# Patient Record
Sex: Female | Born: 1950
Health system: Southern US, Community
[De-identification: ages and names within clinical notes are randomized; demographics above are authoritative.]

## PROBLEM LIST (undated history)

## (undated) DIAGNOSIS — R7989 Other specified abnormal findings of blood chemistry: Secondary | ICD-10-CM

## (undated) DIAGNOSIS — E039 Hypothyroidism, unspecified: Secondary | ICD-10-CM

## (undated) DIAGNOSIS — R945 Abnormal results of liver function studies: Secondary | ICD-10-CM

## (undated) DIAGNOSIS — C801 Malignant (primary) neoplasm, unspecified: Secondary | ICD-10-CM

## (undated) DIAGNOSIS — Z5189 Encounter for other specified aftercare: Secondary | ICD-10-CM

## (undated) DIAGNOSIS — IMO0002 Reserved for concepts with insufficient information to code with codable children: Secondary | ICD-10-CM

## (undated) DIAGNOSIS — C4490 Unspecified malignant neoplasm of skin, unspecified: Secondary | ICD-10-CM

## (undated) DIAGNOSIS — C189 Malignant neoplasm of colon, unspecified: Secondary | ICD-10-CM

## (undated) DIAGNOSIS — I10 Essential (primary) hypertension: Secondary | ICD-10-CM

## (undated) DIAGNOSIS — K766 Portal hypertension: Secondary | ICD-10-CM

## (undated) DIAGNOSIS — M1990 Unspecified osteoarthritis, unspecified site: Secondary | ICD-10-CM

## (undated) DIAGNOSIS — R161 Splenomegaly, not elsewhere classified: Secondary | ICD-10-CM

## (undated) DIAGNOSIS — I219 Acute myocardial infarction, unspecified: Secondary | ICD-10-CM

## (undated) DIAGNOSIS — D509 Iron deficiency anemia, unspecified: Secondary | ICD-10-CM

## (undated) DIAGNOSIS — H269 Unspecified cataract: Secondary | ICD-10-CM

## (undated) DIAGNOSIS — I639 Cerebral infarction, unspecified: Secondary | ICD-10-CM

## (undated) DIAGNOSIS — K769 Liver disease, unspecified: Secondary | ICD-10-CM

## (undated) DIAGNOSIS — T7840XA Allergy, unspecified, initial encounter: Secondary | ICD-10-CM

## (undated) DIAGNOSIS — R011 Cardiac murmur, unspecified: Secondary | ICD-10-CM

## (undated) DIAGNOSIS — I8500 Esophageal varices without bleeding: Secondary | ICD-10-CM

## (undated) DIAGNOSIS — C180 Malignant neoplasm of cecum: Secondary | ICD-10-CM

## (undated) HISTORY — PX: COSMETIC SURGERY: SHX468

## (undated) HISTORY — DX: Iron deficiency anemia, unspecified: D50.9

## (undated) HISTORY — PX: COLON SURGERY: SHX602

## (undated) HISTORY — DX: Unspecified malignant neoplasm of skin, unspecified: C44.90

## (undated) HISTORY — DX: Essential (primary) hypertension: I10

## (undated) HISTORY — DX: Unspecified osteoarthritis, unspecified site: M19.90

## (undated) HISTORY — DX: Cardiac murmur, unspecified: R01.1

## (undated) HISTORY — DX: Malignant neoplasm of colon, unspecified: C18.9

## (undated) HISTORY — DX: Unspecified cataract: H26.9

## (undated) HISTORY — DX: Other specified abnormal findings of blood chemistry: R79.89

## (undated) HISTORY — DX: Portal hypertension: K76.6

## (undated) HISTORY — DX: Allergy, unspecified, initial encounter: T78.40XA

## (undated) HISTORY — DX: Malignant (primary) neoplasm, unspecified: C80.1

## (undated) HISTORY — DX: Abnormal results of liver function studies: R94.5

## (undated) HISTORY — DX: Malignant neoplasm of cecum: C18.0

## (undated) HISTORY — PX: LIVER SURGERY: SHX698

## (undated) HISTORY — PX: TIPS PROCEDURE: SHX808

## (undated) HISTORY — PX: EYE SURGERY: SHX253

## (undated) HISTORY — DX: Splenomegaly, not elsewhere classified: R16.1

## (undated) HISTORY — PX: ESOPHAGEAL VARICE LIGATION: SHX625

---

## 2002-12-26 ENCOUNTER — Encounter: Payer: Self-pay | Admitting: Gastroenterology

## 2002-12-26 ENCOUNTER — Encounter (INDEPENDENT_AMBULATORY_CARE_PROVIDER_SITE_OTHER): Payer: Self-pay | Admitting: Specialist

## 2002-12-26 ENCOUNTER — Ambulatory Visit (HOSPITAL_COMMUNITY): Admission: RE | Admit: 2002-12-26 | Discharge: 2002-12-26 | Payer: Self-pay | Admitting: Gastroenterology

## 2003-01-04 ENCOUNTER — Encounter: Payer: Self-pay | Admitting: General Surgery

## 2003-01-08 ENCOUNTER — Inpatient Hospital Stay (HOSPITAL_COMMUNITY): Admission: RE | Admit: 2003-01-08 | Discharge: 2003-01-13 | Payer: Self-pay | Admitting: General Surgery

## 2003-01-08 ENCOUNTER — Encounter (INDEPENDENT_AMBULATORY_CARE_PROVIDER_SITE_OTHER): Payer: Self-pay

## 2003-01-08 HISTORY — PX: HEMICOLECTOMY: SHX854

## 2003-02-01 ENCOUNTER — Encounter: Admission: RE | Admit: 2003-02-01 | Discharge: 2003-02-01 | Payer: Self-pay | Admitting: Oncology

## 2003-02-01 ENCOUNTER — Encounter (HOSPITAL_COMMUNITY): Payer: Self-pay | Admitting: Oncology

## 2003-02-02 ENCOUNTER — Encounter: Payer: Self-pay | Admitting: General Surgery

## 2003-02-02 ENCOUNTER — Ambulatory Visit (HOSPITAL_BASED_OUTPATIENT_CLINIC_OR_DEPARTMENT_OTHER): Admission: RE | Admit: 2003-02-02 | Discharge: 2003-02-02 | Payer: Self-pay | Admitting: General Surgery

## 2003-04-16 ENCOUNTER — Encounter (HOSPITAL_COMMUNITY): Payer: Self-pay | Admitting: Oncology

## 2003-04-16 ENCOUNTER — Ambulatory Visit (HOSPITAL_COMMUNITY): Admission: RE | Admit: 2003-04-16 | Discharge: 2003-04-16 | Payer: Self-pay | Admitting: Oncology

## 2003-06-19 ENCOUNTER — Ambulatory Visit (HOSPITAL_COMMUNITY): Admission: RE | Admit: 2003-06-19 | Discharge: 2003-06-19 | Payer: Self-pay | Admitting: Oncology

## 2003-06-19 ENCOUNTER — Encounter (HOSPITAL_COMMUNITY): Payer: Self-pay | Admitting: Oncology

## 2003-11-12 ENCOUNTER — Ambulatory Visit (HOSPITAL_COMMUNITY): Admission: RE | Admit: 2003-11-12 | Discharge: 2003-11-12 | Payer: Self-pay | Admitting: Oncology

## 2004-01-15 ENCOUNTER — Ambulatory Visit (HOSPITAL_COMMUNITY): Admission: RE | Admit: 2004-01-15 | Discharge: 2004-01-15 | Payer: Self-pay | Admitting: Gastroenterology

## 2004-01-29 ENCOUNTER — Ambulatory Visit (HOSPITAL_COMMUNITY): Admission: RE | Admit: 2004-01-29 | Discharge: 2004-01-29 | Payer: Self-pay | Admitting: Oncology

## 2004-01-29 ENCOUNTER — Encounter (INDEPENDENT_AMBULATORY_CARE_PROVIDER_SITE_OTHER): Payer: Self-pay | Admitting: Specialist

## 2004-01-30 ENCOUNTER — Ambulatory Visit (HOSPITAL_COMMUNITY): Admission: RE | Admit: 2004-01-30 | Discharge: 2004-01-30 | Payer: Self-pay | Admitting: Oncology

## 2004-02-28 ENCOUNTER — Encounter: Admission: RE | Admit: 2004-02-28 | Discharge: 2004-02-28 | Payer: Self-pay | Admitting: Oncology

## 2004-04-14 ENCOUNTER — Ambulatory Visit (HOSPITAL_COMMUNITY): Admission: RE | Admit: 2004-04-14 | Discharge: 2004-04-14 | Payer: Self-pay | Admitting: Gastroenterology

## 2004-08-11 ENCOUNTER — Encounter: Admission: RE | Admit: 2004-08-11 | Discharge: 2004-08-11 | Payer: Self-pay | Admitting: Gastroenterology

## 2004-09-26 ENCOUNTER — Ambulatory Visit: Payer: Self-pay | Admitting: Oncology

## 2004-11-18 ENCOUNTER — Ambulatory Visit (HOSPITAL_COMMUNITY): Admission: RE | Admit: 2004-11-18 | Discharge: 2004-11-18 | Payer: Self-pay | Admitting: Oncology

## 2004-12-18 ENCOUNTER — Ambulatory Visit: Payer: Self-pay | Admitting: Oncology

## 2005-02-11 ENCOUNTER — Ambulatory Visit: Payer: Self-pay | Admitting: Oncology

## 2005-04-07 ENCOUNTER — Encounter: Admission: RE | Admit: 2005-04-07 | Discharge: 2005-04-07 | Payer: Self-pay | Admitting: Oncology

## 2005-04-22 ENCOUNTER — Ambulatory Visit: Payer: Self-pay | Admitting: Oncology

## 2005-05-04 ENCOUNTER — Ambulatory Visit (HOSPITAL_COMMUNITY): Admission: RE | Admit: 2005-05-04 | Discharge: 2005-05-04 | Payer: Self-pay | Admitting: Oncology

## 2005-06-22 ENCOUNTER — Ambulatory Visit: Payer: Self-pay | Admitting: Oncology

## 2005-09-11 ENCOUNTER — Ambulatory Visit: Payer: Self-pay | Admitting: Oncology

## 2005-11-10 ENCOUNTER — Ambulatory Visit: Payer: Self-pay | Admitting: Oncology

## 2005-11-11 ENCOUNTER — Ambulatory Visit (HOSPITAL_COMMUNITY): Admission: RE | Admit: 2005-11-11 | Discharge: 2005-11-11 | Payer: Self-pay | Admitting: Oncology

## 2006-01-13 ENCOUNTER — Ambulatory Visit: Payer: Self-pay | Admitting: Oncology

## 2006-03-12 ENCOUNTER — Ambulatory Visit: Payer: Self-pay | Admitting: Oncology

## 2006-05-12 ENCOUNTER — Ambulatory Visit: Payer: Self-pay | Admitting: Oncology

## 2006-05-12 ENCOUNTER — Encounter: Admission: RE | Admit: 2006-05-12 | Discharge: 2006-05-12 | Payer: Self-pay | Admitting: Oncology

## 2006-05-14 LAB — COMPREHENSIVE METABOLIC PANEL
ALT: 26 U/L (ref 0–40)
AST: 35 U/L (ref 0–37)
Alkaline Phosphatase: 115 U/L (ref 39–117)
Calcium: 8.7 mg/dL (ref 8.4–10.5)
Chloride: 107 mEq/L (ref 96–112)
Creatinine, Ser: 0.83 mg/dL (ref 0.40–1.20)

## 2006-05-14 LAB — CBC WITH DIFFERENTIAL/PLATELET
BASO%: 0.8 % (ref 0.0–2.0)
EOS%: 2.5 % (ref 0.0–7.0)
HCT: 30.9 % — ABNORMAL LOW (ref 34.8–46.6)
MCH: 30.2 pg (ref 26.0–34.0)
MCHC: 34.1 g/dL (ref 32.0–36.0)
MONO%: 8.5 % (ref 0.0–13.0)
NEUT%: 57.9 % (ref 39.6–76.8)
RDW: 15.3 % — ABNORMAL HIGH (ref 11.3–14.5)
lymph#: 0.7 10*3/uL — ABNORMAL LOW (ref 0.9–3.3)

## 2006-05-14 LAB — LACTATE DEHYDROGENASE: LDH: 261 U/L — ABNORMAL HIGH (ref 94–250)

## 2006-05-25 ENCOUNTER — Ambulatory Visit (HOSPITAL_COMMUNITY): Admission: RE | Admit: 2006-05-25 | Discharge: 2006-05-25 | Payer: Self-pay | Admitting: Oncology

## 2006-07-09 ENCOUNTER — Ambulatory Visit: Payer: Self-pay | Admitting: Oncology

## 2006-07-29 ENCOUNTER — Inpatient Hospital Stay (HOSPITAL_COMMUNITY): Admission: EM | Admit: 2006-07-29 | Discharge: 2006-08-01 | Payer: Self-pay | Admitting: Emergency Medicine

## 2006-08-24 ENCOUNTER — Inpatient Hospital Stay (HOSPITAL_COMMUNITY): Admission: AD | Admit: 2006-08-24 | Discharge: 2006-08-27 | Payer: Self-pay | Admitting: Gastroenterology

## 2006-09-09 ENCOUNTER — Ambulatory Visit: Payer: Self-pay | Admitting: Oncology

## 2006-09-20 ENCOUNTER — Ambulatory Visit (HOSPITAL_COMMUNITY): Admission: RE | Admit: 2006-09-20 | Discharge: 2006-09-20 | Payer: Self-pay | Admitting: Gastroenterology

## 2006-10-29 ENCOUNTER — Ambulatory Visit (HOSPITAL_COMMUNITY): Admission: RE | Admit: 2006-10-29 | Discharge: 2006-10-29 | Payer: Self-pay | Admitting: Gastroenterology

## 2006-11-04 ENCOUNTER — Ambulatory Visit: Payer: Self-pay | Admitting: Oncology

## 2007-01-05 ENCOUNTER — Ambulatory Visit: Payer: Self-pay | Admitting: Oncology

## 2007-01-10 LAB — CBC WITH DIFFERENTIAL/PLATELET
Basophils Absolute: 0 10*3/uL (ref 0.0–0.1)
Eosinophils Absolute: 0.1 10*3/uL (ref 0.0–0.5)
HCT: 31.6 % — ABNORMAL LOW (ref 34.8–46.6)
HGB: 10.8 g/dL — ABNORMAL LOW (ref 11.6–15.9)
MONO#: 0.2 10*3/uL (ref 0.1–0.9)
NEUT#: 1.4 10*3/uL — ABNORMAL LOW (ref 1.5–6.5)
NEUT%: 54.4 % (ref 39.6–76.8)
RDW: 16.6 % — ABNORMAL HIGH (ref 11.3–14.5)
WBC: 2.5 10*3/uL — ABNORMAL LOW (ref 3.9–10.0)
lymph#: 0.9 10*3/uL (ref 0.9–3.3)

## 2007-01-10 LAB — COMPREHENSIVE METABOLIC PANEL
ALT: 32 U/L (ref 0–35)
AST: 34 U/L (ref 0–37)
Albumin: 3.9 g/dL (ref 3.5–5.2)
BUN: 11 mg/dL (ref 6–23)
CO2: 26 mEq/L (ref 19–32)
Calcium: 9 mg/dL (ref 8.4–10.5)
Chloride: 108 mEq/L (ref 96–112)
Creatinine, Ser: 0.84 mg/dL (ref 0.40–1.20)
Potassium: 3.9 mEq/L (ref 3.5–5.3)

## 2007-01-10 LAB — LACTATE DEHYDROGENASE: LDH: 230 U/L (ref 94–250)

## 2007-03-02 ENCOUNTER — Ambulatory Visit: Payer: Self-pay | Admitting: Oncology

## 2007-05-12 ENCOUNTER — Ambulatory Visit: Payer: Self-pay | Admitting: Oncology

## 2007-05-16 LAB — COMPREHENSIVE METABOLIC PANEL
AST: 31 U/L (ref 0–37)
Albumin: 4.2 g/dL (ref 3.5–5.2)
Alkaline Phosphatase: 91 U/L (ref 39–117)
BUN: 17 mg/dL (ref 6–23)
Chloride: 106 mEq/L (ref 96–112)
Creatinine, Ser: 0.79 mg/dL (ref 0.40–1.20)
Glucose, Bld: 68 mg/dL — ABNORMAL LOW (ref 70–99)
Total Bilirubin: 0.6 mg/dL (ref 0.3–1.2)

## 2007-05-16 LAB — CBC WITH DIFFERENTIAL/PLATELET
BASO%: 0.6 % (ref 0.0–2.0)
Basophils Absolute: 0 10*3/uL (ref 0.0–0.1)
EOS%: 3.3 % (ref 0.0–7.0)
Eosinophils Absolute: 0.1 10*3/uL (ref 0.0–0.5)
HGB: 12.1 g/dL (ref 11.6–15.9)
LYMPH%: 29.6 % (ref 14.0–48.0)
MCH: 31.5 pg (ref 26.0–34.0)
MCHC: 34.2 g/dL (ref 32.0–36.0)
MCV: 92 fL (ref 81.0–101.0)
MONO%: 6.8 % (ref 0.0–13.0)
NEUT#: 1.4 10*3/uL — ABNORMAL LOW (ref 1.5–6.5)
NEUT%: 59.7 % (ref 39.6–76.8)
Platelets: 122 10*3/uL — ABNORMAL LOW (ref 145–400)
RBC: 3.86 10*6/uL (ref 3.70–5.32)
lymph#: 0.7 10*3/uL — ABNORMAL LOW (ref 0.9–3.3)

## 2007-05-16 LAB — CEA: CEA: 2.2 ng/mL (ref 0.0–5.0)

## 2007-06-06 ENCOUNTER — Ambulatory Visit (HOSPITAL_COMMUNITY): Admission: RE | Admit: 2007-06-06 | Discharge: 2007-06-06 | Payer: Self-pay | Admitting: Oncology

## 2007-06-07 ENCOUNTER — Encounter: Admission: RE | Admit: 2007-06-07 | Discharge: 2007-06-07 | Payer: Self-pay | Admitting: Oncology

## 2007-07-01 ENCOUNTER — Ambulatory Visit: Payer: Self-pay | Admitting: Oncology

## 2007-09-15 ENCOUNTER — Ambulatory Visit: Payer: Self-pay | Admitting: Oncology

## 2007-09-19 LAB — CEA: CEA: 2.7 ng/mL (ref 0.0–5.0)

## 2007-09-19 LAB — CBC WITH DIFFERENTIAL/PLATELET
BASO%: 0.8 % (ref 0.0–2.0)
Eosinophils Absolute: 0.1 10*3/uL (ref 0.0–0.5)
HCT: 33.1 % — ABNORMAL LOW (ref 34.8–46.6)
MCHC: 35.3 g/dL (ref 32.0–36.0)
MONO#: 0.2 10*3/uL (ref 0.1–0.9)
NEUT#: 1.3 10*3/uL — ABNORMAL LOW (ref 1.5–6.5)
NEUT%: 57.6 % (ref 39.6–76.8)
WBC: 2.2 10*3/uL — ABNORMAL LOW (ref 3.9–10.0)
lymph#: 0.7 10*3/uL — ABNORMAL LOW (ref 0.9–3.3)

## 2007-09-19 LAB — COMPREHENSIVE METABOLIC PANEL
ALT: 26 U/L (ref 0–35)
CO2: 29 mEq/L (ref 19–32)
Calcium: 8.9 mg/dL (ref 8.4–10.5)
Chloride: 105 mEq/L (ref 96–112)
Creatinine, Ser: 0.86 mg/dL (ref 0.40–1.20)
Glucose, Bld: 74 mg/dL (ref 70–99)
Sodium: 142 mEq/L (ref 135–145)
Total Protein: 7.3 g/dL (ref 6.0–8.3)

## 2007-09-19 LAB — LACTATE DEHYDROGENASE: LDH: 203 U/L (ref 94–250)

## 2007-11-14 ENCOUNTER — Ambulatory Visit (HOSPITAL_COMMUNITY): Admission: RE | Admit: 2007-11-14 | Discharge: 2007-11-14 | Payer: Self-pay | Admitting: General Surgery

## 2008-01-23 DIAGNOSIS — K766 Portal hypertension: Secondary | ICD-10-CM | POA: Insufficient documentation

## 2008-03-14 ENCOUNTER — Ambulatory Visit: Payer: Self-pay | Admitting: Oncology

## 2008-03-19 LAB — CEA: CEA: 2.1 ng/mL (ref 0.0–5.0)

## 2008-03-19 LAB — COMPREHENSIVE METABOLIC PANEL
Albumin: 4 g/dL (ref 3.5–5.2)
CO2: 26 mEq/L (ref 19–32)
Glucose, Bld: 83 mg/dL (ref 70–99)
Potassium: 3.9 mEq/L (ref 3.5–5.3)
Sodium: 140 mEq/L (ref 135–145)
Total Protein: 7.2 g/dL (ref 6.0–8.3)

## 2008-03-19 LAB — CBC WITH DIFFERENTIAL/PLATELET
Eosinophils Absolute: 0.1 10*3/uL (ref 0.0–0.5)
MONO#: 0.2 10*3/uL (ref 0.1–0.9)
NEUT#: 1.5 10*3/uL (ref 1.5–6.5)
Platelets: 105 10*3/uL — ABNORMAL LOW (ref 145–400)
RBC: 3.62 10*6/uL — ABNORMAL LOW (ref 3.70–5.32)
RDW: 14.9 % — ABNORMAL HIGH (ref 11.3–14.5)
WBC: 2.5 10*3/uL — ABNORMAL LOW (ref 3.9–10.0)

## 2008-03-19 LAB — LACTATE DEHYDROGENASE: LDH: 200 U/L (ref 94–250)

## 2008-06-05 ENCOUNTER — Ambulatory Visit (HOSPITAL_COMMUNITY): Admission: RE | Admit: 2008-06-05 | Discharge: 2008-06-05 | Payer: Self-pay | Admitting: Oncology

## 2008-06-13 ENCOUNTER — Encounter: Admission: RE | Admit: 2008-06-13 | Discharge: 2008-06-13 | Payer: Self-pay | Admitting: Obstetrics and Gynecology

## 2008-07-02 ENCOUNTER — Ambulatory Visit (HOSPITAL_COMMUNITY): Admission: RE | Admit: 2008-07-02 | Discharge: 2008-07-02 | Payer: Self-pay | Admitting: Gastroenterology

## 2008-10-12 ENCOUNTER — Ambulatory Visit: Payer: Self-pay | Admitting: Oncology

## 2008-10-15 LAB — COMPREHENSIVE METABOLIC PANEL
Albumin: 4.1 g/dL (ref 3.5–5.2)
Alkaline Phosphatase: 105 U/L (ref 39–117)
BUN: 13 mg/dL (ref 6–23)
Calcium: 8.8 mg/dL (ref 8.4–10.5)
Glucose, Bld: 72 mg/dL (ref 70–99)
Potassium: 3.9 mEq/L (ref 3.5–5.3)

## 2008-10-15 LAB — CBC WITH DIFFERENTIAL/PLATELET
Basophils Absolute: 0 10*3/uL (ref 0.0–0.1)
EOS%: 5.1 % (ref 0.0–7.0)
HCT: 34.5 % — ABNORMAL LOW (ref 34.8–46.6)
HGB: 11.8 g/dL (ref 11.6–15.9)
LYMPH%: 28 % (ref 14.0–48.0)
MCH: 32 pg (ref 26.0–34.0)
MCV: 93.4 fL (ref 81.0–101.0)
NEUT%: 59 % (ref 39.6–76.8)
Platelets: 100 10*3/uL — ABNORMAL LOW (ref 145–400)
lymph#: 0.6 10*3/uL — ABNORMAL LOW (ref 0.9–3.3)

## 2009-05-21 ENCOUNTER — Ambulatory Visit: Payer: Self-pay | Admitting: Oncology

## 2009-05-23 ENCOUNTER — Ambulatory Visit (HOSPITAL_COMMUNITY): Admission: RE | Admit: 2009-05-23 | Discharge: 2009-05-23 | Payer: Self-pay | Admitting: Oncology

## 2009-05-23 LAB — CBC WITH DIFFERENTIAL/PLATELET
BASO%: 0.3 % (ref 0.0–2.0)
Basophils Absolute: 0 10*3/uL (ref 0.0–0.1)
EOS%: 3.5 % (ref 0.0–7.0)
Eosinophils Absolute: 0.1 10*3/uL (ref 0.0–0.5)
HCT: 35.2 % (ref 34.8–46.6)
HGB: 12.2 g/dL (ref 11.6–15.9)
LYMPH%: 28.9 % (ref 14.0–49.7)
MCH: 32.2 pg (ref 25.1–34.0)
MCHC: 34.6 g/dL (ref 31.5–36.0)
MCV: 93 fL (ref 79.5–101.0)
MONO#: 0.1 10*3/uL (ref 0.1–0.9)
MONO%: 7.3 % (ref 0.0–14.0)
NEUT#: 1.1 10*3/uL — ABNORMAL LOW (ref 1.5–6.5)
NEUT%: 60 % (ref 38.4–76.8)
Platelets: 94 10*3/uL — ABNORMAL LOW (ref 145–400)
RBC: 3.78 10*6/uL (ref 3.70–5.45)
RDW: 14.9 % — ABNORMAL HIGH (ref 11.2–14.5)
WBC: 1.9 10*3/uL — ABNORMAL LOW (ref 3.9–10.3)
lymph#: 0.5 10*3/uL — ABNORMAL LOW (ref 0.9–3.3)

## 2009-05-23 LAB — COMPREHENSIVE METABOLIC PANEL
ALT: 21 U/L (ref 0–35)
AST: 30 U/L (ref 0–37)
Alkaline Phosphatase: 83 U/L (ref 39–117)
Potassium: 3.9 mEq/L (ref 3.5–5.3)
Sodium: 141 mEq/L (ref 135–145)
Total Bilirubin: 0.7 mg/dL (ref 0.3–1.2)
Total Protein: 7.3 g/dL (ref 6.0–8.3)

## 2009-05-23 LAB — CEA: CEA: 2.7 ng/mL (ref 0.0–5.0)

## 2009-06-26 ENCOUNTER — Ambulatory Visit (HOSPITAL_COMMUNITY): Admission: RE | Admit: 2009-06-26 | Discharge: 2009-06-26 | Payer: Self-pay | Admitting: Gastroenterology

## 2009-07-16 ENCOUNTER — Encounter: Admission: RE | Admit: 2009-07-16 | Discharge: 2009-07-16 | Payer: Self-pay | Admitting: Oncology

## 2009-11-25 ENCOUNTER — Ambulatory Visit: Payer: Self-pay | Admitting: Oncology

## 2009-11-28 LAB — CBC WITH DIFFERENTIAL/PLATELET
BASO%: 0.7 % (ref 0.0–2.0)
Basophils Absolute: 0 10*3/uL (ref 0.0–0.1)
HCT: 33.6 % — ABNORMAL LOW (ref 34.8–46.6)
HGB: 11.3 g/dL — ABNORMAL LOW (ref 11.6–15.9)
MONO#: 0.2 10*3/uL (ref 0.1–0.9)
NEUT#: 1.1 10*3/uL — ABNORMAL LOW (ref 1.5–6.5)
NEUT%: 58.5 % (ref 38.4–76.8)
RDW: 14.5 % (ref 11.2–14.5)
WBC: 2 10*3/uL — ABNORMAL LOW (ref 3.9–10.3)
lymph#: 0.6 10*3/uL — ABNORMAL LOW (ref 0.9–3.3)

## 2009-11-28 LAB — COMPREHENSIVE METABOLIC PANEL
ALT: 24 U/L (ref 0–35)
AST: 37 U/L (ref 0–37)
Albumin: 3.7 g/dL (ref 3.5–5.2)
BUN: 11 mg/dL (ref 6–23)
CO2: 29 mEq/L (ref 19–32)
Calcium: 9 mg/dL (ref 8.4–10.5)
Chloride: 107 mEq/L (ref 96–112)
Creatinine, Ser: 0.85 mg/dL (ref 0.40–1.20)
Potassium: 3.5 mEq/L (ref 3.5–5.3)

## 2009-11-28 LAB — LACTATE DEHYDROGENASE: LDH: 221 U/L (ref 94–250)

## 2010-05-27 ENCOUNTER — Ambulatory Visit: Payer: Self-pay | Admitting: Oncology

## 2010-05-29 LAB — CBC WITH DIFFERENTIAL/PLATELET
Basophils Absolute: 0 10*3/uL (ref 0.0–0.1)
EOS%: 3.2 % (ref 0.0–7.0)
HCT: 33.2 % — ABNORMAL LOW (ref 34.8–46.6)
HGB: 11.4 g/dL — ABNORMAL LOW (ref 11.6–15.9)
MCH: 32.3 pg (ref 25.1–34.0)
MONO#: 0.1 10*3/uL (ref 0.1–0.9)
NEUT#: 1.2 10*3/uL — ABNORMAL LOW (ref 1.5–6.5)
NEUT%: 66.9 % (ref 38.4–76.8)
RDW: 15.4 % — ABNORMAL HIGH (ref 11.2–14.5)
WBC: 1.7 10*3/uL — ABNORMAL LOW (ref 3.9–10.3)
lymph#: 0.4 10*3/uL — ABNORMAL LOW (ref 0.9–3.3)

## 2010-05-29 LAB — COMPREHENSIVE METABOLIC PANEL
ALT: 16 U/L (ref 0–35)
AST: 25 U/L (ref 0–37)
Albumin: 4 g/dL (ref 3.5–5.2)
BUN: 12 mg/dL (ref 6–23)
CO2: 22 mEq/L (ref 19–32)
Calcium: 8.8 mg/dL (ref 8.4–10.5)
Chloride: 108 mEq/L (ref 96–112)
Creatinine, Ser: 0.81 mg/dL (ref 0.40–1.20)
Potassium: 3.8 mEq/L (ref 3.5–5.3)

## 2010-05-29 LAB — LACTATE DEHYDROGENASE: LDH: 168 U/L (ref 94–250)

## 2010-05-30 ENCOUNTER — Ambulatory Visit (HOSPITAL_COMMUNITY): Admission: RE | Admit: 2010-05-30 | Discharge: 2010-05-30 | Payer: Self-pay | Admitting: Oncology

## 2010-06-12 DIAGNOSIS — I8500 Esophageal varices without bleeding: Secondary | ICD-10-CM | POA: Insufficient documentation

## 2010-07-17 ENCOUNTER — Encounter: Admission: RE | Admit: 2010-07-17 | Discharge: 2010-07-17 | Payer: Self-pay | Admitting: Oncology

## 2010-11-26 ENCOUNTER — Ambulatory Visit: Payer: Self-pay | Admitting: Oncology

## 2010-11-27 LAB — CBC WITH DIFFERENTIAL/PLATELET
BASO%: 0.5 % (ref 0.0–2.0)
Basophils Absolute: 0 10*3/uL (ref 0.0–0.1)
EOS%: 3.4 % (ref 0.0–7.0)
Eosinophils Absolute: 0.1 10*3/uL (ref 0.0–0.5)
HCT: 32.9 % — ABNORMAL LOW (ref 34.8–46.6)
HGB: 11.1 g/dL — ABNORMAL LOW (ref 11.6–15.9)
LYMPH%: 27.2 % (ref 14.0–49.7)
MCH: 31.8 pg (ref 25.1–34.0)
MCHC: 33.9 g/dL (ref 31.5–36.0)
MCV: 93.9 fL (ref 79.5–101.0)
MONO#: 0.1 10*3/uL (ref 0.1–0.9)
MONO%: 6.9 % (ref 0.0–14.0)
NEUT#: 1.2 10*3/uL — ABNORMAL LOW (ref 1.5–6.5)
NEUT%: 62 % (ref 38.4–76.8)
Platelets: 82 10*3/uL — ABNORMAL LOW (ref 145–400)
RBC: 3.5 10*6/uL — ABNORMAL LOW (ref 3.70–5.45)
RDW: 14.9 % — ABNORMAL HIGH (ref 11.2–14.5)
WBC: 1.9 10*3/uL — ABNORMAL LOW (ref 3.9–10.3)
lymph#: 0.5 10*3/uL — ABNORMAL LOW (ref 0.9–3.3)

## 2010-11-27 LAB — COMPREHENSIVE METABOLIC PANEL
ALT: 29 U/L (ref 0–35)
AST: 38 U/L — ABNORMAL HIGH (ref 0–37)
Albumin: 3.6 g/dL (ref 3.5–5.2)
Alkaline Phosphatase: 83 U/L (ref 39–117)
BUN: 12 mg/dL (ref 6–23)
CO2: 29 mEq/L (ref 19–32)
Calcium: 9 mg/dL (ref 8.4–10.5)
Chloride: 107 mEq/L (ref 96–112)
Creatinine, Ser: 1.01 mg/dL (ref 0.40–1.20)
Glucose, Bld: 92 mg/dL (ref 70–99)
Potassium: 4 mEq/L (ref 3.5–5.3)
Sodium: 142 mEq/L (ref 135–145)
Total Bilirubin: 0.7 mg/dL (ref 0.3–1.2)
Total Protein: 7.2 g/dL (ref 6.0–8.3)

## 2010-11-27 LAB — CEA: CEA: 2.5 ng/mL (ref 0.0–5.0)

## 2010-11-27 LAB — LACTATE DEHYDROGENASE: LDH: 210 U/L (ref 94–250)

## 2010-12-19 LAB — BASIC METABOLIC PANEL
Calcium: 8.5 mg/dL (ref 8.4–10.5)
Creatinine, Ser: 1.2 mg/dL (ref 0.4–1.2)
GFR calc Af Amer: 56 mL/min — ABNORMAL LOW (ref >60)
GFR calc non Af Amer: 46 mL/min — ABNORMAL LOW (ref >60)
Sodium: 125 mEq/L — ABNORMAL LOW (ref 135–145)

## 2010-12-19 LAB — BLOOD GAS, ARTERIAL
Acid-base deficit: 2 mmol/L (ref 0.0–2.0)
Drawn by: 229971
O2 Content: 2 L/min
pCO2 arterial: 24.8 mmHg — ABNORMAL LOW (ref 35.0–45.0)
pH, Arterial: 7.509 — ABNORMAL HIGH (ref 7.350–7.400)
pO2, Arterial: 65.7 mmHg — ABNORMAL LOW (ref 80.0–100.0)

## 2010-12-19 LAB — DIFFERENTIAL
Basophils Relative: 0 % (ref 0–1)
Eosinophils Absolute: 0 10*3/uL (ref 0.0–0.7)
Eosinophils Relative: 0 % (ref 0–5)
Lymphocytes Relative: 3 % — ABNORMAL LOW (ref 12–46)
Neutrophils Relative %: 92 % — ABNORMAL HIGH (ref 43–77)

## 2010-12-19 LAB — CBC
Hemoglobin: 15.8 g/dL — ABNORMAL HIGH (ref 12.0–15.0)
MCH: 31.3 pg (ref 26.0–34.0)
Platelets: 68 10*3/uL — ABNORMAL LOW (ref 150–400)
RBC: 5.04 MIL/uL (ref 3.87–5.11)
WBC: 6.5 10*3/uL (ref 4.0–10.5)

## 2010-12-19 LAB — PROCALCITONIN: Procalcitonin: 13.46 ng/mL

## 2010-12-20 ENCOUNTER — Inpatient Hospital Stay (HOSPITAL_COMMUNITY)
Admission: EM | Admit: 2010-12-20 | Discharge: 2010-12-25 | DRG: 089 | Disposition: A | Discharge status: Home / Self Care | Payer: BC Managed Care – HMO | Attending: Internal Medicine | Admitting: Internal Medicine

## 2010-12-20 DIAGNOSIS — D61818 Other pancytopenia: Secondary | ICD-10-CM | POA: Diagnosis present

## 2010-12-20 DIAGNOSIS — K7460 Unspecified cirrhosis of liver: Secondary | ICD-10-CM | POA: Diagnosis present

## 2010-12-20 DIAGNOSIS — R509 Fever, unspecified: Secondary | ICD-10-CM | POA: Diagnosis present

## 2010-12-20 DIAGNOSIS — E039 Hypothyroidism, unspecified: Secondary | ICD-10-CM | POA: Diagnosis present

## 2010-12-20 DIAGNOSIS — D649 Anemia, unspecified: Secondary | ICD-10-CM | POA: Diagnosis present

## 2010-12-20 DIAGNOSIS — K560 Paralytic ileus: Secondary | ICD-10-CM | POA: Diagnosis present

## 2010-12-20 DIAGNOSIS — E669 Obesity, unspecified: Secondary | ICD-10-CM | POA: Diagnosis present

## 2010-12-20 DIAGNOSIS — J189 Pneumonia, unspecified organism: Principal | ICD-10-CM | POA: Diagnosis present

## 2010-12-20 DIAGNOSIS — D6959 Other secondary thrombocytopenia: Secondary | ICD-10-CM | POA: Diagnosis present

## 2010-12-20 DIAGNOSIS — A088 Other specified intestinal infections: Secondary | ICD-10-CM | POA: Diagnosis present

## 2010-12-20 LAB — PROTIME-INR
INR: 1.44 (ref 0.00–1.49)
INR: 1.65 — ABNORMAL HIGH (ref 0.00–1.49)
Prothrombin Time: 19.7 seconds — ABNORMAL HIGH (ref 11.6–15.2)

## 2010-12-20 LAB — CREATININE, URINE, RANDOM: Creatinine, Urine: 63.1 mg/dL

## 2010-12-20 LAB — URINALYSIS, ROUTINE W REFLEX MICROSCOPIC
Ketones, ur: NEGATIVE mg/dL
Protein, ur: NEGATIVE mg/dL
Urine Glucose, Fasting: NEGATIVE mg/dL
Urobilinogen, UA: 0.2 mg/dL (ref 0.0–1.0)

## 2010-12-20 LAB — SODIUM, URINE, RANDOM: Sodium, Ur: <15 mEq/L

## 2010-12-20 LAB — MAGNESIUM: Magnesium: 2.1 mg/dL (ref 1.5–2.5)

## 2010-12-20 LAB — DIFFERENTIAL
Basophils Absolute: 0 10*3/uL (ref 0.0–0.1)
Basophils Relative: 0 % (ref 0–1)
Eosinophils Absolute: 0 10*3/uL (ref 0.0–0.7)
Lymphocytes Relative: 3 % — ABNORMAL LOW (ref 12–46)
Lymphs Abs: 0.3 10*3/uL — ABNORMAL LOW (ref 0.7–4.0)
Monocytes Absolute: 0.7 10*3/uL (ref 0.1–1.0)
Neutro Abs: 7.5 10*3/uL (ref 1.7–7.7)

## 2010-12-20 LAB — PHOSPHORUS: Phosphorus: 2.4 mg/dL (ref 2.3–4.6)

## 2010-12-20 LAB — COMPREHENSIVE METABOLIC PANEL
ALT: 16 U/L (ref 0–35)
BUN: 19 mg/dL (ref 6–23)
CO2: 23 mEq/L (ref 19–32)
Calcium: 8.2 mg/dL — ABNORMAL LOW (ref 8.4–10.5)
Creatinine, Ser: 1.07 mg/dL (ref 0.4–1.2)
GFR calc non Af Amer: 52 mL/min — ABNORMAL LOW (ref >60)
Glucose, Bld: 103 mg/dL — ABNORMAL HIGH (ref 70–99)
Sodium: 130 mEq/L — ABNORMAL LOW (ref 135–145)
Total Protein: 6.2 g/dL (ref 6.0–8.3)

## 2010-12-20 LAB — URINE MICROSCOPIC-ADD ON

## 2010-12-20 LAB — FECAL LACTOFERRIN, QUANT: Fecal Lactoferrin: POSITIVE

## 2010-12-20 LAB — APTT: aPTT: 39 seconds — ABNORMAL HIGH (ref 24–37)

## 2010-12-20 LAB — CBC
Hemoglobin: 10.4 g/dL — ABNORMAL LOW (ref 12.0–15.0)
MCH: 30.9 pg (ref 26.0–34.0)
MCHC: 35.3 g/dL (ref 30.0–36.0)
Platelets: 73 10*3/uL — ABNORMAL LOW (ref 150–400)
RDW: 14.1 % (ref 11.5–15.5)

## 2010-12-20 LAB — MRSA PCR SCREENING: MRSA by PCR: NEGATIVE

## 2010-12-20 NOTE — H&P (Signed)
[name], [name]                  ACCOUNT NO.:  [id number]  MEDICAL RECORD NO.:  [id number]          PATIENT TYPE:  EMS  LOCATION:  ED                           FACILITY:  [facility]  PHYSICIAN:  Michiel Cowboy, MDDATE OF BIRTH:  1951-04-17  DATE OF ADMISSION:  12/19/2010 DATE OF DISCHARGE:                             HISTORY & PHYSICAL   PRIMARY. CARE PROVIDER:  Currently none but the patient has oncologist Dr. Arline Asp and hepatologist Dr. Agustin Cree at [facility].  CHIEF COMPLAINT:  Nausea, vomiting, fever, cough.  HISTORY OF PRESENT ILLNESS:  The patient is a 60 year old female with history of colon cancer in 2004, status post chemotherapy and I believe also resection  resulting in cirrhosis and chronic thrombocytopenia, all thought to be related to her chemotherapy.  The patient seems to have been free of cancer per her.  She was at her baseline of health about 2 weeks ago when she started developing cold-like symptoms of cough, runny nose or throat.  This lasted for few days and started to improve.  Then she developed nausea.  She was evaluated for this at the urgent care, was given something for nausea and went home and started to develop diarrhea which has continued and low-grade fevers on and off.  She came back to urgent care and had another x-ray done which was suspicious for pneumonia at which point we send her to emergency department.  In ED, a chest x-ray was done which showed left lower lobe pneumonia at which point [facility] was called for admission.  The patient is currently feeling somewhat better but still has some shortness of breath and increase oxygen requirement.  She has low-grade temperature of 101.1.  Initially, her blood pressure was in 130s over 80s but now have trended down to mid 90s over 80s.  She denies having any blood in her stool.  She does endorse low-grade fevers, nausea, vomiting and diarrhea which continues and nausea and vomiting part  started to improve.  She states that her liver functions were checked at least in November.  Her platelets have been always low since her chemotherapy.  REVIEW OF SYSTEMS:  Otherwise unremarkable.  PAST MEDICAL HISTORY:  Significant for: 1. History of colon cancer in 2004, status post chemo. 2. History of cirrhosis with varices, status post multiple banding     procedures in the past, last one was apparently done in August     2010. 3. History of thrombocytopenia which is chronic.  SOCIAL HISTORY:  The patient never smoked or drink, does not abuse drugs.  FAMILY HISTORY:  Significant for coronary artery disease in both parents who are both alive at this point.  ALLERGIES:  IV CONTRAST, causes her to have hives.  MEDICATIONS:  Propranolol 10 mg twice a day and Prilosec 20 mg daily.  PHYSICAL EXAMINATION:  VITAL SIGNS:  Temperature 101.1, blood pressure 132/81 initially and now down to 97/80, pulse initially 107 and now down to 88, respirations 18, satting 99% on room air and now 93% on 4 L. GENERAL:  The patient appears to be in no acute distress, resting in  bed. HEENT:  Head nontraumatic.  Dry mucous membranes. LUNGS:  There are bronchial air sounds on the left base.  I did not appreciate much of significant crackles.  No wheezes. HEART:  Regular rate and rhythm.  No murmurs appreciated. ABDOMEN:  Soft, slightly obese but nondistended. EXTREMITIES:  Lower extremities without clubbing, cyanosis or edema. NEUROLOGIC:  Neurologically grossly intact.  LABORATORY DATA:  White blood cell count 6.5, hemoglobin 15.8.  Her platelets are 68.  Sodium 125, potassium 2.8, creatinine 1.2.  Lactic acid 1.8.  Procalcitonin level of 13.4.  Chest x-ray done showed left lower lobe pneumonia.  ASSESSMENT AND PLAN:  This is a 60 year old female with nausea, vomiting, diarrhea and left lower lobe pneumonia, with history of cirrhosis and colon cancer: 1. Pneumonia.  We will cover Rocephin and  azithromycin, this could be     a typical pneumonia.  We will order Legionella serologies given     diarrhea. 2. Nausea, vomiting, diarrhea, could have been if this was a viral     illness to begin with which has now developed a second etiology     such as pneumonia.  She has not had any exposure to antibiotics but     since she has had a lot of exposure to medical system, we will     check for C diff.  I thought this was less likely but we will also     check stool studies.  Check a KUB. 3. Cirrhosis.  We will check LFTs.  Follow up her INR, platelets and     sodium. 4. Thrombocytopenia.  Her platelets last time were checked in October     2007 here in the system that I could see, I am sure she had had     other counts done.  Unfortunately I do not have access to any other     information at this point.  Continue to follow for now.  The     patient is aware that she has history of thrombocytopenia. 5. Low sodium.  This could be part of dehydration but also low sodium     in case of cirrhosis is a possibility as well.  Normally her sodium     has been actually fairly good but again I have to rely on numbers     from December 2008.  We will follow sodium levels to make sure we     do not overcorrect too rapidly.  We will also obtain urine and     lytes.  At this point, I really think that this was all because of     dehydration. 6. Low potassium, likely related to her recent diarrhea.  We will     replace. 7. Prophylaxis.  SCDs and Protonix.     Michiel Cowboy, MD     AVD/MEDQ  D:  12/19/2010  T:  12/20/2010  Job:  [id number]  cc:   Samul Dada, M.D. Fax: [phone]  Electronically Signed by Therisa Doyne MD on 12/20/2010 06:25:58 AM

## 2010-12-21 LAB — BASIC METABOLIC PANEL
BUN: 10 mg/dL (ref 6–23)
BUN: 9 mg/dL (ref 6–23)
Calcium: 7.7 mg/dL — ABNORMAL LOW (ref 8.4–10.5)
Chloride: 107 mEq/L (ref 96–112)
Creatinine, Ser: 0.78 mg/dL (ref 0.4–1.2)
Creatinine, Ser: 0.77 mg/dL (ref 0.4–1.2)
GFR calc non Af Amer: >60 mL/min (ref >60)
Glucose, Bld: 94 mg/dL (ref 70–99)
Glucose, Bld: 114 mg/dL — ABNORMAL HIGH (ref 70–99)

## 2010-12-21 LAB — URINE CULTURE: Culture: NO GROWTH

## 2010-12-21 LAB — CBC
HCT: 26.7 % — ABNORMAL LOW (ref 36.0–46.0)
MCHC: 34.5 g/dL (ref 30.0–36.0)
MCV: 88.4 fL (ref 78.0–100.0)
Platelets: 90 10*3/uL — ABNORMAL LOW (ref 150–400)
RDW: 14.7 % (ref 11.5–15.5)

## 2010-12-21 LAB — FERRITIN: Ferritin: 565 ng/mL — ABNORMAL HIGH (ref 10–291)

## 2010-12-21 LAB — OSMOLALITY, URINE: Osmolality, Ur: 261 mOsm/kg — ABNORMAL LOW (ref 390–1090)

## 2010-12-21 LAB — VITAMIN B12: Vitamin B-12: >2000 pg/mL — ABNORMAL HIGH (ref 211–911)

## 2010-12-21 LAB — IRON AND TIBC: TIBC: 148 ug/dL — ABNORMAL LOW (ref 250–470)

## 2010-12-22 LAB — CBC
Hemoglobin: 9.9 g/dL — ABNORMAL LOW (ref 12.0–15.0)
Platelets: 129 10*3/uL — ABNORMAL LOW (ref 150–400)
RBC: 3.21 MIL/uL — ABNORMAL LOW (ref 3.87–5.11)
WBC: 4.7 10*3/uL (ref 4.0–10.5)

## 2010-12-22 LAB — BASIC METABOLIC PANEL
CO2: 21 mEq/L (ref 19–32)
Calcium: 7.9 mg/dL — ABNORMAL LOW (ref 8.4–10.5)
Creatinine, Ser: 0.76 mg/dL (ref 0.4–1.2)
GFR calc non Af Amer: >60 mL/min (ref >60)
Glucose, Bld: 122 mg/dL — ABNORMAL HIGH (ref 70–99)
Sodium: 133 mEq/L — ABNORMAL LOW (ref 135–145)

## 2010-12-22 LAB — BRAIN NATRIURETIC PEPTIDE: Pro B Natriuretic peptide (BNP): <30 pg/mL (ref 0.0–100.0)

## 2010-12-23 LAB — STOOL CULTURE

## 2010-12-23 LAB — BASIC METABOLIC PANEL
CO2: 21 mEq/L (ref 19–32)
Calcium: 7.8 mg/dL — ABNORMAL LOW (ref 8.4–10.5)
Creatinine, Ser: 0.67 mg/dL (ref 0.4–1.2)
GFR calc Af Amer: >60 mL/min (ref >60)
GFR calc non Af Amer: >60 mL/min (ref >60)
Sodium: 135 mEq/L (ref 135–145)

## 2010-12-23 LAB — CBC
Hemoglobin: 9.2 g/dL — ABNORMAL LOW (ref 12.0–15.0)
MCH: 31.1 pg (ref 26.0–34.0)
MCHC: 34.7 g/dL (ref 30.0–36.0)
Platelets: 147 10*3/uL — ABNORMAL LOW (ref 150–400)
RDW: 15.1 % (ref 11.5–15.5)

## 2010-12-24 ENCOUNTER — Inpatient Hospital Stay (HOSPITAL_COMMUNITY): Payer: BC Managed Care – HMO

## 2010-12-24 LAB — BASIC METABOLIC PANEL
Calcium: 8.1 mg/dL — ABNORMAL LOW (ref 8.4–10.5)
GFR calc Af Amer: >60 mL/min (ref >60)
GFR calc non Af Amer: >60 mL/min (ref >60)
Glucose, Bld: 92 mg/dL (ref 70–99)
Potassium: 4 mEq/L (ref 3.5–5.1)
Sodium: 138 mEq/L (ref 135–145)

## 2010-12-24 LAB — LEGIONELLA PNEUMOPHILA ANTIBODIES: Legionella pneumo, IgG Type 1-6: <1:128 {titer}

## 2010-12-25 ENCOUNTER — Inpatient Hospital Stay (HOSPITAL_COMMUNITY): Payer: BC Managed Care – HMO

## 2010-12-25 LAB — BRAIN NATRIURETIC PEPTIDE: Pro B Natriuretic peptide (BNP): 32.1 pg/mL (ref 0.0–100.0)

## 2010-12-25 LAB — BASIC METABOLIC PANEL
CO2: 23 mEq/L (ref 19–32)
Chloride: 108 mEq/L (ref 96–112)
GFR calc Af Amer: >60 mL/min (ref >60)
Potassium: 3.9 mEq/L (ref 3.5–5.1)
Sodium: 138 mEq/L (ref 135–145)

## 2010-12-25 LAB — CBC
Hemoglobin: 9.5 g/dL — ABNORMAL LOW (ref 12.0–15.0)
MCH: 30.4 pg (ref 26.0–34.0)
Platelets: 195 10*3/uL (ref 150–400)
RBC: 3.13 MIL/uL — ABNORMAL LOW (ref 3.87–5.11)
WBC: 3.3 10*3/uL — ABNORMAL LOW (ref 4.0–10.5)

## 2010-12-25 LAB — T4, FREE: Free T4: 0.48 ng/dL — ABNORMAL LOW (ref 0.80–1.80)

## 2010-12-26 LAB — CULTURE, BLOOD (ROUTINE X 2)
Culture  Setup Time: 201201280110
Culture: NO GROWTH

## 2010-12-27 NOTE — Discharge Summary (Signed)
[name], [name]                  ACCOUNT NO.:  [id number]  MEDICAL RECORD NO.:  [id number]           PATIENT TYPE:  I  LOCATION:  1440                         FACILITY:  [facility]  PHYSICIAN:  Hillery Aldo, M.D.   DATE OF BIRTH:  [birth year]  DATE OF ADMISSION:  12/19/2010 DATE OF DISCHARGE:  12/25/2010                              DISCHARGE SUMMARY   PRIMARY CARE PHYSICIAN:  None.  ONCOLOGIST:  Samul Dada, M.D.  HEPATOLOGIST:  Dr. Agustin Cree at [facility].  DISCHARGE DIAGNOSES: 1. Left lower lobe community acquired pneumonia. 2. Gastroenteritis. 3. Ileus, resolved. 4. History of cirrhosis. 5. Chronic pancytopenia secondary to cirrhosis. 6. Obesity. 7. Newly diagnosed hypothyroidism  DISCHARGE MEDICATIONS: 1. Avelox 400 mg p.o. daily. 2. Prilosec 20 mg p.o. daily. 3. Propranolol 10 mg p.o. q.a.m., 20 mg p.o. q.h.s. 4. Synthroid 50 mcg p.o. daily  CONSULTATIONS:  None.  BRIEF ADMISSION HISTORY OF PRESENT ILLNESS:  The patient is a 59 year old female who presented to the hospital with a chief complaint of nausea, vomiting, fever and cough.  Upon initial evaluation in the emergency department, she was found to have a left lower lobe pneumonia and subsequently referred for inpatient management.  For the full details, please see the dictated report done by Dr. Adela Glimpse.  PROCEDURES AND DIAGNOSTIC STUDIES: 1. Chest x-ray on December 19, 2010, showed left lower lobe pneumonia     and parapneumonic effusion. 2. V/Q scan on December 20, 2010, showed a matched perfusion     ventilation defect in the left mid to lower lung corresponding to     the opacity seen on chest radiograph.  This was felt to be     intermediate probability for pulmonary embolism. 3. KUB on December 20, 2010, showed small loops of bowel with upper     limits of normal in size with gas seen in the colon. 4. Chest x-ray on December 20, 2010, showed stable left lower lobe     pneumonia and left effusion. 5. KUB  on December 21, 2010, showed an ileus with increasing distention     of small bowel. 6. KUB on December 22, 2010, showed persistent but improved colonic     gaseous distention, possibly representing ileus. 7. KUB on December 23, 2010, showed further improvement in mild colonic     ileus. 8. KUB on December 24, 2010, showed continued improvement in ileus     pattern. 9. Chest x-ray on December 25, 2010, showed increased density in the     left hemithorax suggesting worsening pleural effusion and/or     airspace disease with low lung volumes.  DISCHARGE LABORATORY VALUES:  BNP was 32.1.  Sodium 138, potassium 3.9, chloride 108, bicarb 23, BUN 3, creatinine 0.67, glucose 85, calcium 8.2.  White blood cell count was 3.3, hemoglobin 9.5, hematocrit 28.5, platelets 195.  Legionella pneumophila antigen IgG was negative.  Stool cultures showed reduced normal flora.  Urine cultures were negative. TSH was 16.305 with free T4 studies pending at the time of this dictation.  HOSPITAL COURSE BY PROBLEM: 1. Left lower lobe community-acquired pneumonia:  The patient  was     initially put on Zosyn to cover both pneumonia and possible     colitis.  She was switched over to p.o. Avelox on December 24, 2010.     Continued to have evidence of clinical improvement.  At this point,     she will need close followup to ensure radiographic clearing and     will complete an additional 5 days of Avelox for total antibiotic     course of 10 days. 2. Ileus/gastroenteritis:  The patient likely had a viral     gastroenteritis.  Once her symptoms resolved, the Zosyn was     discontinued and serial KUB radiographs were done which documented     improvement over time. 3. History of cirrhosis/chronic pancytopenia:  The patient will follow     up with her hepatologist at [facility]. 4. Hypothyroidism:  The patient did have a screening TSH.  Free T4 studies     were done which confirmed hypothyroidism.  The patient was placed on  synthroid    replacement therapy and has been advised to follow up with one of her    treating physicians for a follow up check of her thyroid function in 6 weeks.  DISPOSITION:  The patient is medically stable and will be discharged home.  Time spent coordinating care for discharge and discharge instructions including face-to-face time equals 35 minutes.     Hillery Aldo, M.D.     CR/MEDQ  D:  12/25/2010  T:  12/25/2010  Job:  [id number]  cc:   Samul Dada, M.D. Fax: [phone]  Dr. Agustin Cree at [facility]  Electronically Signed by Hillery Aldo M.D. on 12/27/2010 01:37:43 PM

## 2011-04-07 NOTE — Op Note (Signed)
[name], [name]                  ACCOUNT NO.:  [id number]   MEDICAL RECORD NO.:  [id number]          PATIENT TYPE:  AMB   LOCATION:  ENDO                         FACILITY:  [facility]   PHYSICIAN:  James L. Malon Kindle., M.D.DATE OF BIRTH:  1951-09-23   DATE OF PROCEDURE:  07/02/2008  DATE OF DISCHARGE:                               OPERATIVE REPORT   PROCEDURE:  Esophagogastroduodenoscopy and biopsy.   MEDICATIONS:  Cetacaine spray, fentanyl 50 mcg, Versed 6 mg IV.   INDICATION:  A very nice 60 year old woman with cryptogenic cirrhosis,  portal hypertension and a history of variceal bleeding.  She had a right  hemicolectomy for stage III-B colon cancer and underwent chemotherapy,  this was about 6 years ago.  She is been followed by Dr. Woodfin Ganja at  [facility].  She is being worked up for a possible liver  transplantation in the future.  She has had this variceal bleed since  undergoing at least 3-4 banding procedures on her esophagus.  This is  done to evaluate the status of her esophageal and gastric varices.   DESCRIPTION OF PROCEDURE:  The procedure had been explained to the  patient and consent obtained.  The plan was not to do banding at this  time, but simply to evaluate the status of the varices endoscopically.  The scope was passed blindly with agglutination and the esophagus was  entered.  We carefully washed and irrigated the esophagus.  There were  varices in the mid to distal esophagus, however, right above the GE  junction, very few varices were seen.  This area was scarred from  previous banding and some of the variceal bulges that were seen did  nearly flatten out completely with air insufflation.  The stomach was  entered, and upon arriving at the antrum there was marked erosive  gastritis.  The mucosa was friable.  There may have been some portal  gastropathy throughout the stomach.  The duodenum was entered.  The  second duodenum was seen.  There was a  prominent ampulla.  There was no  duodenitis or duodenal ulceration.  The scope was withdrawn back into  the antrum.  Initial findings were confirmed.  A biopsy was obtained for  rapid urease test for Helicobacter.  The mucosa was quite friable.  We  then placed the scope in the retroflexed view.  The patient was seen to  have quite prominent gastric varices.  These were photographed.  The  scope was withdrawn.  Initial findings in the esophagus were confirmed.  She tolerated the procedure well and was resting comfortably at the  termination of the procedure.   ASSESSMENT:  1. Esophageal varices extending up to the mid esophagus with marked      scarring from previous banding procedures.  No signs of impending      or recent bleeding.  2. Prominent gastric varices unchanged from previous endoscopy.  3. Erosive gastropathy in the antrum and probable portal gastropathy      as well.  Test for Helicobacter obtained and pending.   PLAN:  Will  check the test for Helicobacter.  Will start her empirically  on Prilosec OTC 20 mg daily.  See her back in the office in 3 months.  She will continue to follow up with Dr. [name] at [facility].           ______________________________  [name]. Malon Kindle., M.D.     Waldron Session  D:  07/02/2008  T:  07/02/2008  Job:  [id number]   cc:   Woodfin Ganja, MD  Division of Digestive Diseases  [facility]  [place], [place]   Samul Dada, M.D.  Fax: [phone]

## 2011-04-07 NOTE — Op Note (Signed)
NAMEEVELEN, VAZGUEZ                  ACCOUNT NO.:  [id number]   MEDICAL RECORD NO.:  [id number]          PATIENT TYPE:  AMB   LOCATION:  DAY                          FACILITY:  [facility]   PHYSICIAN:  Ollen Gross. Vernell Morgans, M.D. DATE OF BIRTH:  16-Aug-1951   DATE OF PROCEDURE:  11/14/2007  DATE OF DISCHARGE:                               OPERATIVE REPORT   PREOPERATIVE DIAGNOSIS:  History of colon cancer and left subclavian  vein Port-A-Cath.   POSTOPERATIVE DIAGNOSIS:  History of colon cancer and left subclavian  vein Port-A-Cath.   PROCEDURE:  Removal of Port-A-Cath.   SURGEON:  Ollen Gross. Vernell Morgans, M.D.   ANESTHESIA:  Local with IV sedation.   PROCEDURE:  After informed consent was obtained, the patient was brought  to the operating room and placed in the supine position on the operating  table.  After adequate IV sedation had been given, the patient's left  chest was prepped with Betadine and draped in the usual sterile manner.  The area of her old incision for her port was infiltrated with 1%  lidocaine as well as with 0.25% Marcaine.  A small incision was made  through her old incision with a 15 blade knife.  This incision was  carried down through the skin and subcutaneous tissue sharply with a 15  blade knife.  Once into the subcutaneous tissue, the old port was  identified by a combination of blunt hemostat dissection and sharp  dissection with Metzenbaum scissors.  The capsule around the port was  opened sharply with Metzenbaum scissors.  Two anchoring Prolene stitches  were divided and removed.  Once this was accomplished, we were able to  push the port well out of the pocket, and then the entire port was able  to be removed without difficulty.  Pressure was held on the tract for  several minutes until the area was completely hemostatic.  The deep  layer of the wound was then closed with interrupted 3-0 Vicryl stitches,  and the skin was closed with interrupted 4-0 Monocryl  subcuticular  stitches, and Dermabond dressing was applied.   The patient tolerated the procedure well.  At the end of the case, all  needle, sponge, and instrument counts were correct.  The patient was  then awakened and taken to recovery in stable condition.      Ollen Gross. Vernell Morgans, M.D.  Electronically Signed     PST/MEDQ  D:  11/14/2007  T:  11/14/2007  Job:  [id number]

## 2011-04-07 NOTE — Op Note (Signed)
[name], [name]                  ACCOUNT NO.:  [id number]   MEDICAL RECORD NO.:  [id number]          PATIENT TYPE:  AMB   LOCATION:  ENDO                         FACILITY:  [facility]   PHYSICIAN:  James L. Malon Kindle., M.D.DATE OF BIRTH:  1951-06-08   DATE OF PROCEDURE:  06/26/2009  DATE OF DISCHARGE:                               OPERATIVE REPORT   PROCEDURE:  Esophagogastroduodenoscopy.   INDICATIONS:  The patient has a history of cirrhosis, possibly due to  chemotherapy that she received for colon cancer.  Her last endoscopy was  performed about a year ago, showing small __________ but complete  flattening of the varices with air insufflation.  This is done as a  repeat procedure.  We discussed this in detail with the patient and told  her that if felt indicated we would perform esophageal banding at this  time.   DESCRIPTION OF PROCEDURE:  Procedure had been explained to the patient  and consent obtained.   In the left lateral decubitus position the Pentax upper scope was  inserted and advanced.  The esophagus was entered.  We passed down to  the distal esophagus.  The patient was seen to have varices, but  insufflation with air completely flattened varices in the distal  esophagus.  There was marked scarring from previous bandings.  We then  slowly withdrew the scope up and there were more and more prominent  varices that were really quite prominent and did not completely  flattened in the proximal esophagus.  We passed down into the stomach  and the retroflexed view did reveal large gastric varices that were  unchanged from previous endoscopy.  There was mild portal gastropathy in  the antrum.  No ulcerations.  The duodenum, including bulb and second  portion, was seen well, was unremarkable.  Scope was withdrawn back into  the stomach and the esophagus was again carefully examined upon  withdrawal.  Everything was decompressed and there were clearly some  varices in the  distal esophagus, they did flatten completely with air  insufflation; and as we came more proximal the varices were more  prominent did not flatten as well.   At this point I made the decision, since the primary area of varices was  in the proximal esophagus, to defer banding at this time.  Scope was  withdrawn.  The patient tolerated procedure well.   ASSESSMENT:  1. Esophageal varices with the varices most prominent in the proximal      esophagus and flattened with insufflation in the distal esophagus.      At this point I would like to discuss this further; even have her      evaluated for another opinion at [facility] where she is followed      by the hepatology team, prior to initiating banding.  2. Prominent gastric varices, probably unchanged from previous      endoscopy.  3. Portal gastropathy.   PLAN:  Will discuss her case with Dr. [name] at [facility] prior to  making any final decision about banding.  May even want  to have her  examined there regarding the possibility of banding the proximal  esophageal varices.           ______________________________  [name] Malon Kindle., M.D.     Waldron Session  D:  06/26/2009  T:  06/26/2009  Job:  [id number]   cc:   Dr. Everardo Pacific  Division of Digestive Diseases  [facility]  [place], [place]   Samul Dada, M.D.  Fax: [phone]

## 2011-04-10 NOTE — Op Note (Signed)
[name], [name]                         ACCOUNT NO.:  [id number]   MEDICAL RECORD NO.:  [id number]                   PATIENT TYPE:  AMB   LOCATION:  ENDO                                 FACILITY:  [facility]   PHYSICIAN:  James L. Malon Kindle., M.D.          DATE OF BIRTH:  Jun 26, 1951   DATE OF PROCEDURE:  01/15/2004  DATE OF DISCHARGE:                                 OPERATIVE REPORT   PROCEDURE:  Colonoscopy.   MEDICATIONS:  1. Fentanyl 100 mcg.  2. Versed 12 mg IV.   INDICATION:  A 60 year old who is 1 year status post right hemicolectomy for  colon cancer.  Histologic classification T3, N1, M0.  This is done as a 1  year follow-up.   SCOPE:  Olympus pediatric adjustable colonoscope.   DESCRIPTION OF PROCEDURE:  The procedure had been explained to the patient  and consent obtained.  With the patient in the left lateral decubitus  position, the Olympus scope was inserted and advanced.  The prep was  excellent.  We were able to reach the transverse colon using some abdominal  pressure and with the patient in the supine position, down into the  remaining right colon which is only a small amount.  The anastomosis was  clearly identified.  The sutures were seen.  There was no evidence of  recurrent cancer at the anastomosis.  The scope was withdrawn, and the  transverse colon, splenic flexure, descending and sigmoid colon were seen  well upon removal, and no polyps or other lesions were seen.  There was no  significant diverticular disease.  The scope was withdrawn to the rectum.  The rectum was free of polyps.  The scope withdrawn.  The patient tolerated  the procedure well.   ASSESSMENT:  Normal postoperative colon.  V10.05.   PLAN:  We will recommend yearly Hemoccults and recommend repeating  colonoscopy in 3 years.                                               James L. Malon Kindle., M.D.    Waldron Session  D:  01/15/2004  T:  01/15/2004  Job:  [id number]   cc:   S. Kyra Manges, M.D.  [phone] [address]  [place]  [place] [address]  Fax: [phone]   Samul Dada, M.D.  [address] [facility]  [place]  [place] [address]  Fax: [phone]   Ollen Gross. Vernell Morgans, M.D.  [address]  [place]  [place] [address]  Fax: [phone]

## 2011-04-10 NOTE — Consult Note (Signed)
NAMESELITA, [name]                  ACCOUNT NO.:  [id number]   MEDICAL RECORD NO.:  [id number]          PATIENT TYPE:  AMB   LOCATION:  ENDO                         FACILITY:  [facility]   PHYSICIAN:  James L. Malon Kindle., M.D.DATE OF BIRTH:  1951-07-16   DATE OF CONSULTATION:  10/29/2006  DATE OF DISCHARGE:                                 CONSULTATION   PROCEDURE:  Esophagogastroduodenoscopy with banding of esophageal  varices.   MEDICATIONS:  1. Phenergan 12.5 mg.  2. Versed was 7 mg.  3. Fentanyl 70 mcg IV.   INDICATIONS:  A 60 year old with cirrhosis and a history of varices with  previous bleeds.  She has been doing quite well at this point,  and has  undergone elective variceal ligation.   DESCRIPTION OF PROCEDURE:  Procedure explained to the patient.  Consent  obtained.  Then the Pentax endoscope was used.  The scope was inserted,  and the patient was seen to have esophageal varices with marked areas of  sclerosis from previous banding, particularly in the distal esophagus.  There were still some dilated varices in the proximal esophagus and a  few in he the distal esophagus.  Scope was passed in the stomach and  past the pyloric channel.  Duodenal bulb and second duodenum were  normal.  The antrum and body of the stomach were normal and the  retroflex viewed.  The patient was seen to have a more prominent  clumping of the mucosa around the GE junction, consistent with gastric  varices.  We came up into the esophagus and photographed the esophageal  varices.  There was still some areas of dilation in the distal  esophagus.  The scope was then withdrawn and a separate Pentax scope  with the [facility] scientific multi-bander apparatus, already loaded on the  scope, was inserted.  Went down to the distal esophagus and applied two  bands distally and one somewhat more proximally.  There was no bleeding.  At this point, we were up to about 25 cm, and I was hesitant to apply  bands  this high, and I withdrew the scope.  The patient tolerated the  procedure well.  There are no immediate complications.   ASSESSMENT:  Esophageal varices successfully banded   PLAN:  Will follow the patient clinically.   RECOMMENDATIONS:  I will see him back in the office in January.  Will  likely need to this procedure several more times.  Sign 10/00 words in  the top of Dr. Celedonio Savage appearance of Dr. Maurice March graft and into the left soft  neck           ______________________________  [name]. Malon Kindle., M.D.    Waldron Session  D:  10/29/2006  T:  10/29/2006  Job:  [id number]   cc:   Samul Dada, M.D.  Gretta Arab Valentina Lucks, M.D.

## 2011-04-10 NOTE — Op Note (Signed)
[name], [name]                  ACCOUNT NO.:  [id number]   MEDICAL RECORD NO.:  [id number]          PATIENT TYPE:  AMB   LOCATION:  ENDO                         FACILITY:  [facility]   PHYSICIAN:  James L. Malon Kindle., M.D.DATE OF BIRTH:  Dec 11, 1950   DATE OF PROCEDURE:  09/20/2006  DATE OF DISCHARGE:                                 OPERATIVE REPORT   SURGEON:  Fayrene Fearing L. Randa Evens, M.D.   PROCEDURE:  Esophagogastroduodenoscopy with esophageal banding.   MEDICATIONS:  Phenergan 12.5 mg IV, fentanyl 60 mcg and Versed 7.5 mg IV.   INDICATIONS:  Patient with cirrhosis and portal hypertension of unclear  cause, possibly related to fatty liver, with contributions with  chemotherapy.  No history of alcohol.  She has had variceal bleed and  underwent banding twice.  The last time she was banded, one of the bands  came off, precipitating bleeding and requiring admission with octreotide and  this controlled her bleeding.  This was done as an elective outpatient  procedure.   DESCRIPTION OF PROCEDURE:  The procedure had been explained, including the  possibilities of bleeding, infection, etc., ,multiple times to the patient  and her husband and consent obtained.  In the left lateral decubitus  position, the Olympus upper scope was inserted and advanced into the  esophagus.  The patient had very large varices extending up the whole way of  the esophagus.  There were several areas where there was obvious scarring  from previous banding, with resolution of the varices in that section, but  other areas still had large, prominent varices.  We went down into the  stomach.  A complete endoscopy was performed.  The duodenal bulb, antrum and  body were normal.  On the retroflex view, the patient had some large folds  up around the cardia that were suspicious for varices.  We then withdrew the  scope and the initial findings were confirmed.  There were no signs of  active bleeding.  The scope was  withdrawn.  Another scope had been  previously preloaded with the [employer] apparatus.  We  then inserted this finally into the distal esophagus, and at this point,  placed several bands, which clearly were seen to be on the varices down in  the distal esophagus.  At least 5 bands were placed.  A sixth band  apparently came off, and I placed a seventh and it appeared in the mid-to-  proximal esophagus, and this appeared to stay.  There was no active  bleeding.  The scope was withdrawn.  The patient was resting comfortably at  the termination of the procedure.   ASSESSMENT:  Esophageal varices, banded with multiple bands.   PLAN:  I will give the patient a Lortab elixir for discomfort.  I will place  her on Prilosec b.i.d. for a week.  I will plan on repeating the procedure  in about 6 weeks.           ______________________________  [name] Malon Kindle., M.D.     Waldron Session  D:  09/20/2006  T:  09/20/2006  Job:  [id number]   cc:   S. Kyra Manges, M.D.  Samul Dada, M.D.

## 2011-04-10 NOTE — Op Note (Signed)
[name], BAISE                  ACCOUNT NO.:  [id number]   MEDICAL RECORD NO.:  [id number]          PATIENT TYPE:  INP   LOCATION:  2029                         FACILITY:  [facility]   PHYSICIAN:  John C. Madilyn Fireman, M.D.    DATE OF BIRTH:  Jan 18, 1951   DATE OF PROCEDURE:  07/30/2006  DATE OF DISCHARGE:                                 OPERATIVE REPORT   PROCEDURE:  Esophagogastroduodenoscopy with esophageal variceal banding.   INDICATIONS FOR PROCEDURE:  Known esophageal varices with recent bleeding.   PROCEDURE:  The patient was placed in the left lateral decubitus position  and placed on the pulse monitor with continuous low-flow oxygen delivered by  nasal cannula.  She was sedated with 125 mcg IV fentanyl and 10 mg IV  Versed.  The Olympus video endoscope was advanced under direct vision into  the oropharynx and esophagus.  The esophagus was straight and of normal  caliber.  At the squamocolumnar line at 38 cm there were large esophageal  varices as seen for there were not bleeding.  I went down with the banding  apparatus on and since we had gotten a good inspection of the stomach and  duodenum  yesterday, I did not do a complete examination but there was no  blood seen in the stomach.  The bander which used to place seven bands on  the varices from 40 cm to approximately 28 cm.  There was minimal self-  limiting bleeding from application of some the bands.  The scope was then  withdrawn and the patient returned to the recovery room in stable condition.  She tolerated the procedure well.  There were no immediate complications.   IMPRESSION:  Esophageal varices banded x7.   PLAN:  Will probably repeat serial banding and repeat banding study in  approximately 2 weeks.           ______________________________  [name] Madilyn Fireman, M.D.     JCH/MEDQ  D:  07/30/2006  T:  07/30/2006  Job:  [id number]   cc:   Darlin Priestly, MD  Gretta Arab Valentina Lucks, M.D.  James L. Malon Kindle., M.D.  Samul Dada, M.D.

## 2011-04-10 NOTE — Discharge Summary (Signed)
[name], [name]                  ACCOUNT NO.:  [id number]   MEDICAL RECORD NO.:  [id number]          PATIENT TYPE:  INP   LOCATION:  2038                         FACILITY:  [facility]   PHYSICIAN:  Fayrene Fearing L. Malon Kindle., M.D.DATE OF BIRTH:  1951-10-26   DATE OF ADMISSION:  08/24/2006  DATE OF DISCHARGE:  08/27/2006                                 DISCHARGE SUMMARY   ADMISSION DIAGNOSES:  1. Variceal bleed following endoscopic banding procedure.  2. Cirrhosis of the liver of undetermined etiology with portal      hypertension.  3. History of colon cancer status post right hemicolectomy.   FINAL DIAGNOSES:  1. Variceal bleed, resolved with conservative therapy.  2. Possible aspiration pneumonia suggested by chest x-ray with no other      symptoms.  3. Leukopenia of unclear cause.  4. Other chronic diagnoses as noted above.   PROCEDURES:  Patient had an endoscopy with a elective banding of esophageal  varices on August 24, 2006.  She had a Doppler ultrasound of her liver and  portal system.   PERTINENT HISTORY:  A very nice, 60 year old woman who has had a right  hemicolectomy in the past and chemotherapy in 2004 for colon cancer.  She  has developed portal hypertension.  The cause of this portal hypertension is  not clear.  It is felt to be due to possible fatty liver with possible  superimposed issues from her chemotherapy.  Liver biopsy has been offered in  the past, but has been refused up until this point in time.  She was  admitted approximately 1 month ago with an acute hemorrhage due to  esophageal varices.  The bleeding was banded.  She came in for an elective  banding procedure on August 24, 2006, on the day of her admission.  Several  bands were placed and, either due to passage of the scope or with a band  coming off, there was acute variceal bleeding noted with the procedure.  We  were unable to identify the bleeding site.  The patient was vomiting up  blood and had an  episode of decreased O2 saturation and shortness of breath  that resolved.  On physical exam the patient had just had then endoscopy,  she was sedated, but alert and responsive.  There was blood in her mouth and  on the bed, which she had vomited up coffee-ground material.  At this point  in time, her O2 sats were 100% on a rebreathing mask.  Vital signs were  stable.  For more details please see admission history and physical   HOSPITAL COURSE:  The patient was admitted to the ICU, initially, with a  diagnosis of acute esophageal variceal bleeding with possible aspiration.  She was started on IV infusion with octreotide.  Blood was typed and  crossmatched, but never had to be transfused.  Her initial hemoglobin, on  admission, was 10.8 and it did drift down to about 9.7.  She passed some old  blood, her stools cleared up, and she never had any further bleeding.  Chest  x-ray  was obtained shortly after admission and this revealed bilateral  airspace disease with the left worse than the right, possible atelectasis,  possible aspiration.  The patient never had a fever or elevated white count.  She was given Rocephin immediately following the procedure and then received  another dose of this in the hospital.  A Doppler ultrasound was obtained to  look for flow in the portal vein incase a TIPS procedure was required and  this revealed a normal evaluation of the liver with no masses, with patent  portal vein flow, and no ascites.  It was felt that she would be a  reasonable candidate for TIPS procedure if needed.  Her octreotide was  continued and she was seen in consultation by the interventional radiology  service to discuss TIPS procedure with she and her husband in case it was  needed.  Fortunately, she was stable, had no further bleeding, and it was  decided that a TIPS procedure was not required at this point.  On August 26, 2006, 2 days after she was admitted, she had no further signs  of  bleeding, she was tolerating clear liquids.  The only abnormality was that  her white count had dropped to 2.  We thought this might well be due to the  antibiotics and the octreotide.  The octreotide was stopped and she was  watched overnight.  Her white count remained at 1.9.  I did not feel that  was a significant change.  She was switched to Avalox orally, had no fever,  chills, coughing, and was continued to do well with no signs of bleeding,  was tolerating a mechanically soft diet.   DISPOSITION:  The patient is discharged home and she will be discharged on  Prilosec, over the counter, twice daily for 1 week, Avalox 400 mg daily for  3 days.  Arrangements have been made for her to come with Dr. Randa Evens'  office on Monday, August 30, 2006, for a blood count.  It is likely another  follow up and banding procedure will be arranged in 2 to 3 weeks.  She is  instructed to call for any signs of further bleeding.  We will also obtain a  retic count and a differential with plans to refer her back to Dr. Arline Asp  should her white count remain low.           ______________________________  [name]. Malon Kindle., M.D.     Waldron Session  D:  08/27/2006  T:  08/28/2006  Job:  [id number]   cc:   Gretta Arab. Valentina Lucks, M.D.  Samul Dada, M.D.  Darlin Priestly, MD

## 2011-04-10 NOTE — Discharge Summary (Signed)
   [name], TINDOL                         ACCOUNT NO.:  [id number]   MEDICAL RECORD NO.:  [id number]                   PATIENT TYPE:  INP   LOCATION:  0370                                 FACILITY:  [facility]   PHYSICIAN:  Ollen Gross. Vernell Morgans, M.D.              DATE OF BIRTH:  [birth year]   DATE OF ADMISSION:  01/08/2003  DATE OF DISCHARGE:  01/13/2003                                 DISCHARGE SUMMARY   HISTORY OF PRESENT ILLNESS:  The patient is a 60 year old white female with  known right colon cancer.   HOSPITAL COURSE:  She was brought to the operating room on 01/08/03, and  underwent a right hemicolectomy in which she tolerated well.  Her  postoperative course was fairly unremarkable.  Her NG tube was discontinued  on postoperative day #3.  On postoperative day #4, she was started on  liquids which she tolerated well, and by postoperative day #5, she was  ambulating, tolerating her diet, and was ready for discharge home.   DISCHARGE MEDICATIONS:  1. She is to resume her home medications.  2. She is given Vicodin for pain.   ACTIVITY:  No heavy lifting.   DIET:  As tolerated.   FOLLOWUP:  In one week for staple removal to the clinic.   FINAL DIAGNOSIS:  Right colon cancer.   DISPOSITION:  Discharged home.                                               Ollen Gross. Vernell Morgans, M.D.    PST/MEDQ  D:  01/31/2003  T:  02/01/2003  Job:  [id number]

## 2011-04-10 NOTE — Consult Note (Signed)
NAMEIVONE, LICHT                  ACCOUNT NO.:  [id number]   MEDICAL RECORD NO.:  [id number]          PATIENT TYPE:  INP   LOCATION:                               FACILITY:  [facility]   PHYSICIAN:  John C. Madilyn Fireman, M.D.    DATE OF BIRTH:  1951/05/19   DATE OF CONSULTATION:  07/29/2006  DATE OF DISCHARGE:                                   CONSULTATION   CHIEF COMPLAINT:  Patient's chief complaint is hematemesis.   HISTORY OF PRESENT ILLNESS:  This is a 60 year old Caucasian female with a  history of colon cancer and right hemicolectomy in 2004 who presented to the  ER this afternoon.  Ms. Ealey states that she noticed black tarry stools  starting on August 30th.  On September 3rd, she became nauseous and  developed dark diarrhea.  On Tuesday night, September 4th, she became weak,  dizzy, and vomited a minor amount of pink blood.  On September 5th, she was  nauseous and vomiting dark red blood.  She called her oncologist, Dr.  Arline Asp and he referred her to the local urgent care center to the ER this  afternoon.  Currently, Ms. Pippins reports that she is lightheaded on standing  and feels like her heart is beating rapidly.  She also reports her color has  become pale.  Today, she has experienced no bleeding or vomiting.  A CT of  her abdomen on May 29, 2006, showed a mass in her ascending duodenum and  portal hypertension.   PMH is significant for the discovery of colon cancer in march of 2004 and a  right hemicolectomy.  The patient reports that following chemo damaged her  liver and she maintains elevated liver enzymes.  She reports that her  gastroenterologist, Dr. Randa Evens, has mentioned to her that he feels like she  might have some type of blockage in her hepatic system.  She still has a  Port-A-Cath in place on her left anterior chest.  No other surgeries or  serious adult illnesses were reported by the patient.  Current home  medications are none.  She has been taking an herbal  supplement called Gogi  juice for the past 3 months in order to build up her energy level.  She  denies taking any NSAIDs or Tylenol.   DRUG ALLERGIES INCLUDE IODINE AND CONTRAST DYES.   Review of systems was negative including no weight loss, no recent  illnesses, and no other abdominal symptoms such as dysphagia, indigestion,  or abdominal pain.  Family history is negative for any GI cancers.   SOCIAL HISTORY:  She lives with her husband.  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  She denies any tobacco, alcohol, or drug use.   LABS:  A B-met and a CBC have been ordered.   On physical exam, her vitals are temperature 98.5, heart rate 90.  Respirations are 20.  Blood pressure is 114/71.  Her O2 sat is 100% on room  air.  In general, she is alert and oriented, in no apparent distress and she  is  standing and walking comfortably.  Her skin does appear pale.  Her eyes show  pale conjunctivae and clear sclerae.  Her tongue is pale.  Her neck is supple with no adenopathy, no thyromegaly.  Her cardiovascular system has a regular rate and rhythm with no murmurs,  rubs, or gallops appreciated.  Her lungs are clear to auscultation bilaterally.  Her abdomen is soft, nontender, nondistended.  Has minimal bowel sounds.  No  bruits or masses.   ASSESSMENT AND PLAN:  This is a 60 year old Caucasian female with a history  of colon cancer and elevated liver enzymes secondary to chemotherapy who is  suffering with hematemesis.  Dr. Dorena Cookey will perform endoscopy this  afternoon on Mrs. Gawlik to search for the cause of bleeding.  Differentials  include a possible varicocele bleed or gastric polyp, gastric ulcer.      Stephani Police, PA    ______________________________  [name] Madilyn Fireman, M.D.    MLY/MEDQ  D:  07/29/2006  T:  07/29/2006  Job:  [id number]   cc:   Samul Dada, M.D.  John C. Madilyn Fireman, M.D.  James L. Malon Kindle., M.D.

## 2011-04-10 NOTE — Op Note (Signed)
[name], [name]                  ACCOUNT NO.:  [id number]   MEDICAL RECORD NO.:  [id number]          PATIENT TYPE:  INP   LOCATION:  2312                         FACILITY:  [facility]   PHYSICIAN:  Fayrene Fearing L. Malon Kindle., M.D.DATE OF BIRTH:  13-Mar-1951   DATE OF PROCEDURE:  08/24/2006  DATE OF DISCHARGE:                                 OPERATIVE REPORT   SURGEON:  Fayrene Fearing L. Randa Evens, M.D.   PROCEDURE:  Esophagogastroduodenoscopy with banding of esophageal varices.   MEDICATIONS:  Fentanyl 100 mcg, Versed 9 mg IV, and Phenergan 12.5 mg IV.   INDICATIONS:  The patient with a stenosis probably due to chemotherapy, with  previous GI bleed due to esophageal varices.  She is coming for repeat  banding.   DESCRIPTION OF PROCEDURE:  The procedure had been explained to the patient.  Her questions were asked and she had no further questions.  In the left  lateral decubitus position, the scope was inserted and advanced.  We  advanced down into the esophagus.  The patient did have large varices that  were not actively bleeding.  They were swollen.  We could not really see any  gaps or scars or ulcers from previous banding.  We went down in the stomach.  She had had some mild gastritis in the stomach.  Upon the retroflex view, it  appeared that she probably had gastric varices.  The scope was then  withdrawn and the [facility] multiple banding apparatus was placed on  the scope by the endoscopy staff.  The scope was reinserted to the distal  esophagus and we attempted to fire the bands.  Unfortunately, the apparatus  did not work and the scope had to be removed and another apparatus had to be  placed.  We went back again, and this one did work, and several bands were  placed.  Apparently, one of the bands did come off, or the scope scraped a  varix, or for unclear reasons, the patient began to bleed.  At this point,  all the bands were gone and we withdrew the scope and placed another  apparatus and went back at this point.  There was so much blood, we were  unable to clearly see the bleeding site.  We attempted to place bands around  the area of bleeding, but it was not clear that the bleeding was controlled.  The scope was withdrawn.  The patient did have a brief period of apnea.  It  was not clear if this was medication related, or possibly aspiration.  It  resolved quickly with a rebreathing mask.   ASSESSMENT:  Active variceal bleed in woman with large esophageal varices  and probable gastric varices.   PLAN:  Will admit her to the ICU, place her on IV octreotide.  We will  discuss with her husband and radiology about a possible TIPS procedure if  the bleeding continues.           ______________________________  [name] Malon Kindle., M.D.     Waldron Session  D:  08/24/2006  T:  08/24/2006  Job:  [id number]   cc:   Gretta Arab. Valentina Lucks, M.D.  Samul Dada, M.D.  Darlin Priestly, MD

## 2011-04-10 NOTE — Discharge Summary (Signed)
[name], [name]                  ACCOUNT NO.:  [id number]   MEDICAL RECORD NO.:  [id number]          PATIENT TYPE:  INP   LOCATION:  2029                         FACILITY:  [facility]   PHYSICIAN:  John C. Madilyn Fireman, M.D.    DATE OF BIRTH:  12-Apr-1951   DATE OF ADMISSION:  07/29/2006  DATE OF DISCHARGE:  08/01/2006                                 DISCHARGE SUMMARY   HISTORY OF PRESENT ILLNESS:  The patient is a 60 year old white female with  a history of colon cancer status post right hemicolectomy 2004 with  subsequent chemotherapy and development of cirrhosis of liver felt to be  secondary to this with known mesenteric varices on previous CT scan with no  history of GI bleeding, who presented on the day of admission with onset of  nausea, vomiting and hematemesis with some dark blood in her stool.  She  presented to the emergency room, had a hemoglobin 6.1 with mild orthostatic  vital sign changes.  For details, please see admission history and physical.   HOSPITAL COURSE:  The patient underwent EGD on the day of admission.  She  had very large esophageal varices which were not bleeding.  At the time of  the procedure, her INR was not back.  Her hemoglobin was 6.1.  She had not  been typed and screened, therefore held off on doing any therapy.  She was  admitted, given a total of 4 units packed red blood cells, three initially  and one two days later.  Her INR came back at 1.4.  The following day she  underwent EGD with seven bands placed on her varices.  She did have one  episode of vomiting on the floor, and noticed that she had vomited up one  bands, but subsequently did well throughout her admission with stability of  hemoglobin between 10 and 11.  She was advanced to clear full liquid diet  and was felt ready for discharge on September 9.  Her last stool had a  lighter brown appearance than the dark brown of the last two.  She was on no  medicines on admission and I elected not to  started her on a beta blocker,  but instead will plan on repeat banding in 2 weeks.   DISCHARGE DIAGNOSES:  1. Esophageal varices with hemorrhage.  2. Nonalcoholic steatohepatitis with cirrhosis.  3. History of colon cancer.   CONDITION ON DISCHARGE:  Improved.   FOLLOWUP:  1. Follow up with Dr. Randa Evens 2-4 weeks.  2. Followup EGD with banding in 2-4 weeks with Dr. Madilyn Fireman or Dr. Randa Evens.           ______________________________  [name] Madilyn Fireman, M.D.     JCH/MEDQ  D:  08/01/2006  T:  08/02/2006  Job:  [id number]   cc:   Fayrene Fearing L. Malon Kindle., M.D.  Gretta Arab Valentina Lucks, M.D.  Samul Dada, M.D.  Darlin Priestly, MD

## 2011-04-10 NOTE — Op Note (Signed)
NAMECHRYSTINE, [name]                         ACCOUNT NO.:  [id number]   MEDICAL RECORD NO.:  [id number]                   PATIENT TYPE:  AMB   LOCATION:  DSC                                  FACILITY:  [facility]   PHYSICIAN:  Ollen Gross. Vernell Morgans, M.D.              DATE OF BIRTH:  July 18, 1951   DATE OF PROCEDURE:  02/02/2003  DATE OF DISCHARGE:                                 OPERATIVE REPORT   PREOPERATIVE DIAGNOSIS:  Colon cancer.   POSTOPERATIVE DIAGNOSIS:  Colon cancer.   PROCEDURE:  Placement of a left subclavian port-A-Cath.   SURGEON:  Ollen Gross. Carolynne Edouard, M.D.   ANESTHESIA:  Local and IV sedation.   PROCEDURE:  After informed consent was obtained, the patient was brought to  the operating room and placed in the supine position on the operating table.  The patient's chest and neck area was prepped with Betadine and draped in  the usual sterile manner.  The patient was then sedated with some IV  medicine.  The area just lateral to the bend of the left clavicle was then  infiltrated with 1% lidocaine.  A small incision was made with a 15 blade  knife, and a large bore needle from the port kit was used with a syringe to  locate and cannulate the left subclavian vein. Once the vein was found and  the patient had already been placed in Trendelenburg, a wire was able to be  threaded through the needle using the Seldinger technique.  Without  difficulty, placement of the wire into the central venous system was  confirmed using the fluoroscopy unit.  Next, a subcutaneous pocket was then  made just lateral to the bend of the clavicle.  The port-A-Cath was placed  in the pocket to make sure it was appropriate size, and the port reservoir  fit nicely.  The length of the tubing was estimated and cut to fit.  Next,  the dilator and sheath were fed over the wire into the central venous  system.  The wire and dilator were then removed.  The catheter was placed  through the sheath, and the sheath  was cracked and slowly separated,  maintaining the catheter in place.  Once the sheath had been removed, the  catheter was seen to be positioned well.  Its placement was checked again  with fluoroscopy, and it seemed to be nicely in the central venous system  with no kinks.  The port was then anchored in its pocket using two  interrupted 2-0 Prolene stitches.  The port was then aspirated and flushed  with heparin solution. The subcutaneous tissue overlying the port was closed  with interrupted 3-0 Vicryl stitches, and the skin was closed with running 4-  0 Monocryl subcuticular stitch.  Benzoin, Steri-Strips, and sterile  dressings were applied.  The patient tolerated the procedure well.  At the  end of the case, all  needle, sponge, and instrument counts were correct.  The patient was taken to the recovery room in stable condition.  A chest x-  ray in the recovery room confirmed no pneumothorax and good placement of the  catheter.                                               Ollen Gross. Vernell Morgans, M.D.    PST/MEDQ  D:  02/02/2003  T:  02/03/2003  Job:  [id number]

## 2011-04-10 NOTE — Op Note (Signed)
[name], [name]                  ACCOUNT NO.:  [id number]   MEDICAL RECORD NO.:  [id number]          PATIENT TYPE:  INP   LOCATION:  2029                         FACILITY:  [facility]   PHYSICIAN:  John C. Madilyn Fireman, M.D.    DATE OF BIRTH:  1951/01/25   DATE OF PROCEDURE:  07/29/2006  DATE OF DISCHARGE:                                 OPERATIVE REPORT   PROCEDURE:  Esophagogastroduodenoscopy.   INDICATIONS FOR PROCEDURE:  Hematemesis and anemia with hemoglobin of 6.6  and known varices on CT scan.   PROCEDURE:  The patient was placed in the left lateral decubitus position  and placed on the pulse monitor with continuous low-flow oxygen delivered by  nasal cannula.  She was sedated with 100 mcg IV fentanyl and 10 mg IV  Versed.  Olympus video endoscope was advanced under direct vision into the  oropharynx and esophagus.  Esophagus was straight and of normal caliber with  squamocolumnar line at 38 cm.  There very large esophageal varices but they  was not completely circumferential with one column of varices, not a very  significantly enlarged.  There was no active bleeding and no definite stigma  of hemorrhage from the varices.  Stomach was entered.  A small amount of  liquid secretions were suctioned from the fundus.  There was no blood in the  fundus.  Retroflexed view of cardia appeared to show some proximal gastric  varices that were somewhat poorly delineated from the remainder of the  stomach where there was appearance of portal gastropathy that faded to  fairly normal appearance by the midbody.  The body and antrum appeared  normal.  The duodenum was entered both bulb and second portion were  inspected and appeared be within normal limits.  There was report on  previous CT scan about a duodenal mass but I did not appreciate this.  Due  to be the patient's hemoglobin being 6.6, unclear status of her coagulation  factors and she had not been typed and screened and she was not actively  bleeding, I elected not perform any endoscopic therapy at this time,  preferring to obtain INR, correct her anemia and if need be, her  coagulopathy and bring her back for banding tomorrow.  The scope was then  withdrawn and the patient returned to the recovery room in stable condition.  She tolerated the procedure well.  There were no immediate complications.   IMPRESSION:  1. Large esophageal varices, small gastric varices currently not bleeding.   PLAN:  Will keep in the hospital overnight, transfuse to correct coag  abnormalities and proceed with EGD with esophageal banding tomorrow.           ______________________________  [name] Madilyn Fireman, M.D.     JCH/MEDQ  D:  07/29/2006  T:  07/30/2006  Job:  [id number]   cc:   Fayrene Fearing L. Malon Kindle., M.D.  Genene Churn. Cyndie Chime, M.D.  [facility]

## 2011-04-10 NOTE — Op Note (Signed)
[name], [name]                         ACCOUNT NO.:  [id number]   MEDICAL RECORD NO.:  [id number]                   PATIENT TYPE:  INP   LOCATION:  0370                                 FACILITY:  [facility]   PHYSICIAN:  Ollen Gross. Vernell Morgans, M.D.              DATE OF BIRTH:  04/30/1951   DATE OF PROCEDURE:  01/08/2003  DATE OF DISCHARGE:  01/13/2003                                 OPERATIVE REPORT   PREOPERATIVE DIAGNOSIS:  Right colon cancer.   POSTOPERATIVE DIAGNOSIS:  Right colon cancer.   PROCEDURE:  Right hemicolectomy.   SURGEON:  Ollen Gross. Carolynne Edouard, M.D.   ASSISTANT:  Sheppard Plumber. Earlene Plater, M.D.   ANESTHESIA:  General endotracheal.   DESCRIPTION OF PROCEDURE:  After informed consent was obtained, the patient  was brought to the operating room and placed in a supine position on the  operating room table.  After adequate induction of general anesthesia, the  patient's abdomen was prepped with Betadine and draped in the usual sterile  manner.  A central midline incision was made with the 15 blade knife.  This  incision was carried down through the skin and subcutaneous tissue with the  Bovie electrocautery.  The linea alba was identified and incised with the  electrocautery.  The preperitoneal space was then probed bluntly with a  hemostat until the peritoneum was opened.  The rest of the incision was then  opened under direct vision using electrocautery.  The right colon was then  mobilized by incising its retroperitoneal attachment along the white line of  Toldt.  Once the right colon had been mobilized, the tumor was readily  identifiable.  There were palpably enlarged nodes in the mesentery, and a  site was chosen on the terminal ileum for division of the bowel, and the  mesentery was opened at that point bluntly with the hemostat and the  electrocautery.  A GI stapling device was placed across the terminal ileum  and fired, dividing the bowel at that point.  A site was then  chosen  distally at the hepatic flexure for division of the bowel at that point and  again, the mesentery was opened bluntly with the hemostat and the  electrocautery.  The GI stapler was placed across the bowel at this point  and fired, stapling and dividing the bowel.  The mesentery was then scored  with the electrocautery, and the mesentery to the right colon was taken down  near its base.  This was done by serially clamping, dividing, and ligating  with 2-0 silk ties all of the vessels feeding this portion of the bowel.  Once this was complete, a specimen was able to be removed from the patient  and sent to pathology.  The abdomen was then irrigated with copious amounts  of saline.  The mesenteric defect at this point was then closed with  interrupted 2-0 silk stitches.  The  antimesenteric borders of the terminal  ileum and proximal transverse colon were reapproximated and held together  using an anchoring 3-0 silk stitch.  Small enterotomies were made on the  antimesenteric border of each of the terminal ileum and proximal transverse  colon.  Another GIA stapler was used, and each limb of the stapler was  placed down each limb of the bowel and clamped and fired.  This created a  nice, widely patent enteroenterostomy.  The enterotomy was then closed with  interrupted 3-0 silk stitches without difficulty.  Once this was  accomplished, the anastomosis was widely patent and hemostatic.  The rest of  the abdomen was inspected.  There was no obvious metastatic disease in the  way of implants there in the omentum or on the abdominal wall.  The liver  was palpated and felt normal.  The pelvis seemed to be clear.  The bowel was  run in its entirety, and there were no other lesions found.  The abdomen was  again irrigated with copious amounts of saline.  Gloves were changed, and  the anterior abdominal wall fascia was closed using two running #1 PDS  sutures.  The subcutaneous tissue was then  irrigated  with copious amounts of saline, and the skin was closed with staples.  The  patient tolerated the procedure well.  At the end of the case, all needle,  sponge, and instrument counts were correct.  Sterile dressings were applied,  and the patient was taken to the recovery room in stable condition.                                               Ollen Gross. Vernell Morgans, M.D.    PST/MEDQ  D:  01/21/2003  T:  01/21/2003  Job:  [id number]

## 2011-04-10 NOTE — H&P (Signed)
[name] Park, [name] Park                  ACCOUNT NO.:  [id number]   MEDICAL RECORD NO.:  [id number]          PATIENT TYPE:  INP   LOCATION:  2312                         FACILITY:  [facility]   PHYSICIAN:  Fayrene Fearing L. Malon Kindle., M.D.DATE OF BIRTH:  1951-06-19   DATE OF ADMISSION:  08/24/2006  DATE OF DISCHARGE:                                HISTORY & PHYSICAL   REASON FOR ADMISSION:  Acute variceal bleed.   HISTORY:  Patient is a 60 year old woman who has a history of colon cancer  and underwent a right hemicolectomy in 2004.  She received some sort of  chemotherapy that caused elevated liver enzymes and probably caused  cirrhosis.  The exact type of chemotherapy is unclear.  She was admitted  earlier this month with an acute episode of hematemesis and found to have  massive varices.  She underwent banding by Dr. Madilyn Fireman and is brought back in  electively for repeat band.  Banding was attempted in the endoscopy unit.  The first bander that was placed on the [facility] [employer] bander would not  function correctly, and the scope had to be removed and replaced.  The  patient did have massive varices.  We went back with a second bander and  placed several bands, and apparently somewhere during this procedure either  one of the bands or the scope had irritated a varix and it began to bleed.  We had to withdraw the scope and place a third bander and went back and  placed several bands, but never could completely control the bleeding.  At  the termination of procedure, the patient's vital signs were stable; but it  was not clear that her bleeding had stopped, and it was felt that she needed  to be admitted to the ICU for octreotide therapy and possibly a TIPS  procedure if the bleeding continues.   MEDICATIONS ON ADMISSION:  None.   ALLERGIES:  SHE IS ALLERGIC TO IVP DYE AND CT SCAN DYE AND NO OTHER  ALLERGIES.   MEDICAL HISTORY:  She has a history of liver disease  that began after  chemotherapy  for her colon cancer and has a diagnosis of liver damage from  the chemotherapy.  She underwent a right hemicolectomy in 2004 for cancer of  the colon and had extensive chemotherapy by Dr. Arline Asp.  The issues with  the liver disease have not really been addressed at this point.  A liver  biopsy had been discussed, but the family did not want to undergo that at  this time.  She has no other surgeries.  No chronic medical problems.   SOCIAL HISTORY:  Lives with her husband.  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   PHYSICAL EXAMINATION:  GENERAL:  The patient has just had the endoscopy.  She is still sedated, but alert and responsive.  Denies any pain.  There is  blood in her mouth and on the bed where she had vomited up some blood during  the procedure.  VITAL SIGNS:  Her  O2 sats are 100% on a rebreathing mask.  Blood pressure  113/80, and pulse is 70 at this time.  LUNGS:  Clear.  HEART:  Regular rate and rhythm without murmurs or gallops.  ABDOMEN:  Soft and nontender with no ascites present.   LABS:  None available currently.   ASSESSMENT:  Acute variceal bleed, exacerbated during outpatient elective  banding.  The patient does have large varices.  I think some bands were  placed; and, hopefully, the bleeding will be controlled, and we can go back  and do more if we need to.  Right now, she appears to be bleeding and I  think needs to be admitted on octreotide with consideration for a  transjugular intrahepatic portosystemic shunt (TIPS) procedure if the  bleeding continues.           ______________________________  [name] Malon Kindle., M.D.     Waldron Session  D:  08/24/2006  T:  08/25/2006  Job:  [id number]   cc:   Genene Churn. Cyndie Chime, M.D.  Darlin Priestly, MD  Gretta Arab Valentina Lucks, M.D.  James L. Malon Kindle., M.D.

## 2011-04-10 NOTE — Op Note (Signed)
   [name], [name]                            ACCOUNT NO.:  [id number]   MEDICAL RECORD NO.:  [id number]                   PATIENT TYPE:  AMB   LOCATION:  ENDO                                 FACILITY:  [facility]   PHYSICIAN:  James L. Malon Kindle., M.D.          DATE OF BIRTH:  01-13-1951   DATE OF PROCEDURE:  12/26/2002  DATE OF DISCHARGE:                                 OPERATIVE REPORT   PROCEDURE:  Colonoscopy with biopsy.   PREMEDICATION:  Fentanyl 90 mcg, Versed 9 mg IV.   INSTRUMENT USED:  Olympus pediatric adjustable colonoscope.   INDICATIONS FOR PROCEDURE:  Iron deficiency anemia and heme positive stool.   DESCRIPTION OF PROCEDURE:  The procedure had been explained to the patient  and consent obtained. With the patient in the left lateral decubitus  position, the scope was inserted and advanced. It easily reached the cecum.  Unfortunately, there was a large friable ulcerated mass in the cecum.  Multiple biopsies were obtained.  The scope was withdrawn and the cecum,  ascending colon, hepatic flexure, transverse colon, splenic flexure,  descending, and sigmoid colon were seen well and no further polyps were  seen. The scope was withdrawn and the patient tolerated the procedure well.   ASSESSMENT:  Cecal cancer.   PLAN:  Will check pathology.  Obtain a CT scan and labs.  Get surgical  consultation.                                               James L. Malon Kindle., M.D.    Waldron Session  D:  12/26/2002  T:  12/26/2002  Job:  [id number]   cc:   S. Kyra Manges, M.D.  [phone] [address]  [place]  [place] [address]  Fax: [phone]

## 2011-05-03 ENCOUNTER — Emergency Department (HOSPITAL_COMMUNITY)
Admission: EM | Admit: 2011-05-03 | Discharge: 2011-05-04 | Discharge status: Against Medical Advice | Payer: BC Managed Care – HMO | Attending: Emergency Medicine | Admitting: Emergency Medicine

## 2011-05-03 DIAGNOSIS — I8500 Esophageal varices without bleeding: Secondary | ICD-10-CM | POA: Insufficient documentation

## 2011-05-03 DIAGNOSIS — K920 Hematemesis: Secondary | ICD-10-CM | POA: Insufficient documentation

## 2011-05-04 DIAGNOSIS — K922 Gastrointestinal hemorrhage, unspecified: Secondary | ICD-10-CM

## 2011-05-04 HISTORY — DX: Gastrointestinal hemorrhage, unspecified: K92.2

## 2011-08-28 LAB — COMPREHENSIVE METABOLIC PANEL
ALT: 33
AST: 59 — ABNORMAL HIGH
Albumin: 3.5
Alkaline Phosphatase: 74
BUN: 11
CO2: 28
Calcium: 8.8
Chloride: 104
Creatinine, Ser: 0.89
GFR calc Af Amer: >60
GFR calc non Af Amer: >60
Glucose, Bld: 105 — ABNORMAL HIGH
Potassium: 4.1
Sodium: 140
Total Bilirubin: 1.1
Total Protein: 6.8

## 2011-08-28 LAB — APTT: aPTT: 33

## 2011-08-28 LAB — PROTIME-INR: INR: 1.2

## 2011-10-09 ENCOUNTER — Other Ambulatory Visit: Payer: Self-pay

## 2011-10-09 DIAGNOSIS — C180 Malignant neoplasm of cecum: Secondary | ICD-10-CM

## 2011-10-12 ENCOUNTER — Telehealth: Payer: Self-pay | Admitting: Oncology

## 2011-10-12 NOTE — Telephone Encounter (Signed)
lmonvm advising the pt of her r/s appts from July for jan

## 2011-11-13 ENCOUNTER — Telehealth: Payer: Self-pay | Admitting: Oncology

## 2011-11-13 NOTE — Telephone Encounter (Signed)
l/m with new appt info and also mailed////aom

## 2011-11-30 ENCOUNTER — Ambulatory Visit (HOSPITAL_BASED_OUTPATIENT_CLINIC_OR_DEPARTMENT_OTHER): Payer: 59 | Admitting: Oncology

## 2011-11-30 ENCOUNTER — Other Ambulatory Visit (HOSPITAL_BASED_OUTPATIENT_CLINIC_OR_DEPARTMENT_OTHER): Payer: 59 | Admitting: Lab

## 2011-11-30 ENCOUNTER — Other Ambulatory Visit: Payer: BC Managed Care – HMO | Admitting: Lab

## 2011-11-30 ENCOUNTER — Telehealth: Payer: Self-pay | Admitting: Oncology

## 2011-11-30 ENCOUNTER — Encounter: Payer: Self-pay | Admitting: Oncology

## 2011-11-30 ENCOUNTER — Ambulatory Visit: Payer: BC Managed Care – HMO | Admitting: Oncology

## 2011-11-30 VITALS — BP 113/65 | HR 67 | Temp 97.1°F | Wt 202.7 lb

## 2011-11-30 DIAGNOSIS — Z85038 Personal history of other malignant neoplasm of large intestine: Secondary | ICD-10-CM | POA: Insufficient documentation

## 2011-11-30 DIAGNOSIS — C189 Malignant neoplasm of colon, unspecified: Secondary | ICD-10-CM | POA: Insufficient documentation

## 2011-11-30 DIAGNOSIS — C180 Malignant neoplasm of cecum: Secondary | ICD-10-CM

## 2011-11-30 DIAGNOSIS — K7460 Unspecified cirrhosis of liver: Secondary | ICD-10-CM

## 2011-11-30 LAB — COMPREHENSIVE METABOLIC PANEL
ALT: 15 U/L (ref 0–35)
AST: 28 U/L (ref 0–37)
Albumin: 3.4 g/dL — ABNORMAL LOW (ref 3.5–5.2)
CO2: 27 mEq/L (ref 19–32)
Calcium: 9.2 mg/dL (ref 8.4–10.5)
Chloride: 107 mEq/L (ref 96–112)
Creatinine, Ser: 1 mg/dL (ref 0.50–1.10)
Potassium: 3.6 mEq/L (ref 3.5–5.3)
Total Protein: 7.2 g/dL (ref 6.0–8.3)

## 2011-11-30 LAB — CBC WITH DIFFERENTIAL/PLATELET
BASO%: 1 % (ref 0.0–2.0)
HCT: 32.4 % — ABNORMAL LOW (ref 34.8–46.6)
HGB: 11.4 g/dL — ABNORMAL LOW (ref 11.6–15.9)
MCHC: 35.2 g/dL (ref 31.5–36.0)
MONO#: 0.2 10*3/uL (ref 0.1–0.9)
NEUT%: 52.6 % (ref 38.4–76.8)
RDW: 14.6 % — ABNORMAL HIGH (ref 11.2–14.5)
WBC: 2.9 10*3/uL — ABNORMAL LOW (ref 3.9–10.3)
lymph#: 0.9 10*3/uL (ref 0.9–3.3)

## 2011-11-30 LAB — LACTATE DEHYDROGENASE: LDH: 234 U/L (ref 94–250)

## 2011-11-30 NOTE — Progress Notes (Signed)
This office note has been dictated.  #[id number]

## 2011-11-30 NOTE — Telephone Encounter (Signed)
78yr appt made/printed for 11/28/12   aom

## 2011-12-01 NOTE — Progress Notes (Signed)
CC:   Ollen Gross. Vernell Morgans, M.D. James L. Malon Kindle., M.D. Katherine Roan, M.D. Woodfin Ganja, M.D.  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  The patient was rendered free of disease by her surgery.  She then completed 12 cycles of adjuvant FOLFOX from 02/05/2003 through 09/24/2003.  She remains disease free.  The patient is here today with her husband, [name].  She was last seen by [place] a year ago.  Unfortunately, it has been a rather eventful year for the patient.  She required admission to [facility] from 12/19/2010 through 12/25/2010 with a left lower lobe community-acquired pneumonia. She then required an admission to [facility] from approximately June 10th through July 11th of last year with severe variceal bleeding and associated complications.  Apparently a Blakemore tube needed to be placed, and the patient developed an esophageal leak that required a left-sided VATS with chest tubes.  On June 12th, she underwent a TIPS procedure.  She was critically ill during her admission and was in the ICU.  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  At the present time, she is doing quite well.  She has not had any recurrent GI bleeding.  Her mental status seems to be good.  There has been no jaundice.  The patient feels well with good energy and appetite, stable weight and has not had any complications from her underlying liver disease which became evident in the mid and late part of 2004.  Unfortunately, it is felt that the patient's cirrhosis is due to oxaliplatin.  In any event, the patient has remained disease free from her colon cancer now approaching 9 years from  the time of diagnosis.  The CEAs have been normal.  Her last colonoscopy was on 06/25/2010.  PROBLEM LIST: 1. Moderately differentiated adenocarcinoma of the cecum associated     with matted lymph nodes and a mass that measured 4 cm, stage T3 N1     M0, stage IIIB status post 12 cycles of adjuvant FOLFOX from     02/05/2003 through 09/24/2003.  The patient remains free of     disease. 2. 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  Liver biopsies were     carried out in May 2010 showing hepatic sinusoidal obstruction.  An     MRI of the abdomen on 08/09/2009 showed a nodular liver without     masses.  There was evidence of portal hypertension.  There was no     ascites.  Spleen measured 14.6 cm. 3. Recurrent variceal bleeding status post TIPS (transjugular     intrahepatic portacaval shunt) procedure on 05/05/2011 at [facility]. 4. Mild pancytopenia secondary to hypersplenism. 5. Rosacea.  MEDICATIONS: 1. Nexium 40 mg daily. 2. Vitamin C 500 mg daily.  PHYSICAL EXAM:  General:  The patient looks well.  Her weight today is 202 pounds, 11.2 ounces as compared with 212.5 pounds on 11/27/2010. Height 5 feet, 5-1/2 inches.  Vital Signs:  Blood pressure 113/65. Other vital signs are normal.  HEENT/Neck:  There is no scleral icterus. Mouth and pharynx are benign.  The patient has some mild rosacea.  No  adenopathy in the neck.  She has probably at least 1 spider angioma in the neck region.  Heart/Lungs:  Normal.  Abdomen:  Benign with no organomegaly or masses palpable.  I am not able to appreciate any ascites masses or organomegaly.  Patient's surgical incisions are healed.  She did have a gastrostomy at 1 point, when she was at [facility], and the tube site is well healed.  Extremities:  No peripheral edema, clubbing, petechiae or purpura.  The patient does have cherry angiomas over her abdomen.  These look like petechiae, but I do not think they are.  Neurologic:  Exam is grossly normal.  The  patient's mental functioning seems to be normal.  LABORATORY DATA:  White count 2.9, ANC 1.5, hemoglobin 11.4, hematocrit 32.4, platelets 140,000.  Differential was normal except for 8.6% eosinophils.  Chemistries from today were normal except for an albumin of 3.4.  Liver function tests were normal as were electrolytes.  BUN 14, creatinine 1.00.  CEA today is pending.  CEA from 11/27/2010 was 2.5. The patient has not had any recent scans carried out in [place].  I do see a CT scan of the abdomen and pelvis from 06/06/2007 that showed no evidence for metastatic disease; however, there was splenomegaly with abdominal and esophageal varices and cirrhosis.  IMPRESSION AND PLAN:  [name] is now out almost 9 years from the time of her diagnosis in February of 2004.  There is no evidence for disease. She is being followed at [facility] for her cirrhosis and associated problems. At the present time, she is doing well  with regard to her liver disease.  I have urged her to follow up with her doctors at [facility].  As stated, her last colonoscopy was on 06/25/2010.  I am sure she had a multitude of imaging studies while she was hospitalized at [facility] during the month of June and July of this year.  I told the patient that she really did not need a follow up with me at this time.  However, I would be more than happy to continue to follow her.  On that basis, an appointment was made for 1 year.  We will check CBC, chemistries and CEA.    ______________________________ Samul Dada, M.D. DSM/MEDQ  D:  11/30/2011  T:  11/30/2011  Job:  [id number]

## 2012-10-25 ENCOUNTER — Other Ambulatory Visit: Payer: Self-pay | Admitting: Oncology

## 2012-10-25 DIAGNOSIS — Z1231 Encounter for screening mammogram for malignant neoplasm of breast: Secondary | ICD-10-CM

## 2012-11-28 ENCOUNTER — Encounter: Payer: Self-pay | Admitting: Oncology

## 2012-11-28 ENCOUNTER — Ambulatory Visit (HOSPITAL_BASED_OUTPATIENT_CLINIC_OR_DEPARTMENT_OTHER): Payer: 59 | Admitting: Oncology

## 2012-11-28 ENCOUNTER — Other Ambulatory Visit (HOSPITAL_BASED_OUTPATIENT_CLINIC_OR_DEPARTMENT_OTHER): Payer: 59 | Admitting: Lab

## 2012-11-28 VITALS — BP 122/66 | HR 70 | Temp 97.0°F | Resp 20 | Ht 65.0 in | Wt 227.6 lb

## 2012-11-28 DIAGNOSIS — Z85038 Personal history of other malignant neoplasm of large intestine: Secondary | ICD-10-CM

## 2012-11-28 DIAGNOSIS — C189 Malignant neoplasm of colon, unspecified: Secondary | ICD-10-CM

## 2012-11-28 LAB — COMPREHENSIVE METABOLIC PANEL (CC13)
ALT: 31 U/L (ref 0–55)
Alkaline Phosphatase: 110 U/L (ref 40–150)
CO2: 26 mEq/L (ref 22–29)
Creatinine: 1 mg/dL (ref 0.6–1.1)
Sodium: 143 mEq/L (ref 136–145)
Total Bilirubin: 0.95 mg/dL (ref 0.20–1.20)
Total Protein: 7.5 g/dL (ref 6.4–8.3)

## 2012-11-28 LAB — CBC WITH DIFFERENTIAL/PLATELET
BASO%: 0.9 % (ref 0.0–2.0)
EOS%: 4.8 % (ref 0.0–7.0)
LYMPH%: 36.1 % (ref 14.0–49.7)
MCH: 32.8 pg (ref 25.1–34.0)
MCHC: 34.6 g/dL (ref 31.5–36.0)
MCV: 95 fL (ref 79.5–101.0)
MONO%: 7.6 % (ref 0.0–14.0)
NEUT#: 1.8 10*3/uL (ref 1.5–6.5)
Platelets: 186 10*3/uL (ref 145–400)
RBC: 3.93 10*6/uL (ref 3.70–5.45)
RDW: 13.6 % (ref 11.2–14.5)

## 2012-11-28 LAB — LACTATE DEHYDROGENASE (CC13): LDH: 267 U/L — ABNORMAL HIGH (ref 125–245)

## 2012-11-28 NOTE — Progress Notes (Signed)
This office note has been dictated.  #[id number]

## 2012-11-29 ENCOUNTER — Ambulatory Visit
Admission: RE | Admit: 2012-11-29 | Discharge: 2012-11-29 | Disposition: A | Discharge status: Home / Self Care | Payer: 59 | Source: Ambulatory Visit | Attending: Oncology | Admitting: Oncology

## 2012-11-29 ENCOUNTER — Telehealth: Payer: Self-pay | Admitting: Oncology

## 2012-11-29 DIAGNOSIS — Z1231 Encounter for screening mammogram for malignant neoplasm of breast: Secondary | ICD-10-CM

## 2012-11-29 LAB — CEA: CEA: 2.8 ng/mL (ref 0.0–5.0)

## 2012-11-29 NOTE — Telephone Encounter (Signed)
called and l.m with 11/30/13 appt

## 2012-11-29 NOTE — Progress Notes (Signed)
CC:   [name]. [name], M.D. [name]., M.D. [name], M.D. Woodfin Ganja, MD, Fax [phone]  PROBLEM LIST:  1. 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 The patient remains free of  disease.  2. 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 Liver biopsies were  carried out in May 2010 showing hepatic sinusoidal obstruction. An  MRI of the abdomen on 08/09/2009 showed a nodular liver without  masses. There was evidence of portal hypertension. There was no  ascites. Spleen measured 14.6 cm.  3. Recurrent variceal bleeding status post TIPS (transjugular  intrahepatic portacaval shunt) procedure on 05/05/2011 at [facility].  4. Mild pancytopenia secondary to hypersplenism.  5. Rosacea.   MEDICATIONS:  Reviewed and recorded.  Current Outpatient Prescriptions  Medication Sig Dispense Refill  . esomeprazole (NEXIUM) 40 MG capsule Take 40 mg by mouth 2 (two) times daily.      Marland Kitchen glucosamine-chondroitin 500-400 MG tablet Take 1 tablet by mouth 2 (two) times daily.      . Multiple Vitamin (MULTIVITAMIN) capsule Take 1 capsule by mouth daily.      . vitamin C (ASCORBIC ACID) 500 MG tablet Take 500 mg by mouth daily.        SMOKING HISTORY:  The patient has never smoked cigarettes.   HISTORY:  [name] was seen today for followup of her stage IIIB (T3 N1 M0) moderately differentiated adenocarcinoma of the cecum associated with matted lymph nodes and a mass that measured 4 cm at the time of surgery which was carried out on 01/08/2003.  The patient was rendered disease-free by her surgery and then received 12 cycles of adjuvant FOLFOX from 02/05/2003 through 09/24/2003.  The patient remains free of disease at this time.  She is accompanied today by her husband, [name].  The patient and  her husband were last seen by me on 11/30/2011.  Fortunately, the patient has had a fairly uneventful year, unlike 2012 when she had 2 hospitalizations, 1 at [facility] in late January through early February for a left lower lobe community-acquired pneumonia and the other at [facility] for approximately 1 month in mid June through mid July with severe esophageal variceal bleeding and other associated complications.  The patient really is without any major complaints today and feels generally well.  There are no symptoms to suggest recurrent colon cancer.  She denies any evidence of blood in her stools.  She has a little residual numbness in her toes from the oxaliplatin. The patient denies any pain.  PHYSICAL EXAMINATION:  General:  The patient looks well.  Weight today is 227.6 pounds, height 5 feet 5 inches, body surface area 2.18 sq m. Vital Signs:  Blood pressure 122/66.  Other vital signs are normal. HEENT:  There is no scleral icterus.  Mouth and pharynx are benign. Lymph nodes:  There is no peripheral adenopathy palpable.  Heart and lungs:  Normal.  Abdomen:  Benign with no organomegaly or masses palpable.  No ascites or abdominal masses.  No stigmata of liver disease.  Extremities:  No peripheral edema or calf tenderness.  The patient does not have a Port-A-Cath or central catheter.  Neurologic: Normal.  LABORATORY DATA:  Today, white count 3.6, ANC 1.8, hemoglobin 12.9, hematocrit 37.3, platelets 186,000.  Chemistries and CEA from today  are pending.  Chemistries from 11/30/2011 were notable for an albumin of 3.4, otherwise normal.  Liver function tests were normal.  CEA on 11/30/2011 was 2.9.  IMAGING STUDIES:  Portable 1-view chest x-ray from 12/25/2010 showed increased density in the left hemithorax suggesting worsening pleural effusion and/or airspace disease.  PROCEDURES:  The patient's last colonoscopy was carried out by Dr. Carman Ching on 06/25/2010.  IMPRESSION AND  PLAN:  Patient is out almost 10 years from the time of diagnosis in February 2004,  There is no evidence for recurrent disease. She is followed at [facility] every 6 months for her cirrhosis and associated problems.  The patient has done well over the past year since her last visit here on 11/30/2011.  Of note is the patient's last colonoscopy on 06/25/2010.  She tells me that there were no polyps seen.  I suspect that her next colonoscopy will be at a 5-year interval, somewhere around the summer of 2016.  The patient would like to be seen in 1 year.  We will plan to check CBC and chemistries at that time.    ______________________________ Samul Dada, M.D. DSM/MEDQ  D:  11/28/2012  T:  11/29/2012  Job:  [id number]

## 2012-12-01 ENCOUNTER — Other Ambulatory Visit: Payer: Self-pay | Admitting: Oncology

## 2012-12-01 DIAGNOSIS — C189 Malignant neoplasm of colon, unspecified: Secondary | ICD-10-CM

## 2012-12-01 DIAGNOSIS — R928 Other abnormal and inconclusive findings on diagnostic imaging of breast: Secondary | ICD-10-CM

## 2012-12-02 ENCOUNTER — Telehealth: Payer: Self-pay

## 2012-12-02 NOTE — Telephone Encounter (Signed)
lvm that LDH is increased and dr Arline Asp wants pt to have lab in 1 month. Pt to expect call from schedulers

## 2012-12-07 ENCOUNTER — Telehealth: Payer: Self-pay | Admitting: Oncology

## 2012-12-07 NOTE — Telephone Encounter (Signed)
lvm for pt regarding Feb 6th appt...Marland KitchenMarland Kitchen

## 2012-12-15 ENCOUNTER — Ambulatory Visit
Admission: RE | Admit: 2012-12-15 | Discharge: 2012-12-15 | Disposition: A | Discharge status: Home / Self Care | Payer: 59 | Source: Ambulatory Visit | Attending: Oncology | Admitting: Oncology

## 2012-12-15 DIAGNOSIS — R928 Other abnormal and inconclusive findings on diagnostic imaging of breast: Secondary | ICD-10-CM

## 2012-12-29 ENCOUNTER — Other Ambulatory Visit (HOSPITAL_BASED_OUTPATIENT_CLINIC_OR_DEPARTMENT_OTHER): Payer: 59 | Admitting: Lab

## 2012-12-29 DIAGNOSIS — C189 Malignant neoplasm of colon, unspecified: Secondary | ICD-10-CM

## 2012-12-29 LAB — LACTATE DEHYDROGENASE (CC13): LDH: 248 U/L — ABNORMAL HIGH (ref 125–245)

## 2012-12-30 ENCOUNTER — Telehealth: Payer: Self-pay | Admitting: Medical Oncology

## 2012-12-30 NOTE — Telephone Encounter (Signed)
I called pt per Dr. Mamie Levers request to inform her that her LDH is ok. I asked her to call if any questions or concerns.

## 2013-11-29 ENCOUNTER — Telehealth: Payer: Self-pay | Admitting: Internal Medicine

## 2013-11-29 NOTE — Telephone Encounter (Signed)
s.w pt called and needed to r/s appts...done

## 2013-11-30 ENCOUNTER — Ambulatory Visit: Payer: 59

## 2013-11-30 ENCOUNTER — Other Ambulatory Visit: Payer: 59

## 2013-12-12 ENCOUNTER — Other Ambulatory Visit: Payer: Self-pay | Admitting: Internal Medicine

## 2013-12-12 ENCOUNTER — Other Ambulatory Visit (HOSPITAL_BASED_OUTPATIENT_CLINIC_OR_DEPARTMENT_OTHER): Payer: 59

## 2013-12-12 DIAGNOSIS — C189 Malignant neoplasm of colon, unspecified: Secondary | ICD-10-CM

## 2013-12-12 DIAGNOSIS — Z85038 Personal history of other malignant neoplasm of large intestine: Secondary | ICD-10-CM

## 2013-12-12 LAB — COMPREHENSIVE METABOLIC PANEL (CC13)
ALT: 24 U/L (ref 0–55)
AST: 39 U/L — AB (ref 5–34)
Albumin: 3.8 g/dL (ref 3.5–5.0)
Alkaline Phosphatase: 99 U/L (ref 40–150)
Anion Gap: 8 mEq/L (ref 3–11)
BUN: 11.7 mg/dL (ref 7.0–26.0)
CO2: 27 mEq/L (ref 22–29)
CREATININE: 0.9 mg/dL (ref 0.6–1.1)
Calcium: 9.7 mg/dL (ref 8.4–10.4)
Chloride: 107 mEq/L (ref 98–109)
Glucose: 87 mg/dl (ref 70–140)
Potassium: 3.8 mEq/L (ref 3.5–5.1)
Sodium: 143 mEq/L (ref 136–145)
Total Bilirubin: 1.06 mg/dL (ref 0.20–1.20)
Total Protein: 7.6 g/dL (ref 6.4–8.3)

## 2013-12-12 LAB — CBC WITH DIFFERENTIAL/PLATELET
BASO%: 1.1 % (ref 0.0–2.0)
Basophils Absolute: 0 10*3/uL (ref 0.0–0.1)
EOS%: 5.3 % (ref 0.0–7.0)
Eosinophils Absolute: 0.2 10*3/uL (ref 0.0–0.5)
HCT: 37.9 % (ref 34.8–46.6)
HGB: 12.9 g/dL (ref 11.6–15.9)
LYMPH%: 31.8 % (ref 14.0–49.7)
MCH: 33.1 pg (ref 25.1–34.0)
MCHC: 33.9 g/dL (ref 31.5–36.0)
MCV: 97.4 fL (ref 79.5–101.0)
MONO#: 0.4 10*3/uL (ref 0.1–0.9)
MONO%: 8.4 % (ref 0.0–14.0)
NEUT#: 2.3 10*3/uL (ref 1.5–6.5)
NEUT%: 53.4 % (ref 38.4–76.8)
PLATELETS: 192 10*3/uL (ref 145–400)
RBC: 3.89 10*6/uL (ref 3.70–5.45)
RDW: 13.5 % (ref 11.2–14.5)
WBC: 4.2 10*3/uL (ref 3.9–10.3)
lymph#: 1.3 10*3/uL (ref 0.9–3.3)

## 2013-12-12 LAB — LACTATE DEHYDROGENASE (CC13): LDH: 293 U/L — AB (ref 125–245)

## 2013-12-13 LAB — CEA: CEA: 2.9 ng/mL (ref 0.0–5.0)

## 2013-12-14 ENCOUNTER — Telehealth: Payer: Self-pay | Admitting: Internal Medicine

## 2013-12-14 ENCOUNTER — Encounter (INDEPENDENT_AMBULATORY_CARE_PROVIDER_SITE_OTHER): Payer: Self-pay

## 2013-12-14 ENCOUNTER — Ambulatory Visit (HOSPITAL_BASED_OUTPATIENT_CLINIC_OR_DEPARTMENT_OTHER): Payer: 59 | Admitting: Internal Medicine

## 2013-12-14 VITALS — BP 141/64 | HR 74 | Temp 97.2°F | Resp 19 | Ht 66.0 in | Wt 242.0 lb

## 2013-12-14 DIAGNOSIS — C189 Malignant neoplasm of colon, unspecified: Secondary | ICD-10-CM

## 2013-12-14 DIAGNOSIS — Z85038 Personal history of other malignant neoplasm of large intestine: Secondary | ICD-10-CM

## 2013-12-14 NOTE — Progress Notes (Signed)
[place] OFFICE PROGRESS NOTE  [name], MD [address], [address] [place] [address]  DIAGNOSIS: Colon cancer  Chief Complaint  Patient presents with  . Colon Cancer    CURRENT THERAPY:  Observation.  INTERVAL HISTORY: [name] 63 y.o. female with a history of stage IIIB  (T3 N1 M0) moderately differentiated adenocarcinoma of the cecum associated with matted lymph nodes and a mass that measured 4 cm at the time of surgery which was carried out on 01/08/2003 is here for annual follow-up.  She was last seen by Dr. Ralene Ok on 11/28/2012.  She was rendered disease-free by her surgery and then received 12 cycles of adjuvant FOLFOX from 02/05/2003 through 09/24/2003. The patient remains free of disease at this time. She is accompanied today by her husband, [name].Over the past one year, she denies any hospitalizations or emergency room visits.  She  is without any major complaints today and feels generally well. There are no symptoms to suggest recurrent colon cancer. She denies any evidence of blood in her stools. She has a little residual numbness in her toes from the oxaliplatin. The patient denies any pain.   MEDICAL HISTORY: Past Medical History  Diagnosis Date  . Malignant neoplasm of cecum   . Iron deficiency anemia   . Splenomegaly   . Portal hypertension   . Elevated liver function tests   . Cancer     cecum    INTERIM HISTORY: has Colon cancer on her problem list.    ALLERGIES:  is allergic to contrast media.  MEDICATIONS: has a current medication list which includes the following prescription(s): glucosamine-chondroitin, multivitamin, and vitamin c.  SURGICAL HISTORY:  Past Surgical History  Procedure Laterality Date  . Hemicolectomy  01/08/03   PROBLEM LIST:  1. Moderately differentiated adenocarcinoma of the cecum associated with matted lymph nodes and a mass that measured 4 cm, stage T3 N1 M0, stage IIIB status post 12  cycles of adjuvant FOLFOX from 02/05/2003 through 09/24/2003. The patient remains free of disease.  2. 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 Liver biopsies were carried out in May 2010 showing hepatic sinusoidal obstruction. An MRI of the abdomen on 08/09/2009 showed a nodular liver without masses. There was evidence of portal hypertension. There was no ascites. Spleen measured 14.6 cm.  3. Recurrent variceal bleeding status post TIPS (transjugular  intrahepatic portacaval shunt) procedure on 05/05/2011 at [facility].  4. Mild pancytopenia secondary to hypersplenism.  5. Rosacea.  REVIEW OF SYSTEMS:   Constitutional: Denies fevers, chills or abnormal weight loss Eyes: Denies blurriness of vision Ears, nose, mouth, throat, and face: Denies mucositis or sore throat Respiratory: Denies cough, dyspnea or wheezes Cardiovascular: Denies palpitation, chest discomfort or lower extremity swelling Gastrointestinal:  Denies nausea, heartburn or change in bowel habits Skin: Denies abnormal skin rashes Lymphatics: Denies new lymphadenopathy or easy bruising Neurological:Denies numbness, tingling or new weaknesses Behavioral/Psych: Mood is stable, no new changes  All other systems were reviewed with the patient and are negative.  PHYSICAL EXAMINATION: ECOG PERFORMANCE STATUS: 0 - Asymptomatic  Blood pressure 141/64, pulse 74, temperature 97.2 F (36.2 C), temperature source Oral, resp. rate 19, height 5\' 6"  (1.676 m), weight 242 lb (109.77 kg), SpO2 100.00%.  GENERAL:alert, no distress and comfortable; Obese.  SKIN: skin color, texture, turgor are normal, no rashes or significant lesions EYES: normal, Conjunctiva are pink and non-injected, sclera clear OROPHARYNX:no exudate, no erythema and lips, buccal mucosa, and tongue normal  NECK:  supple, thyroid normal size, non-tender, without nodularity LYMPH:  no palpable lymphadenopathy in the cervical, axillary or supraclavicular LUNGS: clear to  auscultation with normal breathing effort HEART: regular rate & rhythm and no murmurs and no lower extremity edema ABDOMEN:abdomen soft, non-tender and normal bowel sounds Musculoskeletal:no cyanosis of digits and no clubbing  NEURO: alert & oriented x 3 with fluent speech, no focal motor/sensory deficits   LABORATORY DATA: Results for orders placed in visit on 12/12/13 (from the past 48 hour(s))  CBC WITH DIFFERENTIAL     Status: None   Collection Time    12/12/13  3:49 PM      Result Value Range   WBC 4.2  3.9 - 10.3 10e3/uL   NEUT# 2.3  1.5 - 6.5 10e3/uL   HGB 12.9  11.6 - 15.9 g/dL   HCT 37.9  34.8 - 46.6 %   Platelets 192  145 - 400 10e3/uL   MCV 97.4  79.5 - 101.0 fL   MCH 33.1  25.1 - 34.0 pg   MCHC 33.9  31.5 - 36.0 g/dL   RBC 3.89  3.70 - 5.45 10e6/uL   RDW 13.5  11.2 - 14.5 %   lymph# 1.3  0.9 - 3.3 10e3/uL   MONO# 0.4  0.1 - 0.9 10e3/uL   Eosinophils Absolute 0.2  0.0 - 0.5 10e3/uL   Basophils Absolute 0.0  0.0 - 0.1 10e3/uL   NEUT% 53.4  38.4 - 76.8 %   LYMPH% 31.8  14.0 - 49.7 %   MONO% 8.4  0.0 - 14.0 %   EOS% 5.3  0.0 - 7.0 %   BASO% 1.1  0.0 - 2.0 %  CEA     Status: None   Collection Time    12/12/13  3:49 PM      Result Value Range   CEA 2.9  0.0 - 5.0 ng/mL  LACTATE DEHYDROGENASE (CC13)     Status: Abnormal   Collection Time    12/12/13  3:49 PM      Result Value Range   LDH 293 (*) 125 - 245 U/L  COMPREHENSIVE METABOLIC PANEL (0000000)     Status: Abnormal   Collection Time    12/12/13  3:50 PM      Result Value Range   Sodium 143  136 - 145 mEq/L   Potassium 3.8  3.5 - 5.1 mEq/L   Chloride 107  98 - 109 mEq/L   CO2 27  22 - 29 mEq/L   Glucose 87  70 - 140 mg/dl   BUN 11.7  7.0 - 26.0 mg/dL   Creatinine 0.9  0.6 - 1.1 mg/dL   Total Bilirubin 1.06  0.20 - 1.20 mg/dL   Alkaline Phosphatase 99  40 - 150 U/L   AST 39 (*) 5 - 34 U/L   ALT 24  0 - 55 U/L   Total Protein 7.6  6.4 - 8.3 g/dL   Albumin 3.8  3.5 - 5.0 g/dL   Calcium 9.7  8.4 - 10.4  mg/dL   Anion Gap 8  3 - 11 mEq/L    Labs:  Lab Results  Component Value Date   WBC 4.2 12/12/2013   HGB 12.9 12/12/2013   HCT 37.9 12/12/2013   MCV 97.4 12/12/2013   PLT 192 12/12/2013   NEUTROABS 2.3 12/12/2013      Chemistry      Component Value Date/Time   NA 143 12/12/2013 1550   NA 142 11/30/2011 1547  K 3.8 12/12/2013 1550   K 3.6 11/30/2011 1547   CL 109* 11/28/2012 1555   CL 107 11/30/2011 1547   CO2 27 12/12/2013 1550   CO2 27 11/30/2011 1547   BUN 11.7 12/12/2013 1550   BUN 14 11/30/2011 1547   CREATININE 0.9 12/12/2013 1550   CREATININE 1.00 11/30/2011 1547      Component Value Date/Time   CALCIUM 9.7 12/12/2013 1550   CALCIUM 9.2 11/30/2011 1547   ALKPHOS 99 12/12/2013 1550   ALKPHOS 116 11/30/2011 1547   AST 39* 12/12/2013 1550   AST 28 11/30/2011 1547   ALT 24 12/12/2013 1550   ALT 15 11/30/2011 1547   BILITOT 1.06 12/12/2013 1550   BILITOT 0.6 11/30/2011 1547     Basic Metabolic Panel:  Recent Labs Lab 12/12/13 1550  NA 143  K 3.8  CO2 27  GLUCOSE 87  BUN 11.7  CREATININE 0.9  CALCIUM 9.7   GFR Estimated Creatinine Clearance: 81.3 ml/min (by C-G formula based on Cr of 0.9). Liver Function Tests:  Recent Labs Lab 12/12/13 1550  AST 39*  ALT 24  ALKPHOS 99  BILITOT 1.06  PROT 7.6  ALBUMIN 3.8   CBC:  Recent Labs Lab 12/12/13 1549  WBC 4.2  NEUTROABS 2.3  HGB 12.9  HCT 37.9  MCV 97.4  PLT 192   RADIOGRAPHIC STUDIES: Portable 1-view chest x-ray from 12/25/2010 showed  increased density in the left hemithorax suggesting worsening pleural effusion and/or airspace disease.   12/15/2012  DIGITAL DIAGNOSTIC LEFT MAMMOGRAM WITH CAD Comparison: Head 11/30/2012 Findings: ACR Breast Density Category 2: There is a scattered fibroglandular  pattern. Focal spot compression views of the upper central left breast show no evidence of mass or distortion. Findings on recent screening mammogram are most consistent with overlap of fibroglandular breast tissue. Mammographic  images were processed with CAD. IMPRESSION: No evidence of malignancy in the left breast. RECOMMENDATION: Bilateral screening mammogram in 1 year.  PROCEDURES: The patient's last colonoscopy was carried out by Dr. Laurence Spates on 06/25/2010.  ASSESSMENT: [name] 63 y.o. female with a history of Colon cancer   PLAN:   1. Colon Cancer. --Mrs. Mothershead is now over 10 years from the time of diagnosis in February 2004. There is no evidence for recurrent disease.  She is followed at [facility] every 6 months for her cirrhosis and associated problems. She has done well over the past year with her last colonoscopy on 06/25/2010.  Her next colonoscopy will be at a 5-year interval, somewhere around the summer of 2016.  She will follow-up in one year with plans to check CBC and chemistries at that time.   All questions were answered. The patient knows to call the clinic with any problems, questions or concerns. We can certainly see the patient much sooner if necessary.  I spent 10 minutes counseling the patient face to face. The total time spent in the appointment was 15 minutes.    Domenik Trice, MD 12/14/2013 3:41 PM

## 2013-12-14 NOTE — Telephone Encounter (Signed)
gv and printed appt sched and avs for pt for Jan 2016 °

## 2014-02-14 ENCOUNTER — Other Ambulatory Visit: Payer: Self-pay

## 2014-02-14 DIAGNOSIS — Z1231 Encounter for screening mammogram for malignant neoplasm of breast: Secondary | ICD-10-CM

## 2014-02-26 ENCOUNTER — Ambulatory Visit
Admission: RE | Admit: 2014-02-26 | Discharge: 2014-02-26 | Disposition: A | Discharge status: Home / Self Care | Payer: 59 | Source: Ambulatory Visit

## 2014-02-26 DIAGNOSIS — Z1231 Encounter for screening mammogram for malignant neoplasm of breast: Secondary | ICD-10-CM

## 2014-12-08 ENCOUNTER — Telehealth: Payer: Self-pay | Admitting: Hematology

## 2014-12-08 NOTE — Telephone Encounter (Signed)
, °

## 2014-12-17 ENCOUNTER — Other Ambulatory Visit: Payer: 59

## 2014-12-17 ENCOUNTER — Telehealth: Payer: Self-pay | Admitting: Hematology

## 2014-12-17 NOTE — Telephone Encounter (Signed)
Returned patients call, she is a Pharmacist, hospital and needed later appointments. Patient confirmed appointments, Mailed calendar.

## 2014-12-19 ENCOUNTER — Ambulatory Visit: Payer: 59

## 2015-01-03 ENCOUNTER — Ambulatory Visit: Payer: Self-pay | Admitting: Hematology

## 2015-01-03 ENCOUNTER — Other Ambulatory Visit: Payer: Self-pay

## 2015-01-04 ENCOUNTER — Other Ambulatory Visit: Payer: Self-pay | Admitting: *Deleted

## 2015-01-04 DIAGNOSIS — C189 Malignant neoplasm of colon, unspecified: Secondary | ICD-10-CM

## 2015-01-07 ENCOUNTER — Other Ambulatory Visit: Payer: Self-pay

## 2015-01-08 ENCOUNTER — Other Ambulatory Visit (HOSPITAL_BASED_OUTPATIENT_CLINIC_OR_DEPARTMENT_OTHER): Payer: 59

## 2015-01-08 DIAGNOSIS — Z85038 Personal history of other malignant neoplasm of large intestine: Secondary | ICD-10-CM

## 2015-01-08 DIAGNOSIS — C189 Malignant neoplasm of colon, unspecified: Secondary | ICD-10-CM

## 2015-01-08 LAB — COMPREHENSIVE METABOLIC PANEL (CC13)
ALT: 26 U/L (ref 0–55)
ANION GAP: 8 meq/L (ref 3–11)
AST: 43 U/L — ABNORMAL HIGH (ref 5–34)
Albumin: 3.5 g/dL (ref 3.5–5.0)
Alkaline Phosphatase: 98 U/L (ref 40–150)
BUN: 11.6 mg/dL (ref 7.0–26.0)
CO2: 25 meq/L (ref 22–29)
CREATININE: 0.8 mg/dL (ref 0.6–1.1)
Calcium: 8.8 mg/dL (ref 8.4–10.4)
Chloride: 109 mEq/L (ref 98–109)
EGFR: 79 mL/min/{1.73_m2} — ABNORMAL LOW (ref >90)
GLUCOSE: 81 mg/dL (ref 70–140)
Potassium: 4.1 mEq/L (ref 3.5–5.1)
Sodium: 143 mEq/L (ref 136–145)
Total Bilirubin: 0.76 mg/dL (ref 0.20–1.20)
Total Protein: 7 g/dL (ref 6.4–8.3)

## 2015-01-08 LAB — CBC WITH DIFFERENTIAL/PLATELET
BASO%: 0.9 % (ref 0.0–2.0)
Basophils Absolute: 0 10*3/uL (ref 0.0–0.1)
EOS%: 6.6 % (ref 0.0–7.0)
Eosinophils Absolute: 0.2 10*3/uL (ref 0.0–0.5)
HCT: 39.7 % (ref 34.8–46.6)
HGB: 12.7 g/dL (ref 11.6–15.9)
LYMPH#: 1.2 10*3/uL (ref 0.9–3.3)
LYMPH%: 32.2 % (ref 14.0–49.7)
MCH: 31.3 pg (ref 25.1–34.0)
MCHC: 32 g/dL (ref 31.5–36.0)
MCV: 97.8 fL (ref 79.5–101.0)
MONO#: 0.3 10*3/uL (ref 0.1–0.9)
MONO%: 9.1 % (ref 0.0–14.0)
NEUT#: 1.9 10*3/uL (ref 1.5–6.5)
NEUT%: 51.2 % (ref 38.4–76.8)
Platelets: 182 10*3/uL (ref 145–400)
RBC: 4.06 10*6/uL (ref 3.70–5.45)
RDW: 13.4 % (ref 11.2–14.5)
WBC: 3.7 10*3/uL — AB (ref 3.9–10.3)

## 2015-01-09 ENCOUNTER — Ambulatory Visit (HOSPITAL_BASED_OUTPATIENT_CLINIC_OR_DEPARTMENT_OTHER): Payer: 59 | Admitting: Hematology

## 2015-01-09 ENCOUNTER — Other Ambulatory Visit: Payer: Self-pay | Admitting: Hematology

## 2015-01-09 ENCOUNTER — Telehealth: Payer: Self-pay | Admitting: Hematology

## 2015-01-09 VITALS — BP 143/62 | HR 79 | Temp 98.2°F | Resp 19 | Ht 66.0 in | Wt 246.0 lb

## 2015-01-09 DIAGNOSIS — Z85038 Personal history of other malignant neoplasm of large intestine: Secondary | ICD-10-CM

## 2015-01-09 DIAGNOSIS — C189 Malignant neoplasm of colon, unspecified: Secondary | ICD-10-CM

## 2015-01-09 DIAGNOSIS — K7460 Unspecified cirrhosis of liver: Secondary | ICD-10-CM

## 2015-01-09 DIAGNOSIS — Z1231 Encounter for screening mammogram for malignant neoplasm of breast: Secondary | ICD-10-CM

## 2015-01-09 NOTE — Progress Notes (Signed)
[place] OFFICE PROGRESS NOTE  [name], MD [address] [place] [address]  DIAGNOSIS: Colon cancer - Plan: CBC with Differential, Comprehensive metabolic panel (Cmet) - [facility], Lipid panel, CANCELED: MM Digital Diagnostic Bilat, CANCELED: MM DIGITAL SCREENING BILATERAL  Chief Complaint  Patient presents with  . Follow-up    colon CA    CURRENT THERAPY:  Observation.  INTERVAL HISTORY: [name] 64 y.o. female with a history of stage IIIB (T3 N1 M0) moderately differentiated adenocarcinoma of the cecum associated with matted lymph nodes and a mass that measured 4 cm at the time of surgery which was carried out on 01/08/2003, she then received 12 cycles of adjuvant FOLFOX from 02/05/2003 through 09/24/2003.   INTERIM HISTORY: [name] returns for follow up, accompanied by her husband. She has been doing well, she denies any abdominal discomfort, change of her bowel habits, or any other complaints. No ED visit or hospitalization in the past year.  MEDICAL HISTORY: Past Medical History  Diagnosis Date  . Malignant neoplasm of cecum   . Iron deficiency anemia   . Splenomegaly   . Portal hypertension   . Elevated liver function tests   . Cancer     cecum    INTERIM HISTORY: has Colon cancer on her problem list.    ALLERGIES:  is allergic to contrast media.  MEDICATIONS: has a current medication list which includes the following prescription(s): glucosamine-chondroitin, multivitamin, and vitamin c.  SURGICAL HISTORY:  Past Surgical History  Procedure Laterality Date  . Hemicolectomy  01/08/03   PROBLEM LIST:  1. Moderately differentiated adenocarcinoma of the cecum associated with matted lymph nodes and a mass that measured 4 cm, stage T3 N1 M0, stage IIIB status post 12 cycles of adjuvant FOLFOX from 02/05/2003 through 09/24/2003. The patient remains free of disease.  2. 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 Liver  biopsies were carried out in May 2010 showing hepatic sinusoidal obstruction. An MRI of the abdomen on 08/09/2009 showed a nodular liver without masses. There was evidence of portal hypertension. There was no ascites. Spleen measured 14.6 cm.  3. Recurrent variceal bleeding status post TIPS (transjugular  intrahepatic portacaval shunt) procedure on 05/05/2011 at [facility].  4. Mild pancytopenia secondary to hypersplenism.  5. Rosacea.  REVIEW OF SYSTEMS:   Constitutional: Denies fevers, chills or abnormal weight loss Eyes: Denies blurriness of vision Ears, nose, mouth, throat, and face: Denies mucositis or sore throat Respiratory: Denies cough, dyspnea or wheezes Cardiovascular: Denies palpitation, chest discomfort or lower extremity swelling Gastrointestinal:  Denies nausea, heartburn or change in bowel habits Skin: Denies abnormal skin rashes Lymphatics: Denies new lymphadenopathy or easy bruising Neurological:Denies numbness, tingling or new weaknesses Behavioral/Psych: Mood is stable, no new changes  All other systems were reviewed with the patient and are negative.  PHYSICAL EXAMINATION: ECOG PERFORMANCE STATUS: 0 - Asymptomatic  Blood pressure 143/62, pulse 79, temperature 98.2 F (36.8 C), temperature source Oral, resp. rate 19, height 5\' 6"  (1.676 m), weight 246 lb (111.585 kg), SpO2 100 %.  GENERAL:alert, no distress and comfortable; Obese.  SKIN: skin color, texture, turgor are normal, no rashes or significant lesions EYES: normal, Conjunctiva are pink and non-injected, sclera clear OROPHARYNX:no exudate, no erythema and lips, buccal mucosa, and tongue normal  NECK: supple, thyroid normal size, non-tender, without nodularity LYMPH:  no palpable lymphadenopathy in the cervical, axillary or supraclavicular LUNGS: clear to auscultation with normal breathing effort HEART: regular rate & rhythm and  no murmurs and no lower extremity edema ABDOMEN:abdomen soft, non-tender and normal  bowel sounds Musculoskeletal:no cyanosis of digits and no clubbing  NEURO: alert & oriented x 3 with fluent speech, no focal motor/sensory deficits   LABORATORY DATA: CBC Latest Ref Rng 01/08/2015 12/12/2013 11/28/2012  WBC 3.9 - 10.3 10e3/uL 3.7(L) 4.2 3.6(L)  Hemoglobin 11.6 - 15.9 g/dL 12.7 12.9 12.9  Hematocrit 34.8 - 46.6 % 39.7 37.9 37.3  Platelets 145 - 400 10e3/uL 182 192 186   CMP Latest Ref Rng 01/08/2015 12/12/2013 11/28/2012  Glucose 70 - 140 mg/dl 81 87 72  BUN 7.0 - 26.0 mg/dL 11.6 11.7 14.0  Creatinine 0.6 - 1.1 mg/dL 0.8 0.9 1.0  Sodium 136 - 145 mEq/L 143 143 143  Potassium 3.5 - 5.1 mEq/L 4.1 3.8 3.9  Chloride 98 - 107 mEq/L - - 109(H)  CO2 22 - 29 mEq/L 25 27 26   Calcium 8.4 - 10.4 mg/dL 8.8 9.7 9.4  Total Protein 6.4 - 8.3 g/dL 7.0 7.6 7.5  Total Bilirubin 0.20 - 1.20 mg/dL 0.76 1.06 0.95  Alkaline Phos 40 - 150 U/L 98 99 110  AST 5 - 34 U/L 43(H) 39(H) 46(H)  ALT 0 - 55 U/L 26 24 31     RADIOGRAPHIC STUDIES: Portable 1-view chest x-ray from 12/25/2010 showed  increased density in the left hemithorax suggesting worsening pleural effusion and/or airspace disease.   12/15/2012  DIGITAL DIAGNOSTIC LEFT MAMMOGRAM WITH CAD Comparison: Head 11/30/2012 Findings: ACR Breast Density Category 2: There is a scattered fibroglandular  pattern. Focal spot compression views of the upper central left breast show no evidence of mass or distortion. Findings on recent screening mammogram are most consistent with overlap of fibroglandular breast tissue. Mammographic images were processed with CAD. IMPRESSION: No evidence of malignancy in the left breast. RECOMMENDATION: Bilateral screening mammogram in 1 year.  PROCEDURES: The patient's last colonoscopy was carried out by Dr. Laurence Spates on 06/25/2010.  ASSESSMENT: [name] 64 y.o. female with a history of Colon cancer - Plan: CBC with Differential, Comprehensive metabolic panel (Cmet) - [facility], Lipid panel, CANCELED: MM Digital  Diagnostic Bilat, CANCELED: MM DIGITAL SCREENING BILATERAL   PLAN:   1. Colon Cancer. --[name] is now over 11 years from the time of diagnosis in February 2004. There is no evidence for recurrent disease.  -I reviewed her lab CBC and CMP, which are unremarkable. There is no role for checking CEA and routine scan at this point. -She does not need routine follow-up with [place] anymore. However she does not have a primary care physician. We will give her her low bowel primary care clinic scheduling phone number, and she will call to schedule an appointment -I'll schedule her one more annual follow-up appointment, just in case she is not able to establish her care with her primary care physician  2. Liver cirrhosis  -She is followed at [facility] every 6 months for her cirrhosis and associated problems. She has done well   All questions were answered. The patient knows to call the clinic with any problems, questions or concerns. We can certainly see the patient much sooner if necessary.  I spent 10 minutes counseling the patient face to face. The total time spent in the appointment was 15 minutes.    Truitt Merle, MD 01/09/2015 9:58 AM

## 2015-01-09 NOTE — Telephone Encounter (Signed)
Gave avs & calendar for February 2017. °

## 2015-01-13 ENCOUNTER — Encounter: Payer: Self-pay | Admitting: Hematology

## 2015-02-28 ENCOUNTER — Ambulatory Visit
Admission: RE | Admit: 2015-02-28 | Discharge: 2015-02-28 | Disposition: A | Discharge status: Home / Self Care | Payer: 59 | Source: Ambulatory Visit | Attending: Hematology | Admitting: Hematology

## 2015-02-28 DIAGNOSIS — Z1231 Encounter for screening mammogram for malignant neoplasm of breast: Secondary | ICD-10-CM

## 2015-12-19 ENCOUNTER — Telehealth: Payer: Self-pay | Admitting: Hematology

## 2015-12-19 NOTE — Telephone Encounter (Signed)
Unable to leave message for patient to inform them of a change to their appointment date/time due to provider being on call. Letter will be sent thru mail.

## 2016-01-07 ENCOUNTER — Other Ambulatory Visit: Payer: 59

## 2016-01-07 ENCOUNTER — Ambulatory Visit: Payer: 59 | Admitting: Hematology

## 2016-01-09 ENCOUNTER — Ambulatory Visit: Payer: 59 | Admitting: Hematology

## 2016-01-10 ENCOUNTER — Encounter: Payer: 59 | Admitting: Hematology

## 2016-01-10 ENCOUNTER — Other Ambulatory Visit: Payer: Self-pay | Admitting: *Deleted

## 2016-01-10 NOTE — Progress Notes (Signed)
This encounter was created in error - please disregard.

## 2016-01-14 ENCOUNTER — Telehealth: Payer: Self-pay | Admitting: Hematology

## 2016-01-14 NOTE — Telephone Encounter (Signed)
Per 2/17 pof r/s lab/fu. Not able to reach patient by phone or leave message. March schedule mailed.

## 2016-02-10 ENCOUNTER — Other Ambulatory Visit: Payer: 59

## 2016-02-10 ENCOUNTER — Encounter: Payer: Self-pay | Admitting: Hematology

## 2016-02-10 ENCOUNTER — Encounter: Payer: 59 | Admitting: Hematology

## 2016-02-10 NOTE — Progress Notes (Signed)
No show today. Will call if see if she still wants to return    This encounter was created in error - please disregard.

## 2016-02-11 ENCOUNTER — Telehealth: Payer: Self-pay | Admitting: Hematology

## 2016-02-11 NOTE — Telephone Encounter (Signed)
cld pt to r/s appt for missed appt-adv to call office if interested in r/s appt

## 2017-05-10 ENCOUNTER — Other Ambulatory Visit: Payer: Self-pay | Admitting: Family Medicine

## 2017-05-10 DIAGNOSIS — Z1231 Encounter for screening mammogram for malignant neoplasm of breast: Secondary | ICD-10-CM

## 2017-06-01 ENCOUNTER — Ambulatory Visit
Admission: RE | Admit: 2017-06-01 | Discharge: 2017-06-01 | Disposition: A | Discharge status: Home / Self Care | Payer: 59 | Source: Ambulatory Visit | Attending: Family Medicine | Admitting: Family Medicine

## 2017-06-01 ENCOUNTER — Encounter: Payer: Self-pay | Admitting: *Deleted

## 2017-06-01 DIAGNOSIS — Z1231 Encounter for screening mammogram for malignant neoplasm of breast: Secondary | ICD-10-CM | POA: Diagnosis not present

## 2018-06-02 ENCOUNTER — Other Ambulatory Visit: Payer: Self-pay | Admitting: Family Medicine

## 2018-06-02 DIAGNOSIS — Z1231 Encounter for screening mammogram for malignant neoplasm of breast: Secondary | ICD-10-CM

## 2018-06-06 ENCOUNTER — Telehealth: Payer: Self-pay | Admitting: Family Medicine

## 2018-06-06 NOTE — Telephone Encounter (Signed)
See below crm Ok to schedule?

## 2018-06-06 NOTE — Telephone Encounter (Signed)
Copied from [place] [id number]. Topic: Quick Communication - See Telephone Encounter >> Jun 06, 2018 10:21 AM Bea Graff, NT wrote: CRM for notification. See Telephone encounter for: 06/06/18. Pt calling to set up her Welcome to Medicare visit. Pt selected Dr. Damita Dunnings as her PCP when she turned 74, but never established with him or with the office. A mammogram results was sent to Dr. Damita Dunnings on 06/01/17 and Dr. Damita Dunnings replied. Can pt establish with Dr. Damita Dunnings or should she establish with one of the NPs? Please advise.

## 2018-06-07 NOTE — Telephone Encounter (Signed)
I don't know why the mammogram was sent to me initially.  If anyone else is taking new patients and has a slot sooner, then it may make sense to establish with them.  Please talk to me about this.  Thanks.

## 2018-06-14 NOTE — Telephone Encounter (Signed)
Pt schedule new patient appointment with debbie 9/18

## 2018-07-01 ENCOUNTER — Encounter: Payer: Self-pay | Admitting: Radiology

## 2018-07-01 ENCOUNTER — Ambulatory Visit
Admission: RE | Admit: 2018-07-01 | Discharge: 2018-07-01 | Disposition: A | Discharge status: Home / Self Care | Payer: Medicare Other | Source: Ambulatory Visit | Attending: Family Medicine | Admitting: Family Medicine

## 2018-07-01 DIAGNOSIS — Z1231 Encounter for screening mammogram for malignant neoplasm of breast: Secondary | ICD-10-CM

## 2018-08-10 ENCOUNTER — Ambulatory Visit: Payer: 59 | Admitting: Family Medicine

## 2018-09-14 ENCOUNTER — Encounter: Payer: Self-pay | Admitting: Family Medicine

## 2018-09-14 ENCOUNTER — Encounter (INDEPENDENT_AMBULATORY_CARE_PROVIDER_SITE_OTHER): Payer: Self-pay

## 2018-09-14 ENCOUNTER — Ambulatory Visit (INDEPENDENT_AMBULATORY_CARE_PROVIDER_SITE_OTHER): Payer: Medicare Other | Admitting: Family Medicine

## 2018-09-14 VITALS — BP 130/78 | HR 68 | Temp 97.6°F | Ht 63.5 in | Wt 262.0 lb

## 2018-09-14 DIAGNOSIS — M7989 Other specified soft tissue disorders: Secondary | ICD-10-CM | POA: Diagnosis not present

## 2018-09-14 DIAGNOSIS — K766 Portal hypertension: Secondary | ICD-10-CM | POA: Diagnosis not present

## 2018-09-14 DIAGNOSIS — Z2821 Immunization not carried out because of patient refusal: Secondary | ICD-10-CM

## 2018-09-14 DIAGNOSIS — C180 Malignant neoplasm of cecum: Secondary | ICD-10-CM | POA: Diagnosis not present

## 2018-09-14 DIAGNOSIS — Z7689 Persons encountering health services in other specified circumstances: Secondary | ICD-10-CM

## 2018-09-14 DIAGNOSIS — E039 Hypothyroidism, unspecified: Secondary | ICD-10-CM

## 2018-09-14 NOTE — Progress Notes (Signed)
Subjective:    Patient ID: [name], female    DOB: 06-30-1951, 67 y.o.   MRN: [id number]  HPI This is a 67 yo female who presents today to establish care. She was accompanied by her husband. 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 Enjoys reading.   Last CPE- many years Mammo- 07/01/18 Pap- Dr. Marvel Plan did last one, said she didn't need another one Colonoscopy- 5-6 years ago Tdap- unsure Flu-declines Eye- annual Dental-overdue,  looking for someone on her insurance Exercise- not regular  Colon cancer- has history of cecum cancer, oxaliplatin induced non-cirrhotic portal hypertension, s/p TIPS for variceal bleed. She was previously followed by [facility]) as well as [facility] clinic, but has not had any follow up in about 4 years. She was uninsured for a number of years after her husband lost his job.   Obesity- has gained weight in recent years. Does not get regular exercise, husband admits to "babying" her throughout illness and beyond.   ROS- no headaches, no visual changes, DOE- climbing stairs, no chest pain, some lower extremity swelling as day progresses.  Past Medical History:  Diagnosis Date  . Cancer ([place])    cecum  . Elevated liver function tests   . Iron deficiency anemia   . Malignant neoplasm of cecum ([place])   . Portal hypertension ([place])   . Splenomegaly    Past Surgical History:  Procedure Laterality Date  . HEMICOLECTOMY  01/08/03   Family History  Problem Relation Age of Onset  . Arthritis Mother   . Hearing loss Mother   . Heart disease Mother   . Hypertension Mother   . Arthritis Father   . Diabetes Father   . Heart disease Father    Social History   Tobacco Use  . Smoking status: Never Smoker  . Smokeless tobacco: Never Used  Substance Use Topics  . Alcohol use: Never    Frequency: Never  . Drug use: Never      Review of Systems Per HPI    Objective:   Physical Exam  Constitutional: She is oriented to  person, place, and time. She appears well-developed and well-nourished. No distress.  HENT:  Head: Normocephalic and atraumatic.  Eyes: Conjunctivae are normal.  Cardiovascular: Normal rate, regular rhythm and normal heart sounds.  Pulmonary/Chest: Effort normal and breath sounds normal.  Musculoskeletal: She exhibits edema (1+ LE).  Neurological: She is alert and oriented to person, place, and time.  Skin: She is not diaphoretic. There is pallor.  Vitals reviewed.     BP 130/78 (BP Location: Left Arm, Patient Position: Sitting, Cuff Size: Large)   Pulse 68   Temp 97.6 F (36.4 C) (Oral)   Ht 5' 3.5" (1.613 m)   Wt 262 lb (118.8 kg)   SpO2 98%   BMI 45.68 kg/m  Wt Readings from Last 3 Encounters:  09/14/18 262 lb (118.8 kg)  01/09/15 246 lb (111.6 kg)  12/14/13 242 lb (109.8 kg)       Assessment & Plan:  1. Encounter to establish care - Discussed and encouraged healthy lifestyle choices- adequate sleep, regular exercise, stress management and healthy food choices.  - discussed prioritizing catching up on care, will get her to GI, check labs, follow up for CPE in 4 months  2. Portal hypertension ([place]) - Ambulatory referral to Gastroenterology  3. Cecum cancer ([place]) - per patient, she is overdue for colonoscopy, I have requested records from [facility] - Ambulatory referral  to Gastroenterology  4. Morbid obesity ([place]) - encouraged increased activity, healthy food choices, will address more in depth at follow up - CBC with Differential - Comprehensive metabolic panel - TSH - Vitamin D, 25-hydroxy - Lipid Panel  5. Swelling of lower extremity - CBC with Differential - Comprehensive metabolic panel - TSH   Clarene Reamer, FNP-BC  [facility] [place] [facility]  09/15/2018 8:45 AM

## 2018-09-14 NOTE — Patient Instructions (Addendum)
Good to meet you today  Please stop at lab for blood work, see referrals coordinator to get GI appointment   Follow up in 4 months for CPE  Work on moving more- try to walk 5 times a week- try a good audio book or podcast

## 2018-09-15 ENCOUNTER — Encounter: Payer: Self-pay | Admitting: Family Medicine

## 2018-09-15 LAB — CBC WITH DIFFERENTIAL/PLATELET
Basophils Absolute: 0 10*3/uL (ref 0.0–0.1)
Basophils Relative: 0.8 % (ref 0.0–3.0)
Eosinophils Absolute: 0.2 10*3/uL (ref 0.0–0.7)
Eosinophils Relative: 4.7 % (ref 0.0–5.0)
HCT: 38.5 % (ref 36.0–46.0)
Hemoglobin: 13.1 g/dL (ref 12.0–15.0)
LYMPHS ABS: 1.3 10*3/uL (ref 0.7–4.0)
Lymphocytes Relative: 27.1 % (ref 12.0–46.0)
MCHC: 34.1 g/dL (ref 30.0–36.0)
MCV: 99.8 fl (ref 78.0–100.0)
MONOS PCT: 10.2 % (ref 3.0–12.0)
Monocytes Absolute: 0.5 10*3/uL (ref 0.1–1.0)
NEUTROS PCT: 57.2 % (ref 43.0–77.0)
Neutro Abs: 2.7 10*3/uL (ref 1.4–7.7)
Platelets: 196 10*3/uL (ref 150.0–400.0)
RBC: 3.86 Mil/uL — AB (ref 3.87–5.11)
RDW: 13.5 % (ref 11.5–15.5)
WBC: 4.8 10*3/uL (ref 4.0–10.5)

## 2018-09-15 LAB — COMPREHENSIVE METABOLIC PANEL
ALBUMIN: 4 g/dL (ref 3.5–5.2)
ALT: 28 U/L (ref 0–35)
AST: 57 U/L — AB (ref 0–37)
Alkaline Phosphatase: 85 U/L (ref 39–117)
BILIRUBIN TOTAL: 0.9 mg/dL (ref 0.2–1.2)
BUN: 15 mg/dL (ref 6–23)
CALCIUM: 9.8 mg/dL (ref 8.4–10.5)
CO2: 28 mEq/L (ref 19–32)
CREATININE: 1.11 mg/dL (ref 0.40–1.20)
Chloride: 104 mEq/L (ref 96–112)
GFR: 52.06 mL/min — ABNORMAL LOW (ref >60.00)
Glucose, Bld: 79 mg/dL (ref 70–99)
Potassium: 4.5 mEq/L (ref 3.5–5.1)
SODIUM: 139 meq/L (ref 135–145)
Total Protein: 7.7 g/dL (ref 6.0–8.3)

## 2018-09-15 LAB — LIPID PANEL
CHOL/HDL RATIO: 2
Cholesterol: 159 mg/dL (ref 0–200)
HDL: 73.6 mg/dL (ref >39.00)
LDL Cholesterol: 77 mg/dL (ref 0–99)
NONHDL: 85.87
Triglycerides: 45 mg/dL (ref 0.0–149.0)
VLDL: 9 mg/dL (ref 0.0–40.0)

## 2018-09-15 LAB — TSH

## 2018-09-15 LAB — VITAMIN D 25 HYDROXY (VIT D DEFICIENCY, FRACTURES): VITD: 53.64 ng/mL (ref 30.00–100.00)

## 2018-09-16 ENCOUNTER — Encounter: Payer: Self-pay | Admitting: Gastroenterology

## 2018-09-16 MED ORDER — LEVOTHYROXINE SODIUM 50 MCG PO TABS: 50.0000 ug | ORAL_TABLET | Freq: Every day | ORAL | 1 refills | Status: DC

## 2018-09-16 NOTE — Addendum Note (Signed)
Addended by: Clarene Reamer B on: 09/16/2018 05:35 AM   Modules accepted: Orders

## 2018-11-11 ENCOUNTER — Ambulatory Visit: Payer: Medicare Other | Admitting: Gastroenterology

## 2018-11-11 ENCOUNTER — Encounter: Payer: Self-pay | Admitting: Gastroenterology

## 2018-11-11 VITALS — BP 126/80 | HR 80 | Ht 63.39 in | Wt 255.0 lb

## 2018-11-11 DIAGNOSIS — K766 Portal hypertension: Secondary | ICD-10-CM

## 2018-11-11 DIAGNOSIS — K228 Other specified diseases of esophagus: Secondary | ICD-10-CM | POA: Diagnosis not present

## 2018-11-11 DIAGNOSIS — C180 Malignant neoplasm of cecum: Secondary | ICD-10-CM

## 2018-11-11 DIAGNOSIS — K2289 Other specified disease of esophagus: Secondary | ICD-10-CM

## 2018-11-11 NOTE — Progress Notes (Signed)
Referring Provider: Elby Beck, FNP Primary Care Physician:  Elby Beck, FNP   Reason for Consultation:  History of cecal cancer   IMPRESSION:  History of cecal adenocarcinoma    - obtain records from [facility] to confirm surveillance Oxiplatin-induced non-cirrhotic portal hypertension Variceal bleed 2012 s/p TIPS  PLAN: Obtain all colonoscopy records from [facility] and [facility] Surveillance colonoscopy due 2020 Doppler abdominal ultrasound to follow-up TIPS now and in 6 months Your children should start screening colonoscopy at age 62 Return to this clinic in 6 months after the TIPS or earlier as needed   HPI: 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 Her husband of 25 years accompanies her to this appointment.  The history is obtained to the patient and review of her electronic health record. I have also reviewed her care everywhere chart.   Colon cancer- has history of cecum cancer diagnosed in 2004. No symptoms at this time.   Pathology results show:  COLON, SEGMENTAL RESECTION, RIGHT: MODERATELY DIFFERENTIATED COLONIC-TYPE ADENOCARCINOMA ARISING IN REGION OF ILEOCECAL VALVE WITH EXTENSION THROUGH FULL THICKNESS OF MUSCLE WALL. LYMPH NODES: ONE OF SEVENTEEN LYMPH NODES WITH METASTATIC CARCINOMA, SEE COMMENT. Previously diagnosed as: Sixteen benign lymph nodes, no tumor present.  Treated with oxaliplatin that was complicated by oxyplatin-induced non-cirrhotic portal hypertension requiring a TIPS for variceal bleeding. She was previously followed by [facility]) as well as [facility] clinic. Her liver disease has been stable since that time.  Last seen by Dr. Monica Martinez in 2015. She was uninsured for a number of years after her husband lost his job. She now has Medicare and is trying to get reestablished. Last TIPS doppler ultrasound 03/21/14.    Last colonoscopy 2015.   Paternal grandmother with colon cancer over >70.  No other known family history of colon cancer or polyps.     Past Medical History:  Diagnosis Date  . Cancer ([place])    cecum  . Elevated liver function tests   . Iron deficiency anemia   . Malignant neoplasm of cecum ([place])   . Portal hypertension ([place])   . Splenomegaly     Past Surgical History:  Procedure Laterality Date  . HEMICOLECTOMY  01/08/03    Current Outpatient Medications  Medication Sig Dispense Refill  . ELDERBERRY PO Take by mouth. Take 1 tablespoon by mouth daily    . glucosamine-chondroitin 500-400 MG tablet Take 1 tablet by mouth 2 (two) times daily.    Marland Kitchen levothyroxine (SYNTHROID, LEVOTHROID) 50 MCG tablet Take 1 tablet (50 mcg total) by mouth daily. 90 tablet 1  . Multiple Vitamin (MULTIVITAMIN) capsule Take 1 capsule by mouth daily.    Marland Kitchen omega-3 acid ethyl esters (LOVAZA) 1 g capsule Take 1,000 mg by mouth daily.    . TURMERIC PO Take 1 tablet by mouth daily.    Marland Kitchen VITAMIN A PO Take 3,000 mcg by mouth daily.    . vitamin C (ASCORBIC ACID) 500 MG tablet Take 1,000 mg by mouth daily.     Marland Kitchen ZINC OXIDE PO Take by mouth.     No current facility-administered medications for this visit.     Allergies as of 11/11/2018 - Review Complete 11/11/2018  Allergen Reaction Noted  . Contrast media [iodinated diagnostic agents] Hives 11/30/2011    Family History  Problem Relation Age of Onset  . Arthritis Mother   .  Hearing loss Mother   . Heart disease Mother   . Hypertension Mother   . Arthritis Father   . Diabetes Father   . Heart disease Father     Social History   Socioeconomic History  . Marital status: Married    Spouse name: Not on file  . Number of children: 2  . Years of education: Not on file  . Highest education level: Not on file  Occupational History  . Occupation: Associate Professor  . Financial resource strain: Not on file  . Food insecurity:    Worry: Not  on file    Inability: Not on file  . Transportation needs:    Medical: Not on file    Non-medical: Not on file  Tobacco Use  . Smoking status: Never Smoker  . Smokeless tobacco: Never Used  Substance and Sexual Activity  . Alcohol use: Never    Frequency: Never  . Drug use: Never  . Sexual activity: Not Currently    Partners: Male  Lifestyle  . Physical activity:    Days per week: Not on file    Minutes per session: Not on file  . Stress: Not on file  Relationships  . Social connections:    Talks on phone: Not on file    Gets together: Not on file    Attends religious service: Not on file    Active member of club or organization: Not on file    Attends meetings of clubs or organizations: Not on file    Relationship status: Not on file  . Intimate partner violence:    Fear of current or ex partner: Not on file    Emotionally abused: Not on file    Physically abused: Not on file    Forced sexual activity: Not on file  Other Topics Concern  . Not on file  Social History Narrative  . Not on file    Review of Systems: 12 system ROS is negative except as noted above.  Filed Weights   11/11/18 1351  Weight: 255 lb (115.7 kg)    Physical Exam: General:   Alert, in NAD. No scleral icterus. No bilateral temporal wasting.  Heart:  Regular rate and rhythm; no murmurs Pulm: Clear anteriorly; no wheezing Abdomen:  Soft. Central obesity. Nontender. Nondistended. Normal bowel sounds. No rebound or guarding. No fluid wave.  LAD: No inguinal or umbilical LAD Extremities:  Without edema. Neurologic:  Alert and  oriented x4;  grossly normal neurologically; no asterixis or clonus. Skin: No jaundice. Mild palmar erythema. Spider angioma on the chest wall.  Psych:  Alert and cooperative. Normal mood and affect.   Lyfe Reihl L. Tarri Glenn, MD, MPH [facility] 11/11/2018, 2:30 PM

## 2018-11-11 NOTE — Patient Instructions (Signed)
You have been scheduled for an abdominal doppler at [facility] (1st floor of hospital) on 11-18-18 at 11:00 am. Please arrive 15 minutes prior to your appointment for registration. Make certain not to have anything to eat or drink 6 hours prior to your appointment. Should you need to reschedule your appointment, please contact radiology at [phone]. This test typically takes about 30 minutes to perform.

## 2018-11-18 ENCOUNTER — Telehealth: Payer: Self-pay | Admitting: Gastroenterology

## 2018-11-18 ENCOUNTER — Ambulatory Visit (HOSPITAL_COMMUNITY)
Admission: RE | Admit: 2018-11-18 | Discharge: 2018-11-18 | Disposition: A | Discharge status: Home / Self Care | Payer: Medicare Other | Source: Ambulatory Visit | Attending: Gastroenterology | Admitting: Gastroenterology

## 2018-11-18 DIAGNOSIS — K2289 Other specified disease of esophagus: Secondary | ICD-10-CM

## 2018-11-18 DIAGNOSIS — C180 Malignant neoplasm of cecum: Secondary | ICD-10-CM | POA: Diagnosis not present

## 2018-11-18 DIAGNOSIS — K766 Portal hypertension: Secondary | ICD-10-CM | POA: Insufficient documentation

## 2018-11-18 DIAGNOSIS — K228 Other specified diseases of esophagus: Secondary | ICD-10-CM

## 2018-11-18 DIAGNOSIS — Z4541 Encounter for adjustment and management of cerebrospinal fluid drainage device: Secondary | ICD-10-CM | POA: Diagnosis not present

## 2018-11-18 NOTE — Telephone Encounter (Signed)
See result note.  

## 2018-11-28 ENCOUNTER — Encounter: Payer: Self-pay | Admitting: Gastroenterology

## 2019-01-18 ENCOUNTER — Encounter: Payer: Self-pay | Admitting: Family Medicine

## 2019-01-18 ENCOUNTER — Ambulatory Visit (INDEPENDENT_AMBULATORY_CARE_PROVIDER_SITE_OTHER): Payer: Medicare Other | Admitting: Family Medicine

## 2019-01-18 ENCOUNTER — Other Ambulatory Visit: Payer: Self-pay

## 2019-01-18 VITALS — BP 122/78 | HR 77 | Temp 97.6°F | Resp 16 | Ht 63.9 in | Wt 256.8 lb

## 2019-01-18 DIAGNOSIS — J3489 Other specified disorders of nose and nasal sinuses: Secondary | ICD-10-CM

## 2019-01-18 DIAGNOSIS — R944 Abnormal results of kidney function studies: Secondary | ICD-10-CM | POA: Diagnosis not present

## 2019-01-18 DIAGNOSIS — E039 Hypothyroidism, unspecified: Secondary | ICD-10-CM

## 2019-01-18 DIAGNOSIS — Z0000 Encounter for general adult medical examination without abnormal findings: Secondary | ICD-10-CM | POA: Diagnosis not present

## 2019-01-18 DIAGNOSIS — E2839 Other primary ovarian failure: Secondary | ICD-10-CM

## 2019-01-18 DIAGNOSIS — Z13820 Encounter for screening for osteoporosis: Secondary | ICD-10-CM

## 2019-01-18 NOTE — Progress Notes (Signed)
Subjective:   [name] is a 68 y.o. female who presents for Medicare Annual (Subsequent) preventive examination.  Review of Systems:  Negative- headache, SOB, chest pain, abdominal pain, diarrhea, constipation, visual change Positive- skin lesion on nose, knee pain (improved with essential oils), leg swelling at end of day       Objective:    Physical Exam  Constitutional: She is oriented to person, place, and time. She appears well-developed and well-nourished. No distress.  HENT:  Head: Normocephalic and atraumatic.  Right Ear: External ear normal.  Left Ear: External ear normal.  Nose: Nose normal.  Mouth/Throat: Oropharynx is clear and moist. No oropharyngeal exudate.  Eyes: Conjunctivae are normal. Pupils are equal, round, and reactive to light.  Neck: Normal range of motion. Neck supple. No JVD present. No thyromegaly present.  Cardiovascular: Normal rate, regular rhythm, normal heart sounds and intact distal pulses.   Pulmonary/Chest: Effort normal and breath sounds normal. Right breast exhibits no inverted nipple, no mass, no nipple discharge, no skin change and no tenderness. Left breast exhibits no inverted nipple, no mass, no nipple discharge, no skin change and no tenderness. Breasts are symmetrical.  Abdominal: Soft. Bowel sounds are normal. She exhibits no distension and no mass. There is no tenderness. There is no rebound and no guarding.  Musculoskeletal: Normal range of motion. She exhibits trace pretibial edema.  Lymphadenopathy:    She has no cervical adenopathy.  Neurological: She is alert and oriented to person, place, and time.  Skin: Skin is warm and dry. She is not diaphoretic. Nose with several areas flaking, scabbing, erythema. Bilateral lateral feet with skin thickening.  Psychiatric: She has a normal mood and affect. Her behavior is normal. Judgment and thought content normal.  Vitals reviewed.   Vitals: BP 122/78 (BP Location: Left Arm, Patient  Position: Sitting, Cuff Size: Large)   Pulse 77   Temp 97.6 F (36.4 C) (Oral)   Resp 16   Ht 5' 3.9" (1.623 m)   Wt 256 lb 12.8 oz (116.5 kg)   SpO2 96%   BMI 44.22 kg/m   Body mass index is 44.22 kg/m. Wt Readings from Last 3 Encounters:  01/18/19 256 lb 12.8 oz (116.5 kg)  11/11/18 255 lb (115.7 kg)  09/14/18 262 lb (118.8 kg)    Advanced Directives 01/09/2015  Does Patient Have a Medical Advance Directive? No  Would patient like information on creating a medical advance directive? Yes - Scientist, clinical (histocompatibility and immunogenetics) given    Tobacco Social History   Tobacco Use  Smoking Status Never Smoker  Smokeless Tobacco Never Used     Counseling given: Not Answered   Clinical Intake:    Past Medical History:  Diagnosis Date  . Cancer ([place])    cecum  . Elevated liver function tests   . Iron deficiency anemia   . Malignant neoplasm of cecum ([place])   . Portal hypertension ([place])   . Splenomegaly    Past Surgical History:  Procedure Laterality Date  . HEMICOLECTOMY  01/08/03   Family History  Problem Relation Age of Onset  . Arthritis Mother   . Hearing loss Mother   . Heart disease Mother   . Hypertension Mother   . Arthritis Father   . Diabetes Father   . Heart disease Father    Social History   Socioeconomic History  . Marital status: Married    Spouse name: Not on file  . Number of children: 2  . Years of education:  Not on file  . Highest education level: Not on file  Occupational History  . Occupation: Associate Professor  . Financial resource strain: Not on file  . Food insecurity:    Worry: Not on file    Inability: Not on file  . Transportation needs:    Medical: Not on file    Non-medical: Not on file  Tobacco Use  . Smoking status: Never Smoker  . Smokeless tobacco: Never Used  Substance and Sexual Activity  . Alcohol use: Never    Frequency: Never  . Drug use: Never  . Sexual activity: Not Currently    Partners: Male  Lifestyle  . Physical  activity:    Days per week: Not on file    Minutes per session: Not on file  . Stress: Not on file  Relationships  . Social connections:    Talks on phone: Not on file    Gets together: Not on file    Attends religious service: Not on file    Active member of club or organization: Not on file    Attends meetings of clubs or organizations: Not on file    Relationship status: Not on file  Other Topics Concern  . Not on file  Social History Narrative  . Not on file    Outpatient Encounter Medications as of 01/18/2019  Medication Sig  . ELDERBERRY PO Take by mouth. Take 1 tablespoon by mouth daily  . glucosamine-chondroitin 500-400 MG tablet Take 1 tablet by mouth 2 (two) times daily.  Marland Kitchen levothyroxine (SYNTHROID, LEVOTHROID) 50 MCG tablet Take 1 tablet (50 mcg total) by mouth daily.  . Multiple Vitamin (MULTIVITAMIN) capsule Take 1 capsule by mouth daily.  Marland Kitchen omega-3 acid ethyl esters (LOVAZA) 1 g capsule Take 1,000 mg by mouth daily.  . TURMERIC PO Take 1 tablet by mouth daily.  Marland Kitchen VITAMIN A PO Take 3,000 mcg by mouth daily.  . vitamin C (ASCORBIC ACID) 500 MG tablet Take 1,000 mg by mouth daily.   Marland Kitchen ZINC OXIDE PO Take by mouth.   No facility-administered encounter medications on file as of 01/18/2019.     Activities of Daily Living Independent Patient Care Team: Elby Beck, FNP as PCP - General (Nurse Practitioner)  Thornton Park, MD, GI    Assessment:   This is a routine wellness examination for [name].  Exercise Activities and Dietary recommendations    Goals   Continue to use fitness tracker to get at least 5,000 steps a day     Fall Risk Fall Risk  01/18/2019 09/14/2018  Falls in the past year? 0 No    Depression Screen PHQ 2/9 Scores 01/18/2019 09/14/2018  PHQ - 2 Score 0 0     Cognitive Function  Alert and oriented Answers questions appropriately Able to function in her job as a Licensed conveyancer       There is no immunization history on file for  this patient.  Qualifies for Shingles Vaccine?yes- patient instructed to receive at outside pharmacy  Screening Tests Health Maintenance  Topic Date Due  . TETANUS/TDAP  05/29/1970  . COLONOSCOPY  05/29/2001  . DEXA SCAN  05/29/2016  . PNA vac Low Risk Adult (1 of 2 - PCV13) 05/29/2016  . INFLUENZA VACCINE  07/23/2019 (Originally 06/23/2018)  . MAMMOGRAM  07/01/2020  . Hepatitis C Screening  Completed    Cancer Screenings: Lung: Low Dose CT Chest recommended if Age 51-80 years, 30 pack-year currently smoking OR have quit w/in 15years.  Patient does not qualify. Breast:  Up to date on Mammogram? Yes   Up to date of Bone Density/Dexa? No Colorectal: UTD, sees GI  Additional Screenings:  Hepatitis C Screening: 05/14/2011     Plan:    1. Encounter for Medicare annual wellness exam  - Discussed and encouraged healthy lifestyle choices- adequate sleep, regular exercise, stress management and healthy food choices.  - Reviewed health maintenance gaps- she thinks she had immunizations at [facility]- will check records, order for Dexa scan entered to be done with next screening mammogram  2. Decreased GFR - mildly decreased with last labs - Basic Metabolic Panel  3. Hypothyroidism, unspecified type - improved energy and has lost a few pounds - TSH  4. Lesion of nose - Concerning appearance, refer to dermatology - Ambulatory referral to Dermatology  - follow up in 6 months Clarene Reamer, FNP-BC  [facility] [place] [facility]  01/18/2019 4:28 PM    I have personally reviewed and noted the following in the patient's chart:   . Medical and social history . Use of alcohol, tobacco or illicit drugs  . Current medications and supplements . Functional ability and status . Nutritional status . Physical activity . Advanced directives . List of other physicians . Hospitalizations, surgeries, and ER visits in previous 12 months . Vitals . Screenings  to include cognitive, depression, and falls . Referrals and appointments  In addition, I have reviewed and discussed with patient certain preventive protocols, quality metrics, and best practice recommendations. A written personalized care plan for preventive services as well as general preventive health recommendations were provided to patient.     Elby Beck, FNP  01/18/2019

## 2019-01-18 NOTE — Patient Instructions (Signed)
  [name] , Thank you for taking time to come for your Medicare Wellness Visit. I appreciate your ongoing commitment to your health goals. Please review the following plan we discussed and let me know if I can assist you in the future.   I will check your medical record for immunizations and we will discuss further at your 6 months follow up.   I will order a dexa scan for you to have with your next mammogram  You will get a call about an appointment with dermatology  These are the goals we discussed: Goals   Continue to use step tracker to increase daily activity, aim for 5000 steps a day     This is a list of the screening recommended for you and due dates:  Inquire at your local pharmacy about shingrix vaccine  Health Maintenance  Topic Date Due  . Tetanus Vaccine  05/29/1970  . Colon Cancer Screening  05/29/2001  . DEXA scan (bone density measurement)  05/29/2016  . Pneumonia vaccines (1 of 2 - PCV13) 05/29/2016  . Flu Shot  07/23/2019*  . Mammogram  07/01/2020  .  Hepatitis C: One time screening is recommended by Center for Disease Control  (CDC) for  adults born from [birth year] through 1965.   Completed  *Topic was postponed. The date shown is not the original due date.

## 2019-01-19 LAB — BASIC METABOLIC PANEL
BUN: 13 mg/dL (ref 6–23)
CO2: 30 mEq/L (ref 19–32)
Calcium: 9.8 mg/dL (ref 8.4–10.5)
Chloride: 104 mEq/L (ref 96–112)
Creatinine, Ser: 0.8 mg/dL (ref 0.40–1.20)
GFR: 71.41 mL/min (ref >60.00)
Glucose, Bld: 74 mg/dL (ref 70–99)
POTASSIUM: 4.2 meq/L (ref 3.5–5.1)
Sodium: 141 mEq/L (ref 135–145)

## 2019-01-19 LAB — TSH: TSH: 15.24 u[IU]/mL — AB (ref 0.35–4.50)

## 2019-01-20 ENCOUNTER — Telehealth: Payer: Self-pay | Admitting: Family Medicine

## 2019-01-20 MED ORDER — LEVOTHYROXINE SODIUM 75 MCG PO TABS: 75.0000 ug | ORAL_TABLET | Freq: Every day | ORAL | 3 refills | Status: DC

## 2019-01-20 NOTE — Telephone Encounter (Signed)
Left message asking pt to call office regarding bone density.  Transfer to [place].  If im not here any one up front can help pt

## 2019-01-20 NOTE — Addendum Note (Signed)
Addended by: Clarene Reamer B on: 01/20/2019 07:47 AM   Modules accepted: Orders

## 2019-01-21 ENCOUNTER — Encounter: Payer: Self-pay | Admitting: Family Medicine

## 2019-01-24 ENCOUNTER — Other Ambulatory Visit: Payer: Self-pay | Admitting: Family Medicine

## 2019-01-24 DIAGNOSIS — E039 Hypothyroidism, unspecified: Secondary | ICD-10-CM

## 2019-01-24 MED ORDER — SYNTHROID 75 MCG PO TABS: 75.0000 ug | ORAL_TABLET | Freq: Every day | ORAL | 3 refills | Status: DC

## 2019-01-24 NOTE — Progress Notes (Signed)
Synthroid brand sent to patient's pharmacy per her request

## 2019-01-25 ENCOUNTER — Telehealth: Payer: Self-pay | Admitting: Family Medicine

## 2019-01-25 NOTE — Telephone Encounter (Signed)
Called patient back and lab results were given to her and she verbalized understanding. Patient stated that she has a follow-up appointment already scheduled.

## 2019-01-25 NOTE — Telephone Encounter (Signed)
Pt returning call to nurse

## 2019-03-13 ENCOUNTER — Other Ambulatory Visit: Payer: Self-pay | Admitting: Family Medicine

## 2019-03-13 DIAGNOSIS — E039 Hypothyroidism, unspecified: Secondary | ICD-10-CM

## 2019-04-21 ENCOUNTER — Other Ambulatory Visit: Payer: Self-pay | Admitting: Family Medicine

## 2019-04-21 DIAGNOSIS — Z1231 Encounter for screening mammogram for malignant neoplasm of breast: Secondary | ICD-10-CM

## 2019-04-25 DIAGNOSIS — L0100 Impetigo, unspecified: Secondary | ICD-10-CM | POA: Diagnosis not present

## 2019-05-16 DIAGNOSIS — L0100 Impetigo, unspecified: Secondary | ICD-10-CM | POA: Diagnosis not present

## 2019-05-16 DIAGNOSIS — C44319 Basal cell carcinoma of skin of other parts of face: Secondary | ICD-10-CM | POA: Diagnosis not present

## 2019-05-16 DIAGNOSIS — C44311 Basal cell carcinoma of skin of nose: Secondary | ICD-10-CM | POA: Diagnosis not present

## 2019-05-30 DIAGNOSIS — C44311 Basal cell carcinoma of skin of nose: Secondary | ICD-10-CM | POA: Diagnosis not present

## 2019-05-30 DIAGNOSIS — Z08 Encounter for follow-up examination after completed treatment for malignant neoplasm: Secondary | ICD-10-CM | POA: Diagnosis not present

## 2019-05-30 DIAGNOSIS — Z85828 Personal history of other malignant neoplasm of skin: Secondary | ICD-10-CM | POA: Diagnosis not present

## 2019-06-22 DIAGNOSIS — C44319 Basal cell carcinoma of skin of other parts of face: Secondary | ICD-10-CM | POA: Diagnosis not present

## 2019-06-22 DIAGNOSIS — D485 Neoplasm of uncertain behavior of skin: Secondary | ICD-10-CM | POA: Diagnosis not present

## 2019-06-22 DIAGNOSIS — C44311 Basal cell carcinoma of skin of nose: Secondary | ICD-10-CM | POA: Diagnosis not present

## 2019-06-30 ENCOUNTER — Other Ambulatory Visit: Payer: Self-pay

## 2019-06-30 ENCOUNTER — Encounter: Payer: Self-pay | Admitting: Family Medicine

## 2019-06-30 ENCOUNTER — Ambulatory Visit (INDEPENDENT_AMBULATORY_CARE_PROVIDER_SITE_OTHER): Payer: Medicare Other | Admitting: Family Medicine

## 2019-06-30 VITALS — BP 138/80 | HR 82 | Temp 98.3°F | Ht 63.9 in | Wt 251.4 lb

## 2019-06-30 DIAGNOSIS — E039 Hypothyroidism, unspecified: Secondary | ICD-10-CM | POA: Insufficient documentation

## 2019-06-30 DIAGNOSIS — Z2820 Immunization not carried out because of patient decision for unspecified reason: Secondary | ICD-10-CM | POA: Diagnosis not present

## 2019-06-30 DIAGNOSIS — C44311 Basal cell carcinoma of skin of nose: Secondary | ICD-10-CM | POA: Insufficient documentation

## 2019-06-30 DIAGNOSIS — Z85828 Personal history of other malignant neoplasm of skin: Secondary | ICD-10-CM | POA: Insufficient documentation

## 2019-06-30 NOTE — Progress Notes (Signed)
Subjective:    Patient ID: [name], female    DOB: [birth year], 68 y.o.   MRN: [id number]  HPI This is a 68 yo female, accompanied by her husband, who presents today for follow up of chronic medical conditions.  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  She declines annual flu shot as well as shingles vaccine and pneumonia vaccination.  Hypothyroidism- has been doing well on Synthroid 75 mcg dose.  Energy level good.  Will check TSH today.  Skin lesion of nose- she saw Dr. [place] Crane in June.  She was diagnosed with skin cancer?  Basal cell.  She is going to the skin surgery Center where she has had additional scraping and biopsies done.  She has follow-up scheduled with the skin surgery center as well as with Dr. Marla Roe who is a Psychiatric nurse for further excision of lesion.  Past Medical History:  Diagnosis Date  . Cancer ([place])    cecum  . Elevated liver function tests   . Iron deficiency anemia   . Malignant neoplasm of cecum ([place])   . Portal hypertension ([place])   . Splenomegaly    Past Surgical History:  Procedure Laterality Date  . HEMICOLECTOMY  01/08/03   Family History  Problem Relation Age of Onset  . Arthritis Mother   . Hearing loss Mother   . Heart disease Mother   . Hypertension Mother   . Arthritis Father   . Diabetes Father   . Heart disease Father    Social History   Tobacco Use  . Smoking status: Never Smoker  . Smokeless tobacco: Never Used  Substance Use Topics  . Alcohol use: Never    Frequency: Never  . Drug use: Never      Review of Systems Per HPI    Objective:   Physical Exam Vitals signs reviewed.  Constitutional:      General: She is not in acute distress.    Appearance: Normal appearance. She is obese. She is not ill-appearing, toxic-appearing or diaphoretic.  Eyes:     Conjunctiva/sclera: Conjunctivae normal.  Cardiovascular:     Rate and Rhythm: Normal rate.  Pulmonary:     Effort: Pulmonary effort is normal.  Neurological:     Mental Status: She is alert and oriented to person, place, and time.  Psychiatric:        Mood and Affect: Mood normal.        Behavior: Behavior normal.        Thought Content: Thought content normal.        Judgment: Judgment normal.       BP 138/80 (BP Location: Left Arm, Patient Position: Sitting, Cuff Size: Large)   Pulse 82   Temp 98.3 F (36.8 C) (Temporal)   Ht 5' 3.9" (1.623 m)   Wt 251 lb 6.4 oz (114 kg)   SpO2 94%   BMI 43.29 kg/m  Wt Readings from Last 3 Encounters:  06/30/19 251 lb 6.4 oz (114 kg)  01/18/19 256 lb 12.8 oz (116.5 kg)  11/11/18 255 lb (115.7 kg)       Assessment & Plan:  1. Hypothyroidism, unspecified type - TSH  2. Basal cell carcinoma of nose - continued follow up with [facility] and Plastic surgery  3. Vaccine refused by patient -Encouraged flu vaccine as well as Shingrix and pneumococcal.  Patient declines.  Follow-up in 6 months for CPE.   Neoma Laming  Carlean Purl, FNP-BC  [facility] [place] [facility]  06/30/2019 6:06 PM

## 2019-06-30 NOTE — Patient Instructions (Signed)
Good to see you today  Please schedule your annual exam for 6 months from now  I will notify you of lab results and any necessary adjustment to your medication

## 2019-07-01 LAB — TSH: TSH: 1.77 mIU/L (ref 0.40–4.50)

## 2019-07-10 ENCOUNTER — Other Ambulatory Visit: Payer: Medicare Other

## 2019-07-10 ENCOUNTER — Ambulatory Visit
Admission: RE | Admit: 2019-07-10 | Discharge: 2019-07-10 | Disposition: A | Discharge status: Home / Self Care | Payer: Medicare Other | Source: Ambulatory Visit | Attending: Family Medicine | Admitting: Family Medicine

## 2019-07-10 ENCOUNTER — Other Ambulatory Visit: Payer: Self-pay

## 2019-07-10 DIAGNOSIS — Z780 Asymptomatic menopausal state: Secondary | ICD-10-CM | POA: Diagnosis not present

## 2019-07-10 DIAGNOSIS — Z13820 Encounter for screening for osteoporosis: Secondary | ICD-10-CM

## 2019-07-10 DIAGNOSIS — Z1231 Encounter for screening mammogram for malignant neoplasm of breast: Secondary | ICD-10-CM

## 2019-07-10 DIAGNOSIS — E2839 Other primary ovarian failure: Secondary | ICD-10-CM

## 2019-07-10 DIAGNOSIS — M85852 Other specified disorders of bone density and structure, left thigh: Secondary | ICD-10-CM | POA: Diagnosis not present

## 2019-07-12 ENCOUNTER — Other Ambulatory Visit: Payer: Self-pay | Admitting: Family Medicine

## 2019-07-12 DIAGNOSIS — R928 Other abnormal and inconclusive findings on diagnostic imaging of breast: Secondary | ICD-10-CM

## 2019-07-18 ENCOUNTER — Institutional Professional Consult (permissible substitution): Payer: Medicare Other | Admitting: Plastic Surgery

## 2019-07-28 ENCOUNTER — Encounter: Payer: Self-pay | Admitting: Plastic Surgery

## 2019-07-28 ENCOUNTER — Ambulatory Visit: Payer: Medicare Other | Admitting: Plastic Surgery

## 2019-07-28 ENCOUNTER — Other Ambulatory Visit: Payer: Self-pay

## 2019-07-28 VITALS — BP 137/74 | HR 85 | Temp 98.6°F | Ht 64.0 in | Wt 253.0 lb

## 2019-07-28 DIAGNOSIS — C44311 Basal cell carcinoma of skin of nose: Secondary | ICD-10-CM | POA: Diagnosis not present

## 2019-07-28 NOTE — Progress Notes (Signed)
Patient ID: [name], female    DOB: 23-Jun-1951, 68 y.o.   MRN: [id number]   Chief Complaint  Patient presents with  . Skin Problem    The patient is a 68 yrs old wf here with her husband for consultation of her nose.  She was diagnosed with basal cell carcinoma of her nose by biopsy.  She is scheduled for mohs resection with [facility] in October.  The tip and possible ala is involved.  We are not sure of the depth yet.  She is otherwise in reasonable health.  She is 5 feet 4 inches tall and weighs 253 pounds.     Review of Systems  Constitutional: Negative for activity change and appetite change.  HENT: Negative.   Eyes: Negative.   Respiratory: Negative.   Cardiovascular: Negative.   Gastrointestinal: Negative.   Endocrine: Negative.   Genitourinary: Negative.   Musculoskeletal: Negative.   Hematological: Negative.   Psychiatric/Behavioral: Negative.     Past Medical History:  Diagnosis Date  . Cancer ([place])    cecum  . Elevated liver function tests   . Iron deficiency anemia   . Malignant neoplasm of cecum ([place])   . Portal hypertension ([place])   . Splenomegaly     Past Surgical History:  Procedure Laterality Date  . HEMICOLECTOMY  01/08/03      Current Outpatient Medications:  .  ELDERBERRY PO, Take 1,200 mg by mouth. Take 1 tablespoon by mouth daily , Disp: , Rfl:  .  glucosamine-chondroitin 500-400 MG tablet, Take 1,500 tablets by mouth 2 (two) times daily. , Disp: , Rfl:  .  Multiple Vitamin (MULTIVITAMIN) capsule, Take 1 capsule by mouth daily., Disp: , Rfl:  .  omega-3 acid ethyl esters (LOVAZA) 1 g capsule, Take 1,200 mg by mouth daily. , Disp: , Rfl:  .  SYNTHROID 75 MCG tablet, Take 1 tablet (75 mcg total) by mouth daily before breakfast., Disp: 90 tablet, Rfl: 3 .  TURMERIC PO, Take 1,000 tablets by mouth daily. , Disp: , Rfl:  .  VITAMIN A PO, Take 2,400 mcg by mouth daily. , Disp: , Rfl:  .  vitamin C (ASCORBIC ACID) 500 MG tablet, Take  by mouth daily. , Disp: , Rfl:  .  ZINC OXIDE PO, Take 50 mg by mouth. , Disp: , Rfl:    Objective:   Vitals:   07/28/19 1514  BP: 137/74  Pulse: 85  Temp: 98.6 F (37 C)  SpO2: 94%    Physical Exam Vitals signs and nursing note reviewed.  Constitutional:      Appearance: Normal appearance.  HENT:     Head: Normocephalic and atraumatic.     Nose: Nose normal.  Eyes:     Extraocular Movements: Extraocular movements intact.  Cardiovascular:     Rate and Rhythm: Normal rate.     Pulses: Normal pulses.  Pulmonary:     Effort: Pulmonary effort is normal.  Abdominal:     General: Abdomen is flat. There is no distension.     Tenderness: There is no abdominal tenderness.  Skin:    General: Skin is warm.  Neurological:     General: No focal deficit present.     Mental Status: She is alert and oriented to person, place, and time.  Psychiatric:        Mood and Affect: Mood normal.        Behavior: Behavior normal.  Thought Content: Thought content normal.        Judgment: Judgment normal.     Assessment & Plan:  Basal cell carcinoma of nose We discussed options for reconstruction will depend upon the defect.  It could be a direct closure, rotational flap, skin graft or even a larger flap like a forehead.  She is aware we will know more once we know what her defect is.  She agrees with the plan. Pictures were obtained of the patient and placed in the chart with the patient's or guardian's permission.  The risks that can be encountered with and after skin lesion were discussed and include the following but not limited to these: bleeding, infection, delayed healing, anesthesia risks, skin sensation changes, injury to structures including nerves, blood vessels, and muscles which may be temporary or permanent, allergies to tape, suture materials and glues, blood products, topical preparations or injected agents, skin contour irregularities, skin discoloration and swelling,  deep vein thrombosis, cardiac and pulmonary complications, pain, which may persist, persistent pain, recurrence of the lesion, poor healing of the incision, possible need for revisional surgery or staged procedures.    [place], DO

## 2019-08-16 ENCOUNTER — Ambulatory Visit
Admission: RE | Admit: 2019-08-16 | Discharge: 2019-08-16 | Disposition: A | Discharge status: Home / Self Care | Payer: Medicare Other | Source: Ambulatory Visit | Attending: Family Medicine | Admitting: Family Medicine

## 2019-08-16 ENCOUNTER — Other Ambulatory Visit: Payer: Self-pay

## 2019-08-16 DIAGNOSIS — R928 Other abnormal and inconclusive findings on diagnostic imaging of breast: Secondary | ICD-10-CM

## 2019-08-16 DIAGNOSIS — N6002 Solitary cyst of left breast: Secondary | ICD-10-CM | POA: Diagnosis not present

## 2019-08-29 DIAGNOSIS — C44311 Basal cell carcinoma of skin of nose: Secondary | ICD-10-CM | POA: Diagnosis not present

## 2019-08-30 ENCOUNTER — Telehealth: Payer: Self-pay

## 2019-08-30 NOTE — Telephone Encounter (Signed)
error 

## 2019-08-31 ENCOUNTER — Other Ambulatory Visit: Payer: Self-pay

## 2019-08-31 ENCOUNTER — Ambulatory Visit: Payer: Medicare Other | Admitting: Surgical

## 2019-08-31 ENCOUNTER — Encounter: Payer: Self-pay | Admitting: Surgical

## 2019-08-31 VITALS — BP 174/95 | HR 93 | Temp 98.4°F | Ht 64.0 in

## 2019-08-31 DIAGNOSIS — C44311 Basal cell carcinoma of skin of nose: Secondary | ICD-10-CM

## 2019-08-31 NOTE — Progress Notes (Signed)
Subjective:     Patient ID: [name], female    DOB: 05/26/1951, 68 y.o.   MRN: [id number]  Chief Complaint  Patient presents with  . Follow-up    on nose after mohs resection of basal cell carcinoma    HPI: The patient is a 68 y.o. female here for follow-up after Mohs procedure by Dr. Winifred Olive on her nose for [facility] 2 days ago. Patient called the clinic this morning stating she sneezed and her nose started bleeding at approximately 10am.  Her husband is with her at the bedside.  On arrival to the clinic she reports that she has been holding pressure for approximately 2 hours and the bleeding has slowed down but she is still bleeding.  She reports that the Mohs procedure was quite extensive and she had to go back 7 times for further resection.  She does not have any dizziness, headache, blurred vision, chest pain, palpitations.  She is very shaky on exam and anxious. No fever, chills, n/v.  Review of Systems  Constitutional: Negative for chills, diaphoresis, fever, malaise/fatigue and weight loss.  Eyes: Negative for blurred vision and double vision.  Respiratory: Negative.   Cardiovascular: Negative.   Gastrointestinal: Negative for abdominal pain, diarrhea, nausea and vomiting.  Genitourinary: Negative.   Musculoskeletal: Negative.   Skin: Negative for itching and rash.       + Wound  Neurological: Positive for sensory change. Negative for dizziness, tingling, focal weakness, weakness and headaches.  Endo/Heme/Allergies: Bruises/bleeds easily.  Psychiatric/Behavioral: The patient is nervous/anxious.     Objective:   Vital Signs BP (!) 174/95 (BP Location: Left Arm, Patient Position: Sitting, Cuff Size: Large)   Pulse 93   Temp 98.4 F (36.9 C) (Temporal)   Ht 5\' 4"  (1.626 m)   SpO2 96%   BMI 43.43 kg/m  Vital Signs and Nursing Note Reviewed  Physical Exam  Constitutional: She is oriented to person, place, and time and well-developed, well-nourished, and in no  distress.  HENT:  Head: Normocephalic and atraumatic.  Nose:      Eyes: Pupils are equal, round, and reactive to light. Conjunctivae and EOM are normal.  Neck: Normal range of motion. Neck supple.  Cardiovascular: Normal rate.  Pulmonary/Chest: Effort normal.  Musculoskeletal: Normal range of motion.  Neurological: She is alert and oriented to person, place, and time. Gait normal.  Skin: Skin is warm and dry. No rash noted. She is not diaphoretic. No erythema. No pallor.  Psychiatric: Mood and affect normal.    Assessment/Plan:     ICD-10-CM   1. Basal cell carcinoma of nose  C44.311     After removal of dressings, the area was injected with 1% lidocaine with epinephrine, pressure was held with 1% lidocaine with epinephrine soaked gauze and the area was cauterized with the Bovie by Dr. Claudia Desanctis after bleeding did not respond to local pressure.  She tolerated the procedure well.  There was no more bleeding and the area was dry.   Bacitracin ointment, Xeroform, 4 x 4 gauze, tape were placed over the area.  She was instructed to do this daily for the next week and then follow-up in the clinic for reevaluation.  Plan for full-thickness skin graft by Dr. pace in the next few weeks.  Patient understands and knows to call with any questions or concerns. Pictures were obtained of the patient and placed in the chart with the patient's or guardian's permission.   Carola [name] Bobby Ragan, PA-C 08/31/2019, 3:28 PM

## 2019-08-31 NOTE — H&P (View-Only) (Signed)
Subjective:     Patient ID: [name], female    DOB: [birth year], 68 y.o.   MRN: [id number]  Chief Complaint  Patient presents with  . Follow-up    on nose after mohs resection of basal cell carcinoma    HPI: The patient is a 68 y.o. female here for follow-up after Mohs procedure by Dr. Winifred Olive on her nose for [facility] 2 days ago. Patient called the clinic this morning stating she sneezed and her nose started bleeding at approximately 10am.  Her husband is with her at the bedside.  On arrival to the clinic she reports that she has been holding pressure for approximately 2 hours and the bleeding has slowed down but she is still bleeding.  She reports that the Mohs procedure was quite extensive and she had to go back 7 times for further resection.  She does not have any dizziness, headache, blurred vision, chest pain, palpitations.  She is very shaky on exam and anxious. No fever, chills, n/v.  Review of Systems  Constitutional: Negative for chills, diaphoresis, fever, malaise/fatigue and weight loss.  Eyes: Negative for blurred vision and double vision.  Respiratory: Negative.   Cardiovascular: Negative.   Gastrointestinal: Negative for abdominal pain, diarrhea, nausea and vomiting.  Genitourinary: Negative.   Musculoskeletal: Negative.   Skin: Negative for itching and rash.       + Wound  Neurological: Positive for sensory change. Negative for dizziness, tingling, focal weakness, weakness and headaches.  Endo/Heme/Allergies: Bruises/bleeds easily.  Psychiatric/Behavioral: The patient is nervous/anxious.     Objective:   Vital Signs BP (!) 174/95 (BP Location: Left Arm, Patient Position: Sitting, Cuff Size: Large)   Pulse 93   Temp 98.4 F (36.9 C) (Temporal)   Ht 5\' 4"  (1.626 m)   SpO2 96%   BMI 43.43 kg/m  Vital Signs and Nursing Note Reviewed  Physical Exam  Constitutional: She is oriented to person, place, and time and well-developed, well-nourished, and in no  distress.  HENT:  Head: Normocephalic and atraumatic.  Nose:      Eyes: Pupils are equal, round, and reactive to light. Conjunctivae and EOM are normal.  Neck: Normal range of motion. Neck supple.  Cardiovascular: Normal rate.  Pulmonary/Chest: Effort normal.  Musculoskeletal: Normal range of motion.  Neurological: She is alert and oriented to person, place, and time. Gait normal.  Skin: Skin is warm and dry. No rash noted. She is not diaphoretic. No erythema. No pallor.  Psychiatric: Mood and affect normal.    Assessment/Plan:     ICD-10-CM   1. Basal cell carcinoma of nose  C44.311     After removal of dressings, the area was injected with 1% lidocaine with epinephrine, pressure was held with 1% lidocaine with epinephrine soaked gauze and the area was cauterized with the Bovie by Dr. Claudia Desanctis after bleeding did not respond to local pressure.  She tolerated the procedure well.  There was no more bleeding and the area was dry.   Bacitracin ointment, Xeroform, 4 x 4 gauze, tape were placed over the area.  She was instructed to do this daily for the next week and then follow-up in the clinic for reevaluation.  Plan for full-thickness skin graft by Dr. pace in the next few weeks.  Patient understands and knows to call with any questions or concerns. Pictures were obtained of the patient and placed in the chart with the patient's or guardian's permission.   Carola [name] Lahna Nath, PA-C 08/31/2019, 3:28 PM

## 2019-09-05 ENCOUNTER — Encounter (HOSPITAL_BASED_OUTPATIENT_CLINIC_OR_DEPARTMENT_OTHER): Payer: Self-pay

## 2019-09-05 ENCOUNTER — Other Ambulatory Visit: Payer: Self-pay

## 2019-09-06 ENCOUNTER — Ambulatory Visit (INDEPENDENT_AMBULATORY_CARE_PROVIDER_SITE_OTHER): Payer: Medicare Other | Admitting: Plastic Surgery

## 2019-09-06 VITALS — BP 136/78 | HR 77 | Temp 99.1°F

## 2019-09-06 DIAGNOSIS — C44311 Basal cell carcinoma of skin of nose: Secondary | ICD-10-CM | POA: Diagnosis not present

## 2019-09-06 NOTE — Progress Notes (Signed)
   Referring Provider [address], [place],  [place] [address]   CC: No chief complaint on file. Nasal defect after Mohs surgery  [name] is an 68 y.o. female.  HPI: Patient is here for follow-up after being seen last week for bleeding from Mohs surgery defect of the tip of her nose.  She overall feels much better today she has had no further issues with bleeding her husband is doing the wound care regularly.  She has some questions regarding her upcoming surgery.  Physical Exam Vitals with BMI 09/06/2019 09/05/2019 08/31/2019  Height - 5\' 4"  5\' 4"   Weight - 205 lbs (No Data)  BMI - 0000000 -  Systolic [id number] - [id number]  Diastolic 78 - 95  Pulse 77 - 93    The nasal wound is gradually granulating in.  There is some red granulation tissue and some fibrinous tissue as well.  There is no signs of infection.  Assessment/Plan Patient is doing well with wound care to her Mohs surgery defect on her nose.  We decided to take the skin graft from the inner aspect of her left arm.  The color of her skin between her arm and her face is actually reasonably good match and it would avoid any further supraclavicular scars.  We discussed the expected postoperative course including the bolster needing to stay on for 1 week's time.  We discussed the risk of the procedure that include bleeding infection, scarring, wound healing complications, partial skin graft loss, need for further procedures.  She is fully understanding and we will plan to continue wound care and do her surgery next week.  Cindra Presume 09/06/2019, 3:08 PM

## 2019-09-08 ENCOUNTER — Other Ambulatory Visit (HOSPITAL_COMMUNITY)
Admission: RE | Admit: 2019-09-08 | Discharge: 2019-09-08 | Disposition: A | Discharge status: Home / Self Care | Payer: Medicare Other | Source: Ambulatory Visit | Attending: Plastic Surgery | Admitting: Plastic Surgery

## 2019-09-08 DIAGNOSIS — Z20828 Contact with and (suspected) exposure to other viral communicable diseases: Secondary | ICD-10-CM | POA: Insufficient documentation

## 2019-09-09 LAB — NOVEL CORONAVIRUS, NAA (HOSP ORDER, SEND-OUT TO REF LAB; TAT 18-24 HRS): SARS-CoV-2, NAA: NOT DETECTED

## 2019-09-12 ENCOUNTER — Ambulatory Visit (HOSPITAL_BASED_OUTPATIENT_CLINIC_OR_DEPARTMENT_OTHER): Payer: Medicare Other | Admitting: Anesthesiology

## 2019-09-12 ENCOUNTER — Ambulatory Visit (HOSPITAL_BASED_OUTPATIENT_CLINIC_OR_DEPARTMENT_OTHER)
Admission: RE | Admit: 2019-09-12 | Discharge: 2019-09-12 | Disposition: A | Discharge status: Home / Self Care | Payer: Medicare Other | Attending: Plastic Surgery | Admitting: Plastic Surgery

## 2019-09-12 ENCOUNTER — Encounter (HOSPITAL_BASED_OUTPATIENT_CLINIC_OR_DEPARTMENT_OTHER)
Admission: RE | Disposition: A | Discharge status: Home / Self Care | Payer: Self-pay | Source: Home / Self Care | Attending: Plastic Surgery

## 2019-09-12 ENCOUNTER — Encounter (HOSPITAL_BASED_OUTPATIENT_CLINIC_OR_DEPARTMENT_OTHER): Payer: Self-pay

## 2019-09-12 ENCOUNTER — Other Ambulatory Visit: Payer: Self-pay

## 2019-09-12 DIAGNOSIS — E039 Hypothyroidism, unspecified: Secondary | ICD-10-CM | POA: Diagnosis not present

## 2019-09-12 DIAGNOSIS — C189 Malignant neoplasm of colon, unspecified: Secondary | ICD-10-CM | POA: Diagnosis not present

## 2019-09-12 DIAGNOSIS — C44311 Basal cell carcinoma of skin of nose: Secondary | ICD-10-CM | POA: Diagnosis not present

## 2019-09-12 DIAGNOSIS — M950 Acquired deformity of nose: Secondary | ICD-10-CM | POA: Diagnosis not present

## 2019-09-12 DIAGNOSIS — Z6841 Body Mass Index (BMI) 40.0 and over, adult: Secondary | ICD-10-CM | POA: Diagnosis not present

## 2019-09-12 DIAGNOSIS — Z85828 Personal history of other malignant neoplasm of skin: Secondary | ICD-10-CM | POA: Insufficient documentation

## 2019-09-12 DIAGNOSIS — I8500 Esophageal varices without bleeding: Secondary | ICD-10-CM | POA: Diagnosis not present

## 2019-09-12 HISTORY — DX: Liver disease, unspecified: K76.9

## 2019-09-12 HISTORY — DX: Hypothyroidism, unspecified: E03.9

## 2019-09-12 HISTORY — PX: SKIN FULL THICKNESS GRAFT: SHX442

## 2019-09-12 HISTORY — DX: Esophageal varices without bleeding: I85.00

## 2019-09-12 SURGERY — APPLICATION, GRAFT, SKIN, FULL-THICKNESS
Anesthesia: General | Site: Nose

## 2019-09-12 MED ORDER — METOCLOPRAMIDE HCL 5 MG/ML IJ SOLN
INTRAMUSCULAR | Status: AC
  Filled 2019-09-12: qty 2

## 2019-09-12 MED ORDER — ARTIFICIAL TEARS OPHTHALMIC OINT
TOPICAL_OINTMENT | OPHTHALMIC | Status: DC | PRN
  Administered 2019-09-12: 1 via OPHTHALMIC

## 2019-09-12 MED ORDER — PROPOFOL 10 MG/ML IV BOLUS
INTRAVENOUS | Status: DC | PRN
  Administered 2019-09-12: 150 mg via INTRAVENOUS
  Administered 2019-09-12: 50 mg via INTRAVENOUS

## 2019-09-12 MED ORDER — MIDAZOLAM HCL 2 MG/2ML IJ SOLN
INTRAMUSCULAR | Status: AC
  Filled 2019-09-12: qty 2

## 2019-09-12 MED ORDER — METOCLOPRAMIDE HCL 5 MG/ML IJ SOLN
10.0000 mg | Freq: Once | INTRAMUSCULAR | Status: AC | PRN
  Administered 2019-09-12: 15:00:00 10 mg via INTRAVENOUS

## 2019-09-12 MED ORDER — PROPOFOL 10 MG/ML IV BOLUS
INTRAVENOUS | Status: AC
  Filled 2019-09-12: qty 20

## 2019-09-12 MED ORDER — ONDANSETRON HCL 4 MG/2ML IJ SOLN
INTRAMUSCULAR | Status: DC | PRN
  Administered 2019-09-12: 4 mg via INTRAVENOUS

## 2019-09-12 MED ORDER — ROCURONIUM BROMIDE 100 MG/10ML IV SOLN
INTRAVENOUS | Status: DC | PRN
  Administered 2019-09-12: 70 mg via INTRAVENOUS

## 2019-09-12 MED ORDER — LIDOCAINE 2% (20 MG/ML) 5 ML SYRINGE
INTRAMUSCULAR | Status: AC
  Filled 2019-09-12: qty 5

## 2019-09-12 MED ORDER — MEPERIDINE HCL 25 MG/ML IJ SOLN: 6.2500 mg | INTRAMUSCULAR | Status: DC | PRN

## 2019-09-12 MED ORDER — BACITRACIN ZINC 500 UNIT/GM EX OINT
TOPICAL_OINTMENT | CUTANEOUS | Status: AC
  Filled 2019-09-12: qty 3.6

## 2019-09-12 MED ORDER — CEFAZOLIN SODIUM-DEXTROSE 2-4 GM/100ML-% IV SOLN
2.0000 g | INTRAVENOUS | Status: AC
  Administered 2019-09-12: 2 g via INTRAVENOUS

## 2019-09-12 MED ORDER — DEXAMETHASONE SODIUM PHOSPHATE 10 MG/ML IJ SOLN
INTRAMUSCULAR | Status: AC
  Filled 2019-09-12: qty 1

## 2019-09-12 MED ORDER — GLYCOPYRROLATE PF 0.2 MG/ML IJ SOSY
PREFILLED_SYRINGE | INTRAMUSCULAR | Status: AC
  Filled 2019-09-12: qty 1

## 2019-09-12 MED ORDER — FENTANYL CITRATE (PF) 100 MCG/2ML IJ SOLN
25.0000 ug | INTRAMUSCULAR | Status: DC | PRN
  Administered 2019-09-12 (×2): 25 ug via INTRAVENOUS

## 2019-09-12 MED ORDER — GLYCOPYRROLATE 0.2 MG/ML IJ SOLN
INTRAMUSCULAR | Status: DC | PRN
  Administered 2019-09-12: 0.2 mg via INTRAVENOUS

## 2019-09-12 MED ORDER — EPHEDRINE SULFATE 50 MG/ML IJ SOLN
INTRAMUSCULAR | Status: DC | PRN
  Administered 2019-09-12: 10 mg via INTRAVENOUS

## 2019-09-12 MED ORDER — BUPIVACAINE-EPINEPHRINE 0.25% -1:200000 IJ SOLN
INTRAMUSCULAR | Status: DC | PRN
  Administered 2019-09-12: 20 mL

## 2019-09-12 MED ORDER — LIDOCAINE-EPINEPHRINE 1 %-1:100000 IJ SOLN
INTRAMUSCULAR | Status: AC
  Filled 2019-09-12: qty 1

## 2019-09-12 MED ORDER — BACITRACIN ZINC 500 UNIT/GM EX OINT
TOPICAL_OINTMENT | CUTANEOUS | Status: AC
  Filled 2019-09-12: qty 28.35

## 2019-09-12 MED ORDER — CEFAZOLIN SODIUM-DEXTROSE 2-4 GM/100ML-% IV SOLN
INTRAVENOUS | Status: AC
  Filled 2019-09-12: qty 100

## 2019-09-12 MED ORDER — ONDANSETRON HCL 4 MG/2ML IJ SOLN
INTRAMUSCULAR | Status: AC
  Filled 2019-09-12: qty 2

## 2019-09-12 MED ORDER — LIDOCAINE HCL (CARDIAC) PF 100 MG/5ML IV SOSY
PREFILLED_SYRINGE | INTRAVENOUS | Status: DC | PRN
  Administered 2019-09-12: 100 mg via INTRATRACHEAL

## 2019-09-12 MED ORDER — BUPIVACAINE HCL (PF) 0.25 % IJ SOLN
INTRAMUSCULAR | Status: AC
  Filled 2019-09-12: qty 30

## 2019-09-12 MED ORDER — SUGAMMADEX SODIUM 200 MG/2ML IV SOLN
INTRAVENOUS | Status: DC | PRN
  Administered 2019-09-12: 200 mg via INTRAVENOUS

## 2019-09-12 MED ORDER — FENTANYL CITRATE (PF) 100 MCG/2ML IJ SOLN
INTRAMUSCULAR | Status: AC
  Filled 2019-09-12: qty 2

## 2019-09-12 MED ORDER — ARTIFICIAL TEARS OPHTHALMIC OINT
TOPICAL_OINTMENT | OPHTHALMIC | Status: AC
  Filled 2019-09-12: qty 3.5

## 2019-09-12 MED ORDER — MIDAZOLAM HCL 2 MG/2ML IJ SOLN
INTRAMUSCULAR | Status: DC | PRN
  Administered 2019-09-12: 2 mg via INTRAVENOUS

## 2019-09-12 MED ORDER — LIDOCAINE-EPINEPHRINE (PF) 1 %-1:200000 IJ SOLN
INTRAMUSCULAR | Status: AC
  Filled 2019-09-12: qty 30

## 2019-09-12 MED ORDER — DEXAMETHASONE SODIUM PHOSPHATE 10 MG/ML IJ SOLN
INTRAMUSCULAR | Status: DC | PRN
  Administered 2019-09-12: 10 mg via INTRAVENOUS

## 2019-09-12 MED ORDER — PHENYLEPHRINE 40 MCG/ML (10ML) SYRINGE FOR IV PUSH (FOR BLOOD PRESSURE SUPPORT)
PREFILLED_SYRINGE | INTRAVENOUS | Status: AC
  Filled 2019-09-12: qty 10

## 2019-09-12 MED ORDER — LACTATED RINGERS IV SOLN
INTRAVENOUS | Status: DC
  Administered 2019-09-12 (×2): via INTRAVENOUS

## 2019-09-12 MED ORDER — PHENYLEPHRINE HCL (PRESSORS) 10 MG/ML IV SOLN
INTRAVENOUS | Status: DC | PRN
  Administered 2019-09-12 (×3): 120 ug via INTRAVENOUS

## 2019-09-12 MED ORDER — FENTANYL CITRATE (PF) 100 MCG/2ML IJ SOLN
INTRAMUSCULAR | Status: DC | PRN
  Administered 2019-09-12 (×2): 50 ug via INTRAVENOUS

## 2019-09-12 SURGICAL SUPPLY — 81 items
BAG DECANTER FOR FLEXI CONT (MISCELLANEOUS) IMPLANT
BENZOIN TINCTURE PRP APPL 2/3 (GAUZE/BANDAGES/DRESSINGS) IMPLANT
BLADE CLIPPER SURG (BLADE) IMPLANT
BLADE DERMATOME SS (BLADE) IMPLANT
BLADE HEX COATED 2.75 (ELECTRODE) ×2 IMPLANT
BLADE SURG 10 STRL SS (BLADE) IMPLANT
BLADE SURG 15 STRL LF DISP TIS (BLADE) ×1 IMPLANT
BLADE SURG 15 STRL SS (BLADE) ×3
BNDG COHESIVE 4X5 TAN STRL (GAUZE/BANDAGES/DRESSINGS) IMPLANT
BNDG ELASTIC 3X5.8 VLCR STR LF (GAUZE/BANDAGES/DRESSINGS) IMPLANT
BNDG ELASTIC 4X5.8 VLCR STR LF (GAUZE/BANDAGES/DRESSINGS) ×2 IMPLANT
BNDG ELASTIC 6X5.8 VLCR STR LF (GAUZE/BANDAGES/DRESSINGS) IMPLANT
BNDG GAUZE ELAST 4 BULKY (GAUZE/BANDAGES/DRESSINGS) IMPLANT
CANISTER SUCT 1200ML W/VALVE (MISCELLANEOUS) IMPLANT
CLOSURE WOUND 1/2 X4 (GAUZE/BANDAGES/DRESSINGS)
COTTONBALL LRG STERILE PKG (GAUZE/BANDAGES/DRESSINGS) ×6 IMPLANT
COVER BACK TABLE REUSABLE LG (DRAPES) ×3 IMPLANT
COVER MAYO STAND REUSABLE (DRAPES) ×3 IMPLANT
COVER WAND RF STERILE (DRAPES) IMPLANT
DECANTER SPIKE VIAL GLASS SM (MISCELLANEOUS) IMPLANT
DERMABOND ADVANCED (GAUZE/BANDAGES/DRESSINGS)
DERMABOND ADVANCED .7 DNX12 (GAUZE/BANDAGES/DRESSINGS) IMPLANT
DERMACARRIERS GRAFT 1 TO 1.5 (DISPOSABLE)
DRAPE EXTREMITY T 121X128X90 (DISPOSABLE) IMPLANT
DRAPE HALF SHEET 70X43 (DRAPES) IMPLANT
DRAPE INCISE IOBAN 66X45 STRL (DRAPES) IMPLANT
DRAPE LAPAROTOMY 100X72 PEDS (DRAPES) IMPLANT
DRAPE SURG 17X23 STRL (DRAPES) ×4 IMPLANT
DRAPE U-SHAPE 76X120 STRL (DRAPES) ×2 IMPLANT
DRSG ADAPTIC 3X8 NADH LF (GAUZE/BANDAGES/DRESSINGS) IMPLANT
DRSG EMULSION OIL 3X3 NADH (GAUZE/BANDAGES/DRESSINGS) ×3 IMPLANT
DRSG OPSITE 6X11 MED (GAUZE/BANDAGES/DRESSINGS) IMPLANT
DRSG PAD ABDOMINAL 8X10 ST (GAUZE/BANDAGES/DRESSINGS) IMPLANT
DRSG TEGADERM 4X10 (GAUZE/BANDAGES/DRESSINGS) IMPLANT
ELECT REM PT RETURN 9FT ADLT (ELECTROSURGICAL) ×3
ELECTRODE REM PT RTRN 9FT ADLT (ELECTROSURGICAL) IMPLANT
GAUZE 4X4 16PLY RFD (DISPOSABLE) IMPLANT
GAUZE SPONGE 4X4 12PLY STRL (GAUZE/BANDAGES/DRESSINGS) ×6 IMPLANT
GOWN STRL REUS W/ TWL LRG LVL3 (GOWN DISPOSABLE) ×1 IMPLANT
GOWN STRL REUS W/TWL LRG LVL3 (GOWN DISPOSABLE) ×3
GRAFT DERMACARRIERS 1 TO 1.5 (DISPOSABLE) IMPLANT
NDL PRECISIONGLIDE 27X1.5 (NEEDLE) ×1 IMPLANT
NEEDLE PRECISIONGLIDE 27X1.5 (NEEDLE) ×3 IMPLANT
NS IRRIG 1000ML POUR BTL (IV SOLUTION) ×3 IMPLANT
PACK BASIN DAY SURGERY FS (CUSTOM PROCEDURE TRAY) ×3 IMPLANT
PAD CAST 3X4 CTTN HI CHSV (CAST SUPPLIES) IMPLANT
PAD CAST 4YDX4 CTTN HI CHSV (CAST SUPPLIES) IMPLANT
PADDING CAST ABS 4INX4YD NS (CAST SUPPLIES)
PADDING CAST ABS COTTON 4X4 ST (CAST SUPPLIES) IMPLANT
PADDING CAST COTTON 3X4 STRL (CAST SUPPLIES)
PADDING CAST COTTON 4X4 STRL (CAST SUPPLIES) ×3
PENCIL BUTTON HOLSTER BLD 10FT (ELECTRODE) ×2 IMPLANT
SPONGE LAP 18X18 RF (DISPOSABLE) ×3 IMPLANT
STAPLER VISISTAT 35W (STAPLE) IMPLANT
STOCKINETTE 4X48 STRL (DRAPES) IMPLANT
STOCKINETTE 6  STRL (DRAPES)
STOCKINETTE 6 STRL (DRAPES) IMPLANT
STOCKINETTE IMPERVIOUS LG (DRAPES) IMPLANT
STRIP CLOSURE SKIN 1/2X4 (GAUZE/BANDAGES/DRESSINGS) IMPLANT
SUCTION FRAZIER HANDLE 10FR (MISCELLANEOUS)
SUCTION TUBE FRAZIER 10FR DISP (MISCELLANEOUS) IMPLANT
SURGILUBE 2OZ TUBE FLIPTOP (MISCELLANEOUS) IMPLANT
SUT CHROMIC 5 0 P 3 (SUTURE) ×2 IMPLANT
SUT ETHILON 4 0 P 3 18 (SUTURE) ×4 IMPLANT
SUT MON AB 4-0 PC3 18 (SUTURE) ×2 IMPLANT
SUT MON AB 5-0 PS2 18 (SUTURE) ×2 IMPLANT
SUT PROLENE 4 0 P 3 18 (SUTURE) ×2 IMPLANT
SUT SILK 3 0 SH CR/8 (SUTURE) IMPLANT
SUT SILK 4 0 PS 2 (SUTURE) IMPLANT
SUT VIC AB 3-0 FS2 27 (SUTURE) IMPLANT
SUT VIC AB 5-0 P-3 18X BRD (SUTURE) IMPLANT
SUT VIC AB 5-0 P3 18 (SUTURE)
SUT VICRYL 4-0 PS2 18IN ABS (SUTURE) IMPLANT
SYR BULB 3OZ (MISCELLANEOUS) ×3 IMPLANT
SYR CONTROL 10ML LL (SYRINGE) ×3 IMPLANT
TOWEL GREEN STERILE FF (TOWEL DISPOSABLE) ×3 IMPLANT
TRAY DSU PREP LF (CUSTOM PROCEDURE TRAY) ×3 IMPLANT
TUBE CONNECTING 20'X1/4 (TUBING)
TUBE CONNECTING 20X1/4 (TUBING) IMPLANT
UNDERPAD 30X36 HEAVY ABSORB (UNDERPADS AND DIAPERS) IMPLANT
YANKAUER SUCT BULB TIP NO VENT (SUCTIONS) IMPLANT

## 2019-09-12 NOTE — Anesthesia Procedure Notes (Signed)
Procedure Name: Intubation Date/Time: 09/12/2019 11:25 AM Performed by: Collier Bullock, CRNA Pre-anesthesia Checklist: Patient identified, Emergency Drugs available, Suction available and Patient being monitored Patient Re-evaluated:Patient Re-evaluated prior to induction Oxygen Delivery Method: Circle system utilized Preoxygenation: Pre-oxygenation with 100% oxygen Induction Type: IV induction Ventilation: Mask ventilation with difficulty and Oral airway inserted - appropriate to patient size Laryngoscope Size: Mac and 3 Grade View: Grade I Tube type: Oral Tube size: 7.0 mm Number of attempts: 1 Airway Equipment and Method: Stylet Placement Confirmation: ETT inserted through vocal cords under direct vision,  positive ETCO2 and breath sounds checked- equal and bilateral Secured at: 21 cm Tube secured with: Tape Dental Injury: Teeth and Oropharynx as per pre-operative assessment

## 2019-09-12 NOTE — Op Note (Signed)
Operative Note   DATE OF OPERATION: 09/12/2019  SURGICAL DEPARTMENT: Plastic Surgery  PREOPERATIVE DIAGNOSES: Nasal Mohs surgery defect 5 x 3 cm  POSTOPERATIVE DIAGNOSES:  same  PROCEDURE: 1.  Debridement of nasal defect in preparation for skin graft totaling 5 x 3 cm 2.  Full-thickness skin graft of the nose totaling 5 x 3 cm  SURGEON: Talmadge Coventry, MD  ASSISTANT: None  ANESTHESIA:  General.   COMPLICATIONS: None.   INDICATIONS FOR PROCEDURE:  The patient, [name] is a 68 y.o. female born on 08-14-1951, is here for treatment of nasal defect after Mohs excision of a basal cell carcinoma. MRN: [id number]  CONSENT:  Informed consent was obtained directly from the patient. Risks, benefits and alternatives were fully discussed. Specific risks including but not limited to bleeding, infection, hematoma, seroma, scarring, pain, infection, contracture, asymmetry, wound healing problems, and need for further surgery were all discussed. The patient did have an ample opportunity to have questions answered to satisfaction.  We discussed full-thickness skin graft and the potential for partial total graft loss.  DESCRIPTION OF PROCEDURE:  The patient was taken to the operating room. SCDs were placed and Ancef antibiotics were given.  General anesthesia was administered.  The patient's operative site was prepped and draped in a sterile fashion. A time out was performed and all information was confirmed to be correct.  Marcaine with epinephrine was injected in both the left upper arm and around the nasal defect.  The nasal defect was debrided with a 15 blade.  The scarred skin around the border was excised about a millimeter from the wound edge.  Full-thickness skin graft was harvested from the inner aspect of the left upper arm.  This was done with a 15 blade.  Donor site was closed with interrupted buried 4-0 Monocryl sutures and a running 4-0 Prolene.  Skin graft was then defatted and inset  with interrupted 5-0 chromic sutures.  Xeroform and 4-0 nylon were then used to form a bolster.  The patient tolerated the procedure well.  There were no complications. The patient was allowed to wake from anesthesia, extubated and taken to the recovery room in satisfactory condition.

## 2019-09-12 NOTE — Brief Op Note (Signed)
09/12/2019  12:32 PM  PATIENT:  [name]  68 y.o. female  PRE-OPERATIVE DIAGNOSIS:  Basal cell carcinoma of nose  POST-OPERATIVE DIAGNOSIS:  Basal cell carcinoma of nose  PROCEDURE:  Procedure(s) with comments: debridement and FTSG to the nose (N/A) - 2 hours, please  SURGEON:  Surgeon(s) and Role:    * Luigi Stuckey, Steffanie Dunn, MD - Primary  PHYSICIAN ASSISTANT:   ASSISTANTS: none   ANESTHESIA:   general  EBL:  10cc   BLOOD ADMINISTERED:none  DRAINS: none   LOCAL MEDICATIONS USED:  MARCAINE     SPECIMEN:  No Specimen  DISPOSITION OF SPECIMEN:  N/A  COUNTS:  YES  TOURNIQUET:  * No tourniquets in log *  DICTATION: .Dragon Dictation  PLAN OF CARE: Discharge to home after PACU  PATIENT DISPOSITION:  PACU - hemodynamically stable.   Delay start of Pharmacological VTE agent (>24hrs) due to surgical blood loss or risk of bleeding: not applicable

## 2019-09-12 NOTE — Discharge Instructions (Signed)
Activity (include date of return to work if known) NO driving No heavy activities  Sleep with head of bed slightly elevated  Diet:regular No restrictions:  Wound Care: Keep nose dressing clean & dry.  The bolster will stay on until your clinic visit in 1 week.  The outer gauze on the nose can be changed as needed.  The arm dressing can be removed in 2 days and does not need to be reapplied.  You may reapply a wrap for comfort if desired.  Special Instructions: Call Doctor if any unusual problems occur such as pain, excessive Bleeding, unrelieved Nausea/vomiting, Fever &/or chills When lying down, keep head elevated on 2-3 pillows or back-rest Follow-up appointment: Scheduled for 1 week    Post Anesthesia Home Care Instructions  Activity: Get plenty of rest for the remainder of the day. A responsible individual must stay with you for 24 hours following the procedure.  For the next 24 hours, DO NOT: -Drive a car -Paediatric nurse -Drink alcoholic beverages -Take any medication unless instructed by your physician -Make any legal decisions or sign important papers.  Meals: Start with liquid foods such as gelatin or soup. Progress to regular foods as tolerated. Avoid greasy, spicy, heavy foods. If nausea and/or vomiting occur, drink only clear liquids until the nausea and/or vomiting subsides. Call your physician if vomiting continues.  Special Instructions/Symptoms: Your throat may feel dry or sore from the anesthesia or the breathing tube placed in your throat during surgery. If this causes discomfort, gargle with warm salt water. The discomfort should disappear within 24 hours.  If you had a scopolamine patch placed behind your ear for the management of post- operative nausea and/or vomiting:  1. The medication in the patch is effective for 72 hours, after which it should be removed.  Wrap patch in a tissue and discard in the trash. Wash hands thoroughly with soap and water. 2.  You may remove the patch earlier than 72 hours if you experience unpleasant side effects which may include dry mouth, dizziness or visual disturbances. 3. Avoid touching the patch. Wash your hands with soap and water after contact with the patch.

## 2019-09-12 NOTE — Anesthesia Postprocedure Evaluation (Signed)
Anesthesia Post Note  Patient: Annetta Maw  Procedure(s) Performed: debridement and FTSG to the nose from left upper arm (N/A Nose)     Patient location during evaluation: PACU Anesthesia Type: General Level of consciousness: awake and alert Pain management: pain level controlled Vital Signs Assessment: post-procedure vital signs reviewed and stable Respiratory status: spontaneous breathing, nonlabored ventilation, respiratory function stable and patient connected to nasal cannula oxygen Cardiovascular status: blood pressure returned to baseline and stable Postop Assessment: no apparent nausea or vomiting Anesthetic complications: no    Last Vitals:  Vitals:   09/12/19 1315 09/12/19 1330  BP: (!) 104/56 (!) 114/59  Pulse: 78 73  Resp: 17 17  Temp:    SpO2: 93% 99%    Last Pain:  Vitals:   09/12/19 1315  TempSrc:   PainSc: 3                  Montez Hageman

## 2019-09-12 NOTE — Transfer of Care (Signed)
Immediate Anesthesia Transfer of Care Note  Patient: [name]  Procedure(s) Performed: debridement and FTSG to the nose from left upper arm (N/A Nose)  Patient Location: PACU  Anesthesia Type:General  Level of Consciousness: awake and alert   Airway & Oxygen Therapy: Patient Spontanous Breathing and Patient connected to face mask oxygen  Post-op Assessment: Report given to RN, Post -op Vital signs reviewed and stable, Patient moving all extremities X 4 and Patient able to stick tongue midline  Post vital signs: Reviewed and stable  Last Vitals:  Vitals Value Taken Time  BP 97/44 09/12/19 1238  Temp    Pulse 85 09/12/19 1240  Resp 23 09/12/19 1240  SpO2 95 % 09/12/19 1240  Vitals shown include unvalidated device data.  Last Pain:  Vitals:   09/12/19 0924  TempSrc: Oral  PainSc: 0-No pain      Patients Stated Pain Goal: 8 ([id number])  Complications: No apparent anesthesia complications

## 2019-09-12 NOTE — Interval H&P Note (Signed)
History and Physical Interval Note:  09/12/2019 11:10 AM  [name]  has presented today for surgery, with the diagnosis of Basal cell carcinoma of nose.  The various methods of treatment have been discussed with the patient and family. After consideration of risks, benefits and other options for treatment, the patient has consented to  Procedure(s) with comments: debridement and FTSG to the nose (N/A) - 2 hours, please as a surgical intervention.  The patient's history has been reviewed, patient examined, no change in status, stable for surgery.  I have reviewed the patient's chart and labs.  Questions were answered to the patient's satisfaction.     Cindra Presume

## 2019-09-12 NOTE — Anesthesia Preprocedure Evaluation (Signed)
Anesthesia Evaluation  Patient identified by MRN, date of birth, ID band Patient awake    Reviewed: Allergy & Precautions, NPO status , Patient's Chart, lab work & pertinent test results  Airway Mallampati: II  TM Distance: >3 FB Neck ROM: Full    Dental no notable dental hx.    Pulmonary neg pulmonary ROS,    Pulmonary exam normal breath sounds clear to auscultation       Cardiovascular negative cardio ROS Normal cardiovascular exam Rhythm:Regular Rate:Normal     Neuro/Psych negative neurological ROS  negative psych ROS   GI/Hepatic negative GI ROS, Portal htn   Endo/Other  Hypothyroidism Morbid obesity  Renal/GU negative Renal ROS  negative genitourinary   Musculoskeletal negative musculoskeletal ROS (+)   Abdominal   Peds negative pediatric ROS (+)  Hematology negative hematology ROS (+)   Anesthesia Other Findings   Reproductive/Obstetrics negative OB ROS                             Anesthesia Physical Anesthesia Plan  ASA: III  Anesthesia Plan: General   Post-op Pain Management:    Induction: Intravenous  PONV Risk Score and Plan: 3 and Ondansetron, Dexamethasone, Midazolam and Treatment may vary due to age or medical condition  Airway Management Planned: Oral ETT  Additional Equipment:   Intra-op Plan:   Post-operative Plan: Extubation in OR  Informed Consent: I have reviewed the patients History and Physical, chart, labs and discussed the procedure including the risks, benefits and alternatives for the proposed anesthesia with the patient or authorized representative who has indicated his/her understanding and acceptance.     Dental advisory given  Plan Discussed with: CRNA  Anesthesia Plan Comments:         Anesthesia Quick Evaluation

## 2019-09-13 ENCOUNTER — Encounter (HOSPITAL_BASED_OUTPATIENT_CLINIC_OR_DEPARTMENT_OTHER): Payer: Self-pay | Admitting: Plastic Surgery

## 2019-09-20 ENCOUNTER — Other Ambulatory Visit: Payer: Self-pay

## 2019-09-20 ENCOUNTER — Ambulatory Visit (INDEPENDENT_AMBULATORY_CARE_PROVIDER_SITE_OTHER): Payer: Medicare Other | Admitting: Plastic Surgery

## 2019-09-20 ENCOUNTER — Encounter: Payer: Self-pay | Admitting: Plastic Surgery

## 2019-09-20 VITALS — BP 134/76 | HR 79 | Temp 97.3°F | Ht 64.0 in | Wt 255.4 lb

## 2019-09-20 DIAGNOSIS — C44311 Basal cell carcinoma of skin of nose: Secondary | ICD-10-CM

## 2019-09-20 NOTE — Progress Notes (Signed)
Patient is doing well after full-thickness skin graft to the nose.  Pain is fairly well controlled.  Bolster was removed revealing what looks like a complete take.  Donor site from the left arm looks fine.  I gave her wound care instructions and we will see her in a week to remove the sutures from the left arm.

## 2019-09-27 ENCOUNTER — Encounter: Payer: Self-pay | Admitting: Plastic Surgery

## 2019-09-27 ENCOUNTER — Other Ambulatory Visit: Payer: Self-pay

## 2019-09-27 ENCOUNTER — Ambulatory Visit (INDEPENDENT_AMBULATORY_CARE_PROVIDER_SITE_OTHER): Payer: Medicare Other | Admitting: Plastic Surgery

## 2019-09-27 VITALS — BP 131/75 | HR 83 | Temp 97.3°F | Ht 64.0 in | Wt 254.6 lb

## 2019-09-27 DIAGNOSIS — C44311 Basal cell carcinoma of skin of nose: Secondary | ICD-10-CM

## 2019-09-27 NOTE — Progress Notes (Signed)
Patient is here after full-thickness skin graft to the nose.  This was done about 2 weeks ago.  She is doing great.  The graft looks like a complete take.  The donor site is healing fine and the sutures were removed.  I will plan to see her again in 3 to 4 weeks.

## 2019-11-01 ENCOUNTER — Other Ambulatory Visit: Payer: Self-pay

## 2019-11-01 ENCOUNTER — Encounter: Payer: Self-pay | Admitting: Plastic Surgery

## 2019-11-01 ENCOUNTER — Ambulatory Visit (INDEPENDENT_AMBULATORY_CARE_PROVIDER_SITE_OTHER): Payer: Medicare Other | Admitting: Plastic Surgery

## 2019-11-01 VITALS — BP 144/78 | HR 82 | Temp 97.3°F | Ht 64.0 in | Wt 256.2 lb

## 2019-11-01 DIAGNOSIS — C44311 Basal cell carcinoma of skin of nose: Secondary | ICD-10-CM

## 2019-11-01 NOTE — Progress Notes (Signed)
Patient presents postop from full-thickness skin graft to the nose.  She is doing well at this point.  The graft is a total take and the color and edges are blending nicely.  She does not have any complaints at this point.  I expect this will go on to continue to mature and bland then with the surrounding skin.  I plan to see her again in 3 to 4 months to check on her progress.

## 2019-12-06 DIAGNOSIS — C44311 Basal cell carcinoma of skin of nose: Secondary | ICD-10-CM | POA: Diagnosis not present

## 2019-12-10 ENCOUNTER — Other Ambulatory Visit: Payer: Self-pay | Admitting: Family Medicine

## 2020-01-24 ENCOUNTER — Ambulatory Visit (INDEPENDENT_AMBULATORY_CARE_PROVIDER_SITE_OTHER): Payer: Medicare Other

## 2020-01-24 ENCOUNTER — Other Ambulatory Visit (INDEPENDENT_AMBULATORY_CARE_PROVIDER_SITE_OTHER): Payer: Medicare Other | Admitting: Family Medicine

## 2020-01-24 DIAGNOSIS — E039 Hypothyroidism, unspecified: Secondary | ICD-10-CM

## 2020-01-24 DIAGNOSIS — Z0000 Encounter for general adult medical examination without abnormal findings: Secondary | ICD-10-CM | POA: Diagnosis not present

## 2020-01-24 DIAGNOSIS — R944 Abnormal results of kidney function studies: Secondary | ICD-10-CM | POA: Diagnosis not present

## 2020-01-24 NOTE — Progress Notes (Signed)
Subjective:   [name] is a 69 y.o. female who presents for Medicare Annual (Subsequent) preventive examination.  Review of Systems: N/A   This visit is being conducted through telemedicine via telephone at the nurse health advisor's home address due to the COVID-19 pandemic. This patient has given me verbal consent via doximity to conduct this visit, patient states they are participating from their home address. Patient and myself are on the telephone call. There is no referral for this visit. Some vital signs may be absent or patient reported.    Patient identification: identified by name, DOB, and current address   Cardiac Risk Factors include: advanced age (>33men, >78 women);hypertension     Objective:     Vitals: There were no vitals taken for this visit.  There is no height or weight on file to calculate BMI.  Advanced Directives 01/24/2020 09/12/2019 09/05/2019 01/09/2015  Does Patient Have a Medical Advance Directive? No No No No  Would patient like information on creating a medical advance directive? No - Patient declined No - Patient declined No - Patient declined Yes - Scientist, clinical (histocompatibility and immunogenetics) given    Tobacco Social History   Tobacco Use  Smoking Status Never Smoker  Smokeless Tobacco Never Used     Counseling given: Not Answered   Clinical Intake:  Pre-visit preparation completed: Yes  Pain : No/denies pain     Nutritional Status: BMI 25 -29 Overweight Nutritional Risks: None Diabetes: No  How often do you need to have someone help you when you read instructions, pamphlets, or other written materials from your doctor or pharmacy?: 1 - Never What is the last grade level you completed in school?: 1st year college  Interpreter Needed?: No  Information entered by :: CJohnson, LPN  Past Medical History:  Diagnosis Date  . Cancer ([place])    cecum  . Elevated liver function tests   . Esophageal varices ([place])   . Hypothyroidism   . Iron deficiency  anemia   . Liver disease    chemotherapy complication, per pt, shunts placed to bypass liver  . Malignant neoplasm of cecum ([place])   . Portal hypertension ([place])   . Splenomegaly    Past Surgical History:  Procedure Laterality Date  . ESOPHAGEAL VARICE LIGATION    . HEMICOLECTOMY  01/08/03  . LIVER SURGERY     shunts placed after chemo complication  . SKIN FULL THICKNESS GRAFT N/A 09/12/2019   Procedure: debridement and FTSG to the nose from left upper arm;  Surgeon: Cindra Presume, MD;  Location: [facility];  Service: Plastics;  Laterality: N/A;  2 hours, please   Family History  Problem Relation Age of Onset  . Arthritis Mother   . Hearing loss Mother   . Heart disease Mother   . Hypertension Mother   . Arthritis Father   . Diabetes Father   . Heart disease Father   . Breast cancer Neg Hx    Social History   Socioeconomic History  . Marital status: Married    Spouse name: Not on file  . Number of children: 2  . Years of education: Not on file  . Highest education level: Not on file  Occupational History  . Occupation: Librarian  Tobacco Use  . Smoking status: Never Smoker  . Smokeless tobacco: Never Used  Substance and Sexual Activity  . Alcohol use: Never  . Drug use: Never  . Sexual activity: Not Currently    Partners: Male  Other Topics Concern  . Not on file  Social History Narrative  . Not on file   Social Determinants of Health   Financial Resource Strain: Low Risk   . Difficulty of Paying Living Expenses: Not hard at all  Food Insecurity: No Food Insecurity  . Worried About Charity fundraiser in the Last Year: Never true  . Ran Out of Food in the Last Year: Never true  Transportation Needs: No Transportation Needs  . Lack of Transportation (Medical): No  . Lack of Transportation (Non-Medical): No  Physical Activity: Inactive  . Days of Exercise per Week: 0 days  . Minutes of Exercise per Session: 0 min  Stress: No Stress Concern  Present  . Feeling of Stress : Not at all  Social Connections:   . Frequency of Communication with Friends and Family: Not on file  . Frequency of Social Gatherings with Friends and Family: Not on file  . Attends Religious Services: Not on file  . Active Member of Clubs or Organizations: Not on file  . Attends Archivist Meetings: Not on file  . Marital Status: Not on file    Outpatient Encounter Medications as of 01/24/2020  Medication Sig  . ELDERBERRY PO Take 1,200 mg by mouth. Take 1 tablespoon by mouth daily   . glucosamine-chondroitin 500-400 MG tablet Take 1,500 tablets by mouth 2 (two) times daily.   . Multiple Vitamin (MULTIVITAMIN) capsule Take 1 capsule by mouth daily.  Marland Kitchen omega-3 acid ethyl esters (LOVAZA) 1 g capsule Take 1,200 mg by mouth daily.   Marland Kitchen SYNTHROID 75 MCG tablet TAKE 1 TABLET (75 MCG TOTAL) BY MOUTH DAILY BEFORE BREAKFAST.  Marland Kitchen TURMERIC PO Take 1,000 tablets by mouth daily.   Marland Kitchen VITAMIN A PO Take 2,400 mcg by mouth daily.   . vitamin C (ASCORBIC ACID) 500 MG tablet Take by mouth daily.   Marland Kitchen ZINC OXIDE PO Take 50 mg by mouth.    No facility-administered encounter medications on file as of 01/24/2020.    Activities of Daily Living In your present state of health, do you have any difficulty performing the following activities: 01/24/2020 09/12/2019  Hearing? N N  Vision? N N  Difficulty concentrating or making decisions? N N  Walking or climbing stairs? N N  Dressing or bathing? N N  Doing errands, shopping? N -  Preparing Food and eating ? N -  Using the Toilet? N -  In the past six months, have you accidently leaked urine? N -  Do you have problems with loss of bowel control? N -  Managing your Medications? N -  Managing your Finances? N -  Housekeeping or managing your Housekeeping? N -  Some recent data might be hidden    Patient Care Team: Elby Beck, FNP as PCP - General (Nurse Practitioner)    Assessment:   This is a routine wellness  examination for [facility].  Exercise Activities and Dietary recommendations Current Exercise Habits: The patient does not participate in regular exercise at present, Exercise limited by: None identified  Goals    . Patient Stated     01/24/2020, I will maintain and continue and medications as prescribed.       Fall Risk Fall Risk  01/24/2020 01/18/2019 09/14/2018  Falls in the past year? 0 0 No  Number falls in past yr: 0 - -  Injury with Fall? 0 - -  Risk for fall due to : No Fall Risks - -  Follow up Falls evaluation completed;Falls prevention discussed - -   Is the patient's home free of loose throw rugs in walkways, pet beds, electrical cords, etc?   yes      Grab bars in the bathroom? yes      Handrails on the stairs?   yes      Adequate lighting?   yes  Timed Get Up and Go performed: N/A  Depression Screen PHQ 2/9 Scores 01/24/2020 01/18/2019 09/14/2018  PHQ - 2 Score 0 0 0  PHQ- 9 Score 0 - -     Cognitive Function MMSE - Mini Mental State Exam 01/24/2020  Orientation to time 5  Orientation to Place 5  Registration 3  Attention/ Calculation 5  Recall 3  Language- repeat 1       Mini Cog  Mini-Cog screen was completed. Maximum score is 22. A value of 0 denotes this part of the MMSE was not completed or the patient failed this part of the Mini-Cog screening.   There is no immunization history on file for this patient.  Qualifies for Shingles Vaccine?declined  Screening Tests Health Maintenance  Topic Date Due  . INFLUENZA VACCINE  02/21/2020 (Originally 06/24/2019)  . TETANUS/TDAP  06/29/2020 (Originally 05/29/1970)  . PNA vac Low Risk Adult (1 of 2 - PCV13) 06/29/2020 (Originally 05/29/2016)  . COLONOSCOPY  06/25/2020  . MAMMOGRAM  08/15/2021  . DEXA SCAN  Completed  . Hepatitis C Screening  Completed    Cancer Screenings: Lung: Low Dose CT Chest recommended if Age 39-80 years, 30 pack-year currently smoking OR have quit w/in 15years. Patient does not  qualify. Breast:  Up to date on Mammogram? Yes, completed 08/16/2019   Up to date of Bone Density/Dexa? Yes, completed 07/10/2019 Colorectal: completed 06/25/2010  Additional Screenings: Hepatitis C Screening: 05/14/2011     Plan:    Patient will maintain and continue medications as prescribed.   I have personally reviewed and noted the following in the patient's chart:   . Medical and social history . Use of alcohol, tobacco or illicit drugs  . Current medications and supplements . Functional ability and status . Nutritional status . Physical activity . Advanced directives . List of other physicians . Hospitalizations, surgeries, and ER visits in previous 12 months . Vitals . Screenings to include cognitive, depression, and falls . Referrals and appointments  In addition, I have reviewed and discussed with patient certain preventive protocols, quality metrics, and best practice recommendations. A written personalized care plan for preventive services as well as general preventive health recommendations were provided to patient.     Andrez Grime, LPN  [phone]

## 2020-01-24 NOTE — Patient Instructions (Signed)
[name] , Thank you for taking time to come for your Medicare Wellness Visit. I appreciate your ongoing commitment to your health goals. Please review the following plan we discussed and let me know if I can assist you in the future.   Screening recommendations/referrals: Colonoscopy: Up to date, completed 06/25/2010 Mammogram: Up to date, completed 08/16/2019 Bone Density: Up to date, completed 07/10/2019 Recommended yearly ophthalmology/optometry visit for glaucoma screening and checkup Recommended yearly dental visit for hygiene and checkup  Vaccinations: Influenza vaccine: declined Pneumococcal vaccine: declined Tdap vaccine: declined Shingles vaccine: declined    Advanced directives: Advance directive discussed with you today. Even though you declined this today please call our office should you change your mind and we can give you the proper paperwork for you to fill out.  Conditions/risks identified: hypertension  Next appointment: 01/26/2020 @ 3:30 pm    Preventive Care 65 Years and Older, Female Preventive care refers to lifestyle choices and visits with your health care provider that can promote health and wellness. What does preventive care include?  A yearly physical exam. This is also called an annual well check.  Dental exams once or twice a year.  Routine eye exams. Ask your health care provider how often you should have your eyes checked.  Personal lifestyle choices, including:  Daily care of your teeth and gums.  Regular physical activity.  Eating a healthy diet.  Avoiding tobacco and drug use.  Limiting alcohol use.  Practicing safe sex.  Taking low-dose aspirin every day.  Taking vitamin and mineral supplements as recommended by your health care provider. What happens during an annual well check? The services and screenings done by your health care provider during your annual well check will depend on your age, overall health, lifestyle risk factors,  and family history of disease. Counseling  Your health care provider may ask you questions about your:  Alcohol use.  Tobacco use.  Drug use.  Emotional well-being.  Home and relationship well-being.  Sexual activity.  Eating habits.  History of falls.  Memory and ability to understand (cognition).  Work and work Statistician.  Reproductive health. Screening  You may have the following tests or measurements:  Height, weight, and BMI.  Blood pressure.  Lipid and cholesterol levels. These may be checked every 5 years, or more frequently if you are over 44 years old.  Skin check.  Lung cancer screening. You may have this screening every year starting at age 50 if you have a 30-pack-year history of smoking and currently smoke or have quit within the past 15 years.  Fecal occult blood test (FOBT) of the stool. You may have this test every year starting at age 73.  Flexible sigmoidoscopy or colonoscopy. You may have a sigmoidoscopy every 5 years or a colonoscopy every 10 years starting at age 58.  Hepatitis C blood test.  Hepatitis B blood test.  Sexually transmitted disease (STD) testing.  Diabetes screening. This is done by checking your blood sugar (glucose) after you have not eaten for a while (fasting). You may have this done every 1-3 years.  Bone density scan. This is done to screen for osteoporosis. You may have this done starting at age 55.  Mammogram. This may be done every 1-2 years. Talk to your health care provider about how often you should have regular mammograms. Talk with your health care provider about your test results, treatment options, and if necessary, the need for more tests. Vaccines  Your health care provider  may recommend certain vaccines, such as:  Influenza vaccine. This is recommended every year.  Tetanus, diphtheria, and acellular pertussis (Tdap, Td) vaccine. You may need a Td booster every 10 years.  Zoster vaccine. You may need  this after age 24.  Pneumococcal 13-valent conjugate (PCV13) vaccine. One dose is recommended after age 61.  Pneumococcal polysaccharide (PPSV23) vaccine. One dose is recommended after age 67. Talk to your health care provider about which screenings and vaccines you need and how often you need them. This information is not intended to replace advice given to you by your health care provider. Make sure you discuss any questions you have with your health care provider. Document Released: 12/06/2015 Document Revised: 07/29/2016 Document Reviewed: 09/10/2015 Elsevier Interactive Patient Education  2017 [employer] Prevention in the Home Falls can cause injuries. They can happen to people of all ages. There are many things you can do to make your home safe and to help prevent falls. What can I do on the outside of my home?  Regularly fix the edges of walkways and driveways and fix any cracks.  Remove anything that might make you trip as you walk through a door, such as a raised step or threshold.  Trim any bushes or trees on the path to your home.  Use bright outdoor lighting.  Clear any walking paths of anything that might make someone trip, such as rocks or tools.  Regularly check to see if handrails are loose or broken. Make sure that both sides of any steps have handrails.  Any raised decks and porches should have guardrails on the edges.  Have any leaves, snow, or ice cleared regularly.  Use sand or salt on walking paths during winter.  Clean up any spills in your garage right away. This includes oil or grease spills. What can I do in the bathroom?  Use night lights.  Install grab bars by the toilet and in the tub and shower. Do not use towel bars as grab bars.  Use non-skid mats or decals in the tub or shower.  If you need to sit down in the shower, use a plastic, non-slip stool.  Keep the floor dry. Clean up any water that spills on the floor as soon as it  happens.  Remove soap buildup in the tub or shower regularly.  Attach bath mats securely with double-sided non-slip rug tape.  Do not have throw rugs and other things on the floor that can make you trip. What can I do in the bedroom?  Use night lights.  Make sure that you have a light by your bed that is easy to reach.  Do not use any sheets or blankets that are too big for your bed. They should not hang down onto the floor.  Have a firm chair that has side arms. You can use this for support while you get dressed.  Do not have throw rugs and other things on the floor that can make you trip. What can I do in the kitchen?  Clean up any spills right away.  Avoid walking on wet floors.  Keep items that you use a lot in easy-to-reach places.  If you need to reach something above you, use a strong step stool that has a grab bar.  Keep electrical cords out of the way.  Do not use floor polish or wax that makes floors slippery. If you must use wax, use non-skid floor wax.  Do not have throw rugs  and other things on the floor that can make you trip. What can I do with my stairs?  Do not leave any items on the stairs.  Make sure that there are handrails on both sides of the stairs and use them. Fix handrails that are broken or loose. Make sure that handrails are as long as the stairways.  Check any carpeting to make sure that it is firmly attached to the stairs. Fix any carpet that is loose or worn.  Avoid having throw rugs at the top or bottom of the stairs. If you do have throw rugs, attach them to the floor with carpet tape.  Make sure that you have a light switch at the top of the stairs and the bottom of the stairs. If you do not have them, ask someone to add them for you. What else can I do to help prevent falls?  Wear shoes that:  Do not have high heels.  Have rubber bottoms.  Are comfortable and fit you well.  Are closed at the toe. Do not wear sandals.  If you  use a stepladder:  Make sure that it is fully opened. Do not climb a closed stepladder.  Make sure that both sides of the stepladder are locked into place.  Ask someone to hold it for you, if possible.  Clearly mark and make sure that you can see:  Any grab bars or handrails.  First and last steps.  Where the edge of each step is.  Use tools that help you move around (mobility aids) if they are needed. These include:  Canes.  Walkers.  Scooters.  Crutches.  Turn on the lights when you go into a dark area. Replace any light bulbs as soon as they burn out.  Set up your furniture so you have a clear path. Avoid moving your furniture around.  If any of your floors are uneven, fix them.  If there are any pets around you, be aware of where they are.  Review your medicines with your doctor. Some medicines can make you feel dizzy. This can increase your chance of falling. Ask your doctor what other things that you can do to help prevent falls. This information is not intended to replace advice given to you by your health care provider. Make sure you discuss any questions you have with your health care provider. Document Released: 09/05/2009 Document Revised: 04/16/2016 Document Reviewed: 12/14/2014 Elsevier Interactive Patient Education  2017 [employer].

## 2020-01-24 NOTE — Progress Notes (Signed)
PCP notes:  Health Maintenance: Declined all vaccines   Abnormal Screenings: none   Patient concerns: Discuss thyroid medication   Nurse concerns: none   Next PCP appt.: 01/26/2020 @ 3:30 pm

## 2020-01-25 LAB — CBC WITH DIFFERENTIAL/PLATELET
Basophils Absolute: 0 10*3/uL (ref 0.0–0.1)
Basophils Relative: 0.8 % (ref 0.0–3.0)
Eosinophils Absolute: 0.2 10*3/uL (ref 0.0–0.7)
Eosinophils Relative: 5.5 % — ABNORMAL HIGH (ref 0.0–5.0)
HCT: 37.4 % (ref 36.0–46.0)
Hemoglobin: 12.7 g/dL (ref 12.0–15.0)
Lymphocytes Relative: 30 % (ref 12.0–46.0)
Lymphs Abs: 1.1 10*3/uL (ref 0.7–4.0)
MCHC: 34 g/dL (ref 30.0–36.0)
MCV: 95 fl (ref 78.0–100.0)
Monocytes Absolute: 0.4 10*3/uL (ref 0.1–1.0)
Monocytes Relative: 12 % (ref 3.0–12.0)
Neutro Abs: 1.9 10*3/uL (ref 1.4–7.7)
Neutrophils Relative %: 51.7 % (ref 43.0–77.0)
Platelets: 177 10*3/uL (ref 150.0–400.0)
RBC: 3.93 Mil/uL (ref 3.87–5.11)
RDW: 14.7 % (ref 11.5–15.5)
WBC: 3.7 10*3/uL — ABNORMAL LOW (ref 4.0–10.5)

## 2020-01-25 LAB — TSH: TSH: 3.72 u[IU]/mL (ref 0.35–4.50)

## 2020-01-25 LAB — BASIC METABOLIC PANEL
BUN: 13 mg/dL (ref 6–23)
CO2: 30 mEq/L (ref 19–32)
Calcium: 9.4 mg/dL (ref 8.4–10.5)
Chloride: 103 mEq/L (ref 96–112)
Creatinine, Ser: 0.72 mg/dL (ref 0.40–1.20)
GFR: 80.4 mL/min (ref >60.00)
Glucose, Bld: 75 mg/dL (ref 70–99)
Potassium: 4.2 mEq/L (ref 3.5–5.1)
Sodium: 138 mEq/L (ref 135–145)

## 2020-01-26 ENCOUNTER — Ambulatory Visit (INDEPENDENT_AMBULATORY_CARE_PROVIDER_SITE_OTHER): Payer: Medicare Other | Admitting: Family Medicine

## 2020-01-26 ENCOUNTER — Encounter: Payer: Self-pay | Admitting: Family Medicine

## 2020-01-26 ENCOUNTER — Other Ambulatory Visit: Payer: Self-pay

## 2020-01-26 VITALS — BP 128/82 | HR 82 | Temp 98.2°F | Ht 64.0 in | Wt 248.8 lb

## 2020-01-26 DIAGNOSIS — E039 Hypothyroidism, unspecified: Secondary | ICD-10-CM

## 2020-01-26 DIAGNOSIS — M25561 Pain in right knee: Secondary | ICD-10-CM | POA: Diagnosis not present

## 2020-01-26 DIAGNOSIS — G250 Essential tremor: Secondary | ICD-10-CM

## 2020-01-26 DIAGNOSIS — Z0000 Encounter for general adult medical examination without abnormal findings: Secondary | ICD-10-CM

## 2020-01-26 DIAGNOSIS — C189 Malignant neoplasm of colon, unspecified: Secondary | ICD-10-CM

## 2020-01-26 DIAGNOSIS — C44311 Basal cell carcinoma of skin of nose: Secondary | ICD-10-CM

## 2020-01-26 DIAGNOSIS — M25562 Pain in left knee: Secondary | ICD-10-CM

## 2020-01-26 DIAGNOSIS — G8929 Other chronic pain: Secondary | ICD-10-CM

## 2020-01-26 NOTE — Progress Notes (Signed)
Subjective:    Patient ID: [name], female    DOB: 26-May-1951, 69 y.o.   MRN: [id number]  HPI This is a 69 yo female who presents today for CPE. She is accompanied by her husband.   Last CPE- several years.  Mammo- 08/16/19 Pap- aged out Colonoscopy- 06/25/2010, needs follow up with GI Tdap- declines Flu- declines Eye- regular Dental- not regular Exercise- was walking before weather got cold  Hypothyroidism- TSH 09/14/18- 39.32, currently on synthroid 75 mcg, TSH 3.72. She is concerned that supplementation responsible for leg pain.   Obesity- has lost a little weight. Was walking but stopped due to weather.   Tremor- has noticed in both hands. Worse with holding onto things, worse when fatigued. No family history.   Review of Systems  Constitutional: Negative.   HENT: Negative.   Eyes: Negative.   Respiratory: Negative.   Cardiovascular: Negative.   Gastrointestinal: Negative.   Endocrine: Negative.   Genitourinary: Negative.   Musculoskeletal:       Bilateral knee pain, L>R. Pain in front and back of shins. Seems worse since she stopped walking. Stands for work as Licensed conveyancer.   Skin: Positive for wound (has had skin graft to nose for BCC. Healing well. ).  Allergic/Immunologic: Negative.   Neurological: Negative.   Hematological: Negative.   Psychiatric/Behavioral: Negative.        Objective:   Physical Exam Vitals reviewed. Exam conducted with a chaperone present.  Constitutional:      General: She is not in acute distress.    Appearance: Normal appearance. She is obese. She is not ill-appearing, toxic-appearing or diaphoretic.  HENT:     Head: Normocephalic and atraumatic.     Right Ear: External ear normal.     Left Ear: External ear normal.     Nose:     Comments: Top of nose with skin graft. No erythema, no drainage.     Mouth/Throat:     Mouth: Mucous membranes are moist.     Pharynx: Oropharynx is clear.  Eyes:     Conjunctiva/sclera:  Conjunctivae normal.  Cardiovascular:     Rate and Rhythm: Normal rate and regular rhythm.     Heart sounds: Normal heart sounds.  Pulmonary:     Effort: Pulmonary effort is normal.     Breath sounds: Normal breath sounds.  Chest:     Breasts:        Right: Normal. No swelling, bleeding, inverted nipple, mass, nipple discharge, skin change or tenderness.        Left: Normal. No swelling, bleeding, inverted nipple, mass, nipple discharge, skin change or tenderness.  Abdominal:     General: Abdomen is flat. Bowel sounds are normal.     Palpations: Abdomen is soft.  Musculoskeletal:     Cervical back: Normal range of motion and neck supple.     Right lower leg: No edema.     Left lower leg: No edema.  Lymphadenopathy:     Upper Body:     Right upper body: No supraclavicular, axillary or pectoral adenopathy.     Left upper body: No supraclavicular, axillary or pectoral adenopathy.  Skin:    General: Skin is warm and dry.  Neurological:     Mental Status: She is alert and oriented to person, place, and time.  Psychiatric:        Mood and Affect: Mood normal.        Behavior: Behavior normal.  Thought Content: Thought content normal.        Judgment: Judgment normal.       BP 128/82 (BP Location: Left Arm, Patient Position: Sitting, Cuff Size: Normal)   Pulse 82   Temp 98.2 F (36.8 C) (Temporal)   Ht 5\' 4"  (1.626 m)   Wt 248 lb 12.8 oz (112.9 kg)   SpO2 97%   BMI 42.71 kg/m      Wt Readings from Last 3 Encounters:  01/26/20 248 lb 12.8 oz (112.9 kg)  11/01/19 256 lb 3.2 oz (116.2 kg)  09/27/19 254 lb 9.6 oz (115.5 kg)   Depression screen [facility] 2/9 01/24/2020 01/18/2019 09/14/2018  Decreased Interest 0 0 0  Down, Depressed, Hopeless 0 0 0  PHQ - 2 Score 0 0 0  Altered sleeping 0 - -  Tired, decreased energy 0 - -  Change in appetite 0 - -  Feeling bad or failure about yourself  0 - -  Trouble concentrating 0 - -  Moving slowly or fidgety/restless 0 - -    Suicidal thoughts 0 - -  PHQ-9 Score 0 - -  Difficult doing work/chores Not difficult at all - -     Assessment & Plan:  1. Annual physical exam - Discussed and encouraged healthy lifestyle choices- adequate sleep, regular exercise, stress management and healthy food choices.    2. Basal cell carcinoma of nose - continue follow up with plastic surgery  3. Malignant neoplasm of colon, unspecified part of colon ([place]) - needs follow up with GI, reminded patient  4. Morbid obesity ([place]) - has had some weight loss, ? With normalization of TSH.   5. Chronic pain of both knees - discussed with patient, otc analgesics (she does not like to take meds), increased activity, support  6. Essential tremor - discussed medication (propranolol), patient not interested at this time  - provided information     Clarene Reamer, FNP-BC  [facility], [place]  01/29/2020 8:04 AM

## 2020-01-26 NOTE — Patient Instructions (Signed)
Good to see you today  Increase activity as tolerated, walking is great for your knees, try compression socks, can try slip on knee brace for daytime use  There is a medication called propranolol for tremor.   Essential Tremor A tremor is trembling or shaking that a person cannot control. Most tremors affect the hands or arms. Tremors can also affect the head, vocal cords, legs, and other parts of the body. Essential tremor is a tremor without a known cause. Usually, it occurs while a person is trying to perform an action. It tends to get worse gradually as a person ages. What are the causes? The cause of this condition is not known. What increases the risk? You are more likely to develop this condition if:  You have a family member with essential tremor.  You are age 15 or older.  You take certain medicines. What are the signs or symptoms? The main sign of a tremor is a rhythmic shaking of certain parts of your body that is uncontrolled and unintentional. You may:  Have difficulty eating with a spoon or fork.  Have difficulty writing.  Nod your head up and down or side to side.  Have a quivering voice. The shaking may:  Get worse over time.  Come and go.  Be more noticeable on one side of your body.  Get worse due to stress, fatigue, caffeine, and extreme heat or cold. How is this diagnosed? This condition may be diagnosed based on:  Your symptoms and medical history.  A physical exam. There is no single test to diagnose an essential tremor. However, your health care provider may order tests to rule out other causes of your condition. These may include:  Blood and urine tests.  Imaging studies of your brain, such as CT scan and MRI.  A test that measures involuntary muscle movement (electromyogram). How is this treated? Treatment for essential tremor depends on the severity of the condition.  Some tremors may go away without treatment.  Mild tremors may not  need treatment if they do not affect your day-to-day life.  Severe tremors may need to be treated using one or more of the following options: ? Medicines. ? Lifestyle changes. ? Occupational or physical therapy. Follow these instructions at home: Lifestyle   Do not use any products that contain nicotine or tobacco, such as cigarettes and e-cigarettes. If you need help quitting, ask your health care provider.  Limit your caffeine intake as told by your health care provider.  Try to get 8 hours of sleep each night.  Find ways to manage your stress that fits your lifestyle and personality. Consider trying meditation or yoga.  Try to anticipate stressful situations and allow extra time to manage them.  If you are struggling emotionally with the effects of your tremor, consider working with a mental health provider. General instructions  Take over-the-counter and prescription medicines only as told by your health care provider.  Avoid extreme heat and extreme cold.  Keep all follow-up visits as told by your health care provider. This is important. Visits may include physical therapy visits. Contact a health care provider if:  You experience any changes in the location or intensity of your tremors.  You start having a tremor after starting a new medicine.  You have tremor with other symptoms, such as: ? Numbness. ? Tingling. ? Pain. ? Weakness.  Your tremor gets worse.  Your tremor interferes with your daily life.  You feel down, blue, or  sad for at least 2 weeks in a row.  Worrying about your tremor and what other people think about you interferes with your everyday life functions, including relationships, work, or school. Summary  Essential tremor is a tremor without a known cause. Usually, it occurs when you are trying to perform an action.  The cause of this condition is not known.  The main sign of a tremor is a rhythmic shaking of certain parts of your body that  is uncontrolled and unintentional.  Treatment for essential tremor depends on the severity of the condition. This information is not intended to replace advice given to you by your health care provider. Make sure you discuss any questions you have with your health care provider. Document Revised: 11/19/2017 Document Reviewed: 11/19/2017 Elsevier Patient Education  2020 [employer].

## 2020-01-29 ENCOUNTER — Encounter: Payer: Self-pay | Admitting: Family Medicine

## 2020-01-29 DIAGNOSIS — G8929 Other chronic pain: Secondary | ICD-10-CM | POA: Insufficient documentation

## 2020-01-29 DIAGNOSIS — M25562 Pain in left knee: Secondary | ICD-10-CM | POA: Insufficient documentation

## 2020-02-07 DIAGNOSIS — L718 Other rosacea: Secondary | ICD-10-CM | POA: Diagnosis not present

## 2020-02-07 DIAGNOSIS — C44311 Basal cell carcinoma of skin of nose: Secondary | ICD-10-CM | POA: Diagnosis not present

## 2020-03-06 ENCOUNTER — Encounter: Payer: Self-pay | Admitting: Plastic Surgery

## 2020-03-06 ENCOUNTER — Other Ambulatory Visit: Payer: Self-pay

## 2020-03-06 ENCOUNTER — Ambulatory Visit (INDEPENDENT_AMBULATORY_CARE_PROVIDER_SITE_OTHER): Payer: Medicare Other | Admitting: Plastic Surgery

## 2020-03-06 VITALS — BP 126/74 | HR 75 | Temp 97.3°F | Ht 64.0 in | Wt 250.4 lb

## 2020-03-06 DIAGNOSIS — C44311 Basal cell carcinoma of skin of nose: Secondary | ICD-10-CM | POA: Diagnosis not present

## 2020-03-06 NOTE — Progress Notes (Signed)
   Referring Provider Elby Beck, [place],  [place] [address]   CC:  Chief Complaint  Patient presents with  . Follow-up    4 months for debridement and FTSG to nose      [name] is an 69 y.o. female.  HPI: Patient is here about 6 months out from full-thickness skin graft to the tip of the nose.  It was a fairly large Mohs surgery defect that we allowed to granulate and then covered with a skin graft.  She is currently very happy with the appearance feels like it continues to smooth out and improving color on a daily basis.  She says her donor site in her left upper arm is healed fine.  Review of Systems General: Denies fevers or chills  Physical Exam Vitals with BMI 03/06/2020 01/26/2020 01/24/2020  Height 5\' 4"  5\' 4"  (No Data)  Weight 250 lbs 6 oz 248 lbs 13 oz (No Data)  BMI A999333 0000000 -  Systolic [id number] 0000000 (No Data)  Diastolic 74 82 (No Data)  Pulse 75 82 (No Data)    General:  No acute distress,  Alert and oriented, Non-Toxic, Normal speech and affect On exam she has 100% take of the skin graft with a great color match.  The graft itself is slightly thicker or swollen relative to the surrounding nasal skin but this is fairly mild.  This creates a small step-off at the borders but again it is not terribly noticeable from a conversational distance.  The donor site in her left arm looks fine.  Assessment/Plan Patient doing well after full-thickness skin graft to the nose.  I explained that I would give this even more time prior to considering any revision but she is fairly happy with how it looks now and is unlikely that she will seek that.  For the time being I have asked her to use good sun protection with a hat and sunscreen we will plan to follow-up with her again on an as-needed basis.  Cindra Presume 03/06/2020, 5:13 PM

## 2020-03-11 ENCOUNTER — Encounter (HOSPITAL_COMMUNITY): Payer: Self-pay

## 2020-03-11 ENCOUNTER — Emergency Department (HOSPITAL_COMMUNITY): Payer: Medicare Other

## 2020-03-11 ENCOUNTER — Inpatient Hospital Stay (HOSPITAL_COMMUNITY)
Admission: EM | Admit: 2020-03-11 | Discharge: 2020-03-14 | DRG: 563 | Disposition: A | Discharge status: Home Health | Payer: Medicare Other | Source: Ambulatory Visit | Attending: Family Medicine | Admitting: Family Medicine

## 2020-03-11 ENCOUNTER — Ambulatory Visit (INDEPENDENT_AMBULATORY_CARE_PROVIDER_SITE_OTHER): Payer: Medicare Other

## 2020-03-11 ENCOUNTER — Other Ambulatory Visit: Payer: Self-pay

## 2020-03-11 ENCOUNTER — Ambulatory Visit (INDEPENDENT_AMBULATORY_CARE_PROVIDER_SITE_OTHER)
Admission: EM | Admit: 2020-03-11 | Discharge: 2020-03-11 | Disposition: A | Discharge status: Home / Self Care | Payer: Medicare Other | Source: Home / Self Care

## 2020-03-11 ENCOUNTER — Encounter: Payer: Self-pay | Admitting: Emergency Medicine

## 2020-03-11 DIAGNOSIS — Z85038 Personal history of other malignant neoplasm of large intestine: Secondary | ICD-10-CM | POA: Diagnosis not present

## 2020-03-11 DIAGNOSIS — W1809XA Striking against other object with subsequent fall, initial encounter: Secondary | ICD-10-CM

## 2020-03-11 DIAGNOSIS — Z9049 Acquired absence of other specified parts of digestive tract: Secondary | ICD-10-CM | POA: Diagnosis not present

## 2020-03-11 DIAGNOSIS — Z8249 Family history of ischemic heart disease and other diseases of the circulatory system: Secondary | ICD-10-CM

## 2020-03-11 DIAGNOSIS — W010XXA Fall on same level from slipping, tripping and stumbling without subsequent striking against object, initial encounter: Secondary | ICD-10-CM | POA: Diagnosis present

## 2020-03-11 DIAGNOSIS — Z79899 Other long term (current) drug therapy: Secondary | ICD-10-CM

## 2020-03-11 DIAGNOSIS — R030 Elevated blood-pressure reading, without diagnosis of hypertension: Secondary | ICD-10-CM

## 2020-03-11 DIAGNOSIS — Z9181 History of falling: Secondary | ICD-10-CM

## 2020-03-11 DIAGNOSIS — E039 Hypothyroidism, unspecified: Secondary | ICD-10-CM | POA: Diagnosis not present

## 2020-03-11 DIAGNOSIS — Z8261 Family history of arthritis: Secondary | ICD-10-CM | POA: Diagnosis not present

## 2020-03-11 DIAGNOSIS — Z833 Family history of diabetes mellitus: Secondary | ICD-10-CM | POA: Diagnosis not present

## 2020-03-11 DIAGNOSIS — T451X5A Adverse effect of antineoplastic and immunosuppressive drugs, initial encounter: Secondary | ICD-10-CM | POA: Diagnosis not present

## 2020-03-11 DIAGNOSIS — Y92019 Unspecified place in single-family (private) house as the place of occurrence of the external cause: Secondary | ICD-10-CM

## 2020-03-11 DIAGNOSIS — Z85828 Personal history of other malignant neoplasm of skin: Secondary | ICD-10-CM | POA: Diagnosis not present

## 2020-03-11 DIAGNOSIS — M1990 Unspecified osteoarthritis, unspecified site: Secondary | ICD-10-CM | POA: Diagnosis present

## 2020-03-11 DIAGNOSIS — S82143A Displaced bicondylar fracture of unspecified tibia, initial encounter for closed fracture: Secondary | ICD-10-CM | POA: Diagnosis present

## 2020-03-11 DIAGNOSIS — Z6841 Body Mass Index (BMI) 40.0 and over, adult: Secondary | ICD-10-CM

## 2020-03-11 DIAGNOSIS — S82142A Displaced bicondylar fracture of left tibia, initial encounter for closed fracture: Principal | ICD-10-CM | POA: Diagnosis present

## 2020-03-11 DIAGNOSIS — Z9221 Personal history of antineoplastic chemotherapy: Secondary | ICD-10-CM | POA: Diagnosis not present

## 2020-03-11 DIAGNOSIS — Z20822 Contact with and (suspected) exposure to covid-19: Secondary | ICD-10-CM | POA: Diagnosis present

## 2020-03-11 DIAGNOSIS — W19XXXA Unspecified fall, initial encounter: Secondary | ICD-10-CM | POA: Diagnosis not present

## 2020-03-11 DIAGNOSIS — Z743 Need for continuous supervision: Secondary | ICD-10-CM | POA: Diagnosis not present

## 2020-03-11 DIAGNOSIS — Z91041 Radiographic dye allergy status: Secondary | ICD-10-CM | POA: Diagnosis not present

## 2020-03-11 DIAGNOSIS — M25062 Hemarthrosis, left knee: Secondary | ICD-10-CM | POA: Diagnosis present

## 2020-03-11 DIAGNOSIS — R52 Pain, unspecified: Secondary | ICD-10-CM | POA: Diagnosis not present

## 2020-03-11 DIAGNOSIS — Z03818 Encounter for observation for suspected exposure to other biological agents ruled out: Secondary | ICD-10-CM | POA: Diagnosis not present

## 2020-03-11 DIAGNOSIS — R609 Edema, unspecified: Secondary | ICD-10-CM | POA: Diagnosis not present

## 2020-03-11 DIAGNOSIS — Z79890 Hormone replacement therapy (postmenopausal): Secondary | ICD-10-CM

## 2020-03-11 DIAGNOSIS — S82122A Displaced fracture of lateral condyle of left tibia, initial encounter for closed fracture: Secondary | ICD-10-CM | POA: Diagnosis not present

## 2020-03-11 DIAGNOSIS — R110 Nausea: Secondary | ICD-10-CM | POA: Diagnosis not present

## 2020-03-11 DIAGNOSIS — D509 Iron deficiency anemia, unspecified: Secondary | ICD-10-CM | POA: Diagnosis not present

## 2020-03-11 DIAGNOSIS — M25562 Pain in left knee: Secondary | ICD-10-CM

## 2020-03-11 DIAGNOSIS — K769 Liver disease, unspecified: Secondary | ICD-10-CM | POA: Diagnosis present

## 2020-03-11 MED ORDER — MORPHINE SULFATE (PF) 4 MG/ML IV SOLN
4.0000 mg | Freq: Once | INTRAVENOUS | Status: AC
  Administered 2020-03-11: 4 mg via INTRAVENOUS
  Filled 2020-03-11: qty 1

## 2020-03-11 MED ORDER — ONDANSETRON HCL 4 MG/2ML IJ SOLN: 4.0000 mg | Freq: Once | INTRAMUSCULAR | Status: DC

## 2020-03-11 MED ORDER — PENICILLIN G BENZATHINE 1200000 UNIT/2ML IM SUSP: 1.2000 10*6.[IU] | Freq: Once | INTRAMUSCULAR | Status: DC

## 2020-03-11 NOTE — ED Provider Notes (Signed)
[facility] EMERGENCY DEPARTMENT Provider Note   CSN: [id number] Arrival date & time: 03/11/20  2205     History Chief Complaint  Patient presents with  . Fall    [name] is a 69 y.o. female.  Patient with remote history of CA (2004), obesity, to ED from [facility] where she was evaluated after mechanical fall this afternoon, injuring left knee. Per review of medical chart, plain film imaging show a depressed tibial plateau fracture. She arrives by [facility]. She complains of significant pain at the injured knee. Urgent Care provider discussed the patient with Dr. Marcelino Scot of orthopedics and advised he would provide consultation.   The history is provided by the patient. No language interpreter was used.  Fall       Past Medical History:  Diagnosis Date  . Cancer ([place])    cecum  . Elevated liver function tests   . Esophageal varices ([place])   . Hypothyroidism   . Iron deficiency anemia   . Liver disease    chemotherapy complication, per pt, shunts placed to bypass liver  . Malignant neoplasm of cecum ([place])   . Portal hypertension ([place])   . Splenomegaly     Patient Active Problem List   Diagnosis Date Noted  . Chronic pain of both knees 01/29/2020  . Morbid obesity ([place]) 01/29/2020  . Hypothyroidism 06/30/2019  . Basal cell carcinoma of nose 06/30/2019  . Colon cancer ([place]) 11/30/2011  . Hemorrhage of gastrointestinal tract 05/04/2011  . Esophageal varices ([place]) 06/12/2010  . Portal hypertension ([place]) 01/23/2008    Past Surgical History:  Procedure Laterality Date  . ESOPHAGEAL VARICE LIGATION    . HEMICOLECTOMY  01/08/03  . LIVER SURGERY     shunts placed after chemo complication  . SKIN FULL THICKNESS GRAFT N/A 09/12/2019   Procedure: debridement and FTSG to the nose from left upper arm;  Surgeon: Cindra Presume, MD;  Location: [place];  Service: Plastics;  Laterality: N/A;  2 hours, please     OB History   No  obstetric history on file.     Family History  Problem Relation Age of Onset  . Arthritis Mother   . Hearing loss Mother   . Heart disease Mother   . Hypertension Mother   . Arthritis Father   . Diabetes Father   . Heart disease Father   . Breast cancer Neg Hx     Social History   Tobacco Use  . Smoking status: Never Smoker  . Smokeless tobacco: Never Used  Substance Use Topics  . Alcohol use: Never  . Drug use: Never    Home Medications Prior to Admission medications   Medication Sig Start Date End Date Taking? Authorizing Provider  Cholecalciferol (VITAMIN D3) 50 MCG (2000 UT) TABS Take by mouth.    [provider]  Cyanocobalamin (VITAMIN B-12) 5000 MCG TBDP Take by mouth daily.    [provider]  ELDERBERRY PO Take 1,200 mg by mouth. Take 1 tablespoon by mouth daily     [provider]  fluorouracil (EFUDEX) 5 % cream Apply  a small amount to skin twice a day  Apply to nose twice daily for three weeks 12/06/19   [provider]  Glucosamine-Chondroitin 1500-1200 MG/30ML LIQD daily.     [provider]  Multiple Vitamin (MULTIVITAMIN) capsule Take 1 capsule by mouth daily.    [provider]  omega-3 acid ethyl esters (LOVAZA) 1 g  capsule Take 1,200 mg by mouth daily.     [provider]  SYNTHROID 75 MCG tablet TAKE 1 TABLET (75 MCG TOTAL) BY MOUTH DAILY BEFORE BREAKFAST. 12/11/19   Elby Beck, FNP  TURMERIC PO Take 1,000 tablets by mouth daily.     [provider]  UNABLE TO FIND Med Name: Collagen-Biotin 2000mg     [provider]  VITAMIN A PO Take 2,400 mcg by mouth daily.     [provider]  vitamin C (ASCORBIC ACID) 500 MG tablet Take by mouth daily.     [provider]  ZINC OXIDE PO Take 50 mg by mouth.     [provider]    Allergies    Contrast media [iodinated diagnostic agents]  Review of Systems   Review of Systems  Constitutional:  Negative for chills and fever.  HENT: Negative.   Respiratory: Negative.   Cardiovascular: Negative.   Gastrointestinal: Negative.   Musculoskeletal: Negative.        Left knee pain, known fracture  Skin: Negative.  Negative for wound.  Neurological: Negative.  Negative for numbness.    Physical Exam Updated Vital Signs BP (!) 161/79   Pulse 97   Temp 98 F (36.7 C) (Oral)   Resp 18   Ht 5\' 4"  (1.626 m)   Wt 113.4 kg   SpO2 94%   BMI 42.91 kg/m   Physical Exam Constitutional:      Appearance: She is well-developed. She is obese.  HENT:     Head: Atraumatic.  Cardiovascular:     Rate and Rhythm: Normal rate.  Pulmonary:     Effort: Pulmonary effort is normal.  Chest:     Chest wall: No tenderness.  Abdominal:     Palpations: Abdomen is soft.     Tenderness: There is no abdominal tenderness.  Musculoskeletal:     Cervical back: Normal range of motion.     Comments: Left knee markedly swollen. Distal pulses present.   No midline cervical tenderness. FROM UE's and right LE.   Skin:    General: Skin is warm and dry.  Neurological:     Mental Status: She is alert and oriented to person, place, and time.     ED Results / Procedures / Treatments   Labs (all labs ordered are listed, but only abnormal results are displayed) Labs Reviewed - No data to display Results for orders placed or performed during the hospital encounter of 03/11/20  CBC with Differential  Result Value Ref Range   WBC 8.2 4.0 - 10.5 K/uL   RBC 4.12 3.87 - 5.11 MIL/uL   Hemoglobin 13.1 12.0 - 15.0 g/dL   HCT 39.9 36.0 - 46.0 %   MCV 96.8 80.0 - 100.0 fL   MCH 31.8 26.0 - 34.0 pg   MCHC 32.8 30.0 - 36.0 g/dL   RDW 14.3 11.5 - 15.5 %   Platelets 211 150 - 400 K/uL   nRBC 0.0 0.0 - 0.2 %   Neutrophils Relative % 79 %   Neutro Abs 6.5 1.7 - 7.7 K/uL   Lymphocytes Relative 12 %   Lymphs Abs 1.0 0.7 - 4.0 K/uL   Monocytes Relative 7 %   Monocytes Absolute 0.6 0.1 - 1.0 K/uL   Eosinophils  Relative 1 %   Eosinophils Absolute 0.1 0.0 - 0.5 K/uL   Basophils Relative 1 %   Basophils Absolute 0.0 0.0 - 0.1 K/uL   Immature Granulocytes 0 %  Abs Immature Granulocytes 0.02 0.00 - 0.07 K/uL  Comprehensive metabolic panel  Result Value Ref Range   Sodium 138 135 - 145 mmol/L   Potassium 4.3 3.5 - 5.1 mmol/L   Chloride 105 98 - 111 mmol/L   CO2 21 (L) 22 - 32 mmol/L   Glucose, Bld 99 70 - 99 mg/dL   BUN 11 8 - 23 mg/dL   Creatinine, Ser 0.71 0.44 - 1.00 mg/dL   Calcium 9.2 8.9 - 10.3 mg/dL   Total Protein 7.1 6.5 - 8.1 g/dL   Albumin 3.6 3.5 - 5.0 g/dL   AST 50 (H) 15 - 41 U/L   ALT 29 0 - 44 U/L   Alkaline Phosphatase 110 38 - 126 U/L   Total Bilirubin 1.4 (H) 0.3 - 1.2 mg/dL   GFR calc non Af Amer >60 >60 mL/min   GFR calc Af Amer >60 >60 mL/min   Anion gap 12 5 - 15    EKG None  Radiology DG Knee AP/LAT W/Sunrise Left  Result Date: 03/11/2020 CLINICAL DATA:  History of osteoarthritis. Fell earlier today with anterior knee pain. EXAM: LEFT KNEE 3 VIEWS COMPARISON:  None. FINDINGS: Acute fracture of the lateral tibial plateau, depressed 2-3 mm. Lipohemarthrosis. No other fracture identified. Ordinary osteoarthritis of the weight-bearing compartments and patellofemoral joint. IMPRESSION: Acute depressed fracture of the lateral tibial plateau with lipohemarthrosis. Electronically Signed   By: Nelson Chimes M.D.   On: 03/11/2020 20:16    Procedures Procedures (including critical care time)  Medications Ordered in ED Medications  morphine 4 MG/ML injection 4 mg (has no administration in time range)  ondansetron (ZOFRAN) injection 4 mg (has no administration in time range)    ED Course  I have reviewed the triage vital signs and the nursing notes.  Pertinent labs & imaging results that were available during my care of the patient were reviewed by me and considered in my medical decision making (see chart for details).    MDM Rules/Calculators/A&P                       Patient to ED from Urgent Care with known left tibial plateau fracture after mechanical fall. Dr. Marcelino Scot was consulted from Urgent Care.   On her arrival here, pain medications ordered. Dr. Marcelino Scot paged and reports that the fracture is non-surgical. He will follow in consultation. CT of the knee pending. He states the concern for her was being unable to manage this injury at home. She is obese with chronic bilateral knee pain and needs to remain nonweight bearing on the left leg through the treatment process. She is felt to require admission to the medicine service, ortho to follow, PT. Knee immobilizer for now and hinged brace to be arranged for.   Will discuss with medicine regarding admission.   Discussed with Dr. Marlowe Sax who accepts the patient for admission. Appreciate her help with the patient's care.   Final Clinical Impression(s) / ED Diagnoses Final diagnoses:  None   1. Left tibial plateau fracture 2. Unsafe ambulation  Rx / DC Orders ED Discharge Orders    None       Dennie Bible 03/12/20 0157    Little, Wenda [place], MD 03/12/20 1515

## 2020-03-11 NOTE — ED Notes (Signed)
Pt transported to CT ?

## 2020-03-11 NOTE — ED Triage Notes (Signed)
Patient fell today.  Reports shoes stuck to carpet and patient fell.  Patient fell to left knee.  No obvious swelling, no discoloration.  Patient has arthritis in knees.  Left knee pain while non-weight bearing, and then attempts to stand causes "excrutiating" pain

## 2020-03-11 NOTE — ED Notes (Signed)
[facility] on site

## 2020-03-11 NOTE — ED Triage Notes (Signed)
Pt BIB [facility] from UC for left knee fracture. 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 Pt stated her shoe got stuck to the carpet and she fell on her left knee. Obvious fracture noted on xrays at [facility]. Dr Marcelino Scot to see.

## 2020-03-11 NOTE — ED Notes (Signed)
Notified [facility] of need for transport to ED

## 2020-03-11 NOTE — ED Notes (Signed)
Patient is being transferred to [facility] Emergency Department by [facility].  Patient is to be non-weight bearing on left leg.  T[place] H, PA has spoke to Dr Marcelino Scot and patient to go to [facility]

## 2020-03-11 NOTE — ED Provider Notes (Signed)
[name]    CSN: [id number] Arrival date & time: 03/11/20  [place]      History   Chief Complaint Chief Complaint  Patient presents with  . Fall    HPI [name] is a 69 y.o. female with remote history of cecal CA, portal hypertension second to chemotherapy, and hypothyroidism (on Synthroid) presenting for left knee pain.  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  Patient tried catching herself, though fell directly on her left knee.  Patient unable to bear weight secondary pain.  Denied distal numbness or discoloration.  Husband states fall happened at 4:30pm, though it took a few hours to get her down all the stairs at home and into urgent care due to limited mobility.   Past Medical History:  Diagnosis Date  . Cancer ([place])    cecum  . Elevated liver function tests   . Esophageal varices ([place])   . Hypothyroidism   . Iron deficiency anemia   . Liver disease    chemotherapy complication, per pt, shunts placed to bypass liver  . Malignant neoplasm of cecum ([place])   . Portal hypertension ([place])   . Splenomegaly     Patient Active Problem List   Diagnosis Date Noted  . Tibial plateau fracture 03/12/2020  . Elevated blood pressure reading 03/12/2020  . Chronic pain of both knees 01/29/2020  . Morbid obesity ([place]) 01/29/2020  . Hypothyroidism 06/30/2019  . Basal cell carcinoma of nose 06/30/2019  . Colon cancer ([place]) 11/30/2011  . Hemorrhage of gastrointestinal tract 05/04/2011  . Esophageal varices ([place]) 06/12/2010  . Portal hypertension ([place]) 01/23/2008    Past Surgical History:  Procedure Laterality Date  . ESOPHAGEAL VARICE LIGATION    . HEMICOLECTOMY  01/08/03  . LIVER SURGERY     shunts placed after chemo complication  . SKIN FULL THICKNESS GRAFT N/A 09/12/2019   Procedure: debridement and FTSG to the nose from left upper arm;  Surgeon: Cindra Presume, MD;  Location: [place];  Service:  Plastics;  Laterality: N/A;  2 hours, please    OB History   No obstetric history on file.      Home Medications    Prior to Admission medications   Medication Sig Start Date End Date Taking? Authorizing Provider  Cholecalciferol (VITAMIN D3) 50 MCG (2000 UT) TABS Take 2,000 Units by mouth at bedtime.     [provider]  Cyanocobalamin (VITAMIN B-12) 5000 MCG TBDP Take 5,000 mcg by mouth at bedtime.     [provider]  ELDERBERRY PO Take 5 mLs by mouth at bedtime.     [provider]  Glucosamine-Chondroitin 1500-1200 MG/30ML LIQD Take 30 mLs by mouth at bedtime.     [provider]  Multiple Vitamin (MULTIVITAMIN WITH MINERALS) TABS tablet Take 1 tablet by mouth at bedtime.    [provider]  omega-3 acid ethyl esters (LOVAZA) 1 g capsule Take 1 g by mouth at bedtime.     [provider]  SYNTHROID 75 MCG tablet TAKE 1 TABLET (75 MCG TOTAL) BY MOUTH DAILY BEFORE BREAKFAST. 12/11/19   Elby Beck, FNP  TURMERIC PO Take 1 tablet by mouth at bedtime.     [provider]  UNABLE TO FIND Take 1 tablet by mouth at bedtime. Med Name: Collagen-Biotin 2000mg      [provider]  VITAMIN A PO Take 1 tablet by mouth at bedtime.  [provider]  vitamin C (ASCORBIC ACID) 500 MG tablet Take 500 mg by mouth at bedtime.     [provider]  zinc gluconate 50 MG tablet Take 50 mg by mouth at bedtime.    [provider]    Family History Family History  Problem Relation Age of Onset  . Arthritis Mother   . Hearing loss Mother   . Heart disease Mother   . Hypertension Mother   . Arthritis Father   . Diabetes Father   . Heart disease Father   . Breast cancer Neg Hx     Social History Social History   Tobacco Use  . Smoking status: Never Smoker  . Smokeless tobacco: Never Used  Substance Use Topics  . Alcohol use: Never  . Drug use: Never     Allergies   Contrast media  [iodinated diagnostic agents]   Review of Systems As per HPI   Physical Exam Triage Vital Signs ED Triage Vitals  Enc Vitals Group     BP      Pulse      Resp      Temp      Temp src      SpO2      Weight      Height      Head Circumference      Peak Flow      Pain Score      Pain Loc      Pain Edu?      Excl. in [place]?    No data found.  Updated Vital Signs BP (!) 153/76 (BP Location: Left Arm)   Pulse 88   Temp 97.7 F (36.5 C) (Oral)   Resp 18   SpO2 95%   Visual Acuity Right Eye Distance:   Left Eye Distance:   Bilateral Distance:    Right Eye Near:   Left Eye Near:    Bilateral Near:     Physical Exam Constitutional:      General: She is not in acute distress. HENT:     Head: Normocephalic and atraumatic.  Eyes:     General: No scleral icterus.    Pupils: Pupils are equal, round, and reactive to light.  Cardiovascular:     Rate and Rhythm: Normal rate and regular rhythm.  Pulmonary:     Effort: Pulmonary effort is normal. No respiratory distress.     Breath sounds: No wheezing.  Musculoskeletal:     Comments: Left knee swollen as compared to right.  Patient does have diffuse medial and lateral tenderness as well as anterior tibial tenderness.  Decreased ROM second to pain.  Strength deferred.  Sensation and pulses intact.  Skin:    Coloration: Skin is not jaundiced or pale.  Neurological:     Mental Status: She is alert and oriented to person, place, and time.      UC Treatments / Results  Labs (all labs ordered are listed, but only abnormal results are displayed) Labs Reviewed - No data to display  EKG   Radiology DG Knee AP/LAT W/Sunrise Left  Result Date: 03/11/2020 CLINICAL DATA:  History of osteoarthritis. Fell earlier today with anterior knee pain. EXAM: LEFT KNEE 3 VIEWS COMPARISON:  None. FINDINGS: Acute fracture of the lateral tibial plateau, depressed 2-3 mm. Lipohemarthrosis. No other fracture identified. Ordinary  osteoarthritis of the weight-bearing compartments and patellofemoral joint. IMPRESSION: Acute depressed fracture of the lateral tibial plateau with lipohemarthrosis. Electronically Signed   By: Elta Guadeloupe  Shogry M.D.   On: 03/11/2020 20:16   Procedures Procedures (including critical care time)  Medications Ordered in UC Medications - No data to display  Initial Impression / Assessment and Plan / UC Course  I have reviewed the triage vital signs and the nursing notes.  Pertinent labs & imaging results that were available during my care of the patient were reviewed by me and considered in my medical decision making (see chart for details).     Patient nontoxic in office today.  No head trauma, LOC.  Given patient's significant pain and habitus x-ray of knee was obtained in office.  This is reviewed by me and radiology: Significant for acute fracture of left lateral tibial plateau, depressed 2-3 mm.  Lipohemarthrosis noted.  Consulted with Dr. Marcelino Scot of orthopedics: Hoping to manage this nonoperatively given appearance of fracture and patient's comorbidities.  Would like CT to further evaluate: unable to do so here.  Due to patient's habitus, history of increased fall risk, as well as living situation: Concerned that patient will not be able to be nonweightbearing upon discharge from UC.  Recommended presenting to [facility] for admission to medical services for further evaluation and to get home services coordinated.  Discussed imaging and consult recommendations with patient and husband who verbalized understanding.  Elected to transport via EMS to [facility] for further evaluation and resources.  Patient transported in stable condition by EMS. Final Clinical Impressions(s) / UC Diagnoses   Final diagnoses:  Closed fracture of left tibial plateau, initial encounter  Fall, initial encounter     Discharge Instructions     Consulted with Dr. Altamese Secretary from orthopedics who is aware of patient's fracture and  clinical status. Referred to ER for further evaluation/management.  Dr. Marcelino Scot is available for consult.    ED Prescriptions    None     PDMP not reviewed this encounter.   Hall-Potvin, [place], [place] 03/12/20 [phone]

## 2020-03-11 NOTE — Discharge Instructions (Addendum)
Consulted with Dr. Altamese Hermitage from orthopedics who is aware of patient's fracture and clinical status. Referred to ER for further evaluation/management.  Dr. Marcelino Scot is available for consult.

## 2020-03-12 ENCOUNTER — Encounter: Payer: Self-pay | Admitting: Emergency Medicine

## 2020-03-12 DIAGNOSIS — Z85828 Personal history of other malignant neoplasm of skin: Secondary | ICD-10-CM | POA: Diagnosis not present

## 2020-03-12 DIAGNOSIS — Z7401 Bed confinement status: Secondary | ICD-10-CM | POA: Diagnosis not present

## 2020-03-12 DIAGNOSIS — Z79890 Hormone replacement therapy (postmenopausal): Secondary | ICD-10-CM | POA: Diagnosis not present

## 2020-03-12 DIAGNOSIS — M1990 Unspecified osteoarthritis, unspecified site: Secondary | ICD-10-CM | POA: Diagnosis present

## 2020-03-12 DIAGNOSIS — Z85038 Personal history of other malignant neoplasm of large intestine: Secondary | ICD-10-CM | POA: Diagnosis not present

## 2020-03-12 DIAGNOSIS — Y92019 Unspecified place in single-family (private) house as the place of occurrence of the external cause: Secondary | ICD-10-CM | POA: Diagnosis not present

## 2020-03-12 DIAGNOSIS — R110 Nausea: Secondary | ICD-10-CM | POA: Diagnosis not present

## 2020-03-12 DIAGNOSIS — C189 Malignant neoplasm of colon, unspecified: Secondary | ICD-10-CM | POA: Diagnosis not present

## 2020-03-12 DIAGNOSIS — Z743 Need for continuous supervision: Secondary | ICD-10-CM | POA: Diagnosis not present

## 2020-03-12 DIAGNOSIS — Z833 Family history of diabetes mellitus: Secondary | ICD-10-CM | POA: Diagnosis not present

## 2020-03-12 DIAGNOSIS — K766 Portal hypertension: Secondary | ICD-10-CM | POA: Diagnosis not present

## 2020-03-12 DIAGNOSIS — Z8261 Family history of arthritis: Secondary | ICD-10-CM | POA: Diagnosis not present

## 2020-03-12 DIAGNOSIS — W010XXA Fall on same level from slipping, tripping and stumbling without subsequent striking against object, initial encounter: Secondary | ICD-10-CM | POA: Diagnosis present

## 2020-03-12 DIAGNOSIS — Z20822 Contact with and (suspected) exposure to covid-19: Secondary | ICD-10-CM | POA: Diagnosis present

## 2020-03-12 DIAGNOSIS — S82142D Displaced bicondylar fracture of left tibia, subsequent encounter for closed fracture with routine healing: Secondary | ICD-10-CM | POA: Diagnosis not present

## 2020-03-12 DIAGNOSIS — D509 Iron deficiency anemia, unspecified: Secondary | ICD-10-CM | POA: Diagnosis present

## 2020-03-12 DIAGNOSIS — W19XXXA Unspecified fall, initial encounter: Secondary | ICD-10-CM | POA: Diagnosis not present

## 2020-03-12 DIAGNOSIS — M25062 Hemarthrosis, left knee: Secondary | ICD-10-CM | POA: Diagnosis present

## 2020-03-12 DIAGNOSIS — M2550 Pain in unspecified joint: Secondary | ICD-10-CM | POA: Diagnosis not present

## 2020-03-12 DIAGNOSIS — S82143A Displaced bicondylar fracture of unspecified tibia, initial encounter for closed fracture: Secondary | ICD-10-CM | POA: Diagnosis not present

## 2020-03-12 DIAGNOSIS — R5381 Other malaise: Secondary | ICD-10-CM | POA: Diagnosis not present

## 2020-03-12 DIAGNOSIS — T451X5A Adverse effect of antineoplastic and immunosuppressive drugs, initial encounter: Secondary | ICD-10-CM | POA: Diagnosis present

## 2020-03-12 DIAGNOSIS — E039 Hypothyroidism, unspecified: Secondary | ICD-10-CM | POA: Diagnosis present

## 2020-03-12 DIAGNOSIS — Z9221 Personal history of antineoplastic chemotherapy: Secondary | ICD-10-CM | POA: Diagnosis not present

## 2020-03-12 DIAGNOSIS — K769 Liver disease, unspecified: Secondary | ICD-10-CM | POA: Diagnosis present

## 2020-03-12 DIAGNOSIS — Z79899 Other long term (current) drug therapy: Secondary | ICD-10-CM | POA: Diagnosis not present

## 2020-03-12 DIAGNOSIS — R030 Elevated blood-pressure reading, without diagnosis of hypertension: Secondary | ICD-10-CM

## 2020-03-12 DIAGNOSIS — Z8249 Family history of ischemic heart disease and other diseases of the circulatory system: Secondary | ICD-10-CM | POA: Diagnosis not present

## 2020-03-12 DIAGNOSIS — Z9049 Acquired absence of other specified parts of digestive tract: Secondary | ICD-10-CM | POA: Diagnosis not present

## 2020-03-12 DIAGNOSIS — S82142A Displaced bicondylar fracture of left tibia, initial encounter for closed fracture: Secondary | ICD-10-CM | POA: Diagnosis present

## 2020-03-12 DIAGNOSIS — Z91041 Radiographic dye allergy status: Secondary | ICD-10-CM | POA: Diagnosis not present

## 2020-03-12 DIAGNOSIS — Z6841 Body Mass Index (BMI) 40.0 and over, adult: Secondary | ICD-10-CM | POA: Diagnosis not present

## 2020-03-12 LAB — CBC WITH DIFFERENTIAL/PLATELET
Abs Immature Granulocytes: 0.02 10*3/uL (ref 0.00–0.07)
Basophils Absolute: 0 10*3/uL (ref 0.0–0.1)
Basophils Relative: 1 %
Eosinophils Absolute: 0.1 10*3/uL (ref 0.0–0.5)
Eosinophils Relative: 1 %
HCT: 39.9 % (ref 36.0–46.0)
Hemoglobin: 13.1 g/dL (ref 12.0–15.0)
Immature Granulocytes: 0 %
Lymphocytes Relative: 12 %
Lymphs Abs: 1 10*3/uL (ref 0.7–4.0)
MCH: 31.8 pg (ref 26.0–34.0)
MCHC: 32.8 g/dL (ref 30.0–36.0)
MCV: 96.8 fL (ref 80.0–100.0)
Monocytes Absolute: 0.6 10*3/uL (ref 0.1–1.0)
Monocytes Relative: 7 %
Neutro Abs: 6.5 10*3/uL (ref 1.7–7.7)
Neutrophils Relative %: 79 %
Platelets: 211 10*3/uL (ref 150–400)
RBC: 4.12 MIL/uL (ref 3.87–5.11)
RDW: 14.3 % (ref 11.5–15.5)
WBC: 8.2 10*3/uL (ref 4.0–10.5)
nRBC: 0 % (ref 0.0–0.2)

## 2020-03-12 LAB — COMPREHENSIVE METABOLIC PANEL
ALT: 29 U/L (ref 0–44)
AST: 50 U/L — ABNORMAL HIGH (ref 15–41)
Albumin: 3.6 g/dL (ref 3.5–5.0)
Alkaline Phosphatase: 110 U/L (ref 38–126)
Anion gap: 12 (ref 5–15)
BUN: 11 mg/dL (ref 8–23)
CO2: 21 mmol/L — ABNORMAL LOW (ref 22–32)
Calcium: 9.2 mg/dL (ref 8.9–10.3)
Chloride: 105 mmol/L (ref 98–111)
Creatinine, Ser: 0.71 mg/dL (ref 0.44–1.00)
GFR calc Af Amer: >60 mL/min (ref >60)
GFR calc non Af Amer: >60 mL/min (ref >60)
Glucose, Bld: 99 mg/dL (ref 70–99)
Potassium: 4.3 mmol/L (ref 3.5–5.1)
Sodium: 138 mmol/L (ref 135–145)
Total Bilirubin: 1.4 mg/dL — ABNORMAL HIGH (ref 0.3–1.2)
Total Protein: 7.1 g/dL (ref 6.5–8.1)

## 2020-03-12 LAB — SARS CORONAVIRUS 2 (TAT 6-24 HRS): SARS Coronavirus 2: NEGATIVE

## 2020-03-12 LAB — HIV ANTIBODY (ROUTINE TESTING W REFLEX): HIV Screen 4th Generation wRfx: NONREACTIVE

## 2020-03-12 MED ORDER — OMEGA-3-ACID ETHYL ESTERS 1 G PO CAPS
1.0000 g | ORAL_CAPSULE | Freq: Every day | ORAL | Status: DC
  Administered 2020-03-12 – 2020-03-13 (×2): 1 g via ORAL
  Filled 2020-03-12 (×3): qty 1

## 2020-03-12 MED ORDER — ONDANSETRON HCL 4 MG/2ML IJ SOLN
4.0000 mg | Freq: Four times a day (QID) | INTRAMUSCULAR | Status: DC | PRN
  Administered 2020-03-12 – 2020-03-13 (×4): 4 mg via INTRAVENOUS
  Filled 2020-03-12 (×4): qty 2

## 2020-03-12 MED ORDER — ACETAMINOPHEN 650 MG RE SUPP: 650.0000 mg | Freq: Four times a day (QID) | RECTAL | Status: DC | PRN

## 2020-03-12 MED ORDER — LEVOTHYROXINE SODIUM 75 MCG PO TABS
75.0000 ug | ORAL_TABLET | Freq: Every day | ORAL | Status: DC
  Administered 2020-03-12 – 2020-03-14 (×3): 75 ug via ORAL
  Filled 2020-03-12 (×3): qty 1

## 2020-03-12 MED ORDER — ADULT MULTIVITAMIN W/MINERALS CH
1.0000 | ORAL_TABLET | Freq: Every day | ORAL | Status: DC
  Administered 2020-03-12 – 2020-03-13 (×2): 1 via ORAL
  Filled 2020-03-12 (×2): qty 1

## 2020-03-12 MED ORDER — VITAMIN B-12 1000 MCG PO TABS
5000.0000 ug | ORAL_TABLET | Freq: Every day | ORAL | Status: DC
  Administered 2020-03-12 – 2020-03-13 (×2): 5000 ug via ORAL
  Filled 2020-03-12 (×2): qty 5

## 2020-03-12 MED ORDER — HYDROCODONE-ACETAMINOPHEN 5-325 MG PO TABS
1.0000 | ORAL_TABLET | ORAL | Status: DC | PRN
  Administered 2020-03-12 – 2020-03-13 (×4): 1 via ORAL
  Filled 2020-03-12 (×5): qty 1

## 2020-03-12 MED ORDER — MORPHINE SULFATE (PF) 2 MG/ML IV SOLN
2.0000 mg | INTRAVENOUS | Status: DC | PRN
  Administered 2020-03-12 – 2020-03-14 (×6): 2 mg via INTRAVENOUS
  Filled 2020-03-12 (×6): qty 1

## 2020-03-12 MED ORDER — MORPHINE SULFATE (PF) 4 MG/ML IV SOLN
4.0000 mg | Freq: Once | INTRAVENOUS | Status: AC
  Administered 2020-03-12: 4 mg via INTRAVENOUS
  Filled 2020-03-12: qty 1

## 2020-03-12 MED ORDER — ENOXAPARIN SODIUM 60 MG/0.6ML ~~LOC~~ SOLN
50.0000 mg | SUBCUTANEOUS | Status: DC
  Administered 2020-03-12 – 2020-03-14 (×3): 50 mg via SUBCUTANEOUS
  Filled 2020-03-12 (×3): qty 0.6

## 2020-03-12 MED ORDER — ACETAMINOPHEN 325 MG PO TABS: 650.0000 mg | ORAL_TABLET | Freq: Four times a day (QID) | ORAL | Status: DC | PRN

## 2020-03-12 MED ORDER — VITAMIN D 25 MCG (1000 UNIT) PO TABS
2000.0000 [IU] | ORAL_TABLET | Freq: Every day | ORAL | Status: DC
  Administered 2020-03-12 – 2020-03-13 (×2): 2000 [IU] via ORAL
  Filled 2020-03-12 (×2): qty 2

## 2020-03-12 NOTE — H&P (Signed)
History and Physical    [name] [id number] DOB: 28-Jul-1951 DOA: 03/11/2020  PCP: Elby Beck, FNP Patient coming from: Urgent care  Chief Complaint: Fall, left knee pain  HPI: [name] is a 69 y.o. female with medical history significant of cancer of cecum status post hemicolectomy, liver disease as complication of chemotherapy with resulting portal hypertension and esophageal variceal bleeding status post TIPS, hypothyroidism, iron deficiency anemia, morbid obesity (BMI 42.91) presenting with complaints of left knee pain secondary to a mechanical fall.  Patient states she was wearing tennis shoes and while walking on the carpet tripped and fell on her left knee.  Since then she has been having severe pain in her knee.  Denies injuring her head or any other injuries from the fall.  Denies dizziness, chest pain, shortness of breath, cough, nausea, vomiting, abdominal pain, or diarrhea.  ED Course: Blood pressure elevated and borderline tachycardic.  Remainder of vital signs stable. X-ray of the left knee showing acute depressed fracture of the lateral tibial plateau with lipohemarthrosis. CT of the left knee showing lateral tibial plateau fracture with lipohemarthrosis.  Knee immobilizer placed.  ED provider consulted Dr. Marcelino Scot from orthopedics who recommended nonsurgical treatment.  He will see the patient in consultation.  Review of Systems:  All systems reviewed and apart from history of presenting illness, are negative.  Past Medical History:  Diagnosis Date  . Cancer ([place])    cecum  . Elevated liver function tests   . Esophageal varices ([place])   . Hypothyroidism   . Iron deficiency anemia   . Liver disease    chemotherapy complication, per pt, shunts placed to bypass liver  . Malignant neoplasm of cecum ([place])   . Portal hypertension ([place])   . Splenomegaly     Past Surgical History:  Procedure Laterality Date  . ESOPHAGEAL VARICE LIGATION    .  HEMICOLECTOMY  01/08/03  . LIVER SURGERY     shunts placed after chemo complication  . SKIN FULL THICKNESS GRAFT N/A 09/12/2019   Procedure: debridement and FTSG to the nose from left upper arm;  Surgeon: Cindra Presume, MD;  Location: [facility];  Service: Plastics;  Laterality: N/A;  2 hours, please     reports that she has never smoked. She has never used smokeless tobacco. She reports that she does not drink alcohol or use drugs.  Allergies  Allergen Reactions  . Contrast Media [Iodinated Diagnostic Agents] Hives    Family History  Problem Relation Age of Onset  . Arthritis Mother   . Hearing loss Mother   . Heart disease Mother   . Hypertension Mother   . Arthritis Father   . Diabetes Father   . Heart disease Father   . Breast cancer Neg Hx     Prior to Admission medications   Medication Sig Start Date End Date Taking? Authorizing Provider  Cholecalciferol (VITAMIN D3) 50 MCG (2000 UT) TABS Take 2,000 Units by mouth at bedtime.    Yes [provider]  Cyanocobalamin (VITAMIN B-12) 5000 MCG TBDP Take 5,000 mcg by mouth at bedtime.    Yes [provider]  ELDERBERRY PO Take 5 mLs by mouth at bedtime.    Yes [provider]  Glucosamine-Chondroitin 1500-1200 MG/30ML LIQD Take 30 mLs by mouth at bedtime.    Yes [provider]  Multiple Vitamin (MULTIVITAMIN WITH MINERALS) TABS tablet Take 1 tablet by mouth at bedtime.   Yes [provider]  omega-3 acid ethyl esters (LOVAZA) 1 g capsule Take 1 g by mouth at bedtime.    Yes [provider]  SYNTHROID 75 MCG tablet TAKE 1 TABLET (75 MCG TOTAL) BY MOUTH DAILY BEFORE BREAKFAST. 12/11/19  Yes Elby Beck, FNP  TURMERIC PO Take 1 tablet by mouth at bedtime.    Yes [provider]  UNABLE TO FIND Take 1 tablet by mouth at bedtime. Med Name: Collagen-Biotin 2000mg     Yes [provider]  VITAMIN A PO Take 1 tablet by mouth at bedtime.    Yes  [provider]  vitamin C (ASCORBIC ACID) 500 MG tablet Take 500 mg by mouth at bedtime.    Yes [provider]  zinc gluconate 50 MG tablet Take 50 mg by mouth at bedtime.   Yes [provider]    Physical Exam: Vitals:   03/11/20 2245 03/11/20 2300 03/11/20 2315 03/11/20 2330  BP: (!) 149/83 (!) 171/93 (!) 170/92 (!) 184/83  Pulse: 97 (!) 101 99 98  Resp:      Temp:      TempSrc:      SpO2: 95% 96% 95% 95%  Weight:      Height:        Physical Exam  Constitutional: She is oriented to person, place, and time. She appears well-developed and well-nourished. No distress.  HENT:  Head: Normocephalic.  Eyes: Right eye exhibits no discharge. Left eye exhibits no discharge.  Cardiovascular: Normal rate, regular rhythm and intact distal pulses.  Pulmonary/Chest: Effort normal and breath sounds normal. No respiratory distress. She has no wheezes. She has no rales.  Abdominal: Soft. Bowel sounds are normal. She exhibits no distension. There is no abdominal tenderness. There is no guarding.  Musculoskeletal:        General: Edema present.     Cervical back: Neck supple.     Comments: +1 to +2 pedal edema (chronic per patient)  Neurological: She is alert and oriented to person, place, and time.  Skin: Skin is warm and dry. She is not diaphoretic.     Labs on Admission: I have personally reviewed following labs and imaging studies  CBC: Recent Labs  Lab 03/12/20 0010  WBC 8.2  NEUTROABS 6.5  HGB 13.1  HCT 39.9  MCV 96.8  PLT 123456   Basic Metabolic Panel: Recent Labs  Lab 03/12/20 0010  NA 138  K 4.3  CL 105  CO2 21*  GLUCOSE 99  BUN 11  CREATININE 0.71  CALCIUM 9.2   GFR: Estimated Creatinine Clearance: 83.1 mL/min (by C-G formula based on SCr of 0.71 mg/dL). Liver Function Tests: Recent Labs  Lab 03/12/20 0010  AST 50*  ALT 29  ALKPHOS 110  BILITOT 1.4*  PROT 7.1  ALBUMIN 3.6   No results for input(s): LIPASE, AMYLASE in the  last 168 hours. No results for input(s): AMMONIA in the last 168 hours. Coagulation Profile: No results for input(s): INR, PROTIME in the last 168 hours. Cardiac Enzymes: No results for input(s): CKTOTAL, CKMB, CKMBINDEX, TROPONINI in the last 168 hours. BNP (last 3 results) No results for input(s): PROBNP in the last 8760 hours. HbA1C: No results for input(s): HGBA1C in the last 72 hours. CBG: No results for input(s): GLUCAP in the last 168 hours. Lipid Profile: No results for input(s): CHOL, HDL, LDLCALC, TRIG, CHOLHDL, LDLDIRECT in the last 72 hours. Thyroid Function Tests: No results for input(s): TSH, T4TOTAL, FREET4, T3FREE, THYROIDAB in the last 72 hours.  Anemia Panel: No results for input(s): VITAMINB12, FOLATE, FERRITIN, TIBC, IRON, RETICCTPCT in the last 72 hours. Urine analysis:    Component Value Date/Time   COLORURINE YELLOW 12/20/2010 [place] 12/20/2010 1449   LABSPEC 1.009 12/20/2010 1449   PHURINE 6.0 12/20/2010 1449   HGBUR NEGATIVE 12/20/2010 1449   BILIRUBINUR NEGATIVE 12/20/2010 1449   KETONESUR NEGATIVE 12/20/2010 1449   PROTEINUR NEGATIVE 12/20/2010 1449   UROBILINOGEN 0.2 12/20/2010 1449   NITRITE NEGATIVE 12/20/2010 1449   LEUKOCYTESUR SMALL (A) 12/20/2010 1449    Radiological Exams on Admission: CT Knee Left Wo Contrast  Result Date: 03/12/2020 CLINICAL DATA:  Tibial plateau fracture EXAM: CT OF THE LEFT KNEE WITHOUT CONTRAST TECHNIQUE: Multidetector CT imaging of the left knee was performed according to the standard protocol. Multiplanar CT image reconstructions were also generated. COMPARISON:  Plain films today FINDINGS: There is a lateral tibial plateau fracture. Minimal depression and displacement. Associated joint effusion with lipohemarthrosis. No subluxation or dislocation. No additional acute bony abnormality. IMPRESSION: Lateral tibial plateau fracture with lipohemarthrosis. Electronically Signed   By: Rolm Baptise M.D.   On:  03/12/2020 00:05   DG Knee AP/LAT W/[place] Left  Result Date: 03/11/2020 CLINICAL DATA:  History of osteoarthritis. Fell earlier today with anterior knee pain. EXAM: LEFT KNEE 3 VIEWS COMPARISON:  None. FINDINGS: Acute fracture of the lateral tibial plateau, depressed 2-3 mm. Lipohemarthrosis. No other fracture identified. Ordinary osteoarthritis of the weight-bearing compartments and patellofemoral joint. IMPRESSION: Acute depressed fracture of the lateral tibial plateau with lipohemarthrosis. Electronically Signed   By: Nelson Chimes M.D.   On: 03/11/2020 20:16     Assessment/Plan Principal Problem:   Tibial plateau fracture Active Problems:   Hypothyroidism   Elevated blood pressure reading   Left lateral tibial plateau fracture secondary to a mechanical fall: CT of the left knee showing lateral tibial plateau fracture with lipohemarthrosis. Knee immobilizer placed.  ED provider consulted Dr. Marcelino Scot from orthopedics who recommended nonsurgical treatment.  He will see the patient in consultation.  Continue pain management with morphine as needed.  Order PT and OT evaluation.  Elevated blood pressure readings: No documented history of hypertension.  Pain likely contributing.  Blood pressure significantly improved after pain management.  Most recent reading 123/65.  Continue to monitor.  Hypothyroidism: Continue home Synthroid  DVT prophylaxis: SCDs at this time Code Status: Full code Family Communication: No family available at this time. Disposition Plan: Anticipate discharge in 1 to 2 days. Consults called: Orthopedics Admission status: It is my clinical opinion that referral for OBSERVATION is reasonable and necessary in this patient based on the above information provided. The aforementioned taken together are felt to place the patient at high risk for further clinical deterioration. However it is anticipated that the patient may be medically stable for discharge from the hospital within  24 to 48 hours.  The medical decision making on this patient was of high complexity and the patient is at high risk for clinical deterioration, therefore this is a level 3 visit.  Shela Leff MD [facility]  If 7PM-7AM, please contact night-coverage [email or web address]  03/12/2020, 3:44 AM

## 2020-03-12 NOTE — Progress Notes (Signed)
Orthopedic Tech Progress Note Patient Details:  [name] 05/31/1951 [id number] Called in order to [facility] for a ROM KNEE BRACE Patient ID: [name], female   DOB: 01-22-1951, 69 y.o.   MRN: [id number]   Janit Pagan 03/12/2020, 12:53 PM

## 2020-03-12 NOTE — Progress Notes (Signed)
Orthopedic Tech Progress Note Patient Details:  [name] [birth year] [id number]  Ortho Devices Type of Ortho Device: Knee Immobilizer Ortho Device/Splint Location: LLE Ortho Device/Splint Interventions: Application   Post Interventions Patient Tolerated: Well Instructions Provided: Care of device, Adjustment of device   [name] [name] 03/12/2020, 12:35 AM

## 2020-03-12 NOTE — Progress Notes (Signed)
Orthopaedic Trauma Service   Aware of consult Full eval later this afternoon   Minimally depressed L lateral tibial plateau fracture, baseline tricompartmental knee DJD Plan for non-op management NWB L leg x 6 weeks Unrestricted ROM L knee  Will order hinged knee brace  Given oncologic history think lovenox x 21 days would be beneficial   Ok to start therapies   Jari Pigg, PA-C [phone] (C) 03/12/2020, 12:35 PM  [facility] [place] [id number] [phone] Domingo Sep (F)

## 2020-03-12 NOTE — Evaluation (Signed)
Occupational Therapy Evaluation Patient Details Name: [name] MRN: [id number] DOB: [birth year] Today's Date: 03/12/2020    History of Present Illness This 69 y.o. female admitted after tripping on carpet and sustaining a fall.  She was found to have tibia plateau fx Lt LE (non operative).  PMH includes: CA of cecum, esophageal varices, portal HTN    Clinical Impression   Pt admitted with above. She demonstrates the below listed deficits and will benefit from continued OT to maximize safety and independence with BADLs.  Pt seen in conjunction with PT.  Pt required mod A +2 to move to EOB, and was able to stand with min A +2 from elevated bed height and side shuffled up EOB with min A +2 while maintaining NWB.  She requires set up assist - total A for ADLs.  She lives with spouse and was fully independent PTA.  Spouse if very supportive.  They do have multiple steps at both entrances of their home which prove difficult for pt to negotiate so ambulance transport home recommended.  She will also require w/c for home use.  Will follow acutely.       Follow Up Recommendations  Home health OT;Supervision/Assistance - 24 hour    Equipment Recommendations  3 in 1 bedside commode;Tub/shower bench;Wheelchair (measurements OT);Wheelchair cushion (measurements OT)    Recommendations for Other Services       Precautions / Restrictions Precautions Precautions: Fall      Mobility Bed Mobility Overal bed mobility: Needs Assistance Bed Mobility: Supine to Sit;Sit to Supine     Supine to sit: Mod assist;+2 for physical assistance;+2 for safety/equipment Sit to supine: Mod assist;+2 for physical assistance;+2 for safety/equipment   General bed mobility comments: pt requires verbal cues for problem solving and sequencing as well as assist for LEs and to lift trunk   Transfers Overall transfer level: Needs assistance   Transfers: Sit to/from Stand Sit to Stand: +2 physical assistance;+2  safety/equipment;Min assist;From elevated surface         General transfer comment: Attempted to stand from standard bed height, but pt unable to achieve standing.  With bed level elevated, she was able to move into standing x 2 with assist to boost hips and to steady     Balance Overall balance assessment: Needs assistance Sitting-balance support: Feet supported Sitting balance-Leahy Scale: Fair     Standing balance support: Bilateral upper extremity supported Standing balance-Leahy Scale: Poor Standing balance comment: reliant on bil. UE support                            ADL either performed or assessed with clinical judgement   ADL Overall ADL's : Needs assistance/impaired Eating/Feeding: Independent   Grooming: Wash/dry hands;Wash/dry face;Oral care;Brushing hair;Set up;Bed level   Upper Body Bathing: Set up;Bed level   Lower Body Bathing: Maximal assistance;Bed level;Sit to/from stand   Upper Body Dressing : Moderate assistance;Sitting   Lower Body Dressing: Total assistance;Sit to/from stand   Toilet Transfer: Total assistance Toilet Transfer Details (indicate cue type and reason): unable to attempt this date  Toileting- Clothing Manipulation and Hygiene: Total assistance;Sit to/from stand       Functional mobility during ADLs: Minimal assistance;+2 for physical assistance;Rolling walker       Vision Baseline Vision/History: Wears glasses Wears Glasses: At all times       Perception     Praxis      Pertinent Vitals/Pain Pain Assessment: 0-10  Pain Score: 8  Pain Location: Lt knee  Pain Descriptors / Indicators: Aching;Sharp;Grimacing;Guarding Pain Intervention(s): Limited activity within patient's tolerance;Monitored during session;Premedicated before session;Repositioned     Hand Dominance Right   Extremity/Trunk Assessment Upper Extremity Assessment Upper Extremity Assessment: Overall WFL for tasks assessed   Lower Extremity  Assessment Lower Extremity Assessment: Defer to PT evaluation   Cervical / Trunk Assessment Cervical / Trunk Assessment: Normal   Communication Communication Communication: No difficulties   Cognition Arousal/Alertness: Awake/alert Behavior During Therapy: WFL for tasks assessed/performed;Anxious Overall Cognitive Status: Within Functional Limits for tasks assessed                                     General Comments  Spouse present.  Pt very fearful of pain and moving     Exercises     Shoulder Instructions      Home Living Family/patient expects to be discharged to:: Private residence Living Arrangements: Spouse/significant other Available Help at Discharge: Family;Available 24 hours/day Type of Home: House Home Access: Stairs to enter [employer] of Steps: 2 steps onto [address] and 2 more into house: 2 of them have handrails and one of them is big; Front door has 5 steps has rails   Home Layout: One level     Bathroom Shower/Tub: Corporate investment banker: Standard Bathroom Accessibility: No   Home Equipment: Crutches;Cane - single point   Additional Comments: has access to w/c but not sure if leg rest elevate      Prior Functioning/Environment Level of Independence: Independent        Comments: Other than having difficulty getting in/out shower she was independent; works as Nurse, learning disability ; no recent falls other than this one        OT Problem List: Decreased activity tolerance;Impaired balance (sitting and/or standing);Decreased safety awareness;Decreased knowledge of use of DME or AE;Decreased knowledge of precautions;Obesity;Pain      OT Treatment/Interventions: Self-care/ADL training;DME and/or AE instruction;Therapeutic activities;Patient/family education;Balance training    OT Goals(Current goals can be found in the care plan section) Acute Rehab OT Goals Patient Stated Goal: to have less pain, and be able to go  home  OT Goal Formulation: With patient/family Time For Goal Achievement: 03/26/20 Potential to Achieve Goals: Good ADL Goals Pt Will Perform Upper Body Bathing: with set-up;sitting Pt Will Perform Lower Body Bathing: with mod assist;sit to/from stand;with adaptive equipment Pt Will Perform Upper Body Dressing: with set-up;sitting Pt Will Perform Lower Body Dressing: with mod assist;sit to/from stand;with adaptive equipment Pt Will Transfer to Toilet: with min guard assist;stand pivot transfer;bedside commode Pt Will Perform Toileting - Clothing Manipulation and hygiene: with min guard assist;sitting/lateral leans;with adaptive equipment Pt Will Perform Tub/Shower Transfer: with min assist;shower seat;Stand pivot transfer;rolling walker  OT Frequency: Min 2X/week   Barriers to D/C:    husband supportive        Co-evaluation PT/OT/SLP Co-Evaluation/Treatment: Yes Reason for Co-Treatment: For patient/therapist safety   OT goals addressed during session: Strengthening/ROM      AM-PAC OT "6 Clicks" Daily Activity     Outcome Measure Help from another person eating meals?: None Help from another person taking care of personal grooming?: A Little Help from another person toileting, which includes using toliet, bedpan, or urinal?: Total Help from another person bathing (including washing, rinsing, drying)?: A Lot Help from another person to put on and taking off regular upper body clothing?: A  Lot Help from another person to put on and taking off regular lower body clothing?: Total 6 Click Score: 13   End of Session Equipment Utilized During Treatment: Gait belt;Rolling walker;Other (comment)([place]e brace ) Nurse Communication: Mobility status;Patient requests pain meds;Weight bearing status  Activity Tolerance: Patient limited by pain Patient left: in bed;with call bell/phone within reach;with family/visitor present  OT Visit Diagnosis: Pain Pain - Right/Left: Left Pain - part  of body: Knee                Time: [id number] OT Time Calculation (min): 60 min Charges:  OT General Charges $OT Visit: 1 Visit OT Evaluation $OT Eval Moderate Complexity: 1 Mod OT Treatments $Therapeutic Activity: 8-22 mins  Nilsa Nutting., OTR/L Acute Rehabilitation Services Pager [phone] Office [phone]   Lucille Passy M 03/12/2020, 4:13 PM

## 2020-03-12 NOTE — TOC Initial Note (Signed)
Transition of Care [facility]) - Initial/Assessment Note    Patient Details  Name: [name] MRN: [id number] Date of Birth: 08/11/1951  [facility] CM/SW Contact:    Marilu Favre, RN Phone Number: 03/12/2020, 3:44 PM  Clinical Narrative:                  Spoke to patient at bedside. Confirmed face sheet information. Patient from home with husband. Per patient he can provide 24 hour assistance . Spoke to PT/OT they will recommend HHPT/OT left Medicare.gov home health agency list at bedside.   Recommending wheel chair , 3 in 1 , walker , tub transfer bench . NCM entered ered orders for DME. However, patient may have some DME she can borrow. Will follow up for [facility] agency chose and DME needs. Will need PTAR home  Expected Discharge Plan: [place] Barriers to Discharge: Continued Medical Work up   Patient Goals and CMS Choice Patient states their goals for this hospitalization and ongoing recovery are:: to return to home CMS Medicare.gov Compare Post Acute Care list provided to:: Patient Choice offered to / list presented to : Patient  Expected Discharge Plan and Services Expected Discharge Plan: [place]   Discharge Planning Services: CM Consult Post Acute Care Choice: Durable Medical Equipment, Home Health Living arrangements for the past 2 months: Single Family Home                           HH Arranged: OT, PT          Prior Living Arrangements/Services Living arrangements for the past 2 months: Single Family Home Lives with:: Spouse Patient language and need for interpreter reviewed:: Yes Do you feel safe going back to the place where you live?: Yes      Need for Family Participation in Patient Care: Yes (Comment) Care giver support system in place?: Yes (comment)   Criminal Activity/Legal Involvement Pertinent to Current Situation/Hospitalization: No - Comment as needed  Activities of Daily Living Home  Assistive Devices/Equipment: None ADL Screening (condition at time of admission) Patient's cognitive ability adequate to safely complete daily activities?: Yes Is the patient deaf or have difficulty hearing?: No Does the patient have difficulty seeing, even when wearing glasses/contacts?: No Does the patient have difficulty concentrating, remembering, or making decisions?: No Patient able to express need for assistance with ADLs?: Yes Does the patient have difficulty dressing or bathing?: No Independently performs ADLs?: Yes (appropriate for developmental age) Does the patient have difficulty walking or climbing stairs?: Yes Weakness of Legs: Left Weakness of Arms/Hands: None  Permission Sought/Granted   Permission granted to share information with : No              Emotional Assessment Appearance:: Appears stated age Attitude/Demeanor/Rapport: Engaged Affect (typically observed): Accepting Orientation: : Oriented to Self, Oriented to Place, Oriented to  Time, Oriented to Situation Alcohol / Substance Use: Not Applicable Psych Involvement: No (comment)  Admission diagnosis:  Tibial plateau fracture [S82.143A] At high risk for falls [Z91.81] Fall, initial encounter [W19.XXXA] Closed fracture of left tibial plateau, initial encounter [S82.142A] Tibial plateau fracture, left, closed, initial encounter [S82.142A] Patient Active Problem List   Diagnosis Date Noted  . Tibial plateau fracture 03/12/2020  . Elevated blood pressure reading 03/12/2020  . Tibial plateau fracture, left, closed, initial encounter 03/12/2020  . Chronic pain of both knees 01/29/2020  . Morbid obesity ([place])  01/29/2020  . Hypothyroidism 06/30/2019  . Basal cell carcinoma of nose 06/30/2019  . Colon cancer ([place]) 11/30/2011  . Hemorrhage of gastrointestinal tract 05/04/2011  . Esophageal varices ([place]) 06/12/2010  . Portal hypertension ([place]) 01/23/2008   PCP:  Elby Beck, FNP Pharmacy:    [id number] - [place], [address] [place] [address] Phone: [phone] Fax: [phone]     Social Determinants of Health (SDOH) Interventions    Readmission Risk Interventions No flowsheet data found.

## 2020-03-12 NOTE — Evaluation (Signed)
Physical Therapy Evaluation Patient Details Name: [name] MRN: [id number] DOB: 06-Jan-1951 Today's Date: 03/12/2020   History of Present Illness  This 69 y.o. female admitted after tripping on carpet and sustaining a fall.  She was found to have tibia plateau fx Lt LE (non operative).  PMH includes: CA of cecum, esophageal varices, portal HTN   Clinical Impression  Pt admitted with above diagnosis. Pt with NWB status on L LE and increased pain that limited mobility.  Required increased time, relaxation techniques, and cues for transfer techniques.  Pt currently with functional limitations due to the deficits listed below (see PT Problem List). She required mod A of 2 today for transfers but was largely limited by fear and pain , transfers improved with practice.  Pt with supportive family.  Largest barrier to return home is multiple steps to enter home.  She would need EMS transport home, but will need to find a way to safely exit house (ramp, sit and scoot , or spouse boost down in w/c). Pt will benefit from skilled PT to increase their independence and safety with mobility to allow discharge to the venue listed below.       Follow Up Recommendations Home health PT;Supervision/Assistance - 24 hour    Equipment Recommendations  Rolling walker with 5" wheels;Wheelchair cushion (measurements PT);Wheelchair (20");3in1 (PT);Other (comment)(elevating leg rest on w/c; shower bench; (discussed lift chair may also be good if they have access to one))    Recommendations for Other Services       Precautions / Restrictions Precautions Precautions: Fall Required Braces or Orthoses: Other Brace Other Brace: bledsoe brace - unlocked Restrictions Weight Bearing Restrictions: Yes LLE Weight Bearing: Non weight bearing      Mobility  Bed Mobility Overal bed mobility: Needs Assistance Bed Mobility: Supine to Sit;Sit to Supine     Supine to sit: Mod assist;+2 for physical assistance;+2 for  safety/equipment Sit to supine: Mod assist;+2 for physical assistance;+2 for safety/equipment   General bed mobility comments: pt requires verbal cues for problem solving and sequencing as well as assist for LEs and to lift trunk ; increased time and encouragment due to pain  Transfers Overall transfer level: Needs assistance   Transfers: Sit to/from Stand Sit to Stand: +2 physical assistance;+2 safety/equipment;Min assist;From elevated surface         General transfer comment: Attempted to stand from standard bed height, but pt unable to achieve standing.  With bed level elevated, she was able to move into standing x 2 with assist to boost hips and to steady .  Cues for hand placement and NWB L LE  Ambulation/Gait Ambulation/Gait assistance: Min assist;+2 safety/equipment;+2 physical assistance Gait Distance (Feet): 2 Feet Assistive device: Rolling walker (2 wheeled) Gait Pattern/deviations: Decreased stride length;Shuffle Gait velocity: decreased   General Gait Details: Pt pivoted and small hopped on R LE toward HOB; did well with NWB L LE  Stairs            Wheelchair Mobility    Modified Rankin (Stroke Patients Only)       Balance Overall balance assessment: Needs assistance Sitting-balance support: Feet supported Sitting balance-Leahy Scale: Good     Standing balance support: Bilateral upper extremity supported Standing balance-Leahy Scale: Poor Standing balance comment: reliant on bil. UE support                              Pertinent Vitals/Pain Pain Assessment: 0-10 Pain  Score: 8  Pain Location: Lt knee  Pain Descriptors / Indicators: Aching;Sharp;Grimacing;Guarding;Crying Pain Intervention(s): Limited activity within patient's tolerance;Monitored during session;RN gave pain meds during session;Repositioned;Relaxation;Other (comment)(8/10 pre, RN gave med 3/10 , up to 8/10 with movement but easing back off at rest)    [place] expects to be discharged to:: Private residence Living Arrangements: Spouse/significant other Available Help at Discharge: Family;Available 24 hours/day Type of Home: House Home Access: Stairs to enter   [employer] of Steps: 2 steps onto breezeway and 2 more into house: 2 of them have handrails and one of them is big; Front door has 5 steps has rails Home Layout: One level Home Equipment: Crutches;Cane - single point Additional Comments: has access to w/c but not sure if leg rest elevate    Prior Function Level of Independence: Independent         Comments: Other than having difficulty getting in/out shower she was independent; works as Nurse, learning disability ; no recent falls other than this one     Hand Dominance   Dominant Hand: Right    Extremity/Trunk Assessment   Upper Extremity Assessment Upper Extremity Assessment: Overall WFL for tasks assessed    Lower Extremity Assessment Lower Extremity Assessment: RLE deficits/detail;LLE deficits/detail RLE Deficits / Details: WFL LLE Deficits / Details: L ankle with normal ROM and 3/5 strength - not further tested; R hip and knee with limited ROM due to pain and strength 1/5 - limited testing due to pain; tended to keep knee extended    Cervical / Trunk Assessment Cervical / Trunk Assessment: Normal  Communication   Communication: No difficulties  Cognition Arousal/Alertness: Awake/alert Behavior During Therapy: WFL for tasks assessed/performed;Anxious Overall Cognitive Status: Within Functional Limits for tasks assessed                                        General Comments General comments (skin integrity, edema, etc.): Spouse present and very supportive.  Pt very fearful of pain and moving. Spent increased time educating on transfer techniques and discussing home needs.  Discussed that largest barrier is steps - could do EMS transport home, but would need to be able to get out of house if  needed     Exercises     Assessment/Plan    PT Assessment Patient needs continued PT services  PT Problem List Decreased strength;Decreased mobility;Decreased range of motion;Decreased coordination;Decreased knowledge of precautions;Decreased activity tolerance;Decreased balance;Decreased knowledge of use of DME;Pain       PT Treatment Interventions DME instruction;Therapeutic activities;Modalities;Gait training;Therapeutic exercise;Patient/family education;Stair training;Balance training;Functional mobility training    PT Goals (Current goals can be found in the Care Plan section)  Acute Rehab PT Goals Patient Stated Goal: to have less pain, and be able to go home  PT Goal Formulation: With patient/family Time For Goal Achievement: 03/26/20 Potential to Achieve Goals: Good    Frequency Min 5X/week   Barriers to discharge Inaccessible home environment      Co-evaluation PT/OT/SLP Co-Evaluation/Treatment: Yes Reason for Co-Treatment: For patient/therapist safety PT goals addressed during session: Mobility/safety with mobility;Balance;Proper use of DME OT goals addressed during session: Strengthening/ROM       AM-PAC PT "6 Clicks" Mobility  Outcome Measure Help needed turning from your back to your side while in a flat bed without using bedrails?: A Lot Help needed moving from lying on your back to sitting on the side of  a flat bed without using bedrails?: A Lot Help needed moving to and from a bed to a chair (including a wheelchair)?: A Lot Help needed standing up from a chair using your arms (e.g., wheelchair or bedside chair)?: A Lot Help needed to walk in hospital room?: A Lot Help needed climbing 3-5 steps with a railing? : Total 6 Click Score: 11    End of Session Equipment Utilized During Treatment: Gait belt;Other (comment)(bledsoe brace) Activity Tolerance: Patient limited by pain Patient left: in bed;with call bell/phone within reach;with family/visitor  present Nurse Communication: Mobility status PT Visit Diagnosis: Other abnormalities of gait and mobility (R26.89);History of falling (Z91.81);Muscle weakness (generalized) (M62.81);Pain Pain - Right/Left: Left Pain - part of body: Knee    Time: [id number] PT Time Calculation (min) (ACUTE ONLY): 60 min   Charges:   PT Evaluation $PT Eval Moderate Complexity: 1 Mod PT Treatments $Therapeutic Activity: 8-22 mins        Maggie Font, PT Acute Rehab Services Pager [phone] [facility] [phone] [facility] [phone]   Karlton Lemon 03/12/2020, 5:08 PM

## 2020-03-12 NOTE — Care Management (Signed)
    Durable Medical Equipment  (From admission, onward)         Start     Ordered   03/12/20 1528  For home use only DME 3 n 1  Once     03/12/20 1527   03/12/20 1527  For home use only DME Tub bench  Once    Comments: Tub transfer bench   03/12/20 1527   03/12/20 1526  For home use only DME Walker rolling  Once    Question Answer Comment  Walker: With 5 Inch Wheels   Patient needs a walker to treat with the following condition Closed fracture of lateral portion of left tibial plateau      03/12/20 1527   03/12/20 1524  For home use only DME lightweight manual wheelchair with seat cushion  Once    Comments: Patient suffers from Left lateral tibial plateau fracture secondary to a mechanical fall which impairs their ability to perform daily activities like ambulating  in the home.  A cane  will not resolve  issue with performing activities of daily living. A wheelchair will allow patient to safely perform daily activities. Patient is not able to propel themselves in the home using a standard weight wheelchair due to Left lateral tibial plateau fracture secondary to a mechanical fall. Patient can self propel in the lightweight wheelchair. Length of need 6 weeks . Accessories: elevating leg rests (ELRs), wheel locks, extensions and anti-tippers.  Back and seat cushion   Elevating leg rests   03/12/20 1527

## 2020-03-12 NOTE — ED Notes (Signed)
Report called to Jiles Garter RN

## 2020-03-12 NOTE — ED Notes (Signed)
Pt refused synthroid because she stated it didn't look like what she takes at home.

## 2020-03-12 NOTE — NC FL2 (Signed)
[place] LEVEL OF CARE SCREENING TOOL     IDENTIFICATION  Patient Name: [name] Birthdate: June 28, 1951 Sex: female Admission Date (Current Location): 03/11/2020  [facility] and [place] Number:  Herbalist and Address:  [facility], [place] [address], [place], [place] [address]      Provider Number: [id number]  Attending Physician Name and Address:  [place] Donnamarie Poag, DO  Relative Name and Phone Number:       Current Level of Care: Hospital Recommended Level of Care: [place] Prior Approval Number:    Date Approved/Denied:   PASRR Number: [id number]  Discharge Plan: SNF    Current Diagnoses: Patient Active Problem List   Diagnosis Date Noted  . Tibial plateau fracture 03/12/2020  . Elevated blood pressure reading 03/12/2020  . Tibial plateau fracture, left, closed, initial encounter 03/12/2020  . Chronic pain of both knees 01/29/2020  . Morbid obesity ([place]) 01/29/2020  . Hypothyroidism 06/30/2019  . Basal cell carcinoma of nose 06/30/2019  . Colon cancer ([place]) 11/30/2011  . Hemorrhage of gastrointestinal tract 05/04/2011  . Esophageal varices ([place]) 06/12/2010  . Portal hypertension ([place]) 01/23/2008    Orientation RESPIRATION BLADDER Height & Weight     Self, Time, Situation, Place  Normal Continent Weight: 250 lb (113.4 kg) Height:  5\' 4"  (162.6 cm)  BEHAVIORAL SYMPTOMS/MOOD NEUROLOGICAL BOWEL NUTRITION STATUS      Continent Diet(see discharge summary)  AMBULATORY STATUS COMMUNICATION OF NEEDS Skin   Extensive Assist Verbally Other (Comment)(NWB L leg x 6 weeks; MASD groin/abdomen/breasts)                       Personal Care Assistance Level of Assistance  Bathing, Feeding, Dressing Bathing Assistance: Maximum assistance Feeding assistance: Independent Dressing Assistance: Maximum assistance     Functional Limitations Info  Sight, Hearing, Speech Sight Info: Adequate Hearing Info:  Adequate Speech Info: Adequate    SPECIAL CARE FACTORS FREQUENCY  PT (By licensed PT), OT (By licensed OT)     PT Frequency: 5x week OT Frequency: 5x week            Contractures Contractures Info: Not present    Additional Factors Info  Code Status, Allergies Code Status Info: Full Code Allergies Info: Contrast Media (Iodinated Diagnostic Agents)           Current Medications (03/12/2020):  This is the current hospital active medication list Current Facility-Administered Medications  Medication Dose Route Frequency Provider Last Rate Last Admin  . acetaminophen (TYLENOL) tablet 650 mg  650 mg Oral Q6H PRN Shela Leff, MD       Or  . acetaminophen (TYLENOL) suppository 650 mg  650 mg Rectal Q6H PRN Shela Leff, MD      . cholecalciferol (VITAMIN D3) tablet 2,000 Units  2,000 Units Oral QHS Shela Leff, MD      . enoxaparin (LOVENOX) injection 50 mg  50 mg Subcutaneous Q24H [place] Donnamarie Poag, DO      . HYDROcodone-acetaminophen (NORCO/VICODIN) 5-325 MG per tablet 1 tablet  1 tablet Oral Q4H PRN [place] Eric J, DO   1 tablet at 03/12/20 0912  . levothyroxine (SYNTHROID) tablet 75 mcg  75 mcg Oral Q0600 Shela Leff, MD   75 mcg at 03/12/20 0551  . morphine 2 MG/ML injection 2 mg  2 mg Intravenous Q4H PRN Shela Leff, MD   2 mg at 03/12/20 1436  . multivitamin with minerals tablet 1 tablet  1 tablet Oral QHS Shela Leff, MD      . omega-3 acid ethyl esters (LOVAZA) capsule 1 g  1 g Oral QHS Shela Leff, MD      . ondansetron [facility]) injection 4 mg  4 mg Intravenous Q6H PRN [place] Eric J, DO   4 mg at 03/12/20 1216  . vitamin B-12 (CYANOCOBALAMIN) tablet 5,000 mcg  5,000 mcg Oral QHS Shela Leff, MD         Discharge Medications: Please see discharge summary for a list of discharge medications.  Relevant Imaging Results:  Relevant Lab Results:   Additional Information SS#[id number] [place],  [place]

## 2020-03-12 NOTE — Consult Note (Addendum)
Reason for Consult:Left tibia plateau fx Referring Physician: E [place] and Theotis Burrow, MD  [name] is an 69 y.o. female.  HPI: [name] was at work yesterday and tripped on the carpet and fell onto her knee. She had immediate left knee pain and could not get up or bear weight. [facility] to [facility] where a tibia plateau fx was diagnosed. She could not be discharged home from there as they did not have appropriate equipment and so was transferred to [facility] for further care. 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  Past Medical History:  Diagnosis Date   Cancer ([place])    cecum   Elevated liver function tests    Esophageal varices ([facility])    Hypothyroidism    Iron deficiency anemia    Liver disease    chemotherapy complication, per pt, shunts placed to bypass liver   Malignant neoplasm of cecum ([place])    Portal hypertension (H[facility])    Splenomegaly     Past Surgical History:  Procedure Laterality Date   ESOPHAGEAL VARICE LIGATION     HEMICOLECTOMY  01/08/03   LIVER SURGERY     shunts placed after chemo complication   SKIN FULL THICKNESS GRAFT N/A 09/12/2019   Procedure: debridement and FTSG to the nose from left upper arm;  Surgeon: Cindra Presume, MD;  Location: [facility];  Service: Plastics;  Laterality: N/A;  2 hours, please    Family History  Problem Relation Age of Onset   Arthritis Mother    Hearing loss Mother    Heart disease Mother    Hypertension Mother    Arthritis Father    Diabetes Father    Heart disease Father    Breast cancer Neg Hx     Social History:  reports that she has never smoked. She has never used smokeless tobacco. She reports that she does not drink alcohol or use drugs.  Allergies:  Allergies  Allergen Reactions   Contrast Media [Iodinated Diagnostic Agents] Hives    Medications: I have reviewed the patient's current medications.  Results for orders placed or performed during the hospital encounter of  03/11/20 (from the past 48 hour(s))  SARS CORONAVIRUS 2 (TAT 6-24 HRS) Nasopharyngeal Nasopharyngeal Swab     Status: None   Collection Time: 03/12/20 12:02 AM   Specimen: Nasopharyngeal Swab  Result Value Ref Range   SARS Coronavirus 2 NEGATIVE NEGATIVE    Comment: (NOTE) SARS-CoV-2 target nucleic acids are NOT DETECTED. The SARS-CoV-2 RNA is generally detectable in upper and lower respiratory specimens during the acute phase of infection. Negative results do not preclude SARS-CoV-2 infection, do not rule out co-infections with other pathogens, and should not be used as the sole basis for treatment or other patient management decisions. Negative results must be combined with clinical observations, patient history, and epidemiological information. The expected result is Negative. Fact Sheet for Patients: SugarRoll.be Fact Sheet for Healthcare Providers: https://www.woods-mathews.com/ This test is not yet approved or cleared by the [place] FDA and  has been authorized for detection and/or diagnosis of SARS-CoV-2 by FDA under an Emergency Use Authorization (EUA). This EUA will remain  in effect (meaning this test can be used) for the duration of the COVID-19 declaration under Section 56 4(b)(1) of the Act, 21 U.S.C. section 360bbb-3(b)(1), unless the authorization is terminated or revoked sooner. Performed at [facility], [place] [address], [place], [place] [address]   CBC with  Differential     Status: None   Collection Time: 03/12/20 12:10 AM  Result Value Ref Range   WBC 8.2 4.0 - 10.5 K/uL   RBC 4.12 3.87 - 5.11 MIL/uL   Hemoglobin 13.1 12.0 - 15.0 g/dL   HCT 39.9 36.0 - 46.0 %   MCV 96.8 80.0 - 100.0 fL   MCH 31.8 26.0 - 34.0 pg   MCHC 32.8 30.0 - 36.0 g/dL   RDW 14.3 11.5 - 15.5 %   Platelets 211 150 - 400 K/uL   nRBC 0.0 0.0 - 0.2 %   Neutrophils Relative % 79 %   Neutro Abs 6.5 1.7 - 7.7 K/uL   Lymphocytes Relative 12  %   Lymphs Abs 1.0 0.7 - 4.0 K/uL   Monocytes Relative 7 %   Monocytes Absolute 0.6 0.1 - 1.0 K/uL   Eosinophils Relative 1 %   Eosinophils Absolute 0.1 0.0 - 0.5 K/uL   Basophils Relative 1 %   Basophils Absolute 0.0 0.0 - 0.1 K/uL   Immature Granulocytes 0 %   Abs Immature Granulocytes 0.02 0.00 - 0.07 K/uL    Comment: Performed at [facility], [address] [place], [place] [address]  Comprehensive metabolic panel     Status: Abnormal   Collection Time: 03/12/20 12:10 AM  Result Value Ref Range   Sodium 138 135 - 145 mmol/L   Potassium 4.3 3.5 - 5.1 mmol/L   Chloride 105 98 - 111 mmol/L   CO2 21 (L) 22 - 32 mmol/L   Glucose, Bld 99 70 - 99 mg/dL    Comment: Glucose reference range applies only to samples taken after fasting for at least 8 hours.   BUN 11 8 - 23 mg/dL   Creatinine, Ser 0.71 0.44 - 1.00 mg/dL   Calcium 9.2 8.9 - 10.3 mg/dL   Total Protein 7.1 6.5 - 8.1 g/dL   Albumin 3.6 3.5 - 5.0 g/dL   AST 50 (H) 15 - 41 U/L   ALT 29 0 - 44 U/L   Alkaline Phosphatase 110 38 - 126 U/L   Total Bilirubin 1.4 (H) 0.3 - 1.2 mg/dL   GFR calc non Af Amer >60 >60 mL/min   GFR calc Af Amer >60 >60 mL/min   Anion gap 12 5 - 15    Comment: Performed at [facility] [address], [place], [place] [address]  HIV Antibody (routine testing w rflx)     Status: None   Collection Time: 03/12/20  4:45 AM  Result Value Ref Range   HIV Screen 4th Generation wRfx NON REACTIVE NON REACTIVE    Comment: Performed at [facility] [address] [place], [place] [address]    CT Knee Left Wo Contrast  Result Date: 03/12/2020 CLINICAL DATA:  Tibial plateau fracture EXAM: CT OF THE LEFT KNEE WITHOUT CONTRAST TECHNIQUE: Multidetector CT imaging of the left knee was performed according to the standard protocol. Multiplanar CT image reconstructions were also generated. COMPARISON:  Plain films today FINDINGS: There is a lateral tibial plateau fracture. Minimal depression and  displacement. Associated joint effusion with lipohemarthrosis. No subluxation or dislocation. No additional acute bony abnormality. IMPRESSION: Lateral tibial plateau fracture with lipohemarthrosis. Electronically Signed   By: Rolm Baptise M.D.   On: 03/12/2020 00:05   DG Knee AP/LAT W/Sunrise Left  Result Date: 03/11/2020 CLINICAL DATA:  History of osteoarthritis. Fell earlier today with anterior knee pain. EXAM: LEFT KNEE 3 VIEWS COMPARISON:  None. FINDINGS: Acute fracture of the lateral tibial plateau, depressed 2-3 mm. Lipohemarthrosis. No other fracture identified. Ordinary osteoarthritis of the weight-bearing compartments and patellofemoral joint. IMPRESSION: Acute depressed fracture of the lateral tibial plateau with lipohemarthrosis. Electronically Signed   By: Nelson Chimes M.D.   On: 03/11/2020 20:16    Review of Systems  HENT: Negative for ear discharge, ear pain, hearing loss and tinnitus.   Eyes: Negative for photophobia and pain.  Respiratory: Negative for cough and shortness of breath.   Cardiovascular: Negative for chest pain.  Gastrointestinal: Negative for abdominal pain, nausea and vomiting.  Genitourinary: Negative for dysuria, flank pain, frequency and urgency.  Musculoskeletal: Positive for arthralgias (Left knee). Negative for back pain, myalgias and neck pain.  Neurological: Negative for dizziness and headaches.  Hematological: Does not bruise/bleed easily.  Psychiatric/Behavioral: The patient is not nervous/anxious.    Blood pressure (!) 142/81, pulse 81, temperature 98.6 F (37 C), temperature source Oral, resp. rate 18, height 5\' 4"  (1.626 m), weight 113.4 kg, SpO2 95 %. Physical Exam  Constitutional: She appears well-developed and well-nourished. No distress.  HENT:  Head: Normocephalic and atraumatic.  Eyes: Conjunctivae are normal. Right eye exhibits no discharge. Left eye exhibits no discharge. No scleral icterus.  Cardiovascular: Normal rate and regular  rhythm.  Respiratory: Effort normal. No respiratory distress.  Musculoskeletal:     Cervical back: Normal range of motion.     Comments: LLE No traumatic wounds, ecchymosis, or rash  KI in place  No obvious knee or ankle effusion  Sens DPN, SPN, TN intact  Motor EHL, ext, flex, evers 5/5  DP 2+, PT 1+, 2+ NP edema (compared with 1+ RLE)  Neurological: She is alert.  Skin: Skin is warm and dry. She is not diaphoretic.  Psychiatric: She has a normal mood and affect. Her behavior is normal.    Assessment/Plan: Left tiba plateau fx -- Will plan initial trial of non-operative treatment in a KI with conversion to a hinged knee brace with unrestricted motion. She is to remain NWB for probably 6 weeks. She should f/u with Dr. Marcelino Scot in 2 weeks. Multiple medical problems including cancer of cecum status post hemicolectomy, liver disease as complication of chemotherapy with resulting portal hypertension and esophageal variceal bleeding status post TIPS, hypothyroidism, iron deficiency anemia, morbid obesity (BMI 42.91) -- Given particular comorbidities and lower extremity fx would recommend Lovenox 50mg  daily x21d for DVT prophylaxis or similar. Otherwise management per primary service.    Lisette Abu, PA-C Orthopedic Surgery [phone] 03/12/2020, 12:59 PM    As the orthopaedic surgeon on call at the direction of the primary service, I have personally reviewed and discussed in detail with Silvestre Gunner, PA-C, the patient's presentation, progress, and confirmed the examination findings; I have also reviewed and interpreted the x-rays and laboratory studies; and I formulated the plan for treatment which is outlined above, in addition to communicating with the primary service.  Altamese Spencer, MD [facility], [facility] [phone]

## 2020-03-12 NOTE — Progress Notes (Signed)
PROGRESS NOTE    [name]  [id number] DOB: 04-07-1951 DOA: 03/11/2020 PCP: Elby Beck, FNP    Brief Narrative:  [name] is a 69 y.o. female with medical history significant of cancer of cecum status post hemicolectomy, liver disease as complication of chemotherapy with resulting portal hypertension and esophageal variceal bleeding status post TIPS, hypothyroidism, iron deficiency anemia, morbid obesity (BMI 42.91) presenting with complaints of left knee pain secondary to a mechanical fall.  Patient states she was wearing tennis shoes and while walking on the carpet tripped and fell on her left knee.  Since then she has been having severe pain in her knee.  Denies injuring her head or any other injuries from the fall.  Denies dizziness, chest pain, shortness of breath, cough, nausea, vomiting, abdominal pain, or diarrhea.  In the ED, her blood pressure was elevated and borderline tachycardic.  Remainder of vital signs stable. X-ray of the left knee showing acute depressed fracture of the lateral tibial plateau with lipohemarthrosis. CT of the left knee showing lateral tibial plateau fracture with lipohemarthrosis. Knee immobilizer placed. ED provider consulted Dr. Marcelino Scot from orthopedics who recommended nonsurgical treatment.  He will see the patient in consultation.   Assessment & Plan:   Principal Problem:   Tibial plateau fracture Active Problems:   Hypothyroidism   Elevated blood pressure reading   Tibial plateau fracture, left, closed, initial encounter   Left lateral tibial plateau fracture secondary to a mechanical fall:  Patient presenting as a transfer from urgent care following mechanical fall. CT of the left knee showing lateral tibial plateau fracture with lipohemarthrosis. Orthopedics consulted, Dr. Marcelino Scot; recommending nonoperative management this time.   --Continue knee immobilizer/hinged brace per orthopedics --Weightbearing left lower extremity x6  weeks --Orthopedics recommends DVT prophylaxis with Lovenox 50 mg [place] daily x21 days given her comorbidities and lower extremity fracture --PT/OT evaluation: Pending --Continue pain control with Norco 5-325mg  PO q4h prn and morphine 2mg  IV q4h prn severe breakthrough pain --Outpatient follow-up with Dr. Marcelino Scot 2 weeks  Elevated blood pressure readings:  No documented history of hypertension.  Pain likely contributing.  Blood pressure significantly improved after pain management.   --BP 121/64 this morning --Continue to monitor blood pressure closely  Hypothyroidism: Continue home Synthroid 75 mcg p.o. daily  Obesity Body mass index is 42.91 kg/m.  Needs aggressive weight loss measures/lifestyle changes as this complicates all facets of care.   DVT prophylaxis: Lovenox Code Status: Full code Family Communication: Updated patient extensively at bedside, no family present  Disposition Plan:  Status is: Inpatient  Remains inpatient appropriate because:Ongoing active pain requiring inpatient pain management, Ongoing diagnostic testing needed not appropriate for outpatient work up, Unsafe d/c plan and Inpatient level of care appropriate due to severity of illness   Dispo: The patient is from: Home              Anticipated d/c is to: Home              Anticipated d/c date is: 1 day              Patient currently is not medically stable to d/c.   Consultants:   Orthopedics, Dr. Marcelino Scot  Procedures:   None  Antimicrobials:   None   Subjective: Patient seen and examined at bedside, in ED holding area.  Continues with left knee pain, although improved with IV pain medication.  Orthopedics has evaluated and plans nonoperative management with nonweightbearing left lower extremity and  pain control and therapy evaluation.  Patient without any other complaints or concerns at this time.  Denies headache, no chest pain, no shortness of breath, no abdominal pain.  No acute events overnight  per nursing staff.  Objective: Vitals:   03/12/20 0900 03/12/20 0915 03/12/20 0930 03/12/20 1102  BP: (!) 147/70 (!) 151/87 (!) 155/77 (!) 142/81  Pulse:    81  Resp:    18  Temp:    98.6 F (37 C)  TempSrc:    Oral  SpO2:    95%  Weight:      Height:       No intake or output data in the 24 hours ending 03/12/20 1331 Filed Weights   03/11/20 2213  Weight: 113.4 kg    Examination:  General exam: Appears calm and comfortable  Respiratory system: Clear to auscultation. Respiratory effort normal. Cardiovascular system: S1 & S2 heard, RRR. No JVD, murmurs, rubs, gallops or clicks. No pedal edema. Gastrointestinal system: Abdomen is nondistended, soft and nontender. No organomegaly or masses felt. Normal bowel sounds heard. Central nervous system: Alert and oriented. No focal neurological deficits. Extremities: Symmetric 5 x 5 power.  Left lower extremity with knee immobilizer in place Skin: No rashes, lesions or ulcers Psychiatry: Judgement and insight appear normal. Mood & affect appropriate.     Data Reviewed: I have personally reviewed following labs and imaging studies  CBC: Recent Labs  Lab 03/12/20 0010  WBC 8.2  NEUTROABS 6.5  HGB 13.1  HCT 39.9  MCV 96.8  PLT 123456   Basic Metabolic Panel: Recent Labs  Lab 03/12/20 0010  NA 138  K 4.3  CL 105  CO2 21*  GLUCOSE 99  BUN 11  CREATININE 0.71  CALCIUM 9.2   GFR: Estimated Creatinine Clearance: 83.1 mL/min (by C-G formula based on SCr of 0.71 mg/dL). Liver Function Tests: Recent Labs  Lab 03/12/20 0010  AST 50*  ALT 29  ALKPHOS 110  BILITOT 1.4*  PROT 7.1  ALBUMIN 3.6   No results for input(s): LIPASE, AMYLASE in the last 168 hours. No results for input(s): AMMONIA in the last 168 hours. Coagulation Profile: No results for input(s): INR, PROTIME in the last 168 hours. Cardiac Enzymes: No results for input(s): CKTOTAL, CKMB, CKMBINDEX, TROPONINI in the last 168 hours. BNP (last 3 results) No  results for input(s): PROBNP in the last 8760 hours. HbA1C: No results for input(s): HGBA1C in the last 72 hours. CBG: No results for input(s): GLUCAP in the last 168 hours. Lipid Profile: No results for input(s): CHOL, HDL, LDLCALC, TRIG, CHOLHDL, LDLDIRECT in the last 72 hours. Thyroid Function Tests: No results for input(s): TSH, T4TOTAL, FREET4, T3FREE, THYROIDAB in the last 72 hours. Anemia Panel: No results for input(s): VITAMINB12, FOLATE, FERRITIN, TIBC, IRON, RETICCTPCT in the last 72 hours. Sepsis Labs: No results for input(s): PROCALCITON, LATICACIDVEN in the last 168 hours.  Recent Results (from the past 240 hour(s))  SARS CORONAVIRUS 2 (TAT 6-24 HRS) Nasopharyngeal Nasopharyngeal Swab     Status: None   Collection Time: 03/12/20 12:02 AM   Specimen: Nasopharyngeal Swab  Result Value Ref Range Status   SARS Coronavirus 2 NEGATIVE NEGATIVE Final    Comment: (NOTE) SARS-CoV-2 target nucleic acids are NOT DETECTED. The SARS-CoV-2 RNA is generally detectable in upper and lower respiratory specimens during the acute phase of infection. Negative results do not preclude SARS-CoV-2 infection, do not rule out co-infections with other pathogens, and should not be used as the sole  basis for treatment or other patient management decisions. Negative results must be combined with clinical observations, patient history, and epidemiological information. The expected result is Negative. Fact Sheet for Patients: SugarRoll.be Fact Sheet for Healthcare Providers: https://www.woods-mathews.com/ This test is not yet approved or cleared by the [place] FDA and  has been authorized for detection and/or diagnosis of SARS-CoV-2 by FDA under an Emergency Use Authorization (EUA). This EUA will remain  in effect (meaning this test can be used) for the duration of the COVID-19 declaration under Section 56 4(b)(1) of the Act, 21 U.S.C. section  360bbb-3(b)(1), unless the authorization is terminated or revoked sooner. Performed at [facility], [place] [address], [place], [place] [address]          Radiology Studies: CT Knee Left Wo Contrast  Result Date: 03/12/2020 CLINICAL DATA:  Tibial plateau fracture EXAM: CT OF THE LEFT KNEE WITHOUT CONTRAST TECHNIQUE: Multidetector CT imaging of the left knee was performed according to the standard protocol. Multiplanar CT image reconstructions were also generated. COMPARISON:  Plain films today FINDINGS: There is a lateral tibial plateau fracture. Minimal depression and displacement. Associated joint effusion with lipohemarthrosis. No subluxation or dislocation. No additional acute bony abnormality. IMPRESSION: Lateral tibial plateau fracture with lipohemarthrosis. Electronically Signed   By: Rolm Baptise M.D.   On: 03/12/2020 00:05   DG Knee AP/LAT W/Sunrise Left  Result Date: 03/11/2020 CLINICAL DATA:  History of osteoarthritis. Fell earlier today with anterior knee pain. EXAM: LEFT KNEE 3 VIEWS COMPARISON:  None. FINDINGS: Acute fracture of the lateral tibial plateau, depressed 2-3 mm. Lipohemarthrosis. No other fracture identified. Ordinary osteoarthritis of the weight-bearing compartments and patellofemoral joint. IMPRESSION: Acute depressed fracture of the lateral tibial plateau with lipohemarthrosis. Electronically Signed   By: Nelson Chimes M.D.   On: 03/11/2020 20:16        Scheduled Meds: . cholecalciferol  2,000 Units Oral QHS  . levothyroxine  75 mcg Oral Q0600  . multivitamin with minerals  1 tablet Oral QHS  . omega-3 acid ethyl esters  1 g Oral QHS  . vitamin B-12  5,000 mcg Oral QHS   Continuous Infusions:   LOS: 0 days    Time spent: 36 minutes spent on chart review, discussion with nursing staff, consultants, updating family and interview/physical exam; more than 50% of that time was spent in counseling and/or coordination of care.    Ariea Rochin J [place]  DO [facility] Available via Epic secure chat 7am-7pm After these hours, please refer to coverage provider listed on [email or web address] 03/12/2020, 1:31 PM

## 2020-03-13 MED ORDER — OXYCODONE-ACETAMINOPHEN 5-325 MG PO TABS: 1.0000 | ORAL_TABLET | ORAL | Status: DC | PRN

## 2020-03-13 MED ORDER — PROMETHAZINE HCL 25 MG/ML IJ SOLN
6.2500 mg | Freq: Four times a day (QID) | INTRAMUSCULAR | Status: DC | PRN
  Administered 2020-03-13 (×2): 6.25 mg via INTRAVENOUS
  Filled 2020-03-13 (×2): qty 1

## 2020-03-13 MED ORDER — IBUPROFEN 400 MG PO TABS
400.0000 mg | ORAL_TABLET | Freq: Four times a day (QID) | ORAL | Status: DC
  Administered 2020-03-13 – 2020-03-14 (×5): 400 mg via ORAL
  Filled 2020-03-13 (×6): qty 1

## 2020-03-13 NOTE — Progress Notes (Addendum)
PROGRESS NOTE    [name]  [id number] DOB: 08-08-1951 DOA: 03/11/2020 PCP: Elby Beck, FNP    Brief Narrative:   69 y.o. female with medical history significant of cancer of cecum status post hemicolectomy, liver disease as complication of chemotherapy with resulting portal hypertension and esophageal variceal bleeding status post TIPS, hypothyroidism, iron deficiency anemia, morbid obesity (BMI 42.91) presenting with complaints of left knee pain secondary to a mechanical fall.  Patient states she was wearing tennis shoes and while walking on the carpet tripped and fell on her left knee.  Since then she has been having severe pain in her knee.  Denies injuring her head or any other injuries from the fall.  Denies dizziness, chest pain, shortness of breath, cough, nausea, vomiting, abdominal pain, or diarrhea.  In the ED, her blood pressure was elevated and borderline tachycardic.  Remainder of vital signs stable. X-ray of the left knee showing acute depressed fracture of the lateral tibial plateau with lipohemarthrosis. CT of the left knee showing lateral tibial plateau fracture with lipohemarthrosis. Knee immobilizer placed. ED provider consulted Dr. Marcelino Scot from orthopedics who recommended nonsurgical treatment.     Assessment & Plan:   Principal Problem:   Tibial plateau fracture Active Problems:   Hypothyroidism   Elevated blood pressure reading   Tibial plateau fracture, left, closed, initial encounter   Left lateral tibial plateau fracture secondary to a mechanical fall:  Patient presenting as a transfer from urgent care following mechanical fall. CT of the left knee showing lateral tibial plateau fracture with lipohemarthrosis. Orthopedics consulted, Dr. Marcelino Scot; recommending nonoperative management this time.   --Continue knee immobilizer/hinged brace per orthopedics --Weightbearing left lower extremity x6 weeks --Orthopedics recommends DVT prophylaxis with Lovenox  50 mg [place] daily x21 days given her comorbidities and lower extremity fracture --PT/OT evaluation: Pending --Change Norco-->Percocet PO q4h prn, for breakthrough add ibuprofen And morphine 2mg  IV q4h prn severe breakthrough pain --Outpatient follow-up with Dr. Marcelino Scot 2 weeks  Elevated blood pressure readings:  No documented history of hypertension.  Pain likely contributing.  Blood pressure significantly improved after pain management.   --BP 121/64 this morning --Continue to monitor blood pressure closely  Hypothyroidism: Continue home Synthroid 75 mcg p.o. daily  Obesity Body mass index is 42.91 kg/m.  Needs aggressive weight loss measures/lifestyle changes as this complicates all facets of care.   DVT prophylaxis: Lovenox Code Status: Full code Family Communication:  no family present Disposition Plan:  Status is: Inpatient  Remains inpatient appropriate because:Ongoing active pain requiring inpatient pain management, Ongoing diagnostic testing needed not appropriate for outpatient work up, Unsafe d/c plan and Inpatient level of care appropriate due to severity of illness   Dispo: The patient is from: Home              Anticipated d/c is to: Home              Anticipated d/c date is: 1 day              Patient currently is not medically stable to d/c.  DME required including home health 3 and 1 wheelchair rolling walker etc. have been ordered   Consultants:   Orthopedics, Dr. Marcelino Scot  Procedures:   None  Antimicrobials:   None   Subjective: Had some moderate to severe nausea this morning and pain is almost 8 out of 10 with minimal ambulation She is now ready for discharge Otherwise she understands that she will need follow-up in the  outpatient  Objective: Vitals:   03/12/20 1625 03/12/20 2221 03/13/20 0514 03/13/20 1234  BP: (!) 150/75 123/64 (!) 142/81 (!) 157/83  Pulse: 77 75 75 85  Resp: 18 16 17 18   Temp: 97.6 F (36.4 C) 97.7 F (36.5 C) 97.6 F (36.4  C) 97.9 F (36.6 C)  TempSrc: Oral Oral Oral Oral  SpO2: 95% 96% 93% 94%  Weight:      Height:        Intake/Output Summary (Last 24 hours) at 03/13/2020 1443 Last data filed at 03/13/2020 0524 Gross per 24 hour  Intake 240 ml  Output 400 ml  Net -160 ml   Filed Weights   03/11/20 2213  Weight: 113.4 kg    Examination:  Alert coherent no distress EOMI NCAT S1-S2 no murmur rub or gallop Chest is clear she is obese Abdomen is soft no rebound no guarding Knee immobilizer on the left lower extremity Able to wiggle toes Skin is not broken No rash Neurologically intact Mallampati 4    Data Reviewed: I have personally reviewed following labs and imaging studies  CBC: Recent Labs  Lab 03/12/20 0010  WBC 8.2  NEUTROABS 6.5  HGB 13.1  HCT 39.9  MCV 96.8  PLT 123456   Basic Metabolic Panel: Recent Labs  Lab 03/12/20 0010  NA 138  K 4.3  CL 105  CO2 21*  GLUCOSE 99  BUN 11  CREATININE 0.71  CALCIUM 9.2   GFR: Estimated Creatinine Clearance: 83.1 mL/min (by C-G formula based on SCr of 0.71 mg/dL). Liver Function Tests: Recent Labs  Lab 03/12/20 0010  AST 50*  ALT 29  ALKPHOS 110  BILITOT 1.4*  PROT 7.1  ALBUMIN 3.6   No results for input(s): LIPASE, AMYLASE in the last 168 hours. No results for input(s): AMMONIA in the last 168 hours. Coagulation Profile: No results for input(s): INR, PROTIME in the last 168 hours. Cardiac Enzymes: No results for input(s): CKTOTAL, CKMB, CKMBINDEX, TROPONINI in the last 168 hours. BNP (last 3 results) No results for input(s): PROBNP in the last 8760 hours. HbA1C: No results for input(s): HGBA1C in the last 72 hours. CBG: No results for input(s): GLUCAP in the last 168 hours. Lipid Profile: No results for input(s): CHOL, HDL, LDLCALC, TRIG, CHOLHDL, LDLDIRECT in the last 72 hours. Thyroid Function Tests: No results for input(s): TSH, T4TOTAL, FREET4, T3FREE, THYROIDAB in the last 72 hours. Anemia Panel: No  results for input(s): VITAMINB12, FOLATE, FERRITIN, TIBC, IRON, RETICCTPCT in the last 72 hours. Sepsis Labs: No results for input(s): PROCALCITON, LATICACIDVEN in the last 168 hours.  Recent Results (from the past 240 hour(s))  SARS CORONAVIRUS 2 (TAT 6-24 HRS) Nasopharyngeal Nasopharyngeal Swab     Status: None   Collection Time: 03/12/20 12:02 AM   Specimen: Nasopharyngeal Swab  Result Value Ref Range Status   SARS Coronavirus 2 NEGATIVE NEGATIVE Final    Comment: (NOTE) SARS-CoV-2 target nucleic acids are NOT DETECTED. The SARS-CoV-2 RNA is generally detectable in upper and lower respiratory specimens during the acute phase of infection. Negative results do not preclude SARS-CoV-2 infection, do not rule out co-infections with other pathogens, and should not be used as the sole basis for treatment or other patient management decisions. Negative results must be combined with clinical observations, patient history, and epidemiological information. The expected result is Negative. Fact Sheet for Patients: SugarRoll.be Fact Sheet for Healthcare Providers: https://www.woods-mathews.com/ This test is not yet approved or cleared by the [place] FDA and  has been authorized for detection and/or diagnosis of SARS-CoV-2 by FDA under an Emergency Use Authorization (EUA). This EUA will remain  in effect (meaning this test can be used) for the duration of the COVID-19 declaration under Section 56 4(b)(1) of the Act, 21 U.S.C. section 360bbb-3(b)(1), unless the authorization is terminated or revoked sooner. Performed at [facility], [place] [address], [place], [place] [address]          Radiology Studies: CT Knee Left Wo Contrast  Result Date: 03/12/2020 CLINICAL DATA:  Tibial plateau fracture EXAM: CT OF THE LEFT KNEE WITHOUT CONTRAST TECHNIQUE: Multidetector CT imaging of the left knee was performed according to the standard protocol.  Multiplanar CT image reconstructions were also generated. COMPARISON:  Plain films today FINDINGS: There is a lateral tibial plateau fracture. Minimal depression and displacement. Associated joint effusion with lipohemarthrosis. No subluxation or dislocation. No additional acute bony abnormality. IMPRESSION: Lateral tibial plateau fracture with lipohemarthrosis. Electronically Signed   By: Rolm Baptise M.D.   On: 03/12/2020 00:05   DG Knee AP/LAT W/Sunrise Left  Result Date: 03/11/2020 CLINICAL DATA:  History of osteoarthritis. Fell earlier today with anterior knee pain. EXAM: LEFT KNEE 3 VIEWS COMPARISON:  None. FINDINGS: Acute fracture of the lateral tibial plateau, depressed 2-3 mm. Lipohemarthrosis. No other fracture identified. Ordinary osteoarthritis of the weight-bearing compartments and patellofemoral joint. IMPRESSION: Acute depressed fracture of the lateral tibial plateau with lipohemarthrosis. Electronically Signed   By: Nelson Chimes M.D.   On: 03/11/2020 20:16        Scheduled Meds: . cholecalciferol  2,000 Units Oral QHS  . enoxaparin (LOVENOX) injection  50 mg Subcutaneous Q24H  . levothyroxine  75 mcg Oral Q0600  . multivitamin with minerals  1 tablet Oral QHS  . omega-3 acid ethyl esters  1 g Oral QHS  . vitamin B-12  5,000 mcg Oral QHS   Continuous Infusions:   LOS: 1 day    Time spent: 22 minutes spent on chart review, discussion with nursing staff, consultants, updating family and interview/physical exam; more than 50% of that time was spent in counseling and/or coordination of care.   Verneita Griffes, MD [facility] 3:03 PM

## 2020-03-13 NOTE — Progress Notes (Signed)
PT Progress Note for Charges    03/13/20 1640  PT Visit Information  Last PT Received On 03/13/20  PT General Charges  $$ ACUTE PT VISIT 1 Visit  PT Treatments  $Therapeutic Activity 23-37 mins  Anastasio Champion, DPT  Acute Rehabilitation Services Pager [phone] Office [phone]

## 2020-03-13 NOTE — Progress Notes (Signed)
Occupational Therapy Treatment Patient Details Name: [name] MRN: [id number] DOB: July 13, 1951 Today's Date: 03/13/2020    History of present illness This 69 y.o. female admitted after tripping on carpet and sustaining a fall.  She was found to have tibia plateau fx Lt LE (non operative).  PMH includes: CA of cecum, esophageal varices, portal HTN    OT comments  Pt progressing to OOB activity. Pain level: 6/10 at rest, walking, moving 8/10 with momentum bed mobility spouse present throughout session. Pt requiring increased assist for LB ADL as pt unable to reach LLE and pain too severe to attempt RLE sock donning. Pt minA +2 for sit to stand; minA +2 for stand pivot taking 8 steps to recliner; not hopping, but LLE kept up slightly and heavy reliance on RW. Pt's spouse intersted in purchasing tub transfer bench for safety at home. W/C mobility/transfers are in pt's near future and spoke about EMS travel for home instead of pt's spouse problem solving to use w/c for 5 steps without a ramp. Pt would greatly benefit from continued OT skilled services. OT following acutely.   Follow Up Recommendations  Home health OT;Supervision/Assistance - 24 hour    Equipment Recommendations  3 in 1 bedside commode;Tub/shower bench;Wheelchair (measurements OT);Wheelchair cushion (measurements OT)    Recommendations for Other Services      Precautions / Restrictions Precautions Precautions: Fall;Other (comment) Precaution Comments: NWB LLE Required Braces or Orthoses: Other Brace Other Brace: bledsoe brace - unlocked Restrictions Weight Bearing Restrictions: Yes LLE Weight Bearing: Non weight bearing       Mobility Bed Mobility Overal bed mobility: Needs Assistance Bed Mobility: Supine to Sit     Supine to sit: Min assist Sit to supine: Min guard   General bed mobility comments: MinA to elevate trunk and scoot to EOB  Transfers Overall transfer level: Needs assistance Equipment used:  Rolling walker (2 wheeled) Transfers: Sit to/from [facility] Sit to Stand: Min assist;+2 physical assistance Stand pivot transfers: Min assist;+2 physical assistance;+2 safety/equipment       General transfer comment: Pt minA +2 for sit to stand; minA +2 for stand pivot taking 8 steps to recliner; not hopping, but LLE kept up slightly and heavy reliance on R    Balance Overall balance assessment: Needs assistance Sitting-balance support: No upper extremity supported;Feet supported Sitting balance-Leahy Scale: Good     Standing balance support: Bilateral upper extremity supported;During functional activity Standing balance-Leahy Scale: Poor Standing balance comment: heavy reliance on Bil UE support                           ADL either performed or assessed with clinical judgement   ADL Overall ADL's : Needs assistance/impaired     Grooming: Set up;Sitting               Lower Body Dressing: Maximal assistance;Total assistance;Sitting/lateral leans;Sit to/from stand               Functional mobility during ADLs: Minimal assistance;+2 for physical assistance;Rolling walker General ADL Comments: Pt requiring increased assist for LB ADL as pt unable to reach LLE and pain too severe to attempt RLE sock donning     Vision       Perception     Praxis      Cognition Arousal/Alertness: Awake/alert Behavior During Therapy: [facility] for tasks assessed/performed;Anxious Overall Cognitive Status: Within Functional Limits for tasks assessed  Exercises     Shoulder Instructions       General Comments Supportive spouse in room    Pertinent Vitals/ Pain       Pain Assessment: 0-10 Pain Score: 6  Pain Location: L knee Pain Descriptors / Indicators: Aching;Discomfort;Grimacing Pain Intervention(s): Premedicated before session;Repositioned  Home Living                                           Prior Functioning/Environment              Frequency  Min 2X/week        Progress Toward Goals  OT Goals(current goals can now be found in the care plan section)  Progress towards OT goals: Progressing toward goals  Acute Rehab OT Goals Patient Stated Goal: to have less pain, and be able to go home  OT Goal Formulation: With patient/family Time For Goal Achievement: 03/26/20 Potential to Achieve Goals: Good ADL Goals Pt Will Perform Upper Body Bathing: with set-up;sitting Pt Will Perform Lower Body Bathing: with mod assist;sit to/from stand;with adaptive equipment Pt Will Perform Upper Body Dressing: with set-up;sitting Pt Will Perform Lower Body Dressing: with mod assist;sit to/from stand;with adaptive equipment Pt Will Transfer to Toilet: with min guard assist;stand pivot transfer;bedside commode Pt Will Perform Toileting - Clothing Manipulation and hygiene: with min guard assist;sitting/lateral leans;with adaptive equipment Pt Will Perform Tub/Shower Transfer: with min assist;shower seat;Stand pivot transfer;rolling walker  Plan Discharge plan remains appropriate    Co-evaluation                 AM-PAC OT "6 Clicks" Daily Activity     Outcome Measure   Help from another person eating meals?: None Help from another person taking care of personal grooming?: A Little Help from another person toileting, which includes using toliet, bedpan, or urinal?: Total Help from another person bathing (including washing, rinsing, drying)?: A Lot Help from another person to put on and taking off regular upper body clothing?: A Lot Help from another person to put on and taking off regular lower body clothing?: Total 6 Click Score: 13    End of Session Equipment Utilized During Treatment: Gait belt;Rolling walker  OT Visit Diagnosis: Pain Pain - Right/Left: Left Pain - part of body: Knee   Activity Tolerance Patient tolerated treatment  well;Patient limited by pain   Patient Left in chair;with call bell/phone within reach;with family/visitor present   Nurse Communication Mobility status;Weight bearing status;Precautions        Time: 1100-1141 OT Time Calculation (min): 41 min  Charges: OT General Charges $OT Visit: 1 Visit OT Treatments $Self Care/Home Management : 8-22 mins $Therapeutic Activity: 23-37 mins  Jefferey Pica, OTR/L Acute Rehabilitation Services Pager: [phone] Office: [phone]    Terrel Nesheiwat C 03/13/2020, 4:55 PM

## 2020-03-13 NOTE — Progress Notes (Signed)
Physical Therapy Treatment Patient Details Name: [name] MRN: [id number] DOB: August 17, 1951 Today's Date: 03/13/2020    History of Present Illness This 69 y.o. female admitted after tripping on carpet and sustaining a fall.  She was found to have tibia plateau fx Lt LE (non operative).  PMH includes: CA of cecum, esophageal varices, portal HTN     PT Comments    Patient is making slow progress with functional mobility. She was able to manage her L LE for bed mobility using a gait belt and inc time and effort. She was able to stand from EOB x2 with RW and modA with cues for proper sequencing and using momentum. She was unable to stand on third attempt due to fatigue, pt visibly upset but reassured that she has the ability to stand with assistance. She was able to pivot towards chair and back to bed with minA. Pt demonstrated good ability to maintain L LE NWB status throughout session. Plan to complete transfer training with pt's husband and/or children at future sessions to maximize safety and mobility. Pt will likely need EMS transfer home as pt has approx. 5 steps to enter home and is unable to effectively hop on R LE at this time.  Pt would continue to benefit from skilled physical therapy services at this time while admitted and after d/c to address the below listed limitations in order to improve overall safety and independence with functional mobility.   Follow Up Recommendations  Home health PT;Supervision/Assistance - 24 hour     Equipment Recommendations  Rolling walker with 5" wheels;Wheelchair cushion (measurements PT);Wheelchair (measurements PT);3in1 (PT);Hospital bed;Other (comment)(elevating leg rest on w/c. hospital bed rental d/t waterbed)    Recommendations for Other Services       Precautions / Restrictions Precautions Precautions: Fall;Other (comment) Precaution Comments: NWB LLE Required Braces or Orthoses: Other Brace Other Brace: bledsoe brace -  unlocked Restrictions Weight Bearing Restrictions: Yes LLE Weight Bearing: Non weight bearing    Mobility  Bed Mobility Overal bed mobility: Needs Assistance Bed Mobility: Supine to Sit;Sit to Supine     Supine to sit: Min guard Sit to supine: Min guard   General bed mobility comments: Pt able to manage L LE with use of gait belt with inc time, effort, and encouragement  Transfers Overall transfer level: Needs assistance Equipment used: Rolling walker (2 wheeled) Transfers: Sit to/from [facility] Sit to Stand: Mod assist Stand pivot transfers: Min assist       General transfer comment: Able to stand from EOB x2 with modA and RW from standard bed height. Heavy cues for proper sequencing and hand placement. Unsuccessful 3rd attempt. Inc pain to 7/10. Pt able to pivot with minA for safety.  Ambulation/Gait             General Gait Details: Pt pivoted R towards chair and then L to [facility] while maintaining L LE NWB precautions. Pt able to take small 'hop' on R LE statically   Stairs             Wheelchair Mobility    Modified Rankin (Stroke Patients Only)       Balance Overall balance assessment: Needs assistance Sitting-balance support: No upper extremity supported;Feet supported Sitting balance-Leahy Scale: Good     Standing balance support: Bilateral upper extremity supported;During functional activity Standing balance-Leahy Scale: Poor Standing balance comment: heavy reliance on Bil UE support  Cognition Arousal/Alertness: Awake/alert Behavior During Therapy: WFL for tasks assessed/performed;Anxious Overall Cognitive Status: Within Functional Limits for tasks assessed                                        Exercises      General Comments General comments (skin integrity, edema, etc.): Pt visibly upset upon failed final STS attempt- reassured she can do it (she demonstrated it  previously)      Pertinent Vitals/Pain Pain Assessment: 0-10 Pain Score: 5  Pain Location: L knee Pain Descriptors / Indicators: Aching;Discomfort;Grimacing Pain Intervention(s): Premedicated before session;Limited activity within patient's tolerance    Home Living                      Prior Function            PT Goals (current goals can now be found in the care plan section) Acute Rehab PT Goals PT Goal Formulation: With patient/family Time For Goal Achievement: 03/26/20 Potential to Achieve Goals: Good Progress towards PT goals: Progressing toward goals    Frequency    Min 5X/week      PT Plan Current plan remains appropriate    Co-evaluation              AM-PAC PT "6 Clicks" Mobility   Outcome Measure  Help needed turning from your back to your side while in a flat bed without using bedrails?: A Lot Help needed moving from lying on your back to sitting on the side of a flat bed without using bedrails?: A Little Help needed moving to and from a bed to a chair (including a wheelchair)?: A Lot Help needed standing up from a chair using your arms (e.g., wheelchair or bedside chair)?: A Lot Help needed to walk in hospital room?: A Lot Help needed climbing 3-5 steps with a railing? : Total 6 Click Score: 12    End of Session Equipment Utilized During Treatment: Gait belt;Other (comment)(bledsoe brace) Activity Tolerance: Patient tolerated treatment well Patient left: in bed;with call bell/phone within reach;with bed alarm set Nurse Communication: Mobility status PT Visit Diagnosis: Other abnormalities of gait and mobility (R26.89);History of falling (Z91.81);Muscle weakness (generalized) (M62.81);Pain Pain - Right/Left: Left Pain - part of body: Knee     Time: [id number] PT Time Calculation (min) (ACUTE ONLY): 30 min  Charges:  $Therapeutic Activity: (P) 23-37 mins                     [name], SPT Acute Rehab   [id number]    [name] 03/13/2020, 4:41 PM

## 2020-03-14 LAB — CBC WITH DIFFERENTIAL/PLATELET
Abs Immature Granulocytes: 0.01 10*3/uL (ref 0.00–0.07)
Basophils Absolute: 0 10*3/uL (ref 0.0–0.1)
Basophils Relative: 1 %
Eosinophils Absolute: 0.3 10*3/uL (ref 0.0–0.5)
Eosinophils Relative: 7 %
HCT: 37.8 % (ref 36.0–46.0)
Hemoglobin: 12.1 g/dL (ref 12.0–15.0)
Immature Granulocytes: 0 %
Lymphocytes Relative: 31 %
Lymphs Abs: 1.1 10*3/uL (ref 0.7–4.0)
MCH: 31.8 pg (ref 26.0–34.0)
MCHC: 32 g/dL (ref 30.0–36.0)
MCV: 99.5 fL (ref 80.0–100.0)
Monocytes Absolute: 0.3 10*3/uL (ref 0.1–1.0)
Monocytes Relative: 9 %
Neutro Abs: 1.8 10*3/uL (ref 1.7–7.7)
Neutrophils Relative %: 52 %
Platelets: 173 10*3/uL (ref 150–400)
RBC: 3.8 MIL/uL — ABNORMAL LOW (ref 3.87–5.11)
RDW: 14.6 % (ref 11.5–15.5)
WBC: 3.6 10*3/uL — ABNORMAL LOW (ref 4.0–10.5)
nRBC: 0 % (ref 0.0–0.2)

## 2020-03-14 LAB — BASIC METABOLIC PANEL
Anion gap: 7 (ref 5–15)
BUN: 12 mg/dL (ref 8–23)
CO2: 28 mmol/L (ref 22–32)
Calcium: 8.8 mg/dL — ABNORMAL LOW (ref 8.9–10.3)
Chloride: 104 mmol/L (ref 98–111)
Creatinine, Ser: 0.88 mg/dL (ref 0.44–1.00)
GFR calc Af Amer: >60 mL/min (ref >60)
GFR calc non Af Amer: >60 mL/min (ref >60)
Glucose, Bld: 81 mg/dL (ref 70–99)
Potassium: 4 mmol/L (ref 3.5–5.1)
Sodium: 139 mmol/L (ref 135–145)

## 2020-03-14 MED ORDER — IBUPROFEN 400 MG PO TABS: 400.0000 mg | ORAL_TABLET | Freq: Four times a day (QID) | ORAL | 0 refills | Status: DC

## 2020-03-14 MED ORDER — ENOXAPARIN SODIUM 60 MG/0.6ML ~~LOC~~ SOLN: 50.0000 mg | SUBCUTANEOUS | 0 refills | Status: DC

## 2020-03-14 MED ORDER — OXYCODONE-ACETAMINOPHEN 5-325 MG PO TABS: 1.0000 | ORAL_TABLET | ORAL | 0 refills | Status: DC | PRN

## 2020-03-14 NOTE — Discharge Summary (Signed)
Physician Discharge Summary  Chieko Pross [id number] DOB: [birth year] DOA: 03/11/2020  PCP: Elby Beck, FNP  Admit date: 03/11/2020 Discharge date: 03/14/2020  Time spent:45 minutes  Recommendations for Outpatient Follow-up:  1. need HH and equipment 2. Need cbc and cme t1 week 3. Need follow up OP with ortho  Discharge Diagnoses:  Principal Problem:   Tibial plateau fracture Active Problems:   Hypothyroidism   Elevated blood pressure reading   Tibial plateau fracture, left, closed, initial encounter   Discharge Condition: improved  Diet recommendation: hh  Filed Weights   03/11/20 2213  Weight: 113.4 kg    History of present illness:  69 y.o.femalewith medical history significant ofcancer of cecum status post hemicolectomy, liver disease as complication of chemotherapy with resulting portal hypertension and esophageal variceal bleeding status post TIPS, hypothyroidism, iron deficiency anemia, morbid obesity (BMI 42.91)presenting with complaints of left knee pain secondary to a mechanical fall.Patient states she was wearing tennis shoes and while walking on the carpet tripped and fell on her left knee. Since then she has been having severe pain in her knee. Denies injuring her head or any other injuries from the fall. Denies dizziness, chest pain, shortness of breath, cough, nausea, vomiting, abdominal pain, or diarrhea.  In the ED, her blood pressure was elevated and borderline tachycardic. Remainder of vital signs stable. X-ray of the left knee showing acute depressed fracture of the lateral tibial plateau with lipohemarthrosis. CT of the left knee showing lateral tibial plateau fracture with lipohemarthrosis. Knee immobilizer placed. ED provider consulted Dr. Marcelino Scot from orthopedics who recommended nonsurgical treatment.   Hospital Course:  Left lateral tibial plateau fracture secondary to a mechanical fall:  Patient presenting as a transfer from urgent  care following mechanical fall. CT of the left knee showing lateral tibial plateau fracture with lipohemarthrosis. Orthopedics consulted, Dr. Marcelino Scot; recommending nonoperative management this time.   --Continue knee immobilizer/hinged brace per orthopedics --Weightbearing left lower extremity x6 weeks --Orthopedics recommends DVT prophylaxis with Lovenox 50 mg [place] daily x21 days given her comorbidities and lower extremity fracture --PT/OT evaluation: Pending --Change Norco-->Percocet PO q4h prn, for breakthrough add ibuprofen And morphine 2mg  IV q4h prn severe breakthrough pain --Outpatient follow-up with Dr. Marcelino Scot 2 weeks  Elevated blood pressure readings: No documented history of hypertension. Pain likely contributing. Blood pressure significantly improved after pain management.  --BP 121/64 this morning --Continue to monitor blood pressure as OP Hypothyroidism: Continue home Synthroid 75 mcg p.o. daily  Obesity, severe Body mass index is 42.91 kg/m.  Needs aggressive weight loss measures/lifestyle changes as this complicates all facets of care.   Discharge Exam: Vitals:   03/14/20 0654 03/14/20 1409  BP: (!) 159/90 125/67  Pulse: 75 65  Resp: 16 16  Temp: 98.2 F (36.8 C) 97.8 F (36.6 C)  SpO2: 96% 96%     awake coherent no distress EOMI NCAT in no focal deficit pupils equally reactive to light Chest Clear  Left knee in knee immobilizer She has been somewhat ambulatory Limited by pain  Neurologically intact except left leg which is in immobilizer sensory intact   Discharge Instructions   Discharge Instructions    Diet - low sodium heart healthy   Complete by: As directed    Discharge instructions   Complete by: As directed    We will make sure that you have all of your equipment delivered-continue rehab and we have ordered your equipment  Please make sure you get labs in about 1 week at  your physician office and to call them on leaving the hospital to ensure  that you have all of your meds correct and look at your meds carefully on discharge to see changes if there are any You will get a 5-day supply of Percocet we do not typically prescribe more than that for post fracture pain and your primary care physician is to want to talk to you about refills on this Good luck and have a good summer   Increase activity slowly   Complete by: As directed      Allergies as of 03/14/2020      Reactions   Contrast Media [iodinated Diagnostic Agents] Hives      Medication List    TAKE these medications   ELDERBERRY PO Take 5 mLs by mouth at bedtime.   enoxaparin 60 MG/0.6ML injection Commonly known as: LOVENOX Inject 0.5 mLs (50 mg total) into the skin daily for 21 days.   Glucosamine-Chondroitin 1500-1200 MG/30ML Liqd Take 30 mLs by mouth at bedtime.   ibuprofen 400 MG tablet Commonly known as: ADVIL Take 1 tablet (400 mg total) by mouth 4 (four) times daily.   multivitamin with minerals Tabs tablet Take 1 tablet by mouth at bedtime.   omega-3 acid ethyl esters 1 g capsule Commonly known as: LOVAZA Take 1 g by mouth at bedtime.   oxyCODONE-acetaminophen 5-325 MG tablet Commonly known as: PERCOCET/ROXICET Take 1 tablet by mouth every 4 (four) hours as needed for moderate pain.   Synthroid 75 MCG tablet Generic drug: levothyroxine TAKE 1 TABLET (75 MCG TOTAL) BY MOUTH DAILY BEFORE BREAKFAST.   TURMERIC PO Take 1 tablet by mouth at bedtime.   UNABLE TO FIND Take 1 tablet by mouth at bedtime. Med Name: Collagen-Biotin 2000mg    VITAMIN A PO Take 1 tablet by mouth at bedtime.   Vitamin B-12 5000 MCG Tbdp Take 5,000 mcg by mouth at bedtime.   vitamin C 500 MG tablet Commonly known as: ASCORBIC ACID Take 500 mg by mouth at bedtime.   Vitamin D3 50 MCG (2000 UT) Tabs Take 2,000 Units by mouth at bedtime.   zinc gluconate 50 MG tablet Take 50 mg by mouth at bedtime.            Durable Medical Equipment  (From admission,  onward)         Start     Ordered   03/14/20 1233  For home use only DME Other see comment  Once    Comments: Ramp  Question:  Length of Need  Answer:  6 Months   03/14/20 1232   03/14/20 1113  For home use only DME Hospital bed  Once    Question Answer Comment  Length of Need 6 Months   Patient has (list medical condition): Left lateral tibial plateau fracture secondary to a mechanical fall   The above medical condition requires: Patient requires the ability to reposition frequently   Head must be elevated greater than: 45 degrees   Bed type Semi-electric   Support Surface: Gel Overlay      03/14/20 1112   03/13/20 1507  For home use only DME 4 wheeled rolling walker with seat  Once    Question:  Patient needs a walker to treat with the following condition  Answer:  Debility   03/13/20 1507   03/12/20 1528  For home use only DME 3 n 1  Once     03/12/20 1527   03/12/20 1527  For home use only  DME Tub bench  Once    Comments: Tub transfer bench   03/12/20 1527   03/12/20 1526  For home use only DME Walker rolling  Once    Question Answer Comment  Walker: With 5 Inch Wheels   Patient needs a walker to treat with the following condition Closed fracture of lateral portion of left tibial plateau      03/12/20 1527   03/12/20 1524  For home use only DME lightweight manual wheelchair with seat cushion  Once    Comments: Patient suffers from Left lateral tibial plateau fracture secondary to a mechanical fall which impairs their ability to perform daily activities like ambulating  in the home.  A cane  will not resolve  issue with performing activities of daily living. A wheelchair will allow patient to safely perform daily activities. Patient is not able to propel themselves in the home using a standard weight wheelchair due to Left lateral tibial plateau fracture secondary to a mechanical fall. Patient can self propel in the lightweight wheelchair. Length of need 6 weeks . Accessories:  elevating leg rests (ELRs), wheel locks, extensions and anti-tippers.  Back and seat cushion   Elevating leg rests   03/12/20 1527         Allergies  Allergen Reactions  . Contrast Media [Iodinated Diagnostic Agents] Hives   Follow-up Information    Altamese [place], MD. Schedule an appointment as soon as possible for a visit in 2 week(s).   Specialty: Orthopedic Surgery Contact information: [place] [address] [place], [facility] Follow up.   Specialty: Home Health Services Contact information: [place] [address] [phone]            The results of significant diagnostics from this hospitalization (including imaging, microbiology, ancillary and laboratory) are listed below for reference.    Significant Diagnostic Studies: CT Knee Left Wo Contrast  Result Date: 03/12/2020 CLINICAL DATA:  Tibial plateau fracture EXAM: CT OF THE LEFT KNEE WITHOUT CONTRAST TECHNIQUE: Multidetector CT imaging of the left knee was performed according to the standard protocol. Multiplanar CT image reconstructions were also generated. COMPARISON:  Plain films today FINDINGS: There is a lateral tibial plateau fracture. Minimal depression and displacement. Associated joint effusion with lipohemarthrosis. No subluxation or dislocation. No additional acute bony abnormality. IMPRESSION: Lateral tibial plateau fracture with lipohemarthrosis. Electronically Signed   By: Rolm Baptise M.D.   On: 03/12/2020 00:05   DG Knee AP/LAT W/[place] Left  Result Date: 03/11/2020 CLINICAL DATA:  History of osteoarthritis. Fell earlier today with anterior knee pain. EXAM: LEFT KNEE 3 VIEWS COMPARISON:  None. FINDINGS: Acute fracture of the lateral tibial plateau, depressed 2-3 mm. Lipohemarthrosis. No other fracture identified. Ordinary osteoarthritis of the weight-bearing compartments and patellofemoral joint. IMPRESSION: Acute depressed  fracture of the lateral tibial plateau with lipohemarthrosis. Electronically Signed   By: Nelson Chimes M.D.   On: 03/11/2020 20:16    Microbiology: Recent Results (from the past 240 hour(s))  SARS CORONAVIRUS 2 (TAT 6-24 HRS) Nasopharyngeal Nasopharyngeal Swab     Status: None   Collection Time: 03/12/20 12:02 AM   Specimen: Nasopharyngeal Swab  Result Value Ref Range Status   SARS Coronavirus 2 NEGATIVE NEGATIVE Final    Comment: (NOTE) SARS-CoV-2 target nucleic acids are NOT DETECTED. The SARS-CoV-2 RNA is generally detectable in upper and lower respiratory specimens during the acute phase of infection. Negative results do  not preclude SARS-CoV-2 infection, do not rule out co-infections with other pathogens, and should not be used as the sole basis for treatment or other patient management decisions. Negative results must be combined with clinical observations, patient history, and epidemiological information. The expected result is Negative. Fact Sheet for Patients: SugarRoll.be Fact Sheet for Healthcare Providers: https://www.woods-mathews.com/ This test is not yet approved or cleared by the [place] FDA and  has been authorized for detection and/or diagnosis of SARS-CoV-2 by FDA under an Emergency Use Authorization (EUA). This EUA will remain  in effect (meaning this test can be used) for the duration of the COVID-19 declaration under Section 56 4(b)(1) of the Act, 21 U.S.C. section 360bbb-3(b)(1), unless the authorization is terminated or revoked sooner. Performed at [facility], [place] [address] [place],  [address]      Labs: Basic Metabolic Panel: Recent Labs  Lab 03/12/20 0010 03/14/20 0451  NA 138 139  K 4.3 4.0  CL 105 104  CO2 21* 28  GLUCOSE 99 81  BUN 11 12  CREATININE 0.71 0.88  CALCIUM 9.2 8.8*   Liver Function Tests: Recent Labs  Lab 03/12/20 0010  AST 50*  ALT 29  ALKPHOS 110  BILITOT  1.4*  PROT 7.1  ALBUMIN 3.6   No results for input(s): LIPASE, AMYLASE in the last 168 hours. No results for input(s): AMMONIA in the last 168 hours. CBC: Recent Labs  Lab 03/12/20 0010 03/14/20 0451  WBC 8.2 3.6*  NEUTROABS 6.5 1.8  HGB 13.1 12.1  HCT 39.9 37.8  MCV 96.8 99.5  PLT 211 173   Cardiac Enzymes: No results for input(s): CKTOTAL, CKMB, CKMBINDEX, TROPONINI in the last 168 hours. BNP: BNP (last 3 results) No results for input(s): BNP in the last 8760 hours.  ProBNP (last 3 results) No results for input(s): PROBNP in the last 8760 hours.  CBG: No results for input(s): GLUCAP in the last 168 hours.     Signed:  Nita Sells MD   [facility] 03/14/2020, 3:55 PM

## 2020-03-14 NOTE — TOC Progression Note (Addendum)
Transition of Care [facility]) - Progression Note    Patient Details  Name: [name] MRN: [id number] Date of Birth: [birth year]  Transition of Care [facility]) CM/SW Contact  Katherine Lefevre Edson Snowball, RN Phone Number: 03/14/2020, 11:13 AM  Clinical Narrative:     Spoke to patient at bedside.   Ordered walker, hospital bed, wheelchair and 3 in 1. Called Melissa with [facility]. Melissa aware patient discharge today by PTAR.   Melissa with [facility] has a 4 foot ramp in stock. Per [employer] does not cover ramps.  [facility] with [employer], she does not have any ramps in stock. Estelita aslo confirmed ramps are private pay.   Called Jermain with [employer] , they do not have ramps.  Patient has Medicare.gov list for home health agencies at bedside. Patient has not decide yet on agency. Her husband and PT are meeting at 75 am today to discuss needs.  Patient asking how much transport home by [facility] cost. NCM explained unable to get pre authorization. PTAR will bill her insurance. Patient aware of all of above.   [facility] medical , they carry ramps which cost about $100 a foot. For 8 steps [facility] with [place] , recommends a 16 foot ramp which would be special order.   Husband now at bedside discussed all of above, both voiced understanding to all.   For home health they have chosen 1,[facility], [facility] , Radene Knee has accepted referral for HHPT.OT  Melissa with [facility] will call patient's husband Katherine "Ozzie Hoyle" Case [phone] to arrange delivery today. Confirmed phone number and address with Patient and husband.   Once DME is delivered [facility] will call Patient's nurse. NCM will leave PTAR paperwork in patient's chart for nurse to call once DME delivered.  Expected Discharge Plan: [place] Barriers to Discharge: Continued Medical Work up  Expected Discharge Plan and Services Expected Discharge Plan: [place]   Discharge  Planning Services: CM Consult Post Acute Care Choice: Durable Medical Equipment, Home Health Living arrangements for the past 2 months: Single Family Home Expected Discharge Date: 03/14/20                         HH Arranged: OT, PT           Social Determinants of Health (SDOH) Interventions    Readmission Risk Interventions No flowsheet data found.

## 2020-03-14 NOTE — Progress Notes (Signed)
Reviewed lovenox injection teaching. Patient's husband, Ozzie Hoyle, present for teaching as well. Questions and concerns addressed.

## 2020-03-14 NOTE — Progress Notes (Signed)
Nsg Discharge Note  Patient to be discharged home with Home Health. Husband Ozzie Hoyle present for discharge instructions. Patient to be transported by [facility] around 1730/1800. Husband called to confirm equipment will be dropped off at home around 1700.  Admit Date:  03/11/2020 Discharge date: 03/14/2020   Amany Sivilay to be D/C'd Home per MD order.  AVS completed.  Copy for chart, and copy for patient signed, and dated. Patient/caregiver able to verbalize understanding.  Discharge Medication: Allergies as of 03/14/2020      Reactions   Contrast Media [iodinated Diagnostic Agents] Hives      Medication List    TAKE these medications   ELDERBERRY PO Take 5 mLs by mouth at bedtime.   enoxaparin 60 MG/0.6ML injection Commonly known as: LOVENOX Inject 0.5 mLs (50 mg total) into the skin daily for 21 days.   Glucosamine-Chondroitin 1500-1200 MG/30ML Liqd Take 30 mLs by mouth at bedtime.   ibuprofen 400 MG tablet Commonly known as: ADVIL Take 1 tablet (400 mg total) by mouth 4 (four) times daily.   multivitamin with minerals Tabs tablet Take 1 tablet by mouth at bedtime.   omega-3 acid ethyl esters 1 g capsule Commonly known as: LOVAZA Take 1 g by mouth at bedtime.   oxyCODONE-acetaminophen 5-325 MG tablet Commonly known as: PERCOCET/ROXICET Take 1 tablet by mouth every 4 (four) hours as needed for moderate pain.   Synthroid 75 MCG tablet Generic drug: levothyroxine TAKE 1 TABLET (75 MCG TOTAL) BY MOUTH DAILY BEFORE BREAKFAST.   TURMERIC PO Take 1 tablet by mouth at bedtime.   UNABLE TO FIND Take 1 tablet by mouth at bedtime. Med Name: Collagen-Biotin 2000mg    VITAMIN A PO Take 1 tablet by mouth at bedtime.   Vitamin B-12 5000 MCG Tbdp Take 5,000 mcg by mouth at bedtime.   vitamin C 500 MG tablet Commonly known as: ASCORBIC ACID Take 500 mg by mouth at bedtime.   Vitamin D3 50 MCG (2000 UT) Tabs Take 2,000 Units by mouth at bedtime.   zinc gluconate 50 MG  tablet Take 50 mg by mouth at bedtime.            Durable Medical Equipment  (From admission, onward)         Start     Ordered   03/14/20 1233  For home use only DME Other see comment  Once    Comments: Ramp  Question:  Length of Need  Answer:  6 Months   03/14/20 1232   03/14/20 1113  For home use only DME Hospital bed  Once    Question Answer Comment  Length of Need 6 Months   Patient has (list medical condition): Left lateral tibial plateau fracture secondary to a mechanical fall   The above medical condition requires: Patient requires the ability to reposition frequently   Head must be elevated greater than: 45 degrees   Bed type Semi-electric   Support Surface: Gel Overlay      03/14/20 1112   03/13/20 1507  For home use only DME 4 wheeled rolling walker with seat  Once    Question:  Patient needs a walker to treat with the following condition  Answer:  Debility   03/13/20 1507   03/12/20 1528  For home use only DME 3 n 1  Once     03/12/20 1527   03/12/20 1527  For home use only DME Tub bench  Once    Comments: Tub transfer bench   03/12/20  1527   03/12/20 1526  For home use only DME Walker rolling  Once    Question Answer Comment  Walker: With 5 Inch Wheels   Patient needs a walker to treat with the following condition Closed fracture of lateral portion of left tibial plateau      03/12/20 1527   03/12/20 1524  For home use only DME lightweight manual wheelchair with seat cushion  Once    Comments: Patient suffers from Left lateral tibial plateau fracture secondary to a mechanical fall which impairs their ability to perform daily activities like ambulating  in the home.  A cane  will not resolve  issue with performing activities of daily living. A wheelchair will allow patient to safely perform daily activities. Patient is not able to propel themselves in the home using a standard weight wheelchair due to Left lateral tibial plateau fracture secondary to a  mechanical fall. Patient can self propel in the lightweight wheelchair. Length of need 6 weeks . Accessories: elevating leg rests (ELRs), wheel locks, extensions and anti-tippers.  Back and seat cushion   Elevating leg rests   03/12/20 1527          Discharge Assessment: Vitals:   03/14/20 0654 03/14/20 1409  BP: (!) 159/90 125/67  Pulse: 75 65  Resp: 16 16  Temp: 98.2 F (36.8 C) 97.8 F (36.6 C)  SpO2: 96% 96%   Skin clean, dry and intact without evidence of skin break down, no evidence of skin tears noted. IV catheter discontinued intact. Site without signs and symptoms of complications - no redness or edema noted at insertion site, patient denies c/o pain - only slight tenderness at site.  Dressing with slight pressure applied.  D/c Instructions-Education: Discharge instructions given to patient/family with verbalized understanding. D/c education completed with patient/family including follow up instructions, medication list, d/c activities limitations if indicated, with other d/c instructions as indicated by MD - patient able to verbalize understanding, all questions fully answered. Patient instructed to return to ED, call 911, or call MD for any changes in condition.  Patient escorted via [place], and D/C home via private auto.  Erasmo Leventhal, RN 03/14/2020 3:00 PM

## 2020-03-14 NOTE — Care Management (Signed)
    Durable Medical Equipment  (From admission, onward)         Start     Ordered   03/14/20 1113  For home use only DME Hospital bed  Once    Question Answer Comment  Length of Need 6 Months   Patient has (list medical condition): Left lateral tibial plateau fracture secondary to a mechanical fall   The above medical condition requires: Patient requires the ability to reposition frequently   Head must be elevated greater than: 45 degrees   Bed type Semi-electric   Support Surface: Gel Overlay      03/14/20 1112   03/13/20 1507  For home use only DME 4 wheeled rolling walker with seat  Once    Question:  Patient needs a walker to treat with the following condition  Answer:  Debility   03/13/20 1507   03/12/20 1528  For home use only DME 3 n 1  Once     03/12/20 1527   03/12/20 1527  For home use only DME Tub bench  Once    Comments: Tub transfer bench   03/12/20 1527   03/12/20 1526  For home use only DME Walker rolling  Once    Question Answer Comment  Walker: With 5 Inch Wheels   Patient needs a walker to treat with the following condition Closed fracture of lateral portion of left tibial plateau      03/12/20 1527   03/12/20 1524  For home use only DME lightweight manual wheelchair with seat cushion  Once    Comments: Patient suffers from Left lateral tibial plateau fracture secondary to a mechanical fall which impairs their ability to perform daily activities like ambulating  in the home.  A cane  will not resolve  issue with performing activities of daily living. A wheelchair will allow patient to safely perform daily activities. Patient is not able to propel themselves in the home using a standard weight wheelchair due to Left lateral tibial plateau fracture secondary to a mechanical fall. Patient can self propel in the lightweight wheelchair. Length of need 6 weeks . Accessories: elevating leg rests (ELRs), wheel locks, extensions and anti-tippers.  Back and seat cushion    Elevating leg rests   03/12/20 1527

## 2020-03-14 NOTE — Progress Notes (Signed)
Physical Therapy Treatment Patient Details Name: [name] MRN: [id number] DOB: 1951/04/01 Today's Date: 03/14/2020    History of Present Illness This 69 y.o. female admitted after tripping on carpet and sustaining a fall.  She was found to have tibia plateau fx Lt LE (non operative).  PMH includes: CA of cecum, esophageal varices, portal HTN     PT Comments    Pt making excellent progress.  She was able to transfer with min guard to min A with spouse educated on transfers and able to assist.  Pt does well with NWB for transfers and short distance ambulation.  Pt with supportive and able-bodied spouse who can assist as needed.  Discussed multiple transfer scenarios at home and educated on mobility.  The main issue are the stairs to enter the home - will need ambulance transfer home, but family does have plan for emergency exits.  Pt demonstrating mobility necessary to return home with 24 hr support from PT perspective via ambulance transfer and below equipment.    Follow Up Recommendations  Home health PT;Supervision/Assistance - 24 hour     Equipment Recommendations  Rolling walker with 5" wheels;Wheelchair cushion (measurements PT);Wheelchair (measurements PT);3in1 (PT);Hospital bed;Other (comment)(w/c with elevating leg rest; hospital bed due to water bed at home)    Recommendations for Other Services       Precautions / Restrictions Precautions Precautions: Fall;Other (comment) Precaution Comments: NWB LLE Required Braces or Orthoses: Other Brace Other Brace: bledsoe brace - unlocked Restrictions Weight Bearing Restrictions: Yes LLE Weight Bearing: Non weight bearing    Mobility  Bed Mobility Overal bed mobility: Needs Assistance Bed Mobility: Supine to Sit       Sit to supine: Min guard;HOB elevated   General bed mobility comments: Pt educated on use of gait belt and was able to move to EOB with increased time and min cues  Transfers Overall transfer level: Needs  assistance Equipment used: Rolling walker (2 wheeled) Transfers: Sit to/from Stand Sit to Stand: Min assist;From elevated surface         General transfer comment: Educated pts spouse on how to assist and he was able to provide min A for sit to stand;  Discussed safe hand placment, elevated surfaces if possible (placing pillows in recliner), and kicking L leg out to return to sit to prevent weight on leg  Ambulation/Gait Ambulation/Gait assistance: Min assist;+2 safety/equipment Gait Distance (Feet): 6 Feet Assistive device: Rolling walker (2 wheeled) Gait Pattern/deviations: Decreased stride length Gait velocity: decreased   General Gait Details: Pt able to take small hops forward on R LE while maintaing L LE NWB.  Cued for posture.  Spouse provided min A and PT followed with chair.  Discussed could hop or pivot on R LE depending on strength and pain control   Stairs    Recommended ambulance transfer for home.  Did discuss safe options to exit house in an emergency.  Discussed could sit and scoot, or spouse could use w/c and boost down.  Spouse reports familiar with using a hand trunk and moving a fridge up and down steps and knows to go down forward and up backward in w/c.           Wheelchair Mobility    Modified Rankin (Stroke Patients Only)       Balance Overall balance assessment: Needs assistance Sitting-balance support: No upper extremity supported;Feet supported Sitting balance-Leahy Scale: Normal     Standing balance support: Bilateral upper extremity supported;During functional activity Standing balance-Leahy  Scale: Fair                              Cognition   Behavior During Therapy: WFL for tasks assessed/performed Overall Cognitive Status: Within Functional Limits for tasks assessed                                 General Comments: Pt much less anxious today; Spouse present      Exercises General Exercises - Lower  Extremity Ankle Circles/Pumps: AROM;Both;10 reps Quad Sets: AROM;Both;5 reps(gentle contraction on L)    General Comments  Pt and spouse educated on correct placement of bledsoe brace that had slid down.  Educated on how to get in correct place and adjust velcro straps to accomodate for swelling.  Also, educated to monitor skin integrity.  Pt and spouse educated on how to handle toilet or BSC transfers at home.  Recommended sponge baths at this time but if pt wanted to shower would need tub bench.   Educated on HEP: encouraged frequent ankle pumps, gentle quad sets, and AAROM as pain allows.    Pt and spouse reporting feeling much better about going home and no further questions.       Pertinent Vitals/Pain Pain Assessment: No/denies pain Pain Location: L knee Pain Intervention(s): Premedicated before session    Home Living                      Prior Function            PT Goals (current goals can now be found in the care plan section) Progress towards PT goals: Progressing toward goals    Frequency    Min 5X/week      PT Plan Current plan remains appropriate    Co-evaluation              AM-PAC PT "6 Clicks" Mobility   Outcome Measure  Help needed turning from your back to your side while in a flat bed without using bedrails?: A Little Help needed moving from lying on your back to sitting on the side of a flat bed without using bedrails?: A Little Help needed moving to and from a bed to a chair (including a wheelchair)?: A Little Help needed standing up from a chair using your arms (e.g., wheelchair or bedside chair)?: A Little Help needed to walk in hospital room?: A Little Help needed climbing 3-5 steps with a railing? : Total 6 Click Score: 16    End of Session Equipment Utilized During Treatment: Gait belt;Other (comment)(Bledsoe brace) Activity Tolerance: Patient tolerated treatment well Patient left: with call bell/phone within reach;in  chair;with family/visitor present Nurse Communication: Mobility status PT Visit Diagnosis: Other abnormalities of gait and mobility (R26.89);History of falling (Z91.81);Muscle weakness (generalized) (M62.81);Pain     Time: 1210-1248 PT Time Calculation (min) (ACUTE ONLY): 38 min  Charges:  $Gait Training: 8-22 mins $Therapeutic Activity: 23-37 mins                     Maggie Font, PT Acute Rehab Services Pager [phone] [facility] [phone]    Karlton Lemon 03/14/2020, 2:20 PM

## 2020-03-16 DIAGNOSIS — R000 Tachycardia, unspecified: Secondary | ICD-10-CM | POA: Diagnosis not present

## 2020-03-16 DIAGNOSIS — R030 Elevated blood-pressure reading, without diagnosis of hypertension: Secondary | ICD-10-CM | POA: Diagnosis not present

## 2020-03-16 DIAGNOSIS — I10 Essential (primary) hypertension: Secondary | ICD-10-CM | POA: Diagnosis not present

## 2020-03-16 DIAGNOSIS — S82142D Displaced bicondylar fracture of left tibia, subsequent encounter for closed fracture with routine healing: Secondary | ICD-10-CM | POA: Diagnosis not present

## 2020-03-16 DIAGNOSIS — K5900 Constipation, unspecified: Secondary | ICD-10-CM | POA: Diagnosis not present

## 2020-03-18 DIAGNOSIS — R030 Elevated blood-pressure reading, without diagnosis of hypertension: Secondary | ICD-10-CM | POA: Diagnosis not present

## 2020-03-18 DIAGNOSIS — S82142D Displaced bicondylar fracture of left tibia, subsequent encounter for closed fracture with routine healing: Secondary | ICD-10-CM | POA: Diagnosis not present

## 2020-03-18 DIAGNOSIS — I10 Essential (primary) hypertension: Secondary | ICD-10-CM | POA: Diagnosis not present

## 2020-03-18 DIAGNOSIS — K5900 Constipation, unspecified: Secondary | ICD-10-CM | POA: Diagnosis not present

## 2020-03-18 DIAGNOSIS — R000 Tachycardia, unspecified: Secondary | ICD-10-CM | POA: Diagnosis not present

## 2020-03-19 ENCOUNTER — Encounter: Payer: Self-pay | Admitting: Family Medicine

## 2020-03-19 ENCOUNTER — Other Ambulatory Visit: Payer: Self-pay

## 2020-03-19 ENCOUNTER — Telehealth: Payer: Self-pay | Admitting: *Deleted

## 2020-03-19 ENCOUNTER — Ambulatory Visit (INDEPENDENT_AMBULATORY_CARE_PROVIDER_SITE_OTHER): Payer: Medicare Other | Admitting: Family Medicine

## 2020-03-19 VITALS — BP 147/70 | HR 71 | Temp 97.7°F | Ht 64.0 in

## 2020-03-19 DIAGNOSIS — R000 Tachycardia, unspecified: Secondary | ICD-10-CM | POA: Diagnosis not present

## 2020-03-19 DIAGNOSIS — K5900 Constipation, unspecified: Secondary | ICD-10-CM | POA: Diagnosis not present

## 2020-03-19 DIAGNOSIS — R011 Cardiac murmur, unspecified: Secondary | ICD-10-CM | POA: Diagnosis not present

## 2020-03-19 DIAGNOSIS — R030 Elevated blood-pressure reading, without diagnosis of hypertension: Secondary | ICD-10-CM

## 2020-03-19 DIAGNOSIS — S82142D Displaced bicondylar fracture of left tibia, subsequent encounter for closed fracture with routine healing: Secondary | ICD-10-CM | POA: Diagnosis not present

## 2020-03-19 DIAGNOSIS — I10 Essential (primary) hypertension: Secondary | ICD-10-CM | POA: Diagnosis not present

## 2020-03-19 NOTE — Telephone Encounter (Signed)
Noted  

## 2020-03-19 NOTE — Patient Instructions (Signed)
Get blood pressure cuff with large cuff.. check BP  At home goal < 140/90. Call with measurements in 1 week.  Please stop at the lab to have labs drawn.  We will call you to set up ECHO.

## 2020-03-19 NOTE — Progress Notes (Signed)
Chief Complaint  Patient presents with  . Hypertension    History of Present Illness: HPI   69 year old female patient of [name] presents with  Recent BP elevations noted at PT.. 200-190/100. She has had recent tibial plateau fracture  (4/19) and in on nonweight bearing status.  Was admitted 3 days... Bps were high normal.  BMET was clear. She does not check BP lately. BP has not been high previously.. was high after fall with pain.  She reports pain is well controlled with ibuprofen daily. Occ using oxycodone. Pain 5/10 on pain scale.  no CP, no SOB. She is on lovenox for clot prevention.   No new swelling in legs.  BP Readings from Last 3 Encounters:  03/19/20 (!) 147/70  03/14/20 125/67  03/11/20 (!) 153/76   Nml TSH  01/2020   This visit occurred during the SARS-CoV-2 public health emergency.  Safety protocols were in place, including screening questions prior to the visit, additional usage of staff PPE, and extensive cleaning of exam room while observing appropriate contact time as indicated for disinfecting solutions.   COVID 19 screen:  No recent travel or known exposure to COVID19 The patient denies respiratory symptoms of COVID 19 at this time. The importance of social distancing was discussed today.     Review of Systems  Constitutional: Negative for chills and fever.  HENT: Negative for congestion and ear pain.   Eyes: Negative for pain and redness.  Respiratory: Negative for cough and shortness of breath.   Cardiovascular: Negative for chest pain, palpitations and leg swelling.  Gastrointestinal: Negative for abdominal pain, blood in stool, constipation, diarrhea, nausea and vomiting.  Genitourinary: Negative for dysuria.  Musculoskeletal: Negative for falls and myalgias.  Skin: Negative for rash.  Neurological: Negative for dizziness.  Psychiatric/Behavioral: Negative for depression. The patient is not nervous/anxious.       Past Medical  History:  Diagnosis Date  . Cancer ([place])    cecum  . Elevated liver function tests   . Esophageal varices ([place])   . Hypothyroidism   . Iron deficiency anemia   . Liver disease    chemotherapy complication, per pt, shunts placed to bypass liver  . Malignant neoplasm of cecum ([place])   . Portal hypertension ([place])   . Splenomegaly     reports that she has never smoked. She has never used smokeless tobacco. She reports that she does not drink alcohol or use drugs.   Current Outpatient Medications:  .  Cholecalciferol (VITAMIN D3) 50 MCG (2000 UT) TABS, Take 2,000 Units by mouth at bedtime. , Disp: , Rfl:  .  Cyanocobalamin (VITAMIN B-12) 5000 MCG TBDP, Take 5,000 mcg by mouth at bedtime. , Disp: , Rfl:  .  ELDERBERRY PO, Take 5 mLs by mouth at bedtime. , Disp: , Rfl:  .  enoxaparin (LOVENOX) 60 MG/0.6ML injection, Inject 0.5 mLs (50 mg total) into the skin daily for 21 days., Disp: 10.5 mL, Rfl: 0 .  Glucosamine-Chondroitin 1500-1200 MG/30ML LIQD, Take 30 mLs by mouth at bedtime. , Disp: , Rfl:  .  ibuprofen (ADVIL) 400 MG tablet, Take 1 tablet (400 mg total) by mouth 4 (four) times daily., Disp: 30 tablet, Rfl: 0 .  Multiple Vitamin (MULTIVITAMIN WITH MINERALS) TABS tablet, Take 1 tablet by mouth at bedtime., Disp: , Rfl:  .  omega-3 acid ethyl esters (LOVAZA) 1 g capsule, Take 1 g by mouth at bedtime. , Disp: , Rfl:  .  oxyCODONE-acetaminophen (PERCOCET/ROXICET) 5-325  MG tablet, Take 1 tablet by mouth every 4 (four) hours as needed for moderate pain., Disp: 30 tablet, Rfl: 0 .  SYNTHROID 75 MCG tablet, TAKE 1 TABLET (75 MCG TOTAL) BY MOUTH DAILY BEFORE BREAKFAST., Disp: 30 tablet, Rfl: 11 .  TURMERIC PO, Take 1 tablet by mouth at bedtime. , Disp: , Rfl:  .  UNABLE TO FIND, Take 1 tablet by mouth at bedtime. Med Name: Collagen-Biotin 2000mg  , Disp: , Rfl:  .  VITAMIN A PO, Take 1 tablet by mouth at bedtime. , Disp: , Rfl:  .  vitamin C (ASCORBIC ACID) 500 MG tablet, Take 500 mg by mouth at  bedtime. , Disp: , Rfl:  .  zinc gluconate 50 MG tablet, Take 50 mg by mouth at bedtime., Disp: , Rfl:    Observations/Objective: Blood pressure (!) 147/70, pulse 71, temperature 97.7 F (36.5 C), temperature source Oral, height 5\' 4"  (1.626 m), SpO2 95 %.  Physical Exam Constitutional:      General: She is not in acute distress.    Appearance: Normal appearance. She is well-developed. She is obese. She is not ill-appearing or toxic-appearing.  HENT:     Head: Normocephalic.     Right Ear: Hearing, tympanic membrane, ear canal and external ear normal.     Left Ear: Hearing, tympanic membrane, ear canal and external ear normal.     Nose: Nose normal.  Eyes:     General: Lids are normal. Lids are everted, no foreign bodies appreciated.     Conjunctiva/sclera: Conjunctivae normal.     Pupils: Pupils are equal, round, and reactive to light.  Neck:     Thyroid: No thyroid mass or thyromegaly.     Vascular: No carotid bruit.     Trachea: Trachea normal.  Cardiovascular:     Rate and Rhythm: Normal rate and regular rhythm.     Heart sounds: S1 normal and S2 normal. Murmur present. Systolic murmur present with a grade of 2/6. No gallop.   Pulmonary:     Effort: Pulmonary effort is normal. No respiratory distress.     Breath sounds: Normal breath sounds. No wheezing, rhonchi or rales.  Abdominal:     General: Bowel sounds are normal. There is no distension or abdominal bruit.     Palpations: Abdomen is soft. There is no fluid wave or mass.     Tenderness: There is no abdominal tenderness. There is no guarding or rebound.     Hernia: No hernia is present.  Musculoskeletal:     Cervical back: Normal range of motion and neck supple.  Lymphadenopathy:     Cervical: No cervical adenopathy.  Skin:    General: Skin is warm and dry.     Findings: No rash.  Neurological:     Mental Status: She is alert.     Cranial Nerves: No cranial nerve deficit.     Sensory: No sensory deficit.   Psychiatric:        Mood and Affect: Mood is not anxious or depressed.        Speech: Speech normal.        Behavior: Behavior normal. Behavior is cooperative.        Judgment: Judgment normal.      Assessment and Plan    Elevated blood pressure reading in office without diagnosis of hypertension Likely multifactorial due to pain level, NSAIDs, inactivity, obesity etc. Eval with labs .  recent TSH normal.   No clear new symptoms suggesting DVT,  PE, CAD.    When able heart healthy low salt diet, increase exercise and weight loss.  Follow at home.. but use a large cuff. Call in 1 week with measurements.  May want to start a med to treat if elevated at home.  Murmur, cardiac New murmur... eval with ECHO.  pt asymptomatic.    Eliezer Lofts, MD

## 2020-03-19 NOTE — Telephone Encounter (Signed)
Clair Gulling PTA with [facility] called stating that he is with patient for physical therapy today. Clair Gulling stated that patient's blood pressure is running 200-190/100, heart rate 71. Clair Gulling stated that patient has had a fracture and is on non-bearing weight restrictions on her left foot. Clair Gulling stated that patient does not have any GI, SOB, back, chest or arm pain. Clair Gulling sated that patient stated that her blood pressure has not been this high before. Clair Gulling stated that he has checked her blood pressure twice 15 minutes apart.  Appointment scheduled with Dr. Diona Browner today at 2:40 pm. ER and 911 precautions given to patient and she verbalized understanding.

## 2020-03-19 NOTE — Assessment & Plan Note (Signed)
New murmur... eval with ECHO.  pt asymptomatic.

## 2020-03-19 NOTE — Assessment & Plan Note (Addendum)
Likely multifactorial due to pain level, NSAIDs, inactivity, obesity etc. Eval with labs .  recent TSH normal.   No clear new symptoms suggesting DVT, PE, CAD.    When able heart healthy low salt diet, increase exercise and weight loss.  Follow at home.. but use a large cuff. Call in 1 week with measurements.  May want to start a med to treat if elevated at home.

## 2020-03-20 LAB — COMPREHENSIVE METABOLIC PANEL
ALT: 26 U/L (ref 0–35)
AST: 44 U/L — ABNORMAL HIGH (ref 0–37)
Albumin: 3.5 g/dL (ref 3.5–5.2)
Alkaline Phosphatase: 110 U/L (ref 39–117)
BUN: 14 mg/dL (ref 6–23)
CO2: 28 mEq/L (ref 19–32)
Calcium: 9.5 mg/dL (ref 8.4–10.5)
Chloride: 106 mEq/L (ref 96–112)
Creatinine, Ser: 0.72 mg/dL (ref 0.40–1.20)
GFR: 80.36 mL/min (ref >60.00)
Glucose, Bld: 111 mg/dL — ABNORMAL HIGH (ref 70–99)
Potassium: 4 mEq/L (ref 3.5–5.1)
Sodium: 141 mEq/L (ref 135–145)
Total Bilirubin: 0.9 mg/dL (ref 0.2–1.2)
Total Protein: 6.8 g/dL (ref 6.0–8.3)

## 2020-03-20 LAB — CBC WITH DIFFERENTIAL/PLATELET
Basophils Absolute: 0 10*3/uL (ref 0.0–0.1)
Basophils Relative: 0.7 % (ref 0.0–3.0)
Eosinophils Absolute: 0.3 10*3/uL (ref 0.0–0.7)
Eosinophils Relative: 7 % — ABNORMAL HIGH (ref 0.0–5.0)
HCT: 38 % (ref 36.0–46.0)
Hemoglobin: 12.8 g/dL (ref 12.0–15.0)
Lymphocytes Relative: 16.9 % (ref 12.0–46.0)
Lymphs Abs: 0.8 10*3/uL (ref 0.7–4.0)
MCHC: 33.8 g/dL (ref 30.0–36.0)
MCV: 97.2 fl (ref 78.0–100.0)
Monocytes Absolute: 0.4 10*3/uL (ref 0.1–1.0)
Monocytes Relative: 8.2 % (ref 3.0–12.0)
Neutro Abs: 3.2 10*3/uL (ref 1.4–7.7)
Neutrophils Relative %: 67.2 % (ref 43.0–77.0)
Platelets: 208 10*3/uL (ref 150.0–400.0)
RBC: 3.91 Mil/uL (ref 3.87–5.11)
RDW: 14.7 % (ref 11.5–15.5)
WBC: 4.7 10*3/uL (ref 4.0–10.5)

## 2020-03-21 DIAGNOSIS — K5900 Constipation, unspecified: Secondary | ICD-10-CM | POA: Diagnosis not present

## 2020-03-21 DIAGNOSIS — R000 Tachycardia, unspecified: Secondary | ICD-10-CM | POA: Diagnosis not present

## 2020-03-21 DIAGNOSIS — S82142D Displaced bicondylar fracture of left tibia, subsequent encounter for closed fracture with routine healing: Secondary | ICD-10-CM | POA: Diagnosis not present

## 2020-03-21 DIAGNOSIS — I10 Essential (primary) hypertension: Secondary | ICD-10-CM | POA: Diagnosis not present

## 2020-03-21 DIAGNOSIS — R030 Elevated blood-pressure reading, without diagnosis of hypertension: Secondary | ICD-10-CM | POA: Diagnosis not present

## 2020-03-26 DIAGNOSIS — I10 Essential (primary) hypertension: Secondary | ICD-10-CM | POA: Diagnosis not present

## 2020-03-26 DIAGNOSIS — R030 Elevated blood-pressure reading, without diagnosis of hypertension: Secondary | ICD-10-CM | POA: Diagnosis not present

## 2020-03-26 DIAGNOSIS — S82142D Displaced bicondylar fracture of left tibia, subsequent encounter for closed fracture with routine healing: Secondary | ICD-10-CM | POA: Diagnosis not present

## 2020-03-26 DIAGNOSIS — K5900 Constipation, unspecified: Secondary | ICD-10-CM | POA: Diagnosis not present

## 2020-03-26 DIAGNOSIS — R000 Tachycardia, unspecified: Secondary | ICD-10-CM | POA: Diagnosis not present

## 2020-03-27 ENCOUNTER — Encounter: Payer: Self-pay | Admitting: Family Medicine

## 2020-03-27 DIAGNOSIS — R000 Tachycardia, unspecified: Secondary | ICD-10-CM | POA: Diagnosis not present

## 2020-03-27 DIAGNOSIS — R030 Elevated blood-pressure reading, without diagnosis of hypertension: Secondary | ICD-10-CM | POA: Diagnosis not present

## 2020-03-27 DIAGNOSIS — I10 Essential (primary) hypertension: Secondary | ICD-10-CM | POA: Diagnosis not present

## 2020-03-27 DIAGNOSIS — S82142D Displaced bicondylar fracture of left tibia, subsequent encounter for closed fracture with routine healing: Secondary | ICD-10-CM | POA: Diagnosis not present

## 2020-03-27 DIAGNOSIS — K5900 Constipation, unspecified: Secondary | ICD-10-CM | POA: Diagnosis not present

## 2020-04-03 DIAGNOSIS — S82142D Displaced bicondylar fracture of left tibia, subsequent encounter for closed fracture with routine healing: Secondary | ICD-10-CM | POA: Diagnosis not present

## 2020-04-04 DIAGNOSIS — R030 Elevated blood-pressure reading, without diagnosis of hypertension: Secondary | ICD-10-CM | POA: Diagnosis not present

## 2020-04-04 DIAGNOSIS — I10 Essential (primary) hypertension: Secondary | ICD-10-CM | POA: Diagnosis not present

## 2020-04-04 DIAGNOSIS — K5900 Constipation, unspecified: Secondary | ICD-10-CM | POA: Diagnosis not present

## 2020-04-04 DIAGNOSIS — R000 Tachycardia, unspecified: Secondary | ICD-10-CM | POA: Diagnosis not present

## 2020-04-04 DIAGNOSIS — S82142D Displaced bicondylar fracture of left tibia, subsequent encounter for closed fracture with routine healing: Secondary | ICD-10-CM | POA: Diagnosis not present

## 2020-04-09 ENCOUNTER — Ambulatory Visit (HOSPITAL_COMMUNITY): Payer: Medicare Other | Attending: Cardiovascular Disease

## 2020-04-09 ENCOUNTER — Other Ambulatory Visit: Payer: Self-pay

## 2020-04-09 DIAGNOSIS — R011 Cardiac murmur, unspecified: Secondary | ICD-10-CM | POA: Insufficient documentation

## 2020-04-11 ENCOUNTER — Encounter: Payer: Self-pay | Admitting: Family Medicine

## 2020-04-11 ENCOUNTER — Telehealth: Payer: Self-pay | Admitting: Family Medicine

## 2020-04-11 DIAGNOSIS — R030 Elevated blood-pressure reading, without diagnosis of hypertension: Secondary | ICD-10-CM | POA: Diagnosis not present

## 2020-04-11 DIAGNOSIS — I10 Essential (primary) hypertension: Secondary | ICD-10-CM | POA: Diagnosis not present

## 2020-04-11 DIAGNOSIS — R000 Tachycardia, unspecified: Secondary | ICD-10-CM | POA: Diagnosis not present

## 2020-04-11 DIAGNOSIS — K5900 Constipation, unspecified: Secondary | ICD-10-CM | POA: Diagnosis not present

## 2020-04-11 DIAGNOSIS — S82142D Displaced bicondylar fracture of left tibia, subsequent encounter for closed fracture with routine healing: Secondary | ICD-10-CM | POA: Diagnosis not present

## 2020-04-11 NOTE — Telephone Encounter (Signed)
Received a call from Clair Gulling with [facility], he wanted to give FYI that the patient is being d/c'd from [facility] therapy and she has been referred by Dr Marcelino Scot for OP PT.   Pt is needing a handicap placard  Update on BP readings, patient is concerned. Had Echo done this week and they advised her that nothing needs to be changed until they see PCP for follow up. Pt is still very concerned and wants her elevated BP addressed.   5/20  L 170/90  R 150/84  5/19 L 161/89  R 144/77  5/15 L 150/72  R 134/71  Will send to [place] as FYI of the above, Please advise, thanks.

## 2020-04-12 ENCOUNTER — Other Ambulatory Visit: Payer: Self-pay | Admitting: Family Medicine

## 2020-04-12 ENCOUNTER — Telehealth: Payer: Self-pay | Admitting: *Deleted

## 2020-04-12 DIAGNOSIS — I10 Essential (primary) hypertension: Secondary | ICD-10-CM

## 2020-04-12 MED ORDER — LOSARTAN POTASSIUM 50 MG PO TABS: 50.0000 mg | ORAL_TABLET | Freq: Every day | ORAL | 5 refills | Status: DC

## 2020-04-12 NOTE — Telephone Encounter (Signed)
Called pt--regarding medication been sent Rx Losartan to the pharmacy and  instructions by Neoma Laming. Pt stated not taking for blood thinning and taking tylenol for pain. Disability placard is ready to be pick up front office.

## 2020-04-12 NOTE — Telephone Encounter (Signed)
Notified pt --application for the parking placard is ready to be pick up front office.

## 2020-04-12 NOTE — Telephone Encounter (Signed)
Please call patient and tell the following- I have sent in a blood pressure medication called losartan. I want her to take in the evening/bedtime. Continue to check blood pressure readings 2-3 times per week and send me readings in 2 weeks.  I have filled out disability parking placard and placed on you desk- please ask her if she wants to pick it up or have it mailed? Is she able to come into the office? If so, please follow up in 4 weeks.  Is she still on shots for blood thinning? If so, she should not be taking any aspirin, ibuprofen, naprosyn, etc. Can take her pain medication and acetaminophen (Tylenol).

## 2020-04-13 DIAGNOSIS — S82143A Displaced bicondylar fracture of unspecified tibia, initial encounter for closed fracture: Secondary | ICD-10-CM | POA: Diagnosis not present

## 2020-04-24 DIAGNOSIS — S82142D Displaced bicondylar fracture of left tibia, subsequent encounter for closed fracture with routine healing: Secondary | ICD-10-CM | POA: Diagnosis not present

## 2020-04-26 ENCOUNTER — Encounter: Payer: Self-pay | Admitting: Family Medicine

## 2020-05-06 ENCOUNTER — Other Ambulatory Visit: Payer: Self-pay | Admitting: Family Medicine

## 2020-05-06 DIAGNOSIS — I10 Essential (primary) hypertension: Secondary | ICD-10-CM

## 2020-05-06 MED ORDER — LOSARTAN POTASSIUM 100 MG PO TABS: 50.0000 mg | ORAL_TABLET | Freq: Every day | ORAL | 0 refills | Status: DC

## 2020-05-13 ENCOUNTER — Encounter: Payer: Self-pay | Admitting: Physical Therapy

## 2020-05-13 ENCOUNTER — Other Ambulatory Visit: Payer: Self-pay

## 2020-05-13 ENCOUNTER — Ambulatory Visit: Payer: Medicare Other | Attending: Orthopedic Surgery | Admitting: Physical Therapy

## 2020-05-13 DIAGNOSIS — M6281 Muscle weakness (generalized): Secondary | ICD-10-CM | POA: Diagnosis not present

## 2020-05-13 DIAGNOSIS — R262 Difficulty in walking, not elsewhere classified: Secondary | ICD-10-CM | POA: Diagnosis not present

## 2020-05-13 DIAGNOSIS — M25562 Pain in left knee: Secondary | ICD-10-CM | POA: Diagnosis not present

## 2020-05-13 NOTE — Patient Instructions (Signed)
Access Code: [email or web address] URL: [email or web address] Date: 05/13/2020 Prepared by: Amador Cunas  Exercises Seated Long Arc Quad - 1 x daily - 7 x weekly - 3 sets - 10 reps Seated Knee Flexion Stretch - 1 x daily - 7 x weekly - 3 sets - 2 reps - 20 sec hold Seated Toe Raise - 1 x daily - 7 x weekly - 3 sets - 10 reps Seated Heel Raise - 1 x daily - 7 x weekly - 3 sets - 10 reps Seated March - 1 x daily - 7 x weekly - 3 sets - 10 reps Sit to Stand with Armchair - 1 x daily - 7 x weekly - 3 sets - 5 reps

## 2020-05-13 NOTE — Therapy (Signed)
[place] [address], [place], [address] Phone: [phone]   Fax:  [phone]  Physical Therapy Evaluation  Patient Details  Name: [name] MRN: [id number] Date of Birth: [birth year] Referring Provider (PT): Clarene Reamer   Encounter Date: 05/13/2020   PT End of Session - 05/13/20 1705    Visit Number 1    Date for PT Re-Evaluation 07/13/20    PT Start Time 5035    PT Stop Time 1653    PT Time Calculation (min) 43 min    Activity Tolerance Patient tolerated treatment well    Behavior During Therapy [facility] for tasks assessed/performed           Past Medical History:  Diagnosis Date  . Cancer ([place])    cecum  . Elevated liver function tests   . Esophageal varices ([place])   . Hypothyroidism   . Iron deficiency anemia   . Liver disease    chemotherapy complication, per pt, shunts placed to bypass liver  . Malignant neoplasm of cecum ([place])   . Portal hypertension ([place])   . Splenomegaly     Past Surgical History:  Procedure Laterality Date  . ESOPHAGEAL VARICE LIGATION    . HEMICOLECTOMY  01/08/03  . LIVER SURGERY     shunts placed after chemo complication  . SKIN FULL THICKNESS GRAFT N/A 09/12/2019   Procedure: debridement and FTSG to the nose from left upper arm;  Surgeon: Cindra Presume, MD;  Location: [facility];  Service: Plastics;  Laterality: N/A;  2 hours, please    There were no vitals filed for this visit.    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 Pt presented to ED and xrays shows left lateral tibial plateau fx. Pt reports that she is at 50% PWB status. Pt reports to clinic in W/C and [place] brace; brace is unlocked and reports no ROM restrictions.    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 Yes    Pain Score 2     Pain Location Knee    Pain Orientation Left    Pain Descriptors / Indicators Aching    Pain Type Acute pain    Pain Onset More than a month ago    Pain Frequency Intermittent    Aggravating Factors  standing, walking, too much activity    Pain Relieving Factors rest, repositioning              [facility] PT Assessment - 05/13/20 0001      Assessment   Medical Diagnosis L tibial plateau fx    Referring Provider (PT) Clarene Reamer    Onset Date/Surgical Date 03/11/20    Next MD Visit 05/22/2020    Prior Therapy none      Precautions   Required Braces or Orthoses Knee Immobilizer - Left    Knee Immobilizer - Left On at all times   unlocked     Restrictions   Weight Bearing Restrictions Yes    LLE Weight Bearing Partial weight bearing    LLE Partial Weight Bearing Percentage or Pounds 50      Balance Screen   Has the patient fallen in  the past 6 months Yes    How many times? 1    Has the patient had a decrease in activity level because of a fear of falling?  No    Is the patient reluctant to leave their home because of a fear of falling?  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      Observation/Other Assessments-Edema    Edema Circumferential      Circumferential Edema   Circumferential - Right 50 cm    Circumferential - Left  50 cm      ROM / Strength   AROM / PROM / Strength AROM;PROM      AROM   AROM Assessment Site Knee    Right/Left Knee Left    Left Knee Extension 3    Left Knee Flexion 91      PROM   PROM Assessment Site Knee    Right/Left Knee Left;Right    Right Knee Extension 0    Right Knee Flexion 100    Left Knee  Extension 1    Left Knee Flexion 95      Palpation   Palpation comment ttp joint line      Transfers   Five time sit to stand comments  able to follow WB precautions with STS; difficulty with eccentric control      Ambulation/Gait   Gait Comments Education provided on 50% WB; pt hesitant to put any weight through LLE      Standardized Balance Assessment   Standardized Balance Assessment Timed Up and Go Test      Timed Up and Go Test   Normal TUG (seconds) 30    TUG Comments with RW; difficulty with turning                      Objective measurements completed on examination: See above findings.       [facility] Adult PT Treatment/Exercise - 05/13/20 0001      Exercises   Exercises Knee/Hip      Knee/Hip Exercises: Seated   Long Arc Quad Both;1 set;10 reps    Knee/Hip Flexion knee flexion x5 with stretch    Other Seated Knee/Hip Exercises STS x5 with focus on following WB precautions    Other Seated Knee/Hip Exercises heel raises/toe raises x10    Marching Both;1 set;10 reps                  PT Education - 05/13/20 1704    Education Details Pt educated on POC and HEP    Person(s) Educated Patient    Methods Explanation;Demonstration;Handout    Comprehension Verbalized understanding;Returned demonstration            PT Short Term Goals - 05/13/20 1744      PT SHORT TERM GOAL #1   Title Pt will be independent with HEP    Time 2    Period Weeks    Status New    Target Date 05/27/20      PT SHORT TERM GOAL #2   Title Pt will be compliant with PWB status and able to demonstrate gait with proper observance of WB status    Time 2  Period Weeks    Status New    Target Date 05/27/20             PT Long Term Goals - 05/13/20 1745      PT LONG TERM GOAL #1   Title Pt will demonstrate L knee flexion/extension equivalent to R knee    Time 6    Period Weeks    Status New    Target Date 06/24/20      PT LONG TERM GOAL #2   Title Pt  will demonstrate LLE MMT equivalent to RLE    Time 6    Period Weeks    Target Date 06/24/20      PT LONG TERM GOAL #3   Title Pt will decrease TUG time to <15 sec with LRAD    Time 6    Period Weeks    Status New    Target Date 06/24/20      PT LONG TERM GOAL #4   Title Pt will ambulate over level/unlevel surfaces with no AD or LRAD >500 ft    Time 6    Period Weeks    Status New    Target Date 06/24/20      PT LONG TERM GOAL #5   Title Pt will ascend/descend 28 stairs independently with HR    Time 6    Period Weeks    Status New    Target Date 06/24/20                  Plan - 05/13/20 1706    Clinical Impression Statement Pt presents to clinic s/p L lateral tibial plateau fx on 03/11/20. Pt entered clinic in W/C; is using W/C for most household tasks, only ambulating with RW to bathroom and for transfers. Pt is 50% WB on LLE and required some education on weightbearing status; pt was hesistant to put any weight through LLE; needs reinforcement of WB status in future rx. Pt wearing knee immoblizer brace unlocked at all times per MD with brace unlocked; no ROM restrictions. 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 Pt needs to be able to safely ascend/descend stairs by return of school in August. Pt would benefit from skilled PT to address the above impairments.    Personal Factors and Comorbidities Comorbidity 1    Comorbidities HTN    Examination-Activity Limitations Transfers;Locomotion Level;Squat;Stairs    Examination-Participation Restrictions Community Activity;Interpersonal Relationship    Stability/Clinical Decision Making Evolving/Moderate complexity    Clinical Decision Making Low    Rehab Potential Good    PT Frequency 2x / week    PT Duration 6 weeks    PT Treatment/Interventions ADLs/Self Care Home Management;Cryotherapy;Electrical Stimulation;Gait training;Stair training;Functional mobility  training;Therapeutic activities;Therapeutic exercise;Balance training;Neuromuscular re-education;Manual techniques;Patient/family education;Passive range of motion;Vasopneumatic Device    PT Next Visit Plan reinforce WB precautions, gait training, LE flexibility/strength to tolerance    PT Home Exercise Plan LAQ, seated marches, toe/heel raises, STS    Consulted and Agree with Plan of Care Patient           Patient will benefit from skilled therapeutic intervention in order to improve the following deficits and impairments:  Abnormal gait, Decreased range of motion, Difficulty walking, Decreased endurance, Decreased activity tolerance, Pain, Decreased balance, Hypomobility, Decreased mobility, Decreased strength  Visit Diagnosis: Acute pain of left knee  Muscle weakness (generalized)  Difficulty in walking, not elsewhere classified  Problem List Patient Active Problem List   Diagnosis Date Noted  . Murmur, cardiac 03/19/2020  . Tibial plateau fracture 03/12/2020  . Elevated blood pressure reading in office without diagnosis of hypertension 03/12/2020  . Tibial plateau fracture, left, closed, initial encounter 03/12/2020  . Chronic pain of both knees 01/29/2020  . Morbid obesity ([place]) 01/29/2020  . Hypothyroidism 06/30/2019  . Basal cell carcinoma of nose 06/30/2019  . Colon cancer ([place]) 11/30/2011  . Hemorrhage of gastrointestinal tract 05/04/2011  . Esophageal varices ([place]) 06/12/2010  . Portal hypertension ([place]) 01/23/2008   Amador Cunas, PT, DPT Donald Prose Mariateresa Batra 05/13/2020, 5:50 PM  [place] [address] [place], [place], [address] Phone: [phone]   Fax:  [phone]  Name: Makailey Hodgkin MRN: [id number] Date of Birth: Jun 17, 1951

## 2020-05-14 DIAGNOSIS — S82143A Displaced bicondylar fracture of unspecified tibia, initial encounter for closed fracture: Secondary | ICD-10-CM | POA: Diagnosis not present

## 2020-05-22 DIAGNOSIS — S82142D Displaced bicondylar fracture of left tibia, subsequent encounter for closed fracture with routine healing: Secondary | ICD-10-CM | POA: Diagnosis not present

## 2020-05-23 ENCOUNTER — Encounter: Payer: Self-pay | Admitting: Physical Therapy

## 2020-05-23 ENCOUNTER — Other Ambulatory Visit: Payer: Self-pay

## 2020-05-23 ENCOUNTER — Ambulatory Visit: Payer: Medicare Other | Attending: Orthopedic Surgery | Admitting: Physical Therapy

## 2020-05-23 DIAGNOSIS — R262 Difficulty in walking, not elsewhere classified: Secondary | ICD-10-CM | POA: Diagnosis not present

## 2020-05-23 DIAGNOSIS — M6281 Muscle weakness (generalized): Secondary | ICD-10-CM | POA: Diagnosis not present

## 2020-05-23 DIAGNOSIS — M25562 Pain in left knee: Secondary | ICD-10-CM

## 2020-05-23 NOTE — Therapy (Signed)
[place], [place], [address] Phone: [phone]   Fax:  [phone]  Physical Therapy Treatment  Patient Details  Name: [name] MRN: [id number] Date of Birth: 19-Oct-1951 Referring Provider (PT): Clarene Reamer   Encounter Date: 05/23/2020   PT End of Session - 05/23/20 1746    Visit Number 2    Date for PT Re-Evaluation 07/13/20    PT Start Time 6962    PT Stop Time 1750    PT Time Calculation (min) 45 min    Activity Tolerance Patient tolerated treatment well;Patient limited by fatigue    Behavior During Therapy [facility] for tasks assessed/performed           Past Medical History:  Diagnosis Date  . Cancer ([place])    cecum  . Elevated liver function tests   . Esophageal varices ([place])   . Hypothyroidism   . Iron deficiency anemia   . Liver disease    chemotherapy complication, per pt, shunts placed to bypass liver  . Malignant neoplasm of cecum ([place])   . Portal hypertension ([place])   . Splenomegaly     Past Surgical History:  Procedure Laterality Date  . ESOPHAGEAL VARICE LIGATION    . HEMICOLECTOMY  01/08/03  . LIVER SURGERY     shunts placed after chemo complication  . SKIN FULL THICKNESS GRAFT N/A 09/12/2019   Procedure: debridement and FTSG to the nose from left upper arm;  Surgeon: Cindra Presume, MD;  Location: [facility];  Service: Plastics;  Laterality: N/A;  2 hours, please    There were no vitals filed for this visit.   Subjective Assessment - 05/23/20 1742    Subjective Pt reports she had MD follow up yesterday and was cleared to remove brace and for full weightbearing.    Currently in Pain? Yes    Pain Score 2     Pain Location Knee    Pain Orientation Left                             [facility] Adult PT Treatment/Exercise - 05/23/20 0001      Ambulation/Gait   Gait Comments gait 2 laps around clinic with RW, step through pattern.  x11ft without AD shuffling steps, trendelenburg, step to pattern, antallgic      Knee/Hip Exercises: Aerobic   Nustep L3 x 6 min LE only      Knee/Hip Exercises: Standing   Hip Abduction Both;1 set;10 reps    Abduction Limitations RW to stabilize    Other Standing Knee Exercises alt step taps 2x10 4" step while holding RW      Knee/Hip Exercises: Seated   Long Arc Quad Both;2 sets;10 reps;Weights    Long Arc Quad Weight 2 lbs.    Marching Both;2 sets;10 reps;Weights    Marching Limitations 2    Sit to Sand 1 set;5 reps;with UE support   pushing up from thighs     Modalities   Modalities Vasopneumatic      Vasopneumatic   Number Minutes Vasopneumatic  10 minutes    Vasopnuematic Location  Ankle   L ankle swollen today so applied vaso to this location   Vasopneumatic Pressure Low    Vasopneumatic Temperature  34                    PT Short Term Goals -  05/13/20 1744      PT SHORT TERM GOAL #1   Title Pt will be independent with HEP    Time 2    Period Weeks    Status New    Target Date 05/27/20      PT SHORT TERM GOAL #2   Title Pt will be compliant with PWB status and able to demonstrate gait with proper observance of WB status    Time 2    Period Weeks    Status New    Target Date 05/27/20             PT Long Term Goals - 05/13/20 1745      PT LONG TERM GOAL #1   Title Pt will demonstrate L knee flexion/extension equivalent to R knee    Time 6    Period Weeks    Status New    Target Date 06/24/20      PT LONG TERM GOAL #2   Title Pt will demonstrate LLE MMT equivalent to RLE    Time 6    Period Weeks    Target Date 06/24/20      PT LONG TERM GOAL #3   Title Pt will decrease TUG time to <15 sec with LRAD    Time 6    Period Weeks    Status New    Target Date 06/24/20      PT LONG TERM GOAL #4   Title Pt will ambulate over level/unlevel surfaces with no AD or LRAD >500 ft    Time 6    Period Weeks    Status New    Target Date  06/24/20      PT LONG TERM GOAL #5   Title Pt will ascend/descend 28 stairs independently with HR    Time 6    Period Weeks    Status New    Target Date 06/24/20                 Plan - 05/23/20 1747    Clinical Impression Statement Pt tolerated progression to TE well this rx. Pt received clearance from MD for full weightbearing through LLE and is no longer required to wear brace. Pt able to perform STS from mat table with occasional minA needed to attain full upright position. Pt ambulated with RW and without AD in clinic today. Much slower antalgic gait with trendelenburg and shuffling steps without AD. Recommended pt continue using RW for ambulation and we will work towards walking with no AD. Swelling of L ankle this rx so applied vaso to that location.    PT Treatment/Interventions ADLs/Self Care Home Management;Cryotherapy;Electrical Stimulation;Gait training;Stair training;Functional mobility training;Therapeutic activities;Therapeutic exercise;Balance training;Neuromuscular re-education;Manual techniques;Patient/family education;Passive range of motion;Vasopneumatic Device    PT Next Visit Plan gait training with and without AD, LE flexibility/strengthening to tolerance    Consulted and Agree with Plan of Care Patient           Patient will benefit from skilled therapeutic intervention in order to improve the following deficits and impairments:  Abnormal gait, Decreased range of motion, Difficulty walking, Decreased endurance, Decreased activity tolerance, Pain, Decreased balance, Hypomobility, Decreased mobility, Decreased strength  Visit Diagnosis: Acute pain of left knee  Muscle weakness (generalized)  Difficulty in walking, not elsewhere classified     Problem List Patient Active Problem List   Diagnosis Date Noted  . Murmur, cardiac 03/19/2020  . Tibial plateau fracture 03/12/2020  . Elevated blood pressure reading in office without  diagnosis of hypertension  03/12/2020  . Tibial plateau fracture, left, closed, initial encounter 03/12/2020  . Chronic pain of both knees 01/29/2020  . Morbid obesity ([place]) 01/29/2020  . Hypothyroidism 06/30/2019  . Basal cell carcinoma of nose 06/30/2019  . Colon cancer ([place]) 11/30/2011  . Hemorrhage of gastrointestinal tract 05/04/2011  . Esophageal varices ([place]) 06/12/2010  . Portal hypertension ([place]) 01/23/2008   Amador Cunas, PT, DPT Donald Prose Avni Traore 05/23/2020, 5:55 PM  [place] [address] [place], [place], [address] Phone: [phone]   Fax:  [phone]  Name: [name] MRN: [id number] Date of Birth: 07/15/1951

## 2020-05-28 ENCOUNTER — Other Ambulatory Visit: Payer: Self-pay

## 2020-05-28 ENCOUNTER — Ambulatory Visit: Payer: Medicare Other | Admitting: Physical Therapy

## 2020-05-28 ENCOUNTER — Encounter: Payer: Self-pay | Admitting: Physical Therapy

## 2020-05-28 DIAGNOSIS — M25562 Pain in left knee: Secondary | ICD-10-CM | POA: Diagnosis not present

## 2020-05-28 DIAGNOSIS — R262 Difficulty in walking, not elsewhere classified: Secondary | ICD-10-CM | POA: Diagnosis not present

## 2020-05-28 DIAGNOSIS — M6281 Muscle weakness (generalized): Secondary | ICD-10-CM

## 2020-05-28 NOTE — Therapy (Signed)
[place], [place], [address] Phone: [phone]   Fax:  [phone]  Physical Therapy Treatment  Patient Details  Name: [name] MRN: [id number] Date of Birth: 06-28-1951 Referring Provider (PT): Clarene Reamer   Encounter Date: 05/28/2020   PT End of Session - 05/28/20 1745    Visit Number 3    Date for PT Re-Evaluation 07/13/20    PT Start Time 1702    PT Stop Time 1744    PT Time Calculation (min) 42 min    Activity Tolerance Patient tolerated treatment well;Patient limited by fatigue    Behavior During Therapy [facility] for tasks assessed/performed           Past Medical History:  Diagnosis Date  . Cancer ([place])    cecum  . Elevated liver function tests   . Esophageal varices ([place])   . Hypothyroidism   . Iron deficiency anemia   . Liver disease    chemotherapy complication, per pt, shunts placed to bypass liver  . Malignant neoplasm of cecum ([place])   . Portal hypertension ([place])   . Splenomegaly     Past Surgical History:  Procedure Laterality Date  . ESOPHAGEAL VARICE LIGATION    . HEMICOLECTOMY  01/08/03  . LIVER SURGERY     shunts placed after chemo complication  . SKIN FULL THICKNESS GRAFT N/A 09/12/2019   Procedure: debridement and FTSG to the nose from left upper arm;  Surgeon: Cindra Presume, MD;  Location: [facility];  Service: Plastics;  Laterality: N/A;  2 hours, please    There were no vitals filed for this visit.   Subjective Assessment - 05/28/20 1705    Subjective Pt states that she was a little sore after last rx but soreness subsided after a couple of days    Currently in Pain? Yes    Pain Score 2     Pain Location Knee    Pain Orientation Left                             [facility] Adult PT Treatment/Exercise - 05/28/20 0001      Ambulation/Gait   Gait Comments gait x400 ft with RW; cues for equal step length and upright  posture      Knee/Hip Exercises: Aerobic   Nustep L5 x 6 min LE only      Knee/Hip Exercises: Standing   Heel Raises Both;2 sets;15 reps    Heel Raises Limitations 2#    Hip Abduction Both;1 set;10 reps    Abduction Limitations RW to stabilize; 2#    Hip Extension Both;1 set;10 reps    Extension Limitations 2#    Other Standing Knee Exercises alt step taps 2x10 4" step while holding RW      Knee/Hip Exercises: Seated   Long Arc Quad Both;2 sets;10 reps;Weights    Long Arc Quad Weight 2 lbs.    Marching Both;2 sets;10 reps;Weights    Marching Limitations 2    Sit to Sand 1 set;5 reps;with UE support                    PT Short Term Goals - 05/13/20 1744      PT SHORT TERM GOAL #1   Title Pt will be independent with HEP    Time 2    Period Weeks    Status New  Target Date 05/27/20      PT SHORT TERM GOAL #2   Title Pt will be compliant with PWB status and able to demonstrate gait with proper observance of WB status    Time 2    Period Weeks    Status New    Target Date 05/27/20             PT Long Term Goals - 05/13/20 1745      PT LONG TERM GOAL #1   Title Pt will demonstrate L knee flexion/extension equivalent to R knee    Time 6    Period Weeks    Status New    Target Date 06/24/20      PT LONG TERM GOAL #2   Title Pt will demonstrate LLE MMT equivalent to RLE    Time 6    Period Weeks    Target Date 06/24/20      PT LONG TERM GOAL #3   Title Pt will decrease TUG time to <15 sec with LRAD    Time 6    Period Weeks    Status New    Target Date 06/24/20      PT LONG TERM GOAL #4   Title Pt will ambulate over level/unlevel surfaces with no AD or LRAD >500 ft    Time 6    Period Weeks    Status New    Target Date 06/24/20      PT LONG TERM GOAL #5   Title Pt will ascend/descend 28 stairs independently with HR    Time 6    Period Weeks    Status New    Target Date 06/24/20                 Plan - 05/28/20 1745     Clinical Impression Statement Pt tolerated progression of TE well; some complaint of increased LLE pain with weightbearing ex's. Pt gait training with RW x400 ft with SBA; cues for equal step length and upright posture.    PT Treatment/Interventions ADLs/Self Care Home Management;Cryotherapy;Electrical Stimulation;Gait training;Stair training;Functional mobility training;Therapeutic activities;Therapeutic exercise;Balance training;Neuromuscular re-education;Manual techniques;Patient/family education;Passive range of motion;Vasopneumatic Device    PT Next Visit Plan gait training with and without AD, LE flexibility/strengthening to tolerance    Consulted and Agree with Plan of Care Patient           Patient will benefit from skilled therapeutic intervention in order to improve the following deficits and impairments:  Abnormal gait, Decreased range of motion, Difficulty walking, Decreased endurance, Decreased activity tolerance, Pain, Decreased balance, Hypomobility, Decreased mobility, Decreased strength  Visit Diagnosis: Acute pain of left knee  Muscle weakness (generalized)  Difficulty in walking, not elsewhere classified     Problem List Patient Active Problem List   Diagnosis Date Noted  . Murmur, cardiac 03/19/2020  . Tibial plateau fracture 03/12/2020  . Elevated blood pressure reading in office without diagnosis of hypertension 03/12/2020  . Tibial plateau fracture, left, closed, initial encounter 03/12/2020  . Chronic pain of both knees 01/29/2020  . Morbid obesity ([place]) 01/29/2020  . Hypothyroidism 06/30/2019  . Basal cell carcinoma of nose 06/30/2019  . Colon cancer ([place]) 11/30/2011  . Hemorrhage of gastrointestinal tract 05/04/2011  . Esophageal varices ([place]) 06/12/2010  . Portal hypertension ([place]) 01/23/2008   Amador Cunas, PT, DPT Donald Prose Draven Natter 05/28/2020, 5:49 PM  [place], [place], [address] Phone: [phone]  Fax:  [phone]  Name: [name] MRN: [id number] Date of Birth: 1951/03/24

## 2020-05-30 ENCOUNTER — Ambulatory Visit: Payer: Medicare Other | Admitting: Physical Therapy

## 2020-05-30 ENCOUNTER — Other Ambulatory Visit: Payer: Self-pay

## 2020-05-30 DIAGNOSIS — M25562 Pain in left knee: Secondary | ICD-10-CM

## 2020-05-30 DIAGNOSIS — R262 Difficulty in walking, not elsewhere classified: Secondary | ICD-10-CM

## 2020-05-30 DIAGNOSIS — M6281 Muscle weakness (generalized): Secondary | ICD-10-CM | POA: Diagnosis not present

## 2020-05-30 NOTE — Therapy (Signed)
[place], [place], [address] Phone: [phone]   Fax:  [phone]  Physical Therapy Treatment  Patient Details  Name: [name] MRN: [id number] Date of Birth: 1951-11-22 Referring Provider (PT): Clarene Reamer   Encounter Date: 05/30/2020   PT End of Session - 05/30/20 1744    Visit Number 4    Date for PT Re-Evaluation 07/13/20    PT Start Time 1700    PT Stop Time 1744    PT Time Calculation (min) 44 min           Past Medical History:  Diagnosis Date  . Cancer ([place])    cecum  . Elevated liver function tests   . Esophageal varices ([place])   . Hypothyroidism   . Iron deficiency anemia   . Liver disease    chemotherapy complication, per pt, shunts placed to bypass liver  . Malignant neoplasm of cecum ([place])   . Portal hypertension ([place])   . Splenomegaly     Past Surgical History:  Procedure Laterality Date  . ESOPHAGEAL VARICE LIGATION    . HEMICOLECTOMY  01/08/03  . LIVER SURGERY     shunts placed after chemo complication  . SKIN FULL THICKNESS GRAFT N/A 09/12/2019   Procedure: debridement and FTSG to the nose from left upper arm;  Surgeon: Cindra Presume, MD;  Location: [facility];  Service: Plastics;  Laterality: N/A;  2 hours, please    There were no vitals filed for this visit.   Subjective Assessment - 05/30/20 1700    Subjective Patient states she is a little sore from last session, but doing relatively well.    Pain Score 2     Pain Location Knee    Pain Orientation Left              [facility] PT Assessment - 05/30/20 0001      ROM / Strength   AROM / PROM / Strength AROM      AROM   Left Knee Extension 22    Left Knee Flexion 95                         [facility] Adult PT Treatment/Exercise - 05/30/20 0001      Knee/Hip Exercises: Aerobic   Nustep L5 x 6 min LE only      Knee/Hip Exercises: Standing   Hip Abduction  Both;15 reps;2 sets    Abduction Limitations RW to stabilize; 2#    Hip Extension Both;15 reps;2 sets    Extension Limitations 2#    Other Standing Knee Exercises marching and sidestepping with HHA      Knee/Hip Exercises: Seated   Long Arc Quad Both;2 sets;Weights;15 reps    Long Arc Quad Weight 2 lbs.    Hamstring Curl Strengthening;2 sets;15 reps;Other (comment)   yellow tband   Hamstring Limitations increasing pain in BIL knees                    PT Short Term Goals - 05/13/20 1744      PT SHORT TERM GOAL #1   Title Pt will be independent with HEP    Time 2    Period Weeks    Status New    Target Date 05/27/20      PT SHORT TERM GOAL #2   Title Pt will be compliant with PWB status and able  to demonstrate gait with proper observance of WB status    Time 2    Period Weeks    Status New    Target Date 05/27/20             PT Long Term Goals - 05/30/20 1750      PT LONG TERM GOAL #1   Title Pt will demonstrate L knee flexion/extension equivalent to R knee    Baseline -22extension, 95 flexion- tested in sitting at end of session.    Status On-going      PT LONG TERM GOAL #3   Title Pt will decrease TUG time to <15 sec with LRAD    Baseline 25 seconds with RW    Status On-going                 Plan - 05/30/20 1744    Clinical Impression Statement Patient tolerated progression of TE well with some c/o of increased L knee pain during hamstring curls and WB exercise. AROM tested in sitting towards end of session -22 knee extension and 95 flexion.    Personal Factors and Comorbidities Comorbidity 1    Comorbidities HTN    Examination-Activity Limitations Transfers;Locomotion Level;Squat;Stairs    Examination-Participation Restrictions Community Activity;Interpersonal Relationship    Rehab Potential Good    PT Frequency 2x / week    PT Duration 6 weeks    PT Treatment/Interventions ADLs/Self Care Home Management;Cryotherapy;Electrical  Stimulation;Gait training;Stair training;Functional mobility training;Therapeutic activities;Therapeutic exercise;Balance training;Neuromuscular re-education;Manual techniques;Patient/family education;Passive range of motion;Vasopneumatic Device    PT Next Visit Plan Gait, progress TE    Consulted and Agree with Plan of Care Patient           Patient will benefit from skilled therapeutic intervention in order to improve the following deficits and impairments:  Abnormal gait, Decreased range of motion, Difficulty walking, Decreased endurance, Decreased activity tolerance, Pain, Decreased balance, Hypomobility, Decreased mobility, Decreased strength  Visit Diagnosis: Acute pain of left knee  Muscle weakness (generalized)  Difficulty in walking, not elsewhere classified     Problem List Patient Active Problem List   Diagnosis Date Noted  . Murmur, cardiac 03/19/2020  . Tibial plateau fracture 03/12/2020  . Elevated blood pressure reading in office without diagnosis of hypertension 03/12/2020  . Tibial plateau fracture, left, closed, initial encounter 03/12/2020  . Chronic pain of both knees 01/29/2020  . Morbid obesity ([place]) 01/29/2020  . Hypothyroidism 06/30/2019  . Basal cell carcinoma of nose 06/30/2019  . Colon cancer ([place]) 11/30/2011  . Hemorrhage of gastrointestinal tract 05/04/2011  . Esophageal varices ([place]) 06/12/2010  . Portal hypertension ([place]) 01/23/2008    York Cerise, Perry 05/30/2020, 5:51 PM  [place], [place], [address] Phone: [phone]   Fax:  [phone]  Name: [name] MRN: [id number] Date of Birth: 31-Oct-1951

## 2020-06-03 ENCOUNTER — Other Ambulatory Visit: Payer: Self-pay

## 2020-06-03 ENCOUNTER — Ambulatory Visit: Payer: Medicare Other | Admitting: Physical Therapy

## 2020-06-03 ENCOUNTER — Encounter: Payer: Self-pay | Admitting: Physical Therapy

## 2020-06-03 DIAGNOSIS — R262 Difficulty in walking, not elsewhere classified: Secondary | ICD-10-CM | POA: Diagnosis not present

## 2020-06-03 DIAGNOSIS — M25562 Pain in left knee: Secondary | ICD-10-CM | POA: Diagnosis not present

## 2020-06-03 DIAGNOSIS — M6281 Muscle weakness (generalized): Secondary | ICD-10-CM | POA: Diagnosis not present

## 2020-06-03 NOTE — Therapy (Signed)
[place], [place], [address] Phone: [phone]   Fax:  [phone]  Physical Therapy Treatment  Patient Details  Name: [name] MRN: [id number] Date of Birth: 01-25-1951 Referring Provider (PT): Clarene Reamer   Encounter Date: 06/03/2020   PT End of Session - 06/03/20 1749    Visit Number 5    Date for PT Re-Evaluation 07/13/20    PT Start Time 7412    PT Stop Time 1753    PT Time Calculation (min) 48 min    Activity Tolerance Patient limited by pain    Behavior During Therapy [facility] for tasks assessed/performed           Past Medical History:  Diagnosis Date  . Cancer ([place])    cecum  . Elevated liver function tests   . Esophageal varices ([place])   . Hypothyroidism   . Iron deficiency anemia   . Liver disease    chemotherapy complication, per pt, shunts placed to bypass liver  . Malignant neoplasm of cecum ([place])   . Portal hypertension ([place])   . Splenomegaly     Past Surgical History:  Procedure Laterality Date  . ESOPHAGEAL VARICE LIGATION    . HEMICOLECTOMY  01/08/03  . LIVER SURGERY     shunts placed after chemo complication  . SKIN FULL THICKNESS GRAFT N/A 09/12/2019   Procedure: debridement and FTSG to the nose from left upper arm;  Surgeon: Cindra Presume, MD;  Location: [facility];  Service: Plastics;  Laterality: N/A;  2 hours, please    There were no vitals filed for this visit.   Subjective Assessment - 06/03/20 1705    Subjective Pt states that she was very sore and in a lot of pain after last rx; thinks she did too many reps and would like to ease up this rx.    Currently in Pain? Yes    Pain Score 5     Pain Location Knee    Pain Orientation Left              [facility] PT Assessment - 06/03/20 0001      Circumferential Edema   Circumferential - Right 49 cm    Circumferential - Left  53 cm                         [facility]  Adult PT Treatment/Exercise - 06/03/20 0001      Ambulation/Gait   Gait Comments gait x 50 ft RW cues to bend knee      Knee/Hip Exercises: Aerobic   Nustep L3 x 6 min LE only      Knee/Hip Exercises: Standing   Other Standing Knee Exercises alt step taps2x10 2" step RW to stabilize    Other Standing Knee Exercises STS 3x5 from elevated table w/ UE on thigh      Knee/Hip Exercises: Seated   Ball Squeeze x10 with 3 sec hold      Vasopneumatic   Number Minutes Vasopneumatic  10 minutes    Vasopnuematic Location  Knee    Vasopneumatic Pressure Low    Vasopneumatic Temperature  34                    PT Short Term Goals - 05/13/20 1744      PT SHORT TERM GOAL #1   Title Pt will be independent with HEP  Time 2    Period Weeks    Status New    Target Date 05/27/20      PT SHORT TERM GOAL #2   Title Pt will be compliant with PWB status and able to demonstrate gait with proper observance of WB status    Time 2    Period Weeks    Status New    Target Date 05/27/20             PT Long Term Goals - 05/30/20 1750      PT LONG TERM GOAL #1   Title Pt will demonstrate L knee flexion/extension equivalent to R knee    Baseline -22extension, 95 flexion- tested in sitting at end of session.    Status On-going      PT LONG TERM GOAL #3   Title Pt will decrease TUG time to <15 sec with LRAD    Baseline 25 seconds with RW    Status On-going                 Plan - 06/03/20 1749    Clinical Impression Statement Pt presents to clinic today with reports of increased pain since last rx. Pt demos swelling increased from 50 to 53 cm since time of eval. Pt was unable to tolerate much exercise today d/t reports of L knee pain. Pt reports unable to lie supine d/t pain with knee extension. Negative for redness/swelling in calf, neg for Homan's sign, no tenderness in calf. Educated pt on icing, exercising to tolerance, and importance of movement with pt verbalized  understanding. Continue to progress to tolerance next rx.    PT Treatment/Interventions ADLs/Self Care Home Management;Cryotherapy;Electrical Stimulation;Gait training;Stair training;Functional mobility training;Therapeutic activities;Therapeutic exercise;Balance training;Neuromuscular re-education;Manual techniques;Patient/family education;Passive range of motion;Vasopneumatic Device    PT Next Visit Plan Gait, progress TE    Consulted and Agree with Plan of Care Patient           Patient will benefit from skilled therapeutic intervention in order to improve the following deficits and impairments:  Abnormal gait, Decreased range of motion, Difficulty walking, Decreased endurance, Decreased activity tolerance, Pain, Decreased balance, Hypomobility, Decreased mobility, Decreased strength  Visit Diagnosis: Acute pain of left knee  Muscle weakness (generalized)  Difficulty in walking, not elsewhere classified     Problem List Patient Active Problem List   Diagnosis Date Noted  . Murmur, cardiac 03/19/2020  . Tibial plateau fracture 03/12/2020  . Elevated blood pressure reading in office without diagnosis of hypertension 03/12/2020  . Tibial plateau fracture, left, closed, initial encounter 03/12/2020  . Chronic pain of both knees 01/29/2020  . Morbid obesity ([place]) 01/29/2020  . Hypothyroidism 06/30/2019  . Basal cell carcinoma of nose 06/30/2019  . Colon cancer ([place]) 11/30/2011  . Hemorrhage of gastrointestinal tract 05/04/2011  . Esophageal varices ([place]) 06/12/2010  . Portal hypertension ([place]) 01/23/2008   Amador Cunas, PT, DPT Donald Prose Rafan Sanders 06/03/2020, 6:09 PM  [place], [place], [address] Phone: [phone]   Fax:  [phone]  Name: [name] MRN: [id number] Date of Birth: 08-Feb-1951

## 2020-06-06 ENCOUNTER — Ambulatory Visit: Payer: Medicare Other | Admitting: Physical Therapy

## 2020-06-06 ENCOUNTER — Other Ambulatory Visit: Payer: Self-pay

## 2020-06-06 DIAGNOSIS — M25562 Pain in left knee: Secondary | ICD-10-CM | POA: Diagnosis not present

## 2020-06-06 DIAGNOSIS — R262 Difficulty in walking, not elsewhere classified: Secondary | ICD-10-CM

## 2020-06-06 DIAGNOSIS — M6281 Muscle weakness (generalized): Secondary | ICD-10-CM

## 2020-06-06 NOTE — Therapy (Signed)
[place], [place], [address] Phone: [phone]   Fax:  [phone]  Physical Therapy Treatment  Patient Details  Name: [name] MRN: [id number] Date of Birth: 29-Jan-1951 Referring Provider (PT): Clarene Reamer   Encounter Date: 06/06/2020   PT End of Session - 06/06/20 1743    Visit Number 6    Date for PT Re-Evaluation 07/13/20    PT Start Time 1700    PT Stop Time 1742    PT Time Calculation (min) 42 min           Past Medical History:  Diagnosis Date   Cancer ([place])    cecum   Elevated liver function tests    Esophageal varices ([facility])    Hypothyroidism    Iron deficiency anemia    Liver disease    chemotherapy complication, per pt, shunts placed to bypass liver   Malignant neoplasm of cecum ([place])    Portal hypertension ([place])    Splenomegaly     Past Surgical History:  Procedure Laterality Date   ESOPHAGEAL VARICE LIGATION     HEMICOLECTOMY  01/08/03   LIVER SURGERY     shunts placed after chemo complication   SKIN FULL THICKNESS GRAFT N/A 09/12/2019   Procedure: debridement and FTSG to the nose from left upper arm;  Surgeon: Cindra Presume, MD;  Location: [facility];  Service: Plastics;  Laterality: N/A;  2 hours, please    There were no vitals filed for this visit.   Subjective Assessment - 06/06/20 1704    Subjective Patient states that she is feeling okay today. Hip flexion has been causing her pain in the sessions, and she has been icing and heating every day on her own.    Limitations Standing;Walking;House hold activities    Currently in Pain? Yes    Pain Score 2     Pain Location Knee    Pain Orientation Left                             [facility] Adult PT Treatment/Exercise - 06/06/20 0001      Ambulation/Gait   Ambulation/Gait Yes    Ambulation/Gait Assistance 5: Supervision    Ambulation/Gait Assistance  Details --    Ambulation Distance (Feet) 100 Feet   x2   Assistive device Rolling walker    Gait Pattern Step-through pattern;Decreased stance time - left;Decreased step length - left      Knee/Hip Exercises: Aerobic   Nustep L3 x 7 minutes      Knee/Hip Exercises: Standing   Hip Extension Both;1 set;10 reps;AROM;Stengthening      Knee/Hip Exercises: Seated   Long Arc Quad Both;2 sets;Weights;10 reps    Long Arc Quad Weight 2 lbs.    Heel Slides AROM;Strengthening;Both;15 reps;Other (comment)   piece of paper under foot                   PT Short Term Goals - 05/13/20 1744      PT SHORT TERM GOAL #1   Title Pt will be independent with HEP    Time 2    Period Weeks    Status New    Target Date 05/27/20      PT SHORT TERM GOAL #2   Title Pt will be compliant with PWB status and able to demonstrate gait with proper observance of WB status  Time 2    Period Weeks    Status New    Target Date 05/27/20             PT Long Term Goals - 06/06/20 1747      PT LONG TERM GOAL #4   Title Pt will ambulate over level/unlevel surfaces with no AD or LRAD >500 ft    Baseline 191ft x 2 level surfaces with RW    Status On-going                 Plan - 06/06/20 1744    Clinical Impression Statement TE taken lighter today due to reports of feeling sore from last weeks session. Patient tolerated TE well with increasing pain only with heel slides. Patient ambulated 100 ft x2 with gait deviations increasing as fatigued. Patient fatigued greatly as session progressed, unable to MMT at end. Note to test ROM and strength at beginning of session.    Personal Factors and Comorbidities Comorbidity 1    Comorbidities HTN    Examination-Activity Limitations Transfers;Locomotion Level;Squat;Stairs    Examination-Participation Restrictions Community Activity;Interpersonal Relationship    Rehab Potential Good    PT Frequency 2x / week    PT Duration 6 weeks    PT  Treatment/Interventions ADLs/Self Care Home Management;Cryotherapy;Electrical Stimulation;Gait training;Stair training;Functional mobility training;Therapeutic activities;Therapeutic exercise;Balance training;Neuromuscular re-education;Manual techniques;Patient/family education;Passive range of motion;Vasopneumatic Device    PT Next Visit Plan Progress TE slowly. MMT and ROM at beginning of session as patient fatigues greatly by end of session.    Consulted and Agree with Plan of Care Patient           Patient will benefit from skilled therapeutic intervention in order to improve the following deficits and impairments:  Abnormal gait, Decreased range of motion, Difficulty walking, Decreased endurance, Decreased activity tolerance, Pain, Decreased balance, Hypomobility, Decreased mobility, Decreased strength  Visit Diagnosis: Acute pain of left knee  Muscle weakness (generalized)  Difficulty in walking, not elsewhere classified     Problem List Patient Active Problem List   Diagnosis Date Noted   Murmur, cardiac 03/19/2020   Tibial plateau fracture 03/12/2020   Elevated blood pressure reading in office without diagnosis of hypertension 03/12/2020   Tibial plateau fracture, left, closed, initial encounter 03/12/2020   Chronic pain of both knees 01/29/2020   Morbid obesity ([place]) 01/29/2020   Hypothyroidism 06/30/2019   Basal cell carcinoma of nose 06/30/2019   Colon cancer ([place]) 11/30/2011   Hemorrhage of gastrointestinal tract 05/04/2011   Esophageal varices ([place]) 06/12/2010   Portal hypertension ([place]) 01/23/2008    York Cerise, SPTA 06/06/2020, 5:48 PM  [place] [address] [place], [place], [address] Phone: [phone]   Fax:  [phone]  Name: [name] MRN: [id number] Date of Birth: 08-26-1951

## 2020-06-10 ENCOUNTER — Ambulatory Visit: Payer: Medicare Other | Admitting: Physical Therapy

## 2020-06-10 ENCOUNTER — Other Ambulatory Visit: Payer: Self-pay

## 2020-06-10 ENCOUNTER — Encounter: Payer: Self-pay | Admitting: Physical Therapy

## 2020-06-10 DIAGNOSIS — R262 Difficulty in walking, not elsewhere classified: Secondary | ICD-10-CM | POA: Diagnosis not present

## 2020-06-10 DIAGNOSIS — M25562 Pain in left knee: Secondary | ICD-10-CM

## 2020-06-10 DIAGNOSIS — M6281 Muscle weakness (generalized): Secondary | ICD-10-CM

## 2020-06-10 NOTE — Therapy (Signed)
[place], [place], [address] Phone: [phone]   Fax:  [phone]  Physical Therapy Treatment  Patient Details  Name: [name] MRN: [id number] Date of Birth: 05/31/1951 Referring Provider (PT): Clarene Reamer   Encounter Date: 06/10/2020   PT End of Session - 06/10/20 1745    Visit Number 7    Date for PT Re-Evaluation 07/13/20    PT Start Time 1700    PT Stop Time 1745    PT Time Calculation (min) 45 min    Activity Tolerance Patient limited by pain    Behavior During Therapy [facility] for tasks assessed/performed           Past Medical History:  Diagnosis Date  . Cancer ([place])    cecum  . Elevated liver function tests   . Esophageal varices ([place])   . Hypothyroidism   . Iron deficiency anemia   . Liver disease    chemotherapy complication, per pt, shunts placed to bypass liver  . Malignant neoplasm of cecum ([place])   . Portal hypertension ([place])   . Splenomegaly     Past Surgical History:  Procedure Laterality Date  . ESOPHAGEAL VARICE LIGATION    . HEMICOLECTOMY  01/08/03  . LIVER SURGERY     shunts placed after chemo complication  . SKIN FULL THICKNESS GRAFT N/A 09/12/2019   Procedure: debridement and FTSG to the nose from left upper arm;  Surgeon: Cindra Presume, MD;  Location: [facility];  Service: Plastics;  Laterality: N/A;  2 hours, please    There were no vitals filed for this visit.   Subjective Assessment - 06/10/20 1704    Subjective Pt states feeling much better this week; has still been using ice/heat for pain in legs    Currently in Pain? Yes    Pain Score 2     Pain Location Knee    Pain Orientation Left              [facility] PT Assessment - 06/10/20 0001      AROM   Left Knee Extension 18      PROM   Left Knee Extension 4                         [facility] Adult PT Treatment/Exercise - 06/10/20 0001      Ambulation/Gait    Ambulation/Gait Yes    Ambulation/Gait Assistance 5: Supervision    Ambulation Distance (Feet) 75 Feet    Assistive device Straight cane    Gait Pattern Step-to pattern    Ambulation Surface Level    Gait Comments cues for knee flexion; step to pattern first round, step through pattern 2nd round      Knee/Hip Exercises: Aerobic   Nustep L5 x 7 min LE only      Knee/Hip Exercises: Standing   Other Standing Knee Exercises alt step taps2x10 2" step RW to stabilize    Other Standing Knee Exercises STS 2x5 from mat table      Knee/Hip Exercises: Seated   Long Arc Quad Both;2 sets;Weights;10 reps    Long Arc Quad Weight 2 lbs.    Heel Slides Left;2 sets;10 reps    Heel Slides Limitations with pillowcase under foot    Ball Squeeze 2x10 with 3 sec hold    Marching Both;2 sets;10 reps;Weights    Marching Limitations 2   partial ROM  on LLE d/t pain                   PT Short Term Goals - 06/10/20 1704      PT SHORT TERM GOAL #1   Title Pt will be independent with HEP    Status Achieved      PT SHORT TERM GOAL #2   Title Pt will be compliant with PWB status and able to demonstrate gait with proper observance of WB status    Status Achieved             PT Long Term Goals - 06/10/20 1705      PT LONG TERM GOAL #1   Title Pt will demonstrate L knee flexion/extension equivalent to R knee    Status On-going      PT LONG TERM GOAL #2   Title Pt will demonstrate LLE MMT equivalent to RLE    Status On-going      PT LONG TERM GOAL #3   Title Pt will decrease TUG time to <15 sec with LRAD    Status On-going      PT LONG TERM GOAL #4   Title Pt will ambulate over level/unlevel surfaces with no AD or LRAD >500 ft    Baseline 153ft x 2 level surfaces with RW    Status On-going      PT LONG TERM GOAL #5   Title Pt will ascend/descend 28 stairs independently with HR    Status On-going                 Plan - 06/10/20 1746    Clinical Impression Statement Pt  continues to be limited by pain with exercise, particularly hip flexion. Pt has lost knee extension ROM since time of eval d/t increased reports of pain in quad. Pt progressing with ambulation; gait with SPC and CGA around clinic today. Step to pattern on 1st lap, step through pattern on 2nd lap with cues for L knee flexion.    PT Treatment/Interventions ADLs/Self Care Home Management;Cryotherapy;Electrical Stimulation;Gait training;Stair training;Functional mobility training;Therapeutic activities;Therapeutic exercise;Balance training;Neuromuscular re-education;Manual techniques;Patient/family education;Passive range of motion;Vasopneumatic Device    PT Next Visit Plan Progress TE slowly to tolerance. Work on gait with SPC.    Consulted and Agree with Plan of Care Patient           Patient will benefit from skilled therapeutic intervention in order to improve the following deficits and impairments:  Abnormal gait, Decreased range of motion, Difficulty walking, Decreased endurance, Decreased activity tolerance, Pain, Decreased balance, Hypomobility, Decreased mobility, Decreased strength  Visit Diagnosis: Acute pain of left knee  Muscle weakness (generalized)  Difficulty in walking, not elsewhere classified     Problem List Patient Active Problem List   Diagnosis Date Noted  . Murmur, cardiac 03/19/2020  . Tibial plateau fracture 03/12/2020  . Elevated blood pressure reading in office without diagnosis of hypertension 03/12/2020  . Tibial plateau fracture, left, closed, initial encounter 03/12/2020  . Chronic pain of both knees 01/29/2020  . Morbid obesity ([place]) 01/29/2020  . Hypothyroidism 06/30/2019  . Basal cell carcinoma of nose 06/30/2019  . Colon cancer ([place]) 11/30/2011  . Hemorrhage of gastrointestinal tract 05/04/2011  . Esophageal varices ([place]) 06/12/2010  . Portal hypertension ([place]) 01/23/2008   Amador Cunas, PT, DPT Donald Prose Hjalmar Ballengee 06/10/2020, 5:49 PM  [place], [place], [address] Phone: [phone]   Fax:  [phone]  Name: [name] MRN: [id number] Date of Birth: 05-23-1951

## 2020-06-13 ENCOUNTER — Other Ambulatory Visit: Payer: Self-pay

## 2020-06-13 ENCOUNTER — Ambulatory Visit: Payer: Medicare Other | Admitting: Physical Therapy

## 2020-06-13 ENCOUNTER — Encounter: Payer: Self-pay | Admitting: Physical Therapy

## 2020-06-13 DIAGNOSIS — M25562 Pain in left knee: Secondary | ICD-10-CM

## 2020-06-13 DIAGNOSIS — M6281 Muscle weakness (generalized): Secondary | ICD-10-CM

## 2020-06-13 DIAGNOSIS — R262 Difficulty in walking, not elsewhere classified: Secondary | ICD-10-CM | POA: Diagnosis not present

## 2020-06-13 DIAGNOSIS — S82143A Displaced bicondylar fracture of unspecified tibia, initial encounter for closed fracture: Secondary | ICD-10-CM | POA: Diagnosis not present

## 2020-06-13 NOTE — Therapy (Signed)
[place], [place], [address] Phone: [phone]   Fax:  [phone]  Physical Therapy Treatment  Patient Details  Name: [name] MRN: [id number] Date of Birth: [birth year] Referring Provider (PT): Clarene Reamer   Encounter Date: 06/13/2020   PT End of Session - 06/13/20 1751    Visit Number 8    Date for PT Re-Evaluation 07/13/20    PT Start Time 9326    PT Stop Time 1745    PT Time Calculation (min) 40 min    Activity Tolerance Patient limited by pain    Behavior During Therapy [facility] for tasks assessed/performed           Past Medical History:  Diagnosis Date  . Cancer ([place])    cecum  . Elevated liver function tests   . Esophageal varices ([place])   . Hypothyroidism   . Iron deficiency anemia   . Liver disease    chemotherapy complication, per pt, shunts placed to bypass liver  . Malignant neoplasm of cecum ([place])   . Portal hypertension ([place])   . Splenomegaly     Past Surgical History:  Procedure Laterality Date  . ESOPHAGEAL VARICE LIGATION    . HEMICOLECTOMY  01/08/03  . LIVER SURGERY     shunts placed after chemo complication  . SKIN FULL THICKNESS GRAFT N/A 09/12/2019   Procedure: debridement and FTSG to the nose from left upper arm;  Surgeon: Cindra Presume, MD;  Location: [facility];  Service: Plastics;  Laterality: N/A;  2 hours, please    There were no vitals filed for this visit.   Subjective Assessment - 06/13/20 1702    Subjective Pt states today/last night has been pretty tough; experiencing increased pain    Currently in Pain? Yes    Pain Score 5     Pain Location Knee    Pain Orientation Left                             [facility] Adult PT Treatment/Exercise - 06/13/20 0001      Ambulation/Gait   Ambulation/Gait Yes    Ambulation/Gait Assistance 5: Supervision    Ambulation Distance (Feet) 400 Feet    Assistive device  Straight cane    Gait Pattern Step-to pattern    Ambulation Surface Level      Knee/Hip Exercises: Aerobic   Nustep L5 x 7 min LE only      Knee/Hip Exercises: Standing   Other Standing Knee Exercises x3 step up/down from 4" box; x2 step up/down from 6" box HH x2    Other Standing Knee Exercises STS 2x5 from mat table                    PT Short Term Goals - 06/10/20 1704      PT SHORT TERM GOAL #1   Title Pt will be independent with HEP    Status Achieved      PT SHORT TERM GOAL #2   Title Pt will be compliant with PWB status and able to demonstrate gait with proper observance of WB status    Status Achieved             PT Long Term Goals - 06/10/20 1705      PT LONG TERM GOAL #1   Title Pt will demonstrate L knee flexion/extension equivalent to R  knee    Status On-going      PT LONG TERM GOAL #2   Title Pt will demonstrate LLE MMT equivalent to RLE    Status On-going      PT LONG TERM GOAL #3   Title Pt will decrease TUG time to <15 sec with LRAD    Status On-going      PT LONG TERM GOAL #4   Title Pt will ambulate over level/unlevel surfaces with no AD or LRAD >500 ft    Baseline 133ft x 2 level surfaces with RW    Status On-going      PT LONG TERM GOAL #5   Title Pt will ascend/descend 28 stairs independently with HR    Status On-going                 Plan - 06/13/20 1752    Clinical Impression Statement Pt demos gait with SPC >400' today with one seated rest break required. Cues for step through pattern, sequencing of AD, and knee flexion/foot clearance. No LOB; some instability with increasing fatigue. 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 Began stair progression today with step ups/downs to 4" and 6" box. Pt was hesitant/fearful but able to complete with [facility] assist; cues to clear LLE coming down step.    PT Treatment/Interventions ADLs/Self Care Home  Management;Cryotherapy;Electrical Stimulation;Gait training;Stair training;Functional mobility training;Therapeutic activities;Therapeutic exercise;Balance training;Neuromuscular re-education;Manual techniques;Patient/family education;Passive range of motion;Vasopneumatic Device    PT Next Visit Plan Progress TE slowly to tolerance. Work on gait with SPC. Stair progression    Consulted and Agree with Plan of Care Patient           Patient will benefit from skilled therapeutic intervention in order to improve the following deficits and impairments:  Abnormal gait, Decreased range of motion, Difficulty walking, Decreased endurance, Decreased activity tolerance, Pain, Decreased balance, Hypomobility, Decreased mobility, Decreased strength  Visit Diagnosis: Acute pain of left knee  Muscle weakness (generalized)  Difficulty in walking, not elsewhere classified     Problem List Patient Active Problem List   Diagnosis Date Noted  . Murmur, cardiac 03/19/2020  . Tibial plateau fracture 03/12/2020  . Elevated blood pressure reading in office without diagnosis of hypertension 03/12/2020  . Tibial plateau fracture, left, closed, initial encounter 03/12/2020  . Chronic pain of both knees 01/29/2020  . Morbid obesity ([place]) 01/29/2020  . Hypothyroidism 06/30/2019  . Basal cell carcinoma of nose 06/30/2019  . Colon cancer ([place]) 11/30/2011  . Hemorrhage of gastrointestinal tract 05/04/2011  . Esophageal varices ([place]) 06/12/2010  . Portal hypertension ([place]) 01/23/2008   Amador Cunas, PT, DPT Donald Prose Argusta Mcgann 06/13/2020, 5:54 PM  [place] [address] [place], [place], [address] Phone: [phone]   Fax:  [phone]  Name: Sharonlee Nine MRN: [id number] Date of Birth: 11-09-1951

## 2020-06-20 ENCOUNTER — Encounter: Payer: Self-pay | Admitting: Family Medicine

## 2020-06-20 ENCOUNTER — Other Ambulatory Visit: Payer: Self-pay

## 2020-06-20 ENCOUNTER — Encounter: Payer: Self-pay | Admitting: Physical Therapy

## 2020-06-20 ENCOUNTER — Ambulatory Visit: Payer: Medicare Other | Admitting: Physical Therapy

## 2020-06-20 DIAGNOSIS — M6281 Muscle weakness (generalized): Secondary | ICD-10-CM | POA: Diagnosis not present

## 2020-06-20 DIAGNOSIS — R262 Difficulty in walking, not elsewhere classified: Secondary | ICD-10-CM | POA: Diagnosis not present

## 2020-06-20 DIAGNOSIS — I10 Essential (primary) hypertension: Secondary | ICD-10-CM

## 2020-06-20 DIAGNOSIS — M25562 Pain in left knee: Secondary | ICD-10-CM | POA: Diagnosis not present

## 2020-06-20 NOTE — Therapy (Signed)
[place], [place], [address] Phone: [phone]   Fax:  [phone]  Physical Therapy Treatment  Patient Details  Name: [name] MRN: [id number] Date of Birth: 1951/01/16 Referring Provider (PT): Clarene Reamer   Encounter Date: 06/20/2020   PT End of Session - 06/20/20 1746    Visit Number 9    Date for PT Re-Evaluation 07/13/20    PT Start Time 1703    PT Stop Time 1745    PT Time Calculation (min) 42 min    Activity Tolerance Patient limited by pain    Behavior During Therapy [facility] for tasks assessed/performed           Past Medical History:  Diagnosis Date  . Cancer ([place])    cecum  . Elevated liver function tests   . Esophageal varices ([place])   . Hypothyroidism   . Iron deficiency anemia   . Liver disease    chemotherapy complication, per pt, shunts placed to bypass liver  . Malignant neoplasm of cecum ([place])   . Portal hypertension ([place])   . Splenomegaly     Past Surgical History:  Procedure Laterality Date  . ESOPHAGEAL VARICE LIGATION    . HEMICOLECTOMY  01/08/03  . LIVER SURGERY     shunts placed after chemo complication  . SKIN FULL THICKNESS GRAFT N/A 09/12/2019   Procedure: debridement and FTSG to the nose from left upper arm;  Surgeon: Cindra Presume, MD;  Location: [facility];  Service: Plastics;  Laterality: N/A;  2 hours, please    There were no vitals filed for this visit.   Subjective Assessment - 06/20/20 1707    Subjective Pt states that she is feeling better today    Currently in Pain? Yes    Pain Score 2     Pain Location Knee    Pain Orientation Left                             [facility] Adult PT Treatment/Exercise - 06/20/20 0001      Ambulation/Gait   Gait Comments cues for knee flexion; step to pattern first round, step through pattern 2nd round      Knee/Hip Exercises: Aerobic   Nustep L5 x 8 min LE only       Knee/Hip Exercises: Seated   Long Arc Quad Both;2 sets;Weights;10 reps    Long Arc Quad Weight 2 lbs.    Ball Squeeze 2x10 with 3 sec hold    Other Seated Knee/Hip Exercises heel raises/toe raises x10    Marching Both;2 sets;10 reps;Weights    Marching Limitations 2    Sit to Sand 2 sets;5 reps;with UE support   2nd set with foam pad                   PT Short Term Goals - 06/10/20 1704      PT SHORT TERM GOAL #1   Title Pt will be independent with HEP    Status Achieved      PT SHORT TERM GOAL #2   Title Pt will be compliant with PWB status and able to demonstrate gait with proper observance of WB status    Status Achieved             PT Long Term Goals - 06/10/20 1705      PT LONG TERM GOAL #1  Title Pt will demonstrate L knee flexion/extension equivalent to R knee    Status On-going      PT LONG TERM GOAL #2   Title Pt will demonstrate LLE MMT equivalent to RLE    Status On-going      PT LONG TERM GOAL #3   Title Pt will decrease TUG time to <15 sec with LRAD    Status On-going      PT LONG TERM GOAL #4   Title Pt will ambulate over level/unlevel surfaces with no AD or LRAD >500 ft    Baseline 120ft x 2 level surfaces with RW    Status On-going      PT LONG TERM GOAL #5   Title Pt will ascend/descend 28 stairs independently with HR    Status On-going                 Plan - 06/20/20 1747    Clinical Impression Statement Pt reporting decreased pain levels today but demos increasing fatigue; required frequent rest breaks this rx. Pt with eccentric weakness and shaking of LEs with STS ex's. Pt did well with step ups/downs to 4" stair; hesistant to move but minimal cuing required for LE/AD sequencing.    PT Treatment/Interventions ADLs/Self Care Home Management;Cryotherapy;Electrical Stimulation;Gait training;Stair training;Functional mobility training;Therapeutic activities;Therapeutic exercise;Balance training;Neuromuscular re-education;Manual  techniques;Patient/family education;Passive range of motion;Vasopneumatic Device    PT Next Visit Plan Progress TE slowly to tolerance. Work on gait with SPC. Stair progression    Consulted and Agree with Plan of Care Patient           Patient will benefit from skilled therapeutic intervention in order to improve the following deficits and impairments:  Abnormal gait, Decreased range of motion, Difficulty walking, Decreased endurance, Decreased activity tolerance, Pain, Decreased balance, Hypomobility, Decreased mobility, Decreased strength  Visit Diagnosis: Acute pain of left knee  Muscle weakness (generalized)  Difficulty in walking, not elsewhere classified     Problem List Patient Active Problem List   Diagnosis Date Noted  . Murmur, cardiac 03/19/2020  . Tibial plateau fracture 03/12/2020  . Elevated blood pressure reading in office without diagnosis of hypertension 03/12/2020  . Tibial plateau fracture, left, closed, initial encounter 03/12/2020  . Chronic pain of both knees 01/29/2020  . Morbid obesity ([place]) 01/29/2020  . Hypothyroidism 06/30/2019  . Basal cell carcinoma of nose 06/30/2019  . Colon cancer ([place]) 11/30/2011  . Hemorrhage of gastrointestinal tract 05/04/2011  . Esophageal varices ([place]) 06/12/2010  . Portal hypertension ([place]) 01/23/2008   Amador Cunas, PT, DPT Donald Prose Kashari Chalmers 06/20/2020, 5:50 PM  [place] [address] [place], [place], [address] Phone: [phone]   Fax:  [phone]  Name: [name] MRN: [id number] Date of Birth: 01/27/1951

## 2020-06-21 MED ORDER — LOSARTAN POTASSIUM 100 MG PO TABS: 100.0000 mg | ORAL_TABLET | Freq: Every day | ORAL | 0 refills | Status: DC

## 2020-06-24 ENCOUNTER — Encounter: Payer: Self-pay | Admitting: Physical Therapy

## 2020-06-24 ENCOUNTER — Other Ambulatory Visit: Payer: Self-pay

## 2020-06-24 ENCOUNTER — Ambulatory Visit: Payer: Medicare Other | Attending: Orthopedic Surgery | Admitting: Physical Therapy

## 2020-06-24 DIAGNOSIS — R262 Difficulty in walking, not elsewhere classified: Secondary | ICD-10-CM | POA: Insufficient documentation

## 2020-06-24 DIAGNOSIS — M25562 Pain in left knee: Secondary | ICD-10-CM | POA: Insufficient documentation

## 2020-06-24 DIAGNOSIS — S82142D Displaced bicondylar fracture of left tibia, subsequent encounter for closed fracture with routine healing: Secondary | ICD-10-CM | POA: Diagnosis not present

## 2020-06-24 DIAGNOSIS — M6281 Muscle weakness (generalized): Secondary | ICD-10-CM | POA: Diagnosis not present

## 2020-06-24 NOTE — Therapy (Signed)
[place], [place], [address] Phone: [phone]   Fax:  [phone]  Physical Therapy Treatment Progress Note Reporting Period 05/13/2020 to 06/24/2020  See note below for Objective Data and Assessment of Progress/Goals.      Patient Details  Name: [name] MRN: [id number] Date of Birth: 20-Jun-1951 Referring Provider (PT): Clarene Reamer   Encounter Date: 06/24/2020   PT End of Session - 06/24/20 1754    Visit Number 10    Date for PT Re-Evaluation 07/13/20    PT Start Time 1700    PT Stop Time 1745    PT Time Calculation (min) 45 min    Activity Tolerance Patient tolerated treatment well;Patient limited by fatigue    Behavior During Therapy [facility] for tasks assessed/performed;Anxious           Past Medical History:  Diagnosis Date  . Cancer ([place])    cecum  . Elevated liver function tests   . Esophageal varices ([place])   . Hypothyroidism   . Iron deficiency anemia   . Liver disease    chemotherapy complication, per pt, shunts placed to bypass liver  . Malignant neoplasm of cecum ([place])   . Portal hypertension ([place])   . Splenomegaly     Past Surgical History:  Procedure Laterality Date  . ESOPHAGEAL VARICE LIGATION    . HEMICOLECTOMY  01/08/03  . LIVER SURGERY     shunts placed after chemo complication  . SKIN FULL THICKNESS GRAFT N/A 09/12/2019   Procedure: debridement and FTSG to the nose from left upper arm;  Surgeon: Cindra Presume, MD;  Location: [facility];  Service: Plastics;  Laterality: N/A;  2 hours, please    There were no vitals filed for this visit.   Subjective Assessment - 06/24/20 1704    Subjective Pt states feeling good today; still having trouble lifting L leg    Currently in Pain? Yes    Pain Score 1     Pain Location Knee    Pain Orientation Left              [facility] PT Assessment - 06/24/20 0001      ROM / Strength   AROM /  PROM / Strength Strength      AROM   Left Knee Extension 13    Left Knee Flexion 100      PROM   Left Knee Extension 2    Left Knee Flexion 103      Strength   Overall Strength Comments 4/5 LLE; 4+/5 RLE      Palpation   Palpation comment tender to palpation joint line, quads                         [facility] Adult PT Treatment/Exercise - 06/24/20 0001      Ambulation/Gait   Ambulation/Gait Yes    Ambulation/Gait Assistance 5: Supervision    Ambulation Distance (Feet) 400 Feet    Assistive device Straight cane    Gait Pattern Step-through pattern    Ambulation Surface Level    Stairs Yes    Stairs Assistance 5: Supervision;4: Min guard    Stairs Assistance Details (indicate cue type and reason) CGA for ascending; supervision descending    Stair Management Technique One rail Left;Step to pattern;With cane    Number of Stairs 14    Height of Stairs 6  Gait Comments cues for sequencing on stairs      Knee/Hip Exercises: Aerobic   Nustep L5 x 7 min LE only      Knee/Hip Exercises: Standing   Heel Raises Both;1 set;15 reps    Hip Flexion Both;1 set;10 reps    Hip Abduction Both;1 set;10 reps                    PT Short Term Goals - 06/10/20 1704      PT SHORT TERM GOAL #1   Title Pt will be independent with HEP    Status Achieved      PT SHORT TERM GOAL #2   Title Pt will be compliant with PWB status and able to demonstrate gait with proper observance of WB status    Status Achieved             PT Long Term Goals - 06/24/20 1713      PT LONG TERM GOAL #1   Title Pt will demonstrate L knee flexion/extension equivalent to R knee    Baseline -13 extension, 102 flexion in sitting    Status Partially Met      PT LONG TERM GOAL #2   Title Pt will demonstrate LLE MMT equivalent to RLE    Baseline LLE: 4/5, RLE 4+/5    Status Partially Met      PT LONG TERM GOAL #3   Title Pt will decrease TUG time to <15 sec with LRAD    Baseline  52.82 with SPC; 22.67 sec with RW    Status On-going      PT LONG TERM GOAL #4   Title Pt will ambulate over level/unlevel surfaces with no AD or LRAD >500 ft    Baseline 400 ft level surfaces supervision assist w/ RW    Status Partially Met      PT LONG TERM GOAL #5   Title Pt will ascend/descend 28 stairs independently with HR    Baseline 14 stairs 6" step to pattern HR on L and CGA    Status Partially Met                 Plan - 06/24/20 1754    Clinical Impression Statement Pt is progressing towards goals. Pt working toward stair goal; able to ascend/descend one flight of stairs with CGA, 1 HR on left, step to pattern, and SPC. Pt is progressing with ROM and strength goals. Pt requires frequent rest breaks d/t fatigue.    PT Treatment/Interventions ADLs/Self Care Home Management;Cryotherapy;Electrical Stimulation;Gait training;Stair training;Functional mobility training;Therapeutic activities;Therapeutic exercise;Balance training;Neuromuscular re-education;Manual techniques;Patient/family education;Passive range of motion;Vasopneumatic Device    PT Next Visit Plan Progress TE slowly to tolerance. Work on gait with SPC. Stair progression    Consulted and Agree with Plan of Care Patient           Patient will benefit from skilled therapeutic intervention in order to improve the following deficits and impairments:  Abnormal gait, Decreased range of motion, Difficulty walking, Decreased endurance, Decreased activity tolerance, Pain, Decreased balance, Hypomobility, Decreased mobility, Decreased strength  Visit Diagnosis: Acute pain of left knee  Muscle weakness (generalized)  Difficulty in walking, not elsewhere classified     Problem List Patient Active Problem List   Diagnosis Date Noted  . Murmur, cardiac 03/19/2020  . Tibial plateau fracture 03/12/2020  . Elevated blood pressure reading in office without diagnosis of hypertension 03/12/2020  . Tibial plateau  fracture, left, closed, initial encounter 03/12/2020  .  Chronic pain of both knees 01/29/2020  . Morbid obesity ([place]) 01/29/2020  . Hypothyroidism 06/30/2019  . Basal cell carcinoma of nose 06/30/2019  . Colon cancer ([place]) 11/30/2011  . Hemorrhage of gastrointestinal tract 05/04/2011  . Esophageal varices ([place]) 06/12/2010  . Portal hypertension ([place]) 01/23/2008   Amador Cunas, PT, DPT Donald Prose Marvon [place] 06/24/2020, 5:57 PM  [place] [address] [address] [place], [place], [address] Phone: [phone]   Fax:  [phone]  Name: [name] MRN: [id number] Date of Birth: [birth year]

## 2020-06-27 ENCOUNTER — Ambulatory Visit: Payer: Medicare Other | Admitting: Physical Therapy

## 2020-06-27 ENCOUNTER — Encounter: Payer: Self-pay | Admitting: Physical Therapy

## 2020-06-27 ENCOUNTER — Other Ambulatory Visit: Payer: Self-pay

## 2020-06-27 DIAGNOSIS — R262 Difficulty in walking, not elsewhere classified: Secondary | ICD-10-CM

## 2020-06-27 DIAGNOSIS — M25562 Pain in left knee: Secondary | ICD-10-CM | POA: Diagnosis not present

## 2020-06-27 DIAGNOSIS — M6281 Muscle weakness (generalized): Secondary | ICD-10-CM | POA: Diagnosis not present

## 2020-06-27 NOTE — Therapy (Signed)
[place] [address] [place], [place], [address] Phone: [phone]   Fax:  [phone]  Physical Therapy Treatment  Patient Details  Name: [name] MRN: [id number] Date of Birth: February 10, 1951 Referring Provider (PT): Clarene Reamer   Encounter Date: 06/27/2020   PT End of Session - 06/27/20 1800    Visit Number 11    Date for PT Re-Evaluation 07/13/20    PT Start Time 5366    PT Stop Time 1750    PT Time Calculation (min) 43 min    Activity Tolerance Patient tolerated treatment well;Patient limited by fatigue    Behavior During Therapy [facility] for tasks assessed/performed;Anxious           Past Medical History:  Diagnosis Date  . Cancer ([place])    cecum  . Elevated liver function tests   . Esophageal varices ([place])   . Hypothyroidism   . Iron deficiency anemia   . Liver disease    chemotherapy complication, per pt, shunts placed to bypass liver  . Malignant neoplasm of cecum ([place])   . Portal hypertension ([place])   . Splenomegaly     Past Surgical History:  Procedure Laterality Date  . ESOPHAGEAL VARICE LIGATION    . HEMICOLECTOMY  01/08/03  . LIVER SURGERY     shunts placed after chemo complication  . SKIN FULL THICKNESS GRAFT N/A 09/12/2019   Procedure: debridement and FTSG to the nose from left upper arm;  Surgeon: Cindra Presume, MD;  Location: [facility];  Service: Plastics;  Laterality: N/A;  2 hours, please    There were no vitals filed for this visit.   Subjective Assessment - 06/27/20 1712    Subjective Pt reports feeling good today. 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    Currently in Pain? Yes    Pain Score 1     Pain Location Knee    Pain Orientation Left              [facility] PT Assessment - 06/27/20 0001      Assessment   Next MD Visit 07/24/2020                          [facility] Adult PT Treatment/Exercise - 06/27/20 0001      Ambulation/Gait   Gait Comments gait with SPC x400 ft down hall with supervision assist; cues for foot clearance and equal step length      Knee/Hip Exercises: Aerobic   Nustep L5 x 7 min LE only      Knee/Hip Exercises: Standing   Heel Raises Both;1 set;15 reps    Heel Raises Limitations 2#    Hip Flexion Both;1 set;10 reps    Hip Flexion Limitations 2#    Hip Abduction Both;1 set;10 reps    Abduction Limitations 2#      Knee/Hip Exercises: Seated   Long Arc Quad Both;2 sets;Weights;10 reps    Long Arc Quad Weight 2 lbs.    Marching Both;2 sets;10 reps;Weights    Marching Limitations 2    Sit to Sand 3 sets;5 reps;with UE support   foam mat to raise table height; pushing up from thighs                   PT Short Term Goals - 06/10/20  [place] #1   Title Pt will be independent with HEP    Status Achieved      PT SHORT TERM GOAL #2   Title Pt will be compliant with PWB status and able to demonstrate gait with proper observance of WB status    Status Achieved             PT Long Term Goals - 06/24/20 1713      PT LONG TERM GOAL #1   Title Pt will demonstrate L knee flexion/extension equivalent to R knee    Baseline -13 extension, 102 flexion in sitting    Status Partially Met      PT LONG TERM GOAL #2   Title Pt will demonstrate LLE MMT equivalent to RLE    Baseline LLE: 4/5, RLE 4+/5    Status Partially Met      PT LONG TERM GOAL #3   Title Pt will decrease TUG time to <15 sec with LRAD    Baseline 52.82 with SPC; 22.67 sec with RW    Status On-going      PT LONG TERM GOAL #4   Title Pt will ambulate over level/unlevel surfaces with no AD or LRAD >500 ft    Baseline 400 ft level surfaces supervision assist w/ RW    Status Partially Met      PT LONG TERM GOAL #5   Title Pt will ascend/descend 28 stairs independently with HR    Baseline 14  stairs 6" step to pattern HR on L and CGA    Status Partially Met                 Plan - 06/27/20 1801    Clinical Impression Statement Pt tolerated progression of TE well; trying to incorporate more standing ex's, patient requires frequent rest breaks d/t fatigue. Pt reports able to complete two flights of stairs at school to get to [employer] with supervision assist, 1 HR, and SPC. Working to gain confidence with SPC use; pt required cues for equal step length.    PT Treatment/Interventions ADLs/Self Care Home Management;Cryotherapy;Electrical Stimulation;Gait training;Stair training;Functional mobility training;Therapeutic activities;Therapeutic exercise;Balance training;Neuromuscular re-education;Manual techniques;Patient/family education;Passive range of motion;Vasopneumatic Device    PT Next Visit Plan Progress TE slowly to tolerance. Work on gait with SPC. Stair progression    Consulted and Agree with Plan of Care Patient           Patient will benefit from skilled therapeutic intervention in order to improve the following deficits and impairments:  Abnormal gait, Decreased range of motion, Difficulty walking, Decreased endurance, Decreased activity tolerance, Pain, Decreased balance, Hypomobility, Decreased mobility, Decreased strength  Visit Diagnosis: Acute pain of left knee  Muscle weakness (generalized)  Difficulty in walking, not elsewhere classified     Problem List Patient Active Problem List   Diagnosis Date Noted  . Murmur, cardiac 03/19/2020  . Tibial plateau fracture 03/12/2020  . Elevated blood pressure reading in office without diagnosis of hypertension 03/12/2020  . Tibial plateau fracture, left, closed, initial encounter 03/12/2020  . Chronic pain of both knees 01/29/2020  . Morbid obesity ([place]) 01/29/2020  . Hypothyroidism 06/30/2019  . Basal cell carcinoma of nose 06/30/2019  . Colon cancer ([place]) 11/30/2011  . Hemorrhage of gastrointestinal tract  05/04/2011  . Esophageal varices ([place]) 06/12/2010  . Portal hypertension ([place]) 01/23/2008   Amador Cunas, PT, DPT Donald Prose Culley Hedeen 06/27/2020, 6:03 PM  [place]  [address] [place] [place], [place], [address] Phone: [phone]   Fax:  [phone]  Name: [name] MRN: [id number] Date of Birth: May 10, 1951

## 2020-07-01 ENCOUNTER — Encounter: Payer: Self-pay | Admitting: Physical Therapy

## 2020-07-01 ENCOUNTER — Ambulatory Visit: Payer: Medicare Other | Admitting: Physical Therapy

## 2020-07-01 ENCOUNTER — Other Ambulatory Visit: Payer: Self-pay

## 2020-07-01 DIAGNOSIS — R262 Difficulty in walking, not elsewhere classified: Secondary | ICD-10-CM | POA: Diagnosis not present

## 2020-07-01 DIAGNOSIS — M25562 Pain in left knee: Secondary | ICD-10-CM | POA: Diagnosis not present

## 2020-07-01 DIAGNOSIS — M6281 Muscle weakness (generalized): Secondary | ICD-10-CM

## 2020-07-01 NOTE — Therapy (Signed)
[place] [address] [place], [place], [address] Phone: [phone]   Fax:  [phone]  Physical Therapy Treatment  Patient Details  Name: [name] MRN: [id number] Date of Birth: 1951/03/26 Referring Provider (PT): Clarene Reamer   Encounter Date: 07/01/2020   PT End of Session - 07/01/20 1748    Visit Number 12    Date for PT Re-Evaluation 07/13/20    PT Start Time 0962    PT Stop Time 8366    PT Time Calculation (min) 44 min    Activity Tolerance Patient tolerated treatment well;Patient limited by fatigue    Behavior During Therapy [facility] for tasks assessed/performed;Anxious           Past Medical History:  Diagnosis Date  . Cancer ([place])    cecum  . Elevated liver function tests   . Esophageal varices ([place])   . Hypothyroidism   . Iron deficiency anemia   . Liver disease    chemotherapy complication, per pt, shunts placed to bypass liver  . Malignant neoplasm of cecum ([place])   . Portal hypertension ([place])   . Splenomegaly     Past Surgical History:  Procedure Laterality Date  . ESOPHAGEAL VARICE LIGATION    . HEMICOLECTOMY  01/08/03  . LIVER SURGERY     shunts placed after chemo complication  . SKIN FULL THICKNESS GRAFT N/A 09/12/2019   Procedure: debridement and FTSG to the nose from left upper arm;  Surgeon: Cindra Presume, MD;  Location: [facility];  Service: Plastics;  Laterality: N/A;  2 hours, please    There were no vitals filed for this visit.   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 A little sore today from working all day.    Currently in Pain? Yes    Pain Score 1     Pain Location Knee    Pain Orientation Left                             [facility] Adult PT Treatment/Exercise - 07/01/20 0001      Ambulation/Gait   Gait Comments  gait with SPC x400 ft down hall with supervision assist; cues for foot clearance and equal step length      Knee/Hip Exercises: Aerobic   Recumbent Bike x4 min partial revolutions    Nustep L5 x 7 min LE only      Knee/Hip Exercises: Seated   Long Arc Quad Both;2 sets;Weights;10 reps    Long Arc Quad Weight 2 lbs.    Marching Both;2 sets;10 reps;Weights    Marching Limitations 2    Sit to [employer] 3 sets;5 reps;with UE support                    PT Short Term Goals - 06/10/20 1704      PT SHORT TERM GOAL #1   Title Pt will be independent with HEP    Status Achieved      PT SHORT TERM GOAL #2   Title Pt will be compliant with PWB status and able to demonstrate gait with proper observance of WB status    Status Achieved             PT Long Term Goals - 06/24/20 1713  PT LONG TERM GOAL #1   Title Pt will demonstrate L knee flexion/extension equivalent to R knee    Baseline -13 extension, 102 flexion in sitting    Status Partially Met      PT LONG TERM GOAL #2   Title Pt will demonstrate LLE MMT equivalent to RLE    Baseline LLE: 4/5, RLE 4+/5    Status Partially Met      PT LONG TERM GOAL #3   Title Pt will decrease TUG time to <15 sec with LRAD    Baseline 52.82 with SPC; 22.67 sec with RW    Status On-going      PT LONG TERM GOAL #4   Title Pt will ambulate over level/unlevel surfaces with no AD or LRAD >500 ft    Baseline 400 ft level surfaces supervision assist w/ RW    Status Partially Met      PT LONG TERM GOAL #5   Title Pt will ascend/descend 28 stairs independently with HR    Baseline 14 stairs 6" step to pattern HR on L and CGA    Status Partially Met                 Plan - 07/01/20 1749    Clinical Impression Statement Pt increasing in confidence with gait with SPC; requires cues for foot clearance, reciprocal arm swing, and equal step length. Pt still limited by fatigue, requires frequent rest breaks. Tried recumbent bike today; pt  able to complete partial revolutions; reports full revolutions increase L knee pain.    PT Treatment/Interventions ADLs/Self Care Home Management;Cryotherapy;Electrical Stimulation;Gait training;Stair training;Functional mobility training;Therapeutic activities;Therapeutic exercise;Balance training;Neuromuscular re-education;Manual techniques;Patient/family education;Passive range of motion;Vasopneumatic Device    PT Next Visit Plan Progress TE slowly to tolerance. Work on gait with SPC. Stair progression    Consulted and Agree with Plan of Care Patient           Patient will benefit from skilled therapeutic intervention in order to improve the following deficits and impairments:  Abnormal gait, Decreased range of motion, Difficulty walking, Decreased endurance, Decreased activity tolerance, Pain, Decreased balance, Hypomobility, Decreased mobility, Decreased strength  Visit Diagnosis: Acute pain of left knee  Muscle weakness (generalized)  Difficulty in walking, not elsewhere classified     Problem List Patient Active Problem List   Diagnosis Date Noted  . Murmur, cardiac 03/19/2020  . Tibial plateau fracture 03/12/2020  . Elevated blood pressure reading in office without diagnosis of hypertension 03/12/2020  . Tibial plateau fracture, left, closed, initial encounter 03/12/2020  . Chronic pain of both knees 01/29/2020  . Morbid obesity ([place]) 01/29/2020  . Hypothyroidism 06/30/2019  . Basal cell carcinoma of nose 06/30/2019  . Colon cancer ([place]) 11/30/2011  . Hemorrhage of gastrointestinal tract 05/04/2011  . Esophageal varices ([place]) 06/12/2010  . Portal hypertension ([place]) 01/23/2008   Amador Cunas, PT, DPT Donald Prose Lashaunda Schild 07/01/2020, 5:51 PM  [place] [address] [place], [place], [address] Phone: [phone]   Fax:  [phone]  Name: Paisyn Guercio MRN: [id number] Date of Birth: [birth year]

## 2020-07-04 ENCOUNTER — Other Ambulatory Visit: Payer: Self-pay

## 2020-07-04 ENCOUNTER — Encounter: Payer: Self-pay | Admitting: Physical Therapy

## 2020-07-04 ENCOUNTER — Ambulatory Visit: Payer: Medicare Other | Admitting: Physical Therapy

## 2020-07-04 DIAGNOSIS — M25562 Pain in left knee: Secondary | ICD-10-CM

## 2020-07-04 DIAGNOSIS — R262 Difficulty in walking, not elsewhere classified: Secondary | ICD-10-CM

## 2020-07-04 DIAGNOSIS — M6281 Muscle weakness (generalized): Secondary | ICD-10-CM

## 2020-07-04 NOTE — Therapy (Signed)
[name] [place], [place], [address] Phone: [phone]   Fax:  [phone]  Physical Therapy Treatment  Patient Details  Name: [name] MRN: [id number] Date of Birth: 1951-05-19 Referring Provider (PT): [name]   Encounter Date: 07/04/2020   PT End of Session - 07/04/20 1703    Visit Number 13    Date for PT Re-Evaluation 07/13/20    PT Start Time 1622    PT Stop Time 1701    PT Time Calculation (min) 39 min    Activity Tolerance Patient tolerated treatment well;Patient limited by fatigue    Behavior During Therapy [facility] for tasks assessed/performed;Anxious           Past Medical History:  Diagnosis Date  . Cancer ([place])    cecum  . Elevated liver function tests   . Esophageal varices ([place])   . Hypothyroidism   . Iron deficiency anemia   . Liver disease    chemotherapy complication, per pt, shunts placed to bypass liver  . Malignant neoplasm of cecum ([place])   . Portal hypertension ([place])   . Splenomegaly     Past Surgical History:  Procedure Laterality Date  . ESOPHAGEAL VARICE LIGATION    . HEMICOLECTOMY  01/08/03  . LIVER SURGERY     shunts placed after chemo complication  . SKIN FULL THICKNESS GRAFT N/A 09/12/2019   Procedure: debridement and FTSG to the nose from left upper arm;  Surgeon: Cindra Presume, MD;  Location: [facility];  Service: Plastics;  Laterality: N/A;  2 hours, please    There were no vitals filed for this visit.   Subjective Assessment - 07/04/20 1626    Subjective Pt reports that she is not having any pain today and has had less soreness after activity recently    Currently in Pain? No/denies              [facility] PT Assessment - 07/04/20 0001      AROM   Right/Left Knee Right    Right Knee Extension 13    Right Knee Flexion 106    Left Knee Extension 12    Left Knee Flexion 100      PROM   Left Knee Extension 0    Left  Knee Flexion 101      Timed Up and Go Test   Normal TUG (seconds) 40    TUG Comments 40 sec with SPC; 24 sec with RW                         [facility] Adult PT Treatment/Exercise - 07/04/20 0001      High Level Balance   High Level Balance Activities Side stepping;Backward walking      Knee/Hip Exercises: Aerobic   Nustep L5 x 7 min LE only      Knee/Hip Exercises: Standing   Heel Raises Both;1 set;15 reps    Hip Flexion Both;1 set;10 reps    Hip Abduction Both;1 set;10 reps    Hip Extension Both;1 set;10 reps;AROM;Stengthening      Knee/Hip Exercises: Seated   Sit to Sand 3 sets;5 reps;with UE support                    PT Short Term Goals - 06/10/20 1704      PT SHORT TERM GOAL #1   Title Pt will be independent with HEP  Status Achieved      PT SHORT TERM GOAL #2   Title Pt will be compliant with PWB status and able to demonstrate gait with proper observance of WB status    Status Achieved             PT Long Term Goals - 07/04/20 1705      PT LONG TERM GOAL #1   Title Pt will demonstrate L knee flexion/extension equivalent to R knee    Baseline -13 extension, 102 flexion in sitting    Status Partially Met      PT LONG TERM GOAL #2   Title Pt will demonstrate LLE MMT equivalent to RLE    Baseline LLE: 4/5, RLE 4+/5    Status Partially Met      PT LONG TERM GOAL #3   Title Pt will decrease TUG time to <15 sec with LRAD    Baseline 40 with SPC; 22 with RW    Status On-going      PT LONG TERM GOAL #4   Title Pt will ambulate over level/unlevel surfaces with no AD or LRAD >500 ft    Baseline 400 ft level surfaces supervision-CGA assist with SPC    Status Partially Met      PT LONG TERM GOAL #5   Title Pt will ascend/descend 28 stairs independently with HR    Baseline supervision assist    Status Partially Met                 Plan - 07/04/20 1704    Clinical Impression Statement Pt demos improved TUG time with SPC and L  knee flexion/extension ROM almost equivalent to R knee. Still limited by fatigue; requires frequent rest breaks. Initiated some higher level balance activities today; pt requires [place] assist to complete sidestepping and fwd/bkwd stepping.    PT Treatment/Interventions ADLs/Self Care Home Management;Cryotherapy;Electrical Stimulation;Gait training;Stair training;Functional mobility training;Therapeutic activities;Therapeutic exercise;Balance training;Neuromuscular re-education;Manual techniques;Patient/family education;Passive range of motion;Vasopneumatic Device    PT Next Visit Plan Progress TE slowly to tolerance. Work on gait with SPC. Stair progression    Consulted and Agree with Plan of Care Patient           Patient will benefit from skilled therapeutic intervention in order to improve the following deficits and impairments:  Abnormal gait, Decreased range of motion, Difficulty walking, Decreased endurance, Decreased activity tolerance, Pain, Decreased balance, Hypomobility, Decreased mobility, Decreased strength  Visit Diagnosis: Acute pain of left knee  Muscle weakness (generalized)  Difficulty in walking, not elsewhere classified     Problem List Patient Active Problem List   Diagnosis Date Noted  . Murmur, cardiac 03/19/2020  . Tibial plateau fracture 03/12/2020  . Elevated blood pressure reading in office without diagnosis of hypertension 03/12/2020  . Tibial plateau fracture, left, closed, initial encounter 03/12/2020  . Chronic pain of both knees 01/29/2020  . Morbid obesity ([place]) 01/29/2020  . Hypothyroidism 06/30/2019  . Basal cell carcinoma of nose 06/30/2019  . Colon cancer ([place]) 11/30/2011  . Hemorrhage of gastrointestinal tract 05/04/2011  . Esophageal varices ([place]) 06/12/2010  . Portal hypertension ([place]) 01/23/2008   Amador Cunas, PT, DPT Donald Prose Roxy Mastandrea 07/04/2020, 5:08 PM  [address]  [place], [place], [address] Phone: [phone]   Fax:  [phone]  Name: [name] MRN: [id number] Date of Birth: [birth year]

## 2020-07-08 ENCOUNTER — Ambulatory Visit: Payer: Medicare Other | Admitting: Physical Therapy

## 2020-07-08 ENCOUNTER — Other Ambulatory Visit: Payer: Self-pay

## 2020-07-08 ENCOUNTER — Encounter: Payer: Self-pay | Admitting: Physical Therapy

## 2020-07-08 DIAGNOSIS — M6281 Muscle weakness (generalized): Secondary | ICD-10-CM

## 2020-07-08 DIAGNOSIS — M25562 Pain in left knee: Secondary | ICD-10-CM

## 2020-07-08 DIAGNOSIS — R262 Difficulty in walking, not elsewhere classified: Secondary | ICD-10-CM

## 2020-07-08 NOTE — Therapy (Signed)
[place] [address] [place], [place], [address] Phone: [phone]   Fax:  [phone]  Physical Therapy Treatment  Patient Details  Name: [name] MRN: [id number] Date of Birth: 12/26/1950 Referring Provider (PT): Clarene Reamer   Encounter Date: 07/08/2020   PT End of Session - 07/08/20 1747    Visit Number 14    Date for PT Re-Evaluation 07/13/20    PT Start Time 1702    PT Stop Time 1746    PT Time Calculation (min) 44 min    Activity Tolerance Patient tolerated treatment well;Patient limited by fatigue    Behavior During Therapy [facility] for tasks assessed/performed;Anxious           Past Medical History:  Diagnosis Date  . Cancer ([place])    cecum  . Elevated liver function tests   . Esophageal varices ([place])   . Hypothyroidism   . Iron deficiency anemia   . Liver disease    chemotherapy complication, per pt, shunts placed to bypass liver  . Malignant neoplasm of cecum ([place])   . Portal hypertension ([place])   . Splenomegaly     Past Surgical History:  Procedure Laterality Date  . ESOPHAGEAL VARICE LIGATION    . HEMICOLECTOMY  01/08/03  . LIVER SURGERY     shunts placed after chemo complication  . SKIN FULL THICKNESS GRAFT N/A 09/12/2019   Procedure: debridement and FTSG to the nose from left upper arm;  Surgeon: Cindra Presume, MD;  Location: [facility];  Service: Plastics;  Laterality: N/A;  2 hours, please    There were no vitals filed for this visit.   Subjective Assessment - 07/08/20 1706    Subjective Pt reports she is doing well. Walks into clinic with RW today. 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 No/denies    Pain Score 0-No pain                             [facility] Adult PT Treatment/Exercise - 07/08/20 0001      Knee/Hip Exercises: Aerobic   Recumbent Bike x4 min partial revolutions    Nustep L5 x 7 min LE only       Knee/Hip Exercises: Machines for Strengthening   Cybex Knee Extension 5# 2x10   with assist for AROM   Cybex Knee Flexion 10# 2x10      Knee/Hip Exercises: Seated   Ball Squeeze 2x10 with 3 sec hold    Marching Both;2 sets;10 reps;Weights                    PT Short Term Goals - 06/10/20 1704      PT SHORT TERM GOAL #1   Title Pt will be independent with HEP    Status Achieved      PT SHORT TERM GOAL #2   Title Pt will be compliant with PWB status and able to demonstrate gait with proper observance of WB status    Status Achieved             PT Long Term Goals - 07/04/20 1705      PT LONG TERM GOAL #1   Title Pt will demonstrate L knee flexion/extension equivalent to R knee    Baseline -13 extension, 102 flexion in sitting    Status Partially Met      PT  LONG TERM GOAL #2   Title Pt will demonstrate LLE MMT equivalent to RLE    Baseline LLE: 4/5, RLE 4+/5    Status Partially Met      PT LONG TERM GOAL #3   Title Pt will decrease TUG time to <15 sec with LRAD    Baseline 40 with SPC; 22 with RW    Status On-going      PT LONG TERM GOAL #4   Title Pt will ambulate over level/unlevel surfaces with no AD or LRAD >500 ft    Baseline 400 ft level surfaces supervision-CGA assist with SPC    Status Partially Met      PT LONG TERM GOAL #5   Title Pt will ascend/descend 28 stairs independently with HR    Baseline supervision assist    Status Partially Met                 Plan - 07/08/20 1747    Clinical Impression Statement Gentle progression to machine interventions this rx. Pt did well with knee flexion machine; needed AAROM for full ROM for knee extension. Continues to be limited in knee extension ROM by LE weakness and pain. Continue to progress to tolerance.    PT Treatment/Interventions ADLs/Self Care Home Management;Cryotherapy;Electrical Stimulation;Gait training;Stair training;Functional mobility training;Therapeutic activities;Therapeutic  exercise;Balance training;Neuromuscular re-education;Manual techniques;Patient/family education;Passive range of motion;Vasopneumatic Device    PT Next Visit Plan Progress TE slowly to tolerance. Work on gait with SPC. Stair progression    Consulted and Agree with Plan of Care Patient           Patient will benefit from skilled therapeutic intervention in order to improve the following deficits and impairments:  Abnormal gait, Decreased range of motion, Difficulty walking, Decreased endurance, Decreased activity tolerance, Pain, Decreased balance, Hypomobility, Decreased mobility, Decreased strength  Visit Diagnosis: Acute pain of left knee  Muscle weakness (generalized)  Difficulty in walking, not elsewhere classified     Problem List Patient Active Problem List   Diagnosis Date Noted  . Murmur, cardiac 03/19/2020  . Tibial plateau fracture 03/12/2020  . Elevated blood pressure reading in office without diagnosis of hypertension 03/12/2020  . Tibial plateau fracture, left, closed, initial encounter 03/12/2020  . Chronic pain of both knees 01/29/2020  . Morbid obesity ([place]) 01/29/2020  . Hypothyroidism 06/30/2019  . Basal cell carcinoma of nose 06/30/2019  . Colon cancer ([place]) 11/30/2011  . Hemorrhage of gastrointestinal tract 05/04/2011  . Esophageal varices ([place]) 06/12/2010  . Portal hypertension ([place]) 01/23/2008   Amador Cunas, PT, DPT Donald Prose Husam Hohn 07/08/2020, 5:49 PM  [place] [address] [place], [place], [address] Phone: [phone]   Fax:  [phone]  Name: Olita Takeshita MRN: [id number] Date of Birth: Oct 17, 1951

## 2020-07-11 ENCOUNTER — Ambulatory Visit: Payer: Medicare Other | Admitting: Physical Therapy

## 2020-07-11 ENCOUNTER — Encounter: Payer: Medicare Other | Admitting: Physical Therapy

## 2020-07-11 ENCOUNTER — Other Ambulatory Visit: Payer: Self-pay

## 2020-07-11 ENCOUNTER — Encounter: Payer: Self-pay | Admitting: Physical Therapy

## 2020-07-11 DIAGNOSIS — R262 Difficulty in walking, not elsewhere classified: Secondary | ICD-10-CM

## 2020-07-11 DIAGNOSIS — M25562 Pain in left knee: Secondary | ICD-10-CM | POA: Diagnosis not present

## 2020-07-11 DIAGNOSIS — M6281 Muscle weakness (generalized): Secondary | ICD-10-CM

## 2020-07-11 NOTE — Therapy (Signed)
[place], [place], [address] Phone: [phone]   Fax:  [phone]  Physical Therapy Treatment  Patient Details  Name: [name] MRN: [id number] Date of Birth: [birth year] Referring Provider (PT): Clarene Reamer   Encounter Date: 07/11/2020   PT End of Session - 07/11/20 1738    Visit Number 15    Date for PT Re-Evaluation 08/11/20    PT Start Time 1701    PT Stop Time 1743    PT Time Calculation (min) 42 min    Activity Tolerance Patient tolerated treatment well;Patient limited by fatigue    Behavior During Therapy [facility] for tasks assessed/performed           Past Medical History:  Diagnosis Date  . Cancer ([place])    cecum  . Elevated liver function tests   . Esophageal varices ([place])   . Hypothyroidism   . Iron deficiency anemia   . Liver disease    chemotherapy complication, per pt, shunts placed to bypass liver  . Malignant neoplasm of cecum ([place])   . Portal hypertension ([place])   . Splenomegaly     Past Surgical History:  Procedure Laterality Date  . ESOPHAGEAL VARICE LIGATION    . HEMICOLECTOMY  01/08/03  . LIVER SURGERY     shunts placed after chemo complication  . SKIN FULL THICKNESS GRAFT N/A 09/12/2019   Procedure: debridement and FTSG to the nose from left upper arm;  Surgeon: Cindra Presume, MD;  Location: [place];  Service: Plastics;  Laterality: N/A;  2 hours, please    There were no vitals filed for this visit.   Subjective Assessment - 07/11/20 1718    Subjective Pt reports she is a little achey from full day of work but feeling good overall.    Currently in Pain? Yes    Pain Score 1     Pain Location Knee    Pain Orientation Left;Right                             [facility] Adult PT Treatment/Exercise - 07/11/20 0001      Ambulation/Gait   Stairs Yes    Stairs Assistance 5: Supervision    Stairs Assistance Details  (indicate cue type and reason) cues for upright posture and decreased weight through UEs    Stair Management Technique One rail Left;With cane;Step to pattern    Number of Stairs 28    Height of Stairs 6    Gait Comments x 100 ft with SPC      Knee/Hip Exercises: Aerobic   Nustep L5 x 7 min LE only      Knee/Hip Exercises: Machines for Strengthening   Cybex Knee Flexion 10# 2x10 BLE   cues for full ROM     Knee/Hip Exercises: Seated   Long Arc Quad Both;2 sets;Weights;10 reps    Long Arc Quad Limitations 2.5#    Marching Both;2 sets;10 reps;Weights    Marching Limitations 2.5#                    PT Short Term Goals - 06/10/20 1704      PT SHORT TERM GOAL #1   Title Pt will be independent with HEP    Status Achieved      PT SHORT TERM GOAL #2   Title Pt will be compliant with PWB status and  able to demonstrate gait with proper observance of WB status    Status Achieved             PT Long Term Goals - 07/11/20 1717      PT LONG TERM GOAL #1   Title Pt will demonstrate L knee flexion/extension equivalent to R knee    Baseline -13 extension, 102 flexion in sitting    Period Weeks    Status Partially Met      PT LONG TERM GOAL #2   Title Pt will demonstrate LLE MMT equivalent to RLE    Status Partially Met      PT LONG TERM GOAL #3   Title Pt will decrease TUG time to <15 sec with LRAD    Status On-going      PT LONG TERM GOAL #4   Title Pt will ambulate over level/unlevel surfaces with no AD or LRAD >500 ft    Status Partially Met      PT LONG TERM GOAL #5   Title Pt will ascend/descend 28 stairs with supervision assist with HR    Status Achieved                 Plan - 07/11/20 1739    Clinical Impression Statement Pt able to ascend/descend two flights of stairs this rx with supervision assist. Pt demos step to pattern, excessive leaning on UEs, and forward flexed posture. Pt stable on stairs but functionally weak in LEs with difficulty with  hip flexion and eccentric control on descent. Reports able to successfully [employer] on 2nd floor of workplace. Pt requires cuing with knee ext/curls for full ROM; unable to complete knee ext on machine this rx.    PT Treatment/Interventions ADLs/Self Care Home Management;Cryotherapy;Electrical Stimulation;Gait training;Stair training;Functional mobility training;Therapeutic activities;Therapeutic exercise;Balance training;Neuromuscular re-education;Manual techniques;Patient/family education;Passive range of motion;Vasopneumatic Device    PT Next Visit Plan Progress TE to tolerance. Gait with SPC.    Consulted and Agree with Plan of Care Patient           Patient will benefit from skilled therapeutic intervention in order to improve the following deficits and impairments:  Abnormal gait, Decreased range of motion, Difficulty walking, Decreased endurance, Decreased activity tolerance, Pain, Decreased balance, Hypomobility, Decreased mobility, Decreased strength  Visit Diagnosis: Acute pain of left knee  Muscle weakness (generalized)  Difficulty in walking, not elsewhere classified     Problem List Patient Active Problem List   Diagnosis Date Noted  . Murmur, cardiac 03/19/2020  . Tibial plateau fracture 03/12/2020  . Elevated blood pressure reading in office without diagnosis of hypertension 03/12/2020  . Tibial plateau fracture, left, closed, initial encounter 03/12/2020  . Chronic pain of both knees 01/29/2020  . Morbid obesity ([place]) 01/29/2020  . Hypothyroidism 06/30/2019  . Basal cell carcinoma of nose 06/30/2019  . Colon cancer ([place]) 11/30/2011  . Hemorrhage of gastrointestinal tract 05/04/2011  . Esophageal varices ([place]) 06/12/2010  . Portal hypertension ([place]) 01/23/2008   Amador Cunas, PT, DPT Donald Prose Narciso [place] 07/11/2020, [place] [address] [place], [place], [address] Phone: [phone]    Fax:  [phone]  Name: [name] MRN: [id number] Date of Birth: [birth year]

## 2020-07-14 DIAGNOSIS — S82143A Displaced bicondylar fracture of unspecified tibia, initial encounter for closed fracture: Secondary | ICD-10-CM | POA: Diagnosis not present

## 2020-07-17 ENCOUNTER — Ambulatory Visit: Payer: Medicare Other | Admitting: Physical Therapy

## 2020-07-17 ENCOUNTER — Encounter: Payer: Self-pay | Admitting: Physical Therapy

## 2020-07-17 ENCOUNTER — Other Ambulatory Visit: Payer: Self-pay

## 2020-07-17 DIAGNOSIS — M6281 Muscle weakness (generalized): Secondary | ICD-10-CM

## 2020-07-17 DIAGNOSIS — M25562 Pain in left knee: Secondary | ICD-10-CM | POA: Diagnosis not present

## 2020-07-17 DIAGNOSIS — R262 Difficulty in walking, not elsewhere classified: Secondary | ICD-10-CM

## 2020-07-17 NOTE — Therapy (Signed)
[place], [place], [address] Phone: [phone]   Fax:  [phone]  Physical Therapy Treatment  Patient Details  Name: [name] MRN: [id number] Date of Birth: 1951/01/22 Referring Provider (PT): Clarene Reamer   Encounter Date: 07/17/2020   PT End of Session - 07/17/20 1754    Visit Number 16    Date for PT Re-Evaluation 08/11/20    PT Start Time 1653    PT Stop Time 1740    PT Time Calculation (min) 47 min    Activity Tolerance Patient tolerated treatment well;Patient limited by fatigue    Behavior During Therapy [facility] for tasks assessed/performed           Past Medical History:  Diagnosis Date  . Cancer ([place])    cecum  . Elevated liver function tests   . Esophageal varices ([place])   . Hypothyroidism   . Iron deficiency anemia   . Liver disease    chemotherapy complication, per pt, shunts placed to bypass liver  . Malignant neoplasm of cecum ([place])   . Portal hypertension ([place])   . Splenomegaly     Past Surgical History:  Procedure Laterality Date  . ESOPHAGEAL VARICE LIGATION    . HEMICOLECTOMY  01/08/03  . LIVER SURGERY     shunts placed after chemo complication  . SKIN FULL THICKNESS GRAFT N/A 09/12/2019   Procedure: debridement and FTSG to the nose from left upper arm;  Surgeon: Cindra Presume, MD;  Location: [place];  Service: Plastics;  Laterality: N/A;  2 hours, please    There were no vitals filed for this visit.   Subjective Assessment - 07/17/20 1703    Subjective Reports mentally fatigued, due to kids comeing back and having kids all week.  "really tired"    Currently in Pain? No/denies              [facility] PT Assessment - 07/17/20 0001      Timed Up and Go Test   Normal TUG (seconds) 34    TUG Comments with SPC                         [facility] Adult PT Treatment/Exercise - 07/17/20 0001      Ambulation/Gait   Gait  Comments walked with patient from the car to the mid hall using FWW, then into clinc with SPC and SBA, cues for big step with the right, HHA fast walking  with cues for bigger steps      High Level Balance   High Level Balance Activities Side stepping;Backward walking    High Level Balance Comments 4" toe touches solid surface, airex surface, head turns, ball toss, eyes closed      Knee/Hip Exercises: Aerobic   Nustep L5 x 7 min LE only                    PT Short Term Goals - 06/10/20 1704      PT SHORT TERM GOAL #1   Title Pt will be independent with HEP    Status Achieved      PT SHORT TERM GOAL #2   Title Pt will be compliant with PWB status and able to demonstrate gait with proper observance of WB status    Status Achieved             PT Long Term Goals -  07/11/20 1717      PT LONG TERM GOAL #1   Title Pt will demonstrate L knee flexion/extension equivalent to R knee    Baseline -13 extension, 102 flexion in sitting    Period Weeks    Status Partially Met      PT LONG TERM GOAL #2   Title Pt will demonstrate LLE MMT equivalent to RLE    Status Partially Met      PT LONG TERM GOAL #3   Title Pt will decrease TUG time to <15 sec with LRAD    Status On-going      PT LONG TERM GOAL #4   Title Pt will ambulate over level/unlevel surfaces with no AD or LRAD >500 ft    Status Partially Met      PT LONG TERM GOAL #5   Title Pt will ascend/descend 28 stairs with supervision assist with HR    Status Achieved                 Plan - 07/17/20 1755    Clinical Impression Statement Patient was tired today, I worked more on balance and trust of the leg and her confidence today.  Tried higher level balance activities, we did a TUG and she decreased the time by 6 seconds with the SPC.  She did well and felt good about the balance, she is still very timid but seemed to respond to challenging her balance    PT Next Visit Plan see how she felt with the balance  activities and continue to progress her functional gait    Consulted and Agree with Plan of Care Patient           Patient will benefit from skilled therapeutic intervention in order to improve the following deficits and impairments:  Abnormal gait, Decreased range of motion, Difficulty walking, Decreased endurance, Decreased activity tolerance, Pain, Decreased balance, Hypomobility, Decreased mobility, Decreased strength  Visit Diagnosis: Acute pain of left knee  Muscle weakness (generalized)  Difficulty in walking, not elsewhere classified     Problem List Patient Active Problem List   Diagnosis Date Noted  . Murmur, cardiac 03/19/2020  . Tibial plateau fracture 03/12/2020  . Elevated blood pressure reading in office without diagnosis of hypertension 03/12/2020  . Tibial plateau fracture, left, closed, initial encounter 03/12/2020  . Chronic pain of both knees 01/29/2020  . Morbid obesity ([place]) 01/29/2020  . Hypothyroidism 06/30/2019  . Basal cell carcinoma of nose 06/30/2019  . Colon cancer ([place]) 11/30/2011  . Hemorrhage of gastrointestinal tract 05/04/2011  . Esophageal varices ([place]) 06/12/2010  . Portal hypertension ([place]) 01/23/2008    Sumner Boast., PT 07/17/2020, 5:59 PM  [address] [place], [place], [address] Phone: [phone]   Fax:  [phone]  Name: [name] MRN: [id number] Date of Birth: November 28, 1950

## 2020-07-24 DIAGNOSIS — S82142D Displaced bicondylar fracture of left tibia, subsequent encounter for closed fracture with routine healing: Secondary | ICD-10-CM | POA: Diagnosis not present

## 2020-07-30 ENCOUNTER — Encounter: Payer: Self-pay | Admitting: Physical Therapy

## 2020-07-30 ENCOUNTER — Ambulatory Visit: Payer: Medicare Other | Attending: Orthopedic Surgery | Admitting: Physical Therapy

## 2020-07-30 ENCOUNTER — Other Ambulatory Visit: Payer: Self-pay

## 2020-07-30 DIAGNOSIS — M25562 Pain in left knee: Secondary | ICD-10-CM | POA: Insufficient documentation

## 2020-07-30 DIAGNOSIS — M6281 Muscle weakness (generalized): Secondary | ICD-10-CM | POA: Diagnosis not present

## 2020-07-30 DIAGNOSIS — R262 Difficulty in walking, not elsewhere classified: Secondary | ICD-10-CM | POA: Diagnosis not present

## 2020-07-30 NOTE — Therapy (Signed)
[place], [place], [address] Phone: [phone]   Fax:  [phone]  Physical Therapy Treatment  Patient Details  Name: [name] MRN: [id number] Date of Birth: 1951-05-08 Referring Provider (PT): Clarene Reamer   Encounter Date: 07/30/2020   PT End of Session - 07/30/20 1747    Visit Number 17    Date for PT Re-Evaluation 08/11/20    PT Start Time 1700    PT Stop Time 1747    PT Time Calculation (min) 47 min    Activity Tolerance Patient tolerated treatment well;Patient limited by fatigue    Behavior During Therapy [facility] for tasks assessed/performed           Past Medical History:  Diagnosis Date  . Cancer ([place])    cecum  . Elevated liver function tests   . Esophageal varices ([place])   . Hypothyroidism   . Iron deficiency anemia   . Liver disease    chemotherapy complication, per pt, shunts placed to bypass liver  . Malignant neoplasm of cecum ([place])   . Portal hypertension ([place])   . Splenomegaly     Past Surgical History:  Procedure Laterality Date  . ESOPHAGEAL VARICE LIGATION    . HEMICOLECTOMY  01/08/03  . LIVER SURGERY     shunts placed after chemo complication  . SKIN FULL THICKNESS GRAFT N/A 09/12/2019   Procedure: debridement and FTSG to the nose from left upper arm;  Surgeon: Cindra Presume, MD;  Location: [place];  Service: Plastics;  Laterality: N/A;  2 hours, please    There were no vitals filed for this visit.   Subjective Assessment - 07/30/20 1710    Subjective Pt reports feeling pretty good; states knee is more stiff than it is painful    Currently in Pain? No/denies    Pain Score 0-No pain                             [facility] Adult PT Treatment/Exercise - 07/30/20 0001      Ambulation/Gait   Gait Comments stairs in balance room x4 up/down with 2 HR and step to pattern; gait with RW from old building to clinic over curbs  with supervision assist      High Level Balance   High Level Balance Comments airex STS, rhomberg stance eyes open and eyes closed      Knee/Hip Exercises: Aerobic   Nustep L5 x 7 min LE only      Knee/Hip Exercises: Machines for Strengthening   Cybex Knee Extension 5# 2x10 with knee flexion stretch in between sets   partial ROM; cues for increased knee extension   Cybex Knee Flexion 15# 2x10 BLE      Knee/Hip Exercises: Standing   Gait Training gait x100 ft with no AD and occasional HH assist      Knee/Hip Exercises: Seated   Sit to Sand 2 sets;5 reps;with UE support   pushing up from thighs on raised mat table                   PT Short Term Goals - 06/10/20 1704      PT SHORT TERM GOAL #1   Title Pt will be independent with HEP    Status Achieved      PT SHORT TERM GOAL #2   Title Pt will be compliant with PWB  status and able to demonstrate gait with proper observance of WB status    Status Achieved             PT Long Term Goals - 07/11/20 1717      PT LONG TERM GOAL #1   Title Pt will demonstrate L knee flexion/extension equivalent to R knee    Baseline -13 extension, 102 flexion in sitting    Period Weeks    Status Partially Met      PT LONG TERM GOAL #2   Title Pt will demonstrate LLE MMT equivalent to RLE    Status Partially Met      PT LONG TERM GOAL #3   Title Pt will decrease TUG time to <15 sec with LRAD    Status On-going      PT LONG TERM GOAL #4   Title Pt will ambulate over level/unlevel surfaces with no AD or LRAD >500 ft    Status Partially Met      PT LONG TERM GOAL #5   Title Pt will ascend/descend 28 stairs with supervision assist with HR    Status Achieved                 Plan - 07/30/20 1748    Clinical Impression Statement Gait around clinic without AD this rx and supervision-HH assist with no LOB; cues for increased step length and increased foot clearance. Pt required CGA and encouragement for balance activities  but did well with ex's on airex. Leg extension/curls on machine with partial ROM for knee extension and cues for both for increased ROM. Continue to push function and strength.    PT Treatment/Interventions ADLs/Self Care Home Management;Cryotherapy;Electrical Stimulation;Gait training;Stair training;Functional mobility training;Therapeutic activities;Therapeutic exercise;Balance training;Neuromuscular re-education;Manual techniques;Patient/family education;Passive range of motion;Vasopneumatic Device    PT Next Visit Plan balance, functional gait, strengthening, ROM    Consulted and Agree with Plan of Care Patient           Patient will benefit from skilled therapeutic intervention in order to improve the following deficits and impairments:  Abnormal gait, Decreased range of motion, Difficulty walking, Decreased endurance, Decreased activity tolerance, Pain, Decreased balance, Hypomobility, Decreased mobility, Decreased strength  Visit Diagnosis: Acute pain of left knee  Muscle weakness (generalized)  Difficulty in walking, not elsewhere classified     Problem List Patient Active Problem List   Diagnosis Date Noted  . Murmur, cardiac 03/19/2020  . Tibial plateau fracture 03/12/2020  . Elevated blood pressure reading in office without diagnosis of hypertension 03/12/2020  . Tibial plateau fracture, left, closed, initial encounter 03/12/2020  . Chronic pain of both knees 01/29/2020  . Morbid obesity ([place]) 01/29/2020  . Hypothyroidism 06/30/2019  . Basal cell carcinoma of nose 06/30/2019  . Colon cancer ([place]) 11/30/2011  . Hemorrhage of gastrointestinal tract 05/04/2011  . Esophageal varices ([place]) 06/12/2010  . Portal hypertension ([place]) 01/23/2008   Amador Cunas, PT, DPT Donald Prose Kewon [place] 07/30/2020, 5:50 PM  [place] [address] [address] [place] [place], [place], [address] Phone: [phone]   Fax:  [phone]  Name: [name] MRN: [id number] Date of Birth: [birth year]

## 2020-07-31 ENCOUNTER — Other Ambulatory Visit (INDEPENDENT_AMBULATORY_CARE_PROVIDER_SITE_OTHER): Payer: Medicare Other

## 2020-07-31 DIAGNOSIS — E039 Hypothyroidism, unspecified: Secondary | ICD-10-CM | POA: Diagnosis not present

## 2020-08-01 ENCOUNTER — Other Ambulatory Visit (INDEPENDENT_AMBULATORY_CARE_PROVIDER_SITE_OTHER): Payer: Medicare Other

## 2020-08-01 ENCOUNTER — Ambulatory Visit: Payer: Medicare Other | Admitting: Physical Therapy

## 2020-08-01 ENCOUNTER — Encounter: Payer: Self-pay | Admitting: Physical Therapy

## 2020-08-01 ENCOUNTER — Other Ambulatory Visit: Payer: Self-pay

## 2020-08-01 DIAGNOSIS — M25562 Pain in left knee: Secondary | ICD-10-CM

## 2020-08-01 DIAGNOSIS — R7989 Other specified abnormal findings of blood chemistry: Secondary | ICD-10-CM | POA: Diagnosis not present

## 2020-08-01 DIAGNOSIS — R262 Difficulty in walking, not elsewhere classified: Secondary | ICD-10-CM | POA: Diagnosis not present

## 2020-08-01 DIAGNOSIS — M6281 Muscle weakness (generalized): Secondary | ICD-10-CM | POA: Diagnosis not present

## 2020-08-01 LAB — T4, FREE: Free T4: 0.79 ng/dL (ref 0.60–1.60)

## 2020-08-01 LAB — TSH: TSH: 4.95 u[IU]/mL — ABNORMAL HIGH (ref 0.35–4.50)

## 2020-08-01 LAB — T3, FREE: T3, Free: 2.4 pg/mL (ref 2.3–4.2)

## 2020-08-01 NOTE — Therapy (Signed)
[place], [place], [address] Phone: [phone]   Fax:  [phone]  Physical Therapy Treatment  Patient Details  Name: [name] MRN: [id number] Date of Birth: May 08, 1951 Referring Provider (PT): Clarene Reamer   Encounter Date: 08/01/2020   PT End of Session - 08/01/20 1745    Visit Number 18    Date for PT Re-Evaluation 08/11/20    PT Start Time 1702    PT Stop Time 1745    PT Time Calculation (min) 43 min    Activity Tolerance Patient tolerated treatment well;Patient limited by fatigue    Behavior During Therapy [facility] for tasks assessed/performed           Past Medical History:  Diagnosis Date  . Cancer ([place])    cecum  . Elevated liver function tests   . Esophageal varices ([place])   . Hypothyroidism   . Iron deficiency anemia   . Liver disease    chemotherapy complication, per pt, shunts placed to bypass liver  . Malignant neoplasm of cecum ([place])   . Portal hypertension ([place])   . Splenomegaly     Past Surgical History:  Procedure Laterality Date  . ESOPHAGEAL VARICE LIGATION    . HEMICOLECTOMY  01/08/03  . LIVER SURGERY     shunts placed after chemo complication  . SKIN FULL THICKNESS GRAFT N/A 09/12/2019   Procedure: debridement and FTSG to the nose from left upper arm;  Surgeon: Cindra Presume, MD;  Location: [facility];  Service: Plastics;  Laterality: N/A;  2 hours, please    There were no vitals filed for this visit.   Subjective Assessment - 08/01/20 1705    Subjective Pt reports feeling good today    Currently in Pain? No/denies    Pain Score 0-No pain    Pain Location Knee              [facility] PT Assessment - 08/01/20 0001      Transfers   Five time sit to stand comments  >30 sec                         [facility] Adult PT Treatment/Exercise - 08/01/20 0001      Knee/Hip Exercises: Aerobic   Nustep L5 x 7 min LE only       Knee/Hip Exercises: Machines for Strengthening   Cybex Knee Extension 5# 2x10 with knee flexion stretch in between sets    Cybex Knee Flexion 15# 2x10 BLE      Knee/Hip Exercises: Standing   Stairs x 4 step over step on 4" stairs and step to 6" stairs    Gait Training gait around clinic with no AD and supervision assist    Other Standing Knee Exercises alt step taps to 4" step no UE support    Other Standing Knee Exercises STS 2x5 from blue chair with foam pad underneath                    PT Short Term Goals - 06/10/20 1704      PT SHORT TERM GOAL #1   Title Pt will be independent with HEP    Status Achieved      PT SHORT TERM GOAL #2   Title Pt will be compliant with PWB status and able to demonstrate gait with proper observance of WB status    Status Achieved  PT Long Term Goals - 07/11/20 1717      PT LONG TERM GOAL #1   Title Pt will demonstrate L knee flexion/extension equivalent to R knee    Baseline -13 extension, 102 flexion in sitting    Period Weeks    Status Partially Met      PT LONG TERM GOAL #2   Title Pt will demonstrate LLE MMT equivalent to RLE    Status Partially Met      PT LONG TERM GOAL #3   Title Pt will decrease TUG time to <15 sec with LRAD    Status On-going      PT LONG TERM GOAL #4   Title Pt will ambulate over level/unlevel surfaces with no AD or LRAD >500 ft    Status Partially Met      PT LONG TERM GOAL #5   Title Pt will ascend/descend 28 stairs with supervision assist with HR    Status Achieved                 Plan - 08/01/20 1746    Clinical Impression Statement Continued working on gait without AD around clinic this rx; close supervision as pt is still very hesitant but no LOB and no HH assist needed. Worked on stairs step over step on 4" side and step over step ascending 6" side with 2 HR. Cues for foot clearance and increased hip/knee flexion with alt step taps. Pt continues to be hesitant with  balance activities but does fairly well with no LOB this rx.    PT Treatment/Interventions ADLs/Self Care Home Management;Cryotherapy;Electrical Stimulation;Gait training;Stair training;Functional mobility training;Therapeutic activities;Therapeutic exercise;Balance training;Neuromuscular re-education;Manual techniques;Patient/family education;Passive range of motion;Vasopneumatic Device    PT Next Visit Plan balance, functional gait, strengthening, ROM    Consulted and Agree with Plan of Care Patient           Patient will benefit from skilled therapeutic intervention in order to improve the following deficits and impairments:  Abnormal gait, Decreased range of motion, Difficulty walking, Decreased endurance, Decreased activity tolerance, Pain, Decreased balance, Hypomobility, Decreased mobility, Decreased strength  Visit Diagnosis: Acute pain of left knee  Muscle weakness (generalized)  Difficulty in walking, not elsewhere classified     Problem List Patient Active Problem List   Diagnosis Date Noted  . Murmur, cardiac 03/19/2020  . Tibial plateau fracture 03/12/2020  . Elevated blood pressure reading in office without diagnosis of hypertension 03/12/2020  . Tibial plateau fracture, left, closed, initial encounter 03/12/2020  . Chronic pain of both knees 01/29/2020  . Morbid obesity ([place]) 01/29/2020  . Hypothyroidism 06/30/2019  . Basal cell carcinoma of nose 06/30/2019  . Colon cancer ([place]) 11/30/2011  . Hemorrhage of gastrointestinal tract 05/04/2011  . Esophageal varices ([place]) 06/12/2010  . Portal hypertension ([place]) 01/23/2008   Amador Cunas, PT, DPT Donald Prose Vicky Schleich 08/01/2020, 5:48 PM  [place] [address] [place] [place], [place], [address] Phone: [phone]   Fax:  [phone]  Name: [name] MRN: [id number] Date of Birth: 01-Feb-1951

## 2020-08-04 MED ORDER — SYNTHROID 88 MCG PO TABS: 88.0000 ug | ORAL_TABLET | Freq: Every day | ORAL | 3 refills | Status: DC

## 2020-08-04 NOTE — Addendum Note (Signed)
Addended by: Clarene Reamer B on: 08/04/2020 04:44 PM   Modules accepted: Orders

## 2020-08-05 ENCOUNTER — Ambulatory Visit: Payer: Medicare Other | Admitting: Physical Therapy

## 2020-08-05 ENCOUNTER — Encounter: Payer: Self-pay | Admitting: Physical Therapy

## 2020-08-05 ENCOUNTER — Other Ambulatory Visit: Payer: Self-pay

## 2020-08-05 DIAGNOSIS — M6281 Muscle weakness (generalized): Secondary | ICD-10-CM

## 2020-08-05 DIAGNOSIS — R262 Difficulty in walking, not elsewhere classified: Secondary | ICD-10-CM | POA: Diagnosis not present

## 2020-08-05 DIAGNOSIS — M25562 Pain in left knee: Secondary | ICD-10-CM | POA: Diagnosis not present

## 2020-08-05 NOTE — Therapy (Signed)
[place], [place], [address] Phone: [phone]   Fax:  [phone]  Physical Therapy Treatment  Patient Details  Name: [name] MRN: [id number] Date of Birth: Oct 23, 1951 Referring Provider (PT): Clarene Reamer   Encounter Date: 08/05/2020   PT End of Session - 08/05/20 1739    Visit Number 19    Date for PT Re-Evaluation 08/11/20    PT Start Time 1700    PT Stop Time 1745    PT Time Calculation (min) 45 min    Activity Tolerance Patient tolerated treatment well;Patient limited by fatigue    Behavior During Therapy [facility] for tasks assessed/performed           Past Medical History:  Diagnosis Date  . Cancer ([place])    cecum  . Elevated liver function tests   . Esophageal varices ([place])   . Hypothyroidism   . Iron deficiency anemia   . Liver disease    chemotherapy complication, per pt, shunts placed to bypass liver  . Malignant neoplasm of cecum ([place])   . Portal hypertension ([place])   . Splenomegaly     Past Surgical History:  Procedure Laterality Date  . ESOPHAGEAL VARICE LIGATION    . HEMICOLECTOMY  01/08/03  . LIVER SURGERY     shunts placed after chemo complication  . SKIN FULL THICKNESS GRAFT N/A 09/12/2019   Procedure: debridement and FTSG to the nose from left upper arm;  Surgeon: Cindra Presume, MD;  Location: [facility];  Service: Plastics;  Laterality: N/A;  2 hours, please    There were no vitals filed for this visit.   Subjective Assessment - 08/05/20 1704    Subjective Pt reports a little sore/tired but doing well    Currently in Pain? Yes    Pain Score 1     Pain Location Knee    Pain Orientation Left                             [facility] Adult PT Treatment/Exercise - 08/05/20 0001      High Level Balance   High Level Balance Activities Backward walking   with HHA   High Level Balance Comments sidestepping, forward, and  backwards stepping on airex with HHA      Knee/Hip Exercises: Aerobic   Nustep L5 x 7 min LE only      Knee/Hip Exercises: Machines for Strengthening   Cybex Knee Extension 5# 2x10    Cybex Knee Flexion 15# 2x10 BLE      Knee/Hip Exercises: Seated   Sit to Sand 2 sets;5 reps;without UE support   from elevated mat table                   PT Short Term Goals - 06/10/20 1704      PT SHORT TERM GOAL #1   Title Pt will be independent with HEP    Status Achieved      PT SHORT TERM GOAL #2   Title Pt will be compliant with PWB status and able to demonstrate gait with proper observance of WB status    Status Achieved             PT Long Term Goals - 07/11/20 1717      PT LONG TERM GOAL #1   Title Pt will demonstrate L knee flexion/extension equivalent to R knee  Baseline -13 extension, 102 flexion in sitting    Period Weeks    Status Partially Met      PT LONG TERM GOAL #2   Title Pt will demonstrate LLE MMT equivalent to RLE    Status Partially Met      PT LONG TERM GOAL #3   Title Pt will decrease TUG time to <15 sec with LRAD    Status On-going      PT LONG TERM GOAL #4   Title Pt will ambulate over level/unlevel surfaces with no AD or LRAD >500 ft    Status Partially Met      PT LONG TERM GOAL #5   Title Pt will ascend/descend 28 stairs with supervision assist with HR    Status Achieved                 Plan - 08/05/20 1739    Clinical Impression Statement Progressed balance activities this rx with sidestepping, backwards walking, and multidirectional stepping onto airex. HHA progressing to supervision assist with directional stepping. HHA with stepping onto airex d/t pt hesitation and instability. Educated pt on use of SPC around the house to gain confidence for transitioning to [facility] at work.    PT Treatment/Interventions ADLs/Self Care Home Management;Cryotherapy;Electrical Stimulation;Gait training;Stair training;Functional mobility  training;Therapeutic activities;Therapeutic exercise;Balance training;Neuromuscular re-education;Manual techniques;Patient/family education;Passive range of motion;Vasopneumatic Device    PT Next Visit Plan balance, functional gait, strengthening, ROM    Consulted and Agree with Plan of Care Patient           Patient will benefit from skilled therapeutic intervention in order to improve the following deficits and impairments:  Abnormal gait, Decreased range of motion, Difficulty walking, Decreased endurance, Decreased activity tolerance, Pain, Decreased balance, Hypomobility, Decreased mobility, Decreased strength  Visit Diagnosis: Acute pain of left knee  Muscle weakness (generalized)  Difficulty in walking, not elsewhere classified     Problem List Patient Active Problem List   Diagnosis Date Noted  . Murmur, cardiac 03/19/2020  . Tibial plateau fracture 03/12/2020  . Elevated blood pressure reading in office without diagnosis of hypertension 03/12/2020  . Tibial plateau fracture, left, closed, initial encounter 03/12/2020  . Chronic pain of both knees 01/29/2020  . Morbid obesity ([place]) 01/29/2020  . Hypothyroidism 06/30/2019  . Basal cell carcinoma of nose 06/30/2019  . Colon cancer ([place]) 11/30/2011  . Hemorrhage of gastrointestinal tract 05/04/2011  . Esophageal varices ([place]) 06/12/2010  . Portal hypertension ([place]) 01/23/2008   Amador Cunas, PT, DPT Donald Prose Antrice Pal 08/05/2020, 5:42 PM  [place] [address] [place], [place], [address] Phone: [phone]   Fax:  [phone]  Name: [name] MRN: [id number] Date of Birth: [birth year]

## 2020-08-08 ENCOUNTER — Ambulatory Visit: Payer: Medicare Other | Admitting: Physical Therapy

## 2020-08-12 ENCOUNTER — Emergency Department (HOSPITAL_COMMUNITY): Payer: Medicare Other

## 2020-08-12 ENCOUNTER — Encounter (HOSPITAL_COMMUNITY): Payer: Self-pay | Admitting: Family Medicine

## 2020-08-12 ENCOUNTER — Encounter: Payer: Self-pay | Admitting: Physical Therapy

## 2020-08-12 ENCOUNTER — Telehealth: Payer: Self-pay

## 2020-08-12 ENCOUNTER — Other Ambulatory Visit: Payer: Self-pay

## 2020-08-12 ENCOUNTER — Inpatient Hospital Stay (HOSPITAL_COMMUNITY)
Admission: EM | Admit: 2020-08-12 | Discharge: 2020-08-17 | DRG: 177 | Disposition: A | Discharge status: Home / Self Care | Payer: Medicare Other | Attending: Family Medicine | Admitting: Family Medicine

## 2020-08-12 DIAGNOSIS — S82143A Displaced bicondylar fracture of unspecified tibia, initial encounter for closed fracture: Secondary | ICD-10-CM | POA: Diagnosis not present

## 2020-08-12 DIAGNOSIS — K529 Noninfective gastroenteritis and colitis, unspecified: Secondary | ICD-10-CM | POA: Diagnosis not present

## 2020-08-12 DIAGNOSIS — A09 Infectious gastroenteritis and colitis, unspecified: Secondary | ICD-10-CM | POA: Diagnosis not present

## 2020-08-12 DIAGNOSIS — K769 Liver disease, unspecified: Secondary | ICD-10-CM | POA: Diagnosis present

## 2020-08-12 DIAGNOSIS — E876 Hypokalemia: Secondary | ICD-10-CM | POA: Diagnosis present

## 2020-08-12 DIAGNOSIS — R8271 Bacteriuria: Secondary | ICD-10-CM | POA: Diagnosis present

## 2020-08-12 DIAGNOSIS — I361 Nonrheumatic tricuspid (valve) insufficiency: Secondary | ICD-10-CM | POA: Diagnosis not present

## 2020-08-12 DIAGNOSIS — Z8261 Family history of arthritis: Secondary | ICD-10-CM

## 2020-08-12 DIAGNOSIS — J1282 Pneumonia due to coronavirus disease 2019: Secondary | ICD-10-CM | POA: Diagnosis present

## 2020-08-12 DIAGNOSIS — R680 Hypothermia, not associated with low environmental temperature: Secondary | ICD-10-CM | POA: Diagnosis not present

## 2020-08-12 DIAGNOSIS — E039 Hypothyroidism, unspecified: Secondary | ICD-10-CM | POA: Diagnosis present

## 2020-08-12 DIAGNOSIS — R05 Cough: Secondary | ICD-10-CM | POA: Diagnosis not present

## 2020-08-12 DIAGNOSIS — Z79899 Other long term (current) drug therapy: Secondary | ICD-10-CM

## 2020-08-12 DIAGNOSIS — R739 Hyperglycemia, unspecified: Secondary | ICD-10-CM | POA: Diagnosis present

## 2020-08-12 DIAGNOSIS — Z8249 Family history of ischemic heart disease and other diseases of the circulatory system: Secondary | ICD-10-CM

## 2020-08-12 DIAGNOSIS — Z85828 Personal history of other malignant neoplasm of skin: Secondary | ICD-10-CM | POA: Diagnosis not present

## 2020-08-12 DIAGNOSIS — Z79890 Hormone replacement therapy (postmenopausal): Secondary | ICD-10-CM | POA: Diagnosis not present

## 2020-08-12 DIAGNOSIS — I10 Essential (primary) hypertension: Secondary | ICD-10-CM | POA: Diagnosis not present

## 2020-08-12 DIAGNOSIS — Z6837 Body mass index (BMI) 37.0-37.9, adult: Secondary | ICD-10-CM | POA: Diagnosis not present

## 2020-08-12 DIAGNOSIS — J9601 Acute respiratory failure with hypoxia: Secondary | ICD-10-CM | POA: Diagnosis not present

## 2020-08-12 DIAGNOSIS — K766 Portal hypertension: Secondary | ICD-10-CM | POA: Diagnosis present

## 2020-08-12 DIAGNOSIS — Z91041 Radiographic dye allergy status: Secondary | ICD-10-CM

## 2020-08-12 DIAGNOSIS — E861 Hypovolemia: Secondary | ICD-10-CM | POA: Diagnosis not present

## 2020-08-12 DIAGNOSIS — R7989 Other specified abnormal findings of blood chemistry: Secondary | ICD-10-CM | POA: Diagnosis present

## 2020-08-12 DIAGNOSIS — Z833 Family history of diabetes mellitus: Secondary | ICD-10-CM

## 2020-08-12 DIAGNOSIS — Z85038 Personal history of other malignant neoplasm of large intestine: Secondary | ICD-10-CM

## 2020-08-12 DIAGNOSIS — U071 COVID-19: Secondary | ICD-10-CM | POA: Diagnosis not present

## 2020-08-12 DIAGNOSIS — I959 Hypotension, unspecified: Secondary | ICD-10-CM | POA: Diagnosis present

## 2020-08-12 DIAGNOSIS — R0602 Shortness of breath: Secondary | ICD-10-CM | POA: Diagnosis not present

## 2020-08-12 LAB — COMPREHENSIVE METABOLIC PANEL
ALT: 41 U/L (ref 0–44)
AST: 128 U/L — ABNORMAL HIGH (ref 15–41)
Albumin: 2.6 g/dL — ABNORMAL LOW (ref 3.5–5.0)
Alkaline Phosphatase: 313 U/L — ABNORMAL HIGH (ref 38–126)
Anion gap: 12 (ref 5–15)
BUN: 7 mg/dL — ABNORMAL LOW (ref 8–23)
CO2: 21 mmol/L — ABNORMAL LOW (ref 22–32)
Calcium: 8.3 mg/dL — ABNORMAL LOW (ref 8.9–10.3)
Chloride: 106 mmol/L (ref 98–111)
Creatinine, Ser: 0.75 mg/dL (ref 0.44–1.00)
GFR calc Af Amer: >60 mL/min (ref >60)
GFR calc non Af Amer: >60 mL/min (ref >60)
Glucose, Bld: 115 mg/dL — ABNORMAL HIGH (ref 70–99)
Potassium: 3.5 mmol/L (ref 3.5–5.1)
Sodium: 139 mmol/L (ref 135–145)
Total Bilirubin: 1.7 mg/dL — ABNORMAL HIGH (ref 0.3–1.2)
Total Protein: 6.7 g/dL (ref 6.5–8.1)

## 2020-08-12 LAB — LIPASE, BLOOD: Lipase: 27 U/L (ref 11–51)

## 2020-08-12 LAB — CBC
HCT: 39.7 % (ref 36.0–46.0)
Hemoglobin: 13.4 g/dL (ref 12.0–15.0)
MCH: 32 pg (ref 26.0–34.0)
MCHC: 33.8 g/dL (ref 30.0–36.0)
MCV: 94.7 fL (ref 80.0–100.0)
Platelets: 188 10*3/uL (ref 150–400)
RBC: 4.19 MIL/uL (ref 3.87–5.11)
RDW: 15.2 % (ref 11.5–15.5)
WBC: 5.1 10*3/uL (ref 4.0–10.5)
nRBC: 0 % (ref 0.0–0.2)

## 2020-08-12 LAB — SARS CORONAVIRUS 2 BY RT PCR (HOSPITAL ORDER, PERFORMED IN ~~LOC~~ HOSPITAL LAB): SARS Coronavirus 2: POSITIVE — AB

## 2020-08-12 LAB — FIBRINOGEN: Fibrinogen: 717 mg/dL — ABNORMAL HIGH (ref 210–475)

## 2020-08-12 LAB — TRIGLYCERIDES: Triglycerides: 83 mg/dL (ref <150)

## 2020-08-12 LAB — LACTATE DEHYDROGENASE: LDH: 494 U/L — ABNORMAL HIGH (ref 98–192)

## 2020-08-12 MED ORDER — GUAIFENESIN-DM 100-10 MG/5ML PO SYRP
10.0000 mL | ORAL_SOLUTION | ORAL | Status: DC | PRN
  Administered 2020-08-15 – 2020-08-16 (×2): 10 mL via ORAL
  Filled 2020-08-12 (×2): qty 10

## 2020-08-12 MED ORDER — DEXAMETHASONE SODIUM PHOSPHATE 10 MG/ML IJ SOLN
6.0000 mg | INTRAMUSCULAR | Status: DC
  Administered 2020-08-13: 6 mg via INTRAVENOUS
  Filled 2020-08-12: qty 1

## 2020-08-12 MED ORDER — ONDANSETRON HCL 4 MG PO TABS: 4.0000 mg | ORAL_TABLET | Freq: Four times a day (QID) | ORAL | Status: DC | PRN

## 2020-08-12 MED ORDER — SODIUM CHLORIDE 0.9 % IV SOLN
200.0000 mg | Freq: Once | INTRAVENOUS | Status: AC
  Administered 2020-08-13: 200 mg via INTRAVENOUS
  Filled 2020-08-12: qty 40

## 2020-08-12 MED ORDER — ONDANSETRON HCL 4 MG/2ML IJ SOLN: 4.0000 mg | Freq: Four times a day (QID) | INTRAMUSCULAR | Status: DC | PRN

## 2020-08-12 MED ORDER — ENOXAPARIN SODIUM 40 MG/0.4ML ~~LOC~~ SOLN: 40.0000 mg | SUBCUTANEOUS | Status: DC

## 2020-08-12 MED ORDER — ACETAMINOPHEN 325 MG PO TABS
650.0000 mg | ORAL_TABLET | Freq: Four times a day (QID) | ORAL | Status: DC | PRN
  Administered 2020-08-15 – 2020-08-16 (×2): 650 mg via ORAL
  Filled 2020-08-12 (×2): qty 2

## 2020-08-12 MED ORDER — SODIUM CHLORIDE 0.9 % IV SOLN
100.0000 mg | Freq: Every day | INTRAVENOUS | Status: AC
  Administered 2020-08-14 – 2020-08-17 (×4): 100 mg via INTRAVENOUS
  Filled 2020-08-12 (×5): qty 20

## 2020-08-12 MED ORDER — SODIUM CHLORIDE 0.9 % IV BOLUS
1000.0000 mL | Freq: Once | INTRAVENOUS | Status: AC
  Administered 2020-08-12: 1000 mL via INTRAVENOUS

## 2020-08-12 MED ORDER — LEVOTHYROXINE SODIUM 88 MCG PO TABS
88.0000 ug | ORAL_TABLET | Freq: Every day | ORAL | Status: DC
  Administered 2020-08-13 – 2020-08-17 (×5): 88 ug via ORAL
  Filled 2020-08-12 (×5): qty 1

## 2020-08-12 NOTE — ED Triage Notes (Signed)
Pt hx "cold" past few weeks, symptoms include cough, SOB, nausea, vomitting, and worsening diahrrea. Doctor advised her to come here for fluids Pt satting at 89% on RA in triage, BP 100/46. Placed on 2 L 02

## 2020-08-12 NOTE — ED Provider Notes (Signed)
[facility] EMERGENCY DEPARTMENT Provider Note   CSN: [id number] Arrival date & time: 08/12/20  1640     History Chief Complaint  Patient presents with  . Nausea  . Shortness of Breath    [name] is a 69 y.o. female.  Patient c/o cough, body aches, diarrhea, general weakness for the past 10 days. Symptoms acute onset, moderate, worsening,  w non prod cough, ?ubjective fevers, pt thinking she may have a 'cold'. Symptoms have persisted, and slowly worsened over past couple days. Relatively poor po intake, her doctor advising her to come to ED for testing and ivf. No known covid exposure. Patient has not received covid vaccine. No sore throat or runny nose. No chest pain. +sob and generally weak, worse w exertion. No acute leg pain or swelling. No abd pain. +diarrhea stools. On room air, o2 sats 86-89%.   The history is provided by the patient.  Shortness of Breath Associated symptoms: cough and fever   Associated symptoms: no abdominal pain, no chest pain, no headaches, no neck pain, no rash, no sore throat and no vomiting        Past Medical History:  Diagnosis Date  . Cancer ([place])    cecum  . Elevated liver function tests   . Esophageal varices ([place])   . Hypothyroidism   . Iron deficiency anemia   . Liver disease    chemotherapy complication, per pt, shunts placed to bypass liver  . Malignant neoplasm of cecum ([place])   . Portal hypertension ([place])   . Splenomegaly     Patient Active Problem List   Diagnosis Date Noted  . Murmur, cardiac 03/19/2020  . Tibial plateau fracture 03/12/2020  . Elevated blood pressure reading in office without diagnosis of hypertension 03/12/2020  . Tibial plateau fracture, left, closed, initial encounter 03/12/2020  . Chronic pain of both knees 01/29/2020  . Morbid obesity ([place]) 01/29/2020  . Hypothyroidism 06/30/2019  . Basal cell carcinoma of nose 06/30/2019  . Colon cancer ([place]) 11/30/2011  . Hemorrhage of  gastrointestinal tract 05/04/2011  . Esophageal varices ([place]) 06/12/2010  . Portal hypertension ([place]) 01/23/2008    Past Surgical History:  Procedure Laterality Date  . ESOPHAGEAL VARICE LIGATION    . HEMICOLECTOMY  01/08/03  . LIVER SURGERY     shunts placed after chemo complication  . SKIN FULL THICKNESS GRAFT N/A 09/12/2019   Procedure: debridement and FTSG to the nose from left upper arm;  Surgeon: Cindra Presume, MD;  Location: [facility];  Service: Plastics;  Laterality: N/A;  2 hours, please     OB History   No obstetric history on file.     Family History  Problem Relation Age of Onset  . Arthritis Mother   . Hearing loss Mother   . Heart disease Mother   . Hypertension Mother   . Arthritis Father   . Diabetes Father   . Heart disease Father   . Breast cancer Neg Hx     Social History   Tobacco Use  . Smoking status: Never Smoker  . Smokeless tobacco: Never Used  Substance Use Topics  . Alcohol use: Never  . Drug use: Never    Home Medications Prior to Admission medications   Medication Sig Start Date End Date Taking? Authorizing Provider  Cholecalciferol (VITAMIN D3) 50 MCG (2000 UT) TABS Take 2,000 Units by mouth at bedtime.     [provider]  Cyanocobalamin (VITAMIN B-12) 5000 MCG  TBDP Take 5,000 mcg by mouth at bedtime.     [provider]  ELDERBERRY PO Take 5 mLs by mouth at bedtime.     [provider]  enoxaparin (LOVENOX) 60 MG/0.6ML injection Inject 0.5 mLs (50 mg total) into the skin daily for 21 days. 03/14/20 04/04/20  Ainsley Spinner, PA-C  Glucosamine-Chondroitin 1500-1200 MG/30ML LIQD Take 30 mLs by mouth at bedtime.     [provider]  ibuprofen (ADVIL) 400 MG tablet Take 1 tablet (400 mg total) by mouth 4 (four) times daily. 03/14/20   Nita Sells, MD  losartan (COZAAR) 100 MG tablet Take 1 tablet (100 mg total) by mouth daily. 06/21/20   Elby Beck, FNP  Multiple Vitamin  (MULTIVITAMIN WITH MINERALS) TABS tablet Take 1 tablet by mouth at bedtime.    [provider]  omega-3 acid ethyl esters (LOVAZA) 1 g capsule Take 1 g by mouth at bedtime.     [provider]  oxyCODONE-acetaminophen (PERCOCET/ROXICET) 5-325 MG tablet Take 1 tablet by mouth every 4 (four) hours as needed for moderate pain. 03/14/20   Nita Sells, MD  SYNTHROID 88 MCG tablet Take 1 tablet (88 mcg total) by mouth daily before breakfast. 08/04/20   Elby Beck, FNP  TURMERIC PO Take 1 tablet by mouth at bedtime.     [provider]  UNABLE TO FIND Take 1 tablet by mouth at bedtime. Med Name: Collagen-Biotin 2000mg      [provider]  VITAMIN A PO Take 1 tablet by mouth at bedtime.     [provider]  vitamin C (ASCORBIC ACID) 500 MG tablet Take 500 mg by mouth at bedtime.     [provider]  zinc gluconate 50 MG tablet Take 50 mg by mouth at bedtime.    [provider]    Allergies    Contrast media [iodinated diagnostic agents]  Review of Systems   Review of Systems  Constitutional: Positive for fever.  HENT: Negative for sore throat.   Eyes: Negative for redness.  Respiratory: Positive for cough and shortness of breath.   Cardiovascular: Negative for chest pain and leg swelling.  Gastrointestinal: Positive for diarrhea. Negative for abdominal pain and vomiting.  Genitourinary: Negative for dysuria and flank pain.  Musculoskeletal: Positive for myalgias. Negative for neck pain and neck stiffness.  Skin: Negative for rash.  Neurological: Negative for headaches.  Hematological: Does not bruise/bleed easily.  Psychiatric/Behavioral: Negative for confusion.    Physical Exam Updated Vital Signs BP 117/62 (BP Location: Right Arm)   Pulse 74   Resp 18   Ht 1.626 m (5\' 4" )   Wt 99.8 kg   SpO2 91%   BMI 37.76 kg/m   Physical Exam Vitals and nursing note reviewed.  Constitutional:      Appearance: Normal  appearance. She is well-developed.  HENT:     Head: Atraumatic.     Nose: Nose normal.     Mouth/Throat:     Mouth: Mucous membranes are moist.  Eyes:     General: No scleral icterus.    Conjunctiva/sclera: Conjunctivae normal.  Neck:     Trachea: No tracheal deviation.     Comments: No stiffness or rigidity.  Cardiovascular:     Rate and Rhythm: Normal rate and regular rhythm.     Pulses: Normal pulses.     Heart sounds: Normal heart sounds. No murmur heard.  No friction rub. No gallop.   Pulmonary:     Effort:  Pulmonary effort is normal. No respiratory distress.     Breath sounds: Normal breath sounds.  Abdominal:     General: Bowel sounds are normal. There is no distension.     Palpations: Abdomen is soft.     Tenderness: There is no abdominal tenderness. There is no guarding.  Genitourinary:    Comments: No cva tenderness.  Musculoskeletal:     Cervical back: Normal range of motion and neck supple. No rigidity. No muscular tenderness.     Comments: Symmetric bilateral leg swelling, chronic. No calf pain or tenderness. No asymmetric swelling.   Skin:    General: Skin is warm and dry.     Findings: No rash.  Neurological:     Mental Status: She is alert.     Comments: Alert, speech normal.   Psychiatric:        Mood and Affect: Mood normal.      ED Results / Procedures / Treatments   Labs (all labs ordered are listed, but only abnormal results are displayed) Results for orders placed or performed during the hospital encounter of 08/12/20  SARS Coronavirus 2 by RT PCR (hospital order, performed in [facility] hospital lab) Nasopharyngeal Nasopharyngeal Swab   Specimen: Nasopharyngeal Swab  Result Value Ref Range   SARS Coronavirus 2 POSITIVE (A) NEGATIVE  Lipase, blood  Result Value Ref Range   Lipase 27 11 - 51 U/L  Comprehensive metabolic panel  Result Value Ref Range   Sodium 139 135 - 145 mmol/L   Potassium 3.5 3.5 - 5.1 mmol/L   Chloride 106 98 - 111  mmol/L   CO2 21 (L) 22 - 32 mmol/L   Glucose, Bld 115 (H) 70 - 99 mg/dL   BUN 7 (L) 8 - 23 mg/dL   Creatinine, Ser 0.75 0.44 - 1.00 mg/dL   Calcium 8.3 (L) 8.9 - 10.3 mg/dL   Total Protein 6.7 6.5 - 8.1 g/dL   Albumin 2.6 (L) 3.5 - 5.0 g/dL   AST 128 (H) 15 - 41 U/L   ALT 41 0 - 44 U/L   Alkaline Phosphatase 313 (H) 38 - 126 U/L   Total Bilirubin 1.7 (H) 0.3 - 1.2 mg/dL   GFR calc non Af Amer >60 >60 mL/min   GFR calc Af Amer >60 >60 mL/min   Anion gap 12 5 - 15  CBC  Result Value Ref Range   WBC 5.1 4.0 - 10.5 K/uL   RBC 4.19 3.87 - 5.11 MIL/uL   Hemoglobin 13.4 12.0 - 15.0 g/dL   HCT 39.7 36 - 46 %   MCV 94.7 80.0 - 100.0 fL   MCH 32.0 26.0 - 34.0 pg   MCHC 33.8 30.0 - 36.0 g/dL   RDW 15.2 11.5 - 15.5 %   Platelets 188 150 - 400 K/uL   nRBC 0.0 0.0 - 0.2 %   DG Chest Port 1 View  Result Date: 08/12/2020 CLINICAL DATA:  Shortness of breath and cough for several weeks EXAM: PORTABLE CHEST 1 VIEW COMPARISON:  12/25/2010 FINDINGS: Cardiac shadow is at the upper limits of normal in size. Aortic calcifications are seen. Diffuse bilateral airspace opacities are noted consistent with multifocal pneumonia. No acute bony abnormality is seen. No sizable effusion is noted. TIPS shunt is noted in the right upper quadrant. IMPRESSION: Patchy multifocal pneumonia.  Correlate with COVID-19 testing. Electronically Signed   By: Inez Catalina M.D.   On: 08/12/2020 19:19    EKG EKG Interpretation  Date/Time:  Monday  August 12 2020 23:07:23 EDT Ventricular Rate:  76 PR Interval:  158 QRS Duration: 114 QT Interval:  418 QTC Calculation: 470 R Axis:   34 Text Interpretation: Sinus rhythm `no acute st/t changes Confirmed by Lajean Saver [phone]) on 08/12/2020 11:21:04 PM   Radiology DG Chest Port 1 View  Result Date: 08/12/2020 CLINICAL DATA:  Shortness of breath and cough for several weeks EXAM: PORTABLE CHEST 1 VIEW COMPARISON:  12/25/2010 FINDINGS: Cardiac shadow is at the upper limits  of normal in size. Aortic calcifications are seen. Diffuse bilateral airspace opacities are noted consistent with multifocal pneumonia. No acute bony abnormality is seen. No sizable effusion is noted. TIPS shunt is noted in the right upper quadrant. IMPRESSION: Patchy multifocal pneumonia.  Correlate with COVID-19 testing. Electronically Signed   By: Inez Catalina M.D.   On: 08/12/2020 19:19    Procedures Procedures (including critical care time)  Medications Ordered in ED Medications - No data to display  ED Course  I have reviewed the triage vital signs and the nursing notes.  Pertinent labs & imaging results that were available during my care of the patient were reviewed by me and considered in my medical decision making (see chart for details).    MDM Rules/Calculators/A&P                         Iv ns. Continuous pulse ox and monitor. Stat labs and imaging. Patient hypoxic on room air, o2 per [place] with improvement in sats.   Reviewed nursing notes and prior charts for additional history.   Covid swab sent.  Loda Bialas was evaluated in Emergency Department on 08/12/2020 for the symptoms described in the history of present illness. She was evaluated in the context of the global COVID-19 pandemic, which necessitated consideration that the patient might be at risk for infection with the SARS-CoV-2 virus that causes COVID-19. Institutional protocols and algorithms that pertain to the evaluation of patients at risk for COVID-19 are in a state of rapid change based on information released by regulatory bodies including the CDC and federal and state organizations. These policies and algorithms were followed during the patient's care in the ED.  MDM Number of Diagnoses or Management Options   Amount and/or Complexity of Data Reviewed Clinical lab tests: ordered and reviewed Tests in the radiology section of CPT: ordered and reviewed Tests in the medicine section of CPT: ordered and  reviewed Discussion of test results with the performing providers: yes Decide to obtain previous medical records or to obtain history from someone other than the patient: yes Obtain history from someone other than the patient: yes Review and summarize past medical records: yes Discuss the patient with other providers: yes Independent visualization of images, tracings, or specimens: yes  Risk of Complications, Morbidity, and/or Mortality Presenting problems: high Diagnostic procedures: high Management options: high  Additional covid inflammatory markers added to work up.   Labs reviewed/interpreted by me - covid test is positive. Wbc normal. Chem normal w exception hco3 sl low, ?mild volume depletion. Ns bolus.   CXR reviewed/interpreted by me - multifocal pna c/w covid pneumonia.   Hospitalists consulted for admission. Discussed pt with Dr Myna Hidalgo - will admit.  Pt to receive tx o2, iv steroids, remdesivir, etc.   CRITICAL CARE RE: acute covid infection/covid pneumonia, with acute respiratory failure with hypoxia.  Performed by: Mirna Mires Total critical care time: 40 minutes Critical care time was exclusive of separately  billable procedures and treating other patients. Critical care was necessary to treat or prevent imminent or life-threatening deterioration. Critical care was time spent personally by me on the following activities: development of treatment plan with patient and/or surrogate as well as nursing, discussions with consultants, evaluation of patient's response to treatment, examination of patient, obtaining history from patient or surrogate, ordering and performing treatments and interventions, ordering and review of laboratory studies, ordering and review of radiographic studies, pulse oximetry and re-evaluation of patient's condition.    Final Clinical Impression(s) / ED Diagnoses Final diagnoses:  None    Rx / DC Orders ED Discharge Orders    None         Lajean Saver, MD 08/12/20 2324

## 2020-08-12 NOTE — Telephone Encounter (Signed)
Agree ER given symptoms

## 2020-08-12 NOTE — H&P (Signed)
History and Physical    [name] ZOX:[id number] DOB: [birth year] DOA: 08/12/2020  PCP: Elby Beck, FNP   Patient coming from: Home   Chief Complaint: Nausea, loose stools, loss of appetite, cough, SOB, lethargy   HPI: [name] is a 69 y.o. female with medical history significant for hypertension, hypothyroidism, cancer of the cecum status post hemicolectomy, liver disease with portal hypertension attributed to chemotherapy and status post TIPS, now presenting to the emergency department for evaluation of lethargy, nausea, loss of appetite, loose stools, cough, and shortness of breath.  Patient is not vaccinated against Covid.  She developed fatigue and malaise 6 days ago, also developed nausea and loss of appetite at that time, went on to have loose stools, and dry cough.  She has become progressively lethargic, continues to have multiple loose stools daily, and has persistent dry cough.  She denies any chest pain, abdominal pain, melena, or hematochezia.  ED Course: Upon arrival to the ED, patient is found to be saturating in upper 80s on room air, tachypneic, and with blood pressure 100/49.  EKG features a sinus rhythm and chest x-ray concerning for patchy multifocal pneumonia.  Chemistry panel notable for alkaline phosphatase 313, albumin 2.6, AST 128, and total bilirubin 1.7.  CBC is unremarkable.  Covid PCR is positive.  Blood cultures were collected in the ED and patient was given a liter of normal saline and started on supplemental oxygen.  Review of Systems:  All other systems reviewed and apart from HPI, are negative.  Past Medical History:  Diagnosis Date  . Cancer ([place])    cecum  . Elevated liver function tests   . Esophageal varices ([place])   . Hypothyroidism   . Iron deficiency anemia   . Liver disease    chemotherapy complication, per pt, shunts placed to bypass liver  . Malignant neoplasm of cecum ([place])   . Portal hypertension ([place])   . Splenomegaly      Past Surgical History:  Procedure Laterality Date  . ESOPHAGEAL VARICE LIGATION    . HEMICOLECTOMY  01/08/03  . LIVER SURGERY     shunts placed after chemo complication  . SKIN FULL THICKNESS GRAFT N/A 09/12/2019   Procedure: debridement and FTSG to the nose from left upper arm;  Surgeon: Cindra Presume, MD;  Location: [facility];  Service: Plastics;  Laterality: N/A;  2 hours, please    Social History:   reports that she has never smoked. She has never used smokeless tobacco. She reports that she does not drink alcohol and does not use drugs.  Allergies  Allergen Reactions  . Contrast Media [Iodinated Diagnostic Agents] Hives    Family History  Problem Relation Age of Onset  . Arthritis Mother   . Hearing loss Mother   . Heart disease Mother   . Hypertension Mother   . Arthritis Father   . Diabetes Father   . Heart disease Father   . Breast cancer Neg Hx      Prior to Admission medications   Medication Sig Start Date End Date Taking? Authorizing Provider  Cholecalciferol (VITAMIN D3) 50 MCG (2000 UT) TABS Take 2,000 Units by mouth at bedtime.     [provider]  Cyanocobalamin (VITAMIN B-12) 5000 MCG TBDP Take 5,000 mcg by mouth at bedtime.     [provider]  ELDERBERRY PO Take 5 mLs by mouth at bedtime.     [provider]  enoxaparin (LOVENOX) 60 MG/0.6ML  injection Inject 0.5 mLs (50 mg total) into the skin daily for 21 days. 03/14/20 04/04/20  Ainsley Spinner, PA-C  Glucosamine-Chondroitin 1500-1200 MG/30ML LIQD Take 30 mLs by mouth at bedtime.     [provider]  ibuprofen (ADVIL) 400 MG tablet Take 1 tablet (400 mg total) by mouth 4 (four) times daily. 03/14/20   Nita Sells, MD  losartan (COZAAR) 100 MG tablet Take 1 tablet (100 mg total) by mouth daily. 06/21/20   Elby Beck, FNP  Multiple Vitamin (MULTIVITAMIN WITH MINERALS) TABS tablet Take 1 tablet by mouth at bedtime.    [provider]   omega-3 acid ethyl esters (LOVAZA) 1 g capsule Take 1 g by mouth at bedtime.     [provider]  oxyCODONE-acetaminophen (PERCOCET/ROXICET) 5-325 MG tablet Take 1 tablet by mouth every 4 (four) hours as needed for moderate pain. 03/14/20   Nita Sells, MD  SYNTHROID 88 MCG tablet Take 1 tablet (88 mcg total) by mouth daily before breakfast. 08/04/20   Elby Beck, FNP  TURMERIC PO Take 1 tablet by mouth at bedtime.     [provider]  UNABLE TO FIND Take 1 tablet by mouth at bedtime. Med Name: Collagen-Biotin 2018m     [provider]  VITAMIN A PO Take 1 tablet by mouth at bedtime.     [provider]  vitamin C (ASCORBIC ACID) 500 MG tablet Take 500 mg by mouth at bedtime.     [provider]  zinc gluconate 50 MG tablet Take 50 mg by mouth at bedtime.    [provider]    Physical Exam: Vitals:   08/12/20 1709 08/12/20 2120 08/12/20 2310 08/12/20 2315  BP: (!) 100/49 117/62    Pulse: 79 74 67 76  Resp: '16 18 19 ' (!) 24  SpO2: (!) 89% 91% 91% 90%  Weight:      Height:        Constitutional: NAD, calm  Eyes: PERTLA, lids and conjunctivae normal ENMT: Mucous membranes are moist. Posterior pharynx clear of any exudate or lesions.   Neck: normal, supple, no masses, no thyromegaly Respiratory: Tachypnea, speaking full sentences. No wheezing. No pallor or cyanosis.     Cardiovascular: S1 & S2 heard, regular rate and rhythm. No extremity edema.   Abdomen: No distension, no tenderness, soft. Bowel sounds active.  Musculoskeletal: no clubbing / cyanosis. No joint deformity upper and lower extremities.   Skin: no significant rashes, lesions, ulcers. Warm, dry, well-perfused. Neurologic: CN 2-12 grossly intact. Sensation intact. Strength 5/5 in all 4 limbs.  Psychiatric: Alert and oriented to person, place, and situation. Pleasant and cooperative.    Labs and Imaging on Admission: I have personally reviewed following  labs and imaging studies  CBC: Recent Labs  Lab 08/12/20 1715  WBC 5.1  HGB 13.4  HCT 39.7  MCV 94.7  PLT 1468  Basic Metabolic Panel: Recent Labs  Lab 08/12/20 1715  NA 139  K 3.5  CL 106  CO2 21*  GLUCOSE 115*  BUN 7*  CREATININE 0.75  CALCIUM 8.3*   GFR: Estimated Creatinine Clearance: 76.2 mL/min (by C-G formula based on SCr of 0.75 mg/dL). Liver Function Tests: Recent Labs  Lab 08/12/20 1715  AST 128*  ALT 41  ALKPHOS 313*  BILITOT 1.7*  PROT 6.7  ALBUMIN 2.6*   Recent Labs  Lab 08/12/20 1715  LIPASE 27   No results for input(s): AMMONIA in the last 168 hours. Coagulation Profile:  No results for input(s): INR, PROTIME in the last 168 hours. Cardiac Enzymes: No results for input(s): CKTOTAL, CKMB, CKMBINDEX, TROPONINI in the last 168 hours. BNP (last 3 results) No results for input(s): PROBNP in the last 8760 hours. HbA1C: No results for input(s): HGBA1C in the last 72 hours. CBG: No results for input(s): GLUCAP in the last 168 hours. Lipid Profile: No results for input(s): CHOL, HDL, LDLCALC, TRIG, CHOLHDL, LDLDIRECT in the last 72 hours. Thyroid Function Tests: No results for input(s): TSH, T4TOTAL, FREET4, T3FREE, THYROIDAB in the last 72 hours. Anemia Panel: No results for input(s): VITAMINB12, FOLATE, FERRITIN, TIBC, IRON, RETICCTPCT in the last 72 hours. Urine analysis:    Component Value Date/Time   COLORURINE YELLOW 12/20/2010 [place] 12/20/2010 1449   LABSPEC 1.009 12/20/2010 1449   PHURINE 6.0 12/20/2010 1449   HGBUR NEGATIVE 12/20/2010 1449   BILIRUBINUR NEGATIVE 12/20/2010 1449   KETONESUR NEGATIVE 12/20/2010 1449   PROTEINUR NEGATIVE 12/20/2010 1449   UROBILINOGEN 0.2 12/20/2010 1449   NITRITE NEGATIVE 12/20/2010 1449   LEUKOCYTESUR SMALL (A) 12/20/2010 1449   Sepsis Labs: '@LABRCNTIP' (procalcitonin:4,lacticidven:4) ) Recent Results (from the past 240 hour(s))  SARS Coronavirus 2 by RT PCR (hospital order,  performed in [facility] hospital lab) Nasopharyngeal Nasopharyngeal Swab     Status: Abnormal   Collection Time: 08/12/20  5:35 PM   Specimen: Nasopharyngeal Swab  Result Value Ref Range Status   SARS Coronavirus 2 POSITIVE (A) NEGATIVE Final    Comment: RESULT CALLED TO, READ BACK BY AND VERIFIED WITH: K MUNNETT RN 08/12/20 AT 2130 SK (NOTE) SARS-CoV-2 target nucleic acids are DETECTED  SARS-CoV-2 RNA is generally detectable in upper respiratory specimens  during the acute phase of infection.  Positive results are indicative  of the presence of the identified virus, but do not rule out bacterial infection or co-infection with other pathogens not detected by the test.  Clinical correlation with patient history and  other diagnostic information is necessary to determine patient infection status.  The expected result is negative.  Fact Sheet for Patients:   StrictlyIdeas.no   Fact Sheet for Healthcare Providers:   BankingDealers.co.za    This test is not yet approved or cleared by the [place] FDA and  has been authorized for detection and/or diagnosis of SARS-CoV-2 by FDA under an Emergency Use Authorization (EUA).  This EUA will remain in effect (meaning this test  can be used) for the duration of  the COVID-19 declaration under Section 564(b)(1) of the Act, 21 U.S.C. section 360-bbb-3(b)(1), unless the authorization is terminated or revoked sooner.  Performed at [facility], [place] [address] [place], [place] [address]      Radiological Exams on Admission: DG Chest Port 1 View  Result Date: 08/12/2020 CLINICAL DATA:  Shortness of breath and cough for several weeks EXAM: PORTABLE CHEST 1 VIEW COMPARISON:  12/25/2010 FINDINGS: Cardiac shadow is at the upper limits of normal in size. Aortic calcifications are seen. Diffuse bilateral airspace opacities are noted consistent with multifocal pneumonia. No acute bony  abnormality is seen. No sizable effusion is noted. TIPS shunt is noted in the right upper quadrant. IMPRESSION: Patchy multifocal pneumonia.  Correlate with COVID-19 testing. Electronically Signed   By: Inez Catalina M.D.   On: 08/12/2020 19:19    EKG: Independently reviewed. Sinus rhythm.   Assessment/Plan   1. COVID-19 pneumonia with acute hypoxic respiratory failure  - Presents with loss of appetite, loose stools, and progressive lethargy and  dry cough, and is found to have COVID-19 with multifocal PNA and O2 saturations in upper 80s at rest  - Blood cultures were collected in ED and supplemental O2 started  - Start Decadron, start remdesivir, continue supplemental O2 as needed, consider antibiotics if procalcitonin elevated, continue supportive care, trend markers   2. Hypotension; hx of HTN  - BP low initially, likely secondary to hypovolemia in setting of poor appetite and loose stools   - Improving with IVF in ED  - Hold antihypertensives, give additional fluid bolus if needed   3. Elevated LFTs  - Alk phos 313, AST 128, and total bili 1.7 on admission  - She has hx of liver disease with portal hypertension s/p TIPS but denies abdominal pain and has benign exam  - Likely related to the acute viral illness, will monitor    4. Hypothyroidism  - Continue Synthroid    5. Acute gastroenteritis  - Abdominal exam is benign, no leukocytosis, no blood in stool  - Likely secondary to COVID-19, will continue supportive care, monitor electrolytes    DVT prophylaxis: Lovenox  Code Status: Full  Family Communication: Husband updated by phone   Disposition Plan:  Patient is from: Home  Anticipated d/c is to: TBD Anticipated d/c date is: 08/16/20 Patient currently: Has new supplemental O2 requirement and inability to maintain adequate hydration orally  Consults called: None  Admission status: Inpatient     Vianne Bulls, MD [facility]  08/12/2020, 11:29 PM

## 2020-08-12 NOTE — Telephone Encounter (Signed)
pts husband (DPR signed) left v/m that since 08/06/20 pt has not been eating well; pts husband thinks there is a flu bug floating around at school; pt is very weak nod not keeping fluids down; pts husband is concerned could be getting dehydrated. I spoke with pts husband; pt is not having nausea and vomiting but sometimes she feels like she is going to vomit. pts watery diarrhea has been going on since 08/06/20. In last 24 hours pt has had watery diarrhea 4 - 5 times. This morning pt ate about 3/4 of egg; around 1 hr later pt had watery diarrhea. Pt is not able to keep down liquids or foods without feeling extremely nauseated; pt husband said pt has dry mouth, confusion; pt is not her usual self per husband;pt is slightly lightheaded; pt last urinated about 30' ago and urine amt was about the same and pt is not sure if urine is darker than usual, pt does not have fever today; pts husband thinks mid wk last wk pt may have had a fever. Pt is mostly tired and wants to sleep a lot more than usual. Pt had some dry coughing last wk from nasal drip. Pt said her skin hurts on and off.  pts husband said he will take pt to [facility] ED for eval; possible dehydration and covid testing. FYI to Glenda Chroman FNP who is out of office and Dr Einar Pheasant who is inoffice.

## 2020-08-12 NOTE — ED Notes (Signed)
Blood Cultures sent down.

## 2020-08-13 ENCOUNTER — Inpatient Hospital Stay (HOSPITAL_COMMUNITY): Payer: Medicare Other

## 2020-08-13 DIAGNOSIS — R7989 Other specified abnormal findings of blood chemistry: Secondary | ICD-10-CM

## 2020-08-13 DIAGNOSIS — A09 Infectious gastroenteritis and colitis, unspecified: Secondary | ICD-10-CM | POA: Insufficient documentation

## 2020-08-13 DIAGNOSIS — U071 COVID-19: Secondary | ICD-10-CM | POA: Insufficient documentation

## 2020-08-13 LAB — BRAIN NATRIURETIC PEPTIDE: B Natriuretic Peptide: 58.3 pg/mL (ref 0.0–100.0)

## 2020-08-13 LAB — D-DIMER, QUANTITATIVE: D-Dimer, Quant: 1.59 ug/mL-FEU — ABNORMAL HIGH (ref 0.00–0.50)

## 2020-08-13 LAB — URINALYSIS, ROUTINE W REFLEX MICROSCOPIC
Bilirubin Urine: NEGATIVE
Glucose, UA: NEGATIVE mg/dL
Ketones, ur: NEGATIVE mg/dL
Nitrite: NEGATIVE
Protein, ur: 100 mg/dL — AB
RBC / HPF: >50 RBC/hpf — ABNORMAL HIGH (ref 0–5)
Specific Gravity, Urine: 1.008 (ref 1.005–1.030)
WBC, UA: >50 WBC/hpf — ABNORMAL HIGH (ref 0–5)
pH: 6 (ref 5.0–8.0)

## 2020-08-13 LAB — CBC WITH DIFFERENTIAL/PLATELET
Abs Immature Granulocytes: 0.07 10*3/uL (ref 0.00–0.07)
Basophils Absolute: 0 10*3/uL (ref 0.0–0.1)
Basophils Relative: 1 %
Eosinophils Absolute: 0 10*3/uL (ref 0.0–0.5)
Eosinophils Relative: 0 %
HCT: 39.3 % (ref 36.0–46.0)
Hemoglobin: 13.2 g/dL (ref 12.0–15.0)
Immature Granulocytes: 2 %
Lymphocytes Relative: 12 %
Lymphs Abs: 0.5 10*3/uL — ABNORMAL LOW (ref 0.7–4.0)
MCH: 32.2 pg (ref 26.0–34.0)
MCHC: 33.6 g/dL (ref 30.0–36.0)
MCV: 95.9 fL (ref 80.0–100.0)
Monocytes Absolute: 0.1 10*3/uL (ref 0.1–1.0)
Monocytes Relative: 3 %
Neutro Abs: 3.2 10*3/uL (ref 1.7–7.7)
Neutrophils Relative %: 82 %
Platelets: 204 10*3/uL (ref 150–400)
RBC: 4.1 MIL/uL (ref 3.87–5.11)
RDW: 15.5 % (ref 11.5–15.5)
WBC: 4 10*3/uL (ref 4.0–10.5)
nRBC: 0 % (ref 0.0–0.2)

## 2020-08-13 LAB — FERRITIN
Ferritin: 756 ng/mL — ABNORMAL HIGH (ref 11–307)
Ferritin: 616 ng/mL — ABNORMAL HIGH (ref 11–307)

## 2020-08-13 LAB — PROTIME-INR
INR: 1.2 (ref 0.8–1.2)
Prothrombin Time: 14.6 seconds (ref 11.4–15.2)

## 2020-08-13 LAB — COMPREHENSIVE METABOLIC PANEL
ALT: 37 U/L (ref 0–44)
AST: 120 U/L — ABNORMAL HIGH (ref 15–41)
Albumin: 2.5 g/dL — ABNORMAL LOW (ref 3.5–5.0)
Alkaline Phosphatase: 292 U/L — ABNORMAL HIGH (ref 38–126)
Anion gap: 12 (ref 5–15)
BUN: 7 mg/dL — ABNORMAL LOW (ref 8–23)
CO2: 19 mmol/L — ABNORMAL LOW (ref 22–32)
Calcium: 8 mg/dL — ABNORMAL LOW (ref 8.9–10.3)
Chloride: 112 mmol/L — ABNORMAL HIGH (ref 98–111)
Creatinine, Ser: 0.72 mg/dL (ref 0.44–1.00)
GFR calc Af Amer: >60 mL/min (ref >60)
GFR calc non Af Amer: >60 mL/min (ref >60)
Glucose, Bld: 105 mg/dL — ABNORMAL HIGH (ref 70–99)
Potassium: 3.7 mmol/L (ref 3.5–5.1)
Sodium: 143 mmol/L (ref 135–145)
Total Bilirubin: 1.9 mg/dL — ABNORMAL HIGH (ref 0.3–1.2)
Total Protein: 6.4 g/dL — ABNORMAL LOW (ref 6.5–8.1)

## 2020-08-13 LAB — CORTISOL: Cortisol, Plasma: 9.7 ug/dL

## 2020-08-13 LAB — TSH: TSH: 7.826 u[IU]/mL — ABNORMAL HIGH (ref 0.350–4.500)

## 2020-08-13 LAB — PROCALCITONIN: Procalcitonin: 0.21 ng/mL

## 2020-08-13 LAB — MAGNESIUM: Magnesium: 1.9 mg/dL (ref 1.7–2.4)

## 2020-08-13 LAB — C-REACTIVE PROTEIN
CRP: 15.6 mg/dL — ABNORMAL HIGH (ref <1.0)
CRP: 13.9 mg/dL — ABNORMAL HIGH (ref <1.0)

## 2020-08-13 MED ORDER — LACTATED RINGERS IV SOLN: INTRAVENOUS | Status: DC

## 2020-08-13 MED ORDER — METHYLPREDNISOLONE SODIUM SUCC 125 MG IJ SOLR
60.0000 mg | Freq: Two times a day (BID) | INTRAMUSCULAR | Status: DC
  Administered 2020-08-13 – 2020-08-17 (×9): 60 mg via INTRAVENOUS
  Filled 2020-08-13 (×9): qty 2

## 2020-08-13 MED ORDER — HYDRALAZINE HCL 20 MG/ML IJ SOLN: 10.0000 mg | Freq: Four times a day (QID) | INTRAMUSCULAR | Status: DC | PRN

## 2020-08-13 MED ORDER — ENOXAPARIN SODIUM 60 MG/0.6ML ~~LOC~~ SOLN
50.0000 mg | SUBCUTANEOUS | Status: DC
  Administered 2020-08-13 – 2020-08-17 (×5): 50 mg via SUBCUTANEOUS
  Filled 2020-08-13: qty 0.6
  Filled 2020-08-13: qty 0.5
  Filled 2020-08-13 (×3): qty 0.6

## 2020-08-13 MED ORDER — SODIUM CHLORIDE 0.9 % IV SOLN
1.0000 g | INTRAVENOUS | Status: AC
  Administered 2020-08-13 – 2020-08-17 (×5): 1 g via INTRAVENOUS
  Filled 2020-08-13 (×5): qty 10

## 2020-08-13 NOTE — ED Notes (Signed)
Patient transported to X-ray 

## 2020-08-13 NOTE — ED Notes (Signed)
Pt's oxygen saturation dropped to 86-87% on 5L [place]. Pt encouraged to take deep breaths through her nose and out her mouth and oxygen increased to 6L [place]. Pt still only 87-88%. Pt placed on HFNC at 8L. Pt's oxygen saturation is now 93%. This RN will continue to monitor.

## 2020-08-13 NOTE — ED Notes (Signed)
Dinner tray delivered.

## 2020-08-13 NOTE — Progress Notes (Addendum)
Lower extremity venous bilateral study completed.   See CV Proc for preliminary results report.   Aniyla Harling, RDMS  

## 2020-08-13 NOTE — Progress Notes (Addendum)
Pt admitted to [facility]. A/O x4. Pt arrived on [place]. Pt placed on 8L HFNC.

## 2020-08-13 NOTE — Progress Notes (Signed)
PROGRESS NOTE                                                                                                                                                                                                             Patient Demographics:    [name], is a 69 y.o. female, DOB - 1951-10-29, AYO:[id number]  Outpatient Primary MD for the patient is Elby Beck, FNP    LOS - 1  Admit date - 08/12/2020    Chief Complaint  Patient presents with  . Nausea  . Shortness of Breath       Brief Narrative -  [name] is a 69 y.o. female with medical history significant for hypertension, hypothyroidism, cancer of the cecum status post hemicolectomy, liver disease with portal hypertension attributed to chemotherapy and status post TIPS, now presenting to the emergency department for evaluation of lethargy, nausea, loss of appetite, loose stools, cough, and shortness of breath, she was diagnosed with acute hypoxic respite failure due to COVID-19 pneumonia.  Unfortunately patient is not vaccinated.   Subjective:    [name] today has, No headache, No chest pain, No abdominal pain - No Nausea, No new weakness tingling or numbness, mild SOB.   Assessment  & Plan :      1. Acute Hypoxic Resp. Failure due to Acute Covid 19 Viral Pneumonitis during the ongoing 2020 Covid 19 Pandemic - she is unfortunately not vaccinated and has so far incurred moderate lung parenchymal lung injury due to COVID-19 pneumonia and inflammation, currently on 4 to 5 L nasal cannula oxygen appears to be in no distress, continue IV steroids at high dose along with remdesivir.  She and her husband have consented for Baricitinib use if there is any clinical decline.  Note she has history of liver problems but no hepatitis this was chemotherapy related issue which has since improved.  Encouraged the patient to sit up in chair in the daytime use I-S and  flutter valve for pulmonary toiletry and then prone in bed when at night.  Will advance activity and titrate down oxygen as possible.  Actemra/Baricitinib  off label use - patient was told that if COVID-19 pneumonitis gets worse we might potentially use Actemra off label, patient denies any known history of active diverticulitis, tuberculosis or hepatitis, understands the risks and benefits and  wants to proceed with Actemra treatment if required.    SpO2: 95 % O2 Flow Rate (L/min): 4 L/min  Recent Labs  Lab 08/12/20 1715 08/12/20 1735 08/12/20 2302 08/13/20 0445  WBC 5.1  --   --  4.0  PLT 188  --   --  204  CRP  --   --  15.6* 13.9*  BNP  --   --   --  58.3  DDIMER  --   --   --  1.59*  PROCALCITON  --   --  0.21  --   AST 128*  --   --  120*  ALT 41  --   --  37  ALKPHOS 313*  --   --  292*  BILITOT 1.7*  --   --  1.9*  ALBUMIN 2.6*  --   --  2.5*  INR  --   --   --  1.2  SARSCOV2NAA  --  POSITIVE*  --   --        2.  UTI.  Placed on Rocephin follow cultures.   3.  Mildly elevated D-dimer likely due to inflammation from COVID-19, continue present weight-based Lovenox dose and check leg ultrasound.  4.  Viral gastroenteritis.  Resolved.  5.  Hypothyroidism.  Continue home dose Synthroid, TSH mildly elevated could be due to sick euthyroid, repeat TSH in 4 to 6 weeks by PCP once she is clinically better.  6.  Hypertension.  Currently blood pressure low due to dehydration, hydrate with IV fluids with as needed IV hydralazine as needed.  7.  Acute on chronic elevation of AST.  Likely due to acute viral illness at remdesivir use, total bilirubin stable, INR stable.  Will monitor.  Has history of chemo related liver injury in the past but no history of hepatitis.     Condition - Extremely Guarded  Family Communication  : Husband Ozzie Hoyle [phone] on 08/13/2020  Code Status :  Full  Consults  :  None  Procedures  :  None  PUD Prophylaxis : None  Disposition Plan  :     Status is: Inpatient  Remains inpatient appropriate because:IV treatments appropriate due to intensity of illness or inability to take PO   Dispo: The patient is from: Home              Anticipated d/c is to: Home              Anticipated d/c date is: > 3 days              Patient currently is not medically stable to d/c.   DVT Prophylaxis  :  Lovenox    Lab Results  Component Value Date   PLT 204 08/13/2020    Diet :  Diet Order            Diet Heart Room service appropriate? Yes; Fluid consistency: Thin  Diet effective now                  Inpatient Medications  Scheduled Meds: . enoxaparin (LOVENOX) injection  50 mg Subcutaneous Q24H  . levothyroxine  88 mcg Oral Q0600  . methylPREDNISolone (SOLU-MEDROL) injection  60 mg Intravenous Q12H   Continuous Infusions: . cefTRIAXone (ROCEPHIN)  IV Stopped (08/13/20 0901)  . [START ON 08/14/2020] remdesivir 100 mg in NS 100 mL     PRN Meds:.acetaminophen, guaiFENesin-dextromethorphan, [DISCONTINUED] ondansetron **OR** ondansetron (ZOFRAN) IV  Antibiotics  :    Anti-infectives (  From admission, onward)   Start     Dose/Rate Route Frequency Ordered Stop   08/14/20 1000  remdesivir 100 mg in sodium chloride 0.9 % 100 mL IVPB       "Followed by" Linked Group Details   100 mg 200 mL/hr over 30 Minutes Intravenous Daily 08/12/20 2328 08/18/20 0959   08/13/20 0800  cefTRIAXone (ROCEPHIN) 1 g in sodium chloride 0.9 % 100 mL IVPB        1 g 200 mL/hr over 30 Minutes Intravenous Every 24 hours 08/13/20 0708 08/16/20 0759   08/13/20 0030  remdesivir 200 mg in sodium chloride 0.9% 250 mL IVPB       "Followed by" Linked Group Details   200 mg 580 mL/hr over 30 Minutes Intravenous Once 08/12/20 2328 08/13/20 0207       Time Spent in minutes  30   Lala Lund M.D on 08/13/2020 at 9:57 AM  To page go to [email or web address] - password [facility]  [facility] -  Office  [phone]    See all Orders from today for  further details    Objective:   Vitals:   08/13/20 0600 08/13/20 0830 08/13/20 0900 08/13/20 0930  BP: (!) 141/71 140/75 135/70 125/70  Pulse: 79 79 79 76  Resp: 20 (!) 22 (!) 22 20  Temp:      TempSrc:      SpO2: 92% 91% 90% 95%  Weight:      Height:        Wt Readings from Last 3 Encounters:  08/12/20 99.8 kg  03/11/20 113.4 kg  03/06/20 113.6 kg     Intake/Output Summary (Last 24 hours) at 08/13/2020 0957 Last data filed at 08/13/2020 0901 Gross per 24 hour  Intake 100 ml  Output --  Net 100 ml     Physical Exam  Awake Alert, No new F.N deficits, Normal affect [place].AT,PERRAL Supple Neck,No JVD, No cervical lymphadenopathy appriciated.  Symmetrical Chest wall movement, Good air movement bilaterally, CTAB RRR,No Gallops,Rubs or new Murmurs, No Parasternal Heave +ve B.Sounds, Abd Soft, No tenderness, No organomegaly appriciated, No rebound - guarding or rigidity. No Cyanosis, Clubbing or edema, No new Rash or bruise      Data Review:    CBC Recent Labs  Lab 08/12/20 1715 08/13/20 0445  WBC 5.1 4.0  HGB 13.4 13.2  HCT 39.7 39.3  PLT 188 204  MCV 94.7 95.9  MCH 32.0 32.2  MCHC 33.8 33.6  RDW 15.2 15.5  LYMPHSABS  --  0.5*  MONOABS  --  0.1  EOSABS  --  0.0  BASOSABS  --  0.0    Recent Labs  Lab 08/12/20 1715 08/12/20 2302 08/13/20 0445  NA 139  --  143  K 3.5  --  3.7  CL 106  --  112*  CO2 21*  --  19*  GLUCOSE 115*  --  105*  BUN 7*  --  7*  CREATININE 0.75  --  0.72  CALCIUM 8.3*  --  8.0*  AST 128*  --  120*  ALT 41  --  37  ALKPHOS 313*  --  292*  BILITOT 1.7*  --  1.9*  ALBUMIN 2.6*  --  2.5*  MG  --   --  1.9  CRP  --  15.6* 13.9*  DDIMER  --   --  1.59*  PROCALCITON  --  0.21  --   INR  --   --  1.2  TSH  --   --  7.826*  BNP  --   --  58.3    ------------------------------------------------------------------------------------------------------------------ Recent Labs    08/12/20 2302  TRIG 83    No results found  for: HGBA1C ------------------------------------------------------------------------------------------------------------------ Recent Labs    08/13/20 0445  TSH 7.826*    Cardiac Enzymes No results for input(s): CKMB, TROPONINI, MYOGLOBIN in the last 168 hours.  Invalid input(s): CK ------------------------------------------------------------------------------------------------------------------    Component Value Date/Time   BNP 58.3 08/13/2020 0445    Micro Results Recent Results (from the past 240 hour(s))  SARS Coronavirus 2 by RT PCR (hospital order, performed in [facility] hospital lab) Nasopharyngeal Nasopharyngeal Swab     Status: Abnormal   Collection Time: 08/12/20  5:35 PM   Specimen: Nasopharyngeal Swab  Result Value Ref Range Status   SARS Coronavirus 2 POSITIVE (A) NEGATIVE Final    Comment: RESULT CALLED TO, READ BACK BY AND VERIFIED WITH: K MUNNETT RN 08/12/20 AT 2130 SK (NOTE) SARS-CoV-2 target nucleic acids are DETECTED  SARS-CoV-2 RNA is generally detectable in upper respiratory specimens  during the acute phase of infection.  Positive results are indicative  of the presence of the identified virus, but do not rule out bacterial infection or co-infection with other pathogens not detected by the test.  Clinical correlation with patient history and  other diagnostic information is necessary to determine patient infection status.  The expected result is negative.  Fact Sheet for Patients:   StrictlyIdeas.no   Fact Sheet for Healthcare Providers:   BankingDealers.co.za    This test is not yet approved or cleared by the [place] FDA and  has been authorized for detection and/or diagnosis of SARS-CoV-2 by FDA under an Emergency Use Authorization (EUA).  This EUA will remain in effect (meaning this test  can be used) for the duration of  the COVID-19 declaration under Section 564(b)(1) of the Act,  21 U.S.C. section 360-bbb-3(b)(1), unless the authorization is terminated or revoked sooner.  Performed at [facility], [place] [address], [place], [place] [address]     Radiology Reports DG Chest [place] 1 View  Result Date: 08/12/2020 CLINICAL DATA:  Shortness of breath and cough for several weeks EXAM: PORTABLE CHEST 1 VIEW COMPARISON:  12/25/2010 FINDINGS: Cardiac shadow is at the upper limits of normal in size. Aortic calcifications are seen. Diffuse bilateral airspace opacities are noted consistent with multifocal pneumonia. No acute bony abnormality is seen. No sizable effusion is noted. TIPS shunt is noted in the right upper quadrant. IMPRESSION: Patchy multifocal pneumonia.  Correlate with COVID-19 testing. Electronically Signed   By: Inez Catalina M.D.   On: 08/12/2020 19:19

## 2020-08-13 NOTE — Telephone Encounter (Signed)
Per EMR, patient admitted to hospital with Covid pneumonia.

## 2020-08-13 NOTE — ED Notes (Addendum)
Pt reports being extremely hot and this rn provided ice packs however attempted to get oral and axillary temp but was unsuccessful and obtain an rectal temp of 95.1 F, will notify MD, also increase pt O2 to 4L as pt O2 sats were 88%, sats improve to 90%   0134 place bair hugger on pt and will recheck temp

## 2020-08-14 LAB — COMPREHENSIVE METABOLIC PANEL
ALT: 36 U/L (ref 0–44)
AST: 95 U/L — ABNORMAL HIGH (ref 15–41)
Albumin: 2.3 g/dL — ABNORMAL LOW (ref 3.5–5.0)
Alkaline Phosphatase: 252 U/L — ABNORMAL HIGH (ref 38–126)
Anion gap: 13 (ref 5–15)
BUN: 13 mg/dL (ref 8–23)
CO2: 20 mmol/L — ABNORMAL LOW (ref 22–32)
Calcium: 7.9 mg/dL — ABNORMAL LOW (ref 8.9–10.3)
Chloride: 107 mmol/L (ref 98–111)
Creatinine, Ser: 0.77 mg/dL (ref 0.44–1.00)
GFR calc Af Amer: >60 mL/min (ref >60)
GFR calc non Af Amer: >60 mL/min (ref >60)
Glucose, Bld: 158 mg/dL — ABNORMAL HIGH (ref 70–99)
Potassium: 3.3 mmol/L — ABNORMAL LOW (ref 3.5–5.1)
Sodium: 140 mmol/L (ref 135–145)
Total Bilirubin: 1.4 mg/dL — ABNORMAL HIGH (ref 0.3–1.2)
Total Protein: 6.3 g/dL — ABNORMAL LOW (ref 6.5–8.1)

## 2020-08-14 LAB — CBC WITH DIFFERENTIAL/PLATELET
Abs Immature Granulocytes: 0.07 10*3/uL (ref 0.00–0.07)
Basophils Absolute: 0 10*3/uL (ref 0.0–0.1)
Basophils Relative: 0 %
Eosinophils Absolute: 0 10*3/uL (ref 0.0–0.5)
Eosinophils Relative: 0 %
HCT: 38.4 % (ref 36.0–46.0)
Hemoglobin: 12.9 g/dL (ref 12.0–15.0)
Immature Granulocytes: 1 %
Lymphocytes Relative: 9 %
Lymphs Abs: 0.5 10*3/uL — ABNORMAL LOW (ref 0.7–4.0)
MCH: 31.4 pg (ref 26.0–34.0)
MCHC: 33.6 g/dL (ref 30.0–36.0)
MCV: 93.4 fL (ref 80.0–100.0)
Monocytes Absolute: 0.2 10*3/uL (ref 0.1–1.0)
Monocytes Relative: 3 %
Neutro Abs: 5.5 10*3/uL (ref 1.7–7.7)
Neutrophils Relative %: 87 %
Platelets: 271 10*3/uL (ref 150–400)
RBC: 4.11 MIL/uL (ref 3.87–5.11)
RDW: 15.4 % (ref 11.5–15.5)
WBC: 6.3 10*3/uL (ref 4.0–10.5)
nRBC: 0 % (ref 0.0–0.2)

## 2020-08-14 LAB — BRAIN NATRIURETIC PEPTIDE: B Natriuretic Peptide: 197.5 pg/mL — ABNORMAL HIGH (ref 0.0–100.0)

## 2020-08-14 LAB — GLUCOSE, CAPILLARY
Glucose-Capillary: 133 mg/dL — ABNORMAL HIGH (ref 70–99)
Glucose-Capillary: 109 mg/dL — ABNORMAL HIGH (ref 70–99)
Glucose-Capillary: 157 mg/dL — ABNORMAL HIGH (ref 70–99)

## 2020-08-14 LAB — HEMOGLOBIN A1C
Hgb A1c MFr Bld: 5.1 % (ref 4.8–5.6)
Mean Plasma Glucose: 99.67 mg/dL

## 2020-08-14 LAB — D-DIMER, QUANTITATIVE: D-Dimer, Quant: 1.36 ug/mL-FEU — ABNORMAL HIGH (ref 0.00–0.50)

## 2020-08-14 LAB — C-REACTIVE PROTEIN: CRP: 8.6 mg/dL — ABNORMAL HIGH (ref <1.0)

## 2020-08-14 MED ORDER — BARICITINIB 2 MG PO TABS
4.0000 mg | ORAL_TABLET | Freq: Every day | ORAL | Status: DC
  Administered 2020-08-14 – 2020-08-17 (×4): 4 mg via ORAL
  Filled 2020-08-14 (×4): qty 2

## 2020-08-14 MED ORDER — POTASSIUM CHLORIDE CRYS ER 20 MEQ PO TBCR
40.0000 meq | EXTENDED_RELEASE_TABLET | Freq: Once | ORAL | Status: AC
  Administered 2020-08-14: 40 meq via ORAL
  Filled 2020-08-14: qty 2

## 2020-08-14 MED ORDER — INSULIN ASPART 100 UNIT/ML ~~LOC~~ SOLN: 0.0000 [IU] | Freq: Every day | SUBCUTANEOUS | Status: DC

## 2020-08-14 MED ORDER — INSULIN ASPART 100 UNIT/ML ~~LOC~~ SOLN
0.0000 [IU] | Freq: Three times a day (TID) | SUBCUTANEOUS | Status: DC
  Administered 2020-08-14 – 2020-08-15 (×3): 1 [IU] via SUBCUTANEOUS
  Administered 2020-08-16 – 2020-08-17 (×2): 2 [IU] via SUBCUTANEOUS
  Administered 2020-08-17: 1 [IU] via SUBCUTANEOUS

## 2020-08-14 NOTE — Progress Notes (Signed)
PROGRESS NOTE    [name]  HGD:[id number] DOB: 1951/08/01 DOA: 08/12/2020 PCP: Elby Beck, FNP   Chief Complaint  Patient presents with  . Nausea  . 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  Unfortunately patient is not vaccinated.  Assessment & Plan:   Principal Problem:   Acute hypoxemic respiratory failure due to COVID-19 [facility]) Active Problems:   Hypothyroidism   LFT elevation   Essential hypertension  Acute Hypoxic Respiratory Failure 2/2 COVID 19 Pneumonia:  Unvaccinated  CXR with patchy multifocal pneumonia Currently requiring 8 L [place] Discussed risk/benefits/off label use of baricitinib/actemra.  Will plan to start baricitinib today (discussed risk of infection/malignancy/vte).  Denies hx TB or hepatitis.   Procal 0.21, not clearly suggestive of secondary bacterial infection - she's on ceftriaxone for possible UTI (though asx). Strict I/O, daily weights Prone as able, OOB, IS, flutter  COVID-19 Labs  Recent Labs    08/12/20 2302 08/13/20 0445 08/14/20 0745  DDIMER  --  1.59* 1.36*  FERRITIN 756* 616*  --   LDH 494*  --   --   CRP 15.6* 13.9* 8.6*    Lab Results  Component Value Date   SARSCOV2NAA POSITIVE ([place] [place]) 08/12/2020   [place] NEGATIVE 03/12/2020   SARSCOV2NAA NOT DETECTED 09/08/2019   Asymptomatic Bacteruria: no urine cx collected, pt on ceftriaxone.  Will try to add on culture if possible.  Continue ceftriaxone for 5 days as pt will receive baricitinib.   Elevated D Dimer: negative  LE [place], follow  Viral Gastroenteritis: resolved  Hypothyroidisim: continue synthroid, TSH mildly elevated - repeat outpatient TSH  Hypertension: home BP meds on hold  Hx Cancer of Cecum s/p hemicolectomy  Hx Liver disease with portal hypertension 2/2 chemotherapy  S/p TIPS: elevated AST and alk phos.  Mildly elevated Bili.  Continue to monitor.  Hyperglycemia: SSI, follow A1c  Hypokalemia: replace and follow   DVT prophylaxis:lovenox Code Status: full  Family Communication: none at bedside Disposition:   Status is: Inpatient  Remains inpatient appropriate because:Inpatient level of care appropriate due to severity of illness   Dispo: The patient is from: Home              Anticipated d/c is to: Home              Anticipated d/c date is: > 3 days              Patient currently is not medically stable to d/c.   Consultants:   none  Procedures LE [place] Summary:  RIGHT:  - There is no evidence of deep vein thrombosis in the lower extremity.    - No cystic structure found in the popliteal fossa.    LEFT:  - There is no evidence of deep vein thrombosis in the lower extremity.    - No cystic structure found in the popliteal fossa.   Antimicrobials:  Anti-infectives (From admission, onward)   Start     Dose/Rate Route Frequency Ordered Stop   08/14/20 1000  remdesivir 100 mg in sodium chloride 0.9 % 100 mL IVPB       "  Followed by" Linked Group Details   100 mg 200 mL/hr over 30 Minutes Intravenous Daily 08/12/20 2328 08/18/20 0959   08/13/20 0800  cefTRIAXone (ROCEPHIN) 1 g in sodium chloride 0.9 % 100 mL IVPB        1 g 200 mL/hr over 30 Minutes Intravenous Every 24 hours 08/13/20 0708 08/18/20 0759   08/13/20 0030  remdesivir 200 mg in sodium chloride 0.9% 250 mL IVPB       "Followed by" Linked Group Details   200 mg 580 mL/hr over 30 Minutes Intravenous Once 08/12/20 2328 08/13/20 0207      Subjective: No new complaints today  Objective: Vitals:    08/13/20 2230 08/14/20 0000 08/14/20 0200 08/14/20 0400  BP: (!) 145/77 135/81 129/62 119/63  Pulse: 91 82 79 67  Resp: (!) 24 20 (!) 22 18  Temp:  (!) 94.6 F (34.8 C) (!) 97.5 F (36.4 C) (!) 97.5 F (36.4 C)  TempSrc:  Rectal Oral Axillary  SpO2: (!) 88% 93% 96% 95%  Weight:      Height:        Intake/Output Summary (Last 24 hours) at 08/14/2020 1100 Last data filed at 08/14/2020 0400 Gross per 24 hour  Intake 300 ml  Output --  Net 300 ml   Filed Weights   08/12/20 1708  Weight: 99.8 kg    Examination:  General exam: Appears calm and comfortable  Respiratory system: Clear to auscultation. Respiratory effort normal. Cardiovascular system: S1 & S2 heard, RRR. Gastrointestinal system: Abdomen is nondistended, soft and nontender. Central nervous system: Alert and oriented. No focal neurological deficits. Extremities: no LEE Skin: No rashes, lesions or ulcers Psychiatry: Judgement and insight appear normal. Mood & affect appropriate.     Data Reviewed: I have personally reviewed following labs and imaging studies  CBC: Recent Labs  Lab 08/12/20 1715 08/13/20 0445 08/14/20 0745  WBC 5.1 4.0 6.3  NEUTROABS  --  3.2 5.5  HGB 13.4 13.2 12.9  HCT 39.7 39.3 38.4  MCV 94.7 95.9 93.4  PLT 188 204 240    Basic Metabolic Panel: Recent Labs  Lab 08/12/20 1715 08/13/20 0445 08/14/20 0745  NA 139 143 140  K 3.5 3.7 3.3*  CL 106 112* 107  CO2 21* 19* 20*  GLUCOSE 115* 105* 158*  BUN 7* 7* 13  CREATININE 0.75 0.72 0.77  CALCIUM 8.3* 8.0* 7.9*  MG  --  1.9  --     GFR: Estimated Creatinine Clearance: 76.2 mL/min (by C-G formula based on SCr of 0.77 mg/dL).  Liver Function Tests: Recent Labs  Lab 08/12/20 1715 08/13/20 0445 08/14/20 0745  AST 128* 120* 95*  ALT 41 37 36  ALKPHOS 313* 292* 252*  BILITOT 1.7* 1.9* 1.4*  PROT 6.7 6.4* 6.3*  ALBUMIN 2.6* 2.5* 2.3*    CBG: No results for input(s): GLUCAP in the last 168 hours.   Recent Results (from  the past 240 hour(s))  SARS Coronavirus 2 by RT PCR (hospital order, performed in [facility] hospital lab) Nasopharyngeal Nasopharyngeal Swab     Status: Abnormal   Collection Time: 08/12/20  5:35 PM   Specimen: Nasopharyngeal Swab  Result Value Ref Range Status   SARS Coronavirus 2 POSITIVE ([place]) NEGATIVE Final    Comment: RESULT CALLED TO, READ BACK BY AND VERIFIED WITH: K MUNNETT RN 08/12/20 AT 2130 SK (NOTE) SARS-CoV-2 target nucleic acids are DETECTED  SARS-CoV-2 RNA is generally detectable in upper respiratory specimens  during the acute phase  of infection.  Positive results are indicative  of the presence of the identified virus, but do not rule out bacterial infection or co-infection with other pathogens not detected by the test.  Clinical correlation with patient history and  other diagnostic information is necessary to determine patient infection status.  The expected result is negative.  Fact Sheet for Patients:   StrictlyIdeas.no   Fact Sheet for Healthcare Providers:   BankingDealers.co.za    This test is not yet approved or cleared by the [place] FDA and  has been authorized for detection and/or diagnosis of SARS-CoV-2 by FDA under an Emergency Use Authorization (EUA).  This EUA will remain in effect (meaning this test  can be used) for the duration of  the COVID-19 declaration under Section 564(b)(1) of the Act, 21 U.S.C. section 360-bbb-3(b)(1), unless the authorization is terminated or revoked sooner.  Performed at [facility], [place] [address] [place], [place] [address]   Culture, blood (routine x 2)     Status: None (Preliminary result)   Collection Time: 08/12/20 11:02 PM   Specimen: BLOOD  Result Value Ref Range Status   Specimen Description BLOOD RIGHT WRIST  Final   Special Requests   Final    BOTTLES DRAWN AEROBIC AND ANAEROBIC Blood Culture adequate volume   Culture   Final    NO GROWTH < 12  HOURS Performed at [facility], [place] [address], [place], [place] [address]    Report Status PENDING  Incomplete  Culture, blood (routine x 2)     Status: None (Preliminary result)   Collection Time: 08/13/20  1:30 AM   Specimen: BLOOD  Result Value Ref Range Status   Specimen Description BLOOD LEFT ANTECUBITAL  Final   Special Requests   Final    BOTTLES DRAWN AEROBIC AND ANAEROBIC Blood Culture results may not be optimal due to an excessive volume of blood received in culture bottles   Culture   Final    NO GROWTH < 12 HOURS Performed at [facility], [place] [address] [place],  [address]    Report Status PENDING  Incomplete         Radiology Studies: DG Chest Port 1 View  Result Date: 08/12/2020 CLINICAL DATA:  Shortness of breath and cough for several weeks EXAM: PORTABLE CHEST 1 VIEW COMPARISON:  12/25/2010 FINDINGS: Cardiac shadow is at the upper limits of normal in size. Aortic calcifications are seen. Diffuse bilateral airspace opacities are noted consistent with multifocal pneumonia. No acute bony abnormality is seen. No sizable effusion is noted. TIPS shunt is noted in the right upper quadrant. IMPRESSION: Patchy multifocal pneumonia.  Correlate with COVID-19 testing. Electronically Signed   By: Inez Catalina M.D.   On: 08/12/2020 19:19   VAS [place] LOWER EXTREMITY VENOUS (DVT)  Result Date: 08/13/2020  Lower Venous DVTStudy Indications: Elevated d-dimer.  Comparison Study: No prior studies. Performing Technologist: Darlin Coco  Examination Guidelines: Roberth Berling complete evaluation includes B-mode imaging, spectral Doppler, color Doppler, and power Doppler as needed of all accessible portions of each vessel. Bilateral testing is considered an integral part of Aras Albarran complete examination. Limited examinations for reoccurring indications may be performed as noted. The reflux portion of the exam is performed with the patient in reverse Trendelenburg.   +---------+---------------+---------+-----------+----------+--------------+ RIGHT    CompressibilityPhasicitySpontaneityPropertiesThrombus Aging +---------+---------------+---------+-----------+----------+--------------+ CFV      Full           Yes      Yes                                 +---------+---------------+---------+-----------+----------+--------------+  SFJ      Full                                                        +---------+---------------+---------+-----------+----------+--------------+ FV Prox  Full                                                        +---------+---------------+---------+-----------+----------+--------------+ FV Mid   Full                                                        +---------+---------------+---------+-----------+----------+--------------+ FV DistalFull                                                        +---------+---------------+---------+-----------+----------+--------------+ PFV      Full                                                        +---------+---------------+---------+-----------+----------+--------------+ POP      Full           Yes      Yes                                 +---------+---------------+---------+-----------+----------+--------------+ PTV      Full                                                        +---------+---------------+---------+-----------+----------+--------------+ PERO     Full                                                        +---------+---------------+---------+-----------+----------+--------------+   +---------+---------------+---------+-----------+----------+--------------+ LEFT     CompressibilityPhasicitySpontaneityPropertiesThrombus Aging +---------+---------------+---------+-----------+----------+--------------+ CFV      Full           Yes      Yes                                  +---------+---------------+---------+-----------+----------+--------------+ SFJ      Full                                                        +---------+---------------+---------+-----------+----------+--------------+  FV Prox  Full                                                        +---------+---------------+---------+-----------+----------+--------------+ FV Mid   Full                                                        +---------+---------------+---------+-----------+----------+--------------+ FV DistalFull                                                        +---------+---------------+---------+-----------+----------+--------------+ PFV      Full                                                        +---------+---------------+---------+-----------+----------+--------------+ POP      Full           Yes      Yes                                 +---------+---------------+---------+-----------+----------+--------------+ PTV      Full                                                        +---------+---------------+---------+-----------+----------+--------------+ PERO     Full                                                        +---------+---------------+---------+-----------+----------+--------------+     Summary: RIGHT: - There is no evidence of deep vein thrombosis in the lower extremity.  - No cystic structure found in the popliteal fossa.  LEFT: - There is no evidence of deep vein thrombosis in the lower extremity.  - No cystic structure found in the popliteal fossa.  *See table(s) above for measurements and observations. Electronically signed by Deitra Mayo MD on 08/13/2020 at 4:47:42 PM.    Final         Scheduled Meds: . enoxaparin (LOVENOX) injection  50 mg Subcutaneous Q24H  . levothyroxine  88 mcg Oral Q0600  . methylPREDNISolone (SOLU-MEDROL) injection  60 mg Intravenous Q12H  . potassium chloride  40 mEq Oral Once    Continuous Infusions: . cefTRIAXone (ROCEPHIN)  IV 1 g (08/14/20 0854)  . remdesivir 100 mg in NS 100 mL 100 mg (08/14/20 0856)     LOS: 2 days    Time spent: over 30 min    Fayrene Helper, MD [facility]   To  contact the attending provider between 7A-7P or the covering provider during after hours 7P-7A, please log into the web site [email or web address] and access using universal [place] password for that web site. If you do not have the password, please call the hospital operator.  08/14/2020, 11:00 AM

## 2020-08-14 NOTE — Progress Notes (Signed)
K+ 3.3 this AM. Ok to give KCL 16meq x1 per Dr Merla Riches, PharmD, BCIDP, AAHIVP, CPP Infectious Disease Pharmacist 08/14/2020 9:40 AM

## 2020-08-14 NOTE — Evaluation (Signed)
Physical Therapy Evaluation Patient Details Name: [name] MRN: [id number] DOB: 12-10-1950 Today's Date: 08/14/2020   History of Present Illness  Pt is a 69 y.o. 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  Unfortunately patient is not vaccinated.  Clinical Impression  Pt admitted with above diagnosis. Pt presenting with decreased mobility, strength, endurance, activity tolerance, and balance.  She ambulated 105' in room with RW and min guard on 6 LPM O2 with sats down to 86%.  Pt did have quick recovery of O2 sats.  Pt educated on PT role, POC, breathing techniques with transfers, and positioning to promote improved lung filling. Also encouraged incentive and flutter valve.  Pt has necessary DME at home.  She does have 24 hr support from spouse - however reports he is currently in ED also being tested for COVID. Pt currently with functional limitations due to the deficits listed below (see PT Problem List). Pt will benefit from skilled PT to increase their independence and safety with mobility to allow discharge to the venue listed below.       Follow Up Recommendations Outpatient PT;Supervision/Assistance - 24 hour (continue with her current outpt PT)    Equipment Recommendations  None recommended by PT    Recommendations for Other Services       Precautions / Restrictions Precautions Precautions: Fall Precaution Comments: watch O2      Mobility  Bed Mobility Overal bed mobility: Needs Assistance Bed Mobility: Sit to Supine       Sit to supine: Min guard;HOB elevated    General bed mobility comments: in chair at arrival  Transfers Overall transfer level: Needs assistance Equipment used: Rolling walker (2 wheeled) Transfers: Sit to/from Stand Sit to Stand: Min guard         General transfer comment: min guard for safety; cues for safe hand placement  Ambulation/Gait Ambulation/Gait assistance: Min guard Gait Distance (Feet): 18 Feet Assistive device: Rolling walker (2 wheeled) Gait Pattern/deviations: Step-through pattern;Decreased stride length Gait velocity: decreased   General Gait Details: min cues for RW and controlled breathing  Stairs            Wheelchair Mobility    Modified Rankin (Stroke Patients Only)       Balance Overall balance assessment: Needs assistance Sitting-balance support: No upper extremity supported;Feet supported Sitting balance-Leahy Scale: Good     Standing balance support: No upper extremity supported Standing balance-Leahy Scale: Fair Standing balance comment: Used RW for ambulation but able to static stand without AD                             Pertinent Vitals/Pain Pain Assessment: No/denies pain    Home Living Family/patient expects to be discharged to:: Private residence Living Arrangements: Spouse/significant other Available Help at Discharge: Family;Available 24 hours/day Type of Home: House Home Access: Ramped entrance     Home Layout: One level Home Equipment: Bedside commode;Hospital bed;Tub bench;Wheelchair - [employer] - 2 wheels;Cane - single point      Prior Function Level of Independence: Independent         Comments: Pt with tibial plateau  fx in April 2021 and was still getting outpt PT to work on stairs. 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  Reports did fatigue at work sometimes and has w/c just in case.     Hand Dominance        Extremity/Trunk Assessment   Upper Extremity Assessment Upper  Extremity Assessment: Defer to OT evaluation    Lower Extremity Assessment Lower Extremity Assessment: Generalized weakness    Cervical / Trunk Assessment Cervical / Trunk Assessment: Normal  Communication   Communication: No difficulties  Cognition Arousal/Alertness: Awake/alert Behavior During Therapy: WFL for tasks assessed/performed Overall Cognitive Status: Within Functional Limits for tasks assessed                                 General Comments: little HOH      General Comments General comments (skin integrity, edema, etc.): Pt on 5 LPM O2 with sats 95% rest; on 6 LPM to walk with sats down to 86%; recovered in less than 30 seconds with rest and pursed lip breathing; back on 5 LPM rest.  Educated on use of incentitive spirometer and flutter valve performed each x 10.  Also encouraged laying prone when in bed and sitting up as much as possible.    Exercises     Assessment/Plan    PT Assessment Patient needs continued PT services  PT Problem List Decreased strength;Decreased mobility;Decreased activity tolerance;Cardiopulmonary status limiting activity;Decreased balance;Decreased knowledge of use of DME       PT Treatment Interventions DME instruction;Therapeutic activities;Gait training;Therapeutic exercise;Patient/family education;Stair training;Balance training;Functional mobility training    PT Goals (Current goals can be found in the Care Plan section)  Acute Rehab PT Goals Patient Stated Goal: return home PT Goal Formulation: With patient Time For Goal Achievement: 08/28/20 Potential to Achieve Goals: Good    Frequency Min 3X/week   Barriers to discharge        Co-evaluation               AM-PAC PT "6 Clicks" Mobility  Outcome Measure Help needed turning from your back to your side while in a flat bed without using bedrails?: A Little Help needed moving from lying on your back to sitting on the side of a flat bed without using  bedrails?: A Little Help needed moving to and from a bed to a chair (including a wheelchair)?: A Little Help needed standing up from a chair using your arms (e.g., wheelchair or bedside chair)?: A Little Help needed to walk in hospital room?: A Little Help needed climbing 3-5 steps with a railing? : A Little 6 Click Score: 18    End of Session Equipment Utilized During Treatment: Gait belt Activity Tolerance: Patient tolerated treatment well Patient left: in bed;with call bell/phone within reach Nurse Communication: Mobility status PT Visit Diagnosis: Other abnormalities of gait and mobility (R26.89);Muscle weakness (generalized) (M62.81)    Time: 6283-1517 PT Time Calculation (min) (ACUTE ONLY): 32 min   Charges:   PT Evaluation $PT Eval Moderate Complexity: 1 Mod PT Treatments $Therapeutic Activity: 8-22 mins        Abran Richard, PT Acute Rehab Services Pager [phone] [facility] [phone]    Karlton Lemon 08/14/2020, 6:00 PM

## 2020-08-14 NOTE — Evaluation (Signed)
Occupational Therapy Evaluation Patient Details Name: [name] Standard MRN: [id number] DOB: 06-May-1951 Today's Date: 08/14/2020    History of Present Illness Pt is a 69 y.o. 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  Unfortunately patient is not vaccinated.   Clinical Impression   PTA pt living with family and functioning at mod I community level. She has a hx of fracturing her tibia, and has been working with OP PT to work on stairs. 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 At time of eval, pt presents with ability to complete bed mobility and sit <> stands at min guard level. Pt is currently completing BADL at supervision- min A level. Increased assist needed for LB ADLs due to weakness/DOE. Pt on 5L HFNC during sesison with VSS with increased time and rest breaks. Began education on ECS. Oriented pt to reading O2 sats and RR and how this applies to her typical ADL routines. Given current status, anticipate pt will progress well without OT follow up. OT will continue to follow per POC listed below.  Pt on 5L HFNC with SpO2 stable for activities assessed. Will continue to progress pt ADL mobility to see if increased O2 needs are needed.    Follow Up Recommendations  No OT follow up;Supervision - Intermittent    Equipment Recommendations  None recommended by OT    Recommendations for Other Services       Precautions / Restrictions Precautions Precautions: Fall Precaution Comments: watch O2 Restrictions Weight Bearing Restrictions: No      Mobility Bed Mobility Overal bed  mobility: Needs Assistance Bed Mobility: Sit to Supine       Sit to supine: Min guard   General bed mobility comments: in chair at arrival  Transfers Overall transfer level: Needs assistance Equipment used: Rolling walker (2 wheeled) Transfers: Sit to/from Stand Sit to Stand: Min guard         General transfer comment: min guard for safety; cues for safe hand placement    Balance Overall balance assessment: Needs assistance Sitting-balance support: No upper extremity supported;Feet supported Sitting balance-Leahy Scale: Good     Standing balance support: No upper extremity supported Standing balance-Leahy Scale: Fair Standing balance comment: Used RW for ambulation but able to static stand without AD                           ADL either performed or assessed with clinical judgement   ADL Overall ADL's : Needs assistance/impaired Eating/Feeding: Independent;Sitting   Grooming: Supervision/safety;Standing   Upper Body Bathing: Minimal assistance;Sitting   Lower Body Bathing: Minimal assistance;Sit to/from stand   Upper Body Dressing : Set up;Sitting   Lower Body Dressing: Minimal assistance;Sit to/from stand   Toilet Transfer: Min guard;Ambulation;Regular Toilet   Toileting- Clothing Manipulation and Hygiene: Set up;Sitting/lateral lean       Functional mobility during ADLs: Min guard;Cueing for safety General ADL Comments: pt with poor activity tolerance. Began education on energy conservations skills     Vision Baseline Vision/History: Wears glasses Wears Glasses: At all times Patient Visual Report: No change from baseline  Perception     Praxis      Pertinent Vitals/Pain Pain Assessment: No/denies pain     Hand Dominance     Extremity/Trunk Assessment Upper Extremity Assessment Upper Extremity Assessment: Overall WFL for tasks assessed   Lower Extremity Assessment Lower Extremity Assessment: Defer to PT evaluation    Cervical / Trunk Assessment Cervical / Trunk Assessment: Normal   Communication Communication Communication: No difficulties   Cognition Arousal/Alertness: Awake/alert Behavior During Therapy: WFL for tasks assessed/performed Overall Cognitive Status: Within Functional Limits for tasks assessed                                    General Comments      Exercises     Shoulder Instructions      Home Living Family/patient expects to be discharged to:: Private residence Living Arrangements: Spouse/significant other Available Help at Discharge: Family;Available 24 hours/day Type of Home: House Home Access: Ramped entrance     Home Layout: One level     Bathroom Shower/Tub: Tub/shower unit;Curtain   Bathroom Toilet: Standard Bathroom Accessibility: No   Home Equipment: Bedside commode;Hospital bed;Tub bench;Wheelchair - [employer] - 2 wheels;Cane - single point          Prior Functioning/Environment Level of Independence: Independent        Comments: Pt with tibial plateau fx in April 2021 and was still getting outpt PT to work on stairs. 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  Reports did fatigue at work sometimes and has w/c just in case.        OT Problem List: Decreased knowledge of use of DME or AE;Decreased knowledge of precautions;Decreased activity tolerance;Cardiopulmonary status limiting activity;Impaired balance (sitting and/or standing)      OT Treatment/Interventions: Self-care/ADL training;Therapeutic exercise;Patient/family education;Balance training;Therapeutic activities;Energy conservation;DME and/or AE instruction    OT Goals(Current goals can be found in the care plan section) Acute Rehab OT Goals Patient Stated Goal: return home OT Goal Formulation: With patient Time For Goal Achievement: 08/28/20 Potential to Achieve Goals: Good  OT Frequency: Min 2X/week   Barriers to D/C:             Co-evaluation              AM-PAC OT "6 Clicks" Daily Activity     Outcome Measure Help from another person eating meals?: None Help from another person taking care of personal grooming?: None Help from another person toileting, which includes using toliet, bedpan, or urinal?: A Little Help from another person bathing (including washing, rinsing, drying)?: A Little Help from another person to put on and taking off regular upper body clothing?: None Help from another person to put on and taking off regular lower body clothing?: A Little 6 Click Score: 21   End of Session Equipment Utilized During Treatment: Gait belt;Rolling walker Nurse Communication: Mobility status  Activity Tolerance: Patient tolerated treatment well Patient left: in bed;with call bell/phone within reach  OT Visit Diagnosis: Unsteadiness on feet (R26.81);Other abnormalities of gait and mobility (R26.89)                Time: 6045-4098 OT Time Calculation (min): 15 min Charges:  OT General Charges $OT Visit: 1 Visit OT Evaluation $OT Eval Moderate Complexity: Byron, MSOT, OTR/L [facility] Office Number: [phone] Pager: [phone]  Zenovia Jarred 08/14/2020, 6:04 PM

## 2020-08-14 NOTE — Progress Notes (Signed)
Pt temperature was unable to be checked on arrival to 5west room 3. Warm blankets were provided. Pt temperature was retaken rectally. Temp was 94.6. On call MD made aware. Awaiting new orders.

## 2020-08-15 ENCOUNTER — Encounter: Payer: Self-pay | Admitting: Physical Therapy

## 2020-08-15 LAB — CBC WITH DIFFERENTIAL/PLATELET
Abs Immature Granulocytes: 0.12 10*3/uL — ABNORMAL HIGH (ref 0.00–0.07)
Basophils Absolute: 0 10*3/uL (ref 0.0–0.1)
Basophils Relative: 0 %
Eosinophils Absolute: 0 10*3/uL (ref 0.0–0.5)
Eosinophils Relative: 0 %
HCT: 41.2 % (ref 36.0–46.0)
Hemoglobin: 13.8 g/dL (ref 12.0–15.0)
Immature Granulocytes: 2 %
Lymphocytes Relative: 12 %
Lymphs Abs: 0.9 10*3/uL (ref 0.7–4.0)
MCH: 31.7 pg (ref 26.0–34.0)
MCHC: 33.5 g/dL (ref 30.0–36.0)
MCV: 94.7 fL (ref 80.0–100.0)
Monocytes Absolute: 0.4 10*3/uL (ref 0.1–1.0)
Monocytes Relative: 5 %
Neutro Abs: 6.5 10*3/uL (ref 1.7–7.7)
Neutrophils Relative %: 81 %
Platelets: 352 10*3/uL (ref 150–400)
RBC: 4.35 MIL/uL (ref 3.87–5.11)
RDW: 15.7 % — ABNORMAL HIGH (ref 11.5–15.5)
WBC: 7.9 10*3/uL (ref 4.0–10.5)
nRBC: 0 % (ref 0.0–0.2)

## 2020-08-15 LAB — COMPREHENSIVE METABOLIC PANEL
ALT: 41 U/L (ref 0–44)
AST: 105 U/L — ABNORMAL HIGH (ref 15–41)
Albumin: 2.6 g/dL — ABNORMAL LOW (ref 3.5–5.0)
Alkaline Phosphatase: 278 U/L — ABNORMAL HIGH (ref 38–126)
Anion gap: 14 (ref 5–15)
BUN: 17 mg/dL (ref 8–23)
CO2: 21 mmol/L — ABNORMAL LOW (ref 22–32)
Calcium: 8.6 mg/dL — ABNORMAL LOW (ref 8.9–10.3)
Chloride: 109 mmol/L (ref 98–111)
Creatinine, Ser: 0.77 mg/dL (ref 0.44–1.00)
GFR calc Af Amer: >60 mL/min (ref >60)
GFR calc non Af Amer: >60 mL/min (ref >60)
Glucose, Bld: 114 mg/dL — ABNORMAL HIGH (ref 70–99)
Potassium: 3.8 mmol/L (ref 3.5–5.1)
Sodium: 144 mmol/L (ref 135–145)
Total Bilirubin: 1.2 mg/dL (ref 0.3–1.2)
Total Protein: 6.9 g/dL (ref 6.5–8.1)

## 2020-08-15 LAB — C-REACTIVE PROTEIN: CRP: 4.4 mg/dL — ABNORMAL HIGH (ref <1.0)

## 2020-08-15 LAB — BRAIN NATRIURETIC PEPTIDE: B Natriuretic Peptide: 111.6 pg/mL — ABNORMAL HIGH (ref 0.0–100.0)

## 2020-08-15 LAB — D-DIMER, QUANTITATIVE: D-Dimer, Quant: 1.28 ug/mL-FEU — ABNORMAL HIGH (ref 0.00–0.50)

## 2020-08-15 LAB — GLUCOSE, CAPILLARY
Glucose-Capillary: 119 mg/dL — ABNORMAL HIGH (ref 70–99)
Glucose-Capillary: 125 mg/dL — ABNORMAL HIGH (ref 70–99)
Glucose-Capillary: 132 mg/dL — ABNORMAL HIGH (ref 70–99)
Glucose-Capillary: 143 mg/dL — ABNORMAL HIGH (ref 70–99)

## 2020-08-15 NOTE — Progress Notes (Addendum)
PROGRESS NOTE    Park Chapdelaine  LDJ:[id number] DOB: [birth year] DOA: 08/12/2020 PCP: Elby Beck, FNP   Chief Complaint  Patient presents with  . Nausea  . 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  Unfortunately patient is not vaccinated.  Assessment & Plan:   Principal Problem:   Acute hypoxemic respiratory failure due to COVID-19 [facility]) Active Problems:   Hypothyroidism   LFT elevation   Essential hypertension  Acute Hypoxic Respiratory Failure 2/2 COVID 19 Pneumonia:  Unvaccinated  CXR with patchy multifocal pneumonia Currently requiring 5 L [place], improved Discussed risk/benefits/off label use of baricitinib/actemra.  Continue baricitinib.   Procal 0.21, not clearly suggestive of secondary bacterial infection - she's on ceftriaxone for possible UTI (though asx). Strict I/O, daily weights Prone as able, OOB, IS, flutter  COVID-19 Labs  Recent Labs    08/12/20 2302 08/12/20 2302 08/13/20 0445 08/14/20 0745 08/15/20 0600  DDIMER  --   --  1.59* 1.36* 1.28*  FERRITIN 756*  --  616*  --   --   LDH 494*  --   --   --   --   CRP 15.6*   < > 13.9* 8.6* 4.4*   < > = values in this interval not displayed.    Lab Results  Component Value Date   [place] ([name]) 08/12/2020   [name] NEGATIVE 03/12/2020   [name] NOT DETECTED 09/08/2019   Hypothermia: suspect this is spurious based on my discussion with nursing, she's going to repeat vitals  Asymptomatic Bacteruria: no urine cx collected, pt on  ceftriaxone.  Will try to add on culture if possible.  Continue ceftriaxone for 5 days as pt will receive baricitinib.   Elevated D Dimer: negative LE [place], follow, downtrending  Elevated BNP: follow echo  Viral Gastroenteritis: resolved  Hypothyroidisim: continue synthroid, TSH mildly elevated - repeat outpatient TSH  Hypertension: home BP meds on hold  Hx Cancer of Cecum s/p hemicolectomy  Hx Liver disease with portal hypertension 2/2 chemotherapy  S/p TIPS: elevated AST and alk phos.  Mildly elevated Bili.  Continue to monitor.  Hyperglycemia: SSI, follow A1c 5.1  Hypokalemia: replace and follow   DVT prophylaxis:lovenox Code Status: full  Family Communication: none at bedside - husband Disposition:   Status is: Inpatient  Remains inpatient appropriate because:Inpatient level of care appropriate due to severity of illness   Dispo: The patient is from: Home              Anticipated d/c is to: Home              Anticipated d/c date is: > 3 days              Patient currently is not medically stable to d/c.   Consultants:   none  Procedures LE [place] Summary:  RIGHT:  - There is no evidence of deep vein thrombosis in the lower extremity.    - No cystic structure found in the popliteal fossa.    LEFT:  - There is no evidence of deep vein thrombosis in the lower extremity.    -  No cystic structure found in the popliteal fossa.   Antimicrobials:  Anti-infectives (From admission, onward)   Start     Dose/Rate Route Frequency Ordered Stop   08/14/20 1000  remdesivir 100 mg in sodium chloride 0.9 % 100 mL IVPB       "Followed by" Linked Group Details   100 mg 200 mL/hr over 30 Minutes Intravenous Daily 08/12/20 2328 08/18/20 0959   08/13/20 0800  cefTRIAXone (ROCEPHIN) 1 g in sodium chloride 0.9 % 100 mL IVPB        1 g 200 mL/hr over 30 Minutes Intravenous Every 24 hours 08/13/20 0708 08/18/20 0759   08/13/20 0030  remdesivir 200 mg in sodium chloride 0.9% 250  mL IVPB       "Followed by" Linked Group Details   200 mg 580 mL/hr over 30 Minutes Intravenous Once 08/12/20 2328 08/13/20 0207      Subjective: No new complaints, feels about the same  Objective: Vitals:   08/14/20 2352 08/15/20 0401 08/15/20 0720 08/15/20 1156  BP: 136/64  135/78 126/63  Pulse: 78  85 63  Resp: 19 20 (!) 25 16  Temp: (!) 97.4 F (36.3 C) (!) 97.4 F (36.3 C) (!) 97.5 F (36.4 C) (!) 95.1 F (35.1 C)  TempSrc: Axillary Axillary Oral Rectal  SpO2: 96%  97% 98%  Weight:  104.7 kg 102.4 kg   Height:        Intake/Output Summary (Last 24 hours) at 08/15/2020 1549 Last data filed at 08/15/2020 1411 Gross per 24 hour  Intake 600 ml  Output 1275 ml  Net -675 ml   Filed Weights   08/12/20 1708 08/15/20 0401 08/15/20 0720  Weight: 99.8 kg 104.7 kg 102.4 kg    Examination:  General: No acute distress. Cardiovascular: Heart sounds show Autie Vasudevan regular rate, and rhythm Lungs: Clear to auscultation bilaterally  Abdomen: Soft, nontender, nondistended  Neurological: Alert and oriented 3. Moves all extremities 4 . Cranial nerves II through XII grossly intact. Skin: Warm and dry. No rashes or lesions. Extremities: No clubbing or cyanosis. No edema.   Data Reviewed: I have personally reviewed following labs and imaging studies  CBC: Recent Labs  Lab 08/12/20 1715 08/13/20 0445 08/14/20 0745 08/15/20 0600  WBC 5.1 4.0 6.3 7.9  NEUTROABS  --  3.2 5.5 6.5  HGB 13.4 13.2 12.9 13.8  HCT 39.7 39.3 38.4 41.2  MCV 94.7 95.9 93.4 94.7  PLT 188 204 271 639    Basic Metabolic Panel: Recent Labs  Lab 08/12/20 1715 08/13/20 0445 08/14/20 0745 08/15/20 0600  NA 139 143 140 144  K 3.5 3.7 3.3* 3.8  CL 106 112* 107 109  CO2 21* 19* 20* 21*  GLUCOSE 115* 105* 158* 114*  BUN 7* 7* 13 17  CREATININE 0.75 0.72 0.77 0.77  CALCIUM 8.3* 8.0* 7.9* 8.6*  MG  --  1.9  --   --     GFR: Estimated Creatinine Clearance: 77.3 mL/min (by C-G formula based on SCr of  0.77 mg/dL).  Liver Function Tests: Recent Labs  Lab 08/12/20 1715 08/13/20 0445 08/14/20 0745 08/15/20 0600  AST 128* 120* 95* 105*  ALT 41 37 36 41  ALKPHOS 313* 292* 252* 278*  BILITOT 1.7* 1.9* 1.4* 1.2  PROT 6.7 6.4* 6.3* 6.9  ALBUMIN 2.6* 2.5* 2.3* 2.6*    CBG: Recent Labs  Lab 08/14/20 1207 08/14/20 1600 08/14/20 2040 08/15/20 0722 08/15/20 1218  GLUCAP 133* 109* 157* 119* 125*  Recent Results (from the past 240 hour(s))  SARS Coronavirus 2 by RT PCR (hospital order, performed in [facility] hospital lab) Nasopharyngeal Nasopharyngeal Swab     Status: Abnormal   Collection Time: 08/12/20  5:35 PM   Specimen: Nasopharyngeal Swab  Result Value Ref Range Status   SARS Coronavirus 2 POSITIVE (Jakyrie Totherow) NEGATIVE Final    Comment: RESULT CALLED TO, READ BACK BY AND VERIFIED WITH: K MUNNETT RN 08/12/20 AT 2130 SK (NOTE) SARS-CoV-2 target nucleic acids are DETECTED  SARS-CoV-2 RNA is generally detectable in upper respiratory specimens  during the acute phase of infection.  Positive results are indicative  of the presence of the identified virus, but do not rule out bacterial infection or co-infection with other pathogens not detected by the test.  Clinical correlation with patient history and  other diagnostic information is necessary to determine patient infection status.  The expected result is negative.  Fact Sheet for Patients:   StrictlyIdeas.no   Fact Sheet for Healthcare Providers:   BankingDealers.co.za    This test is not yet approved or cleared by the [place] FDA and  has been authorized for detection and/or diagnosis of SARS-CoV-2 by FDA under an Emergency Use Authorization (EUA).  This EUA will remain in effect (meaning this test  can be used) for the duration of  the COVID-19 declaration under Section 564(b)(1) of the Act, 21 U.S.C. section 360-bbb-3(b)(1), unless the authorization is terminated or  revoked sooner.  Performed at [facility], [place] [address] [place], [place] [address]   Culture, blood (routine x 2)     Status: None (Preliminary result)   Collection Time: 08/12/20 11:02 PM   Specimen: BLOOD  Result Value Ref Range Status   Specimen Description BLOOD RIGHT WRIST  Final   Special Requests   Final    BOTTLES DRAWN AEROBIC AND ANAEROBIC Blood Culture adequate volume   Culture   Final    NO GROWTH 2 DAYS Performed at [facility], [place] [address], [place], [place] [address]    Report Status PENDING  Incomplete  Culture, blood (routine x 2)     Status: None (Preliminary result)   Collection Time: 08/13/20  1:30 AM   Specimen: BLOOD  Result Value Ref Range Status   Specimen Description BLOOD LEFT ANTECUBITAL  Final   Special Requests   Final    BOTTLES DRAWN AEROBIC AND ANAEROBIC Blood Culture results may not be optimal due to an excessive volume of blood received in culture bottles   Culture   Final    NO GROWTH 2 DAYS Performed at [facility], [place] [address], [place], [place] [address]    Report Status PENDING  Incomplete         Radiology Studies: No results found.      Scheduled Meds: . baricitinib  4 mg Oral Daily  . enoxaparin (LOVENOX) injection  50 mg Subcutaneous Q24H  . insulin aspart  0-5 Units Subcutaneous QHS  . insulin aspart  0-9 Units Subcutaneous TID WC  . levothyroxine  88 mcg Oral Q0600  . methylPREDNISolone (SOLU-MEDROL) injection  60 mg Intravenous Q12H   Continuous Infusions: . cefTRIAXone (ROCEPHIN)  IV 1 g (08/15/20 0855)  . remdesivir 100 mg in NS 100 mL 100 mg (08/15/20 0849)     LOS: 3 days    Time spent: over 30 min    Fayrene Helper, MD [facility]   To contact the attending provider between 7A-7P or  the covering provider during after hours 7P-7A, please log into the web site [email or web address] and access using universal [place] password for that web site. If you do not have the  password, please call the hospital operator.  08/15/2020, 3:49 PM

## 2020-08-15 NOTE — Progress Notes (Signed)
Physical Therapy Treatment Patient Details Name: [name] MRN: [id number] DOB: 1951/09/13 Today's Date: 08/15/2020    History of Present Illness Pt is a 69 y.o. 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 Patient is not vaccinated.    PT Comments    Pt making good progress.  She was able to ambulate in hall today on 2 LPM O2 with sats maintaining >90%.  Required close supervision and min cues for breathing technique with activity.  Cont to progress as able.   Follow Up Recommendations  Outpatient PT;Supervision/Assistance - 24 hour (continue her outpt PT)     Equipment Recommendations  None recommended by PT    Recommendations for Other Services       Precautions / Restrictions Precautions Precautions: Fall Precaution Comments: watch O2    Mobility  Bed Mobility Overal bed mobility: Needs Assistance Bed Mobility: Sit to Supine       Sit to supine: Min guard   General bed mobility comments: in chair at arrival  Transfers Overall transfer level: Needs assistance Equipment used: Rolling walker (2 wheeled) Transfers: Sit to/from Stand Sit to Stand: Min guard         General transfer comment: min guard for safety; cues for safe hand placement performed x 3  Ambulation/Gait Ambulation/Gait assistance: Min guard Gait Distance (Feet): 80 Feet Assistive device: Rolling walker (2 wheeled) Gait Pattern/deviations: Step-through pattern;Decreased stride length Gait velocity: decreased   General Gait Details: min cues for RW and controlled  breathing   Stairs             Wheelchair Mobility    Modified Rankin (Stroke Patients Only)       Balance Overall balance assessment: Needs assistance Sitting-balance support: No upper extremity supported;Feet supported Sitting balance-Leahy Scale: Good     Standing balance support: No upper extremity supported Standing balance-Leahy Scale: Good Standing balance comment: Able to perform toielting ADLs in standing; close supervision for safety                            Cognition Arousal/Alertness: Awake/alert Behavior During Therapy: WFL for tasks assessed/performed Overall Cognitive Status: Within Functional Limits for tasks assessed                                 General Comments: little HOH      Exercises      General Comments General comments (skin integrity, edema, etc.): Pt on 2 LPM O2 with sats 98% rest and 90 % ambulation.  Cues for pursed lip breathing as she fatigued.  Encouraged to continue incentive spirometer and flutter valve      Pertinent Vitals/Pain Pain Assessment: No/denies pain    Home Living                      Prior Function            PT Goals (current goals can now be found in the care plan section) Acute Rehab PT Goals Patient Stated Goal: return home PT Goal Formulation: With  patient Time For Goal Achievement: 08/28/20 Potential to Achieve Goals: Good Progress towards PT goals: Progressing toward goals    Frequency    Min 3X/week      PT Plan Current plan remains appropriate    Co-evaluation              AM-PAC PT "6 Clicks" Mobility   Outcome Measure  Help needed turning from your back to your side while in a flat bed without using bedrails?: None Help needed moving from lying on your back to sitting on the side of a flat bed without using bedrails?: None Help needed moving to and from a bed to a chair (including a wheelchair)?: None Help needed standing up from a  chair using your arms (e.g., wheelchair or bedside chair)?: None Help needed to walk in hospital room?: A Little Help needed climbing 3-5 steps with a railing? : A Little 6 Click Score: 22    End of Session Equipment Utilized During Treatment: Gait belt Activity Tolerance: Patient tolerated treatment well Patient left: in bed;with call bell/phone within reach Nurse Communication: Mobility status PT Visit Diagnosis: Other abnormalities of gait and mobility (R26.89);Muscle weakness (generalized) (M62.81)     Time: 3358-2518 PT Time Calculation (min) (ACUTE ONLY): 25 min  Charges:  $Gait Training: 8-22 mins $Therapeutic Activity: 8-22 mins                     Abran Richard, PT Acute Rehab Services Pager [phone] [facility] [place] 08/15/2020, 5:41 PM

## 2020-08-16 ENCOUNTER — Telehealth: Payer: Self-pay | Admitting: *Deleted

## 2020-08-16 ENCOUNTER — Inpatient Hospital Stay (HOSPITAL_COMMUNITY): Payer: Medicare Other

## 2020-08-16 DIAGNOSIS — I361 Nonrheumatic tricuspid (valve) insufficiency: Secondary | ICD-10-CM

## 2020-08-16 LAB — COMPREHENSIVE METABOLIC PANEL
ALT: 47 U/L — ABNORMAL HIGH (ref 0–44)
AST: 109 U/L — ABNORMAL HIGH (ref 15–41)
Albumin: 2.4 g/dL — ABNORMAL LOW (ref 3.5–5.0)
Alkaline Phosphatase: 223 U/L — ABNORMAL HIGH (ref 38–126)
Anion gap: 14 (ref 5–15)
BUN: 21 mg/dL (ref 8–23)
CO2: 20 mmol/L — ABNORMAL LOW (ref 22–32)
Calcium: 8.5 mg/dL — ABNORMAL LOW (ref 8.9–10.3)
Chloride: 108 mmol/L (ref 98–111)
Creatinine, Ser: 0.79 mg/dL (ref 0.44–1.00)
GFR calc Af Amer: >60 mL/min (ref >60)
GFR calc non Af Amer: >60 mL/min (ref >60)
Glucose, Bld: 103 mg/dL — ABNORMAL HIGH (ref 70–99)
Potassium: 3.9 mmol/L (ref 3.5–5.1)
Sodium: 142 mmol/L (ref 135–145)
Total Bilirubin: 1.1 mg/dL (ref 0.3–1.2)
Total Protein: 6.1 g/dL — ABNORMAL LOW (ref 6.5–8.1)

## 2020-08-16 LAB — CBC WITH DIFFERENTIAL/PLATELET
Abs Immature Granulocytes: 0.12 10*3/uL — ABNORMAL HIGH (ref 0.00–0.07)
Basophils Absolute: 0 10*3/uL (ref 0.0–0.1)
Basophils Relative: 0 %
Eosinophils Absolute: 0 10*3/uL (ref 0.0–0.5)
Eosinophils Relative: 0 %
HCT: 37.7 % (ref 36.0–46.0)
Hemoglobin: 12.5 g/dL (ref 12.0–15.0)
Immature Granulocytes: 2 %
Lymphocytes Relative: 11 %
Lymphs Abs: 0.7 10*3/uL (ref 0.7–4.0)
MCH: 31.6 pg (ref 26.0–34.0)
MCHC: 33.2 g/dL (ref 30.0–36.0)
MCV: 95.2 fL (ref 80.0–100.0)
Monocytes Absolute: 0.6 10*3/uL (ref 0.1–1.0)
Monocytes Relative: 9 %
Neutro Abs: 5.6 10*3/uL (ref 1.7–7.7)
Neutrophils Relative %: 78 %
Platelets: 283 10*3/uL (ref 150–400)
RBC: 3.96 MIL/uL (ref 3.87–5.11)
RDW: 15.5 % (ref 11.5–15.5)
WBC: 7 10*3/uL (ref 4.0–10.5)
nRBC: 0 % (ref 0.0–0.2)

## 2020-08-16 LAB — ECHOCARDIOGRAM LIMITED
Area-P 1/2: 2.26 cm2
Height: 64 in
S' Lateral: 3 cm
Weight: 3640.24 oz

## 2020-08-16 LAB — BRAIN NATRIURETIC PEPTIDE: B Natriuretic Peptide: 113 pg/mL — ABNORMAL HIGH (ref 0.0–100.0)

## 2020-08-16 LAB — D-DIMER, QUANTITATIVE: D-Dimer, Quant: 1.12 ug/mL-FEU — ABNORMAL HIGH (ref 0.00–0.50)

## 2020-08-16 LAB — GLUCOSE, CAPILLARY
Glucose-Capillary: 99 mg/dL (ref 70–99)
Glucose-Capillary: 95 mg/dL (ref 70–99)
Glucose-Capillary: 167 mg/dL — ABNORMAL HIGH (ref 70–99)
Glucose-Capillary: 138 mg/dL — ABNORMAL HIGH (ref 70–99)

## 2020-08-16 LAB — C-REACTIVE PROTEIN: CRP: 1.6 mg/dL — ABNORMAL HIGH (ref <1.0)

## 2020-08-16 NOTE — Progress Notes (Signed)
PROGRESS NOTE    [name]  ZHY:[id number] DOB: 22-Jul-1951 DOA: 08/12/2020 PCP: Katherine Beck, FNP   Chief Complaint  Patient presents with  . Nausea  . 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  Unfortunately patient is not vaccinated.  Assessment & Plan:   Principal Problem:   Acute hypoxemic respiratory failure due to COVID-19 [facility]) Active Problems:   Hypothyroidism   LFT elevation   Essential hypertension  Acute Hypoxic Respiratory Failure 2/2 COVID 19 Pneumonia:  Unvaccinated  CXR with patchy multifocal pneumonia Currently requiring 2 L [place], improving Discussed risk/benefits/off label use of baricitinib/actemra.  Continue baricitinib.  Will complete remdesivir 9/25.  Continue steroids.  Can begin to discuss discharge after she completes remdesivir based on resp status. Procal 0.21, not clearly suggestive of secondary bacterial infection - she's on ceftriaxone for possible UTI (though asx). Strict I/O, daily weights Prone as able, OOB, IS, flutter  COVID-19 Labs  Recent Labs    08/14/20 0745 08/15/20 0600  DDIMER 1.36* 1.28*  CRP 8.6* 4.4*    Lab Results  Component Value Date   SARSCOV2NAA POSITIVE ([name]) 08/12/2020   [place] NEGATIVE 03/12/2020   SARSCOV2NAA NOT DETECTED 09/08/2019   Hypothermia: suspect this is spurious based on my discussion with nursing, improved.  Asymptomatic Bacteruria: no urine cx collected, pt on ceftriaxone.  Will try to add on culture if possible.  Continue ceftriaxone for  5 days as pt will receive baricitinib.   Elevated D Dimer: negative LE [place], follow, downtrending  Elevated BNP: Echo with EF 60-65%, see report  Viral Gastroenteritis: resolved  Hypothyroidisim: continue synthroid, TSH mildly elevated - repeat outpatient TSH  Hypertension: home BP meds on hold  Hx Cancer of Cecum s/p hemicolectomy  Hx Liver disease with portal hypertension 2/2 chemotherapy  S/p TIPS: elevated AST and alk phos, fluctuating.  Bili improved.  Continue to monitor.  Hyperglycemia: SSI, follow A1c 5.1  Hypokalemia: replace and follow   DVT prophylaxis:lovenox Code Status: full  Family Communication: none at bedside - husband Disposition:   Status is: Inpatient  Remains inpatient appropriate because:Inpatient level of care appropriate due to severity of illness   Dispo: The patient is from: Home              Anticipated d/c is to: Home              Anticipated d/c date is: > 3 days              Patient currently is not medically stable to d/c.   Consultants:   none  Procedures LE [place] Summary:  RIGHT:  - There is no evidence of deep vein thrombosis in the lower extremity.    - No cystic structure found in the popliteal fossa.    LEFT:  - There is no evidence of deep vein thrombosis in the lower extremity.    - No cystic structure found in the popliteal fossa.   Echo IMPRESSIONS    1. Left ventricular ejection fraction, by estimation, is 60 to 65%. The  left ventricle has normal function. The left ventricle has no  regional  wall motion abnormalities. Left ventricular diastolic function could not  be evaluated.  2. Right ventricular systolic function is normal. The right ventricular  size is normal. There is normal pulmonary artery systolic pressure. The  estimated right ventricular systolic pressure is 88.4 mmHg.  3. The mitral valve is degenerative. No evidence of mitral valve  regurgitation. No evidence of mitral stenosis. Moderate mitral  annular  calcification.  4. The aortic valve is tricuspid. Aortic valve regurgitation is not  visualized. No aortic stenosis is present.  5. The inferior vena cava is normal in size with greater than 50%  respiratory variability, suggesting right atrial pressure of 3 mmHg.   Comparison(s): No significant change from prior study.   Antimicrobials:  Anti-infectives (From admission, onward)   Start     Dose/Rate Route Frequency Ordered Stop   08/14/20 1000  remdesivir 100 mg in sodium chloride 0.9 % 100 mL IVPB       "Followed by" Linked Group Details   100 mg 200 mL/hr over 30 Minutes Intravenous Daily 08/12/20 2328 08/18/20 0959   08/13/20 0800  cefTRIAXone (ROCEPHIN) 1 g in sodium chloride 0.9 % 100 mL IVPB        1 g 200 mL/hr over 30 Minutes Intravenous Every 24 hours 08/13/20 0708 08/18/20 0759   08/13/20 0030  remdesivir 200 mg in sodium chloride 0.9% 250 mL IVPB       "Followed by" Linked Group Details   200 mg 580 mL/hr over 30 Minutes Intravenous Once 08/12/20 2328 08/13/20 0207      Subjective: No new complaints.  Objective: Vitals:   08/15/20 1600 08/15/20 2036 08/16/20 0532 08/16/20 0547  BP:  (!) 145/73 130/63   Pulse:  69 65 74  Resp:  17 18 (!) 21  Temp: (!) 97.3 F (36.3 C) (!) 97.5 F (36.4 C) (!) 97.5 F (36.4 C)   TempSrc: Oral Axillary Axillary   SpO2:  97% (!) 87% 94%  Weight:   103.2 kg   Height:        Intake/Output Summary (Last 24 hours) at 08/16/2020 1112 Last data filed at 08/16/2020 0918 Gross per 24 hour  Intake 680 ml  Output 200 ml  Net 480 ml   Filed Weights   08/15/20 0401 08/15/20 0720 08/16/20 0532  Weight: 104.7 kg 102.4 kg 103.2 kg    Examination:  General: No acute distress. Cardiovascular: Heart sounds show [name] regular rate, and rhythm. Lungs: Clear to auscultation bilaterally Abdomen: Soft, nontender, nondistended Neurological: Alert and oriented 3. Moves all extremities 4 Cranial nerves II through XII grossly  intact. Skin: Warm and dry. No rashes or lesions. Extremities: chronic mild edema in LLE (prior fx)   Data Reviewed: I have personally reviewed following labs and imaging studies  CBC: Recent Labs  Lab 08/12/20 1715 08/13/20 0445 08/14/20 0745 08/15/20 0600  WBC 5.1 4.0 6.3 7.9  NEUTROABS  --  3.2 5.5 6.5  HGB 13.4 13.2 12.9 13.8  HCT 39.7 39.3 38.4 41.2  MCV 94.7 95.9 93.4 94.7  PLT 188 204 271 166    Basic Metabolic Panel: Recent Labs  Lab 08/12/20 1715 08/13/20 0445 08/14/20 0745 08/15/20 0600  NA 139 143 140 144  K 3.5 3.7 3.3* 3.8  CL 106 112* 107 109  CO2 21* 19* 20* 21*  GLUCOSE 115* 105* 158* 114*  BUN 7* 7* 13 17  CREATININE 0.75 0.72 0.77 0.77  CALCIUM 8.3* 8.0* 7.9* 8.6*  MG  --  1.9  --   --     GFR: Estimated Creatinine Clearance: 77.6 mL/min (by C-G formula based on SCr of 0.77 mg/dL).  Liver Function Tests: Recent Labs  Lab 08/12/20 1715 08/13/20 0445 08/14/20 0745 08/15/20 0600  AST 128* 120* 95* 105*  ALT 41 37 36 41  ALKPHOS 313* 292* 252* 278*  BILITOT 1.7* 1.9* 1.4* 1.2  PROT 6.7 6.4* 6.3* 6.9  ALBUMIN 2.6* 2.5* 2.3* 2.6*    CBG: Recent Labs  Lab 08/15/20 0722 08/15/20 1218 08/15/20 1609 08/15/20 2135 08/16/20 0726  GLUCAP 119* 125* 132* 143* 99     Recent Results (from the past 240 hour(s))  SARS Coronavirus 2 by RT PCR (hospital order, performed in [facility] hospital lab) Nasopharyngeal Nasopharyngeal Swab     Status: Abnormal   Collection Time: 08/12/20  5:35 PM   Specimen: Nasopharyngeal Swab  Result Value Ref Range Status   SARS Coronavirus 2 POSITIVE ([facility]) NEGATIVE Final    Comment: RESULT CALLED TO, READ BACK BY AND VERIFIED WITH: K MUNNETT RN 08/12/20 AT 2130 SK (NOTE) SARS-CoV-2 target nucleic acids are DETECTED  SARS-CoV-2 RNA is generally detectable in upper respiratory specimens  during the acute phase of infection.  Positive results are indicative  of the presence of the identified virus, but do not  rule out bacterial infection or co-infection with other pathogens not detected by the test.  Clinical correlation with patient history and  other diagnostic information is necessary to determine patient infection status.  The expected result is negative.  Fact Sheet for Patients:   StrictlyIdeas.no   Fact Sheet for Healthcare Providers:   BankingDealers.co.za    This test is not yet approved or cleared by the [place] FDA and  has been authorized for detection and/or diagnosis of SARS-CoV-2 by FDA under an Emergency Use Authorization (EUA).  This EUA will remain in effect (meaning this test  can be used) for the duration of  the COVID-19 declaration under Section 564(b)(1) of the Act, 21 U.S.C. section 360-bbb-3(b)(1), unless the authorization is terminated or revoked sooner.  Performed at [facility], [place] [address], [place], [place] [address]   Culture, blood (routine x 2)     Status: None (Preliminary result)   Collection Time: 08/12/20 11:02 PM   Specimen: BLOOD  Result Value Ref Range Status   Specimen Description BLOOD RIGHT WRIST  Final   Special Requests   Final    BOTTLES DRAWN AEROBIC AND ANAEROBIC Blood Culture adequate volume   Culture   Final    NO GROWTH 2 DAYS Performed at [facility], [place] [address], [place], [place] [address]    Report Status PENDING  Incomplete  Culture, blood (routine x 2)     Status: None (Preliminary result)   Collection Time: 08/13/20  1:30 AM   Specimen: BLOOD  Result Value Ref Range Status   Specimen Description BLOOD LEFT ANTECUBITAL  Final   Special Requests   Final    BOTTLES DRAWN AEROBIC AND ANAEROBIC Blood Culture results may not be optimal due to an excessive volume of blood received in culture bottles   Culture   Final    NO GROWTH 2 DAYS Performed at [facility], [place] [address] [place], [place] [address]    Report Status PENDING  Incomplete          Radiology Studies: ECHOCARDIOGRAM LIMITED  Result Date: 08/16/2020    ECHOCARDIOGRAM LIMITED REPORT   Patient Name:  [name] Date of Exam: 08/16/2020 Medical Rec #:  [id number]       Height:       64.0 in Accession #:    [id number]      Weight:       227.5 lb Date of Birth:  [birth year]        BSA:          2.067 m Patient Age:    55 years        BP:           130/63 mmHg Patient Gender: F               HR:           67 bpm. Exam Location:  Inpatient Procedure: Limited Echo, Limited Color Doppler and Cardiac Doppler Indications:    Elevated Brain Natriuretic Peptide level  History:        Patient has prior history of Echocardiogram examinations, most                 recent 04/09/2020. COVID-19.  Sonographer:    Mikki Santee RDCS (AE) Referring Phys: [phone] Ramona Ruark CALDWELL POWELL JR IMPRESSIONS  1. Left ventricular ejection fraction, by estimation, is 60 to 65%. The left ventricle has normal function. The left ventricle has no regional wall motion abnormalities. Left ventricular diastolic function could not be evaluated.  2. Right ventricular systolic function is normal. The right ventricular size is normal. There is normal pulmonary artery systolic pressure. The estimated right ventricular systolic pressure is 68.1 mmHg.  3. The mitral valve is degenerative. No evidence of mitral valve regurgitation. No evidence of mitral stenosis. Moderate mitral annular calcification.  4. The aortic valve is tricuspid. Aortic valve regurgitation is not visualized. No aortic stenosis is present.  5. The inferior vena cava is normal in size with greater than 50% respiratory variability, suggesting right atrial pressure of 3 mmHg. Comparison(s): No significant change from prior study. FINDINGS  Left Ventricle: Left ventricular ejection fraction, by estimation, is 60 to 65%. The left ventricle has normal function. The left ventricle has no regional wall motion abnormalities. The left ventricular internal cavity size  was normal in size. There is  no left ventricular hypertrophy. Left ventricular diastolic function could not be evaluated. Left ventricular diastolic function could not be evaluated due to mitral annular calcification (moderate or greater). Right Ventricle: The right ventricular size is normal. No increase in right ventricular wall thickness. Right ventricular systolic function is normal. There is normal pulmonary artery systolic pressure. The tricuspid regurgitant velocity is 2.02 m/s, and  with an assumed right atrial pressure of 3 mmHg, the estimated right ventricular systolic pressure is 27.5 mmHg. Left Atrium: Left atrial size was normal in size. Right Atrium: Right atrial size was normal in size. Pericardium: Trivial pericardial effusion is present. Mitral Valve: The mitral valve is degenerative in appearance. Moderate mitral annular calcification. No evidence of mitral valve stenosis. Tricuspid Valve: The tricuspid valve is grossly normal. Tricuspid valve regurgitation is mild . No evidence of tricuspid stenosis. Aortic Valve: The aortic valve is tricuspid. Aortic valve regurgitation is not visualized. No aortic stenosis is present. Pulmonic Valve: The pulmonic valve was grossly normal. Pulmonic valve regurgitation is trivial. No evidence of pulmonic stenosis. Aorta: The aortic root is normal in size and structure. Venous: The inferior vena cava is normal in size with greater than 50% respiratory variability, suggesting right atrial pressure of 3 mmHg. IAS/Shunts: The atrial septum is grossly  normal. LEFT VENTRICLE PLAX 2D LVIDd:         5.20 cm  Diastology LVIDs:         3.00 cm  LV e' medial:    5.22 cm/s LV PW:         0.90 cm  LV E/e' medial:  22.6 LV IVS:        0.90 cm  LV e' lateral:   8.49 cm/s LVOT diam:     1.90 cm  LV E/e' lateral: 13.9 LVOT Area:     2.84 cm  LEFT ATRIUM             Index       RIGHT ATRIUM           Index LA diam:        2.60 cm 1.26 cm/m  RA Area:     18.20 cm LA Vol (A2C):    44.7 ml 21.63 ml/m RA Volume:   50.30 ml  24.34 ml/m LA Vol (A4C):   51.0 ml 24.68 ml/m LA Biplane Vol: 52.8 ml 25.55 ml/m   AORTA Ao Root diam: 2.90 cm MITRAL VALVE                TRICUSPID VALVE MV Area (PHT): 2.26 cm     TR Peak grad:   16.3 mmHg MV Decel Time: 335 msec     TR Vmax:        202.00 cm/s MV E velocity: 118.00 cm/s MV Shadawn Hanaway velocity: 115.00 cm/s  SHUNTS MV E/Anylah Scheib ratio:  1.03         Systemic Diam: 1.90 cm Eleonore Chiquito MD Electronically signed by Eleonore Chiquito MD Signature Date/Time: 08/16/2020/11:03:37 AM    Final         Scheduled Meds: . baricitinib  4 mg Oral Daily  . enoxaparin (LOVENOX) injection  50 mg Subcutaneous Q24H  . insulin aspart  0-5 Units Subcutaneous QHS  . insulin aspart  0-9 Units Subcutaneous TID WC  . levothyroxine  88 mcg Oral Q0600  . methylPREDNISolone (SOLU-MEDROL) injection  60 mg Intravenous Q12H   Continuous Infusions: . cefTRIAXone (ROCEPHIN)  IV 1 g (08/16/20 0900)  . remdesivir 100 mg in NS 100 mL 100 mg (08/16/20 0920)     LOS: 4 days    Time spent: over 58 min    Fayrene Helper, MD [facility]   To contact the attending provider between 7A-7P or the covering provider during after hours 7P-7A, please log into the web site [email or web address] and access using universal [place] password for that web site. If you do not have the password, please call the hospital operator.  08/16/2020, 11:12 AM

## 2020-08-16 NOTE — Telephone Encounter (Signed)
Pt's spouse is asking for the provider to call him back directly PCP out of the office so will route to Stephen, NP  Pt's spouse thinks that pt had a "false positive" covid test and because he isn't vaccinated and he was taking care of her with no mask for days at home before she finally when to the hospital. Spouse said she is about to get d/c but they want to take her to a rehab because she may actually get covid at the rehab and he also can't take care of her at the rehab facility because he isn't allowed in the building since he is not vaccinated. Pt said he wanted to talk to our office about it not the hospital because he said he thinks pt will be better coming home with him so he can take care of her instead of going to a rehab center and getting sicker. Spouse said he was going to get a covid vaccine so he can see pt in the rehab center but due to his exposure the earliest he can get a vaccine is this Wednesday coming up. Spouse question if pt should get another covid test and if it's negative then he thinks that she didn't have it and it was a false positive all along and a regular "bug" just caused her to get pneumonia   Ozzie Hoyle (spouse) CB # [phone]

## 2020-08-16 NOTE — Telephone Encounter (Signed)
Please notify patient's spouse that I have no control over her discharge, discharge planning is determined by the hospital physicians. If they feel that she needs to be in rehab then I support that decision and recommend that he get vaccinated on Wednesday as recommended. Based off of what I can see in her chart, she does have Covid-19 and pneumonia. It also appears she's had some difficulty with her respiratory status. For these reasons she seems to be in the right place for now, and post hospital rehab would be very reasonable.   I will cc this to her PCP as FYI.

## 2020-08-16 NOTE — Progress Notes (Signed)
  Echocardiogram 2D Echocardiogram has been performed.  Jennette Dubin 08/16/2020, 10:58 AM

## 2020-08-17 LAB — CBC WITH DIFFERENTIAL/PLATELET
Abs Immature Granulocytes: 0.24 10*3/uL — ABNORMAL HIGH (ref 0.00–0.07)
Basophils Absolute: 0 10*3/uL (ref 0.0–0.1)
Basophils Relative: 0 %
Eosinophils Absolute: 0 10*3/uL (ref 0.0–0.5)
Eosinophils Relative: 0 %
HCT: 37.3 % (ref 36.0–46.0)
Hemoglobin: 12.4 g/dL (ref 12.0–15.0)
Immature Granulocytes: 5 %
Lymphocytes Relative: 11 %
Lymphs Abs: 0.6 10*3/uL — ABNORMAL LOW (ref 0.7–4.0)
MCH: 31.4 pg (ref 26.0–34.0)
MCHC: 33.2 g/dL (ref 30.0–36.0)
MCV: 94.4 fL (ref 80.0–100.0)
Monocytes Absolute: 0.2 10*3/uL (ref 0.1–1.0)
Monocytes Relative: 4 %
Neutro Abs: 4 10*3/uL (ref 1.7–7.7)
Neutrophils Relative %: 80 %
Platelets: 265 10*3/uL (ref 150–400)
RBC: 3.95 MIL/uL (ref 3.87–5.11)
RDW: 15.2 % (ref 11.5–15.5)
WBC: 5 10*3/uL (ref 4.0–10.5)
nRBC: 0 % (ref 0.0–0.2)

## 2020-08-17 LAB — COMPREHENSIVE METABOLIC PANEL
ALT: 46 U/L — ABNORMAL HIGH (ref 0–44)
AST: 92 U/L — ABNORMAL HIGH (ref 15–41)
Albumin: 2.4 g/dL — ABNORMAL LOW (ref 3.5–5.0)
Alkaline Phosphatase: 213 U/L — ABNORMAL HIGH (ref 38–126)
Anion gap: 11 (ref 5–15)
BUN: 21 mg/dL (ref 8–23)
CO2: 24 mmol/L (ref 22–32)
Calcium: 8.3 mg/dL — ABNORMAL LOW (ref 8.9–10.3)
Chloride: 106 mmol/L (ref 98–111)
Creatinine, Ser: 0.77 mg/dL (ref 0.44–1.00)
GFR calc Af Amer: >60 mL/min (ref >60)
GFR calc non Af Amer: >60 mL/min (ref >60)
Glucose, Bld: 184 mg/dL — ABNORMAL HIGH (ref 70–99)
Potassium: 3.6 mmol/L (ref 3.5–5.1)
Sodium: 141 mmol/L (ref 135–145)
Total Bilirubin: 1.2 mg/dL (ref 0.3–1.2)
Total Protein: 6 g/dL — ABNORMAL LOW (ref 6.5–8.1)

## 2020-08-17 LAB — GLUCOSE, CAPILLARY
Glucose-Capillary: 115 mg/dL — ABNORMAL HIGH (ref 70–99)
Glucose-Capillary: 128 mg/dL — ABNORMAL HIGH (ref 70–99)
Glucose-Capillary: 161 mg/dL — ABNORMAL HIGH (ref 70–99)

## 2020-08-17 LAB — BRAIN NATRIURETIC PEPTIDE: B Natriuretic Peptide: 79 pg/mL (ref 0.0–100.0)

## 2020-08-17 LAB — D-DIMER, QUANTITATIVE: D-Dimer, Quant: 0.87 ug/mL-FEU — ABNORMAL HIGH (ref 0.00–0.50)

## 2020-08-17 LAB — C-REACTIVE PROTEIN: CRP: 1.2 mg/dL — ABNORMAL HIGH (ref <1.0)

## 2020-08-17 MED ORDER — PREDNISONE 20 MG PO TABS: ORAL_TABLET | ORAL | 0 refills | Status: AC

## 2020-08-17 NOTE — Progress Notes (Signed)
SATURATION QUALIFICATIONS: (This note is used to comply with regulatory documentation for home oxygen)  Patient Saturations on Room Air at Rest = 99%  Patient Saturations on Room Air while Ambulating = 90%  Patient Saturations on oxygen while Ambulating = No oxygen required with ambulation  Please briefly explain why patient needs home oxygen:

## 2020-08-17 NOTE — Discharge Summary (Signed)
Physician Discharge Summary  [name] QOX:[id number] DOB: 04-15-1951 DOA: 08/12/2020  PCP: Katherine Belfast, FNP  Admit date: 08/12/2020 Discharge date: 08/17/2020  Time spent: 40 minutes  Recommendations for Outpatient Follow-up:  1. Follow outpatient CBC/CMP 2. Follow respiratory status outpatient  3. Follow blood pressure outpt, losartan resumed on discharge 4. Follow lfts 5. Follow repeat thyroid function tests outpatient after recovery from acute illness 6. Follow blood sugars after steroids completed 7. 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 Unfortunately patient is not vaccinated.  She was admitted for COVID 19 pneumonia.  She's improved with steroids, remdesivir, and baricitinib.  She's improved on RA at the time of discharge.  See below for additional details  Hospital Course:  Acute Hypoxic Respiratory Failure 2/2 COVID 19 Pneumonia:  Unvaccinated  CXR with patchy multifocal pneumonia On RA at discharge Discussed risk/benefits/off label use of baricitinib/actemra.  Completed remdesivir.  Will d/c with steroid taper.  Stop baricitinib at discharge.  Procal  0.21, not clearly suggestive of secondary bacterial infection - she's on ceftriaxone for possible UTI (though asx). Strict I/O, daily weights Prone as able, OOB, IS, flutter  COVID-19 Labs  Recent Labs    08/15/20 0600 08/16/20 1149 08/17/20 0135  DDIMER 1.28* 1.12* 0.87*  CRP 4.4* 1.6* 1.2*    Lab Results  Component Value Date   SARSCOV2NAA POSITIVE ([name]) 08/12/2020   SARSCOV2NAA NEGATIVE 03/12/2020   SARSCOV2NAA NOT DETECTED 09/08/2019   Hypothermia: resolved  Asymptomatic Bacteruria: no urine cx collected, pt on ceftriaxone.  Will try to add on culture if possible.  s/p 5 days ceftriaxone.  Elevated D Dimer: negative LE [place], follow, downtrending  Elevated BNP: Echo with EF 60-65%, see report  Viral Gastroenteritis: resolved  Hypothyroidisim: continue synthroid, TSH mildly elevated - repeat outpatient TSH  Hypertension: home BP meds on hold  Hx Cancer of Cecum s/p hemicolectomy  Hx Liver disease with portal hypertension 2/2 chemotherapy  S/p TIPS: elevated AST and alk phos, fluctuating.  Bili improved.  Continue to monitor.  Hyperglycemia: SSI, follow A1c 5.1  Hypokalemia: replace and follow   Procedures: Echo IMPRESSIONS    1. Left ventricular ejection fraction, by estimation, is 60 to 65%. The  left ventricle has normal function. The left ventricle has no regional  wall motion abnormalities. Left ventricular diastolic function could not  be evaluated.  2. Right ventricular systolic function is normal. The right ventricular  size is normal. There is normal pulmonary artery systolic pressure. The  estimated right ventricular systolic pressure is 19.3 mmHg.  3. The mitral valve is degenerative. No evidence of mitral valve  regurgitation. No evidence of mitral stenosis. Moderate mitral annular  calcification.  4. The aortic valve is tricuspid. Aortic valve regurgitation is not  visualized. No aortic stenosis  is present.  5. The inferior vena  cava is normal in size with greater than 50%  respiratory variability, suggesting right atrial pressure of 3 mmHg.   Comparison(s): No significant change from prior study.   LE [place] Summary:  RIGHT:  - There is no evidence of deep vein thrombosis in the lower extremity.    - No cystic structure found in the popliteal fossa.    LEFT:  - There is no evidence of deep vein thrombosis in the lower extremity.    - No cystic structure found in the popliteal fossa.   Consultations:  none  Discharge Exam: Vitals:   08/17/20 0446 08/17/20 0839  BP: 137/74   Pulse: 66   Resp: 19   Temp: 97.6 F (36.4 C)   SpO2: 94% 99%   Feeling well, no new concerns today Eager to possibly go home Discussed with husband   General: No acute distress. Cardiovascular: Heart sounds show [name] regular rate, and rhythm. Lungs: Clear to auscultation bilaterally Abdomen: Soft, nontender, nondistended  Neurological: Alert and oriented 3. Moves all extremities 4. Cranial nerves II through XII grossly intact. Skin: Warm and dry. No rashes or lesions. Extremities: No clubbing or cyanosis. No edema.   Discharge Instructions   Discharge Instructions    Call MD for:  difficulty breathing, headache or visual disturbances   Complete by: As directed    Call MD for:  extreme fatigue   Complete by: As directed    Call MD for:  hives   Complete by: As directed    Call MD for:  persistant dizziness or light-headedness   Complete by: As directed    Call MD for:  persistant nausea and vomiting   Complete by: As directed    Call MD for:  redness, tenderness, or signs of infection (pain, swelling, redness, odor or green/yellow discharge around incision site)   Complete by: As directed    Call MD for:  severe uncontrolled pain   Complete by: As directed    Call MD for:  temperature >100.4   Complete by: As directed    Diet - low sodium heart healthy   Complete by: As directed    Discharge instructions    Complete by: As directed    You were seen for COVID 19 pneumonia.  You've improved with steroids, remdesivir, and baricitinib.  You'll be discharged today with [name] steroid taper.  You should continue to isolate for 21 days from your positive test (your isolation can be discontinued after October 11th if you continue to improve without the need for fever reducing medications).    You'll need repeat thyroid tests after you've recovered as an outpatient to ensure your synthroid dosing is correct.  Your blood sugars are up with your steroid use, follow your blood sugars after your steroids are completed with your PCP.  Please follow up with your PCP as an outpatient after your quarantine has completed.  Return for new, recurrent, or worsening symptoms.  Please ask your PCP to request records from this hospitalization so they know what was done and what the next steps will be.   Increase activity slowly   Complete by: As directed      Allergies as of 08/17/2020      Reactions   Contrast Media [iodinated Diagnostic Agents] Hives      Medication List    STOP taking these medications   enoxaparin 60 MG/0.6ML injection Commonly known as: LOVENOX   ibuprofen 400 MG tablet  Commonly known as: ADVIL   oxyCODONE-acetaminophen 5-325 MG tablet Commonly known as: PERCOCET/ROXICET     TAKE these medications   Elderberry 575 MG/5ML Syrp Take 1,725 mg by mouth at bedtime.   Fish Oil 1000 MG Caps Take 1,000 mg by mouth at bedtime.   Glucosamine-Chondroitin 1500-1200 MG/30ML Liqd Take 30 mLs by mouth at bedtime.   losartan 100 MG tablet Commonly known as: COZAAR Take 1 tablet (100 mg total) by mouth daily. What changed: when to take this   predniSONE 20 MG tablet Commonly known as: DELTASONE Take 2 tablets (40 mg total) by mouth daily for 3 days, THEN 1.5 tablets (30 mg total) daily for 2 days, THEN 1 tablet (20 mg total) daily for 2 days, THEN 0.5 tablets (10 mg total) daily for 2  days. Start taking on: August 17, 2020   Synthroid 88 MCG tablet Generic drug: levothyroxine Take 1 tablet (88 mcg total) by mouth daily before breakfast.   TURMERIC PO Take 1 tablet by mouth at bedtime.   Vitamin Cassondra Stachowski 2400 MCG (8000 UT) Tabs Take 2,400 mcg by mouth at bedtime.   Vitamin B-12 5000 MCG Tbdp Take 5,000 mcg by mouth at bedtime.   vitamin C 500 MG tablet Commonly known as: ASCORBIC ACID Take 500 mg by mouth at bedtime.   Vitamin D3 50 MCG (2000 UT) Tabs Take 2,000 Units by mouth at bedtime.   zinc gluconate 50 MG tablet Take 50 mg by mouth at bedtime.      Allergies  Allergen Reactions  . Contrast Media [Iodinated Diagnostic Agents] Hives      The results of significant diagnostics from this hospitalization (including imaging, microbiology, ancillary and laboratory) are listed below for reference.    Significant Diagnostic Studies: DG Chest Port 1 View  Result Date: 08/12/2020 CLINICAL DATA:  Shortness of breath and cough for several weeks EXAM: PORTABLE CHEST 1 VIEW COMPARISON:  12/25/2010 FINDINGS: Cardiac shadow is at the upper limits of normal in size. Aortic calcifications are seen. Diffuse bilateral airspace opacities are noted consistent with multifocal pneumonia. No acute bony abnormality is seen. No sizable effusion is noted. TIPS shunt is noted in the right upper quadrant. IMPRESSION: Patchy multifocal pneumonia.  Correlate with COVID-19 testing. Electronically Signed   By: Inez Catalina M.D.   On: 08/12/2020 19:19   VAS [place] LOWER EXTREMITY VENOUS (DVT)  Result Date: 08/13/2020  Lower Venous DVTStudy Indications: Elevated d-dimer.  Comparison Study: No prior studies. Performing Technologist: Darlin Coco  Examination Guidelines: Neziah Vogelgesang complete evaluation includes B-mode imaging, spectral Doppler, color Doppler, and power Doppler as needed of all accessible portions of each vessel. Bilateral testing is considered an integral part of Lan Entsminger complete examination.  Limited examinations for reoccurring indications may be performed as noted. The reflux portion of the exam is performed with the patient in reverse Trendelenburg.  +---------+---------------+---------+-----------+----------+--------------+ RIGHT    CompressibilityPhasicitySpontaneityPropertiesThrombus Aging +---------+---------------+---------+-----------+----------+--------------+ CFV      Full           Yes      Yes                                 +---------+---------------+---------+-----------+----------+--------------+ SFJ      Full                                                        +---------+---------------+---------+-----------+----------+--------------+  FV Prox  Full                                                        +---------+---------------+---------+-----------+----------+--------------+ FV Mid   Full                                                        +---------+---------------+---------+-----------+----------+--------------+ FV DistalFull                                                        +---------+---------------+---------+-----------+----------+--------------+ PFV      Full                                                        +---------+---------------+---------+-----------+----------+--------------+ POP      Full           Yes      Yes                                 +---------+---------------+---------+-----------+----------+--------------+ PTV      Full                                                        +---------+---------------+---------+-----------+----------+--------------+ PERO     Full                                                        +---------+---------------+---------+-----------+----------+--------------+   +---------+---------------+---------+-----------+----------+--------------+ LEFT     CompressibilityPhasicitySpontaneityPropertiesThrombus Aging  +---------+---------------+---------+-----------+----------+--------------+ CFV      Full           Yes      Yes                                 +---------+---------------+---------+-----------+----------+--------------+ SFJ      Full                                                        +---------+---------------+---------+-----------+----------+--------------+ FV Prox  Full                                                        +---------+---------------+---------+-----------+----------+--------------+  FV Mid   Full                                                        +---------+---------------+---------+-----------+----------+--------------+ FV DistalFull                                                        +---------+---------------+---------+-----------+----------+--------------+ PFV      Full                                                        +---------+---------------+---------+-----------+----------+--------------+ POP      Full           Yes      Yes                                 +---------+---------------+---------+-----------+----------+--------------+ PTV      Full                                                        +---------+---------------+---------+-----------+----------+--------------+ PERO     Full                                                        +---------+---------------+---------+-----------+----------+--------------+     Summary: RIGHT: - There is no evidence of deep vein thrombosis in the lower extremity.  - No cystic structure found in the popliteal fossa.  LEFT: - There is no evidence of deep vein thrombosis in the lower extremity.  - No cystic structure found in the popliteal fossa.  *See table(s) above for measurements and observations. Electronically signed by Waverly Ferrari MD on 08/13/2020 at 4:47:42 PM.    Final    ECHOCARDIOGRAM LIMITED  Result Date: 08/16/2020    ECHOCARDIOGRAM LIMITED REPORT    Patient Name:   SHARMANE DAME Date of Exam: 08/16/2020 Medical Rec #:  [id number]       Height:       64.0 in Accession #:    [id number]      Weight:       227.5 lb Date of Birth:  December 24, 1950        BSA:          2.067 m Patient Age:    69 years        BP:           130/63 mmHg Patient Gender: F               HR:           67 bpm. Exam Location:  Inpatient Procedure: Limited Echo, Limited Color  Doppler and Cardiac Doppler Indications:    Elevated Brain Natriuretic Peptide level  History:        Patient has prior history of Echocardiogram examinations, most                 recent 04/09/2020. COVID-19.  Sonographer:    Mikki Santee RDCS (AE) Referring Phys: [phone] Patsy Zaragoza CALDWELL POWELL JR IMPRESSIONS  1. Left ventricular ejection fraction, by estimation, is 60 to 65%. The left ventricle has normal function. The left ventricle has no regional wall motion abnormalities. Left ventricular diastolic function could not be evaluated.  2. Right ventricular systolic function is normal. The right ventricular size is normal. There is normal pulmonary artery systolic pressure. The estimated right ventricular systolic pressure is 31.5 mmHg.  3. The mitral valve is degenerative. No evidence of mitral valve regurgitation. No evidence of mitral stenosis. Moderate mitral annular calcification.  4. The aortic valve is tricuspid. Aortic valve regurgitation is not visualized. No aortic stenosis is present.  5. The inferior vena cava is normal in size with greater than 50% respiratory variability, suggesting right atrial pressure of 3 mmHg. Comparison(s): No significant change from prior study. FINDINGS  Left Ventricle: Left ventricular ejection fraction, by estimation, is 60 to 65%. The left ventricle has normal function. The left ventricle has no regional wall motion abnormalities. The left ventricular internal cavity size was normal in size. There is  no left ventricular hypertrophy. Left ventricular diastolic function could not be  evaluated. Left ventricular diastolic function could not be evaluated due to mitral annular calcification (moderate or greater). Right Ventricle: The right ventricular size is normal. No increase in right ventricular wall thickness. Right ventricular systolic function is normal. There is normal pulmonary artery systolic pressure. The tricuspid regurgitant velocity is 2.02 m/s, and  with an assumed right atrial pressure of 3 mmHg, the estimated right ventricular systolic pressure is 40.0 mmHg. Left Atrium: Left atrial size was normal in size. Right Atrium: Right atrial size was normal in size. Pericardium: Trivial pericardial effusion is present. Mitral Valve: The mitral valve is degenerative in appearance. Moderate mitral annular calcification. No evidence of mitral valve stenosis. Tricuspid Valve: The tricuspid valve is grossly normal. Tricuspid valve regurgitation is mild . No evidence of tricuspid stenosis. Aortic Valve: The aortic valve is tricuspid. Aortic valve regurgitation is not visualized. No aortic stenosis is present. Pulmonic Valve: The pulmonic valve was grossly normal. Pulmonic valve regurgitation is trivial. No evidence of pulmonic stenosis. Aorta: The aortic root is normal in size and structure. Venous: The inferior vena cava is normal in size with greater than 50% respiratory variability, suggesting right atrial pressure of 3 mmHg. IAS/Shunts: The atrial septum is grossly normal. LEFT VENTRICLE PLAX 2D LVIDd:         5.20 cm  Diastology LVIDs:         3.00 cm  LV e' medial:    5.22 cm/s LV PW:         0.90 cm  LV E/e' medial:  22.6 LV IVS:        0.90 cm  LV e' lateral:   8.49 cm/s LVOT diam:     1.90 cm  LV E/e' lateral: 13.9 LVOT Area:     2.84 cm  LEFT ATRIUM             Index       RIGHT ATRIUM           Index LA diam:  2.60 cm 1.26 cm/m  RA Area:     18.20 cm LA Vol (A2C):   44.7 ml 21.63 ml/m RA Volume:   50.30 ml  24.34 ml/m LA Vol (A4C):   51.0 ml 24.68 ml/m LA Biplane Vol: 52.8  ml 25.55 ml/m   AORTA Ao Root diam: 2.90 cm MITRAL VALVE                TRICUSPID VALVE MV Area (PHT): 2.26 cm     TR Peak grad:   16.3 mmHg MV Decel Time: 335 msec     TR Vmax:        202.00 cm/s MV E velocity: 118.00 cm/s MV Melvinia Ashby velocity: 115.00 cm/s  SHUNTS MV E/Pavielle Biggar ratio:  1.03         Systemic Diam: 1.90 cm Eleonore Chiquito MD Electronically signed by Eleonore Chiquito MD Signature Date/Time: 08/16/2020/11:03:37 AM    Final     Microbiology: Recent Results (from the past 240 hour(s))  SARS Coronavirus 2 by RT PCR (hospital order, performed in [place] hospital lab) Nasopharyngeal Nasopharyngeal Swab     Status: Abnormal   Collection Time: 08/12/20  5:35 PM   Specimen: Nasopharyngeal Swab  Result Value Ref Range Status   SARS Coronavirus 2 POSITIVE (Tawona Filsinger) NEGATIVE Final    Comment: RESULT CALLED TO, READ BACK BY AND VERIFIED WITH: K MUNNETT RN 08/12/20 AT 2130 SK (NOTE) SARS-CoV-2 target nucleic acids are DETECTED  SARS-CoV-2 RNA is generally detectable in upper respiratory specimens  during the acute phase of infection.  Positive results are indicative  of the presence of the identified virus, but do not rule out bacterial infection or co-infection with other pathogens not detected by the test.  Clinical correlation with patient history and  other diagnostic information is necessary to determine patient infection status.  The expected result is negative.  Fact Sheet for Patients:   StrictlyIdeas.no   Fact Sheet for Healthcare Providers:   BankingDealers.co.za    This test is not yet approved or cleared by the [place] FDA and  has been authorized for detection and/or diagnosis of SARS-CoV-2 by FDA under an Emergency Use Authorization (EUA).  This EUA will remain in effect (meaning this test  can be used) for the duration of  the COVID-19 declaration under Section 564(b)(1) of the Act, 21 U.S.C. section 360-bbb-3(b)(1), unless the  authorization is terminated or revoked sooner.  Performed at [facility], [place] [address] [place], [place] [address]   Culture, blood (routine x 2)     Status: None (Preliminary result)   Collection Time: 08/12/20 11:02 PM   Specimen: BLOOD  Result Value Ref Range Status   Specimen Description BLOOD RIGHT WRIST  Final   Special Requests   Final    BOTTLES DRAWN AEROBIC AND ANAEROBIC Blood Culture adequate volume   Culture   Final    NO GROWTH 2 DAYS Performed at [facility], [place] [address] [place], [place] [address]    Report Status PENDING  Incomplete  Culture, blood (routine x 2)     Status: None (Preliminary result)   Collection Time: 08/13/20  1:30 AM   Specimen: BLOOD  Result Value Ref Range Status   Specimen Description BLOOD LEFT ANTECUBITAL  Final   Special Requests   Final    BOTTLES DRAWN AEROBIC AND ANAEROBIC Blood Culture results may not be optimal due to an excessive volume of blood received in culture bottles   Culture  Final    NO GROWTH 2 DAYS Performed at [facility], [place] [address], [place], [place] [address]    Report Status PENDING  Incomplete     Labs: Basic Metabolic Panel: Recent Labs  Lab 08/13/20 0445 08/14/20 0745 08/15/20 0600 08/16/20 1149 08/17/20 0135  NA 143 140 144 142 141  K 3.7 3.3* 3.8 3.9 3.6  CL 112* 107 109 108 106  CO2 19* 20* 21* 20* 24  GLUCOSE 105* 158* 114* 103* 184*  BUN 7* $Remo'13 17 21 21  'ypQvm$ CREATININE 0.72 0.77 0.77 0.79 0.77  CALCIUM 8.0* 7.9* 8.6* 8.5* 8.3*  MG 1.9  --   --   --   --    Liver Function Tests: Recent Labs  Lab 08/13/20 0445 08/14/20 0745 08/15/20 0600 08/16/20 1149 08/17/20 0135  AST 120* 95* 105* 109* 92*  ALT 37 36 41 47* 46*  ALKPHOS 292* 252* 278* 223* 213*  BILITOT 1.9* 1.4* 1.2 1.1 1.2  PROT 6.4* 6.3* 6.9 6.1* 6.0*  ALBUMIN 2.5* 2.3* 2.6* 2.4* 2.4*   Recent Labs  Lab 08/12/20 1715  LIPASE 27   No results for input(s): AMMONIA in the last 168 hours. CBC: Recent  Labs  Lab 08/13/20 0445 08/14/20 0745 08/15/20 0600 08/16/20 1149 08/17/20 0135  WBC 4.0 6.3 7.9 7.0 5.0  NEUTROABS 3.2 5.5 6.5 5.6 4.0  HGB 13.2 12.9 13.8 12.5 12.4  HCT 39.3 38.4 41.2 37.7 37.3  MCV 95.9 93.4 94.7 95.2 94.4  PLT 204 271 352 283 265   Cardiac Enzymes: No results for input(s): CKTOTAL, CKMB, CKMBINDEX, TROPONINI in the last 168 hours. BNP: BNP (last 3 results) Recent Labs    08/15/20 0600 08/16/20 1149 08/17/20 0135  BNP 111.6* 113.0* 79.0    ProBNP (last 3 results) No results for input(s): PROBNP in the last 8760 hours.  CBG: Recent Labs  Lab 08/16/20 1217 08/16/20 1626 08/16/20 2036 08/17/20 0728 08/17/20 1134  GLUCAP 95 167* 138* 115* 128*       Signed:  Fayrene Helper MD.  [facility] 08/17/2020, 12:37 PM

## 2020-08-17 NOTE — Progress Notes (Signed)
Pt discharged home.  Discharge instructions explained, pt verbalizes understanding. 

## 2020-08-17 NOTE — Discharge Instructions (Signed)
Person Under Monitoring Name: [name]  Location: [address] [place] [address]   Infection Prevention Recommendations for Individuals Confirmed to have, or Being Evaluated for, 2019 Novel Coronavirus (COVID-19) Infection Who Receive Care at Home  Individuals who are confirmed to have, or are being evaluated for, COVID-19 should follow the prevention steps below until [name] healthcare provider or local or state health department says they can return to normal activities.  Stay home except to get medical care You should restrict activities outside your home, except for getting medical care. Do not go to work, school, or public areas, and do not use public transportation or taxis.  Call ahead before visiting your doctor Before your medical appointment, call the healthcare provider and tell them that you have, or are being evaluated for, COVID-19 infection. This will help the healthcare provider's office take steps to keep other people from getting infected. Ask your healthcare provider to call the local or state health department.  Monitor your symptoms Seek prompt medical attention if your illness is worsening (e.g., difficulty breathing). Before going to your medical appointment, call the healthcare provider and tell them that you have, or are being evaluated for, COVID-19 infection. Ask your healthcare provider to call the local or state health department.  Wear Xoie Kreuser facemask You should wear [name] facemask that covers your nose and mouth when you are in the same room with other people and when you visit [name] healthcare provider. People who live with or visit you should also wear Lakedra Washington facemask while they are in the same room with you.  Separate yourself from other people in your home As much as possible, you should stay in [name] different room from other people in your home. Also, you should use [name] separate bathroom, if available.  Avoid sharing household items You should not  share dishes, drinking glasses, cups, eating utensils, towels, bedding, or other items with other people in your home. After using these items, you should wash them thoroughly with soap and water.  Cover your coughs and sneezes Cover your mouth and nose with [name] tissue when you cough or sneeze, or you can cough or sneeze into your sleeve. Throw used tissues in [name] lined trash can, and immediately wash your hands with soap and water for at least 20 seconds or use an alcohol-based hand rub.  Wash your [facility] your hands often and thoroughly with soap and water for at least 20 seconds. You can use an alcohol-based hand sanitizer if soap and water are not available and if your hands are not visibly dirty. Avoid touching your eyes, nose, and mouth with unwashed hands.   Prevention Steps for Caregivers and Household Members of Individuals Confirmed to have, or Being Evaluated for, COVID-19 Infection Being Cared for in the Home  If you live with, or provide care at home for, [name] person confirmed to have, or being evaluated for, COVID-19 infection please follow these guidelines to prevent infection:  Follow healthcare provider's instructions Make sure that you understand and can help the patient follow any healthcare provider instructions for all care.  Provide for the patient's basic needs You should help the patient with basic needs in the home and provide support for getting groceries, prescriptions, and other personal needs.  Monitor the patient's symptoms If they are getting sicker, call his or her medical provider and tell them that the patient has, or is being evaluated for, COVID-19 infection. This will help the healthcare provider's  office take steps to keep other people from getting infected. Ask the healthcare provider to call the local or state health department.  Limit the number of people who have contact with the patient  If possible, have only one caregiver for the  patient.  Other household members should stay in another home or place of residence. If this is not possible, they should stay  in another room, or be separated from the patient as much as possible. Use [name] separate bathroom, if available.  Restrict visitors who do not have an essential need to be in the home.  Keep older adults, very young children, and other sick people away from the patient Keep older adults, very young children, and those who have compromised immune systems or chronic health conditions away from the patient. This includes people with chronic heart, lung, or kidney conditions, diabetes, and cancer.  Ensure good ventilation Make sure that shared spaces in the home have good air flow, such as from an air conditioner or an opened window, weather permitting.  Wash your hands often  Wash your hands often and thoroughly with soap and water for at least 20 seconds. You can use an alcohol based hand sanitizer if soap and water are not available and if your hands are not visibly dirty.  Avoid touching your eyes, nose, and mouth with unwashed hands.  Use disposable paper towels to dry your hands. If not available, use dedicated cloth towels and replace them when they become wet.  Wear [name] facemask and gloves  Wear [name] disposable facemask at all times in the room and gloves when you touch or have contact with the patient's blood, body fluids, and/or secretions or excretions, such as sweat, saliva, sputum, nasal mucus, vomit, urine, or feces.  Ensure the mask fits over your nose and mouth tightly, and do not touch it during use.  Throw out disposable facemasks and gloves after using them. Do not reuse.  Wash your hands immediately after removing your facemask and gloves.  If your personal clothing becomes contaminated, carefully remove clothing and launder. Wash your hands after handling contaminated clothing.  Place all used disposable facemasks, gloves, and other waste in [name] lined  container before disposing them with other household waste.  Remove gloves and wash your hands immediately after handling these items.  Do not share dishes, glasses, or other household items with the patient  Avoid sharing household items. You should not share dishes, drinking glasses, cups, eating utensils, towels, bedding, or other items with Kallen Mccrystal patient who is confirmed to have, or being evaluated for, COVID-19 infection.  After the person uses these items, you should wash them thoroughly with soap and water.  Wash laundry thoroughly  Immediately remove and wash clothes or bedding that have blood, body fluids, and/or secretions or excretions, such as sweat, saliva, sputum, nasal mucus, vomit, urine, or feces, on them.  Wear gloves when handling laundry from the patient.  Read and follow directions on labels of laundry or clothing items and detergent. In general, wash and dry with the warmest temperatures recommended on the label.  Clean all areas the individual has used often  Clean all touchable surfaces, such as counters, tabletops, doorknobs, bathroom fixtures, toilets, phones, keyboards, tablets, and bedside tables, every day. Also, clean any surfaces that may have blood, body fluids, and/or secretions or excretions on them.  Wear gloves when cleaning surfaces the patient has come in contact with.  Use Trine Fread diluted bleach solution (e.g., dilute bleach with 1  part bleach and 10 parts water) or Maud Rubendall household disinfectant with Michelle Wnek label that says EPA-registered for coronaviruses. To make Ashlee Bewley bleach solution at home, add 1 tablespoon of bleach to 1 quart (4 cups) of water. For Kylie Gros larger supply, add  cup of bleach to 1 gallon (16 cups) of water.  Read labels of cleaning products and follow recommendations provided on product labels. Labels contain instructions for safe and effective use of the cleaning product including precautions you should take when applying the product, such as wearing gloves or  eye protection and making sure you have good ventilation during use of the product.  Remove gloves and wash hands immediately after cleaning.  Monitor yourself for signs and symptoms of illness Caregivers and household members are considered close contacts, should monitor their health, and will be asked to limit movement outside of the home to the extent possible. Follow the monitoring steps for close contacts listed on the symptom monitoring form.   ? If you have additional questions, contact your local health department or call the epidemiologist on call at [phone] (available 24/7). ? This guidance is subject to change. For the most up-to-date guidance from [facility], please refer to their website: [email or web address].pl

## 2020-08-18 LAB — CULTURE, BLOOD (ROUTINE X 2)
Culture: NO GROWTH
Culture: NO GROWTH
Special Requests: ADEQUATE

## 2020-08-19 ENCOUNTER — Telehealth: Payer: Self-pay

## 2020-08-19 NOTE — Telephone Encounter (Signed)
Transition Care Management Follow-up Telephone Call  Date of discharge and from where: 08/17/2020, [name]   How have you been since you were released from the hospital? Patient is doing very well she states. Still has some weakness with ambulating but is much better than before  Any questions or concerns? No  Items Reviewed:  Did the pt receive and understand the discharge instructions provided? Yes   Medications obtained and verified? Yes   Any new allergies since your discharge? No   Dietary orders reviewed? Yes  Do you have support at home? Yes   Functional Questionnaire: (I = Independent and D = Dependent) ADLs: I  Bathing/Dressing- I  Meal Prep- I  Eating- I  Maintaining continence- I  Transferring/Ambulation- I  Managing Meds- I  Follow up appointments reviewed:   PCP Hospital f/u appt confirmed? Yes  Scheduled to see Katherine Reamer, NP on 09/02/2020 @ 3 pm.  [facility] f/u appt confirmed? N/A   Are transportation arrangements needed? No   If their condition worsens, is the pt aware to call PCP or go to the Emergency Dept.? Yes  Was the patient provided with contact information for the PCP's office or ED? Yes  Was to pt encouraged to call back with questions or concerns? Yes

## 2020-08-19 NOTE — Telephone Encounter (Signed)
Called and spoke with patient's husband, Ozzie Hoyle. Patient has done well at home. Is walking a little every day, sleeping well, doing breathing exercises. Has hospital follow up appointment scheduled.

## 2020-08-19 NOTE — Telephone Encounter (Signed)
Noted  

## 2020-09-02 ENCOUNTER — Other Ambulatory Visit: Payer: Self-pay

## 2020-09-02 ENCOUNTER — Ambulatory Visit (INDEPENDENT_AMBULATORY_CARE_PROVIDER_SITE_OTHER): Payer: Medicare Other | Admitting: Family Medicine

## 2020-09-02 ENCOUNTER — Encounter: Payer: Self-pay | Admitting: Family Medicine

## 2020-09-02 ENCOUNTER — Ambulatory Visit (INDEPENDENT_AMBULATORY_CARE_PROVIDER_SITE_OTHER)
Admission: RE | Admit: 2020-09-02 | Discharge: 2020-09-02 | Disposition: A | Discharge status: Home / Self Care | Payer: Medicare Other | Source: Ambulatory Visit | Attending: Family Medicine | Admitting: Family Medicine

## 2020-09-02 VITALS — BP 122/64 | HR 75 | Temp 97.4°F | Ht 64.0 in | Wt 223.8 lb

## 2020-09-02 DIAGNOSIS — E039 Hypothyroidism, unspecified: Secondary | ICD-10-CM

## 2020-09-02 DIAGNOSIS — Z8701 Personal history of pneumonia (recurrent): Secondary | ICD-10-CM

## 2020-09-02 DIAGNOSIS — I10 Essential (primary) hypertension: Secondary | ICD-10-CM | POA: Diagnosis not present

## 2020-09-02 DIAGNOSIS — R7989 Other specified abnormal findings of blood chemistry: Secondary | ICD-10-CM

## 2020-09-02 DIAGNOSIS — Z6838 Body mass index (BMI) 38.0-38.9, adult: Secondary | ICD-10-CM

## 2020-09-02 NOTE — Patient Instructions (Signed)
Good to see you today  I recommend that you get your flu vaccine and Prevnar 13

## 2020-09-02 NOTE — Progress Notes (Signed)
Subjective:    Patient ID: [name], female    DOB: 1951-03-13, 69 y.o.   MRN: [id number]  HPI This is a 69 yo female, accompanied by her husband, who presents today for follow up of hospitalization. She was admitted 9/20-25/2021 with Covid 19 pneumonia.  She has significantly improved and plans to go back to work this week.  Energy level is improving.  She has been ambulating around her home and helping with meal preparation.  She is using incentive spirometer multiple times a day.  She is sleeping well.  Obesity-had been losing weight prior to hospitalization but has continued to lose weight.  She did not lose sense of taste or smell.  Appetite is good.  She and her husband have been eating at home exclusively due to her quarantine.  Hypertension-currently maintained on losartan 100 mg p.o. daily.  Review of Systems Denies chest pain, palpitations, SOB, wheeze, abdominal pain, diarrhea, constipation    Objective:   Physical Exam Vitals reviewed.  Constitutional:      General: She is not in acute distress.    Appearance: Normal appearance. She is obese. She is not ill-appearing, toxic-appearing or diaphoretic.  HENT:     Head: Normocephalic and atraumatic.  Cardiovascular:     Rate and Rhythm: Normal rate and regular rhythm.     Heart sounds: Normal heart sounds.  Pulmonary:     Effort: Pulmonary effort is normal.     Breath sounds: Normal breath sounds.  Skin:    General: Skin is warm and dry.  Neurological:     Mental Status: She is alert and oriented to person, place, and time.  Psychiatric:        Mood and Affect: Mood normal.        Behavior: Behavior normal.        Thought Content: Thought content normal.        Judgment: Judgment normal.       BP 122/64   Pulse 75   Temp (!) 97.4 F (36.3 C) (Temporal)   Ht 5\' 4"  (1.626 m)   Wt 223 lb 12 oz (101.5 kg)   SpO2 94%   BMI 38.41 kg/m  Wt Readings from Last 3 Encounters:  09/02/20 223 lb 12 oz (101.5 kg)   08/17/20 230 lb 6.1 oz (104.5 kg)  03/11/20 250 lb (113.4 kg)       Assessment & Plan:  1. History of pneumonia -Patient has previously declined annual flu as well as recommended pneumonia vaccines.  She is more open to it now following serious illness.  Discussed recommendation of annual flu shot and starting with Prevnar 13 pneumonia shot.  She is recently had high-dose steroids, would recommend she wait 2 weeks prior to getting these immunizations.  She was instructed that she could get them at her local pharmacy or schedule a nurse visit here for the office. - DG Chest 2 View; Future - CBC with Differential - Comprehensive metabolic panel  2. LFT elevation -Noted during hospitalization, likely due to acute illness, will recheck today - Comprehensive metabolic panel  3. Essential hypertension -Blood pressure well controlled, will continue to monitor as she loses weight to see if she can come off medication - Comprehensive metabolic panel  4. Class 2 severe obesity due to excess calories with serious comorbidity and body mass index (BMI) of 38.0 to 38.9 in adult [facility]) -Encouraged her to continue to make healthy food choices, walk as tolerated  5. Hypothyroidism, unspecified  type -Elevated TSH during hospitalization, will recheck in approximately 8 weeks  This visit occurred during the SARS-CoV-2 public health emergency.  Safety protocols were in place, including screening questions prior to the visit, additional usage of staff PPE, and extensive cleaning of exam room while observing appropriate contact time as indicated for disinfecting solutions.      Clarene Reamer, FNP-BC  [facility] at [facility], [facility]  09/02/2020 5:53 PM

## 2020-09-03 LAB — COMPREHENSIVE METABOLIC PANEL
ALT: 25 U/L (ref 0–35)
AST: 29 U/L (ref 0–37)
Albumin: 3.2 g/dL — ABNORMAL LOW (ref 3.5–5.2)
Alkaline Phosphatase: 164 U/L — ABNORMAL HIGH (ref 39–117)
BUN: 11 mg/dL (ref 6–23)
CO2: 29 mEq/L (ref 19–32)
Calcium: 9.1 mg/dL (ref 8.4–10.5)
Chloride: 106 mEq/L (ref 96–112)
Creatinine, Ser: 0.75 mg/dL (ref 0.40–1.20)
GFR: 81.18 mL/min (ref >60.00)
Glucose, Bld: 100 mg/dL — ABNORMAL HIGH (ref 70–99)
Potassium: 4.4 mEq/L (ref 3.5–5.1)
Sodium: 141 mEq/L (ref 135–145)
Total Bilirubin: 0.9 mg/dL (ref 0.2–1.2)
Total Protein: 6.4 g/dL (ref 6.0–8.3)

## 2020-09-03 LAB — CBC WITH DIFFERENTIAL/PLATELET
Basophils Absolute: 0 10*3/uL (ref 0.0–0.1)
Basophils Relative: 0.5 % (ref 0.0–3.0)
Eosinophils Absolute: 0.2 10*3/uL (ref 0.0–0.7)
Eosinophils Relative: 3.5 % (ref 0.0–5.0)
HCT: 35.2 % — ABNORMAL LOW (ref 36.0–46.0)
Hemoglobin: 11.8 g/dL — ABNORMAL LOW (ref 12.0–15.0)
Lymphocytes Relative: 14.7 % (ref 12.0–46.0)
Lymphs Abs: 0.7 10*3/uL (ref 0.7–4.0)
MCHC: 33.5 g/dL (ref 30.0–36.0)
MCV: 98.9 fl (ref 78.0–100.0)
Monocytes Absolute: 0.6 10*3/uL (ref 0.1–1.0)
Monocytes Relative: 11.4 % (ref 3.0–12.0)
Neutro Abs: 3.5 10*3/uL (ref 1.4–7.7)
Neutrophils Relative %: 69.9 % (ref 43.0–77.0)
Platelets: 145 10*3/uL — ABNORMAL LOW (ref 150.0–400.0)
RBC: 3.56 Mil/uL — ABNORMAL LOW (ref 3.87–5.11)
RDW: 16.6 % — ABNORMAL HIGH (ref 11.5–15.5)
WBC: 4.9 10*3/uL (ref 4.0–10.5)

## 2020-09-13 ENCOUNTER — Other Ambulatory Visit: Payer: Self-pay | Admitting: Family Medicine

## 2020-09-13 DIAGNOSIS — S82143A Displaced bicondylar fracture of unspecified tibia, initial encounter for closed fracture: Secondary | ICD-10-CM | POA: Diagnosis not present

## 2020-09-13 DIAGNOSIS — I10 Essential (primary) hypertension: Secondary | ICD-10-CM

## 2020-10-02 ENCOUNTER — Telehealth: Payer: Self-pay

## 2020-10-02 NOTE — Telephone Encounter (Signed)
[facility] - Client TELEPHONE ADVICE RECORD Access[facility]urse Patient Name: [name] Gender: Female DOB: [birth year] Age: 69 Y 78 M 3 D Return Phone Number: [phone] (Primary) Address: City/State/Zip: [place] [address] Client [name] - Client Client Site [place] - Day Physician Tor Netters- NP Contact Type Call Who Is Calling Patient / Member / Family / Caregiver Call Type Triage / Clinical Relationship To Patient Self Return Phone Number [phone] (Primary) Chief Complaint Nasal Congestion Reason for Call Symptomatic / Request for [place] states she was in the hospital for covid, has sinus issues again, coughing, rattling in chest, and wondering what to do. Translation No Nurse Assessment Nurse: Gildardo Pounds, RN, Amy Date/Time (Eastern Time): 10/02/2020 11:22:33 AM Confirm and document reason for call. If symptomatic, describe symptoms. ---Caller states she was in the hospital for Covid Sept. 20th. She had pneumonia while she was there. She was discharged the 25th. She has sinus issues again with coughing & rattling in her chest. It started Monday with laryngitis & runny nose. No fever. Does the patient have any new or worsening symptoms? ---Yes Will a triage be completed? ---Yes Related visit to physician within the last 2 weeks? ---No Does the PT have any chronic conditions? (i.e. diabetes, asthma, this includes High risk factors for pregnancy, etc.) ---No Is this a behavioral health or substance abuse call? ---No Guidelines Guideline Title Affirmed Question Affirmed Notes Nurse Date/Time Eilene Ghazi Time) Sinus Pain or Congestion Lots of coughing Lovelace, RN, Amy 10/02/2020 11:24:15 AM Disp. Time Eilene Ghazi Time) Disposition Final User 10/02/2020 11:08:39 AM Attempt made - message left Lovelace, RN, Amy 10/02/2020 11:30:18 AM SEE PCP WITHIN 3 DAYS Yes  Lovelace, RN, Amy Caller Disagree/Comply Comply Caller Understands Yes PreDisposition InappropriateToAsk PLEASE NOTE: All timestamps contained within this report are represented as [place] Standard Time. CONFIDENTIALTY NOTICE: This fax transmission is intended only for the addressee. It contains information that is legally privileged, confidential or otherwise protected from use or disclosure. If you are not the intended recipient, you are strictly prohibited from reviewing, disclosing, copying using or disseminating any of this information or taking any action in reliance on or regarding this information. If you have received this fax in error, please notify us immediately by telephone so that we can arrange for its return to [place]. Phone: [phone], Toll-Free: [phone], Fax: [phone] Page: 2 of 2 Call Id: [id number] Care Advice Given Per Guideline SEE PCP WITHIN 3 DAYS: * You need to be seen within 2 or 3 days. * Introduction: Saline (salt water) nasal irrigation (nasal wash) is an effective and simple home remedy for treating stuffy nose and sinus congestion. The nose can be irrigated by pouring, spraying, or squirting salt water into the nose and then letting it run back out. NASAL WASHES FOR A STUFFY NOSE: * How it Helps: The salt water rinses out excess mucus and washes out any irritants (dust, allergens) that might be present. It also moistens the nasal cavity. * Methods: There are several ways to irrigate the nose. You can use a saline nasal spray bottle (available overthe- counter), a rubber ear syringe, a medical syringe without the needle, or a NETI POT. NASAL DECONGESTANTS FOR A VERY STUFFY NOSE: CALL BACK IF: * Difficulty breathing (and not relieved by cleaning out nose) * You become worse CARE ADVICE given per Sinus Pain or Congestion (Adult) guideline. COUGH MEDICINES: * HOME REMEDY - HONEY: This old home  remedy has been shown to help decrease coughing at night. The adult  dosage is 2 teaspoons (10 ml) at bedtime. Honey should not be given to infants under one year of age. * OTC COUGH SYRUPS: The most common cough suppressant in OTC cough medications is dextromethorphan. Often the letters 'DM' appear in the name. * PCP VISIT: Call your doctor (or NP/PA) during regular office hours and make an appointment. A clinic or urgent care center are good places to go for care if your doctor's office is closed or you can't get an appointment. NOTE: If office will be open tomorrow, tell caller to call then, not in 3 days. * If you have a very stuffy nose, nasal decongestant medicines can shrink the swollen nasal mucosa and allow for easier breathing. If you have a very runny nose, these medicines can reduce the amount of drainage. They may be taken as pills by mouth or as a nasal spray. * Pseudoephedrine (Sudafed): Available over-the-counter in pill form. Typical adult dosage is two 30 mg tablets every 6 hours. * Oxymetazoline Nasal Drops (Afrin in [place]; Drixoral in [place]): Available over-the-counter. Clean out the nose before using. Spray each nostril once, wait one minute for absorption, and then spray a second time. * Phenylephrine Nasal Drops (Neo-Synephrine): Available over-the-counter. Clean out the nose before using. Spray each nostril once, wait one minute for absorption, and then spray a second time. Comments User: Katherine Sever, RN Date/Time Eilene Ghazi Time): 10/02/2020 11:31:09 AM Warm transferred caller to Shirlean Mylar at the office to make an appt. Referrals Warm transfer to backline

## 2020-10-02 NOTE — Telephone Encounter (Signed)
Pt has been having sinus issues; pt has also had dry cough and pt having a lot of chest congestion and pt said when she is not coughing she can hear rattling in her chest.pt has runny nose and no fever. Pt sounds very congested while talking on phone. Pt is going to [facility] on [address] for eval and possible testing. Pt had HFU for pneumonia on 09/02/20. FYI to Glenda Chroman FNP.

## 2020-10-03 NOTE — Telephone Encounter (Signed)
Called pt and she stated that she decided not to got to UC.  Pt stated last night (Wednsday 10/02/2020), she had a good night, not a lot of coughing. Pt stated on Monday night she had laryngitis and a sinus congestion. Tuesday night she didn't sleep as much due to coughing but again  on Wednesday night not a lot of coughing. Mychart video visit scheduled with Debbie on tomorrow.

## 2020-10-03 NOTE — Telephone Encounter (Signed)
I do not see where patient went to urgent care. Please call and check on her.

## 2020-10-04 ENCOUNTER — Telehealth (INDEPENDENT_AMBULATORY_CARE_PROVIDER_SITE_OTHER): Payer: Medicare Other | Admitting: Family Medicine

## 2020-10-04 ENCOUNTER — Encounter: Payer: Self-pay | Admitting: Family Medicine

## 2020-10-04 VITALS — Temp 97.7°F | Ht 64.0 in | Wt 220.0 lb

## 2020-10-04 DIAGNOSIS — J069 Acute upper respiratory infection, unspecified: Secondary | ICD-10-CM

## 2020-10-04 NOTE — Progress Notes (Signed)
Virtual Visit via Video Note  I connected with [name] on 10/04/20 at 11:00 AM EST by a video enabled telemedicine application and verified that I am speaking with the correct person using two identifiers.  Location: Patient: At work Provider: [place] Persons participating in virtual visit: Patient, provider   I discussed the limitations of evaluation and management by telemedicine and the availability of in person appointments. The patient expressed understanding and agreed to proceed.  History of Present Illness: Chief Complaint  Patient presents with  . Cough   This is a 69 year old female who presents today with above chief complaint. She. has a past medical history of Cancer ([place]), Elevated liver function tests, Esophageal varices ([place]), Hypothyroidism, Iron deficiency anemia, Liver disease, Malignant neoplasm of cecum ([place]), Portal hypertension ([place]), and Splenomegaly.  She reports a 6-day history of cough, nasal congestion, runny nose.  She initially had some body aches but these have resolved.  She noticed some gravel which seemed worse at night and this concerned her as she had Covid pneumonia with hospitalization recently.  She has not had any sputum production, no fever, no shortness of breath, no wheeze.  She is feeling much better over the last 2 days.  Her nasal drainage is clear to white and frequency of this has decreased.  She has been using a diffuser at nighttime with aromatherapy shield.  She is also been taking also over-the-counter supplement Ossisidium.   She works in an [employer] and does not wear a mask.  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  She reports that there have been a lot of colds going around among the students.  Observations/Objective: Patient is alert and answers questions appropriately.  She sounds mildly congested.  Respirations are even and unlabored and she is  normally conversive without increased work of breathing with normal conversation.  No audible wheeze or witnessed cough.  Mood and affect are appropriate. Temp 97.7 F (36.5 C) (Temporal)   Ht 5\' 4"  (1.626 m)   Wt 220 lb (99.8 kg)   BMI 37.76 kg/m  Wt Readings from Last 3 Encounters:  10/04/20 220 lb (99.8 kg)  09/02/20 223 lb 12 oz (101.5 kg)  08/17/20 230 lb 6.1 oz (104.5 kg)    Assessment and Plan: 1. Viral URI with cough -Low suspicion for COVID-19 as she was +2 months ago -Suspect viral URI, discussed diagnosis and symptomatic treatment -Reviewed 911 precautions, follow-up recommendations   Clarene Reamer, FNP-BC  [facility] at [facility], [place]  10/04/2020 10:33 AM   Follow Up Instructions:    I discussed the assessment and treatment plan with the patient. The patient was provided an opportunity to ask questions and all were answered. The patient agreed with the plan and demonstrated an understanding of the instructions.   The patient was advised to call back or seek an in-person evaluation if the symptoms worsen or if the condition fails to improve as anticipated.   Elby Beck, FNP

## 2020-10-14 DIAGNOSIS — S82143A Displaced bicondylar fracture of unspecified tibia, initial encounter for closed fracture: Secondary | ICD-10-CM | POA: Diagnosis not present

## 2020-11-04 ENCOUNTER — Encounter: Payer: Self-pay | Admitting: Family Medicine

## 2020-11-13 DIAGNOSIS — S82143A Displaced bicondylar fracture of unspecified tibia, initial encounter for closed fracture: Secondary | ICD-10-CM | POA: Diagnosis not present

## 2020-11-19 DIAGNOSIS — H2513 Age-related nuclear cataract, bilateral: Secondary | ICD-10-CM | POA: Diagnosis not present

## 2020-11-27 ENCOUNTER — Emergency Department (HOSPITAL_COMMUNITY): Payer: Medicare Other

## 2020-11-27 ENCOUNTER — Telehealth: Payer: Self-pay | Admitting: *Deleted

## 2020-11-27 ENCOUNTER — Encounter (HOSPITAL_COMMUNITY): Payer: Self-pay | Admitting: *Deleted

## 2020-11-27 ENCOUNTER — Inpatient Hospital Stay (HOSPITAL_COMMUNITY)
Admission: EM | Admit: 2020-11-27 | Discharge: 2020-11-30 | DRG: 433 | Disposition: A | Discharge status: Home / Self Care | Payer: Medicare Other | Attending: Internal Medicine | Admitting: Internal Medicine

## 2020-11-27 ENCOUNTER — Other Ambulatory Visit: Payer: Self-pay

## 2020-11-27 DIAGNOSIS — K5900 Constipation, unspecified: Secondary | ICD-10-CM | POA: Diagnosis not present

## 2020-11-27 DIAGNOSIS — D509 Iron deficiency anemia, unspecified: Secondary | ICD-10-CM | POA: Diagnosis present

## 2020-11-27 DIAGNOSIS — K7290 Hepatic failure, unspecified without coma: Secondary | ICD-10-CM | POA: Diagnosis present

## 2020-11-27 DIAGNOSIS — D731 Hypersplenism: Secondary | ICD-10-CM | POA: Diagnosis not present

## 2020-11-27 DIAGNOSIS — K8020 Calculus of gallbladder without cholecystitis without obstruction: Secondary | ICD-10-CM | POA: Diagnosis not present

## 2020-11-27 DIAGNOSIS — E039 Hypothyroidism, unspecified: Secondary | ICD-10-CM | POA: Diagnosis not present

## 2020-11-27 DIAGNOSIS — Z20822 Contact with and (suspected) exposure to covid-19: Secondary | ICD-10-CM | POA: Diagnosis present

## 2020-11-27 DIAGNOSIS — R0602 Shortness of breath: Secondary | ICD-10-CM | POA: Diagnosis not present

## 2020-11-27 DIAGNOSIS — Z79890 Hormone replacement therapy (postmenopausal): Secondary | ICD-10-CM | POA: Diagnosis not present

## 2020-11-27 DIAGNOSIS — I10 Essential (primary) hypertension: Secondary | ICD-10-CM | POA: Diagnosis not present

## 2020-11-27 DIAGNOSIS — Z6836 Body mass index (BMI) 36.0-36.9, adult: Secondary | ICD-10-CM | POA: Diagnosis not present

## 2020-11-27 DIAGNOSIS — K7460 Unspecified cirrhosis of liver: Secondary | ICD-10-CM | POA: Diagnosis not present

## 2020-11-27 DIAGNOSIS — T68XXXA Hypothermia, initial encounter: Secondary | ICD-10-CM | POA: Diagnosis present

## 2020-11-27 DIAGNOSIS — Z8616 Personal history of COVID-19: Secondary | ICD-10-CM

## 2020-11-27 DIAGNOSIS — Z833 Family history of diabetes mellitus: Secondary | ICD-10-CM

## 2020-11-27 DIAGNOSIS — D61818 Other pancytopenia: Secondary | ICD-10-CM | POA: Diagnosis present

## 2020-11-27 DIAGNOSIS — Z8261 Family history of arthritis: Secondary | ICD-10-CM | POA: Diagnosis not present

## 2020-11-27 DIAGNOSIS — J323 Chronic sphenoidal sinusitis: Secondary | ICD-10-CM | POA: Diagnosis not present

## 2020-11-27 DIAGNOSIS — Z79899 Other long term (current) drug therapy: Secondary | ICD-10-CM

## 2020-11-27 DIAGNOSIS — R4020 Unspecified coma: Secondary | ICD-10-CM | POA: Diagnosis not present

## 2020-11-27 DIAGNOSIS — K7200 Acute and subacute hepatic failure without coma: Secondary | ICD-10-CM

## 2020-11-27 DIAGNOSIS — R4182 Altered mental status, unspecified: Secondary | ICD-10-CM | POA: Diagnosis present

## 2020-11-27 DIAGNOSIS — Z8249 Family history of ischemic heart disease and other diseases of the circulatory system: Secondary | ICD-10-CM | POA: Diagnosis not present

## 2020-11-27 DIAGNOSIS — I8500 Esophageal varices without bleeding: Secondary | ICD-10-CM | POA: Diagnosis present

## 2020-11-27 DIAGNOSIS — Z85828 Personal history of other malignant neoplasm of skin: Secondary | ICD-10-CM

## 2020-11-27 DIAGNOSIS — Z85038 Personal history of other malignant neoplasm of large intestine: Secondary | ICD-10-CM

## 2020-11-27 DIAGNOSIS — Z91041 Radiographic dye allergy status: Secondary | ICD-10-CM

## 2020-11-27 DIAGNOSIS — J0130 Acute sphenoidal sinusitis, unspecified: Secondary | ICD-10-CM | POA: Diagnosis not present

## 2020-11-27 DIAGNOSIS — K766 Portal hypertension: Secondary | ICD-10-CM | POA: Diagnosis present

## 2020-11-27 DIAGNOSIS — K7682 Hepatic encephalopathy: Secondary | ICD-10-CM

## 2020-11-27 DIAGNOSIS — R413 Other amnesia: Secondary | ICD-10-CM

## 2020-11-27 LAB — CBC
HCT: 39.1 % (ref 36.0–46.0)
Hemoglobin: 12.6 g/dL (ref 12.0–15.0)
MCH: 31.8 pg (ref 26.0–34.0)
MCHC: 32.2 g/dL (ref 30.0–36.0)
MCV: 98.7 fL (ref 80.0–100.0)
Platelets: 104 10*3/uL — ABNORMAL LOW (ref 150–400)
RBC: 3.96 MIL/uL (ref 3.87–5.11)
RDW: 17.2 % — ABNORMAL HIGH (ref 11.5–15.5)
WBC: 3.3 10*3/uL — ABNORMAL LOW (ref 4.0–10.5)
nRBC: 0 % (ref 0.0–0.2)

## 2020-11-27 LAB — COMPREHENSIVE METABOLIC PANEL
ALT: 58 U/L — ABNORMAL HIGH (ref 0–44)
AST: 96 U/L — ABNORMAL HIGH (ref 15–41)
Albumin: 3.6 g/dL (ref 3.5–5.0)
Alkaline Phosphatase: 172 U/L — ABNORMAL HIGH (ref 38–126)
Anion gap: 13 (ref 5–15)
BUN: 22 mg/dL (ref 8–23)
CO2: 24 mmol/L (ref 22–32)
Calcium: 10.3 mg/dL (ref 8.9–10.3)
Chloride: 101 mmol/L (ref 98–111)
Creatinine, Ser: 0.9 mg/dL (ref 0.44–1.00)
GFR, Estimated: >60 mL/min (ref >60)
Glucose, Bld: 89 mg/dL (ref 70–99)
Potassium: 3.9 mmol/L (ref 3.5–5.1)
Sodium: 138 mmol/L (ref 135–145)
Total Bilirubin: 1.6 mg/dL — ABNORMAL HIGH (ref 0.3–1.2)
Total Protein: 7.2 g/dL (ref 6.5–8.1)

## 2020-11-27 NOTE — ED Triage Notes (Signed)
The pt has ams  Since September getting worse and she has a tremor

## 2020-11-27 NOTE — Telephone Encounter (Signed)
Would recommend patient schedule acute visit with myself or any provider available

## 2020-11-27 NOTE — Telephone Encounter (Signed)
Patient's husband  [name] (on [place]) called stating that he has concerns about his wife. Patient's husband stated that his wife had covid back in September. [name] stated after his wife came home from the hospital she was having shaky spells and slurred speech off and on. [name] stated that his wife got better, but then the first of December she started back with shaky spells and slurred speech off and on. Patient's husband stated that she started back to work two days ago and when she comes home from work she is exhausted. Patient's husband stated that she came home from work Monday and slept for 3 hours. Patient's husband stated that he has real concerns about his wife because her memory is not what it use to be and she is forgetting a lot. Patient's husband stated that he is wondering if all of this is from covid or if this could be related to her thyroid. Patient scheduled for a TOC and CPE appointment with Dr. Selena Batten 01/30/21. Patient's husband wants to know if Dr. Selena Batten can see her sooner or order some lab work? ER precautions were given to patient's husband and he verbalized understanding. Patient's husband denies that his wife has any current covid symptoms.

## 2020-11-27 NOTE — ED Triage Notes (Signed)
ams after being in the hospital for the covid virus

## 2020-11-28 ENCOUNTER — Emergency Department (HOSPITAL_COMMUNITY): Payer: Medicare Other

## 2020-11-28 ENCOUNTER — Telehealth: Payer: Self-pay

## 2020-11-28 DIAGNOSIS — K8020 Calculus of gallbladder without cholecystitis without obstruction: Secondary | ICD-10-CM | POA: Diagnosis not present

## 2020-11-28 DIAGNOSIS — R4182 Altered mental status, unspecified: Secondary | ICD-10-CM | POA: Diagnosis present

## 2020-11-28 LAB — URINALYSIS, ROUTINE W REFLEX MICROSCOPIC
Bilirubin Urine: NEGATIVE
Glucose, UA: NEGATIVE mg/dL
Hgb urine dipstick: NEGATIVE
Ketones, ur: NEGATIVE mg/dL
Leukocytes,Ua: NEGATIVE
Nitrite: NEGATIVE
Protein, ur: NEGATIVE mg/dL
Specific Gravity, Urine: 1.019 (ref 1.005–1.030)
pH: 5 (ref 5.0–8.0)

## 2020-11-28 LAB — SARS CORONAVIRUS 2 (TAT 6-24 HRS): SARS Coronavirus 2: NEGATIVE

## 2020-11-28 LAB — AMMONIA: Ammonia: 41 umol/L — ABNORMAL HIGH (ref 9–35)

## 2020-11-28 LAB — TSH: TSH: 0.574 u[IU]/mL (ref 0.350–4.500)

## 2020-11-28 LAB — LACTIC ACID, PLASMA: Lactic Acid, Venous: 1.2 mmol/L (ref 0.5–1.9)

## 2020-11-28 MED ORDER — LOSARTAN POTASSIUM 50 MG PO TABS
100.0000 mg | ORAL_TABLET | Freq: Every day | ORAL | Status: DC
  Administered 2020-11-29 – 2020-11-30 (×2): 100 mg via ORAL
  Filled 2020-11-28 (×2): qty 2

## 2020-11-28 MED ORDER — ENOXAPARIN SODIUM 40 MG/0.4ML ~~LOC~~ SOLN: 40.0000 mg | SUBCUTANEOUS | Status: DC

## 2020-11-28 MED ORDER — ONDANSETRON HCL 4 MG/2ML IJ SOLN: 4.0000 mg | Freq: Four times a day (QID) | INTRAMUSCULAR | Status: DC | PRN

## 2020-11-28 MED ORDER — VITAMIN D3 50 MCG (2000 UT) PO TABS: 2000.0000 [IU] | ORAL_TABLET | Freq: Every day | ORAL | Status: DC

## 2020-11-28 MED ORDER — ONDANSETRON HCL 4 MG PO TABS: 4.0000 mg | ORAL_TABLET | Freq: Four times a day (QID) | ORAL | Status: DC | PRN

## 2020-11-28 MED ORDER — LACTULOSE 10 GM/15ML PO SOLN
10.0000 g | Freq: Two times a day (BID) | ORAL | Status: DC
  Administered 2020-11-28 – 2020-11-29 (×2): 10 g via ORAL
  Filled 2020-11-28 (×2): qty 30

## 2020-11-28 MED ORDER — ZINC GLUCONATE 50 MG PO TABS: 50.0000 mg | ORAL_TABLET | Freq: Every day | ORAL | Status: DC

## 2020-11-28 MED ORDER — ASCORBIC ACID 500 MG PO TABS
500.0000 mg | ORAL_TABLET | Freq: Every day | ORAL | Status: DC
  Administered 2020-11-28 – 2020-11-29 (×2): 500 mg via ORAL
  Filled 2020-11-28 (×3): qty 1

## 2020-11-28 MED ORDER — ZINC SULFATE 220 (50 ZN) MG PO CAPS
220.0000 mg | ORAL_CAPSULE | Freq: Every day | ORAL | Status: DC
  Administered 2020-11-28 – 2020-11-29 (×2): 220 mg via ORAL
  Filled 2020-11-28 (×2): qty 1

## 2020-11-28 MED ORDER — LEVOTHYROXINE SODIUM 88 MCG PO TABS
88.0000 ug | ORAL_TABLET | Freq: Every day | ORAL | Status: DC
  Administered 2020-11-29: 88 ug via ORAL
  Filled 2020-11-28: qty 1

## 2020-11-28 MED ORDER — ACETAMINOPHEN 650 MG RE SUPP: 650.0000 mg | Freq: Four times a day (QID) | RECTAL | Status: DC | PRN

## 2020-11-28 MED ORDER — ACETAMINOPHEN 325 MG PO TABS: 650.0000 mg | ORAL_TABLET | Freq: Four times a day (QID) | ORAL | Status: DC | PRN

## 2020-11-28 MED ORDER — ENOXAPARIN SODIUM 40 MG/0.4ML ~~LOC~~ SOLN
40.0000 mg | SUBCUTANEOUS | Status: DC
  Administered 2020-11-29: 40 mg via SUBCUTANEOUS
  Filled 2020-11-28: qty 0.4

## 2020-11-28 MED ORDER — VITAMIN D 25 MCG (1000 UNIT) PO TABS
2000.0000 [IU] | ORAL_TABLET | Freq: Every day | ORAL | Status: DC
  Administered 2020-11-28 – 2020-11-29 (×2): 2000 [IU] via ORAL
  Filled 2020-11-28 (×2): qty 2

## 2020-11-28 NOTE — ED Notes (Signed)
Oral temp rechecked by EMT Mikle Bosworth, advised oral temp 92 in lobby, pt brought to triage for rectal temp check which was found to be 93.1. pt moved back to room to be seen by provider.

## 2020-11-28 NOTE — H&P (Addendum)
History and Physical    [name] [id number] DOB: 26-Jan-1951 DOA: 11/27/2020  PCP: Elby Beck, FNP (Inactive)   Patient coming from: Home  I have personally briefly reviewed patient's old medical records in [place]  Chief Complaint: Altered mental status  HPI: [name] is a 70 y.o. female with medical history significant of stage IIIb colon cancer s/p surgery and 1 year of FOLFOX treatment in 2004, liver cirrhosis secondary to oxaliplatin s/p TIPS in 2012 at [facility], chronic pancytopenia due to hypersplenism came to ED with some concern of confusion at home. Confusion improved by the time she was seen after a long waiting time but husband was concerned and keep for 1 night.  Patient was alert and oriented, denies any nausea, vomiting or diarrhea.  No fever or chills, no chest pain, dyspnea or upper respiratory symptoms.  No sick contacts.  No recent change in her appetite or bowel habits.  Patient normally have BM every other day.  No urinary symptoms.  ED Course: Hemodynamically stable, did develop hypothermia while waiting in ED which resolved with bear hugger, WBC of 3.3, platelet of 104, ammonia of 41, rest of the labs were unremarkable, Covid testing pending.  Liver ultrasound with liver cirrhosis and cholelithiasis without any evidence of cholecystitis.  Review of Systems: As per HPI otherwise 10 point review of systems negative.   Past Medical History:  Diagnosis Date  . Cancer ([place])    cecum  . Elevated liver function tests   . Esophageal varices ([place])   . Hypothyroidism   . Iron deficiency anemia   . Liver disease    chemotherapy complication, per pt, shunts placed to bypass liver  . Malignant neoplasm of cecum ([place])   . Portal hypertension ([place])   . Splenomegaly     Past Surgical History:  Procedure Laterality Date  . ESOPHAGEAL VARICE LIGATION    . HEMICOLECTOMY  01/08/03  . LIVER SURGERY     shunts placed after chemo complication  .  SKIN FULL THICKNESS GRAFT N/A 09/12/2019   Procedure: debridement and FTSG to the nose from left upper arm;  Surgeon: Cindra Presume, MD;  Location: [facility];  Service: Plastics;  Laterality: N/A;  2 hours, please     reports that she has never smoked. She has never used smokeless tobacco. She reports that she does not drink alcohol and does not use drugs.  Allergies  Allergen Reactions  . Contrast Media [Iodinated Diagnostic Agents] Hives    Family History  Problem Relation Age of Onset  . Arthritis Mother   . Hearing loss Mother   . Heart disease Mother   . Hypertension Mother   . Arthritis Father   . Diabetes Father   . Heart disease Father   . Breast cancer Neg Hx     Prior to Admission medications   Medication Sig Start Date End Date Taking? Authorizing Provider  Cholecalciferol (VITAMIN D3) 50 MCG (2000 UT) TABS Take 2,000 Units by mouth at bedtime.    Yes [provider]  Elderberry 575 MG/5ML SYRP Take 15 mLs by mouth at bedtime.   Yes [provider]  losartan (COZAAR) 100 MG tablet TAKE 1 TABLET BY MOUTH EVERY DAY Patient taking differently: Take 100 mg by mouth daily. 09/16/20  Yes Elby Beck, FNP  SYNTHROID 88 MCG tablet Take 1 tablet (88 mcg total) by mouth daily before breakfast. 08/04/20  Yes Elby Beck, FNP  vitamin  C (ASCORBIC ACID) 500 MG tablet Take 500 mg by mouth at bedtime.   Yes [provider]  zinc gluconate 50 MG tablet Take 50 mg by mouth at bedtime.   Yes [provider]    Physical Exam: Vitals:   11/28/20 1530 11/28/20 1714 11/28/20 1802 11/28/20 1845  BP: (!) 97/54   (!) 119/48  Pulse: 67   86  Resp:    10  Temp:   (!) 97.5 F (36.4 C)   TempSrc:   Oral   SpO2: 96% 98%  96%  Weight:      Height:        General: Vital signs reviewed.  Patient is well-developed and well-nourished, in no acute distress and cooperative with exam.  Head: Normocephalic and atraumatic. Eyes:  EOMI, conjunctivae normal, no scleral icterus.  ENMT: Mucous membranes are moist. Posterior pharynx clear of any exudate or lesions.Normal dentition.  Neck: Supple, trachea midline, normal ROM,  Cardiovascular: RRR, S1 normal, S2 normal, no murmurs, gallops, or rubs. Pulmonary/Chest: Clear to auscultation bilaterally, no wheezes, rales, or rhonchi. Abdominal: Soft, non-tender, non-distended, BS +, Extremities: No lower extremity edema bilaterally,  pulses symmetric and intact bilaterally. No cyanosis or clubbing. Neurological: A&O x3, Strength is normal and symmetric bilaterally, cranial nerve II-XII are grossly intact, no focal motor deficit, sensory intact to light touch bilaterally.  Psychiatric: Normal mood and affect. speech and behavior is normal.  Labs on Admission: I have personally reviewed following labs and imaging studies  CBC: Recent Labs  Lab 11/27/20 2057  WBC 3.3*  HGB 12.6  HCT 39.1  MCV 98.7  PLT 104*   Basic Metabolic Panel: Recent Labs  Lab 11/27/20 2057  NA 138  K 3.9  CL 101  CO2 24  GLUCOSE 89  BUN 22  CREATININE 0.90  CALCIUM 10.3   GFR: Estimated Creatinine Clearance: 66 mL/min (by C-G formula based on SCr of 0.9 mg/dL). Liver Function Tests: Recent Labs  Lab 11/27/20 2057  AST 96*  ALT 58*  ALKPHOS 172*  BILITOT 1.6*  PROT 7.2  ALBUMIN 3.6   No results for input(s): LIPASE, AMYLASE in the last 168 hours. Recent Labs  Lab 11/28/20 1040  AMMONIA 41*   Coagulation Profile: No results for input(s): INR, PROTIME in the last 168 hours. Cardiac Enzymes: No results for input(s): CKTOTAL, CKMB, CKMBINDEX, TROPONINI in the last 168 hours. BNP (last 3 results) No results for input(s): PROBNP in the last 8760 hours. HbA1C: No results for input(s): HGBA1C in the last 72 hours. CBG: No results for input(s): GLUCAP in the last 168 hours. Lipid Profile: No results for input(s): CHOL, HDL, LDLCALC, TRIG, CHOLHDL, LDLDIRECT in the last 72  hours. Thyroid Function Tests: Recent Labs    11/28/20 1030  TSH 0.574   Anemia Panel: No results for input(s): VITAMINB12, FOLATE, FERRITIN, TIBC, IRON, RETICCTPCT in the last 72 hours. Urine analysis:    Component Value Date/Time   COLORURINE YELLOW 11/28/2020 0700   APPEARANCEUR CLEAR 11/28/2020 0700   LABSPEC 1.019 11/28/2020 0700   PHURINE 5.0 11/28/2020 0700   GLUCOSEU NEGATIVE 11/28/2020 0700   HGBUR NEGATIVE 11/28/2020 0700   BILIRUBINUR NEGATIVE 11/28/2020 0700   KETONESUR NEGATIVE 11/28/2020 0700   PROTEINUR NEGATIVE 11/28/2020 0700   UROBILINOGEN 0.2 12/20/2010 1449   NITRITE NEGATIVE 11/28/2020 0700   LEUKOCYTESUR NEGATIVE 11/28/2020 0700    Radiological Exams on Admission: CT Head Wo Contrast  Result Date: 11/27/2020 CLINICAL DATA:  Altered level of consciousness since  07/2020, previous COVID-19 infection EXAM: CT HEAD WITHOUT CONTRAST TECHNIQUE: Contiguous axial images were obtained from the base of the skull through the vertex without intravenous contrast. COMPARISON:  None. FINDINGS: Brain: No acute infarct or hemorrhage. Lateral ventricles and midline structures are unremarkable. No acute extra-axial fluid collections. No mass effect. Vascular: No hyperdense vessel or unexpected calcification. Skull: Normal. Negative for fracture or focal lesion. Sinuses/Orbits: Opacification of the left sphenoid sinus. Remaining paranasal sinuses are clear. Other: None. IMPRESSION: 1. Sphenoid sinus disease. 2. No acute intracranial process. Electronically Signed   By: Randa Ngo M.D.   On: 11/27/2020 22:59   DG Chest Portable 1 View  Result Date: 11/28/2020 CLINICAL DATA:  Shortness of breath. EXAM: PORTABLE CHEST 1 VIEW COMPARISON:  September 02, 2020. FINDINGS: Stable cardiomediastinal silhouette. No pneumothorax or pleural effusion is noted. Both lungs are clear. The visualized skeletal structures are unremarkable. IMPRESSION: No active disease. Electronically Signed   By: Marijo Conception M.D.   On: 11/28/2020 10:23   US Abdomen Limited RUQ (LIVER/GB)  Result Date: 11/28/2020 CLINICAL DATA:  Elevated LFTs, altered mental status EXAM: ULTRASOUND ABDOMEN LIMITED RIGHT UPPER QUADRANT COMPARISON:  None. FINDINGS: Gallbladder: 5 mm calculus. No wall thickening visualized. No sonographic Murphy sign noted by sonographer. Common bile duct: Diameter: 4 mm, normal Liver: No focal lesion identified. Nodular surface contour. Normal parenchymal echogenicity. Patent TIPS is present, which is not evaluated on this study. Other: None. IMPRESSION: Cholelithiasis without sonographic evidence of acute cholecystitis. Cirrhosis.  Patent TIPS, which is not fully evaluated on this study. Electronically Signed   By: Macy Mis M.D.   On: 11/28/2020 16:33    Assessment/Plan Active Problems:   AMS (altered mental status)   Altered mental status.  Has been resolved.  Patient has a long wait time in ED.  Patient with history of cirrhosis, not on any lactulose.  Ammonia at 41 might be responsible for some confusion.  History of constipation -Start her on lactulose-titrate for 2-3 soft bowel movements. -We will get benefit from outpatient GI follow-up. -Liver ultrasound without any new abnormality.  Hypothermia.  Patient did develop transient hypothermia requiring Bair hugger while waiting in ED.  No apparent source of infection.  UA within normal limits.  She was afebrile when seen. Blood cultures pending. -Continue to monitor  Hypothyroidism.  TSH within normal limit. -Continue home dose of Synthroid  Essential hypertension. -Continue home dose of Cozaar  DVT prophylaxis: Lovenox Code Status: Full code Family Communication: Husband was updated at bedside Disposition Plan: Home Consults called: None Admission status: Observation   Lorella Nimrod MD [facility]  If 7PM-7AM, please contact night-coverage [email or web address]  11/28/2020, 7:38 PM   This record has been created  using Systems analyst. Errors have been sought and corrected,but may not always be located. Such creation errors do not reflect on the standard of care.

## 2020-11-28 NOTE — Telephone Encounter (Signed)
Noted, please plan for ER f/u next week if pt not admitted and still hospitalized

## 2020-11-28 NOTE — ED Notes (Signed)
Bair Hugger applied

## 2020-11-28 NOTE — Telephone Encounter (Signed)
Called patient to schedule appointment. Unable to connect. Called husband as per DPR and LVM to call back and schedule.

## 2020-11-28 NOTE — Plan of Care (Signed)

## 2020-11-28 NOTE — Telephone Encounter (Signed)
Per chart review tab pt is at [place]. 

## 2020-11-28 NOTE — ED Notes (Signed)
Patient transported to Ultrasound 

## 2020-11-28 NOTE — ED Notes (Signed)
Attempted to call report, nurse unavailable.

## 2020-11-28 NOTE — ED Provider Notes (Signed)
[facility] EMERGENCY DEPARTMENT Provider Note   CSN: [id number] Arrival date & time: 11/27/20  1927     History Chief Complaint  Patient presents with  . Altered Mental Status    [name] is a 70 y.o. female.  HPI Level 5 caveat due to altered mental status. Patient brought in for mental status changes.  Has been worsening since September.  Has been going up and down a little bit but overall trending down.  Worse yesterday.  Has been waiting around 15 hours to get a room in the ER.  Began after getting COVID in September.  Worsened.  Has been more confused.  Still has been driving and reportedly was driving erratically yesterday.  Got home and made some statements but she did not know why they kept doing things when she was just standing in the driveway.  Patient's husband gives most of the history and states that people at work have been saying that she has been seeing confused things also. Patient overall can give some history but slow to answer and does have some confusion. History of thyroid disease reportedly taking medicine.  Also appears to have had previous liver issues.    Past Medical History:  Diagnosis Date  . Cancer ([place])    cecum  . Elevated liver function tests   . Esophageal varices ([place])   . Hypothyroidism   . Iron deficiency anemia   . Liver disease    chemotherapy complication, per pt, shunts placed to bypass liver  . Malignant neoplasm of cecum ([place])   . Portal hypertension ([place])   . Splenomegaly     Patient Active Problem List   Diagnosis Date Noted  . Pneumonia due to COVID-19 virus   . Acute infectious nonbacterial gastroenteritis   . Acute hypoxemic respiratory failure due to COVID-19 ([place]) 08/12/2020  . LFT elevation 08/12/2020  . Essential hypertension 08/12/2020  . Murmur, cardiac 03/19/2020  . Tibial plateau fracture 03/12/2020  . Elevated blood pressure reading in office without diagnosis of hypertension 03/12/2020   . Tibial plateau fracture, left, closed, initial encounter 03/12/2020  . Chronic pain of both knees 01/29/2020  . Morbid obesity ([place]) 01/29/2020  . Hypothyroidism 06/30/2019  . Basal cell carcinoma of nose 06/30/2019  . Colon cancer ([place]) 11/30/2011  . Hemorrhage of gastrointestinal tract 05/04/2011  . Esophageal varices ([place]) 06/12/2010  . Portal hypertension ([place]) 01/23/2008    Past Surgical History:  Procedure Laterality Date  . ESOPHAGEAL VARICE LIGATION    . HEMICOLECTOMY  01/08/03  . LIVER SURGERY     shunts placed after chemo complication  . SKIN FULL THICKNESS GRAFT N/A 09/12/2019   Procedure: debridement and FTSG to the nose from left upper arm;  Surgeon: Cindra Presume, MD;  Location: [facility];  Service: Plastics;  Laterality: N/A;  2 hours, please     OB History   No obstetric history on file.     Family History  Problem Relation Age of Onset  . Arthritis Mother   . Hearing loss Mother   . Heart disease Mother   . Hypertension Mother   . Arthritis Father   . Diabetes Father   . Heart disease Father   . Breast cancer Neg Hx     Social History   Tobacco Use  . Smoking status: Never Smoker  . Smokeless tobacco: Never Used  Substance Use Topics  . Alcohol use: Never  . Drug use: Never  Home Medications Prior to Admission medications   Medication Sig Start Date End Date Taking? Authorizing Provider  Cholecalciferol (VITAMIN D3) 50 MCG (2000 UT) TABS Take 2,000 Units by mouth at bedtime.    Yes [provider]  Elderberry 575 MG/5ML SYRP Take 15 mLs by mouth at bedtime.   Yes [provider]  losartan (COZAAR) 100 MG tablet TAKE 1 TABLET BY MOUTH EVERY DAY Patient taking differently: Take 100 mg by mouth daily. 09/16/20  Yes Elby Beck, FNP  SYNTHROID 88 MCG tablet Take 1 tablet (88 mcg total) by mouth daily before breakfast. 08/04/20  Yes Elby Beck, FNP  vitamin C (ASCORBIC ACID) 500 MG tablet Take  500 mg by mouth at bedtime.   Yes [provider]  zinc gluconate 50 MG tablet Take 50 mg by mouth at bedtime.   Yes [provider]    Allergies    Contrast media [iodinated diagnostic agents]  Review of Systems   Review of Systems  Unable to perform ROS: Mental status change    Physical Exam Updated Vital Signs BP (!) 124/53   Pulse 91   Temp (!) 97.5 F (36.4 C) (Oral)   Resp 18   Ht 5\' 4"  (1.626 m)   Wt 95.3 kg   SpO2 97%   BMI 36.05 kg/m   Physical Exam Vitals and nursing note reviewed.  Constitutional:      Comments: Alert but does quickly closes eyes in bed.  HENT:     Nose: Nose normal.     Mouth/Throat:     Pharynx: No oropharyngeal exudate or posterior oropharyngeal erythema.  Eyes:     General: No scleral icterus.    Extraocular Movements: Extraocular movements intact.     Pupils: Pupils are equal, round, and reactive to light.  Cardiovascular:     Rate and Rhythm: Normal rate and regular rhythm.  Pulmonary:     Breath sounds: No wheezing or rhonchi.  Abdominal:     Tenderness: There is abdominal tenderness.  Musculoskeletal:     Right lower leg: Edema present.     Left lower leg: Edema present.  Skin:    General: Skin is warm.  Neurological:     Comments: Some confusion.  Slow to answer.  Moves all extremities.     ED Results / Procedures / Treatments   Labs (all labs ordered are listed, but only abnormal results are displayed) Labs Reviewed  COMPREHENSIVE METABOLIC PANEL - Abnormal; Notable for the following components:      Result Value   AST 96 (*)    ALT 58 (*)    Alkaline Phosphatase 172 (*)    Total Bilirubin 1.6 (*)    All other components within normal limits  CBC - Abnormal; Notable for the following components:   WBC 3.3 (*)    RDW 17.2 (*)    Platelets 104 (*)    All other components within normal limits  AMMONIA - Abnormal; Notable for the following components:   Ammonia 41 (*)    All other components  within normal limits  CULTURE, BLOOD (ROUTINE X 2)  CULTURE, BLOOD (ROUTINE X 2)  URINALYSIS, ROUTINE W REFLEX MICROSCOPIC  TSH  LACTIC ACID, PLASMA  CBG MONITORING, ED    EKG None  Radiology CT Head Wo Contrast  Result Date: 11/27/2020 CLINICAL DATA:  Altered level of consciousness since 07/2020, previous COVID-19 infection EXAM: CT HEAD WITHOUT CONTRAST TECHNIQUE: Contiguous axial images were obtained from the base  of the skull through the vertex without intravenous contrast. COMPARISON:  None. FINDINGS: Brain: No acute infarct or hemorrhage. Lateral ventricles and midline structures are unremarkable. No acute extra-axial fluid collections. No mass effect. Vascular: No hyperdense vessel or unexpected calcification. Skull: Normal. Negative for fracture or focal lesion. Sinuses/Orbits: Opacification of the left sphenoid sinus. Remaining paranasal sinuses are clear. Other: None. IMPRESSION: 1. Sphenoid sinus disease. 2. No acute intracranial process. Electronically Signed   By: Sharlet Salina M.D.   On: 11/27/2020 22:59   DG Chest Portable 1 View  Result Date: 11/28/2020 CLINICAL DATA:  Shortness of breath. EXAM: PORTABLE CHEST 1 VIEW COMPARISON:  September 02, 2020. FINDINGS: Stable cardiomediastinal silhouette. No pneumothorax or pleural effusion is noted. Both lungs are clear. The visualized skeletal structures are unremarkable. IMPRESSION: No active disease. Electronically Signed   By: Lupita Raider M.D.   On: 11/28/2020 10:23    Procedures Procedures (including critical care time)  Medications Ordered in ED Medications - No data to display  ED Course  I have reviewed the triage vital signs and the nursing notes.  Pertinent labs & imaging results that were available during my care of the patient were reviewed by me and considered in my medical decision making (see chart for details).    MDM Rules/Calculators/A&P                          Patient with mental status change.  Has  been gradual since September but worsened recently.  Began after COVID infection.  Liver enzymes mildly elevated but now ammonia also elevated.  Was hypothermic on rectal temperature.  Down to 93 degrees.  History of hypothyroidism but TSH reassuring.  WBC slightly low.Head CT reassuring.  Urinalysis reassuring.  However ammonia is elevated.  With worsening mental status hypothermia and elevating ammonia I feels patient benefit from mission to the hospital.  Has had previous TIPS procedure.  Will discuss with hospitalist.   Final Clinical Impression(s) / ED Diagnoses Final diagnoses:  Altered mental status, unspecified altered mental status type    Rx / DC Orders ED Discharge Orders    None       Benjiman Core, MD 11/28/20 1555

## 2020-11-28 NOTE — Telephone Encounter (Signed)
[facility] Night - Client TELEPHONE ADVICE RECORD [facility] Patient Name: [name] Gender: Female DOB: [birth year] Age: 70 Y 64 M 29 D Return Phone Number: [phone] (Primary) Address: City/State/Zip: [place] [address] Client [facility] Night - Client Client Site [facility] Physician Tor Netters- NP Contact Type Call Who Is Calling Patient / Member / Family / Caregiver Call Type Triage / Clinical Caller Name Chao Howdeshell Relationship To Patient Spouse Return Phone Number [phone] (Primary) Chief Complaint Strange, Abnormal, or Paranoid Behavior Reason for Call Symptomatic / Request for [place] states they called earlier today. As of today, they have an appointment to see Dr. Einar Pheasant in march. Patient is deteriorating no one has called back. She does not remember things. She is having trouble remembering things. If she gets lots of rest she is okay. She is on thyroid medication. She has tremors and difficulty coming up with words. She had covid a few months ago. Translation No Nurse Assessment Nurse: Ronnald Ramp, RN, Cassie Date/Time (Eastern Time): 11/27/2020 6:23:31 PM Confirm and document reason for call. If symptomatic, describe symptoms. ---Caller states they called earlier today. As of today, they have an appointment to see Dr. Einar Pheasant in march. Patient is deteriorating no one has called back. Wasn't able to drive well today, went very slow, and was confused - has been going on for a few weeks. She does not remember things. If she gets lots of rest she is okay. She is on thyroid medication. She has tremors and difficulty coming up with words. She had covid a few months ago - was in hosp for 5 days. The "foggy" went away and she was doing good then they started to come back in Dec. 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 Has been okay, but not  normal self, seems to be worse after work, not sure if functioning normal at work. Does the patient have any new or worsening symptoms? ---Yes Will a triage be completed? ---Yes Related visit to physician within the last 2 weeks? ---Yes Does the PT have any chronic conditions? (i.e. diabetes, asthma, this includes High risk factors for pregnancy, etc.) ---Yes List chronic conditions. ---thyroid, htn, hx of broken leg - april 2021 Is this a behavioral health or substance abuse call? ---No PLEASE NOTE: All timestamps contained within this report are represented as [place] Standard Time. CONFIDENTIALTY NOTICE: This fax transmission is intended only for the addressee. It contains information that is legally privileged, confidential or otherwise protected from use or disclosure. If you are not the intended recipient, you are strictly prohibited from reviewing, disclosing, copying using or disseminating any of this information or taking any action in reliance on or regarding this information. If you have received this fax in error, please notify us immediately by telephone so that we can arrange for its return to [place]. Phone: [phone], Toll-Free: [phone], Fax: [phone] Page: 2 of 2 Call Id: [id number] Guidelines Guideline Title Affirmed Question Affirmed Notes Nurse Date/Time Eilene Ghazi Time) Confusion - Delirium Patient sounds very sick or weak to the triager Ronnald Ramp, RN, Cassie 11/27/2020 6:30:52 PM Disp. Time Eilene Ghazi Time) Disposition Final User 11/27/2020 6:48:27 PM Go to ED Now (or PCP triage) Yes Ronnald Ramp, RN, Cassie Caller Disagree/Comply Comply Caller Understands Yes PreDisposition Did not know what to do Care Advice Given Per Guideline GO TO ED NOW (OR PCP TRIAGE): CARE ADVICE given per Confusion-Delirium (Adult) guideline. ANOTHER ADULT  SHOULD DRIVE: * It is better and safer if another adult drives instead of you. Referrals [facility] - [facility]

## 2020-11-28 NOTE — ED Notes (Signed)
Placed pt on 2 L of O2 by [place] due to O2 sats dropping to 87-88% while sleeping. Pt does not have a hx of sleep apnea that she's aware of.

## 2020-11-29 DIAGNOSIS — Z6836 Body mass index (BMI) 36.0-36.9, adult: Secondary | ICD-10-CM | POA: Diagnosis not present

## 2020-11-29 DIAGNOSIS — E039 Hypothyroidism, unspecified: Secondary | ICD-10-CM | POA: Diagnosis present

## 2020-11-29 DIAGNOSIS — D509 Iron deficiency anemia, unspecified: Secondary | ICD-10-CM | POA: Diagnosis present

## 2020-11-29 DIAGNOSIS — T68XXXA Hypothermia, initial encounter: Secondary | ICD-10-CM | POA: Diagnosis present

## 2020-11-29 DIAGNOSIS — Z79899 Other long term (current) drug therapy: Secondary | ICD-10-CM | POA: Diagnosis not present

## 2020-11-29 DIAGNOSIS — R4182 Altered mental status, unspecified: Secondary | ICD-10-CM

## 2020-11-29 DIAGNOSIS — Z20822 Contact with and (suspected) exposure to covid-19: Secondary | ICD-10-CM | POA: Diagnosis present

## 2020-11-29 DIAGNOSIS — K766 Portal hypertension: Secondary | ICD-10-CM | POA: Diagnosis present

## 2020-11-29 DIAGNOSIS — Z91041 Radiographic dye allergy status: Secondary | ICD-10-CM | POA: Diagnosis not present

## 2020-11-29 DIAGNOSIS — D61818 Other pancytopenia: Secondary | ICD-10-CM | POA: Diagnosis present

## 2020-11-29 DIAGNOSIS — I8500 Esophageal varices without bleeding: Secondary | ICD-10-CM | POA: Diagnosis present

## 2020-11-29 DIAGNOSIS — Z85828 Personal history of other malignant neoplasm of skin: Secondary | ICD-10-CM | POA: Diagnosis not present

## 2020-11-29 DIAGNOSIS — I10 Essential (primary) hypertension: Secondary | ICD-10-CM | POA: Diagnosis present

## 2020-11-29 DIAGNOSIS — Z85038 Personal history of other malignant neoplasm of large intestine: Secondary | ICD-10-CM | POA: Diagnosis not present

## 2020-11-29 DIAGNOSIS — Z833 Family history of diabetes mellitus: Secondary | ICD-10-CM | POA: Diagnosis not present

## 2020-11-29 DIAGNOSIS — Z79890 Hormone replacement therapy (postmenopausal): Secondary | ICD-10-CM | POA: Diagnosis not present

## 2020-11-29 DIAGNOSIS — K7460 Unspecified cirrhosis of liver: Secondary | ICD-10-CM | POA: Diagnosis present

## 2020-11-29 DIAGNOSIS — Z8261 Family history of arthritis: Secondary | ICD-10-CM | POA: Diagnosis not present

## 2020-11-29 DIAGNOSIS — K7290 Hepatic failure, unspecified without coma: Secondary | ICD-10-CM | POA: Diagnosis present

## 2020-11-29 DIAGNOSIS — Z8249 Family history of ischemic heart disease and other diseases of the circulatory system: Secondary | ICD-10-CM | POA: Diagnosis not present

## 2020-11-29 DIAGNOSIS — K5900 Constipation, unspecified: Secondary | ICD-10-CM | POA: Diagnosis present

## 2020-11-29 DIAGNOSIS — D731 Hypersplenism: Secondary | ICD-10-CM | POA: Diagnosis present

## 2020-11-29 DIAGNOSIS — Z8616 Personal history of COVID-19: Secondary | ICD-10-CM | POA: Diagnosis not present

## 2020-11-29 LAB — GLUCOSE, CAPILLARY
Glucose-Capillary: 57 mg/dL — ABNORMAL LOW (ref 70–99)
Glucose-Capillary: 72 mg/dL (ref 70–99)

## 2020-11-29 LAB — AMMONIA: Ammonia: 71 umol/L — ABNORMAL HIGH (ref 9–35)

## 2020-11-29 MED ORDER — LEVOTHYROXINE SODIUM 75 MCG PO TABS
75.0000 ug | ORAL_TABLET | Freq: Every day | ORAL | Status: DC
  Administered 2020-11-30: 75 ug via ORAL
  Filled 2020-11-29: qty 1

## 2020-11-29 MED ORDER — LACTULOSE 10 GM/15ML PO SOLN
20.0000 g | Freq: Three times a day (TID) | ORAL | Status: DC
  Administered 2020-11-29 – 2020-11-30 (×3): 20 g via ORAL
  Filled 2020-11-29 (×3): qty 30

## 2020-11-29 NOTE — Progress Notes (Signed)
PROGRESS NOTE    [name]  MPN:[id number] DOB: 03/21/1951 DOA: 11/27/2020 PCP: Elby Beck, FNP (Inactive)   Brief Narrative:  [name] is a 70 y.o. female with medical history significant of stage IIIb colon cancer s/p surgery and 1 year of FOLFOX treatment in 2004, liver cirrhosis secondary to oxaliplatin s/p TIPS in 2012 at [facility], chronic pancytopenia due to hypersplenism came to ED with some concern of confusion at home. These episodes have been intermittent over the last few weeks and she appears to have elevated ammonia levels which appear to be the culprit. She is also noted to have some hypothermia which has improved and blood cultures are pending. Ammonia levels have increased despite lactulose administration and therefore dose will be increased on 1/7.  Assessment & Plan:   Active Problems:   AMS (altered mental status)   Altered mental status.  Has been resolved.  Patient has a long wait time in ED.  Patient with history of cirrhosis, not on any lactulose.  Ammonia at 41 might be responsible for some confusion.  History of constipation -Start her on lactulose-titrate for 2-3 soft bowel movements. -We will get benefit from outpatient GI follow-up. -Liver ultrasound without any new abnormality.  Hypothermia.  Patient did develop transient hypothermia requiring Bair hugger while waiting in ED.  No apparent source of infection.  UA within normal limits.  She was afebrile when seen. Blood cultures pending. -Continue to monitor  Hypothyroidism.  TSH within normal limit. -Reduce home dose of Synthroid to 75 mcg due to decreased TSH noted  Essential hypertension. -Continue home dose of Cozaar   DVT prophylaxis: Lovenox Code Status: Full Family Communication: Husband at bedside Disposition Plan:  Status is: Observation  The patient will require care spanning > 2 midnights and should be moved to inpatient because: Altered mental status  Dispo: The patient  is from: Home              Anticipated d/c is to: Home              Anticipated d/c date is: 1 day              Patient currently is not medically stable to d/c. Patient continues to have elevated ammonia levels and requires further titration of her lactulose dosing today.   Consultants:   None  Procedures:   See below  Antimicrobials:   None   Subjective: Patient seen and evaluated today with improvements in confusion noted, but she still not quite at baseline according to husband at bedside. Ammonia levels have trended up.  Objective: Vitals:   11/28/20 2107 11/29/20 0041 11/29/20 0311 11/29/20 0929  BP:  (!) 126/50 (!) 123/55 (!) 115/48  Pulse:  71 85 65  Resp: (!) 24 16 14    Temp:  (!) 97.4 F (36.3 C) (!) 97.5 F (36.4 C) 97.7 F (36.5 C)  TempSrc:  Oral Oral Oral  SpO2:  98% 97% 97%  Weight:      Height:       No intake or output data in the 24 hours ending 11/29/20 1135 Filed Weights   11/27/20 1952 11/27/20 2042  Weight: 90.7 kg 95.3 kg    Examination:  General exam: Appears calm and comfortable  Respiratory system: Clear to auscultation. Respiratory effort normal. Currently on 2 L nasal cannula oxygen. Cardiovascular system: S1 & S2 heard, RRR.  Gastrointestinal system: Abdomen is soft Central nervous system: Alert and awake Extremities: No edema Skin:  No significant lesions noted Psychiatry: Flat affect.    Data Reviewed: I have personally reviewed following labs and imaging studies  CBC: Recent Labs  Lab 11/27/20 2057  WBC 3.3*  HGB 12.6  HCT 39.1  MCV 98.7  PLT 123456*   Basic Metabolic Panel: Recent Labs  Lab 11/27/20 2057  NA 138  K 3.9  CL 101  CO2 24  GLUCOSE 89  BUN 22  CREATININE 0.90  CALCIUM 10.3   GFR: Estimated Creatinine Clearance: 66 mL/min (by C-G formula based on SCr of 0.9 mg/dL). Liver Function Tests: Recent Labs  Lab 11/27/20 2057  AST 96*  ALT 58*  ALKPHOS 172*  BILITOT 1.6*  PROT 7.2  ALBUMIN 3.6    No results for input(s): LIPASE, AMYLASE in the last 168 hours. Recent Labs  Lab 11/28/20 1040 11/29/20 0911  AMMONIA 41* 71*   Coagulation Profile: No results for input(s): INR, PROTIME in the last 168 hours. Cardiac Enzymes: No results for input(s): CKTOTAL, CKMB, CKMBINDEX, TROPONINI in the last 168 hours. BNP (last 3 results) No results for input(s): PROBNP in the last 8760 hours. HbA1C: No results for input(s): HGBA1C in the last 72 hours. CBG: Recent Labs  Lab 11/29/20 0603 11/29/20 0636  GLUCAP 57* 72   Lipid Profile: No results for input(s): CHOL, HDL, LDLCALC, TRIG, CHOLHDL, LDLDIRECT in the last 72 hours. Thyroid Function Tests: Recent Labs    11/28/20 1030  TSH 0.574   Anemia Panel: No results for input(s): VITAMINB12, FOLATE, FERRITIN, TIBC, IRON, RETICCTPCT in the last 72 hours. Sepsis Labs: Recent Labs  Lab 11/28/20 1030  LATICACIDVEN 1.2    Recent Results (from the past 240 hour(s))  SARS CORONAVIRUS 2 (TAT 6-24 HRS) Nasopharyngeal Nasopharyngeal Swab     Status: None   Collection Time: 11/28/20  2:53 PM   Specimen: Nasopharyngeal Swab  Result Value Ref Range Status   SARS Coronavirus 2 NEGATIVE NEGATIVE Final    Comment: (NOTE) SARS-CoV-2 target nucleic acids are NOT DETECTED.  The SARS-CoV-2 RNA is generally detectable in upper and lower respiratory specimens during the acute phase of infection. Negative results do not preclude SARS-CoV-2 infection, do not rule out co-infections with other pathogens, and should not be used as the sole basis for treatment or other patient management decisions. Negative results must be combined with clinical observations, patient history, and epidemiological information. The expected result is Negative.  Fact Sheet for Patients: SugarRoll.be  Fact Sheet for Healthcare Providers: https://www.woods-mathews.com/  This test is not yet approved or cleared by the  [place] FDA and  has been authorized for detection and/or diagnosis of SARS-CoV-2 by FDA under an Emergency Use Authorization (EUA). This EUA will remain  in effect (meaning this test can be used) for the duration of the COVID-19 declaration under Se ction 564(b)(1) of the Act, 21 U.S.C. section 360bbb-3(b)(1), unless the authorization is terminated or revoked sooner.  Performed at [facility], [address] [place], [place] [address]          Radiology Studies: CT Head Wo Contrast  Result Date: 11/27/2020 CLINICAL DATA:  Altered level of consciousness since 07/2020, previous COVID-19 infection EXAM: CT HEAD WITHOUT CONTRAST TECHNIQUE: Contiguous axial images were obtained from the base of the skull through the vertex without intravenous contrast. COMPARISON:  None. FINDINGS: Brain: No acute infarct or hemorrhage. Lateral ventricles and midline structures are unremarkable. No acute extra-axial fluid collections. No mass effect. Vascular: No hyperdense vessel or unexpected calcification. Skull: Normal.  Negative for fracture or focal lesion. Sinuses/Orbits: Opacification of the left sphenoid sinus. Remaining paranasal sinuses are clear. Other: None. IMPRESSION: 1. Sphenoid sinus disease. 2. No acute intracranial process. Electronically Signed   By: Randa Ngo M.D.   On: 11/27/2020 22:59   DG Chest Portable 1 View  Result Date: 11/28/2020 CLINICAL DATA:  Shortness of breath. EXAM: PORTABLE CHEST 1 VIEW COMPARISON:  September 02, 2020. FINDINGS: Stable cardiomediastinal silhouette. No pneumothorax or pleural effusion is noted. Both lungs are clear. The visualized skeletal structures are unremarkable. IMPRESSION: No active disease. Electronically Signed   By: Marijo Conception M.D.   On: 11/28/2020 10:23   US Abdomen Limited RUQ (LIVER/GB)  Result Date: 11/28/2020 CLINICAL DATA:  Elevated LFTs, altered mental status EXAM: ULTRASOUND ABDOMEN LIMITED RIGHT UPPER QUADRANT COMPARISON:   None. FINDINGS: Gallbladder: 5 mm calculus. No wall thickening visualized. No sonographic Murphy sign noted by sonographer. Common bile duct: Diameter: 4 mm, normal Liver: No focal lesion identified. Nodular surface contour. Normal parenchymal echogenicity. Patent TIPS is present, which is not evaluated on this study. Other: None. IMPRESSION: Cholelithiasis without sonographic evidence of acute cholecystitis. Cirrhosis.  Patent TIPS, which is not fully evaluated on this study. Electronically Signed   By: Macy Mis M.D.   On: 11/28/2020 16:33        Scheduled Meds: . vitamin C  500 mg Oral QHS  . cholecalciferol  2,000 Units Oral Q2200  . enoxaparin (LOVENOX) injection  40 mg Subcutaneous Q24H  . lactulose  20 g Oral TID  . [START ON 11/30/2020] levothyroxine  75 mcg Oral Q0600  . losartan  100 mg Oral Daily  . zinc sulfate  220 mg Oral QHS    LOS: 0 days    Time spent: 35 minutes    Rylynn Kobs Darleen Crocker, DO [facility]  If 7PM-7AM, please contact night-coverage [email or web address] 11/29/2020, 11:35 AM

## 2020-11-29 NOTE — Progress Notes (Signed)
Hypoglycemic Event  CBG: 57  Treatment: 8 oz juice/soda  Symptoms: Shaky  Follow-up CBG: [id number] Possible Reasons for Event: Inadequate meal intake  Comments/MD notified:MD notified.    [name] Tarius Stangelo

## 2020-11-30 DIAGNOSIS — R4182 Altered mental status, unspecified: Secondary | ICD-10-CM | POA: Diagnosis not present

## 2020-11-30 LAB — COMPREHENSIVE METABOLIC PANEL
ALT: 45 U/L — ABNORMAL HIGH (ref 0–44)
AST: 70 U/L — ABNORMAL HIGH (ref 15–41)
Albumin: 2.8 g/dL — ABNORMAL LOW (ref 3.5–5.0)
Alkaline Phosphatase: 146 U/L — ABNORMAL HIGH (ref 38–126)
Anion gap: 9 (ref 5–15)
BUN: 13 mg/dL (ref 8–23)
CO2: 28 mmol/L (ref 22–32)
Calcium: 9.4 mg/dL (ref 8.9–10.3)
Chloride: 105 mmol/L (ref 98–111)
Creatinine, Ser: 0.74 mg/dL (ref 0.44–1.00)
GFR, Estimated: >60 mL/min (ref >60)
Glucose, Bld: 74 mg/dL (ref 70–99)
Potassium: 4.1 mmol/L (ref 3.5–5.1)
Sodium: 142 mmol/L (ref 135–145)
Total Bilirubin: 1.8 mg/dL — ABNORMAL HIGH (ref 0.3–1.2)
Total Protein: 6.1 g/dL — ABNORMAL LOW (ref 6.5–8.1)

## 2020-11-30 LAB — AMMONIA: Ammonia: 27 umol/L (ref 9–35)

## 2020-11-30 LAB — CBC
HCT: 36.1 % (ref 36.0–46.0)
Hemoglobin: 11.6 g/dL — ABNORMAL LOW (ref 12.0–15.0)
MCH: 32.1 pg (ref 26.0–34.0)
MCHC: 32.1 g/dL (ref 30.0–36.0)
MCV: 100 fL (ref 80.0–100.0)
Platelets: 69 10*3/uL — ABNORMAL LOW (ref 150–400)
RBC: 3.61 MIL/uL — ABNORMAL LOW (ref 3.87–5.11)
RDW: 17.3 % — ABNORMAL HIGH (ref 11.5–15.5)
WBC: 2.2 10*3/uL — ABNORMAL LOW (ref 4.0–10.5)
nRBC: 0 % (ref 0.0–0.2)

## 2020-11-30 LAB — GLUCOSE, CAPILLARY: Glucose-Capillary: 83 mg/dL (ref 70–99)

## 2020-11-30 MED ORDER — LACTULOSE 10 GM/15ML PO SOLN: 20.0000 g | Freq: Three times a day (TID) | ORAL | 3 refills | Status: DC

## 2020-11-30 NOTE — Discharge Summary (Signed)
Physician Discharge Summary  [name] DOB: 09/15/1951 DOA: 11/27/2020  PCP: Elby Beck, FNP (Inactive)  Admit date: 11/27/2020  Discharge date: 11/30/2020  Admitted From:Home  Disposition:  Home  Recommendations for Outpatient Follow-up:  1. Follow up with PCP in 1-2 weeks 2. Follow-up with gastroenterology to address hepatic encephalopathy and lactulose dosing in the setting of cirrhosis 3. Continue lactulose as prescribed below 4. Continue on other home medications as otherwise noted 5. Home Synthroid dose reduced to 75 mcg due to decreased levels of TSH noted during this hospitalization.  Patient has this medication at home and will continue to use this dose as opposed to 88 mcg.  Home Health: None  Equipment/Devices: None  Discharge Condition:Stable  CODE STATUS: Full  Diet recommendation: Heart Healthy  Brief/Interim Summary: Katherine Lamere Pooleis a 70 y.o.femalewith medical history significant ofstage IIIb colon cancer s/p surgery and 1 year of FOLFOX treatment in 2004,liver cirrhosis secondary to oxaliplatin s/pTIPS in 2012 at [facility], chronic pancytopenia due to hypersplenism came to ED with some concern of confusion at home. These episodes have been intermittent over the last few weeks and she appears to have elevated ammonia levels which appear to be the culprit.  Patient was also noted to have some transient hypothermia which has improved without any other significant findings noted.  Her temperatures have remained stable in the last 48 hours.  She appears to be back at her usual baseline and ammonia levels have decreased to 27 today with use of lactulose 20 g 3 times daily.  She will need outpatient follow-up with GI in the very near future to address ongoing cirrhosis and hepatic encephalopathy issues.  Discharge Diagnoses:  Active Problems:   AMS (altered mental status)  Principal discharge diagnosis: Hepatic encephalopathy in the setting of  cirrhosis.  Discharge Instructions  Discharge Instructions    Ambulatory referral to Gastroenterology   Complete by: As directed    What is the reason for referral?: Other Comment - cirrhosis/hepatic encephalopathy   Diet - low sodium heart healthy   Complete by: As directed    Increase activity slowly   Complete by: As directed      Allergies as of 11/30/2020      Reactions   Contrast Media [iodinated Diagnostic Agents] Hives      Medication List    STOP taking these medications   Synthroid 88 MCG tablet Generic drug: levothyroxine     TAKE these medications   Elderberry 575 MG/5ML Syrp Take 15 mLs by mouth at bedtime.   lactulose 10 GM/15ML solution Commonly known as: CHRONULAC Take 30 mLs (20 g total) by mouth 3 (three) times daily.   losartan 100 MG tablet Commonly known as: COZAAR TAKE 1 TABLET BY MOUTH EVERY DAY   vitamin C 500 MG tablet Commonly known as: ASCORBIC ACID Take 500 mg by mouth at bedtime.   Vitamin D3 50 MCG (2000 UT) Tabs Take 2,000 Units by mouth at bedtime.   zinc gluconate 50 MG tablet Take 50 mg by mouth at bedtime.       Follow-up Information    Elby Beck, FNP Follow up in 1 week(s).   Specialties: Nurse Practitioner, Family Medicine Contact information: [address] [place] [address] [phone]              Allergies  Allergen Reactions  . Contrast Media [Iodinated Diagnostic Agents] Hives    Consultations:  None   Procedures/Studies: CT Head Wo Contrast  Result Date: 11/27/2020 CLINICAL DATA:  Altered level of consciousness since 07/2020, previous COVID-19 infection EXAM: CT HEAD WITHOUT CONTRAST TECHNIQUE: Contiguous axial images were obtained from the base of the skull through the vertex without intravenous contrast. COMPARISON:  None. FINDINGS: Brain: No acute infarct or hemorrhage. Lateral ventricles and midline structures are unremarkable. No acute extra-axial fluid collections. No mass  effect. Vascular: No hyperdense vessel or unexpected calcification. Skull: Normal. Negative for fracture or focal lesion. Sinuses/Orbits: Opacification of the left sphenoid sinus. Remaining paranasal sinuses are clear. Other: None. IMPRESSION: 1. Sphenoid sinus disease. 2. No acute intracranial process. Electronically Signed   By: Randa Ngo M.D.   On: 11/27/2020 22:59   DG Chest Portable 1 View  Result Date: 11/28/2020 CLINICAL DATA:  Shortness of breath. EXAM: PORTABLE CHEST 1 VIEW COMPARISON:  September 02, 2020. FINDINGS: Stable cardiomediastinal silhouette. No pneumothorax or pleural effusion is noted. Both lungs are clear. The visualized skeletal structures are unremarkable. IMPRESSION: No active disease. Electronically Signed   By: Marijo Conception M.D.   On: 11/28/2020 10:23   US Abdomen Limited RUQ (LIVER/GB)  Result Date: 11/28/2020 CLINICAL DATA:  Elevated LFTs, altered mental status EXAM: ULTRASOUND ABDOMEN LIMITED RIGHT UPPER QUADRANT COMPARISON:  None. FINDINGS: Gallbladder: 5 mm calculus. No wall thickening visualized. No sonographic Murphy sign noted by sonographer. Common bile duct: Diameter: 4 mm, normal Liver: No focal lesion identified. Nodular surface contour. Normal parenchymal echogenicity. Patent TIPS is present, which is not evaluated on this study. Other: None. IMPRESSION: Cholelithiasis without sonographic evidence of acute cholecystitis. Cirrhosis.  Patent TIPS, which is not fully evaluated on this study. Electronically Signed   By: Macy Mis M.D.   On: 11/28/2020 16:33      Discharge Exam: Vitals:   11/30/20 0510 11/30/20 0751  BP: 128/63 128/67  Pulse: 75 69  Resp: 18 18  Temp: (!) 97.5 F (36.4 C) (!) 97.5 F (36.4 C)  SpO2: 98% 95%   Vitals:   11/29/20 2030 11/30/20 0044 11/30/20 0510 11/30/20 0751  BP: (!) 121/51 131/65 128/63 128/67  Pulse: 68 69 75 69  Resp: 18 15 18 18   Temp: 97.6 F (36.4 C) 98 F (36.7 C) (!) 97.5 F (36.4 C) (!) 97.5 F (36.4  C)  TempSrc: Oral Oral Oral Oral  SpO2: 95% 94% 98% 95%  Weight:      Height:        General: Pt is alert, awake, not in acute distress Cardiovascular: RRR, S1/S2 +, no rubs, no gallops Respiratory: CTA bilaterally, no wheezing, no rhonchi Abdominal: Soft, NT, ND, bowel sounds + Extremities: no edema, no cyanosis    The results of significant diagnostics from this hospitalization (including imaging, microbiology, ancillary and laboratory) are listed below for reference.     Microbiology: Recent Results (from the past 240 hour(s))  SARS CORONAVIRUS 2 (TAT 6-24 HRS) Nasopharyngeal Nasopharyngeal Swab     Status: None   Collection Time: 11/28/20  2:53 PM   Specimen: Nasopharyngeal Swab  Result Value Ref Range Status   SARS Coronavirus 2 NEGATIVE NEGATIVE Final    Comment: (NOTE) SARS-CoV-2 target nucleic acids are NOT DETECTED.  The SARS-CoV-2 RNA is generally detectable in upper and lower respiratory specimens during the acute phase of infection. Negative results do not preclude SARS-CoV-2 infection, do not rule out co-infections with other pathogens, and should not be used as the sole basis for treatment or other patient management decisions. Negative results must be combined with clinical observations, patient  history, and epidemiological information. The expected result is Negative.  Fact Sheet for Patients: SugarRoll.be  Fact Sheet for Healthcare Providers: https://www.woods-mathews.com/  This test is not yet approved or cleared by the [place] FDA and  has been authorized for detection and/or diagnosis of SARS-CoV-2 by FDA under an Emergency Use Authorization (EUA). This EUA will remain  in effect (meaning this test can be used) for the duration of the COVID-19 declaration under Se ction 564(b)(1) of the Act, 21 U.S.C. section 360bbb-3(b)(1), unless the authorization is terminated or revoked sooner.  Performed at  [facility], [place] [address], [place], [place] [address]      Labs: BNP (last 3 results) Recent Labs    08/15/20 0600 08/16/20 1149 08/17/20 0135  BNP 111.6* 113.0* [id number]   Basic Metabolic Panel: Recent Labs  Lab 11/27/20 2057 11/30/20 0421  NA 138 142  K 3.9 4.1  CL 101 105  CO2 24 28  GLUCOSE 89 74  BUN 22 13  CREATININE 0.90 0.74  CALCIUM 10.3 9.4   Liver Function Tests: Recent Labs  Lab 11/27/20 2057 11/30/20 0421  AST 96* 70*  ALT 58* 45*  ALKPHOS 172* 146*  BILITOT 1.6* 1.8*  PROT 7.2 6.1*  ALBUMIN 3.6 2.8*   No results for input(s): LIPASE, AMYLASE in the last 168 hours. Recent Labs  Lab 11/28/20 1040 11/29/20 0911 11/30/20 0421  AMMONIA 41* 71* 27   CBC: Recent Labs  Lab 11/27/20 2057 11/30/20 0421  WBC 3.3* 2.2*  HGB 12.6 11.6*  HCT 39.1 36.1  MCV 98.7 100.0  PLT 104* 69*   Cardiac Enzymes: No results for input(s): CKTOTAL, CKMB, CKMBINDEX, TROPONINI in the last 168 hours. BNP: Invalid input(s): POCBNP CBG: Recent Labs  Lab 11/29/20 0603 11/29/20 0636 11/30/20 0600  GLUCAP 57* 72 83   D-Dimer No results for input(s): DDIMER in the last 72 hours. Hgb A1c No results for input(s): HGBA1C in the last 72 hours. Lipid Profile No results for input(s): CHOL, HDL, LDLCALC, TRIG, CHOLHDL, LDLDIRECT in the last 72 hours. Thyroid function studies Recent Labs    11/28/20 1030  TSH 0.574   Anemia work up No results for input(s): VITAMINB12, FOLATE, FERRITIN, TIBC, IRON, RETICCTPCT in the last 72 hours. Urinalysis    Component Value Date/Time   COLORURINE YELLOW 11/28/2020 0700   APPEARANCEUR CLEAR 11/28/2020 0700   LABSPEC 1.019 11/28/2020 0700   PHURINE 5.0 11/28/2020 0700   GLUCOSEU NEGATIVE 11/28/2020 0700   HGBUR NEGATIVE 11/28/2020 0700   BILIRUBINUR NEGATIVE 11/28/2020 0700   KETONESUR NEGATIVE 11/28/2020 0700   PROTEINUR NEGATIVE 11/28/2020 0700   UROBILINOGEN 0.2 12/20/2010 1449   NITRITE NEGATIVE 11/28/2020  0700   LEUKOCYTESUR NEGATIVE 11/28/2020 0700   Sepsis Labs Invalid input(s): PROCALCITONIN,  WBC,  LACTICIDVEN Microbiology Recent Results (from the past 240 hour(s))  SARS CORONAVIRUS 2 (TAT 6-24 HRS) Nasopharyngeal Nasopharyngeal Swab     Status: None   Collection Time: 11/28/20  2:53 PM   Specimen: Nasopharyngeal Swab  Result Value Ref Range Status   SARS Coronavirus 2 NEGATIVE NEGATIVE Final    Comment: (NOTE) SARS-CoV-2 target nucleic acids are NOT DETECTED.  The SARS-CoV-2 RNA is generally detectable in upper and lower respiratory specimens during the acute phase of infection. Negative results do not preclude SARS-CoV-2 infection, do not rule out co-infections with other pathogens, and should not be used as the sole basis for treatment or other patient management decisions. Negative results must be combined with clinical observations,  patient history, and epidemiological information. The expected result is Negative.  Fact Sheet for Patients: SugarRoll.be  Fact Sheet for Healthcare Providers: https://www.woods-mathews.com/  This test is not yet approved or cleared by the [place] FDA and  has been authorized for detection and/or diagnosis of SARS-CoV-2 by FDA under an Emergency Use Authorization (EUA). This EUA will remain  in effect (meaning this test can be used) for the duration of the COVID-19 declaration under Se ction 564(b)(1) of the Act, 21 U.S.C. section 360bbb-3(b)(1), unless the authorization is terminated or revoked sooner.  Performed at [facility], [place] [address] [place], [place] [address]      Time coordinating discharge: 35 minutes  SIGNED:   Rodena Goldmann, DO [facility] 11/30/2020, 9:25 AM  If 7PM-7AM, please contact night-coverage [email or web address]

## 2020-12-02 NOTE — Telephone Encounter (Signed)
Scheduled for hospital follow up 1/12.

## 2020-12-03 LAB — CULTURE, BLOOD (ROUTINE X 2)
Culture: NO GROWTH
Culture: NO GROWTH

## 2020-12-04 ENCOUNTER — Encounter: Payer: Self-pay | Admitting: Family Medicine

## 2020-12-04 ENCOUNTER — Other Ambulatory Visit: Payer: Self-pay | Admitting: *Deleted

## 2020-12-04 ENCOUNTER — Other Ambulatory Visit: Payer: Self-pay

## 2020-12-04 ENCOUNTER — Ambulatory Visit (INDEPENDENT_AMBULATORY_CARE_PROVIDER_SITE_OTHER): Payer: Medicare Other | Admitting: Family Medicine

## 2020-12-04 VITALS — BP 118/64 | HR 61 | Temp 97.9°F | Wt 211.5 lb

## 2020-12-04 DIAGNOSIS — Z85038 Personal history of other malignant neoplasm of large intestine: Secondary | ICD-10-CM | POA: Diagnosis not present

## 2020-12-04 DIAGNOSIS — I10 Essential (primary) hypertension: Secondary | ICD-10-CM | POA: Diagnosis not present

## 2020-12-04 DIAGNOSIS — K7460 Unspecified cirrhosis of liver: Secondary | ICD-10-CM | POA: Insufficient documentation

## 2020-12-04 DIAGNOSIS — K7469 Other cirrhosis of liver: Secondary | ICD-10-CM

## 2020-12-04 DIAGNOSIS — E039 Hypothyroidism, unspecified: Secondary | ICD-10-CM

## 2020-12-04 MED ORDER — LEVOTHYROXINE SODIUM 75 MCG PO TABS: 75.0000 ug | ORAL_TABLET | Freq: Every day | ORAL | 1 refills | Status: DC

## 2020-12-04 NOTE — Patient Instructions (Addendum)
#  Cirrhosis - Continue lactulose - follow-up with GI  Call Dr. Payton Emerald office today to try and schedule   In 1-2 months call to schedule dermatology appointment  Return for thyroid labs - in about 6 weeks

## 2020-12-04 NOTE — Assessment & Plan Note (Addendum)
2/2 to chemo treatment. Complicated by varices and portal hypertension s/p TIPS procedure. Recent hospitalization for hepatic encephalopathy treated with lactulose. Cont lactulose 20 g TID. GI referral. Reviewed RUQ [place] in the hospital with patent TIPS. Will defer labs as recently stable on 1/8 and plan for close GI f/u

## 2020-12-04 NOTE — Assessment & Plan Note (Signed)
Recent decrease due to elevated TSH. Cont levothyroxine 75 mcg. Repeat TSH in 6 weeks

## 2020-12-04 NOTE — Progress Notes (Signed)
Subjective:     [name] is a 70 y.o. female presenting for hospitilization follow up     HPI  #cirrhosis - 2/2 to chemo  - mental status is improving - husband notes she is not back to baseline prior to hospitalization - has not used some of her older phrases and doing things a little different  #hypothyroidism - had a lot of shaking on the levothyroxine 88 mcg which was increased in September - not sure what was causing   Lab Results  Component Value Date   TSH 0.574 11/28/2020      Review of Systems  1/5-11/30/2020: Admission - Hepatic encephalopathy 2/2 cirrhosis. improved with lactulose 20 g TID - levothyroxine reduced due to TSH to 75 mcg  Social History   Tobacco Use  Smoking Status Never Smoker  Smokeless Tobacco Never Used        Objective:    BP Readings from Last 3 Encounters:  12/04/20 118/64  11/30/20 124/62  09/02/20 122/64   Wt Readings from Last 3 Encounters:  12/04/20 211 lb 8 oz (95.9 kg)  11/27/20 210 lb (95.3 kg)  10/04/20 220 lb (99.8 kg)    BP 118/64   Pulse 61   Temp 97.9 F (36.6 C) (Temporal)   Wt 211 lb 8 oz (95.9 kg)   SpO2 96%   BMI 36.30 kg/m    Physical Exam Constitutional:      General: She is not in acute distress.    Appearance: She is well-developed. She is not diaphoretic.  HENT:     Right Ear: External ear normal.     Left Ear: External ear normal.     Nose:     Comments: Skin graft on nose Eyes:     Conjunctiva/sclera: Conjunctivae normal.  Cardiovascular:     Rate and Rhythm: Normal rate and regular rhythm.     Heart sounds: Murmur heard.    Pulmonary:     Effort: Pulmonary effort is normal. No respiratory distress.     Breath sounds: Normal breath sounds. No wheezing.  Musculoskeletal:     Cervical back: Neck supple.  Skin:    General: Skin is warm and dry.     Capillary Refill: Capillary refill takes less than 2 seconds.  Neurological:     Mental Status: She is alert and oriented  to person, place, and time. Mental status is at baseline.  Psychiatric:        Mood and Affect: Mood normal.        Behavior: Behavior normal.           Assessment & Plan:   Problem List Items Addressed This Visit      Cardiovascular and Mediastinum   Essential hypertension - Primary    Controlled. Cont losartan 100 mg        Digestive   Cirrhosis of liver (HCC)    2/2 to chemo treatment. Complicated by varices and portal hypertension s/p TIPS procedure. Recent hospitalization for hepatic encephalopathy treated with lactulose. Cont lactulose 20 g TID. GI referral. Reviewed RUQ [place] in the hospital with patent TIPS. Will defer labs as recently stable on 1/8 and plan for close GI f/u      Relevant Orders   Ambulatory referral to Gastroenterology     Endocrine   Hypothyroidism    Recent decrease due to elevated TSH. Cont levothyroxine 75 mcg. Repeat TSH in 6 weeks      Relevant Medications   levothyroxine (SYNTHROID)  75 MCG tablet   Other Relevant Orders   TSH   T4, free     Other   Hx of colon cancer, stage III   Relevant Orders   Ambulatory referral to Gastroenterology       Return in about 3 months (around 03/04/2021) for unless doing well, then 6 months.  Lesleigh Noe, MD  This visit occurred during the SARS-CoV-2 public health emergency.  Safety protocols were in place, including screening questions prior to the visit, additional usage of staff PPE, and extensive cleaning of exam room while observing appropriate contact time as indicated for disinfecting solutions.

## 2020-12-04 NOTE — Patient Outreach (Signed)
[facility]) Care Management  12/04/2020  [name] [birth year] [id number]   EMMI-GENERAL DISCHARGE-SUCCESSFUL RED ON EMMI ALERT Day #1 Date: 12/02/2020 Red Alert Reason: DON'T KNOW ABOUT NEW PRESCRIPTIONS  OUTREACH #1 RN spoke with pt today concerning the above emmi. Pt confirms that issues has been resolved with no additional needs. States she has a follow up appointment with her provider and there are no addition issues.   Will close case with no additional issues or needs. Pt managing all other medical issues accordingly.  Raina Mina, RN Care Management Coordinator [place] Office [phone]

## 2020-12-04 NOTE — Assessment & Plan Note (Signed)
Controlled. Cont losartan 100 mg

## 2020-12-05 ENCOUNTER — Telehealth: Payer: Self-pay

## 2020-12-05 ENCOUNTER — Encounter: Payer: Self-pay | Admitting: Gastroenterology

## 2020-12-05 ENCOUNTER — Ambulatory Visit: Payer: Medicare Other | Admitting: Gastroenterology

## 2020-12-05 ENCOUNTER — Other Ambulatory Visit: Payer: Self-pay | Admitting: Gastroenterology

## 2020-12-05 VITALS — BP 90/52 | HR 80 | Ht 64.0 in | Wt 213.0 lb

## 2020-12-05 DIAGNOSIS — K7290 Hepatic failure, unspecified without coma: Secondary | ICD-10-CM

## 2020-12-05 DIAGNOSIS — K7460 Unspecified cirrhosis of liver: Secondary | ICD-10-CM | POA: Diagnosis not present

## 2020-12-05 DIAGNOSIS — K766 Portal hypertension: Secondary | ICD-10-CM

## 2020-12-05 DIAGNOSIS — K7682 Hepatic encephalopathy: Secondary | ICD-10-CM

## 2020-12-05 DIAGNOSIS — R748 Abnormal levels of other serum enzymes: Secondary | ICD-10-CM | POA: Diagnosis not present

## 2020-12-05 MED ORDER — LACTULOSE 10 GM/15ML PO SOLN: 20.0000 g | Freq: Three times a day (TID) | ORAL | 3 refills | Status: DC

## 2020-12-05 MED ORDER — RIFAXIMIN 550 MG PO TABS: 550.0000 mg | ORAL_TABLET | Freq: Two times a day (BID) | ORAL | 2 refills | Status: DC

## 2020-12-05 NOTE — Patient Instructions (Addendum)
Continue to take your lactulose three times daily. Our goal is that you have at least three bowel movements daily. You could try to substitute with Miralax for each dose of lactulose.  You may need to increase the dose if you aren't having enough bowel movements.   PRESCRIPTION MEDICATION(S): We have sent the following medication(s) to your pharmacy:  . Lactulose - please take 20g by mouth THREE times daily  I would like to add Xifaxan 550 mg twice daily for the confusion.   PRESCRIPTION MEDICATION(S): We have sent the following medication(s) to your pharmacy:  . Doreene Nest - Please take 550mg  by mouth TWICE daily. NOTE: This medication will require a PRIOR AUTHORIZATION with your insurance. We will submit for approval today. This process may take up to 5-7 business days to receive a response from your insurance company.  I recommend an ultrasound of your TIPS (the ultrasound performed in the hospital did not test the TIPS).  You have been scheduled for an abdominal ultrasound to evaluate the portal and hepatic vein as well as follow up to the TIPS procedure. Please plan to arrive at [facility] Radiology (1st floor of hospital) on 12/13/20 at 9:30am for registration. Your exam will begin at 10am. Please DO NOT eat or drink anything after midnight the night before your exam. Should you need to reschedule your appointment, please contact radiology at [phone].   I would like to see you at least every 6 months, earlier if needed.  If you are age 28 or older, your body mass index should be between 23-30. Your Body mass index is 36.56 kg/m. If this is out of the aforementioned range listed, please consider follow up with your Primary Care Provider.  Thank you for trusting me with your gastrointestinal care!    Thornton Park, MD, MPH

## 2020-12-05 NOTE — Progress Notes (Addendum)
Referring Provider: Lesleigh Noe, MD Primary Care Physician:  Lesleigh Noe, MD   Reason for Consultation:  Hepatic encephalopathy   IMPRESSION:  Portal hypertension    - initially Oxiplatin-induced non-cirrhotic portal hypertension    - now with suspected cirrhosis by ultrasound, associated thrombocytopenia Hepatic encephalopathy requiring hospitalization    - on lactulose with Grade 1 HE noted on history today    - no etiology for onset of HE identified Variceal bleed 2012 s/p TIPS History of stage IIIb cecal adenocarcinoma s/p surgery and FOLFAX 2004    - overdue surveillance colonoscopy    - will proceed with encephalopathy has stablized  PLAN: - Continue lactulose 20g TID - Add Xifaxan 550 mg BID - Doppler abdominal ultrasound to follow-up TIPS now and in 6 months - Labs today to calculate a MELD score - Colonoscopy when HE is well controlled - Low-threshold for follow-up at [facility] or the [facility] (depending on insurance) - Return to this clinic in 6 months after the TIPS study or earlier as needed   HPI: 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 She had a segment colectomy for stage IIIb cecal adenocarcinoma arising in the region of the IC valve in 2004 treated with [place]. Surveillance endoscopy had been performed at both [facility] and [facility]. I had recommended surveillance colonoscopy in 2020 given the available records showing her last colonoscopy in 2015. This has not yet been performed.    Her cecal adenocarcinoma was treated with oxaliplatin complicated by oxyplatin-induced non-cirrhotic portal hypertension requiring a TIPS in 2012 at [facility] for variceal bleeding. She was previously followed by [facility]) as well as [facility] clinic. Last seen at [facility] by Dr. Monica Martinez in 2015. TIPS ultrasound in 2019 showed normal flows. She had had no further problems with liver  disease.   She is now referred after a recent hospitalization 11/27/20-11/30/20 for hepatic encephalopathy and transient hypothermia.  The interval history is obtained through the patient, her husband who accompanies her to this appointment, and review of her electronic health record. Etiology of encephalopathy is unclear. Urinalysis was negative. Blood cultures were negative.  Abdominal ultrasound (not TIPS study) showed cholelithiasis and cirrhosis. Head CT showed sphenoid sinus disease.  Ammonia levels normalized with lactulose therapy.   Short term memory has improved on lactulose 20 g TID.   She is sleeping a lot - requiring 4-5 hour afternoon naps. Averaging 12+ hours.  Now walks with a cane. No falls.  She is not driving.  She denies jaundice, ascites, LE edema, renal insufficiency. GI ROS is otherwise negative.   Labs 11/30/20: Na 142, crt 0.74, TB 1.8, alb 2.8, AST 70, ALT 45, alk phos 146, hgb 11.6, platelets 69 - nor recent coagulation studies - calculated MELD score of 9 assuming INR is normal  Past Medical History:  Diagnosis Date  . Cancer ([place])    cecum  . Elevated liver function tests   . Esophageal varices ([place])   . Hemorrhage of gastrointestinal tract 05/04/2011  . Hypothyroidism   . Iron deficiency anemia   . Liver disease    chemotherapy complication, per pt, shunts placed to bypass liver  . Malignant neoplasm of cecum ([place])   . Portal hypertension ([place])   . Splenomegaly     Past Surgical History:  Procedure Laterality Date  . ESOPHAGEAL VARICE LIGATION    . HEMICOLECTOMY  01/08/03  . LIVER SURGERY     shunts placed after chemo complication  . SKIN FULL THICKNESS GRAFT N/A 09/12/2019   Procedure: debridement and FTSG to the nose from left upper arm;  Surgeon: Cindra Presume, MD;  Location: [facility];  Service: Plastics;  Laterality: N/A;  2 hours, please  . TIPS PROCEDURE      Current Outpatient Medications  Medication Sig Dispense Refill  .  Cholecalciferol (VITAMIN D3) 50 MCG (2000 UT) TABS Take 2,000 Units by mouth at bedtime.     Kendall Flack 575 MG/5ML SYRP Take 15 mLs by mouth at bedtime.    Marland Kitchen lactulose (CHRONULAC) 10 GM/15ML solution Take 30 mLs (20 g total) by mouth 3 (three) times daily. 946 mL 3  . levothyroxine (SYNTHROID) 75 MCG tablet Take 1 tablet (75 mcg total) by mouth daily before breakfast. Needs name brand 90 tablet 1  . losartan (COZAAR) 100 MG tablet TAKE 1 TABLET BY MOUTH EVERY DAY 90 tablet 0  . vitamin B-12 (CYANOCOBALAMIN) 1000 MCG tablet Take 1,000 mcg by mouth daily.    . vitamin C (ASCORBIC ACID) 500 MG tablet Take 500 mg by mouth at bedtime.    Marland Kitchen zinc gluconate 50 MG tablet Take 50 mg by mouth at bedtime.     No current facility-administered medications for this visit.    Allergies as of 12/05/2020 - Review Complete 12/05/2020  Allergen Reaction Noted  . Contrast media [iodinated diagnostic agents] Hives 11/30/2011    Family History  Problem Relation Age of Onset  . Arthritis Mother   . Hearing loss Mother   . Heart disease Mother   . Hypertension Mother   . Arthritis Father   . Diabetes Father   . Heart disease Father   . Breast cancer Neg Hx   . Colon cancer Neg Hx   . Esophageal cancer Neg Hx   . Pancreatic cancer Neg Hx   . Stomach cancer Neg Hx     Social History   Socioeconomic History  . Marital status: Married    Spouse name: [name]  . Number of children: 2  . Years of education: Not on file  . Highest education level: Not on file  Occupational History  . Occupation: Librarian  Tobacco Use  . Smoking status: Never Smoker  . Smokeless tobacco: Never Used  Vaping Use  . Vaping Use: Never used  Substance and Sexual Activity  . Alcohol use: Never  . Drug use: Never  . Sexual activity: Not Currently    Partners: Male  Other Topics Concern  . Not on file  Social History Narrative   12/04/20   From: the area   Living: with husband, [name] (1994)   Work: Licensed conveyancer at  [employer]      Family: 2 children Colletta [place] and Engineer, technical sales - 2 grandchildren - nearby      Enjoys: read      Exercise: trying to get back to exercise   Diet: healthy, limits fast foods      Safety   Seat belts: Yes    Guns: Yes  and secure   Safe in relationships: Yes    Social Determinants of Health   Financial Resource Strain: Low Risk   . Difficulty of Paying Living Expenses: Not hard at all  Food Insecurity: No Food Insecurity  . Worried About Charity fundraiser in the Last Year: Never true  . Ran Out of Food in the Last Year: Never  true  Transportation Needs: No Transportation Needs  . Lack of Transportation (Medical): No  . Lack of Transportation (Non-Medical): No  Physical Activity: Inactive  . Days of Exercise per Week: 0 days  . Minutes of Exercise per Session: 0 min  Stress: No Stress Concern Present  . Feeling of Stress : Not at all  Social Connections: Not on file  Intimate Partner Violence: Not At Risk  . Fear of Current or Ex-Partner: No  . Emotionally Abused: No  . Physically Abused: No  . Sexually Abused: No    Review of Systems: 12 system ROS is negative except as noted above.  Filed Weights   12/05/20 1101  Weight: 213 lb (96.6 kg)    Physical Exam: General:   Alert, in NAD. No scleral icterus. No bilateral temporal wasting.  Heart:  Regular rate and rhythm; no murmurs Pulm: Clear anteriorly; no wheezing Abdomen:  Soft. Central obesity. Nontender. Nondistended. Normal bowel sounds. No rebound or guarding. No fluid wave.  LAD: No inguinal or umbilical LAD Extremities:  Without edema. Neurologic:  Alert and  oriented x4;  grossly normal neurologically; no asterixis or clonus. Skin: No jaundice. Mild palmar erythema. Spider angioma on the chest wall.  Psych:  Alert and cooperative. Normal mood and affect.   Roshawna Colclasure L. Tarri Glenn, MD, MPH [facility] 12/05/2020, 11:14 AM

## 2020-12-05 NOTE — Telephone Encounter (Signed)
Received Key Code from [facility] to complete PA for Xifaxan. Key Code as follows:   [id number]  CLEXN  1951-10-16  Attempt was made to initiate PA utilizing Key Code provided. Encountered the following error:   Cannot find matching patient with Name and Date of Birth provided. For additional information, please contact the phone number on the back of the member prescription ID card.  Above request was archived as "NOT SENT" w/ the following error included. New request submitted as below:  PRIOR AUTHORIZATION  PA initiation date: 12/05/20  Medication: [employer]: [facility] Submission completed electronically through [employer] [facility]: Yes  Will await insurance response re: approval/denial.  Tawny Hopping (Key: [id number])  Your information has been sent to [facility]. Tawny Hopping (Key: [id number])  [facility] is reviewing your PA request. Typically an electronic response will be received within 72 hours. To check for an update later, open this request from your dashboard.  You may close this dialog and return to your dashboard to perform other tasks. Braeleigh Pyper (Key: [id number]) Xifaxan 550MG  tablets   Form [facility] Medicare Part D Electronic Prior Authorization Form (2017 NCPDP) Created 5 minutes ago Sent to Plan 3 minutes ago Plan Response 3 minutes ago Submit Clinical Questions 2 minutes ago Determination Wait for Determination Please wait for [facility] Medicare 2017 NCPDP to return a determination.

## 2020-12-06 ENCOUNTER — Other Ambulatory Visit: Payer: Self-pay | Admitting: *Deleted

## 2020-12-06 NOTE — Telephone Encounter (Signed)
APPROVAL  Medication: [employer]: [facility] PA response: APPROVED Approval dates: 12/05/20 through 11/22/21  Document has been labeled and placed in scan file for HIM and for our future reference.  [name] (Key: [id number])  This request has received a Favorable outcome.  Please note any additional information provided by [facility] at the bottom of your screen. [name] (Key: [id number]) Xifaxan 550MG  tablets   Form [facility] Medicare Part D Electronic Prior Authorization Form (2017 NCPDP) Created 19 hours ago Sent to Plan 19 hours ago Plan Response 19 hours ago Submit Clinical Questions 19 hours ago Determination Favorable 19 hours ago Message from Plan Request Reference Number: [id number]. XIFAXAN TAB 550MG  is approved through 11/22/2021. Your patient may now fill this prescription and it will be covered.

## 2020-12-06 NOTE — Patient Outreach (Signed)
[facility]) Care Management  12/06/2020  [name] [birth year] [id number]   EMMI- GENERAL DISCHARGE-SUCCESSFUL RED ON EMMI ALERT Day #4 Date:12/05/2020 Red Alert Reason: LOSS OF INTEREST  OUTREACH #1 RN spoke with pt today concerning the above emmi. Pt indicated she misunderstood the question and answer it incorrectly. Confirm no loss of interest for this pt with no needs or issues to address at this time. Pt very appreciative for the follow up call but has again no loss of interest. Offered to assist with any other needs however pt denied with no needs.  Will close this emmi with no further needs.   Raina Mina, RN Care Management Coordinator [place] Office [phone]

## 2020-12-07 ENCOUNTER — Telehealth: Payer: Self-pay | Admitting: Family Medicine

## 2020-12-07 DIAGNOSIS — K7469 Other cirrhosis of liver: Secondary | ICD-10-CM

## 2020-12-07 NOTE — Telephone Encounter (Signed)
I had a 20+ minute conversation with the patient's husband [place].  This is an exceedingly complicated medical case and complicated liver case.  Patient's husband does understand that I am neither hepatologist or gastroenterologist.  She was admitted with hepatic encephalopathy and very recently discharge.  At the time of her discharge, the patient did have a normal ammonia.  She is currently mentating normally, and she does not appear to be acutely ill.  Last night the patient did have a temperature between 93 and 94 F.  The patient's husband did try to warm her up with a space heater, multiple space heaters as well as warm clothing and blankets.  Her temperature increased to 99, and right now it is 96.5.  He is concerned about this, and I did my best to give advice in this regard.  He was also worried about this being thyroid driven, and I reviewed with him her normal thyroid-stimulating hormone while in the hospital.  I did my best to give the patient counsel regarding potential risks and benefits.  I worry that then going directly to the ER with exposure to COVID-19, and she could potentially do exceedingly poorly.  Without any acute abnormality, I am not clear that the ER would do anything acutely.  For now, recommended that she continue to take her lactulose, they monitor her temperature, but minor abnormalities including in the 96-97 range is not likely to be of much clinical significance.  I get a send a copy of this message to the patient's gastroenterologist, and additionally will send to her primary care doctor.

## 2020-12-09 ENCOUNTER — Encounter: Payer: Self-pay | Admitting: Gastroenterology

## 2020-12-09 NOTE — Telephone Encounter (Signed)
Thank you :)

## 2020-12-09 NOTE — Telephone Encounter (Signed)
Appreciate support of Dr. Tarri Glenn and Dr. Lorelei Pont.   Routing to MA to check in on patient.   Will also place referral and have referral coordinator check insurance.

## 2020-12-09 NOTE — Addendum Note (Signed)
Addended by: Waunita Schooner R on: 12/09/2020 12:09 PM   Modules accepted: Orders

## 2020-12-09 NOTE — Telephone Encounter (Signed)
Thanks for your help. There is clearly some opportunity for ongoing educational opportunity for [name] and her husband, who functions as her primary care giver. I just saw her last week. I will ask my staff to check in on her on Tuesday. It may be helpful for them to see the [facility] team who have a clinic in [place], if it is covered by her insurance.

## 2020-12-10 NOTE — Telephone Encounter (Signed)
[place] Night - Client TELEPHONE ADVICE RECORD [facility] Patient Name: [name] Gender: Female DOB: May 11, 1951 Age: 70 Y 45 M 11 D Return Phone Number: [phone] (Primary), [phone] (Secondary) Address: [place] [address] Client [facility] Night - Client Client Site [place] - Night Contact Type Call Who Is Calling Patient / Member / Family / Caregiver Call Type Triage / Clinical Caller Name [name] Relationship To Patient Spouse Return Phone Number [phone] (Primary) Chief Complaint Unclassified Symptom Reason for Call Symptomatic / Request for [place] reports that his wife's temperature dropped to 92 last night. They were able to get it back to normal, but it is currently 96.4. She has a history of liver and thyroid issues. [place] Not Listed On call MD and husband will decided where the pt will go Translation No Nurse Assessment Nurse: Katherine Abbe, RN, [name] Date/Time [name] Time): 12/07/2020 1:49:35 PM Confirm and document reason for call. If symptomatic, describe symptoms. ---Caller reports that his wife's temperature dropped to 92 last night. They were able to get it back to normal, but it is currently 96.4. She has a history of liver and thyroid issues. Pt has a liver shunt for the past 10 years. Pt was seen in the ER for high ammonia level. Pt sent home on [place]. Pt now having feeling hot and then has cold spells. HER temp has dropped 96.4. Does the patient have any new or worsening symptoms? ---Yes Will a triage be completed? ---Yes Related visit to physician within the last 2 weeks? ---Yes Does the PT have any chronic conditions? (i.e. diabetes, asthma, this includes High risk factors for pregnancy, etc.) ---Yes List chronic conditions. ---Tips liver drain thyroid Colon Cancer in the past. Treatment caused liver  issues Is this a behavioral health or substance abuse call? ---No Guidelines Guideline Title Affirmed Question Affirmed Notes Nurse Date/Time (Eastern Time) Cold Exposure (Hypothermia) [1] Body temperature < 96 F (35.5 C) rectally or < 95 F (35 C) orally AND [2] persists > 1 hour despite rewarming [name] 12/07/2020 2:08:29 PM PLEASE NOTE: All timestamps contained within this report are represented as [place] Standard Time. CONFIDENTIALTY NOTICE: This fax transmission is intended only for the addressee. It contains information that is legally privileged, confidential or otherwise protected from use or disclosure. If you are not the intended recipient, you are strictly prohibited from reviewing, disclosing, copying using or disseminating any of this information or taking any action in reliance on or regarding this information. If you have received this fax in error, please notify us immediately by telephone so that we can arrange for its return to [place]. Phone: [phone], Toll-Free: [phone], Fax: [phone] Page: 2 of 2 Call Id: [id number] [place]. Time [name] Time) Disposition Final User 12/07/2020 2:18:36 PM Called On-Call Provider Katherine Abbe, RN, [name] 12/07/2020 2:19:56 PM Go to ED Now (or PCP triage) Yes Katherine Abbe, RN, [name] Disagree/Comply Comply Caller Understands Yes PreDisposition Call Doctor Care Advice Given Per Guideline GO TO ED NOW (OR PCP TRIAGE): Referrals GO TO FACILITY OTHER - SPECIFY Paging [facility] Phone DateTime Result/Outcome Message Type Notes Katherine Loffler MD [phone] 12/07/2020 2:18:36 PM Called On Call Provider - Reached Doctor Paged [name] - MD 12/07/2020 2:19:13 PM Spoke with On Call - General Message Result MD warm transferred to the On Call

## 2020-12-10 NOTE — Telephone Encounter (Signed)
Spoke with pt to  get an update on how she is doing, Pt states she is doing well. Reports for the past 5 days she has had 2 BM's but today she has had 3. DIscussed with her since the xifaxan is to expensive that Dr. Tarri Glenn recommends taking the lactulose tid and may also take miralax to have 3 BM's daily. Pt verbalized understanding and knows there is a Pharmacist, community message that was sent to her also stating the above information.

## 2020-12-10 NOTE — Telephone Encounter (Signed)
[facility] Night - Client TELEPHONE ADVICE RECORD [facility] Patient Name: [name] Gender: Female DOB: August 21, 1951 Age: 70 Y 33 M 33 D Return Phone Number: [phone] (Primary), [phone] (Secondary) Address: [place] [address] Client [facility] Night - Client Client Site [place] Physician Waunita Schooner- MD Contact Type Call Who Is Calling Patient / Member / Family / Caregiver Call Type Triage / Clinical Caller Name [name] Casso Relationship To Patient Spouse Return Phone Number [phone] (Primary) Chief Complaint FEVER - >= 104 or <97 Reason for Call Symptomatic / Request for [place] says his wife has a Thyroid and Liver issue, has temp of 93.9 Translation No Nurse Assessment Nurse: Drue Flirt, RN, Tia Date/Time (Eastern Time): 12/06/2020 10:52:33 PM Confirm and document reason for call. If symptomatic, describe symptoms. ---Caller says his wife has a Thyroid and Liver issue, has temp of 93.9 orally. Does the patient have any new or worsening symptoms? ---Yes Will a triage be completed? ---Yes Related visit to physician within the last 2 weeks? ---Yes Does the PT have any chronic conditions? (i.e. diabetes, asthma, this includes High risk factors for pregnancy, etc.) ---Yes List chronic conditions. ---thyroid problems liver problems Is this a behavioral health or substance abuse call? ---No Guidelines Guideline Title Affirmed Question Affirmed Notes Nurse Date/Time (Eastern Time) Cold Exposure (Hypothermia) Body temperature < 95 F (35 C) rectally or < 94 F (34.4 C) orally Drue Flirt, RN, Tia 12/06/2020 10:54:26 PM Disp. Time Eilene Ghazi Time) Disposition Final User 12/06/2020 10:50:48 PM Send to Urgent Queue Adela Lank 12/06/2020 11:10:32 PM Go to ED Now Yes Drue Flirt, RN, Tia Caller Disagree/Comply Disagree PLEASE NOTE: All  timestamps contained within this report are represented as [place] Standard Time. CONFIDENTIALTY NOTICE: This fax transmission is intended only for the addressee. It contains information that is legally privileged, confidential or otherwise protected from use or disclosure. If you are not the intended recipient, you are strictly prohibited from reviewing, disclosing, copying using or disseminating any of this information or taking any action in reliance on or regarding this information. If you have received this fax in error, please notify us immediately by telephone so that we can arrange for its return to [place]. Phone: [phone], Toll-Free: [phone], Fax: [phone] Page: 2 of 2 Call Id: [id number] [place] Understands Yes PreDisposition Go to ED Care Advice Given Per Guideline GO TO ED NOW: * You need to be seen in the Emergency Department. * Go to the ED at ___________ [place] now. Drive carefully. WARMTH: * Continue warm blankets and warm fluids. ANOTHER ADULT SHOULD DRIVE: * It is better and safer if another adult drives instead of you. CARE ADVICE given per Cold Exposure (Adult) guideline. Comments User: Zannie Cove, RN Date/Time Eilene Ghazi Time): 12/06/2020 11:04:25 PM wants to monitor for right now. i encouraged them to go to the ER. Referrals [facility] - ED

## 2020-12-10 NOTE — Telephone Encounter (Signed)
Noted, appreciate Dr. Payton Emerald support

## 2020-12-13 ENCOUNTER — Other Ambulatory Visit: Payer: Self-pay

## 2020-12-13 ENCOUNTER — Other Ambulatory Visit: Payer: Self-pay | Admitting: Gastroenterology

## 2020-12-13 ENCOUNTER — Ambulatory Visit (HOSPITAL_COMMUNITY)
Admission: RE | Admit: 2020-12-13 | Discharge: 2020-12-13 | Disposition: A | Discharge status: Home / Self Care | Payer: Medicare Other | Source: Ambulatory Visit | Attending: Gastroenterology | Admitting: Gastroenterology

## 2020-12-13 DIAGNOSIS — K766 Portal hypertension: Secondary | ICD-10-CM

## 2020-12-13 DIAGNOSIS — I81 Portal vein thrombosis: Secondary | ICD-10-CM | POA: Diagnosis not present

## 2020-12-13 DIAGNOSIS — K7682 Hepatic encephalopathy: Secondary | ICD-10-CM

## 2020-12-13 DIAGNOSIS — R748 Abnormal levels of other serum enzymes: Secondary | ICD-10-CM | POA: Insufficient documentation

## 2020-12-13 DIAGNOSIS — I82890 Acute embolism and thrombosis of other specified veins: Secondary | ICD-10-CM | POA: Diagnosis not present

## 2020-12-13 DIAGNOSIS — Z95828 Presence of other vascular implants and grafts: Secondary | ICD-10-CM

## 2020-12-13 DIAGNOSIS — K7200 Acute and subacute hepatic failure without coma: Secondary | ICD-10-CM | POA: Diagnosis not present

## 2020-12-13 DIAGNOSIS — K7290 Hepatic failure, unspecified without coma: Secondary | ICD-10-CM | POA: Diagnosis not present

## 2020-12-13 MED ORDER — LACTULOSE 10 GM/15ML PO SOLN: 20.0000 g | Freq: Three times a day (TID) | ORAL | 11 refills | Status: DC

## 2020-12-14 DIAGNOSIS — S82143A Displaced bicondylar fracture of unspecified tibia, initial encounter for closed fracture: Secondary | ICD-10-CM | POA: Diagnosis not present

## 2020-12-20 ENCOUNTER — Ambulatory Visit
Admission: RE | Admit: 2020-12-20 | Discharge: 2020-12-20 | Disposition: A | Discharge status: Home / Self Care | Payer: Medicare Other | Source: Ambulatory Visit | Attending: Gastroenterology | Admitting: Gastroenterology

## 2020-12-20 DIAGNOSIS — Z9689 Presence of other specified functional implants: Secondary | ICD-10-CM | POA: Diagnosis not present

## 2020-12-20 DIAGNOSIS — K7290 Hepatic failure, unspecified without coma: Secondary | ICD-10-CM | POA: Diagnosis not present

## 2020-12-20 DIAGNOSIS — Z95828 Presence of other vascular implants and grafts: Secondary | ICD-10-CM

## 2020-12-20 HISTORY — PX: IR RADIOLOGIST EVAL & MGMT: IMG5224

## 2020-12-20 NOTE — Consult Note (Signed)
Chief Complaint: Patient was seen in consultation today for TIPS follow-up at the request of Beavers,Kimberly  Referring Physician(s): Beavers,Kimberly  History of Present Illness: [name] is a 70 y.o. female with history of Oxiplatin-induced noncirrhotic portal hypertension.  Patient had a TIPS procedure performed at [facility] for variceal bleeding in 2012.  Patient was recently hospitalized for hepatic encephalopathy and had a liver duplex to evaluate the TIPS stent.  The liver duplex report raised concern for narrowing in the TIPS stent and recommended evaluation for possible revision.  Patient's ammonia level on 11/28/2020 was 41.  Ammonia level on 11/30/2018 was 27.  Hepatic encephalopathy is being managed with lactulose 20 mg 3 times daily and Xifaxan 550 mg twice daily.  Patient has not had an upper endoscopy in many years, she thinks it was back at [facility].  She denies abdominal distention.  Her mental status has improved since she was discharged from the hospital.  Patient works as a Licensed conveyancer.  She does have lower extremity swelling that resolves after elevation.  Past Medical History:  Diagnosis Date  . Cancer ([place])    cecum  . Elevated liver function tests   . Esophageal varices ([place])   . Hemorrhage of gastrointestinal tract 05/04/2011  . Hypothyroidism   . Iron deficiency anemia   . Liver disease    chemotherapy complication, per pt, shunts placed to bypass liver  . Malignant neoplasm of cecum ([place])   . Portal hypertension ([place])   . Splenomegaly     Past Surgical History:  Procedure Laterality Date  . ESOPHAGEAL VARICE LIGATION    . HEMICOLECTOMY  01/08/03  . LIVER SURGERY     shunts placed after chemo complication  . SKIN FULL THICKNESS GRAFT N/A 09/12/2019   Procedure: debridement and FTSG to the nose from left upper arm;  Surgeon: Cindra Presume, MD;  Location: [place];  Service: Plastics;  Laterality: N/A;  2 hours, please  . TIPS PROCEDURE       Allergies: Contrast media [iodinated diagnostic agents]  Medications: Prior to Admission medications   Medication Sig Start Date End Date Taking? Authorizing Provider  Cholecalciferol (VITAMIN D3) 50 MCG (2000 UT) TABS Take 2,000 Units by mouth at bedtime.     [provider]  Elderberry 575 MG/5ML SYRP Take 15 mLs by mouth at bedtime.    [provider]  lactulose (CHRONULAC) 10 GM/15ML solution Take 30 mLs (20 g total) by mouth 3 (three) times daily. 12/13/20   Thornton Park, MD  levothyroxine (SYNTHROID) 75 MCG tablet Take 1 tablet (75 mcg total) by mouth daily before breakfast. Needs name brand 12/04/20   Lesleigh Noe, MD  losartan (COZAAR) 100 MG tablet TAKE 1 TABLET BY MOUTH EVERY DAY 09/16/20   Elby Beck, FNP  vitamin B-12 (CYANOCOBALAMIN) 1000 MCG tablet Take 1,000 mcg by mouth daily.    [provider]  vitamin C (ASCORBIC ACID) 500 MG tablet Take 500 mg by mouth at bedtime.    [provider]  XIFAXAN 550 MG TABS tablet TAKE 1 TABLET BY MOUTH 2 TIMES DAILY. 12/06/20   Thornton Park, MD  zinc gluconate 50 MG tablet Take 50 mg by mouth at bedtime.    [provider]     Family History  Problem Relation Age of Onset  . Arthritis Mother   . Hearing loss Mother   . Heart disease Mother   . Hypertension Mother   . Arthritis Father   .  Diabetes Father   . Heart disease Father   . Breast cancer Neg Hx   . Colon cancer Neg Hx   . Esophageal cancer Neg Hx   . Pancreatic cancer Neg Hx   . Stomach cancer Neg Hx      Review of Systems  Gastrointestinal: Negative for abdominal distention and abdominal pain.  Neurological:       Altered mental status.    Vital Signs: There were no vitals taken for this visit.  Physical Exam Constitutional:      Appearance: Normal appearance.  Cardiovascular:     Rate and Rhythm: Normal rate and regular rhythm.  Pulmonary:     Breath sounds: Normal breath sounds.   Abdominal:     General: There is no distension.     Palpations: Abdomen is soft.     Tenderness: There is no abdominal tenderness.  Musculoskeletal:     Right lower leg: Edema present.     Left lower leg: Edema present.     Comments: Mild pitting edema in both ankles.  Neurological:     Mental Status: She is alert.       Imaging: CT Head Wo Contrast  Result Date: 11/27/2020 CLINICAL DATA:  Altered level of consciousness since 07/2020, previous COVID-19 infection EXAM: CT HEAD WITHOUT CONTRAST TECHNIQUE: Contiguous axial images were obtained from the base of the skull through the vertex without intravenous contrast. COMPARISON:  None. FINDINGS: Brain: No acute infarct or hemorrhage. Lateral ventricles and midline structures are unremarkable. No acute extra-axial fluid collections. No mass effect. Vascular: No hyperdense vessel or unexpected calcification. Skull: Normal. Negative for fracture or focal lesion. Sinuses/Orbits: Opacification of the left sphenoid sinus. Remaining paranasal sinuses are clear. Other: None. IMPRESSION: 1. Sphenoid sinus disease. 2. No acute intracranial process. Electronically Signed   By: Randa Ngo M.D.   On: 11/27/2020 22:59   DG Chest Portable 1 View  Result Date: 11/28/2020 CLINICAL DATA:  Shortness of breath. EXAM: PORTABLE CHEST 1 VIEW COMPARISON:  September 02, 2020. FINDINGS: Stable cardiomediastinal silhouette. No pneumothorax or pleural effusion is noted. Both lungs are clear. The visualized skeletal structures are unremarkable. IMPRESSION: No active disease. Electronically Signed   By: Marijo Conception M.D.   On: 11/28/2020 10:23   US LIVER DOPPLER  Result Date: 12/13/2020 CLINICAL DATA:  Portal hypertension Hepatic encephalopathy Abnormal liver enzymes TIPS (2015) EXAM: DUPLEX ULTRASOUND OF LIVER TECHNIQUE: Color and duplex Doppler ultrasound was performed to evaluate the hepatic in-flow and out-flow vessels. COMPARISON:  11/18/2018 FINDINGS: Liver:  Normal parenchymal echogenicity. Normal hepatic contour without nodularity. No focal lesion, mass or intrahepatic biliary ductal dilatation. Portal Vein Velocities Main:  69 cm/sec Right: 95 cm/sec Left: 60 cm/sec TIPS: Portal Vein end: 61 cm/sec Mid: 102 cm/sec Hepatic Vein end: 156 cm/sec Hepatic Vein Velocities Right:  48 cm/sec Middle:  57 cm/sec Left:  31 cm/sec IVC: Present and patent with normal respiratory phasicity. Hepatic Artery Velocity:  79 cm/sec Splenic Vein Velocity:  48 cm/sec Spleen: Normal in size. Portal Vein Occlusion/Thrombus: No Splenic Vein Occlusion/Thrombus: No Ascites: None Varices: None IMPRESSION: Patent TIPS. Decreased velocities in the portal vein end of the TIPS as well as interval increase in velocities in the hepatic vein end of the stent are suspicious for stenosis. Consider further evaluation with fluoroscopic TIPS evaluation and possible revision. Electronically Signed   By: Miachel Roux M.D.   On: 12/13/2020 10:54   US Abdomen Limited RUQ (LIVER/GB)  Result Date: 11/28/2020 CLINICAL DATA:  Elevated LFTs, altered mental status EXAM: ULTRASOUND ABDOMEN LIMITED RIGHT UPPER QUADRANT COMPARISON:  None. FINDINGS: Gallbladder: 5 mm calculus. No wall thickening visualized. No sonographic Murphy sign noted by sonographer. Common bile duct: Diameter: 4 mm, normal Liver: No focal lesion identified. Nodular surface contour. Normal parenchymal echogenicity. Patent TIPS is present, which is not evaluated on this study. Other: None. IMPRESSION: Cholelithiasis without sonographic evidence of acute cholecystitis. Cirrhosis.  Patent TIPS, which is not fully evaluated on this study. Electronically Signed   By: Macy Mis M.D.   On: 11/28/2020 16:33    Labs:  CBC: Recent Labs    08/17/20 0135 09/02/20 1538 11/27/20 2057 11/30/20 0421  WBC 5.0 4.9 3.3* 2.2*  HGB 12.4 11.8* 12.6 11.6*  HCT 37.3 35.2* 39.1 36.1  PLT 265 145.0* 104* 69*    COAGS: Recent Labs    08/13/20 0445   INR 1.2    BMP: Recent Labs    08/14/20 0745 08/15/20 0600 08/16/20 1149 08/17/20 0135 09/02/20 1538 11/27/20 2057 11/30/20 0421  NA 140 144 142 141 141 138 142  K 3.3* 3.8 3.9 3.6 4.4 3.9 4.1  CL 107 109 108 106 106 101 105  CO2 20* 21* 20* 24 29 24 28   GLUCOSE 158* 114* 103* 184* 100* 89 74  BUN 13 17 21 21 11 22 13   CALCIUM 7.9* 8.6* 8.5* 8.3* 9.1 10.3 9.4  CREATININE 0.77 0.77 0.79 0.77 0.75 0.90 0.74  GFRNONAA >60 >60 >60 >60  --  >60 >60  GFRAA >60 >60 >60 >60  --   --   --     LIVER FUNCTION TESTS: Recent Labs    08/17/20 0135 09/02/20 1538 11/27/20 2057 11/30/20 0421  BILITOT 1.2 0.9 1.6* 1.8*  AST 92* 29 96* 70*  ALT 46* 25 58* 45*  ALKPHOS 213* 164* 172* 146*  PROT 6.0* 6.4 7.2 6.1*  ALBUMIN 2.4* 3.2* 3.6 2.8*    TUMOR MARKERS: No results for input(s): AFPTM, CEA, CA199, CHROMGRNA in the last 8760 hours.  Assessment and Plan:  70 year old with history of portal hypertension that was initially oxaliplatin-induced.  Patient had a TIPS procedure performed in 2012 due to variceal bleeding.  Patient now has suspected cirrhosis.  Patient was recently hospitalized for hepatic encephalopathy and this has been successfully managed with medical therapy.  Patient had a liver duplex recently to evaluate TIPS stent.  The ultrasound report raised concern for possible stenosis along the hepatic end of the stent.  I have reviewed these images and also a reviewed a liver duplex report from [facility] [place] in 2015.  My interpretation of the study is that the TIPS stent is widely patent.  The velocity near the hepatic end of the stent measures close to 1.5 m/sec but this measured 1.1 m/sec based on the study from 2015.  At this time, I do not see evidence for significant stenosis involving the stent.  In addition, I explained to the patient that any TIPS intervention to improve the flow would possibly worsen her encephalopathy.  Therefore, I do not see an indication to perform an invasive  interrogation of the TIPS stent with at this time.  Recommendation a follow-up TIPS ultrasound in 6 months.  If there is additional concern for TIPS dysfunction at that time, we could revisit the idea of invasive interrogation of the stent.  Reportedly, the patient is scheduled to be seen at the [facility].  Thank you for this interesting consult.  I greatly enjoyed meeting  [name] and look forward to participating in their care.  A copy of this report was sent to the requesting provider on this date.  Electronically Signed: Burman Riis 12/20/2020, 3:40 PM   I spent a total of  20 minutes  in face to face in clinical consultation, greater than 50% of which was counseling/coordinating care for TIPS management.

## 2020-12-24 NOTE — Patient Instructions (Signed)
Reminder in epic for repeat TIPS [place] in 6 mth.

## 2021-01-15 ENCOUNTER — Other Ambulatory Visit: Payer: Self-pay

## 2021-01-15 ENCOUNTER — Other Ambulatory Visit (INDEPENDENT_AMBULATORY_CARE_PROVIDER_SITE_OTHER): Payer: Medicare Other

## 2021-01-15 DIAGNOSIS — E039 Hypothyroidism, unspecified: Secondary | ICD-10-CM | POA: Diagnosis not present

## 2021-01-16 LAB — T4, FREE: Free T4: 0.88 ng/dL (ref 0.60–1.60)

## 2021-01-16 LAB — TSH: TSH: 0.99 u[IU]/mL (ref 0.35–4.50)

## 2021-01-29 ENCOUNTER — Other Ambulatory Visit: Payer: Medicare Other

## 2021-01-29 DIAGNOSIS — K7460 Unspecified cirrhosis of liver: Secondary | ICD-10-CM | POA: Diagnosis not present

## 2021-01-29 DIAGNOSIS — K7290 Hepatic failure, unspecified without coma: Secondary | ICD-10-CM | POA: Diagnosis not present

## 2021-01-30 ENCOUNTER — Other Ambulatory Visit: Payer: Self-pay

## 2021-01-30 ENCOUNTER — Ambulatory Visit (INDEPENDENT_AMBULATORY_CARE_PROVIDER_SITE_OTHER): Payer: Medicare Other | Admitting: Family Medicine

## 2021-01-30 ENCOUNTER — Encounter: Payer: Self-pay | Admitting: Family Medicine

## 2021-01-30 VITALS — BP 82/50 | HR 64 | Temp 97.0°F | Ht 64.0 in | Wt 217.8 lb

## 2021-01-30 DIAGNOSIS — Z0000 Encounter for general adult medical examination without abnormal findings: Secondary | ICD-10-CM

## 2021-01-30 DIAGNOSIS — Z2821 Immunization not carried out because of patient refusal: Secondary | ICD-10-CM | POA: Diagnosis not present

## 2021-01-30 DIAGNOSIS — I10 Essential (primary) hypertension: Secondary | ICD-10-CM

## 2021-01-30 NOTE — Assessment & Plan Note (Signed)
Controlled. Occasionally low. Cont losartan 100 mg.

## 2021-01-30 NOTE — Patient Instructions (Signed)
Consider - Pneumonia shot (2 of these) - Shingles - Covid and flu  Call to get your mammogram  Talk with Dr. Tarri Glenn about Colon cancer screening

## 2021-01-30 NOTE — Progress Notes (Signed)
Subjective:   [name] is a 70 y.o. female who presents for Medicare Annual (Subsequent) preventive examination.  Review of Systems    Review of Systems  Constitutional: Negative for chills and fever.  HENT: Negative for congestion and sore throat.   Eyes: Negative for blurred vision and double vision.  Respiratory: Negative for shortness of breath.   Cardiovascular: Negative for chest pain.  Gastrointestinal: Negative for heartburn, nausea and vomiting.  Genitourinary: Negative.   Musculoskeletal: Negative.  Negative for myalgias.  Skin: Negative for rash.  Neurological: Negative for dizziness and headaches.  Endo/Heme/Allergies: Does not bruise/bleed easily.  Psychiatric/Behavioral: Negative for depression. The patient is not nervous/anxious.           Objective:    Today's Vitals   01/30/21 1445  BP: (!) 82/50  Pulse: 64  Temp: (!) 97 F (36.1 C)  TempSrc: Temporal  SpO2: 96%  Weight: 217 lb 12 oz (98.8 kg)  Height: 5\' 4"  (1.626 m)   Body mass index is 37.38 kg/m.  Advanced Directives 01/30/2021 11/27/2020 08/14/2020 08/12/2020 03/12/2020 01/24/2020 09/12/2019  Does Patient Have a Medical Advance Directive? No No No No No No No  Would patient like information on creating a medical advance directive? Yes (MAU/Ambulatory/Procedural Areas - Information given) - No - Patient declined No - Patient declined No - Patient declined No - Patient declined No - Patient declined    Current Medications (verified) Outpatient Encounter Medications as of 01/30/2021  Medication Sig   Cholecalciferol (VITAMIN D3) 50 MCG (2000 UT) TABS Take 2,000 Units by mouth at bedtime.    Elderberry 575 MG/5ML SYRP Take 15 mLs by mouth at bedtime.   L-ORNITHINE PO Take 6 g by mouth 3 (three) times daily.   lactulose (CHRONULAC) 10 GM/15ML solution Take 30 mLs (20 g total) by mouth 3 (three) times daily.   levOCARNitine (L-CARNITINE PO) Take by mouth daily at 12 noon.   levothyroxine  (SYNTHROID) 75 MCG tablet Take 1 tablet (75 mcg total) by mouth daily before breakfast. Needs name brand   losartan (COZAAR) 100 MG tablet TAKE 1 TABLET BY MOUTH EVERY DAY   OVER THE COUNTER MEDICATION Take 12 g by mouth daily. Name: Branch chain amino acids   vitamin B-12 (CYANOCOBALAMIN) 1000 MCG tablet Take 1,000 mcg by mouth daily.   vitamin C (ASCORBIC ACID) 500 MG tablet Take 500 mg by mouth at bedtime.   zinc gluconate 50 MG tablet Take 150 mg by mouth at bedtime.   [DISCONTINUED] XIFAXAN 550 MG TABS tablet TAKE 1 TABLET BY MOUTH 2 TIMES DAILY.   No facility-administered encounter medications on file as of 01/30/2021.    Allergies (verified) Contrast media [iodinated diagnostic agents]   History: Past Medical History:  Diagnosis Date   Cancer ([place])    cecum   Elevated liver function tests    Esophageal varices ([facility])    Hemorrhage of gastrointestinal tract 05/04/2011   Hypothyroidism    Iron deficiency anemia    Liver disease    chemotherapy complication, per pt, shunts placed to bypass liver   Malignant neoplasm of cecum ([place])    Portal hypertension ([place])    Splenomegaly    Past Surgical History:  Procedure Laterality Date   ESOPHAGEAL VARICE LIGATION     HEMICOLECTOMY  01/08/03   IR RADIOLOGIST EVAL & MGMT  12/20/2020   LIVER SURGERY     shunts placed after chemo complication   SKIN FULL THICKNESS GRAFT N/A 09/12/2019  Procedure: debridement and FTSG to the nose from left upper arm;  Surgeon: Cindra Presume, MD;  Location: [place];  Service: Plastics;  Laterality: N/A;  2 hours, please   TIPS PROCEDURE     Family History  Problem Relation Age of Onset   Arthritis Mother    Hearing loss Mother    Heart disease Mother    Hypertension Mother    Arthritis Father    Diabetes Father    Heart disease Father    Breast cancer Neg Hx    Colon cancer Neg Hx    Esophageal cancer Neg Hx    Pancreatic cancer Neg Hx     Stomach cancer Neg Hx    Social History   Socioeconomic History   Marital status: Married    Spouse name: Forensic scientist   Number of children: 2   Years of education: Not on file   Highest education level: Not on file  Occupational History   Occupation: Librarian  Tobacco Use   Smoking status: Never Smoker   Smokeless tobacco: Never Used  Scientific laboratory technician Use: Never used  Substance and Sexual Activity   Alcohol use: Never   Drug use: Never   Sexual activity: Not Currently    Partners: Male  Other Topics Concern   Not on file  Social History Narrative   12/04/20   From: the area   Living: with husband, Ozzie Hoyle (1994)   Work: Licensed conveyancer at [employer]      Family: 2 children Advertising account executive and Engineer, technical sales - 2 grandchildren - nearby      Enjoys: read      Exercise: trying to get back to exercise   Diet: healthy, limits fast foods      Safety   Seat belts: Yes    Guns: Yes  and secure   Safe in relationships: Yes    Social Determinants of Radio broadcast assistant Strain: Not on file  Food Insecurity: Not on file  Transportation Needs: Not on file  Physical Activity: Not on file  Stress: Not on file  Social Connections: Not on file    Tobacco Counseling Counseling given: Not Answered   Clinical Intake:  Pre-visit preparation completed: No  Pain : No/denies pain     BMI - recorded: 37.38 Nutritional Status: BMI > 30  Obese Nutritional Risks: None Diabetes: No  How often do you need to have someone help you when you read instructions, pamphlets, or other written materials from your doctor or pharmacy?: 1 - Never What is the last grade level you completed in school?: college  Diabetic? no  Interpreter Needed?: No      Activities of Daily Living In your present state of health, do you have any difficulty performing the following activities: 08/14/2020 03/12/2020  Hearing? N N  Vision? N N  Difficulty concentrating or making decisions? N  N  Walking or climbing stairs? Y Y  Dressing or bathing? N N  Doing errands, shopping? N N  Some recent data might be hidden    Patient Care Team: Lesleigh Noe, MD as PCP - General (Family Medicine)  Indicate any recent Medical Services you may have received from other than [facility] providers in the past year (date may be approximate).     Assessment:   This is a routine wellness examination for [facility].  Hearing/Vision screen No exam data present  Dietary issues and exercise activities discussed:    Goals  Patient Stated     01/24/2020, I will maintain and continue and medications as prescribed.     Patient Stated     Stay out of the hospital. Restart walking program      Depression Screen PHQ 2/9 Scores 01/30/2021 01/24/2020 01/18/2019 09/14/2018  PHQ - 2 Score 0 0 0 0  PHQ- 9 Score - 0 - -    Fall Risk Fall Risk  01/30/2021 01/24/2020 01/18/2019 09/14/2018  Falls in the past year? 1 0 0 No  Number falls in past yr: 0 0 - -  Injury with Fall? 1 0 - -  Risk for fall due to : History of fall(s);Impaired balance/gait No Fall Risks - -  Follow up Falls evaluation completed Falls evaluation completed;Falls prevention discussed - -    FALL RISK PREVENTION PERTAINING TO THE HOME:  Any stairs in or around the home? No  If so, are there any without handrails? Yes  Home free of loose throw rugs in walkways, pet beds, electrical cords, etc? Yes  Adequate lighting in your home to reduce risk of falls? Yes   ASSISTIVE DEVICES UTILIZED TO PREVENT FALLS:  Life alert? No  Use of a cane, walker or w/c? Yes  Grab bars in the bathroom? No  Shower chair or bench in shower? Yes  Elevated toilet seat or a handicapped toilet? No     Cognitive Function: MMSE - Mini Mental State Exam 01/24/2020  Orientation to time 5  Orientation to Place 5  Registration 3  Attention/ Calculation 5  Recall 3  Language- repeat 1         Mini-Cog - 01/30/21 1514    Normal clock drawing test? yes     How many words correct? 3            Immunizations  There is no immunization history on file for this patient.  TDAP status: Due, Education has been provided regarding the importance of this vaccine. Advised may receive this vaccine at local pharmacy or Health Dept. Aware to provide a copy of the vaccination record if obtained from local pharmacy or Health Dept. Verbalized acceptance and understanding.  Flu Vaccine status: Declined, Education has been provided regarding the importance of this vaccine but patient still declined. Advised may receive this vaccine at local pharmacy or Health Dept. Aware to provide a copy of the vaccination record if obtained from local pharmacy or Health Dept. Verbalized acceptance and understanding.  Pneumococcal vaccine status: Declined,  Education has been provided regarding the importance of this vaccine but patient still declined. Advised may receive this vaccine at local pharmacy or Health Dept. Aware to provide a copy of the vaccination record if obtained from local pharmacy or Health Dept. Verbalized acceptance and understanding.   Covid-19 vaccine status: Declined, Education has been provided regarding the importance of this vaccine but patient still declined. Advised may receive this vaccine at local pharmacy or Health Dept.or vaccine clinic. Aware to provide a copy of the vaccination record if obtained from local pharmacy or Health Dept. Verbalized acceptance and understanding.  Qualifies for Shingles Vaccine? Yes   Zostavax completed No   Shingrix Completed?: No.    Education has been provided regarding the importance of this vaccine. Patient has been advised to call insurance company to determine out of pocket expense if they have not yet received this vaccine. Advised may also receive vaccine at local pharmacy or Health Dept. Verbalized acceptance and understanding.  Screening Tests Health Maintenance  Topic Date  Due   COVID-19 Vaccine (1)  Never done   TETANUS/TDAP  Never done   COLONOSCOPY (Pts 45-40yrs Insurance coverage will need to be confirmed)  06/25/2020   INFLUENZA VACCINE  02/20/2021 (Originally 06/23/2020)   PNA vac Low Risk Adult (1 of 2 - PCV13) 10/04/2021 (Originally 05/29/2016)   MAMMOGRAM  08/15/2021   DEXA SCAN  Completed   Hepatitis C Screening  Completed   HPV VACCINES  Aged Out    Health Maintenance  Health Maintenance Due  Topic Date Due   COVID-19 Vaccine (1) Never done   TETANUS/TDAP  Never done   COLONOSCOPY (Pts 45-52yrs Insurance coverage will need to be confirmed)  06/25/2020    Colorectal cancer screening: Type of screening: Colonoscopy. Completed unknown. Repeat every 5 years  Mammogram status: Completed 2020. Repeat every year  Bone Density status: Completed 2020. Results reflect: Bone density results: OSTEOPENIA. Repeat every 3 years.    Additional Screening:  Hepatitis C Screening: does qualify; Completed unknown  Vision Screening: Recommended annual ophthalmology exams for early detection of glaucoma and other disorders of the eye. Is the patient up to date with their annual eye exam?  Yes    Dental Screening: Recommended annual dental exams for proper oral hygiene  Community Resource Referral / Chronic Care Management: CRR required this visit?  No   CCM required this visit?  No      Plan:     I have personally reviewed and noted the following in the patients chart:    Medical and social history  Use of alcohol, tobacco or illicit drugs   Current medications and supplements  Functional ability and status  Nutritional status  Physical activity  Advanced directives  List of other physicians  Hospitalizations, surgeries, and ER visits in previous 12 months  Vitals  Screenings to include cognitive, depression, and falls  Referrals and appointments  In addition, I have reviewed and discussed with patient certain preventive protocols, quality  metrics, and best practice recommendations. A written personalized care plan for preventive services as well as general preventive health recommendations were provided to patient.     Lesleigh Noe, MD   01/30/2021

## 2021-02-05 ENCOUNTER — Ambulatory Visit: Payer: Medicare Other | Admitting: Neurology

## 2021-02-05 ENCOUNTER — Encounter: Payer: Medicare Other | Admitting: Family Medicine

## 2021-02-19 ENCOUNTER — Telehealth: Payer: Self-pay

## 2021-02-19 DIAGNOSIS — I10 Essential (primary) hypertension: Secondary | ICD-10-CM

## 2021-02-19 MED ORDER — LOSARTAN POTASSIUM 100 MG PO TABS: 100.0000 mg | ORAL_TABLET | Freq: Every day | ORAL | 3 refills | Status: DC

## 2021-02-19 NOTE — Telephone Encounter (Signed)
  LAST APPOINTMENT DATE: 01/30/2021   NEXT APPOINTMENT DATE:@Visit  date not found  MEDICATION:losartan (COZAAR) 100 MG tablet  PHARMACY:[id number] - [place], [place] - [address]  Comments: Patient is calling in stating that she went online and did an automatic refill for the rx. Received a call from [facility] that states that they wont refill until Monday due to it being to early, but they have the instructions for patient as taking half of a pill.   Please advise

## 2021-04-22 ENCOUNTER — Telehealth: Payer: Self-pay | Admitting: Gastroenterology

## 2021-04-22 ENCOUNTER — Other Ambulatory Visit: Payer: Self-pay

## 2021-04-22 ENCOUNTER — Other Ambulatory Visit: Payer: Self-pay | Admitting: Gastroenterology

## 2021-04-22 DIAGNOSIS — R748 Abnormal levels of other serum enzymes: Secondary | ICD-10-CM

## 2021-04-22 DIAGNOSIS — K7290 Hepatic failure, unspecified without coma: Secondary | ICD-10-CM

## 2021-04-22 DIAGNOSIS — K7682 Hepatic encephalopathy: Secondary | ICD-10-CM

## 2021-04-22 DIAGNOSIS — K766 Portal hypertension: Secondary | ICD-10-CM

## 2021-04-22 MED ORDER — LACTULOSE 10 GM/15ML PO SOLN: 20.0000 g | Freq: Three times a day (TID) | ORAL | 1 refills | Status: DC

## 2021-04-22 NOTE — Telephone Encounter (Signed)
Pharmacy  [facility] - [place] [place] [address]  Phone:  [phone] Fax:  [phone]    Outpatient Medication Detail   Disp Refills Start End   lactulose (CHRONULAC) 10 GM/15ML solution 2700 mL 1 04/22/2021    Sig - Route: Take 30 mLs (20 g total) by mouth 3 (three) times daily. - Oral   Sent to pharmacy as: lactulose ([place]) 10 GM/15ML solution   Notes to Pharmacy: FUTURE REFILLS REQUIRE AN APPT   E-Prescribing Status: Receipt confirmed by pharmacy (04/22/2021 1:21 PM EDT)

## 2021-04-22 NOTE — Telephone Encounter (Signed)
Pt called and stated that she is out of her medication Lactulose. She currently has changed her pharmacy to [place]. Please give her a call. Thank  you

## 2021-05-06 DIAGNOSIS — K7469 Other cirrhosis of liver: Secondary | ICD-10-CM | POA: Diagnosis not present

## 2021-05-06 DIAGNOSIS — K7290 Hepatic failure, unspecified without coma: Secondary | ICD-10-CM | POA: Diagnosis not present

## 2021-05-06 DIAGNOSIS — I8510 Secondary esophageal varices without bleeding: Secondary | ICD-10-CM | POA: Diagnosis not present

## 2021-05-06 DIAGNOSIS — K766 Portal hypertension: Secondary | ICD-10-CM | POA: Diagnosis not present

## 2021-05-06 DIAGNOSIS — D376 Neoplasm of uncertain behavior of liver, gallbladder and bile ducts: Secondary | ICD-10-CM | POA: Diagnosis not present

## 2021-05-06 DIAGNOSIS — I10 Essential (primary) hypertension: Secondary | ICD-10-CM | POA: Insufficient documentation

## 2021-05-06 DIAGNOSIS — Z95828 Presence of other vascular implants and grafts: Secondary | ICD-10-CM | POA: Insufficient documentation

## 2021-05-06 DIAGNOSIS — Z8679 Personal history of other diseases of the circulatory system: Secondary | ICD-10-CM | POA: Insufficient documentation

## 2021-05-06 DIAGNOSIS — C4491 Basal cell carcinoma of skin, unspecified: Secondary | ICD-10-CM | POA: Insufficient documentation

## 2021-05-06 DIAGNOSIS — C180 Malignant neoplasm of cecum: Secondary | ICD-10-CM | POA: Insufficient documentation

## 2021-05-07 ENCOUNTER — Other Ambulatory Visit: Payer: Self-pay | Admitting: Nurse Practitioner

## 2021-05-07 DIAGNOSIS — K7469 Other cirrhosis of liver: Secondary | ICD-10-CM | POA: Diagnosis not present

## 2021-05-07 DIAGNOSIS — K7682 Hepatic encephalopathy: Secondary | ICD-10-CM

## 2021-05-09 ENCOUNTER — Other Ambulatory Visit: Payer: Self-pay

## 2021-05-15 ENCOUNTER — Other Ambulatory Visit: Payer: Self-pay | Admitting: Diagnostic Radiology

## 2021-05-15 DIAGNOSIS — Z95828 Presence of other vascular implants and grafts: Secondary | ICD-10-CM

## 2021-05-19 ENCOUNTER — Ambulatory Visit
Admission: RE | Admit: 2021-05-19 | Discharge: 2021-05-19 | Disposition: A | Discharge status: Home / Self Care | Payer: Medicare Other | Source: Ambulatory Visit | Attending: Nurse Practitioner | Admitting: Nurse Practitioner

## 2021-05-19 DIAGNOSIS — K7682 Hepatic encephalopathy: Secondary | ICD-10-CM

## 2021-05-19 DIAGNOSIS — K8020 Calculus of gallbladder without cholecystitis without obstruction: Secondary | ICD-10-CM | POA: Diagnosis not present

## 2021-05-19 DIAGNOSIS — K7469 Other cirrhosis of liver: Secondary | ICD-10-CM

## 2021-05-22 ENCOUNTER — Other Ambulatory Visit: Payer: Self-pay | Admitting: Nurse Practitioner

## 2021-05-22 DIAGNOSIS — D376 Neoplasm of uncertain behavior of liver, gallbladder and bile ducts: Secondary | ICD-10-CM

## 2021-05-29 ENCOUNTER — Ambulatory Visit
Admission: RE | Admit: 2021-05-29 | Discharge: 2021-05-29 | Disposition: A | Discharge status: Home / Self Care | Payer: Medicare Other | Source: Ambulatory Visit | Attending: Diagnostic Radiology | Admitting: Diagnostic Radiology

## 2021-05-29 ENCOUNTER — Encounter: Payer: Self-pay | Admitting: *Deleted

## 2021-05-29 DIAGNOSIS — Z95828 Presence of other vascular implants and grafts: Secondary | ICD-10-CM

## 2021-05-29 DIAGNOSIS — Z9689 Presence of other specified functional implants: Secondary | ICD-10-CM | POA: Diagnosis not present

## 2021-05-29 HISTORY — PX: IR RADIOLOGIST EVAL & MGMT: IMG5224

## 2021-05-29 NOTE — Progress Notes (Signed)
Chief Complaint: Patient was seen in consultation today for TIPS follow up.    Referring Physician(s): Beavers, Spero Curb, Dawn  History of Present Illness: [name] is a 70 y.o. female with history of Oxiplatin-induced noncirrhotic portal hypertension.  Patient had a TIPS procedure performed at [facility] for variceal bleeding in 2012.  I initially saw the patient on 12/20/2020 to evaluate the TIPS stent because there was a concern for stenosis.  At that time, the patient had been suffering from hepatic encephalopathy and elevated ammonia level.  Patient is accompanied by her husband today.  She is feeling better and no significant cognitive issues.  She is not complaining of respiratory issues or abdominal pain.  She has lower extremity swelling but not wearing compression stockings on a regular basis.  Not clear when the patient last had endoscopy.  No signs or symptoms of GI bleeding.  Past Medical History:  Diagnosis Date   Cancer [facility])    cecum   Elevated liver function tests    Esophageal varices (HCC)    Hemorrhage of gastrointestinal tract 05/04/2011   Hypothyroidism    Iron deficiency anemia    Liver disease    chemotherapy complication, per pt, shunts placed to bypass liver   Malignant neoplasm of cecum (HCC)    Portal hypertension (HCC)    Splenomegaly     Past Surgical History:  Procedure Laterality Date   ESOPHAGEAL VARICE LIGATION     HEMICOLECTOMY  01/08/03   IR RADIOLOGIST EVAL & MGMT  12/20/2020   IR RADIOLOGIST EVAL & MGMT  05/29/2021   LIVER SURGERY     shunts placed after chemo complication   SKIN FULL THICKNESS GRAFT N/A 09/12/2019   Procedure: debridement and FTSG to the nose from left upper arm;  Surgeon: Cindra Presume, MD;  Location: [facility];  Service: Plastics;  Laterality: N/A;  2 hours, please   TIPS PROCEDURE      Allergies: Contrast media [iodinated diagnostic agents]  Medications: Prior to Admission medications    Medication Sig Start Date End Date Taking? Authorizing Provider  Cholecalciferol (VITAMIN D3) 50 MCG (2000 UT) TABS Take 2,000 Units by mouth at bedtime.     [provider]  Elderberry 575 MG/5ML SYRP Take 15 mLs by mouth at bedtime.    [provider]  L-ORNITHINE PO Take 6 g by mouth 3 (three) times daily.    [provider]  lactulose (CHRONULAC) 10 GM/15ML solution Take 30 mLs (20 g total) by mouth 3 (three) times daily. 04/22/21   Thornton Park, MD  levOCARNitine (L-CARNITINE PO) Take by mouth daily at 12 noon.    [provider]  levothyroxine (SYNTHROID) 75 MCG tablet Take 1 tablet (75 mcg total) by mouth daily before breakfast. Needs name brand 12/04/20   Lesleigh Noe, MD  losartan (COZAAR) 100 MG tablet Take 1 tablet (100 mg total) by mouth daily. 02/19/21   Lesleigh Noe, MD  OVER THE COUNTER MEDICATION Take 12 g by mouth daily. Name: Branch chain amino acids    [provider]  vitamin B-12 (CYANOCOBALAMIN) 1000 MCG tablet Take 1,000 mcg by mouth daily.    [provider]  vitamin C (ASCORBIC ACID) 500 MG tablet Take 500 mg by mouth at bedtime.    [provider]  zinc gluconate 50 MG tablet Take 150 mg by mouth at bedtime.    [provider]     Family History  Problem  Relation Age of Onset   Arthritis Mother    Hearing loss Mother    Heart disease Mother    Hypertension Mother    Arthritis Father    Diabetes Father    Heart disease Father    Breast cancer Neg Hx    Colon cancer Neg Hx    Esophageal cancer Neg Hx    Pancreatic cancer Neg Hx    Stomach cancer Neg Hx     Social History   Socioeconomic History   Marital status: Married    Spouse name: Forensic scientist   Number of children: 2   Years of education: Not on file   Highest education level: Not on file  Occupational History   Occupation: Librarian  Tobacco Use   Smoking status: Never   Smokeless tobacco: Never  Vaping Use   Vaping  Use: Never used  Substance and Sexual Activity   Alcohol use: Never   Drug use: Never   Sexual activity: Not Currently    Partners: Male  Other Topics Concern   Not on file  Social History Narrative   12/04/20   From: the area   Living: with husband, Ozzie Hoyle (1994)   Work: Licensed conveyancer at [employer]      Family: 2 children Advertising account executive and Engineer, technical sales - 2 grandchildren - nearby      Enjoys: read      Exercise: trying to get back to exercise   Diet: healthy, limits fast foods      Safety   Seat belts: Yes    Guns: Yes  and secure   Safe in relationships: Yes    Social Determinants of Radio broadcast assistant Strain: Not on file  Food Insecurity: Not on file  Transportation Needs: Not on file  Physical Activity: Not on file  Stress: Not on file  Social Connections: Not on file    Review of Systems: A 12 point ROS discussed and pertinent positives are indicated in the HPI above.  All other systems are negative.  Review of Systems  Constitutional: Negative.   Respiratory: Negative.    Cardiovascular:  Positive for leg swelling.  Gastrointestinal: Negative.   Genitourinary: Negative.   Neurological: Negative.    Vital Signs: BP (!) 144/65 (BP Location: Right Arm)   Pulse (!) 55   SpO2 97%   Physical Exam Constitutional:      Appearance: Normal appearance.  Cardiovascular:     Rate and Rhythm: Normal rate and regular rhythm.  Pulmonary:     Effort: Pulmonary effort is normal.     Breath sounds: Normal breath sounds.  Abdominal:     General: Abdomen is flat. Bowel sounds are normal.     Palpations: Abdomen is soft.  Musculoskeletal:     Right lower leg: Edema present.     Left lower leg: Edema present.     Comments: Mild redness with scaly skin in both calves and ankle.  No obvious varicosities.  No ulcerations.  Neurological:     Mental Status: She is alert.       Imaging: US ABDOMINAL PELVIC ART/VENT FLOW DOPPLER  Result Date:  05/29/2021 CLINICAL DATA:  70 year old with history of a TIPS creation in 2012 for variceal bleeding. Follow-up TIPS stent. EXAM: DUPLEX ULTRASOUND OF LIVER AND TIPS SHUNT TECHNIQUE: Color and duplex Doppler ultrasound was performed to evaluate the hepatic in-flow and out-flow vessels. COMPARISON:  12/13/2020 and limited ultrasound 11/28/2020 FINDINGS: Portal Vein Velocities Main:  81 cm/sec Right:  46  cm/sec Left:  26 cm/sec TIPS Stent Velocities Portall:  93 cm/sec Mid:  117 Hepatic:  106 cm/sec IVC: Patent Hepatic Vein Velocities Right:  49 cm/sec Mid:  28 cm/sec Left:  35 cm/sec Splenic Vein: 41 Superior Mesenteric Vein: 107 Hepatic Artery: 93 cm/sec Ascities: Absent Varices: Absent Other findings: TIPS stent is patent. No significant velocity changes within the stent. Hepatofugal flow in the hepatic veins. Main portal vein is patent with hepatopetal flow. Limited evaluation of the left portal vein. Gallbladder is decompressed with mild wall thickening measuring 3-4 mm. Echogenic gallstone measuring up to 1.0 cm. Common bile duct measures 5 mm. Configuration of the gallbladder fundus is slightly irregular and patient may have a phrygian cap. IMPRESSION: 1. TIPS stent remains patent. Stent velocities have not significantly changed and no evidence for significant stenosis. 2. Cholelithiasis. Gallbladder is slightly decompressed compared to the exam on 11/28/2020. Mild wall thickening could be related to under distension. Unusual appearance of the gallbladder fundus and possibly related to phrygian cap. Reportedly, the patient is scheduled to have a liver MRI in the near future and recommend attention to this area on follow up imaging. Electronically Signed   By: Markus Daft M.D.   On: 05/29/2021 12:08   IR Radiologist Eval & Mgmt  Result Date: 05/29/2021 Please refer to notes tab for details about interventional procedure. (Op Note)  US Abdomen Limited RUQ (LIVER/GB)  Result Date: 05/19/2021 CLINICAL DATA:   Cirrhosis. Hepatic encephalopathy. Patient with history of TIPS EXAM: ULTRASOUND ABDOMEN LIMITED RIGHT UPPER QUADRANT COMPARISON:  Abdominal ultrasound 11/28/2020 FINDINGS: Gallbladder: Physiologically distended. Multiple shadowing gallstones. There is the round echogenic 1.5 x 1.1 x 1.6 cm structure that appears to be intraluminal with some peripheral flow about the fundus. No gallbladder wall thickening. No sonographic Murphy sign noted by sonographer. Common bile duct: Diameter: 3 mm. Liver: Heterogeneous hepatic parenchyma. Subtle capsular nodularity. No focal lesion. Tips in place that is not fully evaluated on this exam. There is normal direction of blood flow in the visualized main portal vein near the porta hepatis. Other: No right upper quadrant ascites. IMPRESSION: 1. Rounded 1.5 x 1.1 x 1.6 cm echogenic structure within the gallbladder fundus with some vascularity. This appears to be intraluminal within the gallbladder rather than exophytic from the adjacent liver. This was not seen on prior exam. Recommend further assessment with MRCP. 2. Subtle hepatic capsular nodularity consistent with cirrhosis. No focal hepatic lesion. 3. Gallstones.  No biliary dilatation. 4. TIPS in place, not evaluated on the current exam. Electronically Signed   By: Keith Rake M.D.   On: 05/19/2021 18:03    Labs:  CBC: Recent Labs    08/17/20 0135 09/02/20 1538 11/27/20 2057 11/30/20 0421  WBC 5.0 4.9 3.3* 2.2*  HGB 12.4 11.8* 12.6 11.6*  HCT 37.3 35.2* 39.1 36.1  PLT 265 145.0* 104* 69*    COAGS: Recent Labs    08/13/20 0445  INR 1.2    BMP: Recent Labs    08/14/20 0745 08/15/20 0600 08/16/20 1149 08/17/20 0135 09/02/20 1538 11/27/20 2057 11/30/20 0421  NA 140 144 142 141 141 138 142  K 3.3* 3.8 3.9 3.6 4.4 3.9 4.1  CL 107 109 108 106 106 101 105  CO2 20* 21* 20* 24 29 24 28   GLUCOSE 158* 114* 103* 184* 100* 89 74  BUN 13 17 21 21 11 22 13   CALCIUM 7.9* 8.6* 8.5* 8.3* 9.1 10.3 9.4   CREATININE 0.77 0.77 0.79 0.77 0.75 0.90  0.74  GFRNONAA >60 >60 >60 >60  --  >60 >60  GFRAA >60 >60 >60 >60  --   --   --     LIVER FUNCTION TESTS: Recent Labs    08/17/20 0135 09/02/20 1538 11/27/20 2057 11/30/20 0421  BILITOT 1.2 0.9 1.6* 1.8*  AST 92* 29 96* 70*  ALT 46* 25 58* 45*  ALKPHOS 213* 164* 172* 146*  PROT 6.0* 6.4 7.2 6.1*  ALBUMIN 2.4* 3.2* 3.6 2.8*    TUMOR MARKERS: No results for input(s): AFPTM, CEA, CA199, CHROMGRNA in the last 8760 hours.  Assessment and Plan:  TIPS management: 70 year old female with noncirrhotic portal hypertension and status post TIPS  procedure in 2012 for variceal bleeding.  Today's ultrasound confirms that the stent remains patent.  The velocities have not significantly changed in 6 months.  Patient has no evidence for ascites and has had no symptoms for variceal bleeding.  At this point, the TIPS stent appears to be working fine.  Patient is getting regular follow-up with GI and recommend annual ultrasound follow up.  In addition, the patient is scheduled have an MRI in the near future.  Today's ultrasound demonstrated cholelithiasis with mild irregularity at the gallbladder fundus.  This area could be further evaluated with the upcoming MRI.  Lower extremity edema: Lower extremity venous stasis and the patient could have underlying venous insufficiency.  Recommend wearing lower extremity compression stockings and keep the feet elevated when possible.  If the patient's lower extremity swelling progresses, we could do a formal venous insufficiency work-up in the future.   Thank you for this interesting consult.  I greatly enjoyed meeting Shahed Yeoman and look forward to participating in their care.  A copy of this report was sent to the requesting provider on this date.  Electronically Signed: Burman Riis 05/29/2021, 12:52 PM   I spent a total of    15 Minutes in face to face in clinical consultation, greater than 50% of which was  counseling/coordinating care for TIPS management.   patient ID: Annetta Maw, female   DOB: 1951/05/18, 70 y.o.   MRN: [id number]

## 2021-06-02 ENCOUNTER — Other Ambulatory Visit: Payer: Self-pay

## 2021-06-02 ENCOUNTER — Encounter: Payer: Self-pay | Admitting: Family Medicine

## 2021-06-02 DIAGNOSIS — E039 Hypothyroidism, unspecified: Secondary | ICD-10-CM

## 2021-06-02 MED ORDER — LEVOTHYROXINE SODIUM 75 MCG PO TABS: 75.0000 ug | ORAL_TABLET | Freq: Every day | ORAL | 1 refills | Status: DC

## 2021-06-09 ENCOUNTER — Ambulatory Visit
Admission: RE | Admit: 2021-06-09 | Discharge: 2021-06-09 | Disposition: A | Discharge status: Home / Self Care | Payer: Medicare Other | Source: Ambulatory Visit | Attending: Nurse Practitioner | Admitting: Nurse Practitioner

## 2021-06-09 DIAGNOSIS — D376 Neoplasm of uncertain behavior of liver, gallbladder and bile ducts: Secondary | ICD-10-CM

## 2021-06-09 DIAGNOSIS — K828 Other specified diseases of gallbladder: Secondary | ICD-10-CM | POA: Diagnosis not present

## 2021-06-09 MED ORDER — GADOBENATE DIMEGLUMINE 529 MG/ML IV SOLN
17.0000 mL | Freq: Once | INTRAVENOUS | Status: AC | PRN
  Administered 2021-06-09: 17 mL via INTRAVENOUS

## 2021-07-03 DIAGNOSIS — K7682 Hepatic encephalopathy: Secondary | ICD-10-CM

## 2021-07-03 DIAGNOSIS — K766 Portal hypertension: Secondary | ICD-10-CM

## 2021-07-03 DIAGNOSIS — K7290 Hepatic failure, unspecified without coma: Secondary | ICD-10-CM

## 2021-07-03 DIAGNOSIS — R748 Abnormal levels of other serum enzymes: Secondary | ICD-10-CM

## 2021-07-04 MED ORDER — LACTULOSE 10 GM/15ML PO SOLN: 20.0000 g | Freq: Three times a day (TID) | ORAL | 0 refills | Status: DC

## 2021-07-16 ENCOUNTER — Other Ambulatory Visit: Payer: Self-pay

## 2021-07-16 DIAGNOSIS — R748 Abnormal levels of other serum enzymes: Secondary | ICD-10-CM

## 2021-07-16 DIAGNOSIS — K7682 Hepatic encephalopathy: Secondary | ICD-10-CM

## 2021-07-16 DIAGNOSIS — K766 Portal hypertension: Secondary | ICD-10-CM

## 2021-07-16 DIAGNOSIS — K7290 Hepatic failure, unspecified without coma: Secondary | ICD-10-CM

## 2021-07-16 MED ORDER — LACTULOSE 10 GM/15ML PO SOLN: 20.0000 g | Freq: Three times a day (TID) | ORAL | 11 refills | Status: DC

## 2021-07-23 DIAGNOSIS — Z08 Encounter for follow-up examination after completed treatment for malignant neoplasm: Secondary | ICD-10-CM | POA: Diagnosis not present

## 2021-07-23 DIAGNOSIS — L570 Actinic keratosis: Secondary | ICD-10-CM | POA: Diagnosis not present

## 2021-07-23 DIAGNOSIS — L821 Other seborrheic keratosis: Secondary | ICD-10-CM | POA: Diagnosis not present

## 2021-07-23 DIAGNOSIS — Z85828 Personal history of other malignant neoplasm of skin: Secondary | ICD-10-CM | POA: Diagnosis not present

## 2021-07-23 DIAGNOSIS — X32XXXA Exposure to sunlight, initial encounter: Secondary | ICD-10-CM | POA: Diagnosis not present

## 2021-08-08 ENCOUNTER — Ambulatory Visit: Payer: Medicare Other | Admitting: Physician Assistant

## 2021-08-18 ENCOUNTER — Other Ambulatory Visit: Payer: Self-pay | Admitting: Family Medicine

## 2021-08-18 DIAGNOSIS — Z1231 Encounter for screening mammogram for malignant neoplasm of breast: Secondary | ICD-10-CM

## 2021-09-10 ENCOUNTER — Ambulatory Visit
Admission: RE | Admit: 2021-09-10 | Discharge: 2021-09-10 | Disposition: A | Discharge status: Home / Self Care | Payer: Medicare Other | Source: Ambulatory Visit | Attending: Family Medicine | Admitting: Family Medicine

## 2021-09-10 ENCOUNTER — Other Ambulatory Visit: Payer: Self-pay

## 2021-09-10 DIAGNOSIS — Z1231 Encounter for screening mammogram for malignant neoplasm of breast: Secondary | ICD-10-CM

## 2021-09-19 ENCOUNTER — Ambulatory Visit: Payer: Medicare Other | Admitting: Gastroenterology

## 2021-10-02 ENCOUNTER — Telehealth: Payer: Self-pay | Admitting: Family Medicine

## 2021-10-02 NOTE — Chronic Care Management (AMB) (Signed)
  Chronic Care Management   Outreach Note  10/02/2021 Name: [name] MRN: [id number] DOB: [birth year]  Referred by: Lesleigh Noe, MD Reason for referral : No chief complaint on file.   An unsuccessful telephone outreach was attempted today. The patient was referred to the pharmacist for assistance with care management and care coordination.   Follow Up Plan:   Tatjana Dellinger Upstream Scheduler

## 2021-10-06 ENCOUNTER — Telehealth: Payer: Self-pay | Admitting: Family Medicine

## 2021-10-06 NOTE — Chronic Care Management (AMB) (Signed)
  Chronic Care Management   Outreach Note  10/06/2021 Name: [name] MRN: [id number] DOB: [birth year]  Referred by: Lesleigh Noe, MD Reason for referral : No chief complaint on file.   A second unsuccessful telephone outreach was attempted today. The patient was referred to pharmacist for assistance with care management and care coordination.  Follow Up Plan:   Tatjana Dellinger Upstream Scheduler

## 2021-10-10 ENCOUNTER — Ambulatory Visit: Payer: Medicare Other | Admitting: Gastroenterology

## 2021-10-10 ENCOUNTER — Telehealth: Payer: Self-pay | Admitting: Family Medicine

## 2021-10-10 NOTE — Progress Notes (Signed)
  Chronic Care Management   Note  10/10/2021 Name: [name] MRN: [id number] DOB: 06/20/1951  [name] is a 70 y.o. year old female who is a primary care patient of Einar Pheasant Jobe Marker, MD. I reached out to Annetta Maw by phone today in response to a referral sent by [name]'s PCP, Katherine Noe, MD.   [name] was given information about Chronic Care Management services today including:  CCM service includes personalized support from designated clinical staff supervised by her physician, including individualized plan of care and coordination with other care providers 24/7 contact phone numbers for assistance for urgent and routine care needs. Service will only be billed when office clinical staff spend 20 minutes or more in a month to coordinate care. Only one practitioner may furnish and bill the service in a calendar month. The patient may stop CCM services at any time (effective at the end of the month) by phone call to the office staff.   Patient agreed to services and verbal consent obtained.   Follow up plan:   Tatjana Secretary/administrator

## 2021-11-07 ENCOUNTER — Ambulatory Visit: Payer: Medicare Other | Admitting: Gastroenterology

## 2021-11-10 ENCOUNTER — Other Ambulatory Visit: Payer: Self-pay | Admitting: Nurse Practitioner

## 2021-11-10 DIAGNOSIS — K7682 Hepatic encephalopathy: Secondary | ICD-10-CM | POA: Diagnosis not present

## 2021-11-10 DIAGNOSIS — R634 Abnormal weight loss: Secondary | ICD-10-CM | POA: Diagnosis not present

## 2021-11-10 DIAGNOSIS — K7469 Other cirrhosis of liver: Secondary | ICD-10-CM

## 2021-11-10 DIAGNOSIS — I8510 Secondary esophageal varices without bleeding: Secondary | ICD-10-CM | POA: Diagnosis not present

## 2021-11-24 ENCOUNTER — Other Ambulatory Visit: Payer: Self-pay | Admitting: Family Medicine

## 2021-11-24 DIAGNOSIS — E039 Hypothyroidism, unspecified: Secondary | ICD-10-CM

## 2021-11-25 ENCOUNTER — Telehealth: Payer: Self-pay

## 2021-11-25 ENCOUNTER — Other Ambulatory Visit: Payer: Self-pay

## 2021-11-25 ENCOUNTER — Ambulatory Visit (INDEPENDENT_AMBULATORY_CARE_PROVIDER_SITE_OTHER): Payer: Medicare Other | Admitting: Pharmacist

## 2021-11-25 DIAGNOSIS — M8589 Other specified disorders of bone density and structure, multiple sites: Secondary | ICD-10-CM

## 2021-11-25 DIAGNOSIS — I10 Essential (primary) hypertension: Secondary | ICD-10-CM

## 2021-11-25 DIAGNOSIS — K7469 Other cirrhosis of liver: Secondary | ICD-10-CM

## 2021-11-25 DIAGNOSIS — E039 Hypothyroidism, unspecified: Secondary | ICD-10-CM

## 2021-11-25 NOTE — Progress Notes (Signed)
Chronic Care Management Pharmacy Assistant   Name: [name]  MRN: [id number] DOB: 11-May-1951  Reason for Encounter: CCM (Initial Questions)   Recent office visits:  Most recent office visit - 01/30/2021 - Waunita Schooner, MD - Patient presented for Annual Wellness Visit. Stop due to medication cost: XIFAXAN 550 MG TABS tablet.   Recent consult visits:  11/10/2021 - Roosevelt Locks, NP - [facility] - Patient presented for other cirrhosis of liver, hepatic encephalopathy and secondary esophageal varices without bleeding and weight loss. Procedure: US Abdomen Limited. No other information.  07/23/2021 - Allyn Kenner - Dermatology - Patient presented for history of other malignant neoplasm of skin, seborrheic keratosis. Procedure: PR Destruc Premal, first lesion. No other information.   Hospital visits:  None in previous 6 months  Medications: Outpatient Encounter Medications as of 11/25/2021  Medication Sig   Cholecalciferol (VITAMIN D3) 50 MCG (2000 UT) TABS Take 2,000 Units by mouth at bedtime.    Elderberry 575 MG/5ML SYRP Take 15 mLs by mouth at bedtime.   L-ORNITHINE PO Take 6 g by mouth 3 (three) times daily.   lactulose (CHRONULAC) 10 GM/15ML solution Take 30 mLs (20 g total) by mouth 3 (three) times daily.   levOCARNitine (L-CARNITINE PO) Take by mouth daily at 12 noon.   levothyroxine (SYNTHROID) 75 MCG tablet Take 1 tablet (75 mcg total) by mouth daily before breakfast. NEEDS NAME BRAND   losartan (COZAAR) 100 MG tablet Take 1 tablet (100 mg total) by mouth daily.   OVER THE COUNTER MEDICATION Take 12 g by mouth daily. Name: Branch chain amino acids   vitamin B-12 (CYANOCOBALAMIN) 1000 MCG tablet Take 1,000 mcg by mouth daily.   vitamin C (ASCORBIC ACID) 500 MG tablet Take 500 mg by mouth at bedtime.   zinc gluconate 50 MG tablet Take 150 mg by mouth at bedtime.   No facility-administered encounter medications on file as of 11/25/2021.   Lab Results   Component Value Date/Time   HGBA1C 5.1 08/14/2020 07:45 AM    BP Readings from Last 3 Encounters:  05/29/21 (!) 144/65  01/30/21 (!) 82/50  12/05/20 (!) 90/52    Patient contacted to review initial questions prior to visit with Charlene Brooke.  Have you seen any other providers since your last visit with PCP? Yes  Any changes in your medications or health? No  Any side effects from any medications? No  Do you have an symptoms or problems not managed by your medications? No  Any concerns about your health right now? No  Has your provider asked that you check blood pressure, blood sugar, or follow special diet at home? Yes - Blood Pressure. Patient will have her blood pressure log to give to [facility] during their phone call.   Do you get any type of exercise on a regular basis? Yes - Patient walks 0.5 miles every day  Can you think of a goal you would like to reach for your health? Yes - Would like to get off blood pressure medication.   Do you have any problems getting your medications? No  Is there anything that you would like to discuss during the appointment? Yes - Patient does not feel being on blood pressure medication is good for her.   Spoke with patient and reminded them to have all medications, supplements and any blood glucose and blood pressure readings available for review with pharmacist, at their telephone visit on 11/25/2021 at 3:00.  Star Rating Drugs:  Medication:  Last Fill: Day Supply Losartan 100 mg 11/22/2021 90  Care Gaps: Annual wellness visit in last year? Yes 01/30/2021 Most Recent BP reading: 144/65 on 05/29/2021   Charlene Brooke, CPP notified  Marijean Niemann, [place] Clinical Pharmacy Assistant [phone]  Time Spent:  40 Minutes

## 2021-11-25 NOTE — Patient Instructions (Signed)
Visit Information  Phone number for Pharmacist: [phone]  Thank you for meeting with me to discuss your medications! I look forward to working with you to achieve your health care goals. Below is a summary of what we talked about during the visit:   Goals Addressed             This Visit's Progress    Track and Manage My Blood Pressure-Hypertension       Timeframe:  Long-Range Goal Priority:  Medium Start Date:     11/25/20                        Expected End Date:  11/25/21                     Follow Up Date July 2023   - check blood pressure daily - choose a place to take my blood pressure (home, clinic or office, retail store) - write blood pressure results in a log or diary    Why is this important?   You won't feel high blood pressure, but it can still hurt your blood vessels.  High blood pressure can cause heart or kidney problems. It can also cause a stroke.  Making lifestyle changes like losing a little weight or eating less salt will help.  Checking your blood pressure at home and at different times of the day can help to control blood pressure.  If the doctor prescribes medicine remember to take it the way the doctor ordered.  Call the office if you cannot afford the medicine or if there are questions about it.     Notes:         Care Plan : [place]  Updates made by Charlton Haws, RPH since 11/25/2021 12:00 AM     Problem: Hypertension, Hypothyroidism, and Osteopenia, Cirrhosis of liver   Priority: High     Long-Range Goal: Disease mgmt   Start Date: 11/25/2021  Expected End Date: 11/25/2022  This Visit's Progress: On track  Priority: High  Note:   Current Barriers:  Unable to independently monitor therapeutic efficacy  Pharmacist Clinical Goal(s):  Patient will achieve adherence to monitoring guidelines and medication adherence to achieve therapeutic efficacy through collaboration with PharmD and provider.   Interventions: 1:1  collaboration with Lesleigh Noe, MD regarding development and update of comprehensive plan of care as evidenced by provider attestation and co-signature Inter-disciplinary care team collaboration (see longitudinal plan of care) Comprehensive medication review performed; medication list updated in electronic medical record  Hypertension (BP goal <130/80) -Controlled - pt reports she and her husband reduced losartan to 1/2 tablet last week after recording several DBP in 40s-50s; pt did not endorse dizziness or lightheadedness with these readings but she also has encephalopathy and does not always tell her husband how she is feeling; husband reports cutting pill in half is somewhat difficult -Current home readings: 12/20: 135/63 12/21: 129/61 12/26: 102/44; 91/36; 128/57; 123/56 12/27 - cut losartan in half 12/28-now: 120s/70s -Current treatment: Losartan 100 mg daily HS - cut back to 1/2 tab last week - Appropriate, Effective, Safe, Accessible -Current dietary habits: water only - 8 oz AM, 2 bottles of 16 oz during day,  -Current exercise habits: walking daily w/ husband -Educated on BP goals and benefits of medications for prevention of heart attack, stroke and kidney damage;Daily salt intake goal < 2300 mg; Importance of home blood pressure monitoring; Symptoms of hypotension  and importance of maintaining adequate hydration; -Counseled to monitor BP at home daily, document, and provide log at future appointments -Recommend to change losartan to 50 mg dose to avoid pill cutting  Cirrhosis of liver (Goal: prevent complications) -Controlled - pt reports improvement in encephalopathy symptoms since increasing protein in her diet up to ~90 g/day -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 MELD 11. CPT B -hepatic encephalopathy s/p covid-19 infection 07/2020, hospitalized for same Jan 2022 -Current treatment  Lactulose 10 gm/15 mL - 45 ml TID goal 3-4 BM daily - Appropriate,  Effective, Safe, Accessible Branch chain amino acids - not taking Zinc 50 mg TID L-carnitine 330 mg TID L-ornithine daily PM -Medications previously tried: Nurse, mental health (cost prohibitive - may consider [place] in future)  -Recommended to continue current medication  Hypothyroidism (Goal: maintain TSH in goal range) -Controlled - pt takes 1 hour before other meds -Current treatment  Synthroid 75 mcg daily AM - Appropriate, Effective, Safe, Accessible -Recommended to continue current medication  Osteopenia (Goal prevent fractures) -Controlled - pt walks daily -Last DEXA Scan: 07/10/2019 Tor Netters, NP)  T-Score femoral neck: -1.3  T-Score total hip: -1.3  T-Score lumbar spine: +0.1  10-year probability of major osteoporotic fracture: 7.9%  10-year probability of hip fracture: 0.8% -Patient is not a candidate for pharmacologic treatment -Current treatment  Vitamin D 2000 IU daily -Recommend weight-bearing and muscle strengthening exercises for building and maintaining bone density. -Recommend repeat DEXA scan in the next year  Health Maintenance -Vaccine gaps: Prevnar, Flu, Shingrix, Covid, TDAP. Pt declines vaccines. -Current therapy:  Elderberry HS  Vitamin B12 1000 mcg AM Vitamin C 500 mg - 2 am, 1 HS -Patient is satisfied with current therapy and denies issues -Recommended to continue current medication  Patient Goals/Self-Care Activities Patient will:  - take medications as prescribed as evidenced by patient report and record review focus on medication adherence by routine check blood pressure daily, document, and provide at future appointments engage in dietary modifications by consuming 90g of protein daily      Ms. Hankey was given information about Chronic Care Management services today including:  CCM service includes personalized support from designated clinical staff supervised by her physician, including individualized plan of care and coordination  with other care providers 24/7 contact phone numbers for assistance for urgent and routine care needs. Standard insurance, coinsurance, copays and deductibles apply for chronic care management only during months in which we provide at least 20 minutes of these services. Most insurances cover these services at 100%, however patients may be responsible for any copay, coinsurance and/or deductible if applicable. This service may help you avoid the need for more expensive face-to-face services. Only one practitioner may furnish and bill the service in a calendar month. The patient may stop CCM services at any time (effective at the end of the month) by phone call to the office staff.  Patient agreed to services and verbal consent obtained.   Patient verbalizes understanding of instructions provided today and agrees to view in [place].  Telephone follow up appointment with pharmacy team member scheduled for: 6 months  Charlene Brooke, PharmD, [facility] Clinical Pharmacist [facility] [phone]

## 2021-11-25 NOTE — Progress Notes (Signed)
Chronic Care Management Pharmacy Note  11/25/2021 Name:  [name] MRN:  [id number] DOB:  March 09, 1951  Summary: -Pt reduced losartan 100 mg to 1/2 tab around 12/27 due to low BP at home (90s/40s-100s/50s); since reducing dose BP has been 120s/70s; pt reports pill cutting is difficult -Pt reports improvement in encephalopathy symptoms since increasing protein in diet -Pt is due for colonoscopy and has GI appt 12/23/21 to discuss  Recommendations/Changes made from today's visit: -Recommend changing losartan to 50 mg tablet to avoid pill cutting -Advised pt to schedule annual visit with PCP in March 2023  Plan: -[place] will call patient in 3 months for BP log -Pharmacist follow up televisit scheduled for 6 months   Subjective: [name] is an 71 y.o. year old female who is a primary patient of Cody, Jobe Marker, MD.  The CCM team was consulted for assistance with disease management and care coordination needs.    Engaged with patient by telephone for initial visit in response to provider referral for pharmacy case management and/or care coordination services.   Consent to Services:  The patient was given the following information about Chronic Care Management services today, agreed to services, and gave verbal consent: 1. CCM service includes personalized support from designated clinical staff supervised by the primary care provider, including individualized plan of care and coordination with other care providers 2. 24/7 contact phone numbers for assistance for urgent and routine care needs. 3. Service will only be billed when office clinical staff spend 20 minutes or more in a month to coordinate care. 4. Only one practitioner may furnish and bill the service in a calendar month. 5.The patient may stop CCM services at any time (effective at the end of the month) by phone call to the office staff. 6. The patient will be responsible for cost sharing (co-pay) of up to 20% of  the service fee (after annual deductible is met). Patient agreed to services and consent obtained.  Patient Care Team: Lesleigh Noe, MD as PCP - General (Family Medicine) Thornton Park, MD as Consulting Physician (Gastroenterology) Roosevelt Locks, CRNP as Nurse Practitioner (Nurse Practitioner) Charlton Haws, [facility] as Pharmacist (Pharmacist)  Recent office visits: 01/30/2021 - Waunita Schooner, MD - Patient presented for Annual Wellness Visit. Stop due to medication cost: XIFAXAN 550 MG TABS tablet.   Recent consult visits: 11/10/2021 - Roosevelt Locks, NP - [facility] - Patient presented for other cirrhosis of liver, hepatic encephalopathy and secondary esophageal varices without bleeding and weight loss. Procedure: US Abdomen Limited. Pt is poor candidate for transplant d/t hx of colon cancer and age. Rec'd labs, MELD evaluation and colonoscopy.   07/23/2021 - Allyn Kenner - Dermatology - Patient presented for history of other malignant neoplasm of skin, seborrheic keratosis. Procedure: PR Destruc Premal, first lesion. No other information  Hospital visits: None in previous 6 months   Objective:  Lab Results  Component Value Date   CREATININE 0.74 11/30/2020   BUN 13 11/30/2020   GFR 81.18 09/02/2020   GFRNONAA >60 11/30/2020   GFRAA >60 08/17/2020   NA 142 11/30/2020   K 4.1 11/30/2020   CALCIUM 9.4 11/30/2020   CO2 28 11/30/2020   GLUCOSE 74 11/30/2020    Lab Results  Component Value Date/Time   HGBA1C 5.1 08/14/2020 07:45 AM   GFR 81.18 09/02/2020 03:38 PM   GFR 80.36 03/19/2020 04:05 PM    Last diabetic Eye exam: No results found  for: HMDIABEYEEXA  Last diabetic Foot exam: No results found for: HMDIABFOOTEX   Lab Results  Component Value Date   CHOL 159 09/14/2018   HDL 73.60 09/14/2018   LDLCALC 77 09/14/2018   TRIG 83 08/12/2020   CHOLHDL 2 09/14/2018    Hepatic Function Latest Ref Rng & Units 11/30/2020 11/27/2020 09/02/2020  Total  Protein 6.5 - 8.1 g/dL 6.1(L) 7.2 6.4  Albumin 3.5 - 5.0 g/dL 2.8(L) 3.6 3.2(L)  AST 15 - 41 U/L 70(H) 96(H) 29  ALT 0 - 44 U/L 45(H) 58(H) 25  Alk Phosphatase 38 - 126 U/L 146(H) 172(H) 164(H)  Total Bilirubin 0.3 - 1.2 mg/dL 1.8(H) 1.6(H) 0.9    Lab Results  Component Value Date/Time   TSH 0.99 01/15/2021 03:31 PM   TSH 0.574 11/28/2020 10:30 AM   TSH 7.826 (H) 08/13/2020 04:45 AM   TSH 4.95 (H) 07/31/2020 03:33 PM   FREET4 0.88 01/15/2021 03:31 PM   FREET4 0.79 08/01/2020 04:36 PM    CBC Latest Ref Rng & Units 11/30/2020 11/27/2020 09/02/2020  WBC 4.0 - 10.5 K/uL 2.2(L) 3.3(L) 4.9  Hemoglobin 12.0 - 15.0 g/dL 11.6(L) 12.6 11.8(L)  Hematocrit 36.0 - 46.0 % 36.1 39.1 35.2(L)  Platelets 150 - 400 K/uL 69(L) 104(L) 145.0(L)    Lab Results  Component Value Date/Time   VD25OH 53.64 09/14/2018 04:35 PM    Clinical ASCVD: No  The ASCVD Risk score (Arnett DK, et al., 2019) failed to calculate for the following reasons:   Cannot find a previous HDL lab   Cannot find a previous total cholesterol lab    Depression screen [facility] 2/9 01/30/2021 01/30/2021 01/24/2020  Decreased Interest 0 0 0  Down, Depressed, Hopeless 0 0 0  PHQ - 2 Score 0 0 0  Altered sleeping - - 0  Tired, decreased energy - - 0  Change in appetite - - 0  Feeling bad or failure about yourself  - - 0  Trouble concentrating - - 0  Moving slowly or fidgety/restless - - 0  Suicidal thoughts - - 0  PHQ-9 Score - - 0  Difficult doing work/chores - - Not difficult at all     Social History   Tobacco Use  Smoking Status Never  Smokeless Tobacco Never   BP Readings from Last 3 Encounters:  05/29/21 (!) 144/65  01/30/21 (!) 82/50  12/05/20 (!) 90/52   Pulse Readings from Last 3 Encounters:  05/29/21 (!) 55  01/30/21 64  12/05/20 80   Wt Readings from Last 3 Encounters:  01/30/21 217 lb 12 oz (98.8 kg)  12/05/20 213 lb (96.6 kg)  12/04/20 211 lb 8 oz (95.9 kg)   BMI Readings from Last 3 Encounters:  01/30/21  37.38 kg/m  12/05/20 36.56 kg/m  12/04/20 36.30 kg/m    Assessment/Interventions: Review of patient past medical history, allergies, medications, health status, including review of consultants reports, laboratory and other test data, was performed as part of comprehensive evaluation and provision of chronic care management services.   SDOH:  (Social Determinants of Health) assessments and interventions performed: Yes  SDOH Screenings   Alcohol Screen: Not on file  Depression (PHQ2-9): Low Risk    PHQ-2 Score: 0  Financial Resource Strain: Not on file  Food Insecurity: Not on file  Housing: Not on file  Physical Activity: Not on file  Social Connections: Not on file  Stress: Not on file  Tobacco Use: Low Risk    Smoking Tobacco Use: Never   Smokeless  Tobacco Use: Never   Passive Exposure: Not on file  Transportation Needs: Not on file    [place]  Allergies  Allergen Reactions   Contrast Media [Iodinated Contrast Media] Hives    Medications Reviewed Today     Reviewed by Charlton Haws, [facility] (Pharmacist) on 11/25/21 at 1620  Med List Status: <None>   Medication Order Taking? Sig Documenting Provider Last Dose Status Informant  Cholecalciferol (VITAMIN D3) 50 MCG (2000 UT) TABS [id number] Yes Take 2,000 Units by mouth at bedtime.  [provider] Taking Active Spouse/Significant Other  Elderberry 575 MG/5ML SYRP [id number] Yes Take 15 mLs by mouth at bedtime. [provider] Taking Active Spouse/Significant Other           Med Note Leanord Asal, [place] Nov 28, 2020 10:15 AM)    L-ORNITHINE PO [id number] Yes Take 6 g by mouth daily. [provider] Taking Active   lactulose (CHRONULAC) 10 GM/15ML solution [id number] Yes Take 30 mLs (20 g total) by mouth 3 (three) times daily.  Patient taking differently: Take 30 g by mouth 3 (three) times daily.   Thornton Park, MD Taking Active   levOCARNitine (L-CARNITINE PO) [id number] Yes Take  330 mg by mouth in the morning, at noon, and at bedtime. [provider] Taking Active   levothyroxine (SYNTHROID) 75 MCG tablet [id number] Yes Take 1 tablet (75 mcg total) by mouth daily before breakfast. NEEDS NAME Alethia Berthold, Jobe Marker, MD Taking Active   losartan (COZAAR) 100 MG tablet [id number] Yes Take 1 tablet (100 mg total) by mouth daily.  Patient taking differently: Take 50 mg by mouth daily.   Lesleigh Noe, MD Taking Active   vitamin B-12 (CYANOCOBALAMIN) 1000 MCG tablet [id number] Yes Take 1,000 mcg by mouth daily. [provider] Taking Active   vitamin C (ASCORBIC ACID) 500 MG tablet [id number] Yes Take 1,500 mg by mouth at bedtime. [provider] Taking Active Spouse/Significant Other  zinc gluconate 50 MG tablet [id number] Yes Take 150 mg by mouth in the morning, at noon, and at bedtime. [provider] Taking Active Spouse/Significant Other            Patient Active Problem List   Diagnosis Date Noted   COVID-19 vaccination declined 01/30/2021   Cirrhosis of liver ([place]) 12/04/2020   Essential hypertension 08/12/2020   Murmur, cardiac 03/19/2020   Chronic pain of both knees 01/29/2020   Morbid obesity ([place]) 01/29/2020   Hypothyroidism 06/30/2019   History of basal cell cancer 06/30/2019   Hx of colon cancer, stage III 11/30/2011   Esophageal varices ([place]) 06/12/2010   Portal hypertension ([place]) 01/23/2008     There is no immunization history on file for this patient.  Conditions to be addressed/monitored:  Hypertension, Hypothyroidism, and Osteopenia, Cirrhosis of liver  Care Plan : CCM Pharmacy Care Plan  Updates made by Charlton Haws, RPH since 11/25/2021 12:00 AM     Problem: Hypertension, Hypothyroidism, and Osteopenia, Cirrhosis of liver   Priority: High     Long-Range Goal: Disease mgmt   Start Date: 11/25/2021  Expected End Date: 11/25/2022  This Visit's Progress: On track  Priority: High  Note:   Current  Barriers:  Unable to independently monitor therapeutic efficacy  Pharmacist Clinical Goal(s):  Patient will achieve adherence to monitoring guidelines and medication adherence to achieve therapeutic efficacy through collaboration with PharmD and provider.   Interventions: 1:1 collaboration with Lesleigh Noe, MD regarding development and update  of comprehensive plan of care as evidenced by provider attestation and co-signature Inter-disciplinary care team collaboration (see longitudinal plan of care) Comprehensive medication review performed; medication list updated in electronic medical record  Hypertension (BP goal <130/80) -Controlled - pt reports she and her husband reduced losartan to 1/2 tablet last week after recording several DBP in 40s-50s; pt did not endorse dizziness or lightheadedness with these readings but she also has encephalopathy and does not always tell her husband how she is feeling; husband reports cutting pill in half is somewhat difficult -Current home readings: 12/20: 135/63 12/21: 129/61 12/26: 102/44; 91/36; 128/57; 123/56 12/27 - cut losartan in half 12/28-now: 120s/70s -Current treatment: Losartan 100 mg daily HS - cut back to 1/2 tab last week - Appropriate, Effective, Safe, Accessible -Current dietary habits: water only - 8 oz AM, 2 bottles of 16 oz during day,  -Current exercise habits: walking daily w/ husband -Educated on BP goals and benefits of medications for prevention of heart attack, stroke and kidney damage;Daily salt intake goal < 2300 mg; Importance of home blood pressure monitoring; Symptoms of hypotension and importance of maintaining adequate hydration; -Counseled to monitor BP at home daily, document, and provide log at future appointments -Recommend to change losartan to 50 mg dose to avoid pill cutting  Cirrhosis of liver (Goal: prevent complications) -Controlled - pt reports improvement in encephalopathy symptoms since increasing protein  in her diet up to ~90 g/day -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 MELD 11. CPT B -hepatic encephalopathy s/p covid-19 infection 07/2020, hospitalized for same Jan 2022 -Current treatment  Lactulose 10 gm/15 mL - 45 ml TID goal 3-4 BM daily - Appropriate, Effective, Safe, Accessible Branch chain amino acids - not taking Zinc 50 mg TID L-carnitine 330 mg TID L-ornithine daily PM -Medications previously tried: Nurse, mental health (cost prohibitive - may consider [place] in future)  -Recommended to continue current medication  Hypothyroidism (Goal: maintain TSH in goal range) -Controlled - pt takes 1 hour before other meds -Current treatment  Synthroid 75 mcg daily AM - Appropriate, Effective, Safe, Accessible -Recommended to continue current medication  Osteopenia (Goal prevent fractures) -Controlled - pt walks daily -Last DEXA Scan: 07/10/2019 Tor Netters, NP)  T-Score femoral neck: -1.3  T-Score total hip: -1.3  T-Score lumbar spine: +0.1  10-year probability of major osteoporotic fracture: 7.9%  10-year probability of hip fracture: 0.8% -Patient is not a candidate for pharmacologic treatment -Current treatment  Vitamin D 2000 IU daily -Recommend weight-bearing and muscle strengthening exercises for building and maintaining bone density. -Recommend repeat DEXA scan in the next year  Health Maintenance -Vaccine gaps: Prevnar, Flu, Shingrix, Covid, TDAP. Pt declines vaccines. -Current therapy:  Elderberry HS  Vitamin B12 1000 mcg AM Vitamin C 500 mg - 2 am, 1 HS -Patient is satisfied with current therapy and denies issues -Recommended to continue current medication  Patient Goals/Self-Care Activities Patient will:  - take medications as prescribed as evidenced by patient report and record review focus on medication adherence by routine check blood pressure daily, document, and provide at future appointments engage in dietary modifications by  consuming 90g of protein daily      Medication Assistance: None required.  Patient affirms current coverage meets needs.  Compliance/Adherence/Medication fill history: Care Gaps: Colonoscopy (due 06/25/20) - appt to discuss with GI 12/23/21 Annual wellness visit in last year? Yes 01/30/2021 Most Recent BP reading: 144/65 on 05/29/2021  Star-Rating Drugs: Losartan 100 mg - LF 11/22/21 x 90 ds; PDC 97%  Patient's  preferred pharmacy is:  [facility], [place] - [address] [place] [address] Phone: [phone] Fax: [phone]  Uses pill box? No - husband sets up pills daily Pt endorses 100% compliance  We discussed: Current pharmacy is preferred with insurance plan and patient is satisfied with pharmacy services Patient decided to: Continue current medication management strategy  Care Plan and Follow Up Patient Decision:  Patient agrees to Care Plan and Follow-up.  Plan: Telephone follow up appointment with care management team member scheduled for:  6 months  Charlene Brooke, PharmD, BCACP Clinical Pharmacist [facility] at [facility] [phone]

## 2021-11-27 NOTE — Telephone Encounter (Signed)
Please set patient up for Medicare nurse visit and then CPE with Dr Einar Pheasant after 01/30/22. Thank you

## 2021-11-28 NOTE — Telephone Encounter (Signed)
Pt is scheduled 02/09/22 for CPE

## 2021-12-23 ENCOUNTER — Encounter: Payer: Self-pay | Admitting: Gastroenterology

## 2021-12-23 ENCOUNTER — Ambulatory Visit: Payer: Medicare Other | Admitting: Gastroenterology

## 2021-12-23 VITALS — BP 118/68 | HR 56 | Ht 62.25 in | Wt 181.4 lb

## 2021-12-23 DIAGNOSIS — E039 Hypothyroidism, unspecified: Secondary | ICD-10-CM

## 2021-12-23 DIAGNOSIS — K766 Portal hypertension: Secondary | ICD-10-CM | POA: Diagnosis not present

## 2021-12-23 DIAGNOSIS — K7460 Unspecified cirrhosis of liver: Secondary | ICD-10-CM

## 2021-12-23 DIAGNOSIS — K7682 Hepatic encephalopathy: Secondary | ICD-10-CM

## 2021-12-23 DIAGNOSIS — I10 Essential (primary) hypertension: Secondary | ICD-10-CM | POA: Diagnosis not present

## 2021-12-23 DIAGNOSIS — R634 Abnormal weight loss: Secondary | ICD-10-CM

## 2021-12-23 NOTE — Patient Instructions (Addendum)
PROCEDURE:  I am recommending that you have a(n) Colonoscopy completed. Per your request, my staff did not schedule the procedure(s) today. When you are ready to proceed with scheduling, please contact my office at [phone].   NOTE:   At the time of scheduling your procedure, you will also be scheduled for a pre-visit with my nurse. During this appointment, you will be provided with your prep instructions.  If you are age 71 or older, your body mass index should be between 23-30. Your Body mass index is 32.91 kg/m. If this is out of the aforementioned range listed, please consider follow up with your Primary Care Provider.   ________________________________________________________  The [facility] providers would like to encourage you to use [facility] to communicate with providers for non-urgent requests or questions.  Due to long hold times on the telephone, sending your provider a message by [facility] may be a faster and more efficient way to get a response.  Please allow 48 business hours for a response.  Please remember that this is for non-urgent requests.  _______________________________________________________ Thank you for choosing me and [facility].  Dr.Kimberly Beavers

## 2021-12-23 NOTE — Progress Notes (Signed)
Referring Provider: Lesleigh Noe, MD Primary Care Physician:  Lesleigh Noe, MD   Chief complaint:  Weight loss   IMPRESSION:  Weight loss - ? etiology    - improving on high protein diet with goal to consume 1600-2300 cal/day Cirrhosis with portal hypertension    - initially Oxiplatin-induced non-cirrhotic portal hypertension    - now with suspected cirrhosis by ultrasound, associated thrombocytopenia Hepatic encephalopathy requiring hospitalization    - Xifaxan is cost prohibitive    - Continue zinc sulfate and levocarnitine Variceal bleed 2012 s/p TIPS    - TIPS patent on recent imaging    - last EGD 2010    - [facility] recommends EGD to screen for redevelopment of varices History of stage IIIb cecal adenocarcinoma s/p surgery and FOLFAX 2004    - overdue surveillance colonoscopy   PLAN: - Continue lactulose 20g TID - Add Xifaxan 550 mg BID - Doppler abdominal ultrasound to follow-up TIPS every 6 months - Labs today to calculate a MELD score - Colonoscopy  - EGD - Follow-up at the [facility]    HPI: 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 She had a segment colectomy for stage IIIb cecal adenocarcinoma arising in the region of the IC valve in 2004 treated with [facility]. Surveillance endoscopy had been performed at both [facility] and [facility]. I had recommended surveillance colonoscopy in 2020 given the available records showing her last colonoscopy in 2015. This has not yet been performed.    Her cecal adenocarcinoma was treated with oxaliplatin complicated by oxyplatin-induced non-cirrhotic portal hypertension requiring a TIPS in 2012 at [facility] for variceal bleeding. Doppler ultrasound 11/2020 questioned a possible TIPS stenosis. Revision was not pursued due to a hospitalization for hepatic encephalopathy 1/22.   She denies jaundice, ascites, LE edema, renal insufficiency. GI  ROS is otherwise negative. Last EGD 2010.   Returns now in follow-up. She had multiple appointments that have had to be rescheduled over the last 6 months.  She had lost weight. In December, was put on a high protein diet. She is supplementing her meals with a protein shake at least daily. Her husband accompanies her to this appointment.  She is overall feeling better.  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  Walking half a mile. Trying to get up to a mile. Going to a gym.   Follows-up with [facility]. MELD 11,CPT B. Candidacy for transplant limited due to colon cancer history and advanced age. Endoscopic surveillance recommended to determine if she may be a candidate.   Doppler ultrasound 05/29/21: Patent TIPS, no velocity changes, no significant stenosis, cholelithiasis MRI with MRCP 06/09/21: gallstone, focal cystic nodularity at the gallbladder fundus , cirrhosis, TIPS, no HCC, normal spleen  Past Medical History:  Diagnosis Date   Cancer ([place])    cecum   Elevated liver function tests    Esophageal varices (HCC)    Hemorrhage of gastrointestinal tract 05/04/2011   Hypothyroidism    Iron deficiency anemia    Liver disease    chemotherapy complication, per pt, shunts placed to bypass liver   Malignant neoplasm of cecum ([place])    Portal hypertension ([place])    Splenomegaly     Past Surgical History:  Procedure Laterality Date   ESOPHAGEAL VARICE LIGATION     HEMICOLECTOMY  01/08/03   IR RADIOLOGIST EVAL &  MGMT  12/20/2020   IR RADIOLOGIST EVAL & MGMT  05/29/2021   LIVER SURGERY     shunts placed after chemo complication   SKIN FULL THICKNESS GRAFT N/A 09/12/2019   Procedure: debridement and FTSG to the nose from left upper arm;  Surgeon: Cindra Presume, MD;  Location: [facility];  Service: Plastics;  Laterality: N/A;  2 hours, please   TIPS PROCEDURE      Current Outpatient Medications  Medication Sig Dispense Refill    Cholecalciferol (VITAMIN D3) 50 MCG (2000 UT) TABS Take 2,000 Units by mouth at bedtime.      Elderberry 575 MG/5ML SYRP Take 15 mLs by mouth at bedtime.     ENULOSE 10 GM/15ML SOLN Take 30 mLs by mouth 3 (three) times daily.     L-ORNITHINE PO Take 6 g by mouth daily.     levOCARNitine (CARNITOR) 330 MG tablet Take 330 mg by mouth 3 (three) times daily.     losartan (COZAAR) 100 MG tablet Take 1 tablet (100 mg total) by mouth daily. (Patient taking differently: Take 50 mg by mouth daily.) 90 tablet 3   Multiple Vitamin (MULTI-VITAMIN) tablet Take 1 tablet by mouth daily.     SYNTHROID 75 MCG tablet TAKE 1 TABLET BY MOUTH DAILY BEFORE BREAKFAST 90 tablet 0   vitamin B-12 (CYANOCOBALAMIN) 1000 MCG tablet Take 1,000 mcg by mouth daily.     vitamin C (ASCORBIC ACID) 500 MG tablet Take 1,500 mg by mouth at bedtime.     zinc gluconate 50 MG tablet Take 150 mg by mouth in the morning, at noon, and at bedtime.     No current facility-administered medications for this visit.    Allergies as of 12/23/2021 - Review Complete 12/23/2021  Allergen Reaction Noted   Contrast media [iodinated contrast media] Hives 11/30/2011    Family History  Problem Relation Age of Onset   Arthritis Mother    Hearing loss Mother    Heart disease Mother    Hypertension Mother    Arthritis Father    Diabetes Father    Heart disease Father    Breast cancer Neg Hx    Colon cancer Neg Hx    Esophageal cancer Neg Hx    Pancreatic cancer Neg Hx    Stomach cancer Neg Hx     Social History   Socioeconomic History   Marital status: Married    Spouse name: Forensic scientist   Number of children: 2   Years of education: Not on file   Highest education level: Not on file  Occupational History   Occupation: Librarian  Tobacco Use   Smoking status: Never   Smokeless tobacco: Never  Vaping Use   Vaping Use: Never used  Substance and Sexual Activity   Alcohol use: Never   Drug use: Never   Sexual activity: Not Currently     Partners: Male  Other Topics Concern   Not on file  Social History Narrative   12/04/20   From: the area   Living: with husband, Ozzie Hoyle (1994)   Work: Licensed conveyancer at [employer]      Family: 2 children Colletta [place] and Engineer, technical sales - 2 grandchildren - nearby      Enjoys: read      Exercise: trying to get back to exercise   Diet: healthy, limits fast foods      Safety   Seat belts: Yes    Guns: Yes  and secure   Safe in relationships:  Yes    Social Determinants of Health   Financial Resource Strain: Not on file  Food Insecurity: Not on file  Transportation Needs: Not on file  Physical Activity: Not on file  Stress: Not on file  Social Connections: Not on file  Intimate Partner Violence: Not on file    Physical Exam: General:   Alert, in NAD. No scleral icterus. No bilateral temporal wasting.  Heart:  Regular rate and rhythm; no murmurs Pulm: Clear anteriorly; no wheezing Abdomen:  Soft. Central obesity. Nontender. Nondistended. Normal bowel sounds. No rebound or guarding. No fluid wave.  LAD: No inguinal or umbilical LAD Extremities:  Without edema. Neurologic:  Alert and  oriented x4;  grossly normal neurologically; no asterixis or clonus. Skin: No jaundice. Mild palmar erythema. Spider angioma on the chest wall.  Psych:  Alert and cooperative. Normal mood and affect.  I spent over 40 minutes, including in depth chart review, independent review of results, face-to-face time with the patient, coordinating care, ordering studies and medications as appropriate, and documentation.    Gwendy Boeder L. Tarri Glenn, MD, MPH [facility] 12/23/2021, 3:34 PM

## 2021-12-24 ENCOUNTER — Telehealth: Payer: Self-pay

## 2021-12-24 NOTE — Telephone Encounter (Signed)
Spoke to [place] (Product manager) and she will schedule patient's Colonoscopy as urgent. She will call him

## 2021-12-24 NOTE — Telephone Encounter (Signed)
-----   Message from Thornton Park, MD sent at 12/23/2021  4:00 PM EST ----- [name] husband had a FIT+. He is trying to get in for a colonoscopy. Would you please call him and arrange it? It doesn't necessarily have to be with me - but, he does need to schedule it!  His PCP is [facility].  KLB

## 2021-12-29 ENCOUNTER — Telehealth: Payer: Self-pay

## 2021-12-29 NOTE — Telephone Encounter (Signed)
-----   Message from Thornton Park, MD sent at 12/28/2021  9:25 PM EST ----- Please add EGD on to scheduled colonoscopy at the request of [facility] to screen for varices. Thank you.

## 2021-12-29 NOTE — Telephone Encounter (Signed)
LOV 12/23/21. Appears at the time of appt, pt declined to schedule procedures. Given [facility]'s request, called pt to f/u re: scheduling. LVM requesting returned call.

## 2021-12-30 NOTE — Telephone Encounter (Signed)
SECOND ATTEMPT: ° °LVM requesting returned call. °

## 2021-12-31 NOTE — Telephone Encounter (Signed)
FINAL ATTEMPT:  Called pt to schedule dbl per Dr. Tarri Glenn and [facility]'s request. LVM requesting returned call. Reminder created to f/u with pt at a later date.

## 2022-01-12 ENCOUNTER — Ambulatory Visit
Admission: RE | Admit: 2022-01-12 | Discharge: 2022-01-12 | Disposition: A | Discharge status: Home / Self Care | Payer: Medicare Other | Source: Ambulatory Visit | Attending: Nurse Practitioner | Admitting: Nurse Practitioner

## 2022-01-12 ENCOUNTER — Other Ambulatory Visit: Payer: Self-pay

## 2022-01-12 DIAGNOSIS — K8020 Calculus of gallbladder without cholecystitis without obstruction: Secondary | ICD-10-CM | POA: Diagnosis not present

## 2022-01-12 DIAGNOSIS — K7469 Other cirrhosis of liver: Secondary | ICD-10-CM

## 2022-01-12 DIAGNOSIS — K7460 Unspecified cirrhosis of liver: Secondary | ICD-10-CM | POA: Diagnosis not present

## 2022-01-13 NOTE — Telephone Encounter (Signed)
Recall previously placed to follow up again in the future.

## 2022-01-13 NOTE — Telephone Encounter (Signed)
To date, pt still has not yet scheduled her colonoscopy and EGD. Letter mailed reminding pt to call to schedule procedures.

## 2022-02-02 ENCOUNTER — Ambulatory Visit
Admission: RE | Admit: 2022-02-02 | Discharge: 2022-02-02 | Disposition: A | Discharge status: Home / Self Care | Payer: Medicare Other | Source: Ambulatory Visit | Attending: Emergency Medicine | Admitting: Emergency Medicine

## 2022-02-02 ENCOUNTER — Emergency Department (HOSPITAL_COMMUNITY): Payer: Medicare Other

## 2022-02-02 ENCOUNTER — Inpatient Hospital Stay (HOSPITAL_COMMUNITY)
Admission: EM | Admit: 2022-02-02 | Discharge: 2022-02-05 | DRG: 441 | Disposition: A | Discharge status: Home / Self Care | Payer: Medicare Other | Attending: Student | Admitting: Student

## 2022-02-02 ENCOUNTER — Encounter (HOSPITAL_COMMUNITY): Payer: Self-pay

## 2022-02-02 ENCOUNTER — Other Ambulatory Visit: Payer: Self-pay

## 2022-02-02 ENCOUNTER — Telehealth: Payer: Self-pay | Admitting: Family Medicine

## 2022-02-02 VITALS — BP 164/85 | HR 50 | Temp 97.6°F | Resp 18

## 2022-02-02 DIAGNOSIS — R0602 Shortness of breath: Secondary | ICD-10-CM | POA: Diagnosis not present

## 2022-02-02 DIAGNOSIS — Z6830 Body mass index (BMI) 30.0-30.9, adult: Secondary | ICD-10-CM

## 2022-02-02 DIAGNOSIS — Z9049 Acquired absence of other specified parts of digestive tract: Secondary | ICD-10-CM

## 2022-02-02 DIAGNOSIS — R059 Cough, unspecified: Secondary | ICD-10-CM | POA: Diagnosis not present

## 2022-02-02 DIAGNOSIS — I864 Gastric varices: Secondary | ICD-10-CM | POA: Diagnosis not present

## 2022-02-02 DIAGNOSIS — B9620 Unspecified Escherichia coli [E. coli] as the cause of diseases classified elsewhere: Secondary | ICD-10-CM | POA: Diagnosis present

## 2022-02-02 DIAGNOSIS — Z2821 Immunization not carried out because of patient refusal: Secondary | ICD-10-CM | POA: Diagnosis not present

## 2022-02-02 DIAGNOSIS — Z91041 Radiographic dye allergy status: Secondary | ICD-10-CM | POA: Diagnosis not present

## 2022-02-02 DIAGNOSIS — D61818 Other pancytopenia: Secondary | ICD-10-CM

## 2022-02-02 DIAGNOSIS — I10 Essential (primary) hypertension: Secondary | ICD-10-CM | POA: Diagnosis not present

## 2022-02-02 DIAGNOSIS — K766 Portal hypertension: Secondary | ICD-10-CM | POA: Diagnosis not present

## 2022-02-02 DIAGNOSIS — Z85038 Personal history of other malignant neoplasm of large intestine: Secondary | ICD-10-CM | POA: Diagnosis present

## 2022-02-02 DIAGNOSIS — I517 Cardiomegaly: Secondary | ICD-10-CM | POA: Diagnosis not present

## 2022-02-02 DIAGNOSIS — R600 Localized edema: Secondary | ICD-10-CM

## 2022-02-02 DIAGNOSIS — Z79899 Other long term (current) drug therapy: Secondary | ICD-10-CM

## 2022-02-02 DIAGNOSIS — K7682 Hepatic encephalopathy: Principal | ICD-10-CM | POA: Diagnosis present

## 2022-02-02 DIAGNOSIS — R062 Wheezing: Secondary | ICD-10-CM | POA: Diagnosis not present

## 2022-02-02 DIAGNOSIS — K7030 Alcoholic cirrhosis of liver without ascites: Secondary | ICD-10-CM | POA: Diagnosis not present

## 2022-02-02 DIAGNOSIS — N390 Urinary tract infection, site not specified: Secondary | ICD-10-CM

## 2022-02-02 DIAGNOSIS — R001 Bradycardia, unspecified: Secondary | ICD-10-CM | POA: Diagnosis present

## 2022-02-02 DIAGNOSIS — T451X5A Adverse effect of antineoplastic and immunosuppressive drugs, initial encounter: Secondary | ICD-10-CM | POA: Diagnosis not present

## 2022-02-02 DIAGNOSIS — Z9189 Other specified personal risk factors, not elsewhere classified: Secondary | ICD-10-CM | POA: Diagnosis not present

## 2022-02-02 DIAGNOSIS — J069 Acute upper respiratory infection, unspecified: Secondary | ICD-10-CM | POA: Diagnosis not present

## 2022-02-02 DIAGNOSIS — K7460 Unspecified cirrhosis of liver: Secondary | ICD-10-CM | POA: Diagnosis present

## 2022-02-02 DIAGNOSIS — E039 Hypothyroidism, unspecified: Secondary | ICD-10-CM | POA: Diagnosis present

## 2022-02-02 DIAGNOSIS — N3000 Acute cystitis without hematuria: Secondary | ICD-10-CM | POA: Diagnosis not present

## 2022-02-02 DIAGNOSIS — E8770 Fluid overload, unspecified: Secondary | ICD-10-CM | POA: Diagnosis present

## 2022-02-02 DIAGNOSIS — I495 Sick sinus syndrome: Secondary | ICD-10-CM

## 2022-02-02 DIAGNOSIS — R9431 Abnormal electrocardiogram [ECG] [EKG]: Secondary | ICD-10-CM | POA: Diagnosis not present

## 2022-02-02 DIAGNOSIS — G8929 Other chronic pain: Secondary | ICD-10-CM | POA: Diagnosis not present

## 2022-02-02 DIAGNOSIS — Z79890 Hormone replacement therapy (postmenopausal): Secondary | ICD-10-CM | POA: Diagnosis not present

## 2022-02-02 DIAGNOSIS — G9341 Metabolic encephalopathy: Secondary | ICD-10-CM | POA: Diagnosis not present

## 2022-02-02 DIAGNOSIS — E669 Obesity, unspecified: Secondary | ICD-10-CM | POA: Diagnosis present

## 2022-02-02 DIAGNOSIS — K8020 Calculus of gallbladder without cholecystitis without obstruction: Secondary | ICD-10-CM | POA: Diagnosis present

## 2022-02-02 DIAGNOSIS — G9340 Encephalopathy, unspecified: Secondary | ICD-10-CM | POA: Diagnosis not present

## 2022-02-02 DIAGNOSIS — K7469 Other cirrhosis of liver: Secondary | ICD-10-CM | POA: Diagnosis not present

## 2022-02-02 DIAGNOSIS — E876 Hypokalemia: Secondary | ICD-10-CM

## 2022-02-02 DIAGNOSIS — Z9221 Personal history of antineoplastic chemotherapy: Secondary | ICD-10-CM | POA: Diagnosis not present

## 2022-02-02 DIAGNOSIS — Z20822 Contact with and (suspected) exposure to covid-19: Secondary | ICD-10-CM | POA: Diagnosis not present

## 2022-02-02 DIAGNOSIS — Z8249 Family history of ischemic heart disease and other diseases of the circulatory system: Secondary | ICD-10-CM

## 2022-02-02 DIAGNOSIS — R609 Edema, unspecified: Secondary | ICD-10-CM | POA: Diagnosis not present

## 2022-02-02 DIAGNOSIS — R079 Chest pain, unspecified: Secondary | ICD-10-CM | POA: Diagnosis not present

## 2022-02-02 LAB — CBC WITH DIFFERENTIAL/PLATELET
Abs Immature Granulocytes: 0.01 10*3/uL (ref 0.00–0.07)
Basophils Absolute: 0 10*3/uL (ref 0.0–0.1)
Basophils Relative: 0 %
Eosinophils Absolute: 0.1 10*3/uL (ref 0.0–0.5)
Eosinophils Relative: 3 %
HCT: 35.4 % — ABNORMAL LOW (ref 36.0–46.0)
Hemoglobin: 11.5 g/dL — ABNORMAL LOW (ref 12.0–15.0)
Immature Granulocytes: 0 %
Lymphocytes Relative: 18 %
Lymphs Abs: 0.6 10*3/uL — ABNORMAL LOW (ref 0.7–4.0)
MCH: 34.5 pg — ABNORMAL HIGH (ref 26.0–34.0)
MCHC: 32.5 g/dL (ref 30.0–36.0)
MCV: 106.3 fL — ABNORMAL HIGH (ref 80.0–100.0)
Monocytes Absolute: 0.2 10*3/uL (ref 0.1–1.0)
Monocytes Relative: 5 %
Neutro Abs: 2.3 10*3/uL (ref 1.7–7.7)
Neutrophils Relative %: 74 %
Platelets: 81 10*3/uL — ABNORMAL LOW (ref 150–400)
RBC: 3.33 MIL/uL — ABNORMAL LOW (ref 3.87–5.11)
RDW: 16.7 % — ABNORMAL HIGH (ref 11.5–15.5)
WBC: 3.1 10*3/uL — ABNORMAL LOW (ref 4.0–10.5)
nRBC: 0 % (ref 0.0–0.2)

## 2022-02-02 LAB — URINALYSIS, ROUTINE W REFLEX MICROSCOPIC
Bilirubin Urine: NEGATIVE
Glucose, UA: NEGATIVE mg/dL
Hgb urine dipstick: NEGATIVE
Ketones, ur: NEGATIVE mg/dL
Nitrite: POSITIVE — AB
Protein, ur: NEGATIVE mg/dL
Specific Gravity, Urine: 1.011 (ref 1.005–1.030)
WBC, UA: >50 WBC/hpf — ABNORMAL HIGH (ref 0–5)
pH: 7 (ref 5.0–8.0)

## 2022-02-02 LAB — PROTIME-INR
INR: 1.6 — ABNORMAL HIGH (ref 0.8–1.2)
Prothrombin Time: 18.7 seconds — ABNORMAL HIGH (ref 11.4–15.2)

## 2022-02-02 LAB — COMPREHENSIVE METABOLIC PANEL
ALT: 79 U/L — ABNORMAL HIGH (ref 0–44)
AST: 111 U/L — ABNORMAL HIGH (ref 15–41)
Albumin: 2.8 g/dL — ABNORMAL LOW (ref 3.5–5.0)
Alkaline Phosphatase: 286 U/L — ABNORMAL HIGH (ref 38–126)
Anion gap: 7 (ref 5–15)
BUN: 18 mg/dL (ref 8–23)
CO2: 26 mmol/L (ref 22–32)
Calcium: 8.9 mg/dL (ref 8.9–10.3)
Chloride: 106 mmol/L (ref 98–111)
Creatinine, Ser: 0.77 mg/dL (ref 0.44–1.00)
GFR, Estimated: >60 mL/min (ref >60)
Glucose, Bld: 85 mg/dL (ref 70–99)
Potassium: 3.8 mmol/L (ref 3.5–5.1)
Sodium: 139 mmol/L (ref 135–145)
Total Bilirubin: 1.1 mg/dL (ref 0.3–1.2)
Total Protein: 6.8 g/dL (ref 6.5–8.1)

## 2022-02-02 LAB — RESP PANEL BY RT-PCR (FLU A&B, COVID) ARPGX2
Influenza A by PCR: NEGATIVE
Influenza B by PCR: NEGATIVE
SARS Coronavirus 2 by RT PCR: NEGATIVE

## 2022-02-02 LAB — AMMONIA: Ammonia: 45 umol/L — ABNORMAL HIGH (ref 9–35)

## 2022-02-02 MED ORDER — LOSARTAN POTASSIUM 25 MG PO TABS: 50.0000 mg | ORAL_TABLET | Freq: Every day | ORAL | Status: DC

## 2022-02-02 MED ORDER — LEVOCARNITINE 330 MG PO TABS: 330.0000 mg | ORAL_TABLET | Freq: Three times a day (TID) | ORAL | Status: DC

## 2022-02-02 MED ORDER — LEVOCARNITINE 1 GM/10ML PO SOLN
330.0000 mg | Freq: Three times a day (TID) | ORAL | Status: DC
  Administered 2022-02-02 – 2022-02-05 (×8): 330 mg via ORAL
  Filled 2022-02-02 (×10): qty 3.3

## 2022-02-02 MED ORDER — DEXAMETHASONE SODIUM PHOSPHATE 10 MG/ML IJ SOLN
10.0000 mg | Freq: Once | INTRAMUSCULAR | Status: AC
  Administered 2022-02-02: 10 mg via INTRAMUSCULAR

## 2022-02-02 MED ORDER — LEVOTHYROXINE SODIUM 50 MCG PO TABS
75.0000 ug | ORAL_TABLET | Freq: Every day | ORAL | Status: DC
  Administered 2022-02-03 – 2022-02-05 (×3): 75 ug via ORAL
  Filled 2022-02-02 (×3): qty 1

## 2022-02-02 MED ORDER — ZINC GLUCONATE 50 MG PO TABS: 150.0000 mg | ORAL_TABLET | Freq: Three times a day (TID) | ORAL | Status: DC

## 2022-02-02 MED ORDER — LOSARTAN POTASSIUM 50 MG PO TABS: 50.0000 mg | ORAL_TABLET | Freq: Every day | ORAL | Status: DC

## 2022-02-02 MED ORDER — LACTULOSE 10 GM/15ML PO SOLN
30.0000 g | Freq: Three times a day (TID) | ORAL | Status: DC
  Administered 2022-02-03: 30 g via ORAL
  Administered 2022-02-03: 10 g via ORAL
  Administered 2022-02-03 – 2022-02-05 (×6): 30 g via ORAL
  Filled 2022-02-02 (×8): qty 45

## 2022-02-02 MED ORDER — LACTULOSE ENCEPHALOPATHY 10 GM/15ML PO SOLN
30.0000 g | Freq: Three times a day (TID) | ORAL | Status: DC
  Filled 2022-02-02: qty 45

## 2022-02-02 MED ORDER — ACETAMINOPHEN 650 MG RE SUPP: 650.0000 mg | Freq: Four times a day (QID) | RECTAL | Status: DC | PRN

## 2022-02-02 MED ORDER — SODIUM CHLORIDE 0.9 % IV SOLN
1.0000 g | INTRAVENOUS | Status: DC
  Administered 2022-02-03 – 2022-02-04 (×2): 1 g via INTRAVENOUS
  Filled 2022-02-02 (×2): qty 10

## 2022-02-02 MED ORDER — LOSARTAN POTASSIUM 50 MG PO TABS
50.0000 mg | ORAL_TABLET | Freq: Every day | ORAL | Status: DC
Start: 2022-02-03 — End: 2022-02-05
  Administered 2022-02-02 – 2022-02-04 (×3): 50 mg via ORAL
  Filled 2022-02-02 (×3): qty 1

## 2022-02-02 MED ORDER — SODIUM CHLORIDE 0.9% FLUSH
3.0000 mL | Freq: Two times a day (BID) | INTRAVENOUS | Status: DC
  Administered 2022-02-03 – 2022-02-05 (×5): 3 mL via INTRAVENOUS

## 2022-02-02 MED ORDER — ACETAMINOPHEN 325 MG PO TABS
650.0000 mg | ORAL_TABLET | Freq: Four times a day (QID) | ORAL | Status: DC | PRN
  Administered 2022-02-02: 650 mg via ORAL
  Filled 2022-02-02: qty 2

## 2022-02-02 MED ORDER — SODIUM CHLORIDE 0.9 % IV SOLN
1.0000 g | Freq: Once | INTRAVENOUS | Status: AC
  Administered 2022-02-02: 1 g via INTRAVENOUS
  Filled 2022-02-02: qty 10

## 2022-02-02 MED ORDER — ALBUTEROL SULFATE (2.5 MG/3ML) 0.083% IN NEBU
2.5000 mg | INHALATION_SOLUTION | Freq: Once | RESPIRATORY_TRACT | Status: AC
Start: 2022-02-02 — End: 2022-02-02
  Administered 2022-02-02: 2.5 mg via RESPIRATORY_TRACT

## 2022-02-02 MED ORDER — ZINC SULFATE 220 (50 ZN) MG PO CAPS
220.0000 mg | ORAL_CAPSULE | Freq: Two times a day (BID) | ORAL | Status: DC
  Administered 2022-02-03 – 2022-02-05 (×5): 220 mg via ORAL
  Filled 2022-02-02 (×5): qty 1

## 2022-02-02 NOTE — Discharge Instructions (Addendum)
Please go to the emergency room now for further evaluation. ?

## 2022-02-02 NOTE — ED Notes (Signed)
Patient is being discharged from the Urgent Care and sent to the Emergency Department via [facility] . Per L. Blanchie Serve, patient is in need of higher level of care due to need for further evaluation. Patient is aware and verbalizes understanding of plan of care.  ?Vitals:  ? 02/02/22 1441  ?BP: (!) 164/85  ?Pulse: (!) 50  ?Resp: 18  ?Temp: 97.6 ?F (36.4 ?C)  ?SpO2: 91%  ?  ?

## 2022-02-02 NOTE — ED Triage Notes (Signed)
Patient and patient's husband report that the patient has had a cough and chest congestion x 3 days. ?Patient's husband stated that he went to the pharmacy and was given Coricidin for her cough. Patient's husband states that he patient has been lethargic since taking the medication. ?Patient is alert and oriented, but falling asleep during triage. Patient also has facial swelling, but wakes when name called. ?Patient's husband also reports that the patient has liver issues and has a shunt present. ? ?

## 2022-02-02 NOTE — ED Provider Notes (Signed)
?[facility] DEPT ?Provider Note ? ? ?CSN: [id number] ?Arrival date & time: 02/02/22  1608 ? ?  ? ?History ? ?Chief Complaint  ?Patient presents with  ? Cough  ? chest congestion  ? lethargic  ? ? ?[name] is a 71 y.o. female. ? ? ?Cough ?Patient presents with cough and mental status change.  Has had a cough and congestion for the last 3 days.  Had gone to the pharmacy to refill her lactulose.  Has a history of liver disease.  Pharmacist recommended Coricidin for the cough.  Has been taking it since then decreased mental status.  More sleepy.  Face is swollen but states face swells with her not sleeping well.  Patient's husband states she has a history of hepatic encephalopathy and this seems different.  More like she is just overmedicated.  No fevers.  Has continued to have a cough.  Seen in urgent care and sent here.  Negative COVID test at home and COVID test done in urgent care.  Also given breathing treatment and steroids at urgent care ?  ?Past Medical History:  ?Diagnosis Date  ? Cancer [facility])   ? cecum  ? Elevated liver function tests   ? Esophageal varices (HCC)   ? Hemorrhage of gastrointestinal tract 05/04/2011  ? Hypothyroidism   ? Iron deficiency anemia   ? Liver disease   ? chemotherapy complication, per pt, shunts placed to bypass liver  ? Malignant neoplasm of cecum ([place])   ? Portal hypertension (HCC)   ? Splenomegaly   ? ?Past Surgical History:  ?Procedure Laterality Date  ? ESOPHAGEAL VARICE LIGATION    ? HEMICOLECTOMY  01/08/03  ? IR RADIOLOGIST EVAL & MGMT  12/20/2020  ? IR RADIOLOGIST EVAL & MGMT  05/29/2021  ? LIVER SURGERY    ? shunts placed after chemo complication  ? SKIN FULL THICKNESS GRAFT N/A 09/12/2019  ? Procedure: debridement and FTSG to the nose from left upper arm;  Surgeon: Cindra Presume, MD;  Location: [facility];  Service: Plastics;  Laterality: N/A;  2 hours, please  ? TIPS PROCEDURE    ? ? ?Home Medications ?Prior to Admission  medications   ?Medication Sig Start Date End Date Taking? Authorizing Provider  ?Cholecalciferol (VITAMIN D3) 50 MCG (2000 UT) TABS Take 2,000 Units by mouth at bedtime.     [provider]  ?Elderberry 575 MG/5ML SYRP Take 15 mLs by mouth at bedtime.    [provider]  ?ENULOSE 10 GM/15ML SOLN Take 30 mLs by mouth 3 (three) times daily. 11/27/21   [provider]  ?L-ORNITHINE PO Take 6 g by mouth daily.    [provider]  ?levOCARNitine (CARNITOR) 330 MG tablet Take 330 mg by mouth 3 (three) times daily. 12/15/21   [provider]  ?losartan (COZAAR) 100 MG tablet Take 1 tablet (100 mg total) by mouth daily. ?Patient taking differently: Take 50 mg by mouth daily. 02/19/21   Lesleigh Noe, MD  ?Multiple Vitamin (MULTI-VITAMIN) tablet Take 1 tablet by mouth daily.    [provider]  ?SYNTHROID 75 MCG tablet TAKE 1 TABLET BY MOUTH DAILY BEFORE BREAKFAST 11/28/21   Lesleigh Noe, MD  ?vitamin B-12 (CYANOCOBALAMIN) 1000 MCG tablet Take 1,000 mcg by mouth daily.    [provider]  ?vitamin C (ASCORBIC ACID) 500 MG tablet Take 1,500 mg by mouth at bedtime.    [provider]  ?zinc gluconate 50 MG tablet  Take 150 mg by mouth in the morning, at noon, and at bedtime.    [provider]  ?   ? ?Allergies    ?Contrast media [iodinated contrast media]   ? ?Review of Systems   ?Review of Systems  ?Constitutional:  Positive for appetite change.  ?HENT:    ?     Facial swelling.  ?Respiratory:  Positive for cough.   ?Cardiovascular:  Negative for leg swelling.  ?Gastrointestinal:  Negative for abdominal pain.  ?Musculoskeletal:  Negative for back pain.  ?Skin:  Negative for wound.  ?Psychiatric/Behavioral:  Positive for confusion.   ? ?Physical Exam ?Updated Vital Signs ?BP (!) 146/73   Pulse (!) 52   Temp 97.8 ?F (36.6 ?C) (Oral)   Resp 15   Ht '5\' 5"'$  (1.651 m)   Wt 62.6 kg   SpO2 94%   BMI 22.96 kg/m?  ?Physical Exam ?Vitals and nursing  note reviewed.  ?HENT:  ?   Head:  ?   Comments: Some edema of face. ?Cardiovascular:  ?   Rate and Rhythm: Bradycardia present.  ?Pulmonary:  ?   Comments: Diffuse wheezes with some prolonged expirations ?Abdominal:  ?   Tenderness: There is no abdominal tenderness.  ?Skin: ?   General: Skin is warm.  ?Neurological:  ?   Mental Status: She is oriented to person, place, and time.  ? ? ?ED Results / Procedures / Treatments   ?Labs ?(all labs ordered are listed, but only abnormal results are displayed) ?Labs Reviewed  ?COMPREHENSIVE METABOLIC PANEL - Abnormal; Notable for the following components:  ?    Result Value  ? Albumin 2.8 (*)   ? AST 111 (*)   ? ALT 79 (*)   ? Alkaline Phosphatase 286 (*)   ? All other components within normal limits  ?CBC WITH DIFFERENTIAL/PLATELET - Abnormal; Notable for the following components:  ? WBC 3.1 (*)   ? RBC 3.33 (*)   ? Hemoglobin 11.5 (*)   ? HCT 35.4 (*)   ? MCV 106.3 (*)   ? MCH 34.5 (*)   ? RDW 16.7 (*)   ? Platelets 81 (*)   ? Lymphs Abs 0.6 (*)   ? All other components within normal limits  ?AMMONIA - Abnormal; Notable for the following components:  ? Ammonia 45 (*)   ? All other components within normal limits  ?PROTIME-INR - Abnormal; Notable for the following components:  ? Prothrombin Time 18.7 (*)   ? INR 1.6 (*)   ? All other components within normal limits  ?URINALYSIS, ROUTINE W REFLEX MICROSCOPIC - Abnormal; Notable for the following components:  ? APPearance HAZY (*)   ? Nitrite POSITIVE (*)   ? Leukocytes,Ua LARGE (*)   ? WBC, UA >50 (*)   ? Bacteria, UA MANY (*)   ? All other components within normal limits  ?URINE CULTURE  ?RESP PANEL BY RT-PCR (FLU A&B, COVID) [id number]  ? ? ?EKG ?None ? ?Radiology ?DG Chest Portable 1 View ? ?Result Date: 02/02/2022 ?CLINICAL DATA:  Shortness of breath, cough, chest pain EXAM: PORTABLE CHEST 1 VIEW COMPARISON:  11/28/2020 FINDINGS: Cardiomegaly. Probable small left pleural effusion. The visualized skeletal structures are  unremarkable. IMPRESSION: Cardiomegaly.  Probable small left pleural effusion. Electronically Signed   By: Delanna Ahmadi M.D.   On: 02/02/2022 17:39   ? ?Procedures ?Procedures  ? ? ?Medications Ordered in ED ?Medications  ?cefTRIAXone (ROCEPHIN) 1 g in sodium chloride 0.9 % 100 mL IVPB (has  no administration in time range)  ? ? ?ED Course/ Medical Decision Making/ A&P ?  ?                        ?Medical Decision Making ?Amount and/or Complexity of Data Reviewed ?Labs: ordered. ?Radiology: ordered. ? ? ?Patient brought in for mental status change.  Has had a cough for last few days.  At times her sats low 90s or even upper 80s.  Sounds wheezy.  Has had a cough.  Negative COVID test at home and COVID test done at urgent care has not resulted yet.  Rechecked here but has not resulted either.  Chest x-ray reviewed and no pneumonia.  However has been on cough medicine.  Since starting that has had mental status changes.  It appears that it was coricedin which is hepatically metabolized.  Potentially could be building up and causing some mental status change.  History of hepatic encephalopathy 2, ammonia mildly elevated.  Patient's husband states this mental status change is different from her encephalopathy.  Lab work reviewed and reassuring otherwise.  Does have likely urinary tract infection however.  This also would explain the mental status change.  Culture sent and will treat with Rocephin.  Will discuss with hospitalist for admission with mental status change. ? ?I reviewed previous notes ? ? ? ? ? ? ? ?Final Clinical Impression(s) / ED Diagnoses ?Final diagnoses:  ?Encephalopathy  ?Acute cystitis without hematuria  ? ? ?Rx / DC Orders ?ED Discharge Orders   ? ? None  ? ?  ? ? ?  ?Davonna Belling, MD ?02/02/22 2057 ? ?

## 2022-02-02 NOTE — ED Triage Notes (Addendum)
Pt states feeling fatigued since Friday due to cough medication that she was taking (QC cough and cold), her spouse states she is wheezing more at night, has a cough and facial swelling. Patient does state she has some SOB, she does not display s/s of respiratory distress. ? ?Patient is A&Ox4. ?

## 2022-02-02 NOTE — H&P (Signed)
History and Physical   [name] ZSW:[id number] DOB: May 16, 1951 DOA: 02/02/2022  PCP: Lesleigh Noe, MD   Patient coming from: Home  Chief Complaint: Altered mental status, lethargy, cough  HPI: [name] is a 71 y.o. 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  Patient has had 3 days of cough and congestion.  Was at her pharmacy and pharmacy recommended Coricidin for her cough.  Since starting to take the Coricidin she has noticed decline in her mentation with increased lethargy.  Does have history of hepatic encephalopathy but this seems different from that for her and family.  Does have some facial swelling but she states this is not uncommon for her.  Was seen in urgent care where she was tested for COVID and sent to the ED.  She also received steroids and a nebulizer at urgent care.  She denies fevers, chills, chest pain, shortness of breath, abdominal pain, constipation, diarrhea, nausea, vomiting.  ED Course: Vital signs in the ED significant for heart rate in the 50s and blood pressure in the 322G to 254Y systolic.  Lab work-up included CMP which showed albumin of 2.8, AST mildly increased in baseline at 111, ALT mildly increased at baseline at 79 and ALP mildly increased at baseline to 86.  CBC with stable leukopenia at 3.1, hemoglobin stable at 11.5, platelets stable at 81.  PT and INR mildly elevated 18.7 and 1.6 respectively.  Respiratory panel for flu and COVID-negative.  Ammonia level mildly elevated at 45.  Urinalysis positive for nitrite, leukocyte, bacteria.  Urine culture pending.  Chest x-ray showed cardiomegaly and probable small left effusion.  Patient received ceftriaxone in the ED.  Review of Systems: As per HPI otherwise all other systems reviewed and are negative.  Past Medical History:   Diagnosis Date   Cancer [facility])    cecum   Elevated liver function tests    Esophageal varices (HCC)    Hemorrhage of gastrointestinal tract 05/04/2011   Hypothyroidism    Iron deficiency anemia    Liver disease    chemotherapy complication, per pt, shunts placed to bypass liver   Malignant neoplasm of cecum (HCC)    Portal hypertension (HCC)    Splenomegaly     Past Surgical History:  Procedure Laterality Date   ESOPHAGEAL VARICE LIGATION     HEMICOLECTOMY  01/08/03   IR RADIOLOGIST EVAL & MGMT  12/20/2020   IR RADIOLOGIST EVAL & MGMT  05/29/2021   LIVER SURGERY     shunts placed after chemo complication   SKIN FULL THICKNESS GRAFT N/A 09/12/2019   Procedure: debridement and FTSG to the nose from left upper arm;  Surgeon: Cindra Presume, MD;  Location: [place];  Service: Plastics;  Laterality: N/A;  2 hours, please   TIPS PROCEDURE      Social History  reports that she has never smoked. She has never used smokeless tobacco. She reports that she does not drink alcohol and does not use drugs.  Allergies  Allergen Reactions   Contrast Media [Iodinated Contrast Media] Hives    Family History  Problem Relation Age of Onset   Arthritis Mother    Hearing loss Mother    Heart disease Mother    Hypertension Mother    Arthritis Father    Diabetes Father    Heart disease Father    Breast  cancer Neg Hx    Colon cancer Neg Hx    Esophageal cancer Neg Hx    Pancreatic cancer Neg Hx    Stomach cancer Neg Hx   Reviewed on admission  Prior to Admission medications   Medication Sig Start Date End Date Taking? Authorizing Provider  Cholecalciferol (VITAMIN D3) 50 MCG (2000 UT) TABS Take 2,000 Units by mouth at bedtime.     [provider]  Elderberry 575 MG/5ML SYRP Take 15 mLs by mouth at bedtime.    [provider]  ENULOSE 10 GM/15ML SOLN Take 30 mLs by mouth 3 (three) times daily. 11/27/21   [provider]  L-ORNITHINE PO Take 6 g by  mouth daily.    [provider]  levOCARNitine (CARNITOR) 330 MG tablet Take 330 mg by mouth 3 (three) times daily. 12/15/21   [provider]  losartan (COZAAR) 100 MG tablet Take 1 tablet (100 mg total) by mouth daily. Patient taking differently: Take 50 mg by mouth daily. 02/19/21   Lesleigh Noe, MD  Multiple Vitamin (MULTI-VITAMIN) tablet Take 1 tablet by mouth daily.    [provider]  SYNTHROID 75 MCG tablet TAKE 1 TABLET BY MOUTH DAILY BEFORE BREAKFAST 11/28/21   Lesleigh Noe, MD  vitamin B-12 (CYANOCOBALAMIN) 1000 MCG tablet Take 1,000 mcg by mouth daily.    [provider]  vitamin C (ASCORBIC ACID) 500 MG tablet Take 1,500 mg by mouth at bedtime.    [provider]  zinc gluconate 50 MG tablet Take 150 mg by mouth in the morning, at noon, and at bedtime.    [provider]    Physical Exam: Vitals:   02/02/22 1945 02/02/22 2000 02/02/22 2030 02/02/22 2045  BP: 136/73 (!) 142/73 (!) 146/73 (!) 144/78  Pulse: (!) 49 (!) 51 (!) 52 (!) 52  Resp: (!) '25 13 15 13  '$ Temp:      TempSrc:      SpO2: 93% 94% 94% 93%  Weight:      Height:       Physical Exam Constitutional:      General: She is not in acute distress.    Appearance: Normal appearance.     Comments: Tired appearing  HENT:     Head: Normocephalic and atraumatic.     Mouth/Throat:     Mouth: Mucous membranes are moist.     Pharynx: Oropharynx is clear.  Eyes:     Extraocular Movements: Extraocular movements intact.     Pupils: Pupils are equal, round, and reactive to light.  Cardiovascular:     Rate and Rhythm: Normal rate and regular rhythm.     Pulses: Normal pulses.     Heart sounds: Normal heart sounds.  Pulmonary:     Effort: Pulmonary effort is normal. No respiratory distress.     Breath sounds: Normal breath sounds.     Comments: Upper airway secretion sounds Abdominal:     General: Bowel sounds are normal. There is no distension.     Palpations:  Abdomen is soft.     Tenderness: There is no abdominal tenderness.  Musculoskeletal:        General: No swelling or deformity.  Skin:    General: Skin is warm and dry.  Neurological:     General: No focal deficit present.     Mental Status: She is oriented to person, place, and time. Mental status is at baseline.    Labs on Admission: I have personally  reviewed following labs and imaging studies  CBC: Recent Labs  Lab 02/02/22 1710  WBC 3.1*  NEUTROABS 2.3  HGB 11.5*  HCT 35.4*  MCV 106.3*  PLT 81*    Basic Metabolic Panel: Recent Labs  Lab 02/02/22 1710  NA 139  K 3.8  CL 106  CO2 26  GLUCOSE 85  BUN 18  CREATININE 0.77  CALCIUM 8.9    GFR: Estimated Creatinine Clearance: 58.9 mL/min (by C-G formula based on SCr of 0.77 mg/dL).  Liver Function Tests: Recent Labs  Lab 02/02/22 1710  AST 111*  ALT 79*  ALKPHOS 286*  BILITOT 1.1  PROT 6.8  ALBUMIN 2.8*    Urine analysis:    Component Value Date/Time   COLORURINE YELLOW 02/02/2022 1805   APPEARANCEUR HAZY (A) 02/02/2022 1805   LABSPEC 1.011 02/02/2022 1805   PHURINE 7.0 02/02/2022 1805   GLUCOSEU NEGATIVE 02/02/2022 1805   HGBUR NEGATIVE 02/02/2022 1805   BILIRUBINUR NEGATIVE 02/02/2022 1805   KETONESUR NEGATIVE 02/02/2022 1805   PROTEINUR NEGATIVE 02/02/2022 1805   UROBILINOGEN 0.2 12/20/2010 1449   NITRITE POSITIVE (A) 02/02/2022 1805   LEUKOCYTESUR LARGE (A) 02/02/2022 1805    Radiological Exams on Admission: DG Chest Portable 1 View  Result Date: 02/02/2022 CLINICAL DATA:  Shortness of breath, cough, chest pain EXAM: PORTABLE CHEST 1 VIEW COMPARISON:  11/28/2020 FINDINGS: Cardiomegaly. Probable small left pleural effusion. The visualized skeletal structures are unremarkable. IMPRESSION: Cardiomegaly.  Probable small left pleural effusion. Electronically Signed   By: Delanna Ahmadi M.D.   On: 02/02/2022 17:39    EKG: Independently reviewed.  Sinus rhythm at 52 bpm.  Similar to previous but  rate slower.  Outpatient visit rates noted to be in the 41s.  Assessment/Plan Principal Problem:   Acute metabolic encephalopathy Active Problems:   Hypothyroidism   Essential hypertension   Cirrhosis of liver (HCC)   UTI (urinary tract infection)   Acute metabolic encephalopathy > Patient presenting with confusion in addition to cough.  Noted to have UTI as below.  As well as history of hepatic encephalopathy. > Encephalopathy seems to started around the time patient started taking Coricidin for cough which is hepatically cleared.  Medication induced is on differential as well as UTI that was diagnosed in the ED versus hepatic encephalopathy. > Patient and family states that this is different from her typical hepatic encephalopathy. - Monitor on telemetry - Treat UTI as below - Hold Coricidin - We will increase dose of lactulose from 20 g 3 times daily to 30 g 3 times daily for now  UTI > Patient noted to have urinalysis positive for nitrate, leukocytes, bacteria.  No significant leukocytosis does have stable leukopenia at 3.1. - Started on ceftriaxone in the ED which has been continued - Trend fever curve and WBC - Follow-up urine cultures  Cirrhosis > Secondary to chemo treatment for colon cancer.  History of portal hypertension, gastric varices, hepatic encephalopathy. > Mild increase in baseline LFT levels with AST 111, ALT 79, ALP 286.  PT 18.7, INR 1.6.  Platelets 81 which is stable. - Increase dose of lactulose for now as above - Not on any diuretics  Hypertension - Continue home losartan  For thyroidism - Continue home Synthroid  DVT prophylaxis: SCDs for now Code Status:   Full Family Communication:  Spouse updated at bedside.  Disposition Plan:   Patient is from:  Home  Anticipated DC to:  Home  Anticipated DC date:  1 to 3 days  Anticipated DC barriers: None  Consults called:  None Admission status:  Observation, telemetry  Severity of Illness: The  appropriate patient status for this patient is OBSERVATION. Observation status is judged to be reasonable and necessary in order to provide the required intensity of service to ensure the patient's safety. The patient's presenting symptoms, physical exam findings, and initial radiographic and laboratory data in the context of their medical condition is felt to place them at decreased risk for further clinical deterioration. Furthermore, it is anticipated that the patient will be medically stable for discharge from the hospital within 2 midnights of admission.    Marcelyn Bruins MD [facility]  How to contact the [facility] Attending or Consulting provider [place] or covering provider during after hours [place], for this patient?   Check the care team in [facility] and look for a) attending/consulting [facility] provider listed and b) the [facility] team listed Log into [email or web address] and use [place]'s universal password to access. If you do not have the password, please contact the hospital operator. Locate the [facility] provider you are looking for under [facility] and page to a number that you can be directly reached. If you still have difficulty reaching the provider, please page the [facility] (Director on Call) for the Hospitalists listed on amion for assistance.  02/02/2022, 9:35 PM

## 2022-02-02 NOTE — Telephone Encounter (Signed)
Pt husband called stating that pt is wheezing and sleeping a lot. Pt husband is asking what should he be given pt. I told pt husband that he would need to make pt an appt. Pt husband stated that he wanted advise first. Pt is asking for a call back. Please advise.  ?

## 2022-02-02 NOTE — Telephone Encounter (Signed)
I spoke with pt's husband; starting on 01/29/22 pt has dry cough and not resting well at night due to cough. Pt did not sleep well on 01/29/22,01/30/22, and 01/31/22 due to cough.pt started taking OTC cough and cold HBP cough med that really did not help cough but seemed to make pt very sleepy. Pt stopped cough med on 02/01/22 but pt is still sleeping a lot. Basically goes to bathroom otherwise is sleeping. Pt does not have any extremity weakness; no H/A, dizziness, CP or SOB. Pt has head congestion and started wheezing this morning. Pt does not have any other covid symptoms at this time. Pts husband is going to take pt to [facility] with appt  02/02/22 at 2:30. UC & ED precautions given and pts husband voiced understanding. Sending note to Dr Einar Pheasant and Adonis Brook CMA. ?

## 2022-02-02 NOTE — ED Provider Notes (Signed)
[facility]    CSN: [id number] Arrival date & time: 02/02/22  1418    HISTORY   Chief Complaint  Patient presents with   Cough   Shortness of Breath   Fatigue   Appointment   HPI [name] is a 71 y.o. female. 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  Patient is not currently taking Xifaxan for her hepatic encephalopathy due to cost.  Last ammonia level was checked in January 2023 and was 41.    Patient presents to urgent care today with her husband who states patient has been fatigued since Friday.  Has been states they have been giving her Coricidin cough and cold medication for people with high blood pressure.  Husband states that he is noticed that since she began feeling unwell, her eyelids have been very puffy, almost translucent with fluid buildup.  Husband states that if she is not awake coughing or taking medicine then she is asleep.  States that yesterday he gave her a beverage to drink and that she fell asleep while holding it dropping it on the floor.  Husband states that he has noticed that she has been wheezing as well, states she does not have a history of asthma or allergies.  On arrival today, patient had blood pressure of 164/85 with a heart rate of 50 and O2 sats varying between 91 and 95%, patient's oxygen levels dropped with deep and forceful inspiration and recovered with normal breathing.  Patient's heart rate is typically in the 50s as compared to previous vital signs over the past year.  Patient states she has been taking Synthroid as prescribed.  Patient is alert and oriented x4 today.  The history is provided by the patient and the spouse.  Past Medical History:   Diagnosis Date   Cancer [facility])    cecum   Elevated liver function tests    Esophageal varices (HCC)    Hemorrhage of gastrointestinal tract 05/04/2011   Hypothyroidism    Iron deficiency anemia    Liver disease    chemotherapy complication, per pt, shunts placed to bypass liver   Malignant neoplasm of cecum ([place])    Portal hypertension ([place])    Splenomegaly    Patient Active Problem List   Diagnosis Date Noted   COVID-19 vaccination declined 01/30/2021   Cirrhosis of liver ([place]) 12/04/2020   Essential hypertension 08/12/2020   Murmur, cardiac 03/19/2020   Chronic pain of both knees 01/29/2020   Morbid obesity ([place]) 01/29/2020   Hypothyroidism 06/30/2019   History of basal cell cancer 06/30/2019   Hx of colon cancer, stage III 11/30/2011   Esophageal varices ([place]) 06/12/2010   Portal hypertension ([place]) 01/23/2008   Past Surgical History:  Procedure Laterality Date   ESOPHAGEAL VARICE LIGATION     HEMICOLECTOMY  01/08/03   IR RADIOLOGIST EVAL & MGMT  12/20/2020   IR RADIOLOGIST EVAL & MGMT  05/29/2021   LIVER SURGERY     shunts placed after chemo complication   SKIN FULL THICKNESS GRAFT N/A 09/12/2019   Procedure: debridement and FTSG to the nose from left upper arm;  Surgeon: Cindra Presume, MD;  Location: [place];  Service: Plastics;  Laterality: N/A;  2 hours, please   TIPS PROCEDURE  OB History   No obstetric history on file.    Home Medications    Prior to Admission medications   Medication Sig Start Date End Date Taking? Authorizing Provider  Cholecalciferol (VITAMIN D3) 50 MCG (2000 UT) TABS Take 2,000 Units by mouth at bedtime.     [provider]  Elderberry 575 MG/5ML SYRP Take 15 mLs by mouth at bedtime.    [provider]  ENULOSE 10 GM/15ML SOLN Take 30 mLs by mouth 3 (three) times daily. 11/27/21   [provider]  L-ORNITHINE PO Take 6 g by mouth daily.    [provider]  levOCARNitine (CARNITOR) 330  MG tablet Take 330 mg by mouth 3 (three) times daily. 12/15/21   [provider]  losartan (COZAAR) 100 MG tablet Take 1 tablet (100 mg total) by mouth daily. Patient taking differently: Take 50 mg by mouth daily. 02/19/21   Lesleigh Noe, MD  Multiple Vitamin (MULTI-VITAMIN) tablet Take 1 tablet by mouth daily.    [provider]  SYNTHROID 75 MCG tablet TAKE 1 TABLET BY MOUTH DAILY BEFORE BREAKFAST 11/28/21   Lesleigh Noe, MD  vitamin B-12 (CYANOCOBALAMIN) 1000 MCG tablet Take 1,000 mcg by mouth daily.    [provider]  vitamin C (ASCORBIC ACID) 500 MG tablet Take 1,500 mg by mouth at bedtime.    [provider]  zinc gluconate 50 MG tablet Take 150 mg by mouth in the morning, at noon, and at bedtime.    [provider]   Family History Family History  Problem Relation Age of Onset   Arthritis Mother    Hearing loss Mother    Heart disease Mother    Hypertension Mother    Arthritis Father    Diabetes Father    Heart disease Father    Breast cancer Neg Hx    Colon cancer Neg Hx    Esophageal cancer Neg Hx    Pancreatic cancer Neg Hx    Stomach cancer Neg Hx    Social History Social History   Tobacco Use   Smoking status: Never   Smokeless tobacco: Never  Vaping Use   Vaping Use: Never used  Substance Use Topics   Alcohol use: Never   Drug use: Never   Allergies   Contrast media [iodinated contrast media]  Review of Systems Review of Systems Pertinent findings noted in history of present illness.   Physical Exam Triage Vital Signs ED Triage Vitals  Enc Vitals Group     BP 09/19/21 0827 (!) 147/82     Pulse Rate 09/19/21 0827 72     Resp 09/19/21 0827 18     Temp 09/19/21 0827 98.3 F (36.8 C)     Temp Source 09/19/21 0827 Oral     SpO2 09/19/21 0827 98 %     Weight --      Height --      Head Circumference --      Peak Flow --      Pain Score 09/19/21 0826 5     Pain Loc --      Pain Edu? --      Excl. in  [place]? --   No data found.  Updated Vital Signs BP (!) 164/85 (BP Location: Right Arm)    Pulse (!) 50    Temp 97.6 F (36.4 C) (Oral)    Resp 18    SpO2 91%   Physical Exam Vitals and nursing note reviewed.  Constitutional:      General: She is not in acute distress.    Appearance: Normal appearance. She is well-developed and well-groomed. She is obese. She is ill-appearing. She is not toxic-appearing or diaphoretic.     Comments: Somnolent  HENT:     Head: Normocephalic and atraumatic. Right periorbital erythema and left periorbital erythema present.     Salivary Glands: Right salivary gland is not diffusely enlarged or tender. Left salivary gland is not diffusely enlarged or tender.     Right Ear: Hearing, tympanic membrane, ear canal and external ear normal. No drainage. No middle ear effusion. There is no impacted cerumen. Tympanic membrane is not erythematous or bulging.     Left Ear: Hearing, tympanic membrane, ear canal and external ear normal. No drainage.  No middle ear effusion. There is no impacted cerumen. Tympanic membrane is not erythematous or bulging.     Nose: Nose normal. No nasal deformity, septal deviation, mucosal edema, congestion or rhinorrhea.     Right Turbinates: Not enlarged, swollen or pale.     Left Turbinates: Not enlarged, swollen or pale.     Right Sinus: No maxillary sinus tenderness or frontal sinus tenderness.     Left Sinus: No maxillary sinus tenderness or frontal sinus tenderness.     Mouth/Throat:     Lips: Pink. No lesions.     Mouth: Mucous membranes are moist. No oral lesions.     Pharynx: Oropharynx is clear. Uvula midline. No posterior oropharyngeal erythema or uvula swelling.     Tonsils: No tonsillar exudate. 0 on the right. 0 on the left.  Eyes:     General: Lids are normal.        Right eye: No discharge.        Left eye: No discharge.     Extraocular Movements: Extraocular movements intact.     Conjunctiva/sclera: Conjunctivae normal.      Right eye: Right conjunctiva is not injected.     Left eye: Left conjunctiva is not injected.  Neck:     Trachea: Trachea and phonation normal.  Cardiovascular:     Rate and Rhythm: Normal rate and regular rhythm.     Pulses: Normal pulses.     Heart sounds: Normal heart sounds. No murmur heard.   No friction rub. No gallop.  Pulmonary:     Effort: Pulmonary effort is normal. No accessory muscle usage, prolonged expiration or respiratory distress.     Breath sounds: No stridor, decreased air movement or transmitted upper airway sounds. Examination of the right-upper field reveals wheezing and rales. Examination of the left-upper field reveals wheezing and rales. Examination of the right-middle field reveals wheezing and rales. Examination of the left-middle field reveals wheezing and rales. Examination of the right-lower field reveals wheezing and rales. Examination of the left-lower field reveals wheezing and rales. Wheezing and rales present. No decreased breath sounds or rhonchi.  Chest:     Chest wall: No tenderness.  Musculoskeletal:        General: Normal range of motion.     Cervical back: Normal range of motion and neck supple. Normal range of motion.  Lymphadenopathy:     Cervical: No cervical adenopathy.  Skin:    General: Skin is warm and dry.     Findings: No erythema or rash.  Neurological:     General: No focal deficit present.     Mental Status: She is alert and oriented to person, place, and time.  Psychiatric:  Mood and Affect: Mood normal.        Behavior: Behavior normal. Behavior is cooperative.    Visual Acuity Right Eye Distance:   Left Eye Distance:   Bilateral Distance:    Right Eye Near:   Left Eye Near:    Bilateral Near:     UC Couse / Diagnostics / Procedures:    EKG  Radiology No results found.  Procedures Procedures (including critical care time)  UC Diagnoses / Final Clinical Impressions(s)   I have reviewed the triage vital  signs and the nursing notes.  Pertinent labs & imaging results that were available during my care of the patient were reviewed by me and considered in my medical decision making (see chart for details).   Final diagnoses:  Acute upper respiratory infection  Risk of exposure to communicable disease  COVID-19 vaccination declined  Wheezing  Facial edema  Hepatic cirrhosis, unspecified hepatic cirrhosis type, unspecified whether ascites present [facility])   Patient reported no improved work of breathing with albuterol nebulizer treatment and Decadron injection.  Patient has been advised that given her significant hepatic history and obvious respiratory infection, along with her not being vaccinated for COVID, recommended to go to the emergency room for further evaluation and possible treatment.  Husband states he will take her to [place] long now.  ED Prescriptions   None    PDMP not reviewed this encounter.  Pending results:  Labs Reviewed  COVID-19, FLU A+B AND RSV    Medications Ordered in UC: Medications  albuterol (PROVENTIL) (2.5 MG/3ML) 0.083% nebulizer solution 2.5 mg (2.5 mg Nebulization Given 02/02/22 1535)  dexamethasone (DECADRON) injection 10 mg (10 mg Intramuscular Given 02/02/22 1536)    Disposition Upon Discharge:  Condition: stable for discharge home Home: take medications as prescribed; routine discharge instructions as discussed; follow up as advised.  Patient presented with an acute illness with associated systemic symptoms and significant discomfort requiring urgent management. In my opinion, this is a condition that a prudent lay person (someone who possesses an average knowledge of health and medicine) may potentially expect to result in complications if not addressed urgently such as respiratory distress, impairment of bodily function or dysfunction of bodily organs.   Routine symptom specific, illness specific and/or disease specific instructions were discussed with  the patient and/or caregiver at length.   As such, the patient has been evaluated and assessed, work-up was performed and treatment was provided in alignment with urgent care protocols and evidence based medicine.  Patient/parent/caregiver has been advised that the patient may require follow up for further testing and treatment if the symptoms continue in spite of treatment, as clinically indicated and appropriate.  If the patient was tested for COVID-19, Influenza and/or RSV, then the patient/parent/guardian was advised to isolate at home pending the results of his/her diagnostic coronavirus test and potentially longer if theyre positive. I have also advised pt that if his/her COVID-19 test returns positive, it's recommended to self-isolate for at least 10 days after symptoms first appeared AND until fever-free for 24 hours without fever reducer AND other symptoms have improved or resolved. Discussed self-isolation recommendations as well as instructions for household member/close contacts as per the [facility] and [place] DHHS, and also gave patient the [place] packet with this information.  Patient/parent/caregiver has been advised to return to the [facility] or PCP in 3-5 days if no better; to PCP or the Emergency Department if new signs and symptoms develop, or if the current signs or symptoms continue to  change or worsen for further workup, evaluation and treatment as clinically indicated and appropriate  The patient will follow up with their current PCP if and as advised. If the patient does not currently have a PCP we will assist them in obtaining one.   The patient may need specialty follow up if the symptoms continue, in spite of conservative treatment and management, for further workup, evaluation, consultation and treatment as clinically indicated and appropriate.  Patient/parent/caregiver verbalized understanding and agreement of plan as discussed.  All questions were addressed during visit.  Please see  discharge instructions below for further details of plan.  Discharge Instructions:   Discharge Instructions      Please go to the emergency room now for further evaluation.      This office note has been dictated using Museum/gallery curator.  Unfortunately, and despite my best efforts, this method of dictation can sometimes lead to occasional typographical or grammatical errors.  I apologize in advance if this occurs.     Lynden Oxford Scales, PA-C 02/02/22 1552

## 2022-02-02 NOTE — Telephone Encounter (Signed)
Agree with need for evaluation.  

## 2022-02-03 DIAGNOSIS — Z91041 Radiographic dye allergy status: Secondary | ICD-10-CM | POA: Diagnosis not present

## 2022-02-03 DIAGNOSIS — Z9049 Acquired absence of other specified parts of digestive tract: Secondary | ICD-10-CM | POA: Diagnosis not present

## 2022-02-03 DIAGNOSIS — Z8249 Family history of ischemic heart disease and other diseases of the circulatory system: Secondary | ICD-10-CM | POA: Diagnosis not present

## 2022-02-03 DIAGNOSIS — D61818 Other pancytopenia: Secondary | ICD-10-CM | POA: Diagnosis present

## 2022-02-03 DIAGNOSIS — N3000 Acute cystitis without hematuria: Secondary | ICD-10-CM | POA: Diagnosis not present

## 2022-02-03 DIAGNOSIS — B9620 Unspecified Escherichia coli [E. coli] as the cause of diseases classified elsewhere: Secondary | ICD-10-CM | POA: Diagnosis present

## 2022-02-03 DIAGNOSIS — K7682 Hepatic encephalopathy: Secondary | ICD-10-CM | POA: Diagnosis not present

## 2022-02-03 DIAGNOSIS — K7030 Alcoholic cirrhosis of liver without ascites: Secondary | ICD-10-CM | POA: Diagnosis not present

## 2022-02-03 DIAGNOSIS — Z79899 Other long term (current) drug therapy: Secondary | ICD-10-CM | POA: Diagnosis not present

## 2022-02-03 DIAGNOSIS — G9340 Encephalopathy, unspecified: Secondary | ICD-10-CM | POA: Diagnosis present

## 2022-02-03 DIAGNOSIS — I864 Gastric varices: Secondary | ICD-10-CM | POA: Diagnosis present

## 2022-02-03 DIAGNOSIS — R609 Edema, unspecified: Secondary | ICD-10-CM | POA: Diagnosis not present

## 2022-02-03 DIAGNOSIS — Z9221 Personal history of antineoplastic chemotherapy: Secondary | ICD-10-CM | POA: Diagnosis not present

## 2022-02-03 DIAGNOSIS — K766 Portal hypertension: Secondary | ICD-10-CM | POA: Diagnosis present

## 2022-02-03 DIAGNOSIS — Z79890 Hormone replacement therapy (postmenopausal): Secondary | ICD-10-CM | POA: Diagnosis not present

## 2022-02-03 DIAGNOSIS — E669 Obesity, unspecified: Secondary | ICD-10-CM | POA: Diagnosis present

## 2022-02-03 DIAGNOSIS — T451X5A Adverse effect of antineoplastic and immunosuppressive drugs, initial encounter: Secondary | ICD-10-CM | POA: Diagnosis present

## 2022-02-03 DIAGNOSIS — G8929 Other chronic pain: Secondary | ICD-10-CM | POA: Diagnosis present

## 2022-02-03 DIAGNOSIS — R001 Bradycardia, unspecified: Secondary | ICD-10-CM | POA: Diagnosis present

## 2022-02-03 DIAGNOSIS — E876 Hypokalemia: Secondary | ICD-10-CM | POA: Diagnosis present

## 2022-02-03 DIAGNOSIS — K7460 Unspecified cirrhosis of liver: Secondary | ICD-10-CM | POA: Diagnosis present

## 2022-02-03 DIAGNOSIS — G9341 Metabolic encephalopathy: Secondary | ICD-10-CM | POA: Diagnosis not present

## 2022-02-03 DIAGNOSIS — I10 Essential (primary) hypertension: Secondary | ICD-10-CM | POA: Diagnosis present

## 2022-02-03 DIAGNOSIS — E039 Hypothyroidism, unspecified: Secondary | ICD-10-CM | POA: Diagnosis present

## 2022-02-03 DIAGNOSIS — Z20822 Contact with and (suspected) exposure to covid-19: Secondary | ICD-10-CM | POA: Diagnosis present

## 2022-02-03 DIAGNOSIS — N390 Urinary tract infection, site not specified: Secondary | ICD-10-CM | POA: Diagnosis present

## 2022-02-03 DIAGNOSIS — Z85038 Personal history of other malignant neoplasm of large intestine: Secondary | ICD-10-CM | POA: Diagnosis not present

## 2022-02-03 LAB — CBC
HCT: 35.7 % — ABNORMAL LOW (ref 36.0–46.0)
Hemoglobin: 11.8 g/dL — ABNORMAL LOW (ref 12.0–15.0)
MCH: 34.3 pg — ABNORMAL HIGH (ref 26.0–34.0)
MCHC: 33.1 g/dL (ref 30.0–36.0)
MCV: 103.8 fL — ABNORMAL HIGH (ref 80.0–100.0)
Platelets: 75 10*3/uL — ABNORMAL LOW (ref 150–400)
RBC: 3.44 MIL/uL — ABNORMAL LOW (ref 3.87–5.11)
RDW: 16.5 % — ABNORMAL HIGH (ref 11.5–15.5)
WBC: 2.2 10*3/uL — ABNORMAL LOW (ref 4.0–10.5)
nRBC: 0 % (ref 0.0–0.2)

## 2022-02-03 LAB — COMPREHENSIVE METABOLIC PANEL
ALT: 73 U/L — ABNORMAL HIGH (ref 0–44)
AST: 103 U/L — ABNORMAL HIGH (ref 15–41)
Albumin: 2.7 g/dL — ABNORMAL LOW (ref 3.5–5.0)
Alkaline Phosphatase: 282 U/L — ABNORMAL HIGH (ref 38–126)
Anion gap: 9 (ref 5–15)
BUN: 19 mg/dL (ref 8–23)
CO2: 25 mmol/L (ref 22–32)
Calcium: 8.6 mg/dL — ABNORMAL LOW (ref 8.9–10.3)
Chloride: 105 mmol/L (ref 98–111)
Creatinine, Ser: 0.74 mg/dL (ref 0.44–1.00)
GFR, Estimated: >60 mL/min (ref >60)
Glucose, Bld: 125 mg/dL — ABNORMAL HIGH (ref 70–99)
Potassium: 4.1 mmol/L (ref 3.5–5.1)
Sodium: 139 mmol/L (ref 135–145)
Total Bilirubin: 0.9 mg/dL (ref 0.3–1.2)
Total Protein: 6.7 g/dL (ref 6.5–8.1)

## 2022-02-03 LAB — PROTIME-INR
INR: 1.6 — ABNORMAL HIGH (ref 0.8–1.2)
Prothrombin Time: 18.6 seconds — ABNORMAL HIGH (ref 11.4–15.2)

## 2022-02-03 LAB — HIV ANTIBODY (ROUTINE TESTING W REFLEX): HIV Screen 4th Generation wRfx: NONREACTIVE

## 2022-02-03 MED ORDER — SPIRONOLACTONE 25 MG PO TABS
50.0000 mg | ORAL_TABLET | Freq: Every day | ORAL | Status: DC
  Administered 2022-02-03 – 2022-02-05 (×3): 50 mg via ORAL
  Filled 2022-02-03 (×3): qty 2

## 2022-02-03 MED ORDER — FUROSEMIDE 20 MG PO TABS
20.0000 mg | ORAL_TABLET | Freq: Every day | ORAL | Status: DC
  Administered 2022-02-03 – 2022-02-05 (×3): 20 mg via ORAL
  Filled 2022-02-03 (×3): qty 1

## 2022-02-03 NOTE — Plan of Care (Signed)
?  Problem: Clinical Measurements: ?Goal: Will remain free from infection ?Outcome: Progressing ?Goal: Respiratory complications will improve ?Outcome: Progressing ?  ?Problem: Activity: ?Goal: Risk for activity intolerance will decrease ?Outcome: Progressing ?  ?Problem: Safety: ?Goal: Ability to remain free from injury will improve ?Outcome: Progressing ?  ?Problem: Nutrition: ?Goal: Adequate nutrition will be maintained ?Outcome: Not Progressing ?  ?Problem: Elimination: ?Goal: Will not experience complications related to bowel motility ?Outcome: Adequate for Discharge ?  ?Problem: Elimination: ?Goal: Will not experience complications related to urinary retention ?Outcome: Not Applicable ?  ?

## 2022-02-03 NOTE — Progress Notes (Signed)
?PROGRESS NOTE ? ? ? ?[name]  KKX:[id number] DOB: 16-Aug-1951 DOA: 02/02/2022 ?PCP: Lesleigh Noe, MD  ? ?Brief Narrative:  ?71 y.o. female with medical history significant of colon cancer, cirrhosis secondary to chemo, portal hypertension, gastric varices, hypothyroidism, chronic pain, hypertension presented with altered mental status and cough and apparently was prescribed Coricidin for cough as an outpatient.  On presentation, COVID-19 and influenza testing were negative; ammonia was 45.  UA was suggestive of UTI.  Chest x-ray showed cardiomegaly and probable small left effusion.  She was started on IV Rocephin. ? ?Assessment & Plan: ?  ?Acute metabolic encephalopathy ?Acute hepatic encephalopathy ?-Presented with being more sleepy and slow.  Possibly from a combination of metabolic encephalopathy from UTI along with hepatic encephalopathy and recent Coricidin use for cough ?-Ammonia 45 on presentation.  Patient is on lactulose at home but only was having 1 bowel movement per day.  Continue increased dose of lactulose with a goal to have 2-3 loose bowel movements per day. ?-Antibiotic plan as below. ?-Patient was not able to afford rifaximin.  GI consultation to see if there are any alternatives. ?-Monitor mental status.  Fall precautions.  PT eval. ? ?UTI: Present on admission ?-Continue Rocephin.  Follow cultures ? ?Decompensated cirrhosis of liver with history of portal hypertension/gastric varices/hepatic encephalopathy: Possibly secondary to chemotherapy use for colon cancer ?Elevated LFTs ?Pancytopenia ?-LFTs are only mildly elevated.  Trend LFTs. ?-Currently no signs of bleeding.  Monitor CBC ?-Consult GI to see if patient needs to be started on diuretics: Currently not on diuretics as an outpatient ?-Strict input and output.  Daily weights.  Fluid restriction ? ?Hypertension ?-Monitor blood pressure.  Continue losartan ? ?Hypothyroidism ?-Continue Synthroid ? ?Physical deconditioning ?-PT  eval ? ?DVT prophylaxis: SCDs.  Avoid Lovenox/heparin because of thrombocytopenia ?Code Status: Full ?Family Communication: Husband at bedside ?Disposition Plan: ?Status is: Observation ?The patient will require care spanning > 2 midnights and should be moved to inpatient because: Of severity of illness.  Need for IV antibiotics.  PT eval.  Mental status not back to baseline yet. ? ? ? ?Consultants: Consulted GI ? ?Procedures: None ? ?Antimicrobials: Rocephin from 02/02/2022 onwards ? ? ?Subjective: ?Patient seen and examined at bedside.  Husband at bedside.  Patient is slow to respond.  No overnight fever, seizures, vomiting or worsening abdominal pain reported. ? ?Objective: ?Vitals:  ? 02/02/22 2200 02/02/22 2312 02/03/22 0427 02/03/22 0734  ?BP: 138/76 124/71 (!) 143/74 126/80  ?Pulse: (!) 53 (!) 56 (!) 51 (!) 50  ?Resp: '12 18 18 16  '$ ?Temp:  (!) 97.5 ?F (36.4 ?C) 97.9 ?F (36.6 ?C) (!) 97.5 ?F (36.4 ?C)  ?TempSrc:  Oral Oral Oral  ?SpO2: 94% 96% 95% 93%  ?Weight:  81.9 kg    ?Height:      ? ? ?Intake/Output Summary (Last 24 hours) at 02/03/2022 1121 ?Last data filed at 02/02/2022 2208 ?Gross per 24 hour  ?Intake 100 ml  ?Output --  ?Net 100 ml  ? ?Filed Weights  ? 02/02/22 1636 02/02/22 2312  ?Weight: 62.6 kg 81.9 kg  ? ? ?Examination: ? ?General exam: Appears calm and comfortable.  Looks chronically ill and deconditioned. ?Respiratory system: Bilateral decreased breath sounds at bases with some scattered crackles ?Cardiovascular system: S1 & S2 heard, intermittently bradycardic  ?gastrointestinal system: Abdomen is slightly distended, soft and nontender. Normal bowel sounds heard. ?Extremities: No cyanosis, clubbing; trace lower extremity edema present ?Central nervous system: Awake, very slow to respond, poor historian.  No focal neurological deficits. Moving extremities ?Skin: No rashes, lesions or ulcers ?Psychiatry: Affect is extremely flat.  No signs of agitation currently. ? ? ?Data Reviewed: I have  personally reviewed following labs and imaging studies ? ?CBC: ?Recent Labs  ?Lab 02/02/22 ?1710 02/03/22 ?0456  ?WBC 3.1* 2.2*  ?NEUTROABS 2.3  --   ?HGB 11.5* 11.8*  ?HCT 35.4* 35.7*  ?MCV 106.3* 103.8*  ?PLT 81* 75*  ? ?Basic Metabolic Panel: ?Recent Labs  ?Lab 02/02/22 ?1710 02/03/22 ?0456  ?NA 139 139  ?K 3.8 4.1  ?CL 106 105  ?CO2 26 25  ?GLUCOSE 85 125*  ?BUN 18 19  ?CREATININE 0.77 0.74  ?CALCIUM 8.9 8.6*  ? ?GFR: ?Estimated Creatinine Clearance: 69.2 mL/min (by C-G formula based on SCr of 0.74 mg/dL). ?Liver Function Tests: ?Recent Labs  ?Lab 02/02/22 ?1710 02/03/22 ?0456  ?AST 111* 103*  ?ALT 79* 73*  ?ALKPHOS 286* 282*  ?BILITOT 1.1 0.9  ?PROT 6.8 6.7  ?ALBUMIN 2.8* 2.7*  ? ?No results for input(s): LIPASE, AMYLASE in the last 168 hours. ?Recent Labs  ?Lab 02/02/22 ?1730  ?AMMONIA 45*  ? ?Coagulation Profile: ?Recent Labs  ?Lab 02/02/22 ?1710 02/03/22 ?0456  ?INR 1.6* 1.6*  ? ?Cardiac Enzymes: ?No results for input(s): CKTOTAL, CKMB, CKMBINDEX, TROPONINI in the last 168 hours. ?BNP (last 3 results) ?No results for input(s): PROBNP in the last 8760 hours. ?HbA1C: ?No results for input(s): HGBA1C in the last 72 hours. ?CBG: ?No results for input(s): GLUCAP in the last 168 hours. ?Lipid Profile: ?No results for input(s): CHOL, HDL, LDLCALC, TRIG, CHOLHDL, LDLDIRECT in the last 72 hours. ?Thyroid Function Tests: ?No results for input(s): TSH, T4TOTAL, FREET4, T3FREE, THYROIDAB in the last 72 hours. ?Anemia Panel: ?No results for input(s): VITAMINB12, FOLATE, FERRITIN, TIBC, IRON, RETICCTPCT in the last 72 hours. ?Sepsis Labs: ?No results for input(s): PROCALCITON, LATICACIDVEN in the last 168 hours. ? ?Recent Results (from the past 240 hour(s))  ?Resp Panel by RT-PCR (Flu A&B, Covid) Nasopharyngeal Swab     Status: None  ? Collection Time: 02/02/22  9:38 PM  ? Specimen: Nasopharyngeal Swab; Nasopharyngeal(NP) swabs in vial transport medium  ?Result Value Ref Range Status  ? SARS Coronavirus 2 by RT PCR  NEGATIVE NEGATIVE Final  ?  Comment: (NOTE) ?SARS-CoV-2 target nucleic acids are NOT DETECTED. ? ?The SARS-CoV-2 RNA is generally detectable in upper respiratory ?specimens during the acute phase of infection. The lowest ?concentration of SARS-CoV-2 viral copies this assay can detect is ?138 copies/mL. A negative result does not preclude SARS-Cov-2 ?infection and should not be used as the sole basis for treatment or ?other patient management decisions. A negative result may occur with  ?improper specimen collection/handling, submission of specimen other ?than nasopharyngeal swab, presence of viral mutation(s) within the ?areas targeted by this assay, and inadequate number of viral ?copies(<138 copies/mL). A negative result must be combined with ?clinical observations, patient history, and epidemiological ?information. The expected result is Negative. ? ?Fact Sheet for Patients:  ?EntrepreneurPulse.com.au ? ?Fact Sheet for Healthcare Providers:  ?IncredibleEmployment.be ? ?This test is no t yet approved or cleared by the [place] FDA and  ?has been authorized for detection and/or diagnosis of SARS-CoV-2 by ?FDA under an Emergency Use Authorization (EUA). This EUA will remain  ?in effect (meaning this test can be used) for the duration of the ?COVID-19 declaration under Section 564(b)(1) of the Act, 21 ?U.S.C.section 360bbb-3(b)(1), unless the authorization is terminated  ?or revoked sooner.  ? ? ?  ?  Influenza A by PCR NEGATIVE NEGATIVE Final  ? Influenza B by PCR NEGATIVE NEGATIVE Final  ?  Comment: (NOTE) ?The Xpert Xpress SARS-CoV-2/FLU/RSV plus assay is intended as an aid ?in the diagnosis of influenza from Nasopharyngeal swab specimens and ?should not be used as a sole basis for treatment. Nasal washings and ?aspirates are unacceptable for Xpert Xpress SARS-CoV-2/FLU/RSV ?testing. ? ?Fact Sheet for Patients: ?EntrepreneurPulse.com.au ? ?Fact Sheet for  Healthcare Providers: ?IncredibleEmployment.be ? ?This test is not yet approved or cleared by the [place] FDA and ?has been authorized for detection and/or diagnosis of SARS-CoV-2 by ?FDA under an [employer]

## 2022-02-03 NOTE — Consult Note (Addendum)
Consultation  Referring Provider:  [facility]/ Hanley Ben Primary Care Physician:  Lynnda Child, MD Primary Gastroenterologist:  Dr. Orvan Falconer  Reason for Consultation: Hepatic encephalopathy, cirrhosis, portal hypertension  HPI: [name] is a 71 y.o. female, known to Dr. Orvan Falconer and also being followed by [facility].  Patient has remote history of cecal cancer in 2004 for which she underwent resection and chemotherapy with oxaliplatin.  She developed a noncirrhotic portal hypertension felt secondary to oxaliplatin.  She required TIPS in 2012 for variceal bleed.  She developed issues with mild hepatic encephalopathy over a year ago.  Her husband says he feels that the encephalopathy came on after she was hospitalized with COVID-19 in September 2021.  She had been managed with lactulose which she has been on chronically.  Unfortunately unable to take Xifaxan as her insurance would not cover it. She was last seen in our office in January 2023, now found to have cirrhosis by ultrasound.  EGD was being requested by [facility] for reassessment for potential varices and patient is overdue for colonoscopy and was to be scheduled for both of these procedures at the time of that visit.  She has not scheduled those to date. Most recent visit with [facility]ology December 2022.  Last EGD 2010 Patient had ultrasound February 2023 that shows multiple gallstones, evidence of adenomyomatosis of the gallbladder wall, no ascites, TIPS patent though not interrogated, no focal hepatic lesion .  Patient was admitted yesterday, brought in by her husband with lethargy and cough.  He says she has been ill since Friday, 01/30/2022.  He had noticed that she had been coughing some so while he was at the pharmacy getting her lactulose he also asked pharmacist for recommendation for cough medicine knowing her history of liver disease.  They were advised Coricidin which she had been giving her over the weekend.   She had not missed any doses of lactulose.  No fever or chills that he was aware of no complaints of abdominal pain, no nausea or vomiting, no melena.  He just noticed a "Rattely" cough. She also had a swollen face and very puffy periorbital area yesterday which is much improved today.  He said she had been laying down for 3 days and he has noticed that she gets puffy intermittently.  She has also had some increase in lower extremity edema, no increase in abdominal girth. They have been eating very healthy, focusing on a high-protein diet which was instructed by [facility] for purposes of weight gain, always eat low-sodium.  Evaluation in the ER yesterday, respiratory panel negative Chest x-ray with cardiomegaly and small left effusion, otherwise negative. Labs show WBC of 2.2/hemoglobin 11.8/hematocrit 35/platelets 75 Sodium 139/potassium 4.1/BUN 19/creatinine 0.74/albumin 2.7 LFTs T. bili 0.9/alk phos 28/AST 103/ALT 73 Pro time 18.6/INR 1.6 Ammonia mildly elevated at 45 UA was positive  He has been started on ceftriaxone.  Patient sitting up in a chair this afternoon, feels much better mentating a little slowly but appropriately.  Still coughing some.   Past Medical History:  Diagnosis Date   Cancer [facility])    cecum   Elevated liver function tests    Esophageal varices (HCC)    Hemorrhage of gastrointestinal tract 05/04/2011   Hypothyroidism    Iron deficiency anemia    Liver disease    chemotherapy complication, per pt, shunts placed to bypass liver   Malignant neoplasm of cecum (HCC)    Portal hypertension (HCC)    Splenomegaly  Past Surgical History:  Procedure Laterality Date   ESOPHAGEAL VARICE LIGATION     HEMICOLECTOMY  01/08/03   IR RADIOLOGIST EVAL & MGMT  12/20/2020   IR RADIOLOGIST EVAL & MGMT  05/29/2021   LIVER SURGERY     shunts placed after chemo complication   SKIN FULL THICKNESS GRAFT N/A 09/12/2019   Procedure: debridement and FTSG to the nose from  left upper arm;  Surgeon: Allena Napoleon, MD;  Location: [facility];  Service: Plastics;  Laterality: N/A;  2 hours, please   TIPS PROCEDURE      Prior to Admission medications   Medication Sig Start Date End Date Taking? Authorizing Provider  acetaminophen (TYLENOL) 500 MG tablet Take 500 mg by mouth every 6 (six) hours as needed for mild pain or headache.   Yes [provider]  Biotin 81191 MCG TABS Take 10,000 mcg by mouth at bedtime.   Yes [provider]  Cholecalciferol (VITAMIN D3) 50 MCG (2000 UT) TABS Take 2,000 Units by mouth at bedtime.    Yes [provider]  Elderberry 575 MG/5ML SYRP Take 15 mLs by mouth at bedtime.   Yes [provider]  ENULOSE 10 GM/15ML SOLN Take 30 g by mouth 3 (three) times daily. 11/27/21  Yes [provider]  levOCARNitine (CARNITOR) 330 MG tablet Take 330 mg by mouth 3 (three) times daily. 12/15/21  Yes [provider]  losartan (COZAAR) 100 MG tablet Take 1 tablet (100 mg total) by mouth daily. Patient taking differently: Take 50 mg by mouth at bedtime. 02/19/21  Yes Lynnda Child, MD  Multiple Vitamin (MULTI-VITAMIN) tablet Take 1 tablet by mouth daily.   Yes [provider]  SYNTHROID 75 MCG tablet TAKE 1 TABLET BY MOUTH DAILY BEFORE BREAKFAST Patient taking differently: Take 75 mcg by mouth daily before breakfast. 11/28/21  Yes Lynnda Child, MD  vitamin B-12 (CYANOCOBALAMIN) 1000 MCG tablet Take 1,000 mcg by mouth in the morning.   Yes [provider]  vitamin C (ASCORBIC ACID) 500 MG tablet Take 1,500 mg by mouth at bedtime.   Yes [provider]  zinc gluconate 50 MG tablet Take 150 mg by mouth in the morning, at noon, and at bedtime.   Yes [provider]  L-ORNITHINE PO Take 6 g by mouth daily. Patient not taking: Reported on 02/02/2022    [provider]    Current Facility-Administered Medications  Medication Dose Route Frequency  Provider Last Rate Last Admin   acetaminophen (TYLENOL) tablet 650 mg  650 mg Oral Q6H PRN Synetta Fail, MD   650 mg at 02/02/22 2358   Or   acetaminophen (TYLENOL) suppository 650 mg  650 mg Rectal Q6H PRN Synetta Fail, MD       cefTRIAXone (ROCEPHIN) 1 g in sodium chloride 0.9 % 100 mL IVPB  1 g Intravenous Q24H Synetta Fail, MD       lactulose (CHRONULAC) 10 GM/15ML solution 30 g  30 g Oral TID Synetta Fail, MD   30 g at 02/03/22 0950   levOCARNitine (CARNITOR) 1 GM/10ML solution 330 mg  330 mg Oral TID Synetta Fail, MD   330 mg at 02/03/22 0950   levothyroxine (SYNTHROID) tablet 75 mcg  75 mcg Oral QAC breakfast Synetta Fail, MD   75 mcg at 02/03/22 0454   losartan (COZAAR) tablet 50 mg  50 mg Oral QHS Luiz Iron, NP   50 mg  at 02/02/22 2358   sodium chloride flush (NS) 0.9 % injection 3 mL  3 mL Intravenous Q12H Synetta Fail, MD   3 mL at 02/03/22 1610   zinc sulfate capsule 220 mg  220 mg Oral BID Synetta Fail, MD   220 mg at 02/03/22 0950    Allergies as of 02/02/2022 - Review Complete 02/02/2022  Allergen Reaction Noted   Contrast media [iodinated contrast media] Hives 11/30/2011    Family History  Problem Relation Age of Onset   Arthritis Mother    Hearing loss Mother    Heart disease Mother    Hypertension Mother    Arthritis Father    Diabetes Father    Heart disease Father    Breast cancer Neg Hx    Colon cancer Neg Hx    Esophageal cancer Neg Hx    Pancreatic cancer Neg Hx    Stomach cancer Neg Hx     Social History   Socioeconomic History   Marital status: Married    Spouse name: Animal nutritionist   Number of children: 2   Years of education: Not on file   Highest education level: Not on file  Occupational History   Occupation: Librarian  Tobacco Use   Smoking status: Never   Smokeless tobacco: Never  Vaping Use   Vaping Use: Never used  Substance and Sexual Activity   Alcohol use: Never   Drug use:  Never   Sexual activity: Not Currently    Partners: Male  Other Topics Concern   Not on file  Social History Narrative   12/04/20   From: the area   Living: with husband, [name] (1994)   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      Family: 2 children Designer, fashion/clothing and Optometrist - 2 grandchildren - nearby      Enjoys: read      Exercise: trying to get back to exercise   Diet: healthy, limits fast foods      Safety   Seat belts: Yes    Guns: Yes  and secure   Safe in relationships: Yes    Social Determinants of Corporate investment banker Strain: Not on file  Food Insecurity: Not on file  Transportation Needs: Not on file  Physical Activity: Not on file  Stress: Not on file  Social Connections: Not on file  Intimate Partner Violence: Not on file    Review of Systems: Pertinent positive and negative review of systems were noted in the above HPI section.  All other review of systems was otherwise negative.   Physical Exam: Vital signs in last 24 hours: Temp:  [97.5 F (36.4 C)-97.9 F (36.6 C)] 97.6 F (36.4 C) (03/14 1137) Pulse Rate:  [46-56] 51 (03/14 1137) Resp:  [12-25] 16 (03/14 1137) BP: (117-164)/(63-85) 117/65 (03/14 1137) SpO2:  [91 %-96 %] 93 % (03/14 1215) Weight:  [62.6 kg-81.9 kg] 81.9 kg (03/13 2312) Last BM Date : 01/31/22 General:   Alert,  Well-developed, well-nourished, older white female pleasant and cooperative in NAD.  Husband at bedside Head:  Normocephalic and atraumatic. Eyes:  Sclera clear, no icterus.   Conjunctiva pink. Ears:  Normal auditory acuity. Nose:  No deformity, discharge,  or lesions. Mouth:  No deformity or lesions.   Neck:  Supple; no masses or thyromegaly. Lungs: Scattered Rales and rhonchi bilaterally  heart:  Regular rate and rhythm; no murmurs, clicks, rubs,  or gallops. Abdomen:  Soft,nontender, BS active,nonpalp mass or hsm.  No  appreciable fluid wave Rectal: Not done Msk:  Symmetrical without gross deformities.  . Pulses:  Normal pulses noted. Extremities: 2+ edema bilateral lower extremities to above the knee, some chronic stasis changes from the shins to the feet Neurologic:  Alert and  oriented x4;  grossly normal neurologically.  Mentation appropriate but a little bit slow, no asterixis Skin:  Intact without significant lesions or rashes.. Psych:  Alert and cooperative. Normal mood and affect.  Intake/Output from previous day: 03/13 0701 - 03/14 0700 In: 100 [IV Piggyback:100] Out: -  Intake/Output this shift: No intake/output data recorded.  Lab Results: Recent Labs    02/02/22 1710 02/03/22 0456  WBC 3.1* 2.2*  HGB 11.5* 11.8*  HCT 35.4* 35.7*  PLT 81* 75*   BMET Recent Labs    02/02/22 1710 02/03/22 0456  NA 139 139  K 3.8 4.1  CL 106 105  CO2 26 25  GLUCOSE 85 125*  BUN 18 19  CREATININE 0.77 0.74  CALCIUM 8.9 8.6*   LFT Recent Labs    02/03/22 0456  PROT 6.7  ALBUMIN 2.7*  AST 103*  ALT 73*  ALKPHOS 282*  BILITOT 0.9   PT/INR Recent Labs    02/02/22 1710 02/03/22 0456  LABPROT 18.7* 18.6*  INR 1.6* 1.6*     IMPRESSION:  #76  71 year old white female with chemo induced noncirrhotic portal hypertension, after treatment for cecal adenocarcinoma 2004. Patient required TIPS 2012 for variceal hemorrhage refractory Has since developed evidence of cirrhosis.  Current M ELD =12  Not a transplant candidate per [facility], age and prior history  #2  Chronic mild hepatic encephalopathy with exacerbation over the past 4 days likely secondary to infection/UTI less minus upper respiratory infection, plus minus Coricidin  #3 UTI- on ceftriaxone #4  persistent cough-scattered rhonchi on exam concerned with upper respiratory infection question bronchitis, no infiltrate on chest x-ray yesterday #5 gallstones and gallbladder adenomyomatosis #6 colon cancer screening-significantly overdue for follow-up colonoscopy #7 variceal surveillance-needs follow-up  EGD  #8 peripheral edema consistent with mild volume overload   PLAN: Agree with increasing lactulose to 4 tablespoons / 30 g 3 times daily, and discharged on increased dose Have sent a message through to our office to investigate prior authorization/patient assistance for Xifaxan as she would benefit from Xifaxan 550 twice daily.  Unfortunately no other helpful alternatives  Check AFP Start Lasix 20 mg p.o. daily and Aldactone 50 mg p.o. daily Monitor daily weights Monitor kidney function We will arrange for outpatient follow-up with Dr. Orvan Falconer or myself, in a few weeks and at that time if she is doing well we will get her scheduled for colonoscopy and EGD which can be done outpatient.   Amy Esterwood PA-C 02/03/2022, 1:05 PM  GI ATTENDING  History, laboratories, x-rays personally reviewed.  Patient seen and examined.  Reviewed comprehensive consultation note as outlined above (I participated in greater than 50% of all aspects of the above outlined consult including impression and plan). Patient presents to the hospital with change in mental status due to hepatic encephalopathy.  This seems to have been precipitated by acute UTI.  On antibiotics and doing better.  We have adjusted her lactulose to achieve 3-5 bowel movements per day.  Amy discussed this with the patient's husband.  As well, trying to get Xifaxan assistance from the pharmaceutical company (cost prohibitive for the patient).  However, with resolution of UTI and adjustment of lactulose, she may not need Xifaxan (at present).  Terms of  peripheral edema.  Low-dose diuretics have been started.  The patient has normal renal function.  We will follow up tomorrow.  We will also arrange outpatient GI follow-up at the appropriate time.  Wilhemina Bonito. Eda Keys., M.D. [facility] Division of Gastroenterology

## 2022-02-04 ENCOUNTER — Other Ambulatory Visit: Payer: Self-pay

## 2022-02-04 DIAGNOSIS — R634 Abnormal weight loss: Secondary | ICD-10-CM

## 2022-02-04 DIAGNOSIS — D61818 Other pancytopenia: Secondary | ICD-10-CM

## 2022-02-04 DIAGNOSIS — K766 Portal hypertension: Secondary | ICD-10-CM

## 2022-02-04 DIAGNOSIS — K7460 Unspecified cirrhosis of liver: Secondary | ICD-10-CM

## 2022-02-04 DIAGNOSIS — K7682 Hepatic encephalopathy: Secondary | ICD-10-CM

## 2022-02-04 DIAGNOSIS — R748 Abnormal levels of other serum enzymes: Secondary | ICD-10-CM

## 2022-02-04 LAB — COMPREHENSIVE METABOLIC PANEL
ALT: 73 U/L — ABNORMAL HIGH (ref 0–44)
AST: 102 U/L — ABNORMAL HIGH (ref 15–41)
Albumin: 2.7 g/dL — ABNORMAL LOW (ref 3.5–5.0)
Alkaline Phosphatase: 286 U/L — ABNORMAL HIGH (ref 38–126)
Anion gap: 8 (ref 5–15)
BUN: 22 mg/dL (ref 8–23)
CO2: 26 mmol/L (ref 22–32)
Calcium: 8.7 mg/dL — ABNORMAL LOW (ref 8.9–10.3)
Chloride: 109 mmol/L (ref 98–111)
Creatinine, Ser: 0.85 mg/dL (ref 0.44–1.00)
GFR, Estimated: >60 mL/min (ref >60)
Glucose, Bld: 89 mg/dL (ref 70–99)
Potassium: 3.9 mmol/L (ref 3.5–5.1)
Sodium: 143 mmol/L (ref 135–145)
Total Bilirubin: 0.7 mg/dL (ref 0.3–1.2)
Total Protein: 6.6 g/dL (ref 6.5–8.1)

## 2022-02-04 LAB — CBC WITH DIFFERENTIAL/PLATELET
Abs Immature Granulocytes: 0.01 10*3/uL (ref 0.00–0.07)
Basophils Absolute: 0 10*3/uL (ref 0.0–0.1)
Basophils Relative: 0 %
Eosinophils Absolute: 0.1 10*3/uL (ref 0.0–0.5)
Eosinophils Relative: 2 %
HCT: 34.4 % — ABNORMAL LOW (ref 36.0–46.0)
Hemoglobin: 11.3 g/dL — ABNORMAL LOW (ref 12.0–15.0)
Immature Granulocytes: 0 %
Lymphocytes Relative: 21 %
Lymphs Abs: 0.9 10*3/uL (ref 0.7–4.0)
MCH: 34.5 pg — ABNORMAL HIGH (ref 26.0–34.0)
MCHC: 32.8 g/dL (ref 30.0–36.0)
MCV: 104.9 fL — ABNORMAL HIGH (ref 80.0–100.0)
Monocytes Absolute: 0.3 10*3/uL (ref 0.1–1.0)
Monocytes Relative: 8 %
Neutro Abs: 3 10*3/uL (ref 1.7–7.7)
Neutrophils Relative %: 69 %
Platelets: 97 10*3/uL — ABNORMAL LOW (ref 150–400)
RBC: 3.28 MIL/uL — ABNORMAL LOW (ref 3.87–5.11)
RDW: 16.7 % — ABNORMAL HIGH (ref 11.5–15.5)
WBC: 4.3 10*3/uL (ref 4.0–10.5)
nRBC: 0 % (ref 0.0–0.2)

## 2022-02-04 LAB — COVID-19, FLU A+B AND RSV
Influenza A, NAA: NOT DETECTED
Influenza B, NAA: NOT DETECTED
RSV, NAA: NOT DETECTED
SARS-CoV-2, NAA: NOT DETECTED

## 2022-02-04 LAB — MAGNESIUM: Magnesium: 1.6 mg/dL — ABNORMAL LOW (ref 1.7–2.4)

## 2022-02-04 LAB — AMMONIA: Ammonia: 56 umol/L — ABNORMAL HIGH (ref 9–35)

## 2022-02-04 LAB — CK: Total CK: 61 U/L (ref 38–234)

## 2022-02-04 MED ORDER — MAGNESIUM SULFATE 2 GM/50ML IV SOLN
2.0000 g | Freq: Once | INTRAVENOUS | Status: AC
Start: 2022-02-04 — End: 2022-02-04
  Administered 2022-02-04: 2 g via INTRAVENOUS
  Filled 2022-02-04: qty 50

## 2022-02-04 NOTE — Assessment & Plan Note (Addendum)
Lactulose as above. ?

## 2022-02-04 NOTE — Progress Notes (Addendum)
Patient ID: [name], female   DOB: 1951-11-04, 71 y.o.   MRN: [id number] ? ? ? Progress Note ? ? Subjective  ? Day # 2 ? CC; cirrhosis, hepatic encephalopathy, UTI ? ?Ceftriaxone ? ?AFP pending ?BUN 22/creatinine 0.85 ?WBC 4.3/hemoglobin 11.3/hematocrit 34.4/platelet 97 ?T. bili 0.7/alk phos 286/ALT 73/AST 102 ?Urine culture pending ? ?Patient says she is feeling a bit better, mentating well, still has a wet productive cough.  No complaints of abdominal pain, eating without difficulty ? ? Objective  ? ?Vital signs in last 24 hours: ?Temp:  [97.5 ?F (36.4 ?C)-97.6 ?F (36.4 ?C)] 97.5 ?F (36.4 ?C) (03/15 0354) ?Pulse Rate:  [48-51] 48 (03/15 0354) ?Resp:  [15-18] 15 (03/15 0354) ?BP: (112-125)/(57-65) 125/59 (03/15 0354) ?SpO2:  [93 %-96 %] 93 % (03/15 0354) ?Last BM Date : 02/03/22 ?General:   older white female in NAD husband at bedside ?Heart:  Regular rate and rhythm; no murmurs ?Lungs: Respirations even bilateral scattered rhonchi ?Abdomen:  Soft, nontender and nondistended. Normal bowel sounds. ?Extremities: 2+ edema bilateral lower extremities decreased since yesterday ?Neurologic:  Alert and oriented,  grossly normal neurologically.  No asterixis ?Psych:  Cooperative. Normal mood and affect. ? ?Intake/Output from previous day: ?03/14 0701 - 03/15 0700 ?In: 100 [IV Piggyback:100] ?Out: 1000 [Urine:1000] ?Intake/Output this shift: ?No intake/output data recorded. ? ?Lab Results: ?Recent Labs  ?  02/02/22 ?1710 02/03/22 ?0456 02/04/22 ?0539  ?WBC 3.1* 2.2* 4.3  ?HGB 11.5* 11.8* 11.3*  ?HCT 35.4* 35.7* 34.4*  ?PLT 81* 75* 97*  ? ?BMET ?Recent Labs  ?  02/02/22 ?1710 02/03/22 ?0456 02/04/22 ?7673  ?NA 139 139 143  ?K 3.8 4.1 3.9  ?CL 106 105 109  ?CO2 '26 25 26  ' ?GLUCOSE 85 125* 89  ?BUN '18 19 22  ' ?CREATININE 0.77 0.74 0.85  ?CALCIUM 8.9 8.6* 8.7*  ? ?LFT ?Recent Labs  ?  02/04/22 ?4193  ?PROT 6.6  ?ALBUMIN 2.7*  ?AST 102*  ?ALT 73*  ?ALKPHOS 286*  ?BILITOT 0.7  ? ?PT/INR ?Recent Labs  ?  02/02/22 ?1710  02/03/22 ?0456  ?LABPROT 18.7* 18.6*  ?INR 1.6* 1.6*  ? ? ?Studies/Results: ?DG Chest Portable 1 View ? ?Result Date: 02/02/2022 ?CLINICAL DATA:  Shortness of breath, cough, chest pain EXAM: PORTABLE CHEST 1 VIEW COMPARISON:  11/28/2020 FINDINGS: Cardiomegaly. Probable small left pleural effusion. The visualized skeletal structures are unremarkable. IMPRESSION: Cardiomegaly.  Probable small left pleural effusion. Electronically Signed   By: Delanna Ahmadi M.D.   On: 02/02/2022 17:39   ? ? ? ? Assessment / Plan:   ? ?#7 71 year old white female with history of chemo-induced noncirrhotic portal hypertension after treatment for cecal adenocarcinoma 2004, patient required TIPS 2012 for variceal hemorrhage/refractory and has since developed cirrhosis ?Current MELD equal 12 ? ?Admitted with lethargy, change in mental status felt secondary to hepatic encephalopathy, with exacerbation over the past 4 days secondary to infection, probable UTI and also clinically has an upper respiratory infection/bronchitis ? ?On ceftriaxone ? ?Mental status is at baseline, on increased dose of lactulose 30 g 3 times daily ? ?#2 cholelithiasis and gallbladder adenomyomatosis ?#3 peripheral edema-new-have started low-dose Lasix and Aldactone ? ?#4 colon cancer screening-significantly overdue for follow-up colonoscopy ?#5 variceal surveillance needs follow-up EGD ? ?Plan; anticipate she will be discharged tomorrow ?Continue high-protein/low sodium diet on discharge which they have been doing ?Continue Lasix 20 mg and Aldactone 50 mg p.o. daily ?Continue lactulose 30 g p.o. 3 times daily ? ?Our office is looking into options  for obtaining Xifaxan for her for chronic use for HE ? ?Will arrange for outpatient follow-up with Dr. Tarri Glenn, and we will also have her come to our lab next week for BMET  as we have initiated diuretics. ?She will need to be scheduled for outpatient colonoscopy and EGD at the time of her follow-up visit ? ?GI will sign  off ? ?Patient has appointment with Dr. Tarri Glenn, on 02/20/2022 at 3 PM. ? ? ?Principal Problem: ?  Acute metabolic encephalopathy ?Active Problems: ?  Hypothyroidism ?  Essential hypertension ?  Cirrhosis of liver ([place]) ?  UTI (urinary tract infection) ?  Hepatic encephalopathy ? ? ? ? LOS: 1 day  ? ?Amy Esterwood PA-C 02/04/2022, 8:49 AM ? ?GI ATTENDING ? ?Interval history and data reviewed.  Patient seen and examined.  Agree with interval progress note as outlined above.  As well, agree with assessment and plans as outlined without additions or deletions.  We will sign off.  GI follow-up has been arranged. ? ?Docia Chuck. Geri Seminole., M.D. ?[place] ?Division of Gastroenterology  ?  ?

## 2022-02-04 NOTE — Assessment & Plan Note (Addendum)
Multifactorial including hepatic encephalopathy and possible E. coli UTI.  Encephalopathy resolved. ?-Discharge on p.o. lactulose 30 g 3 times daily for goal of 2-3 BM a day. ?-Completed antibiotic course with IV ceftriaxone for 3 days for E. coli UTI ?

## 2022-02-04 NOTE — Hospital Course (Addendum)
71 year old F with PMH of colon cancer, cirrhosis due to chemotherapy, portal HTN, gastric varices, HTN, chronic pain and hypothyroidism presenting with altered mental status and cough, and admitted with working diagnosis of acute metabolic encephalopathy, acute hepatic encephalopathy and possible UTI.  Urine culture obtained.  Started on IV CTX.  Also started on lactulose.  GI consulted, and started Lasix and Aldactone for liver cirrhosis. Urine culture with pansensitive E. coli.  ? ?On the day of discharge, encephalopathy resolved.  Cleared for discharge by gastroenterology for outpatient follow-up.  She is discharged on p.o. lactulose, Lasix and Aldactone.  She has already completed 3 days of ceftriaxone for possible E. coli UTI. ?

## 2022-02-04 NOTE — Assessment & Plan Note (Addendum)
Likely due to liver cirrhosis.  Leukopenia and anemia stable.  Thrombocytopenia improved. ?-Recheck CBC at follow-up ?

## 2022-02-04 NOTE — Assessment & Plan Note (Addendum)
Urine culture with pansensitive E. coli. ?-Completed 3 days of IV ceftriaxone in-house. ?

## 2022-02-04 NOTE — Assessment & Plan Note (Signed)
-   Outpatient follow-up °

## 2022-02-04 NOTE — Assessment & Plan Note (Addendum)
Resolved

## 2022-02-04 NOTE — Progress Notes (Addendum)
?PROGRESS NOTE ? ?[name] JJH:[id number] DOB: 1951-05-12  ? ?PCP: Lesleigh Noe, MD ? ?Patient is from: Home.  Lives with husband.  Independently ambulates at baseline. ? ?DOA: 02/02/2022 LOS: 1 ? ?Chief complaints ?Chief Complaint  ?Patient presents with  ? Cough  ? chest congestion  ? lethargic  ?  ? 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  Urine culture obtained.  Started on IV CTX.  Also started on lactulose.  GI consulted, and started Lasix and Aldactone for liver cirrhosis.  ? ?Subjective: ?Seen and examined earlier this morning.  No major events overnight of this morning.  No complaints.  Confusion seems to have resolved.  Denies chest pain, dyspnea, GI or UTI symptoms ? ?Objective: ?Vitals:  ? 02/03/22 1137 02/03/22 1215 02/03/22 2115 02/04/22 0354  ?BP: 117/65  (!) 112/57 (!) 125/59  ?Pulse: (!) 51  (!) 49 (!) 48  ?Resp: '16  18 15  '$ ?Temp: 97.6 ?F (36.4 ?C)  (!) 97.5 ?F (36.4 ?C) (!) 97.5 ?F (36.4 ?C)  ?TempSrc: Oral  Oral Oral  ?SpO2: 96% 93% 93% 93%  ?Weight:      ?Height:      ? ? ?Examination: ? ?GENERAL: No apparent distress.  Nontoxic. ?HEENT: MMM.  Vision and hearing grossly intact.  ?NECK: Supple.  No apparent JVD.  ?RESP:  No IWOB.  Fair aeration bilaterally. ?CVS:  RRR. Heart sounds normal.  ?ABD/GI/GU: BS+. Abd soft, NTND.  ?MSK/EXT:  Moves extremities. No apparent deformity. No edema.  ?SKIN: no apparent skin lesion or wound ?NEURO: Awake, alert and oriented appropriately.  No apparent focal neuro deficit. ?PSYCH: Calm. Normal affect.  ? ?Procedures:  ?None ? ?Microbiology summarized: ?COVID-19 and influenza PCR nonreactive. ?Urine culture with E. coli. ? ?Assessment and Plan: ?* Acute  metabolic encephalopathy ?Multifactorial including hepatic encephalopathy and possible UTI.  Seems to be resolving.  She is oriented x4. ?-Continue lactulose for goal of 3-5 bowel movements per GI ?-Reorientation and delirium precautions. ?-Continue ceftriaxone for possible E. coli UTI ? ?Hepatic encephalopathy ?Continue lactulose for goal of 3-5 bowel movements per GI. ? ?Pancytopenia ([place]) ?Likely due to liver cirrhosis.  Leukopenia resolved.  Thrombocytopenia improved.  Anemia stable. ?-Recheck in the morning ? ?UTI (urinary tract infection) ?Urine culture with E. Coli ?-Continue ceftriaxone pending culture sensitivity ? ?Decompensated liver cirrhosis with portal HTN and gastric varices ?LFT stable.   ?-Appreciate help by GI-started Lasix and Aldactone. ?-Lactulose as above ?-GI to arrange outpatient follow-up and options for Xifaxan ? ? ?Hypomagnesemia ?Mg 1.6. ?-IV magnesium sulfate 2 g x 1 ? ?Essential hypertension ?Continue home losartan ? ?Hypothyroidism ?Continue home Synthroid ? ?Hx of colon cancer, stage III ?Outpatient follow-up ? ? ?Obesity ?Body mass index is 30.05 kg/m?. ?  ?  ?  ?  ?DVT prophylaxis:  ?SCDs Start: 02/02/22 2127 ? ?Code Status: Full code ?Family Communication: Updated patient's husband at bedside. ?Level of care: Med-Surg ?Status is: Inpatient ?Remains inpatient appropriate because: IV antibiotics for E. coli UTI pending culture sensitivity ? ? ?Final disposition: Home ? ?Consultants:  ?Gastroenterology ? ?Sch Meds:  ?Scheduled Meds: ? furosemide  20 mg Oral Daily  ? lactulose  30 g Oral TID  ? levOCARNitine  330 mg Oral TID  ? levothyroxine  75 mcg Oral QAC breakfast  ? losartan  50 mg Oral QHS  ? sodium chloride flush  3 mL Intravenous Q12H  ? spironolactone  50 mg Oral Daily  ? zinc sulfate  220 mg Oral BID  ? ?Continuous Infusions: ? cefTRIAXone (ROCEPHIN)  IV Stopped (02/03/22 2130)  ? ?PRN Meds:.acetaminophen **OR** acetaminophen ? ?Antimicrobials: ?Anti-infectives (From  admission, onward)  ? ? Start     Dose/Rate Route Frequency Ordered Stop  ? 02/03/22 2000  cefTRIAXone (ROCEPHIN) 1 g in sodium chloride 0.9 % 100 mL IVPB       ? 1 g ?200 mL/hr over 30 Minutes Intravenous Every 24 hours 02/02/22 2128    ? 02/02/22 2045  cefTRIAXone (ROCEPHIN) 1 g in sodium chloride 0.9 % 100 mL IVPB       ? 1 g ?200 mL/hr over 30 Minutes Intravenous  Once 02/02/22 2035 02/02/22 2208  ? ?  ? ? ? ?I have personally reviewed the following labs and images: ?CBC: ?Recent Labs  ?Lab 02/02/22 ?1710 02/03/22 ?0456 02/04/22 ?2585  ?WBC 3.1* 2.2* 4.3  ?NEUTROABS 2.3  --  3.0  ?HGB 11.5* 11.8* 11.3*  ?HCT 35.4* 35.7* 34.4*  ?MCV 106.3* 103.8* 104.9*  ?PLT 81* 75* 97*  ? ?BMP &GFR ?Recent Labs  ?Lab 02/02/22 ?1710 02/03/22 ?0456 02/04/22 ?2778  ?NA 139 139 143  ?K 3.8 4.1 3.9  ?CL 106 105 109  ?CO2 '26 25 26  '$ ?GLUCOSE 85 125* 89  ?BUN '18 19 22  '$ ?CREATININE 0.77 0.74 0.85  ?CALCIUM 8.9 8.6* 8.7*  ?MG  --   --  1.6*  ? ?Estimated Creatinine Clearance: 65.1 mL/min (by C-G formula based on SCr of 0.85 mg/dL). ?Liver & Pancreas: ?Recent Labs  ?Lab 02/02/22 ?1710 02/03/22 ?0456 02/04/22 ?2423  ?AST 111* 103* 102*  ?ALT 79* 73* 73*  ?ALKPHOS 286* 282* 286*  ?BILITOT 1.1 0.9 0.7  ?PROT 6.8 6.7 6.6  ?ALBUMIN 2.8* 2.7* 2.7*  ? ?No results for input(s): LIPASE, AMYLASE in the last 168 hours. ?Recent Labs  ?Lab 02/02/22 ?1730 02/04/22 ?5361  ?AMMONIA 45* 56*  ? ?Diabetic: ?No results for input(s): HGBA1C in the last 72 hours. ?No results for input(s): GLUCAP in the last 168 hours. ?Cardiac Enzymes: ?Recent Labs  ?Lab 02/04/22 ?4431  ?CKTOTAL 61  ? ?No results for input(s): PROBNP in the last 8760 hours. ?Coagulation Profile: ?Recent Labs  ?Lab 02/02/22 ?1710 02/03/22 ?0456  ?INR 1.6* 1.6*  ? ?Thyroid Function Tests: ?No results for input(s): TSH, T4TOTAL, FREET4, T3FREE, THYROIDAB in the last 72 hours. ?Lipid Profile: ?No results for input(s): CHOL, HDL, LDLCALC, TRIG, CHOLHDL, LDLDIRECT in the last 72 hours. ?Anemia  Panel: ?No results for input(s): VITAMINB12, FOLATE, FERRITIN, TIBC, IRON, RETICCTPCT in the last 72 hours. ?Urine analysis: ?   ?Component Value Date/Time  ? [place] YELLOW 02/02/2022 1805  ? APPEARANCEUR HAZY (A) 02/02/2022 1805  ? LABSPEC 1.011 02/02/2022 1805  ? PHURINE 7.0 02/02/2022 1805  ? [place] NEGATIVE 02/02/2022 1805  ? [place] NEGATIVE 02/02/2022 1805  ? [place] NEGATIVE 02/02/2022 1805  ? [place] NEGATIVE 02/02/2022 1805  ? [place] NEGATIVE 02/02/2022 1805  ? UROBILINOGEN 0.2 12/20/2010 1449  ? NITRITE POSITIVE (A) 02/02/2022 1805  ? LEUKOCYTESUR LARGE (A) 02/02/2022 1805  ? ?Sepsis Labs: ?Invalid input(s): PROCALCITONIN, LACTICIDVEN ? ?Microbiology: ?Recent Results (from the past 240 hour(s))  ?COVID-19, Flu A+B and RSV ([facility])     Status: None  ? Collection Time: 02/02/22  3:40 PM  ?Result Value Ref  Range Status  ? SARS-CoV-2, NAA Not Detected Not Detected Final  ? Influenza A, NAA Not Detected Not Detected Final  ? Influenza B, NAA Not Detected Not Detected Final  ? RSV, NAA Not Detected Not Detected Final  ? Test Information: Comment  Final  ?  Comment: This nucleic acid amplification test was developed and its performance ?characteristics determined by [employer]. Nucleic acid ?amplification tests include RT-PCR and TMA. This test has not been ?FDA cleared or approved. This test has been authorized by FDA under ?an Emergency Use Authorization (EUA). This test is only authorized ?for the duration of time the declaration that circumstances exist ?justifying the authorization of the emergency use of in vitro ?diagnostic tests for detection of SARS-CoV-2 virus and/or diagnosis ?of COVID-19 infection under section 564(b)(1) of the Act, 21 U.S.C. ?360bbb-3(b) (1), unless the authorization is terminated or revoked ?sooner. ?When diagnostic testing is negative, the possibility of a false ?negative result should be considered in the context of a patient's ?recent exposures and the  presence of clinical signs and symptoms ?consistent with COVID-19. An individual without symptoms of COVID-19 ?and who is not shedding SARS-CoV-2 virus wo uld expect to have a ?negative (not detected) result in this assay. ?

## 2022-02-04 NOTE — Assessment & Plan Note (Addendum)
LFT stable.  No ascites.  Low suspicion for SBP. ?-Continue lactulose, Lasix and Aldactone per GI recommendation ?-GI to arrange outpatient follow-up and options for Xifaxan ? ?

## 2022-02-04 NOTE — Evaluation (Signed)
Physical Therapy Evaluation ?Patient Details ?Name: [name] ?MRN: [id number] ?DOB: 11/17/1951 ?Today's Date: 02/04/2022 ? ?History of Present Illness ? 71 y.o. female with medical history significant of colon cancer, cirrhosis secondary to chemo, portal hypertension, gastric varices, hypothyroidism, chronic pain, hypertension presented with altered mental status and cough,ammonia was 45.  UA was suggestive of UTI.  Chest x-ray showed cardiomegaly and probable small left effusion.  ?Clinical Impression ? The patient  has been ambulating in room, spouse present. Patient ambulated x 240' using Rw. Patient reports  that she and spouse  walk a track(she  miles) 2-3  x's a day, patient still works as a Licensed conveyancer. ? Reviewed  exercises that  will benefit  patient, she has had PT in past. Patient  will gradually return to walking on the track. Spouse present and agrees. ?No further PT in acute care as patient  can ambulate with staff and family.PT will sign off ? ?   ? ?Recommendations for follow up therapy are one component of a multi-disciplinary discharge planning process, led by the attending physician.  Recommendations may be updated based on patient status, additional functional criteria and insurance authorization. ? ?Follow Up Recommendations No PT follow up ? ?  ?Assistance Recommended at Discharge Set up Supervision/Assistance  ?Patient can return home with the following ? Assistance with cooking/housework;Assist for transportation;Help with stairs or ramp for entrance ? ?  ?Equipment Recommendations None recommended by PT  ?Recommendations for Other Services ?    ?  ?Functional Status Assessment Patient has had a recent decline in their functional status and demonstrates the ability to make significant improvements in function in a reasonable and predictable amount of time.  ? ?  ?Precautions / Restrictions Precautions ?Precautions: Fall  ? ?  ? ?Mobility ? Bed Mobility ?Overal bed mobility: Modified  Independent ?  ?  ?  ?  ?  ?  ?  ?  ? ?Transfers ?Overall transfer level: Needs assistance ?Equipment used: Rolling walker (2 wheels) ?Transfers: Sit to/from Stand ?Sit to Stand: Supervision ?  ?  ?  ?  ?  ?  ?  ? ?Ambulation/Gait ?Ambulation/Gait assistance: Supervision ?Gait Distance (Feet): 240 Feet ?Assistive device: Rolling walker (2 wheels) ?Gait Pattern/deviations: Step-through pattern ?Gait velocity: decr ?  ?  ?General Gait Details: gait is steady, reports ambulates to BR without AD. ? ?Stairs ?  ?  ?  ?  ?  ? ?Wheelchair Mobility ?  ? ?Modified Rankin (Stroke Patients Only) ?  ? ?  ? ?Balance Overall balance assessment: Mild deficits observed, not formally tested ?  ?  ?  ?  ?  ?  ?  ?  ?  ?  ?  ?  ?  ?  ?  ?  ?  ?  ?   ? ? ? ?Pertinent Vitals/Pain Pain Assessment ?Pain Assessment: No/denies pain  ? ? ?Home Living Family/patient expects to be discharged to:: Private residence ?Living Arrangements: Spouse/significant other ?Available Help at Discharge: Family;Available 24 hours/day ?Type of Home: House ?Home Access: Ramped entrance ?  ?  ?  ?Home Layout: One level ?Home Equipment: Rolling Walker (2 wheels);BSC/3in1;Cane - single point ?   ?  ?Prior Function Prior Level of Function : Independent/Modified Independent ?  ?  ?  ?  ?  ?  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  ? ? ?Hand Dominance  ?   ? ?  ?Extremity/Trunk  Assessment  ? Upper Extremity Assessment ?Upper Extremity Assessment: Overall WFL for tasks assessed ?  ? ?Lower Extremity Assessment ?Lower Extremity Assessment: Generalized weakness ?  ? ?Cervical / Trunk Assessment ?Cervical / Trunk Assessment: Normal  ?Communication  ? Communication: No difficulties  ?Cognition Arousal/Alertness: Awake/alert ?Behavior During Therapy: [facility] for tasks assessed/performed ?Overall Cognitive Status: Within Functional Limits for tasks assessed ?  ?  ?  ?  ?  ?  ?  ?  ?  ?  ?  ?  ?  ?  ?  ?   ?  ?  ?  ? ?  ?General Comments   ? ?  ?Exercises    ? ?Assessment/Plan  ?  ?PT Assessment Patient does not need any further PT services  ?PT Problem List   ? ?   ?  ?PT Treatment Interventions     ? ?PT Goals (Current goals can be found in the Care Plan section)  ?Acute Rehab PT Goals ?Patient Stated Goal: to go back to work ?PT Goal Formulation: All assessment and education complete, DC therapy ? ?  ?Frequency   ?  ? ? ?Co-evaluation   ?  ?  ?  ?  ? ? ?  ?AM-PAC PT "6 Clicks" Mobility  ?Outcome Measure Help needed turning from your back to your side while in a flat bed without using bedrails?: None ?Help needed moving from lying on your back to sitting on the side of a flat bed without using bedrails?: None ?Help needed moving to and from a bed to a chair (including a wheelchair)?: A Little ?Help needed standing up from a chair using your arms (e.g., wheelchair or bedside chair)?: A Little ?Help needed to walk in hospital room?: A Little ?Help needed climbing 3-5 steps with a railing? : A Lot ?6 Click Score: 19 ? ?  ?End of Session   ?Activity Tolerance: Patient tolerated treatment well ?Patient left: in chair;with call bell/phone within reach;with family/visitor present ?Nurse Communication: Mobility status ?PT Visit Diagnosis: Unsteadiness on feet (R26.81) ?  ? ?Time: 1308-6578 ?PT Time Calculation (min) (ACUTE ONLY): 17 min ? ? ?Charges:   PT Evaluation ?$PT Eval Low Complexity: 1 Low ?  ?  ?   ? ? ?Tresa Endo PT ?Acute Rehabilitation Services ?Pager [phone] ?Office [phone] ? ? ?Tomasina Keasling, Shella Maxim ?02/04/2022, 2:33 PM ? ?

## 2022-02-04 NOTE — Assessment & Plan Note (Signed)
-   Continue home Synthroid °

## 2022-02-04 NOTE — Assessment & Plan Note (Addendum)
Decreased home losartan to 50 mg daily now she is on Lasix and Aldactone. ?

## 2022-02-05 ENCOUNTER — Other Ambulatory Visit: Payer: Self-pay | Admitting: Student

## 2022-02-05 DIAGNOSIS — I495 Sick sinus syndrome: Secondary | ICD-10-CM

## 2022-02-05 DIAGNOSIS — R001 Bradycardia, unspecified: Secondary | ICD-10-CM

## 2022-02-05 DIAGNOSIS — E876 Hypokalemia: Secondary | ICD-10-CM

## 2022-02-05 LAB — COMPREHENSIVE METABOLIC PANEL
ALT: 77 U/L — ABNORMAL HIGH (ref 0–44)
AST: 110 U/L — ABNORMAL HIGH (ref 15–41)
Albumin: 2.5 g/dL — ABNORMAL LOW (ref 3.5–5.0)
Alkaline Phosphatase: 274 U/L — ABNORMAL HIGH (ref 38–126)
Anion gap: 6 (ref 5–15)
BUN: 19 mg/dL (ref 8–23)
CO2: 26 mmol/L (ref 22–32)
Calcium: 8.1 mg/dL — ABNORMAL LOW (ref 8.9–10.3)
Chloride: 106 mmol/L (ref 98–111)
Creatinine, Ser: 0.8 mg/dL (ref 0.44–1.00)
GFR, Estimated: >60 mL/min (ref >60)
Glucose, Bld: 78 mg/dL (ref 70–99)
Potassium: 3.3 mmol/L — ABNORMAL LOW (ref 3.5–5.1)
Sodium: 138 mmol/L (ref 135–145)
Total Bilirubin: 0.9 mg/dL (ref 0.3–1.2)
Total Protein: 6.3 g/dL — ABNORMAL LOW (ref 6.5–8.1)

## 2022-02-05 LAB — CBC
HCT: 35 % — ABNORMAL LOW (ref 36.0–46.0)
Hemoglobin: 11.7 g/dL — ABNORMAL LOW (ref 12.0–15.0)
MCH: 34.6 pg — ABNORMAL HIGH (ref 26.0–34.0)
MCHC: 33.4 g/dL (ref 30.0–36.0)
MCV: 103.6 fL — ABNORMAL HIGH (ref 80.0–100.0)
Platelets: 122 10*3/uL — ABNORMAL LOW (ref 150–400)
RBC: 3.38 MIL/uL — ABNORMAL LOW (ref 3.87–5.11)
RDW: 16.6 % — ABNORMAL HIGH (ref 11.5–15.5)
WBC: 3.9 10*3/uL — ABNORMAL LOW (ref 4.0–10.5)
nRBC: 0 % (ref 0.0–0.2)

## 2022-02-05 LAB — AMMONIA: Ammonia: 31 umol/L (ref 9–35)

## 2022-02-05 LAB — PHOSPHORUS: Phosphorus: 3.8 mg/dL (ref 2.5–4.6)

## 2022-02-05 LAB — URINE CULTURE: Culture: >=100000 — AB

## 2022-02-05 LAB — MAGNESIUM: Magnesium: 1.7 mg/dL (ref 1.7–2.4)

## 2022-02-05 LAB — AFP TUMOR MARKER: AFP, Serum, Tumor Marker: <1.8 ng/mL (ref 0.0–9.2)

## 2022-02-05 MED ORDER — LACTULOSE 10 GM/15ML PO SOLN: 30.0000 g | Freq: Three times a day (TID) | ORAL | 1 refills | Status: DC

## 2022-02-05 MED ORDER — LOSARTAN POTASSIUM 50 MG PO TABS
50.0000 mg | ORAL_TABLET | Freq: Every day | ORAL | 1 refills | Status: DC
Start: 2022-02-05 — End: 2022-03-11

## 2022-02-05 MED ORDER — POTASSIUM CHLORIDE CRYS ER 20 MEQ PO TBCR
40.0000 meq | EXTENDED_RELEASE_TABLET | Freq: Once | ORAL | Status: AC
  Administered 2022-02-05: 40 meq via ORAL
  Filled 2022-02-05: qty 2

## 2022-02-05 MED ORDER — FUROSEMIDE 20 MG PO TABS: 20.0000 mg | ORAL_TABLET | Freq: Every day | ORAL | 1 refills | Status: DC

## 2022-02-05 MED ORDER — SPIRONOLACTONE 50 MG PO TABS: 50.0000 mg | ORAL_TABLET | Freq: Every day | ORAL | 1 refills | Status: DC

## 2022-02-05 NOTE — Assessment & Plan Note (Signed)
K3.3. ?-Received p.o. KCl 40x1 prior to discharge ?-She will continue p.o. Aldactone ?

## 2022-02-05 NOTE — Progress Notes (Signed)
0700-11:30 ?Patient's discharge instructions went over with patient, patient's medication list and new medications went over with patient. Patient wheeled down to lobby and patient's iv deaccessed.  ?

## 2022-02-05 NOTE — Progress Notes (Signed)
Resent scripts for Aldactone and Lasix ?

## 2022-02-05 NOTE — Discharge Summary (Signed)
? ?Physician Discharge Summary  ?[name] RAQ:[id number] DOB: [birth year] DOA: 02/02/2022 ? ?PCP: Lesleigh Noe, MD ? ?Admit date: 02/02/2022 ?Discharge date: 02/05/2022 ?Admitted From: Home ?Disposition: Home ?Recommendations for Outpatient Follow-up:  ?Follow ups as below. ?Please obtain CBC/CMP/Mag at follow up ?Please follow up on the following pending results: None ? ?Home Health: Not indicated ?Equipment/Devices: Not indicated ? ?Discharge Condition: Stable ?CODE STATUS: Full code ? Follow-up Information   ? ? Lesleigh Noe, MD. Schedule an appointment as soon as possible for a visit in 1 week(s).   ?Specialty: Family Medicine ?Contact information: ?[place] [address] ?[phone] ? ? ?  ?  ? ?  ?  ? ?  ? ? 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  Urine culture obtained.  Started on IV CTX.  Also started on lactulose.  GI consulted, and started Lasix and Aldactone for liver cirrhosis. Urine culture with pansensitive E. coli.  ? ?On the day of discharge, encephalopathy resolved.  Cleared for discharge by gastroenterology for outpatient follow-up.  She is discharged on p.o. lactulose, Lasix and Aldactone.  She has already completed 3 days of ceftriaxone for possible E. coli UTI.  ? ?See individual problem list below for more on hospital course. ? ?Problems addressed during this hospitalization ?Problem  ?Acute Metabolic Encephalopathy  ?Hepatic Encephalopathy  ?Pancytopenia ([facility])  ?Uti (Urinary Tract Infection)  ?Decompensated liver cirrhosis with portal HTN and gastric varices  ?Hypokalemia  ?Sinus Bradycardia  ?Hypomagnesemia  ?Essential Hypertension  ?Hypothyroidism  ?Hx of Colon Cancer, Stage III  ?  Adenocarcinoma of the cecum.  Stage IIIB, T3N1 with matted lymph node mass measuring 4 cm, S/P hemicolectomy and adjuvant FOLFOX x 12 cycles from 02/05/03-09/24/03.  NED.  Most recent colonoscopy was 06/25/10 ?  ?  ?Assessment and Plan: ?* Acute metabolic encephalopathy ?Multifactorial including hepatic encephalopathy and possible E. coli UTI.  Encephalopathy resolved. ?-Discharge on p.o. lactulose 30 g 3 times daily for goal of 2-3 BM a day. ?-Completed antibiotic course with IV ceftriaxone for 3 days for E. coli UTI ? ?Hepatic encephalopathy ?Lactulose as above. ? ?Pancytopenia ([place]) ?Likely due to liver cirrhosis.  Leukopenia and anemia stable.  Thrombocytopenia improved. ?-Recheck CBC at follow-up ? ?UTI (urinary tract infection) ?Urine culture with pansensitive E. coli. ?-Completed 3 days of IV ceftriaxone in-house. ? ?Decompensated liver cirrhosis with portal HTN and gastric varices ?LFT stable.  No ascites.  Low suspicion for SBP. ?-Continue lactulose, Lasix and Aldactone per GI recommendation ?-GI to arrange outpatient follow-up and options for Xifaxan ? ? ?Sinus bradycardia ?Not symptomatic.  Improved to 60s with activity.  She is not on nodal blocking agent or Aricept.  TSH within normal. ?-Avoid nodal blocking agents ? ?Hypokalemia ?K3.3. ?-Received p.o. KCl 40x1 prior to discharge ?-She will continue p.o. Aldactone ? ?Hypomagnesemia ?Resolved. ? ?Essential hypertension ?Decreased home losartan to 50 mg daily now she is on Lasix and Aldactone. ? ?Hypothyroidism ?Continue home Synthroid ? ?Hx of colon cancer, stage III ?Outpatient follow-up ? ? ? ? ?  ?  ?  ?  ? ?  ? ?Vital signs ?Vitals:  ? 02/04/22 0354 02/04/22 1749 02/04/22 1952 02/05/22 0431  ?BP: (!) 125/59 117/63 126/64 134/75  ?Pulse: (!) 48 (!) 50 (!) 49 Marland Kitchen)  46  ?Temp: (!) 97.5 ?F (36.4 ?C) (!) 97.5 ?F (36.4 ?C) (!) 97.5 ?F (36.4 ?C) (!) 97.4 ?F (36.3 ?C)  ?Resp: '15 18 16 16  '$ ?Height:      ?Weight:      ?SpO2: 93% 94% 96% 94%  ?TempSrc: Oral Oral Oral  Oral  ?BMI (Calculated):      ?  ? ?Discharge exam ? ?GENERAL: No apparent distress.  Nontoxic. ?HEENT: MMM.  Vision and hearing grossly intact.  ?NECK: Supple.  No apparent JVD.  ?RESP:  No IWOB.  Fair aeration bilaterally. ?CVS: HR in 35s.  Regular rhythm.  Heart sounds normal.  ?ABD/GI/GU: BS+. Abd soft, NTND.  ?MSK/EXT:  Moves extremities. No apparent deformity.  Trace BLE edema. ?SKIN: no apparent skin lesion or wound ?NEURO: Awake and alert. Oriented appropriately.  No apparent focal neuro deficit. ?PSYCH: Calm. Normal affect.  ? ?Discharge Instructions ?Discharge Instructions   ? ? Call MD for:  difficulty breathing, headache or visual disturbances   Complete by: As directed ?  ? Call MD for:  extreme fatigue   Complete by: As directed ?  ? Call MD for:  persistant nausea and vomiting   Complete by: As directed ?  ? Call MD for:  severe uncontrolled pain   Complete by: As directed ?  ? Call MD for:  temperature >100.4   Complete by: As directed ?  ? Diet - low sodium heart healthy   Complete by: As directed ?  ? Discharge instructions   Complete by: As directed ?  ? It has been a pleasure taking care of you! ? ?You were hospitalized due to altered mental status which could be related to liver cirrhosis or possible urinary tract infection.  You have been started on medications for liver cirrhosis and elevated ammonia.  We are discharging you more of these medications to continue taking at home.  You have completed 3 days course of appropriate antibiotic for possible urinary tract infection.  Please review your new medication list and the directions on your medications before you take them. ? ?In addition to taking your medications as prescribed, we also recommend you avoid alcohol or over-the-counter pain medication other than plain Tylenol, limit the amount of water/fluid you drink to less than 6 cups (1500 cc) a day and your sodium (salt) intake to less than 2 g (2000 mg) a day. ? ?Follow-up with your  primary care doctor in 1 to 2 weeks or sooner if needed.  Our gastroenterologist office will arrange outpatient follow-up.  ? ? ?Take care,  ? Increase activity slowly   Complete by: As directed ?  ? ?  ? ?Allergies as of 02/05/2022   ? ?   Reactions  ? Contrast Media [iodinated Contrast Media] Hives  ? ?  ? ?  ?Medication List  ?  ? ?STOP taking these medications   ? ?Biotin 10000 MCG Tabs ?  ? ?  ? ?TAKE these medications   ? ?acetaminophen 500 MG tablet ?Commonly known as: TYLENOL ?Take 500 mg by mouth every 6 (six) hours as needed for mild pain or headache. ?  ?E[place] 575 MG/5ML Syrp ?Take 15 mLs by mouth at bedtime. ?  ?Enulose 10 GM/15ML Soln ?Generic drug: lactulose (encephalopathy) ?Take 30 g by mouth 3 (three) times daily. ?  ?furosemide 20 MG tablet ?Commonly known as: LASIX ?Take 1 tablet (20 mg total) by mouth daily. ?Start taking on: February 06, 2022 ?  ?L-ORNITHINE PO ?Take 6 g  by mouth daily. ?  ?lactulose 10 GM/15ML solution ?Commonly known as: [place] ?Take 45 mLs (30 g total) by mouth 3 (three) times daily. ?  ?levOCARNitine 330 MG tablet ?Commonly known as: CARNITOR ?Take 330 mg by mouth 3 (three) times daily. ?  ?losartan 50 MG tablet ?Commonly known as: COZAAR ?Take 1 tablet (50 mg total) by mouth at bedtime. ?What changed:  ?medication strength ?how much to take ?when to take this ?  ?Multi-Vitamin tablet ?Take 1 tablet by mouth daily. ?  ?spironolactone 50 MG tablet ?Commonly known as: ALDACTONE ?Take 1 tablet (50 mg total) by mouth daily. ?Start taking on: February 06, 2022 ?  ?Synthroid 75 MCG tablet ?Generic drug: levothyroxine ?TAKE 1 TABLET BY MOUTH DAILY BEFORE BREAKFAST ?What changed: how much to take ?  ?vitamin B-12 1000 MCG tablet ?Commonly known as: CYANOCOBALAMIN ?Take 1,000 mcg by mouth in the morning. ?  ?vitamin C 500 MG tablet ?Commonly known as: ASCORBIC ACID ?Take 1,500 mg by mouth at bedtime. ?  ?Vitamin D3 50 MCG (2000 UT) Tabs ?Take 2,000 Units by mouth at bedtime. ?   ?zinc gluconate 50 MG tablet ?Take 150 mg by mouth in the morning, at noon, and at bedtime. ?  ? ?  ? ? ?Consultations: ?Gastroenterology ? ?Procedures/Studies: ? ? ?DG Chest Portable 1 View ? ?Result Date: 3/13/202

## 2022-02-05 NOTE — Assessment & Plan Note (Signed)
Not symptomatic.  Improved to 60s with activity.  She is not on nodal blocking agent or Aricept.  TSH within normal. ?-Avoid nodal blocking agents ?

## 2022-02-06 ENCOUNTER — Telehealth: Payer: Self-pay

## 2022-02-06 NOTE — Telephone Encounter (Signed)
Transition Care Management Follow-up Telephone Call ?Date of discharge and from where: 02/05/22 from [facility] ?How have you been since you were released from the hospital? Patient states feeling fine since discharge ?Any questions or concerns? No ? ?Items Reviewed: ?Did the pt receive and understand the discharge instructions provided? Yes  ?Medications obtained and verified? Yes  ?Other? No  ?Any new allergies since your discharge? No  ?Dietary orders reviewed? Yes ?Do you have support at home? Yes  ? ?Home Care and Equipment/Supplies: ?Were home health services ordered? no ?If so, what is the name of the agency? N/A  ?Has the agency set up a time to come to the patient's home? not applicable ?Were any new equipment or medical supplies ordered?  No ?What is the name of the medical supply agency? N/A ?Were you able to get the supplies/equipment? not applicable ?Do you have any questions related to the use of the equipment or supplies? No ? ?Functional Questionnaire: (I = Independent and D = Dependent) ?ADLs: I ? ?Bathing/Dressing- I ? ?Meal Prep- I ? ?Eating- I ? ?Maintaining continence- I ? ?Transferring/Ambulation- I ? ?Managing Meds- I ? ?Follow up appointments reviewed: ? ?PCP Hospital f/u appt confirmed? Yes  Scheduled to see Dr. Einar Pheasant on 02/09/22 @ 2:00pm. ?[facility] f/u appt confirmed? No  , Specialist not indicated  ?Are transportation arrangements needed? No  ?If their condition worsens, is the pt aware to call PCP or go to the Emergency Dept.? Yes ?Was the patient provided with contact information for the PCP's office or ED? Yes ?Was to pt encouraged to call back with questions or concerns? Yes ? ?

## 2022-02-09 ENCOUNTER — Other Ambulatory Visit: Payer: Self-pay

## 2022-02-09 ENCOUNTER — Encounter: Payer: Self-pay | Admitting: Family Medicine

## 2022-02-09 ENCOUNTER — Ambulatory Visit (INDEPENDENT_AMBULATORY_CARE_PROVIDER_SITE_OTHER): Payer: Medicare Other | Admitting: Family Medicine

## 2022-02-09 VITALS — BP 100/64 | HR 64 | Ht 65.0 in | Wt 168.0 lb

## 2022-02-09 DIAGNOSIS — K7460 Unspecified cirrhosis of liver: Secondary | ICD-10-CM

## 2022-02-09 LAB — COMPREHENSIVE METABOLIC PANEL
ALT: 87 U/L — ABNORMAL HIGH (ref 0–35)
AST: 122 U/L — ABNORMAL HIGH (ref 0–37)
Albumin: 3.1 g/dL — ABNORMAL LOW (ref 3.5–5.2)
Alkaline Phosphatase: 320 U/L — ABNORMAL HIGH (ref 39–117)
BUN: 29 mg/dL — ABNORMAL HIGH (ref 6–23)
CO2: 30 mEq/L (ref 19–32)
Calcium: 10.1 mg/dL (ref 8.4–10.5)
Chloride: 101 mEq/L (ref 96–112)
Creatinine, Ser: 1.19 mg/dL (ref 0.40–1.20)
GFR: 46.26 mL/min — ABNORMAL LOW (ref >60.00)
Glucose, Bld: 69 mg/dL — ABNORMAL LOW (ref 70–99)
Potassium: 4.6 mEq/L (ref 3.5–5.1)
Sodium: 136 mEq/L (ref 135–145)
Total Bilirubin: 1 mg/dL (ref 0.2–1.2)
Total Protein: 7 g/dL (ref 6.0–8.3)

## 2022-02-09 LAB — CBC
HCT: 38.2 % (ref 36.0–46.0)
Hemoglobin: 12.8 g/dL (ref 12.0–15.0)
MCHC: 33.5 g/dL (ref 30.0–36.0)
MCV: 102.4 fl — ABNORMAL HIGH (ref 78.0–100.0)
Platelets: 221 10*3/uL (ref 150.0–400.0)
RBC: 3.73 Mil/uL — ABNORMAL LOW (ref 3.87–5.11)
RDW: 16 % — ABNORMAL HIGH (ref 11.5–15.5)
WBC: 5.7 10*3/uL (ref 4.0–10.5)

## 2022-02-09 LAB — MAGNESIUM: Magnesium: 1.7 mg/dL (ref 1.5–2.5)

## 2022-02-09 NOTE — Progress Notes (Signed)
? ?Subjective:  ? ?  ?[name] is a 71 y.o. female presenting for Hospitalization Follow-up ?  ? ? ?HPI ? ?#Cirrohis ?- urine is clear and more frequent - prior to this was having dark urine ?- lactulose is TID but the dose is increased to 4 table spoons ?- BM daily - 1-2  ?- today was a busy day work  ?- testing is coming up - this weekend was doing things for work was doing well and focusing on everything ?- husband thinks she was a little overwhelmed ?- alert enough to know something is missing but misplaced things ?- prior to going to the hospital had slept for 3 days straight ?- more awake alert ?- doing normal routine ? ?Cough ?- started 10 days ago ?- initially thought that she had a cold ?- negative covid ?- runny nose improving ? ?Had lost 30lbs when she saw GI and increased protein to be 90 gms ?Now they are working on the balance between exercise and dietary intake to avoid abnormal weight gain ? ?Sees Dr. Tarri Glenn on 3/31 ? ? ? ?Review of Systems  ?Constitutional:  Negative for chills and fever.  ?Respiratory:  Positive for cough.   ?Cardiovascular:  Negative for chest pain.  ? ?3/13-3/16/2023:Admission - encephalopathy - treated with CTX and lactulose. Started lasix and aldactone for liver cirrhosis. UTI with Ecoli completed treatment ?- needs CBC/CMP/Mag ? ?Social History  ? ?Tobacco Use  ?Smoking Status Never  ?Smokeless Tobacco Never  ? ? ? ?   ?Objective:  ?  ?BP Readings from Last 3 Encounters:  ?02/09/22 100/64  ?02/05/22 134/75  ?02/02/22 (!) 164/85  ? ?Wt Readings from Last 3 Encounters:  ?02/09/22 168 lb (76.2 kg)  ?02/02/22 180 lb 8.9 oz (81.9 kg)  ?12/23/21 181 lb 6 oz (82.3 kg)  ? ? ?BP 100/64 (BP Location: Left Arm, Patient Position: Sitting, Cuff Size: Large)   Pulse 64   Ht '5\' 5"'$  (1.651 m)   Wt 168 lb (76.2 kg)   SpO2 97%   BMI 27.96 kg/m?  ? ? ?Physical Exam ?Constitutional:   ?   General: She is not in acute distress. ?   Appearance: She is well-developed. She is not  diaphoretic.  ?HENT:  ?   Right Ear: External ear normal.  ?   Left Ear: External ear normal.  ?   Nose: Nose normal.  ?Eyes:  ?   Conjunctiva/sclera: Conjunctivae normal.  ?Cardiovascular:  ?   Rate and Rhythm: Normal rate and regular rhythm.  ?   Heart sounds: Murmur heard.  ?Pulmonary:  ?   Effort: Pulmonary effort is normal. No respiratory distress.  ?   Breath sounds: Normal breath sounds. No wheezing.  ?Musculoskeletal:  ?   Cervical back: Neck supple.  ?   Right lower leg: Edema (trace in compression socks) present.  ?   Left lower leg: Edema present.  ?Skin: ?   General: Skin is warm and dry.  ?   Capillary Refill: Capillary refill takes less than 2 seconds.  ?Neurological:  ?   Mental Status: She is alert. Mental status is at baseline.  ?Psychiatric:     ?   Mood and Affect: Mood normal.     ?   Behavior: Behavior normal.  ? ? ? ? ?Oriented to self, location, date. Able to do simple math. Not oriented to current president Barbette Or), did know previous president (Trump) ?   ?Assessment & Plan:  ? ?Problem List  Items Addressed This Visit   ? ?  ? Digestive  ? Decompensated liver cirrhosis with portal HTN and gastric varices - Primary  ?  She follows with Dr. Tarri Glenn, she has been taking her lactulose 40 g 3 times a day.  She cannot remember the president but otherwise is alert and oriented and was able to do presentation for work today.  He is having 1-2 bowel movements per day on her current treatment.  She has been taking spironolactone and Lasix and is breathing okay. We will get labs to evaluate electrolytes.  Appreciate GI support.  Counseled on warning signs for encephalopathy family is aware and will watch for these things. ?  ?  ? Relevant Orders  ? CBC  ? Comprehensive metabolic panel  ?  ? Other  ? Hypomagnesemia  ? Relevant Orders  ? Magnesium  ? ? ? ?Return in about 3 months (around 05/12/2022) for check in. ? ?Lesleigh Noe, MD ? ?This visit occurred during the SARS-CoV-2 public health emergency.   Safety protocols were in place, including screening questions prior to the visit, additional usage of staff PPE, and extensive cleaning of exam room while observing appropriate contact time as indicated for disinfecting solutions.  ? ?

## 2022-02-09 NOTE — Patient Instructions (Addendum)
Continue taking your medications ? ?Please update if confusion returns or Jeani Hawking is more sleepy or confused please call our office or Dr. Tarri Glenn office ? ?Labs today ? ? ?

## 2022-02-09 NOTE — Assessment & Plan Note (Signed)
She follows with Dr. Tarri Glenn, she has been taking her lactulose 40 g 3 times a day.  She cannot remember the president but otherwise is alert and oriented and was able to do presentation for work today.  He is having 1-2 bowel movements per day on her current treatment.  She has been taking spironolactone and Lasix and is breathing okay. We will get labs to evaluate electrolytes.  Appreciate GI support.  Counseled on warning signs for encephalopathy family is aware and will watch for these things. ?

## 2022-02-11 ENCOUNTER — Encounter: Payer: Self-pay | Admitting: Family Medicine

## 2022-02-11 DIAGNOSIS — N179 Acute kidney failure, unspecified: Secondary | ICD-10-CM

## 2022-02-12 ENCOUNTER — Telehealth: Payer: Self-pay

## 2022-02-12 NOTE — Telephone Encounter (Signed)
Samples have arrived (currently being held in Laconda Basich's office for pick up). Called pt to make her aware she may stop by to pick up. Unable to LVM d/t no VM. Phone rings x4, then beeps repeatedly. ?

## 2022-02-12 NOTE — Telephone Encounter (Signed)
-----   Message from Alfredia Ferguson, PA-C sent at 02/04/2022 11:45 AM EDT ----- ?Regarding: RE: Xifaxan ?Yes if we can get it- ? ?Also can you please get her a follow-up visit with Dr. Tarri Glenn, first available, for cirrhosis, hepatic encephalopathy, and peripheral edema ? ?You want to secure chat me with that appointment time I will put it in her chart ? ?Also needs to come to our office next Wednesday for a BMET-thank you ?----- Message ----- ?From: Aleatha Borer, LPN ?Sent: 02/03/2022   2:44 PM EDT ?To: Alfredia Ferguson, PA-C, Audrea Muscat, CMA ?Subject: RE: Xifaxan                                   ? ?Are you wanting pt to be on long term? ?----- Message ----- ?From: Alfredia Ferguson, PA-C ?Sent: 02/03/2022   2:42 PM EDT ?To: Aleatha Borer, LPN ?Subject: RE: Xifaxan                                   ? ?We can , but unlikely to be able to continue  to supply ? If you think we can get enough samples for her to stay on it that would be great - ?----- Message ----- ?From: Aleatha Borer, LPN ?Sent: 02/03/2022   2:20 PM EDT ?To: Alfredia Ferguson, PA-C, Audrea Muscat, CMA ?Subject: FW: Xifaxan                                   ? ?A PA was completed for Xifaxan. However, pt copay for Xifaxan is $1200.00, regardless of PA approval. Could we please obtain Xifaxan samples for this pt? ? ?Letizia Hook ?----- Message ----- ?From: Alfredia Ferguson, PA-C ?Sent: 02/03/2022   2:16 PM EDT ?To: Aleatha Borer, LPN ?Subject: Xifaxan                                       ? ?This nice lady is a patient of Dr. Tarri Glenn, currently in the hospital. ?She has cirrhosis and history of mild hepatic encephalopathy ? ?Admitted with confusion/encephalopathy in setting of UTI etc. ? ?Apparently her insurance would not cover Xifaxan in the past and her husband said it would cause them $1200 ?She is on fairly high-dose lactulose ? ?Can you please figure out if there is any way to get Xifaxan covered for her, do we need to get it prior Auth, is there  patient assistance available etc. ?She would really benefit from being able to be on Xifaxan 550 twice daily ? ? ? ? ? ?

## 2022-02-12 NOTE — Telephone Encounter (Signed)
In addition to informing pt about samples, wanting to f/u on status of her applying for patient assistance and to remind she is due to complete BMET. ?

## 2022-02-13 NOTE — Telephone Encounter (Signed)
SECOND ATTEMPT: ? ?Called pt to remind to complete BMP, f/u on completion of PAP and to pick up samples. LVM requesting returned call. ?

## 2022-02-16 NOTE — Telephone Encounter (Signed)
THIRD ATTEMPT: ? ?Appears labs still have not been completed. LVM requesting returned call. ?

## 2022-02-17 NOTE — Telephone Encounter (Signed)
FINAL ATTEMPT: ? ?Called pt to remind to complete BMP prior to 02/20/22 appt with Dr. Tarri Glenn. In addition, wanted to determine if pt had completed patient assistance application for financial assistance with Xifaxan and to advise her that we have 1 COURSE of Xifaxan samples available for pick up. LVM requesting returned call. Routing this message to Dr. Tarri Glenn and her CMA to make them aware of failed attempts to reach pt so they may address with pt during her upcoming her appt. ?

## 2022-02-20 ENCOUNTER — Encounter: Payer: Self-pay | Admitting: Gastroenterology

## 2022-02-20 ENCOUNTER — Other Ambulatory Visit (INDEPENDENT_AMBULATORY_CARE_PROVIDER_SITE_OTHER): Payer: Medicare Other

## 2022-02-20 ENCOUNTER — Ambulatory Visit: Payer: Medicare Other | Admitting: Gastroenterology

## 2022-02-20 VITALS — BP 112/62 | HR 68 | Ht 65.0 in | Wt 159.0 lb

## 2022-02-20 DIAGNOSIS — K7682 Hepatic encephalopathy: Secondary | ICD-10-CM

## 2022-02-20 DIAGNOSIS — K7460 Unspecified cirrhosis of liver: Secondary | ICD-10-CM

## 2022-02-20 LAB — BASIC METABOLIC PANEL
BUN: 47 mg/dL — ABNORMAL HIGH (ref 6–23)
CO2: 27 mEq/L (ref 19–32)
Calcium: 10.1 mg/dL (ref 8.4–10.5)
Chloride: 101 mEq/L (ref 96–112)
Creatinine, Ser: 1.5 mg/dL — ABNORMAL HIGH (ref 0.40–1.20)
GFR: 35.03 mL/min — ABNORMAL LOW (ref >60.00)
Glucose, Bld: 100 mg/dL — ABNORMAL HIGH (ref 70–99)
Potassium: 4.8 mEq/L (ref 3.5–5.1)
Sodium: 134 mEq/L — ABNORMAL LOW (ref 135–145)

## 2022-02-20 MED ORDER — LACTULOSE 10 GM/15ML PO SOLN: 30.0000 g | Freq: Three times a day (TID) | ORAL | 2 refills | Status: AC

## 2022-02-20 NOTE — Patient Instructions (Addendum)
If you are age 71 or older, your body mass index should be between 23-30. Your Body mass index is 26.46 kg/m?Marland Kitchen If this is out of the aforementioned range listed, please consider follow up with your Primary Care Provider. ? ?If you are age 94 or younger, your body mass index should be between 19-25. Your Body mass index is 26.46 kg/m?Marland Kitchen If this is out of the aformentioned range listed, please consider follow up with your Primary Care Provider.  ? ?________________________________________________________ ? ?The [facility] providers would like to encourage you to use [facility] to communicate with providers for non-urgent requests or questions.  Due to long hold times on the telephone, sending your provider a message by [facility] may be a faster and more efficient way to get a response.  Please allow 48 business hours for a response.  Please remember that this is for non-urgent requests.  ?_______________________________________________________ ? ?Continue lactulose 49m TID (refilled for 3 months with 3 refills). ? ?We are giving you a form to complete for patient assistance for Xifaxan.  [facility].  ?. ?Continue zinc sulfate 220 mg 3 times daily and levocarnitine 330 mg 3 times daily.  ? ?Please go to the lab in the basement of our building to have lab work done as you leave today. Hit "B" for basement when you get on the elevator.  When the doors open the lab is on your left.  We will call you with the results. Thank you. ? ?[facility] ? ?Thank you for entrusting me with your care and for choosing [facility] ?Dr. KThornton Park? ? ?

## 2022-02-20 NOTE — Progress Notes (Signed)
? ?Referring Provider: Lesleigh Noe, MD ?Primary Care Physician:  Lesleigh Noe, MD ? ? ?Chief complaint:  Weight loss ? ? ?IMPRESSION:  ?Cirrhosis with portal hypertension ?   - initially Oxiplatin-induced non-cirrhotic portal hypertension ?   - now with suspected cirrhosis by ultrasound, associated thrombocytopenia ?Hepatic encephalopathy requiring hospitalization ?   - Xifaxan is cost prohibitive ?   - Continue zinc sulfate, lactulose, and levocarnitine ?   - altered sleep habits suggest at least grade 1 HE ?   - work on building muscle mass ?Variceal bleed 2012 s/p TIPS ?   - TIPS patent on recent imaging ?   - last EGD 2010 ?   - [facility] recommends EGD to screen for redevelopment of varices ?History of stage IIIb cecal adenocarcinoma s/p surgery and FOLFAX 2004 ?   - last colonoscopy 2015 ?   - overdue surveillance colonoscopy despite prior recommendations to schedule ?Cholelithiasis and gallbladder adenomyomatosis ? ? ?Discussed hypertension because her blood pressure is actually higher than I would expect in the setting of cirrhosis with associated portal hypertension.  ? ?Hypoglycemia noted on recent labs.  ? ? ?PLAN: ?- Continue lactulose 2m TID (refilled for 3 months with 3 refills) ?- Add Xifaxan 550 mg BID (they will apply for patient assistance) ?- Continue zinc sulfate 220 mg 3 times daily and levocarnitine 330 mg 3 times daily.  ?- Doppler abdominal ultrasound to follow-up TIPS July 2023 ?- EGD to screen for esophageal varices ?- Surveillance colonoscopy ?- BMP ?- Outpatient Palliative Care Consult: Chronic symptom management will be helpful for the patient and her husband ?- [facility] to [facility] status ? ? ? 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 Her husband accompanies her to this appointment and contributes to the history. She has a history of chemo-induced  noncirrhotic portal hypertension after treatment for cecal adenocarcinoma in 2004 requiring TIPS at [facility] 2012 for variceal hemorrhage and subsequent additional complications of cirrhosis and portal hypertension.  ? ?She was hospitalized earlier in the month for hepatic encephalopathy attributed to probable UTI. ? ?Since she was hospitalized she is feeling better. Feels that the encephalopathy is better on lactulose, sinc, and levocarnitine. Continues to have trouble affording rifaxamin. She is sleeping 7 hours at night but continues to feel fatigued. She has returned to work but finds that she needs a nap for an hour or two every afternoon.  Her husband wonders if this is related to her blood pressure being too low.  ? ?He feels like it's in the 110-120/60s. Today her blood pressure is 112/62.  ? 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 Trying to get up to a mile. Going to a gym.  ? ?[facility] MELD 12,CPT B. Candidacy for transplant limited due to colon cancer history and advanced age. ? ?Doppler ultrasound 05/29/21: Patent TIPS, no velocity changes, no significant stenosis, cholelithiasis ?MRI with [facility] 06/09/21: gallstone, focal cystic nodularity at the gallbladder fundus , cirrhosis, TIPS, no HCC, normal spleen ?RUQ ultrasound 01/12/22: cirrhosis, patent TIPS not fully interrogated, gallstones ? ?Past Medical History:  ?Diagnosis Date  ? Cancer ([facility]   ? cecum  ? Elevated liver function tests   ? Esophageal varices (HCC)   ? Hemorrhage of gastrointestinal tract 05/04/2011  ? Hypothyroidism   ? Iron deficiency anemia   ?  Liver disease   ? chemotherapy complication, per pt, shunts placed to bypass liver  ? Malignant neoplasm of cecum ([place])   ? Portal hypertension (HCC)   ? Splenomegaly   ? ? ?Past Surgical History:  ?Procedure Laterality Date  ? ESOPHAGEAL VARICE LIGATION    ? HEMICOLECTOMY  01/08/03  ? IR RADIOLOGIST EVAL & MGMT  12/20/2020  ? IR  RADIOLOGIST EVAL & MGMT  05/29/2021  ? LIVER SURGERY    ? shunts placed after chemo complication  ? SKIN FULL THICKNESS GRAFT N/A 09/12/2019  ? Procedure: debridement and FTSG to the nose from left upper arm;  Surgeon: Cindra Presume, MD;  Location: [facility];  Service: Plastics;  Laterality: N/A;  2 hours, please  ? TIPS PROCEDURE    ? ? ?Current Outpatient Medications  ?Medication Sig Dispense Refill  ? Cholecalciferol (VITAMIN D3) 50 MCG (2000 UT) TABS Take 2,000 Units by mouth at bedtime.     ? Elderberry 575 MG/5ML SYRP Take 15 mLs by mouth at bedtime.    ? furosemide (LASIX) 20 MG tablet Take 1 tablet (20 mg total) by mouth daily. 30 tablet 1  ? lactulose (CHRONULAC) 10 GM/15ML solution Take 45 mLs (30 g total) by mouth 3 (three) times daily. 4050 mL 1  ? levOCARNitine (CARNITOR) 330 MG tablet Take 330 mg by mouth 3 (three) times daily.    ? losartan (COZAAR) 50 MG tablet Take 1 tablet (50 mg total) by mouth at bedtime. 30 tablet 1  ? Multiple Vitamin (MULTI-VITAMIN) tablet Take 1 tablet by mouth daily.    ? Ornithine 500 MG CAPS Take 1 capsule by mouth daily.    ? spironolactone (ALDACTONE) 50 MG tablet Take 1 tablet (50 mg total) by mouth daily. 30 tablet 1  ? SYNTHROID 75 MCG tablet TAKE 1 TABLET BY MOUTH DAILY BEFORE BREAKFAST (Patient taking differently: Take 75 mcg by mouth daily before breakfast.) 90 tablet 0  ? vitamin B-12 (CYANOCOBALAMIN) 1000 MCG tablet Take 1,000 mcg by mouth in the morning.    ? vitamin C (ASCORBIC ACID) 500 MG tablet Take 1,500 mg by mouth at bedtime.    ? zinc gluconate 50 MG tablet Take 150 mg by mouth in the morning, at noon, and at bedtime.    ? ?No current facility-administered medications for this visit.  ? ? ?Allergies as of 02/20/2022 - Review Complete 02/20/2022  ?Allergen Reaction Noted  ? Contrast media [iodinated contrast media] Hives 11/30/2011  ? ? ?Family History  ?Problem Relation Age of Onset  ? Arthritis Mother   ? Hearing loss Mother   ? Heart  disease Mother   ? Hypertension Mother   ? Arthritis Father   ? Diabetes Father   ? Heart disease Father   ? Breast cancer Neg Hx   ? Colon cancer Neg Hx   ? Esophageal cancer Neg Hx   ? Pancreatic cancer Neg Hx   ? Stomach cancer Neg Hx   ? ? ?Social History  ? ?Socioeconomic History  ? Marital status: Married  ?  Spouse name: [name]  ? Number of children: 2  ? Years of education: Not on file  ? Highest education level: Not on file  ?Occupational History  ? Occupation: Licensed conveyancer  ?Tobacco Use  ? Smoking status: Never  ? Smokeless tobacco: Never  ?Vaping Use  ? Vaping Use: Never used  ?Substance and Sexual Activity  ? Alcohol use: Never  ? Drug use: Never  ? Sexual  activity: Not Currently  ?  Partners: Male  ?Other Topics Concern  ? Not on file  ?Social History Narrative  ? 12/04/20  ? From: the area  ? Living: with husband, [name] [phone])  ? Work: Licensed conveyancer at [employer]  ?   ? Family: 2 children Colletta [place] and Rush Land[place] - 2 grandchildren - nearby  ?   ? Enjoys: read  ?   ? Exercise: trying to get back to exercise  ? Diet: healthy, limits fast foods  ?   ? Safety  ? Seat belts: Yes   ? Guns: Yes  and secure  ? Safe in relationships: Yes   ? ?Social Determinants of Health  ? ?Financial Resource Strain: Not on file  ?Food Insecurity: Not on file  ?Transportation Needs: Not on file  ?Physical Activity: Not on file  ?Stress: Not on file  ?Social Connections: Not on file  ?Intimate Partner Violence: Not on file  ? ? ?Physical Exam: ?General:   Alert, in NAD. No scleral icterus. No bilateral temporal wasting.  ?Heart:  Regular rate and rhythm; no murmurs ?Pulm: Clear anteriorly; no wheezing ?Abdomen:  Soft. Central obesity. Nontender. Nondistended. Normal bowel sounds. No rebound or guarding. No fluid wave.  ?LAD: No inguinal or umbilical LAD ?Extremities:  Without edema. ?Neurologic:  Alert and  oriented x4;  grossly normal neurologically; no asterixis or clonus. ?Skin: No jaundice. Mild palmar erythema. Spider  angioma on the chest wall.  ?Psych:  Alert and cooperative. Normal mood and affect. ? ?I spent over 40 minutes, including in depth chart review, independent review of results, face-to-face time with the patient, coord

## 2022-02-25 ENCOUNTER — Other Ambulatory Visit: Payer: Self-pay

## 2022-02-25 ENCOUNTER — Encounter: Payer: Self-pay | Admitting: Gastroenterology

## 2022-02-25 ENCOUNTER — Telehealth: Payer: Self-pay

## 2022-02-25 DIAGNOSIS — E039 Hypothyroidism, unspecified: Secondary | ICD-10-CM

## 2022-02-25 DIAGNOSIS — K7460 Unspecified cirrhosis of liver: Secondary | ICD-10-CM

## 2022-02-25 MED ORDER — SYNTHROID 75 MCG PO TABS: 75.0000 ug | ORAL_TABLET | Freq: Every day | ORAL | 1 refills | Status: DC

## 2022-02-25 NOTE — Progress Notes (Signed)
? ? ?Chronic Care Management ?Pharmacy Assistant  ? ?Name: [name]  MRN: [id number] DOB: [birth year] ? ?Reason for Encounter: CCM (Hypertension Disease State) ?  ?Recent office visits:  ?02/09/22 Waunita Schooner, MD Cirrhosis of Liver: Abnormal Labs: "kidney function looks like you might be a little dehydrated". No med changes.  ? ?Recent consult visits:  ?02/20/22 Victorino December, MD [facility]): Cirrhosis of Liver: Abnormal Labs "BUN and creatinine have increased from baseline. No ascites on last ultrasound. Diuretics added during her last hospitalization. Hold lasix and aldactone. Repeat BMP 3-4 days for close monitoring." Start: Xifaxan 500 mg BID.  ?02/02/22 [facility]: Acute upper respiratory infection: Patient sent to ED. ?12/23/21 Thornton Park, MD [facility]): Cirrhosis of Liver: Stop (patient preference): Lactulose 20 g. Stop (change in therapy): L-CARNITINE PO, 330 mg. Start: Xifaxam 550 mg BID ? ?Hospital visits:  ?Medication Reconciliation was completed by comparing discharge summary, patient?s EMR and Pharmacy list, and upon discussion with patient. ? ?Admitted to the hospital on 02/02/2022 due to cough and chest congestion. Discharge date was 02/05/2022. Discharged from [facility]   ? ?Final Diagnoses: ?Encephalopathy ?Acute cystitis without hematuria ? ?New?Medications Started at [facility] Discharge:?? ?-started furosemide (LASIX) 20 MG tablet ?-started lactulose (CHRONULAC) 10 GM/15ML solution ?-started spironolactone (ALDACTONE) 50 MG tablet ? ?Medication Changes at Hospital Discharge: ?-Changed losartan (COZAAR) 50 MG tablet ? ?Medications Discontinued at Hospital Discharge: ?-Stopped Biotin 10000 MCG  ? ?Medications that remain the same after Hospital Discharge:??  ?-All other medications will remain the same.   ? ?Medications: ?Outpatient Encounter Medications as of 02/25/2022  ?Medication Sig  ? Cholecalciferol (VITAMIN D3) 50 MCG (2000 UT) TABS Take 2,000 Units by mouth at  bedtime.   ? Elderberry 575 MG/5ML SYRP Take 15 mLs by mouth at bedtime.  ? furosemide (LASIX) 20 MG tablet Take 1 tablet (20 mg total) by mouth daily.  ? lactulose (CHRONULAC) 10 GM/15ML solution Take 45 mLs (30 g total) by mouth 3 (three) times daily.  ? levOCARNitine (CARNITOR) 330 MG tablet Take 330 mg by mouth 3 (three) times daily.  ? losartan (COZAAR) 50 MG tablet Take 1 tablet (50 mg total) by mouth at bedtime.  ? Multiple Vitamin (MULTI-VITAMIN) tablet Take 1 tablet by mouth daily.  ? Ornithine 500 MG CAPS Take 1 capsule by mouth daily.  ? spironolactone (ALDACTONE) 50 MG tablet Take 1 tablet (50 mg total) by mouth daily.  ? SYNTHROID 75 MCG tablet Take 1 tablet (75 mcg total) by mouth daily before breakfast.  ? vitamin B-12 (CYANOCOBALAMIN) 1000 MCG tablet Take 1,000 mcg by mouth in the morning.  ? vitamin C (ASCORBIC ACID) 500 MG tablet Take 1,500 mg by mouth at bedtime.  ? zinc gluconate 50 MG tablet Take 150 mg by mouth in the morning, at noon, and at bedtime.  ? ?No facility-administered encounter medications on file as of 02/25/2022.  ? ?Recent Office Vitals: ?BP Readings from Last 3 Encounters:  ?02/20/22 112/62  ?02/09/22 100/64  ?02/05/22 134/75  ? ?Pulse Readings from Last 3 Encounters:  ?02/20/22 68  ?02/09/22 64  ?02/05/22 (!) 46  ?  ?Wt Readings from Last 3 Encounters:  ?02/20/22 159 lb (72.1 kg)  ?02/09/22 168 lb (76.2 kg)  ?02/02/22 180 lb 8.9 oz (81.9 kg)  ?  ?Kidney Function ?Lab Results  ?Component Value Date/Time  ? CREATININE 1.50 (H) 02/20/2022 04:06 PM  ? CREATININE 1.19 02/09/2022 02:45 PM  ? CREATININE 0.8 01/08/2015 03:47 PM  ? CREATININE 0.9  12/12/2013 03:50 PM  ? GFR 35.03 (L) 02/20/2022 04:06 PM  ? GFRNONAA >60 02/05/2022 07:45 AM  ? GFRAA >60 08/17/2020 01:35 AM  ? ? ?  Latest Ref Rng & Units 02/20/2022  ?  4:06 PM 02/09/2022  ?  2:45 PM 02/05/2022  ?  7:45 AM  ?BMP  ?Glucose 70 - 99 mg/dL 100   69   78    ?BUN 6 - 23 mg/dL 47   29   19    ?Creatinine 0.40 - 1.20 mg/dL 1.50   1.19    0.80    ?Sodium 135 - 145 mEq/L 134   136   138    ?Potassium 3.5 - 5.1 mEq/L 4.8   4.6   3.3    ?Chloride 96 - 112 mEq/L 101   101   106    ?CO2 19 - 32 mEq/L '27   30   26    '$ ?Calcium 8.4 - 10.5 mg/dL 10.1   10.1   8.1    ? ?Contacted patient on 02/26/2022 to discuss hypertension disease state ? ?Current antihypertensive regimen:  ?Losartan 50 mg - 1 tab at bedtime ? Patient verbally confirms she is taking the above medications as directed. Yes ? ?How often are you checking your Blood Pressure? infrequently - Patient has not been taking her blood pressure at home. I have asked patient to take her blood pressure daily starting tomorrow and keep a log. I advised her I would call back on 04/14 for her log. Patient understood and agreed.  ? ?Wrist or arm cuff: Arm cuff ?Caffeine intake: No caffeine at all ?Salt intake: Watches salt intake ?Over the counter medications including pseudoephedrine or NSAIDs? Occasionally takes Tylenol ? ?What recent interventions/DTPs have been made by any provider to improve Blood Pressure control since last CPP Visit:  ?Counseled to monitor BP at home daily, document, and provide log at future appointments ? ?Any recent hospitalizations or ED visits since last visit with CPP? No ? ?What diet changes have been made to improve Blood Pressure Control?  ?Patient watches what she eats.  ? ?What exercise is being done to improve your Blood Pressure Control?  ?Patient walks every day with her husband.  ? ?Adherence Review: ?Is the patient currently on ACE/ARB medication? Yes ?Does the patient have >5 day gap between last estimated fill dates? No ? ?Star Rating Drugs:  ?Medication:  Last Fill: Day Supply ?Losartan 50 mg 02/05/2022 30 ? ?Care Gaps: ?Annual wellness visit in last year? No 01/30/2021 ?Most Recent BP reading: 112/62 on 02/20/2022 ? ?Upcoming appointments: ?PCP appointment on 05/04/2022 ?CCM appointment on 05/20/2022 ? ?Charlene Brooke, CPP notified ? ?Marijean Niemann, RMA ?Clinical  Pharmacy Assistant ?[phone] ? ? ? ? ? ?

## 2022-02-26 ENCOUNTER — Other Ambulatory Visit: Payer: Self-pay

## 2022-02-26 ENCOUNTER — Other Ambulatory Visit (INDEPENDENT_AMBULATORY_CARE_PROVIDER_SITE_OTHER): Payer: Medicare Other

## 2022-02-26 DIAGNOSIS — K766 Portal hypertension: Secondary | ICD-10-CM

## 2022-02-26 DIAGNOSIS — R634 Abnormal weight loss: Secondary | ICD-10-CM | POA: Diagnosis not present

## 2022-02-26 DIAGNOSIS — R748 Abnormal levels of other serum enzymes: Secondary | ICD-10-CM

## 2022-02-26 DIAGNOSIS — K7682 Hepatic encephalopathy: Secondary | ICD-10-CM

## 2022-02-26 DIAGNOSIS — K7460 Unspecified cirrhosis of liver: Secondary | ICD-10-CM | POA: Diagnosis not present

## 2022-02-26 LAB — BASIC METABOLIC PANEL
BUN: 47 mg/dL — ABNORMAL HIGH (ref 6–23)
CO2: 28 mEq/L (ref 19–32)
Calcium: 10 mg/dL (ref 8.4–10.5)
Chloride: 100 mEq/L (ref 96–112)
Creatinine, Ser: 1.35 mg/dL — ABNORMAL HIGH (ref 0.40–1.20)
GFR: 39.75 mL/min — ABNORMAL LOW (ref >60.00)
Glucose, Bld: 88 mg/dL (ref 70–99)
Potassium: 4.9 mEq/L (ref 3.5–5.1)
Sodium: 133 mEq/L — ABNORMAL LOW (ref 135–145)

## 2022-02-26 NOTE — Telephone Encounter (Signed)
Called pt and informed of Dr. Tarri Glenn response. States she is on her way to the lab now to have labs completed. Orders placed for STAT BMP. Will await results and recommendations. ?

## 2022-02-26 NOTE — Telephone Encounter (Signed)
Patient is returning your call. Patient states she stopped taking medication last night. She's asking does she need to wait 3-4 days after she stopped taking medications to get labs done? Please advise ?

## 2022-02-26 NOTE — Telephone Encounter (Signed)
Returned pt call. Advised she should stop Lasix and Spironolactone for 3-4 days BEFORE repeating labs as having them completed now will only alter results if she is still taking meds. Verbalized acceptance and understanding. Pt further added that since taking Lasix and Spironolactone, she has felt extremely nauseous. Felt the nausea was related to meds although nausea has not resolved since stopping meds last night. Advised we will await results of labs, remain well hydrated, eat small frequent meals, limit sodium intake and will call with additional recommendations once results received. ?

## 2022-02-28 ENCOUNTER — Encounter: Payer: Self-pay | Admitting: Gastroenterology

## 2022-03-02 NOTE — Telephone Encounter (Signed)
Dr. Tarri Glenn is out of the office and will not return to the office until 4/17. BMP was obtained 4/6 and will need repeat BMP ~ 03/02/22. While we are not managing her Losartan and given pt concerns while still holding Lasix and Aldactone, routing this message to DOD, Dr. Lyndel Safe for him to review most recent results and to provide further recommendations. Will await his response. ?

## 2022-03-03 ENCOUNTER — Telehealth: Payer: Self-pay

## 2022-03-03 NOTE — Telephone Encounter (Signed)
The BMP was from last Thursday.  It is stable compared to previous.  Recommend repeat BMP in 1 week which can be addressed by Dr. Tarri Glenn.  Thanks

## 2022-03-03 NOTE — Telephone Encounter (Signed)
Patient called states that they received call from somewhere last week to make changes in blood pressure medications and report readings.  ?She has stopped taking following medications on 4/6 ?Lasix and aldactone  ? ?Bp readings  ?02/25/22 99/38 p 71  ? 105/40 p 72 ?02/26/22 120/57 p 67 ? 133/60 p 72 ? ?02/27/22  ?100/48 ?106/51 ?Given bp meds that day  ?03/01/22   ? 133/65 ? 124/55 ?03/02/22 ? 136/65 ? 117/62 ? 125/60 ?03/03/22 ? 128/59 ? ? ?

## 2022-03-04 NOTE — Telephone Encounter (Signed)
BP still well controlled off medication.  ? ?They should get a f/u call from the [facility] nurse to review.  ? ?Please let pt know that she is likely suppose to get labs with GI who advised stopping the medication. Have her call to schedule lab appointment with GI.  ?

## 2022-03-04 NOTE — Telephone Encounter (Signed)
Spoke to pt and relayed message from Dr. Einar Pheasant. Pt is less confused now and says she just has too many dr's and can't keep up. Pt will call GI office.  ?

## 2022-03-05 ENCOUNTER — Telehealth: Payer: Self-pay

## 2022-03-05 NOTE — Progress Notes (Addendum)
? ? ?  Chronic Care Management ?Pharmacy Assistant  ? ?Name: [name]  MRN: [id number] DOB: [birth year] ? ?Reason for Encounter: CCM (Blood Pressure Concerns) ?  ?I spoke with patient's husband Therapist, art) at length today in regards to the concerns him and his wife have with her blood pressure. Patient's husband is concerned that patient being on Losartan 50 mg (1 tablet at bedtime) could have attributed to the patient developing Encephalopathy. He states the patient never had blood pressure issues until the patient broke her leg and was placed on the blood pressure medication. After doing some research on Losartan he feels that is the reason she has developed the disease. Patient was also placed on Lasix 20 mg and Aldactone 50 mg from the hospital in March. Husband stated since being placed on the three medications patient has been feeling nauseous which has suppressed her appetite; she has not been sleeping well and her mental status has been altered. Patient was told by Gastroenterology on April 6 to stop taking Lasix and Aldactone; which patient did. Patient has also stopped taking Losartan on 02/27/2022. Husband stated since stopping all three medications he has noticed his wife is eating better and her mental status is getting better.  ? ?He is very concerned with her blood pressure. He feels her blood pressure medication needs to be evaluated; maybe needs to be changed in dosage or changed to a different medication. Husband would like for Dr. Einar Pheasant to review her readings below and advise patient on how to move forward.  ? ?Patient's husband reported the following readings: ? ?DATE  TIME  BLOOD PRESSURE ? ?04/13  6:30 am 125/63 ? ?04/12  8:00 am   175/93 Pulse 85  (at work - just climbed a flight of stairs before reading) ?  7:00 pm 146/88 ?  11:00 pm 138/65 (bedtime) ? No Losartan given at bedtime ? ?04/11  11:30 am 128/59 ?  12: 00 am 149/73 (bedtime) ? No Losartan given at bedtime ? ?04/10  9:30  am 117/62 ?  11:45 pm 125/60 (bedtime) ? No Losartan given at bedtime ? ?04/09  8:15 am 133/65 ?  1:00 pm 124/55 ?  2: 00 am 136/65 (Bedtime) ? No Losartan given at bedtime ? ?04/08  --------  100/48 ?  11:00 am 106/51 (after a walk to raise BP) ?  12:00 pm 86/46 ?  3:30 pm 100/49 (after a walk to raise BP) ?  11:43 pm 117/55 (bedtime) ? No Losartan given at bedtime ? ?04/07  1:00 am 99/38 (bedtime) No Losartan given ?  11:53 pm 133/60 (bedtime) Losartan 50 mg - 1 tab given at bedtime  ? ?Last dose of Losartan given to date on 02/27/2022. ? ?When I originally spoke with the patient on 02/25/2022 I advised her I would call back on 03/06/2022 for her blood pressure log. That is no longer neccessary as I spoke with her husband today and received it. Husband will make the patient aware I will no longer be calling. ? ?Charlene Brooke, CPP notified ? ?Marijean Niemann, RMA ?Clinical Pharmacy Assistant ?[phone] ? ? ? ?

## 2022-03-05 NOTE — Telephone Encounter (Signed)
Agreed. Thanks for the help. ? ?KLB ?

## 2022-03-06 ENCOUNTER — Other Ambulatory Visit (INDEPENDENT_AMBULATORY_CARE_PROVIDER_SITE_OTHER): Payer: Medicare Other

## 2022-03-06 ENCOUNTER — Encounter: Payer: Self-pay | Admitting: Gastroenterology

## 2022-03-06 DIAGNOSIS — K7460 Unspecified cirrhosis of liver: Secondary | ICD-10-CM

## 2022-03-06 LAB — BASIC METABOLIC PANEL
BUN: 28 mg/dL — ABNORMAL HIGH (ref 6–23)
CO2: 25 mEq/L (ref 19–32)
Calcium: 9.4 mg/dL (ref 8.4–10.5)
Chloride: 101 mEq/L (ref 96–112)
Creatinine, Ser: 0.87 mg/dL (ref 0.40–1.20)
GFR: 67.34 mL/min (ref >60.00)
Glucose, Bld: 85 mg/dL (ref 70–99)
Potassium: 4.5 mEq/L (ref 3.5–5.1)
Sodium: 131 mEq/L — ABNORMAL LOW (ref 135–145)

## 2022-03-09 DIAGNOSIS — I8510 Secondary esophageal varices without bleeding: Secondary | ICD-10-CM | POA: Diagnosis not present

## 2022-03-09 DIAGNOSIS — K7682 Hepatic encephalopathy: Secondary | ICD-10-CM | POA: Diagnosis not present

## 2022-03-09 DIAGNOSIS — K7469 Other cirrhosis of liver: Secondary | ICD-10-CM | POA: Diagnosis not present

## 2022-03-09 NOTE — Telephone Encounter (Signed)
PCP team has spoken with patient and advised of recommendations (see phone encounter 03/03/22). ? ?Per Dr Einar Pheasant: ?"I would probably encourage continued monitoring.  It is somewhat complicated, since she also recently stopped Lasix and spironolactone.  ? ?She probably will need to restart one of the diuretics which it looks like GI is primarily managing because of her liver disease.  I think if they do not want to restart the losartan that is okay but she probably will need something if her blood pressure continues to remain high." ?

## 2022-03-10 ENCOUNTER — Telehealth: Payer: Self-pay

## 2022-03-10 NOTE — Progress Notes (Signed)
? ? ?  Chronic Care Management ?Pharmacy Assistant  ? ?Name: Merci Walthers  MRN: [id number] DOB: 09/18/1951 ? ?Reason for Encounter: CCM (Schedule Appointment) ?  ?Called patient to schedule a telephone appointment with Charlene Brooke for blood pressure medication review. Patient has been scheduled for tomorrow 03/11/2022 at 3:45 pm. ? ?Charlene Brooke, CPP notified ? ?Marijean Niemann, RMA ?Clinical Pharmacy Assistant ?[phone] ? ? ?

## 2022-03-11 ENCOUNTER — Ambulatory Visit (INDEPENDENT_AMBULATORY_CARE_PROVIDER_SITE_OTHER): Payer: Medicare Other | Admitting: Pharmacist

## 2022-03-11 DIAGNOSIS — K7460 Unspecified cirrhosis of liver: Secondary | ICD-10-CM

## 2022-03-11 DIAGNOSIS — E039 Hypothyroidism, unspecified: Secondary | ICD-10-CM

## 2022-03-11 DIAGNOSIS — I10 Essential (primary) hypertension: Secondary | ICD-10-CM

## 2022-03-11 DIAGNOSIS — M8589 Other specified disorders of bone density and structure, multiple sites: Secondary | ICD-10-CM

## 2022-03-11 NOTE — Progress Notes (Signed)
? ?Chronic Care Management ?Pharmacy Note ? ?03/20/2022 ?Name:  [name] MRN:  [id number] DOB:  Aug 27, 1951 ? ?Summary: CCM F/U visit ?-Pt stopped taking losartan almost 2 weeks ago due to hypotension and BP has been generally < 140/90 at home since. GI recently stopped spironolactone and Lasix due to declining kidney function as well. ?-Pt reports improvement in encephalopathy symptoms since increasing protein in diet ? ?Recommendations/Changes made from today's visit: ?-Discontinued losartan from med list. Advised continued daily monitoring of BP. Advised to call if SBP consistently > 140. ?-Advised to follow up with GI as scheduled ? ?Plan: ?-[facility] will call patient in 1 month for BP log ?-Pharmacist follow up televisit scheduled for 6 months ?-PCP F/U 05/04/22 ? ? ?Subjective: ?[name] is an 71 y.o. year old female who is a primary patient of Katherine, Jobe Marker, MD.  The CCM team was consulted for assistance with disease management and care coordination needs.   ? ?Engaged with patient by telephone for follow up visit in response to provider referral for pharmacy case management and/or care coordination services.  ? ?Consent to Services:  ?The patient was given information about Chronic Care Management services, agreed to services, and gave verbal consent prior to initiation of services.  Please see initial visit note for detailed documentation.  ? ?Patient Care Team: ?Lesleigh Noe, MD as PCP - General (Family Medicine) ?Thornton Park, MD as Consulting Physician (Gastroenterology) ?Drazek, Dawn, CRNP as Designer, jewellery (Nurse Practitioner) ?Conway Cohick, Cleaster Corin, [facility] as Pharmacist (Pharmacist) ? ?Recent office visits: ?02/09/22 Dr Einar Pheasant OV: hospital f/u - kidney function down, likely dehydration. No med changes. ?] ?01/30/2021 - Katherine Schooner, MD - Patient presented for Annual Wellness Visit. Stop due to medication cost: XIFAXAN 550 MG TABS tablet.  ? ?Recent consult visits: ?03/06/22  repeat BMP - kidney fxn improved. Stay off diuretics. ?02/20/22 Dr Tarri Glenn (GI): hospital f/u (cirrhosis, encephalopathy): Continue Lactulose 45 ml TID. Provided PAP for Xifaxan. Cr/BUN increased, stop Lasix and Spironolactone. ? ?12/23/21 Dr Tarri Glenn (GI): colonoscopy recommended ? ?11/10/2021 - Katherine Locks, NP - [facility] - Patient presented for other cirrhosis of liver, hepatic encephalopathy and secondary esophageal varices without bleeding and weight loss. Procedure: US Abdomen Limited. Pt is poor candidate for transplant d/t hx of colon cancer and age. Rec'd labs, MELD evaluation and colonoscopy.  ? ?07/23/2021 - [name] - Dermatology - Patient presented for history of other malignant neoplasm of skin, seborrheic keratosis. Procedure: PR Destruc Premal, first lesion. No other information ? ?Hospital visits: ?Medication Reconciliation was completed by comparing discharge summary, patient?s EMR and Pharmacy list, and upon discussion with patient. ? ?Admitted to the hospital on 02/02/22 due to Acute encephalopathy. Discharge date was 02/05/22. Discharged from [facility].   ? ?New?Medications Started at [facility] Discharge:?? ?-started Lasix 20 mg daily ?-Spironolactone 50 mg daily ?-Lactulose 30 g TID (goal 2-3 BM daily) ? ?Medication Changes at Hospital Discharge: ?-Reduced losartan to 50 mg daily (due to Lasix, Aldactone addition) ? ?Medications that remain the same after Hospital Discharge:??  ?-All other medications will remain the same.   ? ? ?Objective: ? ?Lab Results  ?Component Value Date  ? CREATININE 0.87 03/06/2022  ? BUN 28 (H) 03/06/2022  ? GFR 67.34 03/06/2022  ? GFRNONAA >60 02/05/2022  ? GFRAA >60 08/17/2020  ? NA 131 (L) 03/06/2022  ? K 4.5 03/06/2022  ? CALCIUM 9.4 03/06/2022  ? CO2 25 03/06/2022  ? GLUCOSE 85  03/06/2022  ? ? ?Lab Results  ?Component Value Date/Time  ? HGBA1C 5.1 08/14/2020 07:45 AM  ? GFR 67.34 03/06/2022 12:36 PM  ? GFR 39.75 (L) 02/26/2022  01:33 PM  ?  ?Last diabetic Eye exam: No results found for: HMDIABEYEEXA  ?Last diabetic Foot exam: No results found for: HMDIABFOOTEX  ? ?Lab Results  ?Component Value Date  ? CHOL 159 09/14/2018  ? HDL 73.60 09/14/2018  ? [place] 77 09/14/2018  ? TRIG 83 08/12/2020  ? CHOLHDL 2 09/14/2018  ? ? ? ?  Latest Ref Rng & Units 02/09/2022  ?  2:45 PM 02/05/2022  ?  7:45 AM 02/04/2022  ?  5:23 AM  ?Hepatic Function  ?Total Protein 6.0 - 8.3 g/dL 7.0   6.3   6.6    ?Albumin 3.5 - 5.2 g/dL 3.1   2.5   2.7    ?AST 0 - 37 U/L 122   110   102    ?ALT 0 - 35 U/L 87   77   73    ?Alk Phosphatase 39 - 117 U/L 320   274   286    ?Total Bilirubin 0.2 - 1.2 mg/dL 1.0   0.9   0.7    ? ? ?Lab Results  ?Component Value Date/Time  ? TSH 0.99 01/15/2021 03:31 PM  ? TSH 0.574 11/28/2020 10:30 AM  ? TSH 7.826 (H) 08/13/2020 04:45 AM  ? TSH 4.95 (H) 07/31/2020 03:33 PM  ? FREET4 0.88 01/15/2021 03:31 PM  ? FREET4 0.79 08/01/2020 04:36 PM  ? ? ? ?  Latest Ref Rng & Units 02/09/2022  ?  2:45 PM 02/05/2022  ?  7:45 AM 02/04/2022  ?  5:23 AM  ?CBC  ?WBC 4.0 - 10.5 K/uL 5.7   3.9   4.3    ?Hemoglobin 12.0 - 15.0 g/dL 12.8   11.7   11.3    ?Hematocrit 36.0 - 46.0 % 38.2   35.0   34.4    ?Platelets 150.0 - 400.0 K/uL 221.0   122   97    ? ? ?Lab Results  ?Component Value Date/Time  ? VD25OH 53.64 09/14/2018 04:35 PM  ? ? ?Clinical ASCVD: No  ?The ASCVD Risk score (Arnett DK, et al., 2019) failed to calculate for the following reasons: ?  Cannot find a previous HDL lab ?  Cannot find a previous total cholesterol lab   ? ? ?  02/09/2022  ?  2:07 PM 01/30/2021  ?  3:42 PM 01/30/2021  ?  3:04 PM  ?Depression screen PHQ 2/9  ?Decreased Interest 0 0 0  ?Down, Depressed, Hopeless 0 0 0  ?PHQ - 2 Score 0 0 0  ?  ? ?Social History  ? ?Tobacco Use  ?Smoking Status Never  ?Smokeless Tobacco Never  ? ?BP Readings from Last 3 Encounters:  ?02/20/22 112/62  ?02/09/22 100/64  ?02/05/22 134/75  ? ?Pulse Readings from Last 3 Encounters:  ?02/20/22 68  ?02/09/22 64   ?02/05/22 (!) 46  ? ?Wt Readings from Last 3 Encounters:  ?02/20/22 159 lb (72.1 kg)  ?02/09/22 168 lb (76.2 kg)  ?02/02/22 180 lb 8.9 oz (81.9 kg)  ? ?BMI Readings from Last 3 Encounters:  ?02/20/22 26.46 kg/m?  ?02/09/22 27.96 kg/m?  ?02/02/22 30.05 kg/m?  ? ? ?Assessment/Interventions: Review of patient past medical history, allergies, medications, health status, including review of consultants reports, laboratory and other test data, was performed as part of comprehensive evaluation and provision of chronic  care management services.  ? ?SDOH:  (Social Determinants of Health) assessments and interventions performed: Yes ? ?SDOH Screenings  ? ?Alcohol Screen: Not on file  ?Depression (PHQ2-9): Low Risk   ? PHQ-2 Score: 0  ?Financial Resource Strain: Not on file  ?Food Insecurity: Not on file  ?Housing: Not on file  ?Physical Activity: Not on file  ?Social Connections: Not on file  ?Stress: Not on file  ?Tobacco Use: Low Risk   ? Smoking Tobacco Use: Never  ? Smokeless Tobacco Use: Never  ? Passive Exposure: Not on file  ?Transportation Needs: Not on file  ? ? ?[place] ? ?Allergies  ?Allergen Reactions  ? Contrast Media [Iodinated Contrast Media] Hives  ? ? ?Medications Reviewed Today   ? ? Reviewed by Charlton Haws, [facility] (Pharmacist) on 03/11/22 at 1609  Med List Status: <None>  ? ?Medication Order Taking? Sig Documenting Provider Last Dose Status Informant  ?Cholecalciferol (VITAMIN D3) 50 MCG (2000 UT) TABS [id number] Yes Take 2,000 Units by mouth at bedtime.  [provider] Taking Active Spouse/Significant Other  ?Elderberry 575 MG/5ML SYRP [id number] Yes Take 15 mLs by mouth at bedtime. [provider] Taking Active Spouse/Significant Other  ?         ?Med Note Leanord Asal, TREVINA M   Thu Nov 28, 2020 10:15 AM)    ?lactulose (CHRONULAC) 10 GM/15ML solution [id number] Yes Take 45 mLs (30 g total) by mouth 3 (three) times daily. Thornton Park, MD Taking Active   ?levOCARNitine  (CARNITOR) 330 MG tablet [id number] Yes Take 330 mg by mouth 3 (three) times daily. [provider] Taking Active Spouse/Significant Other  ?losartan (COZAAR) 50 MG tablet [id number] Yes Take 1 tablet (50 mg tot

## 2022-03-20 NOTE — Patient Instructions (Addendum)
Visit Information ? ?Phone number for Pharmacist: [phone] ? ? Goals Addressed   ?None ?  ? ? ?Care Plan : [place]  ?Updates made by Charlton Haws, [place] since 03/20/2022 12:00 AM  ?  ? ?Problem: Hypertension, Hypothyroidism, and Osteopenia, Cirrhosis of liver   ?Priority: High  ?  ? ?Long-Range Goal: Disease mgmt   ?Start Date: 11/25/2021  ?Expected End Date: 11/25/2022  ?This Visit's Progress: On track  ?Recent Progress: On track  ?Priority: High  ?Note:   ?Current Barriers:  ?Unable to independently monitor therapeutic efficacy ? ?Pharmacist Clinical Goal(s):  ?Patient will achieve adherence to monitoring guidelines and medication adherence to achieve therapeutic efficacy through collaboration with PharmD and provider.  ? ?Interventions: ?1:1 collaboration with Lesleigh Noe, MD regarding development and update of comprehensive plan of care as evidenced by provider attestation and co-signature ?Inter-disciplinary care team collaboration (see longitudinal plan of care) ?Comprehensive medication review performed; medication list updated in electronic medical record ? ?Hypertension (BP goal <130/80) ?-Controlled - pt stopped taking losartan 50 mg 02/27/22 due to hypotension (99/38); BP has been generally at goal at home since stopping losartan ?-Current home readings: checking 2-3 time daily over past week ?125/63, 146/88, 138/65, 128/59, 149/73, 117/62, 125/60, 133/65, 124/55 ?-Current treatment: ?Losartan 100 mg daily HS - not taking ?-Current dietary habits: water only - 8 oz AM, 2 bottles of 16 oz during day,  ?-Current exercise habits: walking daily w/ husband ?-Educated on BP goals and benefits of medications for prevention of heart attack, stroke and kidney damage;Daily salt intake goal < 2300 mg; Importance of home blood pressure monitoring; Symptoms of hypotension and importance of maintaining adequate hydration; ?-Counseled to monitor BP at home daily, ?-Advised to remain off losartan  and continue monitoring BP; contact PCP if BP consistently > 140/90 ? 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 MELD 11. CPT B ?-hepatic encephalopathy s/p covid-19 infection 07/2020, hospitalized for same Jan 2022 ?-Current treatment  ?Lactulose 10 gm/15 mL - 45 ml TID goal 3-4 BM daily - Appropriate, Effective, Safe, Accessible ?Zinc 50 mg TID ?L-carnitine 330 mg TID - Appropriate, Effective, Safe, Accessible ?L-ornithine daily PM - not taking ?-Medications previously tried: Nurse, mental health (cost prohibitive - may consider [place] in future)  ?-Recommended to continue current medication ? ?Hypothyroidism (Goal: maintain TSH in goal range) ?-Controlled - pt takes 1 hour before other meds ?-Current treatment  ?Synthroid 75 mcg daily AM - Appropriate, Effective, Safe, Accessible ?-Recommended to continue current medication ? ?Osteopenia (Goal prevent fractures) ?-Controlled - pt walks daily ?-Last DEXA Scan: 07/10/2019 Tor Netters, NP) ? T-Score femoral neck: -1.3 ? T-Score total hip: -1.3 ? T-Score lumbar spine: +0.1 ? 10-year probability of major osteoporotic fracture: 7.9% ? 10-year probability of hip fracture: 0.8% ?-Patient is not a candidate for pharmacologic treatment ?-Current treatment  ?Vitamin D 2000 IU daily ?-Recommend weight-bearing and muscle strengthening exercises for building and maintaining bone density. ?-Recommend repeat DEXA scan in the next year ? ?Health Maintenance ?-Vaccine gaps: Prevnar, Flu, Shingrix, Covid, TDAP. Pt declines vaccines. ? ?Patient Goals/Self-Care Activities ?Patient will:  ?- take medications as prescribed as evidenced by patient report and record review ?focus on medication adherence by routine ?check  blood pressure daily, document, and provide at future appointments ?  engage in dietary modifications by consuming 90g of protein daily ?  ?  ? ?Patient verbalizes understanding of instructions and care plan provided today and agrees to view in [place]. Active MyChart status confirmed with patient.   ?Telephone follow up appointment with pharmacy team member scheduled for: 6 months ? ?Charlene Brooke, PharmD, BCACP ?Clinical Pharmacist ?[facility] ?[phone] ?  ?

## 2022-03-22 DIAGNOSIS — I10 Essential (primary) hypertension: Secondary | ICD-10-CM | POA: Diagnosis not present

## 2022-03-22 DIAGNOSIS — M8580 Other specified disorders of bone density and structure, unspecified site: Secondary | ICD-10-CM

## 2022-03-22 DIAGNOSIS — E039 Hypothyroidism, unspecified: Secondary | ICD-10-CM

## 2022-03-24 ENCOUNTER — Other Ambulatory Visit: Payer: Self-pay

## 2022-03-24 DIAGNOSIS — R634 Abnormal weight loss: Secondary | ICD-10-CM

## 2022-03-24 DIAGNOSIS — K7460 Unspecified cirrhosis of liver: Secondary | ICD-10-CM

## 2022-03-24 MED ORDER — SUTAB 1479-225-188 MG PO TABS: 1.0000 | ORAL_TABLET | ORAL | 0 refills | Status: DC

## 2022-03-30 ENCOUNTER — Telehealth: Payer: Self-pay

## 2022-03-30 NOTE — Progress Notes (Addendum)
Chronic Care Management Pharmacy Assistant   Name: Camrin Lapre  MRN: [id number] DOB: 1951-01-20  Reason for Encounter: CCM (Hypertension Disease State)   Recent office visits:  None since last CCM contact  Recent consult visits:  None since last CCM contact  Hospital visits:  None since last CCM contact  Medications: Outpatient Encounter Medications as of 03/30/2022  Medication Sig   Cholecalciferol (VITAMIN D3) 50 MCG (2000 UT) TABS Take 2,000 Units by mouth at bedtime.    Elderberry 575 MG/5ML SYRP Take 15 mLs by mouth at bedtime.   lactulose (CHRONULAC) 10 GM/15ML solution Take 45 mLs (30 g total) by mouth 3 (three) times daily.   levOCARNitine (CARNITOR) 330 MG tablet Take 330 mg by mouth 3 (three) times daily.   Multiple Vitamin (MULTI-VITAMIN) tablet Take 1 tablet by mouth daily.   Ornithine 500 MG CAPS Take 1 capsule by mouth daily.   Sodium Sulfate-Mag Sulfate-KCl (SUTAB) [phone] MG TABS Take 1 kit by mouth as directed. MANUFACTURER CODES!! BIN: [id number] PCN: CN GROUP: [id number] MEMBER ID: [id number];XTA AS CASH;NO PRIOR AUTHORIZATION   SYNTHROID 75 MCG tablet Take 1 tablet (75 mcg total) by mouth daily before breakfast.   vitamin B-12 (CYANOCOBALAMIN) 1000 MCG tablet Take 1,000 mcg by mouth in the morning.   vitamin C (ASCORBIC ACID) 500 MG tablet Take 1,500 mg by mouth at bedtime.   zinc gluconate 50 MG tablet Take 150 mg by mouth in the morning, at noon, and at bedtime.   No facility-administered encounter medications on file as of 03/30/2022.   Recent Office Vitals: BP Readings from Last 3 Encounters:  02/20/22 112/62  02/09/22 100/64  02/05/22 134/75   Pulse Readings from Last 3 Encounters:  02/20/22 68  02/09/22 64  02/05/22 (!) 46    Wt Readings from Last 3 Encounters:  02/20/22 159 lb (72.1 kg)  02/09/22 168 lb (76.2 kg)  02/02/22 180 lb 8.9 oz (81.9 kg)    Kidney Function Lab Results  Component Value Date/Time   CREATININE 0.87 03/06/2022  12:36 PM   CREATININE 1.35 (H) 02/26/2022 01:33 PM   CREATININE 0.8 01/08/2015 03:47 PM   CREATININE 0.9 12/12/2013 03:50 PM   GFR 67.34 03/06/2022 12:36 PM   GFRNONAA >60 02/05/2022 07:45 AM   GFRAA >60 08/17/2020 01:35 AM      Latest Ref Rng & Units 03/06/2022   12:36 PM 02/26/2022    1:33 PM 02/20/2022    4:06 PM  BMP  Glucose 70 - 99 mg/dL 85   88   100    BUN 6 - 23 mg/dL 28   47   47    Creatinine 0.40 - 1.20 mg/dL 0.87   1.35   1.50    Sodium 135 - 145 mEq/L 131   133   134    Potassium 3.5 - 5.1 mEq/L 4.5   4.9   4.8    Chloride 96 - 112 mEq/L 101   100   101    CO2 19 - 32 mEq/L '25   28   27    ' Calcium 8.4 - 10.5 mg/dL 9.4   10.0   10.1     Contacted patient on 03/31/2022 to discuss hypertension disease state  Current antihypertensive regimen:  Losartan 100 mg daily HS - not taking  Patient verbally confirms she is taking the above medications as directed. Yes  How often are you checking your Blood Pressure? daily  she checks her blood pressure in  the morning before taking her medication. Patient did not have any readings with her as she was not at home. Patient will contact me with readings.   Wrist or arm cuff: Arm cuff Caffeine intake: No caffeine at all Salt intake: Watches salt intake Over the counter medications including pseudoephedrine or NSAIDs? Occasionally takes Tylenol  Any readings above 180/120? Yes  What recent interventions/DTPs have been made by any provider to improve Blood Pressure control since last CPP Visit: Per Charlene Brooke:  Advised to remain off losartan and continue monitoring BP; contact PCP if BP consistently > 140/90  Any recent hospitalizations or ED visits since last visit with CPP? No  What diet changes have been made to improve Blood Pressure Control?  Patient is mindful of what she is eating; she watches her salt intake as well.  What exercise is being done to improve your Blood Pressure Control?  Patient walks with her  husband daily.   Adherence Review: Is the patient currently on ACE/ARB medication? Yes Does the patient have >5 day gap between last estimated fill dates? No   Star Rating Drugs:  Medication:                Last Fill:         Day Supply Losartan 50 mg           02/05/2022      30   Care Gaps: Annual wellness visit in last year? No 01/30/2021 Most Recent BP reading: 112/62 on 02/20/2022   Upcoming appointments: PCP appointment on 05/04/2022 CCM appointment on 05/20/2022   Charlene Brooke, CPP notified   Marijean Niemann, [place] Assistant [phone]

## 2022-04-01 ENCOUNTER — Ambulatory Visit (INDEPENDENT_AMBULATORY_CARE_PROVIDER_SITE_OTHER): Payer: Medicare Other

## 2022-04-01 VITALS — Wt 159.0 lb

## 2022-04-01 DIAGNOSIS — Z0000 Encounter for general adult medical examination without abnormal findings: Secondary | ICD-10-CM

## 2022-04-01 NOTE — Progress Notes (Signed)
?Virtual Visit via Telephone Note ? ?I connected with  [name] on 04/01/22 at  3:00 PM EDT by telephone and verified that I am speaking with the correct person using two identifiers. ? ?Location: ?Patient: home ?Provider: [place] ?Persons participating in the virtual visit: patient/Nurse Health Advisor ?  ?I discussed the limitations, risks, security and privacy concerns of performing an evaluation and management service by telephone and the availability of in person appointments. The patient expressed understanding and agreed to proceed. ? ?Interactive audio and video telecommunications were attempted between this nurse and patient, however failed, due to patient having technical difficulties OR patient did not have access to video capability.  We continued and completed visit with audio only. ? ?Some vital signs may be absent or patient reported.  ? ?Dionisio David, LPN ? ?Subjective:  ? [name] is a 71 y.o. female who presents for Medicare Annual (Subsequent) preventive examination. ? ?Review of Systems    ? ?  ? ?   ?Objective:  ?  ?Today's Vitals  ? 04/01/22 1503  ?PainSc: 0-No pain  ? ?There is no height or weight on file to calculate BMI. ? ? ?  02/02/2022  ?  4:38 PM 01/30/2021  ?  3:00 PM 11/27/2020  ?  8:43 PM 08/14/2020  ? 12:00 AM 08/12/2020  ? 11:16 PM 03/12/2020  ? 11:49 AM 01/24/2020  ?  3:33 PM  ?Advanced Directives  ?Does Patient Have a Medical Advance Directive? _0  No No  ?Would patient like information on creating a medical advance directive? No - Patient declined Yes (MAU/Ambulatory/Procedural Areas - Information given)  No - Patient declined No - Patient declined No - Patient declined No - Patient declined  ? ? ?Current Medications (verified) ?Outpatient Encounter Medications as of 04/01/2022  ?Medication Sig  ? furosemide (LASIX) 20 MG tablet Take by mouth.  ? spironolactone (ALDACTONE) 50 MG tablet Take 1 tablet by mouth daily.  ? Cholecalciferol (VITAMIN D3) 50 MCG  (2000 UT) TABS Take 2,000 Units by mouth at bedtime.   ? Elderberry 575 MG/5ML SYRP Take 15 mLs by mouth at bedtime.  ? ENULOSE 10 GM/15ML SOLN Take by mouth.  ? lactulose (CHRONULAC) 10 GM/15ML solution Take 45 mLs (30 g total) by mouth 3 (three) times daily.  ? levOCARNitine (CARNITOR) 330 MG tablet Take 330 mg by mouth 3 (three) times daily.  ? Multiple Vitamin (MULTI-VITAMIN) tablet Take 1 tablet by mouth daily.  ? Ornithine 500 MG CAPS Take 1 capsule by mouth daily.  ? Sodium Sulfate-Mag Sulfate-KCl (SUTAB) [phone] MG TABS Take 1 kit by mouth as directed. MANUFACTURER CODES!! BIN: [id number] PCN: CN GROUP: [id number] MEMBER ID: [id number];ZES AS CASH;NO PRIOR AUTHORIZATION  ? SYNTHROID 75 MCG tablet Take 1 tablet (75 mcg total) by mouth daily before breakfast.  ? vitamin B-12 (CYANOCOBALAMIN) 1000 MCG tablet Take 1,000 mcg by mouth in the morning.  ? vitamin C (ASCORBIC ACID) 500 MG tablet Take 1,500 mg by mouth at bedtime.  ? zinc gluconate 50 MG tablet Take 150 mg by mouth in the morning, at noon, and at bedtime.  ? ?No facility-administered encounter medications on file as of 04/01/2022.  ? ? ?Allergies (verified) ?Contrast media [iodinated contrast media]  ? ?History: ?Past Medical History:  ?Diagnosis Date  ? Cancer [facility])   ? cecum  ? Elevated liver function tests   ? Esophageal varices (HCC)   ? Hemorrhage of gastrointestinal tract 05/04/2011  ?  Hypothyroidism   ? Iron deficiency anemia   ? Liver disease   ? chemotherapy complication, per pt, shunts placed to bypass liver  ? Malignant neoplasm of cecum ([place])   ? Portal hypertension (HCC)   ? Splenomegaly   ? ?Past Surgical History:  ?Procedure Laterality Date  ? ESOPHAGEAL VARICE LIGATION    ? HEMICOLECTOMY  01/08/03  ? IR RADIOLOGIST EVAL & MGMT  12/20/2020  ? IR RADIOLOGIST EVAL & MGMT  05/29/2021  ? LIVER SURGERY    ? shunts placed after chemo complication  ? SKIN FULL THICKNESS GRAFT N/A 09/12/2019  ? Procedure: debridement and FTSG to the nose from left  upper arm;  Surgeon: Cindra Presume, MD;  Location: [facility];  Service: Plastics;  Laterality: N/A;  2 hours, please  ? TIPS PROCEDURE    ? ?Family History  ?Problem Relation Age of Onset  ? Arthritis Mother   ? Hearing loss Mother   ? Heart disease Mother   ? Hypertension Mother   ? Arthritis Father   ? Diabetes Father   ? Heart disease Father   ? Breast cancer Neg Hx   ? Colon cancer Neg Hx   ? Esophageal cancer Neg Hx   ? Pancreatic cancer Neg Hx   ? Stomach cancer Neg Hx   ? ?Social History  ? ?Socioeconomic History  ? Marital status: Married  ?  Spouse name: Ozzie Hoyle  ? Number of children: 2  ? Years of education: Not on file  ? Highest education level: Not on file  ?Occupational History  ? Occupation: Licensed conveyancer  ?Tobacco Use  ? Smoking status: Never  ? Smokeless tobacco: Never  ?Vaping Use  ? Vaping Use: Never used  ?Substance and Sexual Activity  ? Alcohol use: Never  ? Drug use: Never  ? Sexual activity: Not Currently  ?  Partners: Male  ?Other Topics Concern  ? Not on file  ?Social History Narrative  ? 12/04/20  ? From: the area  ? Living: with husband, Ozzie Hoyle [phone])  ? Work: Licensed conveyancer at [employer]  ?   ? Family: 2 children Colletta [place] and [place] - 2 grandchildren - nearby  ?   ? Enjoys: read  ?   ? Exercise: trying to get back to exercise  ? Diet: healthy, limits fast foods  ?   ? Safety  ? Seat belts: Yes   ? Guns: Yes  and secure  ? Safe in relationships: Yes   ? ?Social Determinants of Health  ? ?Financial Resource Strain: Not on file  ?Food Insecurity: Not on file  ?Transportation Needs: Not on file  ?Physical Activity: Not on file  ?Stress: Not on file  ?Social Connections: Not on file  ? ? ?Tobacco Counseling ?Counseling given: Not Answered ? ? ?Clinical Intake: ? ?Pre-visit preparation completed: Yes ? ?Pain : No/denies pain ?Pain Score: 0-No pain ? ?  ? ?Nutritional Risks: None ?Diabetes: No ? ?How often do you need to have someone help you when you read instructions,  pamphlets, or other written materials from your doctor or pharmacy?: 1 - Never ? ?Diabetic?no ? ?Interpreter Needed?: No ? ?Information entered by :: Kirke Shaggy, LPN ? ? ?Activities of Daily Living ? ?  02/03/2022  ?  1:00 AM 02/02/2022  ? 11:00 PM  ?In your present state of health, do you have any difficulty performing the following activities:  ?Hearing? 0 0  ?Vision? 0 0  ?Difficulty concentrating or making decisions? 1  0  ?Walking or climbing stairs? 0   ?Dressing or bathing? 0   ?Doing errands, shopping? 1   ? ? ?Patient Care Team: ?Lesleigh Noe, MD as PCP - General (Family Medicine) ?Thornton Park, MD as Consulting Physician (Gastroenterology) ?Drazek, Dawn, CRNP as Designer, jewellery (Nurse Practitioner) ?Foltanski, Cleaster Corin, [facility] as Pharmacist (Pharmacist) ? ?Indicate any recent Medical Services you may have received from other than [facility] providers in the past year (date may be approximate). ? ?   ?Assessment:  ? This is a routine wellness examination for [facility]. ? ?Hearing/Vision screen ?No results found. ? ?Dietary issues and exercise activities discussed: ?  ? ? Goals Addressed   ?None ?  ? ?Depression Screen ? ?  02/09/2022  ?  2:07 PM 01/30/2021  ?  3:42 PM 01/30/2021  ?  3:04 PM 01/24/2020  ?  3:37 PM 01/18/2019  ?  3:36 PM 09/14/2018  ?  3:56 PM  ?PHQ 2/9 Scores  ?PHQ - 2 Score 0 0 0 0 0 0  ?PHQ- 9 Score    0    ?  ?Fall Risk ? ?  02/09/2022  ?  2:07 PM 01/30/2021  ?  3:01 PM 01/24/2020  ?  3:35 PM 01/18/2019  ?  3:36 PM 09/14/2018  ?  3:56 PM  ?Fall Risk   ?Falls in the past year? 0 1 0 0 No  ?Number falls in past yr: 0 0 0    ?Injury with Fall? 0 1 0    ?Risk for fall due to : No Fall Risks History of fall(s);Impaired balance/gait No Fall Risks    ?Follow up Falls evaluation completed Falls evaluation completed Falls evaluation completed;Falls prevention discussed    ? ? ?FALL RISK PREVENTION PERTAINING TO THE HOME: ? ?Any stairs in or around the home? No  ?If so, are there any without handrails? No   ?Home free of loose throw rugs in walkways, pet beds, electrical cords, etc? Yes  ?Adequate lighting in your home to reduce risk of falls? Yes  ? ?ASSISTIVE DEVICES UTILIZED TO PREVENT FALLS: ? ?Life alert? N

## 2022-04-01 NOTE — Patient Instructions (Signed)
[name] , ?Thank you for taking time to come for your Medicare Wellness Visit. I appreciate your ongoing commitment to your health goals. Please review the following plan we discussed and let me know if I can assist you in the future.  ? ?Screening recommendations/referrals: ?Colonoscopy: scheduled for 05/09/23 ?Mammogram: 09/10/21 ?Bone Density: 07/10/19 ?Recommended yearly ophthalmology/optometry visit for glaucoma screening and checkup ?Recommended yearly dental visit for hygiene and checkup ? ?Vaccinations: ?Influenza vaccine: n/d ?Pneumococcal vaccine: n/d ?Tdap vaccine: n/d ?Shingles vaccine: n/d   ?Covid-19:n/d ? ?Advanced directives: no ? ?Conditions/risks identified: none ? ?Next appointment: Follow up in one year for your annual wellness visit 04/05/23 @ 1:45pm by phone ? ? ?Preventive Care 6 Years and Older, Female ?Preventive care refers to lifestyle choices and visits with your health care provider that can promote health and wellness. ?What does preventive care include? ?A yearly physical exam. This is also called an annual well check. ?Dental exams once or twice a year. ?Routine eye exams. Ask your health care provider how often you should have your eyes checked. ?Personal lifestyle choices, including: ?Daily care of your teeth and gums. ?Regular physical activity. ?Eating a healthy diet. ?Avoiding tobacco and drug use. ?Limiting alcohol use. ?Practicing safe sex. ?Taking low-dose aspirin every day. ?Taking vitamin and mineral supplements as recommended by your health care provider. ?What happens during an annual well check? ?The services and screenings done by your health care provider during your annual well check will depend on your age, overall health, lifestyle risk factors, and family history of disease. ?Counseling  ?Your health care provider may ask you questions about your: ?Alcohol use. ?Tobacco use. ?Drug use. ?Emotional well-being. ?Home and relationship well-being. ?Sexual  activity. ?Eating habits. ?History of falls. ?Memory and ability to understand (cognition). ?Work and work Statistician. ?Reproductive health. ?Screening  ?You may have the following tests or measurements: ?Height, weight, and BMI. ?Blood pressure. ?Lipid and cholesterol levels. These may be checked every 5 years, or more frequently if you are over 49 years old. ?Skin check. ?Lung cancer screening. You may have this screening every year starting at age 52 if you have a 30-pack-year history of smoking and currently smoke or have quit within the past 15 years. ?Fecal occult blood test (FOBT) of the stool. You may have this test every year starting at age 53. ?Flexible sigmoidoscopy or colonoscopy. You may have a sigmoidoscopy every 5 years or a colonoscopy every 10 years starting at age 3. ?Hepatitis C blood test. ?Hepatitis B blood test. ?Sexually transmitted disease (STD) testing. ?Diabetes screening. This is done by checking your blood sugar (glucose) after you have not eaten for a while (fasting). You may have this done every 1-3 years. ?Bone density scan. This is done to screen for osteoporosis. You may have this done starting at age 47. ?Mammogram. This may be done every 1-2 years. Talk to your health care provider about how often you should have regular mammograms. ?Talk with your health care provider about your test results, treatment options, and if necessary, the need for more tests. ?Vaccines  ?Your health care provider may recommend certain vaccines, such as: ?Influenza vaccine. This is recommended every year. ?Tetanus, diphtheria, and acellular pertussis (Tdap, Td) vaccine. You may need a Td booster every 10 years. ?Zoster vaccine. You may need this after age 18. ?Pneumococcal 13-valent conjugate (PCV13) vaccine. One dose is recommended after age 66. ?Pneumococcal polysaccharide (PPSV23) vaccine. One dose is recommended after age 62. ?Talk to your health care provider  about which screenings and vaccines  you need and how often you need them. ?This information is not intended to replace advice given to you by your health care provider. Make sure you discuss any questions you have with your health care provider. ?Document Released: 12/06/2015 Document Revised: 07/29/2016 Document Reviewed: 09/10/2015 ?Elsevier Interactive Patient Education ? 2017 [employer]. ? ?Fall Prevention in the Home ?Falls can cause injuries. They can happen to people of all ages. There are many things you can do to make your home safe and to help prevent falls. ?What can I do on the outside of my home? ?Regularly fix the edges of walkways and driveways and fix any cracks. ?Remove anything that might make you trip as you walk through a door, such as a raised step or threshold. ?Trim any bushes or trees on the path to your home. ?Use bright outdoor lighting. ?Clear any walking paths of anything that might make someone trip, such as rocks or tools. ?Regularly check to see if handrails are loose or broken. Make sure that both sides of any steps have handrails. ?Any raised decks and porches should have guardrails on the edges. ?Have any leaves, snow, or ice cleared regularly. ?Use sand or salt on walking paths during winter. ?Clean up any spills in your garage right away. This includes oil or grease spills. ?What can I do in the bathroom? ?Use night lights. ?Install grab bars by the toilet and in the tub and shower. Do not use towel bars as grab bars. ?Use non-skid mats or decals in the tub or shower. ?If you need to sit down in the shower, use a plastic, non-slip stool. ?Keep the floor dry. Clean up any water that spills on the floor as soon as it happens. ?Remove soap buildup in the tub or shower regularly. ?Attach bath mats securely with double-sided non-slip rug tape. ?Do not have throw rugs and other things on the floor that can make you trip. ?What can I do in the bedroom? ?Use night lights. ?Make sure that you have a light by your bed that  is easy to reach. ?Do not use any sheets or blankets that are too big for your bed. They should not hang down onto the floor. ?Have a firm chair that has side arms. You can use this for support while you get dressed. ?Do not have throw rugs and other things on the floor that can make you trip. ?What can I do in the kitchen? ?Clean up any spills right away. ?Avoid walking on wet floors. ?Keep items that you use a lot in easy-to-reach places. ?If you need to reach something above you, use a strong step stool that has a grab bar. ?Keep electrical cords out of the way. ?Do not use floor polish or wax that makes floors slippery. If you must use wax, use non-skid floor wax. ?Do not have throw rugs and other things on the floor that can make you trip. ?What can I do with my stairs? ?Do not leave any items on the stairs. ?Make sure that there are handrails on both sides of the stairs and use them. Fix handrails that are broken or loose. Make sure that handrails are as long as the stairways. ?Check any carpeting to make sure that it is firmly attached to the stairs. Fix any carpet that is loose or worn. ?Avoid having throw rugs at the top or bottom of the stairs. If you do have throw rugs, attach them to the floor  with carpet tape. ?Make sure that you have a light switch at the top of the stairs and the bottom of the stairs. If you do not have them, ask someone to add them for you. ?What else can I do to help prevent falls? ?Wear shoes that: ?Do not have high heels. ?Have rubber bottoms. ?Are comfortable and fit you well. ?Are closed at the toe. Do not wear sandals. ?If you use a stepladder: ?Make sure that it is fully opened. Do not climb a closed stepladder. ?Make sure that both sides of the stepladder are locked into place. ?Ask someone to hold it for you, if possible. ?Clearly mark and make sure that you can see: ?Any grab bars or handrails. ?First and last steps. ?Where the edge of each step is. ?Use tools that help you  move around (mobility aids) if they are needed. These include: ?Canes. ?Walkers. ?Scooters. ?Crutches. ?Turn on the lights when you go into a dark area. Replace any light bulbs as soon as they burn out. ?Set up your

## 2022-04-07 NOTE — Progress Notes (Signed)
Patient returned my call with her blood pressure log.  Date  Reading Pulse  5/2  117/70  75  5/3  117/55  71  5/4  125/62  68  5/6  113/53  69  5/7  121/63  58  5/8  113/54  59  5/9  119/61  58  Charlene Brooke, CPP notified  Marijean Niemann, [place] Clinical Pharmacy Assistant [phone]

## 2022-04-08 DIAGNOSIS — H25813 Combined forms of age-related cataract, bilateral: Secondary | ICD-10-CM | POA: Diagnosis not present

## 2022-04-10 DIAGNOSIS — K7469 Other cirrhosis of liver: Secondary | ICD-10-CM | POA: Diagnosis not present

## 2022-04-10 DIAGNOSIS — R600 Localized edema: Secondary | ICD-10-CM | POA: Diagnosis not present

## 2022-04-17 DIAGNOSIS — R600 Localized edema: Secondary | ICD-10-CM | POA: Diagnosis not present

## 2022-04-27 ENCOUNTER — Encounter (HOSPITAL_COMMUNITY): Payer: Self-pay | Admitting: Gastroenterology

## 2022-04-27 DIAGNOSIS — H2511 Age-related nuclear cataract, right eye: Secondary | ICD-10-CM | POA: Diagnosis not present

## 2022-04-27 DIAGNOSIS — H2513 Age-related nuclear cataract, bilateral: Secondary | ICD-10-CM | POA: Diagnosis not present

## 2022-04-30 ENCOUNTER — Inpatient Hospital Stay (HOSPITAL_COMMUNITY)
Admission: EM | Admit: 2022-04-30 | Discharge: 2022-05-05 | DRG: 432 | Disposition: A | Discharge status: Home Health | Payer: Medicare Other | Attending: Internal Medicine | Admitting: Internal Medicine

## 2022-04-30 ENCOUNTER — Other Ambulatory Visit: Payer: Self-pay

## 2022-04-30 ENCOUNTER — Emergency Department (HOSPITAL_COMMUNITY): Payer: Medicare Other

## 2022-04-30 ENCOUNTER — Telehealth: Payer: Self-pay

## 2022-04-30 ENCOUNTER — Encounter (HOSPITAL_COMMUNITY): Payer: Self-pay

## 2022-04-30 DIAGNOSIS — Z8261 Family history of arthritis: Secondary | ICD-10-CM

## 2022-04-30 DIAGNOSIS — R6881 Early satiety: Secondary | ICD-10-CM | POA: Diagnosis present

## 2022-04-30 DIAGNOSIS — I864 Gastric varices: Secondary | ICD-10-CM | POA: Diagnosis present

## 2022-04-30 DIAGNOSIS — R161 Splenomegaly, not elsewhere classified: Secondary | ICD-10-CM | POA: Diagnosis present

## 2022-04-30 DIAGNOSIS — K7460 Unspecified cirrhosis of liver: Principal | ICD-10-CM | POA: Diagnosis present

## 2022-04-30 DIAGNOSIS — K7290 Hepatic failure, unspecified without coma: Secondary | ICD-10-CM | POA: Diagnosis not present

## 2022-04-30 DIAGNOSIS — E039 Hypothyroidism, unspecified: Secondary | ICD-10-CM | POA: Diagnosis not present

## 2022-04-30 DIAGNOSIS — Z9049 Acquired absence of other specified parts of digestive tract: Secondary | ICD-10-CM

## 2022-04-30 DIAGNOSIS — D7589 Other specified diseases of blood and blood-forming organs: Secondary | ICD-10-CM | POA: Diagnosis not present

## 2022-04-30 DIAGNOSIS — N390 Urinary tract infection, site not specified: Secondary | ICD-10-CM | POA: Diagnosis not present

## 2022-04-30 DIAGNOSIS — K8020 Calculus of gallbladder without cholecystitis without obstruction: Secondary | ICD-10-CM | POA: Diagnosis not present

## 2022-04-30 DIAGNOSIS — K7682 Hepatic encephalopathy: Secondary | ICD-10-CM | POA: Diagnosis present

## 2022-04-30 DIAGNOSIS — Z79890 Hormone replacement therapy (postmenopausal): Secondary | ICD-10-CM

## 2022-04-30 DIAGNOSIS — G9341 Metabolic encephalopathy: Secondary | ICD-10-CM | POA: Diagnosis not present

## 2022-04-30 DIAGNOSIS — I10 Essential (primary) hypertension: Secondary | ICD-10-CM | POA: Diagnosis not present

## 2022-04-30 DIAGNOSIS — B9620 Unspecified Escherichia coli [E. coli] as the cause of diseases classified elsewhere: Secondary | ICD-10-CM | POA: Diagnosis present

## 2022-04-30 DIAGNOSIS — Z79899 Other long term (current) drug therapy: Secondary | ICD-10-CM | POA: Diagnosis not present

## 2022-04-30 DIAGNOSIS — R339 Retention of urine, unspecified: Secondary | ICD-10-CM | POA: Diagnosis not present

## 2022-04-30 DIAGNOSIS — Z91041 Radiographic dye allergy status: Secondary | ICD-10-CM

## 2022-04-30 DIAGNOSIS — R9431 Abnormal electrocardiogram [ECG] [EKG]: Secondary | ICD-10-CM | POA: Diagnosis not present

## 2022-04-30 DIAGNOSIS — Z833 Family history of diabetes mellitus: Secondary | ICD-10-CM

## 2022-04-30 DIAGNOSIS — R410 Disorientation, unspecified: Secondary | ICD-10-CM | POA: Diagnosis not present

## 2022-04-30 DIAGNOSIS — R6889 Other general symptoms and signs: Secondary | ICD-10-CM | POA: Diagnosis not present

## 2022-04-30 DIAGNOSIS — Z95828 Presence of other vascular implants and grafts: Secondary | ICD-10-CM

## 2022-04-30 DIAGNOSIS — E870 Hyperosmolality and hypernatremia: Secondary | ICD-10-CM

## 2022-04-30 DIAGNOSIS — R531 Weakness: Secondary | ICD-10-CM | POA: Diagnosis not present

## 2022-04-30 DIAGNOSIS — E860 Dehydration: Secondary | ICD-10-CM | POA: Diagnosis not present

## 2022-04-30 DIAGNOSIS — D509 Iron deficiency anemia, unspecified: Secondary | ICD-10-CM | POA: Diagnosis not present

## 2022-04-30 DIAGNOSIS — E7220 Disorder of urea cycle metabolism, unspecified: Secondary | ICD-10-CM

## 2022-04-30 DIAGNOSIS — Z85038 Personal history of other malignant neoplasm of large intestine: Secondary | ICD-10-CM | POA: Diagnosis not present

## 2022-04-30 DIAGNOSIS — Z8679 Personal history of other diseases of the circulatory system: Secondary | ICD-10-CM | POA: Diagnosis present

## 2022-04-30 DIAGNOSIS — R112 Nausea with vomiting, unspecified: Secondary | ICD-10-CM | POA: Diagnosis present

## 2022-04-30 DIAGNOSIS — K766 Portal hypertension: Secondary | ICD-10-CM | POA: Diagnosis present

## 2022-04-30 DIAGNOSIS — N179 Acute kidney failure, unspecified: Secondary | ICD-10-CM | POA: Diagnosis present

## 2022-04-30 DIAGNOSIS — R7881 Bacteremia: Secondary | ICD-10-CM | POA: Diagnosis present

## 2022-04-30 DIAGNOSIS — Z743 Need for continuous supervision: Secondary | ICD-10-CM | POA: Diagnosis not present

## 2022-04-30 DIAGNOSIS — Z9221 Personal history of antineoplastic chemotherapy: Secondary | ICD-10-CM

## 2022-04-30 DIAGNOSIS — Z8249 Family history of ischemic heart disease and other diseases of the circulatory system: Secondary | ICD-10-CM

## 2022-04-30 DIAGNOSIS — R338 Other retention of urine: Secondary | ICD-10-CM

## 2022-04-30 DIAGNOSIS — A419 Sepsis, unspecified organism: Secondary | ICD-10-CM | POA: Diagnosis not present

## 2022-04-30 LAB — COMPREHENSIVE METABOLIC PANEL
ALT: 91 U/L — ABNORMAL HIGH (ref 0–44)
AST: 126 U/L — ABNORMAL HIGH (ref 15–41)
Albumin: 3.2 g/dL — ABNORMAL LOW (ref 3.5–5.0)
Alkaline Phosphatase: 206 U/L — ABNORMAL HIGH (ref 38–126)
Anion gap: 11 (ref 5–15)
BUN: 49 mg/dL — ABNORMAL HIGH (ref 8–23)
CO2: 28 mmol/L (ref 22–32)
Calcium: 10.3 mg/dL (ref 8.9–10.3)
Chloride: 103 mmol/L (ref 98–111)
Creatinine, Ser: 1.38 mg/dL — ABNORMAL HIGH (ref 0.44–1.00)
GFR, Estimated: 41 mL/min — ABNORMAL LOW (ref >60)
Glucose, Bld: 88 mg/dL (ref 70–99)
Potassium: 4.6 mmol/L (ref 3.5–5.1)
Sodium: 142 mmol/L (ref 135–145)
Total Bilirubin: 1.9 mg/dL — ABNORMAL HIGH (ref 0.3–1.2)
Total Protein: 7.2 g/dL (ref 6.5–8.1)

## 2022-04-30 LAB — BLOOD GAS, ARTERIAL
Acid-Base Excess: 6.4 mmol/L — ABNORMAL HIGH (ref 0.0–2.0)
Bicarbonate: 30.5 mmol/L — ABNORMAL HIGH (ref 20.0–28.0)
O2 Saturation: 98.9 %
Patient temperature: 37
pCO2 arterial: 41 mmHg (ref 32–48)
pH, Arterial: 7.48 — ABNORMAL HIGH (ref 7.35–7.45)
pO2, Arterial: 105 mmHg (ref 83–108)

## 2022-04-30 LAB — CBC WITH DIFFERENTIAL/PLATELET
Abs Immature Granulocytes: 0.03 10*3/uL (ref 0.00–0.07)
Basophils Absolute: 0 10*3/uL (ref 0.0–0.1)
Basophils Relative: 1 %
Eosinophils Absolute: 0.1 10*3/uL (ref 0.0–0.5)
Eosinophils Relative: 2 %
HCT: 40.4 % (ref 36.0–46.0)
Hemoglobin: 13.6 g/dL (ref 12.0–15.0)
Immature Granulocytes: 1 %
Lymphocytes Relative: 22 %
Lymphs Abs: 1.1 10*3/uL (ref 0.7–4.0)
MCH: 33.9 pg (ref 26.0–34.0)
MCHC: 33.7 g/dL (ref 30.0–36.0)
MCV: 100.7 fL — ABNORMAL HIGH (ref 80.0–100.0)
Monocytes Absolute: 0.7 10*3/uL (ref 0.1–1.0)
Monocytes Relative: 14 %
Neutro Abs: 2.9 10*3/uL (ref 1.7–7.7)
Neutrophils Relative %: 60 %
Platelets: 131 10*3/uL — ABNORMAL LOW (ref 150–400)
RBC: 4.01 MIL/uL (ref 3.87–5.11)
RDW: 17.1 % — ABNORMAL HIGH (ref 11.5–15.5)
WBC: 4.8 10*3/uL (ref 4.0–10.5)
nRBC: 0 % (ref 0.0–0.2)

## 2022-04-30 LAB — PROTIME-INR
INR: 1.4 — ABNORMAL HIGH (ref 0.8–1.2)
Prothrombin Time: 17.3 seconds — ABNORMAL HIGH (ref 11.4–15.2)

## 2022-04-30 LAB — AMMONIA: Ammonia: 148 umol/L — ABNORMAL HIGH (ref 9–35)

## 2022-04-30 LAB — APTT: aPTT: 35 seconds (ref 24–36)

## 2022-04-30 LAB — LACTIC ACID, PLASMA: Lactic Acid, Venous: 1.4 mmol/L (ref 0.5–1.9)

## 2022-04-30 MED ORDER — LACTULOSE ENEMA
300.0000 mL | Freq: Once | ORAL | Status: AC
  Administered 2022-04-30: 300 mL via RECTAL
  Filled 2022-04-30: qty 300

## 2022-04-30 MED ORDER — LACTULOSE 10 GM/15ML PO SOLN
30.0000 g | Freq: Once | ORAL | Status: AC
  Administered 2022-04-30: 30 g via ORAL
  Filled 2022-04-30: qty 60

## 2022-04-30 MED ORDER — SODIUM CHLORIDE 0.9 % IV BOLUS
1000.0000 mL | Freq: Once | INTRAVENOUS | Status: AC
  Administered 2022-04-30: 1000 mL via INTRAVENOUS

## 2022-04-30 MED ORDER — ONDANSETRON HCL 4 MG/2ML IJ SOLN
4.0000 mg | Freq: Once | INTRAMUSCULAR | Status: AC
  Administered 2022-04-30: 4 mg via INTRAVENOUS
  Filled 2022-04-30: qty 2

## 2022-04-30 NOTE — ED Provider Notes (Signed)
[facility] DEPT Provider Note   CSN: [id number] Arrival date & time: 04/30/22  1232     History  Chief Complaint  Patient presents with   Weakness    [name] is a 71 y.o. female.  71 yo F with a chief complaints of confusion.  This been going on for about the past 48 hours.  The husband noted that she is been progressively weak over the past month or so has not been eating the way she should.  She has been taking her lactulose as prescribed but is only having 1 bowel movement today.  She started having nausea and vomiting yesterday afternoon and persisted over the nights and in the morning.  She was very weak this morning and so he brought her in to the Emergency Department for evaluation.  He did call her hepatologist who thought was unlikely to be related to her cirrhosis.   Weakness      Home Medications Prior to Admission medications   Medication Sig Start Date End Date Taking? Authorizing Provider  Ascorbic Acid (VITAMIN C PO) Take 2,000 mg by mouth daily.    [provider]  AYR SALINE NASAL NA Place 1 spray into the nose daily as needed (congestion).    [provider]  Cholecalciferol (VITAMIN D3) 50 MCG (2000 UT) TABS Take 2,000 Units by mouth at bedtime.     [provider]  Cyanocobalamin (B-12) 5000 MCG CAPS Take 5,000 mcg by mouth daily.    [provider]  ELDERBERRY PO Take 15-30 mLs by mouth See admin instructions. Take 15 ml in the morning and 30 ml at night    [provider]  ENULOSE 10 GM/15ML SOLN Take 40 g by mouth 3 (three) times daily. 03/27/22   [provider]  furosemide (LASIX) 20 MG tablet Take 40 mg by mouth daily. 03/31/22 03/31/23  [provider]  gatifloxacin (ZYMAXID) 0.5 % SOLN Place 1 drop into the right eye 4 (four) times daily. 04/27/22   [provider]  levOCARNitine (CARNITOR) 330 MG tablet Take 330 mg by mouth 3 (three) times daily.  12/15/21   [provider]  levOCARNitine (L-CARNITINE) 500 MG TABS Take 500 mg by mouth daily.    [provider]  Multiple Vitamin (MULTI-VITAMIN) tablet Take 1 tablet by mouth daily.    [provider]  prednisoLONE acetate (PRED FORTE) 1 % ophthalmic suspension Place 1 drop into the right eye 4 (four) times daily. 04/27/22   [provider]  Sodium Sulfate-Mag Sulfate-KCl (SUTAB) 5624589035 MG TABS Take 1 kit by mouth as directed. MANUFACTURER CODES!! BIN: [id number] PCN: CN GROUP: [id number] MEMBER ID: [id number];RWE AS CASH;NO PRIOR AUTHORIZATION 03/24/22   Thornton Park, MD  spironolactone (ALDACTONE) 50 MG tablet Take 100 mg by mouth daily. 03/31/22 03/31/23  [provider]  SYNTHROID 75 MCG tablet Take 1 tablet (75 mcg total) by mouth daily before breakfast. 02/25/22   Lesleigh Noe, MD  zinc gluconate 50 MG tablet Take 50 mg by mouth 3 (three) times daily.    [provider]      Allergies    Contrast media [iodinated contrast media]    Review of Systems   Review of Systems  Neurological:  Positive for weakness.    Physical Exam Updated Vital Signs BP 122/68 (BP Location: Right Arm)   Pulse 81   Temp 98.5 F (36.9 C) (Rectal)   Resp 15   Ht '5\' 5"'  (  1.651 m)   Wt 72.1 kg   SpO2 96%   BMI 26.45 kg/m  Physical Exam Vitals and nursing note reviewed.  Constitutional:      General: She is not in acute distress.    Appearance: She is well-developed. She is not diaphoretic.  HENT:     Head: Normocephalic and atraumatic.  Eyes:     Pupils: Pupils are equal, round, and reactive to light.  Cardiovascular:     Rate and Rhythm: Normal rate and regular rhythm.     Heart sounds: No murmur heard.    No friction rub. No gallop.  Pulmonary:     Effort: Pulmonary effort is normal.     Breath sounds: No wheezing or rales.  Abdominal:     General: There is no distension.     Palpations: Abdomen is soft.     Tenderness: There is no  abdominal tenderness.  Musculoskeletal:        General: No tenderness.     Cervical back: Normal range of motion and neck supple.  Skin:    General: Skin is warm and dry.  Neurological:     Mental Status: She is alert and oriented to person, place, and time.  Psychiatric:        Behavior: Behavior normal.     ED Results / Procedures / Treatments   Labs (all labs ordered are listed, but only abnormal results are displayed) Labs Reviewed  COMPREHENSIVE METABOLIC PANEL - Abnormal; Notable for the following components:      Result Value   BUN 49 (*)    Creatinine, Ser 1.38 (*)    Albumin 3.2 (*)    AST 126 (*)    ALT 91 (*)    Alkaline Phosphatase 206 (*)    Total Bilirubin 1.9 (*)    GFR, Estimated 41 (*)    All other components within normal limits  CBC WITH DIFFERENTIAL/PLATELET - Abnormal; Notable for the following components:   MCV 100.7 (*)    RDW 17.1 (*)    Platelets 131 (*)    All other components within normal limits  PROTIME-INR - Abnormal; Notable for the following components:   Prothrombin Time 17.3 (*)    INR 1.4 (*)    All other components within normal limits  AMMONIA - Abnormal; Notable for the following components:   Ammonia 148 (*)    All other components within normal limits  CULTURE, BLOOD (ROUTINE X 2)  CULTURE, BLOOD (ROUTINE X 2)  URINE CULTURE  APTT  LACTIC ACID, PLASMA  LACTIC ACID, PLASMA  URINALYSIS, ROUTINE W REFLEX MICROSCOPIC    EKG EKG Interpretation  Date/Time:  Thursday April 30 2022 12:44:43 EDT Ventricular Rate:  80 PR Interval:  55 QRS Duration: 98 QT Interval:  390 QTC Calculation: 450 R Axis:   36 Text Interpretation: Sinus rhythm Short PR interval Right atrial enlargement No significant change since last tracing Confirmed by Deno Etienne [phone]) on 04/30/2022 1:07:46 PM  Radiology CT Head Wo Contrast  Result Date: 04/30/2022 CLINICAL DATA:  Weakness, nausea, confusion EXAM: CT HEAD WITHOUT CONTRAST TECHNIQUE: Contiguous  axial images were obtained from the base of the skull through the vertex without intravenous contrast. RADIATION DOSE REDUCTION: This exam was performed according to the departmental dose-optimization program which includes automated exposure control, adjustment of the mA and/or kV according to patient size and/or use of iterative reconstruction technique. COMPARISON:  11/27/2020 FINDINGS: Brain: Subtle hypodensity in the right frontal lobe (series 2, image 19)  which is new from the prior exam. No evidence of acute infarction, hemorrhage, cerebral edema, mass, mass effect, or midline shift. No hydrocephalus or extra-axial fluid collection. Vascular: No hyperdense vessel. Skull: Normal. Negative for fracture or focal lesion. Sinuses/Orbits: No acute finding. Other: The mastoid air cells are well aerated. IMPRESSION: Subtle hypodensity in the right frontal lobe, new from the prior exam, which is technically age indeterminate but could represent acute or subacute infarct. An MRI is recommended for further evaluation. These results were called by telephone at the time of interpretation on 04/30/2022 at 2:206 pm to provider Layan Zalenski , who verbally acknowledged these results. Electronically Signed   By: Merilyn Baba M.D.   On: 04/30/2022 14:20   DG Chest Port 1 View  Result Date: 04/30/2022 CLINICAL DATA:  Sepsis. EXAM: PORTABLE CHEST 1 VIEW COMPARISON:  02/02/2022 FINDINGS: 1254 hours. The cardio pericardial silhouette is enlarged. The lungs are clear without focal pneumonia, edema, pneumothorax or pleural effusion. Bones are diffusely demineralized. Telemetry leads overlie the chest. IMPRESSION: Low volume film without acute cardiopulmonary findings. Electronically Signed   By: Misty Stanley M.D.   On: 04/30/2022 13:07    Procedures Procedures    Medications Ordered in ED Medications  lactulose (CHRONULAC) 10 GM/15ML solution 30 g (has no administration in time range)  ondansetron (ZOFRAN) injection 4 mg  (has no administration in time range)  sodium chloride 0.9 % bolus 1,000 mL (1,000 mLs Intravenous New Bag/Given 04/30/22 1330)    ED Course/ Medical Decision Making/ A&P                           Medical Decision Making Amount and/or Complexity of Data Reviewed Labs: ordered. Radiology: ordered. ECG/medicine tests: ordered.  Risk Prescription drug management. Decision regarding hospitalization.   71 yo F with a cc of altered mental status.  The patient has a history of cirrhosis and has had prior presentations to this emergency department for hepatic encephalopathy in the past.  She thinks this feels somewhat different.  Her ammonia level however is 148.  No significant electrolyte abnormality.  LFTs are mildly elevated and may be slightly worse than previous.  CT scan of the head the radiologist was concerned for possible lesion in the right frontal lobe and is recommending MRI.  I discussed the results with the patient and family.  We will admit.  The patients results and plan were reviewed and discussed.   Any x-rays performed were independently reviewed by myself.   Differential diagnosis were considered with the presenting HPI.  Medications  lactulose (CHRONULAC) 10 GM/15ML solution 30 g (has no administration in time range)  ondansetron (ZOFRAN) injection 4 mg (has no administration in time range)  sodium chloride 0.9 % bolus 1,000 mL (1,000 mLs Intravenous New Bag/Given 04/30/22 1330)    Vitals:   04/30/22 1240 04/30/22 1244 04/30/22 1350  BP: 122/68    Pulse: 81    Resp: 15    Temp: (!) 97.2 F (36.2 C)  98.5 F (36.9 C)  TempSrc: Oral  Rectal  SpO2: 96%    Weight:  72.1 kg   Height:  '5\' 5"'  (1.651 m)     Final diagnoses:  Hepatic encephalopathy (HCC)    Admission/ observation were discussed with the admitting physician, patient and/or family and they are comfortable with the plan.          Final Clinical Impression(s) / ED Diagnoses Final diagnoses:   Hepatic  encephalopathy [facility])    Rx / DC Orders ED Discharge Orders     None         Deno Etienne, DO 04/30/22 1445

## 2022-04-30 NOTE — ED Triage Notes (Signed)
Patient presents with weakness, nausea  and confusion. She has difficulty answering questions appropriately. Symptoms began yesterday.   HX liver cirrhosis   EMS vitals: 80 CBG 110/60 BP 80 HR 98% SPO2 on room air

## 2022-04-30 NOTE — ED Notes (Signed)
Patient transported to MRI 

## 2022-04-30 NOTE — H&P (Signed)
History and Physical    Patient: [name] FKC:[id number] DOB: [birth year] DOA: 04/30/2022 DOS: the patient was seen and examined on 04/30/2022 PCP: Lesleigh Noe, MD  Patient coming from: Home  Chief Complaint:  Chief Complaint  Patient presents with   Weakness   HPI: [name] is a 71 y.o. female with medical history significant of cirrhosis of liver, hypothyroidism, stage 3b cecal adenocarcinoma s/p surgery. Presenting with altered mental status and weakness. History from husband at bedside. He reports that for the last week and a half, he's noticed changes in her gait and an overall slowing in her actions/mentation. She hasn't been complaining of balance problems. She hasn't had any falls. She just seems slower. Her appetite has been poorer during this time and she has had nausea and vomiting. He called her GI doc, who deferred to her PCP. After she had a couple of episodes of N/V this morning and increased confusion, he called her PCP. It was recommended that he come to the ED for evaluation. She had not had any fevers, diarrhea, sick contacts. He reports that she hasn't been keeping her lactulose down. He questions if it is causing her nausea. He otherwise denies aggravating or alleviating factors.     Review of Systems: unable to review all systems due to the inability of the patient to answer questions. Past Medical History:  Diagnosis Date   Cancer [facility])    cecum   Elevated liver function tests    Esophageal varices (HCC)    Hemorrhage of gastrointestinal tract 05/04/2011   Hypothyroidism    Iron deficiency anemia    Liver disease    chemotherapy complication, per pt, shunts placed to bypass liver   Malignant neoplasm of cecum (HCC)    Portal hypertension (HCC)    Splenomegaly    Past Surgical History:  Procedure Laterality Date   ESOPHAGEAL VARICE LIGATION     HEMICOLECTOMY  01/08/03   IR RADIOLOGIST EVAL & MGMT  12/20/2020   IR RADIOLOGIST EVAL & MGMT  05/29/2021    LIVER SURGERY     shunts placed after chemo complication   SKIN FULL THICKNESS GRAFT N/A 09/12/2019   Procedure: debridement and FTSG to the nose from left upper arm;  Surgeon: Cindra Presume, MD;  Location: [place];  Service: Plastics;  Laterality: N/A;  2 hours, please   TIPS PROCEDURE     Social History:  reports that she has never smoked. She has never used smokeless tobacco. She reports that she does not drink alcohol and does not use drugs.  Allergies  Allergen Reactions   Contrast Media [Iodinated Contrast Media] Hives    Family History  Problem Relation Age of Onset   Arthritis Mother    Hearing loss Mother    Heart disease Mother    Hypertension Mother    Arthritis Father    Diabetes Father    Heart disease Father    Breast cancer Neg Hx    Colon cancer Neg Hx    Esophageal cancer Neg Hx    Pancreatic cancer Neg Hx    Stomach cancer Neg Hx     Prior to Admission medications   Medication Sig Start Date End Date Taking? Authorizing Provider  Ascorbic Acid (VITAMIN C PO) Take 2,000 mg by mouth daily.    [provider]  AYR SALINE NASAL NA Place 1 spray into the nose daily as needed (congestion).    [provider]  Cholecalciferol (  VITAMIN D3) 50 MCG (2000 UT) TABS Take 2,000 Units by mouth at bedtime.     [provider]  Cyanocobalamin (B-12) 5000 MCG CAPS Take 5,000 mcg by mouth daily.    [provider]  ELDERBERRY PO Take 15-30 mLs by mouth See admin instructions. Take 15 ml in the morning and 30 ml at night    [provider]  ENULOSE 10 GM/15ML SOLN Take 40 g by mouth 3 (three) times daily. 03/27/22   [provider]  furosemide (LASIX) 20 MG tablet Take 40 mg by mouth daily. 03/31/22 03/31/23  [provider]  gatifloxacin (ZYMAXID) 0.5 % SOLN Place 1 drop into the right eye 4 (four) times daily. 04/27/22   [provider]  levOCARNitine (CARNITOR) 330 MG tablet Take 330 mg by  mouth 3 (three) times daily. 12/15/21   [provider]  levOCARNitine (L-CARNITINE) 500 MG TABS Take 500 mg by mouth daily.    [provider]  Multiple Vitamin (MULTI-VITAMIN) tablet Take 1 tablet by mouth daily.    [provider]  prednisoLONE acetate (PRED FORTE) 1 % ophthalmic suspension Place 1 drop into the right eye 4 (four) times daily. 04/27/22   [provider]  Sodium Sulfate-Mag Sulfate-KCl (SUTAB) [phone] MG TABS Take 1 kit by mouth as directed. MANUFACTURER CODES!! BIN: [id number] PCN: CN GROUP: [id number] MEMBER ID: [id number];BWL AS CASH;NO PRIOR AUTHORIZATION 03/24/22   Thornton Park, MD  spironolactone (ALDACTONE) 50 MG tablet Take 100 mg by mouth daily. 03/31/22 03/31/23  [provider]  SYNTHROID 75 MCG tablet Take 1 tablet (75 mcg total) by mouth daily before breakfast. 02/25/22   Lesleigh Noe, MD  zinc gluconate 50 MG tablet Take 50 mg by mouth 3 (three) times daily.    [provider]    Physical Exam: Vitals:   04/30/22 1240 04/30/22 1244 04/30/22 1350 04/30/22 1505  BP: 122/68   123/64  Pulse: 81   91  Resp: 15   13  Temp: (!) 97.2 F (36.2 C)  98.5 F (36.9 C)   TempSrc: Oral  Rectal   SpO2: 96%   93%  Weight:  72.1 kg    Height:  _0  (1.651 m)     General: 71 y.o. female resting in bed in NAD Eyes: PERRL, normal sclera ENMT: Nares patent w/o discharge, orophaynx clear, dentition normal, ears w/o discharge/lesions/ulcers Neck: Supple, trachea midline Cardiovascular: RRR, +S1, S2, no m/g/r, equal pulses throughout Respiratory: CTABL, no w/r/r, normal WOB GI: BS+, NDNT, no masses noted, no organomegaly noted MSK: No e/c/c Neuro: A&O x 2 (name, place), no focal deficits Psyc: lethargic, confused  Data Reviewed:  Na+  142 K+  4.6 BUN  49 Scr  1.38 Alk phos  206 AST  126 ALT  91 Ammonia  148 T bili  1.9 WBC  4.8  CXR: Low volume film without acute cardiopulmonary findings.  CTH: Subtle  hypodensity in the right frontal lobe, new from the prior exam, which is technically age indeterminate but could represent acute or subacute infarct. An MRI is recommended for further evaluation.  MRI brain: No acute or reversible finding. Moderate chronic small-vessel ischemic changes of the cerebral hemispheric white matter.  Assessment and Plan: Acute hepatic encephalopathy Hyperammonemia Cirrhosis of liver Portal HTN s/p TIPS     - admit to inpt, tele     - lactulose enema; transition to lactulose PO of xifaxan as she becomes more alert     - fluids;  follow     - check UA  Hyperbilirubinemia     - check fractionated bili, [place] RUQ ab  AKI     - hold lasix     - check renal US     - watch nephrotoxins     - fluids  Hypothyroidism     - continue home regimen when confirmed  Advance Care Planning:   Code Status: FULL  Consults: None  Family Communication: w/ husband at bedside  Severity of Illness: The appropriate patient status for this patient is INPATIENT. Inpatient status is judged to be reasonable and necessary in order to provide the required intensity of service to ensure the patient's safety. The patient's presenting symptoms, physical exam findings, and initial radiographic and laboratory data in the context of their chronic comorbidities is felt to place them at high risk for further clinical deterioration. Furthermore, it is not anticipated that the patient will be medically stable for discharge from the hospital within 2 midnights of admission.   * I certify that at the point of admission it is my clinical judgment that the patient will require inpatient hospital care spanning beyond 2 midnights from the point of admission due to high intensity of service, high risk for further deterioration and high frequency of surveillance required.*  Author: Jonnie Finner, DO 04/30/2022 3:39 PM  For on call review [email or web address].

## 2022-04-30 NOTE — Telephone Encounter (Signed)
[place] [facility] - Client TELEPHONE ADVICE RECORD Access[facility]urse Patient Name: [name] E Gender: Female DOB: 1951-03-14 Age: 71 Y 11 M 1 D Return Phone Number: [phone] (Secondary) Address: City/ State/ Zip: [place]  [address] Client [place] [facility] - Client Client Site [place] Provider Waunita Schooner- MD Contact Type Call Who Is Calling Patient / Member / Family / Caregiver Call Type Triage / Clinical Caller Name Alek Poncedeleon Relationship To Patient Son Return Phone Number [phone] (Secondary) Chief Complaint CONFUSION - new onset Reason for Call Symptomatic / Request for Health Information Initial Comment His wife is incoherent, not eating well, has been sick the past week. Translation No Nurse Assessment Nurse: Ysidro Evert, RN, Levada Dy Date/Time (Eastern Time): 04/30/2022 11:01:23 AM Confirm and document reason for call. If symptomatic, describe symptoms. ---Caller states his wife was throwing up around 4am this morning. She is having new onset of confusion. She seems very fatigued Does the patient have any new or worsening symptoms? ---Yes Will a triage be completed? ---Yes Related visit to physician within the last 2 weeks? ---No Does the PT have any chronic conditions? (i.e. diabetes, asthma, this includes High risk factors for pregnancy, etc.) ---Yes List chronic conditions. ---cirrhosis of the liver Is this a behavioral health or substance abuse call? ---No Guidelines Guideline Title Affirmed Question Affirmed Notes Nurse Date/Time (Eastern Time) Blood Pressure - Low Difficult to awaken or acting confused (e.g., disoriented, slurred speech) Ysidro Evert, RN, Levada Dy 04/30/2022 11:06:02 AM Disp. Time Eilene Ghazi Time) Disposition Final User 04/30/2022 11:00:01 AM Send to Urgent Queue Donato Heinz 04/30/2022 11:13:38 AM 911 Outcome Documentation Ysidro Evert, RN, Levada Dy PLEASE NOTE: All  timestamps contained within this report are represented as [place] Standard Time. CONFIDENTIALTY NOTICE: This fax transmission is intended only for the addressee. It contains information that is legally privileged, confidential or otherwise protected from use or disclosure. If you are not the intended recipient, you are strictly prohibited from reviewing, disclosing, copying using or disseminating any of this information or taking any action in reliance on or regarding this information. If you have received this fax in error, please notify us immediately by telephone so that we can arrange for its return to [place]. Phone: [phone], Toll-Free: [phone], Fax: [phone] Page: 2 of 2 Call Id: [id number] [employer]. Time (Eastern Time) Disposition Final User Reason: Caller states he will call the ambulance. Called back in 5 minutes and he states the ambulance is on the way 04/30/2022 11:08:18 AM Call EMS 911 Now Yes Ysidro Evert, RN, [place] Shutter Disagree/Comply Comply Caller Understands Yes PreDisposition Did not know what to do Care Advice Given Per Guideline CALL EMS 911 NOW: * Triager Discretion: I'll call you back in a few minutes to be sure you were able to reach them. * Immediate medical attention is needed. You need to hang up and call 911 (or an ambulance). * Lie down with the feet elevated. CARE ADVICE given per Low Blood Pressure (Adult) guideline

## 2022-04-30 NOTE — Telephone Encounter (Signed)
Per chart review pt is presently at [facility]. Sending note to Dr Einar Pheasant and [facility] CMA.

## 2022-05-01 ENCOUNTER — Inpatient Hospital Stay (HOSPITAL_COMMUNITY): Payer: Medicare Other

## 2022-05-01 DIAGNOSIS — K7682 Hepatic encephalopathy: Secondary | ICD-10-CM | POA: Diagnosis not present

## 2022-05-01 LAB — URINALYSIS, ROUTINE W REFLEX MICROSCOPIC
Bilirubin Urine: NEGATIVE
Glucose, UA: 100 mg/dL — AB
Ketones, ur: NEGATIVE mg/dL
Leukocytes,Ua: NEGATIVE
Nitrite: NEGATIVE
Protein, ur: NEGATIVE mg/dL
RBC / HPF: >50 RBC/hpf — ABNORMAL HIGH (ref 0–5)
Specific Gravity, Urine: <=1.005 (ref 1.005–1.030)
pH: 7 (ref 5.0–8.0)

## 2022-05-01 LAB — BLOOD CULTURE ID PANEL (REFLEXED) - BCID2

## 2022-05-01 LAB — BILIRUBIN, FRACTIONATED(TOT/DIR/INDIR)
Bilirubin, Direct: 0.5 mg/dL — ABNORMAL HIGH (ref 0.0–0.2)
Indirect Bilirubin: 2.2 mg/dL — ABNORMAL HIGH (ref 0.3–0.9)
Total Bilirubin: 2.7 mg/dL — ABNORMAL HIGH (ref 0.3–1.2)

## 2022-05-01 LAB — COMPREHENSIVE METABOLIC PANEL
ALT: 82 U/L — ABNORMAL HIGH (ref 0–44)
AST: 107 U/L — ABNORMAL HIGH (ref 15–41)
Albumin: 3.2 g/dL — ABNORMAL LOW (ref 3.5–5.0)
Alkaline Phosphatase: 183 U/L — ABNORMAL HIGH (ref 38–126)
Anion gap: 9 (ref 5–15)
BUN: 44 mg/dL — ABNORMAL HIGH (ref 8–23)
CO2: 26 mmol/L (ref 22–32)
Calcium: 9.7 mg/dL (ref 8.9–10.3)
Chloride: 107 mmol/L (ref 98–111)
Creatinine, Ser: 1.25 mg/dL — ABNORMAL HIGH (ref 0.44–1.00)
GFR, Estimated: 46 mL/min — ABNORMAL LOW (ref >60)
Glucose, Bld: 82 mg/dL (ref 70–99)
Potassium: 4.9 mmol/L (ref 3.5–5.1)
Sodium: 142 mmol/L (ref 135–145)
Total Bilirubin: 2.4 mg/dL — ABNORMAL HIGH (ref 0.3–1.2)
Total Protein: 7 g/dL (ref 6.5–8.1)

## 2022-05-01 LAB — CBC
HCT: 39.5 % (ref 36.0–46.0)
Hemoglobin: 13.1 g/dL (ref 12.0–15.0)
MCH: 34.2 pg — ABNORMAL HIGH (ref 26.0–34.0)
MCHC: 33.2 g/dL (ref 30.0–36.0)
MCV: 103.1 fL — ABNORMAL HIGH (ref 80.0–100.0)
Platelets: 116 10*3/uL — ABNORMAL LOW (ref 150–400)
RBC: 3.83 MIL/uL — ABNORMAL LOW (ref 3.87–5.11)
RDW: 17.4 % — ABNORMAL HIGH (ref 11.5–15.5)
WBC: 7.6 10*3/uL (ref 4.0–10.5)
nRBC: 0 % (ref 0.0–0.2)

## 2022-05-01 LAB — LACTIC ACID, PLASMA: Lactic Acid, Venous: 1 mmol/L (ref 0.5–1.9)

## 2022-05-01 LAB — AMMONIA: Ammonia: 115 umol/L — ABNORMAL HIGH (ref 9–35)

## 2022-05-01 MED ORDER — LACTULOSE 10 GM/15ML PO SOLN
30.0000 g | Freq: Three times a day (TID) | ORAL | Status: DC
  Administered 2022-05-01: 30 g via ORAL
  Filled 2022-05-01: qty 60

## 2022-05-01 MED ORDER — LACTULOSE ENEMA
300.0000 mL | Freq: Every day | ORAL | Status: DC
  Administered 2022-05-01 – 2022-05-03 (×3): 300 mL via RECTAL
  Filled 2022-05-01 (×4): qty 300

## 2022-05-01 MED ORDER — PROCHLORPERAZINE EDISYLATE 10 MG/2ML IJ SOLN
10.0000 mg | Freq: Four times a day (QID) | INTRAMUSCULAR | Status: DC | PRN
  Administered 2022-05-01 – 2022-05-02 (×2): 10 mg via INTRAVENOUS
  Filled 2022-05-01 (×2): qty 2

## 2022-05-01 MED ORDER — SODIUM CHLORIDE 0.9 % IV SOLN: Freq: Once | INTRAVENOUS | Status: AC

## 2022-05-01 MED ORDER — VANCOMYCIN HCL 1250 MG/250ML IV SOLN
1250.0000 mg | INTRAVENOUS | Status: DC
  Administered 2022-05-03: 1250 mg via INTRAVENOUS
  Filled 2022-05-01: qty 250

## 2022-05-01 MED ORDER — VANCOMYCIN HCL 1500 MG/300ML IV SOLN
1500.0000 mg | Freq: Once | INTRAVENOUS | Status: AC
  Administered 2022-05-01: 1500 mg via INTRAVENOUS
  Filled 2022-05-01: qty 300

## 2022-05-01 MED ORDER — LIP MEDEX EX OINT
TOPICAL_OINTMENT | CUTANEOUS | Status: DC | PRN
  Filled 2022-05-01: qty 7

## 2022-05-01 MED ORDER — LEVOTHYROXINE SODIUM 75 MCG PO TABS
75.0000 ug | ORAL_TABLET | Freq: Every day | ORAL | Status: DC
  Administered 2022-05-02 – 2022-05-05 (×3): 75 ug via ORAL
  Filled 2022-05-01 (×4): qty 1

## 2022-05-01 MED ORDER — SODIUM CHLORIDE 0.9 % IV SOLN
Freq: Once | INTRAVENOUS | Status: AC
Start: 2022-05-01 — End: 2022-05-02

## 2022-05-01 NOTE — Progress Notes (Signed)
Pharmacy Antibiotic Note  [name] is a 71 y.o. female admitted on 04/30/2022 with acute hepatic encephalopathy, hyperammonemia, cirrhosis of liver, portal HTN s/p TIPS, hyperbilirubinemia, and AKI.   Today, microbiology reported positive blood cultures.  Pharmacy has been consulted for vancomycin dosing for bacteremia.  Plan: Vancomycin 1500 mg IV x 1 then Vancomycin 1250 mg IV every 36 hours (Goal AUC 400-550. Expected AUC: 503.7. SCr used: 1.25) Monitor clinical progress, renal function, vancomycin levels as indicated F/U C&S, ID plans    Height: '5\' 5"'$  (165.1 cm) Weight: 64.4 kg (141 lb 15.6 oz) IBW/kg (Calculated) : 57  Temp (24hrs), Avg:98 F (36.7 C), Min:97.8 F (36.6 C), Max:98.1 F (36.7 C)  Recent Labs  Lab 04/30/22 0300 04/30/22 1251 04/30/22 1340 05/01/22 0715  WBC  --  4.8  --  7.6  CREATININE  --  1.38*  --  1.25*  LATICACIDVEN 1.0  --  1.4  --     Estimated Creatinine Clearance: 37.7 mL/min (A) (by C-G formula based on SCr of 1.25 mg/dL (H)).    Allergies  Allergen Reactions   Contrast Media [Iodinated Contrast Media] Hives    Antimicrobials this admission: 6/9 vancomycin >>   Microbiology results: 6/8 BCx: 3 out of 4 bottles with gpc in clusters; BCID staph species, no resistance detected 6/9 UCx: sent    Thank you for allowing pharmacy to be a part of this patient's care.  Royetta Asal, PharmD, BCPS Clinical Pharmacist [place] Please utilize Amion for appropriate phone number to reach the unit pharmacist ([place]) 05/01/2022 5:55 PM

## 2022-05-01 NOTE — TOC Initial Note (Signed)
Transition of Care [facility]) - Initial/Assessment Note    Patient Details  Name: [name] MRN: [id number] Date of Birth: 1951-06-22  Transition of Care [facility]) CM/SW Contact:    Leeroy Cha, RN Phone Number: 05/01/2022, 9:02 AM  Clinical Narrative:                  Transition of Care [facility]) Screening Note   Patient Details  Name: [name] Date of Birth: 07-18-1951   Transition of Care [facility]) CM/SW Contact:    Leeroy Cha, RN Phone Number: 05/01/2022, 9:02 AM    Transition of Care Department [facility]) has reviewed patient and no TOC needs have been identified at this time. We will continue to monitor patient advancement through interdisciplinary progression rounds. If new patient transition needs arise, please place a TOC consult.    Expected Discharge Plan: Home/Self Care Barriers to Discharge: Continued Medical Work up   Patient Goals and CMS Choice Patient states their goals for this hospitalization and ongoing recovery are:: to go home CMS Medicare.gov Compare Post Acute Care list provided to:: Patient    Expected Discharge Plan and Services Expected Discharge Plan: Home/Self Care   Discharge Planning Services: CM Consult   Living arrangements for the past 2 months: Single Family Home                                      Prior Living Arrangements/Services Living arrangements for the past 2 months: Single Family Home Lives with:: Self Patient language and need for interpreter reviewed:: Yes Do you feel safe going back to the place where you live?: Yes               Activities of Daily Living Home Assistive Devices/Equipment: Eyeglasses ADL Screening (condition at time of admission) Patient's cognitive ability adequate to safely complete daily activities?: No Is the patient deaf or have difficulty hearing?: No (had wax build up removed) Does the patient have difficulty seeing, even when wearing glasses/contacts?: Yes (has bilateral  cataracts - to start surgery in July 2023) Does the patient have difficulty concentrating, remembering, or making decisions?: Yes Patient able to express need for assistance with ADLs?: No Does the patient have difficulty dressing or bathing?: Yes Independently performs ADLs?: No (normally independent , has become dependent within the last 12 hours) Communication: Independent Dressing (OT): Needs assistance Is this a change from baseline?: Change from baseline, expected to last >3 days Grooming: Needs assistance Is this a change from baseline?: Change from baseline, expected to last >3 days Feeding: Needs assistance Is this a change from baseline?: Change from baseline, expected to last >3 days Bathing: Needs assistance Is this a change from baseline?: Change from baseline, expected to last >3 days Toileting: Needs assistance Is this a change from baseline?: Change from baseline, expected to last >3days In/Out Bed: Needs assistance Is this a change from baseline?: Change from baseline, expected to last >3 days Walks in Home: Dependent Is this a change from baseline?: Change from baseline, expected to last >3 days Does the patient have difficulty walking or climbing stairs?: Yes Weakness of Legs: Both Weakness of Arms/Hands: Both  Permission Sought/Granted                  Emotional Assessment       Orientation: : Fluctuating Orientation (Suspected and/or reported Sundowners) Alcohol / Substance Use: Not Applicable Psych  Involvement: No (comment)  Admission diagnosis:  Hepatic encephalopathy ([place]) [K76.82] Patient Active Problem List   Diagnosis Date Noted   AKI (acute kidney injury) ([place]) 04/30/2022   Hyperbilirubinemia 04/30/2022   Hyperammonemia ([place]) 04/30/2022   Hypokalemia 02/05/2022   Sinus bradycardia 02/05/2022   Hypomagnesemia 02/04/2022   Pancytopenia ([place]) 02/04/2022   Hepatic encephalopathy ([place]) 44/31/5400   Acute metabolic encephalopathy 86/76/1950    UTI (urinary tract infection) 02/02/2022   Basal cell carcinoma (BCC) 05/06/2021   Cecal cancer ([place]) 05/06/2021   S/P TIPS (transjugular intrahepatic portosystemic shunt) 05/06/2021   Hypertension 05/06/2021   COVID-19 vaccination declined 01/30/2021   Decompensated liver cirrhosis with portal HTN and gastric varices 12/04/2020   Essential hypertension 08/12/2020   Murmur, cardiac 03/19/2020   Chronic pain of both knees 01/29/2020   Morbid obesity ([place]) 01/29/2020   Hypothyroidism 06/30/2019   History of basal cell cancer 06/30/2019   Hx of colon cancer, stage III 11/30/2011   Esophageal varices ([place]) 06/12/2010   Portal hypertension ([place]) 01/23/2008   PCP:  Lesleigh Noe, MD Pharmacy:   [facility] [address] [place] [address] Phone: [phone] Fax: [phone]     Social Determinants of Health (SDOH) Interventions    Readmission Risk Interventions     No data to display

## 2022-05-01 NOTE — Progress Notes (Signed)
PHARMACY - PHYSICIAN COMMUNICATION CRITICAL VALUE ALERT - BLOOD CULTURE IDENTIFICATION (BCID)  [name] is an 71 y.o. female who presented to [facility] on 04/30/2022 with a chief complaint of weakness.  Past medical history is significant of cirrhosis of liver, hypothyroidism, stage 3b cecal adenocarcinoma s/p surgery.  Assessment:   3 out of 4 blood cultures positive for gram positive cocci in clusters in both aerobic and anaerobic bottles.  BCID panel detected staphylococcus species, no resistance.  Name of physician (or Provider) Contacted: [place]  Current antibiotics: none  Changes to prescribed antibiotics recommended:  Recommendations accepted by provider to initiate vancomycin per protocol.  Results for orders placed or performed during the hospital encounter of 04/30/22  Blood Culture ID Panel (Reflexed) (Collected: 04/30/2022 12:48 PM)  Result Value Ref Range   Enterococcus faecalis NOT DETECTED NOT DETECTED   Enterococcus Faecium NOT DETECTED NOT DETECTED   Listeria monocytogenes NOT DETECTED NOT DETECTED   Staphylococcus species DETECTED (A) NOT DETECTED   Staphylococcus aureus (BCID) NOT DETECTED NOT DETECTED   Staphylococcus epidermidis NOT DETECTED NOT DETECTED   Staphylococcus lugdunensis NOT DETECTED NOT DETECTED   Streptococcus species NOT DETECTED NOT DETECTED   Streptococcus agalactiae NOT DETECTED NOT DETECTED   Streptococcus pneumoniae NOT DETECTED NOT DETECTED   Streptococcus pyogenes NOT DETECTED NOT DETECTED   A.calcoaceticus-baumannii NOT DETECTED NOT DETECTED   Bacteroides fragilis NOT DETECTED NOT DETECTED   Enterobacterales NOT DETECTED NOT DETECTED   Enterobacter cloacae complex NOT DETECTED NOT DETECTED   Escherichia coli NOT DETECTED NOT DETECTED   Klebsiella aerogenes NOT DETECTED NOT DETECTED   Klebsiella oxytoca NOT DETECTED NOT DETECTED   Klebsiella pneumoniae NOT DETECTED NOT DETECTED   Proteus species NOT DETECTED NOT DETECTED   Salmonella  species NOT DETECTED NOT DETECTED   Serratia marcescens NOT DETECTED NOT DETECTED   Haemophilus influenzae NOT DETECTED NOT DETECTED   Neisseria meningitidis NOT DETECTED NOT DETECTED   Pseudomonas aeruginosa NOT DETECTED NOT DETECTED   Stenotrophomonas maltophilia NOT DETECTED NOT DETECTED   Candida albicans NOT DETECTED NOT DETECTED   Candida auris NOT DETECTED NOT DETECTED   Candida glabrata NOT DETECTED NOT DETECTED   Candida krusei NOT DETECTED NOT DETECTED   Candida parapsilosis NOT DETECTED NOT DETECTED   Candida tropicalis NOT DETECTED NOT DETECTED   Cryptococcus neoformans/gattii NOT DETECTED NOT DETECTED    Royetta Asal, PharmD, BCPS Clinical Pharmacist [place] Please utilize Amion for appropriate phone number to reach the unit pharmacist ([place]) 05/01/2022 5:49 PM

## 2022-05-01 NOTE — Progress Notes (Signed)
Initial Nutrition Assessment  DOCUMENTATION CODES:   Not applicable  INTERVENTION:  - diet advancement as medically feasible. - will order interventions with diet advancement. - complete NFPE when feasible.    NUTRITION DIAGNOSIS:   Inadequate oral intake related to acute illness, decreased appetite, early satiety as evidenced by per patient/family report.  GOAL:   Patient will meet greater than or equal to 90% of their needs  MONITOR:   Diet advancement, Labs, Weight trends  REASON FOR ASSESSMENT:   Malnutrition Screening Tool, Consult Assessment of nutrition requirement/status  ASSESSMENT:   71 y.o. female with medical history of liver cirrhosis, portal HTN, splenomegaly, esophageal varices s/p TIPS, stage 3 cecal adenocarcinoma s/p surgery, hypothyroidism, iron deficiency anemia. She presented to the ED due to progressive decrease in oral intake, generalized weakness x1 month, vomiting, and increased confusion. She was admitted for hepatic encephalopathy.  Patient sleeping with her husband at bedside. Husband provides all information  He shares that nutrition intake has declined over the past at least 2 months. She has been encouraged to reach a goal of 90 grams of protein/day. This has become more difficult.   She experiences early satiety and has been struggling with intakes of food and liquids on top of oral lactulose. She does not have any pain or difficulty with chewing or swallowing.   We discussed ways to increase oral intake such as separating times that she drinks liquids vs eats solid foods, not consuming carbonated beverages, not using straws, adding unflavored protein powder to things, using oral nutrition supplements such as Premier Protein to make smoothies and milkshakes, making eating a social event when possible.  Her husband shares some of the emotional difficulties of medical course and underlying diagnoses.   Weight today is 142 lb and weight on 3/31  was 159 lb. This indicates 17 lb weight loss (10.7% body weight) in the past 2.5 months; significant for time frame.  Suspect some degree of malnutrition but unable to confirm without completing NFPE.  Encouraged her husband to let staff know if additional nutrition-related questions, concerns, or needs arise and RD is more than happy to stop back.    Labs reviewed; BUN: 44 mg/dl, creatinine:L 1.25 mg/dl, Alk Phos and AST elevated but trending down, GFR: 46 mg/dl.  Medications reviewed; 300 ml rectal lactulose/day, 75 mcg oral synthroid/day.    NUTRITION - FOCUSED PHYSICAL EXAM:  Deferred while patient sleeping soundly.  Diet Order:   Diet Order             Diet NPO time specified Except for: Ice Chips, Sips with Meds  Diet effective now                   EDUCATION NEEDS:   Education needs have been addressed  Skin:  Skin Assessment: Reviewed RN Assessment  Last BM:  PTA/unknown  Height:   Ht Readings from Last 1 Encounters:  05/01/22 '5\' 5"'  (1.651 m)    Weight:   Wt Readings from Last 1 Encounters:  05/01/22 64.4 kg    BMI:  Body mass index is 23.63 kg/m.  Estimated Nutritional Needs:  Kcal:  1930-2250 kcal Protein:  90-100 grams Fluid:  >/= 1.8 L/day     Jarome Matin, MS, RD, LDN Registered Dietitian II Inpatient Clinical Nutrition RD pager # and on-call/weekend pager # available in [facility]

## 2022-05-01 NOTE — Progress Notes (Signed)
PROGRESS NOTE  [name]  DOB: 1951-08-14  PCP: Lesleigh Noe, MD QQV:[id number]  DOA: 04/30/2022  LOS: 1 day  Hospital Day: 2  Brief narrative: [name] is a 71 y.o. female with PMH significant for liver cirrhosis, with portal hypertension, splenomegaly, esophageal varices s/p TIPS, stage IIIb cecal adenocarcinoma status post surgery, hypothyroidism, iron deficiency anemia. Per family patient has been progressively losing her oral intake and getting generalized weakness for about a month.  On 6/7, patient started having vomiting, became more confused, weak. She was not able to keep her medicines down. Symptoms persisted overnight and hence she was brought to the ED on 6/8. Per family, patient has liver cirrhosis for several years.  She had TIPS done in 2011 after which she had remarkable improvement in her quality of life.  She continues to work as a Proofreader and is physically independent until about a month ago when she started to decline.  In the ED, patient was afebrile, hemodynamically stable, breathing on room air. Labs showed stable CBC, BMP with BUN/creatinine elevated to 49/1.38 Blood gas with pH 7.48, PCO2 41, bicarb 30. Ammonia level elevated to 148. Ultrasound abdomen showed cholelithiasis without acute cholecystitis.   Admitted to hospitalist service  Subjective: Patient was seen and examined this morning.  Pleasant elderly Caucasian female.  Thin built.  Not in distress.  Oriented to place and person, not to time husband at bedside. Chart reviewed. Labs this morning with creatinine slightly better at 1.25, LFTs elevated, ammonia elevated 215, total bilirubin elevated  Principal Problem:   Hepatic encephalopathy (HCC) Active Problems:   Decompensated liver cirrhosis with portal HTN and gastric varices   Portal hypertension (HCC)   Hypothyroidism   S/P TIPS (transjugular intrahepatic portosystemic shunt)   AKI (acute kidney injury) ([place])    Hyperbilirubinemia   Hyperammonemia (HCC)    Assessment and plan: Acute hepatic encephalopathy Hyperammonemia -On admission, she was started on lactulose enema. -Ammonia level gradually improving.  Continue lactulose enema. -Family states that patient was having to take a lot of lactulose every day to have 1-2 bowel movements.  The volume of lactulose was affecting her appetite and making her nauseous as well.  I wonder if she should rather be on lactulose enema at home on a regular basis.  Patient and husband are considering this. -Continue to monitor. Recent Labs  Lab 04/30/22 1307 05/01/22 0715  AMMONIA 148* 115*   Cirrhosis of liver Portal HTN s/p TIPS -On Lasix and Aldactone at home.  Currently on hold because of AKI. -Follows up with GI NP Dawn Drozik at [facility]  AKI -Normal creatinine at baseline.  Presented with creatinine elevated to 1.38.  Lasix and Aldactone on hold.  Creatinine slightly better at 1.25 today.  Renal ultrasound unremarkable -Continue normal saline at 75 mill per hour.  I will continue the same for next 24 hours and reassess. Recent Labs    02/02/22 1710 02/03/22 0456 02/04/22 0523 02/05/22 0745 02/09/22 1445 02/20/22 1606 02/26/22 1333 03/06/22 1236 04/30/22 1251 05/01/22 0715  BUN '18 19 22 19 '$ 29* 47* 47* 28* 49* 44*  CREATININE 0.77 0.74 0.85 0.80 1.19 1.50* 1.35* 0.87 1.38* 1.25*   Hypothyroidism -Continue Synthroid  Poor nutrition Body mass index is 23.63 kg/m. -Dietitian consulted  Goals of care   Code Status: Full Code    Mobility: Encourage ambulation.  Obtain PT eval  Skin assessment:     Nutritional status:  Body mass index is 23.63 kg/m.  Diet:  Diet Order             Diet NPO time specified Except for: Ice Chips, Sips with Meds  Diet effective now                   DVT prophylaxis:  SCDs Start: 05/01/22 0256   Antimicrobials: None Fluid: NS at 75 for next 24 hours Consultants: None Family  Communication: Husband at bedside  Status is: Inpatient  Continue in-hospital care because: Needs IV fluid, ammonia level monitoring Level of care: Telemetry   Dispo: The patient is from: Home              Anticipated d/c is to: Hopefully home in 1 to 2 days              Patient currently is not medically stable to d/c.   Difficult to place patient No     Infusions:   sodium chloride      Scheduled Meds:  lactulose  300 mL Rectal Daily   [START ON 05/02/2022] levothyroxine  75 mcg Oral QAC breakfast    PRN meds: prochlorperazine   Antimicrobials: Anti-infectives (From admission, onward)    None       Objective: Vitals:   05/01/22 0702 05/01/22 1114  BP: 111/67 114/60  Pulse: 68 75  Resp: 17   Temp: 97.8 F (36.6 C) 98.1 F (36.7 C)  SpO2: 95% 94%   No intake or output data in the 24 hours ending 05/01/22 1147 Filed Weights   04/30/22 1244 05/01/22 0701  Weight: 72.1 kg 64.4 kg   Weight change:  Body mass index is 23.63 kg/m.   Physical Exam: General exam: Pleasant elderly Caucasian female. Skin: No rashes, lesions or ulcers. HEENT: Atraumatic, normocephalic, no obvious bleeding Lungs: Clear to auscultation bilaterally CVS: Regular rate and rhythm, no murmur GI/Abd soft, nontender, nondistended, bowel sound present CNS: Alert, awake, oriented to place and person, falls asleep in the middle of conversation Psychiatry: Mood appropriate Extremities: Trace bilateral pedal edema, no calf tenderness  Data Review: I have personally reviewed the laboratory data and studies available.  F/u labs ordered Unresulted Labs (From admission, onward)     Start     Ordered   05/02/22 0500  CBC with Differential/Platelet  Tomorrow morning,   R        05/01/22 0858   05/02/22 0500  Comprehensive metabolic panel  Tomorrow morning,   R        05/01/22 0858   05/02/22 0500  Ammonia  Tomorrow morning,   R        05/01/22 0858   04/30/22 1248  Blood Culture (routine  x 2)  (Undifferentiated presentation (screening labs and basic nursing orders))  BLOOD CULTURE X 2,   STAT      04/30/22 1248   04/30/22 1248  Urinalysis, Routine w reflex microscopic  (Undifferentiated presentation (screening labs and basic nursing orders))  ONCE - URGENT,   URGENT        04/30/22 1248   04/30/22 1248  Urine Culture  (Undifferentiated presentation (screening labs and basic nursing orders))  ONCE - URGENT,   URGENT       Question:  Indication  Answer:  Sepsis   04/30/22 1248            Signed, Terrilee Croak, MD [facility] 05/01/2022

## 2022-05-01 NOTE — Telephone Encounter (Signed)
Pt has been admitted. Will f/u after hospitalization

## 2022-05-02 ENCOUNTER — Inpatient Hospital Stay (HOSPITAL_COMMUNITY): Payer: Medicare Other

## 2022-05-02 DIAGNOSIS — K7682 Hepatic encephalopathy: Secondary | ICD-10-CM | POA: Diagnosis not present

## 2022-05-02 LAB — CBC WITH DIFFERENTIAL/PLATELET
Abs Immature Granulocytes: 0.01 10*3/uL (ref 0.00–0.07)
Basophils Absolute: 0 10*3/uL (ref 0.0–0.1)
Basophils Relative: 0 %
Eosinophils Absolute: 0.1 10*3/uL (ref 0.0–0.5)
Eosinophils Relative: 4 %
HCT: 38.8 % (ref 36.0–46.0)
Hemoglobin: 12.7 g/dL (ref 12.0–15.0)
Immature Granulocytes: 0 %
Lymphocytes Relative: 26 %
Lymphs Abs: 0.9 10*3/uL (ref 0.7–4.0)
MCH: 34.6 pg — ABNORMAL HIGH (ref 26.0–34.0)
MCHC: 32.7 g/dL (ref 30.0–36.0)
MCV: 105.7 fL — ABNORMAL HIGH (ref 80.0–100.0)
Monocytes Absolute: 0.4 10*3/uL (ref 0.1–1.0)
Monocytes Relative: 12 %
Neutro Abs: 1.9 10*3/uL (ref 1.7–7.7)
Neutrophils Relative %: 58 %
Platelets: 102 10*3/uL — ABNORMAL LOW (ref 150–400)
RBC: 3.67 MIL/uL — ABNORMAL LOW (ref 3.87–5.11)
RDW: 17.7 % — ABNORMAL HIGH (ref 11.5–15.5)
WBC: 3.3 10*3/uL — ABNORMAL LOW (ref 4.0–10.5)
nRBC: 0 % (ref 0.0–0.2)

## 2022-05-02 LAB — COMPREHENSIVE METABOLIC PANEL
ALT: 72 U/L — ABNORMAL HIGH (ref 0–44)
AST: 90 U/L — ABNORMAL HIGH (ref 15–41)
Albumin: 2.9 g/dL — ABNORMAL LOW (ref 3.5–5.0)
Alkaline Phosphatase: 159 U/L — ABNORMAL HIGH (ref 38–126)
Anion gap: <3 — ABNORMAL LOW (ref 5–15)
BUN: 40 mg/dL — ABNORMAL HIGH (ref 8–23)
CO2: 29 mmol/L (ref 22–32)
Calcium: 9.3 mg/dL (ref 8.9–10.3)
Chloride: 113 mmol/L — ABNORMAL HIGH (ref 98–111)
Creatinine, Ser: 1.25 mg/dL — ABNORMAL HIGH (ref 0.44–1.00)
GFR, Estimated: 46 mL/min — ABNORMAL LOW (ref >60)
Glucose, Bld: 84 mg/dL (ref 70–99)
Potassium: 4.6 mmol/L (ref 3.5–5.1)
Sodium: 144 mmol/L (ref 135–145)
Total Bilirubin: 2.3 mg/dL — ABNORMAL HIGH (ref 0.3–1.2)
Total Protein: 6.7 g/dL (ref 6.5–8.1)

## 2022-05-02 LAB — AMMONIA: Ammonia: 37 umol/L — ABNORMAL HIGH (ref 9–35)

## 2022-05-02 LAB — LACTIC ACID, PLASMA
Lactic Acid, Venous: 2 mmol/L (ref 0.5–1.9)
Lactic Acid, Venous: 1.4 mmol/L (ref 0.5–1.9)

## 2022-05-02 MED ORDER — LACTULOSE ENEMA
300.0000 mL | Freq: Once | RECTAL | Status: DC
Start: 2022-05-02 — End: 2022-05-02
  Filled 2022-05-02: qty 300

## 2022-05-02 MED ORDER — LACTATED RINGERS IV BOLUS
1000.0000 mL | Freq: Once | INTRAVENOUS | Status: AC
  Administered 2022-05-02: 1000 mL via INTRAVENOUS

## 2022-05-02 MED ORDER — CHLORHEXIDINE GLUCONATE 0.12 % MT SOLN
15.0000 mL | Freq: Two times a day (BID) | OROMUCOSAL | Status: DC
  Administered 2022-05-02 – 2022-05-05 (×6): 15 mL via OROMUCOSAL
  Filled 2022-05-02 (×6): qty 15

## 2022-05-02 MED ORDER — ORAL CARE MOUTH RINSE
15.0000 mL | Freq: Two times a day (BID) | OROMUCOSAL | Status: DC
  Administered 2022-05-02 – 2022-05-05 (×7): 15 mL via OROMUCOSAL

## 2022-05-02 MED ORDER — CHLORHEXIDINE GLUCONATE CLOTH 2 % EX PADS
6.0000 | MEDICATED_PAD | Freq: Every day | CUTANEOUS | Status: DC
  Administered 2022-05-02 – 2022-05-05 (×4): 6 via TOPICAL

## 2022-05-02 MED ORDER — DEXTROSE IN LACTATED RINGERS 5 % IV SOLN: INTRAVENOUS | Status: AC

## 2022-05-02 MED ORDER — LACTULOSE ENEMA
300.0000 mL | Freq: Once | ORAL | Status: AC
  Administered 2022-05-02: 300 mL via RECTAL
  Filled 2022-05-02: qty 300

## 2022-05-02 MED ORDER — SODIUM CHLORIDE 0.9 % IV SOLN
1.0000 g | INTRAVENOUS | Status: DC
  Administered 2022-05-02 – 2022-05-04 (×3): 1 g via INTRAVENOUS
  Filled 2022-05-02 (×5): qty 10

## 2022-05-02 NOTE — Evaluation (Signed)
Physical Therapy Evaluation Patient Details Name: [name] MRN: [id number] DOB: 03-01-1951 Today's Date: 05/02/2022  History of Present Illness  71 y.o. female with medical history significant of colon cancer, cirrhosis secondary to chemo, portal hypertension, gastric varices, hypothyroidism, chronic pain, hypertension presented with altered mental status and cough,ammonia was 45.  UA was suggestive of UTI.  Chest x-ray showed cardiomegaly and probable small left effusion.  Clinical Impression  Pt presented to ED from home and has mobility and balance deficits due to above HPI. 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 Currently she requires Min guard assist for bed mobility, transfers, and gait. She is limited by nausea and overall weakness. Pt ambulated 10 ft to bathroom. Recommending continued skilled therapy. Will progress as able.      Recommendations for follow up therapy are one component of a multi-disciplinary discharge planning process, led by the attending physician.  Recommendations may be updated based on patient status, additional functional criteria and insurance authorization.  Follow Up Recommendations Follow physician's recommendations for discharge plan and follow up therapies    Assistance Recommended at Discharge Frequent or constant Supervision/Assistance  Patient can return home with the following  A little help with walking and/or transfers;A little help with bathing/dressing/bathroom;Assistance with cooking/housework;Direct supervision/assist for medications management;Direct supervision/assist for financial management;Assist for transportation;Help with stairs or ramp for entrance    Equipment Recommendations None recommended by PT  Recommendations for Other Services       Functional Status Assessment Patient has had a recent decline in their functional status and demonstrates the ability to make significant improvements in function  in a reasonable and predictable amount of time.     Precautions / Restrictions Precautions Precautions: Fall Restrictions Weight Bearing Restrictions: No      Mobility  Bed Mobility Overal bed mobility: Needs Assistance Bed Mobility: Supine to Sit     Supine to sit: Min guard     General bed mobility comments: Pt relied on constant VC"s for sitting to EOB, Able to push her trunk forward and swing legs to floor.    Transfers Overall transfer level: Needs assistance Equipment used: Rolling walker (2 wheels) Transfers: Sit to/from Stand Sit to Stand: Min guard           General transfer comment: Pt needed VC's and used RW for support.    Ambulation/Gait Ambulation/Gait assistance: Min guard Gait Distance (Feet): 12 Feet Assistive device: Rolling walker (2 wheels) Gait Pattern/deviations: Step-through pattern, Decreased step length - right, Decreased step length - left, Decreased stride length, Narrow base of support Gait velocity: decr     General Gait Details: Pt ambulated to toliet needing min guard for VC's for pathway direction and RW placement.  Stairs            Wheelchair Mobility    Modified Rankin (Stroke Patients Only)       Balance Overall balance assessment: Needs assistance Sitting-balance support: Bilateral upper extremity supported Sitting balance-Leahy Scale: Fair     Standing balance support: Bilateral upper extremity supported, During functional activity, Reliant on assistive device for balance Standing balance-Leahy Scale: Fair                               Pertinent Vitals/Pain Pain Assessment Pain Assessment: Faces Faces Pain Scale: Hurts a little bit Pain Location: Generalized pain/discomfort Pain Descriptors / Indicators: Discomfort (Feeling sick/nauseated) Pain Intervention(s): Repositioned, Limited activity  within patient's tolerance, Monitored during session    Home Living Family/patient expects to be  discharged to:: Private residence Living Arrangements: Spouse/significant other Available Help at Discharge: Family Type of Home: House Home Access: [place]: One level        Prior Function Prior Level of Function : Independent/Modified Independent             Mobility Comments: Librarian at a school, 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       Hand Dominance        Extremity/Trunk Assessment   Upper Extremity Assessment Upper Extremity Assessment: Defer to OT evaluation    Lower Extremity Assessment Lower Extremity Assessment: Generalized weakness    Cervical / Trunk Assessment Cervical / Trunk Assessment: Normal  Communication   Communication: No difficulties  Cognition Arousal/Alertness: Awake/alert Behavior During Therapy: WFL for tasks assessed/performed Overall Cognitive Status: Within Functional Limits for tasks assessed                                          General Comments      Exercises     Assessment/Plan    PT Assessment    PT Problem List         PT Treatment Interventions      PT Goals (Current goals can be found in the Care Plan section)  Acute Rehab PT Goals Patient Stated Goal: To go home. PT Goal Formulation: With patient/family Time For Goal Achievement: 05/16/22 Potential to Achieve Goals: Good    Frequency Min 3X/week     Co-evaluation               AM-PAC PT "6 Clicks" Mobility  Outcome Measure Help needed turning from your back to your side while in a flat bed without using bedrails?: None Help needed moving from lying on your back to sitting on the side of a flat bed without using bedrails?: A Little Help needed moving to and from a bed to a chair (including a wheelchair)?: A Little Help needed standing up from a chair using your arms (e.g., wheelchair or bedside chair)?: A Little Help needed to walk in hospital room?: A Little Help needed climbing 3-5 steps with  a railing? : A Little 6 Click Score: 19    End of Session Equipment Utilized During Treatment: Gait belt Activity Tolerance: Patient tolerated treatment well Patient left: in chair;with call bell/phone within reach;with chair alarm set;with family/visitor present Nurse Communication: Mobility status PT Visit Diagnosis: Unsteadiness on feet (R26.81);Repeated falls (R29.6);Muscle weakness (generalized) (M62.81)    Time: 9892-1194 PT Time Calculation (min) (ACUTE ONLY): 31 min   Charges:   PT Evaluation $PT Eval Moderate Complexity: 1 Mod PT Treatments $Therapeutic Activity: 8-22 mins        Margie Ege, SPT [place] 05/02/2022, 3:43 PM

## 2022-05-02 NOTE — Progress Notes (Signed)
PROGRESS NOTE    [name]  WEX:[id number] DOB: 05-14-1951 DOA: 04/30/2022 PCP: Lesleigh Noe, MD     Brief Narrative:    cirrhosis of liver, hypothyroidism, stage 3b cecal adenocarcinoma s/p surgery. Presenting with altered mental status and weakness.  Subjective:  She has been npo since admission  Vomited once last night  Appear very weak,  Denies pain, denies urinary symptom,  No fever  Blood culture +  Husband at bedside   Assessment & Plan:  Principal Problem:   Hepatic encephalopathy (HCC) Active Problems:   Decompensated liver cirrhosis with portal HTN and gastric varices   Portal hypertension (HCC)   Hypothyroidism   S/P TIPS (transjugular intrahepatic portosystemic shunt)   AKI (acute kidney injury) ([place])   Hyperbilirubinemia   Hyperammonemia (HCC)   Acute encephalopathy Metabolic encephalopathy/ Hepatic encephalopathy Not able to keep oral lactulose due to nausea vomiting, ammonia level improving but mental status does not improve Need to rule out infection  Nausea vomiting Reports sudden onset from Tuesday Check KUB  AKI Has been n.p.o. since admission, appear dry, no ascites on abdominal ultrasound ,start hydration  UA concerning for UTI , urine culture obtained this morning in process In out cath drained 400 cc, placed indwelling Foley catheter on 610 Start Rocephin  Bacteremia? Questionable source Repeat blood culture  Empirically started on Rocephin    Cirrhosis of liver Portal HTN s/p TIPS -On Lasix and Aldactone at home.  Currently on hold because of AKI. Ab US showed gallstone, no sign of cholecystitis, no ascites reported -Follows up with GI [facility] at [facility]    Macrocytosis MCV 105.7 Check B12  Nutritional Assessment: The patient's BMI is: Body mass index is 23.63 kg/m.[place] Seen by dietician.  I agree with the assessment and plan as outlined below: Nutrition Status: Nutrition Problem: Inadequate oral  intake Etiology: acute illness, decreased appetite, early satiety Signs/Symptoms: per patient/family report Interventions: Refer to RD note for recommendations  .      I have Reviewed nursing notes, Vitals, pain scores, I/o's, Lab results and  imaging results since pt's last encounter, details please see discussion above  I ordered the following labs:  Unresulted Labs (From admission, onward)     Start     Ordered   05/03/22 0500  Vitamin B12  Tomorrow morning,   R        05/02/22 0837   05/03/22 0500  CBC with Differential/Platelet  Tomorrow morning,   R        05/02/22 1652   05/03/22 0500  Comprehensive metabolic panel  Tomorrow morning,   R        05/02/22 1652   05/03/22 0500  Magnesium  Tomorrow morning,   R        05/02/22 1652   05/03/22 0500  Phosphorus  Tomorrow morning,   R        05/02/22 1652   05/03/22 0500  Ammonia  Tomorrow morning,   R        05/02/22 1652   05/02/22 1514  Lactic acid, plasma  STAT Now then every 3 hours,   R (with STAT occurrences)      05/02/22 1513   05/02/22 1509  Culture, blood (Routine X 2) w Reflex to ID Panel  BLOOD CULTURE X 2,   R (with TIMED occurrences)      05/02/22 1508             DVT prophylaxis: SCDs Start: 05/01/22 9381  Code Status:   Code Status: Full Code  Family Communication: Husband at bedside Disposition:    Dispo: The patient is from: home , 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               Anticipated d/c is to: Clinically appear getting worse, changed to progressive bed, may need to transfer to stepdown if continue get worse               Antimicrobials:    Anti-infectives (From admission, onward)    Start     Dose/Rate Route Frequency Ordered Stop   05/03/22 0800  vancomycin (VANCOREADY) IVPB 1250 mg/250 mL        1,250 mg 166.7 mL/hr over 90 Minutes Intravenous Every 36 hours 05/01/22 1757     05/02/22 1615  cefTRIAXone (ROCEPHIN) 1 g in sodium chloride 0.9 % 100 mL IVPB         1 g 200 mL/hr over 30 Minutes Intravenous Every 24 hours 05/02/22 1521     05/01/22 1745  vancomycin (VANCOREADY) IVPB 1500 mg/300 mL        1,500 mg 150 mL/hr over 120 Minutes Intravenous  Once 05/01/22 1645 05/01/22 2316          Objective: Vitals:   05/02/22 1455 05/02/22 1456 05/02/22 1500 05/02/22 1520  BP:   (!) 155/76   Pulse:   69   Resp:   15   Temp: (!) 94.1 F (34.5 C) (!) 94.3 F (34.6 C) (!) 94.3 F (34.6 C) (!) 94.5 F (34.7 C)  TempSrc: Axillary Oral Oral Axillary  SpO2:      Weight:      Height:        Intake/Output Summary (Last 24 hours) at 05/02/2022 1658 Last data filed at 05/02/2022 1400 Gross per 24 hour  Intake 14.77 ml  Output 200 ml  Net -185.23 ml   Filed Weights   04/30/22 1244 05/01/22 0701  Weight: 72.1 kg 64.4 kg    Examination:  General exam: Very weak, very drowsy, maintaining airway, following commands Respiratory system: Clear to auscultation. Respiratory effort normal. Cardiovascular system:  RRR.  Gastrointestinal system: Abdomen is nondistended, soft and nontender.  Normal bowel sounds heard. Central nervous system: very drowsy, No focal neurological deficits. Extremities:  no edema Skin: No rashes, lesions or ulcers Psychiatry: Drowsy, no agitation.     Data Reviewed: I have personally reviewed  labs and visualized  imaging studies since the last encounter and formulate the plan        Scheduled Meds:  chlorhexidine  15 mL Mouth Rinse BID   lactulose  300 mL Rectal Daily   levothyroxine  75 mcg Oral QAC breakfast   mouth rinse  15 mL Mouth Rinse q12n4p   Continuous Infusions:  cefTRIAXone (ROCEPHIN)  IV     dextrose 5% lactated ringers     [START ON 05/03/2022] vancomycin       LOS: 2 days     Florencia Reasons, MD PhD FACP [facility]  Available via Epic secure chat 7am-7pm for nonurgent issues Please page for urgent issues To page the attending provider between 7A-7P or the covering provider during  after hours 7P-7A, please log into the web site [email or web address] and access using universal [place] password for that web site. If you do not have the password, please call the hospital operator.    05/02/2022, 4:58 PM

## 2022-05-02 NOTE — Progress Notes (Signed)
When given bedside shift report to dayshift nurse on patient, patient's husband mentioned that patient had thrown up sometime after patient had taken morning synthroid. Patient's emesis was clear. Patient had only vomited that one time during the night shift.

## 2022-05-02 NOTE — Plan of Care (Signed)

## 2022-05-03 DIAGNOSIS — K7682 Hepatic encephalopathy: Secondary | ICD-10-CM | POA: Diagnosis not present

## 2022-05-03 LAB — COMPREHENSIVE METABOLIC PANEL
ALT: 59 U/L — ABNORMAL HIGH (ref 0–44)
AST: 72 U/L — ABNORMAL HIGH (ref 15–41)
Albumin: 2.8 g/dL — ABNORMAL LOW (ref 3.5–5.0)
Alkaline Phosphatase: 145 U/L — ABNORMAL HIGH (ref 38–126)
Anion gap: 7 (ref 5–15)
BUN: 38 mg/dL — ABNORMAL HIGH (ref 8–23)
CO2: 25 mmol/L (ref 22–32)
Calcium: 9.1 mg/dL (ref 8.9–10.3)
Chloride: 116 mmol/L — ABNORMAL HIGH (ref 98–111)
Creatinine, Ser: 1.08 mg/dL — ABNORMAL HIGH (ref 0.44–1.00)
GFR, Estimated: 55 mL/min — ABNORMAL LOW (ref >60)
Glucose, Bld: 114 mg/dL — ABNORMAL HIGH (ref 70–99)
Potassium: 4.3 mmol/L (ref 3.5–5.1)
Sodium: 148 mmol/L — ABNORMAL HIGH (ref 135–145)
Total Bilirubin: 1.6 mg/dL — ABNORMAL HIGH (ref 0.3–1.2)
Total Protein: 6.4 g/dL — ABNORMAL LOW (ref 6.5–8.1)

## 2022-05-03 LAB — CBC WITH DIFFERENTIAL/PLATELET
Abs Immature Granulocytes: 0.01 10*3/uL (ref 0.00–0.07)
Basophils Absolute: 0 10*3/uL (ref 0.0–0.1)
Basophils Relative: 0 %
Eosinophils Absolute: 0 10*3/uL (ref 0.0–0.5)
Eosinophils Relative: 1 %
HCT: 36.4 % (ref 36.0–46.0)
Hemoglobin: 11.9 g/dL — ABNORMAL LOW (ref 12.0–15.0)
Immature Granulocytes: 0 %
Lymphocytes Relative: 14 %
Lymphs Abs: 0.6 10*3/uL — ABNORMAL LOW (ref 0.7–4.0)
MCH: 34.1 pg — ABNORMAL HIGH (ref 26.0–34.0)
MCHC: 32.7 g/dL (ref 30.0–36.0)
MCV: 104.3 fL — ABNORMAL HIGH (ref 80.0–100.0)
Monocytes Absolute: 0.5 10*3/uL (ref 0.1–1.0)
Monocytes Relative: 11 %
Neutro Abs: 3.3 10*3/uL (ref 1.7–7.7)
Neutrophils Relative %: 74 %
Platelets: 121 10*3/uL — ABNORMAL LOW (ref 150–400)
RBC: 3.49 MIL/uL — ABNORMAL LOW (ref 3.87–5.11)
RDW: 17.6 % — ABNORMAL HIGH (ref 11.5–15.5)
WBC: 4.4 10*3/uL (ref 4.0–10.5)
nRBC: 0 % (ref 0.0–0.2)

## 2022-05-03 LAB — PHOSPHORUS: Phosphorus: 2.2 mg/dL — ABNORMAL LOW (ref 2.5–4.6)

## 2022-05-03 LAB — AMMONIA: Ammonia: 69 umol/L — ABNORMAL HIGH (ref 9–35)

## 2022-05-03 LAB — MAGNESIUM: Magnesium: 1.8 mg/dL (ref 1.7–2.4)

## 2022-05-03 LAB — VITAMIN B12: Vitamin B-12: 7440 pg/mL — ABNORMAL HIGH (ref 180–914)

## 2022-05-03 MED ORDER — LACTULOSE 10 GM/15ML PO SOLN
40.0000 g | Freq: Three times a day (TID) | ORAL | Status: DC
  Administered 2022-05-03 – 2022-05-05 (×6): 40 g via ORAL
  Filled 2022-05-03 (×5): qty 60

## 2022-05-03 MED ORDER — VANCOMYCIN HCL IN DEXTROSE 1-5 GM/200ML-% IV SOLN
1000.0000 mg | INTRAVENOUS | Status: DC
Start: 2022-05-04 — End: 2022-05-04
  Administered 2022-05-04: 1000 mg via INTRAVENOUS
  Filled 2022-05-03: qty 200

## 2022-05-03 MED ORDER — LACTATED RINGERS IV SOLN: INTRAVENOUS | Status: DC

## 2022-05-03 MED ORDER — SODIUM PHOSPHATES 45 MMOLE/15ML IV SOLN
15.0000 mmol | Freq: Once | INTRAVENOUS | Status: AC
  Administered 2022-05-03: 15 mmol via INTRAVENOUS
  Filled 2022-05-03: qty 5

## 2022-05-03 MED ORDER — LACTULOSE ENEMA
300.0000 mL | Freq: Every day | RECTAL | Status: AC
Start: 2022-05-04 — End: 2022-05-05
  Filled 2022-05-03: qty 300

## 2022-05-03 NOTE — Progress Notes (Signed)
Pharmacy Antibiotic Note  [name] is a 71 y.o. female admitted on 04/30/2022 with bacteremia.  Pharmacy has been consulted for Vanco dosing.  ID: Bacteremia of unknown source - Temp 94.1 now 98.1, WBC WNL/low, Scr 1 - LA 2>1.4  Antimicrobials this admission: 6/9 vancomycin >>  6/10: Rocephin>>  Microbiology results: 6/8 BCx x 2: Staph Epi both bottles 6/8: BC x 2: Staph Capitis both bottles 6/8 BCID Staph species, no resistance 6/9 UCx: IP  Dose changes Vancomycin 1.5g x 1 then '1250mg'$  IV q36h (eAUC 503.7, Scr 1.25), 6/11: Change to Vanco 1g/24h (Auc 495, Scr 1)  Plan: Change to Vanco 1g/24h (Auc 495, Scr 1)    Height: '5\' 5"'$  (165.1 cm) Weight: 64.4 kg (141 lb 15.6 oz) IBW/kg (Calculated) : 57  Temp (24hrs), Avg:96 F (35.6 C), Min:94.1 F (34.5 C), Max:98.4 F (36.9 C)  Recent Labs  Lab 04/30/22 0300 04/30/22 1251 04/30/22 1340 05/01/22 0715 05/02/22 0451 05/02/22 1616 05/02/22 1835 05/03/22 0432  WBC  --  4.8  --  7.6 3.3*  --   --  4.4  CREATININE  --  1.38*  --  1.25* 1.25*  --   --  1.08*  LATICACIDVEN 1.0  --  1.4  --   --  2.0* 1.4  --     Estimated Creatinine Clearance: 43.6 mL/min (A) (by C-G formula based on SCr of 1.08 mg/dL (H)).    Allergies  Allergen Reactions   Contrast Media [Iodinated Contrast Media] Hives    [name] S. Alford Highland, PharmD, BCPS Clinical Staff Pharmacist [email or web address]  Wayland Salinas 05/03/2022 10:51 AM

## 2022-05-03 NOTE — Plan of Care (Signed)

## 2022-05-03 NOTE — Progress Notes (Signed)
Pt tolerating clear liquid diet, oral lactulose without nausea/vomiting.  Husband at bedside expresses anxiety over patient's condition and inability to have a large BM.  Per charting, pt has had 3 BM's since admission including one from today's enema by this RN.  Pt family provided with thorough education and emotional support.  Pt's mentation slowly improving throughout the day, able to speak, spontaneously interacts vs. this AM where pt was Aox1, non-interactive, only responsive to stimuli. However, pt remains drowsy and frequently drifts to sleep.

## 2022-05-03 NOTE — Progress Notes (Signed)
PROGRESS NOTE    [name]  QHU:[id number] DOB: 13-Nov-1951 DOA: 04/30/2022 PCP: Lesleigh Noe, MD     Brief Narrative:    cirrhosis of liver, hypothyroidism, stage 3b cecal adenocarcinoma s/p surgery. Presenting with altered mental status and weakness.  Subjective:   still very weak, lethargic, but oriented x3 Denies pain, no sob No vomiting since yesterday am Desires to start some clears by mouth today  No fever  Husband at bedside   Assessment & Plan:  Principal Problem:   Hepatic encephalopathy ([place]) Active Problems:   Decompensated liver cirrhosis with portal HTN and gastric varices   Portal hypertension (HCC)   Hypothyroidism   S/P TIPS (transjugular intrahepatic portosystemic shunt)   AKI (acute kidney injury) ([place])   Hyperbilirubinemia   Hyperammonemia (HCC)   Acute encephalopathy from  Hepatic encephalopathy and encephalopathy from infection Reportedly Not able to keep oral lactulose due to nausea vomiting at home,  ammonia level improving but mental status does not improve, continue rectal lactulose, add oral lactulose if able to tolerate , nay need ng for lactulose if not improving by 6/12, pt and family would like to avoid it for now Found to have UTI, also have positive blood cultures -discussed with patient and husband, may need NG for meds and nutrition if she remains encephalopathy on 6/12, they expressed understanding    Nausea vomiting Reports sudden onset from Tuesday KUB + stool , no obstruction Last vomited on 6/10am, desires to try clears today on 6/11  AKI Has been n.p.o. since admission, appear dry, no ascites on abdominal ultrasound ,start hydration  UTI , urine culture + ecoli, started on  rocephin Acute urinary retention In out cath drained 400 cc, placed indwelling Foley catheter on 6/10 Need voiding trial prior to discharge  Bacteremia? Questionable source Repeat blood culture  Empirically started on vanc    Cirrhosis of  liver Portal HTN s/p TIPS -On Lasix and Aldactone at home.  Currently on hold because of AKI. -Ab US showed gallstone, no sign of cholecystitis, no ascites reported -Follows up with GI NP Dawn Drozik at [facility]    Macrocytosis MCV 105.7 B12 abnormally high likely from liver disease   Nutritional Assessment: The patient's BMI is: Body mass index is 23.63 kg/m.Marland Kitchen Seen by dietician.  I agree with the assessment and plan as outlined below: Nutrition Status: Nutrition Problem: Inadequate oral intake Etiology: acute illness, decreased appetite, early satiety Signs/Symptoms: per patient/family report Interventions: Refer to RD note for recommendations  .      I have Reviewed nursing notes, Vitals, pain scores, I/o's, Lab results and  imaging results since pt's last encounter, details please see discussion above  I ordered the following labs:  Unresulted Labs (From admission, onward)     Start     Ordered   05/04/22 0500  CBC  Tomorrow morning,   R        05/03/22 1245   05/04/22 4656  Basic metabolic panel  Tomorrow morning,   R        05/03/22 1245   05/02/22 1509  Culture, blood (Routine X 2) w Reflex to ID Panel  BLOOD CULTURE X 2,   R (with TIMED occurrences)      05/02/22 1508             DVT prophylaxis: SCDs Start: 05/01/22 0256   Code Status:   Code Status: Full Code  Family Communication: Husband at bedside daily  Disposition:    Dispo:  The patient is from: home , 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               Anticipated d/c is to: f/u on blood and urine culture, need to advance diet , need to do voiding trial If failed oral challenge or still too lethargic, may need ng, patient and husband would like to avoid it for now               Antimicrobials:    Anti-infectives (From admission, onward)    Start     Dose/Rate Route Frequency Ordered Stop   05/04/22 0930  vancomycin (VANCOCIN) IVPB 1000 mg/200 mL premix        1,000 mg 200  mL/hr over 60 Minutes Intravenous Every 24 hours 05/03/22 1051     05/03/22 0800  vancomycin (VANCOREADY) IVPB 1250 mg/250 mL  Status:  Discontinued        1,250 mg 166.7 mL/hr over 90 Minutes Intravenous Every 36 hours 05/01/22 1757 05/03/22 1051   05/02/22 1615  cefTRIAXone (ROCEPHIN) 1 g in sodium chloride 0.9 % 100 mL IVPB        1 g 200 mL/hr over 30 Minutes Intravenous Every 24 hours 05/02/22 1521     05/01/22 1745  vancomycin (VANCOREADY) IVPB 1500 mg/300 mL        1,500 mg 150 mL/hr over 120 Minutes Intravenous  Once 05/01/22 1645 05/01/22 2316          Objective: Vitals:   05/02/22 1831 05/02/22 2008 05/03/22 0024 05/03/22 0413  BP: 129/65 (!) 123/56 (!) 117/56 (!) 124/54  Pulse: 81 93 (!) 108 97  Resp: '13 14 20 18  '$ Temp: 97.8 F (36.6 C) 98.4 F (36.9 C) (!) 96.9 F (36.1 C) 98.1 F (36.7 C)  TempSrc: Oral Oral Oral Oral  SpO2: 96% 92% 91% 91%  Weight:      Height:        Intake/Output Summary (Last 24 hours) at 05/03/2022 1247 Last data filed at 05/03/2022 0153 Gross per 24 hour  Intake 1680.14 ml  Output 650 ml  Net 1030.14 ml   Filed Weights   04/30/22 1244 05/01/22 0701  Weight: 72.1 kg 64.4 kg    Examination:  General exam: Very weak, very drowsy, maintaining airway, oriented x3, following commands Respiratory system: Clear to auscultation. Respiratory effort normal. Cardiovascular system:  RRR.  Gastrointestinal system: Abdomen is nondistended, soft and nontender.  Normal bowel sounds heard. Central nervous system: very drowsy, No focal neurological deficits. Extremities:  no edema Skin: No rashes, lesions or ulcers Psychiatry: Drowsy, no agitation.     Data Reviewed: I have personally reviewed  labs and visualized  imaging studies since the last encounter and formulate the plan        Scheduled Meds:  chlorhexidine  15 mL Mouth Rinse BID   Chlorhexidine Gluconate Cloth  6 each Topical Daily   lactulose  40 g Oral TID   lactulose   300 mL Rectal Daily   levothyroxine  75 mcg Oral QAC breakfast   mouth rinse  15 mL Mouth Rinse q12n4p   Continuous Infusions:  cefTRIAXone (ROCEPHIN)  IV Stopped (05/02/22 1814)   dextrose 5% lactated ringers 75 mL/hr at 05/03/22 0153   sodium phosphate 15 mmol in dextrose 5 % 250 mL infusion     [START ON 05/04/2022] vancomycin       LOS: 3 days     Florencia Reasons, MD PhD Rosalita Chessman  [facility]  Available via Epic secure chat 7am-7pm for nonurgent issues Please page for urgent issues To page the attending provider between 7A-7P or the covering provider during after hours 7P-7A, please log into the web site [email or web address] and access using universal [place] password for that web site. If you do not have the password, please call the hospital operator.    05/03/2022, 12:47 PM

## 2022-05-04 ENCOUNTER — Telehealth: Payer: Self-pay | Admitting: *Deleted

## 2022-05-04 ENCOUNTER — Telehealth: Payer: Self-pay | Admitting: Gastroenterology

## 2022-05-04 ENCOUNTER — Ambulatory Visit: Payer: Medicare Other | Admitting: Family Medicine

## 2022-05-04 DIAGNOSIS — R338 Other retention of urine: Secondary | ICD-10-CM

## 2022-05-04 DIAGNOSIS — K7682 Hepatic encephalopathy: Secondary | ICD-10-CM | POA: Diagnosis not present

## 2022-05-04 DIAGNOSIS — R7881 Bacteremia: Secondary | ICD-10-CM | POA: Diagnosis present

## 2022-05-04 DIAGNOSIS — E870 Hyperosmolality and hypernatremia: Secondary | ICD-10-CM

## 2022-05-04 HISTORY — DX: Other retention of urine: R33.8

## 2022-05-04 LAB — CBC
HCT: 36.4 % (ref 36.0–46.0)
Hemoglobin: 11.7 g/dL — ABNORMAL LOW (ref 12.0–15.0)
MCH: 34.2 pg — ABNORMAL HIGH (ref 26.0–34.0)
MCHC: 32.1 g/dL (ref 30.0–36.0)
MCV: 106.4 fL — ABNORMAL HIGH (ref 80.0–100.0)
Platelets: 96 10*3/uL — ABNORMAL LOW (ref 150–400)
RBC: 3.42 MIL/uL — ABNORMAL LOW (ref 3.87–5.11)
RDW: 17.9 % — ABNORMAL HIGH (ref 11.5–15.5)
WBC: 3.6 10*3/uL — ABNORMAL LOW (ref 4.0–10.5)
nRBC: 0 % (ref 0.0–0.2)

## 2022-05-04 LAB — BASIC METABOLIC PANEL
Anion gap: 5 (ref 5–15)
BUN: 26 mg/dL — ABNORMAL HIGH (ref 8–23)
CO2: 26 mmol/L (ref 22–32)
Calcium: 9 mg/dL (ref 8.9–10.3)
Chloride: 117 mmol/L — ABNORMAL HIGH (ref 98–111)
Creatinine, Ser: 0.84 mg/dL (ref 0.44–1.00)
GFR, Estimated: >60 mL/min (ref >60)
Glucose, Bld: 91 mg/dL (ref 70–99)
Potassium: 3.7 mmol/L (ref 3.5–5.1)
Sodium: 148 mmol/L — ABNORMAL HIGH (ref 135–145)

## 2022-05-04 LAB — AMMONIA: Ammonia: 82 umol/L — ABNORMAL HIGH (ref 9–35)

## 2022-05-04 LAB — PHOSPHORUS: Phosphorus: 2.7 mg/dL (ref 2.5–4.6)

## 2022-05-04 LAB — MAGNESIUM: Magnesium: 1.7 mg/dL (ref 1.7–2.4)

## 2022-05-04 MED ORDER — DEXTROSE 5 % IV SOLN: INTRAVENOUS | Status: AC

## 2022-05-04 NOTE — Assessment & Plan Note (Signed)
S/p TIPS.  Patient was on Lasix and spironolactone at home which is being held during current hospitalization due to AKI. -Continue to monitor

## 2022-05-04 NOTE — Care Management Important Message (Signed)
Important Message  Patient Details IM Letter given to the Patient. Name: [name] MRN: [id number] Date of Birth: [birth year]   Medicare Important Message Given:  Yes     Kerin Salen 05/04/2022, 10:40 AM

## 2022-05-04 NOTE — Assessment & Plan Note (Signed)
Patient follow-up with hepatology as an outpatient. History of TIPS procedure many years ago. Mental status improving. -Continue with lactulose

## 2022-05-04 NOTE — Telephone Encounter (Signed)
Noted will see after hospitalization

## 2022-05-04 NOTE — Progress Notes (Signed)
  Progress Note   Patient: [name] WCH:[id number] DOB: 11-03-1951 DOA: 04/30/2022     4 DOS: the patient was seen and examined on 05/04/2022   Brief hospital course: No notes on file  Assessment and Plan: * Hepatic encephalopathy ([place]) Presented with hepatic encephalopathy, unable to take oral lactulose at home due to nausea and vomiting.  Ammonia level initially improved to 22 and then started trending up. Significant improvement in mental status today. -Continue with lactulose. -Patient will follow-up with her hepatologist after discharge  Decompensated liver cirrhosis with portal HTN and gastric varices Patient follow-up with hepatology as an outpatient. History of TIPS procedure many years ago. Mental status improving. -Continue with lactulose  Hypernatremia Mild hypernatremia most likely secondary to dehydration. -Switch LR with D5 -Monitor sodium  Portal hypertension (HCC) S/p TIPS.  Patient was on Lasix and spironolactone at home which is being held during current hospitalization due to AKI. -Continue to monitor  AKI (acute kidney injury) ([place]) Resolved. Patient appears dry on admission.  Creatinine at baseline now -Monitor renal function -Avoid nephrotoxins  UTI (urinary tract infection) Urine culture with pansensitive E. coli.  Patient is on ceftriaxone. -Can switch ceftriaxone with Keflex on discharge.  Acute urinary retention Patient developed acute urinary retention most likely secondary to encephalopathy during current hospitalization requiring Foley catheter placement. -Discontinue Foley catheter to give her a voiding trial  Bacteremia Patient with questionable bacteremia, 1 blood culture positive for staph epi and second with Staph capitis.  She was started on vancomycin empirically. Most likely contaminant as repeat blood cultures remain negative. -Stop vancomycin and monitor  Hypothyroidism - Continue Synthroid  Hypertension Patient was on  Lasix and spironolactone at home.  Which are being held due to softer blood pressure.  Blood pressure within goal today. -Continue to monitor and we will restart home medications if blood pressure remains within normal limit    Subjective: Patient was more alert and awake today.  She was oriented x3.  Denies any more nausea or vomiting.  She was able to tolerate some diet today.  Husband at bedside.  Physical Exam: Vitals:   05/03/22 2214 05/04/22 0000 05/04/22 0431 05/04/22 1522  BP: 124/66  126/70 118/65  Pulse: 66  64 (!) 56  Resp: '17 13 17 20  '$ Temp: 97.7 F (36.5 C)  98.1 F (36.7 C)   TempSrc:      SpO2: 96%  94% 98%  Weight:      Height:       General.  Frail elderly lady, in no acute distress. Pulmonary.  Lungs clear bilaterally, normal respiratory effort. CV.  Regular rate and rhythm, no JVD, rub or murmur. Abdomen.  Soft, nontender, nondistended, BS positive. CNS.  Alert and oriented .  No focal neurologic deficit. Extremities.  No edema, no cyanosis, pulses intact and symmetrical. Psychiatry.  Judgment and insight appears normal.  Data Reviewed: Prior notes, labs and images reviewed  Family Communication: Discussed with husband at bedside  Disposition: Status is: Inpatient Remains inpatient appropriate because: Severity of illness   Planned Discharge Destination: Home  DVT prophylaxis.  SCDs Time spent: 50 minutes  This record has been created using Systems analyst. Errors have been sought and corrected,but may not always be located. Such creation errors do not reflect on the standard of care.  Author: Lorella Nimrod, MD 05/04/2022 6:36 PM  For on call review [email or web address].

## 2022-05-04 NOTE — Assessment & Plan Note (Signed)
Continue Synthroid °

## 2022-05-04 NOTE — Assessment & Plan Note (Signed)
Resolved. Patient appears dry on admission.  Creatinine at baseline now -Monitor renal function -Avoid nephrotoxins

## 2022-05-04 NOTE — Telephone Encounter (Signed)
PLEASE NOTE: All timestamps contained within this report are represented as [place] Standard Time. CONFIDENTIALTY NOTICE: This fax transmission is intended only for the addressee. It contains information that is legally privileged, confidential or otherwise protected from use or disclosure. If you are not the intended recipient, you are strictly prohibited from reviewing, disclosing, copying using or disseminating any of this information or taking any action in reliance on or regarding this information. If you have received this fax in error, please notify us immediately by telephone so that we can arrange for its return to [place]. Phone: [phone], Toll-Free: [phone], Fax: [phone] Page: 1 of 1 Call Id: [id number] [place] [facility] - Client Nonclinical Telephone Record  [facility] Client [place] Night - Client Client Site [place] Provider Waunita Schooner- MD Contact Type Call Who Is Calling Patient / Member / Family / Caregiver Caller Name Mr. Janece Canterbury Phone Number [phone] Patient Name [name] Patient DOB 1951/08/15 Call Type Message Only Information Provided Reason for Call Request to [place] Appointment Initial Comment Caller states his wife is supposed to have an appt with Dr. Einar Pheasant and would like to cancel the appt since his wife is currently in the hospital. Patient request to speak to RN No Disp. Time Disposition Final User 05/03/2022 1:34:42 PM General Information Provided Yes Ruben Im Call Closed By: Ruben Im Transaction Date/Time: 05/03/2022 1:29:59 PM (ET)

## 2022-05-04 NOTE — Assessment & Plan Note (Signed)
Presented with hepatic encephalopathy, unable to take oral lactulose at home due to nausea and vomiting.  Ammonia level initially improved to 22 and then started trending up. Significant improvement in mental status today. -Continue with lactulose. -Patient will follow-up with her hepatologist after discharge

## 2022-05-04 NOTE — Assessment & Plan Note (Signed)
Mild hypernatremia most likely secondary to dehydration. -Switch LR with D5 -Monitor sodium

## 2022-05-04 NOTE — Assessment & Plan Note (Signed)
Urine culture with pansensitive E. coli.  Patient is on ceftriaxone. -Can switch ceftriaxone with Keflex on discharge.

## 2022-05-04 NOTE — Progress Notes (Addendum)
Physical Therapy Treatment Patient Details Name: [name] MRN: [id number] DOB: [birth year] Today's Date: 05/04/2022   History of Present Illness 71 y.o. female with medical history significant of colon cancer, cirrhosis secondary to chemo, portal hypertension, gastric varices, hypothyroidism, chronic pain, hypertension presented with altered mental status and cough,ammonia was 45.  UA was suggestive of UTI.  Chest x-ray showed cardiomegaly and probable small left effusion.    PT Comments    Pt progressing toward PT goals.  Alert and interactive, husband present for session. Improving activity tol, may benefit from HHPT at d/c . VSS during PT session.  Recommendations for follow up therapy are one component of a multi-disciplinary discharge planning process, led by the attending physician.  Recommendations may be updated based on patient status, additional functional criteria and insurance authorization.  Follow Up Recommendations  Home health PT (pending progress)     Assistance Recommended at Discharge Intermittent Supervision/Assistance  Patient can return home with the following A little help with walking and/or transfers;A little help with bathing/dressing/bathroom;Assistance with cooking/housework;Direct supervision/assist for medications management;Direct supervision/assist for financial management;Assist for transportation;Help with stairs or ramp for entrance   Equipment Recommendations  None recommended by PT    Recommendations for Other Services       Precautions / Restrictions Precautions Precautions: Fall Restrictions Weight Bearing Restrictions: No     Mobility  Bed Mobility               General bed mobility comments: pt in recliner    Transfers Overall transfer level: Needs assistance Equipment used: Rolling walker (2 wheels) Transfers: Sit to/from Stand Sit to Stand: Min guard           General transfer comment: Pt needed VC's for correct  hand placement and overall safety. min/guard for safety with transition to RW    Ambulation/Gait Ambulation/Gait assistance: Min guard Gait Distance (Feet): 50 Feet Assistive device: Rolling walker (2 wheels) Gait Pattern/deviations: Step-through pattern, Decreased stride length, Narrow base of support Gait velocity: decr     General Gait Details: verbal cues for RW safety and direction   Stairs             Wheelchair Mobility    Modified Rankin (Stroke Patients Only)       Balance Overall balance assessment: Needs assistance Sitting-balance support: Bilateral upper extremity supported Sitting balance-Leahy Scale: Fair     Standing balance support: Bilateral upper extremity supported, During functional activity, Reliant on assistive device for balance Standing balance-Leahy Scale: Fair                              Cognition Arousal/Alertness: Awake/alert Behavior During Therapy: WFL for tasks assessed/performed Overall Cognitive Status: Within Functional Limits for tasks assessed                                          Exercises      General Comments        Pertinent Vitals/Pain Pain Assessment Pain Assessment: No/denies pain    Home Living Family/patient expects to be discharged to:: Private residence Living Arrangements: Spouse/significant other Available Help at Discharge: Family Type of Home: House Home Access: Ramped entrance       Home Layout: One level        Prior Function  PT Goals (current goals can now be found in the care plan section) Acute Rehab PT Goals Patient Stated Goal: To go home. PT Goal Formulation: With patient/family Time For Goal Achievement: 05/16/22 Potential to Achieve Goals: Good Progress towards PT goals: Progressing toward goals    Frequency    Min 3X/week      PT Plan Current plan remains appropriate    Co-evaluation              AM-PAC PT "6  Clicks" Mobility   Outcome Measure  Help needed turning from your back to your side while in a flat bed without using bedrails?: None Help needed moving from lying on your back to sitting on the side of a flat bed without using bedrails?: A Little Help needed moving to and from a bed to a chair (including a wheelchair)?: A Little Help needed standing up from a chair using your arms (e.g., wheelchair or bedside chair)?: A Little Help needed to walk in hospital room?: A Little Help needed climbing 3-5 steps with a railing? : A Little 6 Click Score: 19    End of Session Equipment Utilized During Treatment: Gait belt Activity Tolerance: Patient tolerated treatment well Patient left: in chair;with call bell/phone within reach;with chair alarm set;with family/visitor present Nurse Communication: Mobility status PT Visit Diagnosis: Unsteadiness on feet (R26.81);Repeated falls (R29.6);Muscle weakness (generalized) (M62.81)     Time: 4562-5638 PT Time Calculation (min) (ACUTE ONLY): 24 min  Charges:  $Gait Training: 23-37 mins                     Baxter Flattery, PT  Acute Rehab Dept ([place]) [phone] Pager [phone]  05/04/2022    [facility] 05/04/2022, 12:29 PM

## 2022-05-04 NOTE — Telephone Encounter (Signed)
The pt appt has been cancelled as requested

## 2022-05-04 NOTE — Telephone Encounter (Signed)
Inbound call from patients husband stating that patient is in the hospital at [facility] and needs to cancel procedure on 6/15.  Patients husband stated they will call back at a later time to get her rescheduled.

## 2022-05-04 NOTE — Assessment & Plan Note (Signed)
Patient developed acute urinary retention most likely secondary to encephalopathy during current hospitalization requiring Foley catheter placement. -Discontinue Foley catheter to give her a voiding trial

## 2022-05-04 NOTE — Assessment & Plan Note (Signed)
Patient was on Lasix and spironolactone at home.  Which are being held due to softer blood pressure.  Blood pressure within goal today. -Continue to monitor and we will restart home medications if blood pressure remains within normal limit

## 2022-05-04 NOTE — Telephone Encounter (Signed)
FYI Appointment cancelled.

## 2022-05-04 NOTE — Assessment & Plan Note (Signed)
Patient with questionable bacteremia, 1 blood culture positive for staph epi and second with Staph capitis.  She was started on vancomycin empirically. Most likely contaminant as repeat blood cultures remain negative. -Stop vancomycin and monitor

## 2022-05-05 LAB — BASIC METABOLIC PANEL
Anion gap: 8 (ref 5–15)
BUN: 16 mg/dL (ref 8–23)
CO2: 24 mmol/L (ref 22–32)
Calcium: 8.7 mg/dL — ABNORMAL LOW (ref 8.9–10.3)
Chloride: 106 mmol/L (ref 98–111)
Creatinine, Ser: 0.75 mg/dL (ref 0.44–1.00)
GFR, Estimated: >60 mL/min (ref >60)
Glucose, Bld: 80 mg/dL (ref 70–99)
Potassium: 3.2 mmol/L — ABNORMAL LOW (ref 3.5–5.1)
Sodium: 138 mmol/L (ref 135–145)

## 2022-05-05 LAB — CULTURE, BLOOD (ROUTINE X 2)

## 2022-05-05 MED ORDER — CEPHALEXIN 500 MG PO CAPS
500.0000 mg | ORAL_CAPSULE | Freq: Four times a day (QID) | ORAL | Status: DC
Start: 2022-05-05 — End: 2022-05-05

## 2022-05-05 MED ORDER — SPIRONOLACTONE 50 MG PO TABS: 100.0000 mg | ORAL_TABLET | Freq: Every day | ORAL | 11 refills | Status: DC

## 2022-05-05 MED ORDER — SULFAMETHOXAZOLE-TRIMETHOPRIM 800-160 MG PO TABS
1.0000 | ORAL_TABLET | Freq: Two times a day (BID) | ORAL | Status: DC
  Administered 2022-05-05: 1 via ORAL
  Filled 2022-05-05: qty 1

## 2022-05-05 MED ORDER — FUROSEMIDE 20 MG PO TABS: 40.0000 mg | ORAL_TABLET | Freq: Every day | ORAL | Status: DC

## 2022-05-05 MED ORDER — SULFAMETHOXAZOLE-TRIMETHOPRIM 800-160 MG PO TABS: 1.0000 | ORAL_TABLET | Freq: Two times a day (BID) | ORAL | 0 refills | Status: AC

## 2022-05-05 NOTE — Hospital Course (Addendum)
Taken from prior notes.  [name] is a 71 y.o. female with medical history significant of cirrhosis of liver, hypothyroidism, stage 3b cecal adenocarcinoma s/p surgery. Presenting with altered mental status and weakness. Per husband for the last week and a half, he's noticed changes in her gait and an overall slowing in her actions/mentation. She hasn't been complaining of balance problems. She hasn't had any falls. She just seems slower. Her appetite has been poorer during this time and she has had nausea and vomiting. He called her GI doc, who deferred to her PCP. After she had a couple of episodes of N/V this morning and increased confusion, he called her PCP. It was recommended that he come to the ED for evaluation. She had not had any fevers, diarrhea, sick contacts. He reports that she hasn't been keeping her lactulose down.  On admission her labs were pertinent for ammonia at 148, T. bili 1.9.  Chest x-ray was without any acute cardiopulmonary findings. CT head with Subtle hypodensity in the right frontal lobe, new from the prior exam, which is technically age indeterminate but could represent acute or subacute infarct. An MRI is recommended for further evaluation.   MRI brain: No acute or reversible finding. Moderate chronic small-vessel ischemic changes of the cerebral hemispheric white matter. Admitted for concern of hepatic encephalopathy and initially received lactulose enema. Ammonia level decreased to 22 and then started trending up but mental status continued to improve.  He UA was concerning for UTI, urine cultures with pansensitive E. Coli. Patient has 2 prior positive blood cultures , 2 different organisms 1 with Staph epidermidis and second with Staph capitis.  Most likely a contaminant.  Repeat blood cultures remain negative.  Patient received ceftriaxone while in the hospital and discharged on 4 more days of Bactrim. She was alert and oriented and at her baseline  before discharge.  Our physical therapist recommended home health services which were ordered.  She was also found to have mild AKI which was resolved with IV fluid.  Patient developed acute urinary retention most likely secondary to encephalopathy requiring Foley catheter.  Foley catheter was removed on 05/04/2022 and she was able to void without any difficulty.  Patient also developed mild hypernatremia as she appears dry which was corrected with D5.  Patient was on Lasix and spironolactone at home which were held during hospitalization and after discharge until she will sees her doctors due to borderline soft blood pressure.  Patient will continue on current medications and follow-up with her providers.

## 2022-05-05 NOTE — Consult Note (Signed)
[facility] CM Inpatient Consult   05/05/2022  Zayna Toste 05/03/1951 [id number]  [place] [facility] CM)   Patient chart has been reviewed with noted high risk score for unplanned readmissions.  Patient assessed for community [place] Management follow up needs. Per review, patient is active with [facility] pharmacy team embedded at primary provider office. PCP office listed for transition of care calls.   Of note, [facility] Care Management services does not replace or interfere with any services that are arranged by inpatient case management or social work.    Netta Cedars, MSN, RN [facility] Liaison Toll free office [phone]

## 2022-05-05 NOTE — Discharge Summary (Signed)
Physician Discharge Summary   Patient: [name] MRN: [id number] DOB: 02/18/1951  Admit date:     04/30/2022  Discharge date: 05/05/22  Discharge Physician: Lorella Nimrod   PCP: Lesleigh Noe, MD   Recommendations at discharge:  Please obtain CBC and CMP in 1 to 2 weeks Obtain TSH in 3 to 4 weeks Follow-up with primary care provider within a week Follow-up with hepatology  Discharge Diagnoses: Principal Problem:   Hepatic encephalopathy ([place]) Active Problems:   Decompensated liver cirrhosis with portal HTN and gastric varices   Portal hypertension ([place])   Hypernatremia   AKI (acute kidney injury) ([place])   UTI (urinary tract infection)   Acute urinary retention   Bacteremia   Hypothyroidism   Hypertension  Resolved Problems:   S/P TIPS (transjugular intrahepatic portosystemic shunt)   Hyperbilirubinemia   Hyperammonemia [facility])  Hospital Course: Taken from prior notes.  [name] is a 71 y.o. female with medical history significant of cirrhosis of liver, hypothyroidism, stage 3b cecal adenocarcinoma s/p surgery. Presenting with altered mental status and weakness. Per husband for the last week and a half, he's noticed changes in her gait and an overall slowing in her actions/mentation. She hasn't been complaining of balance problems. She hasn't had any falls. She just seems slower. Her appetite has been poorer during this time and she has had nausea and vomiting. He called her GI doc, who deferred to her PCP. After she had a couple of episodes of N/V this morning and increased confusion, he called her PCP. It was recommended that he come to the ED for evaluation. She had not had any fevers, diarrhea, sick contacts. He reports that she hasn't been keeping her lactulose down.  On admission her labs were pertinent for ammonia at 148, T. bili 1.9.  Chest x-ray was without any acute cardiopulmonary findings. CT head with Subtle hypodensity in the right frontal lobe, new from  the prior exam, which is technically age indeterminate but could represent acute or subacute infarct. An MRI is recommended for further evaluation.   MRI brain: No acute or reversible finding. Moderate chronic small-vessel ischemic changes of the cerebral hemispheric white matter. Admitted for concern of hepatic encephalopathy and initially received lactulose enema. Ammonia level decreased to 22 and then started trending up but mental status continued to improve.  He UA was concerning for UTI, urine cultures with pansensitive E. Coli. Patient has 2 prior positive blood cultures , 2 different organisms 1 with Staph epidermidis and second with Staph capitis.  Most likely a contaminant.  Repeat blood cultures remain negative.  Patient received ceftriaxone while in the hospital and discharged on 4 more days of Bactrim. She was alert and oriented and at her baseline before discharge.  Our physical therapist recommended home health services which were ordered.  She was also found to have mild AKI which was resolved with IV fluid.  Patient developed acute urinary retention most likely secondary to encephalopathy requiring Foley catheter.  Foley catheter was removed on 05/04/2022 and she was able to void without any difficulty.  Patient also developed mild hypernatremia as she appears dry which was corrected with D5.  Patient was on Lasix and spironolactone at home which were held during hospitalization and after discharge until she will sees her doctors due to borderline soft blood pressure.  Patient will continue on current medications and follow-up with her providers.  Assessment and Plan: * Hepatic encephalopathy ([place]) Presented with hepatic encephalopathy, unable to take oral  lactulose at home due to nausea and vomiting.  Ammonia level initially improved to 22 and then started trending up. Significant improvement in mental status today. -Continue with lactulose. -Patient will follow-up  with her hepatologist after discharge  Decompensated liver cirrhosis with portal HTN and gastric varices Patient follow-up with hepatology as an outpatient. History of TIPS procedure many years ago. Mental status improving. -Continue with lactulose  Hypernatremia Mild hypernatremia most likely secondary to dehydration. -Switch LR with D5 -Monitor sodium  Portal hypertension ([facility]) S/p TIPS.  Patient was on Lasix and spironolactone at home which is being held during current hospitalization due to AKI. -Continue to monitor  AKI (acute kidney injury) ([place]) Resolved. Patient appears dry on admission.  Creatinine at baseline now -Monitor renal function -Avoid nephrotoxins  UTI (urinary tract infection) Urine culture with pansensitive E. coli.  Patient is on ceftriaxone. -Can switch ceftriaxone with Keflex on discharge.  Acute urinary retention Patient developed acute urinary retention most likely secondary to encephalopathy during current hospitalization requiring Foley catheter placement. -Discontinue Foley catheter to give her a voiding trial  Bacteremia Patient with questionable bacteremia, 1 blood culture positive for staph epi and second with Staph capitis.  She was started on vancomycin empirically. Most likely contaminant as repeat blood cultures remain negative. -Stop vancomycin and monitor  Hypothyroidism - Continue Synthroid  Hypertension Patient was on Lasix and spironolactone at home.  Which are being held due to softer blood pressure.  Blood pressure within goal today. -Continue to monitor and we will restart home medications if blood pressure remains within normal limit      Consultants: None Procedures performed: None Disposition: Home health Diet recommendation:  Discharge Diet Orders (From admission, onward)     Start     Ordered   05/05/22 0000  Diet - low sodium heart healthy        05/05/22 1402           Cardiac diet DISCHARGE  MEDICATION: Allergies as of 05/05/2022       Reactions   Contrast Media [iodinated Contrast Media] Hives        Medication List     STOP taking these medications    levOCARNitine 330 MG tablet Commonly known as: CARNITOR   Sutab [phone] MG Tabs Generic drug: Sodium Sulfate-Mag Sulfate-KCl       TAKE these medications    AYR SALINE NASAL NA Place 1 spray into the nose daily as needed (congestion).   B-12 5000 MCG Caps Take 5,000 mcg by mouth daily.   ELDERBERRY PO Take 15-30 mLs by mouth See admin instructions. Take 15 ml in the morning and 30 ml at night   Enulose 10 GM/15ML Soln Generic drug: lactulose (encephalopathy) Take 40 g by mouth 3 (three) times daily.   furosemide 20 MG tablet Commonly known as: LASIX Take 2 tablets (40 mg total) by mouth daily. Hold until you see your liver doctor What changed: additional instructions   L-Carnitine 500 MG Tabs Take 500 mg by mouth every evening.   Multi-Vitamin tablet Take 1 tablet by mouth daily.   spironolactone 50 MG tablet Commonly known as: ALDACTONE Take 2 tablets (100 mg total) by mouth daily. Hold until you see your liver doctor What changed: additional instructions   sulfamethoxazole-trimethoprim 800-160 MG tablet Commonly known as: BACTRIM DS Take 1 tablet by mouth every 12 (twelve) hours for 4 days.   Synthroid 75 MCG tablet Generic drug: levothyroxine Take 1 tablet (75 mcg total) by mouth daily  before breakfast.   VITAMIN C PO Take 2,000 mg by mouth daily.   Vitamin D3 50 MCG (2000 UT) Tabs Take 2,000 Units by mouth at bedtime.   zinc gluconate 50 MG tablet Take 50 mg by mouth 3 (three) times daily.               Discharge Care Instructions  (From admission, onward)           Start     Ordered   05/05/22 0000  No dressing needed        05/05/22 1402            Follow-up Information     Lesleigh Noe, MD. Schedule an appointment as soon as possible for a visit  in 1 week(s).   Specialty: Family Medicine Contact information: [place] [address] [phone]                Discharge Exam: Danley Danker Weights   04/30/22 1244 05/01/22 0701  Weight: 72.1 kg 64.4 kg   General.  Frail elderly lady, in no acute distress. Pulmonary.  Lungs clear bilaterally, normal respiratory effort. CV.  Regular rate and rhythm, no JVD, rub or murmur. Abdomen.  Soft, nontender, nondistended, BS positive. CNS.  Alert and oriented .  No focal neurologic deficit. Extremities.  No edema, no cyanosis, pulses intact and symmetrical. Psychiatry.  Judgment and insight appears normal.   Condition at discharge: stable  The results of significant diagnostics from this hospitalization (including imaging, microbiology, ancillary and laboratory) are listed below for reference.   Imaging Studies: DG Abd 1 View  Result Date: 05/02/2022 CLINICAL DATA:  Nausea, vomiting EXAM: ABDOMEN - 1 VIEW COMPARISON:  None Available. FINDINGS: Nonobstructive pattern of bowel gas. Moderate burden of stool throughout the colon. No obvious free air on single supine radiograph. TIPS in the right upper quadrant. No radio-opaque calculi or other significant radiographic abnormality are seen. IMPRESSION: 1. Nonobstructive pattern of bowel gas. Moderate burden of stool throughout the colon. 2. TIPS in the right upper quadrant. 3. No obvious free air on single supine radiograph. Electronically Signed   By: Delanna Ahmadi M.D.   On: 05/02/2022 16:37   US RENAL  Result Date: 05/01/2022 CLINICAL DATA:  Acute kidney injury. EXAM: RENAL / URINARY TRACT ULTRASOUND COMPLETE COMPARISON:  MRI examination dated June 09, 2021 FINDINGS: Right Kidney: Renal measurements: 9.4 x 5.0 x 4.0 = volume: 97.3 mL. Echogenicity within normal limits. No mass or hydronephrosis visualized. Left Kidney: Renal measurements: 10.6 x 4.6 x 4.0 = volume: 102.2 mL. Echogenicity within normal limits. No mass or  hydronephrosis visualized. Bladder: Appears normal for degree of bladder distention. Other: None. IMPRESSION: No evidence of nephrolithiasis or hydronephrosis. Electronically Signed   By: Keane Police D.O.   On: 05/01/2022 09:48   US Abdomen Complete  Result Date: 05/01/2022 CLINICAL DATA:  AKI, elevated LFTs. EXAM: ABDOMEN ULTRASOUND COMPLETE COMPARISON:  01/12/2022, 05/25/2006. FINDINGS: Gallbladder: Stones are present within the gallbladder. There is focal thickening of the gallbladder at the fundus. There is focal thickening of. No sonographic Murphy sign noted by sonographer. Common bile duct: Diameter: 5 mm Liver: No focal lesion identified. The liver is heterogeneous in echotexture with a nodular contour. A TIPS stent is patent. Portal vein is patent on color Doppler imaging with normal direction of blood flow towards the liver. IVC: No abnormality visualized. Pancreas: Visualized portion unremarkable. Spleen: Size and appearance within normal limits. Right Kidney: Length: 9.5  cm. Echogenicity within normal limits. No mass or hydronephrosis visualized. Left Kidney: Length: 10.5 cm. Echogenicity within normal limits. No mass or hydronephrosis visualized. Abdominal aorta: Ectasia of the distal abdominal aorta with plaque. Other findings: No free fluid. IMPRESSION: 1. Cholelithiasis without acute cholecystitis. 2. Thickening of the tip of gallbladder fundus, previously characterized as adenomyomatosis on MRI. 3. Morphologic findings of cirrhosis.  TIPS stent is patent. Electronically Signed   By: Brett Fairy M.D.   On: 05/01/2022 04:12   MR BRAIN WO CONTRAST  Result Date: 04/30/2022 CLINICAL DATA:  Mental status change of unknown cause. Confusion over the last 2 days. Weakness. EXAM: MRI HEAD WITHOUT CONTRAST TECHNIQUE: Multiplanar, multiecho pulse sequences of the brain and surrounding structures were obtained without intravenous contrast. COMPARISON:  Head CT same day FINDINGS: Brain: Diffusion imaging  does not show any acute or subacute infarction or other cause of restricted diffusion. No focal abnormality affects the brainstem or cerebellum. Cerebral hemispheres show moderate changes of chronic small vessel disease within the hemispheric white matter. No cortical or large vessel territory infarction. No mass lesion, hemorrhage, hydrocephalus or extra-axial collection. Vascular: Major vessels at the base of the brain show flow. Skull and upper cervical spine: Negative Sinuses/Orbits: Clear/normal Other: None IMPRESSION: No acute or reversible finding. Moderate chronic small-vessel ischemic changes of the cerebral hemispheric white matter. Electronically Signed   By: Nelson Chimes M.D.   On: 04/30/2022 15:39   CT Head Wo Contrast  Result Date: 04/30/2022 CLINICAL DATA:  Weakness, nausea, confusion EXAM: CT HEAD WITHOUT CONTRAST TECHNIQUE: Contiguous axial images were obtained from the base of the skull through the vertex without intravenous contrast. RADIATION DOSE REDUCTION: This exam was performed according to the departmental dose-optimization program which includes automated exposure control, adjustment of the mA and/or kV according to patient size and/or use of iterative reconstruction technique. COMPARISON:  11/27/2020 FINDINGS: Brain: Subtle hypodensity in the right frontal lobe (series 2, image 19) which is new from the prior exam. No evidence of acute infarction, hemorrhage, cerebral edema, mass, mass effect, or midline shift. No hydrocephalus or extra-axial fluid collection. Vascular: No hyperdense vessel. Skull: Normal. Negative for fracture or focal lesion. Sinuses/Orbits: No acute finding. Other: The mastoid air cells are well aerated. IMPRESSION: Subtle hypodensity in the right frontal lobe, new from the prior exam, which is technically age indeterminate but could represent acute or subacute infarct. An MRI is recommended for further evaluation. These results were called by telephone at the time  of interpretation on 04/30/2022 at 2:206 pm to provider DAN FLOYD , who verbally acknowledged these results. Electronically Signed   By: Merilyn Baba M.D.   On: 04/30/2022 14:20   DG Chest Port 1 View  Result Date: 04/30/2022 CLINICAL DATA:  Sepsis. EXAM: PORTABLE CHEST 1 VIEW COMPARISON:  02/02/2022 FINDINGS: 1254 hours. The cardio pericardial silhouette is enlarged. The lungs are clear without focal pneumonia, edema, pneumothorax or pleural effusion. Bones are diffusely demineralized. Telemetry leads overlie the chest. IMPRESSION: Low volume film without acute cardiopulmonary findings. Electronically Signed   By: Misty Stanley M.D.   On: 04/30/2022 13:07    Microbiology: Results for orders placed or performed during the hospital encounter of 04/30/22  Blood Culture (routine x 2)     Status: Abnormal   Collection Time: 04/30/22 12:48 PM   Specimen: BLOOD  Result Value Ref Range Status   Specimen Description BLOOD SITE NOT SPECIFIED  Final   Special Requests   Final    BOTTLES DRAWN  AEROBIC AND ANAEROBIC Blood Culture results may not be optimal due to an inadequate volume of blood received in culture bottles   Culture  Setup Time   Final    GRAM POSITIVE COCCI IN CLUSTERS IN BOTH AEROBIC AND ANAEROBIC BOTTLES Organism ID to follow CRITICAL RESULT CALLED TO, READ BACK BY AND VERIFIED WITH: PHARMD MELISSA JAMES [id number] MLM    Culture (A)  Final    STAPHYLOCOCCUS CAPITIS SUSCEPTIBILITIES PERFORMED ON PREVIOUS CULTURE WITHIN THE LAST 5 DAYS. Performed at [facility], [place] [address], [place], [place] [address]    Report Status 05/05/2022 FINAL  Final  Blood Culture ID Panel (Reflexed)     Status: Abnormal   Collection Time: 04/30/22 12:48 PM  Result Value Ref Range Status   Enterococcus faecalis NOT DETECTED NOT DETECTED Final   Enterococcus Faecium NOT DETECTED NOT DETECTED Final   Listeria monocytogenes NOT DETECTED NOT DETECTED Final   Staphylococcus species DETECTED (A) NOT  DETECTED Final    Comment: CRITICAL RESULT CALLED TO, READ BACK BY AND VERIFIED WITH: PHARMD MELISSA JAMES [id number] MLM    Staphylococcus aureus (BCID) NOT DETECTED NOT DETECTED Final   Staphylococcus epidermidis NOT DETECTED NOT DETECTED Final   Staphylococcus lugdunensis NOT DETECTED NOT DETECTED Final   Streptococcus species NOT DETECTED NOT DETECTED Final   Streptococcus agalactiae NOT DETECTED NOT DETECTED Final   Streptococcus pneumoniae NOT DETECTED NOT DETECTED Final   Streptococcus pyogenes NOT DETECTED NOT DETECTED Final   A.calcoaceticus-baumannii NOT DETECTED NOT DETECTED Final   Bacteroides fragilis NOT DETECTED NOT DETECTED Final   Enterobacterales NOT DETECTED NOT DETECTED Final   Enterobacter cloacae complex NOT DETECTED NOT DETECTED Final   Escherichia coli NOT DETECTED NOT DETECTED Final   Klebsiella aerogenes NOT DETECTED NOT DETECTED Final   Klebsiella oxytoca NOT DETECTED NOT DETECTED Final   Klebsiella pneumoniae NOT DETECTED NOT DETECTED Final   Proteus species NOT DETECTED NOT DETECTED Final   Salmonella species NOT DETECTED NOT DETECTED Final   Serratia marcescens NOT DETECTED NOT DETECTED Final   Haemophilus influenzae NOT DETECTED NOT DETECTED Final   Neisseria meningitidis NOT DETECTED NOT DETECTED Final   Pseudomonas aeruginosa NOT DETECTED NOT DETECTED Final   Stenotrophomonas maltophilia NOT DETECTED NOT DETECTED Final   Candida albicans NOT DETECTED NOT DETECTED Final   Candida auris NOT DETECTED NOT DETECTED Final   Candida glabrata NOT DETECTED NOT DETECTED Final   Candida krusei NOT DETECTED NOT DETECTED Final   Candida parapsilosis NOT DETECTED NOT DETECTED Final   Candida tropicalis NOT DETECTED NOT DETECTED Final   Cryptococcus neoformans/gattii NOT DETECTED NOT DETECTED Final    Comment: Performed at [facility], [address], [place], [place] [address]  Blood Culture (routine x 2)     Status: Abnormal   Collection Time: 04/30/22  12:53 PM   Specimen: BLOOD  Result Value Ref Range Status   Specimen Description   Final    BLOOD RIGHT ANTECUBITAL Performed at [facility], [place] [address], [place], [place] [address]    Special Requests   Final    BOTTLES DRAWN AEROBIC AND ANAEROBIC Blood Culture results may not be optimal due to an inadequate volume of blood received in culture bottles Performed at [facility] [address], [place], [place] [address]    Culture  Setup Time   Final    GRAM POSITIVE COCCI IN CLUSTERS IN BOTH AEROBIC AND ANAEROBIC BOTTLES Performed at [facility], [place] [address] [place], [place] [address]    Culture (A)  Final    STAPHYLOCOCCUS EPIDERMIDIS THE SIGNIFICANCE OF ISOLATING THIS ORGANISM FROM A SINGLE SET OF BLOOD CULTURES WHEN MULTIPLE SETS ARE DRAWN IS UNCERTAIN. PLEASE NOTIFY THE MICROBIOLOGY DEPARTMENT WITHIN ONE WEEK IF SPECIATION AND SENSITIVITIES ARE REQUIRED. STAPHYLOCOCCUS CAPITIS    Report Status 05/05/2022 FINAL  Final   Organism ID, Bacteria STAPHYLOCOCCUS CAPITIS  Final      Susceptibility   Staphylococcus capitis - MIC*    CIPROFLOXACIN <=0.5 SENSITIVE Sensitive     ERYTHROMYCIN <=0.25 SENSITIVE Sensitive     GENTAMICIN <=0.5 SENSITIVE Sensitive     OXACILLIN <=0.25 SENSITIVE Sensitive     TETRACYCLINE <=1 SENSITIVE Sensitive     VANCOMYCIN <=0.5 SENSITIVE Sensitive     TRIMETH/SULFA <=10 SENSITIVE Sensitive     CLINDAMYCIN <=0.25 SENSITIVE Sensitive     RIFAMPIN <=0.5 SENSITIVE Sensitive     Inducible Clindamycin NEGATIVE Sensitive     * STAPHYLOCOCCUS CAPITIS  Urine Culture     Status: Abnormal (Preliminary result)   Collection Time: 05/01/22  2:30 PM   Specimen: In/Out Cath Urine  Result Value Ref Range Status   Specimen Description   Final    IN/OUT CATH URINE Performed at [facility] [address] [place], [place] [address]    Special Requests   Final    NONE Performed at [facility], [place] [address], [place], [place] [address]    Culture (A)  Final    >=100,000 COLONIES/mL ESCHERICHIA COLI 60,000 COLONIES/mL ENTEROCOCCUS AVIUM SUSCEPTIBILITIES TO FOLLOW Performed at [facility], [place] [address], [place], [place] [address]    Report Status PENDING  Incomplete   Organism ID, Bacteria ESCHERICHIA COLI (A)  Final      Susceptibility   Escherichia coli - MIC*    AMPICILLIN 8 SENSITIVE Sensitive     CEFAZOLIN <=4 SENSITIVE Sensitive     CEFEPIME <=0.12 SENSITIVE Sensitive     CEFTRIAXONE <=0.25 SENSITIVE Sensitive     CIPROFLOXACIN <=0.25 SENSITIVE Sensitive     GENTAMICIN <=1 SENSITIVE Sensitive     IMIPENEM <=0.25 SENSITIVE Sensitive     NITROFURANTOIN <=16 SENSITIVE Sensitive     TRIMETH/SULFA <=20 SENSITIVE Sensitive     AMPICILLIN/SULBACTAM 4 SENSITIVE Sensitive     PIP/TAZO <=4 SENSITIVE Sensitive     * >=100,000 COLONIES/mL ESCHERICHIA COLI  Culture, blood (Routine X 2) w Reflex to ID Panel     Status: None (Preliminary result)   Collection Time: 05/02/22  4:16 PM   Specimen: BLOOD  Result Value Ref Range Status   Specimen Description   Final    BLOOD RIGHT ANTECUBITAL Performed at [place] [address] [place], [place] [address]    Special Requests   Final    BOTTLES DRAWN AEROBIC AND ANAEROBIC Blood Culture results may not be optimal due to an excessive volume of blood received in culture bottles Performed at [facility] [address], [place], [place] [address]    Culture   Final    NO GROWTH 3 DAYS Performed at [facility], [place] [address], [place], [place] [address]    Report Status PENDING  Incomplete  Culture, blood (Routine X 2) w Reflex to ID Panel     Status: None (Preliminary result)   Collection Time: 05/02/22  4:16 PM   Specimen: BLOOD  Result Value Ref Range Status  Specimen Description   Final    BLOOD LEFT ANTECUBITAL Performed at [place] [address] [place], [place] [address]    Special Requests   Final    BOTTLES DRAWN AEROBIC AND ANAEROBIC Blood Culture adequate volume Performed at [facility] [address], [place], [place] [address]    Culture   Final    NO GROWTH 3 DAYS Performed at [facility], [place] [address], [place], [place] [address]    Report Status PENDING  Incomplete    Labs: CBC: Recent Labs  Lab 04/30/22 1251 05/01/22 0715 05/02/22 0451 05/03/22 0432 05/04/22 0358  WBC 4.8 7.6 3.3* 4.4 3.6*  NEUTROABS 2.9  --  1.9 3.3  --   HGB 13.6 13.1 12.7 11.9* 11.7*  HCT 40.4 39.5 38.8 36.4 36.4  MCV 100.7* 103.1* 105.7* 104.3* 106.4*  PLT 131* 116* 102* 121* 96*   Basic Metabolic Panel: Recent Labs  Lab 05/01/22 0715 05/02/22 0451 05/03/22 0432 05/04/22 0358 05/05/22 0951  NA 142 144 148* 148* 138  K 4.9 4.6 4.3 3.7 3.2*  CL 107 113* 116* 117* 106  CO2 '26 29 25 26 24  '$ GLUCOSE 82 84 114* 91 80  BUN 44* 40* 38* 26* 16  CREATININE 1.25* 1.25* 1.08* 0.84 0.75  CALCIUM 9.7 9.3 9.1 9.0 8.7*  MG  --   --  1.8 1.7  --   PHOS  --   --  2.2* 2.7  --    Liver Function Tests: Recent Labs  Lab 04/30/22 1251 05/01/22 0256 05/01/22 0715 05/02/22 0451 05/03/22 0432  AST 126*  --  107* 90* 72*  ALT 91*  --  82* 72* 59*  ALKPHOS 206*  --  183* 159* 145*  BILITOT 1.9* 2.7* 2.4* 2.3* 1.6*  PROT 7.2  --  7.0 6.7 6.4*  ALBUMIN 3.2*  --  3.2* 2.9* 2.8*   CBG: No results for input(s): "GLUCAP" in the last 168 hours.  Discharge time spent: greater than 30 minutes.  This record has been created using Systems analyst. Errors have been sought and corrected,but may not always be located. Such creation errors do not reflect on the standard of care.   Signed: Lorella Nimrod, MD [facility] 05/05/2022

## 2022-05-06 LAB — URINE CULTURE: Culture: >=100000 — AB

## 2022-05-07 ENCOUNTER — Ambulatory Visit (HOSPITAL_COMMUNITY): Admission: RE | Admit: 2022-05-07 | Payer: Medicare Other | Source: Home / Self Care | Admitting: Gastroenterology

## 2022-05-07 LAB — CULTURE, BLOOD (ROUTINE X 2)
Culture: NO GROWTH
Culture: NO GROWTH
Special Requests: ADEQUATE

## 2022-05-07 SURGERY — ESOPHAGOGASTRODUODENOSCOPY (EGD) WITH PROPOFOL
Anesthesia: Monitor Anesthesia Care

## 2022-05-10 ENCOUNTER — Encounter: Payer: Self-pay | Admitting: Gastroenterology

## 2022-05-18 ENCOUNTER — Ambulatory Visit (INDEPENDENT_AMBULATORY_CARE_PROVIDER_SITE_OTHER): Payer: Medicare Other | Admitting: Family Medicine

## 2022-05-18 ENCOUNTER — Encounter: Payer: Self-pay | Admitting: Family Medicine

## 2022-05-18 VITALS — BP 118/60 | HR 58 | Temp 97.5°F | Wt 167.0 lb

## 2022-05-18 DIAGNOSIS — E870 Hyperosmolality and hypernatremia: Secondary | ICD-10-CM | POA: Diagnosis not present

## 2022-05-18 DIAGNOSIS — D649 Anemia, unspecified: Secondary | ICD-10-CM | POA: Diagnosis not present

## 2022-05-18 DIAGNOSIS — E039 Hypothyroidism, unspecified: Secondary | ICD-10-CM

## 2022-05-18 DIAGNOSIS — D638 Anemia in other chronic diseases classified elsewhere: Secondary | ICD-10-CM | POA: Insufficient documentation

## 2022-05-18 DIAGNOSIS — K7460 Unspecified cirrhosis of liver: Secondary | ICD-10-CM

## 2022-05-18 DIAGNOSIS — N390 Urinary tract infection, site not specified: Secondary | ICD-10-CM | POA: Insufficient documentation

## 2022-05-18 DIAGNOSIS — I10 Essential (primary) hypertension: Secondary | ICD-10-CM

## 2022-05-18 LAB — CBC WITH DIFFERENTIAL/PLATELET
Basophils Absolute: 0 10*3/uL (ref 0.0–0.1)
Basophils Relative: 1.5 % (ref 0.0–3.0)
Eosinophils Absolute: 0.1 10*3/uL (ref 0.0–0.7)
Eosinophils Relative: 3.2 % (ref 0.0–5.0)
HCT: 33.1 % — ABNORMAL LOW (ref 36.0–46.0)
Hemoglobin: 11.1 g/dL — ABNORMAL LOW (ref 12.0–15.0)
Lymphocytes Relative: 34.1 % (ref 12.0–46.0)
Lymphs Abs: 0.8 10*3/uL (ref 0.7–4.0)
MCHC: 33.3 g/dL (ref 30.0–36.0)
MCV: 104.1 fl — ABNORMAL HIGH (ref 78.0–100.0)
Monocytes Absolute: 0.2 10*3/uL (ref 0.1–1.0)
Monocytes Relative: 9.7 % (ref 3.0–12.0)
Neutro Abs: 1.1 10*3/uL — ABNORMAL LOW (ref 1.4–7.7)
Neutrophils Relative %: 51.5 % (ref 43.0–77.0)
Platelets: 145 10*3/uL — ABNORMAL LOW (ref 150.0–400.0)
RBC: 3.19 Mil/uL — ABNORMAL LOW (ref 3.87–5.11)
RDW: 17.6 % — ABNORMAL HIGH (ref 11.5–15.5)
WBC: 2.2 10*3/uL — ABNORMAL LOW (ref 4.0–10.5)

## 2022-05-18 LAB — COMPREHENSIVE METABOLIC PANEL
ALT: 60 U/L — ABNORMAL HIGH (ref 0–35)
AST: 83 U/L — ABNORMAL HIGH (ref 0–37)
Albumin: 2.9 g/dL — ABNORMAL LOW (ref 3.5–5.2)
Alkaline Phosphatase: 202 U/L — ABNORMAL HIGH (ref 39–117)
BUN: 17 mg/dL (ref 6–23)
CO2: 28 mEq/L (ref 19–32)
Calcium: 9 mg/dL (ref 8.4–10.5)
Chloride: 107 mEq/L (ref 96–112)
Creatinine, Ser: 0.77 mg/dL (ref 0.40–1.20)
GFR: 77.85 mL/min (ref >60.00)
Glucose, Bld: 80 mg/dL (ref 70–99)
Potassium: 4.1 mEq/L (ref 3.5–5.1)
Sodium: 142 mEq/L (ref 135–145)
Total Bilirubin: 0.9 mg/dL (ref 0.2–1.2)
Total Protein: 5.6 g/dL — ABNORMAL LOW (ref 6.0–8.3)

## 2022-05-18 LAB — TSH: TSH: 1.62 u[IU]/mL (ref 0.35–5.50)

## 2022-05-18 NOTE — Assessment & Plan Note (Signed)
Stable anemia in the hospital, will recheck labs.

## 2022-05-18 NOTE — Assessment & Plan Note (Signed)
Patient with UTI on the last 2 hospital admissions.  Question whether or not this triggered her hepatic encephalopathy or if this is colonization that is presenting and found during encephalopathy.  She denies any dysuria but does get confused.  Check urine culture today as well as referral to urology due to intermittent retention which may also be contributing to her risk for UTI.

## 2022-05-18 NOTE — Assessment & Plan Note (Signed)
Reviewed recent hospitalization requiring lactulose enema due to nausea and vomiting.  This is her second hepatic encephalopathy admission in the last 6 months.  She had GI support continue lactulose as prescribed.  Lower extremity edema present blood pressure slightly improved, will restart Lasix 40 mg daily.  Monitor blood pressure and edema and consider restarting spironolactone in 2 to 3 days.  Update as needed with questions.

## 2022-05-19 ENCOUNTER — Other Ambulatory Visit: Payer: Self-pay | Admitting: Family Medicine

## 2022-05-19 LAB — URINE CULTURE
MICRO NUMBER:: 13571948
SPECIMEN QUALITY:: ADEQUATE

## 2022-05-20 ENCOUNTER — Encounter: Payer: Self-pay | Admitting: Family Medicine

## 2022-05-20 ENCOUNTER — Telehealth: Payer: Medicare Other

## 2022-05-20 DIAGNOSIS — I8510 Secondary esophageal varices without bleeding: Secondary | ICD-10-CM | POA: Diagnosis not present

## 2022-05-20 DIAGNOSIS — K7682 Hepatic encephalopathy: Secondary | ICD-10-CM | POA: Diagnosis not present

## 2022-05-20 DIAGNOSIS — D708 Other neutropenia: Secondary | ICD-10-CM

## 2022-05-20 DIAGNOSIS — K7469 Other cirrhosis of liver: Secondary | ICD-10-CM | POA: Diagnosis not present

## 2022-05-20 DIAGNOSIS — D709 Neutropenia, unspecified: Secondary | ICD-10-CM | POA: Diagnosis not present

## 2022-05-28 DIAGNOSIS — H2511 Age-related nuclear cataract, right eye: Secondary | ICD-10-CM | POA: Diagnosis not present

## 2022-05-29 DIAGNOSIS — H2512 Age-related nuclear cataract, left eye: Secondary | ICD-10-CM | POA: Diagnosis not present

## 2022-06-01 ENCOUNTER — Encounter: Payer: Self-pay | Admitting: Internal Medicine

## 2022-06-01 ENCOUNTER — Telehealth (INDEPENDENT_AMBULATORY_CARE_PROVIDER_SITE_OTHER): Payer: Medicare Other | Admitting: Internal Medicine

## 2022-06-01 ENCOUNTER — Other Ambulatory Visit (INDEPENDENT_AMBULATORY_CARE_PROVIDER_SITE_OTHER): Payer: Medicare Other

## 2022-06-01 VITALS — BP 115/57 | HR 53

## 2022-06-01 DIAGNOSIS — N390 Urinary tract infection, site not specified: Secondary | ICD-10-CM | POA: Diagnosis not present

## 2022-06-01 DIAGNOSIS — R198 Other specified symptoms and signs involving the digestive system and abdomen: Secondary | ICD-10-CM

## 2022-06-01 DIAGNOSIS — N179 Acute kidney failure, unspecified: Secondary | ICD-10-CM | POA: Diagnosis not present

## 2022-06-01 DIAGNOSIS — D708 Other neutropenia: Secondary | ICD-10-CM | POA: Diagnosis not present

## 2022-06-01 LAB — POC URINALSYSI DIPSTICK (AUTOMATED)
Bilirubin, UA: NEGATIVE
Blood, UA: NEGATIVE
Glucose, UA: NEGATIVE
Ketones, UA: NEGATIVE
Leukocytes, UA: NEGATIVE
Nitrite, UA: NEGATIVE
Protein, UA: NEGATIVE
Spec Grav, UA: 1.015 (ref 1.010–1.025)
Urobilinogen, UA: 0.2 E.U./dL
pH, UA: 6.5 (ref 5.0–8.0)

## 2022-06-01 LAB — CBC WITH DIFFERENTIAL/PLATELET
Basophils Absolute: 0 10*3/uL (ref 0.0–0.1)
Basophils Relative: 1.1 % (ref 0.0–3.0)
Eosinophils Absolute: 0.2 10*3/uL (ref 0.0–0.7)
Eosinophils Relative: 6.4 % — ABNORMAL HIGH (ref 0.0–5.0)
HCT: 36.2 % (ref 36.0–46.0)
Hemoglobin: 11.9 g/dL — ABNORMAL LOW (ref 12.0–15.0)
Lymphocytes Relative: 39.4 % (ref 12.0–46.0)
Lymphs Abs: 0.9 10*3/uL (ref 0.7–4.0)
MCHC: 32.9 g/dL (ref 30.0–36.0)
MCV: 104.8 fl — ABNORMAL HIGH (ref 78.0–100.0)
Monocytes Absolute: 0.3 10*3/uL (ref 0.1–1.0)
Monocytes Relative: 12.2 % — ABNORMAL HIGH (ref 3.0–12.0)
Neutro Abs: 1 10*3/uL — ABNORMAL LOW (ref 1.4–7.7)
Neutrophils Relative %: 40.9 % — ABNORMAL LOW (ref 43.0–77.0)
Platelets: 134 10*3/uL — ABNORMAL LOW (ref 150.0–400.0)
RBC: 3.45 Mil/uL — ABNORMAL LOW (ref 3.87–5.11)
RDW: 16.3 % — ABNORMAL HIGH (ref 11.5–15.5)
WBC: 2.4 10*3/uL — ABNORMAL LOW (ref 4.0–10.5)

## 2022-06-01 LAB — BASIC METABOLIC PANEL
BUN: 19 mg/dL (ref 6–23)
CO2: 34 mEq/L — ABNORMAL HIGH (ref 19–32)
Calcium: 9.5 mg/dL (ref 8.4–10.5)
Chloride: 104 mEq/L (ref 96–112)
Creatinine, Ser: 0.95 mg/dL (ref 0.40–1.20)
GFR: 60.49 mL/min (ref >60.00)
Glucose, Bld: 82 mg/dL (ref 70–99)
Potassium: 3.6 mEq/L (ref 3.5–5.1)
Sodium: 142 mEq/L (ref 135–145)

## 2022-06-01 MED ORDER — AMOXICILLIN 875 MG PO TABS: 875.0000 mg | ORAL_TABLET | Freq: Two times a day (BID) | ORAL | 1 refills | Status: DC

## 2022-06-01 NOTE — Assessment & Plan Note (Signed)
Had E. Coli and Enterococcus last month Urinalysis had negative nitrite but still had leuks at that time (so this is the same as last time despite no symptoms) Will go ahead with amoxil 875 bid x 3 days---just in case (to avoid hepatic encephalopathy)

## 2022-06-01 NOTE — Progress Notes (Signed)
Subjective:    Patient ID: [name], female    DOB: 1951/08/11, 71 y.o.   MRN: [id number]  HPI Video virtual visit due to abdominal symptoms and possible UTI Identification done Reviewed limitations and billing and she gave consent Participants---patient in her car and husband is with her to help with history-- and I am in my office  This morning--had feeling of nausea and abdominal fullness Husband took temp--it was 93.4 ("another hint that something is wrong") This usually portends a UTI  "I feel good as far as clarity" Able to walk and function--no delirium No abdominal pain The fullness feeling has improved No nausea now. No vomiting Has been able to eat--just an egg this morning No dysuria or hematuria  No apparent abdominal distention  Current Outpatient Medications on File Prior to Visit  Medication Sig Dispense Refill   Ascorbic Acid (VITAMIN C PO) Take 2,000 mg by mouth daily.     AYR SALINE NASAL NA Place 1 spray into the nose daily as needed (congestion).     Cholecalciferol (VITAMIN D3) 50 MCG (2000 UT) TABS Take 2,000 Units by mouth at bedtime.      ELDERBERRY PO Take 15-30 mLs by mouth See admin instructions. Take 15 ml in the morning and 30 ml at night     ENULOSE 10 GM/15ML SOLN Take 40 g by mouth 3 (three) times daily.     furosemide (LASIX) 20 MG tablet Take 2 tablets (40 mg total) by mouth daily. Hold until you see your liver doctor 30 tablet    levOCARNitine (L-CARNITINE) 500 MG TABS Take 500 mg by mouth every evening.     Multiple Vitamin (MULTI-VITAMIN) tablet Take 1 tablet by mouth daily.     spironolactone (ALDACTONE) 50 MG tablet Take 2 tablets (100 mg total) by mouth daily. Hold until you see your liver doctor 60 tablet 11   SYNTHROID 75 MCG tablet Take 1 tablet (75 mcg total) by mouth daily before breakfast. 90 tablet 1   zinc gluconate 50 MG tablet Take 50 mg by mouth 3 (three) times daily.     No current facility-administered medications on  file prior to visit.    Allergies  Allergen Reactions   Contrast Media [Iodinated Contrast Media] Hives    Past Medical History:  Diagnosis Date   Acute urinary retention 05/04/2022   Cancer ([place])    cecum   Elevated liver function tests    Esophageal varices (HCC)    Hemorrhage of gastrointestinal tract 05/04/2011   Hypothyroidism    Iron deficiency anemia    Liver disease    chemotherapy complication, per pt, shunts placed to bypass liver   Malignant neoplasm of cecum ([place])    Portal hypertension ([place])    Splenomegaly     Past Surgical History:  Procedure Laterality Date   ESOPHAGEAL VARICE LIGATION     HEMICOLECTOMY  01/08/03   IR RADIOLOGIST EVAL & MGMT  12/20/2020   IR RADIOLOGIST EVAL & MGMT  05/29/2021   LIVER SURGERY     shunts placed after chemo complication   SKIN FULL THICKNESS GRAFT N/A 09/12/2019   Procedure: debridement and FTSG to the nose from left upper arm;  Surgeon: Cindra Presume, MD;  Location: [facility];  Service: Plastics;  Laterality: N/A;  2 hours, please   TIPS PROCEDURE      Family History  Problem Relation Age of Onset   Arthritis Mother    Hearing loss Mother  Heart disease Mother    Hypertension Mother    Arthritis Father    Diabetes Father    Heart disease Father    Breast cancer Neg Hx    Colon cancer Neg Hx    Esophageal cancer Neg Hx    Pancreatic cancer Neg Hx    Stomach cancer Neg Hx     Social History   Socioeconomic History   Marital status: Married    Spouse name: Forensic scientist   Number of children: 2   Years of education: Not on file   Highest education level: Not on file  Occupational History   Occupation: Librarian  Tobacco Use   Smoking status: Never   Smokeless tobacco: Never  Vaping Use   Vaping Use: Never used  Substance and Sexual Activity   Alcohol use: Never   Drug use: Never   Sexual activity: Not Currently    Partners: Male  Other Topics Concern   Not on file  Social History Narrative    12/04/20   From: the area   Living: with husband, Ozzie Hoyle (1994)   Work: Licensed conveyancer at [employer]      Family: 2 children Advertising account executive and Engineer, technical sales - 2 grandchildren - nearby      Enjoys: read      Exercise: trying to get back to exercise   Diet: healthy, limits fast foods      Safety   Seat belts: Yes    Guns: Yes  and secure   Safe in relationships: Yes    Social Determinants of Health   Financial Resource Strain: Low Risk  (04/01/2022)   Overall Financial Resource Strain (CARDIA)    Difficulty of Paying Living Expenses: Not hard at all  Food Insecurity: No Food Insecurity (04/01/2022)   Hunger Vital Sign    Worried About Running Out of Food in the Last Year: Never true    [place] in the Last Year: Never true  Transportation Needs: No Transportation Needs (04/01/2022)   PRAPARE - Hydrologist (Medical): No    Lack of Transportation (Non-Medical): No  Physical Activity: Insufficiently Active (04/01/2022)   Exercise Vital Sign    Days of Exercise per Week: 2 days    Minutes of Exercise per Session: 20 min  Stress: No Stress Concern Present (04/01/2022)   [place]    Feeling of Stress : Only a little  Social Connections: Moderately Integrated (04/01/2022)   Social Connection and Isolation Panel [NHANES]    Frequency of Communication with Friends and Family: More than three times a week    Frequency of Social Gatherings with Friends and Family: More than three times a week    Attends Religious Services: More than 4 times per year    Active Member of [employer] or Organizations: No    Attends Archivist Meetings: Never    Marital Status: Married  Human resources officer Violence: Not At Risk (04/01/2022)   Humiliation, Afraid, Rape, and Kick questionnaire    Fear of Current or Ex-Partner: No    Emotionally Abused: No    Physically Abused: No    Sexually Abused: No    Review of Systems Usually has 1 BM today--even with the lactulose Hasn't gone today     Objective:   Physical Exam Constitutional:      Appearance: Normal appearance.  Pulmonary:     Effort: Pulmonary effort is normal. No respiratory distress.  Abdominal:     General: There is no distension.  Neurological:     Mental Status: She is alert.  Psychiatric:     Comments: Seems to be mentally clear            Assessment & Plan:

## 2022-06-04 ENCOUNTER — Other Ambulatory Visit: Payer: Self-pay | Admitting: Family Medicine

## 2022-06-04 DIAGNOSIS — D708 Other neutropenia: Secondary | ICD-10-CM

## 2022-06-05 ENCOUNTER — Telehealth: Payer: Self-pay | Admitting: Hematology

## 2022-06-05 NOTE — Telephone Encounter (Signed)
Scheduled appt per 7/13 referral. Pt is aware of appt date and time. Pt is aware to arrive 15 mins prior to appt time and to bring and updated insurance card. Pt is aware of appt location.   

## 2022-06-10 ENCOUNTER — Encounter: Payer: Medicare Other | Admitting: Family Medicine

## 2022-06-12 ENCOUNTER — Ambulatory Visit (INDEPENDENT_AMBULATORY_CARE_PROVIDER_SITE_OTHER): Payer: Medicare Other | Admitting: Family Medicine

## 2022-06-12 VITALS — BP 100/52 | HR 54 | Temp 97.0°F | Ht 62.75 in | Wt 152.5 lb

## 2022-06-12 DIAGNOSIS — Z85828 Personal history of other malignant neoplasm of skin: Secondary | ICD-10-CM | POA: Diagnosis not present

## 2022-06-12 DIAGNOSIS — Z0000 Encounter for general adult medical examination without abnormal findings: Secondary | ICD-10-CM

## 2022-06-12 DIAGNOSIS — Z1231 Encounter for screening mammogram for malignant neoplasm of breast: Secondary | ICD-10-CM | POA: Diagnosis not present

## 2022-06-12 NOTE — Progress Notes (Signed)
Annual Exam   Chief Complaint:  Chief Complaint  Patient presents with   Medicare Wellness    Part 2    History of Present Illness:  Ms. [name] is a 71 y.o. No obstetric history on file. who LMP was No LMP recorded. Patient is postmenopausal., presents today for her annual examination.    #Recurrent UTI - virtual visit on 06/01/2022 - took abx  - when they see the lower temperature she ends up in the hospital - has been feeling well off medication - took 3 days and did not do the refill -    Nutrition She does get adequate calcium and Vitamin D in her diet. Diet: eating well, eats chicken, [place], fish, low sodium - high protein diet Exercise: daily activity - used to walk - cataracts have made it hard    Social History   Tobacco Use  Smoking Status Never  Smokeless Tobacco Never   Social History   Substance and Sexual Activity  Alcohol Use Never   Social History   Substance and Sexual Activity  Drug Use Never     General Health Dentist in the last year: No Eye doctor: yes  Safety The patient wears seatbelts: yes.     The patient feels safe at home and in their relationships: yes.   Menstrual:  Symptoms of menopause: no issues  GYN She is not sexually active.   Breast Cancer Screening (Age 11-74):  There is no FH of breast cancer. There is no FH of ovarian cancer. BRCA screening Not Indicated.  Last Mammogram: 08/2021 The patient does want a mammogram this year.    Colon Cancer Screening:  Age 81-75 yo - benefits outweigh the risk. Adults 45-85 yo who have never been screened benefit.  Benefits: 134000 people in 2016 will be diagnosed and 49,000 will die - early detection helps [place]: Complications 2/2 to colonoscopy High Risk (Colonoscopy): genetic disorder (Lynch syndrome or familial adenomatous polyposis), personal hx of IBD, previous adenomatous polyp, or previous colorectal cancer, FamHx start 10 years before the age at diagnosis,  increased in males and black race  Options:  FIT - looks for hemoglobin (blood in the stool) - specific and fairly sensitive - must be done annually Cologuard - looks for DNA and blood - more sensitive - therefore can have more false positives, every 3 years Colonoscopy - every 10 years if normal - sedation, bowl prep, must have someone drive you  Shared decision making and the patient had decided to do colonoscopy - planning to reschedule with Dr. Tarri Glenn.   Social History   Tobacco Use  Smoking Status Never  Smokeless Tobacco Never    Lung Cancer Screening (Ages 35-70): not applicable   Weight Wt Readings from Last 3 Encounters:  06/12/22 152 lb 8 oz (69.2 kg)  05/18/22 167 lb (75.8 kg)  05/01/22 141 lb 15.6 oz (64.4 kg)   Patient has normal BMI  BMI Readings from Last 1 Encounters:  06/12/22 27.23 kg/m     Chronic disease screening Blood pressure monitoring:  BP Readings from Last 3 Encounters:  06/12/22 (!) 100/52  06/01/22 (!) 115/57  05/18/22 118/60    Lipid Monitoring: Indication for screening: age >39, obesity, diabetes, family hx, CV risk factors.  Lipid screening: Yes  Lab Results  Component Value Date   CHOL 159 09/14/2018   HDL 73.60 09/14/2018   LDLCALC 77 09/14/2018   TRIG 83 08/12/2020   CHOLHDL 2 09/14/2018  Diabetes Screening: age >91, overweight, family hx, PCOS, hx of gestational diabetes, at risk ethnicity Diabetes Screening screening: Yes  Lab Results  Component Value Date   HGBA1C 5.1 08/14/2020     Past Medical History:  Diagnosis Date   Acute urinary retention 05/04/2022   Cancer ([place])    cecum   Elevated liver function tests    Esophageal varices (HCC)    Hemorrhage of gastrointestinal tract 05/04/2011   Hypothyroidism    Iron deficiency anemia    Liver disease    chemotherapy complication, per pt, shunts placed to bypass liver   Malignant neoplasm of cecum ([place])    Portal hypertension ([place])    Splenomegaly      Past Surgical History:  Procedure Laterality Date   ESOPHAGEAL VARICE LIGATION     HEMICOLECTOMY  01/08/03   IR RADIOLOGIST EVAL & MGMT  12/20/2020   IR RADIOLOGIST EVAL & MGMT  05/29/2021   LIVER SURGERY     shunts placed after chemo complication   SKIN FULL THICKNESS GRAFT N/A 09/12/2019   Procedure: debridement and FTSG to the nose from left upper arm;  Surgeon: Cindra Presume, MD;  Location: [facility];  Service: Plastics;  Laterality: N/A;  2 hours, please   TIPS PROCEDURE      Prior to Admission medications   Medication Sig Start Date End Date Taking? Authorizing Provider  Ascorbic Acid (VITAMIN C PO) Take 2,000 mg by mouth daily.   Yes [provider]  AYR SALINE NASAL NA Place 1 spray into the nose daily as needed (congestion).   Yes [provider]  Cholecalciferol (VITAMIN D3) 50 MCG (2000 UT) TABS Take 2,000 Units by mouth at bedtime.    Yes [provider]  ELDERBERRY PO Take 15-30 mLs by mouth See admin instructions. Take 15 ml in the morning and 30 ml at night   Yes [provider]  ENULOSE 10 GM/15ML SOLN Take 40 g by mouth 3 (three) times daily. 03/27/22  Yes [provider]  furosemide (LASIX) 20 MG tablet Take 2 tablets (40 mg total) by mouth daily. Hold until you see your liver doctor 05/05/22 05/05/23 Yes Lorella Nimrod, MD  levOCARNitine (CARNITOR) 330 MG tablet Take 330 mg by mouth 3 (three) times daily. 05/23/22  Yes [provider]  Multiple Vitamin (MULTI-VITAMIN) tablet Take 1 tablet by mouth daily.   Yes [provider]  spironolactone (ALDACTONE) 50 MG tablet Take 2 tablets (100 mg total) by mouth daily. Hold until you see your liver doctor 05/05/22 05/05/23 Yes Lorella Nimrod, MD  SYNTHROID 75 MCG tablet Take 1 tablet (75 mcg total) by mouth daily before breakfast. 02/25/22  Yes Lesleigh Noe, MD  zinc gluconate 50 MG tablet Take 50 mg by mouth 3 (three) times daily.   Yes [provider]    Allergies  Allergen Reactions   Contrast Media [Iodinated Contrast Media] Hives    Gynecologic History: No LMP recorded. Patient is postmenopausal.  Obstetric History: No obstetric history on file.  Social History   Socioeconomic History   Marital status: Married    Spouse name: Ozzie Hoyle   Number of children: 2   Years of education: Not on file   Highest education level: Not on file  Occupational History   Occupation: Librarian  Tobacco Use   Smoking status: Never   Smokeless tobacco: Never  Vaping Use   Vaping Use: Never used  Substance and Sexual Activity   Alcohol  use: Never   Drug use: Never   Sexual activity: Not Currently    Partners: Male  Other Topics Concern   Not on file  Social History Narrative   12/04/20   From: the area   Living: with husband, Ozzie Hoyle (1994)   Work: Licensed conveyancer at [employer]      Family: 2 children Colletta [place] and Engineer, technical sales - 2 grandchildren - nearby      Enjoys: read      Exercise: trying to get back to exercise   Diet: healthy, limits fast foods      Safety   Seat belts: Yes    Guns: Yes  and secure   Safe in relationships: Yes    Social Determinants of Health   Financial Resource Strain: Low Risk  (04/01/2022)   Overall Financial Resource Strain (CARDIA)    Difficulty of Paying Living Expenses: Not hard at all  Food Insecurity: No Food Insecurity (04/01/2022)   Hunger Vital Sign    Worried About Running Out of Food in the Last Year: Never true    [place] in the Last Year: Never true  Transportation Needs: No Transportation Needs (04/01/2022)   PRAPARE - Hydrologist (Medical): No    Lack of Transportation (Non-Medical): No  Physical Activity: Insufficiently Active (04/01/2022)   Exercise Vital Sign    Days of Exercise per Week: 2 days    Minutes of Exercise per Session: 20 min  Stress: No Stress Concern Present (04/01/2022)   [place]    Feeling of Stress : Only a little  Social Connections: Moderately Integrated (04/01/2022)   Social Connection and Isolation Panel [NHANES]    Frequency of Communication with Friends and Family: More than three times a week    Frequency of Social Gatherings with Friends and Family: More than three times a week    Attends Religious Services: More than 4 times per year    Active Member of [employer] or Organizations: No    Attends Archivist Meetings: Never    Marital Status: Married  Human resources officer Violence: Not At Risk (04/01/2022)   Humiliation, Afraid, Rape, and Kick questionnaire    Fear of Current or Ex-Partner: No    Emotionally Abused: No    Physically Abused: No    Sexually Abused: No    Family History  Problem Relation Age of Onset   Arthritis Mother    Hearing loss Mother    Heart disease Mother    Hypertension Mother    Arthritis Father    Diabetes Father    Heart disease Father    Breast cancer Neg Hx    Colon cancer Neg Hx    Esophageal cancer Neg Hx    Pancreatic cancer Neg Hx    Stomach cancer Neg Hx     Review of Systems  Constitutional:  Negative for chills and fever.  HENT:  Negative for congestion and sore throat.   Eyes:  Negative for blurred vision and double vision.  Respiratory:  Negative for shortness of breath.   Cardiovascular:  Negative for chest pain.  Gastrointestinal:  Negative for heartburn, nausea and vomiting.  Genitourinary: Negative.   Musculoskeletal: Negative.  Negative for myalgias.  Skin:  Negative for rash.  Neurological:  Negative for dizziness and headaches.  Endo/Heme/Allergies:  Does not bruise/bleed easily.  Psychiatric/Behavioral:  Negative for depression. The patient is not nervous/anxious.  Physical Exam BP (!) 100/52   Pulse (!) 54   Temp (!) 97 F (36.1 C) (Temporal)   Ht 5' 2.75" (1.594 m)   Wt 152 lb 8 oz (69.2 kg)   SpO2 97%   BMI 27.23 kg/m    BP  Readings from Last 3 Encounters:  06/12/22 (!) 100/52  06/01/22 (!) 115/57  05/18/22 118/60      Physical Exam Constitutional:      General: She is not in acute distress.    Appearance: She is well-developed. She is not diaphoretic.  HENT:     Head: Normocephalic and atraumatic.     Right Ear: External ear normal.     Left Ear: External ear normal.     Nose: Nose normal.  Eyes:     General: No scleral icterus.    Extraocular Movements: Extraocular movements intact.     Conjunctiva/sclera: Conjunctivae normal.  Cardiovascular:     Rate and Rhythm: Normal rate and regular rhythm.     Heart sounds: No murmur heard. Pulmonary:     Effort: Pulmonary effort is normal. No respiratory distress.     Breath sounds: Normal breath sounds. No wheezing.  Abdominal:     General: Bowel sounds are normal. There is no distension.     Palpations: Abdomen is soft. There is no mass.     Tenderness: There is no abdominal tenderness. There is no guarding or rebound.  Musculoskeletal:        General: Normal range of motion.     Cervical back: Neck supple.  Lymphadenopathy:     Cervical: No cervical adenopathy.  Skin:    General: Skin is warm and dry.     Capillary Refill: Capillary refill takes less than 2 seconds.  Neurological:     Mental Status: She is alert and oriented to person, place, and time.     Deep Tendon Reflexes: Reflexes normal.  Psychiatric:        Mood and Affect: Mood normal.        Behavior: Behavior normal.     Results:  PHQ-9:  Flowsheet Row Clinical Support from 01/24/2020 in [place] at [facility]  PHQ-9 Total Score 0         Assessment: 71 y.o. No obstetric history on file. female here for routine annual physical examination.  Plan: Problem List Items Addressed This Visit       Musculoskeletal and Integument   History of basal cell cancer   Relevant Orders   Ambulatory referral to Dermatology   Other Visit Diagnoses     Annual physical  exam    -  Primary   Encounter for screening mammogram for malignant neoplasm of breast       Relevant Orders   MM Digital Diagnostic Bilat       Screening: -- Blood pressure screen normal -- cholesterol screening: not due for screening -- Weight screening: normal -- Diabetes Screening: not due for screening -- Nutrition: Encouraged healthy diet  The ASCVD Risk score (Arnett DK, et al., 2019) failed to calculate for the following reasons:   Cannot find a previous HDL lab   Cannot find a previous total cholesterol lab  -- Statin therapy for Age 32-75 with CVD risk >7.5%  Psych -- Depression screening (PHQ-9):  Flowsheet Row Clinical Support from 01/24/2020 in [place] at [facility]  PHQ-9 Total Score 0        Safety -- tobacco screening: not using -- alcohol screening:  low-risk usage. -- no evidence of domestic violence or intimate partner violence.   Cancer Screening -- pap smear not collected per ASCCP guidelines -- family history of breast cancer screening: done. not at high risk. -- Mammogram - ordered -- Colon cancer (age 27+)--  pt will reschedule  Immunizations  There is no immunization history on file for this patient.  -- flu vaccine not in [place] -- TDAP q10 years not up to date - declined -- Shingles (age >84) not up to date - declined -- PPSV-23 (19-64 with chronic disease or smoking) not up to date - declined -- PCV-13 (age >69) - one dose followed by PPSV-23 1 year later not up to date - declined -- Covid-19 Vaccine not up to date - declined   Encouraged healthy diet and exercise. Encouraged regular vision and dental care.    Lesleigh Noe, MD

## 2022-06-12 NOTE — Patient Instructions (Addendum)
[facility] Address: [address], [place], [place] [address] Phone: [phone]  Call your dermatologist for follow-up  Reschedule colonoscopy  See Hematology/oncology next month  Vaccines - Pneumonia 56  -- one year later could get Pneumonia 23 - Shingles - would likely cause flu like symptoms for 2 days - Tdap  Dr. Berniece Pap      [place] Dr. Esther Hardy      [place] Dr. Lyndee Leo Dr. Loralyn [place]      [place] Dr. Carollee Leitz  [facility]   Or [place] [place] and [place]

## 2022-06-18 DIAGNOSIS — H2512 Age-related nuclear cataract, left eye: Secondary | ICD-10-CM | POA: Diagnosis not present

## 2022-07-01 DIAGNOSIS — X32XXXD Exposure to sunlight, subsequent encounter: Secondary | ICD-10-CM | POA: Diagnosis not present

## 2022-07-01 DIAGNOSIS — Z08 Encounter for follow-up examination after completed treatment for malignant neoplasm: Secondary | ICD-10-CM | POA: Diagnosis not present

## 2022-07-01 DIAGNOSIS — L718 Other rosacea: Secondary | ICD-10-CM | POA: Diagnosis not present

## 2022-07-01 DIAGNOSIS — L570 Actinic keratosis: Secondary | ICD-10-CM | POA: Diagnosis not present

## 2022-07-01 DIAGNOSIS — Z85828 Personal history of other malignant neoplasm of skin: Secondary | ICD-10-CM | POA: Diagnosis not present

## 2022-07-10 ENCOUNTER — Encounter: Payer: Self-pay | Admitting: Hematology

## 2022-07-10 ENCOUNTER — Inpatient Hospital Stay: Payer: Medicare Other | Attending: Hematology | Admitting: Hematology

## 2022-07-10 VITALS — BP 130/71 | HR 59 | Resp 18 | Wt 161.1 lb

## 2022-07-10 DIAGNOSIS — Z85038 Personal history of other malignant neoplasm of large intestine: Secondary | ICD-10-CM | POA: Diagnosis not present

## 2022-07-10 DIAGNOSIS — Z79890 Hormone replacement therapy (postmenopausal): Secondary | ICD-10-CM | POA: Insufficient documentation

## 2022-07-10 DIAGNOSIS — D509 Iron deficiency anemia, unspecified: Secondary | ICD-10-CM | POA: Insufficient documentation

## 2022-07-10 DIAGNOSIS — Z79899 Other long term (current) drug therapy: Secondary | ICD-10-CM | POA: Diagnosis not present

## 2022-07-10 DIAGNOSIS — Z85828 Personal history of other malignant neoplasm of skin: Secondary | ICD-10-CM | POA: Diagnosis not present

## 2022-07-10 DIAGNOSIS — R161 Splenomegaly, not elsewhere classified: Secondary | ICD-10-CM | POA: Insufficient documentation

## 2022-07-10 DIAGNOSIS — I10 Essential (primary) hypertension: Secondary | ICD-10-CM | POA: Diagnosis not present

## 2022-07-10 DIAGNOSIS — Z8616 Personal history of COVID-19: Secondary | ICD-10-CM | POA: Diagnosis not present

## 2022-07-10 DIAGNOSIS — D61818 Other pancytopenia: Secondary | ICD-10-CM | POA: Insufficient documentation

## 2022-07-10 DIAGNOSIS — K7460 Unspecified cirrhosis of liver: Secondary | ICD-10-CM | POA: Diagnosis not present

## 2022-07-10 DIAGNOSIS — E039 Hypothyroidism, unspecified: Secondary | ICD-10-CM | POA: Insufficient documentation

## 2022-07-10 DIAGNOSIS — I272 Pulmonary hypertension, unspecified: Secondary | ICD-10-CM | POA: Insufficient documentation

## 2022-07-10 NOTE — Progress Notes (Signed)
[place]   [phone] [phone] [place] Note   Patient Care Team: Lesleigh Noe, MD as PCP - General (Family Medicine) Thornton Park, MD as Consulting Physician (Gastroenterology) Roosevelt Locks, [place] as Nurse Practitioner (Nurse Practitioner) Charlton Haws, [facility] as Pharmacist (Pharmacist)  Date of Service:  07/11/2022   CHIEF COMPLAINTS/PURPOSE OF CONSULTATION:  Pancytopenia  REFERRING PHYSICIAN:  Dr. Waunita Schooner  ASSESSMENT & PLAN:  [name] is a 71 y.o. female with  1. Pancytopenia -secondary to hypersplenism -most recent CBC from 06/01/22 showed-- WBC 2.4, hgb 11.9, plt 134k. -chart review shows her labs fluctuate, which her husband attributes to when she gets a UTI. -I discussed her recent labs and compared to prior levels. I feel her pancytopenia is mild and does not require treatment at this time. However, I recommend repeat lab next week with more comprehensive work up. I will call her with the results. If there are any worrisome findings, we will discuss potential treatment options.*** -f/u in 6 months  2. Cecal cancer, stage IIIB, p(T3, N1) -diagnosed 2004, s/p hemicolectomy 01/08/03 showing 4 cm moderately differentiated adenocarcinoma with matted lymph nodes, treated with 12 cycles of adjuvant FOLFOX 02/05/03 - 09/24/03.  3. Liver cirrhosis -felt to be secondary to oxaliplatin -prior liver biopsies in 03/2009 showed hepatic sinusoidal obstruction -h/o variceal bleeding, s/p TIPS 05/05/11 at [facility]. -last MRCP 06/09/21 showed cirrhosis without suspicion for [place]. -followed by NP Roosevelt Locks, not felt to be a candidate for transplant. I recommend they discuss this with her again, especially given she is almost 20 years out from cancer diagnosis.   PLAN:  -lab next week  -will call with results -lab and f/u in 6 months   HISTORY OF PRESENTING ILLNESS:  [name] 71 y.o. female is a here because  of pancytopenia. The patient was referred by Dr. Einar Pheasant. The patient presents to the clinic today accompanied by her husband.   Her husband reports her health shifted after she contracted Covid in late 2021. Following this, she developed hepatic encephalopathy.   She has a PMHx of.... -liver cirrhosis -colon cancer  Socially... 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   REVIEW OF SYSTEMS:    Constitutional: Denies fevers, chills or abnormal night sweats Eyes: Denies blurriness of vision, double vision or watery eyes Ears, nose, mouth, throat, and face: Denies mucositis or sore throat Respiratory: Denies cough, dyspnea or wheezes Cardiovascular: Denies palpitation, chest discomfort or lower extremity swelling Gastrointestinal:  Denies nausea, heartburn or change in bowel habits Skin: Denies abnormal skin rashes Lymphatics: Denies new lymphadenopathy or easy bruising Neurological:Denies numbness, tingling or new weaknesses Behavioral/Psych: Mood is stable, no new changes  All other systems were reviewed with the patient and are negative.   MEDICAL HISTORY:  Past Medical History:  Diagnosis Date   Acute urinary retention 05/04/2022   Allergy 2006 ?   Contrast dye   Arthritis 2016 ??   Knees and thumb   Cancer [facility])    cecum   Cataract 2021   Surgery scheduled July 2023   Colon cancer [facility]) 2003   Elevated liver function tests    Esophageal varices (HCC)    Heart murmur On file   Hemorrhage of gastrointestinal tract 05/04/2011   Hypertension 2021   Hypothyroidism    Iron deficiency anemia    Liver disease    chemotherapy complication, per pt, shunts placed to bypass liver   Malignant neoplasm of cecum (  [place])    Portal hypertension ([place])    Skin cancer 2019   Splenomegaly     SURGICAL HISTORY: Past Surgical History:  Procedure Laterality Date   COLON SURGERY  2004   Cancer   COSMETIC SURGERY  2021   Skin cancer   ESOPHAGEAL VARICE LIGATION     EYE SURGERY      HEMICOLECTOMY  01/08/2003   IR RADIOLOGIST EVAL & MGMT  12/20/2020   IR RADIOLOGIST EVAL & MGMT  05/29/2021   LIVER SURGERY     shunts placed after chemo complication   SKIN FULL THICKNESS GRAFT N/A 09/12/2019   Procedure: debridement and FTSG to the nose from left upper arm;  Surgeon: Cindra Presume, MD;  Location: [place];  Service: Plastics;  Laterality: N/A;  2 hours, please   TIPS PROCEDURE      SOCIAL HISTORY: Social History   Socioeconomic History   Marital status: Married    Spouse name: Forensic scientist   Number of children: 2   Years of education: Not on file   Highest education level: Not on file  Occupational History   Occupation: Librarian  Tobacco Use   Smoking status: Never   Smokeless tobacco: Never   Tobacco comments:    Never smoked  Vaping Use   Vaping Use: Never used  Substance and Sexual Activity   Alcohol use: Never   Drug use: Never   Sexual activity: Not Currently    Partners: Male  Other Topics Concern   Not on file  Social History Narrative   12/04/20   From: the area   Living: with husband, Ozzie Hoyle (1994)   Work: Licensed conveyancer at [employer]      Family: 2 children Advertising account executive and Engineer, technical sales - 2 grandchildren - nearby      Enjoys: read      Exercise: trying to get back to exercise   Diet: healthy, limits fast foods      Safety   Seat belts: Yes    Guns: Yes  and secure   Safe in relationships: Yes    Social Determinants of Health   Financial Resource Strain: Low Risk  (04/01/2022)   Overall Financial Resource Strain (CARDIA)    Difficulty of Paying Living Expenses: Not hard at all  Food Insecurity: No Food Insecurity (04/01/2022)   Hunger Vital Sign    Worried About Running Out of Food in the Last Year: Never true    [place] in the Last Year: Never true  Transportation Needs: No Transportation Needs (04/01/2022)   PRAPARE - Hydrologist (Medical): No    Lack of Transportation  (Non-Medical): No  Physical Activity: Insufficiently Active (04/01/2022)   Exercise Vital Sign    Days of Exercise per Week: 2 days    Minutes of Exercise per Session: 20 min  Stress: No Stress Concern Present (04/01/2022)   [place]    Feeling of Stress : Only a little  Social Connections: Moderately Integrated (04/01/2022)   Social Connection and Isolation Panel [NHANES]    Frequency of Communication with Friends and Family: More than three times a week    Frequency of Social Gatherings with Friends and Family: More than three times a week    Attends Religious Services: More than 4 times per year    Active Member of [employer] or Organizations: No    Attends Archivist Meetings: Never  Marital Status: Married  Human resources officer Violence: Not At Risk (04/01/2022)   Humiliation, Afraid, Rape, and Kick questionnaire    Fear of Current or Ex-Partner: No    Emotionally Abused: No    Physically Abused: No    Sexually Abused: No    FAMILY HISTORY: Family History  Problem Relation Age of Onset   Arthritis Mother    Hearing loss Mother    Heart disease Mother    Hypertension Mother    Miscarriages / [place] Mother    Arthritis Father    Diabetes Father    Heart disease Father    Breast cancer Neg Hx    Colon cancer Neg Hx    Esophageal cancer Neg Hx    Pancreatic cancer Neg Hx    Stomach cancer Neg Hx     ALLERGIES:  is allergic to contrast media [iodinated contrast media].  MEDICATIONS:  Current Outpatient Medications  Medication Sig Dispense Refill   Ascorbic Acid (VITAMIN C PO) Take 2,000 mg by mouth daily.     AYR SALINE NASAL NA Place 1 spray into the nose daily as needed (congestion).     Cholecalciferol (VITAMIN D3) 50 MCG (2000 UT) TABS Take 2,000 Units by mouth at bedtime.      ELDERBERRY PO Take 15-30 mLs by mouth See admin instructions. Take 15 ml in the morning and 30 ml at night      ENULOSE 10 GM/15ML SOLN Take 40 g by mouth 3 (three) times daily.     furosemide (LASIX) 20 MG tablet Take 2 tablets (40 mg total) by mouth daily. Hold until you see your liver doctor 30 tablet    levOCARNitine (CARNITOR) 330 MG tablet Take 330 mg by mouth 3 (three) times daily.     Multiple Vitamin (MULTI-VITAMIN) tablet Take 1 tablet by mouth daily.     spironolactone (ALDACTONE) 50 MG tablet Take 2 tablets (100 mg total) by mouth daily. Hold until you see your liver doctor 60 tablet 11   SYNTHROID 75 MCG tablet Take 1 tablet (75 mcg total) by mouth daily before breakfast. 90 tablet 1   zinc gluconate 50 MG tablet Take 50 mg by mouth 3 (three) times daily.     No current facility-administered medications for this visit.    PHYSICAL EXAMINATION: ECOG PERFORMANCE STATUS: {CHL ONC ECOG IR:[id number]}  Vitals:   07/10/22 1550  BP: 130/71  Pulse: (!) 59  Resp: 18  SpO2: 97%   Filed Weights   07/10/22 1550  Weight: 161 lb 2 oz (73.1 kg)    GENERAL:alert, no distress and comfortable SKIN: skin color, texture, turgor are normal, no rashes or significant lesions EYES: normal, Conjunctiva are pink and non-injected, sclera clear  NECK: supple, thyroid normal size, non-tender, without nodularity LYMPH:  no palpable lymphadenopathy in the cervical, axillary  LUNGS: clear to auscultation and percussion with normal breathing effort HEART: regular rate & rhythm and no murmurs and no lower extremity edema ABDOMEN: soft, non-tender, (+) spleen is mildly enlarged Musculoskeletal:no cyanosis of digits and no clubbing  NEURO: alert & oriented x 3 with fluent speech, no focal motor/sensory deficits  LABORATORY DATA:  I have reviewed the data as listed    Latest Ref Rng & Units 06/01/2022    2:00 PM 05/18/2022    9:06 AM 05/04/2022    3:58 AM  CBC  WBC 4.0 - 10.5 K/uL 2.4 Repeated and verified X2.  2.2 Repeated and verified X2.  3.6   Hemoglobin 12.0 -  15.0 g/dL 11.9  11.1  11.7   Hematocrit  36.0 - 46.0 % 36.2  33.1  36.4   Platelets 150.0 - 400.0 K/uL 134.0  145.0  96        Latest Ref Rng & Units 06/01/2022    2:00 PM 05/18/2022    9:06 AM 05/05/2022    9:51 AM  CMP  Glucose 70 - 99 mg/dL 82  80  80   BUN 6 - 23 mg/dL '19  17  16   '$ Creatinine 0.40 - 1.20 mg/dL 0.95  0.77  0.75   Sodium 135 - 145 mEq/L 142  142  138   Potassium 3.5 - 5.1 mEq/L 3.6  4.1  3.2   Chloride 96 - 112 mEq/L 104  107  106   CO2 19 - 32 mEq/L 34  28  24   Calcium 8.4 - 10.5 mg/dL 9.5  9.0  8.7   Total Protein 6.0 - 8.3 g/dL  5.6    Total Bilirubin 0.2 - 1.2 mg/dL  0.9    Alkaline Phos 39 - 117 U/L  202    AST 0 - 37 U/L  83    ALT 0 - 35 U/L  60       RADIOGRAPHIC STUDIES: I have personally reviewed the radiological images as listed and agreed with the findings in the report. No results found.   Orders Placed This Encounter  Procedures   CBC with Differential/Platelet    Standing Status:   Standing    Number of Occurrences:   50    Standing Expiration Date:   07/11/2023   Comprehensive metabolic panel    Standing Status:   Standing    Number of Occurrences:   50    Standing Expiration Date:   07/11/2023   Methylmalonic acid, serum    Standing Status:   Future    Standing Expiration Date:   07/10/2023   Retic Panel    Standing Status:   Future    Standing Expiration Date:   07/11/2023   Immature Platelet Fraction    Standing Status:   Future    Standing Expiration Date:   07/11/2023   Folate, Serum    Standing Status:   Future    Standing Expiration Date:   07/10/2023   Save Smear for Provider Slide Review    Standing Status:   Future    Standing Expiration Date:   07/11/2023    All questions were answered. The patient knows to call the clinic with any problems, questions or concerns. The total time spent in the appointment was {CHL ONC TIME VISIT - FKCLE:[id number]}.     Truitt Merle, MD 07/11/2022 12:02 AM  I, Wilburn Mylar, am acting as scribe for Truitt Merle, MD.   {Add  scribe attestation statement}

## 2022-07-11 ENCOUNTER — Encounter: Payer: Self-pay | Admitting: Hematology

## 2022-08-05 ENCOUNTER — Telehealth: Payer: Self-pay | Admitting: *Deleted

## 2022-08-05 NOTE — Patient Outreach (Signed)
  Care Coordination   Initial Visit Note   08/05/2022 Name: [name] MRN: [id number] DOB: 1951/04/06  [name] is a 71 y.o. year old female who sees [name], Katherine Marker, MD for primary care. I spoke with  Annetta Maw by phone today.  What matters to the patients health and wellness today?  No concerns expressed. Patient has had AWV. RN discussed services [facility] services, RN, SW, and Pharmacist. Patient declined services.     Goals Addressed             This Visit's Progress    Advised patient to contact PCP office to schedule vaccines          SDOH assessments and interventions completed:  Yes     Care Coordination Interventions Activated:  Yes  Care Coordination Interventions:  No, not indicated   Follow up plan: No further intervention required.   Encounter Outcome:  Pt. [facility] Care Management [phone]

## 2022-08-26 ENCOUNTER — Ambulatory Visit (INDEPENDENT_AMBULATORY_CARE_PROVIDER_SITE_OTHER): Payer: Medicare Other | Admitting: Nurse Practitioner

## 2022-08-26 VITALS — BP 122/66 | HR 70 | Temp 96.8°F | Resp 14 | Ht 62.75 in | Wt 161.2 lb

## 2022-08-26 DIAGNOSIS — N3001 Acute cystitis with hematuria: Secondary | ICD-10-CM | POA: Insufficient documentation

## 2022-08-26 DIAGNOSIS — R410 Disorientation, unspecified: Secondary | ICD-10-CM | POA: Diagnosis not present

## 2022-08-26 DIAGNOSIS — N3090 Cystitis, unspecified without hematuria: Secondary | ICD-10-CM

## 2022-08-26 DIAGNOSIS — N390 Urinary tract infection, site not specified: Secondary | ICD-10-CM

## 2022-08-26 LAB — POC URINALSYSI DIPSTICK (AUTOMATED)
Bilirubin, UA: NEGATIVE
Glucose, UA: NEGATIVE
Ketones, UA: NEGATIVE
Nitrite, UA: NEGATIVE
Protein, UA: NEGATIVE
Spec Grav, UA: 1.015 (ref 1.010–1.025)
Urobilinogen, UA: 0.2 E.U./dL
pH, UA: 6 (ref 5.0–8.0)

## 2022-08-26 MED ORDER — CEPHALEXIN 500 MG PO CAPS: 500.0000 mg | ORAL_CAPSULE | Freq: Two times a day (BID) | ORAL | 0 refills | Status: DC

## 2022-08-26 MED ORDER — CEPHALEXIN 500 MG PO CAPS: 500.0000 mg | ORAL_CAPSULE | Freq: Two times a day (BID) | ORAL | 0 refills | Status: AC

## 2022-08-26 NOTE — Progress Notes (Signed)
Acute Office Visit  Subjective:     Patient ID: [name], female    DOB: 19-Oct-1951, 71 y.o.   MRN: [id number]  Chief Complaint  Patient presents with   memory issue    Gets hot/sweats started yesterday, trouble with thinking/confusion    HPI Patient is in today for memory issue  Patient presents with  a history of esophageal varices, portal hypertension, decompensated liver cirrhosis with portal htn, hepatic encephalopathy.  Patient's spouse is at bedside who helps with history and HPI  Symptoms started yesterday with getting hot and having sweats. States that she also has had a loss of memory and a difficult time figuring out which hand to use with her cane.  Patient spouse at bedside states patient at the bedside drinking water while at work because of [employer] where she works.  We before they were at the beach and she did not also drink enough water due to being on vacation.  Patient has a history of recurrent UTIs she was recently in the hospital for the same with 1 that cultured out for E. coli and Enterococcus.  Review of Systems  Constitutional:  Positive for chills. Negative for fever.       Decreased fluid intake per spouse   Gastrointestinal:  Negative for abdominal pain, nausea and vomiting.  Genitourinary:  Negative for dysuria and frequency.  Neurological:  Negative for tingling, focal weakness, weakness and headaches.  Psychiatric/Behavioral:  Positive for memory loss.         Objective:    BP 122/66   Pulse 70   Temp (!) 96.8 F (36 C) (Temporal)   Resp 14   Ht 5' 2.75" (1.594 m)   Wt 161 lb 4 oz (73.1 kg)   SpO2 97%   BMI 28.79 kg/m    Physical Exam Vitals and nursing note reviewed.  Constitutional:      Appearance: Normal appearance.  Cardiovascular:     Rate and Rhythm: Normal rate and regular rhythm.     Heart sounds: Normal heart sounds.  Pulmonary:     Breath sounds: Normal breath sounds.  Abdominal:     General: Bowel  sounds are normal. There is no distension.     Palpations: There is no mass.     Tenderness: There is no abdominal tenderness. There is no right CVA tenderness or left CVA tenderness.     Hernia: No hernia is present.  Neurological:     Mental Status: She is alert.     Results for orders placed or performed in visit on 08/26/22  POCT Urinalysis Dipstick (Automated)  Result Value Ref Range   Color, UA yellow    Clarity, UA hazy    Glucose, UA Negative Negative   Bilirubin, UA negative    Ketones, UA negative    Spec Grav, UA 1.015 1.010 - 1.025   Blood, UA trace    pH, UA 6.0 5.0 - 8.0   Protein, UA Negative Negative   Urobilinogen, UA 0.2 0.2 or 1.0 E.U./dL   Nitrite, UA negative    Leukocytes, UA Large (3+) (A) Negative        Assessment & Plan:   Problem List Items Addressed This Visit       Nervous and Auditory   Confusion - Primary    Patient presents with confusion when she normally gets a urinary tract infection.  Given UA is indicative of urinary tract infection will elect to treat.  Gave strict signs  and symptoms of confusion gets worse or her mental status becomes altered to seek emergency room help as she does have a history of decompensated liver cirrhosis patient is on lactulose taking as prescribed      Relevant Orders   POCT Urinalysis Dipstick (Automated) (Completed)   Urine Culture     Genitourinary   Recurrent UTI    History of same has been referred to urology for previous office notes.      Relevant Medications   cephALEXin (KEFLEX) 500 MG capsule   Cystitis    Given large leuks and hematuria we will treat with Keflex 500 mg twice daily for 7 days.  Pending urine culture.  Strict precautions reviewed when to seek emergency help      Relevant Medications   cephALEXin (KEFLEX) 500 MG capsule    Meds ordered this encounter  Medications   DISCONTD: cephALEXin (KEFLEX) 500 MG capsule    Sig: Take 1 capsule (500 mg total) by mouth 2 (two) times  daily for 7 days.    Dispense:  14 capsule    Refill:  0    Order Specific Question:   Supervising Provider    Answer:   Loura Pardon A [[id number]]   cephALEXin (KEFLEX) 500 MG capsule    Sig: Take 1 capsule (500 mg total) by mouth 2 (two) times daily for 7 days.    Dispense:  14 capsule    Refill:  0    Order Specific Question:   Supervising Provider    Answer:   TOWER, MARNE A [[id number]]    Return if symptoms worsen or fail to improve.  Romilda Garret, NP

## 2022-08-26 NOTE — Patient Instructions (Signed)
Nice to see you today Sent in the antibiotic to the pharmacy Follow up if you do not improve.  I will be in touch with the urine culture once I have it

## 2022-08-26 NOTE — Assessment & Plan Note (Signed)
Patient presents with confusion when she normally gets a urinary tract infection.  Given UA is indicative of urinary tract infection will elect to treat.  Gave strict signs and symptoms of confusion gets worse or her mental status becomes altered to seek emergency room help as she does have a history of decompensated liver cirrhosis patient is on lactulose taking as prescribed

## 2022-08-26 NOTE — Assessment & Plan Note (Signed)
Given large leuks and hematuria we will treat with Keflex 500 mg twice daily for 7 days.  Pending urine culture.  Strict precautions reviewed when to seek emergency help

## 2022-08-26 NOTE — Assessment & Plan Note (Signed)
History of same has been referred to urology for previous office notes.

## 2022-08-28 ENCOUNTER — Telehealth: Payer: Self-pay | Admitting: Family Medicine

## 2022-08-28 NOTE — Telephone Encounter (Signed)
I need to get the urine culture back prior to making changes

## 2022-08-28 NOTE — Telephone Encounter (Signed)
Pt's husband called stating pt's uti is getting worse & the meds, cephALEXin (KEFLEX) 500 MG capsule doesn't seem to be working, at all. Pt's husband is requesting for another antibiotic to be sent in, so the pt won't have to go through the weekend with the irritation? Pt's husband is requesting a call back to his # @ [phone] due to pt being at work today.

## 2022-08-28 NOTE — Telephone Encounter (Signed)
Spoke with [employer] - the final urine culture result may not be in for another 24 to 48 hours, the soonest maybe late this evening.

## 2022-08-28 NOTE — Telephone Encounter (Signed)
Mr Touchette advised. Patient struggled to use her cane today but did not have trouble yesterday. Not sure if she is doing stable or if this means worse.

## 2022-08-28 NOTE — Telephone Encounter (Signed)
Urine Culture is not resulted all the way yet, waiting to hear back from the lab to see when it will be available so we can start the right antibiotic for the patient.

## 2022-08-29 LAB — URINE CULTURE
MICRO NUMBER:: 14007085
SPECIMEN QUALITY:: ADEQUATE

## 2022-08-31 ENCOUNTER — Telehealth: Payer: Self-pay

## 2022-08-31 ENCOUNTER — Other Ambulatory Visit: Payer: Self-pay | Admitting: Nurse Practitioner

## 2022-08-31 DIAGNOSIS — N390 Urinary tract infection, site not specified: Secondary | ICD-10-CM

## 2022-08-31 MED ORDER — NITROFURANTOIN MONOHYD MACRO 100 MG PO CAPS: 100.0000 mg | ORAL_CAPSULE | Freq: Two times a day (BID) | ORAL | 0 refills | Status: AC

## 2022-08-31 NOTE — Chronic Care Management (AMB) (Signed)
    Chronic Care Management Pharmacy Assistant   Name: Cristella Stiver  MRN: [id number] DOB: 1951-04-11   Reason for Encounter: Reminder Call   Medications: Outpatient Encounter Medications as of 08/31/2022  Medication Sig   Ascorbic Acid (VITAMIN C PO) Take 2,000 mg by mouth daily.   AYR SALINE NASAL NA Place 1 spray into the nose daily as needed (congestion).   cephALEXin (KEFLEX) 500 MG capsule Take 1 capsule (500 mg total) by mouth 2 (two) times daily for 7 days.   Cholecalciferol (VITAMIN D3) 50 MCG (2000 UT) TABS Take 2,000 Units by mouth at bedtime.    ELDERBERRY PO Take 15-30 mLs by mouth See admin instructions. Take 15 ml in the morning and 30 ml at night   ENULOSE 10 GM/15ML SOLN Take 40 g by mouth 3 (three) times daily.   furosemide (LASIX) 20 MG tablet Take 2 tablets (40 mg total) by mouth daily. Hold until you see your liver doctor   levOCARNitine (CARNITOR) 330 MG tablet Take 330 mg by mouth 3 (three) times daily.   Multiple Vitamin (MULTI-VITAMIN) tablet Take 1 tablet by mouth daily.   nitrofurantoin, macrocrystal-monohydrate, (MACROBID) 100 MG capsule Take 1 capsule (100 mg total) by mouth 2 (two) times daily for 5 days.   spironolactone (ALDACTONE) 50 MG tablet Take 2 tablets (100 mg total) by mouth daily. Hold until you see your liver doctor   SYNTHROID 75 MCG tablet Take 1 tablet (75 mcg total) by mouth daily before breakfast.   zinc gluconate 50 MG tablet Take 50 mg by mouth 3 (three) times daily.   No facility-administered encounter medications on file as of 08/31/2022.   Malee Grays was contacted to remind of upcoming telephone visit with Charlene Brooke on 09/03/22 at 3:00. Patient was reminded to have any blood glucose and blood pressure readings available for review at appointment.   Message was left reminding patient of appointment.  CCM referral has been placed prior to visit?  No   Star Rating Drugs: Medication:  Last Fill: Day Supply No star  medications identified  Charlene Brooke, CPP notified  Avel Sensor, [place]  [phone]

## 2022-08-31 NOTE — Telephone Encounter (Signed)
See mychart message in regards to urine culture

## 2022-09-03 ENCOUNTER — Telehealth: Payer: Self-pay | Admitting: Family Medicine

## 2022-09-03 ENCOUNTER — Telehealth: Payer: Medicare Other

## 2022-09-03 DIAGNOSIS — E039 Hypothyroidism, unspecified: Secondary | ICD-10-CM

## 2022-09-03 NOTE — Progress Notes (Deleted)
Chronic Care Management Pharmacy Note  09/03/2022 Name:  [name] MRN:  [id number] DOB:  17-May-1951  Summary: CCM F/U visit -Pt stopped taking losartan almost 2 weeks ago due to hypotension and BP has been generally < 140/90 at home since. GI recently stopped spironolactone and Lasix due to declining kidney function as well. -Pt reports improvement in encephalopathy symptoms since increasing protein in diet  Recommendations/Changes made from today's visit: -Discontinued losartan from med list. Advised continued daily monitoring of BP. Advised to call if SBP consistently > 140. -Advised to follow up with GI as scheduled  Plan: -[place] will call patient in 1 month for BP log -Pharmacist follow up televisit scheduled for 6 months -PCP F/U 05/04/22   Subjective: [name] is an 71 y.o. year old female who is a primary patient of Katherine Schooner, MD.  The CCM team was consulted for assistance with disease management and care coordination needs.    Engaged with patient by telephone for follow up visit in response to provider referral for pharmacy case management and/or care coordination services.   Consent to Services:  The patient was given information about Chronic Care Management services, agreed to services, and gave verbal consent prior to initiation of services.  Please see initial visit note for detailed documentation.   Patient Care Team: Katherine Schooner, MD as PCP - General (Family Medicine) Thornton Park, MD as Consulting Physician (Gastroenterology) Roosevelt Locks, [place] as Nurse Practitioner (Nurse Practitioner) Charlton Haws, [facility] as Pharmacist (Pharmacist)  Recent office visits: 08/26/22 NP Romilda Garret OV: memory issue - UTI. Rx Keflex. Has been referred to urology. Changed to Macrobid - Ucx enterococcus  06/12/22 Dr Einar Pheasant OV: annual - no changes  06/01/22 Dr Silvio Pate VV: abdominal fullness - UTI. Rx'd amoxil x 3 days  05/18/22 Dr Einar Pheasant OV:  hospital f/u - stable BP, restart Lasix 40 mg. Can restart spiro if edema persists. Refer to urology for recurrent UTI vs colonization. Repeat CBC in 1 week before referring to hematology for neutropenia. F/U CBC was similar so referred to hematology.  02/09/22 Dr Einar Pheasant OV: hospital f/u - kidney function down, likely dehydration. No med changes.  01/30/2021 - Katherine Schooner, MD - Patient presented for Annual Wellness Visit. Stop due to medication cost: XIFAXAN 550 MG TABS tablet.   Recent consult visits: 07/10/22 Dr Burr Medico (Hematology): pancytopenia - likely 2/2 liver cirrhosis. No tx required. Repeat labwork next week.   05/20/22 NP Dawn Drazek OV (Liver/Transplant): continue lactulose with goal of 3-4 BM per day. Ordered Xifaxan w/ tier exception. Consider adding BCAA and LOLA supplements.  03/31/22 NP [place] phone note - ordered lasix 20 mg and spironolactone 50 mg. Labs in 1 week. 03/06/22 repeat BMP - kidney fxn improved. Stay off diuretics. 02/20/22 Dr Tarri Glenn (GI): hospital f/u (cirrhosis, encephalopathy): Continue Lactulose 45 ml TID. Provided PAP for Xifaxan. Cr/BUN increased, stop Lasix and Spironolactone. 12/23/21 Dr Tarri Glenn (GI): colonoscopy recommended 11/10/2021 - Roosevelt Locks, NP - [facility] - Patient presented for other cirrhosis of liver, hepatic encephalopathy and secondary esophageal varices without bleeding and weight loss. Procedure: US Abdomen Limited. Pt is poor candidate for transplant d/t hx of colon cancer and age. Rec'd labs, MELD evaluation and colonoscopy.   Hospital visits: 04/30/22 - 05/05/22 Admission ([facility]): Hepatic encephalopathy and UTI. Ammonia 148 after n/v episode. Received lactulose enema. Developed acute urinary retention requriing foley. Lasix/spiro held during admission and at discharge. Borderline soft BP. Discharged on Bactrim.  02/02/22 - 02/05/22 Admission ([facility]): Hepatic encephalopathy. Started Lasix, spiro, lactulose and reduced losartan to 50  mg.   Objective:  Lab Results  Component Value Date   CREATININE 0.95 06/01/2022   BUN 19 06/01/2022   GFR 60.49 06/01/2022   GFRNONAA >60 05/05/2022   GFRAA >60 08/17/2020   NA 142 06/01/2022   K 3.6 06/01/2022   CALCIUM 9.5 06/01/2022   CO2 34 (H) 06/01/2022   GLUCOSE 82 06/01/2022    Lab Results  Component Value Date/Time   HGBA1C 5.1 08/14/2020 07:45 AM   GFR 60.49 06/01/2022 02:00 PM   GFR 77.85 05/18/2022 09:06 AM    Last diabetic Eye exam: No results found for: "HMDIABEYEEXA"  Last diabetic Foot exam: No results found for: "HMDIABFOOTEX"   Lab Results  Component Value Date   CHOL 159 09/14/2018   HDL 73.60 09/14/2018   LDLCALC 77 09/14/2018   TRIG 83 08/12/2020   CHOLHDL 2 09/14/2018       Latest Ref Rng & Units 05/18/2022    9:06 AM 05/03/2022    4:32 AM 05/02/2022    4:51 AM  Hepatic Function  Total Protein 6.0 - 8.3 g/dL 5.6  6.4  6.7   Albumin 3.5 - 5.2 g/dL 2.9  2.8  2.9   AST 0 - 37 U/L 83  72  90   ALT 0 - 35 U/L 60  59  72   Alk Phosphatase 39 - 117 U/L 202  145  159   Total Bilirubin 0.2 - 1.2 mg/dL 0.9  1.6  2.3     Lab Results  Component Value Date/Time   TSH 1.62 05/18/2022 09:06 AM   TSH 0.99 01/15/2021 03:31 PM   FREET4 0.88 01/15/2021 03:31 PM   FREET4 0.79 08/01/2020 04:36 PM       Latest Ref Rng & Units 06/01/2022    2:00 PM 05/18/2022    9:06 AM 05/04/2022    3:58 AM  CBC  WBC 4.0 - 10.5 K/uL 2.4 Repeated and verified X2.  2.2 Repeated and verified X2.  3.6   Hemoglobin 12.0 - 15.0 g/dL 11.9  11.1  11.7   Hematocrit 36.0 - 46.0 % 36.2  33.1  36.4   Platelets 150.0 - 400.0 K/uL 134.0  145.0  96     Lab Results  Component Value Date/Time   VD25OH 53.64 09/14/2018 04:35 PM    Clinical ASCVD: No  The ASCVD Risk score (Arnett DK, et al., 2019) failed to calculate for the following reasons:   Cannot find a previous HDL lab   Cannot find a previous total cholesterol lab       04/01/2022    3:09 PM 02/09/2022    2:07 PM  01/30/2021    3:42 PM  Depression screen PHQ 2/9  Decreased Interest 0 0 0  Down, Depressed, Hopeless 0 0 0  PHQ - 2 Score 0 0 0     Social History   Tobacco Use  Smoking Status Never  Smokeless Tobacco Never  Tobacco Comments   Never smoked   BP Readings from Last 3 Encounters:  08/26/22 122/66  07/10/22 130/71  06/12/22 (!) 100/52   Pulse Readings from Last 3 Encounters:  08/26/22 70  07/10/22 (!) 59  06/12/22 (!) 54   Wt Readings from Last 3 Encounters:  08/26/22 161 lb 4 oz (73.1 kg)  07/10/22 161 lb 2 oz (73.1 kg)  06/12/22 152 lb 8 oz (69.2 kg)   BMI Readings  from Last 3 Encounters:  08/26/22 28.79 kg/m  07/10/22 28.77 kg/m  06/12/22 27.23 kg/m    Assessment/Interventions: Review of patient past medical history, allergies, medications, health status, including review of consultants reports, laboratory and other test data, was performed as part of comprehensive evaluation and provision of chronic care management services.   SDOH:  (Social Determinants of Health) assessments and interventions performed: Yes SDOH Interventions    Flowsheet Row Clinical Support from 04/01/2022 in [place] [facility] [place] from 01/24/2020 in [place] [facility] Interventions Intervention Not Indicated --  Housing Interventions Intervention Not Indicated --  Transportation Interventions Intervention Not Indicated --  Depression Interventions/Treatment  -- PHQ2-9 Score <4 Follow-up Not Indicated  Financial Strain Interventions Intervention Not Indicated --  Physical Activity Interventions Intervention Not Indicated --  Stress Interventions Intervention Not Indicated --  Social Connections Interventions Intervention Not Indicated --      SDOH Screenings   Food Insecurity: No Food Insecurity (04/01/2022)  Housing: Low Risk  (04/01/2022)  Transportation Needs: No Transportation Needs (04/01/2022)   Alcohol Screen: Low Risk  (04/01/2022)  Depression (PHQ2-9): Low Risk  (04/01/2022)  Financial Resource Strain: Low Risk  (04/01/2022)  Physical Activity: Insufficiently Active (04/01/2022)  Social Connections: Moderately Integrated (04/01/2022)  Stress: No Stress Concern Present (04/01/2022)  Tobacco Use: Low Risk  (07/11/2022)    CCM Care Plan  Allergies  Allergen Reactions   Contrast Media [Iodinated Contrast Media] Hives    Medications Reviewed Today     Reviewed by Kris Mouton, CMA (Certified Medical Assistant) on 08/26/22 at 1634  Med List Status: <None>   Medication Order Taking? Sig Documenting Provider Last Dose Status Informant  Ascorbic Acid (VITAMIN C PO) [id number] No Take 2,000 mg by mouth daily. [provider] Taking Active Spouse/Significant Other  AYR SALINE NASAL NA [id number] No Place 1 spray into the nose daily as needed (congestion). [provider] Taking Active Spouse/Significant Other  Cholecalciferol (VITAMIN D3) 50 MCG (2000 UT) TABS [id number] No Take 2,000 Units by mouth at bedtime.  [provider] Taking Active Spouse/Significant Other  ELDERBERRY PO [id number] No Take 15-30 mLs by mouth See admin instructions. Take 15 ml in the morning and 30 ml at night [provider] Taking Active Spouse/Significant Other  ENULOSE 10 GM/15ML SOLN [id number] No Take 40 g by mouth 3 (three) times daily. [provider] Taking Active Spouse/Significant Other  furosemide (LASIX) 20 MG tablet [id number] No Take 2 tablets (40 mg total) by mouth daily. Hold until you see your liver doctor Lorella Nimrod, MD Taking Active   levOCARNitine (CARNITOR) 330 MG tablet [id number] No Take 330 mg by mouth 3 (three) times daily. [provider] Taking Active   Multiple Vitamin (MULTI-VITAMIN) tablet [id number] No Take 1 tablet by mouth daily. [provider] Taking Active Spouse/Significant Other  spironolactone (ALDACTONE) 50 MG  tablet [id number] No Take 2 tablets (100 mg total) by mouth daily. Hold until you see your liver doctor Lorella Nimrod, MD Taking Active   SYNTHROID 75 MCG tablet [id number] No Take 1 tablet (75 mcg total) by mouth daily before breakfast. Katherine Schooner, MD Taking Active Spouse/Significant Other  zinc gluconate 50 MG tablet [id number] No Take 50 mg by mouth 3 (three) times daily. [provider] Taking Active Spouse/Significant Other            Patient Active Problem List   Diagnosis Date Noted   Confusion  08/26/2022   Cystitis 08/26/2022   Recurrent UTI 05/18/2022   Anemia 05/18/2022   Bacteremia 05/04/2022   Hypernatremia 05/04/2022   Sinus bradycardia 02/05/2022   Hepatic encephalopathy ([place]) 02/03/2022   Basal cell carcinoma (BCC) 05/06/2021   Cecal cancer ([place]) 05/06/2021   Hypertension 05/06/2021   COVID-19 vaccination declined 01/30/2021   Decompensated liver cirrhosis with portal HTN and gastric varices 12/04/2020   Essential hypertension 08/12/2020   Murmur, cardiac 03/19/2020   Chronic pain of both knees 01/29/2020   Hypothyroidism 06/30/2019   History of basal cell cancer 06/30/2019   Hx of colon cancer, stage III 11/30/2011   Esophageal varices ([place]) 06/12/2010   Portal hypertension ([place]) 01/23/2008     There is no immunization history on file for this patient.  Conditions to be addressed/monitored:  Hypertension, Hypothyroidism, and Osteopenia, Cirrhosis of liver  There are no care plans that you recently modified to display for this patient.      Medication Assistance: None required.  Patient affirms current coverage meets needs.  Compliance/Adherence/Medication fill history: Care Gaps: Colonoscopy (due 06/25/20) - GI opted to defer  Annual wellness visit in last year? Yes 01/30/2021 Most Recent BP reading: 144/65 on 05/29/2021  Star-Rating Drugs: None  Medication Access: Within the past 30 days, how often has patient missed a dose of  medication? *** Is a pillbox or other method used to improve adherence? {YES/NO:21197} Factors that may affect medication adherence? {CHL DESC; BARRIERS:21522} Are meds synced by current pharmacy? {YES/NO:21197} Are meds delivered by current pharmacy? {YES/NO:21197} Does patient experience delays in picking up medications due to transportation concerns? {YES/NO:21197}  [facility] Reviewed: Is patient disadvantaged to use [facility]?: {YES/NO:21197} Current Rx insurance plan: *** Name and location of Current pharmacy:  [facility] [place] [address] Phone: [phone] Fax: [phone]  [id number]: [phone]: [phone] [facility] services reviewed with patient today?: {YES/NO:21197} Patient requests to transfer care to [facility]?: {YES/NO:21197} Reason patient declined to change pharmacies: {US patient preference:[id number]}   Care Plan and Follow Up Patient Decision:  Patient agrees to Care Plan and Follow-up.  Plan: Telephone follow up appointment with care management team member scheduled for:  6 months  LCharlene Brooke PharmD, BCACP Clinical Pharmacist [id number]  Current Barriers:  Unable to independently monitor therapeutic efficacy  Pharmacist Clinical Goal(s):  Patient will achieve adherence to monitoring guidelines and medication adherence to achieve therapeutic efficacy through collaboration with PharmD and provider.   Interventions: 1:1 collaboration with CLesleigh Noe MD regarding development and update of comprehensive plan of care as evidenced by provider attestation and co-signature Inter-disciplinary care team collaboration (see longitudinal plan of care) Comprehensive medication  review performed; medication list updated in electronic medical record  Hypertension (BP goal <130/80) -Controlled - pt stopped taking losartan 50 mg 02/27/22 due to hypotension (99/38); BP has been generally at goal at home since stopping losartan -Current home readings: checking 2-3 time daily over past week 125/63, 146/88, 138/65, 128/59, 149/73, 117/62, 125/60, 133/65, 124/55 -Current treatment: Furosemide 20 mg daily Spironolactone 50 mg - 2 tab daily -Medications previously tried: losartan (low BP) -Current dietary habits: water only - 8 oz AM, 2 bottles of 16 oz during day,  -Current exercise habits: walking daily w/ husband -Educated on BP goals  and benefits of medications for prevention of heart attack, stroke and kidney damage;Daily salt intake goal < 2300 mg; Importance of home blood pressure monitoring; Symptoms of hypotension and importance of maintaining adequate hydration; -Counseled to monitor BP at home daily, -Advised to remain off losartan and continue monitoring BP; contact PCP if BP consistently > 140/90  Cirrhosis of liver (Goal: prevent complications) -Controlled - pt reports improvement in encephalopathy symptoms since increasing protein in her diet up to ~90 g/day; Pt has protein smoothie (33 g) every morning; she also has protein bars (Clif) -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 MELD 11. CPT B -hepatic encephalopathy s/p covid-19 infection 07/2020, hospitalized for same Jan 2022 -Hx esophageal varices, s/p TIPS procedure -Current treatment  Lactulose 10 gm/15 mL - 45 ml TID goal 3-4 BM daily - Appropriate, Effective, Safe, Accessible Zinc 50 mg TID L-carnitine 330 mg TID - Appropriate, Effective, Safe, Accessible L-ornithine daily PM - not taking Furosemide 20 mg daily Spironolactone 50 mg - 2 tab daily -Medications previously tried: Nurse, mental health (cost prohibitive - may consider [place] in future)  -Recommended to continue current  medication  Hypothyroidism (Goal: maintain TSH in goal range) -Controlled - pt takes 1 hour before other meds -Current treatment  Synthroid 75 mcg daily AM - Appropriate, Effective, Safe, Accessible -Recommended to continue current medication  Osteopenia (Goal prevent fractures) -Controlled - pt walks daily -Last DEXA Scan: 07/10/2019 Tor Netters, NP)  T-Score femoral neck: -1.3  T-Score total hip: -1.3  T-Score lumbar spine: +0.1  10-year probability of major osteoporotic fracture: 7.9%  10-year probability of hip fracture: 0.8% -Patient is not a candidate for pharmacologic treatment -Current treatment  Vitamin D 2000 IU daily -Recommend weight-bearing and muscle strengthening exercises for building and maintaining bone density. -Recommend repeat DEXA scan in the next year  Health Maintenance -Vaccine gaps: Prevnar, Flu, Shingrix, Covid, TDAP. Pt declines vaccines.  Patient Goals/Self-Care Activities Patient will:  - take medications as prescribed as evidenced by patient report and record review focus on medication adherence by routine check blood pressure daily, document, and provide at future appointments engage in dietary modifications by consuming 90g of protein daily

## 2022-09-03 NOTE — Telephone Encounter (Signed)
  Encourage patient to contact the pharmacy for refills or they can request refills through [facility]  Did the patient contact the pharmacy:  No  LAST APPOINTMENT DATE: 06/12/2022  NEXT APPOINTMENT DATE: 10/13/2022  MEDICATION: SYNTHROID 75 MCG table  Is the patient out of medication? 4 days left  PHARMACY: [facility], [place]  Let patient know to contact pharmacy at the end of the day to make sure medication is ready.  Please notify patient to allow 48-72 hours to process

## 2022-09-04 MED ORDER — SYNTHROID 75 MCG PO TABS: 75.0000 ug | ORAL_TABLET | Freq: Every day | ORAL | 0 refills | Status: DC

## 2022-09-04 NOTE — Addendum Note (Signed)
Addended by: Loreen Freud on: 09/04/2022 10:03 AM   Modules accepted: Orders

## 2022-09-08 DIAGNOSIS — K766 Portal hypertension: Secondary | ICD-10-CM | POA: Diagnosis not present

## 2022-09-08 DIAGNOSIS — K7469 Other cirrhosis of liver: Secondary | ICD-10-CM | POA: Diagnosis not present

## 2022-09-08 DIAGNOSIS — I8510 Secondary esophageal varices without bleeding: Secondary | ICD-10-CM | POA: Diagnosis not present

## 2022-09-08 DIAGNOSIS — K7682 Hepatic encephalopathy: Secondary | ICD-10-CM | POA: Diagnosis not present

## 2022-09-21 ENCOUNTER — Telehealth: Payer: Medicare Other

## 2022-09-21 NOTE — Progress Notes (Deleted)
Chronic Care Management Pharmacy Note  09/21/2022 Name:  [name] MRN:  [id number] DOB:  15-Nov-1951  Summary: CCM F/U visit -Pt stopped taking losartan almost 2 weeks ago due to hypotension and BP has been generally < 140/90 at home since. GI recently stopped spironolactone and Lasix due to declining kidney function as well. -Pt reports improvement in encephalopathy symptoms since increasing protein in diet  Recommendations/Changes made from today's visit: -Discontinued losartan from med list. Advised continued daily monitoring of BP. Advised to call if SBP consistently > 140. -Advised to follow up with GI as scheduled  Plan: -B[place]um will call patient in 1 month for BP log -Pharmacist follow up televisit scheduled for 6 months -PCP appt 10/13/22 (TOC - [name] Dugal)    Subjective: [name] is an 71 y.o. year old female who is a primary patient of Waunita Schooner, MD.  The CCM team was consulted for assistance with disease management and care coordination needs.    Engaged with patient by telephone for follow up visit in response to provider referral for pharmacy case management and/or care coordination services.   Consent to Services:  The patient was given information about Chronic Care Management services, agreed to services, and gave verbal consent prior to initiation of services.  Please see initial visit note for detailed documentation.   Patient Care Team: Waunita Schooner, MD as PCP - General (Family Medicine) Thornton Park, MD as Consulting Physician (Gastroenterology) Roosevelt Locks, [place] as Nurse Practitioner (Nurse Practitioner) Charlton Haws, [facility] as Pharmacist (Pharmacist)  Recent office visits: 08/26/22 NP Romilda Garret OV: memory issue - UTI. Rx Keflex. Has been referred to urology. Changed to Macrobid - Ucx enterococcus  06/12/22 Dr Einar Pheasant OV: annual - no changes  06/01/22 Dr Silvio Pate VV: abdominal fullness - UTI. Rx'd amoxil x 3  days  05/18/22 Dr Einar Pheasant OV: hospital f/u - stable BP, restart Lasix 40 mg. Can restart spiro if edema persists. Refer to urology for recurrent UTI vs colonization. Repeat CBC in 1 week before referring to hematology for neutropenia. F/U CBC was similar so referred to hematology.  02/09/22 Dr Einar Pheasant OV: hospital f/u - kidney function down, likely dehydration. No med changes.  01/30/2021 - Waunita Schooner, MD - Patient presented for Annual Wellness Visit. Stop due to medication cost: XIFAXAN 550 MG TABS tablet.   Recent consult visits: 09/08/22 NP Drazek (Liver care): Poor tx candidate. ordered US liver. Consider urology referral for recurrent UTI.  07/10/22 Dr Burr Medico (Hematology): pancytopenia - likely 2/2 liver cirrhosis. No tx required. Repeat labwork next week.   05/20/22 NP Dawn Drazek OV (Liver/Transplant): continue lactulose with goal of 3-4 BM per day. Ordered Xifaxan w/ tier exception. Consider adding BCAA and LOLA supplements.  03/31/22 NP [place] phone note - ordered lasix 20 mg and spironolactone 50 mg. Labs in 1 week. 03/06/22 repeat BMP - kidney fxn improved. Stay off diuretics. 02/20/22 Dr Tarri Glenn (GI): hospital f/u (cirrhosis, encephalopathy): Continue Lactulose 45 ml TID. Provided PAP for Xifaxan. Cr/BUN increased, stop Lasix and Spironolactone. 12/23/21 Dr Tarri Glenn (GI): colonoscopy recommended 11/10/2021 - Roosevelt Locks, NP - [facility] - Patient presented for other cirrhosis of liver, hepatic encephalopathy and secondary esophageal varices without bleeding and weight loss. Procedure: US Abdomen Limited. Pt is poor candidate for transplant d/t hx of colon cancer and age. Rec'd labs, MELD evaluation and colonoscopy.   Hospital visits: 04/30/22 - 05/05/22 Admission ([facility]): Hepatic encephalopathy and UTI. Ammonia 148 after n/v  episode. Received lactulose enema. Developed acute urinary retention requriing foley. Lasix/spiro held during admission and at discharge. Borderline soft  BP. Discharged on Bactrim.   02/02/22 - 02/05/22 Admission ([facility]): Hepatic encephalopathy. Started Lasix, spiro, lactulose and reduced losartan to 50 mg.   Objective:  Lab Results  Component Value Date   CREATININE 0.95 06/01/2022   BUN 19 06/01/2022   GFR 60.49 06/01/2022   GFRNONAA >60 05/05/2022   GFRAA >60 08/17/2020   NA 142 06/01/2022   K 3.6 06/01/2022   CALCIUM 9.5 06/01/2022   CO2 34 (H) 06/01/2022   GLUCOSE 82 06/01/2022    Lab Results  Component Value Date/Time   HGBA1C 5.1 08/14/2020 07:45 AM   GFR 60.49 06/01/2022 02:00 PM   GFR 77.85 05/18/2022 09:06 AM    Last diabetic Eye exam: No results found for: "HMDIABEYEEXA"  Last diabetic Foot exam: No results found for: "HMDIABFOOTEX"   Lab Results  Component Value Date   CHOL 159 09/14/2018   HDL 73.60 09/14/2018   LDLCALC 77 09/14/2018   TRIG 83 08/12/2020   CHOLHDL 2 09/14/2018       Latest Ref Rng & Units 05/18/2022    9:06 AM 05/03/2022    4:32 AM 05/02/2022    4:51 AM  Hepatic Function  Total Protein 6.0 - 8.3 g/dL 5.6  6.4  6.7   Albumin 3.5 - 5.2 g/dL 2.9  2.8  2.9   AST 0 - 37 U/L 83  72  90   ALT 0 - 35 U/L 60  59  72   Alk Phosphatase 39 - 117 U/L 202  145  159   Total Bilirubin 0.2 - 1.2 mg/dL 0.9  1.6  2.3     Lab Results  Component Value Date/Time   TSH 1.62 05/18/2022 09:06 AM   TSH 0.99 01/15/2021 03:31 PM   FREET4 0.88 01/15/2021 03:31 PM   FREET4 0.79 08/01/2020 04:36 PM       Latest Ref Rng & Units 06/01/2022    2:00 PM 05/18/2022    9:06 AM 05/04/2022    3:58 AM  CBC  WBC 4.0 - 10.5 K/uL 2.4 Repeated and verified X2.  2.2 Repeated and verified X2.  3.6   Hemoglobin 12.0 - 15.0 g/dL 11.9  11.1  11.7   Hematocrit 36.0 - 46.0 % 36.2  33.1  36.4   Platelets 150.0 - 400.0 K/uL 134.0  145.0  96     Lab Results  Component Value Date/Time   VD25OH 53.64 09/14/2018 04:35 PM    Clinical ASCVD: No  The ASCVD Risk score (Arnett DK, et al., 2019) failed to calculate for the following  reasons:   Cannot find a previous HDL lab   Cannot find a previous total cholesterol lab       04/01/2022    3:09 PM 02/09/2022    2:07 PM 01/30/2021    3:42 PM  Depression screen PHQ 2/9  Decreased Interest 0 0 0  Down, Depressed, Hopeless 0 0 0  PHQ - 2 Score 0 0 0     Social History   Tobacco Use  Smoking Status Never  Smokeless Tobacco Never  Tobacco Comments   Never smoked   BP Readings from Last 3 Encounters:  08/26/22 122/66  07/10/22 130/71  06/12/22 (!) 100/52   Pulse Readings from Last 3 Encounters:  08/26/22 70  07/10/22 (!) 59  06/12/22 (!) 54   Wt Readings from Last 3 Encounters:  08/26/22 161  lb 4 oz (73.1 kg)  07/10/22 161 lb 2 oz (73.1 kg)  06/12/22 152 lb 8 oz (69.2 kg)   BMI Readings from Last 3 Encounters:  08/26/22 28.79 kg/m  07/10/22 28.77 kg/m  06/12/22 27.23 kg/m    Assessment/Interventions: Review of patient past medical history, allergies, medications, health status, including review of consultants reports, laboratory and other test data, was performed as part of comprehensive evaluation and provision of chronic care management services.   SDOH:  (Social Determinants of Health) assessments and interventions performed: Yes SDOH Interventions    Flowsheet Row Clinical Support from 04/01/2022 in [place] at [place] from 01/24/2020 in [place] at [place] Interventions Intervention Not Indicated --  Housing Interventions Intervention Not Indicated --  Transportation Interventions Intervention Not Indicated --  Depression Interventions/Treatment  -- PHQ2-9 Score <4 Follow-up Not Indicated  Financial Strain Interventions Intervention Not Indicated --  Physical Activity Interventions Intervention Not Indicated --  Stress Interventions Intervention Not Indicated --  Social Connections Interventions Intervention Not Indicated --      SDOH Screenings   Food  Insecurity: No Food Insecurity (04/01/2022)  Housing: Low Risk  (04/01/2022)  Transportation Needs: No Transportation Needs (04/01/2022)  Alcohol Screen: Low Risk  (04/01/2022)  Depression (PHQ2-9): Low Risk  (04/01/2022)  Financial Resource Strain: Low Risk  (04/01/2022)  Physical Activity: Insufficiently Active (04/01/2022)  Social Connections: Moderately Integrated (04/01/2022)  Stress: No Stress Concern Present (04/01/2022)  Tobacco Use: Low Risk  (07/11/2022)    CCM Care Plan  Allergies  Allergen Reactions   Contrast Media [Iodinated Contrast Media] Hives    Medications Reviewed Today     Reviewed by Kris Mouton, CMA (Certified Medical Assistant) on 08/26/22 at 1634  Med List Status: <None>   Medication Order Taking? Sig Documenting Provider Last Dose Status Informant  Ascorbic Acid (VITAMIN C PO) [id number] No Take 2,000 mg by mouth daily. [provider] Taking Active Spouse/Significant Other  AYR SALINE NASAL NA [id number] No Place 1 spray into the nose daily as needed (congestion). [provider] Taking Active Spouse/Significant Other  Cholecalciferol (VITAMIN D3) 50 MCG (2000 UT) TABS [id number] No Take 2,000 Units by mouth at bedtime.  [provider] Taking Active Spouse/Significant Other  ELDERBERRY PO [id number] No Take 15-30 mLs by mouth See admin instructions. Take 15 ml in the morning and 30 ml at night [provider] Taking Active Spouse/Significant Other  ENULOSE 10 GM/15ML SOLN [id number] No Take 40 g by mouth 3 (three) times daily. [provider] Taking Active Spouse/Significant Other  furosemide (LASIX) 20 MG tablet [id number] No Take 2 tablets (40 mg total) by mouth daily. Hold until you see your liver doctor Lorella Nimrod, MD Taking Active   levOCARNitine (CARNITOR) 330 MG tablet [id number] No Take 330 mg by mouth 3 (three) times daily. [provider] Taking Active   Multiple Vitamin (MULTI-VITAMIN) tablet  [id number] No Take 1 tablet by mouth daily. [provider] Taking Active Spouse/Significant Other  spironolactone (ALDACTONE) 50 MG tablet [id number] No Take 2 tablets (100 mg total) by mouth daily. Hold until you see your liver doctor Lorella Nimrod, MD Taking Active   SYNTHROID 75 MCG tablet [id number] No Take 1 tablet (75 mcg total) by mouth daily before breakfast. Waunita Schooner, MD Taking Active Spouse/Significant Other  zinc gluconate 50 MG tablet [id number] No Take 50 mg by mouth 3 (three) times daily. [provider] Taking Active  Spouse/Significant Other            Patient Active Problem List   Diagnosis Date Noted   Confusion 08/26/2022   Cystitis 08/26/2022   Recurrent UTI 05/18/2022   Anemia 05/18/2022   Bacteremia 05/04/2022   Hypernatremia 05/04/2022   Sinus bradycardia 02/05/2022   Hepatic encephalopathy ([place]) 02/03/2022   Basal cell carcinoma (BCC) 05/06/2021   Cecal cancer ([place]) 05/06/2021   Hypertension 05/06/2021   COVID-19 vaccination declined 01/30/2021   Decompensated liver cirrhosis with portal HTN and gastric varices 12/04/2020   Essential hypertension 08/12/2020   Murmur, cardiac 03/19/2020   Chronic pain of both knees 01/29/2020   Hypothyroidism 06/30/2019   History of basal cell cancer 06/30/2019   Hx of colon cancer, stage III 11/30/2011   Esophageal varices ([place]) 06/12/2010   Portal hypertension ([place]) 01/23/2008     There is no immunization history on file for this patient.  Conditions to be addressed/monitored:  Hypertension, Hypothyroidism, and Osteopenia, Cirrhosis of liver  There are no care plans that you recently modified to display for this patient.      Medication Assistance: None required.  Patient affirms current coverage meets needs.  Compliance/Adherence/Medication fill history: Care Gaps: Colonoscopy (due 06/25/20) - GI opted to defer  Annual wellness visit in last year? Yes 01/30/2021 Most Recent BP reading:  144/65 on 05/29/2021  Star-Rating Drugs: None  Medication Access: Within the past 30 days, how often has patient missed a dose of medication? *** Is a pillbox or other method used to improve adherence? {YES/NO:21197} Factors that may affect medication adherence? {CHL DESC; BARRIERS:21522} Are meds synced by current pharmacy? {YES/NO:21197} Are meds delivered by current pharmacy? {YES/NO:21197} Does patient experience delays in picking up medications due to transportation concerns? {YES/NO:21197}  [facility] Reviewed: Is patient disadvantaged to use [facility]?: {YES/NO:21197} Current Rx insurance plan: *** Name and location of Current pharmacy:  [facility] [address] [place] [address] Phone: [phone] Fax: [phone]  [id number]: [phone]: [phone] [facility] services reviewed with patient today?: {YES/NO:21197} Patient requests to transfer care to [facility]?: {YES/NO:21197} Reason patient declined to change pharmacies: {US patient preference:[id number]}   Care Plan and Follow Up Patient Decision:  Patient agrees to Care Plan and Follow-up.  Plan: Telephone follow up appointment with care management team member scheduled for:  6 months  LCharlene Brooke PharmD, BCACP Clinical Pharmacist [phone]  Current Barriers:  Unable to independently monitor therapeutic efficacy  Pharmacist Clinical Goal(s):  Patient will achieve adherence to monitoring guidelines and medication adherence to achieve therapeutic efficacy through collaboration with PharmD and provider.   Interventions: 1:1 collaboration with CLesleigh Noe MD regarding development and update of comprehensive plan of care as evidenced by  provider attestation and co-signature Inter-disciplinary care team collaboration (see longitudinal plan of care) Comprehensive medication review performed; medication list updated in electronic medical record  Hypertension (BP goal <130/80) -Controlled - pt stopped taking losartan 50 mg 02/27/22 due to hypotension (99/38); BP has been generally at goal at home since stopping losartan -Current home readings: checking 2-3 time daily over past week 125/63, 146/88, 138/65, 128/59, 149/73, 117/62, 125/60, 133/65, 124/55 -Current treatment: Furosemide 20 mg daily Spironolactone 50 mg - 2 tab daily -Medications previously tried: losartan (low BP) -Current dietary habits:  water only - 8 oz AM, 2 bottles of 16 oz during day,  -Current exercise habits: walking daily w/ husband -Educated on BP goals and benefits of medications for prevention of heart attack, stroke and kidney damage;Daily salt intake goal < 2300 mg; Importance of home blood pressure monitoring; Symptoms of hypotension and importance of maintaining adequate hydration; -Counseled to monitor BP at home daily, -Advised to remain off losartan and continue monitoring BP; contact PCP if BP consistently > 140/90  Cirrhosis of liver (Goal: prevent complications) -Controlled - pt reports improvement in encephalopathy symptoms since increasing protein in her diet up to ~90 g/day; Pt has protein smoothie (33 g) every morning; she also has protein bars (Clif) -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 MELD 11. CPT B -hepatic encephalopathy s/p covid-19 infection 07/2020, hospitalized for same Jan 2022 -Hx esophageal varices, s/p TIPS procedure -Current treatment  Lactulose 10 gm/15 mL - 45 ml TID goal 3-4 BM daily - Appropriate, Effective, Safe, Accessible Zinc 50 mg TID L-carnitine 330 mg TID - Appropriate, Effective, Safe, Accessible L-ornithine daily PM - not taking Furosemide 20 mg daily Spironolactone 50 mg - 2 tab  daily -Medications previously tried: Nurse, mental health (cost prohibitive - may consider [place] in future)  -Recommended to continue current medication  Hypothyroidism (Goal: maintain TSH in goal range) -Controlled - pt takes 1 hour before other meds -Current treatment  Synthroid 75 mcg daily AM - Appropriate, Effective, Safe, Accessible -Recommended to continue current medication  Osteopenia (Goal prevent fractures) -Controlled - pt walks daily -Last DEXA Scan: 07/10/2019 Tor Netters, NP)  T-Score femoral neck: -1.3  T-Score total hip: -1.3  T-Score lumbar spine: +0.1  10-year probability of major osteoporotic fracture: 7.9%  10-year probability of hip fracture: 0.8% -Patient is not a candidate for pharmacologic treatment -Current treatment  Vitamin D 2000 IU daily -Recommend weight-bearing and muscle strengthening exercises for building and maintaining bone density. -Recommend repeat DEXA scan in the next year  Health Maintenance -Vaccine gaps: Prevnar, Flu, Shingrix, Covid, TDAP. Pt declines vaccines.  Patient Goals/Self-Care Activities Patient will:  - take medications as prescribed as evidenced by patient report and record review focus on medication adherence by routine check blood pressure daily, document, and provide at future appointments engage in dietary modifications by consuming 90g of protein daily

## 2022-09-25 ENCOUNTER — Telehealth: Payer: Self-pay

## 2022-09-25 NOTE — Progress Notes (Signed)
    Chronic Care Management Pharmacy Assistant   Name: [name]  MRN: [id number] DOB: [birth year]  Reason for Encounter: CCM (Appointment Reminder)  Medications: Outpatient Encounter Medications as of 09/25/2022  Medication Sig   Ascorbic Acid (VITAMIN C PO) Take 2,000 mg by mouth daily.   AYR SALINE NASAL NA Place 1 spray into the nose daily as needed (congestion).   Cholecalciferol (VITAMIN D3) 50 MCG (2000 UT) TABS Take 2,000 Units by mouth at bedtime.    ELDERBERRY PO Take 15-30 mLs by mouth See admin instructions. Take 15 ml in the morning and 30 ml at night   ENULOSE 10 GM/15ML SOLN Take 40 g by mouth 3 (three) times daily.   furosemide (LASIX) 20 MG tablet Take 2 tablets (40 mg total) by mouth daily. Hold until you see your liver doctor   levOCARNitine (CARNITOR) 330 MG tablet Take 330 mg by mouth 3 (three) times daily.   Multiple Vitamin (MULTI-VITAMIN) tablet Take 1 tablet by mouth daily.   spironolactone (ALDACTONE) 50 MG tablet Take 2 tablets (100 mg total) by mouth daily. Hold until you see your liver doctor   SYNTHROID 75 MCG tablet Take 1 tablet (75 mcg total) by mouth daily before breakfast.   zinc gluconate 50 MG tablet Take 50 mg by mouth 3 (three) times daily.   No facility-administered encounter medications on file as of 09/25/2022.   [name] was contacted to remind of upcoming telephone visit with Charlene Brooke on 09/30/2022 at 3:45. Patient was reminded to have any blood glucose and blood pressure readings available for review at appointment.   Message was left reminding patient of appointment.  CCM referral has been placed prior to visit?  No   Star Rating Drugs: Medication:  Last Fill: Day Supply No Star Rating Drugs Noted  Charlene Brooke, CPP notified  Marijean Niemann, [place] Clinical Pharmacy Assistant [phone]

## 2022-09-30 ENCOUNTER — Ambulatory Visit: Payer: Medicare Other | Admitting: Pharmacist

## 2022-09-30 DIAGNOSIS — I10 Essential (primary) hypertension: Secondary | ICD-10-CM

## 2022-09-30 DIAGNOSIS — K7460 Unspecified cirrhosis of liver: Secondary | ICD-10-CM

## 2022-09-30 DIAGNOSIS — E039 Hypothyroidism, unspecified: Secondary | ICD-10-CM

## 2022-09-30 NOTE — Progress Notes (Signed)
Chronic Care Management Pharmacy Note  10/08/2022 Name:  [name] MRN:  [id number] DOB:  [birth year]  Summary: CCM F/U visit -Pt is taking meds as prescribed except taking 25 mg of spironolactone instead of 100 mg due to previous issues with low BP; ideally furosemide:spiro ratio is 40:100 mg for cirrhotic ascites, but she has no history of ascites per specialist notes  Recommendations/Changes made from today's visit: -Advised it is reasonable to continue furosemide and spironolactone at current doses  Plan: -[facility] will call patient in 1 month for BP log -Pharmacist follow up televisit scheduled for 6 months -PCP appt 12/24/22 (TOC - [name] Dugal)    Subjective: [name] is an 71 y.o. year old female who is a primary patient of Waunita Schooner, MD.  The CCM team was consulted for assistance with disease management and care coordination needs.    Engaged with patient by telephone for follow up visit in response to provider referral for pharmacy case management and/or care coordination services.   Consent to Services:  The patient was given information about Chronic Care Management services, agreed to services, and gave verbal consent prior to initiation of services.  Please see initial visit note for detailed documentation.   Patient Care Team: Waunita Schooner, MD as PCP - General (Family Medicine) Thornton Park, MD as Consulting Physician (Gastroenterology) Roosevelt Locks, CRNP as Nurse Practitioner (Nurse Practitioner) Charlton Haws, [facility] as Pharmacist (Pharmacist)  Patient lives at home with husband. She works at [employer].   Recent office visits: 08/26/22 NP Romilda Garret OV: memory issue - UTI. Rx Keflex. Has been referred to urology. Changed to Macrobid - Ucx enterococcus  06/12/22 Dr Einar Pheasant OV: annual - no changes  06/01/22 Dr Silvio Pate VV: abdominal fullness - UTI. Rx'd amoxil x 3 days  05/18/22 Dr Einar Pheasant OV: hospital f/u - stable BP, restart Lasix  40 mg. Can restart spiro if edema persists. Refer to urology for recurrent UTI vs colonization. Repeat CBC in 1 week before referring to hematology for neutropenia. F/U CBC was similar so referred to hematology.  02/09/22 Dr Einar Pheasant OV: hospital f/u - kidney function down, likely dehydration. No med changes.  01/30/2021 - Waunita Schooner, MD - Patient presented for Annual Wellness Visit. Stop due to medication cost: XIFAXAN 550 MG TABS tablet.   Recent consult visits: 09/08/22 NP Drazek (Liver care): Poor tx candidate. ordered US liver. Consider urology referral for recurrent UTI.  07/10/22 Dr Burr Medico (Hematology): pancytopenia - likely 2/2 liver cirrhosis. No tx required. Repeat labwork next week.   05/20/22 NP Dawn Drazek OV (Liver/Transplant): continue lactulose with goal of 3-4 BM per day. Ordered Xifaxan w/ tier exception. Consider adding BCAA and LOLA supplements.  03/31/22 NP [place] phone note - ordered lasix 20 mg and spironolactone 50 mg. Labs in 1 week. 03/06/22 repeat BMP - kidney fxn improved. Stay off diuretics. 02/20/22 Dr Tarri Glenn (GI): hospital f/u (cirrhosis, encephalopathy): Continue Lactulose 45 ml TID. Provided PAP for Xifaxan. Cr/BUN increased, stop Lasix and Spironolactone. 12/23/21 Dr Tarri Glenn (GI): colonoscopy recommended 11/10/2021 - Roosevelt Locks, NP - [facility] - Patient presented for other cirrhosis of liver, hepatic encephalopathy and secondary esophageal varices without bleeding and weight loss. Procedure: US Abdomen Limited. Pt is poor candidate for transplant d/t hx of colon cancer and age. Rec'd labs, MELD evaluation and colonoscopy.   Hospital visits: 04/30/22 - 05/05/22 Admission ([facility]): Hepatic encephalopathy and UTI. Ammonia 148 after n/v episode. Received lactulose enema. Developed  acute urinary retention requriing foley. Lasix/spiro held during admission and at discharge. Borderline soft BP. Discharged on Bactrim.   02/02/22 - 02/05/22 Admission ([facility]):  Hepatic encephalopathy. Started Lasix, spiro, lactulose and reduced losartan to 50 mg.   Objective:  Lab Results  Component Value Date   CREATININE 0.95 06/01/2022   BUN 19 06/01/2022   GFR 60.49 06/01/2022   GFRNONAA >60 05/05/2022   GFRAA >60 08/17/2020   NA 142 06/01/2022   K 3.6 06/01/2022   CALCIUM 9.5 06/01/2022   CO2 34 (H) 06/01/2022   GLUCOSE 82 06/01/2022    Lab Results  Component Value Date/Time   HGBA1C 5.1 08/14/2020 07:45 AM   GFR 60.49 06/01/2022 02:00 PM   GFR 77.85 05/18/2022 09:06 AM    Last diabetic Eye exam: No results found for: "HMDIABEYEEXA"  Last diabetic Foot exam: No results found for: "HMDIABFOOTEX"   Lab Results  Component Value Date   CHOL 159 09/14/2018   HDL 73.60 09/14/2018   LDLCALC 77 09/14/2018   TRIG 83 08/12/2020   CHOLHDL 2 09/14/2018       Latest Ref Rng & Units 05/18/2022    9:06 AM 05/03/2022    4:32 AM 05/02/2022    4:51 AM  Hepatic Function  Total Protein 6.0 - 8.3 g/dL 5.6  6.4  6.7   Albumin 3.5 - 5.2 g/dL 2.9  2.8  2.9   AST 0 - 37 U/L 83  72  90   ALT 0 - 35 U/L 60  59  72   Alk Phosphatase 39 - 117 U/L 202  145  159   Total Bilirubin 0.2 - 1.2 mg/dL 0.9  1.6  2.3     Lab Results  Component Value Date/Time   TSH 1.62 05/18/2022 09:06 AM   TSH 0.99 01/15/2021 03:31 PM   FREET4 0.88 01/15/2021 03:31 PM   FREET4 0.79 08/01/2020 04:36 PM       Latest Ref Rng & Units 06/01/2022    2:00 PM 05/18/2022    9:06 AM 05/04/2022    3:58 AM  CBC  WBC 4.0 - 10.5 K/uL 2.4 Repeated and verified X2.  2.2 Repeated and verified X2.  3.6   Hemoglobin 12.0 - 15.0 g/dL 11.9  11.1  11.7   Hematocrit 36.0 - 46.0 % 36.2  33.1  36.4   Platelets 150.0 - 400.0 K/uL 134.0  145.0  96     Lab Results  Component Value Date/Time   VD25OH 53.64 09/14/2018 04:35 PM    Clinical ASCVD: No  The ASCVD Risk score (Arnett DK, et al., 2019) failed to calculate for the following reasons:   Cannot find a previous HDL lab   Cannot find a  previous total cholesterol lab       04/01/2022    3:09 PM 02/09/2022    2:07 PM 01/30/2021    3:42 PM  Depression screen PHQ 2/9  Decreased Interest 0 0 0  Down, Depressed, Hopeless 0 0 0  PHQ - 2 Score 0 0 0     Social History   Tobacco Use  Smoking Status Never  Smokeless Tobacco Never  Tobacco Comments   Never smoked   BP Readings from Last 3 Encounters:  08/26/22 122/66  07/10/22 130/71  06/12/22 (!) 100/52   Pulse Readings from Last 3 Encounters:  08/26/22 70  07/10/22 (!) 59  06/12/22 (!) 54   Wt Readings from Last 3 Encounters:  08/26/22 161 lb 4 oz (73.1 kg)  07/10/22 161 lb 2 oz (73.1 kg)  06/12/22 152 lb 8 oz (69.2 kg)   BMI Readings from Last 3 Encounters:  08/26/22 28.79 kg/m  07/10/22 28.77 kg/m  06/12/22 27.23 kg/m    Assessment/Interventions: Review of patient past medical history, allergies, medications, health status, including review of consultants reports, laboratory and other test data, was performed as part of comprehensive evaluation and provision of chronic care management services.   SDOH:  (Social Determinants of Health) assessments and interventions performed: Yes SDOH Interventions    Flowsheet Row Clinical Support from 04/01/2022 in [place] at [place] from 01/24/2020 in [place] at [place] Interventions Intervention Not Indicated --  Housing Interventions Intervention Not Indicated --  Transportation Interventions Intervention Not Indicated --  Depression Interventions/Treatment  -- PHQ2-9 Score <4 Follow-up Not Indicated  Financial Strain Interventions Intervention Not Indicated --  Physical Activity Interventions Intervention Not Indicated --  Stress Interventions Intervention Not Indicated --  Social Connections Interventions Intervention Not Indicated --      SDOH Screenings   Food Insecurity: No Food Insecurity (04/01/2022)  Housing: Low  Risk  (04/01/2022)  Transportation Needs: No Transportation Needs (04/01/2022)  Alcohol Screen: Low Risk  (04/01/2022)  Depression (PHQ2-9): Low Risk  (04/01/2022)  Financial Resource Strain: Low Risk  (04/01/2022)  Physical Activity: Insufficiently Active (04/01/2022)  Social Connections: Moderately Integrated (04/01/2022)  Stress: No Stress Concern Present (04/01/2022)  Tobacco Use: Low Risk  (07/11/2022)    CCM Care Plan  Allergies  Allergen Reactions   Contrast Media [Iodinated Contrast Media] Hives    Medications Reviewed Today     Reviewed by Charlton Haws, [facility] (Pharmacist) on 09/30/22 at 1615  Med List Status: <None>   Medication Order Taking? Sig Documenting Provider Last Dose Status Informant  Ascorbic Acid (VITAMIN C PO) [id number] Yes Take 2,000 mg by mouth daily. [provider] Taking Active Spouse/Significant Other  AYR SALINE NASAL NA [id number] Yes Place 1 spray into the nose daily as needed (congestion). [provider] Taking Active Spouse/Significant Other  Cholecalciferol (VITAMIN D3) 50 MCG (2000 UT) TABS [id number] Yes Take 2,000 Units by mouth at bedtime.  [provider] Taking Active Spouse/Significant Other  ELDERBERRY PO [id number] Yes Take 15-30 mLs by mouth See admin instructions. Take 15 ml in the morning and 30 ml at night [provider] Taking Active Spouse/Significant Other  ENULOSE 10 GM/15ML SOLN [id number] Yes Take 40 g by mouth 3 (three) times daily. [provider] Taking Active Spouse/Significant Other  furosemide (LASIX) 20 MG tablet [id number] Yes Take 2 tablets (40 mg total) by mouth daily. Hold until you see your liver doctor Lorella Nimrod, MD Taking Active   levOCARNitine (CARNITOR) 330 MG tablet [id number] Yes Take 330 mg by mouth 3 (three) times daily. [provider] Taking Active   Multiple Vitamin (MULTI-VITAMIN) tablet [id number] Yes Take 1 tablet by mouth daily. [provider]  Taking Active Spouse/Significant Other  spironolactone (ALDACTONE) 50 MG tablet [id number] Yes Take 2 tablets (100 mg total) by mouth daily. Hold until you see your liver doctor  Patient taking differently: Take 25 mg by mouth daily.   Lorella Nimrod, MD Taking Active            Med Note Malena Catholic Sep 30, 2022  4:15 PM) Could not tolerate higher doses due to low BP  SYNTHROID 75 MCG tablet [id number] Yes Take 1 tablet (  75 mcg total) by mouth daily before breakfast. Dutch Quint B, FNP Taking Active   zinc gluconate 50 MG tablet [id number] Yes Take 50 mg by mouth 3 (three) times daily. [provider] Taking Active Spouse/Significant Other            Patient Active Problem List   Diagnosis Date Noted   Confusion 08/26/2022   Cystitis 08/26/2022   Recurrent UTI 05/18/2022   Anemia 05/18/2022   Bacteremia 05/04/2022   Hypernatremia 05/04/2022   Sinus bradycardia 02/05/2022   Hepatic encephalopathy ([place]) 02/03/2022   Basal cell carcinoma (BCC) 05/06/2021   Cecal cancer ([place]) 05/06/2021   Hypertension 05/06/2021   COVID-19 vaccination declined 01/30/2021   Decompensated liver cirrhosis with portal HTN and gastric varices 12/04/2020   Essential hypertension 08/12/2020   Murmur, cardiac 03/19/2020   Chronic pain of both knees 01/29/2020   Hypothyroidism 06/30/2019   History of basal cell cancer 06/30/2019   Hx of colon cancer, stage III 11/30/2011   Esophageal varices ([place]) 06/12/2010   Portal hypertension ([place]) 01/23/2008     There is no immunization history on file for this patient.  Conditions to be addressed/monitored:  Hypertension, Hypothyroidism, and Osteopenia, Cirrhosis of liver  Care Plan : [place]  Updates made by Charlton Haws, [place] since 10/08/2022 12:00 AM     Problem: Hypertension, Hypothyroidism, and Osteopenia, Cirrhosis of liver   Priority: High     Long-Range Goal: Disease mgmt   Start Date: 11/25/2021   Expected End Date: 11/25/2022  This Visit's Progress: On track  Recent Progress: On track  Priority: High  Note:   Current Barriers:  None identified  Pharmacist Clinical Goal(s):  Patient will contact provider office for questions/concerns as evidenced notation of same in electronic health record through collaboration with PharmD and provider.   Interventions: 1:1 collaboration with Lesleigh Noe, MD regarding development and update of comprehensive plan of care as evidenced by provider attestation and co-signature Inter-disciplinary care team collaboration (see longitudinal plan of care) Comprehensive medication review performed; medication list updated in electronic medical record  Hypertension (BP goal <130/80) -Controlled - h/o hypotension with losartan; currently doing well on current meds -Current home readings: 120s/60s -Current treatment: Furosemide 20 mg - 2 tab daily - Appropriate, Effective, Safe, Accessible Spironolactone 50 mg - 1/2 tab daily - Appropriate, Effective, Safe, Accessible -Medications previously tried: losartan (low BP) -Current dietary habits: water only - 8 oz AM, 2 bottles of 16 oz during day,  -Current exercise habits: walking daily w/ husband -Educated on BP goals and benefits of medications for prevention of heart attack, stroke and kidney damage;Daily salt intake goal < 2300 mg; Importance of home blood pressure monitoring; Symptoms of hypotension and importance of maintaining adequate hydration; -Counseled to monitor BP at home daily  Cirrhosis of liver (Goal: prevent complications) -Controlled - pt reports improvement in encephalopathy symptoms since increasing protein in her diet up to ~90 g/day; Pt has protein smoothie (33 g) every morning; she also has protein bars (Clif) -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 MELD 11. CPT B -multiple hospitalizations for hepatic encephalopathy (01/2022, 04/2022) -Hx esophageal varices, s/p  TIPS procedure -Current treatment  Lactulose 10 gm/15 mL - 45 ml TID goal 3-4 BM daily - Appropriate, Effective, Safe, Accessible Zinc 50 mg TID - Appropriate, Effective, Safe, Accessible L-carnitine 330 mg TID - Appropriate, Effective, Safe, Accessible L-ornithine daily PM - not taking Furosemide 20 mg daily -Appropriate, Effective, Safe, Accessible Spironolactone 50 mg -  2 tab daily -Appropriate, Effective, Safe, Accessible -Medications previously tried: Nurse, mental health (cost prohibitive - may consider [place] in future)  -Reviewed diuretic doses: ideally furosemide:spiro ratio is 40:100 mg for cirrhotic ascites, but she has no history of ascites per specialist notes so it is reasonable to continue with current doses -Recommended to continue current medication  Hypothyroidism (Goal: maintain TSH in goal range) -Controlled - pt takes 1 hour before other meds -Current treatment  Synthroid 75 mcg daily AM - Appropriate, Effective, Safe, Accessible -Recommended to continue current medication  Osteopenia (Goal prevent fractures) -Controlled - pt walks daily -Last DEXA Scan: 07/10/2019 Tor Netters, NP)  T-Score femoral neck: -1.3  T-Score total hip: -1.3  T-Score lumbar spine: +0.1  10-year probability of major osteoporotic fracture: 7.9%  10-year probability of hip fracture: 0.8% -Patient is not a candidate for pharmacologic treatment -Current treatment  Vitamin D 2000 IU daily -Recommend weight-bearing and muscle strengthening exercises for building and maintaining bone density. -Recommend repeat DEXA scan in the next year  Health Maintenance -Vaccine gaps: Prevnar, Flu, Shingrix, Covid, TDAP. Pt declines vaccines. -Recurrent UTIs: pt has seen urology; pt reports she is not colonized  Patient Goals/Self-Care Activities Patient will:  - take medications as prescribed as evidenced by patient report and record review focus on medication adherence by routine check blood  pressure daily, document, and provide at future appointments engage in dietary modifications by consuming 90g of protein daily     Medication Assistance: None required.  Patient affirms current coverage meets needs.  Compliance/Adherence/Medication fill history: Care Gaps: Colonoscopy (due 06/25/20) - GI opted to defer   Star-Rating Drugs: None  Medication Access: Within the past 30 days, how often has patient missed a dose of medication? 0 Is a pillbox or other method used to improve adherence? Yes  Factors that may affect medication adherence? no barriers identified Are meds synced by current pharmacy? No  Are meds delivered by current pharmacy? No  Does patient experience delays in picking up medications due to transportation concerns? No   [facility] Services Reviewed: Is patient disadvantaged to use [facility]?: No  Current Rx insurance plan: [facility] Name and location of Current pharmacy:  [facility], [place] [address] Phone: [phone] Fax: [phone]  [id number]Phone: [phone]: [phone] [facility] services reviewed with patient today?: No  Patient requests to transfer care to [facility]?: No  Reason patient declined to change pharmacies: Loyalty to other pharmacy/Patient preference   Care Plan and Follow Up Patient Decision:  Patient agrees to Care Plan and Follow-up.  Plan: Telephone follow up appointment with care management team member scheduled for:  6 months  LCharlene Brooke PharmD, BCACP Clinical Pharmacist [phone]

## 2022-10-08 NOTE — Patient Instructions (Signed)
Visit Information  Phone number for Pharmacist: [phone]   Goals Addressed   None     Care Plan : [place]  Updates made by Charlton Haws, [place] since 10/08/2022 12:00 AM     Problem: Hypertension, Hypothyroidism, and Osteopenia, Cirrhosis of liver   Priority: High     Long-Range Goal: Disease mgmt   Start Date: 11/25/2021  Expected End Date: 11/25/2022  This Visit's Progress: On track  Recent Progress: On track  Priority: High  Note:   Current Barriers:  None identified  Pharmacist Clinical Goal(s):  Patient will contact provider office for questions/concerns as evidenced notation of same in electronic health record through collaboration with PharmD and provider.   Interventions: 1:1 collaboration with Lesleigh Noe, MD regarding development and update of comprehensive plan of care as evidenced by provider attestation and co-signature Inter-disciplinary care team collaboration (see longitudinal plan of care) Comprehensive medication review performed; medication list updated in electronic medical record  Hypertension (BP goal <130/80) -Controlled - h/o hypotension with losartan; currently doing well on current meds -Current home readings: 120s/60s -Current treatment: Furosemide 20 mg - 2 tab daily - Appropriate, Effective, Safe, Accessible Spironolactone 50 mg - 1/2 tab daily - Appropriate, Effective, Safe, Accessible -Medications previously tried: losartan (low BP) -Current dietary habits: water only - 8 oz AM, 2 bottles of 16 oz during day,  -Current exercise habits: walking daily w/ husband -Educated on BP goals and benefits of medications for prevention of heart attack, stroke and kidney damage;Daily salt intake goal < 2300 mg; Importance of home blood pressure monitoring; Symptoms of hypotension and importance of maintaining adequate hydration; -Counseled to monitor BP at home daily  Cirrhosis of liver (Goal: prevent complications) -Controlled  - pt reports improvement in encephalopathy symptoms since increasing protein in her diet up to ~90 g/day; Pt has protein smoothie (33 g) every morning; she also has protein bars (Clif) -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 MELD 11. CPT B -multiple hospitalizations for hepatic encephalopathy (01/2022, 04/2022) -Hx esophageal varices, s/p TIPS procedure -Current treatment  Lactulose 10 gm/15 mL - 45 ml TID goal 3-4 BM daily - Appropriate, Effective, Safe, Accessible Zinc 50 mg TID - Appropriate, Effective, Safe, Accessible L-carnitine 330 mg TID - Appropriate, Effective, Safe, Accessible L-ornithine daily PM - not taking Furosemide 20 mg daily -Appropriate, Effective, Safe, Accessible Spironolactone 50 mg - 1/2 tab daily -Appropriate, Effective, Safe, Accessible -Medications previously tried: Nurse, mental health (cost prohibitive - may consider [place] in future)  -Reviewed diuretic doses: ideally furosemide:spiro ratio is 40:100 mg for cirrhotic ascites, but she has no history of ascites per specialist notes so it is reasonable to continue with current doses -Recommended to continue current medication  Hypothyroidism (Goal: maintain TSH in goal range) -Controlled - pt takes 1 hour before other meds -Current treatment  Synthroid 75 mcg daily AM - Appropriate, Effective, Safe, Accessible -Recommended to continue current medication  Osteopenia (Goal prevent fractures) -Controlled - pt walks daily -Last DEXA Scan: 07/10/2019 Tor Netters, NP)  T-Score femoral neck: -1.3  T-Score total hip: -1.3  T-Score lumbar spine: +0.1  10-year probability of major osteoporotic fracture: 7.9%  10-year probability of hip fracture: 0.8% -Patient is not a candidate for pharmacologic treatment -Current treatment  Vitamin D 2000 IU daily -Recommend weight-bearing and muscle strengthening exercises for building and maintaining bone density. -Recommend repeat DEXA scan in the next  year  Health Maintenance -Vaccine gaps: Prevnar, Flu, Shingrix, Covid, TDAP. Pt declines vaccines. -Recurrent UTIs:  pt has seen urology; pt reports she is not colonized  Patient Goals/Self-Care Activities Patient will:  - take medications as prescribed as evidenced by patient report and record review focus on medication adherence by routine check blood pressure daily, document, and provide at future appointments engage in dietary modifications by consuming 90g of protein daily      Patient verbalizes understanding of instructions and care plan provided today and agrees to view in [place]. Active MyChart status and patient understanding of how to access instructions and care plan via MyChart confirmed with patient.    Telephone follow up appointment with pharmacy team member scheduled for: 6 months  Charlene Brooke, PharmD, [facility] Clinical Pharmacist [facility] [phone]

## 2022-10-13 ENCOUNTER — Encounter: Payer: Medicare Other | Admitting: Family

## 2022-10-23 ENCOUNTER — Other Ambulatory Visit: Payer: Self-pay | Admitting: Nurse Practitioner

## 2022-10-23 DIAGNOSIS — K7469 Other cirrhosis of liver: Secondary | ICD-10-CM

## 2022-10-23 DIAGNOSIS — K7682 Hepatic encephalopathy: Secondary | ICD-10-CM

## 2022-10-23 DIAGNOSIS — K766 Portal hypertension: Secondary | ICD-10-CM

## 2022-10-23 DIAGNOSIS — I8500 Esophageal varices without bleeding: Secondary | ICD-10-CM

## 2022-10-29 ENCOUNTER — Telehealth: Payer: Self-pay

## 2022-10-29 NOTE — Progress Notes (Signed)
Chronic Care Management Pharmacy Assistant   Name: [name]  MRN: [id number] DOB: 12/28/1950  Reason for Encounter: CCM (Hypertension Disease State)  Recent office visits:  None since last CCM contact  Recent consult visits:  None since last CCM contact  Hospital visits:  None since last CCM contact  Medications: Outpatient Encounter Medications as of 10/29/2022  Medication Sig Note   Ascorbic Acid (VITAMIN C PO) Take 2,000 mg by mouth daily.    AYR SALINE NASAL NA Place 1 spray into the nose daily as needed (congestion).    Cholecalciferol (VITAMIN D3) 50 MCG (2000 UT) TABS Take 2,000 Units by mouth at bedtime.     ELDERBERRY PO Take 15-30 mLs by mouth See admin instructions. Take 15 ml in the morning and 30 ml at night    ENULOSE 10 GM/15ML SOLN Take 40 g by mouth 3 (three) times daily.    furosemide (LASIX) 20 MG tablet Take 2 tablets (40 mg total) by mouth daily. Hold until you see your liver doctor    levOCARNitine (CARNITOR) 330 MG tablet Take 330 mg by mouth 3 (three) times daily.    Multiple Vitamin (MULTI-VITAMIN) tablet Take 1 tablet by mouth daily.    spironolactone (ALDACTONE) 50 MG tablet Take 2 tablets (100 mg total) by mouth daily. Hold until you see your liver doctor (Patient taking differently: Take 25 mg by mouth daily.) 09/30/2022: Could not tolerate higher doses due to low BP   SYNTHROID 75 MCG tablet Take 1 tablet (75 mcg total) by mouth daily before breakfast.    zinc gluconate 50 MG tablet Take 50 mg by mouth 3 (three) times daily.    No facility-administered encounter medications on file as of 10/29/2022.    Recent Office Vitals: BP Readings from Last 3 Encounters:  08/26/22 122/66  07/10/22 130/71  06/12/22 (!) 100/52   Pulse Readings from Last 3 Encounters:  08/26/22 70  07/10/22 (!) 59  06/12/22 (!) 54    Wt Readings from Last 3 Encounters:  08/26/22 161 lb 4 oz (73.1 kg)  07/10/22 161 lb 2 oz (73.1 kg)  06/12/22 152 lb 8 oz (69.2 kg)      Kidney Function Lab Results  Component Value Date/Time   CREATININE 0.95 06/01/2022 02:00 PM   CREATININE 0.77 05/18/2022 09:06 AM   CREATININE 0.8 01/08/2015 03:47 PM   CREATININE 0.9 12/12/2013 03:50 PM   GFR 60.49 06/01/2022 02:00 PM   GFRNONAA >60 05/05/2022 09:51 AM   GFRAA >60 08/17/2020 01:35 AM       Latest Ref Rng & Units 06/01/2022    2:00 PM 05/18/2022    9:06 AM 05/05/2022    9:51 AM  BMP  Glucose 70 - 99 mg/dL 82  80  80   BUN 6 - 23 mg/dL '19  17  16   '$ Creatinine 0.40 - 1.20 mg/dL 0.95  0.77  0.75   Sodium 135 - 145 mEq/L 142  142  138   Potassium 3.5 - 5.1 mEq/L 3.6  4.1  3.2   Chloride 96 - 112 mEq/L 104  107  106   CO2 19 - 32 mEq/L 34  28  24   Calcium 8.4 - 10.5 mg/dL 9.5  9.0  8.7    Attempted contact with patient 3 times. Unsuccessful outreach. Will atttempt contact next month.  Current antihypertensive regimen:  Losartan 100 mg daily HS - not taking    Is the patient currently on ACE/ARB medication?  No Does the patient have >5 day gap between last estimated fill dates? N/A  Star Rating Drugs: Medication:                Last Fill:         Day Supply No Star Rating Drugs Noted   Care Gaps: Annual wellness visit in last year? Yes 04/01/2022 Most Recent BP reading: 122/66 on 08/26/22   Summary of recommendations from last [place] visit (Date:09/30/2022)  Summary: CCM F/U visit -Pt is taking meds as prescribed except taking 25 mg of spironolactone instead of 100 mg due to previous issues with low BP; ideally furosemide:spiro ratio is 40:100 mg for cirrhotic ascites, but she has no history of ascites per specialist notes   Recommendations/Changes made from today's visit: -Advised it is reasonable to continue furosemide and spironolactone at current doses   Plan: -Douglas will call patient in 1 month for BP log -Pharmacist follow up televisit scheduled for 6 months -PCP appt 12/24/22 (TOC - Eugenia Pancoast)   Mammogram  appointment on 11/02/2022 US Abdomen appointment on 11/10/2022 PCP appointment on 12/24/2022 Transfer of Care CCM appointment on 03/09/2023  Charlene Brooke, CPP notified  Marijean Niemann, [place] Assistant [phone]

## 2022-10-30 ENCOUNTER — Ambulatory Visit
Admission: EM | Admit: 2022-10-30 | Discharge: 2022-10-30 | Disposition: A | Discharge status: Home / Self Care | Payer: Medicare Other | Attending: Physician Assistant | Admitting: Physician Assistant

## 2022-10-30 ENCOUNTER — Telehealth: Payer: Self-pay | Admitting: Family Medicine

## 2022-10-30 DIAGNOSIS — N3000 Acute cystitis without hematuria: Secondary | ICD-10-CM | POA: Insufficient documentation

## 2022-10-30 LAB — POCT URINALYSIS DIP (MANUAL ENTRY)
Bilirubin, UA: NEGATIVE
Glucose, UA: NEGATIVE mg/dL
Ketones, POC UA: NEGATIVE mg/dL
Nitrite, UA: NEGATIVE
Protein Ur, POC: NEGATIVE mg/dL
Spec Grav, UA: 1.02 (ref 1.010–1.025)
Urobilinogen, UA: 0.2 E.U./dL
pH, UA: 6 (ref 5.0–8.0)

## 2022-10-30 MED ORDER — NITROFURANTOIN MONOHYD MACRO 100 MG PO CAPS: 100.0000 mg | ORAL_CAPSULE | Freq: Two times a day (BID) | ORAL | 0 refills | Status: DC

## 2022-10-30 NOTE — Telephone Encounter (Signed)
Patient is scheduled for a TOC with [name] in February. Patient husband Ozzie Hoyle called in stating that his wife has an UTI. Ozzie Hoyle stated that he know she has an UTI because she starts to act all woozy. Informed patient she will need to be seen in order for anything to be sent in. He would rather something be called in since she can't be seen today here at the office. Please advise. Thank you!

## 2022-10-30 NOTE — Telephone Encounter (Signed)
Called and gave pt information. She said she would go to urgent care.

## 2022-10-30 NOTE — ED Provider Notes (Signed)
[facility]    CSN: [id number] Arrival date & time: 10/30/22  1656      History   Chief Complaint No chief complaint on file.   HPI [name] is a 71 y.o. female.   HPI  Past Medical History:  Diagnosis Date   Acute urinary retention 05/04/2022   Allergy 2006 ?   Contrast dye   Arthritis 2016 ??   Knees and thumb   Cancer ([place])    cecum   Cataract 2021   Surgery scheduled July 2023   Colon cancer [facility]) 2003   Elevated liver function tests    Esophageal varices (HCC)    Heart murmur On file   Hemorrhage of gastrointestinal tract 05/04/2011   Hypertension 2021   Hypothyroidism    Iron deficiency anemia    Liver disease    chemotherapy complication, per pt, shunts placed to bypass liver   Malignant neoplasm of cecum ([place])    Portal hypertension ([place])    Skin cancer 2019   Splenomegaly     Patient Active Problem List   Diagnosis Date Noted   Confusion 08/26/2022   Cystitis 08/26/2022   Recurrent UTI 05/18/2022   Anemia 05/18/2022   Bacteremia 05/04/2022   Hypernatremia 05/04/2022   Sinus bradycardia 02/05/2022   Hepatic encephalopathy ([place]) 02/03/2022   Basal cell carcinoma (BCC) 05/06/2021   Cecal cancer ([place]) 05/06/2021   Hypertension 05/06/2021   COVID-19 vaccination declined 01/30/2021   Decompensated liver cirrhosis with portal HTN and gastric varices 12/04/2020   Essential hypertension 08/12/2020   Murmur, cardiac 03/19/2020   Chronic pain of both knees 01/29/2020   Hypothyroidism 06/30/2019   History of basal cell cancer 06/30/2019   Hx of colon cancer, stage III 11/30/2011   Esophageal varices ([place]) 06/12/2010   Portal hypertension ([place]) 01/23/2008    Past Surgical History:  Procedure Laterality Date   COLON SURGERY  2004   Cancer   COSMETIC SURGERY  2021   Skin cancer   ESOPHAGEAL VARICE LIGATION     EYE SURGERY     HEMICOLECTOMY  01/08/2003   IR RADIOLOGIST EVAL & MGMT  12/20/2020   IR RADIOLOGIST EVAL & MGMT   05/29/2021   LIVER SURGERY     shunts placed after chemo complication   SKIN FULL THICKNESS GRAFT N/A 09/12/2019   Procedure: debridement and FTSG to the nose from left upper arm;  Surgeon: Cindra Presume, MD;  Location: [facility];  Service: Plastics;  Laterality: N/A;  2 hours, please   TIPS PROCEDURE      OB History   No obstetric history on file.      Home Medications    Prior to Admission medications   Medication Sig Start Date End Date Taking? Authorizing Provider  Ascorbic Acid (VITAMIN C PO) Take 2,000 mg by mouth daily.    [provider]  AYR SALINE NASAL NA Place 1 spray into the nose daily as needed (congestion).    [provider]  Cholecalciferol (VITAMIN D3) 50 MCG (2000 UT) TABS Take 2,000 Units by mouth at bedtime.     [provider]  ELDERBERRY PO Take 15-30 mLs by mouth See admin instructions. Take 15 ml in the morning and 30 ml at night    [provider]  ENULOSE 10 GM/15ML SOLN Take 40 g by mouth 3 (three) times daily. 03/27/22   [provider]  furosemide (LASIX) 20 MG tablet Take 2 tablets (40 mg total)  by mouth daily. Hold until you see your liver doctor 05/05/22 05/05/23  Lorella Nimrod, MD  levOCARNitine (CARNITOR) 330 MG tablet Take 330 mg by mouth 3 (three) times daily. 05/23/22   [provider]  Multiple Vitamin (MULTI-VITAMIN) tablet Take 1 tablet by mouth daily.    [provider]  spironolactone (ALDACTONE) 50 MG tablet Take 2 tablets (100 mg total) by mouth daily. Hold until you see your liver doctor Patient taking differently: Take 25 mg by mouth daily. 05/05/22 05/05/23  Lorella Nimrod, MD  SYNTHROID 75 MCG tablet Take 1 tablet (75 mcg total) by mouth daily before breakfast. 09/04/22   Dutch Quint B, FNP  zinc gluconate 50 MG tablet Take 50 mg by mouth 3 (three) times daily.    [provider]    Family History Family History  Problem Relation Age of Onset    Arthritis Mother    Hearing loss Mother    Heart disease Mother    Hypertension Mother    Miscarriages / [place] Mother    Arthritis Father    Diabetes Father    Heart disease Father    Breast cancer Neg Hx    Colon cancer Neg Hx    Esophageal cancer Neg Hx    Pancreatic cancer Neg Hx    Stomach cancer Neg Hx     Social History Social History   Tobacco Use   Smoking status: Never   Smokeless tobacco: Never   Tobacco comments:    Never smoked  Vaping Use   Vaping Use: Never used  Substance Use Topics   Alcohol use: Never   Drug use: Never     Allergies   Contrast media [iodinated contrast media]   Review of Systems Review of Systems  Constitutional:  Negative for chills and fever.  Respiratory:  Negative for shortness of breath.   Cardiovascular:  Negative for chest pain.  Gastrointestinal:  Negative for abdominal pain, nausea and vomiting.  Genitourinary:  Positive for dysuria and frequency.  Musculoskeletal:  Negative for back pain.     Physical Exam Triage Vital Signs ED Triage Vitals  Enc Vitals Group     BP      Pulse      Resp      Temp      Temp src      SpO2      Weight      Height      Head Circumference      Peak Flow      Pain Score      Pain Loc      Pain Edu?      Excl. in [place]?    No data found.  Updated Vital Signs There were no vitals taken for this visit.     Physical Exam Vitals and nursing note reviewed.  Constitutional:      General: She is not in acute distress.    Appearance: Normal appearance. She is not ill-appearing.  HENT:     Head: Normocephalic and atraumatic.     Nose: Nose normal.  Cardiovascular:     Rate and Rhythm: Normal rate and regular rhythm.     Heart sounds: Normal heart sounds. No murmur heard. Pulmonary:     Effort: Pulmonary effort is normal. No respiratory distress.     Breath sounds: Normal breath sounds. No wheezing, rhonchi or rales.  Abdominal:     General: Abdomen is flat. Bowel  sounds are normal. There is no distension.  Palpations: Abdomen is soft.     Tenderness: There is no abdominal tenderness. There is no right CVA tenderness, left CVA tenderness or guarding.  Skin:    General: Skin is warm and dry.  Neurological:     Mental Status: She is alert.  Psychiatric:        Mood and Affect: Mood normal.        Thought Content: Thought content normal.      UC Treatments / Results  Labs (all labs ordered are listed, but only abnormal results are displayed) Labs Reviewed - No data to display  EKG   Radiology No results found.  Procedures Procedures (including critical care time)  Medications Ordered in UC Medications - No data to display  Initial Impression / Assessment and Plan / UC Course  I have reviewed the triage vital signs and the nursing notes.  Pertinent labs & imaging results that were available during my care of the patient were reviewed by me and considered in my medical decision making (see chart for details).     *** Final Clinical Impressions(s) / UC Diagnoses   Final diagnoses:  None   Discharge Instructions   None    ED Prescriptions   None    PDMP not reviewed this encounter.

## 2022-10-30 NOTE — ED Triage Notes (Signed)
Pt caregiver c/o confusion, fatigue, loudly praying for kids at school/"wackiness"   Concerned for UTI.   Onset ~ 2 days ago   Hx of UTI w/ similar sxs. Temp probe in triage unable to read temp family states that this is common her temp decreases beyond limits. Caregiver states this is her 4th UTI this year.

## 2022-10-30 NOTE — Telephone Encounter (Signed)
It would be negligent not to examine the pt and order appropriate labs/urine tests prior to prescribing a medication. Agree with recommendations. Pt does need to be seen more urgently so that this does develop into kidney infection if uti.

## 2022-10-31 ENCOUNTER — Encounter: Payer: Self-pay | Admitting: Physician Assistant

## 2022-11-02 ENCOUNTER — Ambulatory Visit
Admission: RE | Admit: 2022-11-02 | Discharge: 2022-11-02 | Disposition: A | Discharge status: Home / Self Care | Payer: Medicare Other | Source: Ambulatory Visit | Attending: Family Medicine | Admitting: Family Medicine

## 2022-11-02 DIAGNOSIS — Z1231 Encounter for screening mammogram for malignant neoplasm of breast: Secondary | ICD-10-CM | POA: Diagnosis not present

## 2022-11-02 LAB — URINE CULTURE: Culture: >=100000 — AB

## 2022-11-05 ENCOUNTER — Ambulatory Visit (INDEPENDENT_AMBULATORY_CARE_PROVIDER_SITE_OTHER): Payer: Medicare Other | Admitting: Family Medicine

## 2022-11-05 ENCOUNTER — Encounter: Payer: Self-pay | Admitting: Family Medicine

## 2022-11-05 VITALS — BP 110/70 | HR 61 | Temp 97.0°F | Ht 62.75 in | Wt 160.0 lb

## 2022-11-05 DIAGNOSIS — N3090 Cystitis, unspecified without hematuria: Secondary | ICD-10-CM | POA: Diagnosis not present

## 2022-11-05 MED ORDER — SPIRONOLACTONE 50 MG PO TABS: 25.0000 mg | ORAL_TABLET | Freq: Every day | ORAL | Status: DC

## 2022-11-05 MED ORDER — NITROFURANTOIN MONOHYD MACRO 100 MG PO CAPS: 100.0000 mg | ORAL_CAPSULE | Freq: Two times a day (BID) | ORAL | 0 refills | Status: DC

## 2022-11-05 NOTE — Progress Notes (Signed)
Here today with husband.  Ucx positive, done with abx as of yesterday.  Discussed.  Confusion improved but not back to baseline yet.  No abd pain.  No subjective chills/fevers.  No burning with urination.    She has f/u pending with Dr. Louis Meckel with urology.    Meds, vitals, and allergies reviewed.   ROS: Per HPI unless specifically indicated in ROS section   Nad Ncat Neck supple, no LA Rrr Ctab Abd not ttp 1+ BLE edema.

## 2022-11-05 NOTE — Patient Instructions (Signed)
Tell Dr. Louis Meckel about partial improvement with macrobid with the plan for 5 more days of medicine as of 11/05/22.  Take care.  Glad to see you.

## 2022-11-08 NOTE — Assessment & Plan Note (Signed)
Discussed options. Restart Macrobid.  I asked her to update Dr. Louis Meckel about partial improvement with macrobid with the plan for 5 more days of medicine as of 11/05/22.  She has urology follow-up pending.  She agrees.  Okay for outpatient follow-up.

## 2022-11-09 DIAGNOSIS — N3020 Other chronic cystitis without hematuria: Secondary | ICD-10-CM | POA: Diagnosis not present

## 2022-11-10 ENCOUNTER — Ambulatory Visit
Admission: RE | Admit: 2022-11-10 | Discharge: 2022-11-10 | Disposition: A | Discharge status: Home / Self Care | Payer: Medicare Other | Source: Ambulatory Visit | Attending: Nurse Practitioner | Admitting: Nurse Practitioner

## 2022-11-10 DIAGNOSIS — K7469 Other cirrhosis of liver: Secondary | ICD-10-CM

## 2022-11-10 DIAGNOSIS — I8500 Esophageal varices without bleeding: Secondary | ICD-10-CM

## 2022-11-10 DIAGNOSIS — K8020 Calculus of gallbladder without cholecystitis without obstruction: Secondary | ICD-10-CM | POA: Diagnosis not present

## 2022-11-10 DIAGNOSIS — K7682 Hepatic encephalopathy: Secondary | ICD-10-CM

## 2022-11-10 DIAGNOSIS — K766 Portal hypertension: Secondary | ICD-10-CM

## 2022-11-10 DIAGNOSIS — K7460 Unspecified cirrhosis of liver: Secondary | ICD-10-CM | POA: Diagnosis not present

## 2022-11-14 ENCOUNTER — Ambulatory Visit
Admission: EM | Admit: 2022-11-14 | Discharge: 2022-11-14 | Disposition: A | Discharge status: Home / Self Care | Payer: Medicare Other | Attending: Physician Assistant | Admitting: Physician Assistant

## 2022-11-14 DIAGNOSIS — J209 Acute bronchitis, unspecified: Secondary | ICD-10-CM

## 2022-11-14 MED ORDER — CEPHALEXIN 500 MG PO CAPS: 500.0000 mg | ORAL_CAPSULE | Freq: Two times a day (BID) | ORAL | 0 refills | Status: AC

## 2022-11-14 MED ORDER — ALBUTEROL SULFATE HFA 108 (90 BASE) MCG/ACT IN AERS: 1.0000 | INHALATION_SPRAY | Freq: Four times a day (QID) | RESPIRATORY_TRACT | 0 refills | Status: DC | PRN

## 2022-11-14 NOTE — ED Triage Notes (Signed)
Pt presents with congestion and productive cough X 2 days

## 2022-11-15 ENCOUNTER — Encounter: Payer: Self-pay | Admitting: Physician Assistant

## 2022-11-15 NOTE — ED Provider Notes (Signed)
[name]    CSN: [id number] Arrival date & time: 11/14/22  8832      History   Chief Complaint Chief Complaint  Patient presents with   URI    HPI [name] is a 71 y.o. female.   Patient here today with husband for evaluation of congestion and productive cough she had for 2 days.  Husband is worried that cough may be turning into pneumonia as she has had same in the past.  He reports that she had difficulty sleeping due to cough last night.  Patient does have significant past medical history including history of cancer and liver dysfunction due to chemotherapy.  The history is provided by the patient and the spouse.  URI Presenting symptoms: congestion and cough   Presenting symptoms: no ear pain, no fever and no sore throat   Associated symptoms: no wheezing     Past Medical History:  Diagnosis Date   Acute urinary retention 05/04/2022   Allergy 2006 ?   Contrast dye   Arthritis 2016 ??   Knees and thumb   Cancer ([place])    cecum   Cataract 2021   Surgery scheduled July 2023   Colon cancer [facility]) 2003   Elevated liver function tests    Esophageal varices (HCC)    Heart murmur On file   Hemorrhage of gastrointestinal tract 05/04/2011   Hypertension 2021   Hypothyroidism    Iron deficiency anemia    Liver disease    chemotherapy complication, per pt, shunts placed to bypass liver   Malignant neoplasm of cecum ([place])    Portal hypertension ([place])    Skin cancer 2019   Splenomegaly     Patient Active Problem List   Diagnosis Date Noted   Confusion 08/26/2022   Cystitis 08/26/2022   Recurrent UTI 05/18/2022   Anemia 05/18/2022   Bacteremia 05/04/2022   Hypernatremia 05/04/2022   Sinus bradycardia 02/05/2022   Hepatic encephalopathy ([place]) 02/03/2022   Basal cell carcinoma (BCC) 05/06/2021   Cecal cancer ([place]) 05/06/2021   Hypertension 05/06/2021   COVID-19 vaccination declined 01/30/2021   Decompensated liver cirrhosis with portal HTN  and gastric varices 12/04/2020   Essential hypertension 08/12/2020   Murmur, cardiac 03/19/2020   Chronic pain of both knees 01/29/2020   Hypothyroidism 06/30/2019   History of basal cell cancer 06/30/2019   Hx of colon cancer, stage III 11/30/2011   Esophageal varices ([place]) 06/12/2010   Portal hypertension ([place]) 01/23/2008    Past Surgical History:  Procedure Laterality Date   COLON SURGERY  2004   Cancer   COSMETIC SURGERY  2021   Skin cancer   ESOPHAGEAL VARICE LIGATION     EYE SURGERY     HEMICOLECTOMY  01/08/2003   IR RADIOLOGIST EVAL & MGMT  12/20/2020   IR RADIOLOGIST EVAL & MGMT  05/29/2021   LIVER SURGERY     shunts placed after chemo complication   SKIN FULL THICKNESS GRAFT N/A 09/12/2019   Procedure: debridement and FTSG to the nose from left upper arm;  Surgeon: Cindra Presume, MD;  Location: [facility];  Service: Plastics;  Laterality: N/A;  2 hours, please   TIPS PROCEDURE      OB History   No obstetric history on file.      Home Medications    Prior to Admission medications   Medication Sig Start Date End Date Taking? Authorizing Provider  albuterol (VENTOLIN HFA) 108 (90 Base) MCG/ACT inhaler Inhale 1-2  puffs into the lungs every 6 (six) hours as needed for wheezing or shortness of breath. 11/14/22  Yes Francene Finders, PA-C  cephALEXin (KEFLEX) 500 MG capsule Take 1 capsule (500 mg total) by mouth 2 (two) times daily for 7 days. 11/14/22 11/21/22 Yes Francene Finders, PA-C  Ascorbic Acid (VITAMIN C PO) Take 2,000 mg by mouth daily.    [provider]  AYR SALINE NASAL NA Place 1 spray into the nose daily as needed (congestion).    [provider]  Cholecalciferol (VITAMIN D3) 50 MCG (2000 UT) TABS Take 2,000 Units by mouth at bedtime.     [provider]  ELDERBERRY PO Take 15-30 mLs by mouth See admin instructions. Take 15 ml in the morning and 30 ml at night    [provider]  furosemide (LASIX) 20  MG tablet Take 2 tablets (40 mg total) by mouth daily. Hold until you see your liver doctor 05/05/22 05/05/23  Lorella Nimrod, MD  lactulose (CHRONULAC) 10 GM/15ML solution Take 30 g by mouth 3 (three) times daily. 08/13/22   [provider]  levOCARNitine (CARNITOR) 330 MG tablet Take 330 mg by mouth 3 (three) times daily. 05/23/22   [provider]  Multiple Vitamin (MULTI-VITAMIN) tablet Take 1 tablet by mouth daily.    [provider]  nitrofurantoin, macrocrystal-monohydrate, (MACROBID) 100 MG capsule Take 1 capsule (100 mg total) by mouth 2 (two) times daily. 11/05/22   Tonia Ghent, MD  spironolactone (ALDACTONE) 50 MG tablet Take 0.5 tablets (25 mg total) by mouth daily. 11/05/22 11/05/23  Tonia Ghent, MD  SYNTHROID 75 MCG tablet Take 1 tablet (75 mcg total) by mouth daily before breakfast. 09/04/22   Dutch Quint B, FNP  zinc gluconate 50 MG tablet Take 50 mg by mouth 3 (three) times daily.    [provider]    Family History Family History  Problem Relation Age of Onset   Arthritis Mother    Hearing loss Mother    Heart disease Mother    Hypertension Mother    Miscarriages / [place] Mother    Arthritis Father    Diabetes Father    Heart disease Father    Breast cancer Neg Hx    Colon cancer Neg Hx    Esophageal cancer Neg Hx    Pancreatic cancer Neg Hx    Stomach cancer Neg Hx     Social History Social History   Tobacco Use   Smoking status: Never   Smokeless tobacco: Never   Tobacco comments:    Never smoked  Vaping Use   Vaping Use: Never used  Substance Use Topics   Alcohol use: Never   Drug use: Never     Allergies   Contrast media [iodinated contrast media]   Review of Systems Review of Systems  Constitutional:  Negative for chills and fever.  HENT:  Positive for congestion. Negative for ear pain and sore throat.   Eyes:  Negative for discharge and redness.  Respiratory:  Positive for cough. Negative for  shortness of breath and wheezing.   Gastrointestinal:  Negative for abdominal pain, diarrhea, nausea and vomiting.     Physical Exam Triage Vital Signs ED Triage Vitals  Enc Vitals Group     BP 11/14/22 1220 118/76     Pulse Rate 11/14/22 1220 63     Resp 11/14/22 1220 17     Temp 11/14/22 1220 97.8 F (36.6 C)     Temp  Source 11/14/22 1220 Temporal     SpO2 11/14/22 1220 94 %     Weight --      Height --      Head Circumference --      Peak Flow --      Pain Score 11/14/22 1219 1     Pain Loc --      Pain Edu? --      Excl. in [place]? --    No data found.  Updated Vital Signs BP 118/76 (BP Location: Left Arm)   Pulse 63   Temp 97.8 F (36.6 C) (Temporal)   Resp 17   SpO2 94%      Physical Exam Vitals and nursing note reviewed.  Constitutional:      General: She is not in acute distress.    Appearance: Normal appearance. She is not ill-appearing.  HENT:     Head: Normocephalic and atraumatic.     Nose: Congestion present.     Mouth/Throat:     Mouth: Mucous membranes are moist.     Pharynx: No oropharyngeal exudate or posterior oropharyngeal erythema.  Eyes:     Conjunctiva/sclera: Conjunctivae normal.  Cardiovascular:     Rate and Rhythm: Normal rate and regular rhythm.     Heart sounds: Normal heart sounds. No murmur heard. Pulmonary:     Effort: Pulmonary effort is normal. No respiratory distress.     Breath sounds: Normal breath sounds. No wheezing, rhonchi or rales.  Skin:    General: Skin is warm and dry.  Neurological:     Mental Status: She is alert.  Psychiatric:        Mood and Affect: Mood normal.        Thought Content: Thought content normal.      UC Treatments / Results  Labs (all labs ordered are listed, but only abnormal results are displayed) Labs Reviewed - No data to display  EKG   Radiology No results found.  Procedures Procedures (including critical care time)  Medications Ordered in UC Medications - No data to  display  Initial Impression / Assessment and Plan / UC Course  I have reviewed the triage vital signs and the nursing notes.  Pertinent labs & imaging results that were available during my care of the patient were reviewed by me and considered in my medical decision making (see chart for details).    Suspect likely developing bronchitis given reported worsening cough and wheezing.  Will treat with albuterol inhaler as well as Keflex given past medical history and concern for possible infection.  Recommended follow-up if no gradual improvement or with any further concerns.  Patient and husband expressed understanding.  Final Clinical Impressions(s) / UC Diagnoses   Final diagnoses:  Acute bronchitis, unspecified organism   Discharge Instructions   None    ED Prescriptions     Medication Sig Dispense Auth. Provider   albuterol (VENTOLIN HFA) 108 (90 Base) MCG/ACT inhaler Inhale 1-2 puffs into the lungs every 6 (six) hours as needed for wheezing or shortness of breath. 8 g Ewell Poe F, PA-C   cephALEXin (KEFLEX) 500 MG capsule Take 1 capsule (500 mg total) by mouth 2 (two) times daily for 7 days. 14 capsule Francene Finders, PA-C      PDMP not reviewed this encounter.   Francene Finders, PA-C 11/15/22 1027

## 2022-11-18 DIAGNOSIS — L718 Other rosacea: Secondary | ICD-10-CM | POA: Diagnosis not present

## 2022-11-18 DIAGNOSIS — H00019 Hordeolum externum unspecified eye, unspecified eyelid: Secondary | ICD-10-CM | POA: Diagnosis not present

## 2022-12-03 ENCOUNTER — Telehealth: Payer: Self-pay | Admitting: Family Medicine

## 2022-12-03 DIAGNOSIS — E039 Hypothyroidism, unspecified: Secondary | ICD-10-CM

## 2022-12-03 MED ORDER — SYNTHROID 75 MCG PO TABS: 75.0000 ug | ORAL_TABLET | Freq: Every day | ORAL | 0 refills | Status: DC

## 2022-12-03 NOTE — Telephone Encounter (Signed)
Prescription Request  12/03/2022  Is this a "Controlled Substance" medicine? No  LOV: 06/12/2022  What is the name of the medication or equipment? SYNTHROID 75 MCG tablet   Have you contacted your pharmacy to request a refill? Yes   Which pharmacy would you like this sent to?  [facility] - [place], [place] [address] Phone: [phone] Fax: [phone]   Patient notified that their request is being sent to the clinical staff for review and that they should receive a response within 2 business days.   Please advise at Mobile [phone] (mobile)

## 2022-12-17 DIAGNOSIS — N3020 Other chronic cystitis without hematuria: Secondary | ICD-10-CM | POA: Diagnosis not present

## 2022-12-18 DIAGNOSIS — N3020 Other chronic cystitis without hematuria: Secondary | ICD-10-CM | POA: Diagnosis not present

## 2022-12-24 ENCOUNTER — Encounter: Payer: Medicare Other | Admitting: Family

## 2022-12-25 DIAGNOSIS — N3020 Other chronic cystitis without hematuria: Secondary | ICD-10-CM | POA: Diagnosis not present

## 2023-01-01 DIAGNOSIS — N3020 Other chronic cystitis without hematuria: Secondary | ICD-10-CM | POA: Diagnosis not present

## 2023-01-04 ENCOUNTER — Ambulatory Visit (INDEPENDENT_AMBULATORY_CARE_PROVIDER_SITE_OTHER): Payer: Medicare Other | Admitting: Nurse Practitioner

## 2023-01-04 ENCOUNTER — Encounter: Payer: Self-pay | Admitting: Nurse Practitioner

## 2023-01-04 ENCOUNTER — Encounter: Payer: Medicare Other | Admitting: Nurse Practitioner

## 2023-01-04 VITALS — BP 114/68 | HR 62 | Temp 97.5°F | Resp 16 | Ht 62.75 in | Wt 154.5 lb

## 2023-01-04 DIAGNOSIS — K7460 Unspecified cirrhosis of liver: Secondary | ICD-10-CM

## 2023-01-04 DIAGNOSIS — I10 Essential (primary) hypertension: Secondary | ICD-10-CM | POA: Diagnosis not present

## 2023-01-04 DIAGNOSIS — R531 Weakness: Secondary | ICD-10-CM | POA: Insufficient documentation

## 2023-01-04 DIAGNOSIS — E039 Hypothyroidism, unspecified: Secondary | ICD-10-CM

## 2023-01-04 DIAGNOSIS — R5383 Other fatigue: Secondary | ICD-10-CM

## 2023-01-04 DIAGNOSIS — N390 Urinary tract infection, site not specified: Secondary | ICD-10-CM

## 2023-01-04 LAB — POCT URINALYSIS DIPSTICK
Bilirubin, UA: NEGATIVE
Blood, UA: NEGATIVE
Glucose, UA: NEGATIVE
Ketones, UA: NEGATIVE
Leukocytes, UA: NEGATIVE
Nitrite, UA: NEGATIVE
Protein, UA: NEGATIVE
Spec Grav, UA: 1.015
Urobilinogen, UA: 0.2 U/dL
pH, UA: 6

## 2023-01-04 NOTE — Patient Instructions (Signed)
Nice to see you today I will be in touch with the labs once I have them Follow up with me in 6 months for your physical, sooner if you need me

## 2023-01-04 NOTE — Progress Notes (Signed)
Established Patient Office Visit  Subjective   Patient ID: [name], female    DOB: 17-Mar-1951  Age: 72 y.o. MRN: [id number]  Chief Complaint  Patient presents with   Establish Care    Transferring  from Dr. Einar Pheasant    HPI  Transfer of care: Last seen by Elsie Stain on 11/05/2022 for Cysitis  Then by me on 08/26/2022 Annual exam on 06/12/2022 with Waunita Schooner, MD  HTN: States that BP has always ben on the low side. After an incident and had a high blood pressures. Life style management. States  Liver cirrhosis: Dr  Alfonso Patten. Every 6 months.   Hypothyroidism: states that she synthroid   Hx of basal cell carcinoma: [facility]   States that starting on Friday and through the weekend she was awke for approx 4 hours. States that they did she the urologist on Friday and her urine was ok. States that her liver and thyroid. States that he did cut back on lactulose because he thought she was having diarrhea. But states that she was having constipation    Tdap: never Flu: refused Covid:J&J Pna: info given Shingles  Colonoscopy: over due. States that followed [facility].  Pap smear: aged out  Mammogram : 10/2022      Review of Systems  Constitutional:  Positive for malaise/fatigue. Negative for chills and fever.  HENT:  Negative for congestion, ear discharge, ear pain, sinus pain and sore throat.   Respiratory:  Negative for cough and shortness of breath.   Gastrointestinal:  Negative for abdominal pain, constipation, diarrhea, nausea and vomiting.  Genitourinary:  Negative for dysuria and hematuria.  Musculoskeletal:  Negative for joint pain and myalgias.  Neurological:  Negative for headaches.      Objective:     BP 114/68   Pulse 62   Temp (!) 97.5 F (36.4 C)   Resp 16   Ht 5' 2.75" (1.594 m)   Wt 154 lb 8 oz (70.1 kg)   SpO2 97%   BMI 27.59 kg/m    Physical Exam Vitals and nursing note reviewed.  HENT:     Right Ear: Tympanic membrane, ear  canal and external ear normal.     Left Ear: Tympanic membrane, ear canal and external ear normal.  Neck:     Thyroid: No thyroid mass or thyromegaly.  Cardiovascular:     Rate and Rhythm: Normal rate and regular rhythm.  Pulmonary:     Effort: Pulmonary effort is normal.     Breath sounds: Normal breath sounds.  Abdominal:     General: Bowel sounds are normal. There is no distension.     Palpations: There is no mass.     Tenderness: There is no abdominal tenderness. There is no right CVA tenderness or left CVA tenderness.     Hernia: No hernia is present.  Musculoskeletal:     Right lower leg: Edema present.     Left lower leg: Edema present.  Lymphadenopathy:     Cervical: No cervical adenopathy.      Results for orders placed or performed in visit on 01/04/23  POCT urinalysis dipstick  Result Value Ref Range   Color, UA yellow    Clarity, UA clear    Glucose, UA Negative Negative   Bilirubin, UA Negative    Ketones, UA Negative    Spec Grav, UA 1.015 1.010 - 1.025   Blood, UA Negative    pH, UA 6.0 5.0 - 8.0  Protein, UA Negative Negative   Urobilinogen, UA 0.2 0.2 or 1.0 E.U./dL   Nitrite, UA Negative    Leukocytes, UA Negative Negative   Appearance     Odor        The ASCVD Risk score (Arnett DK, et al., 2019) failed to calculate for the following reasons:   Cannot find a previous HDL lab   Cannot find a previous total cholesterol lab    Assessment & Plan:   Problem List Items Addressed This Visit       Cardiovascular and Mediastinum   Essential hypertension - Primary    States that was transient.  Patient currently maintained on diet and lifestyle modifications slowly.  Blood pressure within normal limits.      Relevant Orders   CBC   Comprehensive metabolic panel     Digestive   Decompensated liver cirrhosis with portal HTN and gastric varices    Patient is followed by liver clinic.  She is having some fatigue and change in function and her  lactulose was decreased over the weekend because patient's spouse thought she was having diarrhea will check ammonia level pending result      Relevant Orders   CBC   Comprehensive metabolic panel   Ammonia     Endocrine   Hypothyroidism    Patient on Synthroid 75 mcg.  Pending TSH      Relevant Orders   TSH     Genitourinary   Recurrent UTI    History of the same urinalysis clean in office pending urinary culture she is being followed by neurology at this juncture      Relevant Orders   Urine Culture     Other   Fatigue    Ambiguous in the change in patient's function.  Will check basic lab work to include CBC CMP.  Do vitamin D along with ammonia and urine culture      Relevant Orders   TSH   VITAMIN D 25 Hydroxy (Vit-D Deficiency, Fractures)   POCT urinalysis dipstick (Completed)    Return in about 6 months (around 07/05/2023) for CPE and Labs.    Romilda Garret, NP

## 2023-01-04 NOTE — Assessment & Plan Note (Signed)
Patient is followed by liver clinic.  She is having some fatigue and change in function and her lactulose was decreased over the weekend because patient's spouse thought she was having diarrhea will check ammonia level pending result

## 2023-01-04 NOTE — Assessment & Plan Note (Signed)
Patient on Synthroid 75 mcg.  Pending TSH

## 2023-01-04 NOTE — Assessment & Plan Note (Signed)
States that was transient.  Patient currently maintained on diet and lifestyle modifications slowly.  Blood pressure within normal limits.

## 2023-01-04 NOTE — Assessment & Plan Note (Signed)
History of the same urinalysis clean in office pending urinary culture she is being followed by neurology at this juncture

## 2023-01-04 NOTE — Assessment & Plan Note (Signed)
Ambiguous in the change in patient's function.  Will check basic lab work to include CBC CMP.  Do vitamin D along with ammonia and urine culture

## 2023-01-05 ENCOUNTER — Telehealth: Payer: Self-pay | Admitting: *Deleted

## 2023-01-05 DIAGNOSIS — E673 Hypervitaminosis D: Secondary | ICD-10-CM

## 2023-01-05 LAB — TSH: TSH: 0.98 u[IU]/mL (ref 0.35–5.50)

## 2023-01-05 LAB — URINE CULTURE
MICRO NUMBER:: 14552135
SPECIMEN QUALITY:: ADEQUATE

## 2023-01-05 LAB — COMPREHENSIVE METABOLIC PANEL
ALT: 55 U/L — ABNORMAL HIGH (ref 0–35)
AST: 80 U/L — ABNORMAL HIGH (ref 0–37)
Albumin: 2.9 g/dL — ABNORMAL LOW (ref 3.5–5.2)
Alkaline Phosphatase: 282 U/L — ABNORMAL HIGH (ref 39–117)
BUN: 17 mg/dL (ref 6–23)
CO2: 27 mEq/L (ref 19–32)
Calcium: 8.9 mg/dL (ref 8.4–10.5)
Chloride: 101 mEq/L (ref 96–112)
Creatinine, Ser: 0.8 mg/dL (ref 0.40–1.20)
GFR: 74.03 mL/min (ref >60.00)
Glucose, Bld: 76 mg/dL (ref 70–99)
Potassium: 3.6 mEq/L (ref 3.5–5.1)
Sodium: 136 mEq/L (ref 135–145)
Total Bilirubin: 1.1 mg/dL (ref 0.2–1.2)
Total Protein: 6.1 g/dL (ref 6.0–8.3)

## 2023-01-05 LAB — CBC
HCT: 34.5 % — ABNORMAL LOW (ref 36.0–46.0)
Hemoglobin: 11.8 g/dL — ABNORMAL LOW (ref 12.0–15.0)
MCHC: 34.2 g/dL (ref 30.0–36.0)
MCV: 101.4 fl — ABNORMAL HIGH (ref 78.0–100.0)
Platelets: 84 10*3/uL — ABNORMAL LOW (ref 150.0–400.0)
RBC: 3.4 Mil/uL — ABNORMAL LOW (ref 3.87–5.11)
RDW: 16.8 % — ABNORMAL HIGH (ref 11.5–15.5)
WBC: 3.5 10*3/uL — ABNORMAL LOW (ref 4.0–10.5)

## 2023-01-05 LAB — AMMONIA: Ammonia: 51 umol/L (ref <=72)

## 2023-01-05 LAB — VITAMIN D 25 HYDROXY (VIT D DEFICIENCY, FRACTURES): VITD: 114.81 ng/mL (ref 30.00–100.00)

## 2023-01-05 NOTE — Telephone Encounter (Signed)
Called and informed pt to stop taking Vitamin D and also made her a lab appointment for 02/03/2023

## 2023-01-05 NOTE — Telephone Encounter (Signed)
Can we call the patient and ask that she stop taking the over the counter vitamin D and recheck it in one month please. Lab orders placed

## 2023-01-05 NOTE — Telephone Encounter (Signed)
Received a call from [facility] at [facility] lab that she has a critical lab on patient Vitamin D 114.8 Romilda Garret NP advised.

## 2023-01-29 DIAGNOSIS — M25532 Pain in left wrist: Secondary | ICD-10-CM | POA: Diagnosis not present

## 2023-02-02 DIAGNOSIS — M25532 Pain in left wrist: Secondary | ICD-10-CM | POA: Diagnosis not present

## 2023-02-03 ENCOUNTER — Telehealth: Payer: Self-pay

## 2023-02-03 ENCOUNTER — Other Ambulatory Visit (INDEPENDENT_AMBULATORY_CARE_PROVIDER_SITE_OTHER): Payer: Medicare Other

## 2023-02-03 DIAGNOSIS — E673 Hypervitaminosis D: Secondary | ICD-10-CM

## 2023-02-03 NOTE — Telephone Encounter (Signed)
Pt in office for lab appointment.  Spoke with [place], husband reports some cognitive changes with wife.  Pt had a fall on 01/29/23 broke right wrist.  Symptoms he is seeing: increased fatigue, involuntary jerking of left arm, increased confusion.  He reports that pt is also aware of her confusion. Husband not sure if these changes are liver related or UTI related. Pt currently taking  oxycodone for pain, he has backed off how often its being given now that pts pain is somewhat better.  He reports that pt has had some BMs with lactulose. Appointment made for 02/04/23 @ 8:40a.

## 2023-02-03 NOTE — Telephone Encounter (Signed)
Noted will evaluate in office 

## 2023-02-04 ENCOUNTER — Ambulatory Visit (INDEPENDENT_AMBULATORY_CARE_PROVIDER_SITE_OTHER): Payer: Medicare Other | Admitting: Nurse Practitioner

## 2023-02-04 ENCOUNTER — Encounter: Payer: Self-pay | Admitting: Nurse Practitioner

## 2023-02-04 VITALS — BP 118/68 | HR 72 | Temp 97.8°F | Resp 16 | Ht 62.75 in | Wt 157.0 lb

## 2023-02-04 DIAGNOSIS — K7460 Unspecified cirrhosis of liver: Secondary | ICD-10-CM | POA: Diagnosis not present

## 2023-02-04 DIAGNOSIS — N390 Urinary tract infection, site not specified: Secondary | ICD-10-CM | POA: Diagnosis not present

## 2023-02-04 DIAGNOSIS — R410 Disorientation, unspecified: Secondary | ICD-10-CM | POA: Diagnosis not present

## 2023-02-04 LAB — COMPREHENSIVE METABOLIC PANEL
ALT: 62 U/L — ABNORMAL HIGH (ref 0–35)
AST: 92 U/L — ABNORMAL HIGH (ref 0–37)
Albumin: 2.8 g/dL — ABNORMAL LOW (ref 3.5–5.2)
Alkaline Phosphatase: 274 U/L — ABNORMAL HIGH (ref 39–117)
BUN: 23 mg/dL (ref 6–23)
CO2: 30 mEq/L (ref 19–32)
Calcium: 9.5 mg/dL (ref 8.4–10.5)
Chloride: 104 mEq/L (ref 96–112)
Creatinine, Ser: 0.75 mg/dL (ref 0.40–1.20)
GFR: 79.95 mL/min (ref >60.00)
Glucose, Bld: 79 mg/dL (ref 70–99)
Potassium: 4 mEq/L (ref 3.5–5.1)
Sodium: 140 mEq/L (ref 135–145)
Total Bilirubin: 1.4 mg/dL — ABNORMAL HIGH (ref 0.2–1.2)
Total Protein: 6.8 g/dL (ref 6.0–8.3)

## 2023-02-04 LAB — POC URINALSYSI DIPSTICK (AUTOMATED)
Bilirubin, UA: NEGATIVE
Blood, UA: NEGATIVE
Glucose, UA: NEGATIVE
Ketones, UA: NEGATIVE
Leukocytes, UA: NEGATIVE
Nitrite, UA: NEGATIVE
Protein, UA: NEGATIVE
Spec Grav, UA: 1.01 (ref 1.010–1.025)
Urobilinogen, UA: 0.2 E.U./dL
pH, UA: 7.5 (ref 5.0–8.0)

## 2023-02-04 LAB — CBC
HCT: 34 % — ABNORMAL LOW (ref 36.0–46.0)
Hemoglobin: 11.6 g/dL — ABNORMAL LOW (ref 12.0–15.0)
MCHC: 34 g/dL (ref 30.0–36.0)
MCV: 101.7 fl — ABNORMAL HIGH (ref 78.0–100.0)
Platelets: 89 10*3/uL — ABNORMAL LOW (ref 150.0–400.0)
RBC: 3.34 Mil/uL — ABNORMAL LOW (ref 3.87–5.11)
RDW: 18 % — ABNORMAL HIGH (ref 11.5–15.5)
WBC: 4.8 10*3/uL (ref 4.0–10.5)

## 2023-02-04 LAB — AMMONIA: Ammonia: 83 umol/L — ABNORMAL HIGH (ref 11–35)

## 2023-02-04 LAB — VITAMIN D 25 HYDROXY (VIT D DEFICIENCY, FRACTURES): VITD: 99.44 ng/mL (ref 30.00–100.00)

## 2023-02-04 NOTE — Assessment & Plan Note (Signed)
Multifactorial given decompensated liver failure along with frequent UTIs.  Pending urine culture and lab work neurologically intact do not need a CT at this juncture.  If all lab work comes back benign consider CT of head

## 2023-02-04 NOTE — Progress Notes (Signed)
Acute Office Visit  Subjective:     Patient ID: [name], female    DOB: [birth year], 72 y.o.   MRN: [id number]  Chief Complaint  Patient presents with   Altered Mental Status     Patient is in today for Altered mental status Patient is present with her husband who aids in history taking  States that they first noitced the change on Sunday to Monday that was a subtle change, nothing remarkably acute or quick  States that she fell at work and injuried her left arm. She was seen at ortho and is currently casted. States that they were given plain oxycodone. She did clear it through the liver clinic prior to starting the medication.  In the beginning they were doing 1/2 tablet and was not doing so well so they called and increased half tablet 4 hours.  States that she was having difficulty to sleep and then was sleeping well/easy. States that it has  been a few days, since mid day Monday, since the last dose of the oxycodone  States that when they increased the oxycocodne to 0.5 pill evey 4 hours. That is when he noticed the difference.   States that she has been having good large BMs with her lactulose. States that she did not have one yesterday.  Review of Systems  Constitutional:  Negative for chills and fever.  Respiratory:  Negative for shortness of breath.   Cardiovascular:  Negative for chest pain.  Neurological:  Negative for headaches.  Psychiatric/Behavioral:  Negative for hallucinations and suicidal ideas.        "+" confusion        Objective:    BP 118/68   Pulse 72   Temp 97.8 F (36.6 C)   Resp 16   Ht 5' 2.75" (1.594 m)   Wt 157 lb (71.2 kg)   SpO2 97%   BMI 28.03 kg/m    Physical Exam Vitals and nursing note reviewed.  Constitutional:      Appearance: Normal appearance.  Cardiovascular:     Rate and Rhythm: Normal rate and regular rhythm.     Heart sounds: Normal heart sounds.  Pulmonary:     Effort: Pulmonary effort is normal.      Breath sounds: Normal breath sounds.  Neurological:     General: No focal deficit present.     Mental Status: She is alert and oriented to person, place, and time.     Cranial Nerves: Cranial nerves 2-12 are intact.     Sensory: Sensation is intact.     Coordination: Coordination is intact.     Gait: Gait is intact.     Deep Tendon Reflexes:     Reflex Scores:      Bicep reflexes are 3+ on the right side.      Patellar reflexes are 1+ on the right side and 1+ on the left side.    Comments: Bilateral lower extremities 5/5  RUE 5/5 LUE unable to test due to fracture and cast  Patient was alert and oriented to herself, day of the week, date, year, president.     Results for orders placed or performed in visit on 02/04/23  CBC  Result Value Ref Range   WBC 4.8 4.0 - 10.5 K/uL   RBC 3.34 (L) 3.87 - 5.11 Mil/uL   Platelets 89.0 (L) 150.0 - 400.0 K/uL   Hemoglobin 11.6 (L) 12.0 - 15.0 g/dL   HCT 34.0 (L) 36.0 - 46.0 %  MCV 101.7 (H) 78.0 - 100.0 fl   MCHC 34.0 30.0 - 36.0 g/dL   RDW 18.0 (H) 11.5 - 15.5 %  Comprehensive metabolic panel  Result Value Ref Range   Sodium 140 135 - 145 mEq/L   Potassium 4.0 3.5 - 5.1 mEq/L   Chloride 104 96 - 112 mEq/L   CO2 30 19 - 32 mEq/L   Glucose, Bld 79 70 - 99 mg/dL   BUN 23 6 - 23 mg/dL   Creatinine, Ser 0.75 0.40 - 1.20 mg/dL   Total Bilirubin 1.4 (H) 0.2 - 1.2 mg/dL   Alkaline Phosphatase 274 (H) 39 - 117 U/L   AST 92 (H) 0 - 37 U/L   ALT 62 (H) 0 - 35 U/L   Total Protein 6.8 6.0 - 8.3 g/dL   Albumin 2.8 (L) 3.5 - 5.2 g/dL   GFR 79.95 >60.00 mL/min   Calcium 9.5 8.4 - 10.5 mg/dL  Ammonia  Result Value Ref Range   Ammonia 83 (H) 11 - 35 umol/L  POCT Urinalysis Dipstick (Automated)  Result Value Ref Range   Color, UA yellow    Clarity, UA clear    Glucose, UA Negative Negative   Bilirubin, UA Negative    Ketones, UA Negative    Spec Grav, UA 1.010 1.010 - 1.025   Blood, UA Negative    pH, UA 7.5 5.0 - 8.0   Protein, UA  Negative Negative   Urobilinogen, UA 0.2 0.2 or 1.0 E.U./dL   Nitrite, UA Negative    Leukocytes, UA Negative Negative        Assessment & Plan:   Problem List Items Addressed This Visit       Digestive   Decompensated liver cirrhosis with portal HTN and gastric varices    Patient is followed through the liver clinic and currently taking lactulose.  Concern for confusion will be ammonia buildup pending result.  Continue taking medication as prescribed follow-up liver clinic as recommended      Relevant Orders   CBC (Completed)   Comprehensive metabolic panel (Completed)   Ammonia (Completed)     Nervous and Auditory   Confusion - Primary    Multifactorial given decompensated liver failure along with frequent UTIs.  Pending urine culture and lab work neurologically intact do not need a CT at this juncture.  If all lab work comes back benign consider CT of head      Relevant Orders   POCT Urinalysis Dipstick (Automated) (Completed)   CBC (Completed)   Comprehensive metabolic panel (Completed)   Ammonia (Completed)   Urine Culture     Genitourinary   Frequent UTI    History of frequent recurrent UTIs.  UA negative in office will send off for urine culture      Relevant Orders   Urine Culture    No orders of the defined types were placed in this encounter.   Return if symptoms worsen or fail to improve.  Romilda Garret, NP

## 2023-02-04 NOTE — Assessment & Plan Note (Signed)
Patient is followed through the liver clinic and currently taking lactulose.  Concern for confusion will be ammonia buildup pending result.  Continue taking medication as prescribed follow-up liver clinic as recommended

## 2023-02-04 NOTE — Patient Instructions (Signed)
Nice to see you today I will be in touch with the labs once I have them   

## 2023-02-04 NOTE — Assessment & Plan Note (Signed)
History of frequent recurrent UTIs.  UA negative in office will send off for urine culture

## 2023-02-05 LAB — URINE CULTURE
MICRO NUMBER:: 14693275
Result:: NO GROWTH
SPECIMEN QUALITY:: ADEQUATE

## 2023-02-06 ENCOUNTER — Encounter: Payer: Self-pay | Admitting: Nurse Practitioner

## 2023-02-09 ENCOUNTER — Encounter: Payer: Self-pay | Admitting: *Deleted

## 2023-02-09 ENCOUNTER — Encounter (HOSPITAL_COMMUNITY): Payer: Self-pay

## 2023-02-09 ENCOUNTER — Emergency Department (HOSPITAL_COMMUNITY): Payer: Medicare Other

## 2023-02-09 ENCOUNTER — Other Ambulatory Visit: Payer: Self-pay | Admitting: Nurse Practitioner

## 2023-02-09 ENCOUNTER — Emergency Department (HOSPITAL_COMMUNITY)
Admission: EM | Admit: 2023-02-09 | Discharge: 2023-02-09 | Disposition: A | Discharge status: Home / Self Care | Payer: Medicare Other | Attending: Emergency Medicine | Admitting: Emergency Medicine

## 2023-02-09 ENCOUNTER — Other Ambulatory Visit: Payer: Self-pay

## 2023-02-09 ENCOUNTER — Ambulatory Visit
Admission: EM | Admit: 2023-02-09 | Discharge: 2023-02-09 | Disposition: A | Discharge status: Other Acute Inpatient Hospital | Payer: Medicare Other

## 2023-02-09 DIAGNOSIS — E039 Hypothyroidism, unspecified: Secondary | ICD-10-CM | POA: Insufficient documentation

## 2023-02-09 DIAGNOSIS — I10 Essential (primary) hypertension: Secondary | ICD-10-CM | POA: Diagnosis not present

## 2023-02-09 DIAGNOSIS — K8020 Calculus of gallbladder without cholecystitis without obstruction: Secondary | ICD-10-CM | POA: Diagnosis not present

## 2023-02-09 DIAGNOSIS — R4182 Altered mental status, unspecified: Secondary | ICD-10-CM

## 2023-02-09 DIAGNOSIS — K7460 Unspecified cirrhosis of liver: Secondary | ICD-10-CM | POA: Insufficient documentation

## 2023-02-09 DIAGNOSIS — J9811 Atelectasis: Secondary | ICD-10-CM | POA: Diagnosis not present

## 2023-02-09 DIAGNOSIS — K5900 Constipation, unspecified: Secondary | ICD-10-CM

## 2023-02-09 DIAGNOSIS — R410 Disorientation, unspecified: Secondary | ICD-10-CM

## 2023-02-09 DIAGNOSIS — Z85038 Personal history of other malignant neoplasm of large intestine: Secondary | ICD-10-CM | POA: Insufficient documentation

## 2023-02-09 DIAGNOSIS — Z79890 Hormone replacement therapy (postmenopausal): Secondary | ICD-10-CM | POA: Diagnosis not present

## 2023-02-09 DIAGNOSIS — K766 Portal hypertension: Secondary | ICD-10-CM

## 2023-02-09 DIAGNOSIS — K7682 Hepatic encephalopathy: Secondary | ICD-10-CM

## 2023-02-09 LAB — CBC WITH DIFFERENTIAL/PLATELET
Abs Immature Granulocytes: 0.01 10*3/uL (ref 0.00–0.07)
Basophils Absolute: 0 10*3/uL (ref 0.0–0.1)
Basophils Relative: 0 %
Eosinophils Absolute: 0.2 10*3/uL (ref 0.0–0.5)
Eosinophils Relative: 5 %
HCT: 32.8 % — ABNORMAL LOW (ref 36.0–46.0)
Hemoglobin: 10.9 g/dL — ABNORMAL LOW (ref 12.0–15.0)
Immature Granulocytes: 0 %
Lymphocytes Relative: 18 %
Lymphs Abs: 0.8 10*3/uL (ref 0.7–4.0)
MCH: 34.4 pg — ABNORMAL HIGH (ref 26.0–34.0)
MCHC: 33.2 g/dL (ref 30.0–36.0)
MCV: 103.5 fL — ABNORMAL HIGH (ref 80.0–100.0)
Monocytes Absolute: 0.6 10*3/uL (ref 0.1–1.0)
Monocytes Relative: 13 %
Neutro Abs: 2.8 10*3/uL (ref 1.7–7.7)
Neutrophils Relative %: 64 %
Platelets: 104 10*3/uL — ABNORMAL LOW (ref 150–400)
RBC: 3.17 MIL/uL — ABNORMAL LOW (ref 3.87–5.11)
RDW: 18 % — ABNORMAL HIGH (ref 11.5–15.5)
WBC: 4.4 10*3/uL (ref 4.0–10.5)
nRBC: 0 % (ref 0.0–0.2)

## 2023-02-09 LAB — COMPREHENSIVE METABOLIC PANEL
ALT: 63 U/L — ABNORMAL HIGH (ref 0–44)
AST: 91 U/L — ABNORMAL HIGH (ref 15–41)
Albumin: 2.9 g/dL — ABNORMAL LOW (ref 3.5–5.0)
Alkaline Phosphatase: 234 U/L — ABNORMAL HIGH (ref 38–126)
Anion gap: 9 (ref 5–15)
BUN: 18 mg/dL (ref 8–23)
CO2: 25 mmol/L (ref 22–32)
Calcium: 9.1 mg/dL (ref 8.9–10.3)
Chloride: 102 mmol/L (ref 98–111)
Creatinine, Ser: 0.65 mg/dL (ref 0.44–1.00)
GFR, Estimated: >60 mL/min (ref >60)
Glucose, Bld: 72 mg/dL (ref 70–99)
Potassium: 3.6 mmol/L (ref 3.5–5.1)
Sodium: 136 mmol/L (ref 135–145)
Total Bilirubin: 1.9 mg/dL — ABNORMAL HIGH (ref 0.3–1.2)
Total Protein: 7 g/dL (ref 6.5–8.1)

## 2023-02-09 LAB — PROTIME-INR
INR: 1.4 — ABNORMAL HIGH (ref 0.8–1.2)
Prothrombin Time: 16.5 seconds — ABNORMAL HIGH (ref 11.4–15.2)

## 2023-02-09 LAB — LACTIC ACID, PLASMA: Lactic Acid, Venous: 1.1 mmol/L (ref 0.5–1.9)

## 2023-02-09 LAB — URINALYSIS, W/ REFLEX TO CULTURE (INFECTION SUSPECTED)
Bilirubin Urine: NEGATIVE
Glucose, UA: NEGATIVE mg/dL
Hgb urine dipstick: NEGATIVE
Ketones, ur: NEGATIVE mg/dL
Nitrite: NEGATIVE
Protein, ur: NEGATIVE mg/dL
Specific Gravity, Urine: 1.009 (ref 1.005–1.030)
pH: 6 (ref 5.0–8.0)

## 2023-02-09 LAB — AMMONIA: Ammonia: 71 umol/L — ABNORMAL HIGH (ref 9–35)

## 2023-02-09 LAB — LIPASE, BLOOD: Lipase: 47 U/L (ref 11–51)

## 2023-02-09 LAB — APTT: aPTT: 38 seconds — ABNORMAL HIGH (ref 24–36)

## 2023-02-09 MED ORDER — FLEET ENEMA 7-19 GM/118ML RE ENEM
1.0000 | ENEMA | Freq: Once | RECTAL | Status: AC
  Administered 2023-02-09: 1 via RECTAL
  Filled 2023-02-09: qty 1

## 2023-02-09 NOTE — ED Triage Notes (Addendum)
Pt takes Lactulose for liver shunt flow problems. Pt had 2 Lg BM on Friday . PT has not had a BM since Friday. Husband called the PCP who suggested Pt may have a blockage . Husband reports there has not been a change as of yet from possible elevated Ammonia level. PT has bowel sounds and is passing gas.

## 2023-02-09 NOTE — Discharge Instructions (Signed)
You were seen in the emergency department for your constipation and your confusion.  Your ammonia level is improving and you had no signs of stroke or infection or worsening of your liver disease to explain your symptoms.  You did have significant amount of stool in your x-ray and we gave you an enema and you were able to have a bowel movement.  You should continue to take your higher dose of lactulose as prescribed until you are having daily soft bowel movements.  You should follow-up with your primary doctor or your liver doctor in the next few days to have your symptoms rechecked.  You should return to the emergency department for worsening confusion, severe abdominal pain, repetitive vomiting or any other new or concerning symptoms.

## 2023-02-09 NOTE — ED Triage Notes (Signed)
Sent by UC d/t AMS and lethargic x6 days. Also c/o no BM since Friday.  Hx liver cirrhosis and on lactulose daily and lactulose was increased on Friday.  Pt reports passing gas.

## 2023-02-09 NOTE — ED Notes (Signed)
X-ray at bedside

## 2023-02-09 NOTE — Discharge Instructions (Signed)
Please go to the emergency department as soon as you leave urgent care for further evaluation and management. ?

## 2023-02-09 NOTE — ED Provider Notes (Signed)
[name]    CSN: [id number] Arrival date & time: 02/09/23  0845      History   Chief Complaint Chief Complaint  Patient presents with   Constipation    HPI [name] is a 72 y.o. female.   Patient presents today with her husband who helps provide majority of history given patient's recent altered mental status.  Patient's husband reports that she has a history of liver cirrhosis and intermittent hepatic encephalopathy.  He reports that mental status began to change approximately 6 days ago.  He states that she kept asking about "the stuff" and was not able to tell him what she was talking about.  He also reports that she has been increasingly drowsy over the past few days.  Recently had a fall and broke her left wrist and is being followed by Ortho.  No falls since then per her husband report.  Husband reports that she was not having any bowel movements on Friday and typically has approximately 1 bowel movement a day with her daily lactulose.  They called her liver doctor who advised to increase dose to 60 mL every 2 hours to produce 2-3 bowel movements a day.  Although, husband reports that he was giving her 50 mL every 2 hours.  She produced 2 very large bowel movements that same day but mental status did not improve.  Husband reports that she has not had any bowel movements since that day.  He has been giving her 50 to 60 mL of lactulose every 2 hours over the past few days with no bowel movement.  Patient reports that she is still passing flatulence and denies any abdominal pain.  Patient denies headache, dizziness, blurred vision, nausea, vomiting.   Constipation   Past Medical History:  Diagnosis Date   Acute urinary retention 05/04/2022   Allergy 2006 ?   Contrast dye   Arthritis 2016 ??   Knees and thumb   Cancer ([place])    cecum   Cataract 2021   Surgery scheduled July 2023   Colon cancer [facility]) 2003   Elevated liver function tests    Esophageal  varices (HCC)    Heart murmur On file   Hemorrhage of gastrointestinal tract 05/04/2011   Hypertension 2021   Hypothyroidism    Iron deficiency anemia    Liver disease    chemotherapy complication, per pt, shunts placed to bypass liver   Malignant neoplasm of cecum ([place])    Portal hypertension ([place])    Skin cancer 2019   Splenomegaly     Patient Active Problem List   Diagnosis Date Noted   Fatigue 01/04/2023   Confusion 08/26/2022   Cystitis 08/26/2022   Frequent UTI 05/18/2022   Anemia 05/18/2022   Bacteremia 05/04/2022   Hypernatremia 05/04/2022   Sinus bradycardia 02/05/2022   Hepatic encephalopathy ([place]) 02/03/2022   Basal cell carcinoma (BCC) 05/06/2021   Cecal cancer ([place]) 05/06/2021   Hypertension 05/06/2021   COVID-19 vaccination declined 01/30/2021   Decompensated liver cirrhosis with portal HTN and gastric varices 12/04/2020   Essential hypertension 08/12/2020   Murmur, cardiac 03/19/2020   Chronic pain of both knees 01/29/2020   Hypothyroidism 06/30/2019   History of basal cell cancer 06/30/2019   Hx of colon cancer, stage III 11/30/2011   Esophageal varices ([place]) 06/12/2010   Portal hypertension ([place]) 01/23/2008    Past Surgical History:  Procedure Laterality Date   COLON SURGERY  2004   Cancer   COSMETIC SURGERY  2021   Skin cancer   ESOPHAGEAL VARICE LIGATION     EYE SURGERY     HEMICOLECTOMY  01/08/2003   IR RADIOLOGIST EVAL & MGMT  12/20/2020   IR RADIOLOGIST EVAL & MGMT  05/29/2021   LIVER SURGERY     shunts placed after chemo complication   SKIN FULL THICKNESS GRAFT N/A 09/12/2019   Procedure: debridement and FTSG to the nose from left upper arm;  Surgeon: Cindra Presume, MD;  Location: [place];  Service: Plastics;  Laterality: N/A;  2 hours, please   TIPS PROCEDURE      OB History   No obstetric history on file.      Home Medications    Prior to Admission medications   Medication Sig Start Date End Date Taking?  Authorizing Provider  acidophilus (RISAQUAD) CAPS capsule Take 1 capsule by mouth daily.    [provider]  albuterol (VENTOLIN HFA) 108 (90 Base) MCG/ACT inhaler Inhale 1-2 puffs into the lungs every 6 (six) hours as needed for wheezing or shortness of breath. 11/14/22   Francene Finders, PA-C  Ascorbic Acid (VITAMIN C PO) Take 2,000 mg by mouth daily.    [provider]  AYR SALINE NASAL NA Place 1 spray into the nose daily as needed (congestion).    [provider]  Cholecalciferol (VITAMIN D3) 50 MCG (2000 UT) TABS Take 2,000 Units by mouth at bedtime.     [provider]  Cranberry 50 MG CHEW Chew 50 mg by mouth 2 (two) times daily.    [provider]  doxycycline (VIBRAMYCIN) 50 MG capsule Take 50 mg by mouth daily. 12/17/22   [provider]  ELDERBERRY PO Take 15-30 mLs by mouth See admin instructions. Take 15 ml in the morning and 30 ml at night    [provider]  estradiol (ESTRACE) 0.1 MG/GM vaginal cream Place vaginally every other day. 11/09/22   [provider]  furosemide (LASIX) 20 MG tablet Take 2 tablets (40 mg total) by mouth daily. Hold until you see your liver doctor 05/05/22 05/05/23  Lorella Nimrod, MD  lactulose (CHRONULAC) 10 GM/15ML solution Take 30 g by mouth 3 (three) times daily. 08/13/22   [provider]  levOCARNitine (CARNITOR) 330 MG tablet Take 330 mg by mouth 3 (three) times daily. 05/23/22   [provider]  Multiple Vitamin (MULTI-VITAMIN) tablet Take 1 tablet by mouth daily.    [provider]  nitrofurantoin, macrocrystal-monohydrate, (MACROBID) 100 MG capsule Take 1 capsule (100 mg total) by mouth 2 (two) times daily. 11/05/22   Tonia Ghent, MD  spironolactone (ALDACTONE) 50 MG tablet Take 0.5 tablets (25 mg total) by mouth daily. 11/05/22 11/05/23  Tonia Ghent, MD  SYNTHROID 75 MCG tablet Take 1 tablet (75 mcg total) by mouth daily before breakfast. Must be  seen in office for more refills. 12/03/22   Dutch Quint B, FNP  zinc gluconate 50 MG tablet Take 50 mg by mouth 3 (three) times daily.    [provider]    Family History Family History  Problem Relation Age of Onset   Arthritis Mother    Hearing loss Mother    Heart disease Mother    Hypertension Mother    Miscarriages / [place] Mother    Arthritis Father    Diabetes Father    Heart disease Father    Cancer Maternal Aunt    Breast cancer Neg Hx    Colon  cancer Neg Hx    Esophageal cancer Neg Hx    Pancreatic cancer Neg Hx    Stomach cancer Neg Hx     Social History Social History   Tobacco Use   Smoking status: Never   Smokeless tobacco: Never   Tobacco comments:    Never smoked  Vaping Use   Vaping Use: Never used  Substance Use Topics   Alcohol use: Never   Drug use: Never     Allergies   Contrast media [iodinated contrast media]   Review of Systems Review of Systems Per HPI  Physical Exam Triage Vital Signs ED Triage Vitals  Enc Vitals Group     BP 02/09/23 0919 105/65     Pulse Rate 02/09/23 0919 (!) 59     Resp 02/09/23 0919 16     Temp 02/09/23 0925 97.6 F (36.4 C)     Temp Source 02/09/23 0925 Temporal     SpO2 02/09/23 0919 95 %     Weight --      Height --      Head Circumference --      Peak Flow --      Pain Score 02/09/23 0917 0     Pain Loc --      Pain Edu? --      Excl. in [place]? --    No data found.  Updated Vital Signs BP 105/65   Pulse (!) 59   Temp 97.6 F (36.4 C) (Temporal)   Resp 16   SpO2 95%   Visual Acuity Right Eye Distance:   Left Eye Distance:   Bilateral Distance:    Right Eye Near:   Left Eye Near:    Bilateral Near:     Physical Exam Constitutional:      General: She is not in acute distress.    Appearance: Normal appearance. She is not toxic-appearing or diaphoretic.  HENT:     Head: Normocephalic and atraumatic.  Eyes:     Extraocular Movements: Extraocular movements intact.      Conjunctiva/sclera: Conjunctivae normal.     Pupils: Pupils are equal, round, and reactive to light.  Cardiovascular:     Rate and Rhythm: Normal rate and regular rhythm.     Pulses: Normal pulses.     Heart sounds: Normal heart sounds.  Pulmonary:     Effort: Pulmonary effort is normal. No respiratory distress.     Breath sounds: Normal breath sounds.  Abdominal:     General: Bowel sounds are normal. There is no distension.     Palpations: Abdomen is soft.     Tenderness: There is no abdominal tenderness.  Neurological:     General: No focal deficit present.     Mental Status: She is alert and oriented to person, place, and time. Mental status is at baseline.     Cranial Nerves: Cranial nerves 2-12 are intact.     Sensory: Sensation is intact.     Motor: Motor function is intact.     Coordination: Coordination is intact.     Gait: Gait is intact.     Comments: Patient has cast on left arm.  Unable to adequately assess this motor function and coordination. Otherwise neuroexam appears normal.  Psychiatric:        Mood and Affect: Mood normal.        Behavior: Behavior normal.        Thought Content: Thought content normal.        Judgment:  Judgment normal.      UC Treatments / Results  Labs (all labs ordered are listed, but only abnormal results are displayed) Labs Reviewed - No data to display  EKG   Radiology No results found.  Procedures Procedures (including critical care time)  Medications Ordered in UC Medications - No data to display  Initial Impression / Assessment and Plan / UC Course  I have reviewed the triage vital signs and the nursing notes.  Pertinent labs & imaging results that were available during my care of the patient were reviewed by me and considered in my medical decision making (see chart for details).     Patient's recent PCP note on 3/14 recommended CT of the head if mental status did not improve.  I do think this is reasonable which  cannot be provided here in urgent care.  I am also concerned for bowel obstruction and worsening hepatic encephalopathy which requires more extensive evaluation and management than can be provided at urgent care.  Therefore, husband and patient were advised to go to the ER for further evaluation and management.  They were agreeable with plan.  Vital signs and neuroexam stable at discharge.  Agree with patient's husband transporting her to the ER. Final Clinical Impressions(s) / UC Diagnoses   Final diagnoses:  Altered mental status, unspecified altered mental status type  Constipation, unspecified constipation type     Discharge Instructions      Please go to the emergency department as soon as you leave urgent care for further evaluation and management.    ED Prescriptions   None    PDMP not reviewed this encounter.   Teodora Medici, [place] 02/09/23 [phone]

## 2023-02-09 NOTE — ED Provider Notes (Signed)
[facility] Provider Note   CSN: [id number] Arrival date & time: 02/09/23  1024     History  Chief Complaint  Patient presents with   Constipation   Altered Mental Status    [name] is a 72 y.o. female.  Patient is a 72 year old female with a past medical history of cirrhosis, hypothyroidism, prior colon cancer status post partial colectomy presenting to the emergency department with altered mental status and constipation.  Patient is here with her husband who reports that she has had chronic constipation all her life.  He states that she broke her left wrist about a week ago and was on narcotics for her fracture though has not had any for the past week.  He states that she did have increased constipation last week and was starting to become confused and appeared encephalopathic last Friday.  He states that they spoke with her liver doctor who recommended increasing her lactulose and she did have 2 large bowel movements after that with improvement of her mental status.  He states however she has not had a bowel movement since then.  They have tried increasing her lactulose again as well as giving her suppositories and she has continued to pass gas but has not had any bowel movements.  The patient reported 1 episode of nausea and vomiting over the weekend.  She denies any abdominal pain but does report fullness.  Her husband reports that she does have frequent UTIs but denies any dysuria or hematuria.  He states that her mental status has been fairly stable since Saturday.  Denies any fevers or chills but her husband reports that she does tend to get hypothermic with her encephalopathy.  The history is provided by the patient and the spouse.  Constipation Altered Mental Status      Home Medications Prior to Admission medications   Medication Sig Start Date End Date Taking? Authorizing Provider  acidophilus (RISAQUAD) CAPS capsule Take  1 capsule by mouth daily.    [provider]  albuterol (VENTOLIN HFA) 108 (90 Base) MCG/ACT inhaler Inhale 1-2 puffs into the lungs every 6 (six) hours as needed for wheezing or shortness of breath. 11/14/22   Francene Finders, PA-C  Ascorbic Acid (VITAMIN C PO) Take 2,000 mg by mouth daily.    [provider]  AYR SALINE NASAL NA Place 1 spray into the nose daily as needed (congestion).    [provider]  Cholecalciferol (VITAMIN D3) 50 MCG (2000 UT) TABS Take 2,000 Units by mouth at bedtime.     [provider]  Cranberry 50 MG CHEW Chew 50 mg by mouth 2 (two) times daily.    [provider]  doxycycline (VIBRAMYCIN) 50 MG capsule Take 50 mg by mouth daily. 12/17/22   [provider]  ELDERBERRY PO Take 15-30 mLs by mouth See admin instructions. Take 15 ml in the morning and 30 ml at night    [provider]  estradiol (ESTRACE) 0.1 MG/GM vaginal cream Place vaginally every other day. 11/09/22   [provider]  furosemide (LASIX) 20 MG tablet Take 2 tablets (40 mg total) by mouth daily. Hold until you see your liver doctor 05/05/22 05/05/23  Lorella Nimrod, MD  lactulose (CHRONULAC) 10 GM/15ML solution Take 30 g by mouth 3 (three) times daily. 08/13/22   [provider]  levOCARNitine (CARNITOR) 330 MG tablet Take 330 mg by mouth 3 (three) times daily. 05/23/22  [provider]  Multiple Vitamin (MULTI-VITAMIN) tablet Take 1 tablet by mouth daily.    [provider]  nitrofurantoin, macrocrystal-monohydrate, (MACROBID) 100 MG capsule Take 1 capsule (100 mg total) by mouth 2 (two) times daily. 11/05/22   Tonia Ghent, MD  spironolactone (ALDACTONE) 50 MG tablet Take 0.5 tablets (25 mg total) by mouth daily. 11/05/22 11/05/23  Tonia Ghent, MD  SYNTHROID 75 MCG tablet Take 1 tablet (75 mcg total) by mouth daily before breakfast. Must be seen in office for more refills. 12/03/22   Dutch Quint B, FNP   zinc gluconate 50 MG tablet Take 50 mg by mouth 3 (three) times daily.    [provider]      Allergies    Contrast media [iodinated contrast media]    Review of Systems   Review of Systems  Gastrointestinal:  Positive for constipation.    Physical Exam Updated Vital Signs BP (!) 107/58   Pulse 70   Temp (!) 96.4 F (35.8 C) (Rectal)   Resp 16   SpO2 93%  Physical Exam Vitals and nursing note reviewed.  Constitutional:      General: She is not in acute distress.    Appearance: Normal appearance.  HENT:     Head: Normocephalic and atraumatic.     Nose: Nose normal.     Mouth/Throat:     Mouth: Mucous membranes are moist.     Pharynx: Oropharynx is clear.  Eyes:     General: No scleral icterus.    Extraocular Movements: Extraocular movements intact.     Conjunctiva/sclera: Conjunctivae normal.  Cardiovascular:     Rate and Rhythm: Normal rate and regular rhythm.     Heart sounds: Normal heart sounds.  Pulmonary:     Effort: Pulmonary effort is normal.     Breath sounds: Normal breath sounds.  Abdominal:     General: Abdomen is flat.     Palpations: Abdomen is soft.     Tenderness: There is no abdominal tenderness.     Comments: No palpable fluid wave  Musculoskeletal:        General: Normal range of motion.     Cervical back: Normal range of motion.  Skin:    General: Skin is warm and dry.  Neurological:     General: No focal deficit present.     Mental Status: She is alert and oriented to person, place, and time.  Psychiatric:        Mood and Affect: Mood normal.        Behavior: Behavior normal.     ED Results / Procedures / Treatments   Labs (all labs ordered are listed, but only abnormal results are displayed) Labs Reviewed  COMPREHENSIVE METABOLIC PANEL - Abnormal; Notable for the following components:      Result Value   Albumin 2.9 (*)    AST 91 (*)    ALT 63 (*)    Alkaline Phosphatase 234 (*)    Total Bilirubin 1.9 (*)    All  other components within normal limits  CBC WITH DIFFERENTIAL/PLATELET - Abnormal; Notable for the following components:   RBC 3.17 (*)    Hemoglobin 10.9 (*)    HCT 32.8 (*)    MCV 103.5 (*)    MCH 34.4 (*)    RDW 18.0 (*)    Platelets 104 (*)    All other components within normal limits  PROTIME-INR - Abnormal; Notable for the following components:   Prothrombin  Time 16.5 (*)    INR 1.4 (*)    All other components within normal limits  APTT - Abnormal; Notable for the following components:   aPTT 38 (*)    All other components within normal limits  URINALYSIS, W/ REFLEX TO CULTURE (INFECTION SUSPECTED) - Abnormal; Notable for the following components:   Leukocytes,Ua TRACE (*)    Bacteria, UA RARE (*)    All other components within normal limits  AMMONIA - Abnormal; Notable for the following components:   Ammonia 71 (*)    All other components within normal limits  CULTURE, BLOOD (ROUTINE X 2)  CULTURE, BLOOD (ROUTINE X 2)  LACTIC ACID, PLASMA  LIPASE, BLOOD    EKG EKG Interpretation  Date/Time:  Tuesday February 09 2023 10:57:49 EDT Ventricular Rate:  57 PR Interval:  196 QRS Duration: 108 QT Interval:  445 QTC Calculation: 434 R Axis:   41 Text Interpretation: Sinus rhythm Baseline wander in lead(s) V4 No significant change since last tracing Confirmed by Leanord Asal ([id number]) on 02/09/2023 11:31:47 AM  Radiology CT Head Wo Contrast  Result Date: 02/09/2023 CLINICAL DATA:  Provided history: Mental status change, unknown cause. EXAM: CT HEAD WITHOUT CONTRAST TECHNIQUE: Contiguous axial images were obtained from the base of the skull through the vertex without intravenous contrast. RADIATION DOSE REDUCTION: This exam was performed according to the departmental dose-optimization program which includes automated exposure control, adjustment of the mA and/or kV according to patient size and/or use of iterative reconstruction technique. COMPARISON:  Brain MRI 04/30/2022. Head  CT 04/30/2022. FINDINGS: Brain: Cerebral volume is normal. Patchy and ill-defined hypoattenuation within the cerebral white matter, nonspecific but compatible with mild chronic small vessel ischemic disease. Partially empty sella turcica. There is no acute intracranial hemorrhage. No demarcated cortical infarct. No extra-axial fluid collection. No evidence of an intracranial mass. No midline shift. Vascular: No hyperdense vessel.  Atherosclerotic calcifications. Skull: No fracture or aggressive osseous lesion. Sinuses/Orbits: No mass or acute finding within the imaged orbits. No significant paranasal sinus disease at the imaged levels. IMPRESSION: 1. No evidence of an acute intracranial abnormality. 2. Mild chronic small vessel ischemic changes within the cerebral white matter. Electronically Signed   By: Kellie Simmering D.O.   On: 02/09/2023 11:54   DG Abd Portable 1 View  Result Date: 02/09/2023 CLINICAL DATA:  Questionable sepsis EXAM: PORTABLE ABDOMEN - 1 VIEW COMPARISON:  Portable exam 1047 hours compared to 05/02/2022 FINDINGS: Stool throughout colon to rectum. Tips shunt RIGHT upper quadrant. No bowel dilatation or bowel wall thickening. Nonobstructive bowel gas pattern. Multiple gallstones. Osseous demineralization. IMPRESSION: Nonobstructive bowel gas pattern. Cholelithiasis. Electronically Signed   By: Lavonia Dana M.D.   On: 02/09/2023 11:37   DG Chest Port 1 View  Result Date: 02/09/2023 CLINICAL DATA:  Questionable sepsis EXAM: PORTABLE CHEST 1 VIEW COMPARISON:  04/30/2022 FINDINGS: Borderline cardiopericardial silhouette. Vascular congestion. No consolidation, pneumothorax or effusion. Overlapping cardiac leads. Minimal right basilar atelectasis. Note is made of a tips shunt in the upper abdomen on the right side. Degenerative changes of the shoulders and spine. IMPRESSION: Borderline cardiac size with slight central vascular congestion Electronically Signed   By: Jill Side M.D.   On: 02/09/2023  11:31    Procedures Procedures    Medications Ordered in ED Medications  sodium phosphate (FLEET) 7-19 GM/118ML enema 1 enema (1 enema Rectal Given 02/09/23 1427)    ED Course/ Medical Decision Making/ A&P Clinical Course as of 02/09/23 1529  Tue Feb 09, 2023  1200 Labs at patient's baseline with mildly elevated ammonia, though downtrending from outpatient labs 5 days ago. UA is pending. Abd XR with non-obstructive bowel gas pattern with significant stool burden. Patient will be offered enema here. [VK]  1507 UA negative, temp is improving. [VK]  1527 Patient was able to have a bowel movement.  She is stable for discharge home with outpatient primary care and hepatology follow-up and was given strict return precautions. [VK]    Clinical Course User Index [VK] Kemper Durie, DO                             Medical Decision Making This patient presents to the ED with chief complaint(s) of AMS, constipation with pertinent past medical history of cirrhosis, hypothyroidism, prior colon cancer s/p partial colectomy which further complicates the presenting complaint. The complaint involves an extensive differential diagnosis and also carries with it a high risk of complications and morbidity.    The differential diagnosis includes hepatic encephalopathy, infection, sepsis, ICH or mass effect, she has no focal neurologic deficits making CVA less likely, electrolyte abnormality, constipation, SBO  Additional history obtained: Additional history obtained from spouse Records reviewed outpatient hepatology records and urgent care records  ED Course and Reassessment: On patient's arrival to the emergency department she is awake alert well-appearing in no acute distress.  She was found to be hypothermic with a rectal temperature of 34 C.  She was placed on a [employer].  The patient will have workup performed to evaluate for causes of her encephalopathy including infection or hepatic  encephalopathy and will additionally have a head CT performed.  Abdominal x-ray will be performed to evaluate for possible SBO versus constipation and she will be closely reassessed.  Independent labs interpretation:  The following labs were independently interpreted: at baseline, downtrending ammonia, no acute abnormalities  Independent visualization of imaging: - I independently visualized the following imaging with scope of interpretation limited to determining acute life threatening conditions related to emergency care: CXR, Abd XR, which revealed significant stool burden otherwise no acute disease  Consultation: - Consulted or discussed management/test interpretation w/ external professional: N/A  Consideration for admission or further workup: Patient has no emergent conditions requiring admission or further work-up at this time and is stable for discharge home with primary care follow-up  Social Determinants of health: N/A    Amount and/or Complexity of Data Reviewed Labs: ordered. Radiology: ordered. ECG/medicine tests: ordered.  Risk OTC drugs.          Final Clinical Impression(s) / ED Diagnoses Final diagnoses:  Constipation, unspecified constipation type  Confusion    Rx / DC Orders ED Discharge Orders     None         Kemper Durie, DO 02/09/23 1529

## 2023-02-09 NOTE — ED Notes (Signed)
Patient is being discharged from the Urgent Care and sent to the Emergency Department via POV . Per HM, patient is in need of higher level of care due to abd pain. Patient is aware and verbalizes understanding of plan of care.  Vitals:   02/09/23 0919 02/09/23 0925  BP: 105/65   Pulse: (!) 59   Resp: 16   Temp:  97.6 F (36.4 C)  SpO2: 95%

## 2023-02-10 ENCOUNTER — Other Ambulatory Visit: Payer: Self-pay | Admitting: Nurse Practitioner

## 2023-02-10 DIAGNOSIS — Z95828 Presence of other vascular implants and grafts: Secondary | ICD-10-CM

## 2023-02-14 LAB — CULTURE, BLOOD (ROUTINE X 2)
Culture: NO GROWTH
Culture: NO GROWTH
Special Requests: ADEQUATE
Special Requests: ADEQUATE

## 2023-02-16 ENCOUNTER — Telehealth: Payer: Self-pay

## 2023-02-16 NOTE — Telephone Encounter (Signed)
        Patient  visited [facility] on 3/18     Telephone encounter attempt :  1st  A HIPAA compliant voice message was left requesting a return call.  Instructed patient to call back    [facility] [phone] [address], [place], [place] [address] Phone: [phone] Email: [email or web address]

## 2023-02-17 ENCOUNTER — Telehealth: Payer: Self-pay

## 2023-02-17 NOTE — Telephone Encounter (Signed)
        Patient  visited [place] on 3/19    Telephone encounter attempt :  2nd  A HIPAA compliant voice message was left requesting a return call.  Instructed patient to call back .    Central [phone] [address], [place], [place] [address] Phone: [phone] Email: [email or web address]

## 2023-02-18 ENCOUNTER — Telehealth: Payer: Self-pay | Admitting: Gastroenterology

## 2023-02-18 NOTE — Telephone Encounter (Signed)
Inbound call from patient spouse requesting a call back from a nurse due to patient experiencing abdomina pain. Would like to speak with a nurse prior to scheduling an appointment.

## 2023-02-22 NOTE — Telephone Encounter (Signed)
Left message on machine to call back  

## 2023-02-22 NOTE — Telephone Encounter (Signed)
Inbound call from patient spouse to follow up with note below

## 2023-02-23 DIAGNOSIS — K7682 Hepatic encephalopathy: Secondary | ICD-10-CM | POA: Diagnosis not present

## 2023-02-23 DIAGNOSIS — K766 Portal hypertension: Secondary | ICD-10-CM | POA: Diagnosis not present

## 2023-02-23 DIAGNOSIS — I10 Essential (primary) hypertension: Secondary | ICD-10-CM | POA: Diagnosis not present

## 2023-02-23 NOTE — Telephone Encounter (Signed)
Left message on machine to call back  

## 2023-02-24 ENCOUNTER — Ambulatory Visit (INDEPENDENT_AMBULATORY_CARE_PROVIDER_SITE_OTHER)
Admission: RE | Admit: 2023-02-24 | Discharge: 2023-02-24 | Disposition: A | Discharge status: Home / Self Care | Payer: Medicare Other | Source: Ambulatory Visit | Attending: Nurse Practitioner | Admitting: Nurse Practitioner

## 2023-02-24 ENCOUNTER — Encounter: Payer: Self-pay | Admitting: Nurse Practitioner

## 2023-02-24 ENCOUNTER — Ambulatory Visit (INDEPENDENT_AMBULATORY_CARE_PROVIDER_SITE_OTHER): Payer: Medicare Other | Admitting: Nurse Practitioner

## 2023-02-24 VITALS — BP 110/62 | HR 62 | Temp 97.6°F | Resp 16 | Ht 62.75 in | Wt 136.5 lb

## 2023-02-24 DIAGNOSIS — K5909 Other constipation: Secondary | ICD-10-CM | POA: Insufficient documentation

## 2023-02-24 DIAGNOSIS — R194 Change in bowel habit: Secondary | ICD-10-CM | POA: Diagnosis not present

## 2023-02-24 DIAGNOSIS — R1084 Generalized abdominal pain: Secondary | ICD-10-CM | POA: Diagnosis not present

## 2023-02-24 DIAGNOSIS — Z09 Encounter for follow-up examination after completed treatment for conditions other than malignant neoplasm: Secondary | ICD-10-CM

## 2023-02-24 DIAGNOSIS — R634 Abnormal weight loss: Secondary | ICD-10-CM | POA: Diagnosis not present

## 2023-02-24 DIAGNOSIS — K8020 Calculus of gallbladder without cholecystitis without obstruction: Secondary | ICD-10-CM | POA: Diagnosis not present

## 2023-02-24 NOTE — Assessment & Plan Note (Signed)
Interview most recent ED visit note, labs, imaging.

## 2023-02-24 NOTE — Telephone Encounter (Signed)
I was able to speak with the pt and her husband at length.  The pt has a history of cirrhosis with last follow up in March 2023.  She is a patient of Dr Tarri Glenn.  She recently broke her wrist and was given narcotic and became constipated.  She then became encephalopathic and was taken to the ED.  She was treated and began to add miralax to her lactulose.  She improved and returned to her baseline.  Now she is being seen by her PCP for an xray of the abd to confirm she has no blockage (per husband) She has mushy stools now and PCP wants to confirm nothing seen on xray (completed today) I have advised that she should make a follow up.  She has been scheduled to see Dr Henrene Pastor since Dr Tarri Glenn is leaving.  She/husband will call back if needed prior to that appt.

## 2023-02-24 NOTE — Assessment & Plan Note (Signed)
Patient is also considerable amount of weight over the past month.  Per patient's spouse reports she is eating more to her normal.  Will have a close follow-up if she is continuing to lose weight and bowel movements are normal we will need to consider CT of chest abdomen pelvis with contrast

## 2023-02-24 NOTE — Progress Notes (Signed)
Acute Office Visit  Subjective:     Patient ID: [name], female    DOB: 1951-05-28, 72 y.o.   MRN: [id number]  Chief Complaint  Patient presents with   Weight Loss   Constipation    Went from no be able to go to mush.     Patient is in today for multiple complaints with a history of Sinus bradycardia, portal hypertension, HTN, Liver cirrhosis, hepatic encephalopathy  Weight loss: states that over the past 2 days she has been eating more to her normal. States that she was not eating her normal for approx 03/17 or 03/18  Bowel changes: patient was seen in the UC and then ED on 02/09/2023 with complaints of confusion and constipation. She underwent a work up and enema. She was found to have a large stool burden. She also takes lactulose at home for her liver failure. States that when they got home she was not moving her bowerls. States they talked to the liver clinic and added miralax to her lactulose. They have been back and forth with starting and stopping the miralax. she is having some leakage of the stool and not sure if it is going to be gas or a BM. She is not having any rectal pain or pressure. She is having some abdominal pain   Review of Systems  Constitutional:  Positive for malaise/fatigue and weight loss. Negative for chills and fever.  Gastrointestinal:  Positive for abdominal pain, constipation and diarrhea. Negative for nausea and vomiting.        Objective:    BP 110/62   Pulse 62   Temp 97.6 F (36.4 C)   Resp 16   Ht 5' 2.75" (1.594 m)   Wt 136 lb 8 oz (61.9 kg)   SpO2 95%   BMI 24.37 kg/m  BP Readings from Last 3 Encounters:  02/24/23 110/62  02/09/23 (!) 107/58  02/09/23 105/65   Wt Readings from Last 3 Encounters:  02/24/23 136 lb 8 oz (61.9 kg)  02/04/23 157 lb (71.2 kg)  01/04/23 154 lb 8 oz (70.1 kg)      Physical Exam Vitals and nursing note reviewed. Exam conducted with a chaperone present Sherrilee Gilles, CMA).  Constitutional:       Appearance: Normal appearance.  Cardiovascular:     Rate and Rhythm: Normal rate and regular rhythm.     Heart sounds: Normal heart sounds.  Pulmonary:     Effort: Pulmonary effort is normal.     Breath sounds: Normal breath sounds.  Abdominal:     General: Bowel sounds are normal. There is distension.     Palpations: There is no mass.     Tenderness: There is abdominal tenderness.     Hernia: No hernia is present.  Genitourinary:    Rectum: Normal.  Neurological:     Mental Status: She is alert.     No results found for any visits on 02/24/23.      Assessment & Plan:   Problem List Items Addressed This Visit       Other   Weight loss    Patient is also considerable amount of weight over the past month.  Per patient's spouse reports she is eating more to her normal.  Will have a close follow-up if she is continuing to lose weight and bowel movements are normal we will need to consider CT of chest abdomen pelvis with contrast      Bowel habit changes  Patient went from not being able to have any bowel movements to having soft baby food style bowel movements.  She is having some fecal leakage albeit rectal tone within normal limits.      Relevant Orders   DG Abd 2 Views   Generalized abdominal pain - Primary    Patient is experiencing abdominal discomfort.  Rectal exam was clear of any impaction Digitally.  Will obtain a two-view abdomen to see the stool burden.  It looks okay patient continues having abdominal pain need to get a CT scan      Relevant Orders   DG Abd [facility] discharge follow-up    Interview most recent ED visit note, labs, imaging.       No orders of the defined types were placed in this encounter.   Return in about 2 weeks (around 03/10/2023) for weight recheck/Bm check .  Romilda Garret, NP

## 2023-02-24 NOTE — Patient Instructions (Signed)
Nice to see you today I want to see you in 2 weeks for a weight recheck. I will be in touch with the xray once I have the results

## 2023-02-24 NOTE — Assessment & Plan Note (Signed)
Patient went from not being able to have any bowel movements to having soft baby food style bowel movements.  She is having some fecal leakage albeit rectal tone within normal limits.

## 2023-02-24 NOTE — Assessment & Plan Note (Signed)
Patient is experiencing abdominal discomfort.  Rectal exam was clear of any impaction Digitally.  Will obtain a two-view abdomen to see the stool burden.  It looks okay patient continues having abdominal pain need to get a CT scan

## 2023-02-25 ENCOUNTER — Telehealth: Payer: Self-pay | Admitting: Nurse Practitioner

## 2023-02-25 ENCOUNTER — Inpatient Hospital Stay (HOSPITAL_COMMUNITY)
Admission: EM | Admit: 2023-02-25 | Discharge: 2023-03-13 | DRG: 441 | Disposition: A | Discharge status: Rehabilitation Facility | Payer: Medicare Other | Attending: Internal Medicine | Admitting: Internal Medicine

## 2023-02-25 ENCOUNTER — Encounter (HOSPITAL_COMMUNITY): Payer: Self-pay | Admitting: Pharmacy Technician

## 2023-02-25 DIAGNOSIS — R4182 Altered mental status, unspecified: Secondary | ICD-10-CM | POA: Diagnosis not present

## 2023-02-25 DIAGNOSIS — I495 Sick sinus syndrome: Secondary | ICD-10-CM | POA: Diagnosis present

## 2023-02-25 DIAGNOSIS — Z85038 Personal history of other malignant neoplasm of large intestine: Secondary | ICD-10-CM | POA: Diagnosis not present

## 2023-02-25 DIAGNOSIS — R278 Other lack of coordination: Secondary | ICD-10-CM | POA: Diagnosis present

## 2023-02-25 DIAGNOSIS — K8020 Calculus of gallbladder without cholecystitis without obstruction: Secondary | ICD-10-CM | POA: Diagnosis present

## 2023-02-25 DIAGNOSIS — K7682 Hepatic encephalopathy: Principal | ICD-10-CM | POA: Diagnosis present

## 2023-02-25 DIAGNOSIS — Z822 Family history of deafness and hearing loss: Secondary | ICD-10-CM

## 2023-02-25 DIAGNOSIS — T40605A Adverse effect of unspecified narcotics, initial encounter: Secondary | ICD-10-CM | POA: Diagnosis present

## 2023-02-25 DIAGNOSIS — R64 Cachexia: Secondary | ICD-10-CM | POA: Diagnosis present

## 2023-02-25 DIAGNOSIS — K7469 Other cirrhosis of liver: Secondary | ICD-10-CM | POA: Diagnosis not present

## 2023-02-25 DIAGNOSIS — D649 Anemia, unspecified: Secondary | ICD-10-CM | POA: Diagnosis not present

## 2023-02-25 DIAGNOSIS — R54 Age-related physical debility: Secondary | ICD-10-CM | POA: Diagnosis present

## 2023-02-25 DIAGNOSIS — R1310 Dysphagia, unspecified: Secondary | ICD-10-CM | POA: Diagnosis present

## 2023-02-25 DIAGNOSIS — K7460 Unspecified cirrhosis of liver: Secondary | ICD-10-CM | POA: Diagnosis present

## 2023-02-25 DIAGNOSIS — I959 Hypotension, unspecified: Secondary | ICD-10-CM | POA: Diagnosis present

## 2023-02-25 DIAGNOSIS — E8809 Other disorders of plasma-protein metabolism, not elsewhere classified: Secondary | ICD-10-CM | POA: Diagnosis not present

## 2023-02-25 DIAGNOSIS — E876 Hypokalemia: Secondary | ICD-10-CM | POA: Diagnosis present

## 2023-02-25 DIAGNOSIS — Z8679 Personal history of other diseases of the circulatory system: Secondary | ICD-10-CM | POA: Diagnosis present

## 2023-02-25 DIAGNOSIS — S62102D Fracture of unspecified carpal bone, left wrist, subsequent encounter for fracture with routine healing: Secondary | ICD-10-CM

## 2023-02-25 DIAGNOSIS — E039 Hypothyroidism, unspecified: Secondary | ICD-10-CM | POA: Diagnosis not present

## 2023-02-25 DIAGNOSIS — E2740 Unspecified adrenocortical insufficiency: Secondary | ICD-10-CM | POA: Diagnosis not present

## 2023-02-25 DIAGNOSIS — Z79890 Hormone replacement therapy (postmenopausal): Secondary | ICD-10-CM

## 2023-02-25 DIAGNOSIS — E43 Unspecified severe protein-calorie malnutrition: Secondary | ICD-10-CM | POA: Insufficient documentation

## 2023-02-25 DIAGNOSIS — Z8616 Personal history of COVID-19: Secondary | ICD-10-CM

## 2023-02-25 DIAGNOSIS — D61818 Other pancytopenia: Secondary | ICD-10-CM | POA: Diagnosis present

## 2023-02-25 DIAGNOSIS — E162 Hypoglycemia, unspecified: Secondary | ICD-10-CM | POA: Diagnosis not present

## 2023-02-25 DIAGNOSIS — Z85828 Personal history of other malignant neoplasm of skin: Secondary | ICD-10-CM

## 2023-02-25 DIAGNOSIS — Z79899 Other long term (current) drug therapy: Secondary | ICD-10-CM | POA: Diagnosis not present

## 2023-02-25 DIAGNOSIS — D689 Coagulation defect, unspecified: Secondary | ICD-10-CM | POA: Diagnosis not present

## 2023-02-25 DIAGNOSIS — I864 Gastric varices: Secondary | ICD-10-CM | POA: Diagnosis not present

## 2023-02-25 DIAGNOSIS — R159 Full incontinence of feces: Secondary | ICD-10-CM | POA: Diagnosis present

## 2023-02-25 DIAGNOSIS — Z87440 Personal history of urinary (tract) infections: Secondary | ICD-10-CM

## 2023-02-25 DIAGNOSIS — Z6823 Body mass index (BMI) 23.0-23.9, adult: Secondary | ICD-10-CM

## 2023-02-25 DIAGNOSIS — Z833 Family history of diabetes mellitus: Secondary | ICD-10-CM

## 2023-02-25 DIAGNOSIS — Z8249 Family history of ischemic heart disease and other diseases of the circulatory system: Secondary | ICD-10-CM | POA: Diagnosis not present

## 2023-02-25 DIAGNOSIS — I8500 Esophageal varices without bleeding: Secondary | ICD-10-CM | POA: Diagnosis not present

## 2023-02-25 DIAGNOSIS — R012 Other cardiac sounds: Secondary | ICD-10-CM | POA: Diagnosis not present

## 2023-02-25 DIAGNOSIS — K5903 Drug induced constipation: Secondary | ICD-10-CM | POA: Diagnosis present

## 2023-02-25 DIAGNOSIS — R001 Bradycardia, unspecified: Secondary | ICD-10-CM | POA: Diagnosis not present

## 2023-02-25 DIAGNOSIS — Z91041 Radiographic dye allergy status: Secondary | ICD-10-CM

## 2023-02-25 DIAGNOSIS — R197 Diarrhea, unspecified: Secondary | ICD-10-CM | POA: Diagnosis not present

## 2023-02-25 DIAGNOSIS — I471 Supraventricular tachycardia, unspecified: Secondary | ICD-10-CM | POA: Diagnosis present

## 2023-02-25 DIAGNOSIS — D509 Iron deficiency anemia, unspecified: Secondary | ICD-10-CM | POA: Diagnosis present

## 2023-02-25 DIAGNOSIS — I10 Essential (primary) hypertension: Secondary | ICD-10-CM | POA: Diagnosis not present

## 2023-02-25 DIAGNOSIS — I480 Paroxysmal atrial fibrillation: Secondary | ICD-10-CM | POA: Diagnosis not present

## 2023-02-25 DIAGNOSIS — R112 Nausea with vomiting, unspecified: Secondary | ICD-10-CM | POA: Diagnosis present

## 2023-02-25 DIAGNOSIS — R7989 Other specified abnormal findings of blood chemistry: Secondary | ICD-10-CM | POA: Diagnosis not present

## 2023-02-25 DIAGNOSIS — K766 Portal hypertension: Secondary | ICD-10-CM | POA: Diagnosis not present

## 2023-02-25 DIAGNOSIS — Z950 Presence of cardiac pacemaker: Secondary | ICD-10-CM

## 2023-02-25 DIAGNOSIS — N3001 Acute cystitis with hematuria: Secondary | ICD-10-CM | POA: Diagnosis not present

## 2023-02-25 DIAGNOSIS — N179 Acute kidney failure, unspecified: Secondary | ICD-10-CM | POA: Diagnosis present

## 2023-02-25 DIAGNOSIS — G9349 Other encephalopathy: Secondary | ICD-10-CM | POA: Diagnosis not present

## 2023-02-25 DIAGNOSIS — I8510 Secondary esophageal varices without bleeding: Secondary | ICD-10-CM | POA: Diagnosis not present

## 2023-02-25 DIAGNOSIS — Z9049 Acquired absence of other specified parts of digestive tract: Secondary | ICD-10-CM

## 2023-02-25 DIAGNOSIS — D638 Anemia in other chronic diseases classified elsewhere: Secondary | ICD-10-CM | POA: Diagnosis present

## 2023-02-25 DIAGNOSIS — I440 Atrioventricular block, first degree: Secondary | ICD-10-CM | POA: Diagnosis present

## 2023-02-25 DIAGNOSIS — Z4659 Encounter for fitting and adjustment of other gastrointestinal appliance and device: Secondary | ICD-10-CM | POA: Diagnosis not present

## 2023-02-25 DIAGNOSIS — Z8261 Family history of arthritis: Secondary | ICD-10-CM

## 2023-02-25 DIAGNOSIS — N390 Urinary tract infection, site not specified: Secondary | ICD-10-CM | POA: Diagnosis not present

## 2023-02-25 DIAGNOSIS — T451X5A Adverse effect of antineoplastic and immunosuppressive drugs, initial encounter: Secondary | ICD-10-CM | POA: Diagnosis present

## 2023-02-25 DIAGNOSIS — R680 Hypothermia, not associated with low environmental temperature: Secondary | ICD-10-CM | POA: Diagnosis present

## 2023-02-25 LAB — CBC WITH DIFFERENTIAL/PLATELET
Abs Immature Granulocytes: 0.01 10*3/uL (ref 0.00–0.07)
Basophils Absolute: 0 10*3/uL (ref 0.0–0.1)
Basophils Relative: 1 %
Eosinophils Absolute: 0.1 10*3/uL (ref 0.0–0.5)
Eosinophils Relative: 1 %
HCT: 36.4 % (ref 36.0–46.0)
Hemoglobin: 12.1 g/dL (ref 12.0–15.0)
Immature Granulocytes: 0 %
Lymphocytes Relative: 18 %
Lymphs Abs: 0.7 10*3/uL (ref 0.7–4.0)
MCH: 34.2 pg — ABNORMAL HIGH (ref 26.0–34.0)
MCHC: 33.2 g/dL (ref 30.0–36.0)
MCV: 102.8 fL — ABNORMAL HIGH (ref 80.0–100.0)
Monocytes Absolute: 0.6 10*3/uL (ref 0.1–1.0)
Monocytes Relative: 15 %
Neutro Abs: 2.7 10*3/uL (ref 1.7–7.7)
Neutrophils Relative %: 65 %
Platelets: 142 10*3/uL — ABNORMAL LOW (ref 150–400)
RBC: 3.54 MIL/uL — ABNORMAL LOW (ref 3.87–5.11)
RDW: 18.4 % — ABNORMAL HIGH (ref 11.5–15.5)
WBC: 4.1 10*3/uL (ref 4.0–10.5)
nRBC: 0 % (ref 0.0–0.2)

## 2023-02-25 LAB — COMPREHENSIVE METABOLIC PANEL
ALT: 58 U/L — ABNORMAL HIGH (ref 0–44)
AST: 85 U/L — ABNORMAL HIGH (ref 15–41)
Albumin: 3.1 g/dL — ABNORMAL LOW (ref 3.5–5.0)
Alkaline Phosphatase: 274 U/L — ABNORMAL HIGH (ref 38–126)
Anion gap: 13 (ref 5–15)
BUN: 38 mg/dL — ABNORMAL HIGH (ref 8–23)
CO2: 26 mmol/L (ref 22–32)
Calcium: 9.8 mg/dL (ref 8.9–10.3)
Chloride: 98 mmol/L (ref 98–111)
Creatinine, Ser: 1.13 mg/dL — ABNORMAL HIGH (ref 0.44–1.00)
GFR, Estimated: 52 mL/min — ABNORMAL LOW (ref >60)
Glucose, Bld: 101 mg/dL — ABNORMAL HIGH (ref 70–99)
Potassium: 3.4 mmol/L — ABNORMAL LOW (ref 3.5–5.1)
Sodium: 137 mmol/L (ref 135–145)
Total Bilirubin: 2.1 mg/dL — ABNORMAL HIGH (ref 0.3–1.2)
Total Protein: 7.2 g/dL (ref 6.5–8.1)

## 2023-02-25 LAB — PROTIME-INR
INR: 1.5 — ABNORMAL HIGH (ref 0.8–1.2)
Prothrombin Time: 17.5 seconds — ABNORMAL HIGH (ref 11.4–15.2)

## 2023-02-25 LAB — URINALYSIS, ROUTINE W REFLEX MICROSCOPIC
Bilirubin Urine: NEGATIVE
Glucose, UA: NEGATIVE mg/dL
Ketones, ur: NEGATIVE mg/dL
Nitrite: NEGATIVE
Protein, ur: NEGATIVE mg/dL
Specific Gravity, Urine: 1 — ABNORMAL LOW (ref 1.005–1.030)
pH: 5 (ref 5.0–8.0)

## 2023-02-25 LAB — AMMONIA: Ammonia: 163 umol/L — ABNORMAL HIGH (ref 9–35)

## 2023-02-25 LAB — TSH: TSH: 1.26 u[IU]/mL (ref 0.350–4.500)

## 2023-02-25 MED ORDER — SODIUM CHLORIDE 0.9 % IV SOLN
1.0000 g | INTRAVENOUS | Status: DC
  Administered 2023-02-25: 1 g via INTRAVENOUS
  Filled 2023-02-25: qty 10

## 2023-02-25 MED ORDER — LACTATED RINGERS IV BOLUS
500.0000 mL | Freq: Once | INTRAVENOUS | Status: AC
  Administered 2023-02-25: 500 mL via INTRAVENOUS

## 2023-02-25 NOTE — Telephone Encounter (Signed)
I spoke with pts husband (DPR signed) when pt woke up this morning temp 94.5 and later in morning after pt had blankets on her and temp was 95.2. pt is asleep now and cannot take temp per husband. Pt tremors seem worse; when pt is awake pts husband said that pt knows the date and who she is and who the president is. Pt is very lethargic and pts husband was concerned about pt seems present but is not really there. Pt has had vomiting on and off but before she went to sleep this afternoon she was able to eat a small baked potato. Pt is having a lot of diarrhea. Pts husband is concerned may have urinary issues. Matt said that if temp is really that low she needs to go to ED because tremors might be shivering to warm her body. Pt has just woke up and temp now is 96 but pt is still not feeling well and Mr Kruizenga will take pt to [facility] ED now. Mr Giovannetti will call with update on pt 02/26/23. Sending note to Romilda Garret NP and I spoke with Romilda Garret also.

## 2023-02-25 NOTE — ED Triage Notes (Signed)
Pt here with reports having a temp of 64F at home. Family was able to get temp up to 96.5. Seen here recently for elevated ammonia levels.pt has not had decent bowel movements since being discharged. Added miralax to daily medications. Per family pt not at baseline mentally. Pt also with hx multiple UTI's this year and she presents with hypothermia with those.

## 2023-02-25 NOTE — Telephone Encounter (Signed)
Patient husband called in to follow up on x-ray results. He stated that patient has went [place]. He stated that the tremors are worse, she vomited last night, and slept from 11 last night until now. He stated that her temperature was 94.5 and he believes she has an infection. He would like any suggestions but he don't want to have to take her to the ER. Thank you!

## 2023-02-25 NOTE — ED Provider Notes (Signed)
[place] EMERGENCY DEPARTMENT AT [facility] Provider Note   CSN: [id number] Arrival date & time: 02/25/23  1842     History  No chief complaint on file.   [name] is a 72 y.o. female with PMH hypothyroidism, prior colon cancer s/p partial colectomy, cirrhosis presenting to the emergency department with altered mental status.  Her husband is with her and provides part of her history.  He states that she has been slightly confused and acting abnormally over the past couple days.  They have been trying to titrate lactulose and MiraLAX to get her back to her goal number of bowel movements today.  She has been having a little bit of diarrhea as a result.  He is concerned that because of her diarrhea and her wearing depends, and her history of UTIs, that she currently has a UTI.  She does endorse lower abdominal pressure, but no dysuria, changes in urinary frequency, or hematuria.  She had some nausea or vomiting yesterday, but no hematemesis.  No fevers or chills, chest pain, dyspnea.  Home Medications Prior to Admission medications   Medication Sig Start Date End Date Taking? Authorizing Provider  acidophilus (RISAQUAD) CAPS capsule Take 1 capsule by mouth daily.    [provider]  albuterol (VENTOLIN HFA) 108 (90 Base) MCG/ACT inhaler Inhale 1-2 puffs into the lungs every 6 (six) hours as needed for wheezing or shortness of breath. 11/14/22   Francene Finders, PA-C  Ascorbic Acid (VITAMIN C PO) Take 2,000 mg by mouth daily.    [provider]  AYR SALINE NASAL NA Place 1 spray into the nose daily as needed (congestion).    [provider]  Cholecalciferol (VITAMIN D3) 50 MCG (2000 UT) TABS Take 2,000 Units by mouth at bedtime.     [provider]  Cranberry 50 MG CHEW Chew 50 mg by mouth 2 (two) times daily.    [provider]  doxycycline (VIBRAMYCIN) 50 MG capsule Take 50 mg by mouth daily. 12/17/22   [provider]   ELDERBERRY PO Take 15-30 mLs by mouth See admin instructions. Take 15 ml in the morning and 30 ml at night    [provider]  estradiol (ESTRACE) 0.1 MG/GM vaginal cream Place vaginally every other day. 11/09/22   [provider]  furosemide (LASIX) 20 MG tablet Take 2 tablets (40 mg total) by mouth daily. Hold until you see your liver doctor 05/05/22 05/05/23  Lorella Nimrod, MD  lactulose (CHRONULAC) 10 GM/15ML solution Take 30 g by mouth 3 (three) times daily. 08/13/22   [provider]  lactulose, encephalopathy, (CHRONULAC) 10 GM/15ML SOLN Take 60 mLs by mouth 3 (three) times daily. 02/08/23 02/08/24  [provider]  levOCARNitine (CARNITOR) 330 MG tablet Take 330 mg by mouth 3 (three) times daily. 05/23/22   [provider]  Multiple Vitamin (MULTI-VITAMIN) tablet Take 1 tablet by mouth daily.    [provider]  nitrofurantoin, macrocrystal-monohydrate, (MACROBID) 100 MG capsule Take 1 capsule (100 mg total) by mouth 2 (two) times daily. 11/05/22   Tonia Ghent, MD  spironolactone (ALDACTONE) 50 MG tablet Take 0.5 tablets (25 mg total) by mouth daily. 11/05/22 11/05/23  Tonia Ghent, MD  SYNTHROID 75 MCG tablet Take 1 tablet (75 mcg total) by mouth daily before breakfast. Must be seen in office for more refills. 12/03/22   Dutch Quint B, FNP  zinc gluconate 50 MG tablet Take 50 mg by mouth 3 (  three) times daily.    [provider]      Allergies    Contrast media [iodinated contrast media]    Review of Systems   See HPI  Physical Exam Updated Vital Signs BP 124/68   Pulse 74   Temp 97.7 F (36.5 C) (Oral)   Resp 18   SpO2 95%  Physical Exam Constitutional:      General: She is not in acute distress.    Appearance: She is ill-appearing.  HENT:     Head: Normocephalic and atraumatic.  Eyes:     Extraocular Movements: Extraocular movements intact.     Pupils: Pupils are equal, round, and reactive to light.   Cardiovascular:     Rate and Rhythm: Normal rate and regular rhythm.     Heart sounds: Murmur (2/6 systolic murmur right upper sternal border) heard.  Pulmonary:     Effort: Pulmonary effort is normal. No respiratory distress.     Breath sounds: Normal breath sounds.  Abdominal:     General: There is no distension.     Palpations: Abdomen is soft.     Tenderness: There is abdominal tenderness (Slight suprapubic tenderness). There is no guarding.  Musculoskeletal:     Cervical back: Normal range of motion. No rigidity or tenderness.     Right lower leg: No edema.     Left lower leg: No edema.  Skin:    General: Skin is dry.     Comments: Cool and dry  Neurological:     Mental Status: She is alert and oriented to person, place, and time.     Comments: Asterixis present  Psychiatric:        Mood and Affect: Mood normal.        Behavior: Behavior normal.     ED Results / Procedures / Treatments   Labs (all labs ordered are listed, but only abnormal results are displayed) Labs Reviewed  URINALYSIS, ROUTINE W REFLEX MICROSCOPIC - Abnormal; Notable for the following components:      Result Value   APPearance HAZY (*)    Specific Gravity, Urine 1.000 (*)    Hgb urine dipstick MODERATE (*)    Leukocytes,Ua LARGE (*)    Bacteria, UA RARE (*)    All other components within normal limits  COMPREHENSIVE METABOLIC PANEL - Abnormal; Notable for the following components:   Potassium 3.4 (*)    Glucose, Bld 101 (*)    BUN 38 (*)    Creatinine, Ser 1.13 (*)    Albumin 3.1 (*)    AST 85 (*)    ALT 58 (*)    Alkaline Phosphatase 274 (*)    Total Bilirubin 2.1 (*)    GFR, Estimated 52 (*)    All other components within normal limits  CBC WITH DIFFERENTIAL/PLATELET - Abnormal; Notable for the following components:   RBC 3.54 (*)    MCV 102.8 (*)    MCH 34.2 (*)    RDW 18.4 (*)    Platelets 142 (*)    All other components within normal limits  AMMONIA - Abnormal; Notable for the  following components:   Ammonia 163 (*)    All other components within normal limits  PROTIME-INR - Abnormal; Notable for the following components:   Prothrombin Time 17.5 (*)    INR 1.5 (*)    All other components within normal limits  URINE CULTURE  TSH    EKG None  Radiology DG Abd 2 Views  Result  Date: 02/25/2023 CLINICAL DATA:  abdominal tenderness, fecal leakage, hostory of constipatoin EXAM: ABDOMEN - 2 VIEW COMPARISON:  02/09/2023 FINDINGS: Normal abdominal gas pattern. Anastomotic staple line noted within the right mid abdomen. No free intraperitoneal gas. No organomegaly. Tips shunt noted. Angular calcifications within the right upper quadrant in keeping with gallstones. Osseous structures are age-appropriate. IMPRESSION: 1. Nonobstructive abdominal gas pattern. 2. Cholelithiasis. Electronically Signed   By: Fidela Salisbury M.D.   On: 02/25/2023 21:49    Procedures   Medications Ordered in ED Medications  cefTRIAXone (ROCEPHIN) 1 g in sodium chloride 0.9 % 100 mL IVPB (1 g Intravenous New Bag/Given 02/25/23 2147)  lactated ringers bolus 500 mL (500 mLs Intravenous New Bag/Given 02/25/23 2146)    ED Course/ Medical Decision Making/ A&P Clinical Course as of 02/25/23 2154  Thu Feb 25, 2023  2033 Stable 84 YOF with a chief complaint of AMS  Extensive medical history including ESLD Worsening abdominal discomfort/fatigue.  Has asterixis and near mental status baseline.  Workup for AMS in elderly [CC]  2056 Ammonia(!): 163 [CC]  2108 Evaluated personally at bedside.  Still pending urine sample at this time.  However patient has been altered at home, husband does not feel safe with her ongoing care.  Her ammonia is elevated at 163.  Since she started oxycodone she has not been having as robust a response to her lactulose and is now having decreased p.o. intake in total. If she has urinary tract infection this may be the underlying cause, however if the UA is nondiagnostic, likely  her hypoalbuminemia.  She will require admission to the stepdown/progressive unit due to her requirement for [facility].  Temperature taken at this time still 93.8. [CC]    Clinical Course User Index [CC] Tretha Sciara, MD                            Medical Decision Making Amount and/or Complexity of Data Reviewed Labs: ordered. Decision-making details documented in ED Course.  Risk Decision regarding hospitalization.   Breelynn Grigg is a 72 y.o. female with PMH hypothyroidism, prior colon cancer s/p partial colectomy, cirrhosis presenting to the emergency department with altered mental status.  She has been trying to titrate the appropriate dose of lactulose and MiraLAX with her doctor to get back to her regular number of bowel movements today.  Husband is also concerned about UTI as she has been having diarrhea and is wearing depends.  She also has a history of recurrent UTIs.  She has asterixis present on exam as well as some suprapubic tenderness.  She is hypothermic on presentation approximately 94 degrees.  Husband states that this sometimes happens when she has UTI.  No ascites on exam.  Bair hugger was placed.  Will check ammonia, baseline blood work, and urine studies and proceed as appropriate.  Will also recheck TSH given her history of hypothyroidism and hypothermia on presentation.  Update: Ammonia is significantly elevated at 163 compared to 71 2 weeks ago.  UA is consistent with UTI.  Mild AKI with creatinine 1.13, LFTs, PT/ INR consistent with those obtained a few weeks ago.  TSH is 1.26.  Upon my reassessment, patient is comfortable with Bair hugger in place.  At this point, believe she requires admission for hepatic encephalopathy in addition to UTI.  Will initiate Rocephin and also give her small fluid bolus for her AKI and consult hospitalist for admission, likely to progressive care  unit given her temperature.   Final Clinical Impression(s) / ED Diagnoses Final  diagnoses:  Hepatic encephalopathy  Acute cystitis with hematuria    Rx / DC Orders ED Discharge Orders     None         Linus Galas, MD 02/25/23 2154    Tretha Sciara, MD 02/25/23 2214    Tretha Sciara, MD 02/25/23 2228

## 2023-02-25 NOTE — Telephone Encounter (Signed)
Imaging has not been read. Will send to triage for further information

## 2023-02-25 NOTE — ED Notes (Signed)
Pt needs a rectal temp, temp would not read orally or axillary.  Husband said temp would not read at home either.

## 2023-02-26 ENCOUNTER — Inpatient Hospital Stay (HOSPITAL_COMMUNITY): Payer: Medicare Other

## 2023-02-26 ENCOUNTER — Other Ambulatory Visit: Payer: Self-pay

## 2023-02-26 DIAGNOSIS — E43 Unspecified severe protein-calorie malnutrition: Secondary | ICD-10-CM | POA: Insufficient documentation

## 2023-02-26 DIAGNOSIS — D649 Anemia, unspecified: Secondary | ICD-10-CM | POA: Diagnosis not present

## 2023-02-26 DIAGNOSIS — K7460 Unspecified cirrhosis of liver: Secondary | ICD-10-CM

## 2023-02-26 DIAGNOSIS — Z85038 Personal history of other malignant neoplasm of large intestine: Secondary | ICD-10-CM

## 2023-02-26 DIAGNOSIS — K8020 Calculus of gallbladder without cholecystitis without obstruction: Secondary | ICD-10-CM

## 2023-02-26 DIAGNOSIS — R7989 Other specified abnormal findings of blood chemistry: Secondary | ICD-10-CM

## 2023-02-26 DIAGNOSIS — N3001 Acute cystitis with hematuria: Secondary | ICD-10-CM

## 2023-02-26 DIAGNOSIS — I959 Hypotension, unspecified: Secondary | ICD-10-CM

## 2023-02-26 DIAGNOSIS — K7682 Hepatic encephalopathy: Secondary | ICD-10-CM | POA: Diagnosis not present

## 2023-02-26 DIAGNOSIS — N390 Urinary tract infection, site not specified: Secondary | ICD-10-CM

## 2023-02-26 LAB — COMPREHENSIVE METABOLIC PANEL
ALT: 46 U/L — ABNORMAL HIGH (ref 0–44)
AST: 70 U/L — ABNORMAL HIGH (ref 15–41)
Albumin: 2.5 g/dL — ABNORMAL LOW (ref 3.5–5.0)
Alkaline Phosphatase: 227 U/L — ABNORMAL HIGH (ref 38–126)
Anion gap: 10 (ref 5–15)
BUN: 33 mg/dL — ABNORMAL HIGH (ref 8–23)
CO2: 25 mmol/L (ref 22–32)
Calcium: 8.9 mg/dL (ref 8.9–10.3)
Chloride: 104 mmol/L (ref 98–111)
Creatinine, Ser: 0.98 mg/dL (ref 0.44–1.00)
GFR, Estimated: >60 mL/min (ref >60)
Glucose, Bld: 63 mg/dL — ABNORMAL LOW (ref 70–99)
Potassium: 3.4 mmol/L — ABNORMAL LOW (ref 3.5–5.1)
Sodium: 139 mmol/L (ref 135–145)
Total Bilirubin: 1.6 mg/dL — ABNORMAL HIGH (ref 0.3–1.2)
Total Protein: 5.9 g/dL — ABNORMAL LOW (ref 6.5–8.1)

## 2023-02-26 LAB — GLUCOSE, CAPILLARY
Glucose-Capillary: 62 mg/dL — ABNORMAL LOW (ref 70–99)
Glucose-Capillary: 127 mg/dL — ABNORMAL HIGH (ref 70–99)
Glucose-Capillary: 118 mg/dL — ABNORMAL HIGH (ref 70–99)
Glucose-Capillary: 67 mg/dL — ABNORMAL LOW (ref 70–99)
Glucose-Capillary: 93 mg/dL (ref 70–99)
Glucose-Capillary: 89 mg/dL (ref 70–99)
Glucose-Capillary: 92 mg/dL (ref 70–99)

## 2023-02-26 LAB — URINE CULTURE: Culture: NO GROWTH

## 2023-02-26 LAB — PHOSPHORUS: Phosphorus: 3.4 mg/dL (ref 2.5–4.6)

## 2023-02-26 LAB — CBC
HCT: 32.1 % — ABNORMAL LOW (ref 36.0–46.0)
Hemoglobin: 10.6 g/dL — ABNORMAL LOW (ref 12.0–15.0)
MCH: 35 pg — ABNORMAL HIGH (ref 26.0–34.0)
MCHC: 33 g/dL (ref 30.0–36.0)
MCV: 105.9 fL — ABNORMAL HIGH (ref 80.0–100.0)
Platelets: 109 10*3/uL — ABNORMAL LOW (ref 150–400)
RBC: 3.03 MIL/uL — ABNORMAL LOW (ref 3.87–5.11)
RDW: 18.8 % — ABNORMAL HIGH (ref 11.5–15.5)
WBC: 3.6 10*3/uL — ABNORMAL LOW (ref 4.0–10.5)
nRBC: 0 % (ref 0.0–0.2)

## 2023-02-26 LAB — MAGNESIUM: Magnesium: 1.7 mg/dL (ref 1.7–2.4)

## 2023-02-26 LAB — MRSA NEXT GEN BY PCR, NASAL: MRSA by PCR Next Gen: NOT DETECTED

## 2023-02-26 LAB — AMMONIA: Ammonia: 163 umol/L — ABNORMAL HIGH (ref 9–35)

## 2023-02-26 LAB — LACTIC ACID, PLASMA: Lactic Acid, Venous: 1.3 mmol/L (ref 0.5–1.9)

## 2023-02-26 MED ORDER — SODIUM CHLORIDE 0.9 % IV BOLUS
250.0000 mL | INTRAVENOUS | Status: AC
  Administered 2023-02-26: 250 mL via INTRAVENOUS

## 2023-02-26 MED ORDER — DEXTROSE 50 % IV SOLN
INTRAVENOUS | Status: AC
  Filled 2023-02-26: qty 50

## 2023-02-26 MED ORDER — MIDODRINE HCL 5 MG PO TABS
5.0000 mg | ORAL_TABLET | Freq: Three times a day (TID) | ORAL | Status: DC
  Administered 2023-02-26: 5 mg via ORAL
  Filled 2023-02-26: qty 1

## 2023-02-26 MED ORDER — CHLORHEXIDINE GLUCONATE CLOTH 2 % EX PADS
6.0000 | MEDICATED_PAD | Freq: Every day | CUTANEOUS | Status: DC
  Administered 2023-02-26 – 2023-03-08 (×11): 6 via TOPICAL

## 2023-02-26 MED ORDER — DEXTROSE 50 % IV SOLN
12.5000 g | Freq: Once | INTRAVENOUS | Status: AC
  Administered 2023-02-26: 12.5 g via INTRAVENOUS

## 2023-02-26 MED ORDER — MIDODRINE HCL 5 MG PO TABS
10.0000 mg | ORAL_TABLET | ORAL | Status: AC
  Administered 2023-02-26: 10 mg via ORAL
  Filled 2023-02-26: qty 2

## 2023-02-26 MED ORDER — MIDODRINE HCL 5 MG PO TABS: 2.5000 mg | ORAL_TABLET | Freq: Three times a day (TID) | ORAL | Status: DC

## 2023-02-26 MED ORDER — ORAL CARE MOUTH RINSE: 15.0000 mL | OROMUCOSAL | Status: DC | PRN

## 2023-02-26 MED ORDER — LEVOTHYROXINE SODIUM 75 MCG PO TABS
75.0000 ug | ORAL_TABLET | Freq: Every day | ORAL | Status: DC
  Administered 2023-02-27 – 2023-03-04 (×6): 75 ug
  Filled 2023-02-26 (×6): qty 1

## 2023-02-26 MED ORDER — LACTULOSE 10 GM/15ML PO SOLN
40.0000 g | Freq: Three times a day (TID) | ORAL | Status: DC
  Administered 2023-02-26 (×2): 40 g via ORAL
  Filled 2023-02-26 (×2): qty 60

## 2023-02-26 MED ORDER — SODIUM CHLORIDE 0.9 % IV BOLUS
250.0000 mL | Freq: Once | INTRAVENOUS | Status: AC
  Administered 2023-02-26: 250 mL via INTRAVENOUS

## 2023-02-26 MED ORDER — MIDODRINE HCL 5 MG PO TABS: 20.0000 mg | ORAL_TABLET | ORAL | Status: DC

## 2023-02-26 MED ORDER — LACTULOSE ENEMA
300.0000 mL | Freq: Three times a day (TID) | ORAL | Status: AC
  Administered 2023-02-26 – 2023-02-27 (×5): 300 mL via RECTAL
  Filled 2023-02-26 (×5): qty 300

## 2023-02-26 MED ORDER — FUROSEMIDE 40 MG PO TABS: 40.0000 mg | ORAL_TABLET | Freq: Every day | ORAL | Status: DC

## 2023-02-26 MED ORDER — SPIRONOLACTONE 25 MG PO TABS: 25.0000 mg | ORAL_TABLET | Freq: Every day | ORAL | Status: DC

## 2023-02-26 MED ORDER — LEVOTHYROXINE SODIUM 75 MCG PO TABS
75.0000 ug | ORAL_TABLET | Freq: Every day | ORAL | Status: DC
  Filled 2023-02-26: qty 1

## 2023-02-26 MED ORDER — ZINC OXIDE 40 % EX OINT
TOPICAL_OINTMENT | Freq: Every day | CUTANEOUS | Status: DC | PRN
  Administered 2023-02-26: 1 via TOPICAL
  Filled 2023-02-26 (×2): qty 57

## 2023-02-26 MED ORDER — LACTULOSE ENEMA
300.0000 mL | Freq: Once | ORAL | Status: DC
  Filled 2023-02-26: qty 300

## 2023-02-26 MED ORDER — ALBUMIN HUMAN 25 % IV SOLN
25.0000 g | Freq: Four times a day (QID) | INTRAVENOUS | Status: AC
  Administered 2023-02-26 (×2): 25 g via INTRAVENOUS
  Filled 2023-02-26 (×2): qty 100

## 2023-02-26 MED ORDER — POTASSIUM CHLORIDE CRYS ER 20 MEQ PO TBCR: 40.0000 meq | EXTENDED_RELEASE_TABLET | Freq: Once | ORAL | Status: DC

## 2023-02-26 MED ORDER — POLYETHYLENE GLYCOL 3350 17 G PO PACK: 17.0000 g | PACK | Freq: Every day | ORAL | Status: DC | PRN

## 2023-02-26 MED ORDER — ONDANSETRON HCL 4 MG/2ML IJ SOLN: 4.0000 mg | Freq: Four times a day (QID) | INTRAMUSCULAR | Status: DC | PRN

## 2023-02-26 MED ORDER — PROCHLORPERAZINE EDISYLATE 10 MG/2ML IJ SOLN: 5.0000 mg | Freq: Four times a day (QID) | INTRAMUSCULAR | Status: DC | PRN

## 2023-02-26 MED ORDER — DEXTROSE-NACL 5-0.9 % IV SOLN: INTRAVENOUS | Status: DC

## 2023-02-26 MED ORDER — MIDODRINE HCL 5 MG PO TABS
5.0000 mg | ORAL_TABLET | Freq: Three times a day (TID) | ORAL | Status: DC
  Administered 2023-02-27 – 2023-02-28 (×6): 5 mg
  Filled 2023-02-26 (×8): qty 1

## 2023-02-26 MED ORDER — POTASSIUM CHLORIDE CRYS ER 20 MEQ PO TBCR
40.0000 meq | EXTENDED_RELEASE_TABLET | Freq: Once | ORAL | Status: AC
  Administered 2023-02-26: 40 meq via ORAL
  Filled 2023-02-26: qty 2

## 2023-02-26 MED ORDER — LACTULOSE 10 GM/15ML PO SOLN
30.0000 g | Freq: Four times a day (QID) | ORAL | Status: DC
  Administered 2023-02-26 – 2023-03-04 (×22): 30 g
  Filled 2023-02-26 (×25): qty 45

## 2023-02-26 MED ORDER — SODIUM CHLORIDE 0.9 % IV SOLN
2.0000 g | INTRAVENOUS | Status: DC
  Administered 2023-02-26: 2 g via INTRAVENOUS
  Filled 2023-02-26: qty 20

## 2023-02-26 NOTE — Progress Notes (Signed)
  Transition of Care [facility]) Screening Note   Patient Details  Name: [name] Date of Birth: 11/22/1951   Transition of Care [facility]) CM/SW Contact:    Lavenia Atlas, RN Phone Number: 02/26/2023, 6:22 PM     Transition of Care Department [facility]) has reviewed patient and no TOC needs have been identified at this time. We will continue to monitor patient advancement through interdisciplinary progression rounds. If new patient transition needs arise, please place a TOC consult.

## 2023-02-26 NOTE — Progress Notes (Signed)
PT Cancellation Note  Patient Details Name: [name] MRN: [id number] DOB: 30-Jul-1951   Cancelled Treatment:    Reason Eval/Treat Not Completed: Medical issues which prohibited therapy, per RN, hold therapy today.  Blanchard Kelch PT Acute Rehabilitation Services Office [phone] Weekend pager-[phone]    Rada Hay 02/26/2023, 8:13 AM

## 2023-02-26 NOTE — Progress Notes (Signed)
[facility]  PROGRESS NOTE  Liberty HandyMary Lynn Hawe ONG:[id number] DOB: 01/09/1951 DOA: 02/25/2023 PCP: [name], [name], NP   Brief HPI:   72 year old female with history of colon cancer thousand +3, status post partial colectomy and chemotherapy, history of liver cirrhosis felt secondary to previous chemotherapy with oxaliplatin, [facility], hepatic encephalopathy, came to the hospital with confusion for past 2 days. As per patient has been patient broke her left wrist in March at that time she was started on opioid-based analgesics which caused constipation.  Patient's dose of lactulose was increased from 45 cc 3 times a day to 60 cc 3 times a day. Despite this patient was not having bowel movements, she has been incontinent of urine and stools. Patient has been more altered with nausea vomiting for past 2 days.  Missed dose of lactulose.  Was found to have temperature of 94 F at home. In the ED she was found to be lethargic and hypothermic.  Bair hugger was applied and hypothermia resolved. UA was positive for pyuria, ammonia was elevated at 163.    Subjective   This morning patient has been unresponsive, hardly opens eyes to sternal rub.  Repeat ammonia level again shows elevated at 163.  Patient received last dose of lactulose last night.  Also has been having episodes of hypoglycemia, requiring multiple ampules of D50.  She did receive midodrine for hypotension.   Assessment/Plan:   Acute hepatic encephalopathy -Ammonia level elevated at 163 on admission -still obtunded, repeat ammonia level is still 163 -Lactulose 200 grams enema ordered -Gastroenterology has started lactulose enema 200 g every 8 hours -Also recommend inserting NG tube with lactulose to be given via NG tube  Hypoglycemia -Due to poor p.o. intake, patient is obtunded -Has been having episodes of hypoglycemia -Required multiple rounds of D50 -Will start D5 normal saline at 50  mL/h  Hypothermia -Secondary to UTI -Resolved  Acute kidney injury -Secondary to poor p.o. intake -Resolved  Hypotension -Blood pressure has been soft -Required 250 cc normal saline bolus this morning -Start midodrine 5 mg 3 times daily via NG tube  Liver cirrhosis with portal hypertension s/p TIPS procedure in 2012 -Developed liver cirrhosis after chemotherapy with oxaliplatin -Followed by gastroenterology -Follow LFTs in a.m. -MELD score 15, 6% estimated 6972-month mortality   UTI -Abnormal UA -Follow urine culture results -Continue IV Rocephin     Medications     Chlorhexidine Gluconate Cloth  6 each Topical Daily   lactulose  30 g Per Tube Q6H   lactulose  300 mL Rectal Q8H   levothyroxine  75 mcg Oral Q0600   midodrine  2.5 mg Oral TID WC     Data Reviewed:   CBG:  Recent Labs  Lab 02/26/23 0721 02/26/23 0745 02/26/23 1200 02/26/23 1224  GLUCAP 62* 127* 67* 118*    SpO2: 95 %    Vitals:   02/26/23 1230 02/26/23 1300 02/26/23 1330 02/26/23 1338  BP: (!) 96/31 (!) 84/28 (!) 74/36 (!) 96/42  Pulse: 73 70 62 64  Resp: 14 12 13 10   Temp:      TempSrc:      SpO2: 94% 93% 94% 95%  Weight:      Height:          Data Reviewed:  Basic Metabolic Panel: Recent Labs  Lab 02/25/23 2025 02/26/23 0214  NA 137 139  K 3.4* 3.4*  CL 98 104  CO2 26 25  GLUCOSE 101*  63*  BUN 38* 33*  CREATININE 1.13* 0.98  CALCIUM 9.8 8.9  MG  --  1.7  PHOS  --  3.4    CBC: Recent Labs  Lab 02/25/23 2025 02/26/23 0214  WBC 4.1 3.6*  NEUTROABS 2.7  --   HGB 12.1 10.6*  HCT 36.4 32.1*  MCV 102.8* 105.9*  PLT 142* 109*    LFT Recent Labs  Lab 02/25/23 2025 02/26/23 0214  AST 85* 70*  ALT 58* 46*  ALKPHOS 274* 227*  BILITOT 2.1* 1.6*  PROT 7.2 5.9*  ALBUMIN 3.1* 2.5*     Antibiotics: Anti-infectives (From admission, onward)    Start     Dose/Rate Route Frequency Ordered Stop   02/26/23 2200  cefTRIAXone (ROCEPHIN) 2 g in sodium chloride  0.9 % 100 mL IVPB        2 g 200 mL/hr over 30 Minutes Intravenous Every 24 hours 02/26/23 0034     02/25/23 2200  cefTRIAXone (ROCEPHIN) 1 g in sodium chloride 0.9 % 100 mL IVPB  Status:  Discontinued        1 g 200 mL/hr over 30 Minutes Intravenous Every 24 hours 02/25/23 2118 02/26/23 0034        DVT prophylaxis: SCDs  Code Status: Full code  Family Communication: Discussed with patient's husband at bedside   CONSULTS gastroenterology   Objective    Physical Examination:   General: Appears lethargic Cardiovascular: S1-S2, regular Respiratory: Lungs clear to auscultation bilaterally Abdomen: Soft, nontender Extremities: No edema in the lower extremities Neurologic: Lethargic, barely opens eyes to verbal stimuli, does not follow commands   Status is: Inpatient:             Meredeth Ide   [facility] If 7PM-7AM, please contact night-coverage at [email or web address], Office  [phone]   02/26/2023, 2:11 PM  LOS: 1 day

## 2023-02-26 NOTE — Telephone Encounter (Signed)
Noted. Patient went to the emergency department and was admitted

## 2023-02-26 NOTE — Progress Notes (Signed)
Initial Nutrition Assessment  DOCUMENTATION CODES:   Severe malnutrition in context of acute illness/injury  INTERVENTION:  - Per MD, waiting to start tube feeds at this time. Plan to provide lactulose today.  - Once feeding tube placed and xray verified and once medically appropriate to start tube feeds, recommend below TF regimen: Osmolite 1.5 at 65 ml/h (1560 ml per day *Start at 5020mL/hr and advance by 10mL every 12 hours Provides 2340 kcal, 98 gm protein, 1189 ml free water daily  - Monitor magnesium, potassium, and phosphorus BID for at least 3 days, MD to replete as needed, as pt is at risk for refeeding syndrome given malnutrition with a 23# (14.6%) weight loss in 3 weeks and minimal oral intake ~1 month.  - Recommend 100mg  thiamine x5 days due to risk of refeeding.   - Monitor weight trends.    NUTRITION DIAGNOSIS:   Severe Malnutrition related to acute illness as evidenced by moderate fat depletion, moderate muscle depletion, energy intake < or equal to 50% for > or equal to 5 days, percent weight loss (14.6% in 3 weeks).  GOAL:   Patient will meet greater than or equal to 90% of their needs  MONITOR:   Diet advancement, Labs, Weight trends, TF tolerance  REASON FOR ASSESSMENT:   Malnutrition Screening Tool    ASSESSMENT:   72 y.o. female with medical history significant for prior colon cancer diagnosed in 2003 s/p partial colectomy and chemotherapy, nonalcoholic decompensated cirrhosis s/p TIPS placement, recent wrist fracture (01/29/23) who presented to ED due to confusion for the past 2 days.  Patient not alert or oriented at time of visit. Noted to be admitted with hepatic encephalopathy.  Husband at bedside provided all of nutrition related history.   He reports patient UBW to be around 157# and notes she has lost almost 20# in the past ~3 weeks since breaking her wrist.  Per EMR, patient weighed at 157# on 3/14 and weighed this admission at 134#, which is a  23# or 14.6% weight loss in 3 weeks, severe for the time frame.   Husband reports that over the past month patient has eaten very little. Husband has been cooking and encouraging patient to eat but she would often refuse meals or only eat a little. Has also had vomiting the past two days so hasn't kept hardly anything down. Prior to not eating well husband reports the patient was doing the best she had in years. They were emphasizing protein at meals (pt prefers chicken, fish, and [place]) and he was adding unflavored whey protein to her meals to boost protein.  Patient previously on a diet this AM but per RN, patient not alert enough for oral intake or medications.  GI saw patient after visit and made patient NPO and placed order for small bore feeding tube.   Patient discussed with MD. Recommended starting tube feeds as patient malnourished, has had minimal nutrition for ~1 month and has lost over 20# in the past 3 weeks.  Per attending Dr. Sharl MaLama, patient obtunded so plan to provide lactulose via tube first and then decide about tube feeds.  Tube feed recommendations above for when able to start tube feeds. Patient is at high risk for refeeding syndrome given malnutrition. Would recommend slow advancement and close monitoring of electrolytes.    Medications reviewed and include: Lasix  Labs reviewed:  K+ 3.4   NUTRITION - FOCUSED PHYSICAL EXAM:  Flowsheet Row Most Recent Value  Orbital Region Moderate depletion  Upper Arm Region Mild depletion  Thoracic and Lumbar Region Mild depletion  Buccal Region Moderate depletion  Temple Region Severe depletion  Clavicle Bone Region Mild depletion  Clavicle and Acromion Bone Region Moderate depletion  Scapular Bone Region Unable to assess  Dorsal Hand Mild depletion  Patellar Region Mild depletion  Anterior Thigh Region Mild depletion  Posterior Calf Region No depletion  Edema (RD Assessment) None  Hair Reviewed  Eyes Reviewed  Mouth  Reviewed  Skin Reviewed  Nails Reviewed       Diet Order:   Diet Order             Diet NPO time specified  Diet effective now                   EDUCATION NEEDS:  Education needs have been addressed  Skin:  Skin Assessment: Skin Integrity Issues: Skin Integrity Issues:: Other (Comment) Other: IAD - buttocks  Last BM:  4/5 - fecal management system  Height:  Ht Readings from Last 1 Encounters:  02/26/23 5\' 4"  (1.626 m)   Weight:  Wt Readings from Last 1 Encounters:  02/26/23 61 kg    BMI:  Body mass index is 23.08 kg/m.  Estimated Nutritional Needs:  Kcal:  7564-3329 kcals Protein:  85-100 grams Fluid:  >/= 2.1L    Shelle Iron RD, LDN For contact information, refer to [facility].

## 2023-02-26 NOTE — Progress Notes (Signed)
OT Cancellation Note  Patient Details Name: [name] MRN: [id number] DOB: January 24, 1951   Cancelled Treatment:    Reason Eval/Treat Not Completed: Patient's level of consciousness.  Spoke with RN, asked to hold for today.  Continue efforts as appropriate.    Jazma Pickel D Tarron Krolak 02/26/2023, 8:11 AM 02/26/2023  RP, OTR/L  [facility]  Office:  [phone]

## 2023-02-26 NOTE — H&P (Addendum)
History and Physical  [name] ZOX:[id number] DOB: 1951-07-18 DOA: 02/25/2023  Referring physician: Dr. Franciso Bend, Resident-EDP  PCP: Eden Emms, NP  Outpatient Specialists: GI. Patient coming from: Home.  Chief Complaint: Confusion  HPI: [name] is a 72 y.o. female with medical history significant for prior colon cancer diagnosed in 2003 status post partial colectomy and chemotherapy, nonalcoholic decompensated cirrhosis status post TIPS placement at least 10 years ago, who presented to [facility] ED from home due to confusion for the past 2 days.    History is mainly obtained from her spouse at bedside who reports that after the patient fell on 01/29/2023 and broke her left wrist, she started taking opioid-based analgesics for pain which precipitated constipation.  In turn, her PCP/hepatologist increased her home lactulose dose from 45cc 3 times daily to 60cc 3 times daily.    The patient was not able to have at least 2 bowel movements per day despite the increase in lactulose dose.  Has been incontinent of urine and stools.  Her husband believes that may have caused a urinary tract infection.    For the past 2 days the patient has been more altered with nausea and vomiting.  Missed her lactulose dose today.  He noted her body temperature was low today 94 at home.  States when she gets an UTI instead of having hyperthermia she has hypothermia.  He was concerned about the possibility of having a recurrent UTI and brought her into the ED for further evaluation.  In the ED, initially the patient was lethargic and hypothermic.  Bair hugger was applied and her hypothermia improved from 93.7 to 97.8.  The patient became more alert and answered to questions appropriately.  Per husband at bedside mentation is improving.  UA positive for pyuria.  Ammonia was 163.  The patient was started on Rocephin empirically and received 500 cc LR bolus x 1.  TRH, hospitalist service, was asked to  admit.  ED Course: Tmax 97.7.  BP 98/53, pulse 81, respiratory 16, O2 saturation 93% on room air.  Lab studies remarkable for serum potassium 3.4 glucose 101, BUN 38, creatinine 1.13 from baseline creatinine of 0.65, alkaline phosphatase 274, albumin 3.1, AST 85, ALT 58, ammonia 163, T bilirubin 2.1, GFR 52.  WBC 4.1 hemoglobin 12.1, platelet count 142.  Review of Systems: Review of systems as noted in the HPI. All other systems reviewed and are negative.   Past Medical History:  Diagnosis Date   Acute urinary retention 05/04/2022   Allergy 2006 ?   Contrast dye   Arthritis 2016 ??   Knees and thumb   Cancer    cecum   Cataract 2021   Surgery scheduled July 2023   Colon cancer 2003   Elevated liver function tests    Esophageal varices    Heart murmur On file   Hemorrhage of gastrointestinal tract 05/04/2011   Hypertension 2021   Hypothyroidism    Iron deficiency anemia    Liver disease    chemotherapy complication, per pt, shunts placed to bypass liver   Malignant neoplasm of cecum    Portal hypertension    Skin cancer 2019   Splenomegaly    Past Surgical History:  Procedure Laterality Date   COLON SURGERY  2004   Cancer   COSMETIC SURGERY  2021   Skin cancer   ESOPHAGEAL VARICE LIGATION     EYE SURGERY     HEMICOLECTOMY  01/08/2003   IR RADIOLOGIST EVAL &  MGMT  12/20/2020   IR RADIOLOGIST EVAL & MGMT  05/29/2021   LIVER SURGERY     shunts placed after chemo complication   SKIN FULL THICKNESS GRAFT N/A 09/12/2019   Procedure: debridement and FTSG to the nose from left upper arm;  Surgeon: Allena NapoleonPace, Collier S, MD;  Location: [facility];  Service: Plastics;  Laterality: N/A;  2 hours, please   TIPS PROCEDURE      Social History:  reports that she has never smoked. She has never used smokeless tobacco. She reports that she does not drink alcohol and does not use drugs.   Allergies  Allergen Reactions   Contrast Media [Iodinated Contrast Media] Hives     Family History  Problem Relation Age of Onset   Arthritis Mother    Hearing loss Mother    Heart disease Mother    Hypertension Mother    Miscarriages / [place] Mother    Arthritis Father    Diabetes Father    Heart disease Father    Cancer Maternal Aunt    Breast cancer Neg Hx    Colon cancer Neg Hx    Esophageal cancer Neg Hx    Pancreatic cancer Neg Hx    Stomach cancer Neg Hx       Prior to Admission medications   Medication Sig Start Date End Date Taking? Authorizing Provider  acidophilus (RISAQUAD) CAPS capsule Take 1 capsule by mouth daily.    [provider]  albuterol (VENTOLIN HFA) 108 (90 Base) MCG/ACT inhaler Inhale 1-2 puffs into the lungs every 6 (six) hours as needed for wheezing or shortness of breath. 11/14/22   Tomi BambergerMyers, Rebecca F, PA-C  Ascorbic Acid (VITAMIN C PO) Take 2,000 mg by mouth daily.    [provider]  AYR SALINE NASAL NA Place 1 spray into the nose daily as needed (congestion).    [provider]  Cholecalciferol (VITAMIN D3) 50 MCG (2000 UT) TABS Take 2,000 Units by mouth at bedtime.     [provider]  Cranberry 50 MG CHEW Chew 50 mg by mouth 2 (two) times daily.    [provider]  doxycycline (VIBRAMYCIN) 50 MG capsule Take 50 mg by mouth daily. 12/17/22   [provider]  ELDERBERRY PO Take 15-30 mLs by mouth See admin instructions. Take 15 ml in the morning and 30 ml at night    [provider]  estradiol (ESTRACE) 0.1 MG/GM vaginal cream Place vaginally every other day. 11/09/22   [provider]  furosemide (LASIX) 20 MG tablet Take 2 tablets (40 mg total) by mouth daily. Hold until you see your liver doctor 05/05/22 05/05/23  Arnetha CourserAmin, Sumayya, MD  lactulose (CHRONULAC) 10 GM/15ML solution Take 30 g by mouth 3 (three) times daily. 08/13/22   [provider]  lactulose, encephalopathy, (CHRONULAC) 10 GM/15ML SOLN Take 60 mLs by mouth 3 (three) times daily. 02/08/23  02/08/24  [provider]  levOCARNitine (CARNITOR) 330 MG tablet Take 330 mg by mouth 3 (three) times daily. 05/23/22   [provider]  Multiple Vitamin (MULTI-VITAMIN) tablet Take 1 tablet by mouth daily.    [provider]  nitrofurantoin, macrocrystal-monohydrate, (MACROBID) 100 MG capsule Take 1 capsule (100 mg total) by mouth 2 (two) times daily. 11/05/22   Joaquim Namuncan, Graham S, MD  spironolactone (ALDACTONE) 50 MG tablet Take 0.5 tablets (25 mg total) by mouth daily. 11/05/22 11/05/23  Joaquim Namuncan, Graham S, MD  SYNTHROID 75 MCG tablet Take 1 tablet (  75 mcg total) by mouth daily before breakfast. Must be seen in office for more refills. 12/03/22   Worthy Rancher B, FNP  zinc gluconate 50 MG tablet Take 50 mg by mouth 3 (three) times daily.    [provider]    Physical Exam: BP (!) 101/54 (BP Location: Right Arm)   Pulse 78   Temp (!) 97.2 F (36.2 C) (Rectal)   Resp 16   SpO2 94%   General: 72 y.o. year-old female well developed well nourished in no acute distress.  Alert and oriented x3. Cardiovascular: Regular rate and rhythm with no rubs or gallops.  No thyromegaly or JVD noted.  No lower extremity edema. 2/4 pulses in all 4 extremities. Respiratory: Clear to auscultation with no wheezes or rales. Good inspiratory effort. Abdomen: Soft nontender nondistended with normal bowel sounds x4 quadrants. Muskuloskeletal: No cyanosis, clubbing or edema noted bilaterally Neuro: CN II-XII intact, strength, sensation, reflexes Skin: No ulcerative lesions noted or rashes Psychiatry: Judgement and insight appear normal. Mood is appropriate for condition and setting          Labs on Admission:  Basic Metabolic Panel: Recent Labs  Lab 02/25/23 2025  NA 137  K 3.4*  CL 98  CO2 26  GLUCOSE 101*  BUN 38*  CREATININE 1.13*  CALCIUM 9.8   Liver Function Tests: Recent Labs  Lab 02/25/23 2025  AST 85*  ALT 58*  ALKPHOS 274*  BILITOT 2.1*  PROT 7.2   ALBUMIN 3.1*   No results for input(s): "LIPASE", "AMYLASE" in the last 168 hours. Recent Labs  Lab 02/25/23 2025  AMMONIA 163*   CBC: Recent Labs  Lab 02/25/23 2025  WBC 4.1  NEUTROABS 2.7  HGB 12.1  HCT 36.4  MCV 102.8*  PLT 142*   Cardiac Enzymes: No results for input(s): "CKTOTAL", "CKMB", "CKMBINDEX", "TROPONINI" in the last 168 hours.  BNP (last 3 results) No results for input(s): "BNP" in the last 8760 hours.  ProBNP (last 3 results) No results for input(s): "PROBNP" in the last 8760 hours.  CBG: No results for input(s): "GLUCAP" in the last 168 hours.  Radiological Exams on Admission: DG Abd 2 Views  Result Date: 02/25/2023 CLINICAL DATA:  abdominal tenderness, fecal leakage, hostory of constipatoin EXAM: ABDOMEN - 2 VIEW COMPARISON:  02/09/2023 FINDINGS: Normal abdominal gas pattern. Anastomotic staple line noted within the right mid abdomen. No free intraperitoneal gas. No organomegaly. Tips shunt noted. Angular calcifications within the right upper quadrant in keeping with gallstones. Osseous structures are age-appropriate. IMPRESSION: 1. Nonobstructive abdominal gas pattern. 2. Cholelithiasis. Electronically Signed   By: Helyn Numbers M.D.   On: 02/25/2023 21:49    EKG: I independently viewed the EKG done and my findings are as followed: None available at the time of this visit.  Assessment/Plan Present on Admission:  Acute hepatic encephalopathy  Principal Problem:   Acute hepatic encephalopathy  Acute hepatic encephalopathy Presented with lethargy, hyperammonemia level 163 Lactulose 40cc 3 times daily Goal bowel movement 2-3 soft stools per day Reorient as needed Avoid hepatotoxic agents Delirium precautions [facility], Dr. Myrtie Neither, consulted via epic secure chat.  Hypothermia secondary to UTI Monitor urine culture for ID and sensitivities Obtain peripheral blood cultures x 2 Continue Rocephin empirically Monitor fever curve and WBC Continue  Bair hugger until body temperature has normalized Maintain MAP greater than 65  AKI, suspect prerenal in the setting of poor oral intake, nausea and vomiting Nausea is controlled in the ED IV antiemetics as  needed Baseline creatinine 0.6 with GFR greater than 60 Presented with creatinine of 1.13 with GFR 52. IV albumin 25 g every 6 hours x 2 doses Avoid nephrotoxic agents, dehydration and hypotension. Repeat chemistry panel in the morning.  Hypotension Maintain MAP greater than 65 250 cc NS bolus given IV albumin 25 g every 6 hours x 2 doses Midodrine 10 mg x 1  Elevated liver chemistries in the setting of cirrhosis Alkaline phosphatase, AST, ALT, T bilirubin elevated. Monitor LFTs Avoid hepatotoxic agents  Hypokalemia Serum potassium 3.4 Repleted orally Repeat CMP and magnesium level in the morning  Cirrhosis with portal hypertension status post TIPS 15 years ago Decompensated liver cirrhosis diagnosed 2 years ago with portal hypertension and gastric varices  Follows with GI outpatient. MELD score 15, 6% estimated 3-month mortality  Coagulopathy INR 1.5 No reported overt bleeding Hold off pharmacological DVT prophylaxis  History of cecal cancer, diagnosed in 2003 Status post partial colectomy. Follows with GI outpatient  Hypothyroidism Resume home levothyroxine  Physical debility PT OT assessment Fall precautions   Critical care time: 65 minutes.   DVT prophylaxis: SCDs  Code Status: Full code as stated by the patient's husband at bedside.  Family Communication: Updated the patient's spouse at bedside.  Disposition Plan: Admitted to stepdown unit  Consults called: [facility]  Admission status: Inpatient status.   Status is: Inpatient The patient requires at least 2 midnight for further evaluation and treatment of present condition.   Darlin Drop MD [facility] Pager [phone]  If 7PM-7AM, please contact  night-coverage [email or web address] Password [facility]  02/26/2023, 12:39 AM

## 2023-02-26 NOTE — Consult Note (Addendum)
Consultation  Referring Provider: [facility] Primary Care Physician:  Eden Emms, NP Primary Gastroenterologist:  Dr. Orvan Falconer / [facility]  Reason for Consultation: Hepatic encephalopathy  HPI: [name] is a 72 y.o. female, known to Dr. Orvan Falconer, and last seen about a year ago with history of decompensated cirrhosis felt secondary to previous chemotherapy with oxaliplatin.  Patient had been diagnosed with colon cancer in 2003, underwent resection and then required adjuvant chemotherapy.  She developed cirrhosis and apparently underwent a TIPS procedure at [facility] in 2012 after she had a variceal hemorrhage.  At some point thereafter she developed issues with hepatic encephalopathy.  Revision of TIPS has decided against secondary to hepatic encephalopathy.  She is also been seen at [facility] hepatology, Annamarie Major, NP with last visit there in November 2023.  She had done well with oral lactulose per her husband.  Initially when she had been started on lactulose she was on 30 cc twice daily, and over time increased to 45 cc 3 times daily. She had been in her usual state of health until she fell and fractured her left wrist in early March.  At that time she had been given oxycodone for pain, developed constipation issues and then subsequent worsening of hepatic encephalopathy.  They have been struggling with the constipation over the past 3 to 4 weeks.  Lactulose had been increased to see 60 cc 3 times daily [facility] but according to her husband even with that she was still not having bowel movements, and just passing small oozing type stools.  They were then instructed to start taking MiraLAX but again no good bowel movements.  They came to the emergency room on 02/09/2023 did have an enema in the ER, and instructed to continue lactulose and MiraLAX. Patient's husband says that earlier this week had noticed intermittent confusion, she has had prior issues with urinary tract infections and  he felt that she was probably developing a urinary tract infection because of oozing of stool.  They had another abdominal film done as an outpatient on 02/24/2023 again showing a nonobstructive bowel gas pattern and multiple gallstones.  Eventually with worsening of the confusion last night she was brought to the emergency room. On admission venous ammonia 163 Labs today WBC 4.1/hemoglobin 12.1/platelets 142 Potassium 3.4/BUN 38/creatinine 1.13 LFTs with T. bili 2.1/alk phos 271/AST 85/ALT 58 Albumin 3.1 INR yesterday 1.5  UA was positive, urine culture pending, started on IV Rocephin.  Unfortunately overnight she has become more obtunded and is not verbal this morning.  She is also developed some mild hypotension with systolic blood pressure in the mid 90s.  has been afebrile.  She has received 1 lactulose enema and nurses working on the second lactulose enema.  She does have a Flexi-Seal in place and has a small amount of semiliquid stool in the tubing.  Per nursing no impaction noted prior to placement of the Flexi-Seal  Patient has not had an EGD for many years.  That was to be scheduled after she was seen by Dr. Orvan Falconer last year, but been says she had developed a urinary tract infection after that visit and then they did not reschedule.  Ultrasound had been done on 11/10/2022 showing cholelithiasis no gallbladder wall thickening CBD 3.7 mm, nodular coarsened echotexture of the liver, TIPS stent visualized color flow demonstrated within the TIPS however not specifically interrogated the portal vein patent, no ascites    Past Medical History:  Diagnosis Date  Acute urinary retention 05/04/2022   Allergy 2006 ?   Contrast dye   Arthritis 2016 ??   Knees and thumb   Cancer    cecum   Cataract 2021   Surgery scheduled July 2023   Colon cancer 2003   Elevated liver function tests    Esophageal varices    Heart murmur On file   Hemorrhage of gastrointestinal tract 05/04/2011    Hypertension 2021   Hypothyroidism    Iron deficiency anemia    Liver disease    chemotherapy complication, per pt, shunts placed to bypass liver   Malignant neoplasm of cecum    Portal hypertension    Skin cancer 2019   Splenomegaly     Past Surgical History:  Procedure Laterality Date   COLON SURGERY  2004   Cancer   COSMETIC SURGERY  2021   Skin cancer   ESOPHAGEAL VARICE LIGATION     EYE SURGERY     HEMICOLECTOMY  01/08/2003   IR RADIOLOGIST EVAL & MGMT  12/20/2020   IR RADIOLOGIST EVAL & MGMT  05/29/2021   LIVER SURGERY     shunts placed after chemo complication   SKIN FULL THICKNESS GRAFT N/A 09/12/2019   Procedure: debridement and FTSG to the nose from left upper arm;  Surgeon: Allena Napoleon, MD;  Location: [facility];  Service: Plastics;  Laterality: N/A;  2 hours, please   TIPS PROCEDURE      Prior to Admission medications   Medication Sig Start Date End Date Taking? Authorizing Provider  acidophilus (RISAQUAD) CAPS capsule Take 1 capsule by mouth daily.   Yes [provider]  Cranberry 50 MG CHEW Chew 50 mg by mouth 2 (two) times daily.   Yes [provider]  doxycycline (VIBRAMYCIN) 50 MG capsule Take 50 mg by mouth daily. 12/17/22  Yes [provider]  estradiol (ESTRACE) 0.1 MG/GM vaginal cream Place 1 Applicatorful vaginally every other day. 11/09/22  Yes [provider]  furosemide (LASIX) 20 MG tablet Take 2 tablets (40 mg total) by mouth daily. Hold until you see your liver doctor Patient taking differently: Take 40 mg by mouth daily. 05/05/22 05/05/23 Yes Arnetha Courser, MD  lactulose, encephalopathy, (CHRONULAC) 10 GM/15ML SOLN Take 60 mLs by mouth 3 (three) times daily. 02/08/23 02/08/24 Yes [provider]  levOCARNitine (CARNITOR) 330 MG tablet Take 330 mg by mouth 3 (three) times daily. 05/23/22  Yes [provider]  Multiple Vitamin (MULTI-VITAMIN) tablet Take 1 tablet by mouth daily.   Yes  [provider]  spironolactone (ALDACTONE) 50 MG tablet Take 0.5 tablets (25 mg total) by mouth daily. 11/05/22 11/05/23 Yes Joaquim Nam, MD  SYNTHROID 75 MCG tablet Take 1 tablet (75 mcg total) by mouth daily before breakfast. Must be seen in office for more refills. Patient taking differently: Take 75 mcg by mouth daily before breakfast. 12/03/22  Yes Worthy Rancher B, FNP  zinc gluconate 50 MG tablet Take 50 mg by mouth 3 (three) times daily.   Yes [provider]  albuterol (VENTOLIN HFA) 108 (90 Base) MCG/ACT inhaler Inhale 1-2 puffs into the lungs every 6 (six) hours as needed for wheezing or shortness of breath. Patient not taking: Reported on 02/26/2023 11/14/22   Tomi Bamberger, PA-C  nitrofurantoin, macrocrystal-monohydrate, (MACROBID) 100 MG capsule Take 1 capsule (100 mg total) by mouth 2 (two) times daily. Patient not taking: Reported on 02/26/2023 11/05/22   Joaquim Nam, MD  Current Facility-Administered Medications  Medication Dose Route Frequency Provider Last Rate Last Admin   cefTRIAXone (ROCEPHIN) 2 g in sodium chloride 0.9 % 100 mL IVPB  2 g Intravenous Q24H Hall, Carole N, DO       Chlorhexidine Gluconate Cloth 2 % PADS 6 each  6 each Topical Daily Hall, Carole N, DO       furosemide (LASIX) tablet 40 mg  40 mg Oral Daily Hall, Carole N, DO       lactulose (CHRONULAC) enema 200 gm  300 mL Rectal Once Meredeth Ide, MD       levothyroxine (SYNTHROID) tablet 75 mcg  75 mcg Oral Q0600 Darlin Drop, DO       Oral care mouth rinse  15 mL Mouth Rinse PRN Dow Adolph N, DO       polyethylene glycol (MIRALAX / GLYCOLAX) packet 17 g  17 g Oral Daily PRN Dow Adolph N, DO       prochlorperazine (COMPAZINE) injection 5 mg  5 mg Intravenous Q6H PRN Dow Adolph N, DO       spironolactone (ALDACTONE) tablet 25 mg  25 mg Oral Daily Dow Adolph N, DO        Allergies as of 02/25/2023 - Review Complete 02/25/2023  Allergen Reaction Noted   Contrast media  [iodinated contrast media] Hives 11/30/2011    Family History  Problem Relation Age of Onset   Arthritis Mother    Hearing loss Mother    Heart disease Mother    Hypertension Mother    Miscarriages / [place] Mother    Arthritis Father    Diabetes Father    Heart disease Father    Cancer Maternal Aunt    Breast cancer Neg Hx    Colon cancer Neg Hx    Esophageal cancer Neg Hx    Pancreatic cancer Neg Hx    Stomach cancer Neg Hx     Social History   Socioeconomic History   Marital status: Married    Spouse name: Animal nutritionist   Number of children: 2   Years of education: Not on file   Highest education level: Not on file  Occupational History   Occupation: Librarian  Tobacco Use   Smoking status: Never   Smokeless tobacco: Never   Tobacco comments:    Never smoked  Vaping Use   Vaping Use: Never used  Substance and Sexual Activity   Alcohol use: Never   Drug use: Never   Sexual activity: Not Currently    Partners: Male  Other Topics Concern   Not on file  Social History Narrative   12/04/20   From: the area   Living: with husband, [name] (1994)   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      Family: 2 children Designer, fashion/clothing and Optometrist - 2 grandchildren - nearby      Enjoys: read      Exercise: trying to get back to exercise   Diet: healthy, limits fast foods      Safety   Seat belts: Yes    Guns: Yes  and secure   Safe in relationships: Yes    Social Determinants of Health   Financial Resource Strain: Low Risk  (04/01/2022)   Overall Financial Resource Strain (CARDIA)    Difficulty of Paying Living Expenses: Not hard at all  Food Insecurity: No Food Insecurity (02/26/2023)   Hunger Vital Sign    Worried About Running Out of Food in the Last Year: Never  true    Ran Out of Food in the Last Year: Never true  Transportation Needs: No Transportation Needs (02/26/2023)   PRAPARE - Administrator, Civil ServiceTransportation    Lack of Transportation (Medical): No    Lack of Transportation  (Non-Medical): No  Physical Activity: Insufficiently Active (04/01/2022)   Exercise Vital Sign    Days of Exercise per Week: 2 days    Minutes of Exercise per Session: 20 min  Stress: No Stress Concern Present (04/01/2022)   [employer] - Occupational Stress Questionnaire    Feeling of Stress : Only a little  Social Connections: Moderately Integrated (04/01/2022)   Social Connection and Isolation Panel [NHANES]    Frequency of Communication with Friends and Family: More than three times a week    Frequency of Social Gatherings with Friends and Family: More than three times a week    Attends Religious Services: More than 4 times per year    Active Member of [employer] Financial[employer]ubs or Organizations: No    Attends BankerClub or Organization Meetings: Never    Marital Status: Married  Catering managerntimate Partner Violence: Not At Risk (02/26/2023)   Humiliation, Afraid, Rape, and Kick questionnaire    Fear of Current or Ex-Partner: No    Emotionally Abused: No    Physically Abused: No    Sexually Abused: No    Review of Systems: Patient unable to offer.   Physical Exam: Vital signs in last 24 hours: Temp:  [93.7 F (34.3 C)-98.3 F (36.8 C)] 98.3 F (36.8 C) (04/05 0730) Pulse Rate:  [62-84] 62 (04/05 1000) Resp:  [12-24] 13 (04/05 1000) BP: (80-124)/(25-68) 98/25 (04/05 1000) SpO2:  [91 %-100 %] 93 % (04/05 1000) Weight:  [61 kg] 61 kg (04/05 0031) Last BM Date : 02/26/23 General:  Alert, well-developed, acute and chronically ill-appearing older white female, obtunded does not open eyes when spoken to Head:  Normocephalic and atraumatic. Eyes:  Sclera clear, no icterus.  Conjunctiva pink. Ears:  Normal auditory acuity. Nose:  No deformity, discharge, or lesions. Mouth:  No deformity or lesions.   Neck:  Supple; no masses or thyromegaly. Lungs:  Clear throughout to auscultation.   No wheezes, crackles, or rhonchi.  Heart:  Regular rate and rhythm; no murmurs, clicks, rubs,  or  gallops. Abdomen:  Soft,nontender, BS active,nonpalp mass or hsm.  No fluid wave Rectal: Not done. Semi-liquid brown stool in Flexi-Seal tubing Msk:  Symmetrical without gross deformities. . Pulses:  Normal pulses noted. Extremities:  Without clubbing or edema. Neurologic: Obtunded, unresponsive Skin:  Intact without significant lesions or rashes.. Psych: Obtunded  Intake/Output from previous day: 04/04 0701 - 04/05 0700 In: 500 [IV Piggyback:500] Out: -  Intake/Output this shift: Total I/O In: 712 [IV Piggyback:712] Out: -   Lab Results: Recent Labs    02/25/23 2025 02/26/23 0214  WBC 4.1 3.6*  HGB 12.1 10.6*  HCT 36.4 32.1*  PLT 142* 109*   BMET Recent Labs    02/25/23 2025 02/26/23 0214  NA 137 139  K 3.4* 3.4*  CL 98 104  CO2 26 25  GLUCOSE 101* 63*  BUN 38* 33*  CREATININE 1.13* 0.98  CALCIUM 9.8 8.9   LFT Recent Labs    02/26/23 0214  PROT 5.9*  ALBUMIN 2.5*  AST 70*  ALT 46*  ALKPHOS 227*  BILITOT 1.6*   PT/INR Recent Labs    02/25/23 2025  LABPROT 17.5*  INR 1.5*    IMPRESSION:  #691 72 year old white female with history  of decompensated cirrhosis secondary to remote chemotherapy for colon cancer initially diagnosed in 2003. Patient is status post TIPS 2012 after a variceal hemorrhage. She has had issues with hepatic encephalopathy, currently managed with lactulose at home.  Unfortunately Xifaxan not obtainable due to cost.  Admitted last p.m. with worsening confusion at home.  Current issues started 3-1/2 weeks ago after she sustained a left wrist fracture and was placed on narcotics for pain control which resulted in constipation has been a struggle ever since. Per her husband despite increasing lactulose to 60 cc 3 times daily, and using MiraLAX at home she really has not had any good bowel movements over the past 2-1/2 to 3 weeks, and has had some intermittent vomiting.  Initial venous ammonia 163, patient has had further decline in  mental status overnight and is currently obtunded, grade 4 encephalopathy  Current MELD NA= 15  #2 UTI complicating above and likely contributing to worsening encephalopathy as well #3 mild hypotension today-midodrine started #4 history of esophageal varices/remote 2012 no recent EGD #5 history of adenocarcinoma of the colon status postresection 2003 and required adjuvant chemotherapy #6 Mild anemia #7 elevated LFTs, patient has had chronic mild elevation of LFTs, bit higher than baseline currently question reactive #8 cholelithiasis  PLAN: N.P.O. until encephalopathy significantly clears Expanded to 4 meds and institution of lactulose Lactulose per tube 30 g 4 times daily Continue lactulose enemas every 8 hours MiraLAX 17 g per tube once daily Continue midodrine TID Hold Lasix and Aldactone with soft blood pressure Add Zinc when able to take p.o.'s 200 mg twice daily Will schedule for upper abdominal ultrasound with Doppler, reassess TIPS Continue IV Rocephin GI will follow with you  Amy EsterwoodPA-C  02/26/2023, 11:12 AM    Attending Physician Note   I have taken a history, reviewed the chart and examined the patient. I performed a substantive portion of this encounter, including complete performance of at least one of the key components, in conjunction with the APP. I agree with the APP's note, impression and recommendations with my edits. My additional impressions and recommendations are as follows.   *Decompensated cirrhosis secondary to remote chemotherapy. S/P TIPS in 2012 for variceal bleeding. Chronic HE has worsened with constipation and UTI. She is currently obtunded - grade 4 encephalopathy. MELD Na=15. Lactulose by tube and by enema, Miralax by tube, Rocephin IV, midodrine. Hold Lasix and Aldactone for now. Schedule abd [place] with doppler to assess TIPS.   *History of colon cancer S/P resection in 2003 with adjuvant chemotherapy  *UTI - per primary service  Claudette Head, MD [facility] See AMION, [facility], for our on call provider

## 2023-02-27 ENCOUNTER — Inpatient Hospital Stay (HOSPITAL_COMMUNITY): Payer: Medicare Other

## 2023-02-27 DIAGNOSIS — D649 Anemia, unspecified: Secondary | ICD-10-CM | POA: Diagnosis not present

## 2023-02-27 DIAGNOSIS — K7460 Unspecified cirrhosis of liver: Secondary | ICD-10-CM | POA: Diagnosis not present

## 2023-02-27 DIAGNOSIS — I959 Hypotension, unspecified: Secondary | ICD-10-CM | POA: Diagnosis not present

## 2023-02-27 DIAGNOSIS — K7682 Hepatic encephalopathy: Secondary | ICD-10-CM | POA: Diagnosis not present

## 2023-02-27 LAB — GLUCOSE, CAPILLARY
Glucose-Capillary: 105 mg/dL — ABNORMAL HIGH (ref 70–99)
Glucose-Capillary: 85 mg/dL (ref 70–99)
Glucose-Capillary: 89 mg/dL (ref 70–99)
Glucose-Capillary: 97 mg/dL (ref 70–99)
Glucose-Capillary: 101 mg/dL — ABNORMAL HIGH (ref 70–99)
Glucose-Capillary: 114 mg/dL — ABNORMAL HIGH (ref 70–99)

## 2023-02-27 LAB — COMPREHENSIVE METABOLIC PANEL
ALT: 36 U/L (ref 0–44)
AST: 53 U/L — ABNORMAL HIGH (ref 15–41)
Albumin: 3.3 g/dL — ABNORMAL LOW (ref 3.5–5.0)
Alkaline Phosphatase: 184 U/L — ABNORMAL HIGH (ref 38–126)
Anion gap: 7 (ref 5–15)
BUN: 24 mg/dL — ABNORMAL HIGH (ref 8–23)
CO2: 25 mmol/L (ref 22–32)
Calcium: 9.1 mg/dL (ref 8.9–10.3)
Chloride: 112 mmol/L — ABNORMAL HIGH (ref 98–111)
Creatinine, Ser: 1 mg/dL (ref 0.44–1.00)
GFR, Estimated: >60 mL/min (ref >60)
Glucose, Bld: 97 mg/dL (ref 70–99)
Potassium: 2.9 mmol/L — ABNORMAL LOW (ref 3.5–5.1)
Sodium: 144 mmol/L (ref 135–145)
Total Bilirubin: 1.4 mg/dL — ABNORMAL HIGH (ref 0.3–1.2)
Total Protein: 6.1 g/dL — ABNORMAL LOW (ref 6.5–8.1)

## 2023-02-27 LAB — CBC WITH DIFFERENTIAL/PLATELET
Abs Immature Granulocytes: 0.01 10*3/uL (ref 0.00–0.07)
Basophils Absolute: 0 10*3/uL (ref 0.0–0.1)
Basophils Relative: 1 %
Eosinophils Absolute: 0 10*3/uL (ref 0.0–0.5)
Eosinophils Relative: 1 %
HCT: 28.5 % — ABNORMAL LOW (ref 36.0–46.0)
Hemoglobin: 9.2 g/dL — ABNORMAL LOW (ref 12.0–15.0)
Immature Granulocytes: 0 %
Lymphocytes Relative: 27 %
Lymphs Abs: 0.8 10*3/uL (ref 0.7–4.0)
MCH: 34.5 pg — ABNORMAL HIGH (ref 26.0–34.0)
MCHC: 32.3 g/dL (ref 30.0–36.0)
MCV: 106.7 fL — ABNORMAL HIGH (ref 80.0–100.0)
Monocytes Absolute: 0.5 10*3/uL (ref 0.1–1.0)
Monocytes Relative: 17 %
Neutro Abs: 1.6 10*3/uL — ABNORMAL LOW (ref 1.7–7.7)
Neutrophils Relative %: 54 %
Platelets: 101 10*3/uL — ABNORMAL LOW (ref 150–400)
RBC: 2.67 MIL/uL — ABNORMAL LOW (ref 3.87–5.11)
RDW: 19.4 % — ABNORMAL HIGH (ref 11.5–15.5)
WBC: 3 10*3/uL — ABNORMAL LOW (ref 4.0–10.5)
nRBC: 0 % (ref 0.0–0.2)

## 2023-02-27 LAB — MAGNESIUM: Magnesium: 2 mg/dL (ref 1.7–2.4)

## 2023-02-27 LAB — PROTIME-INR
INR: 1.9 — ABNORMAL HIGH (ref 0.8–1.2)
Prothrombin Time: 21.3 seconds — ABNORMAL HIGH (ref 11.4–15.2)

## 2023-02-27 LAB — CULTURE, BLOOD (ROUTINE X 2): Culture: NO GROWTH

## 2023-02-27 LAB — AMMONIA: Ammonia: 54 umol/L — ABNORMAL HIGH (ref 9–35)

## 2023-02-27 MED ORDER — ALBUMIN HUMAN 25 % IV SOLN
50.0000 g | Freq: Once | INTRAVENOUS | Status: AC
  Administered 2023-02-27: 50 g via INTRAVENOUS
  Filled 2023-02-27: qty 200

## 2023-02-27 MED ORDER — POTASSIUM CHLORIDE 2 MEQ/ML IV SOLN: INTRAVENOUS | Status: DC

## 2023-02-27 MED ORDER — ORAL CARE MOUTH RINSE
15.0000 mL | OROMUCOSAL | Status: DC
  Administered 2023-02-27 – 2023-03-12 (×37): 15 mL via OROMUCOSAL

## 2023-02-27 MED ORDER — MIDODRINE HCL 5 MG PO TABS: 5.0000 mg | ORAL_TABLET | Freq: Three times a day (TID) | ORAL | Status: DC

## 2023-02-27 MED ORDER — POTASSIUM CHLORIDE 10 MEQ/100ML IV SOLN
10.0000 meq | INTRAVENOUS | Status: DC
  Administered 2023-02-27 (×3): 10 meq via INTRAVENOUS
  Filled 2023-02-27 (×5): qty 100

## 2023-02-27 MED ORDER — ORAL CARE MOUTH RINSE: 15.0000 mL | OROMUCOSAL | Status: DC | PRN

## 2023-02-27 MED ORDER — ALBUMIN HUMAN 25 % IV SOLN
50.0000 g | Freq: Four times a day (QID) | INTRAVENOUS | Status: AC
  Administered 2023-02-27 (×2): 50 g via INTRAVENOUS
  Filled 2023-02-27 (×2): qty 200

## 2023-02-27 MED ORDER — POTASSIUM CHLORIDE 20 MEQ PO PACK
40.0000 meq | PACK | Freq: Once | ORAL | Status: AC
  Administered 2023-02-27: 40 meq
  Filled 2023-02-27: qty 2

## 2023-02-27 MED ORDER — POTASSIUM CHLORIDE CRYS ER 20 MEQ PO TBCR: 40.0000 meq | EXTENDED_RELEASE_TABLET | Freq: Once | ORAL | Status: DC

## 2023-02-27 MED ORDER — KCL IN DEXTROSE-NACL 40-5-0.9 MEQ/L-%-% IV SOLN
INTRAVENOUS | Status: DC
  Filled 2023-02-27 (×3): qty 1000

## 2023-02-27 NOTE — Progress Notes (Addendum)
Progress Note   Assessment    Decompensated cirrhosis secondary to remote chemotherapy. S/P TIPS in 2012 for variceal bleeding. Chronic HE worsened with constipation and ? UTI. She remains obtunded however slightly improved compared to yesterday. Ammonia 54 today. MELD 3.0=13. [place] with doppler shows a patent TIPS without stenosis, without PV thrombus  Recommendations   Continue lactulose by enteral tube and by enema, Miralax by enteral tube, Rocephin IV and midodrine. Continue to hold Lasix and Aldactone  Trend CBC, CMP, PT/INR qd for now   Chief Complaint   Remains obtunded however slightly improved compared to yesterday. Husband at bedside  Vital signs in last 24 hours: Temp:  [96.3 F (35.7 C)-99.7 F (37.6 C)] 99.1 F (37.3 C) (04/06 0750) Pulse Rate:  [57-99] 71 (04/06 0930) Resp:  [10-20] 15 (04/06 0930) BP: (74-117)/(25-48) 98/40 (04/06 0930) SpO2:  [91 %-96 %] 93 % (04/06 0930) Last BM Date : 02/26/23  General: Well-developed, acute and chronically ill-appearing Heart:  Regular rate and rhythm; no murmurs Chest: Clear to ascultation bilaterally Abdomen:  Soft, nontender and nondistended. Normal bowel sounds, without guarding, and without rebound.   Extremities:  Without edema. Neurologic:  Obtunded, opens eyes briefly and takes deep breaths when asked   Intake/Output from previous day: 04/05 0701 - 04/06 0700 In: 2041.9 [I.V.:216.4; NG/GT:200; IV Piggyback:715.5] Out: 1342 [Urine:725; Stool:617] Intake/Output this shift: Total I/O In: 1161.6 [I.V.:762.4; IV Piggyback:399.2] Out: -   Lab Results: Recent Labs    02/25/23 2025 02/26/23 0214 02/27/23 0254  WBC 4.1 3.6* 3.0*  HGB 12.1 10.6* 9.2*  HCT 36.4 32.1* 28.5*  PLT 142* 109* 101*   BMET Recent Labs    02/25/23 2025 02/26/23 0214 02/27/23 0254  NA 137 139 144  K 3.4* 3.4* 2.9*  CL 98 104 112*  CO2 26 25 25   GLUCOSE 101* 63* 97  BUN 38* 33* 24*  CREATININE 1.13* 0.98 1.00  CALCIUM 9.8  8.9 9.1   LFT Recent Labs    02/27/23 0254  PROT 6.1*  ALBUMIN 3.3*  AST 53*  ALT 36  ALKPHOS 184*  BILITOT 1.4*   PT/INR Recent Labs    02/25/23 2025  LABPROT 17.5*  INR 1.5*    Studies/Results: US ABDOMEN LIMITED WITH LIVER DOPPLER  Result Date: 02/26/2023 CLINICAL DATA:  History of cirrhosis with prior TIPS placement at [facility] in 2012 due to variceal hemorrhage. Hepatic encephalopathy. EXAM: DUPLEX ULTRASOUND OF LIVER AND TIPS SHUNT TECHNIQUE: Color and duplex Doppler ultrasound was performed to evaluate the hepatic in-flow and out-flow vessels. COMPARISON:  PRIOR DUPLEX ULTRASOUND ON 05/29/2021 FINDINGS: Portal Vein Velocities Main:  86 cm/sec Right:  21 cm/sec Left:  32 cm/sec TIPS Stent Velocities Proximal:  116 cm/sec Mid:  109 Distal:  71 cm/sec IVC: Present and patent with normal respiratory phasicity. Hepatic Vein Velocities Right:  47 cm/sec Mid:  48 cm/sec Left:  45 cm/sec Splenic Vein: 28 cm/sec Superior Mesenteric Vein: 54 cm/sec Hepatic Artery: 136 cm/sec Ascities: Absent Varices: Absent Other findings: TIPS shunt is normally patent without evidence to suggest thrombus or stenosis. No evidence of portal vein thrombus. IMPRESSION: Normally patent TIPS shunt. No evidence of portal vein thrombus or TIPS stenosis. Electronically Signed   By: Irish Lack M.D.   On: 02/26/2023 15:07   DG Abd 1 View  Result Date: 02/26/2023 CLINICAL DATA:  Feeding tube placement EXAM: ABDOMEN - 1 VIEW COMPARISON:  02/24/2023 FINDINGS: Bowel gas pattern is nonspecific. Moderate amount of stool is seen  in colon. Tip of NG tube is seen in the fundus of the stomach. Distal portion of NG tube is coiled with its tip pointing cephalad. There is a vascular stent overlying the liver IMPRESSION: Tip of NG tube is seen in the fundus of the stomach. Distal course of NG tube is coiled within stomach with its tip pointing cephalad. Electronically Signed   By: Ernie Avena M.D.   On: 02/26/2023 15:00       LOS: 2 days   Aayliah Rotenberry T. Russella Dar, MD 02/27/2023, 11:00 AM See Loretha Stapler, [facility], to contact our on call provider

## 2023-02-27 NOTE — Progress Notes (Signed)
PT Cancellation Note  Patient Details Name: Taylon Mustapha MRN: [id number] DOB: Sep 16, 1951   Cancelled Treatment:     PT order received but eval deferred at request of RN - pt very confused and not following commands.  Will follow.    Treniya Lobb 02/27/2023, 2:47 PM

## 2023-02-27 NOTE — Progress Notes (Signed)
PROGRESS NOTE    [name]  UUV:[id number] DOB: [birth year] DOA: 02/25/2023 PCP: Eden Emms, NP   Brief Narrative:  72 year old female with history of colon cancer thousand +3, status post partial colectomy and chemotherapy, history of liver cirrhosis felt secondary to previous chemotherapy with oxaliplatin, s/p TIPS procedure at [facility] in 2012, hepatic encephalopathy, came to the hospital with confusion for past 2 days. As per patient has been patient broke her left wrist in March at that time she was started on opioid-based analgesics which caused constipation.  Patient's dose of lactulose was increased from 45 cc 3 times a day to 60 cc 3 times a day. Despite this patient was not having bowel movements, she has been incontinent of urine and stools. Patient has been more altered with nausea vomiting for past 2 days.  Missed dose of lactulose.  Was found to have temperature of 94 F at home. In the ED she was found to be lethargic and hypothermic.  Bair hugger was applied and hypothermia resolved. UA was positive for pyuria, ammonia was elevated at 163.    Assessment & Plan:   Principal Problem:   Acute hepatic encephalopathy Active Problems:   Protein-calorie malnutrition, severe  Acute hepatic encephalopathy -Ammonia level elevated at 163 on admission but it has now improved to 54.  Patient has improved somewhat, she opens eyes intermittently to the verbal command. -Gastroenterology has started lactulose enema 200 g every 8 hours and she is also getting lactulose through NG tube and we will continue all of those and monitor and repeat ammonia in the morning.   Hypoglycemia -Due to poor p.o. intake, patient is obtunded, she is on dextrose 10%, I will switch her to dextrose and normal saline with potassium due to hypokalemia.   Hypothermia: This was presumed to be secondary to UTI however upon my personal review, I am not impressed with UA and urine culture is negative as  well so I do not think this is UTI, this could very well be due to acute encephalopathy however hypothermia has resolved.   Acute kidney injury: Improving.  Continue IV fluids.  Hypotension: She did have hypotension once again, she was given improvement again.  She is on midodrine which we will continue.   Liver cirrhosis with portal hypertension s/p TIPS procedure in 2012 -Developed liver cirrhosis after chemotherapy with oxaliplatin -Followed by gastroenterology -Follow LFTs in a.m. -MELD score 15, 6% estimated 50-month mortality    UTI ruled out: UA not impressive and urine culture negative.  Will discontinue Rocephin.  Pancytopenia: Likely chemotherapy-induced.  Stable.  DVT prophylaxis: SCDs Start: 02/26/23 0035   Code Status: Full Code  Family Communication: Husband present at bedside.  Plan of care discussed with patient in length and he/she verbalized understanding and agreed with it.  Status is: Inpatient Remains inpatient appropriate because: Still encephalopathic.   Estimated body mass index is 23.08 kg/m as calculated from the following:   Height as of this encounter: 5\' 4"  (1.626 m).   Weight as of this encounter: 61 kg.    Nutritional Assessment: Body mass index is 23.08 kg/m.Marland Kitchen Seen by dietician.  I agree with the assessment and plan as outlined below: Nutrition Status: Nutrition Problem: Severe Malnutrition Etiology: acute illness Signs/Symptoms: moderate fat depletion, moderate muscle depletion, energy intake < or equal to 50% for > or equal to 5 days, percent weight loss (14.6% in 3 weeks) Percent weight loss: 14.6 % (in 3 weeks) Interventions: Refer to RD note  for recommendations  . Skin Assessment: I have examined the patient's skin and I agree with the wound assessment as performed by the wound care RN as outlined below:    Consultants:  GI  Procedures:  None  Antimicrobials:  Anti-infectives (From admission, onward)    Start     Dose/Rate  Route Frequency Ordered Stop   02/26/23 2200  cefTRIAXone (ROCEPHIN) 2 g in sodium chloride 0.9 % 100 mL IVPB        2 g 200 mL/hr over 30 Minutes Intravenous Every 24 hours 02/26/23 0034     02/25/23 2200  cefTRIAXone (ROCEPHIN) 1 g in sodium chloride 0.9 % 100 mL IVPB  Status:  Discontinued        1 g 200 mL/hr over 30 Minutes Intravenous Every 24 hours 02/25/23 2118 02/26/23 0034         Subjective: Patient seen and examined.  Husband at bedside.  Patient is still obtunded but opens eyes to verbal commands intermittently.  Overall better than yesterday and improving.  Objective: Vitals:   02/27/23 0800 02/27/23 0830 02/27/23 0900 02/27/23 0930  BP: (!) 105/45 (!) 106/39 (!) 108/46 (!) 98/40  Pulse: 85 88 92 71  Resp: Temp:      TempSrc:      SpO2: 94% 93% 93% 93%  Weight:      Height:        Intake/Output Summary (Last 24 hours) at 02/27/2023 0949 Last data filed at 02/27/2023 0946 Gross per 24 hour  Intake 3203.51 ml  Output 1342 ml  Net 1861.51 ml   Filed Weights   02/26/23 0031  Weight: 61 kg    Examination:  General exam: Appears calm and comfortable  Respiratory system: Clear to auscultation. Respiratory effort normal. Cardiovascular system: S1 & S2 heard, RRR. No JVD, murmurs, rubs, gallops or clicks. No pedal edema. Gastrointestinal system: Abdomen is nondistended, soft and nontender. No organomegaly or masses felt. Normal bowel sounds heard. Central nervous system: Almost obtunded, disoriented.  Not following commands.   Data Reviewed: I have personally reviewed following labs and imaging studies  CBC: Recent Labs  Lab 02/25/23 2025 02/26/23 0214 02/27/23 0254  WBC 4.1 3.6* 3.0*  NEUTROABS 2.7  --  1.6*  HGB 12.1 10.6* 9.2*  HCT 36.4 32.1* 28.5*  MCV 102.8* 105.9* 106.7*  PLT 142* 109* 101*   Basic Metabolic Panel: Recent Labs  Lab 02/25/23 2025 02/26/23 0214 02/27/23 0254 02/27/23 0343  NA 137 139 144  --   K 3.4* 3.4* 2.9*   --   CL 98 104 112*  --   CO2 --   GLUCOSE 101* 63* 97  --   BUN 38* 33* 24*  --   CREATININE 1.13* 0.98 1.00  --   CALCIUM 9.8 8.9 9.1  --   MG  --  1.7  --  2.0  PHOS  --  3.4  --   --    GFR: Estimated Creatinine Clearance: 44.6 mL/min (by C-G formula based on SCr of 1 mg/dL). Liver Function Tests: Recent Labs  Lab 02/25/23 2025 02/26/23 0214 02/27/23 0254  AST 85* 70* 53*  ALT 58* 46* 36  ALKPHOS 274* 227* 184*  BILITOT 2.1* 1.6* 1.4*  PROT 7.2 5.9* 6.1*  ALBUMIN 3.1* 2.5* 3.3*   No results for input(s): "LIPASE", "AMYLASE" in the last 168 hours. Recent Labs  Lab 02/25/23 2025 02/26/23 0948 02/27/23 0254  AMMONIA 163* 163*  54*   Coagulation Profile: Recent Labs  Lab 02/25/23 2025  INR 1.5*   Cardiac Enzymes: No results for input(s): "CKTOTAL", "CKMB", "CKMBINDEX", "TROPONINI" in the last 168 hours. BNP (last 3 results) No results for input(s): "PROBNP" in the last 8760 hours. HbA1C: No results for input(s): "HGBA1C" in the last 72 hours. CBG: Recent Labs  Lab 02/26/23 1611 02/26/23 1934 02/26/23 2317 02/27/23 0329 02/27/23 0741  GLUCAP 93 89 92 105* 85   Lipid Profile: No results for input(s): "CHOL", "HDL", "LDLCALC", "TRIG", "CHOLHDL", "LDLDIRECT" in the last 72 hours. Thyroid Function Tests: Recent Labs    02/25/23 2036  TSH 1.260   Anemia Panel: No results for input(s): "VITAMINB12", "FOLATE", "FERRITIN", "TIBC", "IRON", "RETICCTPCT" in the last 72 hours. Sepsis Labs: Recent Labs  Lab 02/26/23 0948  LATICACIDVEN 1.3    Recent Results (from the past 240 hour(s))  Urine Culture     Status: None   Collection Time: 02/25/23  8:06 PM   Specimen: Urine, Clean Catch  Result Value Ref Range Status   Specimen Description   Final    URINE, CLEAN CATCH Performed at [facility], [address] [place], [place] [address]    Special Requests   Final    NONE Performed at [facility], [address] [place], [place] [address]    Culture   Final    NO GROWTH Performed at [facility], [address] [place], [place] [address]    Report Status 02/26/2023 FINAL  Final  MRSA Next Gen by PCR, Nasal     Status: None   Collection Time: 02/26/23 12:16 AM   Specimen: Nasal Mucosa; Nasal Swab  Result Value Ref Range Status   MRSA by PCR Next Gen NOT DETECTED NOT DETECTED Final    Comment: (NOTE) The GeneXpert MRSA Assay (FDA approved for NASAL specimens only), is one component of a comprehensive MRSA colonization surveillance program. It is not intended to diagnose MRSA infection nor to guide or monitor treatment for MRSA infections. Test performance is not FDA approved in patients less than 72 years old. Performed at [facility], [address] [place], [place] [address]      Radiology Studies: US ABDOMEN LIMITED WITH LIVER DOPPLER  Result Date: 02/26/2023 CLINICAL DATA:  History of cirrhosis with prior TIPS [facility] in [facility] due to variceal hemorrhage. Hepatic encephalopathy. EXAM: DUPLEX ULTRASOUND OF LIVER AND TIPS SHUNT TECHNIQUE: Color and duplex Doppler ultrasound was performed to evaluate the hepatic in-flow and out-flow vessels. COMPARISON:  PRIOR DUPLEX ULTRASOUND ON 05/29/2021 FINDINGS: Portal Vein Velocities Main:  86 cm/sec Right:  21 cm/sec Left:  32 cm/sec TIPS Stent Velocities Proximal:  116 cm/sec Mid:  109 Distal:  71 cm/sec IVC: Present and patent with normal respiratory phasicity. Hepatic Vein Velocities Right:  47 cm/sec Mid:  48 cm/sec Left:  45 cm/sec Splenic Vein: 28 cm/sec Superior Mesenteric Vein: 54 cm/sec Hepatic Artery: 136 cm/sec Ascities: Absent Varices: Absent Other findings: TIPS shunt is normally patent without evidence to suggest thrombus or stenosis. No evidence of portal vein thrombus. IMPRESSION: Normally patent TIPS shunt. No evidence of portal vein thrombus or TIPS stenosis. Electronically Signed   By: Irish LackGlenn   Yamagata M.D.   On: 02/26/2023 15:07   DG Abd 1 View  Result Date: 02/26/2023 CLINICAL DATA:  Feeding tube placement EXAM: ABDOMEN - 1 VIEW COMPARISON:  02/24/2023 FINDINGS: Bowel gas pattern is nonspecific. Moderate amount of stool is  seen in colon. Tip of NG tube is seen in the fundus of the stomach. Distal portion of NG tube is coiled with its tip pointing cephalad. There is a vascular stent overlying the liver IMPRESSION: Tip of NG tube is seen in the fundus of the stomach. Distal course of NG tube is coiled within stomach with its tip pointing cephalad. Electronically Signed   By: Ernie Avena M.D.   On: 02/26/2023 15:00    Scheduled Meds:  Chlorhexidine Gluconate Cloth  6 each Topical Daily   lactulose  30 g Per Tube Q6H   lactulose  300 mL Rectal Q8H   levothyroxine  75 mcg Per Tube Q0600   midodrine  5 mg Per Tube TID WC   Continuous Infusions:  cefTRIAXone (ROCEPHIN)  IV Stopped (02/26/23 2156)   dextrose 5 % and 0.9 % NaCl with KCl 40 mEq/L 100 mL/hr at 02/27/23 0946     LOS: 2 days   Hughie Closs, MD [facility]  02/27/2023, 9:49 AM   *Please note that this is a verbal dictation therefore any spelling or grammatical errors are due to the "Dragon [employer] One" system interpretation.  Please page via Amion and do not message via secure chat for urgent patient care matters. Secure chat can be used for non urgent patient care matters.  How to contact the [facility] Attending or Consulting provider 7A - 7P or covering provider during after hours 7P -7A, for this patient?  Check the care team in [facility] and look for a) attending/consulting [facility] provider listed and b) the [facility] team listed. Page or secure chat 7A-7P. Log into [email or web address] and use [place]'s universal password to access. If you do not have the password, please contact the hospital operator. Locate the [facility] provider you are looking for under [facility] and page to a number that you can be directly  reached. If you still have difficulty reaching the provider, please page the [facility] (Director on Call) for the Hospitalists listed on amion for assistance.

## 2023-02-27 NOTE — Progress Notes (Signed)
    Patient Name: [name]           DOB: 03/06/1951  MRN: [id number]      Admission Date: 02/25/2023  Attending Provider: Meredeth Ide, MD  Primary Diagnosis: Acute hepatic encephalopathy   Level of care: Stepdown    CROSS COVER NOTE   Date of Service   02/27/2023   Aby Bessinger, 72 y.o. female, was admitted on 02/25/2023 for Acute hepatic encephalopathy.    HPI/Events of Note   Notified by bedside RN of BP 90/30s with MAP 50s.  During admission, patient was also hypotensive and required NS bolus, IV albumin, and midodrine.  Will order IV albumin.  BP goal is SBP> 90 with MAP> or equal 65.  RN to continue monitoring BP and for acute changes.  Interventions/ Plan   IV albumin        Anthoney Harada, DNP, ACNPC- AG [facility] [place]

## 2023-02-28 DIAGNOSIS — I959 Hypotension, unspecified: Secondary | ICD-10-CM | POA: Diagnosis not present

## 2023-02-28 DIAGNOSIS — D649 Anemia, unspecified: Secondary | ICD-10-CM | POA: Diagnosis not present

## 2023-02-28 DIAGNOSIS — K7682 Hepatic encephalopathy: Secondary | ICD-10-CM | POA: Diagnosis not present

## 2023-02-28 DIAGNOSIS — K7460 Unspecified cirrhosis of liver: Secondary | ICD-10-CM | POA: Diagnosis not present

## 2023-02-28 LAB — COMPREHENSIVE METABOLIC PANEL
ALT: 28 U/L (ref 0–44)
AST: 41 U/L (ref 15–41)
Albumin: 4.5 g/dL (ref 3.5–5.0)
Alkaline Phosphatase: 144 U/L — ABNORMAL HIGH (ref 38–126)
Anion gap: 5 (ref 5–15)
BUN: 15 mg/dL (ref 8–23)
CO2: 23 mmol/L (ref 22–32)
Calcium: 9.6 mg/dL (ref 8.9–10.3)
Chloride: 120 mmol/L — ABNORMAL HIGH (ref 98–111)
Creatinine, Ser: 0.72 mg/dL (ref 0.44–1.00)
GFR, Estimated: >60 mL/min (ref >60)
Glucose, Bld: 114 mg/dL — ABNORMAL HIGH (ref 70–99)
Potassium: 4.4 mmol/L (ref 3.5–5.1)
Sodium: 148 mmol/L — ABNORMAL HIGH (ref 135–145)
Total Bilirubin: 2.2 mg/dL — ABNORMAL HIGH (ref 0.3–1.2)
Total Protein: 6.9 g/dL (ref 6.5–8.1)

## 2023-02-28 LAB — CBC WITH DIFFERENTIAL/PLATELET
Abs Immature Granulocytes: 0.01 10*3/uL (ref 0.00–0.07)
Basophils Absolute: 0 10*3/uL (ref 0.0–0.1)
Basophils Relative: 1 %
Eosinophils Absolute: 0 10*3/uL (ref 0.0–0.5)
Eosinophils Relative: 2 %
HCT: 29.7 % — ABNORMAL LOW (ref 36.0–46.0)
Hemoglobin: 9.4 g/dL — ABNORMAL LOW (ref 12.0–15.0)
Immature Granulocytes: 0 %
Lymphocytes Relative: 28 %
Lymphs Abs: 0.6 10*3/uL — ABNORMAL LOW (ref 0.7–4.0)
MCH: 34.9 pg — ABNORMAL HIGH (ref 26.0–34.0)
MCHC: 31.6 g/dL (ref 30.0–36.0)
MCV: 110.4 fL — ABNORMAL HIGH (ref 80.0–100.0)
Monocytes Absolute: 0.3 10*3/uL (ref 0.1–1.0)
Monocytes Relative: 15 %
Neutro Abs: 1.3 10*3/uL — ABNORMAL LOW (ref 1.7–7.7)
Neutrophils Relative %: 54 %
Platelets: 78 10*3/uL — ABNORMAL LOW (ref 150–400)
RBC: 2.69 MIL/uL — ABNORMAL LOW (ref 3.87–5.11)
RDW: 19.8 % — ABNORMAL HIGH (ref 11.5–15.5)
WBC: 2.3 10*3/uL — ABNORMAL LOW (ref 4.0–10.5)
nRBC: 0 % (ref 0.0–0.2)

## 2023-02-28 LAB — PROTIME-INR
INR: 2.1 — ABNORMAL HIGH (ref 0.8–1.2)
Prothrombin Time: 23.4 seconds — ABNORMAL HIGH (ref 11.4–15.2)

## 2023-02-28 LAB — GLUCOSE, CAPILLARY
Glucose-Capillary: 100 mg/dL — ABNORMAL HIGH (ref 70–99)
Glucose-Capillary: 95 mg/dL (ref 70–99)
Glucose-Capillary: 96 mg/dL (ref 70–99)
Glucose-Capillary: 102 mg/dL — ABNORMAL HIGH (ref 70–99)
Glucose-Capillary: 100 mg/dL — ABNORMAL HIGH (ref 70–99)

## 2023-02-28 LAB — AMMONIA: Ammonia: 66 umol/L — ABNORMAL HIGH (ref 9–35)

## 2023-02-28 MED ORDER — LIP MEDEX EX OINT
1.0000 | TOPICAL_OINTMENT | CUTANEOUS | Status: DC | PRN
  Filled 2023-02-28: qty 7

## 2023-02-28 MED ORDER — DEXTROSE-NACL 5-0.9 % IV SOLN: INTRAVENOUS | Status: DC

## 2023-02-28 NOTE — Progress Notes (Signed)
PT Cancellation Note  Patient Details Name: [name] MRN: [id number] DOB: 1950-12-06   Cancelled Treatment:     PT deferred this date on advice of RN 2* pt continued confusion.  Will follow.   Gianni Fuchs 02/28/2023, 8:56 AM

## 2023-02-28 NOTE — Progress Notes (Addendum)
Attempted lactulose enema. After first push of 60cc, the liquid pooled between her legs. Her flexi-seal is not containing it well enough to continue the enema. Notified provider and enemas were discontinued.  Patient seems to have great pain when moving/opening legs for hygiene/peri-care. It is possible her hips were affected by her recent fall.

## 2023-02-28 NOTE — Progress Notes (Addendum)
Progress Note   Assessment    Decompensated cirrhosis. S/P TIPS in 2012 for variceal bleeding. Chronic HE worsened with constipation and ? UTI. She remains obtunded however slightly improved compared to yesterday. Ammonia 66. INR increasing. MELD 3.0=18. US with doppler shows a patent TIPS without stenosis, without PV thrombus History of cecal adenocarcinoma, S/P resection in 2004, adjuvant chemotherapy Cholelithiasis   Recommendations   Continue lactulose by enteral tube, Miralax by enteral tube, Rocephin IV and midodrine. Continue to hold Lasix and Aldactone. Unable to hold lactulose enemas so they were discontinued. Titrate lactulose dose to maintain ~3 loose BMs/day Trend CBC, CMP, PT/INR qd for now   Chief Complaint   Holds head, neck up today. Takes deep breaths when asked. Husband at beside  Vital signs in last 24 hours: Temp:  [96.3 F (35.7 C)-98.7 F (37.1 C)] 96.3 F (35.7 C) (04/07 0800) Pulse Rate:  [52-72] 60 (04/07 0700) Resp:  [13-25] 13 (04/07 0700) BP: (86-138)/(32-69) 133/62 (04/07 0700) SpO2:  [93 %-95 %] 95 % (04/07 0700) Last BM Date : 02/28/23  General: Well-developed, acute and chronically ill-appearing Heart:  Regular rate and rhythm; no murmurs Chest: Clear to ascultation bilaterally Abdomen:  Soft, nontender and nondistended. Normal bowel sounds, without guarding, and without rebound.   Extremities:  Without edema. Neurologic:  Obtunded however slightly improved, follows a few commands   Intake/Output from previous day: 04/06 0701 - 04/07 0700 In: 3512.2 [I.V.:2487.2; NG/GT:300; IV Piggyback:725] Out: 1890 [Urine:1200; Stool:690] Intake/Output this shift: Total I/O In: 222.3 [I.V.:222.3] Out: -   Lab Results: Recent Labs    02/26/23 0214 02/27/23 0254 02/28/23 0305  WBC 3.6* 3.0* 2.3*  HGB 10.6* 9.2* 9.4*  HCT 32.1* 28.5* 29.7*  PLT 109* 101* 78*   BMET Recent Labs    02/26/23 0214 02/27/23 0254 02/28/23 0305  NA 139  144 148*  K 3.4* 2.9* 4.4  CL 104 112* 120*  CO2 25 25 23   GLUCOSE 63* 97 114*  BUN 33* 24* 15  CREATININE 0.98 1.00 0.72  CALCIUM 8.9 9.1 9.6   LFT Recent Labs    02/28/23 0305  PROT 6.9  ALBUMIN 4.5  AST 41  ALT 28  ALKPHOS 144*  BILITOT 2.2*   PT/INR Recent Labs    02/27/23 1143 02/28/23 0305  LABPROT 21.3* 23.4*  INR 1.9* 2.1*    Studies/Results: DG Abd 1 View  Result Date: 02/27/2023 CLINICAL DATA:  NG tube placement EXAM: ABDOMEN - 1 VIEW COMPARISON:  02/26/2023 FINDINGS: Tip of feeding tube is seen in the fundus of the stomach. Tip of NG tube is pointing cephalad. There is interval withdrawal of NG tube with shorter length of NG tube within the stomach in the current study in comparison with the examination of 02/26/2023. IMPRESSION: Tip of feeding tube is seen in the fundus of the stomach. Electronically Signed   By: Ernie AvenaPalani  Rathinasamy M.D.   On: 02/27/2023 19:19   US ABDOMEN LIMITED WITH LIVER DOPPLER  Result Date: 02/26/2023 CLINICAL DATA:  History of cirrhosis with [facility] due to variceal hemorrhage. Hepatic encephalopathy. EXAM: DUPLEX ULTRASOUND OF LIVER AND TIPS SHUNT TECHNIQUE: Color and duplex Doppler ultrasound was performed to evaluate the hepatic in-flow and out-flow vessels. COMPARISON:  PRIOR DUPLEX ULTRASOUND ON 05/29/2021 FINDINGS: Portal Vein Velocities Main:  86 cm/sec Right:  21 cm/sec Left:  32 cm/sec TIPS Stent Velocities Proximal:  116 cm/sec Mid:  109 Distal:  71 cm/sec IVC: Present  and patent with normal respiratory phasicity. Hepatic Vein Velocities Right:  47 cm/sec Mid:  48 cm/sec Left:  45 cm/sec Splenic Vein: 28 cm/sec Superior Mesenteric Vein: 54 cm/sec Hepatic Artery: 136 cm/sec Ascities: Absent Varices: Absent Other findings: TIPS shunt is normally patent without evidence to suggest thrombus or stenosis. No evidence of portal vein thrombus. IMPRESSION: Normally patent TIPS shunt. No evidence of portal vein thrombus  or TIPS stenosis. Electronically Signed   By: Irish Lack M.D.   On: 02/26/2023 15:07   DG Abd 1 View  Result Date: 02/26/2023 CLINICAL DATA:  Feeding tube placement EXAM: ABDOMEN - 1 VIEW COMPARISON:  02/24/2023 FINDINGS: Bowel gas pattern is nonspecific. Moderate amount of stool is seen in colon. Tip of NG tube is seen in the fundus of the stomach. Distal portion of NG tube is coiled with its tip pointing cephalad. There is a vascular stent overlying the liver IMPRESSION: Tip of NG tube is seen in the fundus of the stomach. Distal course of NG tube is coiled within stomach with its tip pointing cephalad. Electronically Signed   By: Ernie Avena M.D.   On: 02/26/2023 15:00      LOS: 3 days   Koleman Marling T. Russella Dar, MD 02/28/2023, 10:18 AM See [facility], to contact our on call provider

## 2023-02-28 NOTE — Progress Notes (Signed)
PROGRESS NOTE    [name]  ZOX:[id number] DOB: 10/08/1951 DOA: 02/25/2023 PCP: Eden Emms, NP   Brief Narrative:  72 year old female with history of colon cancer thousand +3, status post partial colectomy and chemotherapy, history of liver cirrhosis felt secondary to previous chemotherapy with oxaliplatin, s/p TIPS procedure at [facility] in 2012, hepatic encephalopathy, came to the hospital with confusion for past 2 days. As per patient has been patient broke her left wrist in March at that time she was started on opioid-based analgesics which caused constipation.  Patient's dose of lactulose was increased from 45 cc 3 times a day to 60 cc 3 times a day. Despite this patient was not having bowel movements, she has been incontinent of urine and stools. Patient has been more altered with nausea vomiting for past 2 days.  Missed dose of lactulose.  Was found to have temperature of 94 F at home. In the ED she was found to be lethargic and hypothermic.  Bair hugger was applied and hypothermia resolved. UA was positive for pyuria, ammonia was elevated at 163.    Assessment & Plan:   Principal Problem:   Acute hepatic encephalopathy Active Problems:   Protein-calorie malnutrition, severe  Acute hepatic encephalopathy -Ammonia level elevated at 163 on admission but it has now improved to 54 yesterday but up slightly at 66 today but interestingly patient clinically has improved, she is now following commands somewhat although she cannot keep her eyes open.  Husband at the bedside is also happy with her improvement.  Reportedly, patient was unable to hold lactulose enema so those were discontinued last night however she continues to receive oral lactulose through tube.  GI is on board and managing.   Hypoglycemia -Due to poor p.o. intake, patient is still too lethargic to hold oral intake, she is on dextrose and normal saline with potassium due to hypokalemia yesterday.  Potassium is now  4.4 so I will switch her to dextrose with normal saline with no potassium.   Hypothermia: This was presumed to be secondary to UTI however upon my personal review, I am not impressed with UA and urine culture is negative as well so I do not think this is UTI, this could very well be due to acute encephalopathy however hypothermia has resolved.   Acute kidney injury: Resolved.  Continue IV fluids.  Hypotension: Blood pressure is improving.  She is on midodrine which we will continue.   Liver cirrhosis with portal hypertension s/p TIPS procedure in 2012 -Developed liver cirrhosis after chemotherapy with oxaliplatin -Followed by gastroenterology -Follow LFTs in a.m. -MELD score 15, 6% estimated 65-month mortality    UTI ruled out: UA not impressive and urine culture negative.  Will discontinue Rocephin.  Pancytopenia: Likely chemotherapy-induced.  Stable.  DVT prophylaxis: SCDs Start: 02/26/23 0035   Code Status: Full Code  Family Communication: Husband present at bedside.  Plan of care discussed with patient in length and he/she verbalized understanding and agreed with it.  Status is: Inpatient Remains inpatient appropriate because: Still encephalopathic but improving.   Estimated body mass index is 23.08 kg/m as calculated from the following:   Height as of this encounter: 5\' 4"  (1.626 m).   Weight as of this encounter: 61 kg.    Nutritional Assessment: Body mass index is 23.08 kg/m.Marland Kitchen Seen by dietician.  I agree with the assessment and plan as outlined below: Nutrition Status: Nutrition Problem: Severe Malnutrition Etiology: acute illness Signs/Symptoms: moderate fat depletion, moderate muscle depletion,  energy intake < or equal to 50% for > or equal to 5 days, percent weight loss (14.6% in 3 weeks) Percent weight loss: 14.6 % (in 3 weeks) Interventions: Refer to RD note for recommendations  . Skin Assessment: I have examined the patient's skin and I agree with the wound  assessment as performed by the wound care RN as outlined below:    Consultants:  GI  Procedures:  None  Antimicrobials:  Anti-infectives (From admission, onward)    Start     Dose/Rate Route Frequency Ordered Stop   02/26/23 2200  cefTRIAXone (ROCEPHIN) 2 g in sodium chloride 0.9 % 100 mL IVPB  Status:  Discontinued        2 g 200 mL/hr over 30 Minutes Intravenous Every 24 hours 02/26/23 0034 02/27/23 0953   02/25/23 2200  cefTRIAXone (ROCEPHIN) 1 g in sodium chloride 0.9 % 100 mL IVPB  Status:  Discontinued        1 g 200 mL/hr over 30 Minutes Intravenous Every 24 hours 02/25/23 2118 02/26/23 0034         Subjective: Patient seen and examined.  Patient is now able to follow simple commands when asked at least 2 or 3 times.  This is some improvement compared to yesterday.  Husband agrees.  Objective: Vitals:   02/28/23 0700 02/28/23 0800 02/28/23 0900 02/28/23 1000  BP: 133/62 (!) 148/50 (!) 142/61 (!) 128/51  Pulse: 60 61 64 (!) 59  Resp: 13 16 15 14   Temp:  (!) 96.3 F (35.7 C)    TempSrc:  Axillary    SpO2: 95% 93% 94% 93%  Weight:      Height:        Intake/Output Summary (Last 24 hours) at 02/28/2023 1050 Last data filed at 02/28/2023 0800 Gross per 24 hour  Intake 2665.11 ml  Output 1845 ml  Net 820.11 ml    Filed Weights   02/26/23 0031  Weight: 61 kg    Examination:  General exam: Appears lethargic. Respiratory system: Clear to auscultation. Respiratory effort normal. Cardiovascular system: S1 & S2 heard, RRR. No JVD, murmurs, rubs, gallops or clicks. No pedal edema. Gastrointestinal system: Abdomen is nondistended, soft and nontender. No organomegaly or masses felt. Normal bowel sounds heard. Central nervous system: Lethargic and likely not oriented.  No focal deficits. Skin: No rashes, lesions or ulcers.   Data Reviewed: I have personally reviewed following labs and imaging studies  CBC: Recent Labs  Lab 02/25/23 2025 02/26/23 0214  02/27/23 0254 02/28/23 0305  WBC 4.1 3.6* 3.0* 2.3*  NEUTROABS 2.7  --  1.6* 1.3*  HGB 12.1 10.6* 9.2* 9.4*  HCT 36.4 32.1* 28.5* 29.7*  MCV 102.8* 105.9* 106.7* 110.4*  PLT 142* 109* 101* 78*    Basic Metabolic Panel: Recent Labs  Lab 02/25/23 2025 02/26/23 0214 02/27/23 0254 02/27/23 0343 02/28/23 0305  NA 137 139 144  --  148*  K 3.4* 3.4* 2.9*  --  4.4  CL 98 104 112*  --  120*  CO2 26 25 25   --  23  GLUCOSE 101* 63* 97  --  114*  BUN 38* 33* 24*  --  15  CREATININE 1.13* 0.98 1.00  --  0.72  CALCIUM 9.8 8.9 9.1  --  9.6  MG  --  1.7  --  2.0  --   PHOS  --  3.4  --   --   --     GFR: Estimated Creatinine Clearance: 55.7 mL/min (  by C-G formula based on SCr of 0.72 mg/dL). Liver Function Tests: Recent Labs  Lab 02/25/23 2025 02/26/23 0214 02/27/23 0254 02/28/23 0305  AST 85* 70* 53* 41  ALT 58* 46* 36 28  ALKPHOS 274* 227* 184* 144*  BILITOT 2.1* 1.6* 1.4* 2.2*  PROT 7.2 5.9* 6.1* 6.9  ALBUMIN 3.1* 2.5* 3.3* 4.5    No results for input(s): "LIPASE", "AMYLASE" in the last 168 hours. Recent Labs  Lab 02/25/23 2025 02/26/23 0948 02/27/23 0254 02/28/23 0305  AMMONIA 163* 163* 54* 66*    Coagulation Profile: Recent Labs  Lab 02/25/23 2025 02/27/23 1143 02/28/23 0305  INR 1.5* 1.9* 2.1*    Cardiac Enzymes: No results for input(s): "CKTOTAL", "CKMB", "CKMBINDEX", "TROPONINI" in the last 168 hours. BNP (last 3 results) No results for input(s): "PROBNP" in the last 8760 hours. HbA1C: No results for input(s): "HGBA1C" in the last 72 hours. CBG: Recent Labs  Lab 02/27/23 1544 02/27/23 2004 02/27/23 2320 02/28/23 0340 02/28/23 0751  GLUCAP 97 101* 114* 100* 95    Lipid Profile: No results for input(s): "CHOL", "HDL", "LDLCALC", "TRIG", "CHOLHDL", "LDLDIRECT" in the last 72 hours. Thyroid Function Tests: Recent Labs    02/25/23 2036  TSH 1.260    Anemia Panel: No results for input(s): "VITAMINB12", "FOLATE", "FERRITIN", "TIBC",  "IRON", "RETICCTPCT" in the last 72 hours. Sepsis Labs: Recent Labs  Lab 02/26/23 0948  LATICACIDVEN 1.3     Recent Results (from the past 240 hour(s))  Urine Culture     Status: None   Collection Time: 02/25/23  8:06 PM   Specimen: Urine, Clean Catch  Result Value Ref Range Status   Specimen Description   Final    URINE, CLEAN CATCH Performed at [facility], [address], [place], [place] [address]    Special Requests   Final    NONE Performed at [facility], [address] [place], [place] [address]    Culture   Final    NO GROWTH Performed at [facility], [address], [place], [place] [address]    Report Status 02/26/2023 FINAL  Final  MRSA Next Gen by PCR, Nasal     Status: None   Collection Time: 02/26/23 12:16 AM   Specimen: Nasal Mucosa; Nasal Swab  Result Value Ref Range Status   MRSA by PCR Next Gen NOT DETECTED NOT DETECTED Final    Comment: (NOTE) The GeneXpert MRSA Assay (FDA approved for NASAL specimens only), is one component of a comprehensive MRSA colonization surveillance program. It is not intended to diagnose MRSA infection nor to guide or monitor treatment for MRSA infections. Test performance is not FDA approved in patients less than 59 years old. Performed at [facility], [address], [place], [place] [address]   Culture, blood (Routine X 2) w Reflex to ID Panel     Status: None (Preliminary result)   Collection Time: 02/26/23  2:14 AM   Specimen: BLOOD  Result Value Ref Range Status   Specimen Description   Final    BLOOD LEFT ANTECUBITAL Performed at [facility], [address], [place], [place] [address]    Special Requests   Final    BOTTLES DRAWN AEROBIC AND ANAEROBIC Blood Culture adequate volume Performed at [facility], [address] [place], [place] [address]    Culture   Final    NO GROWTH 2 DAYS Performed at [facility], [address], [place], [place] [address]    Report Status PENDING  Incomplete  Culture, blood (Routine X 2) w Reflex to ID Panel     Status: None (Preliminary result)   Collection Time: 02/26/23  2:14 AM   Specimen: BLOOD  Result Value Ref Range Status   Specimen Description   Final    BLOOD LEFT ANTECUBITAL Performed at [facility], [address] [place], [place] [address]    Special Requests   Final    BOTTLES DRAWN AEROBIC AND ANAEROBIC Blood Culture adequate volume Performed at [facility], [address], [place], [place] [address]    Culture   Final    NO GROWTH 2 DAYS Performed at [facility], [address] [place], [place] [address]    Report Status PENDING  Incomplete     Radiology Studies: DG Abd 1 View  Result Date: 02/27/2023 CLINICAL DATA:  NG tube placement EXAM: ABDOMEN - 1 VIEW COMPARISON:  02/26/2023 FINDINGS: Tip of feeding tube is seen in the fundus of the stomach. Tip of NG tube is pointing cephalad. There is interval withdrawal of NG tube with shorter length of NG tube within the stomach in the current study in comparison with the examination of 02/26/2023. IMPRESSION: Tip of feeding tube is seen in the fundus of the stomach. Electronically Signed   By: Ernie Avena M.D.   On: 02/27/2023 19:19   US ABDOMEN LIMITED WITH LIVER DOPPLER  Result Date: 02/26/2023 CLINICAL DATA:  History of cirrhosis with prior TIPS placement at [facility] in 2012 due to variceal hemorrhage. Hepatic encephalopathy. EXAM: DUPLEX ULTRASOUND OF LIVER AND TIPS SHUNT TECHNIQUE: Color and duplex Doppler ultrasound was performed to evaluate the hepatic in-flow and out-flow vessels. COMPARISON:  PRIOR DUPLEX ULTRASOUND ON 05/29/2021 FINDINGS: Portal Vein Velocities Main:  86 cm/sec Right:  21 cm/sec Left:  32 cm/sec TIPS Stent Velocities Proximal:  116 cm/sec Mid:  109 Distal:  71 cm/sec IVC: Present and patent with normal respiratory  phasicity. Hepatic Vein Velocities Right:  47 cm/sec Mid:  48 cm/sec Left:  45 cm/sec Splenic Vein: 28 cm/sec Superior Mesenteric Vein: 54 cm/sec Hepatic Artery: 136 cm/sec Ascities: Absent Varices: Absent Other findings: TIPS shunt is normally patent without evidence to suggest thrombus or stenosis. No evidence of portal vein thrombus. IMPRESSION: Normally patent TIPS shunt. No evidence of portal vein thrombus or TIPS stenosis. Electronically Signed   By: Irish Lack M.D.   On: 02/26/2023 15:07   DG Abd 1 View  Result Date: 02/26/2023 CLINICAL DATA:  Feeding tube placement EXAM: ABDOMEN - 1 VIEW COMPARISON:  02/24/2023 FINDINGS: Bowel gas pattern is nonspecific. Moderate amount of stool is seen in colon. Tip of NG tube is seen in the fundus of the stomach. Distal portion of NG tube is coiled with its tip pointing cephalad. There is a vascular stent overlying the liver IMPRESSION: Tip of NG tube is seen in the fundus of the stomach. Distal course of NG tube is coiled within stomach with its tip pointing cephalad. Electronically Signed   By: Ernie Avena M.D.   On: 02/26/2023 15:00    Scheduled Meds:  Chlorhexidine Gluconate Cloth  6 each Topical Daily   lactulose  30 g Per Tube Q6H   levothyroxine  75 mcg Per Tube Q0600   midodrine  5 mg Per Tube TID WC   mouth rinse  15 mL Mouth Rinse 4 times per day   Continuous Infusions:  dextrose  5 % and 0.9 % NaCl with KCl 40 mEq/L 100 mL/hr at 02/28/23 0800     LOS: 3 days   Hughie Clossavi Blanch Stang, MD [facility]  02/28/2023, 10:50 AM   *Please note that this is a verbal dictation therefore any spelling or grammatical errors are due to the "[employer]agon [employer] One" system interpretation.  Please page via Amion and do not message via secure chat for urgent patient care matters. Secure chat can be used for non urgent patient care matters.  How to contact the [facility] Attending or Consulting provider 7A - 7P or covering provider during after hours 7P -7A,  for this patient?  Check the care team in [facility] and look for a) attending/consulting TR[facility] provider listed and b) the [facility] team listed. Page or secure chat 7A-7P. Log into [email or web address] and use [place]'s universal password to access. If you do not have the password, please contact the hospital operator. Locate the [facility] provider you are looking for under [facility] and page to a number that you can be directly reached. If you still have difficulty reaching the provider, please page the [facility] LPDOC (Director on Call) for the Hospitalists listed on amion for assistance.

## 2023-02-28 NOTE — Progress Notes (Signed)
OT Cancellation Note  Patient Details Name: [name] MRN: [id number] DOB: Dec 28, 1950   Cancelled Treatment:    Reason Eval/Treat Not Completed: Other (comment) OT deferred this date on advice of RN 2* pt continued confusion.  Will follow.    Alba Cory 02/28/2023, 9:17 AM

## 2023-03-01 DIAGNOSIS — D649 Anemia, unspecified: Secondary | ICD-10-CM

## 2023-03-01 DIAGNOSIS — R4182 Altered mental status, unspecified: Secondary | ICD-10-CM

## 2023-03-01 DIAGNOSIS — K7682 Hepatic encephalopathy: Secondary | ICD-10-CM | POA: Diagnosis not present

## 2023-03-01 DIAGNOSIS — E876 Hypokalemia: Secondary | ICD-10-CM

## 2023-03-01 DIAGNOSIS — E039 Hypothyroidism, unspecified: Secondary | ICD-10-CM | POA: Diagnosis not present

## 2023-03-01 DIAGNOSIS — K7460 Unspecified cirrhosis of liver: Secondary | ICD-10-CM | POA: Diagnosis not present

## 2023-03-01 DIAGNOSIS — N3001 Acute cystitis with hematuria: Secondary | ICD-10-CM | POA: Diagnosis not present

## 2023-03-01 LAB — CULTURE, BLOOD (ROUTINE X 2): Special Requests: ADEQUATE

## 2023-03-01 LAB — GLUCOSE, CAPILLARY
Glucose-Capillary: 120 mg/dL — ABNORMAL HIGH (ref 70–99)
Glucose-Capillary: 104 mg/dL — ABNORMAL HIGH (ref 70–99)
Glucose-Capillary: 105 mg/dL — ABNORMAL HIGH (ref 70–99)
Glucose-Capillary: 101 mg/dL — ABNORMAL HIGH (ref 70–99)
Glucose-Capillary: 94 mg/dL (ref 70–99)

## 2023-03-01 LAB — CBC WITH DIFFERENTIAL/PLATELET
Abs Immature Granulocytes: 0.02 10*3/uL (ref 0.00–0.07)
Basophils Absolute: 0 10*3/uL (ref 0.0–0.1)
Basophils Relative: 1 %
Eosinophils Absolute: 0.1 10*3/uL (ref 0.0–0.5)
Eosinophils Relative: 2 %
HCT: 31.8 % — ABNORMAL LOW (ref 36.0–46.0)
Hemoglobin: 10.2 g/dL — ABNORMAL LOW (ref 12.0–15.0)
Immature Granulocytes: 0 %
Lymphocytes Relative: 11 %
Lymphs Abs: 0.7 10*3/uL (ref 0.7–4.0)
MCH: 35.2 pg — ABNORMAL HIGH (ref 26.0–34.0)
MCHC: 32.1 g/dL (ref 30.0–36.0)
MCV: 109.7 fL — ABNORMAL HIGH (ref 80.0–100.0)
Monocytes Absolute: 0.4 10*3/uL (ref 0.1–1.0)
Monocytes Relative: 7 %
Neutro Abs: 5.1 10*3/uL (ref 1.7–7.7)
Neutrophils Relative %: 79 %
Platelets: 106 10*3/uL — ABNORMAL LOW (ref 150–400)
RBC: 2.9 MIL/uL — ABNORMAL LOW (ref 3.87–5.11)
RDW: 19.3 % — ABNORMAL HIGH (ref 11.5–15.5)
WBC: 6.4 10*3/uL (ref 4.0–10.5)
nRBC: 0 % (ref 0.0–0.2)

## 2023-03-01 LAB — COMPREHENSIVE METABOLIC PANEL
ALT: 28 U/L (ref 0–44)
AST: 40 U/L (ref 15–41)
Albumin: 4 g/dL (ref 3.5–5.0)
Alkaline Phosphatase: 157 U/L — ABNORMAL HIGH (ref 38–126)
Anion gap: 7 (ref 5–15)
BUN: 10 mg/dL (ref 8–23)
CO2: 19 mmol/L — ABNORMAL LOW (ref 22–32)
Calcium: 9.3 mg/dL (ref 8.9–10.3)
Chloride: 119 mmol/L — ABNORMAL HIGH (ref 98–111)
Creatinine, Ser: 0.63 mg/dL (ref 0.44–1.00)
GFR, Estimated: >60 mL/min (ref >60)
Glucose, Bld: 99 mg/dL (ref 70–99)
Potassium: 3.7 mmol/L (ref 3.5–5.1)
Sodium: 145 mmol/L (ref 135–145)
Total Bilirubin: 2.9 mg/dL — ABNORMAL HIGH (ref 0.3–1.2)
Total Protein: 6.5 g/dL (ref 6.5–8.1)

## 2023-03-01 LAB — MAGNESIUM
Magnesium: 1.9 mg/dL (ref 1.7–2.4)
Magnesium: 1.7 mg/dL (ref 1.7–2.4)

## 2023-03-01 LAB — AMMONIA: Ammonia: 50 umol/L — ABNORMAL HIGH (ref 9–35)

## 2023-03-01 LAB — PROTIME-INR
INR: 2.1 — ABNORMAL HIGH (ref 0.8–1.2)
Prothrombin Time: 23.7 seconds — ABNORMAL HIGH (ref 11.4–15.2)

## 2023-03-01 LAB — PHOSPHORUS
Phosphorus: 2.5 mg/dL (ref 2.5–4.6)
Phosphorus: 2.7 mg/dL (ref 2.5–4.6)

## 2023-03-01 MED ORDER — VITAMIN K1 10 MG/ML IJ SOLN
10.0000 mg | Freq: Every day | INTRAMUSCULAR | Status: AC
  Administered 2023-03-01 – 2023-03-02 (×2): 10 mg via SUBCUTANEOUS
  Filled 2023-03-01 (×2): qty 1

## 2023-03-01 MED ORDER — THIAMINE MONONITRATE 100 MG PO TABS
100.0000 mg | ORAL_TABLET | Freq: Every day | ORAL | Status: DC
  Administered 2023-03-01 – 2023-03-03 (×3): 100 mg
  Filled 2023-03-01 (×4): qty 1

## 2023-03-01 MED ORDER — OSMOLITE 1.5 CAL PO LIQD
1000.0000 mL | ORAL | Status: DC
  Administered 2023-03-01 – 2023-03-02 (×2): 1000 mL
  Filled 2023-03-01 (×3): qty 1000

## 2023-03-01 MED ORDER — MIDODRINE HCL 5 MG PO TABS
5.0000 mg | ORAL_TABLET | Freq: Two times a day (BID) | ORAL | Status: DC
  Administered 2023-03-01 – 2023-03-03 (×5): 5 mg
  Filled 2023-03-01 (×6): qty 1

## 2023-03-01 NOTE — Plan of Care (Signed)
Discussed with patient plan of care for the evening, pain management and promoting rest with some teach back displayed.  Family in room and very supportive.  Problem: Education: Goal: Knowledge of General Education information will improve Description: Including pain rating scale, medication(s)/side effects and non-pharmacologic comfort measures Outcome: Progressing   Problem: Health Behavior/Discharge Planning: Goal: Ability to manage health-related needs will improve Outcome: Progressing

## 2023-03-01 NOTE — Progress Notes (Signed)
Inpatient Rehab Admissions Coordinator:  ? ?Per therapy recommendations,  patient was screened for CIR candidacy by Meko Bellanger, MS, CCC-SLP. At this time, Pt. Appears to be a a potential candidate for CIR. I will place   order for rehab consult per protocol for full assessment. Please contact me any with questions. ? ?Caio Devera, MS, CCC-SLP ?Rehab Admissions Coordinator  ?[phone] (celll) ?[phone] (office) ? ?

## 2023-03-01 NOTE — Progress Notes (Addendum)
Patient ID: [name], female   DOB: 08/22/1951, 72 y.o.   MRN: [id number]    Progress Note   Subjective   Day # 4 CC; decompensated cirrhosis secondary to remote chemotherapy, (adenocarcinoma of the colon 2004) with status post TIPS 2012 for variceal bleed, chronic hepatic encephalopathy-presenting with altered mental status in setting of UTI-constipated at home over the past few weeks  Labs today-venous ammonia 50 WBC 6.4/hemoglobin 10.2/hematocrit 31.8/platelets 106 Pro time 23.7/INR 2.1, on the rise Sodium 145/potassium 3.7/BUN 10/creatinine 0.63 T. bili 2.9/alk phos 157/AST 40/ALT 28  Blood cultures no growth x 3 days Final urine culture no growth  Husband at bedside, patient had been out of bed with physical therapy, needs two-person assist to stand She is significantly more alert, said "well hello there"-still not conversant, she does know that it is 2024, knows she is in the hospital and thought it was March.  No complaints of pain or discomfort Flexiseal  removed    Objective   Vital signs in last 24 hours: Temp:  [96.8 F (36 C)-97.4 F (36.3 C)] 97.1 F (36.2 C) (04/08 0745) Pulse Rate:  [51-78] 78 (04/08 0700) Resp:  [13-24] 18 (04/08 0700) BP: (100-152)/(50-89) 139/63 (04/08 0700) SpO2:  [91 %-94 %] 93 % (04/08 0700) Last BM Date : 02/28/23 General:  older   white female in NAD, awake, able to say a few words which are appropriate, very weak Heart:  Regular rate and rhythm; no murmurs Lungs: Respirations even and unlabored, lungs CTA bilaterally Abdomen:  Soft, nontender and nondistended. Normal bowel sounds. Extremities:  Without edema. Neurologic:  Alert and oriented x3 grossly normal neurologically.  No asterixis Psych:  Cooperative. Normal mood and affect.  Intake/Output from previous day: 04/07 0701 - 04/08 0700 In: 1869.7 [I.V.:1189.7; NG/GT:680] Out: 2735 [Urine:2525; Emesis/NG output:175; Stool:35] Intake/Output this shift: No intake/output data  recorded.  Lab Results: Recent Labs    02/27/23 0254 02/28/23 0305 03/01/23 0309  WBC 3.0* 2.3* 6.4  HGB 9.2* 9.4* 10.2*  HCT 28.5* 29.7* 31.8*  PLT 101* 78* 106*   BMET Recent Labs    02/27/23 0254 02/28/23 0305 03/01/23 0309  NA 144 148* 145  K 2.9* 4.4 3.7  CL 112* 120* 119*  CO2 25 23 19*  GLUCOSE 97 114* 99  BUN 24* 15 10  CREATININE 1.00 0.72 0.63  CALCIUM 9.1 9.6 9.3   LFT Recent Labs    03/01/23 0309  PROT 6.5  ALBUMIN 4.0  AST 40  ALT 28  ALKPHOS 157*  BILITOT 2.9*   PT/INR Recent Labs    02/28/23 0305 03/01/23 0309  LABPROT 23.4* 23.7*  INR 2.1* 2.1*    Studies/Results: DG Abd 1 View  Result Date: 02/27/2023 CLINICAL DATA:  NG tube placement EXAM: ABDOMEN - 1 VIEW COMPARISON:  02/26/2023 FINDINGS: Tip of feeding tube is seen in the fundus of the stomach. Tip of NG tube is pointing cephalad. There is interval withdrawal of NG tube with shorter length of NG tube within the stomach in the current study in comparison with the examination of 02/26/2023. IMPRESSION: Tip of feeding tube is seen in the fundus of the stomach. Electronically Signed   By: Ernie Avena M.D.   On: 02/27/2023 19:19       Assessment / Plan:    #65 72 year old white female with decompensated cirrhosis, M ELD 3.0= 18 Status post TIPS 2012 after variceal bleed History of chronic hepatic encephalopathy generally managed just with lactulose, admitted with  altered mental status and became obtunded within the first 12 hours of admission This was in the setting of probable her urinary tract infection and then significant constipation over the past 3 to 4 weeks after she had fractured her wrist and was taking analgesics. Had been unable to obtain Xifaxan due to cost in the past  She is definitely making progress, is very weak, but alert able to answer a few simple questions Panda tube in place, receiving lactulose via Panda tube  Following eval today okayed for water and ice  chips  #2 history of cecal adenocarcinoma status postresection 2004 with adjuvant chemo therapy which caused cirrhosis  #3 Coagulopathy progressive since admission  #4  hypokalemia corrected #5 anemia stable  Plan; start tube feedings via Panda tube low rate, hopefully over the next few days she will be able to transition to regular food. Continue lactulose high-dose via Panda tube as doing Vitamin K x 3 days Will ask office staff to initiate prior authorization or possibility of assistance program for Xifaxan she will definitely benefit from long-term.      Principal Problem:   Acute hepatic encephalopathy Active Problems:   Protein-calorie malnutrition, severe     LOS: 4 days   Amy Esterwood PA-C 03/01/2023, 9:09 AM    Attending physician's note   I have taken history, reviewed the chart and examined the patient. I performed a substantive portion of this encounter, including complete performance of at least one of the key components, in conjunction with the APP. I agree with the Advanced Practitioner's note, impression and recommendations.   Received signout from Dr. Russella Dar  Acute on chronic hepatic encephalopathy-gradually improving on lactulose thru NG.  Likely ppt by narcotics.  Decompensated liver cirrhosis (d/t oxaliplatin) s/p TIPS at [facility] 2012. TIPS patent. H/O cecal adenoCa s/p resection 2004 with neoadjuvant chemo (oxaliplatin)  Plan; -Continue lactulose -Once able to tolerate, add rifaximin 550BID and Zn -Vit K for coagulopathy -Monitor and correct electrolytes. -Check AFP, CEA -FU [facility].   Edman Circle, MD [facility] [phone]

## 2023-03-01 NOTE — Evaluation (Signed)
Occupational Therapy Evaluation Patient Details Name: [name] MRN: [id number] DOB: 1951-10-10 Today's Date: 03/01/2023   History of Present Illness Patient is 72 year old female admitted on 02/25/23 with acute hepatic encephalopathy. Pt with fall in March 2023 that resulted in L wrist fx.  She was on opioids which caused constipation and even with increased lactulose she was not having bowel movements and developed encephalopathy and hypothermia.  Pt with hx including but not limited to colon CA, liver cirrhosis, TIPS, hepatic encephalopathy.   Clinical Impression   Patient is a 72 year old female who was admitted for above. Patient was living at home independently prior level with husband. Currently, patient is +2 HHA for standing with inability to advance BLE to take steps. Patient was max A for grooming and bathing tasks with cast in place on LUE. Patient was noted to have decreased functional activity tolerance, decreased endurance, decreased standing balance, decreased safety awareness, and decreased knowledge of AD/AE impacting participation in ADLs. Patient would continue to benefit from skilled OT services at this time while admitted and after d/c to address noted deficits in order to improve overall safety and independence in ADLs.       Recommendations for follow up therapy are one component of a multi-disciplinary discharge planning process, led by the attending physician.  Recommendations may be updated based on patient status, additional functional criteria and insurance authorization.   Assistance Recommended at Discharge Frequent or constant Supervision/Assistance  Patient can return home with the following Two people to help with walking and/or transfers;A lot of help with bathing/dressing/bathroom;Direct supervision/assist for medications management;Assistance with cooking/housework;Assist for transportation;Help with stairs or ramp for entrance;Direct supervision/assist for  financial management    Functional Status Assessment  Patient has had a recent decline in their functional status and demonstrates the ability to make significant improvements in function in a reasonable and predictable amount of time.  Equipment Recommendations  None recommended by OT       Precautions / Restrictions Precautions Precautions: Fall Restrictions Weight Bearing Restrictions: No      Mobility Bed Mobility Overal bed mobility: Needs Assistance Bed Mobility: Supine to Sit, Sit to Supine     Supine to sit: Mod assist, +2 for physical assistance Sit to supine: Mod assist, +2 for physical assistance   General bed mobility comments: increased time and multimodal cues with assist for legs and trunk    Transfers Overall transfer level: Needs assistance Equipment used: 2 person hand held assist Transfers: Sit to/from Stand Sit to Stand: Mod assist, +2 physical assistance           General transfer comment: Pt reports fatigued and overwhelmed.  Not able to take steps and declined pivot to chair, did pivot closer to [facility].  Cues to lean forward to stand      Balance Overall balance assessment: Needs assistance Sitting-balance support: Single extremity supported, No upper extremity supported Sitting balance-Leahy Scale: Poor Sitting balance - Comments: Initially requiring mod A with tendency to lean posteriorly and Left.  Improved with time and multimodal cues to lean forward or reach forward.  Improved to close guarding with occasional min A. Sat EOB for ~8 mins   Standing balance support: Bilateral upper extremity supported Standing balance-Leahy Scale: Poor           ADL either performed or assessed with clinical judgement   ADL Overall ADL's : Needs assistance/impaired Eating/Feeding: NPO   Grooming: Minimal assistance;Sitting   Upper Body Bathing: Minimal assistance;Sitting  Upper Body Bathing Details (indicate cue type and reason): assumed unable to  test with patient attempting to pull NG tube out of nose. mit on R hand in place and cast on L side Lower Body Bathing: Maximal assistance;Sit to/from stand   Upper Body Dressing : Minimal assistance;Sitting   Lower Body Dressing: Maximal assistance;Sitting/lateral leans   Toilet Transfer: Moderate assistance;+2 for safety/equipment;+2 for physical assistance   Toileting- Clothing Manipulation and Hygiene: Total assistance;Sit to/from stand                Pertinent Vitals/Pain Pain Assessment Pain Assessment: No/denies pain     Hand Dominance Right   Extremity/Trunk Assessment Upper Extremity Assessment Upper Extremity Assessment: LUE deficits/detail LUE Deficits / Details: cast on L wrist, able to wiggle digits, assumed NWB   Lower Extremity Assessment Lower Extremity Assessment: Defer to PT evaluation RLE Deficits / Details: ROM WFL; MMT at least 3/5 but not following further commands LLE Deficits / Details: ROM WFL; MMT at least 3/5 but not following further commands   Cervical / Trunk Assessment Cervical / Trunk Assessment: Normal   Communication Communication Communication: No difficulties   Cognition Arousal/Alertness: Awake/alert Behavior During Therapy: Anxious, Flat affect Overall Cognitive Status: Impaired/Different from baseline Area of Impairment: Orientation, Attention, Following commands, Awareness, Problem solving, Safety/judgement                 Orientation Level: Disoriented to, Time, Situation Current Attention Level: Sustained   Following Commands: Follows one step commands with increased time Safety/Judgement: Decreased awareness of safety Awareness: Intellectual Problem Solving: Slow processing, Decreased initiation, Difficulty sequencing, Requires verbal cues, Requires tactile cues General Comments: Spouse reports significant improvement but not baseline. She was oriented to self, place, and year (did state month is March).  Pt  requiring increased time to respond.     General Comments  VSS; BP 130's/60's in sitting, HR 60's            Home Living Family/patient expects to be discharged to:: Private residence Living Arrangements: Spouse/significant other Available Help at Discharge: Family;Available PRN/intermittently Type of Home: House Home Access: Ramped entrance     Home Layout: One level     Bathroom Shower/Tub: Producer, television/film/videoWalk-in shower   Bathroom Toilet: Standard Bathroom Accessibility: Yes   Home Equipment: Agricultural consultantolling Walker (2 wheels);BSC/3in1;Cane - single point          Prior Functioning/Environment Prior Level of Function : Independent/Modified Independent;Working/employed             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, 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 ADLs Comments: independent        OT Problem List: Decreased activity tolerance;Impaired balance (sitting and/or standing);Decreased coordination;Decreased safety awareness;Decreased knowledge of precautions;Impaired UE functional use;Decreased knowledge of use of DME or AE;Cardiopulmonary status limiting activity      OT Treatment/Interventions: Self-care/ADL training;Energy conservation;Therapeutic exercise;DME and/or AE instruction;Therapeutic activities;Patient/family education;Balance training;Cognitive remediation/compensation    OT Goals(Current goals can be found in the care plan section) Acute Rehab OT Goals Patient Stated Goal: to get back in bed OT Goal Formulation: With patient/family Time For Goal Achievement: 03/15/23 Potential to Achieve Goals: Fair  OT Frequency: Min 2X/week    Co-evaluation PT/OT/SLP Co-Evaluation/Treatment: Yes Reason for Co-Treatment: Complexity of the patient's impairments (multi-system involvement) PT goals addressed during session: Mobility/safety with mobility;Balance OT goals addressed during session: ADL's and self-care      AM-PAC OT "6 Clicks" Daily Activity     Outcome Measure  Help from another  person eating meals?: Total (NPO) Help from another person taking care of personal grooming?: A Little Help from another person toileting, which includes using toliet, bedpan, or urinal?: A Lot Help from another person bathing (including washing, rinsing, drying)?: A Lot Help from another person to put on and taking off regular upper body clothing?: A Lot Help from another person to put on and taking off regular lower body clothing?: A Lot 6 Click Score: 12   End of Session Equipment Utilized During Treatment: Gait belt Nurse Communication: Other (comment) (ok to participate in session)  Activity Tolerance: Patient limited by fatigue Patient left: in bed;with call bell/phone within reach;with bed alarm set;with family/visitor present  OT Visit Diagnosis: Unsteadiness on feet (R26.81);Other abnormalities of gait and mobility (R26.89);Muscle weakness (generalized) (M62.81)                Time: 7106-2694 OT Time Calculation (min): 14 min Charges:  OT General Charges $OT Visit: 1 Visit OT Evaluation $OT Eval Moderate Complexity: 1 Mod  Jakai Risse OTR/L, MS Acute Rehabilitation Department Office# [phone]   Selinda Flavin 03/01/2023, 3:25 PM

## 2023-03-01 NOTE — Evaluation (Signed)
Physical Therapy Evaluation Patient Details Name: [name] MRN: [id number] DOB: [birth year] Today's Date: 03/01/2023  History of Present Illness  Pt is 72 yo female admitted on 02/25/23 with acute hepatic encephalopathy. Pt with fall in March 2023 that resulted in L wrist fx.  She was on opioids which caused constipation and even with increased lactulose she was not having bowel movements and developed encephalopathy and hypothermia.  Pt with hx including but not limited to colon CA, liver cirrhosis, TIPS, hepatic encephalopathy.  Clinical Impression  Pt admitted with above diagnosis. At baseline, pt is active, independent, and working.  Today, pt required mod A of 2 for transfers and to stand at EOB>  She was not able to progress to ambulation today due to fatigue and reports feels "overwhelmed."  Considering 4 days in ICU, pt did well for her first time up.  Pt is expected to progress very well with therapy has her encephalopathy continues to resolve.  Pt with excellent rehab potential and has support at home..  Pt currently with functional limitations due to the deficits listed below (see PT Problem List). Pt will benefit from acute skilled PT to increase their independence and safety with mobility to allow discharge.  Additionally, Patient will benefit from intensive inpatient follow up therapy, >3 hours/day         Recommendations for follow up therapy are one component of a multi-disciplinary discharge planning process, led by the attending physician.  Recommendations may be updated based on patient status, additional functional criteria and insurance authorization.  Follow Up Recommendations Can patient physically be transported by private vehicle: No     Assistance Recommended at Discharge Frequent or constant Supervision/Assistance  Patient can return home with the following  A lot of help with walking and/or transfers;A lot of help with bathing/dressing/bathroom    Equipment  Recommendations Wheelchair (measurements PT);Wheelchair cushion (measurements PT)  Recommendations for Other Services  Rehab consult    Functional Status Assessment Patient has had a recent decline in their functional status and demonstrates the ability to make significant improvements in function in a reasonable and predictable amount of time.     Precautions / Restrictions Precautions Precautions: Fall Restrictions Weight Bearing Restrictions: No      Mobility  Bed Mobility Overal bed mobility: Needs Assistance Bed Mobility: Supine to Sit, Sit to Supine     Supine to sit: Mod assist, +2 for physical assistance Sit to supine: Mod assist, +2 for physical assistance   General bed mobility comments: increased time and multimodal cues with assist for legs and trunk    Transfers Overall transfer level: Needs assistance Equipment used: 2 person hand held assist Transfers: Sit to/from Stand Sit to Stand: Mod assist, +2 physical assistance           General transfer comment: Pt reports fatigued and overwhelmed.  Not able to take steps and declined pivot to chair, did pivot closer to [facility].  Cues to lean forward to stand    Ambulation/Gait               General Gait Details: defered  Stairs            Wheelchair Mobility    Modified Rankin (Stroke Patients Only)       Balance Overall balance assessment: Needs assistance Sitting-balance support: Single extremity supported, No upper extremity supported Sitting balance-Leahy Scale: Poor Sitting balance - Comments: Initially requiring mod A with tendency to lean posteriorly and Left.  Improved with time  and multimodal cues to lean forward or reach forward.  Improved to close guarding with occasional min A. Sat EOB for ~8 mins   Standing balance support: Bilateral upper extremity supported Standing balance-Leahy Scale: Poor                               Pertinent Vitals/Pain Pain  Assessment Pain Assessment: No/denies pain    Home Living Family/patient expects to be discharged to:: Private residence Living Arrangements: Spouse/significant other Available Help at Discharge: Family;Available PRN/intermittently Type of Home: House Home Access: Ramped entrance       Home Layout: One level Home Equipment: Rolling Walker (2 wheels);BSC/3in1;Cane - single point      Prior Function Prior Level of Function : Independent/Modified Independent;Working/employed             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, 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 ADLs Comments: independent     Hand Dominance        Extremity/Trunk Assessment   Upper Extremity Assessment Upper Extremity Assessment: Defer to OT evaluation    Lower Extremity Assessment Lower Extremity Assessment: LLE deficits/detail;RLE deficits/detail;Difficult to assess due to impaired cognition RLE Deficits / Details: ROM WFL; MMT at least 3/5 but not following further commands LLE Deficits / Details: ROM WFL; MMT at least 3/5 but not following further commands    Cervical / Trunk Assessment Cervical / Trunk Assessment: Normal  Communication   Communication: No difficulties  Cognition Arousal/Alertness: Awake/alert Behavior During Therapy: Anxious, Flat affect Overall Cognitive Status: Impaired/Different from baseline Area of Impairment: Orientation, Attention, Following commands, Awareness, Problem solving, Safety/judgement                 Orientation Level: Disoriented to, Time, Situation Current Attention Level: Sustained   Following Commands: Follows one step commands with increased time Safety/Judgement: Decreased awareness of safety Awareness: Intellectual Problem Solving: Slow processing, Decreased initiation, Difficulty sequencing, Requires verbal cues, Requires tactile cues General Comments: Spouse reports significant improvement but not baseline. She was oriented to self, place,  and year (did state month is March).  Pt requiring increased time to respond.        General Comments General comments (skin integrity, edema, etc.): VSS; BP 130's/60's in sitting, HR 60's    Exercises     Assessment/Plan    PT Assessment Patient needs continued PT services  PT Problem List Decreased strength;Decreased coordination;Decreased range of motion;Decreased activity tolerance;Decreased balance;Decreased mobility;Decreased knowledge of precautions;Decreased safety awareness;Decreased knowledge of use of DME       PT Treatment Interventions DME instruction;Therapeutic exercise;Gait training;Balance training;Modalities;Functional mobility training;Therapeutic activities;Patient/family education;Cognitive remediation    PT Goals (Current goals can be found in the Care Plan section)  Acute Rehab PT Goals Patient Stated Goal: return home PT Goal Formulation: With patient/family Time For Goal Achievement: 03/15/23 Potential to Achieve Goals: Good    Frequency Min 1X/week     Co-evaluation PT/OT/SLP Co-Evaluation/Treatment: Yes Reason for Co-Treatment: Complexity of the patient's impairments (multi-system involvement) PT goals addressed during session: Mobility/safety with mobility;Balance OT goals addressed during session: ADL's and self-care       AM-PAC PT "6 Clicks" Mobility  Outcome Measure Help needed turning from your back to your side while in a flat bed without using bedrails?: A Lot Help needed moving from lying on your back to sitting on the side of a flat bed without using bedrails?: Total Help needed moving to and from  a bed to a chair (including a wheelchair)?: Total Help needed standing up from a chair using your arms (e.g., wheelchair or bedside chair)?: Total Help needed to walk in hospital room?: Total Help needed climbing 3-5 steps with a railing? : Total 6 Click Score: 7    End of Session   Activity Tolerance: Patient limited by fatigue Patient  left: in bed;with call bell/phone within reach;with family/visitor present Nurse Communication: Mobility status PT Visit Diagnosis: Other abnormalities of gait and mobility (R26.89);Muscle weakness (generalized) (M62.81)    Time: 1040-1103 PT Time Calculation (min) (ACUTE ONLY): 23 min   Charges:   PT Evaluation $PT Eval Moderate Complexity: 1 Mod          Harlyn Italiano, PT Acute Rehab [facility] Re[facility] [phone]   Rayetta Humphrey 03/01/2023, 11:40 AM

## 2023-03-01 NOTE — Evaluation (Signed)
Clinical/Bedside Swallow Evaluation Patient Details  Name: [name] MRN: [id number] Date of Birth: 1951-06-10  Today's Date: 03/01/2023 Time: SLP Start Time (ACUTE ONLY): 0920 SLP Stop Time (ACUTE ONLY): 0935 SLP Time Calculation (min) (ACUTE ONLY): 15 min  Past Medical History:  Past Medical History:  Diagnosis Date   Acute urinary retention 05/04/2022   Allergy 2006 ?   Contrast dye   Arthritis 2016 ??   Knees and thumb   Cancer    cecum   Cataract 2021   Surgery scheduled July 2023   Colon cancer 2003   Elevated liver function tests    Esophageal varices    Heart murmur On file   Hemorrhage of gastrointestinal tract 05/04/2011   Hypertension 2021   Hypothyroidism    Iron deficiency anemia    Liver disease    chemotherapy complication, per pt, shunts placed to bypass liver   Malignant neoplasm of cecum    Portal hypertension    Skin cancer 2019   Splenomegaly    Past Surgical History:  Past Surgical History:  Procedure Laterality Date   COLON SURGERY  2004   Cancer   COSMETIC SURGERY  2021   Skin cancer   ESOPHAGEAL VARICE LIGATION     EYE SURGERY     HEMICOLECTOMY  01/08/2003   IR RADIOLOGIST EVAL & MGMT  12/20/2020   IR RADIOLOGIST EVAL & MGMT  05/29/2021   LIVER SURGERY     shunts placed after chemo complication   SKIN FULL THICKNESS GRAFT N/A 09/12/2019   Procedure: debridement and FTSG to the nose from left upper arm;  Surgeon: Allena Napoleon, MD;  Location: [facility];  Service: Plastics;  Laterality: N/A;  2 hours, please   TIPS PROCEDURE     HPI:  Patient is a 72 y.o. female with PMH: colon cancer s/p partial colectomy and chemotherapy, liver cirrhosis, hepatic encephalopathy. She recently broke her wrist and was given narcotic and became constipated. She presented to the hospital on 02/25/23 with confusion, nausea and vomiting for two days prior. In ED she was lethargic and hypothermic, UA positive for pyuria, ammonia was elevated.  She has been NPO due to being too lethargic for oral intake.    Assessment / Plan / Recommendation  Clinical Impression  Patient is presenting with clinical s/s of dysphagia as per this bedside swallow evaluation with primary impact at this time from her state of lethargy, fatigue. Patient was able to open her eyes but initially would close them frequently. SLP performed oral care and aside from dryness of oral mucosa, no significant secretions observed and spouse reporting that they have been frequently using oral care swabs to keep mouth clean. Patient was receptive to having ice chips (small, one at a time). She was able to orally manipulate and masticate, though this was prolonged. SLP then presented thin liquids (water) on spoon, progressing to cup and then straw sips. Mildly delayed swallow initiation observed but only one instance of very brief cough after one of the water sips. Patient was not initially communicating but she did start to speak to request "more ice". Voice was weak, hoarse and low in vocal intensity and spouse reporting that this is the first she had spoken since being admitted. SLP is recommending continue NPO status but allow PRN ice, water when patient fully awake and alert, after oral care and with full supervision. Suspect that she will be able to tolerate solids and liquids when less lethargic, fatigued.  SLP will follow for PO trials. SLP Visit Diagnosis: Dysphagia, unspecified (R13.10)    Aspiration Risk  Mild aspiration risk    Diet Recommendation Ice chips PRN after oral care;NPO   Liquid Administration via: Cup;Straw Medication Administration: Via alternative means Supervision: Full supervision/cueing for compensatory strategies Compensations: Slow rate;Small sips/bites Postural Changes: Seated upright at 90 degrees    Other  Recommendations Oral Care Recommendations: Oral care BID;Staff/trained caregiver to provide oral care;Oral care prior to ice chip/H20     Recommendations for follow up therapy are one component of a multi-disciplinary discharge planning process, led by the attending physician.  Recommendations may be updated based on patient status, additional functional criteria and insurance authorization.  Follow up Recommendations Other (comment) (TBD)      Assistance Recommended at Discharge    Functional Status Assessment Patient has had a recent decline in their functional status and demonstrates the ability to make significant improvements in function in a reasonable and predictable amount of time.  Frequency and Duration min 2x/week  2 weeks       Prognosis Prognosis for improved oropharyngeal function: Good      Swallow Study   General Date of Onset: 02/25/23 HPI: Patient is a 72 y.o. female with PMH: colon cancer s/p partial colectomy and chemotherapy, liver cirrhosis, hepatic encephalopathy. She recently broke her wrist and was given narcotic and became constipated. She presented to the hospital on 02/25/23 with confusion, nausea and vomiting for two days prior. In ED she was lethargic and hypothermic, UA positive for pyuria, ammonia was elevated. She has been NPO due to being too lethargic for oral intake. Type of Study: Bedside Swallow Evaluation Previous Swallow Assessment: none found Diet Prior to this Study: NPO Temperature Spikes Noted: No Respiratory Status: Room air History of Recent Intubation: No Behavior/Cognition: Alert;Cooperative;Pleasant mood;Lethargic/Drowsy Oral Cavity Assessment: Dry Oral Care Completed by SLP: Yes Oral Cavity - Dentition: Adequate natural dentition Self-Feeding Abilities: Total assist Patient Positioning: Upright in bed Baseline Vocal Quality: Low vocal intensity;Hoarse Volitional Cough: Weak Volitional Swallow: Able to elicit    Oral/Motor/Sensory Function Overall Oral Motor/Sensory Function: Within functional limits   Ice Chips Ice chips: Impaired Presentation: Spoon Oral Phase  Functional Implications: Prolonged oral transit Pharyngeal Phase Impairments: Suspected delayed Swallow   Thin Liquid Thin Liquid: Impaired Presentation: Straw;Cup Pharyngeal  Phase Impairments: Suspected delayed Swallow    Nectar Thick     Honey Thick     Puree Puree: Not tested   Solid     Solid: Not tested      Angela Nevin, MA, CCC-SLP Speech Therapy

## 2023-03-01 NOTE — Progress Notes (Signed)
Nutrition Follow-up  DOCUMENTATION CODES:   Severe malnutrition in context of acute illness/injury  INTERVENTION:  - Initiate below tube feeds via NGT (xray verified in stomach) Osmolite 1.5 at 65 ml/h (1560 ml per day *Start at 13mL/hr and advance by 101mL every 12 hours Provides 2340 kcal, 98 gm protein, 1189 ml free water daily   - Monitor magnesium, potassium, and phosphorus BID for at least 3 days, MD to replete as needed, as pt is at risk for refeeding syndrome given malnutrition with a 23# (14.6%) weight loss in 3 weeks and minimal oral intake ~1 month. - 100mg  thiamine x5 days due to risk of refeeding.   - Stopping D5 now that starting tube feeds.   - Monitor weight trends.    NUTRITION DIAGNOSIS:   Severe Malnutrition related to acute illness as evidenced by moderate fat depletion, moderate muscle depletion, energy intake < or equal to 50% for > or equal to 5 days, percent weight loss (14.6% in 3 weeks). *ongoing  GOAL:   Patient will meet greater than or equal to 90% of their needs *unmet, starting tube feeds  MONITOR:   Diet advancement, Labs, Weight trends, TF tolerance  REASON FOR ASSESSMENT:   Consult Enteral/tube feeding initiation and management  ASSESSMENT:   72 y.o. female with medical history significant for prior colon cancer diagnosed in 2003 s/p partial colectomy and chemotherapy, nonalcoholic decompensated cirrhosis s/p TIPS placement, recent wrist fracture (01/29/23) who presented to ED due to confusion for the past 2 days.  4/4 Admit 4/5 NPO, NGT placed 4/8 failed SLP eval  Patient has remained NPO since 4/5 due to not being alert enough for PO.  Noted to be a little more alert today but failed SLP eval this morning.   Plan to start tube feeds today as discussed with MD. Patient remains at VERY HIGH risk of refeeding syndrome. Recommend slow advancement with close monitoring of electrolytes and adding thiamine. MD on board with plan. Plan to  discontinue D5 now that starting TFs.  Plan also discussed with RN.   Medications reviewed and include: D5 at 87mL/hr (provides 306 kcals over 24 hours)  Labs reviewed:  -   Diet Order:   Diet Order             Diet NPO time specified Except for: Ice Chips, Other (See Comments)  Diet effective now                   EDUCATION NEEDS:  Education needs have been addressed  Skin:  Skin Assessment: Reviewed RN Assessment Skin Integrity Issues:: Other (Comment) Other: IAD - buttocks  Last BM:  4/8  Height:  Ht Readings from Last 1 Encounters:  02/26/23 5\' 4"  (1.626 m)   Weight:  Wt Readings from Last 1 Encounters:  02/26/23 61 kg    BMI:  Body mass index is 23.08 kg/m.  Estimated Nutritional Needs:  Kcal:  1840-3754 kcals Protein:  85-100 grams Fluid:  >/= 2.1L    Shelle Iron RD, LDN For contact information, refer to [facility].

## 2023-03-01 NOTE — Progress Notes (Signed)
[facility]                                                                              Katherine CotaMary Park, is a 72 y.o. female, DOB - 07/27/1951, JYN:[id number] Admit date - 02/25/2023    Outpatient Primary MD for the patient is [name], [name], NP  LOS - 4  days  No chief complaint on file.      Brief summary   Patient is a 72 year old female with history of colon ca status post partial colectomy, chemotherapy, history of liver cirrhosis felt secondary to previous chemotherapy with oxaliplatin [facility], hepatic encephalopathy presented to ED with confusion for 2 days.  Patient had broken her left wrist in March at that time, she was started on opioid-based analgesics which caused constipation.  Patient's dose of lactulose was increased from 45 cc 3 times daily to 60 cc 3 times daily.  Despite this, patient was not having BMs and had been incontinent of urine and stools.  She was noted to be more altered with nausea and vomiting in the past 2 days PTA and was found to have temp of 94 F at home. In ED, was found to be lethargic and hypothermic, ammonia level was elevated at 163    Assessment & Plan    Principal Problem:   Acute hepatic encephalopathy -Ammonia level was elevated at 163 on admission but now improved to 50 today -patient is much more alert and oriented and closer to her baseline per husband. -GI following. -Receiving lactulose via core track -Blood cultures negative, urine culture showed no growth  Active Problems: Hypoglycemia, nutrition -Patient had been too lethargic to hold oral intake, now she is much more alert and oriented -SLP evaluation and start diet per recommendations  Hypothermia -Likely due to #1 and poor p.o. intake, UA negative for UTI, blood cultures NTD  -Hypothermia resolved    Decompensated liver cirrhosis with portal HTN and gastric varices -Status post TIPS procedure in 2012, developed liver cirrhosis after  chemotherapy with oxaliplatin -GI following.     AKI (acute kidney injury) -Mild AKI on admission, creatinine 1.13, was 0.6 on 02/09/2023 -Resolved    Hypothyroidism -Continue Synthroid 75 mcg per tube daily    Hypertension -BP now improving, decrease midodrine to 5 mg twice daily    Hx of colon cancer, stage III - status post partial colectomy and chemotherapy, history of liver cirrhosis felt secondary to previous chemotherapy with oxaliplatin, s/p TIPS procedure     Anemia with thrombocytopenia -Likely chemotherapy induced, currently stable  UTI ruled out    Protein-calorie malnutrition, severe Nutrition Problem: Severe Malnutrition Etiology: acute illness Signs/Symptoms: moderate fat depletion, moderate muscle depletion, energy intake < or equal to 50% for > or equal to 5 days, percent weight loss (14.6% in 3 weeks) Percent weight loss: 14.6 % (in 3 weeks) Estimated body mass index is 23.08 kg/m as calculated from the following:   Height as of this encounter: 5\' 4"  (1.626 m).   Weight as of this encounter: 61 kg.  Code Status: Full code DVT Prophylaxis:  SCDs Start: 02/26/23 0035  Level of Care: Level of care: Stepdown Family Communication: Updated patient's husband at the bedside. Disposition Plan:      Remains inpatient appropriate: Improving, continue close monitoring   Procedures:    Consultants:     Antimicrobials:   Anti-infectives (From admission, onward)    Start     Dose/Rate Route Frequency Ordered Stop   02/26/23 2200  cefTRIAXone (ROCEPHIN) 2 g in sodium chloride 0.9 % 100 mL IVPB  Status:  Discontinued        2 g 200 mL/hr over 30 Minutes Intravenous Every 24 hours 02/26/23 0034 02/27/23 0953   02/25/23 2200  cefTRIAXone (ROCEPHIN) 1 g in sodium chloride 0.9 % 100 mL IVPB  Status:  Discontinued        1 g 200 mL/hr over 30 Minutes Intravenous Every 24 hours 02/25/23 2118 02/26/23 0034          Medications  Chlorhexidine Gluconate Cloth   6 each Topical Daily   lactulose  30 g Per Tube Q6H   levothyroxine  75 mcg Per Tube Q0600   midodrine  5 mg Per Tube TID WC   mouth rinse  15 mL Mouth Rinse 4 times per day      Subjective:   [name] was seen and examined today.  Much more alert and oriented today, husband at the bedside, able to tell her name, she is in the hospital, follows simple commands.  BP stable, no fevers.  No acute nausea or vomiting or abdominal pain  Objective:   Vitals:   03/01/23 0400 03/01/23 0500 03/01/23 0700 03/01/23 0745  BP: 136/63 123/78 139/63   Pulse: 64 77 78   Resp: 17 19 18    Temp:    (!) 97.1 F (36.2 C)  TempSrc:    Axillary  SpO2: 93% 93% 93%   Weight:      Height:        Intake/Output Summary (Last 24 hours) at 03/01/2023 0950 Last data filed at 03/01/2023 0700 Gross per 24 hour  Intake 1555.18 ml  Output 2700 ml  Net -1144.82 ml     Wt Readings from Last 3 Encounters:  02/26/23 61 kg  02/24/23 61.9 kg  02/04/23 71.2 kg     Exam General: Alert and oriented x 3, ill-appearing Cardiovascular: S1 S2 auscultated,  RRR Respiratory: Diminished breath sounds at the bases, no wheezing Gastrointestinal: Soft, nontender, nondistended, + bowel sounds Ext: no pedal edema bilaterally Neuro: no new FND's Psych: mentation improving    Data Reviewed:  I have personally reviewed following labs    CBC Lab Results  Component Value Date   WBC 6.4 03/01/2023   RBC 2.90 (L) 03/01/2023   HGB 10.2 (L) 03/01/2023   HCT 31.8 (L) 03/01/2023   MCV 109.7 (H) 03/01/2023   MCH 35.2 (H) 03/01/2023   PLT 106 (L) 03/01/2023   MCHC 32.1 03/01/2023   RDW 19.3 (H) 03/01/2023   LYMPHSABS 0.7 03/01/2023   MONOABS 0.4 03/01/2023   EOSABS 0.1 03/01/2023   BASOSABS 0.0 03/01/2023     Last metabolic panel Lab Results  Component Value Date   NA 145 03/01/2023   K 3.7 03/01/2023   CL 119 (H) 03/01/2023   CO2 19 (L) 03/01/2023   BUN 10 03/01/2023   CREATININE 0.63 03/01/2023    GLUCOSE 99 03/01/2023   GFRNONAA >60 03/01/2023   GFRAA >60 08/17/2020   CALCIUM 9.3 03/01/2023   PHOS 3.4 02/26/2023   PROT 6.5 03/01/2023  ALBUMIN 4.0 03/01/2023   BILITOT 2.9 (H) 03/01/2023   ALKPHOS 157 (H) 03/01/2023   AST 40 03/01/2023   ALT 28 03/01/2023   ANIONGAP 7 03/01/2023    CBG (last 3)  Recent Labs    02/28/23 1536 02/28/23 2006 03/01/23 0741  GLUCAP 102* 100* 120*      Coagulation Profile: Recent Labs  Lab 02/25/23 2025 02/27/23 1143 02/28/23 0305 03/01/23 0309  INR 1.5* 1.9* 2.1* 2.1*     Radiology Studies: I have personally reviewed the imaging studies  DG Abd 1 View  Result Date: 02/27/2023 CLINICAL DATA:  NG tube placement EXAM: ABDOMEN - 1 VIEW COMPARISON:  02/26/2023 FINDINGS: Tip of feeding tube is seen in the fundus of the stomach. Tip of NG tube is pointing cephalad. There is interval withdrawal of NG tube with shorter length of NG tube within the stomach in the current study in comparison with the examination of 02/26/2023. IMPRESSION: Tip of feeding tube is seen in the fundus of the stomach. Electronically Signed   By: Ernie Avena M.D.   On: 02/27/2023 19:19       Masiel Gentzler M.D. [facility] 03/01/2023, 9:50 AM  Available via Epic secure chat 7am-7pm After 7 pm, please refer to night coverage provider listed on amion.

## 2023-03-02 ENCOUNTER — Other Ambulatory Visit (HOSPITAL_COMMUNITY): Payer: Self-pay

## 2023-03-02 DIAGNOSIS — K7682 Hepatic encephalopathy: Secondary | ICD-10-CM | POA: Diagnosis not present

## 2023-03-02 DIAGNOSIS — N3001 Acute cystitis with hematuria: Secondary | ICD-10-CM | POA: Diagnosis not present

## 2023-03-02 DIAGNOSIS — N179 Acute kidney failure, unspecified: Secondary | ICD-10-CM | POA: Diagnosis not present

## 2023-03-02 DIAGNOSIS — I471 Supraventricular tachycardia, unspecified: Secondary | ICD-10-CM

## 2023-03-02 DIAGNOSIS — R4182 Altered mental status, unspecified: Secondary | ICD-10-CM | POA: Diagnosis not present

## 2023-03-02 DIAGNOSIS — R001 Bradycardia, unspecified: Secondary | ICD-10-CM

## 2023-03-02 DIAGNOSIS — I10 Essential (primary) hypertension: Secondary | ICD-10-CM

## 2023-03-02 DIAGNOSIS — K7460 Unspecified cirrhosis of liver: Secondary | ICD-10-CM | POA: Diagnosis not present

## 2023-03-02 DIAGNOSIS — E43 Unspecified severe protein-calorie malnutrition: Secondary | ICD-10-CM | POA: Diagnosis not present

## 2023-03-02 DIAGNOSIS — I8500 Esophageal varices without bleeding: Secondary | ICD-10-CM

## 2023-03-02 DIAGNOSIS — K766 Portal hypertension: Secondary | ICD-10-CM

## 2023-03-02 DIAGNOSIS — D649 Anemia, unspecified: Secondary | ICD-10-CM | POA: Diagnosis not present

## 2023-03-02 LAB — PROTIME-INR
INR: 2.1 — ABNORMAL HIGH (ref 0.8–1.2)
Prothrombin Time: 23.2 seconds — ABNORMAL HIGH (ref 11.4–15.2)

## 2023-03-02 LAB — BASIC METABOLIC PANEL
Anion gap: 4 — ABNORMAL LOW (ref 5–15)
BUN: 16 mg/dL (ref 8–23)
CO2: 22 mmol/L (ref 22–32)
Calcium: 9.1 mg/dL (ref 8.9–10.3)
Chloride: 115 mmol/L — ABNORMAL HIGH (ref 98–111)
Creatinine, Ser: 0.78 mg/dL (ref 0.44–1.00)
GFR, Estimated: >60 mL/min (ref >60)
Glucose, Bld: 91 mg/dL (ref 70–99)
Potassium: 3.9 mmol/L (ref 3.5–5.1)
Sodium: 141 mmol/L (ref 135–145)

## 2023-03-02 LAB — COMPREHENSIVE METABOLIC PANEL
ALT: 26 U/L (ref 0–44)
AST: 39 U/L (ref 15–41)
Albumin: 3.6 g/dL (ref 3.5–5.0)
Alkaline Phosphatase: 170 U/L — ABNORMAL HIGH (ref 38–126)
Anion gap: 7 (ref 5–15)
BUN: 13 mg/dL (ref 8–23)
CO2: 21 mmol/L — ABNORMAL LOW (ref 22–32)
Calcium: 9.5 mg/dL (ref 8.9–10.3)
Chloride: 115 mmol/L — ABNORMAL HIGH (ref 98–111)
Creatinine, Ser: 0.69 mg/dL (ref 0.44–1.00)
GFR, Estimated: >60 mL/min (ref >60)
Glucose, Bld: 89 mg/dL (ref 70–99)
Potassium: 3.7 mmol/L (ref 3.5–5.1)
Sodium: 143 mmol/L (ref 135–145)
Total Bilirubin: 2.6 mg/dL — ABNORMAL HIGH (ref 0.3–1.2)
Total Protein: 6.3 g/dL — ABNORMAL LOW (ref 6.5–8.1)

## 2023-03-02 LAB — MAGNESIUM
Magnesium: 1.9 mg/dL (ref 1.7–2.4)
Magnesium: 1.8 mg/dL (ref 1.7–2.4)
Magnesium: 1.9 mg/dL (ref 1.7–2.4)

## 2023-03-02 LAB — GLUCOSE, CAPILLARY
Glucose-Capillary: 83 mg/dL (ref 70–99)
Glucose-Capillary: 96 mg/dL (ref 70–99)
Glucose-Capillary: 76 mg/dL (ref 70–99)
Glucose-Capillary: 72 mg/dL (ref 70–99)
Glucose-Capillary: 90 mg/dL (ref 70–99)

## 2023-03-02 LAB — CBC
HCT: 30.5 % — ABNORMAL LOW (ref 36.0–46.0)
Hemoglobin: 9.8 g/dL — ABNORMAL LOW (ref 12.0–15.0)
MCH: 35.3 pg — ABNORMAL HIGH (ref 26.0–34.0)
MCHC: 32.1 g/dL (ref 30.0–36.0)
MCV: 109.7 fL — ABNORMAL HIGH (ref 80.0–100.0)
Platelets: 105 10*3/uL — ABNORMAL LOW (ref 150–400)
RBC: 2.78 MIL/uL — ABNORMAL LOW (ref 3.87–5.11)
RDW: 18.8 % — ABNORMAL HIGH (ref 11.5–15.5)
WBC: 4.4 10*3/uL (ref 4.0–10.5)
nRBC: 0 % (ref 0.0–0.2)

## 2023-03-02 LAB — AMMONIA: Ammonia: 45 umol/L — ABNORMAL HIGH (ref 9–35)

## 2023-03-02 LAB — PHOSPHORUS
Phosphorus: 2.8 mg/dL (ref 2.5–4.6)
Phosphorus: 3.1 mg/dL (ref 2.5–4.6)

## 2023-03-02 LAB — CULTURE, BLOOD (ROUTINE X 2): Culture: NO GROWTH

## 2023-03-02 LAB — MRSA NEXT GEN BY PCR, NASAL: MRSA by PCR Next Gen: NOT DETECTED

## 2023-03-02 MED ORDER — ENSURE ENLIVE PO LIQD
237.0000 mL | Freq: Three times a day (TID) | ORAL | Status: DC
  Administered 2023-03-02 – 2023-03-08 (×14): 237 mL via ORAL

## 2023-03-02 MED ORDER — ATROPINE SULFATE 1 MG/10ML IJ SOSY
PREFILLED_SYRINGE | INTRAMUSCULAR | Status: AC
  Administered 2023-03-02: 1 mg
  Filled 2023-03-02: qty 10

## 2023-03-02 MED ORDER — DEXTROSE 5 % IV SOLN: INTRAVENOUS | Status: DC

## 2023-03-02 MED ORDER — ENOXAPARIN SODIUM 40 MG/0.4ML IJ SOSY
40.0000 mg | PREFILLED_SYRINGE | INTRAMUSCULAR | Status: DC
  Administered 2023-03-02 – 2023-03-13 (×11): 40 mg via SUBCUTANEOUS
  Filled 2023-03-02 (×11): qty 0.4

## 2023-03-02 MED ORDER — ZINC SULFATE 220 (50 ZN) MG PO CAPS
220.0000 mg | ORAL_CAPSULE | Freq: Two times a day (BID) | ORAL | Status: DC
  Administered 2023-03-02 – 2023-03-05 (×7): 220 mg via ORAL
  Filled 2023-03-02 (×7): qty 1

## 2023-03-02 MED ORDER — MAGNESIUM SULFATE 2 GM/50ML IV SOLN
2.0000 g | Freq: Once | INTRAVENOUS | Status: AC
  Administered 2023-03-02: 2 g via INTRAVENOUS
  Filled 2023-03-02: qty 50

## 2023-03-02 MED ORDER — RIFAXIMIN 550 MG PO TABS
550.0000 mg | ORAL_TABLET | Freq: Two times a day (BID) | ORAL | Status: DC
  Administered 2023-03-02 – 2023-03-08 (×14): 550 mg via ORAL
  Filled 2023-03-02 (×16): qty 1

## 2023-03-02 NOTE — Progress Notes (Signed)
Pt has several runs of SVT. Dr. Isidoro Donning notified. Orders placed for labs. Consults to Cards

## 2023-03-02 NOTE — Progress Notes (Signed)
Pt has a run of svt. EKG obtained. Dr. Isidoro Donning notified. Pt asymptomatic.

## 2023-03-02 NOTE — Progress Notes (Addendum)
[facility]                                                                              Katherine CotaMary Park, is a 72 y.o. female, DOB - 07/29/1951, [id number] Admit date - 02/25/2023    Outpatient Primary MD for the patient is [name], Katherine M, NP  LOS - 5  days  No chief complaint on file.      Brief summary   Patient is a 72 year old female with history of colon ca status post partial colectomy, chemotherapy, history of liver cirrhosis felt secondary to previous chemotherapy with oxaliplatin [facility], hepatic encephalopathy presented to ED with confusion for 2 days.  Patient had broken her left wrist in March at that time, she was started on opioid-based analgesics which caused constipation.  Patient's dose of lactulose was increased from 45 cc 3 times daily to 60 cc 3 times daily.  Despite this, patient was not having BMs and had been incontinent of urine and stools.  She was noted to be more altered with nausea and vomiting in the past 2 days PTA and was found to have temp of 94 F at home. In ED, was found to be lethargic and hypothermic, ammonia level was elevated at 163    Assessment & Plan    Principal Problem:   Acute hepatic encephalopathy -Ammonia level was elevated at 163 on admission but now improved to 45 -Patient is much more alert and oriented now.   -Has been receiving lactulose via core track.  -Blood cultures negative, urine culture showed no growth -GI following, started on rifaximin, continue current lactulose.  Once taking p.o. consistently, can change to oral lactulose.,  Defer to GI  Active Problems: Hypoglycemia, nutrition -Much more alert and oriented now, SLP evaluation done -Started on dysphagia 3 mechanical soft diet with thin liquids  Hypothermia -Likely due to #1 and poor p.o. intake, UA negative for UTI, blood cultures NTD  -Hypothermia resolved    Decompensated liver cirrhosis with portal HTN and gastric  varices -Status post TIPS procedure in 2012, developed liver cirrhosis after chemotherapy with oxaliplatin -GI following.  SVT episodes with bradycardia, new  -Follow BMET, mag, EKG, 2D echo. With SVT bursts HR 130-140's -Cardiology consulted Addendum 4:00 PM  Patient now having episodes of bradycardia with pauses  HR lowest 34, given atropine 1mg  x1 -d/w cardiology, Dr Jacques NavyAcharya, [facility], may need temporary pacing   - D[facility] C[facility] for [facility], [facility].       AKI (acute kidney injury) -Mild AKI on admission, creatinine 1.13, was 0.6 on 02/09/2023 -Resolved    Hypothyroidism -Continue Synthroid 75 mcg per tube daily, will transition to p.o. once consistently taking oral diet -TSH 1.2 on 02/25/2023    Hypertension -Continue midodrine 5 mg twice daily, BP stable      Hx of colon cancer, stage III - status post partial colectomy and chemotherapy, history of liver cirrhosis felt secondary to previous chemotherapy with oxaliplatin, s/p TIPS procedure  Anemia with thrombocytopenia -Likely chemotherapy induced, currently stable  UTI ruled out    Protein-calorie malnutrition, severe Nutrition Problem: Severe Malnutrition Etiology: acute illness Signs/Symptoms: moderate fat depletion, moderate muscle depletion, energy intake < or equal to 50% for > or equal to 5 days, percent weight loss (14.6% in 3 weeks) Percent weight loss: 14.6 % (in 3 weeks) Estimated body mass index is 24.48 kg/Park as calculated from the following:   Height as of this encounter: 5\' 4"  (1.626 Park).   Weight as of this encounter: 64.7 kg.  Code Status: Full code DVT Prophylaxis:  SCDs Start: 02/26/23 0035   Level of Care: Level of care: Stepdown Family Communication: patient's husband at the bedside. Disposition Plan:      Remains inpatient appropriate: Improving, continue close monitoring   Procedures:     Consultants:   Gastroenterology  Antimicrobials:   Anti-infectives (From admission, onward)    Start     Dose/Rate Route Frequency Ordered Stop   03/02/23 1200  rifaximin (XIFAXAN) tablet 550 mg        550 mg Oral 2 times daily 03/02/23 1039     02/26/23 2200  cefTRIAXone (ROCEPHIN) 2 g in sodium chloride 0.9 % 100 mL IVPB  Status:  Discontinued        2 g 200 mL/hr over 30 Minutes Intravenous Every 24 hours 02/26/23 0034 02/27/23 0953   02/25/23 2200  cefTRIAXone (ROCEPHIN) 1 g in sodium chloride 0.9 % 100 mL IVPB  Status:  Discontinued        1 g 200 mL/hr over 30 Minutes Intravenous Every 24 hours 02/25/23 2118 02/26/23 0034          Medications  Chlorhexidine Gluconate Cloth  6 each Topical Daily   lactulose  30 g Per Tube Q6H   levothyroxine  75 mcg Per Tube Q0600   midodrine  5 mg Per Tube BID WC   mouth rinse  15 mL Mouth Rinse 4 times per day   rifaximin  550 mg Oral BID   thiamine  100 mg Per Tube Daily   zinc sulfate  220 mg Oral BID      Subjective:   [name] was seen and examined today.  Much more alert and oriented today.  Following commands.  Cortrack +.   No acute complaints.  BP stable, no fevers or chills, nausea or vomiting.  No abdominal pain.   Objective:   Vitals:   03/02/23 1210 03/02/23 1220 03/02/23 1230 03/02/23 1240  BP:  (!) 106/48 (!) 115/51   Pulse: (!) 59 (!) 59 (!) 58 (!) 57  Resp: 14 16 17 18   Temp:      TempSrc:      SpO2: 96% 96% 96% 95%  Weight:      Height:        Intake/Output Summary (Last 24 hours) at 03/02/2023 1359 Last data filed at 03/02/2023 1245 Gross per 24 hour  Intake 1309.66 ml  Output 450 ml  Net 859.66 ml     Wt Readings from Last 3 Encounters:  03/02/23 64.7 kg  02/24/23 61.9 kg  02/04/23 71.2 kg   Physical Exam General: Alert and oriented x 3, NAD Cardiovascular: S1 S2 clear, RRR.  Respiratory: CTAB, no wheezing Gastrointestinal: Soft, nontender, nondistended, NBS Ext: no pedal edema  bilaterally Neuro: no new deficits Psych: Normal affect     Data Reviewed:  I have personally reviewed following labs    CBC Lab Results  Component Value Date  WBC 4.4 03/02/2023   RBC 2.78 (L) 03/02/2023   HGB 9.8 (L) 03/02/2023   HCT 30.5 (L) 03/02/2023   MCV 109.7 (H) 03/02/2023   MCH 35.3 (H) 03/02/2023   PLT 105 (L) 03/02/2023   MCHC 32.1 03/02/2023   RDW 18.8 (H) 03/02/2023   LYMPHSABS 0.7 03/01/2023   MONOABS 0.4 03/01/2023   EOSABS 0.1 03/01/2023   BASOSABS 0.0 03/01/2023     Last metabolic panel Lab Results  Component Value Date   NA 143 03/02/2023   K 3.7 03/02/2023   CL 115 (H) 03/02/2023   CO2 21 (L) 03/02/2023   BUN 13 03/02/2023   CREATININE 0.69 03/02/2023   GLUCOSE 89 03/02/2023   GFRNONAA >60 03/02/2023   GFRAA >60 08/17/2020   CALCIUM 9.5 03/02/2023   PHOS 2.8 03/02/2023   PROT 6.3 (L) 03/02/2023   ALBUMIN 3.6 03/02/2023   BILITOT 2.6 (H) 03/02/2023   ALKPHOS 170 (H) 03/02/2023   AST 39 03/02/2023   ALT 26 03/02/2023   ANIONGAP 7 03/02/2023    CBG (last 3)  Recent Labs    03/02/23 0308 03/02/23 0808 03/02/23 1130  GLUCAP 83 96 76      Coagulation Profile: Recent Labs  Lab 02/25/23 2025 02/27/23 1143 02/28/23 0305 03/01/23 0309 03/02/23 0438  INR 1.5* 1.9* 2.1* 2.1* 2.1*     Radiology Studies: I have personally reviewed the imaging studies  No results found.     Thad Ranger Park.D. [facility] 03/02/2023, 1:59 PM  Available via Epic secure chat 7am-7pm After 7 pm, please refer to night coverage provider listed on amion.

## 2023-03-02 NOTE — Progress Notes (Signed)
Pt had a run of SVT. Asymptomatic. EKG done. Dr. Isidoro Donning notified.

## 2023-03-02 NOTE — Progress Notes (Addendum)
HR got as low as 34. Dr. Isidoro Donning notified. Give Atropine per verbal from Dr. Isidoro Donning. Atropine given. Pt drowsy but axox4.

## 2023-03-02 NOTE — Consult Note (Signed)
NAME:  [name], MRN:  [id number], DOB:  1951/11/17, LOS: 5 ADMISSION DATE:  02/25/2023, CONSULTATION DATE: 03/02/2023 REFERRING MD:  Isidoro Donning, Ripudeep K, , CHIEF COMPLAINT: Bradycardia  History of Present Illness:  72 year old female with colon cancer s/p chemotherapy, complicated with liver cirrhosis, requiring TIPS in 2012, further complicated with hepatic encephalopathy in the setting of opiate medication.  She was admitted to hospitalist service with acute hepatic encephalopathy, initially ammonia level was elevated to 163.  Sepsis workup has been negative, she received lactulose and rifaximin with improvement in her mental status and her ammonia level dropped down to 45.  Today patient went into SVT with heart rate ranging in 130s-140s, she did not require any treatment but followed by patient became bradycardic as low as 30s, requiring atropine.  Cardiology was consulted, recommend transferring to ICU for possible transcutaneous pacing.  PCCM was consulted for help evaluation medical management Patient denies shortness of breath, cough, dizziness, headache, fever, chills or other complaints  Pertinent  Medical History   Past Medical History:  Diagnosis Date   Acute urinary retention 05/04/2022   Allergy 2006 ?   Contrast dye   Arthritis 2016 ??   Knees and thumb   Cancer    cecum   Cataract 2021   Surgery scheduled July 2023   Colon cancer 2003   Elevated liver function tests    Esophageal varices    Heart murmur On file   Hemorrhage of gastrointestinal tract 05/04/2011   Hypertension 2021   Hypothyroidism    Iron deficiency anemia    Liver disease    chemotherapy complication, per pt, shunts placed to bypass liver   Malignant neoplasm of cecum    Portal hypertension    Skin cancer 2019   Splenomegaly      Significant Hospital Events: Including procedures, antibiotic start and stop dates in addition to other pertinent events     Interim History / Subjective:   Transferred to ICU  Objective   Blood pressure (!) 111/47, pulse 61, temperature 98.4 F (36.9 C), temperature source Oral, resp. rate 14, height 5\' 4"  (1.626 m), weight 64.7 kg, SpO2 93 %.        Intake/Output Summary (Last 24 hours) at 03/02/2023 1835 Last data filed at 03/02/2023 1651 Gross per 24 hour  Intake 963.87 ml  Output 300 ml  Net 663.87 ml   Filed Weights   02/26/23 0031 03/02/23 0500  Weight: 61 kg 64.7 kg    Examination: Physical exam: General: Acute on chronically ill-appearing female, lying on the bed HEENT: [place]/AT, eyes anicteric.  moist mucus membranes Neuro: Alert, awake following commands Chest: Coarse breath sounds, no wheezes or rhonchi Heart: Regular rate and rhythm, no murmurs or gallops Abdomen: Soft, nontender, nondistended, bowel sounds present Skin: No rash  Labs and images were reviewed  Resolved Hospital Problem list   Hypokalemia Acute kidney injury  Assessment & Plan:  SVT followed by profound bradycardia, unclear etiology Patient went into SVT this afternoon, electrolytes were sent, echocardiogram was ordered Telemetry monitoring per note showed heart rate in 130s-140s Half an hour after that episode patient had few episodes of bradycardia with pauses Her heart rate went down to 34 but she remained asymptomatic, was given 1 mg of atropine with improvement in the heart rate Currently her heart rate is around 60s Continue telemetry monitoring Keep transcutaneous pacer wire at bedside Keep potassium > 4, magnesium >2  Decompensated liver cirrhosis with hepatic encephalopathy S/p TIPS Patient with  history of liver cirrhosis in the setting of chemotherapy She underwent TIPS in 2012 at [facility] Was admitted with acute hepatic encephalopathy with ammonia level was 163 She has received lactulose and rifaximin with improvement in her mental status and serum ammonia level Continue rifaximin  Anemia/thrombocytopenia of liver disease Patient's H&H  is stable around 9-10, platelet count dropped close to 100 Monitor H&H and platelet count, transfuse if hemoglobin less than 7 and platelet count less than 20  Severe protein calorie malnutrition Continue dietary supplements  Prior history of colon cancer status post resection and chemotherapy Outpatient follow-up with oncology   Best Practice (right click and "Reselect all SmartList Selections" daily)   Diet/type: tubefeeds DVT prophylaxis: LMWH GI prophylaxis: N/A Lines: N/A Foley:  N/A Code Status:  full code Last date of multidisciplinary goals of care discussion [pending]  Labs   CBC: Recent Labs  Lab 02/25/23 2025 02/26/23 0214 02/27/23 0254 02/28/23 0305 03/01/23 0309 03/02/23 0438  WBC 4.1 3.6* 3.0* 2.3* 6.4 4.4  NEUTROABS 2.7  --  1.6* 1.3* 5.1  --   HGB 12.1 10.6* 9.2* 9.4* 10.2* 9.8*  HCT 36.4 32.1* 28.5* 29.7* 31.8* 30.5*  MCV 102.8* 105.9* 106.7* 110.4* 109.7* 109.7*  PLT 142* 109* 101* 78* 106* 105*    Basic Metabolic Panel: Recent Labs  Lab 02/26/23 0214 02/27/23 0254 02/27/23 0343 02/28/23 0305 03/01/23 0309 03/01/23 1259 03/01/23 1705 03/02/23 0438 03/02/23 1501 03/02/23 1709  NA 139 144  --  148* 145  --   --  143 141  --   K 3.4* 2.9*  --  4.4 3.7  --   --  3.7 3.9  --   CL 104 112*  --  120* 119*  --   --  115* 115*  --   CO2 25 25  --  23 19*  --   --  21* 22  --   GLUCOSE 63* 97  --  114* 99  --   --  89 91  --   BUN 33* 24*  --  15 10  --   --  13 16  --   CREATININE 0.98 1.00  --  0.72 0.63  --   --  0.69 0.78  --   CALCIUM 8.9 9.1  --  9.6 9.3  --   --  9.5 9.1  --   MG 1.7  --    < >  --   --  1.9 1.7 1.9 1.8 1.9  PHOS 3.4  --   --   --   --  2.5 2.7 2.8  --  3.1   < > = values in this interval not displayed.   GFR: Estimated Creatinine Clearance: 55.7 mL/min (by C-G formula based on SCr of 0.78 mg/dL). Recent Labs  Lab 02/26/23 0948 02/27/23 0254 02/28/23 0305 03/01/23 0309 03/02/23 0438  WBC  --  3.0* 2.3* 6.4 4.4   LATICACIDVEN 1.3  --   --   --   --     Liver Function Tests: Recent Labs  Lab 02/26/23 0214 02/27/23 0254 02/28/23 0305 03/01/23 0309 03/02/23 0438  AST 70* 53* 41 40 39  ALT 46* 36 28 28 26   ALKPHOS 227* 184* 144* 157* 170*  BILITOT 1.6* 1.4* 2.2* 2.9* 2.6*  PROT 5.9* 6.1* 6.9 6.5 6.3*  ALBUMIN 2.5* 3.3* 4.5 4.0 3.6   No results for input(s): "LIPASE", "AMYLASE" in the last 168 hours. Recent Labs  Lab 02/26/23 [phone] 02/27/23 0254  02/28/23 0305 03/01/23 0309 03/02/23 0438  AMMONIA 163* 54* 66* 50* 45*    ABG    Component Value Date/Time   PHART 7.48 (H) 04/30/2022 1600   PCO2ART 41 04/30/2022 1600   PO2ART 105 04/30/2022 1600   HCO3 30.5 (H) 04/30/2022 1600   TCO2 17.6 12/19/2010 2150   ACIDBASEDEF 2.0 12/19/2010 2150   O2SAT 98.9 04/30/2022 1600     Coagulation Profile: Recent Labs  Lab 02/25/23 2025 02/27/23 1143 02/28/23 0305 03/01/23 0309 03/02/23 0438  INR 1.5* 1.9* 2.1* 2.1* 2.1*    Cardiac Enzymes: No results for input(s): "CKTOTAL", "CKMB", "CKMBINDEX", "TROPONINI" in the last 168 hours.  HbA1C: Hgb A1c MFr Bld  Date/Time Value Ref Range Status  08/14/2020 07:45 AM 5.1 4.8 - 5.6 % Final    Comment:    (NOTE) Pre diabetes:          5.7%-6.4%  Diabetes:              >6.4%  Glycemic control for   <7.0% adults with diabetes     CBG: Recent Labs  Lab 03/01/23 2258 03/02/23 0308 03/02/23 0808 03/02/23 1130 03/02/23 1515  GLUCAP 94 83 96 76 72    Review of Systems:   12 point review of system is significant for complaint mentioned HPI, rest is negative  Past Medical History:  She,  has a past medical history of Acute urinary retention (05/04/2022), Allergy (2006 ?), Arthritis (2016 ??), Cancer, Cataract (2021), Colon cancer (2003), Elevated liver function tests, Esophageal varices, Heart murmur (On file), Hemorrhage of gastrointestinal tract (05/04/2011), Hypertension (2021), Hypothyroidism, Iron deficiency anemia, Liver disease,  Malignant neoplasm of cecum, Portal hypertension, Skin cancer (2019), and Splenomegaly.   Surgical History:   Past Surgical History:  Procedure Laterality Date   COLON SURGERY  2004   Cancer   COSMETIC SURGERY  2021   Skin cancer   ESOPHAGEAL VARICE LIGATION     EYE SURGERY     HEMICOLECTOMY  01/08/2003   IR RADIOLOGIST EVAL & MGMT  12/20/2020   IR RADIOLOGIST EVAL & MGMT  05/29/2021   LIVER SURGERY     shunts placed after chemo complication   SKIN FULL THICKNESS GRAFT N/A 09/12/2019   Procedure: debridement and FTSG to the nose from left upper arm;  Surgeon: Allena Napoleon, MD;  Location: [facility];  Service: Plastics;  Laterality: N/A;  2 hours, please   TIPS PROCEDURE       Social History:   reports that she has never smoked. She has never used smokeless tobacco. She reports that she does not drink alcohol and does not use drugs.   Family History:  Her family history includes Arthritis in her father and mother; Cancer in her maternal aunt; Diabetes in her father; Hearing loss in her mother; Heart disease in her father and mother; Hypertension in her mother; Miscarriages / [place] in her mother. There is no history of Breast cancer, Colon cancer, Esophageal cancer, Pancreatic cancer, or Stomach cancer.   Allergies Allergies  Allergen Reactions   Contrast Media [Iodinated Contrast Media] Hives     Home Medications  Prior to Admission medications   Medication Sig Start Date End Date Taking? Authorizing Provider  acidophilus (RISAQUAD) CAPS capsule Take 1 capsule by mouth daily.   Yes [provider]  Cranberry 50 MG CHEW Chew 50 mg by mouth 2 (two) times daily.   Yes [provider]  doxycycline (VIBRAMYCIN) 50 MG capsule Take 50 mg  by mouth daily. 12/17/22  Yes [provider]  estradiol (ESTRACE) 0.1 MG/GM vaginal cream Place 1 Applicatorful vaginally every other day. 11/09/22  Yes [provider]  furosemide (LASIX)  20 MG tablet Take 2 tablets (40 mg total) by mouth daily. Hold until you see your liver doctor Patient taking differently: Take 40 mg by mouth daily. 05/05/22 05/05/23 Yes Arnetha CourserAmin, Sumayya, MD  lactulose, encephalopathy, (CHRONULAC) 10 GM/15ML SOLN Take 60 mLs by mouth 3 (three) times daily. 02/08/23 02/08/24 Yes [provider]  levOCARNitine (CARNITOR) 330 MG tablet Take 330 mg by mouth 3 (three) times daily. 05/23/22  Yes [provider]  Multiple Vitamin (MULTI-VITAMIN) tablet Take 1 tablet by mouth daily.   Yes [provider]  spironolactone (ALDACTONE) 50 MG tablet Take 0.5 tablets (25 mg total) by mouth daily. 11/05/22 11/05/23 Yes Joaquim Namuncan, Graham S, MD  SYNTHROID 75 MCG tablet Take 1 tablet (75 mcg total) by mouth daily before breakfast. Must be seen in office for more refills. Patient taking differently: Take 75 mcg by mouth daily before breakfast. 12/03/22  Yes Worthy RancherWebb, Padonda B, FNP  zinc gluconate 50 MG tablet Take 50 mg by mouth 3 (three) times daily.   Yes [provider]  albuterol (VENTOLIN HFA) 108 (90 Base) MCG/ACT inhaler Inhale 1-2 puffs into the lungs every 6 (six) hours as needed for wheezing or shortness of breath. Patient not taking: Reported on 02/26/2023 11/14/22   Tomi BambergerMyers, Rebecca F, PA-C  nitrofurantoin, macrocrystal-monohydrate, (MACROBID) 100 MG capsule Take 1 capsule (100 mg total) by mouth 2 (two) times daily. Patient not taking: Reported on 02/26/2023 11/05/22   Joaquim Namuncan, Graham S, MD     Critical care time:     This patient is critically ill with multiple organ system failure which requires frequent high complexity decision making, assessment, support, evaluation, and titration of therapies. This was completed through the application of advanced monitoring technologies and extensive interpretation of multiple databases.  During this encounter critical care time was devoted to patient care services described in this note for 37 minutes.    Cheri FowlerSudham  Michaila Kenney, MD [facility] See Amion for pager If no response to pager, please call [phone] until 7pm After 7pm, Please call E-link [phone]

## 2023-03-02 NOTE — Consult Note (Addendum)
Cardiology Consultation   Patient ID: [name] MRN: [id number]; DOB: 09-24-1951  Admit date: 02/25/2023 Date of Consult: 03/02/2023  PCP:  Eden Emms, NP   [facility] Providers Cardiologist:  New (Dr. Izora Ribas)  Patient Profile:   [name] is a 72 y.o. female with a history of hypertension, non-alcohol cirrhosis with esophageal varcies s/p TIPS in 2012 at [facility], hepatic encephalopathy on Lactulose, hypothyroidism, recurrent UTIs, and colon cancer diagnosed in 2003 s/p partial colectomy and chemotherapy who is being seen 03/02/2023 for the evaluation of SVT and bradycardia at the request of Dr. Isidoro Donning.  History of Present Illness:   [name] is a 72 year old female with the above history. No prior cardiac history. She does have a history of non-alcoholic decompensated cirrhosis felt to be secondary to Oxaliplatin which was used for treatment of her colon cancer.  Per review of notes in Care Everywhere, she developed Oxaliplatin induced portal hypertension with esophageal varices and had a variceal hemorrhage in 2012 requiring TIPS procedure at [facility]. She developed hepatic encephalopathy following a COVID-19 infection in 07/2020 and has had multiple hospitalization for hepatic encephalopathy since then. She is on lactulose at home. She follows with the Transplant team at [facility].  Patient was admitted to [facility] on 02/26/2023 for acute hepatic encephalopathy after presenting with altered mental status and hypothermia with temp of 33F at home. Ammonia level was elevated at 163. She has been receiving Lactulose via core track with improvement in mental status. Today, patient started having brief runs of SVT with rates in the 130s to 140s. Cardiology was consulted with plans to see tomorrow. However, she then started having bradycardia with rates in the 30s. She was given one dose of Atropine and decision was made to transfer her to [facility]  ICU tonight.  At the time of this evaluation, patient is resting comfortably in no acute distress. She is a frail woman who appear chronically ill. She is currently in normal sinus rhythm with rates in the 60s. She still has some confusion. She is oriented to person and place but could not tell me the month or the year. When asked what brought her to the hospital, she states "I was a little wonky" and states she was having trouble making decisions. She states she is not aware of her confusion when this happens but her family and friends will tell her. She denies any cardiac symptoms. No chest pain, shortness of breath, orthopnea or PND. She states she does get edema with her cirrhosis but it is typically well controlled. No palpitations, lightheadedness, dizziness, or syncope. She denies any symptoms during episodes of SVT or bradycardia earlier today. She did fall and fracture her left wrist about 1 month ago and wrist is currently in a cast. It sounds like this was a mechanical fall (she states she tripped). She denies any recent fevers or illness. She denies any URI or GI symptoms. No abnormal bleeding in urine or stools.    Past Medical History:  Diagnosis Date   Acute urinary retention 05/04/2022   Allergy 2006 ?   Contrast dye   Arthritis 2016 ??   Knees and thumb   Cancer    cecum   Cataract 2021   Surgery scheduled July 2023   Colon cancer 2003   Elevated liver function tests    Esophageal varices    Heart murmur On file   Hemorrhage of gastrointestinal tract  05/04/2011   Hypertension 2021   Hypothyroidism    Iron deficiency anemia    Liver disease    chemotherapy complication, per pt, shunts placed to bypass liver   Malignant neoplasm of cecum    Portal hypertension    Skin cancer 2019   Splenomegaly     Past Surgical History:  Procedure Laterality Date   COLON SURGERY  2004   Cancer   COSMETIC SURGERY  2021   Skin cancer   ESOPHAGEAL VARICE LIGATION     EYE SURGERY      HEMICOLECTOMY  01/08/2003   IR RADIOLOGIST EVAL & MGMT  12/20/2020   IR RADIOLOGIST EVAL & MGMT  05/29/2021   LIVER SURGERY     shunts placed after chemo complication   SKIN FULL THICKNESS GRAFT N/A 09/12/2019   Procedure: debridement and FTSG to the nose from left upper arm;  Surgeon: Allena Napoleon, MD;  Location: [facility];  Service: Plastics;  Laterality: N/A;  2 hours, please   TIPS PROCEDURE       Home Medications:  Prior to Admission medications   Medication Sig Start Date End Date Taking? Authorizing Provider  acidophilus (RISAQUAD) CAPS capsule Take 1 capsule by mouth daily.   Yes [provider]  Cranberry 50 MG CHEW Chew 50 mg by mouth 2 (two) times daily.   Yes [provider]  doxycycline (VIBRAMYCIN) 50 MG capsule Take 50 mg by mouth daily. 12/17/22  Yes [provider]  estradiol (ESTRACE) 0.1 MG/GM vaginal cream Place 1 Applicatorful vaginally every other day. 11/09/22  Yes [provider]  furosemide (LASIX) 20 MG tablet Take 2 tablets (40 mg total) by mouth daily. Hold until you see your liver doctor Patient taking differently: Take 40 mg by mouth daily. 05/05/22 05/05/23 Yes Arnetha Courser, MD  lactulose, encephalopathy, (CHRONULAC) 10 GM/15ML SOLN Take 60 mLs by mouth 3 (three) times daily. 02/08/23 02/08/24 Yes [provider]  levOCARNitine (CARNITOR) 330 MG tablet Take 330 mg by mouth 3 (three) times daily. 05/23/22  Yes [provider]  Multiple Vitamin (MULTI-VITAMIN) tablet Take 1 tablet by mouth daily.   Yes [provider]  spironolactone (ALDACTONE) 50 MG tablet Take 0.5 tablets (25 mg total) by mouth daily. 11/05/22 11/05/23 Yes Joaquim Nam, MD  SYNTHROID 75 MCG tablet Take 1 tablet (75 mcg total) by mouth daily before breakfast. Must be seen in office for more refills. Patient taking differently: Take 75 mcg by mouth daily before breakfast. 12/03/22  Yes Worthy Rancher B, FNP  zinc  gluconate 50 MG tablet Take 50 mg by mouth 3 (three) times daily.   Yes [provider]  albuterol (VENTOLIN HFA) 108 (90 Base) MCG/ACT inhaler Inhale 1-2 puffs into the lungs every 6 (six) hours as needed for wheezing or shortness of breath. Patient not taking: Reported on 02/26/2023 11/14/22   Tomi Bamberger, PA-C  nitrofurantoin, macrocrystal-monohydrate, (MACROBID) 100 MG capsule Take 1 capsule (100 mg total) by mouth 2 (two) times daily. Patient not taking: Reported on 02/26/2023 11/05/22   Joaquim Nam, MD    Inpatient Medications: Scheduled Meds:  Chlorhexidine Gluconate Cloth  6 each Topical Daily   feeding supplement  237 mL Oral TID BM   lactulose  30 g Per Tube Q6H   levothyroxine  75 mcg Per Tube Q0600   midodrine  5 mg Per Tube BID WC   mouth rinse  15 mL Mouth Rinse 4 times per day  rifaximin  550 mg Oral BID   thiamine  100 mg Per Tube Daily   zinc sulfate  220 mg Oral BID   Continuous Infusions:  dextrose 50 mL/hr at 03/02/23 1651   PRN Meds: lip balm, liver oil-zinc oxide, ondansetron, mouth rinse, polyethylene glycol  Allergies:    Allergies  Allergen Reactions   Contrast Media [Iodinated Contrast Media] Hives    Social History:   Social History   Socioeconomic History   Marital status: Married    Spouse name: Animal nutritionist   Number of children: 2   Years of education: Not on file   Highest education level: Not on file  Occupational History   Occupation: Librarian  Tobacco Use   Smoking status: Never   Smokeless tobacco: Never   Tobacco comments:    Never smoked  Vaping Use   Vaping Use: Never used  Substance and Sexual Activity   Alcohol use: Never   Drug use: Never   Sexual activity: Not Currently    Partners: Male  Other Topics Concern   Not on file  Social History Narrative   12/04/20   From: the area   Living: with husband, [name] (1994)   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      Family: 2 children Designer, fashion/clothing and Optometrist - 2  grandchildren - nearby      Enjoys: read      Exercise: trying to get back to exercise   Diet: healthy, limits fast foods      Safety   Seat belts: Yes    Guns: Yes  and secure   Safe in relationships: Yes    Social Determinants of Health   Financial Resource Strain: Low Risk  (04/01/2022)   Overall Financial Resource Strain (CARDIA)    Difficulty of Paying Living Expenses: Not hard at all  Food Insecurity: No Food Insecurity (02/26/2023)   Hunger Vital Sign    Worried About Running Out of Food in the Last Year: Never true    Ran Out of Food in the Last Year: Never true  Transportation Needs: No Transportation Needs (02/26/2023)   PRAPARE - Administrator, Civil Service (Medical): No    Lack of Transportation (Non-Medical): No  Physical Activity: Insufficiently Active (04/01/2022)   Exercise Vital Sign    Days of Exercise per Week: 2 days    Minutes of Exercise per Session: 20 min  Stress: No Stress Concern Present (04/01/2022)   [employer] of Occupational Health - Occupational Stress Questionnaire    Feeling of Stress : Only a little  Social Connections: Moderately Integrated (04/01/2022)   Social Connection and Isolation Panel [NHANES]    Frequency of Communication with Friends and Family: More than three times a week    Frequency of Social Gatherings with Friends and Family: More than three times a week    Attends Religious Services: More than 4 times per year    Active Member of [employer] or Organizations: No    Attends Banker Meetings: Never    Marital Status: Married  Catering manager Violence: Not At Risk (02/26/2023)   Humiliation, Afraid, Rape, and Kick questionnaire    Fear of Current or Ex-Partner: No    Emotionally Abused: No    Physically Abused: No    Sexually Abused: No    Family History:   Family History  Problem Relation Age of Onset   Arthritis Mother    Hearing loss Mother    Heart disease Mother  Hypertension Mother     Miscarriages / [place]birth[place] Mother    Arthritis Father    Diabetes Father    Heart disease Father    Cancer Maternal Aunt    Breast cancer Neg Hx    Colon cancer Neg Hx    Esophageal cancer Neg Hx    Pancreatic cancer Neg Hx    Stomach cancer Neg Hx      ROS:  Please see the history of present illness.  Review of Systems  Constitutional:  Negative for chills and fever.  HENT:  Negative for congestion.   Respiratory:  Negative for shortness of breath.   Cardiovascular:  Negative for chest pain, palpitations, orthopnea, leg swelling and PND.  Gastrointestinal:  Negative for blood in stool, melena, nausea and vomiting.  Genitourinary:  Negative for hematuria.  Musculoskeletal:  Negative for myalgias.  Neurological:  Positive for tremors. Negative for dizziness and loss of consciousness.  Psychiatric/Behavioral:  Positive for memory loss (confused).     Physical Exam/Data:   Vitals:   03/02/23 1625 03/02/23 1630 03/02/23 1635 03/02/23 1700  BP: (!) 110/44 (!) 108/43  (!) 111/47  Pulse: 62 60 61 61  Resp: 15 15 14 14   Temp:      TempSrc:      SpO2: 92% 93% 91% 93%  Weight:      Height:        Intake/Output Summary (Last 24 hours) at 03/02/2023 1817 Last data filed at 03/02/2023 1651 Gross per 24 hour  Intake 963.87 ml  Output 300 ml  Net 663.87 ml      03/02/2023    5:00 AM 02/26/2023   12:31 AM 02/24/2023    8:09 AM  Last 3 Weights  Weight (lbs) 142 lb 10.2 oz 134 lb 7.7 oz 136 lb 8 oz  Weight (kg) 64.7 kg 61 kg 61.916 kg     Body mass index is 24.48 kg/m.  General: 72 y.o. thin cachetic Caucasian female resting comfortably in no acute distress. Appears chronically ill. HEENT: Normocephalic and atraumatic. Sclera clear.  Neck: Supple. No JVD. Heart: RRR. Distinct S1 and S2. Soft II/VI murmur heard loudest at right upper sternal border.  Lungs: No increased work of breathing. Diminished breath sounds in bases. No significant wheezes, rhonchi, or rales.  Abdomen: Soft,  non-distended, and non-tender to palpation.  MSK: Normal strength and tone for age. Extremities: No to trace lower extremity edema.    Skin: Warm and dry. Neuro: Alert and oriented x3. No focal deficits. Psych: Normal affect. Responds appropriately.  EKG:  The EKG was personally reviewed and demonstrates:  Sinus bradycardia, rate 56 bpm, with some underlying artifact/ baseline wandering but no acute ST/T changes.  Telemetry:  Telemetry was personally reviewed and demonstrates:  Normal sinus rhythm rates in the 60s.  Relevant CV Studies:  Limited Echo 08/16/2020: Impressions: 1. Left ventricular ejection fraction, by estimation, is 60 to 65%. The  left ventricle has normal function. The left ventricle has no regional  wall motion abnormalities. Left ventricular diastolic function could not  be evaluated.   2. Right ventricular systolic function is normal. The right ventricular  size is normal. There is normal pulmonary artery systolic pressure. The  estimated right ventricular systolic pressure is 19.3 mmHg.   3. The mitral valve is degenerative. No evidence of mitral valve  regurgitation. No evidence of mitral stenosis. Moderate mitral annular  calcification.   4. The aortic valve is tricuspid. Aortic valve regurgitation is not  visualized. No  aortic stenosis is present.   5. The inferior vena cava is normal in size with greater than 50%  respiratory variability, suggesting right atrial pressure of 3 mmHg.   Comparison(s): No significant change from prior study.    Laboratory Data:  High Sensitivity Troponin:  No results for input(s): "TROPONINIHS" in the last 720 hours.   Chemistry Recent Labs  Lab 03/01/23 0309 03/01/23 1259 03/02/23 0438 03/02/23 1501 03/02/23 1709  NA 145  --  143 141  --   K 3.7  --  3.7 3.9  --   CL 119*  --  115* 115*  --   CO2 19*  --  21* 22  --   GLUCOSE 99  --  89 91  --   BUN 10  --  13 16  --   CREATININE 0.63  --  0.69 0.78  --    CALCIUM 9.3  --  9.5 9.1  --   MG  --    < > 1.9 1.8 1.9  GFRNONAA >60  --  >60 >60  --   ANIONGAP 7  --  7 4*  --    < > = values in this interval not displayed.    Recent Labs  Lab 02/28/23 0305 03/01/23 0309 03/02/23 0438  PROT 6.9 6.5 6.3*  ALBUMIN 4.5 4.0 3.6  AST 41 40 39  ALT 28 28 26   ALKPHOS 144* 157* 170*  BILITOT 2.2* 2.9* 2.6*   Lipids No results for input(s): "CHOL", "TRIG", "HDL", "LABVLDL", "LDLCALC", "CHOLHDL" in the last 168 hours.  Hematology Recent Labs  Lab 02/28/23 0305 03/01/23 0309 03/02/23 0438  WBC 2.3* 6.4 4.4  RBC 2.69* 2.90* 2.78*  HGB 9.4* 10.2* 9.8*  HCT 29.7* 31.8* 30.5*  MCV 110.4* 109.7* 109.7*  MCH 34.9* 35.2* 35.3*  MCHC 31.6 32.1 32.1  RDW 19.8* 19.3* 18.8*  PLT 78* 106* 105*   Thyroid  Recent Labs  Lab 02/25/23 2036  TSH 1.260    BNPNo results for input(s): "BNP", "PROBNP" in the last 168 hours.  DDimer No results for input(s): "DDIMER" in the last 168 hours.   Radiology/Studies:  DG Abd 1 View  Result Date: 02/27/2023 CLINICAL DATA:  NG tube placement EXAM: ABDOMEN - 1 VIEW COMPARISON:  02/26/2023 FINDINGS: Tip of feeding tube is seen in the fundus of the stomach. Tip of NG tube is pointing cephalad. There is interval withdrawal of NG tube with shorter length of NG tube within the stomach in the current study in comparison with the examination of 02/26/2023. IMPRESSION: Tip of feeding tube is seen in the fundus of the stomach. Electronically Signed   By: Ernie Avena M.D.   On: 02/27/2023 19:19     Assessment and Plan:   SVT Bradycardia Patient admitted with acute hepatic encephalopathy. She reportedly was noted to have intermittent SVT today with rates as high as the 200s but also had an episode of bradycardia with rates in the 30s and received a dose of Atropine. It is unclear whether she had any change in her mental status during these episodes given her baseline confusion. We unfortunately have no documentation  of any of these episodes. They took place when she was at [facility] and unable to view telemetry from her time there. However, she is currently in normal sinus rhythm with rates in the 70s. Potassium 3.9 and Magnesium 1.9. Echo is pending. Will continue to monitor on telemetry for now. Will avoid AV nodal agents given  episode of bradycardia. If she has recurrent sustained SVT overnight, Dr. Izora Ribas recommended given a bolus of IV Amiodarone to see how she responds. She is not a good long term Amiodarone candidate with her cirrhosis but one time dose okay. It is unclear what her overall prognosis is but she appears very frail and cachetic. I don't know if she would be a candidate for PPM. Will continue to monitor on telemetry for now.  Otherwise, per primary team: - Acute on chronic hepatic encephalopathy - Liver cirrhosis s/p TIPS - Chronic anemia and thrombocytopenia - Hypothyroidism - Severe protein calorie malnutrition - History of colon cancer    Risk Assessment/Risk Scores:  { For questions or updates, please contact [facility] Please consult [email or web address] for contact info under    Signed, Corrin Parker, PA-C  03/02/2023 6:17 PM  Personally seen and examined. Agree with APP above with the following comments:  Briefly 73 yo F with colon adenocarcinoma s/p FOLFOX with Oxiplatin associated cirrhosis (MELD Score 18) who presented with hypothermia and AMS.  Today at [facility] found to have SVT and bradycardia.  Transfer to [facility] for further evaluation.  Patient notes no symptoms; she does not remember having issues with fast or slow heart rhythms but does remember everyone running around.  Per nursing report has paroxysms of SVT that resolved but with peak rate of 230. She was given atropine for a heart rate of 34.  She had otherwise been recovering: On admission had hepatic encephalopathy, but this has resolved.  She has a feeding tube but had advanced to thin liquid  diet.  Conversant during exam but focused on getting water to drink  Though it is unclear what her long term mortality is estimated at her [facility] NP (Mr. Toney Reil), and her GI NP (Ms. Elsie Stain- [facility]) have been cautiously optimistic about her over all outcomes in prior notes.   No CP, SOB, or palpitations. We do not have the primary data from the fast or slow heart rates.  Last EKG sinus bradycardia rate 56 without other heart block.  She is in sinus rhythm on telemetry now.  Exam notable for  Thin, cachetic female, with asterixis. Hoarse voice with no murmurs.  CTAB.  Left arm is in a cast. NG tube present.  No LE edema No evidence of chemotherapy associated cardiomyopathy in 2021; and repeat echo is pending  Would recommend  - largely conservative therapy - she is at [facility] for potentially need for pacing - can repeat atropine over night; if this fails can start low dose dopamine -IC on call is aware of her case, we have discussed TVP  if needed; she is amenable to this if it was needed and consentable at time of evaluation - she is on midodrine, if having asymptomatic paroxysms of SVT would not pursue aggressive therapy, if symptomatic SVT or SVT leading to hypotension, could  attempt bolus if IV amiodarone (150 mg no drip); amiodarone would not be good long term therapy - I am more guarded about her 1 year mortality risk and am not sure she would meet PPM criteria - She is pending IV mag for hypomagnesemia  Riley Lam, MD FASE [facility] Cardiologist [facility]  [address] [place], [address] [place], [place] [address] [phone]  7:47 PM

## 2023-03-02 NOTE — Progress Notes (Signed)
Inpatient Rehab Admissions Coordinator:   I called to speak to pt's spouse, [name], regarding CIR recommendations.  I reviewed 3 hrs/day of therapy, physician follow up, and average length of stay 2 weeks (dependent upon progress) with goals of supervision.   Per spouse, no one has made him aware of any recommendations for f/u therapy and he states feeling caught off guard.  He asks if he can call me back, which is fine.  I will continue to follow.    Estill Dooms, PT, DPT Admissions Coordinator [phone] 03/02/23  2:03 PM

## 2023-03-02 NOTE — TOC Initial Note (Signed)
Transition of Care [facility]) - Initial/Assessment Note    Patient Details  Name: [name] MRN: [id number] Date of Birth: February 03, 1951  Transition of Care [facility]) CM/SW Contact:    Lavenia Atlas, RN Phone Number: 03/02/2023, 3:23 PM  Clinical Narrative:                 Per chart review patient from home w/spouse. PT has recommended AIR/CIR. This RNCM spoke with patient's husband Haasini Yao who reports he is not ready to make any discharge planning decisions. [name] reports patient had a good day and now having concerns with her heart. This RNCM explained discharge process and need to coordinate services, however TOC will continue to follow to assist with discharge planning as MD will make the decision when patient is medically stable. [name] agreed with process with this RNCM continue monitoring patient.   TOC will continue to follow.  Expected Discharge Plan: IP Rehab Facility Barriers to Discharge: Continued Medical Work up   Patient Goals and CMS Choice Patient states their goals for this hospitalization and ongoing recovery are:: return home CMS Medicare.gov Compare Post Acute Care list provided to:: Patient Represenative (must comment) [name] Perina (husband)) Choice offered to / list presented to : Spouse [place] ownership interest in [facility].provided to:: Spouse    Expected Discharge Plan and Services In-house Referral: NA Discharge Planning Services: CM Consult Post Acute Care Choice:  (unknown) Living arrangements for the past 2 months: Single Family Home                 DME Arranged: N/A DME Agency: NA       HH Arranged: NA HH Agency: NA        Prior Living Arrangements/Services Living arrangements for the past 2 months: Single Family Home Lives with:: Spouse Patient language and need for interpreter reviewed:: Yes Do you feel safe going back to the place where you live?: Yes      Need for Family Participation in Patient Care: Yes (Comment) Care  giver support system in place?: Yes (comment) Current home services: DME (walker, wheelchair, shower bendc, 3N1) Criminal Activity/Legal Involvement Pertinent to Current Situation/Hospitalization: No - Comment as needed  Activities of Daily Living Home Assistive Devices/Equipment: Dan Humphreys (specify type), Hospital bed, Cane (specify quad or straight) ADL Screening (condition at time of admission) Patient's cognitive ability adequate to safely complete daily activities?: No Is the patient deaf or have difficulty hearing?: No Does the patient have difficulty seeing, even when wearing glasses/contacts?: No Does the patient have difficulty concentrating, remembering, or making decisions?: Yes Patient able to express need for assistance with ADLs?: Yes Does the patient have difficulty dressing or bathing?: Yes Independently performs ADLs?: No Communication: Independent Dressing (OT): Needs assistance Is this a change from baseline?: Pre-admission baseline Grooming: Needs assistance Is this a change from baseline?: Pre-admission baseline Feeding: Independent Bathing: Needs assistance Is this a change from baseline?: Pre-admission baseline Toileting: Needs assistance Is this a change from baseline?: Pre-admission baseline In/Out Bed: Needs assistance Is this a change from baseline?: Pre-admission baseline Walks in Home: Needs assistance Is this a change from baseline?: Pre-admission baseline Does the patient have difficulty walking or climbing stairs?: Yes Weakness of Legs: Both Weakness of Arms/Hands: Both  Permission Sought/Granted Permission sought to share information with : Case Manager Permission granted to share information with : Yes, Verbal Permission Granted  Share Information with NAME: Case manager           Emotional  Assessment Appearance:: Appears stated age Attitude/Demeanor/Rapport: Gracious Affect (typically observed): Accepting Orientation: : Oriented to Self,  Oriented to Place, Oriented to  Time Alcohol / Substance Use: Not Applicable Psych Involvement: No (comment)  Admission diagnosis:  Hepatic encephalopathy [K76.82] Acute cystitis with hematuria [N30.01] Acute hepatic encephalopathy [K76.82] Patient Active Problem List   Diagnosis Date Noted   Protein-calorie malnutrition, severe 02/26/2023   Acute hepatic encephalopathy 02/25/2023   Weight loss 02/24/2023   Bowel habit changes 02/24/2023   Generalized abdominal pain 02/24/2023   Hospital discharge follow-up 02/24/2023   Fatigue 01/04/2023   Confusion 08/26/2022   Cystitis 08/26/2022   Frequent UTI 05/18/2022   Anemia 05/18/2022   Bacteremia 05/04/2022   Hypernatremia 05/04/2022   AKI (acute kidney injury) 04/30/2022   Sinus bradycardia 02/05/2022   Hepatic encephalopathy (HCC) 02/03/2022   Basal cell carcinoma (BCC) 05/06/2021   Cecal cancer 05/06/2021   Hypertension 05/06/2021   COVID-19 vaccination declined 01/30/2021   Decompensated liver cirrhosis with portal HTN and gastric varices 12/04/2020   Essential hypertension 08/12/2020   Murmur, cardiac 03/19/2020   Chronic pain of both knees 01/29/2020   Hypothyroidism 06/30/2019   History of basal cell cancer 06/30/2019   Hx of colon cancer, stage III 11/30/2011   Esophageal varices 06/12/2010   Portal hypertension 01/23/2008   PCP:  Eden Emms, NP Pharmacy:   [facility] - [place], [place] - [address] [place] [address] Phone: [phone] Fax: [phone]  [id number] - [place], [place] - [address] [place] [address] Phone: [phone] Fax: [phone]  [id number] - [place], [place] - [address] [place] [address] Phone: [phone] Fax: [phone]     Social Determinants of Health (SDOH) Social History: SDOH Screenings   Food Insecurity: No Food  Insecurity (02/26/2023)  Housing: Low Risk  (02/26/2023)  Transportation Needs: No Transportation Needs (02/26/2023)  Utilities: Not At Risk (02/26/2023)  Alcohol Screen: Low Risk  (04/01/2022)  Depression (PHQ2-9): Low Risk  (02/24/2023)  Financial Resource Strain: Low Risk  (04/01/2022)  Physical Activity: Insufficiently Active (04/01/2022)  Social Connections: Moderately Integrated (04/01/2022)  Stress: No Stress Concern Present (04/01/2022)  Tobacco Use: Low Risk  (02/25/2023)   SDOH Interventions:     Readmission Risk Interventions    03/02/2023    3:19 PM  Readmission Risk Prevention Plan  Transportation Screening Complete  PCP or Specialist Appt within 3-5 Days Complete  HRI or Home Care Consult Complete  Social Work Consult for Recovery Care Planning/Counseling Complete  Medication Review Oceanographer) Complete

## 2023-03-02 NOTE — Progress Notes (Addendum)
Patient ID: [name], female   DOB: Sep 18, 1951, 72 y.o.   MRN: [id number]    Progress Note   Subjective   Day # 5 CC; decompensated cirrhosis/altered mental status/severe hepatic encephalopathy  MELD-Na on admit=18  Tube feeds started yesterday  Labs-pro time 23.2/INR 2.1 WBC 4.4/hemoglobin 9.8/hematocrit 30.5/platelets 105 Ammonia 45 Potassium 3.7/BUN 13/creatinine 0.69  Patient much brighter and more alert today beaking better and starting to ask questions-no complaints of pain or any discomfort, tolerating tube feedings.  Husband says she has been eating a lot of ice chips and is hungry    Objective   Vital signs in last 24 hours: Temp:  [98 F (36.7 C)-99.1 F (37.3 C)] 98 F (36.7 C) (04/09 0800) Pulse Rate:  [55-73] 72 (04/09 0800) Resp:  [9-25] 19 (04/09 0800) BP: (103-141)/(46-78) 115/78 (04/09 0800) SpO2:  [93 %-96 %] 93 % (04/09 0800) Weight:  [64.7 kg] 64.7 kg (04/09 0500) Last BM Date : 03/01/23 (per report) General:   Older white female in NAD Heart:  Regular rate and rhythm; no murmurs Lungs: Respirations even and unlabored, lungs CTA bilaterally Abdomen:  Soft, nontender and nondistended. Normal bowel sounds. Extremities:  Without edema. Neurologic:  Alert and oriented, x 3 grossly normal neurologically-patient still a little slow but very appropriate and improving daily, no asterixis Psych:  Cooperative. Normal mood and affect.  Intake/Output from previous day: 04/08 0701 - 04/09 0700 In: 2747 [I.V.:1379.9; NG/GT:1217.2] Out: 600 [Urine:600] Intake/Output this shift: No intake/output data recorded.  Lab Results: Recent Labs    02/28/23 0305 03/01/23 0309 03/02/23 0438  WBC 2.3* 6.4 4.4  HGB 9.4* 10.2* 9.8*  HCT 29.7* 31.8* 30.5*  PLT 78* 106* 105*   BMET Recent Labs    02/28/23 0305 03/01/23 0309 03/02/23 0438  NA 148* 145 143  K 4.4 3.7 3.7  CL 120* 119* 115*  CO2 23 19* 21*  GLUCOSE 114* 99 89  BUN 15 10 13   CREATININE 0.72  0.63 0.69  CALCIUM 9.6 9.3 9.5   LFT Recent Labs    03/02/23 0438  PROT 6.3*  ALBUMIN 3.6  AST 39  ALT 26  ALKPHOS 170*  BILITOT 2.6*   PT/INR Recent Labs    03/01/23 0309 03/02/23 0438  LABPROT 23.7* 23.2*  INR 2.1* 2.1*         Assessment / Plan:    #2 72 year old white female with decompensated cirrhosis secondary to remote chemotherapy in setting of colon cancer.  Status post TIPS 2012 after variceal hemorrhage. M ELD 3.0=18 History of chronic hepatic encephalopathy generally managed just with lactulose-admitted with altered mental status and became obtunded within the first 12 hours of admission in setting of probable UTI and also significant constipation over the past 3 to 4 weeks with analgesics after wrist fracture Unable to obtain Xifaxan in the past due to cost  Patient is improving daily, more alert and starting to converse Panda tube in place receiving lactulose and tube feeds via Panda  #2 significant coagulopathy secondary to underlying cirrhosis INR has been on the rise, giving vitamin K-stable today  #3 hypokalemia resolved #4.  Anemia-stable #5 right wrist fracture-casted  Plan; will ask speech pathology to retest swallowing today hopefully diet can be advanced, then can discontinue tube feedings and pull the Panda tube  Continue current doses of lactulose Once able to take p.o.'s will add Xifaxan while here and will look into possibility of getting assistance as an outpatient Add zinc 220 mg p.o.  twice daily once taking p.o.'s Continue vitamin K      Principal Problem:   Acute hepatic encephalopathy Active Problems:   Hx of colon cancer, stage III   Hypothyroidism   Decompensated liver cirrhosis with portal HTN and gastric varices   Hypertension   AKI (acute kidney injury)   Anemia   Protein-calorie malnutrition, severe     LOS: 5 days   Amy Esterwood PA-C 03/02/2023, 8:54 AM    Attending physician's note   I have reviewed the  chart and examined the patient. I performed a substantive portion of this encounter, including complete performance of at least one of the key components, in conjunction with the APP. I agree with the Advanced Practitioner's note, impression and recommendations.   Acute on chronic hepatic encephalopathy-much improved.  Likely ppt by narcotics.  Decompensated liver cirrhosis (d/t oxaliplatin) s/p TIPS at [facility] 2012 after variceal bleeding. TIPS patent. H/O cecal adenoCa s/p resection 2004 with neoadjuvant chemo (oxaliplatin) SVT with episodes of bradycardia-being transferred to ICU @ [facility] for monitoring/pacer if needed  Plan: -Switch to p.o. lactulose. -Once able to take p.o.'s, add rifaximin 550BID, Zinc 220BID -No other GI recommendations at this time.  Will sign off for now.  Please call if with any ?Marland Kitchen   Edman Circle, MD [facility] [phone]

## 2023-03-02 NOTE — Progress Notes (Signed)
Speech Language Pathology Treatment: Dysphagia  Patient Details Name: [name] MRN: [id number] DOB: June 13, 1951 Today's Date: 03/02/2023 Time: 3614-4315 SLP Time Calculation (min) (ACUTE ONLY): 39 min  Assessment / Plan / Recommendation Clinical Impression  Patient seen to assess readiness for p.o. intake.  Today patient is alert sitting upright in chair with bowels, [name], present.  Per RN and spouse patient has been consuming ice chips and water.  P.o. trials included few bites of graham crackers, applesauce, nectar thick apple juice, and thin water.  Slight delay in swallowing noted with solids most notably.  3 ounces of water were consumed but not sequentially due to patient requiring rest break for breathing.  Eructation noted x 1 but patient denied having reflux at that time.  Spouse reports patient esophageal varices required extensive surgery at [facility] in the past but patient has managed fine at home.  She is not on a PPI and only occasionally has issues with refluxing/regurgitation.  Delayed responses noted with patient requiring hand overhand assistance to feed herself space (some due to weakness but also some due to NG tube in her way).  At this time recommend dysphagia 3 and thin diet with strict aspiration and esophageal precautions.  SLP will follow-up x 1 to ensure patient is managing p.o. and instrumental evaluation not indicated.  Suspect primary baseline dysphagia if present is due to known esophageal issues.  Educated patient and spouse to use precautions and diet recommendations.  As patient is currently weak with weak phonation that spouse reports has not been strong in the last 3 weeks, she may be at higher risk for pulmonary ramifications if she does aspirate.    HPI HPI: Patient is a 72 y.o. female with PMH: colon cancer s/p partial colectomy and chemotherapy, liver cirrhosis, hepatic encephalopathy. She recently broke her wrist and was given narcotic and became  constipated. She presented to the hospital on 02/25/23 with confusion, nausea and vomiting for two days prior. In ED she was lethargic and hypothermic, UA positive for pyuria, ammonia was elevated. She has been NPO due to being too lethargic for oral intake.      SLP Plan  Continue with current plan of care      Recommendations for follow up therapy are one component of a multi-disciplinary discharge planning process, led by the attending physician.  Recommendations may be updated based on patient status, additional functional criteria and insurance authorization.    Recommendations  Diet recommendations: Dysphagia 3 (mechanical soft);Thin liquid Liquids provided via: Straw;Cup Medication Administration: Other (Comment) (Consider whole with pure) Compensations: Slow rate;Small sips/bites Postural Changes and/or Swallow Maneuvers: Seated upright 90 degrees;Upright 30-60 min after meal                  Oral care BID;Staff/trained caregiver to provide oral care;Oral care prior to ice chip/H20     Dysphagia, unspecified (R13.10)     Continue with current plan of care   [name], MS [facility] SLP Acute Rehab Services Office [phone]   [name]  03/02/2023, 12:40 PM

## 2023-03-02 NOTE — Progress Notes (Signed)
Physical Therapy Treatment Patient Details Name: Lessie Ketchem MRN: [id number] DOB: 1951/02/04 Today's Date: 03/02/2023   History of Present Illness Patient is 72 year old female admitted on 02/25/23 with acute hepatic encephalopathy. Pt with fall in March 2023 that resulted in L wrist fx.  She was on opioids which caused constipation and even with increased lactulose she was not having bowel movements and developed encephalopathy and hypothermia.  Pt with hx including but not limited to colon CA, liver cirrhosis, TIPS, hepatic encephalopathy.    PT Comments    Patient's spouse present. Patient appears more alert and following directions with less time.  Patient able to stand in STEDY and remained standing to  transfer to recliner.   Patient had a  run of VTach prior to any mobility, RN aware.  BP 101/56 supine, 121/72 after transfer to recliner. HR remained in hi 50's. Patient will benefit from continued PT.  Recommendations for follow up therapy are one component of a multi-disciplinary discharge planning process, led by the attending physician.  Recommendations may be updated based on patient status, additional functional criteria and insurance authorization.  Follow Up Recommendations  Can patient physically be transported by private vehicle: No    Assistance Recommended at Discharge Frequent or constant Supervision/Assistance  Patient can return home with the following A lot of help with walking and/or transfers;A lot of help with bathing/dressing/bathroom   Equipment Recommendations  Wheelchair (measurements PT);Wheelchair cushion (measurements PT)    Recommendations for Other Services Rehab consult     Precautions / Restrictions Precautions Precautions: Fall Precaution Comments: has runs of Vtach,  Low BP , NG feeding tube    Mobility  Bed Mobility   Bed Mobility: Supine to Sit     Supine to sit: Mod assist, +2 for physical assistance     General bed mobility  comments: increased time and multimodal cues with assist for legs and trunk, max assist and bed pad to scoot to bed edge    Transfers Overall transfer level: Needs assistance   Transfers: Bed to chair/wheelchair/BSC, Sit to/from Stand Sit to Stand: +2 safety/equipment, Mod assist, From elevated surface           General transfer comment: patient able  to reach for STEDY horizontal bar with RUE and pull to stnad. patient remained in standing during transfer to recliner. Transfer via Lift Equipment: Stedy  Ambulation/Gait                   Stairs             Wheelchair Mobility    Modified Rankin (Stroke Patients Only)       Balance Overall balance assessment: Needs assistance Sitting-balance support: Single extremity supported, Feet supported Sitting balance-Leahy Scale: Fair Sitting balance - Comments: Gradually able to gain supervision  for sitting on bed edge   Standing balance support: Bilateral upper extremity supported Standing balance-Leahy Scale: Fair Standing balance comment: stands erect with both hands on stedy bar.                            Cognition Arousal/Alertness: Awake/alert Behavior During Therapy: Flat affect Overall Cognitive Status: Impaired/Different from baseline Area of Impairment: Orientation, Attention, Following commands, Awareness                 Orientation Level: Situation, Time Current Attention Level: Sustained   Following Commands: Follows one step commands with increased time Safety/Judgement: Decreased awareness  of safety, Decreased awareness of deficits Awareness: Emergent Problem Solving: Slow processing, Decreased initiation, Difficulty sequencing, Requires verbal cues, Requires tactile cues General Comments: per spouse, patient is more alert and aware, oriented to "[place]".        Exercises      General Comments        Pertinent Vitals/Pain Pain Assessment Pain Assessment:  No/denies pain    Home Living                          Prior Function            PT Goals (current goals can now be found in the care plan section) Progress towards PT goals: Progressing toward goals    Frequency    Min 3X/week      PT Plan Current plan remains appropriate    Co-evaluation              AM-PAC PT "6 Clicks" Mobility   Outcome Measure  Help needed turning from your back to your side while in a flat bed without using bedrails?: A Lot Help needed moving from lying on your back to sitting on the side of a flat bed without using bedrails?: A Lot Help needed moving to and from a bed to a chair (including a wheelchair)?: Total Help needed standing up from a chair using your arms (e.g., wheelchair or bedside chair)?: Total Help needed to walk in hospital room?: Total Help needed climbing 3-5 steps with a railing? : Total 6 Click Score: 8    End of Session Equipment Utilized During Treatment: Gait belt Activity Tolerance: Patient tolerated treatment well Patient left: in chair;with call bell/phone within reach;with family/visitor present;with chair alarm set Nurse Communication: Mobility status PT Visit Diagnosis: Other abnormalities of gait and mobility (R26.89);Muscle weakness (generalized) (M62.81)     Time: 3790-2409 PT Time Calculation (min) (ACUTE ONLY): 29 min  Charges:  $Therapeutic Activity: 23-37 mins                     Blanchard Kelch PT Acute Rehabilitation Services Office [phone] Weekend pager-[phone]    Rada Hay 03/02/2023, 12:02 PM

## 2023-03-02 NOTE — Progress Notes (Signed)
SLP Cancellation Note  Patient Details Name: [name] MRN: [id number] DOB: [birth year]   Cancelled treatment:       Reason Eval/Treat Not Completed:  (pt on [facility], will return)  [name], MS [facility] SLP Acute Rehab Services Office [phone]  [name] 03/02/2023, 9:30 AM

## 2023-03-03 ENCOUNTER — Inpatient Hospital Stay (HOSPITAL_COMMUNITY): Payer: Medicare Other

## 2023-03-03 DIAGNOSIS — N179 Acute kidney failure, unspecified: Secondary | ICD-10-CM | POA: Diagnosis not present

## 2023-03-03 DIAGNOSIS — K7682 Hepatic encephalopathy: Secondary | ICD-10-CM | POA: Diagnosis not present

## 2023-03-03 DIAGNOSIS — R012 Other cardiac sounds: Secondary | ICD-10-CM | POA: Diagnosis not present

## 2023-03-03 DIAGNOSIS — E43 Unspecified severe protein-calorie malnutrition: Secondary | ICD-10-CM | POA: Diagnosis not present

## 2023-03-03 DIAGNOSIS — R001 Bradycardia, unspecified: Secondary | ICD-10-CM | POA: Diagnosis not present

## 2023-03-03 LAB — CBC
HCT: 29.8 % — ABNORMAL LOW (ref 36.0–46.0)
Hemoglobin: 10.2 g/dL — ABNORMAL LOW (ref 12.0–15.0)
MCH: 35.9 pg — ABNORMAL HIGH (ref 26.0–34.0)
MCHC: 34.2 g/dL (ref 30.0–36.0)
MCV: 104.9 fL — ABNORMAL HIGH (ref 80.0–100.0)
Platelets: 127 10*3/uL — ABNORMAL LOW (ref 150–400)
RBC: 2.84 MIL/uL — ABNORMAL LOW (ref 3.87–5.11)
RDW: 18.5 % — ABNORMAL HIGH (ref 11.5–15.5)
WBC: 3.3 10*3/uL — ABNORMAL LOW (ref 4.0–10.5)
nRBC: 0 % (ref 0.0–0.2)

## 2023-03-03 LAB — COMPREHENSIVE METABOLIC PANEL
ALT: 26 U/L (ref 0–44)
AST: 40 U/L (ref 15–41)
Albumin: 3.3 g/dL — ABNORMAL LOW (ref 3.5–5.0)
Alkaline Phosphatase: 191 U/L — ABNORMAL HIGH (ref 38–126)
Anion gap: 5 (ref 5–15)
BUN: 14 mg/dL (ref 8–23)
CO2: 21 mmol/L — ABNORMAL LOW (ref 22–32)
Calcium: 9.1 mg/dL (ref 8.9–10.3)
Chloride: 112 mmol/L — ABNORMAL HIGH (ref 98–111)
Creatinine, Ser: 0.72 mg/dL (ref 0.44–1.00)
GFR, Estimated: >60 mL/min (ref >60)
Glucose, Bld: 93 mg/dL (ref 70–99)
Potassium: 3.7 mmol/L (ref 3.5–5.1)
Sodium: 138 mmol/L (ref 135–145)
Total Bilirubin: 2.8 mg/dL — ABNORMAL HIGH (ref 0.3–1.2)
Total Protein: 6.3 g/dL — ABNORMAL LOW (ref 6.5–8.1)

## 2023-03-03 LAB — MAGNESIUM
Magnesium: 2.4 mg/dL (ref 1.7–2.4)
Magnesium: 1.8 mg/dL (ref 1.7–2.4)

## 2023-03-03 LAB — ECHOCARDIOGRAM COMPLETE
Area-P 1/2: 2.42 cm2
Height: 64 in
S' Lateral: 3.4 cm
Weight: 2208.13 oz

## 2023-03-03 LAB — PROTIME-INR
INR: 1.8 — ABNORMAL HIGH (ref 0.8–1.2)
Prothrombin Time: 20.9 seconds — ABNORMAL HIGH (ref 11.4–15.2)

## 2023-03-03 LAB — AMMONIA: Ammonia: 76 umol/L — ABNORMAL HIGH (ref 9–35)

## 2023-03-03 LAB — C-REACTIVE PROTEIN: CRP: 1.4 mg/dL — ABNORMAL HIGH (ref <1.0)

## 2023-03-03 LAB — PHOSPHORUS
Phosphorus: 2.9 mg/dL (ref 2.5–4.6)
Phosphorus: 3 mg/dL (ref 2.5–4.6)

## 2023-03-03 LAB — GLUCOSE, CAPILLARY
Glucose-Capillary: 92 mg/dL (ref 70–99)
Glucose-Capillary: 88 mg/dL (ref 70–99)
Glucose-Capillary: 76 mg/dL (ref 70–99)
Glucose-Capillary: 71 mg/dL (ref 70–99)
Glucose-Capillary: 78 mg/dL (ref 70–99)

## 2023-03-03 LAB — CULTURE, BLOOD (ROUTINE X 2): Special Requests: ADEQUATE

## 2023-03-03 MED ORDER — PROSOURCE TF20 ENFIT COMPATIBL EN LIQD
60.0000 mL | Freq: Every day | ENTERAL | Status: DC
  Administered 2023-03-03 – 2023-03-05 (×2): 60 mL
  Filled 2023-03-03 (×2): qty 60

## 2023-03-03 MED ORDER — POTASSIUM CHLORIDE 20 MEQ PO PACK
40.0000 meq | PACK | Freq: Once | ORAL | Status: AC
  Administered 2023-03-03: 40 meq
  Filled 2023-03-03: qty 2

## 2023-03-03 MED ORDER — METOCLOPRAMIDE HCL 5 MG/ML IJ SOLN
10.0000 mg | Freq: Four times a day (QID) | INTRAMUSCULAR | Status: DC
  Administered 2023-03-03 – 2023-03-06 (×12): 10 mg via INTRAVENOUS
  Filled 2023-03-03 (×12): qty 2

## 2023-03-03 MED ORDER — OSMOLITE 1.5 CAL PO LIQD
1000.0000 mL | ORAL | Status: DC
  Administered 2023-03-03: 1000 mL
  Filled 2023-03-03 (×2): qty 1000

## 2023-03-03 NOTE — Progress Notes (Signed)
Attempted to call 2H unit for report, but not able to get an answer. Will attempt again.

## 2023-03-03 NOTE — Progress Notes (Signed)
[facility] ADULT ICU REPLACEMENT PROTOCOL   The patient does apply for the [facility] Adult ICU Electrolyte Replacment Protocol based on the criteria listed below:   1.Exclusion criteria: TCTS, ECMO, Dialysis, and Myasthenia Gravis patients 2. Is GFR >/= 30 ml/min? Yes.    Patient's GFR today is >60 3. Is SCr </= 2? Yes.   Patient's SCr is 0.72 mg/dL 4. Did SCr increase >/= 0.5 in 24 hours? No. 5.Pt's weight >40kg  Yes.   6. Abnormal electrolyte(s):   K 3.7  7. Electrolytes replaced per protocol 8.  Call MD STAT for K+ </= 2.5, Phos </= 1, or Mag </= 1 Physician:  Shawn Stall R Kechia Yahnke 03/03/2023 3:23 AM

## 2023-03-03 NOTE — Progress Notes (Signed)
Nutrition Follow-up  DOCUMENTATION CODES:   Severe malnutrition in context of acute illness/injury  INTERVENTION:   Ensure Enlive po TID, each supplement provides 350 kcal and 20 grams of protein.  Encourage po intake, with improved mental status hopefully can advance to Regular diet soon. Discussed with RN  Tube Feeding via 10 french Dobhoff: trial Nocturnal TF Osmolite 1.5 at 55 ml/hr x 12 hours (6pm to 6am) This provides 990 kcals, 41 g of protein and 500 mL of free water  Pro-Source TF20 60 mL daily per tube, provides additional 20 g of protein and 80 kcals  Noted pt on higher zinc supplementation; Zinc Sulfate BID. Recommend checking zinc and copper levels in addition to CRP  NUTRITION DIAGNOSIS:   Severe Malnutrition related to acute illness as evidenced by moderate fat depletion, moderate muscle depletion, energy intake < or equal to 50% for > or equal to 5 days, percent weight loss (14.6% in 3 weeks).  Being addressed via diet advancement  GOAL:   Patient will meet greater than or equal to 90% of their needs  Progressing  MONITOR:   Diet advancement, Labs, Weight trends, TF tolerance  REASON FOR ASSESSMENT:   Consult Enteral/tube feeding initiation and management  ASSESSMENT:   72 y.o. female with medical history significant for prior colon cancer diagnosed in 2003 s/p partial colectomy and chemotherapy, nonalcoholic decompensated cirrhosis s/p TIPS placement, recent wrist fracture (01/29/23) who presented to ED due to confusion for the past 2 days.  4/04 Admit 4/05 NPO, NGT placed 4/08 failed SLP eval, TF started at trickle 4/09 Diet advanced to Dysphagia 3, TF up to at least 30-40 ml/hr  Pt ate a little bit at breakfast this AM but sent items pt does not normally eat like pork sausage. Lunch tray arrived during RD visit. Pt drinking some Ensure and milk as well.   Small bore weighted NG tube placed at [facility] and in place on visit today. No active TF orders  currently. Per [place], TF started at low rate on Monday and rate up to around 40 ml/hr prior to transfer. Noted tube out a bit from initial placement. [name] also mentions pt had "extensive surgery in 2012 at [facility] where stomach was pulled up and reattached to shortened esophagus." In quick review of chart, RD notes TIPS procedure, esophageal ligation for varices, partial colectomy. Repeat abdominal xray obtained to confirm NG is in good position prior to use post transfer. Per radiology report, tube tip in stomach. RD reviewed abd xray picture and discussed results with Dr. Merrily Pew. Ok to use tube for now, plan to place bridle today  [name] reports pt had been doing well with regards to nutrition until fall with wrist fracture and indicates everything has gone down hill since,   Typical 24-intake prior to wrist fracture:  Breakfast: high protein cereal with milk OR cheese grits (unflavored protein powder added) or Premier Protein shake Lunch: Sandwich of [place] or chicken with fresh fruit Dinner: Protein (fish, chicken, [place]) with veggies and salad  Post fracture: pt dealt with significant constipation from pain meds that did not resolve after stopping pain meds. Pt even went to [facility] for enema/disimpaction which was successful at stool removal but constipation continued after this. During this time, appetite and po intake was significantly decreased, pt with wt loss as previously noted in RD assessment  Noted pt currently receiving Zinc Sulfate 220 mg BID (total of 100 mg of Zinc), home meds taking Zinc gluconate 50 mg TID (total of  150 mg Zinc). Unsure exactly how long pt has been on zinc supplementation. Per RN, this was ordered by GI MD. This is considered high dose Zinc Supplementation; long term high dose zinc supplementation can cause toxicity and can even cause copper deficiency. Recommend checking copper and Zinc levels with CRP  Labs: Creatinine wdl, potassium 3.7 (wdl), phosphorus 2.9 (wdl),  magnesium 2.4 (wdl), CBGs 76-92 Meds: reglan, lactulose, zinc sulfate BID, thiamine   Diet Order:   Diet Order             DIET DYS 3 Room service appropriate? Yes; Fluid consistency: Thin  Diet effective now                   EDUCATION NEEDS:   Education needs have been addressed  Skin:  Skin Assessment: Reviewed RN Assessment Skin Integrity Issues:: Other (Comment) Other: IAD - buttocks  Last BM:  4/10 type 6 large  Height:   Ht Readings from Last 1 Encounters:  03/02/23 5\' 4"  (1.626 m)    Weight:   Wt Readings from Last 1 Encounters:  03/03/23 62.6 kg    BMI:  Body mass index is 23.69 kg/m.  Estimated Nutritional Needs:   Kcal:  1900-2100 kcals  Protein:  90-110 g  Fluid:  >/= 1.9 L   Romelle Starcher MS, RDN, LDN, CNSC Registered Dietitian 3 Clinical Nutrition RD Pager and On-Call Pager Number Located in [place]

## 2023-03-03 NOTE — Progress Notes (Signed)
NAME:  [name], MRN:  [id number], DOB:  [birth year], LOS: 6 ADMISSION DATE:  02/25/2023, CONSULTATION DATE: 03/02/2023 REFERRING MD:  Isidoro Donning, Ripudeep K, , CHIEF COMPLAINT: Bradycardia  History of Present Illness:  72 year old female with colon cancer s/p chemotherapy, complicated with liver cirrhosis, requiring TIPS in 2012, further complicated with hepatic encephalopathy in the setting of opiate medication.  She was admitted to hospitalist service with acute hepatic encephalopathy, initially ammonia level was elevated to 163.  Sepsis workup has been negative, she received lactulose and rifaximin with improvement in her mental status and her ammonia level dropped down to 45.  Today patient went into SVT with heart rate ranging in 130s-140s, she did not require any treatment but followed by patient became bradycardic as low as 30s, requiring atropine.  Cardiology was consulted, recommend transferring to ICU for possible transcutaneous pacing.  PCCM was consulted for help evaluation medical management Patient denies shortness of breath, cough, dizziness, headache, fever, chills or other complaints  Pertinent  Medical History   Past Medical History:  Diagnosis Date   Acute urinary retention 05/04/2022   Allergy 2006 ?   Contrast dye   Arthritis 2016 ??   Knees and thumb   Cancer    cecum   Cataract 2021   Surgery scheduled July 2023   Colon cancer 2003   Elevated liver function tests    Esophageal varices    Heart murmur On file   Hemorrhage of gastrointestinal tract 05/04/2011   Hypertension 2021   Hypothyroidism    Iron deficiency anemia    Liver disease    chemotherapy complication, per pt, shunts placed to bypass liver   Malignant neoplasm of cecum    Portal hypertension    Skin cancer 2019   Splenomegaly    Significant Hospital Events: Including procedures, antibiotic start and stop dates in addition to other pertinent events     Interim History / Subjective:   Asymptomatic, remains SB mostly in 50-60's.  Mental status more clear today per husband.  No complaints per patient.   Objective   Blood pressure (!) 105/53, pulse 60, temperature 98.4 F (36.9 C), temperature source Oral, resp. rate 18, height 5\' 4"  (1.626 m), weight 62.6 kg, SpO2 96 %.        Intake/Output Summary (Last 24 hours) at 03/03/2023 1034 Last data filed at 03/03/2023 0900 Gross per 24 hour  Intake 609.22 ml  Output 390 ml  Net 219.22 ml   Filed Weights   03/02/23 0500 03/02/23 1835 03/03/23 0500  Weight: 64.7 kg 63.5 kg 62.6 kg    Examination: General:  Frail and chronically ill appearing older female sitting upright in bed in NAD, pleasant HEENT: MM pink/moist, cortrak Neuro: alert, oriented x 3, MAE, generalized weakness CV: rr, SB, no murmur PULM:  non labored, clear anteriorly, RA GI: soft, bs+, NT, foley Extremities: warm/dry, +1LE edema  Skin: no rashes  Labs reviewed> K 3.7, sCr 0.72, Mag 2.4, ammonia 76, WBC 3.3, H/H 10.2/ 29, plt 127  Resolved Hospital Problem list   Hypokalemia Acute kidney injury  Assessment & Plan:  SVT followed by profound bradycardia, unclear etiology - went into SVT 4/9 afternoon followed by intermittent episodes of bradycardia requiring atropine (unclear if symptomatic) into 30's, K 3.9, mag 1.8, cardiology consulted  - etiology remains unclear.  Family hx of PPM - echo pending - cont telemetry monitoring - stable overnight with HR SB 50-60's, asymptomatic, hemodynamically stable - recs from cardiology for conservative management  and monitoring - optimize electrolytes, K> 4, Mag> 2, s/p KCL replete today   Decompensated liver cirrhosis with hepatic encephalopathy, gastric varices  S/p TIPS Patient with history of liver cirrhosis in the setting of chemotherapy She underwent TIPS in 2012 at [facility] Was admitted with acute hepatic encephalopathy with ammonia level was 163 - ammonia up today but mental status is better - cont  rifaximin and lactulose> having more stools today - daily vit K per GI - [facility] following  Hx HTN - on midodrine since 4/5, continue 5mg  BID  Anemia/thrombocytopenia of liver disease - CBC stable.  Monitor for evidence of bleeding - transfuse Hgb <7, plts < 20  Severe protein calorie malnutrition Dysphagia  Generalized weakness - SLP following - RD following> to continue supplemental TF till PO intake is better - thiamine, zinc  - PT/ OT  Prior history of colon cancer status post resection and chemotherapy Outpatient follow-up with oncology  Overall, patient remains hemodynamically stable.  Will transfer to PCU and back to [facility] as of 4/11.  Patient and husband updated at bedside.   Best Practice (right click and "Reselect all SmartList Selections" daily)   Diet/type: tubefeeds and dysphagia 3 diet DVT prophylaxis: LMWH GI prophylaxis: N/A Lines: N/A Foley:  Yes, and it is no longer needed> remove, monitor for urinary retention Code Status:  full code Last date of multidisciplinary goals of care discussion [pending]  Labs   CBC: Recent Labs  Lab 02/25/23 2025 02/26/23 0214 02/27/23 0254 02/28/23 0305 03/01/23 0309 03/02/23 0438 03/03/23 0139  WBC 4.1   < > 3.0* 2.3* 6.4 4.4 3.3*  NEUTROABS 2.7  --  1.6* 1.3* 5.1  --   --   HGB 12.1   < > 9.2* 9.4* 10.2* 9.8* 10.2*  HCT 36.4   < > 28.5* 29.7* 31.8* 30.5* 29.8*  MCV 102.8*   < > 106.7* 110.4* 109.7* 109.7* 104.9*  PLT 142*   < > 101* 78* 106* 105* 127*   < > = values in this interval not displayed.    Basic Metabolic Panel: Recent Labs  Lab 02/28/23 0305 03/01/23 0309 03/01/23 1259 03/01/23 1705 03/02/23 0438 03/02/23 1501 03/02/23 1709 03/03/23 0139  NA 148* 145  --   --  143 141  --  138  K 4.4 3.7  --   --  3.7 3.9  --  3.7  CL 120* 119*  --   --  115* 115*  --  112*  CO2 23 19*  --   --  21* 22  --  21*  GLUCOSE 114* 99  --   --  89 91  --  93  BUN 15 10  --   --  13 16  --  14  CREATININE  0.72 0.63  --   --  0.69 0.78  --  0.72  CALCIUM 9.6 9.3  --   --  9.5 9.1  --  9.1  MG  --   --  1.9 1.7 1.9 1.8 1.9 2.4  PHOS  --   --  2.5 2.7 2.8  --  3.1 2.9   GFR: Estimated Creatinine Clearance: 55.7 mL/min (by C-G formula based on SCr of 0.72 mg/dL). Recent Labs  Lab 02/26/23 0948 02/27/23 0254 02/28/23 0305 03/01/23 0309 03/02/23 0438 03/03/23 0139  WBC  --    < > 2.3* 6.4 4.4 3.3*  LATICACIDVEN 1.3  --   --   --   --   --    < > =  values in this interval not displayed.    Liver Function Tests: Recent Labs  Lab 02/27/23 0254 02/28/23 0305 03/01/23 0309 03/02/23 0438 03/03/23 0139  AST 53* 41 40 39 40  ALT 36 28 28 26 26   ALKPHOS 184* 144* 157* 170* 191*  BILITOT 1.4* 2.2* 2.9* 2.6* 2.8*  PROT 6.1* 6.9 6.5 6.3* 6.3*  ALBUMIN 3.3* 4.5 4.0 3.6 3.3*   No results for input(s): "LIPASE", "AMYLASE" in the last 168 hours. Recent Labs  Lab 02/27/23 0254 02/28/23 0305 03/01/23 0309 03/02/23 0438 03/03/23 0139  AMMONIA 54* 66* 50* 45* 76*    ABG    Component Value Date/Time   PHART 7.48 (H) 04/30/2022 1600   PCO2ART 41 04/30/2022 1600   PO2ART 105 04/30/2022 1600   HCO3 30.5 (H) 04/30/2022 1600   TCO2 17.6 12/19/2010 2150   ACIDBASEDEF 2.0 12/19/2010 2150   O2SAT 98.9 04/30/2022 1600     Coagulation Profile: Recent Labs  Lab 02/27/23 1143 02/28/23 0305 03/01/23 0309 03/02/23 0438 03/03/23 0139  INR 1.9* 2.1* 2.1* 2.1* 1.8*    Cardiac Enzymes: No results for input(s): "CKTOTAL", "CKMB", "CKMBINDEX", "TROPONINI" in the last 168 hours.  HbA1C: Hgb A1c MFr Bld  Date/Time Value Ref Range Status  08/14/2020 07:45 AM 5.1 4.8 - 5.6 % Final    Comment:    (NOTE) Pre diabetes:          5.7%-6.4%  Diabetes:              >6.4%  Glycemic control for   <7.0% adults with diabetes     CBG: Recent Labs  Lab 03/02/23 1130 03/02/23 1515 03/02/23 2006 03/03/23 0101 03/03/23 0552  GLUCAP 76 72 90 92 88     Critical care time: n/a      Posey BoyerBrooke Loel Betancur, MSN, AGACNP-BC [facility] 03/03/2023, 10:34 AM  See Amion for pager If no response to pager, please call PCCM consult pager After 7:00 pm call Elink

## 2023-03-03 NOTE — Progress Notes (Signed)
Cortrak Tube Team Note:  Request received to place a bridle on existing small-bore feeding tube.  RD placed bridle without incident. Small-bore feeding tube was secured with a bridle at 65 cm in the left nare.   Mertie Clause, MS, RD, LDN Inpatient Clinical Dietitian Please see AMiON for contact information.

## 2023-03-03 NOTE — Progress Notes (Signed)
  Echocardiogram 2D Echocardiogram has been performed.  [name] 03/03/2023, 3:17 PM

## 2023-03-03 NOTE — Progress Notes (Signed)
Inpatient Rehab Coordinator Note:  I met with patient and her spouse, [name], at bedside to discuss CIR recommendations and goals/expectations of CIR stay.  We reviewed 3 hrs/day of therapy, physician follow up, and average length of stay 2 weeks (dependent upon progress) with goals of supervision to mod I.  We discussed timing of potential transition to rehab, when medically cleared by attending, as well as briefly discussing [facility] Medicare benefits.  I let them know I would get specific numbers on benefits and will follow for progression throughout hospital stay.  Estill Dooms, PT, DPT Admissions Coordinator [phone] 03/03/23  12:39 PM

## 2023-03-03 NOTE — Progress Notes (Signed)
OT Cancellation Note  Patient Details Name: [name] MRN: [id number] DOB: 07/01/1951   Cancelled Treatment:    Reason Eval/Treat Not Completed: Patient at procedure or test/ unavailable (echo at bedside)  Mateo Flow 03/03/2023, 3:57 PM

## 2023-03-03 NOTE — Consult Note (Signed)
Physical Medicine and Rehabilitation Consult Reason for Consult: Hepatic encephalopathy Referring Physician: Cheri Fowler, MD   HPI: [name] is a 72 y.o. female with colon cancer s/p chemotherapy, complicated by liver cirrhosis, requiring TIPS in 2012, further complicated by hepatic encephalopathy in the setting of opioid medication. She was admitted to the hospitalist service with acute hepatic encephalopathy with ammonia elevated to 163. Lactulose and Rifaximin helped to improve her mental status and ammonia decreased to 45. Patient went into SVT with HR 130s to 140s and then became bradycardic to 30s requiring atropine. Cardiology recommended transcutaneous pacing. Physical Medicine & Rehabilitation was consulted to assess candidacy for CIR.     Review of Systems  Constitutional: Negative.   HENT: Negative.         +NGT  Eyes: Negative.   Respiratory: Negative.    Cardiovascular: Negative.   Skin: Negative.   Neurological:  Negative for speech change.       +intermittent encephalopathy +Right wrist fracture  Psychiatric/Behavioral: Negative.     Past Medical History:  Diagnosis Date   Acute urinary retention 05/04/2022   Allergy 2006 ?   Contrast dye   Arthritis 2016 ??   Knees and thumb   Cancer    cecum   Cataract 2021   Surgery scheduled July 2023   Colon cancer 2003   Elevated liver function tests    Esophageal varices    Heart murmur On file   Hemorrhage of gastrointestinal tract 05/04/2011   Hypertension 2021   Hypothyroidism    Iron deficiency anemia    Liver disease    chemotherapy complication, per pt, shunts placed to bypass liver   Malignant neoplasm of cecum    Portal hypertension    Skin cancer 2019   Splenomegaly    Past Surgical History:  Procedure Laterality Date   COLON SURGERY  2004   Cancer   COSMETIC SURGERY  2021   Skin cancer   ESOPHAGEAL VARICE LIGATION     EYE SURGERY     HEMICOLECTOMY  01/08/2003   IR RADIOLOGIST  EVAL & MGMT  12/20/2020   IR RADIOLOGIST EVAL & MGMT  05/29/2021   LIVER SURGERY     shunts placed after chemo complication   SKIN FULL THICKNESS GRAFT N/A 09/12/2019   Procedure: debridement and FTSG to the nose from left upper arm;  Surgeon: Allena Napoleon, MD;  Location: [facility];  Service: Plastics;  Laterality: N/A;  2 hours, please   TIPS PROCEDURE     Family History  Problem Relation Age of Onset   Arthritis Mother    Hearing loss Mother    Heart disease Mother    Hypertension Mother    Miscarriages / [place] Mother    Arthritis Father    Diabetes Father    Heart disease Father    Cancer Maternal Aunt    Breast cancer Neg Hx    Colon cancer Neg Hx    Esophageal cancer Neg Hx    Pancreatic cancer Neg Hx    Stomach cancer Neg Hx    Social History:  reports that she has never smoked. She has never used smokeless tobacco. She reports that she does not drink alcohol and does not use drugs. Allergies:  Allergies  Allergen Reactions   Contrast Media [Iodinated Contrast Media] Hives   Medications Prior to Admission  Medication Sig Dispense Refill   acidophilus (RISAQUAD) CAPS capsule Take 1 capsule by mouth daily.  Cranberry 50 MG CHEW Chew 50 mg by mouth 2 (two) times daily.     doxycycline (VIBRAMYCIN) 50 MG capsule Take 50 mg by mouth daily.     estradiol (ESTRACE) 0.1 MG/GM vaginal cream Place 1 Applicatorful vaginally every other day.     furosemide (LASIX) 20 MG tablet Take 2 tablets (40 mg total) by mouth daily. Hold until you see your liver doctor (Patient taking differently: Take 40 mg by mouth daily.) 30 tablet    lactulose, encephalopathy, (CHRONULAC) 10 GM/15ML SOLN Take 60 mLs by mouth 3 (three) times daily.     levOCARNitine (CARNITOR) 330 MG tablet Take 330 mg by mouth 3 (three) times daily.     Multiple Vitamin (MULTI-VITAMIN) tablet Take 1 tablet by mouth daily.     spironolactone (ALDACTONE) 50 MG tablet Take 0.5 tablets (25 mg total)  by mouth daily.     SYNTHROID 75 MCG tablet Take 1 tablet (75 mcg total) by mouth daily before breakfast. Must be seen in office for more refills. (Patient taking differently: Take 75 mcg by mouth daily before breakfast.) 90 tablet 0   zinc gluconate 50 MG tablet Take 50 mg by mouth 3 (three) times daily.     albuterol (VENTOLIN HFA) 108 (90 Base) MCG/ACT inhaler Inhale 1-2 puffs into the lungs every 6 (six) hours as needed for wheezing or shortness of breath. (Patient not taking: Reported on 02/26/2023) 8 g 0   nitrofurantoin, macrocrystal-monohydrate, (MACROBID) 100 MG capsule Take 1 capsule (100 mg total) by mouth 2 (two) times daily. (Patient not taking: Reported on 02/26/2023) 10 capsule 0    Home: Home Living Family/patient expects to be discharged to:: Private residence Living Arrangements: Spouse/significant other Available Help at Discharge: Family, Available PRN/intermittently Type of Home: House Home Access: Ramped entrance Home Layout: One level Bathroom Shower/Tub: Health visitor: Standard Bathroom Accessibility: Yes Home Equipment: Agricultural consultant (2 wheels), BSC/3in1, Medical laboratory scientific officer - single point  Functional History: Prior Function Prior Level of Function : Independent/Modified Independent, Working/employed Mobility Comments: Librarian at a school, 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 ADLs Comments: independent Functional Status:  Mobility: Bed Mobility Overal bed mobility: Needs Assistance Bed Mobility: Supine to Sit Supine to sit: Mod assist, +2 for physical assistance Sit to supine: Mod assist, +2 for physical assistance General bed mobility comments: increased time and multimodal cues with assist for legs and trunk, max assist and bed pad to scoot to bed edge Transfers Overall transfer level: Needs assistance Equipment used: 2 person hand held assist Transfers: Bed to chair/wheelchair/BSC, Sit to/from Stand Sit to Stand: +2 safety/equipment, Mod assist, From  elevated surface Bed to/from chair/wheelchair/BSC transfer type:: Via Lift equipment Transfer via Lift Equipment: Stedy General transfer comment: patient able  to reach for STEDY horizontal bar with RUE and pull to stnad. patient remained in standing during transfer to recliner. Ambulation/Gait General Gait Details: defered    ADL: ADL Overall ADL's : Needs assistance/impaired Eating/Feeding: NPO Grooming: Minimal assistance, Sitting Upper Body Bathing: Minimal assistance, Sitting Upper Body Bathing Details (indicate cue type and reason): assumed unable to test with patient attempting to pull NG tube out of nose. mit on R hand in place and cast on L side Lower Body Bathing: Maximal assistance, Sit to/from stand Upper Body Dressing : Minimal assistance, Sitting Lower Body Dressing: Maximal assistance, Sitting/lateral leans Toilet Transfer: Moderate assistance, +2 for safety/equipment, +2 for physical assistance Toileting- Clothing Manipulation and Hygiene: Total assistance, Sit to/from stand  Cognition: Cognition Overall Cognitive  Status: Impaired/Different from baseline Orientation Level: Oriented X4 Cognition Arousal/Alertness: Awake/alert Behavior During Therapy: Flat affect Overall Cognitive Status: Impaired/Different from baseline Area of Impairment: Orientation, Attention, Following commands, Awareness Orientation Level: Situation, Time Current Attention Level: Sustained Following Commands: Follows one step commands with increased time Safety/Judgement: Decreased awareness of safety, Decreased awareness of deficits Awareness: Emergent Problem Solving: Slow processing, Decreased initiation, Difficulty sequencing, Requires verbal cues, Requires tactile cues General Comments: per spouse, patient is more alert and aware, oriented to "[place] cone".  Blood pressure (!) 105/53, pulse 60, temperature 98.4 F (36.9 C), temperature source Oral, resp. rate 18, height  (1.626 m),  weight 62.6 kg, SpO2 96 %. Physical Exam Gen: no distress, normal appearing HEENT: oral mucosa pink and moist, NCAT, NGT in place Cardio: Reg rate Chest: normal effort, normal rate of breathing Abd: soft, non-distended Ext: no edema Psych: pleasant, normal affect Skin: intact Neuro: Alert and  MSK: left forearm in cast  Results for orders placed or performed during the hospital encounter of 02/25/23 (from the past 24 hour(s))  Glucose, capillary     Status: None   Collection Time: 03/02/23 11:30 AM  Result Value Ref Range   Glucose-Capillary 76 70 - 99 mg/dL   Comment 1 Notify RN    Comment 2 Document in Chart   Basic metabolic panel     Status: Abnormal   Collection Time: 03/02/23  3:01 PM  Result Value Ref Range   Sodium 141 135 - 145 mmol/L   Potassium 3.9 3.5 - 5.1 mmol/L   Chloride 115 (H) 98 - 111 mmol/L   CO2 22 22 - 32 mmol/L   Glucose, Bld 91 70 - 99 mg/dL   BUN 16 8 - 23 mg/dL   Creatinine, Ser 1.61 0.44 - 1.00 mg/dL   Calcium 9.1 8.9 - 09.6 mg/dL   GFR, Estimated >04 >54 mL/min   Anion gap 4 (L) 5 - 15  Magnesium     Status: None   Collection Time: 03/02/23  3:01 PM  Result Value Ref Range   Magnesium 1.8 1.7 - 2.4 mg/dL  Glucose, capillary     Status: None   Collection Time: 03/02/23  3:15 PM  Result Value Ref Range   Glucose-Capillary 72 70 - 99 mg/dL   Comment 1 Notify RN    Comment 2 Document in Chart   Magnesium     Status: None   Collection Time: 03/02/23  5:09 PM  Result Value Ref Range   Magnesium 1.9 1.7 - 2.4 mg/dL  Phosphorus     Status: None   Collection Time: 03/02/23  5:09 PM  Result Value Ref Range   Phosphorus 3.1 2.5 - 4.6 mg/dL  MRSA Next Gen by PCR, Nasal     Status: None   Collection Time: 03/02/23  7:40 PM   Specimen: Nasal Mucosa; Nasal Swab  Result Value Ref Range   MRSA by PCR Next Gen NOT DETECTED NOT DETECTED  Glucose, capillary     Status: None   Collection Time: 03/02/23  8:06 PM  Result Value Ref Range    Glucose-Capillary 90 70 - 99 mg/dL  Glucose, capillary     Status: None   Collection Time: 03/03/23  1:01 AM  Result Value Ref Range   Glucose-Capillary 92 70 - 99 mg/dL  Protime-INR     Status: Abnormal   Collection Time: 03/03/23  1:39 AM  Result Value Ref Range   Prothrombin Time 20.9 (H) 11.4 - 15.2 seconds  INR 1.8 (H) 0.8 - 1.2  CBC     Status: Abnormal   Collection Time: 03/03/23  1:39 AM  Result Value Ref Range   WBC 3.3 (L) 4.0 - 10.5 K/uL   RBC 2.84 (L) 3.87 - 5.11 MIL/uL   Hemoglobin 10.2 (L) 12.0 - 15.0 g/dL   HCT 40.929.8 (L) 81.136.0 - 91.446.0 %   MCV 104.9 (H) 80.0 - 100.0 fL   MCH 35.9 (H) 26.0 - 34.0 pg   MCHC 34.2 30.0 - 36.0 g/dL   RDW 78.218.5 (H) 95.611.5 - 21.315.5 %   Platelets 127 (L) 150 - 400 K/uL   nRBC 0.0 0.0 - 0.2 %  Ammonia     Status: Abnormal   Collection Time: 03/03/23  1:39 AM  Result Value Ref Range   Ammonia 76 (H) 9 - 35 umol/L  Comprehensive metabolic panel     Status: Abnormal   Collection Time: 03/03/23  1:39 AM  Result Value Ref Range   Sodium 138 135 - 145 mmol/L   Potassium 3.7 3.5 - 5.1 mmol/L   Chloride 112 (H) 98 - 111 mmol/L   CO2 21 (L) 22 - 32 mmol/L   Glucose, Bld 93 70 - 99 mg/dL   BUN 14 8 - 23 mg/dL   Creatinine, Ser 0.860.72 0.44 - 1.00 mg/dL   Calcium 9.1 8.9 - 57.810.3 mg/dL   Total Protein 6.3 (L) 6.5 - 8.1 g/dL   Albumin 3.3 (L) 3.5 - 5.0 g/dL   AST 40 15 - 41 U/L   ALT 26 0 - 44 U/L   Alkaline Phosphatase 191 (H) 38 - 126 U/L   Total Bilirubin 2.8 (H) 0.3 - 1.2 mg/dL   GFR, Estimated >46>60 >96>60 mL/min   Anion gap 5 5 - 15  Magnesium     Status: None   Collection Time: 03/03/23  1:39 AM  Result Value Ref Range   Magnesium 2.4 1.7 - 2.4 mg/dL  Phosphorus     Status: None   Collection Time: 03/03/23  1:39 AM  Result Value Ref Range   Phosphorus 2.9 2.5 - 4.6 mg/dL  Glucose, capillary     Status: None   Collection Time: 03/03/23  5:52 AM  Result Value Ref Range   Glucose-Capillary 88 70 - 99 mg/dL   No results  found.  Assessment/Plan: Diagnosis: Encephalopathy Does the need for close, 24 hr/day medical supervision in concert with the patient's rehab needs make it unreasonable for this patient to be served in a less intensive setting? Yes Co-Morbidities requiring supervision/potential complications:  1) SVT: will benefit from TID monitoring of HR once discharged from the ICU 2) Decompensated liver cirrhosis: continue lactulose  3) Hypotension: continue midodrine 4) Anemia 5) Severe protein calorie malnutrition Due to bladder management, bowel management, safety, skin/wound care, disease management, medication administration, pain management, and patient education, does the patient require 24 hr/day rehab nursing? Yes Does the patient require coordinated care of a physician, rehab nurse, therapy disciplines of PT, OT, SLP to address physical and functional deficits in the context of the above medical diagnosis(es)? Yes Addressing deficits in the following areas: balance, endurance, locomotion, strength, transferring, bowel/bladder control, bathing, dressing, feeding, grooming, toileting, swallowing, and psychosocial support Can the patient actively participate in an intensive therapy program of at least 3 hrs of therapy per day at least 5 days per week? Yes The potential for patient to make measurable gains while on inpatient rehab is excellent Anticipated functional outcomes upon discharge from inpatient  rehab are supervision  with PT, supervision with OT, supervision with SLP. Estimated rehab length of stay to reach the above functional goals is: 10-14 days Anticipated discharge destination: Home Overall Rehab/Functional Prognosis: excellent  POST ACUTE RECOMMENDATIONS: This patient's condition is appropriate for continued rehabilitative care in the following setting: CIR Patient has agreed to participate in recommended program. Yes Note that insurance prior authorization may be required for  reimbursement for recommended care.   I have personally performed a face to face diagnostic evaluation of this patient. Additionally, I have examined the patient's medical record including any pertinent labs and radiographic images. If the physician assistant has documented in this note, I have reviewed and edited or otherwise concur with the physician assistant's documentation.  Thanks,  Horton Chin, MD 03/03/2023

## 2023-03-03 NOTE — Progress Notes (Signed)
Chart reviewed. Telemetry unremarkable since administration of atropine.  D/w PCCM Dr. Merrily Pew. Conservative management of tachycardia and bradycardia recommended.  Cardiology will follow peripherally.  Parke Poisson, MD 11:59 AM 03/03/23

## 2023-03-04 DIAGNOSIS — K7682 Hepatic encephalopathy: Secondary | ICD-10-CM | POA: Diagnosis not present

## 2023-03-04 LAB — COMPREHENSIVE METABOLIC PANEL
ALT: 26 U/L (ref 0–44)
AST: 44 U/L — ABNORMAL HIGH (ref 15–41)
Albumin: 3.2 g/dL — ABNORMAL LOW (ref 3.5–5.0)
Alkaline Phosphatase: 201 U/L — ABNORMAL HIGH (ref 38–126)
Anion gap: 9 (ref 5–15)
BUN: 20 mg/dL (ref 8–23)
CO2: 20 mmol/L — ABNORMAL LOW (ref 22–32)
Calcium: 9.1 mg/dL (ref 8.9–10.3)
Chloride: 109 mmol/L (ref 98–111)
Creatinine, Ser: 0.72 mg/dL (ref 0.44–1.00)
GFR, Estimated: >60 mL/min (ref >60)
Glucose, Bld: 98 mg/dL (ref 70–99)
Potassium: 4 mmol/L (ref 3.5–5.1)
Sodium: 138 mmol/L (ref 135–145)
Total Bilirubin: 2.5 mg/dL — ABNORMAL HIGH (ref 0.3–1.2)
Total Protein: 6.2 g/dL — ABNORMAL LOW (ref 6.5–8.1)

## 2023-03-04 LAB — AFP TUMOR MARKER: AFP, Serum, Tumor Marker: <1.8 ng/mL (ref 0.0–9.2)

## 2023-03-04 LAB — AMMONIA: Ammonia: 44 umol/L — ABNORMAL HIGH (ref 9–35)

## 2023-03-04 LAB — GLUCOSE, CAPILLARY
Glucose-Capillary: 100 mg/dL — ABNORMAL HIGH (ref 70–99)
Glucose-Capillary: 103 mg/dL — ABNORMAL HIGH (ref 70–99)
Glucose-Capillary: 44 mg/dL — CL (ref 70–99)
Glucose-Capillary: 47 mg/dL — ABNORMAL LOW (ref 70–99)
Glucose-Capillary: 64 mg/dL — ABNORMAL LOW (ref 70–99)
Glucose-Capillary: 77 mg/dL (ref 70–99)
Glucose-Capillary: 88 mg/dL (ref 70–99)

## 2023-03-04 LAB — MAGNESIUM
Magnesium: 1.8 mg/dL (ref 1.7–2.4)
Magnesium: 1.9 mg/dL (ref 1.7–2.4)

## 2023-03-04 LAB — PHOSPHORUS
Phosphorus: 3 mg/dL (ref 2.5–4.6)
Phosphorus: 3.3 mg/dL (ref 2.5–4.6)

## 2023-03-04 LAB — CEA: CEA: 10.9 ng/mL — ABNORMAL HIGH (ref 0.0–4.7)

## 2023-03-04 MED ORDER — LACTULOSE 10 GM/15ML PO SOLN
30.0000 g | Freq: Four times a day (QID) | ORAL | Status: DC
  Administered 2023-03-04 – 2023-03-06 (×10): 30 g via ORAL
  Filled 2023-03-04 (×10): qty 45

## 2023-03-04 MED ORDER — LEVOTHYROXINE SODIUM 75 MCG PO TABS
75.0000 ug | ORAL_TABLET | Freq: Every day | ORAL | Status: DC
  Administered 2023-03-05 – 2023-03-13 (×9): 75 ug via ORAL
  Filled 2023-03-04 (×9): qty 1

## 2023-03-04 MED ORDER — THIAMINE MONONITRATE 100 MG PO TABS
100.0000 mg | ORAL_TABLET | Freq: Every day | ORAL | Status: AC
  Administered 2023-03-04 – 2023-03-05 (×2): 100 mg via ORAL
  Filled 2023-03-04 (×2): qty 1

## 2023-03-04 MED ORDER — GLUCOSE 40 % PO GEL
ORAL | Status: AC
  Administered 2023-03-04: 37.5 g
  Filled 2023-03-04: qty 1.21

## 2023-03-04 MED ORDER — SODIUM BICARBONATE 650 MG PO TABS
650.0000 mg | ORAL_TABLET | Freq: Once | ORAL | Status: AC
  Administered 2023-03-04: 650 mg
  Filled 2023-03-04: qty 1

## 2023-03-04 MED ORDER — PANCRELIPASE (LIP-PROT-AMYL) 10440-39150 UNITS PO TABS
20880.0000 [IU] | ORAL_TABLET | Freq: Once | ORAL | Status: AC
  Administered 2023-03-04: 20880 [IU]
  Filled 2023-03-04: qty 2

## 2023-03-04 MED ORDER — MIDODRINE HCL 5 MG PO TABS: 10.0000 mg | ORAL_TABLET | Freq: Two times a day (BID) | ORAL | Status: DC

## 2023-03-04 MED ORDER — MIDODRINE HCL 5 MG PO TABS
10.0000 mg | ORAL_TABLET | Freq: Two times a day (BID) | ORAL | Status: DC
  Administered 2023-03-04 – 2023-03-06 (×6): 10 mg via ORAL
  Filled 2023-03-04 (×6): qty 2

## 2023-03-04 MED ORDER — POLYETHYLENE GLYCOL 3350 17 G PO PACK: 17.0000 g | PACK | Freq: Every day | ORAL | Status: DC | PRN

## 2023-03-04 NOTE — Care Management Important Message (Signed)
Important Message  Patient Details  Name: [name] MRN: [id number] Date of Birth: 1951-04-14   Medicare Important Message Given:  Yes     Renie Ora 03/04/2023, 2:13 PM

## 2023-03-04 NOTE — Consult Note (Addendum)
   [facility] CM Inpatient Consult   03/04/2023  [name] 09-08-1951 [id number]  [facility] [[facility]]  Accountable Care Organization [ACO] Patient: Advertising copywriter Medicare  Primary Care Provider:  Eden Emms, NP with Corinda Gubler at [facility] is listed to provide the transition of care follow up   Patient screened for hospitalization with noted extreme high risk score for unplanned readmission risk 6 day length of stay and  to assess for potential [facility]  [[facility] Care Management service needs for post hospital transition for care coordination.  Review of patient's electronic medical record reveals patient is being considered for rehabilitation options for transition of care needs.  03/05/23  1045 am patient up in recliner, husband at bedside currently speaking with nursing students on round, patient discussed in progression meeting for ongoing rehab needs.  Plan:  Continue to follow progress and disposition to assess for post hospital community care coordination/management needs.  Referral request for community care coordination:  pending disposition needs.  Of note, [facility] Care Management/Population Health does not replace or interfere with any arrangements made by the Inpatient Transition of Care team.  For questions contact:   Charlesetta Shanks, RN BSN CCM [facility]  [phone] business mobile phone Toll free office [phone]  *Concierge Line  [phone] Fax number: [phone] [email or web address]

## 2023-03-04 NOTE — Progress Notes (Signed)
Physical Therapy Treatment Patient Details Name: [name] MRN: [id number] DOB: 16-Nov-1951 Today's Date: 03/04/2023   History of Present Illness Patient is 72 year old female admitted on 02/25/23 with acute hepatic encephalopathy. Pt with fall in March 2024 that resulted in L wrist fx.  She was on opioids which caused constipation and even with increased lactulose she was not having bowel movements and developed encephalopathy and hypothermia.  Pt with hx including but not limited to colon CA, liver cirrhosis, TIPS, hepatic encephalopathy.    PT Comments    Pt progressing well towards her physical therapy goals, exhibiting improved cognition and activity tolerance. Utilized Stedy to transfer from bed to chair, however, then progressed to standing and limited ambulation distance trials with handheld assist. Demonstrates impaired standing balance, decreased endurance, weakness, cognitive deficits. Would benefit from intensive rehabilitation to address deficits, maximize functional mobility and decrease caregiver burden.    Recommendations for follow up therapy are one component of a multi-disciplinary discharge planning process, led by the attending physician.  Recommendations may be updated based on patient status, additional functional criteria and insurance authorization.  Follow Up Recommendations  Can patient physically be transported by private vehicle: No    Assistance Recommended at Discharge Frequent or constant Supervision/Assistance  Patient can return home with the following A little help with walking and/or transfers;A little help with bathing/dressing/bathroom;Assistance with cooking/housework;Assist for transportation;Help with stairs or ramp for entrance;Direct supervision/assist for medications management;Direct supervision/assist for financial management   Equipment Recommendations  Other (comment) (TBA)    Recommendations for Other Services       Precautions /  Restrictions Precautions Precautions: Fall;Other (comment) Precaution Comments: NGT Restrictions Weight Bearing Restrictions: Yes LUE Weight Bearing: Non weight bearing     Mobility  Bed Mobility Overal bed mobility: Needs Assistance Bed Mobility: Supine to Sit     Supine to sit: Min assist     General bed mobility comments: Cues for initiation, use of bed rail with RUE, minA to scoot hips out to edge of bed with use of bed pad    Transfers Overall transfer level: Needs assistance Equipment used: Ambulation equipment used, 2 person hand held assist Transfers: Sit to/from Stand, Bed to chair/wheelchair/BSC Sit to Stand: Min assist           General transfer comment: Stedy for transfer from bed to chair. Additionally 2 stands from chair with minA to steady and HHA for external support    Ambulation/Gait Ambulation/Gait assistance: Min assist, +2 physical assistance Gait Distance (Feet): 8 Feet (8", 8") Assistive device: 2 person hand held assist Gait Pattern/deviations: Step-through pattern, Decreased stride length, Narrow base of support Gait velocity: decreased Gait velocity interpretation: <1.31 ft/sec, indicative of household ambulator   General Gait Details: MinA + 2 for balance/safety, increased lateral sway, dynamic instability   Stairs             Wheelchair Mobility    Modified Rankin (Stroke Patients Only)       Balance Overall balance assessment: Needs assistance Sitting-balance support: Feet supported Sitting balance-Leahy Scale: Fair     Standing balance support: Single extremity supported, During functional activity Standing balance-Leahy Scale: Fair                              Cognition Arousal/Alertness: Awake/alert Behavior During Therapy: Flat affect Overall Cognitive Status: Impaired/Different from baseline Area of Impairment: Attention, Following commands, Awareness  Current Attention  Level: Selective   Following Commands: Follows one step commands with increased time Safety/Judgement: Decreased awareness of safety, Decreased awareness of deficits Awareness: Emergent Problem Solving: Slow processing, Decreased initiation, Difficulty sequencing, Requires verbal cues, Requires tactile cues General Comments: Improved cognition, now oriented to date and place.        Exercises General Exercises - Lower Extremity Long Arc Quad: Both, 10 reps, Seated Hip Flexion/Marching: Both, 10 reps, Seated    General Comments        Pertinent Vitals/Pain Pain Assessment Pain Assessment: No/denies pain    Home Living                          Prior Function            PT Goals (current goals can now be found in the care plan section) Acute Rehab PT Goals Patient Stated Goal: return home Potential to Achieve Goals: Good Progress towards PT goals: Progressing toward goals    Frequency    Min 3X/week      PT Plan Current plan remains appropriate    Co-evaluation              AM-PAC PT "6 Clicks" Mobility   Outcome Measure  Help needed turning from your back to your side while in a flat bed without using bedrails?: A Little Help needed moving from lying on your back to sitting on the side of a flat bed without using bedrails?: A Little Help needed moving to and from a bed to a chair (including a wheelchair)?: A Little Help needed standing up from a chair using your arms (e.g., wheelchair or bedside chair)?: A Little Help needed to walk in hospital room?: A Lot Help needed climbing 3-5 steps with a railing? : Total 6 Click Score: 15    End of Session Equipment Utilized During Treatment: Gait belt Activity Tolerance: Patient tolerated treatment well Patient left: in chair;with call bell/phone within reach;with chair alarm set;with family/visitor present Nurse Communication: Mobility status PT Visit Diagnosis: Other abnormalities of gait and  mobility (R26.89);Muscle weakness (generalized) (M62.81)     Time: 4098-1191 PT Time Calculation (min) (ACUTE ONLY): 21 min  Charges:  $Therapeutic Activity: 8-22 mins                     Lillia Pauls, PT, DPT Acute Rehabilitation Services Office [phone]    Norval Morton 03/04/2023, 11:41 AM

## 2023-03-04 NOTE — Progress Notes (Signed)
Speech Language Pathology Treatment: Dysphagia  Patient Details Name: [name] MRN: [id number] DOB: May 28, 1951 Today's Date: 03/04/2023 Time: 7342-8768 SLP Time Calculation (min) (ACUTE ONLY): 23 min  Assessment / Plan / Recommendation Clinical Impression  Pt was seen upright in her chair during lunch meal. She was alert and engaged during PO intake, although needing assistance with feeding. She showed no overt signs of dysphagia or aspiration. Pt and husband say that she has been swallowing well and would prefer to not have her food cut up already. Note that there is also concern for pt's intake and several of the foods that they say she eats at home would not be available on a Dys 3 diet. SLP to advance to regular solids and thin liquids. No further acute needs at this time so will s/o  - pt and husband in agreement.    HPI HPI: Patient is a 72 y.o. female with PMH: colon cancer s/p partial colectomy and chemotherapy, liver cirrhosis, hepatic encephalopathy. She recently broke her wrist and was given narcotic and became constipated. She presented to the hospital on 02/25/23 with confusion, nausea and vomiting for two days prior. In ED she was lethargic and hypothermic, UA positive for pyuria, ammonia was elevated. She has been NPO due to being too lethargic for oral intake.      SLP Plan  All goals met      Recommendations for follow up therapy are one component of a multi-disciplinary discharge planning process, led by the attending physician.  Recommendations may be updated based on patient status, additional functional criteria and insurance authorization.    Recommendations  Diet recommendations: Regular;Thin liquid Liquids provided via: Straw;Cup Medication Administration: Whole meds with liquid Supervision: Staff to assist with self feeding;Trained caregiver to feed patient Compensations: Slow rate;Small sips/bites Postural Changes and/or Swallow Maneuvers: Seated upright 90  degrees;Upright 30-60 min after meal                  Oral care BID;Staff/trained caregiver to provide oral care;Oral care prior to ice chip/H20   PRN Dysphagia, unspecified (R13.10)     All goals met     Mahala Menghini., M.A. CCC-SLP Acute Rehabilitation Services Office [phone]  Secure chat preferred   03/04/2023, 12:35 PM

## 2023-03-04 NOTE — Progress Notes (Signed)
Nutrition Brief Note  RD made aware Dobhoff tube is clogged with unsuccessful attempt to unclog. Noted plans for 1 more unclog attempt and if unable to do so will remove tube. 24 hour calorie count ordered to monitor pt's PO intake for consideration of Cortrak tube placement.   Spoke with pt's husband at bedside. He states that she was sent pork sausage this morning but d/t her liver, they avoid foods like pork and beef so she did not eat this. For breakfast today she had 100% apple sauce, 6 bites of a pancake, 100% milk, 100% orange juice and 50% of a banana (255 kcal and 10g protein). She had not had an Ensure yet, so provided 1 for her to sip on prior to lunch.    Her husband states that pot roast had been ordered for her for lunch but given that she does not typically eat this, they cancelled and reordered something else.   Given pt is severely malnourished, has been with limited PO intake and d/t increased nutrition needs, encouraged a non-restrictive diet approach until PO intake improves and is adequately meeting her nutrition needs.   Drusilla Kanner, RDN, LDN Clinical Nutrition

## 2023-03-04 NOTE — Progress Notes (Signed)
Occupational Therapy Treatment Patient Details Name: [name] MRN: [id number] DOB: 11/27/1950 Today's Date: 03/04/2023   History of present illness Patient is 71 year old female admitted on 02/25/23 with acute hepatic encephalopathy. Pt with fall in March 2024 that resulted in L wrist fx.  She was on opioids which caused constipation and even with increased lactulose she was not having bowel movements and developed encephalopathy and hypothermia.  Pt with hx including but not limited to colon CA, liver cirrhosis, TIPS, hepatic encephalopathy.   OT comments  Pt continuing to progress towards patient focused goals. Patient currently displaying impaired balance without AD and has a slow gait pattern, ambulating with Min A 2+ HHA for Pt confidence and safety. Pt displayed good standing balance with one UE supported at sink for hygiene routine. Pt completing dressing at bedside with min guard assist, balance seems to improve the more patient acclimates to OOB/EOB mobility, Patient has the potential to reach Mod I and demos the ability to tolerate 3 hours of therapy. Pt would benefit from an intensive rehab program >3hrs of therapy to help maximize functional independence.    Recommendations for follow up therapy are one component of a multi-disciplinary discharge planning process, led by the attending physician.  Recommendations may be updated based on patient status, additional functional criteria and insurance authorization.    Assistance Recommended at Discharge Frequent or constant Supervision/Assistance  Patient can return home with the following  Two people to help with walking and/or transfers;Direct supervision/assist for medications management;Assistance with cooking/housework;Assist for transportation;Help with stairs or ramp for entrance;Direct supervision/assist for financial management;A little help with bathing/dressing/bathroom   Equipment Recommendations  None recommended by OT     Recommendations for Other Services      Precautions / Restrictions Precautions Precautions: Fall Restrictions Weight Bearing Restrictions: Yes LUE Weight Bearing: Non weight bearing       Mobility Bed Mobility Overal bed mobility: Needs Assistance Bed Mobility: Supine to Sit, Sit to Supine     Supine to sit: Supervision Sit to supine: Supervision   General bed mobility comments: Pt takes increased time but with use of bedrails RUE she is able to sit EOB and scoot hips forward    Transfers Overall transfer level: Needs assistance Equipment used: 2 person hand held assist Transfers: Sit to/from Stand Sit to Stand: Min assist                 Balance Overall balance assessment: Needs assistance Sitting-balance support: Feet supported Sitting balance-Leahy Scale: Fair     Standing balance support: Single extremity supported, During functional activity Standing balance-Leahy Scale: Fair Standing balance comment: Completed first STS without support, Min A to Min guard balance                           ADL either performed or assessed with clinical judgement   ADL Overall ADL's : Needs assistance/impaired     Grooming: Standing;Min guard Grooming Details (indicate cue type and reason): standing at sink with one UE supported             Lower Body Dressing: Min guard;Sitting/lateral leans Lower Body Dressing Details (indicate cue type and reason): Pt doffed and donned socks BLE             Functional mobility during ADLs: Minimal assistance;+2 for physical assistance      Extremity/Trunk Assessment  Vision       Perception     Praxis      Cognition Arousal/Alertness: Awake/alert Behavior During Therapy: WFL for tasks assessed/performed Overall Cognitive Status: Within Functional Limits for tasks assessed                                 General Comments: Pt displaying better cognition and command  following in today's session        Exercises      Shoulder Instructions       General Comments VSS on RA    Pertinent Vitals/ Pain       Pain Assessment Pain Assessment: No/denies pain  Home Living                                          Prior Functioning/Environment              Frequency  Min 2X/week        Progress Toward Goals  OT Goals(current goals can now be found in the care plan section)  Progress towards OT goals: Progressing toward goals  Acute Rehab OT Goals Patient Stated Goal: To go to rehab OT Goal Formulation: With patient/family Time For Goal Achievement: 03/15/23 Potential to Achieve Goals: Fair  Plan Discharge plan remains appropriate;Frequency remains appropriate    Co-evaluation                 AM-PAC OT "6 Clicks" Daily Activity     Outcome Measure   Help from another person eating meals?: A Little Help from another person taking care of personal grooming?: A Little Help from another person toileting, which includes using toliet, bedpan, or urinal?: A Lot Help from another person bathing (including washing, rinsing, drying)?: A Lot Help from another person to put on and taking off regular upper body clothing?: A Little Help from another person to put on and taking off regular lower body clothing?: A Little 6 Click Score: 16    End of Session Equipment Utilized During Treatment: Gait belt  OT Visit Diagnosis: Unsteadiness on feet (R26.81);Other abnormalities of gait and mobility (R26.89);Muscle weakness (generalized) (M62.81)   Activity Tolerance Patient tolerated treatment well   Patient Left in bed;with call bell/phone within reach;with family/visitor present   Nurse Communication Mobility status        Time: 2130-8657 OT Time Calculation (min): 37 min  Charges: OT General Charges $OT Visit: 1 Visit OT Treatments $Self Care/Home Management : 8-22 mins $Therapeutic Activity: 8-22  mins  03/04/2023  AB, OTR/L  Acute Rehabilitation Services  Office: [phone]   Tristan Schroeder 03/04/2023, 5:16 PM

## 2023-03-04 NOTE — Progress Notes (Signed)
Patient with hypoglycemic reading of 44. Repeat shows 46. Patient is asymptomatic but glucose gel given and patient eating lunch with speech therapy. Will repeat CBG

## 2023-03-04 NOTE — Progress Notes (Signed)
PROGRESS NOTE    [name]  OZH:[id number] DOB: Jul 09, 1951 DOA: 02/25/2023 PCP: Eden Emms, NP   Brief Narrative:  72 year old female with history of colon cancer thousand +3, status post partial colectomy and chemotherapy, history of liver cirrhosis felt secondary to previous chemotherapy with oxaliplatin, s/p TIPS procedure at [facility] in 2012, hepatic encephalopathy, came to the hospital with confusion for past 2 days. As per patient has been patient broke her left wrist in March at that time she was started on opioid-based analgesics which caused constipation.  Patient's dose of lactulose was increased from 45 cc 3 times a day to 60 cc 3 times a day. Despite this patient was not having bowel movements, she has been incontinent of urine and stools. Patient has been more altered with nausea vomiting for past 2 days.  Missed dose of lactulose.  Was found to have temperature of 94 F at home. In the ED she was found to be lethargic and hypothermic.  Bair hugger was applied and hypothermia resolved. UA was positive for pyuria, ammonia was elevated at 163.    Assessment & Plan:   Principal Problem:   Acute hepatic encephalopathy Active Problems:   Decompensated liver cirrhosis with portal HTN and gastric varices   AKI (acute kidney injury)   Hypothyroidism   Hypertension   Hx of colon cancer, stage III   Bradycardia   Anemia   Acute cystitis with hematuria   Protein-calorie malnutrition, severe  Acute hepatic encephalopathy -Ammonia level elevated at 163 on admission but it has now improved to 40 today.  She is fully alert and oriented.  Continue lactulose every 6 hours.   Hypoglycemia: Improved.  She was on tube feeds but per nurses, the tube is clogged and they have been unable to unclog it despite of following the protocols.  Patient is fully alert and oriented.  She is able to eat now.  We will try to declog it 1 more time, if unsuccessful, plan is to remove the tube  today and allow patient to eat and do the calorie counting for next 24 hours.  Hopefully we will not need tube feeds again.  I have been informed that patient has ate 50% of the breakfast.   Hypothermia: Secondary to acute hepatic encephalopathy.  Now resolved.   Acute kidney injury: Resolved.  Continue IV fluids.  Hypotension: Blood pressure is improving.  She is on midodrine which we will continue.   Liver cirrhosis with portal hypertension s/p TIPS procedure in 2012 -Status post TIPS procedure in 2012. Developed liver cirrhosis after chemotherapy with oxaliplatin -MELD score 15, 6% estimated 76-month mortality    UTI ruled out: UA not impressive and urine culture negative.  Will discontinue Rocephin.  Hypothyroidism: Continue Synthroid.  Hypotension: Blood pressure slightly low.  She is on midodrine 5 mg.  Will increase to 10 mg.  Pancytopenia: Likely chemotherapy-induced.  Stable.  SVT episodes with bradycardia, new : While at [facility], patient had episodes of bradycardia with pauses, HR lowest 34, given atropine 1mg  x1.  After discussing with cardiology as well as PCCM, cardiology recommended transferring to ICU at [facility] for possible pacing.  However since arrival to [facility], patient's rhythm has mainly been normal sinus rhythm so cardiology has now recommended supportive care and has signed off.  Patient is asymptomatic.   Hx of colon cancer, stage III - status post partial colectomy and chemotherapy, history of liver cirrhosis felt secondary to previous chemotherapy with oxaliplatin,  s/p TIPS procedure   Severe protein calorie malnutrition: Core track tube is clogged.  We will allow her to eat orally.  Disposition: Patient has been seen by PT OT and they recommended CIR.  Patient is being followed by CIR for potential admission.  DVT prophylaxis: enoxaparin (LOVENOX) injection 40 mg Start: 03/02/23 2000 SCDs Start: 02/26/23 0035   Code Status: Full Code  Family  Communication: Husband present at bedside.  Plan of care discussed with patient in length and he/she verbalized understanding and agreed with it.  Status is: Inpatient Remains inpatient appropriate because: Improving.  CIR following for potential admission.  She will be medically stable for discharge tomorrow.   Estimated body mass index is 25.35 kg/m as calculated from the following:   Height as of this encounter: 5\' 4"  (1.626 m).   Weight as of this encounter: 67 kg.    Nutritional Assessment: Body mass index is 25.35 kg/m.Marland Kitchen Seen by dietician.  I agree with the assessment and plan as outlined below: Nutrition Status: Nutrition Problem: Severe Malnutrition Etiology: acute illness Signs/Symptoms: moderate fat depletion, moderate muscle depletion, energy intake < or equal to 50% for > or equal to 5 days, percent weight loss (14.6% in 3 weeks) Percent weight loss: 14.6 % (in 3 weeks) Interventions: Refer to RD note for recommendations  . Skin Assessment: I have examined the patient's skin and I agree with the wound assessment as performed by the wound care RN as outlined below:    Consultants:  GI  Procedures:  None  Antimicrobials:  Anti-infectives (From admission, onward)    Start     Dose/Rate Route Frequency Ordered Stop   03/02/23 1200  rifaximin (XIFAXAN) tablet 550 mg        550 mg Oral 2 times daily 03/02/23 1039     02/26/23 2200  cefTRIAXone (ROCEPHIN) 2 g in sodium chloride 0.9 % 100 mL IVPB  Status:  Discontinued        2 g 200 mL/hr over 30 Minutes Intravenous Every 24 hours 02/26/23 0034 02/27/23 0953   02/25/23 2200  cefTRIAXone (ROCEPHIN) 1 g in sodium chloride 0.9 % 100 mL IVPB  Status:  Discontinued        1 g 200 mL/hr over 30 Minutes Intravenous Every 24 hours 02/25/23 2118 02/26/23 0034         Subjective: Patient seen and examined.  She is fully alert and oriented although slow in response.  Has been the bedside tells me that this is significant  improvement.  Objective: Vitals:   03/04/23 0020 03/04/23 0419 03/04/23 0738 03/04/23 0853  BP: (!) 101/46 (!) 99/43 (!) 96/52 109/74  Pulse: 63 (!) 59 61 64  Resp: 18 18 18 17   Temp: 97.6 F (36.4 C) 98 F (36.7 C) 99.2 F (37.3 C) 98 F (36.7 C)  TempSrc: Axillary Oral Axillary Oral  SpO2: 96% 94% 94% 95%  Weight:  67 kg    Height:        Intake/Output Summary (Last 24 hours) at 03/04/2023 1106 Last data filed at 03/04/2023 0819 Gross per 24 hour  Intake 436.5 ml  Output 225 ml  Net 211.5 ml    Filed Weights   03/03/23 0500 03/03/23 2105 03/04/23 0419  Weight: 62.6 kg 66.1 kg 67 kg    Examination:  General exam: Appears calm and comfortable  Respiratory system: Clear to auscultation. Respiratory effort normal. Cardiovascular system: S1 & S2 heard, RRR. No JVD, murmurs, rubs, gallops or clicks. No  pedal edema. Gastrointestinal system: Abdomen is nondistended, soft and nontender. No organomegaly or masses felt. Normal bowel sounds heard. Central nervous system: Alert and oriented. No focal neurological deficits. Extremities: Symmetric 5 x 5 power. Skin: No rashes, lesions or ulcers.   Data Reviewed: I have personally reviewed following labs and imaging studies  CBC: Recent Labs  Lab 02/25/23 2025 02/26/23 0214 02/27/23 0254 02/28/23 0305 03/01/23 0309 03/02/23 0438 03/03/23 0139  WBC 4.1   < > 3.0* 2.3* 6.4 4.4 3.3*  NEUTROABS 2.7  --  1.6* 1.3* 5.1  --   --   HGB 12.1   < > 9.2* 9.4* 10.2* 9.8* 10.2*  HCT 36.4   < > 28.5* 29.7* 31.8* 30.5* 29.8*  MCV 102.8*   < > 106.7* 110.4* 109.7* 109.7* 104.9*  PLT 142*   < > 101* 78* 106* 105* 127*   < > = values in this interval not displayed.    Basic Metabolic Panel: Recent Labs  Lab 03/01/23 0309 03/01/23 1259 03/02/23 0438 03/02/23 1501 03/02/23 1709 03/03/23 0139 03/03/23 1635 03/04/23 0541  NA 145  --  143 141  --  138  --  138  K 3.7  --  3.7 3.9  --  3.7  --  4.0  CL 119*  --  115* 115*  --   112*  --  109  CO2 19*  --  21* 22  --  21*  --  20*  GLUCOSE 99  --  89 91  --  93  --  98  BUN 10  --  13 16  --  14  --  20  CREATININE 0.63  --  0.69 0.78  --  0.72  --  0.72  CALCIUM 9.3  --  9.5 9.1  --  9.1  --  9.1  MG  --    < > 1.9 1.8 1.9 2.4 1.8 1.8  PHOS  --    < > 2.8  --  3.1 2.9 3.0 3.0   < > = values in this interval not displayed.    GFR: Estimated Creatinine Clearance: 60.7 mL/min (by C-G formula based on SCr of 0.72 mg/dL). Liver Function Tests: Recent Labs  Lab 02/28/23 0305 03/01/23 0309 03/02/23 0438 03/03/23 0139 03/04/23 0541  AST 41 40 39 40 44*  ALT 28 28 26 26 26   ALKPHOS 144* 157* 170* 191* 201*  BILITOT 2.2* 2.9* 2.6* 2.8* 2.5*  PROT 6.9 6.5 6.3* 6.3* 6.2*  ALBUMIN 4.5 4.0 3.6 3.3* 3.2*    No results for input(s): "LIPASE", "AMYLASE" in the last 168 hours. Recent Labs  Lab 02/28/23 0305 03/01/23 0309 03/02/23 0438 03/03/23 0139 03/04/23 0541  AMMONIA 66* 50* 45* 76* 44*    Coagulation Profile: Recent Labs  Lab 02/27/23 1143 02/28/23 0305 03/01/23 0309 03/02/23 0438 03/03/23 0139  INR 1.9* 2.1* 2.1* 2.1* 1.8*    Cardiac Enzymes: No results for input(s): "CKTOTAL", "CKMB", "CKMBINDEX", "TROPONINI" in the last 168 hours. BNP (last 3 results) No results for input(s): "PROBNP" in the last 8760 hours. HbA1C: No results for input(s): "HGBA1C" in the last 72 hours. CBG: Recent Labs  Lab 03/03/23 0552 03/03/23 1125 03/03/23 1609 03/03/23 1945 03/04/23 0428  GLUCAP 88 76 71 78 77    Lipid Profile: No results for input(s): "CHOL", "HDL", "LDLCALC", "TRIG", "CHOLHDL", "LDLDIRECT" in the last 72 hours. Thyroid Function Tests: No results for input(s): "TSH", "T4TOTAL", "FREET4", "T3FREE", "THYROIDAB" in the last 72 hours.  Anemia  Panel: No results for input(s): "VITAMINB12", "FOLATE", "FERRITIN", "TIBC", "IRON", "RETICCTPCT" in the last 72 hours. Sepsis Labs: Recent Labs  Lab 02/26/23 0948  LATICACIDVEN 1.3     Recent  Results (from the past 240 hour(s))  Urine Culture     Status: None   Collection Time: 02/25/23  8:06 PM   Specimen: Urine, Clean Catch  Result Value Ref Range Status   Specimen Description   Final    URINE, CLEAN CATCH Performed at [facility], [address] [place], [place] [address]    Special Requests   Final    NONE Performed at [facility], [address] [place], [place] [address]    Culture   Final    NO GROWTH Performed at [facility], [address], [place], [place] [address]    Report Status 02/26/2023 FINAL  Final  MRSA Next Gen by PCR, Nasal     Status: None   Collection Time: 02/26/23 12:16 AM   Specimen: Nasal Mucosa; Nasal Swab  Result Value Ref Range Status   MRSA by PCR Next Gen NOT DETECTED NOT DETECTED Final    Comment: (NOTE) The GeneXpert MRSA Assay (FDA approved for NASAL specimens only), is one component of a comprehensive MRSA colonization surveillance program. It is not intended to diagnose MRSA infection nor to guide or monitor treatment for MRSA infections. Test performance is not FDA approved in patients less than 64 years old. Performed at [facility], [address] [place], [place] [address]   Culture, blood (Routine X 2) w Reflex to ID Panel     Status: None   Collection Time: 02/26/23  2:14 AM   Specimen: BLOOD  Result Value Ref Range Status   Specimen Description   Final    BLOOD LEFT ANTECUBITAL Performed at [facility], [address], [place], [place] [address]    Special Requests   Final    BOTTLES DRAWN AEROBIC AND ANAEROBIC Blood Culture adequate volume Performed at [facility], [address] [place], [place] [address]    Culture   Final    NO GROWTH 5 DAYS Performed at [facility], [address] [place], [place] [address]    Report Status 03/03/2023 FINAL  Final  Culture, blood (Routine X 2) w Reflex to ID  Panel     Status: None   Collection Time: 02/26/23  2:14 AM   Specimen: BLOOD  Result Value Ref Range Status   Specimen Description   Final    BLOOD LEFT ANTECUBITAL Performed at [facility], [address] [place], [place] [address]    Special Requests   Final    BOTTLES DRAWN AEROBIC AND ANAEROBIC Blood Culture adequate volume Performed at [facility], [address], [place], [place] [address]    Culture   Final    NO GROWTH 5 DAYS Performed at [facility], [address], [place], [place] [address]    Report Status 03/03/2023 FINAL  Final  MRSA Next Gen by PCR, Nasal     Status: None   Collection Time: 03/02/23  7:40 PM   Specimen: Nasal Mucosa; Nasal Swab  Result Value Ref Range Status   MRSA by PCR Next Gen NOT DETECTED NOT DETECTED Final    Comment: (NOTE) The GeneXpert MRSA Assay (FDA approved for NASAL specimens only), is one component of a comprehensive MRSA colonization surveillance program. It  is not intended to diagnose MRSA infection nor to guide or monitor treatment for MRSA infections. Test performance is not FDA approved in patients less than 54 years old. Performed at [facility], [address], [place], [place] [address]      Radiology Studies: ECHOCARDIOGRAM COMPLETE  Result Date: 03/03/2023    ECHOCARDIOGRAM REPORT   Patient Name:   REMEIGH DITSWORTH Date of Exam: 03/03/2023 Medical Rec #:  [id number]       Height:       64.0 in Accession #:    [id number]      Weight:       138.0 lb Date of Birth:  29-Sep-1951        BSA:          1.671 m Patient Age:    71 years        BP:           100/57 mmHg Patient Gender: F               HR:           52 bpm. Exam Location:  Inpatient Procedure: 2D Echo, Cardiac Doppler and Color Doppler Indications:    Other cardiac sounds R01.2  History:        Patient has prior history of Echocardiogram examinations, most                 recent 08/16/2020. Cancer,  Arrythmias:Bradycardia,                 Signs/Symptoms:Murmur; Risk Factors:Hypertension.  Sonographer:    Aron Baba Referring Phys: [id number] RIPUDEEP K RAI  Sonographer Comments: Image acquisition challenging due to patient body habitus and Image acquisition challenging due to respiratory motion. IMPRESSIONS  1. Descending thoracic aorta dilated (4.1 cm); suggest CTA to better assess.  2. Left ventricular ejection fraction, by estimation, is 60 to 65%. The left ventricle has normal function. The left ventricle has no regional wall motion abnormalities. Left ventricular diastolic parameters are consistent with Grade I diastolic dysfunction (impaired relaxation). Elevated left atrial pressure.  3. Right ventricular systolic function is normal. The right ventricular size is mildly enlarged. There is mildly elevated pulmonary artery systolic pressure.  4. Left atrial size was moderately dilated.  5. The mitral valve is normal in structure. Trivial mitral valve regurgitation. No evidence of mitral stenosis. Moderate mitral annular calcification.  6. The aortic valve is tricuspid. Aortic valve regurgitation is not visualized. Aortic valve sclerosis is present, with no evidence of aortic valve stenosis.  7. The inferior vena cava is dilated in size with <50% respiratory variability, suggesting right atrial pressure of 15 mmHg. FINDINGS  Left Ventricle: Left ventricular ejection fraction, by estimation, is 60 to 65%. The left ventricle has normal function. The left ventricle has no regional wall motion abnormalities. The left ventricular internal cavity size was normal in size. There is  no left ventricular hypertrophy. Left ventricular diastolic parameters are consistent with Grade I diastolic dysfunction (impaired relaxation). Elevated left atrial pressure. Right Ventricle: The right ventricular size is mildly enlarged. Right ventricular systolic function is normal. There is mildly elevated pulmonary artery systolic  pressure. The tricuspid regurgitant velocity is 2.57 m/s, and with an assumed right atrial pressure of 15 mmHg, the estimated right ventricular systolic pressure is 41.4 mmHg. Left Atrium: Left atrial size was moderately dilated. Right Atrium: Right atrial size was normal in size. Pericardium: There is no evidence of pericardial effusion. Mitral Valve:  The mitral valve is normal in structure. Moderate mitral annular calcification. Trivial mitral valve regurgitation. No evidence of mitral valve stenosis. Tricuspid Valve: The tricuspid valve is normal in structure. Tricuspid valve regurgitation is mild . No evidence of tricuspid stenosis. Aortic Valve: The aortic valve is tricuspid. Aortic valve regurgitation is not visualized. Aortic valve sclerosis is present, with no evidence of aortic valve stenosis. Pulmonic Valve: The pulmonic valve was normal in structure. Pulmonic valve regurgitation is trivial. No evidence of pulmonic stenosis. Aorta: The aortic root is normal in size and structure. Venous: The inferior vena cava is dilated in size with less than 50% respiratory variability, suggesting right atrial pressure of 15 mmHg. IAS/Shunts: No atrial level shunt detected by color flow Doppler. Additional Comments: Descending thoracic aorta dilated (4.1 cm); suggest CTA to better assess.  LEFT VENTRICLE PLAX 2D LVIDd:         4.90 cm   Diastology LVIDs:         3.40 cm   LV e' medial:    6.53 cm/s LV PW:         1.30 cm   LV E/e' medial:  18.4 LV IVS:        0.60 cm   LV e' lateral:   7.18 cm/s LVOT diam:     1.50 cm   LV E/e' lateral: 16.7 LV SV:         64 LV SV Index:   38 LVOT Area:     1.77 cm  RIGHT VENTRICLE RV S prime:     10.90 cm/s TAPSE (M-mode): 2.4 cm LEFT ATRIUM             Index        RIGHT ATRIUM           Index LA diam:        3.80 cm 2.27 cm/m   RA Area:     15.50 cm LA Vol (A2C):   74.8 ml 44.76 ml/m  RA Volume:   37.00 ml  22.14 ml/m LA Vol (A4C):   77.4 ml 46.32 ml/m LA Biplane Vol: 82.7 ml  49.49 ml/m  AORTIC VALVE             PULMONIC VALVE LVOT Vmax:   140.00 cm/s PR End Diast Vel: 1.05 msec LVOT Vmean:  92.700 cm/s LVOT VTI:    0.361 m  AORTA Ao Root diam: 3.10 cm Ao Asc diam:  2.90 cm MITRAL VALVE                TRICUSPID VALVE MV Area (PHT): 2.42 cm     TR Peak grad:   26.4 mmHg MV Decel Time: 313 msec     TR Vmax:        257.00 cm/s MV E velocity: 120.00 cm/s MV A velocity: 126.00 cm/s  SHUNTS MV E/A ratio:  0.95         Systemic VTI:  0.36 m                             Systemic Diam: 1.50 cm Olga Millers MD Electronically signed by Olga Millers MD Signature Date/Time: 03/03/2023/3:21:44 PM    Final    DG Abd Portable 1V  Result Date: 03/03/2023 CLINICAL DATA:  Feeding tube placement EXAM: PORTABLE ABDOMEN - 1 VIEW COMPARISON:  02/27/2023 FINDINGS: Tip of feeding tube is seen in the region of body of the stomach. Bowel gas pattern  is nonspecific. Vascular stent is noted overlying the liver. Increased interstitial markings are seen in the lower lung fields suggesting interstitial edema or interstitial pneumonia. Left hemidiaphragm is elevated. IMPRESSION: Tip of feeding tube is seen in the stomach. Electronically Signed   By: Ernie Avena M.D.   On: 03/03/2023 13:04    Scheduled Meds:  Chlorhexidine Gluconate Cloth  6 each Topical Daily   enoxaparin (LOVENOX) injection  40 mg Subcutaneous Q24H   feeding supplement  237 mL Oral TID BM   feeding supplement (OSMOLITE 1.5 CAL)  1,000 mL Per Tube Q24H   feeding supplement (PROSource TF20)  60 mL Per Tube Daily   lactulose  30 g Oral Q6H   [START ON 03/05/2023] levothyroxine  75 mcg Oral Q0600   lipase/protease/amylase)  20,880 Units Per Tube Once   And   sodium bicarbonate  650 mg Per Tube Once   metoCLOPramide (REGLAN) injection  10 mg Intravenous Q6H   midodrine  10 mg Oral BID WC   mouth rinse  15 mL Mouth Rinse 4 times per day   rifaximin  550 mg Oral BID   thiamine  100 mg Oral Daily   zinc sulfate  220 mg Oral BID    Continuous Infusions:     LOS: 7 days   Hughie Closs, MD [facility]  03/04/2023, 11:06 AM   *Please note that this is a verbal dictation therefore any spelling or grammatical errors are due to the "Dragon Medical One" system interpretation.  Please page via Amion and do not message via secure chat for urgent patient care matters. Secure chat can be used for non urgent patient care matters.  How to contact the [facility] Attending or Consulting provider 7A - 7P or covering provider during after hours 7P -7A, for this patient?  Check the care team in [facility] and look for a) attending/consulting [facility] provider listed and b) the [facility] team listed. Page or secure chat 7A-7P. Log into [email or web address] and use [place]'s universal password to access. If you do not have the password, please contact the hospital operator. Locate the [facility] provider you are looking for under [facility] and page to a number that you can be directly reached. If you still have difficulty reaching the provider, please page the [facility] (Director on Call) for the Hospitalists listed on amion for assistance.

## 2023-03-04 NOTE — Progress Notes (Signed)
Protocol to fix clogged Cortrak was unsuccessful. Notified Dr. Loney Loh.

## 2023-03-04 NOTE — Progress Notes (Signed)
   03/04/23 0029  NG/OG Vented/Dual Lumen 10 Fr. Left nare Marking at nare/corner of mouth 72 cm  Placement Date/Time: 02/26/23 1545   Person Inserting LDA: Rodney Langton RN  Tube Size (Fr.): 10 Fr.  Tube Location: Left nare  Secured by: Tape  Initial Placement Verification: Xray  Tube Position (Required): Marking at nare/corner of mouth  Measureme...  Intake (mL) 0 mL     Feeding tube clogged, protocol was initiated to de-clog per order. Primary RN informed.

## 2023-03-05 DIAGNOSIS — K7682 Hepatic encephalopathy: Secondary | ICD-10-CM | POA: Diagnosis not present

## 2023-03-05 LAB — MAGNESIUM: Magnesium: 1.8 mg/dL (ref 1.7–2.4)

## 2023-03-05 LAB — COPPER, SERUM: Copper: 48 ug/dL — ABNORMAL LOW (ref 80–158)

## 2023-03-05 LAB — GLUCOSE, CAPILLARY
Glucose-Capillary: 118 mg/dL — ABNORMAL HIGH (ref 70–99)
Glucose-Capillary: 63 mg/dL — ABNORMAL LOW (ref 70–99)
Glucose-Capillary: 73 mg/dL (ref 70–99)
Glucose-Capillary: 79 mg/dL (ref 70–99)
Glucose-Capillary: 87 mg/dL (ref 70–99)
Glucose-Capillary: 90 mg/dL (ref 70–99)
Glucose-Capillary: 94 mg/dL (ref 70–99)
Glucose-Capillary: 98 mg/dL (ref 70–99)

## 2023-03-05 LAB — ZINC: Zinc: 96 ug/dL (ref 44–115)

## 2023-03-05 LAB — PHOSPHORUS: Phosphorus: 3 mg/dL (ref 2.5–4.6)

## 2023-03-05 MED ORDER — COPPER 2 MG PO TABS
6.0000 mg | Freq: Every day | Status: AC
  Administered 2023-03-05 – 2023-03-09 (×5): 6 mg via ORAL
  Filled 2023-03-05 (×5): qty 3

## 2023-03-05 MED ORDER — PROSOURCE PLUS PO LIQD
30.0000 mL | Freq: Two times a day (BID) | ORAL | Status: DC
  Administered 2023-03-05 – 2023-03-08 (×6): 30 mL via ORAL
  Filled 2023-03-05 (×6): qty 30

## 2023-03-05 NOTE — Progress Notes (Signed)
Inpatient Rehab Admissions Coordinator:   Pt and spouse on board to pursue potential CIR admit. I will start insurance auth request today.  Will follow.   Estill Dooms, PT, DPT Admissions Coordinator [phone] 03/05/23  12:32 PM

## 2023-03-05 NOTE — Progress Notes (Signed)
Nutrition Brief Note  Micronutrient labs ordered: CRP: 1.4 (H) Copper: 48 (L) Zinc: 96 (wdl)  Given zinc WDL and can cause copper deficient, recommend discontinuing zinc supplementation and copper repletion given copper lab resulted low.   Reached out to MD with recommendations.   Drusilla Kanner, RDN, LDN Clinical Nutrition

## 2023-03-05 NOTE — Progress Notes (Signed)
Calorie Count Note  24 hour calorie count ordered.  Diet: Regular diet Supplements:  Ensure Enlive po TID, each supplement provides 350 kcal and 20 grams of protein.  Breakfast: 255 kcal and 10g protein Lunch: 317 kcal and 5g protein Dinner: 58 kcal and 8g protein Supplements: (2 Ensure consumed) 700 kcal and 40g protein  Total intake: 1330 kcal (70% of minimum estimated needs)  63 protein (70% of minimum estimated needs)  Pt has met ~70% of her estimated calorie and protein needs yesterday. She continues to consume Ensure supplements. Placed order for ProSource Oral TID as she was being provided ProSource TF via PO. Pt's husband states that she was really tired yesterday after walking and working with PT so her dinner was minimal yesterday. Pt's husband reports plans to transfer to CIR. Will continue to follow and monitor pt's PO intake throughout admission.   Nutrition Dx: Severe Malnutrition related to acute illness as evidenced by moderate fat depletion, moderate muscle depletion, energy intake < or equal to 50% for > or equal to 5 days, percent weight loss (14.6% in 3 weeks).   Goal: Patient will meet greater than or equal to 90% of their needs   Intervention:  Ensure Enlive po TID, each supplement provides 350 kcal and 20 grams of protein.   Encourage po intake, with improved mental status hopefully can advance to Regular diet soon. Discussed with RN  30 ml ProSource Plus BID, each supplement provides 100 kcals and 15 grams protein.   Drusilla Kanner, RDN, LDN Clinical Nutrition

## 2023-03-05 NOTE — Progress Notes (Signed)
Physical Therapy Treatment Patient Details Name: [name] MRN: [id number] DOB: 1951/11/08 Today's Date: 03/05/2023   History of Present Illness Patient is 72 year old female admitted on 02/25/23 with acute hepatic encephalopathy. Pt with fall in March 2024 that resulted in L wrist fx.  She was on opioids which caused constipation and even with increased lactulose she was not having bowel movements and developed encephalopathy and hypothermia.  Pt with hx including but not limited to colon CA, liver cirrhosis, TIPS, hepatic encephalopathy.    PT Comments    Pt tolerated treatment well today. Pt was able to progress ambulation in hallway with 1 person HHA and a chair follow. Pt anticipates DC to CIR soon. No change in DC/DME recs at this time. PT will continue to follow.   Recommendations for follow up therapy are one component of a multi-disciplinary discharge planning process, led by the attending physician.  Recommendations may be updated based on patient status, additional functional criteria and insurance authorization.  Follow Up Recommendations  Can patient physically be transported by private vehicle: No    Assistance Recommended at Discharge Frequent or constant Supervision/Assistance  Patient can return home with the following A little help with walking and/or transfers;A little help with bathing/dressing/bathroom;Assistance with cooking/housework;Assist for transportation;Help with stairs or ramp for entrance;Direct supervision/assist for medications management;Direct supervision/assist for financial management   Equipment Recommendations  Other (comment)    Recommendations for Other Services       Precautions / Restrictions Precautions Precautions: Fall Restrictions Weight Bearing Restrictions: Yes LUE Weight Bearing: Non weight bearing     Mobility  Bed Mobility               General bed mobility comments: Pt up in chair    Transfers Overall transfer  level: Needs assistance Equipment used: 1 person hand held assist Transfers: Sit to/from Stand Sit to Stand: Min assist                Ambulation/Gait Ambulation/Gait assistance: Min assist, +2 safety/equipment (Chair follow) Gait Distance (Feet): 150 Feet Assistive device: 1 person hand held assist Gait Pattern/deviations: Step-through pattern, Decreased stride length, Narrow base of support Gait velocity: decreased     General Gait Details: Pt with slow cautious gait. No LOB noted   Stairs             Wheelchair Mobility    Modified Rankin (Stroke Patients Only)       Balance Overall balance assessment: Mild deficits observed, not formally tested                                          Cognition Arousal/Alertness: Awake/alert Behavior During Therapy: WFL for tasks assessed/performed Overall Cognitive Status: Within Functional Limits for tasks assessed                                          Exercises      General Comments General comments (skin integrity, edema, etc.): BP:100/55 prior to ambulation. BP: 113/51 after ambulation      Pertinent Vitals/Pain Pain Assessment Pain Assessment: No/denies pain    Home Living                          Prior Function  PT Goals (current goals can now be found in the care plan section) Progress towards PT goals: Progressing toward goals    Frequency    Min 1X/week      PT Plan Current plan remains appropriate    Co-evaluation              AM-PAC PT "6 Clicks" Mobility   Outcome Measure  Help needed turning from your back to your side while in a flat bed without using bedrails?: A Little Help needed moving from lying on your back to sitting on the side of a flat bed without using bedrails?: A Little Help needed moving to and from a bed to a chair (including a wheelchair)?: A Little Help needed standing up from a chair using your  arms (e.g., wheelchair or bedside chair)?: A Little Help needed to walk in hospital room?: A Lot Help needed climbing 3-5 steps with a railing? : Total 6 Click Score: 15    End of Session Equipment Utilized During Treatment: Gait belt Activity Tolerance: Patient tolerated treatment well Patient left: in chair;with call bell/phone within reach;with chair alarm set;with family/visitor present Nurse Communication: Mobility status PT Visit Diagnosis: Other abnormalities of gait and mobility (R26.89);Muscle weakness (generalized) (M62.81)     Time: 8413-2440 PT Time Calculation (min) (ACUTE ONLY): 23 min  Charges:  $Gait Training: 23-37 mins                     Shela Nevin, PT, DPT Acute Rehab Services [phone]    [name] 03/05/2023, 3:18 PM

## 2023-03-05 NOTE — Progress Notes (Signed)
PROGRESS NOTE    [name]  QPY:[id number] DOB: 1951-07-13 DOA: 02/25/2023 PCP: Eden Emms, NP   Brief Narrative:  72 year old female with history of colon cancer thousand +3, status post partial colectomy and chemotherapy, history of liver cirrhosis felt secondary to previous chemotherapy with oxaliplatin, s/p TIPS procedure at [facility] in 2012, hepatic encephalopathy, came to the hospital with confusion for past 2 days. As per patient has been patient broke her left wrist in March at that time she was started on opioid-based analgesics which caused constipation.  Patient's dose of lactulose was increased from 45 cc 3 times a day to 60 cc 3 times a day. Despite this patient was not having bowel movements, she has been incontinent of urine and stools. Patient has been more altered with nausea vomiting for past 2 days.  Missed dose of lactulose.  Was found to have temperature of 94 F at home. In the ED she was found to be lethargic and hypothermic.  Bair hugger was applied and hypothermia resolved. UA was positive for pyuria, ammonia was elevated at 163.    Assessment & Plan:   Principal Problem:   Acute hepatic encephalopathy Active Problems:   Decompensated liver cirrhosis with portal HTN and gastric varices   AKI (acute kidney injury)   Hypothyroidism   Hypertension   Hx of colon cancer, stage III   Bradycardia   Anemia   Acute cystitis with hematuria   Protein-calorie malnutrition, severe  Acute hepatic encephalopathy -Ammonia level elevated at 163 on admission but it has now improved to 40 today.  She is fully alert and oriented.  Continue lactulose every 6 hours.   Hypoglycemia:She was on tube feeds but she is eating well now and hypoglycemia resolved.   Hypothermia: Secondary to acute hepatic encephalopathy.  Now resolved.   Acute kidney injury: Resolved.    Hypotension: Blood pressure is improving.  Continue midodrine.   Liver cirrhosis with portal  hypertension s/p TIPS procedure in 2012 -Status post TIPS procedure in 2012. Developed liver cirrhosis after chemotherapy with oxaliplatin -MELD score 15, 6% estimated 60-month mortality    UTI ruled out: UA not impressive and urine culture negative.  Will discontinue Rocephin.  Hypothyroidism: Continue Synthroid.  Pancytopenia: Likely chemotherapy-induced.  Stable.  SVT episodes with bradycardia, new : While at [facility] long, patient had episodes of bradycardia with pauses, HR lowest 34, given atropine 1mg  x1.  After discussing with cardiology as well as PCCM, cardiology recommended transferring to ICU at [facility] for possible pacing.  However since arrival to [facility], patient's rhythm has mainly been normal sinus rhythm so cardiology has now recommended supportive care and has signed off.  Patient is asymptomatic.   Hx of colon cancer, stage III - status post partial colectomy and chemotherapy, history of liver cirrhosis felt secondary to previous chemotherapy with oxaliplatin, s/p TIPS procedure   Severe protein calorie malnutrition: RD on board.   Disposition: Patient has been seen by PT OT and they recommended CIR.  Patient is being followed by CIR for potential admission.  DVT prophylaxis: enoxaparin (LOVENOX) injection 40 mg Start: 03/02/23 2000 SCDs Start: 02/26/23 0035   Code Status: Full Code  Family Communication: Husband present at bedside.  Plan of care discussed with patient in length and he/she verbalized understanding and agreed with it.  Status is: Inpatient Remains inpatient appropriate because: Improving.  CIR following for potential admission.  Insurance authorization started and pending.  Patient is now medically stable.  Estimated body mass index is 25.35 kg/m as calculated from the following:   Height as of this encounter: 5\' 4"  (1.626 m).   Weight as of this encounter: 67 kg.    Nutritional Assessment: Body mass index is 25.35 kg/m.Marland Kitchen Seen by dietician.   I agree with the assessment and plan as outlined below: Nutrition Status: Nutrition Problem: Severe Malnutrition Etiology: acute illness Signs/Symptoms: moderate fat depletion, moderate muscle depletion, energy intake < or equal to 50% for > or equal to 5 days, percent weight loss (14.6% in 3 weeks) Percent weight loss: 14.6 % (in 3 weeks) Interventions: Refer to RD note for recommendations  . Skin Assessment: I have examined the patient's skin and I agree with the wound assessment as performed by the wound care RN as outlined below:    Consultants:  GI  Procedures:  None  Antimicrobials:  Anti-infectives (From admission, onward)    Start     Dose/Rate Route Frequency Ordered Stop   03/02/23 1200  rifaximin (XIFAXAN) tablet 550 mg        550 mg Oral 2 times daily 03/02/23 1039     02/26/23 2200  cefTRIAXone (ROCEPHIN) 2 g in sodium chloride 0.9 % 100 mL IVPB  Status:  Discontinued        2 g 200 mL/hr over 30 Minutes Intravenous Every 24 hours 02/26/23 0034 02/27/23 0953   02/25/23 2200  cefTRIAXone (ROCEPHIN) 1 g in sodium chloride 0.9 % 100 mL IVPB  Status:  Discontinued        1 g 200 mL/hr over 30 Minutes Intravenous Every 24 hours 02/25/23 2118 02/26/23 0034         Subjective: Patient seen and examined.  She has no complaints.  She is fully alert and oriented and back at baseline.  Husband at bedside also agrees with that.  Objective: Vitals:   03/05/23 0009 03/05/23 0353 03/05/23 0900 03/05/23 1127  BP: (!) 100/56 (!) 92/43 (!) 98/46 (!) 112/43  Pulse: (!) 55 (!) 53 (!) 58 60  Resp: 19 19 15 14   Temp: 98 F (36.7 C) 98.1 F (36.7 C)  98 F (36.7 C)  TempSrc: Oral Oral  Oral  SpO2: 97% 96% 100%   Weight:      Height:        Intake/Output Summary (Last 24 hours) at 03/05/2023 1323 Last data filed at 03/05/2023 0840 Gross per 24 hour  Intake 240 ml  Output 300 ml  Net -60 ml    Filed Weights   03/03/23 0500 03/03/23 2105 03/04/23 0419  Weight: 62.6  kg 66.1 kg 67 kg    Examination:  General exam: Appears calm and comfortable  Respiratory system: Clear to auscultation. Respiratory effort normal. Cardiovascular system: S1 & S2 heard, RRR. No JVD, murmurs, rubs, gallops or clicks. No pedal edema. Gastrointestinal system: Abdomen is nondistended, soft and nontender. No organomegaly or masses felt. Normal bowel sounds heard. Central nervous system: Alert and oriented. No focal neurological deficits. Extremities: Symmetric 5 x 5 power. Skin: No rashes, lesions or ulcers.  Psychiatry: Judgement and insight appear normal. Mood & affect appropriate.   Data Reviewed: I have personally reviewed following labs and imaging studies  CBC: Recent Labs  Lab 02/27/23 0254 02/28/23 0305 03/01/23 0309 03/02/23 0438 03/03/23 0139  WBC 3.0* 2.3* 6.4 4.4 3.3*  NEUTROABS 1.6* 1.3* 5.1  --   --   HGB 9.2* 9.4* 10.2* 9.8* 10.2*  HCT 28.5* 29.7* 31.8* 30.5* 29.8*  MCV 106.7*  110.4* 109.7* 109.7* 104.9*  PLT 101* 78* 106* 105* 127*    Basic Metabolic Panel: Recent Labs  Lab 03/01/23 0309 03/01/23 1259 03/02/23 0438 03/02/23 1501 03/02/23 1709 03/03/23 0139 03/03/23 1635 03/04/23 0541 03/04/23 1739 03/05/23 0432  NA 145  --  143 141  --  138  --  138  --   --   K 3.7  --  3.7 3.9  --  3.7  --  4.0  --   --   CL 119*  --  115* 115*  --  112*  --  109  --   --   CO2 19*  --  21* 22  --  21*  --  20*  --   --   GLUCOSE 99  --  89 91  --  93  --  98  --   --   BUN 10  --  13 16  --  14  --  20  --   --   CREATININE 0.63  --  0.69 0.78  --  0.72  --  0.72  --   --   CALCIUM 9.3  --  9.5 9.1  --  9.1  --  9.1  --   --   MG  --    < > 1.9 1.8   < > 2.4 1.8 1.8 1.9 1.8  PHOS  --    < > 2.8  --    < > 2.9 3.0 3.0 3.3 3.0   < > = values in this interval not displayed.    GFR: Estimated Creatinine Clearance: 60.7 mL/min (by C-G formula based on SCr of 0.72 mg/dL). Liver Function Tests: Recent Labs  Lab 02/28/23 0305 03/01/23 0309  03/02/23 0438 03/03/23 0139 03/04/23 0541  AST 41 40 39 40 44*  ALT ALKPHOS 144* 157* 170* 191* 201*  BILITOT 2.2* 2.9* 2.6* 2.8* 2.5*  PROT 6.9 6.5 6.3* 6.3* 6.2*  ALBUMIN 4.5 4.0 3.6 3.3* 3.2*    No results for input(s): "LIPASE", "AMYLASE" in the last 168 hours. Recent Labs  Lab 02/28/23 0305 03/01/23 0309 03/02/23 0438 03/03/23 0139 03/04/23 0541  AMMONIA 66* 50* 45* 76* 44*    Coagulation Profile: Recent Labs  Lab 02/27/23 1143 02/28/23 0305 03/01/23 0309 03/02/23 0438 03/03/23 0139  INR 1.9* 2.1* 2.1* 2.1* 1.8*    Cardiac Enzymes: No results for input(s): "CKTOTAL", "CKMB", "CKMBINDEX", "TROPONINI" in the last 168 hours. BNP (last 3 results) No results for input(s): "PROBNP" in the last 8760 hours. HbA1C: No results for input(s): "HGBA1C" in the last 72 hours. CBG: Recent Labs  Lab 03/05/23 0011 03/05/23 0402 03/05/23 0853 03/05/23 1133 03/05/23 1155  GLUCAP 94 90 118* 63* 73    Lipid Profile: No results for input(s): "CHOL", "HDL", "LDLCALC", "TRIG", "CHOLHDL", "LDLDIRECT" in the last 72 hours. Thyroid Function Tests: No results for input(s): "TSH", "T4TOTAL", "FREET4", "T3FREE", "THYROIDAB" in the last 72 hours.  Anemia Panel: No results for input(s): "VITAMINB12", "FOLATE", "FERRITIN", "TIBC", "IRON", "RETICCTPCT" in the last 72 hours. Sepsis Labs: No results for input(s): "PROCALCITON", "LATICACIDVEN" in the last 168 hours.   Recent Results (from the past 240 hour(s))  Urine Culture     Status: None   Collection Time: 02/25/23  8:06 PM   Specimen: Urine, Clean Catch  Result Value Ref Range Status   Specimen Description   Final    URINE, CLEAN CATCH Performed at [facility], [address] [place], [place] [address]    Special Requests   Final    NONE Performed at [facility], [address], [place], [place] [address]    Culture   Final    NO GROWTH Performed at [facility], [address] [place], [place] [address]    Report Status 02/26/2023 FINAL  Final  MRSA Next Gen by PCR, Nasal     Status: None   Collection Time: 02/26/23 12:16 AM   Specimen: Nasal Mucosa; Nasal Swab  Result Value Ref Range Status   MRSA by PCR Next Gen NOT DETECTED NOT DETECTED Final    Comment: (NOTE) The GeneXpert MRSA Assay (FDA approved for NASAL specimens only), is one component of a comprehensive MRSA colonization surveillance program. It is not intended to diagnose MRSA infection nor to guide or monitor treatment for MRSA infections. Test performance is not FDA approved in patients less than 66 years old. Performed at [facility], [address], [place], [place] [address]   Culture, blood (Routine X 2) w Reflex to ID Panel     Status: None   Collection Time: 02/26/23  2:14 AM   Specimen: BLOOD  Result Value Ref Range Status   Specimen Description   Final    BLOOD LEFT ANTECUBITAL Performed at [facility], [address] [place], [place] [address]    Special Requests   Final    BOTTLES DRAWN AEROBIC AND ANAEROBIC Blood Culture adequate volume Performed at [facility], [address], [place], [place] [address]    Culture   Final    NO GROWTH 5 DAYS Performed at [facility], [address], [place], [place] [address]    Report Status 03/03/2023 FINAL  Final  Culture, blood (Routine X 2) w Reflex to ID Panel     Status: None   Collection Time: 02/26/23  2:14 AM   Specimen: BLOOD  Result Value Ref Range Status   Specimen Description   Final    BLOOD LEFT ANTECUBITAL Performed at [facility], [address], [place], [place] [address]    Special Requests   Final    BOTTLES DRAWN AEROBIC AND ANAEROBIC Blood Culture adequate volume Performed at [facility], [address] [place], [place] [address]    Culture   Final    NO GROWTH 5 DAYS Performed  at [facility], [address], [place], [place] [address]    Report Status 03/03/2023 FINAL  Final  MRSA Next Gen by PCR, Nasal     Status: None   Collection Time: 03/02/23  7:40 PM   Specimen: Nasal Mucosa; Nasal Swab  Result Value Ref Range Status   MRSA by PCR Next Gen NOT DETECTED NOT DETECTED Final    Comment: (NOTE) The GeneXpert MRSA Assay (FDA approved for NASAL specimens only), is one component of a comprehensive MRSA colonization surveillance program. It is not intended to diagnose MRSA infection nor to guide or monitor treatment for MRSA infections. Test performance is not FDA approved in patients less than 49 years old. Performed at [facility], [address] [place], [place] [address]      Radiology Studies: ECHOCARDIOGRAM COMPLETE  Result Date: 03/03/2023    ECHOCARDIOGRAM REPORT   Patient Name:   RADLEY ABRAMSON Date of Exam: 03/03/2023 Medical Rec #:  [id number]       Height:  64.0 in Accession #:    [id number]      Weight:       138.0 lb Date of Birth:  08/13/1951        BSA:          1.671 m Patient Age:    71 years        BP:           100/57 mmHg Patient Gender: F               HR:           52 bpm. Exam Location:  Inpatient Procedure: 2D Echo, Cardiac Doppler and Color Doppler Indications:    Other cardiac sounds R01.2  History:        Patient has prior history of Echocardiogram examinations, most                 recent 08/16/2020. Cancer, Arrythmias:Bradycardia,                 Signs/Symptoms:Murmur; Risk Factors:Hypertension.  Sonographer:    Aron Baba Referring Phys: [id number] RIPUDEEP K RAI  Sonographer Comments: Image acquisition challenging due to patient body habitus and Image acquisition challenging due to respiratory motion. IMPRESSIONS  1. Descending thoracic aorta dilated (4.1 cm); suggest CTA to better assess.  2. Left ventricular ejection fraction, by estimation, is 60 to 65%. The left ventricle has normal function. The left ventricle has no  regional wall motion abnormalities. Left ventricular diastolic parameters are consistent with Grade I diastolic dysfunction (impaired relaxation). Elevated left atrial pressure.  3. Right ventricular systolic function is normal. The right ventricular size is mildly enlarged. There is mildly elevated pulmonary artery systolic pressure.  4. Left atrial size was moderately dilated.  5. The mitral valve is normal in structure. Trivial mitral valve regurgitation. No evidence of mitral stenosis. Moderate mitral annular calcification.  6. The aortic valve is tricuspid. Aortic valve regurgitation is not visualized. Aortic valve sclerosis is present, with no evidence of aortic valve stenosis.  7. The inferior vena cava is dilated in size with <50% respiratory variability, suggesting right atrial pressure of 15 mmHg. FINDINGS  Left Ventricle: Left ventricular ejection fraction, by estimation, is 60 to 65%. The left ventricle has normal function. The left ventricle has no regional wall motion abnormalities. The left ventricular internal cavity size was normal in size. There is  no left ventricular hypertrophy. Left ventricular diastolic parameters are consistent with Grade I diastolic dysfunction (impaired relaxation). Elevated left atrial pressure. Right Ventricle: The right ventricular size is mildly enlarged. Right ventricular systolic function is normal. There is mildly elevated pulmonary artery systolic pressure. The tricuspid regurgitant velocity is 2.57 m/s, and with an assumed right atrial pressure of 15 mmHg, the estimated right ventricular systolic pressure is 41.4 mmHg. Left Atrium: Left atrial size was moderately dilated. Right Atrium: Right atrial size was normal in size. Pericardium: There is no evidence of pericardial effusion. Mitral Valve: The mitral valve is normal in structure. Moderate mitral annular calcification. Trivial mitral valve regurgitation. No evidence of mitral valve stenosis. Tricuspid Valve:  The tricuspid valve is normal in structure. Tricuspid valve regurgitation is mild . No evidence of tricuspid stenosis. Aortic Valve: The aortic valve is tricuspid. Aortic valve regurgitation is not visualized. Aortic valve sclerosis is present, with no evidence of aortic valve stenosis. Pulmonic Valve: The pulmonic valve was normal in structure. Pulmonic valve regurgitation is trivial. No evidence of pulmonic stenosis. Aorta: The aortic root is normal  in size and structure. Venous: The inferior vena cava is dilated in size with less than 50% respiratory variability, suggesting right atrial pressure of 15 mmHg. IAS/Shunts: No atrial level shunt detected by color flow Doppler. Additional Comments: Descending thoracic aorta dilated (4.1 cm); suggest CTA to better assess.  LEFT VENTRICLE PLAX 2D LVIDd:         4.90 cm   Diastology LVIDs:         3.40 cm   LV e' medial:    6.53 cm/s LV PW:         1.30 cm   LV E/e' medial:  18.4 LV IVS:        0.60 cm   LV e' lateral:   7.18 cm/s LVOT diam:     1.50 cm   LV E/e' lateral: 16.7 LV SV:         64 LV SV Index:   38 LVOT Area:     1.77 cm  RIGHT VENTRICLE RV S prime:     10.90 cm/s TAPSE (M-mode): 2.4 cm LEFT ATRIUM             Index        RIGHT ATRIUM           Index LA diam:        3.80 cm 2.27 cm/m   RA Area:     15.50 cm LA Vol (A2C):   74.8 ml 44.76 ml/m  RA Volume:   37.00 ml  22.14 ml/m LA Vol (A4C):   77.4 ml 46.32 ml/m LA Biplane Vol: 82.7 ml 49.49 ml/m  AORTIC VALVE             PULMONIC VALVE LVOT Vmax:   140.00 cm/s PR End Diast Vel: 1.05 msec LVOT Vmean:  92.700 cm/s LVOT VTI:    0.361 m  AORTA Ao Root diam: 3.10 cm Ao Asc diam:  2.90 cm MITRAL VALVE                TRICUSPID VALVE MV Area (PHT): 2.42 cm     TR Peak grad:   26.4 mmHg MV Decel Time: 313 msec     TR Vmax:        257.00 cm/s MV E velocity: 120.00 cm/s MV A velocity: 126.00 cm/s  SHUNTS MV E/A ratio:  0.95         Systemic VTI:  0.36 m                             Systemic Diam: 1.50 cm Olga Millers MD Electronically signed by Olga Millers MD Signature Date/Time: 03/03/2023/3:21:44 PM    Final     Scheduled Meds:  (feeding supplement) PROSource Plus  30 mL Oral BID BM   Chlorhexidine Gluconate Cloth  6 each Topical Daily   enoxaparin (LOVENOX) injection  40 mg Subcutaneous Q24H   feeding supplement  237 mL Oral TID BM   lactulose  30 g Oral Q6H   levothyroxine  75 mcg Oral Q0600   metoCLOPramide (REGLAN) injection  10 mg Intravenous Q6H   midodrine  10 mg Oral BID WC   mouth rinse  15 mL Mouth Rinse 4 times per day   rifaximin  550 mg Oral BID   zinc sulfate  220 mg Oral BID   Continuous Infusions:     LOS: 8 days   Hughie Closs, MD [facility]  03/05/2023, 1:23 PM   *  Please note that this is a verbal dictation therefore any spelling or grammatical errors are due to the "[place]" system interpretation.  Please page via [place] and do not message via secure chat for urgent patient care matters. Secure chat can be used for non urgent patient care matters.  How to contact the [facility] Attending or Consulting provider [place] or covering provider during after hours [place], for this patient?  Check the care team in [facility] and look for a) attending/consulting [facility] provider listed and b) the [facility] team listed. Page or secure chat [phone]. Log into [email or web address] and use 's universal password to access. If you do not have the password, please contact the hospital operator. Locate the [facility] provider you are looking for under [facility] and page to a number that you can be directly reached. If you still have difficulty reaching the provider, please page the [facility] (Director on Call) for the Hospitalists listed on amion for assistance.

## 2023-03-05 NOTE — Progress Notes (Signed)
Dr. Jacques Navy has been reviewing chart daily and is signing off today, 4 week follow-up made per request and added to AVS. Please call with questions.

## 2023-03-05 NOTE — Progress Notes (Signed)
Mobility Specialist Progress Note:   03/05/23 0930  Mobility  Activity Transferred from bed to chair  Level of Assistance Minimal assist, patient does 75% or more  Assistive Device None  Distance Ambulated (ft) 3 ft  Activity Response Tolerated well  Mobility Referral Yes  $Mobility charge 1 Mobility   Pt eager to transfer to chair. Required minA to stand with no AD use. Able to take steps to chair. Pt left with all needs met, alarm on.   Addison Lank Mobility Specialist Please contact via SecureChat or  Rehab office at [phone]

## 2023-03-05 NOTE — Significant Event (Signed)
Hypoglycemic Event  CBG: 63  Treatment: 8 oz juice/soda  Symptoms: None  Follow-up CBG: Time:1200 CBG Result:73  Possible Reasons for Event: Unknown  Comments/MD notified:N/A Protocol followed    Arthor Captain

## 2023-03-05 NOTE — TOC Progression Note (Signed)
Transition of Care [facility]) - Progression Note    Patient Details  Name: [name] MRN: [id number] Date of Birth: 1951-06-27  Transition of [facility]) CM/SW Contact  Leone Haven, RN Phone Number: 03/05/2023, 6:46 PM  Clinical Narrative:    CIR following , they have started the auth process. TOC following.   Expected Discharge Plan: IP Rehab Facility Barriers to Discharge: Continued Medical Work up  Expected Discharge Plan and Services In-house Referral: NA Discharge Planning Services: CM Consult Post Acute Care Choice:  (unknown) Living arrangements for the past 2 months: Single Family Home                 DME Arranged: N/A DME Agency: NA       HH Arranged: NA HH Agency: NA         Social Determinants of Health (SDOH) Interventions SDOH Screenings   Food Insecurity: No Food Insecurity (02/26/2023)  Housing: Low Risk  (02/26/2023)  Transportation Needs: No Transportation Needs (02/26/2023)  Utilities: Not At Risk (02/26/2023)  Alcohol Screen: Low Risk  (04/01/2022)  Depression (PHQ2-9): Low Risk  (02/24/2023)  Financial Resource Strain: Low Risk  (04/01/2022)  Physical Activity: Insufficiently Active (04/01/2022)  Social Connections: Moderately Integrated (04/01/2022)  Stress: No Stress Concern Present (04/01/2022)  Tobacco Use: Low Risk  (02/25/2023)    Readmission Risk Interventions    03/02/2023    3:19 PM  Readmission Risk Prevention Plan  Transportation Screening Complete  PCP or Specialist Appt within 3-5 Days Complete  HRI or Home Care Consult Complete  Social Work Consult for Recovery Care Planning/Counseling Complete  Medication Review Oceanographer) Complete

## 2023-03-06 DIAGNOSIS — K7682 Hepatic encephalopathy: Secondary | ICD-10-CM | POA: Diagnosis not present

## 2023-03-06 LAB — GLUCOSE, CAPILLARY
Glucose-Capillary: 123 mg/dL — ABNORMAL HIGH (ref 70–99)
Glucose-Capillary: 126 mg/dL — ABNORMAL HIGH (ref 70–99)
Glucose-Capillary: 60 mg/dL — ABNORMAL LOW (ref 70–99)
Glucose-Capillary: 64 mg/dL — ABNORMAL LOW (ref 70–99)
Glucose-Capillary: 74 mg/dL (ref 70–99)
Glucose-Capillary: 77 mg/dL (ref 70–99)
Glucose-Capillary: 87 mg/dL (ref 70–99)
Glucose-Capillary: 90 mg/dL (ref 70–99)
Glucose-Capillary: 90 mg/dL (ref 70–99)
Glucose-Capillary: 92 mg/dL (ref 70–99)

## 2023-03-06 MED ORDER — ATROPINE SULFATE 1 MG/10ML IJ SOSY: 1.0000 mg | PREFILLED_SYRINGE | Freq: Once | INTRAMUSCULAR | Status: DC | PRN

## 2023-03-06 MED ORDER — MIDODRINE HCL 5 MG PO TABS
15.0000 mg | ORAL_TABLET | Freq: Three times a day (TID) | ORAL | Status: DC
  Administered 2023-03-07 – 2023-03-11 (×14): 15 mg via ORAL
  Filled 2023-03-06 (×14): qty 3

## 2023-03-06 MED ORDER — METOCLOPRAMIDE HCL 5 MG/ML IJ SOLN: 10.0000 mg | Freq: Four times a day (QID) | INTRAMUSCULAR | Status: DC | PRN

## 2023-03-06 MED ORDER — MIDODRINE HCL 5 MG PO TABS
5.0000 mg | ORAL_TABLET | Freq: Once | ORAL | Status: AC
  Administered 2023-03-06: 5 mg via ORAL
  Filled 2023-03-06: qty 1

## 2023-03-06 NOTE — Progress Notes (Signed)
Overnight event  Patient bradycardic with heart rate in the 40s and blood pressure soft with systolic in the 90s.  EKG showing sinus rhythm with first-degree AV block.  Per review of vital signs, patient's heart rate has been in the 50s during the day as well and blood pressure soft in the setting of liver cirrhosis.  She is on midodrine 10 mg BID.  I spoke to cardiologist Dr. Welton Flakes who recommends increasing the dose of midodrine to 15 mg TID and giving atropine if patient hypotensive with SBP <90.  Will continue to monitor very closely.

## 2023-03-06 NOTE — Plan of Care (Signed)

## 2023-03-06 NOTE — Progress Notes (Signed)
PROGRESS NOTE    [name]  ZOX:[id number] DOB: 1951-04-18 DOA: 02/25/2023 PCP: Eden Emms, NP   Brief Narrative:  72 year old female with history of colon cancer thousand +3, status post partial colectomy and chemotherapy, history of liver cirrhosis felt secondary to previous chemotherapy with oxaliplatin, s/p TIPS procedure at [facility] in 2012, hepatic encephalopathy, came to the hospital with confusion for past 2 days. As per patient has been patient broke her left wrist in March at that time she was started on opioid-based analgesics which caused constipation.  Patient's dose of lactulose was increased from 45 cc 3 times a day to 60 cc 3 times a day. Despite this patient was not having bowel movements, she has been incontinent of urine and stools. Patient has been more altered with nausea vomiting for past 2 days.  Missed dose of lactulose.  Was found to have temperature of 94 F at home. In the ED she was found to be lethargic and hypothermic.  Bair hugger was applied and hypothermia resolved. UA was positive for pyuria, ammonia was elevated at 163.    Assessment & Plan:   Principal Problem:   Acute hepatic encephalopathy Active Problems:   Decompensated liver cirrhosis with portal HTN and gastric varices   AKI (acute kidney injury)   Hypothyroidism   Hypertension   Hx of colon cancer, stage III   Bradycardia   Anemia   Acute cystitis with hematuria   Protein-calorie malnutrition, severe  Acute hepatic encephalopathy -Ammonia level elevated at 163 on admission but it has now improved to 40 today.  She is fully alert and oriented.  Continue lactulose every 6 hours.   Hypoglycemia: Patient was doing well until this morning when she has not started to have recurrent hypoglycemia.  Reportedly, she is eating around 75% of her meals as well.  She is fully alert and oriented as well.  Will monitor for few hours and if remains persistently hypoglycemic, will start on  dextrose fluids.   Hypothermia: Secondary to acute hepatic encephalopathy.  Now resolved.   Acute kidney injury: Resolved.    Hypotension: Blood pressure is improving.  Continue midodrine.   Liver cirrhosis with portal hypertension s/p TIPS procedure in 2012 -Status post TIPS procedure in 2012. Developed liver cirrhosis after chemotherapy with oxaliplatin -MELD score 15, 6% estimated 37-month mortality    UTI ruled out: Antibiotics discontinued.  Hypothyroidism: Continue Synthroid.  Pancytopenia: Likely chemotherapy-induced.  Stable.  SVT episodes with bradycardia, new : While at [facility] long, patient had episodes of bradycardia with pauses, HR lowest 34, given atropine 1mg  x1.  After discussing with cardiology as well as PCCM, cardiology recommended transferring to ICU at [facility] for possible pacing.  However since arrival to [facility], patient's rhythm has mainly been normal sinus rhythm so cardiology has now recommended supportive care and has signed off.  Patient is asymptomatic.   Hx of colon cancer, stage III - status post partial colectomy and chemotherapy, history of liver cirrhosis felt secondary to previous chemotherapy with oxaliplatin, s/p TIPS procedure   Severe protein calorie malnutrition: RD on board.   Disposition: Patient has been seen by PT OT and they recommended CIR.  Patient is being followed by CIR for potential admission.  DVT prophylaxis: enoxaparin (LOVENOX) injection 40 mg Start: 03/02/23 2000 SCDs Start: 02/26/23 0035   Code Status: Full Code  Family Communication: Husband present at bedside.  Plan of care discussed with patient in length and he/she verbalized understanding and agreed  with it.  Status is: Inpatient Remains inpatient appropriate because: Improving.  CIR following for potential admission.  Insurance authorization started and pending.  Patient is now medically stable.   Estimated body mass index is 24.52 kg/m as calculated from the  following:   Height as of this encounter:  (1.626 m).   Weight as of this encounter: 64.8 kg.    Nutritional Assessment: Body mass index is 24.52 kg/m.Marland Kitchen Seen by dietician.  I agree with the assessment and plan as outlined below: Nutrition Status: Nutrition Problem: Severe Malnutrition Etiology: acute illness Signs/Symptoms: moderate fat depletion, moderate muscle depletion, energy intake < or equal to 50% for > or equal to 5 days, percent weight loss (14.6% in 3 weeks) Percent weight loss: 14.6 % (in 3 weeks) Interventions: Refer to RD note for recommendations  . Skin Assessment: I have examined the patient's skin and I agree with the wound assessment as performed by the wound care RN as outlined below:    Consultants:  GI  Procedures:  None  Antimicrobials:  Anti-infectives (From admission, onward)    Start     Dose/Rate Route Frequency Ordered Stop   03/02/23 1200  rifaximin (XIFAXAN) tablet 550 mg        550 mg Oral 2 times daily 03/02/23 1039     02/26/23 2200  cefTRIAXone (ROCEPHIN) 2 g in sodium chloride 0.9 % 100 mL IVPB  Status:  Discontinued        2 g 200 mL/hr over 30 Minutes Intravenous Every 24 hours 02/26/23 0034 02/27/23 0953   02/25/23 2200  cefTRIAXone (ROCEPHIN) 1 g in sodium chloride 0.9 % 100 mL IVPB  Status:  Discontinued        1 g 200 mL/hr over 30 Minutes Intravenous Every 24 hours 02/25/23 2118 02/26/23 0034         Subjective: Patient seen and examined earlier.  She has no complaints.  She was fully alert and oriented.  Objective: Vitals:   03/06/23 0048 03/06/23 0323 03/06/23 0853 03/06/23 1133  BP: (!) 108/56 (!) 107/52 (!) 89/46 (!) 109/40  Pulse: (!) 51 (!) 51 (!) 59 (!) 50  Resp: Temp:  (!) 97.4 F (36.3 C) 97.6 F (36.4 C) 97.6 F (36.4 C)  TempSrc: Axillary Oral Oral Oral  SpO2: 100% 100% 100% 99%  Weight:  64.8 kg    Height:        Intake/Output Summary (Last 24 hours) at 03/06/2023 1248 Last data filed  at 03/06/2023 1100 Gross per 24 hour  Intake 180 ml  Output 400 ml  Net -220 ml    Filed Weights   03/03/23 2105 03/04/23 0419 03/06/23 0323  Weight: 66.1 kg 67 kg 64.8 kg    Examination:  General exam: Appears calm and comfortable  Respiratory system: Clear to auscultation. Respiratory effort normal. Cardiovascular system: S1 & S2 heard, RRR. No JVD, murmurs, rubs, gallops or clicks. No pedal edema. Gastrointestinal system: Abdomen is nondistended, soft and nontender. No organomegaly or masses felt. Normal bowel sounds heard. Central nervous system: Alert and oriented. No focal neurological deficits. Extremities: Symmetric 5 x 5 power. Skin: No rashes, lesions or ulcers.  Psychiatry: Judgement and insight appear normal. Mood & affect appropriate.     Data Reviewed: I have personally reviewed following labs and imaging studies  CBC: Recent Labs  Lab 02/28/23 0305 03/01/23 0309 03/02/23 0438 03/03/23 0139  WBC 2.3* 6.4 4.4 3.3*  NEUTROABS 1.3* 5.1  --   --  HGB 9.4* 10.2* 9.8* 10.2*  HCT 29.7* 31.8* 30.5* 29.8*  MCV 110.4* 109.7* 109.7* 104.9*  PLT 78* 106* 105* 127*    Basic Metabolic Panel: Recent Labs  Lab 03/01/23 0309 03/01/23 1259 03/02/23 0438 03/02/23 1501 03/02/23 1709 03/03/23 0139 03/03/23 1635 03/04/23 0541 03/04/23 1739 03/05/23 0432  NA 145  --  143 141  --  138  --  138  --   --   K 3.7  --  3.7 3.9  --  3.7  --  4.0  --   --   CL 119*  --  115* 115*  --  112*  --  109  --   --   CO2 19*  --  21* 22  --  21*  --  20*  --   --   GLUCOSE 99  --  89 91  --  93  --  98  --   --   BUN 10  --  13 16  --  14  --  20  --   --   CREATININE 0.63  --  0.69 0.78  --  0.72  --  0.72  --   --   CALCIUM 9.3  --  9.5 9.1  --  9.1  --  9.1  --   --   MG  --    < > 1.9 1.8   < > 2.4 1.8 1.8 1.9 1.8  PHOS  --    < > 2.8  --    < > 2.9 3.0 3.0 3.3 3.0   < > = values in this interval not displayed.    GFR: Estimated Creatinine Clearance: 55.7 mL/min (by  C-G formula based on SCr of 0.72 mg/dL). Liver Function Tests: Recent Labs  Lab 02/28/23 0305 03/01/23 0309 03/02/23 0438 03/03/23 0139 03/04/23 0541  AST 41 40 39 40 44*  ALT ALKPHOS 144* 157* 170* 191* 201*  BILITOT 2.2* 2.9* 2.6* 2.8* 2.5*  PROT 6.9 6.5 6.3* 6.3* 6.2*  ALBUMIN 4.5 4.0 3.6 3.3* 3.2*    No results for input(s): "LIPASE", "AMYLASE" in the last 168 hours. Recent Labs  Lab 02/28/23 0305 03/01/23 0309 03/02/23 0438 03/03/23 0139 03/04/23 0541  AMMONIA 66* 50* 45* 76* 44*    Coagulation Profile: Recent Labs  Lab 02/28/23 0305 03/01/23 0309 03/02/23 0438 03/03/23 0139  INR 2.1* 2.1* 2.1* 1.8*    Cardiac Enzymes: No results for input(s): "CKTOTAL", "CKMB", "CKMBINDEX", "TROPONINI" in the last 168 hours. BNP (last 3 results) No results for input(s): "PROBNP" in the last 8760 hours. HbA1C: No results for input(s): "HGBA1C" in the last 72 hours. CBG: Recent Labs  Lab 03/06/23 0329 03/06/23 0748 03/06/23 0846 03/06/23 1131 03/06/23 1158  GLUCAP 74 64* 126* 60* 77    Lipid Profile: No results for input(s): "CHOL", "HDL", "LDLCALC", "TRIG", "CHOLHDL", "LDLDIRECT" in the last 72 hours. Thyroid Function Tests: No results for input(s): "TSH", "T4TOTAL", "FREET4", "T3FREE", "THYROIDAB" in the last 72 hours.  Anemia Panel: No results for input(s): "VITAMINB12", "FOLATE", "FERRITIN", "TIBC", "IRON", "RETICCTPCT" in the last 72 hours. Sepsis Labs: No results for input(s): "PROCALCITON", "LATICACIDVEN" in the last 168 hours.   Recent Results (from the past 240 hour(s))  Urine Culture     Status: None   Collection Time: 02/25/23  8:06 PM   Specimen: Urine, Clean Catch  Result Value Ref Range Status   Specimen Description   Final    URINE,  CLEAN CATCH Performed at [facility], [address], [place], [place] [address]    Special Requests   Final    NONE Performed at [facility], [address] [place], [place] [address]    Culture   Final    NO GROWTH Performed at [facility], [address], [place], [place] [address]    Report Status 02/26/2023 FINAL  Final  MRSA Next Gen by PCR, Nasal     Status: None   Collection Time: 02/26/23 12:16 AM   Specimen: Nasal Mucosa; Nasal Swab  Result Value Ref Range Status   MRSA by PCR Next Gen NOT DETECTED NOT DETECTED Final    Comment: (NOTE) The GeneXpert MRSA Assay (FDA approved for NASAL specimens only), is one component of a comprehensive MRSA colonization surveillance program. It is not intended to diagnose MRSA infection nor to guide or monitor treatment for MRSA infections. Test performance is not FDA approved in patients less than 66 years old. Performed at [facility], [address], [place], [place] [address]   Culture, blood (Routine X 2) w Reflex to ID Panel     Status: None   Collection Time: 02/26/23  2:14 AM   Specimen: BLOOD  Result Value Ref Range Status   Specimen Description   Final    BLOOD LEFT ANTECUBITAL Performed at [facility], [address] [place], [place] [address]    Special Requests   Final    BOTTLES DRAWN AEROBIC AND ANAEROBIC Blood Culture adequate volume Performed at [facility], [address], [place], [place] [address]    Culture   Final    NO GROWTH 5 DAYS Performed at [facility], [address], [place], [place] [address]    Report Status 03/03/2023 FINAL  Final  Culture, blood (Routine X 2) w Reflex to ID Panel     Status: None   Collection Time: 02/26/23  2:14 AM   Specimen: BLOOD  Result Value Ref Range Status   Specimen Description   Final    BLOOD LEFT ANTECUBITAL Performed at [facility], [address], [place], [place] [address]    Special Requests   Final    BOTTLES DRAWN AEROBIC AND ANAEROBIC Blood Culture adequate volume Performed at [facility],  [address], [place], [place] [address]    Culture   Final    NO GROWTH 5 DAYS Performed at [facility], [address], [place], [place] [address]    Report Status 03/03/2023 FINAL  Final  MRSA Next Gen by PCR, Nasal     Status: None   Collection Time: 03/02/23  7:40 PM   Specimen: Nasal Mucosa; Nasal Swab  Result Value Ref Range Status   MRSA by PCR Next Gen NOT DETECTED NOT DETECTED Final    Comment: (NOTE) The GeneXpert MRSA Assay (FDA approved for NASAL specimens only), is one component of a comprehensive MRSA colonization surveillance program. It is not intended to diagnose MRSA infection nor to guide or monitor treatment for MRSA infections. Test performance is not FDA approved in patients less than 98 years old. Performed at [facility], [address], [place], [place] [address]      Radiology Studies: No results found.  Scheduled Meds:  (feeding supplement) PROSource Plus  30 mL Oral BID BM   Chlorhexidine Gluconate Cloth  6 each Topical Daily  copper  6 mg Oral Daily   enoxaparin (LOVENOX) injection  40 mg Subcutaneous Q24H   feeding supplement  237 mL Oral TID BM   lactulose  30 g Oral Q6H   levothyroxine  75 mcg Oral Q0600   midodrine  10 mg Oral BID WC   mouth rinse  15 mL Mouth Rinse 4 times per day   rifaximin  550 mg Oral BID   Continuous Infusions:     LOS: 9 days   Hughie Closs, MD [facility]  03/06/2023, 12:48 PM   *Please note that this is a verbal dictation therefore any spelling or grammatical errors are due to the "Dragon Medical One" system interpretation.  Please page via Amion and do not message via secure chat for urgent patient care matters. Secure chat can be used for non urgent patient care matters.  How to contact the [facility] Attending or Consulting provider 7A - 7P or covering provider during after hours 7P -7A, for this patient?  Check the care team in [facility] and look for a) attending/consulting TRH provider  listed and b) the [facility] team listed. Page or secure chat 7A-7P. Log into [email or web address] and use [place] universal password to access. If you do not have the password, please contact the hospital operator. Locate the [facility] provider you are looking for under [facility] and page to a number that you can be directly reached. If you still have difficulty reaching the provider, please page the [facility] (Director on Call) for the Hospitalists listed on amion for assistance.

## 2023-03-06 NOTE — Progress Notes (Signed)
Notified Dr. Jacqulyn Bath of patient's CBG orders from this shift. No further orders received.

## 2023-03-06 NOTE — Progress Notes (Signed)
Mobility Specialist Progress Note    03/06/23 1117  Mobility  Activity Ambulated with assistance in hallway  Level of Assistance +2 (takes two people) (safety)  Assistive Device Other (Comment) (HHA)  Distance Ambulated (ft) 220 ft  LUE Weight Bearing NWB  Activity Response Tolerated well  Mobility Referral Yes  $Mobility charge 1 Mobility   Pre-Mobility: 52 HR During Mobility: 74 HR Post-Mobility: 61 HR  Pt received in chair and agreeable. MinA+2 during. C/o some fatigue and returned to chair with call bell in reach and family present.   [place] Mobility Specialist  Please Neurosurgeon or Rehab Office at [phone]

## 2023-03-06 NOTE — Progress Notes (Signed)
Notified Dr. Jacqulyn Bath that patient's husband has many questions and is requesting to talk to him tomorrow. MD states he will talk to husband during morning rounds. Patient and husband updated and are appreciative.

## 2023-03-07 DIAGNOSIS — K7682 Hepatic encephalopathy: Secondary | ICD-10-CM | POA: Diagnosis not present

## 2023-03-07 LAB — COMPREHENSIVE METABOLIC PANEL
ALT: 45 U/L — ABNORMAL HIGH (ref 0–44)
AST: 69 U/L — ABNORMAL HIGH (ref 15–41)
Albumin: 2.8 g/dL — ABNORMAL LOW (ref 3.5–5.0)
Alkaline Phosphatase: 218 U/L — ABNORMAL HIGH (ref 38–126)
Anion gap: 8 (ref 5–15)
BUN: 15 mg/dL (ref 8–23)
CO2: 21 mmol/L — ABNORMAL LOW (ref 22–32)
Calcium: 9 mg/dL (ref 8.9–10.3)
Chloride: 107 mmol/L (ref 98–111)
Creatinine, Ser: 0.67 mg/dL (ref 0.44–1.00)
GFR, Estimated: >60 mL/min (ref >60)
Glucose, Bld: 117 mg/dL — ABNORMAL HIGH (ref 70–99)
Potassium: 4.3 mmol/L (ref 3.5–5.1)
Sodium: 136 mmol/L (ref 135–145)
Total Bilirubin: 1.5 mg/dL — ABNORMAL HIGH (ref 0.3–1.2)
Total Protein: 5.8 g/dL — ABNORMAL LOW (ref 6.5–8.1)

## 2023-03-07 LAB — GLUCOSE, CAPILLARY
Glucose-Capillary: 120 mg/dL — ABNORMAL HIGH (ref 70–99)
Glucose-Capillary: 64 mg/dL — ABNORMAL LOW (ref 70–99)
Glucose-Capillary: 66 mg/dL — ABNORMAL LOW (ref 70–99)
Glucose-Capillary: 71 mg/dL (ref 70–99)
Glucose-Capillary: 77 mg/dL (ref 70–99)
Glucose-Capillary: 89 mg/dL (ref 70–99)
Glucose-Capillary: 89 mg/dL (ref 70–99)
Glucose-Capillary: 89 mg/dL (ref 70–99)

## 2023-03-07 LAB — CBC WITH DIFFERENTIAL/PLATELET
Abs Immature Granulocytes: 0.01 10*3/uL (ref 0.00–0.07)
Basophils Absolute: 0.1 10*3/uL (ref 0.0–0.1)
Basophils Relative: 2 %
Eosinophils Absolute: 0.2 10*3/uL (ref 0.0–0.5)
Eosinophils Relative: 6 %
HCT: 28.8 % — ABNORMAL LOW (ref 36.0–46.0)
Hemoglobin: 9.7 g/dL — ABNORMAL LOW (ref 12.0–15.0)
Immature Granulocytes: 0 %
Lymphocytes Relative: 37 %
Lymphs Abs: 1.3 10*3/uL (ref 0.7–4.0)
MCH: 35.7 pg — ABNORMAL HIGH (ref 26.0–34.0)
MCHC: 33.7 g/dL (ref 30.0–36.0)
MCV: 105.9 fL — ABNORMAL HIGH (ref 80.0–100.0)
Monocytes Absolute: 0.4 10*3/uL (ref 0.1–1.0)
Monocytes Relative: 10 %
Neutro Abs: 1.6 10*3/uL — ABNORMAL LOW (ref 1.7–7.7)
Neutrophils Relative %: 45 %
Platelets: 203 10*3/uL (ref 150–400)
RBC: 2.72 MIL/uL — ABNORMAL LOW (ref 3.87–5.11)
RDW: 17.3 % — ABNORMAL HIGH (ref 11.5–15.5)
WBC: 3.4 10*3/uL — ABNORMAL LOW (ref 4.0–10.5)
nRBC: 0 % (ref 0.0–0.2)

## 2023-03-07 MED ORDER — DEXTROSE-NACL 5-0.9 % IV SOLN: INTRAVENOUS | Status: AC

## 2023-03-07 MED ORDER — LACTULOSE 10 GM/15ML PO SOLN
30.0000 g | Freq: Three times a day (TID) | ORAL | Status: DC
  Administered 2023-03-07 – 2023-03-13 (×18): 30 g via ORAL
  Filled 2023-03-07 (×18): qty 45

## 2023-03-07 NOTE — Progress Notes (Signed)
Hypoglycemic Event  CBG: 66  Treatment: 4 oz juice/soda  Symptoms: None  Follow-up CBG: Time: 0349 CBG Result: 77  Possible Reasons for Event: Inadequate meal intake  Comments/MD notified: Rathore notified     Tracie Harrier

## 2023-03-07 NOTE — Progress Notes (Signed)
Pt and husband has concerns about lactulose dose being ordered to frequently. States that pt is constantly going to bathroom and mentation is back at baseline .  Lactulose is ordered 30mg  Q6hr, next dose at midnight and then 0600, pt would like to skip doses and also asking for something to slow down the diarrhea , provider Rathore notified.

## 2023-03-07 NOTE — Progress Notes (Signed)
Notified MD of patient's blood sugars and intake from 7am until 12pm. MD responded that no further interventions are needed unless CBG drops below 60 and/or patient becomes symptomatic. Patient and family updated and will call RN if any symptoms are noticed.

## 2023-03-07 NOTE — Plan of Care (Signed)

## 2023-03-07 NOTE — Progress Notes (Signed)
PROGRESS NOTE    [name]  DJT:[id number] DOB: [birth year] DOA: 02/25/2023 PCP: Eden Emms, NP   Brief Narrative:  72 year old female with history of colon cancer thousand +3, status post partial colectomy and chemotherapy, history of liver cirrhosis felt secondary to previous chemotherapy with oxaliplatin, s/p TIPS procedure at [facility] in 2012, hepatic encephalopathy, came to the hospital with confusion for past 2 days. As per patient has been patient broke her left wrist in March at that time she was started on opioid-based analgesics which caused constipation.  Patient's dose of lactulose was increased from 45 cc 3 times a day to 60 cc 3 times a day. Despite this patient was not having bowel movements, she has been incontinent of urine and stools. Patient has been more altered with nausea vomiting for past 2 days.  Missed dose of lactulose.  Was found to have temperature of 94 F at home. In the ED she was found to be lethargic and hypothermic.  Bair hugger was applied and hypothermia resolved. UA was positive for pyuria, ammonia was elevated at 163.    Assessment & Plan:   Principal Problem:   Acute hepatic encephalopathy Active Problems:   Decompensated liver cirrhosis with portal HTN and gastric varices   AKI (acute kidney injury)   Hypothyroidism   Hypertension   Hx of colon cancer, stage III   Bradycardia   Anemia   Acute cystitis with hematuria   Protein-calorie malnutrition, severe  Acute hepatic encephalopathy -Ammonia level elevated at 163 on admission which improved with lactulose.  Husband was asking to repeat ammonia level.  I informed him that since patient has remained alert and oriented since last 3 days, there is no indication for ammonia.  We will repeated if got for but, she were to have acute encephalopathy symptoms again.  He understands the rationale behind that.  He tells me that patient has been having bowel movements every hour.  Patient says  that she is having 3-4 bowel moods a day.  Per his request, I am reducing the lactulose to every 8 hours instead of every 6 hours.   Hypoglycemia: Improved.  She was encouraged to eat more.   Hypothermia: Secondary to acute hepatic encephalopathy.  Now resolved.   Acute kidney injury: Resolved.    Hypotension: She was noted to have low blood pressure last night so she was started on some IV fluids for 12 hours by night hospitalist.  Night hospitalist also discussed with cardiology who recommended increasing midodrine to 15 mg 3 times daily.   Liver cirrhosis with portal hypertension s/p TIPS procedure in 2012 -Status post TIPS procedure in 2012. Developed liver cirrhosis after chemotherapy with oxaliplatin -MELD score 15, 6% estimated 72-month mortality    UTI ruled out: Antibiotics discontinued.  Hypothyroidism: Continue Synthroid.  Pancytopenia: Likely chemotherapy-induced.  Stable.  SVT episodes with bradycardia, new : While at [facility] long, patient had episodes of bradycardia with pauses, HR lowest 34, given atropine 1mg  x1.  After discussing with cardiology as well as PCCM, cardiology recommended transferring to ICU at [facility] for possible pacing.  However since arrival to [facility], patient's rhythm has mainly been normal sinus rhythm so cardiology has now recommended supportive care and has signed off.  Patient is asymptomatic.  She is bradycardic.   Hx of colon cancer, stage III - status post partial colectomy and chemotherapy, history of liver cirrhosis felt secondary to previous chemotherapy with oxaliplatin, s/p TIPS procedure   Severe protein  calorie malnutrition: RD on board.   Disposition: Patient has been seen by PT OT and they recommended CIR.  Patient is being followed by CIR for potential admission.  Medically stable for discharge.  DVT prophylaxis: enoxaparin (LOVENOX) injection 40 mg Start: 03/02/23 2000 SCDs Start: 02/26/23 0035   Code Status: Full Code  Family  Communication: Husband present at bedside.  Plan of care discussed with patient in length and he/she verbalized understanding and agreed with it.  Status is: Inpatient Remains inpatient appropriate because: Improving.  CIR following for potential admission.  Insurance authorization started and pending.  Patient is now medically stable.   Estimated body mass index is 24.82 kg/m as calculated from the following:   Height as of this encounter:  (1.626 m).   Weight as of this encounter: 65.6 kg.    Nutritional Assessment: Body mass index is 24.82 kg/m.Marland Kitchen Seen by dietician.  I agree with the assessment and plan as outlined below: Nutrition Status: Nutrition Problem: Severe Malnutrition Etiology: acute illness Signs/Symptoms: moderate fat depletion, moderate muscle depletion, energy intake < or equal to 50% for > or equal to 5 days, percent weight loss (14.6% in 3 weeks) Percent weight loss: 14.6 % (in 3 weeks) Interventions: Refer to RD note for recommendations  . Skin Assessment: I have examined the patient's skin and I agree with the wound assessment as performed by the wound care RN as outlined below:    Consultants:  GI  Procedures:  None  Antimicrobials:  Anti-infectives (From admission, onward)    Start     Dose/Rate Route Frequency Ordered Stop   03/02/23 1200  rifaximin (XIFAXAN) tablet 550 mg        550 mg Oral 2 times daily 03/02/23 1039     02/26/23 2200  cefTRIAXone (ROCEPHIN) 2 g in sodium chloride 0.9 % 100 mL IVPB  Status:  Discontinued        2 g 200 mL/hr over 30 Minutes Intravenous Every 24 hours 02/26/23 0034 02/27/23 0953   02/25/23 2200  cefTRIAXone (ROCEPHIN) 1 g in sodium chloride 0.9 % 100 mL IVPB  Status:  Discontinued        1 g 200 mL/hr over 30 Minutes Intravenous Every 24 hours 02/25/23 2118 02/26/23 0034         Subjective: Patient seen and examined.  She has no complaints.  She is fully alert and oriented.  Objective: Vitals:    03/07/23 0640 03/07/23 0728 03/07/23 1000 03/07/23 1112  BP: (!) 102/42 (!) 155/92 (!) 93/42 (!) 105/41  Pulse: (!) 53 (!) 52  (!) 52  Resp: Temp:  97.8 F (36.6 C)  97.8 F (36.6 C)  TempSrc:  Oral  Oral  SpO2:  97%  97%  Weight:      Height:        Intake/Output Summary (Last 24 hours) at 03/07/2023 1153 Last data filed at 03/07/2023 0831 Gross per 24 hour  Intake 718.09 ml  Output 400 ml  Net 318.09 ml    Filed Weights   03/04/23 0419 03/06/23 0323 03/07/23 0325  Weight: 67 kg 64.8 kg 65.6 kg    Examination: General exam: Appears calm and comfortable  Respiratory system: Clear to auscultation. Respiratory effort normal. Cardiovascular system: S1 & S2 heard, RRR. No JVD, murmurs, rubs, gallops or clicks. No pedal edema. Gastrointestinal system: Abdomen is nondistended, soft and nontender. No organomegaly or masses felt. Normal bowel sounds heard. Central nervous system: Alert and  oriented. No focal neurological deficits. Extremities: Symmetric 5 x 5 power. Skin: No rashes, lesions or ulcers.  Psychiatry: Judgement and insight appear normal. Mood & affect appropriate.   Data Reviewed: I have personally reviewed following labs and imaging studies  CBC: Recent Labs  Lab 03/01/23 0309 03/02/23 0438 03/03/23 0139 03/07/23 0832  WBC 6.4 4.4 3.3* 3.4*  NEUTROABS 5.1  --   --  1.6*  HGB 10.2* 9.8* 10.2* 9.7*  HCT 31.8* 30.5* 29.8* 28.8*  MCV 109.7* 109.7* 104.9* 105.9*  PLT 106* 105* 127* 203    Basic Metabolic Panel: Recent Labs  Lab 03/02/23 0438 03/02/23 1501 03/02/23 1709 03/03/23 0139 03/03/23 1635 03/04/23 0541 03/04/23 1739 03/05/23 0432 03/07/23 0832  NA 143 141  --  138  --  138  --   --  136  K 3.7 3.9  --  3.7  --  4.0  --   --  4.3  CL 115* 115*  --  112*  --  109  --   --  107  CO2 21* 22  --  21*  --  20*  --   --  21*  GLUCOSE 89 91  --  93  --  98  --   --  117*  BUN 13 16  --  14  --  20  --   --  15  CREATININE 0.69 0.78  --   0.72  --  0.72  --   --  0.67  CALCIUM 9.5 9.1  --  9.1  --  9.1  --   --  9.0  MG 1.9 1.8   < > 2.4 1.8 1.8 1.9 1.8  --   PHOS 2.8  --    < > 2.9 3.0 3.0 3.3 3.0  --    < > = values in this interval not displayed.    GFR: Estimated Creatinine Clearance: 55.7 mL/min (by C-G formula based on SCr of 0.67 mg/dL). Liver Function Tests: Recent Labs  Lab 03/01/23 0309 03/02/23 0438 03/03/23 0139 03/04/23 0541 03/07/23 0832  AST 40 39 40 44* 69*  ALT 28 26 26 26  45*  ALKPHOS 157* 170* 191* 201* 218*  BILITOT 2.9* 2.6* 2.8* 2.5* 1.5*  PROT 6.5 6.3* 6.3* 6.2* 5.8*  ALBUMIN 4.0 3.6 3.3* 3.2* 2.8*    No results for input(s): "LIPASE", "AMYLASE" in the last 168 hours. Recent Labs  Lab 03/01/23 0309 03/02/23 0438 03/03/23 0139 03/04/23 0541  AMMONIA 50* 45* 76* 44*    Coagulation Profile: Recent Labs  Lab 03/01/23 0309 03/02/23 0438 03/03/23 0139  INR 2.1* 2.1* 1.8*    Cardiac Enzymes: No results for input(s): "CKTOTAL", "CKMB", "CKMBINDEX", "TROPONINI" in the last 168 hours. BNP (last 3 results) No results for input(s): "PROBNP" in the last 8760 hours. HbA1C: No results for input(s): "HGBA1C" in the last 72 hours. CBG: Recent Labs  Lab 03/07/23 0330 03/07/23 0349 03/07/23 0730 03/07/23 1001 03/07/23 1110  GLUCAP 66* 77 71 64* 89    Lipid Profile: No results for input(s): "CHOL", "HDL", "LDLCALC", "TRIG", "CHOLHDL", "LDLDIRECT" in the last 72 hours. Thyroid Function Tests: No results for input(s): "TSH", "T4TOTAL", "FREET4", "T3FREE", "THYROIDAB" in the last 72 hours.  Anemia Panel: No results for input(s): "VITAMINB12", "FOLATE", "FERRITIN", "TIBC", "IRON", "RETICCTPCT" in the last 72 hours. Sepsis Labs: No results for input(s): "PROCALCITON", "LATICACIDVEN" in the last 168 hours.   Recent Results (from the past 240 hour(s))  Urine Culture     Status:  None   Collection Time: 02/25/23  8:06 PM   Specimen: Urine, Clean Catch  Result Value Ref Range Status    Specimen Description   Final    URINE, CLEAN CATCH Performed at [facility], [address] [place], [place] [address]    Special Requests   Final    NONE Performed at [facility], [address], [place], [place] [address]    Culture   Final    NO GROWTH Performed at [facility], [address] [place], [place] [address]    Report Status 02/26/2023 FINAL  Final  MRSA Next Gen by PCR, Nasal     Status: None   Collection Time: 02/26/23 12:16 AM   Specimen: Nasal Mucosa; Nasal Swab  Result Value Ref Range Status   MRSA by PCR Next Gen NOT DETECTED NOT DETECTED Final    Comment: (NOTE) The GeneXpert MRSA Assay (FDA approved for NASAL specimens only), is one component of a comprehensive MRSA colonization surveillance program. It is not intended to diagnose MRSA infection nor to guide or monitor treatment for MRSA infections. Test performance is not FDA approved in patients less than 74 years old. Performed at [facility], [address], [place], [place] [address]   Culture, blood (Routine X 2) w Reflex to ID Panel     Status: None   Collection Time: 02/26/23  2:14 AM   Specimen: BLOOD  Result Value Ref Range Status   Specimen Description   Final    BLOOD LEFT ANTECUBITAL Performed at [facility], [address], [place], [place] [address]    Special Requests   Final    BOTTLES DRAWN AEROBIC AND ANAEROBIC Blood Culture adequate volume Performed at [facility], [address], [place], [place] [address]    Culture   Final    NO GROWTH 5 DAYS Performed at [facility], [address] [place], [place] [address]    Report Status 03/03/2023 FINAL  Final  Culture, blood (Routine X 2) w Reflex to ID Panel     Status: None   Collection Time: 02/26/23  2:14 AM   Specimen: BLOOD  Result Value Ref Range Status   Specimen Description   Final    BLOOD LEFT  ANTECUBITAL Performed at [facility], [address] [place], [place] [address]    Special Requests   Final    BOTTLES DRAWN AEROBIC AND ANAEROBIC Blood Culture adequate volume Performed at [facility], [address], [place], [place] [address]    Culture   Final    NO GROWTH 5 DAYS Performed at [facility], [address], [place], [place] [address]    Report Status 03/03/2023 FINAL  Final  MRSA Next Gen by PCR, Nasal     Status: None   Collection Time: 03/02/23  7:40 PM   Specimen: Nasal Mucosa; Nasal Swab  Result Value Ref Range Status   MRSA by PCR Next Gen NOT DETECTED NOT DETECTED Final    Comment: (NOTE) The GeneXpert MRSA Assay (FDA approved for NASAL specimens only), is one component of a comprehensive MRSA colonization surveillance program. It is not intended to diagnose MRSA infection nor to guide or monitor treatment for MRSA infections. Test performance is not FDA approved in patients less than 28 years old. Performed at [facility], [address] [place], [place] [address]  Radiology Studies: No results found.  Scheduled Meds:  (feeding supplement) PROSource Plus  30 mL Oral BID BM   Chlorhexidine Gluconate Cloth  6 each Topical Daily   copper  6 mg Oral Daily   enoxaparin (LOVENOX) injection  40 mg Subcutaneous Q24H   feeding supplement  237 mL Oral TID BM   lactulose  30 g Oral TID   levothyroxine  75 mcg Oral Q0600   midodrine  15 mg Oral TID WC   mouth rinse  15 mL Mouth Rinse 4 times per day   rifaximin  550 mg Oral BID   Continuous Infusions:  dextrose 5 % and 0.9% NaCl 75 mL/hr at 03/07/23 0510      LOS: 10 days   Hughie Closs, MD [facility]  03/07/2023, 11:53 AM   *Please note that this is a verbal dictation therefore any spelling or grammatical errors are due to the "Dragon Medical One" system interpretation.  Please page via Amion and do not message via secure chat for  urgent patient care matters. Secure chat can be used for non urgent patient care matters.  How to contact the [facility] Attending or Consulting provider 7A - 7P or covering provider during after hours 7P -7A, for this patient?  Check the care team in [facility] and look for a) attending/consulting TRH provider listed and b) the [facility] team listed. Page or secure chat 7A-7P. Log into [email or web address] and use [place]'s universal password to access. If you do not have the password, please contact the hospital operator. Locate the [facility] provider you are looking for under [facility] and page to a number that you can be directly reached. If you still have difficulty reaching the provider, please page the [facility] (Director on Call) for the Hospitalists listed on amion for assistance.

## 2023-03-07 NOTE — Progress Notes (Signed)
Pt mews red , pt asymptomatic. Husband states that pt's temp drops very low at times. Rectal temp was checked per provider request. Rectal temp was 35.0. Warm blankets applied , room temp heat adjusted. Provider aware, see new orders. Consulting civil engineer and rapid response RN aware. Call bell within reach, pt and husband has no concerns at this time.   03/06/23 2016  Vitals  Temp (!) 95.9 F (35.5 C)  Temp Source Axillary  BP (!) 99/44  MAP (mmHg) (!) 60  BP Location Right Arm  BP Method Automatic  Patient Position (if appropriate) Lying  Pulse Rate (!) 45  ECG Heart Rate (!) 46  Resp 12  Level of Consciousness  Level of Consciousness Alert  MEWS COLOR  MEWS Score Color Red  Oxygen Therapy  SpO2 99 %  O2 Device Room Air  MEWS Score  MEWS Temp 1  MEWS Systolic 1  MEWS Pulse 1  MEWS RR 1  MEWS LOC 0  MEWS Score 4  Provider Notification  Provider Name/Title Rathore MD  Date Provider Notified 03/06/23  Method of Notification Page  Notification Reason Change in status (Red mews)  Provider response Evaluate remotely  Date of Provider Response 03/06/23  Rapid Response Notification  Name of Rapid Response RN Notified Mindy  Date Rapid Response Notified 03/06/23  Time Rapid Response Notified 2052

## 2023-03-07 NOTE — Plan of Care (Signed)

## 2023-03-08 DIAGNOSIS — K7682 Hepatic encephalopathy: Secondary | ICD-10-CM | POA: Diagnosis not present

## 2023-03-08 LAB — GLUCOSE, CAPILLARY
Glucose-Capillary: 107 mg/dL — ABNORMAL HIGH (ref 70–99)
Glucose-Capillary: 57 mg/dL — ABNORMAL LOW (ref 70–99)
Glucose-Capillary: 63 mg/dL — ABNORMAL LOW (ref 70–99)
Glucose-Capillary: 69 mg/dL — ABNORMAL LOW (ref 70–99)
Glucose-Capillary: 73 mg/dL (ref 70–99)
Glucose-Capillary: 93 mg/dL (ref 70–99)
Glucose-Capillary: 94 mg/dL (ref 70–99)

## 2023-03-08 MED ORDER — PROSOURCE PLUS PO LIQD
30.0000 mL | Freq: Three times a day (TID) | ORAL | Status: DC
  Administered 2023-03-08 – 2023-03-13 (×14): 30 mL via ORAL
  Filled 2023-03-08 (×14): qty 30

## 2023-03-08 NOTE — TOC Progression Note (Signed)
Transition of Care [facility]) - Progression Note    Patient Details  Name: [name] MRN: [id number] Date of Birth: 12/20/1950  [facility] CM/SW Contact  Leone Haven, RN Phone Number: 03/08/2023, 11:52 AM  Clinical Narrative:    Peer to peer for CIR was denied by insurance per CIR rep,  patient and spouse wants to appeal the denial, which will take up to 72 hrs.  Will await appeal results.    Expected Discharge Plan: IP Rehab Facility Barriers to Discharge: Continued Medical Work up  Expected Discharge Plan and Services In-house Referral: NA Discharge Planning Services: CM Consult Post Acute Care Choice:  (unknown) Living arrangements for the past 2 months: Single Family Home                 DME Arranged: N/A DME Agency: NA       HH Arranged: NA HH Agency: NA         Social Determinants of Health (SDOH) Interventions SDOH Screenings   Food Insecurity: No Food Insecurity (02/26/2023)  Housing: Low Risk  (02/26/2023)  Transportation Needs: No Transportation Needs (02/26/2023)  Utilities: Not At Risk (02/26/2023)  Alcohol Screen: Low Risk  (04/01/2022)  Depression (PHQ2-9): Low Risk  (02/24/2023)  Financial Resource Strain: Low Risk  (04/01/2022)  Physical Activity: Insufficiently Active (04/01/2022)  Social Connections: Moderately Integrated (04/01/2022)  Stress: No Stress Concern Present (04/01/2022)  Tobacco Use: Low Risk  (02/25/2023)    Readmission Risk Interventions    03/02/2023    3:19 PM  Readmission Risk Prevention Plan  Transportation Screening Complete  PCP or Specialist Appt within 3-5 Days Complete  HRI or Home Care Consult Complete  Social Work Consult for Recovery Care Planning/Counseling Complete  Medication Review Oceanographer) Complete

## 2023-03-08 NOTE — Progress Notes (Addendum)
PROGRESS NOTE    [name]  YBW:[id number] DOB: 1951-05-27 DOA: 02/25/2023 PCP: Eden Emms, NP   Brief Narrative:  72 year old female with history of colon cancer thousand +3, status post partial colectomy and chemotherapy, history of liver cirrhosis felt secondary to previous chemotherapy with oxaliplatin, s/p TIPS procedure at [facility] in 2012, hepatic encephalopathy, came to the hospital with confusion for past 2 days. As per patient has been patient broke her left wrist in March at that time she was started on opioid-based analgesics which caused constipation.  Patient's dose of lactulose was increased from 45 cc 3 times a day to 60 cc 3 times a day. Despite this patient was not having bowel movements, she has been incontinent of urine and stools. Patient has been more altered with nausea vomiting for past 2 days.  Missed dose of lactulose.  Was found to have temperature of 94 F at home. In the ED she was found to be lethargic and hypothermic.  Bair hugger was applied and hypothermia resolved. UA was positive for pyuria, ammonia was elevated at 163.    Assessment & Plan:   Principal Problem:   Acute hepatic encephalopathy Active Problems:   Decompensated liver cirrhosis with portal HTN and gastric varices   AKI (acute kidney injury)   Hypothyroidism   Hypertension   Hx of colon cancer, stage III   Bradycardia   Anemia   Acute cystitis with hematuria   Protein-calorie malnutrition, severe  Acute hepatic encephalopathy -Ammonia level elevated at 163 on admission which improved with lactulose.  Due to excessive number of loose bowel movements, lactulose dose was reduced to every 8 hours on 03/07/2023.  Patient has had 5 bowel movements in last 24 hours, I offered her to reduce further if she wants but she prefers to continue to take current dosage.   Hypoglycemia: Still with intermittent hypoglycemia.  Patient was encouraged to eat more.  I am going to increase her  feeding supplement 3 times a day.  Checking C-peptide.   Hypothermia: Secondary to acute hepatic encephalopathy.  Now resolved.   Acute kidney injury: Resolved.    Hypotension: Blood pressure improved and stable.  Continue midodrine 15 mg p.o. 3 times daily.   Liver cirrhosis with portal hypertension s/p TIPS procedure in 2012 -Status post TIPS procedure in 2012. Developed liver cirrhosis after chemotherapy with oxaliplatin -MELD score 15, 6% estimated 45-month mortality    UTI ruled out: Antibiotics discontinued.  Hypothyroidism: Continue Synthroid.  Pancytopenia: Likely chemotherapy-induced.  Stable.  SVT episodes with bradycardia, new : While at [facility] long, patient had episodes of bradycardia with pauses, HR lowest 34, given atropine 1mg  x1.  After discussing with cardiology as well as PCCM, cardiology recommended transferring to ICU at [facility] for possible pacing.  However since arrival to [facility], patient's rhythm has mainly been normal sinus rhythm so cardiology has now recommended supportive care and has signed off.  Patient is asymptomatic.  She is bradycardic.   Hx of colon cancer, stage III - status post partial colectomy and chemotherapy, history of liver cirrhosis felt secondary to previous chemotherapy with oxaliplatin, s/p TIPS procedure   Severe protein calorie malnutrition: RD on board.   Disposition: Patient has been seen by PT OT and they recommended CIR. reportedly, insurance did not approve CIR.  Family is now pursuing appeal.  DVT prophylaxis: enoxaparin (LOVENOX) injection 40 mg Start: 03/02/23 2000 SCDs Start: 02/26/23 0035   Code Status: Full Code  Family Communication: None  present at bedside.  Plan of care discussed with patient in length and he/she verbalized understanding and agreed with it.  Status is: Inpatient Remains inpatient appropriate because: Improving.  CIR following for potential admission.  Insurance authorization denied.  Family  Ling.   Estimated body mass index is 25.4 kg/m as calculated from the following:   Height as of this encounter: 5\' 4"  (1.626 m).   Weight as of this encounter: 67.1 kg.    Nutritional Assessment: Body mass index is 25.4 kg/m.Marland Kitchen Seen by dietician.  I agree with the assessment and plan as outlined below: Nutrition Status: Nutrition Problem: Severe Malnutrition Etiology: acute illness Signs/Symptoms: moderate fat depletion, moderate muscle depletion, energy intake < or equal to 50% for > or equal to 5 days, percent weight loss (14.6% in 3 weeks) Percent weight loss: 14.6 % (in 3 weeks) Interventions: Refer to RD note for recommendations  . Skin Assessment: I have examined the patient's skin and I agree with the wound assessment as performed by the wound care RN as outlined below:    Consultants:  GI  Procedures:  None  Antimicrobials:  Anti-infectives (From admission, onward)    Start     Dose/Rate Route Frequency Ordered Stop   03/02/23 1200  rifaximin (XIFAXAN) tablet 550 mg        550 mg Oral 2 times daily 03/02/23 1039     02/26/23 2200  cefTRIAXone (ROCEPHIN) 2 g in sodium chloride 0.9 % 100 mL IVPB  Status:  Discontinued        2 g 200 mL/hr over 30 Minutes Intravenous Every 24 hours 02/26/23 0034 02/27/23 0953   02/25/23 2200  cefTRIAXone (ROCEPHIN) 1 g in sodium chloride 0.9 % 100 mL IVPB  Status:  Discontinued        1 g 200 mL/hr over 30 Minutes Intravenous Every 24 hours 02/25/23 2118 02/26/23 0034         Subjective: Patient seen and examined.  No complaints.  Fully alert and oriented.  Objective: Vitals:   03/08/23 0011 03/08/23 0413 03/08/23 0856 03/08/23 1112  BP: (!) 103/56 (!) 99/55 (!) 96/51 (!) 104/54  Pulse: (!) 53 82 (!) 51 (!) 50  Resp: 18 17 15 15   Temp: 98.3 F (36.8 C) 97.7 F (36.5 C) 97.8 F (36.6 C) 97.8 F (36.6 C)  TempSrc: Oral Oral Oral Oral  SpO2: 98% 99% 99% 98%  Weight:  67.1 kg    Height:        Intake/Output Summary  (Last 24 hours) at 03/08/2023 1318 Last data filed at 03/08/2023 1156 Gross per 24 hour  Intake 956.7 ml  Output 500 ml  Net 456.7 ml    Filed Weights   03/06/23 0323 03/07/23 0325 03/08/23 0413  Weight: 64.8 kg 65.6 kg 67.1 kg    Examination: General exam: Appears calm and comfortable  Respiratory system: Clear to auscultation. Respiratory effort normal. Cardiovascular system: S1 & S2 heard, RRR. No JVD, murmurs, rubs, gallops or clicks. No pedal edema. Gastrointestinal system: Abdomen is nondistended, soft and nontender. No organomegaly or masses felt. Normal bowel sounds heard. Central nervous system: Alert and oriented. No focal neurological deficits. Extremities: Symmetric 5 x 5 power. Skin: No rashes, lesions or ulcers.  Psychiatry: Judgement and insight appear normal. Mood & affect appropriate.   Data Reviewed: I have personally reviewed following labs and imaging studies  CBC: Recent Labs  Lab 03/02/23 0438 03/03/23 0139 03/07/23 0832  WBC 4.4 3.3* 3.4*  NEUTROABS  --   --  1.6*  HGB 9.8* 10.2* 9.7*  HCT 30.5* 29.8* 28.8*  MCV 109.7* 104.9* 105.9*  PLT 105* 127* 203    Basic Metabolic Panel: Recent Labs  Lab 03/02/23 0438 03/02/23 1501 03/02/23 1709 03/03/23 0139 03/03/23 1635 03/04/23 0541 03/04/23 1739 03/05/23 0432 03/07/23 0832  NA 143 141  --  138  --  138  --   --  136  K 3.7 3.9  --  3.7  --  4.0  --   --  4.3  CL 115* 115*  --  112*  --  109  --   --  107  CO2 21* 22  --  21*  --  20*  --   --  21*  GLUCOSE 89 91  --  93  --  98  --   --  117*  BUN 13 16  --  14  --  20  --   --  15  CREATININE 0.69 0.78  --  0.72  --  0.72  --   --  0.67  CALCIUM 9.5 9.1  --  9.1  --  9.1  --   --  9.0  MG 1.9 1.8   < > 2.4 1.8 1.8 1.9 1.8  --   PHOS 2.8  --    < > 2.9 3.0 3.0 3.3 3.0  --    < > = values in this interval not displayed.    GFR: Estimated Creatinine Clearance: 60.8 mL/min (by C-G formula based on SCr of 0.67 mg/dL). Liver Function  Tests: Recent Labs  Lab 03/02/23 0438 03/03/23 0139 03/04/23 0541 03/07/23 0832  AST 39 40 44* 69*  ALT 45*  ALKPHOS 170* 191* 201* 218*  BILITOT 2.6* 2.8* 2.5* 1.5*  PROT 6.3* 6.3* 6.2* 5.8*  ALBUMIN 3.6 3.3* 3.2* 2.8*    No results for input(s): "LIPASE", "AMYLASE" in the last 168 hours. Recent Labs  Lab 03/02/23 0438 03/03/23 0139 03/04/23 0541  AMMONIA 45* 76* 44*    Coagulation Profile: Recent Labs  Lab 03/02/23 0438 03/03/23 0139  INR 2.1* 1.8*    Cardiac Enzymes: No results for input(s): "CKTOTAL", "CKMB", "CKMBINDEX", "TROPONINI" in the last 168 hours. BNP (last 3 results) No results for input(s): "PROBNP" in the last 8760 hours. HbA1C: No results for input(s): "HGBA1C" in the last 72 hours. CBG: Recent Labs  Lab 03/08/23 0011 03/08/23 0411 03/08/23 0740 03/08/23 1106 03/08/23 1130  GLUCAP 94 69* 73 57* 63*    Lipid Profile: No results for input(s): "CHOL", "HDL", "LDLCALC", "TRIG", "CHOLHDL", "LDLDIRECT" in the last 72 hours. Thyroid Function Tests: No results for input(s): "TSH", "T4TOTAL", "FREET4", "T3FREE", "THYROIDAB" in the last 72 hours.  Anemia Panel: No results for input(s): "VITAMINB12", "FOLATE", "FERRITIN", "TIBC", "IRON", "RETICCTPCT" in the last 72 hours. Sepsis Labs: No results for input(s): "PROCALCITON", "LATICACIDVEN" in the last 168 hours.   Recent Results (from the past 240 hour(s))  MRSA Next Gen by PCR, Nasal     Status: None   Collection Time: 03/02/23  7:40 PM   Specimen: Nasal Mucosa; Nasal Swab  Result Value Ref Range Status   MRSA by PCR Next Gen NOT DETECTED NOT DETECTED Final    Comment: (NOTE) The GeneXpert MRSA Assay (FDA approved for NASAL specimens only), is one component of a comprehensive MRSA colonization surveillance program. It is not intended to diagnose MRSA infection nor to guide or monitor treatment for MRSA infections. Test performance is not FDA approved in patients  less than 47  years old. Performed at [facility], [address], [place], [place] [address]      Radiology Studies: No results found.  Scheduled Meds:  (feeding supplement) PROSource Plus  30 mL Oral BID BM   Chlorhexidine Gluconate Cloth  6 each Topical Daily   copper  6 mg Oral Daily   enoxaparin (LOVENOX) injection  40 mg Subcutaneous Q24H   feeding supplement  237 mL Oral TID BM   lactulose  30 g Oral TID   levothyroxine  75 mcg Oral Q0600   midodrine  15 mg Oral TID WC   mouth rinse  15 mL Mouth Rinse 4 times per day   rifaximin  550 mg Oral BID   Continuous Infusions:      LOS: 11 days   Hughie Closs, MD [facility]  03/08/2023, 1:18 PM   *Please note that this is a verbal dictation therefore any spelling or grammatical errors are due to the "Dragon Medical One" system interpretation.  Please page via Amion and do not message via secure chat for urgent patient care matters. Secure chat can be used for non urgent patient care matters.  How to contact the [facility] Attending or Consulting provider 7A - 7P or covering provider during after hours 7P -7A, for this patient?  Check the care team in [facility] and look for a) attending/consulting TRH provider listed and b) the [facility] team listed. Page or secure chat 7A-7P. Log into [email or web address] and use [facility] universal password to access. If you do not have the password, please contact the hospital operator. Locate the [facility] provider you are looking for under [facility] and page to a number that you can be directly reached. If you still have difficulty reaching the provider, please page the [facility] (Director on Call) for the Hospitalists listed on amion for assistance.

## 2023-03-08 NOTE — Progress Notes (Signed)
Mobility Specialist Progress Note:   03/08/23 0915  Mobility  Activity Transferred from bed to chair  Level of Assistance Minimal assist, patient does 75% or more  Assistive Device Other (Comment) (HHA)  Distance Ambulated (ft) 3 ft  LUE Weight Bearing NWB  Activity Response Tolerated well  Mobility Referral Yes  $Mobility charge 1 Mobility   Pt eager for mobility session. Transferred to chair with HHA and minA. Pt left with all needs met, alarm on.   Addison Lank Mobility Specialist Please contact via SecureChat or  Rehab office at [phone]

## 2023-03-08 NOTE — Progress Notes (Signed)
Inpatient Rehab Admissions Coordinator:   Met with pt and her spouse at bedside and discussed CIR denial.  Both have concerns with medication management with transition to either SNF or home as she is taking some medications in the acute setting that she does not take at home due to cost.  Encouraged discussion with physician for this.  Pt/family would like to appeal insurance decision for CIR and I will start this process today.  Will follow.  Estill Dooms, PT, DPT Admissions Coordinator [phone] 03/08/23  12:50 PM

## 2023-03-08 NOTE — Progress Notes (Signed)
Occupational Therapy Treatment Patient Details Name: [name] MRN: [id number] DOB: Apr 19, 1951 Today's Date: 03/08/2023   History of present illness Patient is 72 year old female admitted on 02/25/23 with acute hepatic encephalopathy. Pt with fall in March 2024 that resulted in L wrist fx.  She was on opioids which caused constipation and even with increased lactulose she was not having bowel movements and developed encephalopathy and hypothermia.  Pt with hx including but not limited to colon CA, liver cirrhosis, TIPS, hepatic encephalopathy.   OT comments  Pt continuing to progress towards patient focused goals. Trialed increasing patient functional ambulation skills and patient was able to progress Min guard 1+ no HHA needed. Pt balance still remains a bit wobbly and Pt also notes balance impairments when walking out in hallway. Pt static standing balance has improved, completing grooming routine at sink without UE support but does need some assist for setup d/t LUE impairments. OT to continue to progress patient as able. Pt continues to be appropriate for CIR at this time, if patient were to be denied appeal may seek to increase OT frequency to allow for a successful transition upon DC.   Recommendations for follow up therapy are one component of a multi-disciplinary discharge planning process, led by the attending physician.  Recommendations may be updated based on patient status, additional functional criteria and insurance authorization.    Assistance Recommended at Discharge Frequent or constant Supervision/Assistance  Patient can return home with the following  Assistance with cooking/housework;Assist for transportation;Help with stairs or ramp for entrance;A little help with bathing/dressing/bathroom;A little help with walking and/or transfers   Equipment Recommendations  None recommended by OT    Recommendations for Other Services      Precautions / Restrictions  Precautions Precautions: Fall Restrictions Weight Bearing Restrictions: No LUE Weight Bearing: Non weight bearing       Mobility Bed Mobility Overal bed mobility: Needs Assistance             General bed mobility comments: Pt rec'd and left sitting in recliner    Transfers Overall transfer level: Needs assistance Equipment used: 2 person hand held assist Transfers: Sit to/from Stand Sit to Stand: Min guard                 Balance Overall balance assessment: Needs assistance     Sitting balance - Comments: unable to fully assess, Pt in recliner   Standing balance support: Single extremity supported, During functional activity Standing balance-Leahy Scale: Fair                             ADL either performed or assessed with clinical judgement   ADL Overall ADL's : Needs assistance/impaired     Grooming: Standing;Supervision/safety;Oral care;Wash/dry face;Wash/dry hands Grooming Details (indicate cue type and reason): Pt standing at sink with very close supervision                 Toilet Transfer: Min guard;Ambulation Toilet Transfer Details (indicate cue type and reason): +1 HHA Toileting- Clothing Manipulation and Hygiene: Min guard;Sitting/lateral lean Toileting - Clothing Manipulation Details (indicate cue type and reason): Pt complete seated pericare     Functional mobility during ADLs: Min guard;+2 for physical assistance General ADL Comments: Pt progressed with functional mobility throughout session to Min guard 1+ HHA to Min guard no HHA    Extremity/Trunk Assessment  Vision       Perception     Praxis      Cognition Arousal/Alertness: Awake/alert Behavior During Therapy: WFL for tasks assessed/performed Overall Cognitive Status: Within Functional Limits for tasks assessed                                          Exercises      Shoulder Instructions       General Comments HR  at 49 bpm while resting, increased to 105 Max during functional ambulation. Pt reports a moderate challenge at end of ambulation    Pertinent Vitals/ Pain       Pain Assessment Pain Assessment: No/denies pain  Home Living                                          Prior Functioning/Environment              Frequency  Min 2X/week        Progress Toward Goals  OT Goals(current goals can now be found in the care plan section)  Progress towards OT goals: Progressing toward goals  Acute Rehab OT Goals Patient Stated Goal: To go to rehab OT Goal Formulation: With patient/family Time For Goal Achievement: 03/15/23 Potential to Achieve Goals: Good  Plan Discharge plan remains appropriate;Frequency remains appropriate    Co-evaluation    PT/OT/SLP Co-Evaluation/Treatment: Yes Reason for Co-Treatment: For patient/therapist safety PT goals addressed during session: Mobility/safety with mobility;Balance OT goals addressed during session: ADL's and self-care      AM-PAC OT "6 Clicks" Daily Activity     Outcome Measure   Help from another person eating meals?: A Little Help from another person taking care of personal grooming?: A Little Help from another person toileting, which includes using toliet, bedpan, or urinal?: A Little Help from another person bathing (including washing, rinsing, drying)?: A Little Help from another person to put on and taking off regular upper body clothing?: A Little Help from another person to put on and taking off regular lower body clothing?: A Little 6 Click Score: 18    End of Session Equipment Utilized During Treatment: Gait belt  OT Visit Diagnosis: Unsteadiness on feet (R26.81);Other abnormalities of gait and mobility (R26.89);Muscle weakness (generalized) (M62.81)   Activity Tolerance Patient tolerated treatment well   Patient Left in chair;with call bell/phone within reach;with chair alarm set;with family/visitor  present   Nurse Communication Mobility status        Time: 3532-9924 OT Time Calculation (min): 38 min  Charges: OT General Charges $OT Visit: 1 Visit OT Treatments $Therapeutic Activity: 8-22 mins  03/08/2023  AB, OTR/L  Acute Rehabilitation Services  Office: [phone]   Tristan Schroeder 03/08/2023, 12:14 PM

## 2023-03-08 NOTE — Progress Notes (Signed)
Physical Therapy Treatment Patient Details Name: Roaa Attig MRN: [id number] DOB: 09/09/1951 Today's Date: 03/08/2023   History of Present Illness Patient is 72 year old female admitted on 02/25/23 with acute hepatic encephalopathy. Pt with fall in March 2024 that resulted in L wrist fx.  She was on opioids which caused constipation and even with increased lactulose she was not having bowel movements and developed encephalopathy and hypothermia.  Pt with hx including but not limited to colon CA, liver cirrhosis, TIPS, hepatic encephalopathy.    PT Comments    Pt tolerated treatment well today. Co-treat with OT. Pt was able to progress ambulation in hallway today. Pt with continued slow cautious gait. No LOB noted however pt did drift L/R. Pt started with +2 HHA however progressed to +1 HHA before using no AD with Min guard A. Despite gait progression, pt still noted to have some balance deficits and is not at her baseline of independent yet. Pt remains an excellent candidate for CIR as she can tolerated 3 hours of therapy a day, continues to be highly motivated, and has supportive husband at home. PT will continue to follow.  Recommendations for follow up therapy are one component of a multi-disciplinary discharge planning process, led by the attending physician.  Recommendations may be updated based on patient status, additional functional criteria and insurance authorization.  Follow Up Recommendations  Can patient physically be transported by private vehicle: No    Assistance Recommended at Discharge Frequent or constant Supervision/Assistance  Patient can return home with the following A little help with walking and/or transfers;A little help with bathing/dressing/bathroom;Assistance with cooking/housework;Assist for transportation;Help with stairs or ramp for entrance;Direct supervision/assist for medications management;Direct supervision/assist for financial management   Equipment  Recommendations  Other (comment) (Per accepting facility)    Recommendations for Other Services       Precautions / Restrictions Precautions Precautions: Fall Restrictions Weight Bearing Restrictions: No LUE Weight Bearing: Non weight bearing     Mobility  Bed Mobility               General bed mobility comments: Pt up in chair    Transfers Overall transfer level: Needs assistance Equipment used: 2 person hand held assist, 1 person hand held assist Transfers: Sit to/from Stand Sit to Stand: Min assist           General transfer comment: Multiple sit to stands performed. Initially sit to stand performed with +2 HHA with subsequent sit to stands performed with +1 HHA    Ambulation/Gait Ambulation/Gait assistance: Min guard, +2 safety/equipment Gait Distance (Feet): 450 Feet Assistive device: 2 person hand held assist, 1 person hand held assist, None Gait Pattern/deviations: Step-through pattern, Decreased stride length, Narrow base of support, Drifts right/left, Staggering right, Staggering left Gait velocity: decreased     General Gait Details: Pt with continued slow cautious gait. no LOB noted however pt did drift L/R. Pt started with +2 HHA however progressed to +1 HHA before using no AD with Min guard A. Pt also cued for arm swing with no AD.   Stairs             Wheelchair Mobility    Modified Rankin (Stroke Patients Only)       Balance Overall balance assessment: Needs assistance Sitting-balance support: Feet supported Sitting balance-Leahy Scale: Good     Standing balance support: Single extremity supported, During functional activity Standing balance-Leahy Scale: Fair Standing balance comment: Pt able to stand at sink with OT to  perform ADLs for prolonged period of time.                            Cognition Arousal/Alertness: Awake/alert Behavior During Therapy: WFL for tasks assessed/performed Overall Cognitive Status:  Within Functional Limits for tasks assessed                                          Exercises      General Comments General comments (skin integrity, edema, etc.): HR at 49 bpm while resting, increased to 105 Max during functional ambulation. Pt reports a moderate challenge at end of ambulation      Pertinent Vitals/Pain Pain Assessment Pain Assessment: No/denies pain    Home Living                          Prior Function            PT Goals (current goals can now be found in the care plan section) Progress towards PT goals: Progressing toward goals    Frequency    Min 1X/week      PT Plan Current plan remains appropriate    Co-evaluation PT/OT/SLP Co-Evaluation/Treatment: Yes Reason for Co-Treatment: For patient/therapist safety PT goals addressed during session: Mobility/safety with mobility;Balance OT goals addressed during session: ADL's and self-care      AM-PAC PT "6 Clicks" Mobility   Outcome Measure  Help needed turning from your back to your side while in a flat bed without using bedrails?: A Little Help needed moving from lying on your back to sitting on the side of a flat bed without using bedrails?: A Little Help needed moving to and from a bed to a chair (including a wheelchair)?: A Little Help needed standing up from a chair using your arms (e.g., wheelchair or bedside chair)?: A Little Help needed to walk in hospital room?: A Little Help needed climbing 3-5 steps with a railing? : A Lot 6 Click Score: 17    End of Session Equipment Utilized During Treatment: Gait belt Activity Tolerance: Patient tolerated treatment well Patient left: in chair;with call bell/phone within reach;with chair alarm set;with family/visitor present Nurse Communication: Mobility status PT Visit Diagnosis: Other abnormalities of gait and mobility (R26.89);Muscle weakness (generalized) (M62.81)     Time: 9798-9211 PT Time Calculation  (min) (ACUTE ONLY): 40 min  Charges:  $Gait Training: 8-22 mins $Therapeutic Activity: 8-22 mins                     Shela Nevin, PT, DPT Acute Rehab Services [phone]    [name] 03/08/2023, 12:24 PM

## 2023-03-08 NOTE — Progress Notes (Addendum)
Inpatient Rehab Admissions Coordinator:   [facility] Community care requesting peer to peer to render determination on CIR prior auth request.  Dr. Riley Kill to complete.  Will update with determination.   Addendum: 1120: Peer to peer unsuccessful.  Will need to seek f/u at an alternative level of care (SNF vs [facility]).  Will let MD and TOC know and f/u with pt/family at bedside.   Estill Dooms, PT, DPT Admissions Coordinator [phone] 03/08/23  10:42 AM

## 2023-03-09 ENCOUNTER — Encounter: Payer: Medicare Other | Admitting: Pharmacist

## 2023-03-09 DIAGNOSIS — K7682 Hepatic encephalopathy: Secondary | ICD-10-CM | POA: Diagnosis not present

## 2023-03-09 LAB — GLUCOSE, CAPILLARY
Glucose-Capillary: 107 mg/dL — ABNORMAL HIGH (ref 70–99)
Glucose-Capillary: 64 mg/dL — ABNORMAL LOW (ref 70–99)
Glucose-Capillary: 81 mg/dL (ref 70–99)
Glucose-Capillary: 82 mg/dL (ref 70–99)
Glucose-Capillary: 83 mg/dL (ref 70–99)
Glucose-Capillary: 85 mg/dL (ref 70–99)
Glucose-Capillary: 86 mg/dL (ref 70–99)

## 2023-03-09 LAB — C-PEPTIDE: C-Peptide: 7.2 ng/mL — ABNORMAL HIGH (ref 1.1–4.4)

## 2023-03-09 NOTE — Progress Notes (Signed)
Inpatient Rehab Admissions Coordinator:   Awaiting determination from [facility] Medicare regarding expedited appeals.  Will follow.   Estill Dooms, PT, DPT Admissions Coordinator [phone] 03/09/23  10:14 AM

## 2023-03-09 NOTE — Progress Notes (Signed)
PROGRESS NOTE    [name]  ZOX:[id number] DOB: [birth year] DOA: 02/25/2023 PCP: Eden Emms, NP   Brief Narrative:  72 year old female with history of colon cancer thousand +3, status post partial colectomy and chemotherapy, history of liver cirrhosis felt secondary to previous chemotherapy with oxaliplatin, s/p TIPS procedure at [facility] in 2012, hepatic encephalopathy, came to the hospital with confusion for past 2 days. As per patient has been patient broke her left wrist in March at that time she was started on opioid-based analgesics which caused constipation.  Patient's dose of lactulose was increased from 45 cc 3 times a day to 60 cc 3 times a day. Despite this patient was not having bowel movements, she has been incontinent of urine and stools. Patient has been more altered with nausea vomiting for past 2 days.  Missed dose of lactulose.  Was found to have temperature of 94 F at home. In the ED she was found to be lethargic and hypothermic.  Bair hugger was applied and hypothermia resolved. UA was positive for pyuria, ammonia was elevated at 163.    Assessment & Plan:   Principal Problem:   Acute hepatic encephalopathy Active Problems:   Decompensated liver cirrhosis with portal HTN and gastric varices   AKI (acute kidney injury)   Hypothyroidism   Hypertension   Hx of colon cancer, stage III   Bradycardia   Anemia   Acute cystitis with hematuria   Protein-calorie malnutrition, severe  Acute hepatic encephalopathy -Ammonia level elevated at 163 on admission which improved with lactulose.  Due to excessive number of loose bowel movements, lactulose dose was reduced to every 8 hours on 03/07/2023.  Patient has had 3 bowel movements last 24 hours so we will continue current dose.  Patient was started on Xifaxan during this hospitalization.  Husband tells me that his co-pay for that medication is going to be $1200 and he will not be able to afford it and he prefers for  me to discontinue that medication here and see how she does on lactulose alone. xifaxan discontinued.   Hypoglycemia: Better.   Hypothermia: Secondary to acute hepatic encephalopathy.  Now resolved.   Acute kidney injury: Resolved.    Hypotension: Blood pressure low intermittently.  She is on maximum therapy.  Continue midodrine 15 mg p.o. 3 times daily.   Liver cirrhosis with portal hypertension s/p TIPS procedure in 2012 -Status post TIPS procedure in 2012. Developed liver cirrhosis after chemotherapy with oxaliplatin -MELD score 15, 6% estimated 70-month mortality    UTI ruled out: Antibiotics discontinued.  Hypothyroidism: Continue Synthroid.  Pancytopenia: Likely chemotherapy-induced.  Stable.  SVT episodes with bradycardia, new : While at [facility], patient had episodes of bradycardia with pauses, HR lowest 34, given atropine 1mg  x1.  After discussing with cardiology as well as PCCM, cardiology recommended transferring to ICU at [facility] for possible pacing.  However since arrival to [facility], patient's rhythm has mainly been normal sinus rhythm so cardiology has now recommended supportive care and has signed off.  Patient is asymptomatic.  She is bradycardic.   Hx of colon cancer, stage III - status post partial colectomy and chemotherapy, history of liver cirrhosis felt secondary to previous chemotherapy with oxaliplatin, s/p TIPS procedure   Severe protein calorie malnutrition: RD on board.   Disposition: Patient has been seen by PT OT and they recommended CIR. reportedly, insurance did not approve CIR.  Family is now pursuing appeal.  DVT prophylaxis: enoxaparin (LOVENOX) injection  40 mg Start: 03/02/23 2000 SCDs Start: 02/26/23 0035   Code Status: Full Code  Family Communication: None present at bedside.  Plan of care discussed with patient in length and he/she verbalized understanding and agreed with it.  Status is: Inpatient Remains inpatient appropriate because:  Improving.  CIR following for potential admission.  Insurance authorization denied.  Family appealing.    Estimated body mass index is 25.4 kg/m as calculated from the following:   Height as of this encounter:  (1.626 m).   Weight as of this encounter: 67.1 kg.    Nutritional Assessment: Body mass index is 25.4 kg/m.Marland Kitchen Seen by dietician.  I agree with the assessment and plan as outlined below: Nutrition Status: Nutrition Problem: Severe Malnutrition Etiology: acute illness Signs/Symptoms: moderate fat depletion, moderate muscle depletion, energy intake < or equal to 50% for > or equal to 5 days, percent weight loss (14.6% in 3 weeks) Percent weight loss: 14.6 % (in 3 weeks) Interventions: Refer to RD note for recommendations  . Skin Assessment: I have examined the patient's skin and I agree with the wound assessment as performed by the wound care RN as outlined below:    Consultants:  GI  Procedures:  None  Antimicrobials:  Anti-infectives (From admission, onward)    Start     Dose/Rate Route Frequency Ordered Stop   03/02/23 1200  rifaximin (XIFAXAN) tablet 550 mg  Status:  Discontinued        550 mg Oral 2 times daily 03/02/23 1039 03/09/23 0911   02/26/23 2200  cefTRIAXone (ROCEPHIN) 2 g in sodium chloride 0.9 % 100 mL IVPB  Status:  Discontinued        2 g 200 mL/hr over 30 Minutes Intravenous Every 24 hours 02/26/23 0034 02/27/23 0953   02/25/23 2200  cefTRIAXone (ROCEPHIN) 1 g in sodium chloride 0.9 % 100 mL IVPB  Status:  Discontinued        1 g 200 mL/hr over 30 Minutes Intravenous Every 24 hours 02/25/23 2118 02/26/23 0034         Subjective: Patient seen and examined.  No complaints.  She is fully alert and oriented.  Husband at the bedside.  Objective: Vitals:   03/09/23 0753 03/09/23 0852 03/09/23 0900 03/09/23 0930  BP: (!) 97/50 (!) 81/40 (!) 78/33 (!) 99/46  Pulse: 61     Resp: 17     Temp: 98 F (36.7 C)     TempSrc: Oral     SpO2: 98%      Weight:      Height:        Intake/Output Summary (Last 24 hours) at 03/09/2023 1019 Last data filed at 03/09/2023 0836 Gross per 24 hour  Intake 1120 ml  Output 402 ml  Net 718 ml    Filed Weights   03/06/23 0323 03/07/23 0325 03/08/23 0413  Weight: 64.8 kg 65.6 kg 67.1 kg    Examination:  General exam: Appears calm and comfortable  Respiratory system: Clear to auscultation. Respiratory effort normal. Cardiovascular system: S1 & S2 heard, RRR. No JVD, murmurs, rubs, gallops or clicks. No pedal edema. Gastrointestinal system: Abdomen is nondistended, soft and nontender. No organomegaly or masses felt. Normal bowel sounds heard. Central nervous system: Alert and oriented. No focal neurological deficits. Extremities: Symmetric 5 x 5 power. Skin: No rashes, lesions or ulcers.  Psychiatry: Judgement and insight appear normal. Mood & affect appropriate.    Data Reviewed: I have personally reviewed following labs and imaging studies  CBC: Recent Labs  Lab 03/03/23 0139 03/07/23 0832  WBC 3.3* 3.4*  NEUTROABS  --  1.6*  HGB 10.2* 9.7*  HCT 29.8* 28.8*  MCV 104.9* 105.9*  PLT 127* 203    Basic Metabolic Panel: Recent Labs  Lab 03/02/23 1501 03/02/23 1709 03/03/23 0139 03/03/23 1635 03/04/23 0541 03/04/23 1739 03/05/23 0432 03/07/23 0832  NA 141  --  138  --  138  --   --  136  K 3.9  --  3.7  --  4.0  --   --  4.3  CL 115*  --  112*  --  109  --   --  107  CO2 22  --  21*  --  20*  --   --  21*  GLUCOSE 91  --  93  --  98  --   --  117*  BUN 16  --  14  --  20  --   --  15  CREATININE 0.78  --  0.72  --  0.72  --   --  0.67  CALCIUM 9.1  --  9.1  --  9.1  --   --  9.0  MG 1.8   < > 2.4 1.8 1.8 1.9 1.8  --   PHOS  --    < > 2.9 3.0 3.0 3.3 3.0  --    < > = values in this interval not displayed.    GFR: Estimated Creatinine Clearance: 60.8 mL/min (by C-G formula based on SCr of 0.67 mg/dL). Liver Function Tests: Recent Labs  Lab 03/03/23 0139  03/04/23 0541 03/07/23 0832  AST 40 44* 69*  ALT 26 26 45*  ALKPHOS 191* 201* 218*  BILITOT 2.8* 2.5* 1.5*  PROT 6.3* 6.2* 5.8*  ALBUMIN 3.3* 3.2* 2.8*    No results for input(s): "LIPASE", "AMYLASE" in the last 168 hours. Recent Labs  Lab 03/03/23 0139 03/04/23 0541  AMMONIA 76* 44*    Coagulation Profile: Recent Labs  Lab 03/03/23 0139  INR 1.8*    Cardiac Enzymes: No results for input(s): "CKTOTAL", "CKMB", "CKMBINDEX", "TROPONINI" in the last 168 hours. BNP (last 3 results) No results for input(s): "PROBNP" in the last 8760 hours. HbA1C: No results for input(s): "HGBA1C" in the last 72 hours. CBG: Recent Labs  Lab 03/08/23 2111 03/09/23 0016 03/09/23 0429 03/09/23 0456 03/09/23 0812  GLUCAP 107* 86 64* 81 83    Lipid Profile: No results for input(s): "CHOL", "HDL", "LDLCALC", "TRIG", "CHOLHDL", "LDLDIRECT" in the last 72 hours. Thyroid Function Tests: No results for input(s): "TSH", "T4TOTAL", "FREET4", "T3FREE", "THYROIDAB" in the last 72 hours.  Anemia Panel: No results for input(s): "VITAMINB12", "FOLATE", "FERRITIN", "TIBC", "IRON", "RETICCTPCT" in the last 72 hours. Sepsis Labs: No results for input(s): "PROCALCITON", "LATICACIDVEN" in the last 168 hours.   Recent Results (from the past 240 hour(s))  MRSA Next Gen by PCR, Nasal     Status: None   Collection Time: 03/02/23  7:40 PM   Specimen: Nasal Mucosa; Nasal Swab  Result Value Ref Range Status   MRSA by PCR Next Gen NOT DETECTED NOT DETECTED Final    Comment: (NOTE) The GeneXpert MRSA Assay (FDA approved for NASAL specimens only), is one component of a comprehensive MRSA colonization surveillance program. It is not intended to diagnose MRSA infection nor to guide or monitor treatment for MRSA infections. Test performance is not FDA approved in patients less than 30 years old. Performed at [facility], [address] [place], [place] [address]      Radiology Studies: No  results found.  Scheduled Meds:  (feeding supplement) PROSource Plus  30 mL Oral TID BM   copper  6 mg Oral Daily   enoxaparin (LOVENOX) injection  40 mg Subcutaneous Q24H   feeding supplement  237 mL Oral TID BM   lactulose  30 g Oral TID   levothyroxine  75 mcg Oral Q0600   midodrine  15 mg Oral TID WC   mouth rinse  15 mL Mouth Rinse 4 times per day   Continuous Infusions:      LOS: 12 days   Hughie Closs, MD [facility]  03/09/2023, 10:19 AM   *Please note that this is a verbal dictation therefore any spelling or grammatical errors are due to the "Dragon Medical One" system interpretation.  Please page via Amion and do not message via secure chat for urgent patient care matters. Secure chat can be used for non urgent patient care matters.  How to contact the [facility] Attending or Consulting provider 7A - 7P or covering provider during after hours 7P -7A, for this patient?  Check the care team in [facility] and look for a) attending/consulting TRH provider listed and b) the [facility] team listed. Page or secure chat 7A-7P. Log into [email or web address] and use [facility] universal password to access. If you do not have the password, please contact the hospital operator. Locate the [facility] provider you are looking for under [facility] and page to a number that you can be directly reached. If you still have difficulty reaching the provider, please page the [facility] (Director on Call) for the Hospitalists listed on amion for assistance.

## 2023-03-09 NOTE — Progress Notes (Signed)
Physical Therapy Treatment Patient Details Name: [name] MRN: [id number] DOB: November 16, 1951 Today's Date: 03/09/2023   History of Present Illness Patient is 72 year old female admitted on 02/25/23 with acute hepatic encephalopathy. Pt with fall in March 2024 that resulted in L wrist fx.  She was on opioids which caused constipation and even with increased lactulose she was not having bowel movements and developed encephalopathy and hypothermia.  Pt with hx including but not limited to colon CA, liver cirrhosis, TIPS, hepatic encephalopathy.    PT Comments    Pt tolerated treatment well today. Pt was initially limited by low BP this AM however after medication pt was able to progress ambulation in the hallway this afternoon. Pt continues to demonstrate some balance deficits during ambulation continuing to make her a high fall risk. No change in DC/DME recs at this time. PT will continue to follow.   Recommendations for follow up therapy are one component of a multi-disciplinary discharge planning process, led by the attending physician.  Recommendations may be updated based on patient status, additional functional criteria and insurance authorization.  Follow Up Recommendations  Can patient physically be transported by private vehicle: Yes    Assistance Recommended at Discharge Intermittent Supervision/Assistance  Patient can return home with the following A little help with walking and/or transfers;A little help with bathing/dressing/bathroom;Assistance with cooking/housework;Assist for transportation;Help with stairs or ramp for entrance;Direct supervision/assist for medications management;Direct supervision/assist for financial management   Equipment Recommendations  Other (comment) (per accepting facility)    Recommendations for Other Services       Precautions / Restrictions Precautions Precautions: Fall Restrictions Weight Bearing Restrictions: Yes LUE Weight Bearing: Non weight  bearing     Mobility  Bed Mobility               General bed mobility comments: Pt up in chair    Transfers Overall transfer level: Needs assistance Equipment used: 1 person hand held assist Transfers: Sit to/from Stand Sit to Stand: Min assist                Ambulation/Gait Ambulation/Gait assistance: Min guard Gait Distance (Feet): 1200 Feet Assistive device: 1 person hand held assist, None Gait Pattern/deviations: Step-through pattern, Decreased stride length, Narrow base of support, Drifts right/left, Staggering right, Staggering left Gait velocity: decreased     General Gait Details: Pt with continued slow cautious gait. Pt had a few minor LOB however was able to self correct. Pt was able to complete 2 laps in hallway. 1 lap requiring 1 person HHA and 2nd lap no AD min guard.   Stairs             Wheelchair Mobility    Modified Rankin (Stroke Patients Only)       Balance Overall balance assessment: Needs assistance Sitting-balance support: Feet supported Sitting balance-Leahy Scale: Good     Standing balance support: Single extremity supported, During functional activity Standing balance-Leahy Scale: Fair Standing balance comment: Pt with few minor LOB in hallway however mostly able to self correct.                            Cognition Arousal/Alertness: Awake/alert Behavior During Therapy: WFL for tasks assessed/performed Overall Cognitive Status: Within Functional Limits for tasks assessed  Exercises      General Comments General comments (skin integrity, edema, etc.): VSS on RA. BP: 114/45 seated      Pertinent Vitals/Pain Pain Assessment Pain Assessment: No/denies pain    Home Living                          Prior Function            PT Goals (current goals can now be found in the care plan section) Progress towards PT goals: Progressing  toward goals    Frequency    Min 1X/week      PT Plan Current plan remains appropriate    Co-evaluation              AM-PAC PT "6 Clicks" Mobility   Outcome Measure  Help needed turning from your back to your side while in a flat bed without using bedrails?: A Little Help needed moving from lying on your back to sitting on the side of a flat bed without using bedrails?: A Little Help needed moving to and from a bed to a chair (including a wheelchair)?: A Little Help needed standing up from a chair using your arms (e.g., wheelchair or bedside chair)?: A Little Help needed to walk in hospital room?: A Little Help needed climbing 3-5 steps with a railing? : A Lot 6 Click Score: 17    End of Session         PT Visit Diagnosis: Other abnormalities of gait and mobility (R26.89);Muscle weakness (generalized) (M62.81)     Time: 4098-1191 PT Time Calculation (min) (ACUTE ONLY): 20 min  Charges:  $Gait Training: 8-22 mins                     Shela Nevin, PT, DPT Acute Rehab Services [phone]    [name] 03/09/2023, 4:08 PM

## 2023-03-09 NOTE — Plan of Care (Signed)
  Problem: Health Behavior/Discharge Planning: Goal: Ability to manage health-related needs will improve Outcome: Progressing   Problem: Clinical Measurements: Goal: Ability to maintain clinical measurements within normal limits will improve Outcome: Progressing   Problem: Activity: Goal: Risk for activity intolerance will decrease Outcome: Progressing   

## 2023-03-09 NOTE — Progress Notes (Signed)
   03/09/23 0852  Vitals  BP (!) 81/40  MAP (mmHg) (!) 52  BP Location Right Arm  BP Method Automatic  Patient Position (if appropriate) Lying  MEWS COLOR  MEWS Score Color Green  Pain Assessment  Pain Scale 0-10  Pain Score 0  MEWS Score  MEWS Temp 0  MEWS Systolic 1  MEWS Pulse 0  MEWS RR 0  MEWS LOC 0  MEWS Score 1   RN assisted patient from [facility] back to bed post BM. RN obtained AM VS and noted 81/40 as stated above. Patient is 0/10 pain and no dizziness, lying in bed. RN assigned q30 mins BP to be obtained and RN adminstered PO midodrine. Pahwani MD at bedside and made aware. Patient and patient's husband made aware of BP and BP frequency, RN recommended patient to maintain in bed. PT and Mobility made aware. RN made patient aware to notify if signs of dizziness. Call bell within reach.

## 2023-03-09 NOTE — Progress Notes (Signed)
PT Cancellation Note  Patient Details Name: [name] MRN: [id number] DOB: [birth year]   Cancelled Treatment:    Reason Eval/Treat Not Completed: Medical issues which prohibited therapy (Checked in with RN who reported that pt BP has been running in the 70/30 range this AM. Asked PT to hold for now while meds and fluids are given. Will try again later if time allows.)   [name] 03/09/2023, 9:35 AM

## 2023-03-10 ENCOUNTER — Ambulatory Visit: Payer: Medicare Other | Admitting: Nurse Practitioner

## 2023-03-10 DIAGNOSIS — K7682 Hepatic encephalopathy: Secondary | ICD-10-CM | POA: Diagnosis not present

## 2023-03-10 LAB — GLUCOSE, CAPILLARY
Glucose-Capillary: 103 mg/dL — ABNORMAL HIGH (ref 70–99)
Glucose-Capillary: 78 mg/dL (ref 70–99)
Glucose-Capillary: 85 mg/dL (ref 70–99)
Glucose-Capillary: 86 mg/dL (ref 70–99)
Glucose-Capillary: 92 mg/dL (ref 70–99)

## 2023-03-10 NOTE — Progress Notes (Signed)
PROGRESS NOTE    [name]  LXB:[id number] DOB: 14-Mar-1951 DOA: 02/25/2023 PCP: Eden Emms, NP   Brief Narrative:  72 year old female with history of colon cancer thousand +3, status post partial colectomy and chemotherapy, history of liver cirrhosis felt secondary to previous chemotherapy with oxaliplatin, s/p TIPS procedure at [facility] in 2012, hepatic encephalopathy, came to the hospital with confusion for past 2 days. As per patient has been patient broke her left wrist in March at that time she was started on opioid-based analgesics which caused constipation.  Patient's dose of lactulose was increased from 45 cc 3 times a day to 60 cc 3 times a day. Despite this patient was not having bowel movements, she has been incontinent of urine and stools. Patient has been more altered with nausea vomiting for past 2 days.  Missed dose of lactulose.  Was found to have temperature of 94 F at home. In the ED she was found to be lethargic and hypothermic.  Bair hugger was applied and hypothermia resolved. UA was positive for pyuria, ammonia was elevated at 163.    Assessment & Plan:   Principal Problem:   Acute hepatic encephalopathy Active Problems:   Decompensated liver cirrhosis with portal HTN and gastric varices   AKI (acute kidney injury)   Hypothyroidism   Hypertension   Hx of colon cancer, stage III   Bradycardia   Anemia   Acute cystitis with hematuria   Protein-calorie malnutrition, severe  Acute hepatic encephalopathy -Ammonia level elevated at 163 on admission which improved with lactulose.  Due to excessive number of loose bowel movements, lactulose dose was reduced to every 8 hours on 03/07/2023. Patient was started on Xifaxan during this hospitalization.  Husband tells me that his co-pay for that medication is going to be $1200 and he will not be able to afford it and he prefers for me to discontinue that medication here and see how she does on lactulose alone.  xifaxan discontinued on 03/09/2023.  She is having 2-3 bowel movements a day.  Patient remains alert and oriented.   Hypoglycemia: Better.  She is eating better now.   Hypothermia: Secondary to acute hepatic encephalopathy.  Now resolved.   Acute kidney injury: Resolved.    Hypotension: Blood pressure low intermittently.  She is on maximum therapy.  Continue midodrine 15 mg p.o. 3 times daily.   Liver cirrhosis with portal hypertension s/p TIPS procedure in 2012 -Status post TIPS procedure in 2012. Developed liver cirrhosis after chemotherapy with oxaliplatin -MELD score 15, 6% estimated 38-month mortality    UTI ruled out: Antibiotics discontinued.  Hypothyroidism: Continue Synthroid.  Pancytopenia: Likely chemotherapy-induced.  Stable.  SVT episodes with bradycardia, new : While at [facility] long, patient had episodes of bradycardia with pauses, HR lowest 34, given atropine 1mg  x1.  After discussing with cardiology as well as PCCM, cardiology recommended transferring to ICU at [facility] for possible pacing.  However since arrival to [facility], patient's rhythm has mainly been normal sinus rhythm so cardiology has now recommended supportive care and has signed off.  Patient is asymptomatic.  She is bradycardic.   Hx of colon cancer, stage III - status post partial colectomy and chemotherapy, history of liver cirrhosis felt secondary to previous chemotherapy with oxaliplatin, s/p TIPS procedure   Severe protein calorie malnutrition: RD on board.   Disposition: Patient has been seen by PT OT and they recommended CIR. insurance denied CIR, family appealed, appeal was denied as well, husband is  now interested in SNF.  TOC has been made aware.    DVT prophylaxis: enoxaparin (LOVENOX) injection 40 mg Start: 03/02/23 2000 SCDs Start: 02/26/23 0035   Code Status: Full Code  Family Communication: Husband present at side.  Plan of care discussed with both of them.  Status is: Inpatient Remains  inpatient appropriate because: Medically stable pending placement to SNF.   Estimated body mass index is 25.76 kg/m as calculated from the following:   Height as of this encounter:  (1.626 m).   Weight as of this encounter: 68.1 kg.    Nutritional Assessment: Body mass index is 25.76 kg/m.Marland Kitchen Seen by dietician.  I agree with the assessment and plan as outlined below: Nutrition Status: Nutrition Problem: Severe Malnutrition Etiology: acute illness Signs/Symptoms: moderate fat depletion, moderate muscle depletion, energy intake < or equal to 50% for > or equal to 5 days, percent weight loss (14.6% in 3 weeks) Percent weight loss: 14.6 % (in 3 weeks) Interventions: Refer to RD note for recommendations  . Skin Assessment: I have examined the patient's skin and I agree with the wound assessment as performed by the wound care RN as outlined below:    Consultants:  GI  Procedures:  None  Antimicrobials:  Anti-infectives (From admission, onward)    Start     Dose/Rate Route Frequency Ordered Stop   03/02/23 1200  rifaximin (XIFAXAN) tablet 550 mg  Status:  Discontinued        550 mg Oral 2 times daily 03/02/23 1039 03/09/23 0911   02/26/23 2200  cefTRIAXone (ROCEPHIN) 2 g in sodium chloride 0.9 % 100 mL IVPB  Status:  Discontinued        2 g 200 mL/hr over 30 Minutes Intravenous Every 24 hours 02/26/23 0034 02/27/23 0953   02/25/23 2200  cefTRIAXone (ROCEPHIN) 1 g in sodium chloride 0.9 % 100 mL IVPB  Status:  Discontinued        1 g 200 mL/hr over 30 Minutes Intravenous Every 24 hours 02/25/23 2118 02/26/23 0034         Subjective: Seen and examined.  She has no complaints.  Husband at the bedside.  Objective: Vitals:   03/10/23 0417 03/10/23 0711 03/10/23 0854 03/10/23 1114  BP: (!) 95/42 (!) 89/51 (!) 110/45 (!) 94/46  Pulse: (!) 51 (!) 54 (!) 48 (!) 45  Resp: Temp: 98 F (36.7 C) (!) 97.4 F (36.3 C)  (!) 97.4 F (36.3 C)  TempSrc: Oral Oral   Oral  SpO2: 98% 99% 98% 98%  Weight: 68.1 kg     Height:        Intake/Output Summary (Last 24 hours) at 03/10/2023 1149 Last data filed at 03/10/2023 0848 Gross per 24 hour  Intake 480 ml  Output 750 ml  Net -270 ml    Filed Weights   03/07/23 0325 03/08/23 0413 03/10/23 0417  Weight: 65.6 kg 67.1 kg 68.1 kg    Examination: General exam: Appears calm and comfortable  Respiratory system: Clear to auscultation. Respiratory effort normal. Cardiovascular system: S1 & S2 heard, RRR. No JVD, murmurs, rubs, gallops or clicks. No pedal edema. Gastrointestinal system: Abdomen is nondistended, soft and nontender. No organomegaly or masses felt. Normal bowel sounds heard. Central nervous system: Alert and oriented. No focal neurological deficits. Extremities: Symmetric 5 x 5 power. Skin: No rashes, lesions or ulcers.  Psychiatry: Judgement and insight appear normal. Mood & affect appropriate.    Data Reviewed: I have  personally reviewed following labs and imaging studies  CBC: Recent Labs  Lab 03/07/23 0832  WBC 3.4*  NEUTROABS 1.6*  HGB 9.7*  HCT 28.8*  MCV 105.9*  PLT 203    Basic Metabolic Panel: Recent Labs  Lab 03/03/23 1635 03/04/23 0541 03/04/23 1739 03/05/23 0432 03/07/23 0832  NA  --  138  --   --  136  K  --  4.0  --   --  4.3  CL  --  109  --   --  107  CO2  --  20*  --   --  21*  GLUCOSE  --  98  --   --  117*  BUN  --  20  --   --  15  CREATININE  --  0.72  --   --  0.67  CALCIUM  --  9.1  --   --  9.0  MG 1.8 1.8 1.9 1.8  --   PHOS 3.0 3.0 3.3 3.0  --     GFR: Estimated Creatinine Clearance: 61.2 mL/min (by C-G formula based on SCr of 0.67 mg/dL). Liver Function Tests: Recent Labs  Lab 03/04/23 0541 03/07/23 0832  AST 44* 69*  ALT 26 45*  ALKPHOS 201* 218*  BILITOT 2.5* 1.5*  PROT 6.2* 5.8*  ALBUMIN 3.2* 2.8*    No results for input(s): "LIPASE", "AMYLASE" in the last 168 hours. Recent Labs  Lab 03/04/23 0541  AMMONIA 44*     Coagulation Profile: No results for input(s): "INR", "PROTIME" in the last 168 hours.  Cardiac Enzymes: No results for input(s): "CKTOTAL", "CKMB", "CKMBINDEX", "TROPONINI" in the last 168 hours. BNP (last 3 results) No results for input(s): "PROBNP" in the last 8760 hours. HbA1C: No results for input(s): "HGBA1C" in the last 72 hours. CBG: Recent Labs  Lab 03/09/23 1549 03/09/23 2001 03/10/23 0006 03/10/23 0420 03/10/23 0856  GLUCAP 107* 85 85 78 103*    Lipid Profile: No results for input(s): "CHOL", "HDL", "LDLCALC", "TRIG", "CHOLHDL", "LDLDIRECT" in the last 72 hours. Thyroid Function Tests: No results for input(s): "TSH", "T4TOTAL", "FREET4", "T3FREE", "THYROIDAB" in the last 72 hours.  Anemia Panel: No results for input(s): "VITAMINB12", "FOLATE", "FERRITIN", "TIBC", "IRON", "RETICCTPCT" in the last 72 hours. Sepsis Labs: No results for input(s): "PROCALCITON", "LATICACIDVEN" in the last 168 hours.   Recent Results (from the past 240 hour(s))  MRSA Next Gen by PCR, Nasal     Status: None   Collection Time: 03/02/23  7:40 PM   Specimen: Nasal Mucosa; Nasal Swab  Result Value Ref Range Status   MRSA by PCR Next Gen NOT DETECTED NOT DETECTED Final    Comment: (NOTE) The GeneXpert MRSA Assay (FDA approved for NASAL specimens only), is one component of a comprehensive MRSA colonization surveillance program. It is not intended to diagnose MRSA infection nor to guide or monitor treatment for MRSA infections. Test performance is not FDA approved in patients less than 21 years old. Performed at [facility], [address], [place], [place] [address]      Radiology Studies: No results found.  Scheduled Meds:  (feeding supplement) PROSource Plus  30 mL Oral TID BM   enoxaparin (LOVENOX) injection  40 mg Subcutaneous Q24H   feeding supplement  237 mL Oral TID BM   lactulose  30 g Oral TID   levothyroxine  75 mcg Oral Q0600   midodrine  15 mg Oral TID WC    mouth rinse  15 mL Mouth  Rinse 4 times per day   Continuous Infusions:      LOS: 13 days   Hughie Closs, MD [facility]  03/10/2023, 11:49 AM   *Please note that this is a verbal dictation therefore any spelling or grammatical errors are due to the "Dragon Medical One" system interpretation.  Please page via Amion and do not message via secure chat for urgent patient care matters. Secure chat can be used for non urgent patient care matters.  How to contact the [facility] Attending or Consulting provider 7A - 7P or covering provider during after hours 7P -7A, for this patient?  Check the care team in [facility] and look for a) attending/consulting TRH provider listed and b) the [facility] team listed. Page or secure chat 7A-7P. Log into [email or web address] and use [place]'s universal password to access. If you do not have the password, please contact the hospital operator. Locate the [facility] provider you are looking for under [facility] and page to a number that you can be directly reached. If you still have difficulty reaching the provider, please page the [facility] (Director on Call) for the Hospitalists listed on amion for assistance.

## 2023-03-10 NOTE — TOC Progression Note (Signed)
Transition of Care [facility]) - Progression Note    Patient Details  Name: [name] MRN: [id number] Date of Birth: [birth year]  Transition of [facility]) CM/SW Contact  Leander Rams, LCSW Phone Number: 03/10/2023, 3:16 PM  Clinical Narrative:    CSW met with pt at bedside to discuss potential dc to STR at [facility]. Pt is agreeable to go and requested [facility]. If [facility] is not able to offer a bed, pt is agreeable to go to  SNF in [place]. Pt husband later joined CSW and pt at bedside. Pt husband inquired about out patient PT, CSW confirmed that was an option. Pt and husband are interested in doing outpatient PT once STR is completed.   CSW completed fl2 and faxed out. TOC will continue to follow this admission.    Expected Discharge Plan: IP Rehab Facility Barriers to Discharge: Continued Medical Work up  Expected Discharge Plan and Services In-house Referral: NA Discharge Planning Services: CM Consult Post Acute Care Choice:  (unknown) Living arrangements for the past 2 months: Single Family Home                 DME Arranged: N/A DME Agency: NA       HH Arranged: NA HH Agency: NA         Social Determinants of Health (SDOH) Interventions SDOH Screenings   Food Insecurity: No Food Insecurity (02/26/2023)  Housing: Low Risk  (02/26/2023)  Transportation Needs: No Transportation Needs (02/26/2023)  Utilities: Not At Risk (02/26/2023)  Alcohol Screen: Low Risk  (04/01/2022)  Depression (PHQ2-9): Low Risk  (02/24/2023)  Financial Resource Strain: Low Risk  (04/01/2022)  Physical Activity: Insufficiently Active (04/01/2022)  Social Connections: Moderately Integrated (04/01/2022)  Stress: No Stress Concern Present (04/01/2022)  Tobacco Use: Low Risk  (02/25/2023)    Readmission Risk Interventions    03/02/2023    3:19 PM  Readmission Risk Prevention Plan  Transportation Screening Complete  PCP or Specialist Appt within 3-5 Days Complete  HRI or Home Care Consult  Complete  Social Work Consult for Recovery Care Planning/Counseling Complete  Medication Review Oceanographer) Complete   Oletta Lamas, MSW, LCSWA, LCASA Transitions of Care  Clinical Social Worker I

## 2023-03-10 NOTE — Progress Notes (Signed)
Inpatient Rehab Admissions Coordinator:   Received a denial from [facility] Medicare for appeal for CIR.  I let TOC and MD know.  I will stop by to meet with pt at bedside and update later today.   Estill Dooms, PT, DPT Admissions Coordinator [phone] 03/10/23  10:06 AM

## 2023-03-10 NOTE — Plan of Care (Signed)
  Problem: Education: Goal: Knowledge of General Education information will improve Description: Including pain rating scale, medication(s)/side effects and non-pharmacologic comfort measures Outcome: Progressing   Problem: Clinical Measurements: Goal: Ability to maintain clinical measurements within normal limits will improve Outcome: Progressing Goal: Cardiovascular complication will be avoided Outcome: Progressing   

## 2023-03-10 NOTE — Progress Notes (Signed)
Occupational Therapy Treatment Patient Details Name: [name] MRN: [id number] DOB: 1951/07/25 Today's Date: 03/10/2023   History of present illness Patient is 72 year old female admitted on 02/25/23 with acute hepatic encephalopathy. Pt with fall in March 2024 that resulted in L wrist fx.  She was on opioids which caused constipation and even with increased lactulose she was not having bowel movements and developed encephalopathy and hypothermia.  Pt with hx including but not limited to colon CA, liver cirrhosis, TIPS, hepatic encephalopathy.   OT comments  Pt making good progress with functional goals. Pt sat EOB without physical assist, min guard HHA sit - stand and to walk to bathroom, clothing mgt min guard A , transfer to toilet min guard A, Sup with anterior hygiene. Pt stood at sink with Sup for oral care and walked to sit in recliner min guard A. Pt's husband present and aware of [employer] insurance appeal denial and states that he would like pt to go to [facility] for short term rehab. Pt and husband asked many questions about SNF rehab and this OT educated them on level of therapy offered and pt participation that is expected in order to maximize level of function and safety to return home. Pt and her husband verbalized understanding   Recommendations for follow up therapy are one component of a multi-disciplinary discharge planning process, led by the attending physician.  Recommendations may be updated based on patient status, additional functional criteria and insurance authorization.    Assistance Recommended at Discharge Frequent or constant Supervision/Assistance  Patient can return home with the following  Assistance with cooking/housework;Assist for transportation;Help with stairs or ramp for entrance;A little help with bathing/dressing/bathroom;A little help with walking and/or transfers   Equipment Recommendations  Other (comment) (TBD at next venue of care)    Recommendations  for Other Services      Precautions / Restrictions Precautions Precautions: Fall Required Braces or Orthoses: Splint/Cast Splint/Cast: L wrist/forearm Restrictions Weight Bearing Restrictions: Yes LUE Weight Bearing: Non weight bearing       Mobility Bed Mobility Overal bed mobility: Needs Assistance       Supine to sit: Supervision          Transfers Overall transfer level: Needs assistance Equipment used: 1 person hand held assist Transfers: Sit to/from Stand Sit to Stand: Min guard           General transfer comment: HHA to walk to bathroom for toileting, stood at sink for groming, oral hygiene     Balance Overall balance assessment: Needs assistance   Sitting balance-Leahy Scale: Good     Standing balance support: Single extremity supported, During functional activity Standing balance-Leahy Scale: Fair                             ADL either performed or assessed with clinical judgement   ADL Overall ADL's : Needs assistance/impaired     Grooming: Standing;Supervision/safety;Oral care;Wash/dry face;Wash/dry Lawyer: Min guard;Ambulation;Regular Social worker and Hygiene: Min guard;Sit to/from stand       Functional mobility during ADLs: Min guard General ADL Comments: HHA min guard A to walk to bathroom for toileting and standing at sink for grooming/hygiene    Extremity/Trunk Assessment Upper Extremity Assessment Upper Extremity Assessment: Generalized weakness LUE Deficits / Details: cast on  L wrist, able to wiggle digits,  NWB   Lower Extremity Assessment Lower Extremity Assessment: Defer to PT evaluation   Cervical / Trunk Assessment Cervical / Trunk Assessment: Normal    Vision Ability to See in Adequate Light: 0 Adequate Patient Visual Report: No change from baseline     Perception     Praxis      Cognition Arousal/Alertness:  Awake/alert Behavior During Therapy: WFL for tasks assessed/performed Overall Cognitive Status: Within Functional Limits for tasks assessed                                          Exercises      Shoulder Instructions       General Comments      Pertinent Vitals/ Pain       Pain Assessment Pain Assessment: No/denies pain Faces Pain Scale: No hurt  Home Living                                          Prior Functioning/Environment              Frequency  Min 2X/week        Progress Toward Goals  OT Goals(current goals can now be found in the care plan section)  Progress towards OT goals: Progressing toward goals     Plan Discharge plan remains appropriate;Frequency remains appropriate    Co-evaluation                 AM-PAC OT "6 Clicks" Daily Activity     Outcome Measure   Help from another person eating meals?: A Little Help from another person taking care of personal grooming?: A Little Help from another person toileting, which includes using toliet, bedpan, or urinal?: A Little Help from another person bathing (including washing, rinsing, drying)?: A Little Help from another person to put on and taking off regular upper body clothing?: None Help from another person to put on and taking off regular lower body clothing?: A Little 6 Click Score: 19    End of Session Equipment Utilized During Treatment: Gait belt  OT Visit Diagnosis: Unsteadiness on feet (R26.81);Other abnormalities of gait and mobility (R26.89);Muscle weakness (generalized) (M62.81)   Activity Tolerance Patient tolerated treatment well   Patient Left in chair;with call bell/phone within reach;with family/visitor present   Nurse Communication          Time: 3888-2800 OT Time Calculation (min): 26 min  Charges: OT General Charges $OT Visit: 1 Visit OT Treatments $Self Care/Home Management : 8-22 mins $Therapeutic Activity: 8-22  mins    Galen Manila 03/10/2023, 2:53 PM

## 2023-03-10 NOTE — Progress Notes (Signed)
Physical Therapy Treatment Patient Details Name: [name] MRN: [id number] DOB: [birth year] Today's Date: 03/10/2023   History of Present Illness Patient is 72 year old female admitted on 02/25/23 with acute hepatic encephalopathy. Pt with fall in March 2024 that resulted in L wrist fx.  She was on opioids which caused constipation and even with increased lactulose she was not having bowel movements and developed encephalopathy and hypothermia.  Pt with hx including but not limited to colon CA, liver cirrhosis, TIPS, hepatic encephalopathy.    PT Comments    Pt tolerated treatment well today. Pt was able to navigate stairs and complete DGI in hallway today. Pt scored 11/24 on the DGI today indicating that she is a high fall risk. Pt would benefit from further rehab post acutely to improve balance and functional independence as pt PLOF was independent prior to admission. No change in DC/DME recs at this time. Pt would benefit from higher level balance training during acute stay.  Recommendations for follow up therapy are one component of a multi-disciplinary discharge planning process, led by the attending physician.  Recommendations may be updated based on patient status, additional functional criteria and insurance authorization.  Follow Up Recommendations  Can patient physically be transported by private vehicle: Yes    Assistance Recommended at Discharge Intermittent Supervision/Assistance  Patient can return home with the following A little help with walking and/or transfers;A little help with bathing/dressing/bathroom;Assistance with cooking/housework;Assist for transportation;Help with stairs or ramp for entrance;Direct supervision/assist for medications management;Direct supervision/assist for financial management   Equipment Recommendations  Other (comment) (Per accepting facility)    Recommendations for Other Services       Precautions / Restrictions Precautions Precautions:  Fall Restrictions Weight Bearing Restrictions: Yes LUE Weight Bearing: Non weight bearing     Mobility  Bed Mobility Overal bed mobility: Needs Assistance Bed Mobility: Supine to Sit, Sit to Supine     Supine to sit: Supervision Sit to supine: Supervision        Transfers Overall transfer level: Needs assistance Equipment used: 1 person hand held assist Transfers: Sit to/from Stand Sit to Stand: Min guard                Ambulation/Gait Ambulation/Gait assistance: Min guard Gait Distance (Feet): 1500 Feet Assistive device: 1 person hand held assist, None Gait Pattern/deviations: Step-through pattern, Decreased stride length, Narrow base of support, Drifts right/left, Staggering right, Staggering left Gait velocity: decreased     General Gait Details: Pt with continued slowed gait and a few minor LOB during DGI.   Stairs Stairs: Yes Stairs assistance: Min guard Stair Management: One rail Right, Forwards, Step to pattern Number of Stairs: 10 General stair comments: no LOB noted   Wheelchair Mobility    Modified Rankin (Stroke Patients Only)       Balance Overall balance assessment: Needs assistance Sitting-balance support: Feet supported Sitting balance-Leahy Scale: Good     Standing balance support: Single extremity supported, During functional activity Standing balance-Leahy Scale: Fair Standing balance comment: Pt with few minor LOB in hallway however mostly able to self correct.                 Standardized Balance Assessment Standardized Balance Assessment : Dynamic Gait Index   Dynamic Gait Index Level Surface: Mild Impairment Change in Gait Speed: Moderate Impairment Gait with Horizontal Head Turns: Moderate Impairment Gait with Vertical Head Turns: Moderate Impairment Gait and Pivot Turn: Mild Impairment Step Over Obstacle: Moderate Impairment Step Around Obstacles:  Mild Impairment Steps: Moderate Impairment Total Score: 11       Cognition Arousal/Alertness: Awake/alert Behavior During Therapy: WFL for tasks assessed/performed Overall Cognitive Status: Within Functional Limits for tasks assessed                                          Exercises      General Comments General comments (skin integrity, edema, etc.): VSS on RA      Pertinent Vitals/Pain Pain Assessment Pain Assessment: No/denies pain    Home Living                          Prior Function            PT Goals (current goals can now be found in the care plan section) Progress towards PT goals: Progressing toward goals    Frequency    Min 1X/week      PT Plan Current plan remains appropriate    Co-evaluation              AM-PAC PT "6 Clicks" Mobility   Outcome Measure  Help needed turning from your back to your side while in a flat bed without using bedrails?: A Little Help needed moving from lying on your back to sitting on the side of a flat bed without using bedrails?: A Little Help needed moving to and from a bed to a chair (including a wheelchair)?: A Little Help needed standing up from a chair using your arms (e.g., wheelchair or bedside chair)?: A Little Help needed to walk in hospital room?: A Little Help needed climbing 3-5 steps with a railing? : A Little 6 Click Score: 18    End of Session         PT Visit Diagnosis: Other abnormalities of gait and mobility (R26.89);Muscle weakness (generalized) (M62.81)     Time: 1610-9604 PT Time Calculation (min) (ACUTE ONLY): 24 min  Charges:  $Gait Training: 23-37 mins                     Shela Nevin, PT, DPT Acute Rehab Services [phone]    [name] 03/10/2023, 9:46 AM

## 2023-03-10 NOTE — NC FL2 (Signed)
[place] MEDICAID FL2 LEVEL OF CARE FORM     IDENTIFICATION  Patient Name: [name] Birthdate: November 05, 1951 Sex: female Admission Date (Current Location): 02/25/2023  [facility] and [place] Number:  Producer, television/film/video and Address:  [facility], [address] [place], [place] [address]      Provider Number: [id number]  Attending Physician Name and Address:  Hughie Closs, MD  Relative Name and Phone Number:       Current Level of Care: Hospital Recommended Level of Care: Skilled Nursing Facility Prior Approval Number:    Date Approved/Denied:   PASRR Number: [id number]  Discharge Plan: SNF    Current Diagnoses: Patient Active Problem List   Diagnosis Date Noted   Protein-calorie malnutrition, severe 02/26/2023   Acute hepatic encephalopathy 02/25/2023   Weight loss 02/24/2023   Bowel habit changes 02/24/2023   Generalized abdominal pain 02/24/2023   Hospital discharge follow-up 02/24/2023   Fatigue 01/04/2023   Confusion 08/26/2022   Acute cystitis with hematuria 08/26/2022   Frequent UTI 05/18/2022   Anemia 05/18/2022   Bacteremia 05/04/2022   Hypernatremia 05/04/2022   AKI (acute kidney injury) 04/30/2022   Bradycardia 02/05/2022   Hepatic encephalopathy (HCC) 02/03/2022   Basal cell carcinoma (BCC) 05/06/2021   Cecal cancer 05/06/2021   Hypertension 05/06/2021   COVID-19 vaccination declined 01/30/2021   Decompensated liver cirrhosis with portal HTN and gastric varices 12/04/2020   Essential hypertension 08/12/2020   Murmur, cardiac 03/19/2020   Chronic pain of both knees 01/29/2020   Hypothyroidism 06/30/2019   History of basal cell cancer 06/30/2019   Hx of colon cancer, stage III 11/30/2011   Esophageal varices 06/12/2010   Portal hypertension 01/23/2008    Orientation RESPIRATION BLADDER Height & Weight     Self, Time, Situation, Place  Normal Continent Weight: 150 lb 1.6 oz (68.1 kg) Height:  5\' 4"  (162.6 cm)   BEHAVIORAL SYMPTOMS/MOOD NEUROLOGICAL BOWEL NUTRITION STATUS      Continent Diet (See dc summary)  AMBULATORY STATUS COMMUNICATION OF NEEDS Skin   Limited Assist Verbally Normal                       Personal Care Assistance Level of Assistance  Bathing, Feeding, Dressing Bathing Assistance: Limited assistance Feeding assistance: Independent Dressing Assistance: Limited assistance     Functional Limitations Info  Sight, Hearing, Speech Sight Info: Adequate Hearing Info: Adequate Speech Info: Adequate    SPECIAL CARE FACTORS FREQUENCY  PT (By licensed PT), OT (By licensed OT)     PT Frequency: 5xweek OT Frequency: 5xweek            Contractures Contractures Info: Not present    Additional Factors Info  Code Status, Allergies Code Status Info: Full Allergies Info: Contrast Media (Iodinated Contrast Media)           Current Medications (03/10/2023):  This is the current hospital active medication list Current Facility-Administered Medications  Medication Dose Route Frequency Provider Last Rate Last Admin   (feeding supplement) PROSource Plus liquid 30 mL  30 mL Oral TID BM Pahwani, Ravi, MD   30 mL at 03/10/23 0856   enoxaparin (LOVENOX) injection 40 mg  40 mg Subcutaneous Q24H Cheri Fowler, MD   40 mg at 03/09/23 2101   feeding supplement (ENSURE ENLIVE / ENSURE PLUS) liquid 237 mL  237 mL Oral TID BM Rai, Ripudeep K, MD   237 mL at 03/08/23 1526   lactulose (CHRONULAC) 10  GM/15ML solution 30 g  30 g Oral TID Hughie Closs, MD   30 g at 03/10/23 0856   levothyroxine (SYNTHROID) tablet 75 mcg  75 mcg Oral Q0600 Hughie Closs, MD   75 mcg at 03/10/23 4098   lip balm (CARMEX) ointment 1 Application  1 Application Topical PRN Rai, Ripudeep K, MD       liver oil-zinc oxide (DESITIN) 40 % ointment   Topical Daily PRN Rai, Ripudeep K, MD   1 Application at 02/26/23 1826   midodrine (PROAMATINE) tablet 15 mg  15 mg Oral TID WC John Giovanni, MD   15 mg at 03/10/23  1209   ondansetron (ZOFRAN) injection 4 mg  4 mg Intravenous Q6H PRN Rai, Ripudeep K, MD       Oral care mouth rinse  15 mL Mouth Rinse 4 times per day Rai, Ripudeep K, MD   15 mL at 03/08/23 2115   Oral care mouth rinse  15 mL Mouth Rinse PRN Rai, Ripudeep K, MD       polyethylene glycol (MIRALAX / GLYCOLAX) packet 17 g  17 g Oral Daily PRN Hughie Closs, MD         Discharge Medications: Please see discharge summary for a list of discharge medications.  Relevant Imaging Results:  Relevant Lab Results:   Additional Information SSN: [id number]  Oletta Lamas, MSW, [facility] Transitions of Care  Clinical Social Worker I

## 2023-03-11 ENCOUNTER — Telehealth: Payer: Self-pay | Admitting: Nurse Practitioner

## 2023-03-11 DIAGNOSIS — N179 Acute kidney failure, unspecified: Secondary | ICD-10-CM | POA: Diagnosis not present

## 2023-03-11 DIAGNOSIS — K7682 Hepatic encephalopathy: Secondary | ICD-10-CM | POA: Diagnosis not present

## 2023-03-11 DIAGNOSIS — K7460 Unspecified cirrhosis of liver: Secondary | ICD-10-CM | POA: Diagnosis not present

## 2023-03-11 DIAGNOSIS — E039 Hypothyroidism, unspecified: Secondary | ICD-10-CM | POA: Diagnosis not present

## 2023-03-11 LAB — GLUCOSE, CAPILLARY
Glucose-Capillary: 105 mg/dL — ABNORMAL HIGH (ref 70–99)
Glucose-Capillary: 111 mg/dL — ABNORMAL HIGH (ref 70–99)
Glucose-Capillary: 58 mg/dL — ABNORMAL LOW (ref 70–99)
Glucose-Capillary: 63 mg/dL — ABNORMAL LOW (ref 70–99)
Glucose-Capillary: 76 mg/dL (ref 70–99)
Glucose-Capillary: 77 mg/dL (ref 70–99)
Glucose-Capillary: 79 mg/dL (ref 70–99)
Glucose-Capillary: 87 mg/dL (ref 70–99)

## 2023-03-11 LAB — CORTISOL: Cortisol, Plasma: 2.9 ug/dL

## 2023-03-11 MED ORDER — SODIUM CHLORIDE 0.9 % IV BOLUS
250.0000 mL | INTRAVENOUS | Status: AC
  Administered 2023-03-11: 250 mL via INTRAVENOUS

## 2023-03-11 MED ORDER — MIDODRINE HCL 5 MG PO TABS
10.0000 mg | ORAL_TABLET | Freq: Three times a day (TID) | ORAL | Status: DC
  Administered 2023-03-11 – 2023-03-12 (×2): 10 mg via ORAL
  Filled 2023-03-11 (×3): qty 2

## 2023-03-11 MED ORDER — ALBUMIN HUMAN 25 % IV SOLN
25.0000 g | Freq: Four times a day (QID) | INTRAVENOUS | Status: AC
  Administered 2023-03-11 – 2023-03-12 (×4): 25 g via INTRAVENOUS
  Filled 2023-03-11 (×4): qty 100

## 2023-03-11 MED ORDER — BOOST / RESOURCE BREEZE PO LIQD CUSTOM
1.0000 | Freq: Three times a day (TID) | ORAL | Status: DC
  Administered 2023-03-11 – 2023-03-13 (×5): 1 via ORAL

## 2023-03-11 MED ORDER — COSYNTROPIN 0.25 MG IJ SOLR
0.2500 mg | Freq: Once | INTRAMUSCULAR | Status: AC
  Administered 2023-03-12: 0.25 mg via INTRAVENOUS
  Filled 2023-03-11: qty 0.25

## 2023-03-11 MED ORDER — MIDODRINE HCL 5 MG PO TABS
20.0000 mg | ORAL_TABLET | ORAL | Status: AC
  Administered 2023-03-11: 20 mg via ORAL
  Filled 2023-03-11: qty 4

## 2023-03-11 NOTE — TOC Progression Note (Addendum)
Transition of Care [facility]) - Progression Note    Patient Details  Name: [name] MRN: [id number] Date of Birth: 01-17-1951  [facility]) CM/SW Contact  Leone Haven, RN Phone Number: 03/11/2023, 12:43 PM  Clinical Narrative:    NCM spoke with patient and spouse regarding OP PT, they would like NCM to make referral to OP Rehab on [facility] [address]  NCM made referral thru epic and it is on the AVS.   Expected Discharge Plan: IP Rehab Facility Barriers to Discharge: Continued Medical Work up  Expected Discharge Plan and Services In-house Referral: NA Discharge Planning Services: CM Consult Post Acute Care Choice:  (unknown) Living arrangements for the past 2 months: Single Family Home                 DME Arranged: N/A DME Agency: NA       HH Arranged: NA HH Agency: NA         Social Determinants of Health (SDOH) Interventions SDOH Screenings   Food Insecurity: No Food Insecurity (02/26/2023)  Housing: Low Risk  (02/26/2023)  Transportation Needs: No Transportation Needs (02/26/2023)  Utilities: Not At Risk (02/26/2023)  Alcohol Screen: Low Risk  (04/01/2022)  Depression (PHQ2-9): Low Risk  (02/24/2023)  Financial Resource Strain: Low Risk  (04/01/2022)  Physical Activity: Insufficiently Active (04/01/2022)  Social Connections: Moderately Integrated (04/01/2022)  Stress: No Stress Concern Present (04/01/2022)  Tobacco Use: Low Risk  (02/25/2023)    Readmission Risk Interventions    03/02/2023    3:19 PM  Readmission Risk Prevention Plan  Transportation Screening Complete  PCP or Specialist Appt within 3-5 Days Complete  HRI or Home Care Consult Complete  Social Work Consult for Recovery Care Planning/Counseling Complete  Medication Review Oceanographer) Complete

## 2023-03-11 NOTE — Progress Notes (Signed)
NT notified RN that patient's 1200 CBG is 63. RN rounded on patient, patient is walking with PT in the room. RN obtained VS and patient express no symptoms. NT provided 1 x 8ox of orange juice and patient's lunch tray has arrived. RN made patient and patient's husband aware of low blood glucose level and instructed for patient to start eating their meal. RN will re-round to assess after.  RN obtained repeat CBG and repeat CBG is 111 post lunch.

## 2023-03-11 NOTE — Progress Notes (Signed)
   03/11/23 1619  Vitals  BP (!) 94/29  MAP (mmHg) (!) 48  BP Location Right Arm  BP Method Automatic  Patient Position (if appropriate) Lying  Pulse Rate (!) 47  Pulse Rate Source Monitor  ECG Heart Rate (!) 46  Resp 16  Level of Consciousness  Level of Consciousness Alert  MEWS COLOR  MEWS Score Color Yellow  Oxygen Therapy  SpO2 100 %  O2 Device Room Air  MEWS Score  MEWS Temp 0  MEWS Systolic 1  MEWS Pulse 1  MEWS RR 0  MEWS LOC 0  MEWS Score 2   RN rounded to obtain 1600 VS and patient's VS as stated as before. RN unable to obtain oral temp so RN obtained rectal temp. Rectal temp is 95.1. RN notified MD on VS. Patient is asymptomatic. RN provided PO midodrine and provided warm blankets onto patient. Cortisol lab ordered.

## 2023-03-11 NOTE — Telephone Encounter (Signed)
Contacted [name] to schedule their annual wellness visit. Appointment made for 03/25/2023.  [facility] Care Guide [facility] AWV TEAM Direct Dial: [phone]

## 2023-03-11 NOTE — Progress Notes (Signed)
Mobility Specialist Progress Note:   03/11/23 0915  Mobility  Activity Ambulated with assistance in hallway  Level of Assistance Contact guard assist, steadying assist  Assistive Device None  Distance Ambulated (ft) 1000 ft  Activity Response Tolerated well  Mobility Referral Yes  $Mobility charge 1 Mobility   Pt eager for mobility session. Required no physical assistance, only minG for safety. Pt displayed minor unsteadiness, able to self-correct. Pt left with all needs met.  Addison Lank Mobility Specialist Please contact via SecureChat or  Rehab office at [phone]

## 2023-03-11 NOTE — Progress Notes (Signed)
Physical Therapy Treatment Patient Details Name: [name] MRN: [id number] DOB: Sep 02, 1951 Today's Date: 03/11/2023   History of Present Illness Patient is 72 year old female admitted on 02/25/23 with acute hepatic encephalopathy. Pt with fall in March 2024 that resulted in L wrist fx.  She was on opioids which caused constipation and even with increased lactulose she was not having bowel movements and developed encephalopathy and hypothermia.  Pt with hx including but not limited to colon CA, liver cirrhosis, TIPS, hepatic encephalopathy.    PT Comments    Pt tolerated treatment well today. Pt was able to ambulate in hallway at supervision/Min guard level. Pt continues to demonstrate balance deficits scoring 28/56 on the BERG balance indicating a high fall risk. Spoke with pt and husband and educated them about the benefits of OPPT and how they can address balance deficits better than inpatient PT could. Pt and husband are now agreeable and DC recs have been updated to OPPT. PT will continue to follow.   Recommendations for follow up therapy are one component of a multi-disciplinary discharge planning process, led by the attending physician.  Recommendations may be updated based on patient status, additional functional criteria and insurance authorization.  Follow Up Recommendations  Can patient physically be transported by private vehicle: Yes    Assistance Recommended at Discharge Set up Supervision/Assistance  Patient can return home with the following A little help with walking and/or transfers;A little help with bathing/dressing/bathroom;Assistance with cooking/housework;Assist for transportation;Help with stairs or ramp for entrance;Direct supervision/assist for medications management;Direct supervision/assist for financial management   Equipment Recommendations  None recommended by PT    Recommendations for Other Services       Precautions / Restrictions  Precautions Precautions: Fall Restrictions Weight Bearing Restrictions: Yes LUE Weight Bearing: Non weight bearing     Mobility  Bed Mobility               General bed mobility comments: Pt up in chair    Transfers Overall transfer level: Needs assistance Equipment used: None Transfers: Sit to/from Stand Sit to Stand: Supervision                Ambulation/Gait Ambulation/Gait assistance: Supervision, Min guard (Min guard for navigating obstacles) Gait Distance (Feet): 1200 Feet Assistive device: None Gait Pattern/deviations: Step-through pattern, Decreased stride length, Narrow base of support, Drifts right/left, Staggering right, Staggering left Gait velocity: decreased     General Gait Details: Pt with continued slowed gait and a few minor LOB when stepping over objects.   Stairs             Wheelchair Mobility    Modified Rankin (Stroke Patients Only)       Balance Overall balance assessment: Needs assistance Sitting-balance support: Feet supported Sitting balance-Leahy Scale: Good     Standing balance support: No upper extremity supported, During functional activity Standing balance-Leahy Scale: Fair Standing balance comment: Pt with few minor LOB when stepping over objects in hallway requiring Min Guard/Min A to correct.                 Standardized Balance Assessment Standardized Balance Assessment : Berg Balance Test Berg Balance Test Sit to Stand: Able to stand  independently using hands Standing Unsupported: Able to stand 2 minutes with supervision Sitting with Back Unsupported but Feet Supported on Floor or Stool: Able to sit safely and securely 2 minutes Stand to Sit: Controls descent by using hands Transfers: Able to transfer safely, definite need of  hands Standing Unsupported with Eyes Closed: Able to stand 10 seconds with supervision Standing Ubsupported with Feet Together: Able to place feet together independently but  unable to hold for 30 seconds From Standing, Reach Forward with Outstretched Arm: Reaches forward but needs supervision From Standing Position, Pick up Object from Floor: Unable to pick up and needs supervision From Standing Position, Turn to Look Behind Over each Shoulder: Needs supervision when turning Turn 360 Degrees: Able to turn 360 degrees safely but slowly Standing Unsupported, Alternately Place Feet on Step/Stool: Able to complete >2 steps/needs minimal assist Standing Unsupported, One Foot in Front: Loses balance while stepping or standing Standing on One Leg: Tries to lift leg/unable to hold 3 seconds but remains standing independently Total Score: 28        Cognition Arousal/Alertness: Awake/alert Behavior During Therapy: WFL for tasks assessed/performed Overall Cognitive Status: Within Functional Limits for tasks assessed                                          Exercises Other Exercises Other Exercises: Toes taps on Sani wipes x10 BLE    General Comments General comments (skin integrity, edema, etc.): VSS on RA      Pertinent Vitals/Pain Pain Assessment Pain Assessment: No/denies pain    Home Living                          Prior Function            PT Goals (current goals can now be found in the care plan section) Progress towards PT goals: Progressing toward goals    Frequency    Min 1X/week      PT Plan Discharge plan needs to be updated    Co-evaluation              AM-PAC PT "6 Clicks" Mobility   Outcome Measure  Help needed turning from your back to your side while in a flat bed without using bedrails?: None Help needed moving from lying on your back to sitting on the side of a flat bed without using bedrails?: None Help needed moving to and from a bed to a chair (including a wheelchair)?: A Little Help needed standing up from a chair using your arms (e.g., wheelchair or bedside chair)?: A Little Help  needed to walk in hospital room?: A Little Help needed climbing 3-5 steps with a railing? : A Little 6 Click Score: 20    End of Session Equipment Utilized During Treatment: Gait belt Activity Tolerance: Patient tolerated treatment well Patient left: in chair;with call bell/phone within reach;with family/visitor present;with nursing/sitter in room (Handoff to RN) Nurse Communication: Mobility status PT Visit Diagnosis: Other abnormalities of gait and mobility (R26.89);Muscle weakness (generalized) (M62.81)     Time: 1125-1203 PT Time Calculation (min) (ACUTE ONLY): 38 min  Charges:  $Gait Training: 23-37 mins $Therapeutic Exercise: 8-22 mins                     Shela Nevin, PT, DPT Acute Rehab Services [phone]    [name] 03/11/2023, 1:13 PM

## 2023-03-11 NOTE — Progress Notes (Addendum)
PROGRESS NOTE    [name]  BPZ:[id number] DOB: 1951-06-18 DOA: 02/25/2023 PCP: Eden Emms, NP   Brief Narrative:  72 year old female with history of colon cancer status post partial colectomy and chemotherapy, history of liver cirrhosis felt secondary to previous chemotherapy with oxaliplatin, s/p TIPS procedure at [facility] in 2012, hepatic encephalopathy, presented to hospital with confusion for 2 days.  Patient was on opiates for left wrist fracture and had been causing constipation.  Despite being on higher dose of lactulose he was not having bowel movements and started having confusion nausea vomiting.  In the ED patient was lethargic and hypothermic with pyuria and ammonia elevated at 163. At this time patient has been starting to get better but has been having some hypoglycemic episodes.  Assessment & Plan:   Principal Problem:   Acute hepatic encephalopathy Active Problems:   Decompensated liver cirrhosis with portal HTN and gastric varices   AKI (acute kidney injury)   Hypothyroidism   Hypertension   Hx of colon cancer, stage III   Bradycardia   Anemia   Acute cystitis with hematuria   Protein-calorie malnutrition, severe  Acute hepatic encephalopathy Ammonia level elevated at 163 on admission which improved with lactulose.  Due to diarrhea lactulose dose was reduced.  Improved at this time.  Check LFTs and ammonia level in AM.   Hypoglycemia: Intermittent episodes of hypoglycemia.  Will closely monitor.  Encouraged oral intake.   Hypothermia: Secondary to acute hepatic encephalopathy.     Acute kidney injury: Resolved.  No recent labs.  Hypotension: On midodrine 15 mg p.o. 3 times daily.  Improved blood pressure.  Will decrease midodrine to 10 mg 3 times daily and gradually decrease on discharge.   Liver cirrhosis with portal hypertension s/p TIPS procedure in 2012 -Status post TIPS procedure in 2012. Developed liver cirrhosis after chemotherapy with  oxaliplatin. -MELD score 15, 6% estimated 74-month mortality spironolactone on hold.  Plan to resume on discharge.   Hypothyroidism: Continue Synthroid.  Pancytopenia: Likely chemotherapy-induced.  No labs from today.  Obtain labs in AM.  SVT episodes with bradycardia, new :  Patient was initially at the [facility] long hospital where he had bradycardia with pauses and received atropine.  Cardiology has seen the patient and was in the ICU for possible pacing.  Currently normal sinus rhythm with on supportive care at this time.  No plans for.  Pacemaker placement.  Patient is asymptomatic.     Hx of colon cancer, stage III - status post partial colectomy and chemotherapy, history of liver cirrhosis felt secondary to previous chemotherapy with oxaliplatin, s/p TIPS procedure   Nutritional Assessment: Nutrition Status: Nutrition Problem: Severe Malnutrition Etiology: acute illness Signs/Symptoms: moderate fat depletion, moderate muscle depletion, energy intake < or equal to 50% for > or equal to 5 days, percent weight loss (14.6% in 3 weeks) Percent weight loss: 14.6 % (in 3 weeks) Interventions: Refer to RD note for recommendations  Disposition: CIR has been denied.  At this time patient has been improving with her mobility.  Plan for disposition home with outpatient physical therapy.  DVT prophylaxis: enoxaparin (LOVENOX) injection 40 mg Start: 03/02/23 2000 SCDs Start: 02/26/23 0035    Code Status: Full Code   Family Communication:   Spoke with the patient's husband at bedside.  Status is: Inpatient  Remains inpatient appropriate because: Hypoglycemia, improving clinically, plan for disposition home 03/12/2023  Consultants:  GI  Procedures:  None  Antimicrobials:  Anti-infectives (From admission, onward)  Start     Dose/Rate Route Frequency Ordered Stop   03/02/23 1200  rifaximin (XIFAXAN) tablet 550 mg  Status:  Discontinued        550 mg Oral 2 times daily 03/02/23 1039  03/09/23 0911   02/26/23 2200  cefTRIAXone (ROCEPHIN) 2 g in sodium chloride 0.9 % 100 mL IVPB  Status:  Discontinued        2 g 200 mL/hr over 30 Minutes Intravenous Every 24 hours 02/26/23 0034 02/27/23 0953   02/25/23 2200  cefTRIAXone (ROCEPHIN) 1 g in sodium chloride 0.9 % 100 mL IVPB  Status:  Discontinued        1 g 200 mL/hr over 30 Minutes Intravenous Every 24 hours 02/25/23 2118 02/26/23 0034         Subjective: Today, patient was seen and examined at bedside.  Overall better.  Alert awake and Communicative.  Denies any nausea vomiting fever but  intermittent episodes of hypoglycemia.  Participated better with physical therapy today.  Objective: Vitals:   03/10/23 1620 03/10/23 2027 03/11/23 0009 03/11/23 0550  BP: (!) 101/39 (!) 102/40 (!) 105/52 (!) 120/59  Pulse: (!) 47 (!) 50 (!) 44   Resp: Temp: (!) 97.5 F (36.4 C) (!) 97.3 F (36.3 C) 97.7 F (36.5 C)   TempSrc: Oral Oral Oral Oral  SpO2: 98% 98%  98%  Weight:    69 kg  Height:        Intake/Output Summary (Last 24 hours) at 03/11/2023 0745 Last data filed at 03/10/2023 2200 Gross per 24 hour  Intake 896 ml  Output --  Net 896 ml    Filed Weights   03/08/23 0413 03/10/23 0417 03/11/23 0550  Weight: 67.1 kg 68.1 kg 69 kg    Physical examination: General:  Average built, not in obvious distress HENT:   No scleral pallor or icterus noted. Oral mucosa is moist.  Chest:  Clear breath sounds.  Diminished breath sounds bilaterally. No crackles or wheezes.  CVS: S1 &S2 heard. No murmur.  Regular rate and rhythm. Abdomen: Soft, nontender, nondistended.  Bowel sounds are heard.   Extremities: No cyanosis, clubbing or edema.  Peripheral pulses are palpable. Psych: Alert, awake and oriented, normal mood CNS:  No cranial nerve deficits.  Power equal in all extremities.   Skin: Warm and dry.  No rashes noted.  Data Reviewed: I have personally reviewed the following labs and imaging studies.     CBC: Recent Labs  Lab 03/07/23 0832  WBC 3.4*  NEUTROABS 1.6*  HGB 9.7*  HCT 28.8*  MCV 105.9*  PLT 203    Basic Metabolic Panel: Recent Labs  Lab 03/04/23 1739 03/05/23 0432 03/07/23 0832  NA  --   --  136  K  --   --  4.3  CL  --   --  107  CO2  --   --  21*  GLUCOSE  --   --  117*  BUN  --   --  15  CREATININE  --   --  0.67  CALCIUM  --   --  9.0  MG 1.9 1.8  --   PHOS 3.3 3.0  --     GFR: Estimated Creatinine Clearance: 61.5 mL/min (by C-G formula based on SCr of 0.67 mg/dL). Liver Function Tests: Recent Labs  Lab 03/07/23 0832  AST 69*  ALT 45*  ALKPHOS 218*  BILITOT 1.5*  PROT 5.8*  ALBUMIN 2.8*  No results for input(s): "LIPASE", "AMYLASE" in the last 168 hours. No results for input(s): "AMMONIA" in the last 168 hours.  Coagulation Profile: No results for input(s): "INR", "PROTIME" in the last 168 hours.  Cardiac Enzymes: No results for input(s): "CKTOTAL", "CKMB", "CKMBINDEX", "TROPONINI" in the last 168 hours. BNP (last 3 results) No results for input(s): "PROBNP" in the last 8760 hours. HbA1C: No results for input(s): "HGBA1C" in the last 72 hours. CBG: Recent Labs  Lab 03/10/23 1110 03/10/23 1837 03/11/23 0013 03/11/23 0559 03/11/23 0703  GLUCAP 92 86 87 58* 79    Lipid Profile: No results for input(s): "CHOL", "HDL", "LDLCALC", "TRIG", "CHOLHDL", "LDLDIRECT" in the last 72 hours. Thyroid Function Tests: No results for input(s): "TSH", "T4TOTAL", "FREET4", "T3FREE", "THYROIDAB" in the last 72 hours.  Anemia Panel: No results for input(s): "VITAMINB12", "FOLATE", "FERRITIN", "TIBC", "IRON", "RETICCTPCT" in the last 72 hours. Sepsis Labs: No results for input(s): "PROCALCITON", "LATICACIDVEN" in the last 168 hours.   Recent Results (from the past 240 hour(s))  MRSA Next Gen by PCR, Nasal     Status: None   Collection Time: 03/02/23  7:40 PM   Specimen: Nasal Mucosa; Nasal Swab  Result Value Ref Range Status   MRSA by  PCR Next Gen NOT DETECTED NOT DETECTED Final    Comment: (NOTE) The GeneXpert MRSA Assay (FDA approved for NASAL specimens only), is one component of a comprehensive MRSA colonization surveillance program. It is not intended to diagnose MRSA infection nor to guide or monitor treatment for MRSA infections. Test performance is not FDA approved in patients less than 72 years old. Performed at [facility], [address], [place], [place] [address]      Radiology Studies: No results found.  Scheduled Meds:  (feeding supplement) PROSource Plus  30 mL Oral TID BM   enoxaparin (LOVENOX) injection  40 mg Subcutaneous Q24H   feeding supplement  237 mL Oral TID BM   lactulose  30 g Oral TID   levothyroxine  75 mcg Oral Q0600   midodrine  15 mg Oral TID WC   mouth rinse  15 mL Mouth Rinse 4 times per day   Continuous Infusions:     LOS: 14 days   Joycelyn Das, MD [facility] 03/11/2023, 7:45 AM

## 2023-03-12 DIAGNOSIS — I480 Paroxysmal atrial fibrillation: Secondary | ICD-10-CM

## 2023-03-12 LAB — CBC WITH DIFFERENTIAL/PLATELET
Abs Immature Granulocytes: 0.01 10*3/uL (ref 0.00–0.07)
Basophils Absolute: 0 10*3/uL (ref 0.0–0.1)
Basophils Relative: 1 %
Eosinophils Absolute: 0.1 10*3/uL (ref 0.0–0.5)
Eosinophils Relative: 4 %
HCT: 28 % — ABNORMAL LOW (ref 36.0–46.0)
Hemoglobin: 9.4 g/dL — ABNORMAL LOW (ref 12.0–15.0)
Immature Granulocytes: 0 %
Lymphocytes Relative: 28 %
Lymphs Abs: 0.8 10*3/uL (ref 0.7–4.0)
MCH: 35.7 pg — ABNORMAL HIGH (ref 26.0–34.0)
MCHC: 33.6 g/dL (ref 30.0–36.0)
MCV: 106.5 fL — ABNORMAL HIGH (ref 80.0–100.0)
Monocytes Absolute: 0.2 10*3/uL (ref 0.1–1.0)
Monocytes Relative: 8 %
Neutro Abs: 1.7 10*3/uL (ref 1.7–7.7)
Neutrophils Relative %: 59 %
Platelets: 154 10*3/uL (ref 150–400)
RBC: 2.63 MIL/uL — ABNORMAL LOW (ref 3.87–5.11)
RDW: 17.4 % — ABNORMAL HIGH (ref 11.5–15.5)
WBC: 2.9 10*3/uL — ABNORMAL LOW (ref 4.0–10.5)
nRBC: 0 % (ref 0.0–0.2)

## 2023-03-12 LAB — GLUCOSE, CAPILLARY
Glucose-Capillary: 81 mg/dL (ref 70–99)
Glucose-Capillary: 66 mg/dL — ABNORMAL LOW (ref 70–99)
Glucose-Capillary: 94 mg/dL (ref 70–99)
Glucose-Capillary: 116 mg/dL — ABNORMAL HIGH (ref 70–99)

## 2023-03-12 LAB — COMPREHENSIVE METABOLIC PANEL
ALT: 37 U/L (ref 0–44)
AST: 52 U/L — ABNORMAL HIGH (ref 15–41)
Albumin: 3.5 g/dL (ref 3.5–5.0)
Alkaline Phosphatase: 226 U/L — ABNORMAL HIGH (ref 38–126)
Anion gap: 10 (ref 5–15)
BUN: 17 mg/dL (ref 8–23)
CO2: 21 mmol/L — ABNORMAL LOW (ref 22–32)
Calcium: 9.2 mg/dL (ref 8.9–10.3)
Chloride: 108 mmol/L (ref 98–111)
Creatinine, Ser: 0.61 mg/dL (ref 0.44–1.00)
GFR, Estimated: >60 mL/min (ref >60)
Glucose, Bld: 69 mg/dL — ABNORMAL LOW (ref 70–99)
Potassium: 4.3 mmol/L (ref 3.5–5.1)
Sodium: 139 mmol/L (ref 135–145)
Total Bilirubin: 1.2 mg/dL (ref 0.3–1.2)
Total Protein: 5.9 g/dL — ABNORMAL LOW (ref 6.5–8.1)

## 2023-03-12 LAB — ACTH STIMULATION, 3 TIME POINTS
Cortisol, 30 Min: 14.5 ug/dL
Cortisol, 60 Min: 17.8 ug/dL
Cortisol, Base: 4.2 ug/dL

## 2023-03-12 LAB — PHOSPHORUS: Phosphorus: 3.8 mg/dL (ref 2.5–4.6)

## 2023-03-12 LAB — MAGNESIUM: Magnesium: 1.7 mg/dL (ref 1.7–2.4)

## 2023-03-12 LAB — PROCALCITONIN: Procalcitonin: <0.1 ng/mL

## 2023-03-12 LAB — LACTIC ACID, PLASMA: Lactic Acid, Venous: 1.3 mmol/L (ref 0.5–1.9)

## 2023-03-12 MED ORDER — HYDROCORTISONE 5 MG PO TABS
5.0000 mg | ORAL_TABLET | Freq: Three times a day (TID) | ORAL | Status: DC
  Administered 2023-03-12 – 2023-03-13 (×3): 5 mg via ORAL
  Filled 2023-03-12 (×4): qty 1

## 2023-03-12 NOTE — Progress Notes (Signed)
Ongoing difficulty controlling blood sugar overnight. Boost breeze and three 4 oz juices fortified with sugar provided over course of shift; CBG drops continued.  Notified physician of conversion to afib with hypotension and hypoglycemia. Albumin q6 ordered and administered. One time dose of  midodrine given. See associated flowsheets and documentation.

## 2023-03-12 NOTE — Progress Notes (Signed)
Mobility Specialist Progress Note:   03/12/23 1155  Mobility  Activity Ambulated with assistance in hallway  Level of Assistance Contact guard assist, steadying assist  Assistive Device None  Distance Ambulated (ft) 1000 ft  LUE Weight Bearing NWB  Activity Response Tolerated well  Mobility Referral Yes  $Mobility charge 1 Mobility   Pt eager for mobility session, despite rough night. Only minG required during ambulation. Pt asx throughout, back in bed with all needs met.   Addison Lank Mobility Specialist Please contact via SecureChat or  Rehab office at [phone]

## 2023-03-12 NOTE — Progress Notes (Signed)
PROGRESS NOTE    [name]  ZOX:[id number] DOB: 07-24-1951 DOA: 02/25/2023 PCP: Eden Emms, NP   Brief Narrative:   72 year old female with history of colon cancer status post partial colectomy and chemotherapy, history of liver cirrhosis felt secondary to previous chemotherapy with oxaliplatin, s/p TIPS procedure at [facility] in 2012, hepatic encephalopathy, presented to hospital with confusion for 2 days.  Patient was on opiates for left wrist fracture and had been causing constipation.  Despite being on higher dose of lactulose he was not having bowel movements and started having confusion nausea vomiting.  In the ED patient was lethargic and hypothermic with pyuria and ammonia elevated at 163. At this time patient has been starting to get better but has been having some hypoglycemic episodes, episodes of hypotension and irregular rhythm..  Assessment & Plan:   Principal Problem:   Acute hepatic encephalopathy Active Problems:   Decompensated liver cirrhosis with portal HTN and gastric varices   AKI (acute kidney injury)   Hypothyroidism   Hypertension   Hx of colon cancer, stage III   Bradycardia   Anemia   Acute cystitis with hematuria   Protein-calorie malnutrition, severe  Acute hepatic encephalopathy Improved at this time.  Ammonia level elevated at 163 on admission which improved with lactulose.  Due to diarrhea lactulose dose was reduced. Check  ammonia level in AM.   Hypoglycemia: Intermittent episodes of hypoglycemia.  Will closely monitor.  Encouraged oral intake.  History of POC glucose of 66.  Possibility of renal insufficiency.   Hypothermia: Uncertain at this time.  Baseline temperature low as per the patient's husband at bedside.  Borderline low cortisol so we will try hydrocortisone today.  Question adrenal insufficiency.  Acute kidney injury: Resolved.  Latest creatinine at 0.6.  Hypotension: On midodrine, will discontinue and try full  liquid  Possible adrenal insufficiency.  Patient is status post ACTH stimulation test.  Cortisol in 1hr around 17 but patient does have features concerning for insufficiency including hypoglycemia hypothermia hypotension.  Will discontinue midodrine and put the patient on Cortef 5 mg 3 times daily for now.  Will monitor.  Liver cirrhosis with portal hypertension s/p TIPS procedure in 2012 -Status post TIPS procedure in 2012. Developed liver cirrhosis after chemotherapy with oxaliplatin. -MELD score 15, 6% estimated 31-month mortality spironolactone on hold.  Plan to resume on discharge.   Hypothyroidism: Continue Synthroid.  Pancytopenia: Likely chemotherapy-induced.  Latest labs showing mild leukopenia and anemia.  Platelet count of 154.  SVT episodes with bradycardia, new atrial fibrillation Patient was initially at the [facility] long hospital where he had bradycardia with pauses and received atropine.  Cardiology initially consulted patient and was in the ICU for possible pacing no plan for pacemaker was done.  Patient still has hypotension and had atrial fibrillation which was new.  Will reconsult cardiology for combination of persistent hypertension with new atrial fibrillation.  Will discontinue midodrine.   Hx of colon cancer, stage III - status post partial colectomy and chemotherapy, history of liver cirrhosis felt secondary to previous chemotherapy with oxaliplatin, s/p TIPS procedure   Nutritional Assessment: Nutrition Status: Nutrition Problem: Severe Malnutrition Etiology: acute illness Signs/Symptoms: moderate fat depletion, moderate muscle depletion, energy intake < or equal to 50% for > or equal to 5 days, percent weight loss (14.6% in 3 weeks) Percent weight loss: 14.6 % (in 3 weeks) Interventions: Refer to RD note for recommendations  Disposition: CIR has been denied.  At this time patient has been  improving with her mobility.  Plan for disposition home with outpatient physical  therapy pending blood pressure, blood glucose and rhythm stabilization..  DVT prophylaxis: enoxaparin (LOVENOX) injection 40 mg Start: 03/02/23 2000 SCDs Start: 02/26/23 0035    Code Status: Full Code   Family Communication:   I again spoke with the patient's husband at bedside.  Status is: Inpatient  Remains inpatient appropriate because: Hypoglycemia, new paroxysmal atrial fibrillation, hypothermia, hypotension,  Consultants:  GI Cardiology  Procedures:  None  Antimicrobials:  Anti-infectives (From admission, onward)    Start     Dose/Rate Route Frequency Ordered Stop   03/02/23 1200  rifaximin (XIFAXAN) tablet 550 mg  Status:  Discontinued        550 mg Oral 2 times daily 03/02/23 1039 03/09/23 0911   02/26/23 2200  cefTRIAXone (ROCEPHIN) 2 g in sodium chloride 0.9 % 100 mL IVPB  Status:  Discontinued        2 g 200 mL/hr over 30 Minutes Intravenous Every 24 hours 02/26/23 0034 02/27/23 0953   02/25/23 2200  cefTRIAXone (ROCEPHIN) 1 g in sodium chloride 0.9 % 100 mL IVPB  Status:  Discontinued        1 g 200 mL/hr over 30 Minutes Intravenous Every 24 hours 02/25/23 2118 02/26/23 0034         Subjective: Today, patient was seen and examined at bedside.  Had episode of atrial fibrillation with hypotension and hypoglycemia.  Had received albumin every 6 hrly and 30 mg of midodrine.  She denies any dizziness nausea vomiting shortness of breath or chest pain at this time.  Objective: Vitals:   03/12/23 0905 03/12/23 1020 03/12/23 1200 03/12/23 1201  BP: (!) 94/50 (!) 110/56 130/63 (!) 140/68  Pulse:  (!) 56    Resp:      Temp: (!) 97.5 F (36.4 C) 97.6 F (36.4 C)    TempSrc:  Oral    SpO2: 96% 95% 95% 95%  Weight:      Height:        Intake/Output Summary (Last 24 hours) at 03/12/2023 1252 Last data filed at 03/11/2023 1800 Gross per 24 hour  Intake 236 ml  Output --  Net 236 ml    Filed Weights   03/10/23 0417 03/11/23 0550 03/12/23 0621  Weight: 68.1  kg 69 kg 68.9 kg    Physical examination: Body mass index is 26.07 kg/m.   General:  Average built, not in obvious distress HENT:   No scleral pallor or icterus noted. Oral mucosa is moist.  Chest:  Clear breath sounds.  Diminished breath sounds bilaterally. No crackles or wheezes.  CVS: S1 &S2 heard. No murmur.  Irregular rhythm Abdomen: Soft, nontender, nondistended.  Bowel sounds are heard.   Extremities: No cyanosis, clubbing or edema.  Peripheral pulses are palpable. Psych: Alert, awake and oriented, normal mood CNS:  No cranial nerve deficits.  Power equal in all extremities.   Skin: Warm and dry.  No rashes noted.  Data Reviewed: I have personally reviewed the following labs and imaging studies.    CBC: Recent Labs  Lab 03/07/23 0832 03/12/23 0630  WBC 3.4* 2.9*  NEUTROABS 1.6* 1.7  HGB 9.7* 9.4*  HCT 28.8* 28.0*  MCV 105.9* 106.5*  PLT 203 154    Basic Metabolic Panel: Recent Labs  Lab 03/07/23 0832 03/12/23 0630  NA 136 139  K 4.3 4.3  CL 107 108  CO2 21* 21*  GLUCOSE 117* 69*  BUN 15 17  CREATININE 0.67 0.61  CALCIUM 9.0 9.2  MG  --  1.7  PHOS  --  3.8    GFR: Estimated Creatinine Clearance: 61.5 mL/min (by C-G formula based on SCr of 0.61 mg/dL). Liver Function Tests: Recent Labs  Lab 03/07/23 0832 03/12/23 0630  AST 69* 52*  ALT 45* 37  ALKPHOS 218* 226*  BILITOT 1.5* 1.2  PROT 5.8* 5.9*  ALBUMIN 2.8* 3.5    No results for input(s): "LIPASE", "AMYLASE" in the last 168 hours. No results for input(s): "AMMONIA" in the last 168 hours.  Coagulation Profile: No results for input(s): "INR", "PROTIME" in the last 168 hours.  Cardiac Enzymes: No results for input(s): "CKTOTAL", "CKMB", "CKMBINDEX", "TROPONINI" in the last 168 hours. BNP (last 3 results) No results for input(s): "PROBNP" in the last 8760 hours. HbA1C: No results for input(s): "HGBA1C" in the last 72 hours. CBG: Recent Labs  Lab 03/11/23 2032 03/11/23 2348  03/12/23 0245 03/12/23 0618 03/12/23 1152  GLUCAP 77 76 81 66* 94    Lipid Profile: No results for input(s): "CHOL", "HDL", "LDLCALC", "TRIG", "CHOLHDL", "LDLDIRECT" in the last 72 hours. Thyroid Function Tests: No results for input(s): "TSH", "T4TOTAL", "FREET4", "T3FREE", "THYROIDAB" in the last 72 hours.  Anemia Panel: No results for input(s): "VITAMINB12", "FOLATE", "FERRITIN", "TIBC", "IRON", "RETICCTPCT" in the last 72 hours. Sepsis Labs: Recent Labs  Lab 03/12/23 0630  PROCALCITON <0.10  LATICACIDVEN 1.3     Recent Results (from the past 240 hour(s))  MRSA Next Gen by PCR, Nasal     Status: None   Collection Time: 03/02/23  7:40 PM   Specimen: Nasal Mucosa; Nasal Swab  Result Value Ref Range Status   MRSA by PCR Next Gen NOT DETECTED NOT DETECTED Final    Comment: (NOTE) The GeneXpert MRSA Assay (FDA approved for NASAL specimens only), is one component of a comprehensive MRSA colonization surveillance program. It is not intended to diagnose MRSA infection nor to guide or monitor treatment for MRSA infections. Test performance is not FDA approved in patients less than 74 years old. Performed at [facility], [address], [place], [place] [address]      Radiology Studies: No results found.  Scheduled Meds:  (feeding supplement) PROSource Plus  30 mL Oral TID BM   enoxaparin (LOVENOX) injection  40 mg Subcutaneous Q24H   feeding supplement  1 Container Oral TID BM   lactulose  30 g Oral TID   levothyroxine  75 mcg Oral Q0600   midodrine  10 mg Oral TID WC   mouth rinse  15 mL Mouth Rinse 4 times per day   Continuous Infusions:  albumin human 25 g (03/12/23 0947)      LOS: 15 days   Joycelyn Das, MD [facility] 03/12/2023, 12:52 PM

## 2023-03-12 NOTE — Progress Notes (Signed)
Physical Therapy Treatment Patient Details Name: [name] MRN: [id number] DOB: May 15, 1951 Today's Date: 03/12/2023   History of Present Illness Patient is 72 year old female admitted on 02/25/23 with acute hepatic encephalopathy. Pt with fall in March 2024 that resulted in L wrist fx.  She was on opioids which caused constipation and even with increased lactulose she was not having bowel movements and developed encephalopathy and hypothermia.  Pt with hx including but not limited to colon CA, liver cirrhosis, TIPS, hepatic encephalopathy.    PT Comments    Pt tolerated treatment well today. Pt reports that she had a rough night medically but was agreeable to ambulation today. Pt required a bit more assistance today compared to previous session however likely due to lack of sleep from previous night. No change in DC/DME recs at this time. PT will continue to follow.   Recommendations for follow up therapy are one component of a multi-disciplinary discharge planning process, led by the attending physician.  Recommendations may be updated based on patient status, additional functional criteria and insurance authorization.  Follow Up Recommendations  Can patient physically be transported by private vehicle: Yes    Assistance Recommended at Discharge Set up Supervision/Assistance  Patient can return home with the following A little help with walking and/or transfers;A little help with bathing/dressing/bathroom;Assistance with cooking/housework;Assist for transportation;Help with stairs or ramp for entrance;Direct supervision/assist for medications management;Direct supervision/assist for financial management   Equipment Recommendations  None recommended by PT    Recommendations for Other Services       Precautions / Restrictions Precautions Precautions: Fall Restrictions Weight Bearing Restrictions: Yes LUE Weight Bearing: Non weight bearing     Mobility  Bed Mobility Overal bed  mobility: Needs Assistance Bed Mobility: Supine to Sit, Sit to Supine     Supine to sit: Supervision Sit to supine: Supervision        Transfers Overall transfer level: Needs assistance Equipment used: 1 person hand held assist Transfers: Sit to/from Stand Sit to Stand: Min assist                Ambulation/Gait Ambulation/Gait assistance: Min guard Gait Distance (Feet): 600 Feet Assistive device: 1 person hand held assist, None Gait Pattern/deviations: Step-through pattern, Decreased stride length, Narrow base of support, Drifts right/left, Staggering right, Staggering left Gait velocity: decreased     General Gait Details: Pt with continued slow cautious gait. no LOB noted.   Stairs             Wheelchair Mobility    Modified Rankin (Stroke Patients Only)       Balance Overall balance assessment: Mild deficits observed, not formally tested                                          Cognition Arousal/Alertness: Awake/alert Behavior During Therapy: WFL for tasks assessed/performed Overall Cognitive Status: Within Functional Limits for tasks assessed                                          Exercises      General Comments General comments (skin integrity, edema, etc.): VSS on RA      Pertinent Vitals/Pain Pain Assessment Pain Assessment: No/denies pain    Home Living  Prior Function            PT Goals (current goals can now be found in the care plan section) Progress towards PT goals: Progressing toward goals    Frequency    Min 1X/week      PT Plan Current plan remains appropriate    Co-evaluation              AM-PAC PT "6 Clicks" Mobility   Outcome Measure  Help needed turning from your back to your side while in a flat bed without using bedrails?: None Help needed moving from lying on your back to sitting on the side of a flat bed without using  bedrails?: None Help needed moving to and from a bed to a chair (including a wheelchair)?: A Little Help needed standing up from a chair using your arms (e.g., wheelchair or bedside chair)?: A Little Help needed to walk in hospital room?: A Little Help needed climbing 3-5 steps with a railing? : A Little 6 Click Score: 20    End of Session Equipment Utilized During Treatment: Gait belt Activity Tolerance: Patient tolerated treatment well Patient left: in bed;with call bell/phone within reach;with family/visitor present Nurse Communication: Mobility status PT Visit Diagnosis: Other abnormalities of gait and mobility (R26.89);Muscle weakness (generalized) (M62.81)     Time: 5284-1324 PT Time Calculation (min) (ACUTE ONLY): 14 min  Charges:  $Gait Training: 8-22 mins                     [name], PT, DPT Acute Rehab Services [phone]    [name] 03/12/2023, 4:32 PM

## 2023-03-12 NOTE — Progress Notes (Signed)
Nutrition Follow-up  DOCUMENTATION CODES:   Severe malnutrition in context of acute illness/injury  INTERVENTION:  Discontinue Carnation Instant Breakfast Continue Boost Breeze po TID as tolerated, each supplement provides 250 kcal and 9 grams of protein 30 ml ProSource Plus TID, each supplement provides 100 kcals and 15 grams protein.  Snacks TID between meals to help prevent hypoglycemia Encouraged pt's husband to bring Premier Protein shakes for nighttime use  NUTRITION DIAGNOSIS:   Severe Malnutrition related to acute illness as evidenced by moderate fat depletion, moderate muscle depletion, energy intake < or equal to 50% for > or equal to 5 days, percent weight loss (14.6% in 3 weeks). - remains applicable  GOAL:   Patient will meet greater than or equal to 90% of their needs - improving, PO intake has increased, addressing via meals, snacks and nutrition supplements  MONITOR:   Diet advancement, Labs, Weight trends, TF tolerance  REASON FOR ASSESSMENT:   Consult Other (Comment) (patient glucose has been dropping. Intake is improving.)  ASSESSMENT:   72 y.o. female with medical history significant for prior colon cancer diagnosed in 2003 s/p partial colectomy and chemotherapy, nonalcoholic decompensated cirrhosis s/p TIPS placement, recent wrist fracture (01/29/23) who presented to ED due to confusion for the past 2 days.  4/04 Admit 4/05 NPO, NGT placed 4/08 failed SLP eval, TF started at trickle 4/09 Diet advanced to Dysphagia 3 4/11 diet advanced to regular 4/12 TF discontinued  Pt was planned for d/c however developed hypotension and irregular rhythm, as well as intermittent hypoglycemia.  Noted pt with possible adrenal insufficiency given hypoglycemia, hypothermia and hypotension.   Spoke with pt at bedside. She is in good spirits. Her husband present at bedside as well. Pt reports that her appetite is much improved. She did not like Ensure or [employer], d/c those supplements. She was sipping on a Boost Breeze just prior to visit. She likes these supplements better. She is also consuming ProSource as ordered.   Meal completions: 4/15: 100% lunch, 50% dinner 4/16: 75% breakfast 4/17: 75% breakfast, 100% lunch, 50% dinner 4/18: 100% x breakfast and dinner   Reviewed CBG documentation history. Her low blood sugars appear to be more frequent in the middle of the night to early morning. Husband reports that at home she gets sweaty and hot but doesn't check blood sugars so isn't sure if this is hypoglycemia related or other cause.   Discussed adding snacks between meals to encourage smaller, more frequent PO intake especially at night before bed to aid in blood sugar control. Will order balanced snacks. Her husband is concerned that she will start eating snacks and not eat meals d/t fullness.   Weight history: 04/05: 61 kg Current weight: 68.9 kg  Medications: lactulose  Labs: alkaline phosphatase 226, AST 52, CBG's 66-111 x24 hours  Diet Order:   Diet Order             Diet regular Room service appropriate? Yes with Assist; Fluid consistency: Thin  Diet effective now                   EDUCATION NEEDS:   Education needs have been addressed  Skin:  Skin Assessment: Reviewed RN Assessment Skin Integrity Issues:: Other (Comment) Other: IAD - buttocks  Last BM:  4/18  Height:   Ht Readings from Last 1 Encounters:  03/02/23  (1.626 m)    Weight:   Wt Readings from Last 1 Encounters:  03/12/23 68.9 kg  BMI:  Body mass index is 26.07 kg/m.  Estimated Nutritional Needs:   Kcal:  1900-2100 kcals  Protein:  90-110 g  Fluid:  >/= 1.9 L  Drusilla Kanner, RDN, LDN Clinical Nutrition

## 2023-03-13 LAB — CULTURE, BLOOD (ROUTINE X 2)

## 2023-03-13 LAB — AMMONIA: Ammonia: 40 umol/L — ABNORMAL HIGH (ref 9–35)

## 2023-03-13 LAB — GLUCOSE, CAPILLARY: Glucose-Capillary: 114 mg/dL — ABNORMAL HIGH (ref 70–99)

## 2023-03-13 MED ORDER — HYDROCORTISONE 5 MG PO TABS: 5.0000 mg | ORAL_TABLET | Freq: Three times a day (TID) | ORAL | 0 refills | Status: AC

## 2023-03-13 MED ORDER — PROSOURCE PLUS PO LIQD: 30.0000 mL | Freq: Three times a day (TID) | ORAL | Status: DC

## 2023-03-13 MED ORDER — ZINC OXIDE 40 % EX OINT: TOPICAL_OINTMENT | Freq: Every day | CUTANEOUS | 0 refills | Status: AC | PRN

## 2023-03-13 MED ORDER — POLYETHYLENE GLYCOL 3350 17 G PO PACK: 17.0000 g | PACK | Freq: Every day | ORAL | 0 refills | Status: DC | PRN

## 2023-03-13 MED ORDER — LACTULOSE 10 GM/15ML PO SOLN: 30.0000 g | Freq: Three times a day (TID) | ORAL | 0 refills | Status: DC

## 2023-03-13 NOTE — Progress Notes (Deleted)
Physician Discharge Summary  [name] WGN:[id number] DOB: [birth year] DOA: 02/25/2023  PCP: Eden Emms, NP  Admit date: 02/25/2023 Discharge date: 03/13/2023  Admitted From: Home  Discharge disposition: Home   Recommendations for Outpatient Follow-Up:   Follow up with your primary care provider in one week.  [name] has borderline adrenal insufficiency symptoms and would benefit from further evaluation as outpatient/endocrinology referral.  Has been started on Cortef which could be titrated as outpatient with outpatient evaluation. Check CBC, BMP, LFT, magnesium in the next visit   Discharge Diagnosis:   Principal Problem:   Acute hepatic encephalopathy Active Problems:   Decompensated liver cirrhosis with portal HTN and gastric varices   AKI (acute kidney injury)   Hypothyroidism   Hypertension   Hx of colon cancer, stage III   Bradycardia   Anemia   Acute cystitis with hematuria   Protein-calorie malnutrition, severe  Discharge Condition: Improved.  Diet recommendation: Low sodium, heart healthy.  Fluid restriction 1500  mL per day  Wound care: None.  Code status: Full.   History of Present Illness:   72 year old female with history of colon cancer status post partial colectomy and chemotherapy, history of liver cirrhosis felt secondary to previous chemotherapy with oxaliplatin, s/p TIPS procedure at [facility] in 2012, hepatic encephalopathy, presented to hospital with confusion for 2 days.  [name] was on opiates for left wrist fracture and had been causing constipation.  Despite being on higher dose of lactulose, he was not having bowel movements and started having confusion nausea vomiting.  In the ED [name] was lethargic and hypothermic with pyuria and ammonia elevated at 163. [name] was then admitted to hospital for further evaluation and treatment.   Hospital Course:   Following conditions were addressed during hospitalization as listed  below,  Acute hepatic encephalopathy Improved at this time.  Ammonia level elevated at 163 on admission which improved with lactulose to 40..  Due to diarrhea, lactulose dose was reduced on discharge.  [name]'s husband and [name] were counseled on the importance of taking lactulose to have adequate bowel movements.   Hypoglycemia: Improved at this time.  Encouraged oral intake.  Latest POC glucose of 114.   Hypothermia: Uncertain at this time.  Baseline temperature low as per the [name]'s husband at bedside.  Temperature prior to discharge was 97.5.  Blood cultures were drawn which were negative in 1 day.  [name] did not have leukocytosis or signs of infection.  Procalcitonin was less than 0.10.  Lactate was within normal range.   Acute kidney injury: Resolved.  Latest creatinine at 0.6.   Hypotension: Was initially on midodrine.  This has been discontinued.  Has been started on Coreg.  Blood pressure has improved at this time.   Possible adrenal insufficiency.  [name] is status post ACTH stimulation test.  Cortisol in 1hr around 17 but [name] does have features concerning for insufficiency including hypoglycemia hypothermia hypotension.  [name] was initially on midodrine which was discontinued and [name] was put on  Cortef 5 mg 3 times daily improvement in blood pressure, blood glucose levels and temperature.  Plan is to continue on discharge.  Might benefit from PCP/endocrinology follow-up on discharge.   Liver cirrhosis with portal hypertension s/p TIPS procedure in 2012 -Status post TIPS procedure in 2012. Developed liver cirrhosis after chemotherapy with oxaliplatin. -MELD score 15, 6% estimated 54-month mortality spironolactone on hold.  Plan to resume on discharge.   Hypothyroidism: Continue Synthroid.   Pancytopenia: Likely chemotherapy-induced.  Latest  labs showing mild leukopenia and anemia.  Platelet count of 154.   SVT episodes with bradycardia,  [name] was initially at the  [facility] where he had bradycardia with pauses and received atropine.  Cardiology initially consulted [name] and was in the ICU for possible pacing no plan for pacemaker was done.  [name] did have episode of atrial fibrillation while in the hospital which subsequently converted by itself after discontinuation. Discussed this finding with the [name]'s family.  [name] does have history of hepatic encephalopathy, confusion, falls and history of liver failure with varices.  Due to high risk of bleeding, decision was made not to proceed with anticoagulation.    Hx of colon cancer, stage III - status post partial colectomy and chemotherapy, history of liver cirrhosis felt secondary to previous chemotherapy with oxaliplatin, s/p TIPS procedure    Severe Malnutrition, present on admission. Body mass index is 26.93 kg/m.  Etiology: acute illness Signs/Symptoms: moderate fat depletion, moderate muscle depletion, energy intake < or equal to 50% for > or equal to 5 days, percent weight loss (14.6% in 3 weeks) Percent weight loss: 14.6 % (in 3 weeks) Recommended nutritional supplements.  Deconditioning debility.  [name] was initially considered for CIR but at this time [name] has progressed to the point that she has been considered stable for disposition home with outpatient physical therapy.   Disposition.  At this time, [name] is stable for disposition home with outpatient PCP follow-up.  Communicated with the [name]'s spouse prior to disposition.  Medical Consultants:   GI Cardiology Critical care  Procedures:    None Subjective:   Today, [name] was seen and examined at bedside.  Denies any nausea vomiting chest pain shortness of breath dizziness.  Has improvement of blood pressure overnight after starting cortef.  Discharge Exam:   Vitals:   03/13/23 0010 03/13/23 0402  BP: (!) 99/59 97/60  Pulse: (!) 47 (!) 52  Resp: 16 16  Temp: (!) 97.4 F (36.3 C) (!) 97.4 F  (36.3 C)  SpO2:  98%   Vitals:   03/12/23 1538 03/12/23 1929 03/13/23 0010 03/13/23 0402  BP: 105/60 (!) 92/55 (!) 99/59 97/60  Pulse:  (!) 50 (!) 47 (!) 52  Resp: Temp: (!) 97.5 F (36.4 C) (!) 97.4 F (36.3 C) (!) 97.4 F (36.3 C) (!) 97.4 F (36.3 C)  TempSrc: Oral Oral Oral Oral  SpO2: 96% 98%  98%  Weight:    71.2 kg  Height:       Body mass index is 26.93 kg/m.   General: Alert awake, not in obvious distress, elderly female, HENT: pupils equally reacting to light,  No scleral pallor or icterus noted. Oral mucosa is moist.  Chest: .  Diminished breath sounds bilaterally. No crackles or wheezes.  CVS: S1 &S2 heard. No murmur.  Regular rate and rhythm. Abdomen: Soft, nontender, nondistended.  Bowel sounds are heard.   Extremities: No cyanosis, clubbing or edema.  Peripheral pulses are palpable. Psych: Alert, awake and oriented, normal mood CNS:  No cranial nerve deficits.  Power equal in all extremities.   Skin: Warm and dry.  No rashes noted.  The results of significant diagnostics from this hospitalization (including imaging, microbiology, ancillary and laboratory) are listed below for reference.     Diagnostic Studies:   US ABDOMEN LIMITED WITH LIVER DOPPLER  Result Date: 02/26/2023 CLINICAL DATA:  History of cirrhosis with prior TIPS placement at [facility] in 2012 due to variceal hemorrhage.  Hepatic encephalopathy. EXAM: DUPLEX ULTRASOUND OF LIVER AND TIPS SHUNT TECHNIQUE: Color and duplex Doppler ultrasound was performed to evaluate the hepatic in-flow and out-flow vessels. COMPARISON:  PRIOR DUPLEX ULTRASOUND ON 05/29/2021 FINDINGS: Portal Vein Velocities Main:  86 cm/sec Right:  21 cm/sec Left:  32 cm/sec TIPS Stent Velocities Proximal:  116 cm/sec Mid:  109 Distal:  71 cm/sec IVC: Present and patent with normal respiratory phasicity. Hepatic Vein Velocities Right:  47 cm/sec Mid:  48 cm/sec Left:  45 cm/sec Splenic Vein: 28 cm/sec Superior Mesenteric Vein: 54  cm/sec Hepatic Artery: 136 cm/sec Ascities: Absent Varices: Absent Other findings: TIPS shunt is normally patent without evidence to suggest thrombus or stenosis. No evidence of portal vein thrombus. IMPRESSION: Normally patent TIPS shunt. No evidence of portal vein thrombus or TIPS stenosis. Electronically Signed   By: Irish Lack M.D.   On: 02/26/2023 15:07   DG Abd 1 View  Result Date: 02/26/2023 CLINICAL DATA:  Feeding tube placement EXAM: ABDOMEN - 1 VIEW COMPARISON:  02/24/2023 FINDINGS: Bowel gas pattern is nonspecific. Moderate amount of stool is seen in colon. Tip of NG tube is seen in the fundus of the stomach. Distal portion of NG tube is coiled with its tip pointing cephalad. There is a vascular stent overlying the liver IMPRESSION: Tip of NG tube is seen in the fundus of the stomach. Distal course of NG tube is coiled within stomach with its tip pointing cephalad. Electronically Signed   By: Ernie Avena M.D.   On: 02/26/2023 15:00     Labs:   Basic Metabolic Panel: Recent Labs  Lab 03/07/23 0832 03/12/23 0630  NA 136 139  K 4.3 4.3  CL 107 108  CO2 21* 21*  GLUCOSE 117* 69*  BUN 15 17  CREATININE 0.67 0.61  CALCIUM 9.0 9.2  MG  --  1.7  PHOS  --  3.8   GFR Estimated Creatinine Clearance: 62.4 mL/min (by C-G formula based on SCr of 0.61 mg/dL). Liver Function Tests: Recent Labs  Lab 03/07/23 0832 03/12/23 0630  AST 69* 52*  ALT 45* 37  ALKPHOS 218* 226*  BILITOT 1.5* 1.2  PROT 5.8* 5.9*  ALBUMIN 2.8* 3.5   No results for input(s): "LIPASE", "AMYLASE" in the last 168 hours. Recent Labs  Lab 03/13/23 0053  AMMONIA 40*   Coagulation profile No results for input(s): "INR", "PROTIME" in the last 168 hours.  CBC: Recent Labs  Lab 03/07/23 0832 03/12/23 0630  WBC 3.4* 2.9*  NEUTROABS 1.6* 1.7  HGB 9.7* 9.4*  HCT 28.8* 28.0*  MCV 105.9* 106.5*  PLT 203 154   Cardiac Enzymes: No results for input(s): "CKTOTAL", "CKMB", "CKMBINDEX",  "TROPONINI" in the last 168 hours. BNP: Invalid input(s): "POCBNP" CBG: Recent Labs  Lab 03/12/23 0245 03/12/23 0618 03/12/23 1152 03/12/23 1836 03/13/23 0006  GLUCAP 81 66* 94 116* 114*   D-Dimer No results for input(s): "DDIMER" in the last 72 hours. Hgb A1c No results for input(s): "HGBA1C" in the last 72 hours. Lipid Profile No results for input(s): "CHOL", "HDL", "LDLCALC", "TRIG", "CHOLHDL", "LDLDIRECT" in the last 72 hours. Thyroid function studies No results for input(s): "TSH", "T4TOTAL", "T3FREE", "THYROIDAB" in the last 72 hours.  Invalid input(s): "FREET3" Anemia work up No results for input(s): "VITAMINB12", "FOLATE", "FERRITIN", "TIBC", "IRON", "RETICCTPCT" in the last 72 hours. Microbiology Recent Results (from the past 240 hour(s))  Culture, blood (Routine X 2) w Reflex to ID Panel     Status: None (Preliminary result)  Collection Time: 03/12/23  6:30 AM   Specimen: BLOOD RIGHT HAND  Result Value Ref Range Status   Specimen Description BLOOD RIGHT HAND  Final   Special Requests   Final    BOTTLES DRAWN AEROBIC AND ANAEROBIC Blood Culture adequate volume   Culture   Final    NO GROWTH 1 DAY Performed at [facility], [address] [place], [place] [address]    Report Status PENDING  Incomplete  Culture, blood (Routine X 2) w Reflex to ID Panel     Status: None (Preliminary result)   Collection Time: 03/12/23  6:30 AM   Specimen: BLOOD RIGHT HAND  Result Value Ref Range Status   Specimen Description BLOOD RIGHT HAND  Final   Special Requests   Final    BOTTLES DRAWN AEROBIC AND ANAEROBIC Blood Culture adequate volume   Culture   Final    NO GROWTH 1 DAY Performed at [facility], [address] [place], [place] [address]    Report Status PENDING  Incomplete     Discharge Instructions:   Discharge Instructions     Call MD for:  persistant nausea and vomiting   Complete by: As directed    Call MD for:  severe uncontrolled pain    Complete by: As directed    Call MD for:  temperature >100.4   Complete by: As directed    Diet general   Complete by: As directed    Discharge instructions   Complete by: As directed    Follow-up with your primary care provider in 1 week.  Discuss about cardiology/endocrinology referral.  Continue lactulose and MiraLAX at home.  Seek medical attention for worsening symptoms.  Follow-up with orthopedics as scheduled by you.   Increase activity slowly   Complete by: As directed    No wound care   Complete by: As directed       Allergies as of 03/13/2023       Reactions   Contrast Media [iodinated Contrast Media] Hives        Medication List     STOP taking these medications    doxycycline 50 MG capsule Commonly known as: VIBRAMYCIN   furosemide 20 MG tablet Commonly known as: LASIX   nitrofurantoin (macrocrystal-monohydrate) 100 MG capsule Commonly known as: MACROBID       TAKE these medications    (feeding supplement) PROSource Plus liquid Take 30 mLs by mouth 3 (three) times daily between meals.   acidophilus Caps capsule Take 1 capsule by mouth daily.   albuterol 108 (90 Base) MCG/ACT inhaler Commonly known as: VENTOLIN HFA Inhale 1-2 puffs into the lungs every 6 (six) hours as needed for wheezing or shortness of breath.   Cranberry 50 MG Chew Chew 50 mg by mouth 2 (two) times daily.   estradiol 0.1 MG/GM vaginal cream Commonly known as: ESTRACE Place 1 Applicatorful vaginally every other day.   hydrocortisone 5 MG tablet Commonly known as: CORTEF Take 1 tablet (5 mg total) by mouth 3 (three) times daily.   lactulose 10 GM/15ML solution Commonly known as: CHRONULAC Take 45 mLs (30 g total) by mouth 3 (three) times daily. What changed: how much to take   levOCARNitine 330 MG tablet Commonly known as: CARNITOR Take 330 mg by mouth 3 (three) times daily.   liver oil-zinc oxide 40 % ointment Commonly known as: DESITIN Apply topically daily as  needed for irritation.   Multi-Vitamin tablet Take 1 tablet by mouth  daily.   polyethylene glycol 17 g packet Commonly known as: MIRALAX / GLYCOLAX Take 17 g by mouth daily as needed for mild constipation.   spironolactone 50 MG tablet Commonly known as: ALDACTONE Take 0.5 tablets (25 mg total) by mouth daily.   Synthroid 75 MCG tablet Generic drug: levothyroxine Take 1 tablet (75 mcg total) by mouth daily before breakfast. Must be seen in office for more refills. What changed: additional instructions   zinc gluconate 50 MG tablet Take 50 mg by mouth 3 (three) times daily.        Follow-up Information     Louanne Skye Devoria Albe., NP Follow up.   Specialty: Cardiology Why: Humberto Seals - [address] location - cardiology follow-up arranged on Friday Apr 02, 2023 at 10:55 AM (Arrive by 10:40 AM). Alden Server is one of our nurse practitioners with the [facility] team. Contact information: [address] [place] [address] [phone]         [facility] Follow up.   Specialty: Rehabilitation Why: they will call you to set up appt times, if you do not hear from themin 2 to 3 business days please give them a call Contact information: [address] [id number] mc [place] [address] [phone]        Eden Emms, NP Follow up in 1 week(s).   Specialties: Nurse Practitioner, Family Medicine Contact information: [address] [place] [address] [phone]                  Time coordinating discharge: 39 minutes  Signed:  Neziah Vogelgesang  [facility] 03/13/2023, 8:54 AM

## 2023-03-13 NOTE — Plan of Care (Signed)
  Problem: Education: Goal: Knowledge of General Education information will improve Description Including pain rating scale, medication(s)/side effects and non-pharmacologic comfort measures Outcome: Progressing   Problem: Health Behavior/Discharge Planning: Goal: Ability to manage health-related needs will improve Outcome: Progressing   

## 2023-03-13 NOTE — Discharge Summary (Signed)
Physician Discharge Summary  Catharine Kettlewell ZOX:[id number] DOB: Jul 09, 1951 DOA: 02/25/2023  PCP: Eden Emms, NP  Admit date: 02/25/2023 Discharge date: 03/13/2023  Admitted From: Home  Discharge disposition: Home   Recommendations for Outpatient Follow-Up:   Follow up with your primary care provider in one week.  Patient has borderline adrenal insufficiency symptoms and would benefit from further evaluation as outpatient/endocrinology referral.  Has been started on Cortef which could be titrated as outpatient with outpatient evaluation. Check CBC, BMP, LFT, magnesium in the next visit   Discharge Diagnosis:   Principal Problem:   Acute hepatic encephalopathy Active Problems:   Decompensated liver cirrhosis with portal HTN and gastric varices   AKI (acute kidney injury)   Hypothyroidism   Hypertension   Hx of colon cancer, stage III   Bradycardia   Anemia   Acute cystitis with hematuria   Protein-calorie malnutrition, severe  Discharge Condition: Improved.  Diet recommendation: Low sodium, heart healthy.  Fluid restriction 1500  mL per day  Wound care: None.  Code status: Full.   History of Present Illness:   72 year old female with history of colon cancer status post partial colectomy and chemotherapy, history of liver cirrhosis felt secondary to previous chemotherapy with oxaliplatin, s/p TIPS procedure at [facility] in 2012, hepatic encephalopathy, presented to hospital with confusion for 2 days.  Patient was on opiates for left wrist fracture and had been causing constipation.  Despite being on higher dose of lactulose, he was not having bowel movements and started having confusion nausea vomiting.  In the ED patient was lethargic and hypothermic with pyuria and ammonia elevated at 163. Patient was then admitted to hospital for further evaluation and treatment.   Hospital Course:   Following conditions were addressed during hospitalization as listed  below,  Acute hepatic encephalopathy Improved at this time.  Ammonia level elevated at 163 on admission which improved with lactulose to 40..  Due to diarrhea, lactulose dose was reduced on discharge.  Patient's husband and patient were counseled on the importance of taking lactulose to have adequate bowel movements.   Hypoglycemia: Improved at this time.  Encouraged oral intake.  Latest POC glucose of 114.   Hypothermia: Uncertain at this time.  Baseline temperature low as per the patient's husband at bedside.  Temperature prior to discharge was 97.5.  Blood cultures were drawn which were negative in 1 day.  Patient did not have leukocytosis or signs of infection.  Procalcitonin was less than 0.10.  Lactate was within normal range.   Acute kidney injury: Resolved.  Latest creatinine at 0.6.   Hypotension: Was initially on midodrine.  This has been discontinued.  Has been started on Coreg.  Blood pressure has improved at this time.   Possible adrenal insufficiency.  Patient is status post ACTH stimulation test.  Cortisol in 1hr around 17 but patient does have features concerning for insufficiency including hypoglycemia hypothermia hypotension.  Patient was initially on midodrine which was discontinued and patient was put on  Cortef 5 mg 3 times daily improvement in blood pressure, blood glucose levels and temperature.  Plan is to continue on discharge.  Might benefit from PCP/endocrinology follow-up on discharge.   Liver cirrhosis with portal hypertension s/p TIPS procedure in 2012 -Status post TIPS procedure in 2012. Developed liver cirrhosis after chemotherapy with oxaliplatin. -MELD score 15, 6% estimated 26-month mortality spironolactone on hold.  Plan to resume on discharge.   Hypothyroidism: Continue Synthroid.   Pancytopenia: Likely chemotherapy-induced.  Latest  labs showing mild leukopenia and anemia.  Platelet count of 154.   SVT episodes with bradycardia,  Patient was initially at the  [facility] where he had bradycardia with pauses and received atropine.  Cardiology initially consulted patient and was in the ICU for possible pacing no plan for pacemaker was done.  Patient did have episode of atrial fibrillation while in the hospital which subsequently converted by itself after discontinuation. Discussed this finding with the patient's family.  Patient does have history of hepatic encephalopathy, confusion, falls and history of liver failure with varices.  Due to high risk of bleeding, decision was made not to proceed with anticoagulation.    Hx of colon cancer, stage III - status post partial colectomy and chemotherapy, history of liver cirrhosis felt secondary to previous chemotherapy with oxaliplatin, s/p TIPS procedure    Severe Malnutrition, present on admission. Body mass index is 26.93 kg/m.  Etiology: acute illness Signs/Symptoms: moderate fat depletion, moderate muscle depletion, energy intake < or equal to 50% for > or equal to 5 days, percent weight loss (14.6% in 3 weeks) Percent weight loss: 14.6 % (in 3 weeks) Recommended nutritional supplements.  Deconditioning debility.  Patient was initially considered for CIR but at this time patient has progressed to the point that she has been considered stable for disposition home with outpatient physical therapy.   Disposition.  At this time, patient is stable for disposition home with outpatient PCP follow-up.  Communicated with the patient's spouse prior to disposition.  Medical Consultants:   GI Cardiology Critical care  Procedures:    None Subjective:   Today, patient was seen and examined at bedside.  Denies any nausea vomiting chest pain shortness of breath dizziness.  Has improvement of blood pressure overnight after starting cortef.  Discharge Exam:   Vitals:   03/13/23 0010 03/13/23 0402  BP: (!) 99/59 97/60  Pulse: (!) 47 (!) 52  Resp: 16 16  Temp: (!) 97.4 F (36.3 C) (!) 97.4 F  (36.3 C)  SpO2:  98%   Vitals:   03/12/23 1538 03/12/23 1929 03/13/23 0010 03/13/23 0402  BP: 105/60 (!) 92/55 (!) 99/59 97/60  Pulse:  (!) 50 (!) 47 (!) 52  Resp: Temp: (!) 97.5 F (36.4 C) (!) 97.4 F (36.3 C) (!) 97.4 F (36.3 C) (!) 97.4 F (36.3 C)  TempSrc: Oral Oral Oral Oral  SpO2: 96% 98%  98%  Weight:    71.2 kg  Height:       Body mass index is 26.93 kg/m.   General: Alert awake, not in obvious distress, elderly female, HENT: pupils equally reacting to light,  No scleral pallor or icterus noted. Oral mucosa is moist.  Chest: .  Diminished breath sounds bilaterally. No crackles or wheezes.  CVS: S1 &S2 heard. No murmur.  Regular rate and rhythm. Abdomen: Soft, nontender, nondistended.  Bowel sounds are heard.   Extremities: No cyanosis, clubbing or edema.  Peripheral pulses are palpable. Psych: Alert, awake and oriented, normal mood CNS:  No cranial nerve deficits.  Power equal in all extremities.   Skin: Warm and dry.  No rashes noted.  The results of significant diagnostics from this hospitalization (including imaging, microbiology, ancillary and laboratory) are listed below for reference.     Diagnostic Studies:   US ABDOMEN LIMITED WITH LIVER DOPPLER  Result Date: 02/26/2023 CLINICAL DATA:  History of cirrhosis with prior TIPS placement at [facility] in 2012 due to variceal hemorrhage.  Hepatic encephalopathy. EXAM: DUPLEX ULTRASOUND OF LIVER AND TIPS SHUNT TECHNIQUE: Color and duplex Doppler ultrasound was performed to evaluate the hepatic in-flow and out-flow vessels. COMPARISON:  PRIOR DUPLEX ULTRASOUND ON 05/29/2021 FINDINGS: Portal Vein Velocities Main:  86 cm/sec Right:  21 cm/sec Left:  32 cm/sec TIPS Stent Velocities Proximal:  116 cm/sec Mid:  109 Distal:  71 cm/sec IVC: Present and patent with normal respiratory phasicity. Hepatic Vein Velocities Right:  47 cm/sec Mid:  48 cm/sec Left:  45 cm/sec Splenic Vein: 28 cm/sec Superior Mesenteric Vein: 54  cm/sec Hepatic Artery: 136 cm/sec Ascities: Absent Varices: Absent Other findings: TIPS shunt is normally patent without evidence to suggest thrombus or stenosis. No evidence of portal vein thrombus. IMPRESSION: Normally patent TIPS shunt. No evidence of portal vein thrombus or TIPS stenosis. Electronically Signed   By: Irish Lack M.D.   On: 02/26/2023 15:07   DG Abd 1 View  Result Date: 02/26/2023 CLINICAL DATA:  Feeding tube placement EXAM: ABDOMEN - 1 VIEW COMPARISON:  02/24/2023 FINDINGS: Bowel gas pattern is nonspecific. Moderate amount of stool is seen in colon. Tip of NG tube is seen in the fundus of the stomach. Distal portion of NG tube is coiled with its tip pointing cephalad. There is a vascular stent overlying the liver IMPRESSION: Tip of NG tube is seen in the fundus of the stomach. Distal course of NG tube is coiled within stomach with its tip pointing cephalad. Electronically Signed   By: Ernie Avena M.D.   On: 02/26/2023 15:00     Labs:   Basic Metabolic Panel: Recent Labs  Lab 03/07/23 0832 03/12/23 0630  NA 136 139  K 4.3 4.3  CL 107 108  CO2 21* 21*  GLUCOSE 117* 69*  BUN 15 17  CREATININE 0.67 0.61  CALCIUM 9.0 9.2  MG  --  1.7  PHOS  --  3.8    GFR Estimated Creatinine Clearance: 62.4 mL/min (by C-G formula based on SCr of 0.61 mg/dL). Liver Function Tests: Recent Labs  Lab 03/07/23 0832 03/12/23 0630  AST 69* 52*  ALT 45* 37  ALKPHOS 218* 226*  BILITOT 1.5* 1.2  PROT 5.8* 5.9*  ALBUMIN 2.8* 3.5    No results for input(s): "LIPASE", "AMYLASE" in the last 168 hours. Recent Labs  Lab 03/13/23 0053  AMMONIA 40*    Coagulation profile No results for input(s): "INR", "PROTIME" in the last 168 hours.  CBC: Recent Labs  Lab 03/07/23 0832 03/12/23 0630  WBC 3.4* 2.9*  NEUTROABS 1.6* 1.7  HGB 9.7* 9.4*  HCT 28.8* 28.0*  MCV 105.9* 106.5*  PLT 203 154    Cardiac Enzymes: No results for input(s): "CKTOTAL", "CKMB", "CKMBINDEX",  "TROPONINI" in the last 168 hours. BNP: Invalid input(s): "POCBNP" CBG: Recent Labs  Lab 03/12/23 0245 03/12/23 0618 03/12/23 1152 03/12/23 1836 03/13/23 0006  GLUCAP 81 66* 94 116* 114*    D-Dimer No results for input(s): "DDIMER" in the last 72 hours. Hgb A1c No results for input(s): "HGBA1C" in the last 72 hours. Lipid Profile No results for input(s): "CHOL", "HDL", "LDLCALC", "TRIG", "CHOLHDL", "LDLDIRECT" in the last 72 hours. Thyroid function studies No results for input(s): "TSH", "T4TOTAL", "T3FREE", "THYROIDAB" in the last 72 hours.  Invalid input(s): "FREET3" Anemia work up No results for input(s): "VITAMINB12", "FOLATE", "FERRITIN", "TIBC", "IRON", "RETICCTPCT" in the last 72 hours. Microbiology Recent Results (from the past 240 hour(s))  Culture, blood (Routine X 2) w Reflex to ID Panel  Status: None (Preliminary result)   Collection Time: 03/12/23  6:30 AM   Specimen: BLOOD RIGHT HAND  Result Value Ref Range Status   Specimen Description BLOOD RIGHT HAND  Final   Special Requests   Final    BOTTLES DRAWN AEROBIC AND ANAEROBIC Blood Culture adequate volume   Culture   Final    NO GROWTH 1 DAY Performed at [facility], [address] [place], [place] [address]    Report Status PENDING  Incomplete  Culture, blood (Routine X 2) w Reflex to ID Panel     Status: None (Preliminary result)   Collection Time: 03/12/23  6:30 AM   Specimen: BLOOD RIGHT HAND  Result Value Ref Range Status   Specimen Description BLOOD RIGHT HAND  Final   Special Requests   Final    BOTTLES DRAWN AEROBIC AND ANAEROBIC Blood Culture adequate volume   Culture   Final    NO GROWTH 1 DAY Performed at [facility], [address], [place], [place] [address]    Report Status PENDING  Incomplete     Discharge Instructions:   Discharge Instructions     Call MD for:  persistant nausea and vomiting   Complete by: As directed    Call MD for:  severe uncontrolled pain    Complete by: As directed    Call MD for:  temperature >100.4   Complete by: As directed    Diet general   Complete by: As directed    Discharge instructions   Complete by: As directed    Follow-up with your primary care provider in 1 week.  Discuss about cardiology/endocrinology referral.  Continue lactulose and MiraLAX at home.  Seek medical attention for worsening symptoms.  Follow-up with orthopedics as scheduled by you.   Increase activity slowly   Complete by: As directed    No wound care   Complete by: As directed       Allergies as of 03/13/2023       Reactions   Contrast Media [iodinated Contrast Media] Hives        Medication List     STOP taking these medications    doxycycline 50 MG capsule Commonly known as: VIBRAMYCIN   furosemide 20 MG tablet Commonly known as: LASIX   nitrofurantoin (macrocrystal-monohydrate) 100 MG capsule Commonly known as: MACROBID       TAKE these medications    (feeding supplement) PROSource Plus liquid Take 30 mLs by mouth 3 (three) times daily between meals.   acidophilus Caps capsule Take 1 capsule by mouth daily.   albuterol 108 (90 Base) MCG/ACT inhaler Commonly known as: VENTOLIN HFA Inhale 1-2 puffs into the lungs every 6 (six) hours as needed for wheezing or shortness of breath.   Cranberry 50 MG Chew Chew 50 mg by mouth 2 (two) times daily.   estradiol 0.1 MG/GM vaginal cream Commonly known as: ESTRACE Place 1 Applicatorful vaginally every other day.   hydrocortisone 5 MG tablet Commonly known as: CORTEF Take 1 tablet (5 mg total) by mouth 3 (three) times daily.   lactulose 10 GM/15ML solution Commonly known as: CHRONULAC Take 45 mLs (30 g total) by mouth 3 (three) times daily. What changed: how much to take   levOCARNitine 330 MG tablet Commonly known as: CARNITOR Take 330 mg by mouth 3 (three) times daily.   liver oil-zinc oxide 40 % ointment Commonly known as: DESITIN Apply topically daily as  needed for irritation.   Multi-Vitamin  tablet Take 1 tablet by mouth daily.   polyethylene glycol 17 g packet Commonly known as: MIRALAX / GLYCOLAX Take 17 g by mouth daily as needed for mild constipation.   spironolactone 50 MG tablet Commonly known as: ALDACTONE Take 0.5 tablets (25 mg total) by mouth daily.   Synthroid 75 MCG tablet Generic drug: levothyroxine Take 1 tablet (75 mcg total) by mouth daily before breakfast. Must be seen in office for more refills. What changed: additional instructions   zinc gluconate 50 MG tablet Take 50 mg by mouth 3 (three) times daily.        Follow-up Information     Louanne Skye Devoria Albe., NP Follow up.   Specialty: Cardiology Why: Humberto Seals - [address] location - cardiology follow-up arranged on Friday Apr 02, 2023 at 10:55 AM (Arrive by 10:40 AM). Alden Server is one of our nurse practitioners with the [facility] team. Contact information: [address] [place] [address] [phone]         [facility] Follow up.   Specialty: Rehabilitation Why: they will call you to set up appt times, if you do not hear from themin 2 to 3 business days please give them a call Contact information: [address] [id number] mc [place] [address] [phone]        Eden Emms, NP Follow up in 1 week(s).   Specialties: Nurse Practitioner, Family Medicine Contact information: [address] [place] [address] [phone]                  Time coordinating discharge: 39 minutes  Signed:  Kieron Kantner  [facility] 03/13/2023, 2:29 PM

## 2023-03-15 ENCOUNTER — Telehealth: Payer: Self-pay | Admitting: *Deleted

## 2023-03-15 ENCOUNTER — Ambulatory Visit: Payer: Self-pay

## 2023-03-15 NOTE — Chronic Care Management (AMB) (Signed)
   03/15/2023  [name] [birth year] [id number]   Reason for Encounter: Patient is not currently enrolled in the CCM program. CCM status changed to previously enrolled  Alto Denver RN, MSN, CCM RN Care Manager  Chronic Care Management Direct Number: [phone]

## 2023-03-15 NOTE — Transitions of Care (Post Inpatient/ED Visit) (Signed)
   03/15/2023  Name: Corin Formisano MRN: [id number] DOB: 28-Sep-1951  Today's TOC FU Call Status: Today's TOC FU Call Status:: Unsuccessul Call (1st Attempt) Unsuccessful Call (1st Attempt) Date: 03/15/23  Attempted to reach the patient regarding the most recent Inpatient/ED visit.  Follow Up Plan: Additional outreach attempts will be made to reach the patient to complete the Transitions of Care (Post Inpatient/ED visit) call.   Gean Maidens BSN RN [facility] Care [facility] [phone]

## 2023-03-15 NOTE — Transitions of Care (Post Inpatient/ED Visit) (Signed)
   03/15/2023  Name: [name] MRN: [id number] DOB: 1951/05/09  Today's TOC FU Call Status: Today's TOC FU Call Status:: Successful TOC FU Call Competed TOC FU Call Complete Date: 03/15/23  Transition Care Management Follow-up Telephone Call Date of Discharge: 03/13/23 Discharge Facility[facility]) Type of Discharge: Inpatient Admission Primary Inpatient Discharge Diagnosis:: acute hepatic encephalopathy How have you been since you were released from the hospital?: Better Any questions or concerns?: Yes Patient Questions/Concerns:: Pt physical therapy was to be outpt and they were called to by pt to do in home. Patient Questions/Concerns Addressed: Notified Provider of Patient Questions/Concerns Physical therapy called patient and told her the order was for in home therapy and they needed another order for it to be done outpatient on [address].  Items Reviewed: Did you receive and understand the discharge instructions provided?: Yes Medications obtained and verified?: Yes (Medications Reviewed) Any new allergies since your discharge?: No Dietary orders reviewed?: No Do you have support at home?: Yes People in Home: spouse Name of Support/Comfort Primary Source: [facility] Care and Equipment/Supplies: Were Home Health Services Ordered?: NA Any new equipment or medical supplies ordered?: NA  Functional Questionnaire: Do you need assistance with bathing/showering or dressing?: No Do you need assistance with meal preparation?: Yes Do you need assistance with eating?: No Do you have difficulty maintaining continence: No Do you need assistance with getting out of bed/getting out of a chair/moving?: No Do you have difficulty managing or taking your medications?: Yes  Follow up appointments reviewed: PCP Follow-up appointment confirmed?: Yes Date of PCP follow-up appointment?: 03/19/23 Follow-up Provider: Mordecai Maes  2:40 Specialist [facility] Follow-up appointment  confirmed?: Yes Date of Specialist follow-up appointment?: 04/02/23 Follow-Up Specialty Provider:: Robin Searing Do you need transportation to your follow-up appointment?: No Do you understand care options if your condition(s) worsen?: Yes-patient verbalized understanding  SDOH Interventions Today    Flowsheet Row Most Recent Value  SDOH Interventions   Food Insecurity Interventions Intervention Not Indicated  Housing Interventions Intervention Not Indicated  Transportation Interventions Intervention Not Indicated      Interventions Today    Flowsheet Row Most Recent Value  General Interventions   General Interventions Discussed/Reviewed General Interventions Discussed, General Interventions Reviewed, Doctor Visits  Doctor Visits Discussed/Reviewed Doctor Visits Reviewed, Doctor Visits Discussed  Exercise Interventions   Exercise Discussed/Reviewed Exercise Discussed, Exercise Reviewed  [Patient is to start physical therapy]      TOC Interventions Today    Flowsheet Row Most Recent Value  TOC Interventions   TOC Interventions Discussed/Reviewed TOC Interventions Discussed, TOC Interventions Reviewed       Gean Maidens BSN RN [facility] Care [facility] [phone]

## 2023-03-16 ENCOUNTER — Other Ambulatory Visit: Payer: Self-pay | Admitting: Nurse Practitioner

## 2023-03-16 ENCOUNTER — Telehealth: Payer: Self-pay | Admitting: Pharmacy Technician

## 2023-03-16 DIAGNOSIS — K7469 Other cirrhosis of liver: Secondary | ICD-10-CM

## 2023-03-16 DIAGNOSIS — K7682 Hepatic encephalopathy: Secondary | ICD-10-CM | POA: Diagnosis not present

## 2023-03-16 DIAGNOSIS — I8510 Secondary esophageal varices without bleeding: Secondary | ICD-10-CM | POA: Diagnosis not present

## 2023-03-16 LAB — CULTURE, BLOOD (ROUTINE X 2): Special Requests: ADEQUATE

## 2023-03-16 NOTE — Telephone Encounter (Signed)
-----   Message from Loretha Stapler, RN sent at 03/02/2023  1:24 PM EDT ----- Regarding: RE: Xifaxan I have attached the PA team so that they can assist.  ----- Message ----- From: Sammuel Cooper, PA-C Sent: 03/02/2023   1:20 PM EDT To: Loretha Stapler, RN Subject: [name], this is a patient of Dr. Beavers-currently in the hospital with severe hepatic encephalopathy  They had tried to obtain Xifaxan in the past but was $1400 a month I believe Husband mentioned possibility of patient assistance program which had been mentioned by [facility] hepatology, can you investigate if there is any patient assistance programs that would help them get the Xifaxan 550 twice daily for a reasonable cost -thank you

## 2023-03-17 LAB — CULTURE, BLOOD (ROUTINE X 2)
Culture: NO GROWTH
Culture: NO GROWTH
Special Requests: ADEQUATE

## 2023-03-19 ENCOUNTER — Encounter: Payer: Self-pay | Admitting: Nurse Practitioner

## 2023-03-19 ENCOUNTER — Ambulatory Visit (INDEPENDENT_AMBULATORY_CARE_PROVIDER_SITE_OTHER): Payer: Medicare Other | Admitting: Nurse Practitioner

## 2023-03-19 VITALS — BP 112/67 | HR 51 | Temp 97.5°F | Resp 16 | Ht 64.0 in | Wt 159.1 lb

## 2023-03-19 DIAGNOSIS — R531 Weakness: Secondary | ICD-10-CM

## 2023-03-19 DIAGNOSIS — K7682 Hepatic encephalopathy: Secondary | ICD-10-CM | POA: Diagnosis not present

## 2023-03-19 DIAGNOSIS — E2740 Unspecified adrenocortical insufficiency: Secondary | ICD-10-CM | POA: Insufficient documentation

## 2023-03-19 DIAGNOSIS — K766 Portal hypertension: Secondary | ICD-10-CM | POA: Diagnosis not present

## 2023-03-19 NOTE — Assessment & Plan Note (Signed)
Suspected dx. Being treated with cortef 5mg  TID. Amb refer to endocrine placed today

## 2023-03-19 NOTE — Progress Notes (Signed)
Established Patient Office Visit  Subjective   Patient ID: [name], female    DOB: 1951-03-19  Age: 72 y.o. MRN: [id number]  Chief Complaint  Patient presents with   Hospitalization Follow-up    HPI  Hospital follow up: Patient reached out for advice via MyChart and was called by one of our nurses and instructed to go to the emergency department on request of my advice.  Patient went to the emergency department and was admitted on 02/25/2023 and discharged on 03/13/2023.  Patient's principal problem list acute hepatic encephalopathy does have a history of decompensated liver cirrhosis with portal hypertension and gastric varices and is followed by liver clinic. Patient was recommended a low-sodium heart healthy diet with a fluid restriction of 1500 mL/day per hospitalist notes that he thinks she is borderline adrenal insufficient patient underwent an ACTH stimulation test patient was put on Cortef 5 mg 3 times daily with improvement in blood pressure glucose levels and temperature at the hospital Since discharge patient has seen liver clinic and basic labs have been performed.  Patient is accompanied by her spouse today states they are doing better since discharge.  They were given Xifaxan antibiotic in the hospital seem to work well.  They got prescription from liver clinic but it was really expensive approximately $1200 with insurance.  They have applied for financial assistance through the drug company.  Patient is scheduled to have follow-up with cardiology given her bradycardia tachycardia syndrome.  Patient is bradycardic in office today but asymptomatic.  Patient was also found to have possible adrenal insufficiency she is taking Cortef 5 mg 3 times daily and needs a referral to endocrinology.  Patient also needs a new referral to outpatient PT as well in the hospital placed was not working per patient's family     Review of Systems  Constitutional:  Negative for chills and fever.   Respiratory:  Negative for shortness of breath.   Cardiovascular:  Positive for leg swelling. Negative for chest pain.  Gastrointestinal:  Negative for abdominal pain, constipation, diarrhea, nausea and vomiting.  Neurological:  Positive for weakness. Negative for headaches.      Objective:     BP 112/67   Pulse (!) 51   Temp (!) 97.5 F (36.4 C)   Resp 16   Ht 5\' 4"  (1.626 m)   Wt 159 lb 2 oz (72.2 kg)   SpO2 98%   BMI 27.31 kg/m  BP Readings from Last 3 Encounters:  03/19/23 112/67  03/13/23 97/60  02/24/23 110/62   Wt Readings from Last 3 Encounters:  03/19/23 159 lb 2 oz (72.2 kg)  03/13/23 156 lb 14.4 oz (71.2 kg)  02/24/23 136 lb 8 oz (61.9 kg)      Physical Exam Vitals and nursing note reviewed.  Constitutional:      Appearance: Normal appearance.  Cardiovascular:     Rate and Rhythm: Normal rate and regular rhythm.     Heart sounds: Normal heart sounds.  Pulmonary:     Effort: Pulmonary effort is normal.     Breath sounds: Normal breath sounds.  Musculoskeletal:     Right lower leg: Edema present.     Left lower leg: Edema present.  Neurological:     Mental Status: She is alert.      No results found for any visits on 03/19/23.    The ASCVD Risk score (Arnett DK, et al., 2019) failed to calculate for the following reasons:   Cannot find  a previous HDL lab   Cannot find a previous total cholesterol lab    Assessment & Plan:   Problem List Items Addressed This Visit       Cardiovascular and Mediastinum   Portal hypertension (HCC)    Followed by liver clinic she is on spirolactone       Relevant Orders   CBC   Comprehensive metabolic panel   Magnesium     Endocrine   Adrenal insufficiency (HCC)    Suspected dx. Being treated with cortef 5mg  TID. Amb refer to endocrine placed today       Relevant Orders   Ambulatory referral to Endocrinology   CBC   Comprehensive metabolic panel   Magnesium     Nervous and Auditory   Acute  hepatic encephalopathy (HCC) - Primary    Status post resolved with lactulose and Xifaxan      Relevant Orders   CBC   Comprehensive metabolic panel   Magnesium     Other   Weakness    Referral placed for outpt pt      Relevant Orders   Ambulatory referral to Physical Therapy    Return if symptoms worsen or fail to improve, for As scheduled .    Audria Nine, NP

## 2023-03-19 NOTE — Assessment & Plan Note (Signed)
Referral placed for outpt pt

## 2023-03-19 NOTE — Assessment & Plan Note (Signed)
Status post resolved with lactulose and Xifaxan

## 2023-03-19 NOTE — Assessment & Plan Note (Signed)
Followed by liver clinic she is on spirolactone

## 2023-03-19 NOTE — Patient Instructions (Signed)
Nice to see you today I will be in touch with the labs once I have reviewed them Keep your appointments as scheduled

## 2023-03-20 LAB — CBC
Hematocrit: 28.8 % — ABNORMAL LOW (ref 34.0–46.6)
Hemoglobin: 9.7 g/dL — ABNORMAL LOW (ref 11.1–15.9)
MCH: 34.9 pg — ABNORMAL HIGH (ref 26.6–33.0)
MCHC: 33.7 g/dL (ref 31.5–35.7)
MCV: 104 fL — ABNORMAL HIGH (ref 79–97)
Platelets: 129 10*3/uL — ABNORMAL LOW (ref 150–450)
RBC: 2.78 x10E6/uL — ABNORMAL LOW (ref 3.77–5.28)
RDW: 14.7 % (ref 11.7–15.4)
WBC: 2.5 10*3/uL — CL (ref 3.4–10.8)

## 2023-03-20 LAB — MAGNESIUM: Magnesium: 1.6 mg/dL (ref 1.6–2.3)

## 2023-03-20 LAB — COMPREHENSIVE METABOLIC PANEL
ALT: 49 IU/L — ABNORMAL HIGH (ref 0–32)
AST: 67 IU/L — ABNORMAL HIGH (ref 0–40)
Albumin/Globulin Ratio: 1.5 (ref 1.2–2.2)
Albumin: 3.9 g/dL (ref 3.8–4.8)
Alkaline Phosphatase: 325 IU/L — ABNORMAL HIGH (ref 44–121)
BUN/Creatinine Ratio: 18 (ref 12–28)
BUN: 14 mg/dL (ref 8–27)
Bilirubin Total: 1.1 mg/dL (ref 0.0–1.2)
CO2: 21 mmol/L (ref 20–29)
Calcium: 10.2 mg/dL (ref 8.7–10.3)
Chloride: 108 mmol/L — ABNORMAL HIGH (ref 96–106)
Creatinine, Ser: 0.79 mg/dL (ref 0.57–1.00)
Globulin, Total: 2.6 g/dL (ref 1.5–4.5)
Glucose: 75 mg/dL (ref 70–99)
Potassium: 4.3 mmol/L (ref 3.5–5.2)
Sodium: 143 mmol/L (ref 134–144)
Total Protein: 6.5 g/dL (ref 6.0–8.5)
eGFR: 80 mL/min/{1.73_m2} (ref >59)

## 2023-03-22 ENCOUNTER — Encounter: Payer: Self-pay | Admitting: Nurse Practitioner

## 2023-03-22 ENCOUNTER — Other Ambulatory Visit: Payer: Self-pay

## 2023-03-22 DIAGNOSIS — E039 Hypothyroidism, unspecified: Secondary | ICD-10-CM

## 2023-03-22 MED ORDER — SYNTHROID 75 MCG PO TABS
75.0000 ug | ORAL_TABLET | Freq: Every day | ORAL | 1 refills | Status: DC
Start: 2023-03-22 — End: 2023-09-17

## 2023-03-22 NOTE — Telephone Encounter (Signed)
RX request sent to provider.

## 2023-03-22 NOTE — Telephone Encounter (Signed)
SYNTHROID 75 MCG tablet LOV 03/19/2023

## 2023-03-24 ENCOUNTER — Ambulatory Visit (INDEPENDENT_AMBULATORY_CARE_PROVIDER_SITE_OTHER): Payer: Medicare Other

## 2023-03-24 VITALS — Ht 64.0 in | Wt 159.0 lb

## 2023-03-24 DIAGNOSIS — Z0000 Encounter for general adult medical examination without abnormal findings: Secondary | ICD-10-CM | POA: Diagnosis not present

## 2023-03-24 NOTE — Patient Instructions (Signed)
[name] , Thank you for taking time to come for your Medicare Wellness Visit. I appreciate your ongoing commitment to your health goals. Please review the following plan we discussed and let me know if I can assist you in the future.   These are the goals we discussed:  Goals      Advised patient to contact PCP office to schedule vaccines     DIET - EAT MORE FRUITS AND VEGETABLES     Patient Stated     01/24/2020, I will maintain and continue and medications as prescribed.     Patient Stated     Stay out of the hospital. Restart walking program     Patient Stated     Get rid of cane, wants left hand more useable.   Wants to be active     Track and Manage My Blood Pressure-Hypertension     Timeframe:  Long-Range Goal Priority:  Medium Start Date:     11/25/20                        Expected End Date:  11/25/21                     Follow Up Date July 2023   - check blood pressure daily - choose a place to take my blood pressure (home, clinic or office, retail store) - write blood pressure results in a log or diary    Why is this important?   You won't feel high blood pressure, but it can still hurt your blood vessels.  High blood pressure can cause heart or kidney problems. It can also cause a stroke.  Making lifestyle changes like losing a little weight or eating less salt will help.  Checking your blood pressure at home and at different times of the day can help to control blood pressure.  If the doctor prescribes medicine remember to take it the way the doctor ordered.  Call the office if you cannot afford the medicine or if there are questions about it.     Notes:         This is a list of the screening recommended for you and due dates:  Health Maintenance  Topic Date Due   DTaP/Tdap/Td vaccine (1 - Tdap) Never done   Zoster (Shingles) Vaccine (1 of 2) Never done   Pneumonia Vaccine (1 of 1 - PCV) Never done   Colon Cancer Screening  06/25/2020   Flu Shot  06/24/2023    Medicare Annual Wellness Visit  03/23/2024   Mammogram  11/02/2024   DEXA scan (bone density measurement)  Completed   Hepatitis C Screening: USPSTF Recommendation to screen - Ages 90-79 yo.  Completed   HPV Vaccine  Aged Out   COVID-19 Vaccine  Discontinued    Advanced directives:   Discussed with patient.   Conditions/risks identified: Keep up the good work  Next appointment: Follow up in one year for your annual wellness visit:   03/27/2024   Preventive Care 65 Years and Older, Female Preventive care refers to lifestyle choices and visits with your health care provider that can promote health and wellness. What does preventive care include? A yearly physical exam. This is also called an annual well check. Dental exams once or twice a year. Routine eye exams. Ask your health care provider how often you should have your eyes checked. Personal lifestyle choices, including: Daily care of your teeth and  gums. Regular physical activity. Eating a healthy diet. Avoiding tobacco and drug use. Limiting alcohol use. Practicing safe sex. Taking low-dose aspirin every day. Taking vitamin and mineral supplements as recommended by your health care provider. What happens during an annual well check? The services and screenings done by your health care provider during your annual well check will depend on your age, overall health, lifestyle risk factors, and family history of disease. Counseling  Your health care provider may ask you questions about your: Alcohol use. Tobacco use. Drug use. Emotional well-being. Home and relationship well-being. Sexual activity. Eating habits. History of falls. Memory and ability to understand (cognition). Work and work Astronomer. Reproductive health. Screening  You may have the following tests or measurements: Height, weight, and BMI. Blood pressure. Lipid and cholesterol levels. These may be checked every 5 years, or more frequently if you are  over 51 years old. Skin check. Lung cancer screening. You may have this screening every year starting at age 24 if you have a 30-pack-year history of smoking and currently smoke or have quit within the past 15 years. Fecal occult blood test (FOBT) of the stool. You may have this test every year starting at age 40. Flexible sigmoidoscopy or colonoscopy. You may have a sigmoidoscopy every 5 years or a colonoscopy every 10 years starting at age 53. Hepatitis C blood test. Hepatitis B blood test. Sexually transmitted disease (STD) testing. Diabetes screening. This is done by checking your blood sugar (glucose) after you have not eaten for a while (fasting). You may have this done every 1-3 years. Bone density scan. This is done to screen for osteoporosis. You may have this done starting at age 21. Mammogram. This may be done every 1-2 years. Talk to your health care provider about how often you should have regular mammograms. Talk with your health care provider about your test results, treatment options, and if necessary, the need for more tests. Vaccines  Your health care provider may recommend certain vaccines, such as: Influenza vaccine. This is recommended every year. Tetanus, diphtheria, and acellular pertussis (Tdap, Td) vaccine. You may need a Td booster every 10 years. Zoster vaccine. You may need this after age 91. Pneumococcal 13-valent conjugate (PCV13) vaccine. One dose is recommended after age 68. Pneumococcal polysaccharide (PPSV23) vaccine. One dose is recommended after age 59. Talk to your health care provider about which screenings and vaccines you need and how often you need them. This information is not intended to replace advice given to you by your health care provider. Make sure you discuss any questions you have with your health care provider. Document Released: 12/06/2015 Document Revised: 07/29/2016 Document Reviewed: 09/10/2015 Elsevier Interactive Patient Education  2017  [employer].  Fall Prevention in the Home Falls can cause injuries. They can happen to people of all ages. There are many things you can do to make your home safe and to help prevent falls. What can I do on the outside of my home? Regularly fix the edges of walkways and driveways and fix any cracks. Remove anything that might make you trip as you walk through a door, such as a raised step or threshold. Trim any bushes or trees on the path to your home. Use bright outdoor lighting. Clear any walking paths of anything that might make someone trip, such as rocks or tools. Regularly check to see if handrails are loose or broken. Make sure that both sides of any steps have handrails. Any raised decks and porches should  have guardrails on the edges. Have any leaves, snow, or ice cleared regularly. Use sand or salt on walking paths during winter. Clean up any spills in your garage right away. This includes oil or grease spills. What can I do in the bathroom? Use night lights. Install grab bars by the toilet and in the tub and shower. Do not use towel bars as grab bars. Use non-skid mats or decals in the tub or shower. If you need to sit down in the shower, use a plastic, non-slip stool. Keep the floor dry. Clean up any water that spills on the floor as soon as it happens. Remove soap buildup in the tub or shower regularly. Attach bath mats securely with double-sided non-slip rug tape. Do not have throw rugs and other things on the floor that can make you trip. What can I do in the bedroom? Use night lights. Make sure that you have a light by your bed that is easy to reach. Do not use any sheets or blankets that are too big for your bed. They should not hang down onto the floor. Have a firm chair that has side arms. You can use this for support while you get dressed. Do not have throw rugs and other things on the floor that can make you trip. What can I do in the kitchen? Clean up any spills  right away. Avoid walking on wet floors. Keep items that you use a lot in easy-to-reach places. If you need to reach something above you, use a strong step stool that has a grab bar. Keep electrical cords out of the way. Do not use floor polish or wax that makes floors slippery. If you must use wax, use non-skid floor wax. Do not have throw rugs and other things on the floor that can make you trip. What can I do with my stairs? Do not leave any items on the stairs. Make sure that there are handrails on both sides of the stairs and use them. Fix handrails that are broken or loose. Make sure that handrails are as long as the stairways. Check any carpeting to make sure that it is firmly attached to the stairs. Fix any carpet that is loose or worn. Avoid having throw rugs at the top or bottom of the stairs. If you do have throw rugs, attach them to the floor with carpet tape. Make sure that you have a light switch at the top of the stairs and the bottom of the stairs. If you do not have them, ask someone to add them for you. What else can I do to help prevent falls? Wear shoes that: Do not have high heels. Have rubber bottoms. Are comfortable and fit you well. Are closed at the toe. Do not wear sandals. If you use a stepladder: Make sure that it is fully opened. Do not climb a closed stepladder. Make sure that both sides of the stepladder are locked into place. Ask someone to hold it for you, if possible. Clearly mark and make sure that you can see: Any grab bars or handrails. First and last steps. Where the edge of each step is. Use tools that help you move around (mobility aids) if they are needed. These include: Canes. Walkers. Scooters. Crutches. Turn on the lights when you go into a dark area. Replace any light bulbs as soon as they burn out. Set up your furniture so you have a clear path. Avoid moving your furniture around. If any of your  floors are uneven, fix them. If there are  any pets around you, be aware of where they are. Review your medicines with your doctor. Some medicines can make you feel dizzy. This can increase your chance of falling. Ask your doctor what other things that you can do to help prevent falls. This information is not intended to replace advice given to you by your health care provider. Make sure you discuss any questions you have with your health care provider. Document Released: 09/05/2009 Document Revised: 04/16/2016 Document Reviewed: 12/14/2014 Elsevier Interactive Patient Education  2017 [employer].

## 2023-03-24 NOTE — Progress Notes (Signed)
Subjective:   [name] is a 72 y.o. female who presents for Medicare Annual (Subsequent) preventive examination.  I connected with  Liberty Handy on 03/24/23 by a audio enabled telemedicine application and verified that I am speaking with the correct person using two identifiers.  Patient Location: Home  Provider Location: Home Office  I discussed the limitations of evaluation and management by telemedicine. The patient expressed understanding and agreed to proceed.        Objective:    There were no vitals filed for this visit. There is no height or weight on file to calculate BMI.     02/26/2023   12:47 AM 02/09/2023   10:34 AM 04/30/2022    4:11 PM 04/30/2022   12:46 PM 04/01/2022    3:11 PM 02/02/2022    4:38 PM 01/30/2021    3:00 PM  Advanced Directives  Does Patient Have a Medical Advance Directive? No No No No No No No  Would patient like information on creating a medical advance directive? No - Patient declined No - Patient declined No - Patient declined  No - Patient declined No - Patient declined Yes (MAU/Ambulatory/Procedural Areas - Information given)    Current Medications (verified) Outpatient Encounter Medications as of 03/24/2023  Medication Sig   acidophilus (RISAQUAD) CAPS capsule Take 1 capsule by mouth daily.   albuterol (VENTOLIN HFA) 108 (90 Base) MCG/ACT inhaler Inhale 1-2 puffs into the lungs every 6 (six) hours as needed for wheezing or shortness of breath.   Cranberry 50 MG CHEW Chew 50 mg by mouth 2 (two) times daily.   estradiol (ESTRACE) 0.1 MG/GM vaginal cream Place 1 Applicatorful vaginally every other day.   hydrocortisone (CORTEF) 5 MG tablet Take 1 tablet (5 mg total) by mouth 3 (three) times daily.   lactulose (CHRONULAC) 10 GM/15ML solution Take 45 mLs (30 g total) by mouth 3 (three) times daily.   levOCARNitine (CARNITOR) 330 MG tablet Take 330 mg by mouth 3 (three) times daily.   liver oil-zinc oxide (DESITIN) 40 % ointment Apply  topically daily as needed for irritation.   Multiple Vitamin (MULTI-VITAMIN) tablet Take 1 tablet by mouth daily.   Nutritional Supplements (,FEEDING SUPPLEMENT, PROSOURCE PLUS) liquid Take 30 mLs by mouth 3 (three) times daily between meals.   polyethylene glycol (MIRALAX / GLYCOLAX) 17 g packet Take 17 g by mouth daily as needed for mild constipation.   spironolactone (ALDACTONE) 50 MG tablet Take 0.5 tablets (25 mg total) by mouth daily.   SYNTHROID 75 MCG tablet Take 1 tablet (75 mcg total) by mouth daily before breakfast. Must be seen in office for more refills.   zinc gluconate 50 MG tablet Take 50 mg by mouth 3 (three) times daily.   No facility-administered encounter medications on file as of 03/24/2023.    Allergies (verified) Contrast media [iodinated contrast media]   History: Past Medical History:  Diagnosis Date   Acute urinary retention 05/04/2022   Allergy 2006 ?   Contrast dye   Arthritis 2016 ??   Knees and thumb   Cancer [facility])    cecum   Cataract 2021   Surgery scheduled July 2023   Colon cancer [facility]) 2003   Elevated liver function tests    Esophageal varices (HCC)    Heart murmur On file   Hemorrhage of gastrointestinal tract 05/04/2011   Hypertension 2021   Hypothyroidism    Iron deficiency anemia    Liver disease    chemotherapy complication,  per pt, shunts placed to bypass liver   Malignant neoplasm of cecum (HCC)    Portal hypertension (HCC)    Skin cancer 2019   Splenomegaly    Past Surgical History:  Procedure Laterality Date   COLON SURGERY  2004   Cancer   COSMETIC SURGERY  2021   Skin cancer   ESOPHAGEAL VARICE LIGATION     EYE SURGERY     HEMICOLECTOMY  01/08/2003   IR RADIOLOGIST EVAL & MGMT  12/20/2020   IR RADIOLOGIST EVAL & MGMT  05/29/2021   LIVER SURGERY     shunts placed after chemo complication   SKIN FULL THICKNESS GRAFT N/A 09/12/2019   Procedure: debridement and FTSG to the nose from left upper arm;  Surgeon: Allena Napoleon, MD;  Location:  SURGERY CENTER;  Service: Plastics;  Laterality: N/A;  2 hours, please   TIPS PROCEDURE     Family History  Problem Relation Age of Onset   Arthritis Mother    Hearing loss Mother    Heart disease Mother    Hypertension Mother    Miscarriages / [place] Mother    Arthritis Father    Diabetes Father    Heart disease Father    Cancer Maternal Aunt    Breast cancer Neg Hx    Colon cancer Neg Hx    Esophageal cancer Neg Hx    Pancreatic cancer Neg Hx    Stomach cancer Neg Hx    Social History   Socioeconomic History   Marital status: Married    Spouse name: Animal nutritionist   Number of children: 2   Years of education: Not on file   Highest education level: Not on file  Occupational History   Occupation: Librarian  Tobacco Use   Smoking status: Never   Smokeless tobacco: Never   Tobacco comments:    Never smoked  Vaping Use   Vaping Use: Never used  Substance and Sexual Activity   Alcohol use: Never   Drug use: Never   Sexual activity: Not Currently    Partners: Male  Other Topics Concern   Not on file  Social History Narrative   12/04/20   From: the area   Living: with husband, [name] (1994)   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      Family: 2 children Designer, fashion/clothing and Optometrist - 2 grandchildren - nearby      Enjoys: read      Exercise: trying to get back to exercise   Diet: healthy, limits fast foods      Safety   Seat belts: Yes    Guns: Yes  and secure   Safe in relationships: Yes    Social Determinants of Health   Financial Resource Strain: Low Risk  (04/01/2022)   Overall Financial Resource Strain (CARDIA)    Difficulty of Paying Living Expenses: Not hard at all  Food Insecurity: No Food Insecurity (03/15/2023)   Hunger Vital Sign    Worried About Running Out of Food in the Last Year: Never true    Ran Out of Food in the Last Year: Never true  Transportation Needs: No Transportation Needs (03/15/2023)   PRAPARE -  Administrator, Civil Service (Medical): No    Lack of Transportation (Non-Medical): No  Physical Activity: Insufficiently Active (04/01/2022)   Exercise Vital Sign    Days of Exercise per Week: 2 days    Minutes of Exercise per Session: 20 min  Stress: No Stress  Concern Present (04/01/2022)   [employer] of Occupational Health - Occupational Stress Questionnaire    Feeling of Stress : Only a little  Social Connections: Moderately Integrated (04/01/2022)   Social Connection and Isolation Panel [NHANES]    Frequency of Communication with Friends and Family: More than three times a week    Frequency of Social Gatherings with Friends and Family: More than three times a week    Attends Religious Services: More than 4 times per year    Active Member of [employer] or Organizations: No    Attends Banker Meetings: Never    Marital Status: Married    Tobacco Counseling Counseling given: Not Answered Tobacco comments: Never smoked   Clinical Intake:              How often do you need to have someone help you when you read instructions, pamphlets, or other written materials from your doctor or pharmacy?: (P) 1 - Never  Diabetic? No         Activities of Daily Living    03/22/2023    1:54 PM 02/26/2023   12:44 AM  In your present state of health, do you have any difficulty performing the following activities:  Hearing? 0   Vision? 0   Difficulty concentrating or making decisions? 0   Dressing or bathing? 0   Doing errands, shopping? 1 0  Preparing Food and eating ? N   Using the Toilet? N   In the past six months, have you accidently leaked urine? N   Do you have problems with loss of bowel control? N   Managing your Finances? Y     Patient Care Team: Eden Emms, NP as PCP - General (Nurse Practitioner) Tressia Danas, MD as Consulting Physician (Gastroenterology) Annamarie Major, CRNP as Nurse Practitioner (Nurse Practitioner) Kathyrn Sheriff, [facility] as Pharmacist (Pharmacist)  Indicate any recent Medical Services you may have received from other than [facility] providers in the past year (date may be approximate).     Assessment:   This is a routine wellness examination for [facility].  Hearing/Vision screen No results found.  Dietary issues and exercise activities discussed:     Goals Addressed   None    Depression Screen    02/24/2023    8:14 AM 02/04/2023    9:41 AM 01/04/2023    4:47 PM 04/01/2022    3:09 PM 02/09/2022    2:07 PM 01/30/2021    3:42 PM 01/30/2021    3:04 PM  PHQ 2/9 Scores  PHQ - 2 Score 0 0 0 0 0 0 0  PHQ- 9 Score 2 2 3         Fall Risk    03/22/2023    1:54 PM 02/24/2023    8:14 AM 02/04/2023    9:41 AM 01/04/2023    3:52 PM 04/01/2022    3:11 PM  Fall Risk   Falls in the past year? 1 1 1 1  0  Number falls in past yr: 1 1 1  0 0  Injury with Fall? 1 1 1  0 0  Risk for fall due to :  Impaired balance/gait  No Fall Risks History of fall(s)  Follow up  Falls evaluation completed Falls evaluation completed Falls evaluation completed Falls prevention discussed    FALL RISK PREVENTION PERTAINING TO THE HOME:  Any stairs in or around the home? No  If so, are there any without handrails? No  Home free of loose throw  rugs in walkways, pet beds, electrical cords, etc? Yes  Adequate lighting in your home to reduce risk of falls? Yes   ASSISTIVE DEVICES UTILIZED TO PREVENT FALLS:  Life alert? No  Use of a cane, walker or w/c? Yes  Grab bars in the bathroom? No  Shower chair or bench in shower? Yes  Elevated toilet seat or a handicapped toilet? No   TIMED UP AND GO:  Was the test performed? No .  Televisit  Cognitive Function:    01/24/2020    3:38 PM  MMSE - Mini Mental State Exam  Orientation to time 5  Orientation to Place 5  Registration 3  Attention/ Calculation 5  Recall 3  Language- repeat 1        04/01/2022    3:13 PM  6CIT Screen  What Year? 0 points  What month? 0 points   What time? 0 points  Count back from 20 0 points  Months in reverse 0 points  Repeat phrase 0 points  Total Score 0 points    Immunizations  There is no immunization history on file for this patient.  TDAP status: Due, Education has been provided regarding the importance of this vaccine. Advised may receive this vaccine at local pharmacy or Health Dept. Aware to provide a copy of the vaccination record if obtained from local pharmacy or Health Dept. Verbalized acceptance and understanding.  Flu Vaccine status: Up to date  Pneumococcal vaccine status: Due, Education has been provided regarding the importance of this vaccine. Advised may receive this vaccine at local pharmacy or Health Dept. Aware to provide a copy of the vaccination record if obtained from local pharmacy or Health Dept. Verbalized acceptance and understanding.  Covid-19 vaccine status: Completed vaccines  Qualifies for Shingles Vaccine? Yes   Zostavax completed No   Shingrix Completed?: Yes  Screening Tests Health Maintenance  Topic Date Due   DTaP/Tdap/Td (1 - Tdap) Never done   Zoster Vaccines- Shingrix (1 of 2) Never done   Pneumonia Vaccine 49+ Years old (1 of 1 - PCV) Never done   COLONOSCOPY (Pts 45-20yrs Insurance coverage will need to be confirmed)  06/25/2020   Medicare Annual Wellness (AWV)  04/02/2023   INFLUENZA VACCINE  06/24/2023   MAMMOGRAM  11/02/2024   DEXA SCAN  Completed   Hepatitis C Screening  Completed   HPV VACCINES  Aged Out   COVID-19 Vaccine  Discontinued    Health Maintenance  Health Maintenance Due  Topic Date Due   DTaP/Tdap/Td (1 - Tdap) Never done   Zoster Vaccines- Shingrix (1 of 2) Never done   Pneumonia Vaccine 28+ Years old (1 of 1 - PCV) Never done   COLONOSCOPY (Pts 45-28yrs Insurance coverage will need to be confirmed)  06/25/2020   Medicare Annual Wellness (AWV)  04/02/2023    Colorectal cancer screening: Type of screening: Colonoscopy. Completed 06/25/2010.  Repeat every 10 years.    Patient has an appt with Gastroenterology 04/20/23  Mammogram status: Completed  . Repeat every year  Bone Density status: Completed  . Results reflect: Bone density results: OSTEOPENIA. Repeat every 2 years.  Lung Cancer Screening: (Low Dose CT Chest recommended if Age 12-80 years, 30 pack-year currently smoking OR have quit w/in 15years.) does not qualify.   Lung Cancer Screening Referral: N/A  Additional Screening:  Hepatitis C Screening: does qualify; Completed 05/14/2011  Vision Screening: Recommended annual ophthalmology exams for early detection of glaucoma and other disorders of the eye. Is the patient  up to date with their annual eye exam?  Yes   Who is the provider or what is the name of the office in which the patient attends annual eye exams? Sept 2023 with Dr. Shea Evans If pt is not established with a provider, would they like to be referred to a provider to establish care? No .   Dental Screening: Recommended annual dental exams for proper oral hygiene  Community Resource Referral / Chronic Care Management: CRR required this visit?  No   CCM required this visit?  No      Plan:     I have personally reviewed and noted the following in the patient's chart:   Medical and social history Use of alcohol, tobacco or illicit drugs  Current medications and supplements including opioid prescriptions. Patient is not currently taking opioid prescriptions. Functional ability and status Nutritional status Physical activity Advanced directives List of other physicians Hospitalizations, surgeries, and ER visits in previous 12 months Vitals Screenings to include cognitive, depression, and falls Referrals and appointments  In addition, I have reviewed and discussed with patient certain preventive protocols, quality metrics, and best practice recommendations. A written personalized care plan for preventive services as well as general preventive health  recommendations were provided to patient.     Milus Mallick, CMA   03/24/2023   Nurse Notes: Discussed updated vaccines.   Will discuss Colonoscopy at GI office appointment on 04/20/2023

## 2023-04-01 NOTE — Progress Notes (Signed)
Office Visit    Patient Name: [name] Date of Encounter: 04/02/2023  Primary Care Provider:  Eden Emms, NP Primary Cardiologist:  None Primary Electrophysiologist: None   Past Medical History    Past Medical History:  Diagnosis Date   Acute urinary retention 05/04/2022   Allergy 2006 ?   Contrast dye   Arthritis 2016 ??   Knees and thumb   Cancer (HCC)    cecum   Cataract 2021   Surgery scheduled July 2023   Colon cancer [facility]) 2003   Elevated liver function tests    Esophageal varices (HCC)    Heart murmur On file   Hemorrhage of gastrointestinal tract 05/04/2011   Hypertension 2021   Hypothyroidism    Iron deficiency anemia    Liver disease    chemotherapy complication, per pt, shunts placed to bypass liver   Malignant neoplasm of cecum (HCC)    Portal hypertension (HCC)    Skin cancer 2019   Splenomegaly    Past Surgical History:  Procedure Laterality Date   COLON SURGERY  2004   Cancer   COSMETIC SURGERY  2021   Skin cancer   ESOPHAGEAL VARICE LIGATION     EYE SURGERY     HEMICOLECTOMY  01/08/2003   IR RADIOLOGIST EVAL & MGMT  12/20/2020   IR RADIOLOGIST EVAL & MGMT  05/29/2021   LIVER SURGERY     shunts placed after chemo complication   SKIN FULL THICKNESS GRAFT N/A 09/12/2019   Procedure: debridement and FTSG to the nose from left upper arm;  Surgeon: Allena Napoleon, MD;  Location: [facility];  Service: Plastics;  Laterality: N/A;  2 hours, please   TIPS PROCEDURE      Allergies  Allergies  Allergen Reactions   Contrast Media [Iodinated Contrast Media] Hives     History of Present Illness    [name]  is a 72 year old female with a PMH of colon cancer s/p colectomy with chemotherapy in 2003, hypothyroidism, HTN, nonalcoholic cirrhosis with esophageal varices s/p TIPS in 2012, hepatic encephalopathy due to COVID-19 infection 07/2020 who presents today for follow-up of SVT and bradycardia.  [name] was  seen initially for consult due to SVT and bradycardia during recent hospitalization for acute hepatic encephalopathy.  She was originally admitted 02/26/2023 after presenting with altered mental status and ammonia levels were found to be elevated at 163. She improved with lactulose and patient developed brief runs of SVT with rates in the 130s to 140s.  She also experienced bradycardia with pauses.  She then started having bradycardia with rates in the 30s and was given atropine and transferred to [facility] for management in the ICU.  She was given a bolus of amiodarone but was deemed not a candidate for long-term dosing due to history of cirrhosis.  [name] presents today with her husband for posthospital follow-up.  Since last being seen in the office patient reports that she is feeling much better and has not experienced any presyncope or palpitations since her hospitalization.  Her blood pressure today is controlled at 122/60 and heart rate is 77 bpm.  She reports increased fatigue that has occurred prior to her recent hospitalization.  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  Her husband reports that her appetite is improved and she is doing well and eating a balanced diet.  She is tolerating her current medications without any adverse reactions.  She is tolerating her current medications without any adverse reactions.  She is scheduled to start physical therapy next week and is looking forward to increasing her physical activity.  She is euvolemic on exam today with no shortness of breath at rest or with exertion.  Patient denies chest pain, palpitations, dyspnea, PND, orthopnea, nausea, vomiting, dizziness, syncope, edema, weight gain, or early satiety.   Home Medications    Current Outpatient Medications  Medication Sig Dispense Refill   acidophilus (RISAQUAD) CAPS capsule Take 1 capsule by mouth daily.     Cranberry 50 MG CHEW Chew 50 mg by mouth 2 (two) times daily.      estradiol (ESTRACE) 0.1 MG/GM vaginal cream Place 1 Applicatorful vaginally every other day.     hydrocortisone (CORTEF) 5 MG tablet Take 1 tablet (5 mg total) by mouth 3 (three) times daily. 90 tablet 0   lactulose (CHRONULAC) 10 GM/15ML solution Take 45 mLs (30 g total) by mouth 3 (three) times daily. 236 mL 0   levOCARNitine (CARNITOR) 330 MG tablet Take 330 mg by mouth 3 (three) times daily.     liver oil-zinc oxide (DESITIN) 40 % ointment Apply topically daily as needed for irritation. 56.7 g 0   Multiple Vitamin (MULTI-VITAMIN) tablet Take 1 tablet by mouth daily.     Nutritional Supplements (,FEEDING SUPPLEMENT, PROSOURCE PLUS) liquid Take 30 mLs by mouth 3 (three) times daily between meals.     polyethylene glycol (MIRALAX / GLYCOLAX) 17 g packet Take 17 g by mouth daily as needed for mild constipation. 14 each 0   spironolactone (ALDACTONE) 50 MG tablet Take 0.5 tablets (25 mg total) by mouth daily.     SYNTHROID 75 MCG tablet Take 1 tablet (75 mcg total) by mouth daily before breakfast. Must be seen in office for more refills. 90 tablet 1   No current facility-administered medications for this visit.     Review of Systems  Please see the history of present illness.    (+) Fatigue (+) Balance issues  All other systems reviewed and are otherwise negative except as noted above.  Physical Exam    Wt Readings from Last 3 Encounters:  04/02/23 155 lb 3.2 oz (70.4 kg)  03/24/23 159 lb (72.1 kg)  03/19/23 159 lb 2 oz (72.2 kg)   VS: Vitals:   04/02/23 1041  BP: 122/60  Pulse: 77  SpO2: 98%  ,Body mass index is 26.64 kg/m.  Constitutional:      Appearance: Healthy appearance. Not in distress.  Neck:     Vascular: JVD normal.  Pulmonary:     Effort: Pulmonary effort is normal.     Breath sounds: No wheezing. No rales. Diminished in the bases Cardiovascular:     Normal rate. Regular rhythm. Normal S1. Normal S2.      Murmurs: There is no murmur.  Edema:    Peripheral  edema absent.  Abdominal:     Palpations: Abdomen is soft non tender. There is no hepatomegaly.  Skin:    General: Skin is warm and dry.  Neurological:     General: No focal deficit present.     Mental Status: Alert and oriented to person, place and time.     Cranial Nerves: Cranial nerves are intact.  EKG/LABS/ Recent Cardiac Studies    ECG personally reviewed by me today -none completed today  Cardiac Studies & Procedures       ECHOCARDIOGRAM  ECHOCARDIOGRAM COMPLETE 03/03/2023  Narrative ECHOCARDIOGRAM REPORT  Patient Name:   KHANIYA KUST Date of Exam: 03/03/2023 Medical Rec #:  [id number]       Height:       64.0 in Accession #:    [id number]      Weight:       138.0 lb Date of Birth:  [birth year]        BSA:          1.671 m Patient Age:    71 years        BP:           100/57 mmHg Patient Gender: F               HR:           52 bpm. Exam Location:  Inpatient  Procedure: 2D Echo, Cardiac Doppler and Color Doppler  Indications:    Other cardiac sounds R01.2  History:        Patient has prior history of Echocardiogram examinations, most recent 08/16/2020. Cancer, Arrythmias:Bradycardia, Signs/Symptoms:Murmur; Risk Factors:Hypertension.  Sonographer:    Aron Baba Referring Phys: [id number] RIPUDEEP K RAI   Sonographer Comments: Image acquisition challenging due to patient body habitus and Image acquisition challenging due to respiratory motion. IMPRESSIONS   1. Descending thoracic aorta dilated (4.1 cm); suggest CTA to better assess. 2. Left ventricular ejection fraction, by estimation, is 60 to 65%. The left ventricle has normal function. The left ventricle has no regional wall motion abnormalities. Left ventricular diastolic parameters are consistent with Grade I diastolic dysfunction (impaired relaxation). Elevated left atrial pressure. 3. Right ventricular systolic function is normal. The right ventricular size is mildly enlarged. There is mildly elevated  pulmonary artery systolic pressure. 4. Left atrial size was moderately dilated. 5. The mitral valve is normal in structure. Trivial mitral valve regurgitation. No evidence of mitral stenosis. Moderate mitral annular calcification. 6. The aortic valve is tricuspid. Aortic valve regurgitation is not visualized. Aortic valve sclerosis is present, with no evidence of aortic valve stenosis. 7. The inferior vena cava is dilated in size with <50% respiratory variability, suggesting right atrial pressure of 15 mmHg.  FINDINGS Left Ventricle: Left ventricular ejection fraction, by estimation, is 60 to 65%. The left ventricle has normal function. The left ventricle has no regional wall motion abnormalities. The left ventricular internal cavity size was normal in size. There is no left ventricular hypertrophy. Left ventricular diastolic parameters are consistent with Grade I diastolic dysfunction (impaired relaxation). Elevated left atrial pressure.  Right Ventricle: The right ventricular size is mildly enlarged. Right ventricular systolic function is normal. There is mildly elevated pulmonary artery systolic pressure. The tricuspid regurgitant velocity is 2.57 m/s, and with an assumed right atrial pressure of 15 mmHg, the estimated right ventricular systolic pressure is 41.4 mmHg.  Left Atrium: Left atrial size was moderately dilated.  Right Atrium: Right atrial size was normal in size.  Pericardium: There is no evidence of pericardial effusion.  Mitral Valve: The mitral valve is normal in structure. Moderate mitral annular calcification. Trivial mitral valve regurgitation. No evidence of mitral valve stenosis.  Tricuspid Valve: The tricuspid valve is normal in structure. Tricuspid valve regurgitation is mild . No evidence of tricuspid stenosis.  Aortic Valve: The aortic valve is tricuspid. Aortic valve regurgitation is not visualized. Aortic valve sclerosis is present, with no evidence of aortic valve  stenosis.  Pulmonic Valve: The pulmonic valve was normal in structure. Pulmonic valve regurgitation is trivial. No evidence of pulmonic stenosis.  Aorta: The aortic  root is normal in size and structure.  Venous: The inferior vena cava is dilated in size with less than 50% respiratory variability, suggesting right atrial pressure of 15 mmHg.  IAS/Shunts: No atrial level shunt detected by color flow Doppler.  Additional Comments: Descending thoracic aorta dilated (4.1 cm); suggest CTA to better assess.   LEFT VENTRICLE PLAX 2D LVIDd:         4.90 cm   Diastology LVIDs:         3.40 cm   LV e' medial:    6.53 cm/s LV PW:         1.30 cm   LV E/e' medial:  18.4 LV IVS:        0.60 cm   LV e' lateral:   7.18 cm/s LVOT diam:     1.50 cm   LV E/e' lateral: 16.7 LV SV:         64 LV SV Index:   38 LVOT Area:     1.77 cm   RIGHT VENTRICLE RV S prime:     10.90 cm/s TAPSE (M-mode): 2.4 cm  LEFT ATRIUM             Index        RIGHT ATRIUM           Index LA diam:        3.80 cm 2.27 cm/m   RA Area:     15.50 cm LA Vol (A2C):   74.8 ml 44.76 ml/m  RA Volume:   37.00 ml  22.14 ml/m LA Vol (A4C):   77.4 ml 46.32 ml/m LA Biplane Vol: 82.7 ml 49.49 ml/m AORTIC VALVE             PULMONIC VALVE LVOT Vmax:   140.00 cm/s PR End Diast Vel: 1.05 msec LVOT Vmean:  92.700 cm/s LVOT VTI:    0.361 m  AORTA Ao Root diam: 3.10 cm Ao Asc diam:  2.90 cm  MITRAL VALVE                TRICUSPID VALVE MV Area (PHT): 2.42 cm     TR Peak grad:   26.4 mmHg MV Decel Time: 313 msec     TR Vmax:        257.00 cm/s MV E velocity: 120.00 cm/s MV A velocity: 126.00 cm/s  SHUNTS MV E/A ratio:  0.95         Systemic VTI:  0.36 m Systemic Diam: 1.50 cm  Olga Millers MD Electronically signed by Olga Millers MD Signature Date/Time: 03/03/2023/3:21:44 PM    Final              Lab Results  Component Value Date   WBC 2.5 (LL) 03/19/2023   HGB 9.7 (L) 03/19/2023   HCT 28.8 (L) 03/19/2023    MCV 104 (H) 03/19/2023   PLT 129 (L) 03/19/2023   Lab Results  Component Value Date   CREATININE 0.79 03/19/2023   BUN 14 03/19/2023   NA 143 03/19/2023   K 4.3 03/19/2023   CL 108 (H) 03/19/2023   CO2 21 03/19/2023   Lab Results  Component Value Date   ALT 49 (H) 03/19/2023   AST 67 (H) 03/19/2023   ALKPHOS 325 (H) 03/19/2023   BILITOT 1.1 03/19/2023   Lab Results  Component Value Date   CHOL 159 09/14/2018   HDL 73.60 09/14/2018   LDLCALC 77 09/14/2018   TRIG 83 08/12/2020   CHOLHDL 2 09/14/2018  Lab Results  Component Value Date   HGBA1C 5.1 08/14/2020     Assessment & Plan    1.  Supraventricular tachycardia: -Patient developed intermittent run of SVT while being treated for hepatic encephalopathy.  Patient reports no recurrence of palpitations or tachycardia since hospitalization. -Patient will wear 14-day ZIO monitor to evaluate for arrhythmias -We discussed the importance abstaining from triggers for palpitations such as caffeine, alcohol, emotional stress.  2.  Acute hepatic encephalopathy: -Patient's ammonia levels during admission were 163 and improved to 40 on lactulose. -Today she is tolerating her lactulose with no evidence of constipation or diarrhea. -She is coherent and reports no episodes of confusion since her hospitalization. -She is currently following a 60 g protein diet per gastroenterologist  3.  Liver cirrhosis with portal hypertension: -s/p TIPS in 2012 at [facility]. -Continues to be monitored by [facility]  4.  Essential hypertension: -Patient's blood pressure today was well-controlled today at 122/60 -Continue spironolactone 25 mg daily  5.  Right lower lobe consolidation: -Patient had right lower lobe consolidation on auscultation today and reports some congestion with nonproductive cough. -She denies any fever or chills currently. -We will obtain a chest x-ray to rule out possible pneumonia.     Disposition: Follow-up with None or  APP in 3 months    Medication Adjustments/Labs and Tests Ordered: Current medicines are reviewed at length with the patient today.  Concerns regarding medicines are outlined above.   Signed, Napoleon Form, Leodis Rains, NP 04/02/2023, 11:29 AM [facility]

## 2023-04-02 ENCOUNTER — Ambulatory Visit
Admission: RE | Admit: 2023-04-02 | Discharge: 2023-04-02 | Disposition: A | Discharge status: Home / Self Care | Payer: Medicare Other | Source: Ambulatory Visit | Attending: Nurse Practitioner | Admitting: Nurse Practitioner

## 2023-04-02 ENCOUNTER — Ambulatory Visit: Payer: Medicare Other | Attending: Nurse Practitioner | Admitting: Nurse Practitioner

## 2023-04-02 ENCOUNTER — Ambulatory Visit (INDEPENDENT_AMBULATORY_CARE_PROVIDER_SITE_OTHER): Payer: Medicare Other

## 2023-04-02 ENCOUNTER — Encounter: Payer: Self-pay | Admitting: Nurse Practitioner

## 2023-04-02 VITALS — BP 122/60 | HR 77 | Ht 64.0 in | Wt 155.2 lb

## 2023-04-02 DIAGNOSIS — I10 Essential (primary) hypertension: Secondary | ICD-10-CM | POA: Diagnosis not present

## 2023-04-02 DIAGNOSIS — J181 Lobar pneumonia, unspecified organism: Secondary | ICD-10-CM

## 2023-04-02 DIAGNOSIS — K766 Portal hypertension: Secondary | ICD-10-CM

## 2023-04-02 DIAGNOSIS — K7682 Hepatic encephalopathy: Secondary | ICD-10-CM | POA: Diagnosis not present

## 2023-04-02 DIAGNOSIS — I471 Supraventricular tachycardia, unspecified: Secondary | ICD-10-CM

## 2023-04-02 LAB — CBC
Hematocrit: 32.3 % — ABNORMAL LOW (ref 34.0–46.6)
Hemoglobin: 11.1 g/dL (ref 11.1–15.9)
MCH: 33.8 pg — ABNORMAL HIGH (ref 26.6–33.0)
MCHC: 34.4 g/dL (ref 31.5–35.7)
MCV: 99 fL — ABNORMAL HIGH (ref 79–97)
Platelets: 98 10*3/uL — CL (ref 150–450)
RBC: 3.28 x10E6/uL — ABNORMAL LOW (ref 3.77–5.28)
RDW: 15.8 % — ABNORMAL HIGH (ref 11.7–15.4)
WBC: 4.6 10*3/uL (ref 3.4–10.8)

## 2023-04-02 NOTE — Patient Instructions (Signed)
Medication Instructions:  Your physician recommends that you continue on your current medications as directed. Please refer to the Current Medication list given to you today. *If you need a refill on your cardiac medications before your next appointment, please call your pharmacy*   Lab Work: [facility]  If you have labs (blood work) drawn today and your tests are completely normal, you will receive your results only by: MyChart Message (if you have MyChart) OR A paper copy in the mail If you have any lab test that is abnormal or we need to change your treatment, we will call you to review the results.   Testing/Procedures: A chest x-ray takes a picture of the organs and structures inside the chest, including the heart, lungs, and blood vessels. This test can show several things, including, whether the heart is enlarges; whether fluid is building up in the lungs; and whether pacemaker / defibrillator leads are still in place.   ZIO XT- Long Term Monitor Instructions  Your physician has requested you wear a ZIO patch monitor for 14 days.  This is a single patch monitor. [employer] supplies one patch monitor per enrollment. Additional stickers are not available. Please do not apply patch if you will be having a Nuclear Stress Test,  Echocardiogram, Cardiac CT, MRI, or Chest Xray during the period you would be wearing the  monitor. The patch cannot be worn during these tests. You cannot remove and re-apply the  ZIO XT patch monitor.  Your ZIO patch monitor will be mailed 3 day USPS to your address on file. It may take 3-5 days  to receive your monitor after you have been enrolled.  Once you have received your monitor, please review the enclosed instructions. Your monitor  has already been registered assigning a specific monitor serial # to you.  Billing and Patient Assistance Program Information  We have supplied [employer] with any of your insurance information on file for billing  purposes. [employer] offers a sliding scale Patient Assistance Program for patients that do not have  insurance, or whose insurance does not completely cover the cost of the ZIO monitor.  You must apply for the Patient Assistance Program to qualify for this discounted rate.  To apply, please call [employer] at [phone], select option 4, select option 2, ask to apply for  Patient Assistance Program. Meredeth Ide will ask your household income, and how many people  are in your household. They will quote your out-of-pocket cost based on that information.  [employer] will also be able to set up a 64-month, interest-free payment plan if needed.  Applying the monitor   Shave hair from upper left chest.  Hold abrader disc by orange tab. Rub abrader in 40 strokes over the upper left chest as  indicated in your monitor instructions.  Clean area with 4 enclosed alcohol pads. Let dry.  Apply patch as indicated in monitor instructions. Patch will be placed under collarbone on left  side of chest with arrow pointing upward.  Rub patch adhesive wings for 2 minutes. Remove white label marked "1". Remove the white  label marked "2". Rub patch adhesive wings for 2 additional minutes.  While looking in a mirror, press and release button in center of patch. A small green light will  flash 3-4 times. This will be your only indicator that the monitor has been turned on.  Do not shower for the first 24 hours. You may shower after the first 24 hours.  Press the button if you feel  a symptom. You will hear a small click. Record Date, Time and  Symptom in the Patient Logbook.  When you are ready to remove the patch, follow instructions on the last 2 pages of Patient  Logbook. Stick patch monitor onto the last page of Patient Logbook.  Place Patient Logbook in the blue and white box. Use locking tab on box and tape box closed  securely. The blue and white box has prepaid postage on it. Please place it in the mailbox as  soon  as possible. Your physician should have your test results approximately 7 days after the  monitor has been mailed back to [facility].  Call [facility] Customer Care at [phone] if you have questions regarding  your ZIO XT patch monitor. Call them immediately if you see an orange light blinking on your  monitor.  If your monitor falls off in less than 4 days, contact our Monitor department at [phone].  If your monitor becomes loose or falls off after 4 days call [employer] at [phone] for  suggestions on securing your monitor    Follow-Up: At [facility], you and your health needs are our priority.  As part of our continuing mission to provide you with exceptional heart care, we have created designated Provider Care Teams.  These Care Teams include your primary Cardiologist (physician) and Advanced Practice Providers (APPs -  Physician Assistants and Nurse Practitioners) who all work together to provide you with the care you need, when you need it.  We recommend signing up for the patient portal called "MyChart".  Sign up information is provided on this After Visit Summary.  MyChart is used to connect with patients for Virtual Visits (Telemedicine).  Patients are able to view lab/test results, encounter notes, upcoming appointments, etc.  Non-urgent messages can be sent to your provider as well.   To learn more about what you can do with MyChart, go to [email or web address].    Your next appointment:   3 month(s)  Provider:   Riley Lam, MD   Other Instructions

## 2023-04-02 NOTE — Progress Notes (Unsigned)
Applied a 14 day Zio XT monitor to patient in the office  Dr Izora Ribas to read

## 2023-04-05 NOTE — Therapy (Unsigned)
OUTPATIENT PHYSICAL THERAPY LOWER EXTREMITY EVALUATION   Patient Name: [name] MRN: [id number] DOB:[birth year], 71 y.o., female Today's Date: 04/06/2023  END OF SESSION:  PT End of Session - 04/06/23 1557     Visit Number 1    Number of Visits 12    Date for PT Re-Evaluation 06/01/23    Authorization Type U[employer] MCR    PT Start Time 1600    PT Stop Time 1650    PT Time Calculation (min) 50 min    Activity Tolerance Patient tolerated treatment well;Patient limited by fatigue    Behavior During Therapy [facility] for tasks assessed/performed             Past Medical History:  Diagnosis Date   Acute urinary retention 05/04/2022   Allergy 2006 ?   Contrast dye   Arthritis 2016 ??   Knees and thumb   Cancer (HCC)    cecum   Cataract 2021   Surgery scheduled July 2023   Colon cancer [facility]) 2003   Elevated liver function tests    Esophageal varices (HCC)    Heart murmur On file   Hemorrhage of gastrointestinal tract 05/04/2011   Hypertension 2021   Hypothyroidism    Iron deficiency anemia    Liver disease    chemotherapy complication, per pt, shunts placed to bypass liver   Malignant neoplasm of cecum (HCC)    Portal hypertension (HCC)    Skin cancer 2019   Splenomegaly    Past Surgical History:  Procedure Laterality Date   COLON SURGERY  2004   Cancer   COSMETIC SURGERY  2021   Skin cancer   ESOPHAGEAL VARICE LIGATION     EYE SURGERY     HEMICOLECTOMY  01/08/2003   IR RADIOLOGIST EVAL & MGMT  12/20/2020   IR RADIOLOGIST EVAL & MGMT  05/29/2021   LIVER SURGERY     shunts placed after chemo complication   SKIN FULL THICKNESS GRAFT N/A 09/12/2019   Procedure: debridement and FTSG to the nose from left upper arm;  Surgeon: Allena Napoleon, MD;  Location: [facility];  Service: Plastics;  Laterality: N/A;  2 hours, please   TIPS PROCEDURE     Patient Active Problem List   Diagnosis Date Noted   Adrenal insufficiency (HCC) 03/19/2023    Protein-calorie malnutrition, severe (HCC) 02/26/2023   Acute hepatic encephalopathy (HCC) 02/25/2023   Weight loss 02/24/2023   Bowel habit changes 02/24/2023   Generalized abdominal pain 02/24/2023   Hospital discharge follow-up 02/24/2023   Weakness 01/04/2023   Confusion 08/26/2022   Acute cystitis with hematuria 08/26/2022   Frequent UTI 05/18/2022   Anemia 05/18/2022   Bacteremia 05/04/2022   Hypernatremia 05/04/2022   AKI (acute kidney injury) (HCC) 04/30/2022   Bradycardia 02/05/2022   Hepatic encephalopathy (HCC) 02/03/2022   Basal cell carcinoma (BCC) 05/06/2021   Cecal cancer (HCC) 05/06/2021   Hypertension 05/06/2021   COVID-19 vaccination declined 01/30/2021   Decompensated liver cirrhosis with portal HTN and gastric varices 12/04/2020   Essential hypertension 08/12/2020   Murmur, cardiac 03/19/2020   Chronic pain of both knees 01/29/2020   Hypothyroidism 06/30/2019   History of basal cell cancer 06/30/2019   Hx of colon cancer, stage III 11/30/2011   Esophageal varices (HCC) 06/12/2010   Portal hypertension (HCC) 01/23/2008    PCP: Eden Emms, NP   REFERRING PROVIDER: Eden Emms, NP  REFERRING DIAG: R53.1 (ICD-10-CM) - Weakness  THERAPY DIAG:  Physical deconditioning  Unsteadiness on feet  Muscle weakness (generalized)  Rationale for Evaluation and Treatment: Rehabilitation  ONSET DATE: 02/25/23  SUBJECTIVE:   SUBJECTIVE STATEMENT: Reports a history of imbalance and weakness following a hospital stay.  Uses cane for mobility as she feels unsteady on her feet, denies light headedness or vertigo symptoms.  PERTINENT HISTORY: Patient is accompanied by her spouse today states they are doing better since discharge. They were given Xifaxan antibiotic in the hospital seem to work well. They got prescription from liver clinic but it was really expensive approximately $1200 with insurance. They have applied for financial assistance through the drug  company. Patient is scheduled to have follow-up with cardiology given her bradycardia tachycardia syndrome. Patient is bradycardic in office today but asymptomatic. Patient was also found to have possible adrenal insufficiency she is taking Cortef 5 mg 3 times daily and needs a referral to endocrinology. Patient also needs a new referral to outpatient PT as well in the hospital placed was not working per patient's family PAIN:  Are you having pain? No  PRECAUTIONS: Fall  WEIGHT BEARING RESTRICTIONS: No  FALLS:  Has patient fallen in last 6 months? Yes. Number of falls 1  LIVING ENVIRONMENT: Lives with: lives with their family and lives with their spouse Lives in: House/apartment Stairs:  yes Has following equipment at home: Single point cane  OCCUPATION: retired  PLOF: Independent  PATIENT GOALS: To regain my strength and balance  NEXT MD VISIT: 06/21/23  OBJECTIVE:   DIAGNOSTIC FINDINGS: none  PATIENT SURVEYS:  FOTO 61(68 predicted)  MUSCLE LENGTH: Not tested  POSTURE: rounded shoulders, forward head, and increased thoracic kyphosis  PALPATION: Deferred based on Dx  LOWER EXTREMITY ROM: WFL for transfers and gait  Active ROM Right eval Left eval  Hip flexion    Hip extension    Hip abduction    Hip adduction    Hip internal rotation    Hip external rotation    Knee flexion    Knee extension    Ankle dorsiflexion    Ankle plantarflexion    Ankle inversion    Ankle eversion     (Blank rows = not tested)  LOWER EXTREMITY MMT:  MMT Right eval Left eval  Hip flexion 4- 4-  Hip extension 4- 4-  Hip abduction 4- 4-  Hip adduction    Hip internal rotation    Hip external rotation    Knee flexion 4- 4-  Knee extension 4- 4-  Ankle dorsiflexion    Ankle plantarflexion 4- 4-  Ankle inversion    Ankle eversion     (Blank rows = not tested)  LOWER EXTREMITY SPECIAL TESTS:  Deferred   FUNCTIONAL TESTS:  30 seconds chair stand test 7 stands with UE  assist  GAIT: Distance walked: 3ftx2 Assistive device utilized: Single point cane Level of assistance: SBA Comments: slow cadence,    TODAY'S TREATMENT:  DATE: 04/06/23    PATIENT EDUCATION:  Education details: Discussed eval findings, rehab rationale and POC and patient is in agreement  Person educated: Patient Education method: Explanation Education comprehension: verbalized understanding and needs further education  HOME EXERCISE PROGRAM: Access Code: [email or web address] URL: [email or web address] Date: 04/06/2023 Prepared by: Gustavus Bryant  Exercises - Sit to Stand with Armchair  - 2 x daily - 5 x weekly - 1 sets - 5 reps - Standing Heel Raise with Support  - 2 x daily - 5 x weekly - 1-2 sets - 10 reps  ASSESSMENT:  CLINICAL IMPRESSION: Patient is a 72 y.o. female who was seen today for physical therapy evaluation and treatment for general deconditioning, elevated fall risk and LE weakness following hospital stay.  She denies pain at this time.  BERG score shows mild static balance deficits, 30s chair stand test reveals LE weakness B.  Ambulation is limited to 25ft with SPC and SBA due to LE weakness and endurance deficits.  FOTO score shows 61% functional level.  OBJECTIVE IMPAIRMENTS: Abnormal gait, decreased activity tolerance, decreased balance, decreased endurance, decreased mobility, difficulty walking, decreased strength, and postural dysfunction.   ACTIVITY LIMITATIONS: carrying, lifting, squatting, stairs, and locomotion level  PARTICIPATION LIMITATIONS: meal prep, cleaning, laundry, shopping, community activity, and yard work  PERSONAL FACTORS: Fitness, Past/current experiences, Time since onset of injury/illness/exacerbation, and 1 comorbidity: hepatic cirrhosis   are also affecting patient's functional outcome.   REHAB POTENTIAL:  Good  CLINICAL DECISION MAKING: Stable/uncomplicated  EVALUATION COMPLEXITY: Low   GOALS: Goals reviewed with patient? No  SHORT TERM GOALS: Target date: 04/27/2023   Patient to demonstrate independence in HEP  Baseline: [id number] Goal status: INITIAL  2.  235ft ambulation with SPC and S Baseline: 31ft with SPC and SBA Goal status: INITIAL    LONG TERM GOALS: Target date: 05/18/2023    Increase BLE strength to 4/5 Baseline:  MMT Right eval Left eval  Hip flexion 4- 4-  Hip extension 4- 4-  Hip abduction 4- 4-  Hip adduction    Hip internal rotation    Hip external rotation    Knee flexion 4- 4-  Knee extension 4- 4-  Ankle dorsiflexion    Ankle plantarflexion 4- 4-   Goal status: INITIAL  2.  Increase BERG score to 50 Baseline: 44 Goal status: INITIAL  3.  Increase 30s chair stand test to 10 reps w/UE support Baseline: 7 sytands with UE support Goal status: INITIAL  4.  Patient to negotiate 16 steps with single rail, cane and appropriate step pattern Baseline: TBD Goal status: INITIAL     PLAN:  PT FREQUENCY: 1-2x/week  PT DURATION: 6 weeks  PLANNED INTERVENTIONS: Therapeutic exercises, Therapeutic activity, Neuromuscular re-education, Balance training, Gait training, Patient/Family education, Self Care, Joint mobilization, Stair training, DME instructions, Manual therapy, and Re-evaluation  PLAN FOR NEXT SESSION: HEP review and update, manual techniques as appropriate, aerobic tasks, ROM and flexibility activities, strengthening and PREs, TPDN, gait and balance training as needed     Hildred Laser, PT 04/06/2023, 4:09 PM

## 2023-04-06 ENCOUNTER — Other Ambulatory Visit (HOSPITAL_COMMUNITY): Payer: Self-pay

## 2023-04-06 ENCOUNTER — Ambulatory Visit: Payer: Medicare Other | Attending: Nurse Practitioner

## 2023-04-06 ENCOUNTER — Telehealth: Payer: Self-pay | Admitting: Pharmacy Technician

## 2023-04-06 DIAGNOSIS — M6281 Muscle weakness (generalized): Secondary | ICD-10-CM | POA: Insufficient documentation

## 2023-04-06 DIAGNOSIS — R2681 Unsteadiness on feet: Secondary | ICD-10-CM | POA: Diagnosis not present

## 2023-04-06 DIAGNOSIS — R5381 Other malaise: Secondary | ICD-10-CM | POA: Diagnosis not present

## 2023-04-06 DIAGNOSIS — R531 Weakness: Secondary | ICD-10-CM | POA: Insufficient documentation

## 2023-04-06 NOTE — Telephone Encounter (Signed)
Received request for [facility] for [facility] patient assistance. Office portion faxed to [phone]. Please review provider section, complete, sign and return to [phone].

## 2023-04-06 NOTE — Telephone Encounter (Signed)
Will complete and fax as soon as received.

## 2023-04-08 ENCOUNTER — Ambulatory Visit: Payer: Medicare Other

## 2023-04-08 ENCOUNTER — Ambulatory Visit
Admission: RE | Admit: 2023-04-08 | Discharge: 2023-04-08 | Disposition: A | Discharge status: Home / Self Care | Payer: Medicare Other | Source: Ambulatory Visit | Attending: Nurse Practitioner | Admitting: Nurse Practitioner

## 2023-04-08 DIAGNOSIS — R5381 Other malaise: Secondary | ICD-10-CM | POA: Diagnosis not present

## 2023-04-08 DIAGNOSIS — R531 Weakness: Secondary | ICD-10-CM | POA: Diagnosis not present

## 2023-04-08 DIAGNOSIS — M6281 Muscle weakness (generalized): Secondary | ICD-10-CM | POA: Diagnosis not present

## 2023-04-08 DIAGNOSIS — K8020 Calculus of gallbladder without cholecystitis without obstruction: Secondary | ICD-10-CM | POA: Diagnosis not present

## 2023-04-08 DIAGNOSIS — R2681 Unsteadiness on feet: Secondary | ICD-10-CM | POA: Diagnosis not present

## 2023-04-08 DIAGNOSIS — K7460 Unspecified cirrhosis of liver: Secondary | ICD-10-CM | POA: Diagnosis not present

## 2023-04-08 DIAGNOSIS — K7469 Other cirrhosis of liver: Secondary | ICD-10-CM

## 2023-04-08 NOTE — Telephone Encounter (Signed)
Monchell I have completed the form and faxed back to you this morning. Let me know if you need me to do anything else.

## 2023-04-08 NOTE — Therapy (Signed)
OUTPATIENT PHYSICAL THERAPY TREATMENT NOTE   Patient Name: [name] MRN: [id number] DOB:[birth year], 72 y.o., female Today's Date: 04/08/2023  PCP: Eden Emms, NP  REFERRING PROVIDER: Eden Emms, NP   END OF SESSION:   PT End of Session - 04/08/23 1440     Visit Number 2    Number of Visits 12    Date for PT Re-Evaluation 06/01/23    Authorization Type UHC MCR    PT Start Time 1445    PT Stop Time 1525    PT Time Calculation (min) 40 min    Activity Tolerance Patient tolerated treatment well;Patient limited by fatigue    Behavior During Therapy [facility] for tasks assessed/performed             Past Medical History:  Diagnosis Date   Acute urinary retention 05/04/2022   Allergy 2006 ?   Contrast dye   Arthritis 2016 ??   Knees and thumb   Cancer (HCC)    cecum   Cataract 2021   Surgery scheduled July 2023   Colon cancer [facility]) 2003   Elevated liver function tests    Esophageal varices (HCC)    Heart murmur On file   Hemorrhage of gastrointestinal tract 05/04/2011   Hypertension 2021   Hypothyroidism    Iron deficiency anemia    Liver disease    chemotherapy complication, per pt, shunts placed to bypass liver   Malignant neoplasm of cecum (HCC)    Portal hypertension (HCC)    Skin cancer 2019   Splenomegaly    Past Surgical History:  Procedure Laterality Date   COLON SURGERY  2004   Cancer   COSMETIC SURGERY  2021   Skin cancer   ESOPHAGEAL VARICE LIGATION     EYE SURGERY     HEMICOLECTOMY  01/08/2003   IR RADIOLOGIST EVAL & MGMT  12/20/2020   IR RADIOLOGIST EVAL & MGMT  05/29/2021   LIVER SURGERY     shunts placed after chemo complication   SKIN FULL THICKNESS GRAFT N/A 09/12/2019   Procedure: debridement and FTSG to the nose from left upper arm;  Surgeon: Allena Napoleon, MD;  Location: [facility];  Service: Plastics;  Laterality: N/A;  2 hours, please   TIPS PROCEDURE     Patient Active Problem List   Diagnosis Date Noted    Adrenal insufficiency (HCC) 03/19/2023   Protein-calorie malnutrition, severe (HCC) 02/26/2023   Acute hepatic encephalopathy (HCC) 02/25/2023   Weight loss 02/24/2023   Bowel habit changes 02/24/2023   Generalized abdominal pain 02/24/2023   Hospital discharge follow-up 02/24/2023   Weakness 01/04/2023   Confusion 08/26/2022   Acute cystitis with hematuria 08/26/2022   Frequent UTI 05/18/2022   Anemia 05/18/2022   Bacteremia 05/04/2022   Hypernatremia 05/04/2022   AKI (acute kidney injury) (HCC) 04/30/2022   Bradycardia 02/05/2022   Hepatic encephalopathy (HCC) 02/03/2022   Basal cell carcinoma (BCC) 05/06/2021   Cecal cancer (HCC) 05/06/2021   Hypertension 05/06/2021   COVID-19 vaccination declined 01/30/2021   Decompensated liver cirrhosis with portal HTN and gastric varices 12/04/2020   Essential hypertension 08/12/2020   Murmur, cardiac 03/19/2020   Chronic pain of both knees 01/29/2020   Hypothyroidism 06/30/2019   History of basal cell cancer 06/30/2019   Hx of colon cancer, stage III 11/30/2011   Esophageal varices (HCC) 06/12/2010   Portal hypertension (HCC) 01/23/2008    REFERRING DIAG: R53.1 (ICD-10-CM) - Weakness   THERAPY DIAG:  Physical  deconditioning  Unsteadiness on feet  Muscle weakness (generalized)  Rationale for Evaluation and Treatment Rehabilitation  PERTINENT HISTORY: "Patient is accompanied by her spouse today states they are doing better since discharge. They were given Xifaxan antibiotic in the hospital seem to work well. They got prescription from liver clinic but it was really expensive approximately $1200 with insurance. They have applied for financial assistance through the drug company. Patient is scheduled to have follow-up with cardiology given her bradycardia tachycardia syndrome. Patient is bradycardic in office today but asymptomatic. Patient was also found to have possible adrenal insufficiency she is taking Cortef 5 mg 3 times daily  and needs a referral to endocrinology. Patient also needs a new referral to outpatient PT as well in the hospital placed was not working per patient's family "  PRECAUTIONS: Fall  SUBJECTIVE:                                                                                                                                                                                      SUBJECTIVE STATEMENT:  Patient reports pain from muscle soreness from session earlier this week.    PAIN:  Are you having pain? No   OBJECTIVE: (objective measures completed at initial evaluation unless otherwise dated)   DIAGNOSTIC FINDINGS: none   PATIENT SURVEYS:  FOTO 61(68 predicted)   MUSCLE LENGTH: Not tested   POSTURE: rounded shoulders, forward head, and increased thoracic kyphosis   PALPATION: Deferred based on Dx   LOWER EXTREMITY ROM: WFL for transfers and gait   Active ROM Right eval Left eval  Hip flexion      Hip extension      Hip abduction      Hip adduction      Hip internal rotation      Hip external rotation      Knee flexion      Knee extension      Ankle dorsiflexion      Ankle plantarflexion      Ankle inversion      Ankle eversion       (Blank rows = not tested)   LOWER EXTREMITY MMT:   MMT Right eval Left eval  Hip flexion 4- 4-  Hip extension 4- 4-  Hip abduction 4- 4-  Hip adduction      Hip internal rotation      Hip external rotation      Knee flexion 4- 4-  Knee extension 4- 4-  Ankle dorsiflexion      Ankle plantarflexion 4- 4-  Ankle inversion      Ankle eversion       (Blank rows = not  tested)   LOWER EXTREMITY SPECIAL TESTS:  Deferred    FUNCTIONAL TESTS:  30 seconds chair stand test 7 stands with UE assist   GAIT: Distance walked: 64ftx2 Assistive device utilized: Single point cane Level of assistance: SBA Comments: slow cadence,      TODAY'S TREATMENT:                     [facility] Adult PT Treatment:                                                 DATE: 04/08/23 Therapeutic Exercise: Nustep level 4 x 5 mins LAQ x10, x5 BIL Seated knee flexion RTB x10 BIL STS x10 STS with OH reach x5 Forward stepping with contralateral UE reach only with RUE bec of wrist brace on LUE Standing heel raise with UE support x10 Neuromuscular re-ed: Semi tandem stance x30" BIL FT stance x30" w/look up/down                                                                                                            DATE: 04/06/23      PATIENT EDUCATION:  Education details: Discussed eval findings, rehab rationale and POC and patient is in agreement  Person educated: Patient Education method: Explanation Education comprehension: verbalized understanding and needs further education   HOME EXERCISE PROGRAM: Access Code: [email or web address] URL: [email or web address] Date: 04/06/2023 Prepared by: Gustavus Bryant   Exercises - Sit to Stand with Armchair  - 2 x daily - 5 x weekly - 1 sets - 5 reps - Standing Heel Raise with Support  - 2 x daily - 5 x weekly - 1-2 sets - 10 reps   ASSESSMENT:   CLINICAL IMPRESSION: Patient presents to first follow up PT session reporting some muscle soreness from session earlier this week and HEP compliance. Session today focused on LE strengthening and balance tasks to decrease fall risk. She requires frequent rest breaks throughout session, but is eager to complete exercises and has a very positive attitude during session. She also requires close supervision with balance tasks. Patient continues to benefit from skilled PT services and should be progressed as able to improve functional independence.     OBJECTIVE IMPAIRMENTS: Abnormal gait, decreased activity tolerance, decreased balance, decreased endurance, decreased mobility, difficulty walking, decreased strength, and postural dysfunction.    ACTIVITY LIMITATIONS: carrying, lifting, squatting, stairs, and locomotion level   PARTICIPATION LIMITATIONS: meal  prep, cleaning, laundry, shopping, community activity, and yard work   PERSONAL FACTORS: Fitness, Past/current experiences, Time since onset of injury/illness/exacerbation, and 1 comorbidity: hepatic cirrhosis   are also affecting patient's functional outcome.    REHAB POTENTIAL: Good   CLINICAL DECISION MAKING: Stable/uncomplicated   EVALUATION COMPLEXITY: Low     GOALS: Goals reviewed with patient? No   SHORT TERM GOALS: Target date: 04/27/2023   Patient to demonstrate independence in HEP  Baseline: [id number]  Goal status: INITIAL   2.  226ft ambulation with SPC and S Baseline: 50ft with SPC and SBA Goal status: INITIAL       LONG TERM GOALS: Target date: 05/18/2023     Increase BLE strength to 4/5 Baseline:  MMT Right eval Left eval  Hip flexion 4- 4-  Hip extension 4- 4-  Hip abduction 4- 4-  Hip adduction      Hip internal rotation      Hip external rotation      Knee flexion 4- 4-  Knee extension 4- 4-  Ankle dorsiflexion      Ankle plantarflexion 4- 4-    Goal status: INITIAL   2.  Increase BERG score to 50 Baseline: 44 Goal status: INITIAL   3.  Increase 30s chair stand test to 10 reps w/UE support Baseline: 7 sytands with UE support Goal status: INITIAL   4.  Patient to negotiate 16 steps with single rail, cane and appropriate step pattern Baseline: TBD Goal status: INITIAL         PLAN:   PT FREQUENCY: 1-2x/week   PT DURATION: 6 weeks   PLANNED INTERVENTIONS: Therapeutic exercises, Therapeutic activity, Neuromuscular re-education, Balance training, Gait training, Patient/Family education, Self Care, Joint mobilization, Stair training, DME instructions, Manual therapy, and Re-evaluation   PLAN FOR NEXT SESSION: HEP review and update, manual techniques as appropriate, aerobic tasks, ROM and flexibility activities, strengthening and PREs, TPDN, gait and balance training as needed       Berta Minor, PTA 04/08/2023, 3:26 PM

## 2023-04-10 ENCOUNTER — Other Ambulatory Visit (HOSPITAL_COMMUNITY): Payer: Self-pay

## 2023-04-10 NOTE — Telephone Encounter (Signed)
Submitted Patient Assistance Application to [facility] for [facility] [address]  along with provider portion, and all documents. Will update patient when we receive a response.

## 2023-04-13 ENCOUNTER — Ambulatory Visit: Payer: Medicare Other

## 2023-04-13 DIAGNOSIS — M6281 Muscle weakness (generalized): Secondary | ICD-10-CM

## 2023-04-13 DIAGNOSIS — R531 Weakness: Secondary | ICD-10-CM | POA: Diagnosis not present

## 2023-04-13 DIAGNOSIS — R2681 Unsteadiness on feet: Secondary | ICD-10-CM | POA: Diagnosis not present

## 2023-04-13 DIAGNOSIS — R5381 Other malaise: Secondary | ICD-10-CM

## 2023-04-13 NOTE — Therapy (Signed)
OUTPATIENT PHYSICAL THERAPY TREATMENT NOTE   Patient Name: [name] MRN: [id number] DOB:1951/08/12, 72 y.o., female Today's Date: 04/13/2023  PCP: Eden Emms, NP  REFERRING PROVIDER: Eden Emms, NP   END OF SESSION:   PT End of Session - 04/13/23 1620     Visit Number 3    Number of Visits 12    Date for PT Re-Evaluation 06/01/23    Authorization Type U[employer] MCR    PT Start Time 1620    PT Stop Time 1658    PT Time Calculation (min) 38 min    Activity Tolerance Patient tolerated treatment well;Patient limited by fatigue    Behavior During Therapy [facility] for tasks assessed/performed              Past Medical History:  Diagnosis Date   Acute urinary retention 05/04/2022   Allergy 2006 ?   Contrast dye   Arthritis 2016 ??   Knees and thumb   Cancer (HCC)    cecum   Cataract 2021   Surgery scheduled July 2023   Colon cancer [facility]) 2003   Elevated liver function tests    Esophageal varices (HCC)    Heart murmur On file   Hemorrhage of gastrointestinal tract 05/04/2011   Hypertension 2021   Hypothyroidism    Iron deficiency anemia    Liver disease    chemotherapy complication, per pt, shunts placed to bypass liver   Malignant neoplasm of cecum (HCC)    Portal hypertension (HCC)    Skin cancer 2019   Splenomegaly    Past Surgical History:  Procedure Laterality Date   COLON SURGERY  2004   Cancer   COSMETIC SURGERY  2021   Skin cancer   ESOPHAGEAL VARICE LIGATION     EYE SURGERY     HEMICOLECTOMY  01/08/2003   IR RADIOLOGIST EVAL & MGMT  12/20/2020   IR RADIOLOGIST EVAL & MGMT  05/29/2021   LIVER SURGERY     shunts placed after chemo complication   SKIN FULL THICKNESS GRAFT N/A 09/12/2019   Procedure: debridement and FTSG to the nose from left upper arm;  Surgeon: Allena Napoleon, MD;  Location: [facility];  Service: Plastics;  Laterality: N/A;  2 hours, please   TIPS PROCEDURE     Patient Active Problem List   Diagnosis Date  Noted   Adrenal insufficiency (HCC) 03/19/2023   Protein-calorie malnutrition, severe (HCC) 02/26/2023   Acute hepatic encephalopathy (HCC) 02/25/2023   Weight loss 02/24/2023   Bowel habit changes 02/24/2023   Generalized abdominal pain 02/24/2023   Hospital discharge follow-up 02/24/2023   Weakness 01/04/2023   Confusion 08/26/2022   Acute cystitis with hematuria 08/26/2022   Frequent UTI 05/18/2022   Anemia 05/18/2022   Bacteremia 05/04/2022   Hypernatremia 05/04/2022   AKI (acute kidney injury) (HCC) 04/30/2022   Bradycardia 02/05/2022   Hepatic encephalopathy (HCC) 02/03/2022   Basal cell carcinoma (BCC) 05/06/2021   Cecal cancer (HCC) 05/06/2021   Hypertension 05/06/2021   COVID-19 vaccination declined 01/30/2021   Decompensated liver cirrhosis with portal HTN and gastric varices 12/04/2020   Essential hypertension 08/12/2020   Murmur, cardiac 03/19/2020   Chronic pain of both knees 01/29/2020   Hypothyroidism 06/30/2019   History of basal cell cancer 06/30/2019   Hx of colon cancer, stage III 11/30/2011   Esophageal varices (HCC) 06/12/2010   Portal hypertension (HCC) 01/23/2008    REFERRING DIAG: R53.1 (ICD-10-CM) - Weakness   THERAPY DIAG:  Physical deconditioning  Unsteadiness on feet  Muscle weakness (generalized)  Rationale for Evaluation and Treatment Rehabilitation  PERTINENT HISTORY: "Patient is accompanied by her spouse today states they are doing better since discharge. They were given Xifaxan antibiotic in the hospital seem to work well. They got prescription from liver clinic but it was really expensive approximately $1200 with insurance. They have applied for financial assistance through the drug company. Patient is scheduled to have follow-up with cardiology given her bradycardia tachycardia syndrome. Patient is bradycardic in office today but asymptomatic. Patient was also found to have possible adrenal insufficiency she is taking Cortef 5 mg 3 times  daily and needs a referral to endocrinology. Patient also needs a new referral to outpatient PT as well in the hospital placed was not working per patient's family "  PRECAUTIONS: Fall  SUBJECTIVE:                                                                                                                                                                                      SUBJECTIVE STATEMENT:  Patient reports that she dropped a cookie sheet on her Rt foot on Saturday and that it was hurting over the weekend but that it is feeling better today.    PAIN:  Are you having pain? No   OBJECTIVE: (objective measures completed at initial evaluation unless otherwise dated)   DIAGNOSTIC FINDINGS: none   PATIENT SURVEYS:  FOTO 61(68 predicted)   MUSCLE LENGTH: Not tested   POSTURE: rounded shoulders, forward head, and increased thoracic kyphosis   PALPATION: Deferred based on Dx   LOWER EXTREMITY ROM: WFL for transfers and gait   Active ROM Right eval Left eval  Hip flexion      Hip extension      Hip abduction      Hip adduction      Hip internal rotation      Hip external rotation      Knee flexion      Knee extension      Ankle dorsiflexion      Ankle plantarflexion      Ankle inversion      Ankle eversion       (Blank rows = not tested)   LOWER EXTREMITY MMT:   MMT Right eval Left eval  Hip flexion 4- 4-  Hip extension 4- 4-  Hip abduction 4- 4-  Hip adduction      Hip internal rotation      Hip external rotation      Knee flexion 4- 4-  Knee extension 4- 4-  Ankle dorsiflexion      Ankle plantarflexion 4- 4-  Ankle inversion      Ankle eversion       (Blank rows = not tested)   LOWER EXTREMITY SPECIAL TESTS:  Deferred    FUNCTIONAL TESTS:  30 seconds chair stand test 7 stands with UE assist   GAIT: Distance walked: 42ftx2 Assistive device utilized: Single point cane Level of assistance: SBA Comments: slow cadence,      TODAY'S TREATMENT:             [facility] Adult PT Treatment:                                                DATE: 04/13/23 Therapeutic Exercise: Nustep level 4 x 5 mins LAQ 2x10 BIL Seated knee flexion RTB x10 BIL Seated hip adduction ball squeeze 5" hold x10 Standing hip abduction with UE support x10 BIL STS with OH reach x10 Forward stepping with contralateral UE reach only with RUE bec of wrist brace on LUE Standing heel raise with UE support x10 Neuromuscular re-ed: Semi tandem stance x30" BIL FT stance x30" w/ looking up/down/L/R           [facility] Adult PT Treatment:                                                DATE: 04/08/23 Therapeutic Exercise: Nustep level 4 x 5 mins LAQ x10, x5 BIL Seated knee flexion RTB x10 BIL STS x10 STS with OH reach x5 Forward stepping with contralateral UE reach only with RUE bec of wrist brace on LUE Standing heel raise with UE support x10 Neuromuscular re-ed: Semi tandem stance x30" BIL FT stance x30" w/look up/down                                                                                                            DATE: 04/06/23      PATIENT EDUCATION:  Education details: Discussed eval findings, rehab rationale and POC and patient is in agreement  Person educated: Patient Education method: Explanation Education comprehension: verbalized understanding and needs further education   HOME EXERCISE PROGRAM: Access Code: [email or web address] URL: [email or web address] Date: 04/06/2023 Prepared by: Gustavus Bryant   Exercises - Sit to Stand with Armchair  - 2 x daily - 5 x weekly - 1 sets - 5 reps - Standing Heel Raise with Support  - 2 x daily - 5 x weekly - 1-2 sets - 10 reps   ASSESSMENT:   CLINICAL IMPRESSION: Patient presents to PT reporting that she dropped a cookie sheet on her Rt second toe over the weekend, but that the pain has improved today. Session today continued to focus on LE strengthening, balance, and functional tasks to decrease fall risk.  Patient was able to tolerate all prescribed exercises with no adverse effects. Patient  continues to benefit from skilled PT services and should be progressed as able to improve functional independence.     OBJECTIVE IMPAIRMENTS: Abnormal gait, decreased activity tolerance, decreased balance, decreased endurance, decreased mobility, difficulty walking, decreased strength, and postural dysfunction.    ACTIVITY LIMITATIONS: carrying, lifting, squatting, stairs, and locomotion level   PARTICIPATION LIMITATIONS: meal prep, cleaning, laundry, shopping, community activity, and yard work   PERSONAL FACTORS: Fitness, Past/current experiences, Time since onset of injury/illness/exacerbation, and 1 comorbidity: hepatic cirrhosis   are also affecting patient's functional outcome.    REHAB POTENTIAL: Good   CLINICAL DECISION MAKING: Stable/uncomplicated   EVALUATION COMPLEXITY: Low     GOALS: Goals reviewed with patient? No   SHORT TERM GOALS: Target date: 04/27/2023   Patient to demonstrate independence in HEP  Baseline: [id number] Goal status: INITIAL   2.  238ft ambulation with SPC and S Baseline: 64ft with SPC and SBA Goal status: INITIAL       LONG TERM GOALS: Target date: 05/18/2023     Increase BLE strength to 4/5 Baseline:  MMT Right eval Left eval  Hip flexion 4- 4-  Hip extension 4- 4-  Hip abduction 4- 4-  Hip adduction      Hip internal rotation      Hip external rotation      Knee flexion 4- 4-  Knee extension 4- 4-  Ankle dorsiflexion      Ankle plantarflexion 4- 4-    Goal status: INITIAL   2.  Increase BERG score to 50 Baseline: 44 Goal status: INITIAL   3.  Increase 30s chair stand test to 10 reps w/UE support Baseline: 7 sytands with UE support Goal status: INITIAL   4.  Patient to negotiate 16 steps with single rail, cane and appropriate step pattern Baseline: TBD Goal status: INITIAL         PLAN:   PT FREQUENCY: 1-2x/week   PT DURATION: 6  weeks   PLANNED INTERVENTIONS: Therapeutic exercises, Therapeutic activity, Neuromuscular re-education, Balance training, Gait training, Patient/Family education, Self Care, Joint mobilization, Stair training, DME instructions, Manual therapy, and Re-evaluation   PLAN FOR NEXT SESSION: HEP review and update, manual techniques as appropriate, aerobic tasks, ROM and flexibility activities, strengthening and PREs, TPDN, gait and balance training as needed       Berta Minor, PTA 04/13/2023, 4:58 PM

## 2023-04-14 ENCOUNTER — Telehealth: Payer: Self-pay | Admitting: Nurse Practitioner

## 2023-04-14 MED ORDER — HYDROCORTISONE 5 MG PO TABS: 5.0000 mg | ORAL_TABLET | Freq: Three times a day (TID) | ORAL | 1 refills | Status: DC

## 2023-04-14 NOTE — Telephone Encounter (Signed)
We will continue the medication. I have referred to endocrine but do not see any notes can we check on that. I will send in a refill

## 2023-04-14 NOTE — Addendum Note (Signed)
Addended by: Eden Emms on: 04/14/2023 01:13 PM   Modules accepted: Orders

## 2023-04-14 NOTE — Telephone Encounter (Signed)
Received fax from [facility] for [facility]  patient assistance, patient's application has been DENIED due to [employer].    Patient has been notified of decision per [facility].  PHONE: [phone]

## 2023-04-14 NOTE — Telephone Encounter (Signed)
Patient husband called in and stated that today she takes her last hydrocortisone (CORTEF) 5 MG tablet. He was wanting to know if she needs a refill on this medication based off her lab results. Thank you!

## 2023-04-15 ENCOUNTER — Ambulatory Visit: Payer: Medicare Other

## 2023-04-15 DIAGNOSIS — R2681 Unsteadiness on feet: Secondary | ICD-10-CM | POA: Diagnosis not present

## 2023-04-15 DIAGNOSIS — R5381 Other malaise: Secondary | ICD-10-CM

## 2023-04-15 DIAGNOSIS — M6281 Muscle weakness (generalized): Secondary | ICD-10-CM | POA: Diagnosis not present

## 2023-04-15 DIAGNOSIS — R531 Weakness: Secondary | ICD-10-CM | POA: Diagnosis not present

## 2023-04-15 NOTE — Therapy (Signed)
OUTPATIENT PHYSICAL THERAPY TREATMENT NOTE   Patient Name: [name] MRN: [id number] DOB:1950/12/03, 72 y.o., female Today's Date: 04/15/2023  PCP: Eden Emms, NP  REFERRING PROVIDER: Eden Emms, NP   END OF SESSION:   PT End of Session - 04/15/23 1659     Visit Number 4    Number of Visits 12    Date for PT Re-Evaluation 06/01/23    Authorization Type UHC MCR    PT Start Time 1619    PT Stop Time 1657    PT Time Calculation (min) 38 min    Activity Tolerance Patient tolerated treatment well;Patient limited by fatigue    Behavior During Therapy [facility] for tasks assessed/performed               Past Medical History:  Diagnosis Date   Acute urinary retention 05/04/2022   Allergy 2006 ?   Contrast dye   Arthritis 2016 ??   Knees and thumb   Cancer (HCC)    cecum   Cataract 2021   Surgery scheduled July 2023   Colon cancer [facility]) 2003   Elevated liver function tests    Esophageal varices (HCC)    Heart murmur On file   Hemorrhage of gastrointestinal tract 05/04/2011   Hypertension 2021   Hypothyroidism    Iron deficiency anemia    Liver disease    chemotherapy complication, per pt, shunts placed to bypass liver   Malignant neoplasm of cecum (HCC)    Portal hypertension (HCC)    Skin cancer 2019   Splenomegaly    Past Surgical History:  Procedure Laterality Date   COLON SURGERY  2004   Cancer   COSMETIC SURGERY  2021   Skin cancer   ESOPHAGEAL VARICE LIGATION     EYE SURGERY     HEMICOLECTOMY  01/08/2003   IR RADIOLOGIST EVAL & MGMT  12/20/2020   IR RADIOLOGIST EVAL & MGMT  05/29/2021   LIVER SURGERY     shunts placed after chemo complication   SKIN FULL THICKNESS GRAFT N/A 09/12/2019   Procedure: debridement and FTSG to the nose from left upper arm;  Surgeon: Allena Napoleon, MD;  Location: [facility];  Service: Plastics;  Laterality: N/A;  2 hours, please   TIPS PROCEDURE     Patient Active Problem List   Diagnosis Date  Noted   Adrenal insufficiency (HCC) 03/19/2023   Protein-calorie malnutrition, severe (HCC) 02/26/2023   Acute hepatic encephalopathy (HCC) 02/25/2023   Weight loss 02/24/2023   Bowel habit changes 02/24/2023   Generalized abdominal pain 02/24/2023   Hospital discharge follow-up 02/24/2023   Weakness 01/04/2023   Confusion 08/26/2022   Acute cystitis with hematuria 08/26/2022   Frequent UTI 05/18/2022   Anemia 05/18/2022   Bacteremia 05/04/2022   Hypernatremia 05/04/2022   AKI (acute kidney injury) (HCC) 04/30/2022   Bradycardia 02/05/2022   Hepatic encephalopathy (HCC) 02/03/2022   Basal cell carcinoma (BCC) 05/06/2021   Cecal cancer (HCC) 05/06/2021   Hypertension 05/06/2021   COVID-19 vaccination declined 01/30/2021   Decompensated liver cirrhosis with portal HTN and gastric varices 12/04/2020   Essential hypertension 08/12/2020   Murmur, cardiac 03/19/2020   Chronic pain of both knees 01/29/2020   Hypothyroidism 06/30/2019   History of basal cell cancer 06/30/2019   Hx of colon cancer, stage III 11/30/2011   Esophageal varices (HCC) 06/12/2010   Portal hypertension (HCC) 01/23/2008    REFERRING DIAG: R53.1 (ICD-10-CM) - Weakness   THERAPY DIAG:  Physical deconditioning  Unsteadiness on feet  Rationale for Evaluation and Treatment Rehabilitation  PERTINENT HISTORY:  See PMH  PRECAUTIONS: Fall  SUBJECTIVE:                                                                                                                                                                                      SUBJECTIVE STATEMENT:  Pt presents to PT with reports of no current pain or discomfort. Has been compliant with HEP with no adverse effect.   PAIN:  Are you having pain? No   OBJECTIVE: (objective measures completed at initial evaluation unless otherwise dated)   DIAGNOSTIC FINDINGS: none   PATIENT SURVEYS:  FOTO 61(68 predicted)   MUSCLE LENGTH: Not tested   POSTURE:  rounded shoulders, forward head, and increased thoracic kyphosis   PALPATION: Deferred based on Dx   LOWER EXTREMITY ROM: WFL for transfers and gait   Active ROM Right eval Left eval  Hip flexion      Hip extension      Hip abduction      Hip adduction      Hip internal rotation      Hip external rotation      Knee flexion      Knee extension      Ankle dorsiflexion      Ankle plantarflexion      Ankle inversion      Ankle eversion       (Blank rows = not tested)   LOWER EXTREMITY MMT:   MMT Right eval Left eval  Hip flexion 4- 4-  Hip extension 4- 4-  Hip abduction 4- 4-  Hip adduction      Hip internal rotation      Hip external rotation      Knee flexion 4- 4-  Knee extension 4- 4-  Ankle dorsiflexion      Ankle plantarflexion 4- 4-  Ankle inversion      Ankle eversion       (Blank rows = not tested)   LOWER EXTREMITY SPECIAL TESTS:  Deferred    FUNCTIONAL TESTS:  30 seconds chair stand test 7 stands with UE assist   GAIT: Distance walked: 12ftx2 Assistive device utilized: Single point cane Level of assistance: SBA Comments: slow cadence,      TODAY'S TREATMENT:            [facility] Adult PT Treatment:  DATE: 04/15/23 Therapeutic Exercise: Nustep level 3 x 4 mins while taking subjective LAQ 2x10 BIL  Seated knee flexion RTB x10 BIL STS with OH press 3x5 - no UE  Seated hip adduction ball squeeze 5" hold 2x10 Standing hip abd/ext x 10 each - B UE support Seated clamshell 2x15 RTB Seated march RTB x 60"   [facility] Adult PT Treatment:                                                DATE: 04/13/23 Therapeutic Exercise: Nustep level 4 x 5 mins LAQ 2x10 BIL Seated knee flexion RTB x10 BIL Seated hip adduction ball squeeze 5" hold x10 Standing hip abduction with UE support x10 BIL STS with OH reach x10 Forward stepping with contralateral UE reach only with RUE bec of wrist brace on LUE Standing heel raise with  UE support x10 Neuromuscular re-ed: Semi tandem stance x30" BIL FT stance x30" w/ looking up/down/L/R           [facility] Adult PT Treatment:                                                DATE: 04/08/23 Therapeutic Exercise: Nustep level 4 x 5 mins LAQ x10, x5 BIL Seated knee flexion RTB x10 BIL STS x10 STS with OH reach x5 Forward stepping with contralateral UE reach only with RUE bec of wrist brace on LUE Standing heel raise with UE support x10 Neuromuscular re-ed: Semi tandem stance x30" BIL FT stance x30" w/look up/down   PATIENT EDUCATION:  Education details: Discussed eval findings, rehab rationale and POC and patient is in agreement  Person educated: Patient Education method: Explanation Education comprehension: verbalized understanding and needs further education   HOME EXERCISE PROGRAM: Access Code: [email or web address] URL: [email or web address] Date: 04/06/2023 Prepared by: Gustavus Bryant   Exercises - Sit to Stand with Armchair  - 2 x daily - 5 x weekly - 1 sets - 5 reps - Standing Heel Raise with Support  - 2 x daily - 5 x weekly - 1-2 sets - 10 reps   ASSESSMENT:   CLINICAL IMPRESSION: Pt was able to complete all prescribed exercises with no adverse effect. Therapy today focused on improving proximal hip strength and functional activity tolerance. Pt continues to benefit from skilled PT services as she is operating well below PLOF. Will continue to progress as able per POC.    OBJECTIVE IMPAIRMENTS: Abnormal gait, decreased activity tolerance, decreased balance, decreased endurance, decreased mobility, difficulty walking, decreased strength, and postural dysfunction.    ACTIVITY LIMITATIONS: carrying, lifting, squatting, stairs, and locomotion level   PARTICIPATION LIMITATIONS: meal prep, cleaning, laundry, shopping, community activity, and yard work   PERSONAL FACTORS: Fitness, Past/current experiences, Time since onset of injury/illness/exacerbation, and 1  comorbidity: hepatic cirrhosis   are also affecting patient's functional outcome.    REHAB POTENTIAL: Good   CLINICAL DECISION MAKING: Stable/uncomplicated   EVALUATION COMPLEXITY: Low     GOALS: Goals reviewed with patient? No   SHORT TERM GOALS: Target date: 04/27/2023   Patient to demonstrate independence in HEP  Baseline: [id number] Goal status: INITIAL   2.  285ft ambulation with SPC and S Baseline: 11ft with SPC and SBA Goal  status: INITIAL       LONG TERM GOALS: Target date: 05/18/2023     Increase BLE strength to 4/5 Baseline:  MMT Right eval Left eval  Hip flexion 4- 4-  Hip extension 4- 4-  Hip abduction 4- 4-  Hip adduction      Hip internal rotation      Hip external rotation      Knee flexion 4- 4-  Knee extension 4- 4-  Ankle dorsiflexion      Ankle plantarflexion 4- 4-    Goal status: INITIAL   2.  Increase BERG score to 50 Baseline: 44 Goal status: INITIAL   3.  Increase 30s chair stand test to 10 reps w/UE support Baseline: 7 sytands with UE support Goal status: INITIAL   4.  Patient to negotiate 16 steps with single rail, cane and appropriate step pattern Baseline: TBD Goal status: INITIAL         PLAN:   PT FREQUENCY: 1-2x/week   PT DURATION: 6 weeks   PLANNED INTERVENTIONS: Therapeutic exercises, Therapeutic activity, Neuromuscular re-education, Balance training, Gait training, Patient/Family education, Self Care, Joint mobilization, Stair training, DME instructions, Manual therapy, and Re-evaluation   PLAN FOR NEXT SESSION: HEP review and update, manual techniques as appropriate, aerobic tasks, ROM and flexibility activities, strengthening and PREs, TPDN, gait and balance training as needed       Eloy End, PT 04/15/2023, 5:01 PM

## 2023-04-20 ENCOUNTER — Encounter: Payer: Self-pay | Admitting: Internal Medicine

## 2023-04-20 ENCOUNTER — Ambulatory Visit: Payer: Medicare Other

## 2023-04-20 ENCOUNTER — Ambulatory Visit: Payer: Medicare Other | Admitting: Internal Medicine

## 2023-04-20 VITALS — BP 120/60 | HR 68 | Ht 62.25 in | Wt 161.1 lb

## 2023-04-20 DIAGNOSIS — R2681 Unsteadiness on feet: Secondary | ICD-10-CM

## 2023-04-20 DIAGNOSIS — J90 Pleural effusion, not elsewhere classified: Secondary | ICD-10-CM | POA: Diagnosis not present

## 2023-04-20 DIAGNOSIS — I700 Atherosclerosis of aorta: Secondary | ICD-10-CM | POA: Diagnosis not present

## 2023-04-20 DIAGNOSIS — I10 Essential (primary) hypertension: Secondary | ICD-10-CM | POA: Diagnosis not present

## 2023-04-20 DIAGNOSIS — Z85038 Personal history of other malignant neoplasm of large intestine: Secondary | ICD-10-CM | POA: Diagnosis not present

## 2023-04-20 DIAGNOSIS — R5381 Other malaise: Secondary | ICD-10-CM

## 2023-04-20 DIAGNOSIS — J9811 Atelectasis: Secondary | ICD-10-CM | POA: Diagnosis not present

## 2023-04-20 DIAGNOSIS — N390 Urinary tract infection, site not specified: Secondary | ICD-10-CM | POA: Diagnosis not present

## 2023-04-20 DIAGNOSIS — K7682 Hepatic encephalopathy: Secondary | ICD-10-CM

## 2023-04-20 DIAGNOSIS — Z9049 Acquired absence of other specified parts of digestive tract: Secondary | ICD-10-CM | POA: Diagnosis not present

## 2023-04-20 DIAGNOSIS — R401 Stupor: Secondary | ICD-10-CM | POA: Diagnosis not present

## 2023-04-20 DIAGNOSIS — J189 Pneumonia, unspecified organism: Secondary | ICD-10-CM | POA: Diagnosis present

## 2023-04-20 DIAGNOSIS — Z9221 Personal history of antineoplastic chemotherapy: Secondary | ICD-10-CM | POA: Diagnosis not present

## 2023-04-20 DIAGNOSIS — A401 Sepsis due to streptococcus, group B: Secondary | ICD-10-CM | POA: Diagnosis not present

## 2023-04-20 DIAGNOSIS — K7460 Unspecified cirrhosis of liver: Secondary | ICD-10-CM

## 2023-04-20 DIAGNOSIS — M7989 Other specified soft tissue disorders: Secondary | ICD-10-CM | POA: Diagnosis not present

## 2023-04-20 DIAGNOSIS — R4182 Altered mental status, unspecified: Secondary | ICD-10-CM | POA: Diagnosis present

## 2023-04-20 DIAGNOSIS — L03115 Cellulitis of right lower limb: Secondary | ICD-10-CM | POA: Diagnosis not present

## 2023-04-20 DIAGNOSIS — K766 Portal hypertension: Secondary | ICD-10-CM | POA: Diagnosis not present

## 2023-04-20 DIAGNOSIS — R6521 Severe sepsis with septic shock: Secondary | ICD-10-CM | POA: Diagnosis not present

## 2023-04-20 DIAGNOSIS — E2740 Unspecified adrenocortical insufficiency: Secondary | ICD-10-CM | POA: Diagnosis not present

## 2023-04-20 DIAGNOSIS — I864 Gastric varices: Secondary | ICD-10-CM | POA: Diagnosis not present

## 2023-04-20 DIAGNOSIS — Z8249 Family history of ischemic heart disease and other diseases of the circulatory system: Secondary | ICD-10-CM | POA: Diagnosis not present

## 2023-04-20 DIAGNOSIS — Z95828 Presence of other vascular implants and grafts: Secondary | ICD-10-CM | POA: Diagnosis not present

## 2023-04-20 DIAGNOSIS — R109 Unspecified abdominal pain: Secondary | ICD-10-CM | POA: Diagnosis not present

## 2023-04-20 DIAGNOSIS — Z79890 Hormone replacement therapy (postmenopausal): Secondary | ICD-10-CM | POA: Diagnosis not present

## 2023-04-20 DIAGNOSIS — K7030 Alcoholic cirrhosis of liver without ascites: Secondary | ICD-10-CM | POA: Diagnosis not present

## 2023-04-20 DIAGNOSIS — M6281 Muscle weakness (generalized): Secondary | ICD-10-CM

## 2023-04-20 DIAGNOSIS — N179 Acute kidney failure, unspecified: Secondary | ICD-10-CM | POA: Diagnosis not present

## 2023-04-20 DIAGNOSIS — I4719 Other supraventricular tachycardia: Secondary | ICD-10-CM | POA: Diagnosis not present

## 2023-04-20 DIAGNOSIS — R000 Tachycardia, unspecified: Secondary | ICD-10-CM | POA: Diagnosis not present

## 2023-04-20 DIAGNOSIS — Z8261 Family history of arthritis: Secondary | ICD-10-CM | POA: Diagnosis not present

## 2023-04-20 DIAGNOSIS — E039 Hypothyroidism, unspecified: Secondary | ICD-10-CM | POA: Diagnosis not present

## 2023-04-20 DIAGNOSIS — I495 Sick sinus syndrome: Secondary | ICD-10-CM | POA: Diagnosis not present

## 2023-04-20 DIAGNOSIS — Z85828 Personal history of other malignant neoplasm of skin: Secondary | ICD-10-CM | POA: Diagnosis not present

## 2023-04-20 DIAGNOSIS — L03119 Cellulitis of unspecified part of limb: Secondary | ICD-10-CM | POA: Diagnosis not present

## 2023-04-20 DIAGNOSIS — I471 Supraventricular tachycardia, unspecified: Secondary | ICD-10-CM | POA: Diagnosis not present

## 2023-04-20 DIAGNOSIS — E038 Other specified hypothyroidism: Secondary | ICD-10-CM | POA: Diagnosis not present

## 2023-04-20 DIAGNOSIS — Z23 Encounter for immunization: Secondary | ICD-10-CM | POA: Diagnosis not present

## 2023-04-20 DIAGNOSIS — N178 Other acute kidney failure: Secondary | ICD-10-CM | POA: Diagnosis not present

## 2023-04-20 DIAGNOSIS — A419 Sepsis, unspecified organism: Secondary | ICD-10-CM | POA: Diagnosis not present

## 2023-04-20 NOTE — Progress Notes (Signed)
HISTORY OF PRESENT ILLNESS:  [name] is a 72 y.o. female who arrives today accompanied by her husband and serves as her own historian, though he participates significantly.  She is followed by [facility].  Most recent evaluations on 09/08/2022 and and post hospital follow-up March 16, 2023.  Previous patient of Dr. Tressia Danas.  After recent hospitalization for encephalopathy, is sent to reestablish with new GI physician.  Last office visit with Dr. Orvan Falconer February 21, 2019.  Patient with decompensated cirrhosis likely secondary to oxaliplatin induced portal hypertension and possible component of mASH.  Patient has a history of Adenocarcinoma of the cecum, Stage IIIB, T3N1 with matted lymph node mass measuring 4 cm, S/P hemicolectomy and adjuvant FOLFOX x 12 cycles from 02/05/03-09/24/03. She has not had surveillance colonoscopy in several years. Past medical history is also notable for basal cell carcinoma of the skin, hypertension, hypothyroid, and obesity.  She developed oxaliplatin induced portal hypertension with esophageal varices and had a variceal hemorrhage in 2012 for which she underwent TIPS at [facility]. Her most recent EGD was in 2010. She had Doppler ultrasound of the liver in January 2022 that questioned possible TIPS stenosis. She was seen by interventional radiology and decision was made not to attempt revision given new hepatic encephalopathy. Plan is to possibly perform EGD by Dr. Orvan Falconer in the near future once her encephalopathy is better managed. She denies any melena, hematemesis, or hematochezia.  She likely also has cirrhosis based on cirrhotic morphology of the liver, thrombocytopenia, and hepatic encephalopathy.  Patient has had multiple hospitalizations for encephalopathy and most recently was admitted at [facility] from 02/25/2023 through 03/13/2023 with encephalopathy, AKI, cystitis, and bradycardia with SVT.  This  hospitalization occurred after patient fell and broke her left wrist.  Thereafter was given oxycodone.  Became constipated and encephalopathic.  Was treated with lactulose via NG tube and Xifaxan in the hospital.  UTI treated.  Discharged home after 16 days.  Patient tells me that she is currently taking 45 mL of lactulose 3 times daily.  Generally has 1 or 2 bowel movements.  Has not taken Xifaxan since leaving the hospital due to cost.  Told that she is at her baseline with regards to mentation.  She works as a Comptroller.  Currently other GI medication is low-dose Aldactone at 25 mg daily for edema.  During her recent hospitalization ultrasound showed patent TIPS.  Cirrhotic morphology without liver lesion or mass.  LABORATORIES 2024: Liver function test: 67/49/325/1 0.1 Basic metabolic panel: Normal.  Sodium 143 CBC: White blood cell count 2.5, hemoglobin 9.7, platelets 129 INR: 1.8   REVIEW OF SYSTEMS:  All non-GI ROS negative unless otherwise stated in the HPI except for confusion, ankle swelling  Past Medical History:  Diagnosis Date   Acute urinary retention 05/04/2022   Allergy 2006 ?   Contrast dye   Arthritis 2016 ??   Knees and thumb   Cancer [facility])    cecum   Cataract 2021   Surgery scheduled July 2023   Colon cancer [facility]) 2003   Elevated liver function tests    Esophageal varices (HCC)    Heart murmur On file   Hemorrhage of gastrointestinal tract 05/04/2011   Hypertension 2021   Hypothyroidism    Iron deficiency anemia    Liver disease    chemotherapy complication, per pt, shunts placed to bypass liver   Malignant neoplasm of cecum (HCC)    Portal  hypertension (HCC)    Skin cancer 2019   Splenomegaly     Past Surgical History:  Procedure Laterality Date   COLON SURGERY  2004   Cancer   COSMETIC SURGERY  2021   Skin cancer   ESOPHAGEAL VARICE LIGATION     EYE SURGERY     HEMICOLECTOMY  01/08/2003   IR RADIOLOGIST EVAL & MGMT  12/20/2020   IR RADIOLOGIST  EVAL & MGMT  05/29/2021   LIVER SURGERY     shunts placed after chemo complication   SKIN FULL THICKNESS GRAFT N/A 09/12/2019   Procedure: debridement and FTSG to the nose from left upper arm;  Surgeon: Allena Napoleon, MD;  Location: [facility];  Service: Plastics;  Laterality: N/A;  2 hours, please   TIPS PROCEDURE      Social History [name]  reports that she has never smoked. She has never used smokeless tobacco. She reports that she does not drink alcohol and does not use drugs.  family history includes Arthritis in her father and mother; Cancer in her maternal aunt; Diabetes in her father; Hearing loss in her mother; Heart disease in her father and mother; Hypertension in her mother; Miscarriages / [place] in her mother.  Allergies  Allergen Reactions   Contrast Media [Iodinated Contrast Media] Hives       PHYSICAL EXAMINATION: Vital signs: BP 120/60 (BP Location: Left Arm, Patient Position: Sitting, Cuff Size: Normal)   Pulse 68   Ht 5' 2.25" (1.581 m)   Wt 161 lb 2 oz (73.1 kg)   BMI 29.23 kg/m   Constitutional: Pleasant, chronically ill-appearing, no acute distress Psychiatric: alert and oriented x3, cooperative Eyes: extraocular movements intact, anicteric, conjunctiva pink Mouth: oral pharynx moist, no lesions Neck: supple no lymphadenopathy Cardiovascular: heart regular rate and rhythm, 3/6 systolic murmur Lungs: clear to auscultation bilaterally Abdomen: soft, nontender, nondistended, no obvious ascites, no peritoneal signs, normal bowel sounds, no organomegaly Rectal: Omitted Extremities: no clubbing or cyanosis.  1+ lower extremity edema bilaterally Skin: no lesions on visible extremities.  Scarring on nose from prior skin cancer.  Small lesion on right eyelid.  Ecchymoses upper extremities.  Left wrist in brace. Neuro: No focal deficits. No asterixis (right hand).    ASSESSMENT:  1.  Cirrhosis likely secondary to oxaliplatin  induced portal hypertension and possible component of MASH. 2.  Status post TIPS in 2012 for variceal bleeding 3.  Recent hospitalization with hepatic encephalopathy.  Improved.  Currently on lactulose 45 mL twice daily 4.  History of colon cancer 2004.  Last colonoscopy 2015 5.  Incidental cholelithiasis   PLAN:  1.  Continue lactulose.  We discussed proper way to titrate based on symptoms.  If symptomatic, push lactulose to achieve 3-5 bowel movements per day. 2.  Try to avoid Xifaxan.  Would only be needed if encephalopathic despite maximal lactulose therapy. 3.  No plans for repeat EGD.  Patent TIPS on ultrasound 4.  Ongoing liver specialty care with [facility].  Her next appointment with [facility] is in August 5.  General GI follow-up in this clinic as needed A total time of 45 minutes was spent preparing to see the patient (majority of the time), obtaining comprehensive history, performing medically appropriate physical examination, counseling and educating patient and her husband regarding above listed issues, and documenting clinical information in the health record.

## 2023-04-20 NOTE — Patient Instructions (Signed)
Please follow up as needed.  _______________________________________________________  If your blood pressure at your visit was 140/90 or greater, please contact your primary care physician to follow up on this.  _______________________________________________________  If you are age 72 or older, your body mass index should be between 23-30. Your Body mass index is 29.23 kg/m. If this is out of the aforementioned range listed, please consider follow up with your Primary Care Provider.  If you are age 20 or younger, your body mass index should be between 19-25. Your Body mass index is 29.23 kg/m. If this is out of the aformentioned range listed, please consider follow up with your Primary Care Provider.   ________________________________________________________  The [facility] providers would like to encourage you to use [facility] to communicate with providers for non-urgent requests or questions.  Due to long hold times on the telephone, sending your provider a message by [facility] may be a faster and more efficient way to get a response.  Please allow 48 business hours for a response.  Please remember that this is for non-urgent requests.  _______________________________________________________

## 2023-04-20 NOTE — Therapy (Signed)
OUTPATIENT PHYSICAL THERAPY TREATMENT NOTE   Patient Name: [name] MRN: [id number] DOB:Apr 03, 1951, 72 y.o., female Today's Date: 04/20/2023  PCP: Eden Emms, NP  REFERRING PROVIDER: Eden Emms, NP   END OF SESSION:   PT End of Session - 04/20/23 1619     Visit Number 5    Number of Visits 12    Date for PT Re-Evaluation 06/01/23    Authorization Type U[employer] MCR    PT Start Time 1615    PT Stop Time 1655    PT Time Calculation (min) 40 min    Activity Tolerance Patient tolerated treatment well;Patient limited by fatigue    Behavior During Therapy [facility] for tasks assessed/performed                Past Medical History:  Diagnosis Date   Acute urinary retention 05/04/2022   Allergy 2006 ?   Contrast dye   Arthritis 2016 ??   Knees and thumb   Cancer (HCC)    cecum   Cataract 2021   Surgery scheduled July 2023   Colon cancer [facility]) 2003   Elevated liver function tests    Esophageal varices (HCC)    Heart murmur On file   Hemorrhage of gastrointestinal tract 05/04/2011   Hypertension 2021   Hypothyroidism    Iron deficiency anemia    Liver disease    chemotherapy complication, per pt, shunts placed to bypass liver   Malignant neoplasm of cecum (HCC)    Portal hypertension (HCC)    Skin cancer 2019   Splenomegaly    Past Surgical History:  Procedure Laterality Date   COLON SURGERY  2004   Cancer   COSMETIC SURGERY  2021   Skin cancer   ESOPHAGEAL VARICE LIGATION     EYE SURGERY     HEMICOLECTOMY  01/08/2003   IR RADIOLOGIST EVAL & MGMT  12/20/2020   IR RADIOLOGIST EVAL & MGMT  05/29/2021   LIVER SURGERY     shunts placed after chemo complication   SKIN FULL THICKNESS GRAFT N/A 09/12/2019   Procedure: debridement and FTSG to the nose from left upper arm;  Surgeon: Allena Napoleon, MD;  Location: [facility];  Service: Plastics;  Laterality: N/A;  2 hours, please   TIPS PROCEDURE     Patient Active Problem List   Diagnosis Date  Noted   Adrenal insufficiency (HCC) 03/19/2023   Protein-calorie malnutrition, severe (HCC) 02/26/2023   Acute hepatic encephalopathy (HCC) 02/25/2023   Weight loss 02/24/2023   Bowel habit changes 02/24/2023   Generalized abdominal pain 02/24/2023   Hospital discharge follow-up 02/24/2023   Weakness 01/04/2023   Confusion 08/26/2022   Acute cystitis with hematuria 08/26/2022   Frequent UTI 05/18/2022   Anemia 05/18/2022   Bacteremia 05/04/2022   Hypernatremia 05/04/2022   AKI (acute kidney injury) (HCC) 04/30/2022   Bradycardia 02/05/2022   Hepatic encephalopathy (HCC) 02/03/2022   Basal cell carcinoma (BCC) 05/06/2021   Cecal cancer (HCC) 05/06/2021   Hypertension 05/06/2021   COVID-19 vaccination declined 01/30/2021   Decompensated liver cirrhosis with portal HTN and gastric varices 12/04/2020   Essential hypertension 08/12/2020   Murmur, cardiac 03/19/2020   Chronic pain of both knees 01/29/2020   Hypothyroidism 06/30/2019   History of basal cell cancer 06/30/2019   Hx of colon cancer, stage III 11/30/2011   Esophageal varices (HCC) 06/12/2010   Portal hypertension (HCC) 01/23/2008    REFERRING DIAG: R53.1 (ICD-10-CM) - Weakness   THERAPY  DIAG:  Physical deconditioning  Unsteadiness on feet  Muscle weakness (generalized)  Rationale for Evaluation and Treatment Rehabilitation  PERTINENT HISTORY:  See PMH  PRECAUTIONS: Fall  SUBJECTIVE:                                                                                                                                                                                      SUBJECTIVE STATEMENT:  No changes to report but feels RLE is stiff today, symptoms in foreleg.  Denies pain or excess sodium intake.   PAIN:  Are you having pain? No   OBJECTIVE: (objective measures completed at initial evaluation unless otherwise dated)   DIAGNOSTIC FINDINGS: none   PATIENT SURVEYS:  FOTO 61(68 predicted)   MUSCLE  LENGTH: Not tested   POSTURE: rounded shoulders, forward head, and increased thoracic kyphosis   PALPATION: Deferred based on Dx   LOWER EXTREMITY ROM: WFL for transfers and gait   Active ROM Right eval Left eval  Hip flexion      Hip extension      Hip abduction      Hip adduction      Hip internal rotation      Hip external rotation      Knee flexion      Knee extension      Ankle dorsiflexion      Ankle plantarflexion      Ankle inversion      Ankle eversion       (Blank rows = not tested)   LOWER EXTREMITY MMT:   MMT Right eval Left eval  Hip flexion 4- 4-  Hip extension 4- 4-  Hip abduction 4- 4-  Hip adduction      Hip internal rotation      Hip external rotation      Knee flexion 4- 4-  Knee extension 4- 4-  Ankle dorsiflexion      Ankle plantarflexion 4- 4-  Ankle inversion      Ankle eversion       (Blank rows = not tested)   LOWER EXTREMITY SPECIAL TESTS:  Deferred    FUNCTIONAL TESTS:  30 seconds chair stand test 7 stands with UE assist   GAIT: Distance walked: 57ftx2 Assistive device utilized: Single point cane Level of assistance: SBA Comments: slow cadence,      TODAY'S TREATMENT:      [facility] Adult PT Treatment:  DATE: 04/20/23 Therapeutic Exercise: Nustep level 2 x 5 min Supine hip fallouts GTB 10x B, 10/10 unilaterally SLR 10/10 Supine march 10/10 Supine heel slides 10/10 SAQs 10/10 STS 5x emphasized eccentric component       [facility] Adult PT Treatment:                                                DATE: 04/15/23 Therapeutic Exercise: Nustep level 3 x 4 mins while taking subjective LAQ 2x10 BIL  Seated knee flexion RTB x10 BIL STS with OH press 3x5 - no UE  Seated hip adduction ball squeeze 5" hold 2x10 Standing hip abd/ext x 10 each - B UE support Seated clamshell 2x15 RTB Seated march RTB x 60"   [facility] Adult PT Treatment:                                                DATE:  04/13/23 Therapeutic Exercise: Nustep level 4 x 5 mins LAQ 2x10 BIL Seated knee flexion RTB x10 BIL Seated hip adduction ball squeeze 5" hold x10 Standing hip abduction with UE support x10 BIL STS with OH reach x10 Forward stepping with contralateral UE reach only with RUE bec of wrist brace on LUE Standing heel raise with UE support x10 Neuromuscular re-ed: Semi tandem stance x30" BIL FT stance x30" w/ looking up/down/L/R           [facility] Adult PT Treatment:                                                DATE: 04/08/23 Therapeutic Exercise: Nustep level 4 x 5 mins LAQ x10, x5 BIL Seated knee flexion RTB x10 BIL STS x10 STS with OH reach x5 Forward stepping with contralateral UE reach only with RUE bec of wrist brace on LUE Standing heel raise with UE support x10 Neuromuscular re-ed: Semi tandem stance x30" BIL FT stance x30" w/look up/down   PATIENT EDUCATION:  Education details: Discussed eval findings, rehab rationale and POC and patient is in agreement  Person educated: Patient Education method: Explanation Education comprehension: verbalized understanding and needs further education   HOME EXERCISE PROGRAM: Access Code: [email or web address] URL: [email or web address] Date: 04/06/2023 Prepared by: Gustavus Bryant   Exercises - Sit to Stand with Armchair  - 2 x daily - 5 x weekly - 1 sets - 5 reps - Standing Heel Raise with Support  - 2 x daily - 5 x weekly - 1-2 sets - 10 reps   ASSESSMENT:   CLINICAL IMPRESSION: Presents with increased edema in BLEs R>L, difficulty flexing R knee.  Pitting edema noted in BLE as well as slight redness.  Today exercies to tolerance with majority of session in supine to accommodate weakness.  Will f/u on edema/redness over upcoming visits to monitor condition.   OBJECTIVE IMPAIRMENTS: Abnormal gait, decreased activity tolerance, decreased balance, decreased endurance, decreased mobility, difficulty walking, decreased strength, and  postural dysfunction.    ACTIVITY LIMITATIONS: carrying, lifting, squatting, stairs, and locomotion level   PARTICIPATION LIMITATIONS: meal prep, cleaning, laundry, shopping, community activity, and yard work  PERSONAL FACTORS: Fitness, Past/current experiences, Time since onset of injury/illness/exacerbation, and 1 comorbidity: hepatic cirrhosis   are also affecting patient's functional outcome.    REHAB POTENTIAL: Good   CLINICAL DECISION MAKING: Stable/uncomplicated   EVALUATION COMPLEXITY: Low     GOALS: Goals reviewed with patient? No   SHORT TERM GOALS: Target date: 04/27/2023   Patient to demonstrate independence in HEP  Baseline: [id number] Goal status: INITIAL   2.  246ft ambulation with SPC and S Baseline: 9ft with SPC and SBA Goal status: INITIAL       LONG TERM GOALS: Target date: 05/18/2023     Increase BLE strength to 4/5 Baseline:  MMT Right eval Left eval  Hip flexion 4- 4-  Hip extension 4- 4-  Hip abduction 4- 4-  Hip adduction      Hip internal rotation      Hip external rotation      Knee flexion 4- 4-  Knee extension 4- 4-  Ankle dorsiflexion      Ankle plantarflexion 4- 4-    Goal status: INITIAL   2.  Increase BERG score to 50 Baseline: 44 Goal status: INITIAL   3.  Increase 30s chair stand test to 10 reps w/UE support Baseline: 7 sytands with UE support Goal status: INITIAL   4.  Patient to negotiate 16 steps with single rail, cane and appropriate step pattern Baseline: TBD Goal status: INITIAL         PLAN:   PT FREQUENCY: 1-2x/week   PT DURATION: 6 weeks   PLANNED INTERVENTIONS: Therapeutic exercises, Therapeutic activity, Neuromuscular re-education, Balance training, Gait training, Patient/Family education, Self Care, Joint mobilization, Stair training, DME instructions, Manual therapy, and Re-evaluation   PLAN FOR NEXT SESSION: HEP review and update, manual techniques as appropriate, aerobic tasks, ROM and flexibility  activities, strengthening and PREs, TPDN, gait and balance training as needed       Hildred Laser, PT 04/20/2023, 5:08 PM

## 2023-04-21 ENCOUNTER — Emergency Department (HOSPITAL_COMMUNITY): Payer: Medicare Other

## 2023-04-21 ENCOUNTER — Emergency Department (HOSPITAL_BASED_OUTPATIENT_CLINIC_OR_DEPARTMENT_OTHER)
Admit: 2023-04-21 | Discharge: 2023-04-21 | Disposition: A | Discharge status: Home / Self Care | Payer: Medicare Other | Attending: Emergency Medicine | Admitting: Emergency Medicine

## 2023-04-21 ENCOUNTER — Ambulatory Visit (INDEPENDENT_AMBULATORY_CARE_PROVIDER_SITE_OTHER): Payer: Medicare Other | Admitting: Family Medicine

## 2023-04-21 ENCOUNTER — Encounter (HOSPITAL_COMMUNITY): Payer: Self-pay

## 2023-04-21 ENCOUNTER — Inpatient Hospital Stay (HOSPITAL_COMMUNITY)
Admission: EM | Admit: 2023-04-21 | Discharge: 2023-04-27 | DRG: 871 | Disposition: A | Discharge status: Home / Self Care | Payer: Medicare Other | Source: Ambulatory Visit | Attending: Internal Medicine | Admitting: Internal Medicine

## 2023-04-21 ENCOUNTER — Encounter: Payer: Self-pay | Admitting: Family Medicine

## 2023-04-21 ENCOUNTER — Other Ambulatory Visit: Payer: Self-pay

## 2023-04-21 VITALS — BP 110/60 | HR 79 | Temp 97.9°F | Ht 64.0 in | Wt 159.2 lb

## 2023-04-21 DIAGNOSIS — Z8249 Family history of ischemic heart disease and other diseases of the circulatory system: Secondary | ICD-10-CM | POA: Diagnosis not present

## 2023-04-21 DIAGNOSIS — I471 Supraventricular tachycardia, unspecified: Secondary | ICD-10-CM | POA: Diagnosis present

## 2023-04-21 DIAGNOSIS — R161 Splenomegaly, not elsewhere classified: Secondary | ICD-10-CM | POA: Diagnosis present

## 2023-04-21 DIAGNOSIS — Z8719 Personal history of other diseases of the digestive system: Secondary | ICD-10-CM

## 2023-04-21 DIAGNOSIS — Z79890 Hormone replacement therapy (postmenopausal): Secondary | ICD-10-CM

## 2023-04-21 DIAGNOSIS — M1990 Unspecified osteoarthritis, unspecified site: Secondary | ICD-10-CM | POA: Diagnosis present

## 2023-04-21 DIAGNOSIS — Z8261 Family history of arthritis: Secondary | ICD-10-CM | POA: Diagnosis not present

## 2023-04-21 DIAGNOSIS — Z23 Encounter for immunization: Secondary | ICD-10-CM | POA: Diagnosis present

## 2023-04-21 DIAGNOSIS — N179 Acute kidney failure, unspecified: Secondary | ICD-10-CM | POA: Diagnosis present

## 2023-04-21 DIAGNOSIS — Z7952 Long term (current) use of systemic steroids: Secondary | ICD-10-CM

## 2023-04-21 DIAGNOSIS — R401 Stupor: Secondary | ICD-10-CM

## 2023-04-21 DIAGNOSIS — K7030 Alcoholic cirrhosis of liver without ascites: Secondary | ICD-10-CM | POA: Diagnosis not present

## 2023-04-21 DIAGNOSIS — J189 Pneumonia, unspecified organism: Secondary | ICD-10-CM | POA: Diagnosis present

## 2023-04-21 DIAGNOSIS — E039 Hypothyroidism, unspecified: Secondary | ICD-10-CM | POA: Diagnosis present

## 2023-04-21 DIAGNOSIS — K7682 Hepatic encephalopathy: Secondary | ICD-10-CM | POA: Diagnosis present

## 2023-04-21 DIAGNOSIS — R011 Cardiac murmur, unspecified: Secondary | ICD-10-CM | POA: Diagnosis present

## 2023-04-21 DIAGNOSIS — Z85038 Personal history of other malignant neoplasm of large intestine: Secondary | ICD-10-CM

## 2023-04-21 DIAGNOSIS — K7460 Unspecified cirrhosis of liver: Secondary | ICD-10-CM | POA: Diagnosis present

## 2023-04-21 DIAGNOSIS — L03115 Cellulitis of right lower limb: Secondary | ICD-10-CM | POA: Diagnosis present

## 2023-04-21 DIAGNOSIS — A419 Sepsis, unspecified organism: Secondary | ICD-10-CM | POA: Insufficient documentation

## 2023-04-21 DIAGNOSIS — N390 Urinary tract infection, site not specified: Secondary | ICD-10-CM | POA: Diagnosis present

## 2023-04-21 DIAGNOSIS — E038 Other specified hypothyroidism: Secondary | ICD-10-CM | POA: Diagnosis not present

## 2023-04-21 DIAGNOSIS — R4182 Altered mental status, unspecified: Secondary | ICD-10-CM | POA: Diagnosis present

## 2023-04-21 DIAGNOSIS — I864 Gastric varices: Secondary | ICD-10-CM | POA: Diagnosis present

## 2023-04-21 DIAGNOSIS — I495 Sick sinus syndrome: Secondary | ICD-10-CM | POA: Diagnosis present

## 2023-04-21 DIAGNOSIS — R6521 Severe sepsis with septic shock: Secondary | ICD-10-CM | POA: Diagnosis present

## 2023-04-21 DIAGNOSIS — Z9221 Personal history of antineoplastic chemotherapy: Secondary | ICD-10-CM | POA: Diagnosis not present

## 2023-04-21 DIAGNOSIS — K766 Portal hypertension: Secondary | ICD-10-CM | POA: Diagnosis present

## 2023-04-21 DIAGNOSIS — E2740 Unspecified adrenocortical insufficiency: Secondary | ICD-10-CM | POA: Diagnosis present

## 2023-04-21 DIAGNOSIS — Z9049 Acquired absence of other specified parts of digestive tract: Secondary | ICD-10-CM | POA: Diagnosis not present

## 2023-04-21 DIAGNOSIS — Z85828 Personal history of other malignant neoplasm of skin: Secondary | ICD-10-CM | POA: Diagnosis not present

## 2023-04-21 DIAGNOSIS — Z79899 Other long term (current) drug therapy: Secondary | ICD-10-CM

## 2023-04-21 DIAGNOSIS — M7989 Other specified soft tissue disorders: Secondary | ICD-10-CM

## 2023-04-21 DIAGNOSIS — R531 Weakness: Secondary | ICD-10-CM

## 2023-04-21 DIAGNOSIS — N178 Other acute kidney failure: Secondary | ICD-10-CM | POA: Diagnosis not present

## 2023-04-21 DIAGNOSIS — A401 Sepsis due to streptococcus, group B: Secondary | ICD-10-CM | POA: Diagnosis present

## 2023-04-21 DIAGNOSIS — I10 Essential (primary) hypertension: Secondary | ICD-10-CM | POA: Diagnosis present

## 2023-04-21 DIAGNOSIS — Z91041 Radiographic dye allergy status: Secondary | ICD-10-CM

## 2023-04-21 LAB — CBC WITH DIFFERENTIAL/PLATELET
Abs Immature Granulocytes: 0 10*3/uL (ref 0.00–0.07)
Band Neutrophils: 16 %
Basophils Absolute: 0 10*3/uL (ref 0.0–0.1)
Basophils Relative: 0 %
Eosinophils Absolute: 0 10*3/uL (ref 0.0–0.5)
Eosinophils Relative: 0 %
HCT: 30.1 % — ABNORMAL LOW (ref 36.0–46.0)
Hemoglobin: 9.9 g/dL — ABNORMAL LOW (ref 12.0–15.0)
Lymphocytes Relative: 1 %
Lymphs Abs: 0.1 10*3/uL — ABNORMAL LOW (ref 0.7–4.0)
MCH: 35 pg — ABNORMAL HIGH (ref 26.0–34.0)
MCHC: 32.9 g/dL (ref 30.0–36.0)
MCV: 106.4 fL — ABNORMAL HIGH (ref 80.0–100.0)
Monocytes Absolute: 0.5 10*3/uL (ref 0.1–1.0)
Monocytes Relative: 5 %
Neutro Abs: 9.7 10*3/uL — ABNORMAL HIGH (ref 1.7–7.7)
Neutrophils Relative %: 78 %
Platelets: 104 10*3/uL — ABNORMAL LOW (ref 150–400)
RBC: 2.83 MIL/uL — ABNORMAL LOW (ref 3.87–5.11)
RDW: 17.1 % — ABNORMAL HIGH (ref 11.5–15.5)
WBC: 10.3 10*3/uL (ref 4.0–10.5)
nRBC: 0 % (ref 0.0–0.2)

## 2023-04-21 LAB — URINALYSIS, W/ REFLEX TO CULTURE (INFECTION SUSPECTED)
Bilirubin Urine: NEGATIVE
Glucose, UA: NEGATIVE mg/dL
Ketones, ur: NEGATIVE mg/dL
Nitrite: NEGATIVE
Protein, ur: 100 mg/dL — AB
RBC / HPF: >50 RBC/hpf (ref 0–5)
Specific Gravity, Urine: 1.016 (ref 1.005–1.030)
WBC, UA: >50 WBC/hpf (ref 0–5)
pH: 5 (ref 5.0–8.0)

## 2023-04-21 LAB — COMPREHENSIVE METABOLIC PANEL
ALT: 43 U/L (ref 0–44)
AST: 57 U/L — ABNORMAL HIGH (ref 15–41)
Albumin: 2.7 g/dL — ABNORMAL LOW (ref 3.5–5.0)
Alkaline Phosphatase: 153 U/L — ABNORMAL HIGH (ref 38–126)
Anion gap: 9 (ref 5–15)
BUN: 28 mg/dL — ABNORMAL HIGH (ref 8–23)
CO2: 20 mmol/L — ABNORMAL LOW (ref 22–32)
Calcium: 9.4 mg/dL (ref 8.9–10.3)
Chloride: 104 mmol/L (ref 98–111)
Creatinine, Ser: 1.35 mg/dL — ABNORMAL HIGH (ref 0.44–1.00)
GFR, Estimated: 42 mL/min — ABNORMAL LOW (ref >60)
Glucose, Bld: 100 mg/dL — ABNORMAL HIGH (ref 70–99)
Potassium: 4.6 mmol/L (ref 3.5–5.1)
Sodium: 133 mmol/L — ABNORMAL LOW (ref 135–145)
Total Bilirubin: 2 mg/dL — ABNORMAL HIGH (ref 0.3–1.2)
Total Protein: 6 g/dL — ABNORMAL LOW (ref 6.5–8.1)

## 2023-04-21 LAB — PROTIME-INR
INR: 1.7 — ABNORMAL HIGH (ref 0.8–1.2)
Prothrombin Time: 19.7 seconds — ABNORMAL HIGH (ref 11.4–15.2)

## 2023-04-21 LAB — CORTISOL: Cortisol, Plasma: 21 ug/dL

## 2023-04-21 LAB — ETHANOL: Alcohol, Ethyl (B): <10 mg/dL (ref <10)

## 2023-04-21 LAB — LACTIC ACID, PLASMA: Lactic Acid, Venous: 4.3 mmol/L (ref 0.5–1.9)

## 2023-04-21 MED ORDER — ACETAMINOPHEN 325 MG PO TABS
650.0000 mg | ORAL_TABLET | ORAL | Status: DC | PRN
  Administered 2023-04-23: 650 mg via ORAL
  Filled 2023-04-21: qty 2

## 2023-04-21 MED ORDER — NOREPINEPHRINE 4 MG/250ML-% IV SOLN
2.0000 ug/min | INTRAVENOUS | Status: DC
  Administered 2023-04-21 – 2023-04-22 (×3): 10 ug/min via INTRAVENOUS
  Administered 2023-04-22: 8 ug/min via INTRAVENOUS
  Filled 2023-04-21 (×3): qty 250

## 2023-04-21 MED ORDER — VANCOMYCIN HCL IN DEXTROSE 1-5 GM/200ML-% IV SOLN
1000.0000 mg | Freq: Once | INTRAVENOUS | Status: AC
  Administered 2023-04-21: 1000 mg via INTRAVENOUS
  Filled 2023-04-21: qty 200

## 2023-04-21 MED ORDER — SODIUM CHLORIDE 0.9 % IV BOLUS (SEPSIS)
1000.0000 mL | Freq: Once | INTRAVENOUS | Status: AC
  Administered 2023-04-21: 1000 mL via INTRAVENOUS

## 2023-04-21 MED ORDER — ALBUMIN HUMAN 5 % IV SOLN
25.0000 g | Freq: Once | INTRAVENOUS | Status: AC
  Administered 2023-04-21: 25 g via INTRAVENOUS
  Filled 2023-04-21: qty 500

## 2023-04-21 MED ORDER — ONDANSETRON HCL 4 MG/2ML IJ SOLN
4.0000 mg | Freq: Four times a day (QID) | INTRAMUSCULAR | Status: DC | PRN
  Administered 2023-04-22: 4 mg via INTRAVENOUS
  Filled 2023-04-21: qty 2

## 2023-04-21 MED ORDER — SODIUM CHLORIDE 0.9 % IV BOLUS
1000.0000 mL | Freq: Once | INTRAVENOUS | Status: AC
  Administered 2023-04-21: 1000 mL via INTRAVENOUS

## 2023-04-21 MED ORDER — SODIUM CHLORIDE 0.9 % IV SOLN
2.0000 g | Freq: Two times a day (BID) | INTRAVENOUS | Status: DC
  Administered 2023-04-22: 2 g via INTRAVENOUS
  Filled 2023-04-21: qty 12.5

## 2023-04-21 MED ORDER — DOCUSATE SODIUM 100 MG PO CAPS
100.0000 mg | ORAL_CAPSULE | Freq: Two times a day (BID) | ORAL | Status: DC | PRN
  Administered 2023-04-23: 100 mg via ORAL
  Filled 2023-04-21: qty 1

## 2023-04-21 MED ORDER — POLYETHYLENE GLYCOL 3350 17 G PO PACK: 17.0000 g | PACK | Freq: Every day | ORAL | Status: DC | PRN

## 2023-04-21 MED ORDER — HYDROCORTISONE SOD SUC (PF) 100 MG IJ SOLR
50.0000 mg | Freq: Four times a day (QID) | INTRAMUSCULAR | Status: DC
  Administered 2023-04-21 – 2023-04-22 (×2): 50 mg via INTRAVENOUS
  Filled 2023-04-21 (×2): qty 2

## 2023-04-21 MED ORDER — SODIUM CHLORIDE 0.9 % IV SOLN
250.0000 mL | INTRAVENOUS | Status: DC
  Administered 2023-04-21: 250 mL via INTRAVENOUS

## 2023-04-21 MED ORDER — NOREPINEPHRINE 4 MG/250ML-% IV SOLN
0.0000 ug/min | INTRAVENOUS | Status: DC
  Administered 2023-04-21: 2 ug/min via INTRAVENOUS
  Administered 2023-04-21: 5 ug/min via INTRAVENOUS
  Administered 2023-04-21: 7 ug/min via INTRAVENOUS
  Filled 2023-04-21: qty 250

## 2023-04-21 MED ORDER — HEPARIN SODIUM (PORCINE) 5000 UNIT/ML IJ SOLN
5000.0000 [IU] | Freq: Three times a day (TID) | INTRAMUSCULAR | Status: DC
  Administered 2023-04-21 – 2023-04-27 (×17): 5000 [IU] via SUBCUTANEOUS
  Filled 2023-04-21 (×17): qty 1

## 2023-04-21 MED ORDER — LACTATED RINGERS IV SOLN: INTRAVENOUS | Status: AC

## 2023-04-21 MED ORDER — METRONIDAZOLE 500 MG/100ML IV SOLN
500.0000 mg | Freq: Once | INTRAVENOUS | Status: AC
  Administered 2023-04-21: 500 mg via INTRAVENOUS
  Filled 2023-04-21: qty 100

## 2023-04-21 MED ORDER — SODIUM CHLORIDE 0.9 % IV SOLN
2.0000 g | Freq: Once | INTRAVENOUS | Status: AC
  Administered 2023-04-21: 2 g via INTRAVENOUS
  Filled 2023-04-21: qty 12.5

## 2023-04-21 MED ORDER — SODIUM CHLORIDE 0.9 % IV BOLUS (SEPSIS)
250.0000 mL | Freq: Once | INTRAVENOUS | Status: AC
  Administered 2023-04-21: 250 mL via INTRAVENOUS

## 2023-04-21 MED ORDER — HYDROCORTISONE SOD SUC (PF) 100 MG IJ SOLR
100.0000 mg | Freq: Once | INTRAMUSCULAR | Status: AC
  Administered 2023-04-21: 100 mg via INTRAVENOUS
  Filled 2023-04-21: qty 2

## 2023-04-21 NOTE — Progress Notes (Signed)
eLink Physician-Brief Progress Note Patient Name: [name] DOB: Oct 11, 1951 MRN: [id number]   Date of Service  04/21/2023  HPI/Events of Note  71/F with hx of liver cirrhosis with hepatic encephalopathy, portal hypertension and gastric varices s/p TIPS in 2012, adrenal insufficiency, hypothyroidism, HTN, hx of colon CA s/p partial colectomy and chemotherapy, presenting with right leg redness and lethargy.   BP 92/36, HR 76, RR 15, O2 sats 96%   eICU Interventions  Continue empiric antibiotics, stress dose steroids.  Continue levophed to maintain MAP >65. Check procalcitonin. Follow up cultures.  Continue to trend lactate.  Heparin for DVT prophylaxis.      Intervention Category Evaluation Type: New Patient Evaluation  Larinda Buttery 04/21/2023, 11:53 PM

## 2023-04-21 NOTE — ED Notes (Signed)
ED Provider at bedside.Aware of BP. States wants pt to complete the 3rd bolus of NS and once complete will start pressers if pressure has not improved. Pt at this time lethargic, able to wake up to verbal stimuli. GCS remains unchanged

## 2023-04-21 NOTE — ED Provider Notes (Signed)
[place] EMERGENCY DEPARTMENT AT [facility] Provider Note   CSN: [id number] Arrival date & time: 04/21/23  1705     History  Chief Complaint  Patient presents with   Emesis   Altered Mental Status   Hypotension    [name] is a 72 y.o. female history of adrenal insufficiency, hepatic encephalopathy, gastric varices, here presenting with altered mental status and hypotension.  Patient was at physical therapy yesterday.  She states that her right leg feels very heavy.  She noticed some redness of the right thigh since yesterday.  She was not sure if she got bitten by a bug overnight.  The rash got worse today.  Patient was brought to PCP office for evaluation and was concern for sepsis from cellulitis.  Patient was not hypotensive in the clinic but when she got here, patient became hypotensive in the 70s.  Patient denies any chest pain or shortness of breath or abdominal pain or GI bleeding.  Per the husband who is at the bedside, she has a history of hepatic encephalopathy but this is not typical of her encephalopathy.  Patient is on Solu-Cortef at baseline but forgot to take it today.  The history is provided by the patient.       Home Medications Prior to Admission medications   Medication Sig Start Date End Date Taking? Authorizing Provider  acidophilus (RISAQUAD) CAPS capsule Take 1 capsule by mouth daily.    [provider]  CRANBERRY PO Take 1 tablet by mouth daily.    [provider]  estradiol (ESTRACE) 0.1 MG/GM vaginal cream Place 1 Applicatorful vaginally every other day. 11/09/22   [provider]  hydrocortisone (CORTEF) 5 MG tablet Take 1 tablet (5 mg total) by mouth 3 (three) times daily. 04/14/23   Eden Emms, NP  lactulose (CHRONULAC) 10 GM/15ML solution Take 45 mLs (30 g total) by mouth 3 (three) times daily. 03/13/23   Pokhrel, Rebekah Chesterfield, MD  levOCARNitine (CARNITOR) 330 MG tablet Take 330 mg by mouth 3 (three) times  daily. 05/23/22   [provider]  liver oil-zinc oxide (DESITIN) 40 % ointment Apply topically daily as needed for irritation. 03/13/23   Pokhrel, Rebekah Chesterfield, MD  Multiple Vitamin (MULTI-VITAMIN) tablet Take 1 tablet by mouth daily.    [provider]  polyethylene glycol (MIRALAX / GLYCOLAX) 17 g packet Take 17 g by mouth daily as needed for mild constipation. 03/13/23   Pokhrel, Rebekah Chesterfield, MD  rifaximin (XIFAXAN) 550 MG TABS tablet Take 550 mg by mouth 2 (two) times daily. 03/16/23 03/15/24  [provider]  spironolactone (ALDACTONE) 50 MG tablet Take 0.5 tablets (25 mg total) by mouth daily. 11/05/22 11/05/23  Joaquim Nam, MD  SYNTHROID 75 MCG tablet Take 1 tablet (75 mcg total) by mouth daily before breakfast. Must be seen in office for more refills. 03/22/23   Eden Emms, NP      Allergies    Contrast media [iodinated contrast media]    Review of Systems   Review of Systems  Gastrointestinal:  Positive for vomiting.  Skin:  Positive for rash.  Neurological:  Positive for weakness.  All other systems reviewed and are negative.   Physical Exam Updated Vital Signs BP (!) 74/35 (BP Location: Left Arm) Comment: RN aware  Pulse 84   Temp 97.8 F (36.6 C) (Oral)   Resp 12   SpO2 96%  Physical Exam Vitals and nursing note reviewed.  Constitutional:  Comments: Chronically ill and confused and ill-appearing  HENT:     Head: Normocephalic.     Nose: Nose normal.     Mouth/Throat:     Mouth: Mucous membranes are dry.  Eyes:     Extraocular Movements: Extraocular movements intact.     Pupils: Pupils are equal, round, and reactive to light.  Cardiovascular:     Rate and Rhythm: Regular rhythm. Tachycardia present.     Pulses: Normal pulses.     Heart sounds: Normal heart sounds.  Pulmonary:     Effort: Pulmonary effort is normal.     Breath sounds: Normal breath sounds.  Abdominal:     General: Abdomen is flat.     Palpations: Abdomen is soft.   Musculoskeletal:     Cervical back: Normal range of motion and neck supple.     Comments: Right thigh cellulitis.  There is some right calf tenderness as well.  There is no obvious subcutaneous air on my exam  Skin:    General: Skin is warm.  Neurological:     Comments: Slightly confused and moving all extremities     ED Results / Procedures / Treatments   Labs (all labs ordered are listed, but only abnormal results are displayed) Labs Reviewed  CULTURE, BLOOD (ROUTINE X 2)  CULTURE, BLOOD (ROUTINE X 2)  RESP PANEL BY RT-PCR (RSV, FLU A&B, COVID)  [id number]  CBC WITH DIFFERENTIAL/PLATELET  COMPREHENSIVE METABOLIC PANEL  ETHANOL  AMMONIA  CORTISOL  LACTIC ACID, PLASMA  LACTIC ACID, PLASMA  URINALYSIS, W/ REFLEX TO CULTURE (INFECTION SUSPECTED)  PROTIME-INR  I-STAT CHEM 8, ED    EKG EKG Interpretation  Date/Time:  Wednesday Apr 21 2023 17:39:15 EDT Ventricular Rate:  76 PR Interval:  233 QRS Duration: 89 QT Interval:  380 QTC Calculation: 428 R Axis:   38 Text Interpretation: Sinus rhythm Atrial premature complex Prolonged PR interval Abnormal R-wave progression, early transition No significant change since last tracing Confirmed by Richardean Canal [phone]) on 04/21/2023 5:57:15 PM  Radiology No results found.  Procedures Procedures    CRITICAL CARE Performed by: Richardean Canal   Total critical care time: 48 minutes  Critical care time was exclusive of separately billable procedures and treating other patients.  Critical care was necessary to treat or prevent imminent or life-threatening deterioration.  Critical care was time spent personally by me on the following activities: development of treatment plan with patient and/or surrogate as well as nursing, discussions with consultants, evaluation of patient's response to treatment, examination of patient, obtaining history from patient or surrogate, ordering and performing treatments and interventions, ordering and  review of laboratory studies, ordering and review of radiographic studies, pulse oximetry and re-evaluation of patient's condition.   Medications Ordered in ED Medications  sodium chloride 0.9 % bolus 1,000 mL (has no administration in time range)  hydrocortisone sodium succinate (SOLU-CORTEF) 100 MG injection 100 mg (has no administration in time range)  lactated ringers infusion (has no administration in time range)  sodium chloride 0.9 % bolus 1,000 mL (has no administration in time range)    And  sodium chloride 0.9 % bolus 1,000 mL (has no administration in time range)    And  sodium chloride 0.9 % bolus 250 mL (has no administration in time range)  ceFEPIme (MAXIPIME) 2 g in sodium chloride 0.9 % 100 mL IVPB (has no administration in time range)  metroNIDAZOLE (FLAGYL) IVPB 500 mg (has no administration in time range)  vancomycin (VANCOCIN)  IVPB 1000 mg/200 mL premix (has no administration in time range)    ED Course/ Medical Decision Making/ A&P                             Medical Decision Making [name] is a 72 y.o. female here presenting with hypotension.  I think likely sepsis from right thigh cellulitis.  Code sepsis was initiated.  Will give 30 cc/kg bolus.  Will give broad-spectrum antibiotics covering cellulitis.  Will also get x-ray to rule out air and DVT study.  Will give Solu-Cortef since patient missed her dose this morning.   7:26 PM Reviewed patient's labs and independently interpreted imaging studies.  Patient CBC showed normal white blood cell count.  However her lactate is elevated at 4.3.  DVT study is negative and x-rays did not show any free air.  However patient remains hypotensive with blood pressure in the 60s after 30 cc/kg bolus.  Patient also received broad-spectrum antibiotics.  I started Levophed and consult to the ICU for septic shock likely from cellulitis.  Problems Addressed: Cellulitis of right lower extremity: acute illness or  injury Septic shock [facility]): acute illness or injury  Amount and/or Complexity of Data Reviewed Labs: ordered. Decision-making details documented in ED Course. Radiology: ordered and independent interpretation performed. Decision-making details documented in ED Course. ECG/medicine tests: ordered and independent interpretation performed. Decision-making details documented in ED Course.  Risk Prescription drug management. Decision regarding hospitalization.    Final Clinical Impression(s) / ED Diagnoses Final diagnoses:  None    Rx / DC Orders ED Discharge Orders     None         Charlynne Pander, MD 04/21/23 Kristopher Oppenheim

## 2023-04-21 NOTE — ED Notes (Signed)
ED Provider at bedside. 

## 2023-04-21 NOTE — Progress Notes (Signed)
Rodney Wigger T. Sou Nohr, MD, CAQ Sports Medicine [facility] at [facility] [address] [place], [address]  Phone: [phone]  FAX: [phone]  [name] - 72 y.o. female  MRN [id number]  Date of Birth: [birth year]  Date: 04/21/2023  PCP: Eden Emms, NP  Referral: Eden Emms, NP  No chief complaint on file.  Subjective:   [name] is a 72 y.o. very pleasant female patient with Body mass index is 27.34 kg/m. who presents with the following:  The patient presents to me today as an urgent work in.  I have never seen the patient before, and she has observed having a great deal of difficulty getting to the examination room from the bathroom.  She walks very slowly is hunched over, and her husband has to greatly assist her with the use of a cane.  The husband initially made the appointment for her to come in because he was worried about a bug bite and some visual and textural changes that are different on the patient's right thigh.  She baseline has advanced liver disease, and she has had multiple hospitalizations for hepatic encephalopathy.  She was recently admitted from April 4 to March 13, 2023 for hepatic encephalopathy.  She has advanced cirrhosis with portal hypertension and gastric varices.  Obtunded in the exam room, very difficult time walking.  She appears to be falling asleep and somnolent throughout the examination.  At baseline, per the patient's husband who is the primary historian she is able to ambulate with minimal assistance, and she can do all activities of daily living and do normal care for her house and self.  Yesterday she was doing physical therapy and she was doing quite well and had no limitations.  PT when got out of the hospital.    Has a liver shunt.   Yesterday at PT, she was doing everything that they asked her.   This morning was calling for her husband.  Mark on the R leg like a "spider bite."    Review of Systems is noted in the HPI, as appropriate  Patient Active Problem List   Diagnosis Date Noted   Adrenal insufficiency (HCC) 03/19/2023   Protein-calorie malnutrition, severe (HCC) 02/26/2023   Acute hepatic encephalopathy (HCC) 02/25/2023   Weight loss 02/24/2023   Bowel habit changes 02/24/2023   Generalized abdominal pain 02/24/2023   Hospital discharge follow-up 02/24/2023   Weakness 01/04/2023   Frequent UTI 05/18/2022   Anemia 05/18/2022   Hypernatremia 05/04/2022   AKI (acute kidney injury) (HCC) 04/30/2022   Bradycardia 02/05/2022   Hepatic encephalopathy (HCC) 02/03/2022   Basal cell carcinoma (BCC) 05/06/2021   Cecal cancer (HCC) 05/06/2021   Hypertension 05/06/2021   COVID-19 vaccination declined 01/30/2021   Decompensated liver cirrhosis with portal HTN and gastric varices 12/04/2020   Essential hypertension 08/12/2020   Murmur, cardiac 03/19/2020   Hypothyroidism 06/30/2019   History of basal cell cancer 06/30/2019   Hx of colon cancer, stage III 11/30/2011   Esophageal varices (HCC) 06/12/2010   Portal hypertension (HCC) 01/23/2008    Past Medical History:  Diagnosis Date   Acute urinary retention 05/04/2022   Allergy 2006 ?   Contrast dye   Arthritis 2016 ??   Knees and thumb   Cancer [facility])    cecum   Cataract 2021   Surgery scheduled July 2023   Colon cancer [facility]) 2003   Elevated liver function tests    Esophageal varices (  HCC)    Heart murmur On file   Hemorrhage of gastrointestinal tract 05/04/2011   Hypertension 2021   Hypothyroidism    Iron deficiency anemia    Liver disease    chemotherapy complication, per pt, shunts placed to bypass liver   Malignant neoplasm of cecum (HCC)    Portal hypertension (HCC)    Skin cancer 2019   Splenomegaly     Past Surgical History:  Procedure Laterality Date   COLON SURGERY  2004   Cancer   COSMETIC SURGERY  2021   Skin cancer   ESOPHAGEAL VARICE LIGATION     EYE SURGERY      HEMICOLECTOMY  01/08/2003   IR RADIOLOGIST EVAL & MGMT  12/20/2020   IR RADIOLOGIST EVAL & MGMT  05/29/2021   LIVER SURGERY     shunts placed after chemo complication   SKIN FULL THICKNESS GRAFT N/A 09/12/2019   Procedure: debridement and FTSG to the nose from left upper arm;  Surgeon: Allena Napoleon, MD;  Location: [facility];  Service: Plastics;  Laterality: N/A;  2 hours, please   TIPS PROCEDURE      Family History  Problem Relation Age of Onset   Arthritis Mother    Hearing loss Mother    Heart disease Mother    Hypertension Mother    Miscarriages / [place] Mother    Arthritis Father    Diabetes Father    Heart disease Father    Cancer Maternal Aunt    Breast cancer Neg Hx    Colon cancer Neg Hx    Esophageal cancer Neg Hx    Pancreatic cancer Neg Hx    Stomach cancer Neg Hx     Social History   Social History Narrative   12/04/20   From: the area   Living: with husband, [name] (1994)   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 PULM: Breathing comfortably in no respiratory distress PSYCH: Obtunded.  Limited interaction. Falling asleep sitting in the exam room Ambulates with great assistance and is hunched over while walking, requires extensive assistance by her husband.    Laboratory and Imaging Data:  Assessment and Plan:     ICD-10-CM   1. Altered mental status, unspecified altered mental status type  R41.82     2. Cellulitis of leg, right  L03.115     3.  Alcoholic cirrhosis of liver without ascites (HCC)  K70.30     4. Hepatic encephalopathy (HCC)  K76.82     5. Obtunded  R40.1      The patient appears acutely ill.  Mental status is decreased, and she is having a hard time walking, staying seated in a position, and she is not able to answer questions well in the exam room.  Her husband provides much of the history.  In brief, she does have advanced liver disease.  She has had multiple episodes of hepatic encephalopathy requiring hospitalization.  She has decompensated rather quickly in the order of the last 3 to 4 hours per the patient's husband.  She does appear to have cellulitis in the right thigh, however this is not a simple cellulitis case, and I do not think her overall mental state and lack of physical function is likely due to simple cellulitis.  Question ongoing hepatic encephalopathy versus worsening acute infection.  The patient needs a higher level of care.  [facility] are going to [facility] emergency room, and I have already called the ED to alert them of her arrival.  Disposition: ER  Dr[employer] One speech-to-text software was used for transcription in this dictation.  Possible transcriptional errors can occur using Animal nutritionist.   Signed,  Elpidio Galea. Harvard Zeiss, MD   Outpatient Encounter Medications as of 04/21/2023  Medication Sig   acidophilus (RISAQUAD) CAPS capsule Take 1 capsule by mouth daily.   CRANBERRY PO Take 1 tablet by mouth daily.   estradiol (ESTRACE) 0.1 MG/GM vaginal cream Place 1 Applicatorful vaginally every other day.   hydrocortisone (CORTEF) 5 MG tablet Take 1 tablet (5 mg total) by mouth 3 (three) times daily.   lactulose (CHRONULAC) 10 GM/15ML solution Take 45 mLs (30 g total) by mouth 3 (three) times daily.   levOCARNitine (CARNITOR) 330 MG tablet Take 330 mg by mouth 3 (three) times daily.   liver oil-zinc oxide (DESITIN) 40 % ointment Apply topically daily as needed for irritation.    Multiple Vitamin (MULTI-VITAMIN) tablet Take 1 tablet by mouth daily.   polyethylene glycol (MIRALAX / GLYCOLAX) 17 g packet Take 17 g by mouth daily as needed for mild constipation.   rifaximin (XIFAXAN) 550 MG TABS tablet Take 550 mg by mouth 2 (two) times daily.   spironolactone (ALDACTONE) 50 MG tablet Take 0.5 tablets (25 mg total) by mouth daily.   SYNTHROID 75 MCG tablet Take 1 tablet (75 mcg total) by mouth daily before breakfast. Must be seen in office for more refills.   No facility-administered encounter medications on file as of 04/21/2023.

## 2023-04-21 NOTE — ED Triage Notes (Signed)
C/o n/v, visual changes, and AMS. Hx hepatic encephalopathy and gastric varices.  Hypotension noted in triage.  Brusing noted to thigh

## 2023-04-21 NOTE — Progress Notes (Signed)
Right lower extremity venous duplex has been completed. Preliminary results can be found in CV Proc through chart review.  Results were given to Dr. Silverio Lay.  04/21/23 6:22 PM Olen Cordial RVT

## 2023-04-21 NOTE — Progress Notes (Signed)
A consult was received from an ED physician for cefepime and vancomycin per pharmacy dosing.  The patient's profile has been reviewed for ht/wt/allergies/indication/available labs.    Agree with one time orders already placed by provider for cefepime 2 g IV and vancomycin 1000 mg IV.    Further antibiotics/pharmacy consults should be ordered by admitting physician if indicated.                       Thank you, Lynden Ang, PharmD, BCPS 04/21/2023  5:44 PM

## 2023-04-21 NOTE — Progress Notes (Signed)
Pharmacy Antibiotic Note  Miamore Barbarito is a 72 y.o. female admitted on 04/21/2023 with cellulitis.  Pharmacy has been consulted for cellulitis dosing.  Plan: Cefepime 2g VI q12 per current renal function     Temp (24hrs), Avg:97.9 F (36.6 C), Min:97.8 F (36.6 C), Max:97.9 F (36.6 C)  Recent Labs  Lab 04/21/23 1750  WBC 10.3  CREATININE 1.35*  LATICACIDVEN 4.3*    Estimated Creatinine Clearance: 37.2 mL/min (A) (by C-G formula based on SCr of 1.35 mg/dL (H)).    Allergies  Allergen Reactions   Contrast Media [Iodinated Contrast Media] Hives     Thank you for allowing pharmacy to be a part of this patient's care.  Berkley Harvey 04/21/2023 9:06 PM

## 2023-04-21 NOTE — H&P (Signed)
NAME:  [name], MRN:  [id number], DOB:  Sep 10, 1951, LOS: 0 ADMISSION DATE:  04/21/2023, CONSULTATION DATE:  04/21/23 REFERRING MD:  Chaney Malling, MD  CHIEF COMPLAINT:  Septic shock   History of Present Illness:  [name] is a 72 year old with the below medical history who presents with altered mental status. She participated in physical therapy yesterday and had right leg redness and heaviness. She presented to PCP for concern for insect bite and found with decreased and altered mental status. Advised to present to ED for evaluation. Started on levophed for hypotension. LA 4.3 Started on Cefepime and Vanc. DVT negative. CXR no infiltrate with no significant effusion. PCCM consulted for admission  Recently hospitalized in April 2024 with acute hepatic encephalopathy in setting with hyperammonemia. Husband reports her mental status is at baseline right now.  Pertinent  Medical History  Liver cirrhosis with hx HE, portal HTN and gastric varices s/p TIPS in 2012, adrenal insufficiency, hypothyroidism, HTN, hx colon cancer s/p partial colectomy and chemotherapy  Significant Hospital Events: Including procedures, antibiotic start and stop dates in addition to other pertinent events     Interim History / Subjective:  As above  Objective   Blood pressure (!) 70/39, pulse 73, temperature 97.8 F (36.6 C), temperature source Oral, resp. rate 16, SpO2 92 %.        Intake/Output Summary (Last 24 hours) at 04/21/2023 1931 Last data filed at 04/21/2023 1854 Gross per 24 hour  Intake 2100 ml  Output --  Net 2100 ml   There were no vitals filed for this visit.  Physical Exam: General: Chronically ill-appearing, pale, no acute distress HENT: , AT, OP clear, MMM Eyes: EOMI, no scleral icterus Respiratory: Clear to auscultation bilaterally.  No crackles, wheezing or rales Cardiovascular: RRR, -M/R/G, no JVD GI: BS+, soft, nontender Extremities: 1+ lower extremity  edema,-tenderness Neuro: AAO x4, CNII-XII grossly intact Skin: RLE erythematous streaking on upper thigh   Resolved Hospital Problem list   N/A  Assessment & Plan:  Septic shock secondary to suspected cellulitis S/p 3.25L NS Wean levophed for MAP goal >65 Albumin x 1 followed by LR 150 cc x 20 hours Continue Cefepime and Vanc. 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 Followed by [facility] Restart lactulose when able Trend LFTs  Possible adrenal insufficiency Stress dose steroids Referred to Endocrine as an outpatient  Hx tachybrady syndrome, SVT HTN Telemetry Hold home anti-hypertensive agents and diuretics Outpatient cardiac monitoring  Acute on chronic mild thrombocytopenia - 2/2 sepsis, cirrosis Trend  Best Practice (right click and "Reselect all SmartList Selections" daily)   Diet/type: NPO DVT prophylaxis:  GI prophylaxis: N/A Lines: N/A Foley:  N/A Code Status:  full code Last date of multidisciplinary goals of care discussion [ ]   Labs   CBC: Recent Labs  Lab 04/21/23 1750  WBC 10.3  NEUTROABS 9.7*  HGB 9.9*  HCT 30.1*  MCV 106.4*  PLT 104*    Basic Metabolic Panel: Recent Labs  Lab 04/21/23 1750  NA 133*  K 4.6  CL 104  CO2 20*  GLUCOSE 100*  BUN 28*  CREATININE 1.35*  CALCIUM 9.4   GFR: Estimated Creatinine Clearance: 37.2 mL/min (  A) (by C-G formula based on SCr of 1.35 mg/dL (H)). Recent Labs  Lab 04/21/23 1750  WBC 10.3  LATICACIDVEN 4.3*    Liver Function Tests: Recent Labs  Lab 04/21/23 1750  AST 57*  ALT 43  ALKPHOS 153*  BILITOT 2.0*  PROT 6.0*  ALBUMIN 2.7*   No results for input(s): "LIPASE",  "AMYLASE" in the last 168 hours. No results for input(s): "AMMONIA" in the last 168 hours.  ABG    Component Value Date/Time   PHART 7.48 (H) 04/30/2022 1600   PCO2ART 41 04/30/2022 1600   PO2ART 105 04/30/2022 1600   HCO3 30.5 (H) 04/30/2022 1600   TCO2 17.6 12/19/2010 2150   ACIDBASEDEF 2.0 12/19/2010 2150   O2SAT 98.9 04/30/2022 1600     Coagulation Profile: Recent Labs  Lab 04/21/23 1750  INR 1.7*    Cardiac Enzymes: No results for input(s): "CKTOTAL", "CKMB", "CKMBINDEX", "TROPONINI" in the last 168 hours.  HbA1C: Hgb A1c MFr Bld  Date/Time Value Ref Range Status  08/14/2020 07:45 AM 5.1 4.8 - 5.6 % Final    Comment:    (NOTE) Pre diabetes:          5.7%-6.4%  Diabetes:              >6.4%  Glycemic control for   <7.0% adults with diabetes     CBG: No results for input(s): "GLUCAP" in the last 168 hours.  Review of Systems:   Review of Systems  Constitutional:  Positive for malaise/fatigue. Negative for chills, diaphoresis, fever and weight loss.  HENT:  Negative for congestion.   Respiratory:  Negative for cough, hemoptysis, sputum production, shortness of breath and wheezing.   Cardiovascular:  Negative for chest pain, palpitations and leg swelling.     Past Medical History:  She,  has a past medical history of Acute urinary retention (05/04/2022), Allergy (2006 ?), Arthritis (2016 ??), Cancer [facility]), Cataract (2021), Colon cancer (HCC) (2003), Elevated liver function tests, Esophageal varices (HCC), Heart murmur (On file), Hemorrhage of gastrointestinal tract (05/04/2011), Hypertension (2021), Hypothyroidism, Iron deficiency anemia, Liver disease, Malignant neoplasm of cecum (HCC), Portal hypertension (HCC), Skin cancer (2019), and Splenomegaly.   Surgical History:   Past Surgical History:  Procedure Laterality Date   COLON SURGERY  2004   Cancer   COSMETIC SURGERY  2021   Skin cancer   ESOPHAGEAL VARICE LIGATION     EYE SURGERY     HEMICOLECTOMY   01/08/2003   IR RADIOLOGIST EVAL & MGMT  12/20/2020   IR RADIOLOGIST EVAL & MGMT  05/29/2021   LIVER SURGERY     shunts placed after chemo complication   SKIN FULL THICKNESS GRAFT N/A 09/12/2019   Procedure: debridement and FTSG to the nose from left upper arm;  Surgeon: Allena Napoleon, MD;  Location: [facility];  Service: Plastics;  Laterality: N/A;  2 hours, please   TIPS PROCEDURE       Social History:   reports that she has never smoked. She has never used smokeless tobacco. She reports that she does not drink alcohol and does not use drugs.   Family History:  Her family history includes Arthritis in her father and mother; Cancer in her maternal aunt; Diabetes in her father; Hearing loss in her mother; Heart disease in her father and mother; Hypertension in her mother; Miscarriages / [place] in her mother. There is no history of Breast cancer, Colon cancer, Esophageal cancer, Pancreatic cancer, or Stomach cancer.  Allergies Allergies  Allergen Reactions   Contrast Media [Iodinated Contrast Media] Hives     Home Medications  Prior to Admission medications   Medication Sig Start Date End Date Taking? Authorizing Provider  acidophilus (RISAQUAD) CAPS capsule Take 1 capsule by mouth daily.    [provider]  CRANBERRY PO Take 1 tablet by mouth daily.    [provider]  estradiol (ESTRACE) 0.1 MG/GM vaginal cream Place 1 Applicatorful vaginally every other day. 11/09/22   [provider]  hydrocortisone (CORTEF) 5 MG tablet Take 1 tablet (5 mg total) by mouth 3 (three) times daily. 04/14/23   Eden Emms, NP  lactulose (CHRONULAC) 10 GM/15ML solution Take 45 mLs (30 g total) by mouth 3 (three) times daily. 03/13/23   Pokhrel, Rebekah Chesterfield, MD  levOCARNitine (CARNITOR) 330 MG tablet Take 330 mg by mouth 3 (three) times daily. 05/23/22   [provider]  liver oil-zinc oxide (DESITIN) 40 % ointment Apply topically daily as needed for  irritation. 03/13/23   Pokhrel, Rebekah Chesterfield, MD  Multiple Vitamin (MULTI-VITAMIN) tablet Take 1 tablet by mouth daily.    [provider]  polyethylene glycol (MIRALAX / GLYCOLAX) 17 g packet Take 17 g by mouth daily as needed for mild constipation. 03/13/23   Pokhrel, Rebekah Chesterfield, MD  rifaximin (XIFAXAN) 550 MG TABS tablet Take 550 mg by mouth 2 (two) times daily. 03/16/23 03/15/24  [provider]  spironolactone (ALDACTONE) 50 MG tablet Take 0.5 tablets (25 mg total) by mouth daily. 11/05/22 11/05/23  Joaquim Nam, MD  SYNTHROID 75 MCG tablet Take 1 tablet (75 mcg total) by mouth daily before breakfast. Must be seen in office for more refills. 03/22/23   Eden Emms, NP     Critical care time: 45 min     The patient is critically ill with multiple organ systems failure and requires high complexity decision making for assessment and support, frequent evaluation and titration of therapies, application of advanced monitoring technologies and extensive interpretation of multiple databases.   Mechele Collin, M.D. [facility] Pulmonary/Critical Care Medicine 04/21/2023 9:03 PM   Please see Amion for pager number to reach on-call Pulmonary and Critical Care Team.

## 2023-04-21 NOTE — Sepsis Progress Note (Signed)
Elink will follow per sepsis protocol  

## 2023-04-22 ENCOUNTER — Ambulatory Visit: Payer: Medicare Other

## 2023-04-22 ENCOUNTER — Inpatient Hospital Stay (HOSPITAL_COMMUNITY): Payer: Medicare Other

## 2023-04-22 DIAGNOSIS — I10 Essential (primary) hypertension: Secondary | ICD-10-CM | POA: Diagnosis not present

## 2023-04-22 DIAGNOSIS — R6521 Severe sepsis with septic shock: Secondary | ICD-10-CM | POA: Diagnosis not present

## 2023-04-22 DIAGNOSIS — A419 Sepsis, unspecified organism: Secondary | ICD-10-CM | POA: Diagnosis not present

## 2023-04-22 DIAGNOSIS — I4719 Other supraventricular tachycardia: Secondary | ICD-10-CM | POA: Diagnosis not present

## 2023-04-22 LAB — URINE CULTURE: Culture: >=100000 — AB

## 2023-04-22 LAB — BASIC METABOLIC PANEL
Anion gap: 9 (ref 5–15)
BUN: 29 mg/dL — ABNORMAL HIGH (ref 8–23)
CO2: 18 mmol/L — ABNORMAL LOW (ref 22–32)
Calcium: 8.5 mg/dL — ABNORMAL LOW (ref 8.9–10.3)
Chloride: 104 mmol/L (ref 98–111)
Creatinine, Ser: 1.33 mg/dL — ABNORMAL HIGH (ref 0.44–1.00)
GFR, Estimated: 43 mL/min — ABNORMAL LOW (ref >60)
Glucose, Bld: 103 mg/dL — ABNORMAL HIGH (ref 70–99)
Potassium: 4.7 mmol/L (ref 3.5–5.1)
Sodium: 131 mmol/L — ABNORMAL LOW (ref 135–145)

## 2023-04-22 LAB — CBC
HCT: 31.6 % — ABNORMAL LOW (ref 36.0–46.0)
Hemoglobin: 10.5 g/dL — ABNORMAL LOW (ref 12.0–15.0)
MCH: 35.1 pg — ABNORMAL HIGH (ref 26.0–34.0)
MCHC: 33.2 g/dL (ref 30.0–36.0)
MCV: 105.7 fL — ABNORMAL HIGH (ref 80.0–100.0)
Platelets: 143 10*3/uL — ABNORMAL LOW (ref 150–400)
RBC: 2.99 MIL/uL — ABNORMAL LOW (ref 3.87–5.11)
RDW: 17.1 % — ABNORMAL HIGH (ref 11.5–15.5)
WBC: 22.7 10*3/uL — ABNORMAL HIGH (ref 4.0–10.5)
nRBC: 0 % (ref 0.0–0.2)

## 2023-04-22 LAB — BLOOD CULTURE ID PANEL (REFLEXED) - BCID2

## 2023-04-22 LAB — MAGNESIUM: Magnesium: 1.2 mg/dL — ABNORMAL LOW (ref 1.7–2.4)

## 2023-04-22 LAB — PROCALCITONIN: Procalcitonin: 25.47 ng/mL

## 2023-04-22 LAB — ECHOCARDIOGRAM LIMITED
AR max vel: 1.74 cm2
AV Area VTI: 1.85 cm2
AV Area mean vel: 1.74 cm2
AV Mean grad: 8 mmHg
AV Peak grad: 14.3 mmHg
Ao pk vel: 1.89 m/s
Area-P 1/2: 3.65 cm2
Calc EF: 52.7 %
Est EF: 55
Height: 64 in
MV VTI: 1.7 cm2
S' Lateral: 3.1 cm
Single Plane A2C EF: 51.7 %
Single Plane A4C EF: 55.2 %
Weight: 2684.32 oz

## 2023-04-22 LAB — HEPATIC FUNCTION PANEL
ALT: 41 U/L (ref 0–44)
AST: 56 U/L — ABNORMAL HIGH (ref 15–41)
Albumin: 3 g/dL — ABNORMAL LOW (ref 3.5–5.0)
Alkaline Phosphatase: 106 U/L (ref 38–126)
Bilirubin, Direct: 1 mg/dL — ABNORMAL HIGH (ref 0.0–0.2)
Indirect Bilirubin: 2 mg/dL — ABNORMAL HIGH (ref 0.3–0.9)
Total Bilirubin: 3 mg/dL — ABNORMAL HIGH (ref 0.3–1.2)
Total Protein: 6.4 g/dL — ABNORMAL LOW (ref 6.5–8.1)

## 2023-04-22 LAB — LACTIC ACID, PLASMA: Lactic Acid, Venous: 2.8 mmol/L (ref 0.5–1.9)

## 2023-04-22 LAB — PHOSPHORUS: Phosphorus: 4 mg/dL (ref 2.5–4.6)

## 2023-04-22 LAB — CULTURE, BLOOD (ROUTINE X 2)

## 2023-04-22 LAB — AMMONIA: Ammonia: 59 umol/L — ABNORMAL HIGH (ref 9–35)

## 2023-04-22 MED ORDER — CHLORHEXIDINE GLUCONATE CLOTH 2 % EX PADS
6.0000 | MEDICATED_PAD | Freq: Every day | CUTANEOUS | Status: DC
  Administered 2023-04-22 – 2023-04-23 (×2): 6 via TOPICAL

## 2023-04-22 MED ORDER — ORAL CARE MOUTH RINSE: 15.0000 mL | OROMUCOSAL | Status: DC | PRN

## 2023-04-22 MED ORDER — MAGNESIUM SULFATE 2 GM/50ML IV SOLN
2.0000 g | Freq: Once | INTRAVENOUS | Status: AC
  Administered 2023-04-22: 2 g via INTRAVENOUS
  Filled 2023-04-22: qty 50

## 2023-04-22 MED ORDER — VANCOMYCIN HCL 1500 MG/300ML IV SOLN
1500.0000 mg | INTRAVENOUS | Status: DC
  Filled 2023-04-22: qty 300

## 2023-04-22 MED ORDER — VANCOMYCIN HCL IN DEXTROSE 1-5 GM/200ML-% IV SOLN: 1000.0000 mg | Freq: Once | INTRAVENOUS | Status: DC

## 2023-04-22 MED ORDER — VANCOMYCIN HCL 750 MG/150ML IV SOLN
750.0000 mg | INTRAVENOUS | Status: DC
  Administered 2023-04-22: 750 mg via INTRAVENOUS
  Filled 2023-04-22: qty 150

## 2023-04-22 MED ORDER — CEFAZOLIN SODIUM-DEXTROSE 2-4 GM/100ML-% IV SOLN
2.0000 g | Freq: Three times a day (TID) | INTRAVENOUS | Status: DC
  Administered 2023-04-22 – 2023-04-26 (×12): 2 g via INTRAVENOUS
  Filled 2023-04-22 (×13): qty 100

## 2023-04-22 MED ORDER — ALBUMIN HUMAN 5 % IV SOLN
25.0000 g | Freq: Once | INTRAVENOUS | Status: AC
  Administered 2023-04-22: 25 g via INTRAVENOUS
  Filled 2023-04-22: qty 500

## 2023-04-22 MED ORDER — MAGNESIUM SULFATE 4 GM/100ML IV SOLN
4.0000 g | Freq: Once | INTRAVENOUS | Status: AC
  Administered 2023-04-22: 4 g via INTRAVENOUS
  Filled 2023-04-22: qty 100

## 2023-04-22 MED ORDER — HYDROCORTISONE SOD SUC (PF) 100 MG IJ SOLR
100.0000 mg | Freq: Three times a day (TID) | INTRAMUSCULAR | Status: DC
  Administered 2023-04-22 – 2023-04-23 (×3): 100 mg via INTRAVENOUS
  Filled 2023-04-22 (×3): qty 2

## 2023-04-22 MED ORDER — LACTATED RINGERS IV BOLUS
500.0000 mL | Freq: Once | INTRAVENOUS | Status: AC
  Administered 2023-04-22: 500 mL via INTRAVENOUS

## 2023-04-22 NOTE — Progress Notes (Signed)
   NAME:  [name], MRN:  [id number], DOB:  March 04, 1951, LOS: 1 ADMISSION DATE:  04/21/2023, CONSULTATION DATE:  04/21/23 REFERRING MD:  Chaney Malling, MD  CHIEF COMPLAINT:  Septic shock   History of Present Illness:  [name] is a 72 year old with the below medical history who presents with altered mental status. She participated in physical therapy yesterday and had right leg redness and heaviness. She presented to PCP for concern for insect bite and found with decreased and altered mental status. Advised to present to ED for evaluation. Started on levophed for hypotension. LA 4.3 Started on Cefepime and Vanc. DVT negative. CXR no infiltrate with no significant effusion. PCCM consulted for admission  Recently hospitalized in April 2024 with acute hepatic encephalopathy in setting with hyperammonemia. Husband reports her mental status is at baseline right now.  Pertinent  Medical History  Liver cirrhosis with hx HE, portal HTN and gastric varices s/p TIPS in 2012, adrenal insufficiency, hypothyroidism, HTN, hx colon cancer s/p partial colectomy and chemotherapy  Significant Hospital Events: Including procedures, antibiotic start and stop dates in addition to other pertinent events   5/29 admit  Interim History / Subjective:  No events. Feels a little better this am. LE duplex neg but limited study somewhat  Objective   Blood pressure 118/64, pulse 69, temperature (!) 96.6 F (35.9 C), resp. rate 13, height 5\' 4"  (1.626 m), weight 76.1 kg, SpO2 95 %.        Intake/Output Summary (Last 24 hours) at 04/22/2023 0718 Last data filed at 04/22/2023 4098 Gross per 24 hour  Intake 5460.62 ml  Output 165 ml  Net 5295.62 ml    Filed Weights   04/21/23 2311 04/22/23 0320  Weight: 76.1 kg 76.1 kg    Physical Exam: Chronically ill no distress MMM, trachea midline Heart sounds regular, +HSM over precordium Moves to command RASS -1 Abd soft Very mild RLE swelling, redness on 2nd R  toe from recent trauma  4/19 cortisol tests reviewed, seems close to adrenal insufficiency  Pct is up UA  looks + for infection  Resolved Hospital Problem list   N/A  Assessment & Plan:  Septic shock- sources include RLE although exam pretty benign vs. 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  34 min cc time Myrla Halsted MD PCCM

## 2023-04-22 NOTE — TOC Initial Note (Deleted)
Transition of Care [facility]) - Initial/Assessment Note    Patient Details  Name: [name] MRN: [id number] Date of Birth: October 26, 1951  Transition of Care [facility]) CM/SW Contact:    Adrian Prows, RN Phone Number: 04/22/2023, 10:47 AM  Clinical Narrative:                 Completed brief assessment w/ patient's dtr [name]; no TOC needs identified.        Patient Goals and CMS Choice            Expected Discharge Plan and Services                                              Prior Living Arrangements/Services                       Activities of Daily Living Home Assistive Devices/Equipment: Brace (specify type) (left wrist) ADL Screening (condition at time of admission) Patient's cognitive ability adequate to safely complete daily activities?: Yes Is the patient deaf or have difficulty hearing?: No Does the patient have difficulty seeing, even when wearing glasses/contacts?: No Does the patient have difficulty concentrating, remembering, or making decisions?: No Patient able to express need for assistance with ADLs?: Yes Does the patient have difficulty dressing or bathing?: No Independently performs ADLs?: Yes (appropriate for developmental age) Does the patient have difficulty walking or climbing stairs?: Yes Weakness of Legs: Both Weakness of Arms/Hands: None  Permission Sought/Granted                  Emotional Assessment              Admission diagnosis:  Cellulitis of right lower extremity [L03.115] Septic shock (HCC) [A41.9, R65.21] Patient Active Problem List   Diagnosis Date Noted   Septic shock (HCC) 04/21/2023   Adrenal insufficiency (HCC) 03/19/2023   Protein-calorie malnutrition, severe (HCC) 02/26/2023   Acute hepatic encephalopathy (HCC) 02/25/2023   Weight loss 02/24/2023   Bowel habit changes 02/24/2023   Generalized abdominal pain 02/24/2023   Hospital discharge follow-up 02/24/2023    Weakness 01/04/2023   Frequent UTI 05/18/2022   Anemia 05/18/2022   Hypernatremia 05/04/2022   AKI (acute kidney injury) (HCC) 04/30/2022   Bradycardia 02/05/2022   Hepatic encephalopathy (HCC) 02/03/2022   Basal cell carcinoma (BCC) 05/06/2021   Cecal cancer (HCC) 05/06/2021   Hypertension 05/06/2021   COVID-19 vaccination declined 01/30/2021   Decompensated liver cirrhosis with portal HTN and gastric varices 12/04/2020   Essential hypertension 08/12/2020   Murmur, cardiac 03/19/2020   Hypothyroidism 06/30/2019   History of basal cell cancer 06/30/2019   Hx of colon cancer, stage III 11/30/2011   Esophageal varices (HCC) 06/12/2010   Portal hypertension (HCC) 01/23/2008   PCP:  Eden Emms, NP Pharmacy:   [facility] - [place], [place] - [address] [place] [address] Phone: [phone] Fax: [phone]     Social Determinants of Health (SDOH) Social History: SDOH Screenings   Food Insecurity: No Food Insecurity (04/22/2023)  Housing: Low Risk  (04/22/2023)  Transportation Needs: No Transportation Needs (04/22/2023)  Utilities: Not At Risk (04/22/2023)  Alcohol Screen: Low Risk  (03/24/2023)  Depression (PHQ2-9): Low Risk  (03/24/2023)  Financial Resource Strain: Low Risk  (03/24/2023)  Physical Activity: Inactive (03/24/2023)  Social Connections: Moderately Integrated (03/24/2023)  Stress: No Stress Concern Present (03/24/2023)  Tobacco Use: Low Risk  (04/21/2023)   SDOH Interventions:     Readmission Risk Interventions    03/02/2023    3:19 PM  Readmission Risk Prevention Plan  Transportation Screening Complete  PCP or Specialist Appt within 3-5 Days Complete  HRI or Home Care Consult Complete  Social Work Consult for Recovery Care Planning/Counseling Complete  Medication Review Ocean[employer]) Complete

## 2023-04-22 NOTE — Progress Notes (Addendum)
eLink Physician-Brief Progress Note Patient Name: [name] DOB: [birth year] MRN: [id number]   Date of Service  04/22/2023  HPI/Events of Note  Notified of hypotension despite max dose of peripheral levophed.  BP 97/49, HR 74.  eICU Interventions  Albumin is being given now.  IV team contacted by bedside RN for PICC or midline placement.  RN to callback if unable to insert any of these.      Intervention Category Intermediate Interventions: Hypotension - evaluation and management  Larinda Buttery 04/22/2023, 1:11 AM  2:27 AM BP improved now at 105/51, MAP 69, HR 68.   Plan> Continue peripheral levophed for now.  Rounding team to assess need for central line placement.  Continue IVFs.

## 2023-04-22 NOTE — Progress Notes (Signed)
Transition of Care [facility]) - Inpatient Brief Assessment   Patient Details  Name: [name] MRN: [id number] Date of Birth: 16-Dec-1950  Transition of Care [facility]) CM/SW Contact:    Adrian Prows, RN Phone Number: 04/22/2023, 1:06 PM   Clinical Narrative: Sherron Monday w/ pt's dtr [name]; no TOC needs.   Transition of Care Asessment: Insurance and Status: Insurance coverage has been reviewed Patient has primary care physician: Yes Home environment has been reviewed: home w/ spouse Prior level of function:: independent Prior/Current Home Services: No current home services Social Determinants of Health Reivew: SDOH reviewed no interventions necessary Readmission risk has been reviewed: Yes Transition of care needs: no transition of care needs at this time

## 2023-04-22 NOTE — Progress Notes (Signed)
eLink Physician-Brief Progress Note Patient Name: [name] DOB: July 20, 1951 MRN: [id number]   Date of Service  04/22/2023  HPI/Events of Note  Patient's husband requesting that patient be started back on lactulose for hyperammonemia.  She is in the ICU with septic shock and remains on pressors.  eICU Interventions  Spoke to patient's husband at bedside.  With her continued dependence on pressors, it would not be wise to take volume off her using lactulose.  Currently she is awake and oriented for me.  Will hold off on lactulose until she is more hemodynamically stable.  Both patient and husband verbalized understanding.     Intervention Category Minor Interventions: Other:  Carilyn Goodpasture 04/22/2023, 11:13 PM

## 2023-04-22 NOTE — Progress Notes (Signed)
Bcx d/w pharmD  -- 3 out of 4  strep agalactiae  Will narrow to ancef  Repeat bcx tomorrow    Tessie Fass MSN, AGACNP-BC [facility] Pulmonary/Critical Care Medicine 04/22/2023, 11:16 AM

## 2023-04-22 NOTE — Progress Notes (Signed)
[facility] ADULT ICU REPLACEMENT PROTOCOL   The patient does apply for the [facility] Adult ICU Electrolyte Replacment Protocol based on the criteria listed below:   1.Exclusion criteria: TCTS, ECMO, Dialysis, and Myasthenia Gravis patients 2. Is GFR >/= 30 ml/min? Yes.    Patient's GFR today is 43 3. Is SCr </= 2? Yes.   Patient's SCr is 1.33 mg/dL 4. Did SCr increase >/= 0.5 in 24 hours? No. 5.Pt's weight >40kg  Yes.   6. Abnormal electrolyte(s):   Mg 1.2  7. Electrolytes replaced per protocol 8.  Call MD STAT for K+ </= 2.5, Phos </= 1, or Mag </= 1 Physician:  V. Myrtice Lauth R Roniel Halloran 04/22/2023 5:28 AM

## 2023-04-22 NOTE — Progress Notes (Signed)
An USGPIV (ultrasound guided PIV) has been placed for short-term vasopressor infusion. A correctly placed ivWatch must be used when administering Vasopressors. Should this treatment be needed beyond 72 hours, central line access should be obtained.  It will be the responsibility of the bedside nurse to follow best practice to prevent extravasations.   

## 2023-04-22 NOTE — Progress Notes (Signed)
Pharmacy Antibiotic Note  [name] is a 72 y.o. female admitted on 04/21/2023 with sepsis.  Pharmacy has been consulted for vancomycin and cefepime dosing.  Today, 04/22/23 WBC increased. Noted on stress-dose steroids SCr slightly elevated but stable ~1.3 Pt requiring pressors  Plan: Continue cefepime 2 g IV q12h Vancomycin 1000 mg dose given last night. Will initiate maintenance dose of 750 mg IV q24h for estimated AUC of 425 Goal AUC 400-550. Check at steady state as needed Monitor renal function, culture data  Height: 5\' 4"  (162.6 cm) Weight: 76.1 kg (167 lb 12.3 oz) IBW/kg (Calculated) : 54.7  Temp (24hrs), Avg:96.6 F (35.9 C), Min:95.1 F (35.1 C), Max:97.9 F (36.6 C)  Recent Labs  Lab 04/21/23 1750 04/21/23 2325 04/22/23 0315  WBC 10.3  --  22.7*  CREATININE 1.35*  --  1.33*  LATICACIDVEN 4.3* 2.8*  --     Estimated Creatinine Clearance: 38.8 mL/min (A) (by C-G formula based on SCr of 1.33 mg/dL (H)).    Allergies  Allergen Reactions   Contrast Media [Iodinated Contrast Media] Hives    Antimicrobials this admission: cefepime 5/29 >>  vancomycin 5/29 >>  Metronidazole x1 5/29  Dose adjustments this admission:  Microbiology results: 5/29 BCx:  5/29 UCx:   5/29 MRSA PCR:    Cindi Carbon, PharmD, BCPS 04/22/2023 8:14 AM

## 2023-04-22 NOTE — Progress Notes (Signed)
PHARMACY - PHYSICIAN COMMUNICATION CRITICAL VALUE ALERT - BLOOD CULTURE IDENTIFICATION (BCID)  [name] is an 72 y.o. female who presented to [facility] on 04/21/2023 admitted with septic shock.   Assessment: Pt currently on broad spectrum antibiotics for septic shock.  BCID + 3/4 streptococcus agalactiae  Name of physician (or Provider) Contacted: Tessie Fass, NP  Current antibiotics: Cefepime, vancomycin  Changes to prescribed antibiotics recommended: Narrow to cefazolin.  CrCl 38 mL/min. Initiate cefazolin 2 g IV q8h  Results for orders placed or performed during the hospital encounter of 04/21/23  Blood Culture ID Panel (Reflexed) (Collected: 04/21/2023  5:53 PM)  Result Value Ref Range   Enterococcus faecalis NOT DETECTED NOT DETECTED   Enterococcus Faecium NOT DETECTED NOT DETECTED   Listeria monocytogenes NOT DETECTED NOT DETECTED   Staphylococcus species NOT DETECTED NOT DETECTED   Staphylococcus aureus (BCID) NOT DETECTED NOT DETECTED   Staphylococcus epidermidis NOT DETECTED NOT DETECTED   Staphylococcus lugdunensis NOT DETECTED NOT DETECTED   Streptococcus species DETECTED (A) NOT DETECTED   Streptococcus agalactiae DETECTED (A) NOT DETECTED   Streptococcus pneumoniae NOT DETECTED NOT DETECTED   Streptococcus pyogenes NOT DETECTED NOT DETECTED   A.calcoaceticus-baumannii NOT DETECTED NOT DETECTED   Bacteroides fragilis NOT DETECTED NOT DETECTED   Enterobacterales NOT DETECTED NOT DETECTED   Enterobacter cloacae complex NOT DETECTED NOT DETECTED   Escherichia coli NOT DETECTED NOT DETECTED   Klebsiella aerogenes NOT DETECTED NOT DETECTED   Klebsiella oxytoca NOT DETECTED NOT DETECTED   Klebsiella pneumoniae NOT DETECTED NOT DETECTED   Proteus species NOT DETECTED NOT DETECTED   Salmonella species NOT DETECTED NOT DETECTED   Serratia marcescens NOT DETECTED NOT DETECTED   Haemophilus influenzae NOT DETECTED NOT DETECTED   Neisseria meningitidis NOT DETECTED  NOT DETECTED   Pseudomonas aeruginosa NOT DETECTED NOT DETECTED   Stenotrophomonas maltophilia NOT DETECTED NOT DETECTED   Candida albicans NOT DETECTED NOT DETECTED   Candida auris NOT DETECTED NOT DETECTED   Candida glabrata NOT DETECTED NOT DETECTED   Candida krusei NOT DETECTED NOT DETECTED   Candida parapsilosis NOT DETECTED NOT DETECTED   Candida tropicalis NOT DETECTED NOT DETECTED   Cryptococcus neoformans/gattii NOT DETECTED NOT DETECTED    Cindi Carbon, PharmD 04/22/23 11:18 AM

## 2023-04-23 ENCOUNTER — Inpatient Hospital Stay (HOSPITAL_COMMUNITY): Payer: Medicare Other

## 2023-04-23 DIAGNOSIS — R6521 Severe sepsis with septic shock: Secondary | ICD-10-CM | POA: Diagnosis not present

## 2023-04-23 DIAGNOSIS — A419 Sepsis, unspecified organism: Secondary | ICD-10-CM | POA: Diagnosis not present

## 2023-04-23 LAB — CBC
HCT: 27.9 % — ABNORMAL LOW (ref 36.0–46.0)
Hemoglobin: 9.4 g/dL — ABNORMAL LOW (ref 12.0–15.0)
MCH: 35.5 pg — ABNORMAL HIGH (ref 26.0–34.0)
MCHC: 33.7 g/dL (ref 30.0–36.0)
MCV: 105.3 fL — ABNORMAL HIGH (ref 80.0–100.0)
Platelets: 80 10*3/uL — ABNORMAL LOW (ref 150–400)
RBC: 2.65 MIL/uL — ABNORMAL LOW (ref 3.87–5.11)
RDW: 17.2 % — ABNORMAL HIGH (ref 11.5–15.5)
WBC: 16.4 10*3/uL — ABNORMAL HIGH (ref 4.0–10.5)
nRBC: 0 % (ref 0.0–0.2)

## 2023-04-23 LAB — BASIC METABOLIC PANEL
Anion gap: 8 (ref 5–15)
BUN: 32 mg/dL — ABNORMAL HIGH (ref 8–23)
CO2: 20 mmol/L — ABNORMAL LOW (ref 22–32)
Calcium: 9 mg/dL (ref 8.9–10.3)
Chloride: 106 mmol/L (ref 98–111)
Creatinine, Ser: 1.14 mg/dL — ABNORMAL HIGH (ref 0.44–1.00)
GFR, Estimated: 51 mL/min — ABNORMAL LOW (ref >60)
Glucose, Bld: 104 mg/dL — ABNORMAL HIGH (ref 70–99)
Potassium: 5 mmol/L (ref 3.5–5.1)
Sodium: 134 mmol/L — ABNORMAL LOW (ref 135–145)

## 2023-04-23 LAB — CULTURE, BLOOD (ROUTINE X 2): Special Requests: ADEQUATE

## 2023-04-23 LAB — PHOSPHORUS: Phosphorus: 4.2 mg/dL (ref 2.5–4.6)

## 2023-04-23 LAB — AMMONIA: Ammonia: 39 umol/L — ABNORMAL HIGH (ref 9–35)

## 2023-04-23 LAB — MAGNESIUM: Magnesium: 2.4 mg/dL (ref 1.7–2.4)

## 2023-04-23 MED ORDER — LACTULOSE 10 GM/15ML PO SOLN
30.0000 g | Freq: Three times a day (TID) | ORAL | Status: DC
  Administered 2023-04-23 – 2023-04-27 (×11): 30 g via ORAL
  Filled 2023-04-23: qty 45
  Filled 2023-04-23 (×4): qty 60
  Filled 2023-04-23: qty 45
  Filled 2023-04-23 (×3): qty 60
  Filled 2023-04-23 (×2): qty 45

## 2023-04-23 MED ORDER — RISAQUAD PO CAPS
1.0000 | ORAL_CAPSULE | Freq: Every day | ORAL | Status: DC
  Administered 2023-04-23 – 2023-04-27 (×5): 1 via ORAL
  Filled 2023-04-23 (×5): qty 1

## 2023-04-23 MED ORDER — HYDROCORTISONE SOD SUC (PF) 100 MG IJ SOLR
100.0000 mg | Freq: Two times a day (BID) | INTRAMUSCULAR | Status: DC
  Administered 2023-04-23 – 2023-04-24 (×2): 100 mg via INTRAVENOUS
  Filled 2023-04-23 (×2): qty 2

## 2023-04-23 MED ORDER — LEVOTHYROXINE SODIUM 50 MCG PO TABS
75.0000 ug | ORAL_TABLET | Freq: Every day | ORAL | Status: DC
  Administered 2023-04-24 – 2023-04-27 (×4): 75 ug via ORAL
  Filled 2023-04-23 (×4): qty 1

## 2023-04-23 MED ORDER — PNEUMOCOCCAL 20-VAL CONJ VACC 0.5 ML IM SUSY
0.5000 mL | PREFILLED_SYRINGE | INTRAMUSCULAR | Status: AC
  Administered 2023-04-25: 0.5 mL via INTRAMUSCULAR
  Filled 2023-04-23: qty 0.5

## 2023-04-23 NOTE — Progress Notes (Signed)
NAME:  [name], MRN:  [id number], DOB:  07/04/1951, LOS: 2 ADMISSION DATE:  04/21/2023, CONSULTATION DATE:  04/21/23 REFERRING MD:  Chaney Malling, MD  CHIEF COMPLAINT:  Septic shock   History of Present Illness:  [name] is a 72 year old with the below medical history who presents with altered mental status. She participated in physical therapy yesterday and had right leg redness and heaviness. She presented to PCP for concern for insect bite and found with decreased and altered mental status. Advised to present to ED for evaluation. Started on levophed for hypotension. LA 4.3 Started on Cefepime and Vanc. DVT negative. CXR no infiltrate with no significant effusion. PCCM consulted for admission  Recently hospitalized in April 2024 with acute hepatic encephalopathy in setting with hyperammonemia. Husband reports her mental status is at baseline right now.  Pertinent  Medical History  Liver cirrhosis with hx HE, portal HTN and gastric varices s/p TIPS in 2012, adrenal insufficiency, hypothyroidism, HTN, hx colon cancer s/p partial colectomy and chemotherapy  Significant Hospital Events: Including procedures, antibiotic start and stop dates in addition to other pertinent events   5/29 admit Blood cultures 5/29 > group B strep Urine culture 5/29 > group B strep Renal ultrasound 5/30 > no calculi, no obstruction Echocardiogram 5/30 > LVEF 55%, mild RV enlargement with normal systolic function.  Moderately elevated PASP 53 mmHg.  Dilated left atrium.  Degeneration and calcification mitral valve  Interim History / Subjective:  Antibiotics narrowed to cefazolin after blood cultures speciated group B strep Nasal cannula 2 L/min Norepinephrine weaned to off Remains on hydrocortisone   Objective   Blood pressure 120/65, pulse 70, temperature (!) 97.2 F (36.2 C), resp. rate 17, height 5\' 4"  (1.626 m), weight 78.5 kg, SpO2 95 %.        Intake/Output Summary (Last 24 hours) at  04/23/2023 1001 Last data filed at 04/23/2023 0800 Gross per 24 hour  Intake 2498.73 ml  Output 2950 ml  Net -451.27 ml   Filed Weights   04/21/23 2311 04/22/23 0320 04/23/23 0500  Weight: 76.1 kg 76.1 kg 78.5 kg    Physical Exam: Chronically ill but no acute distress Laying in bed, comfortable Wakes easily, answers questions appropriately, follows commands Oropharynx clear.  No stridor Heart distant, regular Lungs decreased to both bases but otherwise clear. No evidence of cellulitis on feet.  There is some right mid thigh erythema, not clearly cellulitic.  No warmth.   Resolved Hospital Problem list   N/A  Assessment & Plan:  Septic shock-with group B strep bacteremia.  Possible source right lower extremity cellulitis, but likely due to group B strep urinary tract infection.  Pressors weaned to off AKI-improved with volume resuscitation, hemodynamic stabilization.  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  She is on 5 mg 3 times daily at home -Follow urine output and BMP, avoid nephrotoxins -Should be okay to restart home lactulose 5/31 -Follow telemetry -Restart her home Synthroid   Best Practice (right click and "Reselect all SmartList Selections" daily)   Diet/type: cardiac DVT prophylaxis: heparin GI prophylaxis: N/A Lines: N/A Foley:  N/A Code Status:  full code Last date of multidisciplinary goals of care: Discussed with husband and patient 5/31   Levy Pupa, MD, PhD 04/23/2023,  10:10 AM [facility] [phone] or if no answer before 7:00PM call [phone] For any issues after 7:00PM please call eLink [phone]

## 2023-04-23 NOTE — Plan of Care (Signed)
Plan of care reviewed with patient and family,time given for Q&A.   Problem: Education: Goal: Knowledge of General Education information will improve Description: Including pain rating scale, medication(s)/side effects and non-pharmacologic comfort measures Outcome: Progressing   Problem: Health Behavior/Discharge Planning: Goal: Ability to manage health-related needs will improve Outcome: Progressing   Problem: Clinical Measurements: Goal: Ability to maintain clinical measurements within normal limits will improve Outcome: Progressing Goal: Will remain free from infection Outcome: Progressing Goal: Diagnostic test results will improve Outcome: Progressing Goal: Respiratory complications will improve Outcome: Progressing Goal: Cardiovascular complication will be avoided Outcome: Progressing   Problem: Activity: Goal: Risk for activity intolerance will decrease Outcome: Progressing   Problem: Nutrition: Goal: Adequate nutrition will be maintained Outcome: Progressing   Problem: Coping: Goal: Level of anxiety will decrease Outcome: Progressing   Problem: Elimination: Goal: Will not experience complications related to bowel motility Outcome: Progressing Goal: Will not experience complications related to urinary retention Outcome: Progressing   Problem: Pain Managment: Goal: General experience of comfort will improve Outcome: Progressing   Problem: Safety: Goal: Ability to remain free from injury will improve Outcome: Progressing   Problem: Skin Integrity: Goal: Risk for impaired skin integrity will decrease Outcome: Progressing

## 2023-04-24 DIAGNOSIS — K7030 Alcoholic cirrhosis of liver without ascites: Secondary | ICD-10-CM

## 2023-04-24 DIAGNOSIS — N179 Acute kidney failure, unspecified: Secondary | ICD-10-CM

## 2023-04-24 DIAGNOSIS — K7682 Hepatic encephalopathy: Secondary | ICD-10-CM | POA: Diagnosis not present

## 2023-04-24 DIAGNOSIS — E2740 Unspecified adrenocortical insufficiency: Secondary | ICD-10-CM

## 2023-04-24 DIAGNOSIS — N178 Other acute kidney failure: Secondary | ICD-10-CM

## 2023-04-24 DIAGNOSIS — E038 Other specified hypothyroidism: Secondary | ICD-10-CM

## 2023-04-24 DIAGNOSIS — A401 Sepsis due to streptococcus, group B: Secondary | ICD-10-CM | POA: Diagnosis not present

## 2023-04-24 DIAGNOSIS — R6521 Severe sepsis with septic shock: Secondary | ICD-10-CM | POA: Diagnosis present

## 2023-04-24 LAB — CULTURE, BLOOD (ROUTINE X 2)

## 2023-04-24 LAB — CBC
HCT: 28.6 % — ABNORMAL LOW (ref 36.0–46.0)
Hemoglobin: 9.3 g/dL — ABNORMAL LOW (ref 12.0–15.0)
MCH: 34.3 pg — ABNORMAL HIGH (ref 26.0–34.0)
MCHC: 32.5 g/dL (ref 30.0–36.0)
MCV: 105.5 fL — ABNORMAL HIGH (ref 80.0–100.0)
Platelets: 87 10*3/uL — ABNORMAL LOW (ref 150–400)
RBC: 2.71 MIL/uL — ABNORMAL LOW (ref 3.87–5.11)
RDW: 17.1 % — ABNORMAL HIGH (ref 11.5–15.5)
WBC: 16.6 10*3/uL — ABNORMAL HIGH (ref 4.0–10.5)
nRBC: 0 % (ref 0.0–0.2)

## 2023-04-24 LAB — BASIC METABOLIC PANEL
Anion gap: 9 (ref 5–15)
BUN: 38 mg/dL — ABNORMAL HIGH (ref 8–23)
CO2: 19 mmol/L — ABNORMAL LOW (ref 22–32)
Calcium: 9.2 mg/dL (ref 8.9–10.3)
Chloride: 108 mmol/L (ref 98–111)
Creatinine, Ser: 1.06 mg/dL — ABNORMAL HIGH (ref 0.44–1.00)
GFR, Estimated: 56 mL/min — ABNORMAL LOW (ref >60)
Glucose, Bld: 92 mg/dL (ref 70–99)
Potassium: 4.8 mmol/L (ref 3.5–5.1)
Sodium: 136 mmol/L (ref 135–145)

## 2023-04-24 LAB — AMMONIA: Ammonia: 41 umol/L — ABNORMAL HIGH (ref 9–35)

## 2023-04-24 LAB — MAGNESIUM: Magnesium: 2.2 mg/dL (ref 1.7–2.4)

## 2023-04-24 LAB — PHOSPHORUS: Phosphorus: 3.5 mg/dL (ref 2.5–4.6)

## 2023-04-24 MED ORDER — DIPHENHYDRAMINE HCL 50 MG/ML IJ SOLN: 50.0000 mg | Freq: Once | INTRAMUSCULAR | Status: AC

## 2023-04-24 MED ORDER — HYDROCORTISONE SOD SUC (PF) 100 MG IJ SOLR
100.0000 mg | Freq: Once | INTRAMUSCULAR | Status: AC
  Administered 2023-04-24: 100 mg via INTRAVENOUS
  Filled 2023-04-24: qty 2

## 2023-04-24 MED ORDER — DIPHENHYDRAMINE HCL 25 MG PO CAPS
50.0000 mg | ORAL_CAPSULE | Freq: Once | ORAL | Status: AC
  Administered 2023-04-25: 50 mg via ORAL

## 2023-04-24 MED ORDER — DIPHENHYDRAMINE HCL 25 MG PO CAPS
50.0000 mg | ORAL_CAPSULE | Freq: Once | ORAL | Status: DC
  Filled 2023-04-24: qty 2

## 2023-04-24 MED ORDER — HYDROCORTISONE SOD SUC (PF) 250 MG IJ SOLR: 200.0000 mg | INTRAMUSCULAR | Status: DC

## 2023-04-24 MED ORDER — DIPHENHYDRAMINE HCL 50 MG/ML IJ SOLN: 50.0000 mg | Freq: Once | INTRAMUSCULAR | Status: DC

## 2023-04-24 NOTE — Assessment & Plan Note (Signed)
Supportive care Continue lactulose

## 2023-04-24 NOTE — Assessment & Plan Note (Signed)
Improving according to family with treatment of underlying infection and reduction of high-dose steroids. Continuing melatonin nightly to regulate sleep cycle. Ammonia currently 41 which is much better than what patient ammonia level is when she has had flareups of her hepatic encephalopathy in the past and therefore I do not believe that this is contributing at this time.  Will obtain repeat ammonia level in the morning prior to discharge. No evidence of focal neurologic deficit Vitamin B12 and folate unremarkable. TSH 12/2022 unremarkable

## 2023-04-24 NOTE — Progress Notes (Signed)
PROGRESS NOTE   [name]  WUJ:[id number] DOB: [birth year] DOA: 04/21/2023 PCP: Eden Emms, NP   Date of Service: the patient was seen and examined on 04/24/2023  Brief Narrative:  72 year old female with past medical history of colon cancer status post partial colectomy and chemotherapy, liver cirrhosis secondary to oxaliplatin chemotherapy status post TIPS procedure in 2012, hepatic encephalopathy, adrenal insufficiency (diagnosed during 02/2023 hospitalization), hypothyroidism, SVT who presented to [facility] emergency department on 5/29 with confusion.  Upon evaluation in the emergency department patient found to be septic with septic shock requiring initiation of Levophed for vasopressor support.  Patient was placed on aggressive intravenous volume resuscitation and broad-spectrum intravenous antibiotics.  PCCM was contacted and the patient was admitted to the intensive care unit.  Concerning the patient's sepsis, the source was felt to be secondary to a right thigh cellulitis.  Patient was concurrently found to have group B strep bacteremia with group B strep in the urine as well.  Antibiotic regimen was de-escalated to Ancef monotherapy.  In the days that followed patient clinically improved and patient was weaned off of vasopressors on 5/30.  Patient was transferred to the hospitalist service on 6/1.   Assessment and Plan: Sepsis due to group B Streptococcus with acute renal failure and septic shock (HCC) Patient has been weaned off of Levophed on 5/30 Continuing intravenous Ancef for treatment of group B strep bacteremia with suspected contributing urinary tract infection and right thigh cellulitis Obtaining imaging of the abdomen pelvis to evaluate for any focus of infection such as abscess Continue close clinical monitoring, transferring patient to progressive unit from intensive care unit   AKI (acute kidney injury) (HCC) Renal function improving with resolving  infection and intravenous volume resuscitation Strict input and output monitoring Avoid nephrotoxic agents Monitoring renal function and electrolytes with serial chemistries  Acute hepatic encephalopathy (HCC) Family reports patient continues to be confused despite evidence infection is resolving clinically Ammonia currently 70 which is much better than what patient ammonia level is when she has had flareups of her hepatic encephalopathy in the past and therefore I do not believe that this is contributing at this time. Encephalopathy likely secondary to underlying infection No evidence of focal neurologic deficit Obtaining vitamin B12, folate TSH 12/2022 unremarkable  Adrenal insufficiency (HCC) Recent diagnosis with adrenal insufficiency during 02/2023 hospitalization Patient currently receiving stress dose steroids with Solu-Cortef Will wean back down to usual maintenance regimen Plan is for patient to follow-up as an outpatient with endocrinology to confirm diagnosis after discharge  Decompensated liver cirrhosis with portal HTN and gastric varices Supportive care Continue lactulose  Hypothyroidism Resume home regimen of Synthroid     Subjective:  Patient is unable to answer questions appropriately due to confusion.  Patient does complain of abdominal pain but is unable to provide further detail.  Physical Exam:  Vitals:   04/24/23 1300 04/24/23 1400 04/24/23 1501 04/24/23 1841  BP:  122/66 119/70 127/66  Pulse: (!) 57 (!) 56 (!) 55 (!) 50  Resp: (!) 22 15 17 16   Temp: 98.1 F (36.7 C)  97.7 F (36.5 C) 97.8 F (36.6 C)  TempSrc:   Oral Oral  SpO2: 94% 95% 94% 94%  Weight:   77.7 kg   Height:   5\' 4"  (1.626 m)     Constitutional: Lethargic alert.  Patient is currently not in distress. Skin: Area of erythema and induration of the right medial thigh seems to be improved compared to prior examinations.  Poor skin turgor noted. Eyes: Pupils are equally reactive to  light.  No evidence of scleral icterus or conjunctival pallor.  ENMT: Moist mucous membranes noted.  Posterior pharynx clear of any exudate or lesions.   Respiratory: clear to auscultation bilaterally, no wheezing, no crackles. Normal respiratory effort. No accessory muscle use.  Cardiovascular: Regular rate and rhythm, no murmurs / rubs / gallops. No extremity edema. 2+ pedal pulses. No carotid bruits.  Abdomen: Diffuse abdominal tenderness,.  Abdomen is soft.  No evidence of intra-abdominal masses.  Positive bowel sounds noted in all quadrants.   Musculoskeletal: No joint deformity upper and lower extremities. Good ROM, no contractures. Normal muscle tone.    Data Reviewed:  I have personally reviewed and interpreted labs, imaging.  Significant findings are   CBC: Recent Labs  Lab 04/21/23 1750 04/22/23 0315 04/23/23 0321 04/24/23 0247  WBC 10.3 22.7* 16.4* 16.6*  NEUTROABS 9.7*  --   --   --   HGB 9.9* 10.5* 9.4* 9.3*  HCT 30.1* 31.6* 27.9* 28.6*  MCV 106.4* 105.7* 105.3* 105.5*  PLT 104* 143* 80* 87*   Basic Metabolic Panel: Recent Labs  Lab 04/21/23 1750 04/22/23 0315 04/23/23 0321 04/24/23 0247  NA 133* 131* 134* 136  K 4.6 4.7 5.0 4.8  CL 104 104 106 108  CO2 20* 18* 20* 19*  GLUCOSE 100* 103* 104* 92  BUN 28* 29* 32* 38*  CREATININE 1.35* 1.33* 1.14* 1.06*  CALCIUM 9.4 8.5* 9.0 9.2  MG  --  1.2* 2.4 2.2  PHOS  --  4.0 4.2 3.5   GFR: Estimated Creatinine Clearance: 49.1 mL/min (A) (by C-G formula based on SCr of 1.06 mg/dL (H)). Liver Function Tests: Recent Labs  Lab 04/21/23 1750 04/22/23 0315  AST 57* 56*  ALT 43 41  ALKPHOS 153* 106  BILITOT 2.0* 3.0*  PROT 6.0* 6.4*  ALBUMIN 2.7* 3.0*    Coagulation Profile: Recent Labs  Lab 04/21/23 1750  INR 1.7*     EKG/Telemetry: Personally reviewed.  Rhythm is normal sinus rhythm with heart rate of 100 bpm.  No dynamic ST segment changes appreciated.   Code Status:  Full code.  Code status  decision has been confirmed with: Husband at the bedside Family Communication: Plan of care has been discussed with husband and daughter at the bedside.   Severity of Illness:  The appropriate patient status for this patient is INPATIENT. Inpatient status is judged to be reasonable and necessary in order to provide the required intensity of service to ensure the patient's safety. The patient's presenting symptoms, physical exam findings, and initial radiographic and laboratory data in the context of their chronic comorbidities is felt to place them at high risk for further clinical deterioration. Furthermore, it is not anticipated that the patient will be medically stable for discharge from the hospital within 2 midnights of admission.   * I certify that at the point of admission it is my clinical judgment that the patient will require inpatient hospital care spanning beyond 2 midnights from the point of admission due to high intensity of service, high risk for further deterioration and high frequency of surveillance required.*  Time spent:  59 minutes  Author:  Marinda Elk MD  04/24/2023 10:49 PM

## 2023-04-24 NOTE — Hospital Course (Signed)
72 year old female with past medical history of colon cancer status post partial colectomy and chemotherapy, liver cirrhosis secondary to oxaliplatin chemotherapy status post TIPS procedure in 2012, hepatic encephalopathy, adrenal insufficiency (diagnosed during 02/2023 hospitalization), hypothyroidism, SVT who presented to [facility] emergency department on 5/29 with confusion.  Upon evaluation in the emergency department patient found to be septic with septic shock requiring initiation of Levophed for vasopressor support.  Patient was placed on aggressive intravenous volume resuscitation and broad-spectrum intravenous antibiotics.  PCCM was contacted and the patient was admitted to the intensive care unit.  Concerning the patient's sepsis, the source was felt to be secondary to a right thigh cellulitis.  Patient was concurrently found to have group B strep bacteremia with group B strep in the urine as well.  Antibiotic regimen was de-escalated to Ancef monotherapy.  In the days that followed patient clinically improved and patient was weaned off of vasopressors on 5/30.  Patient was transferred to the hospitalist service on 6/1.

## 2023-04-24 NOTE — Assessment & Plan Note (Signed)
Recent diagnosis with adrenal insufficiency during 02/2023 hospitalization Reducing hydrocortisone dosing.  Will give 25 mg this evening followed by 10 mg 3 times daily tomorrow followed by 5 mg 3 times daily. Plan is for patient to follow-up as an outpatient with endocrinology to confirm diagnosis after discharge

## 2023-04-24 NOTE — Assessment & Plan Note (Addendum)
Patient has been weaned off of Levophed since 5/30 Transition to oral Augmentin for treatment of multifocal infection with bibasilar pneumonia, urinary tract infection, and group B strep bacteremia  Leukocytosis resolved

## 2023-04-24 NOTE — Plan of Care (Signed)

## 2023-04-24 NOTE — Assessment & Plan Note (Signed)
.   Resume home regimen of Synthroid 

## 2023-04-24 NOTE — Assessment & Plan Note (Signed)
Resolved with intravenous volume resuscitation and treatment of underlying infection. Strict input and output monitoring Avoid nephrotoxic agents Monitoring renal function and electrolytes with serial chemistries

## 2023-04-24 NOTE — Progress Notes (Signed)
RN attempted to bladder scan patient within 15 minutes after voiding from post Foley Catheter removal but there was difficulty with the bladder scan. Patient had told RN that she had urinated an adequate amount. When bladder scan finally worked, patient had 106 mL in bladder, but it had been 30 minutes since patient urinated. Will continue to monitor urinary output.

## 2023-04-25 ENCOUNTER — Inpatient Hospital Stay (HOSPITAL_COMMUNITY): Payer: Medicare Other

## 2023-04-25 DIAGNOSIS — E039 Hypothyroidism, unspecified: Secondary | ICD-10-CM

## 2023-04-25 DIAGNOSIS — K7682 Hepatic encephalopathy: Secondary | ICD-10-CM | POA: Diagnosis not present

## 2023-04-25 DIAGNOSIS — E2740 Unspecified adrenocortical insufficiency: Secondary | ICD-10-CM | POA: Diagnosis not present

## 2023-04-25 DIAGNOSIS — A401 Sepsis due to streptococcus, group B: Secondary | ICD-10-CM | POA: Diagnosis not present

## 2023-04-25 DIAGNOSIS — I495 Sick sinus syndrome: Secondary | ICD-10-CM

## 2023-04-25 DIAGNOSIS — N179 Acute kidney failure, unspecified: Secondary | ICD-10-CM | POA: Diagnosis not present

## 2023-04-25 DIAGNOSIS — K7460 Unspecified cirrhosis of liver: Secondary | ICD-10-CM

## 2023-04-25 LAB — CBC WITH DIFFERENTIAL/PLATELET
Abs Immature Granulocytes: 0.07 10*3/uL (ref 0.00–0.07)
Basophils Absolute: 0 10*3/uL (ref 0.0–0.1)
Basophils Relative: 0 %
Eosinophils Absolute: 0 10*3/uL (ref 0.0–0.5)
Eosinophils Relative: 0 %
HCT: 29 % — ABNORMAL LOW (ref 36.0–46.0)
Hemoglobin: 9.5 g/dL — ABNORMAL LOW (ref 12.0–15.0)
Immature Granulocytes: 1 %
Lymphocytes Relative: 6 %
Lymphs Abs: 0.7 10*3/uL (ref 0.7–4.0)
MCH: 34.3 pg — ABNORMAL HIGH (ref 26.0–34.0)
MCHC: 32.8 g/dL (ref 30.0–36.0)
MCV: 104.7 fL — ABNORMAL HIGH (ref 80.0–100.0)
Monocytes Absolute: 0.7 10*3/uL (ref 0.1–1.0)
Monocytes Relative: 6 %
Neutro Abs: 9.7 10*3/uL — ABNORMAL HIGH (ref 1.7–7.7)
Neutrophils Relative %: 87 %
Platelets: 90 10*3/uL — ABNORMAL LOW (ref 150–400)
RBC: 2.77 MIL/uL — ABNORMAL LOW (ref 3.87–5.11)
RDW: 16.8 % — ABNORMAL HIGH (ref 11.5–15.5)
WBC: 11.2 10*3/uL — ABNORMAL HIGH (ref 4.0–10.5)
nRBC: 0 % (ref 0.0–0.2)

## 2023-04-25 LAB — FOLATE: Folate: 14.5 ng/mL (ref >5.9)

## 2023-04-25 LAB — COMPREHENSIVE METABOLIC PANEL
ALT: 37 U/L (ref 0–44)
AST: 85 U/L — ABNORMAL HIGH (ref 15–41)
Albumin: 2.8 g/dL — ABNORMAL LOW (ref 3.5–5.0)
Alkaline Phosphatase: 157 U/L — ABNORMAL HIGH (ref 38–126)
Anion gap: 5 (ref 5–15)
BUN: 37 mg/dL — ABNORMAL HIGH (ref 8–23)
CO2: 21 mmol/L — ABNORMAL LOW (ref 22–32)
Calcium: 8.8 mg/dL — ABNORMAL LOW (ref 8.9–10.3)
Chloride: 109 mmol/L (ref 98–111)
Creatinine, Ser: 0.94 mg/dL (ref 0.44–1.00)
GFR, Estimated: >60 mL/min (ref >60)
Glucose, Bld: 111 mg/dL — ABNORMAL HIGH (ref 70–99)
Potassium: 4.2 mmol/L (ref 3.5–5.1)
Sodium: 135 mmol/L (ref 135–145)
Total Bilirubin: 0.9 mg/dL (ref 0.3–1.2)
Total Protein: 6.3 g/dL — ABNORMAL LOW (ref 6.5–8.1)

## 2023-04-25 LAB — MAGNESIUM: Magnesium: 2 mg/dL (ref 1.7–2.4)

## 2023-04-25 LAB — VITAMIN B12: Vitamin B-12: 1396 pg/mL — ABNORMAL HIGH (ref 180–914)

## 2023-04-25 LAB — PHOSPHORUS: Phosphorus: 2.8 mg/dL (ref 2.5–4.6)

## 2023-04-25 MED ORDER — ACETAMINOPHEN 325 MG PO TABS: 650.0000 mg | ORAL_TABLET | Freq: Four times a day (QID) | ORAL | Status: DC | PRN

## 2023-04-25 MED ORDER — IOHEXOL 300 MG/ML  SOLN
100.0000 mL | Freq: Once | INTRAMUSCULAR | Status: AC | PRN
  Administered 2023-04-25: 100 mL via INTRAVENOUS

## 2023-04-25 MED ORDER — HYDROCORTISONE 5 MG PO TABS
25.0000 mg | ORAL_TABLET | Freq: Once | ORAL | Status: AC
  Administered 2023-04-25: 25 mg via ORAL
  Filled 2023-04-25: qty 1

## 2023-04-25 MED ORDER — HYDROCORTISONE 5 MG PO TABS
5.0000 mg | ORAL_TABLET | Freq: Three times a day (TID) | ORAL | Status: DC
  Administered 2023-04-27: 5 mg via ORAL
  Filled 2023-04-25 (×2): qty 1

## 2023-04-25 MED ORDER — HYDROCORTISONE 10 MG PO TABS
10.0000 mg | ORAL_TABLET | Freq: Three times a day (TID) | ORAL | Status: AC
  Administered 2023-04-26 (×3): 10 mg via ORAL
  Filled 2023-04-25 (×3): qty 1

## 2023-04-25 MED ORDER — MELATONIN 5 MG PO TABS
10.0000 mg | ORAL_TABLET | Freq: Every day | ORAL | Status: DC
  Administered 2023-04-25 – 2023-04-26 (×2): 10 mg via ORAL
  Filled 2023-04-25 (×2): qty 2

## 2023-04-25 MED ORDER — HYDROCORTISONE 10 MG PO TABS: 10.0000 mg | ORAL_TABLET | Freq: Three times a day (TID) | ORAL | Status: DC

## 2023-04-25 MED ORDER — MELATONIN 5 MG PO TABS: 10.0000 mg | ORAL_TABLET | Freq: Every evening | ORAL | Status: DC | PRN

## 2023-04-25 NOTE — Evaluation (Signed)
Physical Therapy Evaluation Patient Details Name: [name] MRN: [id number] DOB: [birth year] Today's Date: 04/25/2023  History of Present Illness  72 yo female admitted wth sepsis, AKI. Hx of colon ca, liver cirrhosis, hepatic encephalopathy, fall, L wrist fx 01/2023, SVT, adrenal insufficiency  Clinical Impression  On eval, pt was Close Supv level for mobility. She walked ~185 feet with a RW. She participated well. Family was present during session. Will plan to follow pt during hospital stay. Plan is for pt to resume outpatient PT services once able.        Recommendations for follow up therapy are one component of a multi-disciplinary discharge planning process, led by the attending physician.  Recommendations may be updated based on patient status, additional functional criteria and insurance authorization.  Follow Up Recommendations       Assistance Recommended at Discharge Frequent or constant Supervision/Assistance  Patient can return home with the following  Assistance with cooking/housework;Assist for transportation;Help with stairs or ramp for entrance;Direct supervision/assist for financial management;A little help with walking and/or transfers;A little help with bathing/dressing/bathroom    Equipment Recommendations None recommended by PT  Recommendations for Other Services       Functional Status Assessment Patient has had a recent decline in their functional status and demonstrates the ability to make significant improvements in function in a reasonable and predictable amount of time.     Precautions / Restrictions Precautions Precautions: Fall Restrictions Weight Bearing Restrictions: No      Mobility  Bed Mobility               General bed mobility comments: oob in recliner    Transfers Overall transfer level: Needs assistance Equipment used: Rolling walker (2 wheels) Transfers: Sit to/from Stand Sit to Stand: Supervision                 Ambulation/Gait Ambulation/Gait assistance: Supervision Gait Distance (Feet): 185 Feet Assistive device: Rolling walker (2 wheels) Gait Pattern/deviations: Step-through pattern, Decreased stride length       General Gait Details: close supv. no overt LOB with RW use. Pt denied dizziness.  Stairs            Wheelchair Mobility    Modified Rankin (Stroke Patients Only)       Balance Overall balance assessment: History of Falls, Needs assistance         Standing balance support: Bilateral upper extremity supported, During functional activity, Reliant on assistive device for balance Standing balance-Leahy Scale: Fair                               Pertinent Vitals/Pain Pain Assessment Pain Assessment: No/denies pain    Home Living Family/patient expects to be discharged to:: Private residence Living Arrangements: Spouse/significant other Available Help at Discharge: Family;Available PRN/intermittently Type of Home: House Home Access: Ramped entrance       Home Layout: One level Home Equipment: Rolling Walker (2 wheels);BSC/3in1;Cane - single point;Wheelchair - manual;Hospital bed      Prior Function Prior Level of Function : Independent/Modified Independent             Mobility Comments: Librarian at a school, 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 ADLs Comments: independent     Hand Dominance   Dominant Hand: Right    Extremity/Trunk Assessment   Upper Extremity Assessment Upper Extremity Assessment:  (L wrist in soft splint)    Lower Extremity Assessment Lower Extremity Assessment:  (  LE edema)    Cervical / Trunk Assessment Cervical / Trunk Assessment: Normal  Communication   Communication: No difficulties  Cognition Arousal/Alertness: Awake/alert Behavior During Therapy: WFL for tasks assessed/performed                                   General Comments: still with some confusion        General Comments       Exercises     Assessment/Plan    PT Assessment Patient needs continued PT services  PT Problem List Decreased activity tolerance;Decreased balance;Decreased mobility;Decreased knowledge of use of DME;Decreased cognition       PT Treatment Interventions DME instruction;Gait training;Therapeutic exercise;Balance training;Functional mobility training;Therapeutic activities;Patient/family education    PT Goals (Current goals can be found in the Care Plan section)  Acute Rehab PT Goals Patient Stated Goal: home soon PT Goal Formulation: With patient/family Time For Goal Achievement: 05/09/23 Potential to Achieve Goals: Good    Frequency Min 3X/week     Co-evaluation               AM-PAC PT "6 Clicks" Mobility  Outcome Measure Help needed turning from your back to your side while in a flat bed without using bedrails?: A Little Help needed moving from lying on your back to sitting on the side of a flat bed without using bedrails?: A Little Help needed moving to and from a bed to a chair (including a wheelchair)?: A Little Help needed standing up from a chair using your arms (e.g., wheelchair or bedside chair)?: A Little Help needed to walk in hospital room?: A Little Help needed climbing 3-5 steps with a railing? : A Little 6 Click Score: 18    End of Session Equipment Utilized During Treatment: Gait belt Activity Tolerance: Patient tolerated treatment well Patient left: in chair;with call bell/phone within reach;with family/visitor present;with chair alarm set   PT Visit Diagnosis: History of falling (Z91.81);Unsteadiness on feet (R26.81);Muscle weakness (generalized) (M62.81)    Time: 1610-9604 PT Time Calculation (min) (ACUTE ONLY): 13 min   Charges:   PT Evaluation $PT Eval Low Complexity: 1 Low           Faye Ramsay, PT Acute Rehabilitation  Office: [phone]

## 2023-04-25 NOTE — Plan of Care (Signed)
  Problem: Clinical Measurements: Goal: Diagnostic test results will improve Outcome: Progressing Goal: Respiratory complications will improve Outcome: Progressing Goal: Cardiovascular complication will be avoided Outcome: Progressing   Problem: Activity: Goal: Risk for activity intolerance will decrease Outcome: Progressing   Problem: Coping: Goal: Level of anxiety will decrease Outcome: Progressing   Problem: Elimination: Goal: Will not experience complications related to bowel motility Outcome: Progressing Goal: Will not experience complications related to urinary retention Outcome: Progressing   Problem: Pain Managment: Goal: General experience of comfort will improve Outcome: Progressing

## 2023-04-25 NOTE — Progress Notes (Signed)
SATURATION QUALIFICATIONS: (This note is used to comply with regulatory documentation for home oxygen)  Patient Saturations on Room Air at Rest = 94%  Patient Saturations on Room Air while Ambulating = 92%  Patient Saturations on 0 Liters of oxygen while Ambulating = 92%  Please briefly explain why patient needs home oxygen:Patient doesn't has oxygen currently.

## 2023-04-25 NOTE — Progress Notes (Signed)
PROGRESS NOTE   [name]  WUJ:[id number] DOB: Oct 23, 1951 DOA: 04/21/2023 PCP: Eden Emms, NP   Date of Service: the patient was seen and examined on 04/25/2023  Brief Narrative:  72 year old female with past medical history of colon cancer status post partial colectomy and chemotherapy, liver cirrhosis secondary to oxaliplatin chemotherapy status post TIPS procedure in 2012, hepatic encephalopathy, adrenal insufficiency (diagnosed during 02/2023 hospitalization), hypothyroidism, SVT who presented to [facility] emergency department on 5/29 with confusion.  Upon evaluation in the emergency department patient found to be septic with septic shock requiring initiation of Levophed for vasopressor support.  Patient was placed on aggressive intravenous volume resuscitation and broad-spectrum intravenous antibiotics.  PCCM was contacted and the patient was admitted to the intensive care unit.  Concerning the patient's sepsis, the source was felt to be secondary to a right thigh cellulitis.  Patient was concurrently found to have group B strep bacteremia with group B strep in the urine as well.  Antibiotic regimen was de-escalated to Ancef monotherapy.  In the days that followed patient clinically improved and patient was weaned off of vasopressors on 5/30.  Patient was transferred to the hospitalist service on 6/1.   Assessment and Plan: Sepsis due to group B Streptococcus with acute renal failure and septic shock (HCC) Patient has been weaned off of Levophed since 5/30 Continuing intravenous Ancef for treatment of multifocal infection with bibasilar pneumonia, urinary tract infection, and group B strep bacteremia  Leukocytosis improving  AKI (acute kidney injury) (HCC) Renal function continue to improve with resolving infection and intravenous volume resuscitation Strict input and output monitoring Avoid nephrotoxic agents Monitoring renal function and electrolytes with serial  chemistries  Acute hepatic encephalopathy (HCC) Family reports continued waxing and waning confusion.   Confusion likely secondary to ongoing infection and high-dose steroids in the setting of prolonged hospitalization and hepatic encephalopathy Adding melatonin nightly to regulate sleep cycle. Ammonia currently 41 which is much better than what patient ammonia level is when she has had flareups of her hepatic encephalopathy in the past and therefore I do not believe that this is contributing at this time. No evidence of focal neurologic deficit Vitamin B12 and folate unremarkable. TSH 12/2022 unremarkable  Adrenal insufficiency (HCC) Recent diagnosis with adrenal insufficiency during 02/2023 hospitalization Reducing hydrocortisone dosing.  Will give 25 mg this evening followed by 10 mg 3 times daily tomorrow followed by 5 mg 3 times daily. Plan is for patient to follow-up as an outpatient with endocrinology to confirm diagnosis after discharge  Decompensated liver cirrhosis with portal HTN and gastric varices Supportive care Continue lactulose to maintain at least 3 loose stools daily.   Hypothyroidism Resume home regimen of Synthroid   Tachycardia-bradycardia syndrome (HCC) Per review of older records patient has been diagnosed with tach bradycardia syndrome in the past, managed conservatively Today, patient is exhibiting bradycardia with heart rates in the 40s with no symptoms. Patient is normotensive Conservative management   Subjective:  Patient states her abdominal pain is improving.  Husband states that the patient is still exhibiting waxing and waning confusion and pulled her IV out twice yesterday evening.  Physical Exam:  Vitals:   04/25/23 0628 04/25/23 1248 04/25/23 1616 04/25/23 1939  BP: (!) 152/84 124/68 125/77 111/71  Pulse: (!) 50 (!) 58 (!) 43 (!) 51  Resp: 17 16 17 17   Temp: 98.1 F (36.7 C) 97.8 F (36.6 C) 97.6 F (36.4 C) 97.6 F (36.4 C)  TempSrc:  Oral Oral  Oral Oral  SpO2: 94% 92% 96% 97%  Weight:      Height:        Constitutional: Lethargic but arousable and more alert than yesterday.  Patient is currently not in distress. Skin: Area of erythema and induration of the right medial thigh seems to be improved compared to prior examinations.  Poor skin turgor noted. Eyes: Pupils are equally reactive to light.  No evidence of scleral icterus or conjunctival pallor.  ENMT: Moist mucous membranes noted.  Posterior pharynx clear of any exudate or lesions.   Respiratory: clear to auscultation bilaterally, no wheezing, no crackles. Normal respiratory effort. No accessory muscle use.  Cardiovascular: Regular rate and rhythm, no murmurs / rubs / gallops. No extremity edema. 2+ pedal pulses. No carotid bruits.  Abdomen: Mild epigastric tenderness.  Abdomen is soft.  No evidence of intra-abdominal masses.  Positive bowel sounds noted in all quadrants.   Musculoskeletal: No joint deformity upper and lower extremities. Good ROM, no contractures. Normal muscle tone.    Data Reviewed:  I have personally reviewed and interpreted labs, imaging.  Significant findings are   CBC: Recent Labs  Lab 04/21/23 1750 04/22/23 0315 04/23/23 0321 04/24/23 0247 04/25/23 0410  WBC 10.3 22.7* 16.4* 16.6* 11.2*  NEUTROABS 9.7*  --   --   --  9.7*  HGB 9.9* 10.5* 9.4* 9.3* 9.5*  HCT 30.1* 31.6* 27.9* 28.6* 29.0*  MCV 106.4* 105.7* 105.3* 105.5* 104.7*  PLT 104* 143* 80* 87* 90*   Basic Metabolic Panel: Recent Labs  Lab 04/21/23 1750 04/22/23 0315 04/23/23 0321 04/24/23 0247 04/25/23 0410  NA 133* 131* 134* 136 135  K 4.6 4.7 5.0 4.8 4.2  CL 104 104 106 108 109  CO2 20* 18* 20* 19* 21*  GLUCOSE 100* 103* 104* 92 111*  BUN 28* 29* 32* 38* 37*  CREATININE 1.35* 1.33* 1.14* 1.06* 0.94  CALCIUM 9.4 8.5* 9.0 9.2 8.8*  MG  --  1.2* 2.4 2.2 2.0  PHOS  --  4.0 4.2 3.5 2.8   GFR: Estimated Creatinine Clearance: 56.2 mL/min (by C-G formula based  on SCr of 0.94 mg/dL). Liver Function Tests: Recent Labs  Lab 04/21/23 1750 04/22/23 0315 04/25/23 0410  AST 57* 56* 85*  ALT 43 41 37  ALKPHOS 153* 106 157*  BILITOT 2.0* 3.0* 0.9  PROT 6.0* 6.4* 6.3*  ALBUMIN 2.7* 3.0* 2.8*    Coagulation Profile: Recent Labs  Lab 04/21/23 1750  INR 1.7*     EKG:  Personally reviewed.  Rhythm is normal sinus rhythm with heart rate of 45 bpm.  Evidence of LVH.  Code Status:  Full code.  Code status decision has been confirmed with: Husband at the bedside Family Communication: Plan of care has been discussed with husband and daughter at the bedside.   Severity of Illness:  The appropriate patient status for this patient is INPATIENT. Inpatient status is judged to be reasonable and necessary in order to provide the required intensity of service to ensure the patient's safety. The patient's presenting symptoms, physical exam findings, and initial radiographic and laboratory data in the context of their chronic comorbidities is felt to place them at high risk for further clinical deterioration. Furthermore, it is not anticipated that the patient will be medically stable for discharge from the hospital within 2 midnights of admission.   * I certify that at the point of admission it is my clinical judgment that the patient will require inpatient hospital care spanning beyond 2  midnights from the point of admission due to high intensity of service, high risk for further deterioration and high frequency of surveillance required.*  Time spent:  54 minutes  Author:  Marinda Elk MD  04/25/2023 10:24 PM

## 2023-04-25 NOTE — Assessment & Plan Note (Signed)
Per review of older records patient has been diagnosed with tach bradycardia syndrome in the past, managed conservatively Today, patient is exhibiting bradycardia with heart rates in the 40s with no symptoms. Patient is normotensive Conservative management

## 2023-04-25 NOTE — Progress Notes (Addendum)
RN got call from CCMD that patient HR dropped in low 40's.Vital signs are stable .No symptoms. MD notified.New order received.

## 2023-04-26 DIAGNOSIS — K7682 Hepatic encephalopathy: Secondary | ICD-10-CM | POA: Diagnosis not present

## 2023-04-26 DIAGNOSIS — E2740 Unspecified adrenocortical insufficiency: Secondary | ICD-10-CM | POA: Diagnosis not present

## 2023-04-26 DIAGNOSIS — A401 Sepsis due to streptococcus, group B: Secondary | ICD-10-CM | POA: Diagnosis not present

## 2023-04-26 DIAGNOSIS — N179 Acute kidney failure, unspecified: Secondary | ICD-10-CM | POA: Diagnosis not present

## 2023-04-26 LAB — CBC WITH DIFFERENTIAL/PLATELET
Abs Immature Granulocytes: 0.19 10*3/uL — ABNORMAL HIGH (ref 0.00–0.07)
Basophils Absolute: 0 10*3/uL (ref 0.0–0.1)
Basophils Relative: 0 %
Eosinophils Absolute: 0.1 10*3/uL (ref 0.0–0.5)
Eosinophils Relative: 2 %
HCT: 30.2 % — ABNORMAL LOW (ref 36.0–46.0)
Hemoglobin: 9.9 g/dL — ABNORMAL LOW (ref 12.0–15.0)
Immature Granulocytes: 3 %
Lymphocytes Relative: 14 %
Lymphs Abs: 0.9 10*3/uL (ref 0.7–4.0)
MCH: 34.7 pg — ABNORMAL HIGH (ref 26.0–34.0)
MCHC: 32.8 g/dL (ref 30.0–36.0)
MCV: 106 fL — ABNORMAL HIGH (ref 80.0–100.0)
Monocytes Absolute: 0.8 10*3/uL (ref 0.1–1.0)
Monocytes Relative: 13 %
Neutro Abs: 4.1 10*3/uL (ref 1.7–7.7)
Neutrophils Relative %: 68 %
Platelets: 109 10*3/uL — ABNORMAL LOW (ref 150–400)
RBC: 2.85 MIL/uL — ABNORMAL LOW (ref 3.87–5.11)
RDW: 16.6 % — ABNORMAL HIGH (ref 11.5–15.5)
WBC: 6 10*3/uL (ref 4.0–10.5)
nRBC: 0 % (ref 0.0–0.2)

## 2023-04-26 LAB — COMPREHENSIVE METABOLIC PANEL
ALT: 31 U/L (ref 0–44)
AST: 74 U/L — ABNORMAL HIGH (ref 15–41)
Albumin: 2.5 g/dL — ABNORMAL LOW (ref 3.5–5.0)
Alkaline Phosphatase: 183 U/L — ABNORMAL HIGH (ref 38–126)
Anion gap: 6 (ref 5–15)
BUN: 30 mg/dL — ABNORMAL HIGH (ref 8–23)
CO2: 19 mmol/L — ABNORMAL LOW (ref 22–32)
Calcium: 8.6 mg/dL — ABNORMAL LOW (ref 8.9–10.3)
Chloride: 110 mmol/L (ref 98–111)
Creatinine, Ser: 0.73 mg/dL (ref 0.44–1.00)
GFR, Estimated: >60 mL/min (ref >60)
Glucose, Bld: 94 mg/dL (ref 70–99)
Potassium: 3.9 mmol/L (ref 3.5–5.1)
Sodium: 135 mmol/L (ref 135–145)
Total Bilirubin: 0.9 mg/dL (ref 0.3–1.2)
Total Protein: 5.8 g/dL — ABNORMAL LOW (ref 6.5–8.1)

## 2023-04-26 LAB — MAGNESIUM: Magnesium: 1.7 mg/dL (ref 1.7–2.4)

## 2023-04-26 LAB — PHOSPHORUS: Phosphorus: 2.8 mg/dL (ref 2.5–4.6)

## 2023-04-26 LAB — CULTURE, BLOOD (ROUTINE X 2): Special Requests: ADEQUATE

## 2023-04-26 MED ORDER — AMOXICILLIN-POT CLAVULANATE 875-125 MG PO TABS
1.0000 | ORAL_TABLET | Freq: Two times a day (BID) | ORAL | Status: DC
  Administered 2023-04-26 – 2023-04-27 (×3): 1 via ORAL
  Filled 2023-04-26 (×3): qty 1

## 2023-04-26 MED ORDER — SPIRONOLACTONE 25 MG PO TABS
25.0000 mg | ORAL_TABLET | Freq: Every day | ORAL | Status: DC
  Administered 2023-04-26 – 2023-04-27 (×2): 25 mg via ORAL
  Filled 2023-04-26 (×2): qty 1

## 2023-04-26 MED ORDER — FUROSEMIDE 10 MG/ML IJ SOLN
20.0000 mg | Freq: Two times a day (BID) | INTRAMUSCULAR | Status: DC
  Administered 2023-04-26 – 2023-04-27 (×3): 20 mg via INTRAVENOUS
  Filled 2023-04-26 (×3): qty 2

## 2023-04-26 NOTE — Care Management Important Message (Signed)
Important Message  Patient Details IM Letter given. Name: [name] MRN: [id number] Date of Birth: 18-Apr-1951   Medicare Important Message Given:  Yes     Caren Macadam 04/26/2023, 11:23 AM

## 2023-04-26 NOTE — Progress Notes (Signed)
Physical Therapy Treatment Patient Details Name: [name] MRN: [id number] DOB: Nov 25, 1950 Today's Date: 04/26/2023   History of Present Illness 72 yo female admitted wth sepsis, AKI. Hx of colon ca, liver cirrhosis, hepatic encephalopathy, fall, L wrist fx 01/2023, SVT, adrenal insufficiency    PT Comments    Pt continues to participate well. Husband present and very supportive. Pt reports dyspnea and fatigue with ambulation on today.   Recommendations for follow up therapy are one component of a multi-disciplinary discharge planning process, led by the attending physician.  Recommendations may be updated based on patient status, additional functional criteria and insurance authorization.  Follow Up Recommendations       Assistance Recommended at Discharge Frequent or constant Supervision/Assistance  Patient can return home with the following Assistance with cooking/housework;Assist for transportation;Help with stairs or ramp for entrance;Direct supervision/assist for financial management;A little help with walking and/or transfers;A little help with bathing/dressing/bathroom   Equipment Recommendations  None recommended by PT    Recommendations for Other Services       Precautions / Restrictions Precautions Precautions: Fall Restrictions Weight Bearing Restrictions: No     Mobility  Bed Mobility Overal bed mobility: Modified Independent             General bed mobility comments: increased time    Transfers Overall transfer level: Needs assistance Equipment used: Rolling walker (2 wheels) Transfers: Sit to/from Stand Sit to Stand: Supervision           General transfer comment: Supv for safety.    Ambulation/Gait Ambulation/Gait assistance: Supervision Gait Distance (Feet): 350 Feet (60 feet without a device) Assistive device: Rolling walker (2 wheels) Gait Pattern/deviations: Step-through pattern, Decreased stride length       General Gait  Details: Min guard A without device. Supv with RW. Dyspnea 2/4 after ~200 feet   Stairs             Wheelchair Mobility    Modified Rankin (Stroke Patients Only)       Balance Overall balance assessment: History of Falls, Needs assistance         Standing balance support: Bilateral upper extremity supported, During functional activity, Reliant on assistive device for balance Standing balance-Leahy Scale: Fair                              Cognition Arousal/Alertness: Awake/alert Behavior During Therapy: WFL for tasks assessed/performed Overall Cognitive Status: Within Functional Limits for tasks assessed                                 General Comments: mild confusion but appropriate        Exercises      General Comments        Pertinent Vitals/Pain Pain Assessment Pain Assessment: No/denies pain    Home Living                          Prior Function            PT Goals (current goals can now be found in the care plan section) Progress towards PT goals: Progressing toward goals    Frequency    Min 3X/week      PT Plan Current plan remains appropriate    Co-evaluation              AM-PAC  PT "6 Clicks" Mobility   Outcome Measure  Help needed turning from your back to your side while in a flat bed without using bedrails?: None Help needed moving from lying on your back to sitting on the side of a flat bed without using bedrails?: None Help needed moving to and from a bed to a chair (including a wheelchair)?: None Help needed standing up from a chair using your arms (e.g., wheelchair or bedside chair)?: None Help needed to walk in hospital room?: A Little Help needed climbing 3-5 steps with a railing? : A Little 6 Click Score: 22    End of Session Equipment Utilized During Treatment: Gait belt Activity Tolerance: Patient tolerated treatment well Patient left: in chair;with call bell/phone within  reach;with chair alarm set;with family/visitor present   PT Visit Diagnosis: History of falling (Z91.81);Unsteadiness on feet (R26.81);Muscle weakness (generalized) (M62.81)     Time: 8657-8469 PT Time Calculation (min) (ACUTE ONLY): 30 min  Charges:  $Gait Training: 23-37 mins                       Faye Ramsay, PT Acute Rehabilitation  Office: [phone]

## 2023-04-26 NOTE — Progress Notes (Signed)
SATURATION QUALIFICATIONS: (This note is used to comply with regulatory documentation for home oxygen)  Patient Saturations on Room Air at Rest = 95%  Patient Saturations on Room Air while Ambulating = 96% 

## 2023-04-26 NOTE — Progress Notes (Signed)
Mobility Specialist - Progress Note   04/26/23 1448  Mobility  Activity Ambulated with assistance in hallway;Ambulated with assistance to bathroom  Level of Assistance Standby assist, set-up cues, supervision of patient - no hands on  Assistive Device Front wheel walker  Distance Ambulated (ft) 150 ft  Activity Response Tolerated well  Mobility Referral Yes  $Mobility charge 1 Mobility  Mobility Specialist Start Time (ACUTE ONLY) 0219  Mobility Specialist Stop Time (ACUTE ONLY) 0247  Mobility Specialist Time Calculation (min) (ACUTE ONLY) 28 min   Pt received in bed and agreeable to mobility. Prior to ambulating pt requested assistance to bathroom. Once standing pt had a moment of LOB that was corrected with gait belt. No complaints during session. Pt to recliner after session with all needs met. Chair alarm on.  [facility]

## 2023-04-26 NOTE — Plan of Care (Signed)
  Problem: Health Behavior/Discharge Planning: Goal: Ability to manage health-related needs will improve Outcome: Progressing   Problem: Clinical Measurements: Goal: Ability to maintain clinical measurements within normal limits will improve Outcome: Progressing Goal: Will remain free from infection Outcome: Progressing Goal: Diagnostic test results will improve Outcome: Progressing Goal: Respiratory complications will improve Outcome: Progressing Goal: Cardiovascular complication will be avoided Outcome: Progressing   Problem: Activity: Goal: Risk for activity intolerance will decrease Outcome: Progressing   Problem: Coping: Goal: Level of anxiety will decrease Outcome: Progressing   Problem: Pain Managment: Goal: General experience of comfort will improve Outcome: Progressing   

## 2023-04-26 NOTE — Progress Notes (Signed)
PROGRESS NOTE   [name]  WUJ:[id number] DOB: 09/08/1951 DOA: 04/21/2023 PCP: Eden Emms, NP   Date of Service: the patient was seen and examined on 04/26/2023  Brief Narrative:  72 year old female with past medical history of colon cancer status post partial colectomy and chemotherapy, liver cirrhosis secondary to oxaliplatin chemotherapy status post TIPS procedure in 2012, hepatic encephalopathy, adrenal insufficiency (diagnosed during 02/2023 hospitalization), hypothyroidism, SVT who presented to [facility] emergency department on 5/29 with confusion.  Upon evaluation in the emergency department patient found to be septic with septic shock requiring initiation of Levophed for vasopressor support.  Patient was placed on aggressive intravenous volume resuscitation and broad-spectrum intravenous antibiotics.  PCCM was contacted and the patient was admitted to the intensive care unit.  Concerning the patient's sepsis, the source was felt to be secondary to a right thigh cellulitis.  Patient was concurrently found to have group B strep bacteremia with group B strep in the urine as well.  Antibiotic regimen was de-escalated to Ancef monotherapy.  In the days that followed patient clinically improved and patient was weaned off of vasopressors on 5/30.  Patient was transferred to the hospitalist service on 6/1.   Assessment and Plan: Sepsis due to group B Streptococcus with acute renal failure and septic shock (HCC) Patient has been weaned off of Levophed since 5/30 Transition to oral Augmentin for treatment of multifocal infection with bibasilar pneumonia, urinary tract infection, and group B strep bacteremia  Leukocytosis resolved  AKI (acute kidney injury) (HCC) Resolved with intravenous volume resuscitation and treatment of underlying infection. Strict input and output monitoring Avoid nephrotoxic agents Monitoring renal function and electrolytes with serial  chemistries  Acute hepatic encephalopathy (HCC) Improving according to family with treatment of underlying infection and reduction of high-dose steroids. Continuing melatonin nightly to regulate sleep cycle. Ammonia currently 41 which is much better than what patient ammonia level is when she has had flareups of her hepatic encephalopathy in the past and therefore I do not believe that this is contributing at this time.  Will obtain repeat ammonia level in the morning prior to discharge. No evidence of focal neurologic deficit Vitamin B12 and folate unremarkable. TSH 12/2022 unremarkable  Adrenal insufficiency (HCC) Recent diagnosis with adrenal insufficiency during 02/2023 hospitalization Continue to doing to reduce hydrocortisone dosing 10 mg 3 times daily today followed by 5 mg 3 times daily beginning tomorrow Plan is for patient to follow-up as an outpatient with endocrinology to confirm diagnosis after discharge  Decompensated liver cirrhosis with portal HTN and gastric varices Supportive care Continue lactulose to maintain at least 3 loose stools daily. Placing patient back on spironolactone and furosemide considering development of peripheral edema during this hospitalization.   Hypothyroidism Resume home regimen of Synthroid   Tachycardia-bradycardia syndrome (HCC) Per review of older records patient has been diagnosed with tach bradycardia syndrome in the past, managed conservatively Patient continues to exhibit bouts of asymptomatic bradycardia Patient is normotensive Conservative management   Subjective:  Abdominal pain resolved.  Family reports improving confusion.  Family reports patient has substantial bilateral lower extremity swelling.  Physical Exam:  Vitals:   04/26/23 0532 04/26/23 1327 04/26/23 1830 04/26/23 1946  BP: (!) 141/75 (!) 108/58  128/65  Pulse: (!) 49 (!) 53  (!) 50  Resp: 18 15  19   Temp: 97.9 F (36.6 C) (!) 96.2 F (35.7 C) (!) 97.5 F  (36.4 C) (!) 97.4 F (36.3 C)  TempSrc: Oral Axillary Axillary Oral  SpO2: 93% 98%  96%  Weight: 78.3 kg     Height:        Constitutional: Awake alert and oriented.  Patient is currently not in distress. Skin: Area of erythema and induration of the right medial thigh resolved.Marland Kitchen  Poor skin turgor noted. Eyes: Pupils are equally reactive to light.  No evidence of scleral icterus or conjunctival pallor.  ENMT: Moist mucous membranes noted.  Posterior pharynx clear of any exudate or lesions.   Respiratory: clear to auscultation bilaterally, no wheezing, no crackles. Normal respiratory effort. No accessory muscle use.  Cardiovascular: Regular rate and rhythm, no murmurs / rubs / gallops.  Substantial bilateral lower extremity pitting edema noted.  2+ pedal pulses. No carotid bruits.  Abdomen: Mild epigastric tenderness.  Abdomen is soft.  No evidence of intra-abdominal masses.  Positive bowel sounds noted in all quadrants.   Musculoskeletal: No joint deformity upper and lower extremities. Good ROM, no contractures. Normal muscle tone.    Data Reviewed:  I have personally reviewed and interpreted labs, imaging.  Significant findings are   CBC: Recent Labs  Lab 04/21/23 1750 04/22/23 0315 04/23/23 0321 04/24/23 0247 04/25/23 0410 04/26/23 0423  WBC 10.3 22.7* 16.4* 16.6* 11.2* 6.0  NEUTROABS 9.7*  --   --   --  9.7* 4.1  HGB 9.9* 10.5* 9.4* 9.3* 9.5* 9.9*  HCT 30.1* 31.6* 27.9* 28.6* 29.0* 30.2*  MCV 106.4* 105.7* 105.3* 105.5* 104.7* 106.0*  PLT 104* 143* 80* 87* 90* 109*   Basic Metabolic Panel: Recent Labs  Lab 04/22/23 0315 04/23/23 0321 04/24/23 0247 04/25/23 0410 04/26/23 0423  NA 131* 134* 136 135 135  K 4.7 5.0 4.8 4.2 3.9  CL 104 106 108 109 110  CO2 18* 20* 19* 21* 19*  GLUCOSE 103* 104* 92 111* 94  BUN 29* 32* 38* 37* 30*  CREATININE 1.33* 1.14* 1.06* 0.94 0.73  CALCIUM 8.5* 9.0 9.2 8.8* 8.6*  MG 1.2* 2.4 2.2 2.0 1.7  PHOS 4.0 4.2 3.5 2.8 2.8    GFR: Estimated Creatinine Clearance: 65.3 mL/min (by C-G formula based on SCr of 0.73 mg/dL). Liver Function Tests: Recent Labs  Lab 04/21/23 1750 04/22/23 0315 04/25/23 0410 04/26/23 0423  AST 57* 56* 85* 74*  ALT 43 41 37 31  ALKPHOS 153* 106 157* 183*  BILITOT 2.0* 3.0* 0.9 0.9  PROT 6.0* 6.4* 6.3* 5.8*  ALBUMIN 2.7* 3.0* 2.8* 2.5*    Coagulation Profile: Recent Labs  Lab 04/21/23 1750  INR 1.7*      Code Status:  Full code.  Code status decision has been confirmed with: Husband at the bedside Family Communication: Plan of care has been discussed with husband and daughter at the bedside.   Severity of Illness:  The appropriate patient status for this patient is INPATIENT. Inpatient status is judged to be reasonable and necessary in order to provide the required intensity of service to ensure the patient's safety. The patient's presenting symptoms, physical exam findings, and initial radiographic and laboratory data in the context of their chronic comorbidities is felt to place them at high risk for further clinical deterioration. Furthermore, it is not anticipated that the patient will be medically stable for discharge from the hospital within 2 midnights of admission.   * I certify that at the point of admission it is my clinical judgment that the patient will require inpatient hospital care spanning beyond 2 midnights from the point of admission due to high intensity of service, high risk for  further deterioration and high frequency of surveillance required.*  Time spent:  50 minutes  Author:  Marinda Elk MD  04/26/2023

## 2023-04-27 ENCOUNTER — Ambulatory Visit: Payer: Medicare Other | Admitting: Occupational Therapy

## 2023-04-27 ENCOUNTER — Ambulatory Visit: Payer: Medicare Other

## 2023-04-27 ENCOUNTER — Other Ambulatory Visit (HOSPITAL_COMMUNITY): Payer: Self-pay

## 2023-04-27 DIAGNOSIS — K7682 Hepatic encephalopathy: Secondary | ICD-10-CM | POA: Diagnosis not present

## 2023-04-27 DIAGNOSIS — N179 Acute kidney failure, unspecified: Secondary | ICD-10-CM | POA: Diagnosis not present

## 2023-04-27 DIAGNOSIS — A401 Sepsis due to streptococcus, group B: Secondary | ICD-10-CM | POA: Diagnosis not present

## 2023-04-27 DIAGNOSIS — E2740 Unspecified adrenocortical insufficiency: Secondary | ICD-10-CM | POA: Diagnosis not present

## 2023-04-27 LAB — CBC WITH DIFFERENTIAL/PLATELET
Abs Immature Granulocytes: 0.44 10*3/uL — ABNORMAL HIGH (ref 0.00–0.07)
Basophils Absolute: 0 10*3/uL (ref 0.0–0.1)
Basophils Relative: 0 %
Eosinophils Absolute: 0.2 10*3/uL (ref 0.0–0.5)
Eosinophils Relative: 3 %
HCT: 30.7 % — ABNORMAL LOW (ref 36.0–46.0)
Hemoglobin: 10.2 g/dL — ABNORMAL LOW (ref 12.0–15.0)
Immature Granulocytes: 8 %
Lymphocytes Relative: 17 %
Lymphs Abs: 0.9 10*3/uL (ref 0.7–4.0)
MCH: 34.3 pg — ABNORMAL HIGH (ref 26.0–34.0)
MCHC: 33.2 g/dL (ref 30.0–36.0)
MCV: 103.4 fL — ABNORMAL HIGH (ref 80.0–100.0)
Monocytes Absolute: 0.9 10*3/uL (ref 0.1–1.0)
Monocytes Relative: 16 %
Neutro Abs: 3 10*3/uL (ref 1.7–7.7)
Neutrophils Relative %: 56 %
Platelets: 156 10*3/uL (ref 150–400)
RBC: 2.97 MIL/uL — ABNORMAL LOW (ref 3.87–5.11)
RDW: 16.2 % — ABNORMAL HIGH (ref 11.5–15.5)
WBC: 5.4 10*3/uL (ref 4.0–10.5)
nRBC: 0.6 % — ABNORMAL HIGH (ref 0.0–0.2)

## 2023-04-27 LAB — COMPREHENSIVE METABOLIC PANEL
ALT: 28 U/L (ref 0–44)
AST: 66 U/L — ABNORMAL HIGH (ref 15–41)
Albumin: 2.3 g/dL — ABNORMAL LOW (ref 3.5–5.0)
Alkaline Phosphatase: 213 U/L — ABNORMAL HIGH (ref 38–126)
Anion gap: 6 (ref 5–15)
BUN: 27 mg/dL — ABNORMAL HIGH (ref 8–23)
CO2: 25 mmol/L (ref 22–32)
Calcium: 8.3 mg/dL — ABNORMAL LOW (ref 8.9–10.3)
Chloride: 107 mmol/L (ref 98–111)
Creatinine, Ser: 0.73 mg/dL (ref 0.44–1.00)
GFR, Estimated: >60 mL/min (ref >60)
Glucose, Bld: 92 mg/dL (ref 70–99)
Potassium: 3.5 mmol/L (ref 3.5–5.1)
Sodium: 138 mmol/L (ref 135–145)
Total Bilirubin: 1 mg/dL (ref 0.3–1.2)
Total Protein: 5.3 g/dL — ABNORMAL LOW (ref 6.5–8.1)

## 2023-04-27 LAB — AMMONIA: Ammonia: 24 umol/L (ref 9–35)

## 2023-04-27 LAB — CULTURE, BLOOD (ROUTINE X 2)

## 2023-04-27 LAB — MAGNESIUM: Magnesium: 1.4 mg/dL — ABNORMAL LOW (ref 1.7–2.4)

## 2023-04-27 MED ORDER — MAGNESIUM SULFATE 4 GM/100ML IV SOLN
4.0000 g | Freq: Once | INTRAVENOUS | Status: AC
  Administered 2023-04-27: 4 g via INTRAVENOUS
  Filled 2023-04-27: qty 100

## 2023-04-27 MED ORDER — FUROSEMIDE 20 MG PO TABS
20.0000 mg | ORAL_TABLET | Freq: Every day | ORAL | 2 refills | Status: DC
  Filled 2023-04-27: qty 30, 30d supply, fill #0
  Filled 2023-05-28: qty 30, 30d supply, fill #1

## 2023-04-27 MED ORDER — SPIRONOLACTONE 25 MG PO TABS: 25.0000 mg | ORAL_TABLET | Freq: Every day | ORAL | 2 refills | Status: DC

## 2023-04-27 MED ORDER — POTASSIUM CHLORIDE CRYS ER 20 MEQ PO TBCR: 40.0000 meq | EXTENDED_RELEASE_TABLET | Freq: Once | ORAL | Status: DC

## 2023-04-27 MED ORDER — AMOXICILLIN-POT CLAVULANATE 875-125 MG PO TABS
1.0000 | ORAL_TABLET | Freq: Two times a day (BID) | ORAL | 0 refills | Status: AC
  Filled 2023-04-27: qty 24, 12d supply, fill #0

## 2023-04-27 MED ORDER — MELATONIN 10 MG PO TABS
10.0000 mg | ORAL_TABLET | Freq: Every evening | ORAL | 0 refills | Status: DC | PRN
  Filled 2023-04-27: qty 30, fill #0

## 2023-04-27 NOTE — Plan of Care (Signed)
  Problem: Education: Goal: Knowledge of General Education information will improve Description: Including pain rating scale, medication(s)/side effects and non-pharmacologic comfort measures 04/27/2023 1330 by Val Eagle, RN Outcome: Adequate for Discharge 04/27/2023 1330 by Val Eagle, RN Outcome: Progressing   Problem: Health Behavior/Discharge Planning: Goal: Ability to manage health-related needs will improve 04/27/2023 1330 by Val Eagle, RN Outcome: Adequate for Discharge 04/27/2023 1330 by Val Eagle, RN Outcome: Progressing   Problem: Clinical Measurements: Goal: Ability to maintain clinical measurements within normal limits will improve Outcome: Adequate for Discharge Goal: Will remain free from infection Outcome: Adequate for Discharge Goal: Diagnostic test results will improve Outcome: Adequate for Discharge Goal: Respiratory complications will improve Outcome: Adequate for Discharge Goal: Cardiovascular complication will be avoided Outcome: Adequate for Discharge   Problem: Activity: Goal: Risk for activity intolerance will decrease Outcome: Adequate for Discharge   Problem: Nutrition: Goal: Adequate nutrition will be maintained Outcome: Adequate for Discharge   Problem: Coping: Goal: Level of anxiety will decrease Outcome: Adequate for Discharge   Problem: Elimination: Goal: Will not experience complications related to bowel motility Outcome: Adequate for Discharge Goal: Will not experience complications related to urinary retention Outcome: Adequate for Discharge   Problem: Elimination: Goal: Will not experience complications related to urinary retention Outcome: Adequate for Discharge   Problem: Pain Managment: Goal: General experience of comfort will improve Outcome: Adequate for Discharge   Problem: Safety: Goal: Ability to remain free from injury will improve Outcome: Adequate for Discharge   Problem: Skin Integrity: Goal:  Risk for impaired skin integrity will decrease Outcome: Adequate for Discharge

## 2023-04-27 NOTE — TOC Transition Note (Signed)
Transition of Care [facility]) - CM/SW Discharge Note   Patient Details  Name: Shiran Claw MRN: [id number] Date of Birth: [birth year]  Transition of Care [facility]) CM/SW Contact:  Larrie Kass, LCSW Phone Number: 04/27/2023, 10:57 AM   Clinical Narrative:     Pt was recommended for OP PT, referral was made. No further TOC needs. TOC sign off.     Barriers to Discharge: Continued Medical Work up   Patient Goals and CMS Choice      Discharge Placement                      Patient and family notified of of transfer: 04/27/23  Discharge Plan and Services Additional resources added to the After Visit Summary for                                       Social Determinants of Health (SDOH) Interventions SDOH Screenings   Food Insecurity: No Food Insecurity (04/22/2023)  Housing: Low Risk  (04/22/2023)  Transportation Needs: No Transportation Needs (04/22/2023)  Utilities: Not At Risk (04/22/2023)  Alcohol Screen: Low Risk  (03/24/2023)  Depression (PHQ2-9): Low Risk  (03/24/2023)  Financial Resource Strain: Low Risk  (03/24/2023)  Physical Activity: Inactive (03/24/2023)  Social Connections: Moderately Integrated (03/24/2023)  Stress: No Stress Concern Present (03/24/2023)  Tobacco Use: Low Risk  (04/21/2023)     Readmission Risk Interventions    03/02/2023    3:19 PM  Readmission Risk Prevention Plan  Transportation Screening Complete  PCP or Specialist Appt within 3-5 Days Complete  HRI or Home Care Consult Complete  Social Work Consult for Recovery Care Planning/Counseling Complete  Medication Review Oceanographer) Complete

## 2023-04-27 NOTE — Progress Notes (Signed)
Physical Therapy Treatment Patient Details Name: [name] MRN: [id number] DOB: [birth year] Today's Date: 04/27/2023   History of Present Illness 72 yo female admitted wth sepsis, AKI. Hx of colon ca, liver cirrhosis, hepatic encephalopathy, fall, L wrist fx 01/2023, SVT, adrenal insufficiency    PT Comments    Pt continues to participate well. Husband present and very supportive. Will continue to follow.    Recommendations for follow up therapy are one component of a multi-disciplinary discharge planning process, led by the attending physician.  Recommendations may be updated based on patient status, additional functional criteria and insurance authorization.  Follow Up Recommendations       Assistance Recommended at Discharge Frequent or constant Supervision/Assistance  Patient can return home with the following Assistance with cooking/housework;Assist for transportation;Help with stairs or ramp for entrance;Direct supervision/assist for financial management;A little help with walking and/or transfers;A little help with bathing/dressing/bathroom   Equipment Recommendations  None recommended by PT    Recommendations for Other Services       Precautions / Restrictions Precautions Precautions: Fall Restrictions Weight Bearing Restrictions: No     Mobility  Bed Mobility Overal bed mobility: Modified Independent             General bed mobility comments: increased time    Transfers Overall transfer level: Needs assistance Equipment used: Rolling walker (2 wheels) Transfers: Sit to/from Stand Sit to Stand: Supervision           General transfer comment: Supv for safety. Cues for safety.    Ambulation/Gait Ambulation/Gait assistance: Supervision Gait Distance (Feet): 350 Feet Assistive device: Rolling walker (2 wheels) Gait Pattern/deviations: Step-through pattern, Decreased stride length       General Gait Details: Supv with RW. Dyspnea 2/4 after ~200  feet. O2 93% on RA, HR 79 bpm   Stairs             Wheelchair Mobility    Modified Rankin (Stroke Patients Only)       Balance Overall balance assessment: History of Falls, Needs assistance         Standing balance support: Bilateral upper extremity supported, During functional activity, Reliant on assistive device for balance Standing balance-Leahy Scale: Fair                              Cognition Arousal/Alertness: Awake/alert Behavior During Therapy: WFL for tasks assessed/performed Overall Cognitive Status: Within Functional Limits for tasks assessed                                 General Comments: mild confusion but appropriate        Exercises      General Comments        Pertinent Vitals/Pain Pain Assessment Pain Assessment: No/denies pain    Home Living                          Prior Function            PT Goals (current goals can now be found in the care plan section) Progress towards PT goals: Progressing toward goals    Frequency    Min 3X/week      PT Plan Current plan remains appropriate    Co-evaluation              AM-PAC PT "6 Clicks" Mobility  Outcome Measure  Help needed turning from your back to your side while in a flat bed without using bedrails?: None Help needed moving from lying on your back to sitting on the side of a flat bed without using bedrails?: None Help needed moving to and from a bed to a chair (including a wheelchair)?: None Help needed standing up from a chair using your arms (e.g., wheelchair or bedside chair)?: A Little Help needed to walk in hospital room?: A Little Help needed climbing 3-5 steps with a railing? : A Little 6 Click Score: 21    End of Session Equipment Utilized During Treatment: Gait belt Activity Tolerance: Patient tolerated treatment well Patient left: in chair;with call bell/phone within reach;with chair alarm set;with  family/visitor present   PT Visit Diagnosis: History of falling (Z91.81);Unsteadiness on feet (R26.81);Muscle weakness (generalized) (M62.81)     Time: 1020-1040 PT Time Calculation (min) (ACUTE ONLY): 20 min  Charges:  $Gait Training: 8-22 mins                         Faye Ramsay, PT Acute Rehabilitation  Office: [phone]

## 2023-04-27 NOTE — Plan of Care (Signed)
  Problem: Education: Goal: Knowledge of General Education information will improve Description Including pain rating scale, medication(s)/side effects and non-pharmacologic comfort measures Outcome: Progressing   Problem: Health Behavior/Discharge Planning: Goal: Ability to manage health-related needs will improve Outcome: Progressing   

## 2023-04-27 NOTE — Discharge Instructions (Signed)
Please take all prescribed medications exactly as instructed including taking the entire course of your Augmentin antibiotic for treatment of your pneumonia and bacteria in your blood. You are also being prescribed a regimen of as needed melatonin which can be taken approximately 2 hours prior to going to bed and should help regulate your sleep cycle taken on an as needed or scheduled basis. You have been advised to resume your diuretic regimen of 25 mg of spironolactone daily and 20 mg of Lasix daily.  Upon follow-up with your gastroenterologist or primary care provider may choose to increase this regimen as tolerated. Please consume a low-sodium diet Please increase your physical activity as tolerated, getting out of bed with assistance. Please maintain all outpatient follow-up appointments including follow-up with your primary care provider and your gastroenterologist Dr. Marina Goodell Please return to the emergency department if you develop worsening shortness of breath, confusion, fevers in excess of 100.4 F, weakness or inability to tolerate oral intake.

## 2023-04-27 NOTE — Discharge Summary (Addendum)
Physician Discharge Summary   Patient: [name] MRN: [id number] DOB: [birth year]  Admit date:     04/21/2023  Discharge date: 04/27/23  Discharge Physician: Marinda Elk   PCP: Eden Emms, NP   Recommendations at discharge:   Please take all prescribed medications exactly as instructed including taking the entire course of your Augmentin antibiotic for treatment of your pneumonia and bacteria in your blood. You are also being prescribed a regimen of as needed melatonin which can be taken approximately 2 hours prior to going to bed and should help regulate your sleep cycle taken on an as needed or scheduled basis. You have been advised to resume your diuretic regimen of 25 mg of spironolactone daily and 20 mg of Lasix daily.  Upon follow-up with your gastroenterologist or primary care provider may choose to increase this regimen as tolerated. Please consume a low-sodium diet Please increase your physical activity as tolerated, getting out of bed with assistance. Please maintain all outpatient follow-up appointments including follow-up with your primary care provider and your gastroenterologist Dr. Marina Goodell Please return to the emergency department if you develop worsening shortness of breath, confusion, fevers in excess of 100.4 F, weakness or inability to tolerate oral intake.  Discharge Diagnoses: Active Problems:   Sepsis due to group B Streptococcus with acute renal failure and septic shock (HCC)   AKI (acute kidney injury) (HCC)   Acute hepatic encephalopathy (HCC)   Adrenal insufficiency (HCC)   Decompensated liver cirrhosis with portal HTN and gastric varices   Hypothyroidism   Tachycardia-bradycardia syndrome (HCC)  Resolved Problems:   * No resolved hospital problems. *   Hospital Course: 72 year old female with past medical history of colon cancer status post partial colectomy and chemotherapy, liver cirrhosis secondary to oxaliplatin chemotherapy status post  TIPS procedure in 2012, hepatic encephalopathy, adrenal insufficiency (diagnosed during 02/2023 hospitalization), hypothyroidism, SVT who presented to [facility] emergency department on 5/29 with confusion.  Upon evaluation in the emergency department patient found to be septic with septic shock requiring initiation of Levophed for vasopressor support.  Patient was placed on aggressive intravenous volume resuscitation and broad-spectrum intravenous antibiotics.  PCCM was contacted and the patient was admitted to the intensive care unit.  Concerning the patient's sepsis, the source was felt to be secondary to a right thigh cellulitis.  Patient was concurrently found to have group B strep / strep agalactae which was also found to be growing in the urine as well. Antibiotic regimen was de-escalated to Ancef monotherapy.  In the days that followed patient clinically improved and patient was weaned off of vasopressors on 5/30.   Patient was transferred to the hospitalist service on 6/1.  Patient continued to slowly clinically improve in the days that followed.  Patient's mentation and strength improved.  Due to aggressive intravenous volume resuscitation early in the hospitalization patient did develop some significant peripheral edema.  This was managed with reinitiation of her previous home regimen of Lasix and spironolactone with some improvement.  Despite the Streptococcus agalactiae bacteremia, serial blood cultures performed on 5/30 confirm clearance on antibiotic therapy.  Clinically, the patient exhibited no evidence of infective endocarditis or other complications and echocardiogram performed during this hospitalization did not reveal any obvious evidence of vegetation.  It was felt that a TEE was not necessary.  Patient was eventually transition to oral Augmentin therapy to complete a 14-day course of antibiotics at time of discharge.  Physical therapy had evaluated the patient during this  hospitalization and recommended outpatient PT services.  Referrals to PT and OT were placed prior to discharge.  Patient was eventually discharged home in improved and stable condition on 04/27/2023   Consultants: None Procedures performed: None  Disposition: Home Diet recommendation:  Discharge Diet Orders (From admission, onward)     Start     Ordered   04/27/23 0000  Diet - low sodium heart healthy        04/27/23 1240           Cardiac diet  DISCHARGE MEDICATION: Allergies as of 04/27/2023       Reactions   Contrast Media [iodinated Contrast Media] Hives        Medication List     STOP taking these medications    rifaximin 550 MG Tabs tablet Commonly known as: XIFAXAN       TAKE these medications    acidophilus Caps capsule Take 1 capsule by mouth daily.   amoxicillin-clavulanate 875-125 MG tablet Commonly known as: AUGMENTIN Take 1 tablet by mouth every 12 (twelve) hours for 12 days. First dose the evening of 6/4.   CRANBERRY PO Take 1 tablet by mouth daily.   estradiol 0.1 MG/GM vaginal cream Commonly known as: ESTRACE Place 1 Applicatorful vaginally every other day.   furosemide 20 MG tablet Commonly known as: Lasix Take 1 tablet (20 mg total) by mouth daily.   hydrocortisone 5 MG tablet Commonly known as: Cortef Take 1 tablet (5 mg total) by mouth 3 (three) times daily.   lactulose 10 GM/15ML solution Commonly known as: CHRONULAC Take 45 mLs (30 g total) by mouth 3 (three) times daily.   levOCARNitine 330 MG tablet Commonly known as: CARNITOR Take 330 mg by mouth 3 (three) times daily.   liver oil-zinc oxide 40 % ointment Commonly known as: DESITIN Apply topically daily as needed for irritation. What changed: how much to take   Melatonin 10 MG Tabs Take 10 mg by mouth at bedtime as needed (sleep).   Multi-Vitamin tablet Take 1 tablet by mouth daily.   polyethylene glycol 17 g packet Commonly known as: MIRALAX / GLYCOLAX Take 17  g by mouth daily as needed for mild constipation.   spironolactone 25 MG tablet Commonly known as: ALDACTONE Take 1 tablet (25 mg total) by mouth daily. What changed: medication strength   Synthroid 75 MCG tablet Generic drug: levothyroxine Take 1 tablet (75 mcg total) by mouth daily before breakfast. Must be seen in office for more refills.        Follow-up Information     Eden Emms, NP. Schedule an appointment as soon as possible for a visit in 1 week(s).   Specialties: Nurse Practitioner, Family Medicine Contact information: [address] [place] [address] [phone]         Hilarie Fredrickson, MD. Schedule an appointment as soon as possible for a visit in 2 week(s).   Specialty: Gastroenterology Contact information: [address] [place] [address] [phone]                 Discharge Exam: Filed Weights   04/25/23 0555 04/26/23 0532 04/27/23 0216  Weight: 80.1 kg 78.3 kg 75.2 kg    Constitutional: Awake alert and oriented x3, no associated distress.   Respiratory: Very mild bibasilar rales, no evidence of wheezing.  Normal respiratory effort. No accessory muscle use.  Cardiovascular: Regular rate and rhythm, no murmurs / rubs / gallops.  Notable pitting edema of the  distal bilateral lower extremities, improved from prior examinations.  2+ pedal pulses. No carotid bruits.  Abdomen: Abdomen is soft and nontender.  No evidence of intra-abdominal masses.  Positive bowel sounds noted in all quadrants.   Musculoskeletal: No joint deformity upper and lower extremities. Good ROM, no contractures. Normal muscle tone.     Condition at discharge: fair  The results of significant diagnostics from this hospitalization (including imaging, microbiology, ancillary and laboratory) are listed below for reference.   Imaging Studies: CT ABDOMEN PELVIS W CONTRAST  Result Date: 04/25/2023 CLINICAL DATA:  Abdominal tenderness, back pain and sepsis.  History of TIPSS. EXAM: CT ABDOMEN AND PELVIS WITH CONTRAST TECHNIQUE: Multidetector CT imaging of the abdomen and pelvis was performed using the standard protocol following bolus administration of intravenous contrast. RADIATION DOSE REDUCTION: This exam was performed according to the departmental dose-optimization program which includes automated exposure control, adjustment of the mA and/or kV according to patient size and/or use of iterative reconstruction technique. CONTRAST:  OMNIPAQUE IOHEXOL 300 MG/ML  SOLN COMPARISON:  Abdominal MRI without and with contrast 06/09/2021, and chest, abdomen and pelvis CT with IV contrast 05/25/2006. FINDINGS: Lower chest: There are small right-greater-than-left pleural effusions. On the right this is associated with dense consolidation with air bronchograms in the lower lobe basal segments. This is most likely due to right lower lobe pneumonia or aspiration. There is a small consolidation in the lateral basal left lower lobe which could be atelectasis or a small pneumonia, and elsewhere there are scattered atelectatic bands without further infiltrate. There is moderate panchamber cardiomegaly. There calcifications in the lower mitral ring. Hepatobiliary: The liver cirrhotic and mildly steatotic. There is a TIPS stent again noted with enhancement compatible with patency. There is no discrete mass enhancement of the liver. Mild diffuse heterogeneity appears similar to the postcontrast images of the MRI. There are several stones in the gallbladder, without wall thickening. The bile ducts are normal caliber. Pancreas: No abnormality. Spleen: Mildly enlarged but no more than previously, measures 14 cm length. There is a subcentimeter flash filling hemangioma anteriorly. There is another in the inferior tip of the spine. Adrenals/Urinary Tract: There is no adrenal mass. The kidneys enhance normally with symmetric delayed phase excoriation and no stones or hydronephrosis. The  bladder is unremarkable for the degree of distention. Stomach/Bowel: There is generalized fluid filling of the intestinal tract through the distal descending colon with formed stool filling the rectosigmoid segment. There is postsurgical change in the proximal ascending colon with prior appendectomy. There is mild dilatation of some of the mid to lower abdominal small bowel interspersed with mildly thickened and enhancing small bowel segments. Findings could be due to enteritis, congestive phenomenon or low-grade mid small-bowel obstruction which could be related to enteritis or other bowel wall edema. A transitional segment is not seen. Vascular/Lymphatic: There is mild aortoiliac calcific plaque. No AAA. Left pelvic venous congestion. No adenopathy is seen. There is diffuse body wall anasarca which was noted in 2022 but has worsened particularly in the lower abdomen and upper thighs. There are scattered air inclusions in the subcutaneous mid abdominal wall which may suggest recent injections. Reproductive: Uterus and bilateral adnexa are unremarkable. Other: There is mild free ascites throughout the abdomen and pelvis and scattered within the mesentery. There is no free air. No incarcerated hernia. There previously was no visible ascites. There are mild mesenteric congestive features. Musculoskeletal: Osteopenia. There are age indeterminate compression fractures with a moderate lower plate compression deformity of the  T12 vertebral body with slight retropulsion, moderate upper plate//central compression deformity of the L2 body, mild generalized upper plate compression deformity of L3. There are degenerative changes. There is lumbar facet hypertrophy with acquired spinal stenosis L3-4 and L4-5. No aggressive osseous lesions. IMPRESSION: 1. Small right-greater-than-left pleural effusions with dense consolidation in the basal segments of the right lower lobe, most likely due to pneumonia or aspiration. 2. Small  consolidation in the lateral basal left lower lobe which could be atelectasis or a small pneumonia. 3. Cardiomegaly without evidence of CHF. 4. Cirrhotic liver with grossly patent TIPS stent, mild splenomegaly and mild ascites. 5. Cholelithiasis. 6. Fluid filling of the intestinal tract through the distal descending colon with formed stool filling the rectosigmoid segment. 7. Mild dilatation of some of the mid to lower abdominal small bowel interspersed with mildly thickened and enhancing small bowel segments. Findings could be due to enteritis, congestive phenomenon or low-grade mid small-bowel obstruction which could be related to enteritis or other bowel wall edema. No transitional segment is seen. 8. Increasing body wall anasarca and mesenteric congestive changes since 2022. 9. Osteopenia and degenerative change with age-indeterminate compression fractures of T12, L2 and L3. 10. Scattered air inclusions in the subcutaneous mid abdominal wall which may suggest recent injections. 11. Left pelvic venous congestion. 12. Aortic atherosclerosis. Aortic Atherosclerosis (ICD10-I70.0). Electronically Signed   By: Almira Bar M.D.   On: 04/25/2023 05:28   ECHOCARDIOGRAM LIMITED  Result Date: 04/22/2023    ECHOCARDIOGRAM LIMITED REPORT   Patient Name:   [name] Date of Exam: 04/22/2023 Medical Rec #:  [id number]       Height:       64.0 in Accession #:    [id number]      Weight:       167.8 lb Date of Birth:  Mar 10, 1951        BSA:          1.816 m Patient Age:    71 years        BP:           103/52 mmHg Patient Gender: F               HR:           70 bpm. Exam Location:  Inpatient Procedure: Limited Echo, Limited Color Doppler and Cardiac Doppler Indications:    Shock  History:        Patient has prior history of Echocardiogram examinations, most                 recent 03/03/2023. Cancer, portal HTN, cirrhosis,                 Arrythmias:Bradycardia; Risk Factors:Hypertension and sepsis,                  cellulitis.  Sonographer:    Wallie Char Referring Phys: [id number] Vinnie Level SMITH IMPRESSIONS  1. Left ventricular ejection fraction, by estimation, is 55%. The left ventricle has normal function. The left ventricle has no regional wall motion abnormalities.  2. Right ventricular systolic function is normal. The right ventricular size is mildly enlarged. There is moderately elevated pulmonary artery systolic pressure. The estimated right ventricular systolic pressure is 53.4 mmHg.  3. Left atrial size was moderately dilated.  4. The mitral valve is degenerative. Trivial mitral valve regurgitation. Moderate mitral annular calcification.  5. Tricuspid valve regurgitation is mild to moderate.  6. The aortic valve is tricuspid. Aortic valve regurgitation  is not visualized. Aortic valve sclerosis is present, with no evidence of aortic valve stenosis.  7. The inferior vena cava is dilated in size with <50% respiratory variability, suggesting right atrial pressure of 15 mmHg. Comparison(s): Compared to prior TTE on 02/2022, the EF appears slightly less robus but is within normal range. Otherwise, there is no significant change. FINDINGS  Left Ventricle: Left ventricular ejection fraction, by estimation, is 55%. The left ventricle has normal function. The left ventricle has no regional wall motion abnormalities. The left ventricular internal cavity size was normal in size. There is no left ventricular hypertrophy. Right Ventricle: The right ventricular size is mildly enlarged. No increase in right ventricular wall thickness. Right ventricular systolic function is normal. There is moderately elevated pulmonary artery systolic pressure. The tricuspid regurgitant velocity is 3.10 m/s, and with an assumed right atrial pressure of 15 mmHg, the estimated right ventricular systolic pressure is 53.4 mmHg. Left Atrium: Left atrial size was moderately dilated. Pericardium: There is no evidence of pericardial effusion. Mitral Valve:  The mitral valve is degenerative in appearance. There is mild thickening of the mitral valve leaflet(s). There is mild calcification of the mitral valve leaflet(s). Moderate mitral annular calcification. Trivial mitral valve regurgitation. MV peak gradient, 8.4 mmHg. The mean mitral valve gradient is 4.0 mmHg. Tricuspid Valve: The tricuspid valve is normal in structure. Tricuspid valve regurgitation is mild to moderate. Aortic Valve: The aortic valve is tricuspid. Aortic valve regurgitation is not visualized. Aortic valve sclerosis is present, with no evidence of aortic valve stenosis. Aortic valve mean gradient measures 8.0 mmHg. Aortic valve peak gradient measures 14.3 mmHg. Aortic valve area, by VTI measures 1.85 cm. Pulmonic Valve: The pulmonic valve was not well visualized. Pulmonic valve regurgitation is trivial. Aorta: The aortic root is normal in size and structure. Venous: The inferior vena cava is dilated in size with less than 50% respiratory variability, suggesting right atrial pressure of 15 mmHg. LEFT VENTRICLE PLAX 2D LVIDd:         4.70 cm LVIDs:         3.10 cm LV PW:         1.10 cm LV IVS:        1.00 cm LVOT diam:     1.60 cm LV SV:         77 LV SV Index:   42 LVOT Area:     2.01 cm  LV Volumes (MOD) LV vol d, MOD A2C: 93.2 ml LV vol d, MOD A4C: 88.8 ml LV vol s, MOD A2C: 45.0 ml LV vol s, MOD A4C: 39.8 ml LV SV MOD A2C:     48.2 ml LV SV MOD A4C:     88.8 ml LV SV MOD BP:      48.6 ml RIGHT VENTRICLE          IVC RV Basal diam:  4.30 cm  IVC diam: 3.50 cm TAPSE (M-mode): 3.0 cm LEFT ATRIUM             Index        RIGHT ATRIUM           Index LA diam:        4.30 cm 2.37 cm/m   RA Area:     20.60 cm LA Vol (A2C):   59.9 ml 32.99 ml/m  RA Volume:   64.60 ml  35.58 ml/m LA Vol (A4C):   55.5 ml 30.57 ml/m LA Biplane Vol: 58.6 ml 32.28 ml/m  AORTIC  VALVE AV Area (Vmax):    1.74 cm AV Area (Vmean):   1.74 cm AV Area (VTI):     1.85 cm AV Vmax:           189.00 cm/s AV Vmean:           134.000 cm/s AV VTI:            0.417 m AV Peak Grad:      14.3 mmHg AV Mean Grad:      8.0 mmHg LVOT Vmax:         164.00 cm/s LVOT Vmean:        116.000 cm/s LVOT VTI:          0.383 m LVOT/AV VTI ratio: 0.92  AORTA Ao Root diam: 3.00 cm Ao Asc diam:  2.90 cm MITRAL VALVE                TRICUSPID VALVE MV Area (PHT): 3.65 cm     TR Peak grad:   38.4 mmHg MV Area VTI:   1.70 cm     TR Vmax:        310.00 cm/s MV Peak grad:  8.4 mmHg MV Mean grad:  4.0 mmHg     SHUNTS MV Vmax:       1.45 m/s     Systemic VTI:  0.38 m MV Vmean:      99.8 cm/s    Systemic Diam: 1.60 cm MV Decel Time: 208 msec MV E velocity: 177.00 cm/s MV A velocity: 142.00 cm/s MV E/A ratio:  1.25 Laurance Flatten MD Electronically signed by Laurance Flatten MD Signature Date/Time: 04/22/2023/2:04:42 PM    Final    LONG TERM MONITOR (3-14 DAYS)  Result Date: 04/22/2023   Patient had a minimum heart rate of 43 bpm, maximum heart rate of 125 bpm, and average heart rate of 71 bpm.   Predominant underlying rhythm was sinus rhythm.   Rare, relatively slow SVT, rate 125 bpm.   Isolated PACs were rare (<1.0%).   Isolated PVCs were rare (<1.0%).   No triggered and diary events. No malignant arrhythmias.  US RENAL  Result Date: 04/22/2023 CLINICAL DATA:  Acute renal injury EXAM: RENAL / URINARY TRACT ULTRASOUND COMPLETE COMPARISON:  Renal ultrasound dated 05/01/2022 FINDINGS: Right Kidney: Length = 10.3 cm, previously 9.4 cm Diffusely increased cortical echogenicity with diminished corticomedullary differentiation which can be seen with medical renal disease. No urinary tract dilation or shadowing calculi. The ureter is not seen. Left Kidney: Length = 10.6 cm, unchanged Diffusely increased cortical echogenicity with preserved corticomedullary differentiation which can be seen with medical renal disease. No urinary tract dilation or shadowing calculi. The ureter is not seen. Bladder: Circumferential mural thickening of the underdistended urinary  bladder with presumed catheter in-situ. Other: Incidentally seen cholelithiasis. IMPRESSION: 1. No urinary tract dilation or shadowing calculi. 2. Diffusely increased cortical echogenicity of the bilateral kidneys which can be seen with medical renal disease. 3. Circumferential mural thickening of the underdistended urinary bladder with presumed catheter in-situ. Findings may be related to underdistention or cystitis. Electronically Signed   By: Agustin Cree M.D.   On: 04/22/2023 08:24   VAS [place] LOWER EXTREMITY VENOUS (DVT) (ONLY [facility] & [facility])  Result Date: 04/21/2023  Lower Venous DVT Study Patient Name:  NAJAH MOALA  Date of Exam:   04/21/2023 Medical Rec #: [id number]        Accession #:    [id number] Date of Birth: 06-17-1951  Patient Gender: F Patient Age:   85 years Exam Location:  [facility] Procedure:      VAS [place] LOWER EXTREMITY VENOUS (DVT) Referring Phys: DAVID YAO --------------------------------------------------------------------------------  Indications: Swelling.  Risk Factors: None identified. Limitations: Poor ultrasound/tissue interface. Comparison Study: No prior studies. Performing Technologist: Chanda Busing RVT  Examination Guidelines: A complete evaluation includes B-mode imaging, spectral Doppler, color Doppler, and power Doppler as needed of all accessible portions of each vessel. Bilateral testing is considered an integral part of a complete examination. Limited examinations for reoccurring indications may be performed as noted. The reflux portion of the exam is performed with the patient in reverse Trendelenburg.  +---------+---------------+---------+-----------+----------+--------------+ RIGHT    CompressibilityPhasicitySpontaneityPropertiesThrombus Aging +---------+---------------+---------+-----------+----------+--------------+ CFV      Full           Yes      Yes                                  +---------+---------------+---------+-----------+----------+--------------+ SFJ      Full                                                        +---------+---------------+---------+-----------+----------+--------------+ FV Prox  Full                                                        +---------+---------------+---------+-----------+----------+--------------+ FV Mid   Full                                                        +---------+---------------+---------+-----------+----------+--------------+ FV Distal               Yes      Yes                                 +---------+---------------+---------+-----------+----------+--------------+ PFV      Full                                                        +---------+---------------+---------+-----------+----------+--------------+ POP      Full           Yes      Yes                                 +---------+---------------+---------+-----------+----------+--------------+ PTV      Full                                                        +---------+---------------+---------+-----------+----------+--------------+  PERO     Full                                                        +---------+---------------+---------+-----------+----------+--------------+   +----+---------------+---------+-----------+----------+--------------+ LEFTCompressibilityPhasicitySpontaneityPropertiesThrombus Aging +----+---------------+---------+-----------+----------+--------------+ CFV Full           Yes      Yes                                 +----+---------------+---------+-----------+----------+--------------+     Summary: RIGHT: - There is no evidence of deep vein thrombosis in the lower extremity. However, portions of this examination were limited- see technologist comments above.  - No cystic structure found in the popliteal fossa.  LEFT: - No evidence of common femoral vein obstruction.  *See table(s)  above for measurements and observations. Electronically signed by Coral Else MD on 04/21/2023 at 8:53:16 PM.    Final    DG Tibia/Fibula Right Port  Result Date: 04/21/2023 CLINICAL DATA:  Cellulitis. EXAM: PORTABLE RIGHT TIBIA AND FIBULA - 2 VIEW COMPARISON:  None Available. FINDINGS: There is no evidence of fracture or other focal bone lesions. Soft tissues are unremarkable. IMPRESSION: Negative. Electronically Signed   By: Lupita Raider M.D.   On: 04/21/2023 18:32   DG Femur Portable 1 View Right  Result Date: 04/21/2023 CLINICAL DATA:  Right leg cellulitis. EXAM: RIGHT FEMUR PORTABLE 1 VIEW COMPARISON:  None Available. FINDINGS: There is no evidence of fracture or other focal bone lesions. Soft tissues are unremarkable. IMPRESSION: Negative. Electronically Signed   By: Lupita Raider M.D.   On: 04/21/2023 18:31   DG Chest Port 1 View  Result Date: 04/21/2023 CLINICAL DATA:  Altered mental status. EXAM: PORTABLE CHEST 1 VIEW COMPARISON:  Apr 02, 2023. FINDINGS: Mild cardiomegaly is noted. Minimal bibasilar subsegmental atelectasis is noted. Minimal left pleural effusion is noted. Bony thorax is unremarkable. IMPRESSION: Minimal bibasilar subsegmental atelectasis with minimal left pleural effusion. Electronically Signed   By: Lupita Raider M.D.   On: 04/21/2023 18:29   US Abdomen Limited RUQ (LIVER/GB)  Result Date: 04/08/2023 CLINICAL DATA:  Cirrhosis EXAM: ULTRASOUND ABDOMEN LIMITED RIGHT UPPER QUADRANT COMPARISON:  Ultrasound abdomen 05/01/2022 FINDINGS: Gallbladder: Cholelithiasis. There is a 5 mm gallbladder polyp. Focal adenomyomatosis of the gallbladder fundus. Negative sonographic Murphy's sign. Common bile duct: Diameter: 3.5 mm Liver: Coarsened echogenicity and nodular contour. No focal hepatic lesion identified. Tips stent is visualized however not specifically interrogated. Portal vein is patent on color Doppler imaging with normal direction of blood flow towards the liver.  Other: None. IMPRESSION: 1. Cirrhotic morphology of the liver. No focal hepatic lesion identified. 2. Cholelithiasis. 3. 5 mm gallbladder polyp.  No imaging follow-up needed. 4. Focal adenomyomatosis of the gallbladder fundus. Electronically Signed   By: Annia Belt M.D.   On: 04/08/2023 12:34   DG Chest 2 View  Result Date: 04/08/2023 CLINICAL DATA:  72 year old female with pneumonia EXAM: CHEST - 2 VIEW COMPARISON:  02/09/2023 FINDINGS: Cardiomediastinal silhouette unchanged. Event recorder on the left chest wall, new from the prior. Bronchial wall thickening with vague reticular opacities throughout the lungs. No focal airspace disease. No pneumothorax or pleural effusion. Degenerative changes of the spine. No displaced fracture. Changes of prior tips right upper quadrant.  IMPRESSION: Bronchial wall thickening and some vague reticular opacities of the lungs may reflect bronchitis or other atypical infection. Negative for lobar pneumonia Electronically Signed   By: Gilmer Mor D.O.   On: 04/08/2023 08:45    Microbiology: Results for orders placed or performed during the hospital encounter of 04/21/23  Blood culture (routine x 2)     Status: Abnormal   Collection Time: 04/21/23  5:50 PM   Specimen: BLOOD RIGHT HAND  Result Value Ref Range Status   Specimen Description   Final    BLOOD RIGHT HAND Performed at [facility], [address] [place], [place] [address]    Special Requests   Final    BOTTLES DRAWN AEROBIC AND ANAEROBIC Blood Culture results may not be optimal due to an inadequate volume of blood received in culture bottles Performed at [facility], [address] [place], [place] [address]    Culture  Setup Time   Final    GRAM POSITIVE COCCI IN CHAINS IN BOTH AEROBIC AND ANAEROBIC BOTTLES CRITICAL VALUE NOTED.  VALUE IS CONSISTENT WITH PREVIOUSLY REPORTED AND CALLED VALUE.    Culture (A)  Final    GROUP B  STREP(S.AGALACTIAE)ISOLATED SUSCEPTIBILITIES PERFORMED ON PREVIOUS CULTURE WITHIN THE LAST 5 DAYS. Performed at [facility], [address] [place], [place] [address]    Report Status 04/24/2023 FINAL  Final  Blood culture (routine x 2)     Status: Abnormal   Collection Time: 04/21/23  5:53 PM   Specimen: BLOOD LEFT ARM  Result Value Ref Range Status   Specimen Description   Final    BLOOD LEFT ARM Performed at [facility], [address], [place], [place] [address]    Special Requests   Final    BOTTLES DRAWN AEROBIC AND ANAEROBIC Blood Culture adequate volume Performed at [facility], [address], [place], [place] [address]    Culture  Setup Time   Final    GRAM POSITIVE COCCI IN CHAINS IN BOTH AEROBIC AND ANAEROBIC BOTTLES CRITICAL RESULT CALLED TO, READ BACK BY AND VERIFIED WITH: PHARMD JIGNA J [id number] SM Performed at [facility], [address], [place], [place] [address]    Culture GROUP B STREP(S.AGALACTIAE)ISOLATED (A)  Final   Report Status 04/24/2023 FINAL  Final   Organism ID, Bacteria GROUP B STREP(S.AGALACTIAE)ISOLATED  Final      Susceptibility   Group b strep(s.agalactiae)isolated - MIC*    CLINDAMYCIN >=1 RESISTANT Resistant     AMPICILLIN <=0.25 SENSITIVE Sensitive     ERYTHROMYCIN >=8 RESISTANT Resistant     VANCOMYCIN 0.5 SENSITIVE Sensitive     CEFTRIAXONE <=0.12 SENSITIVE Sensitive     LEVOFLOXACIN 1 SENSITIVE Sensitive     PENICILLIN <=0.06 SENSITIVE Sensitive     * GROUP B STREP(S.AGALACTIAE)ISOLATED  Blood Culture ID Panel (Reflexed)     Status: Abnormal   Collection Time: 04/21/23  5:53 PM  Result Value Ref Range Status   Enterococcus faecalis NOT DETECTED NOT DETECTED Final   Enterococcus Faecium NOT DETECTED NOT DETECTED Final   Listeria monocytogenes NOT DETECTED NOT DETECTED Final   Staphylococcus species NOT DETECTED NOT DETECTED Final   Staphylococcus aureus (BCID) NOT DETECTED NOT  DETECTED Final   Staphylococcus epidermidis NOT DETECTED NOT DETECTED Final   Staphylococcus lugdunensis NOT DETECTED NOT DETECTED Final   Streptococcus species DETECTED (A) NOT DETECTED Final    Comment: CRITICAL RESULT CALLED TO, READ BACK BY  AND VERIFIED WITH:  PHARMD JIGNA J [id number] SM    Streptococcus agalactiae DETECTED (A) NOT DETECTED Final    Comment: CRITICAL RESULT CALLED TO, READ BACK BY AND VERIFIED WITH:  PHARMD JIGNA J [id number] SM    Streptococcus pneumoniae NOT DETECTED NOT DETECTED Final   Streptococcus pyogenes NOT DETECTED NOT DETECTED Final   A.calcoaceticus-baumannii NOT DETECTED NOT DETECTED Final   Bacteroides fragilis NOT DETECTED NOT DETECTED Final   Enterobacterales NOT DETECTED NOT DETECTED Final   Enterobacter cloacae complex NOT DETECTED NOT DETECTED Final   Escherichia coli NOT DETECTED NOT DETECTED Final   Klebsiella aerogenes NOT DETECTED NOT DETECTED Final   Klebsiella oxytoca NOT DETECTED NOT DETECTED Final   Klebsiella pneumoniae NOT DETECTED NOT DETECTED Final   Proteus species NOT DETECTED NOT DETECTED Final   Salmonella species NOT DETECTED NOT DETECTED Final   Serratia marcescens NOT DETECTED NOT DETECTED Final   Haemophilus influenzae NOT DETECTED NOT DETECTED Final   Neisseria meningitidis NOT DETECTED NOT DETECTED Final   Pseudomonas aeruginosa NOT DETECTED NOT DETECTED Final   Stenotrophomonas maltophilia NOT DETECTED NOT DETECTED Final   Candida albicans NOT DETECTED NOT DETECTED Final   Candida auris NOT DETECTED NOT DETECTED Final   Candida glabrata NOT DETECTED NOT DETECTED Final   Candida krusei NOT DETECTED NOT DETECTED Final   Candida parapsilosis NOT DETECTED NOT DETECTED Final   Candida tropicalis NOT DETECTED NOT DETECTED Final   Cryptococcus neoformans/gattii NOT DETECTED NOT DETECTED Final    Comment: Performed at [facility], [address], [place], [place] [address]  Urine Culture     Status: Abnormal    Collection Time: 04/21/23  9:23 PM   Specimen: Urine, Random  Result Value Ref Range Status   Specimen Description   Final    URINE, RANDOM Performed at [facility], [address] [place], [place] [address]    Special Requests   Final    NONE Reflexed from [id number] Performed at [facility], [address] [place], [place] [address]    Culture (A)  Final    >=100,000 COLONIES/mL GROUP B STREP(S.AGALACTIAE)ISOLATED TESTING AGAINST S. AGALACTIAE NOT ROUTINELY PERFORMED DUE TO PREDICTABILITY OF AMP/PEN/VAN SUSCEPTIBILITY. Performed at [facility], [address] [place], [place] [address]    Report Status 04/22/2023 FINAL  Final  Culture, blood (Routine X 2) w Reflex to ID Panel     Status: None (Preliminary result)   Collection Time: 04/23/23  3:21 AM   Specimen: BLOOD  Result Value Ref Range Status   Specimen Description   Final    BLOOD BLOOD RIGHT HAND Performed at [facility], [address] [place], [place] [address]    Special Requests   Final    BOTTLES DRAWN AEROBIC AND ANAEROBIC Blood Culture adequate volume Performed at [facility], [address], [place], [place] [address]    Culture   Final    NO GROWTH 3 DAYS Performed at [facility], [address] [place], [place] [address]    Report Status PENDING  Incomplete  Culture, blood (Routine X 2) w Reflex to ID Panel     Status: None (Preliminary result)   Collection Time: 04/23/23  3:22 AM   Specimen: BLOOD  Result Value Ref Range Status   Specimen Description   Final    BLOOD BLOOD LEFT ARM Performed at [facility], [address] [place],  [place] [address]    Special Requests   Final    BOTTLES DRAWN AEROBIC AND ANAEROBIC Blood Culture adequate volume Performed at [facility], [address] [place], [place] [address]    Culture   Final    NO GROWTH 3 DAYS Performed at  [facility], [address] [place], [place] [address]    Report Status PENDING  Incomplete    Labs: CBC: Recent Labs  Lab 04/21/23 1750 04/22/23 0315 04/23/23 0321 04/24/23 0247 04/25/23 0410 04/26/23 0423 04/27/23 0437  WBC 10.3   < > 16.4* 16.6* 11.2* 6.0 5.4  NEUTROABS 9.7*  --   --   --  9.7* 4.1 3.0  HGB 9.9*   < > 9.4* 9.3* 9.5* 9.9* 10.2*  HCT 30.1*   < > 27.9* 28.6* 29.0* 30.2* 30.7*  MCV 106.4*   < > 105.3* 105.5* 104.7* 106.0* 103.4*  PLT 104*   < > 80* 87* 90* 109* 156   < > = values in this interval not displayed.   Basic Metabolic Panel: Recent Labs  Lab 04/22/23 0315 04/23/23 0321 04/24/23 0247 04/25/23 0410 04/26/23 0423 04/27/23 0437  NA 131* 134* 136 135 135 138  K 4.7 5.0 4.8 4.2 3.9 3.5  CL 104 106 108 109 110 107  CO2 18* 20* 19* 21* 19* 25  GLUCOSE 103* 104* 92 111* 94 92  BUN 29* 32* 38* 37* 30* 27*  CREATININE 1.33* 1.14* 1.06* 0.94 0.73 0.73  CALCIUM 8.5* 9.0 9.2 8.8* 8.6* 8.3*  MG 1.2* 2.4 2.2 2.0 1.7 1.4*  PHOS 4.0 4.2 3.5 2.8 2.8  --    Liver Function Tests: Recent Labs  Lab 04/21/23 1750 04/22/23 0315 04/25/23 0410 04/26/23 0423 04/27/23 0437  AST 57* 56* 85* 74* 66*  ALT 43 41 37 31 28  ALKPHOS 153* 106 157* 183* 213*  BILITOT 2.0* 3.0* 0.9 0.9 1.0  PROT 6.0* 6.4* 6.3* 5.8* 5.3*  ALBUMIN 2.7* 3.0* 2.8* 2.5* 2.3*   CBG: No results for input(s): "GLUCAP" in the last 168 hours.  Discharge time spent: greater than 30 minutes.  Signed: Marinda Elk, MD [facility] 04/27/2023

## 2023-04-27 NOTE — Progress Notes (Signed)
Mobility Specialist - Progress Note   04/27/23 1445  Mobility  Activity Ambulated with assistance to bathroom  Level of Assistance Standby assist, set-up cues, supervision of patient - no hands on  Assistive Device Front wheel walker  Distance Ambulated (ft) 10 ft  Range of Motion/Exercises Active  Activity Response Tolerated well  Mobility Referral Yes  $Mobility charge 1 Mobility  Mobility Specialist Start Time (ACUTE ONLY) 1425  Mobility Specialist Stop Time (ACUTE ONLY) 1445  Mobility Specialist Time Calculation (min) (ACUTE ONLY) 20 min   Pt was found on recliner chair wanting assistance to the bathroom. Had no complaints during session and at EOS returned to bed with call bell and necessities in reach. Husband in room.  Billey Chang Mobility Specialist

## 2023-04-28 ENCOUNTER — Ambulatory Visit: Payer: Medicare Other | Attending: Nurse Practitioner

## 2023-04-28 DIAGNOSIS — R2681 Unsteadiness on feet: Secondary | ICD-10-CM | POA: Diagnosis not present

## 2023-04-28 DIAGNOSIS — M25632 Stiffness of left wrist, not elsewhere classified: Secondary | ICD-10-CM | POA: Insufficient documentation

## 2023-04-28 DIAGNOSIS — R5381 Other malaise: Secondary | ICD-10-CM | POA: Diagnosis not present

## 2023-04-28 DIAGNOSIS — R29898 Other symptoms and signs involving the musculoskeletal system: Secondary | ICD-10-CM | POA: Diagnosis not present

## 2023-04-28 DIAGNOSIS — R278 Other lack of coordination: Secondary | ICD-10-CM | POA: Insufficient documentation

## 2023-04-28 DIAGNOSIS — M6281 Muscle weakness (generalized): Secondary | ICD-10-CM | POA: Diagnosis not present

## 2023-04-28 LAB — CULTURE, BLOOD (ROUTINE X 2)
Culture: NO GROWTH
Culture: NO GROWTH
Special Requests: ADEQUATE

## 2023-04-28 NOTE — Therapy (Signed)
OUTPATIENT PHYSICAL THERAPY TREATMENT NOTE   Patient Name: [name] MRN: [id number] DOB:09/01/1951, 72 y.o., female Today's Date: 04/28/2023  PCP: Eden Emms, NP  REFERRING PROVIDER: Eden Emms, NP   END OF SESSION:   PT End of Session - 04/28/23 1228     Visit Number 6    Number of Visits 12    Date for PT Re-Evaluation 06/01/23    Authorization Type UHC MCR    PT Start Time 1220    PT Stop Time 1300    PT Time Calculation (min) 40 min    Activity Tolerance Patient tolerated treatment well    Behavior During Therapy [facility] for tasks assessed/performed                Past Medical History:  Diagnosis Date   Acute urinary retention 05/04/2022   Allergy 2006 ?   Contrast dye   Arthritis 2016 ??   Knees and thumb   Cancer (HCC)    cecum   Cataract 2021   Surgery scheduled July 2023   Colon cancer [facility]) 2003   Elevated liver function tests    Esophageal varices (HCC)    Heart murmur On file   Hemorrhage of gastrointestinal tract 05/04/2011   Hypertension 2021   Hypothyroidism    Iron deficiency anemia    Liver disease    chemotherapy complication, per pt, shunts placed to bypass liver   Malignant neoplasm of cecum (HCC)    Portal hypertension (HCC)    Skin cancer 2019   Splenomegaly    Past Surgical History:  Procedure Laterality Date   COLON SURGERY  2004   Cancer   COSMETIC SURGERY  2021   Skin cancer   ESOPHAGEAL VARICE LIGATION     EYE SURGERY     HEMICOLECTOMY  01/08/2003   IR RADIOLOGIST EVAL & MGMT  12/20/2020   IR RADIOLOGIST EVAL & MGMT  05/29/2021   LIVER SURGERY     shunts placed after chemo complication   SKIN FULL THICKNESS GRAFT N/A 09/12/2019   Procedure: debridement and FTSG to the nose from left upper arm;  Surgeon: Allena Napoleon, MD;  Location: [facility];  Service: Plastics;  Laterality: N/A;  2 hours, please   TIPS PROCEDURE     Patient Active Problem List   Diagnosis Date Noted   Sepsis due to  group B Streptococcus with acute renal failure and septic shock (HCC) 04/24/2023   Septic shock (HCC) 04/21/2023   Adrenal insufficiency (HCC) 03/19/2023   Protein-calorie malnutrition, severe (HCC) 02/26/2023   Acute hepatic encephalopathy (HCC) 02/25/2023   Weight loss 02/24/2023   Bowel habit changes 02/24/2023   Generalized abdominal pain 02/24/2023   Hospital discharge follow-up 02/24/2023   Weakness 01/04/2023   Frequent UTI 05/18/2022   Anemia 05/18/2022   AKI (acute kidney injury) (HCC) 04/30/2022   Tachycardia-bradycardia syndrome (HCC) 02/05/2022   Hepatic encephalopathy (HCC) 02/03/2022   Basal cell carcinoma (BCC) 05/06/2021   Cecal cancer (HCC) 05/06/2021   Hypertension 05/06/2021   COVID-19 vaccination declined 01/30/2021   Decompensated liver cirrhosis with portal HTN and gastric varices 12/04/2020   Essential hypertension 08/12/2020   Murmur, cardiac 03/19/2020   Hypothyroidism 06/30/2019   History of basal cell cancer 06/30/2019   Hx of colon cancer, stage III 11/30/2011   Esophageal varices (HCC) 06/12/2010   Portal hypertension (HCC) 01/23/2008    REFERRING DIAG: R53.1 (ICD-10-CM) - Weakness   THERAPY DIAG:  Physical deconditioning -  Plan: PT plan of care cert/re-cert  Unsteadiness on feet - Plan: PT plan of care cert/re-cert  Muscle weakness (generalized) - Plan: PT plan of care cert/re-cert  Rationale for Evaluation and Treatment Rehabilitation  PERTINENT HISTORY:  See PMH  PRECAUTIONS: Fall  SUBJECTIVE:                                                                                                                                                                                      SUBJECTIVE STATEMENT:  First f/u session since hospital admission for infection and AKI. Has reverted to RW due to weakness and balance issues.  Also describing low back pain which she attributes to deconditioned state.  PAIN:  Are you having pain? No   OBJECTIVE:  (objective measures completed at initial evaluation unless otherwise dated)   DIAGNOSTIC FINDINGS: none   PATIENT SURVEYS:  FOTO 61(68 predicted); 04/28/23 45%   MUSCLE LENGTH: Not tested   POSTURE: rounded shoulders, forward head, and increased thoracic kyphosis   PALPATION: Deferred based on Dx   LOWER EXTREMITY ROM: WFL for transfers and gait   Active ROM Right eval Left eval  Hip flexion      Hip extension      Hip abduction      Hip adduction      Hip internal rotation      Hip external rotation      Knee flexion      Knee extension      Ankle dorsiflexion      Ankle plantarflexion      Ankle inversion      Ankle eversion       (Blank rows = not tested)   LOWER EXTREMITY MMT:   MMT Right eval Left eval R 04/28/23 L 6//24  Hip flexion 4- 4- 3+ 3+  Hip extension 4- 4- 3+ 3+  Hip abduction 4- 4- 3+ 3+  Hip adduction        Hip internal rotation        Hip external rotation        Knee flexion 4- 4- 3+ 3+  Knee extension 4- 4- 3+ 3+  Ankle dorsiflexion        Ankle plantarflexion 4- 4- 3 3  Ankle inversion        Ankle eversion         (Blank rows = not tested)   LOWER EXTREMITY SPECIAL TESTS:  Deferred    FUNCTIONAL TESTS:  30 seconds chair stand test 7 stands with UE assist 04/28/23 4 stands with UE support   GAIT: Distance walked: 89ftx2 Assistive device utilized: Single point cane; 04/28/23 RW Level of  assistance: SBA Comments: slow cadence,    Vital signs:  O2 sats 96% HR 58   TODAY'S TREATMENT:      [facility] Adult PT Treatment:                                                DATE: 04/28/23 Reasessment  [facility] Adult PT Treatment:                                                DATE: 04/20/23 Therapeutic Exercise: Nustep level 2 x 5 min Supine hip fallouts GTB 10x B, 10/10 unilaterally SLR 10/10 Supine march 10/10 Supine heel slides 10/10 SAQs 10/10 STS 5x emphasized eccentric component       [facility] Adult PT Treatment:                                                 DATE: 04/15/23 Therapeutic Exercise: Nustep level 3 x 4 mins while taking subjective LAQ 2x10 BIL  Seated knee flexion RTB x10 BIL STS with OH press 3x5 - no UE  Seated hip adduction ball squeeze 5" hold 2x10 Standing hip abd/ext x 10 each - B UE support Seated clamshell 2x15 RTB Seated march RTB x 60"   [facility] Adult PT Treatment:                                                DATE: 04/13/23 Therapeutic Exercise: Nustep level 4 x 5 mins LAQ 2x10 BIL Seated knee flexion RTB x10 BIL Seated hip adduction ball squeeze 5" hold x10 Standing hip abduction with UE support x10 BIL STS with OH reach x10 Forward stepping with contralateral UE reach only with RUE bec of wrist brace on LUE Standing heel raise with UE support x10 Neuromuscular re-ed: Semi tandem stance x30" BIL FT stance x30" w/ looking up/down/L/R           [facility] Adult PT Treatment:                                                DATE: 04/08/23 Therapeutic Exercise: Nustep level 4 x 5 mins LAQ x10, x5 BIL Seated knee flexion RTB x10 BIL STS x10 STS with OH reach x5 Forward stepping with contralateral UE reach only with RUE bec of wrist brace on LUE Standing heel raise with UE support x10 Neuromuscular re-ed: Semi tandem stance x30" BIL FT stance x30" w/look up/down   PATIENT EDUCATION:  Education details: Discussed eval findings, rehab rationale and POC and patient is in agreement  Person educated: Patient Education method: Explanation Education comprehension: verbalized understanding and needs further education   HOME EXERCISE PROGRAM: Access Code: [email or web address] URL: [email or web address] Date: 04/06/2023 Prepared by: Gustavus Bryant   Exercises - Sit to Stand with Armchair  - 2 x daily - 5 x weekly -  1 sets - 5 reps - Standing Heel Raise with Support  - 2 x daily - 5 x weekly - 1-2 sets - 10 reps   ASSESSMENT:   CLINICAL IMPRESSION: Patient returns to PT following hospital stay related  to infection.  Spent 6 days in inpatient and presents with deconditioning as well as newly developed low back pain.  Presents with bradycardia and now has a cardiologist as well as endocrinologist to address ongoing issues.  Recommend decreasing to 1x/week to allow patient to regain prior level of function and attend f/u sessions with new physicians.    OBJECTIVE IMPAIRMENTS: Abnormal gait, decreased activity tolerance, decreased balance, decreased endurance, decreased mobility, difficulty walking, decreased strength, and postural dysfunction.    ACTIVITY LIMITATIONS: carrying, lifting, squatting, stairs, and locomotion level   PARTICIPATION LIMITATIONS: meal prep, cleaning, laundry, shopping, community activity, and yard work   PERSONAL FACTORS: Fitness, Past/current experiences, Time since onset of injury/illness/exacerbation, and 1 comorbidity: hepatic cirrhosis   are also affecting patient's functional outcome.    REHAB POTENTIAL: Good   CLINICAL DECISION MAKING: Stable/uncomplicated   EVALUATION COMPLEXITY: Low     GOALS: Goals reviewed with patient? No   SHORT TERM GOALS: Target date: 05/27/2023   Patient to demonstrate independence in HEP  Baseline: [id number] Goal status: INITIAL   2.  245ft ambulation with SPC and S Baseline: 8ft with SPC and SBA; 46ft with RW and SBA Goal status: INITIAL       LONG TERM GOALS: Target date: 07/18/2023     Increase BLE strength to 4/5 Baseline:  MMT Right eval Left eval R 04/28/23 L 6//24  Hip flexion 4- 4- 3+ 3+  Hip extension 4- 4- 3+ 3+  Hip abduction 4- 4- 3+ 3+  Hip adduction        Hip internal rotation        Hip external rotation        Knee flexion 4- 4- 3+ 3+  Knee extension 4- 4- 3+ 3+  Ankle dorsiflexion        Ankle plantarflexion 4- 4- 3 3    Goal status: INITIAL   2.  Increase BERG score to 50 Baseline: 44 Goal status: INITIAL   3.  Increase 30s chair stand test to 10 reps w/UE support Baseline: 7 stands  with UE support; 04/28/23 4 stands with UE support Goal status: INITIAL   4.  Patient to negotiate 16 steps with single rail, cane and appropriate step pattern Baseline: TBD Goal status: INITIAL  5.  Increase FOTO score to 68% Baseline: 45% Goal status: INITIAL         PLAN:   PT FREQUENCY: 1-2x/week   PT DURATION: 8 weeks   PLANNED INTERVENTIONS: Therapeutic exercises, Therapeutic activity, Neuromuscular re-education, Balance training, Gait training, Patient/Family education, Self Care, Joint mobilization, Stair training, DME instructions, Manual therapy, and Re-evaluation   PLAN FOR NEXT SESSION: HEP review and update, manual techniques as appropriate, aerobic tasks, ROM and flexibility activities, strengthening and PREs, TPDN, gait and balance training as needed       Hildred Laser, PT 04/28/2023, 1:19 PM

## 2023-04-29 ENCOUNTER — Ambulatory Visit: Payer: Medicare Other

## 2023-05-04 ENCOUNTER — Ambulatory Visit: Payer: Medicare Other

## 2023-05-04 DIAGNOSIS — R29898 Other symptoms and signs involving the musculoskeletal system: Secondary | ICD-10-CM | POA: Diagnosis not present

## 2023-05-04 DIAGNOSIS — R2681 Unsteadiness on feet: Secondary | ICD-10-CM | POA: Diagnosis not present

## 2023-05-04 DIAGNOSIS — R5381 Other malaise: Secondary | ICD-10-CM | POA: Diagnosis not present

## 2023-05-04 DIAGNOSIS — M6281 Muscle weakness (generalized): Secondary | ICD-10-CM

## 2023-05-04 DIAGNOSIS — M25632 Stiffness of left wrist, not elsewhere classified: Secondary | ICD-10-CM | POA: Diagnosis not present

## 2023-05-04 DIAGNOSIS — R278 Other lack of coordination: Secondary | ICD-10-CM | POA: Diagnosis not present

## 2023-05-04 NOTE — Therapy (Signed)
OUTPATIENT PHYSICAL THERAPY TREATMENT NOTE   Patient Name: [name] MRN: [id number] DOB:12-Sep-1951, 72 y.o., female Today's Date: 05/04/2023  PCP: Eden Emms, NP  REFERRING PROVIDER: Eden Emms, NP   END OF SESSION:   PT End of Session - 05/04/23 1124     Visit Number 7    Number of Visits 12    Date for PT Re-Evaluation 06/01/23    Authorization Type [employer] MCR    PT Start Time 1130    PT Stop Time 1215    PT Time Calculation (min) 45 min    Activity Tolerance Patient limited by pain;Treatment limited secondary to medical complications (Comment)    Behavior During Therapy [facility] for tasks assessed/performed              Past Medical History:  Diagnosis Date   Acute urinary retention 05/04/2022   Allergy 2006 ?   Contrast dye   Arthritis 2016 ??   Knees and thumb   Cancer (HCC)    cecum   Cataract 2021   Surgery scheduled July 2023   Colon cancer [facility]) 2003   Elevated liver function tests    Esophageal varices (HCC)    Heart murmur On file   Hemorrhage of gastrointestinal tract 05/04/2011   Hypertension 2021   Hypothyroidism    Iron deficiency anemia    Liver disease    chemotherapy complication, per pt, shunts placed to bypass liver   Malignant neoplasm of cecum (HCC)    Portal hypertension (HCC)    Skin cancer 2019   Splenomegaly    Past Surgical History:  Procedure Laterality Date   COLON SURGERY  2004   Cancer   COSMETIC SURGERY  2021   Skin cancer   ESOPHAGEAL VARICE LIGATION     EYE SURGERY     HEMICOLECTOMY  01/08/2003   IR RADIOLOGIST EVAL & MGMT  12/20/2020   IR RADIOLOGIST EVAL & MGMT  05/29/2021   LIVER SURGERY     shunts placed after chemo complication   SKIN FULL THICKNESS GRAFT N/A 09/12/2019   Procedure: debridement and FTSG to the nose from left upper arm;  Surgeon: Allena Napoleon, MD;  Location: [facility];  Service: Plastics;  Laterality: N/A;  2 hours, please   TIPS PROCEDURE     Patient Active Problem  List   Diagnosis Date Noted   Sepsis due to group B Streptococcus with acute renal failure and septic shock (HCC) 04/24/2023   Septic shock (HCC) 04/21/2023   Adrenal insufficiency (HCC) 03/19/2023   Protein-calorie malnutrition, severe (HCC) 02/26/2023   Acute hepatic encephalopathy (HCC) 02/25/2023   Weight loss 02/24/2023   Bowel habit changes 02/24/2023   Generalized abdominal pain 02/24/2023   Hospital discharge follow-up 02/24/2023   Weakness 01/04/2023   Frequent UTI 05/18/2022   Anemia 05/18/2022   AKI (acute kidney injury) (HCC) 04/30/2022   Tachycardia-bradycardia syndrome (HCC) 02/05/2022   Hepatic encephalopathy (HCC) 02/03/2022   Basal cell carcinoma (BCC) 05/06/2021   Cecal cancer (HCC) 05/06/2021   Hypertension 05/06/2021   COVID-19 vaccination declined 01/30/2021   Decompensated liver cirrhosis with portal HTN and gastric varices 12/04/2020   Essential hypertension 08/12/2020   Murmur, cardiac 03/19/2020   Hypothyroidism 06/30/2019   History of basal cell cancer 06/30/2019   Hx of colon cancer, stage III 11/30/2011   Esophageal varices (HCC) 06/12/2010   Portal hypertension (HCC) 01/23/2008    REFERRING DIAG: R53.1 (ICD-10-CM) - Weakness   THERAPY DIAG:  Physical deconditioning  Unsteadiness on feet  Muscle weakness (generalized)  Rationale for Evaluation and Treatment Rehabilitation  PERTINENT HISTORY:  See PMH  PRECAUTIONS: Fall  SUBJECTIVE:                                                                                                                                                                                      SUBJECTIVE STATEMENT:  Patient reports continued soreness in her lower back. She has been ambulating with cane over the past few days which has been going well. She does not report any new falls. Husband reports they have been "out and about" more which is going well.   PAIN:  Are you having pain? No   OBJECTIVE: (objective  measures completed at initial evaluation unless otherwise dated)   DIAGNOSTIC FINDINGS: none   PATIENT SURVEYS:  FOTO 61(68 predicted); 04/28/23 45%   MUSCLE LENGTH: Not tested   POSTURE: rounded shoulders, forward head, and increased thoracic kyphosis   PALPATION: Deferred based on Dx   LOWER EXTREMITY ROM: WFL for transfers and gait   Active ROM Right eval Left eval  Hip flexion      Hip extension      Hip abduction      Hip adduction      Hip internal rotation      Hip external rotation      Knee flexion      Knee extension      Ankle dorsiflexion      Ankle plantarflexion      Ankle inversion      Ankle eversion       (Blank rows = not tested)   LOWER EXTREMITY MMT:   MMT Right eval Left eval R 04/28/23 L 6//24  Hip flexion 4- 4- 3+ 3+  Hip extension 4- 4- 3+ 3+  Hip abduction 4- 4- 3+ 3+  Hip adduction        Hip internal rotation        Hip external rotation        Knee flexion 4- 4- 3+ 3+  Knee extension 4- 4- 3+ 3+  Ankle dorsiflexion        Ankle plantarflexion 4- 4- 3 3  Ankle inversion        Ankle eversion         (Blank rows = not tested)   LOWER EXTREMITY SPECIAL TESTS:  Deferred    FUNCTIONAL TESTS:  30 seconds chair stand test 7 stands with UE assist 04/28/23 4 stands with UE support   GAIT: Distance walked: 46ftx2 Assistive device utilized: Single point cane; 04/28/23 RW Level of assistance: SBA Comments: slow cadence,  Vital signs:  O2 sats 96% HR 58   TODAY'S TREATMENT:      [facility] Adult PT Treatment:                                                DATE: 05/04/23 Therapeutic Exercise: HR: 53-59 bpm, O2: 95-97% throughout session Seated marching 2x30" Seated LAQ 2x10 BIL Seated hip adduction squeeze 5" hold x10 Supine hip adduction squeeze 5" hold x10 Supine hip fallouts RTB 2x10 B Supine march RTB 2x30" Supine heel slides 5/5 SAQs 10/10  [facility] Adult PT Treatment:                                                DATE:  04/28/23 Reasessment  [facility] Adult PT Treatment:                                                DATE: 04/20/23 Therapeutic Exercise: Nustep level 2 x 5 min Supine hip fallouts GTB 10x B, 10/10 unilaterally SLR 10/10 Supine march 10/10 Supine heel slides 10/10 SAQs 10/10 STS 5x emphasized eccentric component       [facility] Adult PT Treatment:                                                DATE: 04/15/23 Therapeutic Exercise: Nustep level 3 x 4 mins while taking subjective LAQ 2x10 BIL  Seated knee flexion RTB x10 BIL STS with OH press 3x5 - no UE  Seated hip adduction ball squeeze 5" hold 2x10 Standing hip abd/ext x 10 each - B UE support Seated clamshell 2x15 RTB Seated march RTB x 60"     PATIENT EDUCATION:  Education details: Discussed eval findings, rehab rationale and POC and patient is in agreement  Person educated: Patient Education method: Explanation Education comprehension: verbalized understanding and needs further education   HOME EXERCISE PROGRAM: Access Code: [email or web address] URL: [email or web address] Date: 04/06/2023 Prepared by: Gustavus Bryant   Exercises - Sit to Stand with Armchair  - 2 x daily - 5 x weekly - 1 sets - 5 reps - Standing Heel Raise with Support  - 2 x daily - 5 x weekly - 1-2 sets - 10 reps   ASSESSMENT:   CLINICAL IMPRESSION:  Patient presents to PT reporting lower back pain that has been chronic, does not endorse any new falls. She has been ambulating with the [facility] over the past few days with no issues. Her HR remains low throughout session 53-59 bpm, but patient and husband report she recently got finished wearing a cardiac monitor for 2 weeks and the cardiologist was not concerned about her low HR. She was limited throughout session by lower back pain and general fatigue/deconditioning from recent hospital stay. She required modA for supine to sit due to lower back pain. Patient continues to benefit from skilled PT services and should be  progressed as able to improve functional independence.     OBJECTIVE IMPAIRMENTS: Abnormal  gait, decreased activity tolerance, decreased balance, decreased endurance, decreased mobility, difficulty walking, decreased strength, and postural dysfunction.    ACTIVITY LIMITATIONS: carrying, lifting, squatting, stairs, and locomotion level   PARTICIPATION LIMITATIONS: meal prep, cleaning, laundry, shopping, community activity, and yard work   PERSONAL FACTORS: Fitness, Past/current experiences, Time since onset of injury/illness/exacerbation, and 1 comorbidity: hepatic cirrhosis   are also affecting patient's functional outcome.    REHAB POTENTIAL: Good   CLINICAL DECISION MAKING: Stable/uncomplicated   EVALUATION COMPLEXITY: Low     GOALS: Goals reviewed with patient? No   SHORT TERM GOALS: Target date: 05/27/2023   Patient to demonstrate independence in HEP  Baseline: [id number] Goal status: INITIAL   2.  278ft ambulation with SPC and S Baseline: 40ft with SPC and SBA; 25ft with RW and SBA Goal status: INITIAL       LONG TERM GOALS: Target date: 07/18/2023     Increase BLE strength to 4/5 Baseline:  MMT Right eval Left eval R 04/28/23 L 6//24  Hip flexion 4- 4- 3+ 3+  Hip extension 4- 4- 3+ 3+  Hip abduction 4- 4- 3+ 3+  Hip adduction        Hip internal rotation        Hip external rotation        Knee flexion 4- 4- 3+ 3+  Knee extension 4- 4- 3+ 3+  Ankle dorsiflexion        Ankle plantarflexion 4- 4- 3 3    Goal status: INITIAL   2.  Increase BERG score to 50 Baseline: 44 Goal status: INITIAL   3.  Increase 30s chair stand test to 10 reps w/UE support Baseline: 7 stands with UE support; 04/28/23 4 stands with UE support Goal status: INITIAL   4.  Patient to negotiate 16 steps with single rail, cane and appropriate step pattern Baseline: TBD Goal status: INITIAL  5.  Increase FOTO score to 68% Baseline: 45% Goal status: INITIAL         PLAN:   PT  FREQUENCY: 1-2x/week   PT DURATION: 8 weeks   PLANNED INTERVENTIONS: Therapeutic exercises, Therapeutic activity, Neuromuscular re-education, Balance training, Gait training, Patient/Family education, Self Care, Joint mobilization, Stair training, DME instructions, Manual therapy, and Re-evaluation   PLAN FOR NEXT SESSION: HEP review and update, manual techniques as appropriate, aerobic tasks, ROM and flexibility activities, strengthening and PREs, TPDN, gait and balance training as needed       Berta Minor, PTA 05/04/2023, 12:24 PM

## 2023-05-05 ENCOUNTER — Other Ambulatory Visit (INDEPENDENT_AMBULATORY_CARE_PROVIDER_SITE_OTHER): Payer: Medicare Other

## 2023-05-05 ENCOUNTER — Other Ambulatory Visit: Payer: Self-pay

## 2023-05-05 DIAGNOSIS — E2740 Unspecified adrenocortical insufficiency: Secondary | ICD-10-CM

## 2023-05-05 LAB — CORTISOL: Cortisol, Plasma: 13.9 ug/dL

## 2023-05-05 LAB — BASIC METABOLIC PANEL
BUN: 13 mg/dL (ref 6–23)
CO2: 28 mEq/L (ref 19–32)
Calcium: 9.1 mg/dL (ref 8.4–10.5)
Chloride: 102 mEq/L (ref 96–112)
Creatinine, Ser: 0.63 mg/dL (ref 0.40–1.20)
GFR: 88.93 mL/min (ref >60.00)
Glucose, Bld: 88 mg/dL (ref 70–99)
Potassium: 3.9 mEq/L (ref 3.5–5.1)
Sodium: 139 mEq/L (ref 135–145)

## 2023-05-07 ENCOUNTER — Ambulatory Visit (INDEPENDENT_AMBULATORY_CARE_PROVIDER_SITE_OTHER): Payer: Medicare Other | Admitting: Nurse Practitioner

## 2023-05-07 ENCOUNTER — Encounter: Payer: Self-pay | Admitting: Nurse Practitioner

## 2023-05-07 VITALS — BP 118/62 | HR 55 | Temp 97.8°F | Resp 16 | Ht 64.0 in | Wt 149.0 lb

## 2023-05-07 DIAGNOSIS — R001 Bradycardia, unspecified: Secondary | ICD-10-CM | POA: Insufficient documentation

## 2023-05-07 DIAGNOSIS — Z09 Encounter for follow-up examination after completed treatment for conditions other than malignant neoplasm: Secondary | ICD-10-CM

## 2023-05-07 NOTE — Patient Instructions (Signed)
Nice to see you today I want to see you in a month, sooner if you need me Continue with physical therapy  Finish the antibiotics as ordered

## 2023-05-07 NOTE — Assessment & Plan Note (Signed)
PT was concerned the patient's heart rate.  Patient did have an echocardiogram that showed no malignant arrhythmias or pauses.  Patient is been asymptomatic.  She can continue physical therapy as prescribed as long as her stamina and endurance can handle it.

## 2023-05-07 NOTE — Assessment & Plan Note (Signed)
Reviewed recent hospitalization notes labs and recent mission for discharge.

## 2023-05-07 NOTE — Progress Notes (Signed)
   Established Patient Office Visit  Subjective   Patient ID: [name], female    DOB: [birth year]  Age: 72 y.o. MRN: [id number]  Chief Complaint  Patient presents with   Hospitalization Follow-up    HPI   Hospital follow up: Patient patient was seen in office on 04/21/2023 by colleague Hannah Beat, MD.  Patient had altered mental status and was directed to the nearest emergency department.  Patient was admitted to the hospital on 04/21/2023 And discharged on 04/27/2023 for sepsis and abnormal status.  Patient was given IV antibiotics and discharged on a 14-day course of Augmentin.  PT and evaluated the patient during hospitalization recommended out patient PT and OT this was started.  States that she will finish the oral antibiotics on Sunday.   She has started outpatient PT. They were going to do 2 days a week and have reduced her down to once a week. States that the first day was rouhg.  She does have some swelling to the    Review of Systems  Constitutional:  Negative for chills and fever.  Respiratory:  Negative for shortness of breath.   Cardiovascular:  Negative for chest pain.  Neurological:  Negative for headaches.      Objective:     BP 118/62   Pulse (!) 55   Temp 97.8 F (36.6 C)   Resp 16   Ht 5\' 4"  (1.626 m)   Wt 149 lb (67.6 kg)   SpO2 99%   BMI 25.58 kg/m    Physical Exam Vitals and nursing note reviewed.  Constitutional:      Appearance: Normal appearance.  Cardiovascular:     Rate and Rhythm: Normal rate and regular rhythm.     Heart sounds: Normal heart sounds.  Pulmonary:     Effort: Pulmonary effort is normal.     Breath sounds: Normal breath sounds.  Musculoskeletal:     Right lower leg: Edema present.     Left lower leg: Edema present.     Comments: Ruddy discoloration to bilateral lower extremities. No warmth, discharge, or erythema   Neurological:     Mental Status: She is alert.      No results found for any visits on  05/07/23.    The ASCVD Risk score (Arnett DK, et al., 2019) failed to calculate for the following reasons:   Cannot find a previous HDL lab   Cannot find a previous total cholesterol lab    Assessment & Plan:   Problem List Items Addressed This Visit       Other   Hospital discharge follow-up - Primary    Reviewed recent hospitalization notes labs and recent mission for discharge.      Bradycardia    PT was concerned the patient's heart rate.  Patient did have an echocardiogram that showed no malignant arrhythmias or pauses.  Patient is been asymptomatic.  She can continue physical therapy as prescribed as long as her stamina and endurance can handle it.       Return in about 4 weeks (around 06/04/2023) for edema, PT reheck .    Audria Nine, NP

## 2023-05-09 LAB — DHEA-SULFATE: DHEA-SO4: 4 ug/dL (ref 4–157)

## 2023-05-10 ENCOUNTER — Ambulatory Visit: Payer: Medicare Other | Admitting: "Endocrinology

## 2023-05-10 ENCOUNTER — Encounter: Payer: Self-pay | Admitting: "Endocrinology

## 2023-05-10 VITALS — BP 125/60 | HR 57 | Ht 64.0 in | Wt 142.2 lb

## 2023-05-10 DIAGNOSIS — E2740 Unspecified adrenocortical insufficiency: Secondary | ICD-10-CM

## 2023-05-10 NOTE — Patient Instructions (Signed)
Take hydrocortisone 10 mg 8 am and 5 mg at 3 pm

## 2023-05-10 NOTE — Progress Notes (Signed)
Outpatient Endocrinology Note Katherine Donnybrook, MD    [name] [birth year] [id number]  Referring Provider: Eden Emms, NP Primary Care Provider: Eden Emms, NP Reason for consultation: Subjective   Assessment & Plan  [name] was seen today for adrenal insufficiency .  Diagnoses and all orders for this visit:  Adrenal insufficiency (HCC) -     Cortisol; Future -     DHEA-sulfate   Recommend holding hydrocortisone 5 mg today (took morning dose of 5 mg) Repeat cortisol 8 am tomorrow along with DHEA-sulfate After blood work, take hydrocortisone 10 mg 8 am and 5 mg at 3 pm Patient had a low evening cortisol around 3, followed by a stim test which was short of 0.2, with final result and cortisol of 17.8 starting from a baseline of 4.2 In cases where cortisol does not rise above 18, we need to know the if the assay used to measure cortisol a more specific assay, for example, immunoassay using a more specific monoclonal antibody to cortisol?  If yes adrenal insufficiency is unlikely.  Since the assay type is unknown, we have to obtain age- and sex- specific DHEA-sulfate. If it is low, adrenal insufficiency is likely.  However in this case, given the cortisol level rose almost to 18, the adrenal insufficiency seems to be unlikely.  Will follow-up the results.  No follow-ups on file.    I have reviewed current medications, nurse's notes, allergies, vital signs, past medical and surgical history, family medical history, and social history for this encounter. Counseled patient on symptoms, examination findings, lab findings, imaging results, treatment decisions and monitoring and prognosis. The patient understood the recommendations and agrees with the treatment plan. All questions regarding treatment plan were fully answered.  Katherine Ladera, MD  05/10/23   History of Present Illness HPI  Pt is here for adrenal insufficiency work up.  Was admitted in hospital for hepatic  encephalopathy/hyperammonemia in 02/2023 and was found to have low cortisol of 2.9 around 6 pm on 03/11/23.   She is taking hydrocortisone 5 mg tid since 02/2023  In 03/2023 admitted again for infection  ACTH Stimulation test  Component     Latest Ref Rng 03/12/2023  Cortisol, Base     ug/dL 4.2   Cortisol, 30 Min     ug/dL 69.6   Cortisol, 60 Min     ug/dL 29.5      Component Ref Range & Units 05/05/23 2 wk ago 2 mo ago 2 yr ago  Cortisol, Plasma ug/dL 28.4 13.2 CM 2.9 CM 9.7 CM  Comment: AM:  4.3 - 22.4 ug/dLPM:  3.1 - 16.7 ug/dL   While on HC 5 mg tid:  Component     Latest Ref Rng 05/05/2023  Sodium     135 - 145 mEq/L 139   Potassium     3.5 - 5.1 mEq/L 3.9   Chloride     96 - 112 mEq/L 102   CO2     19 - 32 mEq/L 28   Glucose     70 - 99 mg/dL 88   BUN     6 - 23 mg/dL 13   Creatinine     4.40 - 1.20 mg/dL 1.02   GFR     >72.53 mL/min 88.93   Calcium     8.4 - 10.5 mg/dL 9.1   DHEA-SO4     4 - 157 mcg/dL 4   ALDOSTERONE      ng/dL 2  Cortisol, Plasma     ug/dL 40.9     Physical Exam  BP 125/60   Pulse (!) 57   Ht 5\' 4"  (1.626 m)   Wt 142 lb 3.2 oz (64.5 kg)   SpO2 98%   BMI 24.41 kg/m    Constitutional: well developed, well nourished Head: normocephalic, atraumatic Eyes: sclera anicteric, no redness Neck: supple Lungs: normal respiratory effort Neurology: alert and oriented Skin: dry, no appreciable rashes Musculoskeletal: no appreciable defects Psychiatric: normal mood and affect   Current Medications Patient's Medications  New Prescriptions   No medications on file  Previous Medications   ACIDOPHILUS (RISAQUAD) CAPS CAPSULE    Take 1 capsule by mouth daily.   CRANBERRY PO    Take 1 tablet by mouth daily.   ESTRADIOL (ESTRACE) 0.1 MG/GM VAGINAL CREAM    Place 1 Applicatorful vaginally every other day.   FUROSEMIDE (LASIX) 20 MG TABLET    Take 1 tablet (20 mg total) by mouth daily.   HYDROCORTISONE (CORTEF) 5 MG TABLET    Take 1 tablet  (5 mg total) by mouth 3 (three) times daily.   LACTULOSE (CHRONULAC) 10 GM/15ML SOLUTION    Take 45 mLs (30 g total) by mouth 3 (three) times daily.   LEVOCARNITINE (CARNITOR) 330 MG TABLET    Take 330 mg by mouth 3 (three) times daily.   LIVER OIL-ZINC OXIDE (DESITIN) 40 % OINTMENT    Apply topically daily as needed for irritation.   MELATONIN 10 MG TABS    Take 10 mg by mouth at bedtime as needed (sleep).   MULTIPLE VITAMIN (MULTI-VITAMIN) TABLET    Take 1 tablet by mouth daily.   POLYETHYLENE GLYCOL (MIRALAX / GLYCOLAX) 17 G PACKET    Take 17 g by mouth daily as needed for mild constipation.   SPIRONOLACTONE (ALDACTONE) 25 MG TABLET    Take 1 tablet (25 mg total) by mouth daily.   SYNTHROID 75 MCG TABLET    Take 1 tablet (75 mcg total) by mouth daily before breakfast. Must be seen in office for more refills.  Modified Medications   No medications on file  Discontinued Medications   No medications on file    Allergies Allergies  Allergen Reactions   Contrast Media [Iodinated Contrast Media] Hives    Past Medical History Past Medical History:  Diagnosis Date   Acute urinary retention 05/04/2022   Allergy 2006 ?   Contrast dye   Arthritis 2016 ??   Knees and thumb   Cancer [facility])    cecum   Cataract 2021   Surgery scheduled July 2023   Colon cancer [facility]) 2003   Elevated liver function tests    Esophageal varices (HCC)    Heart murmur On file   Hemorrhage of gastrointestinal tract 05/04/2011   Hypertension 2021   Hypothyroidism    Iron deficiency anemia    Liver disease    chemotherapy complication, per pt, shunts placed to bypass liver   Malignant neoplasm of cecum (HCC)    Portal hypertension (HCC)    Skin cancer 2019   Splenomegaly     Past Surgical History Past Surgical History:  Procedure Laterality Date   COLON SURGERY  2004   Cancer   COSMETIC SURGERY  2021   Skin cancer   ESOPHAGEAL VARICE LIGATION     EYE SURGERY     HEMICOLECTOMY  01/08/2003   IR  RADIOLOGIST EVAL & MGMT  12/20/2020   IR RADIOLOGIST EVAL & MGMT  05/29/2021   LIVER SURGERY     shunts placed after chemo complication   SKIN FULL THICKNESS GRAFT N/A 09/12/2019   Procedure: debridement and FTSG to the nose from left upper arm;  Surgeon: Allena Napoleon, MD;  Location: [facility];  Service: Plastics;  Laterality: N/A;  2 hours, please   TIPS PROCEDURE      Family History family history includes Arthritis in her father and mother; Cancer in her maternal aunt; Diabetes in her father; Hearing loss in her mother; Heart disease in her father and mother; Hypertension in her mother; Miscarriages / [place] in her mother.  Social History Social History   Socioeconomic History   Marital status: Married    Spouse name: Animal nutritionist   Number of children: 2   Years of education: Not on file   Highest education level: Not on file  Occupational History   Occupation: Librarian  Tobacco Use   Smoking status: Never   Smokeless tobacco: Never   Tobacco comments:    Never smoked  Vaping Use   Vaping Use: Never used  Substance and Sexual Activity   Alcohol use: Never   Drug use: Never   Sexual activity: Not Currently    Partners: Male  Other Topics Concern   Not on file  Social History Narrative   12/04/20   From: the area   Living: with husband, [name] (1994)   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      Family: 2 children Designer, fashion/clothing and Optometrist - 2 grandchildren - nearby      Enjoys: read      Exercise: trying to get back to exercise   Diet: healthy, limits fast foods      Safety   Seat belts: Yes    Guns: Yes  and secure   Safe in relationships: Yes    Social Determinants of Health   Financial Resource Strain: Low Risk  (03/24/2023)   Overall Financial Resource Strain (CARDIA)    Difficulty of Paying Living Expenses: Not hard at all  Food Insecurity: No Food Insecurity (04/22/2023)   Hunger Vital Sign    Worried About Running Out of Food in the  Last Year: Never true    Ran Out of Food in the Last Year: Never true  Transportation Needs: No Transportation Needs (04/22/2023)   PRAPARE - Administrator, Civil Service (Medical): No    Lack of Transportation (Non-Medical): No  Physical Activity: Inactive (03/24/2023)   Exercise Vital Sign    Days of Exercise per Week: 0 days    Minutes of Exercise per Session: 0 min  Stress: No Stress Concern Present (03/24/2023)   [employer] of Occupational Health - Occupational Stress Questionnaire    Feeling of Stress : Not at all  Social Connections: Moderately Integrated (03/24/2023)   Social Connection and Isolation Panel [NHANES]    Frequency of Communication with Friends and Family: More than three times a week    Frequency of Social Gatherings with Friends and Family: Never    Attends Religious Services: Never    Database administrator or Organizations: Yes    Attends Engineer, structural: More than 4 times per year    Marital Status: Married  Catering manager Violence: Not At Risk (04/22/2023)   Humiliation, Afraid, Rape, and Kick questionnaire    Fear of Current or Ex-Partner: No    Emotionally Abused: No    Physically Abused: No    Sexually Abused:  No    Lab Results  Component Value Date   CHOL 159 09/14/2018   Lab Results  Component Value Date   HDL 73.60 09/14/2018   Lab Results  Component Value Date   LDLCALC 77 09/14/2018   Lab Results  Component Value Date   TRIG 83 08/12/2020   Lab Results  Component Value Date   CHOLHDL 2 09/14/2018   Lab Results  Component Value Date   CREATININE 0.63 05/05/2023   Lab Results  Component Value Date   GFR 88.93 05/05/2023      Component Value Date/Time   NA 139 05/05/2023 1203   NA 143 03/19/2023 1532   NA 143 01/08/2015 1547   K 3.9 05/05/2023 1203   K 4.1 01/08/2015 1547   CL 102 05/05/2023 1203   CL 109 (H) 11/28/2012 1555   CO2 28 05/05/2023 1203   CO2 25 01/08/2015 1547   GLUCOSE 88  05/05/2023 1203   GLUCOSE 81 01/08/2015 1547   GLUCOSE 72 11/28/2012 1555   BUN 13 05/05/2023 1203   BUN 14 03/19/2023 1532   BUN 11.6 01/08/2015 1547   CREATININE 0.63 05/05/2023 1203   CREATININE 0.8 01/08/2015 1547   CALCIUM 9.1 05/05/2023 1203   CALCIUM 8.8 01/08/2015 1547   PROT 5.3 (L) 04/27/2023 0437   PROT 6.5 03/19/2023 1532   PROT 7.0 01/08/2015 1547   ALBUMIN 2.3 (L) 04/27/2023 0437   ALBUMIN 3.9 03/19/2023 1532   ALBUMIN 3.5 01/08/2015 1547   AST 66 (H) 04/27/2023 0437   AST 43 (H) 01/08/2015 1547   ALT 28 04/27/2023 0437   ALT 26 01/08/2015 1547   ALKPHOS 213 (H) 04/27/2023 0437   ALKPHOS 98 01/08/2015 1547   BILITOT 1.0 04/27/2023 0437   BILITOT 1.1 03/19/2023 1532   BILITOT 0.76 01/08/2015 1547   GFRNONAA >60 04/27/2023 0437   GFRAA >60 08/17/2020 0135      Latest Ref Rng & Units 05/05/2023   12:03 PM 04/27/2023    4:37 AM 04/26/2023    4:23 AM  BMP  Glucose 70 - 99 mg/dL 88  92  94   BUN 6 - 23 mg/dL 13  27  30    Creatinine 0.40 - 1.20 mg/dL 9.14  7.82  9.56   Sodium 135 - 145 mEq/L 139  138  135   Potassium 3.5 - 5.1 mEq/L 3.9  3.5  3.9   Chloride 96 - 112 mEq/L 102  107  110   CO2 19 - 32 mEq/L 28  25  19    Calcium 8.4 - 10.5 mg/dL 9.1  8.3  8.6        Component Value Date/Time   WBC 5.4 04/27/2023 0437   RBC 2.97 (L) 04/27/2023 0437   HGB 10.2 (L) 04/27/2023 0437   HGB 11.1 04/02/2023 1205   HGB 12.7 01/08/2015 1546   HCT 30.7 (L) 04/27/2023 0437   HCT 32.3 (L) 04/02/2023 1205   HCT 39.7 01/08/2015 1546   PLT 156 04/27/2023 0437   PLT 98 (LL) 04/02/2023 1205   MCV 103.4 (H) 04/27/2023 0437   MCV 99 (H) 04/02/2023 1205   MCV 97.8 01/08/2015 1546   MCH 34.3 (H) 04/27/2023 0437   MCHC 33.2 04/27/2023 0437   RDW 16.2 (H) 04/27/2023 0437   RDW 15.8 (H) 04/02/2023 1205   RDW 13.4 01/08/2015 1546   LYMPHSABS 0.9 04/27/2023 0437   LYMPHSABS 1.2 01/08/2015 1546   MONOABS 0.9 04/27/2023 0437   MONOABS 0.3 01/08/2015 1546  EOSABS 0.2 04/27/2023  0437   EOSABS 0.2 01/08/2015 1546   BASOSABS 0.0 04/27/2023 0437   BASOSABS 0.0 01/08/2015 1546   Lab Results  Component Value Date   TSH 1.260 02/25/2023   TSH 0.98 01/04/2023   TSH 1.62 05/18/2022   FREET4 0.88 01/15/2021   FREET4 0.79 08/01/2020   FREET4 0.48 (L) 12/25/2010         Parts of this note may have been dictated using voice recognition software. There may be variances in spelling and vocabulary which are unintentional. Not all errors are proofread. Please notify the Thereasa Parkin if any discrepancies are noted or if the meaning of any statement is not clear.

## 2023-05-11 ENCOUNTER — Ambulatory Visit: Payer: Medicare Other

## 2023-05-11 ENCOUNTER — Other Ambulatory Visit (INDEPENDENT_AMBULATORY_CARE_PROVIDER_SITE_OTHER): Payer: Medicare Other

## 2023-05-11 DIAGNOSIS — M25632 Stiffness of left wrist, not elsewhere classified: Secondary | ICD-10-CM | POA: Diagnosis not present

## 2023-05-11 DIAGNOSIS — M6281 Muscle weakness (generalized): Secondary | ICD-10-CM | POA: Diagnosis not present

## 2023-05-11 DIAGNOSIS — R5381 Other malaise: Secondary | ICD-10-CM | POA: Diagnosis not present

## 2023-05-11 DIAGNOSIS — R29898 Other symptoms and signs involving the musculoskeletal system: Secondary | ICD-10-CM | POA: Diagnosis not present

## 2023-05-11 DIAGNOSIS — E2740 Unspecified adrenocortical insufficiency: Secondary | ICD-10-CM | POA: Diagnosis not present

## 2023-05-11 DIAGNOSIS — R2681 Unsteadiness on feet: Secondary | ICD-10-CM | POA: Diagnosis not present

## 2023-05-11 DIAGNOSIS — R278 Other lack of coordination: Secondary | ICD-10-CM | POA: Diagnosis not present

## 2023-05-11 LAB — METANEPHRINES, PLASMA
Metanephrine, Free: <25 pg/mL (ref <=57)
Normetanephrine, Free: 34 pg/mL (ref <=148)
Total Metanephrines-Plasma: 34 pg/mL (ref <=205)

## 2023-05-11 LAB — ACTH: C206 ACTH: 14 pg/mL (ref 6–50)

## 2023-05-11 LAB — ALDOSTERONE: Aldosterone: 2 ng/dL

## 2023-05-11 LAB — CORTISOL: Cortisol, Plasma: 5 ug/dL

## 2023-05-11 NOTE — Therapy (Signed)
OUTPATIENT PHYSICAL THERAPY TREATMENT NOTE   Patient Name: [name] MRN: [id number] DOB:October 04, 1951, 72 y.o., female Today's Date: 05/11/2023  PCP: Eden Emms, NP  REFERRING PROVIDER: Eden Emms, NP   END OF SESSION:   PT End of Session - 05/11/23 0957     Visit Number 8    Number of Visits 12    Date for PT Re-Evaluation 06/01/23    Authorization Type UHC MCR    PT Start Time 1000    PT Stop Time 1040    PT Time Calculation (min) 40 min    Activity Tolerance Patient limited by pain;Treatment limited secondary to medical complications (Comment)    Behavior During Therapy [facility] for tasks assessed/performed             Past Medical History:  Diagnosis Date   Acute urinary retention 05/04/2022   Allergy 2006 ?   Contrast dye   Arthritis 2016 ??   Knees and thumb   Cancer (HCC)    cecum   Cataract 2021   Surgery scheduled July 2023   Colon cancer [facility]) 2003   Elevated liver function tests    Esophageal varices (HCC)    Heart murmur On file   Hemorrhage of gastrointestinal tract 05/04/2011   Hypertension 2021   Hypothyroidism    Iron deficiency anemia    Liver disease    chemotherapy complication, per pt, shunts placed to bypass liver   Malignant neoplasm of cecum (HCC)    Portal hypertension (HCC)    Skin cancer 2019   Splenomegaly    Past Surgical History:  Procedure Laterality Date   COLON SURGERY  2004   Cancer   COSMETIC SURGERY  2021   Skin cancer   ESOPHAGEAL VARICE LIGATION     EYE SURGERY     HEMICOLECTOMY  01/08/2003   IR RADIOLOGIST EVAL & MGMT  12/20/2020   IR RADIOLOGIST EVAL & MGMT  05/29/2021   LIVER SURGERY     shunts placed after chemo complication   SKIN FULL THICKNESS GRAFT N/A 09/12/2019   Procedure: debridement and FTSG to the nose from left upper arm;  Surgeon: Allena Napoleon, MD;  Location: [facility];  Service: Plastics;  Laterality: N/A;  2 hours, please   TIPS PROCEDURE     Patient Active Problem  List   Diagnosis Date Noted   Bradycardia 05/07/2023   Sepsis due to group B Streptococcus with acute renal failure and septic shock (HCC) 04/24/2023   Septic shock (HCC) 04/21/2023   Adrenal insufficiency (HCC) 03/19/2023   Protein-calorie malnutrition, severe (HCC) 02/26/2023   Acute hepatic encephalopathy (HCC) 02/25/2023   Weight loss 02/24/2023   Bowel habit changes 02/24/2023   Generalized abdominal pain 02/24/2023   Hospital discharge follow-up 02/24/2023   Weakness 01/04/2023   Frequent UTI 05/18/2022   Anemia 05/18/2022   AKI (acute kidney injury) (HCC) 04/30/2022   Tachycardia-bradycardia syndrome (HCC) 02/05/2022   Hepatic encephalopathy (HCC) 02/03/2022   Basal cell carcinoma (BCC) 05/06/2021   Cecal cancer (HCC) 05/06/2021   Hypertension 05/06/2021   COVID-19 vaccination declined 01/30/2021   Decompensated liver cirrhosis with portal HTN and gastric varices 12/04/2020   Essential hypertension 08/12/2020   Murmur, cardiac 03/19/2020   Hypothyroidism 06/30/2019   History of basal cell cancer 06/30/2019   Hx of colon cancer, stage III 11/30/2011   Esophageal varices (HCC) 06/12/2010   Portal hypertension (HCC) 01/23/2008    REFERRING DIAG: R53.1 (ICD-10-CM) - Weakness  THERAPY DIAG:  Physical deconditioning  Unsteadiness on feet  Muscle weakness (generalized)  Rationale for Evaluation and Treatment Rehabilitation  PERTINENT HISTORY:  See PMH  PRECAUTIONS: Fall  SUBJECTIVE:                                                                                                                                                                                      SUBJECTIVE STATEMENT: Patient reports continued improvements over the past week. States her back is hurting more today.  PAIN:  Are you having pain? Yes (chronic LBP)   OBJECTIVE: (objective measures completed at initial evaluation unless otherwise dated)   DIAGNOSTIC FINDINGS: none   PATIENT  SURVEYS:  FOTO 61(68 predicted); 04/28/23 45%   MUSCLE LENGTH: Not tested   POSTURE: rounded shoulders, forward head, and increased thoracic kyphosis   PALPATION: Deferred based on Dx   LOWER EXTREMITY ROM: WFL for transfers and gait   Active ROM Right eval Left eval  Hip flexion      Hip extension      Hip abduction      Hip adduction      Hip internal rotation      Hip external rotation      Knee flexion      Knee extension      Ankle dorsiflexion      Ankle plantarflexion      Ankle inversion      Ankle eversion       (Blank rows = not tested)   LOWER EXTREMITY MMT:   MMT Right eval Left eval R 04/28/23 L 6//24  Hip flexion 4- 4- 3+ 3+  Hip extension 4- 4- 3+ 3+  Hip abduction 4- 4- 3+ 3+  Hip adduction        Hip internal rotation        Hip external rotation        Knee flexion 4- 4- 3+ 3+  Knee extension 4- 4- 3+ 3+  Ankle dorsiflexion        Ankle plantarflexion 4- 4- 3 3  Ankle inversion        Ankle eversion         (Blank rows = not tested)   LOWER EXTREMITY SPECIAL TESTS:  Deferred    FUNCTIONAL TESTS:  30 seconds chair stand test 7 stands with UE assist 04/28/23 4 stands with UE support   GAIT: Distance walked: 55ftx2 Assistive device utilized: Single point cane; 04/28/23 RW Level of assistance: SBA Comments: slow cadence,    Vital signs:  O2 sats 96% HR 58   TODAY'S TREATMENT:      [facility] Adult PT Treatment:  DATE: 05/11/23 Therapeutic Exercise: HR: 58-61 bpm, O2: 95-97% throughout session Seated marching 3x30" Seated LAQ 2x10 BIL Seated hip adduction squeeze 5" hold 2x10 Seated clamshells RTB 3x10 STS x6 Standing heel raises with UE support x10, x5 Seated pball roll outs fwd x5 (discomfort in lower back)   [facility] Adult PT Treatment:                                                DATE: 05/04/23 Therapeutic Exercise: HR: 53-59 bpm, O2: 95-97% throughout session Seated marching 2x30" Seated  LAQ 2x10 BIL Seated hip adduction squeeze 5" hold x10 Supine hip adduction squeeze 5" hold x10 Supine hip fallouts RTB 2x10 B Supine march RTB 2x30" Supine heel slides 5/5 SAQs 10/10  [facility] Adult PT Treatment:                                                DATE: 04/28/23 Reasessment  [facility] Adult PT Treatment:                                                DATE: 04/20/23 Therapeutic Exercise: Nustep level 2 x 5 min Supine hip fallouts GTB 10x B, 10/10 unilaterally SLR 10/10 Supine march 10/10 Supine heel slides 10/10 SAQs 10/10 STS 5x emphasized eccentric component     PATIENT EDUCATION:  Education details: Discussed eval findings, rehab rationale and POC and patient is in agreement  Person educated: Patient Education method: Explanation Education comprehension: verbalized understanding and needs further education   HOME EXERCISE PROGRAM: Access Code: [email or web address] URL: [email or web address] Date: 04/06/2023 Prepared by: Gustavus Bryant   Exercises - Sit to Stand with Armchair  - 2 x daily - 5 x weekly - 1 sets - 5 reps - Standing Heel Raise with Support  - 2 x daily - 5 x weekly - 1-2 sets - 10 reps   ASSESSMENT:   CLINICAL IMPRESSION:  Patient presents to PT reporting improvements in her walking, but continued lower back pain and states that supine is the most uncomfortable, remained seated for today's session. Session today focused on gentle strengthening and conditioning with incorporation of more standing exercises today to good effect. She remains limited by her chronic LBP and deconditioning, but completes all exercises. Patient continues to benefit from skilled PT services and should be progressed as able to improve functional independence.     OBJECTIVE IMPAIRMENTS: Abnormal gait, decreased activity tolerance, decreased balance, decreased endurance, decreased mobility, difficulty walking, decreased strength, and postural dysfunction.    ACTIVITY LIMITATIONS:  carrying, lifting, squatting, stairs, and locomotion level   PARTICIPATION LIMITATIONS: meal prep, cleaning, laundry, shopping, community activity, and yard work   PERSONAL FACTORS: Fitness, Past/current experiences, Time since onset of injury/illness/exacerbation, and 1 comorbidity: hepatic cirrhosis   are also affecting patient's functional outcome.    REHAB POTENTIAL: Good   CLINICAL DECISION MAKING: Stable/uncomplicated   EVALUATION COMPLEXITY: Low     GOALS: Goals reviewed with patient? No   SHORT TERM GOALS: Target date: 05/27/2023   Patient to demonstrate independence in HEP  Baseline: [id number] Goal status: INITIAL  2.  285ft ambulation with SPC and S Baseline: 69ft with SPC and SBA; 9ft with RW and SBA Goal status: INITIAL       LONG TERM GOALS: Target date: 07/18/2023     Increase BLE strength to 4/5 Baseline:  MMT Right eval Left eval R 04/28/23 L 6//24  Hip flexion 4- 4- 3+ 3+  Hip extension 4- 4- 3+ 3+  Hip abduction 4- 4- 3+ 3+  Hip adduction        Hip internal rotation        Hip external rotation        Knee flexion 4- 4- 3+ 3+  Knee extension 4- 4- 3+ 3+  Ankle dorsiflexion        Ankle plantarflexion 4- 4- 3 3    Goal status: INITIAL   2.  Increase BERG score to 50 Baseline: 44 Goal status: INITIAL   3.  Increase 30s chair stand test to 10 reps w/UE support Baseline: 7 stands with UE support; 04/28/23 4 stands with UE support Goal status: INITIAL   4.  Patient to negotiate 16 steps with single rail, cane and appropriate step pattern Baseline: TBD Goal status: INITIAL  5.  Increase FOTO score to 68% Baseline: 45% Goal status: INITIAL         PLAN:   PT FREQUENCY: 1-2x/week   PT DURATION: 8 weeks   PLANNED INTERVENTIONS: Therapeutic exercises, Therapeutic activity, Neuromuscular re-education, Balance training, Gait training, Patient/Family education, Self Care, Joint mobilization, Stair training, DME instructions, Manual therapy,  and Re-evaluation   PLAN FOR NEXT SESSION: HEP review and update, manual techniques as appropriate, aerobic tasks, ROM and flexibility activities, strengthening and PREs, TPDN, gait and balance training as needed       Berta Minor, PTA 05/11/2023, 10:40 AM

## 2023-05-12 ENCOUNTER — Ambulatory Visit: Payer: Medicare Other | Admitting: Occupational Therapy

## 2023-05-12 ENCOUNTER — Encounter: Payer: Self-pay | Admitting: Occupational Therapy

## 2023-05-12 DIAGNOSIS — R5381 Other malaise: Secondary | ICD-10-CM | POA: Diagnosis not present

## 2023-05-12 DIAGNOSIS — R2681 Unsteadiness on feet: Secondary | ICD-10-CM | POA: Diagnosis not present

## 2023-05-12 DIAGNOSIS — R278 Other lack of coordination: Secondary | ICD-10-CM | POA: Diagnosis not present

## 2023-05-12 DIAGNOSIS — M6281 Muscle weakness (generalized): Secondary | ICD-10-CM | POA: Diagnosis not present

## 2023-05-12 DIAGNOSIS — R29898 Other symptoms and signs involving the musculoskeletal system: Secondary | ICD-10-CM | POA: Diagnosis not present

## 2023-05-12 DIAGNOSIS — M25632 Stiffness of left wrist, not elsewhere classified: Secondary | ICD-10-CM | POA: Diagnosis not present

## 2023-05-12 NOTE — Therapy (Unsigned)
OUTPATIENT OCCUPATIONAL THERAPY ORTHO EVALUATION  Patient Name: [name] MRN: [id number] DOB:Nov 03, 1951, 72 y.o., female Today's Date: 05/12/2023  PCP: Eden Emms, NP  REFERRING PROVIDER: Marlyne Beards, MD  END OF SESSION:  OT End of Session - 05/12/23 1146     Visit Number 1    Number of Visits 13    Date for OT Re-Evaluation 06/25/23    Authorization Type [employer] Medicare    Progress Note Due on Visit 10    OT Start Time 1148    OT Stop Time 1232    OT Time Calculation (min) 44 min    Activity Tolerance Patient tolerated treatment well    Behavior During Therapy [facility] for tasks assessed/performed             Past Medical History:  Diagnosis Date   Acute urinary retention 05/04/2022   Allergy 2006 ?   Contrast dye   Arthritis 2016 ??   Knees and thumb   Cancer (HCC)    cecum   Cataract 2021   Surgery scheduled July 2023   Colon cancer [facility]) 2003   Elevated liver function tests    Esophageal varices (HCC)    Heart murmur On file   Hemorrhage of gastrointestinal tract 05/04/2011   Hypertension 2021   Hypothyroidism    Iron deficiency anemia    Liver disease    chemotherapy complication, per pt, shunts placed to bypass liver   Malignant neoplasm of cecum (HCC)    Portal hypertension (HCC)    Skin cancer 2019   Splenomegaly    Past Surgical History:  Procedure Laterality Date   COLON SURGERY  2004   Cancer   COSMETIC SURGERY  2021   Skin cancer   ESOPHAGEAL VARICE LIGATION     EYE SURGERY     HEMICOLECTOMY  01/08/2003   IR RADIOLOGIST EVAL & MGMT  12/20/2020   IR RADIOLOGIST EVAL & MGMT  05/29/2021   LIVER SURGERY     shunts placed after chemo complication   SKIN FULL THICKNESS GRAFT N/A 09/12/2019   Procedure: debridement and FTSG to the nose from left upper arm;  Surgeon: Allena Napoleon, MD;  Location: [facility];  Service: Plastics;  Laterality: N/A;  2 hours, please   TIPS PROCEDURE     Patient Active Problem List    Diagnosis Date Noted   Bradycardia 05/07/2023   Sepsis due to group B Streptococcus with acute renal failure and septic shock (HCC) 04/24/2023   Septic shock (HCC) 04/21/2023   Adrenal insufficiency (HCC) 03/19/2023   Protein-calorie malnutrition, severe (HCC) 02/26/2023   Acute hepatic encephalopathy (HCC) 02/25/2023   Weight loss 02/24/2023   Bowel habit changes 02/24/2023   Generalized abdominal pain 02/24/2023   Hospital discharge follow-up 02/24/2023   Weakness 01/04/2023   Frequent UTI 05/18/2022   Anemia 05/18/2022   AKI (acute kidney injury) (HCC) 04/30/2022   Tachycardia-bradycardia syndrome (HCC) 02/05/2022   Hepatic encephalopathy (HCC) 02/03/2022   Basal cell carcinoma (BCC) 05/06/2021   Cecal cancer (HCC) 05/06/2021   Hypertension 05/06/2021   COVID-19 vaccination declined 01/30/2021   Decompensated liver cirrhosis with portal HTN and gastric varices 12/04/2020   Essential hypertension 08/12/2020   Murmur, cardiac 03/19/2020   Hypothyroidism 06/30/2019   History of basal cell cancer 06/30/2019   Hx of colon cancer, stage III 11/30/2011   Esophageal varices (HCC) 06/12/2010   Portal hypertension (HCC) 01/23/2008    ONSET DATE: 01/29/2023  REFERRING DIAG: fracture of  distal end of radius left  THERAPY DIAG:  Muscle weakness (generalized)  Other lack of coordination  Other symptoms and signs involving the musculoskeletal system  Rationale for Evaluation and Treatment: Rehabilitation  SUBJECTIVE:   SUBJECTIVE STATEMENT: Difficulty with carrying pots and pans. She will occasionally drive when she feels her cognition is sound. Her husband helps her coordinate appointments and manage the household.   Pt accompanied by: significant other - [name]  PERTINENT HISTORY:  Closed, L distal radius fracture, managing conservatively due to health issues. She is not a candidate for surgery.  Follows up with ortho 06/28/2023. Referred to OPOT for eval and treat.     PRECAUTIONS: Other: to tolerance  - wear brace with strenuous activity  WEIGHT BEARING RESTRICTIONS:  to tolerance LUE  PAIN:  Are you having pain? No  FALLS: Has patient fallen in last 6 months? Yes. 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 navy officer and son-in Social worker is Runner, broadcasting/film/video); was driving   PATIENT GOALS: return to [employer]   OBJECTIVE:   HAND DOMINANCE: Right  ADLs: Overall ADLs: mod I  MOBILITY STATUS: Needs Assist: use of cane  ACTIVITY TOLERANCE: Activity tolerance: fair  FUNCTIONAL OUTCOME MEASURES: Quick Dash: 29.5% disability   UPPER EXTREMITY ROM:     AROM Right (eval) Left (eval)  Shoulder flexion WNL   Shoulder abduction WNL   Elbow flexion WNL   Elbow extension WNL   Wrist flexion WNL 43*  Wrist extension WNL 39*  Wrist pronation WNL   Wrist supination WNL    Digit Composite Flexion WNL Lacks 4 cm index; 5 cm middle; 4 cm ring; 3.5 cm  Digit Composite Extension WNL   Digit Opposition WNL WFL  (Blank rows = not tested)  RUE IR limited to just behind hip  UPPER EXTREMITY MMT:     BUE: WFL shoulders and elbows  HAND FUNCTION: Grip strength: Right: 24.4 lbs; Left: 7.2 lbs  COORDINATION: 9 Hole Peg test: Right: 28 sec; Left: 38 sec  SENSATION: WFL  EDEMA: none reported or observed  COGNITION: Overall cognitive status: History of cognitive impairments - at baseline  OBSERVATIONS: Pt appears well-kept. Ambulates with cane. No LOB though slow, altered gait.   TODAY'S TREATMENT:                                                                                                                                N/A this date  PATIENT EDUCATION: Education details: OT Role and POC Person educated: Patient and Spouse Education method: Explanation Education comprehension: verbalized understanding  HOME EXERCISE PROGRAM: N/A  this visit   GOALS:  SHORT TERM GOALS: Target date: 06/09/2023    Pt will demonstrate full composite flexion of L digits.  Baseline: Lacks 4 cm index; 5 cm middle; 4 cm ring; 3.5 cm Goal status: INITIAL  2.  Pt will demonstrate L internal rotation to middle of spine to aid with ADL completion. Baseline: just behind hip Goal status: INITIAL   LONG TERM GOALS: Target date: 06/25/2023    Patient will demonstrate LUE HEP with 25% verbal cues or less for proper execution. Baseline:  Goal status: INITIAL  2.  Patient will demonstrate at least 16% improvement with quick Dash score (reporting 13.5% disability or less) indicating improved functional use of affected extremity. Baseline: 29.5% disability with use of LUE Goal status: INITIAL  3.  Pt will demonstrate at least 120* combined L wrist ext/flex AROM.  Baseline: L wrist ext - 39*; flex - 43* Goal status: INITIAL  4.  Patient will demonstrate at least 17.2 lbs L grip strength as needed to open jars and other containers. Baseline: 7.2 lbs Goal status: INITIAL  5.  Patient will complete nine-hole peg with use of L in 30 seconds or less. Baseline: 38 seconds Goal status: INITIAL   ASSESSMENT:  CLINICAL IMPRESSION: Patient is a 72 y.o. female who was seen today for occupational therapy evaluation for L distal radius fracture. Hx includes encephalopathy, colon cancer, esophageal varices, HTN, hypothyroidism, basal cell cancer, decompensated liver cirrhosis, cecal cancer, AKI, anemia, adrenal insufficiency, and sepsis. Patient currently presents below baseline level of functioning demonstrating functional deficits and impairments as noted below. Pt would benefit  from skilled OT services in the outpatient setting to work on impairments as noted below to help pt return to PLOF as able.    PERFORMANCE DEFICITS: in functional skills including ADLs, IADLs, coordination, ROM, strength, pain, Fine motor control, and UE functional use, cognitive skills including memory and thought  IMPAIRMENTS: are limiting patient from ADLs, IADLs, and work.   CO-MORBIDITIES: may have co-morbidities  that affects occupational performance. Patient will benefit from skilled OT to address above impairments and improve overall function.  MODIFICATION OR ASSISTANCE TO COMPLETE EVALUATION: Min-Moderate modification of tasks or assist with assess necessary to complete an evaluation.  OT OCCUPATIONAL PROFILE AND HISTORY: Problem focused assessment: Including review of records relating to presenting problem.  CLINICAL DECISION MAKING: LOW - limited treatment options, no task modification necessary  REHAB POTENTIAL: Good  EVALUATION COMPLEXITY: Low    PLAN:  OT FREQUENCY: 2x/week  OT DURATION: 6 weeks  PLANNED INTERVENTIONS: self care/ADL training, therapeutic exercise, therapeutic activity, manual therapy, passive range of motion, splinting, ultrasound, paraffin, fluidotherapy, moist heat, contrast bath, patient/family education, DME and/or AE instructions, and Re-evaluation  RECOMMENDED OTHER SERVICES: none at this time  CONSULTED AND AGREED WITH PLAN OF CARE: Patient and family Technical brewer FOR NEXT SESSION: Initiate LUE ROM HEP   Delana Meyer, OT 05/12/2023, 3:27 PM

## 2023-05-17 ENCOUNTER — Ambulatory Visit: Payer: Medicare Other | Admitting: Occupational Therapy

## 2023-05-17 DIAGNOSIS — M6281 Muscle weakness (generalized): Secondary | ICD-10-CM

## 2023-05-17 DIAGNOSIS — M25632 Stiffness of left wrist, not elsewhere classified: Secondary | ICD-10-CM | POA: Diagnosis not present

## 2023-05-17 DIAGNOSIS — R278 Other lack of coordination: Secondary | ICD-10-CM

## 2023-05-17 DIAGNOSIS — R29898 Other symptoms and signs involving the musculoskeletal system: Secondary | ICD-10-CM | POA: Diagnosis not present

## 2023-05-17 DIAGNOSIS — R2681 Unsteadiness on feet: Secondary | ICD-10-CM | POA: Diagnosis not present

## 2023-05-17 DIAGNOSIS — R5381 Other malaise: Secondary | ICD-10-CM | POA: Diagnosis not present

## 2023-05-17 NOTE — Therapy (Signed)
OUTPATIENT OCCUPATIONAL THERAPY ORTHO TREATMENT  Patient Name: [name] MRN: [id number] DOB:Feb 03, 1951, 72 y.o., female Today's Date: 05/17/2023  PCP: Eden Emms, NP  REFERRING PROVIDER: Marlyne Beards, MD  END OF SESSION:  OT End of Session - 05/17/23 1435     Visit Number 2    Number of Visits 13    Date for OT Re-Evaluation 06/25/23    Authorization Type [employer] Medicare    Progress Note Due on Visit 10    OT Start Time 1440    OT Stop Time 1525    OT Time Calculation (min) 45 min    Activity Tolerance Patient tolerated treatment well    Behavior During Therapy [facility] for tasks assessed/performed             Past Medical History:  Diagnosis Date   Acute urinary retention 05/04/2022   Allergy 2006 ?   Contrast dye   Arthritis 2016 ??   Knees and thumb   Cancer (HCC)    cecum   Cataract 2021   Surgery scheduled July 2023   Colon cancer [facility]) 2003   Elevated liver function tests    Esophageal varices (HCC)    Heart murmur On file   Hemorrhage of gastrointestinal tract 05/04/2011   Hypertension 2021   Hypothyroidism    Iron deficiency anemia    Liver disease    chemotherapy complication, per pt, shunts placed to bypass liver   Malignant neoplasm of cecum (HCC)    Portal hypertension (HCC)    Skin cancer 2019   Splenomegaly    Past Surgical History:  Procedure Laterality Date   COLON SURGERY  2004   Cancer   COSMETIC SURGERY  2021   Skin cancer   ESOPHAGEAL VARICE LIGATION     EYE SURGERY     HEMICOLECTOMY  01/08/2003   IR RADIOLOGIST EVAL & MGMT  12/20/2020   IR RADIOLOGIST EVAL & MGMT  05/29/2021   LIVER SURGERY     shunts placed after chemo complication   SKIN FULL THICKNESS GRAFT N/A 09/12/2019   Procedure: debridement and FTSG to the nose from left upper arm;  Surgeon: Allena Napoleon, MD;  Location: [facility];  Service: Plastics;  Laterality: N/A;  2 hours, please   TIPS PROCEDURE     Patient Active Problem List    Diagnosis Date Noted   Bradycardia 05/07/2023   Sepsis due to group B Streptococcus with acute renal failure and septic shock (HCC) 04/24/2023   Septic shock (HCC) 04/21/2023   Adrenal insufficiency (HCC) 03/19/2023   Protein-calorie malnutrition, severe (HCC) 02/26/2023   Acute hepatic encephalopathy (HCC) 02/25/2023   Weight loss 02/24/2023   Bowel habit changes 02/24/2023   Generalized abdominal pain 02/24/2023   Hospital discharge follow-up 02/24/2023   Weakness 01/04/2023   Frequent UTI 05/18/2022   Anemia 05/18/2022   AKI (acute kidney injury) (HCC) 04/30/2022   Tachycardia-bradycardia syndrome (HCC) 02/05/2022   Hepatic encephalopathy (HCC) 02/03/2022   Basal cell carcinoma (BCC) 05/06/2021   Cecal cancer (HCC) 05/06/2021   Hypertension 05/06/2021   COVID-19 vaccination declined 01/30/2021   Decompensated liver cirrhosis with portal HTN and gastric varices 12/04/2020   Essential hypertension 08/12/2020   Murmur, cardiac 03/19/2020   Hypothyroidism 06/30/2019   History of basal cell cancer 06/30/2019   Hx of colon cancer, stage III 11/30/2011   Esophageal varices (HCC) 06/12/2010   Portal hypertension (HCC) 01/23/2008    ONSET DATE: 01/29/2023  REFERRING DIAG: fracture of  distal end of radius left  THERAPY DIAG:  Muscle weakness (generalized)  Other lack of coordination  Other symptoms and signs involving the musculoskeletal system  Rationale for Evaluation and Treatment: Rehabilitation  SUBJECTIVE:  SUBJECTIVE STATEMENT: Patient reports her wrist has been okay.  She wears her brace at night, in crowds or with activities that might make it hurt or be uncomfortable.    Pt accompanied by: significant other - [name]  PERTINENT HISTORY:  Closed, L distal radius fracture, managing conservatively due to health issues. She is not a candidate for surgery.  Follows up with ortho 06/28/2023. Referred to OPOT for eval and treat.    PRECAUTIONS: Other: to tolerance  - wear  brace with strenuous activity  WEIGHT BEARING RESTRICTIONS:  to tolerance LUE  PAIN:  Are you having pain? No  FALLS: Has patient fallen in last 6 months? Yes. 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 navy officer and son-in Social worker is Runner, broadcasting/film/video); was driving   PATIENT GOALS: return to [employer]   OBJECTIVE:   HAND DOMINANCE: Right  ADLs: Overall ADLs: mod I  MOBILITY STATUS: Needs Assist: use of cane  ACTIVITY TOLERANCE: Activity tolerance: fair  FUNCTIONAL OUTCOME MEASURES: Quick Dash: 29.5% disability  - See image/specific items on eval 05/12/23  UPPER EXTREMITY ROM:     AROM Right (eval) Left (eval)  Shoulder flexion WNL   Shoulder abduction WNL   Elbow flexion WNL   Elbow extension WNL   Wrist flexion WNL 43*  Wrist extension WNL 39*  Wrist pronation WNL   Wrist supination WNL    Digit Composite Flexion WNL Lacks 4 cm index; 5 cm middle; 4 cm ring; 3.5 cm  Digit Composite Extension WNL   Digit Opposition WNL WFL  (Blank rows = not tested)  RUE IR limited to just behind hip  UPPER EXTREMITY MMT:     BUE: WFL shoulders and elbows  HAND FUNCTION: Grip strength: Right: 24.4 lbs; Left: 7.2 lbs  COORDINATION: 9 Hole Peg test: Right: 28 sec; Left: 38 sec  SENSATION: WFL  EDEMA: none reported or observed  COGNITION: Overall cognitive status: History of cognitive impairments - at baseline  OBSERVATIONS: Pt appears well-kept. Ambulates with cane. No LOB though slow, altered gait.   TODAY'S TREATMENT:                                                                                                                                Therapeutic Exercises  MedBridge Exercises provided with videos shown, access provided and education on performing exercises throughout the day 2 + times/daily  up to 10 reps with changing positions to work on gravity assisted/gravity eliminated positions.    Education provided on moving through a pain free stretch, trying not to let her arm move and focus on the wrist movements,   - Wrist Prayer Stretch  - Place hands together with palms facing inward and gently push hands together and lower them downward to stretch wrist into extension.  - Wrist AROM Flexion Extension  - Slowly bend wrist back and forth and try motion with hand facing upwards or downwards or sideways while bending the wrist back and forth to allow gravity to help or challenge motion.  - Seated Finger Composite Flexion Extension  - OT labelled joints I) MCP, 2) PIP and 3) DIP and encouraged 3 motions: *Hook - MCP straight, DIP/PIP flexed -- patient has limited hook position of R hand and is encouraged to compare and practice with strong side to work towards equal motions *Flat - DIP straight, MCP/PIP flexed *Fist - MCP/DIP/PIP flexed  - Gripping Sponge Neutral  - Encouraged to work on squeezing different things - washcloth, sponge etc  - Rotate ball in fingertips.  - Slide pen up and down and flip pen around in fingers.  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  PATIENT EDUCATION: Education details: Wrist ROM - stretch and AROM Person educated: Patient and Spouse Education method: Explanation, Demonstration, Tactile cues, Verbal cues, and Handouts Education comprehension: verbalized understanding, returned demonstration, verbal cues required, tactile cues required, and needs further education  HOME EXERCISE PROGRAM: 05/17/23 - Wrist ROM Access Code: [email or web address] URL:  [email or web address] Prepared by: Amada Kingfisher    GOALS:  SHORT TERM GOALS: Target date: 06/09/2023    Pt will demonstrate full composite flexion of L digits.  Baseline: Lacks 4 cm index; 5 cm middle; 4 cm ring; 3.5 cm Goal status: IN PROGRESS  2.  Pt will demonstrate L internal rotation to middle of spine to aid with ADL completion. Baseline: just behind hip Goal status: INITIAL   LONG TERM GOALS: Target date: 06/25/2023    Patient will demonstrate LUE HEP with 25% verbal cues or less for proper execution. Baseline:  Goal status: IN PROGRESS  2.  Patient will demonstrate at least 16% improvement with quick Dash score (reporting 13.5% disability or less) indicating improved functional use of affected extremity. Baseline: 29.5% disability with use of LUE Goal status: INITIAL  3.  Pt will demonstrate at least 120* combined L wrist ext/flex AROM.  Baseline: L wrist ext - 39*; flex - 43* Goal status: IN PROGRESS  4.  Patient will demonstrate at least 17.2 lbs L grip strength as needed to open jars and other containers. Baseline: 7.2 lbs Goal status: INITIAL  5.  Patient will complete nine-hole peg with use of L in 30 seconds or less. Baseline: 38 seconds Goal status: INITIAL   ASSESSMENT:  CLINICAL IMPRESSION: Patient is a 72 y.o. female who was seen today for occupational therapy first OT treatment today s/p evaluation last week for for L distal radius fracture. Patient is continuing to use her wrist brace and is encouraged to progress activities without it as she is cleared to do so and currently presents below baseline level of functioning with L UE/hand. Patient does not have pain with movement - just has stiffness and some discomfort with extreme ROM.  She is encouraged to conduct HEP ROM within comfortable parameters.  Pt will benefit from  skilled OT services in the outpatient setting to work on limitations in L ROM and strength to help pt return to PLOF as  able.    PERFORMANCE DEFICITS: in functional skills including ADLs, IADLs, coordination, ROM, strength, pain, Fine motor control, and UE functional use, cognitive skills including memory and thought  IMPAIRMENTS: are limiting patient from ADLs, IADLs, and work.   CO-MORBIDITIES: may have co-morbidities  that affects occupational performance. Patient will benefit from skilled OT to address above impairments and improve overall function.  REHAB POTENTIAL: Good   PLAN:  OT FREQUENCY: 2x/week  OT DURATION: 6 weeks  PLANNED INTERVENTIONS: self care/ADL training, therapeutic exercise, therapeutic activity, manual therapy, passive range of motion, splinting, ultrasound, paraffin, fluidotherapy, moist heat, contrast bath, patient/family education, DME and/or AE instructions, and Re-evaluation  RECOMMENDED OTHER SERVICES: none at this time  CONSULTED AND AGREED WITH PLAN OF CARE: Patient and family member/caregiver  PLAN FOR NEXT SESSION: Progress LUE ROM, strength and coordination HEP.  Consider modalities (fluidotherapy) for progressing comfort with ROM.  Victorino Sparrow, OT 05/17/2023, 3:57 PM

## 2023-05-17 NOTE — Therapy (Unsigned)
OUTPATIENT PHYSICAL THERAPY TREATMENT NOTE   Patient Name: [name] MRN: [id number] DOB:14-Jan-1951, 72 y.o., female Today's Date: 05/18/2023  PCP: Eden Emms, NP  REFERRING PROVIDER: Eden Emms, NP   END OF SESSION:   PT End of Session - 05/18/23 1358     Visit Number 9    Number of Visits 12    Date for PT Re-Evaluation 06/01/23    Authorization Type UHC MCR    PT Start Time 1400    PT Stop Time 1440    PT Time Calculation (min) 40 min    Activity Tolerance Patient limited by pain;Treatment limited secondary to medical complications (Comment)    Behavior During Therapy [facility] for tasks assessed/performed             Past Medical History:  Diagnosis Date   Acute urinary retention 05/04/2022   Allergy 2006 ?   Contrast dye   Arthritis 2016 ??   Knees and thumb   Cancer (HCC)    cecum   Cataract 2021   Surgery scheduled July 2023   Colon cancer [facility]) 2003   Elevated liver function tests    Esophageal varices (HCC)    Heart murmur On file   Hemorrhage of gastrointestinal tract 05/04/2011   Hypertension 2021   Hypothyroidism    Iron deficiency anemia    Liver disease    chemotherapy complication, per pt, shunts placed to bypass liver   Malignant neoplasm of cecum (HCC)    Portal hypertension (HCC)    Skin cancer 2019   Splenomegaly    Past Surgical History:  Procedure Laterality Date   COLON SURGERY  2004   Cancer   COSMETIC SURGERY  2021   Skin cancer   ESOPHAGEAL VARICE LIGATION     EYE SURGERY     HEMICOLECTOMY  01/08/2003   IR RADIOLOGIST EVAL & MGMT  12/20/2020   IR RADIOLOGIST EVAL & MGMT  05/29/2021   LIVER SURGERY     shunts placed after chemo complication   SKIN FULL THICKNESS GRAFT N/A 09/12/2019   Procedure: debridement and FTSG to the nose from left upper arm;  Surgeon: Allena Napoleon, MD;  Location: [facility];  Service: Plastics;  Laterality: N/A;  2 hours, please   TIPS PROCEDURE     Patient Active Problem  List   Diagnosis Date Noted   Bradycardia 05/07/2023   Sepsis due to group B Streptococcus with acute renal failure and septic shock (HCC) 04/24/2023   Septic shock (HCC) 04/21/2023   Adrenal insufficiency (HCC) 03/19/2023   Protein-calorie malnutrition, severe (HCC) 02/26/2023   Acute hepatic encephalopathy (HCC) 02/25/2023   Weight loss 02/24/2023   Bowel habit changes 02/24/2023   Generalized abdominal pain 02/24/2023   Hospital discharge follow-up 02/24/2023   Weakness 01/04/2023   Frequent UTI 05/18/2022   Anemia 05/18/2022   AKI (acute kidney injury) (HCC) 04/30/2022   Tachycardia-bradycardia syndrome (HCC) 02/05/2022   Hepatic encephalopathy (HCC) 02/03/2022   Basal cell carcinoma (BCC) 05/06/2021   Cecal cancer (HCC) 05/06/2021   Hypertension 05/06/2021   COVID-19 vaccination declined 01/30/2021   Decompensated liver cirrhosis with portal HTN and gastric varices 12/04/2020   Essential hypertension 08/12/2020   Murmur, cardiac 03/19/2020   Hypothyroidism 06/30/2019   History of basal cell cancer 06/30/2019   Hx of colon cancer, stage III 11/30/2011   Esophageal varices (HCC) 06/12/2010   Portal hypertension (HCC) 01/23/2008    REFERRING DIAG: R53.1 (ICD-10-CM) - Weakness  THERAPY DIAG:  Muscle weakness (generalized)  Physical deconditioning  Unsteadiness on feet  Rationale for Evaluation and Treatment Rehabilitation  PERTINENT HISTORY:  See PMH  PRECAUTIONS: Fall  SUBJECTIVE:                                                                                                                                                                                      SUBJECTIVE STATEMENT: Feels her endurance and stamina have picked up.  Ambulating farther and longer as well as arising from chairs with less difficulty.  Low back remains a limiting factor.  PAIN:  Are you having pain? Yes (chronic LBP)   OBJECTIVE: (objective measures completed at initial evaluation  unless otherwise dated)   DIAGNOSTIC FINDINGS: none   PATIENT SURVEYS:  FOTO 61(68 predicted); 04/28/23 45%   MUSCLE LENGTH: Not tested   POSTURE: rounded shoulders, forward head, and increased thoracic kyphosis   PALPATION: Deferred based on Dx   LOWER EXTREMITY ROM: WFL for transfers and gait   Active ROM Right eval Left eval  Hip flexion      Hip extension      Hip abduction      Hip adduction      Hip internal rotation      Hip external rotation      Knee flexion      Knee extension      Ankle dorsiflexion      Ankle plantarflexion      Ankle inversion      Ankle eversion       (Blank rows = not tested)   LOWER EXTREMITY MMT:   MMT Right eval Left eval R 04/28/23 L 6//24  Hip flexion 4- 4- 3+ 3+  Hip extension 4- 4- 3+ 3+  Hip abduction 4- 4- 3+ 3+  Hip adduction        Hip internal rotation        Hip external rotation        Knee flexion 4- 4- 3+ 3+  Knee extension 4- 4- 3+ 3+  Ankle dorsiflexion        Ankle plantarflexion 4- 4- 3 3  Ankle inversion        Ankle eversion         (Blank rows = not tested)   LOWER EXTREMITY SPECIAL TESTS:  Deferred    FUNCTIONAL TESTS:  30 seconds chair stand test 7 stands with UE assist 04/28/23 4 stands with UE support   GAIT: Distance walked: 16ftx2 Assistive device utilized: Single point cane; 04/28/23 RW Level of assistance: SBA Comments: slow cadence,    Vital signs:  O2 sats 96% HR 58  TODAY'S TREATMENT:      [facility] Adult PT Treatment:                                                DATE: 05/18/23 HR 65 O2 sats 97% Therapeutic Exercise: STS 10x w/UE support Seated hip tosses 2000g ball 10/10 Seated shoulder tosses with unweighted ball 10/10 Seated chops unweighted ball 10/10 LAQs with adduction 15x Seated ball roll flexion 3s hold 5x (increased lumbosacral symptoms) Seated ball press for core activation 3s hold 10x2    Therapeutic Activity: 2 MWT 176ft w/cane    [facility] Adult PT Treatment:                                                 DATE: 05/11/23 Therapeutic Exercise: HR: 58-61 bpm, O2: 95-97% throughout session Seated marching 3x30" Seated LAQ 2x10 BIL Seated hip adduction squeeze 5" hold 2x10 Seated clamshells RTB 3x10 STS x6 Standing heel raises with UE support x10, x5 Seated pball roll outs fwd x5 (discomfort in lower back)   [facility] Adult PT Treatment:                                                DATE: 05/04/23 Therapeutic Exercise: HR: 53-59 bpm, O2: 95-97% throughout session Seated marching 2x30" Seated LAQ 2x10 BIL Seated hip adduction squeeze 5" hold x10 Supine hip adduction squeeze 5" hold x10 Supine hip fallouts RTB 2x10 B Supine march RTB 2x30" Supine heel slides 5/5 SAQs 10/10  [facility] Adult PT Treatment:                                                DATE: 04/28/23 Reasessment  [facility] Adult PT Treatment:                                                DATE: 04/20/23 Therapeutic Exercise: Nustep level 2 x 5 min Supine hip fallouts GTB 10x B, 10/10 unilaterally SLR 10/10 Supine march 10/10 Supine heel slides 10/10 SAQs 10/10 STS 5x emphasized eccentric component     PATIENT EDUCATION:  Education details: Discussed eval findings, rehab rationale and POC and patient is in agreement  Person educated: Patient Education method: Explanation Education comprehension: verbalized understanding and needs further education   HOME EXERCISE PROGRAM: Access Code: [email or web address] URL: [email or web address] Date: 04/06/2023 Prepared by: Gustavus Bryant   Exercises - Sit to Stand with Armchair  - 2 x daily - 5 x weekly - 1 sets - 5 reps - Standing Heel Raise with Support  - 2 x daily - 5 x weekly - 1-2 sets - 10 reps   ASSESSMENT:   CLINICAL IMPRESSION: Focus of today's session was trunk and LE strengthening.  Assessed 2 MWT for baseline function.  Incorporated seated core tasks for trunk strength and control  OBJECTIVE IMPAIRMENTS: Abnormal gait, decreased  activity tolerance, decreased balance, decreased endurance, decreased mobility, difficulty walking, decreased strength, and postural dysfunction.    ACTIVITY LIMITATIONS: carrying, lifting, squatting, stairs, and locomotion level   PARTICIPATION LIMITATIONS: meal prep, cleaning, laundry, shopping, community activity, and yard work   PERSONAL FACTORS: Fitness, Past/current experiences, Time since onset of injury/illness/exacerbation, and 1 comorbidity: hepatic cirrhosis   are also affecting patient's functional outcome.    REHAB POTENTIAL: Good   CLINICAL DECISION MAKING: Stable/uncomplicated   EVALUATION COMPLEXITY: Low     GOALS: Goals reviewed with patient? No   SHORT TERM GOALS: Target date: 05/27/2023   Patient to demonstrate independence in HEP  Baseline: [id number] Goal status: INITIAL   2.  211ft ambulation with SPC and S Baseline: 35ft with SPC and SBA; 45ft with RW and SBA Goal status: INITIAL       LONG TERM GOALS: Target date: 07/18/2023     Increase BLE strength to 4/5 Baseline:  MMT Right eval Left eval R 04/28/23 L 6//24  Hip flexion 4- 4- 3+ 3+  Hip extension 4- 4- 3+ 3+  Hip abduction 4- 4- 3+ 3+  Hip adduction        Hip internal rotation        Hip external rotation        Knee flexion 4- 4- 3+ 3+  Knee extension 4- 4- 3+ 3+  Ankle dorsiflexion        Ankle plantarflexion 4- 4- 3 3    Goal status: INITIAL   2.  Increase BERG score to 50 Baseline: 44 Goal status: INITIAL   3.  Increase 30s chair stand test to 10 reps w/UE support Baseline: 7 stands with UE support; 04/28/23 4 stands with UE support Goal status: INITIAL   4.  Patient to negotiate 16 steps with single rail, cane and appropriate step pattern Baseline: TBD Goal status: INITIAL  5.  Increase FOTO score to 68% Baseline: 45% Goal status: INITIAL         PLAN:   PT FREQUENCY: 1-2x/week   PT DURATION: 8 weeks   PLANNED INTERVENTIONS: Therapeutic exercises, Therapeutic  activity, Neuromuscular re-education, Balance training, Gait training, Patient/Family education, Self Care, Joint mobilization, Stair training, DME instructions, Manual therapy, and Re-evaluation   PLAN FOR NEXT SESSION: HEP review and update, manual techniques as appropriate, aerobic tasks, ROM and flexibility activities, strengthening and PREs, TPDN, gait and balance training as needed       Hildred Laser, PT 05/18/2023, 2:49 PM

## 2023-05-17 NOTE — Patient Instructions (Signed)
Wrist ROM URL: [email or web address] Access Code: [email or web address] Prepared by: Amada Kingfisher  Exercises - Wrist Prayer Stretch  - 2 x daily - 10 reps - Wrist AROM Flexion Extension  - 2 x daily - 10 reps - Seated Finger Composite Flexion Extension  - 2 x daily - 10 reps - Gripping Sponge Neutral  - 2 x daily - 10 reps

## 2023-05-18 ENCOUNTER — Ambulatory Visit: Payer: Medicare Other

## 2023-05-18 DIAGNOSIS — R29898 Other symptoms and signs involving the musculoskeletal system: Secondary | ICD-10-CM | POA: Diagnosis not present

## 2023-05-18 DIAGNOSIS — R5381 Other malaise: Secondary | ICD-10-CM

## 2023-05-18 DIAGNOSIS — R278 Other lack of coordination: Secondary | ICD-10-CM | POA: Diagnosis not present

## 2023-05-18 DIAGNOSIS — M6281 Muscle weakness (generalized): Secondary | ICD-10-CM

## 2023-05-18 DIAGNOSIS — R2681 Unsteadiness on feet: Secondary | ICD-10-CM | POA: Diagnosis not present

## 2023-05-18 DIAGNOSIS — M25632 Stiffness of left wrist, not elsewhere classified: Secondary | ICD-10-CM | POA: Diagnosis not present

## 2023-05-18 NOTE — Therapy (Unsigned)
OUTPATIENT PHYSICAL THERAPY TREATMENT NOTE   Patient Name: [name] MRN: [id number] DOB:Oct 23, 1951, 72 y.o., female Today's Date: 05/20/2023  PCP: Eden Emms, NP  REFERRING PROVIDER: Eden Emms, NP   END OF SESSION:   PT End of Session - 05/20/23 1403     Visit Number 10    Number of Visits 12    Date for PT Re-Evaluation 06/01/23    Authorization Type UHC MCR    PT Start Time 1400    PT Stop Time 1440    PT Time Calculation (min) 40 min    Activity Tolerance Patient limited by pain;Treatment limited secondary to medical complications (Comment)    Behavior During Therapy [facility] for tasks assessed/performed              Past Medical History:  Diagnosis Date   Acute urinary retention 05/04/2022   Allergy 2006 ?   Contrast dye   Arthritis 2016 ??   Knees and thumb   Cancer (HCC)    cecum   Cataract 2021   Surgery scheduled July 2023   Colon cancer [facility]) 2003   Elevated liver function tests    Esophageal varices (HCC)    Heart murmur On file   Hemorrhage of gastrointestinal tract 05/04/2011   Hypertension 2021   Hypothyroidism    Iron deficiency anemia    Liver disease    chemotherapy complication, per pt, shunts placed to bypass liver   Malignant neoplasm of cecum (HCC)    Portal hypertension (HCC)    Skin cancer 2019   Splenomegaly    Past Surgical History:  Procedure Laterality Date   COLON SURGERY  2004   Cancer   COSMETIC SURGERY  2021   Skin cancer   ESOPHAGEAL VARICE LIGATION     EYE SURGERY     HEMICOLECTOMY  01/08/2003   IR RADIOLOGIST EVAL & MGMT  12/20/2020   IR RADIOLOGIST EVAL & MGMT  05/29/2021   LIVER SURGERY     shunts placed after chemo complication   SKIN FULL THICKNESS GRAFT N/A 09/12/2019   Procedure: debridement and FTSG to the nose from left upper arm;  Surgeon: Allena Napoleon, MD;  Location: [facility];  Service: Plastics;  Laterality: N/A;  2 hours, please   TIPS PROCEDURE     Patient Active  Problem List   Diagnosis Date Noted   Bradycardia 05/07/2023   Sepsis due to group B Streptococcus with acute renal failure and septic shock (HCC) 04/24/2023   Septic shock (HCC) 04/21/2023   Adrenal insufficiency (HCC) 03/19/2023   Protein-calorie malnutrition, severe (HCC) 02/26/2023   Acute hepatic encephalopathy (HCC) 02/25/2023   Weight loss 02/24/2023   Bowel habit changes 02/24/2023   Generalized abdominal pain 02/24/2023   Hospital discharge follow-up 02/24/2023   Weakness 01/04/2023   Frequent UTI 05/18/2022   Anemia 05/18/2022   AKI (acute kidney injury) (HCC) 04/30/2022   Tachycardia-bradycardia syndrome (HCC) 02/05/2022   Hepatic encephalopathy (HCC) 02/03/2022   Basal cell carcinoma (BCC) 05/06/2021   Cecal cancer (HCC) 05/06/2021   Hypertension 05/06/2021   COVID-19 vaccination declined 01/30/2021   Decompensated liver cirrhosis with portal HTN and gastric varices 12/04/2020   Essential hypertension 08/12/2020   Murmur, cardiac 03/19/2020   Hypothyroidism 06/30/2019   History of basal cell cancer 06/30/2019   Hx of colon cancer, stage III 11/30/2011   Esophageal varices (HCC) 06/12/2010   Portal hypertension (HCC) 01/23/2008    REFERRING DIAG: R53.1 (ICD-10-CM) - Weakness  THERAPY DIAG:  Muscle weakness (generalized)  Physical deconditioning  Rationale for Evaluation and Treatment Rehabilitation  PERTINENT HISTORY:  See PMH  PRECAUTIONS: Fall  SUBJECTIVE:                                                                                                                                                                                      SUBJECTIVE STATEMENT: No adverse affects reported after last session.  No increased pain or soreness reported. R lumbosacral pain still a limiting factor in mobility  PAIN:  Are you having pain? Yes (chronic LBP)   OBJECTIVE: (objective measures completed at initial evaluation unless otherwise dated)   DIAGNOSTIC  FINDINGS: none   PATIENT SURVEYS:  FOTO 61(68 predicted); 04/28/23 45% 05/20/23 49%   MUSCLE LENGTH: Not tested   POSTURE: rounded shoulders, forward head, and increased thoracic kyphosis   PALPATION: Deferred based on Dx   LOWER EXTREMITY ROM: WFL for transfers and gait   Active ROM Right eval Left eval  Hip flexion      Hip extension      Hip abduction      Hip adduction      Hip internal rotation      Hip external rotation      Knee flexion      Knee extension      Ankle dorsiflexion      Ankle plantarflexion      Ankle inversion      Ankle eversion       (Blank rows = not tested)   LOWER EXTREMITY MMT:   MMT Right eval Left eval R 04/28/23 L 6//24  Hip flexion 4- 4- 3+ 3+  Hip extension 4- 4- 3+ 3+  Hip abduction 4- 4- 3+ 3+  Hip adduction        Hip internal rotation        Hip external rotation        Knee flexion 4- 4- 3+ 3+  Knee extension 4- 4- 3+ 3+  Ankle dorsiflexion        Ankle plantarflexion 4- 4- 3 3  Ankle inversion        Ankle eversion         (Blank rows = not tested)   LOWER EXTREMITY SPECIAL TESTS:  Deferred    FUNCTIONAL TESTS:  30 seconds chair stand test 7 stands with UE assist 04/28/23 4 stands with UE support 05/20/23 7 stands with UE Support   GAIT: Distance walked: 56ftx2 Assistive device utilized: Single point cane; 04/28/23 RW Level of assistance: SBA Comments: slow cadence,    Vital signs:  O2 sats 96% HR 58   TODAY'S  TREATMENT:      [facility] Adult PT Treatment:                                                DATE: 05/20/23 HR 70 O2 sats 95%  Therapeutic Exercise: FAQs 2# with adduction 15x  FAQs 2# alternating 15/15 Seated march 2# 15/15 Standing hamstring curls 2#  Standing heel raises 15x Alt. shoulder flexion/hip extension on wall 10/10 Seated hip tosses w/ball 10/10 Nustep L1 6 min  [facility] Adult PT Treatment:                                                DATE: 05/18/23 HR 65 O2 sats 97% Therapeutic Exercise: STS  10x w/UE support Seated hip tosses 2000g ball 10/10 Seated shoulder tosses with unweighted ball 10/10 Seated chops unweighted ball 10/10 LAQs with adduction 15x Seated ball roll flexion 3s hold 5x (increased lumbosacral symptoms) Seated ball press for core activation 3s hold 10x2    Therapeutic Activity: 2 MWT 144ft w/cane    [facility] Adult PT Treatment:                                                DATE: 05/11/23 Therapeutic Exercise: HR: 58-61 bpm, O2: 95-97% throughout session Seated marching 3x30" Seated LAQ 2x10 BIL Seated hip adduction squeeze 5" hold 2x10 Seated clamshells RTB 3x10 STS x6 Standing heel raises with UE support x10, x5 Seated pball roll outs fwd x5 (discomfort in lower back)   [facility] Adult PT Treatment:                                                DATE: 05/04/23 Therapeutic Exercise: HR: 53-59 bpm, O2: 95-97% throughout session Seated marching 2x30" Seated LAQ 2x10 BIL Seated hip adduction squeeze 5" hold x10 Supine hip adduction squeeze 5" hold x10 Supine hip fallouts RTB 2x10 B Supine march RTB 2x30" Supine heel slides 5/5 SAQs 10/10  [facility] Adult PT Treatment:                                                DATE: 04/28/23 Reasessment  [facility] Adult PT Treatment:                                                DATE: 04/20/23 Therapeutic Exercise: Nustep level 2 x 5 min Supine hip fallouts GTB 10x B, 10/10 unilaterally SLR 10/10 Supine march 10/10 Supine heel slides 10/10 SAQs 10/10 STS 5x emphasized eccentric component     PATIENT EDUCATION:  Education details: Discussed eval findings, rehab rationale and POC and patient is in agreement  Person educated: Patient Education method: Explanation Education comprehension: verbalized understanding and  needs further education   HOME EXERCISE PROGRAM: Access Code: [email or web address] URL: [email or web address] Date: 04/06/2023 Prepared by: Gustavus Bryant   Exercises - Sit to Stand with Armchair  - 2 x  daily - 5 x weekly - 1 sets - 5 reps - Standing Heel Raise with Support  - 2 x daily - 5 x weekly - 1-2 sets - 10 reps   ASSESSMENT:   CLINICAL IMPRESSION: Focus of today's session was PREs and functional strengthening tasks.  Added weight and resistance to increase difficulty of exercises.  Incorporated more standing tasks and resumed aerobic training against minimal resistance.  Palpation of R lumbosacral region finds irritable nodule not anything that appears musculoskeletal.   OBJECTIVE IMPAIRMENTS: Abnormal gait, decreased activity tolerance, decreased balance, decreased endurance, decreased mobility, difficulty walking, decreased strength, and postural dysfunction.    ACTIVITY LIMITATIONS: carrying, lifting, squatting, stairs, and locomotion level   PARTICIPATION LIMITATIONS: meal prep, cleaning, laundry, shopping, community activity, and yard work   PERSONAL FACTORS: Fitness, Past/current experiences, Time since onset of injury/illness/exacerbation, and 1 comorbidity: hepatic cirrhosis   are also affecting patient's functional outcome.    REHAB POTENTIAL: Good   CLINICAL DECISION MAKING: Stable/uncomplicated   EVALUATION COMPLEXITY: Low     GOALS: Goals reviewed with patient? No   SHORT TERM GOALS: Target date: 05/27/2023   Patient to demonstrate independence in HEP  Baseline: [id number] Goal status: INITIAL   2.  252ft ambulation with SPC and S Baseline: 1ft with SPC and SBA; 31ft with RW and SBA Goal status: INITIAL       LONG TERM GOALS: Target date: 07/18/2023     Increase BLE strength to 4/5 Baseline:  MMT Right eval Left eval R 04/28/23 L 6//24  Hip flexion 4- 4- 3+ 3+  Hip extension 4- 4- 3+ 3+  Hip abduction 4- 4- 3+ 3+  Hip adduction        Hip internal rotation        Hip external rotation        Knee flexion 4- 4- 3+ 3+  Knee extension 4- 4- 3+ 3+  Ankle dorsiflexion        Ankle plantarflexion 4- 4- 3 3    Goal status: INITIAL   2.  Increase  BERG score to 50 Baseline: 44 Goal status: INITIAL   3.  Increase 30s chair stand test to 10 reps w/UE support Baseline: 7 stands with UE support; 04/28/23 4 stands with UE support Goal status: INITIAL   4.  Patient to negotiate 16 steps with single rail, cane and appropriate step pattern Baseline: TBD Goal status: INITIAL  5.  Increase FOTO score to 68% Baseline: 45% Goal status: INITIAL         PLAN:   PT FREQUENCY: 1-2x/week   PT DURATION: 8 weeks   PLANNED INTERVENTIONS: Therapeutic exercises, Therapeutic activity, Neuromuscular re-education, Balance training, Gait training, Patient/Family education, Self Care, Joint mobilization, Stair training, DME instructions, Manual therapy, and Re-evaluation   PLAN FOR NEXT SESSION: HEP review and update, manual techniques as appropriate, aerobic tasks, ROM and flexibility activities, strengthening and PREs, TPDN, gait and balance training as needed       Hildred Laser, PT 05/20/2023, 3:12 PM

## 2023-05-18 NOTE — Telephone Encounter (Signed)
Addressed in new encounter.

## 2023-05-19 ENCOUNTER — Ambulatory Visit: Payer: Medicare Other | Admitting: Occupational Therapy

## 2023-05-19 DIAGNOSIS — M6281 Muscle weakness (generalized): Secondary | ICD-10-CM | POA: Diagnosis not present

## 2023-05-19 DIAGNOSIS — R278 Other lack of coordination: Secondary | ICD-10-CM

## 2023-05-19 DIAGNOSIS — R2681 Unsteadiness on feet: Secondary | ICD-10-CM | POA: Diagnosis not present

## 2023-05-19 DIAGNOSIS — M25632 Stiffness of left wrist, not elsewhere classified: Secondary | ICD-10-CM

## 2023-05-19 DIAGNOSIS — R5381 Other malaise: Secondary | ICD-10-CM | POA: Diagnosis not present

## 2023-05-19 DIAGNOSIS — R29898 Other symptoms and signs involving the musculoskeletal system: Secondary | ICD-10-CM | POA: Diagnosis not present

## 2023-05-19 NOTE — Therapy (Signed)
OUTPATIENT OCCUPATIONAL THERAPY ORTHO TREATMENT  Patient Name: [name] MRN: [id number] DOB:December 08, 1950, 72 y.o., female Today's Date: 05/19/2023  PCP: Eden Emms, NP  REFERRING PROVIDER: Marlyne Beards, MD  END OF SESSION:  OT End of Session - 05/19/23 1537     Visit Number 3    Number of Visits 13    Date for OT Re-Evaluation 06/25/23    Authorization Type [employer]    Progress Note Due on Visit 10    OT Start Time 1535    OT Stop Time 1620    OT Time Calculation (min) 45 min    Equipment Utilized During Treatment Fluidotherapy, FM objects    Activity Tolerance Patient tolerated treatment well;No increased pain    Behavior During Therapy [facility] for tasks assessed/performed             Past Medical History:  Diagnosis Date   Acute urinary retention 05/04/2022   Allergy 2006 ?   Contrast dye   Arthritis 2016 ??   Knees and thumb   Cancer (HCC)    cecum   Cataract 2021   Surgery scheduled July 2023   Colon cancer [facility]) 2003   Elevated liver function tests    Esophageal varices (HCC)    Heart murmur On file   Hemorrhage of gastrointestinal tract 05/04/2011   Hypertension 2021   Hypothyroidism    Iron deficiency anemia    Liver disease    chemotherapy complication, per pt, shunts placed to bypass liver   Malignant neoplasm of cecum (HCC)    Portal hypertension (HCC)    Skin cancer 2019   Splenomegaly    Past Surgical History:  Procedure Laterality Date   COLON SURGERY  2004   Cancer   COSMETIC SURGERY  2021   Skin cancer   ESOPHAGEAL VARICE LIGATION     EYE SURGERY     HEMICOLECTOMY  01/08/2003   IR RADIOLOGIST EVAL & MGMT  12/20/2020   IR RADIOLOGIST EVAL & MGMT  05/29/2021   LIVER SURGERY     shunts placed after chemo complication   SKIN FULL THICKNESS GRAFT N/A 09/12/2019   Procedure: debridement and FTSG to the nose from left upper arm;  Surgeon: Allena Napoleon, MD;  Location: [facility];  Service: Plastics;   Laterality: N/A;  2 hours, please   TIPS PROCEDURE     Patient Active Problem List   Diagnosis Date Noted   Bradycardia 05/07/2023   Sepsis due to group B Streptococcus with acute renal failure and septic shock (HCC) 04/24/2023   Septic shock (HCC) 04/21/2023   Adrenal insufficiency (HCC) 03/19/2023   Protein-calorie malnutrition, severe (HCC) 02/26/2023   Acute hepatic encephalopathy (HCC) 02/25/2023   Weight loss 02/24/2023   Bowel habit changes 02/24/2023   Generalized abdominal pain 02/24/2023   Hospital discharge follow-up 02/24/2023   Weakness 01/04/2023   Frequent UTI 05/18/2022   Anemia 05/18/2022   AKI (acute kidney injury) (HCC) 04/30/2022   Tachycardia-bradycardia syndrome (HCC) 02/05/2022   Hepatic encephalopathy (HCC) 02/03/2022   Basal cell carcinoma (BCC) 05/06/2021   Cecal cancer (HCC) 05/06/2021   Hypertension 05/06/2021   COVID-19 vaccination declined 01/30/2021   Decompensated liver cirrhosis with portal HTN and gastric varices 12/04/2020   Essential hypertension 08/12/2020   Murmur, cardiac 03/19/2020   Hypothyroidism 06/30/2019   History of basal cell cancer 06/30/2019   Hx of colon cancer, stage III 11/30/2011   Esophageal varices (HCC) 06/12/2010   Portal hypertension (HCC)  01/23/2008    ONSET DATE: 01/29/2023  REFERRING DIAG: fracture of distal end of radius left  THERAPY DIAG:  Muscle weakness (generalized)  Stiffness of left wrist, not elsewhere classified  Other lack of coordination  Rationale for Evaluation and Treatment: Rehabilitation  SUBJECTIVE:  SUBJECTIVE STATEMENT: Patient reports her wrist has been okay and the activities and stretches she has been doing have not made it hurt any worse.  She got a set of resistance balls that she has been using for finger manipulation.  Pt accompanied by: significant other - [name]  PERTINENT HISTORY:  Closed, L distal radius fracture, managing conservatively due to health issues. She is not a  candidate for surgery.  Follows up with ortho 06/28/2023. Referred to OPOT for eval and treat.    PRECAUTIONS: Other: to tolerance  - wear brace with strenuous activity  WEIGHT BEARING RESTRICTIONS:  to tolerance LUE  PAIN:  Are you having pain? No  FALLS: Has patient fallen in last 6 months? Yes. 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 navy officer and son-in Social worker is Runner, broadcasting/film/video); was driving   PATIENT GOALS: return to [employer]   OBJECTIVE:   HAND DOMINANCE: Right  ADLs: Overall ADLs: mod I  MOBILITY STATUS: Needs Assist: use of cane  ACTIVITY TOLERANCE: Activity tolerance: fair  FUNCTIONAL OUTCOME MEASURES: Quick Dash: 29.5% disability  - See image/specific items on eval 05/12/23  UPPER EXTREMITY ROM:     AROM Right (eval) Left (eval)  Shoulder flexion WNL   Shoulder abduction WNL   Elbow flexion WNL   Elbow extension WNL   Wrist flexion WNL 43*  Wrist extension WNL 39*  Wrist pronation WNL   Wrist supination WNL    Digit Composite Flexion WNL Lacks 4 cm index; 5 cm middle; 4 cm ring; 3.5 cm  Digit Composite Extension WNL   Digit Opposition WNL WFL  (Blank rows = not tested)  RUE IR limited to just behind hip  UPPER EXTREMITY MMT:     BUE: WFL shoulders and elbows  HAND FUNCTION: Grip strength: Right: 24.4 lbs; Left: 7.2 lbs  COORDINATION: 9 Hole Peg test: Right: 28 sec; Left: 38 sec  SENSATION: WFL  EDEMA: none reported or observed  COGNITION: Overall cognitive status: History of cognitive impairments - at baseline  OBSERVATIONS: Pt appears well-kept. Ambulates with  cane. No LOB though slow, altered gait.   TODAY'S TREATMENT:                                                                                                                               Therapeutic  Exercises   Pt placed LUE in Fluidotherapy machine with supervised ROM x 10 min. Pt was educated to complete LUE wrist extension/flexion with forearm pronated and supinated during modality time to improve AROM and decrease stiffness of affected extremity by use of the machine's massaging action and thermal properties.    Patient able to extend L wrist > 40 degrees today and had obvious improvement in ease of ROM of wrist upon completion as noted by ability to prop her hand on her hip with wrist flexed and/or extended without much thought.  Patient has a heat pad at home and it was suggested to use the heat to help her be able to stretch the wrist into more extension.  She was assisted to work on wrist stretches with hand praying position with cues to lift her elbows like 'chicken wings' to stretch the wrist.   Therapeutic Activities  Coordination Exercise handout with images provided to work on L UE wrist and finger ROM, dexterity and isolated movements with demonstration and practice, hand over hand guidance and cues throughout.    Reviewed rotating ball in fingertips (clockwise and counter-clockwise). And rotating golf balls in her hands with arm supinated and wrist extended as tolerated.   Shuffle card, flip cards 1 at a time for game like WAR with encouragement to work on wrist flexion/extension, forearm pronation/supination and pinches to flip cards.    Picking up small objects ie) coins, dominoes, buttons, marbles, dried beans/pasta of different sizes ... To place in containers To stack -- either one at a time or to make stacks of coins.  To line up -- ie) dominos while working on extending and flexing wrist. To pick up items one at a time until she gets 5+ in her hand and then move item  from palm to fingertips to release.  -- She needed extra time initially but improved ease and speed over time/repetition.   Tear a piece of paper towel and roll it into small balls. -- With cues and guidance to isolate index and thumb motions while extending wrist in supinated position to watch the movements.  Encouraged to continue to twirl pen between fingers. -- He had trouble isolating fingers individually and was encouraged to use L side simultaneously to help progress activities.  She is encourage to relax her shoulder, minimize compensatory motions and a try different activities throughout the week.  PATIENT EDUCATION: Education details: Coordination tasks. Person educated: Patient and Spouse Education method: Explanation, Demonstration, Tactile cues, Verbal cues, and Handouts Education comprehension: verbalized understanding, returned demonstration, verbal cues required, tactile cues required, and needs further education  HOME EXERCISE PROGRAM: 05/17/23 - Wrist ROM Access Code: [facility]  05/19/23 - Coordination Activities Handout with images   GOALS:  SHORT TERM GOALS: Target date: 06/09/2023    Pt will demonstrate full composite flexion of L digits.  Baseline: Lacks 4 cm index; 5 cm middle; 4 cm ring; 3.5 cm Goal status: IN PROGRESS  2.  Pt will demonstrate L internal rotation to middle of spine to aid with ADL completion. Baseline: just behind hip Goal status: IN PROGRESS   LONG TERM GOALS: Target date: 06/25/2023    Patient will demonstrate LUE HEP with 25% verbal cues or less for proper execution. Baseline:  Goal status: IN PROGRESS  2.  Patient will demonstrate at least 16% improvement with quick Dash score (reporting 13.5% disability or less) indicating improved functional use of affected extremity (opening containers, washing back, carrying objects/bags). Baseline: 29.5% disability with use  of LUE Goal status: IN PROGRESS  3.  Pt will demonstrate at least 120*  combined L wrist ext/flex AROM.  Baseline: L wrist ext - 39*; flex - 43* Goal status: IN PROGRESS  4.  Patient will demonstrate at least 17.2 lbs L grip strength as needed to open jars and other containers. Baseline: 7.2 lbs Goal status: INITIAL  5.  Patient will complete nine-hole peg with use of L in 30 seconds or less. Baseline: 38 seconds Goal status: INITIAL   ASSESSMENT:  CLINICAL IMPRESSION: Patient is seen today for occupational therapy today for UE limitations s/p L distal radius fracture. Patient arrived with her brace on but was encouraged to leave it off after fluidotherapy today to allow more natural motions of arm/wrist. Patient's stiffness was significantly improved s/p AROM during Fluidotherapy.  She was provided coordination HEP to work on with encouragement to combine FM tasks with focus on wrist ROM of L arm also.  Pt will benefit from continued skilled OT services in the outpatient setting to work on limitations in L ROM and strength to help pt return to PLOF as able.    PERFORMANCE DEFICITS: in functional skills including ADLs, IADLs, coordination, ROM, strength, pain, Fine motor control, and UE functional use, cognitive skills including memory and thought  IMPAIRMENTS: are limiting patient from ADLs, IADLs, and work.   CO-MORBIDITIES: may have co-morbidities  that affects occupational performance. Patient will benefit from skilled OT to address above impairments and improve overall function.  REHAB POTENTIAL: Good   PLAN:  OT FREQUENCY: 2x/week  OT DURATION: 6 weeks  PLANNED INTERVENTIONS: self care/ADL training, therapeutic exercise, therapeutic activity, manual therapy, passive range of motion, splinting, ultrasound, paraffin, fluidotherapy, moist heat, contrast bath, patient/family education, DME and/or AE instructions, and Re-evaluation  RECOMMENDED OTHER SERVICES: none at this time  CONSULTED AND AGREED WITH PLAN OF CARE: Patient and family  member/caregiver  PLAN FOR NEXT SESSION: Progress LUE ROM, strength ( PUTTY ) and coordination HEP.  Modalities (fluidotherapy/US) for progressing P/AROM and stretching wrist .  Heat handout.  Victorino Sparrow, OT 05/19/2023, 4:55 PM

## 2023-05-20 ENCOUNTER — Ambulatory Visit: Payer: Medicare Other

## 2023-05-20 DIAGNOSIS — R29898 Other symptoms and signs involving the musculoskeletal system: Secondary | ICD-10-CM | POA: Diagnosis not present

## 2023-05-20 DIAGNOSIS — R5381 Other malaise: Secondary | ICD-10-CM | POA: Diagnosis not present

## 2023-05-20 DIAGNOSIS — R278 Other lack of coordination: Secondary | ICD-10-CM | POA: Diagnosis not present

## 2023-05-20 DIAGNOSIS — R2681 Unsteadiness on feet: Secondary | ICD-10-CM | POA: Diagnosis not present

## 2023-05-20 DIAGNOSIS — M25632 Stiffness of left wrist, not elsewhere classified: Secondary | ICD-10-CM | POA: Diagnosis not present

## 2023-05-20 DIAGNOSIS — M6281 Muscle weakness (generalized): Secondary | ICD-10-CM | POA: Diagnosis not present

## 2023-05-21 ENCOUNTER — Encounter: Payer: Self-pay | Admitting: "Endocrinology

## 2023-05-21 ENCOUNTER — Ambulatory Visit: Payer: Medicare Other | Admitting: "Endocrinology

## 2023-05-21 VITALS — BP 140/70 | HR 62 | Ht 64.0 in | Wt 141.8 lb

## 2023-05-21 DIAGNOSIS — E2740 Unspecified adrenocortical insufficiency: Secondary | ICD-10-CM

## 2023-05-21 NOTE — Progress Notes (Signed)
Outpatient Endocrinology Note Katherine [place], MD    [name] Sep 14, 1951 [id number]  Referring Provider: Eden Emms, NP Primary Care Provider: Eden Emms, NP Reason for consultation: Subjective   Assessment & Plan  [name] was seen today for diabetes.  Diagnoses and all orders for this visit:  Adrenal insufficiency (HCC) -     Cortisol; Future -     DHEA-sulfate; Future   Currently taking hydrocortisone 10 mg qam and 5 mg at 3 pm After 2 weeks, recommend holding hydrocortisone 5 mg evening dose followed by cortisol 8 am next morning along with DHEA-sulfate After blood work, resume hydrocortisone 10 mg 8 am and 5 mg at 3 pm Patient had a low evening cortisol around 3, followed by a stim test which was short of 0.2, with final result and cortisol of 17.8 starting from a baseline of 4.2. Holding 5 mg hydrocortisone dose in evening, 8 am cortisol came low at 5  In cases where cortisol does not rise above 18, we need to know the if the assay used to measure cortisol a more specific assay, for example, immunoassay using a more specific monoclonal antibody to cortisol?  If yes adrenal insufficiency is unlikely.  Since the assay type is unknown, we have to obtain age- and sex- specific DHEA-sulfate. If it is low, adrenal insufficiency is likely.  However in this case, given the cortisol level rose almost to 18, the adrenal insufficiency seems to be unlikely.  Will follow-up the results.  Return in about 4 months (around 09/20/2023).    I have reviewed current medications, nurse's notes, allergies, vital signs, past medical and surgical history, family medical history, and social history for this encounter. Counseled patient on symptoms, examination findings, lab findings, imaging results, treatment decisions and monitoring and prognosis. The patient understood the recommendations and agrees with the treatment plan. All questions regarding treatment plan were fully  answered.  Katherine [place], MD  05/21/23   History of Present Illness HPI  Pt is here for adrenal insufficiency work up.  Was admitted in hospital for hepatic encephalopathy/hyperammonemia in 02/2023 and was found to have low cortisol of 2.9 around 6 pm on 03/11/23.   She is taking hydrocortisone 5 mg tid since 02/2023  Currently taking hydrocortisone 10 mg qam and 5 mg at 3 pm   Patient feels well otherwise, has ntoed no chage   In 03/2023 admitted again for infection  After holding hydrocortisone 5 mg dose in evening Component     Latest Ref Rng 05/11/2023  Cortisol, Plasma     ug/dL 5.0      ACTH Stimulation test  Component     Latest Ref Rng 03/12/2023  Cortisol, Base     ug/dL 4.2   Cortisol, 30 Min     ug/dL 40.9   Cortisol, 60 Min     ug/dL 81.1      Component Ref Range & Units 05/05/23 2 wk ago 2 mo ago 2 yr ago  Cortisol, Plasma ug/dL 91.4 78.2 CM 2.9 CM 9.7 CM  Comment: AM:  4.3 - 22.4 ug/dLPM:  3.1 - 16.7 ug/dL   While on HC 5 mg tid:  Component     Latest Ref Rng 05/05/2023  Sodium     135 - 145 mEq/L 139   Potassium     3.5 - 5.1 mEq/L 3.9   Chloride     96 - 112 mEq/L 102   CO2     19 -  32 mEq/L 28   Glucose     70 - 99 mg/dL 88   BUN     6 - 23 mg/dL 13   Creatinine     4.69 - 1.20 mg/dL 6.29   GFR     >52.84 mL/min 88.93   Calcium     8.4 - 10.5 mg/dL 9.1   DHEA-SO4     4 - 157 mcg/dL 4   ALDOSTERONE      ng/dL 2   Cortisol, Plasma     ug/dL 13.2     Physical Exam  BP (!) 140/70   Pulse 62   Ht 5\' 4"  (1.626 m)   Wt 141 lb 12.8 oz (64.3 kg)   SpO2 97%   BMI 24.34 kg/m    Constitutional: well developed, well nourished Head: normocephalic, atraumatic Eyes: sclera anicteric, no redness Neck: supple Lungs: normal respiratory effort Neurology: alert and oriented Skin: dry, no appreciable rashes Musculoskeletal: no appreciable defects Psychiatric: normal mood and affect   Current Medications Patient's Medications  New  Prescriptions   No medications on file  Previous Medications   ACIDOPHILUS (RISAQUAD) CAPS CAPSULE    Take 1 capsule by mouth daily.   CRANBERRY PO    Take 1 tablet by mouth daily.   ESTRADIOL (ESTRACE) 0.1 MG/GM VAGINAL CREAM    Place 1 Applicatorful vaginally every other day.   FUROSEMIDE (LASIX) 20 MG TABLET    Take 1 tablet (20 mg total) by mouth daily.   HYDROCORTISONE (CORTEF) 5 MG TABLET    Take 1 tablet (5 mg total) by mouth 3 (three) times daily.   LACTULOSE (CHRONULAC) 10 GM/15ML SOLUTION    Take 45 mLs (30 g total) by mouth 3 (three) times daily.   LEVOCARNITINE (CARNITOR) 330 MG TABLET    Take 330 mg by mouth 3 (three) times daily.   LIVER OIL-ZINC OXIDE (DESITIN) 40 % OINTMENT    Apply topically daily as needed for irritation.   MELATONIN 10 MG TABS    Take 10 mg by mouth at bedtime as needed (sleep).   MULTIPLE VITAMIN (MULTI-VITAMIN) TABLET    Take 1 tablet by mouth daily.   POLYETHYLENE GLYCOL (MIRALAX / GLYCOLAX) 17 G PACKET    Take 17 g by mouth daily as needed for mild constipation.   SPIRONOLACTONE (ALDACTONE) 25 MG TABLET    Take 1 tablet (25 mg total) by mouth daily.   SYNTHROID 75 MCG TABLET    Take 1 tablet (75 mcg total) by mouth daily before breakfast. Must be seen in office for more refills.  Modified Medications   No medications on file  Discontinued Medications   No medications on file    Allergies Allergies  Allergen Reactions   Contrast Media [Iodinated Contrast Media] Hives    Past Medical History Past Medical History:  Diagnosis Date   Acute urinary retention 05/04/2022   Allergy 2006 ?   Contrast dye   Arthritis 2016 ??   Knees and thumb   Cancer [facility])    cecum   Cataract 2021   Surgery scheduled July 2023   Colon cancer [facility]) 2003   Elevated liver function tests    Esophageal varices (HCC)    Heart murmur On file   Hemorrhage of gastrointestinal tract 05/04/2011   Hypertension 2021   Hypothyroidism    Iron deficiency anemia    Liver  disease    chemotherapy complication, per pt, shunts placed to bypass liver   Malignant neoplasm of  cecum (HCC)    Portal hypertension (HCC)    Skin cancer 2019   Splenomegaly     Past Surgical History Past Surgical History:  Procedure Laterality Date   COLON SURGERY  2004   Cancer   COSMETIC SURGERY  2021   Skin cancer   ESOPHAGEAL VARICE LIGATION     EYE SURGERY     HEMICOLECTOMY  01/08/2003   IR RADIOLOGIST EVAL & MGMT  12/20/2020   IR RADIOLOGIST EVAL & MGMT  05/29/2021   LIVER SURGERY     shunts placed after chemo complication   SKIN FULL THICKNESS GRAFT N/A 09/12/2019   Procedure: debridement and FTSG to the nose from left upper arm;  Surgeon: Allena Napoleon, MD;  Location: [facility];  Service: Plastics;  Laterality: N/A;  2 hours, please   TIPS PROCEDURE      Family History family history includes Arthritis in her father and mother; Cancer in her maternal aunt; Diabetes in her father; Hearing loss in her mother; Heart disease in her father and mother; Hypertension in her mother; Miscarriages / [place] in her mother.  Social History Social History   Socioeconomic History   Marital status: Married    Spouse name: Animal nutritionist   Number of children: 2   Years of education: Not on file   Highest education level: Not on file  Occupational History   Occupation: Librarian  Tobacco Use   Smoking status: Never   Smokeless tobacco: Never   Tobacco comments:    Never smoked  Vaping Use   Vaping Use: Never used  Substance and Sexual Activity   Alcohol use: Never   Drug use: Never   Sexual activity: Not Currently    Partners: Male  Other Topics Concern   Not on file  Social History Narrative   12/04/20   From: the area   Living: with husband, [name] (1994)   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      Family: 2 children Designer, fashion/clothing and Optometrist - 2 grandchildren - nearby      Enjoys: read      Exercise: trying to get back to exercise   Diet:  healthy, limits fast foods      Safety   Seat belts: Yes    Guns: Yes  and secure   Safe in relationships: Yes    Social Determinants of Health   Financial Resource Strain: Low Risk  (03/24/2023)   Overall Financial Resource Strain (CARDIA)    Difficulty of Paying Living Expenses: Not hard at all  Food Insecurity: No Food Insecurity (04/22/2023)   Hunger Vital Sign    Worried About Running Out of Food in the Last Year: Never true    Ran Out of Food in the Last Year: Never true  Transportation Needs: No Transportation Needs (04/22/2023)   PRAPARE - Administrator, Civil Service (Medical): No    Lack of Transportation (Non-Medical): No  Physical Activity: Inactive (03/24/2023)   Exercise Vital Sign    Days of Exercise per Week: 0 days    Minutes of Exercise per Session: 0 min  Stress: No Stress Concern Present (03/24/2023)   [employer] of Occupational Health - Occupational Stress Questionnaire    Feeling of Stress : Not at all  Social Connections: Moderately Integrated (03/24/2023)   Social Connection and Isolation Panel [NHANES]    Frequency of Communication with Friends and Family: More than three times a week    Frequency of Social  Gatherings with Friends and Family: Never    Attends Religious Services: Never    Active Member of Clubs or Organizations: Yes    Attends Banker Meetings: More than 4 times per year    Marital Status: Married  Catering manager Violence: Not At Risk (04/22/2023)   Humiliation, Afraid, Rape, and Kick questionnaire    Fear of Current or Ex-Partner: No    Emotionally Abused: No    Physically Abused: No    Sexually Abused: No    Lab Results  Component Value Date   CHOL 159 09/14/2018   Lab Results  Component Value Date   HDL 73.60 09/14/2018   Lab Results  Component Value Date   LDLCALC 77 09/14/2018   Lab Results  Component Value Date   TRIG 83 08/12/2020   Lab Results  Component Value Date   CHOLHDL 2  09/14/2018   Lab Results  Component Value Date   CREATININE 0.63 05/05/2023   Lab Results  Component Value Date   GFR 88.93 05/05/2023      Component Value Date/Time   NA 139 05/05/2023 1203   NA 143 03/19/2023 1532   NA 143 01/08/2015 1547   K 3.9 05/05/2023 1203   K 4.1 01/08/2015 1547   CL 102 05/05/2023 1203   CL 109 (H) 11/28/2012 1555   CO2 28 05/05/2023 1203   CO2 25 01/08/2015 1547   GLUCOSE 88 05/05/2023 1203   GLUCOSE 81 01/08/2015 1547   GLUCOSE 72 11/28/2012 1555   BUN 13 05/05/2023 1203   BUN 14 03/19/2023 1532   BUN 11.6 01/08/2015 1547   CREATININE 0.63 05/05/2023 1203   CREATININE 0.8 01/08/2015 1547   CALCIUM 9.1 05/05/2023 1203   CALCIUM 8.8 01/08/2015 1547   PROT 5.3 (L) 04/27/2023 0437   PROT 6.5 03/19/2023 1532   PROT 7.0 01/08/2015 1547   ALBUMIN 2.3 (L) 04/27/2023 0437   ALBUMIN 3.9 03/19/2023 1532   ALBUMIN 3.5 01/08/2015 1547   AST 66 (H) 04/27/2023 0437   AST 43 (H) 01/08/2015 1547   ALT 28 04/27/2023 0437   ALT 26 01/08/2015 1547   ALKPHOS 213 (H) 04/27/2023 0437   ALKPHOS 98 01/08/2015 1547   BILITOT 1.0 04/27/2023 0437   BILITOT 1.1 03/19/2023 1532   BILITOT 0.76 01/08/2015 1547   GFRNONAA >60 04/27/2023 0437   GFRAA >60 08/17/2020 0135      Latest Ref Rng & Units 05/05/2023   12:03 PM 04/27/2023    4:37 AM 04/26/2023    4:23 AM  BMP  Glucose 70 - 99 mg/dL 88  92  94   BUN 6 - 23 mg/dL 13  27  30    Creatinine 0.40 - 1.20 mg/dL 1.61  0.96  0.45   Sodium 135 - 145 mEq/L 139  138  135   Potassium 3.5 - 5.1 mEq/L 3.9  3.5  3.9   Chloride 96 - 112 mEq/L 102  107  110   CO2 19 - 32 mEq/L 28  25  19    Calcium 8.4 - 10.5 mg/dL 9.1  8.3  8.6        Component Value Date/Time   WBC 5.4 04/27/2023 0437   RBC 2.97 (L) 04/27/2023 0437   HGB 10.2 (L) 04/27/2023 0437   HGB 11.1 04/02/2023 1205   HGB 12.7 01/08/2015 1546   HCT 30.7 (L) 04/27/2023 0437   HCT 32.3 (L) 04/02/2023 1205   HCT 39.7 01/08/2015 1546   PLT 156 04/27/2023  0437    PLT 98 (LL) 04/02/2023 1205   MCV 103.4 (H) 04/27/2023 0437   MCV 99 (H) 04/02/2023 1205   MCV 97.8 01/08/2015 1546   MCH 34.3 (H) 04/27/2023 0437   MCHC 33.2 04/27/2023 0437   RDW 16.2 (H) 04/27/2023 0437   RDW 15.8 (H) 04/02/2023 1205   RDW 13.4 01/08/2015 1546   LYMPHSABS 0.9 04/27/2023 0437   LYMPHSABS 1.2 01/08/2015 1546   MONOABS 0.9 04/27/2023 0437   MONOABS 0.3 01/08/2015 1546   EOSABS 0.2 04/27/2023 0437   EOSABS 0.2 01/08/2015 1546   BASOSABS 0.0 04/27/2023 0437   BASOSABS 0.0 01/08/2015 1546   Lab Results  Component Value Date   TSH 1.260 02/25/2023   TSH 0.98 01/04/2023   TSH 1.62 05/18/2022   FREET4 0.88 01/15/2021   FREET4 0.79 08/01/2020   FREET4 0.48 (L) 12/25/2010         Parts of this note may have been dictated using voice recognition software. There may be variances in spelling and vocabulary which are unintentional. Not all errors are proofread. Please notify the Thereasa Parkin if any discrepancies are noted or if the meaning of any statement is not clear.

## 2023-05-24 ENCOUNTER — Ambulatory Visit: Payer: Medicare Other | Attending: Nurse Practitioner | Admitting: Occupational Therapy

## 2023-05-24 DIAGNOSIS — R278 Other lack of coordination: Secondary | ICD-10-CM | POA: Diagnosis not present

## 2023-05-24 DIAGNOSIS — M25632 Stiffness of left wrist, not elsewhere classified: Secondary | ICD-10-CM | POA: Insufficient documentation

## 2023-05-24 DIAGNOSIS — R5381 Other malaise: Secondary | ICD-10-CM | POA: Diagnosis not present

## 2023-05-24 DIAGNOSIS — R29898 Other symptoms and signs involving the musculoskeletal system: Secondary | ICD-10-CM | POA: Diagnosis not present

## 2023-05-24 DIAGNOSIS — M6281 Muscle weakness (generalized): Secondary | ICD-10-CM | POA: Insufficient documentation

## 2023-05-24 DIAGNOSIS — R2681 Unsteadiness on feet: Secondary | ICD-10-CM | POA: Insufficient documentation

## 2023-05-24 NOTE — Patient Instructions (Addendum)
     Heat Home Program  Heat is used as part of your therapy for several reasons.  Heat increases blood flow, which promotes healing. Heat also relaxes muscles and joints which makes exercising easier.  It can be applied for __10-15_ minutes,  _up to 3_ sessions per day  Use one of the following methods that is most convenient for you  Heating pad: Follow instructions given by manufacturer.  Use on low-medium setting.    Moist heated towel: Soak towel in hot water or place damp towel in microwave and heat for 30 sec. Check temperature to determine if further time needed.  Wring out towel and wrap around affected area, cover with a plastic bag.  Can also wrap a dry towel around the plastic to help retain the heat.    Microwave gel pack: Follow instructions given by manufacturer.  Check temperature before application to ensure not hot enough to cause a burn    Rice sock: This can be made using a sock with no synthetic material and 1.5 cups of rice.  Pour the rice into the sock, tie a knot at the top.  Place in microwave for 1 minute, remove to check temperature to determine if further heating time is required prior to application  Be sure to check the skin periodically to ensure no excessive redness or blisters.  Use with caution in areas with decreased sensation 

## 2023-05-24 NOTE — Therapy (Signed)
OUTPATIENT OCCUPATIONAL THERAPY ORTHO TREATMENT  Patient Name: [name] MRN: [id number] DOB:February 08, 1951, 72 y.o., female Today's Date: 05/24/2023  PCP: Eden Emms, NP  REFERRING PROVIDER: Marlyne Beards, MD  END OF SESSION:  OT End of Session - 05/24/23 1448     Visit Number 4    Number of Visits 13    Date for OT Re-Evaluation 06/25/23    Authorization Type [employer] Medicare    Progress Note Due on Visit 10    OT Start Time 1447    OT Stop Time 1530    OT Time Calculation (min) 43 min    Equipment Utilized During Treatment Yellow Putty & FM objects    Activity Tolerance Patient tolerated treatment well;No increased pain    Behavior During Therapy [facility] for tasks assessed/performed             Past Medical History:  Diagnosis Date   Acute urinary retention 05/04/2022   Allergy 2006 ?   Contrast dye   Arthritis 2016 ??   Knees and thumb   Cancer (HCC)    cecum   Cataract 2021   Surgery scheduled July 2023   Colon cancer [facility]) 2003   Elevated liver function tests    Esophageal varices (HCC)    Heart murmur On file   Hemorrhage of gastrointestinal tract 05/04/2011   Hypertension 2021   Hypothyroidism    Iron deficiency anemia    Liver disease    chemotherapy complication, per pt, shunts placed to bypass liver   Malignant neoplasm of cecum (HCC)    Portal hypertension (HCC)    Skin cancer 2019   Splenomegaly    Past Surgical History:  Procedure Laterality Date   COLON SURGERY  2004   Cancer   COSMETIC SURGERY  2021   Skin cancer   ESOPHAGEAL VARICE LIGATION     EYE SURGERY     HEMICOLECTOMY  01/08/2003   IR RADIOLOGIST EVAL & MGMT  12/20/2020   IR RADIOLOGIST EVAL & MGMT  05/29/2021   LIVER SURGERY     shunts placed after chemo complication   SKIN FULL THICKNESS GRAFT N/A 09/12/2019   Procedure: debridement and FTSG to the nose from left upper arm;  Surgeon: Allena Napoleon, MD;  Location: [facility];  Service: Plastics;   Laterality: N/A;  2 hours, please   TIPS PROCEDURE     Patient Active Problem List   Diagnosis Date Noted   Bradycardia 05/07/2023   Sepsis due to group B Streptococcus with acute renal failure and septic shock (HCC) 04/24/2023   Septic shock (HCC) 04/21/2023   Adrenal insufficiency (HCC) 03/19/2023   Protein-calorie malnutrition, severe (HCC) 02/26/2023   Acute hepatic encephalopathy (HCC) 02/25/2023   Weight loss 02/24/2023   Bowel habit changes 02/24/2023   Generalized abdominal pain 02/24/2023   Hospital discharge follow-up 02/24/2023   Weakness 01/04/2023   Frequent UTI 05/18/2022   Anemia 05/18/2022   AKI (acute kidney injury) (HCC) 04/30/2022   Tachycardia-bradycardia syndrome (HCC) 02/05/2022   Hepatic encephalopathy (HCC) 02/03/2022   Basal cell carcinoma (BCC) 05/06/2021   Cecal cancer (HCC) 05/06/2021   Hypertension 05/06/2021   COVID-19 vaccination declined 01/30/2021   Decompensated liver cirrhosis with portal HTN and gastric varices 12/04/2020   Essential hypertension 08/12/2020   Murmur, cardiac 03/19/2020   Hypothyroidism 06/30/2019   History of basal cell cancer 06/30/2019   Hx of colon cancer, stage III 11/30/2011   Esophageal varices (HCC) 06/12/2010   Portal  hypertension (HCC) 01/23/2008    ONSET DATE: 01/29/2023  REFERRING DIAG: fracture of distal end of radius left  THERAPY DIAG:  Muscle weakness (generalized)  Stiffness of left wrist, not elsewhere classified  Other lack of coordination  Rationale for Evaluation and Treatment: Rehabilitation  SUBJECTIVE:  SUBJECTIVE STATEMENT: Patient reports her wrist has been okay and she is doing more without the brace at home but still wearing it in public and at night for support.  Return to orthopedist 06/28/23  Pt accompanied by: significant other - [name]  PERTINENT HISTORY:  Closed, L distal radius fracture, managing conservatively due to health issues. She is not a candidate for surgery.  Follows up  with ortho 06/28/2023. Referred to OPOT for eval and treat.    PRECAUTIONS: Other: to tolerance  - wear brace with strenuous activity  WEIGHT BEARING RESTRICTIONS:  to tolerance LUE  PAIN:  Are you having pain? No  FALLS: Has patient fallen in last 6 months? Yes. 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 navy officer and son-in Social worker is Runner, broadcasting/film/video); was driving   PATIENT GOALS: return to [employer]   OBJECTIVE:   HAND DOMINANCE: Right  ADLs: Overall ADLs: mod I  MOBILITY STATUS: Needs Assist: use of cane  ACTIVITY TOLERANCE: Activity tolerance: fair  FUNCTIONAL OUTCOME MEASURES: Quick Dash: 29.5% disability  - See image/specific items on eval 05/12/23  UPPER EXTREMITY ROM:     AROM Right (eval) Left (eval)  Shoulder flexion WNL   Shoulder abduction WNL   Elbow flexion WNL   Elbow extension WNL   Wrist flexion WNL 43*  Wrist extension WNL 39*  Wrist pronation WNL   Wrist supination WNL    Digit Composite Flexion WNL Lacks 4 cm index; 5 cm middle; 4 cm ring; 3.5 cm  Digit Composite Extension WNL   Digit Opposition WNL WFL  (Blank rows = not tested)  RUE IR limited to just behind hip  UPPER EXTREMITY MMT:     BUE: WFL shoulders and elbows  HAND FUNCTION: Grip strength: Right: 24.4 lbs; Left: 7.2 lbs  COORDINATION: 9 Hole Peg test: Right: 28 sec; Left: 38 sec  SENSATION: WFL  EDEMA: none reported or observed  COGNITION: Overall cognitive status: History of cognitive impairments - at baseline  OBSERVATIONS: Pt appears well-kept. Ambulates with cane. No LOB though slow, altered  gait.   TODAY'S TREATMENT:                                                                                                                               Therapeutic Exercises  Patient assisted  with A/AROM of L UE wrist and digits to isolate motions for L wrist extension > 40 degrees and moving fingertips to palm for tighter grip.  Introduced Theraputty exercises s/p difficulty with squeezing a foam ball but success with NeeDoh  solid squishy gumdrop.   Reviewed and practiced exercises as listed below and printed form [employer] for patient.  - Putty Squeezes  - 2 x daily - 2 sets - 10 reps - cues to engaged index all fingers in flexion and then for in-hand manipulation to fold or turn putty to continue to squeeze it into a log for use with other exercises   - Tip PUSH with Putty  - 2 x daily - 2 sets - 10 reps - cues to work on individual tip to tip pinches for each finger as able  - 3-Point Pinch with Putty  - 2 x daily - 2 sets - 10 reps - encouraged to work on tripod grasp pulling towards herself to try and increase wrist extension   - Finger Pinch and Pull with Putty  - 2 x daily - 2 sets - 10 reps - patient encouraged to combine tripod and/or pinch of index finger with "pinch and pull" motion of putty (again progressing from tripod to individual pinch as able    Therapeutic Activities  The patient is given handout and instruction re: use of applying heat intermittently to help with ROM exercises and stretching due to positive results of fluidotherapy last week.   Coordination Activities conducted to promote motor control and ROM of L UE including balancing a plastic plate on her supinated hand to rotate a ball in different directions ie) clockwise and counter clockwise.  She needs extra time achieve position to support the plate initially and tennis ball was switched for a lighter foam ball to increased comfort and success with task with minimal discomfort.  She is encourage to relax  her shoulder, minimize compensatory motions and a try different activities throughout the week.  PATIENT EDUCATION: Education details: Putty Exercises and [employer]. Person educated: Patient and Spouse Education method: Explanation, Demonstration, Tactile cues, Verbal cues, and Handouts Education comprehension: verbalized understanding, returned demonstration, verbal cues required, tactile cues required, and needs further education  HOME EXERCISE PROGRAM: 05/17/23 - Wrist ROM Access Code: [facility]  05/19/23 - Coordination Activities Handout with images  05/24/23 - Intro Putty Exercises Access Code: [email or web address]   GOALS:  SHORT TERM GOALS: Target date: 06/09/2023    Pt will demonstrate full composite flexion of L digits.  Baseline: Lacks 4 cm index; 5 cm middle; 4 cm ring; 3.5 cm Goal status: IN PROGRESS  2.  Pt will demonstrate L internal rotation to middle of spine to aid with ADL completion. Baseline: just behind hip Goal status: IN PROGRESS   LONG TERM GOALS: Target date: 06/25/2023    Patient will demonstrate LUE HEP with 25% verbal cues or less for proper execution. Baseline:  Goal status: IN PROGRESS  2.  Patient will demonstrate at least 16% improvement with quick Dash score (reporting 13.5% disability or less) indicating improved functional use of affected extremity (opening containers, washing back, carrying objects/bags). Baseline: 29.5% disability with use of LUE Goal status: IN PROGRESS  3.  Pt will demonstrate at least 120* combined L wrist ext/flex AROM.  Baseline: L wrist ext - 39*; flex - 43* Goal status: IN PROGRESS  4.  Patient will demonstrate at least 17.2 lbs L grip strength as needed to open jars and other containers. Baseline:  7.2 lbs Goal status: IN PROGRESS  5.  Patient will complete nine-hole peg with use of L in 30 seconds or less. Baseline: 38 seconds Goal status: IN PROGRESS   ASSESSMENT:  CLINICAL IMPRESSION: Patient is seen today for  occupational therapy today for UE limitations s/p L distal radius fracture. Patient arrived with her brace on and spouse confirms increased times without the brace at home other than night and outings. Patient' provided info re: heat as an option at home s/p significant improvement in wrist ROM s/p Fluidotherapy last week.  She was provided putty HEP to work on with encouragement to combine putty with wrist ROM also.  Pt continues to benefit from skilled OT services to work on ROM and strength limitations in L ROM in order to return to consistent use of LUE with daily activities.     PERFORMANCE DEFICITS: in functional skills including ADLs, IADLs, coordination, ROM, strength, pain, Fine motor control, and UE functional use, cognitive skills including memory and thought  IMPAIRMENTS: are limiting patient from ADLs, IADLs, and work.   CO-MORBIDITIES: may have co-morbidities  that affects occupational performance. Patient will benefit from skilled OT to address above impairments and improve overall function.  REHAB POTENTIAL: Good   PLAN:  OT FREQUENCY: 2x/week  OT DURATION: 6 weeks  PLANNED INTERVENTIONS: self care/ADL training, therapeutic exercise, therapeutic activity, manual therapy, passive range of motion, splinting, ultrasound, paraffin, fluidotherapy, moist heat, contrast bath, patient/family education, DME and/or AE instructions, and Re-evaluation  RECOMMENDED OTHER SERVICES: none at this time  CONSULTED AND AGREED WITH PLAN OF CARE: Patient and family member/caregiver  PLAN FOR NEXT SESSION:   Progress HEPs for LUE AROM, strength (check PUTTY ex issued ) and coordination.  Modalities (fluidotherapy/US) for progressing P/AROM and stretching wrist .    Victorino Sparrow, OT 05/24/2023, 3:48 PM

## 2023-05-26 ENCOUNTER — Ambulatory Visit: Payer: Medicare Other | Admitting: Occupational Therapy

## 2023-05-26 DIAGNOSIS — R278 Other lack of coordination: Secondary | ICD-10-CM | POA: Diagnosis not present

## 2023-05-26 DIAGNOSIS — M25632 Stiffness of left wrist, not elsewhere classified: Secondary | ICD-10-CM

## 2023-05-26 DIAGNOSIS — R5381 Other malaise: Secondary | ICD-10-CM

## 2023-05-26 DIAGNOSIS — M6281 Muscle weakness (generalized): Secondary | ICD-10-CM

## 2023-05-26 DIAGNOSIS — R29898 Other symptoms and signs involving the musculoskeletal system: Secondary | ICD-10-CM | POA: Diagnosis not present

## 2023-05-26 DIAGNOSIS — R2681 Unsteadiness on feet: Secondary | ICD-10-CM | POA: Diagnosis not present

## 2023-05-26 NOTE — Therapy (Signed)
OUTPATIENT OCCUPATIONAL THERAPY ORTHO TREATMENT  Patient Name: [name] MRN: [id number] DOB:[birth year], 72 y.o., female Today's Date: 05/26/2023  PCP: Eden Emms, NP  REFERRING PROVIDER: Marlyne Beards, MD  END OF SESSION:  OT End of Session - 05/26/23 1624     Visit Number 5    Number of Visits 13    Date for OT Re-Evaluation 06/25/23    Authorization Type [employer] Medicare    Progress Note Due on Visit 10    OT Start Time 1623    OT Stop Time 1701    OT Time Calculation (min) 38 min    Activity Tolerance Patient tolerated treatment well;No increased pain    Behavior During Therapy [facility] for tasks assessed/performed             Past Medical History:  Diagnosis Date   Acute urinary retention 05/04/2022   Allergy 2006 ?   Contrast dye   Arthritis 2016 ??   Knees and thumb   Cancer (HCC)    cecum   Cataract 2021   Surgery scheduled July 2023   Colon cancer [facility]) 2003   Elevated liver function tests    Esophageal varices (HCC)    Heart murmur On file   Hemorrhage of gastrointestinal tract 05/04/2011   Hypertension 2021   Hypothyroidism    Iron deficiency anemia    Liver disease    chemotherapy complication, per pt, shunts placed to bypass liver   Malignant neoplasm of cecum (HCC)    Portal hypertension (HCC)    Skin cancer 2019   Splenomegaly    Past Surgical History:  Procedure Laterality Date   COLON SURGERY  2004   Cancer   COSMETIC SURGERY  2021   Skin cancer   ESOPHAGEAL VARICE LIGATION     EYE SURGERY     HEMICOLECTOMY  01/08/2003   IR RADIOLOGIST EVAL & MGMT  12/20/2020   IR RADIOLOGIST EVAL & MGMT  05/29/2021   LIVER SURGERY     shunts placed after chemo complication   SKIN FULL THICKNESS GRAFT N/A 09/12/2019   Procedure: debridement and FTSG to the nose from left upper arm;  Surgeon: Allena Napoleon, MD;  Location: [facility];  Service: Plastics;  Laterality: N/A;  2 hours, please   TIPS PROCEDURE     Patient Active  Problem List   Diagnosis Date Noted   Bradycardia 05/07/2023   Sepsis due to group B Streptococcus with acute renal failure and septic shock (HCC) 04/24/2023   Septic shock (HCC) 04/21/2023   Adrenal insufficiency (HCC) 03/19/2023   Protein-calorie malnutrition, severe (HCC) 02/26/2023   Acute hepatic encephalopathy (HCC) 02/25/2023   Weight loss 02/24/2023   Bowel habit changes 02/24/2023   Generalized abdominal pain 02/24/2023   Hospital discharge follow-up 02/24/2023   Weakness 01/04/2023   Frequent UTI 05/18/2022   Anemia 05/18/2022   AKI (acute kidney injury) (HCC) 04/30/2022   Tachycardia-bradycardia syndrome (HCC) 02/05/2022   Hepatic encephalopathy (HCC) 02/03/2022   Basal cell carcinoma (BCC) 05/06/2021   Cecal cancer (HCC) 05/06/2021   Hypertension 05/06/2021   COVID-19 vaccination declined 01/30/2021   Decompensated liver cirrhosis with portal HTN and gastric varices 12/04/2020   Essential hypertension 08/12/2020   Murmur, cardiac 03/19/2020   Hypothyroidism 06/30/2019   History of basal cell cancer 06/30/2019   Hx of colon cancer, stage III 11/30/2011   Esophageal varices (HCC) 06/12/2010   Portal hypertension (HCC) 01/23/2008    ONSET DATE: 01/29/2023  REFERRING DIAG:  fracture of distal end of radius left  THERAPY DIAG:  Muscle weakness (generalized)  Stiffness of left wrist, not elsewhere classified  Other lack of coordination  Physical deconditioning  Rationale for Evaluation and Treatment: Rehabilitation  SUBJECTIVE:  SUBJECTIVE STATEMENT: Patient cut her L forearm (bandage applied) when reaching into the fridge to get cherries out.   Return to orthopedist 06/28/23  Pt accompanied by: significant other - [name]  PERTINENT HISTORY:  Closed, L distal radius fracture, managing conservatively due to health issues. She is not a candidate for surgery.  Follows up with ortho 06/28/2023. Referred to OPOT for eval and treat.    PRECAUTIONS: Other: to  tolerance  - wear brace with strenuous activity  WEIGHT BEARING RESTRICTIONS:  to tolerance LUE  PAIN:  Are you having pain? No  FALLS: Has patient fallen in last 6 months? Yes. 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 navy officer and son-in Social worker is Runner, broadcasting/film/video); was driving   PATIENT GOALS: return to [employer]   OBJECTIVE:   HAND DOMINANCE: Right  ADLs: Overall ADLs: mod I  MOBILITY STATUS: Needs Assist: use of cane  ACTIVITY TOLERANCE: Activity tolerance: fair  FUNCTIONAL OUTCOME MEASURES: Quick Dash: 29.5% disability  - See image/specific items on eval 05/12/23  UPPER EXTREMITY ROM:     AROM Right (eval) Left (eval) Left 7/3  Shoulder flexion WNL    Shoulder abduction WNL    Elbow flexion WNL    Elbow extension WNL    Wrist flexion WNL 43* 50*  Wrist extension WNL 39* 38*  Wrist pronation WNL    Wrist supination WNL     Digit Composite Flexion WNL Lacks 4 cm index; 5 cm middle; 4 cm ring; 3.5 cm pinky Lacks 2 cm index; 3 cm middle;  ring and pinky - full  Digit Composite Extension WNL    Digit Opposition WNL WFL WNL  (Blank rows = not tested)  RUE IR limited to just behind hip  UPPER EXTREMITY MMT:     BUE: WFL shoulders and elbows  HAND FUNCTION: Grip strength: Right: 24.4 lbs; Left: 7.2 lbs  COORDINATION: 9 Hole Peg test: Right: 28 sec; Left: 38 sec  SENSATION: WFL  EDEMA: none reported or observed  COGNITION: Overall cognitive status: History of cognitive impairments - at baseline  OBSERVATIONS: Pt appears well-kept. Ambulates with cane. No LOB though slow, altered  gait.   TODAY'S TREATMENT:                                                                                                                               -  Therapeutic Exercises  Objective measures assessed as noted in Goals section to determine progression towards goals. Wrist jux-a-cisor with use of left with cues to keep full grasp on base at all times x5 for improved wrist ROM and grip strength of affected extremity With use of wheel, patient completed pronation and supination roll of LUE for 2 minute to promote improved ROM of affected extremity. Using red Power Web, patient stretched wrist in both flexion and extension for 10 reps each to help with ROM and strength using L hand.  With use of supination/pronation wheel, pt completed L wrist extension and flexion stretch x 2 min each to promote improved ROM through affected extremity.  Therapist completed L wrist distraction, PNF facilitation into extension, and PROM to facilitate improved ROM. Therapist also completed distraction and PNF facilitation into flex of digits 2 and 3.   PATIENT EDUCATION: Education details: LUE ROM  Person educated: Patient and Spouse Education method: Explanation, Demonstration, Tactile cues, and Verbal cues Education comprehension: verbalized understanding, returned demonstration, verbal cues required, tactile cues required, and needs further education  HOME EXERCISE PROGRAM: 05/17/23 - Wrist ROM Access Code: [facility]  05/19/23 - Coordination Activities Handout with images  05/24/23 - Intro Putty Exercises Access Code: [email or web address]   GOALS:  SHORT TERM GOALS: Target date: 06/09/2023    Pt will demonstrate full composite flexion of L digits.  Baseline: Lacks 4 cm index; 5 cm middle; 4 cm ring; 3.5 cm Goal status: IN PROGRESS  2.  Pt will demonstrate L internal rotation to middle of spine to aid with ADL completion. Baseline: just behind hip Goal status: IN PROGRESS   LONG TERM GOALS: Target date:  06/25/2023    Patient will demonstrate LUE HEP with 25% verbal cues or less for proper execution. Baseline:  Goal status: IN PROGRESS  2.  Patient will demonstrate at least 16% improvement with quick Dash score (reporting 13.5% disability or less) indicating improved functional use of affected extremity (opening containers, washing back, carrying objects/bags). Baseline: 29.5% disability with use of LUE Goal status: IN PROGRESS  3.  Pt will demonstrate at least 120* combined L wrist ext/flex AROM.  Baseline: L wrist ext - 39*; flex - 43* Goal status: IN PROGRESS  4.  Patient will demonstrate at least 17.2 lbs L grip strength as needed to open jars and other containers. Baseline: 7.2 lbs Goal status: IN PROGRESS  5.  Patient will complete nine-hole peg with use of L in 30 seconds or less. Baseline: 38 seconds Goal status: IN PROGRESS   ASSESSMENT:  CLINICAL IMPRESSION: Pt demonstrates good progression towards goals. Recommend pt continue to focus on L wrist ext and digit flexion before further strengthening.   PERFORMANCE DEFICITS: in functional skills including ADLs, IADLs, coordination, ROM, strength, pain, Fine motor control, and UE functional use, cognitive skills including memory and thought  IMPAIRMENTS: are limiting patient from ADLs, IADLs, and work.   CO-MORBIDITIES: may have co-morbidities  that affects occupational performance. Patient will benefit from skilled OT to address above impairments and improve overall function.  REHAB POTENTIAL: Good   PLAN:  OT FREQUENCY: 2x/week  OT DURATION: 6 weeks  PLANNED INTERVENTIONS: self care/ADL training, therapeutic exercise, therapeutic activity, manual therapy, passive range of motion, splinting, ultrasound, paraffin, fluidotherapy, moist heat, contrast bath, patient/family education, DME and/or AE instructions, and Re-evaluation  RECOMMENDED OTHER SERVICES: none at this time  CONSULTED AND AGREED WITH PLAN OF CARE:  Patient and family member/caregiver  PLAN FOR NEXT SESSION:   Progress  HEPs for LUE AROM, strength (check PUTTY ex issued if she brings) and coordination.  Modalities (fluidotherapy ONLY if skin on L forearm intact/US) for progressing P/AROM and stretching wrist .   Delana Meyer, OT 05/26/2023, 5:30 PM

## 2023-05-28 ENCOUNTER — Other Ambulatory Visit: Payer: Self-pay

## 2023-05-31 ENCOUNTER — Ambulatory Visit: Payer: Medicare Other | Admitting: Occupational Therapy

## 2023-05-31 DIAGNOSIS — R278 Other lack of coordination: Secondary | ICD-10-CM | POA: Diagnosis not present

## 2023-05-31 DIAGNOSIS — R29898 Other symptoms and signs involving the musculoskeletal system: Secondary | ICD-10-CM | POA: Diagnosis not present

## 2023-05-31 DIAGNOSIS — R5381 Other malaise: Secondary | ICD-10-CM | POA: Diagnosis not present

## 2023-05-31 DIAGNOSIS — R2681 Unsteadiness on feet: Secondary | ICD-10-CM | POA: Diagnosis not present

## 2023-05-31 DIAGNOSIS — M25632 Stiffness of left wrist, not elsewhere classified: Secondary | ICD-10-CM | POA: Diagnosis not present

## 2023-05-31 DIAGNOSIS — M6281 Muscle weakness (generalized): Secondary | ICD-10-CM

## 2023-05-31 NOTE — Therapy (Signed)
OUTPATIENT OCCUPATIONAL THERAPY ORTHO TREATMENT  Patient Name: [name] MRN: [id number] DOB:July 24, 1951, 72 y.o., female Today's Date: 05/31/2023  PCP: Eden Emms, NP  REFERRING PROVIDER: Marlyne Beards, MD  END OF SESSION:  OT End of Session - 05/31/23 1451     Visit Number 6    Number of Visits 13    Date for OT Re-Evaluation 06/25/23    Authorization Type U[employer] Medicare    Progress Note Due on Visit 10    OT Start Time 1450    OT Stop Time 1530    OT Time Calculation (min) 40 min    Activity Tolerance Patient tolerated treatment well;No increased pain    Behavior During Therapy [facility] for tasks assessed/performed             Past Medical History:  Diagnosis Date   Acute urinary retention 05/04/2022   Allergy 2006 ?   Contrast dye   Arthritis 2016 ??   Knees and thumb   Cancer (HCC)    cecum   Cataract 2021   Surgery scheduled July 2023   Colon cancer [facility]) 2003   Elevated liver function tests    Esophageal varices (HCC)    Heart murmur On file   Hemorrhage of gastrointestinal tract 05/04/2011   Hypertension 2021   Hypothyroidism    Iron deficiency anemia    Liver disease    chemotherapy complication, per pt, shunts placed to bypass liver   Malignant neoplasm of cecum (HCC)    Portal hypertension (HCC)    Skin cancer 2019   Splenomegaly    Past Surgical History:  Procedure Laterality Date   COLON SURGERY  2004   Cancer   COSMETIC SURGERY  2021   Skin cancer   ESOPHAGEAL VARICE LIGATION     EYE SURGERY     HEMICOLECTOMY  01/08/2003   IR RADIOLOGIST EVAL & MGMT  12/20/2020   IR RADIOLOGIST EVAL & MGMT  05/29/2021   LIVER SURGERY     shunts placed after chemo complication   SKIN FULL THICKNESS GRAFT N/A 09/12/2019   Procedure: debridement and FTSG to the nose from left upper arm;  Surgeon: Allena Napoleon, MD;  Location: [facility];  Service: Plastics;  Laterality: N/A;  2 hours, please   TIPS PROCEDURE     Patient Active  Problem List   Diagnosis Date Noted   Bradycardia 05/07/2023   Sepsis due to group B Streptococcus with acute renal failure and septic shock (HCC) 04/24/2023   Septic shock (HCC) 04/21/2023   Adrenal insufficiency (HCC) 03/19/2023   Protein-calorie malnutrition, severe (HCC) 02/26/2023   Acute hepatic encephalopathy (HCC) 02/25/2023   Weight loss 02/24/2023   Bowel habit changes 02/24/2023   Generalized abdominal pain 02/24/2023   Hospital discharge follow-up 02/24/2023   Weakness 01/04/2023   Frequent UTI 05/18/2022   Anemia 05/18/2022   AKI (acute kidney injury) (HCC) 04/30/2022   Tachycardia-bradycardia syndrome (HCC) 02/05/2022   Hepatic encephalopathy (HCC) 02/03/2022   Basal cell carcinoma (BCC) 05/06/2021   Cecal cancer (HCC) 05/06/2021   Hypertension 05/06/2021   COVID-19 vaccination declined 01/30/2021   Decompensated liver cirrhosis with portal HTN and gastric varices 12/04/2020   Essential hypertension 08/12/2020   Murmur, cardiac 03/19/2020   Hypothyroidism 06/30/2019   History of basal cell cancer 06/30/2019   Hx of colon cancer, stage III 11/30/2011   Esophageal varices (HCC) 06/12/2010   Portal hypertension (HCC) 01/23/2008    ONSET DATE: 01/29/2023  REFERRING DIAG:  fracture of distal end of radius left  THERAPY DIAG:  Muscle weakness (generalized)  Stiffness of left wrist, not elsewhere classified  Other lack of coordination  Physical deconditioning  Other symptoms and signs involving the musculoskeletal system  Rationale for Evaluation and Treatment: Rehabilitation  SUBJECTIVE:  SUBJECTIVE STATEMENT: Pt's husband reports her fist continues to improve. Some R shoulder discomfort reported with use of clips.   Return to orthopedist 06/28/23  Pt accompanied by: significant other - [name]  PERTINENT HISTORY:  Closed, L distal radius fracture, managing conservatively due to health issues. She is not a candidate for surgery.  Follows up with ortho  06/28/2023. Referred to OPOT for eval and treat.    PRECAUTIONS: Other: to tolerance  - wear brace with strenuous activity  WEIGHT BEARING RESTRICTIONS:  to tolerance LUE  PAIN:  Are you having pain? No  FALLS: Has patient fallen in last 6 months? Yes. 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 navy officer and son-in Social worker is Runner, broadcasting/film/video); was driving   PATIENT GOALS: return to [employer]   OBJECTIVE:   HAND DOMINANCE: Right  ADLs: Overall ADLs: mod I  MOBILITY STATUS: Needs Assist: use of cane  ACTIVITY TOLERANCE: Activity tolerance: fair  FUNCTIONAL OUTCOME MEASURES: Quick Dash: 29.5% disability  - See image/specific items on eval 05/12/23  UPPER EXTREMITY ROM:     AROM Right (eval) Left (eval) Left 7/3  Shoulder flexion WNL    Shoulder abduction WNL    Elbow flexion WNL    Elbow extension WNL    Wrist flexion WNL 43* 50*  Wrist extension WNL 39* 38*  Wrist pronation WNL    Wrist supination WNL     Digit Composite Flexion WNL Lacks 4 cm index; 5 cm middle; 4 cm ring; 3.5 cm pinky Lacks 2 cm index; 3 cm middle;  ring and pinky - full  Digit Composite Extension WNL    Digit Opposition WNL WFL WNL  (Blank rows = not tested)  RUE IR limited to just behind hip  UPPER EXTREMITY MMT:     BUE: WFL shoulders and elbows  HAND FUNCTION: Grip strength: Right: 24.4 lbs; Left: 7.2 lbs  COORDINATION: 9 Hole Peg test: Right: 28 sec; Left: 38 sec  SENSATION: WFL  EDEMA: none reported or observed  COGNITION: Overall cognitive status: History of cognitive impairments - at  baseline  OBSERVATIONS: Pt appears well-kept. Ambulates with cane. No LOB though slow, altered gait.   TODAY'S TREATMENT:                                                                                                                               -  Therapeutic Exercises  Reviewed putty HEP for LUE requiring min to mod cueing for strengthening and ROM.   Placement and removal of yellow, red, green, blue, and black resistive clips with use of left 3 point pinch for strengthening of affected extremity. Placement and removal of yellow, red, and green resistive clips with use of left 2 point pinch for strengthening of affected extremity. Pt completed arm bike in sitting for 5 minutes with average RPM of 30 for endurance, ROM, and strengthening of affected extremity. Pt alternating direction of pedaling halfway through. Intermittent cues provided to maintain stability with respect to anterior/posterior trunk lean and consistent grasp maintenance.  PATIENT EDUCATION: Education details: LUE ROM light strengthening Person educated: Patient and Spouse Education method: Explanation, Demonstration, Tactile cues, and Verbal cues Education comprehension: verbalized understanding, returned demonstration, verbal cues required, tactile cues required, and needs further education  HOME EXERCISE PROGRAM: 05/17/23 - Wrist ROM Access Code: [facility]  05/19/23 - Coordination Activities Handout with images  05/24/23 - Intro Putty Exercises Access Code: [email or web address]   GOALS:  SHORT TERM GOALS: Target date: 06/09/2023    Pt will demonstrate full composite flexion of L digits.  Baseline: Lacks 4 cm index; 5 cm middle; 4 cm ring; 3.5 cm Goal status: IN PROGRESS  2.  Pt will demonstrate L internal rotation to middle of spine to aid with ADL completion. Baseline: just behind hip Goal status: IN PROGRESS   LONG TERM GOALS: Target date: 06/25/2023    Patient will demonstrate LUE HEP with 25% verbal cues or less  for proper execution. Baseline:  Goal status: IN PROGRESS  2.  Patient will demonstrate at least 16% improvement with quick Dash score (reporting 13.5% disability or less) indicating improved functional use of affected extremity (opening containers, washing back, carrying objects/bags). Baseline: 29.5% disability with use of LUE Goal status: IN PROGRESS  3.  Pt will demonstrate at least 120* combined L wrist ext/flex AROM.  Baseline: L wrist ext - 39*; flex - 43* Goal status: IN PROGRESS  4.  Patient will demonstrate at least 17.2 lbs L grip strength as needed to open jars and other containers. Baseline: 7.2 lbs Goal status: IN PROGRESS  5.  Patient will complete nine-hole peg with use of L in 30 seconds or less. Baseline: 38 seconds Goal status: IN PROGRESS   ASSESSMENT:  CLINICAL IMPRESSION: Pt continues to improve with L wrist and digit AROM. As skin on L forearm is not fully healed, deferring fluido this session. Will continue to monitor appropriate thermal modality.   PERFORMANCE DEFICITS: in functional skills including ADLs, IADLs, coordination, ROM, strength, pain, Fine motor control, and UE functional use, cognitive skills including memory and thought  IMPAIRMENTS: are limiting patient from ADLs, IADLs, and work.   CO-MORBIDITIES: may have co-morbidities  that affects occupational performance. Patient will benefit from skilled OT to address above impairments and improve overall function.  REHAB POTENTIAL: Good   PLAN:  OT FREQUENCY: 2x/week  OT DURATION: 6 weeks  PLANNED INTERVENTIONS: self care/ADL training, therapeutic exercise, therapeutic activity, manual therapy, passive range of motion, splinting, ultrasound, paraffin, fluidotherapy, moist heat, contrast bath, patient/family education, DME and/or AE instructions, and Re-evaluation  RECOMMENDED OTHER SERVICES: none at this time  CONSULTED AND AGREED WITH PLAN OF CARE: Patient and family  member/caregiver  PLAN FOR NEXT SESSION: [place]  Progress HEPs for LUE AROM, strength, and coordination.  Modalities (fluidotherapy ONLY if skin on L forearm intact/US) for progressing P/AROM and stretching wrist .   Jennye Boroughs  Allene Dillon, OT 05/31/2023, 3:41 PM

## 2023-06-01 ENCOUNTER — Ambulatory Visit: Payer: Medicare Other

## 2023-06-01 DIAGNOSIS — R5381 Other malaise: Secondary | ICD-10-CM

## 2023-06-01 DIAGNOSIS — M6281 Muscle weakness (generalized): Secondary | ICD-10-CM

## 2023-06-01 DIAGNOSIS — R278 Other lack of coordination: Secondary | ICD-10-CM

## 2023-06-01 DIAGNOSIS — R2681 Unsteadiness on feet: Secondary | ICD-10-CM | POA: Diagnosis not present

## 2023-06-01 DIAGNOSIS — R29898 Other symptoms and signs involving the musculoskeletal system: Secondary | ICD-10-CM | POA: Diagnosis not present

## 2023-06-01 DIAGNOSIS — M25632 Stiffness of left wrist, not elsewhere classified: Secondary | ICD-10-CM | POA: Diagnosis not present

## 2023-06-01 NOTE — Therapy (Signed)
OUTPATIENT PHYSICAL THERAPY TREATMENT NOTE   Patient Name: [name] MRN: [id number] DOB:09-Dec-1950, 72 y.o., female Today's Date: 06/01/2023  PCP: Eden Emms, NP  REFERRING PROVIDER: Eden Emms, NP   END OF SESSION:   PT End of Session - 06/01/23 1211     Visit Number 11    Number of Visits 12    Date for PT Re-Evaluation 06/01/23    Authorization Type UHC MCR    PT Start Time 1215    PT Stop Time 1255    PT Time Calculation (min) 40 min    Activity Tolerance Patient limited by pain;Treatment limited secondary to medical complications (Comment)    Behavior During Therapy [facility] for tasks assessed/performed              Past Medical History:  Diagnosis Date   Acute urinary retention 05/04/2022   Allergy 2006 ?   Contrast dye   Arthritis 2016 ??   Knees and thumb   Cancer (HCC)    cecum   Cataract 2021   Surgery scheduled July 2023   Colon cancer [facility]) 2003   Elevated liver function tests    Esophageal varices (HCC)    Heart murmur On file   Hemorrhage of gastrointestinal tract 05/04/2011   Hypertension 2021   Hypothyroidism    Iron deficiency anemia    Liver disease    chemotherapy complication, per pt, shunts placed to bypass liver   Malignant neoplasm of cecum (HCC)    Portal hypertension (HCC)    Skin cancer 2019   Splenomegaly    Past Surgical History:  Procedure Laterality Date   COLON SURGERY  2004   Cancer   COSMETIC SURGERY  2021   Skin cancer   ESOPHAGEAL VARICE LIGATION     EYE SURGERY     HEMICOLECTOMY  01/08/2003   IR RADIOLOGIST EVAL & MGMT  12/20/2020   IR RADIOLOGIST EVAL & MGMT  05/29/2021   LIVER SURGERY     shunts placed after chemo complication   SKIN FULL THICKNESS GRAFT N/A 09/12/2019   Procedure: debridement and FTSG to the nose from left upper arm;  Surgeon: Allena Napoleon, MD;  Location: [facility];  Service: Plastics;  Laterality: N/A;  2 hours, please   TIPS PROCEDURE     Patient Active Problem  List   Diagnosis Date Noted   Bradycardia 05/07/2023   Sepsis due to group B Streptococcus with acute renal failure and septic shock (HCC) 04/24/2023   Septic shock (HCC) 04/21/2023   Adrenal insufficiency (HCC) 03/19/2023   Protein-calorie malnutrition, severe (HCC) 02/26/2023   Acute hepatic encephalopathy (HCC) 02/25/2023   Weight loss 02/24/2023   Bowel habit changes 02/24/2023   Generalized abdominal pain 02/24/2023   Hospital discharge follow-up 02/24/2023   Weakness 01/04/2023   Frequent UTI 05/18/2022   Anemia 05/18/2022   AKI (acute kidney injury) (HCC) 04/30/2022   Tachycardia-bradycardia syndrome (HCC) 02/05/2022   Hepatic encephalopathy (HCC) 02/03/2022   Basal cell carcinoma (BCC) 05/06/2021   Cecal cancer (HCC) 05/06/2021   Hypertension 05/06/2021   COVID-19 vaccination declined 01/30/2021   Decompensated liver cirrhosis with portal HTN and gastric varices 12/04/2020   Essential hypertension 08/12/2020   Murmur, cardiac 03/19/2020   Hypothyroidism 06/30/2019   History of basal cell cancer 06/30/2019   Hx of colon cancer, stage III 11/30/2011   Esophageal varices (HCC) 06/12/2010   Portal hypertension (HCC) 01/23/2008    REFERRING DIAG: R53.1 (ICD-10-CM) - Weakness  THERAPY DIAG:  Muscle weakness (generalized)  Other lack of coordination  Physical deconditioning  Rationale for Evaluation and Treatment Rehabilitation  PERTINENT HISTORY:  See PMH  PRECAUTIONS: Fall  SUBJECTIVE:                                                                                                                                                                                      SUBJECTIVE STATEMENT: Patient reports continued Rt sided LBP that is limiting her mobility.   PAIN:  Are you having pain? Yes (chronic LBP)   OBJECTIVE: (objective measures completed at initial evaluation unless otherwise dated)   DIAGNOSTIC FINDINGS: none   PATIENT SURVEYS:  FOTO 61(68  predicted); 04/28/23 45% 05/20/23 49%   MUSCLE LENGTH: Not tested   POSTURE: rounded shoulders, forward head, and increased thoracic kyphosis   PALPATION: Deferred based on Dx   LOWER EXTREMITY ROM: WFL for transfers and gait   Active ROM Right eval Left eval  Hip flexion      Hip extension      Hip abduction      Hip adduction      Hip internal rotation      Hip external rotation      Knee flexion      Knee extension      Ankle dorsiflexion      Ankle plantarflexion      Ankle inversion      Ankle eversion       (Blank rows = not tested)   LOWER EXTREMITY MMT:   MMT Right eval Left eval R 04/28/23 L 6//24  Hip flexion 4- 4- 3+ 3+  Hip extension 4- 4- 3+ 3+  Hip abduction 4- 4- 3+ 3+  Hip adduction        Hip internal rotation        Hip external rotation        Knee flexion 4- 4- 3+ 3+  Knee extension 4- 4- 3+ 3+  Ankle dorsiflexion        Ankle plantarflexion 4- 4- 3 3  Ankle inversion        Ankle eversion         (Blank rows = not tested)   LOWER EXTREMITY SPECIAL TESTS:  Deferred    FUNCTIONAL TESTS:  30 seconds chair stand test 7 stands with UE assist 04/28/23 4 stands with UE support 05/20/23 7 stands with UE Support   GAIT: Distance walked: 77ftx2 Assistive device utilized: Single point cane; 04/28/23 RW Level of assistance: SBA Comments: slow cadence,    Vital signs:  O2 sats 96% HR 58   TODAY'S TREATMENT:  [facility] Adult PT Treatment:                                                DATE: 06/01/23 Therapeutic Exercise: FAQs 2# with adduction 15x  FAQs 2# alternating 15/15 Seated hip adduction x15 Seated march 2# 3x30" Standing hamstring curls 2# x15 BIL Standing heel raises 15x Alt. shoulder flexion/hip extension on wall 10/10 Nustep L1 6 min STS 2x5   [facility] Adult PT Treatment:                                                DATE: 05/20/23 HR 70 O2 sats 95%  Therapeutic Exercise: FAQs 2# with adduction 15x  FAQs 2# alternating  15/15 Seated march 2# 15/15 Standing hamstring curls 2#  Standing heel raises 15x Alt. shoulder flexion/hip extension on wall 10/10 Seated hip tosses w/ball 10/10 Nustep L1 6 min  [facility] Adult PT Treatment:                                                DATE: 05/18/23 HR 65 O2 sats 97% Therapeutic Exercise: STS 10x w/UE support Seated hip tosses 2000g ball 10/10 Seated shoulder tosses with unweighted ball 10/10 Seated chops unweighted ball 10/10 LAQs with adduction 15x Seated ball roll flexion 3s hold 5x (increased lumbosacral symptoms) Seated ball press for core activation 3s hold 10x2    Therapeutic Activity: 2 MWT 135ft w/cane       PATIENT EDUCATION:  Education details: Discussed eval findings, rehab rationale and POC and patient is in agreement  Person educated: Patient Education method: Explanation Education comprehension: verbalized understanding and needs further education   HOME EXERCISE PROGRAM: Access Code: [email or web address] URL: [email or web address] Date: 04/06/2023 Prepared by: Gustavus Bryant   Exercises - Sit to Stand with Armchair  - 2 x daily - 5 x weekly - 1 sets - 5 reps - Standing Heel Raise with Support  - 2 x daily - 5 x weekly - 1-2 sets - 10 reps   ASSESSMENT:   CLINICAL IMPRESSION: Patient presents to PT reporting continued Rt sided lower back pain that is limiting her mobility. She reports that OT is going very well. Session today continued to focus on LE strengthening and aerobic tasks as she tolerance. Patient continues to benefit from skilled PT services and should be progressed as able to improve functional independence.   OBJECTIVE IMPAIRMENTS: Abnormal gait, decreased activity tolerance, decreased balance, decreased endurance, decreased mobility, difficulty walking, decreased strength, and postural dysfunction.    ACTIVITY LIMITATIONS: carrying, lifting, squatting, stairs, and locomotion level   PARTICIPATION LIMITATIONS: meal prep,  cleaning, laundry, shopping, community activity, and yard work   PERSONAL FACTORS: Fitness, Past/current experiences, Time since onset of injury/illness/exacerbation, and 1 comorbidity: hepatic cirrhosis   are also affecting patient's functional outcome.    REHAB POTENTIAL: Good   CLINICAL DECISION MAKING: Stable/uncomplicated   EVALUATION COMPLEXITY: Low     GOALS: Goals reviewed with patient? No   SHORT TERM GOALS: Target date: 05/27/2023   Patient to demonstrate independence in HEP  Baseline: [id number] Goal status: MET  2.  278ft ambulation with SPC and S Baseline: 37ft with SPC and SBA; 73ft with RW and SBA Goal status: Ongoing       LONG TERM GOALS: Target date: 07/18/2023     Increase BLE strength to 4/5 Baseline:  MMT Right eval Left eval R 04/28/23 L 6//24  Hip flexion 4- 4- 3+ 3+  Hip extension 4- 4- 3+ 3+  Hip abduction 4- 4- 3+ 3+  Hip adduction        Hip internal rotation        Hip external rotation        Knee flexion 4- 4- 3+ 3+  Knee extension 4- 4- 3+ 3+  Ankle dorsiflexion        Ankle plantarflexion 4- 4- 3 3    Goal status: INITIAL   2.  Increase BERG score to 50 Baseline: 44 Goal status: INITIAL   3.  Increase 30s chair stand test to 10 reps w/UE support Baseline: 7 stands with UE support; 04/28/23 4 stands with UE support Goal status: INITIAL   4.  Patient to negotiate 16 steps with single rail, cane and appropriate step pattern Baseline: TBD Goal status: INITIAL  5.  Increase FOTO score to 68% Baseline: 45% Goal status: INITIAL         PLAN:   PT FREQUENCY: 1-2x/week   PT DURATION: 8 weeks   PLANNED INTERVENTIONS: Therapeutic exercises, Therapeutic activity, Neuromuscular re-education, Balance training, Gait training, Patient/Family education, Self Care, Joint mobilization, Stair training, DME instructions, Manual therapy, and Re-evaluation   PLAN FOR NEXT SESSION: HEP review and update, manual techniques as appropriate,  aerobic tasks, ROM and flexibility activities, strengthening and PREs, TPDN, gait and balance training as needed       Berta Minor, PTA 06/01/2023, 12:57 PM

## 2023-06-01 NOTE — Therapy (Signed)
OUTPATIENT PHYSICAL THERAPY TREATMENT NOTE   Patient Name: [name] MRN: [id number] DOB:1951-09-25, 72 y.o., female Today's Date: 06/04/2023  PCP: Eden Emms, NP  REFERRING PROVIDER: Eden Emms, NP   END OF SESSION:   PT End of Session - 06/04/23 1259     Visit Number 12    Number of Visits 22    Date for PT Re-Evaluation 06/28/23    Authorization Type UHC MCR    PT Start Time 1300    PT Stop Time 1340    PT Time Calculation (min) 40 min    Activity Tolerance Patient limited by pain;Treatment limited secondary to medical complications (Comment)    Behavior During Therapy [facility] for tasks assessed/performed               Past Medical History:  Diagnosis Date   Acute urinary retention 05/04/2022   Allergy 2006 ?   Contrast dye   Arthritis 2016 ??   Knees and thumb   Cancer (HCC)    cecum   Cataract 2021   Surgery scheduled July 2023   Colon cancer [facility]) 2003   Elevated liver function tests    Esophageal varices (HCC)    Heart murmur On file   Hemorrhage of gastrointestinal tract 05/04/2011   Hypertension 2021   Hypothyroidism    Iron deficiency anemia    Liver disease    chemotherapy complication, per pt, shunts placed to bypass liver   Malignant neoplasm of cecum (HCC)    Portal hypertension (HCC)    Skin cancer 2019   Splenomegaly    Past Surgical History:  Procedure Laterality Date   COLON SURGERY  2004   Cancer   COSMETIC SURGERY  2021   Skin cancer   ESOPHAGEAL VARICE LIGATION     EYE SURGERY     HEMICOLECTOMY  01/08/2003   IR RADIOLOGIST EVAL & MGMT  12/20/2020   IR RADIOLOGIST EVAL & MGMT  05/29/2021   LIVER SURGERY     shunts placed after chemo complication   SKIN FULL THICKNESS GRAFT N/A 09/12/2019   Procedure: debridement and FTSG to the nose from left upper arm;  Surgeon: Allena Napoleon, MD;  Location: [facility];  Service: Plastics;  Laterality: N/A;  2 hours, please   TIPS PROCEDURE     Patient Active  Problem List   Diagnosis Date Noted   Bradycardia 05/07/2023   Sepsis due to group B Streptococcus with acute renal failure and septic shock (HCC) 04/24/2023   Septic shock (HCC) 04/21/2023   Adrenal insufficiency (HCC) 03/19/2023   Protein-calorie malnutrition, severe (HCC) 02/26/2023   Acute hepatic encephalopathy (HCC) 02/25/2023   Weight loss 02/24/2023   Bowel habit changes 02/24/2023   Generalized abdominal pain 02/24/2023   Hospital discharge follow-up 02/24/2023   Weakness 01/04/2023   Frequent UTI 05/18/2022   Anemia 05/18/2022   AKI (acute kidney injury) (HCC) 04/30/2022   Tachycardia-bradycardia syndrome (HCC) 02/05/2022   Hepatic encephalopathy (HCC) 02/03/2022   Basal cell carcinoma (BCC) 05/06/2021   Cecal cancer (HCC) 05/06/2021   Hypertension 05/06/2021   COVID-19 vaccination declined 01/30/2021   Decompensated liver cirrhosis with portal HTN and gastric varices 12/04/2020   Essential hypertension 08/12/2020   Murmur, cardiac 03/19/2020   Hypothyroidism 06/30/2019   History of basal cell cancer 06/30/2019   Hx of colon cancer, stage III 11/30/2011   Esophageal varices (HCC) 06/12/2010   Portal hypertension (HCC) 01/23/2008    REFERRING DIAG: R53.1 (ICD-10-CM) -  Weakness   THERAPY DIAG:  Muscle weakness (generalized)  Physical deconditioning  Unsteadiness on feet  Rationale for Evaluation and Treatment Rehabilitation  PERTINENT HISTORY:  See PMH  PRECAUTIONS: Fall  SUBJECTIVE:                                                                                                                                                                                      SUBJECTIVE STATEMENT: Still reporting R sided low back pain worse with prolonged sitting  PAIN:  Are you having pain? Yes (chronic LBP)   OBJECTIVE: (objective measures completed at initial evaluation unless otherwise dated)   DIAGNOSTIC FINDINGS: none   PATIENT SURVEYS:  FOTO 61(68  predicted); 04/28/23 45% 05/20/23 49%   MUSCLE LENGTH: Not tested   POSTURE: rounded shoulders, forward head, and increased thoracic kyphosis   PALPATION: Deferred based on Dx   LOWER EXTREMITY ROM: WFL for transfers and gait   Active ROM Right eval Left eval  Hip flexion      Hip extension      Hip abduction      Hip adduction      Hip internal rotation      Hip external rotation      Knee flexion      Knee extension      Ankle dorsiflexion      Ankle plantarflexion      Ankle inversion      Ankle eversion       (Blank rows = not tested)   LOWER EXTREMITY MMT:   MMT Right eval Left eval R 04/28/23 L 6//24  Hip flexion 4- 4- 3+ 3+  Hip extension 4- 4- 3+ 3+  Hip abduction 4- 4- 3+ 3+  Hip adduction        Hip internal rotation        Hip external rotation        Knee flexion 4- 4- 3+ 3+  Knee extension 4- 4- 3+ 3+  Ankle dorsiflexion        Ankle plantarflexion 4- 4- 3 3  Ankle inversion        Ankle eversion         (Blank rows = not tested)   LOWER EXTREMITY SPECIAL TESTS:  Deferred    FUNCTIONAL TESTS:  30 seconds chair stand test 7 stands with UE assist 04/28/23 4 stands with UE support 05/20/23 7 stands with UE Support   GAIT: Distance walked: 29ftx2 Assistive device utilized: Single point cane; 04/28/23 RW Level of assistance: SBA Comments: slow cadence,    Vital signs:  O2 sats 96% HR 58   TODAY'S TREATMENT:  [facility] Adult PT Treatment:                                                DATE: 06/04/23 Therapeutic Exercise: Seated latissimus press 3s 10x Seated lat press with FAQs 10/10 Seated lat press with marching 10/10 Standing abduction with foam roll support 10/10 CGA Nustep L1 8 min 5x STS from airex STS 2x5   [facility] Adult PT Treatment:                                                DATE: 06/01/23 Therapeutic Exercise: FAQs 2# with adduction 15x  FAQs 2# alternating 15/15 Seated hip adduction x15 Seated march 2# 3x30" Standing hamstring  curls 2# x15 BIL Standing heel raises 15x Alt. shoulder flexion/hip extension on wall 10/10 Nustep L1 6 min STS 2x5   [facility] Adult PT Treatment:                                                DATE: 05/20/23 HR 70 O2 sats 95%  Therapeutic Exercise: FAQs 2# with adduction 15x  FAQs 2# alternating 15/15 Seated march 2# 15/15 Standing hamstring curls 2#  Standing heel raises 15x Alt. shoulder flexion/hip extension on wall 10/10 Seated hip tosses w/ball 10/10 Nustep L1 6 min  [facility] Adult PT Treatment:                                                DATE: 05/18/23 HR 65 O2 sats 97% Therapeutic Exercise: STS 10x w/UE support Seated hip tosses 2000g ball 10/10 Seated shoulder tosses with unweighted ball 10/10 Seated chops unweighted ball 10/10 LAQs with adduction 15x Seated ball roll flexion 3s hold 5x (increased lumbosacral symptoms) Seated ball press for core activation 3s hold 10x2    Therapeutic Activity: 2 MWT 137ft w/cane       PATIENT EDUCATION:  Education details: Discussed eval findings, rehab rationale and POC and patient is in agreement  Person educated: Patient Education method: Explanation Education comprehension: verbalized understanding and needs further education   HOME EXERCISE PROGRAM: Access Code: [email or web address] URL: [email or web address] Date: 04/06/2023 Prepared by: Gustavus Bryant   Exercises - Sit to Stand with Armchair  - 2 x daily - 5 x weekly - 1 sets - 5 reps - Standing Heel Raise with Support  - 2 x daily - 5 x weekly - 1-2 sets - 10 reps   ASSESSMENT:   CLINICAL IMPRESSION: Continued c/o R low back and now gluteal and lateral thigh discomfort.  Recommended potential referral to Sports Medicine through [facility] healthcare.  Increased time on aerobic work to build endurance.  Focus on eccentric tasks, abduction and core tasks.  Apprehensive regarding abduction without firm UE support.   OBJECTIVE IMPAIRMENTS: Abnormal gait, decreased activity  tolerance, decreased balance, decreased endurance, decreased mobility, difficulty walking, decreased strength, and postural dysfunction.    ACTIVITY LIMITATIONS: carrying, lifting, squatting, stairs, and locomotion level   PARTICIPATION LIMITATIONS: meal  prep, cleaning, laundry, shopping, community activity, and yard work   PERSONAL FACTORS: Fitness, Past/current experiences, Time since onset of injury/illness/exacerbation, and 1 comorbidity: hepatic cirrhosis   are also affecting patient's functional outcome.    REHAB POTENTIAL: Good   CLINICAL DECISION MAKING: Stable/uncomplicated   EVALUATION COMPLEXITY: Low     GOALS: Goals reviewed with patient? No   SHORT TERM GOALS: Target date: 05/27/2023   Patient to demonstrate independence in HEP  Baseline: [id number] Goal status: MET   2.  218ft ambulation with SPC and S Baseline: 18ft with SPC and SBA; 50ft with RW and SBA Goal status: Ongoing       LONG TERM GOALS: Target date: 07/18/2023     Increase BLE strength to 4/5 Baseline:  MMT Right eval Left eval R 04/28/23 L 6//24  Hip flexion 4- 4- 3+ 3+  Hip extension 4- 4- 3+ 3+  Hip abduction 4- 4- 3+ 3+  Hip adduction        Hip internal rotation        Hip external rotation        Knee flexion 4- 4- 3+ 3+  Knee extension 4- 4- 3+ 3+  Ankle dorsiflexion        Ankle plantarflexion 4- 4- 3 3    Goal status: INITIAL   2.  Increase BERG score to 50 Baseline: 44 Goal status: INITIAL   3.  Increase 30s chair stand test to 10 reps w/UE support Baseline: 7 stands with UE support; 04/28/23 4 stands with UE support Goal status: INITIAL   4.  Patient to negotiate 16 steps with single rail, cane and appropriate step pattern Baseline: TBD Goal status: INITIAL  5.  Increase FOTO score to 68% Baseline: 45% Goal status: INITIAL         PLAN:   PT FREQUENCY: 1-2x/week   PT DURATION: 8 weeks   PLANNED INTERVENTIONS: Therapeutic exercises, Therapeutic activity,  Neuromuscular re-education, Balance training, Gait training, Patient/Family education, Self Care, Joint mobilization, Stair training, DME instructions, Manual therapy, and Re-evaluation   PLAN FOR NEXT SESSION: HEP review and update, manual techniques as appropriate, aerobic tasks, ROM and flexibility activities, strengthening and PREs, TPDN, gait and balance training as needed       Hildred Laser, PT 06/04/2023, 1:01 PM

## 2023-06-02 ENCOUNTER — Ambulatory Visit: Payer: Medicare Other | Admitting: Occupational Therapy

## 2023-06-02 DIAGNOSIS — M25632 Stiffness of left wrist, not elsewhere classified: Secondary | ICD-10-CM | POA: Diagnosis not present

## 2023-06-02 DIAGNOSIS — R2681 Unsteadiness on feet: Secondary | ICD-10-CM | POA: Diagnosis not present

## 2023-06-02 DIAGNOSIS — R278 Other lack of coordination: Secondary | ICD-10-CM

## 2023-06-02 DIAGNOSIS — R5381 Other malaise: Secondary | ICD-10-CM | POA: Diagnosis not present

## 2023-06-02 DIAGNOSIS — M6281 Muscle weakness (generalized): Secondary | ICD-10-CM

## 2023-06-02 DIAGNOSIS — R29898 Other symptoms and signs involving the musculoskeletal system: Secondary | ICD-10-CM

## 2023-06-02 NOTE — Therapy (Signed)
OUTPATIENT OCCUPATIONAL THERAPY ORTHO TREATMENT  Patient Name: [name] MRN: [id number] DOB:[birth year], 72 y.o., female Today's Date: 06/02/2023  PCP: Eden Emms, NP  REFERRING PROVIDER: Marlyne Beards, MD  END OF SESSION:  OT End of Session - 06/02/23 1444     Visit Number 7    Number of Visits 13    Date for OT Re-Evaluation 06/25/23    Authorization Type UHC Medicare    Progress Note Due on Visit 10    OT Start Time 1448    Activity Tolerance Patient tolerated treatment well;No increased pain    Behavior During Therapy [facility] for tasks assessed/performed             Past Medical History:  Diagnosis Date   Acute urinary retention 05/04/2022   Allergy 2006 ?   Contrast dye   Arthritis 2016 ??   Knees and thumb   Cancer (HCC)    cecum   Cataract 2021   Surgery scheduled July 2023   Colon cancer [facility]) 2003   Elevated liver function tests    Esophageal varices (HCC)    Heart murmur On file   Hemorrhage of gastrointestinal tract 05/04/2011   Hypertension 2021   Hypothyroidism    Iron deficiency anemia    Liver disease    chemotherapy complication, per pt, shunts placed to bypass liver   Malignant neoplasm of cecum (HCC)    Portal hypertension (HCC)    Skin cancer 2019   Splenomegaly    Past Surgical History:  Procedure Laterality Date   COLON SURGERY  2004   Cancer   COSMETIC SURGERY  2021   Skin cancer   ESOPHAGEAL VARICE LIGATION     EYE SURGERY     HEMICOLECTOMY  01/08/2003   IR RADIOLOGIST EVAL & MGMT  12/20/2020   IR RADIOLOGIST EVAL & MGMT  05/29/2021   LIVER SURGERY     shunts placed after chemo complication   SKIN FULL THICKNESS GRAFT N/A 09/12/2019   Procedure: debridement and FTSG to the nose from left upper arm;  Surgeon: Allena Napoleon, MD;  Location: [facility];  Service: Plastics;  Laterality: N/A;  2 hours, please   TIPS PROCEDURE     Patient Active Problem List   Diagnosis Date Noted   Bradycardia  05/07/2023   Sepsis due to group B Streptococcus with acute renal failure and septic shock (HCC) 04/24/2023   Septic shock (HCC) 04/21/2023   Adrenal insufficiency (HCC) 03/19/2023   Protein-calorie malnutrition, severe (HCC) 02/26/2023   Acute hepatic encephalopathy (HCC) 02/25/2023   Weight loss 02/24/2023   Bowel habit changes 02/24/2023   Generalized abdominal pain 02/24/2023   Hospital discharge follow-up 02/24/2023   Weakness 01/04/2023   Frequent UTI 05/18/2022   Anemia 05/18/2022   AKI (acute kidney injury) (HCC) 04/30/2022   Tachycardia-bradycardia syndrome (HCC) 02/05/2022   Hepatic encephalopathy (HCC) 02/03/2022   Basal cell carcinoma (BCC) 05/06/2021   Cecal cancer (HCC) 05/06/2021   Hypertension 05/06/2021   COVID-19 vaccination declined 01/30/2021   Decompensated liver cirrhosis with portal HTN and gastric varices 12/04/2020   Essential hypertension 08/12/2020   Murmur, cardiac 03/19/2020   Hypothyroidism 06/30/2019   History of basal cell cancer 06/30/2019   Hx of colon cancer, stage III 11/30/2011   Esophageal varices (HCC) 06/12/2010   Portal hypertension (HCC) 01/23/2008    ONSET DATE: 01/29/2023  REFERRING DIAG: fracture of distal end of radius left  THERAPY DIAG:  Muscle weakness (generalized)  Other  lack of coordination  Stiffness of left wrist, not elsewhere classified  Other symptoms and signs involving the musculoskeletal system  Rationale for Evaluation and Treatment: Rehabilitation  SUBJECTIVE:  SUBJECTIVE STATEMENT: Pt's husband reports her fist continues to improve. Some R shoulder discomfort reported with use of clips.   Return to orthopedist 06/28/23  Pt accompanied by: significant other - [name]  PERTINENT HISTORY:  Closed, L distal radius fracture, managing conservatively due to health issues. She is not a candidate for surgery.  Follows up with ortho 06/28/2023. Referred to OPOT for eval and treat.    PRECAUTIONS: Other: to tolerance   - wear brace with strenuous activity  WEIGHT BEARING RESTRICTIONS:  to tolerance LUE  PAIN:  Are you having pain? No  FALLS: Has patient fallen in last 6 months? Yes. 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 navy officer and son-in Social worker is Runner, broadcasting/film/video); was driving   PATIENT GOALS: return to [employer]   OBJECTIVE:   HAND DOMINANCE: Right  ADLs: Overall ADLs: mod I  MOBILITY STATUS: Needs Assist: use of cane  ACTIVITY TOLERANCE: Activity tolerance: fair  FUNCTIONAL OUTCOME MEASURES: Quick Dash: 29.5% disability  - See image/specific items on eval 05/12/23  UPPER EXTREMITY ROM:     AROM Right (eval) Left (eval) Left 7/3  Shoulder flexion WNL    Shoulder abduction WNL    Elbow flexion WNL    Elbow extension WNL    Wrist flexion WNL 43* 50*  Wrist extension WNL 39* 38*  Wrist pronation WNL    Wrist supination WNL     Digit Composite Flexion WNL Lacks 4 cm index; 5 cm middle; 4 cm ring; 3.5 cm pinky Lacks 2 cm index; 3 cm middle;  ring and pinky - full  Digit Composite Extension WNL    Digit Opposition WNL WFL WNL  (Blank rows = not tested)  RUE IR limited to just behind hip  UPPER EXTREMITY MMT:     BUE: WFL shoulders and elbows  HAND FUNCTION: Grip strength: Right: 24.4 lbs; Left: 7.2 lbs  COORDINATION: 9 Hole Peg test: Right: 28 sec; Left: 38 sec  SENSATION: WFL  EDEMA: none reported or observed  COGNITION: Overall cognitive status: History of cognitive impairments - at baseline  OBSERVATIONS: Pt appears well-kept. Ambulates with cane. No LOB though slow, altered  gait.   TODAY'S TREATMENT:                                                                                                                               -Therapeutic Exercises  Red putty for search, IP  fisting, and flexion (at table in pronation) for improved strength and coordination as noted in pt instructions .  Patient completed left grasp of ball for pick up of 8 marbles from table for improved grip strength.   Pt completed tennis ball Cs" to improve L thumb abduction and conical grasp assumption.   Conical grasp of large and medium cones with L hand and stacking of cones 4 sets of 9 using R  hand to stabilize for coordination and strength of affected extremity.  PATIENT EDUCATION: Education details: LUE ROM light strengthening Person educated: Patient and Spouse Education method: Explanation, Demonstration, Tactile cues, and Verbal cues Education comprehension: verbalized understanding, returned demonstration, verbal cues required, tactile cues required, and needs further education  HOME EXERCISE PROGRAM: 05/17/23 - Wrist ROM Access Code: [facility]  05/19/23 - Coordination Activities Handout with images  05/24/23 - Intro Putty Exercises Access Code: [email or web address]  06/02/2023 - red putty (updated)   GOALS:  SHORT TERM GOALS: Target date: 06/09/2023    Pt will demonstrate full composite flexion of L digits.  Baseline: Lacks 4 cm index; 5 cm middle; 4 cm ring; 3.5 cm Goal status: IN PROGRESS  2.  Pt will demonstrate L internal rotation to middle of spine to aid with ADL completion. Baseline: just behind hip Goal status: IN PROGRESS   LONG TERM GOALS: Target date: 06/25/2023    Patient will demonstrate LUE HEP with 25% verbal cues or less for proper execution. Baseline:  Goal status: IN PROGRESS  2.  Patient will demonstrate at least 16% improvement with quick Dash score (reporting 13.5% disability or less) indicating improved functional use of affected extremity (opening  containers, washing back, carrying objects/bags). Baseline: 29.5% disability with use of LUE Goal status: IN PROGRESS  3.  Pt will demonstrate at least 120* combined L wrist ext/flex AROM.  Baseline: L wrist ext - 39*; flex - 43* Goal status: IN PROGRESS  4.  Patient will demonstrate at least 17.2 lbs L grip strength as needed to open jars and other containers. Baseline: 7.2 lbs 06/02/2023: 19.8lbs Goal status: MET  5.  Patient will complete nine-hole peg with use of L in 30 seconds or less. Baseline: 38 seconds Goal status: IN PROGRESS   ASSESSMENT:  CLINICAL IMPRESSION: Pt demonstrates fair tolerance of functional strengthening today which improved with additional cueing and instruction. As skin on L forearm is not fully healed, deferring fluido this session. Will continue to monitor appropriate thermal modality.   PERFORMANCE DEFICITS: in functional skills including ADLs, IADLs, coordination, ROM, strength, pain, Fine motor control, and UE functional use, cognitive skills including memory and thought  IMPAIRMENTS: are limiting patient from ADLs, IADLs, and work.   CO-MORBIDITIES: may have co-morbidities  that affects occupational performance. Patient will benefit from skilled OT to address above impairments and improve overall function.  REHAB POTENTIAL: Good   PLAN:  OT FREQUENCY: 2x/week  OT DURATION: 6 weeks  PLANNED INTERVENTIONS: self care/ADL training, therapeutic exercise, therapeutic activity, manual therapy, passive range of motion, splinting, ultrasound, paraffin, fluidotherapy, moist heat, contrast bath, patient/family education, DME and/or AE instructions, and Re-evaluation  RECOMMENDED OTHER SERVICES: none at this time  CONSULTED AND AGREED WITH PLAN OF CARE: Patient and family member/caregiver  PLAN FOR NEXT SESSION: [place]  Review putty HEP  Modalities (fluidotherapy ONLY if skin on L forearm intact/US) for progressing P/AROM and stretching wrist  .   Delana Meyer, OT 06/02/2023, 2:48 PM

## 2023-06-02 NOTE — Patient Instructions (Signed)
   IP Fisting (Resistive Putty)    Keeping knuckles straight, bend fingertips to squeeze putty. Repeat ____ times. Do ____ sessions per day.  Copyright  VHI. All rights reserved.  FINGERS: Flexion (Putty)    Place hand over flat putty. Gather putty into a ball. ___ reps per set, ___ sets per day, ___ days per week   Copyright  VHI. All rights reserved.

## 2023-06-03 ENCOUNTER — Encounter: Payer: Self-pay | Admitting: "Endocrinology

## 2023-06-03 ENCOUNTER — Other Ambulatory Visit (INDEPENDENT_AMBULATORY_CARE_PROVIDER_SITE_OTHER): Payer: Medicare Other

## 2023-06-03 DIAGNOSIS — E2740 Unspecified adrenocortical insufficiency: Secondary | ICD-10-CM | POA: Diagnosis not present

## 2023-06-03 LAB — CORTISOL: Cortisol, Plasma: 10.7 ug/dL

## 2023-06-04 ENCOUNTER — Ambulatory Visit: Payer: Medicare Other

## 2023-06-04 DIAGNOSIS — R2681 Unsteadiness on feet: Secondary | ICD-10-CM

## 2023-06-04 DIAGNOSIS — M6281 Muscle weakness (generalized): Secondary | ICD-10-CM

## 2023-06-04 DIAGNOSIS — R5381 Other malaise: Secondary | ICD-10-CM

## 2023-06-04 LAB — DHEA-SULFATE: DHEA-SO4: 11 ug/dL (ref 4–157)

## 2023-06-07 ENCOUNTER — Other Ambulatory Visit: Payer: Self-pay | Admitting: "Endocrinology

## 2023-06-07 ENCOUNTER — Ambulatory Visit: Payer: Medicare Other | Admitting: Occupational Therapy

## 2023-06-07 DIAGNOSIS — M6281 Muscle weakness (generalized): Secondary | ICD-10-CM | POA: Diagnosis not present

## 2023-06-07 DIAGNOSIS — R29898 Other symptoms and signs involving the musculoskeletal system: Secondary | ICD-10-CM | POA: Diagnosis not present

## 2023-06-07 DIAGNOSIS — E2740 Unspecified adrenocortical insufficiency: Secondary | ICD-10-CM

## 2023-06-07 DIAGNOSIS — M25632 Stiffness of left wrist, not elsewhere classified: Secondary | ICD-10-CM | POA: Diagnosis not present

## 2023-06-07 DIAGNOSIS — R278 Other lack of coordination: Secondary | ICD-10-CM | POA: Diagnosis not present

## 2023-06-07 DIAGNOSIS — R5381 Other malaise: Secondary | ICD-10-CM | POA: Diagnosis not present

## 2023-06-07 DIAGNOSIS — R2681 Unsteadiness on feet: Secondary | ICD-10-CM | POA: Diagnosis not present

## 2023-06-07 NOTE — Therapy (Signed)
OUTPATIENT OCCUPATIONAL THERAPY ORTHO TREATMENT  Patient Name: [name] MRN: [id number] DOB:[birth year], 72 y.o., female Today's Date: 06/07/2023  PCP: Eden Emms, NP  REFERRING PROVIDER: Marlyne Beards, MD  END OF SESSION:  OT End of Session - 06/07/23 1451     Visit Number 8    Number of Visits 13    Date for OT Re-Evaluation 06/25/23    Authorization Type U[employer] Medicare    Progress Note Due on Visit 10    OT Start Time 1450    OT Stop Time 1529    OT Time Calculation (min) 39 min    Activity Tolerance Patient tolerated treatment well;No increased pain    Behavior During Therapy [facility] for tasks assessed/performed             Past Medical History:  Diagnosis Date   Acute urinary retention 05/04/2022   Allergy 2006 ?   Contrast dye   Arthritis 2016 ??   Knees and thumb   Cancer (HCC)    cecum   Cataract 2021   Surgery scheduled July 2023   Colon cancer [facility]) 2003   Elevated liver function tests    Esophageal varices (HCC)    Heart murmur On file   Hemorrhage of gastrointestinal tract 05/04/2011   Hypertension 2021   Hypothyroidism    Iron deficiency anemia    Liver disease    chemotherapy complication, per pt, shunts placed to bypass liver   Malignant neoplasm of cecum (HCC)    Portal hypertension (HCC)    Skin cancer 2019   Splenomegaly    Past Surgical History:  Procedure Laterality Date   COLON SURGERY  2004   Cancer   COSMETIC SURGERY  2021   Skin cancer   ESOPHAGEAL VARICE LIGATION     EYE SURGERY     HEMICOLECTOMY  01/08/2003   IR RADIOLOGIST EVAL & MGMT  12/20/2020   IR RADIOLOGIST EVAL & MGMT  05/29/2021   LIVER SURGERY     shunts placed after chemo complication   SKIN FULL THICKNESS GRAFT N/A 09/12/2019   Procedure: debridement and FTSG to the nose from left upper arm;  Surgeon: Allena Napoleon, MD;  Location: [facility];  Service: Plastics;  Laterality: N/A;  2 hours, please   TIPS PROCEDURE     Patient Active  Problem List   Diagnosis Date Noted   Bradycardia 05/07/2023   Sepsis due to group B Streptococcus with acute renal failure and septic shock (HCC) 04/24/2023   Septic shock (HCC) 04/21/2023   Adrenal insufficiency (HCC) 03/19/2023   Protein-calorie malnutrition, severe (HCC) 02/26/2023   Acute hepatic encephalopathy (HCC) 02/25/2023   Weight loss 02/24/2023   Bowel habit changes 02/24/2023   Generalized abdominal pain 02/24/2023   Hospital discharge follow-up 02/24/2023   Weakness 01/04/2023   Frequent UTI 05/18/2022   Anemia 05/18/2022   AKI (acute kidney injury) (HCC) 04/30/2022   Tachycardia-bradycardia syndrome (HCC) 02/05/2022   Hepatic encephalopathy (HCC) 02/03/2022   Basal cell carcinoma (BCC) 05/06/2021   Cecal cancer (HCC) 05/06/2021   Hypertension 05/06/2021   COVID-19 vaccination declined 01/30/2021   Decompensated liver cirrhosis with portal HTN and gastric varices 12/04/2020   Essential hypertension 08/12/2020   Murmur, cardiac 03/19/2020   Hypothyroidism 06/30/2019   History of basal cell cancer 06/30/2019   Hx of colon cancer, stage III 11/30/2011   Esophageal varices (HCC) 06/12/2010   Portal hypertension (HCC) 01/23/2008    ONSET DATE: 01/29/2023  REFERRING DIAG:  fracture of distal end of radius left  THERAPY DIAG:  Muscle weakness (generalized)  Physical deconditioning  Other lack of coordination  Stiffness of left wrist, not elsewhere classified  Other symptoms and signs involving the musculoskeletal system  Rationale for Evaluation and Treatment: Rehabilitation  SUBJECTIVE:  SUBJECTIVE STATEMENT: She reports she doesn't use it as much especially as it is her non-dominant hand.   Return to orthopedist 06/28/23  Pt accompanied by: significant other - [name]  PERTINENT HISTORY:  Closed, L distal radius fracture, managing conservatively due to health issues. She is not a candidate for surgery.  Follows up with ortho 06/28/2023. Referred to OPOT for  eval and treat.    PRECAUTIONS: Other: to tolerance  - wear brace with strenuous activity  WEIGHT BEARING RESTRICTIONS:  to tolerance LUE  PAIN:  Are you having pain? No  FALLS: Has patient fallen in last 6 months? Yes. 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 navy officer and son-in Social worker is Runner, broadcasting/film/video); was driving   PATIENT GOALS: return to [employer]   OBJECTIVE:   HAND DOMINANCE: Right  ADLs: Overall ADLs: mod I  MOBILITY STATUS: Needs Assist: use of cane  ACTIVITY TOLERANCE: Activity tolerance: fair  FUNCTIONAL OUTCOME MEASURES: Quick Dash: 29.5% disability  - See image/specific items on eval 05/12/23  UPPER EXTREMITY ROM:     AROM Right (eval) Left (eval) Left 7/3  Shoulder flexion WNL    Shoulder abduction WNL    Elbow flexion WNL    Elbow extension WNL    Wrist flexion WNL 43* 50*  Wrist extension WNL 39* 38*  Wrist pronation WNL    Wrist supination WNL     Digit Composite Flexion WNL Lacks 4 cm index; 5 cm middle; 4 cm ring; 3.5 cm pinky Lacks 2 cm index; 3 cm middle;  ring and pinky - full  Digit Composite Extension WNL    Digit Opposition WNL WFL WNL  (Blank rows = not tested)  RUE IR limited to just behind hip  UPPER EXTREMITY MMT:     BUE: WFL shoulders and elbows  HAND FUNCTION: Grip strength: Right: 24.4 lbs; Left: 7.2 lbs  COORDINATION: 9 Hole Peg test: Right: 28 sec; Left: 38 sec  SENSATION: WFL  EDEMA: none reported or observed  COGNITION: Overall cognitive status: History of cognitive impairments - at baseline  OBSERVATIONS: Pt appears well-kept.  Ambulates with cane. No LOB though slow, altered gait.   TODAY'S TREATMENT:                                                                                                                               -  Therapeutic Exercises  OT reviewed putty HEP from last session as noted in pt instructions. Pt required min cueing for proper execution.   - Therapeutic activities completed for duration as noted below including:  With use of L, pt placed and then removed colored, medium pegs into corresponding hole with use of pattern sheets for ROM, coordination, and strength of affected extremity. Pt cued to extend L wrist as much as possible.  PATIENT EDUCATION: Education details: LUE ROM light strengthening Person educated: Patient and Spouse Education method: Explanation, Demonstration, Tactile cues, and Verbal cues Education comprehension: verbalized understanding, returned demonstration, verbal cues required, tactile cues required, and needs further education  HOME EXERCISE PROGRAM: 05/17/23 - Wrist ROM Access Code: [facility]  05/19/23 - Coordination Activities Handout with images  05/24/23 - Intro Putty Exercises Access Code: [email or web address]  06/02/2023 - red putty (updated)   GOALS:  SHORT TERM GOALS: Target date: 06/09/2023    Pt will demonstrate full composite flexion of L digits.  Baseline: Lacks 4 cm index; 5 cm middle; 4 cm ring; 3.5 cm Goal status: IN PROGRESS  2.  Pt will demonstrate L internal rotation to middle of spine to aid with ADL completion. Baseline: just behind hip Goal status: IN PROGRESS   LONG TERM GOALS: Target date: 06/25/2023    Patient will demonstrate LUE HEP with 25% verbal cues or less for proper execution. Baseline:  Goal status: IN PROGRESS  2.  Patient will demonstrate at least 16% improvement with quick Dash score (reporting 13.5% disability or less) indicating improved functional use of affected extremity (opening containers, washing back, carrying  objects/bags). Baseline: 29.5% disability with use of LUE Goal status: IN PROGRESS  3.  Pt will demonstrate at least 120* combined L wrist ext/flex AROM.  Baseline: L wrist ext - 39*; flex - 43* Goal status: IN PROGRESS  4.  Patient will demonstrate at least 17.2 lbs L grip strength as needed to open jars and other containers. Baseline: 7.2 lbs 06/02/2023: 19.8lbs Goal status: MET  5.  Patient will complete nine-hole peg with use of L in 30 seconds or less. Baseline: 38 seconds Goal status: IN PROGRESS   ASSESSMENT:  CLINICAL IMPRESSION: Pt demonstrates fair understanding of HEP though requires cues to extend wrist with completion. Feel pt would progress towards goals at a faster rate if she would use L hand more consistently with functional tasks as pt has been using R, non-dominant hand.   PERFORMANCE DEFICITS: in functional skills including ADLs, IADLs, coordination, ROM, strength, pain, Fine motor control, and UE functional use, cognitive skills including memory and thought  IMPAIRMENTS: are limiting patient from ADLs, IADLs, and work.   CO-MORBIDITIES: may have co-morbidities  that affects occupational performance. Patient will benefit from skilled OT to address above impairments and improve overall function.  REHAB POTENTIAL: Good   PLAN:  OT FREQUENCY: 2x/week  OT DURATION: 6 weeks  PLANNED INTERVENTIONS: self care/ADL training, therapeutic exercise, therapeutic activity, manual therapy, passive range of motion, splinting, ultrasound, paraffin, fluidotherapy, moist heat, contrast bath, patient/family education, DME and/or AE instructions, and Re-evaluation  RECOMMENDED OTHER SERVICES: none at this time  CONSULTED AND AGREED WITH PLAN OF CARE: Patient and family member/caregiver  PLAN FOR NEXT SESSION:  Functional use of LUE  Paraffin ?   Modalities (fluidotherapy ONLY if skin on L forearm intact/US) for progressing P/AROM and stretching wrist .   Delana Meyer, OT 06/07/2023, 2:53 PM

## 2023-06-07 NOTE — Therapy (Unsigned)
OUTPATIENT PHYSICAL THERAPY TREATMENT NOTE   Patient Name: [name] MRN: [id number] DOB:[birth year], 72 y.o., female Today's Date: 06/08/2023  PCP: Eden Emms, NP  REFERRING PROVIDER: Eden Emms, NP   END OF SESSION:   PT End of Session - 06/08/23 1444     Visit Number 13    Date for PT Re-Evaluation 06/28/23    Authorization Type UHC MCR    PT Start Time 1445    PT Stop Time 1525    PT Time Calculation (min) 40 min    Activity Tolerance Patient limited by pain;Treatment limited secondary to medical complications (Comment)    Behavior During Therapy [facility] for tasks assessed/performed                Past Medical History:  Diagnosis Date   Acute urinary retention 05/04/2022   Allergy 2006 ?   Contrast dye   Arthritis 2016 ??   Knees and thumb   Cancer (HCC)    cecum   Cataract 2021   Surgery scheduled July 2023   Colon cancer [facility]) 2003   Elevated liver function tests    Esophageal varices (HCC)    Heart murmur On file   Hemorrhage of gastrointestinal tract 05/04/2011   Hypertension 2021   Hypothyroidism    Iron deficiency anemia    Liver disease    chemotherapy complication, per pt, shunts placed to bypass liver   Malignant neoplasm of cecum (HCC)    Portal hypertension (HCC)    Skin cancer 2019   Splenomegaly    Past Surgical History:  Procedure Laterality Date   COLON SURGERY  2004   Cancer   COSMETIC SURGERY  2021   Skin cancer   ESOPHAGEAL VARICE LIGATION     EYE SURGERY     HEMICOLECTOMY  01/08/2003   IR RADIOLOGIST EVAL & MGMT  12/20/2020   IR RADIOLOGIST EVAL & MGMT  05/29/2021   LIVER SURGERY     shunts placed after chemo complication   SKIN FULL THICKNESS GRAFT N/A 09/12/2019   Procedure: debridement and FTSG to the nose from left upper arm;  Surgeon: Allena Napoleon, MD;  Location: [facility];  Service: Plastics;  Laterality: N/A;  2 hours, please   TIPS PROCEDURE     Patient Active Problem List   Diagnosis  Date Noted   Bradycardia 05/07/2023   Sepsis due to group B Streptococcus with acute renal failure and septic shock (HCC) 04/24/2023   Septic shock (HCC) 04/21/2023   Adrenal insufficiency (HCC) 03/19/2023   Protein-calorie malnutrition, severe (HCC) 02/26/2023   Acute hepatic encephalopathy (HCC) 02/25/2023   Weight loss 02/24/2023   Bowel habit changes 02/24/2023   Generalized abdominal pain 02/24/2023   Hospital discharge follow-up 02/24/2023   Weakness 01/04/2023   Frequent UTI 05/18/2022   Anemia 05/18/2022   AKI (acute kidney injury) (HCC) 04/30/2022   Tachycardia-bradycardia syndrome (HCC) 02/05/2022   Hepatic encephalopathy (HCC) 02/03/2022   Basal cell carcinoma (BCC) 05/06/2021   Cecal cancer (HCC) 05/06/2021   Hypertension 05/06/2021   COVID-19 vaccination declined 01/30/2021   Decompensated liver cirrhosis with portal HTN and gastric varices 12/04/2020   Essential hypertension 08/12/2020   Murmur, cardiac 03/19/2020   Hypothyroidism 06/30/2019   History of basal cell cancer 06/30/2019   Hx of colon cancer, stage III 11/30/2011   Esophageal varices (HCC) 06/12/2010   Portal hypertension (HCC) 01/23/2008    REFERRING DIAG: R53.1 (ICD-10-CM) - Weakness   THERAPY DIAG:  Muscle weakness (generalized)  Physical deconditioning  Unsteadiness on feet  Rationale for Evaluation and Treatment Rehabilitation  PERTINENT HISTORY:  See PMH  PRECAUTIONS: Fall  SUBJECTIVE:                                                                                                                                                                                      SUBJECTIVE STATEMENT: No changes to report since last session  PAIN:  Are you having pain? Yes (chronic LBP)   OBJECTIVE: (objective measures completed at initial evaluation unless otherwise dated)   DIAGNOSTIC FINDINGS: none   PATIENT SURVEYS:  FOTO 61(68 predicted); 04/28/23 45% 05/20/23 49%   MUSCLE LENGTH: Not  tested   POSTURE: rounded shoulders, forward head, and increased thoracic kyphosis   PALPATION: Deferred based on Dx   LOWER EXTREMITY ROM: WFL for transfers and gait   Active ROM Right eval Left eval  Hip flexion      Hip extension      Hip abduction      Hip adduction      Hip internal rotation      Hip external rotation      Knee flexion      Knee extension      Ankle dorsiflexion      Ankle plantarflexion      Ankle inversion      Ankle eversion       (Blank rows = not tested)   LOWER EXTREMITY MMT:   MMT Right eval Left eval R 04/28/23 L 6//24  Hip flexion 4- 4- 3+ 3+  Hip extension 4- 4- 3+ 3+  Hip abduction 4- 4- 3+ 3+  Hip adduction        Hip internal rotation        Hip external rotation        Knee flexion 4- 4- 3+ 3+  Knee extension 4- 4- 3+ 3+  Ankle dorsiflexion        Ankle plantarflexion 4- 4- 3 3  Ankle inversion        Ankle eversion         (Blank rows = not tested)   LOWER EXTREMITY SPECIAL TESTS:  Deferred    FUNCTIONAL TESTS:  30 seconds chair stand test 7 stands with UE assist 04/28/23 4 stands with UE support 05/20/23 7 stands with UE Support   GAIT: Distance walked: 24ftx2 Assistive device utilized: Single point cane; 04/28/23 RW Level of assistance: SBA Comments: slow cadence,  05/18/23 2 MWT 176ft w/cane  06/08/23 2 MWT 219ft with cane   Vital signs:  O2 sats 96% HR 58   TODAY'S TREATMENT:  [facility] Adult PT Treatment:                                                DATE: 06/08/23 Therapeutic Exercise: Static stand on airex 30s hold wide/narrow BOS, EO,EC 4 bouts Stepping and weight shifting at counter 4 in step 10/10 no UE support  Side stepping at countertop 3 trips with fingertip support Tandem and retrowalking at counter 3 trips 5x STS from airex FAQs 3# with adduction 15x  FAQs 3# alternating 15/15  [facility] Adult PT Treatment:                                                DATE: 06/04/23 Therapeutic Exercise: Seated  latissimus press 3s 10x Seated lat press with FAQs 10/10 Seated lat press with marching 10/10 Standing abduction with foam roll support 10/10 CGA Nustep L1 8 min 5x STS from airex STS 2x5   [facility] Adult PT Treatment:                                                DATE: 06/01/23 Therapeutic Exercise: FAQs 2# with adduction 15x  FAQs 2# alternating 15/15 Seated hip adduction x15 Seated march 2# 3x30" Standing hamstring curls 2# x15 BIL Standing heel raises 15x Alt. shoulder flexion/hip extension on wall 10/10 Nustep L1 6 min STS 2x5   [facility] Adult PT Treatment:                                                DATE: 05/20/23 HR 70 O2 sats 95%  Therapeutic Exercise: FAQs 2# with adduction 15x  FAQs 2# alternating 15/15 Seated march 2# 15/15 Standing hamstring curls 2#  Standing heel raises 15x Alt. shoulder flexion/hip extension on wall 10/10 Seated hip tosses w/ball 10/10 Nustep L1 6 min  [facility] Adult PT Treatment:                                                DATE: 05/18/23 HR 65 O2 sats 97% Therapeutic Exercise: STS 10x w/UE support Seated hip tosses 2000g ball 10/10 Seated shoulder tosses with unweighted ball 10/10 Seated chops unweighted ball 10/10 LAQs with adduction 15x Seated ball roll flexion 3s hold 5x (increased lumbosacral symptoms) Seated ball press for core activation 3s hold 10x2    Therapeutic Activity: 2 MWT 181ft w/cane       PATIENT EDUCATION:  Education details: Discussed eval findings, rehab rationale and POC and patient is in agreement  Person educated: Patient Education method: Explanation Education comprehension: verbalized understanding and needs further education   HOME EXERCISE PROGRAM: Access Code: [email or web address] URL: [email or web address] Date: 04/06/2023 Prepared by: Gustavus Bryant   Exercises - Sit to Stand with Armchair  - 2 x daily - 5 x weekly - 1 sets - 5 reps - Standing Heel Raise with  Support  - 2 x daily - 5 x weekly -  1-2 sets - 10 reps   ASSESSMENT:   CLINICAL IMPRESSION: Todays session focused on standing balance tasks using EO/EC and compliant surface environments.  2 MWT distance increased.  R low back pain still limits ability to tolerate standing and supine tasks.  Now able to control eccentric aspect of sitting w/o UE support.   OBJECTIVE IMPAIRMENTS: Abnormal gait, decreased activity tolerance, decreased balance, decreased endurance, decreased mobility, difficulty walking, decreased strength, and postural dysfunction.    ACTIVITY LIMITATIONS: carrying, lifting, squatting, stairs, and locomotion level   PARTICIPATION LIMITATIONS: meal prep, cleaning, laundry, shopping, community activity, and yard work   PERSONAL FACTORS: Fitness, Past/current experiences, Time since onset of injury/illness/exacerbation, and 1 comorbidity: hepatic cirrhosis   are also affecting patient's functional outcome.    REHAB POTENTIAL: Good   CLINICAL DECISION MAKING: Stable/uncomplicated   EVALUATION COMPLEXITY: Low     GOALS: Goals reviewed with patient? No   SHORT TERM GOALS: Target date: 05/27/2023   Patient to demonstrate independence in HEP  Baseline: [id number] Goal status: MET   2.  295ft ambulation with SPC and S Baseline: 56ft with SPC and SBA; 24ft with RW and SBA; 06/08/23 239ft Goal status: Met       LONG TERM GOALS: Target date: 07/18/2023     Increase BLE strength to 4/5 Baseline:  MMT Right eval Left eval R 04/28/23 L 6//24  Hip flexion 4- 4- 3+ 3+  Hip extension 4- 4- 3+ 3+  Hip abduction 4- 4- 3+ 3+  Hip adduction        Hip internal rotation        Hip external rotation        Knee flexion 4- 4- 3+ 3+  Knee extension 4- 4- 3+ 3+  Ankle dorsiflexion        Ankle plantarflexion 4- 4- 3 3    Goal status: INITIAL   2.  Increase BERG score to 50 Baseline: 44 Goal status: INITIAL   3.  Increase 30s chair stand test to 10 reps w/UE support Baseline: 7 stands with UE support; 04/28/23  4 stands with UE support Goal status: INITIAL   4.  Patient to negotiate 16 steps with single rail, cane and appropriate step pattern Baseline: TBD Goal status: INITIAL  5.  Increase FOTO score to 68% Baseline: 45% Goal status: INITIAL         PLAN:   PT FREQUENCY: 1-2x/week   PT DURATION: 8 weeks   PLANNED INTERVENTIONS: Therapeutic exercises, Therapeutic activity, Neuromuscular re-education, Balance training, Gait training, Patient/Family education, Self Care, Joint mobilization, Stair training, DME instructions, Manual therapy, and Re-evaluation   PLAN FOR NEXT SESSION: HEP review and update, manual techniques as appropriate, aerobic tasks, ROM and flexibility activities, strengthening and PREs, TPDN, gait and balance training as needed       Hildred Laser, PT 06/08/2023, 3:43 PM

## 2023-06-08 ENCOUNTER — Ambulatory Visit: Payer: Medicare Other

## 2023-06-08 DIAGNOSIS — R5381 Other malaise: Secondary | ICD-10-CM

## 2023-06-08 DIAGNOSIS — R278 Other lack of coordination: Secondary | ICD-10-CM | POA: Diagnosis not present

## 2023-06-08 DIAGNOSIS — R2681 Unsteadiness on feet: Secondary | ICD-10-CM | POA: Diagnosis not present

## 2023-06-08 DIAGNOSIS — M25632 Stiffness of left wrist, not elsewhere classified: Secondary | ICD-10-CM | POA: Diagnosis not present

## 2023-06-08 DIAGNOSIS — R29898 Other symptoms and signs involving the musculoskeletal system: Secondary | ICD-10-CM | POA: Diagnosis not present

## 2023-06-08 DIAGNOSIS — M6281 Muscle weakness (generalized): Secondary | ICD-10-CM

## 2023-06-09 ENCOUNTER — Ambulatory Visit: Payer: Medicare Other | Admitting: Occupational Therapy

## 2023-06-09 ENCOUNTER — Encounter: Payer: Self-pay | Admitting: Nurse Practitioner

## 2023-06-09 ENCOUNTER — Ambulatory Visit: Payer: Medicare Other | Admitting: Nurse Practitioner

## 2023-06-09 VITALS — BP 110/66 | HR 73 | Temp 98.2°F | Ht 64.0 in | Wt 146.0 lb

## 2023-06-09 DIAGNOSIS — M5442 Lumbago with sciatica, left side: Secondary | ICD-10-CM | POA: Diagnosis not present

## 2023-06-09 DIAGNOSIS — M25632 Stiffness of left wrist, not elsewhere classified: Secondary | ICD-10-CM

## 2023-06-09 DIAGNOSIS — M6281 Muscle weakness (generalized): Secondary | ICD-10-CM

## 2023-06-09 DIAGNOSIS — I495 Sick sinus syndrome: Secondary | ICD-10-CM

## 2023-06-09 DIAGNOSIS — R29898 Other symptoms and signs involving the musculoskeletal system: Secondary | ICD-10-CM | POA: Diagnosis not present

## 2023-06-09 DIAGNOSIS — R5381 Other malaise: Secondary | ICD-10-CM | POA: Diagnosis not present

## 2023-06-09 DIAGNOSIS — E2740 Unspecified adrenocortical insufficiency: Secondary | ICD-10-CM | POA: Diagnosis not present

## 2023-06-09 DIAGNOSIS — R278 Other lack of coordination: Secondary | ICD-10-CM | POA: Diagnosis not present

## 2023-06-09 DIAGNOSIS — R2681 Unsteadiness on feet: Secondary | ICD-10-CM | POA: Diagnosis not present

## 2023-06-09 NOTE — Assessment & Plan Note (Signed)
Patient is being followed by endocrinology and they are weaning her off Cortef currently.  Continue following with  endocrinology

## 2023-06-09 NOTE — Patient Instructions (Addendum)
Nice to see you today Reach out to the liver clinic for an updated prescription for the Aldatone Follow up with me in 3 months for your physical and labs. Ok to cancel the 06/21/2023 appointment

## 2023-06-09 NOTE — Progress Notes (Signed)
Established Patient Office Visit  Subjective   Patient ID: [name], female    DOB: 1951-06-01  Age: 72 y.o. MRN: [id number]  Chief Complaint  Patient presents with   Edema    Pt states she feels better with edema. States that medicine helps.    Medication Problem    Pt has gone down to 1 tablet a day for CORTEF instructed by endocrinologist. Pt is weaning off. Aldactone is expired and pt wants to know if okay to use.     HPI  Patient was seen by me on 05/07/2023 for hospital follow-up.  Patient was having some swelling of the legs that have been improving and was in physical therapy for reconditioning.  Of note patient was bradycardic and physical therapy was concerned.  I cleared patient to continue physical therapy at her stamina and endurance level.  She is here today for follow-up     Review of Systems  Constitutional:  Positive for malaise/fatigue. Negative for chills and fever.  Respiratory:  Negative for shortness of breath.   Cardiovascular:  Positive for leg swelling. Negative for chest pain.  Neurological:  Negative for headaches.  Psychiatric/Behavioral:  Negative for hallucinations and suicidal ideas.       Objective:     BP 110/66   Pulse 73   Temp 98.2 F (36.8 C) (Temporal)   Ht 5\' 4"  (1.626 m)   Wt 146 lb (66.2 kg)   SpO2 96%   BMI 25.06 kg/m  BP Readings from Last 3 Encounters:  06/09/23 110/66  05/21/23 (!) 140/70  05/10/23 125/60   Wt Readings from Last 3 Encounters:  06/09/23 146 lb (66.2 kg)  05/21/23 141 lb 12.8 oz (64.3 kg)  05/10/23 142 lb 3.2 oz (64.5 kg)      Physical Exam Vitals and nursing note reviewed.  Constitutional:      Appearance: Normal appearance.  Cardiovascular:     Rate and Rhythm: Normal rate and regular rhythm.     Heart sounds: Normal heart sounds.  Pulmonary:     Effort: Pulmonary effort is normal.     Breath sounds: Normal breath sounds.  Musculoskeletal:        General: Tenderness present.      Lumbar back: Tenderness present. No bony tenderness. Positive right straight leg raise test. Negative left straight leg raise test.       Back:     Right lower leg: No edema (non pitting).     Left lower leg: No edema (non pitting).  Neurological:     Mental Status: She is alert.      No results found for any visits on 06/09/23.    The ASCVD Risk score (Arnett DK, et al., 2019) failed to calculate for the following reasons:   Cannot find a previous HDL lab   Cannot find a previous total cholesterol lab    Assessment & Plan:   Problem List Items Addressed This Visit       Cardiovascular and Mediastinum   Tachycardia-bradycardia syndrome [facility]) - Primary    Patient currently followed by cardiology.  Pulse within normal limits today.  Continue following cardiology as recommended        Endocrine   Adrenal insufficiency [facility])    Patient is being followed by endocrinology and they are weaning her off Cortef currently.  Continue following with  endocrinology        Other   Acute back pain with sciatica, left    Has  been present since hospitalization.  PT scared to move forward as he has a concern patient is unsure what the exact concern is does seem like sciatica in office will put off exercises with modification.  Patient cleared to do physical therapy at the physical therapist discretion       Return in about 3 months (around 09/09/2023) for CPE and Labs.    Audria Nine, NP

## 2023-06-09 NOTE — Assessment & Plan Note (Signed)
Has been present since hospitalization.  PT scared to move forward as he has a concern patient is unsure what the exact concern is does seem like sciatica in office will put off exercises with modification.  Patient cleared to do physical therapy at the physical therapist discretion

## 2023-06-09 NOTE — Assessment & Plan Note (Signed)
Patient currently followed by cardiology.  Pulse within normal limits today.  Continue following cardiology as recommended

## 2023-06-09 NOTE — Therapy (Signed)
OUTPATIENT OCCUPATIONAL THERAPY ORTHO TREATMENT  Patient Name: [name] MRN: [id number] DOB:1950-12-15, 72 y.o., female Today's Date: 06/09/2023  PCP: Eden Emms, NP  REFERRING PROVIDER: Marlyne Beards, MD  END OF SESSION:  OT End of Session - 06/09/23 1227     Visit Number 9    Number of Visits 13    Date for OT Re-Evaluation 06/25/23    Authorization Type U[employer] Medicare    Progress Note Due on Visit 20    OT Start Time 1230    OT Stop Time 1315    OT Time Calculation (min) 45 min    Equipment Utilized During Biomedical engineer, home mngt objects    Activity Tolerance Patient tolerated treatment well;No increased pain    Behavior During Therapy [facility] for tasks assessed/performed             Past Medical History:  Diagnosis Date   Acute urinary retention 05/04/2022   Allergy 2006 ?   Contrast dye   Arthritis 2016 ??   Knees and thumb   Cancer (HCC)    cecum   Cataract 2021   Surgery scheduled July 2023   Colon cancer [facility]) 2003   Elevated liver function tests    Esophageal varices (HCC)    Heart murmur On file   Hemorrhage of gastrointestinal tract 05/04/2011   Hypertension 2021   Hypothyroidism    Iron deficiency anemia    Liver disease    chemotherapy complication, per pt, shunts placed to bypass liver   Malignant neoplasm of cecum (HCC)    Portal hypertension (HCC)    Skin cancer 2019   Splenomegaly    Past Surgical History:  Procedure Laterality Date   COLON SURGERY  2004   Cancer   COSMETIC SURGERY  2021   Skin cancer   ESOPHAGEAL VARICE LIGATION     EYE SURGERY     HEMICOLECTOMY  01/08/2003   IR RADIOLOGIST EVAL & MGMT  12/20/2020   IR RADIOLOGIST EVAL & MGMT  05/29/2021   LIVER SURGERY     shunts placed after chemo complication   SKIN FULL THICKNESS GRAFT N/A 09/12/2019   Procedure: debridement and FTSG to the nose from left upper arm;  Surgeon: Allena Napoleon, MD;  Location: [facility];  Service:  Plastics;  Laterality: N/A;  2 hours, please   TIPS PROCEDURE     Patient Active Problem List   Diagnosis Date Noted   Bradycardia 05/07/2023   Sepsis due to group B Streptococcus with acute renal failure and septic shock (HCC) 04/24/2023   Septic shock (HCC) 04/21/2023   Adrenal insufficiency (HCC) 03/19/2023   Protein-calorie malnutrition, severe (HCC) 02/26/2023   Acute hepatic encephalopathy (HCC) 02/25/2023   Weight loss 02/24/2023   Bowel habit changes 02/24/2023   Generalized abdominal pain 02/24/2023   Hospital discharge follow-up 02/24/2023   Weakness 01/04/2023   Frequent UTI 05/18/2022   Anemia 05/18/2022   AKI (acute kidney injury) (HCC) 04/30/2022   Tachycardia-bradycardia syndrome (HCC) 02/05/2022   Hepatic encephalopathy (HCC) 02/03/2022   Basal cell carcinoma (BCC) 05/06/2021   Cecal cancer (HCC) 05/06/2021   Hypertension 05/06/2021   COVID-19 vaccination declined 01/30/2021   Decompensated liver cirrhosis with portal HTN and gastric varices 12/04/2020   Essential hypertension 08/12/2020   Murmur, cardiac 03/19/2020   Hypothyroidism 06/30/2019   History of basal cell cancer 06/30/2019   Hx of colon cancer, stage III 11/30/2011   Esophageal varices (HCC) 06/12/2010   Portal  hypertension (HCC) 01/23/2008    ONSET DATE: 01/29/2023  REFERRING DIAG: fracture of distal end of radius left  THERAPY DIAG:  Stiffness of left wrist, not elsewhere classified  Other lack of coordination  Muscle weakness (generalized)  Rationale for Evaluation and Treatment: Rehabilitation  SUBJECTIVE:  SUBJECTIVE STATEMENT:  Pt reported her hand is feeling well and she arrived today without wrist brace in place.  Spouse reports that flexion has improved but she still has trouble with wrist extension ie) for pushing to stand. She also still can't open a child proof medicine bottle.  Return to orthopedist 06/28/23 and has PCP appt tomorrow.  Pt accompanied by: significant other -  [name]  PERTINENT HISTORY:  Closed, L distal radius fracture, managing conservatively due to health issues. She is not a candidate for surgery.  Follows up with ortho 06/28/2023. Referred to OPOT for eval and treat.    PRECAUTIONS: Other: to tolerance  - wear brace with strenuous activity  WEIGHT BEARING RESTRICTIONS:  to tolerance LUE  PAIN:  Are you having pain? No  FALLS: Has patient fallen in last 6 months? Yes. 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 navy officer and son-in Social worker is Runner, broadcasting/film/video); was driving   PATIENT GOALS: return to [employer]   OBJECTIVE:   HAND DOMINANCE: Right  ADLs: Overall ADLs: mod I  MOBILITY STATUS: Needs Assist: use of cane  ACTIVITY TOLERANCE: Activity tolerance: fair  FUNCTIONAL OUTCOME MEASURES: Quick Dash: 29.5% disability  - See image/specific items on eval 05/12/23  UPPER EXTREMITY ROM:     AROM Right (eval) Left (eval) Left 7/3  Shoulder flexion WNL    Shoulder abduction WNL    Elbow flexion WNL    Elbow extension WNL    Wrist flexion WNL 43* 50*  Wrist extension WNL 39* 38*  Wrist pronation WNL    Wrist supination WNL     Digit Composite Flexion WNL Lacks 4 cm index; 5 cm middle; 4 cm ring; 3.5 cm pinky Lacks 2 cm index; 3 cm middle;  ring and pinky - full  Digit Composite Extension WNL    Digit Opposition WNL WFL WNL  (Blank rows = not tested)  RUE IR limited to just behind hip  UPPER EXTREMITY MMT:     BUE: WFL shoulders and elbows  HAND FUNCTION: Grip strength: Right: 24.4 lbs; Left: 7.2 lbs  COORDINATION: 9 Hole Peg test: Right: 28 sec;  Left: 38 sec  SENSATION: WFL  EDEMA: none reported or observed  COGNITION: Overall cognitive status: History of cognitive impairments - at baseline  OBSERVATIONS: Pt appears well-kept. Ambulates with cane. No LOB though slow, altered gait.   TODAY'S TREATMENT:                                                                                                                               -  Self Care  OT reviewed putty HEP from last session as noted in pt instructions. Pt required min cueing for proper execution.   - Therapeutic activities completed for duration as noted below including:  Patient engaged in review of OT goals and ROM/coordination for upcoming progress note.  Improvements noted in   PATIENT EDUCATION: Education details: Functional LUE use with home management and some joint protection initiated Person educated: Patient and Spouse Education method: Explanation, Demonstration, Tactile cues, and Verbal cues Education comprehension: verbalized understanding, returned demonstration, verbal cues required, tactile cues required, and needs further education  HOME EXERCISE PROGRAM: 05/17/23 - Wrist ROM Access Code: [facility]  05/19/23 - Coordination Activities Handout with images  05/24/23 - Intro Putty Exercises Access Code: [email or web address]  06/02/2023 - red putty (updated)   GOALS:  SHORT TERM GOALS: Target date: 06/09/2023    Pt will demonstrate full composite flexion of L digits.  Baseline: Lacks 4 cm index; 5 cm middle; 4 cm ring; 3.5 cm Goal status: IN PROGRESS 06/09/23 Distance from palm still lacking for first 2 fingers: 2.5 cm index; 1.5 cm middle; ring and pinkie can touch palm  2.  Pt will demonstrate L internal rotation to middle of spine to aid with ADL completion. Baseline: just behind hip Goal status: MET   LONG TERM GOALS: Target date: 06/25/2023    Patient will demonstrate LUE HEP with 25% verbal cues or less for proper execution. Baseline:  Goal status: IN  PROGRESS  2.  Patient will demonstrate at least 16% improvement with quick Dash score (reporting 13.5% disability or less) indicating improved functional use of affected extremity (opening containers, washing back, carrying objects/bags). Baseline: 29.5% disability with use of LUE Goal status: IN PROGRESS  3.  Pt will demonstrate at least 120* combined L wrist ext/flex AROM.  Baseline: L wrist ext - 39*; flex - 43* Goal status: IN PROGRESS 06/09/23: Wrist ext - 45* flex - 50*  4.  Patient will demonstrate at least 17.2 lbs L grip strength as needed to open jars and other containers. Baseline: 7.2 lbs 06/02/2023: 19.8lbs Goal status: MET 06/09/23 - still can't open a child proof medicine bottle  5.  Patient will complete nine-hole peg with use of L in 30 seconds or less. Baseline: 38 seconds Goal status: IN PROGRESS 06/09/23: 36:35 seconds    ASSESSMENT:  CLINICAL IMPRESSION: Pt demonstrates ongoing limitations with L wrist extension and full functional grip/tight finger flexion position.  She did not have her brace on today and was responsive to functional use of arm for IADL tasks with introduction of modifications to improve ease with tasks such as emptying pot of water etc.  She is encouraged to work on simulated tasks with hair dryer, pot with cool water etc. Pt continues to benefit from skilled OT services in the outpatient setting to work on UE impairments in ROM, strength and coordination to help pt return to PLOF as able.  Pt will progress towards goals at a faster rate if she continues to increase use of L hand with functional tasks as presented today.  PERFORMANCE DEFICITS: in functional skills including ADLs, IADLs, coordination, ROM, strength, pain, Fine motor control, and UE functional use, cognitive skills including memory and thought  IMPAIRMENTS: are limiting patient from ADLs, IADLs, and work.   CO-MORBIDITIES: may have co-morbidities  that affects occupational  performance. Patient will benefit from skilled OT to address above impairments and improve overall function.  REHAB POTENTIAL: Good   PLAN:  OT FREQUENCY: 2x/week  OT DURATION: 6 weeks  PLANNED INTERVENTIONS: self care/ADL training, therapeutic exercise, therapeutic activity, manual therapy, passive range of motion, splinting, ultrasound, paraffin, fluidotherapy, moist heat, contrast bath, patient/family education, DME and/or AE instructions, and Re-evaluation  RECOMMENDED OTHER SERVICES: none at this time  CONSULTED AND AGREED WITH PLAN OF CARE: Patient and family member/caregiver  PLAN FOR NEXT SESSION:  Functional use of LUE in kitchen/laundry area ie) moving cans etc  Paraffin ?  Modalities (fluidotherapy ONLY if skin on L forearm intact/US) for progressing P/AROM and stretching wrist .   Victorino Sparrow, OT 06/09/2023, 1:20 PM

## 2023-06-10 ENCOUNTER — Ambulatory Visit: Payer: Medicare Other

## 2023-06-10 ENCOUNTER — Encounter: Payer: Self-pay | Admitting: Pharmacist

## 2023-06-10 DIAGNOSIS — M6281 Muscle weakness (generalized): Secondary | ICD-10-CM

## 2023-06-10 DIAGNOSIS — M25632 Stiffness of left wrist, not elsewhere classified: Secondary | ICD-10-CM | POA: Diagnosis not present

## 2023-06-10 DIAGNOSIS — R5381 Other malaise: Secondary | ICD-10-CM

## 2023-06-10 DIAGNOSIS — R278 Other lack of coordination: Secondary | ICD-10-CM | POA: Diagnosis not present

## 2023-06-10 DIAGNOSIS — R29898 Other symptoms and signs involving the musculoskeletal system: Secondary | ICD-10-CM | POA: Diagnosis not present

## 2023-06-10 DIAGNOSIS — R2681 Unsteadiness on feet: Secondary | ICD-10-CM | POA: Diagnosis not present

## 2023-06-10 NOTE — Therapy (Signed)
OUTPATIENT PHYSICAL THERAPY TREATMENT NOTE   Patient Name: [name] MRN: [id number] DOB:February 19, 1951, 72 y.o., female Today's Date: 06/10/2023  PCP: Eden Emms, NP  REFERRING PROVIDER: Eden Emms, NP   END OF SESSION:   PT End of Session - 06/10/23 1439     Visit Number 14    Number of Visits 22    Date for PT Re-Evaluation 06/28/23    Authorization Type UHC MCR    PT Start Time 1445    PT Stop Time 1525    PT Time Calculation (min) 40 min    Activity Tolerance Patient limited by pain;Treatment limited secondary to medical complications (Comment)    Behavior During Therapy [facility] for tasks assessed/performed              Past Medical History:  Diagnosis Date   Acute urinary retention 05/04/2022   Allergy 2006 ?   Contrast dye   Arthritis 2016 ??   Knees and thumb   Cancer (HCC)    cecum   Cataract 2021   Surgery scheduled July 2023   Colon cancer [facility]) 2003   Elevated liver function tests    Esophageal varices (HCC)    Heart murmur On file   Hemorrhage of gastrointestinal tract 05/04/2011   Hypertension 2021   Hypothyroidism    Iron deficiency anemia    Liver disease    chemotherapy complication, per pt, shunts placed to bypass liver   Malignant neoplasm of cecum (HCC)    Portal hypertension (HCC)    Skin cancer 2019   Splenomegaly    Past Surgical History:  Procedure Laterality Date   COLON SURGERY  2004   Cancer   COSMETIC SURGERY  2021   Skin cancer   ESOPHAGEAL VARICE LIGATION     EYE SURGERY     HEMICOLECTOMY  01/08/2003   IR RADIOLOGIST EVAL & MGMT  12/20/2020   IR RADIOLOGIST EVAL & MGMT  05/29/2021   LIVER SURGERY     shunts placed after chemo complication   SKIN FULL THICKNESS GRAFT N/A 09/12/2019   Procedure: debridement and FTSG to the nose from left upper arm;  Surgeon: Allena Napoleon, MD;  Location: [facility];  Service: Plastics;  Laterality: N/A;  2 hours, please   TIPS PROCEDURE     Patient Active  Problem List   Diagnosis Date Noted   Acute back pain with sciatica, left 06/09/2023   Bradycardia 05/07/2023   Sepsis due to group B Streptococcus with acute renal failure and septic shock (HCC) 04/24/2023   Septic shock (HCC) 04/21/2023   Adrenal insufficiency (HCC) 03/19/2023   Protein-calorie malnutrition, severe (HCC) 02/26/2023   Acute hepatic encephalopathy (HCC) 02/25/2023   Weight loss 02/24/2023   Bowel habit changes 02/24/2023   Generalized abdominal pain 02/24/2023   Hospital discharge follow-up 02/24/2023   Weakness 01/04/2023   Frequent UTI 05/18/2022   Anemia 05/18/2022   AKI (acute kidney injury) (HCC) 04/30/2022   Tachycardia-bradycardia syndrome (HCC) 02/05/2022   Hepatic encephalopathy (HCC) 02/03/2022   Basal cell carcinoma (BCC) 05/06/2021   Cecal cancer (HCC) 05/06/2021   Hypertension 05/06/2021   COVID-19 vaccination declined 01/30/2021   Decompensated liver cirrhosis with portal HTN and gastric varices 12/04/2020   Essential hypertension 08/12/2020   Murmur, cardiac 03/19/2020   Hypothyroidism 06/30/2019   History of basal cell cancer 06/30/2019   Hx of colon cancer, stage III 11/30/2011   Esophageal varices (HCC) 06/12/2010   Portal hypertension (HCC) 01/23/2008  REFERRING DIAG: R53.1 (ICD-10-CM) - Weakness   THERAPY DIAG:  Other lack of coordination  Muscle weakness (generalized)  Physical deconditioning  Rationale for Evaluation and Treatment Rehabilitation  PERTINENT HISTORY:  See PMH  PRECAUTIONS: Fall  SUBJECTIVE:                                                                                                                                                                                      SUBJECTIVE STATEMENT: Patient states that she did have some soreness and tiredness after Tuesday's session. She states that she saw her MD yesterday who believe she has sciatica. She has radiating pain down her RLE, almost to her knee.   PAIN:   Are you having pain? Yes (chronic LBP)   OBJECTIVE: (objective measures completed at initial evaluation unless otherwise dated)   DIAGNOSTIC FINDINGS: none   PATIENT SURVEYS:  FOTO 61(68 predicted); 04/28/23 45% 05/20/23 49%   MUSCLE LENGTH: Not tested   POSTURE: rounded shoulders, forward head, and increased thoracic kyphosis   PALPATION: Deferred based on Dx   LOWER EXTREMITY ROM: WFL for transfers and gait   Active ROM Right eval Left eval  Hip flexion      Hip extension      Hip abduction      Hip adduction      Hip internal rotation      Hip external rotation      Knee flexion      Knee extension      Ankle dorsiflexion      Ankle plantarflexion      Ankle inversion      Ankle eversion       (Blank rows = not tested)   LOWER EXTREMITY MMT:   MMT Right eval Left eval R 04/28/23 L 6//24  Hip flexion 4- 4- 3+ 3+  Hip extension 4- 4- 3+ 3+  Hip abduction 4- 4- 3+ 3+  Hip adduction        Hip internal rotation        Hip external rotation        Knee flexion 4- 4- 3+ 3+  Knee extension 4- 4- 3+ 3+  Ankle dorsiflexion        Ankle plantarflexion 4- 4- 3 3  Ankle inversion        Ankle eversion         (Blank rows = not tested)   LOWER EXTREMITY SPECIAL TESTS:  Deferred    FUNCTIONAL TESTS:  30 seconds chair stand test 7 stands with UE assist 04/28/23 4 stands with UE support 05/20/23 7 stands with UE Support   GAIT: Distance walked: 74ftx2 Assistive  device utilized: Single point cane; 04/28/23 RW Level of assistance: SBA Comments: slow cadence,  05/18/23 2 MWT 12ft w/cane  06/08/23 2 MWT 288ft with cane   Vital signs:  O2 sats 96% HR 58   TODAY'S TREATMENT:      [facility] Adult PT Treatment:                                                DATE: 06/10/23 Therapeutic Exercise: Static stand on airex 30s hold wide/narrow BOS, EO,EC 4 bouts Stepping and weight shifting at counter 4 in step 10/10 no UE support  Side stepping at countertop 3 trips with  fingertip support Tandem and retrowalking at counter 3 trips 2x5 STS from airex 5xSTS from Airex with OH reach   [facility] Adult PT Treatment:                                                DATE: 06/08/23 Therapeutic Exercise: Static stand on airex 30s hold wide/narrow BOS, EO,EC 4 bouts Stepping and weight shifting at counter 4 in step 10/10 no UE support  Side stepping at countertop 3 trips with fingertip support Tandem and retrowalking at counter 3 trips 5x STS from [employer] 3# with adduction 15x  FAQs 3# alternating 15/15  [facility] Adult PT Treatment:                                                DATE: 06/04/23 Therapeutic Exercise: Seated latissimus press 3s 10x Seated lat press with FAQs 10/10 Seated lat press with marching 10/10 Standing abduction with foam roll support 10/10 CGA Nustep L1 8 min 5x STS from airex STS 2x5      PATIENT EDUCATION:  Education details: Discussed eval findings, rehab rationale and POC and patient is in agreement  Person educated: Patient Education method: Explanation Education comprehension: verbalized understanding and needs further education   HOME EXERCISE PROGRAM: Access Code: [email or web address] URL: [email or web address] Date: 04/06/2023 Prepared by: Gustavus Bryant   Exercises - Sit to Stand with Armchair  - 2 x daily - 5 x weekly - 1 sets - 5 reps - Standing Heel Raise with Support  - 2 x daily - 5 x weekly - 1-2 sets - 10 reps   ASSESSMENT:   CLINICAL IMPRESSION:  Patient presents to PT reporting continued issues with chronic LBP and states her MD yesterday said that she most likely has sciatica. Session today continued to challenge balance and proprioception with decreasing UE support. Patient continues to benefit from skilled PT services and should be progressed as able to improve functional independence.   OBJECTIVE IMPAIRMENTS: Abnormal gait, decreased activity tolerance, decreased balance, decreased endurance, decreased  mobility, difficulty walking, decreased strength, and postural dysfunction.    ACTIVITY LIMITATIONS: carrying, lifting, squatting, stairs, and locomotion level   PARTICIPATION LIMITATIONS: meal prep, cleaning, laundry, shopping, community activity, and yard work   PERSONAL FACTORS: Fitness, Past/current experiences, Time since onset of injury/illness/exacerbation, and 1 comorbidity: hepatic cirrhosis   are also affecting patient's functional outcome.    REHAB POTENTIAL: Good   CLINICAL DECISION MAKING: Stable/uncomplicated  EVALUATION COMPLEXITY: Low     GOALS: Goals reviewed with patient? No   SHORT TERM GOALS: Target date: 05/27/2023   Patient to demonstrate independence in HEP  Baseline: [id number] Goal status: MET   2.  241ft ambulation with SPC and S Baseline: 23ft with SPC and SBA; 58ft with RW and SBA; 06/08/23 284ft Goal status: Met       LONG TERM GOALS: Target date: 07/18/2023     Increase BLE strength to 4/5 Baseline:  MMT Right eval Left eval R 04/28/23 L 6//24  Hip flexion 4- 4- 3+ 3+  Hip extension 4- 4- 3+ 3+  Hip abduction 4- 4- 3+ 3+  Hip adduction        Hip internal rotation        Hip external rotation        Knee flexion 4- 4- 3+ 3+  Knee extension 4- 4- 3+ 3+  Ankle dorsiflexion        Ankle plantarflexion 4- 4- 3 3    Goal status: INITIAL   2.  Increase BERG score to 50 Baseline: 44 Goal status: INITIAL   3.  Increase 30s chair stand test to 10 reps w/UE support Baseline: 7 stands with UE support; 04/28/23 4 stands with UE support Goal status: INITIAL   4.  Patient to negotiate 16 steps with single rail, cane and appropriate step pattern Baseline: TBD Goal status: INITIAL  5.  Increase FOTO score to 68% Baseline: 45% Goal status: INITIAL         PLAN:   PT FREQUENCY: 1-2x/week   PT DURATION: 8 weeks   PLANNED INTERVENTIONS: Therapeutic exercises, Therapeutic activity, Neuromuscular re-education, Balance training, Gait  training, Patient/Family education, Self Care, Joint mobilization, Stair training, DME instructions, Manual therapy, and Re-evaluation   PLAN FOR NEXT SESSION: HEP review and update, manual techniques as appropriate, aerobic tasks, ROM and flexibility activities, strengthening and PREs, TPDN, gait and balance training as needed       Berta Minor, PTA 06/10/2023, 3:34 PM

## 2023-06-10 NOTE — Progress Notes (Signed)
Patient previously followed by [facility] pharmacist. Per clinical review, no pharmacist appointment needed at this time. Care guide directed to contact patient and cancel appointment and notify pharmacy team of any patient concerns.  

## 2023-06-14 ENCOUNTER — Other Ambulatory Visit: Payer: Self-pay | Admitting: Nurse Practitioner

## 2023-06-14 ENCOUNTER — Ambulatory Visit: Payer: Medicare Other | Admitting: Occupational Therapy

## 2023-06-14 ENCOUNTER — Encounter: Payer: Medicare Other | Admitting: Pharmacist

## 2023-06-14 DIAGNOSIS — M25632 Stiffness of left wrist, not elsewhere classified: Secondary | ICD-10-CM

## 2023-06-14 DIAGNOSIS — R29898 Other symptoms and signs involving the musculoskeletal system: Secondary | ICD-10-CM | POA: Diagnosis not present

## 2023-06-14 DIAGNOSIS — M6281 Muscle weakness (generalized): Secondary | ICD-10-CM

## 2023-06-14 DIAGNOSIS — R278 Other lack of coordination: Secondary | ICD-10-CM | POA: Diagnosis not present

## 2023-06-14 DIAGNOSIS — R2681 Unsteadiness on feet: Secondary | ICD-10-CM | POA: Diagnosis not present

## 2023-06-14 DIAGNOSIS — R5381 Other malaise: Secondary | ICD-10-CM | POA: Diagnosis not present

## 2023-06-14 NOTE — Therapy (Unsigned)
OUTPATIENT PHYSICAL THERAPY TREATMENT NOTE   Patient Name: [name] MRN: [id number] DOB:1951-10-22, 72 y.o., female Today's Date: 06/15/2023  PCP: Eden Emms, NP  REFERRING PROVIDER: Eden Emms, NP   END OF SESSION:   PT End of Session - 06/15/23 1310     Visit Number 15    Number of Visits 22    Date for PT Re-Evaluation 06/28/23    Authorization Type UHC MCR    PT Start Time 1315    PT Stop Time 1355    PT Time Calculation (min) 40 min    Activity Tolerance Patient limited by pain;Treatment limited secondary to medical complications (Comment)    Behavior During Therapy [facility] for tasks assessed/performed               Past Medical History:  Diagnosis Date   Acute urinary retention 05/04/2022   Allergy 2006 ?   Contrast dye   Arthritis 2016 ??   Knees and thumb   Cancer (HCC)    cecum   Cataract 2021   Surgery scheduled July 2023   Colon cancer [facility]) 2003   Elevated liver function tests    Esophageal varices (HCC)    Heart murmur On file   Hemorrhage of gastrointestinal tract 05/04/2011   Hypertension 2021   Hypothyroidism    Iron deficiency anemia    Liver disease    chemotherapy complication, per pt, shunts placed to bypass liver   Malignant neoplasm of cecum (HCC)    Portal hypertension (HCC)    Skin cancer 2019   Splenomegaly    Past Surgical History:  Procedure Laterality Date   COLON SURGERY  2004   Cancer   COSMETIC SURGERY  2021   Skin cancer   ESOPHAGEAL VARICE LIGATION     EYE SURGERY     HEMICOLECTOMY  01/08/2003   IR RADIOLOGIST EVAL & MGMT  12/20/2020   IR RADIOLOGIST EVAL & MGMT  05/29/2021   LIVER SURGERY     shunts placed after chemo complication   SKIN FULL THICKNESS GRAFT N/A 09/12/2019   Procedure: debridement and FTSG to the nose from left upper arm;  Surgeon: Allena Napoleon, MD;  Location: [facility];  Service: Plastics;  Laterality: N/A;  2 hours, please   TIPS PROCEDURE     Patient Active  Problem List   Diagnosis Date Noted   Acute back pain with sciatica, left 06/09/2023   Bradycardia 05/07/2023   Sepsis due to group B Streptococcus with acute renal failure and septic shock (HCC) 04/24/2023   Septic shock (HCC) 04/21/2023   Adrenal insufficiency (HCC) 03/19/2023   Protein-calorie malnutrition, severe (HCC) 02/26/2023   Acute hepatic encephalopathy (HCC) 02/25/2023   Weight loss 02/24/2023   Bowel habit changes 02/24/2023   Generalized abdominal pain 02/24/2023   Hospital discharge follow-up 02/24/2023   Weakness 01/04/2023   Frequent UTI 05/18/2022   Anemia 05/18/2022   AKI (acute kidney injury) (HCC) 04/30/2022   Tachycardia-bradycardia syndrome (HCC) 02/05/2022   Hepatic encephalopathy (HCC) 02/03/2022   Basal cell carcinoma (BCC) 05/06/2021   Cecal cancer (HCC) 05/06/2021   Hypertension 05/06/2021   COVID-19 vaccination declined 01/30/2021   Decompensated liver cirrhosis with portal HTN and gastric varices 12/04/2020   Essential hypertension 08/12/2020   Murmur, cardiac 03/19/2020   Hypothyroidism 06/30/2019   History of basal cell cancer 06/30/2019   Hx of colon cancer, stage III 11/30/2011   Esophageal varices (HCC) 06/12/2010   Portal hypertension (HCC)  01/23/2008    REFERRING DIAG: R53.1 (ICD-10-CM) - Weakness   THERAPY DIAG:  Muscle weakness (generalized)  Physical deconditioning  Unsteadiness on feet  Rationale for Evaluation and Treatment Rehabilitation  PERTINENT HISTORY:  See PMH  PRECAUTIONS: Fall  SUBJECTIVE:                                                                                                                                                                                      SUBJECTIVE STATEMENT: Has not been able to perform exercises given for sciatica as she has a lot to do.  PAIN:  Are you having pain? Yes (chronic LBP)   OBJECTIVE: (objective measures completed at initial evaluation unless otherwise  dated)   DIAGNOSTIC FINDINGS: none   PATIENT SURVEYS:  FOTO 61(68 predicted); 04/28/23 45% 05/20/23 49% 06/15/23 48%   MUSCLE LENGTH: Not tested   POSTURE: rounded shoulders, forward head, and increased thoracic kyphosis   PALPATION: Deferred based on Dx   LOWER EXTREMITY ROM: WFL for transfers and gait   Active ROM Right eval Left eval  Hip flexion      Hip extension      Hip abduction      Hip adduction      Hip internal rotation      Hip external rotation      Knee flexion      Knee extension      Ankle dorsiflexion      Ankle plantarflexion      Ankle inversion      Ankle eversion       (Blank rows = not tested)   LOWER EXTREMITY MMT:   MMT Right eval Left eval R 04/28/23 L 6//24  Hip flexion 4- 4- 3+ 3+  Hip extension 4- 4- 3+ 3+  Hip abduction 4- 4- 3+ 3+  Hip adduction        Hip internal rotation        Hip external rotation        Knee flexion 4- 4- 3+ 3+  Knee extension 4- 4- 3+ 3+  Ankle dorsiflexion        Ankle plantarflexion 4- 4- 3 3  Ankle inversion        Ankle eversion         (Blank rows = not tested)   LOWER EXTREMITY SPECIAL TESTS:  Deferred    FUNCTIONAL TESTS:  30 seconds chair stand test 7 stands with UE assist 04/28/23 4 stands with UE support 05/20/23 7 stands with UE Support   GAIT: Distance walked: 69ftx2 Assistive device utilized: Single point cane; 04/28/23 RW Level of assistance: SBA Comments: slow cadence,  05/18/23  2 MWT 152ft w/cane  06/08/23 2 MWT 245ft with cane   Vital signs:  O2 sats 96% HR 58   TODAY'S TREATMENT:      [facility] Adult PT Treatment:                                                DATE: 06/15/23  Therapeutic Activity:  Standing abduction 10/10 with foam roll support    06/15/23 0001  Berg Balance Test  Sit to Stand 3  Standing Unsupported 4  Sitting with Back Unsupported but Feet Supported on Floor or Stool 4  Stand to Sit 3  Transfers 3  Standing Unsupported with Eyes Closed 4  Standing  Unsupported with Feet Together 4  From Standing, Reach Forward with Outstretched Arm 4  From Standing Position, Pick up Object from Floor 3  From Standing Position, Turn to Look Behind Over each Shoulder 4  Turn 360 Degrees 2  Standing Unsupported, Alternately Place Feet on Step/Stool 3  Standing Unsupported, One Foot in Front 3  Standing on One Leg 3  Total Score 47   16 Steps with cane using step to pattern   [facility] Adult PT Treatment:                                                DATE: 06/10/23 Therapeutic Exercise: Static stand on airex 30s hold wide/narrow BOS, EO,EC 4 bouts Stepping and weight shifting at counter 4 in step 10/10 no UE support  Side stepping at countertop 3 trips with fingertip support Tandem and retrowalking at counter 3 trips 2x5 STS from airex 5xSTS from Airex with OH reach   [facility] Adult PT Treatment:                                                DATE: 06/08/23 Therapeutic Exercise: Static stand on airex 30s hold wide/narrow BOS, EO,EC 4 bouts Stepping and weight shifting at counter 4 in step 10/10 no UE support  Side stepping at countertop 3 trips with fingertip support Tandem and retrowalking at counter 3 trips 5x STS from [employer] 3# with adduction 15x  FAQs 3# alternating 15/15  [facility] Adult PT Treatment:                                                DATE: 06/04/23 Therapeutic Exercise: Seated latissimus press 3s 10x Seated lat press with FAQs 10/10 Seated lat press with marching 10/10 Standing abduction with foam roll support 10/10 CGA Nustep L1 8 min 5x STS from airex STS 2x5      PATIENT EDUCATION:  Education details: Discussed eval findings, rehab rationale and POC and patient is in agreement  Person educated: Patient Education method: Explanation Education comprehension: verbalized understanding and needs further education   HOME EXERCISE PROGRAM: Access Code: [email or web address] URL: [email or web address] Date:  04/06/2023 Prepared by: Gustavus Bryant   Exercises - Sit to Stand with Armchair  - 2 x daily -  5 x weekly - 1 sets - 5 reps - Standing Heel Raise with Support  - 2 x daily - 5 x weekly - 1-2 sets - 10 reps   ASSESSMENT:   CLINICAL IMPRESSION: Focus of today was updating progress towards goals.  Patient able to negotiate 16 steps with rail, cane and step to pattern.  BERG score increased but FOTO score relatively unchanged.    OBJECTIVE IMPAIRMENTS: Abnormal gait, decreased activity tolerance, decreased balance, decreased endurance, decreased mobility, difficulty walking, decreased strength, and postural dysfunction.    ACTIVITY LIMITATIONS: carrying, lifting, squatting, stairs, and locomotion level   PARTICIPATION LIMITATIONS: meal prep, cleaning, laundry, shopping, community activity, and yard work   PERSONAL FACTORS: Fitness, Past/current experiences, Time since onset of injury/illness/exacerbation, and 1 comorbidity: hepatic cirrhosis   are also affecting patient's functional outcome.    REHAB POTENTIAL: Good   CLINICAL DECISION MAKING: Stable/uncomplicated   EVALUATION COMPLEXITY: Low     GOALS: Goals reviewed with patient? No   SHORT TERM GOALS: Target date: 05/27/2023   Patient to demonstrate independence in HEP  Baseline: [id number] Goal status: MET   2.  220ft ambulation with SPC and S Baseline: 2ft with SPC and SBA; 63ft with RW and SBA; 06/08/23 268ft Goal status: Met       LONG TERM GOALS: Target date: 07/18/2023     Increase BLE strength to 4/5 Baseline:  MMT Right eval Left eval R 04/28/23 L 6//24  Hip flexion 4- 4- 3+ 3+  Hip extension 4- 4- 3+ 3+  Hip abduction 4- 4- 3+ 3+  Hip adduction        Hip internal rotation        Hip external rotation        Knee flexion 4- 4- 3+ 3+  Knee extension 4- 4- 3+ 3+  Ankle dorsiflexion        Ankle plantarflexion 4- 4- 3 3    Goal status: INITIAL   2.  Increase BERG score to 50 Baseline: 44; 06/15/23  47 Goal status: Ongoing   3.  Increase 30s chair stand test to 10 reps w/UE support Baseline: 7 stands with UE support; 04/28/23 4 stands with UE support Goal status: Ongoing   4.  Patient to negotiate 16 steps with single rail, cane and appropriate step pattern Baseline: TBD; 06/15/23 16 steps with step to pattern single rail and cane Goal status: Ongoing  5.  Increase FOTO score to 68% Baseline: 45%; 06/15/23 48% Goal status: Ongoing         PLAN:   PT FREQUENCY: 1-2x/week   PT DURATION: 8 weeks   PLANNED INTERVENTIONS: Therapeutic exercises, Therapeutic activity, Neuromuscular re-education, Balance training, Gait training, Patient/Family education, Self Care, Joint mobilization, Stair training, DME instructions, Manual therapy, and Re-evaluation   PLAN FOR NEXT SESSION: HEP review and update, manual techniques as appropriate, aerobic tasks, ROM and flexibility activities, strengthening and PREs, TPDN, gait and balance training as needed       Hildred Laser, PT 06/15/2023, 3:59 PM

## 2023-06-14 NOTE — Therapy (Unsigned)
OUTPATIENT OCCUPATIONAL THERAPY ORTHO TREATMENT & PROGRESS NOTE  Patient Name: [name] MRN: [id number] DOB:04/12/1951, 72 y.o., female Today's Date: 06/14/2023  PCP: Eden Emms, NP  REFERRING PROVIDER: Marlyne Beards, MD  END OF SESSION:  OT End of Session - 06/14/23 1317     Visit Number 10    Number of Visits 13    Date for OT Re-Evaluation 06/25/23    Authorization Type UHC Medicare    Progress Note Due on Visit 20    OT Start Time 1319    OT Stop Time 1400    OT Time Calculation (min) 41 min    Equipment Utilized During Treatment Yellow therabar, jux-a-cisor, putty    Activity Tolerance Patient tolerated treatment well;No increased pain    Behavior During Therapy [facility] for tasks assessed/performed             Past Medical History:  Diagnosis Date   Acute urinary retention 05/04/2022   Allergy 2006 ?   Contrast dye   Arthritis 2016 ??   Knees and thumb   Cancer (HCC)    cecum   Cataract 2021   Surgery scheduled July 2023   Colon cancer [facility]) 2003   Elevated liver function tests    Esophageal varices (HCC)    Heart murmur On file   Hemorrhage of gastrointestinal tract 05/04/2011   Hypertension 2021   Hypothyroidism    Iron deficiency anemia    Liver disease    chemotherapy complication, per pt, shunts placed to bypass liver   Malignant neoplasm of cecum (HCC)    Portal hypertension (HCC)    Skin cancer 2019   Splenomegaly    Past Surgical History:  Procedure Laterality Date   COLON SURGERY  2004   Cancer   COSMETIC SURGERY  2021   Skin cancer   ESOPHAGEAL VARICE LIGATION     EYE SURGERY     HEMICOLECTOMY  01/08/2003   IR RADIOLOGIST EVAL & MGMT  12/20/2020   IR RADIOLOGIST EVAL & MGMT  05/29/2021   LIVER SURGERY     shunts placed after chemo complication   SKIN FULL THICKNESS GRAFT N/A 09/12/2019   Procedure: debridement and FTSG to the nose from left upper arm;  Surgeon: Allena Napoleon, MD;  Location: [facility];   Service: Plastics;  Laterality: N/A;  2 hours, please   TIPS PROCEDURE     Patient Active Problem List   Diagnosis Date Noted   Acute back pain with sciatica, left 06/09/2023   Bradycardia 05/07/2023   Sepsis due to group B Streptococcus with acute renal failure and septic shock (HCC) 04/24/2023   Septic shock (HCC) 04/21/2023   Adrenal insufficiency (HCC) 03/19/2023   Protein-calorie malnutrition, severe (HCC) 02/26/2023   Acute hepatic encephalopathy (HCC) 02/25/2023   Weight loss 02/24/2023   Bowel habit changes 02/24/2023   Generalized abdominal pain 02/24/2023   Hospital discharge follow-up 02/24/2023   Weakness 01/04/2023   Frequent UTI 05/18/2022   Anemia 05/18/2022   AKI (acute kidney injury) (HCC) 04/30/2022   Tachycardia-bradycardia syndrome (HCC) 02/05/2022   Hepatic encephalopathy (HCC) 02/03/2022   Basal cell carcinoma (BCC) 05/06/2021   Cecal cancer (HCC) 05/06/2021   Hypertension 05/06/2021   COVID-19 vaccination declined 01/30/2021   Decompensated liver cirrhosis with portal HTN and gastric varices 12/04/2020   Essential hypertension 08/12/2020   Murmur, cardiac 03/19/2020   Hypothyroidism 06/30/2019   History of basal cell cancer 06/30/2019   Hx of colon cancer, stage  III 11/30/2011   Esophageal varices (HCC) 06/12/2010   Portal hypertension (HCC) 01/23/2008    ONSET DATE: 01/29/2023  REFERRING DIAG: fracture of distal end of radius left  THERAPY DIAG:  Stiffness of left wrist, not elsewhere classified  Muscle weakness (generalized)  Other lack of coordination  Rationale for Evaluation and Treatment: Rehabilitation  SUBJECTIVE:  SUBJECTIVE STATEMENT:  Pt reported her hand is feeling well and she arrived today without the wrist brace in place again.    Return to orthopedist 06/28/23 and had PCP appt last week and return in October 2024  Pt accompanied by: significant other - [name]  PERTINENT HISTORY:  Closed, L distal radius fracture, managing  conservatively due to health issues. She is not a candidate for surgery.  Follows up with ortho 06/28/2023. Referred to OPOT for eval and treat.    PRECAUTIONS: Other: to tolerance  - wear brace with strenuous activity  WEIGHT BEARING RESTRICTIONS:  to tolerance LUE  PAIN:  Are you having pain? No  FALLS: Has patient fallen in last 6 months? Yes. 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 navy officer and son-in Social worker is Runner, broadcasting/film/video); was driving   PATIENT GOALS: return to [employer]   OBJECTIVE:   HAND DOMINANCE: Right  ADLs: Overall ADLs: mod I  MOBILITY STATUS: Needs Assist: use of cane  ACTIVITY TOLERANCE: Activity tolerance: fair  FUNCTIONAL OUTCOME MEASURES: Quick Dash: 29.5% disability  - See image/specific items on eval 05/12/23  UPPER EXTREMITY ROM:     AROM Right (eval) Left (eval) Left 7/3  Shoulder flexion WNL    Shoulder abduction WNL    Elbow flexion WNL    Elbow extension WNL    Wrist flexion WNL 43* 50*  Wrist extension WNL 39* 38*  Wrist pronation WNL    Wrist supination WNL     Digit Composite Flexion WNL Lacks 4 cm index; 5 cm middle; 4 cm ring; 3.5 cm pinky Lacks 2 cm index; 3 cm middle;  ring and pinky - full  Digit Composite Extension WNL    Digit Opposition WNL WFL WNL  (Blank rows = not tested)  RUE IR limited to just behind hip  UPPER EXTREMITY MMT:     BUE: WFL shoulders and elbows  HAND FUNCTION: Grip strength: Right: 24.4 lbs; Left: 7.2 lbs  COORDINATION: 9 Hole Peg test: Right: 28 sec; Left: 38 sec  SENSATION: WFL  EDEMA: none reported or  observed  COGNITION: Overall cognitive status: History of cognitive impairments - at baseline  OBSERVATIONS: Pt appears well-kept. Ambulates with cane. No LOB though slow, altered gait.   TODAY'S TREATMENT:                                                                                                                               -  Therapeutic exercises completed for duration as noted below including:  Yellow therabar twisting forward and back with limitations in wrist extension on L side. Ocillations with wrist in neutral, forearm pronation/supination  Juxicisor with L hand Wrist jux-a-cisor with use of left with cues to keep full grasp on base at all times x5 for improved wrist ROM and grip strength of affected extremity  In ahnd manipulation rotating balls with increased challenge with balls elevating forearm to faciitate morewrist extension to stabilize balls and prevent them form rollign out of her palm. Resisted clothespins inclung yellow to green placed vertically and then blue and black (highest  Putty for finding hidden objects  PATIENT EDUCATION: Education details: Functional LUE use with home management and some joint protection initiated Person educated: Patient and Spouse Education method: Explanation, Demonstration, Tactile cues, and Verbal cues Education comprehension: verbalized understanding, returned demonstration, verbal cues required, tactile cues required, and needs further education  HOME EXERCISE PROGRAM: 05/17/23 - Wrist ROM Access Code: [facility]  05/19/23 - Coordination Activities Handout with images  05/24/23 - Intro Putty Exercises Access Code: [email or web address]  06/02/2023 - red putty (updated)   GOALS:  SHORT TERM GOALS: Target date: 06/09/2023    Pt will demonstrate full composite flexion of L digits.  Baseline: Lacks 4 cm index; 5 cm middle; 4 cm ring; 3.5 cm Goal status: IN PROGRESS 06/09/23 Distance from palm still lacking for first 2 fingers: 2.5 cm  index; 1.5 cm middle; ring and pinkie can touch palm 06/14/23 Distance from palm still lacking for first 2 fingers: 1.5 cm index; 0.5 cm middle; ring and pinkie can touch palm  2.  Pt will demonstrate L internal rotation to middle of spine to aid with ADL completion. Baseline: just behind hip Goal status: MET   LONG TERM GOALS: Target date: 06/25/2023    Patient will demonstrate LUE HEP with 25% verbal cues or less for proper execution. Baseline:  Goal status: IN PROGRESS  2.  Patient will demonstrate at least 16% improvement with quick Dash score (reporting 13.5% disability or less) indicating improved functional use of affected extremity (opening containers, washing back, carrying objects/bags). Baseline: 29.5% disability with use of LUE Goal status: IN PROGRESS  3.  Pt will demonstrate at least 120* combined L wrist ext/flex AROM.  Baseline: L wrist ext - 39*; flex - 43* Goal status: IN PROGRESS 06/09/23: Wrist ext - 45* flex - 50* 06/14/23: wrist ext - 50* flex - 50*  4.  Patient will demonstrate at least 17.2 lbs L grip strength as needed to open jars and other containers. Baseline: 7.2 lbs 06/02/2023: 19.8lbs Goal status: MET 06/09/23 - still can't open a child proof medicine bottle  5.  Patient will complete nine-hole peg with use of L in 30 seconds or less. Baseline: 38 seconds Goal status: IN PROGRESS 06/09/23: 36:35 seconds    ASSESSMENT:  CLINICAL IMPRESSION: Pt demonstrates ongoing limitations with L wrist extension and full functional grip/tight finger flexion position.  She did not have her brace on today and was responsive to functional use of arm for IADL tasks with introduction of modifications to improve ease with tasks such as emptying pot of water etc.  She is encouraged to work on simulated tasks with hair dryer, pot with cool water etc. Pt continues to benefit from skilled OT services in the outpatient setting to work on UE impairments in ROM, strength and  coordination to help pt return to PLOF as able.  Pt will progress towards goals at  a faster rate if she continues to increase use of L hand with functional tasks as presented today.  PERFORMANCE DEFICITS: in functional skills including ADLs, IADLs, coordination, ROM, strength, pain, Fine motor control, and UE functional use, cognitive skills including memory and thought  IMPAIRMENTS: are limiting patient from ADLs, IADLs, and work.   CO-MORBIDITIES: may have co-morbidities  that affects occupational performance. Patient will benefit from skilled OT to address above impairments and improve overall function.  REHAB POTENTIAL: Good   PLAN:  OT FREQUENCY: 2x/week  OT DURATION: 6 weeks  PLANNED INTERVENTIONS: self care/ADL training, therapeutic exercise, therapeutic activity, manual therapy, passive range of motion, splinting, ultrasound, paraffin, fluidotherapy, moist heat, contrast bath, patient/family education, DME and/or AE instructions, and Re-evaluation  RECOMMENDED OTHER SERVICES: none at this time  CONSULTED AND AGREED WITH PLAN OF CARE: Patient and family member/caregiver  PLAN FOR NEXT SESSION:  Functional use of LUE in kitchen/laundry area ie) moving cans etc  Paraffin ?  Modalities (fluidotherapy ONLY if skin on L forearm intact/US) for progressing P/AROM and stretching wrist .   Victorino Sparrow, OT 06/14/2023, 4:42 PM

## 2023-06-14 NOTE — Telephone Encounter (Signed)
Per last office note on 7/17 patient was being advised on this medication by Endo

## 2023-06-15 ENCOUNTER — Ambulatory Visit: Payer: Medicare Other

## 2023-06-15 DIAGNOSIS — R5381 Other malaise: Secondary | ICD-10-CM | POA: Diagnosis not present

## 2023-06-15 DIAGNOSIS — R2681 Unsteadiness on feet: Secondary | ICD-10-CM

## 2023-06-15 DIAGNOSIS — R29898 Other symptoms and signs involving the musculoskeletal system: Secondary | ICD-10-CM | POA: Diagnosis not present

## 2023-06-15 DIAGNOSIS — M25632 Stiffness of left wrist, not elsewhere classified: Secondary | ICD-10-CM | POA: Diagnosis not present

## 2023-06-15 DIAGNOSIS — M6281 Muscle weakness (generalized): Secondary | ICD-10-CM

## 2023-06-15 DIAGNOSIS — R278 Other lack of coordination: Secondary | ICD-10-CM | POA: Diagnosis not present

## 2023-06-16 ENCOUNTER — Ambulatory Visit: Payer: Medicare Other | Admitting: Occupational Therapy

## 2023-06-16 DIAGNOSIS — R5381 Other malaise: Secondary | ICD-10-CM

## 2023-06-16 DIAGNOSIS — R29898 Other symptoms and signs involving the musculoskeletal system: Secondary | ICD-10-CM

## 2023-06-16 DIAGNOSIS — R278 Other lack of coordination: Secondary | ICD-10-CM

## 2023-06-16 DIAGNOSIS — R2681 Unsteadiness on feet: Secondary | ICD-10-CM | POA: Diagnosis not present

## 2023-06-16 DIAGNOSIS — M6281 Muscle weakness (generalized): Secondary | ICD-10-CM

## 2023-06-16 DIAGNOSIS — M25632 Stiffness of left wrist, not elsewhere classified: Secondary | ICD-10-CM

## 2023-06-16 NOTE — Therapy (Unsigned)
OUTPATIENT PHYSICAL THERAPY TREATMENT NOTE   Patient Name: [name] MRN: [id number] DOB:06/19/1951, 72 y.o., female Today's Date: 06/17/2023  PCP: Eden Emms, NP  REFERRING PROVIDER: Eden Emms, NP   END OF SESSION:   PT End of Session - 06/17/23 1441     Visit Number 16    Number of Visits 22    Date for PT Re-Evaluation 06/28/23    Authorization Type UHC MCR    PT Start Time 1445    PT Stop Time 1523    PT Time Calculation (min) 38 min    Activity Tolerance Patient limited by pain;Treatment limited secondary to medical complications (Comment)    Behavior During Therapy [facility] for tasks assessed/performed                Past Medical History:  Diagnosis Date   Acute urinary retention 05/04/2022   Allergy 2006 ?   Contrast dye   Arthritis 2016 ??   Knees and thumb   Cancer (HCC)    cecum   Cataract 2021   Surgery scheduled July 2023   Colon cancer [facility]) 2003   Elevated liver function tests    Esophageal varices (HCC)    Heart murmur On file   Hemorrhage of gastrointestinal tract 05/04/2011   Hypertension 2021   Hypothyroidism    Iron deficiency anemia    Liver disease    chemotherapy complication, per pt, shunts placed to bypass liver   Malignant neoplasm of cecum (HCC)    Portal hypertension (HCC)    Skin cancer 2019   Splenomegaly    Past Surgical History:  Procedure Laterality Date   COLON SURGERY  2004   Cancer   COSMETIC SURGERY  2021   Skin cancer   ESOPHAGEAL VARICE LIGATION     EYE SURGERY     HEMICOLECTOMY  01/08/2003   IR RADIOLOGIST EVAL & MGMT  12/20/2020   IR RADIOLOGIST EVAL & MGMT  05/29/2021   LIVER SURGERY     shunts placed after chemo complication   SKIN FULL THICKNESS GRAFT N/A 09/12/2019   Procedure: debridement and FTSG to the nose from left upper arm;  Surgeon: Allena Napoleon, MD;  Location: [facility];  Service: Plastics;  Laterality: N/A;  2 hours, please   TIPS PROCEDURE     Patient Active  Problem List   Diagnosis Date Noted   Acute back pain with sciatica, left 06/09/2023   Bradycardia 05/07/2023   Sepsis due to group B Streptococcus with acute renal failure and septic shock (HCC) 04/24/2023   Septic shock (HCC) 04/21/2023   Adrenal insufficiency (HCC) 03/19/2023   Protein-calorie malnutrition, severe (HCC) 02/26/2023   Acute hepatic encephalopathy (HCC) 02/25/2023   Weight loss 02/24/2023   Bowel habit changes 02/24/2023   Generalized abdominal pain 02/24/2023   Hospital discharge follow-up 02/24/2023   Weakness 01/04/2023   Frequent UTI 05/18/2022   Anemia 05/18/2022   AKI (acute kidney injury) (HCC) 04/30/2022   Tachycardia-bradycardia syndrome (HCC) 02/05/2022   Hepatic encephalopathy (HCC) 02/03/2022   Basal cell carcinoma (BCC) 05/06/2021   Cecal cancer (HCC) 05/06/2021   Hypertension 05/06/2021   COVID-19 vaccination declined 01/30/2021   Decompensated liver cirrhosis with portal HTN and gastric varices 12/04/2020   Essential hypertension 08/12/2020   Murmur, cardiac 03/19/2020   Hypothyroidism 06/30/2019   History of basal cell cancer 06/30/2019   Hx of colon cancer, stage III 11/30/2011   Esophageal varices (HCC) 06/12/2010   Portal hypertension (  HCC) 01/23/2008    REFERRING DIAG: R53.1 (ICD-10-CM) - Weakness   THERAPY DIAG:  Muscle weakness (generalized)  Physical deconditioning  Rationale for Evaluation and Treatment Rehabilitation  PERTINENT HISTORY:  See PMH  PRECAUTIONS: Fall  SUBJECTIVE:                                                                                                                                                                                      SUBJECTIVE STATEMENT: Still reporting R low back pain and has not had time to begin MD prescribed exercises.  Feels her mobility is increasing, walking faster, further and more frequently  PAIN:  Are you having pain? Yes (chronic LBP)   OBJECTIVE: (objective measures  completed at initial evaluation unless otherwise dated)   DIAGNOSTIC FINDINGS: none   PATIENT SURVEYS:  FOTO 61(68 predicted); 04/28/23 45% 05/20/23 49% 06/15/23 48%   MUSCLE LENGTH: Not tested   POSTURE: rounded shoulders, forward head, and increased thoracic kyphosis   PALPATION: Deferred based on Dx   LOWER EXTREMITY ROM: WFL for transfers and gait   Active ROM Right eval Left eval  Hip flexion      Hip extension      Hip abduction      Hip adduction      Hip internal rotation      Hip external rotation      Knee flexion      Knee extension      Ankle dorsiflexion      Ankle plantarflexion      Ankle inversion      Ankle eversion       (Blank rows = not tested)   LOWER EXTREMITY MMT:   MMT Right eval Left eval R 04/28/23 L 6//24  Hip flexion 4- 4- 3+ 3+  Hip extension 4- 4- 3+ 3+  Hip abduction 4- 4- 3+ 3+  Hip adduction        Hip internal rotation        Hip external rotation        Knee flexion 4- 4- 3+ 3+  Knee extension 4- 4- 3+ 3+  Ankle dorsiflexion        Ankle plantarflexion 4- 4- 3 3  Ankle inversion        Ankle eversion         (Blank rows = not tested)   LOWER EXTREMITY SPECIAL TESTS:  Deferred    FUNCTIONAL TESTS:  30 seconds chair stand test 7 stands with UE assist 04/28/23 4 stands with UE support 05/20/23 7 stands with UE Support   GAIT: Distance walked: 76ftx2 Assistive device utilized: Single point cane; 04/28/23 RW Level  of assistance: SBA Comments: slow cadence,  05/18/23 2 MWT 198ft w/cane  06/08/23 2 MWT 219ft with cane   Vital signs:  O2 sats 96% HR 58   TODAY'S TREATMENT:      [facility] Adult PT Treatment:                                                DATE: 06/17/23 Therapeutic Exercise: FAQs 3# with adduction 15x  FAQs 3# alternating 15/15 Marching 3# 15/15 against bolster Standing hamstring curls 3# Heel raises with UE support 15x DF with UE support 15x Seated hamstring stretch 30s x2 Standing gastroc stretch on slant  board 30x x2 Nustep L2    [facility] Adult PT Treatment:                                                DATE: 06/15/23  Therapeutic Activity:  Standing abduction 10/10 with foam roll support    06/15/23 0001  Berg Balance Test  Sit to Stand 3  Standing Unsupported 4  Sitting with Back Unsupported but Feet Supported on Floor or Stool 4  Stand to Sit 3  Transfers 3  Standing Unsupported with Eyes Closed 4  Standing Unsupported with Feet Together 4  From Standing, Reach Forward with Outstretched Arm 4  From Standing Position, Pick up Object from Floor 3  From Standing Position, Turn to Look Behind Over each Shoulder 4  Turn 360 Degrees 2  Standing Unsupported, Alternately Place Feet on Step/Stool 3  Standing Unsupported, One Foot in Front 3  Standing on One Leg 3  Total Score 47   16 Steps with cane using step to pattern   [facility] Adult PT Treatment:                                                DATE: 06/10/23 Therapeutic Exercise: Static stand on airex 30s hold wide/narrow BOS, EO,EC 4 bouts Stepping and weight shifting at counter 4 in step 10/10 no UE support  Side stepping at countertop 3 trips with fingertip support Tandem and retrowalking at counter 3 trips 2x5 STS from airex 5xSTS from Airex with OH reach   [facility] Adult PT Treatment:                                                DATE: 06/08/23 Therapeutic Exercise: Static stand on airex 30s hold wide/narrow BOS, EO,EC 4 bouts Stepping and weight shifting at counter 4 in step 10/10 no UE support  Side stepping at countertop 3 trips with fingertip support Tandem and retrowalking at counter 3 trips 5x STS from airex FAQs 3# with adduction 15x  FAQs 3# alternating 15/15  [facility] Adult PT Treatment:  DATE: 06/04/23 Therapeutic Exercise: Seated latissimus press 3s 10x Seated lat press with FAQs 10/10 Seated lat press with marching 10/10 Standing abduction with foam roll support 10/10  CGA Nustep L1 8 min 5x STS from airex STS 2x5      PATIENT EDUCATION:  Education details: Discussed eval findings, rehab rationale and POC and patient is in agreement  Person educated: Patient Education method: Explanation Education comprehension: verbalized understanding and needs further education   HOME EXERCISE PROGRAM: Access Code: [email or web address] URL: [email or web address] Date: 04/06/2023 Prepared by: Gustavus Bryant   Exercises - Sit to Stand with Armchair  - 2 x daily - 5 x weekly - 1 sets - 5 reps - Standing Heel Raise with Support  - 2 x daily - 5 x weekly - 1-2 sets - 10 reps   ASSESSMENT:   CLINICAL IMPRESSION: Today's session addressed LE weakness adding additional strengthening tasks.  Incorporated WS and proprioceptive stepping tasks weaning from UE support.    OBJECTIVE IMPAIRMENTS: Abnormal gait, decreased activity tolerance, decreased balance, decreased endurance, decreased mobility, difficulty walking, decreased strength, and postural dysfunction.    ACTIVITY LIMITATIONS: carrying, lifting, squatting, stairs, and locomotion level   PARTICIPATION LIMITATIONS: meal prep, cleaning, laundry, shopping, community activity, and yard work   PERSONAL FACTORS: Fitness, Past/current experiences, Time since onset of injury/illness/exacerbation, and 1 comorbidity: hepatic cirrhosis   are also affecting patient's functional outcome.    REHAB POTENTIAL: Good   CLINICAL DECISION MAKING: Stable/uncomplicated   EVALUATION COMPLEXITY: Low     GOALS: Goals reviewed with patient? No   SHORT TERM GOALS: Target date: 05/27/2023   Patient to demonstrate independence in HEP  Baseline: [id number] Goal status: MET   2.  275ft ambulation with SPC and S Baseline: 83ft with SPC and SBA; 64ft with RW and SBA; 06/08/23 27ft Goal status: Met       LONG TERM GOALS: Target date: 07/18/2023     Increase BLE strength to 4/5 Baseline:  MMT Right eval Left eval  R 04/28/23 L 6//24  Hip flexion 4- 4- 3+ 3+  Hip extension 4- 4- 3+ 3+  Hip abduction 4- 4- 3+ 3+  Hip adduction        Hip internal rotation        Hip external rotation        Knee flexion 4- 4- 3+ 3+  Knee extension 4- 4- 3+ 3+  Ankle dorsiflexion        Ankle plantarflexion 4- 4- 3 3    Goal status: INITIAL   2.  Increase BERG score to 50 Baseline: 44; 06/15/23 47 Goal status: Ongoing   3.  Increase 30s chair stand test to 10 reps w/UE support Baseline: 7 stands with UE support; 04/28/23 4 stands with UE support Goal status: Ongoing   4.  Patient to negotiate 16 steps with single rail, cane and appropriate step pattern Baseline: TBD; 06/15/23 16 steps with step to pattern single rail and cane Goal status: Ongoing  5.  Increase FOTO score to 68% Baseline: 45%; 06/15/23 48% Goal status: Ongoing         PLAN:   PT FREQUENCY: 1-2x/week   PT DURATION: 8 weeks   PLANNED INTERVENTIONS: Therapeutic exercises, Therapeutic activity, Neuromuscular re-education, Balance training, Gait training, Patient/Family education, Self Care, Joint mobilization, Stair training, DME instructions, Manual therapy, and Re-evaluation   PLAN FOR NEXT SESSION: HEP review and update, manual techniques as appropriate, aerobic tasks, ROM and flexibility activities, strengthening and PREs, TPDN, gait and  balance training as needed       Hildred Laser, PT 06/17/2023, 3:35 PM

## 2023-06-16 NOTE — Patient Instructions (Signed)
Upper Body Strengthening Exercises  Comments  Sit upright, away from the back of your chair. Keep abdominal muscles engaged i.e. pull belly button to spine.  FOR ALL EXERCISES: Repeat 10 Times  Hold 3 Seconds  Complete 1 Set  Perform 2 Times a Day   ELASTIC BAND FLEXION   Place unaffected arm on your leg or hip. With your affected arm, hold elastic band in front of you and pull the band upward towards the ceiling as shown.     ELASTIC BAND HORIZONTAL ABDUCTION - SCAPULAR RETRACTION  Start by holding elastic band in front of your chest with your elbows straight. Then, pull your arms apart and towards the side while squeezing your shoulder blades together. Return to starting position and repeat.            ELASTIC BAND TRICEPS EXTENSION  While seated, hold and fixate one end of an elastic band against your chest. Hold the other end with your opposite hand with your elbow bent and arm by your side.   Start by pulling the band downward so that the elbow goes from a bent position to a straightened position as shown. Return to starting position and repeat.   BICEPS CURL WITH BAND  While sitting in an upright position, put an elastic band underneath your feet (as pictured)  OR underneath your thighs. Grip each end of the band, keep the elbows tucked to the body, and bring hands to shoulders by bending at the elbows.

## 2023-06-16 NOTE — Therapy (Signed)
OUTPATIENT OCCUPATIONAL THERAPY ORTHO TREATMENT Patient Name: [name] MRN: [id number] DOB:Aug 08, 1951, 72 y.o., female Today's Date: 06/16/2023  PCP: Eden Emms, NP  REFERRING PROVIDER: Marlyne Beards, MD  END OF SESSION:  OT End of Session - 06/16/23 1457     Visit Number 11    Number of Visits 13    Date for OT Re-Evaluation 06/25/23    Authorization Type UHC Medicare    Progress Note Due on Visit 20    OT Start Time 1456    OT Stop Time 1534    OT Time Calculation (min) 38 min    Equipment Utilized During Treatment --    Activity Tolerance Patient tolerated treatment well;No increased pain    Behavior During Therapy [facility] for tasks assessed/performed             Past Medical History:  Diagnosis Date   Acute urinary retention 05/04/2022   Allergy 2006 ?   Contrast dye   Arthritis 2016 ??   Knees and thumb   Cancer (HCC)    cecum   Cataract 2021   Surgery scheduled July 2023   Colon cancer [facility]) 2003   Elevated liver function tests    Esophageal varices (HCC)    Heart murmur On file   Hemorrhage of gastrointestinal tract 05/04/2011   Hypertension 2021   Hypothyroidism    Iron deficiency anemia    Liver disease    chemotherapy complication, per pt, shunts placed to bypass liver   Malignant neoplasm of cecum (HCC)    Portal hypertension (HCC)    Skin cancer 2019   Splenomegaly    Past Surgical History:  Procedure Laterality Date   COLON SURGERY  2004   Cancer   COSMETIC SURGERY  2021   Skin cancer   ESOPHAGEAL VARICE LIGATION     EYE SURGERY     HEMICOLECTOMY  01/08/2003   IR RADIOLOGIST EVAL & MGMT  12/20/2020   IR RADIOLOGIST EVAL & MGMT  05/29/2021   LIVER SURGERY     shunts placed after chemo complication   SKIN FULL THICKNESS GRAFT N/A 09/12/2019   Procedure: debridement and FTSG to the nose from left upper arm;  Surgeon: Allena Napoleon, MD;  Location: [facility];  Service: Plastics;  Laterality: N/A;  2 hours,  please   TIPS PROCEDURE     Patient Active Problem List   Diagnosis Date Noted   Acute back pain with sciatica, left 06/09/2023   Bradycardia 05/07/2023   Sepsis due to group B Streptococcus with acute renal failure and septic shock (HCC) 04/24/2023   Septic shock (HCC) 04/21/2023   Adrenal insufficiency (HCC) 03/19/2023   Protein-calorie malnutrition, severe (HCC) 02/26/2023   Acute hepatic encephalopathy (HCC) 02/25/2023   Weight loss 02/24/2023   Bowel habit changes 02/24/2023   Generalized abdominal pain 02/24/2023   Hospital discharge follow-up 02/24/2023   Weakness 01/04/2023   Frequent UTI 05/18/2022   Anemia 05/18/2022   AKI (acute kidney injury) (HCC) 04/30/2022   Tachycardia-bradycardia syndrome (HCC) 02/05/2022   Hepatic encephalopathy (HCC) 02/03/2022   Basal cell carcinoma (BCC) 05/06/2021   Cecal cancer (HCC) 05/06/2021   Hypertension 05/06/2021   COVID-19 vaccination declined 01/30/2021   Decompensated liver cirrhosis with portal HTN and gastric varices 12/04/2020   Essential hypertension 08/12/2020   Murmur, cardiac 03/19/2020   Hypothyroidism 06/30/2019   History of basal cell cancer 06/30/2019   Hx of colon cancer, stage III 11/30/2011   Esophageal varices (HCC)  06/12/2010   Portal hypertension (HCC) 01/23/2008    ONSET DATE: 01/29/2023  REFERRING DIAG: fracture of distal end of radius left  THERAPY DIAG:  Muscle weakness (generalized)  Physical deconditioning  Stiffness of left wrist, not elsewhere classified  Other lack of coordination  Other symptoms and signs involving the musculoskeletal system  Rationale for Evaluation and Treatment: Rehabilitation  SUBJECTIVE:  SUBJECTIVE STATEMENT:  Her husband reports her L wrist and UEs in general are weak.   Pt accompanied by: significant other - [name]  PERTINENT HISTORY:  Closed, L distal radius fracture, managing conservatively due to health issues. She is not a candidate for surgery.  Follows  up with ortho 06/28/2023. Referred to [facility]OT for eval and treat.    PRECAUTIONS: Other: to tolerance  - wear brace with strenuous activity  WEIGHT BEARING RESTRICTIONS:  to tolerance LUE  PAIN:  Are you having pain? No  FALLS: Has patient fallen in last 6 months? Yes. 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 navy officer and son-in Social worker is Runner, broadcasting/film/video); was driving   PATIENT GOALS: return to [employer]   OBJECTIVE:   HAND DOMINANCE: Right  ADLs: Overall ADLs: mod I  MOBILITY STATUS: Needs Assist: use of cane  ACTIVITY TOLERANCE: Activity tolerance: fair  FUNCTIONAL OUTCOME MEASURES: Quick Dash: 29.5% disability  - See image/specific items on eval 05/12/23  UPPER EXTREMITY ROM:     AROM Right (eval) Left (eval) Left 7/3  Shoulder flexion WNL    Shoulder abduction WNL    Elbow flexion WNL    Elbow extension WNL    Wrist flexion WNL 43* 50*  Wrist extension WNL 39* 38*  Wrist pronation WNL    Wrist supination WNL     Digit Composite Flexion WNL Lacks 4 cm index; 5 cm middle; 4 cm ring; 3.5 cm pinky Lacks 2 cm index; 3 cm middle;  ring and pinky - full  Digit Composite Extension WNL    Digit Opposition WNL WFL WNL  (Blank rows = not tested)  RUE IR limited to just behind hip  UPPER EXTREMITY MMT:     BUE: WFL shoulders and elbows  HAND FUNCTION: Grip strength: Right: 24.4 lbs; Left: 7.2 lbs  COORDINATION: 9 Hole Peg test: Right: 28 sec; Left: 38 sec  SENSATION: WFL  EDEMA: none reported or observed  COGNITION: Overall cognitive status: History of cognitive impairments - at  baseline  OBSERVATIONS: Pt appears well-kept. Ambulates with cane. No LOB though slow, altered gait.   TODAY'S TREATMENT:                                                                                                                               -  Therapeutic activities completed for duration as noted below including: Pt participated in kitchen activities including retrieving items from cabinets and fridge, transporting items, and pouring items as needed to complete everyday kitchen tasks requiring cues for primary use of LUE. Pt completed laundry simulated tasks including retrieving items from washer, dryer, laundry basket, and shelf as well as transporting basket and hanging clothes. Cues to use LUE primarily.  - Therapeutic exercises completed for duration as noted below including: OT initiated red Thera-Band HEP including shoulder flexion, horizontal abd/add, bicep curl, and tricep extension to promote strengthening of affected extremity and overall endurance as noted in pt instructions.    PATIENT EDUCATION: Education details: Functional LUE use.  Red theraband HEP Person educated: Patient and Spouse Education method: Explanation, Demonstration, Tactile cues, Verbal cues, and Handouts Education comprehension: verbalized understanding, returned demonstration, verbal cues required, tactile cues required, and needs further education  HOME EXERCISE PROGRAM: 05/17/23 - Wrist ROM Access Code: [facility] 05/19/23 - Coordination Activities Handout with images 05/24/23 - Intro Putty Exercises Access Code: [email or web address] 06/02/2023 - red putty (updated) 06/16/2023: red theraband   GOALS:  SHORT TERM GOALS: Target date: 07/05/2023    Pt will demonstrate full composite flexion of L digits.  Baseline: Lacks 4 cm index; 5 cm middle; 4 cm ring; 3.5 cm Goal status: IN PROGRESS 06/09/23 Distance from palm still lacking for first 2 fingers: 2.5 cm index; 1.5 cm middle; ring and pinkie can touch palm 06/14/23  Distance from palm still lacking for first 2 fingers: 1.5 cm index; 0.5 cm middle; ring and pinkie can touch palm  2.  Pt will demonstrate L internal rotation to middle of spine to aid with ADL completion. Baseline: just behind hip Goal status: MET   LONG TERM GOALS: Target date: 07/26/2023    Patient will demonstrate LUE HEP with 25% verbal cues or less for proper execution. Baseline:  Goal status: IN PROGRESS  2.  Patient will demonstrate at least 16% improvement with quick Dash score (reporting 13.5% disability or less) indicating improved functional use of affected extremity (opening containers, washing back, carrying objects/bags). Baseline: 29.5% disability with use of LUE Goal status: IN PROGRESS  3.  Pt will demonstrate at least 120* combined L wrist ext/flex AROM.  Baseline: L wrist ext - 39*; flex - 43* Goal status: IN PROGRESS 06/09/23: Wrist ext - 45* flex - 50* 06/14/23: wrist ext - 50* flex - 50*  4.  Patient will demonstrate at least 22 lbs L grip strength as needed to open jars and other containers. Baseline: 7.2 lbs 06/02/2023: 19.8lbs Goal status: REVISED 06/14/23 06/09/23 - still can't open a child proof medicine bottle  5.  Patient will complete nine-hole peg with use of L in 35 seconds or less. Baseline: 38 seconds Goal status: REVISED 06/14/23 06/09/23: 36:35 seconds    ASSESSMENT:  CLINICAL IMPRESSION: Pt demonstrates ongoing limitations with L wrist extension and full functional grip/tight finger flexion position which affects her maximum strength and coordination still.  She has been going without her brace more often and her husband reports observing increased ease with tasks such as emptying pot of water etc.   This 10th progress note is for dates: 05/12/23 to 06/16/2023. Pt has met 1/2 STGs and 1/5 LTGs but is making progress in all areas and is expected to continue to progress therefore conitnued skilled OT services are recommended in the outpatient setting  to work towards remaining goals or until max rehab potential in UE impairments in ROM, strength  and coordination is met to help pt return to PLOF as able.   PERFORMANCE DEFICITS: in functional skills including ADLs, IADLs, coordination, ROM, strength, pain, Fine motor control, and UE functional use, cognitive skills including memory and thought  IMPAIRMENTS: are limiting patient from ADLs, IADLs, and work.   CO-MORBIDITIES: may have co-morbidities  that affects occupational performance. Patient will benefit from skilled OT to address above impairments and improve overall function.  REHAB POTENTIAL: Good   PLAN:  OT FREQUENCY: 1-2x/week  OT DURATION: additional 6 weeks  PLANNED INTERVENTIONS: self care/ADL training, therapeutic exercise, therapeutic activity, manual therapy, passive range of motion, splinting, ultrasound, paraffin, fluidotherapy, moist heat, contrast bath, patient/family education, DME and/or AE instructions, and Re-evaluation  RECOMMENDED OTHER SERVICES: none at this time  CONSULTED AND AGREED WITH PLAN OF CARE: Patient and family member/caregiver  PLAN FOR NEXT SESSION:  Review theraband HEP  Continue functional use of LUE in kitchen/laundry area ie) moving cans etc  Upgrade ROM and strengthening HEP   Modalities paraffin vs fluidotherapy ONLY if skin on L forearm intact/US) for progressing P/AROM and stretching wrist .   Delana Meyer, OT 06/16/2023, 6:02 PM

## 2023-06-17 ENCOUNTER — Ambulatory Visit: Payer: Medicare Other

## 2023-06-17 DIAGNOSIS — R29898 Other symptoms and signs involving the musculoskeletal system: Secondary | ICD-10-CM | POA: Diagnosis not present

## 2023-06-17 DIAGNOSIS — R5381 Other malaise: Secondary | ICD-10-CM

## 2023-06-17 DIAGNOSIS — R2681 Unsteadiness on feet: Secondary | ICD-10-CM | POA: Diagnosis not present

## 2023-06-17 DIAGNOSIS — R278 Other lack of coordination: Secondary | ICD-10-CM | POA: Diagnosis not present

## 2023-06-17 DIAGNOSIS — M6281 Muscle weakness (generalized): Secondary | ICD-10-CM | POA: Diagnosis not present

## 2023-06-17 DIAGNOSIS — M25632 Stiffness of left wrist, not elsewhere classified: Secondary | ICD-10-CM | POA: Diagnosis not present

## 2023-06-21 ENCOUNTER — Ambulatory Visit: Payer: Medicare Other | Admitting: Occupational Therapy

## 2023-06-21 ENCOUNTER — Encounter: Payer: Medicare Other | Admitting: Nurse Practitioner

## 2023-06-21 DIAGNOSIS — R278 Other lack of coordination: Secondary | ICD-10-CM | POA: Diagnosis not present

## 2023-06-21 DIAGNOSIS — R2681 Unsteadiness on feet: Secondary | ICD-10-CM | POA: Diagnosis not present

## 2023-06-21 DIAGNOSIS — M6281 Muscle weakness (generalized): Secondary | ICD-10-CM

## 2023-06-21 DIAGNOSIS — R5381 Other malaise: Secondary | ICD-10-CM | POA: Diagnosis not present

## 2023-06-21 DIAGNOSIS — M25632 Stiffness of left wrist, not elsewhere classified: Secondary | ICD-10-CM | POA: Diagnosis not present

## 2023-06-21 DIAGNOSIS — R29898 Other symptoms and signs involving the musculoskeletal system: Secondary | ICD-10-CM | POA: Diagnosis not present

## 2023-06-21 NOTE — Therapy (Unsigned)
OUTPATIENT PHYSICAL THERAPY TREATMENT NOTE   Patient Name: [name] MRN: [id number] DOB:15-Jun-1951, 73 y.o., female Today's Date: 06/22/2023  PCP: Eden Emms, NP  REFERRING PROVIDER: Eden Emms, NP   END OF SESSION:   PT End of Session - 06/22/23 1448     Visit Number 17    Number of Visits 22    Date for PT Re-Evaluation 06/28/23    Authorization Type UHC MCR    PT Start Time 1445    PT Stop Time 1525    PT Time Calculation (min) 40 min    Activity Tolerance Patient limited by pain;Treatment limited secondary to medical complications (Comment)    Behavior During Therapy [facility] for tasks assessed/performed                 Past Medical History:  Diagnosis Date   Acute urinary retention 05/04/2022   Allergy 2006 ?   Contrast dye   Arthritis 2016 ??   Knees and thumb   Cancer (HCC)    cecum   Cataract 2021   Surgery scheduled July 2023   Colon cancer [facility]) 2003   Elevated liver function tests    Esophageal varices (HCC)    Heart murmur On file   Hemorrhage of gastrointestinal tract 05/04/2011   Hypertension 2021   Hypothyroidism    Iron deficiency anemia    Liver disease    chemotherapy complication, per pt, shunts placed to bypass liver   Malignant neoplasm of cecum (HCC)    Portal hypertension (HCC)    Skin cancer 2019   Splenomegaly    Past Surgical History:  Procedure Laterality Date   COLON SURGERY  2004   Cancer   COSMETIC SURGERY  2021   Skin cancer   ESOPHAGEAL VARICE LIGATION     EYE SURGERY     HEMICOLECTOMY  01/08/2003   IR RADIOLOGIST EVAL & MGMT  12/20/2020   IR RADIOLOGIST EVAL & MGMT  05/29/2021   LIVER SURGERY     shunts placed after chemo complication   SKIN FULL THICKNESS GRAFT N/A 09/12/2019   Procedure: debridement and FTSG to the nose from left upper arm;  Surgeon: Allena Napoleon, MD;  Location: [facility];  Service: Plastics;  Laterality: N/A;  2 hours, please   TIPS PROCEDURE     Patient Active  Problem List   Diagnosis Date Noted   Acute back pain with sciatica, left 06/09/2023   Bradycardia 05/07/2023   Sepsis due to group B Streptococcus with acute renal failure and septic shock (HCC) 04/24/2023   Septic shock (HCC) 04/21/2023   Adrenal insufficiency (HCC) 03/19/2023   Protein-calorie malnutrition, severe (HCC) 02/26/2023   Acute hepatic encephalopathy (HCC) 02/25/2023   Weight loss 02/24/2023   Bowel habit changes 02/24/2023   Generalized abdominal pain 02/24/2023   Hospital discharge follow-up 02/24/2023   Weakness 01/04/2023   Frequent UTI 05/18/2022   Anemia 05/18/2022   AKI (acute kidney injury) (HCC) 04/30/2022   Tachycardia-bradycardia syndrome (HCC) 02/05/2022   Hepatic encephalopathy (HCC) 02/03/2022   Basal cell carcinoma (BCC) 05/06/2021   Cecal cancer (HCC) 05/06/2021   Hypertension 05/06/2021   COVID-19 vaccination declined 01/30/2021   Decompensated liver cirrhosis with portal HTN and gastric varices 12/04/2020   Essential hypertension 08/12/2020   Murmur, cardiac 03/19/2020   Hypothyroidism 06/30/2019   History of basal cell cancer 06/30/2019   Hx of colon cancer, stage III 11/30/2011   Esophageal varices (HCC) 06/12/2010   Portal  hypertension (HCC) 01/23/2008    REFERRING DIAG: R53.1 (ICD-10-CM) - Weakness   THERAPY DIAG:  Muscle weakness (generalized)  Physical deconditioning  Unsteadiness on feet  Rationale for Evaluation and Treatment Rehabilitation  PERTINENT HISTORY:  See PMH  PRECAUTIONS: Fall  SUBJECTIVE:                                                                                                                                                                                      SUBJECTIVE STATEMENT: Still reporting R low back pain and has not had time to begin MD prescribed exercises.  Feels her mobility is increasing, walking faster, further and more frequently  PAIN:  Are you having pain? Yes (chronic LBP)   OBJECTIVE:  (objective measures completed at initial evaluation unless otherwise dated)   DIAGNOSTIC FINDINGS: none   PATIENT SURVEYS:  FOTO 61(68 predicted); 04/28/23 45% 05/20/23 49% 06/15/23 48%   MUSCLE LENGTH: Not tested   POSTURE: rounded shoulders, forward head, and increased thoracic kyphosis   PALPATION: Deferred based on Dx   LOWER EXTREMITY ROM: WFL for transfers and gait   Active ROM Right eval Left eval  Hip flexion      Hip extension      Hip abduction      Hip adduction      Hip internal rotation      Hip external rotation      Knee flexion      Knee extension      Ankle dorsiflexion      Ankle plantarflexion      Ankle inversion      Ankle eversion       (Blank rows = not tested)   LOWER EXTREMITY MMT:   MMT Right eval Left eval R 04/28/23 L 6//24  Hip flexion 4- 4- 3+ 3+  Hip extension 4- 4- 3+ 3+  Hip abduction 4- 4- 3+ 3+  Hip adduction        Hip internal rotation        Hip external rotation        Knee flexion 4- 4- 3+ 3+  Knee extension 4- 4- 3+ 3+  Ankle dorsiflexion        Ankle plantarflexion 4- 4- 3 3  Ankle inversion        Ankle eversion         (Blank rows = not tested)   LOWER EXTREMITY SPECIAL TESTS:  Deferred    FUNCTIONAL TESTS:  30 seconds chair stand test 7 stands with UE assist 04/28/23 4 stands with UE support 05/20/23 7 stands with UE Support   GAIT: Distance walked: 86ftx2 Assistive device utilized: Single  point cane; 04/28/23 RW Level of assistance: SBA Comments: slow cadence,  05/18/23 2 MWT 136ft w/cane  06/08/23 2 MWT 28ft with cane   Vital signs:  O2 sats 96% HR 58   TODAY'S TREATMENT:      [facility] Adult PT Treatment:                                                DATE: 06/22/23 Therapeutic Exercise: STS w/o UE support 6 reps focus on form  384ft ambulation with cane  WS onto 6 in block A/P and M/L 5 x each leg  Neuromuscular re-ed: Standing cone taps unilateral 5x w/without UE support Standing 2 cone taps from  airex single taps progressing to alternating taps limiting UE support    [facility] Adult PT Treatment:                                                DATE: 06/17/23 Therapeutic Exercise: FAQs 3# with adduction 15x  FAQs 3# alternating 15/15 Marching 3# 15/15 against bolster Standing hamstring curls 3# Heel raises with UE support 15x DF with UE support 15x Seated hamstring stretch 30s x2 Standing gastroc stretch on slant board 30x x2 Nustep L2    [facility] Adult PT Treatment:                                                DATE: 06/15/23  Therapeutic Activity:  Standing abduction 10/10 with foam roll support    06/15/23 0001  Berg Balance Test  Sit to Stand 3  Standing Unsupported 4  Sitting with Back Unsupported but Feet Supported on Floor or Stool 4  Stand to Sit 3  Transfers 3  Standing Unsupported with Eyes Closed 4  Standing Unsupported with Feet Together 4  From Standing, Reach Forward with Outstretched Arm 4  From Standing Position, Pick up Object from Floor 3  From Standing Position, Turn to Look Behind Over each Shoulder 4  Turn 360 Degrees 2  Standing Unsupported, Alternately Place Feet on Step/Stool 3  Standing Unsupported, One Foot in Front 3  Standing on One Leg 3  Total Score 47   16 Steps with cane using step to pattern   [facility] Adult PT Treatment:                                                DATE: 06/10/23 Therapeutic Exercise: Static stand on airex 30s hold wide/narrow BOS, EO,EC 4 bouts Stepping and weight shifting at counter 4 in step 10/10 no UE support  Side stepping at countertop 3 trips with fingertip support Tandem and retrowalking at counter 3 trips 2x5 STS from airex 5xSTS from Airex with OH reach   [facility] Adult PT Treatment:  DATE: 06/08/23 Therapeutic Exercise: Static stand on airex 30s hold wide/narrow BOS, EO,EC 4 bouts Stepping and weight shifting at counter 4 in step 10/10 no UE support  Side stepping  at countertop 3 trips with fingertip support Tandem and retrowalking at counter 3 trips 5x STS from airex FAQs 3# with adduction 15x  FAQs 3# alternating 15/15  [facility] Adult PT Treatment:                                                DATE: 06/04/23 Therapeutic Exercise: Seated latissimus press 3s 10x Seated lat press with FAQs 10/10 Seated lat press with marching 10/10 Standing abduction with foam roll support 10/10 CGA Nustep L1 8 min 5x STS from airex STS 2x5      PATIENT EDUCATION:  Education details: Discussed eval findings, rehab rationale and POC and patient is in agreement  Person educated: Patient Education method: Explanation Education comprehension: verbalized understanding and needs further education   HOME EXERCISE PROGRAM: Access Code: [email or web address] URL: [email or web address] Date: 04/06/2023 Prepared by: Gustavus Bryant   Exercises - Sit to Stand with Armchair  - 2 x daily - 5 x weekly - 1 sets - 5 reps - Standing Heel Raise with Support  - 2 x daily - 5 x weekly - 1-2 sets - 10 reps   ASSESSMENT:   CLINICAL IMPRESSION: Today's focus was long distance ambulation and balance/proprioceptive training.  Focus on SLS tasks and proper and accurate foot placement.  Most balance issues arise from apprehension vs musculoskeletal deficits.  OBJECTIVE IMPAIRMENTS: Abnormal gait, decreased activity tolerance, decreased balance, decreased endurance, decreased mobility, difficulty walking, decreased strength, and postural dysfunction.    ACTIVITY LIMITATIONS: carrying, lifting, squatting, stairs, and locomotion level   PARTICIPATION LIMITATIONS: meal prep, cleaning, laundry, shopping, community activity, and yard work   PERSONAL FACTORS: Fitness, Past/current experiences, Time since onset of injury/illness/exacerbation, and 1 comorbidity: hepatic cirrhosis   are also affecting patient's functional outcome.    REHAB POTENTIAL: Good   CLINICAL DECISION MAKING:  Stable/uncomplicated   EVALUATION COMPLEXITY: Low     GOALS: Goals reviewed with patient? No   SHORT TERM GOALS: Target date: 05/27/2023   Patient to demonstrate independence in HEP  Baseline: [id number] Goal status: MET   2.  246ft ambulation with SPC and S Baseline: 75ft with SPC and SBA; 53ft with RW and SBA; 06/08/23 219ft Goal status: Met       LONG TERM GOALS: Target date: 07/18/2023     Increase BLE strength to 4/5 Baseline:  MMT Right eval Left eval R 04/28/23 L 6//24  Hip flexion 4- 4- 3+ 3+  Hip extension 4- 4- 3+ 3+  Hip abduction 4- 4- 3+ 3+  Hip adduction        Hip internal rotation        Hip external rotation        Knee flexion 4- 4- 3+ 3+  Knee extension 4- 4- 3+ 3+  Ankle dorsiflexion        Ankle plantarflexion 4- 4- 3 3    Goal status: INITIAL   2.  Increase BERG score to 50 Baseline: 44; 06/15/23 47 Goal status: Ongoing   3.  Increase 30s chair stand test to 10 reps w/UE support Baseline: 7 stands with UE support; 04/28/23 4 stands with UE support Goal status: Ongoing   4.  Patient to negotiate 16 steps with single rail, cane and appropriate step pattern Baseline: TBD; 06/15/23 16 steps with step to pattern single rail and cane Goal status: Ongoing  5.  Increase FOTO score to 68% Baseline: 45%; 06/15/23 48% Goal status: Ongoing         PLAN:   PT FREQUENCY: 1-2x/week   PT DURATION: 8 weeks   PLANNED INTERVENTIONS: Therapeutic exercises, Therapeutic activity, Neuromuscular re-education, Balance training, Gait training, Patient/Family education, Self Care, Joint mobilization, Stair training, DME instructions, Manual therapy, and Re-evaluation   PLAN FOR NEXT SESSION: HEP review and update, manual techniques as appropriate, aerobic tasks, ROM and flexibility activities, strengthening and PREs, TPDN, gait and balance training as needed       Hildred Laser, PT 06/22/2023, 3:50 PM

## 2023-06-21 NOTE — Therapy (Unsigned)
OUTPATIENT OCCUPATIONAL THERAPY ORTHO TREATMENT Patient Name: [name] MRN: [id number] DOB:October 28, 1951, 72 y.o., female Today's Date: 06/21/2023  PCP: Katherine Emms, NP  REFERRING PROVIDER: Marlyne Beards, MD  END OF SESSION:  OT End of Session - 06/21/23 1537     Visit Number 12    Number of Visits 22    Date for OT Re-Evaluation 07/30/23    Authorization Type UHC Medicare    Progress Note Due on Visit 20    OT Start Time 1535    OT Stop Time 1620    OT Time Calculation (min) 45 min    Activity Tolerance Patient tolerated treatment well;No increased pain    Behavior During Therapy [facility] for tasks assessed/performed             Past Medical History:  Diagnosis Date   Acute urinary retention 05/04/2022   Allergy 2006 ?   Contrast dye   Arthritis 2016 ??   Knees and thumb   Cancer (HCC)    cecum   Cataract 2021   Surgery scheduled July 2023   Colon cancer [facility]) 2003   Elevated liver function tests    Esophageal varices (HCC)    Heart murmur On file   Hemorrhage of gastrointestinal tract 05/04/2011   Hypertension 2021   Hypothyroidism    Iron deficiency anemia    Liver disease    chemotherapy complication, per pt, shunts placed to bypass liver   Malignant neoplasm of cecum (HCC)    Portal hypertension (HCC)    Skin cancer 2019   Splenomegaly    Past Surgical History:  Procedure Laterality Date   COLON SURGERY  2004   Cancer   COSMETIC SURGERY  2021   Skin cancer   ESOPHAGEAL VARICE LIGATION     EYE SURGERY     HEMICOLECTOMY  01/08/2003   IR RADIOLOGIST EVAL & MGMT  12/20/2020   IR RADIOLOGIST EVAL & MGMT  05/29/2021   LIVER SURGERY     shunts placed after chemo complication   SKIN FULL THICKNESS GRAFT N/A 09/12/2019   Procedure: debridement and FTSG to the nose from left upper arm;  Surgeon: Allena Napoleon, MD;  Location: [facility];  Service: Plastics;  Laterality: N/A;  2 hours, please   TIPS PROCEDURE     Patient Active  Problem List   Diagnosis Date Noted   Acute back pain with sciatica, left 06/09/2023   Bradycardia 05/07/2023   Sepsis due to group B Streptococcus with acute renal failure and septic shock (HCC) 04/24/2023   Septic shock (HCC) 04/21/2023   Adrenal insufficiency (HCC) 03/19/2023   Protein-calorie malnutrition, severe (HCC) 02/26/2023   Acute hepatic encephalopathy (HCC) 02/25/2023   Weight loss 02/24/2023   Bowel habit changes 02/24/2023   Generalized abdominal pain 02/24/2023   Hospital discharge follow-up 02/24/2023   Weakness 01/04/2023   Frequent UTI 05/18/2022   Anemia 05/18/2022   AKI (acute kidney injury) (HCC) 04/30/2022   Tachycardia-bradycardia syndrome (HCC) 02/05/2022   Hepatic encephalopathy (HCC) 02/03/2022   Basal cell carcinoma (BCC) 05/06/2021   Cecal cancer (HCC) 05/06/2021   Hypertension 05/06/2021   COVID-19 vaccination declined 01/30/2021   Decompensated liver cirrhosis with portal HTN and gastric varices 12/04/2020   Essential hypertension 08/12/2020   Murmur, cardiac 03/19/2020   Hypothyroidism 06/30/2019   History of basal cell cancer 06/30/2019   Hx of colon cancer, stage III 11/30/2011   Esophageal varices (HCC) 06/12/2010   Portal hypertension (HCC) 01/23/2008  ONSET DATE: 01/29/2023  REFERRING DIAG: fracture of distal end of radius left  THERAPY DIAG:  Stiffness of left wrist, not elsewhere classified  Other lack of coordination  Muscle weakness (generalized)  Rationale for Evaluation and Treatment: Rehabilitation  SUBJECTIVE:  SUBJECTIVE STATEMENT:  Her husband reports the doctor will be pleased with [name] progress as her wrist ROM has continued to improve.  Pt accompanied by: significant other - [name]  PERTINENT HISTORY:  Closed, L distal radius fracture, managing conservatively due to health issues. She is not a candidate for surgery.  Follows up with ortho 06/28/2023. Referred to OPOT for eval and treat.    PRECAUTIONS: Other:  to tolerance  - wear brace with strenuous activity  WEIGHT BEARING RESTRICTIONS:  to tolerance LUE  PAIN:  Are you having pain? No - not in hand but her knee was bothering her today d/t rainy weather  FALLS: Has patient fallen in last 6 months? Yes. 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 navy officer and son-in Social worker is Runner, broadcasting/film/video); was driving   PATIENT GOALS: return to [employer]   OBJECTIVE:   HAND DOMINANCE: Right  ADLs: Overall ADLs: mod I  MOBILITY STATUS: Needs Assist: use of cane  ACTIVITY TOLERANCE: Activity tolerance: fair  FUNCTIONAL OUTCOME MEASURES: Quick Dash: 29.5% disability  - See image/specific items on eval 05/12/23  UPPER EXTREMITY ROM:     AROM Right (eval) Left (eval) Left 7/3  Shoulder flexion WNL    Shoulder abduction WNL    Elbow flexion WNL    Elbow extension WNL    Wrist flexion WNL 43* 50*  Wrist extension WNL 39* 38*  Wrist pronation WNL    Wrist supination WNL     Digit Composite Flexion WNL Lacks 4 cm index; 5 cm middle; 4 cm ring; 3.5 cm pinky Lacks 2 cm index; 3 cm middle;  ring and pinky - full  Digit Composite Extension WNL    Digit Opposition WNL WFL WNL  (Blank rows = not tested)  RUE IR limited to just behind hip  UPPER EXTREMITY MMT:     BUE: WFL shoulders and elbows  HAND FUNCTION: Grip strength: Right: 24.4 lbs; Left: 7.2 lbs  COORDINATION: 9 Hole Peg test: Right: 28 sec; Left: 38 sec  SENSATION: WFL  EDEMA: none reported or observed  COGNITION: Overall cognitive status: History of cognitive impairments - at baseline  OBSERVATIONS:  Pt appears well-kept. Ambulates with cane. No LOB though slow, altered gait.   TODAY'S TREATMENT:                                                                                                                               -  Therapeutic exercises completed for duration as noted below including:   Pt placed LUE in Fluidotherapy machine with supervised ROM x 12 min. Pt was educated to complete *** PROM during modality time to improve ROM and decrease pain/stiffness of affected extremity by use of the machine's massaging action and thermal properties.   OT initiated red Thera-Band HEP including shoulder flexion, horizontal abd/add, bicep curl, and tricep extension to promote strengthening of affected extremity and overall endurance as noted in pt instructions.    PATIENT EDUCATION: Education details: Functional LUE use.  Red theraband HEP Person educated: Patient and Spouse Education method: Explanation, Demonstration, Tactile cues, Verbal cues, and Handouts Education comprehension: verbalized understanding, returned demonstration, verbal cues required, tactile cues required, and needs further education  HOME EXERCISE PROGRAM: 05/17/23 - Wrist ROM Access Code: [facility] 05/19/23 - Coordination Activities Handout with images 05/24/23 - Intro Putty Exercises Access Code: [email or web address] 06/02/2023 - red putty (updated) 06/16/2023: red theraband   GOALS:  SHORT TERM GOALS: Target date: 07/05/2023    Pt will demonstrate full composite flexion of L digits.  Baseline: Lacks 4 cm index; 5 cm middle; 4 cm ring; 3.5 cm Goal status: IN PROGRESS 06/09/23 Distance from palm still lacking for first 2 fingers: 2.5 cm index; 1.5 cm middle; ring and pinkie can touch palm 06/14/23 Distance from palm still lacking for first 2 fingers: 1.5 cm index; 0.5 cm middle; ring and pinkie can touch palm  2.  Pt will demonstrate L internal rotation to middle of spine to aid with ADL completion. Baseline: just behind hip Goal  status: MET   LONG TERM GOALS: Target date: 07/26/2023    Patient will demonstrate LUE HEP with 25% verbal cues or less for proper execution. Baseline:  Goal status: IN PROGRESS  2.  Patient will demonstrate at least 16% improvement with quick Dash score (reporting 13.5% disability or less) indicating improved functional use of affected extremity (opening containers, washing back, carrying objects/bags). Baseline: 29.5% disability with use of LUE Goal status: IN PROGRESS  3.  Pt will demonstrate at least 120* combined L wrist ext/flex AROM.  Baseline: L wrist ext - 39*; flex - 43* Goal status: IN PROGRESS 06/09/23: Wrist ext - 45* flex - 50* 06/14/23: wrist ext - 50* flex - 50*  4.  Patient will demonstrate at least 22 lbs L grip strength as needed to open jars and other containers. Baseline: 7.2 lbs 06/02/2023: 19.8lbs Goal status: REVISED 06/14/23 06/09/23 - still can't open a child proof medicine bottle  5.  Patient will complete nine-hole peg with use of L in 35 seconds or less. Baseline: 38 seconds Goal status: REVISED 06/14/23 06/09/23: 36:35 seconds    ASSESSMENT:  CLINICAL IMPRESSION: Pt demonstrates ongoing limitations with L wrist extension and full functional grip/tight finger flexion position which affects her maximum strength and coordination still.  She has been going without her brace more often and her husband reports observing increased ease with tasks such as emptying pot of water etc.   This 10th progress note is for dates: 05/12/23 to 06/21/2023. Pt has met 1/2 STGs and 1/5 LTGs but is making progress in all areas and is expected to continue to progress therefore conitnued skilled OT services are recommended in the outpatient setting to work towards remaining goals or until max rehab potential in UE impairments in ROM, strength and coordination is met to help pt return to PLOF as able.   PERFORMANCE DEFICITS: in functional skills including ADLs, IADLs, coordination, ROM,  strength,  pain, Fine motor control, and UE functional use, cognitive skills including memory and thought  IMPAIRMENTS: are limiting patient from ADLs, IADLs, and work.   CO-MORBIDITIES: may have co-morbidities  that affects occupational performance. Patient will benefit from skilled OT to address above impairments and improve overall function.  REHAB POTENTIAL: Good   PLAN:  OT FREQUENCY: 1-2x/week  OT DURATION: additional 6 weeks  PLANNED INTERVENTIONS: self care/ADL training, therapeutic exercise, therapeutic activity, manual therapy, passive range of motion, splinting, ultrasound, paraffin, fluidotherapy, moist heat, contrast bath, patient/family education, DME and/or AE instructions, and Re-evaluation  RECOMMENDED OTHER SERVICES: none at this time  CONSULTED AND AGREED WITH PLAN OF CARE: Patient and family member/caregiver  PLAN FOR NEXT SESSION:  Review theraband HEP  Continue functional use of LUE in kitchen/laundry area ie) moving cans etc  Upgrade ROM and strengthening HEP   Modalities paraffin vs fluidotherapy ONLY if skin on L forearm intact/US) for progressing P/AROM and stretching wrist .   Victorino Sparrow, OT 06/21/2023, 4:37 PM

## 2023-06-22 ENCOUNTER — Ambulatory Visit: Payer: Medicare Other

## 2023-06-22 DIAGNOSIS — R2681 Unsteadiness on feet: Secondary | ICD-10-CM | POA: Diagnosis not present

## 2023-06-22 DIAGNOSIS — R5381 Other malaise: Secondary | ICD-10-CM | POA: Diagnosis not present

## 2023-06-22 DIAGNOSIS — M6281 Muscle weakness (generalized): Secondary | ICD-10-CM | POA: Diagnosis not present

## 2023-06-22 DIAGNOSIS — R29898 Other symptoms and signs involving the musculoskeletal system: Secondary | ICD-10-CM | POA: Diagnosis not present

## 2023-06-22 DIAGNOSIS — M25632 Stiffness of left wrist, not elsewhere classified: Secondary | ICD-10-CM | POA: Diagnosis not present

## 2023-06-22 DIAGNOSIS — R278 Other lack of coordination: Secondary | ICD-10-CM | POA: Diagnosis not present

## 2023-06-22 NOTE — Therapy (Signed)
[facility] Outpatient Orthopedic Rehabilitation at [facility] [address] [place], [place], [address] Phone: [phone]   Fax:  [phone]  Patient Details  Name: [name] MRN: [id number] Date of Birth: 03-19-1951 Referring Provider:  Eden Emms, NP  Encounter Date: 06/24/2023   Hildred Laser, PT 06/22/2023, 4:40 PM  [facility] Outpatient Orthopedic Rehabilitation at [facility] [address] [place], [place], [address] Phone: [phone]   Fax:  [phone]

## 2023-06-23 ENCOUNTER — Ambulatory Visit: Payer: Medicare Other | Admitting: Occupational Therapy

## 2023-06-23 DIAGNOSIS — M6281 Muscle weakness (generalized): Secondary | ICD-10-CM

## 2023-06-23 DIAGNOSIS — R5381 Other malaise: Secondary | ICD-10-CM | POA: Diagnosis not present

## 2023-06-23 DIAGNOSIS — M25632 Stiffness of left wrist, not elsewhere classified: Secondary | ICD-10-CM | POA: Diagnosis not present

## 2023-06-23 DIAGNOSIS — R29898 Other symptoms and signs involving the musculoskeletal system: Secondary | ICD-10-CM | POA: Diagnosis not present

## 2023-06-23 DIAGNOSIS — R2681 Unsteadiness on feet: Secondary | ICD-10-CM | POA: Diagnosis not present

## 2023-06-23 DIAGNOSIS — R278 Other lack of coordination: Secondary | ICD-10-CM | POA: Diagnosis not present

## 2023-06-23 NOTE — Therapy (Signed)
OUTPATIENT OCCUPATIONAL THERAPY ORTHO TREATMENT Patient Name: [name] MRN: [id number] DOB:[birth year], 72 y.o., female Today's Date: 06/23/2023  PCP: Eden Emms, NP  REFERRING PROVIDER: Marlyne Beards, MD  END OF SESSION:  OT End of Session - 06/23/23 1453     Visit Number 13    Number of Visits 22    Date for OT Re-Evaluation 07/30/23    Authorization Type UHC Medicare    Progress Note Due on Visit 20    OT Start Time 1452    OT Stop Time 1530    OT Time Calculation (min) 38 min    Activity Tolerance Patient tolerated treatment well;No increased pain    Behavior During Therapy [facility] for tasks assessed/performed             Past Medical History:  Diagnosis Date   Acute urinary retention 05/04/2022   Allergy 2006 ?   Contrast dye   Arthritis 2016 ??   Knees and thumb   Cancer (HCC)    cecum   Cataract 2021   Surgery scheduled July 2023   Colon cancer [facility]) 2003   Elevated liver function tests    Esophageal varices (HCC)    Heart murmur On file   Hemorrhage of gastrointestinal tract 05/04/2011   Hypertension 2021   Hypothyroidism    Iron deficiency anemia    Liver disease    chemotherapy complication, per pt, shunts placed to bypass liver   Malignant neoplasm of cecum (HCC)    Portal hypertension (HCC)    Skin cancer 2019   Splenomegaly    Past Surgical History:  Procedure Laterality Date   COLON SURGERY  2004   Cancer   COSMETIC SURGERY  2021   Skin cancer   ESOPHAGEAL VARICE LIGATION     EYE SURGERY     HEMICOLECTOMY  01/08/2003   IR RADIOLOGIST EVAL & MGMT  12/20/2020   IR RADIOLOGIST EVAL & MGMT  05/29/2021   LIVER SURGERY     shunts placed after chemo complication   SKIN FULL THICKNESS GRAFT N/A 09/12/2019   Procedure: debridement and FTSG to the nose from left upper arm;  Surgeon: Allena Napoleon, MD;  Location: [facility];  Service: Plastics;  Laterality: N/A;  2 hours, please   TIPS PROCEDURE     Patient Active  Problem List   Diagnosis Date Noted   Acute back pain with sciatica, left 06/09/2023   Bradycardia 05/07/2023   Sepsis due to group B Streptococcus with acute renal failure and septic shock (HCC) 04/24/2023   Septic shock (HCC) 04/21/2023   Adrenal insufficiency (HCC) 03/19/2023   Protein-calorie malnutrition, severe (HCC) 02/26/2023   Acute hepatic encephalopathy (HCC) 02/25/2023   Weight loss 02/24/2023   Bowel habit changes 02/24/2023   Generalized abdominal pain 02/24/2023   Hospital discharge follow-up 02/24/2023   Weakness 01/04/2023   Frequent UTI 05/18/2022   Anemia 05/18/2022   AKI (acute kidney injury) (HCC) 04/30/2022   Tachycardia-bradycardia syndrome (HCC) 02/05/2022   Hepatic encephalopathy (HCC) 02/03/2022   Basal cell carcinoma (BCC) 05/06/2021   Cecal cancer (HCC) 05/06/2021   Hypertension 05/06/2021   COVID-19 vaccination declined 01/30/2021   Decompensated liver cirrhosis with portal HTN and gastric varices 12/04/2020   Essential hypertension 08/12/2020   Murmur, cardiac 03/19/2020   Hypothyroidism 06/30/2019   History of basal cell cancer 06/30/2019   Hx of colon cancer, stage III 11/30/2011   Esophageal varices (HCC) 06/12/2010   Portal hypertension (HCC) 01/23/2008  ONSET DATE: 01/29/2023  REFERRING DIAG: fracture of distal end of radius left  THERAPY DIAG:  Muscle weakness (generalized)  Stiffness of left wrist, not elsewhere classified  Other lack of coordination  Other symptoms and signs involving the musculoskeletal system  Rationale for Evaluation and Treatment: Rehabilitation  SUBJECTIVE:  SUBJECTIVE STATEMENT:  She is having a rough day with walking following PT yesterday.   Pt accompanied by: significant other - [name]  PERTINENT HISTORY:  Closed, L distal radius fracture, managing conservatively due to health issues. She is not a candidate for surgery.  Follows up with ortho 06/28/2023. Referred to OPOT for eval and treat.     PRECAUTIONS: Other: to tolerance  - wear brace with strenuous activity  WEIGHT BEARING RESTRICTIONS:  to tolerance LUE  PAIN:  Are you having pain? No - not in hand but her knee was bothering her today d/t rainy weather  FALLS: Has patient fallen in last 6 months? Yes. 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 navy officer and son-in Social worker is Runner, broadcasting/film/video); was driving   PATIENT GOALS: return to [employer]   OBJECTIVE:   HAND DOMINANCE: Right  ADLs: Overall ADLs: mod I  MOBILITY STATUS: Needs Assist: use of cane  ACTIVITY TOLERANCE: Activity tolerance: fair  FUNCTIONAL OUTCOME MEASURES: Quick Dash: 29.5% disability  - See image/specific items on eval 05/12/23  UPPER EXTREMITY ROM:     AROM Right (eval) Left (eval) Left 7/3  Shoulder flexion WNL    Shoulder abduction WNL    Elbow flexion WNL    Elbow extension WNL    Wrist flexion WNL 43* 50*  Wrist extension WNL 39* 38*  Wrist pronation WNL    Wrist supination WNL     Digit Composite Flexion WNL Lacks 4 cm index; 5 cm middle; 4 cm ring; 3.5 cm pinky Lacks 2 cm index; 3 cm middle;  ring and pinky - full  Digit Composite Extension WNL    Digit Opposition WNL WFL WNL  (Blank rows = not tested)  RUE IR limited to just behind hip  UPPER EXTREMITY MMT:     BUE: WFL shoulders and elbows  HAND FUNCTION: Grip strength: Right: 24.4 lbs; Left: 7.2 lbs  COORDINATION: 9 Hole Peg test: Right: 28 sec; Left: 38 sec  SENSATION: WFL  EDEMA: none reported or observed  COGNITION: Overall cognitive status: History of cognitive impairments - at  baseline  OBSERVATIONS: Pt appears well-kept. Ambulates with cane. No LOB though slow, altered gait.   TODAY'S TREATMENT:                                                                                                                               -  Therapeutic exercises completed for duration as noted below including: OT reviewed  L red Thera-Band HEP including shoulder flexion, horizontal abd/add, bicep curl, and tricep extension.  Pt able to complete 10 reps of each exercise with mod verbal cues for proper execution to promote strengthening of affected extremity and overall endurance.   - Therapeutic Activities  Pt played 2 games of Connect 4 with use of LUE for fine motor coordination. Pt then placed game pieces in board collecting 4 at a time then translating 1 at a time to tips of fingers to play.  Patient participated in 2 game of dominoes requiring min verbal cues for proper play, mod verbal cues to only use affected R hand with manipulation of dominoes, and increased time for manipulation of dominoes as desired for fine motor coordination. Pt then stacked dominoes vertically and horizontally.   PATIENT EDUCATION: Education details: Functional LUE use.  Person educated: Patient and Spouse Education method: Explanation, Demonstration, Tactile cues, Verbal cues, and Handouts Education comprehension: verbalized understanding, returned demonstration, verbal cues required, tactile cues required, and needs further education  HOME EXERCISE PROGRAM: 05/17/23 - Wrist ROM Access Code: [facility] 05/19/23 - Coordination Activities Handout with images 05/24/23 - Intro Putty Exercises Access Code: [email or web address] 06/02/2023 - red putty (updated) 06/16/2023: red theraband   GOALS:  SHORT TERM GOALS: Target date: 07/05/2023    Pt will demonstrate full composite flexion of L digits.  Baseline: Lacks 4 cm index; 5 cm middle; 4 cm ring; 3.5 cm Goal status: IN PROGRESS 06/09/23 Distance from palm still  lacking for first 2 fingers: 2.5 cm index; 1.5 cm middle; ring and pinkie can touch palm 06/14/23 Distance from palm still lacking for first 2 fingers: 1.5 cm index; 0.5 cm middle; ring and pinkie can touch palm  2.  Pt will demonstrate L internal rotation to middle of spine to aid with ADL completion. Baseline: just behind hip Goal status: MET   LONG TERM GOALS: Target date: 07/26/2023    Patient will demonstrate LUE HEP with 25% verbal cues or less for proper execution. Baseline:  Goal status: IN PROGRESS  2.  Patient will demonstrate at least 16% improvement with quick Dash score (reporting 13.5% disability or less) indicating improved functional use of affected extremity (opening containers, washing back, carrying objects/bags). Baseline: 29.5% disability with use of LUE Goal status: IN PROGRESS  3.  Pt will demonstrate at least 120* combined L wrist ext/flex AROM.  Baseline: L wrist ext - 39*; flex - 43* Goal status: IN PROGRESS 06/09/23: Wrist ext - 45* flex - 50* 06/14/23: wrist ext - 50* flex - 50*  4.  Patient will demonstrate at least 22 lbs L grip strength as needed to open jars and other containers. Baseline: 7.2 lbs 06/02/2023: 19.8lbs Goal status: REVISED 06/14/23 06/09/23 - still can't open a child proof medicine bottle  5.  Patient will complete nine-hole peg with use of L in 35 seconds or less. Baseline: 38 seconds Goal status: REVISED 06/14/23 06/09/23: 36:35 seconds    ASSESSMENT:  CLINICAL IMPRESSION: Pt demonstrates fair understanding of HEP as needed to progress towards goals. Discussed functional tasks such as board games and bread making to help with remaining deficits.  PERFORMANCE DEFICITS: in functional skills including ADLs, IADLs, coordination, ROM, strength, pain, Fine motor control, and UE functional use, cognitive skills including memory and thought  IMPAIRMENTS: are limiting patient from ADLs, IADLs, and work.   CO-MORBIDITIES: may have  co-morbidities  that affects occupational performance. Patient will benefit  from skilled OT to address above impairments and improve overall function.  REHAB POTENTIAL: Good   PLAN:  OT FREQUENCY: 1-2x/week  OT DURATION: additional 6 weeks  PLANNED INTERVENTIONS: self care/ADL training, therapeutic exercise, therapeutic activity, manual therapy, passive range of motion, splinting, ultrasound, paraffin, fluidotherapy, moist heat, contrast bath, patient/family education, DME and/or AE instructions, and Re-evaluation  RECOMMENDED OTHER SERVICES: none at this time  CONSULTED AND AGREED WITH PLAN OF CARE: Patient and family member/caregiver  PLAN FOR NEXT SESSION:  Review theraband HEP  Continue functional use of LUE in kitchen/laundry area ie) moving pots/cans etc  Modalities paraffin (potentially for home use) vs fluidotherapy/US) for progressing P/AROM and stretching wrist and digits   Delana Meyer, OT 06/23/2023, 2:54 PM

## 2023-06-24 ENCOUNTER — Ambulatory Visit: Payer: Medicare Other | Attending: Nurse Practitioner

## 2023-06-24 DIAGNOSIS — R29898 Other symptoms and signs involving the musculoskeletal system: Secondary | ICD-10-CM | POA: Diagnosis not present

## 2023-06-24 DIAGNOSIS — M25632 Stiffness of left wrist, not elsewhere classified: Secondary | ICD-10-CM | POA: Diagnosis not present

## 2023-06-24 DIAGNOSIS — M6281 Muscle weakness (generalized): Secondary | ICD-10-CM

## 2023-06-24 DIAGNOSIS — R5381 Other malaise: Secondary | ICD-10-CM | POA: Diagnosis not present

## 2023-06-24 DIAGNOSIS — R278 Other lack of coordination: Secondary | ICD-10-CM | POA: Diagnosis not present

## 2023-06-24 DIAGNOSIS — R2681 Unsteadiness on feet: Secondary | ICD-10-CM | POA: Diagnosis not present

## 2023-06-24 NOTE — Therapy (Signed)
OUTPATIENT PHYSICAL THERAPY TREATMENT NOTE   Patient Name: [name] MRN: [id number] DOB:[birth year], 72 y.o., female Today's Date: 06/24/2023  PCP: Eden Emms, NP  REFERRING PROVIDER: Eden Emms, NP   END OF SESSION:   PT End of Session - 06/24/23 1445     Visit Number 18    Number of Visits 22    Date for PT Re-Evaluation 06/28/23    Authorization Type UHC MCR    PT Start Time 1445    PT Stop Time 1525    PT Time Calculation (min) 40 min    Activity Tolerance Patient limited by pain;Treatment limited secondary to medical complications (Comment)    Behavior During Therapy [facility] for tasks assessed/performed                 Past Medical History:  Diagnosis Date   Acute urinary retention 05/04/2022   Allergy 2006 ?   Contrast dye   Arthritis 2016 ??   Knees and thumb   Cancer (HCC)    cecum   Cataract 2021   Surgery scheduled July 2023   Colon cancer [facility]) 2003   Elevated liver function tests    Esophageal varices (HCC)    Heart murmur On file   Hemorrhage of gastrointestinal tract 05/04/2011   Hypertension 2021   Hypothyroidism    Iron deficiency anemia    Liver disease    chemotherapy complication, per pt, shunts placed to bypass liver   Malignant neoplasm of cecum (HCC)    Portal hypertension (HCC)    Skin cancer 2019   Splenomegaly    Past Surgical History:  Procedure Laterality Date   COLON SURGERY  2004   Cancer   COSMETIC SURGERY  2021   Skin cancer   ESOPHAGEAL VARICE LIGATION     EYE SURGERY     HEMICOLECTOMY  01/08/2003   IR RADIOLOGIST EVAL & MGMT  12/20/2020   IR RADIOLOGIST EVAL & MGMT  05/29/2021   LIVER SURGERY     shunts placed after chemo complication   SKIN FULL THICKNESS GRAFT N/A 09/12/2019   Procedure: debridement and FTSG to the nose from left upper arm;  Surgeon: Allena Napoleon, MD;  Location:  [facility];  Service: Plastics;  Laterality: N/A;  2 hours, please   TIPS PROCEDURE     Patient Active  Problem List   Diagnosis Date Noted   Acute back pain with sciatica, left 06/09/2023   Bradycardia 05/07/2023   Sepsis due to group B Streptococcus with acute renal failure and septic shock (HCC) 04/24/2023   Septic shock (HCC) 04/21/2023   Adrenal insufficiency (HCC) 03/19/2023   Protein-calorie malnutrition, severe (HCC) 02/26/2023   Acute hepatic encephalopathy (HCC) 02/25/2023   Weight loss 02/24/2023   Bowel habit changes 02/24/2023   Generalized abdominal pain 02/24/2023   Hospital discharge follow-up 02/24/2023   Weakness 01/04/2023   Frequent UTI 05/18/2022   Anemia 05/18/2022   AKI (acute kidney injury) (HCC) 04/30/2022   Tachycardia-bradycardia syndrome (HCC) 02/05/2022   Hepatic encephalopathy (HCC) 02/03/2022   Basal cell carcinoma (BCC) 05/06/2021   Cecal cancer (HCC) 05/06/2021   Hypertension 05/06/2021   COVID-19 vaccination declined 01/30/2021   Decompensated liver cirrhosis with portal HTN and gastric varices 12/04/2020   Essential hypertension 08/12/2020   Murmur, cardiac 03/19/2020   Hypothyroidism 06/30/2019   History of basal cell cancer 06/30/2019   Hx of colon cancer, stage III 11/30/2011   Esophageal varices (HCC) 06/12/2010   Portal  hypertension (HCC) 01/23/2008    REFERRING DIAG: R53.1 (ICD-10-CM) - Weakness   THERAPY DIAG:  Muscle weakness (generalized)  Physical deconditioning  Unsteadiness on feet  Rationale for Evaluation and Treatment Rehabilitation  PERTINENT HISTORY:  See PMH  PRECAUTIONS: Fall  SUBJECTIVE:                                                                                                                                                                                      SUBJECTIVE STATEMENT: Reporting increased RLE symptoms from SI region to R knee following SI joint referral pattern.  Symptoms less but not yet at baseline at start of session today.  PAIN:  Are you having pain? Yes (chronic LBP)   OBJECTIVE:  (objective measures completed at initial evaluation unless otherwise dated)   DIAGNOSTIC FINDINGS: none   PATIENT SURVEYS:  FOTO 61(68 predicted); 04/28/23 45% 05/20/23 49% 06/15/23 48%   MUSCLE LENGTH: Not tested   POSTURE: rounded shoulders, forward head, and increased thoracic kyphosis   PALPATION: Deferred based on Dx   LOWER EXTREMITY ROM: WFL for transfers and gait   Active ROM Right eval Left eval  Hip flexion      Hip extension      Hip abduction      Hip adduction      Hip internal rotation      Hip external rotation      Knee flexion      Knee extension      Ankle dorsiflexion      Ankle plantarflexion      Ankle inversion      Ankle eversion       (Blank rows = not tested)   LOWER EXTREMITY MMT:   MMT Right eval Left eval R 04/28/23 L 6//24  Hip flexion 4- 4- 3+ 3+  Hip extension 4- 4- 3+ 3+  Hip abduction 4- 4- 3+ 3+  Hip adduction        Hip internal rotation        Hip external rotation        Knee flexion 4- 4- 3+ 3+  Knee extension 4- 4- 3+ 3+  Ankle dorsiflexion        Ankle plantarflexion 4- 4- 3 3  Ankle inversion        Ankle eversion         (Blank rows = not tested)   LOWER EXTREMITY SPECIAL TESTS:  Deferred    FUNCTIONAL TESTS:  30 seconds chair stand test 7 stands with UE assist 04/28/23 4 stands with UE support 05/20/23 7 stands with UE Support   GAIT: Distance walked: 70ftx2 Assistive device utilized: Single  point cane; 04/28/23 RW Level of assistance: SBA Comments: slow cadence,  05/18/23 2 MWT 17ft w/cane  06/08/23 2 MWT 221ft with cane   Vital signs:  O2 sats 96% HR 58   TODAY'S TREATMENT:      [facility] Adult PT Treatment:                                                DATE: 06/24/23 Therapeutic Exercise: FAQs 3# with adduction 15x  FAQs 3# alternating 15/15 Marching 3# 15/15 standing with UE support Standing hamstring curls 3# Heel raises with UE support 15x DF with UE support 15x Seated hamstring stretch 30s x2 over  stool Nustep L2 6 min  [facility] Adult PT Treatment:                                                DATE: 06/22/23 Therapeutic Exercise: STS w/o UE support 6 reps focus on form  352ft ambulation with cane  WS onto 6 in block A/P and M/L 5 x each leg  Neuromuscular re-ed: Standing cone taps unilateral 5x w/without UE support Standing 2 cone taps from airex single taps progressing to alternating taps limiting UE support    [facility] Adult PT Treatment:                                                DATE: 06/17/23 Therapeutic Exercise: FAQs 3# with adduction 15x  FAQs 3# alternating 15/15 Marching 3# 15/15 against bolster Standing hamstring curls 3# Heel raises with UE support 15x DF with UE support 15x Seated hamstring stretch 30s x2 Standing gastroc stretch on slant board 30x x2 Nustep L2 6 min   [facility] Adult PT Treatment:                                                DATE: 06/15/23  Therapeutic Activity:  Standing abduction 10/10 with foam roll support    06/15/23 0001  Berg Balance Test  Sit to Stand 3  Standing Unsupported 4  Sitting with Back Unsupported but Feet Supported on Floor or Stool 4  Stand to Sit 3  Transfers 3  Standing Unsupported with Eyes Closed 4  Standing Unsupported with Feet Together 4  From Standing, Reach Forward with Outstretched Arm 4  From Standing Position, Pick up Object from Floor 3  From Standing Position, Turn to Look Behind Over each Shoulder 4  Turn 360 Degrees 2  Standing Unsupported, Alternately Place Feet on Step/Stool 3  Standing Unsupported, One Foot in Front 3  Standing on One Leg 3  Total Score 47   16 Steps with cane using step to pattern   [facility] Adult PT Treatment:  DATE: 06/10/23 Therapeutic Exercise: Static stand on airex 30s hold wide/narrow BOS, EO,EC 4 bouts Stepping and weight shifting at counter 4 in step 10/10 no UE support  Side stepping at countertop 3 trips with fingertip  support Tandem and retrowalking at counter 3 trips 2x5 STS from airex 5xSTS from Airex with [facility] reach   [facility] Adult PT Treatment:                                                DATE: 06/08/23 Therapeutic Exercise: Static stand on airex 30s hold wide/narrow BOS, EO,EC 4 bouts Stepping and weight shifting at counter 4 in step 10/10 no UE support  Side stepping at countertop 3 trips with fingertip support Tandem and retrowalking at counter 3 trips 5x STS from airex FAQs 3# with adduction 15x  FAQs 3# alternating 15/15  [facility] Adult PT Treatment:                                                DATE: 06/04/23 Therapeutic Exercise: Seated latissimus press 3s 10x Seated lat press with FAQs 10/10 Seated lat press with marching 10/10 Standing abduction with foam roll support 10/10 CGA Nustep L1 8 min 5x STS from airex STS 2x5      PATIENT EDUCATION:  Education details: Discussed eval findings, rehab rationale and POC and patient is in agreement  Person educated: Patient Education method: Explanation Education comprehension: verbalized understanding and needs further education   HOME EXERCISE PROGRAM: Access Code: [email or web address] URL: [email or web address] Date: 04/06/2023 Prepared by: Gustavus Bryant   Exercises - Sit to Stand with Armchair  - 2 x daily - 5 x weekly - 1 sets - 5 reps - Standing Heel Raise with Support  - 2 x daily - 5 x weekly - 1-2 sets - 10 reps   ASSESSMENT:   CLINICAL IMPRESSION: Due to elevated symptoms resembling SI joint and piriformis spasm, standing balance tasks deferred in favor of strengthening exercises.  Sciatic tension test asymptomatic.  Unable to obtain differential diagnosis as patient unable to tolerate even superficial palpation of effected region in standing   OBJECTIVE IMPAIRMENTS: Abnormal gait, decreased activity tolerance, decreased balance, decreased endurance, decreased mobility, difficulty walking, decreased strength, and postural  dysfunction.    ACTIVITY LIMITATIONS: carrying, lifting, squatting, stairs, and locomotion level   PARTICIPATION LIMITATIONS: meal prep, cleaning, laundry, shopping, community activity, and yard work   PERSONAL FACTORS: Fitness, Past/current experiences, Time since onset of injury/illness/exacerbation, and 1 comorbidity: hepatic cirrhosis   are also affecting patient's functional outcome.    REHAB POTENTIAL: Good   CLINICAL DECISION MAKING: Stable/uncomplicated   EVALUATION COMPLEXITY: Low     GOALS: Goals reviewed with patient? No   SHORT TERM GOALS: Target date: 05/27/2023   Patient to demonstrate independence in HEP  Baseline: [id number] Goal status: MET   2.  250ft ambulation with SPC and S Baseline: 25ft with SPC and SBA; 76ft with RW and SBA; 06/08/23 265ft Goal status: Met       LONG TERM GOALS: Target date: 07/18/2023     Increase BLE strength to 4/5 Baseline:  MMT Right eval Left eval R 04/28/23 L 6//24  Hip flexion 4- 4- 3+ 3+  Hip extension 4- 4-  3+ 3+  Hip abduction 4- 4- 3+ 3+  Hip adduction        Hip internal rotation        Hip external rotation        Knee flexion 4- 4- 3+ 3+  Knee extension 4- 4- 3+ 3+  Ankle dorsiflexion        Ankle plantarflexion 4- 4- 3 3    Goal status: INITIAL   2.  Increase BERG score to 50 Baseline: 44; 06/15/23 47 Goal status: Ongoing   3.  Increase 30s chair stand test to 10 reps w/UE support Baseline: 7 stands with UE support; 04/28/23 4 stands with UE support Goal status: Ongoing   4.  Patient to negotiate 16 steps with single rail, cane and appropriate step pattern Baseline: TBD; 06/15/23 16 steps with step to pattern single rail and cane Goal status: Ongoing  5.  Increase FOTO score to 68% Baseline: 45%; 06/15/23 48% Goal status: Ongoing         PLAN:   PT FREQUENCY: 1-2x/week   PT DURATION: 8 weeks   PLANNED INTERVENTIONS: Therapeutic exercises, Therapeutic activity, Neuromuscular re-education, Balance  training, Gait training, Patient/Family education, Self Care, Joint mobilization, Stair training, DME instructions, Manual therapy, and Re-evaluation   PLAN FOR NEXT SESSION: HEP review and update, manual techniques as appropriate, aerobic tasks, ROM and flexibility activities, strengthening and PREs, TPDN, gait and balance training as needed       Hildred Laser, PT 06/24/2023, 3:50 PM

## 2023-06-28 ENCOUNTER — Other Ambulatory Visit (INDEPENDENT_AMBULATORY_CARE_PROVIDER_SITE_OTHER): Payer: Medicare Other

## 2023-06-28 DIAGNOSIS — E2740 Unspecified adrenocortical insufficiency: Secondary | ICD-10-CM

## 2023-06-28 LAB — CORTISOL: Cortisol, Plasma: 8.8 ug/dL

## 2023-06-30 NOTE — Progress Notes (Signed)
Office Visit    Patient Name: [name] Date of Encounter: 07/02/2023  Primary Care Provider:  Eden Emms, NP Primary Cardiologist:  None Primary Electrophysiologist: None   Past Medical History    Past Medical History:  Diagnosis Date   Acute urinary retention 05/04/2022   Allergy 2006 ?   Contrast dye   Arthritis 2016 ??   Knees and thumb   Cancer (HCC)    cecum   Cataract 2021   Surgery scheduled July 2023   Colon cancer [facility]) 2003   Elevated liver function tests    Esophageal varices (HCC)    Heart murmur On file   Hemorrhage of gastrointestinal tract 05/04/2011   Hypertension 2021   Hypothyroidism    Iron deficiency anemia    Liver disease    chemotherapy complication, per pt, shunts placed to bypass liver   Malignant neoplasm of cecum (HCC)    Portal hypertension (HCC)    Skin cancer 2019   Splenomegaly    Past Surgical History:  Procedure Laterality Date   COLON SURGERY  2004   Cancer   COSMETIC SURGERY  2021   Skin cancer   ESOPHAGEAL VARICE LIGATION     EYE SURGERY     HEMICOLECTOMY  01/08/2003   IR RADIOLOGIST EVAL & MGMT  12/20/2020   IR RADIOLOGIST EVAL & MGMT  05/29/2021   LIVER SURGERY     shunts placed after chemo complication   SKIN FULL THICKNESS GRAFT N/A 09/12/2019   Procedure: debridement and FTSG to the nose from left upper arm;  Surgeon: Allena Napoleon, MD;  Location: [facility];  Service: Plastics;  Laterality: N/A;  2 hours, please   TIPS PROCEDURE      Allergies  Allergies  Allergen Reactions   Contrast Media [Iodinated Contrast Media] Hives     History of Present Illness    [name]  is a 72 year old female with a PMH of colon cancer s/p colectomy with chemotherapy in 2003, hypothyroidism, HTN, nonalcoholic cirrhosis with esophageal varices s/p TIPS in 2012, hepatic encephalopathy due to COVID-19 infection 07/2020 who presents today for 92-month follow-up.  [name] was seen initially for  consult due to SVT and bradycardia during recent hospitalization for acute hepatic encephalopathy.  She was originally admitted 02/26/2023 after presenting with altered mental status and ammonia levels were found to be elevated at 163. She improved with lactulose and patient developed brief runs of SVT with rates in the 130s to 140s.  She also experienced bradycardia with pauses.  She then started having bradycardia with rates in the 30s and was given atropine and transferred to [facility] for management in the ICU.  She was given a bolus of amiodarone but was deemed not a candidate for long-term dosing due to history of cirrhosis.  She was seen in follow-up on 04/02/2023 and was feeling much better with stable blood pressure.  She reported increased fatigue since previous hospitalization and was tolerating lactulose evidence of loose stool or constipation.  She was provided a 14-day ZIO monitor to evaluate for arrhythmias that showed predominantly sinus rhythm with minimum heart rate of 43 arrhythmias or pauses.  He had a 2D echo completed on 03/2023 that showed EF of 55% with no RWMA and trivial MVR with mildly dilated LA and moderately dilated pulmonary artery pressure with mild to moderate TVR.  Since last being seen in the office patient reports she has doing well since  her previous visit she reports some occasional episodes of fatigue chest pain or shortness of breath.  Blood pressure today was 92/51 and heart rate was 60 bpm.  She reports taking her medications prior to today's visit.  She is euvolemic on examination today.  She is currently in PT OT due to her fractured wrist and is hoping to increase physical activity with starting a walking program.  She denies any palpitations or tachycardia since her previous visit.  Patient denies chest pain, palpitations, dyspnea, PND, orthopnea, nausea, vomiting, dizziness, syncope, edema, weight gain, or early satiety.  Home Medications    Current Outpatient  Medications  Medication Sig Dispense Refill   acidophilus (RISAQUAD) CAPS capsule Take 1 capsule by mouth daily.     CRANBERRY PO Take 1 tablet by mouth daily.     estradiol (ESTRACE) 0.1 MG/GM vaginal cream Place 1 Applicatorful vaginally every other day.     furosemide (LASIX) 20 MG tablet Take 1 tablet (20 mg total) by mouth daily. 30 tablet 2   hydrocortisone (CORTEF) 5 MG tablet Take 1 tablet (5 mg total) by mouth 3 (three) times daily. 90 tablet 1   lactulose (CHRONULAC) 10 GM/15ML solution Take 45 mLs (30 g total) by mouth 3 (three) times daily. 236 mL 0   levOCARNitine (CARNITOR) 330 MG tablet Take 330 mg by mouth 3 (three) times daily.     liver oil-zinc oxide (DESITIN) 40 % ointment Apply topically daily as needed for irritation. (Patient taking differently: Apply 1 Application topically daily as needed for irritation.) 56.7 g 0   Multiple Vitamin (MULTI-VITAMIN) tablet Take 1 tablet by mouth daily.     polyethylene glycol (MIRALAX / GLYCOLAX) 17 g packet Take 17 g by mouth daily as needed for mild constipation. 14 each 0   spironolactone (ALDACTONE) 25 MG tablet Take 1 tablet (25 mg total) by mouth daily. 30 tablet 2   SYNTHROID 75 MCG tablet Take 1 tablet (75 mcg total) by mouth daily before breakfast. Must be seen in office for more refills. 90 tablet 1   No current facility-administered medications for this visit.     Review of Systems  Please see the history of present illness.    (+) Fatigue  All other systems reviewed and are otherwise negative except as noted above.  Physical Exam    Wt Readings from Last 3 Encounters:  07/02/23 150 lb 9.6 oz (68.3 kg)  06/09/23 146 lb (66.2 kg)  05/21/23 141 lb 12.8 oz (64.3 kg)   VS: Vitals:   07/02/23 1122  BP: (!) 92/51  Pulse: 68  SpO2: 98%  ,Body mass index is 25.85 kg/m.  Constitutional:      Appearance: Healthy appearance. Not in distress.  Neck:     Vascular: JVD normal.  Pulmonary:     Effort: Pulmonary effort  is normal.     Breath sounds: No wheezing. No rales. Diminished in the bases Cardiovascular:     Normal rate. Regular rhythm. Normal S1. Normal S2.      Murmurs: There is no murmur.  Edema:    Peripheral edema absent.  Abdominal:     Palpations: Abdomen is soft non tender. There is no hepatomegaly.  Skin:    General: Skin is warm and dry.  Neurological:     General: No focal deficit present.     Mental Status: Alert and oriented to person, place and time.     Cranial Nerves: Cranial nerves are intact.  EKG/LABS/ Recent Cardiac  Studies    ECG personally reviewed by me today -none completed today  Lab Results  Component Value Date   WBC 5.4 04/27/2023   HGB 10.2 (L) 04/27/2023   HCT 30.7 (L) 04/27/2023   MCV 103.4 (H) 04/27/2023   PLT 156 04/27/2023   Lab Results  Component Value Date   CREATININE 0.63 05/05/2023   BUN 13 05/05/2023   NA 139 05/05/2023   K 3.9 05/05/2023   CL 102 05/05/2023   CO2 28 05/05/2023   Lab Results  Component Value Date   ALT 28 04/27/2023   AST 66 (H) 04/27/2023   ALKPHOS 213 (H) 04/27/2023   BILITOT 1.0 04/27/2023   Lab Results  Component Value Date   CHOL 159 09/14/2018   HDL 73.60 09/14/2018   LDLCALC 77 09/14/2018   TRIG 83 08/12/2020   CHOLHDL 2 09/14/2018    Lab Results  Component Value Date   HGBA1C 5.1 08/14/2020     Assessment & Plan    1.Supraventricular tachycardia: -Patient developed intermittent run of SVT while being treated for hepatic encephalopathy.  Patient reports no recurrence of palpitations or tachycardia since hospitalization.  2.Essential hypertension: -Patient's blood pressure today was controlled today at 92/51 -Patient is currently on Lasix 20 mg and we will change this to as needed and continue to monitor blood pressures  3.Acute hepatic encephalopathy:  -Continue treatment plan per PCP. -Continue spironolactone 25 mg daily  4.Liver cirrhosis with portal hypertension: -s/p TIPS in 2012 at [facility]. -Continues to be monitored by [facility]    Disposition: Follow-up with None or APP in 6 months    Medication Adjustments/Labs and Tests Ordered: Current medicines are reviewed at length with the patient today.  Concerns regarding medicines are outlined above.   Signed, Napoleon Form, Leodis Rains, NP 07/02/2023, 12:07 PM [facility]

## 2023-07-02 ENCOUNTER — Encounter: Payer: Self-pay | Admitting: Nurse Practitioner

## 2023-07-02 ENCOUNTER — Ambulatory Visit: Payer: Medicare Other | Admitting: Nurse Practitioner

## 2023-07-02 VITALS — BP 92/51 | HR 68 | Ht 64.0 in | Wt 150.6 lb

## 2023-07-02 DIAGNOSIS — K7682 Hepatic encephalopathy: Secondary | ICD-10-CM

## 2023-07-02 DIAGNOSIS — K766 Portal hypertension: Secondary | ICD-10-CM | POA: Diagnosis not present

## 2023-07-02 DIAGNOSIS — I10 Essential (primary) hypertension: Secondary | ICD-10-CM | POA: Diagnosis not present

## 2023-07-02 DIAGNOSIS — I471 Supraventricular tachycardia, unspecified: Secondary | ICD-10-CM

## 2023-07-02 MED ORDER — FUROSEMIDE 20 MG PO TABS: 20.0000 mg | ORAL_TABLET | Freq: Every day | ORAL | Status: DC | PRN

## 2023-07-02 NOTE — Patient Instructions (Signed)
Medication Instructions:  CHANGE Lasix to take 1 tablet as needed *If you need a refill on your cardiac medications before your next appointment, please call your pharmacy*   Lab Work: None ordered   Testing/Procedures: None ordered   Follow-Up: At [facility], you and your health needs are our priority.  As part of our continuing mission to provide you with exceptional heart care, we have created designated Provider Care Teams.  These Care Teams include your primary Cardiologist (physician) and Advanced Practice Providers (APPs -  Physician Assistants and Nurse Practitioners) who all work together to provide you with the care you need, when you need it.  We recommend signing up for the patient portal called "MyChart".  Sign up information is provided on this After Visit Summary.  MyChart is used to connect with patients for Virtual Visits (Telemedicine).  Patients are able to view lab/test results, encounter notes, upcoming appointments, etc.  Non-urgent messages can be sent to your provider as well.   To learn more about what you can do with MyChart, go to [email or web address].    Your next appointment:   6 month(s)  Provider:   Riley Lam, MD   Other Instructions

## 2023-07-06 ENCOUNTER — Ambulatory Visit: Payer: Medicare Other | Admitting: Occupational Therapy

## 2023-07-06 DIAGNOSIS — M6281 Muscle weakness (generalized): Secondary | ICD-10-CM | POA: Diagnosis not present

## 2023-07-06 DIAGNOSIS — R29898 Other symptoms and signs involving the musculoskeletal system: Secondary | ICD-10-CM

## 2023-07-06 DIAGNOSIS — R5381 Other malaise: Secondary | ICD-10-CM | POA: Diagnosis not present

## 2023-07-06 DIAGNOSIS — R278 Other lack of coordination: Secondary | ICD-10-CM | POA: Diagnosis not present

## 2023-07-06 DIAGNOSIS — M25632 Stiffness of left wrist, not elsewhere classified: Secondary | ICD-10-CM

## 2023-07-06 DIAGNOSIS — R2681 Unsteadiness on feet: Secondary | ICD-10-CM | POA: Diagnosis not present

## 2023-07-06 NOTE — Therapy (Signed)
OUTPATIENT OCCUPATIONAL THERAPY ORTHO TREATMENT Patient Name: [name] MRN: [id number] DOB:10/29/1951, 72 y.o., female Today's Date: 07/06/2023  PCP: Eden Emms, NP  REFERRING PROVIDER: Marlyne Beards, MD  END OF SESSION:  OT End of Session - 07/06/23 1532     Visit Number 14    Number of Visits 22    Date for OT Re-Evaluation 07/30/23    Authorization Type [employer] Medicare    Progress Note Due on Visit 20    OT Start Time 1532    OT Stop Time 1612    OT Time Calculation (min) 40 min    Activity Tolerance Patient tolerated treatment well;No increased pain    Behavior During Therapy [facility] for tasks assessed/performed             Past Medical History:  Diagnosis Date   Acute urinary retention 05/04/2022   Allergy 2006 ?   Contrast dye   Arthritis 2016 ??   Knees and thumb   Cancer (HCC)    cecum   Cataract 2021   Surgery scheduled July 2023   Colon cancer [facility]) 2003   Elevated liver function tests    Esophageal varices (HCC)    Heart murmur On file   Hemorrhage of gastrointestinal tract 05/04/2011   Hypertension 2021   Hypothyroidism    Iron deficiency anemia    Liver disease    chemotherapy complication, per pt, shunts placed to bypass liver   Malignant neoplasm of cecum (HCC)    Portal hypertension (HCC)    Skin cancer 2019   Splenomegaly    Past Surgical History:  Procedure Laterality Date   COLON SURGERY  2004   Cancer   COSMETIC SURGERY  2021   Skin cancer   ESOPHAGEAL VARICE LIGATION     EYE SURGERY     HEMICOLECTOMY  01/08/2003   IR RADIOLOGIST EVAL & MGMT  12/20/2020   IR RADIOLOGIST EVAL & MGMT  05/29/2021   LIVER SURGERY     shunts placed after chemo complication   SKIN FULL THICKNESS GRAFT N/A 09/12/2019   Procedure: debridement and FTSG to the nose from left upper arm;  Surgeon: Allena Napoleon, MD;  Location: [facility];  Service: Plastics;  Laterality: N/A;  2 hours, please   TIPS PROCEDURE     Patient Active  Problem List   Diagnosis Date Noted   Acute back pain with sciatica, left 06/09/2023   Bradycardia 05/07/2023   Sepsis due to group B Streptococcus with acute renal failure and septic shock (HCC) 04/24/2023   Septic shock (HCC) 04/21/2023   Adrenal insufficiency (HCC) 03/19/2023   Protein-calorie malnutrition, severe (HCC) 02/26/2023   Acute hepatic encephalopathy (HCC) 02/25/2023   Weight loss 02/24/2023   Bowel habit changes 02/24/2023   Generalized abdominal pain 02/24/2023   Hospital discharge follow-up 02/24/2023   Weakness 01/04/2023   Frequent UTI 05/18/2022   Anemia 05/18/2022   AKI (acute kidney injury) (HCC) 04/30/2022   Tachycardia-bradycardia syndrome (HCC) 02/05/2022   Hepatic encephalopathy (HCC) 02/03/2022   Basal cell carcinoma (BCC) 05/06/2021   Cecal cancer (HCC) 05/06/2021   Hypertension 05/06/2021   COVID-19 vaccination declined 01/30/2021   Decompensated liver cirrhosis with portal HTN and gastric varices 12/04/2020   Essential hypertension 08/12/2020   Murmur, cardiac 03/19/2020   Hypothyroidism 06/30/2019   History of basal cell cancer 06/30/2019   Hx of colon cancer, stage III 11/30/2011   Esophageal varices (HCC) 06/12/2010   Portal hypertension (HCC) 01/23/2008  ONSET DATE: 01/29/2023  REFERRING DIAG: fracture of distal end of radius left  THERAPY DIAG:  Muscle weakness (generalized)  Stiffness of left wrist, not elsewhere classified  Other lack of coordination  Other symptoms and signs involving the musculoskeletal system  Rationale for Evaluation and Treatment: Rehabilitation  SUBJECTIVE:  SUBJECTIVE STATEMENT:  She was released from ortho, who was pleased with her progress.   Pt accompanied by: significant other - [name]  PERTINENT HISTORY:  Closed, L distal radius fracture, managing conservatively due to health issues. She is not a candidate for surgery.  Follows up with ortho 06/28/2023. Referred to OPOT for eval and treat.     PRECAUTIONS: Other: to tolerance  - wear brace with strenuous activity  WEIGHT BEARING RESTRICTIONS:  to tolerance LUE  PAIN:  Are you having pain? No - not in hand but her knee was bothering her today d/t rainy weather  FALLS: Has patient fallen in last 6 months? Yes. 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 navy officer and son-in Social worker is Runner, broadcasting/film/video); was driving   PATIENT GOALS: return to [employer]   OBJECTIVE:   HAND DOMINANCE: Right  ADLs: Overall ADLs: mod I  MOBILITY STATUS: Needs Assist: use of cane  ACTIVITY TOLERANCE: Activity tolerance: fair  FUNCTIONAL OUTCOME MEASURES: Quick Dash: 29.5% disability  - See image/specific items on eval 05/12/23  UPPER EXTREMITY ROM:     AROM Right (eval) Left (eval) Left 7/3  Shoulder flexion WNL    Shoulder abduction WNL    Elbow flexion WNL    Elbow extension WNL    Wrist flexion WNL 43* 50*  Wrist extension WNL 39* 38*  Wrist pronation WNL    Wrist supination WNL     Digit Composite Flexion WNL Lacks 4 cm index; 5 cm middle; 4 cm ring; 3.5 cm pinky Lacks 2 cm index; 3 cm middle;  ring and pinky - full  Digit Composite Extension WNL    Digit Opposition WNL WFL WNL  (Blank rows = not tested)  RUE IR limited to just behind hip  UPPER EXTREMITY MMT:     BUE: WFL shoulders and elbows  HAND FUNCTION: Grip strength: Right: 24.4 lbs; Left: 7.2 lbs  COORDINATION: 9 Hole Peg test: Right: 28 sec; Left: 38 sec  SENSATION: WFL  EDEMA: none reported or observed  COGNITION: Overall cognitive status: History of cognitive impairments - at  baseline  OBSERVATIONS: Pt appears well-kept. Ambulates with cane. No LOB though slow, altered gait.   TODAY'S TREATMENT:                                                                                                                               -  Therapeutic exercises completed for duration as noted below including: Wrist flex and ext with yellow flex bar x 2 min for strength and endurance of affected extremity  Supination with yellow flex bar x 2 min for strength and endurance of affected extremity  Pronation with yellow flex bar x 2 min for strength and endurance of affected extremity  Pt held yellow flexbar in affected left hand to hit side of table x 1 min for proprioceptive input as needed to tolerate impact and force through this extremity in addition to grip strength to maintain hold of item.   Pt completed supination and pronation roll with yellow flex bar in LUE x 2 min for ROM and endurance of affected extremity   - Therapeutic activities completed for duration as noted below including: Using looper hoop, patient completed left wrist circles x2 minutes with flexed wrist and then with extended wrist for 2 additional minutes  Pt completed caterpillar bead puzzles x 5 patterns collecting the 5 colored beads needed for the puzzle in hand, then using in hand manipulation to transition bead to tips of thumb and index finger before placing into board for improved left hand strength and fine motor coordination.  Pt used star, rainbow, lion, waves, clover, and ladder Mindful Maze with affected LUE for improved proprioception, ROM, and fine motor control. Pt demonstrating moderate errors with completion.   Patient picked up single golf ball in palm of L hand without use of digits for intrinsic strengthening, supinated golf ball, and then placed ball onto table x10 repetitions.  PATIENT EDUCATION: Education details: LUE strengthening Person educated: Patient and Spouse Education method:  Explanation, Demonstration, Tactile cues, Verbal cues, and Handouts Education comprehension: verbalized understanding, returned demonstration, verbal cues required, tactile cues required, and needs further education  HOME EXERCISE PROGRAM: 05/17/23 - Wrist ROM Access Code: [facility] 05/19/23 - Coordination Activities Handout with images 05/24/23 - Intro Putty Exercises Access Code: [email or web address] 06/02/2023 - red putty (updated) 06/16/2023: red theraband   GOALS:  SHORT TERM GOALS: Target date: 07/05/2023    Pt will demonstrate full composite flexion of L digits.  Baseline: Lacks 4 cm index; 5 cm middle; 4 cm ring; 3.5 cm Goal status: MET 06/09/23 Distance from palm still lacking for first 2 fingers: 2.5 cm index; 1.5 cm middle; ring and pinkie can touch palm 06/14/23 Distance from palm still lacking for first 2 fingers: 1.5 cm index; 0.5 cm middle; ring and pinkie can touch palm  2.  Pt will demonstrate L internal rotation to middle of spine to aid with ADL completion. Baseline: just behind hip Goal status: MET   LONG TERM GOALS: Target date: 07/26/2023    Patient will demonstrate LUE HEP with 25% verbal cues or less for proper execution. Baseline:  Goal status: IN PROGRESS  2.  Patient will demonstrate at least 16% improvement with quick Dash score (reporting 13.5% disability or less) indicating improved functional use of affected extremity (opening containers, washing back, carrying objects/bags). Baseline: 29.5% disability with use of LUE Goal status: IN PROGRESS  3.  Pt will demonstrate at least 120* combined L wrist ext/flex AROM.  Baseline: L wrist ext - 39*; flex - 43* Goal status: IN PROGRESS 06/09/23: Wrist ext - 45* flex - 50* 06/14/23: wrist ext - 50* flex - 50*  4.  Patient will demonstrate at least 22 lbs L grip strength as needed to open jars and other containers. Baseline: 7.2 lbs 06/02/2023: 19.8lbs Goal status: REVISED 06/14/23 06/09/23 - still can't open a  child proof  medicine bottle  5.  Patient will complete nine-hole peg with use of L in 35 seconds or less. Baseline: 38 seconds Goal status: REVISED 06/14/23 06/09/23: 36:35 seconds    ASSESSMENT:  CLINICAL IMPRESSION: Pt demonstrates fair understanding of HEP as needed to progress towards goals. Discussed functional tasks such as board games and bread making to help with remaining deficits.  PERFORMANCE DEFICITS: in functional skills including ADLs, IADLs, coordination, ROM, strength, pain, Fine motor control, and UE functional use, cognitive skills including memory and thought  IMPAIRMENTS: are limiting patient from ADLs, IADLs, and work.   CO-MORBIDITIES: may have co-morbidities  that affects occupational performance. Patient will benefit from skilled OT to address above impairments and improve overall function.  REHAB POTENTIAL: Good   PLAN:  OT FREQUENCY: 1-2x/week  OT DURATION: additional 6 weeks  PLANNED INTERVENTIONS: self care/ADL training, therapeutic exercise, therapeutic activity, manual therapy, passive range of motion, splinting, ultrasound, paraffin, fluidotherapy, moist heat, contrast bath, patient/family education, DME and/or AE instructions, and Re-evaluation  RECOMMENDED OTHER SERVICES: none at this time  CONSULTED AND AGREED WITH PLAN OF CARE: Patient and family member/caregiver  PLAN FOR NEXT SESSION:  L hand strengthening and endurance.  Continue functional use of LUE in kitchen/laundry area ie) moving pots/cans etc  Modalities paraffin (potentially for home use) vs fluidotherapy/US) for progressing P/AROM and stretching wrist and digits   Delana Meyer, OT 07/06/2023, 4:54 PM

## 2023-07-09 ENCOUNTER — Ambulatory Visit: Payer: Medicare Other | Admitting: Internal Medicine

## 2023-07-09 ENCOUNTER — Encounter: Payer: Self-pay | Admitting: Internal Medicine

## 2023-07-09 VITALS — BP 114/60 | HR 64 | Ht 62.25 in | Wt 156.0 lb

## 2023-07-09 DIAGNOSIS — K7460 Unspecified cirrhosis of liver: Secondary | ICD-10-CM

## 2023-07-09 DIAGNOSIS — K7682 Hepatic encephalopathy: Secondary | ICD-10-CM

## 2023-07-09 DIAGNOSIS — K766 Portal hypertension: Secondary | ICD-10-CM

## 2023-07-09 DIAGNOSIS — Z85038 Personal history of other malignant neoplasm of large intestine: Secondary | ICD-10-CM

## 2023-07-09 NOTE — Progress Notes (Signed)
HISTORY OF PRESENT ILLNESS:  [name] is a 72 y.o. female with drug-induced/MASH hepatic cirrhosis who presents today with her husband after recent hospitalization for cellulitis with associated exacerbation of hepatic encephalopathy.  I last saw the patient as a new patient to me (previously followed by Dr. Orvan Falconer) Apr 20, 2023.  The following is the comprehensive history from that encounter.  "She is followed by [facility].  Most recent evaluations on 09/08/2022 and and post hospital follow-up March 16, 2023.  Previous patient of Dr. Tressia Danas.  After recent hospitalization for encephalopathy, is sent to reestablish with new GI physician.  Last office visit with Dr. Orvan Falconer February 21, 2019.  Patient with decompensated cirrhosis likely secondary to oxaliplatin induced portal hypertension and possible component of mASH.  Patient has a history of Adenocarcinoma of the cecum, Stage IIIB, T3N1 with matted lymph node mass measuring 4 cm, S/P hemicolectomy and adjuvant FOLFOX x 12 cycles from 02/05/03-09/24/03. She has not had surveillance colonoscopy in several years. Past medical history is also notable for basal cell carcinoma of the skin, hypertension, hypothyroid, and obesity.  She developed oxaliplatin induced portal hypertension with esophageal varices and had a variceal hemorrhage in 2012 for which she underwent TIPS at [facility]. Her most recent EGD was in 2010. She had Doppler ultrasound of the liver in January 2022 that questioned possible TIPS stenosis. She was seen by interventional radiology and decision was made not to attempt revision given new hepatic encephalopathy. Plan is to possibly perform EGD by Dr. Orvan Falconer in the near future once her encephalopathy is better managed. She denies any melena, hematemesis, or hematochezia.  She likely also has cirrhosis based on cirrhotic morphology of the liver, thrombocytopenia, and hepatic  encephalopathy.  Patient has had multiple hospitalizations for encephalopathy and most recently was admitted at [facility] from 02/25/2023 through 03/13/2023 with encephalopathy, AKI, cystitis, and bradycardia with SVT.  This hospitalization occurred after patient fell and broke her left wrist.  Thereafter was given oxycodone.  Became constipated and encephalopathic.  Was treated with lactulose via NG tube and Xifaxan in the hospital.  UTI treated.  Discharged home after 16 days.   Patient tells me that she is currently taking 45 mL of lactulose 3 times daily.  Generally has 1 or 2 bowel movements.  Has not taken Xifaxan since leaving the hospital due to cost.  Told that she is at her baseline with regards to mentation.  She works as a Comptroller.  Currently other GI medication is low-dose Aldactone at 25 mg daily for edema.  During her recent hospitalization ultrasound showed patent TIPS.  Cirrhotic morphology without liver lesion or mass.   LABORATORIES 2024: Liver function test: 67/49/325/1 0.1 Basic metabolic panel: Normal.  Sodium 143 CBC: White blood cell count 2.5, hemoglobin 9.7, platelets 129 INR: 1.8"  She was hospitalized May 29 through April 27, 2023.  I have reviewed that encounter.  Treated for cellulitis.  Told to follow-up in this office.  No problems with medications since discharge.  She is currently on lactulose 45 cc 3 times daily.  Having 1 large bowel movement per day.  Recently saw cardiology.  Told to take Lasix on a as needed basis.  Continues on low-dose Aldactone 25 mg daily.  Has chronic problems of lower extremity edema to varying extent.  No other issues or complaints.  Her husband feels that she is doing her best since September 2021 after being afflicted  with COVID.  REVIEW OF SYSTEMS:  All non-GI ROS noncontributory  Past Medical History:  Diagnosis Date   Acute urinary retention 05/04/2022   Allergy 2006 ?   Contrast dye   Arthritis 2016 ??   Knees and thumb    Cancer (HCC)    cecum   Cataract 2021   Surgery scheduled July 2023   Colon cancer [facility]) 2003   Elevated liver function tests    Esophageal varices (HCC)    Heart murmur On file   Hemorrhage of gastrointestinal tract 05/04/2011   Hypertension 2021   Hypothyroidism    Iron deficiency anemia    Liver disease    chemotherapy complication, per pt, shunts placed to bypass liver   Malignant neoplasm of cecum (HCC)    Portal hypertension (HCC)    Skin cancer 2019   Splenomegaly     Past Surgical History:  Procedure Laterality Date   COLON SURGERY  2004   Cancer   COSMETIC SURGERY  2021   Skin cancer   ESOPHAGEAL VARICE LIGATION     EYE SURGERY     HEMICOLECTOMY  01/08/2003   IR RADIOLOGIST EVAL & MGMT  12/20/2020   IR RADIOLOGIST EVAL & MGMT  05/29/2021   LIVER SURGERY     shunts placed after chemo complication   SKIN FULL THICKNESS GRAFT N/A 09/12/2019   Procedure: debridement and FTSG to the nose from left upper arm;  Surgeon: Allena Napoleon, MD;  Location: [facility];  Service: Plastics;  Laterality: N/A;  2 hours, please   TIPS PROCEDURE      Social History [name]  reports that she has never smoked. She has never used smokeless tobacco. She reports that she does not drink alcohol and does not use drugs.  family history includes Arthritis in her father and mother; Cancer in her maternal aunt; Diabetes in her father; Hearing loss in her mother; Heart disease in her father and mother; Hypertension in her mother; Miscarriages / [place] in her mother.  Allergies  Allergen Reactions   Contrast Media [Iodinated Contrast Media] Hives       PHYSICAL EXAMINATION: Vital signs: BP 114/60 (BP Location: Left Arm, Patient Position: Sitting)   Pulse 64   Ht 5' 2.25" (1.581 m)   Wt 156 lb (70.8 kg)   BMI 28.30 kg/m   Constitutional: Pleasant, pale, chronically ill-appearing, no acute distress Psychiatric: alert and oriented x3, cooperative Eyes:  extraocular movements intact, anicteric, conjunctiva pink Mouth: oral pharynx moist, no lesions Neck: supple no lymphadenopathy Cardiovascular: heart regular rate and rhythm, no murmur Lungs: clear to auscultation bilaterally Abdomen: soft, nontender, nondistended, no obvious ascites, no peritoneal signs, normal bowel sounds, no organomegaly Rectal: Omitted Extremities: no clubbing or cyanosis 2-3+ lower extremity edema bilaterally Skin: no lesions on visible extremities, though chronic stasis changes Neuro: No focal deficits. No asterixis.    ASSESSMENT:   1.  Cirrhosis likely secondary to oxaliplatin induced portal hypertension and possible component of MASH. 2.  Status post TIPS in 2012 for variceal bleeding 3.  Recent hospitalization with hepatic encephalopathy secondary to cellulitis.  Improved.  Currently on lactulose 45 mL 3 times daily 4.  History of colon cancer 2004.  Last colonoscopy 2015 5.  Incidental cholelithiasis     PLAN:   1.  Continue lactulose.  We again discussed proper way to titrate based on symptoms.  If symptomatic, push lactulose to achieve 3-5 bowel movements per day (if exhibiting any evidence of  encephalopathy). 2.  Try to avoid Xifaxan.  Would only be needed if encephalopathic despite maximal lactulose therapy. 3.  No plans for repeat EGD.  Patent TIPS on ultrasound 4.  Ongoing liver specialty care with [facility].  Her next appointment with [facility] is October 15 5.  Recommended compression stockings for her lower extremities, particular during the day 5.  General GI follow-up in this clinic in 1 year.  Resume care with PCP and other specialists A total time of 30 minutes was spent preparing to see the patient (majority of the time), obtaining comprehensive history, performing medically appropriate physical examination, counseling and educating patient and her husband regarding above listed issues, and documenting clinical information in the health record.

## 2023-07-09 NOTE — Patient Instructions (Signed)
Please follow up in one year.  _______________________________________________________  If your blood pressure at your visit was 140/90 or greater, please contact your primary care physician to follow up on this.  _______________________________________________________  If you are age 72 or older, your body mass index should be between 23-30. Your Body mass index is 28.3 kg/m. If this is out of the aforementioned range listed, please consider follow up with your Primary Care Provider.  If you are age 34 or younger, your body mass index should be between 19-25. Your Body mass index is 28.3 kg/m. If this is out of the aformentioned range listed, please consider follow up with your Primary Care Provider.   ________________________________________________________  The [place] [facility] providers would like to encourage you to use [facility] to communicate with providers for non-urgent requests or questions.  Due to long hold times on the telephone, sending your provider a message by [facility] may be a faster and more efficient way to get a response.  Please allow 48 business hours for a response.  Please remember that this is for non-urgent requests.  _______________________________________________________

## 2023-07-12 ENCOUNTER — Ambulatory Visit: Payer: Medicare Other | Admitting: Occupational Therapy

## 2023-07-12 DIAGNOSIS — M6281 Muscle weakness (generalized): Secondary | ICD-10-CM

## 2023-07-12 DIAGNOSIS — R5381 Other malaise: Secondary | ICD-10-CM | POA: Diagnosis not present

## 2023-07-12 DIAGNOSIS — R278 Other lack of coordination: Secondary | ICD-10-CM

## 2023-07-12 DIAGNOSIS — M25632 Stiffness of left wrist, not elsewhere classified: Secondary | ICD-10-CM

## 2023-07-12 DIAGNOSIS — R2681 Unsteadiness on feet: Secondary | ICD-10-CM | POA: Diagnosis not present

## 2023-07-12 DIAGNOSIS — R29898 Other symptoms and signs involving the musculoskeletal system: Secondary | ICD-10-CM | POA: Diagnosis not present

## 2023-07-12 NOTE — Therapy (Unsigned)
OUTPATIENT OCCUPATIONAL THERAPY ORTHO TREATMENT Patient Name: [name] MRN: [id number] DOB:1951/07/28, 72 y.o., female Today's Date: 07/12/2023  PCP: Eden Emms, NP  REFERRING PROVIDER: Marlyne Beards, MD  END OF SESSION:  OT End of Session - 07/12/23 1449     Visit Number 15    Number of Visits 22    Date for OT Re-Evaluation 07/30/23    Authorization Type U[employer] Medicare    Progress Note Due on Visit 20    OT Start Time 1447    OT Stop Time 1530    OT Time Calculation (min) 43 min    Activity Tolerance Patient tolerated treatment well;No increased pain    Behavior During Therapy [facility] for tasks assessed/performed             Past Medical History:  Diagnosis Date   Acute urinary retention 05/04/2022   Allergy 2006 ?   Contrast dye   Arthritis 2016 ??   Knees and thumb   Cancer (HCC)    cecum   Cataract 2021   Surgery scheduled July 2023   Colon cancer [facility]) 2003   Elevated liver function tests    Esophageal varices (HCC)    Heart murmur On file   Hemorrhage of gastrointestinal tract 05/04/2011   Hypertension 2021   Hypothyroidism    Iron deficiency anemia    Liver disease    chemotherapy complication, per pt, shunts placed to bypass liver   Malignant neoplasm of cecum (HCC)    Portal hypertension (HCC)    Skin cancer 2019   Splenomegaly    Past Surgical History:  Procedure Laterality Date   COLON SURGERY  2004   Cancer   COSMETIC SURGERY  2021   Skin cancer   ESOPHAGEAL VARICE LIGATION     EYE SURGERY     HEMICOLECTOMY  01/08/2003   IR RADIOLOGIST EVAL & MGMT  12/20/2020   IR RADIOLOGIST EVAL & MGMT  05/29/2021   LIVER SURGERY     shunts placed after chemo complication   SKIN FULL THICKNESS GRAFT N/A 09/12/2019   Procedure: debridement and FTSG to the nose from left upper arm;  Surgeon: Allena Napoleon, MD;  Location: [facility];  Service: Plastics;  Laterality: N/A;  2 hours, please   TIPS PROCEDURE     Patient Active  Problem List   Diagnosis Date Noted   Acute back pain with sciatica, left 06/09/2023   Bradycardia 05/07/2023   Sepsis due to group B Streptococcus with acute renal failure and septic shock (HCC) 04/24/2023   Septic shock (HCC) 04/21/2023   Adrenal insufficiency (HCC) 03/19/2023   Protein-calorie malnutrition, severe (HCC) 02/26/2023   Acute hepatic encephalopathy (HCC) 02/25/2023   Weight loss 02/24/2023   Bowel habit changes 02/24/2023   Generalized abdominal pain 02/24/2023   Hospital discharge follow-up 02/24/2023   Weakness 01/04/2023   Frequent UTI 05/18/2022   Anemia 05/18/2022   AKI (acute kidney injury) (HCC) 04/30/2022   Tachycardia-bradycardia syndrome (HCC) 02/05/2022   Hepatic encephalopathy (HCC) 02/03/2022   Basal cell carcinoma (BCC) 05/06/2021   Cecal cancer (HCC) 05/06/2021   Hypertension 05/06/2021   COVID-19 vaccination declined 01/30/2021   Decompensated liver cirrhosis with portal HTN and gastric varices 12/04/2020   Essential hypertension 08/12/2020   Murmur, cardiac 03/19/2020   Hypothyroidism 06/30/2019   History of basal cell cancer 06/30/2019   Hx of colon cancer, stage III 11/30/2011   Esophageal varices (HCC) 06/12/2010   Portal hypertension (HCC) 01/23/2008  ONSET DATE: 01/29/2023  REFERRING DIAG: fracture of distal end of radius left  THERAPY DIAG:  Stiffness of left wrist, not elsewhere classified  Muscle weakness (generalized)  Other lack of coordination  Rationale for Evaluation and Treatment: Rehabilitation  SUBJECTIVE:  SUBJECTIVE STATEMENT:  Spouse reports that patient's vital have been improving as well as cognition with patient agreeing she is feeling more like herself.  Spouse reports that patient made cookies and pie last week for his birthday.  She had some difficulty reaching pie plate up in the cabinet however appropriately made a safe decision to await his assistance in getting it out.  Pt accompanied by: significant  other - [name]  PERTINENT HISTORY:  Closed, L distal radius fracture, managing conservatively due to health issues. She is not a candidate for surgery.  Follows up with ortho 06/28/2023. Referred to OPOT for eval and treat.    PRECAUTIONS: Other: to tolerance  - wear brace with strenuous activity  WEIGHT BEARING RESTRICTIONS:  to tolerance LUE  PAIN:  Are you having pain? No - not in hand but her knee was bothering her today d/t rainy weather  FALLS: Has patient fallen in last 6 months? Yes. 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 navy officer and son-in Social worker is Runner, broadcasting/film/video); was driving   PATIENT GOALS: return to [employer]   OBJECTIVE:   HAND DOMINANCE: Right  ADLs: Overall ADLs: mod I  MOBILITY STATUS: Needs Assist: use of cane  ACTIVITY TOLERANCE: Activity tolerance: fair  FUNCTIONAL OUTCOME MEASURES: Quick Dash: 29.5% disability  - See image/specific items on eval 05/12/23  07/12/23: Quick Dash: 31.8% disability - See image/specific items below   UPPER EXTREMITY ROM:     AROM Right (eval) Left (eval) Left 7/3 Left  8/19  Shoulder flexion WNL     Shoulder abduction WNL     Elbow flexion WNL     Elbow extension WNL     Wrist flexion WNL 43* 50* AROM 50* PROM 60*  Wrist extension WNL 39* 38* AROM 50* PROM 60*  Wrist pronation WNL     Wrist supination WNL      Digit Composite Flexion WNL Lacks 4 cm index; 5 cm middle; 4 cm ring; 3.5 cm pinky Lacks 2 cm index; 3 cm middle;  ring and pinky - full Lacks  2mm index; 3 mm long  Digit Composite Extension WNL     Digit Opposition WNL WFL WNL    (Blank rows = not tested)  RUE IR limited to just behind hip  UPPER EXTREMITY MMT:     BUE: WFL shoulders and elbows  HAND FUNCTION: Grip strength: Right: 24.4 lbs; Left: 7.2 lbs 07/12/23: Right 35.7, 31.3, 37.0; Left: 20.2, 20.5, 21.8 lbs Average: Right 34 lbs; Left 20.8 lbs  COORDINATION: 9 Hole Peg test: Right: 28 sec; Left: 38 sec 07/12/23: Left 26.93 sec  SENSATION: WFL  EDEMA: none reported or observed  COGNITION: Overall cognitive status: History of cognitive impairments - at baseline  OBSERVATIONS: Pt appears well-kept. Ambulates with cane. No LOB though slow, altered gait.   TODAY'S TREATMENT:                                                                                                                               -  Therapeutic exercises reviewed practiced and encouraged for carryover to improve L wrist ROM as she is still missing full flexion/extension. Focussed on stretching L wrist - during table top stretch and prayer hand position.  Patient does reports leaning forward on the counter while weightbearing through her hand ie) to look in the mirror etc.  - Therapeutic activities completed for duration as noted below including: Retested various goals for treatment planning with  Good improvement in coordination via 9 hole peg test - goal met (see below) Fair improvement in strength via grip strength - progressing to goal (see below) Limited self-perceived improvements in UE function via QuickDash (perhaps due to increased awareness of deficits and comparing herself to spouse)  PATIENT EDUCATION: Education details: LUE stretching Person educated: Patient and Spouse Education method: Explanation, Demonstration, Tactile cues, and Verbal cues Education comprehension: verbalized understanding, returned demonstration, verbal cues required, tactile cues required, and needs further education  HOME EXERCISE PROGRAM: 05/17/23 - Wrist ROM Access Code: [facility] 05/19/23 -  Coordination Activities Handout with images 05/24/23 - Intro Putty Exercises Access Code: [email or web address] 06/02/2023 - red putty (updated) 06/16/2023: red theraband   GOALS:  SHORT TERM GOALS: Target date: 07/05/2023    Pt will demonstrate full composite flexion of L digits.  Baseline: Lacks 4 cm index; 5 cm middle; 4 cm ring; 3.5 cm Goal status: MET 06/09/23 Distance from palm still lacking for first 2 fingers: 2.5 cm index; 1.5 cm middle; ring and pinkie can touch palm 06/14/23 Distance from palm still lacking for first 2 fingers: 1.5 cm index; 0.5 cm middle; ring and pinkie can touch palm 07/12/23 Distance from palm Index 0.2 cm and long 0.3 cm  2.  Pt will demonstrate L internal rotation to middle of spine to aid with ADL completion. Baseline: just behind hip Goal status: MET   LONG TERM GOALS: Target date: 07/26/2023    Patient will demonstrate LUE HEP with 25% verbal cues or less for proper execution. Baseline:  Goal status: MET  2.  Patient will demonstrate at least 16% improvement with quick Dash score (reporting 13.5% disability or less) indicating improved functional use of affected extremity (opening containers, washing back, carrying objects/bags). Baseline: 29.5% disability with use of LUE Goal status: IN PROGRESS  3.  Pt will demonstrate at least 120* combined L wrist ext/flex AROM.  Baseline: L wrist ext - 39*; flex - 43* Goal status: IN PROGRESS 06/09/23: Wrist ext - 45* flex - 50* 06/14/23: AROM: wrist ext - 50* flex - 50*  PROM: wrist ext/flex ~ 60* each  4.  Patient will demonstrate at least 22 lbs L grip strength as needed to open jars and other containers. Baseline: 7.2 lbs 06/02/2023: 19.8lbs Goal status: REVISED 06/14/23 06/09/23 - still can't open a child proof medicine bottle 07/12/23 - Left: 20.2, 20.5, 21.8 lbs (and opened 3 child proof medicine bottles) Average: Right 34 lbs; Left 20.8 lbs  5.  Patient will complete nine-hole peg with use of L in 35 seconds or  less. Baseline: 38 seconds Goal status: MET 06/14/23 06/09/23: 36:35 seconds  07/12/23: 26.93 seconds   ASSESSMENT:  CLINICAL IMPRESSION: Pt demonstrates continued progression towards established goals and OT frequency is decreased to 1x/week for the next 2 weeks prior to DC.  Patient has made pretty good progress in finger range of motion i.e. fingertips are with an millimeters of touching the palm of her hand upon making a grip.  She has made excellent improvement in coordination with left upper extremity having  exceeded goal.  She nearly squeezed the establish goal to demonstrate improved grip strength and has improved passive range of motion but still lacks active range of motion.  Patient will benefit from continued therapy at decreased frequency to assess ongoing improvement and progress range of motion strength and awareness of functional abilities.  PERFORMANCE DEFICITS: in functional skills including ADLs, IADLs, coordination, ROM, strength, pain, Fine motor control, and UE functional use, cognitive skills including memory and thought  IMPAIRMENTS: are limiting patient from ADLs, IADLs, and work.   CO-MORBIDITIES: may have co-morbidities  that affects occupational performance. Patient will benefit from skilled OT to address above impairments and improve overall function.  REHAB POTENTIAL: Good   PLAN:  OT FREQUENCY: 1-2x/week  OT DURATION: additional 6 weeks  PLANNED INTERVENTIONS: self care/ADL training, therapeutic exercise, therapeutic activity, manual therapy, passive range of motion, splinting, ultrasound, paraffin, fluidotherapy, moist heat, contrast bath, patient/family education, DME and/or AE instructions, and Re-evaluation  RECOMMENDED OTHER SERVICES: none at this time  CONSULTED AND AGREED WITH PLAN OF CARE: Patient and family member/caregiver  PLAN FOR NEXT SESSION:   Decreased frequency from 2x/week to 1x/week with 2 sessions left before planned discharge.  L  hand strengthening and endurance.  Continue functional use of LUE in kitchen/laundry area ie) moving pots/cans etc  Modalities paraffin (potentially for home use) for progressing P/AROM and stretching wrist and digits  QuickDash - reassess at DC   Victorino Sparrow, OT 07/12/2023, 6:10 PM

## 2023-07-14 ENCOUNTER — Ambulatory Visit: Payer: Medicare Other | Admitting: Occupational Therapy

## 2023-07-19 ENCOUNTER — Ambulatory Visit: Payer: Medicare Other | Admitting: Occupational Therapy

## 2023-07-19 DIAGNOSIS — M25632 Stiffness of left wrist, not elsewhere classified: Secondary | ICD-10-CM

## 2023-07-19 DIAGNOSIS — R278 Other lack of coordination: Secondary | ICD-10-CM

## 2023-07-19 DIAGNOSIS — R5381 Other malaise: Secondary | ICD-10-CM | POA: Diagnosis not present

## 2023-07-19 DIAGNOSIS — R29898 Other symptoms and signs involving the musculoskeletal system: Secondary | ICD-10-CM | POA: Diagnosis not present

## 2023-07-19 DIAGNOSIS — M6281 Muscle weakness (generalized): Secondary | ICD-10-CM | POA: Diagnosis not present

## 2023-07-19 DIAGNOSIS — R2681 Unsteadiness on feet: Secondary | ICD-10-CM | POA: Diagnosis not present

## 2023-07-19 NOTE — Patient Instructions (Signed)
Added new ROM/strength for wrist ie) with hammer and combined all exercises (including putty into 1 HEP program) Access Code: [email or web address] URL: [email or web address] Date: 07/19/2023 Prepared by: Amada Kingfisher  NEW Exercises - Seated Forearm Pronation and Supination with Hammer  - 2 x daily - 2 sets - 5-10 reps - Seated Wrist Flexion with Dumbbell  - 2 x daily - 2 sets - 5-10 reps - Seated Wrist Extension with Dumbbell  - 2 x daily - 2 sets - 5-10 reps - Standing Wrist Radial Deviation with Hammer  - 2 x daily - 2 sets - 5-10 reps - Seated Wrist Flexion and Extension with Towel Twist  - 1 x daily - 10 reps  PREVIOUS Exercises included on same access code - Wrist Prayer Stretch  - 2 x daily - 10 reps - Wrist AROM Flexion Extension  - 2 x daily - 10 reps - Seated Finger Composite Flexion Extension  - 2 x daily - 10 reps - Gripping Sponge Neutral  - 2 x daily - 10 reps - Putty Squeezes  - 1 x daily - 2 sets - 10 reps - Rolling Putty on Table  - 1 x daily - 2 sets - 10 reps - 3-Point Pinch with Putty  - 1 x daily - 2 sets - 10 reps - Tip PUSH with Putty  - 1 x daily - 2 sets - 10 reps - Key Pinch with Putty  - 1 x daily - 2 sets - 10 reps - Finger Pinch and Pull with Putty  - 1 x daily - 2 sets - 10 reps - Removing Marbles from Putty  - 1 x daily - 2 sets - 10 reps

## 2023-07-19 NOTE — Therapy (Signed)
OUTPATIENT OCCUPATIONAL THERAPY ORTHO TREATMENT Patient Name: [name] MRN: [id number] DOB:[birth year], 72 y.o., female Today's Date: 07/19/2023  PCP: Eden Emms, NP  REFERRING PROVIDER: Marlyne Beards, MD  END OF SESSION:  OT End of Session - 07/19/23 1450     Visit Number 16    Number of Visits 22    Date for OT Re-Evaluation 07/30/23    Authorization Type [employer] Medicare    Progress Note Due on Visit 20    OT Start Time 1450    OT Stop Time 1530    OT Time Calculation (min) 40 min    Equipment Utilized During Treatment Hammer    Activity Tolerance Patient tolerated treatment well    Behavior During Therapy [facility] for tasks assessed/performed             Past Medical History:  Diagnosis Date   Acute urinary retention 05/04/2022   Allergy 2006 ?   Contrast dye   Arthritis 2016 ??   Knees and thumb   Cancer (HCC)    cecum   Cataract 2021   Surgery scheduled July 2023   Colon cancer [facility]) 2003   Elevated liver function tests    Esophageal varices (HCC)    Heart murmur On file   Hemorrhage of gastrointestinal tract 05/04/2011   Hypertension 2021   Hypothyroidism    Iron deficiency anemia    Liver disease    chemotherapy complication, per pt, shunts placed to bypass liver   Malignant neoplasm of cecum (HCC)    Portal hypertension (HCC)    Skin cancer 2019   Splenomegaly    Past Surgical History:  Procedure Laterality Date   COLON SURGERY  2004   Cancer   COSMETIC SURGERY  2021   Skin cancer   ESOPHAGEAL VARICE LIGATION     EYE SURGERY     HEMICOLECTOMY  01/08/2003   IR RADIOLOGIST EVAL & MGMT  12/20/2020   IR RADIOLOGIST EVAL & MGMT  05/29/2021   LIVER SURGERY     shunts placed after chemo complication   SKIN FULL THICKNESS GRAFT N/A 09/12/2019   Procedure: debridement and FTSG to the nose from left upper arm;  Surgeon: Allena Napoleon, MD;  Location: [facility];  Service: Plastics;  Laterality: N/A;  2 hours, please   TIPS  PROCEDURE     Patient Active Problem List   Diagnosis Date Noted   Acute back pain with sciatica, left 06/09/2023   Bradycardia 05/07/2023   Sepsis due to group B Streptococcus with acute renal failure and septic shock (HCC) 04/24/2023   Septic shock (HCC) 04/21/2023   Adrenal insufficiency (HCC) 03/19/2023   Protein-calorie malnutrition, severe (HCC) 02/26/2023   Acute hepatic encephalopathy (HCC) 02/25/2023   Weight loss 02/24/2023   Bowel habit changes 02/24/2023   Generalized abdominal pain 02/24/2023   Hospital discharge follow-up 02/24/2023   Weakness 01/04/2023   Frequent UTI 05/18/2022   Anemia 05/18/2022   AKI (acute kidney injury) (HCC) 04/30/2022   Tachycardia-bradycardia syndrome (HCC) 02/05/2022   Hepatic encephalopathy (HCC) 02/03/2022   Basal cell carcinoma (BCC) 05/06/2021   Cecal cancer (HCC) 05/06/2021   Hypertension 05/06/2021   COVID-19 vaccination declined 01/30/2021   Decompensated liver cirrhosis with portal HTN and gastric varices 12/04/2020   Essential hypertension 08/12/2020   Murmur, cardiac 03/19/2020   Hypothyroidism 06/30/2019   History of basal cell cancer 06/30/2019   Hx of colon cancer, stage III 11/30/2011   Esophageal varices (HCC) 06/12/2010  Portal hypertension (HCC) 01/23/2008    ONSET DATE: 01/29/2023  REFERRING DIAG: fracture of distal end of radius left  THERAPY DIAG:  Stiffness of left wrist, not elsewhere classified  Muscle weakness (generalized)  Other lack of coordination  Rationale for Evaluation and Treatment: Rehabilitation  SUBJECTIVE:  SUBJECTIVE STATEMENT:  Patient reports she is still working on using her hand in the kitchen etc with spouse confirming she is even working on [employer] bread ie) requiring kneading etc.   Pt accompanied by: significant other - [name]  PERTINENT HISTORY:  Closed, L distal radius fracture, managing conservatively due to health issues. She is not a candidate for surgery.  Follows  up with ortho 06/28/2023. Referred to OPOT for eval and treat.    PRECAUTIONS: Other: to tolerance  - wear brace with strenuous activity  WEIGHT BEARING RESTRICTIONS:  to tolerance LUE  PAIN:  Are you having pain? No - not in hand at rest but occasionally when pressing down on mattress  FALLS: Has patient fallen in last 6 months? Yes. 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 navy officer and son-in Social worker is Runner, broadcasting/film/video); was driving   PATIENT GOALS: return to [employer]   OBJECTIVE:   HAND DOMINANCE: Right  ADLs: Overall ADLs: mod I  MOBILITY STATUS: Needs Assist: use of cane  ACTIVITY TOLERANCE: Activity tolerance: fair  FUNCTIONAL OUTCOME MEASURES: Quick Dash: 29.5% disability  - See image/specific items on eval 05/12/23  07/12/23: Quick Dash: 31.8% disability - See image/specific items below   UPPER EXTREMITY ROM:     AROM Right (eval) Left (eval) Left 7/3 Left  8/19  Shoulder flexion WNL     Shoulder abduction WNL     Elbow flexion WNL     Elbow extension WNL     Wrist flexion WNL 43* 50* AROM 50* PROM 60*  Wrist extension WNL 39* 38* AROM 50* PROM 60*  Wrist pronation WNL     Wrist supination WNL      Digit Composite Flexion WNL Lacks 4 cm index; 5 cm middle; 4 cm ring; 3.5 cm pinky Lacks 2 cm index; 3 cm middle;  ring and pinky - full Lacks  2mm index; 3 mm long  Digit Composite Extension WNL     Digit Opposition WNL WFL WNL   (Blank rows = not tested)  RUE IR limited to just behind hip  UPPER EXTREMITY MMT:     BUE: WFL shoulders and elbows  HAND FUNCTION: Grip strength:  Right: 24.4 lbs; Left: 7.2 lbs 07/12/23: Right 35.7, 31.3, 37.0; Left: 20.2, 20.5, 21.8 lbs Average: Right 34 lbs; Left 20.8 lbs  COORDINATION: 9 Hole Peg test: Right: 28 sec; Left: 38 sec 07/12/23: Left 26.93 sec  SENSATION: WFL  EDEMA: none reported or observed  COGNITION: Overall cognitive status: History of cognitive impairments - at baseline  OBSERVATIONS: Pt appears well-kept. Ambulates with cane. No LOB though slow, altered gait.   TODAY'S TREATMENT:                                                                                                                               -  Therapeutic exercises reviewed practiced and encouraged for carryover to improve L wrist ROM as she is still missing full flexion/extension and max strength. Focussed on AROM, strength and stability of L wrist - during hammer control exercises.  Patient also engaged in weightbearing activities leaning forward on therapy web over a bowl with demonstration of how to use a t-shirt or towel over a bowl or a soft ball on the table top to simulate weightbearing on the bed which is when she has the most discomfort in using her hand lately.  NEW Exercises demonstrated, practiced and provided via handouts included the following:  - Seated Forearm Pronation and Supination with Hammer  - 2 x daily - 2 sets - 5-10 reps - Seated Wrist Flexion and Extension with Dumbbell (or hammer for increased challenge)  - 2 x daily - 2 sets - 5-10 reps - Standing Wrist Radial/Ulnar Deviation with Hammer  - 2 x daily - 2 sets - 5-10 reps - Seated Wrist Flexion and Extension with Towel Twist  - 1 x daily - 10 reps Patient is assisted to increases/decrease challenge based on where she held the hammer and is encouraged to mark her hammer at home to see how she can move down the handle for increased strength, control and ROM.  PATIENT EDUCATION: Education details: LUE strength with Psychologist, clinical Person educated: Patient and Spouse Education  method: Explanation, Demonstration, Tactile cues, and Verbal cues Education comprehension: verbalized understanding, returned demonstration, verbal cues required, tactile cues required, and needs further education  HOME EXERCISE PROGRAM: 05/17/23 - Wrist ROM Access Code: [facility] 05/19/23 - Coordination Activities Handout with images 05/24/23 - Intro Putty Exercises Access Code: [email or web address] 06/02/2023 - red putty (updated) 06/16/2023: red theraband 07/19/23 - Hammer exercises same Access Code: [email or web address] (putty exercises also added for ease of online access of ALL exercises).   GOALS:  SHORT TERM GOALS: Target date: 07/05/2023    Pt will demonstrate full composite flexion of L digits.  Baseline: Lacks 4 cm index; 5 cm middle; 4 cm ring; 3.5 cm Goal status: MET 06/09/23 Distance from palm still lacking for first 2 fingers: 2.5 cm index; 1.5 cm middle; ring and pinkie can touch palm 06/14/23 Distance from palm still lacking for first 2 fingers: 1.5 cm index; 0.5 cm middle; ring and pinkie can touch palm 07/12/23 Distance from palm Index 0.2 cm and long 0.3 cm  2.  Pt will demonstrate L internal rotation to middle of spine to aid with ADL completion. Baseline: just behind hip Goal status: MET   LONG TERM GOALS: Target date: 07/26/2023    Patient will demonstrate LUE HEP with 25% verbal cues or less for proper execution. Baseline:  Goal status: MET  2.  Patient will demonstrate at least 16% improvement with quick Dash score (reporting 13.5% disability or less) indicating improved functional use of affected extremity (opening containers, washing back, carrying objects/bags). Baseline: 29.5% disability with use of LUE Goal status: IN PROGRESS  3.  Pt will demonstrate at least 120* combined L wrist ext/flex AROM.  Baseline: L wrist ext - 39*; flex - 43* Goal status: IN PROGRESS 06/09/23: Wrist ext - 45* flex - 50* 06/14/23: AROM: wrist ext - 50* flex - 50*  PROM: wrist ext/flex ~ 60* each  4.   Patient will demonstrate at least 22 lbs L grip strength as needed to open jars and other containers. Baseline: 7.2 lbs 06/02/2023: 19.8lbs Goal status: REVISED 06/14/23 06/09/23 - still can't open a child proof medicine bottle 07/12/23 -  Left: 20.2, 20.5, 21.8 lbs (and opened 3 child proof medicine bottles) Average: Right 34 lbs; Left 20.8 lbs  5.  Patient will complete nine-hole peg with use of L in 35 seconds or less. Baseline: 38 seconds Goal status: MET 06/14/23 06/09/23: 36:35 seconds  07/12/23: 26.93 seconds   ASSESSMENT:  CLINICAL IMPRESSION: Pt responded well to the challenge of hammer ROM/strength activities today to improve active range of motion, wrist stability and overall L UE strength.  Patient is expected to be discharged from skilled OT next week with appropriate HEP in place. PERFORMANCE DEFICITS: in functional skills including ADLs, IADLs, coordination, ROM, strength, pain, Fine motor control, and UE functional use, cognitive skills including memory and thought  IMPAIRMENTS: are limiting patient from ADLs, IADLs, and work.   CO-MORBIDITIES: may have co-morbidities  that affects occupational performance. Patient will benefit from skilled OT to address above impairments and improve overall function.  REHAB POTENTIAL: Good   PLAN:  OT FREQUENCY: 1-2x/week  OT DURATION: additional 6 weeks  PLANNED INTERVENTIONS: self care/ADL training, therapeutic exercise, therapeutic activity, manual therapy, passive range of motion, splinting, ultrasound, paraffin, fluidotherapy, moist heat, contrast bath, patient/family education, DME and/or AE instructions, and Re-evaluation  RECOMMENDED OTHER SERVICES: none at this time  CONSULTED AND AGREED WITH PLAN OF CARE: Patient and family member/caregiver  PLAN FOR NEXT SESSION:   Discharge -- QuickDash - reassess for DC  Review L hand strengthening and endurance activities with hammer as presented past session.  Consider education  re: home modality use such as paraffin for progressing P/AROM and stretching wrist and digits   Victorino Sparrow, OT 07/19/2023, 5:20 PM

## 2023-07-21 ENCOUNTER — Encounter: Payer: Self-pay | Admitting: Occupational Therapy

## 2023-07-28 ENCOUNTER — Ambulatory Visit: Payer: Medicare Other | Attending: Nurse Practitioner | Admitting: Occupational Therapy

## 2023-07-28 DIAGNOSIS — M25632 Stiffness of left wrist, not elsewhere classified: Secondary | ICD-10-CM | POA: Diagnosis not present

## 2023-07-28 DIAGNOSIS — K7469 Other cirrhosis of liver: Secondary | ICD-10-CM | POA: Diagnosis not present

## 2023-07-28 DIAGNOSIS — M6281 Muscle weakness (generalized): Secondary | ICD-10-CM | POA: Diagnosis not present

## 2023-07-28 DIAGNOSIS — R600 Localized edema: Secondary | ICD-10-CM | POA: Diagnosis not present

## 2023-07-28 DIAGNOSIS — R5381 Other malaise: Secondary | ICD-10-CM | POA: Insufficient documentation

## 2023-07-28 DIAGNOSIS — R278 Other lack of coordination: Secondary | ICD-10-CM | POA: Insufficient documentation

## 2023-07-28 NOTE — Patient Instructions (Signed)
(  Exercise) Monday Tuesday Wednesday Thursday Friday Saturday Sunday   Putty           Coordination           [employer]

## 2023-07-28 NOTE — Therapy (Unsigned)
OUTPATIENT OCCUPATIONAL THERAPY ORTHO TREATMENT & DISCHARGE SUMMARY Patient Name: [name] MRN: [id number] DOB:10-15-1951, 72 y.o., female Today's Date: 07/28/2023  PCP: Eden Emms, NP  REFERRING PROVIDER: Marlyne Beards, MD  END OF SESSION:  OT End of Session - 07/28/23 1448     Visit Number 17    Number of Visits 22    Date for OT Re-Evaluation 07/30/23    Authorization Type [employer] Medicare    Progress Note Due on Visit 20    OT Start Time 1446    OT Stop Time 1530    OT Time Calculation (min) 44 min    Equipment Utilized During Treatment Theraband    Activity Tolerance Patient tolerated treatment well    Behavior During Therapy [facility] for tasks assessed/performed             Past Medical History:  Diagnosis Date   Acute urinary retention 05/04/2022   Allergy 2006 ?   Contrast dye   Arthritis 2016 ??   Knees and thumb   Cancer (HCC)    cecum   Cataract 2021   Surgery scheduled July 2023   Colon cancer [facility]) 2003   Elevated liver function tests    Esophageal varices (HCC)    Heart murmur On file   Hemorrhage of gastrointestinal tract 05/04/2011   Hypertension 2021   Hypothyroidism    Iron deficiency anemia    Liver disease    chemotherapy complication, per pt, shunts placed to bypass liver   Malignant neoplasm of cecum (HCC)    Portal hypertension (HCC)    Skin cancer 2019   Splenomegaly    Past Surgical History:  Procedure Laterality Date   COLON SURGERY  2004   Cancer   COSMETIC SURGERY  2021   Skin cancer   ESOPHAGEAL VARICE LIGATION     EYE SURGERY     HEMICOLECTOMY  01/08/2003   IR RADIOLOGIST EVAL & MGMT  12/20/2020   IR RADIOLOGIST EVAL & MGMT  05/29/2021   LIVER SURGERY     shunts placed after chemo complication   SKIN FULL THICKNESS GRAFT N/A 09/12/2019   Procedure: debridement and FTSG to the nose from left upper arm;  Surgeon: Allena Napoleon, MD;  Location: [facility];  Service: Plastics;  Laterality: N/A;  2  hours, please   TIPS PROCEDURE     Patient Active Problem List   Diagnosis Date Noted   Acute back pain with sciatica, left 06/09/2023   Bradycardia 05/07/2023   Sepsis due to group B Streptococcus with acute renal failure and septic shock (HCC) 04/24/2023   Septic shock (HCC) 04/21/2023   Adrenal insufficiency (HCC) 03/19/2023   Protein-calorie malnutrition, severe (HCC) 02/26/2023   Acute hepatic encephalopathy (HCC) 02/25/2023   Weight loss 02/24/2023   Bowel habit changes 02/24/2023   Generalized abdominal pain 02/24/2023   Hospital discharge follow-up 02/24/2023   Weakness 01/04/2023   Frequent UTI 05/18/2022   Anemia 05/18/2022   AKI (acute kidney injury) (HCC) 04/30/2022   Tachycardia-bradycardia syndrome (HCC) 02/05/2022   Hepatic encephalopathy (HCC) 02/03/2022   Basal cell carcinoma (BCC) 05/06/2021   Cecal cancer (HCC) 05/06/2021   Hypertension 05/06/2021   COVID-19 vaccination declined 01/30/2021   Decompensated liver cirrhosis with portal HTN and gastric varices 12/04/2020   Essential hypertension 08/12/2020   Murmur, cardiac 03/19/2020   Hypothyroidism 06/30/2019   History of basal cell cancer 06/30/2019   Hx of colon cancer, stage III 11/30/2011   Esophageal varices (  HCC) 06/12/2010   Portal hypertension (HCC) 01/23/2008    ONSET DATE: 01/29/2023  REFERRING DIAG: fracture of distal end of radius left  THERAPY DIAG:  Stiffness of left wrist, not elsewhere classified  Muscle weakness (generalized)  Other lack of coordination  Physical deconditioning  Rationale for Evaluation and Treatment: Rehabilitation  SUBJECTIVE:  SUBJECTIVE STATEMENT:  Patient reports a busy week - shopping, cooking (made meatloaf, and chicken casserole).  Pt accompanied by: significant other - [name]  PERTINENT HISTORY:  Closed, L distal radius fracture, managing conservatively due to health issues. She is not a candidate for surgery.  Follows up with ortho 06/28/2023. Referred  to OPOT for eval and treat.    PRECAUTIONS: Other: to tolerance  - wear brace with strenuous activity  WEIGHT BEARING RESTRICTIONS:  to tolerance LUE  PAIN:  Are you having pain? No - not in hand at rest but occasionally when pressing down on mattress  FALLS: Has patient fallen in last 6 months? Yes. 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 navy officer and son-in Social worker is Runner, broadcasting/film/video); was driving   PATIENT GOALS: return to [employer]   OBJECTIVE:   HAND DOMINANCE: Right  ADLs: Overall ADLs: mod I  MOBILITY STATUS: Needs Assist: use of cane  ACTIVITY TOLERANCE: Activity tolerance: fair  FUNCTIONAL OUTCOME MEASURES: Quick Dash: 29.5% disability  - See image/specific items on eval 05/12/23  07/12/23: Quick Dash: 31.8% disability - See image/specific items below  07/28/23: Quick Dash: 13.6 %    UPPER EXTREMITY ROM:     AROM Right (eval) Left (eval) Left 7/3 Left  8/19  Shoulder flexion WNL     Shoulder abduction WNL     Elbow flexion WNL     Elbow extension WNL     Wrist flexion WNL 43* 50* AROM 50* PROM 60*  Wrist extension WNL 39* 38* AROM 50* PROM 60*  Wrist pronation WNL     Wrist supination WNL      Digit Composite Flexion WNL Lacks 4 cm index; 5 cm middle; 4 cm ring; 3.5 cm pinky Lacks 2 cm index; 3 cm middle;  ring and pinky - full Lacks  2mm index; 3 mm long  Digit Composite Extension WNL     Digit Opposition WNL WFL WNL   (Blank rows = not tested)  RUE IR limited to just behind hip  UPPER EXTREMITY MMT:     BUE: WFL shoulders and elbows  HAND FUNCTION: Grip strength: Right:  24.4 lbs; Left: 7.2 lbs 07/12/23: Right 35.7, 31.3, 37.0; Left: 20.2, 20.5, 21.8 lbs Average: Right 34 lbs; Left 20.8 lbs  COORDINATION: 9 Hole Peg test: Right: 28 sec; Left: 38 sec 07/12/23: Left 26.93 sec  SENSATION: WFL  EDEMA: none reported or observed  COGNITION: Overall cognitive status: History of cognitive impairments - at baseline  OBSERVATIONS: Pt appears well-kept. Ambulates with cane. No LOB though slow, altered gait.   TODAY'S TREATMENT:                                                                                                                               -  Therapeutic exercises reviewed practiced and encouraged for carryover to improve L wrist ROM as she is still missing full flexion/extension and max strength. Focussed on AROM, strength and stability of L wrist - during hammer control exercises.  Patient also engaged in weightbearing activities leaning forward on therapy web over a bowl with demonstration of how to use a t-shirt or towel over a bowl or a soft ball on the table top to simulate weightbearing on the bed which is when she has the most discomfort in using her hand lately.  NEW Exercises demonstrated, practiced and provided via handouts included the following:  - Seated Forearm Pronation and Supination with Hammer  - 2 x daily - 2 sets - 5-10 reps - Seated Wrist Flexion and Extension with Dumbbell (or hammer for increased challenge)  - 2 x daily - 2 sets - 5-10 reps - Standing Wrist Radial/Ulnar Deviation with Hammer  - 2 x daily - 2 sets - 5-10 reps - Seated Wrist Flexion and Extension with Towel Twist  - 1 x daily - 10 reps Patient is assisted to increases/decrease challenge based on where she held the hammer and is encouraged to mark her hammer at home to see how she can move down the handle for increased strength, control and ROM.  PATIENT EDUCATION: Education details: LUE strength with Psychologist, clinical Person educated: Patient and Spouse Education method:  Explanation, Demonstration, Tactile cues, and Verbal cues Education comprehension: verbalized understanding, returned demonstration, verbal cues required, tactile cues required, and needs further education  HOME EXERCISE PROGRAM: 05/17/23 - Wrist ROM Access Code: [facility] 05/19/23 - Coordination Activities Handout with images 05/24/23 - Intro Putty Exercises Access Code: [email or web address] 06/02/2023 - red putty (updated) 06/16/2023: red theraband 07/19/23 - Hammer exercises same Access Code: [email or web address] (putty exercises also added for ease of online access of ALL exercises).   GOALS:  SHORT TERM GOALS: Target date: 07/05/2023    Pt will demonstrate full composite flexion of L digits.  Baseline: Lacks 4 cm index; 5 cm middle; 4 cm ring; 3.5 cm Goal status: MET 06/09/23 Distance from palm still lacking for first 2 fingers: 2.5 cm index; 1.5 cm middle; ring and pinkie can touch palm 06/14/23 Distance from palm still lacking for first 2 fingers: 1.5 cm index; 0.5 cm middle; ring and pinkie can touch palm 07/12/23 Distance from palm Index 0.2 cm and long 0.3 cm 07/28/23 - able to touch all fingertips to palm of hand  2.  Pt will demonstrate L internal rotation to middle of spine to aid with ADL completion. Baseline: just behind hip Goal status: MET   LONG TERM GOALS: Target date: 07/26/2023    Patient will demonstrate LUE HEP with 25% verbal cues or less for proper execution. Baseline:  Goal status: MET  2.  Patient will demonstrate at least 16% improvement with quick Dash score (reporting 13.5% disability or less) indicating improved functional use of affected extremity (opening containers, washing back, carrying objects/bags). Baseline: 29.5% disability with use of LUE Goal status: IN PROGRESS  3.  Pt will demonstrate at least 120* combined L wrist ext/flex AROM.  Baseline: L wrist ext - 39*; flex - 43* Goal status: IN PROGRESS 06/09/23: Wrist ext - 45* flex - 50* 06/14/23: AROM: wrist ext - 50* flex -  50*  PROM: wrist ext/flex ~ 60* each 07/28/23 AROM wrist ext 55* wrist flex 55*  PROM: wrist ext/flex ~ 60* each  4.  Patient will demonstrate at least 22 lbs L grip strength as needed  to open jars and other containers. Baseline: 7.2 lbs 06/02/2023: 19.8lbs Goal status: REVISED 06/14/23 06/09/23 - still can't open a child proof medicine bottle 07/12/23 - Left: 20.2, 20.5, 21.8 lbs (and opened 3 child proof medicine bottles) Average: Right 34 lbs; Left 20.8 lbs 07/28/23 - Left 23.5, 23.8, 22.2  5.  Patient will complete nine-hole peg with use of L in 35 seconds or less. Baseline: 38 seconds Goal status: MET 06/14/23 06/09/23: 36:35 seconds  07/12/23: 26.93 seconds   ASSESSMENT:  CLINICAL IMPRESSION: Pt responded well to the challenge of hammer ROM/strength activities today to improve active range of motion, wrist stability and overall L UE strength.  Patient is expected to be discharged from skilled OT next week with appropriate HEP in place. PERFORMANCE DEFICITS: in functional skills including ADLs, IADLs, coordination, ROM, strength, pain, Fine motor control, and UE functional use, cognitive skills including memory and thought  IMPAIRMENTS: are limiting patient from ADLs, IADLs, and work.   CO-MORBIDITIES: may have co-morbidities  that affects occupational performance. Patient will benefit from skilled OT to address above impairments and improve overall function.  REHAB POTENTIAL: Good   PLAN:  OT FREQUENCY: 1-2x/week  OT DURATION: additional 6 weeks  PLANNED INTERVENTIONS: self care/ADL training, therapeutic exercise, therapeutic activity, manual therapy, passive range of motion, splinting, ultrasound, paraffin, fluidotherapy, moist heat, contrast bath, patient/family education, DME and/or AE instructions, and Re-evaluation  RECOMMENDED OTHER SERVICES: none at this time  CONSULTED AND AGREED WITH PLAN OF CARE: Patient and family member/caregiver  PLAN FOR NEXT SESSION:    Discharge -- QuickDash - reassess for DC  Review L hand strengthening and endurance activities with hammer as presented past session.  Consider education re: home modality use such as paraffin for progressing P/AROM and stretching wrist and digits   Victorino Sparrow, OT 07/28/2023, 5:21 PM

## 2023-08-03 ENCOUNTER — Telehealth: Payer: Self-pay

## 2023-08-03 ENCOUNTER — Telehealth: Payer: Self-pay | Admitting: Nurse Practitioner

## 2023-08-03 NOTE — Telephone Encounter (Signed)
Called husband back. Verified she is taking one a day for last 3 weeks. Patient has put message to Endo about questions regarding medication changes but he is not in the office. Patient was just in hospital for infection. He states that she is showing signs of infections. He wanted to have labs done advised she would need to be seen. I have set up patient for visit with [name] tomorrow morning. Advised we would likely need urine sample. I have reviewed red word with husband and he repeated back to me if any will take patient to ED.

## 2023-08-03 NOTE — Telephone Encounter (Signed)
Called and spoke to patient husband (on dpr). Patient was taken off Lasix and told to take as prn only. Due to swelling in legs patient husband started back on daily. While trying to get information call from another office and call was disconnected.

## 2023-08-03 NOTE — Telephone Encounter (Signed)
Pt's husband, [name], called stating the pt hasn't been feeling herself & recently started back taking furosemide (LASIX) 20 MG tablet, after being taken off it by Robin Searing, NP. [name] stated due the pt's swelling, Robin Searing put the pt back on meds. [name] states the pt's temp has been ranging from 95.1 to 96 degrees & still has some leg swelling. [name] states the pt also has blisters. [name] wanted to ask Toney Reil does he think the pt needs blook work or is the pt fine? Call back # [phone]

## 2023-08-03 NOTE — Telephone Encounter (Signed)
[name] is calling today on behalf of his wife [name] today , [name] is stating that [name] is still on one dose of Hydrocortison 5mg  in the morning, her legs has started to swell they are asking if she should  inscrease her lasix, she is currently taking 1 20 mg tablet in the mornings or asking can she go ahead and stop that 1 hydrocortison  5mg  in the mornings, Please advise on behalf of Dr Roosevelt Locks

## 2023-08-03 NOTE — Telephone Encounter (Signed)
Agree with the patient being seen in office. Appreciate tabithas evaluation

## 2023-08-03 NOTE — Telephone Encounter (Signed)
With a review of the chart I see that patient is being worked up for adrenal insufficiency.  At this time if she is only taking hydrocortisone 5 mg daily in the morning, I would not expect to see a small dose of hydrocortisone would cause significant leg swelling.  In regard to taking extra dose of Lasix for the increased leg swelling, I would recommend to talk with her primary care provider.  In regard to question of hydrocortisone I would not recommend stopping hydrocortisone altogether until workup for the adrenal insufficiency is complete.  This is something patient would require follow-up with Dr. Roosevelt Locks and can be decided at that time.  Katherine Jameeka Marcy, MD [facility] Endocrinology [facility] [address] [place], [address] [place], [place] [address] Phone # [phone]

## 2023-08-04 ENCOUNTER — Encounter: Payer: Self-pay | Admitting: Family

## 2023-08-04 ENCOUNTER — Ambulatory Visit (INDEPENDENT_AMBULATORY_CARE_PROVIDER_SITE_OTHER): Payer: Medicare Other | Admitting: Family

## 2023-08-04 VITALS — BP 126/78 | HR 67 | Temp 98.2°F | Ht 62.25 in | Wt 160.0 lb

## 2023-08-04 DIAGNOSIS — R4182 Altered mental status, unspecified: Secondary | ICD-10-CM | POA: Diagnosis not present

## 2023-08-04 DIAGNOSIS — L03116 Cellulitis of left lower limb: Secondary | ICD-10-CM | POA: Diagnosis not present

## 2023-08-04 DIAGNOSIS — K7460 Unspecified cirrhosis of liver: Secondary | ICD-10-CM

## 2023-08-04 DIAGNOSIS — Z8619 Personal history of other infectious and parasitic diseases: Secondary | ICD-10-CM | POA: Diagnosis not present

## 2023-08-04 DIAGNOSIS — L03115 Cellulitis of right lower limb: Secondary | ICD-10-CM

## 2023-08-04 LAB — CBC WITH DIFFERENTIAL/PLATELET
Basophils Absolute: 0 10*3/uL (ref 0.0–0.1)
Basophils Relative: 0.7 % (ref 0.0–3.0)
Eosinophils Absolute: 0.1 10*3/uL (ref 0.0–0.7)
Eosinophils Relative: 2.9 % (ref 0.0–5.0)
HCT: 32.8 % — ABNORMAL LOW (ref 36.0–46.0)
Hemoglobin: 10.8 g/dL — ABNORMAL LOW (ref 12.0–15.0)
Lymphocytes Relative: 24.3 % (ref 12.0–46.0)
Lymphs Abs: 0.7 10*3/uL (ref 0.7–4.0)
MCHC: 32.9 g/dL (ref 30.0–36.0)
MCV: 102.9 fl — ABNORMAL HIGH (ref 78.0–100.0)
Monocytes Absolute: 0.3 10*3/uL (ref 0.1–1.0)
Monocytes Relative: 9 % (ref 3.0–12.0)
Neutro Abs: 1.8 10*3/uL (ref 1.4–7.7)
Neutrophils Relative %: 63.1 % (ref 43.0–77.0)
Platelets: 73 10*3/uL — ABNORMAL LOW (ref 150.0–400.0)
RBC: 3.19 Mil/uL — ABNORMAL LOW (ref 3.87–5.11)
RDW: 17.9 % — ABNORMAL HIGH (ref 11.5–15.5)
WBC: 2.8 10*3/uL — ABNORMAL LOW (ref 4.0–10.5)

## 2023-08-04 LAB — URINALYSIS, ROUTINE W REFLEX MICROSCOPIC
Bilirubin Urine: NEGATIVE
Hgb urine dipstick: NEGATIVE
Ketones, ur: NEGATIVE
Leukocytes,Ua: NEGATIVE
Nitrite: NEGATIVE
RBC / HPF: NONE SEEN (ref 0–?)
Specific Gravity, Urine: 1.01 (ref 1.000–1.030)
Total Protein, Urine: NEGATIVE
Urine Glucose: NEGATIVE
Urobilinogen, UA: 0.2 (ref 0.0–1.0)
pH: 6.5 (ref 5.0–8.0)

## 2023-08-04 LAB — COMPREHENSIVE METABOLIC PANEL
ALT: 50 U/L — ABNORMAL HIGH (ref 0–35)
AST: 74 U/L — ABNORMAL HIGH (ref 0–37)
Albumin: 3.2 g/dL — ABNORMAL LOW (ref 3.5–5.2)
Alkaline Phosphatase: 170 U/L — ABNORMAL HIGH (ref 39–117)
BUN: 23 mg/dL (ref 6–23)
CO2: 30 meq/L (ref 19–32)
Calcium: 9.6 mg/dL (ref 8.4–10.5)
Chloride: 105 meq/L (ref 96–112)
Creatinine, Ser: 0.83 mg/dL (ref 0.40–1.20)
GFR: 70.55 mL/min (ref >60.00)
Glucose, Bld: 55 mg/dL — ABNORMAL LOW (ref 70–99)
Potassium: 4.9 meq/L (ref 3.5–5.1)
Sodium: 140 meq/L (ref 135–145)
Total Bilirubin: 1.2 mg/dL (ref 0.2–1.2)
Total Protein: 6.5 g/dL (ref 6.0–8.3)

## 2023-08-04 LAB — AMMONIA: Ammonia: 43 umol/L — ABNORMAL HIGH (ref 11–35)

## 2023-08-04 MED ORDER — CEPHALEXIN 500 MG PO CAPS
500.0000 mg | ORAL_CAPSULE | Freq: Four times a day (QID) | ORAL | 0 refills | Status: DC
Start: 2023-08-04 — End: 2023-08-09

## 2023-08-04 NOTE — Assessment & Plan Note (Signed)
Mild however reported slight changes per husband.  Ordering ammonia, cbc cmp pending results. Also ordering UA to r/o UTI Ddx rule out infection , elevated ammonia levels

## 2023-08-04 NOTE — Telephone Encounter (Signed)
I spoke to Mr Blazevic and he states that she saw her PCP this morning and everyone is in agreeance regarding medication and recommendation and they will wait to follow up with Dr Roosevelt Locks

## 2023-08-04 NOTE — Progress Notes (Signed)
Can we please add on urine culture, order in.

## 2023-08-04 NOTE — Assessment & Plan Note (Signed)
Worsening edema,  Reached out to hepatology to consider increase in lasix, awaiting call back, goal is to increase to 40 mg once daily.

## 2023-08-04 NOTE — Progress Notes (Signed)
Established Patient Office Visit  Subjective:      CC:  Chief Complaint  Patient presents with   Leg Swelling    HPI: [name] is a 72 y.o. female presenting on 08/04/2023 for Leg Swelling . Pt with complaints of lower extremity swelling,  this has been ongoing and managed by her hepatologist as well as   Hepatic encephalopathy, was inpatient and was hospitalized for cellulitis. She has been since seen 8/16 for f/u with gastro Yancey Flemings.   Was recently seen by cardiologist who stated to continue with lasix on as prn basis. She had great improvement with a brief course of lasix, and then went off but then noticed that again her legs were swelling so they started her again on furosemide, but then husband noticed that her furosemide was an old prescription, over one year and there was no improvement with her edema. She then called hepatologist, Dr. Gean Maidens, labs were drawn and a new new RX for furosemide was sent (this was about three days ago)   For cirrhosis she is currently taking lasix 20 mg once daily, spironolactone 25 mg once daily as well as lactulose 10/gm 15 ml   He does state that she is noticing blisters on her lower extremities that she is having pop. She has some redness in her bilateral lower extremities. He states that she asked if someone was at the door, husband states may have been due to a hard sleep and dreaming which he states is not particularly abnormal for her. He states again later while watching the TV she could not recall a word which is abnormal for her. He denies fever but he does state yesterday she had temp 95 / 96 which is anb for her. He states that when she is 'sick' she goes lower with temp. Today is 98.2  Wt Readings from Last 3 Encounters:  08/04/23 160 lb (72.6 kg)  07/09/23 156 lb (70.8 kg)  07/02/23 150 lb 9.6 oz (68.3 kg)   Wt Readings from Last 3 Encounters:  08/04/23 160 lb (72.6 kg)  07/09/23 156 lb (70.8 kg)  07/02/23 150 lb 9.6  oz (68.3 kg)   Temp Readings from Last 3 Encounters:  08/04/23 98.2 F (36.8 C) (Temporal)  06/09/23 98.2 F (36.8 C) (Temporal)  05/07/23 97.8 F (36.6 C)   BP Readings from Last 3 Encounters:  08/04/23 126/78  07/09/23 114/60  07/02/23 (!) 92/51   Pulse Readings from Last 3 Encounters:  08/04/23 67  07/09/23 64  07/02/23 68     Last metabolic panel Lab Results  Component Value Date   GLUCOSE 55 (L) 08/04/2023   NA 140 08/04/2023   K 4.9 08/04/2023   CL 105 08/04/2023   CO2 30 08/04/2023   BUN 23 08/04/2023   CREATININE 0.83 08/04/2023   GFR 70.55 08/04/2023   CALCIUM 9.6 08/04/2023   PHOS 2.8 04/26/2023   PROT 6.5 08/04/2023   ALBUMIN 3.2 (L) 08/04/2023   LABGLOB 2.6 03/19/2023   AGRATIO 1.5 03/19/2023   BILITOT 1.2 08/04/2023   ALKPHOS 170 (H) 08/04/2023   AST 74 (H) 08/04/2023   ALT 50 (H) 08/04/2023   ANIONGAP 6 04/27/2023     New complaints: Husband states that mentally she is doing well, she is 'her' again however he does state a few things happened yesterday that were concerning.      Social history:  Relevant past medical, surgical, family and social history reviewed and updated as indicated.  Interim medical history since our last visit reviewed.  Allergies and medications reviewed and updated.  DATA REVIEWED: CHART IN EPIC     ROS: Negative unless specifically indicated above in HPI.    Current Outpatient Medications:    acidophilus (RISAQUAD) CAPS capsule, Take 1 capsule by mouth daily., Disp: , Rfl:    cephALEXin (KEFLEX) 500 MG capsule, Take 1 capsule (500 mg total) by mouth 4 (four) times daily for 7 days., Disp: 28 capsule, Rfl: 0   CRANBERRY PO, Take 1 tablet by mouth 2 (two) times daily., Disp: , Rfl:    estradiol (ESTRACE) 0.1 MG/GM vaginal cream, Place 1 Applicatorful vaginally every other day., Disp: , Rfl:    furosemide (LASIX) 20 MG tablet, Take 1 tablet (20 mg total) by mouth daily as needed., Disp: , Rfl:     hydrocortisone (CORTEF) 5 MG tablet, Take 1 tablet (5 mg total) by mouth 3 (three) times daily., Disp: 90 tablet, Rfl: 1   lactulose (CHRONULAC) 10 GM/15ML solution, Take 45 mLs (30 g total) by mouth 3 (three) times daily., Disp: 236 mL, Rfl: 0   levOCARNitine (CARNITOR) 330 MG tablet, Take 330 mg by mouth 3 (three) times daily., Disp: , Rfl:    liver oil-zinc oxide (DESITIN) 40 % ointment, Apply topically daily as needed for irritation. (Patient taking differently: Apply 1 Application topically daily as needed for irritation.), Disp: 56.7 g, Rfl: 0   Multiple Vitamin (MULTI-VITAMIN) tablet, Take 1 tablet by mouth daily., Disp: , Rfl:    polyethylene glycol (MIRALAX / GLYCOLAX) 17 g packet, Take 17 g by mouth daily as needed for mild constipation., Disp: 14 each, Rfl: 0   SYNTHROID 75 MCG tablet, Take 1 tablet (75 mcg total) by mouth daily before breakfast. Must be seen in office for more refills., Disp: 90 tablet, Rfl: 1   spironolactone (ALDACTONE) 25 MG tablet, Take 1 tablet (25 mg total) by mouth daily., Disp: 30 tablet, Rfl: 2      Objective:    BP 126/78 (BP Location: Left Arm, Patient Position: Sitting, Cuff Size: Normal)   Pulse 67   Temp 98.2 F (36.8 C) (Temporal)   Ht 5' 2.25" (1.581 m)   Wt 160 lb (72.6 kg)   SpO2 94%   BMI 29.03 kg/m   Wt Readings from Last 3 Encounters:  08/04/23 160 lb (72.6 kg)  07/09/23 156 lb (70.8 kg)  07/02/23 150 lb 9.6 oz (68.3 kg)    Physical Exam Constitutional:      General: She is not in acute distress.    Appearance: Normal appearance. She is normal weight. She is not ill-appearing, toxic-appearing or diaphoretic.  HENT:     Head: Normocephalic.  Cardiovascular:     Rate and Rhythm: Normal rate and regular rhythm.     Heart sounds: Murmur heard.     Systolic murmur is present.     Comments: Slight erythema bil lower extremities with two small papular lesions anterior shin  Pulmonary:     Effort: Pulmonary effort is normal.      Breath sounds: Normal breath sounds.  Musculoskeletal:        General: Normal range of motion.     Right lower leg: 3+ Edema present.     Left lower leg: 3+ Edema present.  Neurological:     General: No focal deficit present.     Mental Status: She is alert and oriented to person, place, and time. Mental status is at baseline.  Psychiatric:  Mood and Affect: Mood normal.        Behavior: Behavior normal.        Thought Content: Thought content normal.        Cognition and Memory: Cognition and memory normal.        Judgment: Judgment normal.          Temp in office normal range   Assessment & Plan:  Altered mental status, unspecified altered mental status type Assessment & Plan: Mild however reported slight changes per husband.  Ordering ammonia, cbc cmp pending results. Also ordering UA to r/o UTI Ddx rule out infection , elevated ammonia levels  Orders: -     CBC with Differential/Platelet -     POCT Urinalysis Dipstick (Automated) -     Urinalysis, Routine w reflex microscopic -     Urine Culture; Future  Cirrhosis of liver without ascites, unspecified hepatic cirrhosis type (HCC) Assessment & Plan: Worsening edema,  Reached out to hepatology to consider increase in lasix, awaiting call back, goal is to increase to 40 mg once daily.    Orders: -     Ammonia -     Comprehensive metabolic panel  Cellulitis of both lower extremities Assessment & Plan: Mild, however with slight erythema on exam with potential vesicular lesions right lower extremity  Due to cellulitis resulting in severe sepsis inpatient will rx cephalexin 500 mg qid x 7 days  Orders: -     Cephalexin; Take 1 capsule (500 mg total) by mouth 4 (four) times daily for 7 days.  Dispense: 28 capsule; Refill: 0  History of sepsis -     Urine Culture; Future     Return in about 1 week (around 08/11/2023) for f/u with pcp for f/u edema and cellulitis .  Mort Sawyers, MSN, APRN, FNP-C [place]  [facility] Medicine

## 2023-08-04 NOTE — Assessment & Plan Note (Signed)
Mild, however with slight erythema on exam with potential vesicular lesions right lower extremity  Due to cellulitis resulting in severe sepsis inpatient will rx cephalexin 500 mg qid x 7 days

## 2023-08-04 NOTE — Telephone Encounter (Signed)
Saw pt in office, see lab and office notes for review.

## 2023-08-05 ENCOUNTER — Ambulatory Visit: Payer: Medicare Other

## 2023-08-05 DIAGNOSIS — R4182 Altered mental status, unspecified: Secondary | ICD-10-CM

## 2023-08-05 DIAGNOSIS — Z8619 Personal history of other infectious and parasitic diseases: Secondary | ICD-10-CM | POA: Diagnosis not present

## 2023-08-07 LAB — URINE CULTURE
MICRO NUMBER:: 15457899
SPECIMEN QUALITY:: ADEQUATE

## 2023-08-09 ENCOUNTER — Encounter: Payer: Self-pay | Admitting: Nurse Practitioner

## 2023-08-09 DIAGNOSIS — N3000 Acute cystitis without hematuria: Secondary | ICD-10-CM

## 2023-08-09 MED ORDER — AMOXICILLIN 875 MG PO TABS
875.0000 mg | ORAL_TABLET | Freq: Two times a day (BID) | ORAL | 0 refills | Status: AC
Start: 2023-08-09 — End: 2023-08-19

## 2023-08-10 ENCOUNTER — Encounter: Payer: Self-pay | Admitting: Nurse Practitioner

## 2023-08-10 ENCOUNTER — Ambulatory Visit (INDEPENDENT_AMBULATORY_CARE_PROVIDER_SITE_OTHER): Payer: Medicare Other | Admitting: Nurse Practitioner

## 2023-08-10 VITALS — BP 112/62 | HR 62 | Temp 97.5°F | Ht 62.25 in | Wt 156.0 lb

## 2023-08-10 DIAGNOSIS — R600 Localized edema: Secondary | ICD-10-CM

## 2023-08-10 DIAGNOSIS — K7460 Unspecified cirrhosis of liver: Secondary | ICD-10-CM | POA: Diagnosis not present

## 2023-08-10 DIAGNOSIS — R4182 Altered mental status, unspecified: Secondary | ICD-10-CM

## 2023-08-10 LAB — COMPREHENSIVE METABOLIC PANEL
ALT: 51 U/L — ABNORMAL HIGH (ref 0–35)
AST: 75 U/L — ABNORMAL HIGH (ref 0–37)
Albumin: 3.2 g/dL — ABNORMAL LOW (ref 3.5–5.2)
Alkaline Phosphatase: 183 U/L — ABNORMAL HIGH (ref 39–117)
BUN: 24 mg/dL — ABNORMAL HIGH (ref 6–23)
CO2: 30 meq/L (ref 19–32)
Calcium: 9.8 mg/dL (ref 8.4–10.5)
Chloride: 104 meq/L (ref 96–112)
Creatinine, Ser: 0.9 mg/dL (ref 0.40–1.20)
GFR: 64.01 mL/min (ref >60.00)
Glucose, Bld: 82 mg/dL (ref 70–99)
Potassium: 4.7 meq/L (ref 3.5–5.1)
Sodium: 139 meq/L (ref 135–145)
Total Bilirubin: 1.1 mg/dL (ref 0.2–1.2)
Total Protein: 6.8 g/dL (ref 6.0–8.3)

## 2023-08-10 LAB — CBC
HCT: 33 % — ABNORMAL LOW (ref 36.0–46.0)
Hemoglobin: 10.9 g/dL — ABNORMAL LOW (ref 12.0–15.0)
MCHC: 33.1 g/dL (ref 30.0–36.0)
MCV: 102.9 fl — ABNORMAL HIGH (ref 78.0–100.0)
Platelets: 80 10*3/uL — ABNORMAL LOW (ref 150.0–400.0)
RBC: 3.2 Mil/uL — ABNORMAL LOW (ref 3.87–5.11)
RDW: 17.3 % — ABNORMAL HIGH (ref 11.5–15.5)
WBC: 3.4 10*3/uL — ABNORMAL LOW (ref 4.0–10.5)

## 2023-08-10 LAB — BRAIN NATRIURETIC PEPTIDE: Pro B Natriuretic peptide (BNP): 45 pg/mL (ref 0.0–100.0)

## 2023-08-10 MED ORDER — FUROSEMIDE 20 MG PO TABS: 20.0000 mg | ORAL_TABLET | Freq: Every day | ORAL | Status: DC

## 2023-08-10 NOTE — Progress Notes (Signed)
Established Patient Office Visit  Subjective   Patient ID: [name], female    DOB: 09/20/1951  Age: 72 y.o. MRN: [id number]  Chief Complaint  Patient presents with   Follow-up    Pt complains of only feeling a little better. Pt states that she is sleeping a lot more. States that antibiotics are "going steady" also mentions that BP is starting to go back to normal bc the swelling has gone down.     HPI  Edema: Patient was seen by Blenda Mounts, MD on 08/04/2019 for with a chief complaint of altered mental status and cellulitis of bilateral lower extremities.  During the evaluation labs were checked inclusive of CBC, CMP, ammonia and urinalysis.  CBC was grossly normal for patient except patient having a low white blood cell count.  Metabolic panel grossly normal for patient's baseline.  Patient's urine culture came back for Streptococcus agalactiae.  Patient was prophylactically put on Keflex.  Of note patient's ammonia was slightly elevated and increased lactulose from 3 times daily to 4 times daily for a few days.  States that she use to be on lasix 40 a day and was decreased and then put down to as needed. She has been taking 20 mg daily for 07/31/2023.   Patient and spouse states some improvement since being seen. More so since being switched to augmentin abx. States that th other day her temperature did drop and was 95.1 and he wrapped her up to help. States that he got her to 74 and was able to hold her own temp and did not seek treatment     Review of Systems  Constitutional:  Negative for chills and fever.  Respiratory:  Negative for shortness of breath.   Cardiovascular:  Positive for leg swelling. Negative for chest pain.      Objective:     BP 112/62   Pulse 62   Temp (!) 97.5 F (36.4 C) (Temporal)   Ht 5' 2.25" (1.581 m)   Wt 156 lb (70.8 kg)   SpO2 96%   BMI 28.30 kg/m  BP Readings from Last 3 Encounters:  08/10/23 112/62  08/04/23 126/78  07/09/23  114/60   Wt Readings from Last 3 Encounters:  08/10/23 156 lb (70.8 kg)  08/04/23 160 lb (72.6 kg)  07/09/23 156 lb (70.8 kg)      Physical Exam Vitals and nursing note reviewed.  Constitutional:      Appearance: Normal appearance.  Cardiovascular:     Rate and Rhythm: Normal rate and regular rhythm.     Heart sounds: Normal heart sounds.  Pulmonary:     Effort: Pulmonary effort is normal.     Breath sounds: Normal breath sounds.  Abdominal:     General: Bowel sounds are normal.  Musculoskeletal:     Right lower leg: Edema present.     Left lower leg: Edema present.  Neurological:     Mental Status: She is alert.      No results found for any visits on 08/10/23.    The ASCVD Risk score (Arnett DK, et al., 2019) failed to calculate for the following reasons:   Cannot find a previous HDL lab   Cannot find a previous total cholesterol lab    Assessment & Plan:   Problem List Items Addressed This Visit       Digestive   Decompensated liver cirrhosis with portal HTN and gastric varices    Patient is currently followed by the liver  clinic.  On furosemide as needed will change that to daily.  Check basic labs.  Most recent ammonia slightly elevated did increase lactulose for couple days and now back to patient's baseline of 3 times daily.  Good bowel movements      Relevant Orders   CBC   Comprehensive metabolic panel     Other   Altered mental status - Primary    Patient was seen and evaluated urinalysis with treatment Keflex started.  Culture came back switch to Augmentin.  Patient had 2 doses and states the seems to be improving.  Will continue medication as prescribed follow-up with you do not continue to improve      Lower extremity edema    Multifactorial.  Will change patient's Lasix from as needed to daily.  Informed them to reach out with any refill.  Will recheck basic labs today.  I will add on a BNP do think some of the edema is not related to cardiac  issues.      Relevant Orders   CBC   Comprehensive metabolic panel   Brain natriuretic peptide    Return if symptoms worsen or fail to improve, for As scheduled .    Audria Nine, NP

## 2023-08-10 NOTE — Assessment & Plan Note (Signed)
Multifactorial.  Will change patient's Lasix from as needed to daily.  Informed them to reach out with any refill.  Will recheck basic labs today.  I will add on a BNP do think some of the edema is not related to cardiac issues.

## 2023-08-10 NOTE — Patient Instructions (Signed)
Nice to see you today I will be in touch with the labs  Lets do lasix daily Follow up as scheduled, sooner if you need me

## 2023-08-10 NOTE — Assessment & Plan Note (Signed)
Patient was seen and evaluated urinalysis with treatment Keflex started.  Culture came back switch to Augmentin.  Patient had 2 doses and states the seems to be improving.  Will continue medication as prescribed follow-up with you do not continue to improve

## 2023-08-10 NOTE — Assessment & Plan Note (Signed)
Patient is currently followed by the liver clinic.  On furosemide as needed will change that to daily.  Check basic labs.  Most recent ammonia slightly elevated did increase lactulose for couple days and now back to patient's baseline of 3 times daily.  Good bowel movements

## 2023-08-18 ENCOUNTER — Telehealth: Payer: Self-pay | Admitting: Nurse Practitioner

## 2023-08-18 NOTE — Telephone Encounter (Signed)
Prescription Request  08/18/2023  LOV: 08/10/2023  What is the name of the medication or equipment? hydrocortisone (CORTEF) 5 MG tablet   Have you contacted your pharmacy to request a refill? No   Which pharmacy would you like this sent to?  [facility] - [place], [place] - [address] [place] [address] Phone: [phone] Fax: [phone]    Patient notified that their request is being sent to the clinical staff for review and that they should receive a response within 2 business days.   Please advise at [facility] [phone]

## 2023-08-19 ENCOUNTER — Encounter: Payer: Self-pay | Admitting: "Endocrinology

## 2023-08-19 NOTE — Telephone Encounter (Signed)
LAST APPOINTMENT DATE: 08/10/2023   NEXT APPOINTMENT DATE: 09/09/2023   CORTEF 5mg   LAST REFILL: 04/14/23  QTY: #90 1RF

## 2023-08-19 NOTE — Telephone Encounter (Signed)
Pt's son called to check status of med refill? Call back # [phone]

## 2023-08-19 NOTE — Telephone Encounter (Signed)
Called patient she will reach out to endocrinology

## 2023-08-19 NOTE — Telephone Encounter (Signed)
This through her endocrine provider please reach out to them

## 2023-08-20 ENCOUNTER — Other Ambulatory Visit: Payer: Self-pay

## 2023-08-20 DIAGNOSIS — E2740 Unspecified adrenocortical insufficiency: Secondary | ICD-10-CM

## 2023-08-20 MED ORDER — HYDROCORTISONE 5 MG PO TABS: 5.0000 mg | ORAL_TABLET | Freq: Three times a day (TID) | ORAL | 1 refills | Status: DC

## 2023-08-23 DIAGNOSIS — N3020 Other chronic cystitis without hematuria: Secondary | ICD-10-CM | POA: Diagnosis not present

## 2023-09-07 DIAGNOSIS — E440 Moderate protein-calorie malnutrition: Secondary | ICD-10-CM | POA: Diagnosis not present

## 2023-09-07 DIAGNOSIS — K7682 Hepatic encephalopathy: Secondary | ICD-10-CM | POA: Diagnosis not present

## 2023-09-07 DIAGNOSIS — R600 Localized edema: Secondary | ICD-10-CM | POA: Diagnosis not present

## 2023-09-07 DIAGNOSIS — K766 Portal hypertension: Secondary | ICD-10-CM | POA: Diagnosis not present

## 2023-09-07 DIAGNOSIS — K7469 Other cirrhosis of liver: Secondary | ICD-10-CM | POA: Diagnosis not present

## 2023-09-07 DIAGNOSIS — I8510 Secondary esophageal varices without bleeding: Secondary | ICD-10-CM | POA: Diagnosis not present

## 2023-09-09 ENCOUNTER — Ambulatory Visit (INDEPENDENT_AMBULATORY_CARE_PROVIDER_SITE_OTHER): Payer: Medicare Other | Admitting: Nurse Practitioner

## 2023-09-09 ENCOUNTER — Other Ambulatory Visit: Payer: Self-pay | Admitting: Nurse Practitioner

## 2023-09-09 ENCOUNTER — Encounter: Payer: Self-pay | Admitting: Nurse Practitioner

## 2023-09-09 ENCOUNTER — Telehealth: Payer: Self-pay | Admitting: Nurse Practitioner

## 2023-09-09 VITALS — BP 110/74 | HR 67 | Ht 62.25 in | Wt 161.4 lb

## 2023-09-09 DIAGNOSIS — K7469 Other cirrhosis of liver: Secondary | ICD-10-CM

## 2023-09-09 DIAGNOSIS — Z13820 Encounter for screening for osteoporosis: Secondary | ICD-10-CM | POA: Diagnosis not present

## 2023-09-09 DIAGNOSIS — Z1231 Encounter for screening mammogram for malignant neoplasm of breast: Secondary | ICD-10-CM

## 2023-09-09 DIAGNOSIS — I10 Essential (primary) hypertension: Secondary | ICD-10-CM

## 2023-09-09 DIAGNOSIS — E2740 Unspecified adrenocortical insufficiency: Secondary | ICD-10-CM | POA: Diagnosis not present

## 2023-09-09 DIAGNOSIS — K7460 Unspecified cirrhosis of liver: Secondary | ICD-10-CM | POA: Diagnosis not present

## 2023-09-09 DIAGNOSIS — Z0000 Encounter for general adult medical examination without abnormal findings: Secondary | ICD-10-CM | POA: Diagnosis not present

## 2023-09-09 DIAGNOSIS — E663 Overweight: Secondary | ICD-10-CM | POA: Diagnosis not present

## 2023-09-09 DIAGNOSIS — E039 Hypothyroidism, unspecified: Secondary | ICD-10-CM | POA: Diagnosis not present

## 2023-09-09 DIAGNOSIS — R011 Cardiac murmur, unspecified: Secondary | ICD-10-CM

## 2023-09-09 DIAGNOSIS — I8500 Esophageal varices without bleeding: Secondary | ICD-10-CM | POA: Diagnosis not present

## 2023-09-09 DIAGNOSIS — Z13220 Encounter for screening for lipoid disorders: Secondary | ICD-10-CM

## 2023-09-09 DIAGNOSIS — R600 Localized edema: Secondary | ICD-10-CM | POA: Diagnosis not present

## 2023-09-09 NOTE — Patient Instructions (Signed)
Nice to see you today I will be I touch with the labs Follow up with me in 3 months

## 2023-09-09 NOTE — Assessment & Plan Note (Signed)
Stable

## 2023-09-09 NOTE — Assessment & Plan Note (Signed)
Discussed age-appropriate immunizations and screening exams.  Did review patient's personal, surgical, social, family history.  Patient is up-to-date on all age-appropriate vaccinations she would like.  Update tetanus and shingles vaccine at pharmacy consider flu vaccine today.  Patient is due for CRC screening in setting of colorectal cancer in the past.  Patient has aged out of cervical cancer screening.  Order placed for mammogram and bone density scan.  Patient was given information at discharge about preventative healthcare maintenance with anticipatory guidance.

## 2023-09-09 NOTE — Assessment & Plan Note (Signed)
Patient currently maintained on levothyroxine.  Continue medication as prescribed pending TSH today

## 2023-09-09 NOTE — Assessment & Plan Note (Signed)
Patient currently followed by liver clinic recently seen this week.  Continue medication as prescribed continue following up with specialist as recommended

## 2023-09-09 NOTE — Assessment & Plan Note (Signed)
History of same likely secondary to decompensated liver failure.  Patient on furosemide and spironolactone.  Wearing compression socks and elevate legs when available.  Continue medication as prescribed

## 2023-09-09 NOTE — Telephone Encounter (Signed)
Dr. Maxie Barb in office for CPE and discussed colonoscopy. States that she was suppose to have on with Dr. Orvan Falconer prior to her leaving. I told her I would reach out to you to see your thoughts and recommendations.  Thanks,  [employer]

## 2023-09-09 NOTE — Assessment & Plan Note (Signed)
Has had ligation of esophageal varices in the past.  Is followed by liver clinic and GI.  Continue following as recommended

## 2023-09-09 NOTE — Assessment & Plan Note (Signed)
Historical diagnosis.  Patient is on spironolactone and furosemide blood pressure within normal limits continue medication as prescribed

## 2023-09-09 NOTE — Progress Notes (Signed)
Established Patient Office Visit  Subjective   Patient ID: [name], female    DOB: 02-Dec-1950  Age: 72 y.o. MRN: [id number]  Chief Complaint  Patient presents with   Annual Exam    HPI  HTN: she is on lasix and furesomide.  Liver cirrhosis: seen liver clinic 2 days a go. States that they increased the dieurectics for the next 3 days and then will go back to baseline  Hypothyroidism: on levothyroxine. Tolerates medication well   Hx of basal cell: history of same with removal and yearly visit with dermatology   Colon cancer: states that they were seeing kim beavers. They are followed by [facility] and not had a colonoscopy   for complete physical and follow up of chronic conditions.  Immunizations: -Tetanus: Completed in unsure, get at local pharmacy  -Influenza: defer -Shingles: get at local pharmacy  -Pneumonia: Completed 2024  Diet: Fair diet. 3 meals a day with snacks. Water  Exercise: No regular exercise. States that she is doing seated and standing exercises from PT  Eye exam: Completes annually had [facility] eye surgery Dental exam: Completes semi-annually    Colonoscopy: needs updating  followed by GI Lung Cancer Screening: NA  Pap smear: aged out  Mammogram: 11/02/2022  Dexa: order placed  Sleep: states that she will go to sleep around 11-12 and gets up 8-9. Feels rested       Review of Systems  Constitutional:  Negative for chills and fever.  Respiratory:  Negative for shortness of breath.   Cardiovascular:  Negative for chest pain and leg swelling.  Gastrointestinal:  Negative for abdominal pain, blood in stool, constipation, diarrhea, nausea and vomiting.       BM daily  Genitourinary:  Negative for dysuria and hematuria.  Neurological:  Negative for tingling and headaches.  Psychiatric/Behavioral:  Negative for hallucinations and suicidal ideas.       Objective:     BP 110/74   Pulse 67   Ht 5' 2.25" (1.581 m)   Wt 161 lb 6.4 oz  (73.2 kg)   SpO2 98%   BMI 29.28 kg/m  BP Readings from Last 3 Encounters:  09/09/23 110/74  08/10/23 112/62  08/04/23 126/78   Wt Readings from Last 3 Encounters:  09/09/23 161 lb 6.4 oz (73.2 kg)  08/10/23 156 lb (70.8 kg)  08/04/23 160 lb (72.6 kg)   SpO2 Readings from Last 3 Encounters:  09/09/23 98%  08/10/23 96%  08/04/23 94%      Physical Exam Vitals and nursing note reviewed.  Constitutional:      Appearance: Normal appearance.  HENT:     Right Ear: Tympanic membrane, ear canal and external ear normal.     Left Ear: Tympanic membrane, ear canal and external ear normal.     Mouth/Throat:     Mouth: Mucous membranes are moist.     Pharynx: Oropharynx is clear.  Eyes:     Extraocular Movements: Extraocular movements intact.     Pupils: Pupils are equal, round, and reactive to light.  Cardiovascular:     Rate and Rhythm: Normal rate and regular rhythm.     Pulses: Normal pulses.     Heart sounds: Normal heart sounds.  Pulmonary:     Effort: Pulmonary effort is normal.     Breath sounds: Normal breath sounds.  Abdominal:     General: Bowel sounds are normal. There is no distension.     Palpations: There is no mass.  Tenderness: There is no abdominal tenderness.     Hernia: No hernia is present.  Musculoskeletal:     Right lower leg: Edema present.     Left lower leg: Edema present.  Lymphadenopathy:     Cervical: No cervical adenopathy.  Skin:    General: Skin is warm.  Neurological:     General: No focal deficit present.     Mental Status: She is alert.     Deep Tendon Reflexes:     Reflex Scores:      Bicep reflexes are 2+ on the right side and 2+ on the left side.      Patellar reflexes are 2+ on the right side and 2+ on the left side.    Comments: Bilateral upper and lower extremity strength 5/5  Psychiatric:        Mood and Affect: Mood normal.        Behavior: Behavior normal.        Thought Content: Thought content normal.        Judgment:  Judgment normal.      No results found for any visits on 09/09/23.    The ASCVD Risk score (Arnett DK, et al., 2019) failed to calculate for the following reasons:   Cannot find a previous HDL lab   Cannot find a previous total cholesterol lab    Assessment & Plan:   Problem List Items Addressed This Visit       Cardiovascular and Mediastinum   Esophageal varices (HCC)    Has had ligation of esophageal varices in the past.  Is followed by liver clinic and GI.  Continue following as recommended      Essential hypertension   Relevant Orders   Lipid panel   Hypertension    Historical diagnosis.  Patient is on spironolactone and furosemide blood pressure within normal limits continue medication as prescribed        Digestive   Decompensated liver cirrhosis with portal HTN and gastric varices    Patient currently followed by liver clinic recently seen this week.  Continue medication as prescribed continue following up with specialist as recommended        Endocrine   Hypothyroidism    Patient currently maintained on levothyroxine.  Continue medication as prescribed pending TSH today      Relevant Orders   TSH   Adrenal insufficiency (HCC)    Followed by endocrine on hydrocortisone daily continue        Other   Murmur, cardiac    Stable      Lower extremity edema    History of same likely secondary to decompensated liver failure.  Patient on furosemide and spironolactone.  Wearing compression socks and elevate legs when available.  Continue medication as prescribed      Preventative health care - Primary    Discussed age-appropriate immunizations and screening exams.  Did review patient's personal, surgical, social, family history.  Patient is up-to-date on all age-appropriate vaccinations she would like.  Update tetanus and shingles vaccine at pharmacy consider flu vaccine today.  Patient is due for CRC screening in setting of colorectal cancer in the past.   Patient has aged out of cervical cancer screening.  Order placed for mammogram and bone density scan.  Patient was given information at discharge about preventative healthcare maintenance with anticipatory guidance.      Other Visit Diagnoses     Overweight (BMI 25.0-29.9)       Relevant Orders  Lipid panel   Screening for lipid disorders       Relevant Orders   Lipid panel   Screening mammogram for breast cancer       Relevant Orders   MM 3D SCREENING MAMMOGRAM BILATERAL BREAST   Screening for osteoporosis       Relevant Orders   DG Bone Density       Return in about 3 months (around 12/10/2023) for Liver, mental status .    Audria Nine, NP

## 2023-09-09 NOTE — Assessment & Plan Note (Signed)
Followed by endocrine on hydrocortisone daily continue

## 2023-09-10 LAB — LIPID PANEL
Cholesterol: 154 mg/dL (ref 0–200)
HDL: 64.9 mg/dL (ref >39.00)
LDL Cholesterol: 75 mg/dL (ref 0–99)
NonHDL: 88.82
Total CHOL/HDL Ratio: 2
Triglycerides: 69 mg/dL (ref 0.0–149.0)
VLDL: 13.8 mg/dL (ref 0.0–40.0)

## 2023-09-10 LAB — TSH: TSH: 0.2 u[IU]/mL — ABNORMAL LOW (ref 0.35–5.50)

## 2023-09-17 ENCOUNTER — Other Ambulatory Visit: Payer: Self-pay | Admitting: Nurse Practitioner

## 2023-09-17 DIAGNOSIS — E039 Hypothyroidism, unspecified: Secondary | ICD-10-CM

## 2023-09-20 ENCOUNTER — Ambulatory Visit: Payer: Medicare Other | Admitting: "Endocrinology

## 2023-09-20 ENCOUNTER — Encounter: Payer: Self-pay | Admitting: "Endocrinology

## 2023-09-20 VITALS — BP 120/60 | HR 60 | Ht 62.0 in | Wt 155.6 lb

## 2023-09-20 DIAGNOSIS — E2740 Unspecified adrenocortical insufficiency: Secondary | ICD-10-CM | POA: Diagnosis not present

## 2023-09-20 NOTE — Progress Notes (Signed)
Outpatient Endocrinology Note Altamese So-Hi, MD    [name] [birth year] [id number]  Referring Provider: Eden Emms, NP Primary Care Provider: Eden Emms, NP Reason for consultation: Subjective   Assessment & Plan  Diagnoses and all orders for this visit:  Adrenal insufficiency (HCC) -     ACTH; Future -     Cortisol; Future -     DHEA-sulfate; Future    Patient had a low evening cortisol around 3, followed by a stim test which was short of 0.2, with final result and cortisol of 17.8 starting from a baseline of 4.2.  In cases where cortisol does not rise above 18, we need to know the if the assay used to measure cortisol a more specific assay, for example, immunoassay using a more specific monoclonal antibody to cortisol?  If yes adrenal insufficiency is unlikely.  Since the assay type is unknown, we have to obtain age- and sex- specific DHEA-sulfate. If it is low, adrenal insufficiency is likely.  However in this case, given the cortisol level rose almost to 18, the adrenal insufficiency seems to be unlikely. DHEA-S WNL as well. Currently taking hydrocortisone 5 mg qam and feels great. Instructed to wean off HC 5 mg gradually (alternating between 5 mg and 2.5 mg, and then 2.5 mg and 0 mg to finally 0 mg) over next 3 weeks and repeat 8 am labs after.   Return in about 4 weeks (around 10/18/2023) for visit and 8 am labs before next visit.    I have reviewed current medications, nurse's notes, allergies, vital signs, past medical and surgical history, family medical history, and social history for this encounter. Counseled patient on symptoms, examination findings, lab findings, imaging results, treatment decisions and monitoring and prognosis. The patient understood the recommendations and agrees with the treatment plan. All questions regarding treatment plan were fully answered.  Altamese [place], MD  09/20/23   History of Present Illness HPI  Pt is here for  adrenal insufficiency work up.  Currently on HC 5 mg in am, no other doses in the day   Feels better, cooking again Feel energetic  No abdominal pain/nausea/vomiting/weight loss  Initial work up:  Was admitted in hospital for hepatic encephalopathy/hyperammonemia in 02/2023 and was found to have low cortisol of 2.9 around 6 pm on 03/11/23.   She is taking hydrocortisone 5 mg tid since 02/2023  Currently taking hydrocortisone 10 mg qam and 5 mg at 3 pm   Patient feels well otherwise, has ntoed no chage   In 03/2023 admitted again for infection  After holding hydrocortisone 5 mg dose in evening Component     Latest Ref Rng 05/11/2023  Cortisol, Plasma     ug/dL 5.0      ACTH Stimulation test  Component     Latest Ref Rng 03/12/2023  Cortisol, Base     ug/dL 4.2   Cortisol, 30 Min     ug/dL 40.9   Cortisol, 60 Min     ug/dL 81.1      Component Ref Range & Units 05/05/23 2 wk ago 2 mo ago 2 yr ago  Cortisol, Plasma ug/dL 91.4 78.2 CM 2.9 CM 9.7 CM  Comment: AM:  4.3 - 22.4 ug/dLPM:  3.1 - 16.7 ug/dL   While on HC 5 mg tid:  Component     Latest Ref Rng 05/05/2023  Sodium     135 - 145 mEq/L 139   Potassium  3.5 - 5.1 mEq/L 3.9   Chloride     96 - 112 mEq/L 102   CO2     19 - 32 mEq/L 28   Glucose     70 - 99 mg/dL 88   BUN     6 - 23 mg/dL 13   Creatinine     1.61 - 1.20 mg/dL 0.96   GFR     >04.54 mL/min 88.93   Calcium     8.4 - 10.5 mg/dL 9.1   DHEA-SO4     4 - 157 mcg/dL 4   ALDOSTERONE      ng/dL 2   Cortisol, Plasma     ug/dL 09.8     Physical Exam  BP 120/60   Pulse 60   Ht 5\' 2"  (1.575 m)   Wt 155 lb 9.6 oz (70.6 kg)   SpO2 95%   BMI 28.46 kg/m    Constitutional: well developed, well nourished Head: normocephalic, atraumatic Eyes: sclera anicteric, no redness Neck: supple Lungs: normal respiratory effort Neurology: alert and oriented Skin: dry, no appreciable rashes Musculoskeletal: no appreciable defects Psychiatric: normal  mood and affect   Current Medications Patient's Medications  New Prescriptions   No medications on file  Previous Medications   ACIDOPHILUS (RISAQUAD) CAPS CAPSULE    Take 1 capsule by mouth daily.   CRANBERRY PO    Take 1 tablet by mouth 2 (two) times daily.   ESTRADIOL (ESTRACE) 0.1 MG/GM VAGINAL CREAM    Place 1 Applicatorful vaginally every other day.   FUROSEMIDE (LASIX) 20 MG TABLET    Take 1 tablet (20 mg total) by mouth daily.   HYDROCORTISONE (CORTEF) 5 MG TABLET    Take 1 tablet (5 mg total) by mouth 3 (three) times daily.   LACTULOSE (CHRONULAC) 10 GM/15ML SOLUTION    Take 45 mLs (30 g total) by mouth 3 (three) times daily.   LEVOCARNITINE (CARNITOR) 330 MG TABLET    Take 330 mg by mouth 3 (three) times daily.   LIVER OIL-ZINC OXIDE (DESITIN) 40 % OINTMENT    Apply topically daily as needed for irritation.   MULTIPLE VITAMIN (MULTI-VITAMIN) TABLET    Take 1 tablet by mouth daily.   POLYETHYLENE GLYCOL (MIRALAX / GLYCOLAX) 17 G PACKET    Take 17 g by mouth daily as needed for mild constipation.   SPIRONOLACTONE (ALDACTONE) 25 MG TABLET    Take 1 tablet (25 mg total) by mouth daily.   SYNTHROID 75 MCG TABLET    TAKE 1 TABLET BY MOUTH DAILY BEFORE BREAKFAST. MUST BE SEEN IN OFFICE FOR MORE REFILLS  Modified Medications   No medications on file  Discontinued Medications   No medications on file    Allergies Allergies  Allergen Reactions   Contrast Media [Iodinated Contrast Media] Hives    Past Medical History Past Medical History:  Diagnosis Date   Acute urinary retention 05/04/2022   Allergy 2006 ?   Contrast dye   Arthritis 2016 ??   Knees and thumb   Cancer [facility])    cecum   Cataract 2021   Surgery scheduled July 2023   Colon cancer [facility]) 2003   Elevated liver function tests    Esophageal varices (HCC)    Heart murmur On file   Hemorrhage of gastrointestinal tract 05/04/2011   Hypertension 2021   Hypothyroidism    Iron deficiency anemia    Liver disease     chemotherapy complication, per pt, shunts placed to bypass  liver   Malignant neoplasm of cecum (HCC)    Portal hypertension (HCC)    Skin cancer 2019   Splenomegaly     Past Surgical History Past Surgical History:  Procedure Laterality Date   COLON SURGERY  2004   Cancer   COSMETIC SURGERY  2021   Skin cancer   ESOPHAGEAL VARICE LIGATION     EYE SURGERY     HEMICOLECTOMY  01/08/2003   IR RADIOLOGIST EVAL & MGMT  12/20/2020   IR RADIOLOGIST EVAL & MGMT  05/29/2021   LIVER SURGERY     shunts placed after chemo complication   SKIN FULL THICKNESS GRAFT N/A 09/12/2019   Procedure: debridement and FTSG to the nose from left upper arm;  Surgeon: Allena Napoleon, MD;  Location: [facility];  Service: Plastics;  Laterality: N/A;  2 hours, please   TIPS PROCEDURE      Family History family history includes Arthritis in her father and mother; Cancer in her maternal aunt; Diabetes in her father; Hearing loss in her mother; Heart disease in her father and mother; Hypertension in her mother; Miscarriages / [place] in her mother.  Social History Social History   Socioeconomic History   Marital status: Married    Spouse name: [name]   Number of children: 2   Years of education: Not on file   Highest education level: 12th grade  Occupational History   Occupation: Comptroller  Tobacco Use   Smoking status: Never   Smokeless tobacco: Never   Tobacco comments:    Never smoked  Vaping Use   Vaping status: Never Used  Substance and Sexual Activity   Alcohol use: Never   Drug use: Never   Sexual activity: Not Currently    Partners: Male  Other Topics Concern   Not on file  Social History Narrative   12/04/20   From: the area   Living: with husband, [name] (1994)   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      Family: 2 children Designer, fashion/clothing and Optometrist - 2 grandchildren - nearby      Enjoys: read      Exercise: trying to get back to exercise   Diet: healthy,  limits fast foods      Safety   Seat belts: Yes    Guns: Yes  and secure   Safe in relationships: Yes    Social Determinants of Health   Financial Resource Strain: Low Risk  (08/03/2023)   Overall Financial Resource Strain (CARDIA)    Difficulty of Paying Living Expenses: Not hard at all  Food Insecurity: No Food Insecurity (08/03/2023)   Hunger Vital Sign    Worried About Running Out of Food in the Last Year: Never true    Ran Out of Food in the Last Year: Never true  Transportation Needs: No Transportation Needs (08/03/2023)   PRAPARE - Administrator, Civil Service (Medical): No    Lack of Transportation (Non-Medical): No  Physical Activity: Inactive (08/03/2023)   Exercise Vital Sign    Days of Exercise per Week: 0 days    Minutes of Exercise per Session: 0 min  Stress: No Stress Concern Present (08/03/2023)   [employer] of Occupational Health - Occupational Stress Questionnaire    Feeling of Stress : Not at all  Social Connections: Socially Integrated (08/03/2023)   Social Connection and Isolation Panel [NHANES]    Frequency of Communication with Friends and Family: Twice a week    Frequency  of Social Gatherings with Friends and Family: Once a week    Attends Religious Services: More than 4 times per year    Active Member of Clubs or Organizations: Yes    Attends Banker Meetings: More than 4 times per year    Marital Status: Married  Intimate Partner Violence: Not At Risk (04/22/2023)   Humiliation, Afraid, Rape, and Kick questionnaire    Fear of Current or Ex-Partner: No    Emotionally Abused: No    Physically Abused: No    Sexually Abused: No    Lab Results  Component Value Date   CHOL 154 09/09/2023   Lab Results  Component Value Date   HDL 64.90 09/09/2023   Lab Results  Component Value Date   LDLCALC 75 09/09/2023   Lab Results  Component Value Date   TRIG 69.0 09/09/2023   Lab Results  Component Value Date   CHOLHDL 2  09/09/2023   Lab Results  Component Value Date   CREATININE 0.90 08/10/2023   Lab Results  Component Value Date   GFR 64.01 08/10/2023      Component Value Date/Time   NA 139 08/10/2023 0946   NA 143 03/19/2023 1532   NA 143 01/08/2015 1547   K 4.7 08/10/2023 0946   K 4.1 01/08/2015 1547   CL 104 08/10/2023 0946   CL 109 (H) 11/28/2012 1555   CO2 30 08/10/2023 0946   CO2 25 01/08/2015 1547   GLUCOSE 82 08/10/2023 0946   GLUCOSE 81 01/08/2015 1547   GLUCOSE 72 11/28/2012 1555   BUN 24 (H) 08/10/2023 0946   BUN 14 03/19/2023 1532   BUN 11.6 01/08/2015 1547   CREATININE 0.90 08/10/2023 0946   CREATININE 0.8 01/08/2015 1547   CALCIUM 9.8 08/10/2023 0946   CALCIUM 8.8 01/08/2015 1547   PROT 6.8 08/10/2023 0946   PROT 6.5 03/19/2023 1532   PROT 7.0 01/08/2015 1547   ALBUMIN 3.2 (L) 08/10/2023 0946   ALBUMIN 3.9 03/19/2023 1532   ALBUMIN 3.5 01/08/2015 1547   AST 75 (H) 08/10/2023 0946   AST 43 (H) 01/08/2015 1547   ALT 51 (H) 08/10/2023 0946   ALT 26 01/08/2015 1547   ALKPHOS 183 (H) 08/10/2023 0946   ALKPHOS 98 01/08/2015 1547   BILITOT 1.1 08/10/2023 0946   BILITOT 1.1 03/19/2023 1532   BILITOT 0.76 01/08/2015 1547   GFRNONAA >60 04/27/2023 0437   GFRAA >60 08/17/2020 0135      Latest Ref Rng & Units 08/10/2023    9:46 AM 08/04/2023    9:16 AM 05/05/2023   12:03 PM  BMP  Glucose 70 - 99 mg/dL 82  55  88   BUN 6 - 23 mg/dL 24  23  13    Creatinine 0.40 - 1.20 mg/dL 7.82  9.56  2.13   Sodium 135 - 145 mEq/L 139  140  139   Potassium 3.5 - 5.1 mEq/L 4.7  4.9  3.9   Chloride 96 - 112 mEq/L 104  105  102   CO2 19 - 32 mEq/L 30  30  28    Calcium 8.4 - 10.5 mg/dL 9.8  9.6  9.1        Component Value Date/Time   WBC 3.4 (L) 08/10/2023 0946   RBC 3.20 (L) 08/10/2023 0946   HGB 10.9 (L) 08/10/2023 0946   HGB 11.1 04/02/2023 1205   HGB 12.7 01/08/2015 1546   HCT 33.0 (L) 08/10/2023 0946   HCT 32.3 (L) 04/02/2023 1205  HCT 39.7 01/08/2015 1546   PLT 80.0 (L)  08/10/2023 0946   PLT 98 (LL) 04/02/2023 1205   MCV 102.9 (H) 08/10/2023 0946   MCV 99 (H) 04/02/2023 1205   MCV 97.8 01/08/2015 1546   MCH 34.3 (H) 04/27/2023 0437   MCHC 33.1 08/10/2023 0946   RDW 17.3 (H) 08/10/2023 0946   RDW 15.8 (H) 04/02/2023 1205   RDW 13.4 01/08/2015 1546   LYMPHSABS 0.7 08/04/2023 0916   LYMPHSABS 1.2 01/08/2015 1546   MONOABS 0.3 08/04/2023 0916   MONOABS 0.3 01/08/2015 1546   EOSABS 0.1 08/04/2023 0916   EOSABS 0.2 01/08/2015 1546   BASOSABS 0.0 08/04/2023 0916   BASOSABS 0.0 01/08/2015 1546   Lab Results  Component Value Date   TSH 0.20 (L) 09/09/2023   TSH 1.260 02/25/2023   TSH 0.98 01/04/2023   FREET4 0.88 01/15/2021   FREET4 0.79 08/01/2020   FREET4 0.48 (L) 12/25/2010         Parts of this note may have been dictated using voice recognition software. There may be variances in spelling and vocabulary which are unintentional. Not all errors are proofread. Please notify the Thereasa Parkin if any discrepancies are noted or if the meaning of any statement is not clear.

## 2023-10-12 ENCOUNTER — Other Ambulatory Visit (INDEPENDENT_AMBULATORY_CARE_PROVIDER_SITE_OTHER): Payer: Medicare Other

## 2023-10-12 ENCOUNTER — Ambulatory Visit: Payer: Medicare Other

## 2023-10-12 DIAGNOSIS — E2740 Unspecified adrenocortical insufficiency: Secondary | ICD-10-CM | POA: Diagnosis not present

## 2023-10-12 LAB — CORTISOL: Cortisol, Plasma: 7.8 ug/dL

## 2023-10-15 DIAGNOSIS — K7460 Unspecified cirrhosis of liver: Secondary | ICD-10-CM | POA: Diagnosis not present

## 2023-10-16 ENCOUNTER — Other Ambulatory Visit: Payer: Self-pay | Admitting: "Endocrinology

## 2023-10-16 DIAGNOSIS — E2740 Unspecified adrenocortical insufficiency: Secondary | ICD-10-CM

## 2023-10-17 LAB — DHEA-SULFATE: DHEA-SO4: 13 ug/dL (ref 4–157)

## 2023-10-17 LAB — ACTH: C206 ACTH: 18 pg/mL (ref 6–50)

## 2023-10-19 ENCOUNTER — Other Ambulatory Visit: Payer: Self-pay

## 2023-10-19 ENCOUNTER — Ambulatory Visit (INDEPENDENT_AMBULATORY_CARE_PROVIDER_SITE_OTHER): Payer: Medicare Other | Admitting: "Endocrinology

## 2023-10-19 ENCOUNTER — Encounter: Payer: Self-pay | Admitting: "Endocrinology

## 2023-10-19 VITALS — BP 104/60 | HR 71 | Ht 62.0 in | Wt 156.8 lb

## 2023-10-19 DIAGNOSIS — E2740 Unspecified adrenocortical insufficiency: Secondary | ICD-10-CM

## 2023-10-19 MED ORDER — DEXAMETHASONE 1 MG PO TABS: 1.0000 mg | ORAL_TABLET | Freq: Once | ORAL | 0 refills | Status: DC

## 2023-10-19 NOTE — Progress Notes (Signed)
Outpatient Endocrinology Note Katherine Blackford, MD    [name] [birth year] [id number]  Referring Provider: Eden Emms, NP Primary Care Provider: Eden Emms, NP Reason for consultation: Subjective   Assessment & Plan  Diagnoses and all orders for this visit:  Adrenal insufficiency (HCC) -     Cancel: Cortisol  Other orders -     Discontinue: dexamethasone (DECADRON) 1 MG tablet; Take 1 tablet (1 mg total) by mouth once for 1 dose. Take at 11 pm followed by blood work next morning at 8 am. Timings are specific.   Patient had a low evening cortisol around 3, followed by a stim test which was short of 0.2, with final result and cortisol of 17.8 starting from a baseline of 4.2.  In cases where cortisol does not rise above 18, we need to know the if the assay used to measure cortisol a more specific assay, for example, immunoassay using a more specific monoclonal antibody to cortisol?  If yes adrenal insufficiency is unlikely.  Since the assay type is unknown, we have to obtain age- and sex- specific DHEA-sulfate. If it is low, adrenal insufficiency is likely.  However in this case, given the cortisol level rose almost to 18, the adrenal insufficiency seems to be unlikely. DHEA-S WNL as well. Currently off of hydrocortisone and feel well 09/2023 Off of HC cortisol is 7.8 with normal DHEAS Ordered ACTH stimulation test  Return in about 3 months (around 01/19/2024).    I have reviewed current medications, nurse's notes, allergies, vital signs, past medical and surgical history, family medical history, and social history for this encounter. Counseled patient on symptoms, examination findings, lab findings, imaging results, treatment decisions and monitoring and prognosis. The patient understood the recommendations and agrees with the treatment plan. All questions regarding treatment plan were fully answered.  Katherine Kilbourne, MD  10/19/23   History of Present  Illness HPI  Pt is here for adrenal insufficiency work up.  Weaned completely off of hydrocortisone  Feels better, cooking again Feel energetic in day, tried in  No abdominal pain/vomiting/weight loss Husband report occasional nausea   Initial work up:  Was admitted in hospital for hepatic encephalopathy/hyperammonemia in 02/2023 and was found to have low cortisol of 2.9 around 6 pm on 03/11/23.   She is taking hydrocortisone 5 mg tid since 02/2023  Currently taking hydrocortisone 10 mg qam and 5 mg at 3 pm   Patient feels well otherwise, has noted no chage   In 03/2023 admitted again for infection  After holding hydrocortisone 5 mg dose in evening Component     Latest Ref Rng 05/11/2023  Cortisol, Plasma     ug/dL 5.0      ACTH Stimulation test  Component     Latest Ref Rng 03/12/2023  Cortisol, Base     ug/dL 4.2   Cortisol, 30 Min     ug/dL 61.6   Cortisol, 60 Min     ug/dL 07.3      Component Ref Range & Units 05/05/23 2 wk ago 2 mo ago 2 yr ago  Cortisol, Plasma ug/dL 71.0 62.6 CM 2.9 CM 9.7 CM  Comment: AM:  4.3 - 22.4 ug/dLPM:  3.1 - 16.7 ug/dL   While on HC 5 mg tid:  Component     Latest Ref Rng 05/05/2023  Sodium     135 - 145 mEq/L 139   Potassium     3.5 - 5.1 mEq/L 3.9  Chloride     96 - 112 mEq/L 102   CO2     19 - 32 mEq/L 28   Glucose     70 - 99 mg/dL 88   BUN     6 - 23 mg/dL 13   Creatinine     6.23 - 1.20 mg/dL 7.62   GFR     >83.15 mL/min 88.93   Calcium     8.4 - 10.5 mg/dL 9.1   DHEA-SO4     4 - 157 mcg/dL 4   ALDOSTERONE      ng/dL 2   Cortisol, Plasma     ug/dL 17.6     Physical Exam  BP 104/60   Pulse 71   Ht 5\' 2"  (1.575 m)   Wt 156 lb 12.8 oz (71.1 kg)   SpO2 96%   BMI 28.68 kg/m    Constitutional: well developed, well nourished Head: normocephalic, atraumatic Eyes: sclera anicteric, no redness Neck: supple Lungs: normal respiratory effort Neurology: alert and oriented Skin: dry, no appreciable  rashes Musculoskeletal: no appreciable defects Psychiatric: normal mood and affect   Current Medications Patient's Medications  New Prescriptions   No medications on file  Previous Medications   ACIDOPHILUS (RISAQUAD) CAPS CAPSULE    Take 1 capsule by mouth daily.   CRANBERRY PO    Take 1 tablet by mouth 2 (two) times daily.   ESTRADIOL (ESTRACE) 0.1 MG/GM VAGINAL CREAM    Place 1 Applicatorful vaginally every other day.   FUROSEMIDE (LASIX) 20 MG TABLET    Take 1 tablet (20 mg total) by mouth daily.   HYDROCORTISONE (CORTEF) 5 MG TABLET    Take 1 tablet (5 mg total) by mouth 3 (three) times daily.   LACTULOSE (CHRONULAC) 10 GM/15ML SOLUTION    Take 45 mLs (30 g total) by mouth 3 (three) times daily.   LEVOCARNITINE (CARNITOR) 330 MG TABLET    Take 330 mg by mouth 3 (three) times daily.   LIVER OIL-ZINC OXIDE (DESITIN) 40 % OINTMENT    Apply topically daily as needed for irritation.   MULTIPLE VITAMIN (MULTI-VITAMIN) TABLET    Take 1 tablet by mouth daily.   POLYETHYLENE GLYCOL (MIRALAX / GLYCOLAX) 17 G PACKET    Take 17 g by mouth daily as needed for mild constipation.   SPIRONOLACTONE (ALDACTONE) 25 MG TABLET    Take 1 tablet (25 mg total) by mouth daily.   SYNTHROID 75 MCG TABLET    TAKE 1 TABLET BY MOUTH DAILY BEFORE BREAKFAST. MUST BE SEEN IN OFFICE FOR MORE REFILLS  Modified Medications   No medications on file  Discontinued Medications   No medications on file    Allergies Allergies  Allergen Reactions   Contrast Media [Iodinated Contrast Media] Hives    Past Medical History Past Medical History:  Diagnosis Date   Acute urinary retention 05/04/2022   Allergy 2006 ?   Contrast dye   Arthritis 2016 ??   Knees and thumb   Cancer [facility])    cecum   Cataract 2021   Surgery scheduled July 2023   Colon cancer [facility]) 2003   Elevated liver function tests    Esophageal varices (HCC)    Heart murmur On file   Hemorrhage of gastrointestinal tract 05/04/2011   Hypertension  2021   Hypothyroidism    Iron deficiency anemia    Liver disease    chemotherapy complication, per pt, shunts placed to bypass liver   Malignant neoplasm of cecum (  HCC)    Portal hypertension (HCC)    Skin cancer 2019   Splenomegaly     Past Surgical History Past Surgical History:  Procedure Laterality Date   COLON SURGERY  2004   Cancer   COSMETIC SURGERY  2021   Skin cancer   ESOPHAGEAL VARICE LIGATION     EYE SURGERY     HEMICOLECTOMY  01/08/2003   IR RADIOLOGIST EVAL & MGMT  12/20/2020   IR RADIOLOGIST EVAL & MGMT  05/29/2021   LIVER SURGERY     shunts placed after chemo complication   SKIN FULL THICKNESS GRAFT N/A 09/12/2019   Procedure: debridement and FTSG to the nose from left upper arm;  Surgeon: Allena Napoleon, MD;  Location: [facility];  Service: Plastics;  Laterality: N/A;  2 hours, please   TIPS PROCEDURE      Family History family history includes Arthritis in her father and mother; Cancer in her maternal aunt; Diabetes in her father; Hearing loss in her mother; Heart disease in her father and mother; Hypertension in her mother; Miscarriages / [place] in her mother.  Social History Social History   Socioeconomic History   Marital status: Married    Spouse name: [name]   Number of children: 2   Years of education: Not on file   Highest education level: 12th grade  Occupational History   Occupation: Comptroller  Tobacco Use   Smoking status: Never   Smokeless tobacco: Never   Tobacco comments:    Never smoked  Vaping Use   Vaping status: Never Used  Substance and Sexual Activity   Alcohol use: Never   Drug use: Never   Sexual activity: Not Currently    Partners: Male  Other Topics Concern   Not on file  Social History Narrative   12/04/20   From: the area   Living: with husband, [name] (1994)   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      Family: 2 children Designer, fashion/clothing and Optometrist - 2 grandchildren - nearby      Enjoys:  read      Exercise: trying to get back to exercise   Diet: healthy, limits fast foods      Safety   Seat belts: Yes    Guns: Yes  and secure   Safe in relationships: Yes    Social Determinants of Health   Financial Resource Strain: Low Risk  (08/03/2023)   Overall Financial Resource Strain (CARDIA)    Difficulty of Paying Living Expenses: Not hard at all  Food Insecurity: No Food Insecurity (08/03/2023)   Hunger Vital Sign    Worried About Running Out of Food in the Last Year: Never true    Ran Out of Food in the Last Year: Never true  Transportation Needs: No Transportation Needs (08/03/2023)   PRAPARE - Administrator, Civil Service (Medical): No    Lack of Transportation (Non-Medical): No  Physical Activity: Inactive (08/03/2023)   Exercise Vital Sign    Days of Exercise per Week: 0 days    Minutes of Exercise per Session: 0 min  Stress: No Stress Concern Present (08/03/2023)   [employer] of Occupational Health - Occupational Stress Questionnaire    Feeling of Stress : Not at all  Social Connections: Socially Integrated (08/03/2023)   Social Connection and Isolation Panel [NHANES]    Frequency of Communication with Friends and Family: Twice a week    Frequency of Social Gatherings with Friends and Family:  Once a week    Attends Religious Services: More than 4 times per year    Active Member of Clubs or Organizations: Yes    Attends Club or Organization Meetings: More than 4 times per year    Marital Status: Married  Intimate Partner Violence: Not At Risk (04/22/2023)   Humiliation, Afraid, Rape, and Kick questionnaire    Fear of Current or Ex-Partner: No    Emotionally Abused: No    Physically Abused: No    Sexually Abused: No    Lab Results  Component Value Date   CHOL 154 09/09/2023   Lab Results  Component Value Date   HDL 64.90 09/09/2023   Lab Results  Component Value Date   LDLCALC 75 09/09/2023   Lab Results  Component Value Date   TRIG  69.0 09/09/2023   Lab Results  Component Value Date   CHOLHDL 2 09/09/2023   Lab Results  Component Value Date   CREATININE 0.90 08/10/2023   Lab Results  Component Value Date   GFR 64.01 08/10/2023      Component Value Date/Time   NA 139 08/10/2023 0946   NA 143 03/19/2023 1532   NA 143 01/08/2015 1547   K 4.7 08/10/2023 0946   K 4.1 01/08/2015 1547   CL 104 08/10/2023 0946   CL 109 (H) 11/28/2012 1555   CO2 30 08/10/2023 0946   CO2 25 01/08/2015 1547   GLUCOSE 82 08/10/2023 0946   GLUCOSE 81 01/08/2015 1547   GLUCOSE 72 11/28/2012 1555   BUN 24 (H) 08/10/2023 0946   BUN 14 03/19/2023 1532   BUN 11.6 01/08/2015 1547   CREATININE 0.90 08/10/2023 0946   CREATININE 0.8 01/08/2015 1547   CALCIUM 9.8 08/10/2023 0946   CALCIUM 8.8 01/08/2015 1547   PROT 6.8 08/10/2023 0946   PROT 6.5 03/19/2023 1532   PROT 7.0 01/08/2015 1547   ALBUMIN 3.2 (L) 08/10/2023 0946   ALBUMIN 3.9 03/19/2023 1532   ALBUMIN 3.5 01/08/2015 1547   AST 75 (H) 08/10/2023 0946   AST 43 (H) 01/08/2015 1547   ALT 51 (H) 08/10/2023 0946   ALT 26 01/08/2015 1547   ALKPHOS 183 (H) 08/10/2023 0946   ALKPHOS 98 01/08/2015 1547   BILITOT 1.1 08/10/2023 0946   BILITOT 1.1 03/19/2023 1532   BILITOT 0.76 01/08/2015 1547   GFRNONAA >60 04/27/2023 0437   GFRAA >60 08/17/2020 0135      Latest Ref Rng & Units 08/10/2023    9:46 AM 08/04/2023    9:16 AM 05/05/2023   12:03 PM  BMP  Glucose 70 - 99 mg/dL 82  55  88   BUN 6 - 23 mg/dL 24  23  13    Creatinine 0.40 - 1.20 mg/dL 4.03  4.74  2.59   Sodium 135 - 145 mEq/L 139  140  139   Potassium 3.5 - 5.1 mEq/L 4.7  4.9  3.9   Chloride 96 - 112 mEq/L 104  105  102   CO2 19 - 32 mEq/L 30  30  28    Calcium 8.4 - 10.5 mg/dL 9.8  9.6  9.1        Component Value Date/Time   WBC 3.4 (L) 08/10/2023 0946   RBC 3.20 (L) 08/10/2023 0946   HGB 10.9 (L) 08/10/2023 0946   HGB 11.1 04/02/2023 1205   HGB 12.7 01/08/2015 1546   HCT 33.0 (L) 08/10/2023 0946   HCT 32.3  (L) 04/02/2023 1205   HCT 39.7 01/08/2015 1546  PLT 80.0 (L) 08/10/2023 0946   PLT 98 (LL) 04/02/2023 1205   MCV 102.9 (H) 08/10/2023 0946   MCV 99 (H) 04/02/2023 1205   MCV 97.8 01/08/2015 1546   MCH 34.3 (H) 04/27/2023 0437   MCHC 33.1 08/10/2023 0946   RDW 17.3 (H) 08/10/2023 0946   RDW 15.8 (H) 04/02/2023 1205   RDW 13.4 01/08/2015 1546   LYMPHSABS 0.7 08/04/2023 0916   LYMPHSABS 1.2 01/08/2015 1546   MONOABS 0.3 08/04/2023 0916   MONOABS 0.3 01/08/2015 1546   EOSABS 0.1 08/04/2023 0916   EOSABS 0.2 01/08/2015 1546   BASOSABS 0.0 08/04/2023 0916   BASOSABS 0.0 01/08/2015 1546   Lab Results  Component Value Date   TSH 0.20 (L) 09/09/2023   TSH 1.260 02/25/2023   TSH 0.98 01/04/2023   FREET4 0.88 01/15/2021   FREET4 0.79 08/01/2020   FREET4 0.48 (L) 12/25/2010         Parts of this note may have been dictated using voice recognition software. There may be variances in spelling and vocabulary which are unintentional. Not all errors are proofread. Please notify the Thereasa Parkin if any discrepancies are noted or if the meaning of any statement is not clear.

## 2023-10-26 ENCOUNTER — Ambulatory Visit: Payer: Medicare Other

## 2023-10-26 ENCOUNTER — Other Ambulatory Visit: Payer: Medicare Other

## 2023-10-26 DIAGNOSIS — E2740 Unspecified adrenocortical insufficiency: Secondary | ICD-10-CM | POA: Diagnosis not present

## 2023-10-26 MED ORDER — COSYNTROPIN 0.25 MG IJ SOLR
0.2500 mg | Freq: Once | INTRAMUSCULAR | Status: AC
  Administered 2023-10-26: 0.25 mg via INTRAVENOUS

## 2023-10-26 NOTE — Progress Notes (Signed)
After obtaining consent, and per orders of Dr. Roosevelt Locks, injection of Cosyntropin given by Pollie Meyer. Patient instructed to remain in clinic for 20 minutes afterwards, and to report any adverse reaction to me immediately.

## 2023-10-27 LAB — CORTISOL, 3 SPECIMEN
SPECIMEN 2: 25.6 ug/dL
SPECIMEN 3: 27 ug/dL
Specimen: 11.6 ug/dL
Time 2: 855
Time 3: 928
Time: 815

## 2023-11-03 ENCOUNTER — Ambulatory Visit
Admission: RE | Admit: 2023-11-03 | Discharge: 2023-11-03 | Disposition: A | Discharge status: Home / Self Care | Payer: Medicare Other | Source: Ambulatory Visit | Attending: Nurse Practitioner | Admitting: Nurse Practitioner

## 2023-11-03 DIAGNOSIS — K7469 Other cirrhosis of liver: Secondary | ICD-10-CM

## 2023-11-03 DIAGNOSIS — K8020 Calculus of gallbladder without cholecystitis without obstruction: Secondary | ICD-10-CM | POA: Diagnosis not present

## 2023-11-03 DIAGNOSIS — K7460 Unspecified cirrhosis of liver: Secondary | ICD-10-CM | POA: Diagnosis not present

## 2023-11-08 ENCOUNTER — Ambulatory Visit
Admission: RE | Admit: 2023-11-08 | Discharge: 2023-11-08 | Disposition: A | Discharge status: Home / Self Care | Payer: Medicare Other | Source: Ambulatory Visit | Attending: Nurse Practitioner

## 2023-11-08 DIAGNOSIS — Z1231 Encounter for screening mammogram for malignant neoplasm of breast: Secondary | ICD-10-CM

## 2023-11-24 DIAGNOSIS — K7682 Hepatic encephalopathy: Secondary | ICD-10-CM

## 2023-11-24 HISTORY — DX: Hepatic encephalopathy: K76.82

## 2023-12-06 ENCOUNTER — Encounter: Payer: Self-pay | Admitting: Family Medicine

## 2023-12-06 ENCOUNTER — Telehealth: Payer: Self-pay

## 2023-12-06 ENCOUNTER — Ambulatory Visit (INDEPENDENT_AMBULATORY_CARE_PROVIDER_SITE_OTHER): Payer: Medicare Other | Admitting: Family Medicine

## 2023-12-06 VITALS — BP 100/60 | HR 68 | Temp 97.5°F | Ht 62.25 in | Wt 151.1 lb

## 2023-12-06 DIAGNOSIS — N289 Disorder of kidney and ureter, unspecified: Secondary | ICD-10-CM

## 2023-12-06 DIAGNOSIS — K7682 Hepatic encephalopathy: Secondary | ICD-10-CM

## 2023-12-06 DIAGNOSIS — R4182 Altered mental status, unspecified: Secondary | ICD-10-CM | POA: Diagnosis not present

## 2023-12-06 DIAGNOSIS — E860 Dehydration: Secondary | ICD-10-CM

## 2023-12-06 DIAGNOSIS — R410 Disorientation, unspecified: Secondary | ICD-10-CM

## 2023-12-06 DIAGNOSIS — K7469 Other cirrhosis of liver: Secondary | ICD-10-CM

## 2023-12-06 DIAGNOSIS — K7460 Unspecified cirrhosis of liver: Secondary | ICD-10-CM

## 2023-12-06 DIAGNOSIS — D696 Thrombocytopenia, unspecified: Secondary | ICD-10-CM | POA: Diagnosis not present

## 2023-12-06 DIAGNOSIS — J3489 Other specified disorders of nose and nasal sinuses: Secondary | ICD-10-CM

## 2023-12-06 DIAGNOSIS — D709 Neutropenia, unspecified: Secondary | ICD-10-CM | POA: Diagnosis not present

## 2023-12-06 LAB — HEPATIC FUNCTION PANEL
ALT: 70 U/L — ABNORMAL HIGH (ref 0–35)
AST: 113 U/L — ABNORMAL HIGH (ref 0–37)
Albumin: 3.4 g/dL — ABNORMAL LOW (ref 3.5–5.2)
Alkaline Phosphatase: 215 U/L — ABNORMAL HIGH (ref 39–117)
Bilirubin, Direct: 0.3 mg/dL (ref 0.0–0.3)
Total Bilirubin: 1.4 mg/dL — ABNORMAL HIGH (ref 0.2–1.2)
Total Protein: 7.1 g/dL (ref 6.0–8.3)

## 2023-12-06 LAB — POC COVID19 BINAXNOW: SARS Coronavirus 2 Ag: NEGATIVE

## 2023-12-06 LAB — POCT URINE DIPSTICK
Bilirubin, UA: NEGATIVE
Blood, UA: NEGATIVE
Glucose, UA: NEGATIVE mg/dL
Leukocytes, UA: NEGATIVE
Nitrite, UA: NEGATIVE
POC PROTEIN,UA: NEGATIVE
Spec Grav, UA: 1.015 (ref 1.010–1.025)
Urobilinogen, UA: 0.2 U/dL
pH, UA: 7 (ref 5.0–8.0)

## 2023-12-06 LAB — CBC WITH DIFFERENTIAL/PLATELET
Basophils Absolute: 0 10*3/uL (ref 0.0–0.1)
Basophils Relative: 0.5 % (ref 0.0–3.0)
Eosinophils Absolute: 0.1 10*3/uL (ref 0.0–0.7)
Eosinophils Relative: 2.1 % (ref 0.0–5.0)
HCT: 36.9 % (ref 36.0–46.0)
Hemoglobin: 12.2 g/dL (ref 12.0–15.0)
Lymphocytes Relative: 27.8 % (ref 12.0–46.0)
Lymphs Abs: 0.8 10*3/uL (ref 0.7–4.0)
MCHC: 33.2 g/dL (ref 30.0–36.0)
MCV: 103.1 fL — ABNORMAL HIGH (ref 78.0–100.0)
Monocytes Absolute: 0.3 10*3/uL (ref 0.1–1.0)
Monocytes Relative: 10.2 % (ref 3.0–12.0)
Neutro Abs: 1.7 10*3/uL (ref 1.4–7.7)
Neutrophils Relative %: 59.4 % (ref 43.0–77.0)
Platelets: 78 10*3/uL — ABNORMAL LOW (ref 150.0–400.0)
RBC: 3.58 Mil/uL — ABNORMAL LOW (ref 3.87–5.11)
RDW: 16.4 % — ABNORMAL HIGH (ref 11.5–15.5)
WBC: 2.9 10*3/uL — ABNORMAL LOW (ref 4.0–10.5)

## 2023-12-06 LAB — POC INFLUENZA A&B (BINAX/QUICKVUE)
Influenza A, POC: NEGATIVE
Influenza B, POC: NEGATIVE

## 2023-12-06 LAB — BASIC METABOLIC PANEL
BUN: 33 mg/dL — ABNORMAL HIGH (ref 6–23)
CO2: 30 meq/L (ref 19–32)
Calcium: 10 mg/dL (ref 8.4–10.5)
Chloride: 106 meq/L (ref 96–112)
Creatinine, Ser: 1.25 mg/dL — ABNORMAL HIGH (ref 0.40–1.20)
GFR: 43.06 mL/min — ABNORMAL LOW (ref >60.00)
Glucose, Bld: 72 mg/dL (ref 70–99)
Potassium: 5.3 meq/L — ABNORMAL HIGH (ref 3.5–5.1)
Sodium: 140 meq/L (ref 135–145)

## 2023-12-06 LAB — AMMONIA: Ammonia: 46 umol/L — ABNORMAL HIGH (ref 11–35)

## 2023-12-06 NOTE — Telephone Encounter (Signed)
 [name]

## 2023-12-06 NOTE — Progress Notes (Signed)
 [name] [name], MD, CAQ Sports Medicine [facility] [address] [place], [address]  Phone: [phone]  FAX: [phone]  [name] - 73 y.o. female  MRN [id number]  Date of Birth: 03/31/1951  Date: 12/06/2023  PCP: [name], NP  Referral: [name], NP  Chief Complaint  Patient presents with   Altered Mental Status   Subjective:   [name] is a 73 y.o. very pleasant female patient with Body mass index is 27.42 kg/m. who presents with the following:  Acute altered mental status: I actually saw the patient in May 2024 for some severe hepatic encephalopathy that required hospitalization for an extended period of time.  Cirrhosis with intermittent encephalopathy -persistently elevated LFTs in the setting of nonalcoholic cirrhosis. Question possible UTI -husband does note that she has some altered mental status with UTI in the past.  Shaking now -she does have some mild tremor When gets infection and encephalopathy, some difficult walking and pulling on her husband. Saturday night was cooking and got confused - husband helped  Sleeping a lot this weekend Did not go to urgent care yesterday - ok yest afternoon.  Triage disposition was to go to the emergency room.  Enceph not that often - has tried lactulose , and it has not gotten better.  She has had some relatively stringy bowel movements over the last couple of days 4 tp three times a day She has not been drinking nearly as much as she normally would.  Will struggle for words No slurred speech Walking is uneasy - normal with encephalopathy  Snotty, eye congestion No ST, headache, ear ache Sleeping a lot this weekend  No arthralgia or myalgia  Cirrhosis from chemotherapy  This weekend 4 tablespoons three times a day - stringy BM yesterday  Review of Systems is noted in the HPI, as appropriate  Patient Active Problem List   Diagnosis Date Noted    Preventative health care 09/09/2023   Lower extremity edema 08/10/2023   Cellulitis of both lower extremities 08/04/2023   Altered mental status 08/04/2023   Bradycardia 05/07/2023   Adrenal insufficiency (HCC) 03/19/2023   Protein-calorie malnutrition, severe (HCC) 02/26/2023   Weight loss 02/24/2023   Bowel habit changes 02/24/2023   Generalized abdominal pain 02/24/2023   Hospital discharge follow-up 02/24/2023   Weakness 01/04/2023   Frequent UTI 05/18/2022   Anemia 05/18/2022   AKI (acute kidney injury) (HCC) 04/30/2022   Tachycardia-bradycardia syndrome (HCC) 02/05/2022   Hepatic encephalopathy (HCC) 02/03/2022   Basal cell carcinoma (BCC) 05/06/2021   Cecal cancer (HCC) 05/06/2021   Hypertension 05/06/2021   Decompensated liver cirrhosis with portal HTN and gastric varices 12/04/2020   Essential hypertension 08/12/2020   Murmur, cardiac 03/19/2020   Hypothyroidism 06/30/2019   History of basal cell cancer 06/30/2019   Hx of colon cancer, stage III 11/30/2011   Esophageal varices (HCC) 06/12/2010   Portal hypertension (HCC) 01/23/2008    Past Medical History:  Diagnosis Date   Acute urinary retention 05/04/2022   Allergy 2006 ?   Contrast dye   Arthritis 2016 ??   Knees and thumb   Cancer [facility])    cecum   Cataract 2021   Surgery scheduled July 2023   Colon cancer [facility]) 2003   Elevated liver function tests    Esophageal varices (HCC)    Heart murmur On file   Hemorrhage of gastrointestinal tract 05/04/2011   Hypertension 2021   Hypothyroidism  Iron deficiency anemia    Liver disease    chemotherapy complication, per pt, shunts placed to bypass liver   Malignant neoplasm of cecum (HCC)    Portal hypertension (HCC)    Skin cancer 2019   Splenomegaly     Past Surgical History:  Procedure Laterality Date   COLON SURGERY  2004   Cancer   COSMETIC SURGERY  2021   Skin cancer   ESOPHAGEAL VARICE LIGATION     EYE SURGERY     HEMICOLECTOMY  01/08/2003    IR RADIOLOGIST EVAL & MGMT  12/20/2020   IR RADIOLOGIST EVAL & MGMT  05/29/2021   LIVER SURGERY     shunts placed after chemo complication   SKIN FULL THICKNESS GRAFT N/A 09/12/2019   Procedure: debridement and FTSG to the nose from left upper arm;  Surgeon: Elisabeth Craig RAMAN, MD;  Location: [facility];  Service: Plastics;  Laterality: N/A;  2 hours, please   TIPS PROCEDURE      Family History  Problem Relation Age of Onset   Arthritis Mother    Hearing loss Mother    Heart disease Mother    Hypertension Mother    Miscarriages / Stillbirths Mother    Arthritis Father    Diabetes Father    Heart disease Father    Cancer Maternal Aunt    Breast cancer Neg Hx    Colon cancer Neg Hx    Esophageal cancer Neg Hx    Pancreatic cancer Neg Hx    Stomach cancer Neg Hx     Social History   Social History Narrative   12/04/20   From: the area   Living: with husband, Valere (1994)   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 CV: RRR, no m/g/r  PULM: Normal respiratory rate, no accessory muscle use. No wheezes, crackles or rhonchi  ABD: S, NT, ND, + BS, No rebound, No HSM  PSYCH: Normally interactive -mildly slowed interaction and answering of questions.  Neuro: CN 2-12 grossly intact. PERRLA. EOMI. Sensation intact throughout. Str 5/5 all extremities. DTR 2+. No clonus. A and o x 4. Romberg neg. Finger nose neg. Heel -shin neg.    Laboratory and Imaging Data:  Results for orders placed or performed in visit on 12/06/23  POCT URINE DIPSTICK   Collection Time: 12/06/23 11:17 AM  Result Value Ref Range   Color, UA straw (A) yellow   Clarity, UA clear clear   Glucose, UA negative negative mg/dL   Bilirubin, UA negative negative   Ketones, POC UA trace (5) (A) negative mg/dL   Spec Grav, UA 8.984 8.989 - 1.025   Blood, UA negative negative   pH, UA 7.0 5.0 - 8.0  POC PROTEIN,UA negative negative, trace   Urobilinogen, UA 0.2 0.2 or 1.0 E.U./dL   Nitrite, UA Negative Negative   Leukocytes, UA Negative Negative  POC COVID-19   Collection Time: 12/06/23 11:57 AM  Result Value Ref Range   SARS Coronavirus 2 Ag Negative Negative  POC Influenza A&B (Binax test)   Collection Time: 12/06/23 11:57 AM  Result Value Ref Range   Influenza A, POC Negative Negative   Influenza B, POC Negative Negative  Basic metabolic panel   Collection Time: 12/06/23 12:10 PM  Result Value Ref Range   Sodium 140 135 - 145 mEq/L   Potassium 5.3 (H) 3.5 - 5.1 mEq/L   Chloride 106 96 - 112 mEq/L   CO2 30 19 - 32 mEq/L   Glucose, Bld 72 70 - 99 mg/dL   BUN 33 (H) 6 - 23 mg/dL   Creatinine, Ser 8.74 (H) 0.40 - 1.20 mg/dL   GFR 56.93 (L) >39.99 mL/min   Calcium  10.0 8.4 - 10.5 mg/dL  CBC with Differential/Platelet   Collection Time: 12/06/23 12:10 PM  Result Value Ref Range   WBC 2.9 (L) 4.0 - 10.5 K/uL   RBC 3.58 (L) 3.87 - 5.11 Mil/uL   Hemoglobin 12.2 12.0 - 15.0 g/dL   HCT 63.0 63.9 - 53.9 %   MCV 103.1 (H) 78.0 - 100.0 fl   MCHC 33.2 30.0 - 36.0 g/dL   RDW 83.5 (H) 88.4 - 84.4 %   Platelets 78.0 (L) 150.0 - 400.0 K/uL   Neutrophils Relative % 59.4 43.0 - 77.0 %   Lymphocytes Relative 27.8 12.0 - 46.0 %   Monocytes Relative 10.2 3.0 - 12.0 %   Eosinophils Relative 2.1 0.0 - 5.0 %   Basophils Relative 0.5 0.0 - 3.0 %   Neutro Abs 1.7 1.4 - 7.7 K/uL   Lymphs Abs 0.8 0.7 - 4.0 K/uL   Monocytes Absolute 0.3 0.1 - 1.0 K/uL    Eosinophils Absolute 0.1 0.0 - 0.7 K/uL   Basophils Absolute 0.0 0.0 - 0.1 K/uL  Hepatic function panel   Collection Time: 12/06/23 12:10 PM  Result Value Ref Range   Total Bilirubin 1.4 (H) 0.2 - 1.2 mg/dL   Bilirubin, Direct 0.3 0.0 - 0.3 mg/dL   Alkaline Phosphatase 215 (H) 39 - 117 U/L   AST 113 (H) 0 - 37 U/L   ALT 70 (H) 0 - 35 U/L   Total Protein 7.1 6.0 - 8.3 g/dL   Albumin  3.4 (L) 3.5 - 5.2 g/dL  Ammonia   Collection Time: 12/06/23 12:10 PM  Result Value Ref Range   Ammonia 46 (H) 11 - 35 umol/L     Assessment and Plan:     ICD-10-CM   1. Acute hepatic encephalopathy (HCC)  K76.82     2. Confusion  R41.0 POCT URINE DIPSTICK    3. Altered mental status, unspecified altered mental status type  R41.82 Basic metabolic panel    CBC with Differential/Platelet    Hepatic function panel    Ammonia    4. Other cirrhosis of liver (HCC)  K74.69 Basic metabolic panel    CBC with Differential/Platelet    Hepatic function panel    Ammonia    5. Rhinorrhea  J34.89 POC COVID-19    POC Influenza A&B (Binax test)    6. Acute renal insufficiency  N28.9     7. Thrombocytopenia (HCC)  D69.6     8. Cirrhosis, nonalcoholic (HCC)  K74.60     9. Neutropenia,  unspecified type (HCC)  D70.9     10. Dehydration  E86.0      Total encounter time: 40 minutes. This includes total time spent on the day of encounter.  Complicated medical case, history, chart review, discussion and phone follow-up.  I think that the patient is having some hepatic encephalopathy there is relatively mild or at least not severe at this point.  Ammonia level is 46.   She does have a history of chronic nonalcoholic cirrhosis and has had multiple bouts of hepatic encephalopathy in the past.  Normally this is managed with some lactulose , she has not had a good BM yesterday after she left the office.  I am going to have the patient's husband increase the lactulose  dosing to 4 times a day.  Most likely  underlying causative etiology would be some acute renal insufficiency, and she has had decrease in her creatinine and GFR along with increased BUN.  Likely dehydration driven.  She also has a runny nose, and underlying viral syndrome also possible etiology.  COVID and flu are negative.  Neutropenia, however white blood cell count is stable and white blood cell count is 2.9.  She also has some chronic thrombocytopenia, and this is unchanged with a platelet level of 78 very likely cirrhosis driven.  Alkaline phosphatase, AST, and ALT are also elevated from baseline, but these have been persistently elevated for an extended period of time.  I think that this is reasonable to manage at home.  I am going to have the patient's husband push p.o. fluids for dehydration and increase lactulose  for the encephalopathy.  I gave them strict precautions, and if she worsens, she will possibly need ER assessment at the least.  I did speak to the husband and the patient about her labs yesterday, and at that point she had just had a very large bowel movement.  Optimistic that this will correct with lactulose .  She has a follow-up scheduled for Friday, and I would recommend repeating the patient's BMP, CBC, and hepatic function panel.  I will alert the patient's primary care provider.  Medication Management during today's office visit: No orders of the defined types were placed in this encounter.  Medications Discontinued During This Encounter  Medication Reason   hydrocortisone  (CORTEF ) 5 MG tablet Completed Course   furosemide  (LASIX ) 20 MG tablet Duplicate   spironolactone  (ALDACTONE ) 25 MG tablet Dose change    Orders placed today for conditions managed today: Orders Placed This Encounter  Procedures   Basic metabolic panel   CBC with Differential/Platelet   Hepatic function panel   Ammonia   POCT URINE DIPSTICK   POC COVID-19   POC Influenza A&B (Binax test)    Disposition: No follow-ups on  file.  Dragon [employer] One speech-to-text software was used for transcription in this dictation.  Possible transcriptional errors can occur using Animal nutritionist.   Signed,  Jacques DASEN. Candida Vetter, MD   Outpatient Encounter Medications as of 12/06/2023  Medication Sig   acidophilus (RISAQUAD) CAPS capsule Take 1 capsule by mouth daily.   CRANBERRY PO Take 1 tablet by mouth 2 (two) times daily.   estradiol (ESTRACE) 0.1 MG/GM vaginal cream Place 1 Applicatorful vaginally every other day.   furosemide  (LASIX ) 20 MG tablet Take 20-40 mg by mouth daily.   lactulose  (CHRONULAC ) 10 GM/15ML solution Take 45 mLs (30 g total) by mouth 3 (three) times daily.   levOCARNitine  (CARNITOR ) 330 MG tablet Take 330 mg by mouth 3 (three) times daily.  liver oil-zinc  oxide (DESITIN) 40 % ointment Apply topically daily as needed for irritation.   Multiple Vitamin (MULTI-VITAMIN) tablet Take 1 tablet by mouth daily.   polyethylene glycol (MIRALAX  / GLYCOLAX ) 17 g packet Take 17 g by mouth daily as needed for mild constipation.   spironolactone  (ALDACTONE ) 25 MG tablet Take 50 mg by mouth daily.   SYNTHROID  75 MCG tablet TAKE 1 TABLET BY MOUTH DAILY BEFORE BREAKFAST. MUST BE SEEN IN OFFICE FOR MORE REFILLS   [DISCONTINUED] furosemide  (LASIX ) 20 MG tablet Take 1 tablet (20 mg total) by mouth daily. (Patient taking differently: Take 40 mg by mouth daily.)   [DISCONTINUED] hydrocortisone  (CORTEF ) 5 MG tablet TAKE 1 TABLET BY MOUTH 3 TIMES DAILY   [DISCONTINUED] spironolactone  (ALDACTONE ) 25 MG tablet Take 1 tablet (25 mg total) by mouth daily. (Patient taking differently: Take 50 mg by mouth daily.)   No facility-administered encounter medications on file as of 12/06/2023.

## 2023-12-06 NOTE — Telephone Encounter (Addendum)
 I spoke with pts husband (DPR signed) for few days pt has confusion; sometimes searches for words. Mr Landowski said this is typical for pt when has UTI. No other UTI symptoms; pt has no speech problems and no problems with use of extremities. ANASTACIA REINECKE has given pt lactolose but has not helped confusion.Mr Constantine request first available appt at [facility]. Scheduled appt with Dr Watt 12/06/23 at 11 am with UC & ED precautions and pts husband voiced understanding. Has not taken pts BP today due to pt is still sleeping. Sending note to Dr Watt and [place].

## 2023-12-10 ENCOUNTER — Ambulatory Visit (INDEPENDENT_AMBULATORY_CARE_PROVIDER_SITE_OTHER): Payer: Medicare Other | Admitting: Nurse Practitioner

## 2023-12-10 ENCOUNTER — Ambulatory Visit (INDEPENDENT_AMBULATORY_CARE_PROVIDER_SITE_OTHER)
Admission: RE | Admit: 2023-12-10 | Discharge: 2023-12-10 | Disposition: A | Discharge status: Home / Self Care | Payer: Medicare Other | Source: Ambulatory Visit | Attending: Nurse Practitioner

## 2023-12-10 VITALS — BP 118/70 | HR 67 | Temp 97.2°F | Ht 62.25 in | Wt 153.0 lb

## 2023-12-10 DIAGNOSIS — K7290 Hepatic failure, unspecified without coma: Secondary | ICD-10-CM | POA: Diagnosis not present

## 2023-12-10 DIAGNOSIS — K7682 Hepatic encephalopathy: Secondary | ICD-10-CM | POA: Diagnosis not present

## 2023-12-10 DIAGNOSIS — K5909 Other constipation: Secondary | ICD-10-CM

## 2023-12-10 DIAGNOSIS — K7469 Other cirrhosis of liver: Secondary | ICD-10-CM | POA: Diagnosis not present

## 2023-12-10 DIAGNOSIS — R944 Abnormal results of kidney function studies: Secondary | ICD-10-CM | POA: Insufficient documentation

## 2023-12-10 DIAGNOSIS — K5900 Constipation, unspecified: Secondary | ICD-10-CM | POA: Diagnosis not present

## 2023-12-10 NOTE — Assessment & Plan Note (Signed)
Last ammonia was 46. Has been doing lactulose. Lucid in office. Continue lactulose

## 2023-12-10 NOTE — Assessment & Plan Note (Signed)
Patient was seen recently with increased LFTs. Repeat today. She has been doing lactulose 4 tlb QID without great resolve. Pending KUB to assess constipation

## 2023-12-10 NOTE — Assessment & Plan Note (Signed)
Most recent GFR was decreased with increased creatinine. Has been doing approx 24oz of fluid encoraged 48oz. Pending BMP today

## 2023-12-10 NOTE — Patient Instructions (Signed)
Nice to see you today I will be in touch with the labs once I have them Follow up if you do not improve

## 2023-12-10 NOTE — Progress Notes (Signed)
Established Patient Office Visit  Subjective   Patient ID: [name], female    DOB: 08-Jan-1951  Age: 73 y.o. MRN: [id number]  Chief Complaint  Patient presents with   Follow-up    Liver and mental status recheck. Pt states that she thinks she's been doing well. Mentally she is fine today. But on Monday her mental state was not all the way there. Pt has not had a bowel movement. Pt is sleeping more than usual.     HPI  Decompensated liver failure: Patient is followed by the liver clinic [facility].  Patient was recently seen in clinic by colleague Hannah Beat, MD.  Patient was having concern for acute encephalopathy.  Ammonia was checked and it was 46.  Dr. Theodoro Grist from increased lactulose to 4 times daily dosing.  Other labs did include decreased GFR and increased serum creatinine with slight hyperkalemia.  CBC showed leukocytosis.  Patient is here for traditional follow-up but close follow-up given recent office visit with colleague.  States that she is moving bowels but not a normal BM. State the last time she had on 12/06/2023. Since then small amounts of stool. States that they did start miralax and has increased the lactulose to 4 tb QID. States yesterday that she did stay awake yesterday day. States that she did admit to not drinking enough fluids.  States that she will normally nap but she did sleep from 12-230. States that they did get up around 9.    States that she will have periods of time where she will wake up and be confused. That is the state in between full confusion and normal mentation per the spouses report    Review of Systems  Constitutional:  Negative for chills and fever.  Respiratory:  Negative for shortness of breath.   Cardiovascular:  Negative for chest pain.  Gastrointestinal:  Positive for constipation. Negative for abdominal pain, diarrhea, nausea and vomiting.  Neurological:  Positive for dizziness.      Objective:     BP 118/70   Pulse 67    Temp (!) 97.2 F (36.2 C) (Oral)   Ht 5' 2.25" (1.581 m)   Wt 153 lb (69.4 kg) Comment: pt weighed 148 lb at home  SpO2 97%   BMI 27.76 kg/m  BP Readings from Last 3 Encounters:  12/10/23 118/70  12/06/23 100/60  10/19/23 104/60   Wt Readings from Last 3 Encounters:  12/10/23 153 lb (69.4 kg)  12/06/23 151 lb 2 oz (68.5 kg)  10/19/23 156 lb 12.8 oz (71.1 kg)   SpO2 Readings from Last 3 Encounters:  12/10/23 97%  12/06/23 96%  10/19/23 96%      Physical Exam Vitals and nursing note reviewed.  Constitutional:      Appearance: Normal appearance.  Cardiovascular:     Rate and Rhythm: Normal rate and regular rhythm.     Heart sounds: Normal heart sounds.  Pulmonary:     Effort: Pulmonary effort is normal.     Breath sounds: Normal breath sounds.  Abdominal:     General: Bowel sounds are normal.  Musculoskeletal:     Cervical back: No rigidity.  Neurological:     General: No focal deficit present.     Mental Status: She is alert.     Deep Tendon Reflexes:     Reflex Scores:      Bicep reflexes are 2+ on the right side and 2+ on the left side.  Patellar reflexes are 2+ on the right side and 2+ on the left side.    Comments: Bilateral upper and lower extremity strength 5/5      No results found for any visits on 12/10/23.    The 10-year ASCVD risk score (Arnett DK, et al., 2019) is: 12.4%    Assessment & Plan:   Problem List Items Addressed This Visit       Digestive   Decompensated liver cirrhosis with portal HTN and gastric varices - Primary   Patient was seen recently with increased LFTs. Repeat today. She has been doing lactulose 4 tlb QID without great resolve. Pending KUB to assess constipation       Relevant Orders   Basic metabolic panel   CBC     Nervous and Auditory   Hepatic encephalopathy (HCC)   Last ammonia was 46. Has been doing lactulose. Lucid in office. Continue lactulose      Relevant Orders   Basic metabolic panel   CBC      Other   Other constipation   Pending KUB. Currently on lactulose and mira lax. Increase fluid intake      Relevant Orders   DG Abd 1 View   Decreased GFR   Most recent GFR was decreased with increased creatinine. Has been doing approx 24oz of fluid encoraged 48oz. Pending BMP today       Relevant Orders   Basic metabolic panel    Return if symptoms worsen or fail to improve.    Audria Nine, NP

## 2023-12-10 NOTE — Assessment & Plan Note (Signed)
Pending KUB. Currently on lactulose and mira lax. Increase fluid intake

## 2023-12-11 LAB — BASIC METABOLIC PANEL
BUN/Creatinine Ratio: 22 (calc) (ref 6–22)
BUN: 25 mg/dL (ref 7–25)
CO2: 27 mmol/L (ref 20–32)
Calcium: 10 mg/dL (ref 8.6–10.4)
Chloride: 101 mmol/L (ref 98–110)
Creat: 1.12 mg/dL — ABNORMAL HIGH (ref 0.60–1.00)
Glucose, Bld: 90 mg/dL (ref 65–99)
Potassium: 4.6 mmol/L (ref 3.5–5.3)
Sodium: 137 mmol/L (ref 135–146)

## 2023-12-11 LAB — CBC
HCT: 34.2 % — ABNORMAL LOW (ref 35.0–45.0)
Hemoglobin: 12.1 g/dL (ref 11.7–15.5)
MCH: 34.4 pg — ABNORMAL HIGH (ref 27.0–33.0)
MCHC: 35.4 g/dL (ref 32.0–36.0)
MCV: 97.2 fL (ref 80.0–100.0)
MPV: 12.4 fL (ref 7.5–12.5)
Platelets: 83 10*3/uL — ABNORMAL LOW (ref 140–400)
RBC: 3.52 10*6/uL — ABNORMAL LOW (ref 3.80–5.10)
RDW: 14.2 % (ref 11.0–15.0)
WBC: 3.4 10*3/uL — ABNORMAL LOW (ref 3.8–10.8)

## 2023-12-14 ENCOUNTER — Encounter: Payer: Self-pay | Admitting: Nurse Practitioner

## 2023-12-20 ENCOUNTER — Telehealth: Payer: Self-pay | Admitting: Nurse Practitioner

## 2023-12-20 ENCOUNTER — Ambulatory Visit: Payer: Self-pay | Admitting: Nurse Practitioner

## 2023-12-20 ENCOUNTER — Telehealth: Payer: Self-pay

## 2023-12-20 NOTE — Telephone Encounter (Signed)
Copied from CRM [id number]. Topic: Clinical - Medical Advice >> Dec 20, 2023 12:53 PM Fuller Mandril wrote: Reason for CRM: Patient husband called. States patient is not acting her normal self. Has been sleeping a lot. Finally had a bowel movement last night. Not in any pain or any other symptoms. He wants to know if he should be concerned or what provider suggests. Callback number: [phone]  Thank You.

## 2023-12-20 NOTE — Telephone Encounter (Signed)
Since she had a bowel movement hopefully that will improve. If he is concerned I am more than happy to see her in office

## 2023-12-20 NOTE — Telephone Encounter (Unsigned)
Copied from CRM [id number]. Topic: Clinical - Red Word Triage >> Dec 20, 2023  5:11 PM Clayton Bibles wrote: Red Word that prompted transfer to Nurse Triage: Husband is calling in about his wife.  Blood pressure is at 130/60 - which is high for her Pulse is at 83 and it is normally in the mid 60 Oxygen is 94 Temp is at 98.2 She has been in bed or sitting in chair all day. All weekend she slept a lot and not acting like herself.

## 2023-12-20 NOTE — Telephone Encounter (Signed)
Chief Complaint: Increased sleepiness Symptoms: see below Frequency: Today Pertinent Negatives: Patient denies n/a Disposition: [] ED /[] Urgent Care (no appt availability in office) / [x] Appointment(In office/virtual)/ []  [place] [facility]/ [] Home Care/ [] Refused Recommended Disposition /[] [facility]/ []  Follow-up with PCP Additional Notes: Patient's husband called in stating patient has been sleeping a little more than normal today. Patient's husband states that patient has been dealing with encepalopathy and high ammonia levels, on lactulose, and has been waiting for a large bowel movement to help. Patient had bowel movement last night, but today patient has been sleeping more than normal. Patient's husband states it is not abnormal for patient to rest frequently but she is not fully herself. Patient's husband states BP is 130/60, HR 83, O2 94, Temp 98.2. Patient is alert and oriented x 4, was able to verbalize where she is and what day it is. Patient was able to choose an appointment time and be aware of the conversation between this RN and husband. Virtual UC appointment created for tomorrow morning. Advised patient's husband to take patient to Emergency Department for further evaluation if lethargy increases, patient becomes disoriented, patient has new onset of weakness, patient is unarousable, or patient appears off from her baseline. Patient's husband verbalizes understanding.    Reason for Disposition  Taking a medicine that could cause weakness (e.g., blood pressure medications, diuretics)    Patient has high ammonia levels, on lactulose  Answer Assessment - Initial Assessment Questions 1. DESCRIPTION: "Describe how you are feeling."     Patient appears to be more lethargic and sleepy today than usual 2. SEVERITY: "How bad is it?"  "Can you stand and walk?"   - MILD (0-3): Feels weak or tired, but does not interfere with work, school or normal activities.   - MODERATE  (4-7): Able to stand and walk; weakness interferes with work, school, or normal activities.   - SEVERE (8-10): Unable to stand or walk; unable to do usual activities.     Taking frequent naps 3. ONSET: "When did these symptoms begin?" (e.g., hours, days, weeks, months)     Today 4. CAUSE: "What do you think is causing the weakness or fatigue?" (e.g., not drinking enough fluids, medical problem, trouble sleeping)     High ammonia levels  Protocols used: Weakness (Generalized) and Fatigue-A-AH

## 2023-12-21 ENCOUNTER — Emergency Department (HOSPITAL_COMMUNITY): Payer: Medicare Other

## 2023-12-21 ENCOUNTER — Telehealth: Payer: Medicare Other | Admitting: Physician Assistant

## 2023-12-21 ENCOUNTER — Inpatient Hospital Stay (HOSPITAL_COMMUNITY)
Admission: EM | Admit: 2023-12-21 | Discharge: 2023-12-25 | DRG: 064 | Disposition: A | Discharge status: Home / Self Care | Payer: Medicare Other | Attending: Family Medicine | Admitting: Family Medicine

## 2023-12-21 ENCOUNTER — Encounter (HOSPITAL_COMMUNITY): Payer: Self-pay | Admitting: Emergency Medicine

## 2023-12-21 DIAGNOSIS — Z1152 Encounter for screening for COVID-19: Secondary | ICD-10-CM

## 2023-12-21 DIAGNOSIS — N179 Acute kidney failure, unspecified: Secondary | ICD-10-CM | POA: Diagnosis not present

## 2023-12-21 DIAGNOSIS — E669 Obesity, unspecified: Secondary | ICD-10-CM | POA: Diagnosis present

## 2023-12-21 DIAGNOSIS — Z8501 Personal history of malignant neoplasm of esophagus: Secondary | ICD-10-CM

## 2023-12-21 DIAGNOSIS — E871 Hypo-osmolality and hyponatremia: Secondary | ICD-10-CM | POA: Diagnosis not present

## 2023-12-21 DIAGNOSIS — R059 Cough, unspecified: Secondary | ICD-10-CM | POA: Diagnosis not present

## 2023-12-21 DIAGNOSIS — I6389 Other cerebral infarction: Secondary | ICD-10-CM | POA: Diagnosis not present

## 2023-12-21 DIAGNOSIS — I6340 Cerebral infarction due to embolism of unspecified cerebral artery: Principal | ICD-10-CM | POA: Diagnosis present

## 2023-12-21 DIAGNOSIS — D72829 Elevated white blood cell count, unspecified: Secondary | ICD-10-CM | POA: Diagnosis present

## 2023-12-21 DIAGNOSIS — Z79899 Other long term (current) drug therapy: Secondary | ICD-10-CM | POA: Diagnosis not present

## 2023-12-21 DIAGNOSIS — R4182 Altered mental status, unspecified: Secondary | ICD-10-CM | POA: Diagnosis present

## 2023-12-21 DIAGNOSIS — Z91041 Radiographic dye allergy status: Secondary | ICD-10-CM | POA: Diagnosis not present

## 2023-12-21 DIAGNOSIS — R680 Hypothermia, not associated with low environmental temperature: Secondary | ICD-10-CM | POA: Diagnosis not present

## 2023-12-21 DIAGNOSIS — Z822 Family history of deafness and hearing loss: Secondary | ICD-10-CM

## 2023-12-21 DIAGNOSIS — Z9221 Personal history of antineoplastic chemotherapy: Secondary | ICD-10-CM

## 2023-12-21 DIAGNOSIS — K7682 Hepatic encephalopathy: Secondary | ICD-10-CM

## 2023-12-21 DIAGNOSIS — R197 Diarrhea, unspecified: Secondary | ICD-10-CM | POA: Diagnosis not present

## 2023-12-21 DIAGNOSIS — E861 Hypovolemia: Secondary | ICD-10-CM | POA: Diagnosis present

## 2023-12-21 DIAGNOSIS — I864 Gastric varices: Secondary | ICD-10-CM | POA: Diagnosis present

## 2023-12-21 DIAGNOSIS — R519 Headache, unspecified: Secondary | ICD-10-CM | POA: Diagnosis not present

## 2023-12-21 DIAGNOSIS — R29702 NIHSS score 2: Secondary | ICD-10-CM | POA: Diagnosis present

## 2023-12-21 DIAGNOSIS — G9341 Metabolic encephalopathy: Secondary | ICD-10-CM | POA: Diagnosis present

## 2023-12-21 DIAGNOSIS — I639 Cerebral infarction, unspecified: Secondary | ICD-10-CM | POA: Diagnosis not present

## 2023-12-21 DIAGNOSIS — M4856XA Collapsed vertebra, not elsewhere classified, lumbar region, initial encounter for fracture: Secondary | ICD-10-CM | POA: Diagnosis not present

## 2023-12-21 DIAGNOSIS — Z85828 Personal history of other malignant neoplasm of skin: Secondary | ICD-10-CM | POA: Diagnosis not present

## 2023-12-21 DIAGNOSIS — J90 Pleural effusion, not elsewhere classified: Secondary | ICD-10-CM | POA: Diagnosis not present

## 2023-12-21 DIAGNOSIS — Z87440 Personal history of urinary (tract) infections: Secondary | ICD-10-CM

## 2023-12-21 DIAGNOSIS — Z85038 Personal history of other malignant neoplasm of large intestine: Secondary | ICD-10-CM

## 2023-12-21 DIAGNOSIS — D6959 Other secondary thrombocytopenia: Secondary | ICD-10-CM | POA: Diagnosis present

## 2023-12-21 DIAGNOSIS — Z8261 Family history of arthritis: Secondary | ICD-10-CM

## 2023-12-21 DIAGNOSIS — I10 Essential (primary) hypertension: Secondary | ICD-10-CM | POA: Diagnosis present

## 2023-12-21 DIAGNOSIS — Z8249 Family history of ischemic heart disease and other diseases of the circulatory system: Secondary | ICD-10-CM

## 2023-12-21 DIAGNOSIS — D696 Thrombocytopenia, unspecified: Secondary | ICD-10-CM | POA: Diagnosis not present

## 2023-12-21 DIAGNOSIS — K766 Portal hypertension: Secondary | ICD-10-CM | POA: Diagnosis present

## 2023-12-21 DIAGNOSIS — R278 Other lack of coordination: Secondary | ICD-10-CM | POA: Diagnosis present

## 2023-12-21 DIAGNOSIS — I959 Hypotension, unspecified: Secondary | ICD-10-CM | POA: Diagnosis present

## 2023-12-21 DIAGNOSIS — R509 Fever, unspecified: Secondary | ICD-10-CM | POA: Diagnosis not present

## 2023-12-21 DIAGNOSIS — Z833 Family history of diabetes mellitus: Secondary | ICD-10-CM

## 2023-12-21 DIAGNOSIS — Z79890 Hormone replacement therapy (postmenopausal): Secondary | ICD-10-CM

## 2023-12-21 DIAGNOSIS — K7290 Hepatic failure, unspecified without coma: Secondary | ICD-10-CM | POA: Diagnosis not present

## 2023-12-21 DIAGNOSIS — Z9049 Acquired absence of other specified parts of digestive tract: Secondary | ICD-10-CM

## 2023-12-21 DIAGNOSIS — E039 Hypothyroidism, unspecified: Secondary | ICD-10-CM | POA: Diagnosis present

## 2023-12-21 DIAGNOSIS — R471 Dysarthria and anarthria: Secondary | ICD-10-CM | POA: Diagnosis present

## 2023-12-21 DIAGNOSIS — D539 Nutritional anemia, unspecified: Secondary | ICD-10-CM | POA: Diagnosis not present

## 2023-12-21 DIAGNOSIS — R109 Unspecified abdominal pain: Secondary | ICD-10-CM | POA: Diagnosis not present

## 2023-12-21 DIAGNOSIS — K7460 Unspecified cirrhosis of liver: Secondary | ICD-10-CM | POA: Diagnosis present

## 2023-12-21 DIAGNOSIS — R2689 Other abnormalities of gait and mobility: Secondary | ICD-10-CM | POA: Diagnosis present

## 2023-12-21 DIAGNOSIS — R54 Age-related physical debility: Secondary | ICD-10-CM | POA: Diagnosis present

## 2023-12-21 LAB — COMPREHENSIVE METABOLIC PANEL
ALT: 55 U/L — ABNORMAL HIGH (ref 0–44)
AST: 72 U/L — ABNORMAL HIGH (ref 15–41)
Albumin: 3 g/dL — ABNORMAL LOW (ref 3.5–5.0)
Alkaline Phosphatase: 213 U/L — ABNORMAL HIGH (ref 38–126)
Anion gap: 12 (ref 5–15)
BUN: 22 mg/dL (ref 8–23)
CO2: 23 mmol/L (ref 22–32)
Calcium: 9 mg/dL (ref 8.9–10.3)
Chloride: 95 mmol/L — ABNORMAL LOW (ref 98–111)
Creatinine, Ser: 0.99 mg/dL (ref 0.44–1.00)
GFR, Estimated: >60 mL/min (ref >60)
Glucose, Bld: 103 mg/dL — ABNORMAL HIGH (ref 70–99)
Potassium: 4.1 mmol/L (ref 3.5–5.1)
Sodium: 130 mmol/L — ABNORMAL LOW (ref 135–145)
Total Bilirubin: 3.1 mg/dL — ABNORMAL HIGH (ref 0.0–1.2)
Total Protein: 7.2 g/dL (ref 6.5–8.1)

## 2023-12-21 LAB — RESP PANEL BY RT-PCR (RSV, FLU A&B, COVID)  RVPGX2
Influenza A by PCR: NEGATIVE
Influenza B by PCR: NEGATIVE
Resp Syncytial Virus by PCR: NEGATIVE
SARS Coronavirus 2 by RT PCR: NEGATIVE

## 2023-12-21 LAB — CBC
HCT: 38.4 % (ref 36.0–46.0)
Hemoglobin: 13 g/dL (ref 12.0–15.0)
MCH: 33.9 pg (ref 26.0–34.0)
MCHC: 33.9 g/dL (ref 30.0–36.0)
MCV: 100 fL (ref 80.0–100.0)
Platelets: 128 10*3/uL — ABNORMAL LOW (ref 150–400)
RBC: 3.84 MIL/uL — ABNORMAL LOW (ref 3.87–5.11)
RDW: 15.9 % — ABNORMAL HIGH (ref 11.5–15.5)
WBC: 13.6 10*3/uL — ABNORMAL HIGH (ref 4.0–10.5)
nRBC: 0 % (ref 0.0–0.2)

## 2023-12-21 LAB — LIPASE, BLOOD: Lipase: 37 U/L (ref 11–51)

## 2023-12-21 LAB — AMMONIA: Ammonia: 87 umol/L — ABNORMAL HIGH (ref 9–35)

## 2023-12-21 MED ORDER — ACETAMINOPHEN 650 MG RE SUPP: 325.0000 mg | Freq: Four times a day (QID) | RECTAL | Status: DC | PRN

## 2023-12-21 MED ORDER — ONDANSETRON HCL 4 MG PO TABS: 4.0000 mg | ORAL_TABLET | Freq: Four times a day (QID) | ORAL | Status: DC | PRN

## 2023-12-21 MED ORDER — ONDANSETRON HCL 4 MG/2ML IJ SOLN
4.0000 mg | Freq: Four times a day (QID) | INTRAMUSCULAR | Status: DC | PRN
  Administered 2023-12-22: 4 mg via INTRAVENOUS
  Filled 2023-12-21: qty 2

## 2023-12-21 MED ORDER — LACTULOSE 10 GM/15ML PO SOLN
30.0000 g | Freq: Once | ORAL | Status: AC
  Administered 2023-12-21: 30 g via ORAL
  Filled 2023-12-21: qty 60

## 2023-12-21 MED ORDER — POLYETHYLENE GLYCOL 3350 17 G PO PACK: 17.0000 g | PACK | Freq: Every day | ORAL | Status: DC | PRN

## 2023-12-21 MED ORDER — ONDANSETRON HCL 4 MG/2ML IJ SOLN
4.0000 mg | Freq: Once | INTRAMUSCULAR | Status: AC
  Administered 2023-12-21: 4 mg via INTRAVENOUS

## 2023-12-21 MED ORDER — LEVOCARNITINE 1 GM/10ML PO SOLN
330.0000 mg | Freq: Three times a day (TID) | ORAL | Status: DC
  Administered 2023-12-21 – 2023-12-25 (×11): 330 mg via ORAL
  Filled 2023-12-21 (×12): qty 3.3

## 2023-12-21 MED ORDER — LEVOTHYROXINE SODIUM 75 MCG PO TABS
75.0000 ug | ORAL_TABLET | Freq: Every day | ORAL | Status: DC
  Administered 2023-12-22 – 2023-12-23 (×2): 75 ug via ORAL
  Filled 2023-12-21 (×2): qty 1

## 2023-12-21 MED ORDER — LEVOCARNITINE 330 MG PO TABS: 330.0000 mg | ORAL_TABLET | Freq: Three times a day (TID) | ORAL | Status: DC

## 2023-12-21 MED ORDER — ONDANSETRON HCL 4 MG/2ML IJ SOLN
4.0000 mg | Freq: Once | INTRAMUSCULAR | Status: AC
  Administered 2023-12-21: 4 mg via INTRAVENOUS
  Filled 2023-12-21: qty 2

## 2023-12-21 MED ORDER — ONDANSETRON 4 MG PO TBDP: 4.0000 mg | ORAL_TABLET | Freq: Once | ORAL | Status: DC | PRN

## 2023-12-21 MED ORDER — ACETAMINOPHEN 325 MG PO TABS
325.0000 mg | ORAL_TABLET | Freq: Four times a day (QID) | ORAL | Status: DC | PRN
  Administered 2023-12-24 – 2023-12-25 (×2): 325 mg via ORAL
  Filled 2023-12-21 (×2): qty 1

## 2023-12-21 MED ORDER — KCL IN DEXTROSE-NACL 20-5-0.9 MEQ/L-%-% IV SOLN
INTRAVENOUS | Status: AC
  Filled 2023-12-21: qty 1000

## 2023-12-21 MED ORDER — LACTULOSE 10 GM/15ML PO SOLN
30.0000 g | Freq: Three times a day (TID) | ORAL | Status: DC
  Administered 2023-12-21 – 2023-12-22 (×2): 30 g via ORAL
  Filled 2023-12-21 (×2): qty 60

## 2023-12-21 MED ORDER — LACTULOSE 10 GM/15ML PO SOLN
10.0000 g | ORAL | Status: DC
Start: 2023-12-21 — End: 2023-12-21

## 2023-12-21 MED ORDER — SODIUM CHLORIDE 0.9 % IV BOLUS
1000.0000 mL | Freq: Once | INTRAVENOUS | Status: AC
  Administered 2023-12-21: 1000 mL via INTRAVENOUS

## 2023-12-21 NOTE — ED Provider Notes (Signed)
[place] [facility]ERGENCY DEPARTMENT AT [facility] Provider Note   CSN: [id number] Arrival date & time: 12/21/23  5284     History {Add pertinent medical, surgical, social history, OB history to HPI:1} Chief Complaint  Patient presents with   Nausea   Abdominal Pain    [name] is a 73 y.o. female.  Patient has a history of liver failure.  And cirrhosis.  She has been confused for couple days and having some vomiting   Abdominal Pain      Home Medications Prior to Admission medications   Medication Sig Start Date End Date Taking? Authorizing Provider  acidophilus (RISAQUAD) CAPS capsule Take 1 capsule by mouth daily.    [provider]  CRANBERRY PO Take 1 tablet by mouth 2 (two) times daily.    [provider]  estradiol (ESTRACE) 0.1 MG/GM vaginal cream Place 1 Applicatorful vaginally every other day. 11/09/22   [provider]  furosemide (LASIX) 20 MG tablet Take 20-40 mg by mouth daily.    [provider]  lactulose (CHRONULAC) 10 GM/15ML solution Take 45 mLs (30 g total) by mouth 3 (three) times daily. 03/13/23   Pokhrel, Rebekah Chesterfield, MD  levOCARNitine (CARNITOR) 330 MG tablet Take 330 mg by mouth 3 (three) times daily. 05/23/22   [provider]  liver oil-zinc oxide (DESITIN) 40 % ointment Apply topically daily as needed for irritation. 03/13/23   Pokhrel, Rebekah Chesterfield, MD  Multiple Vitamin (MULTI-VITAMIN) tablet Take 1 tablet by mouth daily.    [provider]  polyethylene glycol (MIRALAX / GLYCOLAX) 17 g packet Take 17 g by mouth daily as needed for mild constipation. 03/13/23   Pokhrel, Rebekah Chesterfield, MD  spironolactone (ALDACTONE) 25 MG tablet Take 50 mg by mouth daily.    [provider]  SYNTHROID 75 MCG tablet TAKE 1 TABLET BY MOUTH DAILY BEFORE BREAKFAST. MUST BE SEEN IN OFFICE FOR MORE REFILLS 09/17/23   Eden Emms, NP      Allergies    Contrast media [iodinated contrast media]    Review of  Systems   Review of Systems  Gastrointestinal:  Positive for abdominal pain.    Physical Exam Updated Vital Signs BP (!) 119/56   Pulse 64   Temp 97.6 F (36.4 C) (Oral)   Resp 14   SpO2 94%  Physical Exam  ED Results / Procedures / Treatments   Labs (all labs ordered are listed, but only abnormal results are displayed) Labs Reviewed  COMPREHENSIVE METABOLIC PANEL - Abnormal; Notable for the following components:      Result Value   Sodium 130 (*)    Chloride 95 (*)    Glucose, Bld 103 (*)    Albumin 3.0 (*)    AST 72 (*)    ALT 55 (*)    Alkaline Phosphatase 213 (*)    Total Bilirubin 3.1 (*)    All other components within normal limits  CBC - Abnormal; Notable for the following components:   WBC 13.6 (*)    RBC 3.84 (*)    RDW 15.9 (*)    Platelets 128 (*)    All other components within normal limits  AMMONIA - Abnormal; Notable for the following components:   Ammonia 87 (*)    All other components within normal limits  RESP PANEL BY RT-PCR (RSV, FLU A&B, COVID)  RVPGX2  LIPASE, BLOOD  URINALYSIS, ROUTINE W REFLEX MICROSCOPIC    EKG None  Radiology CT ABDOMEN PELVIS WO CONTRAST  Result Date: 12/21/2023 CLINICAL DATA:  abdominal pain, nausea, fevers EXAM: CT ABDOMEN AND PELVIS WITHOUT CONTRAST TECHNIQUE: Multidetector CT imaging of the abdomen and pelvis was performed following the standard protocol without IV contrast. RADIATION DOSE REDUCTION: This exam was performed according to the departmental dose-optimization program which includes automated exposure control, adjustment of the mA and/or kV according to patient size and/or use of iterative reconstruction technique. COMPARISON: 04/25/2023 and previous FINDINGS: Lower chest: No pleural or pericardial effusion Hepatobiliary: . TIPS stent stable position. No focal liver lesion or biliary ductal dilatation. Pancreas: Unremarkable. No pancreatic ductal dilatation or surrounding inflammatory changes. Spleen: Normal in  size without focal abnormality. Adrenals/Urinary Tract: Adrenal glands are unremarkable. Kidneys are normal, without renal calculi, focal lesion, or hydronephrosis. Bladder is unremarkable. Stomach/Bowel: Stomach nondistended, unremarkable. Small bowel decompressed. Post right hemicolectomy. Anastomotic staple line right mid abdomen. Remaining colon is incompletely distended, unremarkable. Vascular/Lymphatic: Mild scattered aortoiliac calcified plaque. No abdominal or pelvic adenopathy. Reproductive: Uterus and bilateral adnexa are unremarkable. Other: No abdominal ascites. No free air. Trace free fluid in the pelvis. Musculoskeletal: New compression fracture deformity of L1 with approximately 50% loss of height anteriorly, with diffuse sclerosis, no significant retropulsion. Stable compression fracture deformities of T10, T12, L2, and L3. IMPRESSION: 1. No acute findings. 2. New L1 compression fracture deformity. 3.  Aortic Atherosclerosis (ICD10-I70.0). Electronically Signed   By: Corlis Leak M.D.   On: 12/21/2023 15:48   CT Head Wo Contrast Result Date: 12/21/2023 CLINICAL DATA:  Headache, increased frequency or severity. Nausea and vomiting. EXAM: CT HEAD WITHOUT CONTRAST TECHNIQUE: Contiguous axial images were obtained from the base of the skull through the vertex without intravenous contrast. RADIATION DOSE REDUCTION: This exam was performed according to the departmental dose-optimization program which includes automated exposure control, adjustment of the mA and/or kV according to patient size and/or use of iterative reconstruction technique. COMPARISON:  02/09/2023 FINDINGS: Brain: The brain shows a normal appearance without evidence of malformation, atrophy, old or acute small or large vessel infarction, mass lesion, hemorrhage, hydrocephalus or extra-axial collection. Vascular: No significant finding. Skull: Normal.  No traumatic finding.  No focal bone lesion. Sinuses/Orbits: Sinuses are clear. Orbits  appear normal. Mastoids are clear. Other: None significant IMPRESSION: Normal head CT. Electronically Signed   By: Paulina Fusi M.D.   On: 12/21/2023 15:34   DG Chest 2 View Result Date: 12/21/2023 CLINICAL DATA:  Cough. EXAM: CHEST - 2 VIEW COMPARISON:  Chest radiograph dated 04/21/2023. FINDINGS: No focal consolidation or pneumothorax. Probable trace left pleural effusion. Stable cardiac silhouette. No acute osseous pathology. A TIPS noted over the liver. IMPRESSION: No focal consolidation.  Trace left pleural effusion. Electronically Signed   By: Elgie Collard M.D.   On: 12/21/2023 14:55    Procedures Procedures  {Document cardiac monitor, telemetry assessment procedure when appropriate:1}  Medications Ordered in ED Medications  lactulose (CHRONULAC) 10 GM/15ML solution 30 g (has no administration in time range)  ondansetron (ZOFRAN) injection 4 mg (has no administration in time range)  sodium chloride 0.9 % bolus 1,000 mL (1,000 mLs Intravenous New Bag/Given 12/21/23 1532)  ondansetron (ZOFRAN) injection 4 mg (4 mg Intravenous Given 12/21/23 1535)    ED Course/ Medical Decision Making/ A&P  CRITICAL CARE Performed by: Bethann Berkshire Total critical care time: 40 minutes Critical care time was exclusive of separately billable procedures and treating other patients. Critical care was necessary to treat or prevent imminent or life-threatening deterioration. Critical care was time spent personally by me on  the following activities: development of treatment plan with patient and/or surrogate as well as nursing, discussions with consultants, evaluation of patient's response to treatment, examination of patient, obtaining history from patient or surrogate, ordering and performing treatments and interventions, ordering and review of laboratory studies, ordering and review of radiographic studies, pulse oximetry and re-evaluation of patient's condition.  {   Click here for ABCD2, HEART and other  calculatorsREFRESH Note before signing :1}                              Medical Decision Making Amount and/or Complexity of Data Reviewed Labs: ordered. Radiology: ordered.  Risk Prescription drug management. Decision regarding hospitalization.  Patient with hepatic encephalopathy and cirrhosis.  Patient will be admitted to medicine with GI consult  {Document critical care time when appropriate:1} {Document review of labs and clinical decision tools ie heart score, Chads2Vasc2 etc:1}  {Document your independent review of radiology images, and any outside records:1} {Document your discussion with family members, caretakers, and with consultants:1} {Document social determinants of health affecting pt's care:1} {Document your decision making why or why not admission, treatments were needed:1} Final Clinical Impression(s) / ED Diagnoses Final diagnoses:  Hepatic encephalopathy (HCC)    Rx / DC Orders ED Discharge Orders     None

## 2023-12-21 NOTE — H&P (Signed)
History and Physical    Patient: [name]:[id number] DOB: 06-04-1951 DOA: 12/21/2023 DOS: the patient was seen and examined on 12/21/2023 PCP: Eden Emms, NP  Patient coming from: Home  Chief Complaint:  Chief Complaint  Patient presents with   Nausea   Abdominal Pain   HPI: [name] is a 73 y.o. female with medical history significant of acute urinary retention, osteoarthritis, cataracts, colon cancer, esophageal cancer, heart murmur, hypertension, hypothyroidism, iron deficiency anemia, liver disease, malignant neoplasm of the second, portal hypertension, splenomegaly, bacteremia who was brought to the emergency department due to nausea, abdominal pain and decreased mentation since yesterday.  Patient's spouse, she spent all day sleeping yesterday and has continued to be somnolent today.  Her oral intake has decreased.  She has been taking her levothyroxine.  History is taken from her husband as she is somnolent, only awake briefly to answer simple questions.  She has a frontal headache, has been having lower back pain, but no obvious history of trauma, other than several days ago sitting too hard on the toilet after misjudging the distance, but no fall.  No CP, dyspnea palpitations.  Lab work: CBC showed a white count of 13.6, hemoglobin 13.0 g/dL and platelets 811.  Lipase was 87 units/L.  Ammonia 87 mol/L.  Negative coronavirus, influenza and RSV PCR.  CMP showed a sodium 130 and chloride of 95 mmol/L, the rest of the electrolytes and renal function are normal.  Total protein 7.2 and albumin 3.0 g/dL.  Alkaline phosphatase 213, AST 72 and ALT 55 units/L.  Total bilirubin was 3.1 mg/dL, EE was 1.4 BJ/YN82 days ago.  Imaging: 2 view chest radiograph with no focal consolidation.  Trace left pleural effusion.  CT head without contrast was normal.  CT abdomen/pelvis without contrast with no acute findings.  There was a new L1 compression fracture deformity with about 50% loss  of height anteriorly, with diffuse sclerosis, no significant retropulsion.  Stable compression fractures deformities of the T10, T12, L2 and L3.  Aortic atherosclerosis.   ED course: Initial vital signs were temperature 97.7 F, pulse 70, saturation 15, BP 117/68 mmHg O2 sat 97% on room air.  The patient received lactulose 30 g IVP, ondansetron 4 mg IVP x 2 and 1000 mL normal saline bolus.  Review of Systems: As mentioned in the history of present illness. All other systems reviewed and are negative.  Past Medical History:  Diagnosis Date   Acute urinary retention 05/04/2022   Allergy 2006 ?   Contrast dye   Arthritis 2016 ??   Knees and thumb   Cancer [facility])    cecum   Cataract 2021   Surgery scheduled July 2023   Colon cancer [facility]) 2003   Elevated liver function tests    Esophageal varices (HCC)    Heart murmur On file   Hemorrhage of gastrointestinal tract 05/04/2011   Hypertension 2021   Hypothyroidism    Iron deficiency anemia    Liver disease    chemotherapy complication, per pt, shunts placed to bypass liver   Malignant neoplasm of cecum (HCC)    Portal hypertension (HCC)    Skin cancer 2019   Splenomegaly    Past Surgical History:  Procedure Laterality Date   COLON SURGERY  2004   Cancer   COSMETIC SURGERY  2021   Skin cancer   ESOPHAGEAL VARICE LIGATION     EYE SURGERY     HEMICOLECTOMY  01/08/2003   IR RADIOLOGIST EVAL &  MGMT  12/20/2020   IR RADIOLOGIST EVAL & MGMT  05/29/2021   LIVER SURGERY     shunts placed after chemo complication   SKIN FULL THICKNESS GRAFT N/A 09/12/2019   Procedure: debridement and FTSG to the nose from left upper arm;  Surgeon: Allena Napoleon, MD;  Location:  SURGERY CENTER;  Service: Plastics;  Laterality: N/A;  2 hours, please   TIPS PROCEDURE     Social History:  reports that she has never smoked. She has never used smokeless tobacco. She reports that she does not drink alcohol and does not use drugs.  Allergies   Allergen Reactions   Contrast Media [Iodinated Contrast Media] Hives    Family History  Problem Relation Age of Onset   Arthritis Mother    Hearing loss Mother    Heart disease Mother    Hypertension Mother    Miscarriages / [place] Mother    Arthritis Father    Diabetes Father    Heart disease Father    Cancer Maternal Aunt    Breast cancer Neg Hx    Colon cancer Neg Hx    Esophageal cancer Neg Hx    Pancreatic cancer Neg Hx    Stomach cancer Neg Hx     Prior to Admission medications   Medication Sig Start Date End Date Taking? Authorizing Provider  acidophilus (RISAQUAD) CAPS capsule Take 1 capsule by mouth daily.   Yes [provider]  CRANBERRY PO Take 1 tablet by mouth 2 (two) times daily.   Yes [provider]  estradiol (ESTRACE) 0.1 MG/GM vaginal cream Place 1 Applicatorful vaginally every other day. 11/09/22  Yes [provider]  furosemide (LASIX) 20 MG tablet Take 20-40 mg by mouth See admin instructions. Take blood pressure and depending on reading take 20mg  or 40mg  by mouth daily.   Yes [provider]  lactulose (CHRONULAC) 10 GM/15ML solution Take 45 mLs (30 g total) by mouth 3 (three) times daily. 03/13/23  Yes Pokhrel, Laxman, MD  levOCARNitine (CARNITOR) 330 MG tablet Take 330 mg by mouth 3 (three) times daily. 05/23/22  Yes [provider]  liver oil-zinc oxide (DESITIN) 40 % ointment Apply topically daily as needed for irritation. 03/13/23  Yes Pokhrel, Laxman, MD  Multiple Vitamin (MULTI-VITAMIN) tablet Take 1 tablet by mouth daily.   Yes [provider]  polyethylene glycol (MIRALAX / GLYCOLAX) 17 g packet Take 17 g by mouth daily as needed for mild constipation. 03/13/23  Yes Pokhrel, Laxman, MD  spironolactone (ALDACTONE) 25 MG tablet Take 50 mg by mouth daily.   Yes [provider]  SYNTHROID 75 MCG tablet TAKE 1 TABLET BY MOUTH DAILY BEFORE BREAKFAST. MUST BE SEEN IN OFFICE FOR MORE REFILLS  09/17/23  Yes Eden Emms, NP    Physical Exam: Vitals:   12/21/23 1019 12/21/23 1437  BP: 117/68 (!) 119/56  Pulse: 70 64  Resp: 15 14  Temp: 97.7 F (36.5 C) 97.6 F (36.4 C)  TempSrc: Oral Oral  SpO2: 97% 94%   Physical Exam Vitals and nursing note reviewed.  Constitutional:      General: She is sleeping. She is not in acute distress.    Appearance: She is well-developed. She is ill-appearing.  HENT:     Head: Normocephalic.     Nose: No rhinorrhea.  Eyes:     General: Scleral icterus present.     Pupils: Pupils are equal, round, and reactive to light.  Neck:  Vascular: No JVD.  Cardiovascular:     Rate and Rhythm: Normal rate and regular rhythm.     Heart sounds: S1 normal and S2 normal.  Pulmonary:     Effort: Pulmonary effort is normal.     Breath sounds: Normal breath sounds. No wheezing, rhonchi or rales.  Abdominal:     General: Bowel sounds are normal. There is no distension.     Palpations: Abdomen is soft.     Tenderness: There is no abdominal tenderness.  Musculoskeletal:     Cervical back: Neck supple.     Right lower leg: No edema.     Left lower leg: No edema.  Skin:    General: Skin is warm and dry.     Coloration: Skin is jaundiced.  Neurological:     General: No focal deficit present.     Mental Status: She is easily aroused.     Comments: Somnolent.  Psychiatric:        Mood and Affect: Mood normal.        Behavior: Behavior normal. Behavior is cooperative.     Data Reviewed:  Results are pending, will review when available.  Assessment and Plan: Principal Problem:   Decompensated liver cirrhosis with portal HTN and gastric varices Presenting with:   Altered mental status In the setting of:   Hepatic encephalopathy (HCC) Observation/telemetry. Frequent neurochecks. Consult PT and OT if no improvement weakness. Resume lactulose 30 g p.o. 3 times daily. Follow-up ammonia level in a.m. Continue levocarnitine 330 mg p.o. 3  times daily. Continue spironolactone 25 mg p.o. daily. Will check fluid status in a.m. and resume Lasix as needed. [facility] will be evaluating her tomorrow morning.  Active Problems:   Hypothyroidism Continue levothyroxine 75 mcg p.o. daily. Follow-up with PCP as an outpatient.    Essential hypertension On furosemide and spironolactone. Continue close BP monitoring. Follow-up renal function electrolytes.      Advance Care Planning:   Code Status: Full Code   Consults: [facility].  Family Communication: Her husband was at bedside.  Severity of Illness: The appropriate patient status for this patient is OBSERVATION. Observation status is judged to be reasonable and necessary in order to provide the required intensity of service to ensure the patient's safety. The patient's presenting symptoms, physical exam findings, and initial radiographic and laboratory data in the context of their medical condition is felt to place them at decreased risk for further clinical deterioration. Furthermore, it is anticipated that the patient will be medically stable for discharge from the hospital within 2 midnights of admission.   Author: Bobette Mo, MD 12/21/2023 5:33 PM  For on call review [email or web address].   This document was prepared using Dragon voice recognition software and may contain some unintended transcription errors.

## 2023-12-21 NOTE — ED Notes (Signed)
ED Provider at bedside.

## 2023-12-21 NOTE — Progress Notes (Signed)
Virtual Visit Consent   [name], you are scheduled for a virtual visit with a [facility] provider today. Just as with appointments in the office, your consent must be obtained to participate. Your consent will be active for this visit and any virtual visit you may have with one of our providers in the next 365 days. If you have a MyChart account, a copy of this consent can be sent to you electronically.  As this is a virtual visit, video technology does not allow for your provider to perform a traditional examination. This may limit your provider's ability to fully assess your condition. If your provider identifies any concerns that need to be evaluated in person or the need to arrange testing (such as labs, EKG, etc.), we will make arrangements to do so. Although advances in technology are sophisticated, we cannot ensure that it will always work on either your end or our end. If the connection with a video visit is poor, the visit may have to be switched to a telephone visit. With either a video or telephone visit, we are not always able to ensure that we have a secure connection.  By engaging in this virtual visit, you consent to the provision of healthcare and authorize for your insurance to be billed (if applicable) for the services provided during this visit. Depending on your insurance coverage, you may receive a charge related to this service.  I need to obtain your verbal consent now. Are you willing to proceed with your visit today? [name] has provided verbal consent on 12/21/2023 for a virtual visit (video or telephone). [name], [place]  Date: 12/21/2023 7:55 AM  Virtual Visit via Video Note   I, [name], connected with  Anwar Crill  ([id number], Oct 16, 1951) on 12/21/23 at  8:00 AM EST by a video-enabled telemedicine application and verified that I am speaking with the correct person using two identifiers.  Location: Patient: Virtual Visit Location  Patient: Home Provider: Virtual Visit Location Provider: Home Office   I discussed the limitations of evaluation and management by telemedicine and the availability of in person appointments. The patient expressed understanding and agreed to proceed.    History of Present Illness: [name] is a 73 y.o. who identifies as a female who was assigned female at birth, and is being seen today with her husband for progressively worsening lethargy, constipation, confusion and now with episode of emesis. PAtient with history of liver failure and hepatic encephalopathy. Husband noting increase in symptoms so reached out to PCP office yesterday and sent to triage. Triage RN scheduled them for virtual urgent care visit next day with ER precautions. Husband notes this morning patient is worse, having messed on herself and not knowing where she is presently at.  HPI: HPI  Problems:  Patient Active Problem List   Diagnosis Date Noted   Decreased GFR 12/10/2023   Preventative health care 09/09/2023   Lower extremity edema 08/10/2023   Cellulitis of both lower extremities 08/04/2023   Altered mental status 08/04/2023   Bradycardia 05/07/2023   Adrenal insufficiency (HCC) 03/19/2023   Protein-calorie malnutrition, severe (HCC) 02/26/2023   Weight loss 02/24/2023   Other constipation 02/24/2023   Generalized abdominal pain 02/24/2023   Hospital discharge follow-up 02/24/2023   Weakness 01/04/2023   Frequent UTI 05/18/2022   Anemia 05/18/2022   AKI (acute kidney injury) (HCC) 04/30/2022   Tachycardia-bradycardia syndrome (HCC) 02/05/2022   Hepatic encephalopathy (HCC) 02/03/2022  Basal cell carcinoma (BCC) 05/06/2021   Cecal cancer (HCC) 05/06/2021   Hypertension 05/06/2021   Decompensated liver cirrhosis with portal HTN and gastric varices 12/04/2020   Essential hypertension 08/12/2020   Murmur, cardiac 03/19/2020   Hypothyroidism 06/30/2019   History of basal cell cancer 06/30/2019   Hx of  colon cancer, stage III 11/30/2011   Esophageal varices (HCC) 06/12/2010   Portal hypertension (HCC) 01/23/2008    Allergies:  Allergies  Allergen Reactions   Contrast Media [Iodinated Contrast Media] Hives   Medications:  Current Outpatient Medications:    acidophilus (RISAQUAD) CAPS capsule, Take 1 capsule by mouth daily., Disp: , Rfl:    CRANBERRY PO, Take 1 tablet by mouth 2 (two) times daily., Disp: , Rfl:    estradiol (ESTRACE) 0.1 MG/GM vaginal cream, Place 1 Applicatorful vaginally every other day., Disp: , Rfl:    furosemide (LASIX) 20 MG tablet, Take 20-40 mg by mouth daily., Disp: , Rfl:    lactulose (CHRONULAC) 10 GM/15ML solution, Take 45 mLs (30 g total) by mouth 3 (three) times daily., Disp: 236 mL, Rfl: 0   levOCARNitine (CARNITOR) 330 MG tablet, Take 330 mg by mouth 3 (three) times daily., Disp: , Rfl:    liver oil-zinc oxide (DESITIN) 40 % ointment, Apply topically daily as needed for irritation., Disp: 56.7 g, Rfl: 0   Multiple Vitamin (MULTI-VITAMIN) tablet, Take 1 tablet by mouth daily., Disp: , Rfl:    polyethylene glycol (MIRALAX / GLYCOLAX) 17 g packet, Take 17 g by mouth daily as needed for mild constipation., Disp: 14 each, Rfl: 0   spironolactone (ALDACTONE) 25 MG tablet, Take 50 mg by mouth daily., Disp: , Rfl:    SYNTHROID 75 MCG tablet, TAKE 1 TABLET BY MOUTH DAILY BEFORE BREAKFAST. MUST BE SEEN IN OFFICE FOR MORE REFILLS, Disp: 90 tablet, Rfl: 1  Observations/Objective: Patient is very lethargic but does respond to questions.  She is alert to person but not place or time. Head is normocephalic, atraumatic.  No labored breathing.  Speech is clear and coherent with logical content.  Patient is alert and oriented at baseline.   Assessment and Plan: 1. Acute hepatic encephalopathy (HCC) (Primary)  ER evaluation. Discussed EMS transport. Family with her presently with plan for immediate ER assessment.  Feel she should have been triaged to the ER last night  based on triage note provided instead of being scheduled next day for a visit with virtual urgent care. However, family was given strict ER precautions per RN note. Will forward copy of note to PCP.  Follow Up Instructions: I discussed the assessment and treatment plan with the patient. The patient was provided an opportunity to ask questions and all were answered. The patient agreed with the plan and demonstrated an understanding of the instructions.  A copy of instructions were sent to the patient via MyChart unless otherwise noted below.   The patient was advised to call back or seek an in-person evaluation if the symptoms worsen or if the condition fails to improve as anticipated.    Katherine Climes, PA-C

## 2023-12-21 NOTE — ED Notes (Signed)
Patient transported to CT. Md Zamit states to hold of on labs

## 2023-12-21 NOTE — ED Triage Notes (Signed)
Pt here from home with family with c/o abd pain and nausea and vomiting , fevers at home none  here , pt ha known liver problems has not missed any doses of her lactulose

## 2023-12-21 NOTE — Telephone Encounter (Signed)
Contacted pt to get an update on how she is doing.   Pt's husband [name] stated that they are currently at [facility] being seen. Pt has had 2 big bowel movements but is not getting any better.  Complains of constant headaches with vomiting.  Complains of sleeping all day yesterday and for most of the day on Sunday.  [name] says its hard to get fluids in her because she is sleeping majority of the time.  [name] also states that her BP has been normal. Temp averages at 98.4, cliff states that is high for the pt.    Pt had virtual appointment for yesterday but missed due to pt throwing up.  Was advised to go to the hospital.  Will call back after the visit to schedule Hospital follow up appt.

## 2023-12-21 NOTE — Telephone Encounter (Signed)
Noted

## 2023-12-22 ENCOUNTER — Observation Stay (HOSPITAL_COMMUNITY): Payer: Medicare Other

## 2023-12-22 ENCOUNTER — Inpatient Hospital Stay (HOSPITAL_COMMUNITY): Payer: Medicare Other

## 2023-12-22 ENCOUNTER — Ambulatory Visit: Payer: Medicare Other | Admitting: Nurse Practitioner

## 2023-12-22 DIAGNOSIS — K7460 Unspecified cirrhosis of liver: Secondary | ICD-10-CM

## 2023-12-22 DIAGNOSIS — M7989 Other specified soft tissue disorders: Secondary | ICD-10-CM | POA: Diagnosis not present

## 2023-12-22 DIAGNOSIS — R680 Hypothermia, not associated with low environmental temperature: Secondary | ICD-10-CM | POA: Diagnosis not present

## 2023-12-22 DIAGNOSIS — Z8249 Family history of ischemic heart disease and other diseases of the circulatory system: Secondary | ICD-10-CM | POA: Diagnosis not present

## 2023-12-22 DIAGNOSIS — I6389 Other cerebral infarction: Secondary | ICD-10-CM | POA: Diagnosis not present

## 2023-12-22 DIAGNOSIS — Z79899 Other long term (current) drug therapy: Secondary | ICD-10-CM | POA: Diagnosis not present

## 2023-12-22 DIAGNOSIS — K7682 Hepatic encephalopathy: Secondary | ICD-10-CM | POA: Diagnosis not present

## 2023-12-22 DIAGNOSIS — Z85828 Personal history of other malignant neoplasm of skin: Secondary | ICD-10-CM | POA: Diagnosis not present

## 2023-12-22 DIAGNOSIS — Z79890 Hormone replacement therapy (postmenopausal): Secondary | ICD-10-CM | POA: Diagnosis not present

## 2023-12-22 DIAGNOSIS — N179 Acute kidney failure, unspecified: Secondary | ICD-10-CM | POA: Diagnosis not present

## 2023-12-22 DIAGNOSIS — M4856XA Collapsed vertebra, not elsewhere classified, lumbar region, initial encounter for fracture: Secondary | ICD-10-CM | POA: Diagnosis present

## 2023-12-22 DIAGNOSIS — D696 Thrombocytopenia, unspecified: Secondary | ICD-10-CM

## 2023-12-22 DIAGNOSIS — I10 Essential (primary) hypertension: Secondary | ICD-10-CM | POA: Diagnosis present

## 2023-12-22 DIAGNOSIS — I6340 Cerebral infarction due to embolism of unspecified cerebral artery: Secondary | ICD-10-CM | POA: Diagnosis present

## 2023-12-22 DIAGNOSIS — K7290 Hepatic failure, unspecified without coma: Secondary | ICD-10-CM | POA: Diagnosis not present

## 2023-12-22 DIAGNOSIS — Z91041 Radiographic dye allergy status: Secondary | ICD-10-CM | POA: Diagnosis not present

## 2023-12-22 DIAGNOSIS — K8020 Calculus of gallbladder without cholecystitis without obstruction: Secondary | ICD-10-CM | POA: Diagnosis not present

## 2023-12-22 DIAGNOSIS — K766 Portal hypertension: Secondary | ICD-10-CM | POA: Diagnosis present

## 2023-12-22 DIAGNOSIS — Z452 Encounter for adjustment and management of vascular access device: Secondary | ICD-10-CM | POA: Diagnosis not present

## 2023-12-22 DIAGNOSIS — I6782 Cerebral ischemia: Secondary | ICD-10-CM | POA: Diagnosis not present

## 2023-12-22 DIAGNOSIS — Z85038 Personal history of other malignant neoplasm of large intestine: Secondary | ICD-10-CM | POA: Diagnosis not present

## 2023-12-22 DIAGNOSIS — E039 Hypothyroidism, unspecified: Secondary | ICD-10-CM | POA: Diagnosis present

## 2023-12-22 DIAGNOSIS — I63522 Cerebral infarction due to unspecified occlusion or stenosis of left anterior cerebral artery: Secondary | ICD-10-CM | POA: Diagnosis not present

## 2023-12-22 DIAGNOSIS — R531 Weakness: Secondary | ICD-10-CM | POA: Diagnosis not present

## 2023-12-22 DIAGNOSIS — E861 Hypovolemia: Secondary | ICD-10-CM | POA: Diagnosis present

## 2023-12-22 DIAGNOSIS — E871 Hypo-osmolality and hyponatremia: Secondary | ICD-10-CM | POA: Diagnosis present

## 2023-12-22 DIAGNOSIS — Z1152 Encounter for screening for COVID-19: Secondary | ICD-10-CM | POA: Diagnosis not present

## 2023-12-22 DIAGNOSIS — I959 Hypotension, unspecified: Secondary | ICD-10-CM | POA: Diagnosis present

## 2023-12-22 DIAGNOSIS — D539 Nutritional anemia, unspecified: Secondary | ICD-10-CM | POA: Diagnosis not present

## 2023-12-22 DIAGNOSIS — G9341 Metabolic encephalopathy: Secondary | ICD-10-CM | POA: Diagnosis present

## 2023-12-22 DIAGNOSIS — Z9221 Personal history of antineoplastic chemotherapy: Secondary | ICD-10-CM | POA: Diagnosis not present

## 2023-12-22 DIAGNOSIS — I639 Cerebral infarction, unspecified: Secondary | ICD-10-CM | POA: Diagnosis not present

## 2023-12-22 DIAGNOSIS — D6959 Other secondary thrombocytopenia: Secondary | ICD-10-CM | POA: Diagnosis present

## 2023-12-22 DIAGNOSIS — E669 Obesity, unspecified: Secondary | ICD-10-CM | POA: Diagnosis present

## 2023-12-22 LAB — CBC
HCT: 33 % — ABNORMAL LOW (ref 36.0–46.0)
Hemoglobin: 11.2 g/dL — ABNORMAL LOW (ref 12.0–15.0)
MCH: 34 pg (ref 26.0–34.0)
MCHC: 33.9 g/dL (ref 30.0–36.0)
MCV: 100.3 fL — ABNORMAL HIGH (ref 80.0–100.0)
Platelets: 104 10*3/uL — ABNORMAL LOW (ref 150–400)
RBC: 3.29 MIL/uL — ABNORMAL LOW (ref 3.87–5.11)
RDW: 16.1 % — ABNORMAL HIGH (ref 11.5–15.5)
WBC: 6.4 10*3/uL (ref 4.0–10.5)
nRBC: 0 % (ref 0.0–0.2)

## 2023-12-22 LAB — COMPREHENSIVE METABOLIC PANEL
ALT: 43 U/L (ref 0–44)
AST: 54 U/L — ABNORMAL HIGH (ref 15–41)
Albumin: 2.4 g/dL — ABNORMAL LOW (ref 3.5–5.0)
Alkaline Phosphatase: 159 U/L — ABNORMAL HIGH (ref 38–126)
Anion gap: 11 (ref 5–15)
BUN: 21 mg/dL (ref 8–23)
CO2: 23 mmol/L (ref 22–32)
Calcium: 8.6 mg/dL — ABNORMAL LOW (ref 8.9–10.3)
Chloride: 102 mmol/L (ref 98–111)
Creatinine, Ser: 0.65 mg/dL (ref 0.44–1.00)
GFR, Estimated: >60 mL/min (ref >60)
Glucose, Bld: 97 mg/dL (ref 70–99)
Potassium: 3.8 mmol/L (ref 3.5–5.1)
Sodium: 136 mmol/L (ref 135–145)
Total Bilirubin: 1.8 mg/dL — ABNORMAL HIGH (ref 0.0–1.2)
Total Protein: 5.9 g/dL — ABNORMAL LOW (ref 6.5–8.1)

## 2023-12-22 LAB — URINALYSIS, ROUTINE W REFLEX MICROSCOPIC
Bacteria, UA: NONE SEEN
Bilirubin Urine: NEGATIVE
Glucose, UA: NEGATIVE mg/dL
Hgb urine dipstick: NEGATIVE
Ketones, ur: NEGATIVE mg/dL
Nitrite: NEGATIVE
Protein, ur: NEGATIVE mg/dL
Specific Gravity, Urine: 1.017 (ref 1.005–1.030)
pH: 7 (ref 5.0–8.0)

## 2023-12-22 LAB — AMMONIA: Ammonia: 63 umol/L — ABNORMAL HIGH (ref 9–35)

## 2023-12-22 MED ORDER — KCL IN DEXTROSE-NACL 20-5-0.9 MEQ/L-%-% IV SOLN
INTRAVENOUS | Status: AC
  Filled 2023-12-22: qty 1000

## 2023-12-22 MED ORDER — RIFAXIMIN 550 MG PO TABS
550.0000 mg | ORAL_TABLET | Freq: Two times a day (BID) | ORAL | Status: DC
  Administered 2023-12-22 – 2023-12-25 (×7): 550 mg via ORAL
  Filled 2023-12-22 (×7): qty 1

## 2023-12-22 MED ORDER — LACTULOSE 10 GM/15ML PO SOLN
60.0000 g | Freq: Three times a day (TID) | ORAL | Status: DC
  Administered 2023-12-22 – 2023-12-23 (×5): 60 g via ORAL
  Filled 2023-12-22 (×5): qty 90

## 2023-12-22 MED ORDER — PROCHLORPERAZINE EDISYLATE 10 MG/2ML IJ SOLN
10.0000 mg | Freq: Four times a day (QID) | INTRAMUSCULAR | Status: DC | PRN
  Administered 2023-12-22: 10 mg via INTRAVENOUS
  Filled 2023-12-22: qty 2

## 2023-12-22 MED ORDER — KETOROLAC TROMETHAMINE 15 MG/ML IJ SOLN
7.5000 mg | Freq: Once | INTRAMUSCULAR | Status: AC
  Administered 2023-12-22: 7.5 mg via INTRAVENOUS
  Filled 2023-12-22: qty 1

## 2023-12-22 NOTE — Telephone Encounter (Signed)
Noted. I see patient was admitted to the hospital

## 2023-12-22 NOTE — Progress Notes (Signed)
[facility] PROGRESS NOTE  [name] (DOB: [birth year]) ZHY:[id number] PCP: Eden Emms, NP  Brief Narrative: [name] is a 73 y.o. female with a history of stage IIIb cecal adenocarcinoma s/p hemicolectomy and FOLFOX in 2004, hepatic cirrhosis with portal HTN, EVs s/p ligation, thrombocytopenia, s/p TIPS 2012, hx hepatic encephalopathy who was brought from home by her husband to the ED on 12/21/2023 with worsening encephalopathy  Subjective: Headache, vomiting, no other complaints, not very interactive  Objective: BP 113/64   Pulse 73   Temp 98 F (36.7 C) (Oral)   Resp 16   SpO2 97%   Gen: Unwell appearing female in no acute distress Eyes: PERRL, hazy pink conjunctivae Pulm: Clear, nonlabored  CV: RRR, no MRG. Trace nonpitting edema in LE's GI: Soft, NT, ND, +BS Neuro: Rousable and oriented, slow to respond with diffuse mild tremor with intention but no asterixis. No new focal deficits. Ext: Warm, no deformities. Skin: No open wounds on visualized skin   Assessment & Plan: Principal Problem:   Hepatic encephalopathy (HCC) Active Problems:   Decompensated liver cirrhosis with portal HTN and gastric varices   Hypothyroidism   Essential hypertension   Altered mental status   Thrombocytopenia (HCC)  Acute metabolic encephalopathy: CT head nonacute, though pt has reported headache which is not typical for her episodes. Ammonia 87 > 63.  - Will treat primarily as hepatic encephalopathy with lactulose (enemas if needed) - Add rifaximin while admitted, though is cost prohibitive as outpatient maintenance therapy. Pt's spouse questions utility of prn rifaximin at home to avoid recurrent hospitalization. Defer to GI.  - Check MRI brain - Check TSH as below.  - Further work up to be based on response.   Leukocytosis: In setting of otherwise hemoconcentrated labs, afebrile. No meningismus. UA negative, CXR negative, CT without nidus of infection. Has resolved  without antimicrobial Tx. Will monitor without abx at this time.  Oxaliplatin-induced hepatic cirrhosis: Chemo 2004, progressive cirrhosis w/TIPS 2012, followed by [facility], not transplant candidate apparently due to advanced age and adenocarcinoma.  - Check coags, daily CMP, recalculate MELD score - Abd U/S to r/o ascites, would get diagnostic para if able.  - Addendum > TIPS is patent, though stenosis suspected based on velocities, will request IR recommendations for this.  - Hold diuretics today.  Thrombocytopenia, macrocytic anemia: Stable, no bleeding currently - Monitor  Hypothyroidism: Last TSH in Oct 2024 was 0.20 (LLN 0.35) - Recheck TSH with free T4, free T3 - Continue synthroid - Endocrinology f/u.   History of adrenal insufficiency: Last cortisol was wnl, having weaned off hydrocortisone as outpatient.  - Recheck AM cortisol, f/u with Dr. Roosevelt Locks regardless for ACTH stim test.   Hypotension: Has been on midodrine in the past. BP currently normotensive.  Hyponatremia: Hypovolemic, improved - Continue IVF  L1 compression fracture: Newly seen on CT this admit. No focal pain/tenderness at this time. Also with stable deofrmities at T10, T12, L2, L3.   Tyrone Nine, MD [facility] [email or web address] 12/22/2023, 5:51 PM

## 2023-12-22 NOTE — ED Notes (Signed)
Pt vomited again after PRN zofran

## 2023-12-22 NOTE — ED Notes (Addendum)
Pt vomited x 1, PRN given

## 2023-12-22 NOTE — Consult Note (Addendum)
Consultation  Referring Provider:   Dr. Jarvis Newcomer, [facility] Primary Care Physician:  Eden Emms, NP Primary Gastroenterologist:  Dr. Marina Goodell       Reason for Consultation:     Hepatic encephalopathy DOA: 12/21/2023         Hospital Day: 2         HPI:   [name] is a 73 y.o. female with past medical history significant for urinary retention, osteoarthritis, hypothyroidism, hypertension, history of colon cancer 2004 stage IIIb cecal adenocarcinoma status post surgery and Fluvax, esophageal cancer, IDA, cirrhosis with portal hypertension, splenomegaly, thrombocytopenia, history of bacteremia.   Patient previously known to Dr. Orvan Falconer currently Dr. Marina Goodell. Patient has history of cirrhosis with portal hypertension, thrombocytopenia.   Patient had variceal bleed in 2012 status post TIPS with resultant hepatic encephalopathy on lactulose, zinc.  Xifaxan was cost prohibitive.   [facility]. Previous admission 02/26/2023 for hepatic encephalopathy ammonia was 163 UA was positive for infection, improved with Xifaxan and lactulose enemas.  Presents to the ER with worsening mentation with decreased oral intake.  Work up notable for: Leukocytosis WBC to 13.8, thrombocytopenia platelets 128, Hgb 13 Status post fluids WBC 6.4, Hgb 11.2, MCV 100.3, platelets 104 Albumin 3, AST 72, ALT 55, alk phos 213, total bilirubin 3.1 Status post fluids albumin 2.4, AST 54, ALT 43, alk phos 139, total bili 1.8, calcium 8.6 BUN 22, creatinine 0.99 Sodium 130 Ammonia 87, lipase -37 Pending urinalysis  Patient with family at bedside, husband, [name]. Provided the majority of the history.  Husband states previously she was on lactulose 3 tablespoons 3 times daily she began to act more lethargic, having smaller bowel movements.  Was more lethargic so had office visit on the 11th she had a negative urine at that time. He states he increased the lactulose to 4 tablespoons 4 times daily  she began to have a little bit fuller bowel movements and he decreased it back down to 4 tablespoons 3 times daily on Friday.  Last bowel movement was yesterday morning which was large, loose, denies melena or hematochezia. She started complaining of headache on this past Sunday per husband she has had a temperature but this has been 98.2 which husband states is fever for her. Patient's had some dark urine on Tuesday denies any dysuria.  Denies any jaundice   Has not had any abdominal swelling but has had some lower leg swelling. Patient had some mild nausea and 1 episode of vomiting that prompted the patient to come in. Has been states she has not been able to take her thyroid medication has not been adjusted in many years. She also has had recent adrenal insufficiency with low cortisol around 3 stim test 0.2 patient was on hydrocortisone but this was stopped 09/2023, patient had normal cortisol levels 10/26/2023, Last saw Dr. Roosevelt Locks at that time, follow-up 12/30/2023.  Abnormal ED labs:  Abnormal Labs Reviewed  COMPREHENSIVE METABOLIC PANEL - Abnormal; Notable for the following components:      Result Value   Sodium 130 (*)    Chloride 95 (*)    Glucose, Bld 103 (*)    Albumin 3.0 (*)    AST 72 (*)    ALT 55 (*)    Alkaline Phosphatase 213 (*)    Total Bilirubin 3.1 (*)    All other components within normal limits  CBC - Abnormal; Notable for the following components:   WBC 13.6 (*)  RBC 3.84 (*)    RDW 15.9 (*)    Platelets 128 (*)    All other components within normal limits  AMMONIA - Abnormal; Notable for the following components:   Ammonia 87 (*)    All other components within normal limits  CBC - Abnormal; Notable for the following components:   RBC 3.29 (*)    Hemoglobin 11.2 (*)    HCT 33.0 (*)    MCV 100.3 (*)    RDW 16.1 (*)    Platelets 104 (*)    All other components within normal limits  COMPREHENSIVE METABOLIC PANEL - Abnormal; Notable for the following  components:   Calcium 8.6 (*)    Total Protein 5.9 (*)    Albumin 2.4 (*)    AST 54 (*)    Alkaline Phosphatase 159 (*)    Total Bilirubin 1.8 (*)    All other components within normal limits  AMMONIA - Abnormal; Notable for the following components:   Ammonia 63 (*)    All other components within normal limits    Past Medical History:  Diagnosis Date   Acute urinary retention 05/04/2022   Allergy 2006 ?   Contrast dye   Arthritis 2016 ??   Knees and thumb   Cancer [facility])    cecum   Cataract 2021   Surgery scheduled July 2023   Colon cancer [facility]) 2003   Elevated liver function tests    Esophageal varices (HCC)    Heart murmur On file   Hemorrhage of gastrointestinal tract 05/04/2011   Hypertension 2021   Hypothyroidism    Iron deficiency anemia    Liver disease    chemotherapy complication, per pt, shunts placed to bypass liver   Malignant neoplasm of cecum (HCC)    Portal hypertension (HCC)    Skin cancer 2019   Splenomegaly     Surgical History:  She  has a past surgical history that includes Hemicolectomy (01/08/2003); Liver surgery; Esophageal varice ligation; Full thickness skin graft (N/A, 09/12/2019); TIPS procedure; IR Radiologist Eval & Mgmt (12/20/2020); IR Radiologist Eval & Mgmt (05/29/2021); Eye surgery; Colon surgery (2004); and Cosmetic surgery (2021). Family History:  Her family history includes Arthritis in her father and mother; Cancer in her maternal aunt; Diabetes in her father; Hearing loss in her mother; Heart disease in her father and mother; Hypertension in her mother; Miscarriages / [place] in her mother. Social History:   reports that she has never smoked. She has never used smokeless tobacco. She reports that she does not drink alcohol and does not use drugs.  Prior to Admission medications   Medication Sig Start Date End Date Taking? Authorizing Provider  acidophilus (RISAQUAD) CAPS capsule Take 1 capsule by mouth daily.   Yes [provider]  CRANBERRY PO Take 1 tablet by mouth 2 (two) times daily.   Yes [provider]  estradiol (ESTRACE) 0.1 MG/GM vaginal cream Place 1 Applicatorful vaginally every other day. 11/09/22  Yes [provider]  furosemide (LASIX) 20 MG tablet Take 20-40 mg by mouth See admin instructions. Take blood pressure and depending on reading take 20mg  or 40mg  by mouth daily.   Yes [provider]  lactulose (CHRONULAC) 10 GM/15ML solution Take 45 mLs (30 g total) by mouth 3 (three) times daily. 03/13/23  Yes Pokhrel, Laxman, MD  levOCARNitine (CARNITOR) 330 MG tablet Take 330 mg by mouth 3 (three) times daily. 05/23/22  Yes [provider]  liver oil-zinc oxide (DESITIN) 40 %  ointment Apply topically daily as needed for irritation. 03/13/23  Yes Pokhrel, Laxman, MD  Multiple Vitamin (MULTI-VITAMIN) tablet Take 1 tablet by mouth daily.   Yes [provider]  polyethylene glycol (MIRALAX / GLYCOLAX) 17 g packet Take 17 g by mouth daily as needed for mild constipation. 03/13/23  Yes Pokhrel, Laxman, MD  spironolactone (ALDACTONE) 25 MG tablet Take 50 mg by mouth daily.   Yes [provider]  SYNTHROID 75 MCG tablet TAKE 1 TABLET BY MOUTH DAILY BEFORE BREAKFAST. MUST BE SEEN IN OFFICE FOR MORE REFILLS 09/17/23  Yes Eden Emms, NP    Current Facility-Administered Medications  Medication Dose Route Frequency Provider Last Rate Last Admin   acetaminophen (TYLENOL) tablet 325 mg  325 mg Oral Q6H PRN Bobette Mo, MD       Or   acetaminophen (TYLENOL) suppository 325 mg  325 mg Rectal Q6H PRN Bobette Mo, MD       dextrose 5 % and 0.9 % NaCl with KCl 20 mEq/L infusion   Intravenous Continuous Bobette Mo, MD 63 mL/hr at 12/21/23 1848 New Bag at 12/21/23 1848   lactulose (CHRONULAC) 10 GM/15ML solution 30 g  30 g Oral TID Bobette Mo, MD   30 g at 12/21/23 2225   levOCARNitine (CARNITOR) 1 GM/10ML solution 330 mg  330 mg  Oral TID Bobette Mo, MD   330 mg at 12/21/23 2232   levOCARNitine (CARNITOR) tablet 330 mg  330 mg Oral TID Bobette Mo, MD       levothyroxine (SYNTHROID) tablet 75 mcg  75 mcg Oral Daily Bobette Mo, MD       ondansetron [facility]) tablet 4 mg  4 mg Oral Q6H PRN Bobette Mo, MD       Or   ondansetron [facility]) injection 4 mg  4 mg Intravenous Q6H PRN Bobette Mo, MD   4 mg at 12/22/23 0622   polyethylene glycol (MIRALAX / GLYCOLAX) packet 17 g  17 g Oral Daily PRN Bobette Mo, MD       prochlorperazine (COMPAZINE) injection 10 mg  10 mg Intravenous Q6H PRN Tyrone Nine, MD   10 mg at 12/22/23 1610   Current Outpatient Medications  Medication Sig Dispense Refill   acidophilus (RISAQUAD) CAPS capsule Take 1 capsule by mouth daily.     CRANBERRY PO Take 1 tablet by mouth 2 (two) times daily.     estradiol (ESTRACE) 0.1 MG/GM vaginal cream Place 1 Applicatorful vaginally every other day.     furosemide (LASIX) 20 MG tablet Take 20-40 mg by mouth See admin instructions. Take blood pressure and depending on reading take 20mg  or 40mg  by mouth daily.     lactulose (CHRONULAC) 10 GM/15ML solution Take 45 mLs (30 g total) by mouth 3 (three) times daily. 236 mL 0   levOCARNitine (CARNITOR) 330 MG tablet Take 330 mg by mouth 3 (three) times daily.     liver oil-zinc oxide (DESITIN) 40 % ointment Apply topically daily as needed for irritation. 56.7 g 0   Multiple Vitamin (MULTI-VITAMIN) tablet Take 1 tablet by mouth daily.     polyethylene glycol (MIRALAX / GLYCOLAX) 17 g packet Take 17 g by mouth daily as needed for mild constipation. 14 each 0   spironolactone (ALDACTONE) 25 MG tablet Take 50 mg by mouth daily.     SYNTHROID 75 MCG tablet TAKE 1 TABLET BY MOUTH DAILY BEFORE BREAKFAST. MUST BE SEEN IN  OFFICE FOR MORE REFILLS 90 tablet 1    Allergies as of 12/21/2023 - Review Complete 12/21/2023  Allergen Reaction Noted   Contrast media [iodinated  contrast media] Hives 11/30/2011    Review of Systems:    Constitutional: No weight loss, fever, chills, weakness or fatigue HEENT: Eyes: No change in vision               Ears, Nose, Throat:  No change in hearing or congestion Skin: No rash or itching Cardiovascular: No chest pain, chest pressure or palpitations   Respiratory: No SOB or cough Gastrointestinal: See HPI and otherwise negative Genitourinary: No dysuria or change in urinary frequency Neurological: No headache, dizziness or syncope Musculoskeletal: No new muscle or joint pain Hematologic: No bleeding or bruising Psychiatric: No history of depression or anxiety     Physical Exam:  Vital signs in last 24 hours: Temp:  [97.5 F (36.4 C)-98.2 F (36.8 C)] 98.2 F (36.8 C) (01/29 0730) Pulse Rate:  [58-80] 80 (01/29 0730) Resp:  [10-25] 13 (01/29 0730) BP: (100-158)/(56-74) 125/64 (01/29 0730) SpO2:  [90 %-97 %] 92 % (01/29 0730)   Last BM recorded by nurses in past 5 days No data recorded  General : Well-developed, somnolent, ill-appearing female in no acute distress Head:  Normocephalic and atraumatic. Eyes :  scleral icterus possible,conjunctive pink  Heart:  regular rate and rhythm, no murmurs or gallops Pulm:  Clear anteriorly; no wheezing Abdomen:   Soft, Obese AB, skin exam normal, Normal bowel sounds. mild tenderness in the lower abdomen, some fullness. Without guarding and Without rebound, without hepatomegaly. no  fluid wave, no  shifting dullness.  Extremities:   With mild non pitting edema. Msk:  Symmetrical without gross deformities. Peripheral pulses intact.  Neurologic: Alert and  oriented x3; somnolent but responds to verbal stimuli, with mild tremor but no gross asterixis.  Skin:   without jaundice. no palmar erythema or spider angioma.   Psychiatric:  Demonstrates good judgement and reason without abnormal affect or behaviors.   LAB RESULTS: Recent Labs    12/21/23 0855 12/22/23 0500  WBC  13.6* 6.4  HGB 13.0 11.2*  HCT 38.4 33.0*  PLT 128* 104*   BMET Recent Labs    12/21/23 0855 12/22/23 0500  NA 130* 136  K 4.1 3.8  CL 95* 102  CO2 23 23  GLUCOSE 103* 97  BUN 22 21  CREATININE 0.99 0.65  CALCIUM 9.0 8.6*   LFT Recent Labs    12/22/23 0500  PROT 5.9*  ALBUMIN 2.4*  AST 54*  ALT 43  ALKPHOS 159*  BILITOT 1.8*   PT/INR No results for input(s): "LABPROT", "INR" in the last 72 hours.  STUDIES: CT ABDOMEN PELVIS WO CONTRAST Result Date: 12/21/2023 CLINICAL DATA:  abdominal pain, nausea, fevers EXAM: CT ABDOMEN AND PELVIS WITHOUT CONTRAST TECHNIQUE: Multidetector CT imaging of the abdomen and pelvis was performed following the standard protocol without IV contrast. RADIATION DOSE REDUCTION: This exam was performed according to the departmental dose-optimization program which includes automated exposure control, adjustment of the mA and/or kV according to patient size and/or use of iterative reconstruction technique. COMPARISON: 04/25/2023 and previous FINDINGS: Lower chest: No pleural or pericardial effusion Hepatobiliary: . TIPS stent stable position. No focal liver lesion or biliary ductal dilatation. Pancreas: Unremarkable. No pancreatic ductal dilatation or surrounding inflammatory changes. Spleen: Normal in size without focal abnormality. Adrenals/Urinary Tract: Adrenal glands are unremarkable. Kidneys are normal, without renal calculi, focal lesion, or hydronephrosis. Bladder is  unremarkable. Stomach/Bowel: Stomach nondistended, unremarkable. Small bowel decompressed. Post right hemicolectomy. Anastomotic staple line right mid abdomen. Remaining colon is incompletely distended, unremarkable. Vascular/Lymphatic: Mild scattered aortoiliac calcified plaque. No abdominal or pelvic adenopathy. Reproductive: Uterus and bilateral adnexa are unremarkable. Other: No abdominal ascites. No free air. Trace free fluid in the pelvis. Musculoskeletal: New compression fracture  deformity of L1 with approximately 50% loss of height anteriorly, with diffuse sclerosis, no significant retropulsion. Stable compression fracture deformities of T10, T12, L2, and L3. IMPRESSION: 1. No acute findings. 2. New L1 compression fracture deformity. 3.  Aortic Atherosclerosis (ICD10-I70.0). Electronically Signed   By: Corlis Leak M.D.   On: 12/21/2023 15:48   CT Head Wo Contrast Result Date: 12/21/2023 CLINICAL DATA:  Headache, increased frequency or severity. Nausea and vomiting. EXAM: CT HEAD WITHOUT CONTRAST TECHNIQUE: Contiguous axial images were obtained from the base of the skull through the vertex without intravenous contrast. RADIATION DOSE REDUCTION: This exam was performed according to the departmental dose-optimization program which includes automated exposure control, adjustment of the mA and/or kV according to patient size and/or use of iterative reconstruction technique. COMPARISON:  02/09/2023 FINDINGS: Brain: The brain shows a normal appearance without evidence of malformation, atrophy, old or acute small or large vessel infarction, mass lesion, hemorrhage, hydrocephalus or extra-axial collection. Vascular: No significant finding. Skull: Normal.  No traumatic finding.  No focal bone lesion. Sinuses/Orbits: Sinuses are clear. Orbits appear normal. Mastoids are clear. Other: None significant IMPRESSION: Normal head CT. Electronically Signed   By: Paulina Fusi M.D.   On: 12/21/2023 15:34   DG Chest 2 View Result Date: 12/21/2023 CLINICAL DATA:  Cough. EXAM: CHEST - 2 VIEW COMPARISON:  Chest radiograph dated 04/21/2023. FINDINGS: No focal consolidation or pneumothorax. Probable trace left pleural effusion. Stable cardiac silhouette. No acute osseous pathology. A TIPS noted over the liver. IMPRESSION: No focal consolidation.  Trace left pleural effusion. Electronically Signed   By: Elgie Collard M.D.   On: 12/21/2023 14:55      Impression/Plan:   Decompensated cirrhosis with  HE AST 54 ALT 43 Alkphos 159 TBili 1.8 INR 04/21/2023 1.7  MELD 3.0: 21 at 04/23/2023  3:21 AM MELD-Na: 19 at 04/23/2023  3:21 AM Calculated from: Serum Creatinine: 1.14 mg/dL at 1/61/0960  4:54 AM Serum Sodium: 134 mmol/L at 04/23/2023  3:21 AM Total Bilirubin: 3 mg/dL at 0/98/1191  4:78 AM Serum Albumin: 3 g/dL at 2/95/6213  0:86 AM INR(ratio): 1.7 at 04/21/2023  5:50 PM Age at listing (hypothetical): 53 years Sex: Female at 04/23/2023  3:21 AM  - Doppler r/o portal vein thrombosis and check on TIPS - Serial INR, CBC, CMET daily. - Daily MELD -X-ray without evidence of infection, pending urinalysis and patient's had multiple UTIs in the past -Monitor thyroid, consider cortisol level    Hepatic Encephalopathy:  12/22/2023 Ammonia 63 Unknown trigger rule out UTI/adrenal insufficiency/thyroid/infection Continue lactulose and increase to 60 mg 3 times daily, titrate to 3 bowel movements daily Add on Xifaxan 550 mg twice daily If patient's airway is compromised switch to lactulose enema 200grams twice 8 hours apart if NPO - minimize/remove all benzos and narcotics -CT head negative, pending MRI brain -Check thyroid, consider cortisol  Ascites No evidence of ascites on exam.  No ascites seen on CT AB and pelvis Some mild abdominal discomfort possible bladder versus stool, consider KUB/bladder scan  Thrombocytopenia secondary to above Platelets 104  Hypotension Consider rechecking cortisol, thyroid Consider midodrine  AKI Monitor kidney function   Principal Problem:  Hepatic encephalopathy (HCC) Active Problems:   Hypothyroidism   Essential hypertension   Decompensated liver cirrhosis with portal HTN and gastric varices   Altered mental status   Thrombocytopenia (HCC)    LOS: 0 days   Thank you for your kind consultation, we will continue to follow.   Doree Albee  12/22/2023, 7:59 AM  GI ATTENDING  History, laboratories, x-rays all personally reviewed.   Patient personally seen and examined.  Husband at bedside.  Patient known to me from the office after I recently assumed care.  Presents now with progressive altered mental status, presumably an exacerbation of hepatic encephalopathy.  More alert today.  Now on lactulose and Xifaxan.  Chest x-ray head CT negative for acute processes.  Abdominal CT negative.  No evidence for GI bleeding.  Urinalysis pending.  Continue treatment with lactulose and Xifaxan.  Will follow.  Discussed with the patient and her husband.  Wilhemina Bonito. Eda Keys., M.D. [facility] Division of Gastroenterology

## 2023-12-22 NOTE — Progress Notes (Signed)
Bilateral lower extremity venous duplex has been completed. Preliminary results can be found in CV Proc through chart review.   12/22/23 10:56 AM Olen Cordial RVT

## 2023-12-23 ENCOUNTER — Inpatient Hospital Stay (HOSPITAL_COMMUNITY): Payer: Medicare Other

## 2023-12-23 ENCOUNTER — Other Ambulatory Visit: Payer: Self-pay

## 2023-12-23 DIAGNOSIS — I639 Cerebral infarction, unspecified: Secondary | ICD-10-CM

## 2023-12-23 DIAGNOSIS — K7682 Hepatic encephalopathy: Secondary | ICD-10-CM | POA: Diagnosis not present

## 2023-12-23 DIAGNOSIS — I6389 Other cerebral infarction: Secondary | ICD-10-CM

## 2023-12-23 LAB — CBC WITH DIFFERENTIAL/PLATELET
Abs Immature Granulocytes: 0.04 10*3/uL (ref 0.00–0.07)
Basophils Absolute: 0 10*3/uL (ref 0.0–0.1)
Basophils Relative: 0 %
Eosinophils Absolute: 0.1 10*3/uL (ref 0.0–0.5)
Eosinophils Relative: 2 %
HCT: 34.4 % — ABNORMAL LOW (ref 36.0–46.0)
Hemoglobin: 11.3 g/dL — ABNORMAL LOW (ref 12.0–15.0)
Immature Granulocytes: 1 %
Lymphocytes Relative: 13 %
Lymphs Abs: 0.7 10*3/uL (ref 0.7–4.0)
MCH: 33.9 pg (ref 26.0–34.0)
MCHC: 32.8 g/dL (ref 30.0–36.0)
MCV: 103.3 fL — ABNORMAL HIGH (ref 80.0–100.0)
Monocytes Absolute: 0.9 10*3/uL (ref 0.1–1.0)
Monocytes Relative: 18 %
Neutro Abs: 3.5 10*3/uL (ref 1.7–7.7)
Neutrophils Relative %: 66 %
Platelets: 101 10*3/uL — ABNORMAL LOW (ref 150–400)
RBC: 3.33 MIL/uL — ABNORMAL LOW (ref 3.87–5.11)
RDW: 16.2 % — ABNORMAL HIGH (ref 11.5–15.5)
WBC: 5.3 10*3/uL (ref 4.0–10.5)
nRBC: 0 % (ref 0.0–0.2)

## 2023-12-23 LAB — COMPREHENSIVE METABOLIC PANEL
ALT: 37 U/L (ref 0–44)
AST: 43 U/L — ABNORMAL HIGH (ref 15–41)
Albumin: 2.3 g/dL — ABNORMAL LOW (ref 3.5–5.0)
Alkaline Phosphatase: 147 U/L — ABNORMAL HIGH (ref 38–126)
Anion gap: 8 (ref 5–15)
BUN: 18 mg/dL (ref 8–23)
CO2: 19 mmol/L — ABNORMAL LOW (ref 22–32)
Calcium: 8.6 mg/dL — ABNORMAL LOW (ref 8.9–10.3)
Chloride: 112 mmol/L — ABNORMAL HIGH (ref 98–111)
Creatinine, Ser: 0.77 mg/dL (ref 0.44–1.00)
GFR, Estimated: >60 mL/min (ref >60)
Glucose, Bld: 93 mg/dL (ref 70–99)
Potassium: 3.9 mmol/L (ref 3.5–5.1)
Sodium: 139 mmol/L (ref 135–145)
Total Bilirubin: 1.4 mg/dL — ABNORMAL HIGH (ref 0.0–1.2)
Total Protein: 5.7 g/dL — ABNORMAL LOW (ref 6.5–8.1)

## 2023-12-23 LAB — LIPID PANEL
Cholesterol: 121 mg/dL (ref 0–200)
HDL: 37 mg/dL — ABNORMAL LOW (ref >40)
LDL Cholesterol: 72 mg/dL (ref 0–99)
Total CHOL/HDL Ratio: 3.3 {ratio}
Triglycerides: 59 mg/dL (ref <150)
VLDL: 12 mg/dL (ref 0–40)

## 2023-12-23 LAB — PROTIME-INR
INR: 1.4 — ABNORMAL HIGH (ref 0.8–1.2)
Prothrombin Time: 17.4 s — ABNORMAL HIGH (ref 11.4–15.2)

## 2023-12-23 LAB — ECHOCARDIOGRAM COMPLETE BUBBLE STUDY
AR max vel: 1.81 cm2
AV Peak grad: 12.7 mm[Hg]
Ao pk vel: 1.78 m/s
Area-P 1/2: 3.37 cm2
S' Lateral: 3.3 cm

## 2023-12-23 LAB — T4, FREE: Free T4: 1.42 ng/dL — ABNORMAL HIGH (ref 0.61–1.12)

## 2023-12-23 LAB — TSH: TSH: 0.047 u[IU]/mL — ABNORMAL LOW (ref 0.350–4.500)

## 2023-12-23 LAB — CORTISOL-AM, BLOOD: Cortisol - AM: 10.4 ug/dL (ref 6.7–22.6)

## 2023-12-23 LAB — HEMOGLOBIN A1C
Hgb A1c MFr Bld: 4.8 % (ref 4.8–5.6)
Mean Plasma Glucose: 91.06 mg/dL

## 2023-12-23 MED ORDER — ASPIRIN 81 MG PO TBEC
81.0000 mg | DELAYED_RELEASE_TABLET | Freq: Every day | ORAL | Status: DC
  Administered 2023-12-23 – 2023-12-25 (×3): 81 mg via ORAL
  Filled 2023-12-23 (×3): qty 1

## 2023-12-23 MED ORDER — KETOROLAC TROMETHAMINE 15 MG/ML IJ SOLN
7.5000 mg | Freq: Four times a day (QID) | INTRAMUSCULAR | Status: DC | PRN
  Administered 2023-12-23: 7.5 mg via INTRAVENOUS
  Filled 2023-12-23: qty 1

## 2023-12-23 MED ORDER — STROKE: EARLY STAGES OF RECOVERY BOOK
Freq: Once | Status: AC
  Filled 2023-12-23: qty 1

## 2023-12-23 MED ORDER — FUROSEMIDE 20 MG PO TABS: 20.0000 mg | ORAL_TABLET | Freq: Every day | ORAL | Status: DC | PRN

## 2023-12-23 MED ORDER — SPIRONOLACTONE 12.5 MG HALF TABLET
12.5000 mg | ORAL_TABLET | Freq: Every day | ORAL | Status: DC
  Administered 2023-12-24 – 2023-12-25 (×2): 12.5 mg via ORAL
  Filled 2023-12-23 (×2): qty 1

## 2023-12-23 NOTE — Progress Notes (Signed)
Carotid artery duplex has been completed. Preliminary results can be found in CV Proc through chart review.   12/23/23 12:16 PM Olen Cordial RVT

## 2023-12-23 NOTE — Progress Notes (Signed)
Brief Interventional Radiology Note:  IR consulted for TIPS revision d/t hepatic encephalopathy. Patient found to have abnormal waveforms and velocity suggesting developing in stent stenosis on [place] ABD on 12/22/23 and elevated ammonia levels (87 on presentation) prompting consult. However, after MRI significant for Multiple small acute/early subacute infarcts in the left greater than right cerebral hemispheres and right cerebellum and Moderate chronic small vessel ischemic disease, and Neuro consulted for possible stroke workup, it was decided to hold off on TIPS revision until there is a better understanding of neuro pathology. Discussed the plan with patient and husband at bedside. Will plan for outpatient follow up for TIPS revision in 1-2 months at [facility] with Dr. Donne Hazel or Dr. Andrey Campanile. Per Neuro, patient has been started on ASA 81mg  daily; will need to hold for 5 days prior to procedure.   Electronically Signed: Jama Flavors, PA-C 12/23/2023, 12:31 PM

## 2023-12-23 NOTE — Progress Notes (Signed)
[facility] PROGRESS NOTE  [name] (DOB: 1951/04/08) WJX:[id number] PCP: Eden Emms, NP  Brief Narrative: [name] is a 73 y.o. female with a history of stage IIIb cecal adenocarcinoma s/p hemicolectomy and FOLFOX in 2004, hepatic cirrhosis with portal HTN, EVs s/p ligation, thrombocytopenia, s/p TIPS 2012, hx hepatic encephalopathy who was brought from home by her husband to the ED on 12/21/2023 with worsening encephalopathy. She was treated with augmented regimen for hepatic encephalopathy with GI consultation assistance and has improved. Work up included brain MRI which showed early subacute infarcts affecting multiple territories for which neurology was consulted and further work up is underway. Abd U/S showed narrowing of TIPS for which IR will defer revision to the outpatient setting.   Subjective: Much more interactive and conversant, though still with some unusual-for-her word blocking per her spouse at bedside. She does not feels a headache currently and her abdominal discomfort improved dramatically after large BMs overnight.   Objective: BP 119/68 (BP Location: Right Arm)   Pulse 66   Temp 97.7 F (36.5 C)   Resp 15   SpO2 96%   Gen: Frail pleasant female in no distress Pulm: Clear, nonlabored  CV: RRR, no MRG, mild nonpitting edema in LE's GI: Soft, NT, ND, +BS, some fullness without distention Neuro: Alert and oriented. Bradyphrenic with mild asterixis/tremor, subtle R EHL weakness compared to L.  Ext: Warm, no deformities Skin: No rashes, lesions or ulcers on visualized skin   Assessment & Plan: Principal Problem:   Hepatic encephalopathy (HCC) Active Problems:   Decompensated liver cirrhosis with portal HTN and gastric varices   Hypothyroidism   Essential hypertension   Altered mental status   Thrombocytopenia (HCC)  Acute multifactorial metabolic encephalopathy: Related to HE primarily though also related to subacute infarcts on MRI brain.  Thyroid derangements not likely to be causative at their current levels. Cortisol wnl.  - Continue treating primarily as hepatic encephalopathy with lactulose (augmented dosing currently) and newly added rifaximin. This has been cost prohibitive as outpatient maintenance therapy. Pt's spouse questions utility of prn rifaximin at home to avoid recurrent hospitalization. Defer to GI.   Acute CVAs: MRI showed distribution acute-early subacute ischemic infarcts of left greater than right cerebral hemispheres and right cerebellum.  - Due to contrast allergy (history of hives in 2013 despite uncomplicated CT w/contrast June 2024) and pt/family shared decision making, will pursue vascular imaging with MRA, d/w neurology at length.  - Echo pending - PT/OT/SLP - LDL 72 near goal, not planning to push statin with decompensated cirrhosis.  - Continue cardiac monitoring given suspicion for embolic etiology. If no indication for anticoagulation identified, 30-day cardiac monitoring after discharge is recommended.  - Risk/benefit of antiplatelet agents reviewed among neuro, medical, and family/patient > will start aspirin 81mg  monotherapy for now.   Oxaliplatin-induced hepatic cirrhosis: Chemo 2004, progressive cirrhosis w/TIPS 2012, followed by [facility], not transplant candidate apparently due to advanced age and adenocarcinoma.  - Check coags, daily CMP, recalculate MELD score, improved to 14. No ascites on U/S, though TIPS stenosis suspected based on velocities. IR planning outpatient revision, will need to hold antiplatelet for 5 days.  - Will again hold diuretics today.  Thrombocytopenia, macrocytic anemia: Stable, no bleeding currently - Monitor  Hypothyroidism: Last TSH in Oct 2024 was 0.20 (LLN 0.35) and had been trending downward for some time. TSH here confirmed again to be even lower at 0.047 with elevated free T4 at 1.42 (free T3 pending). Given  this trend even PTA, will plan to decrease  synthroid dose going forward.  - Decrease synthroid > at discharge.  - Recheck TFTs in ~4 weeks at endocrinology f/u.   History of adrenal insufficiency: Last cortisol was wnl, having weaned off hydrocortisone as outpatient. Again cortisol wnl at 10.4 12/22/2022.  - F/u with Dr. Roosevelt Locks    Hypotension: Has been on midodrine in the past. BP currently remains normotensive. In setting of strokes will avoid hypotension.  Hyponatremia: Hypovolemic, improved - Continue IVF  L1 compression fracture: Newly seen on CT this admit. No focal pain/tenderness at this time. Also with stable deofrmities at T10, T12, L2, L3.   Leukocytosis: In setting of otherwise hemoconcentrated labs, afebrile. No meningismus. UA negative, CXR negative, CT without nidus of infection. Has resolved without antimicrobial Tx. Will monitor without abx at this time.  History of stage IIIb cecal adenocarcinoma s/p hemicolectomy and FOLFOX: NED currently.   Tyrone Nine, MD [facility] [email or web address] 12/23/2023, 3:41 PM

## 2023-12-23 NOTE — Plan of Care (Signed)
Problem: Clinical Measurements: Goal: Ability to maintain clinical measurements within normal limits will improve Outcome: Progressing   Problem: Elimination: Goal: Will not experience complications related to bowel motility Outcome: Progressing

## 2023-12-23 NOTE — Evaluation (Signed)
SLP Cancellation Note  Patient Details Name: [name] MRN: [id number] DOB: [birth year]   Cancelled treatment:       Reason Eval/Treat Not Completed: Other (comment);Patient at procedure or test/unavailable (pt undergoing ECHO at this time; will continue efforts for speech/cog evaluation as ordered)   [name] 12/23/2023, 2:10 PM

## 2023-12-23 NOTE — Plan of Care (Signed)
Allergic to contrast - cancelling CTA head/necl. Ordering MRA head w/o and Carotid dopplers  Milon Dikes, MD

## 2023-12-23 NOTE — Evaluation (Signed)
Physical Therapy Evaluation Patient Details Name: [name] MRN: [id number] DOB: 1951-01-26 Today's Date: 12/23/2023  History of Present Illness  73 y.o. female who was brought from home by her husband to the ED on 12/21/2023 with worsening encephalopathy. Dx of Hepatic encephalopathy, MRI of brain showed  Multiple small acute/early subacute infarcts in the left greater  than right cerebral hemispheres and right cerebellum.  Imaging also showed L1 compression fracture.  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  Clinical Impression  Pt admitted with above diagnosis. Pt ambulated 86' with RW, no loss of balance. Noted MRI results showing acute/subacute infarcts. No deficits noted with strength, sensation, nor with balance. Pt did have some word finding difficulty. Good progress expected. Pt has excellent care from her spouse at home, no f/u PT indicated.  Pt currently with functional limitations due to the deficits listed below (see PT Problem List). Pt will benefit from acute skilled PT to increase their independence and safety with mobility to allow discharge.           If plan is discharge home, recommend the following: A little help with walking and/or transfers;A little help with bathing/dressing/bathroom;Assistance with cooking/housework;Assist for transportation;Help with stairs or ramp for entrance   Can travel by private vehicle        Equipment Recommendations None recommended by PT  Recommendations for Other Services       Functional Status Assessment Patient has had a recent decline in their functional status and demonstrates the ability to make significant improvements in function in a reasonable and predictable amount of time.     Precautions / Restrictions Precautions Precautions: Fall Precaution Comments:  denies falls in past 6 months Restrictions Weight Bearing Restrictions Per Provider Order: No      Mobility  Bed Mobility Overal bed mobility: Needs Assistance Bed Mobility: Supine to Sit     Supine to sit: Mod assist, HOB elevated     General bed mobility comments: assist to raise trunk and pivot hips to EOB    Transfers Overall transfer level: Needs assistance Equipment used: Rolling walker (2 wheels) Transfers: Sit to/from Stand Sit to Stand: Min assist           General transfer comment: VCs hand placement, min A to power up/steady; SPT to bedside commode    Ambulation/Gait Ambulation/Gait assistance: Contact guard assist Gait Distance (Feet): 85 Feet Assistive device: Rolling walker (2 wheels) Gait Pattern/deviations: Step-through pattern, Decreased stride length Gait velocity: decr     General Gait Details: steady, no loss of balance, HR 80s, SpO2 94% on room air; pt denied pain with activity  Stairs            Wheelchair Mobility     Tilt Bed    Modified Rankin (Stroke Patients Only)       Balance Overall balance assessment: Needs assistance Sitting-balance support: Feet supported, No upper extremity supported Sitting balance-Leahy Scale: Good     Standing balance support: Reliant on assistive device for balance, Bilateral upper extremity supported, During functional activity Standing balance-Leahy Scale: Fair                               Pertinent Vitals/Pain Pain Assessment Pain Assessment: No/denies pain    Home Living Family/patient expects to be discharged to:: Private  residence Living Arrangements: Spouse/significant other Available Help at Discharge: Family;Available PRN/intermittently Type of Home: House Home Access: Ramped entrance       Home Layout: One level Home Equipment: Rolling Walker (2 wheels);BSC/3in1;Cane - single point;Wheelchair - manual;Hospital bed      Prior Function Prior Level of  Function : Independent/Modified Independent             Mobility Comments: walks with SPC, retired Advice worker; denies falls in past 6 months ADLs Comments: independent     Extremity/Trunk Assessment   Upper Extremity Assessment Upper Extremity Assessment: Overall WFL for tasks assessed  B shoulder flexion +4/5, B grip +4/5  Lower Extremity Assessment Lower Extremity Assessment: Overall WFL for tasks assessed  B ankle DF/PF and knee extension 5/5  Cervical / Trunk Assessment Cervical / Trunk Assessment: Kyphotic  Communication   Communication Communication: Difficulty communicating thoughts/reduced clarity of speech (increased time to get some words out)  Cognition Arousal: Alert Behavior During Therapy: WFL for tasks assessed/performed Overall Cognitive Status: Impaired/Different from baseline                                 General Comments: some word finding difficulty; oriented x 4        General Comments      Exercises     Assessment/Plan    PT Assessment Patient needs continued PT services  PT Problem List Decreased activity tolerance;Decreased mobility       PT Treatment Interventions Gait training;DME instruction;Therapeutic exercise;Balance training;Therapeutic activities    PT Goals (Current goals can be found in the Care Plan section)  Acute Rehab PT Goals Patient Stated Goal: likes to read and watch her grandchildren play sports PT Goal Formulation: With patient/family Time For Goal Achievement: 01/06/24 Potential to Achieve Goals: Good    Frequency Min 1X/week     Co-evaluation               AM-PAC PT "6 Clicks" Mobility  Outcome Measure Help needed turning from your back to your side while in a flat bed without using bedrails?: None Help needed moving from lying on your back to sitting on the side of a flat bed without using bedrails?: A Little Help needed moving to and from a bed to a chair (including a  wheelchair)?: A Little Help needed standing up from a chair using your arms (e.g., wheelchair or bedside chair)?: A Little Help needed to walk in hospital room?: A Little Help needed climbing 3-5 steps with a railing? : A Little 6 Click Score: 19    End of Session Equipment Utilized During Treatment: Gait belt Activity Tolerance: Patient tolerated treatment well Patient left: in chair;with call bell/phone within reach;with family/visitor present Nurse Communication: Mobility status PT Visit Diagnosis: Other abnormalities of gait and mobility (R26.89)    Time: 1102-1130 PT Time Calculation (min) (ACUTE ONLY): 28 min   Charges:   PT Evaluation $PT Eval Moderate Complexity: 1 Mod PT Treatments $Gait Training: 8-22 mins PT General Charges $$ ACUTE PT VISIT: 1 Visit         Tamala Ser PT 12/23/2023  Acute Rehabilitation Services  Office [phone]

## 2023-12-23 NOTE — Progress Notes (Signed)
   12/23/23 1425  TOC Brief Assessment  Insurance and Status Reviewed  Patient has primary care physician Yes  Home environment has been reviewed Home w/ spouse  Prior level of function: modified independent  Prior/Current Home Services No current home services  Social Drivers of Health Review SDOH reviewed no interventions necessary  Readmission risk has been reviewed Yes  Transition of care needs no transition of care needs at this time

## 2023-12-23 NOTE — Progress Notes (Signed)
Progress Note  Primary GI: Dr. Marina Goodell DOA: 12/21/2023         Hospital Day: 3   Subjective  Chief Complaint:   Hepatic encephalopathy   Patient with family at bedside, [name] . Provided some of the history.  Patient significantly improved from encephalopathy standpoint.  She had 1 large bowel movement brown soft around 3 AM has not had any further. No nausea, vomiting, and abdominal discomfort. Patient had subacute infarcts on MRI and is currently getting workup just had carotid ultrasound. Doppler ultrasound liver did show some possible developing TIPS stent stenosis   Objective   Vital signs in last 24 hours: Temp:  [97.5 F (36.4 C)-98.2 F (36.8 C)] 97.6 F (36.4 C) (01/30 0409) Pulse Rate:  [66-75] 66 (01/30 0409) Resp:  [16] 16 (01/30 0409) BP: (107-117)/(63-66) 116/63 (01/30 0409) SpO2:  [95 %-97 %] 96 % (01/30 0409) Last BM Date : 12/22/23 Last BM recorded by nurses in past 5 days Stool Type: Type 6 (Mushy consistency with ragged edges) (12/23/2023  4:00 AM)  General : Well-developed, somnolent, ill-appearing female in no acute distress Heart:  regular rate and rhythm, no murmurs or gallops Pulm:  Clear anteriorly; no wheezing Abdomen:   Soft, Obese AB, skin exam normal, Normal bowel sounds. mild tenderness in the lower abdomen, some fullness. Without guarding and Without rebound, without hepatomegaly. no  fluid wave, no  shifting dullness.  Extremities:   With mild non pitting edema. Msk:  Symmetrical without gross deformities. Peripheral pulses intact.  Neurologic: Alert and  oriented x3; improved mentation, still slow speech, with improving tremors Skin:   without jaundice. no palmar erythema or spider angioma.   Psychiatric: Demonstrates reasonable judgment, slow mentation    Intake/Output from previous day: 01/29 0701 - 01/30 0700 In: 180 [P.O.:180] Out: 875 [Urine:875] Intake/Output this shift: No intake/output data recorded.  Studies/Results: MR BRAIN  WO CONTRAST Result Date: 12/22/2023 CLINICAL DATA:  Mental status change, unknown cause. Headache and weakness. EXAM: MRI HEAD WITHOUT CONTRAST TECHNIQUE: Multiplanar, multiecho pulse sequences of the brain and surrounding structures were obtained without intravenous contrast. COMPARISON:  Head CT 12/21/2023 and MRI 04/30/2022 FINDINGS: Brain: There are punctate cortical and subcortical acute to early subacute infarcts involving the left greater than right cerebral hemispheres (bilateral occipital, bilateral frontal, and left parietal lobes). Small acute infarcts are also present in the right caudate body and right cerebellar hemisphere. No intracranial hemorrhage, mass, midline shift, or extra-axial fluid collection is identified. Patchy T2 hyperintensities scattered throughout the cerebral white matter bilaterally are similar to the prior MRI and are nonspecific but compatible with moderate chronic small vessel ischemic disease. Cerebral volume is normal. The ventricles are normal in size. Symmetric T1 hyperintensity in the globi pallidi is likely related to the history of liver disease. Vascular: Major intracranial vascular flow voids are preserved. Skull and upper cervical spine: Unremarkable bone marrow signal. Sinuses/Orbits: Bilateral cataract extraction. Paranasal sinuses and mastoid air cells are clear. Other: None. IMPRESSION: 1. Multiple small acute/early subacute infarcts in the left greater than right cerebral hemispheres and right cerebellum. 2. Moderate chronic small vessel ischemic disease. Electronically Signed   By: Sebastian Ache M.D.   On: 12/22/2023 19:38   VAS [place] LOWER EXTREMITY VENOUS (DVT) Result Date: 12/22/2023  Lower Venous DVT Study Patient Name:  [name]  Date of Exam:   12/22/2023 Medical Rec #: [id number]        Accession #:    [id number] Date of Birth:  06/18/1951         Patient Gender: F Patient Age:   73 years Exam Location:  [facility] Procedure:      VAS [place] LOWER  EXTREMITY VENOUS (DVT) Referring Phys: Hazeline Junker --------------------------------------------------------------------------------  Indications: Swelling.  Risk Factors: None identified. Limitations: Poor ultrasound/tissue interface and patient pain tolerance. Comparison Study: No prior studies. Performing Technologist: Chanda Busing RVT  Examination Guidelines: A complete evaluation includes B-mode imaging, spectral Doppler, color Doppler, and power Doppler as needed of all accessible portions of each vessel. Bilateral testing is considered an integral part of a complete examination. Limited examinations for reoccurring indications may be performed as noted. The reflux portion of the exam is performed with the patient in reverse Trendelenburg.  +---------+---------------+---------+-----------+----------+--------------+ RIGHT    CompressibilityPhasicitySpontaneityPropertiesThrombus Aging +---------+---------------+---------+-----------+----------+--------------+ CFV      Full           Yes      No                                  +---------+---------------+---------+-----------+----------+--------------+ SFJ      Full                                                        +---------+---------------+---------+-----------+----------+--------------+ FV Prox  Full                                                        +---------+---------------+---------+-----------+----------+--------------+ FV Mid   Full                                                        +---------+---------------+---------+-----------+----------+--------------+ FV Distal               Yes      No                                  +---------+---------------+---------+-----------+----------+--------------+ PFV      Full                                                        +---------+---------------+---------+-----------+----------+--------------+ POP      Full           Yes      No                                   +---------+---------------+---------+-----------+----------+--------------+ PTV      Full                                                        +---------+---------------+---------+-----------+----------+--------------+  PERO     Full                                                        +---------+---------------+---------+-----------+----------+--------------+   +---------+---------------+---------+-----------+----------+--------------+ LEFT     CompressibilityPhasicitySpontaneityPropertiesThrombus Aging +---------+---------------+---------+-----------+----------+--------------+ CFV      Full           Yes      No                                  +---------+---------------+---------+-----------+----------+--------------+ SFJ      Full                                                        +---------+---------------+---------+-----------+----------+--------------+ FV Prox  Full                                                        +---------+---------------+---------+-----------+----------+--------------+ FV Mid   Full                                                        +---------+---------------+---------+-----------+----------+--------------+ FV Distal               Yes      No                                  +---------+---------------+---------+-----------+----------+--------------+ PFV      Full                                                        +---------+---------------+---------+-----------+----------+--------------+ POP      Full           Yes      No                                  +---------+---------------+---------+-----------+----------+--------------+ PTV      Full                                                        +---------+---------------+---------+-----------+----------+--------------+ PERO     Full                                                         +---------+---------------+---------+-----------+----------+--------------+  Summary: RIGHT: - There is no evidence of deep vein thrombosis in the lower extremity. However, portions of this examination were limited- see technologist comments above.  - No cystic structure found in the popliteal fossa.  LEFT: - There is no evidence of deep vein thrombosis in the lower extremity. However, portions of this examination were limited- see technologist comments above.  - No cystic structure found in the popliteal fossa.  *See table(s) above for measurements and observations. Electronically signed by Heath Lark on 12/22/2023 at 4:14:30 PM.    Final    [place] ABD LTD RUG W/LIVER DOPPLER Result Date: 12/22/2023 CLINICAL DATA:  73 year old female with liver decompensation. History of prior tips EXAM: DUPLEX ULTRASOUND OF LIVER TECHNIQUE: Color and duplex Doppler ultrasound was performed to evaluate the hepatic in-flow and out-flow vessels. COMPARISON:  None Available. FINDINGS: Portal Vein Velocities Main:  16 cm/sec Right:  53 cm/sec Left:  12 cm/sec Transjugular intrahepatic portosystemic shunt Velocities: Proximal: 46 centimeter/second Mid: 53 centimeter/second Distal: 49 centimeter/second Parvus tardus waveform Hepatic Vein Velocities Right:  20 cm/sec Middle:  19 cm/sec Left:  23 cm/sec Hepatic Artery Velocity:  133 cm/sec Splenic Vein Velocity:  21 cm/sec Varices: Documented positive Ascites: Negative Cholelithiasis without gallbladder wall thickening. Liver: Heterogeneous echogenicity.  No focal lesion. Gallbladder: Negative sonographic Murphy's sign. Echogenic foci within the gallbladder lumen with shadowing with the largest measuring 1.2 cm. No gallbladder wall thickening. Common bile duct: 2.4 mm IMPRESSION: TIPS is patent, however, abnormal waveforms and velocity suggesting developing in stent stenosis. Cholelithiasis without sonographic evidence of acute cholecystitis. The ultrasound documents varices of the  upper abdomen, and negative for ascites. Signed, Yvone Neu. Miachel Roux, RPVI [facility] Electronically Signed   By: Gilmer Mor D.O.   On: 12/22/2023 15:01   CT ABDOMEN PELVIS WO CONTRAST Result Date: 12/21/2023 CLINICAL DATA:  abdominal pain, nausea, fevers EXAM: CT ABDOMEN AND PELVIS WITHOUT CONTRAST TECHNIQUE: Multidetector CT imaging of the abdomen and pelvis was performed following the standard protocol without IV contrast. RADIATION DOSE REDUCTION: This exam was performed according to the departmental dose-optimization program which includes automated exposure control, adjustment of the mA and/or kV according to patient size and/or use of iterative reconstruction technique. COMPARISON: 04/25/2023 and previous FINDINGS: Lower chest: No pleural or pericardial effusion Hepatobiliary: . TIPS stent stable position. No focal liver lesion or biliary ductal dilatation. Pancreas: Unremarkable. No pancreatic ductal dilatation or surrounding inflammatory changes. Spleen: Normal in size without focal abnormality. Adrenals/Urinary Tract: Adrenal glands are unremarkable. Kidneys are normal, without renal calculi, focal lesion, or hydronephrosis. Bladder is unremarkable. Stomach/Bowel: Stomach nondistended, unremarkable. Small bowel decompressed. Post right hemicolectomy. Anastomotic staple line right mid abdomen. Remaining colon is incompletely distended, unremarkable. Vascular/Lymphatic: Mild scattered aortoiliac calcified plaque. No abdominal or pelvic adenopathy. Reproductive: Uterus and bilateral adnexa are unremarkable. Other: No abdominal ascites. No free air. Trace free fluid in the pelvis. Musculoskeletal: New compression fracture deformity of L1 with approximately 50% loss of height anteriorly, with diffuse sclerosis, no significant retropulsion. Stable compression fracture deformities of T10, T12, L2, and L3. IMPRESSION: 1. No acute findings. 2.  New L1 compression fracture deformity. 3.  Aortic Atherosclerosis (ICD10-I70.0). Electronically Signed   By: Corlis Leak M.D.   On: 12/21/2023 15:48   CT Head Wo Contrast Result Date: 12/21/2023 CLINICAL DATA:  Headache, increased frequency or severity. Nausea and vomiting. EXAM: CT HEAD WITHOUT CONTRAST TECHNIQUE: Contiguous axial images were obtained from the base of the skull through  the vertex without intravenous contrast. RADIATION DOSE REDUCTION: This exam was performed according to the departmental dose-optimization program which includes automated exposure control, adjustment of the mA and/or kV according to patient size and/or use of iterative reconstruction technique. COMPARISON:  02/09/2023 FINDINGS: Brain: The brain shows a normal appearance without evidence of malformation, atrophy, old or acute small or large vessel infarction, mass lesion, hemorrhage, hydrocephalus or extra-axial collection. Vascular: No significant finding. Skull: Normal.  No traumatic finding.  No focal bone lesion. Sinuses/Orbits: Sinuses are clear. Orbits appear normal. Mastoids are clear. Other: None significant IMPRESSION: Normal head CT. Electronically Signed   By: Paulina Fusi M.D.   On: 12/21/2023 15:34   DG Chest 2 View Result Date: 12/21/2023 CLINICAL DATA:  Cough. EXAM: CHEST - 2 VIEW COMPARISON:  Chest radiograph dated 04/21/2023. FINDINGS: No focal consolidation or pneumothorax. Probable trace left pleural effusion. Stable cardiac silhouette. No acute osseous pathology. A TIPS noted over the liver. IMPRESSION: No focal consolidation.  Trace left pleural effusion. Electronically Signed   By: Elgie Collard M.D.   On: 12/21/2023 14:55    Lab Results: Recent Labs    12/21/23 0855 12/22/23 0500 12/23/23 0546  WBC 13.6* 6.4 5.3  HGB 13.0 11.2* 11.3*  HCT 38.4 33.0* 34.4*  PLT 128* 104* 101*   BMET Recent Labs    12/21/23 0855 12/22/23 0500 12/23/23 0546  NA 130* 136 139  K 4.1 3.8 3.9  CL 95* 102  112*  CO2 23 23 19*  GLUCOSE 103* 97 93  BUN 22 21 18   CREATININE 0.99 0.65 0.77  CALCIUM 9.0 8.6* 8.6*   LFT Recent Labs    12/23/23 0546  PROT 5.7*  ALBUMIN 2.3*  AST 43*  ALT 37  ALKPHOS 147*  BILITOT 1.4*   PT/INR Recent Labs    12/23/23 0546  LABPROT 17.4*  INR 1.4*     Scheduled Meds:  [START ON 12/24/2023]  stroke: early stages of recovery book   Does not apply Once   aspirin EC  81 mg Oral Daily   lactulose  60 g Oral TID   levOCARNitine  330 mg Oral TID   rifaximin  550 mg Oral BID   Continuous Infusions:    Patient profile:   73 year old female history of urinary retention, OA, hypothyroidism, hypertension, history of colon cancer 2004 stage IIIb cecal adenocarcinoma status post surgery and chemotherapy, esophageal cancer, IDA, cirrhosis with portal hypertension, splenomegaly, thrombocytopenia, variceal bleed status post TIPS 2012 with history of repeated hepatic encephalopathy presents with worsening mentation and and decreased oral intake.   Impression/Plan:   Decompensated cirrhosis with HE AST 43 ALT 37 Alkphos 147 TBili 1.4 INR 12/23/2023 1.4  MELD 3.0: 14 at 12/23/2023  5:46 AM MELD-Na: 11 at 12/23/2023  5:46 AM Calculated from: Serum Creatinine: 0.77 mg/dL (Using min of 1 mg/dL) at 1/61/0960  4:54 AM Serum Sodium: 139 mmol/L (Using max of 137 mmol/L) at 12/23/2023  5:46 AM Total Bilirubin: 1.4 mg/dL at 0/98/1191  4:78 AM Serum Albumin: 2.3 g/dL at 2/95/6213  0:86 AM INR(ratio): 1.4 at 12/23/2023  5:46 AM Age at listing (hypothetical): 72 years Sex: Female at 12/23/2023  5:46 AM  -Doppler showed abnormal waveforms suggesting developing in-stent stenosis.  IR is aware and plans on outpatient follow-up of TIPS with potential revision in 1 to 2 months after neurology consult. - Serial INR, CBC, CMET daily. - Daily MELD -From a liver perspective patient is doing rather well continue lactulose titrating as needed,  continue Xifaxan -If are able to  continue Xifaxan outpatient can send in for the patient to pick up 30-day bottle versus can look into new Medicare medicine program or other resources for the patient.    Hepatic Encephalopathy:  Continue lactulose 60 mg 3 times daily, titrate to 3 bowel movements daily Continue Xifaxan 550 mg twice daily - minimize/remove all benzos and narcotics  Abnormal MRI Subacute infarcts, CT normal Being evaluated by neurology for potential source of infarct On low-dose aspirin   Ascites No evidence of ascites on exam.  No ascites seen on CT AB and pelvis Continue to monitor   Thrombocytopenia secondary to above Stable   Hypotension Improving Cortisol normal Monitor   AKI Monitor kidney function  Macrocytic anemia Hgb 11.2, no melena or hematochezia Potentially anemia of chronic disease/CKD  Principal Problem:   Hepatic encephalopathy (HCC) Active Problems:   Hypothyroidism   Essential hypertension   Decompensated liver cirrhosis with portal HTN and gastric varices   Altered mental status   Thrombocytopenia (HCC)    LOS: 1 day   Doree Albee  12/23/2023, 11:16 AM

## 2023-12-23 NOTE — Consult Note (Addendum)
NEUROLOGY CONSULT NOTE   Date of service: December 23, 2023 Patient Name: [name] MRN:  [id number] DOB:  1951-01-17 Chief Complaint: "Stroke on MRI" Requesting Provider: Tyrone Nine, MD  History of Present Illness  [name] is a 72 y.o. female with hx of stage IIIb cecal adenocarcinoma status post hemicolectomy and FOLFOX in 2004 cancer, esophageal cancer, hypertension, hypothyroidism, portal hypertension and liver cirrhosis, thrombocytopenia, splenomegaly, prior episodes of hepatic encephalopathy brought in for evaluation of worsening encephalopathy.  She presented to the ED on 12/21/2023 and the husband reported that she is not acting like herself.  Although she has had hepatic encephalopathy in the past, these episodes were different.  She was complaining of a headache and also had gait instability which is not normal.  She also had word finding difficulty. Because of the symptoms above, the hospitalist obtain an MRI of the brain that showed multiple small acute to early subacute infarcts in left greater than right cerebral hemispheres and right cerebellum. According to the husband, she was seen by cardiology last year for spikes in her blood pressure and was managed at [facility].  She was sent home with a 2-week heart monitor that did not reveal any concerning findings.  LKW: Multiple days ago. Modified rankin score: 2-Slight disability-UNABLE to perform all activities but does not need assistance IV Thrombolysis: Outside the window EVT: Outside the window  NIHSS components Score: Comment  1a Level of Conscious 0[x]  1[]  2[]  3[]      1b LOC Questions 0[x]  1[]  2[]       1c LOC Commands 0[x]  1[]  2[]       2 Best Gaze 0[x]  1[]  2[]       3 Visual 0[x]  1[]  2[]  3[]      4 Facial Palsy 0[x]  1[]  2[]  3[]      5a Motor Arm - left 0[x]  1[]  2[]  3[]  4[]  UN[]    5b Motor Arm - Right 0[x]  1[]  2[]  3[]  4[]  UN[]    6a Motor Leg - Left 0[]  1[x]  2[]  3[]  4[]  UN[]    6b Motor Leg - Right 0[x]   1[]  2[]  3[]  4[]  UN[]    7 Limb Ataxia 0[x]  1[]  2[]  3[]  UN[]     8 Sensory 0[x]  1[]  2[]  UN[]      9 Best Language 0[x]  1[]  2[]  3[]      10 Dysarthria 0[]  1[x]  2[]  UN[]      11 Extinct. and Inattention 0[x]  1[]  2[]       TOTAL: 2      ROS  Comprehensive ROS performed and pertinent positives documented in HPI    Past History   Past Medical History:  Diagnosis Date   Acute urinary retention 05/04/2022   Allergy 2006 ?   Contrast dye   Arthritis 2016 ??   Knees and thumb   Cancer [facility])    cecum   Cataract 2021   Surgery scheduled July 2023   Colon cancer [facility]) 2003   Elevated liver function tests    Esophageal varices (HCC)    Heart murmur On file   Hemorrhage of gastrointestinal tract 05/04/2011   Hypertension 2021   Hypothyroidism    Iron deficiency anemia    Liver disease    chemotherapy complication, per pt, shunts placed to bypass liver   Malignant neoplasm of cecum (HCC)    Portal hypertension (HCC)    Skin cancer 2019   Splenomegaly     Past Surgical History:  Procedure Laterality Date   COLON SURGERY  2004   Cancer  COSMETIC SURGERY  2021   Skin cancer   ESOPHAGEAL VARICE LIGATION     EYE SURGERY     HEMICOLECTOMY  01/08/2003   IR RADIOLOGIST EVAL & MGMT  12/20/2020   IR RADIOLOGIST EVAL & MGMT  05/29/2021   LIVER SURGERY     shunts placed after chemo complication   SKIN FULL THICKNESS GRAFT N/A 09/12/2019   Procedure: debridement and FTSG to the nose from left upper arm;  Surgeon: Allena Napoleon, MD;  Location: [facility];  Service: Plastics;  Laterality: N/A;  2 hours, please   TIPS PROCEDURE      Family History: Family History  Problem Relation Age of Onset   Arthritis Mother    Hearing loss Mother    Heart disease Mother    Hypertension Mother    Miscarriages / [place] Mother    Arthritis Father    Diabetes Father    Heart disease Father    Cancer Maternal Aunt    Breast cancer Neg Hx    Colon cancer Neg Hx    Esophageal  cancer Neg Hx    Pancreatic cancer Neg Hx    Stomach cancer Neg Hx     Social History  reports that she has never smoked. She has never used smokeless tobacco. She reports that she does not drink alcohol and does not use drugs.  Allergies  Allergen Reactions   Contrast Media [Iodinated Contrast Media] Hives    Medications   Current Facility-Administered Medications:    acetaminophen (TYLENOL) tablet 325 mg, 325 mg, Oral, Q6H PRN **OR** acetaminophen (TYLENOL) suppository 325 mg, 325 mg, Rectal, Q6H PRN, Bobette Mo, MD   lactulose [facility]) 10 GM/15ML solution 60 g, 60 g, Oral, TID, Doree Albee, PA-C, 60 g at 12/22/23 2213   levOCARNitine (CARNITOR) 1 GM/10ML solution 330 mg, 330 mg, Oral, TID, Bobette Mo, MD, 330 mg at 12/22/23 2210   ondansetron (ZOFRAN) tablet 4 mg, 4 mg, Oral, Q6H PRN **OR** ondansetron (ZOFRAN) injection 4 mg, 4 mg, Intravenous, Q6H PRN, Bobette Mo, MD, 4 mg at 12/22/23 0622   polyethylene glycol (MIRALAX / GLYCOLAX) packet 17 g, 17 g, Oral, Daily PRN, Bobette Mo, MD   prochlorperazine (COMPAZINE) injection 10 mg, 10 mg, Intravenous, Q6H PRN, Tyrone Nine, MD, 10 mg at 12/22/23 0735   rifaximin [employer]) tablet 550 mg, 550 mg, Oral, BID, Doree Albee, [place], 550 mg at 12/22/23 2211  Vitals   Vitals:   12/22/23 1144 12/22/23 1552 12/22/23 2356 12/23/23 0409  BP: 107/66 113/64 117/66 116/63  Pulse: 67 73 75 66  Resp: 16 16 16 16   Temp: 97.8 F (36.6 C) 98 F (36.7 C) (!) 97.5 F (36.4 C) 97.6 F (36.4 C)  TempSrc:  Oral    SpO2: 96% 97% 95% 96%    There is no height or weight on file to calculate BMI.  Physical Exam  General: Somewhat sick looking woman, comfortably laying in bed without any evidence of acute distress HEENT: Normocephalic atraumatic Lungs: Clear Cardiovascular: Regular rate rhythm Abdomen nondistended nontender Neurological exam She is awake alert oriented to self, the fact that she is  in the hospital and the month but could not tell me the correct year. Speech is mildly dysarthric There is no evidence of aphasia Diminished attention concentration Cranial nerves II to XII intact Motor examination with no drift in the upper extremities.  There is mild drift in the left lower extremity which  is weaker in comparison to the right lower extremity. Sensory exam: Intact to light touch without extinction Coordination examination reveals no dysmetria although she has mild asterixis on outstretched arms bilaterally.  Labs/Imaging/Neurodiagnostic studies   CBC:  Recent Labs  Lab 01-19-24 0500 12/23/23 0546  WBC 6.4 5.3  NEUTROABS  --  3.5  HGB 11.2* 11.3*  HCT 33.0* 34.4*  MCV 100.3* 103.3*  PLT 104* 101*   Basic Metabolic Panel:  Lab Results  Component Value Date   NA 139 12/23/2023   K 3.9 12/23/2023   CO2 19 (L) 12/23/2023   GLUCOSE 93 12/23/2023   BUN 18 12/23/2023   CREATININE 0.77 12/23/2023   CALCIUM 8.6 (L) 12/23/2023   GFRNONAA >60 12/23/2023   GFRAA >60 08/17/2020   Lipid Panel:  Lab Results  Component Value Date   LDLCALC 72 12/23/2023   HgbA1c:  Lab Results  Component Value Date   HGBA1C 5.1 08/14/2020   Alcohol Level     Component Value Date/Time   ETH <10 04/21/2023 1750   INR  Lab Results  Component Value Date   INR 1.4 (H) 12/23/2023   APTT  Lab Results  Component Value Date   APTT 38 (H) 02/09/2023   MRI Brain(Personally reviewed): IMPRESSION: 1. Multiple small acute/early subacute infarcts in the left greater than right cerebral hemispheres and right cerebellum. 2. Moderate chronic small vessel ischemic disease.  ASSESSMENT   [name] is a 73 y.o. female with above past medical history presenting with complaints of confusion and altered mental status that is different than her usual episodes of hepatic encephalopathy.  Also complained of a headache, gait disturbance and word finding difficulty. MRI brain reveals  acute/early subacute infarcts left greater than right cerebral hemispheres and in the right cerebellum.  Etiology-cryptogenic for now but strong suspicion for this being a embolic process given multiple vascular territories involved and cortical and subcortical strokes a very punctate nature seen.  Other possibility is concomitant small vessel disease.  Impression: Acute ischemic infarcts-cryptogenic etiology for now  RECOMMENDATIONS  Frequent neurochecks Telemetry-if no evidence of atrial fibrillation on telemetry, will need 30-day monitor outpatient. PT, OT, speech therapy assessments 2D echo, CTA head and neck A1c-goal less than 7. Lipid panel reveals an LDL of 72.  Goal LDL is less than 70.  She is near to the goal.  Given her hepatic dysfunction, I would hold off statins for now. Aspirin 81-discussed in detail with the husband and the patient that given her hepatic dysfunction and thrombocytopenia, she is high risk for bleeding but with the recent strokes, benefits of aspirin slightly outweigh the risks at this time and I would recommend that she be on aspirin 81 for now. Blood pressure goals-normotension-avoid hypotension. Medical management per primary team as you are Follow-up with outpatient stroke neurology clinic at [facility] in 8 to 12 weeks.  Neurology/stroke team at [facility] will follow the remainder of the results with you and chart check to provide final set of recommendations.  Plan discussed with Dr. Jarvis Newcomer   ______________________________________________________________________    Signed, Milon Dikes, MD [facility]

## 2023-12-24 ENCOUNTER — Inpatient Hospital Stay (HOSPITAL_COMMUNITY): Payer: Medicare Other

## 2023-12-24 DIAGNOSIS — D696 Thrombocytopenia, unspecified: Secondary | ICD-10-CM | POA: Diagnosis not present

## 2023-12-24 DIAGNOSIS — K7682 Hepatic encephalopathy: Secondary | ICD-10-CM | POA: Diagnosis not present

## 2023-12-24 DIAGNOSIS — K7290 Hepatic failure, unspecified without coma: Secondary | ICD-10-CM | POA: Diagnosis not present

## 2023-12-24 DIAGNOSIS — K7460 Unspecified cirrhosis of liver: Secondary | ICD-10-CM | POA: Diagnosis not present

## 2023-12-24 DIAGNOSIS — D539 Nutritional anemia, unspecified: Secondary | ICD-10-CM

## 2023-12-24 LAB — CBC
HCT: 34.3 % — ABNORMAL LOW (ref 36.0–46.0)
Hemoglobin: 11.4 g/dL — ABNORMAL LOW (ref 12.0–15.0)
MCH: 34 pg (ref 26.0–34.0)
MCHC: 33.2 g/dL (ref 30.0–36.0)
MCV: 102.4 fL — ABNORMAL HIGH (ref 80.0–100.0)
Platelets: 114 10*3/uL — ABNORMAL LOW (ref 150–400)
RBC: 3.35 MIL/uL — ABNORMAL LOW (ref 3.87–5.11)
RDW: 16 % — ABNORMAL HIGH (ref 11.5–15.5)
WBC: 5.3 10*3/uL (ref 4.0–10.5)
nRBC: 0 % (ref 0.0–0.2)

## 2023-12-24 LAB — PROTIME-INR
INR: 1.5 — ABNORMAL HIGH (ref 0.8–1.2)
Prothrombin Time: 17.8 s — ABNORMAL HIGH (ref 11.4–15.2)

## 2023-12-24 LAB — COMPREHENSIVE METABOLIC PANEL
ALT: 37 U/L (ref 0–44)
AST: 41 U/L (ref 15–41)
Albumin: 2.4 g/dL — ABNORMAL LOW (ref 3.5–5.0)
Alkaline Phosphatase: 162 U/L — ABNORMAL HIGH (ref 38–126)
Anion gap: 6 (ref 5–15)
BUN: 16 mg/dL (ref 8–23)
CO2: 19 mmol/L — ABNORMAL LOW (ref 22–32)
Calcium: 8.7 mg/dL — ABNORMAL LOW (ref 8.9–10.3)
Chloride: 109 mmol/L (ref 98–111)
Creatinine, Ser: 0.63 mg/dL (ref 0.44–1.00)
GFR, Estimated: >60 mL/min (ref >60)
Glucose, Bld: 83 mg/dL (ref 70–99)
Potassium: 3.7 mmol/L (ref 3.5–5.1)
Sodium: 134 mmol/L — ABNORMAL LOW (ref 135–145)
Total Bilirubin: 1.5 mg/dL — ABNORMAL HIGH (ref 0.0–1.2)
Total Protein: 5.9 g/dL — ABNORMAL LOW (ref 6.5–8.1)

## 2023-12-24 LAB — T3, FREE: T3, Free: 1.9 pg/mL — ABNORMAL LOW (ref 2.0–4.4)

## 2023-12-24 MED ORDER — FUROSEMIDE 20 MG PO TABS
20.0000 mg | ORAL_TABLET | Freq: Every day | ORAL | Status: DC
  Administered 2023-12-24 – 2023-12-25 (×2): 20 mg via ORAL
  Filled 2023-12-24 (×2): qty 1

## 2023-12-24 MED ORDER — LACTULOSE 10 GM/15ML PO SOLN
30.0000 g | Freq: Three times a day (TID) | ORAL | Status: DC
  Administered 2023-12-24 – 2023-12-25 (×4): 30 g via ORAL
  Filled 2023-12-24 (×4): qty 45

## 2023-12-24 MED ORDER — FUROSEMIDE 20 MG PO TABS: 20.0000 mg | ORAL_TABLET | Freq: Every day | ORAL | Status: DC | PRN

## 2023-12-24 MED ORDER — LEVOTHYROXINE SODIUM 25 MCG PO TABS
68.5000 ug | ORAL_TABLET | Freq: Every day | ORAL | Status: DC
  Administered 2023-12-24 – 2023-12-25 (×2): 68.5 ug via ORAL
  Filled 2023-12-24 (×2): qty 1

## 2023-12-24 NOTE — Progress Notes (Signed)
PT Cancellation Note  Patient Details Name: [name] MRN: [id number] DOB: 01-06-1951   Cancelled Treatment:     upon arriving to unit, Pt was amb with RN in hallway.  Pt has been evaluated with no post acute recommendations for PT.  No equipment needs. Acute Rehab to continue to follow.     Felecia Shelling  PTA Acute  Rehabilitation Services Office M-F          [phone]

## 2023-12-24 NOTE — Progress Notes (Addendum)
Progress Note  Primary GI: Dr. Marina Goodell DOA: 12/21/2023         Hospital Day: 4   Subjective  Chief Complaint:   Hepatic encephalopathy   Patient with family at bedside, cliff . Provided some of the history.  Patient had improved mentation but had significant diarrhea all night with lactulose 60 mg TID, had some hypothermia with this, has been able to wrap up with blankets with have improved.  Last BM was about 9, no melena, no hematochezia, that was smaller volume.    Objective   Vital signs in last 24 hours: Temp:  [94.5 F (34.7 C)-98.8 F (37.1 C)] 97.3 F (36.3 C) (01/31 0838) Pulse Rate:  [57-78] 71 (01/31 0918) Resp:  [15-16] 16 (01/31 0918) BP: (104-133)/(58-73) 120/67 (01/31 0918) SpO2:  [95 %-98 %] 96 % (01/31 0918) Last BM Date : 12/23/23 Last BM recorded by nurses in past 5 days Stool Type: Type 7 (Liquid consistency with no solid pieces) (12/24/2023  4:00 AM)  General : Well-developed, somnolent, ill-appearing female in no acute distress Heart:  regular rate and rhythm, no murmurs or gallops Pulm:  Clear anteriorly; no wheezing Abdomen:   Soft, Obese AB, skin exam normal, Normal bowel sounds. mild tenderness in the lower abdomen, some fullness. Without guarding and Without rebound, without hepatomegaly. no  fluid wave, no  shifting dullness.  Extremities:   With mild non pitting edema. Msk:  Symmetrical without gross deformities. Peripheral pulses intact.  Neurologic: Alert and  oriented x3; improved mentation, still slow speech, with improving tremors Skin:   without jaundice. no palmar erythema or spider angioma.   Psychiatric: Demonstrates reasonable judgment, slow mentation    Intake/Output from previous day: 01/30 0701 - 01/31 0700 In: -  Out: 1 [Stool:1] Intake/Output this shift: No intake/output data recorded.  Studies/Results: ECHOCARDIOGRAM COMPLETE BUBBLE STUDY Result Date: 12/23/2023    ECHOCARDIOGRAM REPORT   Patient Name:   [name] Date  of Exam: 12/23/2023 Medical Rec #:  [id number]       Height:       62.2 in Accession #:    [id number]      Weight:       153.0 lb Date of Birth:  1951-07-12        BSA:          1.711 m Patient Age:    73 years        BP:           119/68 mmHg Patient Gender: F               HR:           72 bpm. Exam Location:  Inpatient Procedure: 2D Echo, Cardiac Doppler, Color Doppler and Saline Contrast Bubble            Study Indications:    Stroke  History:        Patient has prior history of Echocardiogram examinations, most                 recent 04/22/2023. Signs/Symptoms:Murmur; Risk                 Factors:Hypertension.  Sonographer:    Webb Laws Referring Phys: [id number] Tyrone Nine IMPRESSIONS  1. Left ventricular ejection fraction, by estimation, is 60 to 65%. The left ventricle has normal function. The left ventricle has no regional wall motion abnormalities. Left ventricular diastolic parameters are consistent with Grade II diastolic dysfunction (  pseudonormalization). Elevated left ventricular end-diastolic pressure.  2. Right ventricular systolic function is normal. The right ventricular size is moderately enlarged. There is mildly elevated pulmonary artery systolic pressure.  3. Left atrial size was mildly dilated.  4. The mitral valve is degenerative. Trivial mitral valve regurgitation. No evidence of mitral stenosis. Severe mitral annular calcification.  5. The aortic valve is tricuspid. Aortic valve regurgitation is not visualized. Aortic valve sclerosis/calcification is present, without any evidence of aortic stenosis. Aortic valve Vmax measures 1.78 m/s.  6. The inferior vena cava is dilated in size with >50% respiratory variability, suggesting right atrial pressure of 8 mmHg.  7. Evidence of atrial level shunting detected by color flow Doppler. Agitated saline contrast bubble study was positive with shunting observed within 3-6 cardiac cycles suggestive of interatrial shunt. FINDINGS  Left Ventricle: Left  ventricular ejection fraction, by estimation, is 60 to 65%. The left ventricle has normal function. The left ventricle has no regional wall motion abnormalities. The left ventricular internal cavity size was normal in size. There is  no left ventricular hypertrophy. Left ventricular diastolic parameters are consistent with Grade II diastolic dysfunction (pseudonormalization). Elevated left ventricular end-diastolic pressure. Right Ventricle: The right ventricular size is moderately enlarged. No increase in right ventricular wall thickness. Right ventricular systolic function is normal. There is mildly elevated pulmonary artery systolic pressure. The tricuspid regurgitant velocity is 2.67 m/s, and with an assumed right atrial pressure of 8 mmHg, the estimated right ventricular systolic pressure is 36.5 mmHg. Left Atrium: Left atrial size was mildly dilated. Right Atrium: Right atrial size was normal in size. Pericardium: There is no evidence of pericardial effusion. Mitral Valve: The mitral valve is degenerative in appearance. There is mild calcification of the mitral valve leaflet(s). Severe mitral annular calcification. Trivial mitral valve regurgitation. No evidence of mitral valve stenosis. Tricuspid Valve: The tricuspid valve is normal in structure. Tricuspid valve regurgitation is trivial. No evidence of tricuspid stenosis. Aortic Valve: The aortic valve is tricuspid. Aortic valve regurgitation is not visualized. Aortic valve sclerosis/calcification is present, without any evidence of aortic stenosis. Aortic valve peak gradient measures 12.7 mmHg. Pulmonic Valve: The pulmonic valve was normal in structure. Pulmonic valve regurgitation is trivial. No evidence of pulmonic stenosis. Aorta: The aortic root is normal in size and structure. Venous: The inferior vena cava is dilated in size with greater than 50% respiratory variability, suggesting right atrial pressure of 8 mmHg. IAS/Shunts: Evidence of atrial level  shunting detected by color flow Doppler. Agitated saline contrast was given intravenously to evaluate for intracardiac shunting. Agitated saline contrast bubble study was positive with shunting observed within 3-6 cardiac cycles suggestive of interatrial shunt.  LEFT VENTRICLE PLAX 2D LVIDd:         5.20 cm   Diastology LVIDs:         3.30 cm   LV e' medial:    4.46 cm/s LV PW:         0.90 cm   LV E/e' medial:  26.5 LV IVS:        0.80 cm   LV e' lateral:   4.90 cm/s LVOT diam:     1.60 cm   LV E/e' lateral: 24.1 LV SV:         72 LV SV Index:   42 LVOT Area:     2.01 cm  RIGHT VENTRICLE             IVC RV Basal diam:  4.60 cm  IVC diam: 2.30 cm RV Mid diam:    2.90 cm RV S prime:     14.50 cm/s TAPSE (M-mode): 3.6 cm LEFT ATRIUM             Index        RIGHT ATRIUM           Index LA diam:        3.90 cm 2.28 cm/m   RA Area:     14.80 cm LA Vol (A2C):   53.8 ml 31.45 ml/m  RA Volume:   33.30 ml  19.47 ml/m LA Vol (A4C):   64.2 ml 37.53 ml/m LA Biplane Vol: 65.0 ml 38.00 ml/m  AORTIC VALVE AV Area (Vmax): 1.81 cm AV Vmax:        178.00 cm/s AV Peak Grad:   12.7 mmHg LVOT Vmax:      160.00 cm/s LVOT Vmean:     107.000 cm/s LVOT VTI:       0.358 m  AORTA Ao Root diam: 2.10 cm Ao Asc diam:  2.80 cm MITRAL VALVE                TRICUSPID VALVE MV Area (PHT): 3.37 cm     TR Peak grad:   28.5 mmHg MV Decel Time: 225 msec     TR Vmax:        267.00 cm/s MV E velocity: 118.00 cm/s MV A velocity: 145.00 cm/s  SHUNTS MV E/A ratio:  0.81         Systemic VTI:  0.36 m                             Systemic Diam: 1.60 cm Armanda Magic MD Electronically signed by Armanda Magic MD Signature Date/Time: 12/23/2023/3:44:29 PM    Final    VAS US CAROTID Result Date: 12/23/2023 Carotid Arterial Duplex Study Patient Name:  [name]  Date of Exam:   12/23/2023 Medical Rec #: [id number]        Accession #:    [id number] Date of Birth: July 26, 1951         Patient Gender: F Patient Age:   56 years Exam Location:  [facility] Procedure:      VAS US CAROTID Referring Phys: Milon Dikes --------------------------------------------------------------------------------  Indications:       CVA. Risk Factors:      Hypertension. Comparison Study:  No prior studies. Performing Technologist: Chanda Busing RVT  Examination Guidelines: A complete evaluation includes B-mode imaging, spectral Doppler, color Doppler, and power Doppler as needed of all accessible portions of each vessel. Bilateral testing is considered an integral part of a complete examination. Limited examinations for reoccurring indications may be performed as noted.  Right Carotid Findings: +----------+--------+--------+--------+-----------------------+--------+           PSV cm/sEDV cm/sStenosisPlaque Description     Comments +----------+--------+--------+--------+-----------------------+--------+ CCA Prox  93      15              smooth and heterogenoustortuous +----------+--------+--------+--------+-----------------------+--------+ CCA Distal146     43              smooth and heterogenous         +----------+--------+--------+--------+-----------------------+--------+ ICA Prox  110     33              smooth and heterogenous         +----------+--------+--------+--------+-----------------------+--------+ ICA Mid   97  43                                              +----------+--------+--------+--------+-----------------------+--------+ ICA Distal94      36                                     tortuous +----------+--------+--------+--------+-----------------------+--------+ ECA       148     13                                              +----------+--------+--------+--------+-----------------------+--------+ +----------+--------+-------+--------+-------------------+           PSV cm/sEDV cmsDescribeArm Pressure (mmHG) +----------+--------+-------+--------+-------------------+ Subclavian140                                         +----------+--------+-------+--------+-------------------+ +---------+--------+--+--------+--+---------+ VertebralPSV cm/s62EDV cm/s15Antegrade +---------+--------+--+--------+--+---------+  Left Carotid Findings: +----------+--------+--------+--------+-----------------------+--------+           PSV cm/sEDV cm/sStenosisPlaque Description     Comments +----------+--------+--------+--------+-----------------------+--------+ CCA Prox  104     26              smooth and heterogenous         +----------+--------+--------+--------+-----------------------+--------+ CCA Distal134     32              smooth and heterogenous         +----------+--------+--------+--------+-----------------------+--------+ ICA Prox  69      24              smooth and heterogenous         +----------+--------+--------+--------+-----------------------+--------+ ICA Mid   106     38                                              +----------+--------+--------+--------+-----------------------+--------+ ICA Distal88      39                                     tortuous +----------+--------+--------+--------+-----------------------+--------+ ECA       91      10                                              +----------+--------+--------+--------+-----------------------+--------+ +----------+--------+--------+--------+-------------------+           PSV cm/sEDV cm/sDescribeArm Pressure (mmHG) +----------+--------+--------+--------+-------------------+ [id number]                                         +----------+--------+--------+--------+-------------------+ +---------+--------+--+--------+--+---------+ VertebralPSV cm/s76EDV cm/s26Antegrade +---------+--------+--+--------+--+---------+   Summary: Right Carotid: Velocities in the right ICA are consistent with a 1-39% stenosis. Left Carotid: Velocities in the left ICA are consistent with a 1-39% stenosis.  Vertebrals: Bilateral  vertebral arteries demonstrate antegrade flow. *See table(s) above for measurements and observations.  Electronically signed by Sherald Hess MD on 12/23/2023 at 1:03:22 PM.    Final    MR BRAIN WO CONTRAST Result Date: 12/22/2023 CLINICAL DATA:  Mental status change, unknown cause. Headache and weakness. EXAM: MRI HEAD WITHOUT CONTRAST TECHNIQUE: Multiplanar, multiecho pulse sequences of the brain and surrounding structures were obtained without intravenous contrast. COMPARISON:  Head CT 12/21/2023 and MRI 04/30/2022 FINDINGS: Brain: There are punctate cortical and subcortical acute to early subacute infarcts involving the left greater than right cerebral hemispheres (bilateral occipital, bilateral frontal, and left parietal lobes). Small acute infarcts are also present in the right caudate body and right cerebellar hemisphere. No intracranial hemorrhage, mass, midline shift, or extra-axial fluid collection is identified. Patchy T2 hyperintensities scattered throughout the cerebral white matter bilaterally are similar to the prior MRI and are nonspecific but compatible with moderate chronic small vessel ischemic disease. Cerebral volume is normal. The ventricles are normal in size. Symmetric T1 hyperintensity in the globi pallidi is likely related to the history of liver disease. Vascular: Major intracranial vascular flow voids are preserved. Skull and upper cervical spine: Unremarkable bone marrow signal. Sinuses/Orbits: Bilateral cataract extraction. Paranasal sinuses and mastoid air cells are clear. Other: None. IMPRESSION: 1. Multiple small acute/early subacute infarcts in the left greater than right cerebral hemispheres and right cerebellum. 2. Moderate chronic small vessel ischemic disease. Electronically Signed   By: Sebastian Ache M.D.   On: 12/22/2023 19:38   VAS [place] LOWER EXTREMITY VENOUS (DVT) Result Date: 12/22/2023  Lower Venous DVT Study Patient Name:  [name]  Date  of Exam:   12/22/2023 Medical Rec #: [id number]        Accession #:    [id number] Date of Birth: 1951-07-07         Patient Gender: F Patient Age:   30 years Exam Location:  [facility] Procedure:      VAS [place] LOWER EXTREMITY VENOUS (DVT) Referring Phys: Hazeline Junker --------------------------------------------------------------------------------  Indications: Swelling.  Risk Factors: None identified. Limitations: Poor ultrasound/tissue interface and patient pain tolerance. Comparison Study: No prior studies. Performing Technologist: Chanda Busing RVT  Examination Guidelines: A complete evaluation includes B-mode imaging, spectral Doppler, color Doppler, and power Doppler as needed of all accessible portions of each vessel. Bilateral testing is considered an integral part of a complete examination. Limited examinations for reoccurring indications may be performed as noted. The reflux portion of the exam is performed with the patient in reverse Trendelenburg.  +---------+---------------+---------+-----------+----------+--------------+ RIGHT    CompressibilityPhasicitySpontaneityPropertiesThrombus Aging +---------+---------------+---------+-----------+----------+--------------+ CFV      Full           Yes      No                                  +---------+---------------+---------+-----------+----------+--------------+ SFJ      Full                                                        +---------+---------------+---------+-----------+----------+--------------+ FV Prox  Full                                                        +---------+---------------+---------+-----------+----------+--------------+  FV Mid   Full                                                        +---------+---------------+---------+-----------+----------+--------------+ FV Distal               Yes      No                                   +---------+---------------+---------+-----------+----------+--------------+ PFV      Full                                                        +---------+---------------+---------+-----------+----------+--------------+ POP      Full           Yes      No                                  +---------+---------------+---------+-----------+----------+--------------+ PTV      Full                                                        +---------+---------------+---------+-----------+----------+--------------+ PERO     Full                                                        +---------+---------------+---------+-----------+----------+--------------+   +---------+---------------+---------+-----------+----------+--------------+ LEFT     CompressibilityPhasicitySpontaneityPropertiesThrombus Aging +---------+---------------+---------+-----------+----------+--------------+ CFV      Full           Yes      No                                  +---------+---------------+---------+-----------+----------+--------------+ SFJ      Full                                                        +---------+---------------+---------+-----------+----------+--------------+ FV Prox  Full                                                        +---------+---------------+---------+-----------+----------+--------------+ FV Mid   Full                                                        +---------+---------------+---------+-----------+----------+--------------+  FV Distal               Yes      No                                  +---------+---------------+---------+-----------+----------+--------------+ PFV      Full                                                        +---------+---------------+---------+-----------+----------+--------------+ POP      Full           Yes      No                                   +---------+---------------+---------+-----------+----------+--------------+ PTV      Full                                                        +---------+---------------+---------+-----------+----------+--------------+ PERO     Full                                                        +---------+---------------+---------+-----------+----------+--------------+     Summary: RIGHT: - There is no evidence of deep vein thrombosis in the lower extremity. However, portions of this examination were limited- see technologist comments above.  - No cystic structure found in the popliteal fossa.  LEFT: - There is no evidence of deep vein thrombosis in the lower extremity. However, portions of this examination were limited- see technologist comments above.  - No cystic structure found in the popliteal fossa.  *See table(s) above for measurements and observations. Electronically signed by Heath Lark on 12/22/2023 at 4:14:30 PM.    Final     Lab Results: Recent Labs    12/22/23 0500 12/23/23 0546 12/24/23 0524  WBC 6.4 5.3 5.3  HGB 11.2* 11.3* 11.4*  HCT 33.0* 34.4* 34.3*  PLT 104* 101* 114*   BMET Recent Labs    12/22/23 0500 12/23/23 0546 12/24/23 0524  NA 136 139 134*  K 3.8 3.9 3.7  CL 102 112* 109  CO2 23 19* 19*  GLUCOSE 97 93 83  BUN 21 18 16   CREATININE 0.65 0.77 0.63  CALCIUM 8.6* 8.6* 8.7*   LFT Recent Labs    12/24/23 0524  PROT 5.9*  ALBUMIN 2.4*  AST 41  ALT 37  ALKPHOS 162*  BILITOT 1.5*   PT/INR Recent Labs    12/23/23 0546 12/24/23 0524  LABPROT 17.4* 17.8*  INR 1.4* 1.5*     Scheduled Meds:  aspirin EC  81 mg Oral Daily   furosemide  20 mg Oral Daily   lactulose  30 g Oral TID   levOCARNitine  330 mg Oral TID   levothyroxine  68.5 mcg Oral Q0600   rifaximin  550 mg Oral BID   spironolactone  12.5  mg Oral Daily   Continuous Infusions:    Patient profile:   73 year old female history of urinary retention, OA, hypothyroidism,  hypertension, history of colon cancer 2004 stage IIIb cecal adenocarcinoma status post surgery and chemotherapy, esophageal cancer, IDA, cirrhosis with portal hypertension, splenomegaly, thrombocytopenia, variceal bleed status post TIPS 2012 with history of repeated hepatic encephalopathy presents with worsening mentation and and decreased oral intake.   Impression/Plan:   Decompensated cirrhosis with HE AST 43 ALT 37 Alkphos 147 TBili 1.4 INR 12/23/2023 1.4  MELD 3.0: 17 at 12/24/2023  5:24 AM MELD-Na: 16 at 12/24/2023  5:24 AM Calculated from: Serum Creatinine: 0.63 mg/dL (Using min of 1 mg/dL) at 03/06/2439  1:02 AM Serum Sodium: 134 mmol/L at 12/24/2023  5:24 AM Total Bilirubin: 1.5 mg/dL at 06/17/3663  4:03 AM Serum Albumin: 2.4 g/dL at 4/74/2595  6:38 AM INR(ratio): 1.5 at 12/24/2023  5:24 AM Age at listing (hypothetical): 72 years Sex: Female at 12/24/2023  5:24 AM  -Doppler showed abnormal waveforms suggesting developing in-stent stenosis.  IR is aware and plans on outpatient follow-up of TIPS with potential revision in 1 to 2 months after neurology consult. - Serial INR, CBC, CMET daily. - Daily MELD - patient had some diarrhea last night with 60 mg lactulose TID, this has been cut back to 30 mg TID, continue to titrate and xifaxin 550MG  BID -If are able to continue Xifaxan outpatient can send in for the patient to pick up 30-day bottle versus can look into new Medicare medicine program or other resources for the patient. - patient is improving at this time, GI will sign off Please contact us if we can be of any further assistance during this hospital stay.    Hepatic Encephalopathy:  Continue lactulose 30 mg 3 times daily, titrate to 3 bowel movements daily Continue Xifaxan 550 mg twice daily - minimize/remove all benzos and narcotics  Abnormal MRI Subacute infarcts, CT normal Being evaluated by neurology for potential source of infarct On low-dose aspirin   Ascites No evidence  of ascites on exam.  No ascites seen on CT AB and pelvis Continue to monitor   Thrombocytopenia secondary to above Stable   Hypotension Improving Cortisol normal Monitor   AKI Monitor kidney function  Macrocytic anemia Hgb 11.2, no melena or hematochezia Potentially anemia of chronic disease/CKD  Principal Problem:   Hepatic encephalopathy (HCC) Active Problems:   Hypothyroidism   Essential hypertension   Decompensated liver cirrhosis with portal HTN and gastric varices   Altered mental status   Thrombocytopenia (HCC)    LOS: 2 days   Doree Albee  12/24/2023, 10:31 AM  GI ATTENDING  History data reviewed.  Agree with interval progress note as outlined above.  Agree with recommendations current dose of lactulose.  The goal is 3-5 bowel movements per day.  Also agree with discharging home on Xifaxan.  She can follow-up with her PCP.  Outpatient GI follow-up as needed.  Wilhemina Bonito. Eda Keys., M.D. [facility] Division of Gastroenterology

## 2023-12-24 NOTE — Progress Notes (Signed)
[facility] PROGRESS NOTE  Tandi Hanko (DOB: [birth year]) ZOX:[id number] PCP: Eden Emms, NP  Brief Narrative: [name] is a 73 y.o. female with a history of stage IIIb cecal adenocarcinoma s/p hemicolectomy and FOLFOX in 2004, hepatic cirrhosis with portal HTN, EVs s/p ligation, thrombocytopenia, s/p TIPS 2012, hx hepatic encephalopathy who was brought from home by her husband to the ED on 12/21/2023 with worsening encephalopathy. She was treated with augmented regimen for hepatic encephalopathy with GI consultation assistance and has improved. Work up included brain MRI which showed early subacute infarcts affecting multiple territories for which neurology was consulted and further work up is underway. Abd U/S showed narrowing of TIPS for which IR will defer revision to the outpatient setting.   Subjective: Interactive and without complaints. Spouse reports she's still having word blocking and dysarthria at times. She's eating well and feels better. Low temperature but feels well. Had BM's without knowing it and very frequent linen changes overnight.  Objective: BP 122/73 (BP Location: Right Arm)   Pulse 64   Temp (!) 97.3 F (36.3 C) (Oral)   Resp 16   SpO2 97%   Gen: Frail in no distress Pulm: Clear, nonlabored  CV: RRR, no MRG trace edema GI: Soft, NT, ND, +BS Neuro: Alert and oriented to day and month and location. No new focal deficits. Ext: Warm, no deformities, dry. Skin: No jaundice or new rashes, lesions or ulcers on visualized skin   Assessment & Plan: Principal Problem:   Hepatic encephalopathy (HCC) Active Problems:   Decompensated liver cirrhosis with portal HTN and gastric varices   Hypothyroidism   Essential hypertension   Altered mental status   Thrombocytopenia (HCC)  Hypothermia: Note cortisol sufficient, no other vital sign abnormalities and patient appears nontoxic. Patient has experienced this before without developing sepsis, etc. and I  suspect has impaired hepatic gluconeogenesis/glycolysis to support metabolic warming. She has awoken more this morning and ate breakfast and temperature is rising without bair hugger, so will cancel transfer order. GI losses may be contributing as well (spent all night getting bed linen changes).   Acute multifactorial metabolic encephalopathy: Related to HE primarily though also related to subacute infarcts on MRI brain. Thyroid derangements not likely to be causative at their current levels. Cortisol wnl.  - Continue treating primarily as hepatic encephalopathy with lactulose (augmented dosing which I will decrease back to 30g TID [open to alternative suggestions by GI, though pt's stool output has been severe and encephalopathy durably improved]) and newly added rifaximin. This has been cost prohibitive as outpatient maintenance therapy. Pt's spouse questions utility of prn rifaximin at home to avoid recurrent hospitalization. Defer to GI.   Acute CVAs: MRI showed distribution acute-early subacute ischemic infarcts of left greater than right cerebral hemispheres and right cerebellum.  - MRA pending (delayed due to staffing shortage). Carotid U/S negative.   - Echo showed no cardioembolic source but positive bubble study.  - PT/OT/SLP continue. - LDL 72 near goal, not planning to push statin with decompensated cirrhosis.  - Continue cardiac monitoring given suspicion for embolic etiology. If no indication for anticoagulation identified, 30-day cardiac monitoring after discharge is recommended.  - Risk/benefit of antiplatelet agents reviewed among neuro, medical, and family/patient > started aspirin 81mg  monotherapy for now.   Oxaliplatin-induced hepatic cirrhosis: Chemo 2004, progressive cirrhosis w/TIPS 2012, followed by [facility], not transplant candidate apparently due to advanced age and adenocarcinoma.  - Check coags, daily CMP, recalculate MELD score, improved to  14. No ascites on  U/S, though TIPS stenosis suspected based on velocities. IR planning outpatient revision, will need to hold antiplatelet for 5 days.  - Restart lasix/spiro at low doses and monitor BP. Echo showed preserved LVEF with elevated LVEDP.  Thrombocytopenia, macrocytic anemia: Stable, no bleeding currently - Monitor  Hypothyroidism: Last TSH in Oct 2024 was 0.20 (LLN 0.35) and had been trending downward for some time. TSH here confirmed again to be even lower at 0.047 with elevated free T4 at 1.42 (free T3 pending). Given this trend even PTA, will plan to decrease synthroid dose going forward.  - Decrease synthroid > at discharge.  - Recheck TFTs in ~4 weeks at endocrinology f/u.   History of adrenal insufficiency: Last cortisol was wnl, having weaned off hydrocortisone as outpatient. Again cortisol wnl at 10.4 12/22/2022.  - F/u with Dr. Roosevelt Locks    Hypotension: Has been on midodrine in the past. BP currently remains normotensive. In setting of strokes will avoid hypotension.  Hyponatremia: Hypovolemic, improved  L1 compression fracture: Newly seen on CT this admit. No focal pain/tenderness at this time. Also with stable deofrmities at T10, T12, L2, L3.   Leukocytosis: In setting of otherwise hemoconcentrated labs, afebrile. No meningismus. UA negative, CXR negative, CT without nidus of infection. Has resolved without antimicrobial Tx. Will monitor without abx at this time. Remains normal.  History of stage IIIb cecal adenocarcinoma s/p hemicolectomy and FOLFOX: NED currently.   Tyrone Nine, MD [facility] [email or web address] 12/24/2023, 9:00 AM

## 2023-12-24 NOTE — Progress Notes (Signed)
Rapid Response Event Note   Reason for Call : hypothermia   Initial Focused Assessment:  Patient is alert and oriented, all other VSS. Patient on bedside commode talking with NP upon entering the room. Patient states she is unable to stay under blankets long enough to keep temp up due to frequent BMs from lactulose.    Interventions:  Will attempt heat packs on the feet and KPAD with blankets to increase temperature.    Plan of Care:  Patient and husband state this is her baseline, temperature also drops at home, core temp normally 96.5. Husband warms her feet, and uses heating pads and a blanket at home to warm her. Will reassess in approx. 1 hour to see if interventions successful. If not successful, with tx to ICU for bear hugger.    Event Summary:    MD Notified: 0410  Call Time: 0411 Arrival Time: 0420 End Time: [phone]

## 2023-12-24 NOTE — Plan of Care (Signed)
  Problem: Education: Goal: Knowledge of General Education information will improve Description: Including pain rating scale, medication(s)/side effects and non-pharmacologic comfort measures Outcome: Progressing   Problem: Health Behavior/Discharge Planning: Goal: Ability to manage health-related needs will improve Outcome: Progressing   Problem: Clinical Measurements: Goal: Ability to maintain clinical measurements within normal limits will improve Outcome: Progressing Goal: Will remain free from infection Outcome: Progressing Goal: Diagnostic test results will improve Outcome: Progressing Goal: Respiratory complications will improve Outcome: Progressing Goal: Cardiovascular complication will be avoided Outcome: Progressing   Problem: Nutrition: Goal: Adequate nutrition will be maintained Outcome: Progressing   Problem: Activity: Goal: Risk for activity intolerance will decrease Outcome: Progressing   Problem: Coping: Goal: Level of anxiety will decrease Outcome: Progressing   Problem: Elimination: Goal: Will not experience complications related to bowel motility Outcome: Progressing Goal: Will not experience complications related to urinary retention Outcome: Progressing   Problem: Safety: Goal: Ability to remain free from injury will improve Outcome: Progressing   Problem: Skin Integrity: Goal: Risk for impaired skin integrity will decrease Outcome: Progressing   

## 2023-12-24 NOTE — Evaluation (Signed)
Speech Language Pathology Evaluation Patient Details Name: [name] MRN: [id number] DOB: 19-Jun-1951 Today's Date: 12/24/2023 Time: 1201-1229 SLP Time Calculation (min) (ACUTE ONLY): 28 min  Problem List:  Patient Active Problem List   Diagnosis Date Noted   Thrombocytopenia (HCC) 12/21/2023   Decreased GFR 12/10/2023   Preventative health care 09/09/2023   Lower extremity edema 08/10/2023   Cellulitis of both lower extremities 08/04/2023   Altered mental status 08/04/2023   Bradycardia 05/07/2023   Adrenal insufficiency (HCC) 03/19/2023   Protein-calorie malnutrition, severe (HCC) 02/26/2023   Weight loss 02/24/2023   Other constipation 02/24/2023   Generalized abdominal pain 02/24/2023   Hospital discharge follow-up 02/24/2023   Weakness 01/04/2023   Frequent UTI 05/18/2022   Anemia 05/18/2022   AKI (acute kidney injury) (HCC) 04/30/2022   Tachycardia-bradycardia syndrome (HCC) 02/05/2022   Hepatic encephalopathy (HCC) 02/03/2022   Basal cell carcinoma (BCC) 05/06/2021   Cecal cancer (HCC) 05/06/2021   Hypertension 05/06/2021   Decompensated liver cirrhosis with portal HTN and gastric varices 12/04/2020   Essential hypertension 08/12/2020   Murmur, cardiac 03/19/2020   Hypothyroidism 06/30/2019   History of basal cell cancer 06/30/2019   Hx of colon cancer, stage III 11/30/2011   Esophageal varices (HCC) 06/12/2010   Portal hypertension (HCC) 01/23/2008   Past Medical History:  Past Medical History:  Diagnosis Date   Acute urinary retention 05/04/2022   Allergy 2006 ?   Contrast dye   Arthritis 2016 ??   Knees and thumb   Cancer (HCC)    cecum   Cataract 2021   Surgery scheduled July 2023   Colon cancer [facility]) 2003   Elevated liver function tests    Esophageal varices (HCC)    Heart murmur On file   Hemorrhage of gastrointestinal tract 05/04/2011   Hypertension 2021   Hypothyroidism    Iron deficiency anemia    Liver disease    chemotherapy  complication, per pt, shunts placed to bypass liver   Malignant neoplasm of cecum (HCC)    Portal hypertension (HCC)    Skin cancer 2019   Splenomegaly    Past Surgical History:  Past Surgical History:  Procedure Laterality Date   COLON SURGERY  2004   Cancer   COSMETIC SURGERY  2021   Skin cancer   ESOPHAGEAL VARICE LIGATION     EYE SURGERY     HEMICOLECTOMY  01/08/2003   IR RADIOLOGIST EVAL & MGMT  12/20/2020   IR RADIOLOGIST EVAL & MGMT  05/29/2021   LIVER SURGERY     shunts placed after chemo complication   SKIN FULL THICKNESS GRAFT N/A 09/12/2019   Procedure: debridement and FTSG to the nose from left upper arm;  Surgeon: Allena Napoleon, MD;  Location: [facility];  Service: Plastics;  Laterality: N/A;  2 hours, please   TIPS PROCEDURE     HPI:  73 y.o. female who was brought from home by her husband to the ED on 12/21/2023 with worsening encephalopathy. Dx of Hepatic encephalopathy, MRI of brain showed  Multiple small acute/early subacute infarcts in the left greater  than right cerebral hemispheres and right cerebellum.  Imaging also showed L1 compression fracture.  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   Assessment / Plan / Recommendation Clinical Impression  Administered portion of SLUMS evaluation during session, limited by pt becoming tearful  and saying "I can't do this".  She is noted to have left facial asymmetry which may be her baseline and voice subjectively sounds hoarse.   Spouse reports her voice becomes hoarse when she has encephalopathy but also after use during thd day.  Pt was oriented to self, date and her home address.  Her language skills intact for her environment and she was able to follow directions for oral motor exam.  Pt demonstrates decreased sustained attention and decreased  organization.  These factors negate her ability to repeat 5 random words (able to state 1/5 only with two attempts) and she named 8 animals in 60 seconds.  Visuospatial skills intact.  No word finding deficits noted during conversational speech and pt was able to name multiple single objects without cues.  She has assist level at home from her spouse that is needed. OT can follow up for cognitive treatment to maximize her independence.  Advised spouse and pt to encouragement of intact language skills.    SLP Assessment  SLP Recommendation/Assessment: All further Speech Lanaguage Pathology  needs can be addressed in the next venue of care    Recommendations for follow up therapy are one component of a multi-disciplinary discharge planning process, led by the attending physician.  Recommendations may be updated based on patient status, additional functional criteria and insurance authorization.    Follow Up Recommendations  No SLP follow up    Assistance Recommended at Discharge  Other (comment)  Functional Status Assessment Patient has had a recent decline in their functional status and demonstrates the ability to make significant improvements in function in a reasonable and predictable amount of time. (follow up with OT)  Frequency and Duration           SLP Evaluation Cognition  Overall Cognitive Status: Impaired/Different from baseline Arousal/Alertness: Awake/alert Orientation Level: Oriented to person;Oriented to time Year: 2025 Month: January Day of Week: Correct Attention: Sustained Sustained Attention: Impaired Memory: Impaired Memory Impairment: Storage deficit (repeated 1/5 words with 2 attempts) Awareness: Appears intact (pt became tearful saying " I can't" when trying to repeat words) Safety/Judgment: Appears intact (pt acknowledges need to call for assist due to fall risk)       Comprehension  Auditory Comprehension Overall Auditory Comprehension: Appears within  functional limits for tasks assessed Yes/No Questions: Not tested Commands: Within Functional Limits (for single directions WFL) Conversation: Complex EffectiveTechniques: Extra processing time;Repetition Visual Recognition/Discrimination Discrimination: Not tested Reading Comprehension Reading Status: Not tested    Expression Verbal Expression Overall Verbal Expression: Appears within functional limits for tasks assessed Initiation: No impairment Repetition:  (WFL) Naming: No impairment Pragmatics: No impairment Non-Verbal Means of Communication: Not applicable Written Expression Dominant Hand: Right Written Expression: Not tested   Oral / Motor  Oral Motor/Sensory Function Overall Oral Motor/Sensory Function:  (pt with left facial asymmetry - however this may be baseline as reviewed prior pictures on spouse's phone from last fall) Motor Speech Overall Motor Speech: Appears within functional limits for tasks assessed Respiration: Within functional limits Phonation: Hoarse Resonance: Within functional limits Articulation: Within functional limitis Intelligibility: Intelligible Motor Planning: Not tested            [name] 12/24/2023, 12:54 PM Katherine Infante, MS [name] SLP Acute Rehab Services Office [phone]

## 2023-12-24 NOTE — Evaluation (Signed)
Occupational Therapy Evaluation Patient Details Name: [name] MRN: [id number] DOB: 22-May-1951 Today's Date: 12/24/2023   History of Present Illness 73 y.o. female who was brought from home by her husband to the ED on 12/21/2023 with worsening encephalopathy. Dx of Hepatic encephalopathy, MRI of brain showed  Multiple small acute/early subacute infarcts in the left greater  than right cerebral hemispheres and right cerebellum.  Imaging also showed L1 compression fracture.  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   Clinical Impression   Pt admitted with the above diagnosis and overall pt is doing fairly well but does have the deficits outlined below. Pt would benefit from cont OT to increase independence with basic adls back to her baseline of mod I.  Pt lives with her husband and does fairly well until her encephalopathy flares up.  Husband states this happens about every 3-4 months. When this happens, husband states he has a harder time managing things at home and could benefit from some help.  When she is stable, he feels like no assist is needed. Spoke to him about a home health aid and he was interested but stated they don't need this all the time but only when she is not feeling well. Instructed husband to talk to case manager about these services. Husband also stated that pt has always taken care of the bills and he does not have access to all the accounts to take over paying bills when she is cognitively unable to do so. Spoke with him about the option of getting joint access to these accounts so if she is in the hospital or cognitively cannot do this,  he could take over or at least help her with it.  Will continue to see acutely. Depending on how pt progresses, pt could benefit from OPOT to address cognition.        If plan is  discharge home, recommend the following: A little help with walking and/or transfers;A little help with bathing/dressing/bathroom;Assistance with cooking/housework;Help with stairs or ramp for entrance;Assist for transportation;Direct supervision/assist for medications management;Direct supervision/assist for financial management;Supervision due to cognitive status    Functional Status Assessment  Patient has had a recent decline in their functional status and demonstrates the ability to make significant improvements in function in a reasonable and predictable amount of time.  Equipment Recommendations  None recommended by OT    Recommendations for Other Services       Precautions / Restrictions Precautions Precautions: Fall Precaution Comments: denies falls in past 6 months Restrictions Weight Bearing Restrictions Per Provider Order: No      Mobility Bed Mobility Overal bed mobility: Needs Assistance Bed Mobility: Supine to Sit     Supine to sit: Supervision, HOB elevated     General bed mobility comments: No hands on assist needed. HOB at 90 and pt able to sit and scoot forward without assist.    Transfers Overall transfer level: Needs assistance Equipment used: Rolling walker (2 wheels) Transfers: Sit to/from Stand Sit to Stand: Contact guard assist           General transfer comment: VCs for hand placement.      Balance Overall balance assessment: Needs assistance Sitting-balance support: Feet supported, No upper extremity supported Sitting balance-Leahy Scale: Good     Standing balance support: Reliant on assistive device for balance, Bilateral upper extremity supported, During  functional activity Standing balance-Leahy Scale: Fair Standing balance comment: Pt could let go of walker for short amounts of time for toileting.  Required walker to ambulate.                           ADL either performed or assessed with clinical judgement   ADL  Overall ADL's : Needs assistance/impaired Eating/Feeding: Independent;Sitting   Grooming: Wash/dry hands;Wash/dry face;Oral care;Supervision/safety;Standing Grooming Details (indicate cue type and reason): Pt stood at sink to groom for 5 minutes with supervision. Upper Body Bathing: Set up;Sitting   Lower Body Bathing: Contact guard assist;Sit to/from stand   Upper Body Dressing : Set up;Sitting   Lower Body Dressing: Contact guard assist;Sit to/from stand   Toilet Transfer: Contact guard assist;Rolling walker (2 wheels);Ambulation;Grab bars;Comfort height toilet Toilet Transfer Details (indicate cue type and reason): Pt walked to bathroom. Cues for hand placement given when standing. No overt LOB. Pt walked slowly. Toileting- Clothing Manipulation and Hygiene: Supervision/safety;Sit to/from stand;Cueing for compensatory techniques       Functional mobility during ADLs: Supervision/safety;Rolling walker (2 wheels) General ADL Comments: Pt limited due to constant diarrhea during session and general weakness.     Vision Baseline Vision/History: 0 No visual deficits Ability to See in Adequate Light: 0 Adequate Patient Visual Report: No change from baseline Vision Assessment?: No apparent visual deficits     Perception Perception: Within Functional Limits       Praxis Praxis: WFL       Pertinent Vitals/Pain Pain Assessment Pain Assessment: No/denies pain     Extremity/Trunk Assessment Upper Extremity Assessment Upper Extremity Assessment: Overall WFL for tasks assessed   Lower Extremity Assessment Lower Extremity Assessment: Defer to PT evaluation   Cervical / Trunk Assessment Cervical / Trunk Assessment: Kyphotic   Communication Communication Communication: Difficulty communicating thoughts/reduced clarity of speech   Cognition Arousal: Alert Behavior During Therapy: WFL for tasks assessed/performed Overall Cognitive Status: Impaired/Different from baseline Area  of Impairment: Orientation, Problem solving                 Orientation Level: Time           Problem Solving: Slow processing, Decreased initiation, Difficulty sequencing, Requires verbal cues, Requires tactile cues General Comments: word finding deficits. Thought it was 2026.     General Comments  Pt most limited by slow processing time and slow movements as well as diarrhea.    Exercises     Shoulder Instructions      Home Living Family/patient expects to be discharged to:: Private residence Living Arrangements: Spouse/significant other Available Help at Discharge: Family;Available PRN/intermittently Type of Home: House Home Access: Ramped entrance     Home Layout: One level     Bathroom Shower/Tub: Tub/shower unit;Curtain   Bathroom Toilet: Standard Bathroom Accessibility: Yes How Accessible: Accessible via walker Home Equipment: Rolling Walker (2 wheels);BSC/3in1;Cane - single point;Wheelchair - manual;Hospital bed;Tub bench          Prior Functioning/Environment Prior Level of Function : Independent/Modified Independent             Mobility Comments: 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 Husband does all driving and helps with cooking. Had long discussion about pt doing the bills. When she is not ecephalopathic she does ok, but husband has to help when she is and currently does not have access to some of bank accounts.  Recommended them looking into this so if things are difficult for her as her cogntion changes, he can access accounts to be able to pay the bills.        OT Problem List: Impaired balance (sitting and/or standing);Decreased cognition      OT Treatment/Interventions: Self-care/ADL training;Cognitive remediation/compensation;DME and/or AE instruction;Balance training    OT Goals(Current goals can be found in the care plan section) Acute Rehab OT  Goals Patient Stated Goal: to get back home OT Goal Formulation: With patient Time For Goal Achievement: 01/07/24 Potential to Achieve Goals: Good ADL Goals Pt Will Perform Grooming: with modified independence;standing Pt Will Perform Lower Body Bathing: with modified independence;sit to/from stand Pt Will Perform Lower Body Dressing: with modified independence;sit to/from stand Pt Will Perform Tub/Shower Transfer: Tub transfer;with supervision;rolling walker;tub bench;ambulating Additional ADL Goal #1: Pt will complete all aspects of toileting with mod I using rolling walker.  OT Frequency: Min 1X/week    Co-evaluation              AM-PAC OT "6 Clicks" Daily Activity     Outcome Measure Help from another person eating meals?: None Help from another person taking care of personal grooming?: A Little Help from another person toileting, which includes using toliet, bedpan, or urinal?: A Little Help from another person bathing (including washing, rinsing, drying)?: A Little Help from another person to put on and taking off regular upper body clothing?: A Little Help from another person to put on and taking off regular lower body clothing?: A Little 6 Click Score: 19   End of Session Equipment Utilized During Treatment: Rolling walker (2 wheels) Nurse Communication: Mobility status  Activity Tolerance: Patient tolerated treatment well Patient left: in chair;with call bell/phone within reach;with family/visitor present  OT Visit Diagnosis: Unsteadiness on feet (R26.81);Other symptoms and signs involving cognitive function                Time: 1610-9604 OT Time Calculation (min): 43 min Charges:  OT General Charges $OT Visit: 1 Visit OT Evaluation $OT Eval Moderate Complexity: 1 Mod OT Treatments $Self Care/Home Management : 23-37 mins  Hope Budds 12/24/2023, 9:29 AM

## 2023-12-24 NOTE — Progress Notes (Signed)
    Patient Name: [name]           DOB: 04-24-1951  MRN: [id number]      Admission Date: 12/21/2023  Attending Provider: Tyrone Nine, MD  Primary Diagnosis: Hepatic encephalopathy [facility])   Level of care: Telemetry    CROSS COVER NOTE   Date of Service   12/24/2023   [name], 73 y.o. female, was admitted on 12/21/2023 for Hepatic encephalopathy (HCC).    HPI/Events of Note   Hypothermic- 94.5 F  husband is at bedside, informs [place] that "normal temperature" for patient is 23-97 F rectal.  Husband reports patient becomes hypothermic when encephalopathic. Routine at home when temperature drops --> heat pad and blankets to warm up.  Temperature usually normalizes within an hour.  Family would like to try this before using Bair hugger and transferring. On admission, noted to have leukocytosis which has now resolved without antibiotic use.  Patient has been afebrile.  Respiratory panel negative.  COVID-negative.  UA negative.  Chest x-ray negative.  CT without findings of infection. History of adrenal insufficiency.  Cortisol level WNL.   History of hypothyroidism, however has low TSH, elevated T4, T3 pending. Synthroid dose decreased.  If no improvement by 0600- will transfer pt to SDU for bair hugger.  Addendum 4098- temp 94.7 rectal. Transferring to SDU.    Interventions/ Plan   Above.         Anthoney Harada, DNP, ACNPC- AG [facility]

## 2023-12-24 NOTE — Plan of Care (Signed)
  Problem: Education: Goal: Knowledge of General Education information will improve Description: Including pain rating scale, medication(s)/side effects and non-pharmacologic comfort measures Outcome: Progressing   Problem: Clinical Measurements: Goal: Ability to maintain clinical measurements within normal limits will improve Outcome: Progressing Goal: Diagnostic test results will improve Outcome: Progressing   Problem: Safety: Goal: Ability to remain free from injury will improve Outcome: Progressing

## 2023-12-25 ENCOUNTER — Other Ambulatory Visit: Payer: Self-pay | Admitting: Home Health

## 2023-12-25 DIAGNOSIS — K7682 Hepatic encephalopathy: Secondary | ICD-10-CM | POA: Diagnosis not present

## 2023-12-25 DIAGNOSIS — I639 Cerebral infarction, unspecified: Secondary | ICD-10-CM

## 2023-12-25 MED ORDER — ASPIRIN 81 MG PO TBEC: 81.0000 mg | DELAYED_RELEASE_TABLET | Freq: Every day | ORAL | 0 refills | Status: AC

## 2023-12-25 MED ORDER — LACTULOSE 10 GM/15ML PO SOLN: 45.0000 g | Freq: Three times a day (TID) | ORAL | Status: DC

## 2023-12-25 MED ORDER — RIFAXIMIN 550 MG PO TABS: 550.0000 mg | ORAL_TABLET | Freq: Two times a day (BID) | ORAL | 0 refills | Status: AC

## 2023-12-25 NOTE — Discharge Summary (Signed)
Physician Discharge Summary   Patient: [name] MRN: [id number] DOB: [birth year]  Admit date:     12/21/2023  Discharge date: 12/25/23  Discharge Physician: Tyrone Nine   PCP: Eden Emms, NP   Recommendations at discharge:  Follow up with GI and hepatology after discharge. Prescribed rifaximin to be used largely prn (due to cost constraints) in addition to lactulose.  Continue aspirin 81mg  daily for secondary stroke prevention. Follow up with neurology in 8-12 weeks (referral order placed to [facility] prior to discharge).  Follow up with cardiology who has been contacted to arrange a 30-day cardiac monitor after discharge.  Follow up with endocrinology as scheduled 2/26 for history of adrenal insufficiency and active hypothyroidism with abnormal TFTs (see details below) Follow up with IR after discharge for TIPS revision (order placed for referral prior to discharge).   Discharge Diagnoses: Principal Problem:   Hepatic encephalopathy (HCC) Active Problems:   Decompensated liver cirrhosis with portal HTN and gastric varices   Hypothyroidism   Essential hypertension   Altered mental status   Thrombocytopenia [facility])  Hospital Course: [name] is a 73 y.o. female with a history of stage IIIb cecal adenocarcinoma s/p hemicolectomy and FOLFOX in 2004, hepatic cirrhosis with portal HTN, EVs s/p ligation, thrombocytopenia, s/p TIPS 2012, hx hepatic encephalopathy who was brought from home by her husband to the ED on 12/21/2023 with worsening encephalopathy. She was treated with augmented regimen for hepatic encephalopathy with GI consultation assistance and has improved. Work up included brain MRI which showed early subacute infarcts affecting multiple territories for which neurology was consulted and started aspirin, recommended follow up. Abd U/S showed narrowing of TIPS for which IR will defer revision to the outpatient setting.   Assessment and Plan: Hypothermia: RESOLVED. Note  cortisol sufficient, no other vital sign abnormalities and patient appears nontoxic. Patient has experienced this before without developing sepsis, etc. and I suspect has impaired hepatic gluconeogenesis/glycolysis to support metabolic warming. She has awoken more this morning and ate breakfast and temperature is rising without bair hugger, so will cancel transfer order. GI losses may be contributing as well (spent all night getting bed linen changes).    Acute multifactorial metabolic encephalopathy: Related to HE primarily though also related to subacute infarcts on MRI brain. Thyroid derangements not likely to be causative at their current levels. Cortisol wnl.  - Continue treating primarily as hepatic encephalopathy with lactulose and newly added rifaximin. This has been cost prohibitive as outpatient maintenance therapy. Pt's spouse questions utility of prn rifaximin at home to avoid recurrent hospitalization. Defer to GI who was ok with prescribing in this fashion, prescribed at discharge.    Acute CVAs: MRI showed distribution acute-early subacute ischemic infarcts of left greater than right cerebral hemispheres and right cerebellum.  - MRA negative. Carotid U/S negative.   - Echo showed no cardioembolic source but positive bubble study. LE venous U/S negative.  - PT/OT/SLP recommend no follow up. - LDL 72 near goal, not planning to push statin with decompensated cirrhosis.  - Cardiology contacted on day of discharge (Saturday) and will arrange 30-day cardiac monitoring after discharge  - Risk/benefit of antiplatelet agents reviewed among neuro, medical, and family/patient > started aspirin 81mg  monotherapy for now.    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 MELD score, improved to 14. No ascites on U/S, though TIPS  stenosis suspected based on velocities. IR planning outpatient revision, will need to hold antiplatelet for 5 days.  - Restart lasix/spiro, having LE edema since holding diuretics due to soft BP at admission. Echo showed preserved LVEF  - Continue routine follow up.  - Follow up with IR for TIPS revision.    Thrombocytopenia, macrocytic anemia: Stable, no bleeding currently - Monitor   Hypothyroidism: Last TSH in Oct 2024 was 0.20 (LLN 0.35) and had been trending downward for some time. TSH here confirmed again to be low at 0.047 with elevated free T4 at 1.42 but low free T3 (1.9). - Unclear what contribution her current illness is having on her labs and she is clinically euthyroid, so will not adjust synthroid at this time. Pt has follow up with endocrinology in a few weeks.      History of adrenal insufficiency: Last cortisol was wnl, having weaned off hydrocortisone as outpatient. Again cortisol wnl at 10.4 12/22/2022.  - F/u with Dr. Roosevelt Locks     Hypotension: Has been on midodrine in the past. BP currently remains normotensive. In setting of strokes will avoid hypotension.   Hyponatremia: Hypovolemic, improved   L1 compression fracture: Newly seen on CT this admit. No focal pain/tenderness at this time. Also with stable deofrmities at T10, T12, L2, L3.    Leukocytosis: In setting of otherwise hemoconcentrated labs, afebrile. No meningismus. UA negative, CXR negative, CT without nidus of infection. Has resolved without antimicrobial Tx. Will monitor without abx at this time. Remains normalized.   History of stage IIIb cecal adenocarcinoma s/p hemicolectomy and FOLFOX: NED currently.   Consultants: GI, IR, neurology Procedures performed: None  Disposition: Home Diet recommendation: 2g sodium restriction DISCHARGE MEDICATION: Allergies as of 12/25/2023       Reactions   Contrast Media [iodinated Contrast Media] Hives        Medication List     TAKE these medications     acidophilus Caps capsule Take 1 capsule by mouth daily.   aspirin EC 81 MG tablet Take 1 tablet (81 mg total) by mouth daily. Swallow whole. Start taking on: December 26, 2023   CRANBERRY PO Take 1 tablet by mouth 2 (two) times daily.   estradiol 0.1 MG/GM vaginal cream Commonly known as: ESTRACE Place 1 Applicatorful vaginally every other day.   furosemide 20 MG tablet Commonly known as: LASIX Take 20-40 mg by mouth See admin instructions. Take blood pressure and depending on reading take 20mg  or 40mg  by mouth daily.   lactulose 10 GM/15ML solution Commonly known as: CHRONULAC Take 45 mLs (30 g total) by mouth 3 (three) times daily.   levOCARNitine 330 MG tablet Commonly known as: CARNITOR Take 330 mg by mouth 3 (three) times daily.   liver oil-zinc oxide 40 % ointment Commonly known as: DESITIN Apply topically daily as needed for irritation.   Multi-Vitamin tablet Take 1 tablet by mouth daily.   polyethylene glycol 17 g packet Commonly known as: MIRALAX / GLYCOLAX Take 17 g by mouth daily as needed for mild constipation.   rifaximin 550 MG Tabs tablet Commonly known as: XIFAXAN Take 1 tablet (550 mg total) by mouth 2 (two) times daily.   spironolactone 25 MG tablet Commonly known as: ALDACTONE Take 50 mg by mouth daily.   Synthroid 75 MCG tablet Generic drug: levothyroxine TAKE 1 TABLET BY MOUTH DAILY BEFORE BREAKFAST. MUST BE SEEN IN OFFICE FOR MORE REFILLS        Follow-up Information     Hewitt,  Genene Churn, NP Follow up.   Specialties: Nurse Practitioner, Family Medicine Contact information: [address] [place] [address] [phone]         [employer], MD Follow up.   Specialty: Endocrinology Contact information: [address] [place] [address] [place] [address] [phone]         Hilarie Fredrickson, MD Follow up.   Specialty: Gastroenterology Contact information: [address] [place] [address] [phone]                 Discharge Exam: BP 130/70 (BP Location: Right Arm)   Pulse 70   Temp (!) 97.5 F (36.4 C)   Resp 16   SpO2 95%   No distress, had some hallucinations this morning, but remains overall much improved.  Clear, nonlabored RRR, no MRG, +LE edema without JVD Soft, NT, ND, +BS Alert, oriented, interactive, fine tremor without asterixis  Condition at discharge: stable  The results of significant diagnostics from this hospitalization (including imaging, microbiology, ancillary and laboratory) are listed below for reference.   Imaging Studies: MR ANGIO HEAD WO CONTRAST Result Date: 12/24/2023 CLINICAL DATA:  Stroke, follow-up. EXAM: MRA HEAD WITHOUT CONTRAST TECHNIQUE: Angiographic images of the Circle of Willis were acquired using MRA technique without intravenous contrast. COMPARISON:  None Available. FINDINGS: Anterior circulation: The visualized portions of the distal cervical and intracranial internal carotid arteries are widely patent with normal flow related enhancement. The bilateral anterior cerebral arteries and middle cerebral arteries are widely patent with anterograde flow without high-grade flow-limiting stenosis or proximal branch occlusion. No intracranial aneurysm within the anterior circulation. Posterior circulation: The vertebral arteries are widely patent with anterograde flow. Vertebrobasilar junction and basilar artery are widely patent with anterograde flow without evidence of basilar stenosis or aneurysm. Posterior cerebral arteries are normal bilaterally. No intracranial aneurysm within the posterior circulation. Anatomic variants: None significant. Other: None. IMPRESSION: Negative intracranial MRA. Electronically Signed   By: Baldemar Lenis M.D.   On: 12/24/2023 13:34   ECHOCARDIOGRAM COMPLETE BUBBLE STUDY Result Date: 12/23/2023    ECHOCARDIOGRAM REPORT   Patient Name:   DUANA BENEDICT Date of Exam: 12/23/2023 Medical Rec #:  [id number]        Height:       62.2 in Accession #:    [id number]      Weight:       153.0 lb Date of Birth:  [birth year]        BSA:          1.711 m Patient Age:    72 years        BP:           119/68 mmHg Patient Gender: F               HR:           72 bpm. Exam Location:  Inpatient Procedure: 2D Echo, Cardiac Doppler, Color Doppler and Saline Contrast Bubble            Study Indications:    Stroke  History:        Patient has prior history of Echocardiogram examinations, most                 recent 04/22/2023. Signs/Symptoms:Murmur; Risk                 Factors:Hypertension.  Sonographer:    Webb Laws Referring Phys: [id number] Tyrone Nine IMPRESSIONS  1. Left ventricular  ejection fraction, by estimation, is 60 to 65%. The left ventricle has normal function. The left ventricle has no regional wall motion abnormalities. Left ventricular diastolic parameters are consistent with Grade II diastolic dysfunction (pseudonormalization). Elevated left ventricular end-diastolic pressure.  2. Right ventricular systolic function is normal. The right ventricular size is moderately enlarged. There is mildly elevated pulmonary artery systolic pressure.  3. Left atrial size was mildly dilated.  4. The mitral valve is degenerative. Trivial mitral valve regurgitation. No evidence of mitral stenosis. Severe mitral annular calcification.  5. The aortic valve is tricuspid. Aortic valve regurgitation is not visualized. Aortic valve sclerosis/calcification is present, without any evidence of aortic stenosis. Aortic valve Vmax measures 1.78 m/s.  6. The inferior vena cava is dilated in size with >50% respiratory variability, suggesting right atrial pressure of 8 mmHg.  7. Evidence of atrial level shunting detected by color flow Doppler. Agitated saline contrast bubble study was positive with shunting observed within 3-6 cardiac cycles suggestive of interatrial shunt. FINDINGS  Left Ventricle: Left ventricular ejection fraction, by estimation, is 60 to  65%. The left ventricle has normal function. The left ventricle has no regional wall motion abnormalities. The left ventricular internal cavity size was normal in size. There is  no left ventricular hypertrophy. Left ventricular diastolic parameters are consistent with Grade II diastolic dysfunction (pseudonormalization). Elevated left ventricular end-diastolic pressure. Right Ventricle: The right ventricular size is moderately enlarged. No increase in right ventricular wall thickness. Right ventricular systolic function is normal. There is mildly elevated pulmonary artery systolic pressure. The tricuspid regurgitant velocity is 2.67 m/s, and with an assumed right atrial pressure of 8 mmHg, the estimated right ventricular systolic pressure is 36.5 mmHg. Left [facility]: Left atrial size was mildly dilated. Right [facility]: Right atrial size was normal in size. Pericardium: There is no evidence of pericardial effusion. Mitral Valve: The mitral valve is degenerative in appearance. There is mild calcification of the mitral valve leaflet(s). Severe mitral annular calcification. Trivial mitral valve regurgitation. No evidence of mitral valve stenosis. Tricuspid Valve: The tricuspid valve is normal in structure. Tricuspid valve regurgitation is trivial. No evidence of tricuspid stenosis. Aortic Valve: The aortic valve is tricuspid. Aortic valve regurgitation is not visualized. Aortic valve sclerosis/calcification is present, without any evidence of aortic stenosis. Aortic valve peak gradient measures 12.7 mmHg. Pulmonic Valve: The pulmonic valve was normal in structure. Pulmonic valve regurgitation is trivial. No evidence of pulmonic stenosis. Aorta: The aortic root is normal in size and structure. Venous: The inferior vena cava is dilated in size with greater than 50% respiratory variability, suggesting right atrial pressure of 8 mmHg. IAS/Shunts: Evidence of atrial level shunting detected by color flow Doppler. Agitated saline  contrast was given intravenously to evaluate for intracardiac shunting. Agitated saline contrast bubble study was positive with shunting observed within 3-6 cardiac cycles suggestive of interatrial shunt.  LEFT VENTRICLE PLAX 2D LVIDd:         5.20 cm   Diastology LVIDs:         3.30 cm   LV e' medial:    4.46 cm/s LV PW:         0.90 cm   LV E/e' medial:  26.5 LV IVS:        0.80 cm   LV e' lateral:   4.90 cm/s LVOT diam:     1.60 cm   LV E/e' lateral: 24.1 LV SV:         72 LV SV Index:  42 LVOT Area:     2.01 cm  RIGHT VENTRICLE             IVC RV Basal diam:  4.60 cm     IVC diam: 2.30 cm RV Mid diam:    2.90 cm RV S prime:     14.50 cm/s TAPSE (M-mode): 3.6 cm LEFT ATRIUM             Index        RIGHT ATRIUM           Index LA diam:        3.90 cm 2.28 cm/m   RA Area:     14.80 cm LA Vol (A2C):   53.8 ml 31.45 ml/m  RA Volume:   33.30 ml  19.47 ml/m LA Vol (A4C):   64.2 ml 37.53 ml/m LA Biplane Vol: 65.0 ml 38.00 ml/m  AORTIC VALVE AV Area (Vmax): 1.81 cm AV Vmax:        178.00 cm/s AV Peak Grad:   12.7 mmHg LVOT Vmax:      160.00 cm/s LVOT Vmean:     107.000 cm/s LVOT VTI:       0.358 m  AORTA Ao Root diam: 2.10 cm Ao Asc diam:  2.80 cm MITRAL VALVE                TRICUSPID VALVE MV Area (PHT): 3.37 cm     TR Peak grad:   28.5 mmHg MV Decel Time: 225 msec     TR Vmax:        267.00 cm/s MV E velocity: 118.00 cm/s MV A velocity: 145.00 cm/s  SHUNTS MV E/A ratio:  0.81         Systemic VTI:  0.36 m                             Systemic Diam: 1.60 cm Armanda Magic MD Electronically signed by Armanda Magic MD Signature Date/Time: 12/23/2023/3:44:29 PM    Final    VAS US CAROTID Result Date: 12/23/2023 Carotid Arterial Duplex Study Patient Name:  JENAI SCALETTA  Date of Exam:   12/23/2023 Medical Rec #: [id number]        Accession #:    [id number] Date of Birth: Sep 03, 1951         Patient Gender: F Patient Age:   13 years Exam Location:  [facility] Procedure:      VAS US CAROTID Referring Phys:  Milon Dikes --------------------------------------------------------------------------------  Indications:       CVA. Risk Factors:      Hypertension. Comparison Study:  No prior studies. Performing Technologist: Chanda Busing RVT  Examination Guidelines: A complete evaluation includes B-mode imaging, spectral Doppler, color Doppler, and power Doppler as needed of all accessible portions of each vessel. Bilateral testing is considered an integral part of a complete examination. Limited examinations for reoccurring indications may be performed as noted.  Right Carotid Findings: +----------+--------+--------+--------+-----------------------+--------+           PSV cm/sEDV cm/sStenosisPlaque Description     Comments +----------+--------+--------+--------+-----------------------+--------+ CCA Prox  93      15              smooth and heterogenoustortuous +----------+--------+--------+--------+-----------------------+--------+ CCA Distal146     43              smooth and heterogenous         +----------+--------+--------+--------+-----------------------+--------+ ICA Prox  110  33              smooth and heterogenous         +----------+--------+--------+--------+-----------------------+--------+ ICA Mid   97      43                                              +----------+--------+--------+--------+-----------------------+--------+ ICA Distal94      36                                     tortuous +----------+--------+--------+--------+-----------------------+--------+ ECA       148     13                                              +----------+--------+--------+--------+-----------------------+--------+ +----------+--------+-------+--------+-------------------+           PSV cm/sEDV cmsDescribeArm Pressure (mmHG) +----------+--------+-------+--------+-------------------+ Subclavian140                                         +----------+--------+-------+--------+-------------------+ +---------+--------+--+--------+--+---------+ VertebralPSV cm/s62EDV cm/s15Antegrade +---------+--------+--+--------+--+---------+  Left Carotid Findings: +----------+--------+--------+--------+-----------------------+--------+           PSV cm/sEDV cm/sStenosisPlaque Description     Comments +----------+--------+--------+--------+-----------------------+--------+ CCA Prox  104     26              smooth and heterogenous         +----------+--------+--------+--------+-----------------------+--------+ CCA Distal134     32              smooth and heterogenous         +----------+--------+--------+--------+-----------------------+--------+ ICA Prox  69      24              smooth and heterogenous         +----------+--------+--------+--------+-----------------------+--------+ ICA Mid   106     38                                              +----------+--------+--------+--------+-----------------------+--------+ ICA Distal88      39                                     tortuous +----------+--------+--------+--------+-----------------------+--------+ ECA       91      10                                              +----------+--------+--------+--------+-----------------------+--------+ +----------+--------+--------+--------+-------------------+           PSV cm/sEDV cm/sDescribeArm Pressure (mmHG) +----------+--------+--------+--------+-------------------+ [id number]                                         +----------+--------+--------+--------+-------------------+ +---------+--------+--+--------+--+---------+  VertebralPSV cm/s76EDV cm/s26Antegrade +---------+--------+--+--------+--+---------+   Summary: Right Carotid: Velocities in the right ICA are consistent with a 1-39% stenosis. Left Carotid: Velocities in the left ICA are consistent with a 1-39% stenosis. Vertebrals: Bilateral  vertebral arteries demonstrate antegrade flow. *See table(s) above for measurements and observations.  Electronically signed by Sherald Hess MD on 12/23/2023 at 1:03:22 PM.    Final    MR BRAIN WO CONTRAST Result Date: 12/22/2023 CLINICAL DATA:  Mental status change, unknown cause. Headache and weakness. EXAM: MRI HEAD WITHOUT CONTRAST TECHNIQUE: Multiplanar, multiecho pulse sequences of the brain and surrounding structures were obtained without intravenous contrast. COMPARISON:  Head CT 12/21/2023 and MRI 04/30/2022 FINDINGS: Brain: There are punctate cortical and subcortical acute to early subacute infarcts involving the left greater than right cerebral hemispheres (bilateral occipital, bilateral frontal, and left parietal lobes). Small acute infarcts are also present in the right caudate body and right cerebellar hemisphere. No intracranial hemorrhage, mass, midline shift, or extra-axial fluid collection is identified. Patchy T2 hyperintensities scattered throughout the cerebral white matter bilaterally are similar to the prior MRI and are nonspecific but compatible with moderate chronic small vessel ischemic disease. Cerebral volume is normal. The ventricles are normal in size. Symmetric T1 hyperintensity in the globi pallidi is likely related to the history of liver disease. Vascular: Major intracranial vascular flow voids are preserved. Skull and upper cervical spine: Unremarkable bone marrow signal. Sinuses/Orbits: Bilateral cataract extraction. Paranasal sinuses and mastoid air cells are clear. Other: None. IMPRESSION: 1. Multiple small acute/early subacute infarcts in the left greater than right cerebral hemispheres and right cerebellum. 2. Moderate chronic small vessel ischemic disease. Electronically Signed   By: Sebastian Ache M.D.   On: 12/22/2023 19:38   VAS [place] LOWER EXTREMITY VENOUS (DVT) Result Date: 12/22/2023  Lower Venous DVT Study Patient Name:  EMMALIE HAIGH  Date of Exam:   12/22/2023  Medical Rec #: [id number]        Accession #:    [id number] Date of Birth: December 16, 1950         Patient Gender: F Patient Age:   68 years Exam Location:  [facility] Procedure:      VAS [place] LOWER EXTREMITY VENOUS (DVT) Referring Phys: Hazeline Junker --------------------------------------------------------------------------------  Indications: Swelling.  Risk Factors: None identified. Limitations: Poor ultrasound/tissue interface and patient pain tolerance. Comparison Study: No prior studies. Performing Technologist: Chanda Busing RVT  Examination Guidelines: A complete evaluation includes B-mode imaging, spectral Doppler, color Doppler, and power Doppler as needed of all accessible portions of each vessel. Bilateral testing is considered an integral part of a complete examination. Limited examinations for reoccurring indications may be performed as noted. The reflux portion of the exam is performed with the patient in reverse Trendelenburg.  +---------+---------------+---------+-----------+----------+--------------+ RIGHT    CompressibilityPhasicitySpontaneityPropertiesThrombus Aging +---------+---------------+---------+-----------+----------+--------------+ CFV      Full           Yes      No                                  +---------+---------------+---------+-----------+----------+--------------+ SFJ      Full                                                        +---------+---------------+---------+-----------+----------+--------------+  FV Prox  Full                                                        +---------+---------------+---------+-----------+----------+--------------+ FV Mid   Full                                                        +---------+---------------+---------+-----------+----------+--------------+ FV Distal               Yes      No                                  +---------+---------------+---------+-----------+----------+--------------+ PFV       Full                                                        +---------+---------------+---------+-----------+----------+--------------+ POP      Full           Yes      No                                  +---------+---------------+---------+-----------+----------+--------------+ PTV      Full                                                        +---------+---------------+---------+-----------+----------+--------------+ PERO     Full                                                        +---------+---------------+---------+-----------+----------+--------------+   +---------+---------------+---------+-----------+----------+--------------+ LEFT     CompressibilityPhasicitySpontaneityPropertiesThrombus Aging +---------+---------------+---------+-----------+----------+--------------+ CFV      Full           Yes      No                                  +---------+---------------+---------+-----------+----------+--------------+ SFJ      Full                                                        +---------+---------------+---------+-----------+----------+--------------+ FV Prox  Full                                                        +---------+---------------+---------+-----------+----------+--------------+  FV Mid   Full                                                        +---------+---------------+---------+-----------+----------+--------------+ FV Distal               Yes      No                                  +---------+---------------+---------+-----------+----------+--------------+ PFV      Full                                                        +---------+---------------+---------+-----------+----------+--------------+ POP      Full           Yes      No                                  +---------+---------------+---------+-----------+----------+--------------+ PTV      Full                                                         +---------+---------------+---------+-----------+----------+--------------+ PERO     Full                                                        +---------+---------------+---------+-----------+----------+--------------+     Summary: RIGHT: - There is no evidence of deep vein thrombosis in the lower extremity. However, portions of this examination were limited- see technologist comments above.  - No cystic structure found in the popliteal fossa.  LEFT: - There is no evidence of deep vein thrombosis in the lower extremity. However, portions of this examination were limited- see technologist comments above.  - No cystic structure found in the popliteal fossa.  *See table(s) above for measurements and observations. Electronically signed by Heath Lark on 12/22/2023 at 4:14:30 PM.    Final    [place] ABD LTD RUG W/LIVER DOPPLER Result Date: 12/22/2023 CLINICAL DATA:  73 year old female with liver decompensation. History of prior tips EXAM: DUPLEX ULTRASOUND OF LIVER TECHNIQUE: Color and duplex Doppler ultrasound was performed to evaluate the hepatic in-flow and out-flow vessels. COMPARISON:  None Available. FINDINGS: Portal Vein Velocities Main:  16 cm/sec Right:  53 cm/sec Left:  12 cm/sec Transjugular intrahepatic portosystemic shunt Velocities: Proximal: 46 centimeter/second Mid: 53 centimeter/second Distal: 49 centimeter/second Parvus tardus waveform Hepatic Vein Velocities Right:  20 cm/sec Middle:  19 cm/sec Left:  23 cm/sec Hepatic Artery Velocity:  133 cm/sec Splenic Vein Velocity:  21 cm/sec Varices: Documented positive Ascites: Negative Cholelithiasis without gallbladder wall thickening. Liver: Heterogeneous echogenicity.  No focal lesion. Gallbladder: Negative sonographic Murphy's sign. Echogenic foci within the gallbladder lumen  with shadowing with the largest measuring 1.2 cm. No gallbladder wall thickening. Common bile duct: 2.4 mm IMPRESSION: TIPS is patent, however, abnormal  waveforms and velocity suggesting developing in stent stenosis. Cholelithiasis without sonographic evidence of acute cholecystitis. The ultrasound documents varices of the upper abdomen, and negative for ascites. Signed, Yvone Neu. Miachel Roux, RPVI [facility] Electronically Signed   By: Gilmer Mor D.O.   On: 12/22/2023 15:01   CT ABDOMEN PELVIS WO CONTRAST Result Date: 12/21/2023 CLINICAL DATA:  abdominal pain, nausea, fevers EXAM: CT ABDOMEN AND PELVIS WITHOUT CONTRAST TECHNIQUE: Multidetector CT imaging of the abdomen and pelvis was performed following the standard protocol without IV contrast. RADIATION DOSE REDUCTION: This exam was performed according to the departmental dose-optimization program which includes automated exposure control, adjustment of the mA and/or kV according to patient size and/or use of iterative reconstruction technique. COMPARISON: 04/25/2023 and previous FINDINGS: Lower chest: No pleural or pericardial effusion Hepatobiliary: . TIPS stent stable position. No focal liver lesion or biliary ductal dilatation. Pancreas: Unremarkable. No pancreatic ductal dilatation or surrounding inflammatory changes. Spleen: Normal in size without focal abnormality. Adrenals/Urinary Tract: Adrenal glands are unremarkable. Kidneys are normal, without renal calculi, focal lesion, or hydronephrosis. Bladder is unremarkable. Stomach/Bowel: Stomach nondistended, unremarkable. Small bowel decompressed. Post right hemicolectomy. Anastomotic staple line right mid abdomen. Remaining colon is incompletely distended, unremarkable. Vascular/Lymphatic: Mild scattered aortoiliac calcified plaque. No abdominal or pelvic adenopathy. Reproductive: Uterus and bilateral adnexa are unremarkable. Other: No abdominal ascites. No free air. Trace free fluid in the pelvis. Musculoskeletal: New compression fracture deformity of L1 with approximately 50% loss of  height anteriorly, with diffuse sclerosis, no significant retropulsion. Stable compression fracture deformities of T10, T12, L2, and L3. IMPRESSION: 1. No acute findings. 2. New L1 compression fracture deformity. 3.  Aortic Atherosclerosis (ICD10-I70.0). Electronically Signed   By: Corlis Leak M.D.   On: 12/21/2023 15:48   CT Head Wo Contrast Result Date: 12/21/2023 CLINICAL DATA:  Headache, increased frequency or severity. Nausea and vomiting. EXAM: CT HEAD WITHOUT CONTRAST TECHNIQUE: Contiguous axial images were obtained from the base of the skull through the vertex without intravenous contrast. RADIATION DOSE REDUCTION: This exam was performed according to the departmental dose-optimization program which includes automated exposure control, adjustment of the mA and/or kV according to patient size and/or use of iterative reconstruction technique. COMPARISON:  02/09/2023 FINDINGS: Brain: The brain shows a normal appearance without evidence of malformation, atrophy, old or acute small or large vessel infarction, mass lesion, hemorrhage, hydrocephalus or extra-axial collection. Vascular: No significant finding. Skull: Normal.  No traumatic finding.  No focal bone lesion. Sinuses/Orbits: Sinuses are clear. Orbits appear normal. Mastoids are clear. Other: None significant IMPRESSION: Normal head CT. Electronically Signed   By: Paulina Fusi M.D.   On: 12/21/2023 15:34   DG Chest 2 View Result Date: 12/21/2023 CLINICAL DATA:  Cough. EXAM: CHEST - 2 VIEW COMPARISON:  Chest radiograph dated 04/21/2023. FINDINGS: No focal consolidation or pneumothorax. Probable trace left pleural effusion. Stable cardiac silhouette. No acute osseous pathology. A TIPS noted over the liver. IMPRESSION: No focal consolidation.  Trace left pleural effusion. Electronically Signed   By: Elgie Collard M.D.   On: 12/21/2023 14:55   DG Abd 1 View Result Date: 12/10/2023 CLINICAL DATA:  Constipation.  Decompensated liver failure EXAM:  ABDOMEN - 1 VIEW COMPARISON:  None Available. FINDINGS: Gas-filled loops of small and large bowel without dilatation. There is moderate volume stool in the  descending colon. Moderate volume stool in the rectosigmoid colon. No pathologic calcifications. No organomegaly. Tips stent noted. IMPRESSION: Moderate volume stool in the rectosigmoid colon could represent constipation. No evidence of bowel obstruction. Electronically Signed   By: Genevive Bi M.D.   On: 12/10/2023 16:58    Microbiology: Results for orders placed or performed during the hospital encounter of 12/21/23  Resp panel by RT-PCR (RSV, Flu A&B, Covid) Anterior Nasal Swab     Status: None   Collection Time: 12/21/23  8:55 AM   Specimen: Anterior Nasal Swab  Result Value Ref Range Status   SARS Coronavirus 2 by RT PCR NEGATIVE NEGATIVE Final    Comment: (NOTE) SARS-CoV-2 target nucleic acids are NOT DETECTED.  The SARS-CoV-2 RNA is generally detectable in upper respiratory specimens during the acute phase of infection. The lowest concentration of SARS-CoV-2 viral copies this assay can detect is 138 copies/mL. A negative result does not preclude SARS-Cov-2 infection and should not be used as the sole basis for treatment or other patient management decisions. A negative result may occur with  improper specimen collection/handling, submission of specimen other than nasopharyngeal swab, presence of viral mutation(s) within the areas targeted by this assay, and inadequate number of viral copies(<138 copies/mL). A negative result must be combined with clinical observations, patient history, and epidemiological information. The expected result is Negative.  Fact Sheet for Patients:  BloggerCourse.com  Fact Sheet for Healthcare Providers:  SeriousBroker.it  This test is no t yet approved or cleared by the [place] FDA and  has been authorized for detection and/or diagnosis  of SARS-CoV-2 by FDA under an Emergency Use Authorization (EUA). This EUA will remain  in effect (meaning this test can be used) for the duration of the COVID-19 declaration under Section 564(b)(1) of the Act, 21 U.S.C.section 360bbb-3(b)(1), unless the authorization is terminated  or revoked sooner.       Influenza A by PCR NEGATIVE NEGATIVE Final   Influenza B by PCR NEGATIVE NEGATIVE Final    Comment: (NOTE) The Xpert Xpress SARS-CoV-2/FLU/RSV plus assay is intended as an aid in the diagnosis of influenza from Nasopharyngeal swab specimens and should not be used as a sole basis for treatment. Nasal washings and aspirates are unacceptable for Xpert Xpress SARS-CoV-2/FLU/RSV testing.  Fact Sheet for Patients: BloggerCourse.com  Fact Sheet for Healthcare Providers: SeriousBroker.it  This test is not yet approved or cleared by the [place] FDA and has been authorized for detection and/or diagnosis of SARS-CoV-2 by FDA under an Emergency Use Authorization (EUA). This EUA will remain in effect (meaning this test can be used) for the duration of the COVID-19 declaration under Section 564(b)(1) of the Act, 21 U.S.C. section 360bbb-3(b)(1), unless the authorization is terminated or revoked.     Resp Syncytial Virus by PCR NEGATIVE NEGATIVE Final    Comment: (NOTE) Fact Sheet for Patients: BloggerCourse.com  Fact Sheet for Healthcare Providers: SeriousBroker.it  This test is not yet approved or cleared by the [place] FDA and has been authorized for detection and/or diagnosis of SARS-CoV-2 by FDA under an Emergency Use Authorization (EUA). This EUA will remain in effect (meaning this test can be used) for the duration of the COVID-19 declaration under Section 564(b)(1) of the Act, 21 U.S.C. section 360bbb-3(b)(1), unless the authorization is terminated  or revoked.  Performed at [facility], [address], [place], [place] [address]     Labs: CBC: Recent Labs  Lab 12/21/23 [phone] 12/22/23 0500 12/23/23 0546 12/24/23  0524  WBC 13.6* 6.4 5.3 5.3  NEUTROABS  --   --  3.5  --   HGB 13.0 11.2* 11.3* 11.4*  HCT 38.4 33.0* 34.4* 34.3*  MCV 100.0 100.3* 103.3* 102.4*  PLT 128* 104* 101* 114*   Basic Metabolic Panel: Recent Labs  Lab 12/21/23 0855 12/22/23 0500 12/23/23 0546 12/24/23 0524  NA 130* 136 139 134*  K 4.1 3.8 3.9 3.7  CL 95* 102 112* 109  CO2 23 23 19* 19*  GLUCOSE 103* 97 93 83  BUN 22 21 18 16   CREATININE 0.99 0.65 0.77 0.63  CALCIUM 9.0 8.6* 8.6* 8.7*   Liver Function Tests: Recent Labs  Lab 12/21/23 0855 12/22/23 0500 12/23/23 0546 12/24/23 0524  AST 72* 54* 43* 41  ALT 55* 43 37 37  ALKPHOS 213* 159* 147* 162*  BILITOT 3.1* 1.8* 1.4* 1.5*  PROT 7.2 5.9* 5.7* 5.9*  ALBUMIN 3.0* 2.4* 2.3* 2.4*   CBG: No results for input(s): "GLUCAP" in the last 168 hours.  Discharge time spent: greater than 30 minutes.  Signed: Tyrone Nine, MD [facility] 12/25/2023

## 2023-12-25 NOTE — Progress Notes (Signed)
Physical Therapy Treatment Patient Details Name: [name] MRN: [id number] DOB: 1951/02/26 Today's Date: 12/25/2023   History of Present Illness 73 y.o. female who was brought from home by her husband to the ED on 12/21/2023 with worsening encephalopathy. Dx of Hepatic encephalopathy, MRI of brain showed  Multiple small acute/early subacute infarcts in the left greater  than right cerebral hemispheres and right cerebellum.  Imaging also showed L1 compression fracture.  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    PT Comments  Able to increase ambulation distance today with HR in 80s.  Husband concerned about some hallucinations she had earlier this AM and deferred to MD.    If plan is discharge home, recommend the following: A little help with walking and/or transfers;A little help with bathing/dressing/bathroom;Assistance with cooking/housework;Assist for transportation;Help with stairs or ramp for entrance   Can travel by private vehicle        Equipment Recommendations  None recommended by PT    Recommendations for Other Services       Precautions / Restrictions Precautions Precautions: Fall Precaution Comments: denies falls in past 6 months Restrictions Weight Bearing Restrictions Per Provider Order: No     Mobility  Bed Mobility               General bed mobility comments:  (up in recliner upon arrival)    Transfers Overall transfer level: Needs assistance Equipment used: Rolling walker (2 wheels) Transfers: Sit to/from Stand Sit to Stand: Contact guard assist           General transfer comment: cues for hand placement as she wants to pull up on RW    Ambulation/Gait Ambulation/Gait assistance: Contact guard assist   Assistive device: Rolling walker (2 wheels) Gait Pattern/deviations:  Step-through pattern, Decreased stride length Gait velocity: decr     General Gait Details: followed directions to turn left out of room.  Then on way back was given her room number, but did not identify it. HR 80s with gait with RW.   Stairs             Wheelchair Mobility     Tilt Bed    Modified Rankin (Stroke Patients Only)       Balance                                            Cognition Arousal: Alert Behavior During Therapy: Flat affect Overall Cognitive Status: Impaired/Different from baseline Area of Impairment:  (hallucinations earlier this AM per husband)                                        Exercises      General Comments        Pertinent Vitals/Pain Pain Assessment Pain Assessment: No/denies pain    Home Living                          Prior Function            PT Goals (current goals can now be found in the care plan section) Acute Rehab PT Goals Patient Stated Goal:  likes to read and watch her grandchildren play sports Potential to Achieve Goals: Good    Frequency    Min 1X/week      PT Plan      Co-evaluation              AM-PAC PT "6 Clicks" Mobility   Outcome Measure  Help needed turning from your back to your side while in a flat bed without using bedrails?: None Help needed moving from lying on your back to sitting on the side of a flat bed without using bedrails?: A Little Help needed moving to and from a bed to a chair (including a wheelchair)?: A Little Help needed standing up from a chair using your arms (e.g., wheelchair or bedside chair)?: A Little Help needed to walk in hospital room?: A Little Help needed climbing 3-5 steps with a railing? : A Little 6 Click Score: 19    End of Session Equipment Utilized During Treatment: Gait belt Activity Tolerance: Patient tolerated treatment well Patient left: in chair;with family/visitor present   PT Visit  Diagnosis: Other abnormalities of gait and mobility (R26.89)     Time: 1009-1030 PT Time Calculation (min) (ACUTE ONLY): 21 min  Charges:    $Gait Training: 8-22 mins PT General Charges $$ ACUTE PT VISIT: 1 Visit                     [name]., PT Office [phone] Acute Rehab 12/25/2023    [name] 12/25/2023, 10:42 AM

## 2023-12-25 NOTE — Plan of Care (Signed)

## 2023-12-27 ENCOUNTER — Telehealth: Payer: Self-pay

## 2023-12-27 NOTE — Transitions of Care (Post Inpatient/ED Visit) (Signed)
12/27/2023  Name: [name] MRN: [id number] DOB: October 05, 1951  Today's TOC FU Call Status: Today's TOC FU Call Status:: Successful TOC FU Call Completed TOC FU Call Complete Date: 12/27/23 Patient's Name and Date of Birth confirmed.  Transition Care Management Follow-up Telephone Call Date of Discharge: 12/25/23 Discharge Facility: [facility]) Type of Discharge: Inpatient Admission Primary Inpatient Discharge Diagnosis:: Hepatic encephalopathy How have you been since you were released from the hospital?: Better Any questions or concerns?: No  Items Reviewed: Did you receive and understand the discharge instructions provided?: Yes Medications obtained,verified, and reconciled?: Yes (Medications Reviewed) Any new allergies since your discharge?: No Dietary orders reviewed?: NA Do you have support at home?: Yes Name of Support/Comfort Primary Source: Gillie Manners, spouse  Medications Reviewed Today: Medications Reviewed Today     Reviewed by Redge Gainer, RN (Case Manager) on 12/27/23 at 1508  Med List Status: <None>   Medication Order Taking? Sig Documenting Provider Last Dose Status Informant  acidophilus (RISAQUAD) CAPS capsule [id number] No Take 1 capsule by mouth daily. [provider] 12/20/2023 Active Spouse/Significant Other  aspirin EC 81 MG tablet [id number]  Take 1 tablet (81 mg total) by mouth daily. Swallow whole. Tyrone Nine, MD  Active   CRANBERRY PO [id number] No Take 1 tablet by mouth 2 (two) times daily. [provider] 12/20/2023 Active Spouse/Significant Other  estradiol (ESTRACE) 0.1 MG/GM vaginal cream [id number] No Place 1 Applicatorful vaginally every other day. [provider] 12/20/2023 Active Spouse/Significant Other  furosemide (LASIX) 20 MG tablet [id number] No Take 20-40 mg by mouth See admin instructions. Take blood pressure and depending on reading take 20mg  or 40mg  by mouth daily. [provider] 12/20/2023 Morning Active Spouse/Significant Other  lactulose (CHRONULAC) 10 GM/15ML solution [id number] No Take 45 mLs (30 g total) by mouth 3 (three) times daily. Joycelyn Das, MD 12/20/2023 Noon Active Spouse/Significant Other  levOCARNitine (CARNITOR) 330 MG tablet [id number] No Take 330 mg by mouth 3 (three) times daily. [provider] 12/20/2023 Noon Active Spouse/Significant Other  liver oil-zinc oxide (DESITIN) 40 % ointment [id number] No Apply topically daily as needed for irritation. Joycelyn Das, MD 12/20/2023 Active Spouse/Significant Other  Multiple Vitamin (MULTI-VITAMIN) tablet [id number] No Take 1 tablet by mouth daily. [provider] 12/20/2023 Active Spouse/Significant Other  polyethylene glycol (MIRALAX / GLYCOLAX) 17 g packet [id number] No Take 17 g by mouth daily as needed for mild constipation. Pokhrel, Laxman, MD Taking Active Spouse/Significant Other  rifaximin (XIFAXAN) 550 MG TABS tablet [id number]  Take 1 tablet (550 mg total) by mouth 2 (two) times daily. Tyrone Nine, MD  Active   spironolactone (ALDACTONE) 25 MG tablet [id number] No Take 50 mg by mouth daily. [provider] 12/20/2023 Active Spouse/Significant Other  SYNTHROID 75 MCG tablet [id number] No TAKE 1 TABLET BY MOUTH DAILY BEFORE BREAKFAST. MUST BE SEEN IN OFFICE FOR MORE REFILLS Eden Emms, NP 12/20/2023 Active Spouse/Significant Other            Home Care and Equipment/Supplies: Were Home Health Services Ordered?: No Any new equipment or medical supplies ordered?: No  Functional Questionnaire: Do you need assistance with bathing/showering or dressing?: No Do you need assistance with meal preparation?: No Do you need assistance with eating?: No Do you have difficulty maintaining continence: No Do you need assistance with getting out of bed/getting out of a chair/moving?: No Do you have difficulty managing or taking your medications?: No  Follow up  appointments reviewed: PCP  Follow-up appointment confirmed?: No MD Provider Line Number:[phone] Given: No (Declines at this time) Specialist Hospital Follow-up appointment confirmed?: No Reason Specialist Follow-Up Not Confirmed: Patient has Specialist Provider Number and will Call for Appointment Do you need transportation to your follow-up appointment?: No Do you understand care options if your condition(s) worsen?: Yes-patient verbalized understanding  SDOH Interventions Today    Flowsheet Row Most Recent Value  SDOH Interventions   Food Insecurity Interventions Intervention Not Indicated  Housing Interventions Intervention Not Indicated  Transportation Interventions Intervention Not Indicated  Utilities Interventions Intervention Not Indicated  Social Connections Interventions Intervention Not Indicated      Interventions Today    Flowsheet Row Most Recent Value  Chronic Disease   Chronic disease during today's visit Other  [Liver Failure]  General Interventions   General Interventions Discussed/Reviewed General Interventions Discussed  Exercise Interventions   Exercise Discussed/Reviewed Physical Activity  Physical Activity Discussed/Reviewed Physical Activity Discussed  Education Interventions   Education Provided Provided Education  Provided Verbal Education On When to see the doctor, Sick Day Rules  Nutrition Interventions   Nutrition Discussed/Reviewed Nutrition Discussed  Pharmacy Interventions   Pharmacy Dicussed/Reviewed Medications and their functions  Safety Interventions   Safety Discussed/Reviewed Fall Risk       The patient has been provided with contact information for the care management team and has been advised to call with any health-related questions or concerns. The patient verbalized understanding with current POC. The patient is directed to their insurance card regarding availability of benefits coverage.   Deidre Ala, BSN, RN [facility]  VBCI - [employer] [facility] RN Care Manager [phone]

## 2024-01-03 DIAGNOSIS — I639 Cerebral infarction, unspecified: Secondary | ICD-10-CM

## 2024-01-06 ENCOUNTER — Ambulatory Visit: Payer: Medicare Other | Admitting: Nurse Practitioner

## 2024-01-06 VITALS — BP 108/60 | HR 65 | Temp 97.6°F | Ht 62.25 in | Wt 143.2 lb

## 2024-01-06 DIAGNOSIS — D696 Thrombocytopenia, unspecified: Secondary | ICD-10-CM | POA: Diagnosis not present

## 2024-01-06 DIAGNOSIS — E039 Hypothyroidism, unspecified: Secondary | ICD-10-CM

## 2024-01-06 DIAGNOSIS — Z09 Encounter for follow-up examination after completed treatment for conditions other than malignant neoplasm: Secondary | ICD-10-CM

## 2024-01-06 DIAGNOSIS — Z8673 Personal history of transient ischemic attack (TIA), and cerebral infarction without residual deficits: Secondary | ICD-10-CM

## 2024-01-06 LAB — COMPREHENSIVE METABOLIC PANEL
ALT: 18 U/L (ref 0–35)
AST: 29 U/L (ref 0–37)
Albumin: 2.9 g/dL — ABNORMAL LOW (ref 3.5–5.2)
Alkaline Phosphatase: 146 U/L — ABNORMAL HIGH (ref 39–117)
BUN: 12 mg/dL (ref 6–23)
CO2: 31 meq/L (ref 19–32)
Calcium: 9.2 mg/dL (ref 8.4–10.5)
Chloride: 100 meq/L (ref 96–112)
Creatinine, Ser: 0.93 mg/dL (ref 0.40–1.20)
GFR: 61.36 mL/min (ref >60.00)
Glucose, Bld: 109 mg/dL — ABNORMAL HIGH (ref 70–99)
Potassium: 4.3 meq/L (ref 3.5–5.1)
Sodium: 137 meq/L (ref 135–145)
Total Bilirubin: 1.1 mg/dL (ref 0.2–1.2)
Total Protein: 7.4 g/dL (ref 6.0–8.3)

## 2024-01-06 LAB — CBC
HCT: 32.5 % — ABNORMAL LOW (ref 36.0–46.0)
Hemoglobin: 11.2 g/dL — ABNORMAL LOW (ref 12.0–15.0)
MCHC: 34.5 g/dL (ref 30.0–36.0)
MCV: 102.6 fL — ABNORMAL HIGH (ref 78.0–100.0)
Platelets: 407 10*3/uL — ABNORMAL HIGH (ref 150.0–400.0)
RBC: 3.17 Mil/uL — ABNORMAL LOW (ref 3.87–5.11)
RDW: 15.4 % (ref 11.5–15.5)
WBC: 5.7 10*3/uL (ref 4.0–10.5)

## 2024-01-06 MED ORDER — SYNTHROID 50 MCG PO TABS: 50.0000 ug | ORAL_TABLET | Freq: Every day | ORAL | 1 refills | Status: DC

## 2024-01-06 NOTE — Progress Notes (Signed)
Acute Office Visit  Subjective:     Patient ID: [name], female    DOB: [birth year], 73 y.o.   MRN: [id number]  Chief Complaint  Patient presents with   Hepatic encephalopathy     Hospital Follow up.  Pt complains of feeling alright.     HPI Patient is in today for Hospital follow up   Patient was admitted to the hosptial on 12/21/2023 after a virtual visit.  Patient went to the emergency department complaining abdominal pain and lethargy.  They did draw basic blood work which did show some hyponatremia, leukocytosis and slightly elevated ammonia level.  Patient was given lactulose along with some fluids.  She was admitted for hepatic encephalopathy.  Patient was discharged on 12/25/2023.  Patient was prescribed rifaximin to use as needed along with lactulose.  She was referred to neurology.  Instructed to follow-up with cardiology and endocrinology they also want the patient to follow-up with IR for a TIPS revision.  Did show that she had multiple subacute infarcts.  Ultrasound abdomen showed narrowing of the TIPS which is the reason for IR referral for revision   States that she is still having a small headache. States that she did have cataract surgery and she has been noticing that she is squinting  and she has tried her old reading glasses with some relief. States that they plan on going to get new reading glasses to see if this helps abate the headache. Review of Systems  Constitutional:  Negative for chills and fever.  Respiratory:  Negative for shortness of breath.   Cardiovascular:  Negative for chest pain.  Gastrointestinal:  Negative for abdominal pain, constipation, diarrhea, nausea and vomiting.  Neurological:  Negative for dizziness and headaches.  Psychiatric/Behavioral:  Negative for hallucinations and suicidal ideas.         Objective:    BP 108/60   Pulse 65   Temp 97.6 F (36.4 C) (Oral)   Ht 5' 2.25" (1.581 m)   Wt 143 lb 3.2 oz (65 kg)   SpO2 98%    BMI 25.98 kg/m    Physical Exam Vitals and nursing note reviewed.  Constitutional:      Appearance: Normal appearance.  HENT:     Mouth/Throat:     Mouth: Mucous membranes are moist.     Pharynx: Oropharynx is clear.  Eyes:     Extraocular Movements: Extraocular movements intact.     Pupils: Pupils are equal, round, and reactive to light.  Cardiovascular:     Rate and Rhythm: Normal rate and regular rhythm.     Heart sounds: Normal heart sounds.  Pulmonary:     Effort: Pulmonary effort is normal.     Breath sounds: Normal breath sounds.  Neurological:     General: No focal deficit present.     Mental Status: She is alert.     Comments: Bilateral upper and lower extremity strength 5/5     No results found for any visits on 01/06/24.      Assessment & Plan:   Problem List Items Addressed This Visit       Endocrine   Hypothyroidism - Primary   History of same patient was maintained on Synthroid 75 mcg daily.  She has had 2 thyroid functions decreasing in value we will switch to 50 mcg daily.  Patient needs a lab visit in 6 weeks      Relevant Medications   SYNTHROID 50 MCG tablet  Hematopoietic and Hemostatic   Thrombocytopenia (HCC)   History of same.  Patient does have liver failure patient was recently put on ASA 81 mg daily for secondary stroke prevention check platelet level today      Relevant Orders   CBC   Comprehensive metabolic panel     Other   Hospital discharge follow-up   Did review recent blood work, ED admission note, inpatient discharge note along with imaging.      History of stroke   Patient is being followed by neurology and cardiology.  No acute deficits poststroke findings on MRI.      Relevant Orders   CBC   Comprehensive metabolic panel    Meds ordered this encounter  Medications   SYNTHROID 50 MCG tablet    Sig: Take 1 tablet (50 mcg total) by mouth daily before breakfast.    Dispense:  90 tablet    Refill:  1     Supervising Provider:   Roxy Manns A [[id number]]    Return in about 3 months (around 04/04/2024) for Liver/CVA recheck .  Audria Nine, NP

## 2024-01-06 NOTE — Assessment & Plan Note (Signed)
Patient is being followed by neurology and cardiology.  No acute deficits poststroke findings on MRI.

## 2024-01-06 NOTE — Assessment & Plan Note (Signed)
History of same patient was maintained on Synthroid 75 mcg daily.  She has had 2 thyroid functions decreasing in value we will switch to 50 mcg daily.  Patient needs a lab visit in 6 weeks

## 2024-01-06 NOTE — Assessment & Plan Note (Signed)
History of same.  Patient does have liver failure patient was recently put on ASA 81 mg daily for secondary stroke prevention check platelet level today

## 2024-01-06 NOTE — Patient Instructions (Signed)
Nice to see you today You will make a lab only appointment in 6 weeks Make an office visit with me in 3 months.

## 2024-01-06 NOTE — Assessment & Plan Note (Signed)
Did review recent blood work, ED admission note, inpatient discharge note along with imaging.

## 2024-01-11 ENCOUNTER — Encounter: Payer: Self-pay | Admitting: Nurse Practitioner

## 2024-01-19 ENCOUNTER — Ambulatory Visit: Payer: Medicare Other | Admitting: "Endocrinology

## 2024-01-30 NOTE — Progress Notes (Unsigned)
 Cardiology Office Note    Patient Name: [name] Date of Encounter: 01/30/2024  Primary Care Provider:  Eden Emms, NP Primary Cardiologist:  None Primary Electrophysiologist: None   Past Medical History    Past Medical History:  Diagnosis Date   Acute urinary retention 05/04/2022   Allergy 2006 ?   Contrast dye   Arthritis 2016 ??   Knees and thumb   Cancer [facility])    cecum   Cataract 2021   Surgery scheduled July 2023   Colon cancer [facility]) 2003   Elevated liver function tests    Esophageal varices (HCC)    Heart murmur On file   Hemorrhage of gastrointestinal tract 05/04/2011   Hypertension 2021   Hypothyroidism    Iron deficiency anemia    Liver disease    chemotherapy complication, per pt, shunts placed to bypass liver   Malignant neoplasm of cecum (HCC)    Portal hypertension (HCC)    Skin cancer 2019   Splenomegaly     History of Present Illness  [name] is a 73 year old female with a PMH of colon cancer s/p colectomy with chemotherapy in 2003, hypothyroidism, HTN, nonalcoholic cirrhosis with esophageal varices s/p TIPS in 2012, hepatic encephalopathy due to COVID-19 infection 07/2020 who presents today for posthospital follow-up for encephalopathy and subacute infarcts.  [name] presented to the ED on 12/21/2023 with complaint of worsening encephalopathy.  She underwent a brain MRI that showed early subacute infarcts affecting multiple territories and was consulted by neurology and started on aspirin with recommendation for 30-day event monitor following admission.  She was treated with lactulose and fluids as well as rifaximin which was prescribed with instruction to follow-up with GI.  She also underwent a carotid ultrasound that was negative and 2D echo was completed with EF of 60-65% with grade 2 DD and moderately elevated PASP with trivial MVR and positive bubble study.  She underwent abdominal ultrasound that showed narrowing of TIPS based on  velocities from previous procedure and will undergo evaluation by IR in the outpatient setting.  She was noted to have hypothermia which resolved. She had Lasix held at discharge due to soft blood pressures. She was seen in follow-up by PCP on 01/06/2024 and showed no acute deficits since discharge with blood pressures in the low 100s systolically.  [name] presents today with her husband for posthospital follow-up.  She reports feeling much better since her discharge and denies any new cardiac complaints or tachycardia.  She has been compliant with her current medications and denies any adverse reactions.  Her blood pressure was stable today at 110/60 and heart rate was 77 bpm.  She is euvolemic on examination and has been compliant with her diuretics.  She completed her 30-day event monitor and mailed the device and this morning.  We discussed the process of evaluating for embolic source for her infarction seen on MRI.  We further discussed the pathophysiology of atrial fibrillation and its role and possible embolic stroke.  She is also planning to follow-up with IR regarding revision to her TIPS procedure and will be following up with [facility] neurology in the next few weeks. Patient denies chest pain, palpitations, dyspnea, PND, orthopnea, nausea, vomiting, dizziness, syncope, edema, weight gain, or early satiety.   Review of Systems  Please see the history of present illness.    All other systems reviewed and are otherwise negative except as noted above.  Physical Exam    Wt Readings  from Last 3 Encounters:  01/06/24 143 lb 3.2 oz (65 kg)  12/10/23 153 lb (69.4 kg)  12/06/23 151 lb 2 oz (68.5 kg)   [id number] were no vitals filed for this visit.,There is no height or weight on file to calculate BMI. GEN: Well nourished, well developed in no acute distress Neck: No JVD; No carotid bruits Pulmonary: Clear to auscultation without rales, wheezing or rhonchi  Cardiovascular: Normal rate. Regular  rhythm. Normal S1. Normal S2.   Murmurs: There is no murmur.  ABDOMEN: Soft, non-tender, non-distended EXTREMITIES:  No edema; No deformity   EKG/LABS/ Recent Cardiac Studies   ECG personally reviewed by me today -none completed today  Risk Assessment/Calculations:     Lab Results  Component Value Date   WBC 5.7 01/06/2024   HGB 11.2 (L) 01/06/2024   HCT 32.5 (L) 01/06/2024   MCV 102.6 (H) 01/06/2024   PLT 407.0 (H) 01/06/2024   Lab Results  Component Value Date   CREATININE 0.93 01/06/2024   BUN 12 01/06/2024   NA 137 01/06/2024   K 4.3 01/06/2024   CL 100 01/06/2024   CO2 31 01/06/2024   Lab Results  Component Value Date   CHOL 121 12/23/2023   HDL 37 (L) 12/23/2023   LDLCALC 72 12/23/2023   TRIG 59 12/23/2023   CHOLHDL 3.3 12/23/2023    Lab Results  Component Value Date   HGBA1C 4.8 12/23/2023   Assessment & Plan    1.  History of CVA: -s/p subacute infarcts seen on MRI as well as positive bubble study on 2D echo. -Today patient is euvolemic on examination and reports compliance with ASA 81 mg. -30-day event monitor completed and pending results -We will plan to refer to structural heart if 30-day event monitor shows no evidence of AF.  2.Acute hepatic encephalopathy:  -Patient doing well with no residual effects or signs of encephalopathy. -Continue current medications with lactulose 10 mg per 15 mL and rifaximin mean 550 mg twice daily -Patient scheduled for follow-up with IR regarding possible TIPS revision  3.Liver cirrhosis with portal hypertension: -s/p TIPS in 2012 at [facility] with most recent abdominal ultrasound showing increased velocities with possible revision needed. -Continue lactulose as noted above  4.  Essential hypertension: -Patient's blood pressure today was stable at 110/60 -Continue spironolactone 25 mg twice daily  5.  History of SVT: -Patient reports no palpitations or tachycardia since previous follow-up -30-day event  monitor in place and pending results.  6.  Hypothyroidism: -Currently monitored by PCP -Continue spironolactone 25 mg daily  Disposition: Follow-up with None or APP in 3 months    Signed, Napoleon Form, Leodis Rains, NP 01/30/2024, 1:14 PM [facility]

## 2024-01-31 ENCOUNTER — Encounter: Payer: Self-pay | Admitting: Nurse Practitioner

## 2024-01-31 ENCOUNTER — Ambulatory Visit: Payer: Medicare Other | Attending: Nurse Practitioner | Admitting: Nurse Practitioner

## 2024-01-31 VITALS — BP 110/60 | HR 77 | Ht 64.0 in | Wt 145.6 lb

## 2024-01-31 DIAGNOSIS — K7682 Hepatic encephalopathy: Secondary | ICD-10-CM

## 2024-01-31 DIAGNOSIS — I10 Essential (primary) hypertension: Secondary | ICD-10-CM | POA: Diagnosis not present

## 2024-01-31 DIAGNOSIS — I639 Cerebral infarction, unspecified: Secondary | ICD-10-CM

## 2024-01-31 DIAGNOSIS — E039 Hypothyroidism, unspecified: Secondary | ICD-10-CM

## 2024-01-31 DIAGNOSIS — I471 Supraventricular tachycardia, unspecified: Secondary | ICD-10-CM

## 2024-01-31 NOTE — Patient Instructions (Signed)
 Medication Instructions:  Your physician recommends that you continue on your current medications as directed. Please refer to the Current Medication list given to you today. *If you need a refill on your cardiac medications before your next appointment, please call your pharmacy*   Lab Work: None Ordered If you have labs (blood work) drawn today and your tests are completely normal, you will receive your results only by: MyChart Message (if you have MyChart) OR A paper copy in the mail If you have any lab test that is abnormal or we need to change your treatment, we will call you to review the results.   Testing/Procedures: None ordered   Follow-Up: At [facility], you and your health needs are our priority.  As part of our continuing mission to provide you with exceptional heart care, we have created designated Provider Care Teams.  These Care Teams include your primary Cardiologist (physician) and Advanced Practice Providers (APPs -  Physician Assistants and Nurse Practitioners) who all work together to provide you with the care you need, when you need it.  We recommend signing up for the patient portal called "MyChart".  Sign up information is provided on this After Visit Summary.  MyChart is used to connect with patients for Virtual Visits (Telemedicine).  Patients are able to view lab/test results, encounter notes, upcoming appointments, etc.  Non-urgent messages can be sent to your provider as well.   To learn more about what you can do with MyChart, go to [email or web address].    Your next appointment:   3 month(s)  Provider:   Riley Lam, MD or Robin Searing, NP Other Instructions PLEASE TURN YOUR MONITOR IN TO ANY [employer] STORE

## 2024-02-02 ENCOUNTER — Ambulatory Visit: Attending: Internal Medicine

## 2024-02-02 DIAGNOSIS — I639 Cerebral infarction, unspecified: Secondary | ICD-10-CM

## 2024-02-06 ENCOUNTER — Encounter: Payer: Self-pay | Admitting: Internal Medicine

## 2024-03-06 ENCOUNTER — Ambulatory Visit: Admitting: "Endocrinology

## 2024-03-06 ENCOUNTER — Encounter: Payer: Self-pay | Admitting: "Endocrinology

## 2024-03-06 VITALS — BP 110/80 | HR 88 | Ht 64.0 in | Wt 148.0 lb

## 2024-03-06 DIAGNOSIS — E039 Hypothyroidism, unspecified: Secondary | ICD-10-CM | POA: Diagnosis not present

## 2024-03-06 DIAGNOSIS — E2740 Unspecified adrenocortical insufficiency: Secondary | ICD-10-CM | POA: Diagnosis not present

## 2024-03-06 MED ORDER — COSYNTROPIN 0.25 MG IJ SOLR
0.2500 mg | Freq: Once | INTRAMUSCULAR | Status: AC
  Administered 2024-03-06: 0.25 mg via INTRAVENOUS

## 2024-03-06 NOTE — Progress Notes (Signed)
 Outpatient Endocrinology Note Katherine [place], MD    [name] 1951/06/18 [id number]  Referring Provider: Eden Emms, NP Primary Care Provider: Eden Emms, NP Reason for consultation: Subjective   Assessment & Plan  Diagnoses and all orders for this visit:  Adrenal insufficiency (HCC) -     Other/Misc lab test -     cosyntropin (CORTROSYN) injection 0.25 mg  Acquired hypothyroidism -     TSH -     T4, free   Patient had a low evening cortisol around 3, followed by a stim test which was short of 0.2, with final result and cortisol of 17.8 starting from a baseline of 4.2.  In cases where cortisol does not rise above 18, we need to know the if the assay used to measure cortisol a more specific assay, for example, immunoassay using a more specific monoclonal antibody to cortisol?  If yes adrenal insufficiency is unlikely.  Since the assay type is unknown, we have to obtain age- and sex- specific DHEA-sulfate. If it is low, adrenal insufficiency is likely.  However in this case, given the cortisol level rose almost to 18, the adrenal insufficiency seems to be unlikely. DHEA-S WNL as well. Currently off of hydrocortisone and feel well 09/2023 Off of HC cortisol is 7.8 with normal DHEAS Ordered ACTH stimulation test, not done before 03/06/24: re-ordered ACTH stimulation test,  Been on levothyroxine since around 2020, not sure if on 25 mcg or 50 mcg po every day, takes vitamin close after  Recommend to take levothyroxine first thing in the morning on empty stomach and wait at least 30 minutes to 1 hour before eating or drinking anything or taking any other medications. Space out levothyroxine by 4 hours from any acid reflux medication, fibrate, iron, calcium, multivitamin, birth control pills and nutritional supplements.  C/o constipation and fatigue  Ordered labs today   Return in about 3 months (around 06/05/2024) for visit + labs before next visit, labs today.    I  have reviewed current medications, nurse's notes, allergies, vital signs, past medical and surgical history, family medical history, and social history for this encounter. Counseled patient on symptoms, examination findings, lab findings, imaging results, treatment decisions and monitoring and prognosis. The patient understood the recommendations and agrees with the treatment plan. All questions regarding treatment plan were fully answered.  Katherine [place], MD  03/06/24   History of Present Illness HPI  Pt is here for adrenal insufficiency work up, also wants advise on hypothyroidism  Weaned completely off of hydrocortisone On levothyroxine mcg, pt is not sure of the dose  C/o constipation  cooks some days Feel energetic in day, tried in  No abdominal pain/nausea/vomiting/diarrhea/weight loss Husband report occasional nausea   Been on levothyroxine since around 2020, not sure if on 25 mcg or 50 mcg po every day, takes vitamin close after   Initial work up:  Was admitted in hospital for hepatic encephalopathy/hyperammonemia in 02/2023 and was found to have low cortisol of 2.9 around 6 pm on 03/11/23.   She is taking hydrocortisone 5 mg tid since 02/2023  Currently taking hydrocortisone 10 mg qam and 5 mg at 3 pm   Patient feels well otherwise, has noted no chage   In 03/2023 admitted again for infection  After holding hydrocortisone 5 mg dose in evening Component     Latest Ref Rng 05/11/2023  Cortisol, Plasma     ug/dL 5.0      ACTH Stimulation  test  Component     Latest Ref Rng 03/12/2023  Cortisol, Base     ug/dL 4.2   Cortisol, 30 Min     ug/dL 16.1   Cortisol, 60 Min     ug/dL 09.6      Component Ref Range & Units 05/05/23 2 wk ago 2 mo ago 2 yr ago  Cortisol, Plasma ug/dL 04.5 40.9 CM 2.9 CM 9.7 CM  Comment: AM:  4.3 - 22.4 ug/dLPM:  3.1 - 16.7 ug/dL   While on HC 5 mg tid:  Component     Latest Ref Rng 05/05/2023  Sodium     135 - 145 mEq/L 139    Potassium     3.5 - 5.1 mEq/L 3.9   Chloride     96 - 112 mEq/L 102   CO2     19 - 32 mEq/L 28   Glucose     70 - 99 mg/dL 88   BUN     6 - 23 mg/dL 13   Creatinine     8.11 - 1.20 mg/dL 9.14   GFR     >78.29 mL/min 88.93   Calcium     8.4 - 10.5 mg/dL 9.1   DHEA-SO4     4 - 157 mcg/dL 4   ALDOSTERONE      ng/dL 2   Cortisol, Plasma     ug/dL 56.2     Physical Exam  BP 110/80   Pulse 88   Ht 5\' 4"  (1.626 m)   Wt 148 lb (67.1 kg)   SpO2 96%   BMI 25.40 kg/m    Constitutional: well developed, well nourished Head: normocephalic, atraumatic Eyes: sclera anicteric, no redness Neck: supple Lungs: normal respiratory effort Neurology: alert and oriented Skin: dry, no appreciable rashes Musculoskeletal: no appreciable defects Psychiatric: normal mood and affect   Current Medications Patient's Medications  New Prescriptions   No medications on file  Previous Medications   ACIDOPHILUS (RISAQUAD) CAPS CAPSULE    Take 1 capsule by mouth daily.   ASPIRIN EC 81 MG TABLET    Take 1 tablet (81 mg total) by mouth daily. Swallow whole.   CRANBERRY PO    Take 1 tablet by mouth 2 (two) times daily.   ESTRADIOL (ESTRACE) 0.1 MG/GM VAGINAL CREAM    Place 1 Applicatorful vaginally every other day.   FUROSEMIDE (LASIX) 20 MG TABLET    Take 20-40 mg by mouth See admin instructions. Take blood pressure and depending on reading take 20mg  or 40mg  by mouth daily.   LACTULOSE (CHRONULAC) 10 GM/15ML SOLUTION    Take 45 mLs (30 g total) by mouth 3 (three) times daily.   LEVOCARNITINE (CARNITOR) 330 MG TABLET    Take 330 mg by mouth 3 (three) times daily.   LEVOTHYROXINE (SYNTHROID) 25 MCG TABLET    Take 25 mcg by mouth daily before breakfast.   LIVER OIL-ZINC OXIDE (DESITIN) 40 % OINTMENT    Apply topically daily as needed for irritation.   MULTIPLE VITAMIN (MULTI-VITAMIN) TABLET    Take 1 tablet by mouth daily.   POLYETHYLENE GLYCOL (MIRALAX / GLYCOLAX) 17 G PACKET    Take 17 g by mouth  daily as needed for mild constipation.   RIFAXIMIN (XIFAXAN) 550 MG TABS TABLET    Take 1 tablet (550 mg total) by mouth 2 (two) times daily.   SPIRONOLACTONE (ALDACTONE) 25 MG TABLET    Take 50 mg by mouth daily.  Modified Medications  No medications on file  Discontinued Medications   No medications on file    Allergies Allergies  Allergen Reactions   Contrast Media [Iodinated Contrast Media] Hives    Past Medical History Past Medical History:  Diagnosis Date   Acute urinary retention 05/04/2022   Allergy 2006 ?   Contrast dye   Arthritis 2016 ??   Knees and thumb   Cancer (HCC)    cecum   Cataract 2021   Surgery scheduled July 2023   Colon cancer [facility]) 2003   Elevated liver function tests    Esophageal varices (HCC)    Heart murmur On file   Hemorrhage of gastrointestinal tract 05/04/2011   Hypertension 2021   Hypothyroidism    Iron deficiency anemia    Liver disease    chemotherapy complication, per pt, shunts placed to bypass liver   Malignant neoplasm of cecum (HCC)    Portal hypertension (HCC)    Skin cancer 2019   Splenomegaly     Past Surgical History Past Surgical History:  Procedure Laterality Date   COLON SURGERY  2004   Cancer   COSMETIC SURGERY  2021   Skin cancer   ESOPHAGEAL VARICE LIGATION     EYE SURGERY     HEMICOLECTOMY  01/08/2003   IR RADIOLOGIST EVAL & MGMT  12/20/2020   IR RADIOLOGIST EVAL & MGMT  05/29/2021   LIVER SURGERY     shunts placed after chemo complication   SKIN FULL THICKNESS GRAFT N/A 09/12/2019   Procedure: debridement and FTSG to the nose from left upper arm;  Surgeon: Barb Bonito, MD;  Location: [facility];  Service: Plastics;  Laterality: N/A;  2 hours, please   TIPS PROCEDURE      Family History family history includes Arthritis in her father and mother; Cancer in her maternal aunt; Diabetes in her father; Hearing loss in her mother; Heart disease in her father and mother; Hypertension in her  mother; Miscarriages / [place] in her mother.  Social History Social History   Socioeconomic History   Marital status: Married    Spouse name: Animal nutritionist   Number of children: 2   Years of education: Not on file   Highest education level: Some college, no degree  Occupational History   Occupation: Comptroller  Tobacco Use   Smoking status: Never   Smokeless tobacco: Never   Tobacco comments:    Never smoked  Vaping Use   Vaping status: Never Used  Substance and Sexual Activity   Alcohol use: Never   Drug use: Never   Sexual activity: Not Currently    Partners: Male  Other Topics Concern   Not on file  Social History Narrative   12/04/20   From: the area   Living: with husband, Dennard Fisher (1994)   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      Family: 2 children Designer, fashion/clothing and Optometrist - 2 grandchildren - nearby      Enjoys: read      Exercise: trying to get back to exercise   Diet: healthy, limits fast foods      Safety   Seat belts: Yes    Guns: Yes  and secure   Safe in relationships: Yes    Social Drivers of Corporate investment banker Strain: Low Risk  (12/09/2023)   Overall Financial Resource Strain (CARDIA)    Difficulty of Paying Living Expenses: Not very hard  Food Insecurity: No Food Insecurity (12/27/2023)   Hunger Vital  Sign    Worried About Programme researcher, broadcasting/film/video in the Last Year: Never true    Ran Out of Food in the Last Year: Never true  Transportation Needs: No Transportation Needs (12/27/2023)   PRAPARE - Administrator, Civil Service (Medical): No    Lack of Transportation (Non-Medical): No  Physical Activity: Insufficiently Active (12/09/2023)   Exercise Vital Sign    Days of Exercise per Week: 1 day    Minutes of Exercise per Session: 20 min  Stress: No Stress Concern Present (12/09/2023)   [employer] of Occupational Health - Occupational Stress Questionnaire    Feeling of Stress : Not at all  Social Connections: Moderately Isolated  (12/27/2023)   Social Connection and Isolation Panel [NHANES]    Frequency of Communication with Friends and Family: More than three times a week    Frequency of Social Gatherings with Friends and Family: More than three times a week    Attends Religious Services: Never    Active Member of Clubs or Organizations: No    Attends Banker Meetings: Never    Marital Status: Married  Catering manager Violence: Not At Risk (12/27/2023)   Humiliation, Afraid, Rape, and Kick questionnaire    Fear of Current or Ex-Partner: No    Emotionally Abused: No    Physically Abused: No    Sexually Abused: No    Lab Results  Component Value Date   CHOL 121 12/23/2023   Lab Results  Component Value Date   HDL 37 (L) 12/23/2023   Lab Results  Component Value Date   LDLCALC 72 12/23/2023   Lab Results  Component Value Date   TRIG 59 12/23/2023   Lab Results  Component Value Date   CHOLHDL 3.3 12/23/2023   Lab Results  Component Value Date   CREATININE 0.93 01/06/2024   Lab Results  Component Value Date   GFR 61.36 01/06/2024      Component Value Date/Time   NA 137 01/06/2024 1117   NA 143 03/19/2023 1532   NA 143 01/08/2015 1547   K 4.3 01/06/2024 1117   K 4.1 01/08/2015 1547   CL 100 01/06/2024 1117   CL 109 (H) 11/28/2012 1555   CO2 31 01/06/2024 1117   CO2 25 01/08/2015 1547   GLUCOSE 109 (H) 01/06/2024 1117   GLUCOSE 81 01/08/2015 1547   GLUCOSE 72 11/28/2012 1555   BUN 12 01/06/2024 1117   BUN 14 03/19/2023 1532   BUN 11.6 01/08/2015 1547   CREATININE 0.93 01/06/2024 1117   CREATININE 1.12 (H) 12/10/2023 1558   CREATININE 0.8 01/08/2015 1547   CALCIUM 9.2 01/06/2024 1117   CALCIUM 8.8 01/08/2015 1547   PROT 7.4 01/06/2024 1117   PROT 6.5 03/19/2023 1532   PROT 7.0 01/08/2015 1547   ALBUMIN 2.9 (L) 01/06/2024 1117   ALBUMIN 3.9 03/19/2023 1532   ALBUMIN 3.5 01/08/2015 1547   AST 29 01/06/2024 1117   AST 43 (H) 01/08/2015 1547   ALT 18 01/06/2024 1117    ALT 26 01/08/2015 1547   ALKPHOS 146 (H) 01/06/2024 1117   ALKPHOS 98 01/08/2015 1547   BILITOT 1.1 01/06/2024 1117   BILITOT 1.1 03/19/2023 1532   BILITOT 0.76 01/08/2015 1547   GFRNONAA >60 12/24/2023 0524   GFRAA >60 08/17/2020 0135      Latest Ref Rng & Units 01/06/2024   11:17 AM 12/24/2023    5:24 AM 12/23/2023    5:46 AM  BMP  Glucose  70 - 99 mg/dL 295  83  93   BUN 6 - 23 mg/dL 12  16  18    Creatinine 0.40 - 1.20 mg/dL 6.21  3.08  6.57   Sodium 135 - 145 mEq/L 137  134  139   Potassium 3.5 - 5.1 mEq/L 4.3  3.7  3.9   Chloride 96 - 112 mEq/L 100  109  112   CO2 19 - 32 mEq/L 31  19  19    Calcium 8.4 - 10.5 mg/dL 9.2  8.7  8.6        Component Value Date/Time   WBC 5.7 01/06/2024 1117   RBC 3.17 (L) 01/06/2024 1117   HGB 11.2 (L) 01/06/2024 1117   HGB 11.1 04/02/2023 1205   HGB 12.7 01/08/2015 1546   HCT 32.5 (L) 01/06/2024 1117   HCT 32.3 (L) 04/02/2023 1205   HCT 39.7 01/08/2015 1546   PLT 407.0 (H) 01/06/2024 1117   PLT 98 (LL) 04/02/2023 1205   MCV 102.6 (H) 01/06/2024 1117   MCV 99 (H) 04/02/2023 1205   MCV 97.8 01/08/2015 1546   MCH 34.0 12/24/2023 0524   MCHC 34.5 01/06/2024 1117   RDW 15.4 01/06/2024 1117   RDW 15.8 (H) 04/02/2023 1205   RDW 13.4 01/08/2015 1546   LYMPHSABS 0.7 12/23/2023 0546   LYMPHSABS 1.2 01/08/2015 1546   MONOABS 0.9 12/23/2023 0546   MONOABS 0.3 01/08/2015 1546   EOSABS 0.1 12/23/2023 0546   EOSABS 0.2 01/08/2015 1546   BASOSABS 0.0 12/23/2023 0546   BASOSABS 0.0 01/08/2015 1546   Lab Results  Component Value Date   TSH 0.047 (L) 12/23/2023   TSH 0.20 (L) 09/09/2023   TSH 1.260 02/25/2023   FREET4 1.42 (H) 12/23/2023   FREET4 0.88 01/15/2021   FREET4 0.79 08/01/2020         Parts of this note may have been dictated using voice recognition software. There may be variances in spelling and vocabulary which are unintentional. Not all errors are proofread. Please notify the [facility] if any discrepancies are noted or if the  meaning of any statement is not clear.

## 2024-03-07 ENCOUNTER — Encounter: Payer: Self-pay | Admitting: "Endocrinology

## 2024-03-07 LAB — ACTH STIMULATION, 3 SPECIMENS
Coritsol: 15.4 ug/dL
Cortisol: 27.5 ug/dL
Cortisol: 32 ug/dL
TIME 1: 324
TIME 3: 433
Time 2: 403

## 2024-03-07 LAB — T4, FREE: Free T4: 1.3 ng/dL (ref 0.8–1.8)

## 2024-03-07 LAB — TSH
Free T4: 1.3 ng/dL
TSH: 2.41 m[IU]/L (ref 0.40–4.50)

## 2024-03-11 ENCOUNTER — Other Ambulatory Visit: Payer: Self-pay | Admitting: Nurse Practitioner

## 2024-03-11 DIAGNOSIS — E039 Hypothyroidism, unspecified: Secondary | ICD-10-CM

## 2024-03-14 ENCOUNTER — Other Ambulatory Visit: Payer: Self-pay | Admitting: "Endocrinology

## 2024-03-14 DIAGNOSIS — E039 Hypothyroidism, unspecified: Secondary | ICD-10-CM

## 2024-03-15 ENCOUNTER — Telehealth: Payer: Self-pay | Admitting: Neurology

## 2024-03-15 ENCOUNTER — Ambulatory Visit: Payer: Medicare Other | Admitting: Neurology

## 2024-03-15 NOTE — Telephone Encounter (Signed)
 Appointment details confirmed

## 2024-03-16 ENCOUNTER — Encounter: Payer: Self-pay | Admitting: Neurology

## 2024-03-16 ENCOUNTER — Ambulatory Visit: Admitting: Neurology

## 2024-03-16 VITALS — BP 105/68 | HR 76 | Ht 64.0 in | Wt 152.0 lb

## 2024-03-16 DIAGNOSIS — I639 Cerebral infarction, unspecified: Secondary | ICD-10-CM | POA: Diagnosis not present

## 2024-03-16 NOTE — Progress Notes (Signed)
 Chief Complaint  Patient presents with   New Patient (Initial Visit)    Pt in 14 with husband Pt here for stroke f/u Pt gait is off  Husband states patient has increased confusion       ASSESSMENT AND PLAN  [name] is a 73 y.o. female   Multiple small embolic stroke  Echocardiogram with bubble study showed right-to-left shunt, with mild elevation of pulmonary artery, moderate right ventricular hypertrophy,  Complicated medical history, including cecum adenocarcinoma, cirrhosis secondary to oxaliplatin induced portal hypertension,  Continue aspirin  81 mg, encourage water intake,     DIAGNOSTIC DATA (LABS, IMAGING, TESTING) - I reviewed patient records, labs, notes, testing and imaging myself where available.   MEDICAL HISTORY:  [name] is a 73 year old female accompanied by her husband follow-up for embolic stroke, seen in request by her primary care NP Winthrop Hawks    History is obtained from the patient and review of electronic medical records. I personally reviewed pertinent available imaging films in PACS.   PMHx of  Stroke Hypothyroidism Cecal adenocarcinoma, s/p resection and chemotherapy in 2004 Liver disease, cirrhosis,  Skin Cancer  Hypertension  She was seen by neurohospitalist Dr. Bonnita Buttner on December 23, 2023 admission," she is not acting like her self", even though she suffered multiple hepatic encephalopathy in the past, that particular episode were different  Had MRI of the brain showed multiple small acute/subacute infarction involving right cerebellum, bilateral cerebral hemisphere, also evidence of chronic small vessel disease  She has complicated medical history  I reviewed [name] GI evaluation  She has a history of stage III adenocarcinoma of the cecum, with matted lymph node mass measuring 4 cm, status post hemicolectomy and adjuvant FOLFX x 12 cycles from March to November 2004, also has multiple basal cell carcinoma of the  skin  Decompensated cirrhosis secondary to oxaliplatin induced portal hypertension, portal hypertension and the possible component of mASH, with esophageal varices, variceal hemorrhage in 2012, underwent TIPS at [facility], also has hepatic encephalopathy, thrombocytopenia,  She had multiple hospitalization for encephalopathy, acute kidney failure, cystitis, bradycardia with SVTs, required large dose of lactulose  up to 60 mg every 3 hours, had issue with constipation, resolved quickly often with enemas, no history of ascites  She also developed adrenal insufficiency, was treated with hydrocortisone , stop it few months ago   She also has neutropenia, likely due to bone marrow suppression from cirrhosis.  Reviewed cardiology evaluation from March 2025, ultrasound of carotid artery was negative, 2D echo showed ejection fraction 60 to 65%, positive bubble study, abdominal ultrasound, showed narrowing of TIPS based on velocity from previous procedure, may consider repeat stenting,  30 days cardiac monitoring in March showed no malignant arrhythmia  She has been stable over the past few months, on aspirin  81 mg since hospital discharge,  A1c was 4.8, INR 1.5, low albumin  of 2.9, abnormal liver functional test with mild elevation of alkaline phosphatase, Normal TSH, free T4  PHYSICAL EXAM:   Vitals:   03/16/24 0815  BP: 105/68  Pulse: 76  Weight: 152 lb (68.9 kg)  Height: 5\' 4"  (1.626 m)    Body mass index is 26.09 kg/m.  PHYSICAL EXAMNIATION:  Gen: NAD, conversant, well nourised, well groomed                     Cardiovascular: Regular rate rhythm, no peripheral edema, warm, nontender. Eyes: Conjunctivae clear without exudates or hemorrhage Neck: Supple, no carotid bruits.  Pulmonary: Clear to auscultation bilaterally   NEUROLOGICAL EXAM:  MENTAL STATUS: Speech/cognition: Slow reaction, oriented to history taking and casual conversation CRANIAL NERVES: CN II: Visual fields  are full to confrontation. Pupils are round equal and briskly reactive to light. CN III, IV, VI: extraocular movement are normal. No ptosis. CN V: Facial sensation is intact to light touch CN VII: Face is symmetric with normal eye closure  CN VIII: Hearing is normal to causal conversation. CN IX, X: Phonation is normal. CN XI: Head turning and shoulder shrug are intact  MOTOR: There is no pronator drift of out-stretched arms. Muscle bulk and tone are normal. Muscle strength is normal.  REFLEXES: Reflexes are 1 and symmetric at the biceps, triceps, knees, and ankles. Plantar responses are flexor.  SENSORY: Intact to light touch, pinprick and vibratory sensation are intact in fingers and toes.  COORDINATION: There is no trunk or limb dysmetria noted.  GAIT/STANCE: Push-up to get up from seated position, cautious  REVIEW OF SYSTEMS:  Full 14 system review of systems performed and notable only for as above All other review of systems were negative.   ALLERGIES: Allergies  Allergen Reactions   Contrast Media [Iodinated Contrast Media] Hives    HOME MEDICATIONS: Current Outpatient Medications  Medication Sig Dispense Refill   acidophilus (RISAQUAD) CAPS capsule Take 1 capsule by mouth daily.     aspirin  EC 81 MG tablet Take 1 tablet (81 mg total) by mouth daily. Swallow whole. 90 tablet 0   CRANBERRY PO Take 1 tablet by mouth 2 (two) times daily.     estradiol (ESTRACE) 0.1 MG/GM vaginal cream Place 1 Applicatorful vaginally every other day.     furosemide  (LASIX ) 20 MG tablet Take 20-40 mg by mouth See admin instructions. Take blood pressure and depending on reading take 20mg  or 40mg  by mouth daily.     lactulose  (CHRONULAC ) 10 GM/15ML solution Take 45 mLs (30 g total) by mouth 3 (three) times daily. 236 mL 0   levOCARNitine  (CARNITOR ) 330 MG tablet Take 330 mg by mouth 3 (three) times daily.     levothyroxine  (SYNTHROID ) 25 MCG tablet Take 25 mcg by mouth daily before  breakfast.     liver oil-zinc  oxide (DESITIN) 40 % ointment Apply topically daily as needed for irritation. 56.7 g 0   Multiple Vitamin (MULTI-VITAMIN) tablet Take 1 tablet by mouth daily.     polyethylene glycol (MIRALAX  / GLYCOLAX ) 17 g packet Take 17 g by mouth daily as needed for mild constipation. 14 each 0   rifaximin  (XIFAXAN ) 550 MG TABS tablet Take 1 tablet (550 mg total) by mouth 2 (two) times daily. (Patient taking differently: Take 550 mg by mouth as needed. Flareups) 60 tablet 0   spironolactone  (ALDACTONE ) 25 MG tablet Take 50 mg by mouth daily.     No current facility-administered medications for this visit.    PAST MEDICAL HISTORY: Past Medical History:  Diagnosis Date   Acute urinary retention 05/04/2022   Allergy 2006 ?   Contrast dye   Arthritis 2016 ??   Knees and thumb   Cancer [facility])    cecum   Cataract 2021   Surgery scheduled July 2023   Colon cancer [facility]) 2003   Elevated liver function tests    Esophageal varices (HCC)    Heart murmur On file   Hemorrhage of gastrointestinal tract 05/04/2011   Hypertension 2021   Hypothyroidism    Iron deficiency anemia    Liver disease  chemotherapy complication, per pt, shunts placed to bypass liver   Malignant neoplasm of cecum (HCC)    Portal hypertension (HCC)    Skin cancer 2019   Splenomegaly     PAST SURGICAL HISTORY: Past Surgical History:  Procedure Laterality Date   COLON SURGERY  2004   Cancer   COSMETIC SURGERY  2021   Skin cancer   ESOPHAGEAL VARICE LIGATION     EYE SURGERY     HEMICOLECTOMY  01/08/2003   IR RADIOLOGIST EVAL & MGMT  12/20/2020   IR RADIOLOGIST EVAL & MGMT  05/29/2021   LIVER SURGERY     shunts placed after chemo complication   SKIN FULL THICKNESS GRAFT N/A 09/12/2019   Procedure: debridement and FTSG to the nose from left upper arm;  Surgeon: Barb Bonito, MD;  Location: [facility];  Service: Plastics;  Laterality: N/A;  2 hours, please   TIPS PROCEDURE       FAMILY HISTORY: Family History  Problem Relation Age of Onset   Arthritis Mother    Hearing loss Mother    Heart disease Mother    Hypertension Mother    Miscarriages / [place] Mother    Arthritis Father    Diabetes Father    Heart disease Father    Cancer Maternal Aunt    Breast cancer Neg Hx    Colon cancer Neg Hx    Esophageal cancer Neg Hx    Pancreatic cancer Neg Hx    Stomach cancer Neg Hx    Stroke Neg Hx     SOCIAL HISTORY: Social History   Socioeconomic History   Marital status: Married    Spouse name: Animal nutritionist   Number of children: 2   Years of education: Not on file   Highest education level: Some college, no degree  Occupational History   Occupation: Comptroller  Tobacco Use   Smoking status: Never   Smokeless tobacco: Never   Tobacco comments:    Never smoked  Vaping Use   Vaping status: Never Used  Substance and Sexual Activity   Alcohol use: Never   Drug use: Never   Sexual activity: Not Currently    Partners: Male  Other Topics Concern   Not on file  Social History Narrative   12/04/20   From: the area   Living: with husband, Dennard Fisher (1994)   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      Family: 2 children Designer, fashion/clothing and Optometrist - 2 grandchildren - nearby      Enjoys: read      Exercise: trying to get back to exercise   Diet: healthy, limits fast foods      Safety   Seat belts: Yes    Guns: Yes  and secure   Safe in relationships: Yes    Social Drivers of Corporate investment banker Strain: Low Risk  (12/09/2023)   Overall Financial Resource Strain (CARDIA)    Difficulty of Paying Living Expenses: Not very hard  Food Insecurity: No Food Insecurity (12/27/2023)   Hunger Vital Sign    Worried About Running Out of Food in the Last Year: Never true    Ran Out of Food in the Last Year: Never true  Transportation Needs: No Transportation Needs (12/27/2023)   PRAPARE - Administrator, Civil Service (Medical): No    Lack of  Transportation (Non-Medical): No  Physical Activity: Insufficiently Active (12/09/2023)   Exercise Vital Sign    Days of Exercise  per Week: 1 day    Minutes of Exercise per Session: 20 min  Stress: No Stress Concern Present (12/09/2023)   [employer] of Occupational Health - Occupational Stress Questionnaire    Feeling of Stress : Not at all  Social Connections: Moderately Isolated (12/27/2023)   Social Connection and Isolation Panel [NHANES]    Frequency of Communication with Friends and Family: More than three times a week    Frequency of Social Gatherings with Friends and Family: More than three times a week    Attends Religious Services: Never    Database administrator or Organizations: No    Attends Banker Meetings: Never    Marital Status: Married  Catering manager Violence: Not At Risk (12/27/2023)   Humiliation, Afraid, Rape, and Kick questionnaire    Fear of Current or Ex-Partner: No    Emotionally Abused: No    Physically Abused: No    Sexually Abused: No      Phebe Brasil, M.D. Ph.D.  [facility] [address] [place], [place] [address] Ph: [phone] Fax: [phone]  CC:  Wynetta Heckle, MD [address] [place],  [place] [address]  Dorothe Gaster, NP

## 2024-03-27 ENCOUNTER — Ambulatory Visit: Payer: Medicare Other

## 2024-03-27 VITALS — BP 105/68 | Ht 64.0 in | Wt 145.0 lb

## 2024-03-27 DIAGNOSIS — Z5320 Procedure and treatment not carried out because of patient's decision for unspecified reasons: Secondary | ICD-10-CM

## 2024-03-27 DIAGNOSIS — Z0000 Encounter for general adult medical examination without abnormal findings: Secondary | ICD-10-CM

## 2024-03-27 DIAGNOSIS — Z2821 Immunization not carried out because of patient refusal: Secondary | ICD-10-CM | POA: Diagnosis not present

## 2024-03-27 NOTE — Patient Instructions (Signed)
 [name] , Thank you for taking time to come for your Medicare Wellness Visit. I appreciate your ongoing commitment to your health goals. Please review the following plan we discussed and let me know if I can assist you in the future.   Referrals/Orders/Follow-Ups/Clinician Recommendations: follow up as scheduled.  This is a list of the screening recommended for you and due dates:  Health Maintenance  Topic Date Due   DTaP/Tdap/Td vaccine (1 - Tdap) Never done   Zoster (Shingles) Vaccine (1 of 2) Never done   Colon Cancer Screening  06/25/2020   Flu Shot  06/23/2024   Medicare Annual Wellness Visit  03/27/2025   Mammogram  11/07/2025   Pneumonia Vaccine  Completed   DEXA scan (bone density measurement)  Completed   Hepatitis C Screening  Completed   HPV Vaccine  Aged Out   Meningitis B Vaccine  Aged Out   COVID-19 Vaccine  Discontinued    Advanced directives: (Declined) Advance directive discussed with you today. Even though you declined this today, please call our office should you change your mind, and we can give you the proper paperwork for you to fill out.  Next Medicare Annual Wellness Visit scheduled for next year: Yes  Have you seen your provider in the last 6 months (3 months if uncontrolled diabetes)? Yes

## 2024-03-27 NOTE — Progress Notes (Signed)
 Because this visit was a virtual/telehealth visit,  certain criteria was not obtained, such a blood pressure, CBG if applicable, and timed get up and go. Any medications not marked as "taking" were not mentioned during the medication reconciliation part of the visit. Any vitals not documented were not able to be obtained due to this being a telehealth visit or patient was unable to self-report a recent blood pressure reading due to a lack of equipment at home via telehealth. Vitals that have been documented are verbally provided by the patient.  Subjective:   [name] is a 73 y.o. who presents for a Medicare Wellness preventive visit.  Visit Complete: Virtual I connected with  [name] on 03/27/24 by a audio enabled telemedicine application and verified that I am speaking with the correct person using two identifiers.  Patient Location: Home  Provider Location: Home Office  I discussed the limitations of evaluation and management by telemedicine. The patient expressed understanding and agreed to proceed.  Vital Signs: Because this visit was a virtual/telehealth visit, some criteria may be missing or patient reported. Any vitals not documented were not able to be obtained and vitals that have been documented are patient reported.  VideoDeclined- This patient declined Librarian, academic. Therefore the visit was completed with audio only.  Persons Participating in Visit: Patient.  AWV Questionnaire: No: Patient Medicare AWV questionnaire was not completed prior to this visit.  Cardiac Risk Factors include: advanced age (>25men, >60 women);hypertension     Objective:    Today's Vitals   03/27/24 1357  BP: 105/68  Weight: 145 lb (65.8 kg)  Height: 5\' 4"  (1.626 m)   Body mass index is 24.89 kg/m.     03/27/2024    1:57 PM 12/22/2023    5:00 PM 05/12/2023    3:25 PM 04/21/2023   11:15 PM 04/21/2023    5:26 PM 03/24/2023    2:54 PM 02/26/2023    12:47 AM  Advanced Directives  Does Patient Have a Medical Advance Directive? No No No No No No No  Would patient like information on creating a medical advance directive? No - Patient declined No - Patient declined No - Patient declined No - Patient declined No - Patient declined No - Patient declined No - Patient declined    Current Medications (verified) Outpatient Encounter Medications as of 03/27/2024  Medication Sig   acidophilus (RISAQUAD) CAPS capsule Take 1 capsule by mouth daily.   aspirin  EC 81 MG tablet Take 1 tablet (81 mg total) by mouth daily. Swallow whole.   CRANBERRY PO Take 1 tablet by mouth 2 (two) times daily.   estradiol (ESTRACE) 0.1 MG/GM vaginal cream Place 1 Applicatorful vaginally every other day.   furosemide  (LASIX ) 20 MG tablet Take 20-40 mg by mouth See admin instructions. Take blood pressure and depending on reading take 20mg  or 40mg  by mouth daily.   lactulose  (CHRONULAC ) 10 GM/15ML solution Take 45 mLs (30 g total) by mouth 3 (three) times daily.   levOCARNitine  (CARNITOR ) 330 MG tablet Take 330 mg by mouth 3 (three) times daily.   levothyroxine  (SYNTHROID ) 25 MCG tablet Take 25 mcg by mouth daily before breakfast.   liver oil-zinc  oxide (DESITIN) 40 % ointment Apply topically daily as needed for irritation.   Multiple Vitamin (MULTI-VITAMIN) tablet Take 1 tablet by mouth daily.   polyethylene glycol (MIRALAX  / GLYCOLAX ) 17 g packet Take 17 g by mouth daily as needed for mild constipation.   rifaximin  (  XIFAXAN ) 550 MG TABS tablet Take 1 tablet (550 mg total) by mouth 2 (two) times daily. (Patient taking differently: Take 550 mg by mouth as needed. Flareups)   spironolactone  (ALDACTONE ) 25 MG tablet Take 50 mg by mouth daily.   No facility-administered encounter medications on file as of 03/27/2024.    Allergies (verified) Contrast media [iodinated contrast media]   History: Past Medical History:  Diagnosis Date   Acute urinary retention 05/04/2022    Allergy 2006 ?   Contrast dye   Arthritis 2016 ??   Knees and thumb   Cancer (HCC)    cecum   Cataract 2021   Surgery scheduled July 2023   Colon cancer [facility]) 2003   Elevated liver function tests    Esophageal varices (HCC)    Heart murmur On file   Hemorrhage of gastrointestinal tract 05/04/2011   Hypertension 2021   Hypothyroidism    Iron deficiency anemia    Liver disease    chemotherapy complication, per pt, shunts placed to bypass liver   Malignant neoplasm of cecum (HCC)    Portal hypertension (HCC)    Skin cancer 2019   Splenomegaly    Past Surgical History:  Procedure Laterality Date   COLON SURGERY  2004   Cancer   COSMETIC SURGERY  2021   Skin cancer   ESOPHAGEAL VARICE LIGATION     EYE SURGERY     HEMICOLECTOMY  01/08/2003   IR RADIOLOGIST EVAL & MGMT  12/20/2020   IR RADIOLOGIST EVAL & MGMT  05/29/2021   LIVER SURGERY     shunts placed after chemo complication   SKIN FULL THICKNESS GRAFT N/A 09/12/2019   Procedure: debridement and FTSG to the nose from left upper arm;  Surgeon: Barb Bonito, MD;  Location: [facility];  Service: Plastics;  Laterality: N/A;  2 hours, please   TIPS PROCEDURE     Family History  Problem Relation Age of Onset   Arthritis Mother    Hearing loss Mother    Heart disease Mother    Hypertension Mother    Miscarriages / [place] Mother    Arthritis Father    Diabetes Father    Heart disease Father    Cancer Maternal Aunt    Breast cancer Neg Hx    Colon cancer Neg Hx    Esophageal cancer Neg Hx    Pancreatic cancer Neg Hx    Stomach cancer Neg Hx    Stroke Neg Hx    Social History   Socioeconomic History   Marital status: Married    Spouse name: Animal nutritionist   Number of children: 2   Years of education: Not on file   Highest education level: Some college, no degree  Occupational History   Occupation: Comptroller  Tobacco Use   Smoking status: Never   Smokeless tobacco: Never   Tobacco comments:     Never smoked  Vaping Use   Vaping status: Never Used  Substance and Sexual Activity   Alcohol use: Never   Drug use: Never   Sexual activity: Not Currently    Partners: Male  Other Topics Concern   Not on file  Social History Narrative   12/04/20   From: the area   Living: with husband, Dennard Fisher (1994)   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      Family: 2 children Trevor Fudge and Optometrist - 2 grandchildren - nearby      Enjoys: read      Exercise:  trying to get back to exercise   Diet: healthy, limits fast foods      Safety   Seat belts: Yes    Guns: Yes  and secure   Safe in relationships: Yes    Social Drivers of Health   Financial Resource Strain: Low Risk  (03/27/2024)   Overall Financial Resource Strain (CARDIA)    Difficulty of Paying Living Expenses: Not very hard  Food Insecurity: No Food Insecurity (03/27/2024)   Hunger Vital Sign    Worried About Running Out of Food in the Last Year: Never true    Ran Out of Food in the Last Year: Never true  Transportation Needs: No Transportation Needs (03/27/2024)   PRAPARE - Administrator, Civil Service (Medical): No    Lack of Transportation (Non-Medical): No  Physical Activity: Insufficiently Active (03/27/2024)   Exercise Vital Sign    Days of Exercise per Week: 1 day    Minutes of Exercise per Session: 20 min  Stress: No Stress Concern Present (03/27/2024)   [employer] of Occupational Health - Occupational Stress Questionnaire    Feeling of Stress : Not at all  Social Connections: Moderately Isolated (03/27/2024)   Social Connection and Isolation Panel [NHANES]    Frequency of Communication with Friends and Family: More than three times a week    Frequency of Social Gatherings with Friends and Family: More than three times a week    Attends Religious Services: Never    Database administrator or Organizations: No    Attends Engineer, structural: Never    Marital Status: Married    Tobacco  Counseling Counseling given: Not Answered Tobacco comments: Never smoked    Clinical Intake:  Pre-visit preparation completed: Yes  Pain : No/denies pain     BMI - recorded: 24.89 Nutritional Status: BMI of 19-24  Normal Nutritional Risks: None Diabetes: No  Lab Results  Component Value Date   HGBA1C 4.8 12/23/2023   HGBA1C 5.1 08/14/2020     How often do you need to have someone help you when you read instructions, pamphlets, or other written materials from your doctor or pharmacy?: 1 - Never What is the last grade level you completed in school?: 1st year of college     Information entered by :: [employer]   Activities of Daily Living     03/27/2024    1:10 PM 12/22/2023    5:00 PM  In your present state of health, do you have any difficulty performing the following activities:  Hearing? 0 0  Vision? 0 0  Difficulty concentrating or making decisions? 0 1  Walking or climbing stairs? 1   Dressing or bathing? 0   Doing errands, shopping? 1 0  Preparing Food and eating ? N   Using the Toilet? N   In the past six months, have you accidently leaked urine? N   Do you have problems with loss of bowel control? N   Managing your Medications? N   Managing your Finances? N   Housekeeping or managing your Housekeeping? N     Patient Care Team: Dorothe Gaster, NP as PCP - General (Nurse Practitioner) Lindle Rhea, MD (Inactive) as Consulting Physician (Gastroenterology) Duey Ghent, CRNP as Nurse Practitioner (Nurse Practitioner) Jonathan Neighbor, [facility] (Inactive) as Pharmacist (Pharmacist) Jann Melody, MD as Consulting Physician (Cardiology)  Indicate any recent Medical Services you may have received from other than [facility] providers in the past year (date may  be approximate).     Assessment:   This is a routine wellness examination for [facility].  Hearing/Vision screen Hearing Screening - Comments:: No hearing difficulties Vision Screening  - Comments:: No vision difficulties   Goals Addressed             This Visit's Progress    DIET - EAT MORE FRUITS AND VEGETABLES   On track      Depression Screen     03/27/2024    2:04 PM 01/06/2024   10:37 AM 08/04/2023    9:03 AM 06/09/2023    2:54 PM 03/24/2023    3:02 PM 02/24/2023    8:14 AM 02/04/2023    9:41 AM  PHQ 2/9 Scores  PHQ - 2 Score 0 0 0 0 0 0 0  PHQ- 9 Score 0 0 2 0 0 2 2    Fall Risk     03/27/2024    1:10 PM 01/06/2024   10:41 AM 01/06/2024   10:40 AM 08/04/2023    9:02 AM 06/09/2023    2:54 PM  Fall Risk   Falls in the past year? 0 1 0 1 1  Number falls in past yr: 0 0 0 1 1  Injury with Fall? 0 1 0 1 1  Risk for fall due to : No Fall Risks Impaired balance/gait No Fall Risks History of fall(s) History of fall(s);Other (Comment)  Follow up  Falls evaluation completed Falls evaluation completed Falls evaluation completed Falls evaluation completed    MEDICARE RISK AT HOME:  Medicare Risk at Home Any stairs in or around the home?: No If so, are there any without handrails?: No Home free of loose throw rugs in walkways, pet beds, electrical cords, etc?: Yes Adequate lighting in your home to reduce risk of falls?: Yes Life alert?: No Use of a cane, walker or w/c?: Yes Grab bars in the bathroom?: No Shower chair or bench in shower?: Yes Elevated toilet seat or a handicapped toilet?: No  TIMED UP AND GO:  Was the test performed?  No  Cognitive Function: 6CIT completed    01/24/2020    3:38 PM  MMSE - Mini Mental State Exam  Orientation to time 5  Orientation to Place 5  Registration 3  Attention/ Calculation 5  Recall 3  Language- repeat 1        03/27/2024    2:00 PM 03/24/2023    2:56 PM 04/01/2022    3:13 PM  6CIT Screen  What Year? 0 points 0 points 0 points  What month? 0 points 0 points 0 points  What time? 0 points 0 points 0 points  Count back from 20 0 points 0 points 0 points  Months in reverse 0 points 0 points 0 points  Repeat  phrase 0 points 0 points 0 points  Total Score 0 points 0 points 0 points    Immunizations Immunization History  Administered Date(s) Administered   PNEUMOCOCCAL CONJUGATE-20 04/25/2023    Screening Tests Health Maintenance  Topic Date Due   DTaP/Tdap/Td (1 - Tdap) Never done   Zoster Vaccines- Shingrix (1 of 2) Never done   Colonoscopy  06/25/2020   INFLUENZA VACCINE  06/23/2024   Medicare Annual Wellness (AWV)  03/27/2025   MAMMOGRAM  11/07/2025   Pneumonia Vaccine 46+ Years old  Completed   DEXA SCAN  Completed   Hepatitis C Screening  Completed   HPV VACCINES  Aged Out   Meningococcal B Vaccine  Aged Out  COVID-19 Vaccine  Discontinued    Health Maintenance  Health Maintenance Due  Topic Date Due   DTaP/Tdap/Td (1 - Tdap) Never done   Zoster Vaccines- Shingrix (1 of 2) Never done   Colonoscopy  06/25/2020   Health Maintenance Items Addressed: patient declined health maintenance  Additional Screening:  Vision Screening: Recommended annual ophthalmology exams for early detection of glaucoma and other disorders of the eye.  Dental Screening: Recommended annual dental exams for proper oral hygiene  Community Resource Referral / Chronic Care Management: CRR required this visit?  No   CCM required this visit?  No     Plan:     I have personally reviewed and noted the following in the patient's chart:   Medical and social history Use of alcohol, tobacco or illicit drugs  Current medications and supplements including opioid prescriptions. Patient is not currently taking opioid prescriptions. Functional ability and status Nutritional status Physical activity Advanced directives List of other physicians Hospitalizations, surgeries, and ER visits in previous 12 months Vitals Screenings to include cognitive, depression, and falls Referrals and appointments  In addition, I have reviewed and discussed with patient certain preventive protocols, quality  metrics, and best practice recommendations. A written personalized care plan for preventive services as well as general preventive health recommendations were provided to patient.     Freeda Jerry, [place]   03/27/2024   After Visit Summary: (MyChart) Due to this being a telephonic visit, the after visit summary with patients personalized plan was offered to patient via MyChart   Notes: Nothing significant to report at this time.

## 2024-03-30 ENCOUNTER — Inpatient Hospital Stay (HOSPITAL_COMMUNITY)
Admission: EM | Admit: 2024-03-30 | Discharge: 2024-04-05 | DRG: 442 | Disposition: A | Discharge status: Home / Self Care | Attending: Internal Medicine | Admitting: Internal Medicine

## 2024-03-30 ENCOUNTER — Other Ambulatory Visit: Payer: Self-pay

## 2024-03-30 ENCOUNTER — Encounter (HOSPITAL_COMMUNITY): Payer: Self-pay | Admitting: Internal Medicine

## 2024-03-30 DIAGNOSIS — K8020 Calculus of gallbladder without cholecystitis without obstruction: Secondary | ICD-10-CM | POA: Diagnosis not present

## 2024-03-30 DIAGNOSIS — Z515 Encounter for palliative care: Secondary | ICD-10-CM

## 2024-03-30 DIAGNOSIS — Z91041 Radiographic dye allergy status: Secondary | ICD-10-CM | POA: Diagnosis not present

## 2024-03-30 DIAGNOSIS — I8510 Secondary esophageal varices without bleeding: Secondary | ICD-10-CM | POA: Diagnosis not present

## 2024-03-30 DIAGNOSIS — E875 Hyperkalemia: Secondary | ICD-10-CM | POA: Insufficient documentation

## 2024-03-30 DIAGNOSIS — Z8673 Personal history of transient ischemic attack (TIA), and cerebral infarction without residual deficits: Secondary | ICD-10-CM | POA: Diagnosis not present

## 2024-03-30 DIAGNOSIS — D696 Thrombocytopenia, unspecified: Secondary | ICD-10-CM | POA: Diagnosis not present

## 2024-03-30 DIAGNOSIS — D539 Nutritional anemia, unspecified: Secondary | ICD-10-CM | POA: Insufficient documentation

## 2024-03-30 DIAGNOSIS — K7460 Unspecified cirrhosis of liver: Secondary | ICD-10-CM | POA: Diagnosis not present

## 2024-03-30 DIAGNOSIS — K7682 Hepatic encephalopathy: Secondary | ICD-10-CM

## 2024-03-30 DIAGNOSIS — I8500 Esophageal varices without bleeding: Secondary | ICD-10-CM | POA: Diagnosis not present

## 2024-03-30 DIAGNOSIS — E039 Hypothyroidism, unspecified: Secondary | ICD-10-CM | POA: Diagnosis present

## 2024-03-30 DIAGNOSIS — K5900 Constipation, unspecified: Secondary | ICD-10-CM | POA: Diagnosis not present

## 2024-03-30 DIAGNOSIS — Z79899 Other long term (current) drug therapy: Secondary | ICD-10-CM

## 2024-03-30 DIAGNOSIS — Z85828 Personal history of other malignant neoplasm of skin: Secondary | ICD-10-CM

## 2024-03-30 DIAGNOSIS — Z8249 Family history of ischemic heart disease and other diseases of the circulatory system: Secondary | ICD-10-CM

## 2024-03-30 DIAGNOSIS — I639 Cerebral infarction, unspecified: Secondary | ICD-10-CM | POA: Diagnosis present

## 2024-03-30 DIAGNOSIS — Z7409 Other reduced mobility: Secondary | ICD-10-CM | POA: Diagnosis not present

## 2024-03-30 DIAGNOSIS — Z79890 Hormone replacement therapy (postmenopausal): Secondary | ICD-10-CM

## 2024-03-30 DIAGNOSIS — I10 Essential (primary) hypertension: Secondary | ICD-10-CM | POA: Diagnosis present

## 2024-03-30 DIAGNOSIS — T68XXXA Hypothermia, initial encounter: Secondary | ICD-10-CM | POA: Diagnosis not present

## 2024-03-30 DIAGNOSIS — R112 Nausea with vomiting, unspecified: Secondary | ICD-10-CM | POA: Diagnosis not present

## 2024-03-30 DIAGNOSIS — K766 Portal hypertension: Secondary | ICD-10-CM | POA: Diagnosis present

## 2024-03-30 DIAGNOSIS — E162 Hypoglycemia, unspecified: Secondary | ICD-10-CM | POA: Insufficient documentation

## 2024-03-30 DIAGNOSIS — R680 Hypothermia, not associated with low environmental temperature: Secondary | ICD-10-CM | POA: Diagnosis present

## 2024-03-30 DIAGNOSIS — Z85038 Personal history of other malignant neoplasm of large intestine: Secondary | ICD-10-CM | POA: Diagnosis present

## 2024-03-30 DIAGNOSIS — K7581 Nonalcoholic steatohepatitis (NASH): Secondary | ICD-10-CM | POA: Diagnosis not present

## 2024-03-30 DIAGNOSIS — Z7982 Long term (current) use of aspirin: Secondary | ICD-10-CM | POA: Diagnosis not present

## 2024-03-30 DIAGNOSIS — T500X5A Adverse effect of mineralocorticoids and their antagonists, initial encounter: Secondary | ICD-10-CM | POA: Diagnosis not present

## 2024-03-30 DIAGNOSIS — T451X5S Adverse effect of antineoplastic and immunosuppressive drugs, sequela: Secondary | ICD-10-CM | POA: Diagnosis not present

## 2024-03-30 DIAGNOSIS — C180 Malignant neoplasm of cecum: Secondary | ICD-10-CM | POA: Diagnosis present

## 2024-03-30 DIAGNOSIS — R531 Weakness: Secondary | ICD-10-CM

## 2024-03-30 DIAGNOSIS — R4182 Altered mental status, unspecified: Secondary | ICD-10-CM | POA: Diagnosis present

## 2024-03-30 DIAGNOSIS — Z452 Encounter for adjustment and management of vascular access device: Secondary | ICD-10-CM | POA: Diagnosis not present

## 2024-03-30 DIAGNOSIS — Z9221 Personal history of antineoplastic chemotherapy: Secondary | ICD-10-CM | POA: Diagnosis not present

## 2024-03-30 DIAGNOSIS — K7290 Hepatic failure, unspecified without coma: Secondary | ICD-10-CM | POA: Diagnosis not present

## 2024-03-30 DIAGNOSIS — R0989 Other specified symptoms and signs involving the circulatory and respiratory systems: Secondary | ICD-10-CM | POA: Diagnosis not present

## 2024-03-30 HISTORY — DX: Hepatic encephalopathy: K76.82

## 2024-03-30 LAB — COMPREHENSIVE METABOLIC PANEL WITH GFR
ALT: 64 U/L — ABNORMAL HIGH (ref 0–44)
ALT: 57 U/L — ABNORMAL HIGH (ref 0–44)
AST: 105 U/L — ABNORMAL HIGH (ref 15–41)
AST: 95 U/L — ABNORMAL HIGH (ref 15–41)
Albumin: 2.9 g/dL — ABNORMAL LOW (ref 3.5–5.0)
Albumin: 2.6 g/dL — ABNORMAL LOW (ref 3.5–5.0)
Alkaline Phosphatase: 154 U/L — ABNORMAL HIGH (ref 38–126)
Alkaline Phosphatase: 139 U/L — ABNORMAL HIGH (ref 38–126)
Anion gap: 8 (ref 5–15)
Anion gap: 7 (ref 5–15)
BUN: 29 mg/dL — ABNORMAL HIGH (ref 8–23)
BUN: 27 mg/dL — ABNORMAL HIGH (ref 8–23)
CO2: 25 mmol/L (ref 22–32)
CO2: 25 mmol/L (ref 22–32)
Calcium: 9.7 mg/dL (ref 8.9–10.3)
Calcium: 9.1 mg/dL (ref 8.9–10.3)
Chloride: 103 mmol/L (ref 98–111)
Chloride: 101 mmol/L (ref 98–111)
Creatinine, Ser: 1.02 mg/dL — ABNORMAL HIGH (ref 0.44–1.00)
Creatinine, Ser: 0.98 mg/dL (ref 0.44–1.00)
GFR, Estimated: 58 mL/min — ABNORMAL LOW (ref >60)
GFR, Estimated: >60 mL/min (ref >60)
Glucose, Bld: 79 mg/dL (ref 70–99)
Glucose, Bld: 87 mg/dL (ref 70–99)
Potassium: 6 mmol/L — ABNORMAL HIGH (ref 3.5–5.1)
Potassium: 6.3 mmol/L (ref 3.5–5.1)
Sodium: 136 mmol/L (ref 135–145)
Sodium: 133 mmol/L — ABNORMAL LOW (ref 135–145)
Total Bilirubin: 1.1 mg/dL (ref 0.0–1.2)
Total Bilirubin: 1.2 mg/dL (ref 0.0–1.2)
Total Protein: 7.2 g/dL (ref 6.5–8.1)
Total Protein: 6.5 g/dL (ref 6.5–8.1)

## 2024-03-30 LAB — CBC WITH DIFFERENTIAL/PLATELET
Abs Immature Granulocytes: 0.02 10*3/uL (ref 0.00–0.07)
Basophils Absolute: 0 10*3/uL (ref 0.0–0.1)
Basophils Relative: 1 %
Eosinophils Absolute: 0.1 10*3/uL (ref 0.0–0.5)
Eosinophils Relative: 2 %
HCT: 30.4 % — ABNORMAL LOW (ref 36.0–46.0)
Hemoglobin: 9.9 g/dL — ABNORMAL LOW (ref 12.0–15.0)
Immature Granulocytes: 1 %
Lymphocytes Relative: 22 %
Lymphs Abs: 1 10*3/uL (ref 0.7–4.0)
MCH: 33.7 pg (ref 26.0–34.0)
MCHC: 32.6 g/dL (ref 30.0–36.0)
MCV: 103.4 fL — ABNORMAL HIGH (ref 80.0–100.0)
Monocytes Absolute: 0.4 10*3/uL (ref 0.1–1.0)
Monocytes Relative: 10 %
Neutro Abs: 2.9 10*3/uL (ref 1.7–7.7)
Neutrophils Relative %: 64 %
Platelets: 135 10*3/uL — ABNORMAL LOW (ref 150–400)
RBC: 2.94 MIL/uL — ABNORMAL LOW (ref 3.87–5.11)
RDW: 16.8 % — ABNORMAL HIGH (ref 11.5–15.5)
WBC: 4.4 10*3/uL (ref 4.0–10.5)
nRBC: 0 % (ref 0.0–0.2)

## 2024-03-30 LAB — MRSA NEXT GEN BY PCR, NASAL: MRSA by PCR Next Gen: NOT DETECTED

## 2024-03-30 LAB — CBG MONITORING, ED: Glucose-Capillary: 77 mg/dL (ref 70–99)

## 2024-03-30 LAB — TSH: TSH: 4.123 u[IU]/mL (ref 0.350–4.500)

## 2024-03-30 LAB — PROTIME-INR
INR: 1.4 — ABNORMAL HIGH (ref 0.8–1.2)
Prothrombin Time: 17.5 s — ABNORMAL HIGH (ref 11.4–15.2)

## 2024-03-30 LAB — I-STAT CG4 LACTIC ACID, ED: Lactic Acid, Venous: 0.9 mmol/L (ref 0.5–1.9)

## 2024-03-30 LAB — AMMONIA: Ammonia: 107 umol/L — ABNORMAL HIGH (ref 9–35)

## 2024-03-30 MED ORDER — BISACODYL 5 MG PO TBEC: 5.0000 mg | DELAYED_RELEASE_TABLET | Freq: Every day | ORAL | Status: DC | PRN

## 2024-03-30 MED ORDER — SODIUM CHLORIDE 0.9 % IV BOLUS (SEPSIS)
1000.0000 mL | Freq: Once | INTRAVENOUS | Status: AC
  Administered 2024-03-30: 1000 mL via INTRAVENOUS

## 2024-03-30 MED ORDER — OXYCODONE HCL 5 MG PO TABS: 5.0000 mg | ORAL_TABLET | ORAL | Status: DC | PRN

## 2024-03-30 MED ORDER — SODIUM ZIRCONIUM CYCLOSILICATE 10 G PO PACK
10.0000 g | PACK | Freq: Once | ORAL | Status: AC
  Administered 2024-03-30: 10 g via ORAL
  Filled 2024-03-30: qty 1

## 2024-03-30 MED ORDER — ACETAMINOPHEN 325 MG PO TABS: 650.0000 mg | ORAL_TABLET | Freq: Four times a day (QID) | ORAL | Status: DC | PRN

## 2024-03-30 MED ORDER — ALBUTEROL SULFATE (2.5 MG/3ML) 0.083% IN NEBU: 2.5000 mg | INHALATION_SOLUTION | Freq: Four times a day (QID) | RESPIRATORY_TRACT | Status: DC | PRN

## 2024-03-30 MED ORDER — LACTULOSE 10 GM/15ML PO SOLN
30.0000 g | Freq: Once | ORAL | Status: AC
  Administered 2024-03-30: 30 g via ORAL
  Filled 2024-03-30: qty 60

## 2024-03-30 MED ORDER — CHLORHEXIDINE GLUCONATE CLOTH 2 % EX PADS
6.0000 | MEDICATED_PAD | Freq: Every day | CUTANEOUS | Status: DC
  Administered 2024-03-31: 6 via TOPICAL

## 2024-03-30 MED ORDER — CALCIUM GLUCONATE-NACL 1-0.675 GM/50ML-% IV SOLN
1.0000 g | Freq: Once | INTRAVENOUS | Status: AC
  Administered 2024-03-30: 1000 mg via INTRAVENOUS
  Filled 2024-03-30: qty 50

## 2024-03-30 MED ORDER — RIFAXIMIN 550 MG PO TABS
550.0000 mg | ORAL_TABLET | Freq: Two times a day (BID) | ORAL | Status: DC
  Administered 2024-03-30 – 2024-04-05 (×9): 550 mg via ORAL
  Filled 2024-03-30 (×11): qty 1

## 2024-03-30 MED ORDER — DEXTROSE 50 % IV SOLN
25.0000 mL | Freq: Once | INTRAVENOUS | Status: DC
  Filled 2024-03-30: qty 50

## 2024-03-30 MED ORDER — SODIUM ZIRCONIUM CYCLOSILICATE 10 G PO PACK
10.0000 g | PACK | Freq: Every day | ORAL | Status: DC
  Administered 2024-03-30: 10 g via ORAL
  Filled 2024-03-30 (×2): qty 1

## 2024-03-30 MED ORDER — LACTULOSE ENEMA: 300.0000 mL | Freq: Once | ORAL | Status: DC

## 2024-03-30 MED ORDER — ACETAMINOPHEN 650 MG RE SUPP: 650.0000 mg | Freq: Four times a day (QID) | RECTAL | Status: DC | PRN

## 2024-03-30 MED ORDER — INSULIN ASPART 100 UNIT/ML IJ SOLN
5.0000 [IU] | Freq: Once | INTRAMUSCULAR | Status: AC
  Administered 2024-03-30: 5 [IU] via INTRAVENOUS
  Filled 2024-03-30: qty 0.05

## 2024-03-30 MED ORDER — HYDRALAZINE HCL 20 MG/ML IJ SOLN: 10.0000 mg | Freq: Four times a day (QID) | INTRAMUSCULAR | Status: DC | PRN

## 2024-03-30 MED ORDER — SODIUM BICARBONATE 8.4 % IV SOLN
100.0000 meq | Freq: Once | INTRAVENOUS | Status: AC
  Administered 2024-03-30: 100 meq via INTRAVENOUS
  Filled 2024-03-30: qty 50

## 2024-03-30 MED ORDER — POLYETHYLENE GLYCOL 3350 17 G PO PACK: 17.0000 g | PACK | Freq: Every day | ORAL | Status: DC | PRN

## 2024-03-30 MED ORDER — DEXTROSE 50 % IV SOLN
1.0000 | Freq: Once | INTRAVENOUS | Status: AC
Start: 2024-03-30 — End: 2024-03-30
  Administered 2024-03-30: 50 mL via INTRAVENOUS
  Filled 2024-03-30: qty 50

## 2024-03-30 NOTE — ED Notes (Signed)
Gave report to Patrick RN.

## 2024-03-30 NOTE — H&P (Addendum)
 [facility] History and Physical  [name] WUJ:[id number] DOB: [birth year] DOA: 03/30/2024 PCP: Dorothe Gaster, NP  Presented from: Home Chief Complaint: Altered mental status  History of Present Illness: [name] is a 73 y.o. female with PMH significant for  cecal cancer status post hemicolectomy, chemo which induced liver cirrhosis with portal hypertension, EVs s/p ligation, s/p TIPS 2012, prior hepatic encephalopathy, thrombocytopenia. Patient was brought to the ED today for worsening mental status.  Patient follows up with hepatologist at [facility].  At baseline, she is functional, ambulates with a cane and mental status intact. Patient lives at home, taken care of by her husband. Patient is on lactulose  3 times daily at home.  Whenever her confusion worsens, husband increases the dose of lactulose  and also gives Xifaxan  for few days.  5/3, patient started to act confused and hence was given increased dose of lactulose  and Xifaxan .  She had all small stool and none for 4 days after that.  In the interval, patient has been very confused, disoriented and sleepy.  Her somnolence worsened today and hence she was brought to the ED.  In the ED, patient was hypothermic at 93.8, heart rate in 60s, blood pressure in 110s, breathing on room air. Labs with WBC count 4.4, hemoglobin 9.9, MCV 103, platelet 135, potassium elevated to 6, BUN/creatinine 29/1.02, LFTs mildly elevated, ammonia elevated to 107, INR at 1.4 EKG with normal sinus rhythm at 65 bpm, PR prolonged to 220 ms, QTc Blood culture was sent  She was started on Bair hugger for hypothermia. For hyperkalemia, she was given dextrose  and insulin .  She was also ordered for [facility] and lactulose  but they have not given by the time of repeat blood draw which showed potassium further up at 6.3. Lokelma and lactulose  were given after that. Hospitalist service was consulted for inpatient management.  At the time of my evaluation,  patient remains on [employer]  She was somnolent, overnight, but fell right back to sleep. Husband was at bedside and helped with most of the history. Denies any fever, urinary symptoms at home. Full CODE STATUS  Review of Systems:  All systems were reviewed and were negative unless otherwise mentioned in the HPI   Past medical history: Past Medical History:  Diagnosis Date   Acute urinary retention 05/04/2022   Allergy 2006 ?   Contrast dye   Arthritis 2016 ??   Knees and thumb   Cancer (HCC)    cecum   Cataract 2021   Surgery scheduled July 2023   Colon cancer [facility]) 2003   Elevated liver function tests    Esophageal varices (HCC)    Heart murmur On file   Hemorrhage of gastrointestinal tract 05/04/2011   Hypertension 2021   Hypothyroidism    Iron deficiency anemia    Liver disease    chemotherapy complication, per pt, shunts placed to bypass liver   Malignant neoplasm of cecum (HCC)    Portal hypertension (HCC)    Skin cancer 2019   Splenomegaly     Past surgical history: Past Surgical History:  Procedure Laterality Date   COLON SURGERY  2004   Cancer   COSMETIC SURGERY  2021   Skin cancer   ESOPHAGEAL VARICE LIGATION     EYE SURGERY     HEMICOLECTOMY  01/08/2003   IR RADIOLOGIST EVAL & MGMT  12/20/2020   IR RADIOLOGIST EVAL & MGMT  05/29/2021   LIVER SURGERY     shunts placed after  chemo complication   SKIN FULL THICKNESS GRAFT N/A 09/12/2019   Procedure: debridement and FTSG to the nose from left upper arm;  Surgeon: Barb Bonito, MD;  Location: [facility];  Service: Plastics;  Laterality: N/A;  2 hours, please   TIPS PROCEDURE      Social History:  reports that she has never smoked. She has never used smokeless tobacco. She reports that she does not drink alcohol and does not use drugs.  Allergies:  Allergies  Allergen Reactions   Contrast Media [Iodinated Contrast Media] Hives   Contrast media [iodinated contrast  media]   Family history:  Family History  Problem Relation Age of Onset   Arthritis Mother    Hearing loss Mother    Heart disease Mother    Hypertension Mother    Miscarriages / [place] Mother    Arthritis Father    Diabetes Father    Heart disease Father    Cancer Maternal Aunt    Breast cancer Neg Hx    Colon cancer Neg Hx    Esophageal cancer Neg Hx    Pancreatic cancer Neg Hx    Stomach cancer Neg Hx    Stroke Neg Hx      Physical Exam: Vitals:   03/30/24 1448 03/30/24 1603 03/30/24 1809 03/30/24 1930  BP: (!) 112/57  (!) 127/56   Pulse: 72  64   Resp: 18  16   Temp:  (!) 93.8 F (34.3 C)  (!) 97.3 F (36.3 C)  TempSrc:  Rectal    SpO2: 98%  96%   Weight:      Height:       Wt Readings from Last 3 Encounters:  03/30/24 65.8 kg  03/27/24 65.8 kg  03/16/24 68.9 kg   Body mass index is 24.89 kg/m.  General exam: Pleasant, elderly Caucasian female.  Not in pain Skin: No rashes, lesions or ulcers. HEENT: Atraumatic, normocephalic, no obvious bleeding Lungs: Clear to auscultation bilaterally,  CVS: S1, S2, no murmur,   GI/Abd: Soft, nontender, nondistended, bowel sound present,   CNS: Somnolent, opens eyes on command but falls right back asleep.  Able to follow commands when awake.  No focal deficit Psychiatry: Sad affect Extremities: No pedal edema, no calf tenderness,    ----------------------------------------------------------------------------------------------------------------------------------------- ----------------------------------------------------------------------------------------------------------------------------------------- -----------------------------------------------------------------------------------------------------------------------------------------  Assessment/Plan: Acute hepatic encephalopathy Presented with progressive confusion, somnolence and no bowel movement last 4 days Ammonia level significant elevated to over  100. Lactulose  and rifaximin  oral ordered.  Repeat ammonia in the morning Continue to monitor mental status change Recent Labs  Lab 03/30/24 1515  AMMONIA 107*   Acute hypERkalemia Potassium significantly elevated to 6.  No EKG changes Likely secondary to Aldactone . Given calcium bicarb earlier.  Repeat potassium higher at 6.3.  Likely gentle Lokelma given after that.  I also added for sodium bicarb and another dose of Lokelma. Repeat BMP ordered for midnight. Recent Labs  Lab 03/30/24 1515 03/30/24 1850  K 6.0* 6.3*   Hypothermia - r/o infection Initial temperature was very low at 90.8.  Currently on Bair other.  Gradually improved temperature.   WBC count normal, lactic acid level normal.  No abdominal tenderness at all to suspect SBP. Denies urinary symptoms. Blood culture was sent.  Continue to monitor Recent Labs  Lab 03/30/24 1515 03/30/24 1947  WBC 4.4  --   LATICACIDVEN  --  0.9   H/o liver cirrhosis with portal hypertension s/p TIPS 2012 PTA meds-Aldactone  50 mg daily, Lasix  20 mg  daily and additional 20 mg daily as needed Lactulose  and Xifaxan  as above Does not seem to be in beta-blocker probably because of labile blood pressure For tonight, I would hold Lasix  and Aldactone  both.  Chronic macrocytic anemia  EVs s/p ligation Baseline hemoglobin more than 10.  Continue to monitor Recent Labs    04/25/23 0410 04/26/23 0423 12/22/23 0500 12/23/23 0546 12/24/23 0524 01/06/24 1117 03/30/24 1515  HGB 9.5*   < > 11.2* 11.3* 11.4* 11.2* 9.9*  MCV 104.7*   < > 100.3* 103.3* 102.4* 102.6* 103.4*  VITAMINB12 1,396*  --   --   --   --   --   --   FOLATE 14.5  --   --   --   --   --   --    < > = values in this interval not displayed.   Thrombocytopenia Platelet low but stable.  Continue to monitor Recent Labs  Lab 03/30/24 1515  PLT 135*    H/o cecal cancer s/p hemicolectomy 2003 Currently in remission  Impaired mobility Uses cane at baseline May  benefit from PT eval once improves clinically  Goals of care:   Code Status: Full Code    DVT prophylaxis:  SCDs Start: 03/30/24 1839   Antimicrobials: None currently Fluid: Not on IV fluid.  Encourage oral intake Consultants: None Family Communication: Husband at bedside  Status: Inpatient Level of care:  Progressive   Patient is from: Home Anticipated d/c to: Hopefully home in 2 to 3 days  Diet:  Diet Order             Diet 2 gram sodium Room service appropriate? Yes; Fluid consistency: Thin  Diet effective now                    ------------------------------------------------------------------------------------- Severity of Illness: The appropriate patient status for this patient is INPATIENT. Inpatient status is judged to be reasonable and necessary in order to provide the required intensity of service to ensure the patient's safety. The patient's presenting symptoms, physical exam findings, and initial radiographic and laboratory data in the context of their chronic comorbidities is felt to place them at high risk for further clinical deterioration. Furthermore, it is not anticipated that the patient will be medically stable for discharge from the hospital within 2 midnights of admission.   * I certify that at the point of admission it is my clinical judgment that the patient will require inpatient hospital care spanning beyond 2 midnights from the point of admission due to high intensity of service, high risk for further deterioration and high frequency of surveillance required.* -------------------------------------------------------------------------------------   Home Meds: Prior to Admission medications   Medication Sig Start Date End Date Taking? Authorizing Provider  aspirin  EC 81 MG tablet Take 1 tablet (81 mg total) by mouth daily. Swallow whole. 12/26/23  Yes Wynetta Heckle, MD  CRANBERRY PO Take 1 tablet by mouth 2 (two) times daily.   Yes [provider]  furosemide  (LASIX ) 20 MG tablet Take 20-40 mg by mouth See admin instructions. Take blood pressure and depending on reading take 20mg  or 40mg  by mouth daily.   Yes [provider]  lactulose  (CHRONULAC ) 10 GM/15ML solution Take 45 mLs (30 g total) by mouth 3 (three) times daily. 03/13/23  Yes Pokhrel, Laxman, MD  levOCARNitine  (CARNITOR ) 330 MG tablet Take 330 mg by mouth 3 (three) times daily. 05/23/22  Yes [provider]  levothyroxine  (SYNTHROID ) 25 MCG tablet  Take 25 mcg by mouth daily before breakfast.   Yes [provider]  liver oil-zinc  oxide (DESITIN) 40 % ointment Apply topically daily as needed for irritation. 03/13/23  Yes Pokhrel, Laxman, MD  Multiple Vitamin (MULTI-VITAMIN) tablet Take 1 tablet by mouth daily.   Yes [provider]  polyethylene glycol (MIRALAX  / GLYCOLAX ) 17 g packet Take 17 g by mouth daily as needed for mild constipation. 03/13/23  Yes Pokhrel, Laxman, MD  rifaximin  (XIFAXAN ) 550 MG TABS tablet Take 1 tablet (550 mg total) by mouth 2 (two) times daily. Patient taking differently: Take 550 mg by mouth as needed. Flareups 12/25/23  Yes Wynetta Heckle, MD  spironolactone  (ALDACTONE ) 25 MG tablet Take 50 mg by mouth daily.   Yes [provider]    Labs on Admission:   CBC: Recent Labs  Lab 03/30/24 1515  WBC 4.4  NEUTROABS 2.9  HGB 9.9*  HCT 30.4*  MCV 103.4*  PLT 135*    Basic Metabolic Panel: Recent Labs  Lab 03/30/24 1515 03/30/24 1850  NA 136 133*  K 6.0* 6.3*  CL 103 101  CO2 25 25  GLUCOSE 79 87  BUN 29* 27*  CREATININE 1.02* 0.98  CALCIUM 9.7 9.1    Liver Function Tests: Recent Labs  Lab 03/30/24 1515 03/30/24 1850  AST 105* 95*  ALT 64* 57*  ALKPHOS 154* 139*  BILITOT 1.1 1.2  PROT 7.2 6.5  ALBUMIN  2.9* 2.6*   No results for input(s): "LIPASE", "AMYLASE" in the last 168 hours. Recent Labs  Lab 03/30/24 1515  AMMONIA 107*    Cardiac Enzymes: No results for input(s):  "CKTOTAL", "CKMB", "CKMBINDEX", "TROPONINI" in the last 168 hours.  BNP (last 3 results) No results for input(s): "BNP" in the last 8760 hours.  ProBNP (last 3 results) Recent Labs    08/10/23 0946  PROBNP 45.0    CBG: Recent Labs  Lab 03/30/24 1606  GLUCAP 77    Lipase     Component Value Date/Time   LIPASE 37 12/21/2023 0855     Urinalysis    Component Value Date/Time   COLORURINE YELLOW 12/21/2023 1208   APPEARANCEUR CLEAR 12/21/2023 1208   LABSPEC 1.017 12/21/2023 1208   PHURINE 7.0 12/21/2023 1208   GLUCOSEU NEGATIVE 12/21/2023 1208   GLUCOSEU NEGATIVE 08/04/2023 0916   HGBUR NEGATIVE 12/21/2023 1208   BILIRUBINUR NEGATIVE 12/21/2023 1208   BILIRUBINUR negative 12/06/2023 1117   BILIRUBINUR Negative 02/04/2023 0917   KETONESUR NEGATIVE 12/21/2023 1208   PROTEINUR NEGATIVE 12/21/2023 1208   UROBILINOGEN 0.2 12/06/2023 1117   UROBILINOGEN 0.2 08/04/2023 0916   NITRITE NEGATIVE 12/21/2023 1208   LEUKOCYTESUR TRACE (A) 12/21/2023 1208     Drugs of Abuse  No results found for: "LABOPIA", "COCAINSCRNUR", "LABBENZ", "AMPHETMU", "THCU", "LABBARB"    Radiological Exams on Admission: No results found.   Signed, Hoyt Macleod, MD [facility] 03/30/2024

## 2024-03-30 NOTE — ED Notes (Signed)
 Pt was placed on heating blanket per MD

## 2024-03-30 NOTE — ED Triage Notes (Signed)
 Pt to er with husband/caretaker, he states that pt is here for some confusion, states that he believes that she needs to have a bm, states that he has increased her lactulose .  States that she has some liver problems post chemo many years ago. States that they have been having some encephalopathy since jan 2022

## 2024-03-30 NOTE — ED Provider Notes (Signed)
 [place] EMERGENCY DEPARTMENT AT [facility] Provider Note   CSN: [id number] Arrival date & time: 03/30/24  1410     History  Chief Complaint  Patient presents with   Altered Mental Status    Aviyah Desso is a 73 y.o. female.  HPI    73 year old female with a history of cecal adenocarcinoma s/p resection and chemotherapy in 2004, cirrhosis with portal hypertension, history of hepatic encephalopathy, TIPS in 2012, esophageal varices status post ligation, hypertension, CVA who presents with concern for altered mental status.  Saturday night began having some confusion Sunday increased lactulose  to 4 teaspoons Xifaxan  started Sunday When she starts acting confused, will increase lactulose , start xiafaxan, hydrate her Prune juice Small stool since then, has not had any significant BM for the last 4 days but did have one after she arrived to the ED Sometimes does take more than one BM, sometimes overnight Has been sleepy, more confused.  Got 2 different outfits out to change into, couldn't remember where her toothbrush was, has been a little disoriented. After breakfast today, fell back to sleep, then when she woke up wanted to go to sleep, progressively worse. Would have brought her here yesterday but he had chemo yesterday Temp was 95 typically 96.8 today, has not been lower than that No focal weakness/numbness, no drooping, no known change in vision, described seeing some things This morning had some abdominal pain, has had the constipation. No nausea or vomiting. No pain with urination. No falls since March 8 last year.   Patient reports that she feels fatigued, sleepiness, and denies any other acute concerns.  Denies cough, abdominal pain.  Has not had black or bloody stools.  Did not have any stools until arrival to the emergency department.  Past Medical History:  Diagnosis Date   Acute urinary retention 05/04/2022   Allergy 2006 ?   Contrast dye   Arthritis  2016 ??   Knees and thumb   Cancer [facility])    cecum   Cataract 2021   Surgery scheduled July 2023   Colon cancer [facility]) 2003   Elevated liver function tests    Esophageal varices (HCC)    Heart murmur On file   Hemorrhage of gastrointestinal tract 05/04/2011   Hypertension 2021   Hypothyroidism    Iron deficiency anemia    Liver disease    chemotherapy complication, per pt, shunts placed to bypass liver   Malignant neoplasm of cecum (HCC)    Portal hypertension (HCC)    Skin cancer 2019   Splenomegaly      Home Medications Prior to Admission medications   Medication Sig Start Date End Date Taking? Authorizing Provider  aspirin  EC 81 MG tablet Take 1 tablet (81 mg total) by mouth daily. Swallow whole. 12/26/23  Yes Wynetta Heckle, MD  CRANBERRY PO Take 1 tablet by mouth 2 (two) times daily.   Yes [provider]  furosemide  (LASIX ) 20 MG tablet Take 20-40 mg by mouth See admin instructions. Take blood pressure and depending on reading take 20mg  or 40mg  by mouth daily.   Yes [provider]  lactulose  (CHRONULAC ) 10 GM/15ML solution Take 45 mLs (30 g total) by mouth 3 (three) times daily. 03/13/23  Yes Pokhrel, Laxman, MD  levOCARNitine  (CARNITOR ) 330 MG tablet Take 330 mg by mouth 3 (three) times daily. 05/23/22  Yes [provider]  levothyroxine  (SYNTHROID ) 25 MCG tablet Take 25 mcg by mouth daily before breakfast.   Yes [provider]  liver oil-zinc  oxide (DESITIN) 40 % ointment Apply topically daily as needed for irritation. 03/13/23  Yes Pokhrel, Laxman, MD  Multiple Vitamin (MULTI-VITAMIN) tablet Take 1 tablet by mouth daily.   Yes [provider]  polyethylene glycol (MIRALAX  / GLYCOLAX ) 17 g packet Take 17 g by mouth daily as needed for mild constipation. 03/13/23  Yes Pokhrel, Laxman, MD  rifaximin  (XIFAXAN ) 550 MG TABS tablet Take 1 tablet (550 mg total) by mouth 2 (two) times daily. Patient taking differently: Take 550 mg by mouth as  needed. Flareups 12/25/23  Yes Wynetta Heckle, MD  spironolactone  (ALDACTONE ) 25 MG tablet Take 50 mg by mouth daily.   Yes [provider]      Allergies    Contrast media [iodinated contrast media]    Review of Systems   Review of Systems  Physical Exam Updated Vital Signs BP (!) 91/40   Pulse 87   Temp 97.8 F (36.6 C) (Axillary)   Resp 14   Ht 5\' 5"  (1.651 m)   Wt 65.9 kg   SpO2 99%   BMI 24.18 kg/m  Physical Exam Vitals and nursing note reviewed.  Constitutional:      General: She is not in acute distress.    Appearance: Normal appearance. She is well-developed. She is not ill-appearing or diaphoretic.     Comments: Sleepy but answering questions when stimulated  HENT:     Head: Normocephalic and atraumatic.  Eyes:     General: No visual field deficit.    Extraocular Movements: Extraocular movements intact.     Conjunctiva/sclera: Conjunctivae normal.     Pupils: Pupils are equal, round, and reactive to light.  Cardiovascular:     Rate and Rhythm: Normal rate and regular rhythm.     Pulses: Normal pulses.     Heart sounds: Normal heart sounds. No murmur heard.    No friction rub. No gallop.  Pulmonary:     Effort: Pulmonary effort is normal. No respiratory distress.     Breath sounds: Normal breath sounds. No wheezing or rales.  Abdominal:     General: There is no distension.     Palpations: Abdomen is soft.     Tenderness: There is no abdominal tenderness. There is no guarding.  Musculoskeletal:        General: No swelling or tenderness.     Cervical back: Normal range of motion.  Skin:    General: Skin is warm and dry.     Findings: No erythema or rash.  Neurological:     General: No focal deficit present.     Mental Status: She is alert.     GCS: GCS eye subscore is 4. GCS verbal subscore is 5. GCS motor subscore is 6.     Cranial Nerves: No cranial nerve deficit, dysarthria or facial asymmetry.     Sensory: No sensory deficit.     Motor: No  weakness or tremor.     Coordination: Coordination normal. Finger-Nose-Finger Test normal.     Gait: Gait normal.     Comments: [employer]ix[employer]      ED Results / Procedures / Treatments   Labs (all labs ordered are listed, but only abnormal results are displayed) Labs Reviewed  CBC WITH DIFFERENTIAL/PLATELET - Abnormal; Notable for the following components:      Result Value   RBC 2.94 (*)    Hemoglobin 9.9 (*)    HCT 30.4 (*)    MCV 103.4 (*)  RDW 16.8 (*)    Platelets 135 (*)    All other components within normal limits  COMPREHENSIVE METABOLIC PANEL WITH GFR - Abnormal; Notable for the following components:   Potassium 6.0 (*)    BUN 29 (*)    Creatinine, Ser 1.02 (*)    Albumin  2.9 (*)    AST 105 (*)    ALT 64 (*)    Alkaline Phosphatase 154 (*)    GFR, Estimated 58 (*)    All other components within normal limits  AMMONIA - Abnormal; Notable for the following components:   Ammonia 107 (*)    All other components within normal limits  PROTIME-INR - Abnormal; Notable for the following components:   Prothrombin Time 17.5 (*)    INR 1.4 (*)    All other components within normal limits  COMPREHENSIVE METABOLIC PANEL WITH GFR - Abnormal; Notable for the following components:   Sodium 133 (*)    Potassium 6.3 (*)    BUN 27 (*)    Albumin  2.6 (*)    AST 95 (*)    ALT 57 (*)    Alkaline Phosphatase 139 (*)    All other components within normal limits  CBC - Abnormal; Notable for the following components:   WBC 3.0 (*)    RBC 2.50 (*)    Hemoglobin 8.5 (*)    HCT 27.0 (*)    MCV 108.0 (*)    RDW 16.8 (*)    Platelets 107 (*)    All other components within normal limits  AMMONIA - Abnormal; Notable for the following components:   Ammonia 53 (*)    All other components within normal limits  MAGNESIUM  - Abnormal; Notable for the following components:   Magnesium  1.4 (*)    All other components within normal limits  BASIC METABOLIC PANEL WITH GFR - Abnormal; Notable  for the following components:   Glucose, Bld 68 (*)    BUN 27 (*)    Creatinine, Ser 1.15 (*)    GFR, Estimated 51 (*)    All other components within normal limits  MRSA NEXT GEN BY PCR, NASAL  CULTURE, BLOOD (ROUTINE X 2)  CULTURE, BLOOD (ROUTINE X 2)  TSH  PHOSPHORUS  URINALYSIS, ROUTINE W REFLEX MICROSCOPIC  COMPREHENSIVE METABOLIC PANEL WITH GFR  CBG MONITORING, ED  I-STAT CG4 LACTIC ACID, ED    EKG EKG Interpretation Date/Time:  Thursday Mar 30 2024 18:44:13 EDT Ventricular Rate:  65 PR Interval:  220 QRS Duration:  102 QT Interval:  421 QTC Calculation: 438 R Axis:   18  Text Interpretation: Sinus rhythm Prolonged PR interval Inferior infarct, age indeterminate Minimal ST elevation, anterior leads Lateral leads are also involved Since prior ECG, prominent TW in V2 instead of V3, no significant changes . artifact Confirmed by Scarlette Currier ([id number]) on 03/31/2024 1:01:31 AM  Radiology No results found.  Procedures Procedures    Medications Ordered in ED Medications  rifaximin  (XIFAXAN ) tablet 550 mg (550 mg Oral Given 03/30/24 1949)  sodium zirconium cyclosilicate  (LOKELMA ) packet 10 g (10 g Oral Given 03/30/24 1948)  acetaminophen  (TYLENOL ) tablet 650 mg (has no administration in time range)    Or  acetaminophen  (TYLENOL ) suppository 650 mg (has no administration in time range)  polyethylene glycol (MIRALAX  / GLYCOLAX ) packet 17 g (has no administration in time range)  bisacodyl  (DULCOLAX) EC tablet 5 mg (has no administration in time range)  albuterol  (PROVENTIL ) (2.5 MG/3ML) 0.083% nebulizer solution 2.5 mg (has no  administration in time range)  hydrALAZINE  (APRESOLINE ) injection 10 mg (has no administration in time range)  oxyCODONE  (Oxy IR/ROXICODONE ) immediate release tablet 5 mg (has no administration in time range)  Chlorhexidine  Gluconate Cloth 2 % PADS 6 each (has no administration in time range)  lactulose  (CHRONULAC ) 10 GM/15ML solution 30 g (30 g Oral  Given 03/30/24 1949)  insulin  aspart (novoLOG ) injection 5 Units (5 Units Intravenous Given 03/30/24 1949)  dextrose  50 % solution 50 mL (50 mLs Intravenous Given 03/30/24 1948)  sodium chloride  0.9 % bolus 1,000 mL (0 mLs Intravenous Stopped 03/30/24 2050)  calcium  gluconate 1 g/ 50 mL sodium chloride  IVPB (0 mg Intravenous Stopped 03/30/24 2050)  sodium bicarbonate  injection 100 mEq (100 mEq Intravenous Given 03/30/24 2025)  sodium zirconium cyclosilicate  (LOKELMA ) packet 10 g (10 g Oral Given 03/30/24 2228)    ED Course/ Medical Decision Making/ A&P                                   73 year old female with a history of cecal adenocarcinoma s/p resection and chemotherapy in 2004, cirrhosis with portal hypertension, history of hepatic encephalopathy, TIPS in 2012, esophageal varices status post ligation, hypertension, CVA who presents with concern for altered mental status.  DDx includes hepatic encephalopathy, infection, CVA, ICH, toxic or other metabolic encephalopathy.  EKG completed and does show some artifact but does not show significant arrhythmia or acute ST changes.  Labs completed and personally evaluated interpreted by me show hemoglobin 9.9, decreased from 11.2 in February.  Patient has been denies acute bleeding symptoms.  CMP shows a potassium of 6, with BUN increased to 29 and creatinine of 1.02 from previous of 0.63 in January, 0.93 in February.  Her transaminases are mildly elevated.  For her potassium, ordered Lokelma , insulin  and dextrose .  She does not have overall peaked T waves suggest changes of hyperkalemia.  Will recheck her CMP to make sure this is not a lab abnormality/hemolysis.  Her ammonia is 107. Suspect hepatic encephalopathy as etiology of symptoms.  Ordered lactulose .    In the setting of her hypothermia, also consider infection as possible etiology.  Ordered blood cultures, lactate and In-N-Out catheterization for urine in addition to TSH.  Urine is pending at time  of admission.  Will admit to the hospital with concern for hepatic encephalopathy, hyperkalemia, and hypothermia.          Final Clinical Impression(s) / ED Diagnoses Final diagnoses:  Hepatic encephalopathy (HCC)  Hypothermia, initial encounter  Hyperkalemia    Rx / DC Orders ED Discharge Orders     None         Scarlette Currier, MD 03/31/24 0107

## 2024-03-30 NOTE — ED Provider Triage Note (Signed)
 Emergency Medicine Provider Triage Evaluation Note  [name] , a 73 y.o. female  was evaluated in triage.  Pt complains of encephalopathy, recurrent problem. On Lactulose  3T maintence, now 4T TID without significant stool. Also ciphaxen QAM and at bedtime, miralax  starting 4 days ago. Here with husband who provides history, increased meds 4 days ago, hard to say exactly when symptoms started. TIA x 2 in December/January. Friday was a little off but ok Saturday, off again on Sunday and continued to Monday which prompted med increase. PO intake ok, able to feed self, slight difficulty walking and has been using a walker/cane.  No falls. Denies unilateral weakness or numbness. States she feels lousy. Alert to person, place, day.  Review of Systems  Positive:  Negative:   Physical Exam  BP (!) 112/57 (BP Location: Left Arm)   Pulse 72   Resp 18   Ht 5\' 4"  (1.626 m)   Wt 65.8 kg   SpO2 98%   BMI 24.89 kg/m  Gen:   Awake, no distress   Resp:  Normal effort  MSK:   Moves extremities without difficulty  Other:    Medical Decision Making  Medically screening exam initiated at 2:54 PM.  Appropriate orders placed.  Ayshah Alva was informed that the remainder of the evaluation will be completed by another provider, this initial triage assessment does not replace that evaluation, and the importance of remaining in the ED until their evaluation is complete.     Darlis Eisenmenger, PA-C 03/30/24 1459

## 2024-03-31 ENCOUNTER — Inpatient Hospital Stay (HOSPITAL_COMMUNITY)

## 2024-03-31 ENCOUNTER — Encounter (HOSPITAL_COMMUNITY): Payer: Self-pay | Admitting: Internal Medicine

## 2024-03-31 DIAGNOSIS — K7682 Hepatic encephalopathy: Secondary | ICD-10-CM | POA: Diagnosis not present

## 2024-03-31 DIAGNOSIS — K7290 Hepatic failure, unspecified without coma: Secondary | ICD-10-CM

## 2024-03-31 DIAGNOSIS — K7581 Nonalcoholic steatohepatitis (NASH): Secondary | ICD-10-CM

## 2024-03-31 DIAGNOSIS — D696 Thrombocytopenia, unspecified: Secondary | ICD-10-CM

## 2024-03-31 DIAGNOSIS — E875 Hyperkalemia: Secondary | ICD-10-CM

## 2024-03-31 LAB — COMPREHENSIVE METABOLIC PANEL WITH GFR
ALT: 53 U/L — ABNORMAL HIGH (ref 0–44)
AST: 83 U/L — ABNORMAL HIGH (ref 15–41)
Albumin: 2.4 g/dL — ABNORMAL LOW (ref 3.5–5.0)
Alkaline Phosphatase: 126 U/L (ref 38–126)
Anion gap: 8 (ref 5–15)
BUN: 27 mg/dL — ABNORMAL HIGH (ref 8–23)
CO2: 23 mmol/L (ref 22–32)
Calcium: 8.8 mg/dL — ABNORMAL LOW (ref 8.9–10.3)
Chloride: 105 mmol/L (ref 98–111)
Creatinine, Ser: 1.08 mg/dL — ABNORMAL HIGH (ref 0.44–1.00)
GFR, Estimated: 55 mL/min — ABNORMAL LOW (ref >60)
Glucose, Bld: 78 mg/dL (ref 70–99)
Potassium: 5.1 mmol/L (ref 3.5–5.1)
Sodium: 136 mmol/L (ref 135–145)
Total Bilirubin: 1.5 mg/dL — ABNORMAL HIGH (ref 0.0–1.2)
Total Protein: 6 g/dL — ABNORMAL LOW (ref 6.5–8.1)

## 2024-03-31 LAB — URINALYSIS, ROUTINE W REFLEX MICROSCOPIC
Bilirubin Urine: NEGATIVE
Glucose, UA: NEGATIVE mg/dL
Hgb urine dipstick: NEGATIVE
Ketones, ur: NEGATIVE mg/dL
Leukocytes,Ua: NEGATIVE
Nitrite: NEGATIVE
Protein, ur: NEGATIVE mg/dL
Specific Gravity, Urine: 1.01 (ref 1.005–1.030)
pH: 9 — ABNORMAL HIGH (ref 5.0–8.0)

## 2024-03-31 LAB — GLUCOSE, CAPILLARY
Glucose-Capillary: 75 mg/dL (ref 70–99)
Glucose-Capillary: 69 mg/dL — ABNORMAL LOW (ref 70–99)

## 2024-03-31 LAB — AMMONIA: Ammonia: 53 umol/L — ABNORMAL HIGH (ref 9–35)

## 2024-03-31 LAB — CBC
HCT: 27 % — ABNORMAL LOW (ref 36.0–46.0)
Hemoglobin: 8.5 g/dL — ABNORMAL LOW (ref 12.0–15.0)
MCH: 34 pg (ref 26.0–34.0)
MCHC: 31.5 g/dL (ref 30.0–36.0)
MCV: 108 fL — ABNORMAL HIGH (ref 80.0–100.0)
Platelets: 107 10*3/uL — ABNORMAL LOW (ref 150–400)
RBC: 2.5 MIL/uL — ABNORMAL LOW (ref 3.87–5.11)
RDW: 16.8 % — ABNORMAL HIGH (ref 11.5–15.5)
WBC: 3 10*3/uL — ABNORMAL LOW (ref 4.0–10.5)
nRBC: 0 % (ref 0.0–0.2)

## 2024-03-31 LAB — BASIC METABOLIC PANEL WITH GFR
Anion gap: 10 (ref 5–15)
BUN: 27 mg/dL — ABNORMAL HIGH (ref 8–23)
CO2: 22 mmol/L (ref 22–32)
Calcium: 9.1 mg/dL (ref 8.9–10.3)
Chloride: 107 mmol/L (ref 98–111)
Creatinine, Ser: 1.15 mg/dL — ABNORMAL HIGH (ref 0.44–1.00)
GFR, Estimated: 51 mL/min — ABNORMAL LOW (ref >60)
Glucose, Bld: 68 mg/dL — ABNORMAL LOW (ref 70–99)
Potassium: 4.9 mmol/L (ref 3.5–5.1)
Sodium: 139 mmol/L (ref 135–145)

## 2024-03-31 LAB — PHOSPHORUS: Phosphorus: 2.5 mg/dL (ref 2.5–4.6)

## 2024-03-31 LAB — MAGNESIUM: Magnesium: 1.4 mg/dL — ABNORMAL LOW (ref 1.7–2.4)

## 2024-03-31 MED ORDER — DEXTROSE 50 % IV SOLN
25.0000 mL | Freq: Once | INTRAVENOUS | Status: AC
  Administered 2024-04-01: 25 mL via INTRAVENOUS
  Filled 2024-03-31: qty 50

## 2024-03-31 MED ORDER — ORAL CARE MOUTH RINSE: 15.0000 mL | OROMUCOSAL | Status: DC | PRN

## 2024-03-31 MED ORDER — LACTULOSE ENEMA
300.0000 mL | Freq: Four times a day (QID) | ORAL | Status: DC
  Administered 2024-03-31 – 2024-04-01 (×6): 300 mL via RECTAL
  Filled 2024-03-31 (×13): qty 300

## 2024-03-31 NOTE — Consult Note (Addendum)
 Referring Provider: Dr. Magdalene School Primary Care Physician:  Dorothe Gaster, NP Primary Gastroenterologist:  Dr. Legrand Puma  Reason for Consultation: Hepatic encephalopathy  HPI: [name] is a 73 y.o. 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   She presented to the ED 03/30/2024 secondary to altered mental status. She developed confusion on Saturday 03/25/2024 and her husband increased Lactulose  from 3 to 4 TBS tid and started Xifaxan  500mg  bid without significant improvement. She presented to the ED 03/30/2024 for further evaluation for recurrent hepatic encephalopathy. Labs in the ED showed a WBC count of 4.4.  Hemoglobin 9.9 down from baseline Hg 11.2.  Hematocrit 30.4.  MCV 103.4.  Platelet 135.  Sodium 136.  Potassium 6.0.  Glucose 79.  BUN 29.  Creatinine 1.02.  Total bili 1.1.  Alk phos 154.  AST 105.  ALT 64.  Albumin  2.9.  Ammonia 1.7.  INR 1.4.  TSH 4.123. Repeat potassium level 6.3.  Blood cultures in process.  MELD 3.0: 16.  Hypothermic in the ED, required Bair hugger and her temperature stabilized.  Hyperkalemia was corrected after administration of Lokelma .  Labs today: WBC 3.0.  Hemoglobin 8.5.  Hematocrit 27.  Platelet 107.  Ammonia 53.  Sodium 136.  Potassium 5.1.  BUN 27.  Creatinine 1.08.  Total bili 1.5.  Alk phos 126.  AST 83.  ALT 53.  Albumin  2.4.  A GI consult was requested for further evaluation regarding hepatic encephalopathy.  Her husband stated patient was adherent with taking Lactulose  3 tablespoons daily at home and takes Xifaxan  PRN when she has signs of confusion (Xifaxan  not affordable to take  daily). She developed signs of confusion on Sat 5/3 and her husband increased Lactulose  to 4 tablespoons 3 times daily, restarted Xifaxan  550 mg twice daily and MiraLAX  once daily which is the typical regimen administered with increasing signs of hepatic encephalopathy. She went a few days without passing a bowel movement which is not unusual then passes a large BM. No bloody or black stools. No recent illnesses or surgeries. Her husband stated she has not exhibited any nausea, vomiting, abdominal pain or other signs of acute illness at home. While she was in the ED last night, she passed a large bowel movement and a second large bowel movement while she was in the ICU last night as reported by her RN. She is lethargic and less responsive today and unable to safely take oral medications. Last dose of oral Lactulose  was yesterday at 7:49 PM.  No history of ascites.  On Furosemide  20 or 40 mg daily depending upon her blood pressure and Spironolactone  50 mg daily.  Non-smoker.  No NSAID use.  No alcohol use.  She developed Oxaliplatin induced non-cirrhotic portal hypertension with esophageal varices and had a variceal hemorrhage in 2012 for which she underwent TIPS at [facility] Her most recent EGD was in 2010. Doppler ultrasound of the liver 11/2020 that questioned possible TIPS stenosis. She was seen by interventional radiology and decision was made not to attempt revision given new hepatic encephalopathy. Since then, she has had multiple hospitalizations for encephalopathy, including hospital admission 02/25/2023 through 03/13/2023 with encephalopathy, AKI, cystitis, and bradycardia with SVT. This hospitalization occurred after patient fell and broke  her left wrist and was given Oxycodone . She became constipated and encephalopathic. Treated with Lactulose  via NG tube and Xifaxan  in the hospital. UTI treated. Her most recent hospital admission with hepatic encephalopathy was 12/23/2023 which improved after  receiving Xifaxan  in conjunction with Lactulose . Brain MRI showed subacute infarcts, followed by neurology. Sono imaging showed in stent stenosis of her TIPS and in light of her neurological findings, TIPS interrogation/potential revision was deferred to the outpatient setting but was not pursued. She is followed by Duey Ghent NP at [facility]. Patient is a poor candidate for transplant given history of adenocarcinoma of the colon and advanced age. Last surveillance colonoscopy was in 2015. Paternal grandmother diagnosed with colon cancer in her 28s.  PAST IMAGE STUDIES:  ECHO 12/23/2023: Left ventricular ejection fraction, by estimation, is 60 to 65%. The left ventricle has normal function. The left ventricle has no regional wall motion abnormalities. Left ventricular diastolic parameters are consistent with Grade II diastolic dysfunction (pseudonormalization). Elevated left ventricular end-diastolic pressure. 1. Right ventricular systolic function is normal. The right ventricular size is moderately enlarged. There is mildly elevated pulmonary artery systolic pressure. 2. 3. Left atrial size was mildly dilated. The mitral valve is degenerative. Trivial mitral valve regurgitation. No evidence of mitral stenosis. Severe mitral annular calcification. 4. The aortic valve is tricuspid. Aortic valve regurgitation is not visualized. Aortic valve sclerosis/calcification is present, without any evidence of aortic stenosis. Aortic valve Vmax measures 1.78 m/s. 5. The inferior vena cava is dilated in size with >50% respiratory variability, suggesting right atrial pressure of 8 mmHg. 6. Evidence of atrial level shunting detected by color flow Doppler. Agitated saline contrast bubble study was positive with shunting observed within 3-6 cardiac cycles suggestive of interatrial shunt.  Brain MRI 12/22/2023: IMPRESSION: 1. Multiple small acute/early subacute infarcts in the left greater than right cerebral  hemispheres and right cerebellum. 2. Moderate chronic small vessel ischemic disease.  Limited abdominal ultrasound with liver Doppler 12/22/2023: IMPRESSION: TIPS is patent, however, abnormal waveforms and velocity suggesting developing in stent stenosis.   Cholelithiasis without sonographic evidence of acute cholecystitis.   The ultrasound documents varices of the upper abdomen, and negative for ascites.  CTAP 12/21/2023 with contrast:  FINDINGS: Lower chest: No pleural or pericardial effusion   Hepatobiliary: . TIPS stent stable position. No focal liver lesion or biliary ductal dilatation.   Pancreas: Unremarkable. No pancreatic ductal dilatation or surrounding inflammatory changes.   Spleen: Normal in size without focal abnormality.   Adrenals/Urinary Tract: Adrenal glands are unremarkable. Kidneys are normal, without renal calculi, focal lesion, or hydronephrosis. Bladder is unremarkable.   Stomach/Bowel: Stomach nondistended, unremarkable. Small bowel decompressed. Post right hemicolectomy. Anastomotic staple line right mid abdomen. Remaining colon is incompletely distended, unremarkable.   Vascular/Lymphatic: Mild scattered aortoiliac calcified plaque. No abdominal or pelvic adenopathy.   Reproductive: Uterus and bilateral adnexa are unremarkable.   Other: No abdominal ascites. No free air. Trace free fluid in the pelvis.   Musculoskeletal: New compression fracture deformity of L1 with approximately 50% loss of height anteriorly, with diffuse sclerosis, no significant retropulsion. Stable compression fracture deformities of T10, T12, L2, and L3.   IMPRESSION: 1. No acute findings. 2. New L1 compression fracture deformity. 3.  Aortic Atherosclerosis     Liver biopsy and colon resection path reports:  Liver biopsy 05/15/2008: Liver, NOS, biopsy  - Sinusoidal dilatation and congestion.  - Patchy central venous congestion.  - Focal, minimal portal chronic  inflammatory infiltrate consisting of  lymphocytes, plasma cells, and rare eosinophils (see comment).  - Minimal periportal fibrosis and rare portal to portal septa with intact  architecture (stage 2 fibrosis).  - No evidence of steatosis, hepatocellular necrosis, hepatocellular iron  deposition, or PAS/D positive intracellular globules.   Path report from colon resection 2004: COLON, SEGMENTAL RESECTION, RIGHT: MODERATELY DIFFERENTIATED  COLONIC-TYPE ADENOCARCINOMA ARISING IN REGION OF ILEOCECAL VALVE  WITH EXTENSION THROUGH FULL THICKNESS OF MUSCLE WALL.  LYMPH NODES: ONE OF SEVENTEEN LYMPH NODES WITH METASTATIC  CARCINOMA, SEE COMMENT.  Previously diagnosed as: Sixteen benign lymph nodes, no tumor  present.   Past Medical History:  Diagnosis Date   Acute urinary retention 05/04/2022   Allergy 2006 ?   Contrast dye   Arthritis 2016 ??   Knees and thumb   Cancer (HCC)    cecum   Cataract 2021   Surgery scheduled July 2023   Colon cancer [facility]) 2003   Elevated liver function tests    Esophageal varices (HCC)    Heart murmur On file   Hemorrhage of gastrointestinal tract 05/04/2011   Hypertension 2021   Hypothyroidism    Iron deficiency anemia    Liver disease    chemotherapy complication, per pt, shunts placed to bypass liver   Malignant neoplasm of cecum (HCC)    Portal hypertension (HCC)    Skin cancer 2019   Splenomegaly     Past Surgical History:  Procedure Laterality Date   COLON SURGERY  2004   Cancer   COSMETIC SURGERY  2021   Skin cancer   ESOPHAGEAL VARICE LIGATION     EYE SURGERY     HEMICOLECTOMY  01/08/2003   IR RADIOLOGIST EVAL & MGMT  12/20/2020   IR RADIOLOGIST EVAL & MGMT  05/29/2021   LIVER SURGERY     shunts placed after chemo complication   SKIN FULL THICKNESS GRAFT N/A 09/12/2019   Procedure: debridement and FTSG to the nose from left upper arm;  Surgeon: Barb Bonito, MD;  Location: [facility];  Service: Plastics;   Laterality: N/A;  2 hours, please   TIPS PROCEDURE      Prior to Admission medications   Medication Sig Start Date End Date Taking? Authorizing Provider  aspirin  EC 81 MG tablet Take 1 tablet (81 mg total) by mouth daily. Swallow whole. 12/26/23  Yes Wynetta Heckle, MD  CRANBERRY PO Take 1 tablet by mouth 2 (two) times daily.   Yes [provider]  furosemide  (LASIX ) 20 MG tablet Take 20-40 mg by mouth See admin instructions. Take blood pressure and depending on reading take 20mg  or 40mg  by mouth daily.   Yes [provider]  lactulose  (CHRONULAC ) 10 GM/15ML solution Take 45 mLs (30 g total) by mouth 3 (three) times daily. 03/13/23  Yes Pokhrel, Laxman, MD  levOCARNitine  (CARNITOR ) 330 MG tablet Take 330 mg by mouth 3 (three) times daily. 05/23/22  Yes [provider]  levothyroxine  (SYNTHROID ) 25 MCG tablet Take 25 mcg by mouth daily before breakfast.   Yes [provider]  liver oil-zinc  oxide (DESITIN) 40 % ointment Apply topically daily as needed for irritation. 03/13/23  Yes Pokhrel, Laxman, MD  Multiple Vitamin (MULTI-VITAMIN) tablet Take 1 tablet by mouth daily.   Yes [provider]  polyethylene glycol (MIRALAX  / GLYCOLAX ) 17 g packet Take 17 g by mouth daily as needed for mild constipation. 03/13/23  Yes Pokhrel, Laxman, MD  rifaximin  (XIFAXAN ) 550 MG TABS tablet Take 1 tablet (550 mg  total) by mouth 2 (two) times daily. Patient taking differently: Take 550 mg by mouth as needed. Flareups 12/25/23  Yes Wynetta Heckle, MD  spironolactone  (ALDACTONE ) 25 MG tablet Take 50 mg by mouth daily.   Yes [provider]    Current Facility-Administered Medications  Medication Dose Route Frequency Provider Last Rate Last Admin   acetaminophen  (TYLENOL ) tablet 650 mg  650 mg Oral Q6H PRN Dahal, Aminta Baldy, MD       Or   acetaminophen  (TYLENOL ) suppository 650 mg  650 mg Rectal Q6H PRN Dahal, Binaya, MD       albuterol  (PROVENTIL ) (2.5 MG/3ML) 0.083%  nebulizer solution 2.5 mg  2.5 mg Nebulization Q6H PRN Dahal, Binaya, MD       bisacodyl  (DULCOLAX) EC tablet 5 mg  5 mg Oral Daily PRN Dahal, Aminta Baldy, MD       Chlorhexidine  Gluconate Cloth 2 % PADS 6 each  6 each Topical Daily Dahal, Binaya, MD       hydrALAZINE  (APRESOLINE ) injection 10 mg  10 mg Intravenous Q6H PRN Dahal, Aminta Baldy, MD       Oral care mouth rinse  15 mL Mouth Rinse PRN Magdalene School, MD       oxyCODONE  (Oxy IR/ROXICODONE ) immediate release tablet 5 mg  5 mg Oral Q4H PRN Dahal, Binaya, MD       polyethylene glycol (MIRALAX  / GLYCOLAX ) packet 17 g  17 g Oral Daily PRN Dahal, Binaya, MD       rifaximin  (XIFAXAN ) tablet 550 mg  550 mg Oral BID Scarlette Currier, MD   550 mg at 03/30/24 1949   sodium zirconium cyclosilicate  (LOKELMA ) packet 10 g  10 g Oral Daily Dahal, Aminta Baldy, MD   10 g at 03/30/24 1948    Allergies as of 03/30/2024 - Review Complete 03/30/2024  Allergen Reaction Noted   Contrast media [iodinated contrast media] Hives 11/30/2011    Family History  Problem Relation Age of Onset   Arthritis Mother    Hearing loss Mother    Heart disease Mother    Hypertension Mother    Miscarriages / [place] Mother    Arthritis Father    Diabetes Father    Heart disease Father    Cancer Maternal Aunt    Breast cancer Neg Hx    Colon cancer Neg Hx    Esophageal cancer Neg Hx    Pancreatic cancer Neg Hx    Stomach cancer Neg Hx    Stroke Neg Hx     Social History   Socioeconomic History   Marital status: Married    Spouse name: Animal nutritionist   Number of children: 2   Years of education: Not on file   Highest education level: Some college, no degree  Occupational History   Occupation: Comptroller  Tobacco Use   Smoking status: Never   Smokeless tobacco: Never   Tobacco comments:    Never smoked  Vaping Use   Vaping status: Never Used  Substance and Sexual Activity   Alcohol use: Never   Drug use: Never   Sexual activity: Not Currently    Partners: Male   Other Topics Concern   Not on file  Social History Narrative   12/04/20   From: the area   Living: with husband, Dennard Fisher (1994)   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      Family: 2 children Trevor Fudge and Optometrist - 2 grandchildren - nearby      Enjoys: read  Exercise: trying to get back to exercise   Diet: healthy, limits fast foods      Safety   Seat belts: Yes    Guns: Yes  and secure   Safe in relationships: Yes    Social Drivers of Health   Financial Resource Strain: Low Risk  (03/27/2024)   Overall Financial Resource Strain (CARDIA)    Difficulty of Paying Living Expenses: Not very hard  Food Insecurity: No Food Insecurity (03/30/2024)   Hunger Vital Sign    Worried About Running Out of Food in the Last Year: Never true    Ran Out of Food in the Last Year: Never true  Transportation Needs: No Transportation Needs (03/30/2024)   PRAPARE - Administrator, Civil Service (Medical): No    Lack of Transportation (Non-Medical): No  Physical Activity: Insufficiently Active (03/27/2024)   Exercise Vital Sign    Days of Exercise per Week: 1 day    Minutes of Exercise per Session: 20 min  Stress: No Stress Concern Present (03/27/2024)   [employer] of Occupational Health - Occupational Stress Questionnaire    Feeling of Stress : Not at all  Social Connections: Patient Unable To Answer (03/30/2024)   Social Connection and Isolation Panel [NHANES]    Frequency of Communication with Friends and Family: Patient unable to answer    Frequency of Social Gatherings with Friends and Family: Patient unable to answer    Attends Religious Services: Patient unable to answer    Active Member of Clubs or Organizations: Patient unable to answer    Attends Banker Meetings: Patient unable to answer    Marital Status: Patient unable to answer  Recent Concern: Social Connections - Moderately Isolated (03/30/2024)   Social Connection and Isolation Panel [NHANES]     Frequency of Communication with Friends and Family: Patient unable to answer    Frequency of Social Gatherings with Friends and Family: More than three times a week    Attends Religious Services: Never    Database administrator or Organizations: No    Attends Banker Meetings: Never    Marital Status: Married  Catering manager Violence: Not At Risk (03/27/2024)   Humiliation, Afraid, Rape, and Kick questionnaire    Fear of Current or Ex-Partner: No    Emotionally Abused: No    Physically Abused: No    Sexually Abused: No   Review of Systems: Limited review of systems as patient is unresponsive.  See HPI.  Physical Exam: Vital signs in last 24 hours: Temp:  [93.8 F (34.3 C)-98.8 F (37.1 C)] 97.9 F (36.6 C) (05/09 0824) Pulse Rate:  [64-117] 83 (05/09 0900) Resp:  [14-37] 17 (05/09 0900) BP: (91-159)/(39-67) 106/67 (05/09 0900) SpO2:  [93 %-100 %] 99 % (05/09 0900) Weight:  [65.8 kg-65.9 kg] 65.9 kg (05/08 2242) Last BM Date : 03/31/24 General: Chronically ill appearing 73 year old female nonconversant, barely opens eyes with name called and with tactile stimulation. Head:  Normocephalic and atraumatic. Eyes:  No scleral icterus. Conjunctiva pink. Ears:  Normal auditory acuity. Nose:  No deformity, discharge or lesions. Mouth:  Dentition intact. No ulcers or lesions.  Neck:  Supple. No lymphadenopathy or thyromegaly.  Lungs: Breath sounds clear throughout. No wheezes, rhonchi or crackles.  Heart: Regular rate and rhythm, no murmurs. Abdomen: Flat, nondistended.  No ascites.  No overt signs of rebound or guarding.  Positive bowel sounds all 4 quadrants.  No palpable mass. Rectal: Deferred. Musculoskeletal:  Symmetrical  without gross deformities.  Pulses:  Normal pulses noted. Extremities:  Without clubbing or edema. Neurologic: Patient is nonconversant.  Opens eyes with verbal and tactile stimulation.  Does not follow commands.  Unable to perform asterixis  exam. Skin:  Intact without significant lesions or rashes.  No jaundice. Psych: Nonagitated.  Intake/Output from previous day: 05/08 0701 - 05/09 0700 In: 1050 [IV Piggyback:1050] Out: 600 [Urine:600] Intake/Output this shift: No intake/output data recorded.  Lab Results: Recent Labs    03/30/24 1515 03/31/24 0021  WBC 4.4 3.0*  HGB 9.9* 8.5*  HCT 30.4* 27.0*  PLT 135* 107*   BMET Recent Labs    03/30/24 1850 03/31/24 0021 03/31/24 0320  NA 133* 139 136  K 6.3* 4.9 5.1  CL 101 107 105  CO2 25 22 23   GLUCOSE 87 68* 78  BUN 27* 27* 27*  CREATININE 0.98 1.15* 1.08*  CALCIUM  9.1 9.1 8.8*   LFT Recent Labs    03/31/24 0320  PROT 6.0*  ALBUMIN  2.4*  AST 83*  ALT 53*  ALKPHOS 126  BILITOT 1.5*   PT/INR Recent Labs    03/30/24 1515  LABPROT 17.5*  INR 1.4*   Hepatitis Panel No results for input(s): "HEPBSAG", "HCVAB", "HEPAIGM", "HEPBIGM" in the last 72 hours.    Studies/Results: No results found.  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 Admitted with recurrent hepatic encephalopathy. T. Bili 1.5. AST 83. ALT 53.  INR 1.4. MELD 3.0: Ammonia 107 -> 53. Blood cultures in process.  -Xifaxan  550 mg 1 tab p.o. twice daily when awake to safely swallow oral meds -Lactulose  enemas Q 6 hours -Continue to monitor neurostatus closely -NPO as long as patient remains somnolent, ok to start clear liquids when adequately awake to swallow -CBC, CMP and PT/INR in a.m. -IR consult to assess TIPS -Likely has adrenal insufficiency, ? consider Hydrocortisone   -In setting of unknown triggering factor for hepatic encephalopathy, recommend completing infectious work up to include chest xray, urine with urine culture (urinalysis ordered, specimen not yet collected, may  need  to in and out cath to obtain urine specimen), defer to the hospitalist. Blood cultures pending.  -? Head CT vs brain MRI -Await further recommendations per Dr. Willy Harvest  Hyperkalemia, received Lokelma , K+ 6.0 -> 6.3 -> 4.9 -> 5.1. -BMP in am  Macrocytic anemia.  Admission hemoglobin 9.9 (baseline  Hg 11.2) -> 8.5. No overt GI bleeding. -CBC in am -Transfuse for Hg < 7  Thrombocytopenia secondary cirrhosis   Embolic CVA 11/2023   History of Adenocarcinoma of the cecum, Stage IIIB, T3N1 with lymph node mass measuring 4 cm, S/P hemicolectomy and adjuvant FOLFOX x 12 cycles from 02/05/03-09/24/03.     Tory Freiberg  03/31/2024, 12:44 PM  [facility] rounding physician:  I have seen and evaluated the patient and spoken to the husband as well.  Chart reviewed.  Agree with Ms. [name] note above.  Patient has recurrent hepatic encephalopathy last admitted in January 2025.  We are searching for occult infection.  Lactulose  enemas have been started.  Regarding TIPS, we discussed with IR, and duplex ultrasound recommended.  In-stent stenosis if she has that (apparently has had longstanding issues and they did not intervene due to hepatic encephalopathy issues) it would not lead to encephalopathy.  I have ordered a duplex ultrasound of the liver (liver Doppler) and also  ascites search ultrasound.  SBP could trigger hepatic encephalopathy.  She does have a history of adrenal insufficiency and was treated when hospitalized before.  Depending upon clinical course will consider the addition of stress dose steroids.  If she fails to respond to treatment for encephalopathy can consider CNS evaluation with imaging.  She does have a history of a stroke.  Kenney Peacemaker, MD, [facility] See Tilford Foley on call - gastroenterology for best contact person 03/31/2024 3:05 PM

## 2024-03-31 NOTE — Progress Notes (Signed)
 PROGRESS NOTE    [name]  ZOX:[id number] DOB: Sep 21, 1951 DOA: 03/30/2024 PCP: Dorothe Gaster, NP   Brief Narrative:  This 73 yrs old female with PMH significant for cecal cancer status post hemicolectomy, chemo which induced liver cirrhosis with portal hypertension, EVs s/p ligation, s/p TIPS 2012, prior hepatic encephalopathy, thrombocytopenia.  Patient was brought to the ED for worsening mental status.  Patient takes lactulose  3 times daily at home whenever her confusion worsen then husband increases the dose of lactulose  and also gives her rifaximin  for few days.  Patient continued to remain confused despite being given increased dose of lactulose  and rifaximin .  Patient has not had a bowel movement in last 4 days.  In the interval patient became very confused , disoriented and sleepy.  She was hypothermic in the ED with ammonia level 107.  Patient was admitted for further evaluation. Patient was started on Bair hugger for hypothermia.  She was also given dextrose , insulin  and Lokelma  for hyperkalemia.  Patient was admitted for further evaluation.  Assessment & Plan:   Active Problems:   Acute hepatic encephalopathy (HCC)  Acute hepatic encephalopathy: > Improving Patient presented with progressive confusion, somnolence and no bowel movement in last 4 days. Ammonia level significantly elevated to over 100. Continue Lactulose  and rifaximin  . Repeat ammonia level improved to 53. Mental status is significantly improved.  Continue to monitor.   Acute hyperkalemia: > Resolved. Potassium significantly elevated to 6.  No EKG changes. Likely secondary to Aldactone . She was given dextrose , insulin , Lokelma , sodium bicarbonate . Repeat potassium improved.   Hypothermia - r/o infection: Initial temperature was very low at 90.8.  She was placed on [employer]  Gradually improved temperature.   WBC count normal, lactic acid level normal.  No abdominal tenderness at all to suspect  SBP. Denies urinary symptoms. Follow-up blood cultures.  H/o liver cirrhosis with portal hypertension: s/p TIPS 2012 PTA meds-Aldactone  50 mg daily, Lasix  20 mg daily and additional 20 mg daily as needed Continue Lactulose  and Xifaxan . Does not seem to be in beta-blocker probably because of labile blood pressure. Resume Lasix  and Aldactone  both.   Chronic macrocytic anemia: EVs s/p ligation Baseline hemoglobin more than 10.  Continue to monitor No obvious visible bleeding.   Thrombocytopenia Platelet low but stable.  Continue to monitor  H/o cecal cancer s/p hemicolectomy 2003 Currently in remission.   Impaired mobility: Uses cane at baseline May benefit from PT eval once improves clinically.   DVT prophylaxis: SCDs Code Status:Full code Family Communication: No family at bed side. Disposition Plan:    Status is: Inpatient Remains inpatient appropriate because: Admitted for hepatic encephalopathy.  Consultants:  Gastroenterology  Procedures: None  Antimicrobials:  Anti-infectives (From admission, onward)    Start     Dose/Rate Route Frequency Ordered Stop   03/30/24 1900  rifaximin  (XIFAXAN ) tablet 550 mg        550 mg Oral 2 times daily 03/30/24 1800        Subjective: Patient was seen and examined at bedside.  Overnight events noted. Patient still appears confused but following commands.   As per husband she is much better than yesterday and improving.  Objective: Vitals:   03/31/24 0700 03/31/24 0800 03/31/24 0824 03/31/24 0900  BP: (!) 110/44 (!) 111/39  106/67  Pulse: 86 83 79 83  Resp: 14 (!) 29 (!) 26 17  Temp:   97.9 F (36.6 C)   TempSrc:   Oral   SpO2: 98% 99%  100% 99%  Weight:      Height:        Intake/Output Summary (Last 24 hours) at 03/31/2024 1035 Last data filed at 03/31/2024 0655 Gross per 24 hour  Intake 1050 ml  Output 600 ml  Net 450 ml   Filed Weights   03/30/24 1444 03/30/24 2242  Weight: 65.8 kg 65.9 kg     Examination:  General exam: Appears calm and comfortable, severely deconditioned, not in any acute distress. Respiratory system: Clear to auscultation. Respiratory effort normal.  RR 15 Cardiovascular system: S1 & S2 heard, RRR. No JVD, murmurs, rubs, gallops or clicks.  Gastrointestinal system: Abdomen is non distended, soft and non tender. Normal bowel sounds heard. Central nervous system: Alert and oriented x 1. No focal neurological deficits. Extremities: No edema, no cyanosis, no clubbing. Skin: No rashes, lesions or ulcers Psychiatry: Mood & affect appropriate.     Data Reviewed: I have personally reviewed following labs and imaging studies  CBC: Recent Labs  Lab 03/30/24 1515 03/31/24 0021  WBC 4.4 3.0*  NEUTROABS 2.9  --   HGB 9.9* 8.5*  HCT 30.4* 27.0*  MCV 103.4* 108.0*  PLT 135* 107*   Basic Metabolic Panel: Recent Labs  Lab 03/30/24 1515 03/30/24 1850 03/31/24 0021 03/31/24 0320  NA 136 133* 139 136  K 6.0* 6.3* 4.9 5.1  CL 103 101 107 105  CO2 25 25 22 23   GLUCOSE 79 87 68* 78  BUN 29* 27* 27* 27*  CREATININE 1.02* 0.98 1.15* 1.08*  CALCIUM  9.7 9.1 9.1 8.8*  MG  --   --  1.4*  --   PHOS  --   --  2.5  --    GFR: Estimated Creatinine Clearance: 42.4 mL/min (A) (by C-G formula based on SCr of 1.08 mg/dL (H)). Liver Function Tests: Recent Labs  Lab 03/30/24 1515 03/30/24 1850 03/31/24 0320  AST 105* 95* 83*  ALT 64* 57* 53*  ALKPHOS 154* 139* 126  BILITOT 1.1 1.2 1.5*  PROT 7.2 6.5 6.0*  ALBUMIN  2.9* 2.6* 2.4*   No results for input(s): "LIPASE", "AMYLASE" in the last 168 hours. Recent Labs  Lab 03/30/24 1515 03/31/24 0021  AMMONIA 107* 53*   Coagulation Profile: Recent Labs  Lab 03/30/24 1515  INR 1.4*   Cardiac Enzymes: No results for input(s): "CKTOTAL", "CKMB", "CKMBINDEX", "TROPONINI" in the last 168 hours. BNP (last 3 results) Recent Labs    08/10/23 0946  PROBNP 45.0   HbA1C: No results for input(s): "HGBA1C" in  the last 72 hours. CBG: Recent Labs  Lab 03/30/24 1606  GLUCAP 77   Lipid Profile: No results for input(s): "CHOL", "HDL", "LDLCALC", "TRIG", "CHOLHDL", "LDLDIRECT" in the last 72 hours. Thyroid  Function Tests: Recent Labs    03/30/24 1850  TSH 4.123   Anemia Panel: No results for input(s): "VITAMINB12", "FOLATE", "FERRITIN", "TIBC", "IRON", "RETICCTPCT" in the last 72 hours. Sepsis Labs: Recent Labs  Lab 03/30/24 1947  LATICACIDVEN 0.9    Recent Results (from the past 240 hours)  Blood culture (routine x 2)     Status: None (Preliminary result)   Collection Time: 03/30/24  7:07 PM   Specimen: BLOOD RIGHT ARM  Result Value Ref Range Status   Specimen Description   Final    BLOOD RIGHT ARM Performed at [facility], [address] [place], [place] [address]    Special Requests   Final    BOTTLES DRAWN AEROBIC AND ANAEROBIC Blood Culture adequate volume Performed  at [facility], [address], [place], [place] [address]    Culture   Final    NO GROWTH < 12 HOURS Performed at [facility], [address] [place], [place] [address]    Report Status PENDING  Incomplete  Blood culture (routine x 2)     Status: None (Preliminary result)   Collection Time: 03/30/24  7:38 PM   Specimen: BLOOD LEFT HAND  Result Value Ref Range Status   Specimen Description   Final    BLOOD LEFT HAND Performed at [facility], [address], [place], [place] [address]    Special Requests   Final    BOTTLES DRAWN AEROBIC AND ANAEROBIC Blood Culture adequate volume Performed at [facility], [address], [place], [place] [address]    Culture   Final    NO GROWTH < 12 HOURS Performed at [facility], [address] [place], [place] [address]    Report Status PENDING  Incomplete  MRSA Next Gen by PCR, Nasal     Status: None   Collection Time: 03/30/24 10:11 PM   Specimen: Nasal Mucosa; Nasal Swab  Result Value Ref  Range Status   MRSA by PCR Next Gen NOT DETECTED NOT DETECTED Final    Comment: (NOTE) The GeneXpert MRSA Assay (FDA approved for NASAL specimens only), is one component of a comprehensive MRSA colonization surveillance program. It is not intended to diagnose MRSA infection nor to guide or monitor treatment for MRSA infections. Test performance is not FDA approved in patients less than 7 years old. Performed at [facility], [address] [place], [place] [address]    Radiology Studies: No results found.  Scheduled Meds:  Chlorhexidine  Gluconate Cloth  6 each Topical Daily   rifaximin   550 mg Oral BID   sodium zirconium cyclosilicate   10 g Oral Daily   Continuous Infusions:   LOS: 1 day    Time spent: 50 mins    Magdalene School, MD [facility]   If 7PM-7AM, please contact night-coverage

## 2024-03-31 NOTE — TOC Initial Note (Signed)
 Transition of Care [facility]) - Initial/Assessment Note    Patient Details  Name: [name] MRN: [id number] Date of Birth: May 09, 1951  [facility] CM/SW Contact:    Amaryllis Junior, LCSW Phone Number: 03/31/2024, 2:52 PM  Clinical Narrative:                 Pt from home with spouse. Pt continues medical workup. Pt currently disoriented x4. Pt spouse provides support. TOC following for dc needs.     Barriers to Discharge: Continued Medical Work up   Patient Goals and CMS Choice Patient states their goals for this hospitalization and ongoing recovery are:: return home CMS Medicare.gov Compare Post Acute Care list provided to::  (NA) Choice offered to / list presented to : NA Brice ownership interest in [facility].provided to::  (NA)    Expected Discharge Plan and Services In-house Referral: NA Discharge Planning Services: NA   Living arrangements for the past 2 months: Single Family Home                 DME Arranged: N/A DME Agency: NA       HH Arranged: NA HH Agency: NA        Prior Living Arrangements/Services Living arrangements for the past 2 months: Single Family Home Lives with:: Spouse   Do you feel safe going back to the place where you live?: Yes      Need for Family Participation in Patient Care: Yes (Comment) Care giver support system in place?: Yes (comment)   Criminal Activity/Legal Involvement Pertinent to Current Situation/Hospitalization: No - Comment as needed  Activities of Daily Living   ADL Screening (condition at time of admission) Independently performs ADLs?: Yes (appropriate for developmental age) Is the patient deaf or have difficulty hearing?: No Does the patient have difficulty seeing, even when wearing glasses/contacts?: No Does the patient have difficulty concentrating, remembering, or making decisions?: No  Permission Sought/Granted                  Emotional Assessment         Alcohol /  Substance Use: Not Applicable Psych Involvement: No (comment)  Admission diagnosis:  Acute hepatic encephalopathy (HCC) [K76.82] Patient Active Problem List   Diagnosis Date Noted   Acute hepatic encephalopathy (HCC) 03/30/2024   Cerebrovascular accident (CVA) (HCC) 03/16/2024   History of stroke 01/06/2024   Thrombocytopenia (HCC) 12/21/2023   Decreased GFR 12/10/2023   Preventative health care 09/09/2023   Lower extremity edema 08/10/2023   Cellulitis of both lower extremities 08/04/2023   Altered mental status 08/04/2023   Bradycardia 05/07/2023   Adrenal insufficiency (HCC) 03/19/2023   Protein-calorie malnutrition, severe (HCC) 02/26/2023   Weight loss 02/24/2023   Other constipation 02/24/2023   Generalized abdominal pain 02/24/2023   Hospital discharge follow-up 02/24/2023   Weakness 01/04/2023   Frequent UTI 05/18/2022   Anemia 05/18/2022   AKI (acute kidney injury) (HCC) 04/30/2022   Tachycardia-bradycardia syndrome (HCC) 02/05/2022   Hepatic encephalopathy (HCC) 02/03/2022   Basal cell carcinoma (BCC) 05/06/2021   Cecal cancer (HCC) 05/06/2021   Hypertension 05/06/2021   Decompensated liver cirrhosis with portal HTN and gastric varices 12/04/2020   Essential hypertension 08/12/2020   Murmur, cardiac 03/19/2020   Hypothyroidism 06/30/2019   History of basal cell cancer 06/30/2019   Hx of colon cancer, stage III 11/30/2011   Esophageal varices (HCC) 06/12/2010   Portal hypertension (HCC) 01/23/2008   PCP:  Dorothe Gaster, NP  Pharmacy:   [facility] - [place], [place] - [address] [place] [address] Phone: [phone] Fax: [phone]     Social Drivers of Health (SDOH) Social History: SDOH Screenings   Food Insecurity: No Food Insecurity (03/30/2024)  Housing: Low Risk  (03/30/2024)  Transportation Needs: No Transportation Needs (03/30/2024)  Utilities: Not At Risk (03/30/2024)  Alcohol  Screen: Low Risk  (03/27/2024)  Depression (PHQ2-9): Low Risk  (03/27/2024)  Financial Resource Strain: Low Risk  (03/27/2024)  Physical Activity: Insufficiently Active (03/27/2024)  Social Connections: Patient Unable To Answer (03/30/2024)  Recent Concern: Social Connections - Moderately Isolated (03/30/2024)  Stress: No Stress Concern Present (03/27/2024)  Tobacco Use: Low Risk  (03/27/2024)   SDOH Interventions:     Readmission Risk Interventions    03/31/2024    2:51 PM 12/23/2023    2:25 PM 04/27/2023    2:11 PM  Readmission Risk Prevention Plan  Transportation Screening Complete Complete Complete  PCP or Specialist Appt within 5-7 Days  Complete   PCP or Specialist Appt within 3-5 Days Complete    Home Care Screening  Complete   Medication Review (RN CM)  Complete   HRI or Home Care Consult Complete    Social Work Consult for Recovery Care Planning/Counseling Complete    Palliative Care Screening Not Applicable    Medication Review Oceanographer) Complete  Complete  PCP or Specialist appointment within 3-5 days of discharge   Complete  HRI or Home Care Consult   Complete  SW Recovery Care/Counseling Consult   Complete  Palliative Care Screening   Not Applicable  Skilled Nursing Facility   Not Applicable

## 2024-04-01 DIAGNOSIS — K7682 Hepatic encephalopathy: Secondary | ICD-10-CM | POA: Diagnosis not present

## 2024-04-01 LAB — COMPREHENSIVE METABOLIC PANEL WITH GFR
ALT: 49 U/L — ABNORMAL HIGH (ref 0–44)
AST: 76 U/L — ABNORMAL HIGH (ref 15–41)
Albumin: 2.3 g/dL — ABNORMAL LOW (ref 3.5–5.0)
Alkaline Phosphatase: 112 U/L (ref 38–126)
Anion gap: 9 (ref 5–15)
BUN: 28 mg/dL — ABNORMAL HIGH (ref 8–23)
CO2: 19 mmol/L — ABNORMAL LOW (ref 22–32)
Calcium: 9 mg/dL (ref 8.9–10.3)
Chloride: 115 mmol/L — ABNORMAL HIGH (ref 98–111)
Creatinine, Ser: 1.14 mg/dL — ABNORMAL HIGH (ref 0.44–1.00)
GFR, Estimated: 51 mL/min — ABNORMAL LOW (ref >60)
Glucose, Bld: 79 mg/dL (ref 70–99)
Potassium: 4.6 mmol/L (ref 3.5–5.1)
Sodium: 143 mmol/L (ref 135–145)
Total Bilirubin: 1.2 mg/dL (ref 0.0–1.2)
Total Protein: 5.6 g/dL — ABNORMAL LOW (ref 6.5–8.1)

## 2024-04-01 LAB — GLUCOSE, CAPILLARY
Glucose-Capillary: 130 mg/dL — ABNORMAL HIGH (ref 70–99)
Glucose-Capillary: 136 mg/dL — ABNORMAL HIGH (ref 70–99)
Glucose-Capillary: 73 mg/dL (ref 70–99)
Glucose-Capillary: 85 mg/dL (ref 70–99)
Glucose-Capillary: 89 mg/dL (ref 70–99)
Glucose-Capillary: 94 mg/dL (ref 70–99)

## 2024-04-01 LAB — CBC
HCT: 26.3 % — ABNORMAL LOW (ref 36.0–46.0)
Hemoglobin: 8.4 g/dL — ABNORMAL LOW (ref 12.0–15.0)
MCH: 34 pg (ref 26.0–34.0)
MCHC: 31.9 g/dL (ref 30.0–36.0)
MCV: 106.5 fL — ABNORMAL HIGH (ref 80.0–100.0)
Platelets: 112 10*3/uL — ABNORMAL LOW (ref 150–400)
RBC: 2.47 MIL/uL — ABNORMAL LOW (ref 3.87–5.11)
RDW: 17.3 % — ABNORMAL HIGH (ref 11.5–15.5)
WBC: 4 10*3/uL (ref 4.0–10.5)
nRBC: 0 % (ref 0.0–0.2)

## 2024-04-01 LAB — PROTIME-INR
INR: 1.5 — ABNORMAL HIGH (ref 0.8–1.2)
Prothrombin Time: 18.3 s — ABNORMAL HIGH (ref 11.4–15.2)

## 2024-04-01 LAB — MAGNESIUM: Magnesium: 1.7 mg/dL (ref 1.7–2.4)

## 2024-04-01 LAB — PHOSPHORUS: Phosphorus: 4.4 mg/dL (ref 2.5–4.6)

## 2024-04-01 LAB — AMMONIA: Ammonia: 43 umol/L — ABNORMAL HIGH (ref 9–35)

## 2024-04-01 MED ORDER — CARMEX CLASSIC LIP BALM EX OINT
TOPICAL_OINTMENT | CUTANEOUS | Status: DC | PRN
  Filled 2024-04-01: qty 10

## 2024-04-01 MED ORDER — DEXTROSE-SODIUM CHLORIDE 5-0.9 % IV SOLN: INTRAVENOUS | Status: AC

## 2024-04-01 MED ORDER — LACTULOSE 10 GM/15ML PO SOLN
10.0000 g | Freq: Once | ORAL | Status: AC
  Administered 2024-04-01: 10 g via ORAL
  Filled 2024-04-01: qty 30

## 2024-04-01 NOTE — Progress Notes (Addendum)
   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 Admitted with recurrent hepatic encephalopathy.   - improved, UA and blood cxs and CXR unrevealing and no ascites at least in RUQ - continue lactulose  enemas convert to po if able to tolerate clear liquid diet - Xifaxan    Hyperkalemia, received Lokelma  - resolved  Macrocytic anemia.  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   High complexity medical care   Subjective  She is waking up - daughter in room Denies pain Thirsty  UA negative Blood cxs neg Liver Doppler US  stable liver though venous velocities were elevated and no ascites reported - did have gallstones   Objective  BP (!) 115/53   Pulse 72   Temp 97.9 F (36.6 C) (Oral)   Resp 11   Ht 5\' 5"  (1.651 m)   Wt 65.9 kg   SpO2 98%   BMI 24.18 kg/m  Awake and responds to questions Knows person and place, thought it was 2028 ? Subtle asterixis Lungs cta ant Heart s1s2 2/6 SEM Abd soft NT Ext 1+ edema LE Fecal collection bag w/ dark (not melena) liquid stool     Latest Ref Rng & Units 04/01/2024    3:01 AM 03/31/2024   12:21 AM 03/30/2024    3:15 PM  CBC  WBC 4.0 - 10.5 K/uL 4.0  3.0  4.4   Hemoglobin 12.0 - 15.0 g/dL 8.4  8.5  9.9   Hematocrit 36.0 - 46.0 % 26.3  27.0  30.4   Platelets 150 - 400 K/uL 112  107  135    Recent Labs  Lab 03/30/24 1515 03/30/24 1850 03/31/24 0320 04/01/24 0301  AST 105* 95* 83* 76*  ALT 64* 57* 53* 49*  ALKPHOS 154* 139* 126 112  BILITOT 1.1 1.2 1.5* 1.2  PROT 7.2 6.5 6.0* 5.6*   ALBUMIN  2.9* 2.6* 2.4* 2.3*  INR 1.4*  --   --  1.5*   Recent Labs  Lab 03/30/24 1515 03/30/24 1850 03/31/24 0021 03/31/24 0320 04/01/24 0301  NA 136 133* 139 136 143  K 6.0* 6.3* 4.9 5.1 4.6  CL 103 101 107 105 115*  CO2 25 25 22 23  19*  GLUCOSE 79 87 68* 78 79  BUN 29* 27* 27* 27* 28*  CREATININE 1.02* 0.98 1.15* 1.08* 1.14*  CALCIUM  9.7 9.1 9.1 8.8* 9.0  MG  --   --  1.4*  --  1.7  PHOS  --   --  2.5  --  4.4   NH3 43 down from 53 yesterday and 107 day before   Liver Doppler US  5/9 images viewed IMPRESSION: 1. Patent TIPS. Globally elevated venous velocities when compared to prior examination may be related to technique. 2. Incidentally noted cholelithiasis.     Kenney Peacemaker, MD, [facility] See Tilford Foley on call - gastroenterology for best contact person 04/01/2024 10:24 AM

## 2024-04-01 NOTE — Progress Notes (Addendum)
 PROGRESS NOTE    [name]  ZOX:[id number] DOB: 14-Nov-1951 DOA: 03/30/2024 PCP: Dorothe Gaster, NP   Brief Narrative:  This 73 yrs old female with PMH significant for cecal cancer status post hemicolectomy, chemo which induced liver cirrhosis with portal hypertension, EVs s/p ligation, s/p TIPS 2012, prior hepatic encephalopathy, thrombocytopenia.  Patient was brought to the ED for worsening mental status.  Patient takes lactulose  3 times daily at home whenever her confusion worsen then husband increases the dose of lactulose  and also gives her rifaximin  for few days.  Patient continued to remain confused despite being given increased dose of lactulose  and rifaximin .  Patient has not had a bowel movement in last 4 days.  In the interval patient became very confused , disoriented and sleepy.  She was hypothermic in the ED with ammonia level 107.  Patient was admitted for further evaluation. Patient was started on Bair hugger for hypothermia.  She was also given dextrose , insulin  and Lokelma  for hyperkalemia.  Patient was admitted for further evaluation.  Assessment & Plan:   Active Problems:   Acute hepatic encephalopathy (HCC)  Acute hepatic encephalopathy: > Improving Patient presented with progressive confusion, somnolence and no bowel movement in last 4 days. Ammonia level significantly elevated to over 100 on arrival. Continue Lactulose  and rifaximin  . Repeat ammonia level improved to 53> 43. Mental status has significantly improved.   Continue lactulose  enema and switch to oral lactulose  if she is able to take orally   Acute hyperkalemia: > Resolved. Potassium significantly elevated to 6.  No EKG changes. Likely secondary to Aldactone . She was given dextrose , insulin , Lokelma , sodium bicarbonate . Repeat potassium improved.   Hypothermia - r/o infection: Initial temperature was very low at 90.8.  She was placed on [employer]  Gradually improved temperature.   WBC count normal,  lactic acid level normal.  No abdominal tenderness at all to suspect SBP. Denies urinary symptoms.  UA, blood cultures and chest x-ray unrevealing. No ascites and right upper quadrant ultrasound.  H/o liver cirrhosis with portal hypertension: s/p TIPS 2012 PTA meds-Aldactone  50 mg daily, Lasix  20 mg daily and additional 20 mg daily as needed Continue Lactulose  and Xifaxan . Does not seem to be in beta-blocker probably because of labile blood pressure. Resume Lasix  and Aldactone  both when able to take oral..   Chronic macrocytic anemia: EVs s/p ligation Baseline hemoglobin more than 10.  Continue to monitor No obvious visible bleeding.   Thrombocytopenia Platelet low but stable.  Continue to monitor  H/o cecal cancer s/p hemicolectomy 2003 Currently in remission.   Impaired mobility: Uses cane at baseline May benefit from PT eval once improves clinically.   DVT prophylaxis: SCDs Code Status:Full code Family Communication: No family at bed side. Disposition Plan:    Status is: Inpatient Remains inpatient appropriate because: Admitted for hepatic encephalopathy.  Consultants:  Gastroenterology  Procedures: None  Antimicrobials:  Anti-infectives (From admission, onward)    Start     Dose/Rate Route Frequency Ordered Stop   03/30/24 1900  rifaximin  (XIFAXAN ) tablet 550 mg        550 mg Oral 2 times daily 03/30/24 1800        Subjective: Patient was seen and examined at bedside. Overnight events noted. Patient is much improved. She is following commands. She is still getting lactulose  enema , She has few bowel movements.  Objective: Vitals:   04/01/24 0600 04/01/24 0700 04/01/24 0800 04/01/24 0900  BP: (!) 121/53 (!) 100/49 (!) 115/53  Pulse: 86 73 72   Resp: 11 15 11    Temp:    97.9 F (36.6 C)  TempSrc:    Oral  SpO2: 99% 100% 98%   Weight:      Height:        Intake/Output Summary (Last 24 hours) at 04/01/2024 1049 Last data filed at 04/01/2024  0540 Gross per 24 hour  Intake 4000 ml  Output 3175 ml  Net 825 ml   Filed Weights   03/30/24 1444 03/30/24 2242  Weight: 65.8 kg 65.9 kg    Examination:  General exam: Appears calm and comfortable, severely deconditioned, not in any acute distress. Respiratory system: CTA Bilaterally. Respiratory effort normal.  RR 15 Cardiovascular system: S1 & S2 heard, RRR. No JVD, murmurs, rubs, gallops or clicks.  Gastrointestinal system: Abdomen is non distended, soft and non tender. Normal bowel sounds heard. Central nervous system: Alert and oriented x 1. No focal neurological deficits. Extremities: No edema, no cyanosis, no clubbing. Skin: No rashes, lesions or ulcers Psychiatry: Mood & affect appropriate.     Data Reviewed: I have personally reviewed following labs and imaging studies  CBC: Recent Labs  Lab 03/30/24 1515 03/31/24 0021 04/01/24 0301  WBC 4.4 3.0* 4.0  NEUTROABS 2.9  --   --   HGB 9.9* 8.5* 8.4*  HCT 30.4* 27.0* 26.3*  MCV 103.4* 108.0* 106.5*  PLT 135* 107* 112*   Basic Metabolic Panel: Recent Labs  Lab 03/30/24 1515 03/30/24 1850 03/31/24 0021 03/31/24 0320 04/01/24 0301  NA 136 133* 139 136 143  K 6.0* 6.3* 4.9 5.1 4.6  CL 103 101 107 105 115*  CO2 25 25 22 23  19*  GLUCOSE 79 87 68* 78 79  BUN 29* 27* 27* 27* 28*  CREATININE 1.02* 0.98 1.15* 1.08* 1.14*  CALCIUM  9.7 9.1 9.1 8.8* 9.0  MG  --   --  1.4*  --  1.7  PHOS  --   --  2.5  --  4.4   GFR: Estimated Creatinine Clearance: 40.1 mL/min (A) (by C-G formula based on SCr of 1.14 mg/dL (H)). Liver Function Tests: Recent Labs  Lab 03/30/24 1515 03/30/24 1850 03/31/24 0320 04/01/24 0301  AST 105* 95* 83* 76*  ALT 64* 57* 53* 49*  ALKPHOS 154* 139* 126 112  BILITOT 1.1 1.2 1.5* 1.2  PROT 7.2 6.5 6.0* 5.6*  ALBUMIN  2.9* 2.6* 2.4* 2.3*   No results for input(s): "LIPASE", "AMYLASE" in the last 168 hours. Recent Labs  Lab 03/30/24 1515 03/31/24 0021 04/01/24 0301  AMMONIA 107* 53*  43*   Coagulation Profile: Recent Labs  Lab 03/30/24 1515 04/01/24 0301  INR 1.4* 1.5*   Cardiac Enzymes: No results for input(s): "CKTOTAL", "CKMB", "CKMBINDEX", "TROPONINI" in the last 168 hours. BNP (last 3 results) Recent Labs    08/10/23 0946  PROBNP 45.0   HbA1C: No results for input(s): "HGBA1C" in the last 72 hours. CBG: Recent Labs  Lab 03/31/24 2048 03/31/24 2327 04/01/24 0031 04/01/24 0441 04/01/24 0750  GLUCAP 75 69* 130* 73 94   Lipid Profile: No results for input(s): "CHOL", "HDL", "LDLCALC", "TRIG", "CHOLHDL", "LDLDIRECT" in the last 72 hours. Thyroid  Function Tests: Recent Labs    03/30/24 1850  TSH 4.123   Anemia Panel: No results for input(s): "VITAMINB12", "FOLATE", "FERRITIN", "TIBC", "IRON", "RETICCTPCT" in the last 72 hours. Sepsis Labs: Recent Labs  Lab 03/30/24 1947  LATICACIDVEN 0.9    Recent Results (from the past 240 hours)  Blood culture (routine  x 2)     Status: None (Preliminary result)   Collection Time: 03/30/24  7:07 PM   Specimen: BLOOD RIGHT ARM  Result Value Ref Range Status   Specimen Description   Final    BLOOD RIGHT ARM Performed at [facility], [address], [place], [place] [address]    Special Requests   Final    BOTTLES DRAWN AEROBIC AND ANAEROBIC Blood Culture adequate volume Performed at [facility], [address], [place], [place] [address]    Culture   Final    NO GROWTH 2 DAYS Performed at [facility], [address] [place], [place] [address]    Report Status PENDING  Incomplete  Blood culture (routine x 2)     Status: None (Preliminary result)   Collection Time: 03/30/24  7:38 PM   Specimen: BLOOD LEFT HAND  Result Value Ref Range Status   Specimen Description   Final    BLOOD LEFT HAND Performed at [facility], [address], [place], [place] [address]    Special Requests   Final    BOTTLES DRAWN AEROBIC AND ANAEROBIC Blood Culture adequate  volume Performed at [facility], [address] [place], [place] [address]    Culture   Final    NO GROWTH 2 DAYS Performed at [facility], [address] [place], [place] [address]    Report Status PENDING  Incomplete  MRSA Next Gen by PCR, Nasal     Status: None   Collection Time: 03/30/24 10:11 PM   Specimen: Nasal Mucosa; Nasal Swab  Result Value Ref Range Status   MRSA by PCR Next Gen NOT DETECTED NOT DETECTED Final    Comment: (NOTE) The GeneXpert MRSA Assay (FDA approved for NASAL specimens only), is one component of a comprehensive MRSA colonization surveillance program. It is not intended to diagnose MRSA infection nor to guide or monitor treatment for MRSA infections. Test performance is not FDA approved in patients less than 56 years old. Performed at [facility], [address] [place], [place] [address]    Radiology Studies: US  ABDOMEN LIMITED WITH LIVER DOPPLER Result Date: 03/31/2024 CLINICAL DATA:  History of cirrhosis status post TIPS EXAM: DUPLEX ULTRASOUND OF LIVER TECHNIQUE: Color and duplex Doppler ultrasound was performed to evaluate the hepatic in-flow and out-flow vessels. COMPARISON:  Hepatic Doppler ultrasound examination dated 12/22/2023 FINDINGS: Liver: Normal parenchymal echogenicity. Normal hepatic contour without nodularity. No focal lesion, mass or intrahepatic biliary ductal dilatation. Incidentally noted cholelithiasis. Gallstone measures 8 mm. No gallbladder wall thickening. Common bile duct measures 4 mm. Portal Vein Velocities: Main: 70 cm/sec, previously 16 centimeters/second Right: 71 cm/sec, previously 53 centimeters/second Left: 41 cm/sec, previously in 12 centimeters/second Transjugular intrahepatic portosystemic shunt velocities: Prox:  74 cm/sec Mid: 78 cm/sec Dist:  96 cm/sec, previously 49 cm/second Hepatic Vein Velocities Right:  55 cm/sec, previously 20 cm/second Middle:  61 cm/sec, previously 19  centimeters/second Left: Not measured. Hepatic Artery Velocity:  128 cm/sec Splenic Vein Velocity:  17 cm/sec Ascites: None Varices: Previously documented. IMPRESSION: 1. Patent TIPS. Globally elevated venous velocities when compared to prior examination may be related to technique. 2. Incidentally noted cholelithiasis. Electronically Signed   By: Limin  Xu M.D.   On: 03/31/2024 19:09   DG CHEST PORT 1 VIEW Result Date: 03/31/2024 CLINICAL DATA:  Pneumonia. EXAM: PORTABLE CHEST 1 VIEW COMPARISON:  12/21/2023 FINDINGS: Lung volumes are low. The heart is upper  normal in size but stable. No confluent airspace disease. Bronchovascular crowding related to low lung volumes. No pneumothorax or large pleural effusion. TIPS in the right upper quadrant of the abdomen. IMPRESSION: Low lung volumes with bronchovascular crowding. No confluent airspace disease. Electronically Signed   By: Chadwick Colonel M.D.   On: 03/31/2024 17:17    Scheduled Meds:  Chlorhexidine  Gluconate Cloth  6 each Topical Daily   lactulose   300 mL Rectal Q6H   rifaximin   550 mg Oral BID   sodium zirconium cyclosilicate   10 g Oral Daily   Continuous Infusions:  dextrose  5 % and 0.9 % NaCl 40 mL/hr at 04/01/24 0645     LOS: 2 days    Time spent: 50 mins    Magdalene School, MD [facility]   If 7PM-7AM, please contact night-coverage

## 2024-04-02 ENCOUNTER — Encounter (HOSPITAL_COMMUNITY): Payer: Self-pay | Admitting: Internal Medicine

## 2024-04-02 DIAGNOSIS — K7682 Hepatic encephalopathy: Secondary | ICD-10-CM | POA: Diagnosis not present

## 2024-04-02 LAB — COMPREHENSIVE METABOLIC PANEL WITH GFR
ALT: 45 U/L — ABNORMAL HIGH (ref 0–44)
AST: 74 U/L — ABNORMAL HIGH (ref 15–41)
Albumin: 2.3 g/dL — ABNORMAL LOW (ref 3.5–5.0)
Alkaline Phosphatase: 113 U/L (ref 38–126)
Anion gap: 6 (ref 5–15)
BUN: 20 mg/dL (ref 8–23)
CO2: 20 mmol/L — ABNORMAL LOW (ref 22–32)
Calcium: 8.8 mg/dL — ABNORMAL LOW (ref 8.9–10.3)
Chloride: 112 mmol/L — ABNORMAL HIGH (ref 98–111)
Creatinine, Ser: 0.88 mg/dL (ref 0.44–1.00)
GFR, Estimated: >60 mL/min (ref >60)
Glucose, Bld: 76 mg/dL (ref 70–99)
Potassium: 4.2 mmol/L (ref 3.5–5.1)
Sodium: 138 mmol/L (ref 135–145)
Total Bilirubin: 1.3 mg/dL — ABNORMAL HIGH (ref 0.0–1.2)
Total Protein: 5.9 g/dL — ABNORMAL LOW (ref 6.5–8.1)

## 2024-04-02 LAB — CBC
HCT: 26.4 % — ABNORMAL LOW (ref 36.0–46.0)
Hemoglobin: 8.2 g/dL — ABNORMAL LOW (ref 12.0–15.0)
MCH: 33.7 pg (ref 26.0–34.0)
MCHC: 31.1 g/dL (ref 30.0–36.0)
MCV: 108.6 fL — ABNORMAL HIGH (ref 80.0–100.0)
Platelets: 106 10*3/uL — ABNORMAL LOW (ref 150–400)
RBC: 2.43 MIL/uL — ABNORMAL LOW (ref 3.87–5.11)
RDW: 17.2 % — ABNORMAL HIGH (ref 11.5–15.5)
WBC: 4.3 10*3/uL (ref 4.0–10.5)
nRBC: 0 % (ref 0.0–0.2)

## 2024-04-02 LAB — GLUCOSE, CAPILLARY
Glucose-Capillary: 105 mg/dL — ABNORMAL HIGH (ref 70–99)
Glucose-Capillary: 122 mg/dL — ABNORMAL HIGH (ref 70–99)
Glucose-Capillary: 76 mg/dL (ref 70–99)
Glucose-Capillary: 83 mg/dL (ref 70–99)
Glucose-Capillary: 86 mg/dL (ref 70–99)
Glucose-Capillary: 93 mg/dL (ref 70–99)

## 2024-04-02 LAB — AMMONIA: Ammonia: 35 umol/L (ref 9–35)

## 2024-04-02 MED ORDER — FUROSEMIDE 20 MG PO TABS
20.0000 mg | ORAL_TABLET | Freq: Every day | ORAL | Status: DC
  Administered 2024-04-02 – 2024-04-05 (×4): 20 mg via ORAL
  Filled 2024-04-02 (×4): qty 1

## 2024-04-02 MED ORDER — LACTULOSE 10 GM/15ML PO SOLN
30.0000 g | Freq: Three times a day (TID) | ORAL | Status: DC
  Administered 2024-04-02 – 2024-04-05 (×10): 30 g via ORAL
  Filled 2024-04-02 (×10): qty 60

## 2024-04-02 MED ORDER — POLYVINYL ALCOHOL 1.4 % OP SOLN
1.0000 [drp] | OPHTHALMIC | Status: DC | PRN
  Administered 2024-04-02: 1 [drp] via OPHTHALMIC
  Filled 2024-04-02: qty 15

## 2024-04-02 MED ORDER — ONDANSETRON HCL 4 MG/2ML IJ SOLN
4.0000 mg | Freq: Four times a day (QID) | INTRAMUSCULAR | Status: DC | PRN
  Administered 2024-04-02: 4 mg via INTRAVENOUS
  Filled 2024-04-02: qty 2

## 2024-04-02 MED ORDER — SPIRONOLACTONE 25 MG PO TABS
50.0000 mg | ORAL_TABLET | Freq: Every day | ORAL | Status: DC
  Administered 2024-04-02 – 2024-04-05 (×4): 50 mg via ORAL
  Filled 2024-04-02 (×4): qty 2

## 2024-04-02 MED ORDER — LACTULOSE 10 GM/15ML PO SOLN
10.0000 g | Freq: Once | ORAL | Status: AC
  Administered 2024-04-02: 10 g via ORAL
  Filled 2024-04-02: qty 30

## 2024-04-02 NOTE — Progress Notes (Signed)
 PROGRESS NOTE    [name]  JYN:[id number] DOB: 29-Apr-1951 DOA: 03/30/2024 PCP: Dorothe Gaster, NP   Brief Narrative:  This 73 yrs old female with PMH significant for cecal cancer status post hemicolectomy, chemo which induced liver cirrhosis with portal hypertension, EVs s/p ligation, s/p TIPS 2012, prior hepatic encephalopathy, thrombocytopenia.  Patient was brought to the ED for worsening mental status.  Patient takes lactulose  3 times daily at home whenever her confusion worsen then husband increases the dose of lactulose  and also gives her rifaximin  for few days.  Patient continued to remain confused despite being given increased dose of lactulose  and rifaximin .  Patient has not had a bowel movement in last 4 days.  In the interval patient became very confused , disoriented and sleepy.  She was hypothermic in the ED with ammonia level 107.  Patient was admitted for further evaluation. Patient was started on Bair hugger for hypothermia.  She was also given dextrose , insulin  and Lokelma  for hyperkalemia.  Patient was admitted for further evaluation.  Assessment & Plan:   Active Problems:   Acute hepatic encephalopathy (HCC)  Acute hepatic encephalopathy: > Improved. Patient presented with progressive confusion, somnolence and no bowel movement in last 4 days. Ammonia level significantly elevated to over 100 on arrival. Continue Lactulose  and rifaximin  . Repeat ammonia level improved to 53> 43 >35. Mental status has significantly improved.   Continue lactulose  enema and switch to oral lactulose  if she is able to take orally. Continue lactulose  30 g PO TID, Xifaxan  550 PO BID.   Acute hyperkalemia: > Resolved. Potassium significantly elevated to 6.0  No EKG changes. Likely secondary to Aldactone . She was given dextrose , insulin , Lokelma , sodium bicarbonate . Repeat potassium improved.  Aldactone  resumed.   Hypothermia - r/o infection: Initial temperature was very low at 90.8.  She  was placed on [employer]  Gradually improved temperature.   WBC count normal, lactic acid level normal.  No abdominal tenderness at all to suspect SBP. Denies urinary symptoms.  UA, blood cultures and chest x-ray unrevealing. No ascites and right upper quadrant ultrasound.  H/o liver cirrhosis with portal hypertension: s/p TIPS 2012 PTA meds-Aldactone  50 mg daily, Lasix  20 mg daily and additional 20 mg daily as needed Continue Lactulose  and Xifaxan . Does not seem to be in beta-blocker probably because of labile blood pressure. Resume Lasix  and Aldactone  as patient able to take oral.   Chronic macrocytic anemia: EVs s/p ligation Baseline hemoglobin more than 10.  Continue to monitor No obvious visible bleeding.   Thrombocytopenia Platelet low but stable.  Continue to monitor  H/o cecal cancer s/p hemicolectomy 2003 Currently in remission.   Impaired mobility: Uses cane at baseline May benefit from PT eval once improves clinically.   DVT prophylaxis: SCDs Code Status:Full code Family Communication: No family at bed side. Disposition Plan:    Status is: Inpatient Remains inpatient appropriate because: Admitted for hepatic encephalopathy.  Patient is PT and OT evaluation for disposition  Consultants:  Gastroenterology  Procedures: None  Antimicrobials:  Anti-infectives (From admission, onward)    Start     Dose/Rate Route Frequency Ordered Stop   03/30/24 1900  rifaximin  (XIFAXAN ) tablet 550 mg        550 mg Oral 2 times daily 03/30/24 1800        Subjective: Patient was seen and examined at bedside. Overnight events noted. Patient is much improved.  She is following commands.  Ammonia level down to 35. She has frequent bowel  movements.  Objective: Vitals:   04/01/24 1624 04/01/24 2004 04/02/24 0023 04/02/24 0429  BP: 127/66 117/61 125/69 128/69  Pulse: 60 82 74 68  Resp: 16 16 20 16   Temp:  97.6 F (36.4 C) 97.7 F (36.5 C) 98.2 F (36.8 C)  TempSrc:   Oral Oral Oral  SpO2: 98% 98% 97% 97%  Weight:      Height:        Intake/Output Summary (Last 24 hours) at 04/02/2024 1113 Last data filed at 04/02/2024 1010 Gross per 24 hour  Intake 2223.25 ml  Output 2245 ml  Net -21.75 ml   Filed Weights   03/30/24 1444 03/30/24 2242 04/01/24 1349  Weight: 65.8 kg 65.9 kg 63.5 kg    Examination:  General exam: Appears calm and comfortable, severely deconditioned, not in any distress. Respiratory system: CTA Bilaterally. Respiratory effort normal.  RR 16 Cardiovascular system: S1 & S2 heard, RRR. No JVD, murmurs, rubs, gallops or clicks.  Gastrointestinal system: Abdomen is non distended, soft and non tender. Normal bowel sounds heard. Central nervous system: Alert and oriented x 2. No focal neurological deficits. Extremities: No edema, no cyanosis, no clubbing. Skin: No rashes, lesions or ulcers Psychiatry: Mood & affect appropriate.     Data Reviewed: I have personally reviewed following labs and imaging studies  CBC: Recent Labs  Lab 03/30/24 1515 03/31/24 0021 04/01/24 0301 04/02/24 0420  WBC 4.4 3.0* 4.0 4.3  NEUTROABS 2.9  --   --   --   HGB 9.9* 8.5* 8.4* 8.2*  HCT 30.4* 27.0* 26.3* 26.4*  MCV 103.4* 108.0* 106.5* 108.6*  PLT 135* 107* 112* 106*   Basic Metabolic Panel: Recent Labs  Lab 03/30/24 1850 03/31/24 0021 03/31/24 0320 04/01/24 0301 04/02/24 0420  NA 133* 139 136 143 138  K 6.3* 4.9 5.1 4.6 4.2  CL 101 107 105 115* 112*  CO2 25 22 23  19* 20*  GLUCOSE 87 68* 78 79 76  BUN 27* 27* 27* 28* 20  CREATININE 0.98 1.15* 1.08* 1.14* 0.88  CALCIUM  9.1 9.1 8.8* 9.0 8.8*  MG  --  1.4*  --  1.7  --   PHOS  --  2.5  --  4.4  --    GFR: Estimated Creatinine Clearance: 49.9 mL/min (by C-G formula based on SCr of 0.88 mg/dL). Liver Function Tests: Recent Labs  Lab 03/30/24 1515 03/30/24 1850 03/31/24 0320 04/01/24 0301 04/02/24 0420  AST 105* 95* 83* 76* 74*  ALT 64* 57* 53* 49* 45*  ALKPHOS 154* 139* 126  112 113  BILITOT 1.1 1.2 1.5* 1.2 1.3*  PROT 7.2 6.5 6.0* 5.6* 5.9*  ALBUMIN  2.9* 2.6* 2.4* 2.3* 2.3*   No results for input(s): "LIPASE", "AMYLASE" in the last 168 hours. Recent Labs  Lab 03/30/24 1515 03/31/24 0021 04/01/24 0301 04/02/24 0420  AMMONIA 107* 53* 43* 35   Coagulation Profile: Recent Labs  Lab 03/30/24 1515 04/01/24 0301  INR 1.4* 1.5*   Cardiac Enzymes: No results for input(s): "CKTOTAL", "CKMB", "CKMBINDEX", "TROPONINI" in the last 168 hours. BNP (last 3 results) Recent Labs    08/10/23 0946  PROBNP 45.0   HbA1C: No results for input(s): "HGBA1C" in the last 72 hours. CBG: Recent Labs  Lab 04/01/24 1624 04/01/24 2002 04/02/24 0020 04/02/24 0427 04/02/24 0807  GLUCAP 89 136* 86 76 105*   Lipid Profile: No results for input(s): "CHOL", "HDL", "LDLCALC", "TRIG", "CHOLHDL", "LDLDIRECT" in the last 72 hours. Thyroid  Function Tests: Recent Labs  03/30/24 1850  TSH 4.123   Anemia Panel: No results for input(s): "VITAMINB12", "FOLATE", "FERRITIN", "TIBC", "IRON", "RETICCTPCT" in the last 72 hours. Sepsis Labs: Recent Labs  Lab 03/30/24 1947  LATICACIDVEN 0.9    Recent Results (from the past 240 hours)  Blood culture (routine x 2)     Status: None (Preliminary result)   Collection Time: 03/30/24  7:07 PM   Specimen: BLOOD RIGHT ARM  Result Value Ref Range Status   Specimen Description   Final    BLOOD RIGHT ARM Performed at [facility], [address] [place], [place] [address]    Special Requests   Final    BOTTLES DRAWN AEROBIC AND ANAEROBIC Blood Culture adequate volume Performed at [facility], [address], [place], [place] [address]    Culture   Final    NO GROWTH 3 DAYS Performed at [facility], [address] [place], [place] [address]    Report Status PENDING  Incomplete  Blood culture (routine x 2)     Status: None (Preliminary result)   Collection Time: 03/30/24  7:38 PM    Specimen: BLOOD LEFT HAND  Result Value Ref Range Status   Specimen Description   Final    BLOOD LEFT HAND Performed at [facility], [address] [place], [place] [address]    Special Requests   Final    BOTTLES DRAWN AEROBIC AND ANAEROBIC Blood Culture adequate volume Performed at [facility], [address] [place], [place] [address]    Culture   Final    NO GROWTH 3 DAYS Performed at [facility], [address], [place], [place] [address]    Report Status PENDING  Incomplete  MRSA Next Gen by PCR, Nasal     Status: None   Collection Time: 03/30/24 10:11 PM   Specimen: Nasal Mucosa; Nasal Swab  Result Value Ref Range Status   MRSA by PCR Next Gen NOT DETECTED NOT DETECTED Final    Comment: (NOTE) The GeneXpert MRSA Assay (FDA approved for NASAL specimens only), is one component of a comprehensive MRSA colonization surveillance program. It is not intended to diagnose MRSA infection nor to guide or monitor treatment for MRSA infections. Test performance is not FDA approved in patients less than 11 years old. Performed at [facility], [address] [place], [place] [address]    Radiology Studies: US  ABDOMEN LIMITED WITH LIVER DOPPLER Result Date: 03/31/2024 CLINICAL DATA:  History of cirrhosis status post TIPS EXAM: DUPLEX ULTRASOUND OF LIVER TECHNIQUE: Color and duplex Doppler ultrasound was performed to evaluate the hepatic in-flow and out-flow vessels. COMPARISON:  Hepatic Doppler ultrasound examination dated 12/22/2023 FINDINGS: Liver: Normal parenchymal echogenicity. Normal hepatic contour without nodularity. No focal lesion, mass or intrahepatic biliary ductal dilatation. Incidentally noted cholelithiasis. Gallstone measures 8 mm. No gallbladder wall thickening. Common bile duct measures 4 mm. Portal Vein Velocities: Main: 70 cm/sec, previously 16 centimeters/second Right: 71 cm/sec, previously 53 centimeters/second Left:  41 cm/sec, previously in 12 centimeters/second Transjugular intrahepatic portosystemic shunt velocities: Prox:  74 cm/sec Mid: 78 cm/sec Dist:  96 cm/sec, previously 49 cm/second Hepatic Vein Velocities Right:  55 cm/sec, previously 20 cm/second Middle:  61 cm/sec, previously 19 centimeters/second Left: Not measured. Hepatic Artery Velocity:  128 cm/sec Splenic Vein Velocity:  17 cm/sec Ascites: None Varices: Previously documented. IMPRESSION: 1. Patent TIPS. Globally elevated venous velocities when compared to prior examination may be related to technique.  2. Incidentally noted cholelithiasis. Electronically Signed   By: Limin  Xu M.D.   On: 03/31/2024 19:09   DG CHEST PORT 1 VIEW Result Date: 03/31/2024 CLINICAL DATA:  Pneumonia. EXAM: PORTABLE CHEST 1 VIEW COMPARISON:  12/21/2023 FINDINGS: Lung volumes are low. The heart is upper normal in size but stable. No confluent airspace disease. Bronchovascular crowding related to low lung volumes. No pneumothorax or large pleural effusion. TIPS in the right upper quadrant of the abdomen. IMPRESSION: Low lung volumes with bronchovascular crowding. No confluent airspace disease. Electronically Signed   By: Chadwick Colonel M.D.   On: 03/31/2024 17:17    Scheduled Meds:  furosemide   20 mg Oral Daily   lactulose   30 g Oral TID   rifaximin   550 mg Oral BID   spironolactone   50 mg Oral Daily   Continuous Infusions:     LOS: 3 days    Time spent: 35 mins    Magdalene School, MD [facility]   If 7PM-7AM, please contact night-coverage

## 2024-04-02 NOTE — Plan of Care (Signed)
   Problem: Education: Goal: Knowledge of General Education information will improve Description: Including pain rating scale, medication(s)/side effects and non-pharmacologic comfort measures Outcome: Progressing   Problem: Health Behavior/Discharge Planning: Goal: Ability to manage health-related needs will improve Outcome: Progressing   Problem: Nutrition: Goal: Adequate nutrition will be maintained Outcome: Progressing

## 2024-04-02 NOTE — Progress Notes (Addendum)
 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 Admitted with recurrent hepatic encephalopathy.   No clear trigger (? Adrenal isnufficiency)  - improved, UA and blood cxs and CXR unrevealing and no ascites at least in RUQ - lactulose  30 g tid po  - Xifaxan  550 bid -  resume spironolactone  50 mg and furosemide  25  today  Hyperkalemia, received Lokelma  - resolved ? Cause - spironolactone  vs adrenal insufficiency or combo?Aaron Aas She saw endocrine 4/14 (Dr. Vertell Gory) and repeat cort stim test ordered not resulted  - need to contact Dr. Vertell Gory (try Secure Chat) about the 4/14 cort stim test - husband says it was done in the office on 4/14 - it is not resulted as far as I can tell. She could have adrenal insufficiency and need steroids. We could also need to reduce spironolactone  dose - though she was on x years and kidney fx  was not off by much  - Will check a.m. cortisol  Macrocytic anemia.  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   Kenney Peacemaker, MD, [facility]  Gastroenterology See Tilford Foley on call - gastroenterology for best contact person 04/02/2024 10:25 AM     Subjective  She is waking up more - conversant - husband also in room Taking lactulose  po now Denies pain Ate some clears - would like solid food  UA negative Blood cxs neg Liver Doppler US  stable liver though venous velocities were elevated and no ascites reported - did have gallstones   Objective  BP 128/69 (BP Location: Right Arm)   Pulse 68   Temp 98.2 F (36.8 C) (Oral)    Resp 16   Ht 5\' 4"  (1.626 m)   Wt 63.5 kg   SpO2 97%   BMI 24.03 kg/m  Awake and responds to questions Knows person and place and year No asterixis  Lungs cta ant Heart s1s2 2/6 SEM Abd soft NT Ext 1+ edema LE Fecal collection bag w/ dark (not melena) liquid stool - being dced     Latest Ref Rng & Units 04/02/2024    4:20 AM 04/01/2024    3:01 AM 03/31/2024   12:21 AM  CBC  WBC 4.0 - 10.5 K/uL 4.3  4.0  3.0   Hemoglobin 12.0 - 15.0 g/dL 8.2  8.4  8.5   Hematocrit 36.0 - 46.0 % 26.4  26.3  27.0   Platelets 150 - 400 K/uL 106  112  107    Recent Labs  Lab 03/30/24 1515 03/30/24 1850 03/31/24 0320 04/01/24 0301 04/02/24 0420  AST 105* 95* 83* 76* 74*  ALT 64* 57* 53* 49* 45*  ALKPHOS 154* 139* 126 112 113  BILITOT 1.1 1.2 1.5* 1.2 1.3*  PROT 7.2 6.5 6.0* 5.6* 5.9*  ALBUMIN  2.9* 2.6* 2.4* 2.3* 2.3*  INR 1.4*  --   --  1.5*  --    Recent Labs  Lab 03/30/24 1850 03/31/24 0021 03/31/24 0320 04/01/24 0301 04/02/24 0420  NA 133* 139 136 143 138  K 6.3* 4.9 5.1 4.6 4.2  CL 101 107 105 115* 112*  CO2 25 22  23 19* 20*  GLUCOSE 87 68* 78 79 76  BUN 27* 27* 27* 28* 20  CREATININE 0.98 1.15* 1.08* 1.14* 0.88  CALCIUM  9.1 9.1 8.8* 9.0 8.8*  MG  --  1.4*  --  1.7  --   PHOS  --  2.5  --  4.4  --    NH3 35 was 43 yesterday   Liver Doppler US  5/9 images viewed IMPRESSION: 1. Patent TIPS. Globally elevated venous velocities when compared to prior examination may be related to technique. 2. Incidentally noted cholelithiasis.     Kenney Peacemaker, MD, [facility] [place] [facility] See Tilford Foley on call - gastroenterology for best contact person 04/02/2024 10:07 AM

## 2024-04-02 NOTE — Progress Notes (Signed)
 Patient vomited after taking a few bites of pizza. Patient felt better after vomiting. Another RN gave patient some ginger ale and cool washcloths. Even though there were not PRN nausea meds available, patient did not need them. Patient believes she started her first meal on a regular diet with too big/greasy of a meal. Patient is doing better now. Emesis episode charted.

## 2024-04-03 ENCOUNTER — Inpatient Hospital Stay (HOSPITAL_COMMUNITY)

## 2024-04-03 ENCOUNTER — Encounter: Payer: Self-pay | Admitting: "Endocrinology

## 2024-04-03 DIAGNOSIS — K7682 Hepatic encephalopathy: Secondary | ICD-10-CM | POA: Diagnosis not present

## 2024-04-03 LAB — BLOOD CULTURE ID PANEL (REFLEXED) - BCID2

## 2024-04-03 LAB — GLUCOSE, CAPILLARY
Glucose-Capillary: 100 mg/dL — ABNORMAL HIGH (ref 70–99)
Glucose-Capillary: 103 mg/dL — ABNORMAL HIGH (ref 70–99)
Glucose-Capillary: 112 mg/dL — ABNORMAL HIGH (ref 70–99)
Glucose-Capillary: 82 mg/dL (ref 70–99)
Glucose-Capillary: 85 mg/dL (ref 70–99)
Glucose-Capillary: 88 mg/dL (ref 70–99)
Glucose-Capillary: 99 mg/dL (ref 70–99)

## 2024-04-03 LAB — CORTISOL-AM, BLOOD: Cortisol - AM: 17.7 ug/dL (ref 6.7–22.6)

## 2024-04-03 LAB — AMMONIA: Ammonia: 42 umol/L — ABNORMAL HIGH (ref 9–35)

## 2024-04-03 MED ORDER — MELATONIN 3 MG PO TABS
3.0000 mg | ORAL_TABLET | Freq: Every evening | ORAL | Status: DC | PRN
  Administered 2024-04-03: 3 mg via ORAL
  Filled 2024-04-03: qty 1

## 2024-04-03 MED ORDER — LACTULOSE ENEMA
300.0000 mL | Freq: Three times a day (TID) | ORAL | Status: DC
  Filled 2024-04-03 (×8): qty 300

## 2024-04-03 NOTE — Progress Notes (Signed)
 PHARMACY - PHYSICIAN COMMUNICATION CRITICAL VALUE ALERT - BLOOD CULTURE IDENTIFICATION (BCID)  [name] is an 73 y.o. female who presented to [facility] on 03/30/2024 with acute hepatic encephalopathy.  Assessment: BCID + Staph epidermidis (vanc resistant) in 1 out of 4 bottles.  Patient afebrile, normal WBC, normal lactic acid.  Suspect contaminant.  Name of physician (or Provider) Contacted: Denece Finger, FNP  Current antibiotics: rifaximin   Changes to prescribed antibiotics recommended:  No changes at this time  Results for orders placed or performed during the hospital encounter of 03/30/24  Blood Culture ID Panel (Reflexed) (Collected: 03/30/2024  7:07 PM)  Result Value Ref Range   Enterococcus faecalis NOT DETECTED NOT DETECTED   Enterococcus Faecium NOT DETECTED NOT DETECTED   Listeria monocytogenes NOT DETECTED NOT DETECTED   Staphylococcus species DETECTED (A) NOT DETECTED   Staphylococcus aureus (BCID) NOT DETECTED NOT DETECTED   Staphylococcus epidermidis DETECTED (A) NOT DETECTED   Staphylococcus lugdunensis NOT DETECTED NOT DETECTED   Streptococcus species NOT DETECTED NOT DETECTED   Streptococcus agalactiae NOT DETECTED NOT DETECTED   Streptococcus pneumoniae NOT DETECTED NOT DETECTED   Streptococcus pyogenes NOT DETECTED NOT DETECTED   A.calcoaceticus-baumannii NOT DETECTED NOT DETECTED   Bacteroides fragilis NOT DETECTED NOT DETECTED   Enterobacterales NOT DETECTED NOT DETECTED   Enterobacter cloacae complex NOT DETECTED NOT DETECTED   Escherichia coli NOT DETECTED NOT DETECTED   Klebsiella aerogenes NOT DETECTED NOT DETECTED   Klebsiella oxytoca NOT DETECTED NOT DETECTED   Klebsiella pneumoniae NOT DETECTED NOT DETECTED   Proteus species NOT DETECTED NOT DETECTED   Salmonella species NOT DETECTED NOT DETECTED   Serratia marcescens NOT DETECTED NOT DETECTED   Haemophilus influenzae NOT DETECTED NOT DETECTED   Neisseria meningitidis NOT DETECTED NOT  DETECTED   Pseudomonas aeruginosa NOT DETECTED NOT DETECTED   Stenotrophomonas maltophilia NOT DETECTED NOT DETECTED   Candida albicans NOT DETECTED NOT DETECTED   Candida auris NOT DETECTED NOT DETECTED   Candida glabrata NOT DETECTED NOT DETECTED   Candida krusei NOT DETECTED NOT DETECTED   Candida parapsilosis NOT DETECTED NOT DETECTED   Candida tropicalis NOT DETECTED NOT DETECTED   Cryptococcus neoformans/gattii NOT DETECTED NOT DETECTED   Methicillin resistance mecA/C DETECTED (A) NOT DETECTED    [name], Gabriel John, PharmD 04/03/2024  12:42 AM

## 2024-04-03 NOTE — Progress Notes (Signed)
 PROGRESS NOTE    [name]  ZOX:[id number] DOB: 07-13-1951 DOA: 03/30/2024 PCP: Dorothe Gaster, NP   Brief Narrative:  This 73 yrs old female with PMH significant for cecal cancer status post hemicolectomy, chemo which induced liver cirrhosis with portal hypertension, EVs s/p ligation, s/p TIPS 2012, prior hepatic encephalopathy, thrombocytopenia.  Patient was brought to the ED for worsening mental status.  Patient takes lactulose  3 times daily at home whenever her confusion worsen then husband increases the dose of lactulose  and also gives her rifaximin  for few days.  Patient continued to remain confused despite being given increased dose of lactulose  and rifaximin .  Patient has not had a bowel movement in last 4 days.  In the interval patient became very confused , disoriented and sleepy.  She was hypothermic in the ED with ammonia level 107.  Patient was admitted for further evaluation. Patient was started on Bair hugger for hypothermia.  She was also given dextrose , insulin  and Lokelma  for hyperkalemia.  Patient was admitted for further evaluation.  Assessment & Plan:   Active Problems:   Acute hepatic encephalopathy (HCC)  Acute hepatic encephalopathy: > Improved. Patient presented with progressive confusion, somnolence and no bowel movement in last 4 days. Ammonia level significantly elevated to over 100 on arrival. Continue Lactulose  and rifaximin  . Repeat ammonia level improved to 53> 43 >35. Mental status has significantly improved.   Continue lactulose  enema and switch to oral lactulose  if she is able to take orally. Started lactulose  30 g PO TID, Xifaxan  550 PO BID. Patient started throwing up again, change to lactulose  enema.   Acute hyperkalemia: > Resolved. Potassium significantly elevated to 6.0  No EKG changes. Likely secondary to Aldactone . She was given dextrose , insulin , Lokelma , sodium bicarbonate . Repeat potassium improved.  Aldactone  resumed.   Hypothermia -  r/o infection: Initial temperature was very low at 90.8.  She was placed on [employer]  Gradually improved temperature.   WBC count normal, lactic acid level normal.  No abdominal tenderness at all to suspect SBP. Denies urinary symptoms.  UA, blood cultures and chest x-ray unrevealing. No ascites and right upper quadrant ultrasound.  H/o liver cirrhosis with portal hypertension: s/p TIPS 2012 PTA meds-Aldactone  50 mg daily, Lasix  20 mg daily and additional 20 mg daily as needed Continue Lactulose  and Xifaxan . Does not seem to be in beta-blocker probably because of labile blood pressure. Resume Lasix  and Aldactone  as patient able to take oral.   Chronic macrocytic anemia: EVs s/p ligation Baseline hemoglobin more than 10.  Continue to monitor No obvious visible bleeding.   Thrombocytopenia Platelet low but stable.  Continue to monitor  H/o cecal cancer s/p hemicolectomy 2003 Currently in remission.   Impaired mobility: Uses cane at baseline May benefit from PT eval once improves clinically.   DVT prophylaxis: SCDs Code Status:Full code Family Communication: No family at bed side. Disposition Plan:    Status is: Inpatient Remains inpatient appropriate because: Admitted for hepatic encephalopathy.  Patient is PT and OT evaluation for disposition.  Consultants:  Gastroenterology  Procedures: None  Antimicrobials:  Anti-infectives (From admission, onward)    Start     Dose/Rate Route Frequency Ordered Stop   03/30/24 1900  rifaximin  (XIFAXAN ) tablet 550 mg        550 mg Oral 2 times daily 03/30/24 1800        Subjective: Patient was seen and examined at bedside. Overnight events noted. Patient appears much improved , back to her baseline.  She is following commands.   Ammonia level slightly up to 42. Patient has developed significant vomiting.   Objective: Vitals:   04/02/24 0429 04/02/24 1150 04/02/24 1957 04/03/24 0435  BP: 128/69 (!) 114/58 130/64 103/78   Pulse: 68 69 69 67  Resp: 16 20 18 19   Temp: 98.2 F (36.8 C) 97.8 F (36.6 C) 97.7 F (36.5 C) 98.4 F (36.9 C)  TempSrc: Oral Oral Oral Oral  SpO2: 97% 94% 97% 97%  Weight:      Height:        Intake/Output Summary (Last 24 hours) at 04/03/2024 1103 Last data filed at 04/02/2024 1700 Gross per 24 hour  Intake 360 ml  Output 200 ml  Net 160 ml   Filed Weights   03/30/24 1444 03/30/24 2242 04/01/24 1349  Weight: 65.8 kg 65.9 kg 63.5 kg    Examination:  General exam: Appears calm and comfortable, severely deconditioned, not in any distress. Respiratory system: CTA Bilaterally. Respiratory effort normal.  RR 14 Cardiovascular system: S1 & S2 heard, RRR. No JVD, murmurs, rubs, gallops or clicks.  Gastrointestinal system: Abdomen is non distended, soft and non tender. Normal bowel sounds heard. Central nervous system: Alert and oriented x 2. No focal neurological deficits. Extremities: No edema, no cyanosis, no clubbing. Skin: No rashes, lesions or ulcers Psychiatry: Mood & affect appropriate.     Data Reviewed: I have personally reviewed following labs and imaging studies  CBC: Recent Labs  Lab 03/30/24 1515 03/31/24 0021 04/01/24 0301 04/02/24 0420  WBC 4.4 3.0* 4.0 4.3  NEUTROABS 2.9  --   --   --   HGB 9.9* 8.5* 8.4* 8.2*  HCT 30.4* 27.0* 26.3* 26.4*  MCV 103.4* 108.0* 106.5* 108.6*  PLT 135* 107* 112* 106*   Basic Metabolic Panel: Recent Labs  Lab 03/30/24 1850 03/31/24 0021 03/31/24 0320 04/01/24 0301 04/02/24 0420  NA 133* 139 136 143 138  K 6.3* 4.9 5.1 4.6 4.2  CL 101 107 105 115* 112*  CO2 25 22 23  19* 20*  GLUCOSE 87 68* 78 79 76  BUN 27* 27* 27* 28* 20  CREATININE 0.98 1.15* 1.08* 1.14* 0.88  CALCIUM  9.1 9.1 8.8* 9.0 8.8*  MG  --  1.4*  --  1.7  --   PHOS  --  2.5  --  4.4  --    GFR: Estimated Creatinine Clearance: 49.9 mL/min (by C-G formula based on SCr of 0.88 mg/dL). Liver Function Tests: Recent Labs  Lab 03/30/24 1515  03/30/24 1850 03/31/24 0320 04/01/24 0301 04/02/24 0420  AST 105* 95* 83* 76* 74*  ALT 64* 57* 53* 49* 45*  ALKPHOS 154* 139* 126 112 113  BILITOT 1.1 1.2 1.5* 1.2 1.3*  PROT 7.2 6.5 6.0* 5.6* 5.9*  ALBUMIN  2.9* 2.6* 2.4* 2.3* 2.3*   No results for input(s): "LIPASE", "AMYLASE" in the last 168 hours. Recent Labs  Lab 03/30/24 1515 03/31/24 0021 04/01/24 0301 04/02/24 0420 04/03/24 0353  AMMONIA 107* 53* 43* 35 42*   Coagulation Profile: Recent Labs  Lab 03/30/24 1515 04/01/24 0301  INR 1.4* 1.5*   Cardiac Enzymes: No results for input(s): "CKTOTAL", "CKMB", "CKMBINDEX", "TROPONINI" in the last 168 hours. BNP (last 3 results) Recent Labs    08/10/23 0946  PROBNP 45.0   HbA1C: No results for input(s): "HGBA1C" in the last 72 hours. CBG: Recent Labs  Lab 04/02/24 1603 04/02/24 2055 04/03/24 0113 04/03/24 0433 04/03/24 0725  GLUCAP 93 122* 99 100* 88   Lipid  Profile: No results for input(s): "CHOL", "HDL", "LDLCALC", "TRIG", "CHOLHDL", "LDLDIRECT" in the last 72 hours. Thyroid  Function Tests: No results for input(s): "TSH", "T4TOTAL", "FREET4", "T3FREE", "THYROIDAB" in the last 72 hours.  Anemia Panel: No results for input(s): "VITAMINB12", "FOLATE", "FERRITIN", "TIBC", "IRON", "RETICCTPCT" in the last 72 hours. Sepsis Labs: Recent Labs  Lab 03/30/24 1947  LATICACIDVEN 0.9    Recent Results (from the past 240 hours)  Blood culture (routine x 2)     Status: None (Preliminary result)   Collection Time: 03/30/24  7:07 PM   Specimen: BLOOD RIGHT ARM  Result Value Ref Range Status   Specimen Description   Final    BLOOD RIGHT ARM Performed at [facility], [address], [place], [place] [address]    Special Requests   Final    BOTTLES DRAWN AEROBIC AND ANAEROBIC Blood Culture adequate volume Performed at [facility], [address] [place], [place] [address]    Culture  Setup Time   Final    GRAM POSITIVE  COCCI ANAEROBIC BOTTLE ONLY CRITICAL RESULT CALLED TO, READ BACK BY AND VERIFIED WITH: L POINDEXTER,PHARMD@0030  04/03/24 MK Performed at [facility], [address] [place], [place] [address]    Culture GRAM POSITIVE COCCI  Final   Report Status PENDING  Incomplete  Blood Culture ID Panel (Reflexed)     Status: Abnormal   Collection Time: 03/30/24  7:07 PM  Result Value Ref Range Status   Enterococcus faecalis NOT DETECTED NOT DETECTED Final   Enterococcus Faecium NOT DETECTED NOT DETECTED Final   Listeria monocytogenes NOT DETECTED NOT DETECTED Final   Staphylococcus species DETECTED (A) NOT DETECTED Final    Comment: CRITICAL RESULT CALLED TO, READ BACK BY AND VERIFIED WITH: L POINDEXTER,PHARMD@0030  04/03/24 MK    Staphylococcus aureus (BCID) NOT DETECTED NOT DETECTED Final   Staphylococcus epidermidis DETECTED (A) NOT DETECTED Final    Comment: Methicillin (oxacillin) resistant coagulase negative staphylococcus. Possible blood culture contaminant (unless isolated from more than one blood culture draw or clinical case suggests pathogenicity). No antibiotic treatment is indicated for blood  culture contaminants. CRITICAL RESULT CALLED TO, READ BACK BY AND VERIFIED WITH: L POINDEXTER,PHARMD@0030  04/03/24 MK    Staphylococcus lugdunensis NOT DETECTED NOT DETECTED Final   Streptococcus species NOT DETECTED NOT DETECTED Final   Streptococcus agalactiae NOT DETECTED NOT DETECTED Final   Streptococcus pneumoniae NOT DETECTED NOT DETECTED Final   Streptococcus pyogenes NOT DETECTED NOT DETECTED Final   A.calcoaceticus-baumannii NOT DETECTED NOT DETECTED Final   Bacteroides fragilis NOT DETECTED NOT DETECTED Final   Enterobacterales NOT DETECTED NOT DETECTED Final   Enterobacter cloacae complex NOT DETECTED NOT DETECTED Final   Escherichia coli NOT DETECTED NOT DETECTED Final   Klebsiella aerogenes NOT DETECTED NOT DETECTED Final   Klebsiella oxytoca NOT DETECTED NOT DETECTED  Final   Klebsiella pneumoniae NOT DETECTED NOT DETECTED Final   Proteus species NOT DETECTED NOT DETECTED Final   Salmonella species NOT DETECTED NOT DETECTED Final   Serratia marcescens NOT DETECTED NOT DETECTED Final   Haemophilus influenzae NOT DETECTED NOT DETECTED Final   Neisseria meningitidis NOT DETECTED NOT DETECTED Final   Pseudomonas aeruginosa NOT DETECTED NOT DETECTED Final   Stenotrophomonas maltophilia NOT DETECTED NOT DETECTED Final   Candida albicans NOT DETECTED NOT DETECTED Final   Candida auris NOT DETECTED NOT DETECTED Final   Candida glabrata NOT DETECTED NOT DETECTED Final   Candida krusei NOT DETECTED NOT DETECTED Final   Candida  parapsilosis NOT DETECTED NOT DETECTED Final   Candida tropicalis NOT DETECTED NOT DETECTED Final   Cryptococcus neoformans/gattii NOT DETECTED NOT DETECTED Final   Methicillin resistance mecA/C DETECTED (A) NOT DETECTED Final    Comment: CRITICAL RESULT CALLED TO, READ BACK BY AND VERIFIED WITH: L POINDEXTER,PHARMD@0030  04/03/24 MK Performed at [facility], [address], [place], [place] [address]   Blood culture (routine x 2)     Status: None (Preliminary result)   Collection Time: 03/30/24  7:38 PM   Specimen: BLOOD LEFT HAND  Result Value Ref Range Status   Specimen Description   Final    BLOOD LEFT HAND Performed at [facility], [address] [place], [place] [address]    Special Requests   Final    BOTTLES DRAWN AEROBIC AND ANAEROBIC Blood Culture adequate volume Performed at [facility], [address] [place], [place] [address]    Culture   Final    NO GROWTH 4 DAYS Performed at [facility], [address] [place], [place] [address]    Report Status PENDING  Incomplete  MRSA Next Gen by PCR, Nasal     Status: None   Collection Time: 03/30/24 10:11 PM   Specimen: Nasal Mucosa; Nasal Swab  Result Value Ref Range Status   MRSA by PCR Next Gen NOT DETECTED NOT DETECTED Final     Comment: (NOTE) The GeneXpert MRSA Assay (FDA approved for NASAL specimens only), is one component of a comprehensive MRSA colonization surveillance program. It is not intended to diagnose MRSA infection nor to guide or monitor treatment for MRSA infections. Test performance is not FDA approved in patients less than 25 years old. Performed at [facility], [address], [place], [place] [address]    Radiology Studies: No results found.   Scheduled Meds:  furosemide   20 mg Oral Daily   lactulose   30 g Oral TID   lactulose   300 mL Rectal TID   rifaximin   550 mg Oral BID   spironolactone   50 mg Oral Daily   Continuous Infusions:     LOS: 4 days    Time spent: 35 mins    Magdalene School, MD [facility]   If 7PM-7AM, please contact night-coverage

## 2024-04-03 NOTE — Progress Notes (Signed)
 Patient did well throughout the shift. Patient was able to tolerate oral medications, liquids, and some food without nausea or vomiting. Patient was able to participated with PT and sat up in the chair with increased alertness and more active in conversation.

## 2024-04-03 NOTE — Plan of Care (Signed)
   Problem: Education: Goal: Knowledge of General Education information will improve Description: Including pain rating scale, medication(s)/side effects and non-pharmacologic comfort measures Outcome: Progressing   Problem: Coping: Goal: Level of anxiety will decrease Outcome: Progressing

## 2024-04-03 NOTE — Progress Notes (Addendum)
 Progress Note   Subjective  Chief Complaint: Noncirrhotic portal hypertension and suspected Florentina Huntsman cirrhosis with esophageal varices and hepatic encephalopathy  Today, husband tells me he is concerned because the patient tried solid food for the first time at lunch yesterday and ate 2 bites of a grilled chicken pizza and proceeded to become nauseous and vomit multiple times, this meant that she missed 2 doses of her lactulose  yesterday, she did not feel like eating even clear broth last night and had about 3 more episodes of vomiting in the evening.  She has not had a bowel movement since yesterday.  As of this morning she is only drank about 1/4 cup of water.  Not experiencing any abdominal pain.  Husband is worried about what to do when he takes her home.  Apparently the hospitalist had mentioned discharge within the next 48 hours.  This seems to be an endless cycle where she all of a sudden just stops defecating and then stops drinking and then becomes encephalopathic at home.  They are unable to afford chronic Xifaxan  given that it is a thousand dollars per month for them.  They do have follow-up with [facility] in June but are just trying to get there.  Also know she needs a revision of her TIPS.  Per husband she is still not back to her baseline mentally.   Objective   Vital signs in last 24 hours: Temp:  [97.7 F (36.5 C)-98.4 F (36.9 C)] 98.4 F (36.9 C) (05/12 0435) Pulse Rate:  [67-69] 67 (05/12 0435) Resp:  [18-20] 19 (05/12 0435) BP: (103-130)/(58-78) 103/78 (05/12 0435) SpO2:  [94 %-97 %] 97 % (05/12 0435) Last BM Date : 04/02/24 General:    chronically ill appearing white female in NAD Heart:  Regular rate and rhythm; no murmurs Lungs: Respirations even and unlabored, lungs CTA bilaterally Abdomen:  Soft, nontender and nondistended. Normal bowel sounds. Psych:  Cooperative. Normal mood and affect. +no asterixis  Intake/Output from previous day: 05/11 0701 - 05/12  0700 In: 720 [P.O.:720] Out: 370 [Emesis/NG output:200; Stool:170]  Lab Results: Recent Labs    04/01/24 0301 04/02/24 0420  WBC 4.0 4.3  HGB 8.4* 8.2*  HCT 26.3* 26.4*  PLT 112* 106*   BMET Recent Labs    04/01/24 0301 04/02/24 0420  NA 143 138  K 4.6 4.2  CL 115* 112*  CO2 19* 20*  GLUCOSE 79 76  BUN 28* 20  CREATININE 1.14* 0.88  CALCIUM  9.0 8.8*   LFT Recent Labs    04/02/24 0420  PROT 5.9*  ALBUMIN  2.3*  AST 74*  ALT 45*  ALKPHOS 113  BILITOT 1.3*   PT/INR Recent Labs    04/01/24 0301  LABPROT 18.3*  INR 1.5*    Assessment / Plan:   Assessment: 1.  Noncirrhotic portal hypertension and suspected NASH cirrhosis: With esophageal varices status post variceal hemorrhage in 2012 status post TIPS, thrombocytopenia, hepatic encephalopathy, now with hepatic encephalopathy that is lingering over the past week with unclear etiology, currently on Lactulose  and Xifaxan  2.  Hyperkalemia: Resolved after Lokelma , question if induced by Spironolactone  versus adrenal insufficiency, but morning cortisol is normal 3.  Macrocytic anemia: Stable hemoglobin 4.  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  History of adenocarcinoma of the cecum: Stage IIIb, T3 N1 with lymph node mass measuring 4 cm, status post hemicolectomy and adjuvant FOLFOX x 12 cycles from 3/15/4-11/1/4  Plan: 1.  Continue current medications, will switch Lactulose  to enemas given that  patient now has nausea and vomiting 2.  Continue Xifaxan  and Lasix  as well as Spironolactone  3.  Continue Zofran  for nausea and vomiting 4.  Patient to remain on clear liquid diet for now as tolerated 5.  Will discuss case with Dr. Willy Harvest, please await further recommendations later this afternoon.  Patient is certainly not ready for discharge at this point.  Thank you for your kind consultation, we will continue to follow.   LOS: 4 days   Graciella Lavender  04/03/2024, 9:46 AM  Gastroenterology rounding physician:  I have also seen  and evaluated the patient and reviewed the above note in the records.  Spoke to husband as well.  I have communicated with endocrinology and the Cortrosyn  stimulation test on April 14 was normal as is a.m. cortisol so we do not think adrenal insufficiency is a problem.   She is worse today and after I saw her yesterday with more somnolence and she was vomiting some.  However the vomiting abated and she did eat some potato for lunch.  She is drinking small amounts of fluid.  We have resumed the lactulose  enema.  She does not have asterixis but she is intermittently somnolent.  No bowel movement so far today either.  I will check a portable KUB.  Have explained to the husband that my working diagnosis is still hepatic encephalopathy but with history of strokes and recurrent somnolence question if she has had a recurrent event.    Note that 1 set of blood culture from the right arm had Staph epidermidis the left hand culture did not so probably contaminant.  Could need goals of care discussion.  She was hypothermic when she was admitted in January as well.  Kenney Peacemaker, MD, [facility] [place] [facility] See Tilford Foley on call - gastroenterology for best contact person 04/03/2024 3:33 PM

## 2024-04-03 NOTE — Progress Notes (Signed)
 Per night shift RN, patient vomited at least three times during the shift. Night shift RN reached out to on-call provider for PRN Zofran . Night shift RN gave patient PRN IV Zofran  but  it did not help. Patient not really eating or drinking.

## 2024-04-03 NOTE — Evaluation (Signed)
 Physical Therapy Evaluation Patient Details Name: [name] MRN: [id number] DOB: Dec 14, 1950 Today's Date: 04/03/2024  History of Present Illness  73 yo female admitted with hepatic encephalopathy, hypotension, vomiting. Hx of L1 comp fx, cecal adenocarcinoma s/p hemicolectomy, CVA, liver cirrhosis  Clinical Impression  On eval, pt required Min A for mobility. She walked ~90 feet with a RW. Pt presents with general weakness, decreased activity tolerance, and impaired gait and balance. Pt exhibits some mild ataxia with ambulation. She denied dizziness during session. Will plan to follow pt during this hospital stay. Will recommend HHPT f/u after discharge. Pt would benefit from daily OOB>chair/ambulation with nursing/mobility team as able.         If plan is discharge home, recommend the following: A little help with walking and/or transfers;A little help with bathing/dressing/bathroom;Assistance with cooking/housework;Assist for transportation;Help with stairs or ramp for entrance   Can travel by private vehicle        Equipment Recommendations None recommended by PT  Recommendations for Other Services       Functional Status Assessment Patient has had a recent decline in their functional status and demonstrates the ability to make significant improvements in function in a reasonable and predictable amount of time.     Precautions / Restrictions Precautions Precautions: Fall Restrictions Weight Bearing Restrictions Per Provider Order: No      Mobility  Bed Mobility Overal bed mobility: Needs Assistance Bed Mobility: Supine to Sit     Supine to sit: Contact guard, HOB elevated     General bed mobility comments: increased time. cues provided.    Transfers Overall transfer level: Needs assistance Equipment used: Rolling walker (2 wheels) Transfers: Sit to/from Stand, Bed to chair/wheelchair/BSC Sit to Stand: Min assist   Step pivot transfers: Min assist        General transfer comment: Assist to rise, steady, control descent, manage RW during pivot. Mild posterior bias. Increased time.    Ambulation/Gait Ambulation/Gait assistance: Min assist Gait Distance (Feet): 90 Feet Assistive device: Rolling walker (2 wheels) Gait Pattern/deviations: Step-through pattern, Decreased stride length       General Gait Details: Assist to stabilize throughout distance. Cues for safety, proper use of RW. Tolerated distance well. Pt denied dizziness, reported some fatigue.  Stairs            Wheelchair Mobility     Tilt Bed    Modified Rankin (Stroke Patients Only)       Balance Overall balance assessment: Needs assistance         Standing balance support: Bilateral upper extremity supported, During functional activity, Reliant on assistive device for balance Standing balance-Leahy Scale: Poor                               Pertinent Vitals/Pain Pain Assessment Pain Assessment: No/denies pain    Home Living Family/patient expects to be discharged to:: Private residence Living Arrangements: Spouse/significant other Available Help at Discharge: Family;Available PRN/intermittently Type of Home: House Home Access: Ramped entrance       Home Layout: One level Home Equipment: Rolling Walker (2 wheels);BSC/3in1;Cane - single point;Wheelchair - manual;Hospital bed;Tub bench      Prior Function Prior Level of Function : Independent/Modified Independent             Mobility Comments: 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 Husband does all driving and helps with cooking. Had long discussion  about pt doing the bills. When she is not ecephalopathic she does ok, but husband has to help when she is and currently does not have access to some of bank accounts.  Recommended them looking into this so if things are difficult for her as her cogntion changes, he can access  accounts to be able to pay the bills.     Extremity/Trunk Assessment   Upper Extremity Assessment Upper Extremity Assessment: Defer to OT evaluation    Lower Extremity Assessment Lower Extremity Assessment: Generalized weakness    Cervical / Trunk Assessment Cervical / Trunk Assessment: Normal  Communication   Communication Communication: No apparent difficulties    Cognition Arousal: Alert Behavior During Therapy: WFL for tasks assessed/performed   PT - Cognitive impairments: Problem solving, Safety/Judgement, Memory                       PT - Cognition Comments: participated well. Following commands: Intact       Cueing Cueing Techniques: Verbal cues     General Comments      Exercises     Assessment/Plan    PT Assessment Patient needs continued PT services  PT Problem List Decreased strength;Decreased range of motion;Decreased activity tolerance;Decreased balance;Decreased mobility;Decreased knowledge of use of DME       PT Treatment Interventions DME instruction;Gait training;Functional mobility training;Therapeutic activities;Therapeutic exercise;Patient/family education;Balance training    PT Goals (Current goals can be found in the Care Plan section)  Acute Rehab PT Goals Patient Stated Goal: to get better PT Goal Formulation: With patient/family Time For Goal Achievement: 04/17/24 Potential to Achieve Goals: Good    Frequency Min 3X/week     Co-evaluation               AM-PAC PT "6 Clicks" Mobility  Outcome Measure Help needed turning from your back to your side while in a flat bed without using bedrails?: A Little Help needed moving from lying on your back to sitting on the side of a flat bed without using bedrails?: A Little Help needed moving to and from a bed to a chair (including a wheelchair)?: A Little Help needed standing up from a chair using your arms (e.g., wheelchair or bedside chair)?: A Little Help needed to walk  in hospital room?: A Little Help needed climbing 3-5 steps with a railing? : A Lot 6 Click Score: 17    End of Session Equipment Utilized During Treatment: Gait belt Activity Tolerance: Patient tolerated treatment well Patient left: in chair;with call bell/phone within reach;with chair alarm set;with family/visitor present   PT Visit Diagnosis: Muscle weakness (generalized) (M62.81);Difficulty in walking, not elsewhere classified (R26.2)    Time: 1610-9604 PT Time Calculation (min) (ACUTE ONLY): 21 min   Charges:   PT Evaluation $PT Eval Low Complexity: 1 Low   PT General Charges $$ ACUTE PT VISIT: 1 Visit            Tanda Falter, PT Acute Rehabilitation  Office: [phone]

## 2024-04-04 DIAGNOSIS — K7682 Hepatic encephalopathy: Secondary | ICD-10-CM | POA: Diagnosis not present

## 2024-04-04 LAB — CBC
HCT: 25.5 % — ABNORMAL LOW (ref 36.0–46.0)
Hemoglobin: 7.9 g/dL — ABNORMAL LOW (ref 12.0–15.0)
MCH: 33.8 pg (ref 26.0–34.0)
MCHC: 31 g/dL (ref 30.0–36.0)
MCV: 109 fL — ABNORMAL HIGH (ref 80.0–100.0)
Platelets: 123 10*3/uL — ABNORMAL LOW (ref 150–400)
RBC: 2.34 MIL/uL — ABNORMAL LOW (ref 3.87–5.11)
RDW: 17.9 % — ABNORMAL HIGH (ref 11.5–15.5)
WBC: 3.7 10*3/uL — ABNORMAL LOW (ref 4.0–10.5)
nRBC: 0 % (ref 0.0–0.2)

## 2024-04-04 LAB — PHOSPHORUS: Phosphorus: 3.5 mg/dL (ref 2.5–4.6)

## 2024-04-04 LAB — CULTURE, BLOOD (ROUTINE X 2)
Culture  Setup Time: NO GROWTH
Culture: NO GROWTH
Special Requests: ADEQUATE
Special Requests: ADEQUATE

## 2024-04-04 LAB — BASIC METABOLIC PANEL WITH GFR
Anion gap: 8 (ref 5–15)
BUN: 21 mg/dL (ref 8–23)
CO2: 21 mmol/L — ABNORMAL LOW (ref 22–32)
Calcium: 8.6 mg/dL — ABNORMAL LOW (ref 8.9–10.3)
Chloride: 107 mmol/L (ref 98–111)
Creatinine, Ser: 1.08 mg/dL — ABNORMAL HIGH (ref 0.44–1.00)
GFR, Estimated: 55 mL/min — ABNORMAL LOW (ref >60)
Glucose, Bld: 73 mg/dL (ref 70–99)
Potassium: 3.6 mmol/L (ref 3.5–5.1)
Sodium: 136 mmol/L (ref 135–145)

## 2024-04-04 LAB — GLUCOSE, CAPILLARY
Glucose-Capillary: 100 mg/dL — ABNORMAL HIGH (ref 70–99)
Glucose-Capillary: 100 mg/dL — ABNORMAL HIGH (ref 70–99)
Glucose-Capillary: 68 mg/dL — ABNORMAL LOW (ref 70–99)
Glucose-Capillary: 74 mg/dL (ref 70–99)
Glucose-Capillary: 86 mg/dL (ref 70–99)
Glucose-Capillary: 87 mg/dL (ref 70–99)
Glucose-Capillary: 93 mg/dL (ref 70–99)

## 2024-04-04 LAB — MAGNESIUM: Magnesium: 1.5 mg/dL — ABNORMAL LOW (ref 1.7–2.4)

## 2024-04-04 LAB — AMMONIA: Ammonia: 49 umol/L — ABNORMAL HIGH (ref 9–35)

## 2024-04-04 MED ORDER — MAGNESIUM SULFATE 2 GM/50ML IV SOLN
2.0000 g | Freq: Once | INTRAVENOUS | Status: AC
  Administered 2024-04-04: 2 g via INTRAVENOUS
  Filled 2024-04-04: qty 50

## 2024-04-04 NOTE — Progress Notes (Addendum)
 Progress Note   Subjective  Chief Complaint: Noncirrhotic portal hypertension and suspected NASH cirrhosis with esophageal varices and hepatic encephalopathy  Today, things are much better, in fact the patient's husband tells me that as soon as Dr. Willy Harvest left the room the patient seemed to wake up, was able to ambulate down the hall with physical therapy and sat up in her chair until about 8 PM, developed an appetite around noon and had some bites of potato and able to eat dinner, had a slightly rough night with a drop in her temperature requiring multiple blankets and an increase in heat in the room, she then recovered.  As of this morning enjoying a hearty omelette.  She is much more awake and alert.  Per husband still has some "flapping" of her hands but in general he is very happy with the progress she has made.    Objective   Vital signs in last 24 hours: Temp:  [97.3 F (36.3 C)-98.8 F (37.1 C)] 98.8 F (37.1 C) (05/13 0400) Pulse Rate:  [54-69] 69 (05/13 0400) Resp:  [16-20] 20 (05/13 0400) BP: (101-122)/(55-73) 101/55 (05/13 0400) SpO2:  [94 %-99 %] 94 % (05/13 0400) Last BM Date : 04/02/24 General:    Chronically ill-appearing white female in NAD Heart:  Regular rate and rhythm; no murmurs Lungs: Respirations even and unlabored, lungs CTA bilaterally Abdomen:  Soft, nontender and nondistended. Normal bowel sounds. Neurologic:  Alert and oriented,  grossly normal neurologically.+ No asterixis Psych:  Cooperative. Normal mood and affect.  Intake/Output from previous day: 05/12 0701 - 05/13 0700 In: 720 [P.O.:720] Out: 1150 [Urine:1150]  Lab Results: Recent Labs    04/02/24 0420 04/04/24 0353  WBC 4.3 3.7*  HGB 8.2* 7.9*  HCT 26.4* 25.5*  PLT 106* 123*   BMET Recent Labs    04/02/24 0420 04/04/24 0353  NA 138 136  K 4.2 3.6  CL 112* 107  CO2 20* 21*  GLUCOSE 76 73  BUN 20 21  CREATININE 0.88 1.08*  CALCIUM  8.8* 8.6*   LFT Recent Labs     04/02/24 0420  PROT 5.9*  ALBUMIN  2.3*  AST 74*  ALT 45*  ALKPHOS 113  BILITOT 1.3*   Studies/Results: DG Abd Portable 1V Result Date: 04/03/2024 CLINICAL DATA:  Constipation, nausea and vomiting. EXAM: PORTABLE ABDOMEN - 1 VIEW COMPARISON:  None Available. FINDINGS: Gas is seen in nondistended small bowel and colon. Transjugular intrahepatic portosystemic shunt in the right upper quadrant. Probable linear scarring in the costophrenic angle. IMPRESSION: No acute findings. Electronically Signed   By: Shearon Denis M.D.   On: 04/03/2024 19:38    Assessment / Plan:   Assessment: 1.  Noncirrhotic portal hypertension and suspected NASH cirrhosis: With esophageal varices status post variceal hemorrhage in 2012 status post TIPS, thrombocytopenia, hepatic encephalopathy, now with hepatic encephalopathy that is lingering over the past week with unclear etiology, though improved today, currently on Lactulose  and Xifaxan , KUB normal yesterday 2.  Hyperkalemia: Resolved after Lokelma , question of induced by spironolactone , adrenal insufficiency is not suspected given negative testing 3.  Macrocytic anemia: Hemoglobin has been slowly drifting down over the past few days 4.  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  History of adenocarcinoma of the cecum: Stage IIIb, T3 N1 with lymph node mass measuring 4 cm, status post hemicolectomy and adjuvant FOLFOX x 12 cycles from 02/05/2023-09/24/2023  Plan: 1.  Continue current medications, lactulose  has been changed back to oral 2.  Continue Xifaxan  and Lasix  as  well spironolactone  3.  Patient can continue on her current diet as tolerated 4.  KUB reviewed with the patient, no significant stool or other abnormality. 5.  If today continues to go well and patient able to ambulate and hopefully have a bowel movement then she may be looking at discharge tomorrow.  Will need to figure out a plan for her going home though, not sure if this means more daily lactulose  in general or  something else, they cannot afford Xifaxan .  Thank you for your kind consultation, we will continue to follow.    LOS: 5 days   Graciella Lavender  04/04/2024, 9:28 AM  Gastroenterology inpatient rounding physician:  I have seen and evaluated the patient in person and reviewed the chart.  As above patient is improved today.  Discussion with husband about lactulose  dosing at home.  She does not move her bowels but every other day though he says it is a large bowel movement when she has it and he feels like if he keeps removing and drinking liquids lactulose  at 3 tablespoons (30 g) 3 times daily generally does the job.  Xifaxan  has been unaffordable.  He even looked into [place] but that was apparently unaffordable though I searched today and found 100 capsules of 550 mg in the $100-$200 ranges depending upon the pharmacy.  He is encouraged to look into that and discussed with [facility] NP).  They have follow-up with her in about 1 month.  Kenney Peacemaker, MD, [facility] See Tilford Foley on call - gastroenterology for best contact person 04/04/2024 4:17 PM

## 2024-04-04 NOTE — TOC Progression Note (Signed)
 Transition of Care [facility]) - Progression Note   Patient Details  Name: [name] MRN: [id number] Date of Birth: 20-Apr-1951  [facility]) CM/SW Contact  Zenon Hilda, LCSW Phone Number: 04/04/2024, 2:09 PM  Clinical Narrative: PT evaluation recommended HH. CSW met with patient's spouse, Latrina Marcone, to discuss recommendations as patient was sleeping. Per spouse, he would prefer for the patient to do OPPT rather than HHPT as the patient has done OP before and did well with it. Husband requested that referral be made to [facility]. CSW made OPPT referral.  Expected Discharge Plan: OP Rehab Barriers to Discharge: Continued Medical Work up  Expected Discharge Plan and Services In-house Referral: Clinical Social Work Discharge Planning Services: NA Living arrangements for the past 2 months: Single Family Home             DME Arranged: N/A DME Agency: NA HH Arranged: Refused HH HH Agency: NA  Social Determinants of Health (SDOH) Interventions SDOH Screenings   Food Insecurity: No Food Insecurity (03/30/2024)  Housing: Low Risk  (03/30/2024)  Transportation Needs: No Transportation Needs (03/30/2024)  Utilities: Not At Risk (03/30/2024)  Alcohol Screen: Low Risk  (03/27/2024)  Depression (PHQ2-9): Low Risk  (03/27/2024)  Financial Resource Strain: Low Risk  (03/27/2024)  Physical Activity: Insufficiently Active (03/27/2024)  Social Connections: Patient Unable To Answer (03/30/2024)  Recent Concern: Social Connections - Moderately Isolated (03/30/2024)  Stress: No Stress Concern Present (03/27/2024)  Tobacco Use: Low Risk  (04/02/2024)   Readmission Risk Interventions    03/31/2024    2:51 PM 12/23/2023    2:25 PM 04/27/2023    2:11 PM  Readmission Risk Prevention Plan  Transportation Screening Complete Complete Complete  PCP or Specialist Appt within 5-7 Days  Complete   PCP or Specialist Appt within 3-5 Days Complete    Home Care Screening  Complete   Medication Review (RN CM)   Complete   HRI or Home Care Consult Complete    Social Work Consult for Recovery Care Planning/Counseling Complete    Palliative Care Screening Not Applicable    Medication Review Oceanographer) Complete  Complete  PCP or Specialist appointment within 3-5 days of discharge   Complete  HRI or Home Care Consult   Complete  SW Recovery Care/Counseling Consult   Complete  Palliative Care Screening   Not Applicable  Skilled Nursing Facility   Not Applicable

## 2024-04-04 NOTE — Evaluation (Signed)
 Occupational Therapy Evaluation Patient Details Name: [name] MRN: [id number] DOB: Apr 18, 1951 Today's Date: 04/04/2024   History of Present Illness   73 yo female admitted with hepatic encephalopathy, hypotension, vomiting. Hx of L1 comp fx, cecal adenocarcinoma s/p hemicolectomy, CVA, liver cirrhosis     Clinical Impressions PTA, patient was living at home with husband requiring higher level assist with IADL's but otherwise mod I with BADL's and mobility. Currently, patient presents with deficits outline below (see OT Problem list for details) most significantly decreased activity tolerance, generalized strength, balance and higher level cog/processing skills impacting BADL's and functional mobility. Patient has all necessary DME in home and would benefit from [facility] services with family support and assistance upon discharge. OT will continue to follow acutely to progress functional safety and performance level.     If plan is discharge home, recommend the following:   A little help with walking and/or transfers;A little help with bathing/dressing/bathroom;Assistance with cooking/housework;Direct supervision/assist for medications management;Direct supervision/assist for financial management;Assist for transportation;Help with stairs or ramp for entrance;Supervision due to cognitive status     Functional Status Assessment   Patient has had a recent decline in their functional status and demonstrates the ability to make significant improvements in function in a reasonable and predictable amount of time.     Equipment Recommendations   None recommended by OT      Precautions/Restrictions   Precautions Precautions: Fall Restrictions Weight Bearing Restrictions Per Provider Order: No     Mobility Bed Mobility Overal bed mobility:  (was up in recliner)                  Transfers Overall transfer level: Needs assistance Equipment used: Rolling walker (2  wheels) Transfers: Sit to/from Stand, Bed to chair/wheelchair/BSC Sit to Stand: Contact guard assist     Step pivot transfers: Contact guard assist     General transfer comment: min cues for hand placement with icreased time      Balance Overall balance assessment: Needs assistance Sitting-balance support: Feet supported Sitting balance-Leahy Scale: Good     Standing balance support: Bilateral upper extremity supported, During functional activity, Reliant on assistive device for balance Standing balance-Leahy Scale: Poor                             ADL either performed or assessed with clinical judgement   ADL Overall ADL's : Needs assistance/impaired Eating/Feeding: Independent;Sitting   Grooming: Set up;Wash/dry hands;Wash/dry face;Oral care;Brushing hair;Sitting   Upper Body Bathing: Set up;Sitting   Lower Body Bathing: Minimal assistance;Sitting/lateral leans;Sit to/from stand   Upper Body Dressing : Set up;Sitting   Lower Body Dressing: Minimal assistance;Sitting/lateral leans;Sit to/from stand   Toilet Transfer: Museum/gallery conservator (4 wheels) Toilet Transfer Details (indicate cue type and reason): min cues for hand placement Toileting- Clothing Manipulation and Hygiene: Contact guard assist Toileting - Clothing Manipulation Details (indicate cue type and reason): stood for peri and buttocks hygiene     Functional mobility during ADLs: Contact guard assist;Rolling walker (2 wheels) General ADL Comments: increased time for processing and rest breaks needed     Vision Baseline Vision/History: 1 Wears glasses Ability to See in Adequate Light: 0 Adequate Patient Visual Report: No change from baseline Vision Assessment?: Yes;Wears glasses for reading     Perception Perception: Within Functional Limits       Praxis Praxis: [facility]       Pertinent Vitals/Pain Pain Assessment Pain Assessment:  Faces Faces Pain Scale: Hurts a little  bit Pain Location: knees Pain Descriptors / Indicators: Sore Pain Intervention(s): Monitored during session, Repositioned     Extremity/Trunk Assessment Upper Extremity Assessment Upper Extremity Assessment: Right hand dominant;Generalized weakness   Lower Extremity Assessment Lower Extremity Assessment: Generalized weakness   Cervical / Trunk Assessment Cervical / Trunk Assessment: Normal   Communication Communication Communication: No apparent difficulties   Cognition Arousal: Alert Behavior During Therapy: WFL for tasks assessed/performed Cognition: Cognition impaired     Awareness: Online awareness impaired   Attention impairment (select first level of impairment): Alternating attention Executive functioning impairment (select all impairments): Problem solving, Initiation OT - Cognition Comments: slower processing, some intermittent confusion reported by husband but none noted this session                 Following commands: Intact       Cueing  General Comments   Cueing Techniques: Verbal cues  SpO2 remained 96-98% on RA, improved amb distance to 100 feet this session with RW, CGA and husband managing IV pole           Home Living Family/patient expects to be discharged to:: Private residence Living Arrangements: Spouse/significant other Available Help at Discharge: Family;Available PRN/intermittently Type of Home: House Home Access: Ramped entrance     Home Layout: One level     Bathroom Shower/Tub: Tub/shower unit;Curtain   Bathroom Toilet: Standard Bathroom Accessibility: Yes How Accessible: Accessible via walker Home Equipment: Rolling Walker (2 wheels);BSC/3in1;Cane - single point;Wheelchair - manual;Hospital bed;Tub bench   Additional Comments: walker basket      Prior Functioning/Environment Prior Level of Function : Independent/Modified Independent             Mobility Comments: 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 Husband does all driving and helps with cooking. Had long discussion about pt doing the bills. When she is not ecephalopathic she does ok, but husband has to help when she is and currently does not have access to some of bank accounts.  Recommended them looking into this so if things are difficult for her as her cogntion changes, he can access accounts to be able to pay the bills.    OT Problem List: Decreased strength;Decreased activity tolerance;Impaired balance (sitting and/or standing);Decreased cognition;Pain   OT Treatment/Interventions: Self-care/ADL training;Energy conservation;Neuromuscular education;Therapeutic exercise;DME and/or AE instruction;Therapeutic activities;Cognitive remediation/compensation;Balance training;Patient/family education      OT Goals(Current goals can be found in the care plan section)   Acute Rehab OT Goals Patient Stated Goal: to continue to be able to do more OT Goal Formulation: With patient/family Time For Goal Achievement: 04/18/24 Potential to Achieve Goals: Good ADL Goals Pt Will Perform Lower Body Bathing: with set-up;sitting/lateral leans;sit to/from stand Pt Will Perform Lower Body Dressing: with supervision;sitting/lateral leans;sit to/from stand;with adaptive equipment Pt Will Transfer to Toilet: with supervision;ambulating Pt Will Perform Toileting - Clothing Manipulation and hygiene: with supervision Pt Will Perform Tub/Shower Transfer: with contact guard assist;tub bench;grab bars Pt/caregiver will Perform Home Exercise Program: Increased strength;Both right and left upper extremity;With written HEP provided;Independently   OT Frequency:  Min 2X/week    AM-PAC OT "6 Clicks" Daily Activity     Outcome Measure Help from another person eating meals?: None Help from another person taking care of personal grooming?: A Little Help from another person toileting, which includes using toliet,  bedpan, or urinal?: A Little Help from another person bathing (including washing, rinsing, drying)?: A Little  Help from another person to put on and taking off regular upper body clothing?: A Little Help from another person to put on and taking off regular lower body clothing?: A Little 6 Click Score: 19   End of Session Equipment Utilized During Treatment: Gait belt;Rolling walker (2 wheels) Nurse Communication: Mobility status  Activity Tolerance: Patient tolerated treatment well Patient left: in chair;with call bell/phone within reach;with chair alarm set;with family/visitor present  OT Visit Diagnosis: Unsteadiness on feet (R26.81);Muscle weakness (generalized) (M62.81);Cognitive communication deficit (R41.841);Pain Symptoms and signs involving cognitive functions:  (encephalopathy)                Time: 1610-9604 OT Time Calculation (min): 50 min Charges:  OT General Charges $OT Visit: 1 Visit OT Evaluation $OT Eval Moderate Complexity: 1 Mod OT Treatments $Self Care/Home Management : 23-37 mins  Ramie Erman OT/L Acute Rehabilitation Department  [phone]  04/04/2024, 12:23 PM

## 2024-04-04 NOTE — Progress Notes (Signed)
 PROGRESS NOTE    [name]  NWG:[id number] DOB: 14-Mar-1951 DOA: 03/30/2024 PCP: Dorothe Gaster, NP   Brief Narrative:  This 73 yrs old female with PMH significant for cecal cancer status post hemicolectomy, chemo which induced liver cirrhosis with portal hypertension, EVs s/p ligation, s/p TIPS 2012, prior hepatic encephalopathy, thrombocytopenia.  Patient was brought to the ED for worsening mental status.  Patient takes lactulose  3 times daily at home whenever her confusion worsen then husband increases the dose of lactulose  and also gives her rifaximin  for few days.  Patient continued to remain confused despite being given increased dose of lactulose  and rifaximin .  Patient has not had a bowel movement in last 4 days.  In the interval patient became very confused , disoriented and sleepy.  She was hypothermic in the ED with ammonia level 107.  Patient was admitted for further evaluation. Patient was started on Bair hugger for hypothermia.  She was also given dextrose , insulin  and Lokelma  for hyperkalemia.  Patient was admitted for further evaluation.  Assessment & Plan:   Active Problems:   Acute hepatic encephalopathy (HCC)  Acute hepatic encephalopathy: > Improved. Patient presented with progressive confusion, somnolence and no bowel movement in last 4 days. Ammonia level significantly elevated to over 100 on arrival. Continue Lactulose  and rifaximin  . Repeat ammonia level improved to 53> 43 >35. Mental status has significantly improved.   Continue lactulose  enema and switch to oral lactulose  if she is able to take orally. Started lactulose  30 g PO TID, Xifaxan  550 PO BID. Patient started throwing up again, change to lactulose  enema. Lactulose  changed to oral tid today.   Acute hyperkalemia: > Resolved. Potassium significantly elevated to 6.0  No EKG changes. Likely secondary to Aldactone . She was given dextrose , insulin , Lokelma , sodium bicarbonate . Repeat potassium improved.   Aldactone  resumed.   Hypothermia - r/o infection: Initial temperature was very low at 90.8.  She was placed on [employer]  Gradually improved temperature.   WBC count normal, lactic acid level normal.  No abdominal tenderness at all to suspect SBP. Denies urinary symptoms.  UA, blood cultures and chest x-ray unrevealing. No ascites and right upper quadrant ultrasound.  H/o liver cirrhosis with portal hypertension: s/p TIPS 2012 PTA meds-Aldactone  50 mg daily, Lasix  20 mg daily and additional 20 mg daily as needed Continue Lactulose  and Xifaxan . Does not seem to be in beta-blocker probably because of labile blood pressure. Resume Lasix  and Aldactone  as patient able to take oral.   Chronic macrocytic anemia: EVs s/p ligation Baseline hemoglobin more than 10.  Continue to monitor No obvious visible bleeding.   Thrombocytopenia Platelet low but stable.  Continue to monitor  H/o cecal cancer s/p hemicolectomy 2003 Currently in remission.   Impaired mobility: Uses cane at baseline May benefit from PT eval once improves clinically.  Hypomagnesemia: Replaced.  Continue to monitor.   DVT prophylaxis: SCDs Code Status:Full code Family Communication: No family at bed side. Disposition Plan:    Status is: Inpatient Remains inpatient appropriate because: Admitted for hepatic encephalopathy.   PT and OT recommended home health services for disposition  Consultants:  Gastroenterology  Procedures: None  Antimicrobials:  Anti-infectives (From admission, onward)    Start     Dose/Rate Route Frequency Ordered Stop   03/30/24 1900  rifaximin  (XIFAXAN ) tablet 550 mg        550 mg Oral 2 times daily 03/30/24 1800        Subjective: Patient was seen and examined  at bedside. Overnight events noted. Patient appears much improved , back to her baseline.  She is following commands.   Ammonia level slightly up to 49. Patient has developed significant  vomiting.   Objective: Vitals:   04/03/24 0435 04/03/24 1518 04/03/24 2028 04/04/24 0400  BP: 103/78 122/64 109/73 (!) 101/55  Pulse: 67 (!) 55 (!) 54 69  Resp: 19 16 19 20   Temp: 98.4 F (36.9 C) 97.8 F (36.6 C) (!) 97.3 F (36.3 C) 98.8 F (37.1 C)  TempSrc: Oral Oral Oral Oral  SpO2: 97% 96% 99% 94%  Weight:      Height:        Intake/Output Summary (Last 24 hours) at 04/04/2024 1231 Last data filed at 04/04/2024 0550 Gross per 24 hour  Intake 480 ml  Output 1150 ml  Net -670 ml   Filed Weights   03/30/24 1444 03/30/24 2242 04/01/24 1349  Weight: 65.8 kg 65.9 kg 63.5 kg    Examination:  General exam: Appears calm and comfortable, severely deconditioned, not in any distress. Respiratory system: CTA Bilaterally. Respiratory effort normal.  RR 14 Cardiovascular system: S1 & S2 heard, RRR. No JVD, murmurs, rubs, gallops or clicks.  Gastrointestinal system: Abdomen is non distended, soft and non tender. Normal bowel sounds heard. Central nervous system: Alert and oriented x 2. No focal neurological deficits. Extremities: No edema, no cyanosis, no clubbing. Skin: No rashes, lesions or ulcers Psychiatry: Mood & affect appropriate.     Data Reviewed: I have personally reviewed following labs and imaging studies  CBC: Recent Labs  Lab 03/30/24 1515 03/31/24 0021 04/01/24 0301 04/02/24 0420 04/04/24 0353  WBC 4.4 3.0* 4.0 4.3 3.7*  NEUTROABS 2.9  --   --   --   --   HGB 9.9* 8.5* 8.4* 8.2* 7.9*  HCT 30.4* 27.0* 26.3* 26.4* 25.5*  MCV 103.4* 108.0* 106.5* 108.6* 109.0*  PLT 135* 107* 112* 106* 123*   Basic Metabolic Panel: Recent Labs  Lab 03/31/24 0021 03/31/24 0320 04/01/24 0301 04/02/24 0420 04/04/24 0353  NA 139 136 143 138 136  K 4.9 5.1 4.6 4.2 3.6  CL 107 105 115* 112* 107  CO2 22 23 19* 20* 21*  GLUCOSE 68* 78 79 76 73  BUN 27* 27* 28* 20 21  CREATININE 1.15* 1.08* 1.14* 0.88 1.08*  CALCIUM  9.1 8.8* 9.0 8.8* 8.6*  MG 1.4*  --  1.7  --  1.5*   PHOS 2.5  --  4.4  --  3.5   GFR: Estimated Creatinine Clearance: 40.7 mL/min (A) (by C-G formula based on SCr of 1.08 mg/dL (H)). Liver Function Tests: Recent Labs  Lab 03/30/24 1515 03/30/24 1850 03/31/24 0320 04/01/24 0301 04/02/24 0420  AST 105* 95* 83* 76* 74*  ALT 64* 57* 53* 49* 45*  ALKPHOS 154* 139* 126 112 113  BILITOT 1.1 1.2 1.5* 1.2 1.3*  PROT 7.2 6.5 6.0* 5.6* 5.9*  ALBUMIN  2.9* 2.6* 2.4* 2.3* 2.3*   No results for input(s): "LIPASE", "AMYLASE" in the last 168 hours. Recent Labs  Lab 03/31/24 0021 04/01/24 0301 04/02/24 0420 04/03/24 0353 04/04/24 0353  AMMONIA 53* 43* 35 42* 49*   Coagulation Profile: Recent Labs  Lab 03/30/24 1515 04/01/24 0301  INR 1.4* 1.5*   Cardiac Enzymes: No results for input(s): "CKTOTAL", "CKMB", "CKMBINDEX", "TROPONINI" in the last 168 hours. BNP (last 3 results) Recent Labs    08/10/23 0946  PROBNP 45.0   HbA1C: No results for input(s): "HGBA1C" in the last 72  hours. CBG: Recent Labs  Lab 04/03/24 2357 04/04/24 0358 04/04/24 0515 04/04/24 0738 04/04/24 1125  GLUCAP 112* 68* 100* 74 87   Lipid Profile: No results for input(s): "CHOL", "HDL", "LDLCALC", "TRIG", "CHOLHDL", "LDLDIRECT" in the last 72 hours. Thyroid  Function Tests: No results for input(s): "TSH", "T4TOTAL", "FREET4", "T3FREE", "THYROIDAB" in the last 72 hours.  Anemia Panel: No results for input(s): "VITAMINB12", "FOLATE", "FERRITIN", "TIBC", "IRON", "RETICCTPCT" in the last 72 hours. Sepsis Labs: Recent Labs  Lab 03/30/24 1947  LATICACIDVEN 0.9    Recent Results (from the past 240 hours)  Blood culture (routine x 2)     Status: Abnormal   Collection Time: 03/30/24  7:07 PM   Specimen: BLOOD RIGHT ARM  Result Value Ref Range Status   Specimen Description   Final    BLOOD RIGHT ARM Performed at [facility], [address] [place], [place] [address]    Special Requests   Final    BOTTLES DRAWN AEROBIC AND ANAEROBIC Blood  Culture adequate volume Performed at [facility], [address] [place], [place] [address]    Culture  Setup Time   Final    GRAM POSITIVE COCCI ANAEROBIC BOTTLE ONLY CRITICAL RESULT CALLED TO, READ BACK BY AND VERIFIED WITH: L POINDEXTER,PHARMD@0030  04/03/24 MK    Culture (A)  Final    STAPHYLOCOCCUS EPIDERMIDIS THE SIGNIFICANCE OF ISOLATING THIS ORGANISM FROM A SINGLE SET OF BLOOD CULTURES WHEN MULTIPLE SETS ARE DRAWN IS UNCERTAIN. PLEASE NOTIFY THE MICROBIOLOGY DEPARTMENT WITHIN ONE WEEK IF SPECIATION AND SENSITIVITIES ARE REQUIRED. Performed at [facility], [address], [place], [place] [address]    Report Status 04/04/2024 FINAL  Final  Blood Culture ID Panel (Reflexed)     Status: Abnormal   Collection Time: 03/30/24  7:07 PM  Result Value Ref Range Status   Enterococcus faecalis NOT DETECTED NOT DETECTED Final   Enterococcus Faecium NOT DETECTED NOT DETECTED Final   Listeria monocytogenes NOT DETECTED NOT DETECTED Final   Staphylococcus species DETECTED (A) NOT DETECTED Final    Comment: CRITICAL RESULT CALLED TO, READ BACK BY AND VERIFIED WITH: L POINDEXTER,PHARMD@0030  04/03/24 MK    Staphylococcus aureus (BCID) NOT DETECTED NOT DETECTED Final   Staphylococcus epidermidis DETECTED (A) NOT DETECTED Final    Comment: Methicillin (oxacillin) resistant coagulase negative staphylococcus. Possible blood culture contaminant (unless isolated from more than one blood culture draw or clinical case suggests pathogenicity). No antibiotic treatment is indicated for blood  culture contaminants. CRITICAL RESULT CALLED TO, READ BACK BY AND VERIFIED WITH: L POINDEXTER,PHARMD@0030  04/03/24 MK    Staphylococcus lugdunensis NOT DETECTED NOT DETECTED Final   Streptococcus species NOT DETECTED NOT DETECTED Final   Streptococcus agalactiae NOT DETECTED NOT DETECTED Final   Streptococcus pneumoniae NOT DETECTED NOT DETECTED Final   Streptococcus pyogenes NOT  DETECTED NOT DETECTED Final   A.calcoaceticus-baumannii NOT DETECTED NOT DETECTED Final   Bacteroides fragilis NOT DETECTED NOT DETECTED Final   Enterobacterales NOT DETECTED NOT DETECTED Final   Enterobacter cloacae complex NOT DETECTED NOT DETECTED Final   Escherichia coli NOT DETECTED NOT DETECTED Final   Klebsiella aerogenes NOT DETECTED NOT DETECTED Final   Klebsiella oxytoca NOT DETECTED NOT DETECTED Final   Klebsiella pneumoniae NOT DETECTED NOT DETECTED Final   Proteus species NOT DETECTED NOT DETECTED Final   Salmonella species NOT DETECTED NOT DETECTED Final   Serratia marcescens NOT DETECTED NOT DETECTED Final   Haemophilus influenzae NOT DETECTED NOT DETECTED Final   Neisseria  meningitidis NOT DETECTED NOT DETECTED Final   Pseudomonas aeruginosa NOT DETECTED NOT DETECTED Final   Stenotrophomonas maltophilia NOT DETECTED NOT DETECTED Final   Candida albicans NOT DETECTED NOT DETECTED Final   Candida auris NOT DETECTED NOT DETECTED Final   Candida glabrata NOT DETECTED NOT DETECTED Final   Candida krusei NOT DETECTED NOT DETECTED Final   Candida parapsilosis NOT DETECTED NOT DETECTED Final   Candida tropicalis NOT DETECTED NOT DETECTED Final   Cryptococcus neoformans/gattii NOT DETECTED NOT DETECTED Final   Methicillin resistance mecA/C DETECTED (A) NOT DETECTED Final    Comment: CRITICAL RESULT CALLED TO, READ BACK BY AND VERIFIED WITH: L POINDEXTER,PHARMD@0030  04/03/24 MK Performed at [facility], [address], [place], [place] [address]   Blood culture (routine x 2)     Status: None   Collection Time: 03/30/24  7:38 PM   Specimen: BLOOD LEFT HAND  Result Value Ref Range Status   Specimen Description   Final    BLOOD LEFT HAND Performed at [facility], [address] [place], [place] [address]    Special Requests   Final    BOTTLES DRAWN AEROBIC AND ANAEROBIC Blood Culture adequate volume Performed at [facility], [address], [place], [place] [address]    Culture   Final    NO GROWTH 5 DAYS Performed at [facility], [address], [place], [place] [address]    Report Status 04/04/2024 FINAL  Final  MRSA Next Gen by PCR, Nasal     Status: None   Collection Time: 03/30/24 10:11 PM   Specimen: Nasal Mucosa; Nasal Swab  Result Value Ref Range Status   MRSA by PCR Next Gen NOT DETECTED NOT DETECTED Final    Comment: (NOTE) The GeneXpert MRSA Assay (FDA approved for NASAL specimens only), is one component of a comprehensive MRSA colonization surveillance program. It is not intended to diagnose MRSA infection nor to guide or monitor treatment for MRSA infections. Test performance is not FDA approved in patients less than 39 years old. Performed at [facility], [address], [place], [place] [address]    Radiology Studies: DG Abd Portable 1V Result Date: 04/03/2024 CLINICAL DATA:  Constipation, nausea and vomiting. EXAM: PORTABLE ABDOMEN - 1 VIEW COMPARISON:  None Available. FINDINGS: Gas is seen in nondistended small bowel and colon. Transjugular intrahepatic portosystemic shunt in the right upper quadrant. Probable linear scarring in the costophrenic angle. IMPRESSION: No acute findings. Electronically Signed   By: Shearon Denis M.D.   On: 04/03/2024 19:38     Scheduled Meds:  furosemide   20 mg Oral Daily   lactulose   30 g Oral TID   lactulose   300 mL Rectal TID   rifaximin   550 mg Oral BID   spironolactone   50 mg Oral Daily   Continuous Infusions:     LOS: 5 days    Time spent: 35 mins    Magdalene School, MD [facility]   If 7PM-7AM, please contact night-coverage

## 2024-04-05 DIAGNOSIS — I8500 Esophageal varices without bleeding: Secondary | ICD-10-CM

## 2024-04-05 DIAGNOSIS — K7460 Unspecified cirrhosis of liver: Secondary | ICD-10-CM

## 2024-04-05 DIAGNOSIS — E875 Hyperkalemia: Secondary | ICD-10-CM | POA: Insufficient documentation

## 2024-04-05 DIAGNOSIS — E162 Hypoglycemia, unspecified: Secondary | ICD-10-CM | POA: Insufficient documentation

## 2024-04-05 DIAGNOSIS — K766 Portal hypertension: Secondary | ICD-10-CM

## 2024-04-05 DIAGNOSIS — Z85038 Personal history of other malignant neoplasm of large intestine: Secondary | ICD-10-CM

## 2024-04-05 DIAGNOSIS — D539 Nutritional anemia, unspecified: Secondary | ICD-10-CM | POA: Insufficient documentation

## 2024-04-05 DIAGNOSIS — K7682 Hepatic encephalopathy: Secondary | ICD-10-CM | POA: Diagnosis not present

## 2024-04-05 LAB — COMPREHENSIVE METABOLIC PANEL WITH GFR
ALT: 39 U/L (ref 0–44)
AST: 61 U/L — ABNORMAL HIGH (ref 15–41)
Albumin: 2.2 g/dL — ABNORMAL LOW (ref 3.5–5.0)
Alkaline Phosphatase: 133 U/L — ABNORMAL HIGH (ref 38–126)
Anion gap: 8 (ref 5–15)
BUN: 18 mg/dL (ref 8–23)
CO2: 21 mmol/L — ABNORMAL LOW (ref 22–32)
Calcium: 8.4 mg/dL — ABNORMAL LOW (ref 8.9–10.3)
Chloride: 105 mmol/L (ref 98–111)
Creatinine, Ser: 1.05 mg/dL — ABNORMAL HIGH (ref 0.44–1.00)
GFR, Estimated: 56 mL/min — ABNORMAL LOW (ref >60)
Glucose, Bld: 61 mg/dL — ABNORMAL LOW (ref 70–99)
Potassium: 3.6 mmol/L (ref 3.5–5.1)
Sodium: 134 mmol/L — ABNORMAL LOW (ref 135–145)
Total Bilirubin: 1 mg/dL (ref 0.0–1.2)
Total Protein: 5.6 g/dL — ABNORMAL LOW (ref 6.5–8.1)

## 2024-04-05 LAB — GLUCOSE, CAPILLARY
Glucose-Capillary: 58 mg/dL — ABNORMAL LOW (ref 70–99)
Glucose-Capillary: 101 mg/dL — ABNORMAL HIGH (ref 70–99)
Glucose-Capillary: 66 mg/dL — ABNORMAL LOW (ref 70–99)
Glucose-Capillary: 72 mg/dL (ref 70–99)
Glucose-Capillary: 94 mg/dL (ref 70–99)

## 2024-04-05 LAB — CBC
HCT: 24.8 % — ABNORMAL LOW (ref 36.0–46.0)
Hemoglobin: 7.8 g/dL — ABNORMAL LOW (ref 12.0–15.0)
MCH: 34.5 pg — ABNORMAL HIGH (ref 26.0–34.0)
MCHC: 31.5 g/dL (ref 30.0–36.0)
MCV: 109.7 fL — ABNORMAL HIGH (ref 80.0–100.0)
Platelets: 118 10*3/uL — ABNORMAL LOW (ref 150–400)
RBC: 2.26 MIL/uL — ABNORMAL LOW (ref 3.87–5.11)
RDW: 17.8 % — ABNORMAL HIGH (ref 11.5–15.5)
WBC: 3.4 10*3/uL — ABNORMAL LOW (ref 4.0–10.5)
nRBC: 0 % (ref 0.0–0.2)

## 2024-04-05 LAB — PHOSPHORUS: Phosphorus: 3.1 mg/dL (ref 2.5–4.6)

## 2024-04-05 LAB — MAGNESIUM: Magnesium: 1.4 mg/dL — ABNORMAL LOW (ref 1.7–2.4)

## 2024-04-05 MED ORDER — MAGNESIUM OXIDE 400 MG PO TABS: 400.0000 mg | ORAL_TABLET | Freq: Every day | ORAL | 0 refills | Status: AC

## 2024-04-05 MED ORDER — BLOOD GLUCOSE MONITORING SUPPL DEVI: 1.0000 | Freq: Three times a day (TID) | 0 refills | Status: DC

## 2024-04-05 MED ORDER — LANCETS MISC. MISC: 1.0000 | Freq: Three times a day (TID) | 0 refills | Status: AC

## 2024-04-05 MED ORDER — LANCET DEVICE MISC: 1.0000 | Freq: Three times a day (TID) | 0 refills | Status: AC

## 2024-04-05 MED ORDER — BLOOD GLUCOSE TEST VI STRP: 1.0000 | ORAL_STRIP | Freq: Three times a day (TID) | 0 refills | Status: AC

## 2024-04-05 MED ORDER — MAGNESIUM SULFATE 2 GM/50ML IV SOLN
2.0000 g | Freq: Once | INTRAVENOUS | Status: AC
  Administered 2024-04-05: 2 g via INTRAVENOUS
  Filled 2024-04-05: qty 50

## 2024-04-05 NOTE — Discharge Summary (Signed)
 Physician Discharge Summary  [name] WUJ:[id number] DOB: August 16, 1951 DOA: 03/30/2024  PCP: Dorothe Gaster, NP  Admit date: 03/30/2024 Discharge date: 04/05/2024  Admitted From: Home Disposition:  Home  Recommendations for Outpatient Follow-up:  Follow up with PCP in 1 week Follow up with liver clinic in 1 month Repeat BMP and magnesium  in 1 week to ensure stability of electrolytes  Discharge Condition: Stable CODE STATUS: Full  Diet recommendation: Regular   Brief/Interim Summary: [name] is a 73 yrs old female with PMH significant for cecal cancer status post hemicolectomy, chemo which induced liver cirrhosis with portal hypertension, EVs s/p ligation, s/p TIPS 2012, prior hepatic encephalopathy, thrombocytopenia.  Patient was brought to the ED for worsening mental status.  Patient takes lactulose  3 times daily at home whenever her confusion worsen then husband increases the dose of lactulose  and also gives her rifaximin  for few days.  Patient continued to remain confused despite being given increased dose of lactulose  and rifaximin .  Patient has not had a bowel movement in last 4 days.  In the interval patient became very confused, disoriented and sleepy.  She was hypothermic in the ED with ammonia level 107.  Patient was admitted for further evaluation. Patient was started on Bair hugger for hypothermia.  She was also given dextrose , insulin  and Lokelma  for hyperkalemia.  Patient was admitted for further evaluation.    With lactulose  enema patient hepatic encephalopathy improved.  She was resumed back on oral lactulose  and Xifaxan .  Hyperkalemia was thought to be secondary to Aldactone  use.  This was treated and improved.  Aldactone  resumed. For hypomagnesemia, this was replaced and ordered for oral supplementation on discharge.  GI also consulted on patient during hospitalization.  She also had episodes of hypoglycemia discussed with husband on eating snack prior to bedtime and  check blood sugars at home over the next week and follow-up closely with PCP.  Discharge Diagnoses:   Principal Problem:   Acute hepatic encephalopathy (HCC) Active Problems:   Decompensated liver cirrhosis with portal HTN and gastric varices   Hypothyroidism   Hx of colon cancer, stage III   Portal hypertension (HCC)   Hypomagnesemia   Weakness   Thrombocytopenia (HCC)   History of stroke   Hyperkalemia   Hypoglycemia   Macrocytic anemia     Discharge Instructions  Discharge Instructions     Ambulatory referral to Physical Therapy   Complete by: As directed    Call MD for:  difficulty breathing, headache or visual disturbances   Complete by: As directed    Call MD for:  extreme fatigue   Complete by: As directed    Call MD for:  persistant dizziness or light-headedness   Complete by: As directed    Call MD for:  persistant nausea and vomiting   Complete by: As directed    Call MD for:  severe uncontrolled pain   Complete by: As directed    Call MD for:  temperature >100.4   Complete by: As directed    Discharge instructions   Complete by: As directed    You were cared for by a hospitalist during your hospital stay. If you have any questions about your discharge medications or the care you received while you were in the hospital after you are discharged, you can call the unit and ask to speak with the hospitalist on call if the hospitalist that took care of you is not available. Once you are discharged, your primary care physician  will handle any further medical issues. Please note that NO REFILLS for any discharge medications will be authorized once you are discharged, as it is imperative that you return to your primary care physician (or establish a relationship with a primary care physician if you do not have one) for your aftercare needs so that they can reassess your need for medications and monitor your lab values.   Increase activity slowly   Complete by: As  directed       Allergies as of 04/05/2024       Reactions   Contrast Media [iodinated Contrast Media] Hives        Medication List     TAKE these medications    aspirin  EC 81 MG tablet Take 1 tablet (81 mg total) by mouth daily. Swallow whole.   Blood Glucose Monitoring Suppl Devi 1 each by Does not apply route in the morning, at noon, and at bedtime. May substitute to any manufacturer covered by patient's insurance.   BLOOD GLUCOSE TEST STRIPS Strp 1 each by In Vitro route in the morning, at noon, and at bedtime. May substitute to any manufacturer covered by patient's insurance.   CRANBERRY PO Take 1 tablet by mouth 2 (two) times daily.   furosemide  20 MG tablet Commonly known as: LASIX  Take 20-40 mg by mouth See admin instructions. Take blood pressure and depending on reading take 20mg  or 40mg  by mouth daily.   lactulose  10 GM/15ML solution Commonly known as: CHRONULAC  Take 45 mLs (30 g total) by mouth 3 (three) times daily.   Lancet Device Misc 1 each by Does not apply route in the morning, at noon, and at bedtime. May substitute to any manufacturer covered by patient's insurance.   Lancets Misc. Misc 1 each by Does not apply route in the morning, at noon, and at bedtime. May substitute to any manufacturer covered by patient's insurance.   levOCARNitine  330 MG tablet Commonly known as: CARNITOR  Take 330 mg by mouth 3 (three) times daily.   levothyroxine  25 MCG tablet Commonly known as: SYNTHROID  Take 25 mcg by mouth daily before breakfast.   liver oil-zinc  oxide 40 % ointment Commonly known as: DESITIN Apply topically daily as needed for irritation.   magnesium  oxide 400 MG tablet Commonly known as: MAG-OX Take 1 tablet (400 mg total) by mouth daily.   Multi-Vitamin tablet Take 1 tablet by mouth daily.   polyethylene glycol 17 g packet Commonly known as: MIRALAX  / GLYCOLAX  Take 17 g by mouth daily as needed for mild constipation.   rifaximin  550 MG  Tabs tablet Commonly known as: XIFAXAN  Take 1 tablet (550 mg total) by mouth 2 (two) times daily. What changed:  when to take this reasons to take this additional instructions   spironolactone  25 MG tablet Commonly known as: ALDACTONE  Take 50 mg by mouth daily.        Follow-up Information     Dorothe Gaster, NP. Schedule an appointment as soon as possible for a visit in 1 week(s).   Specialties: Nurse Practitioner, Family Medicine Why: Check fasting blood sugars each morning over the next week; keep track and follow up with PCP. Ask PCP to repeat BMP and Magnesium  blood work to ensure stability. Contact information: [address] [place] [address] [phone]         Duey Ghent, CRNP. Schedule an appointment as soon as possible for a visit in 1 month(s).   Specialty: Nurse Practitioner Contact information: [address]  [place] [address] [phone]                Allergies  Allergen Reactions   Contrast Media [Iodinated Contrast Media] Hives    Consultations: GI    Procedures/Studies: DG Abd Portable 1V Result Date: 04/03/2024 CLINICAL DATA:  Constipation, nausea and vomiting. EXAM: PORTABLE ABDOMEN - 1 VIEW COMPARISON:  None Available. FINDINGS: Gas is seen in nondistended small bowel and colon. Transjugular intrahepatic portosystemic shunt in the right upper quadrant. Probable linear scarring in the costophrenic angle. IMPRESSION: No acute findings. Electronically Signed   By: Shearon Denis M.D.   On: 04/03/2024 19:38   US  ABDOMEN LIMITED WITH LIVER DOPPLER Result Date: 03/31/2024 CLINICAL DATA:  History of cirrhosis status post TIPS EXAM: DUPLEX ULTRASOUND OF LIVER TECHNIQUE: Color and duplex Doppler ultrasound was performed to evaluate the hepatic in-flow and out-flow vessels. COMPARISON:  Hepatic Doppler ultrasound examination dated 12/22/2023 FINDINGS: Liver: Normal parenchymal echogenicity. Normal hepatic contour without  nodularity. No focal lesion, mass or intrahepatic biliary ductal dilatation. Incidentally noted cholelithiasis. Gallstone measures 8 mm. No gallbladder wall thickening. Common bile duct measures 4 mm. Portal Vein Velocities: Main: 70 cm/sec, previously 16 centimeters/second Right: 71 cm/sec, previously 53 centimeters/second Left: 41 cm/sec, previously in 12 centimeters/second Transjugular intrahepatic portosystemic shunt velocities: Prox:  74 cm/sec Mid: 78 cm/sec Dist:  96 cm/sec, previously 49 cm/second Hepatic Vein Velocities Right:  55 cm/sec, previously 20 cm/second Middle:  61 cm/sec, previously 19 centimeters/second Left: Not measured. Hepatic Artery Velocity:  128 cm/sec Splenic Vein Velocity:  17 cm/sec Ascites: None Varices: Previously documented. IMPRESSION: 1. Patent TIPS. Globally elevated venous velocities when compared to prior examination may be related to technique. 2. Incidentally noted cholelithiasis. Electronically Signed   By: Limin  Xu M.D.   On: 03/31/2024 19:09   DG CHEST PORT 1 VIEW Result Date: 03/31/2024 CLINICAL DATA:  Pneumonia. EXAM: PORTABLE CHEST 1 VIEW COMPARISON:  12/21/2023 FINDINGS: Lung volumes are low. The heart is upper normal in size but stable. No confluent airspace disease. Bronchovascular crowding related to low lung volumes. No pneumothorax or large pleural effusion. TIPS in the right upper quadrant of the abdomen. IMPRESSION: Low lung volumes with bronchovascular crowding. No confluent airspace disease. Electronically Signed   By: Chadwick Colonel M.D.   On: 03/31/2024 17:17       Discharge Exam: Vitals:   04/04/24 1936 04/05/24 0735  BP: 97/62 109/65  Pulse: 60 (!) 56  Resp: 15 20  Temp: 97.8 F (36.6 C) 97.7 F (36.5 C)  SpO2: 97% 97%    General: Pt is alert, awake, not in acute distress Cardiovascular: RRR, S1/S2 + Respiratory: CTA bilaterally, no wheezing, no rhonchi, no respiratory distress, no conversational dyspnea  Abdominal: Soft, NT, ND,  bowel sounds + Extremities: no edema, no cyanosis Psych: Normal mood and affect  The results of significant diagnostics from this hospitalization (including imaging, microbiology, ancillary and laboratory) are listed below for reference.     Microbiology: Recent Results (from the past 240 hours)  Blood culture (routine x 2)     Status: Abnormal   Collection Time: 03/30/24  7:07 PM   Specimen: BLOOD RIGHT ARM  Result Value Ref Range Status   Specimen Description   Final    BLOOD RIGHT ARM Performed at [facility], [address] [place], [place] [address]    Special Requests   Final    BOTTLES DRAWN AEROBIC AND ANAEROBIC Blood Culture adequate volume Performed at [facility], [address], [place], [place] [address]    Culture  Setup Time   Final    GRAM POSITIVE COCCI ANAEROBIC BOTTLE ONLY CRITICAL RESULT CALLED TO, READ BACK BY AND VERIFIED WITH: L POINDEXTER,PHARMD@0030  04/03/24 MK    Culture (A)  Final    STAPHYLOCOCCUS EPIDERMIDIS THE SIGNIFICANCE OF ISOLATING THIS ORGANISM FROM A SINGLE SET OF BLOOD CULTURES WHEN MULTIPLE SETS ARE DRAWN IS UNCERTAIN. PLEASE NOTIFY THE MICROBIOLOGY DEPARTMENT WITHIN ONE WEEK IF SPECIATION AND SENSITIVITIES ARE REQUIRED. Performed at [facility], [address] [place], [place] [address]    Report Status 04/04/2024 FINAL  Final  Blood Culture ID Panel (Reflexed)     Status: Abnormal   Collection Time: 03/30/24  7:07 PM  Result Value Ref Range Status   Enterococcus faecalis NOT DETECTED NOT DETECTED Final   Enterococcus Faecium NOT DETECTED NOT DETECTED Final   Listeria monocytogenes NOT DETECTED NOT DETECTED Final   Staphylococcus species DETECTED (A) NOT DETECTED Final    Comment: CRITICAL RESULT CALLED TO, READ BACK BY AND VERIFIED WITH: L POINDEXTER,PHARMD@0030  04/03/24 MK    Staphylococcus aureus (BCID) NOT DETECTED NOT DETECTED Final   Staphylococcus epidermidis DETECTED (A) NOT DETECTED Final     Comment: Methicillin (oxacillin) resistant coagulase negative staphylococcus. Possible blood culture contaminant (unless isolated from more than one blood culture draw or clinical case suggests pathogenicity). No antibiotic treatment is indicated for blood  culture contaminants. CRITICAL RESULT CALLED TO, READ BACK BY AND VERIFIED WITH: L POINDEXTER,PHARMD@0030  04/03/24 MK    Staphylococcus lugdunensis NOT DETECTED NOT DETECTED Final   Streptococcus species NOT DETECTED NOT DETECTED Final   Streptococcus agalactiae NOT DETECTED NOT DETECTED Final   Streptococcus pneumoniae NOT DETECTED NOT DETECTED Final   Streptococcus pyogenes NOT DETECTED NOT DETECTED Final   A.calcoaceticus-baumannii NOT DETECTED NOT DETECTED Final   Bacteroides fragilis NOT DETECTED NOT DETECTED Final   Enterobacterales NOT DETECTED NOT DETECTED Final   Enterobacter cloacae complex NOT DETECTED NOT DETECTED Final   Escherichia coli NOT DETECTED NOT DETECTED Final   Klebsiella aerogenes NOT DETECTED NOT DETECTED Final   Klebsiella oxytoca NOT DETECTED NOT DETECTED Final   Klebsiella pneumoniae NOT DETECTED NOT DETECTED Final   Proteus species NOT DETECTED NOT DETECTED Final   Salmonella species NOT DETECTED NOT DETECTED Final   Serratia marcescens NOT DETECTED NOT DETECTED Final   Haemophilus influenzae NOT DETECTED NOT DETECTED Final   Neisseria meningitidis NOT DETECTED NOT DETECTED Final   Pseudomonas aeruginosa NOT DETECTED NOT DETECTED Final   Stenotrophomonas maltophilia NOT DETECTED NOT DETECTED Final   Candida albicans NOT DETECTED NOT DETECTED Final   Candida auris NOT DETECTED NOT DETECTED Final   Candida glabrata NOT DETECTED NOT DETECTED Final   Candida krusei NOT DETECTED NOT DETECTED Final   Candida parapsilosis NOT DETECTED NOT DETECTED Final   Candida tropicalis NOT DETECTED NOT DETECTED Final   Cryptococcus neoformans/gattii NOT DETECTED NOT DETECTED Final   Methicillin resistance mecA/C  DETECTED (A) NOT DETECTED Final    Comment: CRITICAL RESULT CALLED TO, READ BACK BY AND VERIFIED WITH: L POINDEXTER,PHARMD@0030  04/03/24 MK Performed at [facility], [address], [place], [place] [address]   Blood culture (routine x 2)     Status: None   Collection Time: 03/30/24  7:38 PM   Specimen: BLOOD LEFT HAND  Result Value Ref Range Status   Specimen Description   Final    BLOOD LEFT HAND Performed at [facility],  [address] [place], [place] [address]    Special Requests   Final    BOTTLES DRAWN AEROBIC AND ANAEROBIC Blood Culture adequate volume Performed at [facility], [address], [place], [place] [address]    Culture   Final    NO GROWTH 5 DAYS Performed at [facility], [address] [place], [place] [address]    Report Status 04/04/2024 FINAL  Final  MRSA Next Gen by PCR, Nasal     Status: None   Collection Time: 03/30/24 10:11 PM   Specimen: Nasal Mucosa; Nasal Swab  Result Value Ref Range Status   MRSA by PCR Next Gen NOT DETECTED NOT DETECTED Final    Comment: (NOTE) The GeneXpert MRSA Assay (FDA approved for NASAL specimens only), is one component of a comprehensive MRSA colonization surveillance program. It is not intended to diagnose MRSA infection nor to guide or monitor treatment for MRSA infections. Test performance is not FDA approved in patients less than 69 years old. Performed at [facility], [address], [place], [place] [address]      Labs: BNP (last 3 results) No results for input(s): "BNP" in the last 8760 hours. Basic Metabolic Panel: Recent Labs  Lab 03/31/24 0021 03/31/24 0320 04/01/24 0301 04/02/24 0420 04/04/24 0353 04/05/24 0412  NA 139 136 143 138 136 134*  K 4.9 5.1 4.6 4.2 3.6 3.6  CL 107 105 115* 112* 107 105  CO2 22 23 19* 20* 21* 21*  GLUCOSE 68* 78 79 76 73 61*  BUN 27* 27* 28* 20 21 18   CREATININE 1.15* 1.08* 1.14* 0.88 1.08* 1.05*  CALCIUM   9.1 8.8* 9.0 8.8* 8.6* 8.4*  MG 1.4*  --  1.7  --  1.5* 1.4*  PHOS 2.5  --  4.4  --  3.5 3.1   Liver Function Tests: Recent Labs  Lab 03/30/24 1850 03/31/24 0320 04/01/24 0301 04/02/24 0420 04/05/24 0412  AST 95* 83* 76* 74* 61*  ALT 57* 53* 49* 45* 39  ALKPHOS 139* 126 112 113 133*  BILITOT 1.2 1.5* 1.2 1.3* 1.0  PROT 6.5 6.0* 5.6* 5.9* 5.6*  ALBUMIN  2.6* 2.4* 2.3* 2.3* 2.2*   No results for input(s): "LIPASE", "AMYLASE" in the last 168 hours. Recent Labs  Lab 03/31/24 0021 04/01/24 0301 04/02/24 0420 04/03/24 0353 04/04/24 0353  AMMONIA 53* 43* 35 42* 49*   CBC: Recent Labs  Lab 03/30/24 1515 03/31/24 0021 04/01/24 0301 04/02/24 0420 04/04/24 0353 04/05/24 0412  WBC 4.4 3.0* 4.0 4.3 3.7* 3.4*  NEUTROABS 2.9  --   --   --   --   --   HGB 9.9* 8.5* 8.4* 8.2* 7.9* 7.8*  HCT 30.4* 27.0* 26.3* 26.4* 25.5* 24.8*  MCV 103.4* 108.0* 106.5* 108.6* 109.0* 109.7*  PLT 135* 107* 112* 106* 123* 118*   Cardiac Enzymes: No results for input(s): "CKTOTAL", "CKMB", "CKMBINDEX", "TROPONINI" in the last 168 hours. BNP: Invalid input(s): "POCBNP" CBG: Recent Labs  Lab 04/04/24 2351 04/05/24 0406 04/05/24 0434 04/05/24 0725 04/05/24 0749  GLUCAP 93 58* 101* 66* 72   D-Dimer No results for input(s): "DDIMER" in the last 72 hours. Hgb A1c No results for input(s): "HGBA1C" in the last 72 hours. Lipid Profile No results for input(s): "CHOL", "HDL", "LDLCALC", "TRIG", "CHOLHDL", "LDLDIRECT" in the last 72 hours. Thyroid  function studies No results for input(s): "TSH", "T4TOTAL", "T3FREE", "THYROIDAB" in the last 72 hours.  Invalid input(s): "FREET3" Anemia work up No results for  input(s): "VITAMINB12", "FOLATE", "FERRITIN", "TIBC", "IRON", "RETICCTPCT" in the last 72 hours. Urinalysis    Component Value Date/Time   COLORURINE YELLOW 03/31/2024 1517   APPEARANCEUR CLEAR 03/31/2024 1517   LABSPEC 1.010 03/31/2024 1517   PHURINE 9.0 (H) 03/31/2024 1517   GLUCOSEU  NEGATIVE 03/31/2024 1517   GLUCOSEU NEGATIVE 08/04/2023 0916   HGBUR NEGATIVE 03/31/2024 1517   BILIRUBINUR NEGATIVE 03/31/2024 1517   BILIRUBINUR negative 12/06/2023 1117   BILIRUBINUR Negative 02/04/2023 0917   KETONESUR NEGATIVE 03/31/2024 1517   PROTEINUR NEGATIVE 03/31/2024 1517   UROBILINOGEN 0.2 12/06/2023 1117   UROBILINOGEN 0.2 08/04/2023 0916   NITRITE NEGATIVE 03/31/2024 1517   LEUKOCYTESUR NEGATIVE 03/31/2024 1517   Sepsis Labs Recent Labs  Lab 04/01/24 0301 04/02/24 0420 04/04/24 0353 04/05/24 0412  WBC 4.0 4.3 3.7* 3.4*   Microbiology Recent Results (from the past 240 hours)  Blood culture (routine x 2)     Status: Abnormal   Collection Time: 03/30/24  7:07 PM   Specimen: BLOOD RIGHT ARM  Result Value Ref Range Status   Specimen Description   Final    BLOOD RIGHT ARM Performed at [facility], [address], [place], [place] [address]    Special Requests   Final    BOTTLES DRAWN AEROBIC AND ANAEROBIC Blood Culture adequate volume Performed at [facility], [address] [place], [place] [address]    Culture  Setup Time   Final    GRAM POSITIVE COCCI ANAEROBIC BOTTLE ONLY CRITICAL RESULT CALLED TO, READ BACK BY AND VERIFIED WITH: L POINDEXTER,PHARMD@0030  04/03/24 MK    Culture (A)  Final    STAPHYLOCOCCUS EPIDERMIDIS THE SIGNIFICANCE OF ISOLATING THIS ORGANISM FROM A SINGLE SET OF BLOOD CULTURES WHEN MULTIPLE SETS ARE DRAWN IS UNCERTAIN. PLEASE NOTIFY THE MICROBIOLOGY DEPARTMENT WITHIN ONE WEEK IF SPECIATION AND SENSITIVITIES ARE REQUIRED. Performed at [facility], [address], [place], [place] [address]    Report Status 04/04/2024 FINAL  Final  Blood Culture ID Panel (Reflexed)     Status: Abnormal   Collection Time: 03/30/24  7:07 PM  Result Value Ref Range Status   Enterococcus faecalis NOT DETECTED NOT DETECTED Final   Enterococcus Faecium NOT DETECTED NOT DETECTED Final   Listeria monocytogenes NOT DETECTED  NOT DETECTED Final   Staphylococcus species DETECTED (A) NOT DETECTED Final    Comment: CRITICAL RESULT CALLED TO, READ BACK BY AND VERIFIED WITH: L POINDEXTER,PHARMD@0030  04/03/24 MK    Staphylococcus aureus (BCID) NOT DETECTED NOT DETECTED Final   Staphylococcus epidermidis DETECTED (A) NOT DETECTED Final    Comment: Methicillin (oxacillin) resistant coagulase negative staphylococcus. Possible blood culture contaminant (unless isolated from more than one blood culture draw or clinical case suggests pathogenicity). No antibiotic treatment is indicated for blood  culture contaminants. CRITICAL RESULT CALLED TO, READ BACK BY AND VERIFIED WITH: L POINDEXTER,PHARMD@0030  04/03/24 MK    Staphylococcus lugdunensis NOT DETECTED NOT DETECTED Final   Streptococcus species NOT DETECTED NOT DETECTED Final   Streptococcus agalactiae NOT DETECTED NOT DETECTED Final   Streptococcus pneumoniae NOT DETECTED NOT DETECTED Final   Streptococcus pyogenes NOT DETECTED NOT DETECTED Final   A.calcoaceticus-baumannii NOT DETECTED NOT DETECTED Final   Bacteroides fragilis NOT DETECTED NOT DETECTED Final   Enterobacterales NOT DETECTED NOT DETECTED Final   Enterobacter cloacae complex NOT DETECTED NOT DETECTED Final   Escherichia coli NOT DETECTED NOT DETECTED Final   Klebsiella aerogenes NOT DETECTED NOT DETECTED Final   Klebsiella oxytoca NOT DETECTED NOT  DETECTED Final   Klebsiella pneumoniae NOT DETECTED NOT DETECTED Final   Proteus species NOT DETECTED NOT DETECTED Final   Salmonella species NOT DETECTED NOT DETECTED Final   Serratia marcescens NOT DETECTED NOT DETECTED Final   Haemophilus influenzae NOT DETECTED NOT DETECTED Final   Neisseria meningitidis NOT DETECTED NOT DETECTED Final   Pseudomonas aeruginosa NOT DETECTED NOT DETECTED Final   Stenotrophomonas maltophilia NOT DETECTED NOT DETECTED Final   Candida albicans NOT DETECTED NOT DETECTED Final   Candida auris NOT DETECTED NOT DETECTED Final    Candida glabrata NOT DETECTED NOT DETECTED Final   Candida krusei NOT DETECTED NOT DETECTED Final   Candida parapsilosis NOT DETECTED NOT DETECTED Final   Candida tropicalis NOT DETECTED NOT DETECTED Final   Cryptococcus neoformans/gattii NOT DETECTED NOT DETECTED Final   Methicillin resistance mecA/C DETECTED (A) NOT DETECTED Final    Comment: CRITICAL RESULT CALLED TO, READ BACK BY AND VERIFIED WITH: L POINDEXTER,PHARMD@0030  04/03/24 MK Performed at [facility], [address] [place], [place] [address]   Blood culture (routine x 2)     Status: None   Collection Time: 03/30/24  7:38 PM   Specimen: BLOOD LEFT HAND  Result Value Ref Range Status   Specimen Description   Final    BLOOD LEFT HAND Performed at [facility], [address], [place], [place] [address]    Special Requests   Final    BOTTLES DRAWN AEROBIC AND ANAEROBIC Blood Culture adequate volume Performed at [facility], [address], [place], [place] [address]    Culture   Final    NO GROWTH 5 DAYS Performed at [facility], [address], [place], [place] [address]    Report Status 04/04/2024 FINAL  Final  MRSA Next Gen by PCR, Nasal     Status: None   Collection Time: 03/30/24 10:11 PM   Specimen: Nasal Mucosa; Nasal Swab  Result Value Ref Range Status   MRSA by PCR Next Gen NOT DETECTED NOT DETECTED Final    Comment: (NOTE) The GeneXpert MRSA Assay (FDA approved for NASAL specimens only), is one component of a comprehensive MRSA colonization surveillance program. It is not intended to diagnose MRSA infection nor to guide or monitor treatment for MRSA infections. Test performance is not FDA approved in patients less than 45 years old. Performed at [facility], [address] [place], [place] [address]      Patient was seen and examined on the day of discharge and was found to be in stable condition. Time coordinating discharge: 40 minutes  including assessment and coordination of care, as well as examination of the patient.   SIGNED:  Daren Eck, DO [facility] 04/05/2024, 10:56 AM

## 2024-04-05 NOTE — Progress Notes (Signed)
 Hypoglycemic Event  CBG:66  Treatment: 4oz juice/soda  Symptoms: None  Follow-up CBG: Time:0745 CBG Result:72  Possible Reasons for Event: Inadequate meal intake  Comments/MD notified:Choi,Jennifer DO notified.    Ariele Vidrio N Macdonald Rigor

## 2024-04-05 NOTE — Care Management Important Message (Signed)
 Important Message  Patient Details  Name: [name] MRN: [id number] Date of Birth: [birth year]   Important Message Given:        Peyton Brash 04/05/2024, 1:14 PM

## 2024-04-05 NOTE — Progress Notes (Signed)
 [facility] Liaison Note  04/05/2024  [name] 07/24/1951 [id number]  Location: RN Hospital Liaison screened the patient remotely at [facility].  Insurance: [employer]   [name] is a 73 y.o. female who is a Primary Care Patient of Cable, Hoy Mackintosh, NP [facility] at [facility]. The patient was screened for readmission hospitalization with noted high risk score for unplanned readmission risk with 2 IP in 6 months.  The patient was assessed for potential Care Management service needs for post hospital transition for care coordination. Review of patient's electronic medical record reveals patient was admitted Acute hepatic encephalopathy. TOC documentation suggest [facility] however pt prefers OPPT post hospital discharge. VBCI listed to provide transition of care post hospital prevention follow up.  No other VBCI needs to address at this time.   Plan: [facility] Liaison will continue to follow progress and disposition to asess for post hospital community care coordination/management needs.  Referral request for community care coordination: anticipate Transitions of Care Team follow up.   VBCI Care Management/Population Health does not replace or interfere with any arrangements made by the Inpatient Transition of Care team.   For questions contact:   Lilla Reichert, RN, BSN Hospital Liaison [place]   [facility], Population Health Office Hours MTWF  8:00 am-6:00 pm Direct Dial: [phone] [email or web address]

## 2024-04-05 NOTE — Progress Notes (Addendum)
 Progress Note   Subjective  Chief Complaint: Noncirrhotic portal hypertension and suspected Florentina Huntsman cirrhosis with esophageal varices and hepatic encephalopathy  Today, patient continues to be improved.  She was able to have a very large good bowel movement last night which made her husband feel better.  He tells me she had a couple drops in her blood sugar which he tells me now they are going to have to keep an eye on.  Otherwise they are all ready for her to go home.   Objective   Vital signs in last 24 hours: Temp:  [97.7 F (36.5 C)-98.7 F (37.1 C)] 97.7 F (36.5 C) (05/14 0735) Pulse Rate:  [56-61] 56 (05/14 0735) Resp:  [15-20] 20 (05/14 0735) BP: (97-109)/(62-67) 109/65 (05/14 0735) SpO2:  [97 %-99 %] 97 % (05/14 0735) Last BM Date : 04/04/24 General:  Chronically ill appearing white female in NAD Heart:  Regular rate and rhythm; no murmurs Lungs: Respirations even and unlabored, lungs CTA bilaterally Abdomen:  Soft, nontender and nondistended. Normal bowel sounds. Neurologic:  Alert and oriented,  grossly normal neurologically. Psych:  Cooperative. Normal mood and affect.  Intake/Output from previous day: 05/13 0701 - 05/14 0700 In: 598 [P.O.:598] Out: 100 [Urine:100] Intake/Output this shift: Total I/O In: -  Out: 700 [Urine:700]  Lab Results: Recent Labs    04/04/24 0353 04/05/24 0412  WBC 3.7* 3.4*  HGB 7.9* 7.8*  HCT 25.5* 24.8*  PLT 123* 118*   BMET Recent Labs    04/04/24 0353 04/05/24 0412  NA 136 134*  K 3.6 3.6  CL 107 105  CO2 21* 21*  GLUCOSE 73 61*  BUN 21 18  CREATININE 1.08* 1.05*  CALCIUM  8.6* 8.4*   LFT Recent Labs    04/05/24 0412  PROT 5.6*  ALBUMIN  2.2*  AST 61*  ALT 39  ALKPHOS 133*  BILITOT 1.0   PT/INR No results for input(s): "LABPROT", "INR" in the last 72 hours.  Studies/Results: DG Abd Portable 1V Result Date: 04/03/2024 CLINICAL DATA:  Constipation, nausea and vomiting. EXAM: PORTABLE ABDOMEN - 1 VIEW  COMPARISON:  None Available. FINDINGS: Gas is seen in nondistended small bowel and colon. Transjugular intrahepatic portosystemic shunt in the right upper quadrant. Probable linear scarring in the costophrenic angle. IMPRESSION: No acute findings. Electronically Signed   By: Shearon Denis M.D.   On: 04/03/2024 19:38     Assessment / Plan:   Assessment: 1.  Noncirrhotic portal hypertension and suspected NASH cirrhosis: With esophageal varices status post variceal hemorrhage in 2012 status post TIPS, thrombocytopenia, hepatic encephalopathy, now with hepatic encephalopathy in the hospital that lingered over a week with unclear etiology, now improved over the past 48 hours, currently on Lactulose  and Xifaxan , doing well 2.  Hyperkalemia: Resolved, thought possibly induced by Spironolactone  3.  Macrocytic anemia: Hemoglobin stable 4.  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  History of adenocarcinoma the cecum: Stage IIIb, T3 N1 with lymph node mass measuring 4 cm, status post hemicolectomy and adjuvant FOLFOX x 12 cycles from 3/15-11/12/12/2022  Plan: 1.  Continue current medications 2.  Patient has follow-up with Jennine Mohair at the liver clinic in a month, they will call if anything changes prior to that. 3.  Okay for patient discharge home today.  Thank you for your kind consultation.  We will sign off.   LOS: 6 days   Graciella Lavender  04/05/2024, 9:13 AM  Agree with Ms. Juliane Och evaluation and plan.  Patient was discharged today.  Kenney Peacemaker, MD, [facility] See Tilford Foley on call - gastroenterology for best contact person 04/05/2024 2:11 PM

## 2024-04-05 NOTE — Progress Notes (Signed)
 Hypoglycemic Event  CBG: 58  Treatment: 4 oz juice/soda  Symptoms: None  Follow-up CBG: Time:0434 CBG Result:101  Possible Reasons for Event: Inadequate meal intake  Comments/MD notified: Q4 CBG monitoring  per order    [facility]

## 2024-04-06 ENCOUNTER — Telehealth: Payer: Self-pay | Admitting: *Deleted

## 2024-04-06 NOTE — Transitions of Care (Post Inpatient/ED Visit) (Signed)
   04/06/2024  Name: [name] MRN: [id number] DOB: [birth year]  Today's TOC FU Call Status: Today's TOC FU Call Status:: Unsuccessful Call (1st Attempt) Unsuccessful Call (1st Attempt) Date: 04/06/24  Attempted to reach the patient regarding the most recent Inpatient/ED visit.  Follow Up Plan: Additional outreach attempts will be made to reach the patient to complete the Transitions of Care (Post Inpatient/ED visit) call.   Arna Better RN, BSN [place]  [facility] RN Care Manager [phone]

## 2024-04-07 ENCOUNTER — Telehealth: Payer: Self-pay

## 2024-04-07 NOTE — Patient Instructions (Signed)
 Visit Information  Thank you for taking time to visit with me today. Please don't hesitate to contact me if I can be of assistance to you before our next scheduled telephone appointment.  Patient verbalizes understanding of instructions and care plan provided today and agrees to view in MyChart. Active MyChart status and patient understanding of how to access instructions and care plan via MyChart confirmed with patient.     The patient has been provided with contact information for the care management team and has been advised to call with any health related questions or concerns.   Please call the care guide team at [phone] if you need to cancel or reschedule your appointment.   Please call the Suicide and Crisis Lifeline: 988 if you are experiencing a Mental Health or Behavioral Health Crisis or need someone to talk to.  Osmel Dykstra J. Rosena Bartle RN, MSN [facility], [facility] RN Care Manager Direct Dial: [phone]  Fax: [phone] Website: [email or web address]

## 2024-04-07 NOTE — Transitions of Care (Post Inpatient/ED Visit) (Signed)
 04/07/2024  Name: [name] MRN: [id number] DOB: 1951/06/15  Today's TOC FU Call Status: Today's TOC FU Call Status:: Successful TOC FU Call Completed TOC FU Call Complete Date: 04/07/24 Patient's Name and Date of Birth confirmed.  Transition Care Management Follow-up Telephone Call Date of Discharge: 04/05/24 Discharge Facility: [facility]) Type of Discharge: Inpatient Admission Primary Inpatient Discharge Diagnosis:: Acute Hepatic Encephalopathy How have you been since you were released from the hospital?: Better Any questions or concerns?: No  Items Reviewed: Did you receive and understand the discharge instructions provided?: No Medications obtained,verified, and reconciled?: Yes (Medications Reviewed) Any new allergies since your discharge?: No Dietary orders reviewed?: Yes Type of Diet Ordered:: Regular diet Do you have support at home?: Yes People in Home [RPT]: spouse Name of Support/Comfort Primary Source: Dennard Fisher  Medications Reviewed Today: Medications Reviewed Today     Reviewed by Shadell Brenn, RN (Case Manager) on 04/07/24 at 1350  Med List Status: <None>   Medication Order Taking? Sig Documenting Provider Last Dose Status Informant  aspirin  EC 81 MG tablet [id number] Yes Take 1 tablet (81 mg total) by mouth daily. Swallow whole. Wynetta Heckle, MD Taking Active Spouse/Significant Other, Pharmacy Records  Blood Glucose Monitoring Suppl DEVI [id number] Yes 1 each by Does not apply route in the morning, at noon, and at bedtime. May substitute to any manufacturer covered by patient's insurance. Daren Eck, DO Taking Active   CRANBERRY PO [id number] Yes Take 1 tablet by mouth 2 (two) times daily. [provider] Taking Active Spouse/Significant Other, Pharmacy Records  furosemide  (LASIX ) 20 MG tablet [id number] Yes Take 20-40 mg by mouth See admin instructions. Take blood pressure and depending on reading take 20mg  or 40mg  by mouth daily. [provider] Taking Active Spouse/Significant Other, Pharmacy Records  Glucose Blood (BLOOD GLUCOSE TEST STRIPS) STRP [id number] Yes 1 each by In Vitro route in the morning, at noon, and at bedtime. May substitute to any manufacturer covered by patient's insurance. Daren Eck, DO Taking Active   lactulose  (CHRONULAC ) 10 GM/15ML solution [id number] Yes Take 45 mLs (30 g total) by mouth 3 (three) times daily. Rosena Conradi, MD Taking Active Spouse/Significant Other, Pharmacy Records  Lancet Device [place] [id number] Yes 1 each by Does not apply route in the morning, at noon, and at bedtime. May substitute to any manufacturer covered by patient's insurance. Daren Eck, DO Taking Active   Lancets Misc. MISC [id number] Yes 1 each by Does not apply route in the morning, at noon, and at bedtime. May substitute to any manufacturer covered by patient's insurance. Daren Eck, DO Taking Active   levOCARNitine  (CARNITOR ) 330 MG tablet [id number] Yes Take 330 mg by mouth 3 (three) times daily. [provider] Taking Active Spouse/Significant Other, Pharmacy Records  levothyroxine  (SYNTHROID ) 25 MCG tablet [id number] Yes Take 25 mcg by mouth daily before breakfast. [provider] Taking Active Spouse/Significant Other, Pharmacy Records  liver oil-zinc  oxide (DESITIN) 40 % ointment [id number] Yes Apply topically daily as needed for irritation. Pokhrel, Laxman, MD Taking Active Spouse/Significant Other, Pharmacy Records  magnesium  oxide (MAG-OX) 400 MG tablet [id number] Yes Take 1 tablet (400 mg total) by mouth daily. Daren Eck, DO Taking Active   Multiple Vitamin (MULTI-VITAMIN) tablet [id number] Yes Take 1 tablet by mouth daily. [provider] Taking Active Spouse/Significant Other, Pharmacy Records  polyethylene glycol (MIRALAX  / GLYCOLAX ) 17 g packet [id number] Yes Take 17 g by mouth daily as needed for mild constipation. Rosena Conradi, MD Taking Active Spouse/Significant  Other,  Pharmacy Records  rifaximin  (XIFAXAN ) 550 MG TABS tablet [id number] Yes Take 1 tablet (550 mg total) by mouth 2 (two) times daily.  Patient taking differently: Take 550 mg by mouth as needed. [place]   Wynetta Heckle, MD Taking Active Spouse/Significant Other, Pharmacy Records           Med Note Collene Dawson, JACQUELINE L   Mon Jan 31, 2024  8:36 AM)    spironolactone  (ALDACTONE ) 25 MG tablet [id number] Yes Take 50 mg by mouth daily. [provider] Taking Active Spouse/Significant Other, Pharmacy Records            Home Care and Equipment/Supplies: Were Home Health Services Ordered?: No (Outpatient PT) Any new equipment or medical supplies ordered?: No  Functional Questionnaire: Do you need assistance with bathing/showering or dressing?: Yes Do you need assistance with meal preparation?: Yes (Husband prepares meals) Do you need assistance with eating?: No Do you have difficulty maintaining continence: No Do you need assistance with getting out of bed/getting out of a chair/moving?: No Do you have difficulty managing or taking your medications?: No (spouse does medications)  Follow up appointments reviewed: PCP Follow-up appointment confirmed?: Yes Date of PCP follow-up appointment?: 04/10/24 Follow-up Provider: Marrianne Six Follow-up appointment confirmed?: No Reason Specialist Follow-Up Not Confirmed: Patient has Specialist Provider Number and will Call for Appointment Do you need transportation to your follow-up appointment?: No Do you understand care options if your condition(s) worsen?: Yes-patient verbalized understanding  SDOH Interventions Today    Flowsheet Row Most Recent Value  SDOH Interventions   Food Insecurity Interventions Intervention Not Indicated  Housing Interventions Intervention Not Indicated  Transportation Interventions Intervention Not Indicated  Utilities Interventions Intervention Not Indicated       Emma Schupp J. Maureen Delatte RN,  MSN [facility]  [facility], [facility] RN Care Manager Direct Dial: [phone]  Fax: [phone] Website: [email or web address]

## 2024-04-10 ENCOUNTER — Ambulatory Visit: Admitting: Nurse Practitioner

## 2024-04-10 VITALS — BP 108/76 | HR 55 | Temp 97.2°F | Ht 64.0 in | Wt 151.0 lb

## 2024-04-10 DIAGNOSIS — Z09 Encounter for follow-up examination after completed treatment for conditions other than malignant neoplasm: Secondary | ICD-10-CM

## 2024-04-10 DIAGNOSIS — R7989 Other specified abnormal findings of blood chemistry: Secondary | ICD-10-CM | POA: Diagnosis not present

## 2024-04-10 LAB — BASIC METABOLIC PANEL WITH GFR
BUN: 19 mg/dL (ref 6–23)
CO2: 33 meq/L — ABNORMAL HIGH (ref 19–32)
Calcium: 9.2 mg/dL (ref 8.4–10.5)
Chloride: 97 meq/L (ref 96–112)
Creatinine, Ser: 1.27 mg/dL — ABNORMAL HIGH (ref 0.40–1.20)
GFR: 42.14 mL/min — ABNORMAL LOW (ref >60.00)
Glucose, Bld: 99 mg/dL (ref 70–99)
Potassium: 4.8 meq/L (ref 3.5–5.1)
Sodium: 134 meq/L — ABNORMAL LOW (ref 135–145)

## 2024-04-10 LAB — CBC
HCT: 27.5 % — ABNORMAL LOW (ref 36.0–46.0)
Hemoglobin: 9.3 g/dL — ABNORMAL LOW (ref 12.0–15.0)
MCHC: 33.9 g/dL (ref 30.0–36.0)
MCV: 100.8 fl — ABNORMAL HIGH (ref 78.0–100.0)
Platelets: 280 10*3/uL (ref 150.0–400.0)
RBC: 2.73 Mil/uL — ABNORMAL LOW (ref 3.87–5.11)
RDW: 17.9 % — ABNORMAL HIGH (ref 11.5–15.5)
WBC: 3.7 10*3/uL — ABNORMAL LOW (ref 4.0–10.5)

## 2024-04-10 LAB — MAGNESIUM: Magnesium: 1.9 mg/dL (ref 1.5–2.5)

## 2024-04-10 NOTE — Assessment & Plan Note (Signed)
 History of same pending CBC today

## 2024-04-10 NOTE — Assessment & Plan Note (Signed)
 History of same.  Pending magnesium  level to make sure she is competent

## 2024-04-10 NOTE — Patient Instructions (Addendum)
 Nice to see you today I will be in touch with the labs once I have reviewed them Follow up with me in 1 month Try doing a protein rich snack prior to bed Check glucose in the mornings at least.

## 2024-04-10 NOTE — Assessment & Plan Note (Addendum)
 Did review emergency department note along with inpatient discharge note did review labs and imaging Patient is still experiencing some mild low glucose readings in the morning.  Encourage patient to have a food protein rich snack prior to bed in the event of having hypoglycemia she can have something with larger amounts of sugar like a juice or lifesaver but the need to follow-up with a protein rich snack to stabilize blood glucose thereafter.

## 2024-04-10 NOTE — Progress Notes (Signed)
 Acute Office Visit  Subjective:     Patient ID: [name], female    DOB: Apr 14, 1951, 73 y.o.   MRN: [id number]  Chief Complaint  Patient presents with   Hospitalization Follow-up    Pt complains of feeling better since hospital visit. Pt started checking Sugar levels after visit. Numbers have been in the 60s in the mornings. Tried snacking at night to help raise numbers but states it did not help. Bowel movements have come back on Saturday.     HPI Patient is in today for hospital follow-up  Patient was seen in the hospital on 03/30/2024 for altered mental status.  Patient been having confusion they increased her lactulose  on Sunday along with starting Xifaxan  the same day.  Patient not had a significant BM for the last 4 days.  Patient was more confused and more lethargic.  Patient was found to be hyper thermic, hypotensive.  Patient was also found to be hyperemic with potassium of 6.0 ammonia level of 107 with some electrolyte derangement.  Patient was admitted to the hospital for further evaluation. He was given Lokelma , insulin  and dextrose  for her hyperkalemia EKG did not show peaked T waves.  Patient was given a lactulose  enema.  Patient was discharged on 04/05/2024.  She was experiencing some hypoglycemia in the hospital I recommend her taking a bedtime snack and checking glucose at home frequently.  She is to follow-up with me along with liver clinic [name].  States that they are checking the glucose in the AM. States that they were checking it fasting in the am and was getting low readings. They got 59 the first time in the morning and dranks some apple juice and it went to 118. Checked at 3 pm and was 90 and 94 at 11pm. States that they bought some dates for a snack but not has started them but the past couple days she is having dinner late.   Statse that her bowels have normalized. States right out of the hosptial no BM but did have one that Saturday morning after  discharge Review of Systems  Constitutional:  Negative for chills and fever.  Respiratory:  Negative for shortness of breath.   Cardiovascular:  Negative for chest pain.  Gastrointestinal:  Negative for abdominal pain, constipation and diarrhea.  Neurological:  Negative for headaches.        Objective:    BP 108/76   Pulse (!) 55   Temp (!) 97.2 F (36.2 C) (Oral)   Ht 5\' 4"  (1.626 m)   Wt 151 lb (68.5 kg)   SpO2 99%   BMI 25.92 kg/m  BP Readings from Last 3 Encounters:  04/10/24 108/76  04/05/24 109/65  03/27/24 105/68   Wt Readings from Last 3 Encounters:  04/10/24 151 lb (68.5 kg)  04/07/24 139 lb (63 kg)  04/01/24 139 lb 15.9 oz (63.5 kg)   SpO2 Readings from Last 3 Encounters:  04/10/24 99%  04/05/24 97%  03/06/24 96%    Physical Exam Vitals and nursing note reviewed.  Constitutional:      Appearance: Normal appearance.  Cardiovascular:     Rate and Rhythm: Normal rate and regular rhythm.     Heart sounds: Normal heart sounds.  Pulmonary:     Effort: Pulmonary effort is normal.     Breath sounds: Normal breath sounds.  Neurological:     General: No focal deficit present.     Mental Status: She is alert and oriented to person,  place, and time.     No results found for any visits on 04/10/24.      Assessment & Plan:   Problem List Items Addressed This Visit       Other   Hypomagnesemia   History of same.  Pending magnesium  level to make sure she is competent      Relevant Orders   Magnesium    Hospital discharge follow-up - Primary   Did review emergency department note along with inpatient discharge note did review labs and imaging Patient is still experiencing some mild low glucose readings in the morning.  Encourage patient to have a food protein rich snack prior to bed in the event of having hypoglycemia she can have something with larger amounts of sugar like a juice or lifesaver but the need to follow-up with a protein rich snack to  stabilize blood glucose thereafter.      Relevant Orders   Basic metabolic panel with GFR   Magnesium    CBC   Abnormal CBC   History of same pending CBC today.      Relevant Orders   CBC    No orders of the defined types were placed in this encounter.   Return in about 4 weeks (around 05/08/2024).  Katherine Shay, NP

## 2024-04-11 ENCOUNTER — Ambulatory Visit: Payer: Self-pay | Admitting: Nurse Practitioner

## 2024-04-15 ENCOUNTER — Emergency Department (HOSPITAL_COMMUNITY)

## 2024-04-15 ENCOUNTER — Encounter (HOSPITAL_COMMUNITY): Payer: Self-pay

## 2024-04-15 ENCOUNTER — Observation Stay (HOSPITAL_COMMUNITY)
Admission: EM | Admit: 2024-04-15 | Discharge: 2024-04-16 | Disposition: A | Discharge status: Home / Self Care | Attending: Internal Medicine | Admitting: Internal Medicine

## 2024-04-15 ENCOUNTER — Other Ambulatory Visit: Payer: Self-pay

## 2024-04-15 DIAGNOSIS — D696 Thrombocytopenia, unspecified: Secondary | ICD-10-CM | POA: Diagnosis not present

## 2024-04-15 DIAGNOSIS — Z9049 Acquired absence of other specified parts of digestive tract: Secondary | ICD-10-CM | POA: Insufficient documentation

## 2024-04-15 DIAGNOSIS — I639 Cerebral infarction, unspecified: Principal | ICD-10-CM | POA: Diagnosis present

## 2024-04-15 DIAGNOSIS — E039 Hypothyroidism, unspecified: Secondary | ICD-10-CM | POA: Diagnosis not present

## 2024-04-15 DIAGNOSIS — I6389 Other cerebral infarction: Secondary | ICD-10-CM

## 2024-04-15 DIAGNOSIS — D649 Anemia, unspecified: Secondary | ICD-10-CM | POA: Diagnosis not present

## 2024-04-15 DIAGNOSIS — R9082 White matter disease, unspecified: Secondary | ICD-10-CM | POA: Diagnosis not present

## 2024-04-15 DIAGNOSIS — K7460 Unspecified cirrhosis of liver: Secondary | ICD-10-CM | POA: Diagnosis not present

## 2024-04-15 DIAGNOSIS — D72819 Decreased white blood cell count, unspecified: Secondary | ICD-10-CM | POA: Insufficient documentation

## 2024-04-15 DIAGNOSIS — I495 Sick sinus syndrome: Secondary | ICD-10-CM | POA: Diagnosis present

## 2024-04-15 DIAGNOSIS — Z8673 Personal history of transient ischemic attack (TIA), and cerebral infarction without residual deficits: Secondary | ICD-10-CM

## 2024-04-15 DIAGNOSIS — R29818 Other symptoms and signs involving the nervous system: Secondary | ICD-10-CM | POA: Diagnosis not present

## 2024-04-15 DIAGNOSIS — Q2112 Patent foramen ovale: Secondary | ICD-10-CM

## 2024-04-15 DIAGNOSIS — E2740 Unspecified adrenocortical insufficiency: Secondary | ICD-10-CM | POA: Diagnosis present

## 2024-04-15 DIAGNOSIS — R42 Dizziness and giddiness: Secondary | ICD-10-CM | POA: Diagnosis present

## 2024-04-15 DIAGNOSIS — Z8679 Personal history of other diseases of the circulatory system: Secondary | ICD-10-CM | POA: Diagnosis present

## 2024-04-15 DIAGNOSIS — Z79899 Other long term (current) drug therapy: Secondary | ICD-10-CM | POA: Diagnosis not present

## 2024-04-15 DIAGNOSIS — Z7982 Long term (current) use of aspirin: Secondary | ICD-10-CM | POA: Insufficient documentation

## 2024-04-15 DIAGNOSIS — R519 Headache, unspecified: Secondary | ICD-10-CM | POA: Diagnosis not present

## 2024-04-15 DIAGNOSIS — R29703 NIHSS score 3: Secondary | ICD-10-CM

## 2024-04-15 DIAGNOSIS — Z85828 Personal history of other malignant neoplasm of skin: Secondary | ICD-10-CM | POA: Diagnosis not present

## 2024-04-15 DIAGNOSIS — Z85038 Personal history of other malignant neoplasm of large intestine: Secondary | ICD-10-CM | POA: Diagnosis not present

## 2024-04-15 DIAGNOSIS — K766 Portal hypertension: Secondary | ICD-10-CM | POA: Diagnosis present

## 2024-04-15 DIAGNOSIS — I10 Essential (primary) hypertension: Secondary | ICD-10-CM

## 2024-04-15 DIAGNOSIS — D638 Anemia in other chronic diseases classified elsewhere: Secondary | ICD-10-CM | POA: Diagnosis present

## 2024-04-15 DIAGNOSIS — R404 Transient alteration of awareness: Secondary | ICD-10-CM | POA: Diagnosis not present

## 2024-04-15 LAB — COMPREHENSIVE METABOLIC PANEL WITH GFR
ALT: 37 U/L (ref 0–44)
AST: 92 U/L — ABNORMAL HIGH (ref 15–41)
Albumin: 2.4 g/dL — ABNORMAL LOW (ref 3.5–5.0)
Alkaline Phosphatase: 147 U/L — ABNORMAL HIGH (ref 38–126)
Anion gap: 8 (ref 5–15)
BUN: 16 mg/dL (ref 8–23)
CO2: 24 mmol/L (ref 22–32)
Calcium: 9 mg/dL (ref 8.9–10.3)
Chloride: 102 mmol/L (ref 98–111)
Creatinine, Ser: 0.99 mg/dL (ref 0.44–1.00)
GFR, Estimated: >60 mL/min (ref >60)
Glucose, Bld: 115 mg/dL — ABNORMAL HIGH (ref 70–99)
Potassium: 5.6 mmol/L — ABNORMAL HIGH (ref 3.5–5.1)
Sodium: 134 mmol/L — ABNORMAL LOW (ref 135–145)
Total Bilirubin: 1 mg/dL (ref 0.0–1.2)
Total Protein: 5.9 g/dL — ABNORMAL LOW (ref 6.5–8.1)

## 2024-04-15 LAB — RAPID URINE DRUG SCREEN, HOSP PERFORMED
Amphetamines: NOT DETECTED
Barbiturates: NOT DETECTED
Benzodiazepines: NOT DETECTED
Cocaine: NOT DETECTED
Opiates: NOT DETECTED
Tetrahydrocannabinol: NOT DETECTED

## 2024-04-15 LAB — DIFFERENTIAL
Abs Immature Granulocytes: 0.01 10*3/uL (ref 0.00–0.07)
Basophils Absolute: 0 10*3/uL (ref 0.0–0.1)
Basophils Relative: 1 %
Eosinophils Absolute: 0.1 10*3/uL (ref 0.0–0.5)
Eosinophils Relative: 4 %
Immature Granulocytes: 0 %
Lymphocytes Relative: 28 %
Lymphs Abs: 0.7 10*3/uL (ref 0.7–4.0)
Monocytes Absolute: 0.2 10*3/uL (ref 0.1–1.0)
Monocytes Relative: 10 %
Neutro Abs: 1.3 10*3/uL — ABNORMAL LOW (ref 1.7–7.7)
Neutrophils Relative %: 57 %

## 2024-04-15 LAB — FOLATE: Folate: 36.1 ng/mL (ref >5.9)

## 2024-04-15 LAB — CBC
HCT: 27.2 % — ABNORMAL LOW (ref 36.0–46.0)
Hemoglobin: 8.6 g/dL — ABNORMAL LOW (ref 12.0–15.0)
MCH: 33.1 pg (ref 26.0–34.0)
MCHC: 31.6 g/dL (ref 30.0–36.0)
MCV: 104.6 fL — ABNORMAL HIGH (ref 80.0–100.0)
Platelets: 202 10*3/uL (ref 150–400)
RBC: 2.6 MIL/uL — ABNORMAL LOW (ref 3.87–5.11)
RDW: 17 % — ABNORMAL HIGH (ref 11.5–15.5)
WBC: 2.4 10*3/uL — ABNORMAL LOW (ref 4.0–10.5)
nRBC: 0 % (ref 0.0–0.2)

## 2024-04-15 LAB — I-STAT CHEM 8, ED
BUN: 17 mg/dL (ref 8–23)
Calcium, Ion: 1.23 mmol/L (ref 1.15–1.40)
Chloride: 102 mmol/L (ref 98–111)
Creatinine, Ser: 1.1 mg/dL — ABNORMAL HIGH (ref 0.44–1.00)
Glucose, Bld: 117 mg/dL — ABNORMAL HIGH (ref 70–99)
HCT: 28 % — ABNORMAL LOW (ref 36.0–46.0)
Hemoglobin: 9.5 g/dL — ABNORMAL LOW (ref 12.0–15.0)
Potassium: 4.8 mmol/L (ref 3.5–5.1)
Sodium: 137 mmol/L (ref 135–145)
TCO2: 23 mmol/L (ref 22–32)

## 2024-04-15 LAB — PROTIME-INR
INR: 1.4 — ABNORMAL HIGH (ref 0.8–1.2)
Prothrombin Time: 17.6 s — ABNORMAL HIGH (ref 11.4–15.2)

## 2024-04-15 LAB — ETHANOL: Alcohol, Ethyl (B): <15 mg/dL (ref <15)

## 2024-04-15 LAB — APTT: aPTT: 38 s — ABNORMAL HIGH (ref 24–36)

## 2024-04-15 LAB — CBG MONITORING, ED: Glucose-Capillary: 125 mg/dL — ABNORMAL HIGH (ref 70–99)

## 2024-04-15 MED ORDER — ENOXAPARIN SODIUM 40 MG/0.4ML IJ SOSY
40.0000 mg | PREFILLED_SYRINGE | INTRAMUSCULAR | Status: DC
  Administered 2024-04-16: 40 mg via SUBCUTANEOUS
  Filled 2024-04-15: qty 0.4

## 2024-04-15 MED ORDER — GADOBUTROL 1 MMOL/ML IV SOLN
6.0000 mL | Freq: Once | INTRAVENOUS | Status: AC | PRN
  Administered 2024-04-15: 6 mL via INTRAVENOUS

## 2024-04-15 MED ORDER — LACTULOSE 10 GM/15ML PO SOLN
30.0000 g | Freq: Three times a day (TID) | ORAL | Status: DC
  Administered 2024-04-15 – 2024-04-16 (×4): 30 g via ORAL
  Filled 2024-04-15 (×4): qty 60

## 2024-04-15 MED ORDER — SENNOSIDES-DOCUSATE SODIUM 8.6-50 MG PO TABS: 1.0000 | ORAL_TABLET | Freq: Every evening | ORAL | Status: DC | PRN

## 2024-04-15 MED ORDER — ACETAMINOPHEN 160 MG/5ML PO SOLN: 650.0000 mg | ORAL | Status: DC | PRN

## 2024-04-15 MED ORDER — ASPIRIN 81 MG PO CHEW
81.0000 mg | CHEWABLE_TABLET | Freq: Every day | ORAL | Status: DC
  Administered 2024-04-15 – 2024-04-16 (×2): 81 mg via ORAL
  Filled 2024-04-15 (×2): qty 1

## 2024-04-15 MED ORDER — ACETAMINOPHEN 650 MG RE SUPP: 650.0000 mg | RECTAL | Status: DC | PRN

## 2024-04-15 MED ORDER — LEVOTHYROXINE SODIUM 25 MCG PO TABS
25.0000 ug | ORAL_TABLET | Freq: Every day | ORAL | Status: DC
  Administered 2024-04-16: 25 ug via ORAL
  Filled 2024-04-15: qty 1

## 2024-04-15 MED ORDER — STROKE: EARLY STAGES OF RECOVERY BOOK
Freq: Once | Status: AC
  Filled 2024-04-15: qty 1

## 2024-04-15 MED ORDER — SODIUM CHLORIDE 0.9 % IV BOLUS
500.0000 mL | Freq: Once | INTRAVENOUS | Status: AC
  Administered 2024-04-15: 500 mL via INTRAVENOUS

## 2024-04-15 MED ORDER — SODIUM CHLORIDE 0.9 % IV SOLN
100.0000 mL/h | INTRAVENOUS | Status: AC
  Administered 2024-04-15 – 2024-04-16 (×2): 100 mL/h via INTRAVENOUS

## 2024-04-15 MED ORDER — ACETAMINOPHEN 325 MG PO TABS: 650.0000 mg | ORAL_TABLET | ORAL | Status: DC | PRN

## 2024-04-15 NOTE — ED Triage Notes (Signed)
 Pt to ED via [facility] from home. Pt c/o dizziness that started while she was cooking. Pt has also had a headache and earache. Pt has heaviness to left eye, no drooping noted. Per EMS dizziness improved during transport.  Pt A&O on arrival, c/o headache, states dizzy when she opens her eyes.   EMS VS 120/57 HR 64 96% RA Cbg 139 18g LAC

## 2024-04-15 NOTE — H&P (Signed)
 History and Physical   [name] ZOX:[id number] DOB: [birth year] DOA: 04/15/2024  PCP: Dorothe Gaster, NP   Patient coming from: Home  Chief Complaint: Dizziness  HPI: [name] is a 73 y.o. female with medical history significant of hypertension, hypothyroidism, tachybradycardia syndrome, CVA, colon cancer, cirrhosis, anemia, adrenal insufficiency presenting with dizziness.  Patient reports initially feeling okay when she woke up this morning and when she took her thyroid  medicine at 9 AM.  A little later she had sudden onset dizziness with some associated headache and leg heaviness.  Also reports some loss of certain visual fields especially when looking up initially.  Denies fever, chills, chest pain, shortness of breath, abdominal pain, constipation, diarrhea, nausea, vomiting.  ED Course: Vital signs in the ED notable for blood pressure in the 110s systolic, heart rate in the 50s-60s.  Lab workup included CMP with sodium 134, potassium 5.6, glucose 115, protein 5.9, albumin  2.4, AST stable at 92, alk phos 147.  CBC with hemoglobin stable at 8.6 and WBC near baseline at 2.4.  PT and INR mildly elevated at 17.6 and 1.4 respectively, PTT also mildly elevated at 38.  Ethanol level negative.  UDS pending.  Lipid panel and A1c pending.  Patient CIWA aspirin  and 500 cc IV fluids and started on a rate of fluids in the ED.  Neurology consulted as patient arrived as a code stroke.  Imaging studies included CT head which showed no acute normality.  MRI brain which showed multiple bilateral acute infarcts left greater than right.  MRA head and neck which showed no significant stenosis.  Neurology following and recommending stroke workup.    Review of Systems: As per HPI otherwise all other systems reviewed and are negative.  Past Medical History:  Diagnosis Date   Acute hepatic encephalopathy (HCC) 03/30/2024   Acute urinary retention 05/04/2022   Allergy 2006 ?   Contrast dye    Arthritis 2016 ??   Knees and thumb   Cancer (HCC)    cecum   Cataract 2021   Surgery scheduled July 2023   Colon cancer [facility]) 2003   Elevated liver function tests    Esophageal varices (HCC)    Heart murmur On file   Hemorrhage of gastrointestinal tract 05/04/2011   Hypertension 2021   Hypothyroidism    Iron deficiency anemia    Liver disease    chemotherapy complication, per pt, shunts placed to bypass liver   Malignant neoplasm of cecum (HCC)    Portal hypertension (HCC)    Skin cancer 2019   Splenomegaly     Past Surgical History:  Procedure Laterality Date   COLON SURGERY  2004   Cancer   COSMETIC SURGERY  2021   Skin cancer   ESOPHAGEAL VARICE LIGATION     EYE SURGERY     HEMICOLECTOMY  01/08/2003   IR RADIOLOGIST EVAL & MGMT  12/20/2020   IR RADIOLOGIST EVAL & MGMT  05/29/2021   LIVER SURGERY     shunts placed after chemo complication   SKIN FULL THICKNESS GRAFT N/A 09/12/2019   Procedure: debridement and FTSG to the nose from left upper arm;  Surgeon: Barb Bonito, MD;  Location: [facility];  Service: Plastics;  Laterality: N/A;  2 hours, please   TIPS PROCEDURE      Social History  reports that she has never smoked. She has never used smokeless tobacco. She reports that she does not drink alcohol  and does not use drugs.  Allergies  Allergen Reactions   Contrast Media [Iodinated Contrast Media] Hives    Family History  Problem Relation Age of Onset   Arthritis Mother    Hearing loss Mother    Heart disease Mother    Hypertension Mother    Miscarriages / [place] Mother    Arthritis Father    Diabetes Father    Heart disease Father    Cancer Maternal Aunt    Breast cancer Neg Hx    Colon cancer Neg Hx    Esophageal cancer Neg Hx    Pancreatic cancer Neg Hx    Stomach cancer Neg Hx    Stroke Neg Hx   Reviewed on admission  Prior to Admission medications   Medication Sig Start Date End Date Taking? Authorizing Provider   Accu-Chek Softclix Lancets lancets 3 (three) times daily. 04/05/24   [provider]  aspirin  EC 81 MG tablet Take 1 tablet (81 mg total) by mouth daily. Swallow whole. 12/26/23   Wynetta Heckle, MD  Blood Glucose Monitoring Suppl DEVI 1 each by Does not apply route in the morning, at noon, and at bedtime. May substitute to any manufacturer covered by patient's insurance. 04/05/24   Daren Eck, DO  CRANBERRY PO Take 1 tablet by mouth 2 (two) times daily.    [provider]  furosemide  (LASIX ) 20 MG tablet Take 20-40 mg by mouth See admin instructions. Take blood pressure and depending on reading take 20mg  or 40mg  by mouth daily.    [provider]  Glucose Blood (BLOOD GLUCOSE TEST STRIPS) STRP 1 each by In Vitro route in the morning, at noon, and at bedtime. May substitute to any manufacturer covered by patient's insurance. 04/05/24 05/05/24  Daren Eck, DO  lactulose  (CHRONULAC ) 10 GM/15ML solution Take 45 mLs (30 g total) by mouth 3 (three) times daily. 03/13/23   Pokhrel, Amador Bad, MD  Lancet Device MISC 1 each by Does not apply route in the morning, at noon, and at bedtime. May substitute to any manufacturer covered by patient's insurance. 04/05/24 05/05/24  Daren Eck, DO  Lancets Misc. MISC 1 each by Does not apply route in the morning, at noon, and at bedtime. May substitute to any manufacturer covered by patient's insurance. 04/05/24 05/05/24  Daren Eck, DO  levOCARNitine  (CARNITOR ) 330 MG tablet Take 330 mg by mouth 3 (three) times daily. 05/23/22   [provider]  levothyroxine  (SYNTHROID ) 25 MCG tablet Take 25 mcg by mouth daily before breakfast.    [provider]  liver oil-zinc  oxide (DESITIN) 40 % ointment Apply topically daily as needed for irritation. 03/13/23   Pokhrel, Amador Bad, MD  magnesium  oxide (MAG-OX) 400 MG tablet Take 1 tablet (400 mg total) by mouth daily. 04/05/24   Daren Eck, DO  Multiple Vitamin (MULTI-VITAMIN) tablet Take  1 tablet by mouth daily.    [provider]  polyethylene glycol (MIRALAX  / GLYCOLAX ) 17 g packet Take 17 g by mouth daily as needed for mild constipation. 03/13/23   Pokhrel, Laxman, MD  rifaximin  (XIFAXAN ) 550 MG TABS tablet Take 1 tablet (550 mg total) by mouth 2 (two) times daily. Patient taking differently: Take 550 mg by mouth as needed. Flareups 12/25/23   Wynetta Heckle, MD  spironolactone  (ALDACTONE ) 25 MG tablet Take 50 mg by mouth daily.    [provider]    Physical Exam: Vitals:   04/15/24 1124 04/15/24 1126  BP:  (!) 112/59  Pulse:  62  Resp:  16  SpO2:  99%  Weight: 65.8 kg   Height: 5\' 4"  (1.626 m)     Physical Exam Constitutional:      General: She is not in acute distress.    Appearance: Normal appearance.  HENT:     Head: Normocephalic and atraumatic.     Mouth/Throat:     Mouth: Mucous membranes are moist.     Pharynx: Oropharynx is clear.  Eyes:     Extraocular Movements: Extraocular movements intact.     Pupils: Pupils are equal, round, and reactive to light.  Cardiovascular:     Rate and Rhythm: Normal rate and regular rhythm.     Pulses: Normal pulses.     Heart sounds: Murmur heard.  Pulmonary:     Effort: Pulmonary effort is normal. No respiratory distress.     Breath sounds: Normal breath sounds.  Abdominal:     General: Bowel sounds are normal. There is no distension.     Palpations: Abdomen is soft.     Tenderness: There is no abdominal tenderness.  Musculoskeletal:        General: No swelling or deformity.  Skin:    General: Skin is warm and dry.  Neurological:     Comments: Mental Status: Patient is awake, alert, oriented x3 No signs of aphasia or neglect Cranial Nerves: II: Pupils equal, round, and reactive to light. Visual field deficit. III,IV, VI: EOMI without ptosis or diploplia.  V: Facial sensation is symmetric to light touch. VII: Facial movement is symmetric.  VIII: hearing is intact to voice X: Uvula  elevates symmetrically XI: Shoulder shrug is symmetric. XII: tongue is midline without atrophy or fasciculations.  Motor: good effort thorughout, at Least 5/5 bilateral UE, 5/5 bilateral lower extremitiy  Sensory: Sensation is grossly intact bilateral UEs & LEs    Labs on Admission: I have personally reviewed following labs and imaging studies  CBC: Recent Labs  Lab 04/10/24 1220 04/15/24 1154 04/15/24 1240  WBC 3.7*  --  2.4*  NEUTROABS  --   --  1.3*  HGB 9.3* 9.5* 8.6*  HCT 27.5* 28.0* 27.2*  MCV 100.8*  --  104.6*  PLT 280.0  --  202    Basic Metabolic Panel: Recent Labs  Lab 04/10/24 1220 04/15/24 1154 04/15/24 1240  NA 134* 137 134*  K 4.8 4.8 5.6*  CL 97 102 102  CO2 33*  --  24  GLUCOSE 99 117* 115*  BUN 19 17 16   CREATININE 1.27* 1.10* 0.99  CALCIUM  9.2  --  9.0  MG 1.9  --   --     GFR: Estimated Creatinine Clearance: 47.9 mL/min (by C-G formula based on SCr of 0.99 mg/dL).  Liver Function Tests: Recent Labs  Lab 04/15/24 1240  AST 92*  ALT 37  ALKPHOS 147*  BILITOT 1.0  PROT 5.9*  ALBUMIN  2.4*    Urine analysis:    Component Value Date/Time   COLORURINE YELLOW 03/31/2024 1517   APPEARANCEUR CLEAR 03/31/2024 1517   LABSPEC 1.010 03/31/2024 1517   PHURINE 9.0 (H) 03/31/2024 1517   GLUCOSEU NEGATIVE 03/31/2024 1517   GLUCOSEU NEGATIVE 08/04/2023 0916   HGBUR NEGATIVE 03/31/2024 1517   BILIRUBINUR NEGATIVE 03/31/2024 1517   BILIRUBINUR negative 12/06/2023 1117   BILIRUBINUR Negative 02/04/2023 0917   KETONESUR NEGATIVE 03/31/2024 1517   PROTEINUR NEGATIVE 03/31/2024 1517   UROBILINOGEN 0.2 12/06/2023 1117   UROBILINOGEN 0.2 08/04/2023 0916   NITRITE NEGATIVE 03/31/2024 1517   LEUKOCYTESUR NEGATIVE 03/31/2024 1517  Radiological Exams on Admission: MR BRAIN W WO CONTRAST Result Date: 04/15/2024 EXAM: MRI BRAIN WITH AND WITHOUT CONTRAST 04/15/2024 12:57:31 PM TECHNIQUE: Multiplanar multisequence MRI of the head/brain was performed  with and without the administration of intravenous contrast. COMPARISON: CT head without contrast 04/15/2024. MRI head without contrast 12/22/2023. CLINICAL HISTORY: Neuro deficit, acute, stroke suspected. FINDINGS: BRAIN AND VENTRICLES: Bilateral acute posterior cerebellar infarcts are present, left greater than right. A subcortical acute infarct in the left occipital lobe measures 8 mm. A medial right occipital lobe cortical infarct is present on image 70 of series 5. Punctate acute infarcts are present in the thalami bilaterally. 10 mm subcortical white matter infarct is present in the anterior left frontal lobe. T2 and FLAIR hyperintensities are associated with the areas of acute infarction. Scattered subcortical T2 hyperintensities are moderately advanced for age, separate from the areas of acute infarction. No acute intracranial hemorrhage. No mass effect or midline shift. No hydrocephalus. The sella is unremarkable. Normal flow voids. No mass or abnormal enhancement. ORBITS: No acute abnormality. SINUSES: No acute abnormality. BONES AND SOFT TISSUES: Normal bone marrow signal and enhancement. No acute soft tissue abnormality. IMPRESSION: 1. Multiple bilateral acute non-hemorrhagic infarcts involving the posterior circulation, left greater than right, and the anterior circulation on the left. 2. Scattered subcortical T2 hyperintensities are moderately advanced for age. This likely reflects the sequelae of chronic microvascular ischemia. Electronically signed by: Audree Leas MD 04/15/2024 01:53 PM EDT RP Workstation: [id number]   MR ANGIO HEAD WO CONTRAST Result Date: 04/15/2024 EXAM: MR Angiography Head without intravenous Contrast. 04/15/2024 12:57:31 PM TECHNIQUE: Magnetic resonance angiography images of the head without intravenous contrast. Three-dimensional MIP reformations performed. COMPARISON: CT head without contrast 04/15/2024. MR angio head without contrast 12/24/2023. CLINICAL HISTORY:  Neuro deficit, acute, stroke suspected. FINDINGS: ANTERIOR CIRCULATION: No significant stenosis of the internal carotid arteries. No significant stenosis of the anterior cerebral arteries. No significant stenosis of the middle cerebral arteries. No aneurysm. POSTERIOR CIRCULATION: No significant stenosis of the posterior cerebral arteries. No significant stenosis of the basilar artery. No significant stenosis of the vertebral arteries. No aneurysm. IMPRESSION: 1. No significant stenosis of the intracranial vasculature. Electronically signed by: Audree Leas MD 04/15/2024 01:40 PM EDT RP Workstation: [id number]   MR ANGIO NECK W WO CONTRAST Result Date: 04/15/2024 EXAM: MRA Neck without and with contrast 04/15/2024 12:57:31 PM TECHNIQUE: Multiplanar multisequence MRA of the neck was performed without and with the administration of 6 mL intravenous gadobutrol (GADAVIST) 1 MMOL/ML injection. COMPARISON: CT head without contrast 04/15/2024 CLINICAL HISTORY: Carotid artery stenosis screening, risk factors. FINDINGS: CAROTID ARTERIES: No dissection, arterial injury, or hemodynamically significant stenosis by NASCET criteria. VERTEBRAL ARTERIES: No dissection, arterial injury, or significant stenosis. IMPRESSION: 1. No evidence of carotid or vertebral artery dissection, arterial injury, or hemodynamically significant stenosis. Electronically signed by: Audree Leas MD 04/15/2024 01:35 PM EDT RP Workstation: [id number]   CT HEAD CODE STROKE WO CONTRAST Result Date: 04/15/2024 CLINICAL DATA:  Code stroke.  Vision change and sudden headache. EXAM: CT HEAD WITHOUT CONTRAST TECHNIQUE: Contiguous axial images were obtained from the base of the skull through the vertex without intravenous contrast. RADIATION DOSE REDUCTION: This exam was performed according to the departmental dose-optimization program which includes automated exposure control, adjustment of the mA and/or kV according to patient size and/or  use of iterative reconstruction technique. COMPARISON:  Brain MRI 12/22/2023 FINDINGS: Brain: No evidence of acute infarction, hemorrhage, hydrocephalus, extra-axial collection or mass lesion/mass effect. Mild patchy low-density in the cerebral  white matter attributed, under appreciated compared to prior brain MRI. Brain volume is normal Vascular: No hyperdense vessel or unexpected calcification. Skull: Normal. Negative for fracture or focal lesion. Sinuses/Orbits: No acute finding.  Bilateral cataract resection Other: Prelim sent in epic chat ASPECTS [facility] Stroke Program Early CT Score) Not scored with this history IMPRESSION: 1. No acute finding. 2. Chronic white matter disease known from brain MRI earlier this year. Electronically Signed   By: Ronnette Coke M.D.   On: 04/15/2024 12:01   EKG: Independently reviewed.  Sinus rhythm at 60 beats minute.  Nonspecific T wave changes.  Minimal inferior Q waves.  Similar to previous.  Assessment/Plan Principal Problem:   Acute CVA (cerebrovascular accident) (HCC) Active Problems:   Adrenal insufficiency (HCC)   Decompensated liver cirrhosis with portal HTN and gastric varices   Hypothyroidism   Tachycardia-bradycardia syndrome (HCC)   Hx of colon cancer, stage III   Hypertension   Anemia   History of stroke   Acute CVA History of prior CVA > Presenting with sudden onset dizziness with associated lightheadedness and some visual field deficits. > MRI brain confirmed multiple bilateral acute infarcts left greater than right.  MRA head neck without significant stenosis. > History of prior CVA and PFO.  Decision was made previously not to close PFO due to low risk of recurrent stroke. - Neurology consult - Allow for permissive HTN (systolic < 220 and diastolic < 120)  - ASA 325 mg / 81 mg daily  - Statin pending lipid panel results. - Echocardiogram  - A1C  - Lipid panel  - Tele monitoring  - SLP eval - PT/OT - Lower extremity  DVT  Cirrhosis > History of varices, portal hypertension, hepatic encephalopathy. > Status post TIPS - Holding diuretics as above - Continue home lactulose   Hypothyroidism - Continue home Synthroid   Anemia Leukopenia History of thrombocytopenia > Hemoglobin near baseline at 8.6, WBC near baseline at 2.4, platelets normal today.  MCV elevated. - Check B12 and folate - Trend CBC  History of colon cancer - Noted  History of tachybradycardia syndrome - Noted  History of adrenal insufficiency - Noted  DVT prophylaxis: Lovenox  Code Status:   Full Family Communication:  Updated at bedside  Disposition Plan:   Patient is from:  Home  Anticipated DC to:  Home  Anticipated DC date:  1 to 3 days  Anticipated DC barriers: None  Consults called:  Neurology Admission status:  Observation, telemetry  Severity of Illness: The appropriate patient status for this patient is OBSERVATION. Observation status is judged to be reasonable and necessary in order to provide the required intensity of service to ensure the patient's safety. The patient's presenting symptoms, physical exam findings, and initial radiographic and laboratory data in the context of their medical condition is felt to place them at decreased risk for further clinical deterioration. Furthermore, it is anticipated that the patient will be medically stable for discharge from the hospital within 2 midnights of admission.    Johnetta Nab MD [facility]  How to contact the [facility] Attending or Consulting provider 7A - 7P or covering provider during after hours 7P -7A, for this patient?   Check the care team in [facility] and look for a) attending/consulting [facility] provider listed and b) the [facility] team listed Log into [email or web address] and use [place] universal password to access. If you do not have the password, please contact the hospital operator. Locate the [facility] provider you are looking for under [facility] and page  to a number that you can be directly reached. If you still have difficulty reaching the provider, please page the [facility] (Director on Call) for the Hospitalists listed on amion for assistance.  04/15/2024, 2:39 PM

## 2024-04-15 NOTE — ED Notes (Addendum)
 CCMD notified via telephone.

## 2024-04-15 NOTE — ED Notes (Addendum)
 Code stroke activated by EDP.   Pt transported to CT. Labs collected and sent by phlebotomy.

## 2024-04-15 NOTE — ED Notes (Signed)
 3W and transport contacted.

## 2024-04-15 NOTE — ED Notes (Addendum)
 Pt transported to MRI by ED paramedics.

## 2024-04-15 NOTE — Consult Note (Signed)
 NEUROLOGY CONSULT NOTE   Date of service: Apr 15, 2024 Patient Name: [name] MRN:  [id number] DOB:  27-Jul-1951 Chief Complaint: "sudden onset dizziness" Requesting Provider: Trish Furl, MD  History of Present Illness  Deshara Rossi is a 73 y.o. female with hx of colon cancer s/p resection and chemotherapy, hypertension, hypothyroidism, cataracts, GI bleeding, esophageal varices, chemotherapy induced cirrhosis s/p TIPS in 2012 and thrombocytopenia who presents with sudden onset dizziness.  Patient was in her normal state of health when she awoke this morning.  At approximately 1000, she experienced acute onset dizziness.    LKW: 1000 Modified rankin score: 0-Completely asymptomatic and back to baseline post- stroke IV Thrombolysis: No, coagulopathy, INR 1.5 EVT: No, no LVO  NIHSS components Score: Comment  1a Level of Conscious 0[x]  1[]  2[]  3[]      1b LOC Questions 0[]  1[x]  2[]       1c LOC Commands 0[x]  1[]  2[]       2 Best Gaze 0[x]  1[]  2[]       3 Visual 0[]  1[x]  2[]  3[]      4 Facial Palsy 0[x]  1[]  2[]  3[]      5a Motor Arm - left 0[x]  1[]  2[]  3[]  4[]  UN[]    5b Motor Arm - Right 0[x]  1[]  2[]  3[]  4[]  UN[]    6a Motor Leg - Left 0[]  1[x]  2[]  3[]  4[]  UN[]    6b Motor Leg - Right 0[]  1[x]  2[]  3[]  4[]  UN[]    7 Limb Ataxia 0[x]  1[]  2[]  UN[]      8 Sensory 0[x]  1[]  2[]  UN[]      9 Best Language 0[x]  1[]  2[]  3[]      10 Dysarthria 0[x]  1[]  2[]  UN[]      11 Extinct. and Inattention 0[x]  1[]  2[]       TOTAL:3       ROS  Comprehensive ROS performed and pertinent positives documented in HPI   Past History   Past Medical History:  Diagnosis Date   Acute urinary retention 05/04/2022   Allergy 2006 ?   Contrast dye   Arthritis 2016 ??   Knees and thumb   Cancer (HCC)    cecum   Cataract 2021   Surgery scheduled July 2023   Colon cancer [facility]) 2003   Elevated liver function tests    Esophageal varices (HCC)    Heart murmur On file   Hemorrhage of gastrointestinal tract 05/04/2011    Hypertension 2021   Hypothyroidism    Iron deficiency anemia    Liver disease    chemotherapy complication, per pt, shunts placed to bypass liver   Malignant neoplasm of cecum (HCC)    Portal hypertension (HCC)    Skin cancer 2019   Splenomegaly     Past Surgical History:  Procedure Laterality Date   COLON SURGERY  2004   Cancer   COSMETIC SURGERY  2021   Skin cancer   ESOPHAGEAL VARICE LIGATION     EYE SURGERY     HEMICOLECTOMY  01/08/2003   IR RADIOLOGIST EVAL & MGMT  12/20/2020   IR RADIOLOGIST EVAL & MGMT  05/29/2021   LIVER SURGERY     shunts placed after chemo complication   SKIN FULL THICKNESS GRAFT N/A 09/12/2019   Procedure: debridement and FTSG to the nose from left upper arm;  Surgeon: Barb Bonito, MD;  Location: [facility];  Service: Plastics;  Laterality: N/A;  2 hours, please   TIPS PROCEDURE      Family History: Family History  Problem Relation  Age of Onset   Arthritis Mother    Hearing loss Mother    Heart disease Mother    Hypertension Mother    Miscarriages / [place] Mother    Arthritis Father    Diabetes Father    Heart disease Father    Cancer Maternal Aunt    Breast cancer Neg Hx    Colon cancer Neg Hx    Esophageal cancer Neg Hx    Pancreatic cancer Neg Hx    Stomach cancer Neg Hx    Stroke Neg Hx     Social History  reports that she has never smoked. She has never used smokeless tobacco. She reports that she does not drink alcohol  and does not use drugs.  Allergies  Allergen Reactions   Contrast Media [Iodinated Contrast Media] Hives    Medications   Current Facility-Administered Medications:    sodium chloride  0.9 % bolus 500 mL, 500 mL, Intravenous, Once **FOLLOWED BY** 0.9 %  sodium chloride  infusion, 100 mL/hr, Intravenous, Continuous, Trish Furl, MD  Current Outpatient Medications:    Accu-Chek Softclix Lancets lancets, 3 (three) times daily., Disp: , Rfl:    aspirin  EC 81 MG tablet, Take 1 tablet  (81 mg total) by mouth daily. Swallow whole., Disp: 90 tablet, Rfl: 0   Blood Glucose Monitoring Suppl DEVI, 1 each by Does not apply route in the morning, at noon, and at bedtime. May substitute to any manufacturer covered by patient's insurance., Disp: 1 each, Rfl: 0   CRANBERRY PO, Take 1 tablet by mouth 2 (two) times daily., Disp: , Rfl:    furosemide  (LASIX ) 20 MG tablet, Take 20-40 mg by mouth See admin instructions. Take blood pressure and depending on reading take 20mg  or 40mg  by mouth daily., Disp: , Rfl:    Glucose Blood (BLOOD GLUCOSE TEST STRIPS) STRP, 1 each by In Vitro route in the morning, at noon, and at bedtime. May substitute to any manufacturer covered by patient's insurance., Disp: 100 strip, Rfl: 0   lactulose  (CHRONULAC ) 10 GM/15ML solution, Take 45 mLs (30 g total) by mouth 3 (three) times daily., Disp: 236 mL, Rfl: 0   Lancet Device MISC, 1 each by Does not apply route in the morning, at noon, and at bedtime. May substitute to any manufacturer covered by patient's insurance., Disp: 1 each, Rfl: 0   Lancets Misc. MISC, 1 each by Does not apply route in the morning, at noon, and at bedtime. May substitute to any manufacturer covered by patient's insurance., Disp: 100 each, Rfl: 0   levOCARNitine  (CARNITOR ) 330 MG tablet, Take 330 mg by mouth 3 (three) times daily., Disp: , Rfl:    levothyroxine  (SYNTHROID ) 25 MCG tablet, Take 25 mcg by mouth daily before breakfast., Disp: , Rfl:    liver oil-zinc  oxide (DESITIN) 40 % ointment, Apply topically daily as needed for irritation., Disp: 56.7 g, Rfl: 0   magnesium  oxide (MAG-OX) 400 MG tablet, Take 1 tablet (400 mg total) by mouth daily., Disp: 30 tablet, Rfl: 0   Multiple Vitamin (MULTI-VITAMIN) tablet, Take 1 tablet by mouth daily., Disp: , Rfl:    polyethylene glycol (MIRALAX  / GLYCOLAX ) 17 g packet, Take 17 g by mouth daily as needed for mild constipation., Disp: 14 each, Rfl: 0   rifaximin  (XIFAXAN ) 550 MG TABS tablet, Take 1 tablet  (550 mg total) by mouth 2 (two) times daily. (Patient taking differently: Take 550 mg by mouth as needed. Flareups), Disp: 60 tablet, Rfl: 0   spironolactone  (ALDACTONE )  25 MG tablet, Take 50 mg by mouth daily., Disp: , Rfl:   Vitals   Vitals:   04/15/24 1124 04/15/24 1126  BP:  (!) 112/59  Pulse:  62  Resp:  16  SpO2:  99%  Weight: 65.8 kg   Height: 5\' 4"  (1.626 m)     Body mass index is 24.89 kg/m.  Physical Exam   Constitutional: Appears well-developed and well-nourished.  Psych: Affect appropriate to situation.  Eyes: No scleral injection.  HENT: No OP obstruction.  Head: Normocephalic.  Respiratory: Effort normal, non-labored breathing.  Skin: WDI.   Neurologic Examination    NEURO:  Mental Status: AA&Ox3  Speech/Language: speech is without dysarthria or aphasia.  Naming, repetition, fluency, and comprehension intact.  Cranial Nerves:  II: PERRL. Right lower quadrantanopsia, inconsistently observed III, IV, VI: EOMI. Eyelids elevate symmetrically.  V: Sensation is intact to light touch and symmetrical to face.  VII: Smile is symmetrical.  VIII: hearing intact to voice. IX, X: Phonation is normal.  XII: tongue is midline without fasciculations. Motor: Able to move all four extremities with antigravity strength, drift in bilateral lower extremities Tone: is normal and bulk is normal Sensation- Intact to light touch bilaterally. Extinction absent to light touch to DSS. Coordination: FTN intact bilaterally Gait- deferred   Labs/Imaging/Neurodiagnostic studies   CBC:  Recent Labs  Lab 04-30-2024 1220 04/15/24 1154  WBC 3.7*  --   HGB 9.3* 9.5*  HCT 27.5* 28.0*  MCV 100.8*  --   PLT 280.0  --    Basic Metabolic Panel:  Lab Results  Component Value Date   NA 137 04/15/2024   K 4.8 04/15/2024   CO2 33 (H) 04-30-2024   GLUCOSE 117 (H) 04/15/2024   BUN 17 04/15/2024   CREATININE 1.10 (H) 04/15/2024   CALCIUM  9.2 04/30/2024   GFRNONAA 56 (L) 04/05/2024    GFRAA >60 08/17/2020   Lipid Panel:  Lab Results  Component Value Date   LDLCALC 72 12/23/2023   HgbA1c:  Lab Results  Component Value Date   HGBA1C 4.8 12/23/2023   Alcohol  Level     Component Value Date/Time   ETH <10 04/21/2023 1750   INR  Lab Results  Component Value Date   INR 1.5 (H) 04/01/2024   APTT  Lab Results  Component Value Date   APTT 38 (H) 02/09/2023   CT Head without contrast(Personally reviewed): No acute abnormality  CT angio Head and Neck with contrast(Personally reviewed): Unable to be performed due to contrast allergy  MR Angio head and neck(Personally reviewed): No LVO, diminutive right vertebral artery  MRI Brain w/wo contrast(Personally reviewed): Multiple diffusion restrictions in bilateral cerebral and cerebellar hemispheres  ASSESSMENT   Jahaira Earnhart is a 73 y.o. female  with hx of colon cancer s/p resection and chemotherapy, hypertension, hypothyroidism, cataracts, GI bleeding, esophageal varices, chemotherapy induced cirrhosis s/p TIPS in 2012 and thrombocytopenia who presents with sudden onset dizziness.  Patient was in her usual state of health this morning until 1000, when she experienced sudden onset dizziness.  Initial CT head revealed no acute abnormalities.  Due to contrast allergy, CTA could not be performed, and patient was taken for MRI, which demonstrated multiple acute ischemic infarcts in bilateral cerebellar and cerebral hemispheres, embolic in appearance.  Will admit patient for stroke workup; patient will likely need outpatient cardiac monitoring to assess for a-fib.  Upon discussion with patient's husband, it was revealed that patient does have a PFO which may have been the  cause of a stroke that she had previously.  Decision had been made not to close PFO due to low risk of recurrent stroke, but this may need to be revisited.  Will obtain lower extremity ultrasound to rule out DVT in the setting.  RECOMMENDATIONS   Emergent recommendations - MRI brain, MRA head and neck to clear posterior circulation and confirm no emergent posterior circulation process to address via thrombectomy  Stroke/TIA Workup # Multifocal strokes, concerning for central embolic source - Admit for stroke workup - Permissive HTN x48 hrs from sx onset goal BP <220/110. PRN labetalol or hydralazine  if BP above these parameters. Avoid oral antihypertensives. - TTE  - Bilateral lower extremity ultrasound to r/o DVT - Check A1c and LDL + add statin per guidelines - aspirin  81 mg daily antiplt/anticoag, will hold off on Plavix due to history of thrombocytopenia and coagulopathy related to liver dysfunction.  If a-fib is found, will need to discuss risk/benefit of anticoagulation vs. Watchman procedure. - q4 hr neuro checks - STAT head CT for any change in neuro exam - Tele - PT/OT/SLP - Stroke education - Amb referral to neurology upon discharge   ______________________________________________________________________  Patient seen by NP with MD, MD to edit note as needed.  Signed, Cortney E Bucky Cardinal, NP [facility]   Attending Neurologist's note:  I personally saw this patient, gathering history, performing a full neurologic examination, reviewing relevant labs, personally reviewing relevant imaging including head CT, MRI brain, MRA head, MRA neck, and formulated the assessment and plan, adding the note above for completeness and clarity to accurately reflect my thoughts  Baldwin Levee MD-PhD [facility] [phone]  CRITICAL CARE Performed by: Ronnette Coke   Total critical care time: 50 minutes  Critical care time was exclusive of separately billable procedures and treating other patients.  Critical care was necessary to treat or prevent imminent or life-threatening deterioration.  Critical care was time spent personally by me on the following activities: development of treatment plan  with patient and/or surrogate as well as nursing, discussions with consultants, evaluation of patient's response to treatment, examination of patient, obtaining history from patient or surrogate, ordering and performing treatments and interventions, ordering and review of laboratory studies, ordering and review of radiographic studies, pulse oximetry and re-evaluation of patient's condition.

## 2024-04-15 NOTE — ED Notes (Signed)
 New NIH value as zero. Pt able to hold both legs higher and with no drift compared to first assessment. No visual deficits on assessment.

## 2024-04-15 NOTE — Code Documentation (Signed)
 Stroke Response Nurse Documentation Code Documentation  [name] is a 73 y.o. female arriving to [facility]  via [facility] on 04-15-24 with past medical hx of CA, liver cirrhosis. On aspirin  81 mg daily. Code stroke was activated by ED.   Patient from home where she was LKW at 1000 and now complaining of dizziness and feeling like "jello".  She awoke her normal self and took her morning meds.  About 10am when she was cooking breakfast she became dizzy.   Stroke team at the bedside on patient arrival. Labs drawn and patient cleared for CT by Dr. Monnie Anthony. Patient to CT with team. NIHSS 3, see documentation for details and code stroke times. Patient with right hemianopia and bilateral leg weakness on exam. The following imaging was completed:  CT Head and MRI. Patient is not a candidate for IV Thrombolytic due to INR.  Patient is not a candidate for IR due to no LVO on MRA.   Care Plan: VS and NIHSS q 2hours x 12 hours.   Bedside handoff with ED paramedic Dylan.    Waldemar Guillaume  Stroke Response RN  m

## 2024-04-15 NOTE — ED Notes (Signed)
 Contrast allergy verified by EDP and stroke team made aware while in CT.

## 2024-04-15 NOTE — ED Notes (Signed)
 Pt ambulated to the bathroom with this provider at 1333

## 2024-04-15 NOTE — ED Provider Notes (Signed)
 [place] EMERGENCY DEPARTMENT AT [facility] Provider Note   CSN: [id number] Arrival date & time: 04/15/24  1116  An emergency department physician performed an initial assessment on this suspected stroke patient at 1140.  History  Chief Complaint  Patient presents with   Dizziness    [name] is a 73 y.o. female.   Dizziness    Patient has a history of hypertension hypothyroidism liver disease cataracts.  Patient states she woke up this morning and felt fine.  She took her medicine around 9:00.  Patient states she has to wait an hour after taking her medicine before she can eat so she did not start cooking right away.  Patient states she suddenly started having trouble with acute dizziness.  Patient states she is also developed a headache.  She feels some heaviness in her legs.  Patient denies any trouble with her speech.  She denied any blurred vision but does feel like she is losing parts of her vision when she looks up  Home Medications Prior to Admission medications   Medication Sig Start Date End Date Taking? Authorizing Provider  Accu-Chek Softclix Lancets lancets 3 (three) times daily. 04/05/24   [provider]  aspirin  EC 81 MG tablet Take 1 tablet (81 mg total) by mouth daily. Swallow whole. 12/26/23   Wynetta Heckle, MD  Blood Glucose Monitoring Suppl DEVI 1 each by Does not apply route in the morning, at noon, and at bedtime. May substitute to any manufacturer covered by patient's insurance. 04/05/24   Daren Eck, DO  CRANBERRY PO Take 1 tablet by mouth 2 (two) times daily.    [provider]  furosemide  (LASIX ) 20 MG tablet Take 20-40 mg by mouth See admin instructions. Take blood pressure and depending on reading take 20mg  or 40mg  by mouth daily.    [provider]  Glucose Blood (BLOOD GLUCOSE TEST STRIPS) STRP 1 each by In Vitro route in the morning, at noon, and at bedtime. May substitute to any manufacturer covered by  patient's insurance. 04/05/24 05/05/24  Daren Eck, DO  lactulose  (CHRONULAC ) 10 GM/15ML solution Take 45 mLs (30 g total) by mouth 3 (three) times daily. 03/13/23   Pokhrel, Amador Bad, MD  Lancet Device MISC 1 each by Does not apply route in the morning, at noon, and at bedtime. May substitute to any manufacturer covered by patient's insurance. 04/05/24 05/05/24  Daren Eck, DO  Lancets Misc. MISC 1 each by Does not apply route in the morning, at noon, and at bedtime. May substitute to any manufacturer covered by patient's insurance. 04/05/24 05/05/24  Daren Eck, DO  levOCARNitine  (CARNITOR ) 330 MG tablet Take 330 mg by mouth 3 (three) times daily. 05/23/22   [provider]  levothyroxine  (SYNTHROID ) 25 MCG tablet Take 25 mcg by mouth daily before breakfast.    [provider]  liver oil-zinc  oxide (DESITIN) 40 % ointment Apply topically daily as needed for irritation. 03/13/23   Pokhrel, Amador Bad, MD  magnesium  oxide (MAG-OX) 400 MG tablet Take 1 tablet (400 mg total) by mouth daily. 04/05/24   Daren Eck, DO  Multiple Vitamin (MULTI-VITAMIN) tablet Take 1 tablet by mouth daily.    [provider]  polyethylene glycol (MIRALAX  / GLYCOLAX ) 17 g packet Take 17 g by mouth daily as needed for mild constipation. 03/13/23   Pokhrel, Laxman, MD  rifaximin  (XIFAXAN ) 550 MG TABS tablet Take 1 tablet (550 mg total) by mouth 2 (two) times daily. Patient taking differently:  Take 550 mg by mouth as needed. Flareups 12/25/23   Wynetta Heckle, MD  spironolactone  (ALDACTONE ) 25 MG tablet Take 50 mg by mouth daily.    [provider]      Allergies    Contrast media [iodinated contrast media]    Review of Systems   Review of Systems  Neurological:  Positive for dizziness.    Physical Exam Updated Vital Signs BP (!) 112/59 (BP Location: Left Arm)   Pulse 62   Resp 16   Ht 1.626 m (5\' 4" )   Wt 65.8 kg   SpO2 99%   BMI 24.89 kg/m  Physical Exam Vitals and nursing  note reviewed.  Constitutional:      General: She is not in acute distress.    Appearance: She is well-developed.  HENT:     Head: Normocephalic and atraumatic.     Right Ear: External ear normal.     Left Ear: External ear normal.  Eyes:     General: No visual field deficit or scleral icterus.       Right eye: No discharge.        Left eye: No discharge.     Conjunctiva/sclera: Conjunctivae normal.  Neck:     Trachea: No tracheal deviation.  Cardiovascular:     Rate and Rhythm: Normal rate and regular rhythm.  Pulmonary:     Effort: Pulmonary effort is normal. No respiratory distress.     Breath sounds: Normal breath sounds. No stridor. No wheezing or rales.  Abdominal:     General: Bowel sounds are normal. There is no distension.     Palpations: Abdomen is soft.     Tenderness: There is no abdominal tenderness. There is no guarding or rebound.  Musculoskeletal:        General: No tenderness.     Cervical back: Neck supple.  Skin:    General: Skin is warm and dry.     Findings: No rash.  Neurological:     Mental Status: She is alert and oriented to person, place, and time.     Cranial Nerves: No cranial nerve deficit, dysarthria or facial asymmetry.     Sensory: No sensory deficit.     Motor: No abnormal muscle tone, seizure activity or pronator drift.     Coordination: Coordination normal.     Comments:  able to hold both legs off bed for 5 seconds, sensation intact in all extremities,  no left or right sided neglect, abnormal finger-nose exam , no nystagmus noted, visual field cut superiorly   Psychiatric:        Mood and Affect: Mood normal.     ED Results / Procedures / Treatments   Labs (all labs ordered are listed, but only abnormal results are displayed) Labs Reviewed  PROTIME-INR - Abnormal; Notable for the following components:      Result Value   Prothrombin Time 17.6 (*)    INR 1.4 (*)    All other components within normal limits  APTT - Abnormal;  Notable for the following components:   aPTT 38 (*)    All other components within normal limits  CBC - Abnormal; Notable for the following components:   WBC 2.4 (*)    RBC 2.60 (*)    Hemoglobin 8.6 (*)    HCT 27.2 (*)    MCV 104.6 (*)    RDW 17.0 (*)    All other components within normal limits  DIFFERENTIAL - Abnormal; Notable for the  following components:   Neutro Abs 1.3 (*)    All other components within normal limits  CBG MONITORING, ED - Abnormal; Notable for the following components:   Glucose-Capillary 125 (*)    All other components within normal limits  I-STAT CHEM 8, ED - Abnormal; Notable for the following components:   Creatinine, Ser 1.10 (*)    Glucose, Bld 117 (*)    Hemoglobin 9.5 (*)    HCT 28.0 (*)    All other components within normal limits  ETHANOL  COMPREHENSIVE METABOLIC PANEL WITH GFR  RAPID URINE DRUG SCREEN, HOSP PERFORMED    EKG None  Radiology CT HEAD CODE STROKE WO CONTRAST Result Date: 04/15/2024 CLINICAL DATA:  Code stroke.  Vision change and sudden headache. EXAM: CT HEAD WITHOUT CONTRAST TECHNIQUE: Contiguous axial images were obtained from the base of the skull through the vertex without intravenous contrast. RADIATION DOSE REDUCTION: This exam was performed according to the departmental dose-optimization program which includes automated exposure control, adjustment of the mA and/or kV according to patient size and/or use of iterative reconstruction technique. COMPARISON:  Brain MRI 12/22/2023 FINDINGS: Brain: No evidence of acute infarction, hemorrhage, hydrocephalus, extra-axial collection or mass lesion/mass effect. Mild patchy low-density in the cerebral white matter attributed, under appreciated compared to prior brain MRI. Brain volume is normal Vascular: No hyperdense vessel or unexpected calcification. Skull: Normal. Negative for fracture or focal lesion. Sinuses/Orbits: No acute finding.  Bilateral cataract resection Other: Prelim sent in  epic chat ASPECTS [facility] Stroke Program Early CT Score) Not scored with this history IMPRESSION: 1. No acute finding. 2. Chronic white matter disease known from brain MRI earlier this year. Electronically Signed   By: Ronnette Coke M.D.   On: 04/15/2024 12:01    Procedures .Critical Care  Performed by: Trish Furl, MD Authorized by: Trish Furl, MD   Critical care provider statement:    Critical care time (minutes):  30   Critical care was time spent personally by me on the following activities:  Development of treatment plan with patient or surrogate, discussions with consultants, evaluation of patient's response to treatment, examination of patient, ordering and review of laboratory studies, ordering and review of radiographic studies, ordering and performing treatments and interventions, pulse oximetry, re-evaluation of patient's condition and review of old charts     Medications Ordered in ED Medications  sodium chloride  0.9 % bolus 500 mL (has no administration in time range)    Followed by  0.9 %  sodium chloride  infusion (has no administration in time range)   stroke: early stages of recovery book (has no administration in time range)  aspirin  chewable tablet 81 mg (has no administration in time range)  gadobutrol (GADAVIST) 1 MMOL/ML injection 6 mL (6 mLs Intravenous Contrast Given 04/15/24 1301)    ED Course/ Medical Decision Making/ A&P Clinical Course as of 04/15/24 1543  Sat Apr 15, 2024  1150 Patient's presentation concerning for the possibility of an acute stroke.  Code stroke activated after my exam.  Last known normal was 10 AM.  Patient has evidence of abnormal finger-nose exam and visual field cuts [JK]  1310 CBC shows leukopenia and anemia, this is similar to previous values.  No significant abnormalities on metabolic panel. [JK]  1311 Head CT without acute findings. [JK]  1432 Case discussed with Dr Rufina Cough regarding admission [JK]    Clinical Course User  Index [JK] Trish Furl, MD  Medical Decision Making Problems Addressed: Cerebrovascular accident (CVA), unspecified mechanism (HCC): acute illness or injury that poses a threat to life or bodily functions  Amount and/or Complexity of Data Reviewed Labs: ordered. Decision-making details documented in ED Course. Radiology: ordered and independent interpretation performed.  Risk Prescription drug management. Decision regarding hospitalization.   Patient presented to the ED for evaluation of acute onset of dizziness.  Patient also was complaining of some visual field cuts.  Presentation concerning for the possibility of acute stroke.  Code stroke was activated after my evaluation.  Patient not felt to be a TNK candidate with her history of GI bleeding esophageal varices chemotherapy-induced cirrhosis thrombocytopenia.  Patient had an emergent MRI in the ED that did confirm multiple acute ischemic infarcts.  Patient will be admitted to the hospital for further stroke workup.        Final Clinical Impression(s) / ED Diagnoses Final diagnoses:  Cerebrovascular accident (CVA), unspecified mechanism [facility])    Rx / DC Orders ED Discharge Orders     None         Trish Furl, MD 04/15/24 [phone]

## 2024-04-16 ENCOUNTER — Observation Stay (HOSPITAL_BASED_OUTPATIENT_CLINIC_OR_DEPARTMENT_OTHER)

## 2024-04-16 ENCOUNTER — Other Ambulatory Visit: Payer: Self-pay | Admitting: Home Health

## 2024-04-16 ENCOUNTER — Observation Stay (HOSPITAL_COMMUNITY)

## 2024-04-16 DIAGNOSIS — I639 Cerebral infarction, unspecified: Secondary | ICD-10-CM

## 2024-04-16 DIAGNOSIS — Q2112 Patent foramen ovale: Secondary | ICD-10-CM | POA: Diagnosis not present

## 2024-04-16 DIAGNOSIS — K7460 Unspecified cirrhosis of liver: Secondary | ICD-10-CM | POA: Diagnosis not present

## 2024-04-16 DIAGNOSIS — I6389 Other cerebral infarction: Secondary | ICD-10-CM

## 2024-04-16 LAB — COMPREHENSIVE METABOLIC PANEL WITH GFR
ALT: 32 U/L (ref 0–44)
AST: 53 U/L — ABNORMAL HIGH (ref 15–41)
Albumin: 1.9 g/dL — ABNORMAL LOW (ref 3.5–5.0)
Alkaline Phosphatase: 105 U/L (ref 38–126)
Anion gap: 6 (ref 5–15)
BUN: 16 mg/dL (ref 8–23)
CO2: 22 mmol/L (ref 22–32)
Calcium: 8.1 mg/dL — ABNORMAL LOW (ref 8.9–10.3)
Chloride: 109 mmol/L (ref 98–111)
Creatinine, Ser: 0.97 mg/dL (ref 0.44–1.00)
GFR, Estimated: >60 mL/min (ref >60)
Glucose, Bld: 69 mg/dL — ABNORMAL LOW (ref 70–99)
Potassium: 5 mmol/L (ref 3.5–5.1)
Sodium: 137 mmol/L (ref 135–145)
Total Bilirubin: 1 mg/dL (ref 0.0–1.2)
Total Protein: 4.9 g/dL — ABNORMAL LOW (ref 6.5–8.1)

## 2024-04-16 LAB — LIPID PANEL
Cholesterol: 117 mg/dL (ref 0–200)
HDL: 43 mg/dL (ref >40)
LDL Cholesterol: 69 mg/dL (ref 0–99)
Total CHOL/HDL Ratio: 2.7 ratio
Triglycerides: 26 mg/dL (ref <150)
VLDL: 5 mg/dL (ref 0–40)

## 2024-04-16 LAB — CBC
HCT: 23 % — ABNORMAL LOW (ref 36.0–46.0)
Hemoglobin: 7.3 g/dL — ABNORMAL LOW (ref 12.0–15.0)
MCH: 33 pg (ref 26.0–34.0)
MCHC: 31.7 g/dL (ref 30.0–36.0)
MCV: 104.1 fL — ABNORMAL HIGH (ref 80.0–100.0)
Platelets: 178 10*3/uL (ref 150–400)
RBC: 2.21 MIL/uL — ABNORMAL LOW (ref 3.87–5.11)
RDW: 17.1 % — ABNORMAL HIGH (ref 11.5–15.5)
WBC: 3.2 10*3/uL — ABNORMAL LOW (ref 4.0–10.5)
nRBC: 0 % (ref 0.0–0.2)

## 2024-04-16 LAB — ECHOCARDIOGRAM COMPLETE
AR max vel: 1.87 cm2
AV Area VTI: 2 cm2
AV Area mean vel: 2.01 cm2
AV Mean grad: 9 mmHg
AV Peak grad: 16.1 mmHg
Ao pk vel: 2 m/s
Area-P 1/2: 2.11 cm2
Height: 64 in
MV VTI: 1.78 cm2
S' Lateral: 2.8 cm
Weight: 2320 [oz_av]

## 2024-04-16 LAB — VITAMIN B12: Vitamin B-12: 1261 pg/mL — ABNORMAL HIGH (ref 180–914)

## 2024-04-16 LAB — HEMOGLOBIN A1C
Hgb A1c MFr Bld: 3.7 % — ABNORMAL LOW (ref 4.8–5.6)
Mean Plasma Glucose: 59.49 mg/dL

## 2024-04-16 MED ORDER — SODIUM CHLORIDE 0.9 % IV BOLUS
500.0000 mL | Freq: Once | INTRAVENOUS | Status: AC
  Administered 2024-04-16: 500 mL via INTRAVENOUS

## 2024-04-16 MED ORDER — CLOPIDOGREL BISULFATE 75 MG PO TABS: 75.0000 mg | ORAL_TABLET | Freq: Every day | ORAL | 0 refills | Status: AC

## 2024-04-16 MED ORDER — ALBUMIN HUMAN 25 % IV SOLN
25.0000 g | Freq: Four times a day (QID) | INTRAVENOUS | Status: DC
  Administered 2024-04-16: 25 g via INTRAVENOUS
  Filled 2024-04-16: qty 100

## 2024-04-16 MED ORDER — CLOPIDOGREL BISULFATE 75 MG PO TABS
75.0000 mg | ORAL_TABLET | Freq: Every day | ORAL | Status: DC
  Administered 2024-04-16: 75 mg via ORAL
  Filled 2024-04-16: qty 1

## 2024-04-16 NOTE — Plan of Care (Signed)
 Problem: Education: Goal: Knowledge of disease or condition will improve 04/16/2024 0628 by Chrys Cranker, RN Outcome: Not Progressing 04/16/2024 0450 by Chrys Cranker, RN Outcome: Not Progressing Goal: Knowledge of secondary prevention will improve (MUST DOCUMENT ALL) 04/16/2024 0628 by Chrys Cranker, RN Outcome: Not Progressing 04/16/2024 0450 by Chrys Cranker, RN Outcome: Not Progressing Goal: Knowledge of patient specific risk factors will improve (DELETE if not current risk factor) 04/16/2024 0628 by Chrys Cranker, RN Outcome: Not Progressing 04/16/2024 0450 by Chrys Cranker, RN Outcome: Not Progressing   Problem: Ischemic Stroke/TIA Tissue Perfusion: Goal: Complications of ischemic stroke/TIA will be minimized 04/16/2024 0628 by Chrys Cranker, RN Outcome: Not Progressing 04/16/2024 0450 by Chrys Cranker, RN Outcome: Not Progressing   Problem: Coping: Goal: Will verbalize positive feelings about self 04/16/2024 0628 by Chrys Cranker, RN Outcome: Not Progressing 04/16/2024 0450 by Chrys Cranker, RN Outcome: Not Progressing Goal: Will identify appropriate support needs 04/16/2024 0628 by Chrys Cranker, RN Outcome: Not Progressing 04/16/2024 0450 by Chrys Cranker, RN Outcome: Not Progressing   Problem: Health Behavior/Discharge Planning: Goal: Ability to manage health-related needs will improve 04/16/2024 0628 by Chrys Cranker, RN Outcome: Not Progressing 04/16/2024 0450 by Chrys Cranker, RN Outcome: Not Progressing Goal: Goals will be collaboratively established with patient/family 04/16/2024 [phone] by Chrys Cranker, RN Outcome: Not Progressing 04/16/2024 0450 by Chrys Cranker, RN Outcome: Not Progressing   Problem: Self-Care: Goal: Ability to participate in self-care as condition permits will improve 04/16/2024 0628 by Chrys Cranker, RN Outcome: Not Progressing 04/16/2024 0450 by Chrys Cranker, RN Outcome: Not Progressing Goal: Verbalization of feelings and concerns over difficulty with self-care will improve 04/16/2024 0628 by Chrys Cranker, RN Outcome: Not Progressing 04/16/2024 0450 by Chrys Cranker, RN Outcome: Not Progressing Goal: Ability to communicate needs accurately will improve 04/16/2024 0628 by Chrys Cranker, RN Outcome: Not Progressing 04/16/2024 0450 by Chrys Cranker, RN Outcome: Not Progressing   Problem: Nutrition: Goal: Risk of aspiration will decrease 04/16/2024 0628 by Chrys Cranker, RN Outcome: Not Progressing 04/16/2024 0450 by Chrys Cranker, RN Outcome: Not Progressing Goal: Dietary intake will improve 04/16/2024 0628 by Chrys Cranker, RN Outcome: Not Progressing 04/16/2024 0450 by Chrys Cranker, RN Outcome: Not Progressing   Problem: Education: Goal: Knowledge of General Education information will improve Description: Including pain rating scale, medication(s)/side effects and non-pharmacologic comfort measures 04/16/2024 0628 by Chrys Cranker, RN Outcome: Not Progressing 04/16/2024 0450 by Chrys Cranker, RN Outcome: Not Progressing   Problem: Health Behavior/Discharge Planning: Goal: Ability to manage health-related needs will improve 04/16/2024 0628 by Chrys Cranker, RN Outcome: Not Progressing 04/16/2024 0450 by Chrys Cranker, RN Outcome: Not Progressing   Problem: Clinical Measurements: Goal: Ability to maintain clinical measurements within normal limits will improve 04/16/2024 0628 by Chrys Cranker, RN Outcome: Not Progressing 04/16/2024 0450 by Chrys Cranker, RN Outcome: Not Progressing Goal: Will remain free from infection 04/16/2024 0628 by Chrys Cranker, RN Outcome: Not Progressing 04/16/2024 0450 by Chrys Cranker, RN Outcome: Not Progressing Goal: Diagnostic test results will improve 04/16/2024 0628 by Chrys Cranker, RN Outcome: Not  Progressing 04/16/2024 0450 by Chrys Cranker, RN Outcome: Not Progressing Goal: Respiratory complications will improve 04/16/2024 0628 by Chrys Cranker, RN Outcome: Not Progressing 04/16/2024 0450 by Chrys Cranker, RN Outcome: Not Progressing Goal: Cardiovascular complication will be avoided 04/16/2024 0628 by  Delta Fila D, RN Outcome: Not Progressing 04/16/2024 0450 by Chrys Cranker, RN Outcome: Not Progressing   Problem: Activity: Goal: Risk for activity intolerance will decrease 04/16/2024 0628 by Chrys Cranker, RN Outcome: Not Progressing 04/16/2024 0450 by Chrys Cranker, RN Outcome: Not Progressing   Problem: Nutrition: Goal: Adequate nutrition will be maintained 04/16/2024 0628 by Chrys Cranker, RN Outcome: Not Progressing 04/16/2024 0450 by Chrys Cranker, RN Outcome: Not Progressing   Problem: Coping: Goal: Level of anxiety will decrease 04/16/2024 0628 by Chrys Cranker, RN Outcome: Not Progressing 04/16/2024 0450 by Chrys Cranker, RN Outcome: Not Progressing   Problem: Elimination: Goal: Will not experience complications related to bowel motility 04/16/2024 0628 by Chrys Cranker, RN Outcome: Not Progressing 04/16/2024 0450 by Chrys Cranker, RN Outcome: Not Progressing Goal: Will not experience complications related to urinary retention 04/16/2024 0628 by Chrys Cranker, RN Outcome: Not Progressing 04/16/2024 0450 by Chrys Cranker, RN Outcome: Not Progressing   Problem: Pain Managment: Goal: General experience of comfort will improve and/or be controlled 04/16/2024 0628 by Chrys Cranker, RN Outcome: Not Progressing 04/16/2024 0450 by Chrys Cranker, RN Outcome: Not Progressing   Problem: Safety: Goal: Ability to remain free from injury will improve 04/16/2024 0628 by Chrys Cranker, RN Outcome: Not Progressing 04/16/2024 0450 by Chrys Cranker, RN Outcome: Not Progressing    Problem: Skin Integrity: Goal: Risk for impaired skin integrity will decrease 04/16/2024 0628 by Chrys Cranker, RN Outcome: Not Progressing 04/16/2024 0450 by Chrys Cranker, RN Outcome: Not Progressing

## 2024-04-16 NOTE — Plan of Care (Signed)
  Problem: Education: Goal: Knowledge of disease or condition will improve Outcome: Progressing   Problem: Education: Goal: Knowledge of secondary prevention will improve (MUST DOCUMENT ALL) Outcome: Progressing   Problem: Education: Goal: Knowledge of patient specific risk factors will improve (DELETE if not current risk factor) Outcome: Progressing   Problem: Ischemic Stroke/TIA Tissue Perfusion: Goal: Complications of ischemic stroke/TIA will be minimized Outcome: Progressing   Problem: Coping: Goal: Will verbalize positive feelings about self Outcome: Progressing   Problem: Self-Care: Goal: Ability to participate in self-care as condition permits will improve Outcome: Progressing   Problem: Self-Care: Goal: Verbalization of feelings and concerns over difficulty with self-care will improve Outcome: Progressing   Problem: Self-Care: Goal: Ability to communicate needs accurately will improve Outcome: Progressing   Problem: Nutrition: Goal: Risk of aspiration will decrease Outcome: Progressing   Problem: Nutrition: Goal: Dietary intake will improve Outcome: Progressing

## 2024-04-16 NOTE — Progress Notes (Signed)
 30-day event monitor ordered for recurrent CVA per hospitalist request, also requesting referral for potential PFO closure.  Dr. Annabelle Barrack to see after 30-day event monitor on 05/18/2024, can make referral for PFO closure if appropriate.

## 2024-04-16 NOTE — Discharge Summary (Signed)
 Physician Discharge Summary   [name] ZOX:[id number] DOB: 1951-05-16 DOA: 04/15/2024  PCP: Dorothe Gaster, NP  Admit date: 04/15/2024 Discharge date: 04/16/2024  Admitted From: Home Disposition:  Home Discharging physician: Faith Homes, MD Barriers to discharge: none  Recommendations at discharge: Follow up with cardiology for PFO closure consult Zio patch ordered to be arranged on discharge If remains hypotensive, may need to be resumed back on midodrine  Monitor for tolerance on Plavix   Discharge Condition: stable CODE STATUS: Full  Diet recommendation:  Diet Orders (From admission, onward)     Start     Ordered   04/16/24 0000  Diet - low sodium heart healthy        04/16/24 1504   04/15/24 1851  Diet Heart Room service appropriate? Yes; Fluid consistency: Thin  Diet effective now       Question Answer Comment  Room service appropriate? 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 Katherine has also had complication from cirrhosis including portal hypertension, esophageal varices, and hepatic encephalopathy.  Katherine underwent TIPS in 2012 as well with no issues with encephalopathy until around 2021 after having covid.   Katherine was hospitalized 12/21/2023 until 12/25/2023 with encephalopathy at that time but also found to have multiple bilateral acute infarcts on MRI brain.  Echo with bubble study at that time showed atrial level shunting consistent with PFO. Katherine followed up with cardiology outpatient and underwent 30-day event monitor which was negative.  Katherine has remained compliant on aspirin . Katherine then followed up with neurology on 03/16/2024.  After her stroke in January it was  felt that the chance of stroke recurrence was low and therefore PFO closure was not felt to be warranted.  On this hospitalization Katherine presented mostly with a headache as the concerning symptom.  Per her husband, her mentation was about the same in setting of her underlying cirrhosis and occasional encephalopathy and Katherine seemed to have no significant motor or sensory deficits.  MRI brain was again obtained and showed multiple bilateral acute infarcts involving posterior circulation, left greater than right and anterior circulation on the left. MRA head and neck were negative for significant carotid stenosis. Echo was performed but without bubble study and unchanged. Lower extremity duplex negative for DVT.  Katherine will be referred back to cardiology for discussion of PFO closure along with a Zio patch monitoring. Katherine will continue on aspirin  and Plavix for 21 days followed by aspirin  monotherapy at discharge.   The Katherine's acute and chronic medical conditions were treated accordingly. On day of discharge, Katherine was felt deemed stable for discharge. Katherine/family member advised to call PCP or come back to ER if needed.   Principal Diagnosis: Acute CVA (cerebrovascular accident) [facility])  Discharge Diagnoses: Active Hospital Problems   Diagnosis Date Noted   Acute CVA (cerebrovascular accident) (HCC) 04/15/2024    Priority: 1.   Patent foramen ovale 04/16/2024    Priority: 2.   Decompensated liver cirrhosis with portal HTN and gastric varices 12/04/2020    Priority: 3.   Hypothyroidism 06/30/2019    Priority: 6.   Tachycardia-bradycardia syndrome (HCC) 02/05/2022    Priority: 8.  History of stroke 01/06/2024   Thrombocytopenia (HCC) 12/21/2023   Anemia 05/18/2022   Hypertension 05/06/2021   Hx of colon cancer, stage III 11/30/2011   Portal hypertension (HCC) 01/23/2008    Resolved Hospital Problems  No resolved problems to display.     Discharge Instructions     Ambulatory  referral to Physical Therapy   Complete by: As directed    Diet - low sodium heart healthy   Complete by: As directed    Increase activity slowly   Complete by: As directed       Allergies as of 04/16/2024       Reactions   Contrast Media [iodinated Contrast Media] Hives        Medication List     TAKE these medications    Accu-Chek Softclix Lancets lancets 3 (three) times daily.   aspirin  EC 81 MG tablet Take 1 tablet (81 mg total) by mouth daily. Swallow whole.   Blood Glucose Monitoring Suppl Devi 1 each by Does not apply route in the morning, at noon, and at bedtime. May substitute to any manufacturer covered by Katherine's insurance.   BLOOD GLUCOSE TEST STRIPS Strp 1 each by In Vitro route in the morning, at noon, and at bedtime. May substitute to any manufacturer covered by Katherine's insurance.   clopidogrel 75 MG tablet Commonly known as: PLAVIX Take 1 tablet (75 mg total) by mouth daily for 21 days.   CRANBERRY PO Take 1 tablet by mouth 2 (two) times daily.   furosemide  20 MG tablet Commonly known as: LASIX  Take 20-40 mg by mouth See admin instructions. Take blood pressure and depending on reading take 20mg  or 40mg  by mouth daily.   lactulose  10 GM/15ML solution Commonly known as: CHRONULAC  Take 45 mLs (30 g total) by mouth 3 (three) times daily.   Lancet Device Misc 1 each by Does not apply route in the morning, at noon, and at bedtime. May substitute to any manufacturer covered by Katherine's insurance.   Lancets Misc. Misc 1 each by Does not apply route in the morning, at noon, and at bedtime. May substitute to any manufacturer covered by Katherine's insurance.   levOCARNitine  330 MG tablet Commonly known as: CARNITOR  Take 330 mg by mouth 3 (three) times daily.   levothyroxine  25 MCG tablet Commonly known as: SYNTHROID  Take 25 mcg by mouth daily before breakfast.   liver oil-zinc  oxide 40 % ointment Commonly known as: DESITIN Apply topically daily  as needed for irritation.   magnesium  oxide 400 MG tablet Commonly known as: MAG-OX Take 1 tablet (400 mg total) by mouth daily.   Multi-Vitamin tablet Take 1 tablet by mouth daily.   polyethylene glycol 17 g packet Commonly known as: MIRALAX  / GLYCOLAX  Take 17 g by mouth daily as needed for mild constipation.   rifaximin  550 MG Tabs tablet Commonly known as: XIFAXAN  Take 1 tablet (550 mg total) by mouth 2 (two) times daily. What changed:  when to take this reasons to take this additional instructions   spironolactone  25 MG tablet Commonly known as: ALDACTONE  Take 50 mg by mouth daily.        Allergies  Allergen Reactions   Contrast Media [Iodinated Contrast Media] Hives    Consultations: Neurology  Procedures:   Discharge Exam: BP (!) 88/45 (BP Location: Right Arm)   Pulse 61   Temp (!) 97.5 F (36.4 C) (Oral)   Resp 16   Ht 5\' 4"  (1.626 m)   Wt 65.8 kg  SpO2 98%   BMI 24.89 kg/m  Physical Exam Constitutional:      Appearance: Normal appearance.  HENT:     Head: Normocephalic and atraumatic.     Mouth/Throat:     Mouth: Mucous membranes are moist.  Eyes:     Extraocular Movements: Extraocular movements intact.  Cardiovascular:     Rate and Rhythm: Normal rate and regular rhythm.  Pulmonary:     Effort: Pulmonary effort is normal. No respiratory distress.     Breath sounds: Normal breath sounds. No wheezing.  Abdominal:     General: Bowel sounds are normal. There is no distension.     Palpations: Abdomen is soft.     Tenderness: There is no abdominal tenderness.  Musculoskeletal:        General: Normal range of motion.     Cervical back: Normal range of motion and neck supple.  Skin:    General: Skin is warm and dry.     Findings: Bruising (scattered) present.  Neurological:     General: No focal deficit present.     Mental Status: [name].  Psychiatric:        Mood and Affect: Mood normal.      The results of significant  diagnostics from this hospitalization (including imaging, microbiology, ancillary and laboratory) are listed below for reference.   Microbiology: No results found for this or any previous visit (from the past 240 hours).   Labs: BNP (last 3 results) No results for input(s): "BNP" in the last 8760 hours. Basic Metabolic Panel: Recent Labs  Lab 04/10/24 1220 04/15/24 1154 04/15/24 1240 04/16/24 0656  NA 134* 137 134* 137  K 4.8 4.8 5.6* 5.0  CL 97 102 102 109  CO2 33*  --  24 22  GLUCOSE 99 117* 115* 69*  BUN 19 17 16 16   CREATININE 1.27* 1.10* 0.99 0.97  CALCIUM  9.2  --  9.0 8.1*  MG 1.9  --   --   --    Liver Function Tests: Recent Labs  Lab 04/15/24 1240 04/16/24 0656  AST 92* 53*  ALT 37 32  ALKPHOS 147* 105  BILITOT 1.0 1.0  PROT 5.9* 4.9*  ALBUMIN  2.4* 1.9*   No results for input(s): "LIPASE", "AMYLASE" in the last 168 hours. No results for input(s): "AMMONIA" in the last 168 hours. CBC: Recent Labs  Lab 04/10/24 1220 04/15/24 1154 04/15/24 1240 04/16/24 0656  WBC 3.7*  --  2.4* 3.2*  NEUTROABS  --   --  1.3*  --   HGB 9.3* 9.5* 8.6* 7.3*  HCT 27.5* 28.0* 27.2* 23.0*  MCV 100.8*  --  104.6* 104.1*  PLT 280.0  --  202 178   Cardiac Enzymes: No results for input(s): "CKTOTAL", "CKMB", "CKMBINDEX", "TROPONINI" in the last 168 hours. BNP: Invalid input(s): "POCBNP" CBG: Recent Labs  Lab 04/15/24 1126  GLUCAP 125*   D-Dimer No results for input(s): "DDIMER" in the last 72 hours. Hgb A1c Recent Labs    04/16/24 0656  HGBA1C 3.7*   Lipid Profile Recent Labs    04/16/24 0656  CHOL 117  HDL 43  LDLCALC 69  TRIG 26  CHOLHDL 2.7   Thyroid  function studies No results for input(s): "TSH", "T4TOTAL", "T3FREE", "THYROIDAB" in the last 72 hours.  Invalid input(s): "FREET3" Anemia work up Recent Labs    04/15/24 1447 04/15/24 2328  VITAMINB12  --  1,261*  FOLATE 36.1  --    Urinalysis    Component  Value Date/Time   COLORURINE YELLOW  03/31/2024 1517   APPEARANCEUR CLEAR 03/31/2024 1517   LABSPEC 1.010 03/31/2024 1517   PHURINE 9.0 (H) 03/31/2024 1517   GLUCOSEU NEGATIVE 03/31/2024 1517   GLUCOSEU NEGATIVE 08/04/2023 0916   HGBUR NEGATIVE 03/31/2024 1517   BILIRUBINUR NEGATIVE 03/31/2024 1517   BILIRUBINUR negative 12/06/2023 1117   BILIRUBINUR Negative 02/04/2023 0917   KETONESUR NEGATIVE 03/31/2024 1517   PROTEINUR NEGATIVE 03/31/2024 1517   UROBILINOGEN 0.2 12/06/2023 1117   UROBILINOGEN 0.2 08/04/2023 0916   NITRITE NEGATIVE 03/31/2024 1517   LEUKOCYTESUR NEGATIVE 03/31/2024 1517   Sepsis Labs Recent Labs  Lab 04/10/24 1220 04/15/24 1240 04/16/24 0656  WBC 3.7* 2.4* 3.2*   Microbiology No results found for this or any previous visit (from the past 240 hours).  Procedures/Studies: VAS US  LOWER EXTREMITY VENOUS (DVT) Result Date: 04/16/2024  Lower Venous DVT Study Katherine Name:  DESHANTA Park  Date of Exam:   04/16/2024 Medical Rec #: [id number]        Accession #:    [id number] Date of Birth: 08/10/1951         Katherine Gender: F Katherine Age:   32 years Exam Location:  [facility] Procedure:      VAS US  LOWER EXTREMITY VENOUS (DVT) Referring Phys: Authur Leghorn MELVIN --------------------------------------------------------------------------------  Indications: Stroke.  Comparison Study: 12/22/2023 Negative for DVT Performing Technologist: Gelene Kelly RDCS  Examination Guidelines: A complete evaluation includes B-mode imaging, spectral Doppler, color Doppler, and power Doppler as needed of all accessible portions of each vessel. Bilateral testing is considered an integral part of a complete examination. Limited examinations for reoccurring indications may be performed as noted. The reflux portion of the exam is performed with the Katherine in reverse Trendelenburg.  +---------+---------------+---------+-----------+----------+--------------+ RIGHT    CompressibilityPhasicitySpontaneityPropertiesThrombus  Aging +---------+---------------+---------+-----------+----------+--------------+ CFV      Full           Yes      Yes                                 +---------+---------------+---------+-----------+----------+--------------+ SFJ      Full           Yes      Yes                                 +---------+---------------+---------+-----------+----------+--------------+ FV Prox  Full           Yes      Yes                                 +---------+---------------+---------+-----------+----------+--------------+ FV Mid   Full           Yes      Yes                                 +---------+---------------+---------+-----------+----------+--------------+ FV DistalFull           Yes      Yes                                 +---------+---------------+---------+-----------+----------+--------------+ PFV      Full                                                        +---------+---------------+---------+-----------+----------+--------------+  POP      Full           Yes      Yes                                 +---------+---------------+---------+-----------+----------+--------------+ PTV      Full           Yes      Yes                                 +---------+---------------+---------+-----------+----------+--------------+ PERO     Full           Yes      Yes                                 +---------+---------------+---------+-----------+----------+--------------+ Gastroc  Full                                                        +---------+---------------+---------+-----------+----------+--------------+ GSV      Full           Yes      Yes                                 +---------+---------------+---------+-----------+----------+--------------+   +---------+---------------+---------+-----------+----------+--------------+ LEFT     CompressibilityPhasicitySpontaneityPropertiesThrombus Aging  +---------+---------------+---------+-----------+----------+--------------+ CFV      Full           Yes      Yes                                 +---------+---------------+---------+-----------+----------+--------------+ SFJ      Full           Yes      Yes                                 +---------+---------------+---------+-----------+----------+--------------+ FV Prox  Full           Yes      Yes                                 +---------+---------------+---------+-----------+----------+--------------+ FV Mid   Full           Yes      Yes                                 +---------+---------------+---------+-----------+----------+--------------+ FV DistalFull           Yes      Yes                                 +---------+---------------+---------+-----------+----------+--------------+ PFV      Full                                                        +---------+---------------+---------+-----------+----------+--------------+  POP      Full           Yes      Yes                                 +---------+---------------+---------+-----------+----------+--------------+ PTV      Full           Yes      Yes                                 +---------+---------------+---------+-----------+----------+--------------+ PERO     Full           Yes      Yes                                 +---------+---------------+---------+-----------+----------+--------------+ Gastroc  Full                                                        +---------+---------------+---------+-----------+----------+--------------+ GSV      Full           Yes      Yes                                 +---------+---------------+---------+-----------+----------+--------------+    Summary: BILATERAL: - No evidence of deep vein thrombosis seen in the lower extremities, bilaterally. -No evidence of popliteal cyst, bilaterally.   *See table(s) above for measurements and  observations.    Preliminary    ECHOCARDIOGRAM COMPLETE Result Date: 04/16/2024    ECHOCARDIOGRAM REPORT   Katherine Name:   [name] Date of Exam: 04/16/2024 Medical Rec #:  [id number]       Height:       64.0 in Accession #:    [id number]      Weight:       145.0 lb Date of Birth:  [birth year]        BSA:          1.706 m Katherine Age:    72 years        BP:           91/49 mmHg Katherine Gender: F               HR:           74 bpm. Exam Location:  Inpatient Procedure: 2D Echo, Color Doppler and Cardiac Doppler (Both Spectral and Color            Flow Doppler were utilized during procedure). Indications:    Stroke i63.9  History:        Katherine has prior history of Echocardiogram examinations, most                 recent 12/23/2023. Portal Hypertension; Risk                 Factors:Hypertension. S/p TIPS procedure.  Sonographer:    Sherline Distel Senior RDCS Referring Phys: Leandro Proffer [place] IMPRESSIONS  1. Left ventricular ejection fraction, by estimation, is 70 to 75%. The left ventricle has  hyperdynamic function. The left ventricle has no regional wall motion abnormalities. Left ventricular diastolic parameters are indeterminate.  2. Right ventricular systolic function is normal. The right ventricular size is mildly enlarged. There is moderately elevated pulmonary artery systolic pressure. The estimated right ventricular systolic pressure is 45.7 mmHg.  3. Left atrial size was mildly dilated.  4. The mitral valve is degenerative. Mild mitral valve regurgitation. The mean mitral valve gradient is 8.5 mmHg with average heart rate of 77 bpm, increased flow velocities secondary to high flow state, challenging to interpret degree of stenosis. Severe mitral annular calcification.  5. The aortic valve is tricuspid. Aortic valve regurgitation is not visualized. No aortic stenosis is present.  6. The inferior vena cava is dilated in size with <50% respiratory variability, suggesting right atrial pressure of 15 mmHg.  7.  Increased flow velocities may be secondary to anemia, thyrotoxicosis, hyperdynamic or high flow state - suspect high flow from TIPS shunt physiology. FINDINGS  Left Ventricle: Left ventricular ejection fraction, by estimation, is 70 to 75%. The left ventricle has hyperdynamic function. The left ventricle has no regional wall motion abnormalities. The left ventricular internal cavity size was normal in size. There is no left ventricular hypertrophy. Left ventricular diastolic parameters are indeterminate. Right Ventricle: The right ventricular size is mildly enlarged. No increase in right ventricular wall thickness. Right ventricular systolic function is normal. There is moderately elevated pulmonary artery systolic pressure. The tricuspid regurgitant velocity is 2.77 m/s, and with an assumed right atrial pressure of 15 mmHg, the estimated right ventricular systolic pressure is 45.7 mmHg. Left Atrium: Left atrial size was mildly dilated. Right Atrium: Right atrial size was normal in size. Pericardium: There is no evidence of pericardial effusion. Mitral Valve: The mitral valve is degenerative in appearance. Severe mitral annular calcification. Mild mitral valve regurgitation. MV peak gradient, 16.2 mmHg. The mean mitral valve gradient is 8.5 mmHg with average heart rate of 77 bpm. Tricuspid Valve: The tricuspid valve is normal in structure. Tricuspid valve regurgitation is mild. Aortic Valve: The aortic valve is tricuspid. Aortic valve regurgitation is not visualized. No aortic stenosis is present. Aortic valve mean gradient measures 9.0 mmHg. Aortic valve peak gradient measures 16.1 mmHg. Aortic valve area, by VTI measures 2.00  cm. Pulmonic Valve: The pulmonic valve was normal in structure. Pulmonic valve regurgitation is mild. Aorta: The aortic root and ascending aorta are structurally normal, with no evidence of dilitation. Venous: The inferior vena cava is dilated in size with less than 50% respiratory  variability, suggesting right atrial pressure of 15 mmHg. IAS/Shunts: The atrial septum is grossly normal.  LEFT VENTRICLE PLAX 2D LVIDd:         5.10 cm   Diastology LVIDs:         2.80 cm   LV e' medial:    7.29 cm/s LV PW:         0.80 cm   LV E/e' medial:  17.8 LV IVS:        0.70 cm   LV e' lateral:   8.49 cm/s LVOT diam:     1.80 cm   LV E/e' lateral: 15.3 LV SV:         87 LV SV Index:   51 LVOT Area:     2.54 cm  RIGHT VENTRICLE RV S prime:     12.30 cm/s TAPSE (M-mode): 2.3 cm LEFT ATRIUM             Index  RIGHT ATRIUM           Index LA diam:        3.10 cm 1.82 cm/m   RA Area:     14.80 cm LA Vol (A2C):   53.0 ml 31.06 ml/m  RA Volume:   30.40 ml  17.81 ml/m LA Vol (A4C):   55.0 ml 32.23 ml/m LA Biplane Vol: 53.8 ml 31.53 ml/m  AORTIC VALVE AV Area (Vmax):    1.87 cm AV Area (Vmean):   2.01 cm AV Area (VTI):     2.00 cm AV Vmax:           200.33 cm/s AV Vmean:          143.000 cm/s AV VTI:            0.436 m AV Peak Grad:      16.1 mmHg AV Mean Grad:      9.0 mmHg LVOT Vmax:         147.50 cm/s LVOT Vmean:        113.000 cm/s LVOT VTI:          0.342 m LVOT/AV VTI ratio: 0.79  AORTA Ao Root diam: 2.70 cm Ao Asc diam:  2.90 cm MITRAL VALVE                TRICUSPID VALVE MV Area (PHT): 2.11 cm     TR Peak grad:   30.7 mmHg MV Area VTI:   1.78 cm     TR Vmax:        277.00 cm/s MV Peak grad:  16.2 mmHg MV Mean grad:  8.5 mmHg     SHUNTS MV Vmax:       2.01 m/s     Systemic VTI:  0.34 m MV Vmean:      138.5 cm/s   Systemic Diam: 1.80 cm MV Decel Time: 360 msec MV E velocity: 130.00 cm/s MV A velocity: 166.00 cm/s MV E/A ratio:  0.78 Grady Lawman MD Electronically signed by Grady Lawman MD Signature Date/Time: 04/16/2024/9:50:23 AM    Final    MR BRAIN W WO CONTRAST Result Date: 04/15/2024 EXAM: MRI BRAIN WITH AND WITHOUT CONTRAST 04/15/2024 12:57:31 PM TECHNIQUE: Multiplanar multisequence MRI of the head/brain was performed with and without the administration of intravenous  contrast. COMPARISON: CT head without contrast 04/15/2024. MRI head without contrast 12/22/2023. CLINICAL HISTORY: Neuro deficit, acute, stroke suspected. FINDINGS: BRAIN AND VENTRICLES: Bilateral acute posterior cerebellar infarcts are present, left greater than right. A subcortical acute infarct in the left occipital lobe measures 8 mm. A medial right occipital lobe cortical infarct is present on image 70 of series 5. Punctate acute infarcts are present in the thalami bilaterally. 10 mm subcortical white matter infarct is present in the anterior left frontal lobe. T2 and FLAIR hyperintensities are associated with the areas of acute infarction. Scattered subcortical T2 hyperintensities are moderately advanced for age, separate from the areas of acute infarction. No acute intracranial hemorrhage. No mass effect or midline shift. No hydrocephalus. The sella is unremarkable. Normal flow voids. No mass or abnormal enhancement. ORBITS: No acute abnormality. SINUSES: No acute abnormality. BONES AND SOFT TISSUES: Normal bone marrow signal and enhancement. No acute soft tissue abnormality. IMPRESSION: 1. Multiple bilateral acute non-hemorrhagic infarcts involving the posterior circulation, left greater than right, and the anterior circulation on the left. 2. Scattered subcortical T2 hyperintensities are moderately advanced for age. This likely reflects the sequelae of chronic microvascular ischemia. Electronically signed by: Veryl Gottron  Mattern MD 04/15/2024 01:53 PM EDT RP Workstation: [id number]   MR ANGIO HEAD WO CONTRAST Result Date: 04/15/2024 EXAM: MR Angiography Head without intravenous Contrast. 04/15/2024 12:57:31 PM TECHNIQUE: Magnetic resonance angiography images of the head without intravenous contrast. Three-dimensional MIP reformations performed. COMPARISON: CT head without contrast 04/15/2024. MR angio head without contrast 12/24/2023. CLINICAL HISTORY: Neuro deficit, acute, stroke suspected. FINDINGS:  ANTERIOR CIRCULATION: No significant stenosis of the internal carotid arteries. No significant stenosis of the anterior cerebral arteries. No significant stenosis of the middle cerebral arteries. No aneurysm. POSTERIOR CIRCULATION: No significant stenosis of the posterior cerebral arteries. No significant stenosis of the basilar artery. No significant stenosis of the vertebral arteries. No aneurysm. IMPRESSION: 1. No significant stenosis of the intracranial vasculature. Electronically signed by: Audree Leas MD 04/15/2024 01:40 PM EDT RP Workstation: [id number]   MR ANGIO NECK W WO CONTRAST Result Date: 04/15/2024 EXAM: MRA Neck without and with contrast 04/15/2024 12:57:31 PM TECHNIQUE: Multiplanar multisequence MRA of the neck was performed without and with the administration of 6 mL intravenous gadobutrol (GADAVIST) 1 MMOL/ML injection. COMPARISON: CT head without contrast 04/15/2024 CLINICAL HISTORY: Carotid artery stenosis screening, risk factors. FINDINGS: CAROTID ARTERIES: No dissection, arterial injury, or hemodynamically significant stenosis by NASCET criteria. VERTEBRAL ARTERIES: No dissection, arterial injury, or significant stenosis. IMPRESSION: 1. No evidence of carotid or vertebral artery dissection, arterial injury, or hemodynamically significant stenosis. Electronically signed by: Audree Leas MD 04/15/2024 01:35 PM EDT RP Workstation: [id number]   CT HEAD CODE STROKE WO CONTRAST Result Date: 04/15/2024 CLINICAL DATA:  Code stroke.  Vision change and sudden headache. EXAM: CT HEAD WITHOUT CONTRAST TECHNIQUE: Contiguous axial images were obtained from the base of the skull through the vertex without intravenous contrast. RADIATION DOSE REDUCTION: This exam was performed according to the departmental dose-optimization program which includes automated exposure control, adjustment of the mA and/or kV according to Katherine size and/or use of iterative reconstruction technique.  COMPARISON:  Brain MRI 12/22/2023 FINDINGS: Brain: No evidence of acute infarction, hemorrhage, hydrocephalus, extra-axial collection or mass lesion/mass effect. Mild patchy low-density in the cerebral white matter attributed, under appreciated compared to prior brain MRI. Brain volume is normal Vascular: No hyperdense vessel or unexpected calcification. Skull: Normal. Negative for fracture or focal lesion. Sinuses/Orbits: No acute finding.  Bilateral cataract resection Other: Prelim sent in epic chat ASPECTS [facility] Stroke [facility] Early CT Score) Not scored with this history IMPRESSION: 1. No acute finding. 2. Chronic white matter disease known from brain MRI earlier this year. Electronically Signed   By: Ronnette Coke M.D.   On: 04/15/2024 12:01   DG Abd Portable 1V Result Date: 04/03/2024 CLINICAL DATA:  Constipation, nausea and vomiting. EXAM: PORTABLE ABDOMEN - 1 VIEW COMPARISON:  None Available. FINDINGS: Gas is seen in nondistended small bowel and colon. Transjugular intrahepatic portosystemic shunt in the right upper quadrant. Probable linear scarring in the costophrenic angle. IMPRESSION: No acute findings. Electronically Signed   By: Shearon Denis M.D.   On: 04/03/2024 19:38   US  ABDOMEN LIMITED WITH LIVER DOPPLER Result Date: 03/31/2024 CLINICAL DATA:  History of cirrhosis status post TIPS EXAM: DUPLEX ULTRASOUND OF LIVER TECHNIQUE: Color and duplex Doppler ultrasound was performed to evaluate the hepatic in-flow and out-flow vessels. COMPARISON:  Hepatic Doppler ultrasound examination dated 12/22/2023 FINDINGS: Liver: Normal parenchymal echogenicity. Normal hepatic contour without nodularity. No focal lesion, mass or intrahepatic biliary ductal dilatation. Incidentally noted cholelithiasis. Gallstone measures 8 mm. No gallbladder wall thickening. Common bile duct measures 4 mm. Portal Vein  Velocities: Main: 70 cm/sec, previously 16 centimeters/second Right: 71 cm/sec, previously 53  centimeters/second Left: 41 cm/sec, previously in 12 centimeters/second Transjugular intrahepatic portosystemic shunt velocities: Prox:  74 cm/sec Mid: 78 cm/sec Dist:  96 cm/sec, previously 49 cm/second Hepatic Vein Velocities Right:  55 cm/sec, previously 20 cm/second Middle:  61 cm/sec, previously 19 centimeters/second Left: Not measured. Hepatic Artery Velocity:  128 cm/sec Splenic Vein Velocity:  17 cm/sec Ascites: None Varices: Previously documented. IMPRESSION: 1. Patent TIPS. Globally elevated venous velocities when compared to prior examination may be related to technique. 2. Incidentally noted cholelithiasis. Electronically Signed   By: Limin  Xu M.D.   On: 03/31/2024 19:09   DG CHEST PORT 1 VIEW Result Date: 03/31/2024 CLINICAL DATA:  Pneumonia. EXAM: PORTABLE CHEST 1 VIEW COMPARISON:  12/21/2023 FINDINGS: Lung volumes are low. The heart is upper normal in size but stable. No confluent airspace disease. Bronchovascular crowding related to low lung volumes. No pneumothorax or large pleural effusion. TIPS in the right upper quadrant of the abdomen. IMPRESSION: Low lung volumes with bronchovascular crowding. No confluent airspace disease. Electronically Signed   By: Chadwick Colonel M.D.   On: 03/31/2024 17:17     Time coordinating discharge: Over 30 minutes    Faith Homes, MD  [facility] 04/16/2024, 3:04 PM

## 2024-04-16 NOTE — Plan of Care (Signed)

## 2024-04-16 NOTE — Evaluation (Signed)
 Physical Therapy Evaluation Patient Details Name: [name] MRN: [id number] DOB: 1951-03-18 Today's Date: 04/16/2024  History of Present Illness  73 y.o. female  presenting 04/15/24 with sudden-onset dizziness, headache, and leg heaviness. MRI brain multiple acute infarcts Left >Right (bil cerebellar, occipital, thalami), L frontal  PMH includes: CA of cecum, esophageal varices, HTN, tachybrady syndrome, CVA, cirrhosis, anemia  Clinical Impression   Pt admitted secondary to problem above with deficits below. PTA patient lives with spouse in one level home with ramp to enter. She normally walks with  Pt currently requires RW for safety due to mild confusion/encephalopathy (per husband) and feeling weaker than her norm. She was scheduled to start OPPT later this week and agree she could benefit from OPPT. Anticipate patient will benefit from PT to address problems listed below. Will continue to follow acutely to maximize functional mobility, independence, and safety.           If plan is discharge home, recommend the following: A little help with walking and/or transfers;A little help with bathing/dressing/bathroom;Assistance with cooking/housework;Assist for transportation;Help with stairs or ramp for entrance   Can travel by private vehicle        Equipment Recommendations None recommended by PT  Recommendations for Other Services       Functional Status Assessment Patient has had a recent decline in their functional status and demonstrates the ability to make significant improvements in function in a reasonable and predictable amount of time.     Precautions / Restrictions Precautions Precautions: Fall Restrictions Weight Bearing Restrictions Per Provider Order: No      Mobility  Bed Mobility Overal bed mobility: Needs Assistance Bed Mobility: Supine to Sit, Sit to Supine     Supine to sit: HOB elevated, Supervision Sit to supine: Modified independent (Device/Increase  time)   General bed mobility comments: increased time and effort to get to EOB and get feet on the floor    Transfers Overall transfer level: Needs assistance Equipment used: Rolling walker (2 wheels) Transfers: Sit to/from Stand Sit to Stand: Contact guard assist           General transfer comment: for safety; no cues needed; from EOB and toilet    Ambulation/Gait Ambulation/Gait assistance: Contact guard assist Gait Distance (Feet): 30 Feet Assistive device: Rolling walker (2 wheels) Gait Pattern/deviations: Step-through pattern, Decreased stride length   Gait velocity interpretation: 1.31 - 2.62 ft/sec, indicative of limited community ambulator   General Gait Details: to/from bathroom and then US  tech present to do dopplers; knew how to turn RW sideways for in/out of bathroom without cues; no cues for sequencing sit to stand to sit  Careers information officer     Tilt Bed    Modified Rankin (Stroke Patients Only)       Balance Overall balance assessment: Needs assistance Sitting-balance support: Feet supported Sitting balance-Leahy Scale: Good     Standing balance support: Bilateral upper extremity supported, During functional activity, Reliant on assistive device for balance Standing balance-Leahy Scale: Poor                 High Level Balance Comments: able to stand with feet apart and eyes closed x 10 sec without difficulty             Pertinent Vitals/Pain Pain Assessment Pain Assessment: No/denies pain    Home Living Family/patient expects to be discharged to:: Private residence Living Arrangements: Spouse/significant other Available Help  at Discharge: Family;Available 24 hours/day Type of Home: House Home Access: Ramped entrance       Home Layout: One level Home Equipment: Rolling Walker (2 wheels);BSC/3in1;Cane - single point;Wheelchair - manual;Hospital bed;Tub bench Additional Comments: walker basket. Recent  bouts of hepatic encephalopathy.    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 Using RW recently since last week at [facility] following encephalopathy; outside uses cane and holds husband's hand ADLs Comments: Mod I with bADLs, beginnig to perform some simple cooking, husband manages medications and bill pay     Extremity/Trunk Assessment   Upper Extremity Assessment Upper Extremity Assessment: Defer to OT evaluation    Lower Extremity Assessment Lower Extremity Assessment: Overall WFL for tasks assessed (strength, coordination assessed)    Cervical / Trunk Assessment Cervical / Trunk Assessment: Normal  Communication   Communication Communication: No apparent difficulties    Cognition Arousal: Alert Behavior During Therapy: WFL for tasks assessed/performed   PT - Cognitive impairments: No apparent impairments                         Following commands: Intact       Cueing Cueing Techniques: Verbal cues     General Comments General comments (skin integrity, edema, etc.): Husband present.    Exercises     Assessment/Plan    PT Assessment Patient needs continued PT services  PT Problem List Decreased activity tolerance;Decreased balance;Decreased mobility       PT Treatment Interventions DME instruction;Gait training;Functional mobility training;Therapeutic activities;Therapeutic exercise;Patient/family education;Balance training;Neuromuscular re-education    PT Goals (Current goals can be found in the Care Plan section)  Acute Rehab PT Goals Patient Stated Goal: to get better PT Goal Formulation: With patient/family Time For Goal Achievement: 04/30/24 Potential to Achieve Goals: Good    Frequency Min 3X/week     Co-evaluation               AM-PAC PT "6 Clicks" Mobility  Outcome Measure  Help needed turning from your back to your side while in a flat bed without using bedrails?: A Little Help needed moving from lying on your back to sitting on the side of a flat bed without using bedrails?: A Little Help needed moving to and from a bed to a chair (including a wheelchair)?: A Little Help needed standing up from a chair using your arms (e.g., wheelchair or bedside chair)?: A Little Help needed to walk in hospital room?: A Little Help needed climbing 3-5 steps with a railing? : A Little 6 Click Score: 18    End of Session Equipment Utilized During Treatment: Gait belt Activity Tolerance: Patient tolerated treatment well Patient left: with call bell/phone within reach;with family/visitor present;in bed Nurse Communication: Mobility status PT Visit Diagnosis: Difficulty in walking, not elsewhere classified (R26.2)    Time: 0921-0950 PT Time Calculation (min) (ACUTE ONLY): 29 min   Charges:   PT Evaluation $PT Eval Low Complexity: 1 Low PT Treatments $Gait Training: 8-22 mins PT General Charges $$ ACUTE PT VISIT: 1 Visit          Gayle Kava, PT Acute Rehabilitation Services  Office [phone]   Guilford Leep 04/16/2024, 10:15 AM

## 2024-04-16 NOTE — Progress Notes (Signed)
 Notified Dr Gladstone Lamer that BP low again 88/45. MAP of 58. She is asymptomatic and she and spouse confirmed that she is always low like this.  Dr did say that can be expected with Cirrhosis so he is not too worried at this time but to notify is MAP goes any lower and he might consider Albumin  if needed.

## 2024-04-16 NOTE — Progress Notes (Signed)
 Discharged to home after iV access removed and discharge instructions reviewed.  Took all belongings with her.  Transported in w/c.

## 2024-04-16 NOTE — Progress Notes (Signed)
 Echocardiogram 2D Echocardiogram has been performed.  [name] [name] RDCS 04/16/2024, 9:34 AM

## 2024-04-16 NOTE — Progress Notes (Signed)
 Reviewed AVS with patient and caregiver floor to complete discharge once IV medicatins complete

## 2024-04-16 NOTE — Progress Notes (Signed)
 STROKE TEAM PROGRESS NOTE   73 y.o. female who presented  5/24 with sudden onset dizziness. At home, cbg 60, she drank apple juice, then an hour later was making breakfast and got dizzy.MRI demonstrated multiple acute ischemic infarcts in bilateral cerebellar and cerebral hemispheres. Admitted for full stroke workup.   INTERIM HISTORY/SUBJECTIVE  On exam, husband at bedside. Patient oriented and able to provide history, answer all questions. No neurological deficits on exam.   Patient has previously has heart monitors with no arrhythmias noted.    OBJECTIVE  CBC    Component Value Date/Time   WBC 3.2 (L) 04/16/2024 0656   RBC 2.21 (L) 04/16/2024 0656   HGB 7.3 (L) 04/16/2024 0656   HGB 11.1 04/02/2023 1205   HGB 12.7 01/08/2015 1546   HCT 23.0 (L) 04/16/2024 0656   HCT 32.3 (L) 04/02/2023 1205   HCT 39.7 01/08/2015 1546   PLT 178 04/16/2024 0656   PLT 98 (LL) 04/02/2023 1205   MCV 104.1 (H) 04/16/2024 0656   MCV 99 (H) 04/02/2023 1205   MCV 97.8 01/08/2015 1546   MCH 33.0 04/16/2024 0656   MCHC 31.7 04/16/2024 0656   RDW 17.1 (H) 04/16/2024 0656   RDW 15.8 (H) 04/02/2023 1205   RDW 13.4 01/08/2015 1546   LYMPHSABS 0.7 04/15/2024 1240   LYMPHSABS 1.2 01/08/2015 1546   MONOABS 0.2 04/15/2024 1240   MONOABS 0.3 01/08/2015 1546   EOSABS 0.1 04/15/2024 1240   EOSABS 0.2 01/08/2015 1546   BASOSABS 0.0 04/15/2024 1240   BASOSABS 0.0 01/08/2015 1546    BMET    Component Value Date/Time   NA 137 04/16/2024 0656   NA 143 03/19/2023 1532   NA 143 01/08/2015 1547   K 5.0 04/16/2024 0656   K 4.1 01/08/2015 1547   CL 109 04/16/2024 0656   CL 109 (H) 11/28/2012 1555   CO2 22 04/16/2024 0656   CO2 25 01/08/2015 1547   GLUCOSE 69 (L) 04/16/2024 0656   GLUCOSE 81 01/08/2015 1547   GLUCOSE 72 11/28/2012 1555   BUN 16 04/16/2024 0656   BUN 14 03/19/2023 1532   BUN 11.6 01/08/2015 1547   CREATININE 0.97 04/16/2024 0656   CREATININE 1.12 (H) 12/10/2023 1558   CREATININE  0.8 01/08/2015 1547   CALCIUM  8.1 (L) 04/16/2024 0656   CALCIUM  8.8 01/08/2015 1547   EGFR 80 03/19/2023 1532   GFRNONAA >60 04/16/2024 0656    IMAGING past 24 hours VAS US  LOWER EXTREMITY VENOUS (DVT) Result Date: 04/16/2024  Lower Venous DVT Study Patient Name:  [name]  Date of Exam:   04/16/2024 Medical Rec #: [id number]        Accession #:    [id number] Date of Birth: 01-23-1951         Patient Gender: F Patient Age:   68 years Exam Location:  [facility] Procedure:      VAS US  LOWER EXTREMITY VENOUS (DVT) Referring Phys: Authur Leghorn MELVIN --------------------------------------------------------------------------------  Indications: Stroke.  Comparison Study: 12/22/2023 Negative for DVT Performing Technologist: Gelene Kelly RDCS  Examination Guidelines: A complete evaluation includes B-mode imaging, spectral Doppler, color Doppler, and power Doppler as needed of all accessible portions of each vessel. Bilateral testing is considered an integral part of a complete examination. Limited examinations for reoccurring indications may be performed as noted. The reflux portion of the exam is performed with the patient in reverse Trendelenburg.  +---------+---------------+---------+-----------+----------+--------------+ RIGHT    CompressibilityPhasicitySpontaneityPropertiesThrombus Aging +---------+---------------+---------+-----------+----------+--------------+ CFV      Full  Yes      Yes                                 +---------+---------------+---------+-----------+----------+--------------+ SFJ      Full           Yes      Yes                                 +---------+---------------+---------+-----------+----------+--------------+ FV Prox  Full           Yes      Yes                                 +---------+---------------+---------+-----------+----------+--------------+ FV Mid   Full           Yes      Yes                                  +---------+---------------+---------+-----------+----------+--------------+ FV DistalFull           Yes      Yes                                 +---------+---------------+---------+-----------+----------+--------------+ PFV      Full                                                        +---------+---------------+---------+-----------+----------+--------------+ POP      Full           Yes      Yes                                 +---------+---------------+---------+-----------+----------+--------------+ PTV      Full           Yes      Yes                                 +---------+---------------+---------+-----------+----------+--------------+ PERO     Full           Yes      Yes                                 +---------+---------------+---------+-----------+----------+--------------+ Gastroc  Full                                                        +---------+---------------+---------+-----------+----------+--------------+ GSV      Full           Yes      Yes                                 +---------+---------------+---------+-----------+----------+--------------+   +---------+---------------+---------+-----------+----------+--------------+  LEFT     CompressibilityPhasicitySpontaneityPropertiesThrombus Aging +---------+---------------+---------+-----------+----------+--------------+ CFV      Full           Yes      Yes                                 +---------+---------------+---------+-----------+----------+--------------+ SFJ      Full           Yes      Yes                                 +---------+---------------+---------+-----------+----------+--------------+ FV Prox  Full           Yes      Yes                                 +---------+---------------+---------+-----------+----------+--------------+ FV Mid   Full           Yes      Yes                                  +---------+---------------+---------+-----------+----------+--------------+ FV DistalFull           Yes      Yes                                 +---------+---------------+---------+-----------+----------+--------------+ PFV      Full                                                        +---------+---------------+---------+-----------+----------+--------------+ POP      Full           Yes      Yes                                 +---------+---------------+---------+-----------+----------+--------------+ PTV      Full           Yes      Yes                                 +---------+---------------+---------+-----------+----------+--------------+ PERO     Full           Yes      Yes                                 +---------+---------------+---------+-----------+----------+--------------+ Gastroc  Full                                                        +---------+---------------+---------+-----------+----------+--------------+ GSV      Full           Yes      Yes                                 +---------+---------------+---------+-----------+----------+--------------+  Summary: BILATERAL: - No evidence of deep vein thrombosis seen in the lower extremities, bilaterally. -No evidence of popliteal cyst, bilaterally.   *See table(s) above for measurements and observations.    Preliminary    ECHOCARDIOGRAM COMPLETE Result Date: 04/16/2024    ECHOCARDIOGRAM REPORT   Patient Name:   Icelyn Navarrete Date of Exam: 04/16/2024 Medical Rec #:  [id number]       Height:       64.0 in Accession #:    [id number]      Weight:       145.0 lb Date of Birth:  [birth year]        BSA:          1.706 m Patient Age:    72 years        BP:           91/49 mmHg Patient Gender: F               HR:           74 bpm. Exam Location:  Inpatient Procedure: 2D Echo, Color Doppler and Cardiac Doppler (Both Spectral and Color            Flow Doppler were utilized during procedure). Indications:     Stroke i63.9  History:        Patient has prior history of Echocardiogram examinations, most                 recent 12/23/2023. Portal Hypertension; Risk                 Factors:Hypertension. S/p TIPS procedure.  Sonographer:    Sherline Distel Senior RDCS Referring Phys: Leandro Proffer DE [place] IMPRESSIONS  1. Left ventricular ejection fraction, by estimation, is 70 to 75%. The left ventricle has hyperdynamic function. The left ventricle has no regional wall motion abnormalities. Left ventricular diastolic parameters are indeterminate.  2. Right ventricular systolic function is normal. The right ventricular size is mildly enlarged. There is moderately elevated pulmonary artery systolic pressure. The estimated right ventricular systolic pressure is 45.7 mmHg.  3. Left atrial size was mildly dilated.  4. The mitral valve is degenerative. Mild mitral valve regurgitation. The mean mitral valve gradient is 8.5 mmHg with average heart rate of 77 bpm, increased flow velocities secondary to high flow state, challenging to interpret degree of stenosis. Severe mitral annular calcification.  5. The aortic valve is tricuspid. Aortic valve regurgitation is not visualized. No aortic stenosis is present.  6. The inferior vena cava is dilated in size with <50% respiratory variability, suggesting right atrial pressure of 15 mmHg.  7. Increased flow velocities may be secondary to anemia, thyrotoxicosis, hyperdynamic or high flow state - suspect high flow from TIPS shunt physiology. FINDINGS  Left Ventricle: Left ventricular ejection fraction, by estimation, is 70 to 75%. The left ventricle has hyperdynamic function. The left ventricle has no regional wall motion abnormalities. The left ventricular internal cavity size was normal in size. There is no left ventricular hypertrophy. Left ventricular diastolic parameters are indeterminate. Right Ventricle: The right ventricular size is mildly enlarged. No increase in right ventricular wall  thickness. Right ventricular systolic function is normal. There is moderately elevated pulmonary artery systolic pressure. The tricuspid regurgitant velocity is 2.77 m/s, and with an assumed right atrial pressure of 15 mmHg, the estimated right ventricular systolic pressure is 45.7 mmHg. Left Atrium: Left atrial size was mildly dilated. Right Atrium: Right atrial size was normal in size.  Pericardium: There is no evidence of pericardial effusion. Mitral Valve: The mitral valve is degenerative in appearance. Severe mitral annular calcification. Mild mitral valve regurgitation. MV peak gradient, 16.2 mmHg. The mean mitral valve gradient is 8.5 mmHg with average heart rate of 77 bpm. Tricuspid Valve: The tricuspid valve is normal in structure. Tricuspid valve regurgitation is mild. Aortic Valve: The aortic valve is tricuspid. Aortic valve regurgitation is not visualized. No aortic stenosis is present. Aortic valve mean gradient measures 9.0 mmHg. Aortic valve peak gradient measures 16.1 mmHg. Aortic valve area, by VTI measures 2.00  cm. Pulmonic Valve: The pulmonic valve was normal in structure. Pulmonic valve regurgitation is mild. Aorta: The aortic root and ascending aorta are structurally normal, with no evidence of dilitation. Venous: The inferior vena cava is dilated in size with less than 50% respiratory variability, suggesting right atrial pressure of 15 mmHg. IAS/Shunts: The atrial septum is grossly normal.  LEFT VENTRICLE PLAX 2D LVIDd:         5.10 cm   Diastology LVIDs:         2.80 cm   LV e' medial:    7.29 cm/s LV PW:         0.80 cm   LV E/e' medial:  17.8 LV IVS:        0.70 cm   LV e' lateral:   8.49 cm/s LVOT diam:     1.80 cm   LV E/e' lateral: 15.3 LV SV:         87 LV SV Index:   51 LVOT Area:     2.54 cm  RIGHT VENTRICLE RV S prime:     12.30 cm/s TAPSE (M-mode): 2.3 cm LEFT ATRIUM             Index        RIGHT ATRIUM           Index LA diam:        3.10 cm 1.82 cm/m   RA Area:     14.80 cm LA  Vol (A2C):   53.0 ml 31.06 ml/m  RA Volume:   30.40 ml  17.81 ml/m LA Vol (A4C):   55.0 ml 32.23 ml/m LA Biplane Vol: 53.8 ml 31.53 ml/m  AORTIC VALVE AV Area (Vmax):    1.87 cm AV Area (Vmean):   2.01 cm AV Area (VTI):     2.00 cm AV Vmax:           200.33 cm/s AV Vmean:          143.000 cm/s AV VTI:            0.436 m AV Peak Grad:      16.1 mmHg AV Mean Grad:      9.0 mmHg LVOT Vmax:         147.50 cm/s LVOT Vmean:        113.000 cm/s LVOT VTI:          0.342 m LVOT/AV VTI ratio: 0.79  AORTA Ao Root diam: 2.70 cm Ao Asc diam:  2.90 cm MITRAL VALVE                TRICUSPID VALVE MV Area (PHT): 2.11 cm     TR Peak grad:   30.7 mmHg MV Area VTI:   1.78 cm     TR Vmax:        277.00 cm/s MV Peak grad:  16.2 mmHg MV Mean grad:  8.5 mmHg  SHUNTS MV Vmax:       2.01 m/s     Systemic VTI:  0.34 m MV Vmean:      138.5 cm/s   Systemic Diam: 1.80 cm MV Decel Time: 360 msec MV E velocity: 130.00 cm/s MV A velocity: 166.00 cm/s MV E/A ratio:  0.78 Grady Lawman MD Electronically signed by Grady Lawman MD Signature Date/Time: 04/16/2024/9:50:23 AM    Final     Vitals:   04/16/24 0349 04/16/24 0451 04/16/24 0747 04/16/24 1218  BP: (!) 83/45 (!) 83/71 (!) 91/49 (!) 88/45  Pulse: 81  66 61  Resp: 16  16   Temp: 99.1 F (37.3 C)  98.4 F (36.9 C) (!) 97.5 F (36.4 C)  TempSrc: Oral  Oral Oral  SpO2: 96%  96% 98%  Weight:      Height:         PHYSICAL EXAM General:  Alert, well-nourished, well-developed patient in no acute distress Psych:  Mood and affect appropriate for situation CV: Regular rate and rhythm on monitor Respiratory:  Regular, unlabored respirations on room air GI: Abdomen soft and nontender   NEURO:  Mental Status: AA&Ox3, patient is able to give clear and coherent history Speech/Language: speech is without dysarthria or aphasia.  Naming, repetition, fluency, and comprehension intact.  Cranial Nerves:  II: PERRL. Visual fields full.  III, IV, VI: EOMI. Eyelids elevate  symmetrically.  V: Sensation is intact to light touch and symmetrical to face.  VII: Face is symmetrical resting and smiling VIII: hearing intact to voice. IX, X: Phonation is normal.  ZO:[id number] shrug 5/5. XII: tongue is midline without fasciculations. Motor: 5/5 strength to all muscle groups tested.  Tone: is normal and bulk is normal Sensation- Intact to light touch bilaterally.  Coordination: FTN intact bilaterally, HKS: no ataxia in BLE.  Gait- deferred  Most Recent NIH: 0.    ASSESSMENT/PLAN  Ms. Lacrisha Bielicki is a 73 y.o. female  with hx of colon cancer s/p resection and chemotherapy, hypertension, hypothyroidism, cataracts, GI bleeding, esophageal varices, chemotherapy induced cirrhosis s/p TIPS in 2012 and thrombocytopenia who presented  5/24 with sudden onset dizziness. MRI demonstrated multiple acute ischemic infarcts in bilateral cerebellar and cerebral hemispheres. Admitted for full stroke workup.  NIH on Admission: 4.  Acute Ischemic Infarct:  bilateral  Etiology:  Cardio-embolic versus hypercoagulability secondary to history of Cancer  Code Stroke CT head  No acute abnormality.   Chronic white matter disease  MRI Polly Brink, MRA Head and Neck: Multiple bilateral acute nonhemorrhagic infarcts involving posterior circulation, left greater than right, and anterior circulation on left Scattered subcortical T2 hyperintensities moderately advanced for age No evidence of carotid or vertebral artery dissection, arterial injury, or hemodynamically significant stenosis  BLE DVT: Negative 2D Echo: EF 70 to 75%, moderately elevated PASP, mildly dilated left atria, mild MVR Recommend Cardiology outpatient follow-up  Recommend 30-day heart monitor LDL 69 HgbA1c 3.7 VTE prophylaxis - lovenox  aspirin  81 mg daily prior to admission, now on aspirin  81 mg daily and clopidogrel 75 mg daily for 3 weeks and then aspirin  alone. Therapy recommendations:  Outpatient PT/OT/ST Disposition:   home   Hypertension Home meds:  none Stable SBP goal: normotensive  Hyperlipidemia Home meds:  none LDL 69, goal < 70 High intensity statin not indicated due to LDL within goal  Continue statin at discharge  Diabetes type II Controlled Home meds:  insulin  HgbA1c 3.7, goal < 7.0 CBGs SSI  Other Stroke Risk Factors Hx of Cancer  3b colon 2004,-> resection-> chemo ->remission.  Chemo caused liver outflow problem, had tips then had covid, now has cirrhosis with some enceph issues from that Hx of Headaches  Hospital day # 0   Pt seen by Neuro NP/APP with MD. Note/plan to be edited by MD as needed.    Audrene Lease, DNP [facility] Please use AMION for contact information & EPIC for messaging.   To contact Stroke Continuity provider, please refer to [email or web address]. After hours, contact General Neurology

## 2024-04-16 NOTE — Hospital Course (Signed)
 Ms. 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 She has also had complication from cirrhosis including portal hypertension, esophageal varices, and hepatic encephalopathy.  She underwent TIPS in 2012 as well with no issues with encephalopathy until around 2021 after having covid.   She was hospitalized 12/21/2023 until 12/25/2023 with encephalopathy at that time but also found to have multiple bilateral acute infarcts on MRI brain.  Echo with bubble study at that time showed atrial level shunting consistent with PFO. She followed up with cardiology outpatient and underwent 30-day event monitor which was negative.  She has remained compliant on aspirin . She then followed up with neurology on 03/16/2024.  After her stroke in January it was felt that the chance of stroke recurrence was low and therefore PFO closure was not felt to be warranted.  On this hospitalization she presented mostly with a headache as the concerning symptom.  Per her husband, her mentation was about the same in setting of her underlying cirrhosis and occasional encephalopathy and she seemed to have no significant motor or sensory deficits.  MRI brain was again obtained and showed multiple bilateral acute infarcts involving posterior circulation, left greater than right and anterior circulation on the left. MRA head and neck were negative for significant carotid stenosis. Echo was performed but without bubble study and unchanged. Lower extremity duplex negative for DVT.  She will be referred back to cardiology for discussion of PFO closure along with a Zio patch monitoring. She will continue on aspirin  and Plavix for 21 days followed by aspirin  monotherapy at discharge.

## 2024-04-16 NOTE — Evaluation (Signed)
 Occupational Therapy Evaluation Patient Details Name: [name] MRN: [id number] DOB: September 16, 1951 Today's Date: 04/16/2024   History of Present Illness   73 y.o. female  presenting 04/15/24 with sudden-onset dizziness, headache, and leg heaviness. MRI brain multiple acute infarcts Left >Right (bil cerebellar, occipital, thalami), L frontal  PMH includes: CA of cecum, esophageal varices, HTN, tachybrady syndrome, CVA, cirrhosis, anemia     Clinical Impressions Pt admitted for above, PTA pt reports being ind with ADLs while her husband would complete iADL tasks given her recent admissions for encephalopathy, ambulated with SPC in the home. Pt currently presenting with impaired  balance, benefiting from UE support with RW to ambulate around room with CGA. Pt cognition, strength, and coordination all WFL despite admitting dx. OT to follow-up with pt for another visit while in acute stay to ensure pt remains close to functional baseline before DC. No post acute OT needed.      If plan is discharge home, recommend the following:   Direct supervision/assist for medications management;Assist for transportation     Functional Status Assessment   Patient has not had a recent decline in their functional status     Equipment Recommendations   None recommended by OT     Recommendations for Other Services         Precautions/Restrictions   Precautions Precautions: Fall Recall of Precautions/Restrictions: Intact Restrictions Weight Bearing Restrictions Per Provider Order: No     Mobility Bed Mobility Overal bed mobility: Needs Assistance Bed Mobility: Supine to Sit, Sit to Supine     Supine to sit: HOB elevated, Supervision Sit to supine: Supervision   General bed mobility comments: increased time for bed mobility    Transfers Overall transfer level: Needs assistance Equipment used: 2 person hand held assist Transfers: Sit to/from Stand Sit to Stand: Min assist            General transfer comment: min A for steady assist, second STS from EOB was CGA      Balance Overall balance assessment: Needs assistance Sitting-balance support: Feet supported Sitting balance-Leahy Scale: Good Sitting balance - Comments: sittng EOB   Standing balance support: Bilateral upper extremity supported, During functional activity, Reliant on assistive device for balance Standing balance-Leahy Scale: Fair Standing balance comment: benefits from UE support                           ADL either performed or assessed with clinical judgement   ADL Overall ADL's : Needs assistance/impaired Eating/Feeding: Independent;Sitting   Grooming: Standing;Contact guard assist;Oral care   Upper Body Bathing: Sitting;Set up   Lower Body Bathing: Sitting/lateral leans;Set up   Upper Body Dressing : Sitting;Set up   Lower Body Dressing: Minimal assistance;Sit to/from stand Lower Body Dressing Details (indicate cue type and reason): min A to balance. Toilet Transfer: Rolling walker (2 wheels);Contact guard assist   Toileting- Clothing Manipulation and Hygiene: Set up;Sitting/lateral lean       Functional mobility during ADLs: Contact guard assist;Rolling walker (2 wheels)       Vision Baseline Vision/History: 1 Wears glasses Ability to See in Adequate Light: 0 Adequate Patient Visual Report: No change from baseline Vision Assessment?: Yes Eye Alignment: Within Functional Limits Ocular Range of Motion: Within Functional Limits Alignment/Gaze Preference: Within Defined Limits Tracking/Visual Pursuits: Able to track stimulus in all quads without difficulty Saccades: Within functional limits Convergence: Within functional limits Visual Fields: No apparent deficits     Perception Perception:  Within Functional Limits       Praxis Praxis: [facility]       Pertinent Vitals/Pain Pain Assessment Pain Assessment: Faces Faces Pain Scale: Hurts a little  bit Pain Location: headache Pain Descriptors / Indicators: Headache Pain Intervention(s): Monitored during session     Extremity/Trunk Assessment Upper Extremity Assessment Upper Extremity Assessment: Overall WFL for tasks assessed (BUE strength and coordination intaact)   Lower Extremity Assessment Lower Extremity Assessment: Overall WFL for tasks assessed   Cervical / Trunk Assessment Cervical / Trunk Assessment: Normal   Communication Communication Communication: No apparent difficulties   Cognition Arousal: Alert Behavior During Therapy: WFL for tasks assessed/performed Cognition: No apparent impairments                               Following commands: Intact       Cueing  General Comments   Cueing Techniques: Verbal cues  Husband present and supportive   Exercises     Shoulder Instructions      Home Living Family/patient expects to be discharged to:: Private residence Living Arrangements: Spouse/significant other Available Help at Discharge: Family;Available 24 hours/day Type of Home: House Home Access: Ramped entrance     Home Layout: One level     Bathroom Shower/Tub: Tub/shower unit;Curtain   Firefighter: Standard Bathroom Accessibility: Yes How Accessible: Accessible via walker Home Equipment: Rolling Walker (2 wheels);BSC/3in1;Cane - single point;Wheelchair - manual;Hospital bed;Tub bench   Additional Comments: walker basket. Recent bouts of hepatic encephalopathy.      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 Using RW recently since last week at [facility] following encephalopathy; outside uses cane and holds husband's hand ADLs Comments: Mod I with bADLs, beginnig to perform some simple cooking, husband manages medications and bill pay    OT Problem List:  Impaired balance (sitting and/or standing)   OT Treatment/Interventions: Balance training;Therapeutic activities      OT Goals(Current goals can be found in the care plan section)   Acute Rehab OT Goals Patient Stated Goal: To go home OT Goal Formulation: With patient/family Time For Goal Achievement: 04/30/24 Potential to Achieve Goals: Good ADL Goals Pt Will Perform Grooming: with modified independence;standing Pt Will Perform Lower Body Dressing: with modified independence;sit to/from stand Pt Will Transfer to Toilet: with modified independence;ambulating Pt Will Perform Toileting - Clothing Manipulation and hygiene: with modified independence;sit to/from stand   OT Frequency:  Min 1X/week    Co-evaluation              AM-PAC OT "6 Clicks" Daily Activity     Outcome Measure Help from another person eating meals?: None Help from another person taking care of personal grooming?: A Little Help from another person toileting, which includes using toliet, bedpan, or urinal?: A Little Help from another person bathing (including washing, rinsing, drying)?: A Little Help from another person to put on and taking off regular upper body clothing?: A Little Help from another person to put on and taking off regular lower body clothing?: A Little 6 Click Score: 19   End of Session Equipment Utilized During Treatment: Gait belt;Rolling walker (2 wheels) Nurse Communication: Mobility status  Activity Tolerance: Patient tolerated treatment well Patient left: in bed;with call bell/phone within reach;with bed alarm set;with family/visitor present  OT Visit  Diagnosis: Unsteadiness on feet (R26.81)                Time: 1610-9604 OT Time Calculation (min): 18 min Charges:  OT General Charges $OT Visit: 1 Visit OT Evaluation $OT Eval Low Complexity: 1 Low  04/16/2024  AB, OTR/L  Acute Rehabilitation Services  Office: [phone]   Jorene New 04/16/2024, 12:24 PM

## 2024-04-16 NOTE — TOC CAGE-AID Note (Signed)
 Transition of Care [facility]) - CAGE-AID Screening   Patient Details  Name: [name] MRN: [id number] Date of Birth: [birth year]  Transition of Care [facility]) CM/SW Contact:    Graysen Depaula E Katrina Daddona, LCSW Phone Number: 04/16/2024, 9:49 AM   Clinical Narrative:    CAGE-AID Screening:    Have You Ever Felt You Ought to Cut Down on Your Drinking or Drug Use?: No Have People Annoyed You By [employer] Your Drinking Or Drug Use?: No Have You Felt Bad Or Guilty About Your Drinking Or Drug Use?: No Have You Ever Had a Drink or Used Drugs First Thing In The Morning to Steady Your Nerves or to Get Rid of a Hangover?: No CAGE-AID Score: 0  Substance Abuse Education Offered: No

## 2024-04-16 NOTE — Care Management Obs Status (Signed)
 MEDICARE OBSERVATION STATUS NOTIFICATION   Patient Details  Name: [name] MRN: [id number] Date of Birth: 1951/03/16   Medicare Observation Status Notification Given:  Yes    Jovahn Breit G., RN 04/16/2024, 8:48 AM

## 2024-04-18 ENCOUNTER — Telehealth: Payer: Self-pay

## 2024-04-18 ENCOUNTER — Other Ambulatory Visit: Payer: Self-pay | Admitting: Home Health

## 2024-04-18 DIAGNOSIS — I639 Cerebral infarction, unspecified: Secondary | ICD-10-CM

## 2024-04-18 DIAGNOSIS — R42 Dizziness and giddiness: Secondary | ICD-10-CM

## 2024-04-18 NOTE — Transitions of Care (Post Inpatient/ED Visit) (Signed)
   04/18/2024  Name: Docie Abramovich MRN: [id number] DOB: 07-Mar-1951  Today's TOC FU Call Status: Today's TOC FU Call Status:: Unsuccessful Call (1st Attempt) Unsuccessful Call (1st Attempt) Date: 04/18/24  Attempted to reach the patient regarding the most recent Inpatient/ED visit.  Follow Up Plan: Additional outreach attempts will be made to reach the patient to complete the Transitions of Care (Post Inpatient/ED visit) call.   Signature  Germain Kohler, CMA (AAMA)  [facility] [phone]

## 2024-04-21 ENCOUNTER — Ambulatory Visit: Attending: Nurse Practitioner

## 2024-04-21 VITALS — BP 116/56 | HR 56

## 2024-04-21 DIAGNOSIS — M6281 Muscle weakness (generalized): Secondary | ICD-10-CM | POA: Diagnosis not present

## 2024-04-21 DIAGNOSIS — R278 Other lack of coordination: Secondary | ICD-10-CM | POA: Diagnosis not present

## 2024-04-21 DIAGNOSIS — R262 Difficulty in walking, not elsewhere classified: Secondary | ICD-10-CM | POA: Insufficient documentation

## 2024-04-21 DIAGNOSIS — K7682 Hepatic encephalopathy: Secondary | ICD-10-CM | POA: Diagnosis not present

## 2024-04-21 DIAGNOSIS — R2681 Unsteadiness on feet: Secondary | ICD-10-CM | POA: Diagnosis not present

## 2024-04-21 NOTE — Therapy (Signed)
 OUTPATIENT PHYSICAL THERAPY NEURO EVALUATION   Patient Name: [name] MRN: [id number] DOB:[birth year], 73 y.o., female Today's Date: 04/21/2024   PCP: Winthrop Hawks, NP REFERRING PROVIDER:   Faith Homes, MD    END OF SESSION:  PT End of Session - 04/21/24 1059     Visit Number 1    Number of Visits 17    Date for PT Re-Evaluation 06/16/24    Authorization Type U[employer] medicare- auth required    Progress Note Due on Visit 10    PT Start Time 1100    PT Stop Time 1145    PT Time Calculation (min) 45 min    Equipment Utilized During Treatment Gait belt    Activity Tolerance Patient tolerated treatment well;Patient limited by fatigue    Behavior During Therapy [facility] for tasks assessed/performed             Past Medical History:  Diagnosis Date   Acute hepatic encephalopathy (HCC) 03/30/2024   Acute urinary retention 05/04/2022   Allergy 2006 ?   Contrast dye   Arthritis 2016 ??   Knees and thumb   Cancer (HCC)    cecum   Cataract 2021   Surgery scheduled July 2023   Colon cancer [facility]) 2003   Elevated liver function tests    Esophageal varices (HCC)    Heart murmur On file   Hemorrhage of gastrointestinal tract 05/04/2011   Hypertension 2021   Hypothyroidism    Iron deficiency anemia    Liver disease    chemotherapy complication, per pt, shunts placed to bypass liver   Malignant neoplasm of cecum (HCC)    Portal hypertension (HCC)    Skin cancer 2019   Splenomegaly    Past Surgical History:  Procedure Laterality Date   COLON SURGERY  2004   Cancer   COSMETIC SURGERY  2021   Skin cancer   ESOPHAGEAL VARICE LIGATION     EYE SURGERY     HEMICOLECTOMY  01/08/2003   IR RADIOLOGIST EVAL & MGMT  12/20/2020   IR RADIOLOGIST EVAL & MGMT  05/29/2021   LIVER SURGERY     shunts placed after chemo complication   SKIN FULL THICKNESS GRAFT N/A 09/12/2019   Procedure: debridement and FTSG to the nose from left upper arm;  Surgeon: Barb Bonito, MD;  Location:  [facility];  Service: Plastics;  Laterality: N/A;  2 hours, please   TIPS PROCEDURE     Patient Active Problem List   Diagnosis Date Noted   Patent foramen ovale 04/16/2024   Acute CVA (cerebrovascular accident) (HCC) 04/15/2024   Abnormal CBC 04/10/2024   Hyperkalemia 04/05/2024   Hypoglycemia 04/05/2024   Macrocytic anemia 04/05/2024   History of stroke 01/06/2024   Thrombocytopenia (HCC) 12/21/2023   Decreased GFR 12/10/2023   Preventative health care 09/09/2023   Lower extremity edema 08/10/2023   Cellulitis of both lower extremities 08/04/2023   Altered mental status 08/04/2023   Bradycardia 05/07/2023   Protein-calorie malnutrition, severe (HCC) 02/26/2023   Weight loss 02/24/2023   Other constipation 02/24/2023   Generalized abdominal pain 02/24/2023   Hospital discharge follow-up 02/24/2023   Weakness 01/04/2023   Frequent UTI 05/18/2022   Anemia 05/18/2022   Tachycardia-bradycardia syndrome (HCC) 02/05/2022   Hypomagnesemia 02/04/2022   Hepatic encephalopathy (HCC) 02/03/2022   Basal cell carcinoma (BCC) 05/06/2021   Hypertension 05/06/2021   Decompensated liver cirrhosis with portal HTN and gastric varices 12/04/2020   Murmur, cardiac 03/19/2020   Hypothyroidism  06/30/2019   History of basal cell cancer 06/30/2019   Hx of colon cancer, stage III 11/30/2011   Portal hypertension (HCC) 01/23/2008    ONSET DATE: 04/16/24 referral  REFERRING DIAG: I63.9 (ICD-10-CM) - Acute CVA (cerebrovascular accident) (HCC)   THERAPY DIAG:  Muscle weakness (generalized) - Plan: PT plan of care cert/re-cert  Other lack of coordination - Plan: PT plan of care cert/re-cert  Unsteadiness on feet - Plan: PT plan of care cert/re-cert  Difficulty in walking, not elsewhere classified - Plan: PT plan of care cert/re-cert  Rationale for Evaluation and Treatment: Rehabilitation  SUBJECTIVE:                                                                                                                                                                                              SUBJECTIVE STATEMENT: Patient arrives to clinic with husband, using SPC and holding his hand. Was initially referred for hepatic encephalopathy, but before she was seen , she had a "mini stroke." Per husband, doesn't "use" the cane, but rather carries it. Physically and mentally this is the best she has been since Sept/Oct of last year, per husband,  Pt accompanied by: significant other  PERTINENT HISTORY: colon CA, esophageal varices, heart Murmur, GI bleed, HTN, hypothyroidism, liver dz, portal htn, skin CA, splenomegaly  PAIN:  Are you having pain? No  PRECAUTIONS: Fall   WEIGHT BEARING RESTRICTIONS: No  FALLS: Has patient fallen in last 6 months? No  LIVING ENVIRONMENT: Lives with: lives with their family Lives in: House/apartment   PLOF: Requires assistive device for independence  PATIENT GOALS: Per patient "walk without the cane" per husband "increase her independence"   OBJECTIVE:  Note: Objective measures were completed at Evaluation unless otherwise noted.  DIAGNOSTIC FINDINGS: 04/15/24 brain MRI IMPRESSION: 1. Multiple bilateral acute non-hemorrhagic infarcts involving the posterior circulation, left greater than right, and the anterior circulation on the left. 2. Scattered subcortical T2 hyperintensities are moderately advanced for age. This likely reflects the sequelae of chronic microvascular ischemia.  COGNITION: Overall cognitive status: fluctuates   EDEMA:  fluctuates   TRANSFERS: Sit to stand: Modified independence  Assistive device utilized: Single point cane     Stand to sit: Modified independence  Assistive device utilized: Single point cane      GAIT: Findings: Gait Characteristics: step through pattern, decreased arm swing- Right, decreased arm swing- Left, decreased stride length, Right foot flat, Left foot flat, knee flexed in stance-  Right, knee flexed in stance- Left, shuffling, and narrow BOS, Distance walked: clinic, Assistive device utilized:Single point cane, and Level of assistance: SBA and CGA  FUNCTIONAL TESTS:   [facility] PT Assessment - 04/21/24 0001       Standardized Balance Assessment   Standardized Balance Assessment Timed Up and Go Test    Five times sit to stand comments  32.75 B UE (8/10 fatigue)      Timed Up and Go Test   Normal TUG (seconds) 30.47   SPC + CGA                                                                                                                                        TREATMENT  Self care/ home management: -review of exam findings, activity pacing, energy conservation techniques, expected fatigue after hospitalization    PATIENT EDUCATION: Education details: PT POC, exam findings, see above Person educated: Patient and Spouse Education method: Explanation Education comprehension: verbalized understanding  HOME EXERCISE PROGRAM: To be provided  GOALS: Goals reviewed with patient? Yes  SHORT TERM GOALS: Target date: 05/19/24  Pt will be independent with initial HEP for improved functional strength and endurance  Baseline: to be provided Goal status: INITIAL  2.  Pt will improve 5x STS to </= 25 sec to demo improved functional LE strength and balance   Baseline: 32.75s (8/10 fatigue) Goal status: INITIAL  3.  Pt will improve TUG to </= 25 secs to demonstrated reduced fall risk  Baseline: 30.47s SPC + CGA Goal status: INITIAL  4.  Gait speed goal Baseline: to be assessed Goal status: INITIAL   LONG TERM GOALS: Target date: 06/16/24  Pt will be independent with final HEP for improved functional strength and endurance  Baseline: to be provided Goal status: INITIAL  Pt will improve 5x STS to </= 18 sec to demo improved functional LE strength and balance   Baseline: 32.75s (8/10 fatigue) Goal status: INITIAL  Pt will improve TUG to </= 18 secs to  demonstrated reduced fall risk  Baseline: 30.47s SPC + CGA Goal status: INITIAL  Gait speed goal Baseline: to be assessed Goal status: INITIAL  ASSESSMENT:  CLINICAL IMPRESSION: Patient is a 73 y.o. female who was seen today for physical therapy evaluation and treatment for impaired mobility related to recent hepatic encephalopathy as well as CVA. She is globally deconditioned as a result of these hospitalizations. She's currently ambulating with SPC and requires SBA/CGA for safety given poor endurance and balance reactions. Five times Sit to Stand Test (FTSS) Method: Use a straight back chair with a solid seat that is 17-18" high. Ask participant to sit on the chair with arms folded across their chest.   Instructions: "Stand up and sit down as quickly as possible 5 times, keeping your arms folded across your chest."   Measurement: Stop timing when the participant touches the chair in sitting the 5th time.  TIME: 32.75 sec  Cut off scores indicative of increased fall risk: >12 sec CVA, >16 sec PD, >13 sec vestibular (ANPTA Core  Set of Outcome Measures for Adults with Neurologic Conditions, 2018). Patient completed the Timed Up and Go test (TUG) in 30.47 seconds.  Geriatrics: need for further assessment of fall risk: >/= 12 sec; Recurrent falls: > 15 sec; Vestibular Disorders fall risk: > 15 sec; Parkinson's Disease fall risk: > 16 sec ([email or web address], 2023). She would benefit from skilled PT services to address the above mentioned deficits.     OBJECTIVE IMPAIRMENTS: Abnormal gait, cardiopulmonary status limiting activity, decreased activity tolerance, decreased balance, decreased endurance, decreased knowledge of use of DME, difficulty walking, and decreased strength.   ACTIVITY LIMITATIONS: carrying, lifting, stairs, transfers, hygiene/grooming, locomotion level, and caring for others  PARTICIPATION LIMITATIONS: meal prep, cleaning, laundry, interpersonal relationship, driving,  shopping, and community activity  PERSONAL FACTORS: Age, Fitness, Past/current experiences, Time since onset of injury/illness/exacerbation, Transportation, and 3+ comorbidities: see above are also affecting patient's functional outcome.   REHAB POTENTIAL: Fair chronicity  CLINICAL DECISION MAKING: Stable/uncomplicated  EVALUATION COMPLEXITY: Low  PLAN:  PT FREQUENCY: 2x/week  PT DURATION: 8 weeks  PLANNED INTERVENTIONS: 97164- PT Re-evaluation, 97750- Physical Performance Testing, 97110-Therapeutic exercises, 97530- Therapeutic activity, [id number]- Neuromuscular re-education, 97535- Self Care, 62952- Manual therapy, [phone]- Gait training, [phone]- Orthotic Initial, [phone]- Orthotic/Prosthetic subsequent, [phone]- Aquatic Therapy, [phone]- Electrical stimulation (manual), Patient/Family education, Balance training, Stair training, Vestibular training, Visual/preceptual remediation/compensation, and DME instructions  PLAN FOR NEXT SESSION: HEP, functional strength/endurance, gait speed + goal   Rebecca Campus, PT Rebecca Campus, PT, DPT, CBIS  04/21/2024, 12:45 PM

## 2024-04-23 ENCOUNTER — Emergency Department (HOSPITAL_COMMUNITY)

## 2024-04-23 ENCOUNTER — Encounter (HOSPITAL_COMMUNITY): Payer: Self-pay

## 2024-04-23 ENCOUNTER — Emergency Department (HOSPITAL_COMMUNITY)
Admission: EM | Admit: 2024-04-23 | Discharge: 2024-04-23 | Disposition: A | Discharge status: Home / Self Care | Attending: Emergency Medicine | Admitting: Emergency Medicine

## 2024-04-23 ENCOUNTER — Other Ambulatory Visit: Payer: Self-pay

## 2024-04-23 DIAGNOSIS — Z7982 Long term (current) use of aspirin: Secondary | ICD-10-CM | POA: Diagnosis not present

## 2024-04-23 DIAGNOSIS — I639 Cerebral infarction, unspecified: Secondary | ICD-10-CM | POA: Insufficient documentation

## 2024-04-23 DIAGNOSIS — R6889 Other general symptoms and signs: Secondary | ICD-10-CM | POA: Diagnosis not present

## 2024-04-23 DIAGNOSIS — G936 Cerebral edema: Secondary | ICD-10-CM | POA: Diagnosis not present

## 2024-04-23 DIAGNOSIS — Z7902 Long term (current) use of antithrombotics/antiplatelets: Secondary | ICD-10-CM | POA: Diagnosis not present

## 2024-04-23 DIAGNOSIS — H539 Unspecified visual disturbance: Secondary | ICD-10-CM | POA: Diagnosis not present

## 2024-04-23 DIAGNOSIS — R9082 White matter disease, unspecified: Secondary | ICD-10-CM | POA: Diagnosis not present

## 2024-04-23 DIAGNOSIS — H579 Unspecified disorder of eye and adnexa: Secondary | ICD-10-CM | POA: Diagnosis not present

## 2024-04-23 DIAGNOSIS — H538 Other visual disturbances: Secondary | ICD-10-CM | POA: Diagnosis present

## 2024-04-23 DIAGNOSIS — I6782 Cerebral ischemia: Secondary | ICD-10-CM | POA: Diagnosis not present

## 2024-04-23 DIAGNOSIS — R1110 Vomiting, unspecified: Secondary | ICD-10-CM | POA: Diagnosis not present

## 2024-04-23 LAB — CBC
HCT: 28.2 % — ABNORMAL LOW (ref 36.0–46.0)
Hemoglobin: 8.8 g/dL — ABNORMAL LOW (ref 12.0–15.0)
MCH: 32.5 pg (ref 26.0–34.0)
MCHC: 31.2 g/dL (ref 30.0–36.0)
MCV: 104.1 fL — ABNORMAL HIGH (ref 80.0–100.0)
Platelets: 134 10*3/uL — ABNORMAL LOW (ref 150–400)
RBC: 2.71 MIL/uL — ABNORMAL LOW (ref 3.87–5.11)
RDW: 17 % — ABNORMAL HIGH (ref 11.5–15.5)
WBC: 2.5 10*3/uL — ABNORMAL LOW (ref 4.0–10.5)
nRBC: 0 % (ref 0.0–0.2)

## 2024-04-23 LAB — BASIC METABOLIC PANEL WITH GFR
Anion gap: 6 (ref 5–15)
BUN: 13 mg/dL (ref 8–23)
CO2: 25 mmol/L (ref 22–32)
Calcium: 8.9 mg/dL (ref 8.9–10.3)
Chloride: 107 mmol/L (ref 98–111)
Creatinine, Ser: 0.76 mg/dL (ref 0.44–1.00)
GFR, Estimated: >60 mL/min (ref >60)
Glucose, Bld: 79 mg/dL (ref 70–99)
Potassium: 4.5 mmol/L (ref 3.5–5.1)
Sodium: 138 mmol/L (ref 135–145)

## 2024-04-23 LAB — TROPONIN I (HIGH SENSITIVITY)
Troponin I (High Sensitivity): 16 ng/L (ref <18)
Troponin I (High Sensitivity): 14 ng/L (ref <18)

## 2024-04-23 MED ORDER — CLOPIDOGREL BISULFATE 75 MG PO TABS
75.0000 mg | ORAL_TABLET | Freq: Once | ORAL | Status: AC
  Administered 2024-04-23: 75 mg via ORAL
  Filled 2024-04-23: qty 1

## 2024-04-23 NOTE — ED Triage Notes (Signed)
 Pt BIB [facility] from home c/o visual disturbances in the left eye that resolved before EMS got there but still has some pressure. Pt also c/o palpitations that started this morning. Pt states the same thing happened last week and they told her she had a TIA.

## 2024-04-23 NOTE — Discharge Instructions (Signed)
 Your MRI scan showed some evolution or changes of your prior strokes.  This is likely causing your vision symptoms.  Our neurologist recommended continue taking your current medications at home especially the aspirin  and Plavix , and that you continue to follow-up with your cardiologist and neurologist as an outpatient.  If you have any new strokelike symptoms, including difficulty with speech, loss of vision, numbness or weakness of your arms or legs, severe headache or vertigo or balance problems, please return immediately to the hospital.

## 2024-04-23 NOTE — ED Provider Notes (Signed)
 [place] EMERGENCY DEPARTMENT AT [facility] Provider Note   CSN: [id number] Arrival date & time: 04/23/24  1004     History  Chief Complaint  Patient presents with   Palpitations   Eye Pain    [name] is a 73 y.o. female with a history of cirrhosis, on lactulose , PFO, admission for stroke last week, presented to the ED in the company of her husband with concern for visual changes.  Her husband reports the patient is doing well at home since her recent discharge for her infarcts.  You noted that today the patient is complaining of pressure behind her left eye and now her right eye, and also a "dark clouds over my eyes".  She was also complaining of ringing in her ears.  Husband reports that the patient has chronic low body temperature and often needs a rectal temperature but this is normal for her.  I reviewed the patient's discharge summary from May 25 at which point she was admitted with multiple bilateral acute infarcts noted on MRI, with an echo bubble study showing atrial level shunting consistent with PFO.  She is pending outpatient follow-up with cardiology for potential closure of the PFO but has not had this done yet.  The patient is on aspirin  and Plavix  and has been compliant with her medication at home.  From discharge summary:  MRI brain was again obtained and showed multiple bilateral acute infarcts involving posterior circulation, left greater than right and anterior circulation on the left. MRA head and neck were negative for significant carotid stenosis. Echo was performed but without bubble study and unchanged. Lower extremity duplex negative for DVT.  HPI     Home Medications Prior to Admission medications   Medication Sig Start Date End Date Taking? Authorizing Provider  Accu-Chek Softclix Lancets lancets 3 (three) times daily. 04/05/24   [provider]  aspirin  EC 81 MG tablet Take 1 tablet (81 mg total) by mouth daily. Swallow  whole. 12/26/23   Wynetta Heckle, MD  Blood Glucose Monitoring Suppl DEVI 1 each by Does not apply route in the morning, at noon, and at bedtime. May substitute to any manufacturer covered by patient's insurance. 04/05/24   Daren Eck, DO  clopidogrel  (PLAVIX ) 75 MG tablet Take 1 tablet (75 mg total) by mouth daily for 21 days. 04/16/24 05/07/24  Faith Homes, MD  CRANBERRY PO Take 1 tablet by mouth 2 (two) times daily.    [provider]  furosemide  (LASIX ) 20 MG tablet Take 20-40 mg by mouth See admin instructions. Take blood pressure and depending on reading take 20mg  or 40mg  by mouth daily.    [provider]  Glucose Blood (BLOOD GLUCOSE TEST STRIPS) STRP 1 each by In Vitro route in the morning, at noon, and at bedtime. May substitute to any manufacturer covered by patient's insurance. 04/05/24 05/05/24  Daren Eck, DO  lactulose  (CHRONULAC ) 10 GM/15ML solution Take 45 mLs (30 g total) by mouth 3 (three) times daily. 03/13/23   Pokhrel, Amador Bad, MD  Lancet Device MISC 1 each by Does not apply route in the morning, at noon, and at bedtime. May substitute to any manufacturer covered by patient's insurance. 04/05/24 05/05/24  Daren Eck, DO  Lancets Misc. MISC 1 each by Does not apply route in the morning, at noon, and at bedtime. May substitute to any manufacturer covered by patient's insurance. 04/05/24 05/05/24  Daren Eck, DO  levOCARNitine  (CARNITOR ) 330 MG tablet Take 330 mg by  mouth 3 (three) times daily. 05/23/22   [provider]  levothyroxine  (SYNTHROID ) 25 MCG tablet Take 25 mcg by mouth daily before breakfast.    [provider]  liver oil-zinc  oxide (DESITIN) 40 % ointment Apply topically daily as needed for irritation. 03/13/23   Pokhrel, Amador Bad, MD  magnesium  oxide (MAG-OX) 400 MG tablet Take 1 tablet (400 mg total) by mouth daily. 04/05/24   Daren Eck, DO  Multiple Vitamin (MULTI-VITAMIN) tablet Take 1 tablet by mouth daily.    [provider]  polyethylene glycol (MIRALAX  / GLYCOLAX ) 17 g packet Take 17 g by mouth daily as needed for mild constipation. 03/13/23   Pokhrel, Amador Bad, MD  rifaximin  (XIFAXAN ) 550 MG TABS tablet Take 1 tablet (550 mg total) by mouth 2 (two) times daily. Patient taking differently: Take 550 mg by mouth as needed. Flareups 12/25/23   Wynetta Heckle, MD  spironolactone  (ALDACTONE ) 25 MG tablet Take 50 mg by mouth daily.    [provider]      Allergies    Contrast media [iodinated contrast media]    Review of Systems   Review of Systems  Physical Exam Updated Vital Signs BP 103/64   Pulse (!) 58   Temp (!) 96.3 F (35.7 C) (Temporal)   Resp 18   Ht 5\' 4"  (1.626 m)   Wt 68.9 kg   SpO2 96%   BMI 26.09 kg/m  Physical Exam Constitutional:      General: She is not in acute distress. HENT:     Head: Normocephalic and atraumatic.  Eyes:     Conjunctiva/sclera: Conjunctivae normal.     Pupils: Pupils are equal, round, and reactive to light.  Cardiovascular:     Rate and Rhythm: Normal rate and regular rhythm.  Pulmonary:     Effort: Pulmonary effort is normal. No respiratory distress.  Abdominal:     General: There is no distension.     Tenderness: There is no abdominal tenderness.  Skin:    General: Skin is warm and dry.  Neurological:     Mental Status: She is alert.     Comments: Right upper quadrant abscess on visual testing, no other acute cranial nerve deficits, speech is clear, no sensorimotor deficits on exam  Psychiatric:        Mood and Affect: Mood normal.        Behavior: Behavior normal.     ED Results / Procedures / Treatments   Labs (all labs ordered are listed, but only abnormal results are displayed) Labs Reviewed  CBC - Abnormal; Notable for the following components:      Result Value   WBC 2.5 (*)    RBC 2.71 (*)    Hemoglobin 8.8 (*)    HCT 28.2 (*)    MCV 104.1 (*)    RDW 17.0 (*)    Platelets 134 (*)    All other components within  normal limits  BASIC METABOLIC PANEL WITH GFR  TROPONIN I (HIGH SENSITIVITY)  TROPONIN I (HIGH SENSITIVITY)    EKG None  Radiology MR BRAIN WO CONTRAST Result Date: 04/23/2024 CLINICAL DATA:  73 year old female code stroke presentation on 04/15/2024. Scattered small posterior circulation acute infarcts at that time. Vision changes now in the left eye. EXAM: MRI HEAD WITHOUT CONTRAST TECHNIQUE: Multiplanar, multiecho pulse sequences of the brain and surrounding structures were obtained without intravenous contrast. COMPARISON:  Brain MRI 04/15/2024. FINDINGS: Brain: New 2 cm area of abnormal diffusion in the superior  left occipital pole which appears accompanied by petechial hemorrhage (series 12, image 33), regional cytotoxic edema which is T2 and FLAIR hyperintense. No significant mass effect. Smaller new or increased area of abnormal diffusion also in the lateral more inferior left occipital pole (series 7 image 7). Trace if any hemorrhage there. Left greater than right cerebellar hemisphere abnormal diffusion appears to be unresolved ischemia seen last month. No other new areas of diffusion restriction. Patchy, widely scattered cerebral white matter T2 and FLAIR hyperintensity in both hemispheres appears stable, some with T2 shine through. Chronic right thalamic lacunar infarct is stable. Brainstem remains within normal limits. Superimposed intrinsic increased T1 hyperintensity in the bilateral globus pallidus, with a history of cirrhosis and TIPS noted on report of January CT Abdomen and Pelvis. No midline shift, mass effect, evidence of mass lesion, ventriculomegaly, extra-axial collection. Cervicomedullary junction and pituitary are within normal limits. Vascular: Major intracranial vascular flow voids are stable. Skull and upper cervical spine: Visualized bone marrow signal is within normal limits. Partially visible cervical spine disc degeneration at C3-C4 which appears to result in mild spinal  stenosis. Sinuses/Orbits: Stable and negative. Other: Visible internal auditory structures appear normal. Negative visible scalp and face. IMPRESSION: 1. Positive for Acute on subacute Left PCA territory infarcts with petechial hemorrhage. Otherwise stable appearance of posterior circulation ischemia since 04/15/2024. 2. No malignant hemorrhagic transformation or intracranial mass effect. 3. Underlying advanced chronic white matter disease, nonspecific. And abnormal intrinsic globus pallidus T1 signal in keeping with the patient's history of Cirrhosis, portosystemic shunt. Electronically Signed   By: Marlise Simpers M.D.   On: 04/23/2024 13:11    Procedures Procedures    Medications Ordered in ED Medications  clopidogrel  (PLAVIX ) tablet 75 mg (75 mg Oral Given 04/23/24 1437)    ED Course/ Medical Decision Making/ A&P Clinical Course as of 04/23/24 1530  Sun Apr 23, 2024  1122 No acute neuro deficits at this time [MT]  1314 Paged neurologist [MT]  1326 Dr Alecia Ames neurology reviewed imaging and recent workup - this may be a small new infarct or potential extension of prior infarct, but there is no change in medical management as the recommended therapy still aspirin  and Plavix .  There is no indication for emergent PFO closure in the hospital, with no DVT on prior ultrasound imaging, and no concern for immediate thrombus event. [MT]  1434 Patient's husband and the patient are comfortable going home, they do not want to stay in the hospital do not think they need PT or OT evaluation.  They will follow-up as an outpatient with neurology and cardiology. [MT]    Clinical Course User Index [MT] Rosell Khouri, Janalyn Me, MD                                 Medical Decision Making Amount and/or Complexity of Data Reviewed Labs: ordered. Radiology: ordered.  Risk Prescription drug management.   This patient presents to the ED with concern for blurred vision and vision changes. This involves an extensive  number of treatment options, and is a complaint that carries with it a high risk of complications and morbidity.  The differential diagnosis includes CVA versus recrudescence versus evolution of stroke versus other  Co-morbidities that complicate the patient evaluation: History of recent stroke and cardiovascular risk factors for stroke  Additional history obtained from patient's husband at bedside  External records from outside source obtained and reviewed including recent hospitalization  workup for stroke including MR imaging  I ordered and personally interpreted labs.  The pertinent results include: No emergent findings; chronic stable anemia and mild leukopenia, pancytopenia.  Troponin negative.  I ordered imaging studies including MRI of the brain I independently visualized and interpreted imaging which showed potential acute left posterior infarct versus evolution of prior stroke I agree with the radiologist interpretation  I have reviewed the patients home medicines and have made adjustments as needed  I requested consultation with the neurology,  and discussed lab and imaging findings as well as pertinent plan - they recommend: See ED course  After the interventions noted above, I reevaluated the patient and found that they have: improved - waxing and waning   Disposition:  After consideration of the diagnostic results and the patients response to treatment, I feel that the patient would benefit from close outpatient follow up.         Final Clinical Impression(s) / ED Diagnoses Final diagnoses:  Cerebrovascular accident (CVA), unspecified mechanism [facility])    Rx / DC Orders ED Discharge Orders     None         Kolyn Rozario, Janalyn Me, MD 04/23/24 1530

## 2024-04-23 NOTE — ED Notes (Signed)
 Patient transported to MRI

## 2024-04-23 NOTE — ED Notes (Signed)
 Unable to get a temp oral and axillary tried multiple times

## 2024-04-25 ENCOUNTER — Encounter: Payer: Self-pay | Admitting: Physical Therapy

## 2024-04-25 ENCOUNTER — Ambulatory Visit: Attending: Nurse Practitioner | Admitting: Physical Therapy

## 2024-04-25 VITALS — BP 126/66 | HR 63

## 2024-04-25 DIAGNOSIS — R2681 Unsteadiness on feet: Secondary | ICD-10-CM | POA: Diagnosis not present

## 2024-04-25 DIAGNOSIS — M6281 Muscle weakness (generalized): Secondary | ICD-10-CM | POA: Diagnosis not present

## 2024-04-25 DIAGNOSIS — R278 Other lack of coordination: Secondary | ICD-10-CM | POA: Insufficient documentation

## 2024-04-25 DIAGNOSIS — R262 Difficulty in walking, not elsewhere classified: Secondary | ICD-10-CM | POA: Insufficient documentation

## 2024-04-25 NOTE — Therapy (Unsigned)
 OUTPATIENT PHYSICAL THERAPY NEURO TREATMENT   Patient Name: [name] MRN: [id number] DOB:06/14/1951, 73 y.o., female Today's Date: 04/25/2024   PCP: Winthrop Hawks, NP REFERRING PROVIDER:   Faith Homes, MD    END OF SESSION:  PT End of Session - 04/25/24 1548     Visit Number 2    Number of Visits 17    Date for PT Re-Evaluation 06/16/24    Authorization Type [employer] medicare- auth required    Progress Note Due on Visit 10    PT Start Time 1535    PT Stop Time 1620    PT Time Calculation (min) 45 min    Equipment Utilized During Treatment Gait belt    Activity Tolerance Patient tolerated treatment well;Patient limited by fatigue    Behavior During Therapy [facility] for tasks assessed/performed             Past Medical History:  Diagnosis Date   Acute hepatic encephalopathy (HCC) 03/30/2024   Acute urinary retention 05/04/2022   Allergy 2006 ?   Contrast dye   Arthritis 2016 ??   Knees and thumb   Cancer (HCC)    cecum   Cataract 2021   Surgery scheduled July 2023   Colon cancer [facility]) 2003   Elevated liver function tests    Esophageal varices (HCC)    Heart murmur On file   Hemorrhage of gastrointestinal tract 05/04/2011   Hypertension 2021   Hypothyroidism    Iron deficiency anemia    Liver disease    chemotherapy complication, per pt, shunts placed to bypass liver   Malignant neoplasm of cecum (HCC)    Portal hypertension (HCC)    Skin cancer 2019   Splenomegaly    Past Surgical History:  Procedure Laterality Date   COLON SURGERY  2004   Cancer   COSMETIC SURGERY  2021   Skin cancer   ESOPHAGEAL VARICE LIGATION     EYE SURGERY     HEMICOLECTOMY  01/08/2003   IR RADIOLOGIST EVAL & MGMT  12/20/2020   IR RADIOLOGIST EVAL & MGMT  05/29/2021   LIVER SURGERY     shunts placed after chemo complication   SKIN FULL THICKNESS GRAFT N/A 09/12/2019   Procedure: debridement and FTSG to the nose from left upper arm;  Surgeon: Barb Bonito, MD;  Location:  [facility];  Service: Plastics;  Laterality: N/A;  2 hours, please   TIPS PROCEDURE     Patient Active Problem List   Diagnosis Date Noted   Patent foramen ovale 04/16/2024   Acute CVA (cerebrovascular accident) (HCC) 04/15/2024   Abnormal CBC 04/10/2024   Hyperkalemia 04/05/2024   Hypoglycemia 04/05/2024   Macrocytic anemia 04/05/2024   History of stroke 01/06/2024   Thrombocytopenia (HCC) 12/21/2023   Decreased GFR 12/10/2023   Preventative health care 09/09/2023   Lower extremity edema 08/10/2023   Cellulitis of both lower extremities 08/04/2023   Altered mental status 08/04/2023   Bradycardia 05/07/2023   Protein-calorie malnutrition, severe (HCC) 02/26/2023   Weight loss 02/24/2023   Other constipation 02/24/2023   Generalized abdominal pain 02/24/2023   Hospital discharge follow-up 02/24/2023   Weakness 01/04/2023   Frequent UTI 05/18/2022   Anemia 05/18/2022   Tachycardia-bradycardia syndrome (HCC) 02/05/2022   Hypomagnesemia 02/04/2022   Hepatic encephalopathy (HCC) 02/03/2022   Basal cell carcinoma (BCC) 05/06/2021   Hypertension 05/06/2021   Decompensated liver cirrhosis with portal HTN and gastric varices 12/04/2020   Murmur, cardiac 03/19/2020   Hypothyroidism  06/30/2019   History of basal cell cancer 06/30/2019   Hx of colon cancer, stage III 11/30/2011   Portal hypertension (HCC) 01/23/2008    ONSET DATE: 04/16/24 referral  REFERRING DIAG: I63.9 (ICD-10-CM) - Acute CVA (cerebrovascular accident) (HCC)   THERAPY DIAG:  Muscle weakness (generalized)  Other lack of coordination  Unsteadiness on feet  Difficulty in walking, not elsewhere classified  Rationale for Evaluation and Treatment: Rehabilitation  SUBJECTIVE:                                                                                                                                                                                             SUBJECTIVE STATEMENT: Patient  arrives to clinic with husband, using [facility] and holding therapist hand to ambulate into clinic.  She denies further falls.  She reports she was told she had "an expansion of my stroke".  She reports her visual deficits have returned to baseline (before the expansion).   Pt accompanied by: significant other  PERTINENT HISTORY: colon CA, esophageal varices, heart Murmur, GI bleed, HTN, hypothyroidism, liver dz, portal htn, skin CA, splenomegaly  PAIN:  Are you having pain? No  PRECAUTIONS: Fall   WEIGHT BEARING RESTRICTIONS: No  FALLS: Has patient fallen in last 6 months? No  LIVING ENVIRONMENT: Lives with: lives with their family Lives in: House/apartment   PLOF: Requires assistive device for independence  PATIENT GOALS: Per patient "walk without the cane" per husband "increase her independence"   OBJECTIVE:  Note: Objective measures were completed at Evaluation unless otherwise noted.  DIAGNOSTIC FINDINGS: 04/15/24 brain MRI IMPRESSION: 1. Multiple bilateral acute non-hemorrhagic infarcts involving the posterior circulation, left greater than right, and the anterior circulation on the left. 2. Scattered subcortical T2 hyperintensities are moderately advanced for age. This likely reflects the sequelae of chronic microvascular ischemia.  MRI 04/23/2024 IMPRESSION: 1. Positive for Acute on subacute Left PCA territory infarcts with petechial hemorrhage. Otherwise stable appearance of posterior circulation ischemia since 04/15/2024.   2. No malignant hemorrhagic transformation or intracranial mass effect.   3. Underlying advanced chronic white matter disease, nonspecific. And abnormal intrinsic globus pallidus T1 signal in keeping with the patient's history of Cirrhosis, portosystemic shunt.  COGNITION: Overall cognitive status: fluctuates   EDEMA:  fluctuates   TRANSFERS: Sit to stand: Modified independence  Assistive device utilized: Single point cane     Stand to  sit: Modified independence  Assistive device utilized: Single point cane      GAIT: Findings: Gait Characteristics: step through pattern, decreased arm swing- Right, decreased arm swing- Left, decreased stride length, Right foot flat, Left foot flat, knee flexed  in stance- Right, knee flexed in stance- Left, shuffling, and narrow BOS, Distance walked: clinic, Assistive device utilized:Single point cane, and Level of assistance: SBA and CGA  FUNCTIONAL TESTS:                                                                                                                                 TREATMENT  -STS 2x5, using BUE support for second set -Seated march 2x20 -Unsupported sitting into forward lean w/ forearms on thighs for balance x14 -Seated heel raises x20 -Scapular retraction 2x12 -Seated hip abduction 2x8 w/ red theraband  PATIENT EDUCATION: Education details: Energy conservation handout.  Ways to utilize energy conservation in home and with aerobic tasks during TV commercials vs using seated stepper they have.  Discussed timed bathroom breaks to prevent rushing/limit fall risk and edema management w/ elevation and ankle pumps.  Discussed using 2WW - they are open to working with this in future sessions, but she prefers cane.  Diaphragmatic breathing.   Person educated: Patient and Spouse Education method: Explanation Education comprehension: verbalized understanding  HOME EXERCISE PROGRAM: Access Code: [facility] URL: [email or web address] Date: 04/25/2024 Prepared by: Marilou Showman  Exercises - Sit to Stand with Armchair  - 1 x daily - 4 x weekly - 2-3 sets - 5 reps - Seated March  - 1 x daily - 4 x weekly - 2 sets - 20 reps - Seated Heel Raise  - 1 x daily - 4 x weekly - 1-2 sets - 15 reps - Seated Active Hip Flexion  - 1 x daily - 4 x weekly - 2 sets - 10 reps - Seated Scapular Retraction  - 1 x daily - 4 x weekly - 2-3 sets - 10 reps - Seated Hip Abduction  with Resistance  - 1 x daily - 4 x weekly - 2 sets - 8-10 reps  Patient Education - Understanding Energy Conservation  GOALS: Goals reviewed with patient? Yes  SHORT TERM GOALS: Target date: 05/19/24  Pt will be independent with initial HEP for improved functional strength and endurance  Baseline: to be provided Goal status: INITIAL  2.  Pt will improve 5x STS to </= 25 sec to demo improved functional LE strength and balance   Baseline: 32.75s (8/10 fatigue) Goal status: INITIAL  3.  Pt will improve TUG to </= 25 secs to demonstrated reduced fall risk  Baseline: 30.47s SPC + CGA Goal status: INITIAL  4.  Gait speed goal Baseline: to be assessed Goal status: INITIAL   LONG TERM GOALS: Target date: 06/16/24  Pt will be independent with final HEP for improved functional strength and endurance  Baseline: to be provided Goal status: INITIAL  Pt will improve 5x STS to </= 18 sec to demo improved functional LE strength and balance   Baseline: 32.75s (8/10 fatigue) Goal status: INITIAL  Pt will improve TUG to </= 18 secs to demonstrated reduced fall risk  Baseline: 30.47s  SPC + CGA Goal status: INITIAL  Gait speed goal Baseline: to be assessed Goal status: INITIAL  ASSESSMENT:  CLINICAL IMPRESSION: Patient presents to PT following recent expansion of her CVA area impacting her vision which reportedly has returned to baseline.  She continues to deal with heavy fatigue.  PT established low level HEP and spent extensive time educating on opportunities to utilize principles of energy conservation to complete this among other daily tasks.  She has great social support at home and would benefit from further PT to address dynamic stability and most appropriate AD needs.  Continue per POC.    OBJECTIVE IMPAIRMENTS: Abnormal gait, cardiopulmonary status limiting activity, decreased activity tolerance, decreased balance, decreased endurance, decreased knowledge of use of DME,  difficulty walking, and decreased strength.   ACTIVITY LIMITATIONS: carrying, lifting, stairs, transfers, hygiene/grooming, locomotion level, and caring for others  PARTICIPATION LIMITATIONS: meal prep, cleaning, laundry, interpersonal relationship, driving, shopping, and community activity  PERSONAL FACTORS: Age, Fitness, Past/current experiences, Time since onset of injury/illness/exacerbation, Transportation, and 3+ comorbidities: see above are also affecting patient's functional outcome.   REHAB POTENTIAL: Fair chronicity  CLINICAL DECISION MAKING: Stable/uncomplicated  EVALUATION COMPLEXITY: Low  PLAN:  PT FREQUENCY: 2x/week  PT DURATION: 8 weeks  PLANNED INTERVENTIONS: [id number]- PT Re-evaluation, 97750- Physical Performance Testing, 97110-Therapeutic exercises, 97530- Therapeutic activity, [id number]- Neuromuscular re-education, 97535- Self Care, 16109- Manual therapy, [id number]- Gait training, [phone]- Orthotic Initial, [phone]- Orthotic/Prosthetic subsequent, [phone]- Aquatic Therapy, [phone]- Electrical stimulation (manual), Patient/Family education, Balance training, Stair training, Vestibular training, Visual/preceptual remediation/compensation, and DME instructions  PLAN FOR NEXT SESSION: How is HEP?, functional strength/endurance - arm bike? Seated cardio circuits, step taps, gait speed + goal  They would like to continue OPPT before deciding if HHPT is necessary.  Earlean Glaze, PT, DPT  04/25/2024, 4:24 PM

## 2024-04-25 NOTE — Patient Instructions (Signed)
 Access Code: [facility] URL: [email or web address] Date: 04/25/2024 Prepared by: Marilou Showman  Exercises - Sit to Stand with Armchair  - 1 x daily - 4 x weekly - 2-3 sets - 5 reps - Seated March  - 1 x daily - 4 x weekly - 2 sets - 20 reps - Seated Heel Raise  - 1 x daily - 4 x weekly - 1-2 sets - 15 reps - Seated Active Hip Flexion  - 1 x daily - 4 x weekly - 2 sets - 10 reps - Seated Scapular Retraction  - 1 x daily - 4 x weekly - 2-3 sets - 10 reps - Seated Hip Abduction with Resistance  - 1 x daily - 4 x weekly - 2 sets - 8-10 reps  Patient Education - Network engineer

## 2024-04-26 NOTE — Progress Notes (Addendum)
 " Cardiology Office Note    Patient Name: [name] Date of Encounter: 04/26/2024  Primary Care Provider:  [name], NP Primary Cardiologist:  None Primary Electrophysiologist: None   Past Medical History    Past Medical History:  Diagnosis Date   Acute hepatic encephalopathy (HCC) 03/30/2024   Acute urinary retention 05/04/2022   Allergy 2006 ?   Contrast dye   Arthritis 2016 ??   Knees and thumb   Cancer [facility])    cecum   Cataract 2021   Surgery scheduled July 2023   Colon cancer [facility]) 2003   Elevated liver function tests    Esophageal varices (HCC)    Heart murmur On file   Hemorrhage of gastrointestinal tract 05/04/2011   Hypertension 2021   Hypothyroidism    Iron deficiency anemia    Liver disease    chemotherapy complication, per pt, shunts placed to bypass liver   Malignant neoplasm of cecum (HCC)    Portal hypertension (HCC)    Skin cancer 2019   Splenomegaly     History of Present Illness  [name] is a 73 year old female with a PMH of colon cancer s/p colectomy with chemotherapy in 2003, hypothyroidism, HTN, nonalcoholic cirrhosis with esophageal varices s/p TIPS in 2012, hepatic encephalopathy due to COVID-19 infection 07/2020 who presents today for posthospital follow-up for 80-month follow-up.  [name] was last seen in office on 01/23/2024 for a posthospital follow-up and denied any new cardiac complaints.  Her blood pressures were stable and patient was wearing a 30-day event monitor following recent subacute infarction and positive bubble study.  She was continued on ASA 81 mg.  The results of event monitor showed no evidence of atrial fibrillation or atrial flutter.  She was admitted on 03/30/2024 with altered mental status following increased dose of lactulose  and rifaximin .  She became constipated and confusion disorientation increased.  She was found to be hypothermic in the ED and ammonia levels were 107.  She was started on dextrose , insulin   and Lokelma  with further admission and evaluation.  She underwent a lactulose  enema with improvement to hepatic encephalopathy.  Hyperkalemia was thought to be secondary to spironolactone  which was held and patient was ordered magnesium  supplement for hypomagnesium.  She was seen back in the ED on 04/11/2024 with complaint of acute dizziness and headache.  She underwent MRI of the brain that showed multiple acute infarcts in posterior circulation and MRI of the neck was negative for significant stenosis.  She had a negative lower extremity duplex and echo was completed without bubble study that was unchanged.  She was referred back to cardiology for question of PFO closure and was discharged on ASA and Plavix  x 21 days and then ASA monotherapy.  She was placed on 30-day event monitor prior to discharge.  She was seen again in the ED on 04/23/2024 with pain and palpitations.  She reported pressure behind her left eye and right eye and had complaint of ringing in the ears.  MRI of the brain obtained that showed multiple bilateral acute infarcts patient was continued on 30-day event monitor evaluation pending.  [name] presents today with her husband for posthospital follow-up. She has a history of cirrhosis, which has led to episodes of encephalopathy, during which she becomes very sedentary. This has been associated with swelling in her legs, for which she takes spironolactone  daily and Lasix  as needed based on her blood pressure. Her caregiver notes that her heart rate  tends to increase during these episodes. Her current medications include aspirin , Plavix  (for 21 days), spironolactone , and Lasix . Her blood pressure has been stable, but she experiences swelling in her legs, which is managed with adjustments in her Lasix  dosage.  She was ordered a 30-day event monitor that was placed on 04/24/2024.  She reports no current headache or pressure, and her heart rate is typically around 60, but increases during episodes  of mini-strokes.  During today's visit her husband was concerned regarding discontinuation of Plavix  after 21 days.  He was encouraged to reach out to neurology for further guidance and recommendations regarding Plavix . Patient denies chest pain, palpitations, dyspnea, PND, orthopnea, nausea, vomiting, dizziness, syncope, edema, weight gain, or early satiety.  Discussed the use of AI scribe software for clinical note transcription with the patient, who gave verbal consent to proceed.  History of Present Illness   Review of Systems  Please see the history of present illness.    All other systems reviewed and are otherwise negative except as noted above.  Physical Exam     Wt Readings from Last 3 Encounters:  04/23/24 152 lb (68.9 kg)  04/15/24 145 lb (65.8 kg)  04/10/24 151 lb (68.5 kg)   CD:Uyzmz were no vitals filed for this visit.,There is no height or weight on file to calculate BMI. GEN: Well nourished, well developed in no acute distress Neck: No JVD; No carotid bruits Pulmonary: Clear to auscultation without rales, wheezing or rhonchi  Cardiovascular: Normal rate. Regular rhythm. Normal S1. Normal S2.   Murmurs: There is no murmur.  ABDOMEN: Soft, non-tender, non-distended EXTREMITIES:  No edema; No deformity   EKG/LABS/ Recent Cardiac Studies   ECG personally reviewed by me today -none completed today  Risk Assessment/Calculations:          Lab Results  Component Value Date   WBC 2.5 (L) 04/23/2024   HGB 8.8 (L) 04/23/2024   HCT 28.2 (L) 04/23/2024   MCV 104.1 (H) 04/23/2024   PLT 134 (L) 04/23/2024   Lab Results  Component Value Date   CREATININE 0.76 04/23/2024   BUN 13 04/23/2024   NA 138 04/23/2024   K 4.5 04/23/2024   CL 107 04/23/2024   CO2 25 04/23/2024   Lab Results  Component Value Date   CHOL 117 04/16/2024   HDL 43 04/16/2024   LDLCALC 69 04/16/2024   TRIG 26 04/16/2024   CHOLHDL 2.7 04/16/2024    Lab Results  Component Value Date    HGBA1C 3.7 (L) 04/16/2024   Assessment & Plan    Assessment & Plan  1.History of CVA: -MRI showed a 2 cm acute infarction in the superior left occipital pole with petechiae. No significant mass effect or new diffusion areas. Stable infarction with no malignant hemorrhage. Suspected embolic etiology, possibly from atrial fibrillation or leg clot. No DVT found. No emergent PFO closure needed. - Continue aspirin  therapy. - Discontinue Plavix  after 21 days unless advised otherwise by neurology. - Refer to structural heart cardiologist for PFO closure evaluation. - Order TEE to assess PFO significance if indicated by cardiologist. - Continue 30-day cardiac monitor for atrial fibrillation.s/p subacute infarcts seen on MRI as well as positive bubble study on 2D echo.  2.Acute hepatic encephalopathy:  -Patient doing well with no residual effects or signs of encephalopathy. -Continue current medications per endocrinology   3.Liver cirrhosis with portal hypertension: -s/p TIPS in 2012 at [facility] with most recent abdominal ultrasound showing increased velocities with possible revision  needed. -Continue lactulose  as noted above  -MRI noted a portal systemic shunt, contributing to hepatic encephalopathy and elevated ammonia levels.  4.  Essential hypertension: -Patient's blood pressure today was stable at 118/58 - Temporary discontinuation of spironolactone  due to hypokalemia -Continue spironolactone  50 mg daily   5.  History of SVT: -Patient reports no palpitations or tachycardia since previous follow-up -30-day event monitor in place and pending results.   6.  Hypothyroidism: -Currently monitored by PCP - Continue current treatment plan per PCP  Disposition: Follow-up with Dr. Arnetha as scheduled   Signed, Wyn Raddle, Jackee Shove, NP 04/26/2024, 12:55 PM [facility] "

## 2024-04-27 ENCOUNTER — Encounter: Payer: Self-pay | Admitting: Nurse Practitioner

## 2024-04-27 ENCOUNTER — Ambulatory Visit: Attending: Nurse Practitioner | Admitting: Nurse Practitioner

## 2024-04-27 VITALS — BP 118/58 | HR 64 | Ht 64.0 in | Wt 155.6 lb

## 2024-04-27 DIAGNOSIS — R42 Dizziness and giddiness: Secondary | ICD-10-CM | POA: Diagnosis not present

## 2024-04-27 DIAGNOSIS — I10 Essential (primary) hypertension: Secondary | ICD-10-CM

## 2024-04-27 DIAGNOSIS — K7682 Hepatic encephalopathy: Secondary | ICD-10-CM

## 2024-04-27 DIAGNOSIS — E039 Hypothyroidism, unspecified: Secondary | ICD-10-CM | POA: Diagnosis not present

## 2024-04-27 DIAGNOSIS — I639 Cerebral infarction, unspecified: Secondary | ICD-10-CM | POA: Diagnosis not present

## 2024-04-27 DIAGNOSIS — I471 Supraventricular tachycardia, unspecified: Secondary | ICD-10-CM | POA: Diagnosis not present

## 2024-04-27 NOTE — Patient Instructions (Signed)
 Medication Instructions:  Take 1/2 a tablet of lasix  as need for SOB and or Lower extremity swelling *If you need a refill on your cardiac medications before your next appointment, please call your pharmacy*  Lab Work: No labs  Testing/Procedures: No testing  Follow-Up: At [facility], you and your health needs are our priority.  As part of our continuing mission to provide you with exceptional heart care, our providers are all part of one team.  This team includes your primary Cardiologist (physician) and Advanced Practice Providers or APPs (Physician Assistants and Nurse Practitioners) who all work together to provide you with the care you need, when you need it.  Your next appointment:   The 1st or 2nd week of July   Provider:   Jann Melody, MD    We recommend signing up for the patient portal called "MyChart".  Sign up information is provided on this After Visit Summary.  MyChart is used to connect with patients for Virtual Visits (Telemedicine).  Patients are able to view lab/test results, encounter notes, upcoming appointments, etc.  Non-urgent messages can be sent to your provider as well.   To learn more about what you can do with MyChart, go to [email or web address].

## 2024-05-01 ENCOUNTER — Telehealth: Payer: Self-pay | Admitting: Neurology

## 2024-05-01 NOTE — Telephone Encounter (Signed)
 Call to husband and patient, reviewed Dr. Gracie Lav recommendations and both in agreement and verbalized understanding

## 2024-05-01 NOTE — Telephone Encounter (Signed)
 Pt husband call with wife on phone  in regards to wanting  to speak to Dr. Gracie Lav in regards to continue using  clopidogrel  (PLAVIX ) 75 MG tablet  because pt PCP informed Pt to check with neurologist about medication . Pt was Prescribe 21 day of  medication when she was hospitalized and Pt almost out of medication . Husband is concern.

## 2024-05-01 NOTE — Telephone Encounter (Signed)
 Please call patient, due to her history of esophageal varices, GI bleeding, thrombocytopenia, mild elevation of INR most recent 1.4,   MRI on April 23, 2024 showed small acute infarction with mild bleeding component  After she finish 3 weeks of Plavix , she should go back on aspirin  81 mg alone    GI bleeding, esophageal varices, chemotherapy induced cirrhosis s/p TIPS in 2012 and thrombocytopenia   1. Positive for Acute on subacute Left PCA territory infarcts with petechial hemorrhage. Otherwise stable appearance of posterior circulation ischemia since 04/15/2024.   2. No malignant hemorrhagic transformation or intracranial mass effect.   3. Underlying advanced chronic white matter disease, nonspecific. And abnormal intrinsic globus pallidus T1 signal in keeping with the patient's history of Cirrhosis, portosystemic shunt.

## 2024-05-02 ENCOUNTER — Ambulatory Visit

## 2024-05-02 VITALS — BP 129/65 | HR 57

## 2024-05-02 DIAGNOSIS — R2681 Unsteadiness on feet: Secondary | ICD-10-CM | POA: Diagnosis not present

## 2024-05-02 DIAGNOSIS — R278 Other lack of coordination: Secondary | ICD-10-CM | POA: Diagnosis not present

## 2024-05-02 DIAGNOSIS — M6281 Muscle weakness (generalized): Secondary | ICD-10-CM | POA: Diagnosis not present

## 2024-05-02 DIAGNOSIS — R262 Difficulty in walking, not elsewhere classified: Secondary | ICD-10-CM

## 2024-05-02 NOTE — Therapy (Signed)
 OUTPATIENT PHYSICAL THERAPY NEURO TREATMENT   Patient Name: [name] MRN: [id number] DOB:1950-12-19, 73 y.o., female Today's Date: 05/02/2024   PCP: Winthrop Hawks, NP REFERRING PROVIDER:   Faith Homes, MD    END OF SESSION:  PT End of Session - 05/02/24 1402     Visit Number 3    Number of Visits 17    Date for PT Re-Evaluation 06/16/24    Authorization Type [employer] medicare- auth required    Progress Note Due on Visit 10    PT Start Time 1400    PT Stop Time 1443    PT Time Calculation (min) 43 min    Equipment Utilized During Treatment Gait belt    Activity Tolerance Patient tolerated treatment well;Patient limited by fatigue    Behavior During Therapy [facility] for tasks assessed/performed             Past Medical History:  Diagnosis Date   Acute hepatic encephalopathy (HCC) 03/30/2024   Acute urinary retention 05/04/2022   Allergy 2006 ?   Contrast dye   Arthritis 2016 ??   Knees and thumb   Cancer (HCC)    cecum   Cataract 2021   Surgery scheduled July 2023   Colon cancer [facility]) 2003   Elevated liver function tests    Esophageal varices (HCC)    Heart murmur On file   Hemorrhage of gastrointestinal tract 05/04/2011   Hypertension 2021   Hypothyroidism    Iron deficiency anemia    Liver disease    chemotherapy complication, per pt, shunts placed to bypass liver   Malignant neoplasm of cecum (HCC)    Portal hypertension (HCC)    Skin cancer 2019   Splenomegaly    Past Surgical History:  Procedure Laterality Date   COLON SURGERY  2004   Cancer   COSMETIC SURGERY  2021   Skin cancer   ESOPHAGEAL VARICE LIGATION     EYE SURGERY     HEMICOLECTOMY  01/08/2003   IR RADIOLOGIST EVAL & MGMT  12/20/2020   IR RADIOLOGIST EVAL & MGMT  05/29/2021   LIVER SURGERY     shunts placed after chemo complication   SKIN FULL THICKNESS GRAFT N/A 09/12/2019   Procedure: debridement and FTSG to the nose from left upper arm;  Surgeon: Barb Bonito, MD;  Location:  [facility];  Service: Plastics;  Laterality: N/A;  2 hours, please   TIPS PROCEDURE     Patient Active Problem List   Diagnosis Date Noted   Patent foramen ovale 04/16/2024   Acute CVA (cerebrovascular accident) (HCC) 04/15/2024   Abnormal CBC 04/10/2024   Hyperkalemia 04/05/2024   Hypoglycemia 04/05/2024   Macrocytic anemia 04/05/2024   History of stroke 01/06/2024   Thrombocytopenia (HCC) 12/21/2023   Decreased GFR 12/10/2023   Preventative health care 09/09/2023   Lower extremity edema 08/10/2023   Cellulitis of both lower extremities 08/04/2023   Altered mental status 08/04/2023   Bradycardia 05/07/2023   Protein-calorie malnutrition, severe (HCC) 02/26/2023   Weight loss 02/24/2023   Other constipation 02/24/2023   Generalized abdominal pain 02/24/2023   Hospital discharge follow-up 02/24/2023   Weakness 01/04/2023   Frequent UTI 05/18/2022   Anemia 05/18/2022   Tachycardia-bradycardia syndrome (HCC) 02/05/2022   Hypomagnesemia 02/04/2022   Hepatic encephalopathy (HCC) 02/03/2022   Basal cell carcinoma (BCC) 05/06/2021   Hypertension 05/06/2021   Decompensated liver cirrhosis with portal HTN and gastric varices 12/04/2020   Murmur, cardiac 03/19/2020   Hypothyroidism  06/30/2019   History of basal cell cancer 06/30/2019   Hx of colon cancer, stage III 11/30/2011   Portal hypertension (HCC) 01/23/2008    ONSET DATE: 04/16/24 referral  REFERRING DIAG: I63.9 (ICD-10-CM) - Acute CVA (cerebrovascular accident) (HCC)   THERAPY DIAG:  Muscle weakness (generalized)  Other lack of coordination  Unsteadiness on feet  Difficulty in walking, not elsewhere classified  Rationale for Evaluation and Treatment: Rehabilitation  SUBJECTIVE:                                                                                                                                                                                             SUBJECTIVE STATEMENT: Patient  reports doing fair. Is very busy outside of PT with other dr appts. Doing a lot of walking in terms of that, but not necessarily to prescribed HEP. Denies falls.  Pt accompanied by: significant other  PERTINENT HISTORY: colon CA, esophageal varices, heart Murmur, GI bleed, HTN, hypothyroidism, liver dz, portal htn, skin CA, splenomegaly  PAIN:  Are you having pain? No  PRECAUTIONS: Fall  PATIENT GOALS: Per patient "walk without the cane" per husband "increase her independence"   OBJECTIVE:  Note: Objective measures were completed at Evaluation unless otherwise noted.  DIAGNOSTIC FINDINGS: 04/15/24 brain MRI IMPRESSION: 1. Multiple bilateral acute non-hemorrhagic infarcts involving the posterior circulation, left greater than right, and the anterior circulation on the left. 2. Scattered subcortical T2 hyperintensities are moderately advanced for age. This likely reflects the sequelae of chronic microvascular ischemia.  MRI 04/23/2024 IMPRESSION: 1. Positive for Acute on subacute Left PCA territory infarcts with petechial hemorrhage. Otherwise stable appearance of posterior circulation ischemia since 04/15/2024.   2. No malignant hemorrhagic transformation or intracranial mass effect.   3. Underlying advanced chronic white matter disease, nonspecific. And abnormal intrinsic globus pallidus T1 signal in keeping with the patient's history of Cirrhosis, portosystemic shunt.                                                                                                                             TREATMENT  -scifit  hills level 1 x10 mins B UE/LE for CV endurance  -3x30s toe taps to 4" box with CGA (15s rest between)  -2 rounds of this   Self care/home management: -discussed PT POC in terms of patient tolerance to activity and busy schedule of other medical appts  PATIENT EDUCATION: Education details: continue HEP, see above Person educated: Patient and Spouse Education method:  Explanation Education comprehension: verbalized understanding  HOME EXERCISE PROGRAM: Access Code: [facility] URL: [email or web address] Date: 04/25/2024 Prepared by: Marilou Showman  Exercises - Sit to Stand with Armchair  - 1 x daily - 4 x weekly - 2-3 sets - 5 reps - Seated March  - 1 x daily - 4 x weekly - 2 sets - 20 reps - Seated Heel Raise  - 1 x daily - 4 x weekly - 1-2 sets - 15 reps - Seated Active Hip Flexion  - 1 x daily - 4 x weekly - 2 sets - 10 reps - Seated Scapular Retraction  - 1 x daily - 4 x weekly - 2-3 sets - 10 reps - Seated Hip Abduction with Resistance  - 1 x daily - 4 x weekly - 2 sets - 8-10 reps  Patient Education - Understanding Energy Conservation  GOALS: Goals reviewed with patient? Yes  SHORT TERM GOALS: Target date: 05/19/24  Pt will be independent with initial HEP for improved functional strength and endurance  Baseline: to be provided Goal status: INITIAL  2.  Pt will improve 5x STS to </= 25 sec to demo improved functional LE strength and balance   Baseline: 32.75s (8/10 fatigue) Goal status: INITIAL  3.  Pt will improve TUG to </= 25 secs to demonstrated reduced fall risk  Baseline: 30.47s SPC + CGA Goal status: INITIAL  4.  Gait speed goal Baseline: to be assessed Goal status: INITIAL   LONG TERM GOALS: Target date: 06/16/24  Pt will be independent with final HEP for improved functional strength and endurance  Baseline: to be provided Goal status: INITIAL  Pt will improve 5x STS to </= 18 sec to demo improved functional LE strength and balance   Baseline: 32.75s (8/10 fatigue) Goal status: INITIAL  Pt will improve TUG to </= 18 secs to demonstrated reduced fall risk  Baseline: 30.47s SPC + CGA Goal status: INITIAL  Gait speed goal Baseline: to be assessed Goal status: INITIAL  ASSESSMENT:  CLINICAL IMPRESSION: Patient seen for skilled PT session with emphasis on progressing activity tolerance.  Continues to be limited by poor endurance, though this is progressing. Fair+ dynamic standing balance with toe taps to box with LOB x1 requiring MinA from PT to recover. Continues to benefit from modified circuit-type training. Continue POC.     OBJECTIVE IMPAIRMENTS: Abnormal gait, cardiopulmonary status limiting activity, decreased activity tolerance, decreased balance, decreased endurance, decreased knowledge of use of DME, difficulty walking, and decreased strength.   ACTIVITY LIMITATIONS: carrying, lifting, stairs, transfers, hygiene/grooming, locomotion level, and caring for others  PARTICIPATION LIMITATIONS: meal prep, cleaning, laundry, interpersonal relationship, driving, shopping, and community activity  PERSONAL FACTORS: Age, Fitness, Past/current experiences, Time since onset of injury/illness/exacerbation, Transportation, and 3+ comorbidities: see above are also affecting patient's functional outcome.   REHAB POTENTIAL: Fair chronicity  CLINICAL DECISION MAKING: Stable/uncomplicated  EVALUATION COMPLEXITY: Low  PLAN:  PT FREQUENCY: 2x/week  PT DURATION: 8 weeks  PLANNED INTERVENTIONS: [id number]- PT Re-evaluation, 97750- Physical Performance Testing, 97110-Therapeutic exercises, 97530- Therapeutic activity, [id number]- Neuromuscular re-education, 97535- Self Care, [id number]- Manual therapy, [id number]- Gait training, [phone]-  Orthotic Initial, 16109- Orthotic/Prosthetic subsequent, [id number]- Aquatic Therapy, [phone]- Electrical stimulation (manual), Patient/Family education, Balance training, Stair training, Vestibular training, Visual/preceptual remediation/compensation, and DME instructions  PLAN FOR NEXT SESSION: How is HEP?, functional strength/endurance - arm bike? Seated cardio circuits, step taps, gait speed + goal  They would like to continue OPPT before deciding if HHPT is necessary.  Rebecca Campus, PT Rebecca Campus, PT, DPT, CBIS  05/02/2024, 2:47 PM

## 2024-05-05 ENCOUNTER — Ambulatory Visit

## 2024-05-05 DIAGNOSIS — R278 Other lack of coordination: Secondary | ICD-10-CM | POA: Diagnosis not present

## 2024-05-05 DIAGNOSIS — R2681 Unsteadiness on feet: Secondary | ICD-10-CM

## 2024-05-05 DIAGNOSIS — M6281 Muscle weakness (generalized): Secondary | ICD-10-CM | POA: Diagnosis not present

## 2024-05-05 DIAGNOSIS — R262 Difficulty in walking, not elsewhere classified: Secondary | ICD-10-CM

## 2024-05-05 NOTE — Therapy (Signed)
 OUTPATIENT PHYSICAL THERAPY NEURO TREATMENT   Patient Name: [name] MRN: [id number] DOB:1951/10/06, 73 y.o., female Today's Date: 05/05/2024   PCP: Winthrop Hawks, NP REFERRING PROVIDER:   Faith Homes, MD    END OF SESSION:  PT End of Session - 05/05/24 1136     Visit Number 4    Number of Visits 17    Date for PT Re-Evaluation 06/16/24    Authorization Type UHC medicare- auth required    Progress Note Due on Visit 10    PT Start Time 1143    PT Stop Time 1231    PT Time Calculation (min) 48 min    Activity Tolerance Patient tolerated treatment well    Behavior During Therapy [facility] for tasks assessed/performed          Past Medical History:  Diagnosis Date   Acute hepatic encephalopathy (HCC) 03/30/2024   Acute urinary retention 05/04/2022   Allergy 2006 ?   Contrast dye   Arthritis 2016 ??   Knees and thumb   Cancer (HCC)    cecum   Cataract 2021   Surgery scheduled July 2023   Colon cancer [facility]) 2003   Elevated liver function tests    Esophageal varices (HCC)    Heart murmur On file   Hemorrhage of gastrointestinal tract 05/04/2011   Hypertension 2021   Hypothyroidism    Iron deficiency anemia    Liver disease    chemotherapy complication, per pt, shunts placed to bypass liver   Malignant neoplasm of cecum (HCC)    Portal hypertension (HCC)    Skin cancer 2019   Splenomegaly    Past Surgical History:  Procedure Laterality Date   COLON SURGERY  2004   Cancer   COSMETIC SURGERY  2021   Skin cancer   ESOPHAGEAL VARICE LIGATION     EYE SURGERY     HEMICOLECTOMY  01/08/2003   IR RADIOLOGIST EVAL & MGMT  12/20/2020   IR RADIOLOGIST EVAL & MGMT  05/29/2021   LIVER SURGERY     shunts placed after chemo complication   SKIN FULL THICKNESS GRAFT N/A 09/12/2019   Procedure: debridement and FTSG to the nose from left upper arm;  Surgeon: Barb Bonito, MD;  Location: [facility];  Service: Plastics;  Laterality: N/A;  2 hours, please    TIPS PROCEDURE     Patient Active Problem List   Diagnosis Date Noted   Patent foramen ovale 04/16/2024   Acute CVA (cerebrovascular accident) (HCC) 04/15/2024   Abnormal CBC 04/10/2024   Hyperkalemia 04/05/2024   Hypoglycemia 04/05/2024   Macrocytic anemia 04/05/2024   History of stroke 01/06/2024   Thrombocytopenia (HCC) 12/21/2023   Decreased GFR 12/10/2023   Preventative health care 09/09/2023   Lower extremity edema 08/10/2023   Cellulitis of both lower extremities 08/04/2023   Altered mental status 08/04/2023   Bradycardia 05/07/2023   Protein-calorie malnutrition, severe (HCC) 02/26/2023   Weight loss 02/24/2023   Other constipation 02/24/2023   Generalized abdominal pain 02/24/2023   Hospital discharge follow-up 02/24/2023   Weakness 01/04/2023   Frequent UTI 05/18/2022   Anemia 05/18/2022   Tachycardia-bradycardia syndrome (HCC) 02/05/2022   Hypomagnesemia 02/04/2022   Hepatic encephalopathy (HCC) 02/03/2022   Basal cell carcinoma (BCC) 05/06/2021   Hypertension 05/06/2021   Decompensated liver cirrhosis with portal HTN and gastric varices 12/04/2020   Murmur, cardiac 03/19/2020   Hypothyroidism 06/30/2019   History of basal cell cancer 06/30/2019   Hx of colon cancer,  stage III 11/30/2011   Portal hypertension (HCC) 01/23/2008    ONSET DATE: 04/16/24 referral  REFERRING DIAG: I63.9 (ICD-10-CM) - Acute CVA (cerebrovascular accident) (HCC)   THERAPY DIAG:  Muscle weakness (generalized)  Other lack of coordination  Unsteadiness on feet  Difficulty in walking, not elsewhere classified  Rationale for Evaluation and Treatment: Rehabilitation  SUBJECTIVE:                                                                                                                                                                                             SUBJECTIVE STATEMENT: Patient reports doing well- feels as though her endurance and strength is improving. Denies  falls.  Pt accompanied by: significant other  PERTINENT HISTORY: colon CA, esophageal varices, heart Murmur, GI bleed, HTN, hypothyroidism, liver dz, portal htn, skin CA, splenomegaly  PAIN:  Are you having pain? No  PRECAUTIONS: Fall  PATIENT GOALS: Per patient walk without the cane per husband increase her independence   OBJECTIVE:  Note: Objective measures were completed at Evaluation unless otherwise noted.  DIAGNOSTIC FINDINGS: 04/15/24 brain MRI IMPRESSION: 1. Multiple bilateral acute non-hemorrhagic infarcts involving the posterior circulation, left greater than right, and the anterior circulation on the left. 2. Scattered subcortical T2 hyperintensities are moderately advanced for age. This likely reflects the sequelae of chronic microvascular ischemia.  MRI 04/23/2024 IMPRESSION: 1. Positive for Acute on subacute Left PCA territory infarcts with petechial hemorrhage. Otherwise stable appearance of posterior circulation ischemia since 04/15/2024.   2. No malignant hemorrhagic transformation or intracranial mass effect.   3. Underlying advanced chronic white matter disease, nonspecific. And abnormal intrinsic globus pallidus T1 signal in keeping with the patient's history of Cirrhosis, portosystemic shunt.                                                                                                                             TREATMENT  -scifit hills level 3 x10 mins B UE/LE for CV endurance  -blaze pods random color tap   -3 on mirror, 2 on floor  -light CGA provided + prolonged seated  rest breaks between bouts  -48 hits, 57 hits, 65 hits -blaze pods distracting colors x1'30  -57 hits   Self care/home management: -extensive conversation re: PT POC and eventual community exercise reintegration  -husband has a sx consult this PM and that may influence plan moving forward   PATIENT EDUCATION: Education details: continue HEP, see above Person educated:  Patient and Spouse Education method: Explanation Education comprehension: verbalized understanding  HOME EXERCISE PROGRAM: Access Code: [facility] URL: [email or web address] Date: 04/25/2024 Prepared by: Marilou Showman  Exercises - Sit to Stand with Armchair  - 1 x daily - 4 x weekly - 2-3 sets - 5 reps - Seated March  - 1 x daily - 4 x weekly - 2 sets - 20 reps - Seated Heel Raise  - 1 x daily - 4 x weekly - 1-2 sets - 15 reps - Seated Active Hip Flexion  - 1 x daily - 4 x weekly - 2 sets - 10 reps - Seated Scapular Retraction  - 1 x daily - 4 x weekly - 2-3 sets - 10 reps - Seated Hip Abduction with Resistance  - 1 x daily - 4 x weekly - 2 sets - 8-10 reps  Patient Education - Understanding Energy Conservation  GOALS: Goals reviewed with patient? Yes  SHORT TERM GOALS: Target date: 05/19/24  Pt will be independent with initial HEP for improved functional strength and endurance  Baseline: to be provided Goal status: INITIAL  2.  Pt will improve 5x STS to </= 25 sec to demo improved functional LE strength and balance   Baseline: 32.75s (8/10 fatigue) Goal status: INITIAL  3.  Pt will improve TUG to </= 25 secs to demonstrated reduced fall risk  Baseline: 30.47s SPC + CGA Goal status: INITIAL  4.  Gait speed goal Baseline: to be assessed Goal status: INITIAL   LONG TERM GOALS: Target date: 06/16/24  Pt will be independent with final HEP for improved functional strength and endurance  Baseline: to be provided Goal status: INITIAL  Pt will improve 5x STS to </= 18 sec to demo improved functional LE strength and balance   Baseline: 32.75s (8/10 fatigue) Goal status: INITIAL  Pt will improve TUG to </= 18 secs to demonstrated reduced fall risk  Baseline: 30.47s SPC + CGA Goal status: INITIAL  Gait speed goal Baseline: to be assessed Goal status: INITIAL  ASSESSMENT:  CLINICAL IMPRESSION: Patient seen for skilled PT session with emphasis on  patient education and functional endurance. Continues to demonstrate steady improvements in strength and endurance. Greater stability balancing on R LE > L LE. Continue POC.    OBJECTIVE IMPAIRMENTS: Abnormal gait, cardiopulmonary status limiting activity, decreased activity tolerance, decreased balance, decreased endurance, decreased knowledge of use of DME, difficulty walking, and decreased strength.   ACTIVITY LIMITATIONS: carrying, lifting, stairs, transfers, hygiene/grooming, locomotion level, and caring for others  PARTICIPATION LIMITATIONS: meal prep, cleaning, laundry, interpersonal relationship, driving, shopping, and community activity  PERSONAL FACTORS: Age, Fitness, Past/current experiences, Time since onset of injury/illness/exacerbation, Transportation, and 3+ comorbidities: see above are also affecting patient's functional outcome.   REHAB POTENTIAL: Fair chronicity  CLINICAL DECISION MAKING: Stable/uncomplicated  EVALUATION COMPLEXITY: Low  PLAN:  PT FREQUENCY: 2x/week  PT DURATION: 8 weeks  PLANNED INTERVENTIONS: 97164- PT Re-evaluation, 97750- Physical Performance Testing, 97110-Therapeutic exercises, 97530- Therapeutic activity, [id number]- Neuromuscular re-education, 97535- Self Care, 16109- Manual therapy, [id number]- Gait training, [phone]- Orthotic Initial, [id number]- Orthotic/Prosthetic subsequent, [phone]- Aquatic Therapy, [phone]- Electrical stimulation (manual), Patient/Family education, Balance training,  Stair training, Vestibular training, Visual/preceptual remediation/compensation, and DME instructions  PLAN FOR NEXT SESSION: How is HEP?, functional strength/endurance - arm bike? Seated cardio circuits, step taps, gait speed + goal  They would like to continue OPPT before deciding if HHPT is necessary.  Rebecca Campus, PT Rebecca Campus, PT, DPT, CBIS  05/05/2024, 12:43 PM

## 2024-05-08 ENCOUNTER — Other Ambulatory Visit: Payer: Medicare Other

## 2024-05-08 DIAGNOSIS — I8510 Secondary esophageal varices without bleeding: Secondary | ICD-10-CM | POA: Diagnosis not present

## 2024-05-08 DIAGNOSIS — K7682 Hepatic encephalopathy: Secondary | ICD-10-CM | POA: Diagnosis not present

## 2024-05-08 DIAGNOSIS — K7469 Other cirrhosis of liver: Secondary | ICD-10-CM | POA: Diagnosis not present

## 2024-05-08 DIAGNOSIS — K766 Portal hypertension: Secondary | ICD-10-CM | POA: Diagnosis not present

## 2024-05-09 ENCOUNTER — Ambulatory Visit

## 2024-05-09 VITALS — BP 108/68 | HR 59

## 2024-05-09 DIAGNOSIS — R2681 Unsteadiness on feet: Secondary | ICD-10-CM | POA: Diagnosis not present

## 2024-05-09 DIAGNOSIS — M6281 Muscle weakness (generalized): Secondary | ICD-10-CM

## 2024-05-09 DIAGNOSIS — R262 Difficulty in walking, not elsewhere classified: Secondary | ICD-10-CM | POA: Diagnosis not present

## 2024-05-09 DIAGNOSIS — R278 Other lack of coordination: Secondary | ICD-10-CM | POA: Diagnosis not present

## 2024-05-09 NOTE — Therapy (Signed)
 OUTPATIENT PHYSICAL THERAPY NEURO TREATMENT   Patient Name: Lanesha Azzaro MRN: [id number] DOB:[birth year], 73 y.o., female Today's Date: 05/09/2024   PCP: Winthrop Hawks, NP REFERRING PROVIDER:   Faith Homes, MD    END OF SESSION:  PT End of Session - 05/09/24 1402     Visit Number 5    Number of Visits 17    Date for PT Re-Evaluation 06/16/24    Authorization Type UHC medicare- auth required    Progress Note Due on Visit 10    PT Start Time 1400    PT Stop Time 1445    PT Time Calculation (min) 45 min    Equipment Utilized During Treatment Gait belt    Activity Tolerance Patient tolerated treatment well    Behavior During Therapy [facility] for tasks assessed/performed          Past Medical History:  Diagnosis Date   Acute hepatic encephalopathy (HCC) 03/30/2024   Acute urinary retention 05/04/2022   Allergy 2006 ?   Contrast dye   Arthritis 2016 ??   Knees and thumb   Cancer (HCC)    cecum   Cataract 2021   Surgery scheduled July 2023   Colon cancer [facility]) 2003   Elevated liver function tests    Esophageal varices (HCC)    Heart murmur On file   Hemorrhage of gastrointestinal tract 05/04/2011   Hypertension 2021   Hypothyroidism    Iron deficiency anemia    Liver disease    chemotherapy complication, per pt, shunts placed to bypass liver   Malignant neoplasm of cecum (HCC)    Portal hypertension (HCC)    Skin cancer 2019   Splenomegaly    Past Surgical History:  Procedure Laterality Date   COLON SURGERY  2004   Cancer   COSMETIC SURGERY  2021   Skin cancer   ESOPHAGEAL VARICE LIGATION     EYE SURGERY     HEMICOLECTOMY  01/08/2003   IR RADIOLOGIST EVAL & MGMT  12/20/2020   IR RADIOLOGIST EVAL & MGMT  05/29/2021   LIVER SURGERY     shunts placed after chemo complication   SKIN FULL THICKNESS GRAFT N/A 09/12/2019   Procedure: debridement and FTSG to the nose from left upper arm;  Surgeon: Barb Bonito, MD;  Location: [facility];   Service: Plastics;  Laterality: N/A;  2 hours, please   TIPS PROCEDURE     Patient Active Problem List   Diagnosis Date Noted   Patent foramen ovale 04/16/2024   Acute CVA (cerebrovascular accident) (HCC) 04/15/2024   Abnormal CBC 04/10/2024   Hyperkalemia 04/05/2024   Hypoglycemia 04/05/2024   Macrocytic anemia 04/05/2024   History of stroke 01/06/2024   Thrombocytopenia (HCC) 12/21/2023   Decreased GFR 12/10/2023   Preventative health care 09/09/2023   Lower extremity edema 08/10/2023   Cellulitis of both lower extremities 08/04/2023   Altered mental status 08/04/2023   Bradycardia 05/07/2023   Protein-calorie malnutrition, severe (HCC) 02/26/2023   Weight loss 02/24/2023   Other constipation 02/24/2023   Generalized abdominal pain 02/24/2023   Hospital discharge follow-up 02/24/2023   Weakness 01/04/2023   Frequent UTI 05/18/2022   Anemia 05/18/2022   Tachycardia-bradycardia syndrome (HCC) 02/05/2022   Hypomagnesemia 02/04/2022   Hepatic encephalopathy (HCC) 02/03/2022   Basal cell carcinoma (BCC) 05/06/2021   Hypertension 05/06/2021   Decompensated liver cirrhosis with portal HTN and gastric varices 12/04/2020   Murmur, cardiac 03/19/2020   Hypothyroidism 06/30/2019   History of basal  cell cancer 06/30/2019   Hx of colon cancer, stage III 11/30/2011   Portal hypertension (HCC) 01/23/2008    ONSET DATE: 04/16/24 referral  REFERRING DIAG: I63.9 (ICD-10-CM) - Acute CVA (cerebrovascular accident) (HCC)   THERAPY DIAG:  Muscle weakness (generalized)  Other lack of coordination  Unsteadiness on feet  Difficulty in walking, not elsewhere classified  Rationale for Evaluation and Treatment: Rehabilitation  SUBJECTIVE:                                                                                                                                                                                             SUBJECTIVE STATEMENT: Patient arrives to clinic with husband,  using SPC. Did go to [facility] yesterday and walked 2 laps. Denies falls.  Pt accompanied by: significant other  PERTINENT HISTORY: colon CA, esophageal varices, heart Murmur, GI bleed, HTN, hypothyroidism, liver dz, portal htn, skin CA, splenomegaly  PAIN:  Are you having pain? No  PRECAUTIONS: Fall  PATIENT GOALS: Per patient walk without the cane per husband increase her independence   OBJECTIVE:  Note: Objective measures were completed at Evaluation unless otherwise noted.  DIAGNOSTIC FINDINGS: 04/15/24 brain MRI IMPRESSION: 1. Multiple bilateral acute non-hemorrhagic infarcts involving the posterior circulation, left greater than right, and the anterior circulation on the left. 2. Scattered subcortical T2 hyperintensities are moderately advanced for age. This likely reflects the sequelae of chronic microvascular ischemia.  MRI 04/23/2024 IMPRESSION: 1. Positive for Acute on subacute Left PCA territory infarcts with petechial hemorrhage. Otherwise stable appearance of posterior circulation ischemia since 04/15/2024.   2. No malignant hemorrhagic transformation or intracranial mass effect.   3. Underlying advanced chronic white matter disease, nonspecific. And abnormal intrinsic globus pallidus T1 signal in keeping with the patient's history of Cirrhosis, portosystemic shunt.                                                                                                                             TREATMENT  -scifit hills level 3 x10 mins B UE/LE for CV endurance   [facility] PT Assessment - 05/09/24 0001  Standardized Balance Assessment   Standardized Balance Assessment 10 meter walk test    10 Meter Walk .70m/s         -Fwd steps over 4 hurdle  -lateral stepping over 4 hurdle  PATIENT EDUCATION: Education details: continue HEP Person educated: Patient and Spouse Education method: Explanation Education comprehension: verbalized understanding  HOME EXERCISE  PROGRAM: Access Code: [facility] URL: [email or web address] Date: 04/25/2024 Prepared by: Marilou Showman  Exercises - Sit to Stand with Armchair  - 1 x daily - 4 x weekly - 2-3 sets - 5 reps - Seated March  - 1 x daily - 4 x weekly - 2 sets - 20 reps - Seated Heel Raise  - 1 x daily - 4 x weekly - 1-2 sets - 15 reps - Seated Active Hip Flexion  - 1 x daily - 4 x weekly - 2 sets - 10 reps - Seated Scapular Retraction  - 1 x daily - 4 x weekly - 2-3 sets - 10 reps - Seated Hip Abduction with Resistance  - 1 x daily - 4 x weekly - 2 sets - 8-10 reps  Patient Education - Understanding Energy Conservation  GOALS: Goals reviewed with patient? Yes  SHORT TERM GOALS: Target date: 05/19/24  Pt will be independent with initial HEP for improved functional strength and endurance  Baseline: to be provided Goal status: INITIAL  2.  Pt will improve 5x STS to </= 25 sec to demo improved functional LE strength and balance   Baseline: 32.75s (8/10 fatigue) Goal status: INITIAL  3.  Pt will improve TUG to </= 25 secs to demonstrated reduced fall risk  Baseline: 30.47s SPC + CGA Goal status: INITIAL  4.  Pt will improve gait speed to >/= .33m/s to demonstrate improved community ambulation  Baseline: .65m/s Goal status: REVISED   LONG TERM GOALS: Target date: 06/16/24  Pt will be independent with final HEP for improved functional strength and endurance  Baseline: to be provided Goal status: INITIAL  Pt will improve 5x STS to </= 18 sec to demo improved functional LE strength and balance   Baseline: 32.75s (8/10 fatigue) Goal status: INITIAL  Pt will improve TUG to </= 18 secs to demonstrated reduced fall risk  Baseline: 30.47s SPC + CGA Goal status: INITIAL  Pt will improve gait speed to >/= .49m/s to demonstrate improved community ambulation Baseline: .44m/s Goal status: REVISED  ASSESSMENT:  CLINICAL IMPRESSION: Patient seen for skilled PT session with  emphasis on continuing activity tolerance. 10 Meter Walk Test: Patient instructed to walk 10 meters (32.8 ft) as quickly and as safely as possible at their normal speed x2 and at a fast speed x2. Time measured from 2 meter mark to 8 meter mark to accommodate ramp-up and ramp-down.  Normal speed: .35m/s Cut off scores: <0.4 m/s = household Ambulator, 0.4-0.8 m/s = limited community Ambulator, >0.8 m/s = community Ambulator, >1.2 m/s = crossing a street, <1.0 = increased fall risk MCID 0.05 m/s (small), 0.13 m/s (moderate), 0.06 m/s (significant)  (ANPTA Core Set of Outcome Measures for Adults with Neurologic Conditions, 2018). She is improving with tolerating greater bouts of activity prior to needing a seated rest break. Continue POC.     OBJECTIVE IMPAIRMENTS: Abnormal gait, cardiopulmonary status limiting activity, decreased activity tolerance, decreased balance, decreased endurance, decreased knowledge of use of DME, difficulty walking, and decreased strength.   ACTIVITY LIMITATIONS: carrying, lifting, stairs, transfers, hygiene/grooming, locomotion level, and caring for others  PARTICIPATION LIMITATIONS: meal prep, cleaning,  laundry, interpersonal relationship, driving, shopping, and community activity  PERSONAL FACTORS: Age, Fitness, Past/current experiences, Time since onset of injury/illness/exacerbation, Transportation, and 3+ comorbidities: see above are also affecting patient's functional outcome.   REHAB POTENTIAL: Fair chronicity  CLINICAL DECISION MAKING: Stable/uncomplicated  EVALUATION COMPLEXITY: Low  PLAN:  PT FREQUENCY: 2x/week  PT DURATION: 8 weeks  PLANNED INTERVENTIONS: 97164- PT Re-evaluation, 97750- Physical Performance Testing, 97110-Therapeutic exercises, 97530- Therapeutic activity, [id number]- Neuromuscular re-education, 97535- Self Care, 16109- Manual therapy, [id number]- Gait training, [phone]- Orthotic Initial, [phone]- Orthotic/Prosthetic subsequent, [phone]- Aquatic  Therapy, [phone]- Electrical stimulation (manual), Patient/Family education, Balance training, Stair training, Vestibular training, Visual/preceptual remediation/compensation, and DME instructions  PLAN FOR NEXT SESSION: How is HEP?, functional strength/endurance - arm bike? Seated cardio circuits, step taps, simple dual tasking  Rebecca Campus, PT Rebecca Campus, PT, DPT, CBIS  05/09/2024, 3:04 PM

## 2024-05-11 ENCOUNTER — Ambulatory Visit (INDEPENDENT_AMBULATORY_CARE_PROVIDER_SITE_OTHER): Admitting: Nurse Practitioner

## 2024-05-11 ENCOUNTER — Encounter: Payer: Self-pay | Admitting: Nurse Practitioner

## 2024-05-11 VITALS — BP 112/64 | HR 57 | Temp 97.6°F | Ht 64.0 in | Wt 153.1 lb

## 2024-05-11 DIAGNOSIS — I639 Cerebral infarction, unspecified: Secondary | ICD-10-CM

## 2024-05-11 DIAGNOSIS — Q2112 Patent foramen ovale: Secondary | ICD-10-CM | POA: Diagnosis not present

## 2024-05-11 DIAGNOSIS — Z09 Encounter for follow-up examination after completed treatment for conditions other than malignant neoplasm: Secondary | ICD-10-CM | POA: Diagnosis not present

## 2024-05-11 DIAGNOSIS — H539 Unspecified visual disturbance: Secondary | ICD-10-CM | POA: Insufficient documentation

## 2024-05-11 NOTE — Assessment & Plan Note (Signed)
 History of the same currently being followed by cardiology.

## 2024-05-11 NOTE — Progress Notes (Signed)
 Acute Office Visit  Subjective:     Patient ID: [name], female    DOB: August 07, 1951, 73 y.o.   MRN: [id number]  Chief Complaint  Patient presents with   Medical Management of Chronic Issues    Here for 4 wk f/u. Wants to discuss magnesium .     HPI Patient is in today for hosptial follow up  Patient was last seen by me on 04/10/2024 for hospital follow-up.  Since then patient has had a hospital admission for CVA.  Patient was in the hospital on 04/15/2024.  She started having sudden onset dizziness and then developed a headache.  She had some heaviness in her legs with vision involvement patient had a CT head that came back normal patient was admitted.  Patient underwent MR a head and neck along with MRI brain.  Patient's MRI brain showed multiple bilateral acute nonhemorrhagic infarcts involving the posterior circulation left greater than right.  Patient was discharged on 04/16/2024.  They want to follow-up with cardiology for PFO closure.  Zio patch ordered and patient was started on Plavix .  They will Plavix  with aspirin  for 21 days and then drop down just to aspirin .  Patient would like to the hospital on 04/23/2024.  Patient had a repeat MR head that showed possible subacute infarct.  Neurology was consulted and states he could be branching from previous or acute.  They went home with follow-up with patient PT, OT, neurology, and cardiology.  She is doing PT and feels like it is helping. States that she has improved her walking.  Spouse states that there are some mental changes  States that two days ago she felt like there hairs across her vision. Stataes that it stared on the left side and then went   Review of Systems  Constitutional:  Negative for chills and fever.  Eyes:  Positive for blurred vision. Negative for pain, discharge and redness.  Respiratory:  Negative for shortness of breath.   Cardiovascular:  Negative for chest pain.  Neurological:  Negative for headaches.         Objective:    BP 112/64   Pulse (!) 57   Temp 97.6 F (36.4 C) (Tympanic)   Ht 5' 4 (1.626 m)   Wt 153 lb 2 oz (69.5 kg)   SpO2 97%   BMI 26.28 kg/m    Physical Exam Vitals and nursing note reviewed.  Constitutional:      Appearance: Normal appearance.   Cardiovascular:     Rate and Rhythm: Normal rate and regular rhythm.     Heart sounds: Normal heart sounds.  Pulmonary:     Effort: Pulmonary effort is normal.     Breath sounds: Normal breath sounds.   Neurological:     General: No focal deficit present.     Mental Status: She is alert. Mental status is at baseline.     Comments: Bilateral upper and lower extremity 5/5    No results found for any visits on 05/11/24.      Assessment & Plan:   Problem List Items Addressed This Visit       Cardiovascular and Mediastinum   Acute CVA (cerebrovascular accident) (HCC)   Noted on MRI.  Patient has no deficits minus some decrease in the mental acuity for patient spouse.  Neurological exam intact.  Patient currently doing PT.  Has follow-up with neurology.      Patent foramen ovale   History of the same currently being followed by  cardiology.        Other   Hospital discharge follow-up - Primary   Did review ED and admission following discharge note.  Reviewed most recent labs and imaging.  Also reviewed ED note post discharge home.  Did recommend patient to continue taking magnesium  daily.      Vision disturbance   Do not think this is related to stroke.  Did encourage patient to be evaluated by eye doctor.       No orders of the defined types were placed in this encounter.   Return in about 4 weeks (around 06/08/2024) for liver, magnesium , CVA.  Margarie Shay, NP

## 2024-05-11 NOTE — Assessment & Plan Note (Signed)
 Did review ED and admission following discharge note.  Reviewed most recent labs and imaging.  Also reviewed ED note post discharge home.  Did recommend patient to continue taking magnesium  daily.

## 2024-05-11 NOTE — Assessment & Plan Note (Signed)
 Do not think this is related to stroke.  Did encourage patient to be evaluated by eye doctor.

## 2024-05-11 NOTE — Patient Instructions (Signed)
 Nice to see you guys today I want to see you I a month, tentatively  If you need me before call me

## 2024-05-11 NOTE — Assessment & Plan Note (Signed)
 Noted on MRI.  Patient has no deficits minus some decrease in the mental acuity for patient spouse.  Neurological exam intact.  Patient currently doing PT.  Has follow-up with neurology.

## 2024-05-12 ENCOUNTER — Encounter: Payer: Self-pay | Admitting: Physical Therapy

## 2024-05-12 ENCOUNTER — Ambulatory Visit: Admitting: Physical Therapy

## 2024-05-12 DIAGNOSIS — R278 Other lack of coordination: Secondary | ICD-10-CM

## 2024-05-12 DIAGNOSIS — R2681 Unsteadiness on feet: Secondary | ICD-10-CM

## 2024-05-12 DIAGNOSIS — M6281 Muscle weakness (generalized): Secondary | ICD-10-CM | POA: Diagnosis not present

## 2024-05-12 DIAGNOSIS — R262 Difficulty in walking, not elsewhere classified: Secondary | ICD-10-CM

## 2024-05-12 NOTE — Therapy (Signed)
 OUTPATIENT PHYSICAL THERAPY NEURO TREATMENT   Patient Name: [name] MRN: [id number] DOB:1951/04/06, 73 y.o., female Today's Date: 05/12/2024   PCP: Winthrop Hawks, NP REFERRING PROVIDER:   Faith Homes, MD    END OF SESSION:  PT End of Session - 05/12/24 1413     Visit Number 6    Number of Visits 17    Date for PT Re-Evaluation 06/16/24    Authorization Type [employer] medicare- auth required    Progress Note Due on Visit 10    PT Start Time 1403    PT Stop Time 1455    PT Time Calculation (min) 52 min    Equipment Utilized During Treatment Gait belt    Activity Tolerance Patient tolerated treatment well;Patient limited by fatigue    Behavior During Therapy [facility] for tasks assessed/performed          Past Medical History:  Diagnosis Date   Acute hepatic encephalopathy (HCC) 03/30/2024   Acute urinary retention 05/04/2022   Allergy 2006 ?   Contrast dye   Arthritis 2016 ??   Knees and thumb   Cancer (HCC)    cecum   Cataract 2021   Surgery scheduled July 2023   Colon cancer [facility]) 2003   Elevated liver function tests    Esophageal varices (HCC)    Heart murmur On file   Hemorrhage of gastrointestinal tract 05/04/2011   Hypertension 2021   Hypothyroidism    Iron deficiency anemia    Liver disease    chemotherapy complication, per pt, shunts placed to bypass liver   Malignant neoplasm of cecum (HCC)    Portal hypertension (HCC)    Skin cancer 2019   Splenomegaly    Past Surgical History:  Procedure Laterality Date   COLON SURGERY  2004   Cancer   COSMETIC SURGERY  2021   Skin cancer   ESOPHAGEAL VARICE LIGATION     EYE SURGERY     HEMICOLECTOMY  01/08/2003   IR RADIOLOGIST EVAL & MGMT  12/20/2020   IR RADIOLOGIST EVAL & MGMT  05/29/2021   LIVER SURGERY     shunts placed after chemo complication   SKIN FULL THICKNESS GRAFT N/A 09/12/2019   Procedure: debridement and FTSG to the nose from left upper arm;  Surgeon: Barb Bonito, MD;  Location: [facility]  [place];  Service: Plastics;  Laterality: N/A;  2 hours, please   TIPS PROCEDURE     Patient Active Problem List   Diagnosis Date Noted   Vision disturbance 05/11/2024   Patent foramen ovale 04/16/2024   Acute CVA (cerebrovascular accident) (HCC) 04/15/2024   Abnormal CBC 04/10/2024   Hyperkalemia 04/05/2024   Hypoglycemia 04/05/2024   Macrocytic anemia 04/05/2024   History of stroke 01/06/2024   Thrombocytopenia (HCC) 12/21/2023   Decreased GFR 12/10/2023   Preventative health care 09/09/2023   Lower extremity edema 08/10/2023   Cellulitis of both lower extremities 08/04/2023   Altered mental status 08/04/2023   Bradycardia 05/07/2023   Protein-calorie malnutrition, severe (HCC) 02/26/2023   Weight loss 02/24/2023   Other constipation 02/24/2023   Generalized abdominal pain 02/24/2023   Hospital discharge follow-up 02/24/2023   Weakness 01/04/2023   Frequent UTI 05/18/2022   Anemia 05/18/2022   Tachycardia-bradycardia syndrome (HCC) 02/05/2022   Hypomagnesemia 02/04/2022   Hepatic encephalopathy (HCC) 02/03/2022   Basal cell carcinoma (BCC) 05/06/2021   Hypertension 05/06/2021   Decompensated liver cirrhosis with portal HTN and gastric varices 12/04/2020   Murmur, cardiac 03/19/2020  Hypothyroidism 06/30/2019   History of basal cell cancer 06/30/2019   Hx of colon cancer, stage III 11/30/2011   Portal hypertension (HCC) 01/23/2008    ONSET DATE: 04/16/24 referral  REFERRING DIAG: I63.9 (ICD-10-CM) - Acute CVA (cerebrovascular accident) (HCC)   THERAPY DIAG:  Muscle weakness (generalized)  Other lack of coordination  Unsteadiness on feet  Difficulty in walking, not elsewhere classified  Rationale for Evaluation and Treatment: Rehabilitation  SUBJECTIVE:                                                                                                                                                                                              SUBJECTIVE STATEMENT: Patient arrives to clinic with husband, using SPC. Husband acknowledges pt is off today, but is unable to pinpoint what specifically.  He is worried that no other doctor's visits have noticed her being off and is worried this is encephalopathy vs CVA recovery and needing medication adjustment.  She has not been doing HEP in favor of busy appt schedule and home tasks. Denies falls.  Pt accompanied by: significant other  PERTINENT HISTORY: colon CA, esophageal varices, heart Murmur, GI bleed, HTN, hypothyroidism, liver dz, portal htn, skin CA, splenomegaly  PAIN:  Are you having pain? No  PRECAUTIONS: Fall  PATIENT GOALS: Per patient walk without the cane per husband increase her independence   OBJECTIVE:  Note: Objective measures were completed at Evaluation unless otherwise noted.  DIAGNOSTIC FINDINGS: 04/15/24 brain MRI IMPRESSION: 1. Multiple bilateral acute non-hemorrhagic infarcts involving the posterior circulation, left greater than right, and the anterior circulation on the left. 2. Scattered subcortical T2 hyperintensities are moderately advanced for age. This likely reflects the sequelae of chronic microvascular ischemia.  MRI 04/23/2024 IMPRESSION: 1. Positive for Acute on subacute Left PCA territory infarcts with petechial hemorrhage. Otherwise stable appearance of posterior circulation ischemia since 04/15/2024.   2. No malignant hemorrhagic transformation or intracranial mass effect.   3. Underlying advanced chronic white matter disease, nonspecific. And abnormal intrinsic globus pallidus T1 signal in keeping with the patient's history of Cirrhosis, portosystemic shunt.  TREATMENT  -Arm bike x4 minutes forwards > x4 minutes backwards on level 2.0 for cardiovascular endurance and activity tolerance.  She requires single  seated recovery w/ cues and edu on diaphragmatic breathing to improve tolerance x2 minutes midway through task due to fatigue. Seated cardio circuit, performed x3 w/ bimanual 1lb weights:   -Bicep curls x10  -Punches x10 -Attempted bent arm abduction, R shoulder pain so removed task  -Shoulder ER x12 Postural circuit in seated, performed x2 w/ return demo:  -Posterior shoulder rolls x8  -Scapular squeezes x8 w/ 2 sec hold  -Anterior knee leans x10  -Therapeutic use of self as husband expresses concerns regarding pt management when he has surgery next month.  Pt has no input on situation.  Confirmed pt's daughter will be available for part time help, but husband fears daughter will not notice subtle changes.  PATIENT EDUCATION: Education details: continue HEP as able, walk as tolerated, follow up with MD if any acute changes in cognition or other symptoms that seem off for pt, posture and positioning when sleeping/fatigued to prevent muscular imbalance Person educated: Patient and Spouse Education method: Explanation Education comprehension: verbalized understanding  HOME EXERCISE PROGRAM: Access Code: [facility] URL: [email or web address] Date: 04/25/2024 Prepared by: Marilou Showman  Exercises - Sit to Stand with Armchair  - 1 x daily - 4 x weekly - 2-3 sets - 5 reps - Seated March  - 1 x daily - 4 x weekly - 2 sets - 20 reps - Seated Heel Raise  - 1 x daily - 4 x weekly - 1-2 sets - 15 reps - Seated Active Hip Flexion  - 1 x daily - 4 x weekly - 2 sets - 10 reps - Seated Scapular Retraction  - 1 x daily - 4 x weekly - 2-3 sets - 10 reps - Seated Hip Abduction with Resistance  - 1 x daily - 4 x weekly - 2 sets - 8-10 reps  Patient Education - Understanding Energy Conservation  GOALS: Goals reviewed with patient? Yes  SHORT TERM GOALS: Target date: 05/19/24  Pt will be independent with initial HEP for improved functional strength and endurance  Baseline: to be  provided Goal status: INITIAL  2.  Pt will improve 5x STS to </= 25 sec to demo improved functional LE strength and balance   Baseline: 32.75s (8/10 fatigue) Goal status: INITIAL  3.  Pt will improve TUG to </= 25 secs to demonstrated reduced fall risk  Baseline: 30.47s SPC + CGA Goal status: INITIAL  4.  Pt will improve gait speed to >/= .36m/s to demonstrate improved community ambulation  Baseline: .61m/s Goal status: REVISED   LONG TERM GOALS: Target date: 06/16/24  Pt will be independent with final HEP for improved functional strength and endurance  Baseline: to be provided Goal status: INITIAL  Pt will improve 5x STS to </= 18 sec to demo improved functional LE strength and balance   Baseline: 32.75s (8/10 fatigue) Goal status: INITIAL  Pt will improve TUG to </= 18 secs to demonstrated reduced fall risk  Baseline: 30.47s SPC + CGA Goal status: INITIAL  Pt will improve gait speed to >/= .81m/s to demonstrate improved community ambulation Baseline: .30m/s Goal status: REVISED  ASSESSMENT:  CLINICAL IMPRESSION: Emphasis of skilled session today on cardiac response and upper body circuits for posture and strengthening.  She tolerates light weights and low resistance without issue, but remains fatigued and requires cues for repeated activity as she has little noted carryover.  Her husband has significant concerns about patient being off and PT provides professional perspective, listening, and advice to note symptoms for primary provider in event a major change occurs.  Will continue per POC as long as pt is stable to do so in coming visit.     OBJECTIVE IMPAIRMENTS: Abnormal gait, cardiopulmonary status limiting activity, decreased activity tolerance, decreased balance, decreased endurance, decreased knowledge of use of DME, difficulty walking, and decreased strength.   ACTIVITY LIMITATIONS: carrying, lifting, stairs, transfers, hygiene/grooming, locomotion level, and  caring for others  PARTICIPATION LIMITATIONS: meal prep, cleaning, laundry, interpersonal relationship, driving, shopping, and community activity  PERSONAL FACTORS: Age, Fitness, Past/current experiences, Time since onset of injury/illness/exacerbation, Transportation, and 3+ comorbidities: see above are also affecting patient's functional outcome.   REHAB POTENTIAL: Fair chronicity  CLINICAL DECISION MAKING: Stable/uncomplicated  EVALUATION COMPLEXITY: Low  PLAN:  PT FREQUENCY: 2x/week  PT DURATION: 8 weeks  PLANNED INTERVENTIONS: 97164- PT Re-evaluation, 97750- Physical Performance Testing, 97110-Therapeutic exercises, 97530- Therapeutic activity, [id number]- Neuromuscular re-education, 97535- Self Care, [id number]- Manual therapy, [id number]- Gait training, [phone]- Orthotic Initial, [phone]- Orthotic/Prosthetic subsequent, [phone]- Aquatic Therapy, [phone]- Electrical stimulation (manual), Patient/Family education, Balance training, Stair training, Vestibular training, Visual/preceptual remediation/compensation, and DME instructions  PLAN FOR NEXT SESSION: How is HEP?, functional strength/endurance - arm bike? Seated cardio circuits, step taps, simple dual tasking blaze pods?  Seated speed task, timed puzzle in standing?  Earlean Glaze, PT, DPT  05/12/2024, 3:00 PM

## 2024-05-17 ENCOUNTER — Encounter: Payer: Self-pay | Admitting: Physical Therapy

## 2024-05-17 ENCOUNTER — Ambulatory Visit: Admitting: Physical Therapy

## 2024-05-17 VITALS — BP 108/56 | HR 70

## 2024-05-17 DIAGNOSIS — E860 Dehydration: Secondary | ICD-10-CM | POA: Diagnosis not present

## 2024-05-17 DIAGNOSIS — R410 Disorientation, unspecified: Secondary | ICD-10-CM | POA: Diagnosis not present

## 2024-05-17 DIAGNOSIS — K766 Portal hypertension: Secondary | ICD-10-CM | POA: Diagnosis not present

## 2024-05-17 DIAGNOSIS — R195 Other fecal abnormalities: Secondary | ICD-10-CM | POA: Diagnosis not present

## 2024-05-17 DIAGNOSIS — G9341 Metabolic encephalopathy: Secondary | ICD-10-CM | POA: Diagnosis not present

## 2024-05-17 DIAGNOSIS — R0989 Other specified symptoms and signs involving the circulatory and respiratory systems: Secondary | ICD-10-CM | POA: Diagnosis not present

## 2024-05-17 DIAGNOSIS — E870 Hyperosmolality and hypernatremia: Secondary | ICD-10-CM | POA: Diagnosis not present

## 2024-05-17 DIAGNOSIS — E876 Hypokalemia: Secondary | ICD-10-CM | POA: Diagnosis not present

## 2024-05-17 DIAGNOSIS — K250 Acute gastric ulcer with hemorrhage: Secondary | ICD-10-CM | POA: Diagnosis not present

## 2024-05-17 DIAGNOSIS — R2681 Unsteadiness on feet: Secondary | ICD-10-CM

## 2024-05-17 DIAGNOSIS — E039 Hypothyroidism, unspecified: Secondary | ICD-10-CM | POA: Diagnosis not present

## 2024-05-17 DIAGNOSIS — R4182 Altered mental status, unspecified: Secondary | ICD-10-CM | POA: Diagnosis not present

## 2024-05-17 DIAGNOSIS — R278 Other lack of coordination: Secondary | ICD-10-CM

## 2024-05-17 DIAGNOSIS — K7290 Hepatic failure, unspecified without coma: Secondary | ICD-10-CM | POA: Diagnosis not present

## 2024-05-17 DIAGNOSIS — K7682 Hepatic encephalopathy: Secondary | ICD-10-CM | POA: Diagnosis not present

## 2024-05-17 DIAGNOSIS — I480 Paroxysmal atrial fibrillation: Secondary | ICD-10-CM | POA: Diagnosis not present

## 2024-05-17 DIAGNOSIS — H538 Other visual disturbances: Secondary | ICD-10-CM | POA: Diagnosis not present

## 2024-05-17 DIAGNOSIS — E871 Hypo-osmolality and hyponatremia: Secondary | ICD-10-CM | POA: Diagnosis not present

## 2024-05-17 DIAGNOSIS — R262 Difficulty in walking, not elsewhere classified: Secondary | ICD-10-CM

## 2024-05-17 DIAGNOSIS — R6510 Systemic inflammatory response syndrome (SIRS) of non-infectious origin without acute organ dysfunction: Secondary | ICD-10-CM | POA: Diagnosis not present

## 2024-05-17 DIAGNOSIS — I517 Cardiomegaly: Secondary | ICD-10-CM | POA: Diagnosis not present

## 2024-05-17 DIAGNOSIS — N179 Acute kidney failure, unspecified: Secondary | ICD-10-CM | POA: Diagnosis not present

## 2024-05-17 DIAGNOSIS — M436 Torticollis: Secondary | ICD-10-CM | POA: Diagnosis not present

## 2024-05-17 DIAGNOSIS — R7989 Other specified abnormal findings of blood chemistry: Secondary | ICD-10-CM | POA: Diagnosis not present

## 2024-05-17 DIAGNOSIS — E8809 Other disorders of plasma-protein metabolism, not elsewhere classified: Secondary | ICD-10-CM | POA: Diagnosis not present

## 2024-05-17 DIAGNOSIS — K449 Diaphragmatic hernia without obstruction or gangrene: Secondary | ICD-10-CM | POA: Diagnosis not present

## 2024-05-17 DIAGNOSIS — D509 Iron deficiency anemia, unspecified: Secondary | ICD-10-CM | POA: Diagnosis not present

## 2024-05-17 DIAGNOSIS — K2990 Gastroduodenitis, unspecified, without bleeding: Secondary | ICD-10-CM | POA: Diagnosis not present

## 2024-05-17 DIAGNOSIS — I700 Atherosclerosis of aorta: Secondary | ICD-10-CM | POA: Diagnosis not present

## 2024-05-17 DIAGNOSIS — M6281 Muscle weakness (generalized): Secondary | ICD-10-CM

## 2024-05-17 DIAGNOSIS — R9082 White matter disease, unspecified: Secondary | ICD-10-CM | POA: Diagnosis not present

## 2024-05-17 DIAGNOSIS — R001 Bradycardia, unspecified: Secondary | ICD-10-CM | POA: Diagnosis not present

## 2024-05-17 DIAGNOSIS — I10 Essential (primary) hypertension: Secondary | ICD-10-CM | POA: Diagnosis not present

## 2024-05-17 DIAGNOSIS — D638 Anemia in other chronic diseases classified elsewhere: Secondary | ICD-10-CM | POA: Diagnosis not present

## 2024-05-17 DIAGNOSIS — I959 Hypotension, unspecified: Secondary | ICD-10-CM | POA: Diagnosis not present

## 2024-05-17 NOTE — Therapy (Unsigned)
 OUTPATIENT PHYSICAL THERAPY NEURO TREATMENT   Patient Name: [name] MRN: [id number] DOB:[birth year], 73 y.o., female Today's Date: 05/17/2024   PCP: Lynwood Crandall, NP REFERRING PROVIDER:   Patsy Lenis, MD    END OF SESSION:  PT End of Session - 05/17/24 1409     Visit Number 7    Number of Visits 17    Date for PT Re-Evaluation 06/16/24    Authorization Type [employer] medicare- auth required    Progress Note Due on Visit 10    PT Start Time 1406    PT Stop Time 1448    PT Time Calculation (min) 42 min    Equipment Utilized During Treatment Gait belt    Activity Tolerance Patient tolerated treatment well;Patient limited by fatigue    Behavior During Therapy [facility] for tasks assessed/performed          Past Medical History:  Diagnosis Date   Acute hepatic encephalopathy (HCC) 03/30/2024   Acute urinary retention 05/04/2022   Allergy 2006 ?   Contrast dye   Arthritis 2016 ??   Knees and thumb   Cancer (HCC)    cecum   Cataract 2021   Surgery scheduled July 2023   Colon cancer [facility]) 2003   Elevated liver function tests    Esophageal varices (HCC)    Heart murmur On file   Hemorrhage of gastrointestinal tract 05/04/2011   Hypertension 2021   Hypothyroidism    Iron deficiency anemia    Liver disease    chemotherapy complication, per pt, shunts placed to bypass liver   Malignant neoplasm of cecum (HCC)    Portal hypertension (HCC)    Skin cancer 2019   Splenomegaly    Past Surgical History:  Procedure Laterality Date   COLON SURGERY  2004   Cancer   COSMETIC SURGERY  2021   Skin cancer   ESOPHAGEAL VARICE LIGATION     EYE SURGERY     HEMICOLECTOMY  01/08/2003   IR RADIOLOGIST EVAL & MGMT  12/20/2020   IR RADIOLOGIST EVAL & MGMT  05/29/2021   LIVER SURGERY     shunts placed after chemo complication   SKIN FULL THICKNESS GRAFT N/A 09/12/2019   Procedure: debridement and FTSG to the nose from left upper arm;  Surgeon: Elisabeth Craig RAMAN, MD;  Location: [facility]  [place];  Service: Plastics;  Laterality: N/A;  2 hours, please   TIPS PROCEDURE     Patient Active Problem List   Diagnosis Date Noted   Vision disturbance 05/11/2024   Patent foramen ovale 04/16/2024   Acute CVA (cerebrovascular accident) (HCC) 04/15/2024   Abnormal CBC 04/10/2024   Hyperkalemia 04/05/2024   Hypoglycemia 04/05/2024   Macrocytic anemia 04/05/2024   History of stroke 01/06/2024   Thrombocytopenia (HCC) 12/21/2023   Decreased GFR 12/10/2023   Preventative health care 09/09/2023   Lower extremity edema 08/10/2023   Cellulitis of both lower extremities 08/04/2023   Altered mental status 08/04/2023   Bradycardia 05/07/2023   Protein-calorie malnutrition, severe (HCC) 02/26/2023   Weight loss 02/24/2023   Other constipation 02/24/2023   Generalized abdominal pain 02/24/2023   Hospital discharge follow-up 02/24/2023   Weakness 01/04/2023   Frequent UTI 05/18/2022   Anemia 05/18/2022   Tachycardia-bradycardia syndrome (HCC) 02/05/2022   Hypomagnesemia 02/04/2022   Hepatic encephalopathy (HCC) 02/03/2022   Basal cell carcinoma (BCC) 05/06/2021   Hypertension 05/06/2021   Decompensated liver cirrhosis with portal HTN and gastric varices 12/04/2020   Murmur, cardiac 03/19/2020  Hypothyroidism 06/30/2019   History of basal cell cancer 06/30/2019   Hx of colon cancer, stage III 11/30/2011   Portal hypertension (HCC) 01/23/2008    ONSET DATE: 04/16/24 referral  REFERRING DIAG: I63.9 (ICD-10-CM) - Acute CVA (cerebrovascular accident) (HCC)   THERAPY DIAG:  Muscle weakness (generalized)  Other lack of coordination  Unsteadiness on feet  Difficulty in walking, not elsewhere classified  Rationale for Evaluation and Treatment: Rehabilitation  SUBJECTIVE:                                                                                                                                                                                              SUBJECTIVE STATEMENT: Patient arrives to clinic with husband, using SPC. He states no acute changes other than moving slower the last 2 or so days.  She didn't sleep well last night.  She is moving slower today and husband feels she is sore. Denies falls.  Pt accompanied by: significant other  PERTINENT HISTORY: colon CA, esophageal varices, heart Murmur, GI bleed, HTN, hypothyroidism, liver dz, portal htn, skin CA, splenomegaly  PAIN:  Are you having pain? No  PRECAUTIONS: Fall  PATIENT GOALS: Per patient walk without the cane per husband increase her independence   OBJECTIVE:  Note: Objective measures were completed at Evaluation unless otherwise noted.  DIAGNOSTIC FINDINGS: 04/15/24 brain MRI IMPRESSION: 1. Multiple bilateral acute non-hemorrhagic infarcts involving the posterior circulation, left greater than right, and the anterior circulation on the left. 2. Scattered subcortical T2 hyperintensities are moderately advanced for age. This likely reflects the sequelae of chronic microvascular ischemia.  MRI 04/23/2024 IMPRESSION: 1. Positive for Acute on subacute Left PCA territory infarcts with petechial hemorrhage. Otherwise stable appearance of posterior circulation ischemia since 04/15/2024.   2. No malignant hemorrhagic transformation or intracranial mass effect.   3. Underlying advanced chronic white matter disease, nonspecific. And abnormal intrinsic globus pallidus T1 signal in keeping with the patient's history of Cirrhosis, portosystemic shunt.  TREATMENT  -Discussed pt's lethargy over recent days and wanting to go to bed earlier and moving through her day slower - spouse remains somewhat concerned about this.  Discussed heart monitor (will be done with 30 days on July 5) - purpose of heart monitor as it relates to CVA.  L BP in sitting  prior to inteventions: Vitals:   05/17/24 1414  BP: (!) 108/56  Pulse: 70   -Arm bike x2 minutes forwards > x2 minutes backwards (repeated x2) up to level 3.0 for cardiovascular endurance and activity tolerance. -Rapid alternating toe taps to gumdrop in sitting:  5 rounds of 15 seconds:   -13 taps, moderate dysmetria   -14 taps, does better with outloud counting - less dysmetria   -13 taps, pt counting out loud w/ less dysmetria, but pt only counts 12 (therapist and partner counting 13)   -14 taps   -16 taps -Seated 15lb chair push<>pull 3x10  PATIENT EDUCATION: Education details: continue HEP as able, walk as tolerated, follow up with MD if any acute changes in cognition or other symptoms that seem off for pt Person educated: Patient and Spouse Education method: Explanation Education comprehension: verbalized understanding  HOME EXERCISE PROGRAM: Access Code: [facility] URL: [email or web address] Date: 04/25/2024 Prepared by: Daved Bull  Exercises - Sit to Stand with Armchair  - 1 x daily - 4 x weekly - 2-3 sets - 5 reps - Seated March  - 1 x daily - 4 x weekly - 2 sets - 20 reps - Seated Heel Raise  - 1 x daily - 4 x weekly - 1-2 sets - 15 reps - Seated Active Hip Flexion  - 1 x daily - 4 x weekly - 2 sets - 10 reps - Seated Scapular Retraction  - 1 x daily - 4 x weekly - 2-3 sets - 10 reps - Seated Hip Abduction with Resistance  - 1 x daily - 4 x weekly - 2 sets - 8-10 reps  Patient Education - Understanding Energy Conservation  GOALS: Goals reviewed with patient? Yes  SHORT TERM GOALS: Target date: 05/19/24  Pt will be independent with initial HEP for improved functional strength and endurance  Baseline: to be provided Goal status: INITIAL  2.  Pt will improve 5x STS to </= 25 sec to demo improved functional LE strength and balance   Baseline: 32.75s (8/10 fatigue) Goal status: INITIAL  3.  Pt will improve TUG to </= 25 secs to demonstrated  reduced fall risk  Baseline: 30.47s SPC + CGA Goal status: INITIAL  4.  Pt will improve gait speed to >/= .25m/s to demonstrate improved community ambulation  Baseline: .24m/s Goal status: REVISED   LONG TERM GOALS: Target date: 06/16/24  Pt will be independent with final HEP for improved functional strength and endurance  Baseline: to be provided Goal status: INITIAL  Pt will improve 5x STS to </= 18 sec to demo improved functional LE strength and balance   Baseline: 32.75s (8/10 fatigue) Goal status: INITIAL  Pt will improve TUG to </= 18 secs to demonstrated reduced fall risk  Baseline: 30.47s SPC + CGA Goal status: INITIAL  Pt will improve gait speed to >/= .39m/s to demonstrate improved community ambulation Baseline: .25m/s Goal status: REVISED  ASSESSMENT:  CLINICAL IMPRESSION: Emphasis of skilled session today on cardiac response and upper body circuits for posture and strengthening.  She tolerates light weights and low resistance without issue, but remains fatigued and requires cues for repeated activity as she has  little noted carryover.  Her husband has significant concerns about patient being off and PT provides professional perspective, listening, and advice to note symptoms for primary provider in event a major change occurs.  Will continue per POC as long as pt is stable to do so in coming visit.     OBJECTIVE IMPAIRMENTS: Abnormal gait, cardiopulmonary status limiting activity, decreased activity tolerance, decreased balance, decreased endurance, decreased knowledge of use of DME, difficulty walking, and decreased strength.   ACTIVITY LIMITATIONS: carrying, lifting, stairs, transfers, hygiene/grooming, locomotion level, and caring for others  PARTICIPATION LIMITATIONS: meal prep, cleaning, laundry, interpersonal relationship, driving, shopping, and community activity  PERSONAL FACTORS: Age, Fitness, Past/current experiences, Time since onset of  injury/illness/exacerbation, Transportation, and 3+ comorbidities: see above are also affecting patient's functional outcome.   REHAB POTENTIAL: Fair chronicity  CLINICAL DECISION MAKING: Stable/uncomplicated  EVALUATION COMPLEXITY: Low  PLAN:  PT FREQUENCY: 2x/week  PT DURATION: 8 weeks  PLANNED INTERVENTIONS: 97164- PT Re-evaluation, 97750- Physical Performance Testing, 97110-Therapeutic exercises, 97530- Therapeutic activity, [id number]- Neuromuscular re-education, 97535- Self Care, [id number]- Manual therapy, [id number]- Gait training, [phone]- Orthotic Initial, [phone]- Orthotic/Prosthetic subsequent, [phone]- Aquatic Therapy, [phone]- Electrical stimulation (manual), Patient/Family education, Balance training, Stair training, Vestibular training, Visual/preceptual remediation/compensation, and DME instructions  PLAN FOR NEXT SESSION: How is HEP?, functional strength/endurance - arm bike vs seated [employer]? Seated cardio circuits, step taps, simple dual tasking blaze pods, Seated speed tasks, timed puzzle in standing?  Daved [name], PT, DPT  05/17/2024, 2:53 PM

## 2024-05-18 ENCOUNTER — Ambulatory Visit: Admitting: Internal Medicine

## 2024-05-19 ENCOUNTER — Ambulatory Visit

## 2024-05-19 VITALS — BP 107/59 | HR 65

## 2024-05-19 DIAGNOSIS — R262 Difficulty in walking, not elsewhere classified: Secondary | ICD-10-CM

## 2024-05-19 DIAGNOSIS — M6281 Muscle weakness (generalized): Secondary | ICD-10-CM

## 2024-05-19 DIAGNOSIS — R2681 Unsteadiness on feet: Secondary | ICD-10-CM

## 2024-05-19 DIAGNOSIS — R278 Other lack of coordination: Secondary | ICD-10-CM

## 2024-05-19 NOTE — Therapy (Signed)
 OUTPATIENT PHYSICAL THERAPY NEURO TREATMENT   Patient Name: [name] MRN: [id number] DOB:[birth year], 73 y.o., female Today's Date: 05/19/2024   PCP: Lynwood Crandall, NP REFERRING PROVIDER:   Patsy Lenis, MD    END OF SESSION:  PT End of Session - 05/19/24 1403     Visit Number 8    Number of Visits 17    Date for PT Re-Evaluation 06/16/24    Authorization Type [employer] medicare- auth required    Progress Note Due on Visit 10    PT Start Time 1400    PT Stop Time 1445    PT Time Calculation (min) 45 min    Equipment Utilized During Treatment Gait belt    Activity Tolerance Patient limited by fatigue    Behavior During Therapy Flat affect          Past Medical History:  Diagnosis Date   Acute hepatic encephalopathy (HCC) 03/30/2024   Acute urinary retention 05/04/2022   Allergy 2006 ?   Contrast dye   Arthritis 2016 ??   Knees and thumb   Cancer (HCC)    cecum   Cataract 2021   Surgery scheduled July 2023   Colon cancer [facility]) 2003   Elevated liver function tests    Esophageal varices (HCC)    Heart murmur On file   Hemorrhage of gastrointestinal tract 05/04/2011   Hypertension 2021   Hypothyroidism    Iron deficiency anemia    Liver disease    chemotherapy complication, per pt, shunts placed to bypass liver   Malignant neoplasm of cecum (HCC)    Portal hypertension (HCC)    Skin cancer 2019   Splenomegaly    Past Surgical History:  Procedure Laterality Date   COLON SURGERY  2004   Cancer   COSMETIC SURGERY  2021   Skin cancer   ESOPHAGEAL VARICE LIGATION     EYE SURGERY     HEMICOLECTOMY  01/08/2003   IR RADIOLOGIST EVAL & MGMT  12/20/2020   IR RADIOLOGIST EVAL & MGMT  05/29/2021   LIVER SURGERY     shunts placed after chemo complication   SKIN FULL THICKNESS GRAFT N/A 09/12/2019   Procedure: debridement and FTSG to the nose from left upper arm;  Surgeon: Elisabeth Craig RAMAN, MD;  Location: [facility];  Service: Plastics;  Laterality:  N/A;  2 hours, please   TIPS PROCEDURE     Patient Active Problem List   Diagnosis Date Noted   Vision disturbance 05/11/2024   Patent foramen ovale 04/16/2024   Acute CVA (cerebrovascular accident) (HCC) 04/15/2024   Abnormal CBC 04/10/2024   Hyperkalemia 04/05/2024   Hypoglycemia 04/05/2024   Macrocytic anemia 04/05/2024   History of stroke 01/06/2024   Thrombocytopenia (HCC) 12/21/2023   Decreased GFR 12/10/2023   Preventative health care 09/09/2023   Lower extremity edema 08/10/2023   Cellulitis of both lower extremities 08/04/2023   Altered mental status 08/04/2023   Bradycardia 05/07/2023   Protein-calorie malnutrition, severe (HCC) 02/26/2023   Weight loss 02/24/2023   Other constipation 02/24/2023   Generalized abdominal pain 02/24/2023   Hospital discharge follow-up 02/24/2023   Weakness 01/04/2023   Frequent UTI 05/18/2022   Anemia 05/18/2022   Tachycardia-bradycardia syndrome (HCC) 02/05/2022   Hypomagnesemia 02/04/2022   Hepatic encephalopathy (HCC) 02/03/2022   Basal cell carcinoma (BCC) 05/06/2021   Hypertension 05/06/2021   Decompensated liver cirrhosis with portal HTN and gastric varices 12/04/2020   Murmur, cardiac 03/19/2020   Hypothyroidism 06/30/2019  History of basal cell cancer 06/30/2019   Hx of colon cancer, stage III 11/30/2011   Portal hypertension (HCC) 01/23/2008    ONSET DATE: 04/16/24 referral  REFERRING DIAG: I63.9 (ICD-10-CM) - Acute CVA (cerebrovascular accident) (HCC)   THERAPY DIAG:  Muscle weakness (generalized)  Other lack of coordination  Unsteadiness on feet  Difficulty in walking, not elsewhere classified  Rationale for Evaluation and Treatment: Rehabilitation  SUBJECTIVE:                                                                                                                                                                                             SUBJECTIVE STATEMENT: Patient reports doing fair- is very  tired. Has to be waking up a little earlier than typical this week and it's catching up to her. Denies LOB. Has been using her cane more consistently when she is tired. Denies falls.  Pt accompanied by: significant other  PERTINENT HISTORY: colon CA, esophageal varices, heart Murmur, GI bleed, HTN, hypothyroidism, liver dz, portal htn, skin CA, splenomegaly  PAIN:  Are you having pain? No  PRECAUTIONS: Fall  PATIENT GOALS: Per patient walk without the cane per husband increase her independence   OBJECTIVE:  Note: Objective measures were completed at Evaluation unless otherwise noted.  DIAGNOSTIC FINDINGS: 04/15/24 brain MRI IMPRESSION: 1. Multiple bilateral acute non-hemorrhagic infarcts involving the posterior circulation, left greater than right, and the anterior circulation on the left. 2. Scattered subcortical T2 hyperintensities are moderately advanced for age. This likely reflects the sequelae of chronic microvascular ischemia.  MRI 04/23/2024 IMPRESSION: 1. Positive for Acute on subacute Left PCA territory infarcts with petechial hemorrhage. Otherwise stable appearance of posterior circulation ischemia since 04/15/2024.   2. No malignant hemorrhagic transformation or intracranial mass effect.   3. Underlying advanced chronic white matter disease, nonspecific. And abnormal intrinsic globus pallidus T1 signal in keeping with the patient's history of Cirrhosis, portosystemic shunt.  TREATMENT   [facility] PT Assessment - 05/19/24 0001       Standardized Balance Assessment   Five times sit to stand comments  21.19s   6/10 fatigue   10 Meter Walk .79m/s   SPC (8/10 fatigue)     Timed Up and Go Test   Normal TUG (seconds) 36.03   7/10 fatigue        Self care/home management: -extensive discussion regarding setting up care for patient/husband as he  recovers from sx in 2 weeks  -sleep hygiene  -possibility of transitioning to [facility]   PATIENT EDUCATION: Education details: continue HEP, see above Person educated: Patient and Spouse Education method: Explanation Education comprehension: verbalized understanding  HOME EXERCISE PROGRAM: Access Code: [facility] URL: [email or web address] Date: 04/25/2024 Prepared by: Daved Bull  Exercises - Sit to Stand with Armchair  - 1 x daily - 4 x weekly - 2-3 sets - 5 reps - Seated March  - 1 x daily - 4 x weekly - 2 sets - 20 reps - Seated Heel Raise  - 1 x daily - 4 x weekly - 1-2 sets - 15 reps - Seated Active Hip Flexion  - 1 x daily - 4 x weekly - 2 sets - 10 reps - Seated Scapular Retraction  - 1 x daily - 4 x weekly - 2-3 sets - 10 reps - Seated Hip Abduction with Resistance  - 1 x daily - 4 x weekly - 2 sets - 8-10 reps  Patient Education - Understanding Energy Conservation  GOALS: Goals reviewed with patient? Yes  SHORT TERM GOALS: Target date: 05/19/24  Pt will be independent with initial HEP for improved functional strength and endurance  Baseline: to be provided; provided Goal status: MET  2.  Pt will improve 5x STS to </= 25 sec to demo improved functional LE strength and balance   Baseline: 32.75s (8/10 fatigue); 21.19s no UE (6/10 fatigue) Goal status: MET  3.  Pt will improve TUG to </= 25 secs to demonstrated reduced fall risk  Baseline: 30.47s SPC + CGA; 36.03 SPC + CGA Goal status: NOT MET  4.  Pt will improve gait speed to >/= .22m/s to demonstrate improved community ambulation  Baseline: .56m/s; .70m/s Goal status: NOT MET   LONG TERM GOALS: Target date: 06/16/24  Pt will be independent with final HEP for improved functional strength and endurance  Baseline: to be provided Goal status: INITIAL  Pt will improve 5x STS to </= 18 sec to demo improved functional LE strength and balance   Baseline: 32.75s (8/10 fatigue) Goal status:  INITIAL  Pt will improve TUG to </= 18 secs to demonstrated reduced fall risk  Baseline: 30.47s SPC + CGA Goal status: INITIAL  Pt will improve gait speed to >/= .24m/s to demonstrate improved community ambulation Baseline: .27m/s Goal status: REVISED  ASSESSMENT:  CLINICAL IMPRESSION: Patient seen for skilled PT session with emphasis on STG assessment. She met 2/4 STG. Progress with goals likely influenced by patients level of fatigue today. Five times Sit to Stand Test (FTSS) Method: Use a straight back chair with a solid seat that is 17-18" high. Ask participant to sit on the chair with arms folded across their chest.   Instructions: "Stand up and sit down as quickly as possible 5 times, keeping your arms folded across your chest."   Measurement: Stop timing when the participant touches the chair in sitting the 5th time.  TIME: 21.19 sec  Cut off scores indicative of increased  fall risk: >12 sec CVA, >16 sec PD, >13 sec vestibular (ANPTA Core Set of Outcome Measures for Adults with Neurologic Conditions, 2018). 10 Meter Walk Test: Patient instructed to walk 10 meters (32.8 ft) as quickly and as safely as possible at their normal speed x2 and at a fast speed x2. Time measured from 2 meter mark to 8 meter mark to accommodate ramp-up and ramp-down.  Normal speed: .57m/s Cut off scores: <0.4 m/s = household Ambulator, 0.4-0.8 m/s = limited community Ambulator, >0.8 m/s = community Ambulator, >1.2 m/s = crossing a street, <1.0 = increased fall risk MCID 0.05 m/s (small), 0.13 m/s (moderate), 0.06 m/s (significant)  (ANPTA Core Set of Outcome Measures for Adults with Neurologic Conditions, 2018). Patient completed the Timed Up and Go test (TUG) in 36.03 seconds.  Geriatrics: need for further assessment of fall risk: >= 12 sec; Recurrent falls: > 15 sec; Vestibular Disorders fall risk: > 15 sec; Parkinson's Disease fall risk: > 16 sec ([email or web address], 2023). Continue POC as able.     OBJECTIVE IMPAIRMENTS: Abnormal gait, cardiopulmonary status limiting activity, decreased activity tolerance, decreased balance, decreased endurance, decreased knowledge of use of DME, difficulty walking, and decreased strength.   ACTIVITY LIMITATIONS: carrying, lifting, stairs, transfers, hygiene/grooming, locomotion level, and caring for others  PARTICIPATION LIMITATIONS: meal prep, cleaning, laundry, interpersonal relationship, driving, shopping, and community activity  PERSONAL FACTORS: Age, Fitness, Past/current experiences, Time since onset of injury/illness/exacerbation, Transportation, and 3+ comorbidities: see above are also affecting patient's functional outcome.   REHAB POTENTIAL: Fair chronicity  CLINICAL DECISION MAKING: Stable/uncomplicated  EVALUATION COMPLEXITY: Low  PLAN:  PT FREQUENCY: 2x/week  PT DURATION: 8 weeks  PLANNED INTERVENTIONS: [id number]- PT Re-evaluation, 97750- Physical Performance Testing, 97110-Therapeutic exercises, 97530- Therapeutic activity, [id number]- Neuromuscular re-education, 97535- Self Care, [id number]- Manual therapy, [id number]- Gait training, [phone]- Orthotic Initial, [phone]- Orthotic/Prosthetic subsequent, [phone]- Aquatic Therapy, [phone]- Electrical stimulation (manual), Patient/Family education, Balance training, Stair training, Vestibular training, Visual/preceptual remediation/compensation, and DME instructions  PLAN FOR NEXT SESSION: How is HEP?, functional strength/endurance - arm bike? Seated cardio circuits, step taps, simple dual tasking blaze pods?  Seated speed task, timed puzzle in standing?  Delon DELENA Pop, PT Delon DELENA Pop, PT, DPT, CBIS  05/19/2024, 4:04 PM

## 2024-05-20 ENCOUNTER — Other Ambulatory Visit: Payer: Self-pay

## 2024-05-20 ENCOUNTER — Emergency Department (HOSPITAL_COMMUNITY)

## 2024-05-20 ENCOUNTER — Inpatient Hospital Stay (HOSPITAL_COMMUNITY)
Admission: EM | Admit: 2024-05-20 | Discharge: 2024-05-29 | DRG: 441 | Disposition: A | Discharge status: Home Health | Attending: Family Medicine | Admitting: Family Medicine

## 2024-05-20 ENCOUNTER — Encounter (HOSPITAL_COMMUNITY): Payer: Self-pay

## 2024-05-20 DIAGNOSIS — R001 Bradycardia, unspecified: Secondary | ICD-10-CM | POA: Diagnosis not present

## 2024-05-20 DIAGNOSIS — K2990 Gastroduodenitis, unspecified, without bleeding: Secondary | ICD-10-CM | POA: Diagnosis present

## 2024-05-20 DIAGNOSIS — Z8673 Personal history of transient ischemic attack (TIA), and cerebral infarction without residual deficits: Secondary | ICD-10-CM

## 2024-05-20 DIAGNOSIS — E663 Overweight: Secondary | ICD-10-CM | POA: Diagnosis present

## 2024-05-20 DIAGNOSIS — R410 Disorientation, unspecified: Secondary | ICD-10-CM | POA: Diagnosis not present

## 2024-05-20 DIAGNOSIS — I10 Essential (primary) hypertension: Secondary | ICD-10-CM | POA: Diagnosis present

## 2024-05-20 DIAGNOSIS — K7682 Hepatic encephalopathy: Principal | ICD-10-CM | POA: Diagnosis present

## 2024-05-20 DIAGNOSIS — R932 Abnormal findings on diagnostic imaging of liver and biliary tract: Secondary | ICD-10-CM | POA: Diagnosis not present

## 2024-05-20 DIAGNOSIS — Z79890 Hormone replacement therapy (postmenopausal): Secondary | ICD-10-CM

## 2024-05-20 DIAGNOSIS — K449 Diaphragmatic hernia without obstruction or gangrene: Secondary | ICD-10-CM | POA: Diagnosis present

## 2024-05-20 DIAGNOSIS — E039 Hypothyroidism, unspecified: Secondary | ICD-10-CM | POA: Diagnosis not present

## 2024-05-20 DIAGNOSIS — E860 Dehydration: Secondary | ICD-10-CM | POA: Diagnosis present

## 2024-05-20 DIAGNOSIS — N19 Unspecified kidney failure: Secondary | ICD-10-CM | POA: Diagnosis present

## 2024-05-20 DIAGNOSIS — E870 Hyperosmolality and hypernatremia: Secondary | ICD-10-CM | POA: Diagnosis present

## 2024-05-20 DIAGNOSIS — M436 Torticollis: Secondary | ICD-10-CM | POA: Diagnosis present

## 2024-05-20 DIAGNOSIS — K766 Portal hypertension: Secondary | ICD-10-CM | POA: Diagnosis not present

## 2024-05-20 DIAGNOSIS — Z6829 Body mass index (BMI) 29.0-29.9, adult: Secondary | ICD-10-CM

## 2024-05-20 DIAGNOSIS — K7030 Alcoholic cirrhosis of liver without ascites: Secondary | ICD-10-CM | POA: Diagnosis present

## 2024-05-20 DIAGNOSIS — I6782 Cerebral ischemia: Secondary | ICD-10-CM | POA: Diagnosis not present

## 2024-05-20 DIAGNOSIS — I679 Cerebrovascular disease, unspecified: Secondary | ICD-10-CM | POA: Diagnosis not present

## 2024-05-20 DIAGNOSIS — N179 Acute kidney failure, unspecified: Secondary | ICD-10-CM | POA: Diagnosis not present

## 2024-05-20 DIAGNOSIS — K8020 Calculus of gallbladder without cholecystitis without obstruction: Secondary | ICD-10-CM | POA: Diagnosis not present

## 2024-05-20 DIAGNOSIS — E8809 Other disorders of plasma-protein metabolism, not elsewhere classified: Secondary | ICD-10-CM | POA: Diagnosis present

## 2024-05-20 DIAGNOSIS — E871 Hypo-osmolality and hyponatremia: Secondary | ICD-10-CM | POA: Diagnosis present

## 2024-05-20 DIAGNOSIS — K7460 Unspecified cirrhosis of liver: Secondary | ICD-10-CM | POA: Diagnosis not present

## 2024-05-20 DIAGNOSIS — I959 Hypotension, unspecified: Secondary | ICD-10-CM | POA: Diagnosis present

## 2024-05-20 DIAGNOSIS — I700 Atherosclerosis of aorta: Secondary | ICD-10-CM | POA: Diagnosis not present

## 2024-05-20 DIAGNOSIS — Z9689 Presence of other specified functional implants: Secondary | ICD-10-CM | POA: Diagnosis not present

## 2024-05-20 DIAGNOSIS — E876 Hypokalemia: Secondary | ICD-10-CM | POA: Diagnosis present

## 2024-05-20 DIAGNOSIS — D509 Iron deficiency anemia, unspecified: Secondary | ICD-10-CM | POA: Diagnosis present

## 2024-05-20 DIAGNOSIS — R4182 Altered mental status, unspecified: Secondary | ICD-10-CM | POA: Diagnosis present

## 2024-05-20 DIAGNOSIS — G9341 Metabolic encephalopathy: Secondary | ICD-10-CM | POA: Diagnosis not present

## 2024-05-20 DIAGNOSIS — Z85828 Personal history of other malignant neoplasm of skin: Secondary | ICD-10-CM

## 2024-05-20 DIAGNOSIS — K253 Acute gastric ulcer without hemorrhage or perforation: Secondary | ICD-10-CM

## 2024-05-20 DIAGNOSIS — D649 Anemia, unspecified: Secondary | ICD-10-CM | POA: Diagnosis not present

## 2024-05-20 DIAGNOSIS — R188 Other ascites: Secondary | ICD-10-CM | POA: Diagnosis not present

## 2024-05-20 DIAGNOSIS — K259 Gastric ulcer, unspecified as acute or chronic, without hemorrhage or perforation: Secondary | ICD-10-CM | POA: Diagnosis not present

## 2024-05-20 DIAGNOSIS — I480 Paroxysmal atrial fibrillation: Secondary | ICD-10-CM | POA: Diagnosis present

## 2024-05-20 DIAGNOSIS — R9082 White matter disease, unspecified: Secondary | ICD-10-CM | POA: Diagnosis not present

## 2024-05-20 DIAGNOSIS — I517 Cardiomegaly: Secondary | ICD-10-CM | POA: Diagnosis not present

## 2024-05-20 DIAGNOSIS — Z79899 Other long term (current) drug therapy: Secondary | ICD-10-CM

## 2024-05-20 DIAGNOSIS — D638 Anemia in other chronic diseases classified elsewhere: Secondary | ICD-10-CM | POA: Diagnosis not present

## 2024-05-20 DIAGNOSIS — K2950 Unspecified chronic gastritis without bleeding: Secondary | ICD-10-CM | POA: Diagnosis not present

## 2024-05-20 DIAGNOSIS — Z91041 Radiographic dye allergy status: Secondary | ICD-10-CM

## 2024-05-20 DIAGNOSIS — Z8249 Family history of ischemic heart disease and other diseases of the circulatory system: Secondary | ICD-10-CM

## 2024-05-20 DIAGNOSIS — R7989 Other specified abnormal findings of blood chemistry: Secondary | ICD-10-CM | POA: Diagnosis present

## 2024-05-20 DIAGNOSIS — Z7982 Long term (current) use of aspirin: Secondary | ICD-10-CM

## 2024-05-20 DIAGNOSIS — R195 Other fecal abnormalities: Secondary | ICD-10-CM | POA: Diagnosis not present

## 2024-05-20 DIAGNOSIS — K250 Acute gastric ulcer with hemorrhage: Secondary | ICD-10-CM | POA: Diagnosis present

## 2024-05-20 DIAGNOSIS — K2970 Gastritis, unspecified, without bleeding: Secondary | ICD-10-CM

## 2024-05-20 DIAGNOSIS — R6510 Systemic inflammatory response syndrome (SIRS) of non-infectious origin without acute organ dysfunction: Secondary | ICD-10-CM | POA: Diagnosis not present

## 2024-05-20 DIAGNOSIS — G936 Cerebral edema: Secondary | ICD-10-CM | POA: Diagnosis not present

## 2024-05-20 DIAGNOSIS — Z9221 Personal history of antineoplastic chemotherapy: Secondary | ICD-10-CM

## 2024-05-20 DIAGNOSIS — R569 Unspecified convulsions: Secondary | ICD-10-CM | POA: Diagnosis not present

## 2024-05-20 DIAGNOSIS — K7290 Hepatic failure, unspecified without coma: Secondary | ICD-10-CM | POA: Diagnosis not present

## 2024-05-20 DIAGNOSIS — R0989 Other specified symptoms and signs involving the circulatory and respiratory systems: Secondary | ICD-10-CM | POA: Diagnosis not present

## 2024-05-20 LAB — COMPREHENSIVE METABOLIC PANEL WITH GFR
ALT: 47 U/L — ABNORMAL HIGH (ref 0–44)
AST: 79 U/L — ABNORMAL HIGH (ref 15–41)
Albumin: 3.1 g/dL — ABNORMAL LOW (ref 3.5–5.0)
Alkaline Phosphatase: 161 U/L — ABNORMAL HIGH (ref 38–126)
Anion gap: 10 (ref 5–15)
BUN: 45 mg/dL — ABNORMAL HIGH (ref 8–23)
CO2: 25 mmol/L (ref 22–32)
Calcium: 9.3 mg/dL (ref 8.9–10.3)
Chloride: 102 mmol/L (ref 98–111)
Creatinine, Ser: 1.17 mg/dL — ABNORMAL HIGH (ref 0.44–1.00)
GFR, Estimated: 50 mL/min — ABNORMAL LOW (ref >60)
Glucose, Bld: 115 mg/dL — ABNORMAL HIGH (ref 70–99)
Potassium: 4.3 mmol/L (ref 3.5–5.1)
Sodium: 137 mmol/L (ref 135–145)
Total Bilirubin: 1.3 mg/dL — ABNORMAL HIGH (ref 0.0–1.2)
Total Protein: 7.2 g/dL (ref 6.5–8.1)

## 2024-05-20 LAB — URINALYSIS, ROUTINE W REFLEX MICROSCOPIC
Bilirubin Urine: NEGATIVE
Glucose, UA: NEGATIVE mg/dL
Hgb urine dipstick: NEGATIVE
Ketones, ur: NEGATIVE mg/dL
Leukocytes,Ua: NEGATIVE
Nitrite: NEGATIVE
Protein, ur: NEGATIVE mg/dL
Specific Gravity, Urine: 1.013 (ref 1.005–1.030)
pH: 7 (ref 5.0–8.0)

## 2024-05-20 LAB — CBC
HCT: 26.8 % — ABNORMAL LOW (ref 36.0–46.0)
Hemoglobin: 8.5 g/dL — ABNORMAL LOW (ref 12.0–15.0)
MCH: 32.7 pg (ref 26.0–34.0)
MCHC: 31.7 g/dL (ref 30.0–36.0)
MCV: 103.1 fL — ABNORMAL HIGH (ref 80.0–100.0)
Platelets: 133 10*3/uL — ABNORMAL LOW (ref 150–400)
RBC: 2.6 MIL/uL — ABNORMAL LOW (ref 3.87–5.11)
RDW: 17.6 % — ABNORMAL HIGH (ref 11.5–15.5)
WBC: 4.3 10*3/uL (ref 4.0–10.5)
nRBC: 0 % (ref 0.0–0.2)

## 2024-05-20 LAB — AMMONIA: Ammonia: 199 umol/L — ABNORMAL HIGH (ref 9–35)

## 2024-05-20 LAB — CBG MONITORING, ED: Glucose-Capillary: 106 mg/dL — ABNORMAL HIGH (ref 70–99)

## 2024-05-20 MED ORDER — ACETAMINOPHEN 650 MG RE SUPP: 650.0000 mg | Freq: Four times a day (QID) | RECTAL | Status: DC | PRN

## 2024-05-20 MED ORDER — METOPROLOL TARTRATE 5 MG/5ML IV SOLN: 2.5000 mg | INTRAVENOUS | Status: DC | PRN

## 2024-05-20 MED ORDER — ONDANSETRON HCL 4 MG/2ML IJ SOLN
4.0000 mg | Freq: Once | INTRAMUSCULAR | Status: AC
  Administered 2024-05-20: 4 mg via INTRAVENOUS
  Filled 2024-05-20: qty 2

## 2024-05-20 MED ORDER — ONDANSETRON HCL 4 MG/2ML IJ SOLN
4.0000 mg | Freq: Four times a day (QID) | INTRAMUSCULAR | Status: DC | PRN
  Administered 2024-05-21: 4 mg via INTRAVENOUS
  Filled 2024-05-20: qty 2

## 2024-05-20 MED ORDER — SODIUM CHLORIDE 0.9 % IV BOLUS
500.0000 mL | Freq: Once | INTRAVENOUS | Status: AC
  Administered 2024-05-20: 500 mL via INTRAVENOUS

## 2024-05-20 MED ORDER — LACTULOSE 10 GM/15ML PO SOLN
30.0000 g | Freq: Four times a day (QID) | ORAL | Status: DC
  Administered 2024-05-22 – 2024-05-27 (×17): 30 g via ORAL
  Filled 2024-05-20 (×3): qty 45
  Filled 2024-05-20: qty 60
  Filled 2024-05-20 (×14): qty 45

## 2024-05-20 MED ORDER — MELATONIN 3 MG PO TABS: 3.0000 mg | ORAL_TABLET | Freq: Every evening | ORAL | Status: DC | PRN

## 2024-05-20 MED ORDER — ACETAMINOPHEN 325 MG PO TABS
650.0000 mg | ORAL_TABLET | Freq: Four times a day (QID) | ORAL | Status: DC | PRN
Start: 2024-05-20 — End: 2024-05-29

## 2024-05-20 MED ORDER — LACTULOSE ENEMA
300.0000 mL | Freq: Once | ORAL | Status: AC
  Administered 2024-05-20: 300 mL via RECTAL
  Filled 2024-05-20: qty 300

## 2024-05-20 NOTE — ED Provider Notes (Signed)
 [place] EMERGENCY DEPARTMENT AT [facility] Provider Note  CSN: [id number] Arrival date & time: 05/20/24 1507  Chief Complaint(s) Altered Mental Status  HPI [name] is a 73 y.o. female history of cirrhosis, TIPS, recent stroke presenting with altered mental status.  Patient's husband reports that she has been had some confusion since yesterday and worsened this morning.  Has been having regular bowel movements, taking lactulose  as prescribed.  Mentation seems consistent with prior episodes of hepatic encephalopathy.  No recent fevers.  Husband denies any recent black stools or bloody stools, vomiting, head injury.  History limited due to altered mental status.   Past Medical History Past Medical History:  Diagnosis Date   Acute hepatic encephalopathy (HCC) 03/30/2024   Acute urinary retention 05/04/2022   Allergy 2006 ?   Contrast dye   Arthritis 2016 ??   Knees and thumb   Cancer (HCC)    cecum   Cataract 2021   Surgery scheduled July 2023   Colon cancer [facility]) 2003   Elevated liver function tests    Esophageal varices (HCC)    Heart murmur On file   Hemorrhage of gastrointestinal tract 05/04/2011   Hypertension 2021   Hypothyroidism    Iron deficiency anemia    Liver disease    chemotherapy complication, per pt, shunts placed to bypass liver   Malignant neoplasm of cecum (HCC)    Portal hypertension (HCC)    Skin cancer 2019   Splenomegaly    Patient Active Problem List   Diagnosis Date Noted   Acute metabolic encephalopathy 05/20/2024   Vision disturbance 05/11/2024   Patent foramen ovale 04/16/2024   Acute CVA (cerebrovascular accident) (HCC) 04/15/2024   Abnormal CBC 04/10/2024   Hyperkalemia 04/05/2024   Hypoglycemia 04/05/2024   Macrocytic anemia 04/05/2024   History of stroke 01/06/2024   Thrombocytopenia (HCC) 12/21/2023   Decreased GFR 12/10/2023   Preventative health care 09/09/2023   Lower extremity edema 08/10/2023   Cellulitis  of both lower extremities 08/04/2023   Altered mental status 08/04/2023   Bradycardia 05/07/2023   Protein-calorie malnutrition, severe (HCC) 02/26/2023   Weight loss 02/24/2023   Other constipation 02/24/2023   Generalized abdominal pain 02/24/2023   Hospital discharge follow-up 02/24/2023   Weakness 01/04/2023   Frequent UTI 05/18/2022   Anemia 05/18/2022   Tachycardia-bradycardia syndrome (HCC) 02/05/2022   Hypomagnesemia 02/04/2022   Hepatic encephalopathy (HCC) 02/03/2022   Basal cell carcinoma (BCC) 05/06/2021   Hypertension 05/06/2021   Decompensated liver cirrhosis with portal HTN and gastric varices 12/04/2020   Murmur, cardiac 03/19/2020   Hypothyroidism 06/30/2019   History of basal cell cancer 06/30/2019   Hx of colon cancer, stage III 11/30/2011   Portal hypertension (HCC) 01/23/2008   Home Medication(s) Prior to Admission medications   Medication Sig Start Date End Date Taking? Authorizing Provider  aspirin  EC 81 MG tablet Take 1 tablet (81 mg total) by mouth daily. Swallow whole. Patient taking differently: Take 81 mg by mouth in the morning. Swallow whole. 12/26/23  Yes Bryn Bernardino NOVAK, MD  CRANBERRY PO Take 1 tablet by mouth 2 (two) times daily.   Yes [provider]  furosemide  (LASIX ) 20 MG tablet Take 20-40 mg by mouth See admin instructions. Take blood pressure and depending on reading take 20mg  or 40mg  by mouth daily.   Yes [provider]  lactulose  (CHRONULAC ) 10 GM/15ML solution Take 45 mLs (30 g total) by mouth 3 (three) times daily. Patient taking differently: Take 60  g by mouth 3 (three) times daily. 03/13/23  Yes Pokhrel, Laxman, MD  levOCARNitine  (CARNITOR ) 330 MG tablet Take 330 mg by mouth 3 (three) times daily. 05/23/22  Yes [provider]  levothyroxine  (SYNTHROID ) 25 MCG tablet Take 25 mcg by mouth daily before breakfast.   Yes [provider]  liver oil-zinc  oxide (DESITIN) 40 % ointment Apply topically daily as  needed for irritation. 03/13/23  Yes Pokhrel, Laxman, MD  magnesium  oxide (MAG-OX) 400 MG tablet Take 1 tablet (400 mg total) by mouth daily. Patient taking differently: Take 400 mg by mouth in the morning. 04/05/24  Yes Rojelio Nest, DO  Multiple Vitamin (MULTI-VITAMIN) tablet Take 1 tablet by mouth in the morning.   Yes [provider]  polyethylene glycol (MIRALAX  / GLYCOLAX ) 17 g packet Take 17 g by mouth daily as needed for mild constipation. Patient taking differently: Take 17 g by mouth once a week. On Saturday 03/13/23  Yes Pokhrel, Vernal, MD  rifaximin  (XIFAXAN ) 550 MG TABS tablet Take 1 tablet (550 mg total) by mouth 2 (two) times daily. Patient taking differently: Take 550 mg by mouth as needed. Flareups 12/25/23  Yes Bryn Bernardino NOVAK, MD  spironolactone  (ALDACTONE ) 25 MG tablet Take 50 mg by mouth in the morning.   Yes [provider]  Accu-Chek Softclix Lancets lancets 3 (three) times daily. 04/05/24   [provider]  Blood Glucose Monitoring Suppl DEVI 1 each by Does not apply route in the morning, at noon, and at bedtime. May substitute to any manufacturer covered by patient's insurance. 04/05/24   Rojelio Nest, DO                                                                                                                                    Past Surgical History Past Surgical History:  Procedure Laterality Date   COLON SURGERY  2004   Cancer   COSMETIC SURGERY  2021   Skin cancer   ESOPHAGEAL VARICE LIGATION     EYE SURGERY     HEMICOLECTOMY  01/08/2003   IR RADIOLOGIST EVAL & MGMT  12/20/2020   IR RADIOLOGIST EVAL & MGMT  05/29/2021   LIVER SURGERY     shunts placed after chemo complication   SKIN FULL THICKNESS GRAFT N/A 09/12/2019   Procedure: debridement and FTSG to the nose from left upper arm;  Surgeon: Elisabeth Craig RAMAN, MD;  Location: [facility];  Service: Plastics;  Laterality: N/A;  2 hours, please   TIPS PROCEDURE      Family History Family History  Problem Relation Age of Onset   Arthritis Mother    Hearing loss Mother    Heart disease Mother    Hypertension Mother    Miscarriages / [place] Mother    Arthritis Father    Diabetes Father    Heart disease Father    Cancer Maternal Aunt    Breast  cancer Neg Hx    Colon cancer Neg Hx    Esophageal cancer Neg Hx    Pancreatic cancer Neg Hx    Stomach cancer Neg Hx    Stroke Neg Hx     Social History Social History   Tobacco Use   Smoking status: Never   Smokeless tobacco: Never   Tobacco comments:    Never smoked  Vaping Use   Vaping status: Never Used  Substance Use Topics   Alcohol  use: Never   Drug use: Never   Allergies Contrast media [iodinated contrast media]  Review of Systems Review of Systems  All other systems reviewed and are negative.   Physical Exam Vital Signs  I have reviewed the triage vital signs BP (!) 111/56   Pulse 92   Temp (!) 95.2 F (35.1 C) (Rectal)   Resp 11   Ht 5' 4 (1.626 m)   Wt 68 kg   SpO2 94%   BMI 25.75 kg/m  Physical Exam Vitals and nursing note reviewed.  Constitutional:      General: She is not in acute distress.    Appearance: She is well-developed.  HENT:     Head: Normocephalic and atraumatic.     Mouth/Throat:     Mouth: Mucous membranes are moist.   Eyes:     Pupils: Pupils are equal, round, and reactive to light.    Cardiovascular:     Rate and Rhythm: Normal rate and regular rhythm.     Heart sounds: No murmur heard. Pulmonary:     Effort: Pulmonary effort is normal. No respiratory distress.     Breath sounds: Normal breath sounds.  Abdominal:     General: Abdomen is flat.     Palpations: Abdomen is soft.     Tenderness: There is no abdominal tenderness.   Musculoskeletal:        General: No tenderness.     Right lower leg: No edema.     Left lower leg: No edema.   Skin:    General: Skin is warm and dry.   Neurological:     Mental Status: She is  alert.     Comments: Follows commands in all 4 extremities, asterixis present.  Patient oriented to self, knows she is in the hospital, does not know the year.  No facial droop.  Psychiatric:        Mood and Affect: Mood normal.        Behavior: Behavior normal.     ED Results and Treatments Labs (all labs ordered are listed, but only abnormal results are displayed) Labs Reviewed  COMPREHENSIVE METABOLIC PANEL WITH GFR - Abnormal; Notable for the following components:      Result Value   Glucose, Bld 115 (*)    BUN 45 (*)    Creatinine, Ser 1.17 (*)    Albumin  3.1 (*)    AST 79 (*)    ALT 47 (*)    Alkaline Phosphatase 161 (*)    Total Bilirubin 1.3 (*)    GFR, Estimated 50 (*)    All other components within normal limits  CBC - Abnormal; Notable for the following components:   RBC 2.60 (*)    Hemoglobin 8.5 (*)    HCT 26.8 (*)    MCV 103.1 (*)    RDW 17.6 (*)    Platelets 133 (*)    All other components within normal limits  URINALYSIS, ROUTINE W REFLEX MICROSCOPIC - Abnormal; Notable for the following  components:   APPearance CLOUDY (*)    All other components within normal limits  AMMONIA - Abnormal; Notable for the following components:   Ammonia 199 (*)    All other components within normal limits  CBG MONITORING, ED - Abnormal; Notable for the following components:   Glucose-Capillary 106 (*)    All other components within normal limits  CBC WITH DIFFERENTIAL/PLATELET  COMPREHENSIVE METABOLIC PANEL WITH GFR  MAGNESIUM   MAGNESIUM                                                                                                                           Radiology CT Head Wo Contrast Result Date: 05/20/2024 CLINICAL DATA:  Mental status change, unknown cause EXAM: CT HEAD WITHOUT CONTRAST TECHNIQUE: Contiguous axial images were obtained from the base of the skull through the vertex without intravenous contrast. RADIATION DOSE REDUCTION: This exam was performed  according to the departmental dose-optimization program which includes automated exposure control, adjustment of the mA and/or kV according to patient size and/or use of iterative reconstruction technique. COMPARISON:  MRI head April 23, 2024. FINDINGS: Brain: No evidence of acute infarction, hemorrhage, hydrocephalus, extra-axial collection or mass lesion/mass effect. Evolving left PCA territory infarct. Advanced white matter hypodensities, better characterized on recent MRI. Vascular: No hyperdense vessel. Skull: Normal. Negative for fracture or focal lesion. Sinuses/Orbits: Clear sinuses.  No acute findings. IMPRESSION: 1. Evolving left PCA territory infarct without evidence of new/interval acute abnormality. 2. Advanced white matter disease, further evaluated on recent MRI. Electronically Signed   By: Gilmore GORMAN Molt M.D.   On: 05/20/2024 20:23   DG Chest Port 1 View Result Date: 05/20/2024 CLINICAL DATA:  confusion EXAM: PORTABLE CHEST - 1 VIEW COMPARISON:  Mar 31, 2024 FINDINGS: Low lung volumes. No focal airspace consolidation, pleural effusion, or pneumothorax. Moderate cardiomegaly. Tortuous aorta with aortic atherosclerosis. No acute fracture or destructive lesions. Multilevel thoracic osteophytosis. Electronic structure overlying the midline chest. IMPRESSION: 1. Moderate cardiomegaly.  No pneumonia or pulmonary edema. 2. Electronic structure overlying the midline chest, likely external to the patient. Correlation with physical exam findings recommended. Electronically Signed   By: Rogelia Myers M.D.   On: 05/20/2024 17:32    Pertinent labs & imaging results that were available during my care of the patient were reviewed by me and considered in my medical decision making (see MDM for details).  Medications Ordered in ED Medications  acetaminophen  (TYLENOL ) tablet 650 mg (has no administration in time range)    Or  acetaminophen  (TYLENOL ) suppository 650 mg (has no administration in time  range)  melatonin tablet 3 mg (has no administration in time range)  ondansetron  (ZOFRAN ) injection 4 mg (has no administration in time range)  lactulose  (CHRONULAC ) 10 GM/15ML solution 30 g (has no administration in time range)  metoprolol tartrate (LOPRESSOR) injection 2.5 mg (has no administration in time range)  lactulose  (CHRONULAC ) enema 200 gm (300 mLs Rectal Given 05/20/24 1807)  ondansetron  (ZOFRAN ) injection 4 mg (  4 mg Intravenous Given 05/20/24 1656)  sodium chloride  0.9 % bolus 500 mL (0 mLs Intravenous Stopped 05/20/24 1924)                                                                                                                                     Procedures Procedures  (including critical care time)  Medical Decision Making / ED Course   MDM:  73 year old presenting to the emergency department with confusion, weakness.  Suspect hepatic encephalopathy.  Ammonia level is elevated.  Considered other process such as intracranial process, CT shows no acute process but evolving infarct that has been present since last week.  ECG does show A-fib which may be because of the patient's seizures.  Electrolytes without other causes of confusion such as hyponatremia.  Anemia seems stable, but husband denies any history of GI bleed which would possibly trigger hepatic encephalopathy.  Received lactulose  enema.  Given significant encephalopathy, markedly elevated ammonia, patient will need admission for further workup.  Discussed with hospitalist Dr. Marcene       Additional history obtained: -Additional history obtained from husband -External records from outside source obtained and reviewed including: Chart review including previous notes, labs, imaging, consultation notes including prior notes    Lab Tests: -I ordered, reviewed, and interpreted labs.   The pertinent results include:   Labs Reviewed  COMPREHENSIVE METABOLIC PANEL WITH GFR - Abnormal; Notable for the  following components:      Result Value   Glucose, Bld 115 (*)    BUN 45 (*)    Creatinine, Ser 1.17 (*)    Albumin  3.1 (*)    AST 79 (*)    ALT 47 (*)    Alkaline Phosphatase 161 (*)    Total Bilirubin 1.3 (*)    GFR, Estimated 50 (*)    All other components within normal limits  CBC - Abnormal; Notable for the following components:   RBC 2.60 (*)    Hemoglobin 8.5 (*)    HCT 26.8 (*)    MCV 103.1 (*)    RDW 17.6 (*)    Platelets 133 (*)    All other components within normal limits  URINALYSIS, ROUTINE W REFLEX MICROSCOPIC - Abnormal; Notable for the following components:   APPearance CLOUDY (*)    All other components within normal limits  AMMONIA - Abnormal; Notable for the following components:   Ammonia 199 (*)    All other components within normal limits  CBG MONITORING, ED - Abnormal; Notable for the following components:   Glucose-Capillary 106 (*)    All other components within normal limits  CBC WITH DIFFERENTIAL/PLATELET  COMPREHENSIVE METABOLIC PANEL WITH GFR  MAGNESIUM   MAGNESIUM     Notable for hyperammonemia   EKG   EKG Interpretation Date/Time:  Saturday May 20 2024 16:02:58 EDT Ventricular Rate:  90 PR Interval:    QRS Duration:  91 QT Interval:  408  QTC Calculation: 488 R Axis:   43  Text Interpretation: Atrial fibrillation Ventricular bigeminy RSR' in V1 or V2, probably normal variant Borderline prolonged QT interval Confirmed by Francesca Fallow ([id number]) on 05/20/2024 5:50:50 PM         Imaging Studies ordered: I ordered imaging studies including CXR, CT head  On my interpretation imaging demonstrates no acute process I independently visualized and interpreted imaging. I agree with the radiologist interpretation   Medicines ordered and prescription drug management: Meds ordered this encounter  Medications   lactulose  (CHRONULAC ) enema 200 gm   ondansetron  (ZOFRAN ) injection 4 mg   sodium chloride  0.9 % bolus 500 mL   OR Linked Order  Group    acetaminophen  (TYLENOL ) tablet 650 mg    acetaminophen  (TYLENOL ) suppository 650 mg   melatonin tablet 3 mg   ondansetron  (ZOFRAN ) injection 4 mg   lactulose  (CHRONULAC ) 10 GM/15ML solution 30 g   metoprolol tartrate (LOPRESSOR) injection 2.5 mg    -I have reviewed the patients home medicines and have made adjustments as needed   Reevaluation: After the interventions noted above, I reevaluated the patient and found that their symptoms have improved  Co morbidities that complicate the patient evaluation  Past Medical History:  Diagnosis Date   Acute hepatic encephalopathy (HCC) 03/30/2024   Acute urinary retention 05/04/2022   Allergy 2006 ?   Contrast dye   Arthritis 2016 ??   Knees and thumb   Cancer [facility])    cecum   Cataract 2021   Surgery scheduled July 2023   Colon cancer [facility]) 2003   Elevated liver function tests    Esophageal varices (HCC)    Heart murmur On file   Hemorrhage of gastrointestinal tract 05/04/2011   Hypertension 2021   Hypothyroidism    Iron deficiency anemia    Liver disease    chemotherapy complication, per pt, shunts placed to bypass liver   Malignant neoplasm of cecum (HCC)    Portal hypertension (HCC)    Skin cancer 2019   Splenomegaly       Dispostion: Disposition decision including need for hospitalization was considered, and patient admitted to the hospital.    Final Clinical Impression(s) / ED Diagnoses Final diagnoses:  Hepatic encephalopathy (HCC)     This chart was dictated using voice recognition software.  Despite best efforts to proofread,  errors can occur which can change the documentation meaning.    Francesca Fallow CROME, MD 05/20/24 2158

## 2024-05-20 NOTE — ED Notes (Signed)
 Patient back from CT scan. Placed back on bare hugger

## 2024-05-20 NOTE — ED Triage Notes (Signed)
 PT arrives via POV with her spouse. PT arrives Awake and alert but is confused and lethargic. PT has history of liver failure. Husband reports patient has been experiencing nausea, vomiting, fatigue, and confusion since yesterday. Husband states she did have a BM last night and this morning.

## 2024-05-20 NOTE — H&P (Incomplete)
 History and Physical      [name]:[id number] DOB: [birth year] DOA: 05/20/2024; DOS: 05/20/2024  PCP: [name], NP *** Patient coming from: home ***  I have personally briefly reviewed patient's old medical records in [facility]  Chief Complaint: ***  HPI: [name] is a 73 y.o. female with medical history significant for *** who is admitted to [facility] on 05/20/2024 with *** after presenting from home*** to [facility] ED complaining of ***.   ***        ***  ED Course:  Vital signs in the ED were notable for the following: ***  Labs were notable for the following: ***  Per my interpretation, EKG in ED demonstrated the following:  ***  Imaging in the ED, per corresponding formal radiology read, was notable for the following: ***  While in the ED, the following were administered: ***  Subsequently, the patient was admitted  ***  ***red   Review of Systems: As per HPI otherwise 10 point review of systems negative.   Past Medical History:  Diagnosis Date   Acute hepatic encephalopathy (HCC) 03/30/2024   Acute urinary retention 05/04/2022   Allergy 2006 ?   Contrast dye   Arthritis 2016 ??   Knees and thumb   Cancer (HCC)    cecum   Cataract 2021   Surgery scheduled July 2023   Colon cancer [facility]) 2003   Elevated liver function tests    Esophageal varices (HCC)    Heart murmur On file   Hemorrhage of gastrointestinal tract 05/04/2011   Hypertension 2021   Hypothyroidism    Iron deficiency anemia    Liver disease    chemotherapy complication, per pt, shunts placed to bypass liver   Malignant neoplasm of cecum (HCC)    Portal hypertension (HCC)    Skin cancer 2019   Splenomegaly     Past Surgical History:  Procedure Laterality Date   COLON SURGERY  2004   Cancer   COSMETIC SURGERY  2021   Skin cancer   ESOPHAGEAL VARICE LIGATION     EYE SURGERY     HEMICOLECTOMY  01/08/2003   IR RADIOLOGIST EVAL & MGMT  12/20/2020    IR RADIOLOGIST EVAL & MGMT  05/29/2021   LIVER SURGERY     shunts placed after chemo complication   SKIN FULL THICKNESS GRAFT N/A 09/12/2019   Procedure: debridement and FTSG to the nose from left upper arm;  Surgeon: Elisabeth Craig RAMAN, MD;  Location: [facility];  Service: Plastics;  Laterality: N/A;  2 hours, please   TIPS PROCEDURE      Social History:  reports that she has never smoked. She has never used smokeless tobacco. She reports that she does not drink alcohol  and does not use drugs.   Allergies  Allergen Reactions   Contrast Media [Iodinated Contrast Media] Hives    Family History  Problem Relation Age of Onset   Arthritis Mother    Hearing loss Mother    Heart disease Mother    Hypertension Mother    Miscarriages / [place] Mother    Arthritis Father    Diabetes Father    Heart disease Father    Cancer Maternal Aunt    Breast cancer Neg Hx    Colon cancer Neg Hx    Esophageal cancer Neg Hx    Pancreatic cancer Neg Hx    Stomach cancer Neg Hx    Stroke Neg Hx  Family history reviewed and not pertinent ***   Prior to Admission medications   Medication Sig Start Date End Date Taking? Authorizing Provider  aspirin  EC 81 MG tablet Take 1 tablet (81 mg total) by mouth daily. Swallow whole. Patient taking differently: Take 81 mg by mouth in the morning. Swallow whole. 12/26/23  Yes Bryn Bernardino NOVAK, MD  CRANBERRY PO Take 1 tablet by mouth 2 (two) times daily.   Yes [provider]  furosemide  (LASIX ) 20 MG tablet Take 20-40 mg by mouth See admin instructions. Take blood pressure and depending on reading take 20mg  or 40mg  by mouth daily.   Yes [provider]  lactulose  (CHRONULAC ) 10 GM/15ML solution Take 45 mLs (30 g total) by mouth 3 (three) times daily. Patient taking differently: Take 60 g by mouth 3 (three) times daily. 03/13/23  Yes Pokhrel, Laxman, MD  levOCARNitine  (CARNITOR ) 330 MG tablet Take 330 mg by mouth 3 (three)  times daily. 05/23/22  Yes [provider]  levothyroxine  (SYNTHROID ) 25 MCG tablet Take 25 mcg by mouth daily before breakfast.   Yes [provider]  liver oil-zinc  oxide (DESITIN) 40 % ointment Apply topically daily as needed for irritation. 03/13/23  Yes Pokhrel, Laxman, MD  magnesium  oxide (MAG-OX) 400 MG tablet Take 1 tablet (400 mg total) by mouth daily. Patient taking differently: Take 400 mg by mouth in the morning. 04/05/24  Yes Rojelio Nest, DO  Multiple Vitamin (MULTI-VITAMIN) tablet Take 1 tablet by mouth in the morning.   Yes [provider]  polyethylene glycol (MIRALAX  / GLYCOLAX ) 17 g packet Take 17 g by mouth daily as needed for mild constipation. Patient taking differently: Take 17 g by mouth once a week. On Saturday 03/13/23  Yes Pokhrel, Vernal, MD  rifaximin  (XIFAXAN ) 550 MG TABS tablet Take 1 tablet (550 mg total) by mouth 2 (two) times daily. Patient taking differently: Take 550 mg by mouth as needed. Flareups 12/25/23  Yes Bryn Bernardino NOVAK, MD  spironolactone  (ALDACTONE ) 25 MG tablet Take 50 mg by mouth in the morning.   Yes [provider]  Accu-Chek Softclix Lancets lancets 3 (three) times daily. 04/05/24   [provider]  Blood Glucose Monitoring Suppl DEVI 1 each by Does not apply route in the morning, at noon, and at bedtime. May substitute to any manufacturer covered by patient's insurance. 04/05/24   Rojelio Nest, DO     Objective    Physical Exam: Vitals:   05/20/24 1954 05/20/24 2015 05/20/24 2045 05/20/24 2130  BP:  (!) 106/59 (!) 99/54 (!) 111/56  Pulse: 100 (!) 120 92   Resp: 13 14 10 11   Temp: (!) 95.2 F (35.1 C)     TempSrc: Rectal     SpO2: 95% 94% 94%   Weight:      Height:        General: appears to be stated age; alert, oriented Skin: warm, dry, no rash Head:  AT/[place] Mouth:  Oral mucosa membranes appear moist, normal dentition Neck: supple; trachea midline Heart:  RRR; did not appreciate any  M/R/G Lungs: CTAB, did not appreciate any wheezes, rales, or rhonchi Abdomen: + BS; soft, ND, NT Vascular: 2+ pedal pulses b/l; 2+ radial pulses b/l Extremities: no peripheral edema, no muscle wasting Neuro: strength and sensation intact in upper and lower extremities b/l    *** Neuro: 5/5 strength of the proximal and distal flexors and extensors of the upper and lower extremities bilaterally; sensation intact in upper and lower  extremities b/l; cranial nerves II through XII grossly intact; no pronator drift; no evidence suggestive of slurred speech, dysarthria, or facial droop; Normal muscle tone. No tremors. *** Neuro: In the setting of the patient's current mental status and associated inability to follow instructions, unable to perform full neurologic exam at this time.  As such, assessment of strength, sensation, and cranial nerves is limited at this time. Patient noted to spontaneously move all 4 extremities. No tremors.  ***    Labs on Admission: I have personally reviewed following labs and imaging studies  CBC: Recent Labs  Lab 05/20/24 1543  WBC 4.3  HGB 8.5*  HCT 26.8*  MCV 103.1*  PLT 133*   Basic Metabolic Panel: Recent Labs  Lab 05/20/24 1543  NA 137  K 4.3  CL 102  CO2 25  GLUCOSE 115*  BUN 45*  CREATININE 1.17*  CALCIUM  9.3   GFR: Estimated Creatinine Clearance: 41.2 mL/min (A) (by C-G formula based on SCr of 1.17 mg/dL (H)). Liver Function Tests: Recent Labs  Lab 05/20/24 1543  AST 79*  ALT 47*  ALKPHOS 161*  BILITOT 1.3*  PROT 7.2  ALBUMIN  3.1*   No results for input(s): LIPASE, AMYLASE in the last 168 hours. Recent Labs  Lab 05/20/24 1543  AMMONIA 199*   Coagulation Profile: No results for input(s): INR, PROTIME in the last 168 hours. Cardiac Enzymes: No results for input(s): CKTOTAL, CKMB, CKMBINDEX, TROPONINI in the last 168 hours. BNP (last 3 results) Recent Labs    08/10/23 0946  PROBNP 45.0   HbA1C: No  results for input(s): HGBA1C in the last 72 hours. CBG: Recent Labs  Lab 05/20/24 1542  GLUCAP 106*   Lipid Profile: No results for input(s): CHOL, HDL, LDLCALC, TRIG, CHOLHDL, LDLDIRECT in the last 72 hours. Thyroid  Function Tests: No results for input(s): TSH, T4TOTAL, FREET4, T3FREE, THYROIDAB in the last 72 hours. Anemia Panel: No results for input(s): VITAMINB12, FOLATE, FERRITIN, TIBC, IRON, RETICCTPCT in the last 72 hours. Urine analysis:    Component Value Date/Time   COLORURINE YELLOW 05/20/2024 1806   APPEARANCEUR CLOUDY (A) 05/20/2024 1806   LABSPEC 1.013 05/20/2024 1806   PHURINE 7.0 05/20/2024 1806   GLUCOSEU NEGATIVE 05/20/2024 1806   GLUCOSEU NEGATIVE 08/04/2023 0916   HGBUR NEGATIVE 05/20/2024 1806   BILIRUBINUR NEGATIVE 05/20/2024 1806   BILIRUBINUR negative 12/06/2023 1117   BILIRUBINUR Negative 02/04/2023 0917   KETONESUR NEGATIVE 05/20/2024 1806   PROTEINUR NEGATIVE 05/20/2024 1806   UROBILINOGEN 0.2 12/06/2023 1117   UROBILINOGEN 0.2 08/04/2023 0916   NITRITE NEGATIVE 05/20/2024 1806   LEUKOCYTESUR NEGATIVE 05/20/2024 1806    Radiological Exams on Admission: CT Head Wo Contrast Result Date: 05/20/2024 CLINICAL DATA:  Mental status change, unknown cause EXAM: CT HEAD WITHOUT CONTRAST TECHNIQUE: Contiguous axial images were obtained from the base of the skull through the vertex without intravenous contrast. RADIATION DOSE REDUCTION: This exam was performed according to the departmental dose-optimization program which includes automated exposure control, adjustment of the mA and/or kV according to patient size and/or use of iterative reconstruction technique. COMPARISON:  MRI head April 23, 2024. FINDINGS: Brain: No evidence of acute infarction, hemorrhage, hydrocephalus, extra-axial collection or mass lesion/mass effect. Evolving left PCA territory infarct. Advanced white matter hypodensities, better characterized on recent MRI.  Vascular: No hyperdense vessel. Skull: Normal. Negative for fracture or focal lesion. Sinuses/Orbits: Clear sinuses.  No acute findings. IMPRESSION: 1. Evolving left PCA territory infarct without evidence of new/interval acute abnormality. 2. Advanced white matter disease,  further evaluated on recent MRI. Electronically Signed   By: Gilmore GORMAN Molt M.D.   On: 05/20/2024 20:23   DG Chest Port 1 View Result Date: 05/20/2024 CLINICAL DATA:  confusion EXAM: PORTABLE CHEST - 1 VIEW COMPARISON:  Mar 31, 2024 FINDINGS: Low lung volumes. No focal airspace consolidation, pleural effusion, or pneumothorax. Moderate cardiomegaly. Tortuous aorta with aortic atherosclerosis. No acute fracture or destructive lesions. Multilevel thoracic osteophytosis. Electronic structure overlying the midline chest. IMPRESSION: 1. Moderate cardiomegaly.  No pneumonia or pulmonary edema. 2. Electronic structure overlying the midline chest, likely external to the patient. Correlation with physical exam findings recommended. Electronically Signed   By: Rogelia Myers M.D.   On: 05/20/2024 17:32      Assessment/Plan    Active Problems:   Acute metabolic encephalopathy  ***        ***                  ***                  ***                  ***                  ***                 ***                  ***                  ***                  ***                  ***                  ***                  ***                 ***     DVT prophylaxis: SCD's ***  Code Status: Full code*** Family Communication: none*** Disposition Plan: Per Rounding Team Consults called: none***;  Admission status: ***    I SPENT GREATER THAN 75 *** MINUTES IN CLINICAL CARE TIME/MEDICAL DECISION-MAKING IN COMPLETING THIS  ADMISSION.     Eva NOVAK Aylanie Cubillos DO [facility] From 7PM - 7AM   05/20/2024, 9:43 PM   ***

## 2024-05-20 NOTE — ED Notes (Signed)
 Patient taken to CT.

## 2024-05-20 NOTE — ED Notes (Addendum)
 Patient taken off bedpan and new temperature obtained. Patient is still on barehugger with temperature 95.2 rectal when rechecked.

## 2024-05-20 NOTE — ED Notes (Signed)
 Turned the bare hugger up to the high temp setting, given patients temperature has not came up much since 1600 when placed on it.

## 2024-05-20 NOTE — ED Notes (Signed)
 Patient at this time is unable to answer questions. Husband at bedside states this is how she usually acts when she gets like this but normally can speak. Offered water with sponges in the mean time since was unable to have liquids due to not passing swallow screen. Husband was ok with that and thanked Clinical research associate.

## 2024-05-20 NOTE — ED Notes (Addendum)
 Patients temperature was 97.5 rectal. Bare hugger was taken off for the time being to see if patient maintains temperature. Husband states patients temperature usually stays around mid 97s on everyday normal basis for patient.

## 2024-05-21 ENCOUNTER — Encounter (HOSPITAL_COMMUNITY): Payer: Self-pay | Admitting: Internal Medicine

## 2024-05-21 DIAGNOSIS — N19 Unspecified kidney failure: Secondary | ICD-10-CM | POA: Diagnosis present

## 2024-05-21 DIAGNOSIS — R6510 Systemic inflammatory response syndrome (SIRS) of non-infectious origin without acute organ dysfunction: Secondary | ICD-10-CM | POA: Diagnosis present

## 2024-05-21 DIAGNOSIS — G9341 Metabolic encephalopathy: Secondary | ICD-10-CM | POA: Diagnosis not present

## 2024-05-21 DIAGNOSIS — I480 Paroxysmal atrial fibrillation: Secondary | ICD-10-CM | POA: Diagnosis present

## 2024-05-21 LAB — COMPREHENSIVE METABOLIC PANEL WITH GFR
ALT: 35 U/L (ref 0–44)
AST: 55 U/L — ABNORMAL HIGH (ref 15–41)
Albumin: 2.2 g/dL — ABNORMAL LOW (ref 3.5–5.0)
Alkaline Phosphatase: 114 U/L (ref 38–126)
Anion gap: 6 (ref 5–15)
BUN: 45 mg/dL — ABNORMAL HIGH (ref 8–23)
CO2: 26 mmol/L (ref 22–32)
Calcium: 8.3 mg/dL — ABNORMAL LOW (ref 8.9–10.3)
Chloride: 109 mmol/L (ref 98–111)
Creatinine, Ser: 1.27 mg/dL — ABNORMAL HIGH (ref 0.44–1.00)
GFR, Estimated: 45 mL/min — ABNORMAL LOW (ref >60)
Glucose, Bld: 81 mg/dL (ref 70–99)
Potassium: 5.1 mmol/L (ref 3.5–5.1)
Sodium: 141 mmol/L (ref 135–145)
Total Bilirubin: 0.9 mg/dL (ref 0.0–1.2)
Total Protein: 5.1 g/dL — ABNORMAL LOW (ref 6.5–8.1)

## 2024-05-21 LAB — BLOOD GAS, VENOUS
Acid-Base Excess: 5.8 mmol/L — ABNORMAL HIGH (ref 0.0–2.0)
Bicarbonate: 29.8 mmol/L — ABNORMAL HIGH (ref 20.0–28.0)
O2 Saturation: 89.6 %
Patient temperature: 37
pCO2, Ven: 40 mmHg — ABNORMAL LOW (ref 44–60)
pH, Ven: 7.48 — ABNORMAL HIGH (ref 7.25–7.43)
pO2, Ven: 54 mmHg — ABNORMAL HIGH (ref 32–45)

## 2024-05-21 LAB — CBC WITH DIFFERENTIAL/PLATELET
Abs Immature Granulocytes: 0.02 10*3/uL (ref 0.00–0.07)
Basophils Absolute: 0 10*3/uL (ref 0.0–0.1)
Basophils Relative: 0 %
Eosinophils Absolute: 0.1 10*3/uL (ref 0.0–0.5)
Eosinophils Relative: 2 %
HCT: 20.6 % — ABNORMAL LOW (ref 36.0–46.0)
HCT: 26.3 % — ABNORMAL LOW (ref 36.0–46.0)
Hemoglobin: 6.4 g/dL — CL (ref 12.0–15.0)
Hemoglobin: 8.1 g/dL — ABNORMAL LOW (ref 12.0–15.0)
Immature Granulocytes: 0 %
Lymphocytes Relative: 14 %
Lymphs Abs: 0.7 10*3/uL (ref 0.7–4.0)
MCH: 32.7 pg (ref 26.0–34.0)
MCH: 32.3 pg (ref 26.0–34.0)
MCHC: 31.1 g/dL (ref 30.0–36.0)
MCHC: 30.8 g/dL (ref 30.0–36.0)
MCV: 105.1 fL — ABNORMAL HIGH (ref 80.0–100.0)
MCV: 104.8 fL — ABNORMAL HIGH (ref 80.0–100.0)
Monocytes Absolute: 0.4 10*3/uL (ref 0.1–1.0)
Monocytes Relative: 7 %
Neutro Abs: 4 10*3/uL (ref 1.7–7.7)
Neutrophils Relative %: 77 %
Platelets: 136 10*3/uL — ABNORMAL LOW (ref 150–400)
Platelets: 143 10*3/uL — ABNORMAL LOW (ref 150–400)
RBC: 1.96 MIL/uL — ABNORMAL LOW (ref 3.87–5.11)
RBC: 2.51 MIL/uL — ABNORMAL LOW (ref 3.87–5.11)
RDW: 18 % — ABNORMAL HIGH (ref 11.5–15.5)
RDW: 22 % — ABNORMAL HIGH (ref 11.5–15.5)
Smear Review: NONE SEEN
WBC: 5.2 10*3/uL (ref 4.0–10.5)
WBC: 4.1 10*3/uL (ref 4.0–10.5)
nRBC: 0 % (ref 0.0–0.2)
nRBC: 0 % (ref 0.0–0.2)

## 2024-05-21 LAB — RAPID URINE DRUG SCREEN, HOSP PERFORMED
Amphetamines: NOT DETECTED
Barbiturates: NOT DETECTED
Benzodiazepines: NOT DETECTED
Cocaine: NOT DETECTED
Opiates: NOT DETECTED
Tetrahydrocannabinol: NOT DETECTED

## 2024-05-21 LAB — PROCALCITONIN: Procalcitonin: 0.16 ng/mL

## 2024-05-21 LAB — SODIUM, URINE, RANDOM: Sodium, Ur: 39 mmol/L

## 2024-05-21 LAB — PREPARE RBC (CROSSMATCH)

## 2024-05-21 LAB — VITAMIN B12: Vitamin B-12: 1018 pg/mL — ABNORMAL HIGH (ref 180–914)

## 2024-05-21 LAB — MRSA NEXT GEN BY PCR, NASAL: MRSA by PCR Next Gen: NOT DETECTED

## 2024-05-21 LAB — PROTIME-INR
INR: 1.4 — ABNORMAL HIGH (ref 0.8–1.2)
Prothrombin Time: 18.2 s — ABNORMAL HIGH (ref 11.4–15.2)

## 2024-05-21 LAB — TSH: TSH: 1.675 u[IU]/mL (ref 0.350–4.500)

## 2024-05-21 LAB — CREATININE, URINE, RANDOM: Creatinine, Urine: 59 mg/dL

## 2024-05-21 LAB — MAGNESIUM: Magnesium: 3.2 mg/dL — ABNORMAL HIGH (ref 1.7–2.4)

## 2024-05-21 MED ORDER — SODIUM CHLORIDE 0.9 % IV SOLN: INTRAVENOUS | Status: AC

## 2024-05-21 MED ORDER — CARMEX CLASSIC LIP BALM EX OINT
TOPICAL_OINTMENT | CUTANEOUS | Status: DC | PRN
  Filled 2024-05-21: qty 10

## 2024-05-21 MED ORDER — RIFAXIMIN 550 MG PO TABS
550.0000 mg | ORAL_TABLET | Freq: Two times a day (BID) | ORAL | Status: DC
  Administered 2024-05-22 – 2024-05-29 (×13): 550 mg via ORAL
  Filled 2024-05-21 (×15): qty 1

## 2024-05-21 MED ORDER — ORAL CARE MOUTH RINSE
15.0000 mL | OROMUCOSAL | Status: DC | PRN
Start: 2024-05-21 — End: 2024-05-23

## 2024-05-21 MED ORDER — LACTULOSE ENEMA
300.0000 mL | ORAL | Status: AC
  Administered 2024-05-21: 300 mL via RECTAL
  Filled 2024-05-21 (×2): qty 300

## 2024-05-21 MED ORDER — LEVOTHYROXINE SODIUM 25 MCG PO TABS
25.0000 ug | ORAL_TABLET | Freq: Every day | ORAL | Status: DC
  Administered 2024-05-23 – 2024-05-29 (×6): 25 ug via ORAL
  Filled 2024-05-21 (×7): qty 1

## 2024-05-21 MED ORDER — CHLORHEXIDINE GLUCONATE CLOTH 2 % EX PADS
6.0000 | MEDICATED_PAD | Freq: Every day | CUTANEOUS | Status: DC
  Administered 2024-05-21 – 2024-05-28 (×8): 6 via TOPICAL

## 2024-05-21 MED ORDER — LACTULOSE ENEMA
300.0000 mL | Freq: Once | ORAL | Status: AC
  Administered 2024-05-21: 300 mL via RECTAL
  Filled 2024-05-21 (×2): qty 300

## 2024-05-21 MED ORDER — SODIUM CHLORIDE 0.9% IV SOLUTION: Freq: Once | INTRAVENOUS | Status: DC

## 2024-05-21 MED ORDER — ORAL CARE MOUTH RINSE
15.0000 mL | OROMUCOSAL | Status: DC
  Administered 2024-05-21 – 2024-05-23 (×8): 15 mL via OROMUCOSAL

## 2024-05-21 MED ORDER — ASPIRIN 81 MG PO TBEC
81.0000 mg | DELAYED_RELEASE_TABLET | Freq: Every day | ORAL | Status: DC
  Administered 2024-05-23 – 2024-05-26 (×3): 81 mg via ORAL
  Filled 2024-05-21 (×4): qty 1

## 2024-05-21 NOTE — ED Notes (Signed)
 Patient had big bowel movement in the bed. Linen was changed and cleaned with soap and water along with CHG wipes. Patient was placed in new brief with new linen.

## 2024-05-21 NOTE — Progress Notes (Signed)
 Hospitalist Update:  I was notified by the patient's RN that the patient's updated hemoglobin this morning is 6.4, now slightly outside of her baseline hemoglobin range of 7-11 following nearly 2 L of IV fluid over the last day.  Vital signs remain unchanged relative to earlier in the tonight's shift, with HR's in the 80s, systolic blood pressures in the low 100s mmHg.   I subsequently placed orders for transfusion of 1 unit PRBC over 3 hours, as well as order for updated H&H to be checked following the completion of transfusion of this 1 unit PRBC.    Eva Pore, DO Hospitalist

## 2024-05-21 NOTE — ED Notes (Signed)
 Patient resting in room at this time. Husband at bedside.

## 2024-05-21 NOTE — Progress Notes (Signed)
 PROGRESS NOTE  [name] FMW:[id number] DOB: Jul 15, 1951 DOA: 05/20/2024 PCP: [name], NP   LOS: 1 day   Brief Narrative / Interim history: 73 year old female with liver cirrhosis, prior EV's and GI bleed, status post TIPS, PAF not on anticoagulation, hypothyroidism, anemia of chronic disease who comes into the hospital with increased confusion.  She is being brought by her husband.  She has had prior hospitalizations for hepatic encephalopathy.  Husband reports that she has done this before, and has progressively gotten worse over the last week.  She was found to have an ammonia level almost 200.  She has no white count, no fever, clinically without signs of having an infection or GI bleed at this point  Subjective / 24h Interval events: She will open her eyes on my evaluation but otherwise does not communicate and is lethargic.  Assesement and Plan: Principal Problem:   Acute metabolic encephalopathy Active Problems:   AKI (acute kidney injury) (HCC)   Cirrhosis (HCC)   Acquired hypothyroidism   Anemia of chronic disease   SIRS (systemic inflammatory response syndrome) (HCC)   Acute prerenal azotemia   Paroxysmal atrial fibrillation (HCC)   Principal problem Hepatic encephalopathy, underlying decompensated liver cirrhosis -she has had progressive confusion over the past week, currently presentation consistent with hepatic encephalopathy. - Unable to be awake enough to take oral lactulose , provide enema.  If she does not wake up with enema may need an NG tube to administer oral lactulose . - No evidence of an acute infectious process, no evidence of GI bleed  Active problems Paroxysmal A-fib-rate controlled, not on anticoagulation due to prior GI bleed  Anemia of liver disease-no evidence of GI bleed, hemoglobin dropped this morning to 6.4, likely dilutional as she received IV fluids.  She will be transfused unit of packed red blood cells.  Continue to  monitor.  Hypothyroidism-continue Synthroid .  TSH normal  AKI -prior creatinine earlier this month 0.7.  Creatinine 1.2 this morning.  Continue to closely monitor.    Scheduled Meds:  sodium chloride    Intravenous Once   aspirin  EC  81 mg Oral Daily   lactulose   30 g Oral Q6H   lactulose   300 mL Rectal STAT   levothyroxine   25 mcg Oral Q0600   rifaximin   550 mg Oral BID   Continuous Infusions: PRN Meds:.acetaminophen  **OR** acetaminophen , melatonin, metoprolol tartrate, ondansetron  (ZOFRAN ) IV  Current Outpatient Medications  Medication Instructions   Accu-Chek Softclix Lancets lancets 3 times daily   aspirin  EC 81 mg, Oral, Daily, Swallow whole.   Blood Glucose Monitoring Suppl DEVI 1 each, Does not apply, 3 times daily, May substitute to any manufacturer covered by patient's insurance.   CRANBERRY PO 1 tablet, 2 times daily   furosemide  (LASIX ) 20-40 mg, See admin instructions   lactulose  (CHRONULAC ) 30 g, Oral, 3 times daily   levOCARNitine  (CARNITOR ) 330 mg, 3 times daily   levothyroxine  (SYNTHROID ) 25 mcg, Daily before breakfast   liver oil-zinc  oxide (DESITIN) 40 % ointment Topical, Daily PRN   magnesium  oxide (MAG-OX) 400 mg, Oral, Daily   Multiple Vitamin (MULTI-VITAMIN) tablet 1 tablet, Every morning   polyethylene glycol (MIRALAX  / GLYCOLAX ) 17 g, Oral, Daily PRN   rifaximin  (XIFAXAN ) 550 mg, Oral, 2 times daily   spironolactone  (ALDACTONE ) 50 mg, Every morning    Diet Orders (From admission, onward)     Start     Ordered   05/20/24 2134  Diet regular Room service appropriate? Yes; Fluid consistency: Thin  Diet effective now       Question Answer Comment  Room service appropriate? Yes   Fluid consistency: Thin      05/20/24 2133            DVT prophylaxis: SCDs Start: 05/20/24 2134   Lab Results  Component Value Date   PLT 136 (L) 05/21/2024      Code Status: Full Code  Family Communication: Husband at bedside  Status is: Inpatient Remains  inpatient appropriate because: Severity of illness   Level of care: Progressive  Consultants:  None  Objective: Vitals:   05/21/24 0618 05/21/24 0745 05/21/24 0813 05/21/24 0829  BP:  (!) 105/46 (!) 95/47 (!) 104/53  Pulse:  77 77 78  Resp:  15 16 12   Temp: 97.7 F (36.5 C)  98.1 F (36.7 C) (!) 97.5 F (36.4 C)  TempSrc: Rectal  Oral Axillary  SpO2:  94% 96% 96%  Weight:      Height:        Intake/Output Summary (Last 24 hours) at 05/21/2024 0935 Last data filed at 05/21/2024 9192 Gross per 24 hour  Intake 500 ml  Output --  Net 500 ml   Wt Readings from Last 3 Encounters:  05/20/24 68 kg  05/11/24 69.5 kg  04/27/24 70.6 kg    Examination:  Constitutional: Comfortable, lethargic Eyes: no scleral icterus ENMT: Mucous membranes are moist.  Neck: normal, supple Respiratory: clear to auscultation bilaterally, no wheezing, no crackles.  Cardiovascular: Regular rate and rhythm, no murmurs / rubs / gallops.  Abdomen: non distended, no tenderness. Bowel sounds positive.  Musculoskeletal: no clubbing / cyanosis.   Data Reviewed: I have independently reviewed following labs and imaging studies   CBC Recent Labs  Lab 05/20/24 1543 05/21/24 0425  WBC 4.3 5.2  HGB 8.5* 6.4*  HCT 26.8* 20.6*  PLT 133* 136*  MCV 103.1* 105.1*  MCH 32.7 32.7  MCHC 31.7 31.1  RDW 17.6* 18.0*  LYMPHSABS  --  0.7  MONOABS  --  0.4  EOSABS  --  0.1  BASOSABS  --  0.0    Recent Labs  Lab 05/20/24 1543 05/21/24 0425  NA 137 141  K 4.3 5.1  CL 102 109  CO2 25 26  GLUCOSE 115* 81  BUN 45* 45*  CREATININE 1.17* 1.27*  CALCIUM  9.3 8.3*  AST 79* 55*  ALT 47* 35  ALKPHOS 161* 114  BILITOT 1.3* 0.9  ALBUMIN  3.1* 2.2*  MG  --  3.2*  PROCALCITON  --  0.16  INR  --  1.4*  TSH  --  1.675  AMMONIA 199*  --     ------------------------------------------------------------------------------------------------------------------ No results for input(s): CHOL, HDL, LDLCALC,  TRIG, CHOLHDL, LDLDIRECT in the last 72 hours.  Lab Results  Component Value Date   HGBA1C 3.7 (L) 04/16/2024   ------------------------------------------------------------------------------------------------------------------ Recent Labs    05/21/24 0425  TSH 1.675    Cardiac Enzymes No results for input(s): CKMB, TROPONINI, MYOGLOBIN in the last 168 hours.  Invalid input(s): CK ------------------------------------------------------------------------------------------------------------------    Component Value Date/Time   BNP 79.0 08/17/2020 0135    CBG: Recent Labs  Lab 05/20/24 1542  GLUCAP 106*    No results found for this or any previous visit (from the past 240 hours).   Radiology Studies: CT Head Wo Contrast Result Date: 05/20/2024 CLINICAL DATA:  Mental status change, unknown cause EXAM: CT HEAD WITHOUT CONTRAST TECHNIQUE: Contiguous axial images were obtained from the base of the skull through  the vertex without intravenous contrast. RADIATION DOSE REDUCTION: This exam was performed according to the departmental dose-optimization program which includes automated exposure control, adjustment of the mA and/or kV according to patient size and/or use of iterative reconstruction technique. COMPARISON:  MRI head April 23, 2024. FINDINGS: Brain: No evidence of acute infarction, hemorrhage, hydrocephalus, extra-axial collection or mass lesion/mass effect. Evolving left PCA territory infarct. Advanced white matter hypodensities, better characterized on recent MRI. Vascular: No hyperdense vessel. Skull: Normal. Negative for fracture or focal lesion. Sinuses/Orbits: Clear sinuses.  No acute findings. IMPRESSION: 1. Evolving left PCA territory infarct without evidence of new/interval acute abnormality. 2. Advanced white matter disease, further evaluated on recent MRI. Electronically Signed   By: Gilmore GORMAN Molt M.D.   On: 05/20/2024 20:23   DG Chest Port 1 View Result  Date: 05/20/2024 CLINICAL DATA:  confusion EXAM: PORTABLE CHEST - 1 VIEW COMPARISON:  Mar 31, 2024 FINDINGS: Low lung volumes. No focal airspace consolidation, pleural effusion, or pneumothorax. Moderate cardiomegaly. Tortuous aorta with aortic atherosclerosis. No acute fracture or destructive lesions. Multilevel thoracic osteophytosis. Electronic structure overlying the midline chest. IMPRESSION: 1. Moderate cardiomegaly.  No pneumonia or pulmonary edema. 2. Electronic structure overlying the midline chest, likely external to the patient. Correlation with physical exam findings recommended. Electronically Signed   By: Rogelia Myers M.D.   On: 05/20/2024 17:32     Nilda Fendt, MD, PhD [facility]  Between 7 am - 7 pm I am available, please contact me via Amion (for emergencies) or Securechat (non urgent messages)  Between 7 pm - 7 am I am not available, please contact night coverage MD/APP via Amion

## 2024-05-21 NOTE — ED Notes (Signed)
 Called lab for urine add ons that was placed and stated needed a new sample sent down for those testing. Will attempt to get urine sample from patient.

## 2024-05-22 ENCOUNTER — Ambulatory Visit: Admitting: Physical Therapy

## 2024-05-22 ENCOUNTER — Encounter: Payer: Self-pay | Admitting: Physical Therapy

## 2024-05-22 ENCOUNTER — Telehealth: Payer: Self-pay | Admitting: *Deleted

## 2024-05-22 DIAGNOSIS — G9341 Metabolic encephalopathy: Secondary | ICD-10-CM | POA: Diagnosis not present

## 2024-05-22 DIAGNOSIS — M6281 Muscle weakness (generalized): Secondary | ICD-10-CM

## 2024-05-22 DIAGNOSIS — R262 Difficulty in walking, not elsewhere classified: Secondary | ICD-10-CM

## 2024-05-22 DIAGNOSIS — R2681 Unsteadiness on feet: Secondary | ICD-10-CM

## 2024-05-22 DIAGNOSIS — R278 Other lack of coordination: Secondary | ICD-10-CM

## 2024-05-22 LAB — BPAM RBC
Blood Product Expiration Date: 202507062359
ISSUE DATE / TIME: 202506290750
Unit Type and Rh: 5100

## 2024-05-22 LAB — TYPE AND SCREEN
ABO/RH(D): O POS
Antibody Screen: NEGATIVE
Unit division: 0

## 2024-05-22 LAB — CBC
HCT: 26.2 % — ABNORMAL LOW (ref 36.0–46.0)
Hemoglobin: 8 g/dL — ABNORMAL LOW (ref 12.0–15.0)
MCH: 31.9 pg (ref 26.0–34.0)
MCHC: 30.5 g/dL (ref 30.0–36.0)
MCV: 104.4 fL — ABNORMAL HIGH (ref 80.0–100.0)
Platelets: 127 10*3/uL — ABNORMAL LOW (ref 150–400)
RBC: 2.51 MIL/uL — ABNORMAL LOW (ref 3.87–5.11)
RDW: 22 % — ABNORMAL HIGH (ref 11.5–15.5)
WBC: 6.3 10*3/uL (ref 4.0–10.5)
nRBC: 0 % (ref 0.0–0.2)

## 2024-05-22 LAB — COMPREHENSIVE METABOLIC PANEL WITH GFR
ALT: 35 U/L (ref 0–44)
AST: 53 U/L — ABNORMAL HIGH (ref 15–41)
Albumin: 2.3 g/dL — ABNORMAL LOW (ref 3.5–5.0)
Alkaline Phosphatase: 112 U/L (ref 38–126)
Anion gap: 8 (ref 5–15)
BUN: 40 mg/dL — ABNORMAL HIGH (ref 8–23)
CO2: 21 mmol/L — ABNORMAL LOW (ref 22–32)
Calcium: 8.6 mg/dL — ABNORMAL LOW (ref 8.9–10.3)
Chloride: 117 mmol/L — ABNORMAL HIGH (ref 98–111)
Creatinine, Ser: 1.28 mg/dL — ABNORMAL HIGH (ref 0.44–1.00)
GFR, Estimated: 45 mL/min — ABNORMAL LOW (ref >60)
Glucose, Bld: 85 mg/dL (ref 70–99)
Potassium: 3.8 mmol/L (ref 3.5–5.1)
Sodium: 146 mmol/L — ABNORMAL HIGH (ref 135–145)
Total Bilirubin: 1.6 mg/dL — ABNORMAL HIGH (ref 0.0–1.2)
Total Protein: 5.5 g/dL — ABNORMAL LOW (ref 6.5–8.1)

## 2024-05-22 LAB — MAGNESIUM: Magnesium: 2.6 mg/dL — ABNORMAL HIGH (ref 1.7–2.4)

## 2024-05-22 LAB — GLUCOSE, CAPILLARY: Glucose-Capillary: 135 mg/dL — ABNORMAL HIGH (ref 70–99)

## 2024-05-22 MED ORDER — SODIUM CHLORIDE 0.45 % IV SOLN: INTRAVENOUS | Status: AC

## 2024-05-22 MED ORDER — LACTULOSE ENEMA
300.0000 mL | Freq: Two times a day (BID) | ORAL | Status: DC
  Administered 2024-05-22 (×2): 300 mL via RECTAL
  Filled 2024-05-22 (×3): qty 300

## 2024-05-22 NOTE — Plan of Care (Signed)

## 2024-05-22 NOTE — TOC Initial Note (Signed)
 Transition of Care [facility]) - Initial/Assessment Note    Patient Details  Name: [name] MRN: [id number] Date of Birth: October 14, 1951  Transition of Care [facility]) CM/SW Contact:    Jon ONEIDA Anon, RN Phone Number: 05/22/2024, 4:01 PM  Clinical Narrative:                 Pt is from home with spouse. Pt POC is spouse Edee Nifong at [phone]. Pt has a cane and walker in the home. No DME, HH, or SDOH needs at this time. TOC will follow for any new needs or recommendations.   Expected Discharge Plan: Home/Self Care Barriers to Discharge: Continued Medical Work up   Patient Goals and CMS Choice Patient states their goals for this hospitalization and ongoing recovery are:: Home CMS Medicare.gov Compare Post Acute Care list provided to:: Other (Comment Required) (NA) Choice offered to / list presented to : NA [place] ownership interest in [facility].provided to:: Parent NA    Expected Discharge Plan and Services In-house Referral: NA Discharge Planning Services: CM Consult Post Acute Care Choice: NA Living arrangements for the past 2 months: Single Family Home                 DME Arranged: N/A DME Agency: NA       HH Arranged: NA HH Agency: NA        Prior Living Arrangements/Services Living arrangements for the past 2 months: Single Family Home Lives with:: Spouse Patient language and need for interpreter reviewed:: Yes Do you feel safe going back to the place where you live?: Yes      Need for Family Participation in Patient Care: Yes (Comment) Care giver support system in place?: Yes (comment) Current home services: DME Criminal Activity/Legal Involvement Pertinent to Current Situation/Hospitalization: No - Comment as needed  Activities of Daily Living   ADL Screening (condition at time of admission) Independently performs ADLs?: Yes (appropriate for developmental age) Is the patient deaf or have difficulty hearing?: No Does the patient have  difficulty seeing, even when wearing glasses/contacts?: No  Permission Sought/Granted Permission sought to share information with : Family Supports Permission granted to share information with : Yes, Verbal Permission Granted  Share Information with NAME: [place], Dorsie [name] (Spouse)  [phone]           Emotional Assessment Appearance:: Other (Comment Required (UTA)   Affect (typically observed): Unable to Assess Orientation: : Oriented to Self Alcohol  / Substance Use: Not Applicable Psych Involvement: No (comment)  Admission diagnosis:  Hepatic encephalopathy (HCC) [K76.82] Acute metabolic encephalopathy [G93.41] Patient Active Problem List   Diagnosis Date Noted   SIRS (systemic inflammatory response syndrome) (HCC) 05/21/2024   Acute prerenal azotemia 05/21/2024   Paroxysmal atrial fibrillation (HCC) 05/21/2024   Acute metabolic encephalopathy 05/20/2024   Vision disturbance 05/11/2024   Patent foramen ovale 04/16/2024   Acute CVA (cerebrovascular accident) (HCC) 04/15/2024   Abnormal CBC 04/10/2024   Hyperkalemia 04/05/2024   Hypoglycemia 04/05/2024   Macrocytic anemia 04/05/2024   History of stroke 01/06/2024   Thrombocytopenia (HCC) 12/21/2023   Decreased GFR 12/10/2023   Preventative health care 09/09/2023   Lower extremity edema 08/10/2023   Cellulitis of both lower extremities 08/04/2023   Altered mental status 08/04/2023   Bradycardia 05/07/2023   Protein-calorie malnutrition, severe (HCC) 02/26/2023   Weight loss 02/24/2023   Other constipation 02/24/2023   Generalized abdominal pain 02/24/2023   Hospital discharge follow-up 02/24/2023   Weakness 01/04/2023   Frequent  UTI 05/18/2022   Anemia of chronic disease 05/18/2022   AKI (acute kidney injury) (HCC) 04/30/2022   Tachycardia-bradycardia syndrome (HCC) 02/05/2022   Hypomagnesemia 02/04/2022   Hepatic encephalopathy (HCC) 02/03/2022   Basal cell carcinoma (BCC) 05/06/2021   Hypertension  05/06/2021   Cirrhosis (HCC) 12/04/2020   Murmur, cardiac 03/19/2020   Acquired hypothyroidism 06/30/2019   History of basal cell cancer 06/30/2019   Hx of colon cancer, stage III 11/30/2011   Portal hypertension (HCC) 01/23/2008   PCP:  [name], NP Pharmacy:   [facility] - [place], [place] - [address] [place] [address] Phone: [phone] Fax: [phone]     Social Drivers of Health (SDOH) Social History: SDOH Screenings   Food Insecurity: No Food Insecurity (05/21/2024)  Housing: Low Risk  (05/21/2024)  Transportation Needs: No Transportation Needs (05/21/2024)  Utilities: Not At Risk (05/21/2024)  Alcohol  Screen: Low Risk  (03/27/2024)  Depression (PHQ2-9): Low Risk  (05/11/2024)  Financial Resource Strain: Low Risk  (05/10/2024)  Physical Activity: Insufficiently Active (05/10/2024)  Social Connections: Socially Integrated (05/10/2024)  Recent Concern: Social Connections - Moderately Isolated (03/30/2024)  Stress: No Stress Concern Present (05/10/2024)  Tobacco Use: Low Risk  (05/22/2024)   SDOH Interventions:     Readmission Risk Interventions    05/22/2024    3:58 PM 03/31/2024    2:51 PM 12/23/2023    2:25 PM  Readmission Risk Prevention Plan  Transportation Screening Complete Complete Complete  PCP or Specialist Appt within 5-7 Days   Complete  PCP or Specialist Appt within 3-5 Days  Complete   Home Care Screening   Complete  Medication Review (RN CM)   Complete  HRI or Home Care Consult  Complete   Social Work Consult for Recovery Care Planning/Counseling  Complete   Palliative Care Screening  Not Applicable   Medication Review Oceanographer) Complete Complete   HRI or Home Care Consult Complete    SW Recovery Care/Counseling Consult Complete    Palliative Care Screening Not Applicable    Skilled Nursing Facility Not Applicable

## 2024-05-22 NOTE — Progress Notes (Signed)
 PROGRESS NOTE  [name] FMW:[id number] DOB: 08-18-1951 DOA: 05/20/2024 PCP: [name], NP   LOS: 2 days   Brief Narrative / Interim history: 73 year old female with liver cirrhosis, prior EV's and GI bleed, status post TIPS, PAF not on anticoagulation, hypothyroidism, anemia of chronic disease who comes into the hospital with increased confusion.  She is being brought by her husband.  She has had prior hospitalizations for hepatic encephalopathy.  Husband reports that she has done this before, and has progressively gotten worse over the last week.  She was found to have an ammonia level almost 200.  She has no white count, no fever, clinically without signs of having an infection or GI bleed at this point  Subjective / 24h Interval events: She will open her eyes on my evaluation but otherwise does not communicate and is lethargic.  Assesement and Plan: Principal Problem:   Acute metabolic encephalopathy Active Problems:   AKI (acute kidney injury) (HCC)   Cirrhosis (HCC)   Acquired hypothyroidism   Hepatic encephalopathy (HCC)   Anemia of chronic disease   SIRS (systemic inflammatory response syndrome) (HCC)   Acute prerenal azotemia   Paroxysmal atrial fibrillation (HCC)   Principal problem Hepatic encephalopathy, underlying decompensated liver cirrhosis -she has had progressive confusion over the past week, currently presentation consistent with hepatic encephalopathy. - Unable to be awake enough to take oral lactulose , provide enema scheduled twice daily -Husband does not want NG tube so we will continue enemas - No evidence of an acute infectious process, no evidence of GI bleed  Active problems Paroxysmal A-fib-rate controlled, not on anticoagulation due to prior GI bleed  Anemia of liver disease-no evidence of GI bleed -Given a unit of PRBCs  Hypothyroidism-continue Synthroid .  TSH normal  AKI -prior creatinine earlier this month 0.7 -IVF while not  eating  Hypernatremia -IVF    Scheduled Meds:  sodium chloride    Intravenous Once   aspirin  EC  81 mg Oral Daily   Chlorhexidine  Gluconate Cloth  6 each Topical Daily   lactulose   30 g Oral Q6H   lactulose   300 mL Rectal BID   levothyroxine   25 mcg Oral Q0600   mouth rinse  15 mL Mouth Rinse 4 times per day   rifaximin   550 mg Oral BID   Continuous Infusions:  sodium chloride  75 mL/hr at 05/22/24 0903   PRN Meds:.acetaminophen  **OR** acetaminophen , lip balm, melatonin, metoprolol tartrate, ondansetron  (ZOFRAN ) IV, mouth rinse  Current Outpatient Medications  Medication Instructions   Accu-Chek Softclix Lancets lancets 3 times daily   aspirin  EC 81 mg, Oral, Daily, Swallow whole.   Blood Glucose Monitoring Suppl DEVI 1 each, Does not apply, 3 times daily, May substitute to any manufacturer covered by patient's insurance.   CRANBERRY PO 1 tablet, 2 times daily   furosemide  (LASIX ) 20-40 mg, See admin instructions   lactulose  (CHRONULAC ) 30 g, Oral, 3 times daily   levOCARNitine  (CARNITOR ) 330 mg, 3 times daily   levothyroxine  (SYNTHROID ) 25 mcg, Daily before breakfast   liver oil-zinc  oxide (DESITIN) 40 % ointment Topical, Daily PRN   magnesium  oxide (MAG-OX) 400 mg, Oral, Daily   Multiple Vitamin (MULTI-VITAMIN) tablet 1 tablet, Every morning   polyethylene glycol (MIRALAX  / GLYCOLAX ) 17 g, Oral, Daily PRN   rifaximin  (XIFAXAN ) 550 mg, Oral, 2 times daily   spironolactone  (ALDACTONE ) 50 mg, Every morning    Diet Orders (From admission, onward)     Start     Ordered  05/20/24 2134  Diet regular Room service appropriate? Yes; Fluid consistency: Thin  Diet effective now       Question Answer Comment  Room service appropriate? Yes   Fluid consistency: Thin      05/20/24 2133            DVT prophylaxis: SCDs Start: 05/20/24 2134   Lab Results  Component Value Date   PLT 127 (L) 05/22/2024      Code Status: Full Code  Family Communication: Husband at  bedside  Status is: Inpatient Remains inpatient appropriate because: Severity of illness   Level of care: Stepdown  Consultants:  None  Objective: Vitals:   05/22/24 0700 05/22/24 0800 05/22/24 0900 05/22/24 1000  BP: 121/67 (!) 97/48 110/70 (!) 125/58  Pulse: 81 70 82 83  Resp: 13 10 16  (!) 21  Temp:  (!) 97.5 F (36.4 C)    TempSrc:  Axillary    SpO2: 99% 96% 98% 97%  Weight:      Height:        Intake/Output Summary (Last 24 hours) at 05/22/2024 1239 Last data filed at 05/22/2024 1142 Gross per 24 hour  Intake 700 ml  Output 2900 ml  Net -2200 ml   Wt Readings from Last 3 Encounters:  05/21/24 67.3 kg  05/11/24 69.5 kg  04/27/24 70.6 kg    Examination:   General: Appearance:     Overweight female in no acute distress     Lungs:     Clear to auscultation bilaterally, respirations unlabored  Heart:    Normal heart rate.   MS:   All extremities are intact.   Neurologic:  Lethargic now-- husband says more awake today then yesterday    Data Reviewed: I have independently reviewed following labs and imaging studies   CBC Recent Labs  Lab 05/20/24 1543 05/21/24 0425 05/21/24 2025 05/22/24 0255  WBC 4.3 5.2 4.1 6.3  HGB 8.5* 6.4* 8.1* 8.0*  HCT 26.8* 20.6* 26.3* 26.2*  PLT 133* 136* 143* 127*  MCV 103.1* 105.1* 104.8* 104.4*  MCH 32.7 32.7 32.3 31.9  MCHC 31.7 31.1 30.8 30.5  RDW 17.6* 18.0* 22.0* 22.0*  LYMPHSABS  --  0.7 PENDING  --   MONOABS  --  0.4 PENDING  --   EOSABS  --  0.1 PENDING  --   BASOSABS  --  0.0 PENDING  --     Recent Labs  Lab 05/20/24 1543 05/21/24 0425 05/22/24 0255  NA 137 141 146*  K 4.3 5.1 3.8  CL 102 109 117*  CO2 25 26 21*  GLUCOSE 115* 81 85  BUN 45* 45* 40*  CREATININE 1.17* 1.27* 1.28*  CALCIUM  9.3 8.3* 8.6*  AST 79* 55* 53*  ALT 47* 35 35  ALKPHOS 161* 114 112  BILITOT 1.3* 0.9 1.6*  ALBUMIN  3.1* 2.2* 2.3*  MG  --  3.2* 2.6*  PROCALCITON  --  0.16  --   INR  --  1.4*  --   TSH  --  1.675  --    AMMONIA 199*  --   --     ------------------------------------------------------------------------------------------------------------------ No results for input(s): CHOL, HDL, LDLCALC, TRIG, CHOLHDL, LDLDIRECT in the last 72 hours.  Lab Results  Component Value Date   HGBA1C 3.7 (L) 04/16/2024   ------------------------------------------------------------------------------------------------------------------ Recent Labs    05/21/24 0425  TSH 1.675    Cardiac Enzymes No results for input(s): CKMB, TROPONINI, MYOGLOBIN in the last 168 hours.  Invalid input(s): CK ------------------------------------------------------------------------------------------------------------------  Component Value Date/Time   BNP 79.0 08/17/2020 0135    CBG: Recent Labs  Lab 05/20/24 1542  GLUCAP 106*    Recent Results (from the past 240 hours)  MRSA Next Gen by PCR, Nasal     Status: None   Collection Time: 05/21/24  1:27 PM   Specimen: Nasal Mucosa; Nasal Swab  Result Value Ref Range Status   MRSA by PCR Next Gen NOT DETECTED NOT DETECTED Final    Comment: (NOTE) The GeneXpert MRSA Assay (FDA approved for NASAL specimens only), is one component of a comprehensive MRSA colonization surveillance program. It is not intended to diagnose MRSA infection nor to guide or monitor treatment for MRSA infections. Test performance is not FDA approved in patients less than 71 years old. Performed at [facility], [address] [place], [place] [address]      Radiology Studies: No results found.    Harlene Bowl DO [facility]  Between 7 am - 7 pm I am available, please contact me via Amion (for emergencies) or Securechat (non urgent messages)  Between 7 pm - 7 am I am not available, please contact night coverage MD/APP via Amion

## 2024-05-22 NOTE — Therapy (Signed)
 [facility] [address] [place], [place], [address] Phone: [phone]   Fax:  [phone]  Patient Details - DISCHARGE SUMMARY Name: [name] MRN: [id number] Date of Birth: 10-22-1951 Referring Provider:  No ref. provider found  Encounter Date: 05/22/2024  Spoke to patient's husband regarding status change and hospitalization.  He is agreeable to discharge from OPPT and pursuing HHPT for the time being.  He will pursue referral.  Daved [name], PT, DPT 05/22/2024, 8:38 AM  [facility] [address] [place], [place], [address] Phone: [phone]   Fax:  [phone]

## 2024-05-22 NOTE — Telephone Encounter (Signed)
 Monitor recording signed by Dr. Jeffrie.   Placed in medical records folder to be scanned into the pts chart.  Will send this message to the pts Primary Cardiologist Dr. Santo and Jackee Alberts NP as a general FYI.

## 2024-05-22 NOTE — Telephone Encounter (Signed)
   Cardiac Monitor Alert  Date of alert:  05/22/2024   Patient Name: [name]  DOB: [birth year]  MRN: [id number]   [facility] Cardiologist: Stanly DELENA Leavens, MD  [facility] EP:  None    Monitor Information: Cardiac Event Monitor [[employer]]  Reason:  CVA Ordering provider:  Dr. Leavens {  Alert Atrial Fibrillation with a rate of 93 bpm--looks like first alert for this rhythm, but not clearly noted in her chart.    Did note that the pt is currently admitted to the hospital for hepatic encephalopathy.  Pt admitted to [id number].   Will have DOD Dr. Jeffrie review and sign this, so that we can get recording scanned into the pts chart, for inpatient care team to have for reference.      OtherBETHA Gladis Porter CHRISTELLA, LPN  3/69/7974 0:63 AM

## 2024-05-23 ENCOUNTER — Other Ambulatory Visit: Payer: Self-pay | Admitting: "Endocrinology

## 2024-05-23 DIAGNOSIS — G9341 Metabolic encephalopathy: Secondary | ICD-10-CM | POA: Diagnosis not present

## 2024-05-23 DIAGNOSIS — E2740 Unspecified adrenocortical insufficiency: Secondary | ICD-10-CM

## 2024-05-23 LAB — CBC
HCT: 24.8 % — ABNORMAL LOW (ref 36.0–46.0)
Hemoglobin: 7.7 g/dL — ABNORMAL LOW (ref 12.0–15.0)
MCH: 32.2 pg (ref 26.0–34.0)
MCHC: 31 g/dL (ref 30.0–36.0)
MCV: 103.8 fL — ABNORMAL HIGH (ref 80.0–100.0)
Platelets: 126 10*3/uL — ABNORMAL LOW (ref 150–400)
RBC: 2.39 MIL/uL — ABNORMAL LOW (ref 3.87–5.11)
RDW: 21.3 % — ABNORMAL HIGH (ref 11.5–15.5)
WBC: 5.1 10*3/uL (ref 4.0–10.5)
nRBC: 0 % (ref 0.0–0.2)

## 2024-05-23 LAB — BASIC METABOLIC PANEL WITH GFR
Anion gap: 6 (ref 5–15)
BUN: 27 mg/dL — ABNORMAL HIGH (ref 8–23)
CO2: 21 mmol/L — ABNORMAL LOW (ref 22–32)
Calcium: 8.3 mg/dL — ABNORMAL LOW (ref 8.9–10.3)
Chloride: 113 mmol/L — ABNORMAL HIGH (ref 98–111)
Creatinine, Ser: 1.02 mg/dL — ABNORMAL HIGH (ref 0.44–1.00)
GFR, Estimated: 58 mL/min — ABNORMAL LOW (ref >60)
Glucose, Bld: 72 mg/dL (ref 70–99)
Potassium: 3.6 mmol/L (ref 3.5–5.1)
Sodium: 140 mmol/L (ref 135–145)

## 2024-05-23 MED ORDER — ENSURE PLUS HIGH PROTEIN PO LIQD
237.0000 mL | Freq: Two times a day (BID) | ORAL | Status: DC
  Administered 2024-05-25 – 2024-05-28 (×3): 237 mL via ORAL

## 2024-05-23 MED ORDER — LACTULOSE ENEMA: 300.0000 mL | Freq: Two times a day (BID) | ORAL | Status: DC | PRN

## 2024-05-23 MED ORDER — SODIUM CHLORIDE 0.45 % IV SOLN: INTRAVENOUS | Status: DC

## 2024-05-23 NOTE — Plan of Care (Signed)
  Problem: Clinical Measurements: Goal: Respiratory complications will improve Outcome: Progressing Goal: Cardiovascular complication will be avoided Outcome: Progressing   Problem: Nutrition: Goal: Adequate nutrition will be maintained Outcome: Progressing   Problem: Elimination: Goal: Will not experience complications related to bowel motility Outcome: Progressing Goal: Will not experience complications related to urinary retention Outcome: Progressing

## 2024-05-23 NOTE — Progress Notes (Signed)
 PROGRESS NOTE  [name] FMW:[id number] DOB: [birth year] DOA: 05/20/2024 PCP: [name], NP   LOS: 3 days   Brief Narrative / Interim history: 73 year old female with liver cirrhosis, prior EV's and GI bleed, status post TIPS, PAF not on anticoagulation, hypothyroidism, anemia of chronic disease who comes into the hospital with increased confusion.  She is being brought by her husband.  She has had prior hospitalizations for hepatic encephalopathy.  Husband reports that she has done this before, and has progressively gotten worse over the last week.  She was found to have an ammonia level almost 200.  She has no white count, no fever, clinically without signs of having an infection or GI bleed at this point.  Subjective / 24h Interval events: More awake than yesterday but husband says she is hallucinating   Assesement and Plan: Principal Problem:   Acute metabolic encephalopathy Active Problems:   AKI (acute kidney injury) (HCC)   Cirrhosis (HCC)   Acquired hypothyroidism   Hepatic encephalopathy (HCC)   Anemia of chronic disease   SIRS (systemic inflammatory response syndrome) (HCC)   Acute prerenal azotemia   Paroxysmal atrial fibrillation (HCC)   Hepatic encephalopathy, underlying decompensated liver cirrhosis -she has had progressive confusion over the past week, currently presentation consistent with hepatic encephalopathy. - Initially was unable to be awake enough to take oral lactulose , so enema scheduled twice daily-- currently more awake and able to take her PO meds - No evidence of an acute infectious process, no evidence of GI bleed  Paroxysmal A-fib-rate controlled, not on anticoagulation due to prior GI bleed  Anemia of liver disease-no evidence of GI bleed -Given a unit of PRBCs 6/29  Hypothyroidism-continue Synthroid .  TSH normal  AKI -prior creatinine earlier this month 0.7 -IVF given when not eating much  Hypernatremia -improved on  IVF    Scheduled Meds:  sodium chloride    Intravenous Once   aspirin  EC  81 mg Oral Daily   Chlorhexidine  Gluconate Cloth  6 each Topical Daily   feeding supplement  237 mL Oral BID BM   lactulose   30 g Oral Q6H   levothyroxine   25 mcg Oral Q0600   mouth rinse  15 mL Mouth Rinse 4 times per day   rifaximin   550 mg Oral BID   Continuous Infusions:   PRN Meds:.acetaminophen  **OR** acetaminophen , lactulose , lip balm, melatonin, metoprolol tartrate, ondansetron  (ZOFRAN ) IV  Current Outpatient Medications  Medication Instructions   Accu-Chek Softclix Lancets lancets 3 times daily   aspirin  EC 81 mg, Oral, Daily, Swallow whole.   Blood Glucose Monitoring Suppl DEVI 1 each, Does not apply, 3 times daily, May substitute to any manufacturer covered by patient's insurance.   CRANBERRY PO 1 tablet, 2 times daily   furosemide  (LASIX ) 20-40 mg, See admin instructions   lactulose  (CHRONULAC ) 30 g, Oral, 3 times daily   levOCARNitine  (CARNITOR ) 330 mg, 3 times daily   levothyroxine  (SYNTHROID ) 25 mcg, Daily before breakfast   liver oil-zinc  oxide (DESITIN) 40 % ointment Topical, Daily PRN   magnesium  oxide (MAG-OX) 400 mg, Oral, Daily   Multiple Vitamin (MULTI-VITAMIN) tablet 1 tablet, Every morning   polyethylene glycol (MIRALAX  / GLYCOLAX ) 17 g, Oral, Daily PRN   rifaximin  (XIFAXAN ) 550 mg, Oral, 2 times daily   spironolactone  (ALDACTONE ) 50 mg, Every morning    Diet Orders (From admission, onward)     Start     Ordered   05/20/24 2134  Diet regular Room service appropriate? Yes; Fluid  consistency: Thin  Diet effective now       Question Answer Comment  Room service appropriate? Yes   Fluid consistency: Thin      05/20/24 2133            DVT prophylaxis: SCDs Start: 05/20/24 2134   Lab Results  Component Value Date   PLT 126 (L) 05/23/2024      Code Status: Full Code  Family Communication: Husband at bedside  Status is: Inpatient Remains inpatient appropriate  because: Severity of illness   Level of care: Stepdown  Consultants:  None  Objective: Vitals:   05/23/24 0600 05/23/24 0700 05/23/24 0800 05/23/24 0900  BP: (!) 98/45 131/61 (!) 146/68 (!) 133/57  Pulse: 61 73 80 74  Resp: (!) 21 (!) 24 18 12   Temp:   97.7 F (36.5 C)   TempSrc:   Axillary   SpO2: 100% 97% 98% 97%  Weight:      Height:        Intake/Output Summary (Last 24 hours) at 05/23/2024 1028 Last data filed at 05/23/2024 0800 Gross per 24 hour  Intake 2871.18 ml  Output 1540 ml  Net 1331.18 ml   Wt Readings from Last 3 Encounters:  05/21/24 67.3 kg  05/11/24 69.5 kg  04/27/24 70.6 kg    Examination:   General: Appearance:     Overweight female in no acute distress     Lungs:     respirations unlabored  Heart:    Normal heart rate.   MS:   All extremities are intact.   Neurologic:  Awake and interactive but appear weak and needs prompting to speak    Data Reviewed: I have independently reviewed following labs and imaging studies   CBC Recent Labs  Lab 05/20/24 1543 05/21/24 0425 05/21/24 2025 05/22/24 0255 05/23/24 0302  WBC 4.3 5.2 4.1 6.3 5.1  HGB 8.5* 6.4* 8.1* 8.0* 7.7*  HCT 26.8* 20.6* 26.3* 26.2* 24.8*  PLT 133* 136* 143* 127* 126*  MCV 103.1* 105.1* 104.8* 104.4* 103.8*  MCH 32.7 32.7 32.3 31.9 32.2  MCHC 31.7 31.1 30.8 30.5 31.0  RDW 17.6* 18.0* 22.0* 22.0* 21.3*  LYMPHSABS  --  0.7 PENDING  --   --   MONOABS  --  0.4 PENDING  --   --   EOSABS  --  0.1 PENDING  --   --   BASOSABS  --  0.0 PENDING  --   --     Recent Labs  Lab 05/20/24 1543 05/21/24 0425 05/22/24 0255 05/23/24 0302  NA 137 141 146* 140  K 4.3 5.1 3.8 3.6  CL 102 109 117* 113*  CO2 25 26 21* 21*  GLUCOSE 115* 81 85 72  BUN 45* 45* 40* 27*  CREATININE 1.17* 1.27* 1.28* 1.02*  CALCIUM  9.3 8.3* 8.6* 8.3*  AST 79* 55* 53*  --   ALT 47* 35 35  --   ALKPHOS 161* 114 112  --   BILITOT 1.3* 0.9 1.6*  --   ALBUMIN  3.1* 2.2* 2.3*  --   MG  --  3.2* 2.6*  --    PROCALCITON  --  0.16  --   --   INR  --  1.4*  --   --   TSH  --  1.675  --   --   AMMONIA 199*  --   --   --     ------------------------------------------------------------------------------------------------------------------ No results for input(s): CHOL, HDL, LDLCALC, TRIG, CHOLHDL, LDLDIRECT in the  last 72 hours.  Lab Results  Component Value Date   HGBA1C 3.7 (L) 04/16/2024   ------------------------------------------------------------------------------------------------------------------ Recent Labs    05/21/24 0425  TSH 1.675    Cardiac Enzymes No results for input(s): CKMB, TROPONINI, MYOGLOBIN in the last 168 hours.  Invalid input(s): CK ------------------------------------------------------------------------------------------------------------------    Component Value Date/Time   BNP 79.0 08/17/2020 0135    CBG: Recent Labs  Lab 05/20/24 1542 05/22/24 2126  GLUCAP 106* 135*    Recent Results (from the past 240 hours)  MRSA Next Gen by PCR, Nasal     Status: None   Collection Time: 05/21/24  1:27 PM   Specimen: Nasal Mucosa; Nasal Swab  Result Value Ref Range Status   MRSA by PCR Next Gen NOT DETECTED NOT DETECTED Final    Comment: (NOTE) The GeneXpert MRSA Assay (FDA approved for NASAL specimens only), is one component of a comprehensive MRSA colonization surveillance program. It is not intended to diagnose MRSA infection nor to guide or monitor treatment for MRSA infections. Test performance is not FDA approved in patients less than 44 years old. Performed at [facility], [address], [place], [place] [address]      Radiology Studies: No results found.    Harlene Bowl DO [facility]  Between 7 am - 7 pm I am available, please contact me via Amion (for emergencies) or Securechat (non urgent messages)  Between 7 pm - 7 am I am not available, please contact night coverage MD/APP via  Amion

## 2024-05-24 ENCOUNTER — Ambulatory Visit (HOSPITAL_BASED_OUTPATIENT_CLINIC_OR_DEPARTMENT_OTHER)

## 2024-05-24 ENCOUNTER — Ambulatory Visit

## 2024-05-24 ENCOUNTER — Inpatient Hospital Stay (HOSPITAL_COMMUNITY): Admit: 2024-05-24 | Discharge: 2024-05-24 | Disposition: A | Discharge status: Home / Self Care | Attending: Neurology

## 2024-05-24 ENCOUNTER — Inpatient Hospital Stay (HOSPITAL_COMMUNITY)

## 2024-05-24 DIAGNOSIS — R4182 Altered mental status, unspecified: Secondary | ICD-10-CM | POA: Diagnosis not present

## 2024-05-24 DIAGNOSIS — K7682 Hepatic encephalopathy: Secondary | ICD-10-CM

## 2024-05-24 DIAGNOSIS — G9341 Metabolic encephalopathy: Secondary | ICD-10-CM | POA: Diagnosis not present

## 2024-05-24 DIAGNOSIS — R569 Unspecified convulsions: Secondary | ICD-10-CM | POA: Diagnosis not present

## 2024-05-24 LAB — COMPREHENSIVE METABOLIC PANEL WITH GFR
ALT: 32 U/L (ref 0–44)
AST: 48 U/L — ABNORMAL HIGH (ref 15–41)
Albumin: 2.3 g/dL — ABNORMAL LOW (ref 3.5–5.0)
Alkaline Phosphatase: 123 U/L (ref 38–126)
Anion gap: 7 (ref 5–15)
BUN: 19 mg/dL (ref 8–23)
CO2: 20 mmol/L — ABNORMAL LOW (ref 22–32)
Calcium: 8.3 mg/dL — ABNORMAL LOW (ref 8.9–10.3)
Chloride: 115 mmol/L — ABNORMAL HIGH (ref 98–111)
Creatinine, Ser: 0.92 mg/dL (ref 0.44–1.00)
GFR, Estimated: >60 mL/min (ref >60)
Glucose, Bld: 84 mg/dL (ref 70–99)
Potassium: 3.9 mmol/L (ref 3.5–5.1)
Sodium: 142 mmol/L (ref 135–145)
Total Bilirubin: 1.3 mg/dL — ABNORMAL HIGH (ref 0.0–1.2)
Total Protein: 5.5 g/dL — ABNORMAL LOW (ref 6.5–8.1)

## 2024-05-24 LAB — CBC
HCT: 25.2 % — ABNORMAL LOW (ref 36.0–46.0)
Hemoglobin: 7.8 g/dL — ABNORMAL LOW (ref 12.0–15.0)
MCH: 32 pg (ref 26.0–34.0)
MCHC: 31 g/dL (ref 30.0–36.0)
MCV: 103.3 fL — ABNORMAL HIGH (ref 80.0–100.0)
Platelets: 137 10*3/uL — ABNORMAL LOW (ref 150–400)
RBC: 2.44 MIL/uL — ABNORMAL LOW (ref 3.87–5.11)
RDW: 20.6 % — ABNORMAL HIGH (ref 11.5–15.5)
WBC: 4.4 10*3/uL (ref 4.0–10.5)
nRBC: 0 % (ref 0.0–0.2)

## 2024-05-24 LAB — AMMONIA: Ammonia: 61 umol/L — ABNORMAL HIGH (ref 9–35)

## 2024-05-24 LAB — GLUCOSE, CAPILLARY: Glucose-Capillary: 82 mg/dL (ref 70–99)

## 2024-05-24 MED ORDER — TIZANIDINE HCL 4 MG PO TABS
2.0000 mg | ORAL_TABLET | Freq: Three times a day (TID) | ORAL | Status: DC
  Administered 2024-05-24 – 2024-05-25 (×2): 2 mg via ORAL
  Filled 2024-05-24 (×4): qty 1

## 2024-05-24 MED ORDER — LACTULOSE ENEMA
300.0000 mL | Freq: Two times a day (BID) | ORAL | Status: DC
  Administered 2024-05-24 – 2024-05-27 (×3): 300 mL via RECTAL
  Filled 2024-05-24 (×6): qty 300

## 2024-05-24 MED ORDER — DEXTROSE-SODIUM CHLORIDE 5-0.45 % IV SOLN: INTRAVENOUS | Status: DC

## 2024-05-24 MED ORDER — HALOPERIDOL LACTATE 5 MG/ML IJ SOLN
2.0000 mg | Freq: Once | INTRAMUSCULAR | Status: AC
  Administered 2024-05-24: 2 mg via INTRAVENOUS
  Filled 2024-05-24: qty 1

## 2024-05-24 MED ORDER — HALOPERIDOL LACTATE 5 MG/ML IJ SOLN: 2.0000 mg | Freq: Once | INTRAMUSCULAR | Status: DC

## 2024-05-24 NOTE — Progress Notes (Signed)
 EEG complete, results are pending.

## 2024-05-24 NOTE — Consult Note (Signed)
 NEUROLOGY CONSULT NOTE   Date of service: May 24, 2024 Patient Name: [name] MRN:  [id number] DOB:  1951-03-12 Chief Complaint: Abnormal episode Requesting Provider: Willette Adriana LABOR, MD  History of Present Illness  [name] is a 73 y.o. female with hx of stroke who had an abnormal episode concerning for possible seizure.  She was turning her head to the left, which nursing noted as she was being turned for cleaning.  She then noticed that her right arm arm was extended and clenched but the patient was still able to follow commands.  She remains stiff and therefore was given Haldol 2 mg and neurology was consulted.  On my arrival, the episode passed and she was able to turn her head without issue and had no stiffness of the extremities.  Her husband does note that he had made her angry by telling her that he had canceled her her appointment, and she been asking to go home repeatedly prior to this episode.  Past History   Past Medical History:  Diagnosis Date   Acute hepatic encephalopathy (HCC) 03/30/2024   Acute urinary retention 05/04/2022   Allergy 2006 ?   Contrast dye   Arthritis 2016 ??   Knees and thumb   Cancer (HCC)    cecum   Cataract 2021   Surgery scheduled July 2023   Colon cancer [facility]) 2003   Elevated liver function tests    Esophageal varices (HCC)    Heart murmur On file   Hemorrhage of gastrointestinal tract 05/04/2011   Hypertension 2021   Hypothyroidism    Iron deficiency anemia    Liver disease    chemotherapy complication, per pt, shunts placed to bypass liver   Malignant neoplasm of cecum (HCC)    Portal hypertension (HCC)    Skin cancer 2019   Splenomegaly     Past Surgical History:  Procedure Laterality Date   COLON SURGERY  2004   Cancer   COSMETIC SURGERY  2021   Skin cancer   ESOPHAGEAL VARICE LIGATION     EYE SURGERY     HEMICOLECTOMY  01/08/2003   IR RADIOLOGIST EVAL & MGMT  12/20/2020   IR RADIOLOGIST EVAL & MGMT   05/29/2021   LIVER SURGERY     shunts placed after chemo complication   SKIN FULL THICKNESS GRAFT N/A 09/12/2019   Procedure: debridement and FTSG to the nose from left upper arm;  Surgeon: Elisabeth Craig RAMAN, MD;  Location: [facility];  Service: Plastics;  Laterality: N/A;  2 hours, please   TIPS PROCEDURE      Family History: Family History  Problem Relation Age of Onset   Arthritis Mother    Hearing loss Mother    Heart disease Mother    Hypertension Mother    Miscarriages / [place] Mother    Arthritis Father    Diabetes Father    Heart disease Father    Cancer Maternal Aunt    Breast cancer Neg Hx    Colon cancer Neg Hx    Esophageal cancer Neg Hx    Pancreatic cancer Neg Hx    Stomach cancer Neg Hx    Stroke Neg Hx     Social History  reports that she has never smoked. She has never used smokeless tobacco. She reports that she does not drink alcohol  and does not use drugs.  Allergies  Allergen Reactions   Contrast Media [Iodinated Contrast Media] Hives    Medications  Current Facility-Administered Medications:    acetaminophen  (TYLENOL ) tablet 650 mg, 650 mg, Oral, Q6H PRN **OR** acetaminophen  (TYLENOL ) suppository 650 mg, 650 mg, Rectal, Q6H PRN, Howerter, Justin B, DO   aspirin  EC tablet 81 mg, 81 mg, Oral, Daily, Howerter, Justin B, DO, 81 mg at 05/23/24 9052   Chlorhexidine  Gluconate Cloth 2 % PADS 6 each, 6 each, Topical, Daily, Gherghe, Costin M, MD, 6 each at 05/24/24 1623   dextrose  5 % and 0.45 % NaCl infusion, , Intravenous, Continuous, Shahmehdi, Seyed A, MD, Last Rate: 100 mL/hr at 05/24/24 1623, New Bag at 05/24/24 1623   feeding supplement (ENSURE PLUS HIGH PROTEIN) liquid 237 mL, 237 mL, Oral, BID BM, Vann, Jessica U, DO   lactulose  (CHRONULAC ) 10 GM/15ML solution 30 g, 30 g, Oral, Q6H, Howerter, Justin B, DO, 30 g at 05/24/24 1820   lactulose  (CHRONULAC ) enema 200 gm, 300 mL, Rectal, BID, Shahmehdi, Seyed A, MD, 300 mL at 05/24/24  1602   levothyroxine  (SYNTHROID ) tablet 25 mcg, 25 mcg, Oral, Q0600, Howerter, Justin B, DO, 25 mcg at 05/24/24 0731   lip balm (CARMEX) ointment, , Topical, PRN, Gherghe, Costin M, MD   melatonin tablet 3 mg, 3 mg, Oral, QHS PRN, Howerter, Justin B, DO   metoprolol tartrate (LOPRESSOR) injection 2.5 mg, 2.5 mg, Intravenous, Q4H PRN, Howerter, Justin B, DO   ondansetron  (ZOFRAN ) injection 4 mg, 4 mg, Intravenous, Q6H PRN, Howerter, Justin B, DO, 4 mg at 06-08-24 1311   rifaximin  (XIFAXAN ) tablet 550 mg, 550 mg, Oral, BID, Gherghe, Costin M, MD, 550 mg at 05/23/24 2143   tiZANidine (ZANAFLEX) tablet 2 mg, 2 mg, Oral, TID, Shahmehdi, Seyed A, MD  Vitals   Vitals:   05/24/24 1500 05/24/24 1549 05/24/24 1600 05/24/24 1700  BP: (!) 141/65  (!) 117/55 (!) 86/39  Pulse: 76  66 63  Resp: 17  (!) 9 11  Temp:  (!) 97.1 F (36.2 C)    TempSrc:      SpO2: 99%  97% 99%  Weight:      Height:        Body mass index is 26.79 kg/m.   Physical Exam   Constitutional: Appears well-developed and well-nourished.    Neurologic Examination    Neuro: Mental Status: Patient is sleepy but arousable, I am able to get her to answer orientation questions.    Cranial Nerves: II: She does not count, but endorses seeing fingers wiggle in each visual field. Pupils are equal, round, and reactive to light.   III,IV, VI: EOMI without ptosis or diploplia.  V: Facial sensation is symmetric to temperature VII: Facial movement is symmetric.  VIII: hearing is intact to voice X: Uvula elevates symmetrically XII: tongue is midline without atrophy or fasciculations.  Motor: She is able to follow commands and lift all extremities against gravity  sensory: As above  Cerebellar: Does not perform       Labs/Imaging/Neurodiagnostic studies   CBC:  Recent Labs  Lab 2024-06-08 0425 06/08/2024 2025 05/22/24 0255 05/23/24 0302 05/24/24 0318  WBC 5.2 4.1   < > 5.1 4.4  NEUTROABS 4.0 PENDING  --   --   --    HGB 6.4* 8.1*   < > 7.7* 7.8*  HCT 20.6* 26.3*   < > 24.8* 25.2*  MCV 105.1* 104.8*   < > 103.8* 103.3*  PLT 136* 143*   < > 126* 137*   < > = values in this interval not displayed.   Basic Metabolic  Panel:  Lab Results  Component Value Date   NA 142 05/24/2024   K 3.9 05/24/2024   CO2 20 (L) 05/24/2024   GLUCOSE 84 05/24/2024   BUN 19 05/24/2024   CREATININE 0.92 05/24/2024   CALCIUM  8.3 (L) 05/24/2024   GFRNONAA >60 05/24/2024   GFRAA >60 08/17/2020   Lipid Panel:  Lab Results  Component Value Date   LDLCALC 69 04/16/2024   HgbA1c:  Lab Results  Component Value Date   HGBA1C 3.7 (L) 04/16/2024   Urine Drug Screen:     Component Value Date/Time   LABOPIA NONE DETECTED 05/20/2024 0428   COCAINSCRNUR NONE DETECTED 05/20/2024 0428   LABBENZ NONE DETECTED 05/20/2024 0428   AMPHETMU NONE DETECTED 05/20/2024 0428   THCU NONE DETECTED 05/20/2024 0428   LABBARB NONE DETECTED 05/20/2024 0428    Alcohol  Level     Component Value Date/Time   [facility] <15 04/15/2024 1241   INR  Lab Results  Component Value Date   INR 1.4 (H) 05/21/2024   APTT  Lab Results  Component Value Date   APTT 38 (H) 04/15/2024    ASSESSMENT   Alona Danford is a 73 y.o. female with a history of previous stroke who had an episode of head turning and stiffening.  There were some unusual aspects, such as being able to follow commands even in the stiff extremities, and therefore I am not certain that this was seizure.  Certainly I would have a low threshold for starting seizure medicine if she were to display any further episodes of concern, especially given her history of cortical strokes.  I would favor repeating MRI, as well as checking an EEG.  RECOMMENDATIONS  MRI brain EEG Continue treatment of underlying hepatic encephalopathy ______________________________________________________________________    Signed, Aisha Seals, MD [facility]

## 2024-05-24 NOTE — Progress Notes (Signed)
 Called to bedside for concern of neurologic changes. While cleaning patient up, staff noted right arm stiffened and head/neck staying left sided. Per husband, he had told her he cancelled her hair appointment and she immediately changed and stopped responding, following commands. Husband informed staff she has had similar episodes where she becomes upset and refuses to interact with staff on prior admissions.   On assessment she is able to identify her husband and repeat I want to go home multiple times. Speech clear without slur. Pupils 3 mm equal and brisk. Attempted to lift head but meeting resistance with any movement (rotation, flexion and extension). When staff moving around room it was noted she was tracking us  with her eyes.   Response to painful stimulus noted in all extremities-withdrawing from pain. RN lifted arms up and noted muscular resistance as it felt she was pulling back to her body. Attempted to lift legs and felt patient resistance pushing back into bed without commands to do so. Weak grip bilaterally. Blinks to threat bilaterally.   Shahmehedi MD paged and at bedside to assess.

## 2024-05-24 NOTE — Progress Notes (Signed)
 PROGRESS NOTE    Patient: [name]                            PCP: [name], NP                    DOB: [birth year]            DOA: 05/20/2024 FMW:[id number]             DOS: 05/24/2024, 2:00 PM   LOS: 4 days   Date of Service: The patient was seen and examined on 05/24/2024  Subjective:   The patient was seen and examined this morning, nonverbal, contracted stiff neck with a held tilted forward slightly to the side, stiff bilateral arms and legs Blanken, eyes open not communicating  Otherwise hemodynamically stable Labs reviewed, medications reviewed Husband and nursing staff present at bedside  Ammonia level improved to 61, Continue neurochecks, obtaining CT scan, neurology consulted  Brief Narrative:   Valeska Haislip 73 year old female with liver cirrhosis, prior EV's and GI bleed, status post TIPS, PAF not on anticoagulation, hypothyroidism, anemia of chronic disease who comes into the hospital with increased confusion. She is being brought by her husband. She has had prior hospitalizations for hepatic encephalopathy. Husband reports that she has done this before, and has progressively gotten worse over the last week. She was found to have an ammonia level almost 200. She has no white count, no fever, clinically without signs of having an infection or GI bleed at this point     Assesement and Plan: Principal Problem:   Acute metabolic encephalopathy Active Problems:   AKI (acute kidney injury) (HCC)   Cirrhosis (HCC)   Acquired hypothyroidism   Hepatic encephalopathy (HCC)   Anemia of chronic disease   SIRS (systemic inflammatory response syndrome) (HCC)   Acute prerenal azotemia   Paroxysmal atrial fibrillation (HCC)     Principal problem:  Hepatic encephalopathy, underlying decompensated liver cirrhosis  - Patient was found in a Harmonics State This Morning, Stiff Legs Arms, and Neck, Eyes Are Open - Noncommunicating Above symptoms acute, new onset - Per  husband and nursing staff patient was verbal this morning - Per husband after notifying her of cancellation of her her appointment her mental status changed  -Hemodynamically stable ammonia level low at 61  -she has had progressive confusion over the past week, currently presentation consistent with hepatic encephalopathy. - -Apparently she was tolerating oral lactulose , switching back to lactulose  enema  -Husband does not want NG tube so we will continue enemas - No evidence of an acute infectious process, no evidence of GI bleed  - Consulted neurology for further evaluation and recommendations - Will continue with neurochecks   Active problems  Paroxysmal A-fib-stable, rate controlled, not on anticoagulation due to prior GI bleed   Anemia of liver disease-no evidence of GI bleed,  - S/p 1U PRBC transfusion 05/23/2024    Latest Ref Rng & Units 05/24/2024    3:18 AM 05/23/2024    3:02 AM 05/22/2024    2:55 AM  CBC  WBC 4.0 - 10.5 K/uL 4.4  5.1  6.3   Hemoglobin 12.0 - 15.0 g/dL 7.8  7.7  8.0   Hematocrit 36.0 - 46.0 % 25.2  24.8  26.2   Platelets 150 - 400 K/uL 137  126  127      Hypothyroidism-continue Synthroid .  TSH normal   AKI -prior creatinine  earlier this month 0.7 -IVF while not eating   Hypernatremia -IVF-change to D5 half-normal saline     ----------------------------------------------------------------------------------------------------------------------------------------------- Nutritional status:  The patient's BMI is: Body mass index is 26.79 kg/m. I agree with the assessment and plan as outlined  ------------------------------------------------------------------------------------------------------------------------------------------------  DVT prophylaxis:  SCDs Start: 05/20/24 2134   Code Status:   Code Status: Full Code  Family Communication: Husband present at bedside, discussed the findings, plan of care -Advance care planning has been discussed.    Admission status:   Status is: Inpatient Remains inpatient appropriate because: Needing close observation, as needed blood transfusion, rectal lactulose , continues evaluation for encephalopathy   Disposition: From  - home             Planning for discharge in 2 days: to   Procedures:   No admission procedures for hospital encounter.   Antimicrobials:  Anti-infectives (From admission, onward)    Start     Dose/Rate Route Frequency Ordered Stop   05/21/24 1000  rifaximin  (XIFAXAN ) tablet 550 mg        550 mg Oral 2 times daily 05/21/24 0705          Medication:   sodium chloride    Intravenous Once   aspirin  EC  81 mg Oral Daily   Chlorhexidine  Gluconate Cloth  6 each Topical Daily   feeding supplement  237 mL Oral BID BM   lactulose   30 g Oral Q6H   lactulose   300 mL Rectal BID   levothyroxine   25 mcg Oral Q0600   rifaximin   550 mg Oral BID   tiZANidine  2 mg Oral TID    acetaminophen  **OR** acetaminophen , lactulose , lip balm, melatonin, metoprolol tartrate, ondansetron  (ZOFRAN ) IV   Objective:   Vitals:   05/24/24 1113 05/24/24 1200 05/24/24 1207 05/24/24 1300  BP:  (!) 139/59  (!) 92/50  Pulse: 73 77  66  Resp: 12 13  11   Temp:   (!) 96.8 F (36 C)   TempSrc:   Axillary   SpO2: 97% 97%  98%  Weight:      Height:        Intake/Output Summary (Last 24 hours) at 05/24/2024 1400 Last data filed at 05/24/2024 0000 Gross per 24 hour  Intake 60 ml  Output 790 ml  Net -730 ml   Filed Weights   05/20/24 1516 05/21/24 1107  Weight: 68 kg 67.3 kg     Physical examination:        General:  Awake-catatonic, lower upper and neck remains stiff, nonverbal noncommunicating  HEENT:  Normocephalic, PERRL, otherwise with in Normal limits   Neuro:  Limited exam patient is awake not communicating does not follow command, stiff upper and lower extremities  Lungs:   Clear to auscultation BL, Respirations unlabored,  No wheezes / crackles  Cardio:    S1/S2, RRR,  No murmure, No Rubs or Gallops   Abdomen:  Soft, non-tender, bowel sounds active all four quadrants, no guarding or peritoneal signs.  Muscular  skeletal:  Limited exam -stiff upper and lower extremities, cervical spine stiffness 2+ pulses,  symmetric, No pitting edema  Skin:  Dry, warm to touch, negative for any Rashes,  Wounds: Please see nursing documentation    ------------------------------------------------------------------------------------------------------------------------------    LABs:     Latest Ref Rng & Units 05/24/2024    3:18 AM 05/23/2024    3:02 AM 05/22/2024    2:55 AM  CBC  WBC 4.0 - 10.5 K/uL 4.4  5.1  6.3  Hemoglobin 12.0 - 15.0 g/dL 7.8  7.7  8.0   Hematocrit 36.0 - 46.0 % 25.2  24.8  26.2   Platelets 150 - 400 K/uL 137  126  127       Latest Ref Rng & Units 05/24/2024    3:18 AM 05/23/2024    3:02 AM 05/22/2024    2:55 AM  CMP  Glucose 70 - 99 mg/dL 84  72  85   BUN 8 - 23 mg/dL 19  27  40   Creatinine 0.44 - 1.00 mg/dL 9.07  8.97  8.71   Sodium 135 - 145 mmol/L 142  140  146   Potassium 3.5 - 5.1 mmol/L 3.9  3.6  3.8   Chloride 98 - 111 mmol/L 115  113  117   CO2 22 - 32 mmol/L 20  21  21    Calcium  8.9 - 10.3 mg/dL 8.3  8.3  8.6   Total Protein 6.5 - 8.1 g/dL 5.5   5.5   Total Bilirubin 0.0 - 1.2 mg/dL 1.3   1.6   Alkaline Phos 38 - 126 U/L 123   112   AST 15 - 41 U/L 48   53   ALT 0 - 44 U/L 32   35        Micro Results Recent Results (from the past 240 hours)  MRSA Next Gen by PCR, Nasal     Status: None   Collection Time: 05/21/24  1:27 PM   Specimen: Nasal Mucosa; Nasal Swab  Result Value Ref Range Status   MRSA by PCR Next Gen NOT DETECTED NOT DETECTED Final    Comment: (NOTE) The GeneXpert MRSA Assay (FDA approved for NASAL specimens only), is one component of a comprehensive MRSA colonization surveillance program. It is not intended to diagnose MRSA infection nor to guide or monitor treatment for MRSA infections. Test performance is  not FDA approved in patients less than 46 years old. Performed at [facility], [address] [place], [place] [address]     Radiology Reports No results found.  SIGNED: Adriana DELENA Grams, MD, FHM. FAAFP. [facility] - [facility] Critical care time spent - 55 min.  In seeing, evaluating and examining the patient. Reviewing medical records, labs, drawn plan of care. [facility],  Pager (please use [email or web address] to page/ text) Please use Epic Secure Chat for non-urgent communication (7AM-7PM)  If 7PM-7AM, please contact night-coverage [email or web address], 05/24/2024, 2:00 PM

## 2024-05-24 NOTE — Plan of Care (Signed)
  Problem: Clinical Measurements: Goal: Respiratory complications will improve Outcome: Progressing Goal: Cardiovascular complication will be avoided Outcome: Progressing   Problem: Nutrition: Goal: Adequate nutrition will be maintained Outcome: Progressing   Problem: Safety: Goal: Ability to remain free from injury will improve Outcome: Progressing   Problem: Coping: Goal: Level of anxiety will decrease Outcome: Not Progressing

## 2024-05-24 NOTE — Progress Notes (Signed)
 Patient became very irritable at 0345. Assessed the patient for any changes, however; patient's husband was adamant that her BP and HR were not correct. Made the patient's husband aware that due to irritability the HR ad BP of a patient can become elevated however the vitals at the time were still within the normal parameters. Patient's husband then became more upset stating the incompetence of our staff and that he would settle the patient himself. This RN completed a focused assessment, provided the stable findings to the patient's husband that the patient is stable. Patient's husband said he could do the rest and didn't need assistance from this RN. Due to the nature of the situation, Press photographer, Ward, RN was made aware of the de-escalated situation.

## 2024-05-24 NOTE — Hospital Course (Addendum)
 [name] 73 year old female with liver cirrhosis, prior EV's and GI bleed, status post TIPS, PAF not on anticoagulation, hypothyroidism, anemia of chronic disease who comes into the hospital with increased confusion. She is being brought by her husband. She has had prior hospitalizations for hepatic encephalopathy. Husband reports that she has done this before, and has progressively gotten worse over the last week. She was found to have an ammonia level almost 200. She has no white count, no fever, clinically without signs of having an infection or GI bleed at this point     Assesement and Plan: Principal Problem:   Acute metabolic encephalopathy Active Problems:   AKI (acute kidney injury) (HCC)   Cirrhosis (HCC)   Acquired hypothyroidism   Hepatic encephalopathy (HCC)   Anemia of chronic disease   SIRS (systemic inflammatory response syndrome) (HCC)   Acute prerenal azotemia   Paroxysmal atrial fibrillation (HCC)     Principal problem:  Hepatic encephalopathy, underlying decompensated liver cirrhosis  Hyperammonemia - Much improved mentation, back to baseline  - Episodes of encephalopathy, stiffness,-stroke workup-negative -CT and MRI of the brain have been reviewed, no acute abnormalities, chronic changes -Neurology was consulted, EEG no signs of seizures -Husband present at bedside stating patient is at baseline  - Continue with lactulose  oral/enema  -Aammonia level low at 61>>> 48 >> 43>> 80  POA: progressive confusion over the past week, currently presentation consistent with hepatic encephalopathy.  -Husband does not want NG tube so we will continue enemas - No evidence of an acute infectious process, no evidence of GI bleed - Continue neurochecks   GI bleed with history of liver cirrhosis and esophageal varices, history of TIPS procedure  - Thus far on this admission s/p 3U PRBC blood transfusion -GI consulted, -Hemoccult positive x 2  - Ultrasound TIPS  Doppler study revealed: Patent TIPS. Decreased velocities in the portal vein and tips compared to prior 2. Cholelithiasis.  N.p.o. in anticipation of EGD, pending GI evaluation 05/27/2024   Acute on chronic anemia - anemia of liver disease-with iron deficiency then GI bleed - S/p 1U PRBC transfusion 05/23/2024 - No signs of bleeding, transfusing 2U PRBC 05/25/2024 - Follow-up iron studies, B12, folate level    Latest Ref Rng & Units 05/27/2024    8:20 AM 05/26/2024    3:26 AM 05/25/2024    5:06 PM  CBC  WBC 4.0 - 10.5 K/uL 3.0  3.2    Hemoglobin 12.0 - 15.0 g/dL 89.5  89.9  9.5   Hematocrit 36.0 - 46.0 % 31.8  31.2  29.6   Platelets 150 - 400 K/uL 145  125      Total iron 26, TIBC 265, percent sat 10 ferritin 17, folate 20.7 B12 972 - Will initiate oral iron supplements  Hypoalbuminemia  - Replacing with 25 g of IV albumin  05/27/24  hypotensive  -Midodrine  10 mg p.o. 3 times daily initiated, blood pressure stabilized Reducing midodrine  to 5 mg p.o. 3 times daily Monitoring closely -Status post 2U PRBC transfusion on 05/25/2024     Active problems:  Paroxysmal A-fib-stable, rate controlled, not on anticoagulation due to prior GI bleed   hypothyroidism-continue Synthroid .  TSH normal   AKI -monitoring closely, stable   Hypernatremia -improved with IV fluids now tolerating p.o., N.p.o. today for possible EGD  Hypokalemia -monitoring and replating orally

## 2024-05-24 NOTE — Procedures (Signed)
 Patient Name: Graham Doukas  MRN: [id number]  Epilepsy Attending: Arlin MALVA Krebs  Referring Physician/Provider: Michaela Aisha SQUIBB, MD  Date: 05/24/2024 Duration: 25.07 mins  Patient history: 73yo F with ams. EEG to evaluate for seizure.  Level of alertness: Awake  AEDs during EEG study: None  Technical aspects: This EEG study was done with scalp electrodes positioned according to the 10-20 International system of electrode placement. Electrical activity was reviewed with band pass filter of 1-70Hz , sensitivity of 7 uV/mm, display speed of 30mm/sec with a 60Hz  notched filter applied as appropriate. EEG data were recorded continuously and digitally stored.  Video monitoring was available and reviewed as appropriate.  Description: The posterior dominant rhythm consists of 7.5 Hz activity of moderate voltage (25-35 uV) seen predominantly in posterior head regions, symmetric and reactive to eye opening and eye closing. EEG showed continuous generalized predominantly 5 to 6 Hz theta slowing admixed with intermittent 2-3hz  delta slowing. Hyperventilation and photic stimulation were not performed.      ABNORMALITY - Continuous slow, generalized   IMPRESSION: This study is suggestive of mild to moderate diffuse encephalopathy. No seizures or epileptiform discharges were seen throughout the recording.   Chaslyn Eisen O Rune Mendez

## 2024-05-24 NOTE — TOC Progression Note (Signed)
 Transition of Care [facility]) - Progression Note    Patient Details  Name: Kiante Petrovich MRN: [id number] Date of Birth: [birth year]  Transition of Care [facility]) CM/SW Contact  Jon ONEIDA Anon, RN Phone Number: 05/24/2024, 3:44 PM  Clinical Narrative:    Pt not medically ready for DC, psych has been consulted. TOC is following for any new recommendations.   Expected Discharge Plan: Home/Self Care Barriers to Discharge: Continued Medical Work up  Expected Discharge Plan and Services In-house Referral: NA Discharge Planning Services: CM Consult Post Acute Care Choice: NA Living arrangements for the past 2 months: Single Family Home                 DME Arranged: N/A DME Agency: NA       HH Arranged: NA HH Agency: NA         Social Determinants of Health (SDOH) Interventions SDOH Screenings   Food Insecurity: No Food Insecurity (05/21/2024)  Housing: Low Risk  (05/21/2024)  Transportation Needs: No Transportation Needs (05/21/2024)  Utilities: Not At Risk (05/21/2024)  Alcohol  Screen: Low Risk  (03/27/2024)  Depression (PHQ2-9): Low Risk  (05/11/2024)  Financial Resource Strain: Low Risk  (05/10/2024)  Physical Activity: Insufficiently Active (05/10/2024)  Social Connections: Socially Integrated (05/10/2024)  Recent Concern: Social Connections - Moderately Isolated (03/30/2024)  Stress: No Stress Concern Present (05/10/2024)  Tobacco Use: Low Risk  (05/22/2024)    Readmission Risk Interventions    05/22/2024    3:58 PM 03/31/2024    2:51 PM 12/23/2023    2:25 PM  Readmission Risk Prevention Plan  Transportation Screening Complete Complete Complete  PCP or Specialist Appt within 5-7 Days   Complete  PCP or Specialist Appt within 3-5 Days  Complete   Home Care Screening   Complete  Medication Review (RN CM)   Complete  HRI or Home Care Consult  Complete   Social Work Consult for Recovery Care Planning/Counseling  Complete   Palliative Care Screening  Not Applicable   Medication  Review Oceanographer) Complete Complete   HRI or Home Care Consult Complete    SW Recovery Care/Counseling Consult Complete    Palliative Care Screening Not Applicable    Skilled Nursing Facility Not Applicable

## 2024-05-24 NOTE — Plan of Care (Signed)
   Problem: Education: Goal: Knowledge of General Education information will improve Description: Including pain rating scale, medication(s)/side effects and non-pharmacologic comfort measures Outcome: Progressing   Problem: Clinical Measurements: Goal: Ability to maintain clinical measurements within normal limits will improve Outcome: Progressing   Problem: Activity: Goal: Risk for activity intolerance will decrease Outcome: Progressing   Problem: Coping: Goal: Level of anxiety will decrease Outcome: Progressing   Problem: Pain Managment: Goal: General experience of comfort will improve and/or be controlled Outcome: Progressing

## 2024-05-25 ENCOUNTER — Inpatient Hospital Stay (HOSPITAL_COMMUNITY)

## 2024-05-25 ENCOUNTER — Other Ambulatory Visit: Payer: Self-pay

## 2024-05-25 DIAGNOSIS — G9341 Metabolic encephalopathy: Secondary | ICD-10-CM | POA: Diagnosis not present

## 2024-05-25 DIAGNOSIS — K7682 Hepatic encephalopathy: Secondary | ICD-10-CM | POA: Diagnosis not present

## 2024-05-25 DIAGNOSIS — Z9689 Presence of other specified functional implants: Secondary | ICD-10-CM

## 2024-05-25 DIAGNOSIS — D649 Anemia, unspecified: Secondary | ICD-10-CM

## 2024-05-25 DIAGNOSIS — K7290 Hepatic failure, unspecified without coma: Secondary | ICD-10-CM

## 2024-05-25 DIAGNOSIS — K766 Portal hypertension: Secondary | ICD-10-CM

## 2024-05-25 DIAGNOSIS — R195 Other fecal abnormalities: Secondary | ICD-10-CM

## 2024-05-25 LAB — CBC
HCT: 21.4 % — ABNORMAL LOW (ref 36.0–46.0)
Hemoglobin: 6.5 g/dL — CL (ref 12.0–15.0)
MCH: 31.9 pg (ref 26.0–34.0)
MCHC: 30.4 g/dL (ref 30.0–36.0)
MCV: 104.9 fL — ABNORMAL HIGH (ref 80.0–100.0)
Platelets: 100 10*3/uL — ABNORMAL LOW (ref 150–400)
RBC: 2.04 MIL/uL — ABNORMAL LOW (ref 3.87–5.11)
RDW: 19.9 % — ABNORMAL HIGH (ref 11.5–15.5)
WBC: 3 10*3/uL — ABNORMAL LOW (ref 4.0–10.5)
nRBC: 0 % (ref 0.0–0.2)

## 2024-05-25 LAB — COMPREHENSIVE METABOLIC PANEL WITH GFR
ALT: 25 U/L (ref 0–44)
AST: 36 U/L (ref 15–41)
Albumin: 1.8 g/dL — ABNORMAL LOW (ref 3.5–5.0)
Alkaline Phosphatase: 90 U/L (ref 38–126)
Anion gap: 2 — ABNORMAL LOW (ref 5–15)
BUN: 17 mg/dL (ref 8–23)
CO2: 21 mmol/L — ABNORMAL LOW (ref 22–32)
Calcium: 7.8 mg/dL — ABNORMAL LOW (ref 8.9–10.3)
Chloride: 116 mmol/L — ABNORMAL HIGH (ref 98–111)
Creatinine, Ser: 0.74 mg/dL (ref 0.44–1.00)
GFR, Estimated: >60 mL/min (ref >60)
Glucose, Bld: 140 mg/dL — ABNORMAL HIGH (ref 70–99)
Potassium: 3.6 mmol/L (ref 3.5–5.1)
Sodium: 139 mmol/L (ref 135–145)
Total Bilirubin: 0.8 mg/dL (ref 0.0–1.2)
Total Protein: 4.7 g/dL — ABNORMAL LOW (ref 6.5–8.1)

## 2024-05-25 LAB — IRON AND TIBC
Iron: 26 ug/dL — ABNORMAL LOW (ref 28–170)
Saturation Ratios: 10 % — ABNORMAL LOW (ref 10.4–31.8)
TIBC: 265 ug/dL (ref 250–450)
UIBC: 239 ug/dL

## 2024-05-25 LAB — PREPARE RBC (CROSSMATCH)

## 2024-05-25 LAB — OCCULT BLOOD X 1 CARD TO LAB, STOOL: Fecal Occult Bld: POSITIVE — AB

## 2024-05-25 LAB — AMMONIA: Ammonia: 48 umol/L — ABNORMAL HIGH (ref 9–35)

## 2024-05-25 LAB — FERRITIN: Ferritin: 17 ng/mL (ref 11–307)

## 2024-05-25 LAB — HEMOGLOBIN AND HEMATOCRIT, BLOOD
HCT: 21.9 % — ABNORMAL LOW (ref 36.0–46.0)
HCT: 29.6 % — ABNORMAL LOW (ref 36.0–46.0)
Hemoglobin: 6.8 g/dL — CL (ref 12.0–15.0)
Hemoglobin: 9.5 g/dL — ABNORMAL LOW (ref 12.0–15.0)

## 2024-05-25 LAB — FOLATE: Folate: 20.7 ng/mL (ref >5.9)

## 2024-05-25 LAB — VITAMIN B12: Vitamin B-12: 972 pg/mL — ABNORMAL HIGH (ref 180–914)

## 2024-05-25 MED ORDER — SODIUM CHLORIDE 0.9 % IV SOLN: INTRAVENOUS | Status: AC

## 2024-05-25 MED ORDER — FUROSEMIDE 10 MG/ML IJ SOLN: 20.0000 mg | Freq: Once | INTRAMUSCULAR | Status: DC

## 2024-05-25 MED ORDER — FUROSEMIDE 10 MG/ML IJ SOLN
20.0000 mg | Freq: Once | INTRAMUSCULAR | Status: AC
  Administered 2024-05-25: 20 mg via INTRAVENOUS
  Filled 2024-05-25: qty 2

## 2024-05-25 MED ORDER — MIDODRINE HCL 5 MG PO TABS
10.0000 mg | ORAL_TABLET | Freq: Three times a day (TID) | ORAL | Status: DC
  Administered 2024-05-25 – 2024-05-27 (×7): 10 mg via ORAL
  Filled 2024-05-25 (×8): qty 2

## 2024-05-25 MED ORDER — SODIUM CHLORIDE 0.9% IV SOLUTION: Freq: Once | INTRAVENOUS | Status: AC

## 2024-05-25 MED ORDER — LACTATED RINGERS IV BOLUS
500.0000 mL | Freq: Once | INTRAVENOUS | Status: AC
  Administered 2024-05-25: 500 mL via INTRAVENOUS

## 2024-05-25 NOTE — Consult Note (Addendum)
 Error

## 2024-05-25 NOTE — Progress Notes (Addendum)
     Patient Name: [name]           DOB: September 20, 1951  MRN: [id number]      Admission Date: 05/20/2024  Attending Provider: Willette Adriana LABOR, MD  Primary Diagnosis: Acute metabolic encephalopathy   Level of care: Stepdown   OVERNIGHT PROGRESS REPORT   Hypotensive, SBP 70-80s with MAP 50-56 Mentation improving, more responsive and able to wake up. Afebrile.  Making adequate urine.   All other vitals stable. No acute distress reported.   Received a dose of Zanaflex last night. Anemic with liver disease. No signs of overt bleeding.  Last hemoglobin 7.8.  Recheck CBC   Addendum:  Received fluid bolus.  BP 97/54 (67).  Per RN assessment, patient is A/O x 4.   Avoid antihypertensive and sedative meds.  Check hemoglobin level, WBC,   0611-  hgb 6.5. Transfuse 1 unit prbc No overt bleeding reported. No melena, hematochezia, hematemesis.  BP soft, 90/40-50's.    Nissa Stannard, DNP, ACNPC- AG [facility] [place]

## 2024-05-25 NOTE — Progress Notes (Addendum)
 Bertram, RN aware that PICC will be placed tomorrow if still needed.  Day PICC nurse will follow up 7/4.  Patient has sufficient IV access at this time per [facility].

## 2024-05-25 NOTE — Progress Notes (Signed)
 NEUROLOGY CONSULT FOLLOW UP NOTE   Date of service: May 25, 2024 Patient Name: [name] MRN:  [id number] DOB:  09-13-1951  Interval Hx/subjective   No further episodes.  When I asked her about the event yesterday, she states I was bad and states that she was frustrated with people not listening to her.  Vitals   Vitals:   05/25/24 1630 05/25/24 1700 05/25/24 1800 05/25/24 2011  BP: (!) 87/47 (!) 92/47 (!) 100/48   Pulse: (!) 59 (!) 55 (!) 52   Resp: 20 13 11    Temp:    (!) 97.4 F (36.3 C)  TempSrc:    Oral  SpO2: 98% 98% 97%   Weight:      Height:         Body mass index is 26.87 kg/m.  Physical Exam   Constitutional: Appears well-developed and well-nourished.  Neurologic Examination    MS: Awake, alert, oriented to person, place, fact that she is in the hospital but not which hospital CN: EOMI, face symmetric Motor: No drift in either upper extremity Sensory: Intact to light touch  Medications  Current Facility-Administered Medications:    0.9 %  sodium chloride  infusion, , Intravenous, Continuous, Beather Delon Gibson, [place], Last Rate: 20 mL/hr at 05/25/24 1813, New Bag at 05/25/24 1813   acetaminophen  (TYLENOL ) tablet 650 mg, 650 mg, Oral, Q6H PRN **OR** acetaminophen  (TYLENOL ) suppository 650 mg, 650 mg, Rectal, Q6H PRN, Howerter, Justin B, DO   aspirin  EC tablet 81 mg, 81 mg, Oral, Daily, Howerter, Justin B, DO, 81 mg at 05/25/24 1047   Chlorhexidine  Gluconate Cloth 2 % PADS 6 each, 6 each, Topical, Daily, Gherghe, Costin M, MD, 6 each at 05/25/24 1445   feeding supplement (ENSURE PLUS HIGH PROTEIN) liquid 237 mL, 237 mL, Oral, BID BM, Vann, Jessica U, DO, 237 mL at 05/25/24 1049   furosemide  (LASIX ) injection 20 mg, 20 mg, Intravenous, Once, Shahmehdi, Seyed A, MD   lactulose  (CHRONULAC ) 10 GM/15ML solution 30 g, 30 g, Oral, Q6H, Howerter, Justin B, DO, 30 g at 05/25/24 1219   lactulose  (CHRONULAC ) enema 200 gm, 300 mL, Rectal, BID, Shahmehdi, Seyed A,  MD, 300 mL at 05/24/24 1602   levothyroxine  (SYNTHROID ) tablet 25 mcg, 25 mcg, Oral, Q0600, Howerter, Justin B, DO, 25 mcg at 05/25/24 0632   lip balm (CARMEX) ointment, , Topical, PRN, Gherghe, Costin M, MD   melatonin tablet 3 mg, 3 mg, Oral, QHS PRN, Howerter, Justin B, DO   metoprolol tartrate (LOPRESSOR) injection 2.5 mg, 2.5 mg, Intravenous, Q4H PRN, Howerter, Justin B, DO   midodrine  (PROAMATINE ) tablet 10 mg, 10 mg, Oral, TID WC, Shahmehdi, Seyed A, MD, 10 mg at 05/25/24 1553   ondansetron  (ZOFRAN ) injection 4 mg, 4 mg, Intravenous, Q6H PRN, Howerter, Justin B, DO, 4 mg at 05/21/24 1311   rifaximin  (XIFAXAN ) tablet 550 mg, 550 mg, Oral, BID, Gherghe, Costin M, MD, 550 mg at 05/25/24 1047   tiZANidine (ZANAFLEX) tablet 2 mg, 2 mg, Oral, TID, Shahmehdi, Seyed A, MD, 2 mg at 05/25/24 1047  Labs and Diagnostic Imaging    Imaging(Personally reviewed): MRI brain with no acute findings  Assessment   Agatha Duplechain is a 73 y.o. female with history of previous cortical infarct who had an episode of left head turn and diffuse stiffening.  There is question of whether this was behavioral episode, and seems to be supported by the patient's recollection of the event.  The description of the head turn and  stiffening, however, continues to concern me for a possible complex partial seizure, and if she were to have any further episodes I would have a low threshold for starting antiepileptic therapy.  That being said, with question of etiology and negative EEG, I do not think I would favor starting him at this time.  Recommendations  No further acute recommendations, please call with further questions or concerns ______________________________________________________________________   Signed, Aisha Seals, MD [facility]

## 2024-05-25 NOTE — Consult Note (Signed)
 Consultation  Referring Provider:  Dr. Willette     Primary Care Physician:  [name], NP Primary Gastroenterologist:  Dr. Abran       Reason for Consultation: Anemia with heme positive stools in the setting of noncirrhotic portal hypertension and suspected NASH cirrhosis with esophageal varices and hepatic encephalopathy         HPI:   [name] is a 73 y.o. 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    Our service was recently consulted on 03/31/2024 for this patient in the setting of hepatic encephalopathy.  At that time discussed that her husband had increased Lactulose  from 3 to 4 tablespoons 3 times daily and started Xifaxan  500 mg twice daily at home without improvement.  At the time hemoglobin 9.9 down from a baseline of hemoglobin 11.2.  Patient was on our service for a total of 5 days at the time.  She eventually improved with Lactulose  and Xifaxan .    At time of presentation patient's history was provided by the husband given her altered mental status.  Noted history of cirrhosis with multiple prior hospitalizations for acute hepatic encephalopathy.  Progressive confusion and somnolence over the past week.  This was despite good compliance with Lactulose .  Remains on a daily baby aspirin  for A-fib.  Noted an MRI of the brain performed 04/23/2024 which demonstrated an acute stroke involving the left PCA.  She remained on Lasix  and spironolactone  outpatient for portal hypertension.    We are consulted this morning now given a drop in  hemoglobin to 6.5.     Per hospitalist notes today patient has much improved mentation and easily arousable, mildly hypotensive.  She had a rectal enema lactulose  which was switched to p.o. today.  Hemodynamically stable and ammonia level decreased some 61-48.  She is already received 1 unit PRBCs on 06/11/2024.  She was given another 2 units today.     Today, patient is found alert and oriented, but is lethargic and falls asleep volume in the room, her husband provides all of her history.  He is very well aware of her course.  He explains that this admission was different.  Apparently she had been having bowel movements 9 out of the past 10 days on her usual lactulose  3 times daily and so he had not thought much about her being encephalopathic, but she increasingly got more confused to the point where he brought her into the hospital.  Mental status wise he tells me she is about back to baseline other than slightly lethargic today.  She is now receiving lactulose  orally as of today.  He also tells me that her fluid is about baseline for her.  He does not think she needs to be on the Lasix  anymore.  He is worried about her dropping blood pressures.    Concerning to the husband is that he really think she needs a TIPS revision.  He is telling me that he is almost certain that is the cause of her bleeding and thinks it is because its fully blocked and now.    Patient and her husband did go see Stephane Pila, NP about  2 weeks ago.  Visit went fairly well there per him.  He is concerned because the hospitalist has been telling him that his wife is in end-stage liver disease.    Denies fever, chills or weight loss.      ED Course from previous physicians notes:  Vital signs in the ED were notable for the following: Initial temperature documented to be 93.1, with ensuing improvement in 97.5 without interval heating blanket, heart rates in the 70s to 90s; still blood pressures in the 90s to low 100s; respiratory  rate 16-24, oxygen saturation 94 to 100% on room air.   Labs were notable for the following: CMP was notable for the following: Sodium 137, bicarbonate 25, BUN 45 compared to 13 on 04/23/2024, creatinine 1.17 compared to 0.76 on 04/23/2024, glucose 115, calcium  adjusted for mild hypoalbuminemia noted be 10.0, albumin  3.1, alkaline phosphatase 161, total bilirubin 1.3, AST 79, ALT 47.  Ammonia level 199 compared to most recent prior value of 49 on 04/04/2024.  CBC notable for white blood cell count 4300, hemoglobin 8.5 associated with macrocytic finding as well as normochromic finding, and relative demonstration prior hemoglobin data point of 8.8 on 04/23/2024.  Urinalysis was notable for no white blood cells as well as leukocyte esterase/nitrate negative.  Imaging with a 1 view chest x-ray showing cardiomegaly without evidence of acute cardiopulmonary process; noncontrast CT of the head showed no evidence of acute intracranial process, some evolution of the left PCA territory infarct that was previously noted on MRI of the brain 6/1  GI history: Taken from previous notes. She developed Oxaliplatin induced non-cirrhotic portal hypertension with esophageal varices and had a variceal hemorrhage in 2012 for which she underwent TIPS at [facility]. Her most recent EGD was in 2010. Doppler ultrasound of the liver 11/2020 that questioned possible TIPS stenosis. She was seen by interventional radiology and decision was made not to attempt revision given new hepatic encephalopathy. Since then, she has had multiple hospitalizations for encephalopathy, including hospital admission 02/25/2023 through 03/13/2023 with encephalopathy, AKI, cystitis, and bradycardia with SVT. This hospitalization occurred after patient fell and broke her left wrist and was given Oxycodone . She became constipated and encephalopathic. Treated with Lactulose  via NG tube and Xifaxan  in the hospital. UTI treated. Her most recent hospital admission with  hepatic encephalopathy was 12/23/2023 which improved after receiving Xifaxan  in conjunction with Lactulose . Brain MRI showed subacute infarcts, followed by neurology. Sono imaging showed in stent stenosis of her TIPS and in light of her neurological findings, TIPS interrogation/potential revision was deferred to the outpatient setting but was not pursued. She is followed by Stephane Quest NP at [facility]. Patient is a poor candidate for transplant given history of adenocarcinoma of the colon and advanced age. Last surveillance colonoscopy was in 2015. Paternal grandmother diagnosed with colon cancer in her 29s.   PAST IMAGE STUDIES:   ECHO 12/23/2023: Left ventricular ejection fraction, by estimation, is 60 to 65%. The left ventricle has normal function. The left ventricle has no regional wall motion abnormalities. Left ventricular diastolic parameters are consistent with Grade II diastolic dysfunction (pseudonormalization). Elevated left ventricular end-diastolic pressure. 1. Right ventricular systolic function is normal. The right ventricular size is moderately enlarged. There is mildly elevated pulmonary artery systolic pressure. 2. 3. Left atrial size was mildly dilated. The mitral valve is degenerative. Trivial mitral valve regurgitation. No evidence of mitral stenosis. Severe mitral annular calcification. 4. The aortic valve is tricuspid. Aortic valve regurgitation is not  visualized. Aortic valve sclerosis/calcification is present, without any evidence of aortic stenosis. Aortic valve Vmax measures 1.78 m/s. 5. The inferior vena cava is dilated in size with >50% respiratory variability, suggesting right atrial pressure of 8 mmHg. 6. Evidence of atrial level shunting detected by color flow Doppler. Agitated saline contrast bubble study was positive with shunting observed within 3-6 cardiac cycles suggestive of interatrial shunt.   Brain MRI 12/22/2023: IMPRESSION: 1. Multiple small acute/early  subacute infarcts in the left greater than right cerebral hemispheres and right cerebellum. 2. Moderate chronic small vessel ischemic disease.   Limited abdominal ultrasound with liver Doppler 12/22/2023: IMPRESSION: TIPS is patent, however, abnormal waveforms and velocity suggesting developing in stent stenosis.   Cholelithiasis without sonographic evidence of acute cholecystitis.   The ultrasound documents varices of the upper abdomen, and negative for ascites.   CTAP 12/21/2023 with contrast:   FINDINGS: Lower chest: No pleural or pericardial effusion   Hepatobiliary: . TIPS stent stable position. No focal liver lesion or biliary ductal dilatation.   Pancreas: Unremarkable. No pancreatic ductal dilatation or surrounding inflammatory changes.   Spleen: Normal in size without focal abnormality.   Adrenals/Urinary Tract: Adrenal glands are unremarkable. Kidneys are normal, without renal calculi, focal lesion, or hydronephrosis. Bladder is unremarkable.   Stomach/Bowel: Stomach nondistended, unremarkable. Small bowel decompressed. Post right hemicolectomy. Anastomotic staple line right mid abdomen. Remaining colon is incompletely distended, unremarkable.   Vascular/Lymphatic: Mild scattered aortoiliac calcified plaque. No abdominal or pelvic adenopathy.   Reproductive: Uterus and bilateral adnexa are unremarkable.   Other: No abdominal ascites. No free air. Trace free fluid in the pelvis.   Musculoskeletal: New compression fracture deformity of L1 with approximately 50% loss of height anteriorly, with diffuse sclerosis, no significant retropulsion. Stable compression fracture deformities of T10, T12, L2, and L3.   IMPRESSION: 1. No acute findings. 2. New L1 compression fracture deformity. 3.  Aortic Atherosclerosis     Liver biopsy and colon resection path reports:   Liver biopsy 05/15/2008: Liver, NOS, biopsy  - Sinusoidal dilatation and congestion.  - Patchy  central venous congestion.  - Focal, minimal portal chronic inflammatory infiltrate consisting of  lymphocytes, plasma cells, and rare eosinophils (see comment).  - Minimal periportal fibrosis and rare portal to portal septa with intact  architecture (stage 2 fibrosis).  - No evidence of steatosis, hepatocellular necrosis, hepatocellular iron  deposition, or PAS/D positive intracellular globules.    Path report from colon resection 2004: COLON, SEGMENTAL RESECTION, RIGHT: MODERATELY DIFFERENTIATED  COLONIC-TYPE ADENOCARCINOMA ARISING IN REGION OF ILEOCECAL VALVE  WITH EXTENSION THROUGH FULL THICKNESS OF MUSCLE WALL.  LYMPH NODES: ONE OF SEVENTEEN LYMPH NODES WITH METASTATIC  CARCINOMA, SEE COMMENT.  Previously diagnosed as: Sixteen benign lymph nodes, no tumor  present.     Past Medical History:  Diagnosis Date   Acute hepatic encephalopathy (HCC) 03/30/2024   Acute urinary retention 05/04/2022   Allergy 2006 ?   Contrast dye   Arthritis 2016 ??   Knees and thumb   Cancer [facility])    cecum   Cataract 2021   Surgery scheduled July 2023   Colon cancer [facility]) 2003   Elevated liver function tests    Esophageal varices (HCC)    Heart murmur On file   Hemorrhage of gastrointestinal tract 05/04/2011   Hypertension 2021   Hypothyroidism    Iron deficiency anemia    Liver disease    chemotherapy complication, per pt, shunts placed to bypass liver   Malignant neoplasm of  cecum (HCC)    Portal hypertension (HCC)    Skin cancer 2019   Splenomegaly     Past Surgical History:  Procedure Laterality Date   COLON SURGERY  2004   Cancer   COSMETIC SURGERY  2021   Skin cancer   ESOPHAGEAL VARICE LIGATION     EYE SURGERY     HEMICOLECTOMY  01/08/2003   IR RADIOLOGIST EVAL & MGMT  12/20/2020   IR RADIOLOGIST EVAL & MGMT  05/29/2021   LIVER SURGERY     shunts placed after chemo complication   SKIN FULL THICKNESS GRAFT N/A 09/12/2019   Procedure: debridement and FTSG to the nose from  left upper arm;  Surgeon: Elisabeth Craig RAMAN, MD;  Location: [facility];  Service: Plastics;  Laterality: N/A;  2 hours, please   TIPS PROCEDURE      Family History  Problem Relation Age of Onset   Arthritis Mother    Hearing loss Mother    Heart disease Mother    Hypertension Mother    Miscarriages / [place] Mother    Arthritis Father    Diabetes Father    Heart disease Father    Cancer Maternal Aunt    Breast cancer Neg Hx    Colon cancer Neg Hx    Esophageal cancer Neg Hx    Pancreatic cancer Neg Hx    Stomach cancer Neg Hx    Stroke Neg Hx      Social History   Tobacco Use   Smoking status: Never   Smokeless tobacco: Never   Tobacco comments:    Never smoked  Vaping Use   Vaping status: Never Used  Substance Use Topics   Alcohol  use: Never   Drug use: Never    Prior to Admission medications   Medication Sig Start Date End Date Taking? Authorizing Provider  Accu-Chek Softclix Lancets lancets 3 (three) times daily. 04/05/24   [provider]  aspirin  EC 81 MG tablet Take 1 tablet (81 mg total) by mouth daily. Swallow whole. Patient taking differently: Take 81 mg by mouth in the morning. Swallow whole. 12/26/23   Bryn Bernardino NOVAK, MD  Blood Glucose Monitoring Suppl DEVI 1 each by Does not apply route in the morning, at noon, and at bedtime. May substitute to any manufacturer covered by patient's insurance. 04/05/24   Rojelio Nest, DO  CRANBERRY PO Take 1 tablet by mouth 2 (two) times daily.    [provider]  furosemide  (LASIX ) 20 MG tablet Take 20-40 mg by mouth See admin instructions. Take blood pressure and depending on reading take 20mg  or 40mg  by mouth daily.    [provider]  lactulose  (CHRONULAC ) 10 GM/15ML solution Take 45 mLs (30 g total) by mouth 3 (three) times daily. Patient taking differently: Take 60 g by mouth 3 (three) times daily. 03/13/23   Pokhrel, Laxman, MD  levOCARNitine  (CARNITOR ) 330 MG tablet Take 330 mg  by mouth 3 (three) times daily. 05/23/22   [provider]  levothyroxine  (SYNTHROID ) 25 MCG tablet Take 25 mcg by mouth daily before breakfast.    [provider]  liver oil-zinc  oxide (DESITIN) 40 % ointment Apply topically daily as needed for irritation. 03/13/23   Pokhrel, Vernal, MD  magnesium  oxide (MAG-OX) 400 MG tablet Take 1 tablet (400 mg total) by mouth daily. Patient taking differently: Take 400 mg by mouth in the morning. 04/05/24   Rojelio Nest, DO  Multiple Vitamin (MULTI-VITAMIN) tablet Take 1 tablet by  mouth in the morning.    [provider]  polyethylene glycol (MIRALAX  / GLYCOLAX ) 17 g packet Take 17 g by mouth daily as needed for mild constipation. Patient taking differently: Take 17 g by mouth once a week. On Saturday 03/13/23   Sonjia Held, MD  rifaximin  (XIFAXAN ) 550 MG TABS tablet Take 1 tablet (550 mg total) by mouth 2 (two) times daily. Patient taking differently: Take 550 mg by mouth as needed. Flareups 12/25/23   Bryn Bernardino NOVAK, MD  spironolactone  (ALDACTONE ) 25 MG tablet Take 50 mg by mouth in the morning.    [provider]    No current facility-administered medications for this encounter.   No current outpatient medications on file.   Facility-Administered Medications Ordered in Other Encounters  Medication Dose Route Frequency Provider Last Rate Last Admin   acetaminophen  (TYLENOL ) tablet 650 mg  650 mg Oral Q6H PRN Howerter, Justin B, DO       Or   acetaminophen  (TYLENOL ) suppository 650 mg  650 mg Rectal Q6H PRN Howerter, Justin B, DO       aspirin  EC tablet 81 mg  81 mg Oral Daily Howerter, Justin B, DO   81 mg at 05/25/24 1047   Chlorhexidine  Gluconate Cloth 2 % PADS 6 each  6 each Topical Daily Gherghe, Costin M, MD   6 each at 05/25/24 1445   feeding supplement (ENSURE PLUS HIGH PROTEIN) liquid 237 mL  237 mL Oral BID BM Vann, Jessica U, DO   237 mL at 05/25/24 1049   furosemide  (LASIX ) injection 20 mg  20 mg  Intravenous Once Shahmehdi, Seyed A, MD       lactulose  (CHRONULAC ) 10 GM/15ML solution 30 g  30 g Oral Q6H Howerter, Justin B, DO   30 g at 05/25/24 1219   lactulose  (CHRONULAC ) enema 200 gm  300 mL Rectal BID Willette Jest A, MD   300 mL at 05/24/24 1602   levothyroxine  (SYNTHROID ) tablet 25 mcg  25 mcg Oral Q0600 Howerter, Justin B, DO   25 mcg at 05/25/24 9367   lip balm (CARMEX) ointment   Topical PRN Gherghe, Costin M, MD       melatonin tablet 3 mg  3 mg Oral QHS PRN Howerter, Justin B, DO       metoprolol tartrate (LOPRESSOR) injection 2.5 mg  2.5 mg Intravenous Q4H PRN Howerter, Justin B, DO       midodrine  (PROAMATINE ) tablet 10 mg  10 mg Oral TID WC Shahmehdi, Seyed A, MD       ondansetron  (ZOFRAN ) injection 4 mg  4 mg Intravenous Q6H PRN Howerter, Justin B, DO   4 mg at 05/21/24 1311   rifaximin  (XIFAXAN ) tablet 550 mg  550 mg Oral BID Gherghe, Costin M, MD   550 mg at 05/25/24 1047   tiZANidine (ZANAFLEX) tablet 2 mg  2 mg Oral TID Willette Jest A, MD   2 mg at 05/25/24 1047    Allergies as of 05/24/2024 - Review Complete 05/22/2024  Allergen Reaction Noted   Contrast media [iodinated contrast media] Hives 11/30/2011     Review of Systems:    Constitutional: No weight loss, fever or chills Skin: No rash  Cardiovascular: No chest pain Respiratory: No SOB Gastrointestinal: See HPI and otherwise negative Genitourinary: No dysuria Neurological: No headache, dizziness or syncope Musculoskeletal: No new muscle or joint pain Hematologic: No bleeding  Psychiatric: No history of depression or anxiety    Physical Exam:  Vital signs  in last 24 hours: Temp:  [96 F (35.6 C)-98.9 F (37.2 C)] 96.4 F (35.8 C) (07/03 1506) Pulse Rate:  [44-110] 55 (07/03 1506) Resp:  [9-20] 13 (07/03 1506) BP: (73-149)/(39-69) 77/44 (07/03 1506) SpO2:  [94 %-100 %] 98 % (07/03 1506) Weight:  [67.5 kg] 67.5 kg (07/03 0500)   General:   Pleasant chronically ill-appearing Caucasian female  appears to be in NAD, Well developed, Well nourished, lethargic Head:  Normocephalic and atraumatic. Eyes:   PEERL, EOMI. No icterus. Conjunctiva pink. Ears:  Normal auditory acuity. Neck:  Supple Throat: Oral cavity and pharynx without inflammation, swelling or lesion.  Lungs: Respirations even and unlabored. Lungs clear to auscultation bilaterally.   No wheezes, crackles, or rhonchi.  Heart: Normal S1, S2. No MRG. Regular rate and rhythm. No cyanosis or pallor. + Mild peripheral edema Abdomen:  Soft, nondistended, nontender. No rebound or guarding. Normal bowel sounds. No appreciable masses or hepatomegaly. Rectal:  Not performed.  Msk:  Symmetrical without gross deformities. Peripheral pulses intact.  Extremities:  Without edema, no deformity or joint abnormality.  Neurologic:  Alert and  oriented x4;  grossly normal neurologically. + Falls asleep while I am in the room Skin:   Dry and intact without significant lesions or rashes. Psychiatric: Demonstrates good judgement and reason without abnormal affect or behaviors.  LAB RESULTS: Recent Labs    05/23/24 0302 05/24/24 0318 05/25/24 0318 05/25/24 0918  WBC 5.1 4.4 3.0*  --   HGB 7.7* 7.8* 6.5* 6.8*  HCT 24.8* 25.2* 21.4* 21.9*  PLT 126* 137* 100*  --    BMET Recent Labs    05/23/24 0302 05/24/24 0318 05/25/24 0319  NA 140 142 139  K 3.6 3.9 3.6  CL 113* 115* 116*  CO2 21* 20* 21*  GLUCOSE 72 84 140*  BUN 27* 19 17  CREATININE 1.02* 0.92 0.74  CALCIUM  8.3* 8.3* 7.8*      Latest Ref Rng & Units 05/25/2024    3:19 AM 05/24/2024    3:18 AM 05/22/2024    2:55 AM  Hepatic Function  Total Protein 6.5 - 8.1 g/dL 4.7  5.5  5.5   Albumin  3.5 - 5.0 g/dL 1.8  2.3  2.3   AST 15 - 41 U/L 36  48  53   ALT 0 - 44 U/L 25  32  35   Alk Phosphatase 38 - 126 U/L 90  123  112   Total Bilirubin 0.0 - 1.2 mg/dL 0.8  1.3  1.6       STUDIES: CT HEAD WO CONTRAST ( ) Result Date: 05/24/2024 CLINICAL DATA:  Altered mental status.  EXAM: CT HEAD WITHOUT CONTRAST TECHNIQUE: Contiguous axial images were obtained from the base of the skull through the vertex without intravenous contrast. RADIATION DOSE REDUCTION: This exam was performed according to the departmental dose-optimization program which includes automated exposure control, adjustment of the mA and/or kV according to patient size and/or use of iterative reconstruction technique. COMPARISON:  Same day MRI head.  CT head 05/20/2024. FINDINGS: Brain: No acute intracranial hemorrhage. Evolving left PCA territory infarct. No additional areas of infarct appreciated on CT. Nonspecific hypoattenuation in the periventricular and subcortical white matter favored to reflect chronic microvascular ischemic changes. No edema, mass effect, or midline shift. The basilar cisterns are patent. No extra-axial fluid collections. Ventricles: The ventricles are normal. Vascular: No hyperdense vessel or unexpected calcification. Skull: No acute or aggressive finding. Orbits: Bilateral lens replacement.  Orbits otherwise unremarkable. Sinuses: The visualized  paranasal sinuses are clear. Other: Mastoid air cells are clear. IMPRESSION: Evolving left PCA territory infarct. No evidence of hemorrhagic conversion. Chronic microvascular ischemic changes. Electronically Signed   By: Donnice Mania M.D.   On: 05/24/2024 21:58   MR BRAIN WO CONTRAST Result Date: 05/24/2024 CLINICAL DATA:  Mental status change, unknown cause EXAM: MRI HEAD WITHOUT CONTRAST TECHNIQUE: Multiplanar, multiecho pulse sequences of the brain and surrounding structures were obtained without intravenous contrast. COMPARISON:  MRI head April 23, 2024. FINDINGS: Moderately motion limited study.  Within this limitation: Brain: Evolution of previously seen left PCA territory infarcts which demonstrates decreased conspicuity of restricted diffusion and decreased edema. No evidence of new/interval acute infarct on motion limited assessment. No evidence  of acute hemorrhage, mass lesion, midline shift or hydrocephalus. Similar remote right thalamic lacunar infarct and advanced scattered T2/FLAIR hyperintensities in the white matter, compatible with chronic microvascular ischemic disease. Vascular: Major arterial flow voids are maintained skull base. Skull and upper cervical spine: Normal marrow signal. Sinuses/Orbits: Negative. Other: None. IMPRESSION: 1. Expected evolution of left PCA infarcts without evidence of new/interval acute infarct, hemorrhage or progressive mass effect. 2. Similar advanced chronic microvascular ischemic disease. Electronically Signed   By: Gilmore GORMAN Molt M.D.   On: 05/24/2024 20:10   EEG adult Result Date: 05/24/2024 Shelton Arlin KIDD, MD     05/24/2024  5:38 PM Patient Name: Malyiah Fellows MRN: [id number] Epilepsy Attending: Arlin KIDD Shelton Referring Physician/Provider: Michaela Aisha SQUIBB, MD Date: 05/24/2024 Duration: 25.07 mins Patient history: 73yo F with ams. EEG to evaluate for seizure. Level of alertness: Awake AEDs during EEG study: None Technical aspects: This EEG study was done with scalp electrodes positioned according to the 10-20 International system of electrode placement. Electrical activity was reviewed with band pass filter of 1-70Hz , sensitivity of 7 uV/mm, display speed of 84mm/sec with a 60Hz  notched filter applied as appropriate. EEG data were recorded continuously and digitally stored.  Video monitoring was available and reviewed as appropriate. Description: The posterior dominant rhythm consists of 7.5 Hz activity of moderate voltage (25-35 uV) seen predominantly in posterior head regions, symmetric and reactive to eye opening and eye closing. EEG showed continuous generalized predominantly 5 to 6 Hz theta slowing admixed with intermittent 2-3hz  delta slowing. Hyperventilation and photic stimulation were not performed.    ABNORMALITY - Continuous slow, generalized  IMPRESSION: This study is suggestive of mild  to moderate diffuse encephalopathy. No seizures or epileptiform discharges were seen throughout the recording.  Priyanka KIDD Shelton     Impression / Plan:   Impression: 1.  Anemia with Hemoccult positive stools: On 05/21/2024 hemoglobin 8.1 -->7.8--> 6.5--> 6.8--> 2 units PRBCs ordered, iron low at 26, percent saturation low at 10, ferritin not done, per husband he thinks it is that the TIPS is blocked and she has developed a problem there; that her portal hypertensive gastropathy +/- varices versus other 2.  Noncirrhotic portal hypertension and suspected MASH cirrhosis: With esophageal varices status post variceal hemorrhage in 2012 status post TIPS, thrombocytopenia, hepatic encephalopathy with multiple readmissions for the same, also follows [facility] 3.  Embolic CVA 11/2023: Was initially on Plavix  for a month, now on a baby aspirin  4.  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  Will add a liver ultrasound with Doppler to recheck TIPS status. 2.  Also put patient on  for EGD potentially tomorrow for further evaluation of anemia.  Did discuss risk and benefits connotations and alternatives and patient agrees to proceed.  Pending her blood pressures in the morning this may have to be delayed. 3.  Patient be n.p.o. after midnight 4.  Continue other supportive measures including Lactulose  and Lasix  5.  Please await further recommendations from Dr. Albertus as this is a fairly complicated patient.  Thank you for your kind consultation, we will continue to follow.  Delon Gibson [facility]  05/25/2024, 3:46 PM

## 2024-05-25 NOTE — Progress Notes (Signed)
 PROGRESS NOTE    Patient: [name]                            PCP: [name], NP                    DOB: 1951/09/01            DOA: 05/20/2024 [id number]             DOS: 05/25/2024, 1:55 PM   LOS: 5 days   Date of Service: The patient was seen and examined on 05/25/2024  Subjective:   The patient was seen and examined this morning, was asleep, easily arousable, much more awake alert, following commands. But she is lethargic, hypotensive, noted drop in hemoglobin to 6.5  No signs of bleeding. Husband present at bedside reporting mentation is much improved  Brief Narrative:   [name] 73 year old female with liver cirrhosis, prior EV's and GI bleed, status post TIPS, PAF not on anticoagulation, hypothyroidism, anemia of chronic disease who comes into the hospital with increased confusion. She is being brought by her husband. She has had prior hospitalizations for hepatic encephalopathy. Husband reports that she has done this before, and has progressively gotten worse over the last week. She was found to have an ammonia level almost 200. She has no white count, no fever, clinically without signs of having an infection or GI bleed at this point     Assesement and Plan: Principal Problem:   Acute metabolic encephalopathy Active Problems:   AKI (acute kidney injury) (HCC)   Cirrhosis (HCC)   Acquired hypothyroidism   Hepatic encephalopathy (HCC)   Anemia of chronic disease   SIRS (systemic inflammatory response syndrome) (HCC)   Acute prerenal azotemia   Paroxysmal atrial fibrillation (HCC)     Principal problem:  Hepatic encephalopathy, underlying decompensated liver cirrhosis  - Much improved mentation, easily arousable, although lethargic mildly hypotensive, Following, awake alert oriented x 3  - Episode of neck stiffness, extremity stiffness has resolved -CT and MRI of the brain have been reviewed, no acute abnormalities, chronic changes -Neurology was  consulted, status post evaluation, stroke ruled out, EEG pending reading  -Husband present at bedside stating patient is at baseline  -S/p rectal enema lactulose --- will switch to p.o. today 05/25/2024 -Hemodynamically stable ammonia level low at 61>>> 48  POA: progressive confusion over the past week, currently presentation consistent with hepatic encephalopathy. a  -Husband does not want NG tube so we will continue enemas - No evidence of an acute infectious process, no evidence of GI bleed  - Continue neurochecks   Active problems  Paroxysmal A-fib-stable, rate controlled, not on anticoagulation due to prior GI bleed   Anemia of liver disease-no evidence of GI bleed,  - S/p 1U PRBC transfusion 05/23/2024 - No signs of bleeding, transfusing 2U PRBC 05/25/2024 - Follow-up iron studies, B12, folate level    Latest Ref Rng & Units 05/25/2024    9:18 AM 05/25/2024    3:18 AM 05/24/2024    3:18 AM  CBC  WBC 4.0 - 10.5 K/uL  3.0  4.4   Hemoglobin 12.0 - 15.0 g/dL 6.8  6.5  7.8   Hematocrit 36.0 - 46.0 % 21.9  21.4  25.2   Platelets 150 - 400 K/uL  100  137      Hypotensive  Due to acute on chronic anemia - S/p bolus IVF, transfusing 2U  PRBC  hypothyroidism-continue Synthroid .  TSH normal   AKI -prior creatinine earlier this month 0.7 -IVF while not eating   Hypernatremia -IVF- D5 half-normal saline-discontinued, encouraging oral intake     ----------------------------------------------------------------------------------------------------------------------------------------------- Nutritional status:  The patient's BMI is: Body mass index is 26.87 kg/m. I agree with the assessment and plan as outlined  ------------------------------------------------------------------------------------------------------------------------------------------------  DVT prophylaxis:  SCDs Start: 05/20/24 2134   Code Status:   Code Status: Full Code  Family Communication: Husband present at  bedside, discussed the findings, plan of care -Advance care planning has been discussed.   Admission status:   Status is: Inpatient Remains inpatient appropriate because: Needing close observation, as needed blood transfusion, rectal lactulose , continues evaluation for encephalopathy   Disposition: From  - home             Planning for discharge in 2 days: to   Procedures:   No admission procedures for hospital encounter.   Antimicrobials:  Anti-infectives (From admission, onward)    Start     Dose/Rate Route Frequency Ordered Stop   05/21/24 1000  rifaximin  (XIFAXAN ) tablet 550 mg        550 mg Oral 2 times daily 05/21/24 0705          Medication:   aspirin  EC  81 mg Oral Daily   Chlorhexidine  Gluconate Cloth  6 each Topical Daily   feeding supplement  237 mL Oral BID BM   furosemide   20 mg Intravenous Once   lactulose   30 g Oral Q6H   lactulose   300 mL Rectal BID   levothyroxine   25 mcg Oral Q0600   rifaximin   550 mg Oral BID   tiZANidine  2 mg Oral TID    acetaminophen  **OR** acetaminophen , lip balm, melatonin, metoprolol tartrate, ondansetron  (ZOFRAN ) IV   Objective:   Vitals:   05/25/24 1019 05/25/24 1209 05/25/24 1245 05/25/24 1300  BP: 98/61 (!) 113/47 (!) 91/59 (!) 98/52  Pulse: 63 67 60 (!) 59  Resp: 12 14 14 11   Temp: (!) 97.2 F (36.2 C) (!) 96.6 F (35.9 C) 98.9 F (37.2 C) (!) 97.3 F (36.3 C)  TempSrc: Axillary Axillary Axillary Oral  SpO2: 100% 98% 98% 98%  Weight:      Height:        Intake/Output Summary (Last 24 hours) at 05/25/2024 1355 Last data filed at 05/25/2024 1209 Gross per 24 hour  Intake 3519.23 ml  Output 430 ml  Net 3089.23 ml   Filed Weights   05/20/24 1516 05/21/24 1107 05/25/24 0500  Weight: 68 kg 67.3 kg 67.5 kg     Physical examination:    General:  AAO x 3,  cooperative, no distress;   HEENT:  Normocephalic, PERRL, otherwise with in Normal limits   Neuro:  CNII-XII intact. , normal motor and sensation,  reflexes intact   Lungs:   Clear to auscultation BL, Respirations unlabored,  No wheezes / crackles  Cardio:    S1/S2, RRR, No murmure, No Rubs or Gallops   Abdomen:  Soft, non-tender, bowel sounds active all four quadrants, no guarding or peritoneal signs.  Muscular  skeletal:  Limited exam -global generalized weaknesses - in bed, able to move all 4 extremities,   2+ pulses,  symmetric, No pitting edema  Skin:  Dry, warm to touch, negative for any Rashes,  Wounds: Please see nursing documentation     ------------------------------------------------------------------------------------------------------------------------------    LABs:     Latest Ref Rng & Units 05/25/2024    9:18  AM 05/25/2024    3:18 AM 05/24/2024    3:18 AM  CBC  WBC 4.0 - 10.5 K/uL  3.0  4.4   Hemoglobin 12.0 - 15.0 g/dL 6.8  6.5  7.8   Hematocrit 36.0 - 46.0 % 21.9  21.4  25.2   Platelets 150 - 400 K/uL  100  137       Latest Ref Rng & Units 05/25/2024    3:19 AM 05/24/2024    3:18 AM 05/23/2024    3:02 AM  CMP  Glucose 70 - 99 mg/dL 859  84  72   BUN 8 - 23 mg/dL 17  19  27    Creatinine 0.44 - 1.00 mg/dL 9.25  9.07  8.97   Sodium 135 - 145 mmol/L 139  142  140   Potassium 3.5 - 5.1 mmol/L 3.6  3.9  3.6   Chloride 98 - 111 mmol/L 116  115  113   CO2 22 - 32 mmol/L 21  20  21    Calcium  8.9 - 10.3 mg/dL 7.8  8.3  8.3   Total Protein 6.5 - 8.1 g/dL 4.7  5.5    Total Bilirubin 0.0 - 1.2 mg/dL 0.8  1.3    Alkaline Phos 38 - 126 U/L 90  123    AST 15 - 41 U/L 36  48    ALT 0 - 44 U/L 25  32         Micro Results Recent Results (from the past 240 hours)  MRSA Next Gen by PCR, Nasal     Status: None   Collection Time: 05/21/24  1:27 PM   Specimen: Nasal Mucosa; Nasal Swab  Result Value Ref Range Status   MRSA by PCR Next Gen NOT DETECTED NOT DETECTED Final    Comment: (NOTE) The GeneXpert MRSA Assay (FDA approved for NASAL specimens only), is one component of a comprehensive MRSA colonization  surveillance program. It is not intended to diagnose MRSA infection nor to guide or monitor treatment for MRSA infections. Test performance is not FDA approved in patients less than 44 years old. Performed at [facility], [address] [place], [place] [address]     Radiology Reports CT HEAD WO CONTRAST ( ) Result Date: 05/24/2024 CLINICAL DATA:  Altered mental status. EXAM: CT HEAD WITHOUT CONTRAST TECHNIQUE: Contiguous axial images were obtained from the base of the skull through the vertex without intravenous contrast. RADIATION DOSE REDUCTION: This exam was performed according to the departmental dose-optimization program which includes automated exposure control, adjustment of the mA and/or kV according to patient size and/or use of iterative reconstruction technique. COMPARISON:  Same day MRI head.  CT head 05/20/2024. FINDINGS: Brain: No acute intracranial hemorrhage. Evolving left PCA territory infarct. No additional areas of infarct appreciated on CT. Nonspecific hypoattenuation in the periventricular and subcortical white matter favored to reflect chronic microvascular ischemic changes. No edema, mass effect, or midline shift. The basilar cisterns are patent. No extra-axial fluid collections. Ventricles: The ventricles are normal. Vascular: No hyperdense vessel or unexpected calcification. Skull: No acute or aggressive finding. Orbits: Bilateral lens replacement.  Orbits otherwise unremarkable. Sinuses: The visualized paranasal sinuses are clear. Other: Mastoid air cells are clear. IMPRESSION: Evolving left PCA territory infarct. No evidence of hemorrhagic conversion. Chronic microvascular ischemic changes. Electronically Signed   By: Donnice Mania M.D.   On: 05/24/2024 21:58   MR BRAIN WO CONTRAST Result Date: 05/24/2024 CLINICAL DATA:  Mental status change, unknown cause EXAM: MRI  HEAD WITHOUT CONTRAST TECHNIQUE: Multiplanar, multiecho pulse sequences of the brain and  surrounding structures were obtained without intravenous contrast. COMPARISON:  MRI head April 23, 2024. FINDINGS: Moderately motion limited study.  Within this limitation: Brain: Evolution of previously seen left PCA territory infarcts which demonstrates decreased conspicuity of restricted diffusion and decreased edema. No evidence of new/interval acute infarct on motion limited assessment. No evidence of acute hemorrhage, mass lesion, midline shift or hydrocephalus. Similar remote right thalamic lacunar infarct and advanced scattered T2/FLAIR hyperintensities in the white matter, compatible with chronic microvascular ischemic disease. Vascular: Major arterial flow voids are maintained skull base. Skull and upper cervical spine: Normal marrow signal. Sinuses/Orbits: Negative. Other: None. IMPRESSION: 1. Expected evolution of left PCA infarcts without evidence of new/interval acute infarct, hemorrhage or progressive mass effect. 2. Similar advanced chronic microvascular ischemic disease. Electronically Signed   By: Gilmore GORMAN Molt M.D.   On: 05/24/2024 20:10   EEG adult Result Date: 05/24/2024 Shelton Arlin KIDD, MD     05/24/2024  5:38 PM Patient Name: Deangela Randleman MRN: [id number] Epilepsy Attending: Arlin KIDD Shelton Referring Physician/Provider: Michaela Aisha SQUIBB, MD Date: 05/24/2024 Duration: 25.07 mins Patient history: 73yo F with ams. EEG to evaluate for seizure. Level of alertness: Awake AEDs during EEG study: None Technical aspects: This EEG study was done with scalp electrodes positioned according to the 10-20 International system of electrode placement. Electrical activity was reviewed with band pass filter of 1-70Hz , sensitivity of 7 uV/mm, display speed of 27mm/sec with a 60Hz  notched filter applied as appropriate. EEG data were recorded continuously and digitally stored.  Video monitoring was available and reviewed as appropriate. Description: The posterior dominant rhythm consists of 7.5 Hz activity  of moderate voltage (25-35 uV) seen predominantly in posterior head regions, symmetric and reactive to eye opening and eye closing. EEG showed continuous generalized predominantly 5 to 6 Hz theta slowing admixed with intermittent 2-3hz  delta slowing. Hyperventilation and photic stimulation were not performed.    ABNORMALITY - Continuous slow, generalized  IMPRESSION: This study is suggestive of mild to moderate diffuse encephalopathy. No seizures or epileptiform discharges were seen throughout the recording.  Priyanka O Yadav    SIGNED: Adriana DELENA Grams, MD, FHM. FAAFP. [facility] - [facility] Critical care time spent - 55 min.  In seeing, evaluating and examining the patient. Reviewing medical records, labs, drawn plan of care. [facility],  Pager (please use [email or web address] to page/ text) Please use Epic Secure Chat for non-urgent communication (7AM-7PM)  If 7PM-7AM, please contact night-coverage [email or web address], 05/25/2024, 1:55 PM

## 2024-05-25 NOTE — Consult Note (Signed)
 [facility] Psychiatric Consult Initial  Patient Name: .[name]  MRN: [id number]  DOB: 02-19-1951  Consult Order details:  Orders (From admission, onward)     Start     Ordered   05/24/24 1308  IP CONSULT TO PSYCHIATRY       Ordering Provider: Willette Adriana LABOR, MD  Provider:  (Not yet assigned)  Question Answer Comment  Location [facility]   Reason for Consult? [place]      05/24/24 1307             Mode of Visit: In person    Psychiatry Consult Evaluation  Service Date: May 25, 2024 LOS:  LOS: 5 days  Chief Complaint [facility]  Primary Psychiatric Diagnoses  Delirium   Assessment  [name] is a 73 y.o. female admitted: Medicallyfor 05/20/2024  3:12 PM per Dr. Marcene on admission:   73 y.o. female with medical history significant for cirrhosis complicated by oral hypertension with history of esophageal varices, status post TIPS procedure, paroxysmal atrial fibrillation not on chronic anticoagulation, acquired hypothyroidism, anemia of chronic disease associated baseline hemoglobin 7-11, who is admitted to [facility] on 05/20/2024 with suspected acute metabolic encephalopathy after presenting from home to [facility] ED for evaluation of altered mental status.   Her current presentation of fluctuating cognition is most consistent with delirium.   Current outpatient psychotropic medications include none. On initial examination, patient was responsive with her spouse communicating most of the information, see below. Please see plan below for detailed recommendations.   Diagnoses:  Active Hospital problems: Principal Problem:   Acute metabolic encephalopathy Active Problems:   Acquired hypothyroidism   Cirrhosis (HCC)   Hepatic encephalopathy (HCC)   AKI (acute kidney injury) (HCC)   Anemia of chronic disease   SIRS (systemic inflammatory response syndrome) (HCC)   Acute prerenal azotemia   Paroxysmal atrial fibrillation (HCC)     Plan   ## Psychiatric Medication Recommendations:  Psych cleared, no psych needs per husband and patient  ## Medical Decision Making Capacity: Not specifically addressed in this encounter  ## Further Work-up:  -- most recent EKG on 05/20/2024 had QtC of 488 -- Pertinent labwork reviewed earlier this admission includes: CBC, chem panel, anemia panel, EKG   ## Disposition:-- There are no psychiatric contraindications to discharge at this time  ## Behavioral / Environmental: - No specific recommendations at this time.     ## Safety and Observation Level:  - Based on my clinical evaluation, I estimate the patient to be at minimal risk of self harm in the current setting. - At this time, we recommend  routine. This decision is based on my review of the chart including patient's history and current presentation, interview of the patient, mental status examination, and consideration of suicide risk including evaluating suicidal ideation, plan, intent, suicidal or self-harm behaviors, risk factors, and protective factors. This judgment is based on our ability to directly address suicide risk, implement suicide prevention strategies, and develop a safety plan while the patient is in the clinical setting. Please contact our team if there is a concern that risk level has changed.  CSSR Risk Category:C-SSRS RISK CATEGORY: No Risk  Suicide Risk Assessment: Patient has following modifiable risk factors for suicide: none. Patient has following non-modifiable or demographic risk factors for suicide: none Patient has the following protective factors against suicide: Supportive family, Supportive friends, and no history of suicide attempts  Thank you for this consult request. Recommendations have been  communicated to the primary team.  We will sign off at this time.   Sharlot Becker, NP       History of Present Illness  Relevant Aspects of [facility] Course:  Admitted on 05/20/2024 fper Dr.  Marcene on admission:   73 y.o. female with medical history significant for cirrhosis complicated by oral hypertension with history of esophageal varices, status post TIPS procedure, paroxysmal atrial fibrillation not on chronic anticoagulation, acquired hypothyroidism, anemia of chronic disease associated baseline hemoglobin 7-11, who is admitted to [facility] on 05/20/2024 with suspected acute metabolic encephalopathy after presenting from home to [facility] ED for evaluation of altered mental status.   Patient Report:  73 yo female admitted with AMS with a high ammonia level.  Her husband at her beside reported this is the normal course for her when her ammonia levels increase, she typically sleeps for 3-4 days and then comes out of it.  This time her level was higher than normal and she came out of it yesterday with unusual behaviors, like a defiant child refusing to engage.  Last night, her symptoms resolved with her talking in full sentences about their anniversary plans.  He stated, She's waking up, she's coming out of it.  There is usually one bad day when this occurs and it was yesterday.  Denies any psychiatric needs and stated We're alright.  No suicidal ideations, distress, anxiety, depression, or substance abuse.   Psych ROS:  Depression: none Anxiety:  none Mania (lifetime and current): none Psychosis: (lifetime and current): none  Collateral information:  Contacted husband at her bedside on 05/25/2024.  Review of Systems  Constitutional:  Positive for malaise/fatigue.     Psychiatric and Social History  Psychiatric History:  Information collected from patient, husband and chart.  Prev Dx/Sx: none Current Psych Provider: none Home Meds (current): no psych meds Previous Med Trials: none Therapy: none  Prior Psych Hospitalization: none  Prior Self Harm: none Prior Violence: none  Family Psych History: none Family Hx suicide: none  Social History:  Retired, lives  with her husband Access to weapons/lethal means: none   Substance History None on record, no current use  Exam Findings  Physical Exam: completed by the MD, reviewed. Vital Signs:  Temp:  [96 F (35.6 C)-97.6 F (36.4 C)] 97.2 F (36.2 C) (07/03 1019) Pulse Rate:  [44-110] 63 (07/03 1019) Resp:  [9-20] 12 (07/03 1019) BP: (80-149)/(39-69) 98/61 (07/03 1019) SpO2:  [94 %-100 %] 100 % (07/03 1019) Weight:  [67.5 kg] 67.5 kg (07/03 0500) Blood pressure 98/61, pulse 63, temperature (!) 97.2 F (36.2 C), temperature source Axillary, resp. rate 12, height 5' 2.4 (1.585 m), weight 67.5 kg, SpO2 100%. Body mass index is 26.87 kg/m.  Physical Exam Vitals and nursing note reviewed.  HENT:     Head: Normocephalic.     Nose: Nose normal.  Pulmonary:     Effort: Pulmonary effort is normal.  Neurological:     Mental Status: She is alert.     Mental Status Exam: General Appearance: Casual  Orientation:  Other:  person  Memory:  Immediate;   Fair Recent;   Fair Remote;   Fair  Concentration:  Concentration: Fair and Attention Span: Fair  Recall:  Fair  Attention  Fair  Eye Contact:  Fair  Speech:  Slow  Language:  Fair  Volume:  Decreased  Mood: denies depression and anxiety  Affect:  Blunt  Thought Process:  Coherent  Thought Content:  Logical  Suicidal Thoughts:  No  Homicidal Thoughts:  No  Judgement:  Fair  Insight:  Fair  Psychomotor Activity:  Decreased  Akathisia:  No  Fund of Knowledge:  Fair      Assets:  Housing Leisure Time Resilience Social Support  Cognition:  Impaired,  Mild  ADL's:  Impaired  AIMS (if indicated):        Other History   These have been pulled in through the EMR, reviewed, and updated if appropriate.  Family History:  The patient's family history includes Arthritis in her father and mother; Cancer in her maternal aunt; Diabetes in her father; Hearing loss in her mother; Heart disease in her father and mother; Hypertension in her  mother; Miscarriages / [place] in her mother.  Medical History: Past Medical History:  Diagnosis Date   Acute hepatic encephalopathy (HCC) 03/30/2024   Acute urinary retention 05/04/2022   Allergy 2006 ?   Contrast dye   Arthritis 2016 ??   Knees and thumb   Cancer (HCC)    cecum   Cataract 2021   Surgery scheduled July 2023   Colon cancer [facility]) 2003   Elevated liver function tests    Esophageal varices (HCC)    Heart murmur On file   Hemorrhage of gastrointestinal tract 05/04/2011   Hypertension 2021   Hypothyroidism    Iron deficiency anemia    Liver disease    chemotherapy complication, per pt, shunts placed to bypass liver   Malignant neoplasm of cecum (HCC)    Portal hypertension (HCC)    Skin cancer 2019   Splenomegaly     Surgical History: Past Surgical History:  Procedure Laterality Date   COLON SURGERY  2004   Cancer   COSMETIC SURGERY  2021   Skin cancer   ESOPHAGEAL VARICE LIGATION     EYE SURGERY     HEMICOLECTOMY  01/08/2003   IR RADIOLOGIST EVAL & MGMT  12/20/2020   IR RADIOLOGIST EVAL & MGMT  05/29/2021   LIVER SURGERY     shunts placed after chemo complication   SKIN FULL THICKNESS GRAFT N/A 09/12/2019   Procedure: debridement and FTSG to the nose from left upper arm;  Surgeon: Elisabeth Craig RAMAN, MD;  Location: [facility];  Service: Plastics;  Laterality: N/A;  2 hours, please   TIPS PROCEDURE       Medications:   Current Facility-Administered Medications:    0.9 %  sodium chloride  infusion (Manually program via Guardrails IV Fluids), , Intravenous, Once, Shahmehdi, Seyed A, MD   acetaminophen  (TYLENOL ) tablet 650 mg, 650 mg, Oral, Q6H PRN **OR** acetaminophen  (TYLENOL ) suppository 650 mg, 650 mg, Rectal, Q6H PRN, Howerter, Justin B, DO   aspirin  EC tablet 81 mg, 81 mg, Oral, Daily, Howerter, Justin B, DO, 81 mg at 05/25/24 1047   Chlorhexidine  Gluconate Cloth 2 % PADS 6 each, 6 each, Topical, Daily, Gherghe, Costin M, MD, 6  each at 05/24/24 1623   dextrose  5 % and 0.45 % NaCl infusion, , Intravenous, Continuous, Shahmehdi, Seyed A, MD, Last Rate: 100 mL/hr at 05/25/24 0700, Infusion Verify at 05/25/24 0700   feeding supplement (ENSURE PLUS HIGH PROTEIN) liquid 237 mL, 237 mL, Oral, BID BM, Vann, Jessica U, DO, 237 mL at 05/25/24 1049   furosemide  (LASIX ) injection 20 mg, 20 mg, Intravenous, Once, Shahmehdi, Seyed A, MD   furosemide  (LASIX ) injection 20 mg, 20 mg, Intravenous, Once, Shahmehdi, Seyed A, MD   lactulose  (CHRONULAC ) 10 GM/15ML solution 30 g, 30  g, Oral, Q6H, Howerter, Justin B, DO, 30 g at 05/25/24 9367   lactulose  (CHRONULAC ) enema 200 gm, 300 mL, Rectal, BID, Shahmehdi, Seyed A, MD, 300 mL at 05/24/24 1602   levothyroxine  (SYNTHROID ) tablet 25 mcg, 25 mcg, Oral, Q0600, Howerter, Justin B, DO, 25 mcg at 05/25/24 0632   lip balm (CARMEX) ointment, , Topical, PRN, Gherghe, Costin M, MD   melatonin tablet 3 mg, 3 mg, Oral, QHS PRN, Howerter, Justin B, DO   metoprolol tartrate (LOPRESSOR) injection 2.5 mg, 2.5 mg, Intravenous, Q4H PRN, Howerter, Justin B, DO   ondansetron  (ZOFRAN ) injection 4 mg, 4 mg, Intravenous, Q6H PRN, Howerter, Justin B, DO, 4 mg at 05/21/24 1311   rifaximin  (XIFAXAN ) tablet 550 mg, 550 mg, Oral, BID, Gherghe, Costin M, MD, 550 mg at 05/25/24 1047   tiZANidine (ZANAFLEX) tablet 2 mg, 2 mg, Oral, TID, Shahmehdi, Seyed A, MD, 2 mg at 05/25/24 1047  Allergies: Allergies  Allergen Reactions   Contrast Media [Iodinated Contrast Media] Hives    Sharlot Becker, NP

## 2024-05-25 NOTE — Progress Notes (Signed)
 0100 - Pt having hypotension 88/46 (56 MAP).  Attempted to stimulate but with no increase in BP.  Andrez, NP on call, notified.  Small bolus ordered.  0215 - BP 97/57 (67) after patient awakened and appropriately answered orientation questions.  Will monitor and update provider

## 2024-05-26 DIAGNOSIS — E876 Hypokalemia: Secondary | ICD-10-CM

## 2024-05-26 DIAGNOSIS — G9341 Metabolic encephalopathy: Secondary | ICD-10-CM | POA: Diagnosis not present

## 2024-05-26 LAB — CBC
HCT: 31.2 % — ABNORMAL LOW (ref 36.0–46.0)
Hemoglobin: 10 g/dL — ABNORMAL LOW (ref 12.0–15.0)
MCH: 31.6 pg (ref 26.0–34.0)
MCHC: 32.1 g/dL (ref 30.0–36.0)
MCV: 98.7 fL (ref 80.0–100.0)
Platelets: 125 K/uL — ABNORMAL LOW (ref 150–400)
RBC: 3.16 MIL/uL — ABNORMAL LOW (ref 3.87–5.11)
RDW: 19.4 % — ABNORMAL HIGH (ref 11.5–15.5)
WBC: 3.2 K/uL — ABNORMAL LOW (ref 4.0–10.5)
nRBC: 0 % (ref 0.0–0.2)

## 2024-05-26 LAB — COMPREHENSIVE METABOLIC PANEL WITH GFR
ALT: 28 U/L (ref 0–44)
AST: 42 U/L — ABNORMAL HIGH (ref 15–41)
Albumin: 2.1 g/dL — ABNORMAL LOW (ref 3.5–5.0)
Alkaline Phosphatase: 117 U/L (ref 38–126)
Anion gap: 8 (ref 5–15)
BUN: 15 mg/dL (ref 8–23)
CO2: 21 mmol/L — ABNORMAL LOW (ref 22–32)
Calcium: 8.3 mg/dL — ABNORMAL LOW (ref 8.9–10.3)
Chloride: 108 mmol/L (ref 98–111)
Creatinine, Ser: 0.87 mg/dL (ref 0.44–1.00)
GFR, Estimated: >60 mL/min (ref >60)
Glucose, Bld: 84 mg/dL (ref 70–99)
Potassium: 3.2 mmol/L — ABNORMAL LOW (ref 3.5–5.1)
Sodium: 137 mmol/L (ref 135–145)
Total Bilirubin: 1.2 mg/dL (ref 0.0–1.2)
Total Protein: 5.1 g/dL — ABNORMAL LOW (ref 6.5–8.1)

## 2024-05-26 LAB — AMMONIA: Ammonia: 43 umol/L — ABNORMAL HIGH (ref 9–35)

## 2024-05-26 MED ORDER — POTASSIUM CHLORIDE CRYS ER 20 MEQ PO TBCR
40.0000 meq | EXTENDED_RELEASE_TABLET | Freq: Once | ORAL | Status: AC
  Administered 2024-05-26: 40 meq via ORAL
  Filled 2024-05-26: qty 2

## 2024-05-26 MED ORDER — PANTOPRAZOLE SODIUM 40 MG PO TBEC
40.0000 mg | DELAYED_RELEASE_TABLET | Freq: Every day | ORAL | Status: DC
  Administered 2024-05-26 – 2024-05-27 (×2): 40 mg via ORAL
  Filled 2024-05-26 (×2): qty 1

## 2024-05-26 MED ORDER — FERROUS SULFATE 325 (65 FE) MG PO TABS
325.0000 mg | ORAL_TABLET | Freq: Two times a day (BID) | ORAL | Status: DC
  Administered 2024-05-26 – 2024-05-29 (×5): 325 mg via ORAL
  Filled 2024-05-26 (×7): qty 1

## 2024-05-26 NOTE — Plan of Care (Signed)

## 2024-05-26 NOTE — Progress Notes (Signed)
 Progress Note   Assessment    73 year old female with a history of MASH cirrhosis, oxaliplatin induced portal hypertension, history of variceal hemorrhage status post TIPS in 2014, recurrent hepatic encephalopathy on lactulose  at home admitted with recurrent hepatic encephalopathy and declining hemoglobin without overt bleeding  Principal Problem:   Acute metabolic encephalopathy Active Problems:   Acquired hypothyroidism   Cirrhosis (HCC)   Hepatic encephalopathy (HCC)   AKI (acute kidney injury) (HCC)   Anemia of chronic disease   SIRS (systemic inflammatory response syndrome) (HCC)   Acute prerenal azotemia   Paroxysmal atrial fibrillation (HCC)   Recommendations   Decompensated MASH cirrhosis/history of oxaliplatin induced portal hypertension; complicated by recurrent portosystemic encephalopathy -- Improving encephalopathy on lactulose  and rifaximin ; rifaximin  had been cost prohibitive at home -- Unclear precipitant though concern was possibly for nonhemodynamically significant GI bleed -- Ultrasound negative for [facility] and TIPS shown to be patent with velocities actually reduced from prior ultrasound  1.  Hepatic encephalopathy --improving; unclear precipitant rule out GI bleeding -- Continue lactulose  -- Continue rifaximin ; may want to try to obtain this again either with assistance, see if it is now covered by insurance or consider purchasing from [place] --given that she has been hospitalized 3 times this year for this issue  2.  Declining hemoglobin --TIPS is patent and so should not have variceal bleeding and there is no clinical evidence of variceal bleed.  Possible portal gastropathy though that 2 should be improved with TIPS.  More than expected response to 2 units of red cells with hemoglobin going from 6.8-10 point -- Had considered EGD today but do this is not emergent or urgent -- Follow-up hemoglobin tomorrow; will make n.p.o. in case we decide to do EGD; EGD not  unreasonable before discharge -- Repeat hemoglobin in the morning  3.  Mild hypokalemia --replace per primary team   Chief Complaint   Patient feeling some better this morning Husband at bedside No overnight events  Robust response to 2 units of red cells; hemoglobin 6.8-10.0 No overt bloody stool in rectal tube with lactulose  Plans to work with PT today Ultrasound done late last night  Vital signs in last 24 hours: Temp:  [94.4 F (34.7 C)-98.9 F (37.2 C)] 97.5 F (36.4 C) (07/04 0800) Pulse Rate:  [50-68] 60 (07/04 0839) Resp:  [10-27] 12 (07/04 0839) BP: (73-167)/(39-124) 103/48 (07/04 0800) SpO2:  [94 %-100 %] 94 % (07/04 0839) Weight:  [73.2 kg] 73.2 kg (07/04 0500) Last BM Date : 05/26/24  General: Alert, well-developed, in NAD Heart:  Regular rate and rhythm; no murmurs Abdomen:  Soft, nontender and nondistended. Normal bowel sounds, without guarding, and without rebound.   Extremities: 1+ lower extremity edema Neurologic: Alert and cooperative, mild asterixis, mentation sharper today.  Intake/Output from previous day: 07/03 0701 - 07/04 0700 In: 1778.4 [I.V.:862.5; Blood:885.8] Out: 1470 [Urine:1200; Stool:270] Intake/Output this shift: Total I/O In: 120 [Other:120] Out: 40 [Stool:40]  Lab Results: Recent Labs    05/24/24 0318 05/25/24 0318 05/25/24 0918 05/25/24 1706 05/26/24 0326  WBC 4.4 3.0*  --   --  3.2*  HGB 7.8* 6.5* 6.8* 9.5* 10.0*  HCT 25.2* 21.4* 21.9* 29.6* 31.2*  PLT 137* 100*  --   --  125*   BMET Recent Labs    05/24/24 0318 05/25/24 0319 05/26/24 0327  NA 142 139 137  K 3.9 3.6 3.2*  CL 115* 116* 108  CO2 20* 21* 21*  GLUCOSE 84 140* 84  BUN 19 17 15   CREATININE 0.92 0.74 0.87  CALCIUM  8.3* 7.8* 8.3*   LFT Recent Labs    05/26/24 0327  PROT 5.1*  ALBUMIN  2.1*  AST 42*  ALT 28  ALKPHOS 117  BILITOT 1.2    Studies/Results: US  ABDOMEN LIMITED WITH LIVER DOPPLER Result Date: 05/26/2024 CLINICAL DATA:  Cirrhosis  with ascites. EXAM: DUPLEX ULTRASOUND OF LIVER TECHNIQUE: Color and duplex Doppler ultrasound was performed to evaluate the hepatic in-flow and out-flow vessels. COMPARISON:  Ultrasound 03/31/2024 FINDINGS: Liver: Coarsened hepatic echotexture No focal lesion, mass or intrahepatic biliary ductal dilatation. Gallbladder: No sonographic Murphy's sign. No wall thickening. Cholelithiasis. Portal Vein Velocities Main Prox:  21.9 cm/sec; previously 70 centimeters/second Right: 30.0 cm/sec; previously says 71 centimeters/second Left: 27.6 cm/sec; previously 41 centimeters/second TIPS Proximal (portal): 53.9 cm/s;  previously 74 cm/s MID: 57.1 cm/s; previously 78 cm/s Distal (hepatic vein): 36.4 cm/s; previously 96 cm/s Hepatic Vein Velocities Right:  16.9 cm/sec; Middle:  20.8 cm/sec; Left:  15.8 cm/sec; IVC: Present and patent with normal respiratory phasicity. Hepatic Artery Velocity:  63.2 cm/sec Splenic Vein Velocity:  24.9 cm/sec Spleen: 11.2 cm x 4.6 cm x 3.5 cm Portal Vein Occlusion/Thrombus: No Splenic Vein Occlusion/Thrombus: No Ascites: None Varices: None IMPRESSION: 1. Patent TIPS. Decreased velocities in the portal vein and tips compared to prior 2. Cholelithiasis. Electronically Signed   By: Norman Gatlin M.D.   On: 05/26/2024 00:03   US  EKG SITE RITE Result Date: 05/25/2024 If Site Rite image not attached, placement could not be confirmed due to current cardiac rhythm.  CT HEAD WO CONTRAST ( ) Result Date: 05/24/2024 CLINICAL DATA:  Altered mental status. EXAM: CT HEAD WITHOUT CONTRAST TECHNIQUE: Contiguous axial images were obtained from the base of the skull through the vertex without intravenous contrast. RADIATION DOSE REDUCTION: This exam was performed according to the departmental dose-optimization program which includes automated exposure control, adjustment of the mA and/or kV according to patient size and/or use of iterative reconstruction technique. COMPARISON:  Same day MRI head.  CT head  05/20/2024. FINDINGS: Brain: No acute intracranial hemorrhage. Evolving left PCA territory infarct. No additional areas of infarct appreciated on CT. Nonspecific hypoattenuation in the periventricular and subcortical white matter favored to reflect chronic microvascular ischemic changes. No edema, mass effect, or midline shift. The basilar cisterns are patent. No extra-axial fluid collections. Ventricles: The ventricles are normal. Vascular: No hyperdense vessel or unexpected calcification. Skull: No acute or aggressive finding. Orbits: Bilateral lens replacement.  Orbits otherwise unremarkable. Sinuses: The visualized paranasal sinuses are clear. Other: Mastoid air cells are clear. IMPRESSION: Evolving left PCA territory infarct. No evidence of hemorrhagic conversion. Chronic microvascular ischemic changes. Electronically Signed   By: Donnice Mania M.D.   On: 05/24/2024 21:58   MR BRAIN WO CONTRAST Result Date: 05/24/2024 CLINICAL DATA:  Mental status change, unknown cause EXAM: MRI HEAD WITHOUT CONTRAST TECHNIQUE: Multiplanar, multiecho pulse sequences of the brain and surrounding structures were obtained without intravenous contrast. COMPARISON:  MRI head April 23, 2024. FINDINGS: Moderately motion limited study.  Within this limitation: Brain: Evolution of previously seen left PCA territory infarcts which demonstrates decreased conspicuity of restricted diffusion and decreased edema. No evidence of new/interval acute infarct on motion limited assessment. No evidence of acute hemorrhage, mass lesion, midline shift or hydrocephalus. Similar remote right thalamic lacunar infarct and advanced scattered T2/FLAIR hyperintensities in the white matter, compatible with chronic microvascular ischemic disease. Vascular: Major arterial flow voids are maintained skull base. Skull and upper cervical spine:  Normal marrow signal. Sinuses/Orbits: Negative. Other: None. IMPRESSION: 1. Expected evolution of left PCA infarcts  without evidence of new/interval acute infarct, hemorrhage or progressive mass effect. 2. Similar advanced chronic microvascular ischemic disease. Electronically Signed   By: Gilmore GORMAN Molt M.D.   On: 05/24/2024 20:10   EEG adult Result Date: 05/24/2024 Shelton Arlin KIDD, MD     05/24/2024  5:38 PM Patient Name: Sahra Converse MRN: [id number] Epilepsy Attending: Arlin KIDD Shelton Referring Physician/Provider: Michaela Aisha SQUIBB, MD Date: 05/24/2024 Duration: 25.07 mins Patient history: 73yo F with ams. EEG to evaluate for seizure. Level of alertness: Awake AEDs during EEG study: None Technical aspects: This EEG study was done with scalp electrodes positioned according to the 10-20 International system of electrode placement. Electrical activity was reviewed with band pass filter of 1-70Hz , sensitivity of 7 uV/mm, display speed of 41mm/sec with a 60Hz  notched filter applied as appropriate. EEG data were recorded continuously and digitally stored.  Video monitoring was available and reviewed as appropriate. Description: The posterior dominant rhythm consists of 7.5 Hz activity of moderate voltage (25-35 uV) seen predominantly in posterior head regions, symmetric and reactive to eye opening and eye closing. EEG showed continuous generalized predominantly 5 to 6 Hz theta slowing admixed with intermittent 2-3hz  delta slowing. Hyperventilation and photic stimulation were not performed.    ABNORMALITY - Continuous slow, generalized  IMPRESSION: This study is suggestive of mild to moderate diffuse encephalopathy. No seizures or epileptiform discharges were seen throughout the recording.  Arlin KIDD Shelton      LOS: 6 days   Gordy CHRISTELLA Starch, MD 05/26/2024, 10:40 AM See [name], to contact our on call provider

## 2024-05-26 NOTE — Evaluation (Signed)
 Physical Therapy Evaluation Patient Details Name: [name] MRN: [id number] DOB: [birth year] Today's Date: 05/26/2024  History of Present Illness  73 yo female presents to therapy following hospital admission on 05/20/2024 due to AMS and was found to have acute metabolic encephalopathy. Pt recently hospitalized in May of this ear secondary to CVA. Pt PMH includes but is not limited to: arthritis, colon and cecum ca, HTN, anemia, liver dz, falls, MASH cirrhosis, variceal hemorrhage s/p TIPS, SIRS, A-fib, hypothyroidism, CVA and AKI.  Clinical Impression      Pt admitted with above diagnosis.  Pt currently with functional limitations due to the deficits listed below (see PT Problem List). Pt seated in recliner when PT arrived. Spouse present. Pt agreeable to therapy intervention and reported feeling better this evening. Pt required min A for sit to stand  from recliner and able to push from armrests, CGA for gait tasks in hallway 80 feet with RW. Pt required min A for B LE for sit to supine, pt left in bed all needs in place and spouse present. Pt will benefit from acute skilled PT to increase their independence and safety with mobility to allow discharge.       If plan is discharge home, recommend the following: A little help with walking and/or transfers;A little help with bathing/dressing/bathroom;Assistance with feeding;Assist for transportation;Help with stairs or ramp for entrance   Can travel by private vehicle        Equipment Recommendations None recommended by PT  Recommendations for Other Services       Functional Status Assessment Patient has had a recent decline in their functional status and demonstrates the ability to make significant improvements in function in a reasonable and predictable amount of time.     Precautions / Restrictions Precautions Precautions: Fall Restrictions Weight Bearing Restrictions Per Provider Order: No      Mobility  Bed Mobility Overal bed  mobility: Needs Assistance Bed Mobility: Sit to Supine       Sit to supine: Min assist, Used rails   General bed mobility comments: pt seated in recliner when PT arrived and returned to bed with min cues and assist at end of eval    Transfers Overall transfer level: Needs assistance Equipment used: Rolling walker (2 wheels) Transfers: Sit to/from Stand Sit to Stand: Min assist           General transfer comment: pt able to push to stand from arm rests on recliner with cues and min A for power up    Ambulation/Gait Ambulation/Gait assistance: Contact guard assist Gait Distance (Feet): 80 Feet Assistive device: Rolling walker (2 wheels) Gait Pattern/deviations: Step-through pattern Gait velocity: decreased     General Gait Details: slight trunk flexion, R shoulder elevation with B UE support at RW, pt demonstrated good reciprocal pattern no overt LOB and no reports of dizziness, pain and mild SOB with O2 96-99% on RA  Stairs            Wheelchair Mobility     Tilt Bed    Modified Rankin (Stroke Patients Only)       Balance Overall balance assessment: Mild deficits observed, not formally tested (no recent fall hx)                                           Pertinent Vitals/Pain Pain Assessment Pain Assessment: No/denies pain  Home Living Family/patient expects to be discharged to:: Private residence Living Arrangements: Spouse/significant other Available Help at Discharge: Family;Available 24 hours/day Type of Home: House Home Access: Ramped entrance       Home Layout: One level Home Equipment: Rolling Walker (2 wheels);BSC/3in1;Cane - single point;Wheelchair - manual;Hospital bed;Tub bench      Prior Function Prior Level of Function : Independent/Modified Independent;Needs assist             Mobility Comments: mod I with intermittent use SPC for household and community navigation ADLs Comments: Mod I with bADLs, beginnig  to perform some simple cooking, husband manages medications and bill pay     Extremity/Trunk Assessment        Lower Extremity Assessment Lower Extremity Assessment: Generalized weakness    Cervical / Trunk Assessment Cervical / Trunk Assessment: Other exceptions (noted R shoulder elevation when using Rw)  Communication   Communication Communication: No apparent difficulties    Cognition Arousal: Alert Behavior During Therapy: WFL for tasks assessed/performed   PT - Cognitive impairments: No apparent impairments                         Following commands: Intact       Cueing       General Comments      Exercises     Assessment/Plan    PT Assessment Patient needs continued PT services  PT Problem List Decreased strength;Decreased activity tolerance;Decreased balance;Decreased mobility       PT Treatment Interventions DME instruction;Gait training;Functional mobility training;Therapeutic exercise;Therapeutic activities;Balance training;Neuromuscular re-education;Patient/family education    PT Goals (Current goals can be found in the Care Plan section)  Acute Rehab PT Goals Patient Stated Goal: to be able to go home by my birthday PT Goal Formulation: With patient Time For Goal Achievement: 06/09/24 Potential to Achieve Goals: Good    Frequency Min 3X/week     Co-evaluation               AM-PAC PT 6 Clicks Mobility  Outcome Measure Help needed turning from your back to your side while in a flat bed without using bedrails?: A Little Help needed moving from lying on your back to sitting on the side of a flat bed without using bedrails?: A Little Help needed moving to and from a bed to a chair (including a wheelchair)?: A Little Help needed standing up from a chair using your arms (e.g., wheelchair or bedside chair)?: A Little Help needed to walk in hospital room?: A Little Help needed climbing 3-5 steps with a railing? : Total 6 Click  Score: 16    End of Session Equipment Utilized During Treatment: Gait belt Activity Tolerance: Patient tolerated treatment well Patient left: in bed;with call bell/phone within reach;with family/visitor present Nurse Communication: Mobility status PT Visit Diagnosis: Unsteadiness on feet (R26.81);Other abnormalities of gait and mobility (R26.89);Muscle weakness (generalized) (M62.81);Difficulty in walking, not elsewhere classified (R26.2)    Time: 8341-8273 PT Time Calculation (min) (ACUTE ONLY): 28 min   Charges:   PT Evaluation $PT Eval Moderate Complexity: 1 Mod PT Treatments $Gait Training: 8-22 mins PT General Charges $$ ACUTE PT VISIT: 1 Visit         [place], PT Acute Rehab   [name] 05/26/2024, 5:42 PM

## 2024-05-26 NOTE — Plan of Care (Signed)

## 2024-05-26 NOTE — Progress Notes (Addendum)
 PROGRESS NOTE    Patient: [name]                                    PCP: [name], NP                    DOB: 1951-06-15            DOA: 05/20/2024 [id number]             DOS: 05/26/2024, 10:38 AM   LOS: 6 days   Date of Service: The patient was seen and examined on 05/26/2024  Subjective:   The patient was seen and examined this morning, stable no acute distress Much more awake alert oriented, following commands. No issues overnight, no report of active rectal bleed or bloody stool. Blood pressure has stabilized on midodrine  Hemoglobin has stabilized  Husband present at bedside reporting mentation is much improved  Brief Narrative:   [name] 73 year old female with liver cirrhosis, prior EV's and GI bleed, status post TIPS, PAF not on anticoagulation, hypothyroidism, anemia of chronic disease who comes into the hospital with increased confusion. She is being brought by her husband. She has had prior hospitalizations for hepatic encephalopathy. Husband reports that she has done this before, and has progressively gotten worse over the last week. She was found to have an ammonia level almost 200. She has no white count, no fever, clinically without signs of having an infection or GI bleed at this point     Assesement and Plan: Principal Problem:   Acute metabolic encephalopathy Active Problems:   AKI (acute kidney injury) (HCC)   Cirrhosis (HCC)   Acquired hypothyroidism   Hepatic encephalopathy (HCC)   Anemia of chronic disease   SIRS (systemic inflammatory response syndrome) (HCC)   Acute prerenal azotemia   Paroxysmal atrial fibrillation (HCC)     Principal problem:  Hepatic encephalopathy, underlying decompensated liver cirrhosis  Hyperammonemia - Mentation has improved, back to baseline  - Episode of neck stiffness, extremity stiffness has resolved -CT and MRI of the brain have been reviewed, no acute abnormalities, chronic changes -Neurology  was consulted, status post evaluation, stroke ruled out, EEG no signs of seizures  -Husband present at bedside stating patient is at baseline  - Continue with lactulose  oral/enema  -Hemodynamically stable ammonia level low at 61>>> 48 >> 43  POA: progressive confusion over the past week, currently presentation consistent with hepatic encephalopathy.  -Husband does not want NG tube so we will continue enemas - No evidence of an acute infectious process, no evidence of GI bleed - Continue neurochecks   GI bleed with history of liver cirrhosis and esophageal varices, history of TIPS procedure  - Thus far on this admission s/p 3U PRBC blood transfusion -GI consulted, -Hemoccult positive x 2  - Ultrasound TIPS Doppler study revealed: Patent TIPS. Decreased velocities in the portal vein and tips compared to prior 2. Cholelithiasis.  N.p.o. in anticipation of possible EGD today 05/26/2024   Acute on chronic anemia - anemia of liver disease-with iron deficiency then GI bleed - S/p 1U PRBC transfusion 05/23/2024 - No signs of bleeding, transfusing 2U PRBC 05/25/2024 - Follow-up iron studies, B12, folate level    Latest Ref Rng & Units 05/26/2024    3:26 AM 05/25/2024    5:06 PM 05/25/2024    9:18 AM  CBC  WBC 4.0 - 10.5 K/uL  3.2     Hemoglobin 12.0 - 15.0 g/dL 89.9  9.5  6.8   Hematocrit 36.0 - 46.0 % 31.2  29.6  21.9   Platelets 150 - 400 K/uL 125      Total iron 26, TIBC 265, percent sat 10 ferritin 17, folate 20.7 B12 972 - Will initiate oral iron supplements  Hypotensive  -Midodrine  10 mg p.o. 3 times daily initiated, blood pressure stabilized Monitoring closely -Status post 2U PRBC transfusion on 05/25/2024     Active problems:  Paroxysmal A-fib-stable, rate controlled, not on anticoagulation due to prior GI bleed   hypothyroidism-continue Synthroid .  TSH normal   AKI -monitoring closely, stable   Hypernatremia -improved with IV fluids now tolerating p.o., N.p.o. today for  possible EGD  Hypokalemia -monitoring and replating orally     ----------------------------------------------------------------------------------------------------------------------------------------------- Nutritional status:  The patient's BMI is: Body mass index is 29.14 kg/m. I agree with the assessment and plan as outlined  ------------------------------------------------------------------------------------------------------------------------------------------------  DVT prophylaxis:  SCDs Start: 05/20/24 2134   Code Status:   Code Status: Full Code  Family Communication: Husband present at bedside, discussed the findings, plan of care -Advance care planning has been discussed.   Consults: GI/neurology  admission status:   Status is: Inpatient Remains inpatient appropriate because: Needing close observation, as needed blood transfusion, rectal lactulose , continues evaluation for encephalopathy   Disposition: From  - home             Planning for discharge in 2 days: to Home   Procedures:   No admission procedures for hospital encounter.   Antimicrobials:  Anti-infectives (From admission, onward)    Start     Dose/Rate Route Frequency Ordered Stop   05/21/24 1000  rifaximin  (XIFAXAN ) tablet 550 mg        550 mg Oral 2 times daily 05/21/24 0705          Medication:   aspirin  EC  81 mg Oral Daily   Chlorhexidine  Gluconate Cloth  6 each Topical Daily   feeding supplement  237 mL Oral BID BM   lactulose   30 g Oral Q6H   lactulose   300 mL Rectal BID   levothyroxine   25 mcg Oral Q0600   midodrine   10 mg Oral TID WC   rifaximin   550 mg Oral BID    acetaminophen  **OR** acetaminophen , lip balm, melatonin, metoprolol  tartrate, ondansetron  (ZOFRAN ) IV   Objective:   Vitals:   05/26/24 0500 05/26/24 0600 05/26/24 0800 05/26/24 0839  BP: (!) 90/46 (!) 97/47 (!) 103/48   Pulse: (!) 54 62 61 60  Resp: 11 17 12 12   Temp: (!) 97.4 F (36.3 C)  (!) 97.5 F  (36.4 C)   TempSrc: Oral  Oral   SpO2: 97% 97% 97% 94%  Weight: 73.2 kg     Height:        Intake/Output Summary (Last 24 hours) at 05/26/2024 1038 Last data filed at 05/26/2024 0839 Gross per 24 hour  Intake 1898.36 ml  Output 1510 ml  Net 388.36 ml   Filed Weights   05/21/24 1107 05/25/24 0500 05/26/24 0500  Weight: 67.3 kg 67.5 kg 73.2 kg     Physical examination:    General:  AAO x 3,  cooperative, no distress;   HEENT:  Normocephalic, PERRL, otherwise with in Normal limits   Neuro:  CNII-XII intact. , normal motor and sensation, reflexes intact   Lungs:   Clear to auscultation BL, Respirations unlabored,  No wheezes / crackles  Cardio:    S1/S2, RRR, No murmure, No Rubs or Gallops   Abdomen:  Soft, non-tender, bowel sounds active all four quadrants, no guarding or peritoneal signs.  Muscular  skeletal:  Limited exam -global generalized weaknesses - in bed, able to move all 4 extremities,   2+ pulses,  symmetric, No pitting edema  Skin:  Dry, warm to touch, negative for any Rashes,  Wounds: Please see nursing documentation     ------------------------------------------------------------------------------------------------------------------------------    LABs:     Latest Ref Rng & Units 05/26/2024    3:26 AM 05/25/2024    5:06 PM 05/25/2024    9:18 AM  CBC  WBC 4.0 - 10.5 K/uL 3.2     Hemoglobin 12.0 - 15.0 g/dL 89.9  9.5  6.8   Hematocrit 36.0 - 46.0 % 31.2  29.6  21.9   Platelets 150 - 400 K/uL 125         Latest Ref Rng & Units 05/26/2024    3:27 AM 05/25/2024    3:19 AM 05/24/2024    3:18 AM  CMP  Glucose 70 - 99 mg/dL 84  859  84   BUN 8 - 23 mg/dL 15  17  19    Creatinine 0.44 - 1.00 mg/dL 9.12  9.25  9.07   Sodium 135 - 145 mmol/L 137  139  142   Potassium 3.5 - 5.1 mmol/L 3.2  3.6  3.9   Chloride 98 - 111 mmol/L 108  116  115   CO2 22 - 32 mmol/L 21  21  20    Calcium  8.9 - 10.3 mg/dL 8.3  7.8  8.3   Total Protein 6.5 - 8.1 g/dL 5.1  4.7  5.5   Total  Bilirubin 0.0 - 1.2 mg/dL 1.2  0.8  1.3   Alkaline Phos 38 - 126 U/L 117  90  123   AST 15 - 41 U/L 42  36  48   ALT 0 - 44 U/L 28  25  32        Micro Results Recent Results (from the past 240 hours)  MRSA Next Gen by PCR, Nasal     Status: None   Collection Time: 05/21/24  1:27 PM   Specimen: Nasal Mucosa; Nasal Swab  Result Value Ref Range Status   MRSA by PCR Next Gen NOT DETECTED NOT DETECTED Final    Comment: (NOTE) The GeneXpert MRSA Assay (FDA approved for NASAL specimens only), is one component of a comprehensive MRSA colonization surveillance program. It is not intended to diagnose MRSA infection nor to guide or monitor treatment for MRSA infections. Test performance is not FDA approved in patients less than 73 years old. Performed at [facility], [address] [place], [place] [address]     Radiology Reports US  ABDOMEN LIMITED WITH LIVER DOPPLER Result Date: 05/26/2024 CLINICAL DATA:  Cirrhosis with ascites. EXAM: DUPLEX ULTRASOUND OF LIVER TECHNIQUE: Color and duplex Doppler ultrasound was performed to evaluate the hepatic in-flow and out-flow vessels. COMPARISON:  Ultrasound 03/31/2024 FINDINGS: Liver: Coarsened hepatic echotexture No focal lesion, mass or intrahepatic biliary ductal dilatation. Gallbladder: No sonographic Murphy's sign. No wall thickening. Cholelithiasis. Portal Vein Velocities Main Prox:  21.9 cm/sec; previously 70 centimeters/second Right: 30.0 cm/sec; previously says 71 centimeters/second Left: 27.6 cm/sec; previously 41 centimeters/second TIPS Proximal (portal): 53.9 cm/s;  previously 74 cm/s MID: 57.1 cm/s; previously 78 cm/s Distal (hepatic vein): 36.4 cm/s; previously 96 cm/s Hepatic Vein Velocities Right:  16.9 cm/sec; Middle:  20.8 cm/sec; Left:  15.8 cm/sec; IVC: Present and patent with normal respiratory phasicity. Hepatic Artery Velocity:  63.2 cm/sec Splenic Vein Velocity:  24.9 cm/sec Spleen: 11.2 cm x 4.6 cm x 3.5 cm  Portal Vein Occlusion/Thrombus: No Splenic Vein Occlusion/Thrombus: No Ascites: None Varices: None IMPRESSION: 1. Patent TIPS. Decreased velocities in the portal vein and tips compared to prior 2. Cholelithiasis. Electronically Signed   By: Norman Gatlin M.D.   On: 05/26/2024 00:03   US  EKG SITE RITE Result Date: 05/25/2024 If Site Rite image not attached, placement could not be confirmed due to current cardiac rhythm.   SIGNED: Adriana DELENA Grams, MD, FHM. FAAFP. Jol[facility] - [facility] Critical care time spent - 55 min.  In seeing, evaluating and examining the patient. Reviewing medical records, labs, drawn plan of care. [facility],  Pager (please use [email or web address] to page/ text) Please use Epic Secure Chat for non-urgent communication (7AM-7PM)  If 7PM-7AM, please contact night-coverage [email or web address], 05/26/2024, 10:38 AM

## 2024-05-27 DIAGNOSIS — D649 Anemia, unspecified: Secondary | ICD-10-CM

## 2024-05-27 DIAGNOSIS — G9341 Metabolic encephalopathy: Secondary | ICD-10-CM | POA: Diagnosis not present

## 2024-05-27 LAB — PROTIME-INR
INR: 1.4 — ABNORMAL HIGH (ref 0.8–1.2)
Prothrombin Time: 18 s — ABNORMAL HIGH (ref 11.4–15.2)

## 2024-05-27 LAB — CBC
HCT: 31.8 % — ABNORMAL LOW (ref 36.0–46.0)
Hemoglobin: 10.4 g/dL — ABNORMAL LOW (ref 12.0–15.0)
MCH: 31.8 pg (ref 26.0–34.0)
MCHC: 32.7 g/dL (ref 30.0–36.0)
MCV: 97.2 fL (ref 80.0–100.0)
Platelets: 145 K/uL — ABNORMAL LOW (ref 150–400)
RBC: 3.27 MIL/uL — ABNORMAL LOW (ref 3.87–5.11)
RDW: 18.9 % — ABNORMAL HIGH (ref 11.5–15.5)
WBC: 3 K/uL — ABNORMAL LOW (ref 4.0–10.5)
nRBC: 0 % (ref 0.0–0.2)

## 2024-05-27 LAB — GLUCOSE, CAPILLARY: Glucose-Capillary: 103 mg/dL — ABNORMAL HIGH (ref 70–99)

## 2024-05-27 LAB — TSH: TSH: 17.311 u[IU]/mL — ABNORMAL HIGH (ref 0.350–4.500)

## 2024-05-27 LAB — AMMONIA: Ammonia: 80 umol/L — ABNORMAL HIGH (ref 9–35)

## 2024-05-27 MED ORDER — ALBUMIN HUMAN 25 % IV SOLN
25.0000 g | Freq: Once | INTRAVENOUS | Status: AC
  Administered 2024-05-27: 12.5 g via INTRAVENOUS
  Filled 2024-05-27: qty 100

## 2024-05-27 MED ORDER — POTASSIUM CHLORIDE CRYS ER 20 MEQ PO TBCR
40.0000 meq | EXTENDED_RELEASE_TABLET | Freq: Every day | ORAL | Status: DC
  Administered 2024-05-27 – 2024-05-28 (×2): 40 meq via ORAL
  Filled 2024-05-27 (×2): qty 2

## 2024-05-27 MED ORDER — LACTULOSE 10 GM/15ML PO SOLN
30.0000 g | Freq: Three times a day (TID) | ORAL | Status: DC
  Administered 2024-05-27 – 2024-05-29 (×6): 30 g via ORAL
  Filled 2024-05-27 (×5): qty 45

## 2024-05-27 MED ORDER — ASPIRIN 81 MG PO TBEC
81.0000 mg | DELAYED_RELEASE_TABLET | Freq: Every day | ORAL | Status: DC
  Administered 2024-05-29: 81 mg via ORAL
  Filled 2024-05-27: qty 1

## 2024-05-27 MED ORDER — PANTOPRAZOLE SODIUM 40 MG PO TBEC
40.0000 mg | DELAYED_RELEASE_TABLET | Freq: Two times a day (BID) | ORAL | Status: DC
  Administered 2024-05-27 – 2024-05-29 (×4): 40 mg via ORAL
  Filled 2024-05-27 (×4): qty 1

## 2024-05-27 NOTE — Progress Notes (Addendum)
 PROGRESS NOTE    Patient: [name]                                    PCP: [name], NP                    DOB: 01/08/1951            DOA: 05/20/2024 FMW:[id number]             DOS: 05/27/2024, 10:56 AM   LOS: 7 days   Date of Service: The patient was seen and examined on 05/27/2024  Subjective:   The patient was seen and examined this morning, awake alert oriented, following commands, no acute distress Medically stable, no issues overnight Husband present at bedside reporting mentation is much improved  Addendum: GI planning EGD for a.m., n.p.o. after midnight  Brief Narrative:   [name] 73 year old female with liver cirrhosis, prior EV's and GI bleed, status post TIPS, PAF not on anticoagulation, hypothyroidism, anemia of chronic disease who comes into the hospital with increased confusion. She is being brought by her husband. She has had prior hospitalizations for hepatic encephalopathy. Husband reports that she has done this before, and has progressively gotten worse over the last week. She was found to have an ammonia level almost 200. She has no white count, no fever, clinically without signs of having an infection or GI bleed at this point     Assesement and Plan: Principal Problem:   Acute metabolic encephalopathy Active Problems:   AKI (acute kidney injury) (HCC)   Cirrhosis (HCC)   Acquired hypothyroidism   Hepatic encephalopathy (HCC)   Anemia of chronic disease   SIRS (systemic inflammatory response syndrome) (HCC)   Acute prerenal azotemia   Paroxysmal atrial fibrillation (HCC)     Principal problem:  Hepatic encephalopathy, underlying decompensated liver cirrhosis  Hyperammonemia - Much improved mentation, back to baseline  - Episodes of encephalopathy, stiffness,-stroke workup-negative -CT and MRI of the brain have been reviewed, no acute abnormalities, chronic changes -Neurology was consulted, EEG no signs of seizures -Husband present  at bedside stating patient is at baseline  - Continue with lactulose  oral/enema  -Aammonia level low at 61>>> 48 >> 43>> 80  POA: progressive confusion over the past week, currently presentation consistent with hepatic encephalopathy.  -Husband does not want NG tube so we will continue enemas - No evidence of an acute infectious process, no evidence of GI bleed - Continue neurochecks   GI bleed with history of liver cirrhosis and esophageal varices, history of TIPS procedure  - Thus far on this admission s/p 3U PRBC blood transfusion -GI consulted, -Hemoccult positive x 2  - Ultrasound TIPS Doppler study revealed: Patent TIPS. Decreased velocities in the portal vein and tips compared to prior 2. Cholelithiasis.  N.p.o. in anticipation of EGD, pending GI evaluation 05/27/2024   Acute on chronic anemia - anemia of liver disease-with iron deficiency then GI bleed - S/p 1U PRBC transfusion 05/23/2024 - No signs of bleeding, transfusing 2U PRBC 05/25/2024 - Follow-up iron studies, B12, folate level    Latest Ref Rng & Units 05/27/2024    8:20 AM 05/26/2024    3:26 AM 05/25/2024    5:06 PM  CBC  WBC 4.0 - 10.5 K/uL 3.0  3.2    Hemoglobin 12.0 - 15.0 g/dL 89.5  89.9  9.5   Hematocrit 36.0 -  46.0 % 31.8  31.2  29.6   Platelets 150 - 400 K/uL 145  125      Total iron 26, TIBC 265, percent sat 10 ferritin 17, folate 20.7 B12 972 - Will initiate oral iron supplements  Hypoalbuminemia  - Replacing with 25 g of IV albumin  05/27/24  hypotensive  -Midodrine  10 mg p.o. 3 times daily initiated, blood pressure stabilized Reducing midodrine  to 5 mg p.o. 3 times daily Monitoring closely -Status post 2U PRBC transfusion on 05/25/2024     Active problems:  Paroxysmal A-fib-stable, rate controlled, not on anticoagulation due to prior GI bleed   hypothyroidism-continue Synthroid .  TSH normal   AKI -monitoring closely, stable   Hypernatremia -improved with IV fluids now tolerating  p.o., N.p.o. today for possible EGD  Hypokalemia -monitoring and replating orally     ----------------------------------------------------------------------------------------------------------------------------------------------- Nutritional status:  The patient's BMI is: Body mass index is 28.66 kg/m. I agree with the assessment and plan as outlined  ------------------------------------------------------------------------------------------------------------------------------------------------  DVT prophylaxis:  SCDs Start: 05/20/24 2134   Code Status:   Code Status: Full Code  Family Communication: Husband present at bedside, discussed the findings, plan of care -Advance care planning has been discussed.   Consults: GI/neurology  admission status:   Status is: Inpatient Remains inpatient appropriate because: Needing close observation, as needed blood transfusion, rectal lactulose , continues evaluation for encephalopathy   Disposition: From  - home             Planning for discharge in 2 days: to Home   Procedures:   No admission procedures for hospital encounter.   Antimicrobials:  Anti-infectives (From admission, onward)    Start     Dose/Rate Route Frequency Ordered Stop   05/21/24 1000  rifaximin  (XIFAXAN ) tablet 550 mg        550 mg Oral 2 times daily 05/21/24 0705          Medication:   [START ON 05/29/2024] aspirin  EC  81 mg Oral Daily   Chlorhexidine  Gluconate Cloth  6 each Topical Daily   feeding supplement  237 mL Oral BID BM   ferrous sulfate   325 mg Oral BID WC   lactulose   30 g Oral TID   levothyroxine   25 mcg Oral Q0600   midodrine   10 mg Oral TID WC   pantoprazole   40 mg Oral BID   potassium chloride   40 mEq Oral Daily   rifaximin   550 mg Oral BID    acetaminophen  **OR** acetaminophen , lip balm, melatonin, metoprolol  tartrate, ondansetron  (ZOFRAN ) IV   Objective:   Vitals:   05/27/24 0500 05/27/24 0600 05/27/24 0700 05/27/24 0800  BP:  (!) 94/54 (!) 105/45 (!) 101/46 (!) 95/45  Pulse: (!) 55 (!) 53 (!) 53 (!) 54  Resp: 12 10 11 12   Temp:    (!) 97.2 F (36.2 C)  TempSrc:    Oral  SpO2: 98% 96% 96% 97%  Weight: 72 kg     Height:        Intake/Output Summary (Last 24 hours) at 05/27/2024 1056 Last data filed at 05/27/2024 1000 Gross per 24 hour  Intake 389.63 ml  Output 750 ml  Net -360.37 ml   Filed Weights   05/25/24 0500 05/26/24 0500 05/27/24 0500  Weight: 67.5 kg 73.2 kg 72 kg     Physical examination:    General:  AAO x 3,  cooperative, no distress;   HEENT:  Normocephalic, PERRL, otherwise with in Normal limits   Neuro:  CNII-XII  intact. , normal motor and sensation, reflexes intact   Lungs:   Clear to auscultation BL, Respirations unlabored,  No wheezes / crackles  Cardio:    S1/S2, RRR, No murmure, No Rubs or Gallops   Abdomen:  Soft, non-tender, bowel sounds active all four quadrants, no guarding or peritoneal signs.  Muscular  skeletal:  Limited exam -global generalized weaknesses - in bed, able to move all 4 extremities,   2+ pulses,  symmetric, No pitting edema  Skin:  Dry, warm to touch, negative for any Rashes,  Wounds: Please see nursing documentation  ----------------------------------------------------------------------------------------------------------------------    LABs:     Latest Ref Rng & Units 05/27/2024    8:20 AM 05/26/2024    3:26 AM 05/25/2024    5:06 PM  CBC  WBC 4.0 - 10.5 K/uL 3.0  3.2    Hemoglobin 12.0 - 15.0 g/dL 89.5  89.9  9.5   Hematocrit 36.0 - 46.0 % 31.8  31.2  29.6   Platelets 150 - 400 K/uL 145  125        Latest Ref Rng & Units 05/26/2024    3:27 AM 05/25/2024    3:19 AM 05/24/2024    3:18 AM  CMP  Glucose 70 - 99 mg/dL 84  859  84   BUN 8 - 23 mg/dL 15  17  19    Creatinine 0.44 - 1.00 mg/dL 9.12  9.25  9.07   Sodium 135 - 145 mmol/L 137  139  142   Potassium 3.5 - 5.1 mmol/L 3.2  3.6  3.9   Chloride 98 - 111 mmol/L 108  116  115   CO2 22 - 32 mmol/L 21   21  20    Calcium  8.9 - 10.3 mg/dL 8.3  7.8  8.3   Total Protein 6.5 - 8.1 g/dL 5.1  4.7  5.5   Total Bilirubin 0.0 - 1.2 mg/dL 1.2  0.8  1.3   Alkaline Phos 38 - 126 U/L 117  90  123   AST 15 - 41 U/L 42  36  48   ALT 0 - 44 U/L 28  25  32        Micro Results Recent Results (from the past 240 hours)  MRSA Next Gen by PCR, Nasal     Status: None   Collection Time: 05/21/24  1:27 PM   Specimen: Nasal Mucosa; Nasal Swab  Result Value Ref Range Status   MRSA by PCR Next Gen NOT DETECTED NOT DETECTED Final    Comment: (NOTE) The GeneXpert MRSA Assay (FDA approved for NASAL specimens only), is one component of a comprehensive MRSA colonization surveillance program. It is not intended to diagnose MRSA infection nor to guide or monitor treatment for MRSA infections. Test performance is not FDA approved in patients less than 75 years old. Performed at [facility], [address], [place], [place] [address]     Radiology Reports No results found.   SIGNED: Adriana DELENA Grams, MD, FHM. FAAFP. Jol[facility] - [facility] Critical care time spent - 55 min.  In seeing, evaluating and examining the patient. Reviewing medical records, labs, drawn plan of care. [facility],  Pager (please use [email or web address] to page/ text) Please use Epic Secure Chat for non-urgent communication (7AM-7PM)  If 7PM-7AM, please contact night-coverage [email or web address], 05/27/2024, 10:56 AM

## 2024-05-27 NOTE — Progress Notes (Signed)
 Physical Therapy Treatment Patient Details Name: [name] MRN: [id number] DOB: 1951/08/10 Today's Date: 05/27/2024   History of Present Illness 73 yo female presents to therapy following hospital admission on 05/20/2024 due to AMS and was found to have acute metabolic encephalopathy. Pt recently hospitalized in May of this ear secondary to CVA. Pt PMH includes but is not limited to: arthritis, colon and cecum ca, HTN, anemia, liver dz, falls, MASH cirrhosis, variceal hemorrhage s/p TIPS, SIRS, A-fib, hypothyroidism, CVA and AKI.    PT Comments  Pt very motivated and progressing well with mobility including increased activity tolerance noted and decreasing level of assist for most tasks.  Pt and spouse hopeful for dc home soon.    If plan is discharge home, recommend the following: A little help with walking and/or transfers;A little help with bathing/dressing/bathroom;Assistance with feeding;Assist for transportation;Help with stairs or ramp for entrance   Can travel by private vehicle        Equipment Recommendations  None recommended by PT    Recommendations for Other Services       Precautions / Restrictions Precautions Precautions: Fall Restrictions Weight Bearing Restrictions Per Provider Order: No     Mobility  Bed Mobility Overal bed mobility: Needs Assistance Bed Mobility: Supine to Sit     Supine to sit: Contact guard     General bed mobility comments: Increased time    Transfers Overall transfer level: Needs assistance Equipment used: Rolling walker (2 wheels) Transfers: Sit to/from Stand Sit to Stand: Contact guard assist           General transfer comment: Steady assist for safety only    Ambulation/Gait Ambulation/Gait assistance: Contact guard assist Gait Distance (Feet): 160 Feet Assistive device: Rolling walker (2 wheels) Gait Pattern/deviations: Step-through pattern Gait velocity: decreased     General Gait Details: slight trunk  flexion, R shoulder elevation with B UE support at RW, pt demonstrated good reciprocal pattern   Stairs             Wheelchair Mobility     Tilt Bed    Modified Rankin (Stroke Patients Only)       Balance Overall balance assessment: Mild deficits observed, not formally tested                                          Communication Communication Communication: No apparent difficulties  Cognition Arousal: Alert Behavior During Therapy: WFL for tasks assessed/performed   PT - Cognitive impairments: No apparent impairments                         Following commands: Intact      Cueing    Exercises      General Comments        Pertinent Vitals/Pain Pain Assessment Pain Assessment: No/denies pain    Home Living                          Prior Function            PT Goals (current goals can now be found in the care plan section) Acute Rehab PT Goals Patient Stated Goal: to be able to go home by my birthday PT Goal Formulation: With patient Time For Goal Achievement: 06/09/24 Potential to Achieve Goals: Good Progress towards PT goals: Progressing toward goals  Frequency    Min 3X/week      PT Plan      Co-evaluation              AM-PAC PT 6 Clicks Mobility   Outcome Measure  Help needed turning from your back to your side while in a flat bed without using bedrails?: A Little Help needed moving from lying on your back to sitting on the side of a flat bed without using bedrails?: A Little Help needed moving to and from a bed to a chair (including a wheelchair)?: A Little Help needed standing up from a chair using your arms (e.g., wheelchair or bedside chair)?: A Little Help needed to walk in hospital room?: A Little Help needed climbing 3-5 steps with a railing? : Total 6 Click Score: 16    End of Session Equipment Utilized During Treatment: Gait belt Activity Tolerance: Patient tolerated  treatment well Patient left: in chair;with call bell/phone within reach;with chair alarm set;with family/visitor present;with nursing/sitter in room Nurse Communication: Mobility status PT Visit Diagnosis: Unsteadiness on feet (R26.81);Other abnormalities of gait and mobility (R26.89);Muscle weakness (generalized) (M62.81);Difficulty in walking, not elsewhere classified (R26.2)     Time: 1010-1029 PT Time Calculation (min) (ACUTE ONLY): 19 min  Charges:    $Gait Training: 8-22 mins PT General Charges $$ ACUTE PT VISIT: 1 Visit                     [facility] PT Acute Rehabilitation Services Office [phone]    Longino Trefz 05/27/2024, 11:38 AM

## 2024-05-27 NOTE — Plan of Care (Signed)
 Patient schedule for upper endoscopy in the morning, consent on chart, plan of care, goals and education provided with time for questions, patient handbook/guide at bedside.  Problem: Education: Goal: Knowledge of General Education information will improve Description: Including pain rating scale, medication(s)/side effects and non-pharmacologic comfort measures Outcome: Progressing   Problem: Health Behavior/Discharge Planning: Goal: Ability to manage health-related needs will improve Outcome: Progressing   Problem: Clinical Measurements: Goal: Ability to maintain clinical measurements within normal limits will improve Outcome: Progressing Goal: Will remain free from infection Outcome: Progressing Goal: Diagnostic test results will improve Outcome: Progressing Goal: Respiratory complications will improve Outcome: Progressing Goal: Cardiovascular complication will be avoided Outcome: Progressing   Problem: Activity: Goal: Risk for activity intolerance will decrease Outcome: Progressing   Problem: Nutrition: Goal: Adequate nutrition will be maintained Outcome: Progressing   Problem: Coping: Goal: Level of anxiety will decrease Outcome: Progressing   Problem: Elimination: Goal: Will not experience complications related to bowel motility Outcome: Progressing Goal: Will not experience complications related to urinary retention Outcome: Progressing   Problem: Pain Managment: Goal: General experience of comfort will improve and/or be controlled Outcome: Progressing   Problem: Safety: Goal: Ability to remain free from injury will improve Outcome: Progressing   Problem: Skin Integrity: Goal: Risk for impaired skin integrity will decrease Outcome: Progressing

## 2024-05-27 NOTE — Progress Notes (Signed)
 Progress Note   Assessment    73 year old female with a history of MASH cirrhosis, oxaliplatin induced portal hypertension, history of variceal hemorrhage status post TIPS in 2014, recurrent hepatic encephalopathy on lactulose  at home admitted with recurrent hepatic encephalopathy and declining hemoglobin without overt bleeding   Principal Problem:   Acute metabolic encephalopathy Active Problems:   Acquired hypothyroidism   Cirrhosis (HCC)   Hepatic encephalopathy (HCC)   AKI (acute kidney injury) (HCC)   Anemia of chronic disease   SIRS (systemic inflammatory response syndrome) (HCC)   Acute prerenal azotemia   Paroxysmal atrial fibrillation (HCC)   Recommendations  Decompensated MASH cirrhosis/history of oxaliplatin induced portal hypertension; recurrent hepatic encephalopathy  1.  Hepatic encephalopathy --much improved. --Change lactulose  back to her normal home dose; 3 times daily -- Discontinue PR lactulose  and rectal tube as mobility allows  2.  Heme positive stool/declining hemoglobin --hemoglobin now stable.  TIPS is patent and so she should not have variceal bleed noticed this seems to be the case clinically.  Given that there was no clear precipitant for hepatic encephalopathy this time query slow GI bleed -- Upper endoscopy in the morning with monitored anesthesia care at 8 AM -- N.p.o. after midnight -- Hemoglobin every 12 hours is sufficient      Chief Complaint   Patient up and walking with physical therapy this morning She is feeling much better Husband at bedside No overnight events  Hemoglobin up to 10.4 Rectal tube remains in place which she would like to have out  Vital signs in last 24 hours: Temp:  [96.1 F (35.6 C)-98.4 F (36.9 C)] 97.2 F (36.2 C) (07/05 0800) Pulse Rate:  [47-63] 54 (07/05 0800) Resp:  [10-22] 12 (07/05 0800) BP: (76-129)/(35-59) 95/45 (07/05 0800) SpO2:  [96 %-99 %] 97 % (07/05 0800) Weight:  [72 kg] 72 kg (07/05  0500) Last BM Date : 05/27/24 Gen: awake, alert, NAD HEENT: anicteric  CV: RRR, no mrg Abd: soft, NT/ND, +BS throughout Ext: no c/c, trace pretibial edema Neuro: nonfocal  Intake/Output from previous day: 07/04 0701 - 07/05 0700 In: 420.2 [P.O.:120; I.V.:180.2] Out: 140 [Stool:140] Intake/Output this shift: Total I/O In: 89.4 [IV Piggyback:89.4] Out: 650 [Urine:650]  Lab Results: Recent Labs    05/25/24 0318 05/25/24 0918 05/25/24 1706 05/26/24 0326 05/27/24 0820  WBC 3.0*  --   --  3.2* 3.0*  HGB 6.5*   < > 9.5* 10.0* 10.4*  HCT 21.4*   < > 29.6* 31.2* 31.8*  PLT 100*  --   --  125* 145*   < > = values in this interval not displayed.   BMET Recent Labs    05/25/24 0319 05/26/24 0327  NA 139 137  K 3.6 3.2*  CL 116* 108  CO2 21* 21*  GLUCOSE 140* 84  BUN 17 15  CREATININE 0.74 0.87  CALCIUM  7.8* 8.3*   LFT Recent Labs    05/26/24 0327  PROT 5.1*  ALBUMIN  2.1*  AST 42*  ALT 28  ALKPHOS 117  BILITOT 1.2   PT/INR Recent Labs    05/27/24 0820  LABPROT 18.0*  INR 1.4*   Hepatitis Panel No results for input(s): HEPBSAG, HCVAB, HEPAIGM, HEPBIGM in the last 72 hours.  Studies/Results: US  ABDOMEN LIMITED WITH LIVER DOPPLER Result Date: 05/26/2024 CLINICAL DATA:  Cirrhosis with ascites. EXAM: DUPLEX ULTRASOUND OF LIVER TECHNIQUE: Color and duplex Doppler ultrasound was performed to evaluate the hepatic in-flow and out-flow vessels. COMPARISON:  Ultrasound 03/31/2024 FINDINGS:  Liver: Coarsened hepatic echotexture No focal lesion, mass or intrahepatic biliary ductal dilatation. Gallbladder: No sonographic Murphy's sign. No wall thickening. Cholelithiasis. Portal Vein Velocities Main Prox:  21.9 cm/sec; previously 70 centimeters/second Right: 30.0 cm/sec; previously says 71 centimeters/second Left: 27.6 cm/sec; previously 41 centimeters/second TIPS Proximal (portal): 53.9 cm/s;  previously 74 cm/s MID: 57.1 cm/s; previously 78 cm/s Distal (hepatic vein):  36.4 cm/s; previously 96 cm/s Hepatic Vein Velocities Right:  16.9 cm/sec; Middle:  20.8 cm/sec; Left:  15.8 cm/sec; IVC: Present and patent with normal respiratory phasicity. Hepatic Artery Velocity:  63.2 cm/sec Splenic Vein Velocity:  24.9 cm/sec Spleen: 11.2 cm x 4.6 cm x 3.5 cm Portal Vein Occlusion/Thrombus: No Splenic Vein Occlusion/Thrombus: No Ascites: None Varices: None IMPRESSION: 1. Patent TIPS. Decreased velocities in the portal vein and tips compared to prior 2. Cholelithiasis. Electronically Signed   By: Norman Gatlin M.D.   On: 05/26/2024 00:03   US  EKG SITE RITE Result Date: 05/25/2024 If Site Rite image not attached, placement could not be confirmed due to current cardiac rhythm.     LOS: 7 days   Gordy Katherine Starch, MD 05/27/2024, 10:38 AM See [name], [name], to contact our on call provider

## 2024-05-27 NOTE — H&P (View-Only) (Signed)
 Progress Note   Assessment    73 year old female with a history of MASH cirrhosis, oxaliplatin induced portal hypertension, history of variceal hemorrhage status post TIPS in 2014, recurrent hepatic encephalopathy on lactulose  at home admitted with recurrent hepatic encephalopathy and declining hemoglobin without overt bleeding   Principal Problem:   Acute metabolic encephalopathy Active Problems:   Acquired hypothyroidism   Cirrhosis (HCC)   Hepatic encephalopathy (HCC)   AKI (acute kidney injury) (HCC)   Anemia of chronic disease   SIRS (systemic inflammatory response syndrome) (HCC)   Acute prerenal azotemia   Paroxysmal atrial fibrillation (HCC)   Recommendations  Decompensated MASH cirrhosis/history of oxaliplatin induced portal hypertension; recurrent hepatic encephalopathy  1.  Hepatic encephalopathy --much improved. --Change lactulose  back to her normal home dose; 3 times daily -- Discontinue PR lactulose  and rectal tube as mobility allows  2.  Heme positive stool/declining hemoglobin --hemoglobin now stable.  TIPS is patent and so she should not have variceal bleed noticed this seems to be the case clinically.  Given that there was no clear precipitant for hepatic encephalopathy this time query slow GI bleed -- Upper endoscopy in the morning with monitored anesthesia care at 8 AM -- N.p.o. after midnight -- Hemoglobin every 12 hours is sufficient      Chief Complaint   Patient up and walking with physical therapy this morning She is feeling much better Husband at bedside No overnight events  Hemoglobin up to 10.4 Rectal tube remains in place which she would like to have out  Vital signs in last 24 hours: Temp:  [96.1 F (35.6 C)-98.4 F (36.9 C)] 97.2 F (36.2 C) (07/05 0800) Pulse Rate:  [47-63] 54 (07/05 0800) Resp:  [10-22] 12 (07/05 0800) BP: (76-129)/(35-59) 95/45 (07/05 0800) SpO2:  [96 %-99 %] 97 % (07/05 0800) Weight:  [72 kg] 72 kg (07/05  0500) Last BM Date : 05/27/24 Gen: awake, alert, NAD HEENT: anicteric  CV: RRR, no mrg Abd: soft, NT/ND, +BS throughout Ext: no c/c, trace pretibial edema Neuro: nonfocal  Intake/Output from previous day: 07/04 0701 - 07/05 0700 In: 420.2 [P.O.:120; I.V.:180.2] Out: 140 [Stool:140] Intake/Output this shift: Total I/O In: 89.4 [IV Piggyback:89.4] Out: 650 [Urine:650]  Lab Results: Recent Labs    05/25/24 0318 05/25/24 0918 05/25/24 1706 05/26/24 0326 05/27/24 0820  WBC 3.0*  --   --  3.2* 3.0*  HGB 6.5*   < > 9.5* 10.0* 10.4*  HCT 21.4*   < > 29.6* 31.2* 31.8*  PLT 100*  --   --  125* 145*   < > = values in this interval not displayed.   BMET Recent Labs    05/25/24 0319 05/26/24 0327  NA 139 137  K 3.6 3.2*  CL 116* 108  CO2 21* 21*  GLUCOSE 140* 84  BUN 17 15  CREATININE 0.74 0.87  CALCIUM  7.8* 8.3*   LFT Recent Labs    05/26/24 0327  PROT 5.1*  ALBUMIN  2.1*  AST 42*  ALT 28  ALKPHOS 117  BILITOT 1.2   PT/INR Recent Labs    05/27/24 0820  LABPROT 18.0*  INR 1.4*   Hepatitis Panel No results for input(s): HEPBSAG, HCVAB, HEPAIGM, HEPBIGM in the last 72 hours.  Studies/Results: US  ABDOMEN LIMITED WITH LIVER DOPPLER Result Date: 05/26/2024 CLINICAL DATA:  Cirrhosis with ascites. EXAM: DUPLEX ULTRASOUND OF LIVER TECHNIQUE: Color and duplex Doppler ultrasound was performed to evaluate the hepatic in-flow and out-flow vessels. COMPARISON:  Ultrasound 03/31/2024 FINDINGS:  Liver: Coarsened hepatic echotexture No focal lesion, mass or intrahepatic biliary ductal dilatation. Gallbladder: No sonographic Murphy's sign. No wall thickening. Cholelithiasis. Portal Vein Velocities Main Prox:  21.9 cm/sec; previously 70 centimeters/second Right: 30.0 cm/sec; previously says 71 centimeters/second Left: 27.6 cm/sec; previously 41 centimeters/second TIPS Proximal (portal): 53.9 cm/s;  previously 74 cm/s MID: 57.1 cm/s; previously 78 cm/s Distal (hepatic vein):  36.4 cm/s; previously 96 cm/s Hepatic Vein Velocities Right:  16.9 cm/sec; Middle:  20.8 cm/sec; Left:  15.8 cm/sec; IVC: Present and patent with normal respiratory phasicity. Hepatic Artery Velocity:  63.2 cm/sec Splenic Vein Velocity:  24.9 cm/sec Spleen: 11.2 cm x 4.6 cm x 3.5 cm Portal Vein Occlusion/Thrombus: No Splenic Vein Occlusion/Thrombus: No Ascites: None Varices: None IMPRESSION: 1. Patent TIPS. Decreased velocities in the portal vein and tips compared to prior 2. Cholelithiasis. Electronically Signed   By: Norman Gatlin M.D.   On: 05/26/2024 00:03   US  EKG SITE RITE Result Date: 05/25/2024 If Site Rite image not attached, placement could not be confirmed due to current cardiac rhythm.     LOS: 7 days   Gordy CHRISTELLA Starch, MD 05/27/2024, 10:38 AM See [name], [name], to contact our on call provider

## 2024-05-27 NOTE — Anesthesia Preprocedure Evaluation (Signed)
 Anesthesia Evaluation  Patient identified by MRN, date of birth, ID band Patient awake    Reviewed: Allergy & Precautions, NPO status , Patient's Chart, lab work & pertinent test results, reviewed documented beta blocker date and time   Airway Mallampati: II  TM Distance: >3 FB Neck ROM: Full    Dental no notable dental hx. (+) Teeth Intact, Dental Advisory Given   Pulmonary neg pulmonary ROS   Pulmonary exam normal breath sounds clear to auscultation       Cardiovascular hypertension, Pt. on medications Normal cardiovascular exam+ dysrhythmias Atrial Fibrillation + Valvular Problems/Murmurs  Rhythm:Regular Rate:Normal  EKG 05/20/24/ Atrial fibrillation 90/min   Neuro/Psych AMS CVA, Residual Symptoms  negative psych ROS   GI/Hepatic ,,,(+) Cirrhosis   Esophageal Varices    MASH S/P TIPS Heme + stool Hx/o colon polyps Hx/o colon Ca S/P right hemicolectomy ChemoRx   Endo/Other  Hypothyroidism  HLD  Renal/GU Renal InsufficiencyRenal disease  negative genitourinary   Musculoskeletal  (+) Arthritis , Osteoarthritis,    Abdominal   Peds  Hematology  (+) Blood dyscrasia, anemia Thrombocytopenia - Plt 145k PT-18 INR 1.4   Anesthesia Other Findings   Reproductive/Obstetrics                              Anesthesia Physical Anesthesia Plan  ASA: 3 and emergent  Anesthesia Plan: MAC   Post-op Pain Management: Minimal or no pain anticipated   Induction: Intravenous  PONV Risk Score and Plan: 2 and Propofol  infusion and Treatment may vary due to age or medical condition  Airway Management Planned: Natural Airway and Simple Face Mask  Additional Equipment: None  Intra-op Plan:   Post-operative Plan:   Informed Consent: I have reviewed the patients History and Physical, chart, labs and discussed the procedure including the risks, benefits and alternatives for the proposed anesthesia  with the patient or authorized representative who has indicated his/her understanding and acceptance.     Dental advisory given  Plan Discussed with: CRNA and Anesthesiologist  Anesthesia Plan Comments:          Anesthesia Quick Evaluation

## 2024-05-28 ENCOUNTER — Inpatient Hospital Stay (HOSPITAL_COMMUNITY): Admitting: Anesthesiology

## 2024-05-28 ENCOUNTER — Encounter (HOSPITAL_COMMUNITY): Admitting: Anesthesiology

## 2024-05-28 ENCOUNTER — Encounter (HOSPITAL_COMMUNITY)
Admission: EM | Disposition: A | Discharge status: Home Health | Payer: Self-pay | Source: Home / Self Care | Attending: Family Medicine

## 2024-05-28 ENCOUNTER — Encounter (HOSPITAL_COMMUNITY): Payer: Self-pay | Admitting: Internal Medicine

## 2024-05-28 DIAGNOSIS — K259 Gastric ulcer, unspecified as acute or chronic, without hemorrhage or perforation: Secondary | ICD-10-CM

## 2024-05-28 DIAGNOSIS — I679 Cerebrovascular disease, unspecified: Secondary | ICD-10-CM | POA: Diagnosis not present

## 2024-05-28 DIAGNOSIS — K2950 Unspecified chronic gastritis without bleeding: Secondary | ICD-10-CM

## 2024-05-28 DIAGNOSIS — I480 Paroxysmal atrial fibrillation: Secondary | ICD-10-CM

## 2024-05-28 DIAGNOSIS — K2970 Gastritis, unspecified, without bleeding: Secondary | ICD-10-CM

## 2024-05-28 DIAGNOSIS — K253 Acute gastric ulcer without hemorrhage or perforation: Secondary | ICD-10-CM

## 2024-05-28 DIAGNOSIS — G9341 Metabolic encephalopathy: Secondary | ICD-10-CM | POA: Diagnosis not present

## 2024-05-28 DIAGNOSIS — K449 Diaphragmatic hernia without obstruction or gangrene: Secondary | ICD-10-CM | POA: Diagnosis not present

## 2024-05-28 DIAGNOSIS — R195 Other fecal abnormalities: Secondary | ICD-10-CM

## 2024-05-28 HISTORY — PX: ESOPHAGOGASTRODUODENOSCOPY: SHX5428

## 2024-05-28 LAB — CBC
HCT: 32.2 % — ABNORMAL LOW (ref 36.0–46.0)
Hemoglobin: 10.3 g/dL — ABNORMAL LOW (ref 12.0–15.0)
MCH: 31.3 pg (ref 26.0–34.0)
MCHC: 32 g/dL (ref 30.0–36.0)
MCV: 97.9 fL (ref 80.0–100.0)
Platelets: 138 K/uL — ABNORMAL LOW (ref 150–400)
RBC: 3.29 MIL/uL — ABNORMAL LOW (ref 3.87–5.11)
RDW: 18.8 % — ABNORMAL HIGH (ref 11.5–15.5)
WBC: 3.4 K/uL — ABNORMAL LOW (ref 4.0–10.5)
nRBC: 0 % (ref 0.0–0.2)

## 2024-05-28 LAB — COMPREHENSIVE METABOLIC PANEL WITH GFR
ALT: 27 U/L (ref 0–44)
AST: 46 U/L — ABNORMAL HIGH (ref 15–41)
Albumin: 2.6 g/dL — ABNORMAL LOW (ref 3.5–5.0)
Alkaline Phosphatase: 120 U/L (ref 38–126)
Anion gap: 6 (ref 5–15)
BUN: 17 mg/dL (ref 8–23)
CO2: 22 mmol/L (ref 22–32)
Calcium: 8.6 mg/dL — ABNORMAL LOW (ref 8.9–10.3)
Chloride: 109 mmol/L (ref 98–111)
Creatinine, Ser: 0.85 mg/dL (ref 0.44–1.00)
GFR, Estimated: >60 mL/min (ref >60)
Glucose, Bld: 74 mg/dL (ref 70–99)
Potassium: 4.2 mmol/L (ref 3.5–5.1)
Sodium: 137 mmol/L (ref 135–145)
Total Bilirubin: 1.2 mg/dL (ref 0.0–1.2)
Total Protein: 5.4 g/dL — ABNORMAL LOW (ref 6.5–8.1)

## 2024-05-28 LAB — AMMONIA: Ammonia: 65 umol/L — ABNORMAL HIGH (ref 9–35)

## 2024-05-28 SURGERY — EGD (ESOPHAGOGASTRODUODENOSCOPY)
Anesthesia: Monitor Anesthesia Care

## 2024-05-28 MED ORDER — SODIUM CHLORIDE 0.9 % IV SOLN
500.0000 mL | Freq: Once | INTRAVENOUS | Status: AC
  Administered 2024-05-28: 500 mL via INTRAVENOUS

## 2024-05-28 MED ORDER — MIDODRINE HCL 5 MG PO TABS
10.0000 mg | ORAL_TABLET | Freq: Three times a day (TID) | ORAL | Status: DC
  Administered 2024-05-28 – 2024-05-29 (×4): 10 mg via ORAL
  Filled 2024-05-28 (×4): qty 2

## 2024-05-28 MED ORDER — PROPOFOL 500 MG/50ML IV EMUL
INTRAVENOUS | Status: AC
  Filled 2024-05-28: qty 50

## 2024-05-28 MED ORDER — PANTOPRAZOLE SODIUM 40 MG PO TBEC
40.0000 mg | DELAYED_RELEASE_TABLET | Freq: Two times a day (BID) | ORAL | 0 refills | Status: DC
Start: 2024-05-28 — End: 2024-08-02

## 2024-05-28 MED ORDER — PROPOFOL 10 MG/ML IV BOLUS
INTRAVENOUS | Status: AC
  Filled 2024-05-28: qty 20

## 2024-05-28 MED ORDER — SODIUM CHLORIDE 0.9 % IV SOLN: INTRAVENOUS | Status: DC | PRN

## 2024-05-28 MED ORDER — PROPOFOL 10 MG/ML IV BOLUS
INTRAVENOUS | Status: DC | PRN
  Administered 2024-05-28 (×2): 20 mg via INTRAVENOUS

## 2024-05-28 MED ORDER — FERROUS SULFATE 325 (65 FE) MG PO TABS: 325.0000 mg | ORAL_TABLET | Freq: Two times a day (BID) | ORAL | 0 refills | Status: AC

## 2024-05-28 MED ORDER — MIDODRINE HCL 10 MG PO TABS: 5.0000 mg | ORAL_TABLET | Freq: Three times a day (TID) | ORAL | 0 refills | Status: DC

## 2024-05-28 MED ORDER — PROPOFOL 500 MG/50ML IV EMUL
INTRAVENOUS | Status: DC | PRN
  Administered 2024-05-28: 100 ug/kg/min via INTRAVENOUS

## 2024-05-28 MED ORDER — ENSURE PLUS HIGH PROTEIN PO LIQD
237.0000 mL | Freq: Two times a day (BID) | ORAL | 0 refills | Status: DC
Start: 2024-05-28 — End: 2024-06-19

## 2024-05-28 MED ORDER — ALBUMIN HUMAN 25 % IV SOLN
25.0000 g | Freq: Once | INTRAVENOUS | Status: AC
  Administered 2024-05-28: 12.5 g via INTRAVENOUS
  Filled 2024-05-28: qty 100

## 2024-05-28 MED ORDER — MIDODRINE HCL 5 MG PO TABS
5.0000 mg | ORAL_TABLET | ORAL | Status: AC
  Administered 2024-05-28: 5 mg via ORAL
  Filled 2024-05-28: qty 1

## 2024-05-28 MED ORDER — MIDODRINE HCL 5 MG PO TABS
5.0000 mg | ORAL_TABLET | Freq: Three times a day (TID) | ORAL | Status: DC
  Administered 2024-05-28: 5 mg via ORAL
  Filled 2024-05-28: qty 1

## 2024-05-28 NOTE — Progress Notes (Signed)
 PROGRESS NOTE    Patient: [name]                                    PCP: [name], NP                    DOB: 1951-07-30            DOA: 05/20/2024 FMW:[id number]             DOS: 05/28/2024, 10:30 AM   LOS: 8 days   Date of Service: The patient was seen and examined on 05/28/2024  Subjective:   The patient was seen and examined this morning, stable no acute distress s/p EGD mildly somnolent otherwise easily arousable awake alert oriented x 3 Husband present at bedside No issues over the night Blood pressure stable on midodrine  Satting 98% on room air  Brief Narrative:   [name] is a 73 year old female with liver cirrhosis, prior EV's and GI bleed, status post TIPS, PAF not on anticoagulation, hypothyroidism, anemia of chronic disease who comes into the hospital with increased confusion. She is being brought by her husband. She has had prior hospitalizations for hepatic encephalopathy. Husband reports that she has done this before, and has progressively gotten worse over the last week. She was found to have an ammonia level almost 200. She has no white count, no fever, clinically without signs of having an infection or GI bleed at this point   Assesement and Plan: Principal Problem:   Acute metabolic encephalopathy Active Problems:   AKI (acute kidney injury) (HCC)   Cirrhosis (HCC)   Acquired hypothyroidism   Hepatic encephalopathy (HCC)   Anemia of chronic disease   SIRS (systemic inflammatory response syndrome) (HCC)   Acute prerenal azotemia   Paroxysmal atrial fibrillation (HCC)     Principal problem:  Hepatic encephalopathy, underlying decompensated liver cirrhosis  Hyperammonemia - Improved, back to baseline  - Episodes of encephalopathy, stiffness,-stroke workup-negative -CT and MRI of the brain have been reviewed, no acute abnormalities, chronic changes -Neurology was consulted, EEG no signs of seizures -Husband present at bedside stating  patient is at baseline  - Continue with lactulose  oral/enema  -Aammonia level low at 61>>> 48 >> 43>> 80>> 65  POA: progressive confusion over the past week, currently presentation consistent with hepatic encephalopathy.  -Husband does not want NG tube so we will continue enemas - No evidence of an acute infectious process, no evidence of GI bleed - Continue neurochecks   GI bleed with history of liver cirrhosis and esophageal varices, history of TIPS procedure  - Thus far on this admission s/p 3U PRBC blood transfusion -Hemoccult positive x 2 -Gastroenterologist was consulted  - Ultrasound TIPS Doppler study revealed: Patent TIPS. Decreased velocities in the portal vein and tips compared to prior 2. Cholelithiasis.  05/28/2024, s/p EGD, tolerated procedure well, finding consistent with multiple small gastric ulcers-likely responsible for acute bleeding, positive Hemoccult To continue Protonix  40 mg p.o. twice daily  Acute on chronic anemia - anemia of liver disease-with iron deficiency then GI bleed -Hemoglobin stabilized - S/p 1U PRBC transfusion 05/23/2024 - No signs of bleeding, transfusing 2U PRBC 05/25/2024    Latest Ref Rng & Units 05/28/2024    2:51 AM 05/27/2024    8:20 AM 05/26/2024    3:26 AM  CBC  WBC 4.0 - 10.5 K/uL 3.4  3.0  3.2  Hemoglobin 12.0 - 15.0 g/dL 89.6  89.5  89.9   Hematocrit 36.0 - 46.0 % 32.2  31.8  31.2   Platelets 150 - 400 K/uL 138  145  125     Total iron 26, TIBC 265, percent sat 10 ferritin 17, folate 20.7 B12 972 - Will initiate oral iron supplements  Hypoalbuminemia  - Replacing with 25 g of IV albumin  05/27/24 - 05/28/2024, transfusing another 25 g of IV albumin   Hypotensive  -Midodrine  10 mg p.o. 3 times daily initiated, blood pressure stabilized Monitoring closely -Status post 2U PRBC transfusion on 05/25/2024     Active problems:  Paroxysmal A-fib - stable, rate controlled, not on anticoagulation -due to recurrent GI bleeds   Hypothyroidism-continue Synthroid .  TSH normal AKI -monitoring BUN/creatinine stable Hypernatremia -i improved Hypokalemia -monitoring and replating orally   Stable today, transferring out of ICU/stepdown unit     ----------------------------------------------------------------------------------------------------------------------------------------------- Nutritional status:  The patient's BMI is: Body mass index is 28.22 kg/m. I agree with the assessment and plan as outlined  ------------------------------------------------------------------------------------------------------------------------------------------------  DVT prophylaxis:  SCDs Start: 05/20/24 2134   Code Status:   Code Status: Full Code  Family Communication: Husband present at bedside, discussed the findings, plan of care -Advance care planning has been discussed.   Consults: GI/neurology  admission status:   Status is: Inpatient Remains inpatient appropriate because: Needing close observation, as needed blood transfusion, rectal lactulose , continues evaluation for encephalopathy   Disposition: From  - home             Planning for discharge in a.m. if remains stable Replacing albumin , monitoring BP today  Procedures:   No admission procedures for hospital encounter.   Antimicrobials:  Anti-infectives (From admission, onward)    Start     Dose/Rate Route Frequency Ordered Stop   05/21/24 1000  rifaximin  (XIFAXAN ) tablet 550 mg        550 mg Oral 2 times daily 05/21/24 0705          Medication:   [START ON 05/29/2024] aspirin  EC  81 mg Oral Daily   Chlorhexidine  Gluconate Cloth  6 each Topical Daily   feeding supplement  237 mL Oral BID BM   ferrous sulfate   325 mg Oral BID WC   lactulose   30 g Oral TID   levothyroxine   25 mcg Oral Q0600   midodrine   10 mg Oral TID WC   pantoprazole   40 mg Oral BID   potassium chloride   40 mEq Oral Daily   rifaximin   550 mg Oral BID    acetaminophen   **OR** acetaminophen , lip balm, melatonin, metoprolol  tartrate, ondansetron  (ZOFRAN ) IV   Objective:   Vitals:   05/28/24 0731 05/28/24 0830 05/28/24 0845 05/28/24 0855  BP: (!) 99/59 (!) 99/58 (!) 99/58 (!) 112/54  Pulse:  (!) 59 (!) 59 (!) 58  Resp: 12 20 20 13   Temp: 98.6 F (37 C) (!) 97.4 F (36.3 C) (!) 97.4 F (36.3 C) (!) 97.3 F (36.3 C)  TempSrc: Temporal     SpO2: 98% 100% 98% 98%  Weight: 70.9 kg     Height: 5' 2.4 (1.585 m)       Intake/Output Summary (Last 24 hours) at 05/28/2024 1030 Last data filed at 05/28/2024 0847 Gross per 24 hour  Intake 713.93 ml  Output 395 ml  Net 318.93 ml   Filed Weights   05/27/24 0500 05/28/24 0510 05/28/24 0731  Weight: 72 kg 70.9 kg 70.9 kg     Physical examination:  General:  AAO x 3,  cooperative, no distress;   HEENT:  Normocephalic, PERRL, otherwise with in Normal limits   Neuro:  CNII-XII intact. , normal motor and sensation, reflexes intact   Lungs:   Clear to auscultation BL, Respirations unlabored,  No wheezes / crackles  Cardio:    S1/S2, RRR, No murmure, No Rubs or Gallops   Abdomen:  Soft, non-tender, bowel sounds active all four quadrants, no guarding or peritoneal signs.  Muscular  skeletal:  Limited exam -global generalized weaknesses - in bed, able to move all 4 extremities,   2+ pulses,  symmetric, No pitting edema  Skin:  Dry, warm to touch, negative for any Rashes,  Wounds: Please see nursing documentation        ----------------------------------------------------------------------------------------------------------------------    LABs:     Latest Ref Rng & Units 05/28/2024    2:51 AM 05/27/2024    8:20 AM 05/26/2024    3:26 AM  CBC  WBC 4.0 - 10.5 K/uL 3.4  3.0  3.2   Hemoglobin 12.0 - 15.0 g/dL 89.6  89.5  89.9   Hematocrit 36.0 - 46.0 % 32.2  31.8  31.2   Platelets 150 - 400 K/uL 138  145  125       Latest Ref Rng & Units 05/28/2024    2:51 AM 05/26/2024    3:27 AM 05/25/2024     3:19 AM  CMP  Glucose 70 - 99 mg/dL 74  84  859   BUN 8 - 23 mg/dL 17  15  17    Creatinine 0.44 - 1.00 mg/dL 9.14  9.12  9.25   Sodium 135 - 145 mmol/L 137  137  139   Potassium 3.5 - 5.1 mmol/L 4.2  3.2  3.6   Chloride 98 - 111 mmol/L 109  108  116   CO2 22 - 32 mmol/L 22  21  21    Calcium  8.9 - 10.3 mg/dL 8.6  8.3  7.8   Total Protein 6.5 - 8.1 g/dL 5.4  5.1  4.7   Total Bilirubin 0.0 - 1.2 mg/dL 1.2  1.2  0.8   Alkaline Phos 38 - 126 U/L 120  117  90   AST 15 - 41 U/L 46  42  36   ALT 0 - 44 U/L 27  28  25         Micro Results Recent Results (from the past 240 hours)  MRSA Next Gen by PCR, Nasal     Status: None   Collection Time: 05/21/24  1:27 PM   Specimen: Nasal Mucosa; Nasal Swab  Result Value Ref Range Status   MRSA by PCR Next Gen NOT DETECTED NOT DETECTED Final    Comment: (NOTE) The GeneXpert MRSA Assay (FDA approved for NASAL specimens only), is one component of a comprehensive MRSA colonization surveillance program. It is not intended to diagnose MRSA infection nor to guide or monitor treatment for MRSA infections. Test performance is not FDA approved in patients less than 76 years old. Performed at [facility], [address], [place], [place] [address]     Radiology Reports No results found.   SIGNED: Adriana DELENA Grams, MD, FHM. FAAFP. [facility] - [facility] Critical care time spent - 55 min.  In seeing, evaluating and examining the patient. Reviewing medical records, labs, drawn plan of care. [facility],  Pager (please use [email or web address] to page/ text) Please use Epic Secure Chat for non-urgent communication (7AM-7PM)  If 7PM-7AM,  please contact night-coverage [email or web address], 05/28/2024, 10:30 AM

## 2024-05-28 NOTE — Op Note (Signed)
 [facility] Patient Name: [name] Procedure Date: 05/28/2024 MRN: [id number] Attending MD: Gordy CHRISTELLA Starch , MD, [id number] Date of Birth: 02-25-1951 CSN: [id number] Age: 73 Admit Type: Inpatient Procedure:                Upper GI endoscopy Indications:              Heme positive stool, hx of cirrhosis with portal                            HTN s/p TIPS for remote variceal bleeding, recently                            declining Hgb and recurrent hepatic encephalopathy                            without clear etiology Providers:                Gordy CHRISTELLA. Starch, MD, Randall Lines, RN, Haskel Chris, Technician Referring MD:             [facility] Medicines:                Monitored Anesthesia Care Complications:            No immediate complications. Estimated Blood Loss:     Estimated blood loss was minimal. Procedure:                Pre-Anesthesia Assessment:                           - Prior to the procedure, a History and Physical                            was performed, and patient medications and                            allergies were reviewed. The patient's tolerance of                            previous anesthesia was also reviewed. The risks                            and benefits of the procedure and the sedation                            options and risks were discussed with the patient.                            All questions were answered, and informed consent                            was obtained. Prior Anticoagulants: The patient has  taken no anticoagulant or antiplatelet agents. ASA                            Grade Assessment: III - A patient with severe                            systemic disease. After reviewing the risks and                            benefits, the patient was deemed in satisfactory                            condition to undergo the procedure.                            After obtaining informed consent, the endoscope was                            passed under direct vision. Throughout the                            procedure, the patient's blood pressure, pulse, and                            oxygen saturations were monitored continuously. The                            [id number]-[id number] ([id number]) Olympus endoscope was introduced                            through the mouth, and advanced to the second part                            of duodenum. The upper GI endoscopy was                            accomplished without difficulty. The patient                            tolerated the procedure well. Scope In: Scope Out: Findings:      The examined esophagus was normal.      A small hiatal hernia was present.      The gastroesophageal flap valve was visualized endoscopically and       classified as Hill Grade III (minimal fold, loose to endoscope, hiatal       hernia likely).      Few non-bleeding linear and superficial gastric ulcers with no stigmata       of bleeding were found in the gastric antrum and in the prepyloric       region of the stomach. The largest lesion was 5 mm in largest dimension.       Biopsies were taken with a cold forceps for Helicobacter pylori testing.      The examined duodenum was normal. Impression:               -  Normal esophagus.                           - Small hiatal hernia.                           - No esophageal or gastric varices.                           - Non-bleeding gastric ulcers with antral gastritis                            Biopsied.                           - Normal examined duodenum. Moderate Sedation:      N/A Recommendation:           - Return patient to hospital ward for ongoing care.                           - Low sodium diet.                           - Continue present medications.                           - Pantoprazole  40 mg BID-AC x 4 weeks then once                            daily x  4 weeks. Given aspirin  use could consider                            long-term once daily use, unless H. Pylori is found                            on the biopsies and it is successfully treated.                           - Await pathology results.                           - Continue lactulose  and rifaximin .                           - Expect discharge home soon with close interval                            followup with [facility]. Procedure Code(s):        --- Professional ---                           [phone], Esophagogastroduodenoscopy, flexible,                            transoral; with biopsy, single or multiple Diagnosis Code(s):        --- Professional ---  K44.9, Diaphragmatic hernia without obstruction or                            gangrene                           K25.9, Gastric ulcer, unspecified as acute or                            chronic, without hemorrhage or perforation                           R19.5, Other fecal abnormalities CPT copyright 2022 American Medical Association. All rights reserved. The codes documented in this report are preliminary and upon coder review may  be revised to meet current compliance requirements. Gordy CHRISTELLA Starch, MD 05/28/2024 8:41:19 AM This report has been signed electronically. Number of Addenda: 0

## 2024-05-28 NOTE — Plan of Care (Signed)
 Blood pressure was low/marginal after returning from endoscopy, administered 500 mL bolus of NS and the midodrine  and albumin  she missed while in the procedure.  Midodrine  dose was increased to 10 mg TID to which she has responded well, maintaining SBP greater than 100 the rest of the evening.  DBP remains very low so transfer to medical bed has been postponed.  Per MD, she may discharge home tomorrow.  Has been up to [facility] with standby assist.

## 2024-05-28 NOTE — Interval H&P Note (Signed)
 History and Physical Interval Note: For upper endoscopy today in the setting of portal hypertension with recent drifting hemoglobin and heme positive stool The nature of the procedure, as well as the risks, benefits, and alternatives were carefully and thoroughly reviewed with the patient. Ample time for discussion and questions allowed. The patient understood, was satisfied, and agreed to proceed.      Latest Ref Rng & Units 05/28/2024    2:51 AM 05/27/2024    8:20 AM 05/26/2024    3:26 AM  CBC  WBC 4.0 - 10.5 K/uL 3.4  3.0  3.2   Hemoglobin 12.0 - 15.0 g/dL 89.6  89.5  89.9   Hematocrit 36.0 - 46.0 % 32.2  31.8  31.2   Platelets 150 - 400 K/uL 138  145  125      05/28/2024 8:13 AM  [name]  has presented today for surgery, with the diagnosis of Anemia, Hemoccult positive.  The various methods of treatment have been discussed with the patient and family. After consideration of risks, benefits and other options for treatment, the patient has consented to  Procedure(s): EGD (ESOPHAGOGASTRODUODENOSCOPY) (N/A) as a surgical intervention.  The patient's history has been reviewed, patient examined, no change in status, stable for surgery.  I have reviewed the patient's chart and labs.  Questions were answered to the patient's satisfaction.     Gordy HERO Annaleigh Steinmeyer

## 2024-05-28 NOTE — Transfer of Care (Signed)
 Immediate Anesthesia Transfer of Care Note  Patient: [name]  Procedure(s) Performed: EGD (ESOPHAGOGASTRODUODENOSCOPY)  Patient Location: PACU  Anesthesia Type:MAC  Level of Consciousness: awake, alert , and patient cooperative  Airway & Oxygen Therapy: Patient Spontanous Breathing and Patient connected to face mask oxygen  Post-op Assessment: Report given to RN and Post -op Vital signs reviewed and stable  Post vital signs: Reviewed and stable  Last Vitals:  Vitals Value Taken Time  BP 99/58 05/28/24 08:45  Temp    Pulse 59 05/28/24 08:46  Resp 21 05/28/24 08:46  SpO2 100 % 05/28/24 08:46  Vitals shown include unfiled device data.  Last Pain:  Vitals:   05/28/24 0731  TempSrc: Temporal  PainSc: 0-No pain      Patients Stated Pain Goal: 0 (05/28/24 0731)  Complications: No notable events documented.

## 2024-05-28 NOTE — Anesthesia Postprocedure Evaluation (Signed)
 Anesthesia Post Note  Patient: [name]  Procedure(s) Performed: EGD (ESOPHAGOGASTRODUODENOSCOPY)     Patient location during evaluation: PACU Anesthesia Type: MAC Level of consciousness: awake and alert Pain management: pain level controlled Vital Signs Assessment: post-procedure vital signs reviewed and stable Respiratory status: spontaneous breathing, nonlabored ventilation and respiratory function stable Cardiovascular status: stable and blood pressure returned to baseline Postop Assessment: no apparent nausea or vomiting Anesthetic complications: no   No notable events documented.  Last Vitals:  Vitals:   05/28/24 0845 05/28/24 0855  BP: (!) 99/58 (!) 112/54  Pulse: (!) 59 (!) 58  Resp: 20 13  Temp: (!) 36.3 C (!) 36.3 C  SpO2: 98% 98%    Last Pain:  Vitals:   05/28/24 0731  TempSrc: Temporal  PainSc: 0-No pain                 Da Authement A.

## 2024-05-28 NOTE — Progress Notes (Signed)
 Patient taken to [facility] via bed with Nurse and husband.

## 2024-05-28 NOTE — Anesthesia Procedure Notes (Signed)
 Procedure Name: MAC Date/Time: 05/28/2024 8:19 AM  Performed by: Dasie Nena PARAS, CRNAPre-anesthesia Checklist: Patient identified, Emergency Drugs available, Suction available, Patient being monitored and Timeout performed Oxygen Delivery Method: Simple face mask Preoxygenation: POM used. Placement Confirmation: positive ETCO2

## 2024-05-29 ENCOUNTER — Other Ambulatory Visit (HOSPITAL_COMMUNITY): Payer: Self-pay

## 2024-05-29 ENCOUNTER — Encounter (HOSPITAL_COMMUNITY): Payer: Self-pay | Admitting: Internal Medicine

## 2024-05-29 ENCOUNTER — Encounter: Payer: Self-pay | Admitting: Internal Medicine

## 2024-05-29 ENCOUNTER — Other Ambulatory Visit (HOSPITAL_BASED_OUTPATIENT_CLINIC_OR_DEPARTMENT_OTHER): Payer: Self-pay

## 2024-05-29 DIAGNOSIS — G9341 Metabolic encephalopathy: Secondary | ICD-10-CM | POA: Diagnosis not present

## 2024-05-29 LAB — TYPE AND SCREEN
ABO/RH(D): O POS
Antibody Screen: NEGATIVE
Unit division: 0
Unit division: 0
Unit division: 0

## 2024-05-29 LAB — BPAM RBC
Blood Product Expiration Date: 202508032359
Blood Product Expiration Date: 202508052359
Blood Product Expiration Date: 202508052359
ISSUE DATE / TIME: 202507030955
ISSUE DATE / TIME: 202507031233
Unit Type and Rh: 5100
Unit Type and Rh: 5100
Unit Type and Rh: 5100

## 2024-05-29 LAB — COMPREHENSIVE METABOLIC PANEL WITH GFR
ALT: 26 U/L (ref 0–44)
AST: 42 U/L — ABNORMAL HIGH (ref 15–41)
Albumin: 2.6 g/dL — ABNORMAL LOW (ref 3.5–5.0)
Alkaline Phosphatase: 103 U/L (ref 38–126)
Anion gap: 7 (ref 5–15)
BUN: 15 mg/dL (ref 8–23)
CO2: 20 mmol/L — ABNORMAL LOW (ref 22–32)
Calcium: 8.4 mg/dL — ABNORMAL LOW (ref 8.9–10.3)
Chloride: 109 mmol/L (ref 98–111)
Creatinine, Ser: 0.89 mg/dL (ref 0.44–1.00)
GFR, Estimated: >60 mL/min (ref >60)
Glucose, Bld: 77 mg/dL (ref 70–99)
Potassium: 4.2 mmol/L (ref 3.5–5.1)
Sodium: 136 mmol/L (ref 135–145)
Total Bilirubin: 1.2 mg/dL (ref 0.0–1.2)
Total Protein: 5.1 g/dL — ABNORMAL LOW (ref 6.5–8.1)

## 2024-05-29 LAB — CBC
HCT: 29.2 % — ABNORMAL LOW (ref 36.0–46.0)
Hemoglobin: 9.4 g/dL — ABNORMAL LOW (ref 12.0–15.0)
MCH: 31.4 pg (ref 26.0–34.0)
MCHC: 32.2 g/dL (ref 30.0–36.0)
MCV: 97.7 fL (ref 80.0–100.0)
Platelets: 132 K/uL — ABNORMAL LOW (ref 150–400)
RBC: 2.99 MIL/uL — ABNORMAL LOW (ref 3.87–5.11)
RDW: 18.4 % — ABNORMAL HIGH (ref 11.5–15.5)
WBC: 3.3 K/uL — ABNORMAL LOW (ref 4.0–10.5)
nRBC: 0 % (ref 0.0–0.2)

## 2024-05-29 LAB — CORTISOL: Cortisol, Plasma: 2.2 ug/dL

## 2024-05-29 LAB — T4, FREE: Free T4: 0.64 ng/dL (ref 0.61–1.12)

## 2024-05-29 MED ORDER — HYDROCORTISONE SOD SUC (PF) 100 MG IJ SOLR
100.0000 mg | Freq: Once | INTRAMUSCULAR | Status: AC
  Administered 2024-05-29: 100 mg via INTRAVENOUS
  Filled 2024-05-29: qty 2

## 2024-05-29 MED ORDER — LEVOTHYROXINE SODIUM 50 MCG PO TABS
50.0000 ug | ORAL_TABLET | Freq: Every day | ORAL | 1 refills | Status: DC
  Filled 2024-05-29 (×3): qty 30, 30d supply, fill #0

## 2024-05-29 MED ORDER — LEVOTHYROXINE SODIUM 25 MCG PO TABS: 50.0000 ug | ORAL_TABLET | Freq: Every day | ORAL | Status: DC

## 2024-05-29 MED ORDER — METHYLPREDNISOLONE 4 MG PO TBPK
ORAL_TABLET | ORAL | 0 refills | Status: DC
  Filled 2024-05-29: qty 21, 6d supply, fill #0

## 2024-05-29 MED ORDER — MIDODRINE HCL 10 MG PO TABS
10.0000 mg | ORAL_TABLET | Freq: Three times a day (TID) | ORAL | 0 refills | Status: DC
  Filled 2024-05-29: qty 90, 30d supply, fill #0

## 2024-05-29 MED ORDER — HYDROCORTISONE SOD SUC (PF) 100 MG IJ SOLR
100.0000 mg | Freq: Every day | INTRAMUSCULAR | 0 refills | Status: DC
  Filled 2024-05-29: qty 6, 3d supply, fill #0

## 2024-05-29 MED ORDER — MIDODRINE HCL 10 MG PO TABS
10.0000 mg | ORAL_TABLET | Freq: Three times a day (TID) | ORAL | 0 refills | Status: DC
  Filled 2024-05-29 (×2): qty 90, 30d supply, fill #0

## 2024-05-29 MED ORDER — LEVOTHYROXINE SODIUM 50 MCG PO TABS
50.0000 ug | ORAL_TABLET | Freq: Every day | ORAL | 1 refills | Status: DC
  Filled 2024-05-29: qty 30, 30d supply, fill #0

## 2024-05-29 NOTE — Plan of Care (Signed)
 Patient for possible discharge in AM, patient getting up to bedside commode with minimal assist, plan of care and goals discusses time given for questions, patient handbook/guide at bedside, husband at bedside.  Problem: Education: Goal: Knowledge of General Education information will improve Description: Including pain rating scale, medication(s)/side effects and non-pharmacologic comfort measures Outcome: Progressing   Problem: Health Behavior/Discharge Planning: Goal: Ability to manage health-related needs will improve Outcome: Progressing   Problem: Clinical Measurements: Goal: Ability to maintain clinical measurements within normal limits will improve Outcome: Progressing Goal: Will remain free from infection Outcome: Progressing Goal: Diagnostic test results will improve Outcome: Progressing Goal: Respiratory complications will improve Outcome: Progressing Goal: Cardiovascular complication will be avoided Outcome: Progressing   Problem: Activity: Goal: Risk for activity intolerance will decrease Outcome: Progressing   Problem: Nutrition: Goal: Adequate nutrition will be maintained Outcome: Progressing   Problem: Coping: Goal: Level of anxiety will decrease Outcome: Progressing   Problem: Elimination: Goal: Will not experience complications related to bowel motility Outcome: Progressing Goal: Will not experience complications related to urinary retention Outcome: Progressing   Problem: Pain Managment: Goal: General experience of comfort will improve and/or be controlled Outcome: Progressing   Problem: Safety: Goal: Ability to remain free from injury will improve Outcome: Progressing   Problem: Skin Integrity: Goal: Risk for impaired skin integrity will decrease Outcome: Progressing

## 2024-05-29 NOTE — Progress Notes (Signed)
 TOC med in a secure bag delivered to patient in room

## 2024-05-29 NOTE — TOC Transition Note (Signed)
 Transition of Care [facility]) - Discharge Note   Patient Details  Name: Danaysha Kirn MRN: [id number] Date of Birth: [birth year]  [facility] CM/SW Contact:  Heather DELENA Saltness, LCSW Phone Number: 05/29/2024, 11:09 AM   Clinical Narrative:    CSW met with pt and spouse, Alonah Lineback, at bedside to discuss PT's recommendation for [facility] PT services upon discharge. Pt and spouse in agreement with beginning [facility] PT services. CSW spoke with Cindie at [facility] who confirmed ability to accept pt for [facility] PT services. No further TOC needs at this time.    Final next level of care: Home w Home Health Services Barriers to Discharge: Barriers Resolved   Patient Goals and CMS Choice Patient states their goals for this hospitalization and ongoing recovery are:: To return home CMS Medicare.gov Compare Post Acute Care list provided to:: Other (Comment Required) (NA) Choice offered to / list presented to : NA [place] ownership interest in [facility].provided to:: Parent NA    Discharge Placement  Home with [facility] services              Patient to be transferred to facility by: Spouse Name of family member notified: Valere Boers    Discharge Plan and Services Additional resources added to the After Visit Summary for   In-house Referral: NA Discharge Planning Services: CM Consult Post Acute Care Choice: NA          DME Arranged: N/A DME Agency: NA       HH Arranged: PT HH Agency: [facility] Date [facility] Agency Contacted: 05/29/24 Time HH Agency Contacted: [id number] Representative spoke with at [facility] Agency: Cindie  Social Drivers of Health (SDOH) Interventions SDOH Screenings   Food Insecurity: No Food Insecurity (05/21/2024)  Housing: Low Risk  (05/21/2024)  Transportation Needs: No Transportation Needs (05/21/2024)  Utilities: Not At Risk (05/21/2024)  Alcohol  Screen: Low Risk  (03/27/2024)  Depression (PHQ2-9): Low Risk  (05/11/2024)  Financial Resource Strain: Low Risk   (05/10/2024)  Physical Activity: Insufficiently Active (05/10/2024)  Social Connections: Socially Integrated (05/10/2024)  Recent Concern: Social Connections - Moderately Isolated (03/30/2024)  Stress: No Stress Concern Present (05/10/2024)  Tobacco Use: Low Risk  (05/28/2024)     Readmission Risk Interventions    05/29/2024   11:06 AM 05/22/2024    3:58 PM 03/31/2024    2:51 PM  Readmission Risk Prevention Plan  Transportation Screening Complete Complete Complete  PCP or Specialist Appt within 3-5 Days   Complete  HRI or Home Care Consult   Complete  Social Work Consult for Recovery Care Planning/Counseling   Complete  Palliative Care Screening   Not Applicable  Medication Review Oceanographer) Complete Complete Complete  PCP or Specialist appointment within 3-5 days of discharge Complete    HRI or Home Care Consult Complete Complete   SW Recovery Care/Counseling Consult Complete Complete   Palliative Care Screening Not Applicable Not Applicable   Skilled Nursing Facility Not Applicable Not Applicable      Heather Saltness, MSW, LCSW 05/29/2024 11:10 AM

## 2024-05-29 NOTE — Progress Notes (Signed)
 PT Cancellation Note  Patient Details Name: [name] MRN: [id number] DOB: 1950-12-07   Cancelled Treatment:    Reason Eval/Treat Not Completed: Other (comment). Pt to d/c home today. PT provided pt and family ed on [facility] services. PT to continue to follow acutely if pt remains in hospital   [place], [place] [facility]   [name] 05/29/2024, 10:32 AM

## 2024-05-29 NOTE — Discharge Summary (Addendum)
 Physician Discharge Summary   Patient: [name] MRN: [id number] DOB: [birth year]  Admit date:     05/20/2024  Discharge date: 05/29/24  Discharge Physician: Adriana DELENA Grams   PCP: [name], NP   Recommendations at discharge:    Follow-up with PCP in 2-4 weeks Follow-up with endocrinologist in 1-2 weeks (please make a note to increase dose of Synthroid  due to elevated TSH during hospitalization) - Follow-up with gastroenterologist in 2-4 weeks As her BP improves, her new medication midodrine  may be reduced in dose and discontinue in the next 1-2 weeks Continue monitoring BP daily (keep log) Ambulate with caution, fall precautions Continue current recommended medication and scheduled lactulose   Discharge Diagnoses: Principal Problem:   Acute metabolic encephalopathy Active Problems:   AKI (acute kidney injury) (HCC)   Cirrhosis (HCC)   Acquired hypothyroidism   Hepatic encephalopathy (HCC)   Anemia of chronic disease   SIRS (systemic inflammatory response syndrome) (HCC)   Acute prerenal azotemia   Paroxysmal atrial fibrillation (HCC)   Acute on chronic anemia   Heme positive stool   Acute gastric ulcer without hemorrhage or perforation   Gastritis and gastroduodenitis  Resolved Problems:   * No resolved hospital problems. [facility] Course: [name] is a 73 year old female with liver cirrhosis, prior EV's and GI bleed, status post TIPS, PAF not on anticoagulation, hypothyroidism, anemia of chronic disease who comes into the hospital with increased confusion. She is being brought by her husband. She has had prior hospitalizations for hepatic encephalopathy. Husband reports that she has done this before, and has progressively gotten worse over the last week. She was found to have an ammonia level almost 200. She has no white count, no fever, clinically without signs of having an infection or GI bleed at this point   Hepatic encephalopathy, underlying  decompensated liver cirrhosis  Hyperammonemia - Improved, back to baseline  - Episodes of encephalopathy, stiffness,-stroke workup-negative -CT and MRI of the brain have been reviewed, no acute abnormalities, chronic changes -Neurology was consulted, EEG no signs of seizures -Husband present at bedside stating patient is at baseline  - Continue with lactulose  oral/enema  -Aammonia level low at 61>>> 48 >> 43>> 80>> 65  POA: progressive confusion over the past week, currently presentation consistent with hepatic encephalopathy.  -Husband does not want NG tube so we will continue enemas - No evidence of an acute infectious process, no evidence of GI bleed - Continue neurochecks   GI bleed with history of liver cirrhosis and esophageal varices, history of TIPS procedure  - Thus far on this admission s/p 3U PRBC blood transfusion -Hemoccult positive x 2 -Gastroenterologist was consulted  - Ultrasound TIPS Doppler study revealed: Patent TIPS. Decreased velocities in the portal vein and tips compared to prior 2. Cholelithiasis.  05/28/2024, s/p EGD, tolerated procedure well, finding consistent with multiple small gastric ulcers-likely responsible for acute bleeding, positive Hemoccult To continue Protonix  40 mg p.o. twice daily  Acute on chronic anemia - anemia of liver disease-with iron deficiency then GI bleed -Hemoglobin stabilized - S/p 1U PRBC transfusion 05/23/2024 - No signs of bleeding, transfusing 2U PRBC 05/25/2024    Latest Ref Rng & Units 05/29/2024    3:23 AM 05/28/2024    2:51 AM 05/27/2024    8:20 AM  CBC  WBC 4.0 - 10.5 K/uL 3.3  3.4  3.0   Hemoglobin 12.0 - 15.0 g/dL 9.4  89.6  89.5   Hematocrit 36.0 - 46.0 % 29.2  32.2  31.8   Platelets 150 - 400 K/uL 132  138  145       Total iron 26, TIBC 265, percent sat 10 ferritin 17, folate 20.7 B12 972 - Will initiate oral iron supplements  Hypoalbuminemia  - Replacing with 25 g of IV albumin  05/27/24 - 05/28/2024,  transfusing another 25 g of IV albumin   Hypotensive  -Midodrine  10 mg p.o. 3 times daily initiated, blood pressure stabilized Monitoring closely -Status post 2U PRBC transfusion on 05/25/2024     Adrenal sufficiency  AM cortisol level at 2.2 Solu-Cortef  100 mg daily, Rx for 3 more days   active problems:  Paroxysmal A-fib - stable, rate controlled, not on anticoagulation -due to recurrent GI bleeds  Hypothyroidism-continue Synthroid .  TSH elevated-increasing Synthroid  from 25 to 50 mcg daily Patient to follow-up with endocrinologist in 1-2 weeks, rechecking thyroid  function, adjusting Synthroid  accordingly  AKI -monitoring BUN/creatinine stable Hypernatremia -i improved Hypokalemia -monitoring and replating orally    Consultants: Gastroenterologist Procedures performed: EGD Disposition: Home Diet recommendation:  Discharge Diet Orders (From admission, onward)     Start     Ordered   05/28/24 0000  Diet - low sodium heart healthy        05/28/24 1024           Regular diet DISCHARGE MEDICATION: Allergies as of 05/29/2024       Reactions   Contrast Media [iodinated Contrast Media] Hives        Medication List     STOP taking these medications    polyethylene glycol 17 g packet Commonly known as: MIRALAX  / GLYCOLAX    spironolactone  25 MG tablet Commonly known as: ALDACTONE        TAKE these medications    Accu-Chek Softclix Lancets lancets 3 (three) times daily.   aspirin  EC 81 MG tablet Take 1 tablet (81 mg total) by mouth daily. Swallow whole. What changed: when to take this   Blood Glucose Monitoring Suppl Devi 1 each by Does not apply route in the morning, at noon, and at bedtime. May substitute to any manufacturer covered by patient's insurance.   CRANBERRY PO Take 1 tablet by mouth 2 (two) times daily.   feeding supplement Liqd Take 237 mLs by mouth 2 (two) times daily between meals.   ferrous sulfate  325 (65 FE) MG tablet Take 1  tablet (325 mg total) by mouth 2 (two) times daily with a meal.   furosemide  20 MG tablet Commonly known as: LASIX  Take 20-40 mg by mouth See admin instructions. Take blood pressure and depending on reading take 20mg  or 40mg  by mouth daily.   hydrocortisone  sodium succinate  100 MG injection Commonly known as: Solu-CORTEF  Inject 2 mLs (100 mg total) into the vein daily for 3 days. Start taking on: May 30, 2024   lactulose  10 GM/15ML solution Commonly known as: CHRONULAC  Take 45 mLs (30 g total) by mouth 3 (three) times daily. What changed: how much to take   levOCARNitine  330 MG tablet Commonly known as: CARNITOR  Take 330 mg by mouth 3 (three) times daily.   levothyroxine  50 MCG tablet Commonly known as: SYNTHROID  Take 1 tablet (50 mcg total) by mouth daily at 6 (six) AM. Start taking on: May 30, 2024 What changed:  medication strength how much to take when to take this   liver oil-zinc  oxide 40 % ointment Commonly known as: DESITIN Apply topically daily as needed for irritation.   magnesium  oxide 400 MG tablet Commonly known as: MAG-OX  Take 1 tablet (400 mg total) by mouth daily. What changed: when to take this   midodrine  10 MG tablet Commonly known as: PROAMATINE  Take 1 tablet (10 mg total) by mouth 3 (three) times daily with meals.   Multi-Vitamin tablet Take 1 tablet by mouth in the morning.   pantoprazole  40 MG tablet Commonly known as: PROTONIX  Take 1 tablet (40 mg total) by mouth 2 (two) times daily.   rifaximin  550 MG Tabs tablet Commonly known as: XIFAXAN  Take 1 tablet (550 mg total) by mouth 2 (two) times daily. What changed:  when to take this reasons to take this additional instructions               Discharge Care Instructions  (From admission, onward)           Start     Ordered   05/29/24 0000  Discharge wound care:       Comments: Per nursing instructions   05/29/24 0939   05/28/24 0000  Discharge wound care:       Comments:  Per RN instructions   05/28/24 1024            Discharge Exam: Filed Weights   05/28/24 0510 05/28/24 0731 05/29/24 0535  Weight: 70.9 kg 70.9 kg 70.9 kg        General:  AAO x 3,  cooperative, no distress;   HEENT:  Normocephalic, PERRL, otherwise with in Normal limits   Neuro:  CNII-XII intact. , normal motor and sensation, reflexes intact   Lungs:   Clear to auscultation BL, Respirations unlabored,  No wheezes / crackles  Cardio:    S1/S2, RRR, No murmure, No Rubs or Gallops   Abdomen:  Soft, non-tender, bowel sounds active all four quadrants, no guarding or peritoneal signs.  Muscular  skeletal:  Limited exam -global generalized weaknesses - in bed, able to move all 4 extremities,   2+ pulses,  symmetric, No pitting edema  Skin:  Dry, warm to touch, negative for any Rashes,  Wounds: Please see nursing documentation          Condition at discharge: fair  The results of significant diagnostics from this hospitalization (including imaging, microbiology, ancillary and laboratory) are listed below for reference.   Imaging Studies: US  ABDOMEN LIMITED WITH LIVER DOPPLER Result Date: 05/26/2024 CLINICAL DATA:  Cirrhosis with ascites. EXAM: DUPLEX ULTRASOUND OF LIVER TECHNIQUE: Color and duplex Doppler ultrasound was performed to evaluate the hepatic in-flow and out-flow vessels. COMPARISON:  Ultrasound 03/31/2024 FINDINGS: Liver: Coarsened hepatic echotexture No focal lesion, mass or intrahepatic biliary ductal dilatation. Gallbladder: No sonographic Murphy's sign. No wall thickening. Cholelithiasis. Portal Vein Velocities Main Prox:  21.9 cm/sec; previously 70 centimeters/second Right: 30.0 cm/sec; previously says 71 centimeters/second Left: 27.6 cm/sec; previously 41 centimeters/second TIPS Proximal (portal): 53.9 cm/s;  previously 74 cm/s MID: 57.1 cm/s; previously 78 cm/s Distal (hepatic vein): 36.4 cm/s; previously 96 cm/s Hepatic Vein Velocities Right:  16.9 cm/sec;  Middle:  20.8 cm/sec; Left:  15.8 cm/sec; IVC: Present and patent with normal respiratory phasicity. Hepatic Artery Velocity:  63.2 cm/sec Splenic Vein Velocity:  24.9 cm/sec Spleen: 11.2 cm x 4.6 cm x 3.5 cm Portal Vein Occlusion/Thrombus: No Splenic Vein Occlusion/Thrombus: No Ascites: None Varices: None IMPRESSION: 1. Patent TIPS. Decreased velocities in the portal vein and tips compared to prior 2. Cholelithiasis. Electronically Signed   By: Norman Gatlin M.D.   On: 05/26/2024 00:03   US  EKG SITE RITE Result Date: 05/25/2024 If [facility]  image not attached, placement could not be confirmed due to current cardiac rhythm.  CT HEAD WO CONTRAST ( ) Result Date: 05/24/2024 CLINICAL DATA:  Altered mental status. EXAM: CT HEAD WITHOUT CONTRAST TECHNIQUE: Contiguous axial images were obtained from the base of the skull through the vertex without intravenous contrast. RADIATION DOSE REDUCTION: This exam was performed according to the departmental dose-optimization program which includes automated exposure control, adjustment of the mA and/or kV according to patient size and/or use of iterative reconstruction technique. COMPARISON:  Same day MRI head.  CT head 05/20/2024. FINDINGS: Brain: No acute intracranial hemorrhage. Evolving left PCA territory infarct. No additional areas of infarct appreciated on CT. Nonspecific hypoattenuation in the periventricular and subcortical white matter favored to reflect chronic microvascular ischemic changes. No edema, mass effect, or midline shift. The basilar cisterns are patent. No extra-axial fluid collections. Ventricles: The ventricles are normal. Vascular: No hyperdense vessel or unexpected calcification. Skull: No acute or aggressive finding. Orbits: Bilateral lens replacement.  Orbits otherwise unremarkable. Sinuses: The visualized paranasal sinuses are clear. Other: Mastoid air cells are clear. IMPRESSION: Evolving left PCA territory infarct. No evidence of hemorrhagic  conversion. Chronic microvascular ischemic changes. Electronically Signed   By: Donnice Mania M.D.   On: 05/24/2024 21:58   MR BRAIN WO CONTRAST Result Date: 05/24/2024 CLINICAL DATA:  Mental status change, unknown cause EXAM: MRI HEAD WITHOUT CONTRAST TECHNIQUE: Multiplanar, multiecho pulse sequences of the brain and surrounding structures were obtained without intravenous contrast. COMPARISON:  MRI head April 23, 2024. FINDINGS: Moderately motion limited study.  Within this limitation: Brain: Evolution of previously seen left PCA territory infarcts which demonstrates decreased conspicuity of restricted diffusion and decreased edema. No evidence of new/interval acute infarct on motion limited assessment. No evidence of acute hemorrhage, mass lesion, midline shift or hydrocephalus. Similar remote right thalamic lacunar infarct and advanced scattered T2/FLAIR hyperintensities in the white matter, compatible with chronic microvascular ischemic disease. Vascular: Major arterial flow voids are maintained skull base. Skull and upper cervical spine: Normal marrow signal. Sinuses/Orbits: Negative. Other: None. IMPRESSION: 1. Expected evolution of left PCA infarcts without evidence of new/interval acute infarct, hemorrhage or progressive mass effect. 2. Similar advanced chronic microvascular ischemic disease. Electronically Signed   By: Gilmore GORMAN Molt M.D.   On: 05/24/2024 20:10   EEG adult Result Date: 05/24/2024 Katherine Arlin KIDD, MD     05/24/2024  5:38 PM Patient Name: [name] MRN: [id number] Epilepsy Attending: Arlin Park Katherine Referring Physician/Provider: Michaela Aisha SQUIBB, MD Date: 05/24/2024 Duration: 25.07 mins Patient history: 73yo F with ams. EEG to evaluate for seizure. Level of alertness: Awake AEDs during EEG study: None Technical aspects: This EEG study was done with scalp electrodes positioned according to the 10-20 International system of electrode placement. Electrical activity was reviewed  with band pass filter of 1-70Hz , sensitivity of 7 uV/mm, display speed of 47mm/sec with a 60Hz  notched filter applied as appropriate. EEG data were recorded continuously and digitally stored.  Video monitoring was available and reviewed as appropriate. Description: The posterior dominant rhythm consists of 7.5 Hz activity of moderate voltage (25-35 uV) seen predominantly in posterior head regions, symmetric and reactive to eye opening and eye closing. EEG showed continuous generalized predominantly 5 to 6 Hz theta slowing admixed with intermittent 2-3hz  delta slowing. Hyperventilation and photic stimulation were not performed.    ABNORMALITY - Continuous slow, generalized  IMPRESSION: This study is suggestive of mild to moderate diffuse encephalopathy. No seizures or epileptiform discharges were seen throughout the  recording.  Priyanka O Yadav   CT Head Wo Contrast Result Date: 05/20/2024 CLINICAL DATA:  Mental status change, unknown cause EXAM: CT HEAD WITHOUT CONTRAST TECHNIQUE: Contiguous axial images were obtained from the base of the skull through the vertex without intravenous contrast. RADIATION DOSE REDUCTION: This exam was performed according to the departmental dose-optimization program which includes automated exposure control, adjustment of the mA and/or kV according to patient size and/or use of iterative reconstruction technique. COMPARISON:  MRI head April 23, 2024. FINDINGS: Brain: No evidence of acute infarction, hemorrhage, hydrocephalus, extra-axial collection or mass lesion/mass effect. Evolving left PCA territory infarct. Advanced white matter hypodensities, better characterized on recent MRI. Vascular: No hyperdense vessel. Skull: Normal. Negative for fracture or focal lesion. Sinuses/Orbits: Clear sinuses.  No acute findings. IMPRESSION: 1. Evolving left PCA territory infarct without evidence of new/interval acute abnormality. 2. Advanced white matter disease, further evaluated on recent MRI.  Electronically Signed   By: Gilmore GORMAN Molt M.D.   On: 05/20/2024 20:23   DG Chest Port 1 View Result Date: 05/20/2024 CLINICAL DATA:  confusion EXAM: PORTABLE CHEST - 1 VIEW COMPARISON:  Mar 31, 2024 FINDINGS: Low lung volumes. No focal airspace consolidation, pleural effusion, or pneumothorax. Moderate cardiomegaly. Tortuous aorta with aortic atherosclerosis. No acute fracture or destructive lesions. Multilevel thoracic osteophytosis. Electronic structure overlying the midline chest. IMPRESSION: 1. Moderate cardiomegaly.  No pneumonia or pulmonary edema. 2. Electronic structure overlying the midline chest, likely external to the patient. Correlation with physical exam findings recommended. Electronically Signed   By: Rogelia Myers M.D.   On: 05/20/2024 17:32    Microbiology: Results for orders placed or performed during the hospital encounter of 05/20/24  MRSA Next Gen by PCR, Nasal     Status: None   Collection Time: 05/21/24  1:27 PM   Specimen: Nasal Mucosa; Nasal Swab  Result Value Ref Range Status   MRSA by PCR Next Gen NOT DETECTED NOT DETECTED Final    Comment: (NOTE) The GeneXpert MRSA Assay (FDA approved for NASAL specimens only), is one component of a comprehensive MRSA colonization surveillance program. It is not intended to diagnose MRSA infection nor to guide or monitor treatment for MRSA infections. Test performance is not FDA approved in patients less than 8 years old. Performed at [facility], [address] [place], [place] [address]     Labs: CBC: Recent Labs  Lab 05/25/24 [phone] 05/25/24 9081 05/25/24 1706 05/26/24 0326 05/27/24 0820 05/28/24 0251 05/29/24 0323  WBC 3.0*  --   --  3.2* 3.0* 3.4* 3.3*  HGB 6.5*   < > 9.5* 10.0* 10.4* 10.3* 9.4*  HCT 21.4*   < > 29.6* 31.2* 31.8* 32.2* 29.2*  MCV 104.9*  --   --  98.7 97.2 97.9 97.7  PLT 100*  --   --  125* 145* 138* 132*   < > = values in this interval not displayed.   Basic  Metabolic Panel: Recent Labs  Lab 05/24/24 0318 05/25/24 0319 05/26/24 0327 05/28/24 0251 05/29/24 0323  NA 142 139 137 137 136  K 3.9 3.6 3.2* 4.2 4.2  CL 115* 116* 108 109 109  CO2 20* 21* 21* 22 20*  GLUCOSE 84 140* 84 74 77  BUN 19 17 15 17 15   CREATININE 0.92 0.74 0.87 0.85 0.89  CALCIUM  8.3* 7.8* 8.3* 8.6* 8.4*   Liver Function Tests: Recent Labs  Lab 05/24/24 0318 05/25/24 0319 05/26/24 0327 05/28/24 0251 05/29/24 0323  AST 48* 36  42* 46* 42*  ALT 32 25 28 27 26   ALKPHOS 123 90 117 120 103  BILITOT 1.3* 0.8 1.2 1.2 1.2  PROT 5.5* 4.7* 5.1* 5.4* 5.1*  ALBUMIN  2.3* 1.8* 2.1* 2.6* 2.6*   CBG: Recent Labs  Lab 05/22/24 2126 05/24/24 1133 05/27/24 1700  GLUCAP 135* 82 103*    Discharge time spent: greater than 40 minutes.  Signed: Adriana DELENA Grams, MD [facility] 05/29/2024

## 2024-05-30 ENCOUNTER — Ambulatory Visit: Admitting: Physical Therapy

## 2024-05-30 ENCOUNTER — Telehealth: Payer: Self-pay

## 2024-05-30 NOTE — Transitions of Care (Post Inpatient/ED Visit) (Signed)
 05/30/2024  Name: [name] MRN: [id number] DOB: 1951-04-21  Today's TOC FU Call Status: Today's TOC FU Call Status:: Successful TOC FU Call Completed TOC FU Call Complete Date: 05/30/24 Patient's Name and Date of Birth confirmed.  Transition Care Management Follow-up Telephone Call Date of Discharge: 05/29/24 Discharge Facility: [facility]) Type of Discharge: Inpatient Admission Primary Inpatient Discharge Diagnosis:: encephalopathy How have you been since you were released from the hospital?: Better Any questions or concerns?: No  Items Reviewed: Did you receive and understand the discharge instructions provided?: Yes Medications obtained,verified, and reconciled?: Yes (Medications Reviewed) Any new allergies since your discharge?: No Dietary orders reviewed?: Yes Do you have support at home?: Yes People in Home [RPT]: spouse  Medications Reviewed Today: Medications Reviewed Today     Reviewed by Emmitt Pan, LPN (Licensed Practical Nurse) on 05/30/24 at 1237  Med List Status: <None>   Medication Order Taking? Sig Documenting Provider Last Dose Status Informant  Accu-Chek Softclix Lancets lancets [id number] Yes 3 (three) times daily. [provider]  Active Spouse/Significant Other, Pharmacy Records  aspirin  EC 81 MG tablet [id number] Yes Take 1 tablet (81 mg total) by mouth daily. Swallow whole.  Patient taking differently: Take 81 mg by mouth in the morning. Swallow whole.   Bryn Bernardino NOVAK, MD  Active Spouse/Significant Other, Pharmacy Records           Med Note EFRAIM ALFREIDA CROME   Dju May 20, 2024  4:43 PM) Patient's husband stated she may have thrown this medication up. Not sure.  Blood Glucose Monitoring Suppl DEVI [id number] Yes 1 each by Does not apply route in the morning, at noon, and at bedtime. May substitute to any manufacturer covered by patient's insurance. Rojelio Nest, DO  Active Spouse/Significant Other, Pharmacy Records  CRANBERRY  PO [id number] Yes Take 1 tablet by mouth 2 (two) times daily. [provider]  Active Spouse/Significant Other, Pharmacy Records           Med Note EFRAIM ALFREIDA CROME   Dju May 20, 2024  4:43 PM) Patient's husband stated she may have thrown this medication up. Not sure.  feeding supplement (ENSURE PLUS HIGH PROTEIN) LIQD [id number] Yes Take 237 mLs by mouth 2 (two) times daily between meals. Willette Adriana LABOR, MD  Active   ferrous sulfate  325 (65 FE) MG tablet [id number] Yes Take 1 tablet (325 mg total) by mouth 2 (two) times daily with a meal. Shahmehdi, Seyed A, MD  Active   furosemide  (LASIX ) 20 MG tablet [id number] Yes Take 20-40 mg by mouth See admin instructions. Take blood pressure and depending on reading take 20mg  or 40mg  by mouth daily. [provider]  Active Spouse/Significant Other, Pharmacy Records           Med Note EFRAIM ALFREIDA CROME   Dju May 20, 2024  4:44 PM) Patient's husband stated she may have thrown this medication up. Not sure. Patient took 20mg .  lactulose  (CHRONULAC ) 10 GM/15ML solution [id number]  Take 45 mLs (30 g total) by mouth 3 (three) times daily.  Patient not taking: Reported on 05/30/2024   Pokhrel, Laxman, MD  Active Spouse/Significant Other, Pharmacy Records  levOCARNitine  (CARNITOR ) 330 MG tablet [id number] Yes Take 330 mg by mouth 3 (three) times daily. [provider]  Active Spouse/Significant Other, Pharmacy Records           Med Note EFRAIM ALFREIDA CROME   Dju May 20, 2024  4:44 PM) Patient's husband stated she may have  thrown this medication up. Not sure.  levothyroxine  (SYNTHROID ) 50 MCG tablet [id number] Yes Take 1 tablet (50 mcg total) by mouth daily at 6 (six) AM. Shahmehdi, Seyed A, MD  Active   liver oil-zinc  oxide (DESITIN) 40 % ointment [id number] Yes Apply topically daily as needed for irritation. Pokhrel, Laxman, MD  Active Spouse/Significant Other, Pharmacy Records  magnesium  oxide (MAG-OX) 400 MG tablet [id number] Yes  Take 1 tablet (400 mg total) by mouth daily.  Patient taking differently: Take 400 mg by mouth in the morning.   Rojelio Nest, DO  Active Spouse/Significant Other, Pharmacy Records           Med Note EFRAIM ALFREIDA CROME   Dju May 20, 2024  4:45 PM) Patient's husband stated she may have thrown this medication up. Not sure.  methylPREDNISolone  (MEDROL  DOSEPAK) 4 MG TBPK tablet [id number] Yes Medrol  Dosepak: take as instructed on package Willette Adriana LABOR, MD  Active   midodrine  (PROAMATINE ) 10 MG tablet [id number] Yes Take 1 tablet (10 mg total) by mouth 3 (three) times daily with meals. Willette Adriana LABOR, MD  Active   Multiple Vitamin (MULTI-VITAMIN) tablet [id number] Yes Take 1 tablet by mouth in the morning. [provider]  Active Spouse/Significant Other, Pharmacy Records           Med Note EFRAIM ALFREIDA CROME   Dju May 20, 2024  4:45 PM) Patient's husband stated she may have thrown this medication up. Not sure.  pantoprazole  (PROTONIX ) 40 MG tablet [id number] Yes Take 1 tablet (40 mg total) by mouth 2 (two) times daily. Willette Adriana LABOR, MD  Active   rifaximin  (XIFAXAN ) 550 MG TABS tablet [id number] Yes Take 1 tablet (550 mg total) by mouth 2 (two) times daily.  Patient taking differently: Take 550 mg by mouth as needed. Flareups   Bryn Bernardino NOVAK, MD  Active Spouse/Significant Other, Pharmacy Records           Med Note EFRAIM ALFREIDA CROME   Dju May 20, 2024  4:50 PM) Patient's husband stated this medication does not help her. Also medication cost around a ton.            Home Care and Equipment/Supplies: Were Home Health Services Ordered?: Yes Name of Home Health Agency:: unknown Has Agency set up a time to come to your home?: No EMR reviewed for Home Health Orders: Orders present/patient has not received call (refer to CM for follow-up) Any new equipment or medical supplies ordered?: NA  Functional Questionnaire: Do you need assistance with bathing/showering or  dressing?: No Do you need assistance with meal preparation?: No Do you need assistance with eating?: No Do you have difficulty maintaining continence: No Do you need assistance with getting out of bed/getting out of a chair/moving?: No Do you have difficulty managing or taking your medications?: No  Follow up appointments reviewed: PCP Follow-up appointment confirmed?: Yes Date of PCP follow-up appointment?: 06/08/24 Follow-up Provider: [facility] Follow-up appointment confirmed?: Yes Date of Specialist follow-up appointment?: 06/08/24 Follow-Up Specialty Provider:: endo Do you need transportation to your follow-up appointment?: No Do you understand care options if your condition(s) worsen?: Yes-patient verbalized understanding    SIGNATURE Julian Lemmings, LPN [address] Nurse Health Advisor Direct Dial [phone]

## 2024-05-31 DIAGNOSIS — K766 Portal hypertension: Secondary | ICD-10-CM | POA: Diagnosis not present

## 2024-05-31 DIAGNOSIS — Z7982 Long term (current) use of aspirin: Secondary | ICD-10-CM | POA: Diagnosis not present

## 2024-05-31 DIAGNOSIS — E2740 Unspecified adrenocortical insufficiency: Secondary | ICD-10-CM | POA: Diagnosis not present

## 2024-05-31 DIAGNOSIS — G9341 Metabolic encephalopathy: Secondary | ICD-10-CM | POA: Diagnosis not present

## 2024-05-31 DIAGNOSIS — K8020 Calculus of gallbladder without cholecystitis without obstruction: Secondary | ICD-10-CM | POA: Diagnosis not present

## 2024-05-31 DIAGNOSIS — Z8673 Personal history of transient ischemic attack (TIA), and cerebral infarction without residual deficits: Secondary | ICD-10-CM | POA: Diagnosis not present

## 2024-05-31 DIAGNOSIS — I8510 Secondary esophageal varices without bleeding: Secondary | ICD-10-CM | POA: Diagnosis not present

## 2024-05-31 DIAGNOSIS — Z7952 Long term (current) use of systemic steroids: Secondary | ICD-10-CM | POA: Diagnosis not present

## 2024-05-31 DIAGNOSIS — N179 Acute kidney failure, unspecified: Secondary | ICD-10-CM | POA: Diagnosis not present

## 2024-05-31 DIAGNOSIS — K7460 Unspecified cirrhosis of liver: Secondary | ICD-10-CM | POA: Diagnosis not present

## 2024-05-31 DIAGNOSIS — D509 Iron deficiency anemia, unspecified: Secondary | ICD-10-CM | POA: Diagnosis not present

## 2024-05-31 DIAGNOSIS — K7682 Hepatic encephalopathy: Secondary | ICD-10-CM | POA: Diagnosis not present

## 2024-05-31 DIAGNOSIS — I119 Hypertensive heart disease without heart failure: Secondary | ICD-10-CM | POA: Diagnosis not present

## 2024-05-31 DIAGNOSIS — Z9181 History of falling: Secondary | ICD-10-CM | POA: Diagnosis not present

## 2024-05-31 DIAGNOSIS — Z792 Long term (current) use of antibiotics: Secondary | ICD-10-CM | POA: Diagnosis not present

## 2024-05-31 DIAGNOSIS — M170 Bilateral primary osteoarthritis of knee: Secondary | ICD-10-CM | POA: Diagnosis not present

## 2024-05-31 DIAGNOSIS — E039 Hypothyroidism, unspecified: Secondary | ICD-10-CM | POA: Diagnosis not present

## 2024-05-31 DIAGNOSIS — Z85828 Personal history of other malignant neoplasm of skin: Secondary | ICD-10-CM | POA: Diagnosis not present

## 2024-05-31 DIAGNOSIS — Z85038 Personal history of other malignant neoplasm of large intestine: Secondary | ICD-10-CM | POA: Diagnosis not present

## 2024-05-31 DIAGNOSIS — I480 Paroxysmal atrial fibrillation: Secondary | ICD-10-CM | POA: Diagnosis not present

## 2024-05-31 LAB — T3, FREE: T3, Free: 1.5 pg/mL — ABNORMAL LOW (ref 2.0–4.4)

## 2024-06-01 LAB — SURGICAL PATHOLOGY

## 2024-06-02 ENCOUNTER — Ambulatory Visit: Attending: Internal Medicine | Admitting: Internal Medicine

## 2024-06-02 ENCOUNTER — Ambulatory Visit

## 2024-06-02 ENCOUNTER — Ambulatory Visit (INDEPENDENT_AMBULATORY_CARE_PROVIDER_SITE_OTHER)

## 2024-06-02 ENCOUNTER — Telehealth: Payer: Self-pay

## 2024-06-02 VITALS — BP 116/70 | HR 59 | Ht 63.0 in | Wt 159.0 lb

## 2024-06-02 DIAGNOSIS — I480 Paroxysmal atrial fibrillation: Secondary | ICD-10-CM

## 2024-06-02 DIAGNOSIS — K766 Portal hypertension: Secondary | ICD-10-CM

## 2024-06-02 DIAGNOSIS — K7460 Unspecified cirrhosis of liver: Secondary | ICD-10-CM | POA: Diagnosis not present

## 2024-06-02 DIAGNOSIS — R42 Dizziness and giddiness: Secondary | ICD-10-CM

## 2024-06-02 DIAGNOSIS — I495 Sick sinus syndrome: Secondary | ICD-10-CM | POA: Diagnosis not present

## 2024-06-02 DIAGNOSIS — I639 Cerebral infarction, unspecified: Secondary | ICD-10-CM

## 2024-06-02 IMAGING — CT CT HEAD W/O CM
3 of 4 series · 14 of 47 positions shown, 16 images · non-contrast
Comparison: 11/27/2020

CLINICAL DATA: Weakness, nausea, confusion



[Series 5: coronal soft tissue · coronal · 0.31mm/px · 3 of 65 slices shown]
[im 22/65  brain]
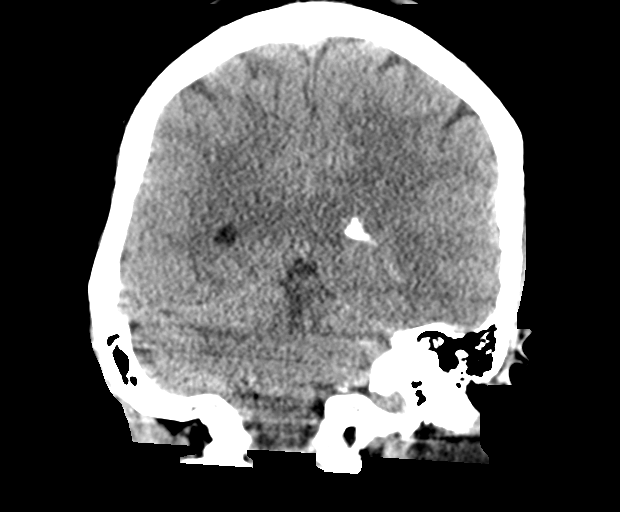
[im 29/65  brain]
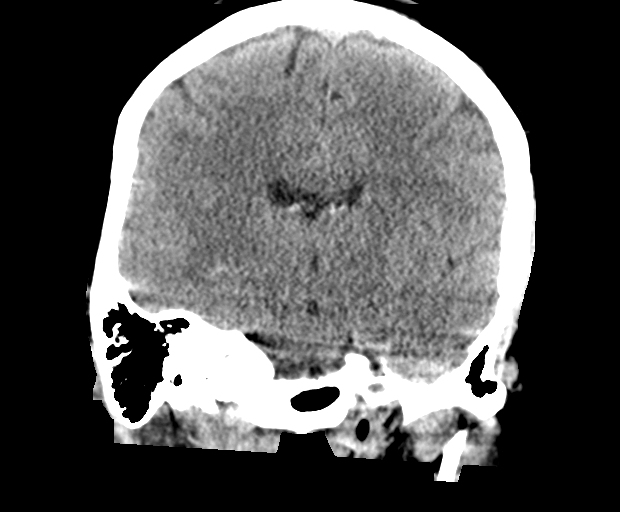
[im 36/65  brain]
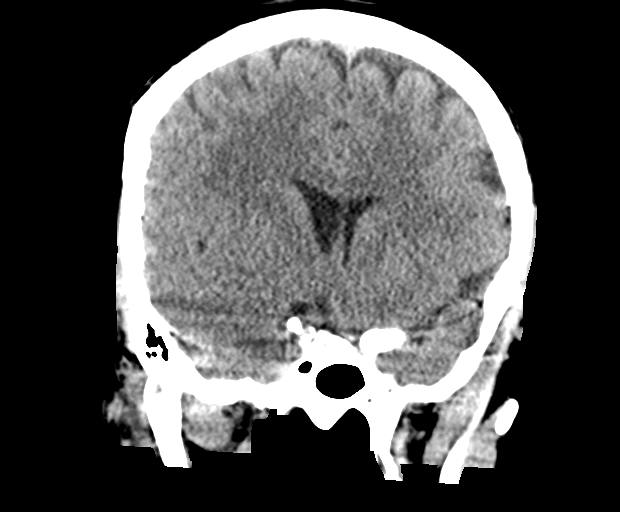

[Series 6: sagittal soft tissue · sagittal · 0.30mm/px · 3 of 61 slices shown]
[im 21/61  brain]
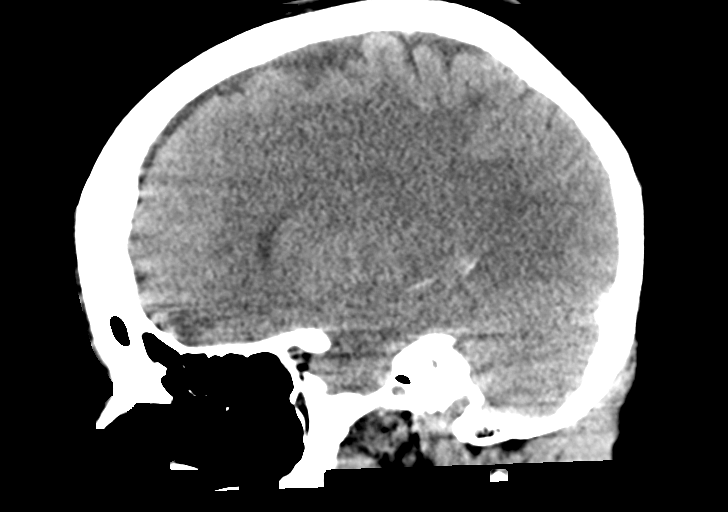
[im 31/61  brain]
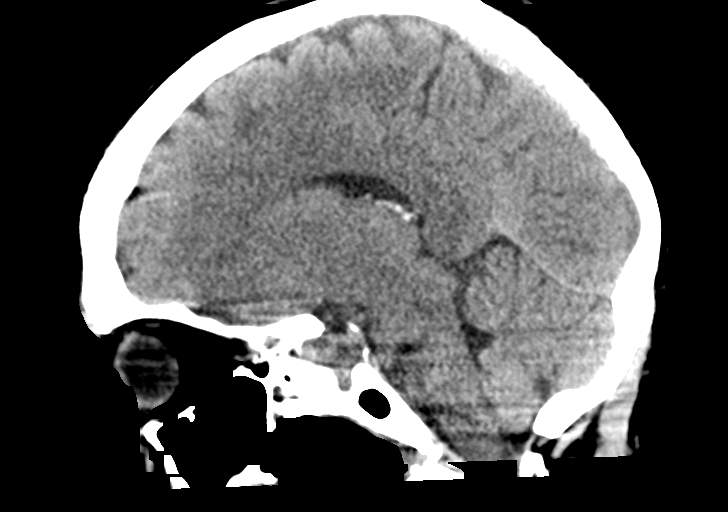
[im 41/61  brain]
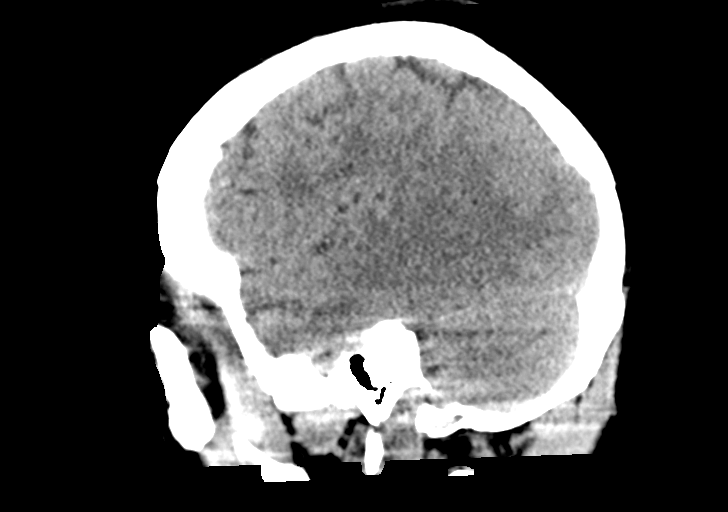

[Series 602: <mpr thick range> · axial · 0.52mm/px · z∈[-79,+34]mm · 8 of 76 slices shown, 10 images]
[im 8/76  brain]
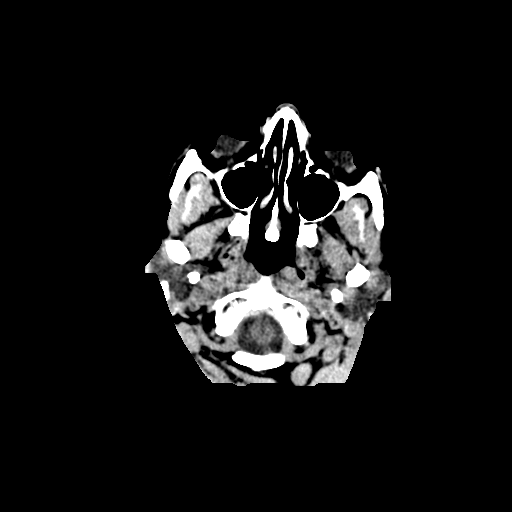
[im 8/76  bone]
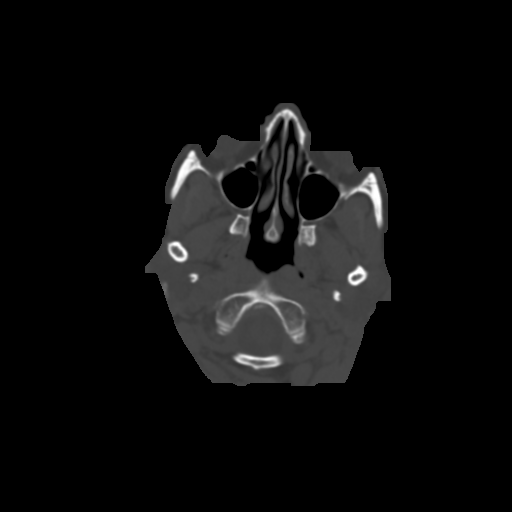
[im 16/76  brain]
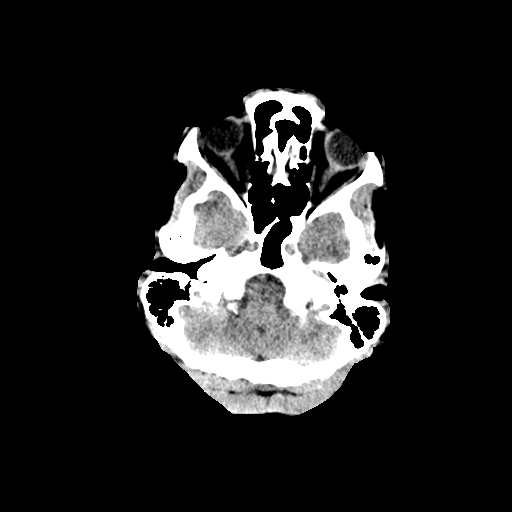
[im 23/76  brain]
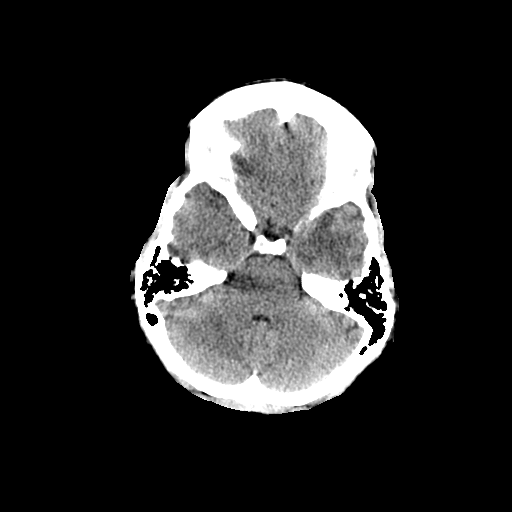
[im 34/76  brain]
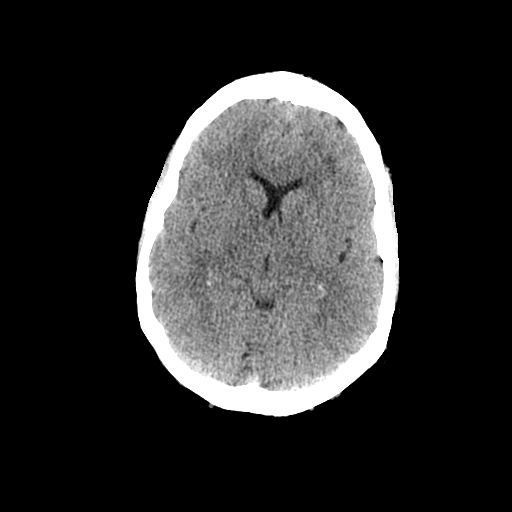
[im 42/76  brain]
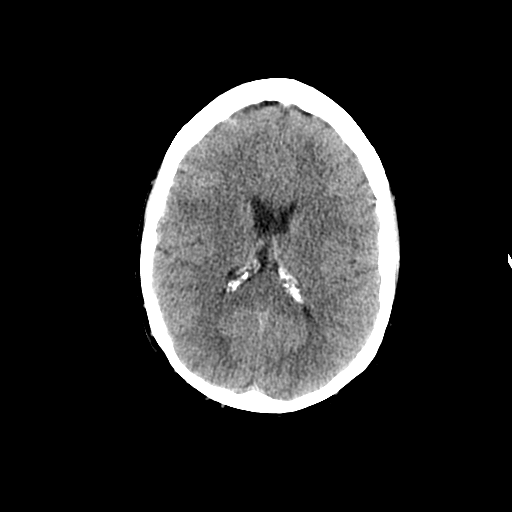
[im 42/76  bone]
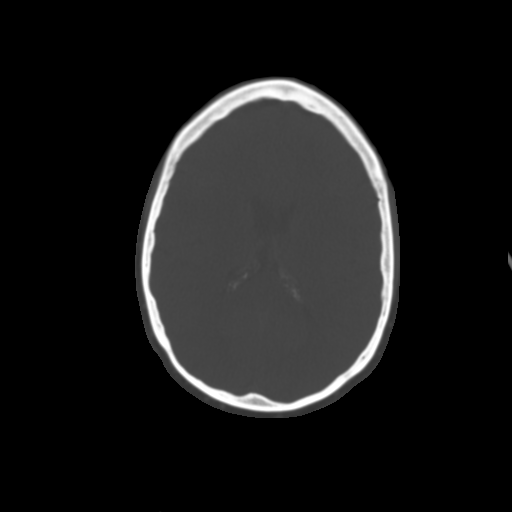
[im 53/76  brain]
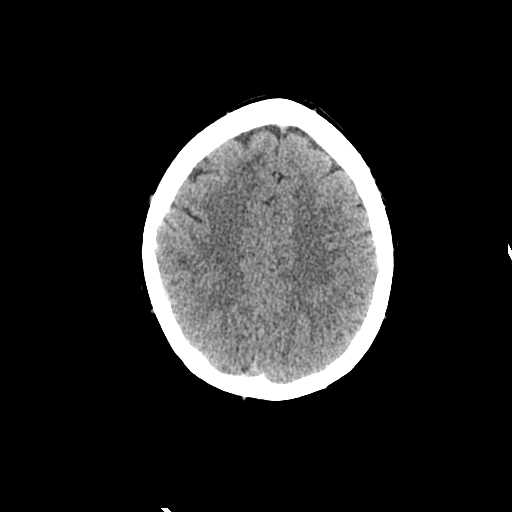
[im 61/76  brain]
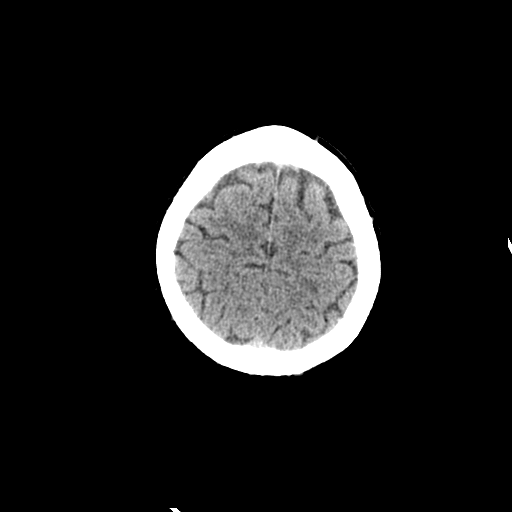
[im 68/76  brain]
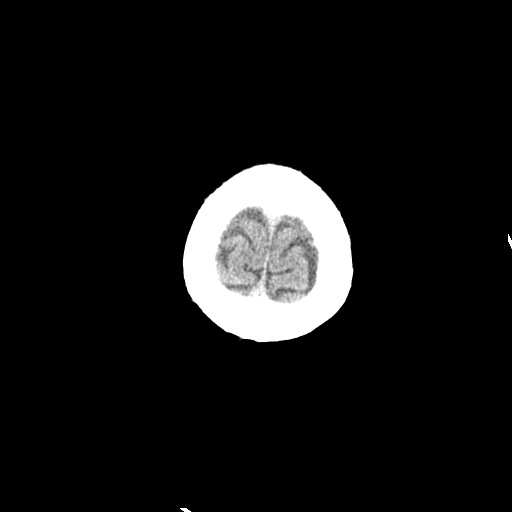

[14 of 47 positions shown; findings below may reference images not displayed]

FINDINGS: Brain: Subtle hypodensity in the right frontal lobe (series 2, image
19) which is new from the prior exam. No evidence of acute
infarction, hemorrhage, cerebral edema, mass, mass effect, or
midline shift. No hydrocephalus or extra-axial fluid collection.

Vascular: No hyperdense vessel.

Skull: Normal. Negative for fracture or focal lesion.

Sinuses/Orbits: No acute finding.

Other: The mastoid air cells are well aerated.
IMPRESSION: Subtle hypodensity in the right frontal lobe, new from the prior
exam, which is technically age indeterminate but could represent
acute or subacute infarct. An MRI is recommended for further
evaluation.

These results were called by telephone at the time of interpretation
on 04/30/2022 at [DATE] to provider BAMBUCAFE TARLA , who verbally
acknowledged these results.

## 2024-06-02 IMAGING — DX DG CHEST 1V PORT
1 series · 1 of 1 positions shown · non-contrast
Comparison: 02/02/2022

CLINICAL DATA: Sepsis.

EXAM:
PORTABLE CHEST 1 VIEW

[chest ap]
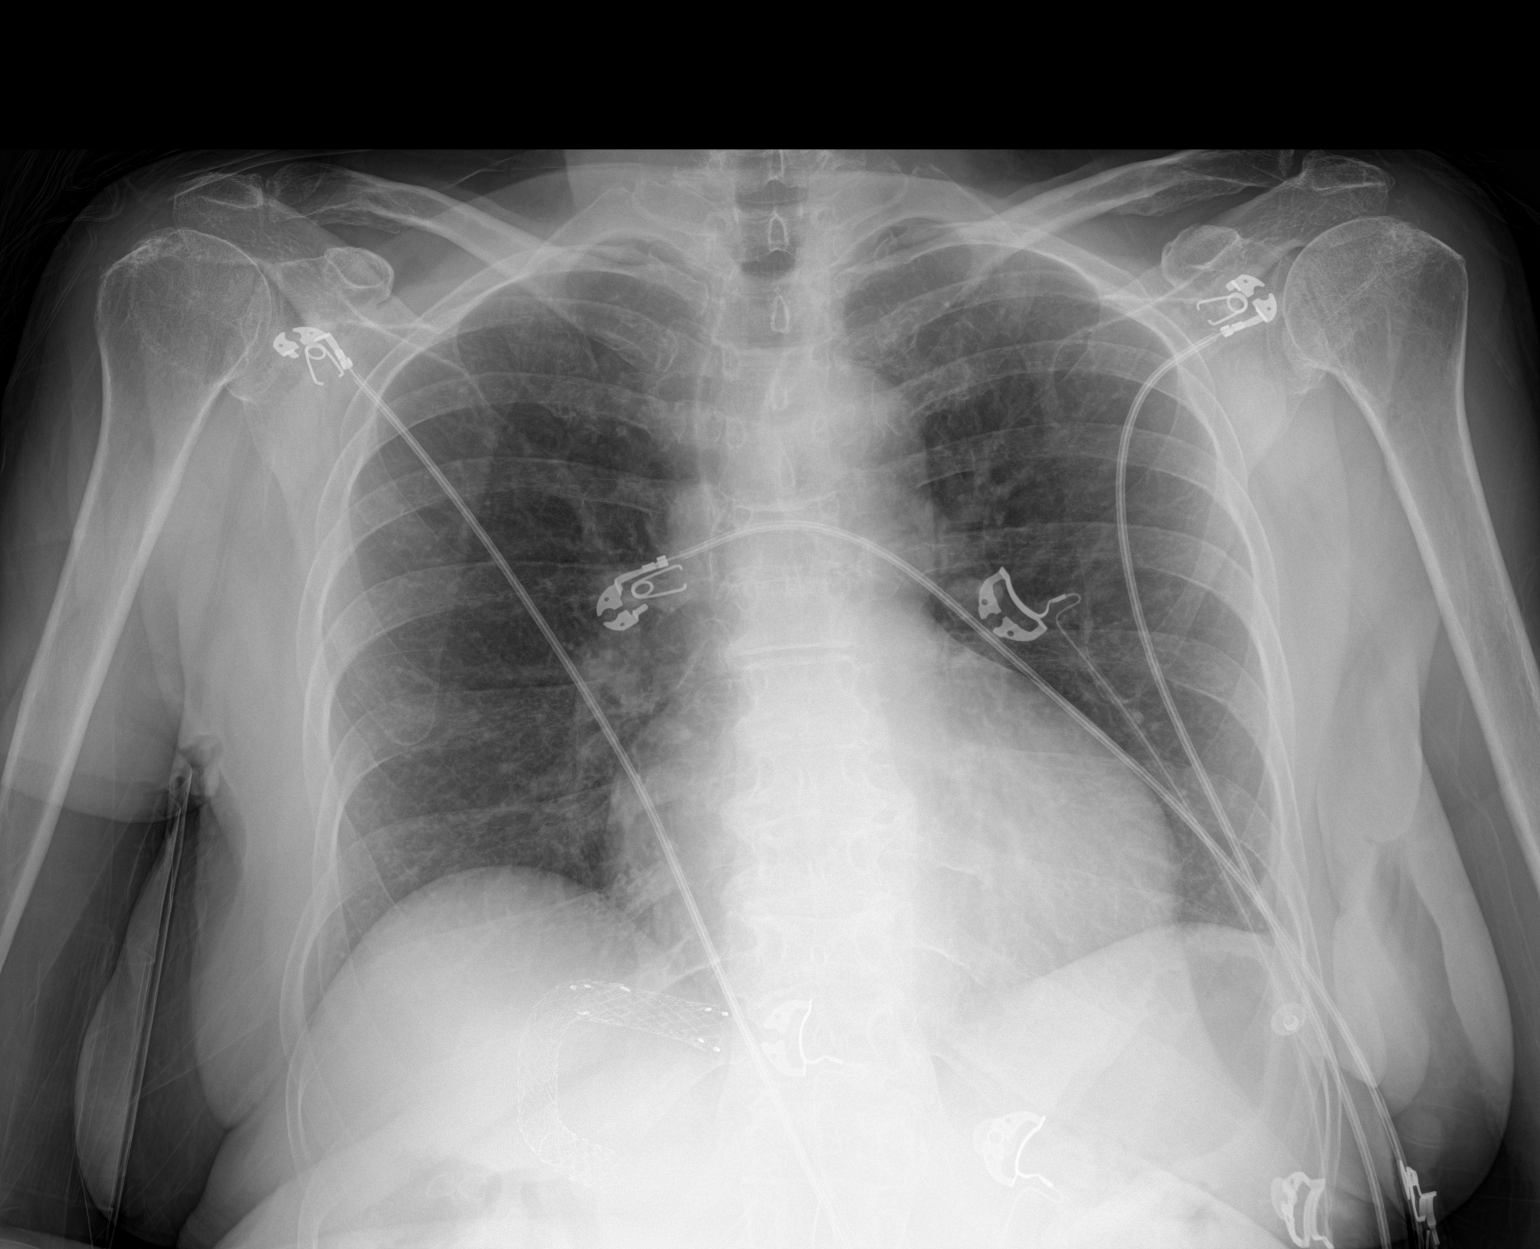

[1 of 1 positions shown; findings below may reference images not displayed]

FINDINGS: 6306 hours. The cardio pericardial silhouette is enlarged. The lungs
are clear without focal pneumonia, edema, pneumothorax or pleural
effusion. Bones are diffusely demineralized. Telemetry leads overlie
the chest.
IMPRESSION: Low volume film without acute cardiopulmonary findings.

## 2024-06-02 NOTE — Progress Notes (Signed)
 Cardiology Office Note:  .    Date:  06/02/2024  ID:  [name], DOB 19-Aug-1951, MRN [id number] PCP: [name], NP  [facility] Providers Cardiologist:  Stanly DELENA Leavens, MD     CC: Follow up  History of Present Illness: Katherine    Dashea Park is a 73 y.o. female with paroxysmal atrial fibrillation who presents for cardiovascular evaluation.  She has a history of paroxysmal atrial fibrillation, which is a new rhythm issue compared to her previous conditions of supraventricular tachycardia and tachy-brady syndrome. She is currently on Plavix , managed by neurology due to her history of stroke, and aspirin  for stroke prevention. She is not on any medication specifically to suppress her cardiac rhythms.  She has a history of colon adenocarcinoma and is currently on oxaliplatin. She also has cirrhosis and has experienced multiple hospitalizations this year, totaling five visits in seven months. Each hospitalization takes a while to recover from, and she has not yet returned to her baseline health status. She has experienced significant anemia and has a history of gastric ulcers. She was hospitalized for a gastrointestinal bleed. Her ammonia levels have been high, which may have contributed to her cardiac rhythm issues.  Her social history includes active involvement in self-care and helping in the kitchen. She feels tired and sometimes fatigued, but no specific new symptoms were elicited during the review of systems.  Discussed the use of AI scribe software for clinical note transcription with the patient, who gave verbal consent to proceed.  Relevant histories: .  Social  - Married comes with hospital ROS: As per HPI.   Studies Reviewed: .     Cardiac Studies & Procedures   ______________________________________________________________________________________________     ECHOCARDIOGRAM  ECHOCARDIOGRAM COMPLETE 04/16/2024  Narrative ECHOCARDIOGRAM  REPORT    Patient Name:   [name] Date of Exam: 04/16/2024 Medical Rec #:  [id number]       Height:       64.0 in Accession #:    [id number]      Weight:       145.0 lb Date of Birth:  Feb 17, 1951        BSA:          1.706 m Patient Age:    72 years        BP:           91/49 mmHg Patient Gender: F               HR:           74 bpm. Exam Location:  Inpatient  Procedure: 2D Echo, Color Doppler and Cardiac Doppler (Both Spectral and Color Flow Doppler were utilized during procedure).  Indications:    Stroke i63.9  History:        Patient has prior history of Echocardiogram examinations, most recent 12/23/2023. Portal Hypertension; Risk Factors:Hypertension. S/p TIPS procedure.  Sonographer:    Damien Senior RDCS Referring Phys: EARLE BRAVO DE LA TORRE  IMPRESSIONS   1. Left ventricular ejection fraction, by estimation, is 70 to 75%. The left ventricle has hyperdynamic function. The left ventricle has no regional wall motion abnormalities. Left ventricular diastolic parameters are indeterminate. 2. Right ventricular systolic function is normal. The right ventricular size is mildly enlarged. There is moderately elevated pulmonary artery systolic pressure. The estimated right ventricular systolic pressure is 45.7 mmHg. 3. Left atrial size was mildly dilated. 4. The mitral valve is degenerative. Mild mitral valve regurgitation. The  mean mitral valve gradient is 8.5 mmHg with average heart rate of 77 bpm, increased flow velocities secondary to high flow state, challenging to interpret degree of stenosis. Severe mitral annular calcification. 5. The aortic valve is tricuspid. Aortic valve regurgitation is not visualized. No aortic stenosis is present. 6. The inferior vena cava is dilated in size with <50% respiratory variability, suggesting right atrial pressure of 15 mmHg. 7. Increased flow velocities may be secondary to anemia, thyrotoxicosis, hyperdynamic or high flow state - suspect  high flow from TIPS shunt physiology.  FINDINGS Left Ventricle: Left ventricular ejection fraction, by estimation, is 70 to 75%. The left ventricle has hyperdynamic function. The left ventricle has no regional wall motion abnormalities. The left ventricular internal cavity size was normal in size. There is no left ventricular hypertrophy. Left ventricular diastolic parameters are indeterminate.  Right Ventricle: The right ventricular size is mildly enlarged. No increase in right ventricular wall thickness. Right ventricular systolic function is normal. There is moderately elevated pulmonary artery systolic pressure. The tricuspid regurgitant velocity is 2.77 m/s, and with an assumed right atrial pressure of 15 mmHg, the estimated right ventricular systolic pressure is 45.7 mmHg.  Left Atrium: Left atrial size was mildly dilated.  Right Atrium: Right atrial size was normal in size.  Pericardium: There is no evidence of pericardial effusion.  Mitral Valve: The mitral valve is degenerative in appearance. Severe mitral annular calcification. Mild mitral valve regurgitation. MV peak gradient, 16.2 mmHg. The mean mitral valve gradient is 8.5 mmHg with average heart rate of 77 bpm.  Tricuspid Valve: The tricuspid valve is normal in structure. Tricuspid valve regurgitation is mild.  Aortic Valve: The aortic valve is tricuspid. Aortic valve regurgitation is not visualized. No aortic stenosis is present. Aortic valve mean gradient measures 9.0 mmHg. Aortic valve peak gradient measures 16.1 mmHg. Aortic valve area, by VTI measures 2.00 cm.  Pulmonic Valve: The pulmonic valve was normal in structure. Pulmonic valve regurgitation is mild.  Aorta: The aortic root and ascending aorta are structurally normal, with no evidence of dilitation.  Venous: The inferior vena cava is dilated in size with less than 50% respiratory variability, suggesting right atrial pressure of 15 mmHg.  IAS/Shunts: The atrial  septum is grossly normal.   LEFT VENTRICLE PLAX 2D LVIDd:         5.10 cm   Diastology LVIDs:         2.80 cm   LV e' medial:    7.29 cm/s LV PW:         0.80 cm   LV E/e' medial:  17.8 LV IVS:        0.70 cm   LV e' lateral:   8.49 cm/s LVOT diam:     1.80 cm   LV E/e' lateral: 15.3 LV SV:         87 LV SV Index:   51 LVOT Area:     2.54 cm   RIGHT VENTRICLE RV S prime:     12.30 cm/s TAPSE (M-mode): 2.3 cm  LEFT ATRIUM             Index        RIGHT ATRIUM           Index LA diam:        3.10 cm 1.82 cm/m   RA Area:     14.80 cm LA Vol (A2C):   53.0 ml 31.06 ml/m  RA Volume:   30.40 ml  17.81 ml/m  LA Vol (A4C):   55.0 ml 32.23 ml/m LA Biplane Vol: 53.8 ml 31.53 ml/m AORTIC VALVE AV Area (Vmax):    1.87 cm AV Area (Vmean):   2.01 cm AV Area (VTI):     2.00 cm AV Vmax:           200.33 cm/s AV Vmean:          143.000 cm/s AV VTI:            0.436 m AV Peak Grad:      16.1 mmHg AV Mean Grad:      9.0 mmHg LVOT Vmax:         147.50 cm/s LVOT Vmean:        113.000 cm/s LVOT VTI:          0.342 m LVOT/AV VTI ratio: 0.79  AORTA Ao Root diam: 2.70 cm Ao Asc diam:  2.90 cm  MITRAL VALVE                TRICUSPID VALVE MV Area (PHT): 2.11 cm     TR Peak grad:   30.7 mmHg MV Area VTI:   1.78 cm     TR Vmax:        277.00 cm/s MV Peak grad:  16.2 mmHg MV Mean grad:  8.5 mmHg     SHUNTS MV Vmax:       2.01 m/s     Systemic VTI:  0.34 m MV Vmean:      138.5 cm/s   Systemic Diam: 1.80 cm MV Decel Time: 360 msec MV E velocity: 130.00 cm/s MV A velocity: 166.00 cm/s MV E/A ratio:  0.78  Soyla Merck MD Electronically signed by Soyla Merck MD Signature Date/Time: 04/16/2024/9:50:23 AM    Final    MONITORS  CARDIAC EVENT MONITOR 05/20/2024       ______________________________________________________________________________________________        Physical Exam:    VS:  BP 116/70 (BP Location: Left Arm)   Pulse (!) 59   Ht 5' 3 (1.6 m)    Wt 159 lb (72.1 kg)   SpO2 97%   BMI 28.17 kg/m    Wt Readings from Last 3 Encounters:  06/02/24 159 lb (72.1 kg)  05/29/24 156 lb 4.9 oz (70.9 kg)  05/11/24 153 lb 2 oz (69.5 kg)    Gen: no distress  Cardiac: No Rubs or Gallops, systolis murmur, regular bradycardia, +2 radial pulses Respiratory: Clear to auscultation bilaterally, normal effort, normal  respiratory rate GI: Soft, nontender, non-distended  MS: No  edema;  moves all extremities Integument: Skin feels warm Neuro:  At time of evaluation, alert and oriented to person/place/time/situation  Psych: Normal affect, patient feels ok   ASSESSMENT AND PLAN: .    Paroxysmal atrial fibrillation - Paroxysmal atrial fibrillation is a relatively new diagnosis for her, increasing her risk of stroke. She is not a candidate for anticoagulation due to high risk of gastrointestinal bleeding from varices and significant anemia. The Watchman procedure is considered for stroke prevention but is currently not feasible due to the need for stable periods without hospitalizations and the ability to tolerate blood thinners, which poses a significant risk for her. - Continue current management without anticoagulation - Reassess candidacy for Watchman procedure if stable without hospitalizations and cleared by GI  Stroke Stroke increases her risk for future cerebrovascular events. Current management is limited by her bleeding risk, and she is maintained on aspirin  for stroke prevention. - Continue aspirin  for stroke  prevention; if bleeding issues will need to decrease to no therapy; discussed risk and benefits of PFO closure vs LAA-O; suspect AF is high risk phenotype and would favor this if cleared over [facility]  Cirrhosis Cirrhosis contributes to her overall health status and complicates management of her cardiac conditions. It is associated with high ammonia levels, which may affect heart rhythms.  Sees Dr. Abran  Gastric ulcers Gastric ulcers  contribute to her risk of gastrointestinal bleeding, complicating the use of anticoagulation for atrial fibrillation.  Sees Dr. Abran.  Anemia Significant anemia, likely related to her gastric ulcers and cirrhosis, contributes to the complexity of her management.  Sees Dr. Abran  Patent foramen ovale (PFO) PFO is present but is not considered a significant contributor to her stroke risk compared to atrial fibrillation. Closure is not recommended at this time due to low likelihood of benefit.  Supraventricular tachycardia (SVT) SVT was previously managed with electrolyte correction and has improved. It is not currently symptomatic or requiring intervention. - given no symptoms and resting bradycardia no plans for AV nodal thearpy  Planned for Winter f/u with my team; if cleared for [facility], would refer to [facility]  Stanly Leavens, MD FASE [facility] Cardiologist [facility]  [address] [place], [address] [place], [place] [address] [phone]  1:14 PM

## 2024-06-02 NOTE — Telephone Encounter (Unsigned)
 Copied from CRM [id number]. Topic: Clinical - Home Health Verbal Orders >> Jun 02, 2024  3:53 PM Mia F wrote: Caller/Agency: [facility] Callback Number: [phone] Service Requested: Social Work Assessment  Frequency: A one time initial assessment Any new concerns about the patient? No  Services requested is per family request. Call from agency

## 2024-06-02 NOTE — Patient Instructions (Signed)
 Medication Instructions:  The current medical regimen is effective;  continue present plan and medications.  *If you need a refill on your cardiac medications before your next appointment, please call your pharmacy*  Follow-Up: At [facility], you and your health needs are our priority.  As part of our continuing mission to provide you with exceptional heart care, our providers are all part of one team.  This team includes your primary Cardiologist (physician) and Advanced Practice Providers or APPs (Physician Assistants and Nurse Practitioners) who all work together to provide you with the care you need, when you need it.  Your next appointment:   4 month(s)  Provider:   Jackee Alberts, NP          We recommend signing up for the patient portal called MyChart.  Sign up information is provided on this After Visit Summary.  MyChart is used to connect with patients for Virtual Visits (Telemedicine).  Patients are able to view lab/test results, encounter notes, upcoming appointments, etc.  Non-urgent messages can be sent to your provider as well.   To learn more about what you can do with MyChart, go to [email or web address].

## 2024-06-04 ENCOUNTER — Ambulatory Visit: Payer: Self-pay | Admitting: Internal Medicine

## 2024-06-04 DIAGNOSIS — I639 Cerebral infarction, unspecified: Secondary | ICD-10-CM

## 2024-06-04 DIAGNOSIS — R42 Dizziness and giddiness: Secondary | ICD-10-CM

## 2024-06-05 ENCOUNTER — Other Ambulatory Visit

## 2024-06-05 DIAGNOSIS — Z9181 History of falling: Secondary | ICD-10-CM | POA: Diagnosis not present

## 2024-06-05 DIAGNOSIS — Z85038 Personal history of other malignant neoplasm of large intestine: Secondary | ICD-10-CM | POA: Diagnosis not present

## 2024-06-05 DIAGNOSIS — G9341 Metabolic encephalopathy: Secondary | ICD-10-CM | POA: Diagnosis not present

## 2024-06-05 DIAGNOSIS — K8020 Calculus of gallbladder without cholecystitis without obstruction: Secondary | ICD-10-CM | POA: Diagnosis not present

## 2024-06-05 DIAGNOSIS — K766 Portal hypertension: Secondary | ICD-10-CM | POA: Diagnosis not present

## 2024-06-05 DIAGNOSIS — I480 Paroxysmal atrial fibrillation: Secondary | ICD-10-CM | POA: Diagnosis not present

## 2024-06-05 DIAGNOSIS — I119 Hypertensive heart disease without heart failure: Secondary | ICD-10-CM | POA: Diagnosis not present

## 2024-06-05 DIAGNOSIS — M170 Bilateral primary osteoarthritis of knee: Secondary | ICD-10-CM | POA: Diagnosis not present

## 2024-06-05 DIAGNOSIS — N179 Acute kidney failure, unspecified: Secondary | ICD-10-CM | POA: Diagnosis not present

## 2024-06-05 DIAGNOSIS — Z7952 Long term (current) use of systemic steroids: Secondary | ICD-10-CM | POA: Diagnosis not present

## 2024-06-05 DIAGNOSIS — Z7982 Long term (current) use of aspirin: Secondary | ICD-10-CM | POA: Diagnosis not present

## 2024-06-05 DIAGNOSIS — E039 Hypothyroidism, unspecified: Secondary | ICD-10-CM | POA: Diagnosis not present

## 2024-06-05 DIAGNOSIS — E2740 Unspecified adrenocortical insufficiency: Secondary | ICD-10-CM | POA: Diagnosis not present

## 2024-06-05 DIAGNOSIS — Z8673 Personal history of transient ischemic attack (TIA), and cerebral infarction without residual deficits: Secondary | ICD-10-CM | POA: Diagnosis not present

## 2024-06-05 DIAGNOSIS — K7460 Unspecified cirrhosis of liver: Secondary | ICD-10-CM | POA: Diagnosis not present

## 2024-06-05 DIAGNOSIS — K7682 Hepatic encephalopathy: Secondary | ICD-10-CM | POA: Diagnosis not present

## 2024-06-05 DIAGNOSIS — Z792 Long term (current) use of antibiotics: Secondary | ICD-10-CM | POA: Diagnosis not present

## 2024-06-05 DIAGNOSIS — I8510 Secondary esophageal varices without bleeding: Secondary | ICD-10-CM | POA: Diagnosis not present

## 2024-06-05 DIAGNOSIS — D509 Iron deficiency anemia, unspecified: Secondary | ICD-10-CM | POA: Diagnosis not present

## 2024-06-05 DIAGNOSIS — Z85828 Personal history of other malignant neoplasm of skin: Secondary | ICD-10-CM | POA: Diagnosis not present

## 2024-06-05 LAB — T4, FREE: Free T4: 1 ng/dL (ref 0.8–1.8)

## 2024-06-05 LAB — TSH: TSH: 26.38 m[IU]/L — ABNORMAL HIGH (ref 0.40–4.50)

## 2024-06-05 NOTE — Telephone Encounter (Signed)
Verbal orders approved.

## 2024-06-05 NOTE — Telephone Encounter (Signed)
 Contacted Child psychotherapist from [place] home health.  Approved verbal orders per PCP. The social worker verbalized understanding and has no questions or concerns.

## 2024-06-06 ENCOUNTER — Ambulatory Visit: Payer: Self-pay | Admitting: Internal Medicine

## 2024-06-06 ENCOUNTER — Ambulatory Visit: Payer: Self-pay

## 2024-06-06 ENCOUNTER — Ambulatory Visit: Admitting: Physical Therapy

## 2024-06-06 DIAGNOSIS — Z7952 Long term (current) use of systemic steroids: Secondary | ICD-10-CM | POA: Diagnosis not present

## 2024-06-06 DIAGNOSIS — M170 Bilateral primary osteoarthritis of knee: Secondary | ICD-10-CM | POA: Diagnosis not present

## 2024-06-06 DIAGNOSIS — E2740 Unspecified adrenocortical insufficiency: Secondary | ICD-10-CM | POA: Diagnosis not present

## 2024-06-06 DIAGNOSIS — G9341 Metabolic encephalopathy: Secondary | ICD-10-CM | POA: Diagnosis not present

## 2024-06-06 DIAGNOSIS — N179 Acute kidney failure, unspecified: Secondary | ICD-10-CM | POA: Diagnosis not present

## 2024-06-06 DIAGNOSIS — K766 Portal hypertension: Secondary | ICD-10-CM | POA: Diagnosis not present

## 2024-06-06 DIAGNOSIS — K7682 Hepatic encephalopathy: Secondary | ICD-10-CM | POA: Diagnosis not present

## 2024-06-06 DIAGNOSIS — E039 Hypothyroidism, unspecified: Secondary | ICD-10-CM | POA: Diagnosis not present

## 2024-06-06 DIAGNOSIS — K7460 Unspecified cirrhosis of liver: Secondary | ICD-10-CM | POA: Diagnosis not present

## 2024-06-06 DIAGNOSIS — Z792 Long term (current) use of antibiotics: Secondary | ICD-10-CM | POA: Diagnosis not present

## 2024-06-06 DIAGNOSIS — Z8673 Personal history of transient ischemic attack (TIA), and cerebral infarction without residual deficits: Secondary | ICD-10-CM | POA: Diagnosis not present

## 2024-06-06 DIAGNOSIS — Z9181 History of falling: Secondary | ICD-10-CM | POA: Diagnosis not present

## 2024-06-06 DIAGNOSIS — I480 Paroxysmal atrial fibrillation: Secondary | ICD-10-CM | POA: Diagnosis not present

## 2024-06-06 DIAGNOSIS — I119 Hypertensive heart disease without heart failure: Secondary | ICD-10-CM | POA: Diagnosis not present

## 2024-06-06 DIAGNOSIS — I8510 Secondary esophageal varices without bleeding: Secondary | ICD-10-CM | POA: Diagnosis not present

## 2024-06-06 DIAGNOSIS — Z85828 Personal history of other malignant neoplasm of skin: Secondary | ICD-10-CM | POA: Diagnosis not present

## 2024-06-06 DIAGNOSIS — Z85038 Personal history of other malignant neoplasm of large intestine: Secondary | ICD-10-CM | POA: Diagnosis not present

## 2024-06-06 DIAGNOSIS — Z7982 Long term (current) use of aspirin: Secondary | ICD-10-CM | POA: Diagnosis not present

## 2024-06-06 DIAGNOSIS — K8020 Calculus of gallbladder without cholecystitis without obstruction: Secondary | ICD-10-CM | POA: Diagnosis not present

## 2024-06-06 DIAGNOSIS — D509 Iron deficiency anemia, unspecified: Secondary | ICD-10-CM | POA: Diagnosis not present

## 2024-06-06 LAB — CORTISOL: Cortisol, Plasma: 4.8 ug/dL

## 2024-06-06 NOTE — Telephone Encounter (Signed)
 FYI Only or Action Required?: Action required by provider: would like labs for possible UTI and blood work if possible before appointment on 7/17.  Patient was last seen in primary care on 05/11/2024 by [name], NP.  Called Nurse Triage reporting Fatigue.  Symptoms began yesterday.  Interventions attempted: Rest, hydration, or home remedies.  Symptoms are: unchanged.  Triage Disposition: See Physician Within 24 Hours  Patient/caregiver understands and will follow disposition?: No, wishes to speak with PCP    Copied from CRM [id number]. Topic: Clinical - Red Word Triage >> Jun 06, 2024 12:45 PM Franky GRADE wrote: Red Word that prompted transfer to Nurse Triage:Sreejesh from a [facility] home health is calling with the patient because she is experiencing fatigue and lethargic and overall not feeling well.    Reason for Disposition  Taking a medicine that could cause weakness (e.g., blood pressure medications, diuretics)  Answer Assessment - Initial Assessment Questions Patient has an appointment 7/17. Spouse Valere would like to bring her in for lab work prior to that due to a concern her symptoms may be due to a UTI. He is requesting a call back at [phone] with a response to his request       1. DESCRIPTION: Describe how you are feeling.     Fatigue with some confusion  2. SEVERITY: How bad is it?  Can you stand and walk?     Mild 3. ONSET: When did these symptoms begin? (e.g., hours, days, weeks, months)     Last night  4. CAUSE: What do you think is causing the weakness or fatigue? (e.g., not drinking enough fluids, medical problem, trouble sleeping)     Unsure  5. NEW MEDICINES:  Have you started on any new medicines recently? (e.g., opioid pain medicines, benzodiazepines, muscle relaxants, antidepressants, antihistamines, neuroleptics, beta blockers)     No 6. OTHER SYMPTOMS: Do you have any other symptoms? (e.g., chest pain, fever, cough, SOB, vomiting,  diarrhea, bleeding, other areas of pain)     Some confusion  Protocols used: Weakness (Generalized) and Fatigue-A-AH

## 2024-06-06 NOTE — Telephone Encounter (Signed)
 Patient was admitted to [facility] on 05/20/2024 and discharged on 05/29/2024.  She was admitted with confusion.  Stroke workup was negative.  She was treated with both oral lactulose  and lactulose  enemas.  No obvious bleeding but did receive 3 units of blood for hemoglobin <7.  She had an EGD on 05/28/2024 with multiple gastric ulcers thought to be the cause of her bleeding and heme positive stool.  Based on review in care everywhere discharge medications related to her liver disease included furosemide  20-40 mg daily dependent upon blood pressure readings, midodrine  10 mg 3 times daily.  No obvious indication for office visit.   Please review patient's current home medications to ensure our records are accurate.  If husband is questioning why she is on midodrine , this was started in the hospital because the patient had low blood pressure.  I recommend that the patient continue to monitor blood pressure at home and as long as her blood pressure is not >120/80 I would recommend she continue this for the time being.  I will place orders for updated labs.  Her liver chemistries were stable during admission but it does not appear that she has had a repeat CBC since discharge.  The labs that she had done today did not include CBC or liver enzymes.  If there are any new acute issues occurring I would recommend that she go to the emergency room.

## 2024-06-07 ENCOUNTER — Inpatient Hospital Stay (HOSPITAL_COMMUNITY)
Admission: EM | Admit: 2024-06-07 | Discharge: 2024-06-10 | DRG: 442 | Disposition: A | Discharge status: Home / Self Care | Attending: Internal Medicine | Admitting: Internal Medicine

## 2024-06-07 ENCOUNTER — Encounter (HOSPITAL_COMMUNITY): Payer: Self-pay | Admitting: Emergency Medicine

## 2024-06-07 ENCOUNTER — Other Ambulatory Visit: Payer: Self-pay

## 2024-06-07 ENCOUNTER — Inpatient Hospital Stay (HOSPITAL_COMMUNITY)

## 2024-06-07 DIAGNOSIS — K766 Portal hypertension: Secondary | ICD-10-CM | POA: Diagnosis not present

## 2024-06-07 DIAGNOSIS — Z91041 Radiographic dye allergy status: Secondary | ICD-10-CM | POA: Diagnosis not present

## 2024-06-07 DIAGNOSIS — K259 Gastric ulcer, unspecified as acute or chronic, without hemorrhage or perforation: Secondary | ICD-10-CM | POA: Diagnosis present

## 2024-06-07 DIAGNOSIS — R600 Localized edema: Secondary | ICD-10-CM | POA: Diagnosis present

## 2024-06-07 DIAGNOSIS — K5909 Other constipation: Secondary | ICD-10-CM | POA: Diagnosis not present

## 2024-06-07 DIAGNOSIS — K7460 Unspecified cirrhosis of liver: Secondary | ICD-10-CM | POA: Diagnosis not present

## 2024-06-07 DIAGNOSIS — Z79890 Hormone replacement therapy (postmenopausal): Secondary | ICD-10-CM

## 2024-06-07 DIAGNOSIS — R4182 Altered mental status, unspecified: Secondary | ICD-10-CM | POA: Diagnosis present

## 2024-06-07 DIAGNOSIS — E039 Hypothyroidism, unspecified: Secondary | ICD-10-CM | POA: Diagnosis not present

## 2024-06-07 DIAGNOSIS — Z8261 Family history of arthritis: Secondary | ICD-10-CM

## 2024-06-07 DIAGNOSIS — Z79899 Other long term (current) drug therapy: Secondary | ICD-10-CM | POA: Diagnosis not present

## 2024-06-07 DIAGNOSIS — K7682 Hepatic encephalopathy: Secondary | ICD-10-CM | POA: Diagnosis not present

## 2024-06-07 DIAGNOSIS — K7581 Nonalcoholic steatohepatitis (NASH): Secondary | ICD-10-CM | POA: Diagnosis not present

## 2024-06-07 DIAGNOSIS — Z833 Family history of diabetes mellitus: Secondary | ICD-10-CM

## 2024-06-07 DIAGNOSIS — Z7982 Long term (current) use of aspirin: Secondary | ICD-10-CM | POA: Diagnosis not present

## 2024-06-07 DIAGNOSIS — Z8249 Family history of ischemic heart disease and other diseases of the circulatory system: Secondary | ICD-10-CM

## 2024-06-07 DIAGNOSIS — R188 Other ascites: Secondary | ICD-10-CM | POA: Diagnosis not present

## 2024-06-07 DIAGNOSIS — Z85828 Personal history of other malignant neoplasm of skin: Secondary | ICD-10-CM

## 2024-06-07 DIAGNOSIS — Z809 Family history of malignant neoplasm, unspecified: Secondary | ICD-10-CM | POA: Diagnosis not present

## 2024-06-07 DIAGNOSIS — R5381 Other malaise: Secondary | ICD-10-CM

## 2024-06-07 DIAGNOSIS — Z8673 Personal history of transient ischemic attack (TIA), and cerebral infarction without residual deficits: Secondary | ICD-10-CM

## 2024-06-07 DIAGNOSIS — D61818 Other pancytopenia: Secondary | ICD-10-CM | POA: Diagnosis not present

## 2024-06-07 DIAGNOSIS — R001 Bradycardia, unspecified: Secondary | ICD-10-CM | POA: Diagnosis present

## 2024-06-07 DIAGNOSIS — Z8711 Personal history of peptic ulcer disease: Secondary | ICD-10-CM

## 2024-06-07 DIAGNOSIS — Z822 Family history of deafness and hearing loss: Secondary | ICD-10-CM

## 2024-06-07 DIAGNOSIS — Z9221 Personal history of antineoplastic chemotherapy: Secondary | ICD-10-CM | POA: Diagnosis not present

## 2024-06-07 DIAGNOSIS — R680 Hypothermia, not associated with low environmental temperature: Secondary | ICD-10-CM | POA: Diagnosis present

## 2024-06-07 DIAGNOSIS — D509 Iron deficiency anemia, unspecified: Secondary | ICD-10-CM | POA: Diagnosis present

## 2024-06-07 DIAGNOSIS — I10 Essential (primary) hypertension: Secondary | ICD-10-CM | POA: Diagnosis not present

## 2024-06-07 DIAGNOSIS — Z9049 Acquired absence of other specified parts of digestive tract: Secondary | ICD-10-CM

## 2024-06-07 DIAGNOSIS — I878 Other specified disorders of veins: Secondary | ICD-10-CM | POA: Diagnosis present

## 2024-06-07 DIAGNOSIS — Z85038 Personal history of other malignant neoplasm of large intestine: Secondary | ICD-10-CM | POA: Diagnosis not present

## 2024-06-07 DIAGNOSIS — K7290 Hepatic failure, unspecified without coma: Secondary | ICD-10-CM | POA: Diagnosis not present

## 2024-06-07 DIAGNOSIS — E876 Hypokalemia: Secondary | ICD-10-CM | POA: Diagnosis not present

## 2024-06-07 DIAGNOSIS — K8020 Calculus of gallbladder without cholecystitis without obstruction: Secondary | ICD-10-CM | POA: Diagnosis not present

## 2024-06-07 DIAGNOSIS — R7989 Other specified abnormal findings of blood chemistry: Secondary | ICD-10-CM | POA: Diagnosis present

## 2024-06-07 DIAGNOSIS — R531 Weakness: Secondary | ICD-10-CM | POA: Diagnosis present

## 2024-06-07 DIAGNOSIS — Z800 Family history of malignant neoplasm of digestive organs: Secondary | ICD-10-CM

## 2024-06-07 LAB — CBC
HCT: 34 % — ABNORMAL LOW (ref 36.0–46.0)
Hemoglobin: 10.9 g/dL — ABNORMAL LOW (ref 12.0–15.0)
MCH: 32.4 pg (ref 26.0–34.0)
MCHC: 32.1 g/dL (ref 30.0–36.0)
MCV: 101.2 fL — ABNORMAL HIGH (ref 80.0–100.0)
Platelets: 122 K/uL — ABNORMAL LOW (ref 150–400)
RBC: 3.36 MIL/uL — ABNORMAL LOW (ref 3.87–5.11)
RDW: 19.3 % — ABNORMAL HIGH (ref 11.5–15.5)
WBC: 3.4 K/uL — ABNORMAL LOW (ref 4.0–10.5)
nRBC: 0 % (ref 0.0–0.2)

## 2024-06-07 LAB — COMPREHENSIVE METABOLIC PANEL WITH GFR
ALT: 48 U/L — ABNORMAL HIGH (ref 0–44)
AST: 60 U/L — ABNORMAL HIGH (ref 15–41)
Albumin: 2.9 g/dL — ABNORMAL LOW (ref 3.5–5.0)
Alkaline Phosphatase: 153 U/L — ABNORMAL HIGH (ref 38–126)
Anion gap: 8 (ref 5–15)
BUN: 22 mg/dL (ref 8–23)
CO2: 32 mmol/L (ref 22–32)
Calcium: 8.8 mg/dL — ABNORMAL LOW (ref 8.9–10.3)
Chloride: 100 mmol/L (ref 98–111)
Creatinine, Ser: 1.09 mg/dL — ABNORMAL HIGH (ref 0.44–1.00)
GFR, Estimated: 54 mL/min — ABNORMAL LOW (ref >60)
Glucose, Bld: 107 mg/dL — ABNORMAL HIGH (ref 70–99)
Potassium: 3.4 mmol/L — ABNORMAL LOW (ref 3.5–5.1)
Sodium: 140 mmol/L (ref 135–145)
Total Bilirubin: 1.5 mg/dL — ABNORMAL HIGH (ref 0.0–1.2)
Total Protein: 5.7 g/dL — ABNORMAL LOW (ref 6.5–8.1)

## 2024-06-07 LAB — TSH: TSH: 16.339 u[IU]/mL — ABNORMAL HIGH (ref 0.350–4.500)

## 2024-06-07 LAB — T4, FREE: Free T4: 0.79 ng/dL (ref 0.61–1.12)

## 2024-06-07 LAB — AMMONIA: Ammonia: 159 umol/L — ABNORMAL HIGH (ref 9–35)

## 2024-06-07 MED ORDER — SACCHAROMYCES BOULARDII 250 MG PO CAPS
250.0000 mg | ORAL_CAPSULE | Freq: Every day | ORAL | Status: DC
  Administered 2024-06-08 – 2024-06-10 (×3): 250 mg via ORAL
  Filled 2024-06-07 (×3): qty 1

## 2024-06-07 MED ORDER — ENOXAPARIN SODIUM 40 MG/0.4ML IJ SOSY
40.0000 mg | PREFILLED_SYRINGE | INTRAMUSCULAR | Status: DC
  Administered 2024-06-07 – 2024-06-09 (×3): 40 mg via SUBCUTANEOUS
  Filled 2024-06-07 (×3): qty 0.4

## 2024-06-07 MED ORDER — LACTULOSE 10 GM/15ML PO SOLN: 30.0000 g | Freq: Three times a day (TID) | ORAL | Status: DC

## 2024-06-07 MED ORDER — ORAL CARE MOUTH RINSE: 15.0000 mL | OROMUCOSAL | Status: DC | PRN

## 2024-06-07 MED ORDER — POLYETHYLENE GLYCOL 3350 17 G PO PACK
17.0000 g | PACK | Freq: Every day | ORAL | Status: DC
  Administered 2024-06-08 – 2024-06-10 (×3): 17 g via ORAL
  Filled 2024-06-07 (×3): qty 1

## 2024-06-07 MED ORDER — ASPIRIN 81 MG PO TBEC
81.0000 mg | DELAYED_RELEASE_TABLET | Freq: Every day | ORAL | Status: DC
  Administered 2024-06-08 – 2024-06-10 (×3): 81 mg via ORAL
  Filled 2024-06-07 (×3): qty 1

## 2024-06-07 MED ORDER — SODIUM CHLORIDE 0.9% FLUSH
3.0000 mL | Freq: Two times a day (BID) | INTRAVENOUS | Status: DC
  Administered 2024-06-07 – 2024-06-10 (×6): 3 mL via INTRAVENOUS

## 2024-06-07 MED ORDER — LEVOCARNITINE 330 MG PO TABS: 330.0000 mg | ORAL_TABLET | Freq: Three times a day (TID) | ORAL | Status: DC

## 2024-06-07 MED ORDER — ZINC OXIDE 40 % EX OINT: TOPICAL_OINTMENT | Freq: Every day | CUTANEOUS | Status: DC | PRN

## 2024-06-07 MED ORDER — ADULT MULTIVITAMIN LIQUID CH
Freq: Every day | ORAL | Status: DC
  Administered 2024-06-08: 15 mL via ORAL
  Filled 2024-06-07: qty 15

## 2024-06-07 MED ORDER — LACTULOSE 10 GM/15ML PO SOLN
30.0000 g | Freq: Once | ORAL | Status: AC
  Administered 2024-06-07: 30 g via ORAL
  Filled 2024-06-07: qty 60

## 2024-06-07 MED ORDER — LACTULOSE ENEMA
300.0000 mL | Freq: Once | ORAL | Status: DC
  Filled 2024-06-07: qty 300

## 2024-06-07 MED ORDER — POTASSIUM CHLORIDE 10 MEQ/100ML IV SOLN
10.0000 meq | INTRAVENOUS | Status: AC
  Administered 2024-06-07 (×4): 10 meq via INTRAVENOUS
  Filled 2024-06-07 (×4): qty 100

## 2024-06-07 MED ORDER — MAGNESIUM OXIDE -MG SUPPLEMENT 400 (240 MG) MG PO TABS
400.0000 mg | ORAL_TABLET | Freq: Every day | ORAL | Status: DC
  Administered 2024-06-08 – 2024-06-10 (×3): 400 mg via ORAL
  Filled 2024-06-07 (×4): qty 1

## 2024-06-07 MED ORDER — ONDANSETRON HCL 4 MG PO TABS: 4.0000 mg | ORAL_TABLET | Freq: Four times a day (QID) | ORAL | Status: DC | PRN

## 2024-06-07 MED ORDER — LACTULOSE 10 GM/15ML PO SOLN
30.0000 g | Freq: Four times a day (QID) | ORAL | Status: DC
  Administered 2024-06-07 – 2024-06-10 (×10): 30 g via ORAL
  Filled 2024-06-07 (×10): qty 45

## 2024-06-07 MED ORDER — RIFAXIMIN 550 MG PO TABS
550.0000 mg | ORAL_TABLET | Freq: Two times a day (BID) | ORAL | Status: DC
Start: 2024-06-07 — End: 2024-06-10
  Administered 2024-06-07 – 2024-06-10 (×6): 550 mg via ORAL
  Filled 2024-06-07 (×6): qty 1

## 2024-06-07 MED ORDER — ONDANSETRON HCL 4 MG/2ML IJ SOLN
4.0000 mg | Freq: Four times a day (QID) | INTRAMUSCULAR | Status: DC | PRN
  Administered 2024-06-09: 4 mg via INTRAVENOUS
  Filled 2024-06-07: qty 2

## 2024-06-07 MED ORDER — PANTOPRAZOLE SODIUM 40 MG PO TBEC
40.0000 mg | DELAYED_RELEASE_TABLET | Freq: Two times a day (BID) | ORAL | Status: DC
  Administered 2024-06-07 – 2024-06-10 (×6): 40 mg via ORAL
  Filled 2024-06-07 (×6): qty 1

## 2024-06-07 MED ORDER — MIDODRINE HCL 5 MG PO TABS
10.0000 mg | ORAL_TABLET | Freq: Three times a day (TID) | ORAL | Status: DC
  Administered 2024-06-08 – 2024-06-10 (×7): 10 mg via ORAL
  Filled 2024-06-07 (×7): qty 2

## 2024-06-07 MED ORDER — ENSURE PLUS HIGH PROTEIN PO LIQD: 237.0000 mL | Freq: Two times a day (BID) | ORAL | Status: DC

## 2024-06-07 MED ORDER — LEVOTHYROXINE SODIUM 50 MCG PO TABS
50.0000 ug | ORAL_TABLET | Freq: Every day | ORAL | Status: DC
  Administered 2024-06-08 – 2024-06-09 (×2): 50 ug via ORAL
  Filled 2024-06-07 (×4): qty 1

## 2024-06-07 MED ORDER — SODIUM CHLORIDE 0.9 % IV SOLN: 250.0000 mL | INTRAVENOUS | Status: AC | PRN

## 2024-06-07 MED ORDER — SODIUM CHLORIDE 0.9% FLUSH: 3.0000 mL | INTRAVENOUS | Status: DC | PRN

## 2024-06-07 NOTE — ED Triage Notes (Addendum)
 Patient comes in with spouse. Patient is alert and oriented to self only but has some confusion noted. Pt has a history of liver failure. Husband states that she is confused like last time when she was admitted.

## 2024-06-07 NOTE — Hospital Course (Addendum)
 Patient is 73 years old female with past medical history of hypothyroidism, esophageal varices, colon cancer, history of hepatic encephalopathy presented to the hospital with altered mental status.  She presented with generalized weakness poor appetite nausea anorexia but denied any focal weaknesses.  She was recently in the hospital and has been on lactulose  since discharge.  Over the last few days has been more weak and withdrawn and her lactulose  dose was increased from 45 mg to 60 mg 3 times daily.  In the ED patient was slightly bradycardic.  Labs were notable for pancytopenia with WBC of 3.4 with hemoglobin 10.9 and platelets at 122.  Chemistry showed sodium 140 with potassium of 3.4 creatinine at 1.0.  ALT and AST elevated at 60 and 48 respectively.  Bilirubin at 1.5.  ESS was elevated at 16.3.  Ammonia elevated at 159.  GI was consulted from the ED and was considered for admission to hospital for further evaluation and treatment.  Noncirrhotic portal hypertension and suspected NASH cirrhosis with acute hepatic encephalopathy, esophageal varices.  History of TIPS in the past.  Has had recurrent admission for the same and follows up at [facility].  On lactulose  as outpatient.  GI has been consulted.  Continue lactulose .  Closely monitor.  Hypokalemia potassium 3.4 will aggressively replace in the context of hepatic encephalopathy.  Hypothyroidism.  Significant elevation of TSH.  Will get T3-T4.  Might need to be reassessed in later  Pancytopenia likely secondary to liver disease.  Continue to monitor.  No evidence of bleeding at this time.  History of hepatic encephalopathy esophageal varices.  Will continue to monitor.  History of colon cancer.  Stage IIIb.  Status post hemicolectomy and chemotherapy in the past.  History of CVA 11/2023.  Was on Plavix  for a month.  Currently on aspirin .

## 2024-06-07 NOTE — H&P (Addendum)
 [facility] History and Physical  [name] FMW:[id number] DOB: 10-Apr-1951 DOA: 06/07/2024  Referring physician: ED  PCP: [name], NP   Patient is coming from: home   Chief Complaint: Altered mental status  HPI:  Patient is 73 years old female with past medical history of hypothyroidism, esophageal varices, colon cancer, history of hepatic encephalopathy presented to the hospital with altered mental status.   She was recently in the hospital and has been on lactulose  since discharge.  She has been prescribed Protonix  as well for peptic ulcer disease from last admission.  Patient stated that her last bowel movement was yesterday at 2 PM..  Over the last few days has been more weak and withdrawn and her lactulose  dose was increased from 45 mg to 60 mg 3 times daily by her husband..  Normally he is able to get up and about and do cooking and other staff at home.  Normally she moves his bowels once a day and rarely moves more than 2 times a day putting on lactulose .  Patient denies any fever, chills or rigor.  Denies any nausea vomiting or abdominal pain.  Denies any shortness of breath dyspnea cough congestion or chest pain.  In the ED, patient was slightly bradycardic.  Labs were notable for pancytopenia with WBC of 3.4 with hemoglobin 10.9 and platelets at 122.  Chemistry showed sodium 140 with potassium of 3.4 creatinine at 1.0.  ALT and AST elevated at 60 and 48 respectively.  Bilirubin at 1.5.  ESS was elevated at 16.3.  Ammonia elevated at 159.  GI was consulted from the ED and was considered for admission to hospital for further evaluation and treatment.   Assessment and Plan Principal Problem:   Hepatic encephalopathy (HCC) Active Problems:   Cirrhosis (HCC)   Acquired hypothyroidism   Hx of colon cancer, stage III   Portal hypertension (HCC)   AMS (altered mental status)   Noncirrhotic portal hypertension and suspected NASH cirrhosis with acute hepatic  encephalopathy, esophageal varices.  History of TIPS in the past.  Has had recurrent admission for the same and follows up at [facility].  On lactulose  as outpatient but has not had a bowel movement in 2 days.  GI has been consulted and at bedside..  Continue lactulose  to ensure 2-3 bowel movements a day..  Closely monitor.  On rifaximin  at home will continue.  Hypokalemia potassium 3.4 will  replace with IV KCl 40 mill equivalents today.  Check levels in AM.  Hold off with Lasix  today.  Hypothyroidism.  Significant elevation of TSH.  Will get T3-T4.  Might need thyroid  function to be reassessed later.  Continue Synthroid  50 mcg from home.  Pancytopenia likely secondary to liver disease.  Continue to monitor.  No evidence of bleeding at this time.  Will continue to monitor closely.  History of colon cancer.  Stage IIIb.  Status post hemicolectomy and chemotherapy in the past.  Recent history of peptic ulcer disease.  On Protonix  40 twice daily.  Will continue.  History of CVA 11/2023.  Was on Plavix  for a month.  Currently on aspirin .  Has generalized weakness at this time.  Will get PT OT evaluation.  DVT Prophylaxis: Lovenox  subcu  Review of Systems:  All systems were reviewed and were negative unless otherwise mentioned in the HPI   Past Medical History:  Diagnosis Date   Acute hepatic encephalopathy (HCC) 03/30/2024   Acute urinary retention 05/04/2022   Allergy 2006 ?  Contrast dye   Arthritis 2016 ??   Knees and thumb   Cancer [facility])    cecum   Cataract 2021   Surgery scheduled July 2023   Colon cancer [facility]) 2003   Elevated liver function tests    Esophageal varices (HCC)    Heart murmur On file   Hemorrhage of gastrointestinal tract 05/04/2011   Hypertension 2021   Hypothyroidism    Iron deficiency anemia    Liver disease    chemotherapy complication, per pt, shunts placed to bypass liver   Malignant neoplasm of cecum (HCC)    Portal hypertension (HCC)     Skin cancer 2019   Splenomegaly    Past Surgical History:  Procedure Laterality Date   COLON SURGERY  2004   Cancer   COSMETIC SURGERY  2021   Skin cancer   ESOPHAGEAL VARICE LIGATION     ESOPHAGOGASTRODUODENOSCOPY N/A 05/28/2024   Procedure: EGD (ESOPHAGOGASTRODUODENOSCOPY);  Surgeon: Albertus Gordy HERO, MD;  Location: [facility];  Service: Gastroenterology;  Laterality: N/A;   EYE SURGERY     HEMICOLECTOMY  01/08/2003   IR RADIOLOGIST EVAL & MGMT  12/20/2020   IR RADIOLOGIST EVAL & MGMT  05/29/2021   LIVER SURGERY     shunts placed after chemo complication   SKIN FULL THICKNESS GRAFT N/A 09/12/2019   Procedure: debridement and FTSG to the nose from left upper arm;  Surgeon: Elisabeth Craig RAMAN, MD;  Location: [facility];  Service: Plastics;  Laterality: N/A;  2 hours, please   TIPS PROCEDURE      Social History:  reports that she has never smoked. She has never used smokeless tobacco. She reports that she does not drink alcohol  and does not use drugs.  Allergies  Allergen Reactions   Contrast Media [Iodinated Contrast Media] Hives    Family History  Problem Relation Age of Onset   Arthritis Mother    Hearing loss Mother    Heart disease Mother    Hypertension Mother    Miscarriages / [place] Mother    Arthritis Father    Diabetes Father    Heart disease Father    Cancer Maternal Aunt    Breast cancer Neg Hx    Colon cancer Neg Hx    Esophageal cancer Neg Hx    Pancreatic cancer Neg Hx    Stomach cancer Neg Hx    Stroke Neg Hx      Prior to Admission medications   Medication Sig Start Date End Date Taking? Authorizing Provider  Accu-Chek Softclix Lancets lancets 3 (three) times daily. 04/05/24   [provider]  aspirin  EC 81 MG tablet Take 1 tablet (81 mg total) by mouth daily. Swallow whole. Patient taking differently: Take 81 mg by mouth in the morning. Swallow whole. 12/26/23   Bryn Bernardino NOVAK, MD  Blood Glucose Monitoring Suppl DEVI 1 each by  Does not apply route in the morning, at noon, and at bedtime. May substitute to any manufacturer covered by patient's insurance. 04/05/24   Rojelio Nest, DO  CRANBERRY PO Take 1 tablet by mouth 2 (two) times daily.    [provider]  feeding supplement (ENSURE PLUS HIGH PROTEIN) LIQD Take 237 mLs by mouth 2 (two) times daily between meals. 05/28/24 06/27/24  Willette Adriana LABOR, MD  ferrous sulfate  325 (65 FE) MG tablet Take 1 tablet (325 mg total) by mouth 2 (two) times daily with a meal. 05/28/24 08/26/24  Shahmehdi, Adriana LABOR, MD  furosemide  (LASIX )  20 MG tablet Take 20-40 mg by mouth See admin instructions. Take blood pressure and depending on reading take 20mg  or 40mg  by mouth daily.    [provider]  lactulose  (CHRONULAC ) 10 GM/15ML solution Take 45 mLs (30 g total) by mouth 3 (three) times daily. 03/13/23   Khamille Beynon, MD  levOCARNitine  (CARNITOR ) 330 MG tablet Take 330 mg by mouth 3 (three) times daily. 05/23/22   [provider]  levothyroxine  (SYNTHROID ) 50 MCG tablet Take 1 tablet (50 mcg total) by mouth daily at 6 (six) AM. 05/30/24 07/29/24  Shahmehdi, Adriana LABOR, MD  liver oil-zinc  oxide (DESITIN) 40 % ointment Apply topically daily as needed for irritation. 03/13/23   Rokia Bosket, Vernal, MD  magnesium  oxide (MAG-OX) 400 MG tablet Take 1 tablet (400 mg total) by mouth daily. Patient taking differently: Take 400 mg by mouth in the morning. 04/05/24   Rojelio Nest, DO  methylPREDNISolone  (MEDROL  DOSEPAK) 4 MG TBPK tablet Medrol  Dosepak: take as instructed on package 05/29/24   Willette Adriana LABOR, MD  midodrine  (PROAMATINE ) 10 MG tablet Take 1 tablet (10 mg total) by mouth 3 (three) times daily with meals. 05/29/24 07/28/24  Shahmehdi, Seyed A, MD  Multiple Vitamin (MULTI-VITAMIN) tablet Take 1 tablet by mouth in the morning.    [provider]  pantoprazole  (PROTONIX ) 40 MG tablet Take 1 tablet (40 mg total) by mouth 2 (two) times daily. 05/28/24 06/27/24  Willette Adriana LABOR, MD   Probiotic Product (PROBIOTIC PEARLS WOMENS) CAPS Take by mouth daily at 6 (six) AM.    [provider]  rifaximin  (XIFAXAN ) 550 MG TABS tablet Take 1 tablet (550 mg total) by mouth 2 (two) times daily. Patient taking differently: Take 550 mg by mouth as needed. Flareups 12/25/23   Bryn Bernardino NOVAK, MD    Physical Exam:  Vitals:   06/07/24 1311 06/07/24 1400 06/07/24 1500 06/07/24 1600  BP:  106/61 118/60 114/64  Pulse:  (!) 53 (!) 53 (!) 53  Resp:  16 16 16   Temp:      TempSrc:      SpO2:  91% 93% 93%  Height: 5' 3 (1.6 m)      Wt Readings from Last 3 Encounters:  06/02/24 72.1 kg  05/29/24 70.9 kg  05/11/24 69.5 kg   Body mass index is 28.17 kg/m.  General:  Average built, not in obvious distress, alert awake and communicative, oriented to place person in disoriented to time.  Elderly female.  Slow to respond HENT: Normocephalic, No scleral pallor or icterus noted. Oral mucosa is moist.  Chest:  No crackles or wheezes.  Decreased breath sounds bilaterally CVS: S1 &S2 heard. No murmur.  Regular rate and rhythm. Abdomen: Soft, nontender slightly distended.  Nontender. Extremities: No cyanosis, clubbing with bilateral lower extremity edema  Psych: Alert, awake and communicative slow to respond, elderly female CNS:  No cranial nerve deficits.  Generalized weakness noted Skin: Warm and dry.  No rashes noted.  Labs on Admission:   CBC: Recent Labs  Lab 06/07/24 1340  WBC 3.4*  HGB 10.9*  HCT 34.0*  MCV 101.2*  PLT 122*    Basic Metabolic Panel: Recent Labs  Lab 06/07/24 1340  NA 140  K 3.4*  CL 100  CO2 32  GLUCOSE 107*  BUN 22  CREATININE 1.09*  CALCIUM  8.8*    Liver Function Tests: Recent Labs  Lab 06/07/24 1340  AST 60*  ALT 48*  ALKPHOS 153*  BILITOT 1.5*  PROT 5.7*  ALBUMIN  2.9*   No results for input(s): LIPASE, AMYLASE in the last 168 hours. Recent Labs  Lab 06/07/24 1340  AMMONIA 159*    Cardiac Enzymes: No results for  input(s): CKTOTAL, CKMB, CKMBINDEX, TROPONINI in the last 168 hours.  BNP (last 3 results) No results for input(s): BNP in the last 8760 hours.  ProBNP (last 3 results) Recent Labs    08/10/23 0946  PROBNP 45.0    CBG: No results for input(s): GLUCAP in the last 168 hours.  Lipase     Component Value Date/Time   LIPASE 37 12/21/2023 0855     Urinalysis    Component Value Date/Time   COLORURINE YELLOW 05/20/2024 1806   APPEARANCEUR CLOUDY (A) 05/20/2024 1806   LABSPEC 1.013 05/20/2024 1806   PHURINE 7.0 05/20/2024 1806   GLUCOSEU NEGATIVE 05/20/2024 1806   GLUCOSEU NEGATIVE 08/04/2023 0916   HGBUR NEGATIVE 05/20/2024 1806   BILIRUBINUR NEGATIVE 05/20/2024 1806   BILIRUBINUR negative 12/06/2023 1117   BILIRUBINUR Negative 02/04/2023 0917   KETONESUR NEGATIVE 05/20/2024 1806   PROTEINUR NEGATIVE 05/20/2024 1806   UROBILINOGEN 0.2 12/06/2023 1117   UROBILINOGEN 0.2 08/04/2023 0916   NITRITE NEGATIVE 05/20/2024 1806   LEUKOCYTESUR NEGATIVE 05/20/2024 1806     Drugs of Abuse     Component Value Date/Time   LABOPIA NONE DETECTED 05/20/2024 0428   COCAINSCRNUR NONE DETECTED 05/20/2024 0428   LABBENZ NONE DETECTED 05/20/2024 0428   AMPHETMU NONE DETECTED 05/20/2024 0428   THCU NONE DETECTED 05/20/2024 0428   LABBARB NONE DETECTED 05/20/2024 0428      Radiological Exams on Admission: No results found.  EKG: Not available for review   Consultant: GI  Code Status: Full code  Microbiology none  Antibiotics: None  Family Communication:  Patients' condition and plan of care including tests being ordered have been discussed with the patient and the patient's husband at bedside who indicate understanding and agree with the plan.  Severity of Illness: The appropriate patient status for this patient is INPATIENT. Inpatient status is judged to be reasonable and necessary in order to provide the required intensity of service to ensure the patient's safety.  The patient's presenting symptoms, physical exam findings, and initial radiographic and laboratory data in the context of their chronic comorbidities is felt to place them at high risk for further clinical deterioration. Furthermore, it is not anticipated that the patient will be medically stable for discharge from the hospital within 2 midnights of admission.   * I certify that at the point of admission it is my clinical judgment that the patient will require inpatient hospital care spanning beyond 2 midnights from the point of admission due to high intensity of service, high risk for further deterioration and high frequency of surveillance required.*  Signed, Vernal Alstrom, MD [facility] 06/07/2024

## 2024-06-07 NOTE — ED Notes (Signed)
 Pt ambulatory to restroom, had a large BM

## 2024-06-07 NOTE — Consult Note (Addendum)
 Referring Provider: EDP Primary Care Physician:  [name], NP Primary Gastroenterologist:  Dr. Abran  Reason for Consultation:  HE  HPI: [name] is a 73 y.o. 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  She was just hospitalized 6/28 through 7/7 with acute metabolic encephalopathy combination of hepatic encephalopathy as well as acute on chronic anemia with heme positive stool.  EGD performed as below.  Was transfused with PRBCs.    Returns here now with recurrent issues of confusion.  Her husband says that yesterday she started to be confused.  He increased her lactulose  to 60 g 3 times daily, but despite that she still remained confused this morning so he brought her to the emergency department.  He says that even with very high doses of lactulose  by mouth he is  lucky to get one good bowel movement out of her daily.  On occasion she will have 2, but that is rare.  Says that the lactulose  enemas tend to work better for her.  No other concerning signs of infection.  TSH has been off and they are working to correct that.  Actually has an appt with endocrinology tomorrow.  They say that she has been eating well.  No abdominal pain.  EGD 05/28/2024:  - Normal esophagus. - Small hiatal hernia. - No esophageal or gastric varices.- Non- bleeding gastric ulcers with antral gastritis Biopsied. - Normal examined duodenum.  A. STOMACH, BIOPSY:  - Chronic inactive gastritis.  - Immunohistochemical stain for H. pylori is negative.    GI history: Taken from previous notes. She developed Oxaliplatin induced non-cirrhotic portal  hypertension with esophageal varices and had a variceal hemorrhage in 2012 for which she underwent TIPS at [facility]. Her most recent EGD was in 2010. Doppler ultrasound of the liver 11/2020 that questioned possible TIPS stenosis. She was seen by interventional radiology and decision was made not to attempt revision given new hepatic encephalopathy. Since then, she has had multiple hospitalizations for encephalopathy, including hospital admission 02/25/2023 through 03/13/2023 with encephalopathy, AKI, cystitis, and bradycardia with SVT. This hospitalization occurred after patient fell and broke her left wrist and was given Oxycodone . She became constipated and encephalopathic. Treated with Lactulose  via NG tube and Xifaxan  in the hospital. UTI treated. Her most recent hospital admission with hepatic encephalopathy was 12/23/2023 which improved after receiving Xifaxan  in conjunction with Lactulose . Brain MRI showed subacute infarcts, followed by neurology. Sono imaging showed in stent stenosis of her TIPS and in light of her neurological findings, TIPS interrogation/potential revision was deferred to the outpatient setting but was not pursued. She is followed by Stephane Quest NP at [facility]. Patient is a poor candidate for transplant given history of adenocarcinoma of the colon and advanced age. Last surveillance colonoscopy was in 2015. Paternal grandmother diagnosed with colon cancer in her 40s.     Past Medical History:  Diagnosis Date   Acute hepatic encephalopathy (HCC) 03/30/2024   Acute urinary retention 05/04/2022   Allergy 2006 ?   Contrast dye   Arthritis 2016 ??   Knees and thumb   Cancer [facility])    cecum   Cataract 2021   Surgery scheduled July 2023   Colon cancer [facility]) 2003  Elevated liver function tests    Esophageal varices (HCC)    Heart murmur On file   Hemorrhage of gastrointestinal tract 05/04/2011   Hypertension 2021   Hypothyroidism    Iron deficiency anemia     Liver disease    chemotherapy complication, per pt, shunts placed to bypass liver   Malignant neoplasm of cecum (HCC)    Portal hypertension (HCC)    Skin cancer 2019   Splenomegaly     Past Surgical History:  Procedure Laterality Date   COLON SURGERY  2004   Cancer   COSMETIC SURGERY  2021   Skin cancer   ESOPHAGEAL VARICE LIGATION     ESOPHAGOGASTRODUODENOSCOPY N/A 05/28/2024   Procedure: EGD (ESOPHAGOGASTRODUODENOSCOPY);  Surgeon: Albertus Gordy HERO, MD;  Location: [facility];  Service: Gastroenterology;  Laterality: N/A;   EYE SURGERY     HEMICOLECTOMY  01/08/2003   IR RADIOLOGIST EVAL & MGMT  12/20/2020   IR RADIOLOGIST EVAL & MGMT  05/29/2021   LIVER SURGERY     shunts placed after chemo complication   SKIN FULL THICKNESS GRAFT N/A 09/12/2019   Procedure: debridement and FTSG to the nose from left upper arm;  Surgeon: Elisabeth Craig RAMAN, MD;  Location: [facility];  Service: Plastics;  Laterality: N/A;  2 hours, please   TIPS PROCEDURE      Prior to Admission medications   Medication Sig Start Date End Date Taking? Authorizing Provider  Accu-Chek Softclix Lancets lancets 3 (three) times daily. 04/05/24   [provider]  aspirin  EC 81 MG tablet Take 1 tablet (81 mg total) by mouth daily. Swallow whole. Patient taking differently: Take 81 mg by mouth in the morning. Swallow whole. 12/26/23   Bryn Bernardino NOVAK, MD  Blood Glucose Monitoring Suppl DEVI 1 each by Does not apply route in the morning, at noon, and at bedtime. May substitute to any manufacturer covered by patient's insurance. 04/05/24   Rojelio Nest, DO  CRANBERRY PO Take 1 tablet by mouth 2 (two) times daily.    [provider]  feeding supplement (ENSURE PLUS HIGH PROTEIN) LIQD Take 237 mLs by mouth 2 (two) times daily between meals. 05/28/24 06/27/24  Willette Adriana LABOR, MD  ferrous sulfate  325 (65 FE) MG tablet Take 1 tablet (325 mg total) by mouth 2 (two) times daily with a meal. 05/28/24 08/26/24   Shahmehdi, Adriana LABOR, MD  furosemide  (LASIX ) 20 MG tablet Take 20-40 mg by mouth See admin instructions. Take blood pressure and depending on reading take 20mg  or 40mg  by mouth daily.    [provider]  lactulose  (CHRONULAC ) 10 GM/15ML solution Take 45 mLs (30 g total) by mouth 3 (three) times daily. 03/13/23   Pokhrel, Laxman, MD  levOCARNitine  (CARNITOR ) 330 MG tablet Take 330 mg by mouth 3 (three) times daily. 05/23/22   [provider]  levothyroxine  (SYNTHROID ) 50 MCG tablet Take 1 tablet (50 mcg total) by mouth daily at 6 (six) AM. 05/30/24 07/29/24  Shahmehdi, Adriana LABOR, MD  liver oil-zinc  oxide (DESITIN) 40 % ointment Apply topically daily as needed for irritation. 03/13/23   Pokhrel, Vernal, MD  magnesium  oxide (MAG-OX) 400 MG tablet Take 1 tablet (400 mg total) by mouth daily. Patient taking differently: Take 400 mg by mouth in the morning. 04/05/24   Rojelio Nest, DO  methylPREDNISolone  (MEDROL  DOSEPAK) 4 MG TBPK tablet Medrol  Dosepak: take as instructed on package 05/29/24   Willette Adriana LABOR, MD  midodrine  (PROAMATINE ) 10 MG tablet Take  1 tablet (10 mg total) by mouth 3 (three) times daily with meals. 05/29/24 07/28/24  Shahmehdi, Seyed A, MD  Multiple Vitamin (MULTI-VITAMIN) tablet Take 1 tablet by mouth in the morning.    [provider]  pantoprazole  (PROTONIX ) 40 MG tablet Take 1 tablet (40 mg total) by mouth 2 (two) times daily. 05/28/24 06/27/24  Willette Adriana LABOR, MD  Probiotic Product (PROBIOTIC PEARLS WOMENS) CAPS Take by mouth daily at 6 (six) AM.    [provider]  rifaximin  (XIFAXAN ) 550 MG TABS tablet Take 1 tablet (550 mg total) by mouth 2 (two) times daily. Patient taking differently: Take 550 mg by mouth as needed. Flareups 12/25/23   Bryn Bernardino NOVAK, MD    Current Facility-Administered Medications  Medication Dose Route Frequency Provider Last Rate Last Admin   lactulose  (CHRONULAC ) 10 GM/15ML solution 30 g  30 g Oral Once Garrick Charleston, MD        Current Outpatient Medications  Medication Sig Dispense Refill   Accu-Chek Softclix Lancets lancets 3 (three) times daily.     aspirin  EC 81 MG tablet Take 1 tablet (81 mg total) by mouth daily. Swallow whole. (Patient taking differently: Take 81 mg by mouth in the morning. Swallow whole.) 90 tablet 0   Blood Glucose Monitoring Suppl DEVI 1 each by Does not apply route in the morning, at noon, and at bedtime. May substitute to any manufacturer covered by patient's insurance. 1 each 0   CRANBERRY PO Take 1 tablet by mouth 2 (two) times daily.     feeding supplement (ENSURE PLUS HIGH PROTEIN) LIQD Take 237 mLs by mouth 2 (two) times daily between meals. 14220 mL 0   ferrous sulfate  325 (65 FE) MG tablet Take 1 tablet (325 mg total) by mouth 2 (two) times daily with a meal. 180 tablet 0   furosemide  (LASIX ) 20 MG tablet Take 20-40 mg by mouth See admin instructions. Take blood pressure and depending on reading take 20mg  or 40mg  by mouth daily.     lactulose  (CHRONULAC ) 10 GM/15ML solution Take 45 mLs (30 g total) by mouth 3 (three) times daily. 236 mL 0   levOCARNitine  (CARNITOR ) 330 MG tablet Take 330 mg by mouth 3 (three) times daily.     levothyroxine  (SYNTHROID ) 50 MCG tablet Take 1 tablet (50 mcg total) by mouth daily at 6 (six) AM. 30 tablet 1   liver oil-zinc  oxide (DESITIN) 40 % ointment Apply topically daily as needed for irritation. 56.7 g 0   magnesium  oxide (MAG-OX) 400 MG tablet Take 1 tablet (400 mg total) by mouth daily. (Patient taking differently: Take 400 mg by mouth in the morning.) 30 tablet 0   methylPREDNISolone  (MEDROL  DOSEPAK) 4 MG TBPK tablet Medrol  Dosepak: take as instructed on package 21 tablet 0   midodrine  (PROAMATINE ) 10 MG tablet Take 1 tablet (10 mg total) by mouth 3 (three) times daily with meals. 180 tablet 0   Multiple Vitamin (MULTI-VITAMIN) tablet Take 1 tablet by mouth in the morning.     pantoprazole  (PROTONIX ) 40 MG tablet Take 1 tablet (40 mg total) by  mouth 2 (two) times daily. 60 tablet 0   Probiotic Product (PROBIOTIC PEARLS WOMENS) CAPS Take by mouth daily at 6 (six) AM.     rifaximin  (XIFAXAN ) 550 MG TABS tablet Take 1 tablet (550 mg total) by mouth 2 (two) times daily. (Patient taking differently: Take 550 mg by mouth as needed. Flareups) 60 tablet 0    Allergies as of 06/07/2024 -  Review Complete 06/07/2024  Allergen Reaction Noted   Contrast media [iodinated contrast media] Hives 11/30/2011    Family History  Problem Relation Age of Onset   Arthritis Mother    Hearing loss Mother    Heart disease Mother    Hypertension Mother    Miscarriages / [place] Mother    Arthritis Father    Diabetes Father    Heart disease Father    Cancer Maternal Aunt    Breast cancer Neg Hx    Colon cancer Neg Hx    Esophageal cancer Neg Hx    Pancreatic cancer Neg Hx    Stomach cancer Neg Hx    Stroke Neg Hx     Social History   Socioeconomic History   Marital status: Married    Spouse name: Animal nutritionist   Number of children: 2   Years of education: Not on file   Highest education level: Some college, no degree  Occupational History   Occupation: Comptroller  Tobacco Use   Smoking status: Never   Smokeless tobacco: Never   Tobacco comments:    Never smoked  Vaping Use   Vaping status: Never Used  Substance and Sexual Activity   Alcohol  use: Never   Drug use: Never   Sexual activity: Not Currently    Partners: Male  Other Topics Concern   Not on file  Social History Narrative   12/04/20   From: the area   Living: with husband, Valere (1994)   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      Family: 2 children Designer, fashion/clothing and Optometrist - 2 grandchildren - nearby      Enjoys: read      Exercise: trying to get back to exercise   Diet: healthy, limits fast foods      Safety   Seat belts: Yes    Guns: Yes  and secure   Safe in relationships: Yes    Social Drivers of Corporate investment banker Strain: Low Risk  (05/10/2024)    Overall Financial Resource Strain (CARDIA)    Difficulty of Paying Living Expenses: Not very hard  Food Insecurity: No Food Insecurity (05/21/2024)   Hunger Vital Sign    Worried About Running Out of Food in the Last Year: Never true    Ran Out of Food in the Last Year: Never true  Transportation Needs: No Transportation Needs (05/21/2024)   PRAPARE - Administrator, Civil Service (Medical): No    Lack of Transportation (Non-Medical): No  Physical Activity: Insufficiently Active (05/10/2024)   Exercise Vital Sign    Days of Exercise per Week: 2 days    Minutes of Exercise per Session: 20 min  Stress: No Stress Concern Present (05/10/2024)   [employer] of Occupational Health - Occupational Stress Questionnaire    Feeling of Stress: Only a little  Social Connections: Socially Integrated (05/10/2024)   Social Connection and Isolation Panel    Frequency of Communication with Friends and Family: Twice a week    Frequency of Social Gatherings with Friends and Family: Once a week    Attends Religious Services: More than 4 times per year    Active Member of [employer] or Organizations: Yes    Attends Engineer, structural: More than 4 times per year    Marital Status: Married  Recent Concern: Social Connections - Moderately Isolated (03/30/2024)   Social Connection and Isolation Panel    Frequency of Communication with Friends and Family: Patient unable  to answer    Frequency of Social Gatherings with Friends and Family: More than three times a week    Attends Religious Services: Never    Database administrator or Organizations: No    Attends Banker Meetings: Never    Marital Status: Married  Catering manager Violence: Not At Risk (04/16/2024)   Humiliation, Afraid, Rape, and Kick questionnaire    Fear of Current or Ex-Partner: No    Emotionally Abused: No    Physically Abused: No    Sexually Abused: No    Review of Systems: ROS is O/W negative as  mentioned in HPI.  Physical Exam: Vital signs in last 24 hours: Temp:  [97.6 F (36.4 C)] 97.6 F (36.4 C) (07/16 1304) Pulse Rate:  [53-59] 53 (07/16 1500) Resp:  [16] 16 (07/16 1500) BP: (106-118)/(60-70) 118/60 (07/16 1500) SpO2:  [91 %-97 %] 93 % (07/16 1500)   General:  Alert, chronically ill-appearing, pleasant and cooperative in NAD; tearful Head:  Normocephalic and atraumatic. Eyes:  Sclera clear, no icterus.  Conjunctiva pink. Ears:  Normal auditory acuity. Mouth:  No deformity or lesions.   Lungs:  Clear throughout to auscultation.   No wheezes, crackles, or rhonchi.  Heart:  Slightly bradycardic but regular rhythm; no murmurs, clicks, rubs, or gallops. Abdomen:  Soft, non-distended.  BS present.  Non-tender.   Msk:  Symmetrical without gross deformities. Pulses:  Normal pulses noted. Extremities:  Pitting edema noted in B/L LEs. Neurologic:  Alert and oriented to self and year.  Knew she was at the hospital but thought it was [facility]; grossly normal neurologically.  Slight asterixis. Skin:  Intact without significant lesions or rashes. Psych:  Alert and cooperative. Normal mood and affect.  Lab Results: Recent Labs    06/07/24 1340  WBC 3.4*  HGB 10.9*  HCT 34.0*  PLT 122*   BMET Recent Labs    06/07/24 1340  NA 140  K 3.4*  CL 100  CO2 32  GLUCOSE 107*  BUN 22  CREATININE 1.09*  CALCIUM  8.8*   LFT Recent Labs    06/07/24 1340  PROT 5.7*  ALBUMIN  2.9*  AST 60*  ALT 48*  ALKPHOS 153*  BILITOT 1.5*   IMPRESSION:  *Noncirrhotic portal hypertension and suspected MASH cirrhosis: With esophageal varices status post variceal hemorrhage in 2012 status post TIPS, thrombocytopenia, hepatic encephalopathy with multiple readmissions for the same, also follows [facility].  *HE:  Ammonia level 159 here and increasing confusion at home despite increasing lactulose  dosing.  Hemoglobin is improving.  No other overt signs of infection to  precipitate.  Husband wondering if it is still recovery from her ulcer seen on endoscopy last week could still be causing issues her ammonia/HE (had non-bleeding gastric ulcers on EGD 7/6).  Ultrasound with doppler ok 7/4.  *Embolic CVA 11/2023: Was initially on Plavix  for a month, now on a baby aspirin .  *History of adenocarcinoma of the cecum: Stage IIIb, T3 N1 with lymph node mass measuring 4 cm, status post hemicolectomy and adjuvant FOLFOX x 12 cycles from 3/15-11/11/2002   PLAN: -I am ordering a lactulose  enema.  Her husband says that tends to be what really works for her.  I have also then ordered her home dosing of lactulose  45 mL / 30 g 3 times daily. - Continue Xifaxan  550 mg BID. - Await results of UA. -Will check ultrasound to look for ascites. -Await further input from Dr. Avram.   Harlene BIRCH.  Zehr  06/07/2024, 3:58 PM    [facility] Attending   I have taken an interval history, reviewed the chart and examined the patient. I agree with the Advanced Practitioner's note, impression and recommendations with the following additions:   73 year old woman with oxaliplatin (chemotherapy for colon cancer) and MASH cirrhosis status post TIPS with some stenosis, and is admitted with recurrent/question refractory hepatic encephalopathy.  She has constipation problems despite being on lactulose .  She has been admitted 4 times this year, 3 of which were for hepatic encephalopathy most recently on June 28.  She may have been encephalopathic on the other admission in May but also found above multiple strokes and was going to have discussion about closure of patent foramen ovale.  She was seen by Dr. Albertus in July and had multiple small gastric ulcers found that an EGD but no clear cause of repeat encephalopathy.  She only moves her bowels about once a day and this is despite taking lactulose .  Note lactulose  was recommended at 30 g 4 times a day when last seen in the liver clinic so I will change  to that dose.  After that visit she did stay on Xifaxan  500 mg twice daily.  Husband would use it intermittently when she would seem to deteriorate but he seems to now understand this needs to be a regularly used medication.  It has been cost prohibitive.  No other focal findings or obvious signs of infection as above.  Hemoglobin stable I do not think GI bleeding is causing encephalopathy problems.  Refractory encephalopathy can be due to constipation (husband says she generally does well only having 1 formed bowel movement a day but I have to wonder if that is not part of the problem), thiamine  deficiency, and the opposite of what she has with her TIPS (stenosis).  We have not thought that she has had progressive deterioration of liver but that could be an issue also.  I am checking a thiamine  level.  We have ordered lactulose  enema and I am going to make the lactulose  oral 4 times a day as above.  Follow-up on abnormal thyroid  tests as well.  She has significant elevation of TSH though free T4 has been normal.  TSH is improved from last time however.  That is being rechecked.  She did have a slightly low free T3 earlier this year.   She was going to see endocrine but will miss that appointment with hospitalization.  From what I have researched, it sounds like this could be contributing to her hepatic encephalopathy problems.  I did not speak to husband about this we will need to clarify tomorrow with respect to adherence to Synthroid  dosing and what dose she was on she was supposed to be on 50 mcg but on 1 search I did it says not taking.   She saw endocrine in April when she was being seen for adrenal insufficiency issues which either were not a problem or resolved, and at that time TSH and free T4 were normal.    Will also see if she has any ascites and a paracentesis would be appropriate to check for SBP if possible.   Lupita CHARLENA Commander, MD, [facility] See [name] on call -  gastroenterology for best contact person 06/07/2024 6:07 PM

## 2024-06-07 NOTE — Telephone Encounter (Signed)
 She has an appointment with me tomorrow. I am ok going ahead and getting a urine sample but needs to keep the appointment with me for 06/08/2024

## 2024-06-07 NOTE — ED Provider Notes (Signed)
 [place] EMERGENCY DEPARTMENT AT [facility] Provider Note   CSN: [id number] Arrival date & time: 06/07/24  1259     Patient presents with: Altered Mental Status   [name] is a 73 y.o. female.   HPI With a history of enceph apathy presents with her husband who provides much of the history. The patient herself offers questions briefly, seemingly appropriately, acknowledges feeling weak, poor, nauseous, anorexia, but denies focal pain. Seemingly since last discharge patient has been on lactulose , Synthroid , take medication as directed.  Over the past few days as the patient has become more withdrawn, listless, her lactulose  dosing has increased from 45 mg 3 times daily, now 60 mg 3 times daily.    Prior to Admission medications   Medication Sig Start Date End Date Taking? Authorizing Provider  Accu-Chek Softclix Lancets lancets 3 (three) times daily. 04/05/24   [provider]  aspirin  EC 81 MG tablet Take 1 tablet (81 mg total) by mouth daily. Swallow whole. Patient taking differently: Take 81 mg by mouth in the morning. Swallow whole. 12/26/23   Bryn Bernardino NOVAK, MD  Blood Glucose Monitoring Suppl DEVI 1 each by Does not apply route in the morning, at noon, and at bedtime. May substitute to any manufacturer covered by patient's insurance. 04/05/24   Rojelio Nest, DO  CRANBERRY PO Take 1 tablet by mouth 2 (two) times daily.    [provider]  feeding supplement (ENSURE PLUS HIGH PROTEIN) LIQD Take 237 mLs by mouth 2 (two) times daily between meals. 05/28/24 06/27/24  Willette Adriana LABOR, MD  ferrous sulfate  325 (65 FE) MG tablet Take 1 tablet (325 mg total) by mouth 2 (two) times daily with a meal. 05/28/24 08/26/24  Shahmehdi, Adriana LABOR, MD  furosemide  (LASIX ) 20 MG tablet Take 20-40 mg by mouth See admin instructions. Take blood pressure and depending on reading take 20mg  or 40mg  by mouth daily.    [provider]  lactulose  (CHRONULAC ) 10 GM/15ML solution  Take 45 mLs (30 g total) by mouth 3 (three) times daily. 03/13/23   Pokhrel, Laxman, MD  levOCARNitine  (CARNITOR ) 330 MG tablet Take 330 mg by mouth 3 (three) times daily. 05/23/22   [provider]  levothyroxine  (SYNTHROID ) 50 MCG tablet Take 1 tablet (50 mcg total) by mouth daily at 6 (six) AM. 05/30/24 07/29/24  Shahmehdi, Adriana LABOR, MD  liver oil-zinc  oxide (DESITIN) 40 % ointment Apply topically daily as needed for irritation. 03/13/23   Pokhrel, Vernal, MD  magnesium  oxide (MAG-OX) 400 MG tablet Take 1 tablet (400 mg total) by mouth daily. Patient taking differently: Take 400 mg by mouth in the morning. 04/05/24   Rojelio Nest, DO  methylPREDNISolone  (MEDROL  DOSEPAK) 4 MG TBPK tablet Medrol  Dosepak: take as instructed on package 05/29/24   Willette Adriana LABOR, MD  midodrine  (PROAMATINE ) 10 MG tablet Take 1 tablet (10 mg total) by mouth 3 (three) times daily with meals. 05/29/24 07/28/24  Shahmehdi, Seyed A, MD  Multiple Vitamin (MULTI-VITAMIN) tablet Take 1 tablet by mouth in the morning.    [provider]  pantoprazole  (PROTONIX ) 40 MG tablet Take 1 tablet (40 mg total) by mouth 2 (two) times daily. 05/28/24 06/27/24  Willette Adriana LABOR, MD  Probiotic Product (PROBIOTIC PEARLS WOMENS) CAPS Take by mouth daily at 6 (six) AM.    [provider]  rifaximin  (XIFAXAN ) 550 MG TABS tablet Take 1 tablet (550 mg total) by mouth 2 (two) times daily. Patient taking differently: Take 550  mg by mouth as needed. Flareups 12/25/23   Bryn Bernardino NOVAK, MD    Allergies: Contrast media [iodinated contrast media]    Review of Systems  Updated Vital Signs BP 118/60   Pulse (!) 53   Temp 97.6 F (36.4 C) (Oral)   Resp 16   Ht 5' 3 (1.6 m)   SpO2 93%   BMI 28.17 kg/m   Physical Exam Vitals and nursing note reviewed.  Constitutional:      General: She is not in acute distress.    Appearance: She is well-developed. She is ill-appearing.  HENT:     Head: Normocephalic and atraumatic.  Eyes:      Conjunctiva/sclera: Conjunctivae normal.  Cardiovascular:     Rate and Rhythm: Normal rate and regular rhythm.  Pulmonary:     Effort: Pulmonary effort is normal. No respiratory distress.     Breath sounds: Normal breath sounds. No stridor.  Abdominal:     General: There is no distension.  Skin:    General: Skin is warm and dry.  Neurological:     Mental Status: She is alert.     Cranial Nerves: No cranial nerve deficit.  Psychiatric:        Mood and Affect: Mood normal.     (all labs ordered are listed, but only abnormal results are displayed) Labs Reviewed  COMPREHENSIVE METABOLIC PANEL WITH GFR - Abnormal; Notable for the following components:      Result Value   Potassium 3.4 (*)    Glucose, Bld 107 (*)    Creatinine, Ser 1.09 (*)    Calcium  8.8 (*)    Total Protein 5.7 (*)    Albumin  2.9 (*)    AST 60 (*)    ALT 48 (*)    Alkaline Phosphatase 153 (*)    Total Bilirubin 1.5 (*)    GFR, Estimated 54 (*)    All other components within normal limits  CBC - Abnormal; Notable for the following components:   WBC 3.4 (*)    RBC 3.36 (*)    Hemoglobin 10.9 (*)    HCT 34.0 (*)    MCV 101.2 (*)    RDW 19.3 (*)    Platelets 122 (*)    All other components within normal limits  AMMONIA - Abnormal; Notable for the following components:   Ammonia 159 (*)    All other components within normal limits  TSH - Abnormal; Notable for the following components:   TSH 16.339 (*)    All other components within normal limits  URINALYSIS, ROUTINE W REFLEX MICROSCOPIC  T4, FREE    EKG: None  Radiology: No results found.   Procedures   Medications Ordered in the ED - No data to display                                  Medical Decision Making Patient with recent hospitalization for encephalopathy, history of TIPS, hepatic dysfunction, thyroid  dysfunction presents with listlessness.  Patient denies focal pain, has not been distress, does have fatigued appearance.  Concern  for encephalopathy versus infection versus electrolyte abnormalities versus medication interaction. Cardiac 55 sinus borderline Pulse ox 95% borderline   Amount and/or Complexity of Data Reviewed Independent Historian: spouse External Data Reviewed: notes. Labs: ordered. Decision-making details documented in ED Course. Discussion of management or test interpretation with external provider(s): I discussed the patient's case with our GI colleagues and  they will follow the consulting service.  Risk Prescription drug management. Decision regarding hospitalization. Diagnosis or treatment significantly limited by social determinants of health.   3:34 PM Patient in similar condition, concern for hepatic encephalopathy with worsening hepatic panel, in spite of increasing lactulose  dosing.  No history of trauma, no fever, no overt precipitant for her worsening condition. I discussed patient's case with her about our gastroenterology colleagues patient will have lactulose  here, be admitted with GI following as a consulting service.     Final diagnoses:  Hepatic encephalopathy [facility])     Garrick Charleston, MD 06/07/24 1535

## 2024-06-08 ENCOUNTER — Ambulatory Visit: Admitting: "Endocrinology

## 2024-06-08 ENCOUNTER — Ambulatory Visit: Admitting: Nurse Practitioner

## 2024-06-08 DIAGNOSIS — K7682 Hepatic encephalopathy: Secondary | ICD-10-CM | POA: Diagnosis not present

## 2024-06-08 DIAGNOSIS — E039 Hypothyroidism, unspecified: Secondary | ICD-10-CM

## 2024-06-08 LAB — COMPREHENSIVE METABOLIC PANEL WITH GFR
ALT: 41 U/L (ref 0–44)
AST: 54 U/L — ABNORMAL HIGH (ref 15–41)
Albumin: 2.5 g/dL — ABNORMAL LOW (ref 3.5–5.0)
Alkaline Phosphatase: 120 U/L (ref 38–126)
Anion gap: 7 (ref 5–15)
BUN: 20 mg/dL (ref 8–23)
CO2: 28 mmol/L (ref 22–32)
Calcium: 8.3 mg/dL — ABNORMAL LOW (ref 8.9–10.3)
Chloride: 105 mmol/L (ref 98–111)
Creatinine, Ser: 0.93 mg/dL (ref 0.44–1.00)
GFR, Estimated: >60 mL/min (ref >60)
Glucose, Bld: 71 mg/dL (ref 70–99)
Potassium: 3.7 mmol/L (ref 3.5–5.1)
Sodium: 140 mmol/L (ref 135–145)
Total Bilirubin: 1.6 mg/dL — ABNORMAL HIGH (ref 0.0–1.2)
Total Protein: 5.2 g/dL — ABNORMAL LOW (ref 6.5–8.1)

## 2024-06-08 LAB — CBC
HCT: 31.2 % — ABNORMAL LOW (ref 36.0–46.0)
Hemoglobin: 10 g/dL — ABNORMAL LOW (ref 12.0–15.0)
MCH: 32.3 pg (ref 26.0–34.0)
MCHC: 32.1 g/dL (ref 30.0–36.0)
MCV: 100.6 fL — ABNORMAL HIGH (ref 80.0–100.0)
Platelets: 102 K/uL — ABNORMAL LOW (ref 150–400)
RBC: 3.1 MIL/uL — ABNORMAL LOW (ref 3.87–5.11)
RDW: 19.3 % — ABNORMAL HIGH (ref 11.5–15.5)
WBC: 2.6 K/uL — ABNORMAL LOW (ref 4.0–10.5)
nRBC: 0 % (ref 0.0–0.2)

## 2024-06-08 LAB — MAGNESIUM: Magnesium: 2 mg/dL (ref 1.7–2.4)

## 2024-06-08 LAB — T3: T3, Total: 52 ng/dL — ABNORMAL LOW (ref 71–180)

## 2024-06-08 LAB — PROTIME-INR
INR: 1.6 — ABNORMAL HIGH (ref 0.8–1.2)
Prothrombin Time: 19.4 s — ABNORMAL HIGH (ref 11.4–15.2)

## 2024-06-08 MED ORDER — ADULT MULTIVITAMIN W/MINERALS CH
1.0000 | ORAL_TABLET | Freq: Every day | ORAL | Status: DC
  Administered 2024-06-09 – 2024-06-10 (×2): 1 via ORAL
  Filled 2024-06-08 (×2): qty 1

## 2024-06-08 MED ORDER — PROSOURCE PLUS PO LIQD
30.0000 mL | Freq: Three times a day (TID) | ORAL | Status: DC
  Administered 2024-06-08 – 2024-06-10 (×5): 30 mL via ORAL
  Filled 2024-06-08 (×6): qty 30

## 2024-06-08 MED ORDER — BOOST / RESOURCE BREEZE PO LIQD CUSTOM
1.0000 | Freq: Three times a day (TID) | ORAL | Status: DC
  Administered 2024-06-08 – 2024-06-10 (×4): 1 via ORAL

## 2024-06-08 NOTE — Evaluation (Signed)
 Physical Therapy Evaluation Patient Details Name: [name] MRN: [id number] DOB: [birth year] Today's Date: 06/08/2024  History of Present Illness  73 yo female presents to therapy following hospital discharge 05/29/24 due to AMS and was found to have hepatic encephalopathy. Pt with two recent hospitalizations for AMS, and acute CVA May 2025. PMH significant for arthritis, colon and cecum ca, hepatic encephalopathy HTN, anemia, liver dz, falls, MASH cirrhosis, variceal hemorrhage s/p TIPS, SIRS, A-fib, hypothyroidism, CVA and AKI.  Clinical Impression  Pt admitted with above diagnosis.  Pt currently with functional limitations due to the deficits listed below (see PT Problem List). Pt will benefit from acute skilled PT to increase their independence and safety with mobility to allow discharge.  Pt observed with slow mentation and activity/mobility however only CGA for safety at this time.  Pt's spouse present for session and reports cognition not at baseline however similar to her other recent episodes of hepatic encephalopathy.  Pt was receiving HHPT prior to this hospitalization.  Recommend OPPT or resuming HHPT (pending transportation availability).        If plan is discharge home, recommend the following: A little help with walking and/or transfers;A little help with bathing/dressing/bathroom;Assistance with feeding;Assist for transportation;Help with stairs or ramp for entrance   Can travel by private vehicle        Equipment Recommendations None recommended by PT  Recommendations for Other Services       Functional Status Assessment Patient has had a recent decline in their functional status and demonstrates the ability to make significant improvements in function in a reasonable and predictable amount of time.     Precautions / Restrictions Precautions Precautions: Fall Recall of Precautions/Restrictions: Impaired      Mobility  Bed Mobility Overal bed mobility: Needs  Assistance Bed Mobility: Sit to Supine       Sit to supine: Contact guard assist, HOB elevated   General bed mobility comments: Increased time    Transfers Overall transfer level: Needs assistance Equipment used: Rolling walker (2 wheels) Transfers: Sit to/from Stand Sit to Stand: Contact guard assist           General transfer comment: verbal cues for positioning    Ambulation/Gait Ambulation/Gait assistance: Contact guard assist Gait Distance (Feet): 140 Feet Assistive device: Rolling walker (2 wheels) Gait Pattern/deviations: Step-through pattern, Decreased stride length Gait velocity: decreased     General Gait Details: slight trunk flexion, slow but steady with RW, distance per tolerance  Stairs            Wheelchair Mobility     Tilt Bed    Modified Rankin (Stroke Patients Only)       Balance Overall balance assessment: Needs assistance         Standing balance support: No upper extremity supported Standing balance-Leahy Scale: Fair Standing balance comment: static at least fair                             Pertinent Vitals/Pain Pain Assessment Pain Assessment: No/denies pain    Home Living Family/patient expects to be discharged to:: Private residence Living Arrangements: Spouse/significant other Available Help at Discharge: Family;Available 24 hours/day Type of Home: House Home Access: Ramped entrance       Home Layout: One level Home Equipment: Rolling Walker (2 wheels);BSC/3in1;Cane - single point;Wheelchair - manual;Hospital bed;Tub bench Additional Comments: walker basket. Recent bouts of hepatic encephalopathy.    Prior Function Prior Level of Function :  Independent/Modified Independent;Needs assist  Cognitive Assist : ADLs (cognitive)           Mobility Comments: mod I with intermittent use SPC for household and community navigation ADLs Comments: varies depending on cognition; typically pt is MOD I, spouse  manages medications and bills     Extremity/Trunk Assessment   Upper Extremity Assessment Upper Extremity Assessment: Generalized weakness;Right hand dominant    Lower Extremity Assessment Lower Extremity Assessment: Generalized weakness       Communication   Communication Communication: Impaired Factors Affecting Communication: Difficulty expressing self    Cognition Arousal: Alert Behavior During Therapy: WFL for tasks assessed/performed                           PT - Cognition Comments: cognition not at baseline per spouse however presenting similar to other episodes of hepatic encephalopathy Following commands: Intact       Cueing Cueing Techniques: Verbal cues     General Comments General comments (skin integrity, edema, etc.): Spouse present throughout    Exercises     Assessment/Plan    PT Assessment Patient needs continued PT services  PT Problem List Decreased strength;Decreased activity tolerance;Decreased balance;Decreased mobility       PT Treatment Interventions DME instruction;Gait training;Functional mobility training;Therapeutic exercise;Therapeutic activities;Balance training;Neuromuscular re-education;Patient/family education    PT Goals (Current goals can be found in the Care Plan section)  Acute Rehab PT Goals PT Goal Formulation: With patient/family Time For Goal Achievement: 06/22/24 Potential to Achieve Goals: Good    Frequency Min 2X/week     Co-evaluation               AM-PAC PT 6 Clicks Mobility  Outcome Measure Help needed turning from your back to your side while in a flat bed without using bedrails?: A Little Help needed moving from lying on your back to sitting on the side of a flat bed without using bedrails?: A Little Help needed moving to and from a bed to a chair (including a wheelchair)?: A Little Help needed standing up from a chair using your arms (e.g., wheelchair or bedside chair)?: A Little Help  needed to walk in hospital room?: A Little Help needed climbing 3-5 steps with a railing? : A Little 6 Click Score: 18    End of Session Equipment Utilized During Treatment: Gait belt Activity Tolerance: Patient tolerated treatment well Patient left: in bed;with call bell/phone within reach;with family/visitor present Nurse Communication: Mobility status PT Visit Diagnosis: Difficulty in walking, not elsewhere classified (R26.2);Muscle weakness (generalized) (M62.81)    Time: 8874-8857 PT Time Calculation (min) (ACUTE ONLY): 17 min   Charges:   PT Evaluation $PT Eval Low Complexity: 1 Low   PT General Charges $$ ACUTE PT VISIT: 1 Visit        Tari PT, DPT Physical Therapist Acute Rehabilitation Services Office: [phone]   Tari CROME Payson 06/08/2024, 2:21 PM

## 2024-06-08 NOTE — Progress Notes (Addendum)
 [place] [facility] Progress Note  CC:  HE   Subjective:  Never got the lactulose  enema.  Did have a large BM in the ED last night and had one early this AM.  Objective:  Vital signs in last 24 hours: Temp:  [97.2 F (36.2 C)-98.9 F (37.2 C)] 98.3 F (36.8 C) (07/17 0822) Pulse Rate:  [51-65] 64 (07/17 0822) Resp:  [16-20] 20 (07/17 0822) BP: (106-141)/(58-82) 119/58 (07/17 0822) SpO2:  [91 %-98 %] 95 % (07/17 0822) Weight:  [71.4 kg-71.7 kg] 71.7 kg (07/17 0431) Last BM Date : 06/07/24 General:  Alert, chronically ill-appearing, pleasant cooperative in NAD; tearful Heart:  Regular rate and rhythm; no murmurs Pulm:  CTAB.  No W/R/R. Abdomen:  Soft, non-distended.  BS present.  Non-tender. Extremities:  B/L LE edema noted.  Intake/Output from previous day: 07/16 0701 - 07/17 0700 In: 682.7 [P.O.:277; IV Piggyback:405.7] Out: -   Lab Results: Recent Labs    06/07/24 1340 06/08/24 0522  WBC 3.4* 2.6*  HGB 10.9* 10.0*  HCT 34.0* 31.2*  PLT 122* 102*   BMET Recent Labs    06/07/24 1340 06/08/24 0522  NA 140 140  K 3.4* 3.7  CL 100 105  CO2 32 28  GLUCOSE 107* 71  BUN 22 20  CREATININE 1.09* 0.93  CALCIUM  8.8* 8.3*   LFT Recent Labs    06/08/24 0522  PROT 5.2*  ALBUMIN  2.5*  AST 54*  ALT 41  ALKPHOS 120  BILITOT 1.6*   PT/INR Recent Labs    06/08/24 0522  LABPROT 19.4*  INR 1.6*   US  ASCITES (ABDOMEN LIMITED) Result Date: 06/07/2024 EXAM: LIMITED ABDOMINAL ULTRASOUND FOR ASCITES EVALUATION TECHNIQUE: Limited real-time sonography of all 4 quadrants of the abdomen was performed for evaluation of ascites. COMPARISON: None. CLINICAL HISTORY: 356290 Ascites [id number]. Ascites. FINDINGS: RIGHT UPPER QUADRANT: No ascites seen. Incidental note of cholelithiasis. LEFT UPPER QUADRANT: No ascites seen. RIGHT LOWER QUADRANT: No ascites seen. LEFT LOWER QUADRANT: No ascites seen. OTHER: Limited visualization of the rest of the abdomen demonstrates no  acute abnormality. IMPRESSION: 1. No significant ascites. 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   *HE:  Ammonia level 159 here and increasing confusion at home despite increasing lactulose  dosing.  Hemoglobin is improving.  No other overt signs of infection to precipitate.  Husband wondering if it is still recovery from her ulcer seen on endoscopy last week could still be causing issues her ammonia/HE (had non-bleeding gastric ulcers on EGD 7/6).  Ultrasound with doppler ok 7/4.  Ultrasound this admission without ascites.  ? If thyroid  issues/level affecting this,   *Embolic CVA 11/2023: Was initially on Plavix  for a month, now on a baby aspirin .   *History of adenocarcinoma of the cecum: Stage IIIb, T3 N1 with lymph node mass measuring 4 cm, status post hemicolectomy and adjuvant FOLFOX x 12 cycles from 3/15-11/11/2002  -Thiamine  level pending. -Will focus on getting 2-3 BMs daily.  Continue lactulose  30 grams four times daily and Miralax  daily.  Continue Xifaxan  550 mg BID as well.   LOS: 1 day   [name]. Zehr  06/08/2024, 12:06 PM      [facility] Attending   I have taken an interval  history, reviewed the chart and examined the patient. I agree with the Advanced Practitioner's note, impression and recommendations with the following additions:  Mental status improved today. Husband indicated he was not aware that 4 times daily lactulose  dosing was recommended at last liver clinic visit.  Plan is to have lactulose  4 times daily.  Question adherence at home.  Husband says she refuses sometimes.  Try to promote better defecation.  We have added daily MiraLAX .   Patient was only using that as needed at home.  This is an unusual situation, we cannot find other precipitants for encephalopathy so have to think nonadherence to medication regimen is part of the problem.  Await thiamine  level  I wonder about increasing Synthroid  to 75 mcg.  I know that is not the primary problem but hypothyroidism could be part of her issues more recently.  Though I am not in the habit of routinely checking ammonia levels I think it is worthwhile looking at the level again tomorrow.  I have ordered that.  Follow-up tomorrow.  Addendum I did contact Stephane Quest, NP from [facility].  She is puzzled by the constipation issues and the patient is prescribed lactulose  as this 1 is.  May need to consider structural evaluation of the colon given constipation but the husband reports large formed bowel movements when she does have them.  Will revisit tomorrow.  KUB CT scanning in the last several months have not shown any suggestion of obstruction.  Rectal exam can be performed depending upon clinical course.  Lupita CHARLENA Commander, MD, [facility] See [name] on call - gastroenterology for best contact person 06/08/2024 4:25 PM

## 2024-06-08 NOTE — TOC Initial Note (Addendum)
 Transition of Care [facility]) - Initial/Assessment Note    Patient Details  Name: [name] MRN: [id number] Date of Birth: [birth year]  [facility] CM/SW Contact:    Bascom Service, RN Phone Number: 06/08/2024, 11:54 AM  Clinical Narrative:Spoke to [name](spouse) d/c plan home. Prefer to go to outpatient rehab on [address]-will await PT recc. Has health insurance therefore unable to provide med asst. Has own transport home.               -3:42p-otpt PT referral sent per spouse request.   Expected Discharge Plan: OP Rehab Barriers to Discharge: Continued Medical Work up   Patient Goals and CMS Choice Patient states their goals for this hospitalization and ongoing recovery are:: Home CMS Medicare.gov Compare Post Acute Care list provided to:: Patient Represenative (must comment) Choice offered to / list presented to : Spouse [place] ownership interest in [facility].provided to:: Spouse    Expected Discharge Plan and Services   Discharge Planning Services: CM Consult Post Acute Care Choice: Durable Medical Equipment (rw,cane) Living arrangements for the past 2 months: Single Family Home                                      Prior Living Arrangements/Services Living arrangements for the past 2 months: Single Family Home Lives with:: Spouse   Do you feel safe going back to the place where you live?: Yes               Activities of Daily Living   ADL Screening (condition at time of admission) Independently performs ADLs?: Yes (appropriate for developmental age) Is the patient deaf or have difficulty hearing?: No Does the patient have difficulty seeing, even when wearing glasses/contacts?: No Does the patient have difficulty concentrating, remembering, or making decisions?: No  Permission Sought/Granted Permission sought to share information with : Case Manager                Emotional Assessment              Admission  diagnosis:  Hepatic encephalopathy (HCC) [K76.82] Patient Active Problem List   Diagnosis Date Noted   Heme positive stool 05/28/2024   Acute gastric ulcer without hemorrhage or perforation 05/28/2024   Gastritis and gastroduodenitis 05/28/2024   Acute on chronic anemia 05/27/2024   SIRS (systemic inflammatory response syndrome) (HCC) 05/21/2024   Acute prerenal azotemia 05/21/2024   Paroxysmal atrial fibrillation (HCC) 05/21/2024   Acute metabolic encephalopathy 05/20/2024   Vision disturbance 05/11/2024   Patent foramen ovale 04/16/2024   Acute CVA (cerebrovascular accident) (HCC) 04/15/2024   Abnormal CBC 04/10/2024   Hyperkalemia 04/05/2024   Hypoglycemia 04/05/2024   Macrocytic anemia 04/05/2024   History of stroke 01/06/2024   Thrombocytopenia (HCC) 12/21/2023   Decreased GFR 12/10/2023   Preventative health care 09/09/2023   Lower extremity edema 08/10/2023   Cellulitis of both lower extremities 08/04/2023   Altered mental status 08/04/2023   Bradycardia 05/07/2023   Protein-calorie malnutrition, severe (HCC) 02/26/2023   Weight loss 02/24/2023   Other constipation 02/24/2023   Generalized abdominal pain 02/24/2023   Hospital discharge follow-up 02/24/2023   Weakness 01/04/2023   Frequent UTI 05/18/2022   Anemia of chronic disease 05/18/2022   AKI (acute kidney injury) (HCC) 04/30/2022   Tachycardia-bradycardia syndrome (HCC) 02/05/2022   Hypomagnesemia 02/04/2022   Hepatic encephalopathy (HCC) 02/03/2022   Basal cell  carcinoma (BCC) 05/06/2021   Hypertension 05/06/2021   Cirrhosis (HCC) 12/04/2020   AMS (altered mental status) 11/28/2020   Murmur, cardiac 03/19/2020   Acquired hypothyroidism 06/30/2019   History of basal cell cancer 06/30/2019   Hx of colon cancer, stage III 11/30/2011   Portal hypertension (HCC) 01/23/2008   PCP:  [name], NP Pharmacy:   [facility] - [place], [place] - [address] [place] [address] Phone: [phone] Fax: [phone]     Social Drivers of Health (SDOH) Social History: SDOH Screenings   Food Insecurity: No Food Insecurity (06/07/2024)  Housing: Low Risk  (05/21/2024)  Transportation Needs: No Transportation Needs (05/21/2024)  Utilities: Not At Risk (05/21/2024)  Alcohol  Screen: Low Risk  (03/27/2024)  Depression (PHQ2-9): Low Risk  (05/11/2024)  Financial Resource Strain: Low Risk  (05/10/2024)  Physical Activity: Insufficiently Active (05/10/2024)  Social Connections: Socially Integrated (06/07/2024)  Recent Concern: Social Connections - Moderately Isolated (03/30/2024)  Stress: No Stress Concern Present (05/10/2024)  Tobacco Use: Low Risk  (06/07/2024)   SDOH Interventions:     Readmission Risk Interventions    05/29/2024   11:06 AM 05/22/2024    3:58 PM 03/31/2024    2:51 PM  Readmission Risk Prevention Plan  Transportation Screening Complete Complete Complete  PCP or Specialist Appt within 3-5 Days   Complete  HRI or Home Care Consult   Complete  Social Work Consult for Recovery Care Planning/Counseling   Complete  Palliative Care Screening   Not Applicable  Medication Review Oceanographer) Complete Complete Complete  PCP or Specialist appointment within 3-5 days of discharge Complete    HRI or Home Care Consult Complete Complete   SW Recovery Care/Counseling Consult Complete Complete   Palliative Care Screening Not Applicable Not Applicable   Skilled Nursing Facility Not Applicable Not Applicable

## 2024-06-08 NOTE — Evaluation (Signed)
 Occupational Therapy Evaluation Patient Details Name: [name] MRN: [id number] DOB: Oct 18, 1951 Today's Date: 06/08/2024   History of Present Illness   73 yo female presents to therapy following hospital discharge 05/29/24 due to AMS and was found to have hepatic encephalopathy. Pt with two recent hospitalizations for AMS, and acute CVA May 2025. PMH significant for arthritis, colon and cecum ca, hepatic encephalopathy HTN, anemia, liver dz, falls, MASH cirrhosis, variceal hemorrhage s/p TIPS, SIRS, A-fib, hypothyroidism, CVA and AKI.     Clinical Impressions Pt admitted with above diagnosis. Pt alert to self and general situation; spouse present and assists with verifying PLOF/home setup. Per spouse, pt is not at her cognitive baseline, her cognition and overall mentation tends to fluctuate when she is hospitalized with encephalopathy.   Pt is able to perform all tasks with increased time and min vcs for sequencing. Requires CGA for bed mobility and CGA-MIN A for functional STS transfers using RW from bed, step pivot to sit on bench with spouse for 15+ minutes, t/f bathroom using RW with up to MIN A for clothing management + hygiene tasks. Handoff to PT end of session. Pt would benefit from skilled OT services to address noted impairments and functional limitations (see below for any additional details) in order to maximize safety and independence while minimizing falls risk and caregiver burden. Anticipate the need for follow up [facility] OT services upon acute hospital DC.      If plan is discharge home, recommend the following:   A little help with walking and/or transfers;A little help with bathing/dressing/bathroom;Direct supervision/assist for financial management;Supervision due to cognitive status;Assist for transportation;Assistance with feeding;Direct supervision/assist for medications management;Help with stairs or ramp for entrance     Functional Status Assessment   Patient has had  a recent decline in their functional status and demonstrates the ability to make significant improvements in function in a reasonable and predictable amount of time.     Equipment Recommendations   None recommended by OT (has all necessary DME)      Precautions/Restrictions   Precautions Precautions: Fall Recall of Precautions/Restrictions: Impaired Restrictions Weight Bearing Restrictions Per Provider Order: No     Mobility Bed Mobility Overal bed mobility: Needs Assistance Bed Mobility: Supine to Sit     Supine to sit: Contact guard     General bed mobility comments: Increased time    Transfers Overall transfer level: Needs assistance Equipment used: Rolling walker (2 wheels) Transfers: Sit to/from Stand Sit to Stand: Contact guard assist                  Balance Overall balance assessment: Needs assistance Sitting-balance support: Bilateral upper extremity supported Sitting balance-Leahy Scale: Good Sitting balance - Comments: posterior lean with fatigue, corrects with cues Postural control: Posterior lean Standing balance support: No upper extremity supported, Bilateral upper extremity supported, Reliant on assistive device for balance Standing balance-Leahy Scale: Good Standing balance comment: steady without UE support statically                           ADL either performed or assessed with clinical judgement   ADL Overall ADL's : Needs assistance/impaired Eating/Feeding: Sitting;Minimal assistance Eating/Feeding Details (indicate cue type and reason): spouse states he has been feeding pt Grooming: Wash/dry hands;Sitting;Wash/dry face Grooming Details (indicate cue type and reason): sitting EOB Upper Body Bathing: Minimal assistance;Sitting   Lower Body Bathing: Moderate assistance;Sit to/from stand   Upper Body Dressing : Minimal  assistance;Sitting   Lower Body Dressing: Minimal assistance;Sit to/from stand   Toilet Transfer:  Minimal assistance;Grab bars;Regular Toilet;Cueing for sequencing;Cueing for Chief of Staff Details (indicate cue type and reason): cues for sequencing and overall awareness, pt requires increased time for all task performance Toileting- Clothing Manipulation and Hygiene: Sit to/from stand;Minimal assistance;Cueing for safety;Cueing for sequencing Toileting - Clothing Manipulation Details (indicate cue type and reason): minA due to fatigue, pt initiates takes but has very slow effortful movements     Functional mobility during ADLs: Minimal assistance;Contact guard assist;Rolling walker (2 wheels);Cueing for sequencing General ADL Comments: increased time needed      Pertinent Vitals/Pain Pain Assessment Pain Assessment: No/denies pain     Extremity/Trunk Assessment Upper Extremity Assessment Upper Extremity Assessment: Generalized weakness;Right hand dominant   Lower Extremity Assessment Lower Extremity Assessment: Generalized weakness       Communication Communication Communication: Impaired Factors Affecting Communication: Difficulty expressing self   Cognition Arousal: Alert Behavior During Therapy: WFL for tasks assessed/performed Cognition: Cognition impaired     Awareness: Intellectual awareness impaired Memory impairment (select all impairments): Short-term memory, Working memory Attention impairment (select first level of impairment): Sustained attention Executive functioning impairment (select all impairments): Initiation, Organization, Sequencing, Reasoning, Problem solving OT - Cognition Comments: pt requires increased time for all tasks, slow processing. pt is not at normal cognitive baseline per spouse (this is typical for her encephalolathic presentation while hospitalized, she flucuates with mental status)                 Following commands: Intact       Cueing  General Comments   Cueing Techniques: Verbal cues  Spouse present  throughout           Home Living Family/patient expects to be discharged to:: Private residence Living Arrangements: Spouse/significant other Available Help at Discharge: Family;Available 24 hours/day Type of Home: House Home Access: Ramped entrance     Home Layout: One level     Bathroom Shower/Tub: Tub/shower unit;Curtain   Bathroom Toilet: Standard Bathroom Accessibility: Yes   Home Equipment: Agricultural consultant (2 wheels);BSC/3in1;Cane - single point;Wheelchair - manual;Hospital bed;Tub bench   Additional Comments: walker basket. Recent bouts of hepatic encephalopathy.      Prior Functioning/Environment Prior Level of Function : Independent/Modified Independent;Needs assist  Cognitive Assist : ADLs (cognitive)           Mobility Comments: mod I with intermittent use SPC for household and community navigation ADLs Comments: varies depending on cognition; typically pt is MOD I, spouse manages medications and bills    OT Problem List: Decreased strength;Decreased knowledge of use of DME or AE;Decreased coordination;Decreased cognition;Decreased activity tolerance;Impaired balance (sitting and/or standing);Decreased safety awareness   OT Treatment/Interventions: Self-care/ADL training;Therapeutic exercise;Patient/family education;Neuromuscular education;Balance training;Energy conservation;Therapeutic activities;DME and/or AE instruction;Cognitive remediation/compensation      OT Goals(Current goals can be found in the care plan section)   Acute Rehab OT Goals OT Goal Formulation: With patient/family Time For Goal Achievement: 06/22/24 Potential to Achieve Goals: Good ADL Goals Pt Will Perform Grooming: with supervision;standing Pt Will Perform Upper Body Dressing: with supervision;sitting Pt Will Perform Lower Body Dressing: with supervision;sit to/from stand;sitting/lateral leans Pt Will Transfer to Toilet: with supervision;ambulating;grab bars Pt Will Perform  Toileting - Clothing Manipulation and hygiene: with supervision;sitting/lateral leans;sit to/from stand   OT Frequency:  Min 2X/week       AM-PAC OT 6 Clicks Daily Activity     Outcome Measure Help from another person eating meals?: A Little Help from  another person taking care of personal grooming?: A Little Help from another person toileting, which includes using toliet, bedpan, or urinal?: A Lot Help from another person bathing (including washing, rinsing, drying)?: A Lot Help from another person to put on and taking off regular upper body clothing?: A Little Help from another person to put on and taking off regular lower body clothing?: A Lot 6 Click Score: 15   End of Session Equipment Utilized During Treatment: Gait belt;Rolling walker (2 wheels) Nurse Communication: Mobility status  Activity Tolerance: Patient tolerated treatment well Patient left: in bed;with family/visitor present (handoff to PT)  OT Visit Diagnosis: Unsteadiness on feet (R26.81);Muscle weakness (generalized) (M62.81);Other symptoms and signs involving cognitive function                Time: 8958-8871 OT Time Calculation (min): 47 min Charges:  OT General Charges $OT Visit: 1 Visit OT Evaluation $OT Eval Moderate Complexity: 1 Mod OT Treatments $Self Care/Home Management : 23-37 mins Grason Brailsford L. Sally Reimers, OTR/L  06/08/24, 11:57 AM

## 2024-06-08 NOTE — Progress Notes (Signed)
 Initial Nutrition Assessment  DOCUMENTATION CODES:   Not applicable  INTERVENTION:   -MVI with minerals daily -D/c Ensure Plus High Protein po BID, each supplement provides 350 kcal and 20 grams of protein  -Boost Breeze po TID, each supplement provides 250 kcal and 9 grams of protein  -30 ml Prosource Plus TID, each supplement provides 100 kcals and 15 grams protein -Liberalize diet to 2 gram sodium for wider variety of meal selections   NUTRITION DIAGNOSIS:   Increased nutrient needs related to acute illness (hepatic encephalopathy) as evidenced by estimated needs.  GOAL:   Patient will meet greater than or equal to 90% of their needs  MONITOR:   PO intake, Supplement acceptance  REASON FOR ASSESSMENT:   Consult Assessment of nutrition requirement/status, Other (Comment) (hepatic encepahlopathy)  ASSESSMENT:   Pt with past medical history of hypothyroidism, esophageal varices, colon cancer, history of hepatic encephalopathy presented to the hospital with altered mental status.  Pt admitted with hepatic encephalopathy.   Reviewed I/O's: +683 ml x 24 hours  Pt unavailable at time of visit. Attempted to speak with pt via call to hospital room phone, however, unable to reach. RD unable to obtain further nutrition-related history or complete nutrition-focused physical exam at this time.    Case discussed with RN; pt consumed 100% of her breakfast (fruit and yogurt). Pt does not like Ensure and refused supplement. Per prior RD notes, pt does not like [employer], but has a preference for [employer] and Prosource.   Per GI notes, pt with recurrent confusion. Pt takes 60 grams lactulose  PTA, however, pt has one bowel movement daily at best. Lactulose  enemas tend to work better for her.   GI checking thiamine  level, lactulose , and lactulose  enema.   No wt loss noted over the past 2 months. Per nursing assessment, pt with with mild pitting edema, which may be  masking true weight loss as well as fat and muscle depletions. Pt has history of severe malnutrition in acute illness per prior hospitalization; suspect this may be ongoing, however, unable to identify at this time.   Medications reviewed and include lactulose , liquid MVI, miralax , and florastor.   Labs reviewed: CBGS: 103 (inpatient orders for glycemic control are none).    Diet Order:   Diet Order             Diet Heart Room service appropriate? Yes; Fluid consistency: Thin  Diet effective now                   EDUCATION NEEDS:   No education needs have been identified at this time  Skin:  Skin Assessment: Reviewed RN Assessment  Last BM:  06/07/24  Height:   Ht Readings from Last 1 Encounters:  06/07/24 5' 3 (1.6 m)    Weight:   Wt Readings from Last 1 Encounters:  06/08/24 71.7 kg    Ideal Body Weight:  52.3 kg  BMI:  Body mass index is 27.99 kg/m.  Estimated Nutritional Needs:   Kcal:  1650-1850  Protein:  85-100 grams  Fluid:  1.6-1.8 L    Margery ORN, RD, LDN, CDCES Registered Dietitian III Certified Diabetes Care and Education Specialist If unable to reach this RD, please use RD Inpatient group chat on secure chat between hours of 8am-4 pm daily

## 2024-06-08 NOTE — Progress Notes (Deleted)
 Acute Office Visit  Subjective:     Patient ID: [name], female    DOB: 07-08-1951, 73 y.o.   MRN: [id number]  No chief complaint on file.   HPI Patient is in today for hospital follow-up    ROS      Objective:    There were no vitals taken for this visit. {Vitals History (Optional):23777}  Physical Exam  Results for orders placed or performed during the hospital encounter of 06/07/24  Comprehensive metabolic panel  Result Value Ref Range   Sodium 140 135 - 145 mmol/L   Potassium 3.4 (L) 3.5 - 5.1 mmol/L   Chloride 100 98 - 111 mmol/L   CO2 32 22 - 32 mmol/L   Glucose, Bld 107 (H) 70 - 99 mg/dL   BUN 22 8 - 23 mg/dL   Creatinine, Ser 8.90 (H) 0.44 - 1.00 mg/dL   Calcium  8.8 (L) 8.9 - 10.3 mg/dL   Total Protein 5.7 (L) 6.5 - 8.1 g/dL   Albumin  2.9 (L) 3.5 - 5.0 g/dL   AST 60 (H) 15 - 41 U/L   ALT 48 (H) 0 - 44 U/L   Alkaline Phosphatase 153 (H) 38 - 126 U/L   Total Bilirubin 1.5 (H) 0.0 - 1.2 mg/dL   GFR, Estimated 54 (L) >60 mL/min   Anion gap 8 5 - 15  CBC  Result Value Ref Range   WBC 3.4 (L) 4.0 - 10.5 K/uL   RBC 3.36 (L) 3.87 - 5.11 MIL/uL   Hemoglobin 10.9 (L) 12.0 - 15.0 g/dL   HCT 65.9 (L) 63.9 - 53.9 %   MCV 101.2 (H) 80.0 - 100.0 fL   MCH 32.4 26.0 - 34.0 pg   MCHC 32.1 30.0 - 36.0 g/dL   RDW 80.6 (H) 88.4 - 84.4 %   Platelets 122 (L) 150 - 400 K/uL   nRBC 0.0 0.0 - 0.2 %  Ammonia  Result Value Ref Range   Ammonia 159 (H) 9 - 35 umol/L  TSH  Result Value Ref Range   TSH 16.339 (H) 0.350 - 4.500 uIU/mL  T4, free  Result Value Ref Range   Free T4 0.79 0.61 - 1.12 ng/dL  Comprehensive metabolic panel  Result Value Ref Range   Sodium 140 135 - 145 mmol/L   Potassium 3.7 3.5 - 5.1 mmol/L   Chloride 105 98 - 111 mmol/L   CO2 28 22 - 32 mmol/L   Glucose, Bld 71 70 - 99 mg/dL   BUN 20 8 - 23 mg/dL   Creatinine, Ser 9.06 0.44 - 1.00 mg/dL   Calcium  8.3 (L) 8.9 - 10.3 mg/dL   Total Protein 5.2 (L) 6.5 - 8.1 g/dL   Albumin  2.5 (L)  3.5 - 5.0 g/dL   AST 54 (H) 15 - 41 U/L   ALT 41 0 - 44 U/L   Alkaline Phosphatase 120 38 - 126 U/L   Total Bilirubin 1.6 (H) 0.0 - 1.2 mg/dL   GFR, Estimated >39 >39 mL/min   Anion gap 7 5 - 15  CBC  Result Value Ref Range   WBC 2.6 (L) 4.0 - 10.5 K/uL   RBC 3.10 (L) 3.87 - 5.11 MIL/uL   Hemoglobin 10.0 (L) 12.0 - 15.0 g/dL   HCT 68.7 (L) 63.9 - 53.9 %   MCV 100.6 (H) 80.0 - 100.0 fL   MCH 32.3 26.0 - 34.0 pg   MCHC 32.1 30.0 - 36.0 g/dL   RDW 80.6 (H) 88.4 -  15.5 %   Platelets 102 (L) 150 - 400 K/uL   nRBC 0.0 0.0 - 0.2 %  Protime-INR  Result Value Ref Range   Prothrombin Time 19.4 (H) 11.4 - 15.2 seconds   INR 1.6 (H) 0.8 - 1.2  Magnesium   Result Value Ref Range   Magnesium  2.0 1.7 - 2.4 mg/dL        Assessment & Plan:   Problem List Items Addressed This Visit   None   No orders of the defined types were placed in this encounter.   No follow-ups on file.  [name], NP

## 2024-06-08 NOTE — Progress Notes (Addendum)
 [facility] Progress Note  Patient: [name]     FMW:[id number]  DOA: 06/07/2024   PCP: [name], NP       Brief hospital course: 73 year old female with history of colon cancer, hepatic encephalopathy, esophageal varices and hypothyroidism who presents to the hospital for altered mental status.  She was recently admitted for the same and was discharged home on lactulose .  She was also prescribed Xifaxan  but her husband states that it is cross prohibitive and he has not been giving it to her regularly.  Subjective:  Continues to have on and off confusion according to the husband.  Apparently was extremely oriented last night and had dinner but today he states she is not able to feed herself.  Assessment and Plan: Principal Problem:   Hepatic encephalopathy -Recently was admitted for confusion and had neurowork-up which did not reveal any other cause for her confusion - Confusion is likely secondary to not taking lactulose  and Xifaxan  regularly-these medications have been reordered - Lactulose  enema was ordered yesterday but patient did not take this - Per husband the patient at times refuses to take lactulose  but as of today, it appears that she is tolerating all of her medications and food - Appreciate management per GI  Active Problems:   Suspected cirrhosis elevated LFTs and history of esophageal varices - History of variceal hemorrhage in 2012 - Status post TIPS -Follows at [facility] - Ultrasound abdomen ordered by GI does not show any ascites  Peptic ulcer disease - EGD performed on 7/6 reveals multiple small gastric ulcers - Continue Protonix   Iron deficiency anemia - Continue oral iron supplements - B12 and folate levels  checked previously and was found to be normal  Pancytopenia 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 status post FOLFOX and hemicolectomy - outpt follow up with hematology    Acquired hypothyroidism -  TSH quite elevated at 28.38-possibly due to noncompliance with Synthroid  -Have resumed Synthroid -Per RN patient is taking her medications appropriately   Lower extremity edema - Likely secondary to venous stasis - Vascular ultrasound was performed and May and was negative for DVT     Code Status: Full Code Total time on patient care: 35 minutes DVT prophylaxis:  enoxaparin  (LOVENOX ) injection 40 mg Start: 06/07/24 2200 SCDs Start: 06/07/24 1646     Objective:   Vitals:   06/07/24 2313 06/08/24 0033 06/08/24 0431 06/08/24 0822  BP:  128/67 116/67 (!) 119/58  Pulse:  (!) 59 65 64  Resp:    20  Temp: (!) 97.2 F (36.2 C) 97.8 F (36.6 C) 98.9 F (37.2 C) 98.3 F (36.8 C)  TempSrc: Oral Oral Oral Oral  SpO2:  96% 96% 95%  Weight: 71.4 kg  71.7 kg   Height: 5' 3 (1.6 m)      Filed Weights   06/07/24 2313 06/08/24 0431  Weight: 71.4 kg 71.7 kg   Exam: General exam: Appears comfortable-confused as to why she is in the hospital HEENT: oral mucosa moist Respiratory system: Clear to auscultation.  Cardiovascular system: S1 & S2 heard  Gastrointestinal system: Abdomen soft, non-tender, nondistended. Normal bowel sounds   Extremities: No cyanosis, clubbing-has mild pedal edema  CBC: Recent Labs  Lab 06/07/24 1340 06/08/24 0522  WBC 3.4* 2.6*  HGB 10.9* 10.0*  HCT 34.0* 31.2*  MCV 101.2* 100.6*  PLT 122* 102*   Basic Metabolic Panel: Recent Labs  Lab 06/07/24 1340 06/08/24 0522  NA 140 140  K 3.4* 3.7  CL 100 105  CO2 32 28  GLUCOSE 107* 71  BUN 22 20  CREATININE 1.09* 0.93  CALCIUM  8.8* 8.3*  MG  --  2.0     Scheduled Meds:  (feeding supplement) PROSource Plus  30 mL Oral TID BM   aspirin  EC  81 mg Oral Daily   enoxaparin  (LOVENOX ) injection  40 mg Subcutaneous Q24H   feeding supplement  1 Container Oral TID BM   lactulose   30 g Oral QID   lactulose   300 mL Rectal Once   levothyroxine   50 mcg Oral Q0600   magnesium  oxide  400 mg Oral Daily    midodrine   10 mg Oral TID WC   multivitamin with minerals  1 tablet Oral Daily   pantoprazole   40 mg Oral BID   polyethylene glycol  17 g Oral Daily   rifaximin   550 mg Oral BID   saccharomyces boulardii  250 mg Oral Q0600   sodium chloride  flush  3 mL Intravenous Q12H    Imaging and lab data personally reviewed   Author: Ellaina Schuler  06/08/2024 2:13 PM  To contact [facility]>   Check the care team in [facility] and look for the attending/consulting [facility] provider listed  Log into [email or web address] and use 's universal password   Go to> [facility]  and find provider  If you still have difficulty reaching the provider, please page the [facility] (Director on Call) for the Hospitalists listed on amion

## 2024-06-09 ENCOUNTER — Ambulatory Visit

## 2024-06-09 DIAGNOSIS — K7682 Hepatic encephalopathy: Secondary | ICD-10-CM | POA: Diagnosis not present

## 2024-06-09 DIAGNOSIS — K5909 Other constipation: Secondary | ICD-10-CM

## 2024-06-09 LAB — CBC WITH DIFFERENTIAL/PLATELET
Abs Immature Granulocytes: 0.01 K/uL (ref 0.00–0.07)
Basophils Absolute: 0 K/uL (ref 0.0–0.1)
Basophils Relative: 1 %
Eosinophils Absolute: 0.1 K/uL (ref 0.0–0.5)
Eosinophils Relative: 5 %
HCT: 34.2 % — ABNORMAL LOW (ref 36.0–46.0)
Hemoglobin: 10.5 g/dL — ABNORMAL LOW (ref 12.0–15.0)
Immature Granulocytes: 0 %
Lymphocytes Relative: 25 %
Lymphs Abs: 0.8 K/uL (ref 0.7–4.0)
MCH: 31.8 pg (ref 26.0–34.0)
MCHC: 30.7 g/dL (ref 30.0–36.0)
MCV: 103.6 fL — ABNORMAL HIGH (ref 80.0–100.0)
Monocytes Absolute: 0.3 K/uL (ref 0.1–1.0)
Monocytes Relative: 9 %
Neutro Abs: 1.9 K/uL (ref 1.7–7.7)
Neutrophils Relative %: 60 %
Platelets: 103 K/uL — ABNORMAL LOW (ref 150–400)
RBC: 3.3 MIL/uL — ABNORMAL LOW (ref 3.87–5.11)
RDW: 19.7 % — ABNORMAL HIGH (ref 11.5–15.5)
WBC: 3.1 K/uL — ABNORMAL LOW (ref 4.0–10.5)
nRBC: 0 % (ref 0.0–0.2)

## 2024-06-09 LAB — TECHNOLOGIST SMEAR REVIEW: Plt Morphology: NORMAL

## 2024-06-09 LAB — AMMONIA: Ammonia: 90 umol/L — ABNORMAL HIGH (ref 9–35)

## 2024-06-09 LAB — T3, FREE: T3, Free: 1.3 pg/mL — ABNORMAL LOW (ref 2.0–4.4)

## 2024-06-09 MED ORDER — LEVOTHYROXINE SODIUM 75 MCG PO TABS
75.0000 ug | ORAL_TABLET | Freq: Every day | ORAL | Status: DC
  Administered 2024-06-10: 75 ug via ORAL
  Filled 2024-06-09: qty 1

## 2024-06-09 MED ORDER — LEVOTHYROXINE SODIUM 25 MCG PO TABS
25.0000 ug | ORAL_TABLET | Freq: Every day | ORAL | Status: AC
  Administered 2024-06-09: 25 ug via ORAL
  Filled 2024-06-09: qty 1

## 2024-06-09 MED ORDER — LACTULOSE ENEMA
300.0000 mL | Freq: Once | ORAL | Status: AC
  Administered 2024-06-09: 300 mL via RECTAL
  Filled 2024-06-09: qty 300

## 2024-06-09 NOTE — Telephone Encounter (Signed)
 Opened in error

## 2024-06-09 NOTE — Plan of Care (Signed)
  Problem: Education: Goal: Knowledge of General Education information will improve Description: Including pain rating scale, medication(s)/side effects and non-pharmacologic comfort measures Outcome: Progressing   Problem: Clinical Measurements: Goal: Ability to maintain clinical measurements within normal limits will improve Outcome: Progressing Goal: Diagnostic test results will improve Outcome: Progressing   Problem: Coping: Goal: Level of anxiety will decrease Outcome: Progressing

## 2024-06-09 NOTE — Progress Notes (Signed)
 Mobility Specialist - Progress Note   06/09/24 1037  Mobility  Activity Ambulated with assistance in hallway  Level of Assistance Contact guard assist, steadying assist  Assistive Device Front wheel walker  Distance Ambulated (ft) 180 ft  Range of Motion/Exercises Active  Activity Response Tolerated well  Mobility Referral Yes  Mobility visit 1 Mobility  Mobility Specialist Start Time (ACUTE ONLY) 1010  Mobility Specialist Stop Time (ACUTE ONLY) 1037  Mobility Specialist Time Calculation (min) (ACUTE ONLY) 27 min   Pt was found on recliner chair and agreeable to ambulate. No complaints with session. At EOS returned to recliner chair with all needs met. Call bell in reach and husband in room.  Erminio Leos,  Mobility Specialist Can be reached via Secure Chat

## 2024-06-09 NOTE — Progress Notes (Signed)
 [facility] Progress Note  Patient: [name]     FMW:[id number]  DOA: 06/07/2024   PCP: [name], NP       Brief hospital course: 73 year old female with history of colon cancer, hepatic encephalopathy, esophageal varices and hypothyroidism who presents to the hospital for altered mental status.  She was recently admitted for the same and was discharged home on lactulose .  She was also prescribed Xifaxan  but her husband states that it is cross prohibitive and he has not been giving it to her regularly.  Subjective:  No BM since yesterday AM. Husband at bedside states she continues to be confused.  Assessment and Plan: Principal Problem:   Hepatic encephalopathy -Recently was admitted for confusion and had neurowork-up which did not reveal any other cause for her confusion - remains encephalopathic - Lactulose  enema was ordered but patient did not receive this- will re-order it today - Xifaxin has  been difficult to obtain due to cost - Appreciate management per GI -   f/u thiamine  level   Active Problems:   MASH cirrhosis elevated LFTs and history of esophageal varices - History of variceal hemorrhage in 2012 - Status post TIPS -[facility] - Ultrasound abdomen ordered by GI does not show any ascites    Acquired hypothyroidism - TSH quite elevated at 28.38 on 7/14- improved to 16.339 when rechecked 2 days later - TSH was normal at 1.675 a month ago - noted to be hypothermic to  -Have increased dose of Synthroid  to 75 mcg  Hypothermia - temp 93- per husband this happens occasionally at home - may be secondary to hypothyroidism but also can be a rare manifestation of severe cirrhosis  Peptic ulcer disease - EGD performed on 7/6 reveals multiple small gastric ulcers - Continue Protonix   Iron deficiency anemia Pancytopenia - Continue oral iron supplements - B12 and folate levels  checked previously and was found to be normal -  WBC down to 2.5 today - obtain peripheral blood smear and CBC with differential - ? 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 status post FOLFOX and hemicolectomy  H/o embolic CVA - cont ASA 81 mg daily  H/o orthostatic hypotension - adrenal insuffiencey has been ruled out in the past - on Midodrine   - add TEDs  Pedal edema - elevate feet while in bed          Code Status: Full Code Total time on patient care: 35 minutes DVT prophylaxis:  enoxaparin  (LOVENOX ) injection 40 mg Start: 06/07/24 2200 SCDs Start: 06/07/24 1646     Objective:   Vitals:   06/08/24 1740 06/08/24 2047 06/09/24 0500 06/09/24 0507  BP: (!) 115/55 114/63  135/74  Pulse: (!) 58 62  (!) 58  Resp: 20     Temp: 97.6 F (36.4 C) 98.5 F (36.9 C)  97.6 F (36.4 C)  TempSrc: Oral Oral  Oral  SpO2: 96% 96%  96%  Weight:   71 kg   Height:       Filed Weights   06/07/24 2313 06/08/24 0431 06/09/24 0500  Weight: 71.4 kg 71.7 kg 71 kg   Exam: General exam: Appears comfortable- remains confused HEENT: oral mucosa moist Respiratory system: Clear to auscultation.  Cardiovascular system: S1 & S2 heard  Gastrointestinal system: Abdomen soft, non-tender, nondistended. Normal bowel sounds   Extremities: No cyanosis, clubbing-has pedal edema  CBC: Recent Labs  Lab 06/07/24 1340  06/08/24 0522  WBC 3.4* 2.6*  HGB 10.9* 10.0*  HCT 34.0* 31.2*  MCV 101.2* 100.6*  PLT 122* 102*   Basic Metabolic Panel: Recent Labs  Lab 06/07/24 1340 06/08/24 0522  NA 140 140  K 3.4* 3.7  CL 100 105  CO2 32 28  GLUCOSE 107* 71  BUN 22 20  CREATININE 1.09* 0.93  CALCIUM  8.8* 8.3*  MG  --  2.0     Scheduled Meds:  (feeding supplement) PROSource Plus  30 mL Oral TID BM   aspirin  EC  81 mg Oral Daily   enoxaparin  (LOVENOX ) injection  40 mg Subcutaneous Q24H   feeding supplement  1 Container Oral TID BM   lactulose   30 g Oral QID   lactulose   300  mL Rectal Once   levothyroxine   50 mcg Oral Q0600   magnesium  oxide  400 mg Oral Daily   midodrine   10 mg Oral TID WC   multivitamin with minerals  1 tablet Oral Daily   pantoprazole   40 mg Oral BID   polyethylene glycol  17 g Oral Daily   rifaximin   550 mg Oral BID   saccharomyces boulardii  250 mg Oral Q0600   sodium chloride  flush  3 mL Intravenous Q12H    Imaging and lab data personally reviewed   Author: Takoya Jonas  06/09/2024 7:19 AM  To contact [facility]>   Check the care team in [facility] and look for the attending/consulting [facility] provider listed  Log into [email or web address] and use [facility] universal password   Go to> [facility]  and find provider  If you still have difficulty reaching the provider, please page the [facility] (Director on Call) for the Hospitalists listed on amion

## 2024-06-09 NOTE — Progress Notes (Addendum)
   Patient Name: [name] Date of Encounter: 06/09/2024, 1:48 PM     Assessment and Plan  Hepatic encephalopathy in cirrhosis  Chronic constipation  Hx colon cancer s/p R hemi-colectomy  Hypothyroidism  ----------------------------------------------------------------------------------------------------------------- Await response to lactulose  enemas He says that is what it takes Use dulcolax prn at home - reviewed w/ husband need to avoid days w/o BM and not to worry about dependency He says not able to do lactulose  enemas at home Discussed colonoscopy. She is overdue for surveillance. Husband reports years of constipation and she does not have reduced stool caliber just change in frequency of stools at times. Will revisit - she is not in any shape for that right now.    Subjective  Hospital day 3 Labs reviewed Had a rough night w/o sleep. Received lactulose  enema w/ excellent results per husband. More ordered Higher dose of synthroid  Rxed    Objective  BP 116/62 (BP Location: Left Arm)   Pulse (!) 50   Temp 97.6 F (36.4 C) (Oral)   Resp 16   Ht 5' 3 (1.6 m)   Wt 71 kg   SpO2 96%   BMI 27.72 kg/m  Sleeping Awakened easily Clear speech and responds appropriately - no asterixis Abd soft NT   Recent Labs  Lab 06/07/24 1340 06/08/24 0522  NA 140 140  K 3.4* 3.7  CL 100 105  CO2 32 28  GLUCOSE 107* 71  BUN 22 20  CREATININE 1.09* 0.93  CALCIUM  8.8* 8.3*  MG  --  2.0   Recent Labs  Lab 06/07/24 1340 06/08/24 0522  AST 60* 54*  ALT 48* 41  ALKPHOS 153* 120  BILITOT 1.5* 1.6*  PROT 5.7* 5.2*  ALBUMIN  2.9* 2.5*  INR  --  1.6*   Recent Labs  Lab 06/07/24 1340 06/08/24 0522  HGB 10.9* 10.0*  HCT 34.0* 31.2*  WBC 3.4* 2.6*  PLT 122* 102*   Ammonia 90 High  159 High  CM 65 High  CM       Lupita CHARLENA Commander, MD, [facility] See [name] on call - gastroenterology for best contact person 06/09/2024 1:48 PM

## 2024-06-09 NOTE — Progress Notes (Signed)
   06/09/24 1433  Spiritual Encounters  Type of Visit Initial  Care provided to: Pt and family  Conversation partners present during encounter Nurse  Reason for visit Routine spiritual support  OnCall Visit No   Per nurse's request, stopped and visited with patient and spouse. Patient expected to have procedure on this coming Sunday. Talked about patient's faith and various hardships. Patient's condition has caused 6 hospitalization this year alone. Patient's spouse also recovering from cancer. Patient is disappointed that because of previous cancer, she is not eligible for a liver transplant and liver is failing due to chemo. Provided spiritual care through compassionate presence, storytelling/reflective listening and prayer.

## 2024-06-10 ENCOUNTER — Ambulatory Visit: Payer: Self-pay | Admitting: Internal Medicine

## 2024-06-10 DIAGNOSIS — K7682 Hepatic encephalopathy: Secondary | ICD-10-CM | POA: Diagnosis not present

## 2024-06-10 LAB — VITAMIN B1: Vitamin B1 (Thiamine): 85.8 nmol/L (ref 66.5–200.0)

## 2024-06-10 MED ORDER — LACTULOSE ENEMA: 300.0000 mL | Freq: Two times a day (BID) | ORAL | 0 refills | Status: DC | PRN

## 2024-06-10 MED ORDER — LACTULOSE 10 GM/15ML PO SOLN: 30.0000 g | Freq: Four times a day (QID) | ORAL | 0 refills | Status: DC

## 2024-06-10 MED ORDER — LEVOTHYROXINE SODIUM 50 MCG PO TABS: 75.0000 ug | ORAL_TABLET | Freq: Every day | ORAL | 0 refills | Status: DC

## 2024-06-10 MED ORDER — POLYETHYLENE GLYCOL 3350 17 G PO PACK
17.0000 g | PACK | Freq: Every day | ORAL | 0 refills | Status: AC
Start: 2024-06-10 — End: ?

## 2024-06-10 MED ORDER — BISACODYL 10 MG RE SUPP: 10.0000 mg | Freq: Every day | RECTAL | Status: DC | PRN

## 2024-06-10 NOTE — Discharge Summary (Signed)
 Physician Discharge Summary  [name] FMW:[id number] DOB: 09/22/1951 DOA: 06/07/2024  PCP: [name], NP  Admit date: 06/07/2024 Discharge date: 06/10/2024  Discharge Diagnoses:   Principal Problem:   Hepatic encephalopathy (HCC) Active Problems:   Cirrhosis (HCC)   Acquired hypothyroidism   Hx of colon cancer, stage III   Portal hypertension (HCC)   Brief hospital course: 73 year old female with history of colon cancer, hepatic encephalopathy, esophageal varices and hypothyroidism who presents to the hospital for altered mental status.  She was recently admitted for the same and was discharged home on lactulose .      Assessment and Plan: Principal Problem:   Hepatic encephalopathy -Recently was admitted for confusion and had extensive neuro work-up which did not reveal any other cause for her confusion - Unfortunately Xifaxan  is cost prohibitive and her husband is not able to give it to her on a regular basis - patient has improved with oral lactulose  and lactulose  enemas-have prescribed lactulose  enemas for to use at home - Appreciate management per GI   Active Problems:  Episode of hypothermia - Apparently she has had hypothermia frequently in the past  - Suspect secondary to hypothyroidism    Suspected MASH cirrhosis elevated LFTs and history of esophageal varices - Has oxaliplatin induced hepatic cirrhosis and unfortunately is not a liver transplant candidate - History of variceal hemorrhage in 2012 - Status post TIPS -[facility]s [facility] - Ultrasound abdomen negative for ascites - Continue Lasix   Pancytopenia-history of iron deficiency anemia - May be developing hypersplenism secondary to cirrhosis - Continue oral iron  Peptic ulcer disease - EGD performed on 7/6 reveals multiple small gastric ulcers - Continue Protonix    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 status post FOLFOX and hemicolectomy     Acquired  hypothyroidism - TSH quite elevated at 28.38-  -Have increased her Synthroid  from 50 to 75 mcg daily -Recommended to follow-up with her PCP or endocrine to have her TSH level checked in 4 to 6 weeks   Lower extremity edema - Likely secondary to venous stasis - Vascular ultrasound was performed and May and was negative for DVT - Have recommended teds and elevation  History of hypotension - Continue midodrine         Discharge Instructions  Discharge Instructions     Ambulatory referral to Physical Therapy   Complete by: As directed    Ambulatory referral to Physical Therapy   Complete by: As directed       Allergies as of 06/10/2024       Reactions   Contrast Media [iodinated Contrast Media] Hives        Medication List     STOP taking these medications    methylPREDNISolone  4 MG Tbpk tablet Commonly known as: MEDROL  DOSEPAK       TAKE these medications    Accu-Chek Softclix Lancets lancets 3 (three) times daily.   aspirin  EC 81 MG tablet Take 1 tablet (81 mg total) by mouth daily. Swallow whole. What changed: when to take this   Blood Glucose Monitoring Suppl Devi 1 each by Does not apply route in the morning, at noon, and at bedtime. May substitute to any manufacturer covered by patient's insurance.   CRANBERRY PO Take 1 tablet by mouth 2 (two) times daily.   feeding supplement Liqd Take 237 mLs by mouth 2 (two) times daily between meals.   ferrous sulfate  325 (65 FE) MG tablet Take 1 tablet (325 mg  total) by mouth 2 (two) times daily with a meal.   furosemide  20 MG tablet Commonly known as: LASIX  Take 20-40 mg by mouth See admin instructions. Take 20 mg by mouth in the morning and increase the dose to 40 mg if the blood pressure is firm   lactulose  10 GM/15ML Soln enema Commonly known as: CHRONULAC  Place 300 mLs rectally 2 (two) times daily as needed for severe constipation. What changed:  medication strength how much to take how to take  this when to take this reasons to take this additional instructions   lactulose  10 GM/15ML solution Commonly known as: CHRONULAC  Take 45 mLs (30 g total) by mouth 4 (four) times daily. What changed: when to take this   levOCARNitine  330 MG tablet Commonly known as: CARNITOR  Take 330 mg by mouth See admin instructions. Take 330 mg by mouth with breakfast, between 3-5 PM, and at bedtime   levothyroxine  50 MCG tablet Commonly known as: SYNTHROID  Take 1.5 tablets (75 mcg total) by mouth daily before breakfast. What changed:  how much to take Another medication with the same name was removed. Continue taking this medication, and follow the directions you see here.   liver oil-zinc  oxide 40 % ointment Commonly known as: DESITIN Apply topically daily as needed for irritation.   magnesium  oxide 400 MG tablet Commonly known as: MAG-OX Take 1 tablet (400 mg total) by mouth daily. What changed: when to take this   midodrine  10 MG tablet Commonly known as: PROAMATINE  Take 1 tablet (10 mg total) by mouth 3 (three) times daily with meals. What changed:  how much to take when to take this additional instructions   Multi-Vitamin tablet Take 1 tablet by mouth in the morning.   pantoprazole  40 MG tablet Commonly known as: PROTONIX  Take 1 tablet (40 mg total) by mouth 2 (two) times daily. What changed: when to take this   polyethylene glycol 17 g packet Commonly known as: MIRALAX  / GLYCOLAX  Take 17 g by mouth daily.   Probiotic Pearls Womens Caps Take 1 capsule by mouth in the morning.   PROTEIN PO Take 1 Bottle by mouth See admin instructions. Fair Life Nutrition Protein Shake- Drink 1 bottle by mouth twice a day   rifaximin  550 MG Tabs tablet Commonly known as: XIFAXAN  Take 1 tablet (550 mg total) by mouth 2 (two) times daily. What changed: when to take this        Follow-up Information     [facility] Follow up.   Specialty:  Rehabilitation Why: outpatient rehab-they will call for appointment Contact information: [address] [place]  [address] [phone]                   The results of significant diagnostics from this hospitalization (including imaging, microbiology, ancillary and laboratory) are listed below for reference.    US  ASCITES (ABDOMEN LIMITED) Result Date: 06/07/2024 EXAM: LIMITED ABDOMINAL ULTRASOUND FOR ASCITES EVALUATION TECHNIQUE: Limited real-time sonography of all 4 quadrants of the abdomen was performed for evaluation of ascites. COMPARISON: None. CLINICAL HISTORY: [id number] Ascites [id number]. Ascites. FINDINGS: RIGHT UPPER QUADRANT: No ascites seen. Incidental note of cholelithiasis. LEFT UPPER QUADRANT: No ascites seen. RIGHT LOWER QUADRANT: No ascites seen. LEFT LOWER QUADRANT: No ascites seen. OTHER: Limited visualization of the rest of the abdomen demonstrates no acute abnormality. IMPRESSION: 1. No significant ascites. Electronically signed by: Norman Gatlin MD 06/07/2024 05:47 PM EDT RP Workstation: [id number]   CARDIAC EVENT MONITOR Result  Date: 06/04/2024   Patient had a minimum heart rate of 44 bpm, maximum heart rate of 114 bpm, and average heart rate of 69 bpm. Predominant underlying rhythm was sinus rhythm. Paroxsymal atrial fibrillation, 1% burden, longest event was over 6 hours Isolated PACs were rare (<1.0%). Isolated PVCs were rare (<1.0%). No triggered and diary events. Asymptomatic paroxsymal atrial fibrillation.  US  ABDOMEN LIMITED WITH LIVER DOPPLER Result Date: 05/26/2024 CLINICAL DATA:  Cirrhosis with ascites. EXAM: DUPLEX ULTRASOUND OF LIVER TECHNIQUE: Color and duplex Doppler ultrasound was performed to evaluate the hepatic in-flow and out-flow vessels. COMPARISON:  Ultrasound 03/31/2024 FINDINGS: Liver: Coarsened hepatic echotexture No focal lesion, mass or intrahepatic biliary ductal dilatation. Gallbladder: No sonographic Murphy's sign. No wall  thickening. Cholelithiasis. Portal Vein Velocities Main Prox:  21.9 cm/sec; previously 70 centimeters/second Right: 30.0 cm/sec; previously says 71 centimeters/second Left: 27.6 cm/sec; previously 41 centimeters/second TIPS Proximal (portal): 53.9 cm/s;  previously 74 cm/s MID: 57.1 cm/s; previously 78 cm/s Distal (hepatic vein): 36.4 cm/s; previously 96 cm/s Hepatic Vein Velocities Right:  16.9 cm/sec; Middle:  20.8 cm/sec; Left:  15.8 cm/sec; IVC: Present and patent with normal respiratory phasicity. Hepatic Artery Velocity:  63.2 cm/sec Splenic Vein Velocity:  24.9 cm/sec Spleen: 11.2 cm x 4.6 cm x 3.5 cm Portal Vein Occlusion/Thrombus: No Splenic Vein Occlusion/Thrombus: No Ascites: None Varices: None IMPRESSION: 1. Patent TIPS. Decreased velocities in the portal vein and tips compared to prior 2. Cholelithiasis. Electronically Signed   By: Norman Gatlin M.D.   On: 05/26/2024 00:03   US  EKG SITE RITE Result Date: 05/25/2024 If Site Rite image not attached, placement could not be confirmed due to current cardiac rhythm.  CT HEAD WO CONTRAST ( ) Result Date: 05/24/2024 CLINICAL DATA:  Altered mental status. EXAM: CT HEAD WITHOUT CONTRAST TECHNIQUE: Contiguous axial images were obtained from the base of the skull through the vertex without intravenous contrast. RADIATION DOSE REDUCTION: This exam was performed according to the departmental dose-optimization program which includes automated exposure control, adjustment of the mA and/or kV according to patient size and/or use of iterative reconstruction technique. COMPARISON:  Same day MRI head.  CT head 05/20/2024. FINDINGS: Brain: No acute intracranial hemorrhage. Evolving left PCA territory infarct. No additional areas of infarct appreciated on CT. Nonspecific hypoattenuation in the periventricular and subcortical white matter favored to reflect chronic microvascular ischemic changes. No edema, mass effect, or midline shift. The basilar cisterns are  patent. No extra-axial fluid collections. Ventricles: The ventricles are normal. Vascular: No hyperdense vessel or unexpected calcification. Skull: No acute or aggressive finding. Orbits: Bilateral lens replacement.  Orbits otherwise unremarkable. Sinuses: The visualized paranasal sinuses are clear. Other: Mastoid air cells are clear. IMPRESSION: Evolving left PCA territory infarct. No evidence of hemorrhagic conversion. Chronic microvascular ischemic changes. Electronically Signed   By: Donnice Mania M.D.   On: 05/24/2024 21:58   MR BRAIN WO CONTRAST Result Date: 05/24/2024 CLINICAL DATA:  Mental status change, unknown cause EXAM: MRI HEAD WITHOUT CONTRAST TECHNIQUE: Multiplanar, multiecho pulse sequences of the brain and surrounding structures were obtained without intravenous contrast. COMPARISON:  MRI head April 23, 2024. FINDINGS: Moderately motion limited study.  Within this limitation: Brain: Evolution of previously seen left PCA territory infarcts which demonstrates decreased conspicuity of restricted diffusion and decreased edema. No evidence of new/interval acute infarct on motion limited assessment. No evidence of acute hemorrhage, mass lesion, midline shift or hydrocephalus. Similar remote right thalamic lacunar infarct and advanced scattered T2/FLAIR hyperintensities in the white matter, compatible with chronic  microvascular ischemic disease. Vascular: Major arterial flow voids are maintained skull base. Skull and upper cervical spine: Normal marrow signal. Sinuses/Orbits: Negative. Other: None. IMPRESSION: 1. Expected evolution of left PCA infarcts without evidence of new/interval acute infarct, hemorrhage or progressive mass effect. 2. Similar advanced chronic microvascular ischemic disease. Electronically Signed   By: Gilmore GORMAN Molt M.D.   On: 05/24/2024 20:10   EEG adult Result Date: 05/24/2024 Shelton Arlin KIDD, MD     05/24/2024  5:38 PM Patient Name: Jennessa Trigo MRN: [id number] Epilepsy  Attending: Arlin KIDD Shelton Referring Physician/Provider: Michaela Aisha SQUIBB, MD Date: 05/24/2024 Duration: 25.07 mins Patient history: 73yo F with ams. EEG to evaluate for seizure. Level of alertness: Awake AEDs during EEG study: None Technical aspects: This EEG study was done with scalp electrodes positioned according to the 10-20 International system of electrode placement. Electrical activity was reviewed with band pass filter of 1-70Hz , sensitivity of 7 uV/mm, display speed of 52mm/sec with a 60Hz  notched filter applied as appropriate. EEG data were recorded continuously and digitally stored.  Video monitoring was available and reviewed as appropriate. Description: The posterior dominant rhythm consists of 7.5 Hz activity of moderate voltage (25-35 uV) seen predominantly in posterior head regions, symmetric and reactive to eye opening and eye closing. EEG showed continuous generalized predominantly 5 to 6 Hz theta slowing admixed with intermittent 2-3hz  delta slowing. Hyperventilation and photic stimulation were not performed.    ABNORMALITY - Continuous slow, generalized  IMPRESSION: This study is suggestive of mild to moderate diffuse encephalopathy. No seizures or epileptiform discharges were seen throughout the recording.  Priyanka O Yadav   CT Head Wo Contrast Result Date: 05/20/2024 CLINICAL DATA:  Mental status change, unknown cause EXAM: CT HEAD WITHOUT CONTRAST TECHNIQUE: Contiguous axial images were obtained from the base of the skull through the vertex without intravenous contrast. RADIATION DOSE REDUCTION: This exam was performed according to the departmental dose-optimization program which includes automated exposure control, adjustment of the mA and/or kV according to patient size and/or use of iterative reconstruction technique. COMPARISON:  MRI head April 23, 2024. FINDINGS: Brain: No evidence of acute infarction, hemorrhage, hydrocephalus, extra-axial collection or mass lesion/mass effect.  Evolving left PCA territory infarct. Advanced white matter hypodensities, better characterized on recent MRI. Vascular: No hyperdense vessel. Skull: Normal. Negative for fracture or focal lesion. Sinuses/Orbits: Clear sinuses.  No acute findings. IMPRESSION: 1. Evolving left PCA territory infarct without evidence of new/interval acute abnormality. 2. Advanced white matter disease, further evaluated on recent MRI. Electronically Signed   By: Gilmore GORMAN Molt M.D.   On: 05/20/2024 20:23   DG Chest Port 1 View Result Date: 05/20/2024 CLINICAL DATA:  confusion EXAM: PORTABLE CHEST - 1 VIEW COMPARISON:  Mar 31, 2024 FINDINGS: Low lung volumes. No focal airspace consolidation, pleural effusion, or pneumothorax. Moderate cardiomegaly. Tortuous aorta with aortic atherosclerosis. No acute fracture or destructive lesions. Multilevel thoracic osteophytosis. Electronic structure overlying the midline chest. IMPRESSION: 1. Moderate cardiomegaly.  No pneumonia or pulmonary edema. 2. Electronic structure overlying the midline chest, likely external to the patient. Correlation with physical exam findings recommended. Electronically Signed   By: Rogelia Myers M.D.   On: 05/20/2024 17:32   Labs:   Basic Metabolic Panel: Recent Labs  Lab 06/07/24 1340 06/08/24 0522  NA 140 140  K 3.4* 3.7  CL 100 105  CO2 32 28  GLUCOSE 107* 71  BUN 22 20  CREATININE 1.09* 0.93  CALCIUM  8.8* 8.3*  MG  --  2.0  CBC: Recent Labs  Lab 06/07/24 1340 06/08/24 0522 06/09/24 1435  WBC 3.4* 2.6* 3.1*  NEUTROABS  --   --  1.9  HGB 10.9* 10.0* 10.5*  HCT 34.0* 31.2* 34.2*  MCV 101.2* 100.6* 103.6*  PLT 122* 102* 103*         SIGNED:   True Atlas, MD  [facility] 06/10/2024, 5:17 PM Time taking on discharge: 50 minutes

## 2024-06-10 NOTE — Progress Notes (Signed)
 Pt discharged home today per Dr. Earley. Pt's IV site D/C'd and WDL. Pt's VSS. Pt and husband provided with home medication list, discharge instructions and prescriptions. Verbalized understanding. Pt left floor via WC in stable condition accompanied by RN.

## 2024-06-10 NOTE — TOC Transition Note (Signed)
 Transition of Care [facility]) - Discharge Note   Patient Details  Name: [name] MRN: [id number] Date of Birth: 01-Jul-1951  Transition of Care [facility]) CM/SW Contact:  Sonda Manuella Quill, RN Phone Number: 06/10/2024, 9:56 AM   Clinical Narrative:    D/C orders received; no TOC needs,   Final next level of care: Home/Self Care Barriers to Discharge: No Barriers Identified   Patient Goals and CMS Choice Patient states their goals for this hospitalization and ongoing recovery are:: Home CMS Medicare.gov Compare Post Acute Care list provided to:: Patient Represenative (must comment) Choice offered to / list presented to : Spouse [place] ownership interest in [facility].provided to:: Spouse    Discharge Placement                       Discharge Plan and Services Additional resources added to the After Visit Summary for     Discharge Planning Services: CM Consult Post Acute Care Choice: Durable Medical Equipment (rw,cane)                               Social Drivers of Health (SDOH) Interventions SDOH Screenings   Food Insecurity: No Food Insecurity (06/07/2024)  Housing: Low Risk  (05/21/2024)  Transportation Needs: No Transportation Needs (05/21/2024)  Utilities: Not At Risk (05/21/2024)  Alcohol  Screen: Low Risk  (03/27/2024)  Depression (PHQ2-9): Low Risk  (05/11/2024)  Financial Resource Strain: Low Risk  (05/10/2024)  Physical Activity: Insufficiently Active (05/10/2024)  Social Connections: Socially Integrated (06/07/2024)  Recent Concern: Social Connections - Moderately Isolated (03/30/2024)  Stress: No Stress Concern Present (05/10/2024)  Tobacco Use: Low Risk  (06/07/2024)     Readmission Risk Interventions    05/29/2024   11:06 AM 05/22/2024    3:58 PM 03/31/2024    2:51 PM  Readmission Risk Prevention Plan  Transportation Screening Complete Complete Complete  PCP or Specialist Appt within 3-5 Days   Complete  HRI or Home Care Consult    Complete  Social Work Consult for Recovery Care Planning/Counseling   Complete  Palliative Care Screening   Not Applicable  Medication Review Oceanographer) Complete Complete Complete  PCP or Specialist appointment within 3-5 days of discharge Complete    HRI or Home Care Consult Complete Complete   SW Recovery Care/Counseling Consult Complete Complete   Palliative Care Screening Not Applicable Not Applicable   Skilled Nursing Facility Not Applicable Not Applicable

## 2024-06-12 ENCOUNTER — Telehealth: Payer: Self-pay

## 2024-06-12 NOTE — Transitions of Care (Post Inpatient/ED Visit) (Unsigned)
   06/12/2024  Name: [name] MRN: [id number] DOB: 02/24/1951  Today's TOC FU Call Status: Today's TOC FU Call Status:: Unsuccessful Call (1st Attempt) Unsuccessful Call (1st Attempt) Date: 06/12/24  Attempted to reach the patient regarding the most recent Inpatient/ED visit.  Follow Up Plan: Additional outreach attempts will be made to reach the patient to complete the Transitions of Care (Post Inpatient/ED visit) call.   Signature   Avelina Essex, CMA (AAMA)  [facility]- [facility] [phone]

## 2024-06-13 ENCOUNTER — Telehealth: Payer: Self-pay | Admitting: Internal Medicine

## 2024-06-13 ENCOUNTER — Ambulatory Visit: Admitting: Physical Therapy

## 2024-06-13 NOTE — Telephone Encounter (Signed)
 Inbound call from patient's husband, requesting samples of Xifaxin, they state they are in the process of insurance approval and assistance, but Dr. Albertus advised that they could get samples in the meantime. Please advise.

## 2024-06-13 NOTE — Telephone Encounter (Signed)
 Home address confirmed with the patient's husband. Signed, printed prescription placed in the mail today.

## 2024-06-13 NOTE — Telephone Encounter (Signed)
 Spoke to patient's husband and told him that I did have some Xifaxan  and would put it up front for him to pick up.  He agreed.

## 2024-06-14 ENCOUNTER — Encounter: Payer: Self-pay | Admitting: Nurse Practitioner

## 2024-06-14 ENCOUNTER — Ambulatory Visit (INDEPENDENT_AMBULATORY_CARE_PROVIDER_SITE_OTHER): Admitting: Nurse Practitioner

## 2024-06-14 VITALS — BP 110/58 | HR 47 | Temp 97.0°F | Ht 63.0 in | Wt 164.0 lb

## 2024-06-14 DIAGNOSIS — K7469 Other cirrhosis of liver: Secondary | ICD-10-CM | POA: Diagnosis not present

## 2024-06-14 DIAGNOSIS — R7989 Other specified abnormal findings of blood chemistry: Secondary | ICD-10-CM

## 2024-06-14 DIAGNOSIS — Z09 Encounter for follow-up examination after completed treatment for conditions other than malignant neoplasm: Secondary | ICD-10-CM | POA: Diagnosis not present

## 2024-06-14 LAB — COMPREHENSIVE METABOLIC PANEL WITH GFR
ALT: 54 U/L — ABNORMAL HIGH (ref 0–35)
AST: 82 U/L — ABNORMAL HIGH (ref 0–37)
Albumin: 3.1 g/dL — ABNORMAL LOW (ref 3.5–5.2)
Alkaline Phosphatase: 185 U/L — ABNORMAL HIGH (ref 39–117)
BUN: 15 mg/dL (ref 6–23)
CO2: 32 meq/L (ref 19–32)
Calcium: 9.4 mg/dL (ref 8.4–10.5)
Chloride: 105 meq/L (ref 96–112)
Creatinine, Ser: 0.99 mg/dL (ref 0.40–1.20)
GFR: 56.75 mL/min — ABNORMAL LOW (ref >60.00)
Glucose, Bld: 92 mg/dL (ref 70–99)
Potassium: 3.9 meq/L (ref 3.5–5.1)
Sodium: 141 meq/L (ref 135–145)
Total Bilirubin: 1.1 mg/dL (ref 0.2–1.2)
Total Protein: 6.1 g/dL (ref 6.0–8.3)

## 2024-06-14 LAB — CBC
HCT: 33.9 % — ABNORMAL LOW (ref 36.0–46.0)
Hemoglobin: 11 g/dL — ABNORMAL LOW (ref 12.0–15.0)
MCHC: 32.6 g/dL (ref 30.0–36.0)
MCV: 98.1 fl (ref 78.0–100.0)
Platelets: 101 K/uL — ABNORMAL LOW (ref 150.0–400.0)
RBC: 3.45 Mil/uL — ABNORMAL LOW (ref 3.87–5.11)
RDW: 21.7 % — ABNORMAL HIGH (ref 11.5–15.5)
WBC: 3.4 K/uL — ABNORMAL LOW (ref 4.0–10.5)

## 2024-06-14 NOTE — Progress Notes (Signed)
 Acute Office Visit  Subjective:     Patient ID: [name], female    DOB: 08-06-1951, 73 y.o.   MRN: [id number]  Chief Complaint  Patient presents with   Hospitalization Follow-up    HPI Patient is in today for hospital follow-up  Patient was in the emergency department on 06/07/2024.  At that juncture patient endorsed feeling weak, poor, nauseous, anorexic without pain.  Also noted to be more withdrawn and listless.  Lactulose  to 60 mg 3 times daily from her baseline of 45 mg.  Patient's ammonia did come back elevated at 159 along with TSH being elevated at 16.339.  Patient was admitted.  GI was consulted on 06/07/2024.  They ordered a lactulose  enema continue Xifaxan  550 mg twice daily and ultrasound of abdomen to look for ascites.  Patient was discharged on 06/10/2024.  Xifaxan  was encouraged to be used continued at home but is cost prohibitive.  They did order lactulose  enema and oral lactulose  for her at home.  Ultrasound was negative for ascites in the hospital.  Wanted patient to continue Lasix .  Increase levothyroxine  to 75 mcg daily and will need to repeat a repeat in 4 to 6 weeks.  She is here for follow-up  Stats that thye are doing 60ml of lactulose  three times a day and miralax . She has been having bowel movements daily but they are the caliber of a pencil. There is a large volume  They are working on getting the xifan. He was able to get a couple weeks worth of samples. He is working with the liver clinic. He mentions that dawn is going to write a paperscript  Review of Systems  Constitutional:  Negative for chills and fever.  Respiratory:  Negative for shortness of breath.   Cardiovascular:  Negative for chest pain.  Gastrointestinal:  Negative for abdominal pain, constipation, diarrhea, nausea and vomiting.        Objective:    BP (!) 110/58   Pulse (!) 47   Temp (!) 97 F (36.1 C) (Temporal)   Ht 5' 3 (1.6 m)   Wt 164 lb (74.4 kg)   SpO2 97%   BMI 29.05  kg/m  BP Readings from Last 3 Encounters:  06/14/24 (!) 110/58  06/10/24 (!) 112/45  06/02/24 116/70   Wt Readings from Last 3 Encounters:  06/14/24 164 lb (74.4 kg)  06/10/24 156 lb 1.4 oz (70.8 kg)  06/02/24 159 lb (72.1 kg)   SpO2 Readings from Last 3 Encounters:  06/14/24 97%  06/10/24 94%  06/02/24 97%      Physical Exam Vitals and nursing note reviewed.  Constitutional:      Appearance: Normal appearance.  Cardiovascular:     Rate and Rhythm: Normal rate and regular rhythm.     Heart sounds: Normal heart sounds.  Pulmonary:     Effort: Pulmonary effort is normal.     Breath sounds: Normal breath sounds.  Abdominal:     General: Bowel sounds are normal. There is no distension.  Neurological:     Mental Status: She is alert.     No results found for any visits on 06/14/24.      Assessment & Plan:   Problem List Items Addressed This Visit       Digestive   Cirrhosis (HCC) - Primary   Relevant Orders   CBC   Comprehensive metabolic panel with GFR   AMB Referral VBCI Care Management     [facility]  discharge follow-up   Reviewed emergency department note and inpatient note along with discharge summary.  Reviewed imaging and most recent labs      Relevant Orders   CBC   Comprehensive metabolic panel with GFR   Abnormal CBC   Relevant Orders   CBC    No orders of the defined types were placed in this encounter.   Return in about 6 weeks (around 07/26/2024) for tsh.  [name], NP

## 2024-06-14 NOTE — Assessment & Plan Note (Signed)
 Reviewed emergency department note and inpatient note along with discharge summary.  Reviewed imaging and most recent labs

## 2024-06-15 ENCOUNTER — Telehealth: Payer: Self-pay

## 2024-06-15 DIAGNOSIS — K766 Portal hypertension: Secondary | ICD-10-CM | POA: Diagnosis not present

## 2024-06-15 DIAGNOSIS — I8510 Secondary esophageal varices without bleeding: Secondary | ICD-10-CM | POA: Diagnosis not present

## 2024-06-15 DIAGNOSIS — K7460 Unspecified cirrhosis of liver: Secondary | ICD-10-CM | POA: Diagnosis not present

## 2024-06-15 DIAGNOSIS — G9341 Metabolic encephalopathy: Secondary | ICD-10-CM | POA: Diagnosis not present

## 2024-06-15 DIAGNOSIS — K7682 Hepatic encephalopathy: Secondary | ICD-10-CM | POA: Diagnosis not present

## 2024-06-15 DIAGNOSIS — M170 Bilateral primary osteoarthritis of knee: Secondary | ICD-10-CM | POA: Diagnosis not present

## 2024-06-15 DIAGNOSIS — K8020 Calculus of gallbladder without cholecystitis without obstruction: Secondary | ICD-10-CM | POA: Diagnosis not present

## 2024-06-15 DIAGNOSIS — I119 Hypertensive heart disease without heart failure: Secondary | ICD-10-CM | POA: Diagnosis not present

## 2024-06-15 DIAGNOSIS — I480 Paroxysmal atrial fibrillation: Secondary | ICD-10-CM | POA: Diagnosis not present

## 2024-06-15 DIAGNOSIS — D509 Iron deficiency anemia, unspecified: Secondary | ICD-10-CM | POA: Diagnosis not present

## 2024-06-15 DIAGNOSIS — N179 Acute kidney failure, unspecified: Secondary | ICD-10-CM | POA: Diagnosis not present

## 2024-06-15 DIAGNOSIS — E039 Hypothyroidism, unspecified: Secondary | ICD-10-CM | POA: Diagnosis not present

## 2024-06-15 NOTE — Progress Notes (Signed)
 Complex Care Management Note Care Guide Note  06/15/2024 Name: [name] MRN: [id number] DOB: 1951/11/16  [name] is a 73 y.o. year old female who is a primary care patient of [name], NP . The community resource team was consulted for assistance with Transportation Needs  and Food Insecurity  SDOH screenings and interventions completed:  Yes  Social Drivers of Health From This Encounter   Food Insecurity: No Food Insecurity (06/15/2024)   Hunger Vital Sign    Worried About Running Out of Food in the Last Year: Never true    Ran Out of Food in the Last Year: Never true  Housing: Low Risk  (06/15/2024)   Housing Stability Vital Sign    Unable to Pay for Housing in the Last Year: No    Number of Times Moved in the Last Year: 0    Homeless in the Last Year: No  Financial Resource Strain: Low Risk  (06/15/2024)   Overall Financial Resource Strain (CARDIA)    Difficulty of Paying Living Expenses: Not hard at all  Transportation Needs: No Transportation Needs (06/15/2024)   PRAPARE - Administrator, Civil Service (Medical): No    Lack of Transportation (Non-Medical): No  Utilities: Not At Risk (06/15/2024)   Utilities    Threatened with loss of utilities: No    SDOH Interventions Today    Flowsheet Row Most Recent Value  SDOH Interventions   Food Insecurity Interventions Other (Comment)  [Gave patient the number for [employer] [phone] as requested she is able to call unassisted. She is not interested in a referral to Meals on Wheels at this time.]  Transportation Interventions Other (Comment)  [Spoke with patient and her spouse and both stated they have church members and family that can provide transportation. She stated she is not interested in a referral to [employer] transportation at this time.]     Care guide performed the following interventions: Patient provided with information about care guide support team and interviewed to confirm resource  needs.  Follow Up Plan:  No further follow up planned at this time. The patient has been provided with needed resources.  Encounter Outcome:  Patient Visit Completed  [name] [facility]  [facility] Guide Direct Dial: [phone]  Fax: [phone] Website: [email or web address]

## 2024-06-15 NOTE — Telephone Encounter (Signed)
 Initial Outreach scheduled with PHARMDGLENWOOD Shaver on 06/22/24 @ 1 pm. Thanks!  Leotis Rase [facility], [facility] Guide  Direct Dial: [phone]  Fax [phone]

## 2024-06-16 ENCOUNTER — Ambulatory Visit: Payer: Self-pay | Admitting: Nurse Practitioner

## 2024-06-16 ENCOUNTER — Ambulatory Visit

## 2024-06-19 ENCOUNTER — Inpatient Hospital Stay: Admitting: Nurse Practitioner

## 2024-06-19 ENCOUNTER — Other Ambulatory Visit: Payer: Self-pay

## 2024-06-19 ENCOUNTER — Observation Stay (HOSPITAL_COMMUNITY)
Admission: EM | Admit: 2024-06-19 | Discharge: 2024-06-20 | Disposition: A | Discharge status: Home / Self Care | Attending: Internal Medicine | Admitting: Internal Medicine

## 2024-06-19 ENCOUNTER — Encounter: Payer: Self-pay | Admitting: Nurse Practitioner

## 2024-06-19 ENCOUNTER — Encounter (HOSPITAL_COMMUNITY): Payer: Self-pay | Admitting: Emergency Medicine

## 2024-06-19 ENCOUNTER — Observation Stay (HOSPITAL_COMMUNITY)

## 2024-06-19 DIAGNOSIS — K7460 Unspecified cirrhosis of liver: Secondary | ICD-10-CM | POA: Diagnosis present

## 2024-06-19 DIAGNOSIS — K766 Portal hypertension: Secondary | ICD-10-CM | POA: Diagnosis present

## 2024-06-19 DIAGNOSIS — Z7982 Long term (current) use of aspirin: Secondary | ICD-10-CM | POA: Insufficient documentation

## 2024-06-19 DIAGNOSIS — R262 Difficulty in walking, not elsewhere classified: Secondary | ICD-10-CM | POA: Insufficient documentation

## 2024-06-19 DIAGNOSIS — R4182 Altered mental status, unspecified: Secondary | ICD-10-CM | POA: Diagnosis present

## 2024-06-19 DIAGNOSIS — R400 Somnolence: Secondary | ICD-10-CM | POA: Diagnosis not present

## 2024-06-19 DIAGNOSIS — K7682 Hepatic encephalopathy: Principal | ICD-10-CM | POA: Diagnosis present

## 2024-06-19 DIAGNOSIS — E876 Hypokalemia: Secondary | ICD-10-CM | POA: Diagnosis not present

## 2024-06-19 DIAGNOSIS — D61818 Other pancytopenia: Secondary | ICD-10-CM | POA: Diagnosis not present

## 2024-06-19 DIAGNOSIS — K7581 Nonalcoholic steatohepatitis (NASH): Secondary | ICD-10-CM | POA: Diagnosis not present

## 2024-06-19 DIAGNOSIS — I480 Paroxysmal atrial fibrillation: Secondary | ICD-10-CM | POA: Diagnosis present

## 2024-06-19 DIAGNOSIS — D649 Anemia, unspecified: Secondary | ICD-10-CM | POA: Diagnosis present

## 2024-06-19 DIAGNOSIS — K279 Peptic ulcer, site unspecified, unspecified as acute or chronic, without hemorrhage or perforation: Secondary | ICD-10-CM | POA: Insufficient documentation

## 2024-06-19 DIAGNOSIS — D696 Thrombocytopenia, unspecified: Secondary | ICD-10-CM | POA: Diagnosis present

## 2024-06-19 DIAGNOSIS — K7290 Hepatic failure, unspecified without coma: Secondary | ICD-10-CM | POA: Diagnosis not present

## 2024-06-19 DIAGNOSIS — I6782 Cerebral ischemia: Secondary | ICD-10-CM | POA: Diagnosis not present

## 2024-06-19 DIAGNOSIS — E039 Hypothyroidism, unspecified: Secondary | ICD-10-CM | POA: Diagnosis present

## 2024-06-19 DIAGNOSIS — Z95828 Presence of other vascular implants and grafts: Secondary | ICD-10-CM | POA: Diagnosis not present

## 2024-06-19 LAB — CBC
HCT: 30.6 % — ABNORMAL LOW (ref 36.0–46.0)
Hemoglobin: 9.6 g/dL — ABNORMAL LOW (ref 12.0–15.0)
MCH: 31.8 pg (ref 26.0–34.0)
MCHC: 31.4 g/dL (ref 30.0–36.0)
MCV: 101.3 fL — ABNORMAL HIGH (ref 80.0–100.0)
Platelets: 93 K/uL — ABNORMAL LOW (ref 150–400)
RBC: 3.02 MIL/uL — ABNORMAL LOW (ref 3.87–5.11)
RDW: 19.9 % — ABNORMAL HIGH (ref 11.5–15.5)
WBC: 2.2 K/uL — ABNORMAL LOW (ref 4.0–10.5)
nRBC: 0 % (ref 0.0–0.2)

## 2024-06-19 LAB — COMPREHENSIVE METABOLIC PANEL WITH GFR
ALT: 50 U/L — ABNORMAL HIGH (ref 0–44)
AST: 71 U/L — ABNORMAL HIGH (ref 15–41)
Albumin: 2.6 g/dL — ABNORMAL LOW (ref 3.5–5.0)
Alkaline Phosphatase: 153 U/L — ABNORMAL HIGH (ref 38–126)
Anion gap: 9 (ref 5–15)
BUN: 17 mg/dL (ref 8–23)
CO2: 29 mmol/L (ref 22–32)
Calcium: 8.8 mg/dL — ABNORMAL LOW (ref 8.9–10.3)
Chloride: 104 mmol/L (ref 98–111)
Creatinine, Ser: 0.99 mg/dL (ref 0.44–1.00)
GFR, Estimated: >60 mL/min (ref >60)
Glucose, Bld: 125 mg/dL — ABNORMAL HIGH (ref 70–99)
Potassium: 3 mmol/L — ABNORMAL LOW (ref 3.5–5.1)
Sodium: 142 mmol/L (ref 135–145)
Total Bilirubin: 1.3 mg/dL — ABNORMAL HIGH (ref 0.0–1.2)
Total Protein: 5.8 g/dL — ABNORMAL LOW (ref 6.5–8.1)

## 2024-06-19 LAB — URINALYSIS, ROUTINE W REFLEX MICROSCOPIC
Bilirubin Urine: NEGATIVE
Glucose, UA: NEGATIVE mg/dL
Hgb urine dipstick: NEGATIVE
Ketones, ur: NEGATIVE mg/dL
Leukocytes,Ua: NEGATIVE
Nitrite: NEGATIVE
Protein, ur: NEGATIVE mg/dL
Specific Gravity, Urine: 1.003 — ABNORMAL LOW (ref 1.005–1.030)
pH: 7 (ref 5.0–8.0)

## 2024-06-19 LAB — AMMONIA: Ammonia: 123 umol/L — ABNORMAL HIGH (ref 9–35)

## 2024-06-19 LAB — TSH: TSH: 12.833 u[IU]/mL — ABNORMAL HIGH (ref 0.350–4.500)

## 2024-06-19 MED ORDER — ALBUTEROL SULFATE (2.5 MG/3ML) 0.083% IN NEBU: 2.5000 mg | INHALATION_SOLUTION | RESPIRATORY_TRACT | Status: DC | PRN

## 2024-06-19 MED ORDER — LACTATED RINGERS IV BOLUS
500.0000 mL | Freq: Once | INTRAVENOUS | Status: AC
  Administered 2024-06-19: 500 mL via INTRAVENOUS

## 2024-06-19 MED ORDER — ENSURE PLUS HIGH PROTEIN PO LIQD
237.0000 mL | Freq: Two times a day (BID) | ORAL | Status: DC
  Administered 2024-06-20: 237 mL via ORAL

## 2024-06-19 MED ORDER — RISAQUAD PO CAPS
1.0000 | ORAL_CAPSULE | Freq: Every day | ORAL | Status: DC
  Administered 2024-06-20: 1 via ORAL
  Filled 2024-06-19: qty 1

## 2024-06-19 MED ORDER — POLYETHYLENE GLYCOL 3350 17 G PO PACK
17.0000 g | PACK | Freq: Every day | ORAL | Status: DC
  Administered 2024-06-20: 17 g via ORAL
  Filled 2024-06-19: qty 1

## 2024-06-19 MED ORDER — FUROSEMIDE 40 MG PO TABS: 40.0000 mg | ORAL_TABLET | Freq: Every day | ORAL | Status: DC

## 2024-06-19 MED ORDER — RIFAXIMIN 550 MG PO TABS
550.0000 mg | ORAL_TABLET | Freq: Two times a day (BID) | ORAL | Status: DC
  Administered 2024-06-19 – 2024-06-20 (×2): 550 mg via ORAL
  Filled 2024-06-19 (×3): qty 1

## 2024-06-19 MED ORDER — ADULT MULTIVITAMIN W/MINERALS CH
1.0000 | ORAL_TABLET | Freq: Every day | ORAL | Status: DC
  Administered 2024-06-20: 1 via ORAL
  Filled 2024-06-19: qty 1

## 2024-06-19 MED ORDER — LACTULOSE ENEMA
300.0000 mL | Freq: Once | ORAL | Status: AC
  Administered 2024-06-19: 300 mL via RECTAL
  Filled 2024-06-19: qty 300

## 2024-06-19 MED ORDER — MIDODRINE HCL 5 MG PO TABS
10.0000 mg | ORAL_TABLET | Freq: Three times a day (TID) | ORAL | Status: DC
  Administered 2024-06-20 (×3): 10 mg via ORAL
  Filled 2024-06-19 (×3): qty 2

## 2024-06-19 MED ORDER — LEVOTHYROXINE SODIUM 75 MCG PO TABS
75.0000 ug | ORAL_TABLET | Freq: Every day | ORAL | Status: DC
  Administered 2024-06-20: 75 ug via ORAL
  Filled 2024-06-19: qty 1

## 2024-06-19 MED ORDER — POTASSIUM CHLORIDE 10 MEQ/100ML IV SOLN
10.0000 meq | INTRAVENOUS | Status: AC
  Administered 2024-06-19 (×2): 10 meq via INTRAVENOUS
  Filled 2024-06-19 (×2): qty 100

## 2024-06-19 MED ORDER — FERROUS SULFATE 325 (65 FE) MG PO TABS
325.0000 mg | ORAL_TABLET | Freq: Two times a day (BID) | ORAL | Status: DC
  Administered 2024-06-20 (×2): 325 mg via ORAL
  Filled 2024-06-19 (×2): qty 1

## 2024-06-19 MED ORDER — PANTOPRAZOLE SODIUM 40 MG PO TBEC
40.0000 mg | DELAYED_RELEASE_TABLET | Freq: Two times a day (BID) | ORAL | Status: DC
  Administered 2024-06-19 – 2024-06-20 (×2): 40 mg via ORAL
  Filled 2024-06-19 (×2): qty 1

## 2024-06-19 MED ORDER — SENNA 8.6 MG PO TABS
1.0000 | ORAL_TABLET | Freq: Two times a day (BID) | ORAL | Status: DC
  Administered 2024-06-19 – 2024-06-20 (×2): 8.6 mg via ORAL
  Filled 2024-06-19 (×2): qty 1

## 2024-06-19 MED ORDER — DOCUSATE SODIUM 100 MG PO CAPS
100.0000 mg | ORAL_CAPSULE | Freq: Two times a day (BID) | ORAL | Status: DC
  Administered 2024-06-19 – 2024-06-20 (×2): 100 mg via ORAL
  Filled 2024-06-19 (×2): qty 1

## 2024-06-19 MED ORDER — LACTULOSE ENEMA: 300.0000 mL | Freq: Two times a day (BID) | ORAL | Status: DC | PRN

## 2024-06-19 MED ORDER — LACTULOSE 10 GM/15ML PO SOLN
30.0000 g | Freq: Four times a day (QID) | ORAL | Status: DC
  Administered 2024-06-19 – 2024-06-20 (×4): 30 g via ORAL
  Filled 2024-06-19 (×4): qty 45

## 2024-06-19 NOTE — ED Notes (Signed)
 Provider stated pt could go up without updated temp after 4 attempts made

## 2024-06-19 NOTE — ED Notes (Signed)
 Pt ambulated to the restroom w/2 person assist. Pt had a successful BM and ambulated back to the room. She was assisted to the bed and hooked back up to the cardiac monitor. Husband at bedside. No complaints at this time.

## 2024-06-19 NOTE — H&P (Signed)
 History and Physical    Patient: [name] FMW:[id number] DOB: 10/07/1951 DOA: 06/19/2024 DOS: the patient was seen and examined on 06/19/2024 PCP: [name], NP  Patient coming from: Home  Chief Complaint:  Chief Complaint  Patient presents with   Altered Mental Status   HPI: [name] is a 73 y.o. female with medical history significant of hypothyroidism, esophageal cancer, suspected NASH, noncirrhotic portal hypertension, esophageal varices, multiple admissions for hepatic encephalopathy presented to the emergency department for evaluation of altered mental status.  Patient's husband is the primary historian as she is more sleepy and lethargic.  As per patient's husband she got 105 mL of lactulose , last bowel movement yesterday.  Patient is more confused, lethargic, not acting self.  Husband is primary caretaker states that she did not eat since last night.  Last admission 2 weeks ago for similar presentation. In the emergency department, potassium noted to be 3.0, alk phos 153, albumin  2.6, ALT 50, AST 71, ammonia 123, total bili 1.3.  CT scan of head pending.  Urinalysis pending.   ED provider requested hospitalist admission for further management evaluation of hepatic encephalopathy.  Review of Systems: unable to review all systems due to the inability of the patient to answer questions. Past Medical History:  Diagnosis Date   Acute hepatic encephalopathy (HCC) 03/30/2024   Acute urinary retention 05/04/2022   Allergy 2006 ?   Contrast dye   Arthritis 2016 ??   Knees and thumb   Cancer [facility])    cecum   Cataract 2021   Surgery scheduled July 2023   Colon cancer [facility]) 2003   Elevated liver function tests    Esophageal varices (HCC)    Heart murmur On file   Hemorrhage of gastrointestinal tract 05/04/2011   Hypertension 2021   Hypothyroidism    Iron deficiency anemia    Liver disease    chemotherapy complication, per pt, shunts placed to bypass liver    Malignant neoplasm of cecum (HCC)    Portal hypertension (HCC)    Skin cancer 2019   Splenomegaly    Past Surgical History:  Procedure Laterality Date   COLON SURGERY  2004   Cancer   COSMETIC SURGERY  2021   Skin cancer   ESOPHAGEAL VARICE LIGATION     ESOPHAGOGASTRODUODENOSCOPY N/A 05/28/2024   Procedure: EGD (ESOPHAGOGASTRODUODENOSCOPY);  Surgeon: Albertus Gordy HERO, MD;  Location: [facility];  Service: Gastroenterology;  Laterality: N/A;   EYE SURGERY     HEMICOLECTOMY  01/08/2003   IR RADIOLOGIST EVAL & MGMT  12/20/2020   IR RADIOLOGIST EVAL & MGMT  05/29/2021   LIVER SURGERY     shunts placed after chemo complication   SKIN FULL THICKNESS GRAFT N/A 09/12/2019   Procedure: debridement and FTSG to the nose from left upper arm;  Surgeon: Elisabeth Craig RAMAN, MD;  Location: [facility];  Service: Plastics;  Laterality: N/A;  2 hours, please   TIPS PROCEDURE     Social History:  reports that she has never smoked. She has never used smokeless tobacco. She reports that she does not drink alcohol  and does not use drugs.  Allergies  Allergen Reactions   Contrast Media [Iodinated Contrast Media] Hives    Family History  Problem Relation Age of Onset   Arthritis Mother    Hearing loss Mother    Heart disease Mother    Hypertension Mother    Miscarriages / [place] Mother    Arthritis Father  Diabetes Father    Heart disease Father    Cancer Maternal Aunt    Breast cancer Neg Hx    Colon cancer Neg Hx    Esophageal cancer Neg Hx    Pancreatic cancer Neg Hx    Stomach cancer Neg Hx    Stroke Neg Hx     Prior to Admission medications   Medication Sig Start Date End Date Taking? Authorizing Provider  Accu-Chek Softclix Lancets lancets 3 (three) times daily. 04/05/24   [provider]  aspirin  EC 81 MG tablet Take 1 tablet (81 mg total) by mouth daily. Swallow whole. 12/26/23   Bryn Bernardino NOVAK, MD  Blood Glucose Monitoring Suppl DEVI 1 each by Does not apply  route in the morning, at noon, and at bedtime. May substitute to any manufacturer covered by patient's insurance. 04/05/24   Rojelio Nest, DO  CRANBERRY PO Take 1 tablet by mouth 2 (two) times daily.    [provider]  feeding supplement (ENSURE PLUS HIGH PROTEIN) LIQD Take 237 mLs by mouth 2 (two) times daily between meals. 05/28/24 06/27/24  Willette Adriana LABOR, MD  ferrous sulfate  325 (65 FE) MG tablet Take 1 tablet (325 mg total) by mouth 2 (two) times daily with a meal. 05/28/24 08/26/24  Shahmehdi, Adriana LABOR, MD  furosemide  (LASIX ) 20 MG tablet Take 20-40 mg by mouth See admin instructions. Take 20 mg by mouth in the morning and increase the dose to 40 mg if the blood pressure is firm    [provider]  lactulose  (CHRONULAC ) 10 GM/15ML SOLN enema Place 300 mLs rectally 2 (two) times daily as needed for severe constipation. 06/10/24   Rizwan, Saima, MD  lactulose  (CHRONULAC ) 10 GM/15ML solution Take 45 mLs (30 g total) by mouth 4 (four) times daily. 06/10/24   Rizwan, Saima, MD  levOCARNitine  (CARNITOR ) 330 MG tablet Take 330 mg by mouth See admin instructions. Take 330 mg by mouth with breakfast, between 3-5 PM, and at bedtime 05/23/22   [provider]  levothyroxine  (SYNTHROID ) 50 MCG tablet Take 1.5 tablets (75 mcg total) by mouth daily before breakfast. 06/10/24   Earley Saucer, MD  liver oil-zinc  oxide (DESITIN) 40 % ointment Apply topically daily as needed for irritation. 03/13/23   Pokhrel, Vernal, MD  magnesium  oxide (MAG-OX) 400 MG tablet Take 1 tablet (400 mg total) by mouth daily. Patient taking differently: Take 400 mg by mouth in the morning. 04/05/24   Rojelio Nest, DO  midodrine  (PROAMATINE ) 10 MG tablet Take 1 tablet (10 mg total) by mouth 3 (three) times daily with meals. Patient taking differently: Take 5 mg by mouth See admin instructions. Take 5 mg by mouth with breakfast, between 3-5 PM, and at bedtime 05/29/24 07/28/24  Willette Adriana LABOR, MD  Multiple Vitamin  (MULTI-VITAMIN) tablet Take 1 tablet by mouth in the morning.    [provider]  pantoprazole  (PROTONIX ) 40 MG tablet Take 1 tablet (40 mg total) by mouth 2 (two) times daily. Patient taking differently: Take 40 mg by mouth 2 (two) times daily before a meal. 05/28/24 06/27/24  Shahmehdi, Adriana LABOR, MD  polyethylene glycol (MIRALAX  / GLYCOLAX ) 17 g packet Take 17 g by mouth daily. 06/10/24   Rizwan, Saima, MD  Probiotic Product (PROBIOTIC PEARLS WOMENS) CAPS Take 1 capsule by mouth in the morning.    [provider]  PROTEIN PO Take 1 Bottle by mouth See admin instructions. Fair Life Nutrition Protein Shake- Drink 1 bottle by mouth twice  a day    [provider]  rifaximin  (XIFAXAN ) 550 MG TABS tablet Take 1 tablet (550 mg total) by mouth 2 (two) times daily. Patient taking differently: Take 550 mg by mouth in the morning and at bedtime. 12/25/23   Bryn Bernardino NOVAK, MD    Physical Exam: Vitals:   06/19/24 1516 06/19/24 1523 06/19/24 1530 06/19/24 1800  BP: 130/62  126/66 124/69  Pulse: (!) 59  60 (!) 59  Resp: 16   16  Temp: 98 F (36.7 C)     TempSrc: Oral     SpO2: 96%  97% 95%  Weight:  74.4 kg    Height:  5' 3 (1.6 m)     General - Elderly Caucasian ill female, no apparent distress HEENT - PERRLA, EOMI, atraumatic head, non tender sinuses. Lung - Clear, basal rales, rhonchi, wheezes. Heart - S1, S2 heard, no murmurs, rubs, 1+ pedal edema. Abdomen - Soft, non tender, distended, bowel sounds good Neuro - lethargic, arousable, follows simple commands, non focal exam. Skin - Warm and dry. Data Reviewed:     Latest Ref Rng & Units 06/19/2024    4:07 PM 06/14/2024   11:31 AM 06/09/2024    2:35 PM  CBC  WBC 4.0 - 10.5 K/uL 2.2  3.4  3.1   Hemoglobin 12.0 - 15.0 g/dL 9.6  88.9  89.4   Hematocrit 36.0 - 46.0 % 30.6  33.9  34.2   Platelets 150 - 400 K/uL 93  101.0  103       Latest Ref Rng & Units 06/19/2024    4:07 PM 06/14/2024   11:31 AM 06/08/2024    5:22 AM   BMP  Glucose 70 - 99 mg/dL 874  92  71   BUN 8 - 23 mg/dL 17  15  20    Creatinine 0.44 - 1.00 mg/dL 9.00  9.00  9.06   Sodium 135 - 145 mmol/L 142  141  140   Potassium 3.5 - 5.1 mmol/L 3.0  3.9  3.7   Chloride 98 - 111 mmol/L 104  105  105   CO2 22 - 32 mmol/L 29  32  28   Calcium  8.9 - 10.3 mg/dL 8.8  9.4  8.3    No results found.   Assessment and Plan: 73 year old female medical history significant for cirrhosis due to NASH, portal hypertension, esophageal varices, s/p TIPS, multiple admissions for hepatic encephalopathy presented for evaluation of confusion.  Patient is currently being admitted for hepatic encephalopathy.  Plan: Acute hepatic encephalopathy: Noncirrhotic portal hypertension History of esophageal varices status post TIPS. Presented with confusion, lethargy, ammonia 123, last bowel movement yesterday. Patient will be continued on lactulose , Xifaxan  therapy.  Oral lactulose  therapy if she is more alert otherwise lactulose  enema will be given. Trend ammonia level. Continue to monitor neurochecks. Fall, aspiration precautions. Nursing supportive care. Discussed care plan with husband. He understand and agree.   Hypokalemia- IV kcl supplements ordered in ED. Will recheck K level.  Hypothyroidism: Check TSH. Continue Synthroid  home dose.  Pancytopenia: Due to underlying liver disease. Continue to monitor hemoglobin, platelets, white count.  History of colon cancer stage IIIb-status post hemicolectomy, chemotherapy. Supportive care.  Peptic ulcer disease: Continue Protonix  40 mg twice daily.  CVA-MRI 04/23/2024 noted to have left PCA territory infarct. Continue supportive care. Will resume aspirin  from tomorrow.   Advance Care Planning:   Code Status: Full Code discussed with patient, husband at bedside.   Consults: GI  Family  Communication: Discussed with husband at bedside   Severity of Illness: The appropriate patient status for this patient is  OBSERVATION. Observation status is judged to be reasonable and necessary in order to provide the required intensity of service to ensure the patient's safety. The patient's presenting symptoms, physical exam findings, and initial radiographic and laboratory data in the context of their medical condition is felt to place them at decreased risk for further clinical deterioration. Furthermore, it is anticipated that the patient will be medically stable for discharge from the hospital within 2 midnights of admission.   Author: Concepcion Riser, MD 06/19/2024 6:45 PM  For on call review [email or web address].

## 2024-06-19 NOTE — ED Triage Notes (Signed)
 Patient c/o confusion x5 days.Husband report worsening confusion today. Husband concern with possible encephalopathy again. No report of Nausea and vomiting.

## 2024-06-19 NOTE — ED Provider Notes (Addendum)
 [place] EMERGENCY DEPARTMENT AT [facility] Provider Note   CSN: [id number] Arrival date & time: 06/19/24  1506     Patient presents with: Altered Mental Status   [name] is a 73 y.o. female.   HPI     Pt comes in with cc of ams. Pt has hx of liver cirrhosis with complications such as varices, hepatic encephalopathy.  Hx provided by husband. According to him, patient has been confused over the last 2-3 days. She has had waxing and waning of her symptoms. Pt has been more sleepy, has had difficulty with word finding.  No n/v/f/c. Reduced # of BM.  Medication compliance: taking lactulose  and other meds as prescribed.   Prior to Admission medications   Medication Sig Start Date End Date Taking? Authorizing Provider  Accu-Chek Softclix Lancets lancets 3 (three) times daily. 04/05/24   [provider]  aspirin  EC 81 MG tablet Take 1 tablet (81 mg total) by mouth daily. Swallow whole. 12/26/23   Bryn Bernardino NOVAK, MD  Blood Glucose Monitoring Suppl DEVI 1 each by Does not apply route in the morning, at noon, and at bedtime. May substitute to any manufacturer covered by patient's insurance. 04/05/24   Rojelio Nest, DO  CRANBERRY PO Take 1 tablet by mouth 2 (two) times daily.    [provider]  feeding supplement (ENSURE PLUS HIGH PROTEIN) LIQD Take 237 mLs by mouth 2 (two) times daily between meals. 05/28/24 06/27/24  Willette Adriana LABOR, MD  ferrous sulfate  325 (65 FE) MG tablet Take 1 tablet (325 mg total) by mouth 2 (two) times daily with a meal. 05/28/24 08/26/24  Shahmehdi, Adriana LABOR, MD  furosemide  (LASIX ) 20 MG tablet Take 20-40 mg by mouth See admin instructions. Take 20 mg by mouth in the morning and increase the dose to 40 mg if the blood pressure is firm    [provider]  lactulose  (CHRONULAC ) 10 GM/15ML SOLN enema Place 300 mLs rectally 2 (two) times daily as needed for severe constipation. 06/10/24   Rizwan, Saima, MD  lactulose  (CHRONULAC ) 10  GM/15ML solution Take 45 mLs (30 g total) by mouth 4 (four) times daily. 06/10/24   Rizwan, Saima, MD  levOCARNitine  (CARNITOR ) 330 MG tablet Take 330 mg by mouth See admin instructions. Take 330 mg by mouth with breakfast, between 3-5 PM, and at bedtime 05/23/22   [provider]  levothyroxine  (SYNTHROID ) 50 MCG tablet Take 1.5 tablets (75 mcg total) by mouth daily before breakfast. 06/10/24   Earley Saucer, MD  liver oil-zinc  oxide (DESITIN) 40 % ointment Apply topically daily as needed for irritation. 03/13/23   Pokhrel, Vernal, MD  magnesium  oxide (MAG-OX) 400 MG tablet Take 1 tablet (400 mg total) by mouth daily. Patient taking differently: Take 400 mg by mouth in the morning. 04/05/24   Rojelio Nest, DO  midodrine  (PROAMATINE ) 10 MG tablet Take 1 tablet (10 mg total) by mouth 3 (three) times daily with meals. Patient taking differently: Take 5 mg by mouth See admin instructions. Take 5 mg by mouth with breakfast, between 3-5 PM, and at bedtime 05/29/24 07/28/24  Willette Adriana LABOR, MD  Multiple Vitamin (MULTI-VITAMIN) tablet Take 1 tablet by mouth in the morning.    [provider]  pantoprazole  (PROTONIX ) 40 MG tablet Take 1 tablet (40 mg total) by mouth 2 (two) times daily. Patient taking differently: Take 40 mg by mouth 2 (two) times daily before a meal. 05/28/24 06/27/24  Willette Adriana LABOR, MD  polyethylene glycol (MIRALAX  / GLYCOLAX ) 17 g packet Take 17 g by mouth daily. 06/10/24   Rizwan, Saima, MD  Probiotic Product (PROBIOTIC PEARLS WOMENS) CAPS Take 1 capsule by mouth in the morning.    [provider]  PROTEIN PO Take 1 Bottle by mouth See admin instructions. Fair Life Nutrition Protein Shake- Drink 1 bottle by mouth twice a day    [provider]  rifaximin  (XIFAXAN ) 550 MG TABS tablet Take 1 tablet (550 mg total) by mouth 2 (two) times daily. Patient taking differently: Take 550 mg by mouth in the morning and at bedtime. 12/25/23   Bryn Bernardino NOVAK, MD     Allergies: Contrast media [iodinated contrast media]    Review of Systems  All other systems reviewed and are negative.   Updated Vital Signs BP 126/66   Pulse 60   Temp 98 F (36.7 C) (Oral)   Resp 16   Ht 5' 3 (1.6 m)   Wt 74.4 kg   SpO2 97%   BMI 29.06 kg/m   Physical Exam Vitals and nursing note reviewed.  Constitutional:      Appearance: She is well-developed.     Comments: Confused, somnolent  HENT:     Head: Atraumatic.  Eyes:     General: Scleral icterus present.  Cardiovascular:     Rate and Rhythm: Normal rate.  Pulmonary:     Effort: Pulmonary effort is normal.  Musculoskeletal:     Cervical back: Neck supple.  Skin:    General: Skin is warm and dry.  Neurological:     Mental Status: She is oriented to person, place, and time.     (all labs ordered are listed, but only abnormal results are displayed) Labs Reviewed  COMPREHENSIVE METABOLIC PANEL WITH GFR - Abnormal; Notable for the following components:      Result Value   Potassium 3.0 (*)    Glucose, Bld 125 (*)    Calcium  8.8 (*)    Total Protein 5.8 (*)    Albumin  2.6 (*)    AST 71 (*)    ALT 50 (*)    Alkaline Phosphatase 153 (*)    Total Bilirubin 1.3 (*)    All other components within normal limits  CBC - Abnormal; Notable for the following components:   WBC 2.2 (*)    RBC 3.02 (*)    Hemoglobin 9.6 (*)    HCT 30.6 (*)    MCV 101.3 (*)    RDW 19.9 (*)    Platelets 93 (*)    All other components within normal limits  AMMONIA - Abnormal; Notable for the following components:   Ammonia 123 (*)    All other components within normal limits  URINE CULTURE  URINALYSIS, ROUTINE W REFLEX MICROSCOPIC  TSH    EKG: None  Radiology: No results found.   .Critical Care  Performed by: Charlyn Sora, MD Authorized by: Charlyn Sora, MD   Critical care provider statement:    Critical care time (minutes):  41   Critical care was necessary to treat or prevent imminent or  life-threatening deterioration of the following conditions:  CNS failure or compromise and hepatic failure   Critical care was time spent personally by me on the following activities:  Development of treatment plan with patient or surrogate, discussions with consultants, evaluation of patient's response to treatment, examination of patient, ordering and review of laboratory studies, ordering and review of radiographic studies, ordering and performing treatments and interventions, pulse  oximetry, re-evaluation of patient's condition, review of old charts and obtaining history from patient or surrogate    Medications Ordered in the ED  lactated ringers  bolus 500 mL (has no administration in time range)  lactulose  (CHRONULAC ) enema 200 gm (has no administration in time range)  potassium chloride  10 mEq in 100 mL IVPB (has no administration in time range)                                    Medical Decision Making Amount and/or Complexity of Data Reviewed Labs: ordered.  Risk Prescription drug management. Decision regarding hospitalization.    73 year old patient with pertinent past medical history of liver cirrhosis comes in with chief complaint of acute altered mental status change.  Collateral history provided by husband.   I have reviewed patient's previous records including the medications and reviewed discharge summary from July, May and also January.  Patient has had stroke as well, in January.  She had an MRI again in July that was showing resolution of her stroke that she had in May.  It also appears that with acute stroke, her main complaint was confusion but also headache, word finding difficulty and he balance issues.  Differential diagnosis considered for this patient includes: Most likely this is hepatic encephalopathy.  Other considerations include ICH / Stroke, acute coronary syndrome, Infection - UTI/Pneumonia/soft tissue infection leading to encephalopathy, encephalopathy due  to electrolyte abnormality or drug interactions or toxins or metabolic conditions like adrenal insufficiency, hyperglycemia, paraneoplastic process  Patient has had compliance with medications.  She likely is slightly dehydrated.  It is unclear what triggered this episode.  She has had UTIs in the past, but no fevers, chills.  We will get In-and out catheterization.  Based on initial assessment, higher suspicion for hepatic encephalopathy, patient will be admitted.  Will give lactulose  enema here.  We will get CT scan of the brain to make sure there is no bleeding.  I do not think emergent MRI is indicated.  She is outside of any therapeutic windows.  Plan is to get basic labs and ammonia level, CT head.  Reassessment: I have independently reviewed the following labs: Ammonia is over 100. Mild hypokalemia.  Patient has pancytopenia, likely related to her liver disease.  Final diagnoses:  Hepatic encephalopathy (HCC)  Somnolence  Acute hypokalemia    ED Discharge Orders     None         Charlyn Sora, MD 06/19/24 1747

## 2024-06-19 NOTE — ED Notes (Signed)
 ED TO INPATIENT HANDOFF REPORT  Name/Age/Gender [name] 73 y.o. female  Code Status    Code Status Orders  (From admission, onward)           Start     Ordered   06/19/24 1844  Full code  Continuous       Question:  By:  Answer:  Consent: discussion documented in EHR   06/19/24 1844           Code Status History     Date Active Date Inactive Code Status Order ID Comments User Context   06/07/2024 1647 06/10/2024 1604 Full Code [id number]  Sonjia Held, MD ED   05/20/2024 2133 05/29/2024 1804 Full Code [id number]  Marcene Eva NOVAK, DO ED   04/15/2024 1435 04/16/2024 2226 Full Code [id number]  Seena Marsa NOVAK, MD ED   03/30/2024 1840 04/05/2024 1836 Full Code [id number]  Arlice Reichert, MD ED   12/21/2023 1729 12/25/2023 1957 Full Code [id number]  Celinda Alm Lot, MD ED   04/21/2023 2058 04/27/2023 2056 Full Code [id number]  Kassie Acquanetta Bradley, MD ED   02/26/2023 0034 03/13/2023 1642 Full Code [id number]  Shona Terry SAILOR, DO Inpatient   05/01/2022 0256 05/05/2022 2014 Full Code [id number]  Rockey Denece LABOR, DO ED   02/02/2022 2128 02/05/2022 1612 Full Code [id number]  Seena Marsa NOVAK, MD ED   11/28/2020 1614 11/30/2020 2005 Full Code [id number]  Caleen Qualia, MD ED   08/12/2020 2328 08/18/2020 0130 Full Code [id number]  Charlton Evalene RAMAN, MD ED   03/12/2020 0343 03/15/2020 0119 Full Code [id number]  Alfornia Madison, MD ED       Home/SNF/Other Home  Chief Complaint Hepatic encephalopathy (HCC) [K76.82]  Level of Care/Admitting Diagnosis ED Disposition     ED Disposition  Admit   Condition  --   Comment  Hospital Area: [facility] [[id number]]  Level of Care: Med-Surg [16]  May place patient in observation at [facility] or [facility] if equivalent level of care is available:: Yes  Covid Evaluation: Asymptomatic - no recent exposure (last 10 days) testing not required  Diagnosis: Hepatic encephalopathy (HCC) [572.2.ICD-9-CM]  Admitting Physician: DARCI PORE [[id number]]  Attending Physician: DARCI PORE [[id number]]  For patients discharging to extended facilities (i.e. SNF, AL, group homes or LTAC) initiate:: Discharge to SNF/Facility Placement COVID-19 Lab Testing Protocol          Medical History Past Medical History:  Diagnosis Date   Acute hepatic encephalopathy (HCC) 03/30/2024   Acute urinary retention 05/04/2022   Allergy 2006 ?   Contrast dye   Arthritis 2016 ??   Knees and thumb   Cancer [facility])    cecum   Cataract 2021   Surgery scheduled July 2023   Colon cancer [facility]) 2003   Elevated liver function tests    Esophageal varices (HCC)    Heart murmur On file   Hemorrhage of gastrointestinal tract 05/04/2011   Hypertension 2021   Hypothyroidism    Iron deficiency anemia    Liver disease    chemotherapy complication, per pt, shunts placed to bypass liver   Malignant neoplasm of cecum (HCC)    Portal hypertension (HCC)    Skin cancer 2019   Splenomegaly     Allergies Allergies  Allergen Reactions   Contrast Media [Iodinated Contrast Media] Hives    IV Location/Drains/Wounds Patient Lines/Drains/Airways Status     Active Line/Drains/Airways     Name Placement date Placement time Site Days   Peripheral  IV 06/19/24 20 G 1 Posterior;Right Hand 06/19/24  1615  Hand  less than 1            Labs/Imaging Results for orders placed or performed during the hospital encounter of 06/19/24 (from the past 48 hours)  Comprehensive metabolic panel     Status: Abnormal   Collection Time: 06/19/24  4:07 PM  Result Value Ref Range   Sodium 142 135 - 145 mmol/L   Potassium 3.0 (L) 3.5 - 5.1 mmol/L   Chloride 104 98 - 111 mmol/L   CO2 29 22 - 32 mmol/L   Glucose, Bld 125 (H) 70 - 99 mg/dL    Comment: Glucose reference range applies only to samples taken after fasting for at least 8 hours.   BUN 17 8 - 23 mg/dL   Creatinine, Ser 9.00 0.44 - 1.00 mg/dL   Calcium  8.8 (L) 8.9 - 10.3 mg/dL   Total  Protein 5.8 (L) 6.5 - 8.1 g/dL   Albumin  2.6 (L) 3.5 - 5.0 g/dL   AST 71 (H) 15 - 41 U/L   ALT 50 (H) 0 - 44 U/L   Alkaline Phosphatase 153 (H) 38 - 126 U/L   Total Bilirubin 1.3 (H) 0.0 - 1.2 mg/dL   GFR, Estimated >39 >39 mL/min    Comment: (NOTE) Calculated using the CKD-EPI Creatinine Equation (2021)    Anion gap 9 5 - 15    Comment: Performed at [facility], [address] [place], [place] [address]  CBC     Status: Abnormal   Collection Time: 06/19/24  4:07 PM  Result Value Ref Range   WBC 2.2 (L) 4.0 - 10.5 K/uL   RBC 3.02 (L) 3.87 - 5.11 MIL/uL   Hemoglobin 9.6 (L) 12.0 - 15.0 g/dL   HCT 69.3 (L) 63.9 - 53.9 %   MCV 101.3 (H) 80.0 - 100.0 fL   MCH 31.8 26.0 - 34.0 pg   MCHC 31.4 30.0 - 36.0 g/dL   RDW 80.0 (H) 88.4 - 84.4 %   Platelets 93 (L) 150 - 400 K/uL    Comment: SPECIMEN CHECKED FOR CLOTS REPEATED TO VERIFY PLATELET COUNT CONFIRMED BY SMEAR    nRBC 0.0 0.0 - 0.2 %    Comment: Performed at [facility], [address] [place], [place] [address]  Ammonia     Status: Abnormal   Collection Time: 06/19/24  4:07 PM  Result Value Ref Range   Ammonia 123 (H) 9 - 35 umol/L    Comment: Performed at [facility], [address], [place], [place] [address]   No results found.  Pending Labs Unresulted Labs (From admission, onward)     Start     Ordered   06/20/24 0500  Ammonia  Tomorrow morning,   R        06/19/24 1902   06/19/24 1737  Urine Culture  Once,   URGENT       Question:  Indication  Answer:  Altered mental status (if no other cause identified)   06/19/24 1737   06/19/24 1653  TSH  Once,   URGENT        06/19/24 1653   06/19/24 1527  Urinalysis, Routine w reflex microscopic -Urine, Clean Catch  Once,   URGENT       Question:  Specimen Source  Answer:  Urine, Clean Catch   06/19/24 1526   Signed and Held  Basic metabolic panel  Tomorrow morning,  R        Signed and Held   Signed and Held   CBC  Tomorrow morning,   R        Signed and Held            Vitals/Pain Today's Vitals   06/19/24 1516 06/19/24 1523 06/19/24 1530 06/19/24 1800  BP: 130/62  126/66 124/69  Pulse: (!) 59  60 (!) 59  Resp: 16   16  Temp: 98 F (36.7 C)     TempSrc: Oral     SpO2: 96%  97% 95%  Weight:  74.4 kg    Height:  5' 3 (1.6 m)    PainSc:  0-No pain      Isolation Precautions No active isolations  Medications Medications  potassium chloride  10 mEq in 100 mL IVPB (10 mEq Intravenous New Bag/Given 06/19/24 1909)  lactated ringers  bolus 500 mL (500 mLs Intravenous New Bag/Given 06/19/24 1805)  lactulose  (CHRONULAC ) enema 200 gm (300 mLs Rectal Given 06/19/24 1818)    Mobility walks with person assist

## 2024-06-20 DIAGNOSIS — K766 Portal hypertension: Secondary | ICD-10-CM | POA: Diagnosis not present

## 2024-06-20 DIAGNOSIS — D696 Thrombocytopenia, unspecified: Secondary | ICD-10-CM

## 2024-06-20 DIAGNOSIS — I480 Paroxysmal atrial fibrillation: Secondary | ICD-10-CM

## 2024-06-20 DIAGNOSIS — K7682 Hepatic encephalopathy: Secondary | ICD-10-CM | POA: Diagnosis not present

## 2024-06-20 DIAGNOSIS — D649 Anemia, unspecified: Secondary | ICD-10-CM | POA: Diagnosis not present

## 2024-06-20 DIAGNOSIS — K7460 Unspecified cirrhosis of liver: Secondary | ICD-10-CM

## 2024-06-20 DIAGNOSIS — E039 Hypothyroidism, unspecified: Secondary | ICD-10-CM | POA: Diagnosis not present

## 2024-06-20 LAB — CBC
HCT: 28.3 % — ABNORMAL LOW (ref 36.0–46.0)
Hemoglobin: 9 g/dL — ABNORMAL LOW (ref 12.0–15.0)
MCH: 32.5 pg (ref 26.0–34.0)
MCHC: 31.8 g/dL (ref 30.0–36.0)
MCV: 102.2 fL — ABNORMAL HIGH (ref 80.0–100.0)
Platelets: 87 K/uL — ABNORMAL LOW (ref 150–400)
RBC: 2.77 MIL/uL — ABNORMAL LOW (ref 3.87–5.11)
RDW: 20 % — ABNORMAL HIGH (ref 11.5–15.5)
WBC: 2.2 K/uL — ABNORMAL LOW (ref 4.0–10.5)
nRBC: 0 % (ref 0.0–0.2)

## 2024-06-20 LAB — HEPATIC FUNCTION PANEL
ALT: 46 U/L — ABNORMAL HIGH (ref 0–44)
AST: 63 U/L — ABNORMAL HIGH (ref 15–41)
Albumin: 2.4 g/dL — ABNORMAL LOW (ref 3.5–5.0)
Alkaline Phosphatase: 130 U/L — ABNORMAL HIGH (ref 38–126)
Bilirubin, Direct: 0.2 mg/dL (ref 0.0–0.2)
Indirect Bilirubin: 1 mg/dL — ABNORMAL HIGH (ref 0.3–0.9)
Total Bilirubin: 1.2 mg/dL (ref 0.0–1.2)
Total Protein: 5.2 g/dL — ABNORMAL LOW (ref 6.5–8.1)

## 2024-06-20 LAB — BASIC METABOLIC PANEL WITH GFR
Anion gap: 9 (ref 5–15)
BUN: 15 mg/dL (ref 8–23)
CO2: 26 mmol/L (ref 22–32)
Calcium: 8.5 mg/dL — ABNORMAL LOW (ref 8.9–10.3)
Chloride: 108 mmol/L (ref 98–111)
Creatinine, Ser: 0.71 mg/dL (ref 0.44–1.00)
GFR, Estimated: >60 mL/min (ref >60)
Glucose, Bld: 83 mg/dL (ref 70–99)
Potassium: 2.8 mmol/L — ABNORMAL LOW (ref 3.5–5.1)
Sodium: 143 mmol/L (ref 135–145)

## 2024-06-20 LAB — T4, FREE: Free T4: 0.75 ng/dL (ref 0.61–1.12)

## 2024-06-20 LAB — POTASSIUM: Potassium: 4.1 mmol/L (ref 3.5–5.1)

## 2024-06-20 LAB — AMMONIA: Ammonia: 54 umol/L — ABNORMAL HIGH (ref 9–35)

## 2024-06-20 MED ORDER — MIDODRINE HCL 10 MG PO TABS: 10.0000 mg | ORAL_TABLET | Freq: Three times a day (TID) | ORAL | 0 refills | Status: AC

## 2024-06-20 MED ORDER — POTASSIUM CHLORIDE 10 MEQ/100ML IV SOLN
10.0000 meq | INTRAVENOUS | Status: AC
  Administered 2024-06-20 (×4): 10 meq via INTRAVENOUS
  Filled 2024-06-20: qty 100

## 2024-06-20 MED ORDER — POTASSIUM CHLORIDE 20 MEQ PO PACK
40.0000 meq | PACK | Freq: Every day | ORAL | Status: DC
  Administered 2024-06-20: 40 meq via ORAL
  Filled 2024-06-20: qty 2

## 2024-06-20 MED ORDER — ONDANSETRON HCL 4 MG/2ML IJ SOLN
4.0000 mg | Freq: Three times a day (TID) | INTRAMUSCULAR | Status: DC | PRN
  Administered 2024-06-20: 4 mg via INTRAVENOUS
  Filled 2024-06-20: qty 2

## 2024-06-20 MED ORDER — ENSURE PLUS HIGH PROTEIN PO LIQD: 237.0000 mL | Freq: Two times a day (BID) | ORAL | 0 refills | Status: AC

## 2024-06-20 MED ORDER — SPIRONOLACTONE 25 MG PO TABS
50.0000 mg | ORAL_TABLET | Freq: Every day | ORAL | Status: DC
  Administered 2024-06-20: 50 mg via ORAL
  Filled 2024-06-20: qty 2

## 2024-06-20 MED ORDER — POTASSIUM CHLORIDE CRYS ER 20 MEQ PO TBCR: 20.0000 meq | EXTENDED_RELEASE_TABLET | Freq: Every day | ORAL | 0 refills | Status: DC

## 2024-06-20 MED ORDER — SPIRONOLACTONE 50 MG PO TABS: 50.0000 mg | ORAL_TABLET | Freq: Every day | ORAL | 1 refills | Status: DC

## 2024-06-20 NOTE — Plan of Care (Signed)

## 2024-06-20 NOTE — Progress Notes (Signed)
   06/20/24 1605  TOC Brief Assessment  Insurance and Status Reviewed  Patient has primary care physician Yes Stacie, Lynwood HERO, NP)  Home environment has been reviewed Home with Husband  Prior level of function: Independent  Prior/Current Home Services No current home services  Social Drivers of Health Review SDOH reviewed no interventions necessary  Readmission risk has been reviewed Yes  Transition of care needs no transition of care needs at this time

## 2024-06-20 NOTE — Discharge Summary (Signed)
 Physician Discharge Summary   Patient: [name] MRN: [id number] DOB: 06/26/1951  Admit date:     06/19/2024  Discharge date: 06/20/24  Discharge Physician: Concepcion Riser   PCP: [name], NP   Recommendations at discharge:  {Tip this will not be part of the note when signed- Example include specific recommendations for outpatient follow-up, pending tests to follow-up on. (Optional):26781}  PCP follow up in 1 week. GI follow up as scheduled.  Discharge Diagnoses: Principal Problem:   Hepatic encephalopathy (HCC) Active Problems:   Cirrhosis (HCC)   Acquired hypothyroidism   Portal hypertension (HCC)   Thrombocytopenia (HCC)   Paroxysmal atrial fibrillation (HCC)   Acute on chronic anemia  Resolved Problems:   * No resolved hospital problems. [facility] Course: No notes on file  Assessment and Plan: No notes have been filed under this hospital service. Service: Hospitalist     {Tip this will not be part of the note when signed Body mass index is 29.06 kg/m. , ,  (Optional):26781}  {(NOTE) Pain control PDMP Statment (Optional):26782} Consultants: none Procedures performed: none  Disposition: Home Diet recommendation:  Discharge Diet Orders (From admission, onward)     Start     Ordered   06/20/24 0000  Diet - low sodium heart healthy        06/20/24 1657           Cardiac diet DISCHARGE MEDICATION: Allergies as of 06/20/2024       Reactions   Contrast Media [iodinated Contrast Media] Hives        Medication List     TAKE these medications    Accu-Chek Softclix Lancets lancets 3 (three) times daily.   aspirin  EC 81 MG tablet Take 1 tablet (81 mg total) by mouth daily. Swallow whole.   Blood Glucose Monitoring Suppl Devi 1 each by Does not apply route in the morning, at noon, and at bedtime. May substitute to any manufacturer covered by patient's insurance.   CRANBERRY PO Take 1 tablet by mouth 2 (two) times daily.    feeding supplement Liqd Take 237 mLs by mouth 2 (two) times daily between meals.   ferrous sulfate  325 (65 FE) MG tablet Take 1 tablet (325 mg total) by mouth 2 (two) times daily with a meal.   furosemide  20 MG tablet Commonly known as: LASIX  Take 20-40 mg by mouth See admin instructions. Take 40 mg by mouth every morning. Decrease the dose to 20 mg if the blood pressure is low.   lactulose  10 GM/15ML Soln enema Commonly known as: CHRONULAC  Place 300 mLs rectally 2 (two) times daily as needed for severe constipation.   lactulose  10 GM/15ML solution Commonly known as: CHRONULAC  Take 45 mLs (30 g total) by mouth 4 (four) times daily.   levOCARNitine  330 MG tablet Commonly known as: CARNITOR  Take 330 mg by mouth See admin instructions. Take 330 mg by mouth with breakfast, between 3-5 PM, and at bedtime   levothyroxine  50 MCG tablet Commonly known as: SYNTHROID  Take 1.5 tablets (75 mcg total) by mouth daily before breakfast.   liver oil-zinc  oxide 40 % ointment Commonly known as: DESITIN Apply topically daily as needed for irritation.   magnesium  oxide 400 MG tablet Commonly known as: MAG-OX Take 1 tablet (400 mg total) by mouth daily. What changed: when to take this   midodrine  10 MG tablet Commonly known as: PROAMATINE  Take 1 tablet (10 mg total) by mouth 3 (three) times daily with meals. What  changed:  when to take this additional instructions   Multi-Vitamin tablet Take 1 tablet by mouth in the morning.   pantoprazole  40 MG tablet Commonly known as: PROTONIX  Take 1 tablet (40 mg total) by mouth 2 (two) times daily. What changed: when to take this   polyethylene glycol 17 g packet Commonly known as: MIRALAX  / GLYCOLAX  Take 17 g by mouth daily.   potassium chloride  SA 20 MEQ tablet Commonly known as: KLOR-CON  M Take 1 tablet (20 mEq total) by mouth daily.   PREPARATION H RE Place 1 Dose rectally as needed (hemorrhoids).   Probiotic Pearls Womens Caps Take  1 capsule by mouth in the morning.   PROTEIN PO Take 1 Bottle by mouth See admin instructions. Fair Life Nutrition Protein Shake- Drink 1 bottle by mouth daily if she can stomach it.   rifaximin  550 MG Tabs tablet Commonly known as: XIFAXAN  Take 1 tablet (550 mg total) by mouth 2 (two) times daily. What changed: when to take this   spironolactone  50 MG tablet Commonly known as: ALDACTONE  Take 1 tablet (50 mg total) by mouth daily. Start taking on: June 21, 2024        Discharge Exam: [name]   06/19/24 1523  Weight: 74.4 kg   ***  Condition at discharge: stable  The results of significant diagnostics from this hospitalization (including imaging, microbiology, ancillary and laboratory) are listed below for reference.   Imaging Studies: CT Head Wo Contrast Result Date: 06/19/2024 CLINICAL DATA:  Mental status change, unknown cause EXAM: CT HEAD WITHOUT CONTRAST TECHNIQUE: Contiguous axial images were obtained from the base of the skull through the vertex without intravenous contrast. RADIATION DOSE REDUCTION: This exam was performed according to the departmental dose-optimization program which includes automated exposure control, adjustment of the mA and/or kV according to patient size and/or use of iterative reconstruction technique. COMPARISON:  CT head 05/24/2024. FINDINGS: Brain: No evidence of acute infarction, hemorrhage, hydrocephalus, extra-axial collection or mass lesion/mass effect. Patchy white matter hypodensities appears similar and are compatible with chronic microvascular ischemic disease. Prior left PCA territory infarct appears similar. Vascular: No hyperdense vessel or unexpected calcification. Skull: Normal. Negative for fracture or focal lesion. Sinuses/Orbits: No acute finding. IMPRESSION: 1. No evidence of acute intracranial abnormality. 2. Similar prior left PCA territory infarct and chronic microvascular ischemic change. Electronically Signed   By:  Gilmore GORMAN Molt M.D.   On: 06/19/2024 22:14   US  ASCITES (ABDOMEN LIMITED) Result Date: 06/07/2024 EXAM: LIMITED ABDOMINAL ULTRASOUND FOR ASCITES EVALUATION TECHNIQUE: Limited real-time sonography of all 4 quadrants of the abdomen was performed for evaluation of ascites. COMPARISON: None. CLINICAL HISTORY: 356290 Ascites 356290. Ascites. FINDINGS: RIGHT UPPER QUADRANT: No ascites seen. Incidental note of cholelithiasis. LEFT UPPER QUADRANT: No ascites seen. RIGHT LOWER QUADRANT: No ascites seen. LEFT LOWER QUADRANT: No ascites seen. OTHER: Limited visualization of the rest of the abdomen demonstrates no acute abnormality. IMPRESSION: 1. No significant ascites. Electronically signed by: Norman Gatlin MD 06/07/2024 05:47 PM EDT RP Workstation: [id number]   CARDIAC EVENT MONITOR Result Date: 06/04/2024   Patient had a minimum heart rate of 44 bpm, maximum heart rate of 114 bpm, and average heart rate of 69 bpm. Predominant underlying rhythm was sinus rhythm. Paroxsymal atrial fibrillation, 1% burden, longest event was over 6 hours Isolated PACs were rare (<1.0%). Isolated PVCs were rare (<1.0%). No triggered and diary events. Asymptomatic paroxsymal atrial fibrillation.  US  ABDOMEN LIMITED WITH LIVER DOPPLER Result Date: 05/26/2024 CLINICAL DATA:  Cirrhosis with  ascites. EXAM: DUPLEX ULTRASOUND OF LIVER TECHNIQUE: Color and duplex Doppler ultrasound was performed to evaluate the hepatic in-flow and out-flow vessels. COMPARISON:  Ultrasound 03/31/2024 FINDINGS: Liver: Coarsened hepatic echotexture No focal lesion, mass or intrahepatic biliary ductal dilatation. Gallbladder: No sonographic Murphy's sign. No wall thickening. Cholelithiasis. Portal Vein Velocities Main Prox:  21.9 cm/sec; previously 70 centimeters/second Right: 30.0 cm/sec; previously says 71 centimeters/second Left: 27.6 cm/sec; previously 41 centimeters/second TIPS Proximal (portal): 53.9 cm/s;  previously 74 cm/s MID: 57.1 cm/s; previously 78  cm/s Distal (hepatic vein): 36.4 cm/s; previously 96 cm/s Hepatic Vein Velocities Right:  16.9 cm/sec; Middle:  20.8 cm/sec; Left:  15.8 cm/sec; IVC: Present and patent with normal respiratory phasicity. Hepatic Artery Velocity:  63.2 cm/sec Splenic Vein Velocity:  24.9 cm/sec Spleen: 11.2 cm x 4.6 cm x 3.5 cm Portal Vein Occlusion/Thrombus: No Splenic Vein Occlusion/Thrombus: No Ascites: None Varices: None IMPRESSION: 1. Patent TIPS. Decreased velocities in the portal vein and tips compared to prior 2. Cholelithiasis. Electronically Signed   By: Norman Gatlin M.D.   On: 05/26/2024 00:03   US  EKG SITE RITE Result Date: 05/25/2024 If Site Rite image not attached, placement could not be confirmed due to current cardiac rhythm.  CT HEAD WO CONTRAST ( ) Result Date: 05/24/2024 CLINICAL DATA:  Altered mental status. EXAM: CT HEAD WITHOUT CONTRAST TECHNIQUE: Contiguous axial images were obtained from the base of the skull through the vertex without intravenous contrast. RADIATION DOSE REDUCTION: This exam was performed according to the departmental dose-optimization program which includes automated exposure control, adjustment of the mA and/or kV according to patient size and/or use of iterative reconstruction technique. COMPARISON:  Same day MRI head.  CT head 05/20/2024. FINDINGS: Brain: No acute intracranial hemorrhage. Evolving left PCA territory infarct. No additional areas of infarct appreciated on CT. Nonspecific hypoattenuation in the periventricular and subcortical white matter favored to reflect chronic microvascular ischemic changes. No edema, mass effect, or midline shift. The basilar cisterns are patent. No extra-axial fluid collections. Ventricles: The ventricles are normal. Vascular: No hyperdense vessel or unexpected calcification. Skull: No acute or aggressive finding. Orbits: Bilateral lens replacement.  Orbits otherwise unremarkable. Sinuses: The visualized paranasal sinuses are clear. Other:  Mastoid air cells are clear. IMPRESSION: Evolving left PCA territory infarct. No evidence of hemorrhagic conversion. Chronic microvascular ischemic changes. Electronically Signed   By: Donnice Mania M.D.   On: 05/24/2024 21:58   MR BRAIN WO CONTRAST Result Date: 05/24/2024 CLINICAL DATA:  Mental status change, unknown cause EXAM: MRI HEAD WITHOUT CONTRAST TECHNIQUE: Multiplanar, multiecho pulse sequences of the brain and surrounding structures were obtained without intravenous contrast. COMPARISON:  MRI head April 23, 2024. FINDINGS: Moderately motion limited study.  Within this limitation: Brain: Evolution of previously seen left PCA territory infarcts which demonstrates decreased conspicuity of restricted diffusion and decreased edema. No evidence of new/interval acute infarct on motion limited assessment. No evidence of acute hemorrhage, mass lesion, midline shift or hydrocephalus. Similar remote right thalamic lacunar infarct and advanced scattered T2/FLAIR hyperintensities in the white matter, compatible with chronic microvascular ischemic disease. Vascular: Major arterial flow voids are maintained skull base. Skull and upper cervical spine: Normal marrow signal. Sinuses/Orbits: Negative. Other: None. IMPRESSION: 1. Expected evolution of left PCA infarcts without evidence of new/interval acute infarct, hemorrhage or progressive mass effect. 2. Similar advanced chronic microvascular ischemic disease. Electronically Signed   By: Gilmore GORMAN Molt M.D.   On: 05/24/2024 20:10   EEG adult Result Date: 05/24/2024 Shelton Arlin KIDD, MD  05/24/2024  5:38 PM Patient Name: Artrice Kraker MRN: [id number] Epilepsy Attending: Arlin MALVA Krebs Referring Physician/Provider: Michaela Aisha SQUIBB, MD Date: 05/24/2024 Duration: 25.07 mins Patient history: 73yo F with ams. EEG to evaluate for seizure. Level of alertness: Awake AEDs during EEG study: None Technical aspects: This EEG study was done with scalp electrodes  positioned according to the 10-20 International system of electrode placement. Electrical activity was reviewed with band pass filter of 1-70Hz , sensitivity of 7 uV/mm, display speed of 3mm/sec with a 60Hz  notched filter applied as appropriate. EEG data were recorded continuously and digitally stored.  Video monitoring was available and reviewed as appropriate. Description: The posterior dominant rhythm consists of 7.5 Hz activity of moderate voltage (25-35 uV) seen predominantly in posterior head regions, symmetric and reactive to eye opening and eye closing. EEG showed continuous generalized predominantly 5 to 6 Hz theta slowing admixed with intermittent 2-3hz  delta slowing. Hyperventilation and photic stimulation were not performed.    ABNORMALITY - Continuous slow, generalized  IMPRESSION: This study is suggestive of mild to moderate diffuse encephalopathy. No seizures or epileptiform discharges were seen throughout the recording.  Arlin MALVA Krebs    Microbiology: Results for orders placed or performed during the hospital encounter of 05/20/24  MRSA Next Gen by PCR, Nasal     Status: None   Collection Time: 05/21/24  1:27 PM   Specimen: Nasal Mucosa; Nasal Swab  Result Value Ref Range Status   MRSA by PCR Next Gen NOT DETECTED NOT DETECTED Final    Comment: (NOTE) The GeneXpert MRSA Assay (FDA approved for NASAL specimens only), is one component of a comprehensive MRSA colonization surveillance program. It is not intended to diagnose MRSA infection nor to guide or monitor treatment for MRSA infections. Test performance is not FDA approved in patients less than 57 years old. Performed at [facility], [address], [place], [place] [address]     Labs: CBC: Recent Labs  Lab 06/14/24 1131 06/19/24 1607 06/20/24 0534  WBC 3.4* 2.2* 2.2*  HGB 11.0* 9.6* 9.0*  HCT 33.9* 30.6* 28.3*  MCV 98.1 101.3* 102.2*  PLT 101.0* 93* 87*   Basic Metabolic Panel: Recent Labs   Lab 06/14/24 1131 06/19/24 1607 06/20/24 0534 06/20/24 1550  NA 141 142 143  --   K 3.9 3.0* 2.8* 4.1  CL 105 104 108  --   CO2 32 29 26  --   GLUCOSE 92 125* 83  --   BUN 15 17 15   --   CREATININE 0.99 0.99 0.71  --   CALCIUM  9.4 8.8* 8.5*  --    Liver Function Tests: Recent Labs  Lab 06/14/24 1131 06/19/24 1607 06/20/24 0534  AST 82* 71* 63*  ALT 54* 50* 46*  ALKPHOS 185* 153* 130*  BILITOT 1.1 1.3* 1.2  PROT 6.1 5.8* 5.2*  ALBUMIN  3.1* 2.6* 2.4*   CBG: No results for input(s): GLUCAP in the last 168 hours.  Discharge time spent: 34 minutes.  Signed: Concepcion Riser, MD [facility] 06/20/2024

## 2024-06-20 NOTE — Plan of Care (Signed)
Discuss and review plan of care with patient/family  

## 2024-06-20 NOTE — Care Management Obs Status (Signed)
 MEDICARE OBSERVATION STATUS NOTIFICATION   Patient Details  Name: Celest Reitz MRN: [id number] Date of Birth: 01/10/1951   Medicare Observation Status Notification Given:  Yes    Toy LITTIE Agar, RN 06/20/2024, 3:56 PM

## 2024-06-20 NOTE — Evaluation (Signed)
 Physical Therapy Evaluation Patient Details Name: [name] MRN: [id number] DOB: 1951/03/21 Today's Date: 06/20/2024  History of Present Illness  73 year old female presented for evaluation of confusion. Past medical history significant for cirrhosis due to NASH, portal hypertension, esophageal varices, s/p TIPS, multiple admissions for hepatic encephalopathy, acute CVA 03/2024.  Clinical Impression  Pt admitted with above diagnosis. Pt is mobilizing well, she ambulated 140' with RW, no loss of balance, HR 76 with walking. Pt has 24/7 assist at home from her spouse. She is ready to DC home from a PT standpoint.  Pt currently with functional limitations due to the deficits listed below (see PT Problem List). Pt will benefit from acute skilled PT to increase their independence and safety with mobility to allow discharge.           If plan is discharge home, recommend the following: A little help with bathing/dressing/bathroom;Assistance with cooking/housework;Assist for transportation;Help with stairs or ramp for entrance   Can travel by private vehicle        Equipment Recommendations None recommended by PT  Recommendations for Other Services       Functional Status Assessment Patient has had a recent decline in their functional status and demonstrates the ability to make significant improvements in function in a reasonable and predictable amount of time.     Precautions / Restrictions Precautions Precautions: Fall Recall of Precautions/Restrictions: Intact Precaution/Restrictions Comments: denies falls in past 6 months Restrictions Weight Bearing Restrictions Per Provider Order: No      Mobility  Bed Mobility Overal bed mobility: Modified Independent Bed Mobility: Supine to Sit     Supine to sit: Modified independent (Device/Increase time), HOB elevated, Used rails     General bed mobility comments: Increased time    Transfers Overall transfer level: Needs  assistance Equipment used: Rolling walker (2 wheels) Transfers: Sit to/from Stand Sit to Stand: Supervision           General transfer comment: good hand placement    Ambulation/Gait Ambulation/Gait assistance: Supervision Gait Distance (Feet): 140 Feet Assistive device: Rolling walker (2 wheels) Gait Pattern/deviations: Step-through pattern, Decreased stride length Gait velocity: decreased     General Gait Details: slight trunk flexion, slow but steady with RW, distance limited by fatigue  Stairs            Wheelchair Mobility     Tilt Bed    Modified Rankin (Stroke Patients Only)       Balance Overall balance assessment: Needs assistance Sitting-balance support: Bilateral upper extremity supported Sitting balance-Leahy Scale: Good     Standing balance support: No upper extremity supported, Bilateral upper extremity supported, Reliant on assistive device for balance Standing balance-Leahy Scale: Good Standing balance comment: steady without UE support statically                             Pertinent Vitals/Pain Pain Assessment Pain Assessment: No/denies pain    Home Living Family/patient expects to be discharged to:: Private residence Living Arrangements: Spouse/significant other Available Help at Discharge: Family;Available 24 hours/day Type of Home: House Home Access: Ramped entrance       Home Layout: One level Home Equipment: Rolling Walker (2 wheels);BSC/3in1;Cane - single point;Wheelchair - manual;Hospital bed;Tub bench Additional Comments: walker basket.    Prior Function Prior Level of Function : Independent/Modified Independent;Needs assist  Cognitive Assist : ADLs (cognitive)           Mobility Comments: mod I with  intermittent use SPC for household and community navigation ADLs Comments: varies depending on cognition; typically pt is MOD I, spouse manages medications and bills     Extremity/Trunk Assessment    Upper Extremity Assessment Upper Extremity Assessment: Overall WFL for tasks assessed    Lower Extremity Assessment Lower Extremity Assessment: Overall WFL for tasks assessed    Cervical / Trunk Assessment Cervical / Trunk Assessment: Normal  Communication   Communication Communication: No apparent difficulties    Cognition Arousal: Alert Behavior During Therapy: WFL for tasks assessed/performed   PT - Cognitive impairments: No apparent impairments                         Following commands: Intact       Cueing Cueing Techniques: Verbal cues     General Comments      Exercises     Assessment/Plan    PT Assessment Patient needs continued PT services  PT Problem List Decreased activity tolerance       PT Treatment Interventions Patient/family education;Therapeutic exercise;Gait training    PT Goals (Current goals can be found in the Care Plan section)  Acute Rehab PT Goals Patient Stated Goal: get stronger PT Goal Formulation: With patient/family Time For Goal Achievement: 07/04/24 Potential to Achieve Goals: Good    Frequency Min 3X/week     Co-evaluation               AM-PAC PT 6 Clicks Mobility  Outcome Measure Help needed turning from your back to your side while in a flat bed without using bedrails?: None Help needed moving from lying on your back to sitting on the side of a flat bed without using bedrails?: A Little Help needed moving to and from a bed to a chair (including a wheelchair)?: None Help needed standing up from a chair using your arms (e.g., wheelchair or bedside chair)?: None Help needed to walk in hospital room?: None Help needed climbing 3-5 steps with a railing? : A Little 6 Click Score: 22    End of Session Equipment Utilized During Treatment: Gait belt Activity Tolerance: Patient tolerated treatment well Patient left: in chair;with call bell/phone within reach;with family/visitor present Nurse Communication:  Mobility status PT Visit Diagnosis: Difficulty in walking, not elsewhere classified (R26.2);Muscle weakness (generalized) (M62.81)    Time: 8562-8548 PT Time Calculation (min) (ACUTE ONLY): 14 min   Charges:   PT Evaluation $PT Eval Low Complexity: 1 Low   PT General Charges $$ ACUTE PT VISIT: 1 Visit         Sylvan Delon Copp PT 06/20/2024  Acute Rehabilitation Services  Office [phone]

## 2024-06-21 ENCOUNTER — Telehealth: Payer: Self-pay

## 2024-06-21 LAB — URINE CULTURE: Culture: 50000 — AB

## 2024-06-21 NOTE — Transitions of Care (Post Inpatient/ED Visit) (Unsigned)
   06/21/2024  Name: Shadow Stiggers MRN: [id number] DOB: [birth year]  Today's TOC FU Call Status: Unsuccessful Call (1st Attempt) Date: 06/21/24  Attempted to reach the patient regarding the most recent Inpatient/ED visit.  Follow Up Plan: Additional outreach attempts will be made to reach the patient to complete the Transitions of Care (Post Inpatient/ED visit) call.   Signature Julian Lemmings, LPN [facility] Nurse Health Advisor Direct Dial [phone]

## 2024-06-21 NOTE — Progress Notes (Unsigned)
   06/22/2024 Name: [name] MRN: [id number] DOB: 18-Mar-1951  Subjective  Chief Complaint  Patient presents with   Medication Access    Care Team: Primary Care Provider: [name], NP  Reason for visit: ?  [name] is a 73 y.o. female who presents today for a telephone visit with the pharmacist due to medication access concerns regarding their Xifaxan . ?   Medication Access: ?  Reports that all medications are not affordable.  Reports they received a supply from gastro (samples) recently though there is generally a patient waitlist Purchased prescription once so far ($1000/month) this year All other prescriptions they feel are affordable at around $6-10. Levocarnitine  ~$47/month which they feel is feasible.   Prescription drug coverage: YES Payor: Multimedia programmer / Plan: UHC MEDICARE / Product Type: *No Product type* / .  Summary of Benefit: Catastrophic coverage phase   Current Patient Assistance: N/A  Patient lives in a household of 2 with an estimated combined annual income of 38,400 via Social Security Retirement.  Medicare LIS Eligible: No . 3200/month exceeds limit.   Assessment and Plan:   1. Medication Access Test claim shows 22-month supply of Xifaxan  = $0 Catastrophic coverage phase meaning all medications for the remainder of the year should remain $0.  Patient plans to go to pharmacy this afternoon to give them current Rx (hard copy). If not $0, patient will call back to discuss.  2026: Should qualify for [employer] PAP per website eligibility criteria. Patient verbalizes understanding.   Follow up: Patient provided with direct line for questions/follow up  Future Appointments  Date Time Provider Department Center  06/27/2024  3:30 PM Ulyses Daved NOVAK, [place] [facility]  07/05/2024  1:45 PM [facility] ENDO/NEURO LAB [facility] None  07/12/2024  2:20 PM Dartha Ernst, MD [facility] None  07/31/2024  9:00 AM Onita Duos, MD [facility] None   07/31/2024  2:00 PM Cara Elida HERO, NP [facility]  08/02/2024  2:40 PM [name], NP [facility] PEC  10/02/2024 10:30 AM Wyn Jackee VEAR Mickey., NP [facility] H&V  03/28/2025  1:40 PM [facility] ANNUAL WELLNESS VISIT 1 [facility]   Manuelita FABIENE Kobs, PharmD Clinical Pharmacist [facility] [phone]

## 2024-06-22 ENCOUNTER — Other Ambulatory Visit (INDEPENDENT_AMBULATORY_CARE_PROVIDER_SITE_OTHER): Admitting: Pharmacist

## 2024-06-22 DIAGNOSIS — K7460 Unspecified cirrhosis of liver: Secondary | ICD-10-CM

## 2024-06-22 NOTE — Patient Instructions (Signed)
 Ms. Anouk Critzer,   It was a pleasure to speak with you today! As we discussed:?   You may have entered the Catastrophic Coverage Phase of Medicare based on the test claim I am seeing in the system through [facility] If this is the case, this would mean that ALL medications for the remainder of the calendar year would be $0. In January, your Medicare plan would reset, and the cost of medicines would return. I recommend applying for the Patient Assistance Program through the drug manufacturer [facility] [employer] makes Xifaxan )  This application can be found online and would need to be completed in conjunction with your gastroenterologist (Though I am happy to touch base at that time to help make that process easier).  [email or web address]    Please reach out prior to your next scheduled appointment should you have any questions or concerns.  You may respond directly to this message, or leave me a voicemail at [phone] and I will get back to you shortly.   Thank you!   Manuelita FABIENE Kobs, PharmD Clinical Pharmacist [facility] [phone]

## 2024-06-27 ENCOUNTER — Inpatient Hospital Stay: Admitting: Nurse Practitioner

## 2024-06-27 ENCOUNTER — Ambulatory Visit: Admitting: Physical Therapy

## 2024-06-27 NOTE — Progress Notes (Deleted)
   Acute Office Visit  Subjective:     Patient ID: [name], female    DOB: [birth year], 73 y.o.   MRN: [id number]  No chief complaint on file.   HPI Patient is in today for hospital follow-up  Patient went to the emergency department on 06/19/2024 with complaints of altered mental status she is confused over the past 2 to 3 days was having some waxing and waning symptoms inclusive of lethargy and word finding difficulty she did have reduced bowel movements per husband's report.  Patient's ammonia was elevated at 123 she was given lactulose  enema potassium was replaced IV and given lactated Ringer 's.  Patient also received CT scan of the head.  CT scan of the head showed no evidence of acute intercranial abnormality.  Did show prior left PCA territory infarct.  Patient was discharged on 06/20/2024.  To follow-up with me in a week and GI as scheduled.  Ammonia level trended down did work with PT in the hospital and advised home health services.  The patient can restart spironolactone  for blood pressure tolerated that will be with mine and her discretion discretion.  ROS      Objective:    There were no vitals taken for this visit. {Vitals History (Optional):23777}  Physical Exam  No results found for any visits on 06/27/24.      Assessment & Plan:   Problem List Items Addressed This Visit   None   No orders of the defined types were placed in this encounter.   No follow-ups on file.  [name], NP

## 2024-06-30 ENCOUNTER — Other Ambulatory Visit: Payer: Self-pay | Admitting: Nurse Practitioner

## 2024-06-30 ENCOUNTER — Ambulatory Visit (INDEPENDENT_AMBULATORY_CARE_PROVIDER_SITE_OTHER): Admitting: Nurse Practitioner

## 2024-06-30 ENCOUNTER — Telehealth: Payer: Self-pay | Admitting: Nurse Practitioner

## 2024-06-30 VITALS — BP 116/72 | HR 70 | Temp 97.3°F | Ht 63.0 in | Wt 152.2 lb

## 2024-06-30 DIAGNOSIS — K7469 Other cirrhosis of liver: Secondary | ICD-10-CM | POA: Diagnosis not present

## 2024-06-30 DIAGNOSIS — R4182 Altered mental status, unspecified: Secondary | ICD-10-CM

## 2024-06-30 DIAGNOSIS — E039 Hypothyroidism, unspecified: Secondary | ICD-10-CM | POA: Diagnosis not present

## 2024-06-30 DIAGNOSIS — N289 Disorder of kidney and ureter, unspecified: Secondary | ICD-10-CM

## 2024-06-30 DIAGNOSIS — R7989 Other specified abnormal findings of blood chemistry: Secondary | ICD-10-CM

## 2024-06-30 DIAGNOSIS — Z09 Encounter for follow-up examination after completed treatment for conditions other than malignant neoplasm: Secondary | ICD-10-CM | POA: Diagnosis not present

## 2024-06-30 LAB — CBC
HCT: 32.8 % — ABNORMAL LOW (ref 36.0–46.0)
Hemoglobin: 11 g/dL — ABNORMAL LOW (ref 12.0–15.0)
MCHC: 33.4 g/dL (ref 30.0–36.0)
MCV: 99.2 fl (ref 78.0–100.0)
Platelets: 151 K/uL (ref 150.0–400.0)
RBC: 3.31 Mil/uL — ABNORMAL LOW (ref 3.87–5.11)
RDW: 23.1 % — ABNORMAL HIGH (ref 11.5–15.5)
WBC: 3.4 K/uL — ABNORMAL LOW (ref 4.0–10.5)

## 2024-06-30 LAB — COMPREHENSIVE METABOLIC PANEL WITH GFR
ALT: 50 U/L — ABNORMAL HIGH (ref 0–35)
AST: 77 U/L — ABNORMAL HIGH (ref 0–37)
Albumin: 3.3 g/dL — ABNORMAL LOW (ref 3.5–5.2)
Alkaline Phosphatase: 231 U/L — ABNORMAL HIGH (ref 39–117)
BUN: 12 mg/dL (ref 6–23)
CO2: 32 meq/L (ref 19–32)
Calcium: 9.6 mg/dL (ref 8.4–10.5)
Chloride: 99 meq/L (ref 96–112)
Creatinine, Ser: 1.24 mg/dL — ABNORMAL HIGH (ref 0.40–1.20)
GFR: 43.3 mL/min — ABNORMAL LOW (ref >60.00)
Glucose, Bld: 88 mg/dL (ref 70–99)
Potassium: 4.6 meq/L (ref 3.5–5.1)
Sodium: 138 meq/L (ref 135–145)
Total Bilirubin: 1.4 mg/dL — ABNORMAL HIGH (ref 0.2–1.2)
Total Protein: 6.3 g/dL (ref 6.0–8.3)

## 2024-06-30 LAB — TSH: TSH: 6.61 u[IU]/mL — ABNORMAL HIGH (ref 0.35–5.50)

## 2024-06-30 LAB — T3, FREE: T3, Free: 2.7 pg/mL (ref 2.3–4.2)

## 2024-06-30 LAB — T4, FREE: Free T4: 1.11 ng/dL (ref 0.60–1.60)

## 2024-06-30 LAB — AMMONIA: Ammonia: 83 umol/L — ABNORMAL HIGH (ref 11–35)

## 2024-06-30 MED ORDER — HYDROCORTISONE (PERIANAL) 2.5 % EX CREA: 1.0000 | TOPICAL_CREAM | Freq: Two times a day (BID) | CUTANEOUS | 0 refills | Status: AC

## 2024-06-30 NOTE — Telephone Encounter (Signed)
 Called and spoke with patient and husband about elevated ammonia and decreased kidney function. She has been working on Oral fluid intake and has had a BM.  If she gets any worse with mentation they will take her to the hospital.   Discussed the possibility of the kidney functioning worsening He will make sure she is getting 64oz of water a day. We will repeat labs on Monday.  ED precautions reviewed

## 2024-06-30 NOTE — Assessment & Plan Note (Signed)
 Did review ED note, inpatient discharge note, most recent labs, and imaging.

## 2024-06-30 NOTE — Assessment & Plan Note (Signed)
 For the same.  Patient currently maintained on spironolactone  50 mg daily, Lasix  20-40 mg daily, lactulose  orally and enemas at home.  She is followed by liver clinic and GI pending ammonia level today

## 2024-06-30 NOTE — Assessment & Plan Note (Signed)
 History of the same.  Patient with somewhat listless in office concerning for encephalopathy pending labs inclusive of ammonia ED precautions reviewed

## 2024-06-30 NOTE — Progress Notes (Signed)
 Acute Office Visit  Subjective:     Patient ID: [name], female    DOB: [birth year], 73 y.o.   MRN: [id number]  Chief Complaint  Patient presents with   Hospitalization Follow-up    Pt complains of feeling okay since leaving hospital. Pt spouse states last night she was starting to feel bad again. Temp was low 95.3 at 1:45am     HPI Patient is in today for hospital follow-up  Patient went to the emergency department on 06/19/2024 with complaints of altered mental status she is confused over the past 2 to 3 days was having some waxing and waning symptoms inclusive of lethargy and word finding difficulty. she did have reduced bowel movements per husband's report.  Patient's ammonia was elevated at 123 she was given lactulose  enema, potassium was replaced IV and given lactated Ringer 's.  Patient also received CT scan of the head.  CT scan of the head showed no evidence of acute intercranial abnormality.  Did show prior left PCA territory infarct.  Patient was discharged on 06/20/2024.  To follow-up with me in a week and GI as scheduled.  Ammonia level trended down did work with PT in the hospital and advised home health services.  The patient can restart spironolactone  for blood pressure as tolerated that will be with mine and her discretion.  She is on spironlactone 50mg  and lasix  20-40 depending on blood pressure.   States that he has given her tow lactulose  enemas at home on Tuesday. States that Wednesday was better and her appetite was good. States that last night they were watching tv and the spouse fell asleep. States that her temp was low. Sttes that she went to 96 to 97 and then 97.7 this am with blankets and heating blankets. She was not drinking water. This morning she has been drinking water approx 50 ounces. Has had miralax  gummies and her medications inclusive of 45 g or lactulose . She has been having oozing BM  Synthrroid (name brand) 75mcg  Review of Systems   Constitutional:  Negative for chills and fever.  Respiratory:  Negative for shortness of breath.   Cardiovascular:  Negative for chest pain.  Gastrointestinal:  Negative for constipation and diarrhea.  Neurological:  Negative for headaches.        Objective:    BP 116/72   Pulse 70   Temp (!) 97.3 F (36.3 C) (Oral)   Ht 5' 3 (1.6 m)   Wt 152 lb 3.2 oz (69 kg)   SpO2 97%   BMI 26.96 kg/m    Physical Exam Vitals and nursing note reviewed.  Constitutional:      Appearance: Normal appearance.  HENT:     Mouth/Throat:     Mouth: Mucous membranes are moist.     Pharynx: Oropharynx is clear. No posterior oropharyngeal erythema.  Eyes:     Extraocular Movements: Extraocular movements intact.     Pupils: Pupils are equal, round, and reactive to light.  Cardiovascular:     Rate and Rhythm: Normal rate and regular rhythm.     Heart sounds: Normal heart sounds.  Pulmonary:     Effort: Pulmonary effort is normal.     Breath sounds: Normal breath sounds.  Musculoskeletal:     Right lower leg: Edema present.     Left lower leg: Edema present.  Neurological:     General: No focal deficit present.     Mental Status: She is alert.     Motor: Motor  function is intact.     Coordination: Coordination is intact.     Gait: Gait is intact.     Deep Tendon Reflexes:     Reflex Scores:      Bicep reflexes are 2+ on the right side and 2+ on the left side.      Patellar reflexes are 1+ on the right side and 1+ on the left side.    Comments: Bilateral upper and lower extremity strength 5/5  Listless   Mild asterixis     No results found for any visits on 06/30/24.      Assessment & Plan:   Problem List Items Addressed This Visit       Digestive   Cirrhosis (HCC)   For the same.  Patient currently maintained on spironolactone  50 mg daily, Lasix  20-40 mg daily, lactulose  orally and enemas at home.  She is followed by liver clinic and GI pending ammonia level today       Relevant Orders   CBC   Comprehensive metabolic panel with GFR   Ammonia     Endocrine   Acquired hypothyroidism   History of the same currently maintained on Synthroid  (name brand) 75 mcg daily.  Pending TSH      Relevant Orders   T3, free   T4, free   TSH     Other   Hospital discharge follow-up - Primary   Did review ED note, inpatient discharge note, most recent labs, and imaging.      Altered mental status   History of the same.  Patient with somewhat listless in office concerning for encephalopathy pending labs inclusive of ammonia ED precautions reviewed      Relevant Orders   CBC   Comprehensive metabolic panel with GFR   Ammonia   T3, free   T4, free   TSH    Meds ordered this encounter  Medications   hydrocortisone  (ANUSOL -HC) 2.5 % rectal cream    Sig: Place 1 Application rectally 2 (two) times daily.    Dispense:  30 g    Refill:  0    Supervising Provider:   RANDEEN HARDY A [[id number]]    Return in about 5 days (around 07/05/2024) for AMS.  [name], NP

## 2024-06-30 NOTE — Assessment & Plan Note (Signed)
 History of the same currently maintained on Synthroid  (name brand) 75 mcg daily.  Pending TSH

## 2024-07-01 ENCOUNTER — Encounter (HOSPITAL_COMMUNITY): Payer: Self-pay

## 2024-07-01 ENCOUNTER — Other Ambulatory Visit: Payer: Self-pay

## 2024-07-01 ENCOUNTER — Inpatient Hospital Stay (HOSPITAL_COMMUNITY)

## 2024-07-01 ENCOUNTER — Inpatient Hospital Stay (HOSPITAL_COMMUNITY)
Admission: EM | Admit: 2024-07-01 | Discharge: 2024-07-03 | DRG: 442 | Disposition: A | Discharge status: Home / Self Care | Attending: Internal Medicine | Admitting: Internal Medicine

## 2024-07-01 DIAGNOSIS — R680 Hypothermia, not associated with low environmental temperature: Secondary | ICD-10-CM | POA: Diagnosis present

## 2024-07-01 DIAGNOSIS — E039 Hypothyroidism, unspecified: Secondary | ICD-10-CM | POA: Diagnosis not present

## 2024-07-01 DIAGNOSIS — Z79899 Other long term (current) drug therapy: Secondary | ICD-10-CM

## 2024-07-01 DIAGNOSIS — Z8261 Family history of arthritis: Secondary | ICD-10-CM

## 2024-07-01 DIAGNOSIS — K7469 Other cirrhosis of liver: Secondary | ICD-10-CM | POA: Diagnosis not present

## 2024-07-01 DIAGNOSIS — D61818 Other pancytopenia: Secondary | ICD-10-CM

## 2024-07-01 DIAGNOSIS — Z833 Family history of diabetes mellitus: Secondary | ICD-10-CM | POA: Diagnosis not present

## 2024-07-01 DIAGNOSIS — Z7982 Long term (current) use of aspirin: Secondary | ICD-10-CM

## 2024-07-01 DIAGNOSIS — K7682 Hepatic encephalopathy: Principal | ICD-10-CM

## 2024-07-01 DIAGNOSIS — I700 Atherosclerosis of aorta: Secondary | ICD-10-CM | POA: Diagnosis not present

## 2024-07-01 DIAGNOSIS — Z85038 Personal history of other malignant neoplasm of large intestine: Secondary | ICD-10-CM

## 2024-07-01 DIAGNOSIS — K253 Acute gastric ulcer without hemorrhage or perforation: Secondary | ICD-10-CM

## 2024-07-01 DIAGNOSIS — Z822 Family history of deafness and hearing loss: Secondary | ICD-10-CM | POA: Diagnosis not present

## 2024-07-01 DIAGNOSIS — R6510 Systemic inflammatory response syndrome (SIRS) of non-infectious origin without acute organ dysfunction: Secondary | ICD-10-CM | POA: Diagnosis present

## 2024-07-01 DIAGNOSIS — Z8249 Family history of ischemic heart disease and other diseases of the circulatory system: Secondary | ICD-10-CM

## 2024-07-01 DIAGNOSIS — Z79890 Hormone replacement therapy (postmenopausal): Secondary | ICD-10-CM | POA: Diagnosis not present

## 2024-07-01 DIAGNOSIS — Z85828 Personal history of other malignant neoplasm of skin: Secondary | ICD-10-CM | POA: Diagnosis not present

## 2024-07-01 DIAGNOSIS — R531 Weakness: Secondary | ICD-10-CM

## 2024-07-01 DIAGNOSIS — R4182 Altered mental status, unspecified: Secondary | ICD-10-CM | POA: Diagnosis present

## 2024-07-01 DIAGNOSIS — Z809 Family history of malignant neoplasm, unspecified: Secondary | ICD-10-CM

## 2024-07-01 DIAGNOSIS — I10 Essential (primary) hypertension: Secondary | ICD-10-CM | POA: Diagnosis present

## 2024-07-01 DIAGNOSIS — R7989 Other specified abnormal findings of blood chemistry: Secondary | ICD-10-CM

## 2024-07-01 DIAGNOSIS — K766 Portal hypertension: Secondary | ICD-10-CM | POA: Diagnosis not present

## 2024-07-01 DIAGNOSIS — K7460 Unspecified cirrhosis of liver: Secondary | ICD-10-CM | POA: Diagnosis not present

## 2024-07-01 DIAGNOSIS — I517 Cardiomegaly: Secondary | ICD-10-CM | POA: Diagnosis not present

## 2024-07-01 DIAGNOSIS — R739 Hyperglycemia, unspecified: Secondary | ICD-10-CM

## 2024-07-01 DIAGNOSIS — K7290 Hepatic failure, unspecified without coma: Secondary | ICD-10-CM | POA: Diagnosis not present

## 2024-07-01 LAB — CBC WITH DIFFERENTIAL/PLATELET
Abs Immature Granulocytes: 0.01 K/uL (ref 0.00–0.07)
Basophils Absolute: 0 K/uL (ref 0.0–0.1)
Basophils Relative: 1 %
Eosinophils Absolute: 0.1 K/uL (ref 0.0–0.5)
Eosinophils Relative: 3 %
HCT: 34.6 % — ABNORMAL LOW (ref 36.0–46.0)
Hemoglobin: 10.9 g/dL — ABNORMAL LOW (ref 12.0–15.0)
Immature Granulocytes: 0 %
Lymphocytes Relative: 27 %
Lymphs Abs: 0.7 K/uL (ref 0.7–4.0)
MCH: 32.2 pg (ref 26.0–34.0)
MCHC: 31.5 g/dL (ref 30.0–36.0)
MCV: 102.1 fL — ABNORMAL HIGH (ref 80.0–100.0)
Monocytes Absolute: 0.3 K/uL (ref 0.1–1.0)
Monocytes Relative: 11 %
Neutro Abs: 1.4 K/uL — ABNORMAL LOW (ref 1.7–7.7)
Neutrophils Relative %: 58 %
Platelets: 139 K/uL — ABNORMAL LOW (ref 150–400)
RBC: 3.39 MIL/uL — ABNORMAL LOW (ref 3.87–5.11)
RDW: 20.8 % — ABNORMAL HIGH (ref 11.5–15.5)
WBC: 2.4 K/uL — ABNORMAL LOW (ref 4.0–10.5)
nRBC: 0 % (ref 0.0–0.2)

## 2024-07-01 LAB — COMPREHENSIVE METABOLIC PANEL WITH GFR
ALT: 54 U/L — ABNORMAL HIGH (ref 0–44)
AST: 89 U/L — ABNORMAL HIGH (ref 15–41)
Albumin: 3.1 g/dL — ABNORMAL LOW (ref 3.5–5.0)
Alkaline Phosphatase: 212 U/L — ABNORMAL HIGH (ref 38–126)
Anion gap: 13 (ref 5–15)
BUN: 12 mg/dL (ref 8–23)
CO2: 22 mmol/L (ref 22–32)
Calcium: 9.8 mg/dL (ref 8.9–10.3)
Chloride: 101 mmol/L (ref 98–111)
Creatinine, Ser: 0.89 mg/dL (ref 0.44–1.00)
GFR, Estimated: >60 mL/min (ref >60)
Glucose, Bld: 122 mg/dL — ABNORMAL HIGH (ref 70–99)
Potassium: 4.3 mmol/L (ref 3.5–5.1)
Sodium: 136 mmol/L (ref 135–145)
Total Bilirubin: 1.8 mg/dL — ABNORMAL HIGH (ref 0.0–1.2)
Total Protein: 6.8 g/dL (ref 6.5–8.1)

## 2024-07-01 LAB — URINALYSIS, W/ REFLEX TO CULTURE (INFECTION SUSPECTED)
Bacteria, UA: NONE SEEN
Bilirubin Urine: NEGATIVE
Glucose, UA: NEGATIVE mg/dL
Hgb urine dipstick: NEGATIVE
Ketones, ur: NEGATIVE mg/dL
Leukocytes,Ua: NEGATIVE
Nitrite: NEGATIVE
Protein, ur: NEGATIVE mg/dL
Specific Gravity, Urine: 1.01 (ref 1.005–1.030)
pH: 6 (ref 5.0–8.0)

## 2024-07-01 LAB — PROTIME-INR
INR: 1.5 — ABNORMAL HIGH (ref 0.8–1.2)
Prothrombin Time: 18.7 s — ABNORMAL HIGH (ref 11.4–15.2)

## 2024-07-01 LAB — AMMONIA: Ammonia: 48 umol/L — ABNORMAL HIGH (ref 9–35)

## 2024-07-01 MED ORDER — RIFAXIMIN 550 MG PO TABS
550.0000 mg | ORAL_TABLET | Freq: Two times a day (BID) | ORAL | Status: DC
  Administered 2024-07-01 – 2024-07-03 (×6): 550 mg via ORAL
  Filled 2024-07-01 (×5): qty 1

## 2024-07-01 MED ORDER — RISAQUAD PO CAPS
1.0000 | ORAL_CAPSULE | Freq: Every day | ORAL | Status: DC
  Administered 2024-07-01 – 2024-07-03 (×4): 1 via ORAL
  Filled 2024-07-01 (×3): qty 1

## 2024-07-01 MED ORDER — FUROSEMIDE 20 MG PO TABS
20.0000 mg | ORAL_TABLET | Freq: Every day | ORAL | Status: DC
Start: 2024-07-02 — End: 2024-07-03
  Administered 2024-07-02 – 2024-07-03 (×3): 20 mg via ORAL
  Filled 2024-07-01 (×2): qty 1

## 2024-07-01 MED ORDER — ONDANSETRON HCL 4 MG/2ML IJ SOLN: 4.0000 mg | Freq: Four times a day (QID) | INTRAMUSCULAR | Status: DC | PRN

## 2024-07-01 MED ORDER — SODIUM CHLORIDE 0.9 % IV SOLN
1.0000 g | INTRAVENOUS | Status: DC
  Administered 2024-07-01 – 2024-07-03 (×4): 1 g via INTRAVENOUS
  Filled 2024-07-01 (×3): qty 10

## 2024-07-01 MED ORDER — PROBIOTIC PEARLS WOMENS PO CAPS: 1.0000 | ORAL_CAPSULE | Freq: Every morning | ORAL | Status: DC

## 2024-07-01 MED ORDER — LACTULOSE 10 GM/15ML PO SOLN: 30.0000 g | Freq: Once | ORAL | Status: DC

## 2024-07-01 MED ORDER — LEVOTHYROXINE SODIUM 75 MCG PO TABS
75.0000 ug | ORAL_TABLET | Freq: Every day | ORAL | Status: DC
  Administered 2024-07-01 – 2024-07-03 (×4): 75 ug via ORAL
  Filled 2024-07-01 (×3): qty 1

## 2024-07-01 MED ORDER — POLYETHYLENE GLYCOL 3350 17 G PO PACK
17.0000 g | PACK | Freq: Every day | ORAL | Status: DC | PRN
  Administered 2024-07-01: 17 g via ORAL
  Filled 2024-07-01: qty 1

## 2024-07-01 MED ORDER — LACTULOSE 10 GM/15ML PO SOLN
30.0000 g | Freq: Four times a day (QID) | ORAL | Status: DC
  Administered 2024-07-01 – 2024-07-03 (×10): 30 g via ORAL
  Filled 2024-07-01 (×9): qty 45

## 2024-07-01 MED ORDER — FUROSEMIDE 40 MG PO TABS: 20.0000 mg | ORAL_TABLET | Freq: Every day | ORAL | Status: DC

## 2024-07-01 MED ORDER — METRONIDAZOLE 500 MG/100ML IV SOLN
500.0000 mg | Freq: Two times a day (BID) | INTRAVENOUS | Status: DC
  Administered 2024-07-01 – 2024-07-03 (×6): 500 mg via INTRAVENOUS
  Filled 2024-07-01 (×5): qty 100

## 2024-07-01 MED ORDER — SPIRONOLACTONE 25 MG PO TABS
50.0000 mg | ORAL_TABLET | Freq: Every day | ORAL | Status: DC
  Administered 2024-07-01 – 2024-07-03 (×4): 50 mg via ORAL
  Filled 2024-07-01 (×3): qty 2

## 2024-07-01 NOTE — Progress Notes (Signed)
 PROGRESS NOTE  [name] FMW:[id number] DOB: 05-Apr-1951 DOA: 07/01/2024 PCP: [name], NP   LOS: 0 days   Brief narrative:  [name] is a 73 y.o. female with past medical history significant for cirrhosis, prior hepatic encephalopathy, acquired hypothyroidism, presented to hospital with altered mental status and elevated ammonia level for 5 days with generalized weakness.  She had gone to her primary care provider day prior to presentation when ammonia levels were noted to be elevated and she took multiple doses of lactulose  with good results, that led to multiple bowel movements.  They were advised to go to the ER for further management.  In the ED patient was noted to be hypothermic with temperature of 92.7 F.  Ammonia level had improved to 248 from 83.  Urinalysis was negative for pyuria.  CBC showed leukopenia with WBC count at 2.4.  LFTs slightly elevated with AST 89 ALT 54 and albumin  was 3.1 with total bilirubin 1.8.  Patient was still not at her baseline so was considered for admission to the hospital for further evaluation and treatment.      Assessment/Plan: Principal Problem:   AMS (altered mental status) Active Problems:   Altered mental status   Altered mental status, acute hepatic encephalopathy,  Ammonia level improved to 43 from 87 with therapeutic doses of lactulose .  Continue lactulose  and rifaximin ..  Urinalysis without any pyuria.  Chest x-ray without any infiltrate.  Electrolytes within normal range.  Nonfocal weakness.  Patient is slightly sluggish but Communicative.   SIRS with concern for possible infection Hypothymia with core temperature of 92.7 Fahrenheit on presentation with leukopenia.  Follow blood cultures.  Patient saturating well on room air.  Has leukopenia but has liver disease.  Chest x-ray without any infiltrate.  Abdominal x-ray was unremarkable.  Empirically on Rocephin  and Flagyl  at this time.  Will de-escalate if cultures negative by  a.m.   Generalized weakness  will get PT OT evaluation.   Acquired hypothyroidism TSH 6.61, free T4 1.11, On 06/30/2024 continue Synthroid    Hypothermia Improving.  Latest temperature of 97.3 F.   Elevated LFTs, coagulopathy secondary to hepatic cirrhosis Continue supportive care.Continue to monitor LFTs.  DVT prophylaxis: SCDs Start: 07/01/24 0439   Disposition: Home likely in 1 to 2 days if mental Improved.  Status is: Inpatient Remains inpatient appropriate because: Altered mental status, generalized weakness    Code Status:     Code Status: Full Code  Family Communication: Spoke with the husband at bedside  Consultants: None  Procedures: None  Anti-infectives:  Rifaximin  Rocephin  and Flagyl   Anti-infectives (From admission, onward)    Start     Dose/Rate Route Frequency Ordered Stop   07/01/24 1000  rifaximin  (XIFAXAN ) tablet 550 mg        550 mg Oral 2 times daily 07/01/24 0605     07/01/24 0600  cefTRIAXone  (ROCEPHIN ) 1 g in sodium chloride  0.9 % 100 mL IVPB        1 g 200 mL/hr over 30 Minutes Intravenous Every 24 hours 07/01/24 0553     07/01/24 0600  metroNIDAZOLE  (FLAGYL ) IVPB 500 mg        500 mg 100 mL/hr over 60 Minutes Intravenous Every 12 hours 07/01/24 0553          Subjective: Today, patient was seen and examined at bedside.  Appears to be slow and sluggish but Communicative.  Oriented to place.  Patient husband at bedside states that she is not back  to her baseline.  Has had a large bowel movement yesterday.  Objective: Vitals:   07/01/24 1000 07/01/24 1024  BP: (!) 129/53 (!) 134/59  Pulse: 72 97  Resp: 18 20  Temp:  98.2 F (36.8 C)  SpO2: 96% 95%    Intake/Output Summary (Last 24 hours) at 07/01/2024 1111 Last data filed at 07/01/2024 0910 Gross per 24 hour  Intake 200 ml  Output --  Net 200 ml   Filed Weights   07/01/24 1030  Weight: 69 kg   Body mass index is 26.95 kg/m.   Physical Exam: GENERAL: Patient is alert  awake and Communicative, oriented to self, not in obvious distress. HENT: No scleral pallor or icterus. Pupils equally reactive to light. Oral mucosa is moist NECK: is supple, no gross swelling noted. CHEST: Clear to auscultation. No crackles or wheezes.  Diminished breath sounds bilaterally. CVS: S1 and S2 heard, no murmur. Regular rate and rhythm.  ABDOMEN: Soft, bowel sounds are present.  Nontender on palpation EXTREMITIES: No edema. CNS: Cranial nerves are intact. No focal motor deficits.  Generalized weakness noted SKIN: warm and dry without rashes.  Data Review: I have personally reviewed the following laboratory data and studies,  CBC: Recent Labs  Lab 06/30/24 1145 07/01/24 0330  WBC 3.4* 2.4*  NEUTROABS  --  1.4*  HGB 11.0* 10.9*  HCT 32.8* 34.6*  MCV 99.2 102.1*  PLT 151.0 139*   Basic Metabolic Panel: Recent Labs  Lab 06/30/24 1145 07/01/24 0330  NA 138 136  K 4.6 4.3  CL 99 101  CO2 32 22  GLUCOSE 88 122*  BUN 12 12  CREATININE 1.24* 0.89  CALCIUM  9.6 9.8   Liver Function Tests: Recent Labs  Lab 06/30/24 1145 07/01/24 0330  AST 77* 89*  ALT 50* 54*  ALKPHOS 231* 212*  BILITOT 1.4* 1.8*  PROT 6.3 6.8  ALBUMIN  3.3* 3.1*   No results for input(s): LIPASE, AMYLASE in the last 168 hours. Recent Labs  Lab 06/30/24 1145 07/01/24 0330  AMMONIA 83* 48*   Cardiac Enzymes: No results for input(s): CKTOTAL, CKMB, CKMBINDEX, TROPONINI in the last 168 hours. BNP (last 3 results) No results for input(s): BNP in the last 8760 hours.  ProBNP (last 3 results) Recent Labs    08/10/23 0946  PROBNP 45.0    CBG: No results for input(s): GLUCAP in the last 168 hours. No results found for this or any previous visit (from the past 240 hours).   Studies: DG CHEST PORT 1 VIEW Result Date: 07/01/2024 CLINICAL DATA:  [id number]. Systemic inflammatory response syndrome with mental status changes and elevated ammonia levels. EXAM: PORTABLE CHEST 1  VIEW COMPARISON:  Portable chest 05/20/2024 FINDINGS: The lungs are clear of infiltrates. There is linear scarring or atelectasis in both lower lung fields. There is no substantial pleural effusion. There is mild cardiomegaly but no evidence of CHF. The mediastinum is normally outlined. There is calcification in the transverse aorta. There is thoracic spondylosis with no new osseous findings. Again noted is a partially visible TIPS stent. Compare: Overall aeration seems unchanged. IMPRESSION: 1. No evidence of acute chest disease. 2. Mild cardiomegaly. 3. Aortic atherosclerosis. 4. TIPS stent. Electronically Signed   By: Francis Quam M.D.   On: 07/01/2024 06:53   DG Abd Portable 1V Result Date: 07/01/2024 EXAM: 1 VIEW XRAY OF THE ABDOMEN 07/01/2024 06:09:00 AM COMPARISON: 1 view abdomen 04/03/24. CLINICAL HISTORY: History of hypertension, colon cancer, cirrhosis of the liver with esophageal varices, and  hepatic encephalopathy. Patient brought in due to elevated ammonia level and mental status change. Family member reports patient was slightly off yesterday, worse this morning, and became much more confused during the day. FINDINGS: BOWEL: Nonobstructive bowel gas pattern. SOFT TISSUES: No abnormal calcifications or opaque urinary calculi. BONES: Mild degenerative changes are present in the left hip. No acute osseous abnormality. VASCULATURE: Portosystemic shunt is in place. IMPRESSION: 1. No acute findings. Electronically signed by: Lonni Necessary MD 07/01/2024 06:51 AM EDT RP Workstation: [id number]      Vernal Alstrom, MD  [facility] 07/01/2024  If 7PM-7AM, please contact night-coverage

## 2024-07-01 NOTE — ED Triage Notes (Signed)
 Pt. Arrives pov for ams x1 night. Pt. Was seen at her PCP to have her labs drawn. Provider called back stating that her ammonia was high. Per pt.'s husband, as the night went on, she became more altered and was unable to have conversation. Pt. Unable to answer questions at this time.

## 2024-07-01 NOTE — ED Notes (Signed)
 Attempted to get temperature twice but failed.

## 2024-07-01 NOTE — ED Notes (Signed)
 3 warm blankets applied at this time

## 2024-07-01 NOTE — ED Provider Notes (Signed)
 [facility] AT [facility] Provider Note   CSN: [id number] Arrival date & time: 07/01/24  0236     Patient presents with: Altered Mental Status   Kalyse Meharg is a 73 y.o. female.   The history is provided by a relative. The history is limited by the condition of the patient (Altered mental status).  Altered Mental Status  She has history of hypertension, colon cancer, cirrhosis of the liver with esophageal varices, hepatic encephalopathy and is brought in because of elevated ammonia level and mental status change.  Family member states that she was slightly off yesterday, worse this morning.  She saw her primary care provider who drew labs including an ammonia level which came back elevated.  During the course of the day, patient has become much more confused.  She has not run a fever.  There has been no vomiting or diarrhea.  She did have a bowel movement.  She has been compliant with her lactulose .    Prior to Admission medications   Medication Sig Start Date End Date Taking? Authorizing Provider  Accu-Chek Softclix Lancets lancets 3 (three) times daily. 04/05/24   [provider]  aspirin  EC 81 MG tablet Take 1 tablet (81 mg total) by mouth daily. Swallow whole. 12/26/23   Bryn Bernardino NOVAK, MD  Blood Glucose Monitoring Suppl DEVI 1 each by Does not apply route in the morning, at noon, and at bedtime. May substitute to any manufacturer covered by patient's insurance. 04/05/24   Rojelio Nest, DO  CRANBERRY PO Take 1 tablet by mouth 2 (two) times daily.    [provider]  feeding supplement (ENSURE PLUS HIGH PROTEIN) LIQD Take 237 mLs by mouth 2 (two) times daily between meals. 06/20/24 07/20/24  Darci Pore, MD  ferrous sulfate  325 (65 FE) MG tablet Take 1 tablet (325 mg total) by mouth 2 (two) times daily with a meal. 05/28/24 08/26/24  Shahmehdi, Adriana LABOR, MD  furosemide  (LASIX ) 20 MG tablet Take 20-40 mg by mouth See admin instructions.  Take 40 mg by mouth every morning. Decrease the dose to 20 mg if the blood pressure is low.    [provider]  hydrocortisone  (ANUSOL -HC) 2.5 % rectal cream Place 1 Application rectally 2 (two) times daily. 06/30/24   [name], NP  lactulose  (CHRONULAC ) 10 GM/15ML SOLN enema Place 300 mLs rectally 2 (two) times daily as needed for severe constipation. 06/10/24   Rizwan, Saima, MD  lactulose  (CHRONULAC ) 10 GM/15ML solution Take 45 mLs (30 g total) by mouth 4 (four) times daily. 06/10/24   Rizwan, Saima, MD  levOCARNitine  (CARNITOR ) 330 MG tablet Take 330 mg by mouth See admin instructions. Take 330 mg by mouth with breakfast, between 3-5 PM, and at bedtime 05/23/22   [provider]  levothyroxine  (SYNTHROID ) 50 MCG tablet Take 1.5 tablets (75 mcg total) by mouth daily before breakfast. 06/10/24   Earley Saucer, MD  liver oil-zinc  oxide (DESITIN) 40 % ointment Apply topically daily as needed for irritation. 03/13/23   Pokhrel, Laxman, MD  magnesium  oxide (MAG-OX) 400 MG tablet Take 1 tablet (400 mg total) by mouth daily. Patient taking differently: Take 400 mg by mouth in the morning. 04/05/24   Rojelio Nest, DO  midodrine  (PROAMATINE ) 10 MG tablet Take 1 tablet (10 mg total) by mouth 3 (three) times daily with meals. 06/20/24 08/19/24  Darci Pore, MD  Multiple Vitamin (MULTI-VITAMIN) tablet Take 1 tablet by mouth in the morning.  [provider]  pantoprazole  (PROTONIX ) 40 MG tablet Take 1 tablet (40 mg total) by mouth 2 (two) times daily. Patient taking differently: Take 40 mg by mouth 2 (two) times daily before a meal. 05/28/24 06/30/24  Shahmehdi, Adriana LABOR, MD  Phenylephrine  HCl (PREPARATION H RE) Place 1 Dose rectally as needed (hemorrhoids).    [provider]  polyethylene glycol (MIRALAX  / GLYCOLAX ) 17 g packet Take 17 g by mouth daily. 06/10/24   Rizwan, Saima, MD  potassium chloride  SA (KLOR-CON  M) 20 MEQ tablet Take 1 tablet (20 mEq total) by mouth  daily. 06/20/24   Darci Pore, MD  Probiotic Product (PROBIOTIC PEARLS WOMENS) CAPS Take 1 capsule by mouth in the morning.    [provider]  PROTEIN PO Take 1 Bottle by mouth See admin instructions. Fair Life Nutrition Protein Shake- Drink 1 bottle by mouth daily if she can stomach it.    [provider]  rifaximin  (XIFAXAN ) 550 MG TABS tablet Take 1 tablet (550 mg total) by mouth 2 (two) times daily. Patient taking differently: Take 550 mg by mouth in the morning and at bedtime. 12/25/23   Bryn Bernardino NOVAK, MD  spironolactone  (ALDACTONE ) 50 MG tablet Take 1 tablet (50 mg total) by mouth daily. 06/21/24   Darci Pore, MD    Allergies: Contrast media [iodinated contrast media]    Review of Systems  Unable to perform ROS: Mental status change    Updated Vital Signs BP (!) 170/92 (BP Location: Left Arm)   Pulse 78   Resp 14   SpO2 94%   Physical Exam Vitals and nursing note reviewed.   73 year old female, resting comfortably and in no acute distress. Vital signs are significant for elevated blood pressure. Oxygen saturation is 94%, which is normal. Head is normocephalic and atraumatic. PERRLA, EOMI.  There is no scleral icterus. Neck is nontender and supple without adenopathy or JVD. Lungs are clear without rales, wheezes, or rhonchi. Chest is nontender. Heart has regular rate and rhythm without murmur. Abdomen is soft, flat, nontender. Extremities have 2+ edema. Skin is warm and dry without rash. Neurologic: Sleepy but easily arousable.  Oriented to person but not place or time.  Cranial nerves are grossly intact.  Moves all extremities equally.  Mild asterixis present.  (all labs ordered are listed, but only abnormal results are displayed) Labs Reviewed  COMPREHENSIVE METABOLIC PANEL WITH GFR - Abnormal; Notable for the following components:      Result Value   Glucose, Bld 122 (*)    Albumin  3.1 (*)    AST 89 (*)    ALT 54 (*)    Alkaline  Phosphatase 212 (*)    Total Bilirubin 1.8 (*)    All other components within normal limits  CBC WITH DIFFERENTIAL/PLATELET - Abnormal; Notable for the following components:   WBC 2.4 (*)    RBC 3.39 (*)    Hemoglobin 10.9 (*)    HCT 34.6 (*)    MCV 102.1 (*)    RDW 20.8 (*)    Platelets 139 (*)    Neutro Abs 1.4 (*)    All other components within normal limits  PROTIME-INR - Abnormal; Notable for the following components:   Prothrombin Time 18.7 (*)    INR 1.5 (*)    All other components within normal limits  AMMONIA - Abnormal; Notable for the following components:   Ammonia 48 (*)    All other components within normal limits  URINALYSIS, W/  REFLEX TO CULTURE (INFECTION SUSPECTED)    Procedures   Medications Ordered in the ED - No data to display                                  Medical Decision Making Amount and/or Complexity of Data Reviewed Labs: ordered.  Risk Prescription drug management. Decision regarding hospitalization.   Progressive somnolence and confusion.  This is a presentation with a wide range of treatment options and carries with it a high risk of morbidity and complications.  Differential diagnosis includes, but is not limited to, metabolic encephalopathy, medication side effect, occult infection.  However, in the setting of rising ammonia level, hepatic encephalopathy is the most likely diagnosis.  Per her family member, it is not unusual for her condition to worsen rapidly as it did today.  I have reviewed her past records, and labs done yesterday morning showed creatinine increased over baseline, stable elevation of transaminases, alkaline phosphatase increased over baseline, ammonia level 83 compared with 54 on 7/29, stable minimal elevation of total bilirubin, hemoglobin stable, stable leukopenia, platelet count normal (had moderate hypokalemia on recent blood draws).  I have ordered repeat labs and have added INR.  I have ordered a dose of lactulose .   I have reviewed her past records and note hospitalization 06/19/2024-06/20/2024 for hepatic encephalopathy.  I have reviewed her laboratory tests, and my interpretation is stable elevation of transaminases and alkaline phosphatase and bilirubin, mild elevation of random glucose which will need to be followed as an outpatient, stable pancytopenia, mildly elevated INR consistent with liver disease, ammonia level actually decreased compared with yesterday morning.  Ammonia is down to 48.  However, patient continues to be confused.  It is probable that there will be a lag between reduced blood ammonia level and clinical improvement.  However, she is not safe for discharge at this point.  I have discussed case with Dr. Shona of [facility], who agrees to admit the patient.  CRITICAL CARE Performed by: Alm Lias Total critical care time: 50 minutes Critical care time was exclusive of separately billable procedures and treating other patients. Critical care was necessary to treat or prevent imminent or life-threatening deterioration. Critical care was time spent personally by me on the following activities: development of treatment plan with patient and/or surrogate as well as nursing, discussions with consultants, evaluation of patient's response to treatment, examination of patient, obtaining history from patient or surrogate, ordering and performing treatments and interventions, ordering and review of laboratory studies, ordering and review of radiographic studies, pulse oximetry and re-evaluation of patient's condition.     Final diagnoses:  Hepatic encephalopathy (HCC)  Pancytopenia (HCC)  Elevated liver function tests  Elevated random blood glucose level    ED Discharge Orders     None          Lias Alm, MD 07/01/24 [phone]

## 2024-07-01 NOTE — Evaluation (Signed)
 Physical Therapy Evaluation Patient Details Name: [name] MRN: [id number] DOB: 23-Aug-1951 Today's Date: 07/01/2024  History of Present Illness  73 year old female presented to hospital with altered mental status and elevated ammonia level for 5 days with generalized weakness.  Past medical history significant for cirrhosis due to NASH, portal hypertension, esophageal varices, s/p TIPS, multiple admissions for hepatic encephalopathy, acute CVA 03/2024.  Clinical Impression  Pt admitted with above diagnosis.  Pt currently with functional limitations due to the deficits listed below (see PT Problem List). Pt will benefit from acute skilled PT to increase their independence and safety with mobility to allow discharge.  Pt familiar to this therapist from a previous admission.  Spouse reports pt had not been able to start OPPT due to health and recent admission.  Spouse plans for pt to return home and initiate OPPT [address] when pt is able to participate.  Pt currently mod assist for transfer to recliner today however cognition not at baseline.  Spouse also reports she was able to ambulate out to car with her cane to get to hospital however she couldn't get out of car very well and required his assist to get from car to w/c to enter ED.  Pt typically progresses with ambulating with RW during hospitalizations.           If plan is discharge home, recommend the following: A little help with bathing/dressing/bathroom;Assistance with cooking/housework;Assist for transportation;Help with stairs or ramp for entrance;A little help with walking and/or transfers   Can travel by private vehicle        Equipment Recommendations None recommended by PT  Recommendations for Other Services       Functional Status Assessment Patient has had a recent decline in their functional status and demonstrates the ability to make significant improvements in function in a reasonable and predictable amount of time.      Precautions / Restrictions Precautions Precautions: Fall Recall of Precautions/Restrictions: Impaired Precaution/Restrictions Comments: cognition not at baseline per spouse      Mobility  Bed Mobility Overal bed mobility: Needs Assistance Bed Mobility: Supine to Sit     Supine to sit: Contact guard, HOB elevated     General bed mobility comments: Increased time, cues for technique    Transfers Overall transfer level: Needs assistance Equipment used: Rolling walker (2 wheels) Transfers: Sit to/from Stand, Bed to chair/wheelchair/BSC Sit to Stand: Mod assist, +2 safety/equipment   Step pivot transfers: Mod assist, +2 safety/equipment       General transfer comment: required verbal cues for hand placement, blocked feet as pt with LEs moving forwards with attempted rise, assist to rise, stabilize and control descent    Ambulation/Gait                  Stairs            Wheelchair Mobility     Tilt Bed    Modified Rankin (Stroke Patients Only)       Balance Overall balance assessment: Needs assistance         Standing balance support: Bilateral upper extremity supported, Reliant on assistive device for balance, During functional activity Standing balance-Leahy Scale: Poor                               Pertinent Vitals/Pain Pain Assessment Pain Assessment: No/denies pain    Home Living Family/patient expects to be discharged to:: Private residence Living  Arrangements: Spouse/significant other Available Help at Discharge: Family;Available 24 hours/day Type of Home: House Home Access: Ramped entrance       Home Layout: One level Home Equipment: Rolling Walker (2 wheels);BSC/3in1;Cane - single point;Wheelchair - manual;Hospital bed;Tub bench Additional Comments: walker basket. Recent bouts of hepatic encephalopathy.    Prior Function Prior Level of Function : Independent/Modified Independent;Needs assist  Cognitive  Assist : ADLs (cognitive)           Mobility Comments: mod I with intermittent use SPC for household and community navigation ADLs Comments: varies depending on cognition; typically pt is MOD I, spouse manages medications and bills     Extremity/Trunk Assessment        Lower Extremity Assessment Lower Extremity Assessment: Generalized weakness    Cervical / Trunk Assessment Cervical / Trunk Assessment: Normal  Communication   Communication Communication: Impaired Factors Affecting Communication: Difficulty expressing self    Cognition Arousal: Alert Behavior During Therapy: WFL for tasks assessed/performed                           PT - Cognition Comments: cognition not at baseline per spouse however presenting similar to other episodes of hepatic encephalopathy Following commands: Intact       Cueing Cueing Techniques: Verbal cues     General Comments      Exercises     Assessment/Plan    PT Assessment Patient needs continued PT services  PT Problem List Decreased activity tolerance;Decreased mobility;Decreased balance;Decreased knowledge of use of DME;Decreased safety awareness;Decreased cognition       PT Treatment Interventions Patient/family education;Therapeutic exercise;Gait training;DME instruction;Functional mobility training;Therapeutic activities;Balance training    PT Goals (Current goals can be found in the Care Plan section)  Acute Rehab PT Goals PT Goal Formulation: With patient/family Time For Goal Achievement: 07/15/24 Potential to Achieve Goals: Good    Frequency Min 2X/week     Co-evaluation               AM-PAC PT 6 Clicks Mobility  Outcome Measure Help needed turning from your back to your side while in a flat bed without using bedrails?: A Little Help needed moving from lying on your back to sitting on the side of a flat bed without using bedrails?: A Little Help needed moving to and from a bed to a chair  (including a wheelchair)?: A Lot Help needed standing up from a chair using your arms (e.g., wheelchair or bedside chair)?: A Lot Help needed to walk in hospital room?: A Lot Help needed climbing 3-5 steps with a railing? : A Lot 6 Click Score: 14    End of Session Equipment Utilized During Treatment: Gait belt Activity Tolerance: Patient tolerated treatment well Patient left: in chair;with call bell/phone within reach;with chair alarm set;with family/visitor present   PT Visit Diagnosis: Difficulty in walking, not elsewhere classified (R26.2);Muscle weakness (generalized) (M62.81)    Time: 8593-8576 PT Time Calculation (min) (ACUTE ONLY): 17 min   Charges:   PT Evaluation $PT Eval Low Complexity: 1 Low   PT General Charges $$ ACUTE PT VISIT: 1 Visit       Tari PT, DPT Physical Therapist Acute Rehabilitation Services Office: [phone]  Tari CROME Payson 07/01/2024, 3:36 PM

## 2024-07-01 NOTE — ED Notes (Signed)
 Temp now 98.60f Oral. Bair Hugger turned off.   Provider messaged to downgrade pt due to temp of 98.38F.

## 2024-07-01 NOTE — ED Notes (Signed)
 Bair hugger applied, level of care changed by physician

## 2024-07-01 NOTE — ED Notes (Signed)
 Pt's husband present at bedside - states pt was called by PCP for elevated Ammonia levels at check up yesterday. Husband states she had total  of of Lactulose , and large BM around 5:30pm last night, but her altered mental status has worsened throughout the night.

## 2024-07-01 NOTE — H&P (Signed)
 History and Physical  [name] FMW:[id number] DOB: [birth year] DOA: 07/01/2024  Referring physician: Dr. Raford, EDP  PCP: Katherine Lynwood HERO, NP  Outpatient Specialists: GI, hepatology. Patient coming from: Home  Chief Complaint: Altered mental status.  HPI: [name] is a 73 y.o. female with medical history significant for cirrhosis, prior hepatic encephalopathy, acquired hypothyroidism, who presents to the ER, sent from her PCPs office due to altered mental status and elevated ammonia level.  The patient's husband at bedside reports, since Tuesday, 5 days ago, the patient has been unlike herself and feeling generally weaker.  No reported subjective fevers.  She went to her primary care provider yesterday, ammonia level was checked and the results were elevated.  The patient went home and took multiple doses of lactulose  with good results, that led to multiple bowel movements.  They were advised to go to the ER for further management.  In the ER, hypothermic 92.7 degrees Fahrenheit, ammonia level improved 48 from 83.  Per her husband at bedside she is not back to her baseline.  UA was negative for pyuria.  Lab studies notable for leukopenia.  EDP requested admission for further management of AMS.  Admitted by [facility], hospitalist service.  ED Course: Temperature 92.7, repeat 93.6 after application of multiple blankets.  BP 158/84, pulse 72, respiratory 18, O2 saturation 97% on room air.  Lab studies notable for WBC 2.4, hemoglobin 10.9, platelet count 139.  Neutrophil count 1.4.  Serum glucose 122.  Alkaline phosphatase 212, albumin  3.1, AST 89, ALT 54, ammonia level 48, total bilirubin 1.8.  Review of Systems: Review of systems as noted in the HPI. All other systems reviewed and are negative.   Past Medical History:  Diagnosis Date   Acute hepatic encephalopathy (HCC) 03/30/2024   Acute urinary retention 05/04/2022   Allergy 2006 ?   Contrast dye   Arthritis 2016 ??   Knees and thumb    Cancer [facility])    cecum   Cataract 2021   Surgery scheduled July 2023   Colon cancer [facility]) 2003   Elevated liver function tests    Esophageal varices (HCC)    Heart murmur On file   Hemorrhage of gastrointestinal tract 05/04/2011   Hypertension 2021   Hypothyroidism    Iron deficiency anemia    Liver disease    chemotherapy complication, per pt, shunts placed to bypass liver   Malignant neoplasm of cecum (HCC)    Portal hypertension (HCC)    Skin cancer 2019   Splenomegaly    Past Surgical History:  Procedure Laterality Date   COLON SURGERY  2004   Cancer   COSMETIC SURGERY  2021   Skin cancer   ESOPHAGEAL VARICE LIGATION     ESOPHAGOGASTRODUODENOSCOPY N/A 05/28/2024   Procedure: EGD (ESOPHAGOGASTRODUODENOSCOPY);  Surgeon: Albertus Gordy HERO, MD;  Location: [facility];  Service: Gastroenterology;  Laterality: N/A;   EYE SURGERY     HEMICOLECTOMY  01/08/2003   IR RADIOLOGIST EVAL & MGMT  12/20/2020   IR RADIOLOGIST EVAL & MGMT  05/29/2021   LIVER SURGERY     shunts placed after chemo complication   SKIN FULL THICKNESS GRAFT N/A 09/12/2019   Procedure: debridement and FTSG to the nose from left upper arm;  Surgeon: Elisabeth Craig RAMAN, MD;  Location:  SURGERY CENTER;  Service: Plastics;  Laterality: N/A;  2 hours, please   TIPS PROCEDURE      Social History:  reports that she has never smoked. She has  never used smokeless tobacco. She reports that she does not drink alcohol  and does not use drugs.   Allergies  Allergen Reactions   Contrast Media [Iodinated Contrast Media] Hives    Family History  Problem Relation Age of Onset   Arthritis Mother    Hearing loss Mother    Heart disease Mother    Hypertension Mother    Miscarriages / [place] Mother    Arthritis Father    Diabetes Father    Heart disease Father    Cancer Maternal Aunt    Breast cancer Neg Hx    Colon cancer Neg Hx    Esophageal cancer Neg Hx    Pancreatic cancer Neg Hx    Stomach cancer  Neg Hx    Stroke Neg Hx       Prior to Admission medications   Medication Sig Start Date End Date Taking? Authorizing Provider  Accu-Chek Softclix Lancets lancets 3 (three) times daily. 04/05/24   [provider]  aspirin  EC 81 MG tablet Take 1 tablet (81 mg total) by mouth daily. Swallow whole. 12/26/23   Bryn Bernardino NOVAK, MD  Blood Glucose Monitoring Suppl DEVI 1 each by Does not apply route in the morning, at noon, and at bedtime. May substitute to any manufacturer covered by patient's insurance. 04/05/24   Rojelio Nest, DO  CRANBERRY PO Take 1 tablet by mouth 2 (two) times daily.    [provider]  feeding supplement (ENSURE PLUS HIGH PROTEIN) LIQD Take 237 mLs by mouth 2 (two) times daily between meals. 06/20/24 07/20/24  Darci Pore, MD  ferrous sulfate  325 (65 FE) MG tablet Take 1 tablet (325 mg total) by mouth 2 (two) times daily with a meal. 05/28/24 08/26/24  Shahmehdi, Adriana LABOR, MD  furosemide  (LASIX ) 20 MG tablet Take 20-40 mg by mouth See admin instructions. Take 40 mg by mouth every morning. Decrease the dose to 20 mg if the blood pressure is low.    [provider]  hydrocortisone  (ANUSOL -HC) 2.5 % rectal cream Place 1 Application rectally 2 (two) times daily. 06/30/24   Katherine Lynwood HERO, NP  lactulose  (CHRONULAC ) 10 GM/15ML SOLN enema Place 300 mLs rectally 2 (two) times daily as needed for severe constipation. 06/10/24   Rizwan, Saima, MD  lactulose  (CHRONULAC ) 10 GM/15ML solution Take 45 mLs (30 g total) by mouth 4 (four) times daily. 06/10/24   Rizwan, Saima, MD  levOCARNitine  (CARNITOR ) 330 MG tablet Take 330 mg by mouth See admin instructions. Take 330 mg by mouth with breakfast, between 3-5 PM, and at bedtime 05/23/22   [provider]  levothyroxine  (SYNTHROID ) 50 MCG tablet Take 1.5 tablets (75 mcg total) by mouth daily before breakfast. 06/10/24   Earley Saucer, MD  liver oil-zinc  oxide (DESITIN) 40 % ointment Apply topically daily as needed for  irritation. 03/13/23   Pokhrel, Vernal, MD  magnesium  oxide (MAG-OX) 400 MG tablet Take 1 tablet (400 mg total) by mouth daily. Patient taking differently: Take 400 mg by mouth in the morning. 04/05/24   Rojelio Nest, DO  midodrine  (PROAMATINE ) 10 MG tablet Take 1 tablet (10 mg total) by mouth 3 (three) times daily with meals. 06/20/24 08/19/24  Darci Pore, MD  Multiple Vitamin (MULTI-VITAMIN) tablet Take 1 tablet by mouth in the morning.    [provider]  pantoprazole  (PROTONIX ) 40 MG tablet Take 1 tablet (40 mg total) by mouth 2 (two) times daily. Patient taking differently: Take 40 mg by mouth 2 (two) times daily  before a meal. 05/28/24 06/30/24  Shahmehdi, Adriana LABOR, MD  Phenylephrine  HCl (PREPARATION H RE) Place 1 Dose rectally as needed (hemorrhoids).    [provider]  polyethylene glycol (MIRALAX  / GLYCOLAX ) 17 g packet Take 17 g by mouth daily. 06/10/24   Rizwan, Saima, MD  potassium chloride  SA (KLOR-CON  M) 20 MEQ tablet Take 1 tablet (20 mEq total) by mouth daily. 06/20/24   Darci Pore, MD  Probiotic Product (PROBIOTIC PEARLS WOMENS) CAPS Take 1 capsule by mouth in the morning.    [provider]  PROTEIN PO Take 1 Bottle by mouth See admin instructions. Fair Life Nutrition Protein Shake- Drink 1 bottle by mouth daily if she can stomach it.    [provider]  rifaximin  (XIFAXAN ) 550 MG TABS tablet Take 1 tablet (550 mg total) by mouth 2 (two) times daily. Patient taking differently: Take 550 mg by mouth in the morning and at bedtime. 12/25/23   Bryn Bernardino NOVAK, MD  spironolactone  (ALDACTONE ) 50 MG tablet Take 1 tablet (50 mg total) by mouth daily. 06/21/24   Darci Pore, MD    Physical Exam: BP (!) 170/92 (BP Location: Left Arm)   Pulse 78   Resp 14   SpO2 94%   General: 73 y.o. year-old female well developed well nourished in no acute distress.  Alert and oriented x2. Cardiovascular: Regular rate and rhythm with no rubs or  gallops.  No thyromegaly or JVD noted.  2+ pitting edema in lower extremities bilaterally. Respiratory: Faint rales at bases.  Poor inspiratory effort. Abdomen: Soft nontender nondistended with normal bowel sounds x4 quadrants. Muskuloskeletal: No cyanosis or clubbing noted bilaterally Neuro: CN II-XII intact, strength, sensation, reflexes Skin: No ulcerative lesions noted or rashes Psychiatry: Judgement and insight appear altered. Mood is appropriate for condition and setting          Labs on Admission:  Basic Metabolic Panel: Recent Labs  Lab 06/30/24 1145 07/01/24 0330  NA 138 136  K 4.6 4.3  CL 99 101  CO2 32 22  GLUCOSE 88 122*  BUN 12 12  CREATININE 1.24* 0.89  CALCIUM  9.6 9.8   Liver Function Tests: Recent Labs  Lab 06/30/24 1145 07/01/24 0330  AST 77* 89*  ALT 50* 54*  ALKPHOS 231* 212*  BILITOT 1.4* 1.8*  PROT 6.3 6.8  ALBUMIN  3.3* 3.1*   No results for input(s): LIPASE, AMYLASE in the last 168 hours. Recent Labs  Lab 06/30/24 1145 07/01/24 0330  AMMONIA 83* 48*   CBC: Recent Labs  Lab 06/30/24 1145 07/01/24 0330  WBC 3.4* 2.4*  NEUTROABS  --  1.4*  HGB 11.0* 10.9*  HCT 32.8* 34.6*  MCV 99.2 102.1*  PLT 151.0 139*   Cardiac Enzymes: No results for input(s): CKTOTAL, CKMB, CKMBINDEX, TROPONINI in the last 168 hours.  BNP (last 3 results) No results for input(s): BNP in the last 8760 hours.  ProBNP (last 3 results) Recent Labs    08/10/23 0946  PROBNP 45.0    CBG: No results for input(s): GLUCAP in the last 168 hours.  Radiological Exams on Admission: No results found.  EKG: I independently viewed the EKG done and my findings are as followed: None available at the time of this visit.  Assessment/Plan Present on Admission:  AMS (altered mental status)  Principal Problem:   AMS (altered mental status)  Altered mental status, acute hepatic encephalopathy, POA, improving Ammonia level improved to 43 from 87 UA  negative for pyuria Reorient as needed  Fall and aspiration precautions  SIRS with concern for possible infection Hypothymia with core temperature of 92.7 Leukopenia with WBC of 2.4 Negative MRSA screening test on 05/21/2024 Follow peripheral blood cultures x 2, chest x-ray, and abdominal x-ray Rocephin  and IV Flagyl  added until infection could be ruled out  Generalized weakness PT/OT evaluation At baseline uses a cane to ambulate Continue fall precautions  Acquired hypothyroidism TSH 6.61, free T4 1.11, On 06/30/2024 Resume home levothyroxine   Hypothermia Rectal temperature 93.2 Bair hugger  Elevated LFTs in the setting of cirrhosis Monitor for now Avoid hepatotoxic agents.  Coagulopathy in the setting of cirrhosis INR 1.5 on 07/01/2024.  Hepatic cirrhosis Resume home regimen    Critical care time: 65 minutes.    DVT prophylaxis: SCDs.  Code Status: Full code.  Family Communication: Husband at bedside.  Disposition Plan: Admitted to stepdown unit due to hypothermia on Bair hugger.  Consults called: None.  Admission status: Inpatient status.   Status is: Inpatient The patient requires at least 2 midnights for further evaluation and treatment of present condition.    Terry LOISE Hurst MD [facility] Pager [phone]  If 7PM-7AM, please contact night-coverage [email or web address] Password [email or web address]  07/01/2024, 4:38 AM

## 2024-07-02 DIAGNOSIS — R4182 Altered mental status, unspecified: Secondary | ICD-10-CM | POA: Diagnosis not present

## 2024-07-02 LAB — CBC
HCT: 30.3 % — ABNORMAL LOW (ref 36.0–46.0)
Hemoglobin: 9.5 g/dL — ABNORMAL LOW (ref 12.0–15.0)
MCH: 33 pg (ref 26.0–34.0)
MCHC: 31.4 g/dL (ref 30.0–36.0)
MCV: 105.2 fL — ABNORMAL HIGH (ref 80.0–100.0)
Platelets: 126 K/uL — ABNORMAL LOW (ref 150–400)
RBC: 2.88 MIL/uL — ABNORMAL LOW (ref 3.87–5.11)
RDW: 21.6 % — ABNORMAL HIGH (ref 11.5–15.5)
WBC: 2.7 K/uL — ABNORMAL LOW (ref 4.0–10.5)
nRBC: 0 % (ref 0.0–0.2)

## 2024-07-02 LAB — COMPREHENSIVE METABOLIC PANEL WITH GFR
ALT: 42 U/L (ref 0–44)
AST: 65 U/L — ABNORMAL HIGH (ref 15–41)
Albumin: 2.6 g/dL — ABNORMAL LOW (ref 3.5–5.0)
Alkaline Phosphatase: 167 U/L — ABNORMAL HIGH (ref 38–126)
Anion gap: 9 (ref 5–15)
BUN: 12 mg/dL (ref 8–23)
CO2: 23 mmol/L (ref 22–32)
Calcium: 8.8 mg/dL — ABNORMAL LOW (ref 8.9–10.3)
Chloride: 107 mmol/L (ref 98–111)
Creatinine, Ser: 1.09 mg/dL — ABNORMAL HIGH (ref 0.44–1.00)
GFR, Estimated: 54 mL/min — ABNORMAL LOW (ref >60)
Glucose, Bld: 87 mg/dL (ref 70–99)
Potassium: 4.2 mmol/L (ref 3.5–5.1)
Sodium: 139 mmol/L (ref 135–145)
Total Bilirubin: 1.2 mg/dL (ref 0.0–1.2)
Total Protein: 5.5 g/dL — ABNORMAL LOW (ref 6.5–8.1)

## 2024-07-02 LAB — AMMONIA: Ammonia: 116 umol/L — ABNORMAL HIGH (ref 9–35)

## 2024-07-02 MED ORDER — LACTULOSE ENEMA
300.0000 mL | Freq: Once | ORAL | Status: AC
  Administered 2024-07-02: 300 mL via RECTAL
  Filled 2024-07-02: qty 300

## 2024-07-02 NOTE — Progress Notes (Signed)
 PROGRESS NOTE  [name] FMW:[id number] DOB: [birth year] DOA: 07/01/2024 PCP: [name], NP   LOS: 1 day   Brief narrative:  [name] is a 73 y.o. female with past medical history significant for cirrhosis, prior hepatic encephalopathy, acquired hypothyroidism, presented to hospital with altered mental status and elevated ammonia level for 5 days with generalized weakness.  She had gone to her primary care provider day prior to presentation when ammonia levels were noted to be elevated and she took multiple doses of lactulose  with good results, that led to multiple bowel movements.  They were advised to go to the ER for further management.  In the ED patient was noted to be hypothermic with temperature of 92.7 F.  Ammonia level had improved to 248 from 83.  Urinalysis was negative for pyuria.  CBC showed leukopenia with WBC count at 2.4.  LFTs slightly elevated with AST 89 ALT 54 and albumin  was 3.1 with total bilirubin 1.8.  Patient was still not at her baseline so was considered for admission to the hospital for further evaluation and treatment.      Assessment/Plan: Principal Problem:   AMS (altered mental status) Active Problems:   Altered mental status   Altered mental status, acute hepatic encephalopathy,  Ammonia level improved to 43 from 87 with therapeutic doses of lactulose  and now again up trended to 116.  Has had some bowel movement yesterday but I believe patient will need good bowel movements..  Continue lactulose  and rifaximin ..  Will add lactulose  enema today.  Urinalysis without any pyuria.  Chest x-ray without any infiltrate.  Electrolytes within normal range.  Nonfocal weakness.  Little more alert than yesterday but is still disoriented.   SIRS with concern for possible infection Hypothymia with core temperature of 92.7 Fahrenheit on presentation with leukopenia.  Follow blood cultures.  Patient saturating well on room air.  Has leukopenia but has liver  disease.  Chest x-ray without any infiltrate.  Abdominal x-ray was unremarkable.  Empirically on Rocephin  and Flagyl  at this time.  Will de-escalate if cultures negative by a.m in 48 hours.   Generalized weakness PT OT recommend outpatient PT on discharge.   Acquired hypothyroidism TSH 6.61, free T4 1.11, On 06/30/2024 continue Synthroid    Hypothermia Improving.  Latest temperature of 97.3 F.  Patient's husband says that she uses heat packs at home when she feels cold.   Elevated LFTs, coagulopathy secondary to hepatic cirrhosis Continue supportive care.Continue to monitor LFTs.  DVT prophylaxis: SCDs Start: 07/01/24 0439   Disposition: Home likely in 1 to 2 days if mental Improved.  Status is: Inpatient Remains inpatient appropriate because: Altered mental status, generalized weakness, hepatic encephalopathy, pending clinical improvement    Code Status:     Code Status: Full Code  Family Communication: Spoke with the husband at bedside again today at bedside  Consultants: None  Procedures: None  Anti-infectives:  Rifaximin  Rocephin  and Flagyl   Anti-infectives (From admission, onward)    Start     Dose/Rate Route Frequency Ordered Stop   07/01/24 1000  rifaximin  (XIFAXAN ) tablet 550 mg        550 mg Oral 2 times daily 07/01/24 0605     07/01/24 0600  cefTRIAXone  (ROCEPHIN ) 1 g in sodium chloride  0.9 % 100 mL IVPB        1 g 200 mL/hr over 30 Minutes Intravenous Every 24 hours 07/01/24 0553     07/01/24 0600  metroNIDAZOLE  (FLAGYL ) IVPB 500 mg  500 mg 100 mL/hr over 60 Minutes Intravenous Every 12 hours 07/01/24 0553          Subjective: Today, patient was seen and examined at bedside.  Had bowel movement yesterday but still ammonia high and sluggish.  Does not quite appear to be at her baseline.  Patient has been at bedside.  Patient Communicative but disoriented at times.  Objective: Vitals:   07/01/24 2321 07/02/24 0328  BP: (!) 140/79 (!) 142/60   Pulse: 72 89  Resp: 16 18  Temp: 98.3 F (36.8 C) 98.2 F (36.8 C)  SpO2: 94% 95%    Intake/Output Summary (Last 24 hours) at 07/02/2024 1134 Last data filed at 07/02/2024 1015 Gross per 24 hour  Intake 460 ml  Output 1 ml  Net 459 ml   Filed Weights   07/01/24 1030  Weight: 69 kg   Body mass index is 26.95 kg/m.   Physical Exam: GENERAL: Patient is alert awake and Communicative, oriented to self and place.  Not in obvious distress.  Slow to respond. HENT: No scleral pallor or icterus. Pupils equally reactive to light. Oral mucosa is moist NECK: is supple, no gross swelling noted. CHEST: Clear to auscultation. No crackles or wheezes.  Diminished breath sounds bilaterally. CVS: S1 and S2 heard, no murmur. Regular rate and rhythm.  ABDOMEN: Soft, bowel sounds are present.  Nontender on palpation EXTREMITIES: No edema. CNS: Cranial nerves are intact.  Moves all extremities, generalized weakness noted SKIN: warm and dry without rashes.  Data Review: I have personally reviewed the following laboratory data and studies,  CBC: Recent Labs  Lab 06/30/24 1145 07/01/24 0330 07/02/24 0445  WBC 3.4* 2.4* 2.7*  NEUTROABS  --  1.4*  --   HGB 11.0* 10.9* 9.5*  HCT 32.8* 34.6* 30.3*  MCV 99.2 102.1* 105.2*  PLT 151.0 139* 126*   Basic Metabolic Panel: Recent Labs  Lab 06/30/24 1145 07/01/24 0330 07/02/24 0445  NA 138 136 139  K 4.6 4.3 4.2  CL 99 101 107  CO2 32 22 23  GLUCOSE 88 122* 87  BUN 12 12 12   CREATININE 1.24* 0.89 1.09*  CALCIUM  9.6 9.8 8.8*   Liver Function Tests: Recent Labs  Lab 06/30/24 1145 07/01/24 0330 07/02/24 0445  AST 77* 89* 65*  ALT 50* 54* 42  ALKPHOS 231* 212* 167*  BILITOT 1.4* 1.8* 1.2  PROT 6.3 6.8 5.5*  ALBUMIN  3.3* 3.1* 2.6*   No results for input(s): LIPASE, AMYLASE in the last 168 hours. Recent Labs  Lab 06/30/24 1145 07/01/24 0330 07/02/24 0445  AMMONIA 83* 48* 116*   Cardiac Enzymes: No results for input(s):  CKTOTAL, CKMB, CKMBINDEX, TROPONINI in the last 168 hours. BNP (last 3 results) No results for input(s): BNP in the last 8760 hours.  ProBNP (last 3 results) Recent Labs    08/10/23 0946  PROBNP 45.0    CBG: No results for input(s): GLUCAP in the last 168 hours. Recent Results (from the past 240 hours)  Culture, blood (Routine X 2) w Reflex to ID Panel     Status: None (Preliminary result)   Collection Time: 07/01/24 10:33 AM   Specimen: BLOOD RIGHT ARM  Result Value Ref Range Status   Specimen Description   Final    BLOOD RIGHT ARM Performed at [facility], [address], [place], [place] [address]    Special Requests   Final    BOTTLES DRAWN AEROBIC ONLY Blood Culture results may not be optimal  due to an inadequate volume of blood received in culture bottles Performed at [facility], [address] [place], [place] [address]    Culture   Final    NO GROWTH < 24 HOURS Performed at [facility], [address], [place], [place] [address]    Report Status PENDING  Incomplete  Culture, blood (Routine X 2) w Reflex to ID Panel     Status: None (Preliminary result)   Collection Time: 07/01/24 10:33 AM   Specimen: BLOOD RIGHT HAND  Result Value Ref Range Status   Specimen Description   Final    BLOOD RIGHT HAND Performed at [facility], [address] [place], [place] [address]    Special Requests   Final    BOTTLES DRAWN AEROBIC ONLY Blood Culture results may not be optimal due to an inadequate volume of blood received in culture bottles Performed at [facility], [address] [place], [place] [address]    Culture   Final    NO GROWTH < 24 HOURS Performed at [facility], [address] [place], [place] [address]    Report Status PENDING  Incomplete     Studies: DG CHEST PORT 1 VIEW Result Date: 07/01/2024 CLINICAL DATA:  [id number]. Systemic inflammatory response syndrome with mental status  changes and elevated ammonia levels. EXAM: PORTABLE CHEST 1 VIEW COMPARISON:  Portable chest 05/20/2024 FINDINGS: The lungs are clear of infiltrates. There is linear scarring or atelectasis in both lower lung fields. There is no substantial pleural effusion. There is mild cardiomegaly but no evidence of CHF. The mediastinum is normally outlined. There is calcification in the transverse aorta. There is thoracic spondylosis with no new osseous findings. Again noted is a partially visible TIPS stent. Compare: Overall aeration seems unchanged. IMPRESSION: 1. No evidence of acute chest disease. 2. Mild cardiomegaly. 3. Aortic atherosclerosis. 4. TIPS stent. Electronically Signed   By: Francis Quam M.D.   On: 07/01/2024 06:53   DG Abd Portable 1V Result Date: 07/01/2024 EXAM: 1 VIEW XRAY OF THE ABDOMEN 07/01/2024 06:09:00 AM COMPARISON: 1 view abdomen 04/03/24. CLINICAL HISTORY: History of hypertension, colon cancer, cirrhosis of the liver with esophageal varices, and hepatic encephalopathy. Patient brought in due to elevated ammonia level and mental status change. Family member reports patient was slightly off yesterday, worse this morning, and became much more confused during the day. FINDINGS: BOWEL: Nonobstructive bowel gas pattern. SOFT TISSUES: No abnormal calcifications or opaque urinary calculi. BONES: Mild degenerative changes are present in the left hip. No acute osseous abnormality. VASCULATURE: Portosystemic shunt is in place. IMPRESSION: 1. No acute findings. Electronically signed by: Lonni Necessary MD 07/01/2024 06:51 AM EDT RP Workstation: [id number]      Vernal Alstrom, MD  [facility] 07/02/2024  If 7PM-7AM, please contact night-coverage

## 2024-07-02 NOTE — Evaluation (Signed)
 Occupational Therapy Evaluation Patient Details Name: [name] MRN: [id number] DOB: 04-21-1951 Today's Date: 07/02/2024   History of Present Illness   73 year old female presented to hospital with altered mental status and elevated ammonia level for 5 days with generalized weakness.  Past medical history significant for cirrhosis due to NASH, portal hypertension, esophageal varices, s/p TIPS, multiple admissions for hepatic encephalopathy, acute CVA 03/2024.     Clinical Impressions PTA, patient lives at home with spouse, multiple hospitalizations the past 6 months requiring A for amb and BADL's with DME and was attending OPPT services on [address] Patient known to this OT from recent hospital stay and is below baseline cognition and functional level. Currently, patient presents with deficits outlined below (see OT Problem List for details) most significantly generalized muscle weakness, decreased coordination, cognition, activity tolerance and balance for BADL's and functional mobility. This evaluation limited by patient's fatigue level and nausea, spouse present for session with preference for OPOT if able upon discharge with his assistance and support with his request for a BSC due to bowel incontinence episodes.  Patient requires continued Acute care hospital level OT services to progress safety and functional performance and allow for discharge.       If plan is discharge home, recommend the following:   A little help with walking and/or transfers;A little help with bathing/dressing/bathroom;Direct supervision/assist for financial management;Supervision due to cognitive status;Assist for transportation;Assistance with feeding;Direct supervision/assist for medications management;Help with stairs or ramp for entrance     Functional Status Assessment   Patient has had a recent decline in their functional status and demonstrates the ability to make significant improvements in function in  a reasonable and predictable amount of time.     Equipment Recommendations   BSC/3in1 (requires due to medical reasons)      Precautions/Restrictions   Precautions Precautions: Fall Recall of Precautions/Restrictions: Impaired Precaution/Restrictions Comments: cognition not at baseline per spouse Restrictions Weight Bearing Restrictions Per Provider Order: No     Mobility Bed Mobility Overal bed mobility: Needs Assistance Bed Mobility: Supine to Sit     Supine to sit: Contact guard, HOB elevated Sit to supine: Contact guard assist, HOB elevated   General bed mobility comments: Increased time, cues for technique    Transfers Overall transfer level:  (unable to tolerate transfer due to fatigue)                        Balance Overall balance assessment: Needs assistance Sitting-balance support: Bilateral upper extremity supported Sitting balance-Leahy Scale: Good Sitting balance - Comments: posterior lean with fatigue, corrects with cues Postural control: Posterior lean                                 ADL either performed or assessed with clinical judgement   ADL Overall ADL's : Needs assistance/impaired Eating/Feeding: Sitting;Minimal assistance Eating/Feeding Details (indicate cue type and reason): unable to tolerate prolonged task this session, some trmor noted with soup and spoon to mouth Grooming: Wash/dry hands;Wash/dry face;Bed level;Contact guard assist Grooming Details (indicate cue type and reason): sitting EOB Upper Body Bathing: Moderate assistance;Bed level       Upper Body Dressing : Moderate assistance;Bed level         Toilet Transfer Details (indicate cue type and reason): nurse and husband just report patient came off [facility] post enema and BM and too fatigued for further mobility Toileting- Clothing  Manipulation and Hygiene: Maximal assistance         General ADL Comments: increased time, processing and cues need  for BADL's this session needed     Vision Baseline Vision/History: 0 No visual deficits              Pertinent Vitals/Pain Pain Assessment Pain Assessment: Faces Faces Pain Scale: No hurt Breathing: normal Negative Vocalization: none Facial Expression: smiling or inexpressive Body Language: relaxed Consolability: no need to console PAINAD Score: 0     Extremity/Trunk Assessment Upper Extremity Assessment Upper Extremity Assessment: Right hand dominant;Generalized weakness;RUE deficits/detail;LUE deficits/detail RUE Coordination: decreased fine motor;decreased gross motor LUE Coordination: decreased fine motor;decreased gross motor       Cervical / Trunk Assessment Cervical / Trunk Assessment: Normal   Communication Communication Communication: Impaired Factors Affecting Communication: Difficulty expressing self   Cognition Arousal: Alert Behavior During Therapy: WFL for tasks assessed/performed Cognition: Cognition impaired     Awareness: Intellectual awareness impaired Memory impairment (select all impairments): Short-term memory, Working memory Attention impairment (select first level of impairment): Sustained attention Executive functioning impairment (select all impairments): Initiation, Organization, Sequencing, Reasoning, Problem solving OT - Cognition Comments: pt requires increased time for all tasks, slow processing. pt is not at normal cognitive baseline per spouse (this is typical for her encephalolathic presentation while hospitalized, she flucuates with mental status), extremely fatigued post-enema BM on commode earlier                 Following commands: Intact       Cueing  General Comments   Cueing Techniques: Verbal cues  limited activity tolerance post BM on commode to bed level eval           Home Living Family/patient expects to be discharged to:: Private residence Living Arrangements: Spouse/significant other Available Help  at Discharge: Family;Available 24 hours/day Type of Home: House Home Access: Ramped entrance     Home Layout: One level     Bathroom Shower/Tub: Tub/shower unit;Curtain   Firefighter: Standard Bathroom Accessibility: Yes How Accessible: Accessible via walker Home Equipment: Rolling Walker (2 wheels);BSC/3in1;Cane - single point;Wheelchair - manual;Hospital bed;Tub bench   Additional Comments: walker basket. Recent bouts of hepatic encephalopathy.      Prior Functioning/Environment Prior Level of Function : Independent/Modified Independent;Needs assist  Cognitive Assist : ADLs (cognitive)   ADLs (Cognitive): Set up cues Physical Assist : Mobility (physical);ADLs (physical)     Mobility Comments: mod I with intermittent use SPC for household and community navigation ADLs Comments: varies depending on cognition; typically pt is MOD I, spouse manages medications and bills    OT Problem List: Decreased strength;Decreased knowledge of use of DME or AE;Decreased coordination;Decreased cognition;Decreased activity tolerance;Impaired balance (sitting and/or standing);Decreased safety awareness   OT Treatment/Interventions: Self-care/ADL training;Therapeutic exercise;Patient/family education;Neuromuscular education;Balance training;Energy conservation;Therapeutic activities;DME and/or AE instruction;Cognitive remediation/compensation      OT Goals(Current goals can be found in the care plan section)   Acute Rehab OT Goals Patient Stated Goal: unable OT Goal Formulation: With patient/family Time For Goal Achievement: 06/22/24 Potential to Achieve Goals: Fair ADL Goals Pt Will Perform Lower Body Bathing: with contact guard assist;sit to/from stand Pt Will Perform Lower Body Dressing: with contact guard assist;sit to/from stand Pt Will Transfer to Toilet: with supervision;ambulating;bedside commode;grab bars Pt Will Perform Toileting - Clothing Manipulation and hygiene: with  contact guard assist;sit to/from stand Pt/caregiver will Perform Home Exercise Program: Increased strength;Both right and left upper extremity;With written HEP provided;Independently   OT Frequency:  Min 2X/week       AM-PAC OT 6 Clicks Daily Activity     Outcome Measure Help from another person eating meals?: A Little Help from another person taking care of personal grooming?: A Little Help from another person toileting, which includes using toliet, bedpan, or urinal?: A Lot Help from another person bathing (including washing, rinsing, drying)?: A Lot Help from another person to put on and taking off regular upper body clothing?: A Lot Help from another person to put on and taking off regular lower body clothing?: A Lot 6 Click Score: 14   End of Session Nurse Communication: Mobility status  Activity Tolerance: Patient limited by fatigue Patient left: in bed;with family/visitor present;with bed alarm set  OT Visit Diagnosis: Unsteadiness on feet (R26.81);Muscle weakness (generalized) (M62.81);Other symptoms and signs involving cognitive function;Cognitive communication deficit (R41.841)                Time: 1455-1530 OT Time Calculation (min): 35 min Charges:  OT General Charges $OT Visit: 1 Visit OT Evaluation $OT Eval Low Complexity: 1 Low Kemani Heidel OT/L Acute Rehabilitation Department  [phone]  07/02/2024, 4:09 PM

## 2024-07-03 ENCOUNTER — Other Ambulatory Visit

## 2024-07-03 DIAGNOSIS — K7682 Hepatic encephalopathy: Secondary | ICD-10-CM

## 2024-07-03 LAB — COMPREHENSIVE METABOLIC PANEL WITH GFR
ALT: 37 U/L (ref 0–44)
AST: 55 U/L — ABNORMAL HIGH (ref 15–41)
Albumin: 2.4 g/dL — ABNORMAL LOW (ref 3.5–5.0)
Alkaline Phosphatase: 169 U/L — ABNORMAL HIGH (ref 38–126)
Anion gap: 7 (ref 5–15)
BUN: 13 mg/dL (ref 8–23)
CO2: 24 mmol/L (ref 22–32)
Calcium: 8.7 mg/dL — ABNORMAL LOW (ref 8.9–10.3)
Chloride: 110 mmol/L (ref 98–111)
Creatinine, Ser: 1.03 mg/dL — ABNORMAL HIGH (ref 0.44–1.00)
GFR, Estimated: 57 mL/min — ABNORMAL LOW (ref >60)
Glucose, Bld: 68 mg/dL — ABNORMAL LOW (ref 70–99)
Potassium: 3.9 mmol/L (ref 3.5–5.1)
Sodium: 141 mmol/L (ref 135–145)
Total Bilirubin: 1.2 mg/dL (ref 0.0–1.2)
Total Protein: 5.2 g/dL — ABNORMAL LOW (ref 6.5–8.1)

## 2024-07-03 LAB — CBC
HCT: 27.8 % — ABNORMAL LOW (ref 36.0–46.0)
Hemoglobin: 8.5 g/dL — ABNORMAL LOW (ref 12.0–15.0)
MCH: 31.8 pg (ref 26.0–34.0)
MCHC: 30.6 g/dL (ref 30.0–36.0)
MCV: 104.1 fL — ABNORMAL HIGH (ref 80.0–100.0)
Platelets: 114 K/uL — ABNORMAL LOW (ref 150–400)
RBC: 2.67 MIL/uL — ABNORMAL LOW (ref 3.87–5.11)
RDW: 21.6 % — ABNORMAL HIGH (ref 11.5–15.5)
WBC: 2.6 K/uL — ABNORMAL LOW (ref 4.0–10.5)
nRBC: 0 % (ref 0.0–0.2)

## 2024-07-03 LAB — MAGNESIUM: Magnesium: 1.9 mg/dL (ref 1.7–2.4)

## 2024-07-03 LAB — AMMONIA: Ammonia: 50 umol/L — ABNORMAL HIGH (ref 9–35)

## 2024-07-03 NOTE — Plan of Care (Signed)
  Problem: Clinical Measurements: Goal: Ability to maintain clinical measurements within normal limits will improve Outcome: Progressing   Problem: Activity: Goal: Risk for activity intolerance will decrease Outcome: Progressing   Problem: Safety: Goal: Ability to remain free from injury will improve Outcome: Progressing   

## 2024-07-03 NOTE — TOC Transition Note (Signed)
 Transition of Care [facility]) - Discharge Note  Patient Details  Name: [name] MRN: [id number] Date of Birth: [birth year]  Transition of [facility]) CM/SW Contact:  Duwaine GORMAN Aran, LCSW Phone Number: 07/03/2024, 10:28 AM  Clinical Narrative: PT evaluation recommended OPPT. CSW met with patient and spouse to discuss recommendation. [facility] was office of choice as patient has been there before. OPPT referral made. Care management signing off.  Final next level of care: OP Rehab Barriers to Discharge: Barriers Resolved  Patient Goals and CMS Choice Patient states their goals for this hospitalization and ongoing recovery are:: OPPT at [facility] Choice offered to / list presented to : NA  Discharge Plan and Services Additional resources added to the After Visit Summary for         DME Arranged: N/A DME Agency: NA  Social Drivers of Health (SDOH) Interventions SDOH Screenings   Food Insecurity: No Food Insecurity (07/01/2024)  Housing: Low Risk  (07/01/2024)  Transportation Needs: No Transportation Needs (07/01/2024)  Utilities: Not At Risk (07/01/2024)  Alcohol  Screen: Low Risk  (03/27/2024)  Depression (PHQ2-9): Low Risk  (05/11/2024)  Financial Resource Strain: Low Risk  (06/15/2024)  Physical Activity: Insufficiently Active (05/10/2024)  Social Connections: Socially Integrated (07/01/2024)  Stress: No Stress Concern Present (05/10/2024)  Tobacco Use: Low Risk  (07/01/2024)   Readmission Risk Interventions    05/29/2024   11:06 AM 05/22/2024    3:58 PM 03/31/2024    2:51 PM  Readmission Risk Prevention Plan  Transportation Screening Complete Complete Complete  PCP or Specialist Appt within 3-5 Days   Complete  HRI or Home Care Consult   Complete  Social Work Consult for Recovery Care Planning/Counseling   Complete  Palliative Care Screening   Not Applicable  Medication Review Oceanographer) Complete Complete Complete  PCP or Specialist appointment  within 3-5 days of discharge Complete    HRI or Home Care Consult Complete Complete   SW Recovery Care/Counseling Consult Complete Complete   Palliative Care Screening Not Applicable Not Applicable   Skilled Nursing Facility Not Applicable Not Applicable

## 2024-07-03 NOTE — Discharge Summary (Signed)
 Physician Discharge Summary  [name] FMW:[id number] DOB: Jul 21, 1951 DOA: 07/01/2024  PCP: [name], NP  Admit date: 07/01/2024 Discharge date: 07/03/2024  Admitted From: Home  Discharge disposition: home   Recommendations for Outpatient Follow-Up:   Follow up with your primary care provider in one week.  Follow-up with GI physician as scheduled by the clinic. Check CBC, BMP, magnesium  in the next visit Patient should be titrated for 3-4 bowel movements a day.  Discharge Diagnosis:   Principal Problem:   AMS (altered mental status) Active Problems:   Altered mental status   Discharge Condition: Improved.  Diet recommendation: Low sodium, heart healthy.   Wound care: None.  Code status: Full.   History of Present Illness:   [name] is a 73 y.o. female with past medical history significant for cirrhosis, prior hepatic encephalopathy, acquired hypothyroidism, presented to hospital with altered mental status and elevated ammonia level for 5 days with generalized weakness.  She had gone to her primary care provider day prior to presentation when ammonia levels were noted to be elevated and she took multiple doses of lactulose  with good results, that led to multiple bowel movements.  They were advised to go to the ER for further management.  In the ED, patient was noted to be hypothermic with temperature of 92.7 F.  Ammonia level had improved to 248 from 83.  Urinalysis was negative for pyuria.  CBC showed leukopenia with WBC count at 2.4.  LFTs slightly elevated with AST 89 ALT 54 and albumin  was 3.1 with total bilirubin 1.8.  Patient was still not at her baseline so was considered for admission to the hospital for further evaluation and treatment.    Hospital Course:   Following conditions were addressed during hospitalization as listed below,  Acute hepatic encephalopathy,  Patient is lactulose  responsive and sensitive to ammonia levels.  Lactulose  enema  and lactulose  were given with subsequent improvement in mental function.  Patient will also continue rifaximin .  There was no signs of infection including negative urine and chest x-ray.  Patient's husband wishes outpatient PT for the patient.  Discussed at length regarding the need for adequate bowel movements at home at least 3-4 times a day.     SIRS  Hypothymia with core temperature of 92.7 Fahrenheit on presentation with leukopenia.  Improved at this time.  No obvious signs of infection.  Was seen initially on Rocephin  and Flagyl .  Infection has been ruled out.  Blood cultures negative.  Generalized weakness PT OT recommend outpatient PT on discharge.  Denies home health PT.   Acquired hypothyroidism TSH 6.61, free T4 1.11, On 06/30/2024, continue Synthroid .  Check TSH levels as outpatient.   Hypothermia Improving.  Latest temperature of 99.3 F.  Patient's husband says that she uses heat packs at home when she feels cold.   Elevated LFTs, coagulopathy secondary to hepatic cirrhosis Continue supportive care.Continue to monitor LFTs.  Continue rifaximin  and lactulose  on discharge.   Disposition.  At this time, patient is stable for disposition home with outpatient PCP follow-up.  Spoke with the patient's husband at bedside prior to discharge.  Medical Consultants:   None.  Procedures:    None Subjective:   Today, patient was seen and examined at bedside.  Appears to more alert awake and Communicative.  Mental capacity at her baseline.  Discharge Exam:   Vitals:   07/02/24 2026 07/03/24 0414  BP: 130/60 132/74  Pulse: 93 79  Resp: 18 18  Temp: 98.2 F (36.8 C) 98.3 F (36.8 C)  SpO2: 95% 96%   Vitals:   07/02/24 0328 07/02/24 1401 07/02/24 2026 07/03/24 0414  BP: (!) 142/60 133/76 130/60 132/74  Pulse: 89 91 93 79  Resp: 18 16 18 18   Temp: 98.2 F (36.8 C) 99.2 F (37.3 C) 98.2 F (36.8 C) 98.3 F (36.8 C)  TempSrc: Oral Oral Oral Oral  SpO2: 95% 96% 95% 96%   Weight:      Height:        General: Alert awake, not in obvious distress, Communicative, at her baseline. HENT: pupils equally reacting to light,  No scleral pallor or icterus noted. Oral mucosa is moist.  Chest:  Clear breath sounds. No crackles or wheezes.  CVS: S1 &S2 heard. No murmur.  Regular rate and rhythm. Abdomen: Soft, nontender, nondistended.  Bowel sounds are heard.   Extremities: No cyanosis, clubbing or edema.  Peripheral pulses are palpable. Psych: Alert, awake and oriented, normal mood CNS:  No cranial nerve deficits.  Power equal in all extremities.  Generalized weakness noted. Skin: Warm and dry.  No rashes noted.  The results of significant diagnostics from this hospitalization (including imaging, microbiology, ancillary and laboratory) are listed below for reference.     Diagnostic Studies:   DG CHEST PORT 1 VIEW Result Date: 07/01/2024 CLINICAL DATA:  [id number]. Systemic inflammatory response syndrome with mental status changes and elevated ammonia levels. EXAM: PORTABLE CHEST 1 VIEW COMPARISON:  Portable chest 05/20/2024 FINDINGS: The lungs are clear of infiltrates. There is linear scarring or atelectasis in both lower lung fields. There is no substantial pleural effusion. There is mild cardiomegaly but no evidence of CHF. The mediastinum is normally outlined. There is calcification in the transverse aorta. There is thoracic spondylosis with no new osseous findings. Again noted is a partially visible TIPS stent. Compare: Overall aeration seems unchanged. IMPRESSION: 1. No evidence of acute chest disease. 2. Mild cardiomegaly. 3. Aortic atherosclerosis. 4. TIPS stent. Electronically Signed   By: Francis Quam M.D.   On: 07/01/2024 06:53   DG Abd Portable 1V Result Date: 07/01/2024 EXAM: 1 VIEW XRAY OF THE ABDOMEN 07/01/2024 06:09:00 AM COMPARISON: 1 view abdomen 04/03/24. CLINICAL HISTORY: History of hypertension, colon cancer, cirrhosis of the liver with esophageal varices,  and hepatic encephalopathy. Patient brought in due to elevated ammonia level and mental status change. Family member reports patient was slightly off yesterday, worse this morning, and became much more confused during the day. FINDINGS: BOWEL: Nonobstructive bowel gas pattern. SOFT TISSUES: No abnormal calcifications or opaque urinary calculi. BONES: Mild degenerative changes are present in the left hip. No acute osseous abnormality. VASCULATURE: Portosystemic shunt is in place. IMPRESSION: 1. No acute findings. Electronically signed by: Lonni Necessary MD 07/01/2024 06:51 AM EDT RP Workstation: [id number]     Labs:   Basic Metabolic Panel: Recent Labs  Lab 06/30/24 1145 07/01/24 0330 07/02/24 0445 07/03/24 0442  NA 138 136 139 141  K 4.6 4.3 4.2 3.9  CL 99 101 107 110  CO2 32 22 23 24   GLUCOSE 88 122* 87 68*  BUN 12 12 12 13   CREATININE 1.24* 0.89 1.09* 1.03*  CALCIUM  9.6 9.8 8.8* 8.7*  MG  --   --   --  1.9   GFR Estimated Creatinine Clearance: 45.3 mL/min (A) (by C-G formula based on SCr of 1.03 mg/dL (H)). Liver Function Tests: Recent Labs  Lab 06/30/24 1145 07/01/24 0330 07/02/24 0445 07/03/24 0442  AST 77* 89*  65* 55*  ALT 50* 54* 42 37  ALKPHOS 231* 212* 167* 169*  BILITOT 1.4* 1.8* 1.2 1.2  PROT 6.3 6.8 5.5* 5.2*  ALBUMIN  3.3* 3.1* 2.6* 2.4*   No results for input(s): LIPASE, AMYLASE in the last 168 hours. Recent Labs  Lab 06/30/24 1145 07/01/24 0330 07/02/24 0445 07/03/24 0442  AMMONIA 83* 48* 116* 50*   Coagulation profile Recent Labs  Lab 07/01/24 0330  INR 1.5*    CBC: Recent Labs  Lab 06/30/24 1145 07/01/24 0330 07/02/24 0445 07/03/24 0442  WBC 3.4* 2.4* 2.7* 2.6*  NEUTROABS  --  1.4*  --   --   HGB 11.0* 10.9* 9.5* 8.5*  HCT 32.8* 34.6* 30.3* 27.8*  MCV 99.2 102.1* 105.2* 104.1*  PLT 151.0 139* 126* 114*   Cardiac Enzymes: No results for input(s): CKTOTAL, CKMB, CKMBINDEX, TROPONINI in the last 168  hours. BNP: Invalid input(s): POCBNP CBG: No results for input(s): GLUCAP in the last 168 hours. D-Dimer No results for input(s): DDIMER in the last 72 hours. Hgb A1c No results for input(s): HGBA1C in the last 72 hours. Lipid Profile No results for input(s): CHOL, HDL, LDLCALC, TRIG, CHOLHDL, LDLDIRECT in the last 72 hours. Thyroid  function studies No results for input(s): TSH, T4TOTAL, T3FREE, THYROIDAB in the last 72 hours.  Invalid input(s): FREET3  Anemia work up No results for input(s): VITAMINB12, FOLATE, FERRITIN, TIBC, IRON, RETICCTPCT in the last 72 hours. Microbiology Recent Results (from the past 240 hours)  Culture, blood (Routine X 2) w Reflex to ID Panel     Status: None (Preliminary result)   Collection Time: 07/01/24 10:33 AM   Specimen: BLOOD RIGHT ARM  Result Value Ref Range Status   Specimen Description   Final    BLOOD RIGHT ARM Performed at [facility], [address] [place], [place] [address]    Special Requests   Final    BOTTLES DRAWN AEROBIC ONLY Blood Culture results may not be optimal due to an inadequate volume of blood received in culture bottles Performed at [facility], [address] [place], [place] [address]    Culture   Final    NO GROWTH 2 DAYS Performed at [facility], [address], [place], [place] [address]    Report Status PENDING  Incomplete  Culture, blood (Routine X 2) w Reflex to ID Panel     Status: None (Preliminary result)   Collection Time: 07/01/24 10:33 AM   Specimen: BLOOD RIGHT HAND  Result Value Ref Range Status   Specimen Description   Final    BLOOD RIGHT HAND Performed at [facility], [address] [place], [place] [address]    Special Requests   Final    BOTTLES DRAWN AEROBIC ONLY Blood Culture results may not be optimal due to an inadequate volume of blood received in culture bottles Performed at [facility], [address] [place], [place] [address]    Culture   Final    NO GROWTH 2 DAYS Performed at [facility], [address] [place], [place] [address]    Report Status PENDING  Incomplete     Discharge Instructions:   Discharge Instructions     Ambulatory referral to Physical Therapy   Complete by: As directed    Call MD for:  persistant nausea and vomiting   Complete by: As directed    Call MD for:  severe uncontrolled pain   Complete  by: As directed    Diet - low sodium heart healthy   Complete by: As directed    Discharge instructions   Complete by: As directed    Follow up with your primary care provider in one week. Check blood work at that time. Follow up with GI as outpatient. Keep taking lactulose  to keep bowel movement at least 3 times a day. Seek medical attention for worsening symptoms   Increase activity slowly   Complete by: As directed    No wound care   Complete by: As directed       Allergies as of 07/03/2024       Reactions   Contrast Media [iodinated Contrast Media] Hives        Medication List     TAKE these medications    Accu-Chek Softclix Lancets lancets 3 (three) times daily.   aspirin  EC 81 MG tablet Take 1 tablet (81 mg total) by mouth daily. Swallow whole.   Blood Glucose Monitoring Suppl Devi 1 each by Does not apply route in the morning, at noon, and at bedtime. May substitute to any manufacturer covered by patient's insurance.   CRANBERRY PO Take 1 tablet by mouth 2 (two) times daily.   feeding supplement Liqd Take 237 mLs by mouth 2 (two) times daily between meals.   ferrous sulfate  325 (65 FE) MG tablet Take 1 tablet (325 mg total) by mouth 2 (two) times daily with a meal.   furosemide  20 MG tablet Commonly known as: LASIX  Take 20-40 mg by mouth See admin instructions. Take 40 mg by mouth every morning. Decrease the dose to 20 mg if the blood pressure is low.   hydrocortisone  2.5 % rectal  cream Commonly known as: Anusol -HC Place 1 Application rectally 2 (two) times daily.   lactulose  10 GM/15ML solution Commonly known as: CHRONULAC  Take 45 mLs (30 g total) by mouth 4 (four) times daily.   levOCARNitine  330 MG tablet Commonly known as: CARNITOR  Take 330 mg by mouth See admin instructions. Take 330 mg by mouth with breakfast, between 3-5 PM, and at bedtime   levothyroxine  50 MCG tablet Commonly known as: SYNTHROID  Take 1.5 tablets (75 mcg total) by mouth daily before breakfast.   liver oil-zinc  oxide 40 % ointment Commonly known as: DESITIN Apply topically daily as needed for irritation.   magnesium  oxide 400 MG tablet Commonly known as: MAG-OX Take 1 tablet (400 mg total) by mouth daily. What changed: when to take this   midodrine  10 MG tablet Commonly known as: PROAMATINE  Take 1 tablet (10 mg total) by mouth 3 (three) times daily with meals.   Multi-Vitamin tablet Take 1 tablet by mouth in the morning.   pantoprazole  40 MG tablet Commonly known as: PROTONIX  Take 1 tablet (40 mg total) by mouth 2 (two) times daily. What changed: when to take this   polyethylene glycol 17 g packet Commonly known as: MIRALAX  / GLYCOLAX  Take 17 g by mouth daily.   potassium chloride  SA 20 MEQ tablet Commonly known as: KLOR-CON  M Take 1 tablet (20 mEq total) by mouth daily.   PREPARATION H RE Place 1 Dose rectally as needed (hemorrhoids).   Probiotic Pearls Womens Caps Take 1 capsule by mouth in the morning.   PROTEIN PO Take 1 Bottle by mouth See admin instructions. Fair Life Nutrition Protein Shake- Drink 1 bottle by mouth daily if she can stomach it.   rifaximin  550 MG Tabs tablet Commonly known as: XIFAXAN  Take 1 tablet (550  mg total) by mouth 2 (two) times daily. What changed: when to take this   spironolactone  50 MG tablet Commonly known as: ALDACTONE  Take 1 tablet (50 mg total) by mouth daily.        Follow-up Information     [name], NP  Follow up in 1 week(s).   Specialties: Nurse Practitioner, Family Medicine Contact information: [address] [place] [address] [phone]                  Time coordinating discharge: 39 minutes  Signed:  Merland Holness  [facility] 07/03/2024, 2:40 PM

## 2024-07-04 ENCOUNTER — Telehealth: Payer: Self-pay

## 2024-07-04 NOTE — Transitions of Care (Post Inpatient/ED Visit) (Signed)
 07/04/2024  Name: [name] MRN: [id number] DOB: 26-Jan-1951  Today's TOC FU Call Status: Today's TOC FU Call Status:: Successful TOC FU Call Completed TOC FU Call Complete Date: 07/04/24 Patient's Name and Date of Birth confirmed.  Transition Care Management Follow-up Telephone Call Date of Discharge: 07/03/24 Discharge Facility: [facility]) Type of Discharge: Inpatient Admission Primary Inpatient Discharge Diagnosis:: hepatic encep How have you been since you were released from the hospital?: Better Any questions or concerns?: No  Items Reviewed: Did you receive and understand the discharge instructions provided?: Yes Medications obtained,verified, and reconciled?: Yes (Medications Reviewed) Any new allergies since your discharge?: No Dietary orders reviewed?: Yes Do you have support at home?: Yes People in Home [RPT]: spouse  Medications Reviewed Today: Medications Reviewed Today     Reviewed by Emmitt Pan, LPN (Licensed Practical Nurse) on 07/04/24 at 1101  Med List Status: <None>   Medication Order Taking? Sig Documenting Provider Last Dose Status Informant  Accu-Chek Softclix Lancets lancets [id number] Yes 3 (three) times daily. [provider]  Active Spouse/Significant Other, Pharmacy Records  aspirin  EC 81 MG tablet [id number] Yes Take 1 tablet (81 mg total) by mouth daily. Swallow whole. Bryn Bernardino NOVAK, MD  Active Spouse/Significant Other, Pharmacy Records           Med Note [facility], NATHANEL LOISE [place] Jun 07, 2024  5:47 PM)    Blood Glucose Monitoring Suppl DEVI [id number] Yes 1 each by Does not apply route in the morning, at noon, and at bedtime. May substitute to any manufacturer covered by patient's insurance. Rojelio Nest, DO  Active Spouse/Significant Other, Pharmacy Records  CRANBERRY PO [id number] Yes Take 1 tablet by mouth 2 (two) times daily. [provider]  Active Spouse/Significant Other, Pharmacy Records           Med Note [facility],  NATHANEL LOISE   Wed Jun 07, 2024  5:50 PM)    feeding supplement (ENSURE PLUS HIGH PROTEIN) LIQD [id number] Yes Take 237 mLs by mouth 2 (two) times daily between meals. Darci Pore, MD  Active Spouse/Significant Other, Pharmacy Records  ferrous sulfate  325 [phone] FE) MG tablet [id number] Yes Take 1 tablet (325 mg total) by mouth 2 (two) times daily with a meal. Shahmehdi, Adriana LABOR, MD  Active Spouse/Significant Other, Pharmacy Records  furosemide  (LASIX ) 20 MG tablet [id number] Yes Take 20-40 mg by mouth See admin instructions. Take 40 mg by mouth every morning. Decrease the dose to 20 mg if the blood pressure is low. [provider]  Active Spouse/Significant Other, Pharmacy Records           Med Note MARISA, NATHANEL LOISE   Wed Jun 07, 2024  6:04 PM)    hydrocortisone  (ANUSOL -HC) 2.5 % rectal cream [id number] Yes Place 1 Application rectally 2 (two) times daily. [name], NP  Active Spouse/Significant Other, Pharmacy Records  lactulose  (CHRONULAC ) 10 GM/15ML solution [id number] Yes Take 45 mLs (30 g total) by mouth 4 (four) times daily. Rizwan, Saima, MD  Active Spouse/Significant Other, Pharmacy Records           Med Note EFRAIM ALFREIDA LITTIE [place] Jun 19, 2024 10:32 PM) Patients husband stated they give her 60mls 3 times daily at home because she can't handle qid. This sig is for the hospital.  levOCARNitine  (CARNITOR ) 330 MG tablet [id number] Yes Take 330 mg by mouth See admin instructions. Take 330 mg by mouth with breakfast, between 3-5 PM, and at bedtime  [provider]  Active Spouse/Significant Other, Pharmacy Records           Med Note MARISA, NATHANEL SAILOR   Wed Jun 07, 2024  5:55 PM)    levothyroxine  (SYNTHROID ) 50 MCG tablet [id number] Yes Take 1.5 tablets (75 mcg total) by mouth daily before breakfast. Earley Saucer, MD  Active Spouse/Significant Other, Pharmacy Records           Med Note EFRAIM ALFREIDA LITTIE [place] Jun 19, 2024 10:32 PM) Patients husband stated the patient  needs to be on brand synthroid  not the generic.  liver oil-zinc  oxide (DESITIN) 40 % ointment [id number] Yes Apply topically daily as needed for irritation. Pokhrel, Laxman, MD  Active Spouse/Significant Other, Pharmacy Records  magnesium  oxide (MAG-OX) 400 MG tablet [id number] Yes Take 1 tablet (400 mg total) by mouth daily.  Patient taking differently: Take 400 mg by mouth in the morning.   Rojelio Nest, DO  Active Spouse/Significant Other, Pharmacy Records           Med Note [place], NATHANEL SAILOR   Wed Jun 07, 2024  5:55 PM)    midodrine  (PROAMATINE ) 10 MG tablet [id number] Yes Take 1 tablet (10 mg total) by mouth 3 (three) times daily with meals. Darci Pore, MD  Active Spouse/Significant Other, Pharmacy Records  Multiple Vitamin (MULTI-VITAMIN) tablet [id number] Yes Take 1 tablet by mouth in the morning. [provider]  Active Spouse/Significant Other, Pharmacy Records           Med Note MARISA, NATHANEL SAILOR   Wed Jun 07, 2024  5:53 PM)    pantoprazole  (PROTONIX ) 40 MG tablet [id number] Yes Take 1 tablet (40 mg total) by mouth 2 (two) times daily.  Patient taking differently: Take 40 mg by mouth 2 (two) times daily before a meal.   Shahmehdi, Adriana LABOR, MD  Active Spouse/Significant Other, Pharmacy Records  Phenylephrine  HCl (PREPARATION H RE) [id number] Yes Place 1 Dose rectally as needed (hemorrhoids). [provider]  Active Spouse/Significant Other, Pharmacy Records  polyethylene glycol (MIRALAX  / GLYCOLAX ) 17 g packet [id number] Yes Take 17 g by mouth daily. Rizwan, Saima, MD  Active Spouse/Significant Other, Pharmacy Records  potassium chloride  SA (KLOR-CON  M) 20 MEQ tablet [id number] Yes Take 1 tablet (20 mEq total) by mouth daily. Darci Pore, MD  Active Spouse/Significant Other, Pharmacy Records  Probiotic Product (PROBIOTIC PEARLS WOMENS) CAPS [id number] Yes Take 1 capsule by mouth in the morning. [provider]  Active Spouse/Significant Other, Pharmacy  Records  PROTEIN PO [id number] Yes Take 1 Bottle by mouth See admin instructions. Fair Life Nutrition Protein Shake- Drink 1 bottle by mouth daily if she can stomach it. [provider]  Active Spouse/Significant Other, Pharmacy Records  rifaximin  (XIFAXAN ) 550 MG TABS tablet [id number] Yes Take 1 tablet (550 mg total) by mouth 2 (two) times daily.  Patient taking differently: Take 550 mg by mouth in the morning and at bedtime.   Bryn Bernardino NOVAK, MD  Active Spouse/Significant Other, Pharmacy Records           Med Note [facility], NATHANEL SAILOR   Wed Jun 07, 2024  5:53 PM)    spironolactone  (ALDACTONE ) 50 MG tablet [id number] Yes Take 1 tablet (50 mg total) by mouth daily. Darci Pore, MD  Active Spouse/Significant Other, Pharmacy Records            Home Care and Equipment/Supplies: Were Home Health Services Ordered?: NA Any new equipment or medical supplies ordered?: Yes Name of Medical supply  agency?: unknown Were you able to get the equipment/medical supplies?: Yes Do you have any questions related to the use of the equipment/supplies?: No  Functional Questionnaire: Do you need assistance with bathing/showering or dressing?: Yes Do you need assistance with meal preparation?: Yes Do you need assistance with eating?: No Do you have difficulty maintaining continence: Yes Do you need assistance with getting out of bed/getting out of a chair/moving?: No Do you have difficulty managing or taking your medications?: Yes  Follow up appointments reviewed: PCP Follow-up appointment confirmed?: Yes Date of PCP follow-up appointment?: 07/05/24 Follow-up Provider: [facility] Follow-up appointment confirmed?: Yes Date of Specialist follow-up appointment?: 07/31/24 Follow-Up Specialty Provider:: GI Do you need transportation to your follow-up appointment?: No Do you understand care options if your condition(s) worsen?: Yes-patient verbalized understanding    SIGNATURE  Julian Lemmings, LPN [facility] Nurse Health Advisor Direct Dial [phone]

## 2024-07-05 ENCOUNTER — Other Ambulatory Visit

## 2024-07-05 ENCOUNTER — Ambulatory Visit (INDEPENDENT_AMBULATORY_CARE_PROVIDER_SITE_OTHER): Admitting: Nurse Practitioner

## 2024-07-05 ENCOUNTER — Telehealth: Payer: Self-pay

## 2024-07-05 VITALS — BP 108/62 | HR 87 | Temp 97.2°F | Ht 63.0 in | Wt 146.6 lb

## 2024-07-05 DIAGNOSIS — Z09 Encounter for follow-up examination after completed treatment for conditions other than malignant neoplasm: Secondary | ICD-10-CM | POA: Diagnosis not present

## 2024-07-05 DIAGNOSIS — E039 Hypothyroidism, unspecified: Secondary | ICD-10-CM | POA: Diagnosis not present

## 2024-07-05 DIAGNOSIS — K7469 Other cirrhosis of liver: Secondary | ICD-10-CM | POA: Diagnosis not present

## 2024-07-05 LAB — COMPREHENSIVE METABOLIC PANEL WITH GFR
ALT: 39 U/L — ABNORMAL HIGH (ref 0–35)
AST: 65 U/L — ABNORMAL HIGH (ref 0–37)
Albumin: 3.1 g/dL — ABNORMAL LOW (ref 3.5–5.2)
Alkaline Phosphatase: 186 U/L — ABNORMAL HIGH (ref 39–117)
BUN: 16 mg/dL (ref 6–23)
CO2: 31 meq/L (ref 19–32)
Calcium: 9.1 mg/dL (ref 8.4–10.5)
Chloride: 106 meq/L (ref 96–112)
Creatinine, Ser: 1.26 mg/dL — ABNORMAL HIGH (ref 0.40–1.20)
GFR: 42.47 mL/min — ABNORMAL LOW (ref >60.00)
Glucose, Bld: 123 mg/dL — ABNORMAL HIGH (ref 70–99)
Potassium: 3.4 meq/L — ABNORMAL LOW (ref 3.5–5.1)
Sodium: 142 meq/L (ref 135–145)
Total Bilirubin: 1.2 mg/dL (ref 0.2–1.2)
Total Protein: 6.4 g/dL (ref 6.0–8.3)

## 2024-07-05 LAB — TSH: TSH: 1.94 m[IU]/L (ref 0.40–4.50)

## 2024-07-05 LAB — CBC
HCT: 31.9 % — ABNORMAL LOW (ref 36.0–46.0)
Hemoglobin: 10.7 g/dL — ABNORMAL LOW (ref 12.0–15.0)
MCHC: 33.4 g/dL (ref 30.0–36.0)
MCV: 100.4 fl — ABNORMAL HIGH (ref 78.0–100.0)
Platelets: 164 K/uL (ref 150.0–400.0)
RBC: 3.18 Mil/uL — ABNORMAL LOW (ref 3.87–5.11)
RDW: 23.3 % — ABNORMAL HIGH (ref 11.5–15.5)
WBC: 2.5 K/uL — ABNORMAL LOW (ref 4.0–10.5)

## 2024-07-05 NOTE — Progress Notes (Signed)
 Acute Office Visit  Subjective:     Patient ID: [name], female    DOB: 15-Jun-1951, 74 y.o.   MRN: [id number]  Chief Complaint  Patient presents with   Follow-up    Pt complains of feeling better since last visit. Still a little shaky but doing well. Pt is concerned about heart rate.     HPI Patient is in today for hospital follow-up  Patient was seen by me on 06/30/2024.  Benadryl  labs with an elevated ammonia and decreased renal function.  Had discussion with patient and spouse about going back to the hospital or trying to manage her weekend with repeat labs.  They agreed to try to manage over the weekend and repeat labs on Monday.  Patient went to the hospital on 07/01/24.  They did redraw labs that showed an elevated ammonia.  Patient was discharged on 07/03/2024.  They recommend lactulose  titration for 3-4 bowel movements a day.  She was given lactulose  enema and p.o. lactulose  which she was responsive to.  Patient was also hypothermic.  She is here for follow-up today  Staste that she is having 4+ BM a day. State that she had 7 yesterday. Has had 2 BMs today  He is doing 3 table spoons four times a day of lactulose   She is eating well. States that yestderay was 4 meals that was smaller. States that she is doing well with her water intake  Review of Systems  Constitutional:  Negative for chills and fever.  Respiratory:  Negative for shortness of breath.   Cardiovascular:  Negative for chest pain.  Gastrointestinal:  Negative for abdominal pain, constipation, diarrhea, nausea and vomiting.        Objective:    BP 108/62   Pulse 87   Temp (!) 97.2 F (36.2 C) (Oral)   Ht 5' 3 (1.6 m)   Wt 146 lb 9.6 oz (66.5 kg)   SpO2 97%   BMI 25.97 kg/m  BP Readings from Last 3 Encounters:  07/05/24 108/62  07/03/24 132/74  06/30/24 116/72   Wt Readings from Last 3 Encounters:  07/05/24 146 lb 9.6 oz (66.5 kg)  07/01/24 152 lb 1.9 oz (69 kg)  06/30/24 152 lb 3.2 oz (69  kg)   SpO2 Readings from Last 3 Encounters:  07/05/24 97%  07/03/24 96%  06/30/24 97%      Physical Exam Vitals and nursing note reviewed.  Constitutional:      Appearance: Normal appearance.  Cardiovascular:     Rate and Rhythm: Normal rate and regular rhythm.     Heart sounds: Normal heart sounds.  Pulmonary:     Effort: Pulmonary effort is normal.     Breath sounds: Normal breath sounds.  Neurological:     Mental Status: She is alert.     No results found for any visits on 07/05/24.      Assessment & Plan:   Problem List Items Addressed This Visit       Digestive   Cirrhosis (HCC) - Primary   Patient assigned followed by GI and liver clinic.  Patient on Xifaxan  and lactulose .  Continue medication as prescribed      Relevant Orders   CBC   Magnesium    Comprehensive metabolic panel with GFR     Other   Hospital discharge follow-up   Did review ED note and inpatient discharge note.  Did review basic labs      Relevant Orders   CBC  Magnesium    Comprehensive metabolic panel with GFR    No orders of the defined types were placed in this encounter.   Return in about 4 weeks (around 08/02/2024) for Liver/mentation .  [name], NP

## 2024-07-05 NOTE — Assessment & Plan Note (Signed)
 Patient assigned followed by GI and liver clinic.  Patient on Xifaxan  and lactulose .  Continue medication as prescribed

## 2024-07-05 NOTE — Telephone Encounter (Signed)
 Copied from CRM [id number]. Topic: Clinical - Medical Advice >> Jul 05, 2024 11:12 AM Chasity T wrote: Reason for CRM: Sreejesh from [facility] is calling to inform that the patient is doing outpatient therapy and does not need home health anymore.

## 2024-07-05 NOTE — Patient Instructions (Signed)
 Nice to see you today Follow up with me in 1 month, sooner if you need me

## 2024-07-05 NOTE — Assessment & Plan Note (Signed)
 Did review ED note and inpatient discharge note.  Did review basic labs

## 2024-07-06 LAB — CULTURE, BLOOD (ROUTINE X 2)
Culture: NO GROWTH
Culture: NO GROWTH

## 2024-07-06 LAB — MAGNESIUM: Magnesium: 1.5 mg/dL (ref 1.5–2.5)

## 2024-07-07 ENCOUNTER — Ambulatory Visit: Payer: Self-pay | Admitting: Nurse Practitioner

## 2024-07-12 ENCOUNTER — Encounter: Payer: Self-pay | Admitting: "Endocrinology

## 2024-07-12 ENCOUNTER — Ambulatory Visit (INDEPENDENT_AMBULATORY_CARE_PROVIDER_SITE_OTHER): Admitting: "Endocrinology

## 2024-07-12 VITALS — BP 106/80 | HR 64 | Ht 63.0 in | Wt 152.0 lb

## 2024-07-12 DIAGNOSIS — E039 Hypothyroidism, unspecified: Secondary | ICD-10-CM

## 2024-07-12 NOTE — Progress Notes (Signed)
 Outpatient Endocrinology Note Katherine Birmingham, MD    [name] Apr 06, 1951 [id number]  Referring Provider: [name], NP Primary Care Provider: [name], NP Reason for consultation: Subjective   Assessment & Plan  Diagnoses and all orders for this visit:  Acquired hypothyroidism  Patient had a low evening cortisol around 3, followed by a stim test which was short of 0.2, with final result and cortisol of 17.8 starting from a baseline of 4.2.  In cases where cortisol does not rise above 18, we need to know the if the assay used to measure cortisol a more specific assay, for example, immunoassay using a more specific monoclonal antibody to cortisol?  If yes adrenal insufficiency is unlikely.  Since the assay type is unknown, we have to obtain age- and sex- specific DHEA-sulfate. If it is low, adrenal insufficiency is likely.  However in this case, given the cortisol level rose almost to 18, the adrenal insufficiency seems to be unlikely. DHEA-S WNL as well. Currently off of hydrocortisone  and feel well 09/2023 Off of HC cortisol is 7.8 with normal DHEAS 03/06/24: re-ordered ACTH  stimulation test: result WNL  Been on levothyroxine  since around 2020 On synthroid  75 mcg po every day, continue current dose Recommend to take it first thing in the morning on empty stomach and wait at least 30 minutes to 1 hour before eating or drinking anything or taking any other medications. Space out levothyroxine  by 4 hours from any acid reflux medication, fibrate, iron, calcium , multivitamin and nutritional supplements.  Lab WNL, feels well Continue with PCP  No follow-ups on file.    I have reviewed current medications, nurse's notes, allergies, vital signs, past medical and surgical history, family medical history, and social history for this encounter. Counseled patient on symptoms, examination findings, lab findings, imaging results, treatment decisions and monitoring and  prognosis. The patient understood the recommendations and agrees with the treatment plan. All questions regarding treatment plan were fully answered.  Katherine Birmingham, MD  07/12/24   History of Present Illness HPI  Pt is here for adrenal insufficiency work up, also wants advise on hypothyroidism  Weaned completely off of hydrocortisone  On synthroid  75 mcg qam Feels more energetic No dysphagia/dysphonia/dyspnea   Been on levothyroxine  since around 2020, not sure if on 25 mcg or 50 mcg po every day, takes vitamin close after    Ref Range & Units (hover) 4 mo ago  TIME 1 324  Coritsol 15.4  Time 2 403  Cortisol 27.5  TIME 3 433  Cortisol 32.0    Initial work up:  Was admitted in hospital for hepatic encephalopathy/hyperammonemia in 02/2023 and was found to have low cortisol of 2.9 around 6 pm on 03/11/23.   She is taking hydrocortisone  5 mg tid since 02/2023  Currently taking hydrocortisone  10 mg qam and 5 mg at 3 pm   Patient feels well otherwise, has noted no chage   In 03/2023 admitted again for infection  After holding hydrocortisone  5 mg dose in evening Component     Latest Ref Rng 05/11/2023  Cortisol, Plasma     ug/dL 5.0      ACTH  Stimulation test  Component     Latest Ref Rng 03/12/2023  Cortisol, Base     ug/dL 4.2   Cortisol, 30 Min     ug/dL 85.4   Cortisol, 60 Min     ug/dL 82.1      Component Ref Range & Units 05/05/23 2  wk ago 2 mo ago 2 yr ago  Cortisol, Plasma ug/dL 86.0 78.9 CM 2.9 CM 9.7 CM  Comment: AM:  4.3 - 22.4 ug/dLPM:  3.1 - 16.7 ug/dL   While on HC 5 mg tid:  Component     Latest Ref Rng 05/05/2023  Sodium     135 - 145 mEq/L 139   Potassium     3.5 - 5.1 mEq/L 3.9   Chloride     96 - 112 mEq/L 102   CO2     19 - 32 mEq/L 28   Glucose     70 - 99 mg/dL 88   BUN     6 - 23 mg/dL 13   Creatinine     9.59 - 1.20 mg/dL 9.36   GFR     >39.99 mL/min 88.93   Calcium      8.4 - 10.5 mg/dL 9.1   DHEA-SO4     4 - 157 mcg/dL 4    ALDOSTERONE      ng/dL 2   Cortisol, Plasma     ug/dL 86.0     Physical Exam  BP 106/80   Pulse 64   Ht 5' 3 (1.6 m)   Wt 152 lb (68.9 kg)   SpO2 97%   BMI 26.93 kg/m    Constitutional: well developed, well nourished Head: normocephalic, atraumatic Eyes: sclera anicteric, no redness Neck: supple Lungs: normal respiratory effort Neurology: alert and oriented Skin: dry, no appreciable rashes Musculoskeletal: no appreciable defects Psychiatric: normal mood and affect   Current Medications Patient's Medications  New Prescriptions   No medications on file  Previous Medications   ACCU-CHEK SOFTCLIX LANCETS LANCETS    3 (three) times daily.   ASPIRIN  EC 81 MG TABLET    Take 1 tablet (81 mg total) by mouth daily. Swallow whole.   BLOOD GLUCOSE MONITORING SUPPL DEVI    1 each by Does not apply route in the morning, at noon, and at bedtime. May substitute to any manufacturer covered by patient's insurance.   CRANBERRY PO    Take 1 tablet by mouth 2 (two) times daily.   FEEDING SUPPLEMENT (ENSURE PLUS HIGH PROTEIN) LIQD    Take 237 mLs by mouth 2 (two) times daily between meals.   FERROUS SULFATE  325 (65 FE) MG TABLET    Take 1 tablet (325 mg total) by mouth 2 (two) times daily with a meal.   FUROSEMIDE  (LASIX ) 20 MG TABLET    Take 20-40 mg by mouth See admin instructions. Take 40 mg by mouth every morning. Decrease the dose to 20 mg if the blood pressure is low.   HYDROCORTISONE  (ANUSOL -HC) 2.5 % RECTAL CREAM    Place 1 Application rectally 2 (two) times daily.   LACTULOSE  (CHRONULAC ) 10 GM/15ML SOLUTION    Take 45 mLs (30 g total) by mouth 4 (four) times daily.   LEVOCARNITINE  (CARNITOR ) 330 MG TABLET    Take 330 mg by mouth See admin instructions. Take 330 mg by mouth with breakfast, between 3-5 PM, and at bedtime   LEVOTHYROXINE  (SYNTHROID ) 50 MCG TABLET    Take 1.5 tablets (75 mcg total) by mouth daily before breakfast.   LIVER OIL-ZINC  OXIDE (DESITIN) 40 % OINTMENT    Apply  topically daily as needed for irritation.   MAGNESIUM  OXIDE (MAG-OX) 400 MG TABLET    Take 1 tablet (400 mg total) by mouth daily.   MIDODRINE  (PROAMATINE ) 10 MG TABLET    Take 1 tablet (10  mg total) by mouth 3 (three) times daily with meals.   MULTIPLE VITAMIN (MULTI-VITAMIN) TABLET    Take 1 tablet by mouth in the morning.   PANTOPRAZOLE  (PROTONIX ) 40 MG TABLET    Take 1 tablet (40 mg total) by mouth 2 (two) times daily.   PHENYLEPHRINE  HCL (PREPARATION H RE)    Place 1 Dose rectally as needed (hemorrhoids).   POLYETHYLENE GLYCOL (MIRALAX  / GLYCOLAX ) 17 G PACKET    Take 17 g by mouth daily.   POTASSIUM CHLORIDE  SA (KLOR-CON  M) 20 MEQ TABLET    Take 1 tablet (20 mEq total) by mouth daily.   PROBIOTIC PRODUCT (PROBIOTIC PEARLS WOMENS) CAPS    Take 1 capsule by mouth in the morning.   PROTEIN PO    Take 1 Bottle by mouth See admin instructions. Fair Life Nutrition Protein Shake- Drink 1 bottle by mouth daily if she can stomach it.   RIFAXIMIN  (XIFAXAN ) 550 MG TABS TABLET    Take 1 tablet (550 mg total) by mouth 2 (two) times daily.   SPIRONOLACTONE  (ALDACTONE ) 50 MG TABLET    Take 1 tablet (50 mg total) by mouth daily.  Modified Medications   No medications on file  Discontinued Medications   No medications on file    Allergies Allergies  Allergen Reactions   Contrast Media [Iodinated Contrast Media] Hives    Past Medical History Past Medical History:  Diagnosis Date   Acute hepatic encephalopathy (HCC) 03/30/2024   Acute urinary retention 05/04/2022   Allergy 2006 ?   Contrast dye   Arthritis 2016 ??   Knees and thumb   Cancer [facility])    cecum   Cataract 2021   Surgery scheduled July 2023   Colon cancer [place]) 2003   Elevated liver function tests    Esophageal varices (HCC)    Heart murmur On file   Hemorrhage of gastrointestinal tract 05/04/2011   Hypertension 2021   Hypothyroidism    Iron deficiency anemia    Liver disease    chemotherapy complication, per pt, shunts  placed to bypass liver   Malignant neoplasm of cecum (HCC)    Portal hypertension (HCC)    Skin cancer 2019   Splenomegaly     Past Surgical History Past Surgical History:  Procedure Laterality Date   COLON SURGERY  2004   Cancer   COSMETIC SURGERY  2021   Skin cancer   ESOPHAGEAL VARICE LIGATION     ESOPHAGOGASTRODUODENOSCOPY N/A 05/28/2024   Procedure: EGD (ESOPHAGOGASTRODUODENOSCOPY);  Surgeon: Albertus Gordy HERO, MD;  Location: [facility];  Service: Gastroenterology;  Laterality: N/A;   EYE SURGERY     HEMICOLECTOMY  01/08/2003   IR RADIOLOGIST EVAL & MGMT  12/20/2020   IR RADIOLOGIST EVAL & MGMT  05/29/2021   LIVER SURGERY     shunts placed after chemo complication   SKIN FULL THICKNESS GRAFT N/A 09/12/2019   Procedure: debridement and FTSG to the nose from left upper arm;  Surgeon: Elisabeth Craig RAMAN, MD;  Location: [facility];  Service: Plastics;  Laterality: N/A;  2 hours, please   TIPS PROCEDURE      Family History family history includes Arthritis in her father and mother; Cancer in her maternal aunt; Diabetes in her father; Hearing loss in her mother; Heart disease in her father and mother; Hypertension in her mother; Miscarriages / [place] in her mother.  Social History Social History   Socioeconomic History   Marital status: Married    Spouse  name: Valere   Number of children: 2   Years of education: Not on file   Highest education level: Some college, no degree  Occupational History   Occupation: Librarian  Tobacco Use   Smoking status: Never   Smokeless tobacco: Never   Tobacco comments:    Never smoked  Vaping Use   Vaping status: Never Used  Substance and Sexual Activity   Alcohol  use: Never   Drug use: Never   Sexual activity: Not Currently    Partners: Male  Other Topics Concern   Not on file  Social History Narrative   12/04/20   From: the area   Living: with husband, Valere (1994)   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       Family: 2 children Designer, fashion/clothing and Optometrist - 2 grandchildren - nearby      Enjoys: read      Exercise: trying to get back to exercise   Diet: healthy, limits fast foods      Safety   Seat belts: Yes    Guns: Yes  and secure   Safe in relationships: Yes    Social Drivers of Corporate investment banker Strain: Low Risk  (06/15/2024)   Overall Financial Resource Strain (CARDIA)    Difficulty of Paying Living Expenses: Not hard at all  Food Insecurity: No Food Insecurity (07/01/2024)   Hunger Vital Sign    Worried About Running Out of Food in the Last Year: Never true    Ran Out of Food in the Last Year: Never true  Transportation Needs: No Transportation Needs (07/01/2024)   PRAPARE - Administrator, Civil Service (Medical): No    Lack of Transportation (Non-Medical): No  Physical Activity: Insufficiently Active (05/10/2024)   Exercise Vital Sign    Days of Exercise per Week: 2 days    Minutes of Exercise per Session: 20 min  Stress: No Stress Concern Present (05/10/2024)   [employer] of Occupational Health - Occupational Stress Questionnaire    Feeling of Stress: Only a little  Social Connections: Socially Integrated (07/01/2024)   Social Connection and Isolation Panel    Frequency of Communication with Friends and Family: More than three times a week    Frequency of Social Gatherings with Friends and Family: Three times a week    Attends Religious Services: More than 4 times per year    Active Member of Clubs or Organizations: Yes    Attends Banker Meetings: More than 4 times per year    Marital Status: Married  Catering manager Violence: Not At Risk (07/01/2024)   Humiliation, Afraid, Rape, and Kick questionnaire    Fear of Current or Ex-Partner: No    Emotionally Abused: No    Physically Abused: No    Sexually Abused: No    Lab Results  Component Value Date   CHOL 117 04/16/2024   Lab Results  Component Value Date   HDL 43 04/16/2024    Lab Results  Component Value Date   LDLCALC 69 04/16/2024   Lab Results  Component Value Date   TRIG 26 04/16/2024   Lab Results  Component Value Date   CHOLHDL 2.7 04/16/2024   Lab Results  Component Value Date   CREATININE 1.26 (H) 07/05/2024   Lab Results  Component Value Date   GFR 42.47 (L) 07/05/2024      Component Value Date/Time   NA 142 07/05/2024 1121   NA 143 03/19/2023 1532  NA 143 01/08/2015 1547   K 3.4 (L) 07/05/2024 1121   K 4.1 01/08/2015 1547   CL 106 07/05/2024 1121   CL 109 (H) 11/28/2012 1555   CO2 31 07/05/2024 1121   CO2 25 01/08/2015 1547   GLUCOSE 123 (H) 07/05/2024 1121   GLUCOSE 81 01/08/2015 1547   GLUCOSE 72 11/28/2012 1555   BUN 16 07/05/2024 1121   BUN 14 03/19/2023 1532   BUN 11.6 01/08/2015 1547   CREATININE 1.26 (H) 07/05/2024 1121   CREATININE 1.12 (H) 12/10/2023 1558   CREATININE 0.8 01/08/2015 1547   CALCIUM  9.1 07/05/2024 1121   CALCIUM  8.8 01/08/2015 1547   PROT 6.4 07/05/2024 1121   PROT 6.5 03/19/2023 1532   PROT 7.0 01/08/2015 1547   ALBUMIN  3.1 (L) 07/05/2024 1121   ALBUMIN  3.9 03/19/2023 1532   ALBUMIN  3.5 01/08/2015 1547   AST 65 (H) 07/05/2024 1121   AST 43 (H) 01/08/2015 1547   ALT 39 (H) 07/05/2024 1121   ALT 26 01/08/2015 1547   ALKPHOS 186 (H) 07/05/2024 1121   ALKPHOS 98 01/08/2015 1547   BILITOT 1.2 07/05/2024 1121   BILITOT 1.1 03/19/2023 1532   BILITOT 0.76 01/08/2015 1547   GFRNONAA 57 (L) 07/03/2024 0442   GFRAA >60 08/17/2020 0135      Latest Ref Rng & Units 07/05/2024   11:21 AM 07/03/2024    4:42 AM 07/02/2024    4:45 AM  BMP  Glucose 70 - 99 mg/dL 876  68  87   BUN 6 - 23 mg/dL 16  13  12    Creatinine 0.40 - 1.20 mg/dL 8.73  8.96  8.90   Sodium 135 - 145 mEq/L 142  141  139   Potassium 3.5 - 5.1 mEq/L 3.4  3.9  4.2   Chloride 96 - 112 mEq/L 106  110  107   CO2 19 - 32 mEq/L 31  24  23    Calcium  8.4 - 10.5 mg/dL 9.1  8.7  8.8        Component Value Date/Time   WBC 2.5 (L)  07/05/2024 1121   RBC 3.18 (L) 07/05/2024 1121   HGB 10.7 (L) 07/05/2024 1121   HGB 11.1 04/02/2023 1205   HGB 12.7 01/08/2015 1546   HCT 31.9 (L) 07/05/2024 1121   HCT 32.3 (L) 04/02/2023 1205   HCT 39.7 01/08/2015 1546   PLT 164.0 07/05/2024 1121   PLT 98 (LL) 04/02/2023 1205   MCV 100.4 (H) 07/05/2024 1121   MCV 99 (H) 04/02/2023 1205   MCV 97.8 01/08/2015 1546   MCH 31.8 07/03/2024 0442   MCHC 33.4 07/05/2024 1121   RDW 23.3 (H) 07/05/2024 1121   RDW 15.8 (H) 04/02/2023 1205   RDW 13.4 01/08/2015 1546   LYMPHSABS 0.7 07/01/2024 0330   LYMPHSABS 1.2 01/08/2015 1546   MONOABS 0.3 07/01/2024 0330   MONOABS 0.3 01/08/2015 1546   EOSABS 0.1 07/01/2024 0330   EOSABS 0.2 01/08/2015 1546   BASOSABS 0.0 07/01/2024 0330   BASOSABS 0.0 01/08/2015 1546   Lab Results  Component Value Date   TSH 1.94 07/05/2024   TSH 6.61 (H) 06/30/2024   TSH 12.833 (H) 06/19/2024   FREET4 1.11 06/30/2024   FREET4 0.75 06/20/2024   FREET4 0.79 06/07/2024         Parts of this note may have been dictated using voice recognition software. There may be variances in spelling and vocabulary which are unintentional. Not all errors are proofread. Please notify the Chartered loss adjuster  if any discrepancies are noted or if the meaning of any statement is not clear.

## 2024-07-19 ENCOUNTER — Telehealth: Payer: Self-pay

## 2024-07-19 ENCOUNTER — Ambulatory Visit: Payer: Self-pay | Admitting: *Deleted

## 2024-07-19 NOTE — Telephone Encounter (Signed)
 It sounds like in order to keep blood pressure high enough to tolerate the 2 diuretics for swelling, then minodrine would be a good idea  Do you have any at home currently ?

## 2024-07-19 NOTE — Telephone Encounter (Signed)
 Copied from CRM #[id number]. Topic: Clinical - Medical Advice >> Jul 18, 2024 11:11 AM Thersia BROCKS wrote: Reason for CRM: Patenet husband,[name] called in stated she does  spironolactone  (ALDACTONE ) 50 MG tablet furosemide  (LASIX ) 20 MG table  Stop the midodrine  (PROAMATINE ) 10 MG tablet the last time she was in the hopsital   wanted to know what she should be taking , and doing    Husband would like a callback  [phone]

## 2024-07-19 NOTE — Telephone Encounter (Signed)
 Copied from CRM [id number]. Topic: Clinical - Red Word Triage >> Jul 19, 2024  3:39 PM Berneda FALCON wrote: Red Word that prompted transfer to Nurse Triage: Husband Valere states that this patient has very low blood pressure and is on 2 different blood pressure medication so when it gets low, he cannot give both the lasix  pills, but this causes swelling in the legs when she does not get both lasix  pills.  These are her lasix  meds-spironolactone  (ALDACTONE ) 50 MG tablet and furosemide  (LASIX ) 20 MG tablet  Pt is currently swelling in both legs and blood pressure is somewhere around 90's/40's (according to spouse) Reason for Disposition  [1] MODERATE leg swelling (e.g., swelling extends up to knees) AND [2] new-onset or getting worse    Weight is up also   Husband calling in for advice.   Requesting a call back.   See triage notes for details.  Answer Assessment - Initial Assessment Questions 1. ONSET: When did the swelling start? (e.g., minutes, hours, days)     [name] husband calling in. Questions about swelling in legs and Lasix .   He called yesterday 8/26 and no one ever called him back.   He knows Lynwood Crandall, NP is in [place] but was wondering if someone else was available to assist him..  She takes spironolactone  in the morning and Lasix  depending on her BP.   If BP is low he gives her 20 mg if not gives her 40 mg   Her BP has been 90/40s the last 2 days.   That's low for her.   Normally 107/45-65 about this range. Lasix  given if BP over 150 gives 2 pills.   If less than 150 only 1 pill given of the Lasix .   She was in the hospital 2 hospital stays ago she was given Midodrine .    The last hospitalization they stopped it.   She has been 108/55, 107/53, etc.so it was stopped. Yesterday 94/46   Today 94/49.    I called yesterday because 94/46 scarred me.   I never heard back.  So I did not give her any Lasix  yesterday.   I gave her one Lasix  today.  Should she be back on the Midodrine ?  She always  weighs every morning.   She is increasing in her weight.   150 now 155.6 lbs.today.   The BP is so low I don't want to give her the extra Lasix .  But both of her legs are swollen.    Should she be on Midodrine  again? Both legs are swelling.   They are starting to hurt due to the swelling.    2. LOCATION: What part of the leg is swollen?  Are both legs swollen or just one leg?     Both legs 3. SEVERITY: How bad is the swelling? (e.g., localized; mild, moderate, severe)     Her legs are starting to hurt today due to the swelling 4. REDNESS: Is there redness or signs of infection?     No 5. PAIN: Is the swelling painful to touch? If Yes, ask: How painful is it?   (Scale 1-10; mild, moderate or severe)     Yes today there is 6. FEVER: Do you have a fever? If Yes, ask: What is it, how was it measured, and when did it start?      Not asked 7. CAUSE: What do you think is causing the leg swelling?     She is probably needing more Lasix  but  her BP is so low I'm afraid to give her 2 pills of it. 8. MEDICAL HISTORY: Do you have a history of blood clots (e.g., DVT), cancer, heart failure, kidney disease, or liver failure?     Not asked 9. RECURRENT SYMPTOM: Have you had leg swelling before? If Yes, ask: When was the last time? What happened that time?     Yes 10. OTHER SYMPTOMS: Do you have any other symptoms? (e.g., chest pain, difficulty breathing)       No 11. PREGNANCY: Is there any chance you are pregnant? When was your last menstrual period?       N/A due to age  Protocols used: Leg Swelling and Edema-A-AH Please call husband, Valere today at [phone].   Seeking advice on Lasix  dosage.   See triage notes for full details.  He called yesterday but no one called him back.       FYI Only or Action Required?: Action required by provider: clinical question for provider.  Patient was last seen in primary care on 07/05/2024 by [name], NP.  Called Nurse  Triage reporting Leg Swelling. Both legs are swelling and her weight is increasing.   Husband seeking advice on Lasix  dosage because her BP is running low so afraid to give her 2 Lasix  pills.   See triage notes for full details.   Symptoms began several days ago. This week  Interventions attempted: Prescription medications: Lasix    .  Symptoms are: gradually worsening.Weight is increasing and legs are swollen but BP running lower than normal so afraid to give her 2 Lasix  pills.   Should she be back on the Midodrine ?    Please call [name] at [phone].  Triage Disposition: Call PCP Now  Patient/caregiver understands and will follow disposition?: Yes

## 2024-07-20 NOTE — Telephone Encounter (Signed)
 Left VM letting pt/spouse know Dr. Graham comments

## 2024-07-20 NOTE — Telephone Encounter (Signed)
 Spoke with spouse and pt and he said that he has been taking pt's BP 1st thing in the morning so he isn't sure if the prev blood pressure reading are just low because he didn't give pt time to get up and move around. Pt said this morning it was better. Her 1st BP when she got was up 112/61. Pt's spouse then gave her 2 lasix  and he just rechecked her BP while on the phone with me and it was 115/52. Spouse wants to know why her BP bottomed out like that and now it's back in normal range. He also wants to know given that her BP is stable today if he should still give her the midodrine . They do have a whole bottle of midodrine  at home but he didn't want to just start trying to dose that medication himself without checking with her provider.

## 2024-07-20 NOTE — Telephone Encounter (Signed)
 If blood pressure is back in the normal range-do not add minodrine back Continue to monitor and keep us  posted Thanks

## 2024-07-31 ENCOUNTER — Other Ambulatory Visit (INDEPENDENT_AMBULATORY_CARE_PROVIDER_SITE_OTHER)

## 2024-07-31 ENCOUNTER — Ambulatory Visit: Admitting: Nurse Practitioner

## 2024-07-31 ENCOUNTER — Encounter: Payer: Self-pay | Admitting: Nurse Practitioner

## 2024-07-31 ENCOUNTER — Ambulatory Visit: Admitting: Neurology

## 2024-07-31 VITALS — BP 90/58 | Temp 78.0°F | Ht 64.0 in | Wt 156.4 lb

## 2024-07-31 VITALS — BP 112/66 | HR 88 | Ht 64.0 in | Wt 156.0 lb

## 2024-07-31 DIAGNOSIS — D539 Nutritional anemia, unspecified: Secondary | ICD-10-CM

## 2024-07-31 DIAGNOSIS — I639 Cerebral infarction, unspecified: Secondary | ICD-10-CM | POA: Diagnosis not present

## 2024-07-31 DIAGNOSIS — K253 Acute gastric ulcer without hemorrhage or perforation: Secondary | ICD-10-CM

## 2024-07-31 DIAGNOSIS — Z85038 Personal history of other malignant neoplasm of large intestine: Secondary | ICD-10-CM

## 2024-07-31 DIAGNOSIS — K7460 Unspecified cirrhosis of liver: Secondary | ICD-10-CM

## 2024-07-31 LAB — CBC WITH DIFFERENTIAL/PLATELET
Basophils Absolute: 0 K/uL (ref 0.0–0.1)
Basophils Relative: 0.9 % (ref 0.0–3.0)
Eosinophils Absolute: 0.1 K/uL (ref 0.0–0.7)
Eosinophils Relative: 3.4 % (ref 0.0–5.0)
HCT: 32.5 % — ABNORMAL LOW (ref 36.0–46.0)
Hemoglobin: 10.9 g/dL — ABNORMAL LOW (ref 12.0–15.0)
Lymphocytes Relative: 22 % (ref 12.0–46.0)
Lymphs Abs: 0.7 K/uL (ref 0.7–4.0)
MCHC: 33.4 g/dL (ref 30.0–36.0)
MCV: 100.5 fl — ABNORMAL HIGH (ref 78.0–100.0)
Monocytes Absolute: 0.3 K/uL (ref 0.1–1.0)
Monocytes Relative: 10.1 % (ref 3.0–12.0)
Neutro Abs: 2 K/uL (ref 1.4–7.7)
Neutrophils Relative %: 63.6 % (ref 43.0–77.0)
Platelets: 105 K/uL — ABNORMAL LOW (ref 150.0–400.0)
RBC: 3.24 Mil/uL — ABNORMAL LOW (ref 3.87–5.11)
RDW: 19.6 % — ABNORMAL HIGH (ref 11.5–15.5)
WBC: 3.1 K/uL — ABNORMAL LOW (ref 4.0–10.5)

## 2024-07-31 LAB — COMPREHENSIVE METABOLIC PANEL WITH GFR
ALT: 50 U/L — ABNORMAL HIGH (ref 0–35)
AST: 78 U/L — ABNORMAL HIGH (ref 0–37)
Albumin: 3.3 g/dL — ABNORMAL LOW (ref 3.5–5.2)
Alkaline Phosphatase: 216 U/L — ABNORMAL HIGH (ref 39–117)
BUN: 21 mg/dL (ref 6–23)
CO2: 30 meq/L (ref 19–32)
Calcium: 9.9 mg/dL (ref 8.4–10.5)
Chloride: 101 meq/L (ref 96–112)
Creatinine, Ser: 1.19 mg/dL (ref 0.40–1.20)
GFR: 45.47 mL/min — ABNORMAL LOW (ref >60.00)
Glucose, Bld: 68 mg/dL — ABNORMAL LOW (ref 70–99)
Potassium: 4.2 meq/L (ref 3.5–5.1)
Sodium: 138 meq/L (ref 135–145)
Total Bilirubin: 1.2 mg/dL (ref 0.2–1.2)
Total Protein: 6.6 g/dL (ref 6.0–8.3)

## 2024-07-31 LAB — HEPATIC FUNCTION PANEL
ALT: 50 U/L — ABNORMAL HIGH (ref 0–35)
AST: 78 U/L — ABNORMAL HIGH (ref 0–37)
Albumin: 3.3 g/dL — ABNORMAL LOW (ref 3.5–5.2)
Alkaline Phosphatase: 216 U/L — ABNORMAL HIGH (ref 39–117)
Bilirubin, Direct: 0.3 mg/dL (ref 0.0–0.3)
Total Bilirubin: 1.2 mg/dL (ref 0.2–1.2)
Total Protein: 6.6 g/dL (ref 6.0–8.3)

## 2024-07-31 NOTE — Progress Notes (Signed)
 Chief Complaint  Patient presents with   Follow-up    RM 14 CVA      ASSESSMENT AND PLAN  [name] is a 73 y.o. female   Multiple small embolic stroke  Echocardiogram with bubble study showed right-to-left shunt, paroxysmal atrial fibrillation, seen by cardiologist, cirrhosis, frequent encephalopathy with elevated ammonia level is the main limiting factor for patient, not a good candidate for Watchman procedure as stroke prevention,  Complicated medical history, including cecum adenocarcinoma, cirrhosis secondary to oxaliplatin induced portal hypertension,  Continue aspirin  81 mg, encourage water intake, moderate exercise  Will only return to clinic for new issues     DIAGNOSTIC DATA (LABS, IMAGING, TESTING) - I reviewed patient records, labs, notes, testing and imaging myself where available.   MEDICAL HISTORY:  [name] is a 73 year old female accompanied by her husband follow-up for embolic stroke, seen in request by her primary care NP Wendee Agent    History is obtained from the patient and review of electronic medical records. I personally reviewed pertinent available imaging films in PACS.   PMHx of  Stroke Hypothyroidism Cecal adenocarcinoma, s/p resection and chemotherapy in 2004 Liver disease, cirrhosis,  Skin Cancer  Hypertension  She was seen by neurohospitalist Dr. Voncile on December 23, 2023 admission, she is not acting like her self, even though she suffered multiple hepatic encephalopathy in the past, that particular episode were different  Had MRI of the brain showed multiple small acute/subacute infarction involving right cerebellum, bilateral cerebral hemisphere, also evidence of chronic small vessel disease  She has complicated medical history  I reviewed [facility] evaluation  She has a history of stage III adenocarcinoma of the cecum, with matted lymph node mass measuring 4 cm, status post hemicolectomy and adjuvant FOLFX x 12  cycles from March to November 2004, also has multiple basal cell carcinoma of the skin  Decompensated cirrhosis secondary to oxaliplatin induced portal hypertension, portal hypertension and the 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  Reviewed cardiology evaluation from March 2025, ultrasound of carotid artery was negative, 2D echo showed ejection fraction 60 to 65%, positive bubble study, abdominal ultrasound, showed narrowing of TIPS based on velocity from previous procedure, may consider repeat stenting,  30 days cardiac monitoring in March showed no malignant arrhythmia  She has been stable over the past few months, on aspirin  81 mg since hospital discharge,  A1c was 4.8, INR 1.5, low albumin  of 2.9, abnormal liver functional test with mild elevation of alkaline phosphatase, Normal TSH, free T4  UPDATE Sept 8th 2025: She had a few normal hospital admission for encephalopathy, elevated ammonia level, most recent 1 was in August 2025, husband reported it was correlated with her thyroid  supplement, the dosage was adjusted since, and she is doing better  She is at her best now, able to ambulate, receiving physical therapy, cook again, no recurrent neurological symptoms, on aspirin  81 and stroke prevention  Had multiple repeat brain imaging with each of her hospital admission,  MRI of the brain May 24, 2024, expected left PCA infarction  without new event, advanced chronic small vessel disease  CT head June 19, 2024 no acute abnormality  She was seen by cardiologist July 2025, paroxysmal atrial fibrillation, potential Watchman procedure considered for stroke prevention, but currently not feasible due to frequent hepatic encephalopathy, small PFO is present, closure is not recommended,  PHYSICAL EXAM:   Vitals:   07/31/24 0900  BP: 112/66  Pulse: 88  SpO2: 98%  Weight: 156 lb (70.8 kg)  Height: 5' 4 (1.626 m)    Body mass index is 26.78 kg/m.  PHYSICAL EXAMNIATION:  Gen: NAD, conversant, well nourised, well groomed                     Cardiovascular: Regular rate rhythm, no peripheral edema, warm, nontender. Eyes: Conjunctivae clear without exudates or hemorrhage Neck: Supple, no carotid bruits. Pulmonary: Clear to auscultation bilaterally   NEUROLOGICAL EXAM:  MENTAL STATUS: Speech/cognition: Slow reaction, oriented to history taking and casual conversation CRANIAL NERVES: CN II: Visual fields are full to confrontation. Pupils are round equal and briskly reactive to light. CN III, IV, VI: extraocular movement are normal. No ptosis. CN V: Facial sensation is intact to light touch CN VII: Face is symmetric with normal eye closure  CN VIII: Hearing is normal to causal conversation. CN IX, X: Phonation is normal. CN XI: Head turning and shoulder shrug are intact  MOTOR: There is no pronator drift of out-stretched arms. Muscle bulk and tone are normal. Muscle strength is normal.  REFLEXES: Reflexes are 1 and symmetric at the biceps, triceps, knees, and ankles. Plantar responses are flexor.  SENSORY: Intact to light touch, pinprick and vibratory sensation are intact in fingers and toes.  COORDINATION: There is no trunk or limb dysmetria noted.  GAIT/STANCE: Push-up to get up from seated position, cautious  REVIEW OF SYSTEMS:  Full 14 system review of systems performed and notable only for as  above All other review of systems were negative.   ALLERGIES: Allergies  Allergen Reactions   Contrast Media [Iodinated Contrast Media] Hives    HOME MEDICATIONS: Current Outpatient Medications  Medication Sig Dispense Refill   Accu-Chek Softclix Lancets lancets 3 (three) times daily.     aspirin  EC 81 MG tablet Take 1 tablet (81 mg total) by mouth daily. Swallow whole. 90 tablet 0   Blood Glucose Monitoring Suppl DEVI 1 each by Does not apply route in the morning, at noon, and at bedtime. May substitute to any manufacturer covered by patient's insurance. 1 each 0   CRANBERRY PO Take 1 tablet by mouth 2 (two) times daily.     ferrous sulfate  325 (65 FE) MG tablet Take 1 tablet (325 mg total) by mouth 2 (two) times daily with a meal. 180 tablet 0   furosemide  (LASIX ) 20 MG tablet Take 20-40 mg by mouth See admin instructions. Take 40 mg by mouth every morning. Decrease the dose to 20 mg if the blood pressure is low.     hydrocortisone  (ANUSOL -HC) 2.5 % rectal cream Place 1 Application rectally 2 (two) times daily. 30 g 0   lactulose  (CHRONULAC ) 10 GM/15ML solution Take 45 mLs (30 g total) by mouth 4 (four) times daily. 236 mL 0   levOCARNitine  (CARNITOR ) 330 MG tablet Take 330 mg by mouth See admin instructions. Take 330 mg by mouth with breakfast, between 3-5 PM, and at bedtime     levothyroxine  (SYNTHROID ) 50 MCG tablet  Take 1.5 tablets (75 mcg total) by mouth daily before breakfast. 45 tablet 0   liver oil-zinc  oxide (DESITIN) 40 % ointment Apply topically daily as needed for irritation. 56.7 g 0   magnesium  oxide (MAG-OX) 400 MG tablet Take 1 tablet (400 mg total) by mouth daily. (Patient taking differently: Take 400 mg by mouth in the morning.) 30 tablet 0   Multiple Vitamin (MULTI-VITAMIN) tablet Take 1 tablet by mouth in the morning.     pantoprazole  (PROTONIX ) 40 MG tablet Take 1 tablet (40 mg total) by mouth 2 (two) times daily. (Patient taking differently: Take 40 mg by mouth 2  (two) times daily before a meal.) 60 tablet 0   Phenylephrine  HCl (PREPARATION H RE) Place 1 Dose rectally as needed (hemorrhoids).     polyethylene glycol (MIRALAX  / GLYCOLAX ) 17 g packet Take 17 g by mouth daily. 14 each 0   Probiotic Product (PROBIOTIC PEARLS WOMENS) CAPS Take 1 capsule by mouth in the morning.     PROTEIN PO Take 1 Bottle by mouth See admin instructions. Fair Life Nutrition Protein Shake- Drink 1 bottle by mouth daily if she can stomach it.     rifaximin  (XIFAXAN ) 550 MG TABS tablet Take 1 tablet (550 mg total) by mouth 2 (two) times daily. (Patient taking differently: Take 550 mg by mouth in the morning and at bedtime.) 60 tablet 0   spironolactone  (ALDACTONE ) 50 MG tablet Take 1 tablet (50 mg total) by mouth daily. 30 tablet 1   midodrine  (PROAMATINE ) 10 MG tablet Take 1 tablet (10 mg total) by mouth 3 (three) times daily with meals. (Patient not taking: Reported on 07/31/2024) 180 tablet 0   potassium chloride  SA (KLOR-CON  M) 20 MEQ tablet Take 1 tablet (20 mEq total) by mouth daily. (Patient not taking: Reported on 07/31/2024) 10 tablet 0   No current facility-administered medications for this visit.    PAST MEDICAL HISTORY: Past Medical History:  Diagnosis Date   Acute hepatic encephalopathy (HCC) 03/30/2024   Acute urinary retention 05/04/2022   Allergy 2006 ?   Contrast dye   Arthritis 2016 ??   Knees and thumb   Cancer [facility])    cecum   Cataract 2021   Surgery scheduled July 2023   Colon cancer [facility]) 2003   Elevated liver function tests    Esophageal varices (HCC)    Heart murmur On file   Hemorrhage of gastrointestinal tract 05/04/2011   Hypertension 2021   Hypothyroidism    Iron deficiency anemia    Liver disease    chemotherapy complication, per pt, shunts placed to bypass liver   Malignant neoplasm of cecum (HCC)    Portal hypertension (HCC)    Skin cancer 2019   Splenomegaly     PAST SURGICAL HISTORY: Past Surgical History:  Procedure  Laterality Date   COLON SURGERY  2004   Cancer   COSMETIC SURGERY  2021   Skin cancer   ESOPHAGEAL VARICE LIGATION     ESOPHAGOGASTRODUODENOSCOPY N/A 05/28/2024   Procedure: EGD (ESOPHAGOGASTRODUODENOSCOPY);  Surgeon: Albertus Gordy HERO, MD;  Location: [facility];  Service: Gastroenterology;  Laterality: N/A;   EYE SURGERY     HEMICOLECTOMY  01/08/2003   IR RADIOLOGIST EVAL & MGMT  12/20/2020   IR RADIOLOGIST EVAL & MGMT  05/29/2021   LIVER SURGERY     shunts placed after chemo complication   SKIN FULL THICKNESS GRAFT N/A 09/12/2019   Procedure: debridement and FTSG to the nose from left upper arm;  Surgeon: Elisabeth Craig RAMAN, MD;  Location: [facility];  Service: Plastics;  Laterality: N/A;  2 hours, please   TIPS PROCEDURE      FAMILY HISTORY: Family History  Problem Relation Age of Onset   Arthritis Mother    Hearing loss Mother    Heart disease Mother    Hypertension Mother    Miscarriages / [place] Mother    Arthritis Father    Diabetes Father    Heart disease Father    Cancer Maternal Aunt    Breast cancer Neg Hx    Colon cancer Neg Hx    Esophageal cancer Neg Hx    Pancreatic cancer Neg Hx    Stomach cancer Neg Hx    Stroke Neg Hx     SOCIAL HISTORY: Social History   Socioeconomic History   Marital status: Married    Spouse name: Animal nutritionist   Number of children: 2   Years of education: Not on file   Highest education level: Some college, no degree  Occupational History   Occupation: Comptroller  Tobacco Use   Smoking status: Never   Smokeless tobacco: Never   Tobacco comments:    Never smoked  Vaping Use   Vaping status: Never Used  Substance and Sexual Activity   Alcohol  use: Never   Drug use: Never   Sexual activity: Not Currently    Partners: Male  Other Topics Concern   Not on file  Social History Narrative   12/04/20   From: the area   Living: with husband, Valere (1994)   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      Family: 2  children Designer, fashion/clothing and Optometrist - 2 grandchildren - nearby      Enjoys: read      Exercise: trying to get back to exercise   Diet: healthy, limits fast foods      Safety   Seat belts: Yes    Guns: Yes  and secure   Safe in relationships: Yes    Social Drivers of Corporate investment banker Strain: Low Risk  (06/15/2024)   Overall Financial Resource Strain (CARDIA)    Difficulty of Paying Living Expenses: Not hard at all  Food Insecurity: No Food Insecurity (07/01/2024)   Hunger Vital Sign    Worried About Running Out of Food in the Last Year: Never true    Ran Out of Food in the Last Year: Never true  Transportation Needs: No Transportation Needs (07/01/2024)   PRAPARE - Administrator, Civil Service (Medical): No    Lack of Transportation (Non-Medical): No  Physical Activity: Insufficiently Active (05/10/2024)   Exercise Vital Sign    Days of Exercise per Week: 2 days    Minutes of Exercise per Session: 20 min  Stress: No Stress Concern Present (05/10/2024)   [employer] of Occupational Health - Occupational Stress Questionnaire    Feeling of Stress: Only a little  Social Connections: Socially Integrated (07/01/2024)   Social Connection and Isolation Panel    Frequency of Communication with Friends and Family: More than three times a week    Frequency of Social Gatherings with Friends and Family: Three times a week    Attends Religious Services: More than 4 times per year    Active Member of Clubs or Organizations: Yes    Attends Banker Meetings: More than 4 times per year    Marital Status: Married  Intimate Partner Violence: Not At Risk (07/01/2024)  Humiliation, Afraid, Rape, and Kick questionnaire    Fear of Current or Ex-Partner: No    Emotionally Abused: No    Physically Abused: No    Sexually Abused: No      Modena Callander, M.D. Ph.D.  [facility] [address], [address] [place], [place] [address] Ph: [phone] Fax:  [phone]  CC:  [name], NP [address] [place],  [place] [address]  [name], NP

## 2024-07-31 NOTE — Progress Notes (Unsigned)
 07/31/2024 Lakira Ogando [id number] 1951-01-26   Chief Complaint: EGD follow up  History of Present Illness: [name] is a 73 year old female with a past medical history of adenocarcinoma to the cecum Stage IIIB, T3N1 with matted lymph node mass measuring 4 cm, S/P hemicolectomy and adjuvant FOLFOX x 12 cycles from 02/05/03-09/24/03 and decompensated cirrhosis likely secondary to Oxaliplatin induced portal hypertension and possible component of MASH. She developed Oxaliplatin induced portal hypertension with esophageal varices and had a variceal hemorrhage in 2012 for which she underwent TIPS at [facility]. She did not experience encephalopathy until contracting COVID-19. She is known by Dr. Abran and she is followed by [facility].  She has had multiple hospital admissions with hepatic encephalopathy. Her most recent hospitalization was 8/9 - 07/03/2024 altered mental status and high ammonia levels.  She was hypothermic, Temp 92.42F with leukocytosis. ammonia 248 -> 83. She responded to Lactulose  oral and enema. Blood cultures were negative.   Thyroid  medicine was adjusted in Feb and 2 months had several hospital admissions. Levothyroxine , best now x 4 weeks.   Lactulose  3 tablespoons qid,  reduced to tid f or the past 2 eweks. 4 Huge BM or one stool.  No hand thumping Shaking is gone too Clarity of mind is good  Working on energy level  Breakfast beans, cheese, potatoes red,  Doesn't like eggs Yogurt Smoothie  Stirring into grits  [place] bacon   Lunch and dinner always  No bloody or black stools   Normal brown.   Colonoscopy within 10 year      Latest Ref Rng & Units 07/05/2024   11:21 AM 07/03/2024    4:42 AM 07/02/2024    4:45 AM  CBC  WBC 4.0 - 10.5 K/uL 2.5  2.6  2.7   Hemoglobin 12.0 - 15.0 g/dL 89.2  8.5  9.5   Hematocrit 36.0 - 46.0 % 31.9  27.8  30.3   Platelets 150.0 - 400.0 K/uL 164.0  114  126         Latest Ref Rng & Units 07/05/2024    11:21 AM 07/03/2024    4:42 AM 07/02/2024    4:45 AM  CMP  Glucose 70 - 99 mg/dL 876  68  87   BUN 6 - 23 mg/dL 16  13  12    Creatinine 0.40 - 1.20 mg/dL 8.73  8.96  8.90   Sodium 135 - 145 mEq/L 142  141  139   Potassium 3.5 - 5.1 mEq/L 3.4  3.9  4.2   Chloride 96 - 112 mEq/L 106  110  107   CO2 19 - 32 mEq/L 31  24  23    Calcium  8.4 - 10.5 mg/dL 9.1  8.7  8.8   Total Protein 6.0 - 8.3 g/dL 6.4  5.2  5.5   Total Bilirubin 0.2 - 1.2 mg/dL 1.2  1.2  1.2   Alkaline Phos 39 - 117 U/L 186  169  167   AST 0 - 37 U/L 65  55  65   ALT 0 - 35 U/L 39  37  42     EGD 05/28/2024: - Normal esophagus.  - Small hiatal hernia.  - No esophageal or gastric varices. - Non-bleeding gastric ulcers with antral gastritis Biopsied. - Normal examined duodenum.  A. STOMACH, BIOPSY:  - Chronic inactive gastritis.  - Immunohistochemical stain for H. pylori is negative.   - Pantoprazole  40 mg BID-AC x  4 weeks then once daily x 4 weeks. Given aspirin  use could consider long-term once daily use, unless H. Pylori is found on the biopsies and it is successfully treated. - Await pathology results. - Continue lactulose  and rifaximin . - Expect discharge home soon with close interval followup with [facility]   Current Medications, Allergies, Past Medical History, Past Surgical History, Family History and Social History were reviewed in [employer] record.   Review of Systems:   Constitutional: Negative for fever, sweats, chills or weight loss.  Respiratory: Negative for shortness of breath.   Cardiovascular: Negative for chest pain, palpitations and leg swelling.  Gastrointestinal: See HPI.  Musculoskeletal: Negative for back pain or muscle aches.  Neurological: Negative for dizziness, headaches or paresthesias.    Physical Exam: BP (!) 90/58   Temp (!) 78 F (25.6 C)   Ht 5' 4 (1.626 m)   Wt 156 lb 6 oz (70.9 kg)   BMI 26.84 kg/m   Wt Readings from Last 3 Encounters:   07/31/24 156 lb 6 oz (70.9 kg)  07/31/24 156 lb (70.8 kg)  07/12/24 152 lb (68.9 kg)    General: in no acute distress. Head: Normocephalic and atraumatic. Eyes: No scleral icterus. Conjunctiva pink . Ears: Normal auditory acuity. Mouth: Dentition intact. No ulcers or lesions.  Lungs: Clear throughout to auscultation. Heart: Regular rate and rhythm, no murmur. Abdomen: Soft, nontender and nondistended. No masses or hepatomegaly. Normal bowel sounds x 4 quadrants.  Rectal: *** Musculoskeletal: Symmetrical with no gross deformities. Extremities: No edema. Neurological: Alert oriented x 4. No focal deficits.  Psychological: Alert and cooperative. Normal mood and affect  Assessment and Recommendations:

## 2024-07-31 NOTE — Patient Instructions (Signed)
 Your provider has requested that you go to the basement level for lab work before leaving today. Press B on the elevator. The lab is located at the first door on the left as you exit the elevator.  Due to recent changes in healthcare laws, you may see the results of your imaging and laboratory studies on MyChart before your provider has had a chance to review them.  We understand that in some cases there may be results that are confusing or concerning to you. Not all laboratory results come back in the same time frame and the provider may be waiting for multiple results in order to interpret others.  Please give us  48 hours in order for your provider to thoroughly review all the results before contacting the office for clarification of your results.    Increase protein in diet (1.5 gm/kg).  Follow up with Dr. Abran in 3 months.  Thank you for trusting me with your gastrointestinal care!   Elida Shawl, CRNP   _______________________________________________________  If your blood pressure at your visit was 140/90 or greater, please contact your primary care physician to follow up on this.  _______________________________________________________  If you are age 48 or older, your body mass index should be between 23-30. Your Body mass index is 26.84 kg/m. If this is out of the aforementioned range listed, please consider follow up with your Primary Care Provider.  If you are age 57 or younger, your body mass index should be between 19-25. Your Body mass index is 26.84 kg/m. If this is out of the aformentioned range listed, please consider follow up with your Primary Care Provider.   ________________________________________________________  The [facility] providers would like to encourage you to use MYCHART to communicate with providers for non-urgent requests or questions.  Due to long hold times on the telephone, sending your provider a message by [facility] may be a faster and more  efficient way to get a response.  Please allow 48 business hours for a response.  Please remember that this is for non-urgent requests.  _______________________________________________________  [facility] is using a team-based approach to care.  Your team is made up of your doctor and two to three APPS. Our APPS (Nurse Practitioners and Physician Assistants) work with your physician to ensure care continuity for you. They are fully qualified to address your health concerns and develop a treatment plan. They communicate directly with your gastroenterologist to care for you. Seeing the Advanced Practice Practitioners on your physician's team can help you by facilitating care more promptly, often allowing for earlier appointments, access to diagnostic testing, procedures, and other specialty referrals.

## 2024-08-01 ENCOUNTER — Ambulatory Visit: Payer: Self-pay | Admitting: Nurse Practitioner

## 2024-08-01 ENCOUNTER — Encounter: Payer: Self-pay | Admitting: Nurse Practitioner

## 2024-08-01 DIAGNOSIS — K7460 Unspecified cirrhosis of liver: Secondary | ICD-10-CM

## 2024-08-01 LAB — PROTIME-INR
INR: 1.3 — ABNORMAL HIGH
Prothrombin Time: 13.5 s — ABNORMAL HIGH (ref 9.0–11.5)

## 2024-08-01 NOTE — Progress Notes (Signed)
 1.  Pantoprazole  daily indefinitely 2.  No follow-up EGD required 3.  No future surveillance colonoscopy recommended

## 2024-08-01 NOTE — Progress Notes (Signed)
 [name], pls contact patient and husband, let patient know Dr. Abran wants her to stay on Pantoprazole  40mg  once daily indefinitely. Pls send in RX for Pantoprazole  40mg  once daily to be taken 30 minutes before breakfast. # 90, 3 refills. Let patient know Dr. Abran did not recommend a repeat EGD at this time or any further surveillance colonoscopies. THX.

## 2024-08-02 ENCOUNTER — Telehealth: Payer: Self-pay

## 2024-08-02 ENCOUNTER — Ambulatory Visit: Admitting: Nurse Practitioner

## 2024-08-02 VITALS — BP 112/72 | HR 60 | Temp 97.5°F | Ht 64.0 in | Wt 157.2 lb

## 2024-08-02 DIAGNOSIS — E039 Hypothyroidism, unspecified: Secondary | ICD-10-CM | POA: Diagnosis not present

## 2024-08-02 DIAGNOSIS — K7460 Unspecified cirrhosis of liver: Secondary | ICD-10-CM | POA: Diagnosis not present

## 2024-08-02 MED ORDER — PANTOPRAZOLE SODIUM 40 MG PO TBEC: 40.0000 mg | DELAYED_RELEASE_TABLET | Freq: Every day | ORAL | 3 refills | Status: AC

## 2024-08-02 NOTE — Telephone Encounter (Signed)
 Spoke to patient and her husband and relayed information from [place] - she will continue to take once daily Pantoprazole  indefinitely and there are no recommendations for and egd or surveillance colonoscopies at this time.  Both agreed

## 2024-08-02 NOTE — Progress Notes (Signed)
 Established Patient Office Visit  Subjective   Patient ID: [name], female    DOB: 08-27-1951  Age: 73 y.o. MRN: [id number]  Chief Complaint  Patient presents with   Follow-up    Pt complains that she is doing very well. Pt wants to know if she needs to start midodrine . Pt also would like to discuss if its okay to get the flu vaccine.     HPI  Discussed the use of AI scribe software for clinical note transcription with the patient, who gave verbal consent to proceed.  History of Present Illness [name] is a 73 year old female with liver disease and thyroid  issues who presents for follow-up post-hospitalization.  She was recently hospitalized and is now following up post-discharge. She has seen a gastroenterologist who restarted her on pantoprazole . Recent blood work was discussed with the gastroenterologist, who noted that her hemoglobin was a little low, but otherwise the visit was described as good. The gastroenterologist discussed the possibility of a colonoscopy but decided it was probably not needed at this time because she is doing well.  She is currently taking lactulose , initially increased to four tablespoons four times a day, but due to excessive bowel movements, she adjusted the dose to three tablespoons three times a day. This adjustment has resulted in four to five bowel movements per day, which she finds manageable. She continues to take Xifaxan , which she believes contributes to her current stability.  Her thyroid  levels have fluctuated over the past few months but are now stable. She attributes previous symptoms of shakiness to thyroid  imbalances, which have since resolved after adjustments in her thyroid  management.  Her blood pressure has been variable, with recent readings as low as 90/58, leading her to adjust her furosemide  intake. She takes one furosemide  when her blood pressure is low and two when it is above 100/50. She is also on  spironolactone .  She reports good fluid intake, driven by a fear of hospitalization, and her appetite has improved significantly. She is active around the house, cooking and attempting to learn to use a new washer and dryer.  No fever, chills, chest pain, shortness of breath, dizziness, or lightheadedness. No issues with eating, drinking, or bowel and bladder function.     Review of Systems  Constitutional:  Negative for chills and fever.  Respiratory:  Negative for shortness of breath.   Cardiovascular:  Negative for chest pain.  Gastrointestinal:  Negative for abdominal pain, constipation, diarrhea and nausea.  Neurological:  Negative for dizziness and headaches.      Objective:     BP 112/72   Pulse 60   Temp (!) 97.5 F (36.4 C) (Oral)   Ht 5' 4 (1.626 m)   Wt 157 lb 3.2 oz (71.3 kg)   SpO2 100%   BMI 26.98 kg/m  BP Readings from Last 3 Encounters:  08/02/24 112/72  07/31/24 (!) 90/58  07/31/24 112/66   Wt Readings from Last 3 Encounters:  08/02/24 157 lb 3.2 oz (71.3 kg)  07/31/24 156 lb 6 oz (70.9 kg)  07/31/24 156 lb (70.8 kg)   SpO2 Readings from Last 3 Encounters:  08/02/24 100%  07/31/24 98%  07/12/24 97%      Physical Exam Vitals and nursing note reviewed.  Constitutional:      Appearance: Normal appearance.  Cardiovascular:     Rate and Rhythm: Normal rate and regular rhythm.     Heart sounds: Normal heart sounds.  Pulmonary:  Effort: Pulmonary effort is normal.     Breath sounds: Normal breath sounds.  Abdominal:     General: Bowel sounds are normal.  Musculoskeletal:     Right lower leg: Edema present.     Left lower leg: Edema present.  Neurological:     Mental Status: She is alert.      No results found for any visits on 08/02/24.    The ASCVD Risk score (Arnett DK, et al., 2019) failed to calculate for the following reasons:   Risk score cannot be calculated because patient has a medical history suggesting prior/existing  ASCVD    Assessment & Plan:   Problem List Items Addressed This Visit   None Assessment and Plan Assessment & Plan Hepatic encephalopathy and ascites with lower extremity edema Hepatic encephalopathy controlled with lactulose  and Xifaxan . Ascites and edema present with fluid retention. - Continue lactulose  3 tablespoons orally three times daily. - Continue Xifaxan  as prescribed. - Monitor bowel movements for 2-3 per day. - Encourage fluid intake to prevent dehydration. - Consider compression garments for edema. - Monitor for fluid overload or weeping edema.  Hypotension Variable blood pressure with instances of asymptomatic hypotension. Concern for fluid balance affecting diuretic therapy. Midodrine  considered for symptomatic cases. - Monitor blood pressure regularly, especially in the morning. - Administer midodrine  if symptomatic hypotension occurs. - Continue furosemide  cautiously, adjusting dosage based on blood pressure. - Ensure adequate fluid intake to maintain intravascular volume.  Hypothyroidism Thyroid  function tests normal, symptoms resolved, and levels stabilized under endocrinology care. - Continue monitoring thyroid  function as per endocrinology recommendations.   Return in about 3 months (around 11/01/2024) for tsh/bp/swelling.    Katherine Crandall, NP

## 2024-08-08 ENCOUNTER — Encounter: Payer: Self-pay | Admitting: Physical Therapy

## 2024-08-08 ENCOUNTER — Ambulatory Visit: Attending: Nurse Practitioner | Admitting: Physical Therapy

## 2024-08-08 DIAGNOSIS — R531 Weakness: Secondary | ICD-10-CM | POA: Insufficient documentation

## 2024-08-08 DIAGNOSIS — R2681 Unsteadiness on feet: Secondary | ICD-10-CM | POA: Diagnosis not present

## 2024-08-08 DIAGNOSIS — R5381 Other malaise: Secondary | ICD-10-CM | POA: Diagnosis not present

## 2024-08-08 DIAGNOSIS — K7682 Hepatic encephalopathy: Secondary | ICD-10-CM | POA: Insufficient documentation

## 2024-08-08 DIAGNOSIS — M6281 Muscle weakness (generalized): Secondary | ICD-10-CM | POA: Diagnosis not present

## 2024-08-08 DIAGNOSIS — R2689 Other abnormalities of gait and mobility: Secondary | ICD-10-CM | POA: Diagnosis not present

## 2024-08-08 NOTE — Therapy (Unsigned)
 OUTPATIENT PHYSICAL THERAPY NEURO EVALUATION   Patient Name: [name] MRN: [id number] DOB:1950-12-07, 73 y.o., female Today's Date: 08/09/2024   PCP: [name], NP REFERRING PROVIDER: [name], NP  END OF SESSION:  PT End of Session - 08/09/24 0841     Visit Number 1    Number of Visits 17    Date for PT Re-Evaluation 10/06/24    Authorization Type [employer] - Auth. required    Progress Note Due on Visit 10    PT Start Time 1402    PT Stop Time 1446    PT Time Calculation (min) 44 min    Activity Tolerance Patient tolerated treatment well    Behavior During Therapy [facility] for tasks assessed/performed          Past Medical History:  Diagnosis Date   Acute hepatic encephalopathy (HCC) 03/30/2024   Acute urinary retention 05/04/2022   Allergy 2006 ?   Contrast dye   Arthritis 2016 ??   Knees and thumb   Cancer [facility])    cecum   Cataract 2021   Surgery scheduled July 2023   Colon cancer [facility]) 2003   Elevated liver function tests    Esophageal varices (HCC)    Heart murmur On file   Hemorrhage of gastrointestinal tract 05/04/2011   Hepatic encephalopathy (HCC) 2025   Hypertension 2021   Hypothyroidism    Iron deficiency anemia    Liver disease    chemotherapy complication, per pt, shunts placed to bypass liver   Malignant neoplasm of cecum (HCC)    Portal hypertension (HCC)    Skin cancer 2019   Splenomegaly    Past Surgical History:  Procedure Laterality Date   COLON SURGERY  2004   Cancer   COSMETIC SURGERY  2021   Skin cancer   ESOPHAGEAL VARICE LIGATION     ESOPHAGOGASTRODUODENOSCOPY N/A 05/28/2024   Procedure: EGD (ESOPHAGOGASTRODUODENOSCOPY);  Surgeon: Albertus Gordy HERO, MD;  Location: [facility];  Service: Gastroenterology;  Laterality: N/A;   EYE SURGERY     HEMICOLECTOMY  01/08/2003   IR RADIOLOGIST EVAL & MGMT  12/20/2020   IR RADIOLOGIST EVAL & MGMT  05/29/2021   LIVER SURGERY     shunts placed after chemo complication   SKIN  FULL THICKNESS GRAFT N/A 09/12/2019   Procedure: debridement and FTSG to the nose from left upper arm;  Surgeon: Elisabeth Craig RAMAN, MD;  Location: [facility];  Service: Plastics;  Laterality: N/A;  2 hours, please   TIPS PROCEDURE     Patient Active Problem List   Diagnosis Date Noted   AMS (altered mental status) 07/01/2024   Heme positive stool 05/28/2024   Acute gastric ulcer without hemorrhage or perforation 05/28/2024   Gastritis and gastroduodenitis 05/28/2024   Acute on chronic anemia 05/27/2024   SIRS (systemic inflammatory response syndrome) (HCC) 05/21/2024   Acute prerenal azotemia 05/21/2024   Paroxysmal atrial fibrillation (HCC) 05/21/2024   Acute metabolic encephalopathy 05/20/2024   Vision disturbance 05/11/2024   Patent foramen ovale 04/16/2024   Acute CVA (cerebrovascular accident) (HCC) 04/15/2024   Abnormal CBC 04/10/2024   Hyperkalemia 04/05/2024   Hypoglycemia 04/05/2024   Macrocytic anemia 04/05/2024   Cerebrovascular accident (CVA) (HCC) 03/16/2024   History of stroke 01/06/2024   Thrombocytopenia (HCC) 12/21/2023   Decreased GFR 12/10/2023   Preventative health care 09/09/2023   Lower extremity edema 08/10/2023   Cellulitis of both lower extremities 08/04/2023   Altered mental status 08/04/2023  Bradycardia 05/07/2023   Protein-calorie malnutrition, severe (HCC) 02/26/2023   Weight loss 02/24/2023   Other constipation 02/24/2023   Generalized abdominal pain 02/24/2023   Hospital discharge follow-up 02/24/2023   Weakness 01/04/2023   Frequent UTI 05/18/2022   Anemia of chronic disease 05/18/2022   AKI (acute kidney injury) (HCC) 04/30/2022   Tachycardia-bradycardia syndrome (HCC) 02/05/2022   Hypomagnesemia 02/04/2022   Hepatic encephalopathy (HCC) 02/03/2022   Basal cell carcinoma (BCC) 05/06/2021   Hypertension 05/06/2021   Cirrhosis (HCC) 12/04/2020   Murmur, cardiac 03/19/2020   Acquired hypothyroidism 06/30/2019   History of  basal cell cancer 06/30/2019   Hx of colon cancer, stage III 11/30/2011   Portal hypertension (HCC) 01/23/2008    ONSET DATE: 07-01-24 for most recent hospitalization  REFERRING DIAG:  Diagnosis  K76.82 (ICD-10-CM) - Hepatic encephalopathy (HCC)  R53.1 (ICD-10-CM) - Weakness    THERAPY DIAG:  Other abnormalities of gait and mobility  Muscle weakness (generalized)  Unsteadiness on feet  Physical deconditioning  Rationale for Evaluation and Treatment: Rehabilitation  SUBJECTIVE:                                                                                                                                                                                             SUBJECTIVE STATEMENT: Pt presents to PT eval using SPC with small quad tip for assistance with ambulation; accompanied by her husband providing CGA. Pt was hospitalized 07-01-24 - 07-03-24 with hepatic encephalopathy.  Husband reports pt had a great week last week with improved mobility noted - he states pt walked across the room last night carrying something (paper) in each hand but did not use the cane.  He reports he has noticed some tremors in her hands this week (pt demos with arms extended in front of her and hands mildly shaking);  says he has to watch her and see if anything else is going on.  Pt was seen at this facility from 5-30 - 05-19-24 for 8 visits and had to be discharged due to change in medical status.  Pt accompanied by: spouse, [name]  PERTINENT HISTORY:  Per chart note 07-01-24; past medical history significant for cirrhosis, prior hepatic encephalopathy, acquired hypothyroidism, presented to hospital with altered mental status and elevated ammonia level for 5 days with generalized weakness.  hospitalized 07-01-24 - 07-03-24 with hepatic encephalopathy PMHx of  Stroke Hypothyroidism Cecal adenocarcinoma, s/p resection and chemotherapy in 2004 Liver disease, cirrhosis,  Skin Cancer  Hypertension  PAIN:  Are  you having pain? No  PRECAUTIONS: Fall  RED FLAGS: None   WEIGHT BEARING RESTRICTIONS: No  FALLS: Has patient fallen  in last 6 months? No  LIVING ENVIRONMENT: Lives with: lives with their spouse Lives in: House/apartment Stairs: No Has following equipment at home: Ramped entry; SPC, RW, shower chair, manual wheelchair, BSC  PLOF: Independent with basic ADLs, Independent with household mobility with device, Independent with transfers, and Needs assistance with homemaking  PATIENT GOALS: walk without my cane; increase energy   OBJECTIVE:  Note: Objective measures were completed at Evaluation unless otherwise noted.  DIAGNOSTIC FINDINGS: CT scan 06-19-24 IMPRESSION: 1. No evidence of acute intracranial abnormality. 2. Similar prior left PCA territory infarct and chronic microvascular ischemic change.  COGNITION: Overall cognitive status: Within functional limits for tasks assessed   SENSATION: Not tested  COORDINATION: WFL's bil. LE's    POSTURE: rounded shoulders, forward head, and increased thoracic kyphosis  LOWER EXTREMITY ROM:   WFL's bil. LE's   LOWER EXTREMITY MMT:    MMT Right Eval Left Eval  Hip flexion 4 4  Hip extension    Hip abduction    Hip adduction    Hip internal rotation    Hip external rotation    Knee flexion 4 4  Knee extension 5 5  Ankle dorsiflexion 5 5  Ankle plantarflexion    Ankle inversion    Ankle eversion    (Blank rows = not tested)  BED MOBILITY:  Not tested =pt reports independent with bed mobility  TRANSFERS: Sit to stand: Modified independence  Assistive device utilized: bil. Armrests of chair     Stand to sit: Modified independence  Assistive device utilized: bil. Armrests of chair      RAMP:  Not tested  CURB:  Not tested  STAIRS: Not tested GAIT: Findings: Gait Characteristics: step through pattern, decreased step length- Right, and decreased step length- Left, Distance walked: 75', Assistive device  utilized:Single point cane, Level of assistance: CGA, and Comments: SPC with small quad tip used during eval; pt reports she is beginning to ambulate very short distances in home without use of SPC; slow gait speed   FUNCTIONAL TESTS:  5 times sit to stand: 35.88 secs without UE support - from chair Timed up and go (TUG): 19.38 secs with SPC 10 meter walk test:  20.65 secs = 1.59 ft/sec                                                                                                                                TREATMENT:  08-08-24 [employer]  Access Code: [email or web address] URL: [email or web address] Date: 08/09/2024 Prepared by: Rock Kussmaul  Exercises - Sit to Stand  - 1 x daily - 7 x weekly - 1 sets - 10 reps - Standing March with Counter Support  - 1 x daily - 7 x weekly - 1 sets - 10 reps DATE: 08-08-24   PATIENT EDUCATION: Education details: eval results, goals, POC including recommended frequency and duration; HEP - Medbridge  [email or web address] Person educated: Patient and Spouse Education method: Explanation, Demonstration, and Handouts  Education comprehension: verbalized understanding and needs further education  HOME EXERCISE PROGRAM: See above - Medbridge [email or web address]  GOALS: Goals reviewed with patient? Yes  SHORT TERM GOALS: Target date: 09-08-24  Improve 5x sit to stand transfer score to </= 28 secs without UE support from chair to demo improved LE strength.  Baseline:  5 times sit to stand: 35.88 secs without UE support - from chair Goal status: INITIAL  2.  Improve TUG score to </= 16 secs with SPC to demonstrate reduced fall risk. Baseline: 19.38 secs with SPC Goal status: INITIAL  3.  Increase gait velocity to >/= 1.9 ft/sec with use of SPC for increased gait efficiency. Baseline:  10 meter walk test:  20.65 secs = 1.59 ft/sec with SPC  Goal status: INITIAL  4.  Ambulate 64' without use of SPC with supervision for household ambulation.  Baseline:  Goal status:  INITIAL  5.   Amb. 2 nonstop with use of SPC with SBA to CGA to demonstrate improved endurance/activity tolerance. Baseline:  Goal status: INITIAL  6.  Independent in HEP for balance and LE strengthening.  Baseline:  Goal status: INITIAL    LONG TERM GOALS: Target date: 10-06-24  Improve 5x sit to stand transfer score to </= 22 secs without UE support from chair to demo improved LE strength.  Baseline:  5 times sit to stand: 35.88 secs without UE support - from chair Goal status: INITIAL  2.  Improve TUG score to </= 13.5 secs with SPC to demonstrate reduced fall risk. Baseline: 19.38 secs with SPC Goal status: INITIAL  3.  Increase Berg balance test score by at least 5 points to demo improved balance and decreased fall risk.  Baseline: TBA Goal status: INITIAL  4.  Pt will report modified independent household ambulation without use of SPC. Baseline:  Goal status: INITIAL  5.  Increase gait velocity to >/= 2.4 ft/sec with use of SPC for increased gait efficiency. Baseline:  10 meter walk test:  20.65 secs = 1.59 ft/sec with SPC  Goal status: INITIAL  6.  Amb. 4 nonstop with use of SPC with SBA for increased community accessibility.  Baseline:  Goal status: INITIAL  ASSESSMENT:  CLINICAL IMPRESSION: Patient is a 73 y.o. lady who was seen today for physical therapy evaluation and treatment for gait and balance impairments and deconditioning due to acute hepatic encephalopathy and multiple hospitalizations in past 8 months.  Pt is currently amb. Short distances With [facility] with small quad tip with CGA to SBA for community ambulation.  Pt's TUG score of 19.38 secs with SPC is indicative of fall risk as score > 13.5 secs.  Pt has slow gait speed with gait velocity 1.59 ft/sec with use of SPC.  Pt had moderate difficulty performing 5x sit to stand transfers without UE support from chair with score 35.88 secs.  Pt will benefit from skilled PT to address LE weakness, decreased  endurance/activity tolerance and gait & balance deficits.   OBJECTIVE IMPAIRMENTS: decreased activity tolerance, decreased balance, decreased endurance, and difficulty walking.   ACTIVITY LIMITATIONS: carrying, lifting, bending, squatting, stairs, and locomotion level  PARTICIPATION LIMITATIONS: meal prep, cleaning, laundry, driving, shopping, and community activity  PERSONAL FACTORS: Behavior pattern, Past/current experiences, Time since onset of injury/illness/exacerbation, and 1-2 comorbidities: h/o hepatic encephalopathy with multiple hospitalizations and h/o CVA are also affecting patient's functional outcome.   REHAB POTENTIAL: Good  CLINICAL DECISION MAKING: Evolving/moderate complexity  EVALUATION COMPLEXITY: Moderate  PLAN:  PT FREQUENCY: 2x/week  PT  DURATION: 8 weeks  PLANNED INTERVENTIONS: 97110-Therapeutic exercises, 97530- Therapeutic activity, [id number]- Neuromuscular re-education, [phone]- Self Care, [id number]- Gait training, and Patient/Family education  PLAN FOR NEXT SESSION: check HEP (2 exercises issued) for any questions; add balance exercises to HEP; do Berg balance test; gait train with SPC - try amb. 1 nonstop   Roxanna [place], PT 08/09/2024, 11:05 AM

## 2024-08-14 ENCOUNTER — Ambulatory Visit (INDEPENDENT_AMBULATORY_CARE_PROVIDER_SITE_OTHER): Admitting: Nurse Practitioner

## 2024-08-14 VITALS — BP 100/60 | HR 61 | Temp 97.8°F | Ht 64.0 in | Wt 155.6 lb

## 2024-08-14 DIAGNOSIS — R7989 Other specified abnormal findings of blood chemistry: Secondary | ICD-10-CM

## 2024-08-14 DIAGNOSIS — R410 Disorientation, unspecified: Secondary | ICD-10-CM | POA: Diagnosis not present

## 2024-08-14 LAB — URINALYSIS, ROUTINE W REFLEX MICROSCOPIC
Bilirubin Urine: NEGATIVE
Ketones, ur: NEGATIVE
Leukocytes,Ua: NEGATIVE
Nitrite: NEGATIVE
Specific Gravity, Urine: 1.01 (ref 1.000–1.030)
Total Protein, Urine: NEGATIVE
Urine Glucose: NEGATIVE
Urobilinogen, UA: 0.2 (ref 0.0–1.0)
pH: 6.5 (ref 5.0–8.0)

## 2024-08-14 LAB — CBC
HCT: 31.3 % — ABNORMAL LOW (ref 36.0–46.0)
Hemoglobin: 10.5 g/dL — ABNORMAL LOW (ref 12.0–15.0)
MCHC: 33.5 g/dL (ref 30.0–36.0)
MCV: 101 fl — ABNORMAL HIGH (ref 78.0–100.0)
Platelets: 79 K/uL — ABNORMAL LOW (ref 150.0–400.0)
RBC: 3.1 Mil/uL — ABNORMAL LOW (ref 3.87–5.11)
RDW: 19 % — ABNORMAL HIGH (ref 11.5–15.5)
WBC: 2.3 K/uL — ABNORMAL LOW (ref 4.0–10.5)

## 2024-08-14 LAB — COMPREHENSIVE METABOLIC PANEL WITH GFR
ALT: 44 U/L — ABNORMAL HIGH (ref 0–35)
AST: 72 U/L — ABNORMAL HIGH (ref 0–37)
Albumin: 3.3 g/dL — ABNORMAL LOW (ref 3.5–5.2)
Alkaline Phosphatase: 157 U/L — ABNORMAL HIGH (ref 39–117)
BUN: 23 mg/dL (ref 6–23)
CO2: 28 meq/L (ref 19–32)
Calcium: 9.4 mg/dL (ref 8.4–10.5)
Chloride: 105 meq/L (ref 96–112)
Creatinine, Ser: 1.36 mg/dL — ABNORMAL HIGH (ref 0.40–1.20)
GFR: 38.72 mL/min — ABNORMAL LOW (ref >60.00)
Glucose, Bld: 99 mg/dL (ref 70–99)
Potassium: 4.2 meq/L (ref 3.5–5.1)
Sodium: 139 meq/L (ref 135–145)
Total Bilirubin: 1.3 mg/dL — ABNORMAL HIGH (ref 0.2–1.2)
Total Protein: 6.3 g/dL (ref 6.0–8.3)

## 2024-08-14 LAB — AMMONIA: Ammonia: 124 umol/L — ABNORMAL HIGH (ref 11–35)

## 2024-08-14 LAB — TSH: TSH: 0.31 u[IU]/mL — ABNORMAL LOW (ref 0.35–5.50)

## 2024-08-14 NOTE — Assessment & Plan Note (Signed)
 Longstanding history of liver failure with encephalopathy.  Patient also has an extensive history with urinary tract infections.  Will check CBC CMP ammonia TSH and urine today.  Patient does have a history of hyperthermia.  With abnormal TSH.  Patient currently taking namebrand Synthroid  75 mcg daily.  Continue lactulose  getting up to 2 bowel movements a day.  Continue drinking plenty of fluids.

## 2024-08-14 NOTE — Progress Notes (Signed)
 Established Patient Office Visit  Subjective   Patient ID: [name], female    DOB: 04/17/1951  Age: 73 y.o. MRN: [id number]  Chief Complaint  Patient presents with   Follow-up    Pt complains of low temp readings (temps have been lower than 97 for past week) and states that on Friday she took a hard nap 2-3 hours which they are concerned about. Requests for labs and medication management. Not sure if she is maintaining temps on her own.     HPI  Discussed the use of AI scribe software for clinical note transcription with the patient, who gave verbal consent to proceed.  History of Present Illness [name] is a 73 year old female with hepatic encephalopathy who presents with altered mental status and fatigue.  Symptoms began approximately three to four days ago, coinciding with a reduction in lactulose  intake from four times a day to three, resulting in only one large bowel movement per day instead of the usual two or three. She experienced significant fatigue, including an episode of falling asleep for three hours in an unusual position, raising concerns about her alertness and energy levels.  She describes feeling 'blah', with slower walking and responding, and noted a drop in body temperature to 97 degrees, which was unusual for her. She has been drinking more fluids, leading to increased urination, but no pain with urination. In response to these symptoms, she increased her lactulose  back to four times a day, resulting in more frequent bowel movements, described as 'airy' and 'oozy'. She had a significant bowel movement last night and feels somewhat improved today, though still slightly off from her baseline.  Her caregiver expressed concern about the potential for encephalopathy and noted recent changes in her bowel regimen and mental status. She has been more active recently, cooking meals and engaging in activities, which may contribute to her fatigue. She also  reports a sore on her leg that has not healed completely, for which she has been applying Neosporin. There are two sores.  Her voice has been softer and more 'squeaky', but no drainage or heartburn. She has been oriented and alert, able to engage in conversation and humor.     Review of Systems  Constitutional:  Positive for malaise/fatigue. Negative for chills and fever.  Respiratory:  Negative for shortness of breath.   Cardiovascular:  Negative for chest pain.  Neurological:  Positive for tremors. Negative for headaches.       + confusion       Objective:     BP 100/60   Pulse 61   Temp 97.8 F (36.6 C) (Oral)   Ht 5' 4 (1.626 m)   Wt 155 lb 9.6 oz (70.6 kg)   SpO2 95%   BMI 26.71 kg/m  BP Readings from Last 3 Encounters:  08/14/24 100/60  08/02/24 112/72  07/31/24 (!) 90/58   Wt Readings from Last 3 Encounters:  08/14/24 155 lb 9.6 oz (70.6 kg)  08/02/24 157 lb 3.2 oz (71.3 kg)  07/31/24 156 lb 6 oz (70.9 kg)   SpO2 Readings from Last 3 Encounters:  08/14/24 95%  08/02/24 100%  07/31/24 98%      Physical Exam Vitals and nursing note reviewed.  Constitutional:      Appearance: Normal appearance.  Cardiovascular:     Rate and Rhythm: Normal rate and regular rhythm.     Heart sounds: Normal heart sounds.  Pulmonary:     Effort: Pulmonary  effort is normal.     Breath sounds: Normal breath sounds.  Musculoskeletal:     Right lower leg: Edema present.     Left lower leg: Edema present.  Neurological:     General: No focal deficit present.     Mental Status: She is alert.     Deep Tendon Reflexes:     Reflex Scores:      Bicep reflexes are 2+ on the right side and 2+ on the left side.      Patellar reflexes are 1+ on the right side and 1+ on the left side.    Comments: Bilateral upper and lower extremity strength 5/5  No asterixis      No results found for any visits on 08/14/24.    The ASCVD Risk score (Arnett DK, et al., 2019) failed to  calculate for the following reasons:   Risk score cannot be calculated because patient has a medical history suggesting prior/existing ASCVD    Assessment & Plan:   Problem List Items Addressed This Visit       Nervous and Auditory   Confusion - Primary   Longstanding history of liver failure with encephalopathy.  Patient also has an extensive history with urinary tract infections.  Will check CBC CMP ammonia TSH and urine today.  Patient does have a history of hyperthermia.  With abnormal TSH.  Patient currently taking namebrand Synthroid  75 mcg daily.  Continue lactulose  getting up to 2 bowel movements a day.  Continue drinking plenty of fluids.      Relevant Orders   CBC   Comprehensive metabolic panel with GFR   Ammonia   TSH   Urinalysis, Routine w reflex microscopic    Return for As scheduled.    [name], NP

## 2024-08-14 NOTE — Patient Instructions (Signed)
 Nice to see you today  I will be in touch with the labs once I have them  Follow up as scheduled, sooner if you need me

## 2024-08-16 ENCOUNTER — Ambulatory Visit (INDEPENDENT_AMBULATORY_CARE_PROVIDER_SITE_OTHER)

## 2024-08-16 ENCOUNTER — Ambulatory Visit: Payer: Self-pay | Admitting: Nurse Practitioner

## 2024-08-16 DIAGNOSIS — E039 Hypothyroidism, unspecified: Secondary | ICD-10-CM

## 2024-08-16 DIAGNOSIS — R7989 Other specified abnormal findings of blood chemistry: Secondary | ICD-10-CM

## 2024-08-16 LAB — T3, FREE: T3, Free: 2.7 pg/mL (ref 2.3–4.2)

## 2024-08-16 LAB — T4, FREE: Free T4: 1.37 ng/dL (ref 0.60–1.60)

## 2024-08-16 NOTE — Addendum Note (Signed)
 Addended by: ISADORA RAISIN on: 08/16/2024 08:33 AM   Modules accepted: Orders

## 2024-08-17 MED ORDER — SYNTHROID 75 MCG PO TABS: 75.0000 ug | ORAL_TABLET | Freq: Every day | ORAL | 0 refills | Status: DC

## 2024-08-21 ENCOUNTER — Ambulatory Visit: Admitting: Physical Therapy

## 2024-08-24 ENCOUNTER — Ambulatory Visit: Attending: Nurse Practitioner | Admitting: Physical Therapy

## 2024-08-24 DIAGNOSIS — R278 Other lack of coordination: Secondary | ICD-10-CM | POA: Diagnosis present

## 2024-08-24 DIAGNOSIS — R5381 Other malaise: Secondary | ICD-10-CM | POA: Insufficient documentation

## 2024-08-24 DIAGNOSIS — M6281 Muscle weakness (generalized): Secondary | ICD-10-CM | POA: Diagnosis present

## 2024-08-24 DIAGNOSIS — R262 Difficulty in walking, not elsewhere classified: Secondary | ICD-10-CM | POA: Insufficient documentation

## 2024-08-24 DIAGNOSIS — R2681 Unsteadiness on feet: Secondary | ICD-10-CM | POA: Insufficient documentation

## 2024-08-24 DIAGNOSIS — R2689 Other abnormalities of gait and mobility: Secondary | ICD-10-CM | POA: Diagnosis present

## 2024-08-24 NOTE — Therapy (Signed)
 OUTPATIENT PHYSICAL THERAPY NEURO TREATMENT NOTE   Patient Name: [name] MRN: [id number] DOB:09-Oct-1951, 73 y.o., female Today's Date: 08/25/2024   PCP: [name], NP REFERRING PROVIDER: [name], NP  END OF SESSION:  PT End of Session - 08/27/24 1102     Visit Number 2    Number of Visits 17    Date for Recertification  10/06/24    Authorization Type [employer] - Auth. required    Progress Note Due on Visit 10    PT Start Time 1447    PT Stop Time 1530    PT Time Calculation (min) 43 min    Equipment Utilized During Treatment Gait belt    Activity Tolerance Patient tolerated treatment well    Behavior During Therapy [facility] for tasks assessed/performed           Past Medical History:  Diagnosis Date   Acute hepatic encephalopathy (HCC) 03/30/2024   Acute urinary retention 05/04/2022   Allergy 2006 ?   Contrast dye   Arthritis 2016 ??   Knees and thumb   Cancer [facility])    cecum   Cataract 2021   Surgery scheduled July 2023   Colon cancer [facility]) 2003   Elevated liver function tests    Esophageal varices (HCC)    Heart murmur On file   Hemorrhage of gastrointestinal tract 05/04/2011   Hepatic encephalopathy (HCC) 2025   Hypertension 2021   Hypothyroidism    Iron deficiency anemia    Liver disease    chemotherapy complication, per pt, shunts placed to bypass liver   Malignant neoplasm of cecum (HCC)    Portal hypertension (HCC)    Skin cancer 2019   Splenomegaly    Past Surgical History:  Procedure Laterality Date   COLON SURGERY  2004   Cancer   COSMETIC SURGERY  2021   Skin cancer   ESOPHAGEAL VARICE LIGATION     ESOPHAGOGASTRODUODENOSCOPY N/A 05/28/2024   Procedure: EGD (ESOPHAGOGASTRODUODENOSCOPY);  Surgeon: Albertus Gordy HERO, MD;  Location: [facility];  Service: Gastroenterology;  Laterality: N/A;   EYE SURGERY     HEMICOLECTOMY  01/08/2003   IR RADIOLOGIST EVAL & MGMT  12/20/2020   IR RADIOLOGIST EVAL & MGMT  05/29/2021   LIVER  SURGERY     shunts placed after chemo complication   SKIN FULL THICKNESS GRAFT N/A 09/12/2019   Procedure: debridement and FTSG to the nose from left upper arm;  Surgeon: Elisabeth Craig RAMAN, MD;  Location: [facility];  Service: Plastics;  Laterality: N/A;  2 hours, please   TIPS PROCEDURE     Patient Active Problem List   Diagnosis Date Noted   AMS (altered mental status) 07/01/2024   Heme positive stool 05/28/2024   Acute gastric ulcer without hemorrhage or perforation 05/28/2024   Gastritis and gastroduodenitis 05/28/2024   Acute on chronic anemia 05/27/2024   SIRS (systemic inflammatory response syndrome) (HCC) 05/21/2024   Acute prerenal azotemia 05/21/2024   Paroxysmal atrial fibrillation (HCC) 05/21/2024   Acute metabolic encephalopathy 05/20/2024   Vision disturbance 05/11/2024   Patent foramen ovale 04/16/2024   Acute CVA (cerebrovascular accident) (HCC) 04/15/2024   Abnormal CBC 04/10/2024   Hyperkalemia 04/05/2024   Hypoglycemia 04/05/2024   Macrocytic anemia 04/05/2024   Cerebrovascular accident (CVA) (HCC) 03/16/2024   History of stroke 01/06/2024   Thrombocytopenia 12/21/2023   Decreased GFR 12/10/2023   Preventative health care 09/09/2023   Lower extremity edema 08/10/2023   Cellulitis of both  lower extremities 08/04/2023   Altered mental status 08/04/2023   Bradycardia 05/07/2023   Protein-calorie malnutrition, severe 02/26/2023   Weight loss 02/24/2023   Other constipation 02/24/2023   Generalized abdominal pain 02/24/2023   Hospital discharge follow-up 02/24/2023   Weakness 01/04/2023   Confusion 08/26/2022   Frequent UTI 05/18/2022   Anemia of chronic disease 05/18/2022   AKI (acute kidney injury) 04/30/2022   Tachycardia-bradycardia syndrome (HCC) 02/05/2022   Hypomagnesemia 02/04/2022   Hepatic encephalopathy (HCC) 02/03/2022   Basal cell carcinoma (BCC) 05/06/2021   Hypertension 05/06/2021   Cirrhosis (HCC) 12/04/2020   Murmur, cardiac  03/19/2020   Acquired hypothyroidism 06/30/2019   History of basal cell cancer 06/30/2019   Hx of colon cancer, stage III 11/30/2011   Portal hypertension (HCC) 01/23/2008    ONSET DATE: 07-01-24 for most recent hospitalization  REFERRING DIAG:  Diagnosis  K76.82 (ICD-10-CM) - Hepatic encephalopathy (HCC)  R53.1 (ICD-10-CM) - Weakness    THERAPY DIAG:  Other abnormalities of gait and mobility  Muscle weakness (generalized)  Unsteadiness on feet  Rationale for Evaluation and Treatment: Rehabilitation  SUBJECTIVE:                                                                                                                                                                                             SUBJECTIVE STATEMENT: Pt reports she came for PT appt on Monday this week but was told it had been cancelled (PT was out of office) - pt states she did not get the message.  Husband reports they decided to go play putt-putt since they were already out of the house and pt was feeling good that day.  He states pt was able to amb. On putt-putt course without too much difficulty - says they had a great time.  Pt reports she was tired afterwards but it was a good day.  Pt reports doing the 2 exercises given for HEP. Pt accompanied by: spouse, [name]  PERTINENT HISTORY:  Per chart note 07-01-24; past medical history significant for cirrhosis, prior hepatic encephalopathy, acquired hypothyroidism, presented to hospital with altered mental status and elevated ammonia level for 5 days with generalized weakness.  hospitalized 07-01-24 - 07-03-24 with hepatic encephalopathy PMHx of  Stroke Hypothyroidism Cecal adenocarcinoma, s/p resection and chemotherapy in 2004 Liver disease, cirrhosis,  Skin Cancer  Hypertension  PAIN:  Are you having pain? No  PRECAUTIONS: Fall  RED FLAGS: None   WEIGHT BEARING RESTRICTIONS: No  FALLS: Has patient fallen in last 6 months? No  LIVING  ENVIRONMENT: Lives with: lives with their spouse Lives in: House/apartment Stairs: No Has following equipment  at home: Ramped entry; SPC, RW, shower chair, manual wheelchair, BSC  PLOF: Independent with basic ADLs, Independent with household mobility with device, Independent with transfers, and Needs assistance with homemaking  PATIENT GOALS: walk without my cane; increase energy   OBJECTIVE:  Note: Objective measures were completed at Evaluation unless otherwise noted.  DIAGNOSTIC FINDINGS: CT scan 06-19-24 IMPRESSION: 1. No evidence of acute intracranial abnormality. 2. Similar prior left PCA territory infarct and chronic microvascular ischemic change.  COGNITION: Overall cognitive status: Within functional limits for tasks assessed   SENSATION: Not tested  COORDINATION: WFL's bil. LE's    POSTURE: rounded shoulders, forward head, and increased thoracic kyphosis  LOWER EXTREMITY ROM:   WFL's bil. LE's   LOWER EXTREMITY MMT:    MMT Right Eval Left Eval  Hip flexion 4 4  Hip extension    Hip abduction    Hip adduction    Hip internal rotation    Hip external rotation    Knee flexion 4 4  Knee extension 5 5  Ankle dorsiflexion 5 5  Ankle plantarflexion    Ankle inversion    Ankle eversion    (Blank rows = not tested)  BED MOBILITY:  Not tested =pt reports independent with bed mobility  TRANSFERS: Sit to stand: Modified independence  Assistive device utilized: bil. Armrests of chair     Stand to sit: Modified independence  Assistive device utilized: bil. Armrests of chair      RAMP:  Not tested  CURB:  Not tested  STAIRS: Not tested GAIT: Findings: Gait Characteristics: step through pattern, decreased step length- Right, and decreased step length- Left, Distance walked: 75', Assistive device utilized:Single point cane, Level of assistance: CGA, and Comments: SPC with small quad tip used during eval; pt reports she is beginning to ambulate very short  distances in home without use of SPC; slow gait speed   FUNCTIONAL TESTS:  5 times sit to stand: 35.88 secs without UE support - from chair Timed up and go (TUG): 19.38 secs with SPC 10 meter walk test:  20.65 secs = 1.59 ft/sec                                                                                                                                TREATMENT:    08-24-24  Gait:  Pt amb. with SPC with SBA to PT appt Pt amb. 1 nonstop with SPC with SBA on flat, even surface (1 lap - 115') around track   NeuroRe-ed:    08/24/24 0001  Berg Balance Test  Sit to Stand 3  Standing Unsupported 3  Sitting with Back Unsupported but Feet Supported on Floor or Stool 4  Stand to Sit 4  Transfers 3  Standing Unsupported with Eyes Closed 3  Standing Unsupported with Feet Together 3  From Standing, Reach Forward with Outstretched Arm 4  From Standing Position, Pick up Object from Floor 4  From Standing  Position, Turn to Look Behind Over each Shoulder 4  Turn 360 Degrees 2 (L= 10.8   R= 9.13)  Standing Unsupported, Alternately Place Feet on Step/Stool 2  Standing Unsupported, One Foot in Front 2  Standing on One Leg 1  Total Score 42    Added balance exercises to previous HEP: bolded exercises below added to HEP; pt performed these exercises at counter with UE support prn for assist with balance  Access Code: [email or web address] URL: [email or web address] Date: 08/27/2024 Prepared by: Rock Kussmaul  Exercises - Sit to Stand  - 1 x daily - 7 x weekly - 1 sets - 10 reps - Standing March with Counter Support  - 1 x daily - 7 x weekly - 1 sets - 10 reps - Standing Hip Flexion with Counter Support  - 1 x daily - 7 x weekly - 1 sets - 10 reps - Standing Hip Extension with Unilateral Counter Support  - 1 x daily - 7 x weekly - 1 sets - 10 reps - Standing Hip Abduction with Counter Support  - 1 x daily - 7 x weekly - 1 sets - 10 reps - Sideways Walking  - 1 x daily - 7 x weekly - 1  sets - 2-3 reps - Single Limb Stance - 2 x daily - 7 x weekly - 1 set- 2-3 reps  TherEx:  Pt performed 5x sit to stand from mat with/without UE support for LE strengthening - mod. Difficulty with this exercise; pt used RUE support on reps 1 & 2  Medbridge  Access Code: [email or web address] URL: [email or web address] Date: 08/09/2024 Prepared by: Rock Kussmaul  Exercises - Sit to Stand  - 1 x daily - 7 x weekly - 1 sets - 10 reps - Standing March with Counter Support  - 1 x daily - 7 x weekly - 1 sets - 10 reps DATE: 08-08-24   PATIENT EDUCATION: Education details:  additional standing balance exercises added to HEP - Medbridge  [email or web address] Person educated: Patient and Spouse Education method: Explanation, Demonstration, and Handouts Education comprehension: verbalized understanding and needs further education  HOME EXERCISE PROGRAM: See above - Medbridge [email or web address]  GOALS: Goals reviewed with patient? Yes  SHORT TERM GOALS: Target date: 09-08-24  Improve 5x sit to stand transfer score to </= 28 secs without UE support from chair to demo improved LE strength.  Baseline:  5 times sit to stand: 35.88 secs without UE support - from chair Goal status: INITIAL  2.  Improve TUG score to </= 16 secs with SPC to demonstrate reduced fall risk. Baseline: 19.38 secs with SPC Goal status: INITIAL  3.  Increase gait velocity to >/= 1.9 ft/sec with use of SPC for increased gait efficiency. Baseline:  10 meter walk test:  20.65 secs = 1.59 ft/sec with SPC  Goal status: INITIAL  4.  Ambulate 49' without use of SPC with supervision for household ambulation.  Baseline:  Goal status: INITIAL  5.   Amb. 2 nonstop with use of SPC with SBA to CGA to demonstrate improved endurance/activity tolerance. Baseline:  Goal status: INITIAL  6.  Independent in HEP for balance and LE strengthening.  Baseline:  Goal status: INITIAL    LONG TERM GOALS: Target date: 10-06-24  Improve 5x sit to  stand transfer score to </= 22 secs without UE support from chair to demo improved LE strength.  Baseline:  5 times sit to stand: 35.88 secs without UE support - from chair Goal status: INITIAL  2.  Improve TUG score to </= 13.5 secs with SPC to demonstrate reduced fall risk. Baseline: 19.38 secs with SPC Goal status: INITIAL  3.  Increase Berg balance test score by at least 5 points to demo improved balance and decreased fall risk.  Baseline: TBA Goal status: INITIAL  4.  Pt will report modified independent household ambulation without use of SPC. Baseline:  Goal status: INITIAL  5.  Increase gait velocity to >/= 2.4 ft/sec with use of SPC for increased gait efficiency. Baseline:  10 meter walk test:  20.65 secs = 1.59 ft/sec with SPC  Goal status: INITIAL  6.  Amb. 4 nonstop with use of SPC with SBA for increased community accessibility.  Baseline:  Goal status: INITIAL  ASSESSMENT:  CLINICAL IMPRESSION: PT session focused on further balance assessment with pt scoring 42/56 on Berg balance test, indicative of fall risk as score < 45/56.  Session also focused on HEP instruction with balance exercises added to HEP.  Pt able to amb. 1 nonstop with use of SPC in today's session, demonstrating improved endurance/activity tolerance since initial eval on 08-08-24.  Pt continues to have some difficulty with sit to stand transfers without UE support due to bil. Hip musc. Weakness.  Cont with POC.  OBJECTIVE IMPAIRMENTS: decreased activity tolerance, decreased balance, decreased endurance, and difficulty walking.   ACTIVITY LIMITATIONS: carrying, lifting, bending, squatting, stairs, and locomotion level  PARTICIPATION LIMITATIONS: meal prep, cleaning, laundry, driving, shopping, and community activity  PERSONAL FACTORS: Behavior pattern, Past/current experiences, Time since onset of injury/illness/exacerbation, and 1-2 comorbidities: h/o hepatic encephalopathy with multiple  hospitalizations and h/o CVA are also affecting patient's functional outcome.   REHAB POTENTIAL: Good  CLINICAL DECISION MAKING: Evolving/moderate complexity  EVALUATION COMPLEXITY: Moderate  PLAN:  PT FREQUENCY: 2x/week  PT DURATION: 8 weeks  PLANNED INTERVENTIONS: 97110-Therapeutic exercises, 97530- Therapeutic activity, 97112- Neuromuscular re-education, [id number]- Self Care, [id number]- Gait training, and Patient/Family education  PLAN FOR NEXT SESSION: check new exercises (standing balance) added to HEP for any ?'s/problems; continue dynamic gait training, endurance training; [employer]   Jett Kulzer, [place], PT 08/25/2024, 11:05 AM

## 2024-08-27 ENCOUNTER — Encounter: Payer: Self-pay | Admitting: Physical Therapy

## 2024-08-27 NOTE — Progress Notes (Signed)
   08/24/24 0001  Berg Balance Test  Sit to Stand 3  Standing Unsupported 3  Sitting with Back Unsupported but Feet Supported on Floor or Stool 4  Stand to Sit 4  Transfers 3  Standing Unsupported with Eyes Closed 3  Standing Unsupported with Feet Together 3  From Standing, Reach Forward with Outstretched Arm 4  From Standing Position, Pick up Object from Floor 4  From Standing Position, Turn to Look Behind Over each Shoulder 4  Turn 360 Degrees 2 (L= 10.8   R= 9.13)  Standing Unsupported, Alternately Place Feet on Step/Stool 2  Standing Unsupported, One Foot in Front 2  Standing on One Leg 1  Total Score 42

## 2024-08-28 ENCOUNTER — Ambulatory Visit

## 2024-08-28 VITALS — BP 129/65 | HR 57

## 2024-08-28 DIAGNOSIS — R2681 Unsteadiness on feet: Secondary | ICD-10-CM

## 2024-08-28 DIAGNOSIS — R262 Difficulty in walking, not elsewhere classified: Secondary | ICD-10-CM

## 2024-08-28 DIAGNOSIS — M6281 Muscle weakness (generalized): Secondary | ICD-10-CM

## 2024-08-28 DIAGNOSIS — R278 Other lack of coordination: Secondary | ICD-10-CM

## 2024-08-28 DIAGNOSIS — R2689 Other abnormalities of gait and mobility: Secondary | ICD-10-CM | POA: Diagnosis not present

## 2024-08-28 NOTE — Therapy (Unsigned)
 OUTPATIENT PHYSICAL THERAPY NEURO TREATMENT NOTE   Patient Name: [name] MRN: [id number] DOB:12/23/1950, 73 y.o., female Today's Date: 08/25/2024   PCP: [name], NP REFERRING PROVIDER: [name], NP  END OF SESSION:  PT End of Session - 08/28/24 1453     Visit Number 3    Number of Visits 17    Date for Recertification  10/06/24    Authorization Type [employer] Medicare - Auth. required    Progress Note Due on Visit 10    PT Start Time 1445    PT Stop Time 1529    PT Time Calculation (min) 44 min    Activity Tolerance Patient tolerated treatment well;Patient limited by fatigue    Behavior During Therapy [facility] for tasks assessed/performed           Past Medical History:  Diagnosis Date   Acute hepatic encephalopathy (HCC) 03/30/2024   Acute urinary retention 05/04/2022   Allergy 2006 ?   Contrast dye   Arthritis 2016 ??   Knees and thumb   Cancer [facility])    cecum   Cataract 2021   Surgery scheduled July 2023   Colon cancer [facility]) 2003   Elevated liver function tests    Esophageal varices (HCC)    Heart murmur On file   Hemorrhage of gastrointestinal tract 05/04/2011   Hepatic encephalopathy (HCC) 2025   Hypertension 2021   Hypothyroidism    Iron deficiency anemia    Liver disease    chemotherapy complication, per pt, shunts placed to bypass liver   Malignant neoplasm of cecum (HCC)    Portal hypertension (HCC)    Skin cancer 2019   Splenomegaly    Past Surgical History:  Procedure Laterality Date   COLON SURGERY  2004   Cancer   COSMETIC SURGERY  2021   Skin cancer   ESOPHAGEAL VARICE LIGATION     ESOPHAGOGASTRODUODENOSCOPY N/A 05/28/2024   Procedure: EGD (ESOPHAGOGASTRODUODENOSCOPY);  Surgeon: Albertus Gordy HERO, MD;  Location: [facility];  Service: Gastroenterology;  Laterality: N/A;   EYE SURGERY     HEMICOLECTOMY  01/08/2003   IR RADIOLOGIST EVAL & MGMT  12/20/2020   IR RADIOLOGIST EVAL & MGMT  05/29/2021   LIVER SURGERY     shunts placed  after chemo complication   SKIN FULL THICKNESS GRAFT N/A 09/12/2019   Procedure: debridement and FTSG to the nose from left upper arm;  Surgeon: Elisabeth Craig RAMAN, MD;  Location: [facility];  Service: Plastics;  Laterality: N/A;  2 hours, please   TIPS PROCEDURE     Patient Active Problem List   Diagnosis Date Noted   AMS (altered mental status) 07/01/2024   Heme positive stool 05/28/2024   Acute gastric ulcer without hemorrhage or perforation 05/28/2024   Gastritis and gastroduodenitis 05/28/2024   Acute on chronic anemia 05/27/2024   SIRS (systemic inflammatory response syndrome) (HCC) 05/21/2024   Acute prerenal azotemia 05/21/2024   Paroxysmal atrial fibrillation (HCC) 05/21/2024   Acute metabolic encephalopathy 05/20/2024   Vision disturbance 05/11/2024   Patent foramen ovale 04/16/2024   Acute CVA (cerebrovascular accident) (HCC) 04/15/2024   Abnormal CBC 04/10/2024   Hyperkalemia 04/05/2024   Hypoglycemia 04/05/2024   Macrocytic anemia 04/05/2024   Cerebrovascular accident (CVA) (HCC) 03/16/2024   History of stroke 01/06/2024   Thrombocytopenia 12/21/2023   Decreased GFR 12/10/2023   Preventative health care 09/09/2023   Lower extremity edema 08/10/2023   Cellulitis of both lower extremities 08/04/2023   Altered  mental status 08/04/2023   Bradycardia 05/07/2023   Protein-calorie malnutrition, severe 02/26/2023   Weight loss 02/24/2023   Other constipation 02/24/2023   Generalized abdominal pain 02/24/2023   Hospital discharge follow-up 02/24/2023   Weakness 01/04/2023   Confusion 08/26/2022   Frequent UTI 05/18/2022   Anemia of chronic disease 05/18/2022   AKI (acute kidney injury) 04/30/2022   Tachycardia-bradycardia syndrome (HCC) 02/05/2022   Hypomagnesemia 02/04/2022   Hepatic encephalopathy (HCC) 02/03/2022   Basal cell carcinoma (BCC) 05/06/2021   Hypertension 05/06/2021   Cirrhosis (HCC) 12/04/2020   Murmur, cardiac 03/19/2020   Acquired  hypothyroidism 06/30/2019   History of basal cell cancer 06/30/2019   Hx of colon cancer, stage III 11/30/2011   Portal hypertension (HCC) 01/23/2008    ONSET DATE: 07-01-24 for most recent hospitalization  REFERRING DIAG:  Diagnosis  K76.82 (ICD-10-CM) - Hepatic encephalopathy (HCC)  R53.1 (ICD-10-CM) - Weakness    THERAPY DIAG:  Other abnormalities of gait and mobility  Muscle weakness (generalized)  Unsteadiness on feet  Difficulty in walking, not elsewhere classified  Other lack of coordination  Rationale for Evaluation and Treatment: Rehabilitation  SUBJECTIVE:                                                                                                                                                                                             SUBJECTIVE STATEMENT: Patient arrives to clinic with husband and SPC. Denies falls, but endorses significant soreness, primarily in R hip, as high as 8/10 when ambulating. Denies significant losses of balance. Had a busy week last week and feels as though it's due to this.  Pt accompanied by: spouse, [name]  PERTINENT HISTORY:  Per chart note 07-01-24; past medical history significant for cirrhosis, prior hepatic encephalopathy, acquired hypothyroidism, presented to hospital with altered mental status and elevated ammonia level for 5 days with generalized weakness.  hospitalized 07-01-24 - 07-03-24 with hepatic encephalopathy PMHx of  Stroke Hypothyroidism Cecal adenocarcinoma, s/p resection and chemotherapy in 2004 Liver disease, cirrhosis,  Skin Cancer  Hypertension  PAIN:  Are you having pain? No  PRECAUTIONS: Fall   PATIENT GOALS: walk without my cane; increase energy   OBJECTIVE:  Note: Objective measures were completed at Evaluation unless otherwise noted.  DIAGNOSTIC FINDINGS: CT scan 06-19-24 IMPRESSION: 1. No evidence of acute intracranial abnormality. 2. Similar prior left PCA territory infarct and  chronic microvascular ischemic change.  TREATMENT:    Therex: -scifit hills level 1 x10 mins B UE/LE for dynamic warm up and general conditioning  -demonstrated SKTC, supine piriformis stretch and supine adductor stretch per husband request   PATIENT EDUCATION: Education details:  continue HEP Person educated: Patient and Spouse Education method: Explanation, Demonstration, and Handouts Education comprehension: verbalized understanding and needs further education  HOME EXERCISE PROGRAM: See above - Medbridge [email or web address]  GOALS: Goals reviewed with patient? Yes  SHORT TERM GOALS: Target date: 09-08-24  Improve 5x sit to stand transfer score to </= 28 secs without UE support from chair to demo improved LE strength.  Baseline:  5 times sit to stand: 35.88 secs without UE support - from chair Goal status: INITIAL  2.  Improve TUG score to </= 16 secs with SPC to demonstrate reduced fall risk. Baseline: 19.38 secs with SPC Goal status: INITIAL  3.  Increase gait velocity to >/= 1.9 ft/sec with use of SPC for increased gait efficiency. Baseline:  10 meter walk test:  20.65 secs = 1.59 ft/sec with SPC  Goal status: INITIAL  4.  Ambulate 27' without use of SPC with supervision for household ambulation.  Baseline:  Goal status: INITIAL  5.   Amb. 2 nonstop with use of SPC with SBA to CGA to demonstrate improved endurance/activity tolerance. Baseline:  Goal status: INITIAL  6.  Independent in HEP for balance and LE strengthening.  Baseline:  Goal status: INITIAL    LONG TERM GOALS: Target date: 10-06-24  Improve 5x sit to stand transfer score to </= 22 secs without UE support from chair to demo improved LE strength.  Baseline:  5 times sit to stand: 35.88 secs without UE support - from chair Goal status: INITIAL  2.  Improve TUG score to </= 13.5  secs with SPC to demonstrate reduced fall risk. Baseline: 19.38 secs with SPC Goal status: INITIAL  3.  Increase Berg balance test score by at least 5 points to demo improved balance and decreased fall risk.  Baseline: TBA Goal status: INITIAL  4.  Pt will report modified independent household ambulation without use of SPC. Baseline:  Goal status: INITIAL  5.  Increase gait velocity to >/= 2.4 ft/sec with use of SPC for increased gait efficiency. Baseline:  10 meter walk test:  20.65 secs = 1.59 ft/sec with SPC  Goal status: INITIAL  6.  Amb. 4 nonstop with use of SPC with SBA for increased community accessibility.  Baseline:  Goal status: INITIAL  ASSESSMENT:  CLINICAL IMPRESSION: Patient seen for skilled PT session with emphasis on general strengthening. Patient limited in endurance, requiring multiple rest breaks due to subjective tachycardia and SOB. Husband requesting to be shown hip stretches due to patient report of pain in her R hip. Denied needing print outs of stretches. Continue POC.   OBJECTIVE IMPAIRMENTS: decreased activity tolerance, decreased balance, decreased endurance, and difficulty walking.   ACTIVITY LIMITATIONS: carrying, lifting, bending, squatting, stairs, and locomotion level  PARTICIPATION LIMITATIONS: meal prep, cleaning, laundry, driving, shopping, and community activity  PERSONAL FACTORS: Behavior pattern, Past/current experiences, Time since onset of injury/illness/exacerbation, and 1-2 comorbidities: h/o hepatic encephalopathy with multiple hospitalizations and h/o CVA are also affecting patient's functional outcome.   REHAB POTENTIAL: Good  CLINICAL DECISION MAKING: Evolving/moderate complexity  EVALUATION COMPLEXITY: Moderate  PLAN:  PT FREQUENCY: 2x/week  PT DURATION: 8 weeks  PLANNED INTERVENTIONS: 97110-Therapeutic exercises, 97530- Therapeutic activity, [id number]- Neuromuscular re-education, [phone]- Self Care, [id number]- Gait training, and  Patient/Family education  PLAN  FOR NEXT SESSION: check new exercises (standing balance) added to HEP for any ?'s/problems; continue dynamic gait training, endurance training; SciFit   Delon DELENA Pop, PT Delon DELENA Pop, PT, DPT, CBIS  08/25/2024, 3:31 PM

## 2024-08-31 ENCOUNTER — Encounter: Payer: Self-pay | Admitting: Physical Therapy

## 2024-08-31 ENCOUNTER — Ambulatory Visit: Admitting: Physical Therapy

## 2024-08-31 DIAGNOSIS — R2689 Other abnormalities of gait and mobility: Secondary | ICD-10-CM | POA: Diagnosis not present

## 2024-08-31 DIAGNOSIS — M6281 Muscle weakness (generalized): Secondary | ICD-10-CM

## 2024-08-31 DIAGNOSIS — R262 Difficulty in walking, not elsewhere classified: Secondary | ICD-10-CM

## 2024-08-31 DIAGNOSIS — R2681 Unsteadiness on feet: Secondary | ICD-10-CM

## 2024-08-31 NOTE — Therapy (Signed)
 OUTPATIENT PHYSICAL THERAPY NEURO TREATMENT NOTE   Patient Name: [name] MRN: [id number] DOB:September 29, 1951, 73 y.o., female Today's Date: 08/31/2024   PCP: [name], NP REFERRING PROVIDER: [name], NP  END OF SESSION:  PT End of Session - 08/31/24 1600     Visit Number 4    Number of Visits 17    Date for Recertification  10/06/24    Authorization Type [employer] Medicare - Auth. required    Authorization Time Period 08-08-24 - 10-03-24    Authorization - Visit Number 4    Authorization - Number of Visits 17    Progress Note Due on Visit 10    Activity Tolerance Patient limited by pain;Other (comment)   c/o Rt hip pain which appears to be consistent with bursitis   Behavior During Therapy [facility] for tasks assessed/performed           Past Medical History:  Diagnosis Date   Acute hepatic encephalopathy (HCC) 03/30/2024   Acute urinary retention 05/04/2022   Allergy 2006 ?   Contrast dye   Arthritis 2016 ??   Knees and thumb   Cancer [facility])    cecum   Cataract 2021   Surgery scheduled July 2023   Colon cancer [facility]) 2003   Elevated liver function tests    Esophageal varices (HCC)    Heart murmur On file   Hemorrhage of gastrointestinal tract 05/04/2011   Hepatic encephalopathy (HCC) 2025   Hypertension 2021   Hypothyroidism    Iron deficiency anemia    Liver disease    chemotherapy complication, per pt, shunts placed to bypass liver   Malignant neoplasm of cecum (HCC)    Portal hypertension (HCC)    Skin cancer 2019   Splenomegaly    Past Surgical History:  Procedure Laterality Date   COLON SURGERY  2004   Cancer   COSMETIC SURGERY  2021   Skin cancer   ESOPHAGEAL VARICE LIGATION     ESOPHAGOGASTRODUODENOSCOPY N/A 05/28/2024   Procedure: EGD (ESOPHAGOGASTRODUODENOSCOPY);  Surgeon: Albertus Gordy HERO, MD;  Location: [facility];  Service: Gastroenterology;  Laterality: N/A;   EYE SURGERY     HEMICOLECTOMY  01/08/2003   IR RADIOLOGIST EVAL & MGMT   12/20/2020   IR RADIOLOGIST EVAL & MGMT  05/29/2021   LIVER SURGERY     shunts placed after chemo complication   SKIN FULL THICKNESS GRAFT N/A 09/12/2019   Procedure: debridement and FTSG to the nose from left upper arm;  Surgeon: Elisabeth Craig RAMAN, MD;  Location: [facility];  Service: Plastics;  Laterality: N/A;  2 hours, please   TIPS PROCEDURE     Patient Active Problem List   Diagnosis Date Noted   AMS (altered mental status) 07/01/2024   Heme positive stool 05/28/2024   Acute gastric ulcer without hemorrhage or perforation 05/28/2024   Gastritis and gastroduodenitis 05/28/2024   Acute on chronic anemia 05/27/2024   SIRS (systemic inflammatory response syndrome) (HCC) 05/21/2024   Acute prerenal azotemia 05/21/2024   Paroxysmal atrial fibrillation (HCC) 05/21/2024   Acute metabolic encephalopathy 05/20/2024   Vision disturbance 05/11/2024   Patent foramen ovale 04/16/2024   Acute CVA (cerebrovascular accident) (HCC) 04/15/2024   Abnormal CBC 04/10/2024   Hyperkalemia 04/05/2024   Hypoglycemia 04/05/2024   Macrocytic anemia 04/05/2024   Cerebrovascular accident (CVA) (HCC) 03/16/2024   History of stroke 01/06/2024   Thrombocytopenia 12/21/2023   Decreased GFR 12/10/2023   Preventative health care 09/09/2023   Lower extremity  edema 08/10/2023   Cellulitis of both lower extremities 08/04/2023   Altered mental status 08/04/2023   Bradycardia 05/07/2023   Protein-calorie malnutrition, severe 02/26/2023   Weight loss 02/24/2023   Other constipation 02/24/2023   Generalized abdominal pain 02/24/2023   Hospital discharge follow-up 02/24/2023   Weakness 01/04/2023   Confusion 08/26/2022   Frequent UTI 05/18/2022   Anemia of chronic disease 05/18/2022   AKI (acute kidney injury) 04/30/2022   Tachycardia-bradycardia syndrome (HCC) 02/05/2022   Hypomagnesemia 02/04/2022   Hepatic encephalopathy (HCC) 02/03/2022   Basal cell carcinoma (BCC) 05/06/2021    Hypertension 05/06/2021   Cirrhosis (HCC) 12/04/2020   Murmur, cardiac 03/19/2020   Acquired hypothyroidism 06/30/2019   History of basal cell cancer 06/30/2019   Hx of colon cancer, stage III 11/30/2011   Portal hypertension (HCC) 01/23/2008    ONSET DATE: 07-01-24 for most recent hospitalization  REFERRING DIAG:  Diagnosis  K76.82 (ICD-10-CM) - Hepatic encephalopathy (HCC)  R53.1 (ICD-10-CM) - Weakness    THERAPY DIAG:  Muscle weakness (generalized)  Unsteadiness on feet  Difficulty in walking, not elsewhere classified  Rationale for Evaluation and Treatment: Rehabilitation  SUBJECTIVE:                                                                                                                                                                                             SUBJECTIVE STATEMENT: Pt reports Rt hip pain that started last Saturday - went to a baseball game Thurs night and then went to [employer] on Friday in which she walked across a soccer field; husband says it was pretty chilly both nights and wonders if that could be playing a part.  Pt reports the pain is maybe a little worse than it was on Monday this week at PT - says the stretches did not really help much  Pt accompanied by: spouse, [name]  PERTINENT HISTORY:  Per chart note 07-01-24; past medical history significant for cirrhosis, prior hepatic encephalopathy, acquired hypothyroidism, presented to hospital with altered mental status and elevated ammonia level for 5 days with generalized weakness.  hospitalized 07-01-24 - 07-03-24 with hepatic encephalopathy PMHx of  Stroke Hypothyroidism Cecal adenocarcinoma, s/p resection and chemotherapy in 2004 Liver disease, cirrhosis,  Skin Cancer  Hypertension  PAIN:   Are you having pain? Yes: NPRS scale: yes - 8/10 intensity Pain location: Rt lateral hip  Pain description: throbbing, sore Aggravating factors: prolonged standing/walking; have to be careful how I  lay - able to lie on Rt side if heat is on it Relieving factors: [place] & heat   PRECAUTIONS: Fall  RED FLAGS: None  WEIGHT BEARING RESTRICTIONS: No  FALLS: Has patient fallen in last 6 months? No  LIVING ENVIRONMENT: Lives with: lives with their spouse Lives in: House/apartment Stairs: No Has following equipment at home: Ramped entry; SPC, RW, shower chair, manual wheelchair, BSC  PLOF: Independent with basic ADLs, Independent with household mobility with device, Independent with transfers, and Needs assistance with homemaking  PATIENT GOALS: walk without my cane; increase energy   OBJECTIVE:  Note: Objective measures were completed at Evaluation unless otherwise noted.  DIAGNOSTIC FINDINGS: CT scan 06-19-24 IMPRESSION: 1. No evidence of acute intracranial abnormality. 2. Similar prior left PCA territory infarct and chronic microvascular ischemic change.  COGNITION: Overall cognitive status: Within functional limits for tasks assessed   SENSATION: Not tested  COORDINATION: WFL's bil. LE's    POSTURE: rounded shoulders, forward head, and increased thoracic kyphosis  LOWER EXTREMITY ROM:   WFL's bil. LE's   LOWER EXTREMITY MMT:    MMT Right Eval Left Eval  Hip flexion 4 4  Hip extension    Hip abduction    Hip adduction    Hip internal rotation    Hip external rotation    Knee flexion 4 4  Knee extension 5 5  Ankle dorsiflexion 5 5  Ankle plantarflexion    Ankle inversion    Ankle eversion    (Blank rows = not tested)  BED MOBILITY:  Not tested =pt reports independent with bed mobility  TRANSFERS: Sit to stand: Modified independence  Assistive device utilized: bil. Armrests of chair     Stand to sit: Modified independence  Assistive device utilized: bil. Armrests of chair      RAMP:  Not tested  CURB:  Not tested  STAIRS: Not tested GAIT: Findings: Gait Characteristics: step through pattern, decreased step length- Right, and decreased  step length- Left, Distance walked: 75', Assistive device utilized:Single point cane, Level of assistance: CGA, and Comments: SPC with small quad tip used during eval; pt reports she is beginning to ambulate very short distances in home without use of SPC; slow gait speed   FUNCTIONAL TESTS:  5 times sit to stand: 35.88 secs without UE support - from chair Timed up and go (TUG): 19.38 secs with SPC 10 meter walk test:  20.65 secs = 1.59 ft/sec                                                                                                                                TREATMENT: 08-31-24  NeuroRe-ed: Pt performed alternating tap ups to 6 step inside // bars  - 5 reps each leg; pt reported feeling more discomfort in Rt hip so step was changed to a 4 step; pt performed 5 reps each leg alternating without UE support and requested seated rest period  TherEx: Pt performed 5x sit to stand without UE support from mat  SciFit level 3.0 x 5 with bil. UE's & LE's for strengthening and endurance training  Gait:    Pt gait trained from mat to // bars with SPC with SBA approx. 47'  Gait trained 115' with SPC after activity inside // bars - SBA - pt reported feeling the discomfort in her Rt hip   Ice pack applied to Rt hip region x 10 with pt seated in chair  Self Care: Discussed symptoms of hip pain being consistent with trochanteric bursitis; pt reported moderate tenderness with deep palpation of Rt femoral head region;  recommend use of ice rather than heat to pt and husband   Medbridge  Access Code: [email or web address] URL: [email or web address] Date: 08/09/2024 Prepared by: Rock Kussmaul  Exercises - Sit to Stand  - 1 x daily - 7 x weekly - 1 sets - 10 reps - Standing March with Counter Support  - 1 x daily - 7 x weekly - 1 sets - 10 reps DATE: 08-08-24   PATIENT EDUCATION: Education details: eval results, goals, POC including recommended frequency and duration; HEP - Medbridge   [email or web address] Person educated: Patient and Spouse Education method: Explanation, Demonstration, and Handouts Education comprehension: verbalized understanding and needs further education  HOME EXERCISE PROGRAM: See above - Medbridge [email or web address]  GOALS: Goals reviewed with patient? Yes  SHORT TERM GOALS: Target date: 09-08-24  Improve 5x sit to stand transfer score to </= 28 secs without UE support from chair to demo improved LE strength.  Baseline:  5 times sit to stand: 35.88 secs without UE support - from chair Goal status: INITIAL  2.  Improve TUG score to </= 16 secs with SPC to demonstrate reduced fall risk. Baseline: 19.38 secs with SPC Goal status: INITIAL  3.  Increase gait velocity to >/= 1.9 ft/sec with use of SPC for increased gait efficiency. Baseline:  10 meter walk test:  20.65 secs = 1.59 ft/sec with SPC  Goal status: INITIAL  4.  Ambulate 15' without use of SPC with supervision for household ambulation.  Baseline:  Goal status: INITIAL  5.   Amb. 2 nonstop with use of SPC with SBA to CGA to demonstrate improved endurance/activity tolerance. Baseline:  Goal status: INITIAL  6.  Independent in HEP for balance and LE strengthening.  Baseline:  Goal status: INITIAL    LONG TERM GOALS: Target date: 10-06-24  Improve 5x sit to stand transfer score to </= 22 secs without UE support from chair to demo improved LE strength.  Baseline:  5 times sit to stand: 35.88 secs without UE support - from chair Goal status: INITIAL  2.  Improve TUG score to </= 13.5 secs with SPC to demonstrate reduced fall risk. Baseline: 19.38 secs with SPC Goal status: INITIAL  3.  Increase Berg balance test score by at least 5 points to demo improved balance and decreased fall risk.  Baseline: TBA Goal status: INITIAL  4.  Pt will report modified independent household ambulation without use of SPC. Baseline:  Goal status: INITIAL  5.  Increase gait velocity to >/= 2.4 ft/sec with use  of SPC for increased gait efficiency. Baseline:  10 meter walk test:  20.65 secs = 1.59 ft/sec with SPC  Goal status: INITIAL  6.  Amb. 4 nonstop with use of SPC with SBA for increased community accessibility.  Baseline:  Goal status: INITIAL  ASSESSMENT:  CLINICAL IMPRESSION: Pt's activity tolerance very limited today by c/o Rt hip pain; pt's symptoms are consistent with Rt trochanteric bursitis.  Ice pack applied at end of session for 10 with pt reporting some pain relief  upon standing at end of session in preparation for leaving.  Recommended use of ice rather than heat at home for pain management but strongly encouraged pt and husband to follow up with PCP at earliest convenience for diagnosis as this new onset of pain is impacting activity tolerance & participation in PT.  Both pt and husband verbalized understanding and agreement.   OBJECTIVE IMPAIRMENTS: decreased activity tolerance, decreased balance, decreased endurance, and difficulty walking.   ACTIVITY LIMITATIONS: carrying, lifting, bending, squatting, stairs, and locomotion level  PARTICIPATION LIMITATIONS: meal prep, cleaning, laundry, driving, shopping, and community activity  PERSONAL FACTORS: Behavior pattern, Past/current experiences, Time since onset of injury/illness/exacerbation, and 1-2 comorbidities: h/o hepatic encephalopathy with multiple hospitalizations and h/o CVA are also affecting patient's functional outcome.   REHAB POTENTIAL: Good  CLINICAL DECISION MAKING: Evolving/moderate complexity  EVALUATION COMPLEXITY: Moderate  PLAN:  PT FREQUENCY: 2x/week  PT DURATION: 8 weeks  PLANNED INTERVENTIONS: 97110-Therapeutic exercises, 97530- Therapeutic activity, [id number]- Neuromuscular re-education, 97535- Self Care, [id number]- Gait training, and Patient/Family education  PLAN FOR NEXT SESSION:  how is Rt hip pain?  See PCP for diagnosis? do Berg balance test; continue balance and gait training   Sabriya Yono, [place], PT 08/31/2024, 4:09 PM

## 2024-09-02 ENCOUNTER — Ambulatory Visit: Admission: EM | Admit: 2024-09-02 | Discharge: 2024-09-02 | Disposition: A | Discharge status: Home / Self Care

## 2024-09-02 DIAGNOSIS — B029 Zoster without complications: Secondary | ICD-10-CM | POA: Diagnosis not present

## 2024-09-02 HISTORY — DX: Cerebral infarction, unspecified: I63.9

## 2024-09-02 HISTORY — DX: Reserved for concepts with insufficient information to code with codable children: IMO0002

## 2024-09-02 HISTORY — DX: Acute myocardial infarction, unspecified: I21.9

## 2024-09-02 HISTORY — DX: Encounter for other specified aftercare: Z51.89

## 2024-09-02 MED ORDER — VALACYCLOVIR HCL 1 G PO TABS: 1000.0000 mg | ORAL_TABLET | Freq: Three times a day (TID) | ORAL | 0 refills | Status: AC

## 2024-09-02 NOTE — Discharge Instructions (Signed)
 Take medication as prescribed.

## 2024-09-02 NOTE — ED Provider Notes (Signed)
 [name]    CSN: [id number] Arrival date & time: 09/02/24  1331      History   Chief Complaint Chief Complaint  Patient presents with   Rash    HPI [name] is a 73 y.o. female.   Patient here concerned with rash right thigh x 2 days.  Reports before rash started pain aching feeling in the right leg.  Rashes erythematous with vesicular lesions.    Past Medical History:  Diagnosis Date   Acute hepatic encephalopathy (HCC) 03/30/2024   Acute urinary retention 05/04/2022   Allergy 1952   Contrast dye   Arthritis    Knees and thumb   Blood transfusion without reported diagnosis    Cancer [facility])    cecum   Cataract    Surgery scheduled July 2023   Colon cancer (HCC) 2003   Elevated liver function tests    Esophageal varices (HCC)    Heart murmur    Hemorrhage of gastrointestinal tract 05/04/2011   Hepatic encephalopathy (HCC) 2025   Hypertension    Hypothyroidism    Iron deficiency anemia    Liver disease    chemotherapy complication, per pt, shunts placed to bypass liver   Malignant neoplasm of cecum (HCC)    Myocardial infarction [facility]) Jan 2025   Portal hypertension (HCC)    Skin cancer 2019   Splenomegaly    Stroke (HCC)    Ulcer 05/2024    Patient Active Problem List   Diagnosis Date Noted   AMS (altered mental status) 07/01/2024   Heme positive stool 05/28/2024   Acute gastric ulcer without hemorrhage or perforation 05/28/2024   Gastritis and gastroduodenitis 05/28/2024   Acute on chronic anemia 05/27/2024   SIRS (systemic inflammatory response syndrome) (HCC) 05/21/2024   Acute prerenal azotemia 05/21/2024   Paroxysmal atrial fibrillation (HCC) 05/21/2024   Acute metabolic encephalopathy 05/20/2024   Vision disturbance 05/11/2024   Patent foramen ovale 04/16/2024   Acute CVA (cerebrovascular accident) (HCC) 04/15/2024   Abnormal CBC 04/10/2024   Hyperkalemia 04/05/2024   Hypoglycemia 04/05/2024   Macrocytic anemia  04/05/2024   Cerebrovascular accident (CVA) (HCC) 03/16/2024   History of stroke 01/06/2024   Thrombocytopenia 12/21/2023   Decreased GFR 12/10/2023   Preventative health care 09/09/2023   Lower extremity edema 08/10/2023   Cellulitis of both lower extremities 08/04/2023   Altered mental status 08/04/2023   Bradycardia 05/07/2023   Protein-calorie malnutrition, severe 02/26/2023   Weight loss 02/24/2023   Other constipation 02/24/2023   Generalized abdominal pain 02/24/2023   Hospital discharge follow-up 02/24/2023   Weakness 01/04/2023   Confusion 08/26/2022   Frequent UTI 05/18/2022   Anemia of chronic disease 05/18/2022   AKI (acute kidney injury) 04/30/2022   Tachycardia-bradycardia syndrome (HCC) 02/05/2022   Hypomagnesemia 02/04/2022   Hepatic encephalopathy (HCC) 02/03/2022   Basal cell carcinoma (BCC) 05/06/2021   Hypertension 05/06/2021   Cirrhosis (HCC) 12/04/2020   Murmur, cardiac 03/19/2020   Acquired hypothyroidism 06/30/2019   History of basal cell cancer 06/30/2019   Hx of colon cancer, stage III 11/30/2011   Portal hypertension (HCC) 01/23/2008    Past Surgical History:  Procedure Laterality Date   COLON SURGERY     Cancer   COSMETIC SURGERY     Skin cancer   ESOPHAGEAL VARICE LIGATION     ESOPHAGOGASTRODUODENOSCOPY N/A 05/28/2024   Procedure: EGD (ESOPHAGOGASTRODUODENOSCOPY);  Surgeon: Albertus Gordy HERO, MD;  Location: [facility];  Service: Gastroenterology;  Laterality: N/A;   EYE SURGERY  For cataracts   HEMICOLECTOMY  01/08/2003   IR RADIOLOGIST EVAL & MGMT  12/20/2020   IR RADIOLOGIST EVAL & MGMT  05/29/2021   LIVER SURGERY     shunts placed after chemo complication   SKIN FULL THICKNESS GRAFT N/A 09/12/2019   Procedure: debridement and FTSG to the nose from left upper arm;  Surgeon: Elisabeth Craig RAMAN, MD;  Location: [facility];  Service: Plastics;  Laterality: N/A;  2 hours, please   TIPS PROCEDURE      OB History   No  obstetric history on file.      Home Medications    Prior to Admission medications   Medication Sig Start Date End Date Taking? Authorizing Provider  ACCU-CHEK GUIDE TEST test strip 4 (four) times daily. 04/05/24  Yes [provider]  valACYclovir (VALTREX) 1000 MG tablet Take 1 tablet (1,000 mg total) by mouth 3 (three) times daily. 09/02/24  Yes Juleen Rush, PA-C  Accu-Chek Softclix Lancets lancets 3 (three) times daily. 04/05/24   [provider]  aspirin  EC 81 MG tablet Take 1 tablet (81 mg total) by mouth daily. Swallow whole. 12/26/23   Bryn Bernardino NOVAK, MD  Blood Glucose Monitoring Suppl DEVI 1 each by Does not apply route in the morning, at noon, and at bedtime. May substitute to any manufacturer covered by patient's insurance. 04/05/24   Rojelio Nest, DO  CRANBERRY PO Take 1 tablet by mouth 2 (two) times daily.    [provider]  ENULOSE  10 GM/15ML SOLN TAKE 45 MLS BY MOUTH FOUR TIMES DAILY 07/21/24   [provider]  ferrous sulfate  325 (65 FE) MG tablet Take 1 tablet (325 mg total) by mouth 2 (two) times daily with a meal. 05/28/24 08/26/24  Shahmehdi, Adriana LABOR, MD  furosemide  (LASIX ) 20 MG tablet Take 20-40 mg by mouth See admin instructions. Take 40 mg by mouth every morning. Decrease the dose to 20 mg if the blood pressure is low.    [provider]  hydrocortisone  (ANUSOL -HC) 2.5 % rectal cream Place 1 Application rectally 2 (two) times daily. 06/30/24   [name], NP  lactulose  (CHRONULAC ) 10 GM/15ML solution Take 45 mLs (30 g total) by mouth 4 (four) times daily. 06/10/24   Rizwan, Saima, MD  levOCARNitine  (CARNITOR ) 330 MG tablet Take 330 mg by mouth See admin instructions. Take 330 mg by mouth with breakfast, between 3-5 PM, and at bedtime 05/23/22   [provider]  liver oil-zinc  oxide (DESITIN) 40 % ointment Apply topically daily as needed for irritation. 03/13/23   Pokhrel, Vernal, MD  magnesium  oxide (MAG-OX) 400 MG tablet  Take 1 tablet (400 mg total) by mouth daily. 04/05/24   Rojelio Nest, DO  Multiple Vitamin (MULTI-VITAMIN) tablet Take 1 tablet by mouth in the morning.    [provider]  pantoprazole  (PROTONIX ) 40 MG tablet Take 1 tablet (40 mg total) by mouth daily. 08/02/24 09/01/24  Abran Rush SAILOR, MD  Phenylephrine  HCl (PREPARATION H RE) Place 1 Dose rectally as needed (hemorrhoids).    [provider]  polyethylene glycol (MIRALAX  / GLYCOLAX ) 17 g packet Take 17 g by mouth daily. 06/10/24   Rizwan, Saima, MD  potassium chloride  SA (KLOR-CON  M) 20 MEQ tablet Take 1 tablet (20 mEq total) by mouth daily. 06/20/24   Darci Pore, MD  Probiotic Product (PROBIOTIC PEARLS WOMENS) CAPS Take 1 capsule by mouth in the morning.    [provider]  PROTEIN PO Take 1  Bottle by mouth See admin instructions. Fair Life Nutrition Protein Shake- Drink 1 bottle by mouth daily if she can stomach it.    [provider]  rifaximin  (XIFAXAN ) 550 MG TABS tablet Take 1 tablet (550 mg total) by mouth 2 (two) times daily. 12/25/23   Bryn Bernardino NOVAK, MD  spironolactone  (ALDACTONE ) 50 MG tablet Take 1 tablet (50 mg total) by mouth daily. 06/21/24   Darci Pore, MD  SYNTHROID  75 MCG tablet Take 1 tablet (75 mcg total) by mouth daily before breakfast. 08/17/24   [name], NP    Family History Family History  Problem Relation Age of Onset   Arthritis Mother    Hearing loss Mother    Heart disease Mother    Hypertension Mother    Miscarriages / [place] Mother    Arthritis Father    Diabetes Father    Heart disease Father    Cancer Maternal Aunt    Heart disease Maternal Grandfather    Breast cancer Neg Hx    Colon cancer Neg Hx    Esophageal cancer Neg Hx    Pancreatic cancer Neg Hx    Stomach cancer Neg Hx    Stroke Neg Hx     Social History Social History   Tobacco Use   Smoking status: Never   Smokeless tobacco: Never   Tobacco comments:    Never smoked   Vaping Use   Vaping status: Never Used  Substance Use Topics   Alcohol  use: Never   Drug use: Never     Allergies   Contrast media [iodinated contrast media]   Review of Systems Review of Systems  Constitutional:  Negative for chills, fatigue and fever.  HENT:  Negative for congestion, ear pain, nosebleeds, postnasal drip, rhinorrhea, sinus pressure, sinus pain and sore throat.   Eyes:  Negative for pain and redness.  Respiratory:  Negative for cough, shortness of breath and wheezing.   Gastrointestinal:  Negative for abdominal pain, diarrhea, nausea and vomiting.  Musculoskeletal:  Positive for myalgias. Negative for arthralgias and joint swelling.  Skin:  Positive for rash.  Neurological:  Negative for light-headedness and headaches.  Hematological:  Negative for adenopathy. Does not bruise/bleed easily.  Psychiatric/Behavioral:  Negative for confusion and sleep disturbance.      Physical Exam Triage Vital Signs ED Triage Vitals  Encounter Vitals Group     BP 09/02/24 1430 118/73     Girls Systolic BP Percentile --      Girls Diastolic BP Percentile --      Boys Systolic BP Percentile --      Boys Diastolic BP Percentile --      Pulse Rate 09/02/24 1430 60     Resp 09/02/24 1430 18     Temp 09/02/24 1430 97.8 F (36.6 C)     Temp Source 09/02/24 1430 Temporal     SpO2 09/02/24 1430 96 %     Weight 09/02/24 1427 155 lb 10.3 oz (70.6 kg)     Height 09/02/24 1427 5' 4 (1.626 m)     Head Circumference --      Peak Flow --      Pain Score 09/02/24 1423 8     Pain Loc --      Pain Education --      Exclude from Growth Chart --    No data found.  Updated Vital Signs BP 118/73 (BP Location: Left Arm)   Pulse 60   Temp 97.8 F (36.6  C) (Temporal)   Resp 18   Ht 5' 4 (1.626 m)   Wt 155 lb 10.3 oz (70.6 kg)   SpO2 96%   BMI 26.72 kg/m   Visual Acuity Right Eye Distance:   Left Eye Distance:   Bilateral Distance:    Right Eye Near:   Left Eye Near:     Bilateral Near:     Physical Exam Vitals and nursing note reviewed.  Constitutional:      General: She is not in acute distress.    Appearance: Normal appearance. She is not ill-appearing.  HENT:     Head: Normocephalic and atraumatic.  Eyes:     General: No scleral icterus.    Extraocular Movements: Extraocular movements intact.     Conjunctiva/sclera: Conjunctivae normal.  Pulmonary:     Effort: Pulmonary effort is normal. No respiratory distress.  Musculoskeletal:        General: Normal range of motion.     Cervical back: Normal range of motion. No rigidity.  Skin:    General: Skin is warm.     Coloration: Skin is not jaundiced.     Findings: Lesion and rash present. Rash is vesicular.     Comments: Vesicular lesion on erythematous base right thigh in dermatomal pattern.  Neurological:     General: No focal deficit present.     Mental Status: She is alert and oriented to person, place, and time.     Motor: No weakness.     Gait: Gait normal.  Psychiatric:        Mood and Affect: Mood normal.        Behavior: Behavior normal.      UC Treatments / Results  Labs (all labs ordered are listed, but only abnormal results are displayed) Labs Reviewed - No data to display  EKG   Radiology No results found.  Procedures Procedures (including critical care time)  Medications Ordered in UC Medications - No data to display  Initial Impression / Assessment and Plan / UC Course  I have reviewed the triage vital signs and the nursing notes.  Pertinent labs & imaging results that were available during my care of the patient were reviewed by me and considered in my medical decision making (see chart for details).     zoster without complications Start valacyclovir Discussed medication side effects with patient and her husband. They we will follow-up with hepatologist on Monday. Final Clinical Impressions(s) / UC Diagnoses   Final diagnoses:  Herpes zoster without  complication     Discharge Instructions      Take medication as prescribed    ED Prescriptions     Medication Sig Dispense Auth. Provider   valACYclovir (VALTREX) 1000 MG tablet Take 1 tablet (1,000 mg total) by mouth 3 (three) times daily. 21 tablet Juleen Rush, PA-C      PDMP not reviewed this encounter.   Juleen Rush, PA-C 09/02/24 1537

## 2024-09-02 NOTE — ED Triage Notes (Signed)
 Patient presents to urgent care with husband with a painful rash resembling a connect-the-dots pattern. They have a history of Varicella but no history of shingles so far. The rash was first noticed yesterday, accompanied by small blisters, and the patient was seen on Thursday.

## 2024-09-04 ENCOUNTER — Ambulatory Visit: Admitting: Physical Therapy

## 2024-09-05 ENCOUNTER — Ambulatory Visit: Admitting: Physical Therapy

## 2024-09-05 ENCOUNTER — Telehealth: Payer: Self-pay

## 2024-09-05 DIAGNOSIS — B0229 Other postherpetic nervous system involvement: Secondary | ICD-10-CM

## 2024-09-05 MED ORDER — GABAPENTIN 100 MG PO CAPS: 100.0000 mg | ORAL_CAPSULE | Freq: Two times a day (BID) | ORAL | 0 refills | Status: DC

## 2024-09-05 NOTE — Addendum Note (Signed)
 Addended by: [name] on: 09/05/2024 12:58 PM   Modules accepted: Orders

## 2024-09-05 NOTE — Telephone Encounter (Signed)
 Called and spoke with patient.  Relayed information and verbalized understanding.  Stated they will pick up script  No questions or concerns.

## 2024-09-05 NOTE — Telephone Encounter (Signed)
 Copied from CRM [id number]. Topic: Clinical - Medical Advice >> Sep 04, 2024 10:55 AM [name] wrote: Reason for CRM: Patients husband called in stating that she had to go to [facility] on Saturday for a rash  and was told she had shingles. She was prescribed an antiviral medication valACYclovir (VALTREX) 1000 MG tablet. Husband called in today stating today stating that it is now itchy and painful. He consulted her liver doctor to see if she could be prescribed any pain medicine.They advised her to contact PCP to know what would be the safest to take or could be prescribed for the shingle pain since it is a nerve pain. Please advise  [facility] - [place], [place] - [address]  Phone: [phone] Fax: [phone]

## 2024-09-05 NOTE — Telephone Encounter (Signed)
 We can try some gabapentin. It will be low dose. Start with taking it twice a day. Once in morning and once at night. This can cause sedation, so be aware.   Let me know if this is helpful and tolerated. If it is we can increase the dose slowly but at my direction

## 2024-09-06 ENCOUNTER — Encounter: Payer: Self-pay | Admitting: Internal Medicine

## 2024-09-07 ENCOUNTER — Ambulatory Visit: Admitting: Physical Therapy

## 2024-09-11 ENCOUNTER — Ambulatory Visit

## 2024-09-11 VITALS — BP 124/69 | HR 85

## 2024-09-11 DIAGNOSIS — R262 Difficulty in walking, not elsewhere classified: Secondary | ICD-10-CM

## 2024-09-11 DIAGNOSIS — R2689 Other abnormalities of gait and mobility: Secondary | ICD-10-CM | POA: Diagnosis not present

## 2024-09-11 DIAGNOSIS — M6281 Muscle weakness (generalized): Secondary | ICD-10-CM

## 2024-09-11 DIAGNOSIS — R2681 Unsteadiness on feet: Secondary | ICD-10-CM

## 2024-09-11 NOTE — Therapy (Incomplete)
 OUTPATIENT PHYSICAL THERAPY NEURO TREATMENT NOTE   Patient Name: [name] MRN: [id number] DOB:November 10, 1951, 73 y.o., female Today's Date: 09/13/2024   PCP: [name], NP REFERRING PROVIDER: [name], NP  END OF SESSION:   09/11/24 1444  PT Visits / Re-Eval  Visit Number 5  Number of Visits 17  Date for Recertification  10/06/24  Authorization  Authorization Type [employer] - Auth. required  Authorization Time Period 08-08-24 - 10-03-24  Authorization - Number of Visits 17  Progress Note Due on Visit 10  PT Time Calculation  PT Start Time 1401  PT Stop Time 1444  PT Time Calculation (min) 43 min  PT - End of Session  Equipment Utilized During Treatment Gait belt  Activity Tolerance Other (comment);Patient tolerated treatment well;No increased pain (c/o Rt hip pain which appears to be consistent with bursitis)  Behavior During Therapy [facility] for tasks assessed/performed       Past Medical History:  Diagnosis Date   Acute hepatic encephalopathy (HCC) 03/30/2024   Acute urinary retention 05/04/2022   Allergy 1952   Contrast dye   Arthritis    Knees and thumb   Blood transfusion without reported diagnosis    Cancer [facility])    cecum   Cataract    Surgery scheduled July 2023   Colon cancer (HCC) 2003   Elevated liver function tests    Esophageal varices (HCC)    Heart murmur    Hemorrhage of gastrointestinal tract 05/04/2011   Hepatic encephalopathy (HCC) 2025   Hypertension    Hypothyroidism    Iron deficiency anemia    Liver disease    chemotherapy complication, per pt, shunts placed to bypass liver   Malignant neoplasm of cecum (HCC)    Myocardial infarction [facility]) Jan 2025   Portal hypertension (HCC)    Skin cancer 2019   Splenomegaly    Stroke [facility])    Ulcer 05/2024   Past Surgical History:  Procedure Laterality Date   COLON SURGERY     Cancer   COSMETIC SURGERY     Skin cancer   ESOPHAGEAL VARICE LIGATION      ESOPHAGOGASTRODUODENOSCOPY N/A 05/28/2024   Procedure: EGD (ESOPHAGOGASTRODUODENOSCOPY);  Surgeon: Albertus Gordy HERO, MD;  Location: [facility];  Service: Gastroenterology;  Laterality: N/A;   EYE SURGERY     For cataracts   HEMICOLECTOMY  01/08/2003   IR RADIOLOGIST EVAL & MGMT  12/20/2020   IR RADIOLOGIST EVAL & MGMT  05/29/2021   LIVER SURGERY     shunts placed after chemo complication   SKIN FULL THICKNESS GRAFT N/A 09/12/2019   Procedure: debridement and FTSG to the nose from left upper arm;  Surgeon: Elisabeth Craig RAMAN, MD;  Location: [facility];  Service: Plastics;  Laterality: N/A;  2 hours, please   TIPS PROCEDURE     Patient Active Problem List   Diagnosis Date Noted   AMS (altered mental status) 07/01/2024   Heme positive stool 05/28/2024   Acute gastric ulcer without hemorrhage or perforation 05/28/2024   Gastritis and gastroduodenitis 05/28/2024   Acute on chronic anemia 05/27/2024   SIRS (systemic inflammatory response syndrome) (HCC) 05/21/2024   Acute prerenal azotemia 05/21/2024   Paroxysmal atrial fibrillation (HCC) 05/21/2024   Acute metabolic encephalopathy 05/20/2024   Vision disturbance 05/11/2024   Patent foramen ovale 04/16/2024   Acute CVA (cerebrovascular accident) (HCC) 04/15/2024   Abnormal CBC 04/10/2024   Hyperkalemia 04/05/2024   Hypoglycemia 04/05/2024   Macrocytic anemia 04/05/2024  Cerebrovascular accident (CVA) (HCC) 03/16/2024   History of stroke 01/06/2024   Thrombocytopenia 12/21/2023   Decreased GFR 12/10/2023   Preventative health care 09/09/2023   Lower extremity edema 08/10/2023   Cellulitis of both lower extremities 08/04/2023   Altered mental status 08/04/2023   Bradycardia 05/07/2023   Protein-calorie malnutrition, severe 02/26/2023   Weight loss 02/24/2023   Other constipation 02/24/2023   Generalized abdominal pain 02/24/2023   Hospital discharge follow-up 02/24/2023   Weakness 01/04/2023   Confusion 08/26/2022    Frequent UTI 05/18/2022   Anemia of chronic disease 05/18/2022   AKI (acute kidney injury) 04/30/2022   Tachycardia-bradycardia syndrome (HCC) 02/05/2022   Hypomagnesemia 02/04/2022   Hepatic encephalopathy (HCC) 02/03/2022   Basal cell carcinoma (BCC) 05/06/2021   Hypertension 05/06/2021   Cirrhosis (HCC) 12/04/2020   Murmur, cardiac 03/19/2020   Acquired hypothyroidism 06/30/2019   History of basal cell cancer 06/30/2019   Hx of colon cancer, stage III 11/30/2011   Portal hypertension (HCC) 01/23/2008    ONSET DATE: 07-01-24 for most recent hospitalization  REFERRING DIAG:  Diagnosis  K76.82 (ICD-10-CM) - Hepatic encephalopathy (HCC)  R53.1 (ICD-10-CM) - Weakness    THERAPY DIAG:  Muscle weakness (generalized)  Unsteadiness on feet  Difficulty in walking, not elsewhere classified  Rationale for Evaluation and Treatment: Rehabilitation  SUBJECTIVE:                                                                                                                                                                                             SUBJECTIVE STATEMENT: Patient states they are feeling better now but went to urgent care and was given anti-viral pills for shingles and a pain med but she stopped it over the weekend. Patient reports pain of 0/10 today. But she states just being really tired lately ever since the shingles.  Pt accompanied by: spouse, [name]  PERTINENT HISTORY:  Per chart note 07-01-24; past medical history significant for cirrhosis, prior hepatic encephalopathy, acquired hypothyroidism, presented to hospital with altered mental status and elevated ammonia level for 5 days with generalized weakness.  hospitalized 07-01-24 - 07-03-24 with hepatic encephalopathy PMHx of  Stroke Hypothyroidism Cecal adenocarcinoma, s/p resection and chemotherapy in 2004 Liver disease, cirrhosis,  Skin Cancer  Hypertension  PAIN:   Are you having pain? Yes: NPRS scale: yes -  8/10 intensity Pain location: Rt lateral hip  Pain description: throbbing, sore Aggravating factors: prolonged standing/walking; have to be careful how I lay - able to lie on Rt side if heat is on it Relieving factors: [place] & heat   PRECAUTIONS: Fall  RED FLAGS: None  WEIGHT BEARING RESTRICTIONS: No  FALLS: Has patient fallen in last 6 months? No  LIVING ENVIRONMENT: Lives with: lives with their spouse Lives in: House/apartment Stairs: No Has following equipment at home: Ramped entry; SPC, RW, shower chair, manual wheelchair, BSC  PLOF: Independent with basic ADLs, Independent with household mobility with device, Independent with transfers, and Needs assistance with homemaking  PATIENT GOALS: walk without my cane; increase energy   OBJECTIVE:  Note: Objective measures were completed at Evaluation unless otherwise noted.  DIAGNOSTIC FINDINGS: CT scan 06-19-24 IMPRESSION: 1. No evidence of acute intracranial abnormality. 2. Similar prior left PCA territory infarct and chronic microvascular ischemic change.  COGNITION: Overall cognitive status: Within functional limits for tasks assessed   SENSATION: Not tested  COORDINATION: WFL's bil. LE's    POSTURE: rounded shoulders, forward head, and increased thoracic kyphosis  LOWER EXTREMITY ROM:   WFL's bil. LE's   LOWER EXTREMITY MMT:    MMT Right Eval Left Eval  Hip flexion 4 4  Hip extension    Hip abduction    Hip adduction    Hip internal rotation    Hip external rotation    Knee flexion 4 4  Knee extension 5 5  Ankle dorsiflexion 5 5  Ankle plantarflexion    Ankle inversion    Ankle eversion    (Blank rows = not tested)  BED MOBILITY:  Not tested =pt reports independent with bed mobility  TRANSFERS: Sit to stand: Modified independence  Assistive device utilized: bil. Armrests of chair     Stand to sit: Modified independence  Assistive device utilized: bil. Armrests of chair      RAMP:  Not  tested  CURB:  Not tested  STAIRS: Not tested GAIT: Findings: Gait Characteristics: step through pattern, decreased step length- Right, and decreased step length- Left, Distance walked: 75', Assistive device utilized:Single point cane, Level of assistance: CGA, and Comments: SPC with small quad tip used during eval; pt reports she is beginning to ambulate very short distances in home without use of SPC; slow gait speed   FUNCTIONAL TESTS:  5 times sit to stand: 35.88 secs without UE support - from chair Timed up and go (TUG): 19.38 secs with SPC 10 meter walk test:  20.65 secs = 1.59 ft/sec                                                                                                                                TREATMENT: 09-11-24  TherEx, improved LE strength, musculature endurance: - Marches in standing at bar, 2 sets x 14 total reps - Heel raises in standing at bar, 2 sets x 10 reps - Seated calf raises x 10 reps - Seated marches x 10 reps *Seated rest needed throughout due to fatigue SciFit level 1.0 x 5 minutes with B UE/LE, strengthening, endurance training.  Ther act, LE functional strength and improved gait: - Sit to stand, 2 sets x 5  reps each side with staggered stance - Pt ambulated from mat to bars and back and to SciFit with SPC and CGA (CGA at end due to fatigue) approx. 52' each day - Continued education for HEP and HEP review  Medbridge  Access Code: [email or web address] URL: [email or web address] Date: 08/09/2024 Prepared by: Rock Kussmaul  Exercises - Sit to Stand  - 1 x daily - 7 x weekly - 1 sets - 10 reps - Standing March with Counter Support  - 1 x daily - 7 x weekly - 1 sets - 10 reps DATE: 08-08-24   PATIENT EDUCATION: Education details: HEP review and practice Person educated: Patient and Spouse Education method: Explanation, Demonstration, and Handouts Education comprehension: verbalized understanding and needs further education  HOME  EXERCISE PROGRAM: See above - Medbridge [email or web address]  GOALS: Goals reviewed with patient? Yes  SHORT TERM GOALS: Target date: 09-08-24  Improve 5x sit to stand transfer score to </= 28 secs without UE support from chair to demo improved LE strength.  Baseline:  5 times sit to stand: 35.88 secs without UE support - from chair Goal status: INITIAL  2.  Improve TUG score to </= 16 secs with SPC to demonstrate reduced fall risk. Baseline: 19.38 secs with SPC Goal status: INITIAL  3.  Increase gait velocity to >/= 1.9 ft/sec with use of SPC for increased gait efficiency. Baseline:  10 meter walk test:  20.65 secs = 1.59 ft/sec with SPC  Goal status: INITIAL  4.  Ambulate 79' without use of SPC with supervision for household ambulation.  Baseline:  Goal status: INITIAL  5.   Amb. 2 nonstop with use of SPC with SBA to CGA to demonstrate improved endurance/activity tolerance. Baseline:  Goal status: INITIAL  6.  Independent in HEP for balance and LE strengthening.  Baseline:  Goal status: INITIAL    LONG TERM GOALS: Target date: 10-06-24  Improve 5x sit to stand transfer score to </= 22 secs without UE support from chair to demo improved LE strength.  Baseline:  5 times sit to stand: 35.88 secs without UE support - from chair Goal status: INITIAL  2.  Improve TUG score to </= 13.5 secs with SPC to demonstrate reduced fall risk. Baseline: 19.38 secs with SPC Goal status: INITIAL  3.  Increase Berg balance test score by at least 5 points to demo improved balance and decreased fall risk.  Baseline: TBA Goal status: INITIAL  4.  Pt will report modified independent household ambulation without use of SPC. Baseline:  Goal status: INITIAL  5.  Increase gait velocity to >/= 2.4 ft/sec with use of SPC for increased gait efficiency. Baseline:  10 meter walk test:  20.65 secs = 1.59 ft/sec with SPC  Goal status: INITIAL  6.  Amb. 4 nonstop with use of SPC with SBA for increased  community accessibility.  Baseline:  Goal status: INITIAL  ASSESSMENT:  CLINICAL IMPRESSION: Patient seemed they were recovering from being sick and was able to tolerate more exercise today. Although fatigue present throughout session, she was able to take breaks and recover during session. All intervention was well tolerated without any pain. Sit to stand and standing LE exercise was used to increase LE strength and balance for improved function and ADLs. SciFit was well tolerated for improved endurance and muscular strength. Patient continues to benefit from skilled physical therapy in order to progress according to plan of care, improve activities of daily living and reduce risk of falls.   OBJECTIVE IMPAIRMENTS:  decreased activity tolerance, decreased balance, decreased endurance, and difficulty walking.   ACTIVITY LIMITATIONS: carrying, lifting, bending, squatting, stairs, and locomotion level  PARTICIPATION LIMITATIONS: meal prep, cleaning, laundry, driving, shopping, and community activity  PERSONAL FACTORS: Behavior pattern, Past/current experiences, Time since onset of injury/illness/exacerbation, and 1-2 comorbidities: h/o hepatic encephalopathy with multiple hospitalizations and h/o CVA are also affecting patient's functional outcome.   REHAB POTENTIAL: Good  CLINICAL DECISION MAKING: Evolving/moderate complexity  EVALUATION COMPLEXITY: Moderate  PLAN:  PT FREQUENCY: 2x/week  PT DURATION: 8 weeks  PLANNED INTERVENTIONS: 97110-Therapeutic exercises, 97530- Therapeutic activity, 97112- Neuromuscular re-education, [phone]- Self Care, [id number]- Gait training, and Patient/Family education  PLAN FOR NEXT SESSION: check short term goals, do Berg balance test?? continue balance and gait training, LE strength   Emmalene Sherry, Student-PT 09/13/2024, 7:41 AM  Delon DELENA Pop, PT, DPT, CBIS

## 2024-09-14 ENCOUNTER — Ambulatory Visit: Admitting: Physical Therapy

## 2024-09-14 DIAGNOSIS — M6281 Muscle weakness (generalized): Secondary | ICD-10-CM

## 2024-09-14 DIAGNOSIS — R2689 Other abnormalities of gait and mobility: Secondary | ICD-10-CM

## 2024-09-14 DIAGNOSIS — R2681 Unsteadiness on feet: Secondary | ICD-10-CM

## 2024-09-14 NOTE — Therapy (Signed)
 OUTPATIENT PHYSICAL THERAPY NEURO TREATMENT NOTE   Patient Name: [name] MRN: [id number] DOB:[birth year], 73 y.o., female Today's Date: 09/17/2024   PCP: [name], NP REFERRING PROVIDER: [name], NP  END OF SESSION:  PT End of Session - 09/17/24 1817     Visit Number 6    Number of Visits 17    Date for Recertification  10/06/24    Authorization Type [employer] - Auth. required    Authorization Time Period 08-08-24 - 10-03-24    Authorization - Visit Number 6    Authorization - Number of Visits 17    Progress Note Due on Visit 10    PT Start Time 1447    PT Stop Time 1530    PT Time Calculation (min) 43 min    Equipment Utilized During Treatment Gait belt    Activity Tolerance Patient tolerated treatment well;Patient limited by fatigue   c/o Rt hip pain which appears to be consistent with bursitis   Behavior During Therapy [facility] for tasks assessed/performed             Past Medical History:  Diagnosis Date   Acute hepatic encephalopathy (HCC) 03/30/2024   Acute urinary retention 05/04/2022   Allergy 1952   Contrast dye   Arthritis    Knees and thumb   Blood transfusion without reported diagnosis    Cancer [facility])    cecum   Cataract    Surgery scheduled July 2023   Colon cancer (HCC) 2003   Elevated liver function tests    Esophageal varices (HCC)    Heart murmur    Hemorrhage of gastrointestinal tract 05/04/2011   Hepatic encephalopathy (HCC) 2025   Hypertension    Hypothyroidism    Iron deficiency anemia    Liver disease    chemotherapy complication, per pt, shunts placed to bypass liver   Malignant neoplasm of cecum (HCC)    Myocardial infarction [facility]) Jan 2025   Portal hypertension (HCC)    Skin cancer 2019   Splenomegaly    Stroke [facility])    Ulcer 05/2024   Past Surgical History:  Procedure Laterality Date   COLON SURGERY     Cancer   COSMETIC SURGERY     Skin cancer   ESOPHAGEAL VARICE LIGATION      ESOPHAGOGASTRODUODENOSCOPY N/A 05/28/2024   Procedure: EGD (ESOPHAGOGASTRODUODENOSCOPY);  Surgeon: Albertus Gordy HERO, MD;  Location: [facility];  Service: Gastroenterology;  Laterality: N/A;   EYE SURGERY     For cataracts   HEMICOLECTOMY  01/08/2003   IR RADIOLOGIST EVAL & MGMT  12/20/2020   IR RADIOLOGIST EVAL & MGMT  05/29/2021   LIVER SURGERY     shunts placed after chemo complication   SKIN FULL THICKNESS GRAFT N/A 09/12/2019   Procedure: debridement and FTSG to the nose from left upper arm;  Surgeon: Elisabeth Craig RAMAN, MD;  Location: [facility];  Service: Plastics;  Laterality: N/A;  2 hours, please   TIPS PROCEDURE     Patient Active Problem List   Diagnosis Date Noted   AMS (altered mental status) 07/01/2024   Heme positive stool 05/28/2024   Acute gastric ulcer without hemorrhage or perforation 05/28/2024   Gastritis and gastroduodenitis 05/28/2024   Acute on chronic anemia 05/27/2024   SIRS (systemic inflammatory response syndrome) (HCC) 05/21/2024   Acute prerenal azotemia 05/21/2024   Paroxysmal atrial fibrillation (HCC) 05/21/2024   Acute metabolic encephalopathy 05/20/2024   Vision disturbance 05/11/2024   Patent  foramen ovale 04/16/2024   Acute CVA (cerebrovascular accident) (HCC) 04/15/2024   Abnormal CBC 04/10/2024   Hyperkalemia 04/05/2024   Hypoglycemia 04/05/2024   Macrocytic anemia 04/05/2024   Cerebrovascular accident (CVA) (HCC) 03/16/2024   History of stroke 01/06/2024   Thrombocytopenia 12/21/2023   Decreased GFR 12/10/2023   Preventative health care 09/09/2023   Lower extremity edema 08/10/2023   Cellulitis of both lower extremities 08/04/2023   Altered mental status 08/04/2023   Bradycardia 05/07/2023   Protein-calorie malnutrition, severe 02/26/2023   Weight loss 02/24/2023   Other constipation 02/24/2023   Generalized abdominal pain 02/24/2023   Hospital discharge follow-up 02/24/2023   Weakness 01/04/2023   Confusion 08/26/2022    Frequent UTI 05/18/2022   Anemia of chronic disease 05/18/2022   AKI (acute kidney injury) 04/30/2022   Tachycardia-bradycardia syndrome (HCC) 02/05/2022   Hypomagnesemia 02/04/2022   Hepatic encephalopathy (HCC) 02/03/2022   Basal cell carcinoma (BCC) 05/06/2021   Hypertension 05/06/2021   Cirrhosis (HCC) 12/04/2020   Murmur, cardiac 03/19/2020   Acquired hypothyroidism 06/30/2019   History of basal cell cancer 06/30/2019   Hx of colon cancer, stage III 11/30/2011   Portal hypertension (HCC) 01/23/2008    ONSET DATE: 07-01-24 for most recent hospitalization  REFERRING DIAG:  Diagnosis  K76.82 (ICD-10-CM) - Hepatic encephalopathy (HCC)  R53.1 (ICD-10-CM) - Weakness    THERAPY DIAG:  Muscle weakness (generalized)  Unsteadiness on feet  Other abnormalities of gait and mobility  Rationale for Evaluation and Treatment: Rehabilitation  SUBJECTIVE:                                                                                                                                                                                             SUBJECTIVE STATEMENT: Patient reports she is tired today; husband reports pt is not doing as well today as she was doing at previous PT session on Monday this week - says her eyes look heavy and tired; will take medication to flush out her system when she gets home - did not take prior to PT appt to avoid having to go to restroom so much  Pt accompanied by: spouse, [name]  PERTINENT HISTORY:  Per chart note 07-01-24; past medical history significant for cirrhosis, prior hepatic encephalopathy, acquired hypothyroidism, presented to hospital with altered mental status and elevated ammonia level for 5 days with generalized weakness.  hospitalized 07-01-24 - 07-03-24 with hepatic encephalopathy PMHx of  Stroke Hypothyroidism Cecal adenocarcinoma, s/p resection and chemotherapy in 2004 Liver disease, cirrhosis,  Skin Cancer  Hypertension  PAIN:   Are  you having pain? Yes: NPRS scale: yes - 2-3/10 intensity Pain  location: Rt lateral hip  Pain description: throbbing, sore Aggravating factors: prolonged standing/walking; have to be careful how I lay - able to lie on Rt side if heat is on it Relieving factors: [place] & heat   PRECAUTIONS: Fall  RED FLAGS: None   WEIGHT BEARING RESTRICTIONS: No  FALLS: Has patient fallen in last 6 months? No  LIVING ENVIRONMENT: Lives with: lives with their spouse Lives in: House/apartment Stairs: No Has following equipment at home: Ramped entry; SPC, RW, shower chair, manual wheelchair, BSC  PLOF: Independent with basic ADLs, Independent with household mobility with device, Independent with transfers, and Needs assistance with homemaking  PATIENT GOALS: walk without my cane; increase energy   OBJECTIVE:  Note: Objective measures were completed at Evaluation unless otherwise noted.  DIAGNOSTIC FINDINGS: CT scan 06-19-24 IMPRESSION: 1. No evidence of acute intracranial abnormality. 2. Similar prior left PCA territory infarct and chronic microvascular ischemic change.  COGNITION: Overall cognitive status: Within functional limits for tasks assessed   SENSATION: Not tested  COORDINATION: WFL's bil. LE's    POSTURE: rounded shoulders, forward head, and increased thoracic kyphosis  LOWER EXTREMITY ROM:   WFL's bil. LE's   LOWER EXTREMITY MMT:    MMT Right Eval Left Eval  Hip flexion 4 4  Hip extension    Hip abduction    Hip adduction    Hip internal rotation    Hip external rotation    Knee flexion 4 4  Knee extension 5 5  Ankle dorsiflexion 5 5  Ankle plantarflexion    Ankle inversion    Ankle eversion    (Blank rows = not tested)  BED MOBILITY:  Not tested =pt reports independent with bed mobility  TRANSFERS: Sit to stand: Modified independence  Assistive device utilized: bil. Armrests of chair     Stand to sit: Modified independence  Assistive device utilized:  bil. Armrests of chair      RAMP:  Not tested  CURB:  Not tested  STAIRS: Not tested GAIT: Findings: Gait Characteristics: step through pattern, decreased step length- Right, and decreased step length- Left, Distance walked: 75', Assistive device utilized:Single point cane, Level of assistance: CGA, and Comments: SPC with small quad tip used during eval; pt reports she is beginning to ambulate very short distances in home without use of SPC; slow gait speed   FUNCTIONAL TESTS:  5 times sit to stand: 35.88 secs without UE support - from chair Timed up and go (TUG): 19.38 secs with SPC 10 meter walk test:  20.65 secs = 1.59 ft/sec                                                                                                                                TREATMENT: 09-14-24  TherAct:  5x sit to stand score 23.31 secs from chair without UE support  TUG score 21.97 secs with SPC  Gait:  Pt amb. 2 with SPC with SBA  to CGA for STG assessment - RPE 4-5/10  Pt amb. 17' with CGA without use of SPC - husband reports pt holds onto furniture, counter in kitchen, walls, etc as needed for assist with balance in the home when amb. Without use of SPC  Gait velocity: 16.57 secs 1st trial:  2nd trial 14.53 secs = 2.26 ft/sec with SPC  TherEx: Step ups RLE and LLE 5 reps each onto 6 step - bil. UE support on hand rails Heel raises 10 reps in standing with bil. UE support on rails Pt performed 5 squats - standing by mat table; reported fatigue after 5 reps  Pt reported fatigue after above exercises - declined doing SciFit exercise due to fatigue  Reviewed HEP with pt and husband  Medbridge  Access Code: [email or web address] URL: [email or web address] Date: 08/09/2024 Prepared by: Rock Kussmaul  Exercises - Sit to Stand  - 1 x daily - 7 x weekly - 1 sets - 10 reps - Standing March with Counter Support  - 1 x daily - 7 x weekly - 1 sets - 10 reps DATE: 08-08-24   PATIENT  EDUCATION: Education details: HEP review and practice Person educated: Patient and Spouse Education method: Explanation, Demonstration, and Handouts Education comprehension: verbalized understanding and needs further education  HOME EXERCISE PROGRAM: See above - Medbridge [email or web address]  GOALS: Goals reviewed with patient? Yes  SHORT TERM GOALS: Target date: 09-08-24  Improve 5x sit to stand transfer score to </= 28 secs without UE support from chair to demo improved LE strength.  Baseline:  5 times sit to stand: 35.88 secs without UE support - from chair; 09-14-24: 23.31 secs without UE support Goal status: MET 09-14-24  2.  Improve TUG score to </= 16 secs with SPC to demonstrate reduced fall risk. Baseline: 19.38 secs with SPC;   09-14-24 - 21.97 secs   Goal status: Not met 09-14-24  3.  Increase gait velocity to >/= 1.9 ft/sec with use of SPC for increased gait efficiency. Baseline:  10 meter walk test:  20.65 secs = 1.59 ft/sec with SPC ;  16.57, 14.53 = 2.26 ft/sec with SPC Goal status: Goal met 09-14-24  4.  Ambulate 58' without use of SPC with supervision for household ambulation.  Baseline:  Goal status: Partially met 09-14-24  5.   Amb. 2 nonstop with use of SPC with SBA to CGA to demonstrate improved endurance/activity tolerance. Baseline: 2:03 - RPE 4-5/10 Goal status: MET 09-14-24  6.  Independent in HEP for balance and LE strengthening.  Baseline:  Goal status: Ongoing 09-14-24    LONG TERM GOALS: Target date: 10-06-24  Improve 5x sit to stand transfer score to </= 22 secs without UE support from chair to demo improved LE strength.  Baseline:  5 times sit to stand: 35.88 secs without UE support - from chair Goal status: INITIAL  2.  Improve TUG score to </= 13.5 secs with SPC to demonstrate reduced fall risk. Baseline: 19.38 secs with SPC Goal status: INITIAL  3.  Increase Berg balance test score by at least 5 points to demo improved balance and decreased  fall risk.  Baseline: TBA Goal status: INITIAL  4.  Pt will report modified independent household ambulation without use of SPC. Baseline:  Goal status: INITIAL  5.  Increase gait velocity to >/= 2.4 ft/sec with use of SPC for increased gait efficiency. Baseline:  10 meter walk test:  20.65 secs = 1.59 ft/sec with SPC  Goal status: INITIAL  6.  Amb. 4 nonstop with use of SPC with SBA for increased community accessibility.  Baseline:  Goal status: INITIAL  ASSESSMENT:  CLINICAL IMPRESSION: PT session focused on STG assessment and LE strengthening exercises.  Pt's husband reported pt was not doing as well today as she was in previous session on Monday this week.  He reports pt is teetering on hepatic encephalopathy exacerbation.  Pt has met STG's #1, 3 and 5:  STG #2 not met as TUG score = 21.97 secs with SPC in today's session, 19.38 secs at initial eval 4 weeks ago.  STG #4 is partially met as pt is amb. In home without use of SPC at times but uses counters, furniture, walls, etc. For assist with balance prn.  STG #6 is ongoing as HEP is ongoing.  Compliance with HEP has been limited due to pt having had shingles 2 weeks ago.  Cont with POC.   OBJECTIVE IMPAIRMENTS: decreased activity tolerance, decreased balance, decreased endurance, and difficulty walking.   ACTIVITY LIMITATIONS: carrying, lifting, bending, squatting, stairs, and locomotion level  PARTICIPATION LIMITATIONS: meal prep, cleaning, laundry, driving, shopping, and community activity  PERSONAL FACTORS: Behavior pattern, Past/current experiences, Time since onset of injury/illness/exacerbation, and 1-2 comorbidities: h/o hepatic encephalopathy with multiple hospitalizations and h/o CVA are also affecting patient's functional outcome.   REHAB POTENTIAL: Good  CLINICAL DECISION MAKING: Evolving/moderate complexity  EVALUATION COMPLEXITY: Moderate  PLAN:  PT FREQUENCY: 2x/week  PT DURATION: 8 weeks  PLANNED  INTERVENTIONS: 97110-Therapeutic exercises, 97530- Therapeutic activity, 97112- Neuromuscular re-education, [phone]- Self Care, [id number]- Gait training, and Patient/Family education  PLAN FOR NEXT SESSION:  continue balance and gait training, LE strengthening; endurance training   Quintasia Theroux, [place], PT 09/17/2024, 6:41 PM

## 2024-09-17 ENCOUNTER — Encounter: Payer: Self-pay | Admitting: Physical Therapy

## 2024-09-18 ENCOUNTER — Encounter: Payer: Self-pay | Admitting: Physical Therapy

## 2024-09-18 ENCOUNTER — Ambulatory Visit: Admitting: Physical Therapy

## 2024-09-18 DIAGNOSIS — R5381 Other malaise: Secondary | ICD-10-CM

## 2024-09-18 DIAGNOSIS — R2689 Other abnormalities of gait and mobility: Secondary | ICD-10-CM | POA: Diagnosis not present

## 2024-09-18 DIAGNOSIS — R262 Difficulty in walking, not elsewhere classified: Secondary | ICD-10-CM

## 2024-09-18 DIAGNOSIS — M6281 Muscle weakness (generalized): Secondary | ICD-10-CM

## 2024-09-18 DIAGNOSIS — R2681 Unsteadiness on feet: Secondary | ICD-10-CM

## 2024-09-18 DIAGNOSIS — R278 Other lack of coordination: Secondary | ICD-10-CM

## 2024-09-18 NOTE — Therapy (Signed)
 OUTPATIENT PHYSICAL THERAPY NEURO TREATMENT NOTE   Patient Name: [name] MRN: [id number] DOB:16-Oct-1951, 73 y.o., female Today's Date: 09/18/2024   PCP: [name], NP REFERRING PROVIDER: [name], NP  END OF SESSION:  PT End of Session - 09/18/24 1407     Visit Number 7    Number of Visits 17    Date for Recertification  10/06/24    Authorization Type [employer] - Auth. required    Authorization Time Period 08-08-24 - 10-03-24    Authorization - Visit Number 7    Authorization - Number of Visits 17    Progress Note Due on Visit 10    PT Start Time 1403    PT Stop Time 1449    PT Time Calculation (min) 46 min    Equipment Utilized During Treatment Gait belt    Activity Tolerance Patient tolerated treatment well   c/o Rt hip pain which appears to be consistent with bursitis   Behavior During Therapy [facility] for tasks assessed/performed             Past Medical History:  Diagnosis Date   Acute hepatic encephalopathy (HCC) 03/30/2024   Acute urinary retention 05/04/2022   Allergy 1952   Contrast dye   Arthritis    Knees and thumb   Blood transfusion without reported diagnosis    Cancer [facility])    cecum   Cataract    Surgery scheduled July 2023   Colon cancer (HCC) 2003   Elevated liver function tests    Esophageal varices (HCC)    Heart murmur    Hemorrhage of gastrointestinal tract 05/04/2011   Hepatic encephalopathy (HCC) 2025   Hypertension    Hypothyroidism    Iron deficiency anemia    Liver disease    chemotherapy complication, per pt, shunts placed to bypass liver   Malignant neoplasm of cecum (HCC)    Myocardial infarction [facility]) Jan 2025   Portal hypertension (HCC)    Skin cancer 2019   Splenomegaly    Stroke [facility])    Ulcer 05/2024   Past Surgical History:  Procedure Laterality Date   COLON SURGERY     Cancer   COSMETIC SURGERY     Skin cancer   ESOPHAGEAL VARICE LIGATION     ESOPHAGOGASTRODUODENOSCOPY N/A 05/28/2024    Procedure: EGD (ESOPHAGOGASTRODUODENOSCOPY);  Surgeon: Albertus Gordy HERO, MD;  Location: [facility];  Service: Gastroenterology;  Laterality: N/A;   EYE SURGERY     For cataracts   HEMICOLECTOMY  01/08/2003   IR RADIOLOGIST EVAL & MGMT  12/20/2020   IR RADIOLOGIST EVAL & MGMT  05/29/2021   LIVER SURGERY     shunts placed after chemo complication   SKIN FULL THICKNESS GRAFT N/A 09/12/2019   Procedure: debridement and FTSG to the nose from left upper arm;  Surgeon: Elisabeth Craig RAMAN, MD;  Location: [facility];  Service: Plastics;  Laterality: N/A;  2 hours, please   TIPS PROCEDURE     Patient Active Problem List   Diagnosis Date Noted   AMS (altered mental status) 07/01/2024   Heme positive stool 05/28/2024   Acute gastric ulcer without hemorrhage or perforation 05/28/2024   Gastritis and gastroduodenitis 05/28/2024   Acute on chronic anemia 05/27/2024   SIRS (systemic inflammatory response syndrome) (HCC) 05/21/2024   Acute prerenal azotemia 05/21/2024   Paroxysmal atrial fibrillation (HCC) 05/21/2024   Acute metabolic encephalopathy 05/20/2024   Vision disturbance 05/11/2024   Patent foramen ovale 04/16/2024  Acute CVA (cerebrovascular accident) (HCC) 04/15/2024   Abnormal CBC 04/10/2024   Hyperkalemia 04/05/2024   Hypoglycemia 04/05/2024   Macrocytic anemia 04/05/2024   Cerebrovascular accident (CVA) (HCC) 03/16/2024   History of stroke 01/06/2024   Thrombocytopenia 12/21/2023   Decreased GFR 12/10/2023   Preventative health care 09/09/2023   Lower extremity edema 08/10/2023   Cellulitis of both lower extremities 08/04/2023   Altered mental status 08/04/2023   Bradycardia 05/07/2023   Protein-calorie malnutrition, severe 02/26/2023   Weight loss 02/24/2023   Other constipation 02/24/2023   Generalized abdominal pain 02/24/2023   Hospital discharge follow-up 02/24/2023   Weakness 01/04/2023   Confusion 08/26/2022   Frequent UTI 05/18/2022   Anemia of chronic  disease 05/18/2022   AKI (acute kidney injury) 04/30/2022   Tachycardia-bradycardia syndrome (HCC) 02/05/2022   Hypomagnesemia 02/04/2022   Hepatic encephalopathy (HCC) 02/03/2022   Basal cell carcinoma (BCC) 05/06/2021   Hypertension 05/06/2021   Cirrhosis (HCC) 12/04/2020   Murmur, cardiac 03/19/2020   Acquired hypothyroidism 06/30/2019   History of basal cell cancer 06/30/2019   Hx of colon cancer, stage III 11/30/2011   Portal hypertension (HCC) 01/23/2008    ONSET DATE: 07-01-24 for most recent hospitalization  REFERRING DIAG:  Diagnosis  K76.82 (ICD-10-CM) - Hepatic encephalopathy (HCC)  R53.1 (ICD-10-CM) - Weakness    THERAPY DIAG:  Muscle weakness (generalized)  Unsteadiness on feet  Other abnormalities of gait and mobility  Difficulty in walking, not elsewhere classified  Other lack of coordination  Physical deconditioning  Rationale for Evaluation and Treatment: Rehabilitation  SUBJECTIVE:                                                                                                                                                                                             SUBJECTIVE STATEMENT: Patient reports she is feeling better today, but it is cold!; pt denies falls or acute status changes.  Pt accompanied by: spouse, [name]  PERTINENT HISTORY:  Per chart note 07-01-24; past medical history significant for cirrhosis, prior hepatic encephalopathy, acquired hypothyroidism, presented to hospital with altered mental status and elevated ammonia level for 5 days with generalized weakness.  hospitalized 07-01-24 - 07-03-24 with hepatic encephalopathy PMHx of  Stroke Hypothyroidism Cecal adenocarcinoma, s/p resection and chemotherapy in 2004 Liver disease, cirrhosis,  Skin Cancer  Hypertension  PAIN:   Are you having pain? Yes: NPRS scale: no - 0/10 Pain location: Rt lateral hip  Pain description: throbbing, sore Aggravating factors: prolonged  standing/walking; have to be careful how I lay - able to lie on Rt side if heat is on it Relieving factors: [facility] &  heat   PRECAUTIONS: Fall  RED FLAGS: None   WEIGHT BEARING RESTRICTIONS: No  FALLS: Has patient fallen in last 6 months? No  LIVING ENVIRONMENT: Lives with: lives with their spouse Lives in: House/apartment Stairs: No Has following equipment at home: Ramped entry; SPC, RW, shower chair, manual wheelchair, BSC  PLOF: Independent with basic ADLs, Independent with household mobility with device, Independent with transfers, and Needs assistance with homemaking  PATIENT GOALS: walk without my cane; increase energy   OBJECTIVE:  Note: Objective measures were completed at Evaluation unless otherwise noted.  DIAGNOSTIC FINDINGS: CT scan 06-19-24 IMPRESSION: 1. No evidence of acute intracranial abnormality. 2. Similar prior left PCA territory infarct and chronic microvascular ischemic change.  COGNITION: Overall cognitive status: Within functional limits for tasks assessed   SENSATION: Not tested  COORDINATION: WFL's bil. LE's    POSTURE: rounded shoulders, forward head, and increased thoracic kyphosis  LOWER EXTREMITY ROM:   WFL's bil. LE's   LOWER EXTREMITY MMT:    MMT Right Eval Left Eval  Hip flexion 4 4  Hip extension    Hip abduction    Hip adduction    Hip internal rotation    Hip external rotation    Knee flexion 4 4  Knee extension 5 5  Ankle dorsiflexion 5 5  Ankle plantarflexion    Ankle inversion    Ankle eversion    (Blank rows = not tested)  BED MOBILITY:  Not tested =pt reports independent with bed mobility  TRANSFERS: Sit to stand: Modified independence  Assistive device utilized: bil. Armrests of chair     Stand to sit: Modified independence  Assistive device utilized: bil. Armrests of chair      RAMP:  Not tested  CURB:  Not tested  STAIRS: Not tested GAIT: Findings: Gait Characteristics: step through pattern,  decreased step length- Right, and decreased step length- Left, Distance walked: 75', Assistive device utilized:Single point cane, Level of assistance: CGA, and Comments: SPC with small quad tip used during eval; pt reports she is beginning to ambulate very short distances in home without use of SPC; slow gait speed   FUNCTIONAL TESTS:  5 times sit to stand: 35.88 secs without UE support - from chair Timed up and go (TUG): 19.38 secs with SPC 10 meter walk test:  20.65 secs = 1.59 ft/sec                                                                                                                                TREATMENT: 09-18-24  TherAct: -Unsupported airex stance SBA x13.24 seconds for 30 piece puzzle completion w/ mod cues for orientation of pieces - no LOB or significant sway - completed for speed, dual tasking, problem solving, and prolonged gastroc/soleus activation  TherEx: 5-Minute Seated Cardio Blast - Silver [employer] Program (Try to do each combo continuously and take rest in between each set if needed.  You may alternate sides  with each exercise to help manage fatigue with movement.)  Upper Body Combo: Sit tall in your chair and perform each move 4x. Shoulders lift up toward ears and then release down. Shoulders roll front and then back - you can use your elbows to progress movement as needed. Cross arms, then open arms. Arms reach front, then pull.  Lower Body Combo: Start this circuit with a stretch to loosen up your shoulders.  Roll your left shoulder back, then roll your right shoulder back and repeat on each side 3 times.   Perform these alternating heel taps 8x each. Tap your heel to the front, then back.  Swing the opposite arm forward with each tap. Tap your heel to the front, then back.  Extend opposite arm up toward the sky with each tap.  Side Tap Combo: Sit tall in your chair and alternate side-to-side toe taps 8x each. Alternate heel taps from side to  side. Alternate heel taps from side to side and cross arms in front of you, then pull arms back.  Reach and Pull: Perform this move 16x (can do both or single arm at a time) Bring your elbows up, reach toward the front of the room, pull arms back and bring elbows down.  Single Leg Pump - Right Side: Perform this move 8x each. Pick up the speed and tap your right heel forward then back.  Pump your arms as you move.  Double Side Tap: Tap your right toe out to the right side twice.  Bring your right arm across your body to push toward the opposite side of the room with each tap.  Then, alternate the double toe tap on each side 8x.  Double Arm Pull Down: Reach both arms up toward the sky, then pull arms down to sides.  As you pull down, squeeze your shoulder blades together and gently lift your heels from the floor.  Repeat this move 16x.  Single Leg Pump - Left Side: Perform these moves 8x. Pick up the speed and tap your left heel forward, then back.  Pump your arms as you move.  Fast Feet: Alternate tapping your toes to the ground in small, quick movements.  Repeat 16x.  Torso Twist: Repeat this move 16x. Twist your upper body to the right, then to the left.  PATIENT EDUCATION: Education details: HEP review and practice - husband inquires about airex pad for home, instructed they could practice standing on this at counter w/ close supervision.  Seated aerobics program maybe 1x per week - husband to help demo to patient. Person educated: Patient and Spouse Education method: Explanation, Demonstration, and Handouts Education comprehension: verbalized understanding and needs further education  HOME EXERCISE PROGRAM: See above - [employer] [id number]  5-Minute Seated Cardio Blast - Silver Orthoptist Program (Try to do each combo continuously and take rest in between each set if needed.  You may alternate sides with each exercise to help manage fatigue with movement.)  Upper Body  Combo: Sit tall in your chair and perform each move 4x. Shoulders lift up toward ears and then release down. Shoulders roll front and then back - you can use your elbows to progress movement as needed. Cross arms, then open arms. Arms reach front, then pull.  Lower Body Combo: Start this circuit with a stretch to loosen up your shoulders.  Roll your left shoulder back, then roll your right shoulder back and repeat on each side 3 times.   Perform these alternating heel taps  8x each. Tap your heel to the front, then back.  Swing the opposite arm forward with each tap. Tap your heel to the front, then back.  Extend opposite arm up toward the sky with each tap.  Side Tap Combo: Sit tall in your chair and alternate side-to-side toe taps 8x each. Alternate heel taps from side to side. Alternate heel taps from side to side and cross arms in front of you, then pull arms back.  Reach and Pull: Perform this move 16x (can do both or single arm at a time) Bring your elbows up, reach toward the front of the room, pull arms back and bring elbows down.  Single Leg Pump - Right Side: Perform this move 8x each. Pick up the speed and tap your right heel forward then back.  Pump your arms as you move.  Double Side Tap: Tap your right toe out to the right side twice.  Bring your right arm across your body to push toward the opposite side of the room with each tap.  Then, alternate the double toe tap on each side 8x.  Double Arm Pull Down: Reach both arms up toward the sky, then pull arms down to sides.  As you pull down, squeeze your shoulder blades together and gently lift your heels from the floor.  Repeat this move 16x.  Single Leg Pump - Left Side: Perform these moves 8x. Pick up the speed and tap your left heel forward, then back.  Pump your arms as you move.  Fast Feet: Alternate tapping your toes to the ground in small, quick movements.  Repeat 16x.  Torso Twist: Repeat this move  16x. Twist your upper body to the right, then to the left.  GOALS: Goals reviewed with patient? Yes  SHORT TERM GOALS: Target date: 09-08-24  Improve 5x sit to stand transfer score to </= 28 secs without UE support from chair to demo improved LE strength.  Baseline:  5 times sit to stand: 35.88 secs without UE support - from chair; 09-14-24: 23.31 secs without UE support Goal status: MET 09-14-24  2.  Improve TUG score to </= 16 secs with SPC to demonstrate reduced fall risk. Baseline: 19.38 secs with SPC;   09-14-24 - 21.97 secs   Goal status: Not met 09-14-24  3.  Increase gait velocity to >/= 1.9 ft/sec with use of SPC for increased gait efficiency. Baseline:  10 meter walk test:  20.65 secs = 1.59 ft/sec with SPC ;  16.57, 14.53 = 2.26 ft/sec with SPC Goal status: Goal met 09-14-24  4.  Ambulate 69' without use of SPC with supervision for household ambulation.  Baseline:  Goal status: Partially met 09-14-24  5.   Amb. 2 nonstop with use of SPC with SBA to CGA to demonstrate improved endurance/activity tolerance. Baseline: 2:03 - RPE 4-5/10 Goal status: MET 09-14-24  6.  Independent in HEP for balance and LE strengthening.  Baseline:  Goal status: Ongoing 09-14-24    LONG TERM GOALS: Target date: 10-06-24  Improve 5x sit to stand transfer score to </= 22 secs without UE support from chair to demo improved LE strength.  Baseline:  5 times sit to stand: 35.88 secs without UE support - from chair Goal status: INITIAL  2.  Improve TUG score to </= 13.5 secs with SPC to demonstrate reduced fall risk. Baseline: 19.38 secs with SPC Goal status: INITIAL  3.  Increase Berg balance test score by at least 5 points to demo improved  balance and decreased fall risk.  Baseline: TBA Goal status: INITIAL  4.  Pt will report modified independent household ambulation without use of SPC. Baseline:  Goal status: INITIAL  5.  Increase gait velocity to >/= 2.4 ft/sec with use of SPC  for increased gait efficiency. Baseline:  10 meter walk test:  20.65 secs = 1.59 ft/sec with SPC  Goal status: INITIAL  6.  Amb. 4 nonstop with use of SPC with SBA for increased community accessibility.  Baseline:  Goal status: INITIAL  ASSESSMENT:  CLINICAL IMPRESSION: Pt seen today for skilled PT session focused on endurance, coordination, and dual tasking with static balance.  She demonstrates good standing tolerance on compliance surface but needs moderate cues to problem solve puzzle.  Initiated seated aerobics program that husband was very interested in doing with pt at home so copy provided.  Will check in to ensure coordination is going better with this program.  Continue per POC.  OBJECTIVE IMPAIRMENTS: decreased activity tolerance, decreased balance, decreased endurance, and difficulty walking.   ACTIVITY LIMITATIONS: carrying, lifting, bending, squatting, stairs, and locomotion level  PARTICIPATION LIMITATIONS: meal prep, cleaning, laundry, driving, shopping, and community activity  PERSONAL FACTORS: Behavior pattern, Past/current experiences, Time since onset of injury/illness/exacerbation, and 1-2 comorbidities: h/o hepatic encephalopathy with multiple hospitalizations and h/o CVA are also affecting patient's functional outcome.   REHAB POTENTIAL: Good  CLINICAL DECISION MAKING: Evolving/moderate complexity  EVALUATION COMPLEXITY: Moderate  PLAN:  PT FREQUENCY: 2x/week  PT DURATION: 8 weeks  PLANNED INTERVENTIONS: 97110-Therapeutic exercises, 97530- Therapeutic activity, 97112- Neuromuscular re-education, [phone]- Self Care, [id number]- Gait training, and Patient/Family education  PLAN FOR NEXT SESSION:  continue balance and gait training, LE strengthening; endurance training; how is seated aerobics program?   Daved [name], PT, DPT 09/18/2024, 5:00 PM

## 2024-09-18 NOTE — Patient Instructions (Signed)
 5-Minute Seated Cardio Blast - Silver Personal Assistant (Try to do each combo continuously and take rest in between each set if needed.  You may alternate sides with each exercise to help manage fatigue with movement.)  Upper Body Combo: Sit tall in your chair and perform each move 4x. Shoulders lift up toward ears and then release down. Shoulders roll front and then back - you can use your elbows to progress movement as needed. Cross arms, then open arms. Arms reach front, then pull.  Lower Body Combo: Start this circuit with a stretch to loosen up your shoulders.  Roll your left shoulder back, then roll your right shoulder back and repeat on each side 3 times.   Perform these alternating heel taps 8x each. Tap your heel to the front, then back.  Swing the opposite arm forward with each tap. Tap your heel to the front, then back.  Extend opposite arm up toward the sky with each tap.  Side Tap Combo: Sit tall in your chair and alternate side-to-side toe taps 8x each. Alternate heel taps from side to side. Alternate heel taps from side to side and cross arms in front of you, then pull arms back.  Reach and Pull: Perform this move 16x (can do both or single arm at a time) Bring your elbows up, reach toward the front of the room, pull arms back and bring elbows down.  Single Leg Pump - Right Side: Perform this move 8x each. Pick up the speed and tap your right heel forward then back.  Pump your arms as you move.  Double Side Tap: Tap your right toe out to the right side twice.  Bring your right arm across your body to push toward the opposite side of the room with each tap.  Then, alternate the double toe tap on each side 8x.  Double Arm Pull Down: Reach both arms up toward the sky, then pull arms down to sides.  As you pull down, squeeze your shoulder blades together and gently lift your heels from the floor.  Repeat this move 16x.  Single Leg Pump - Left Side: Perform these  moves 8x. Pick up the speed and tap your left heel forward, then back.  Pump your arms as you move.  Fast Feet: Alternate tapping your toes to the ground in small, quick movements.  Repeat 16x.  Torso Twist: Repeat this move 16x. Twist your upper body to the right, then to the left.

## 2024-09-21 ENCOUNTER — Ambulatory Visit: Admitting: Physical Therapy

## 2024-09-21 DIAGNOSIS — R2681 Unsteadiness on feet: Secondary | ICD-10-CM

## 2024-09-21 DIAGNOSIS — M6281 Muscle weakness (generalized): Secondary | ICD-10-CM

## 2024-09-21 DIAGNOSIS — R2689 Other abnormalities of gait and mobility: Secondary | ICD-10-CM | POA: Diagnosis not present

## 2024-09-21 NOTE — Therapy (Unsigned)
 OUTPATIENT PHYSICAL THERAPY NEURO TREATMENT NOTE   Patient Name: [name] MRN: [id number] DOB:January 10, 1951, 73 y.o., female Today's Date: 09/22/2024   PCP: [name], NP REFERRING PROVIDER: [name], NP  END OF SESSION:  PT End of Session - 09/22/24 0956     Visit Number 8    Number of Visits 17    Date for Recertification  10/06/24    Authorization Type [employer] - Auth. required    Authorization Time Period 08-08-24 - 10-03-24    Authorization - Visit Number 8    Authorization - Number of Visits 17    Progress Note Due on Visit 10    PT Start Time 1355    PT Stop Time 1445    PT Time Calculation (min) 50 min    Equipment Utilized During Treatment Gait belt    Activity Tolerance Patient tolerated treatment well   c/o Rt hip pain which appears to be consistent with bursitis   Behavior During Therapy [facility] for tasks assessed/performed              Past Medical History:  Diagnosis Date   Acute hepatic encephalopathy (HCC) 03/30/2024   Acute urinary retention 05/04/2022   Allergy 1952   Contrast dye   Arthritis    Knees and thumb   Blood transfusion without reported diagnosis    Cancer [facility])    cecum   Cataract    Surgery scheduled July 2023   Colon cancer (HCC) 2003   Elevated liver function tests    Esophageal varices (HCC)    Heart murmur    Hemorrhage of gastrointestinal tract 05/04/2011   Hepatic encephalopathy (HCC) 2025   Hypertension    Hypothyroidism    Iron deficiency anemia    Liver disease    chemotherapy complication, per pt, shunts placed to bypass liver   Malignant neoplasm of cecum (HCC)    Myocardial infarction [facility]) Jan 2025   Portal hypertension (HCC)    Skin cancer 2019   Splenomegaly    Stroke [facility])    Ulcer 05/2024   Past Surgical History:  Procedure Laterality Date   COLON SURGERY     Cancer   COSMETIC SURGERY     Skin cancer   ESOPHAGEAL VARICE LIGATION     ESOPHAGOGASTRODUODENOSCOPY N/A 05/28/2024    Procedure: EGD (ESOPHAGOGASTRODUODENOSCOPY);  Surgeon: Albertus Gordy HERO, MD;  Location: [facility];  Service: Gastroenterology;  Laterality: N/A;   EYE SURGERY     For cataracts   HEMICOLECTOMY  01/08/2003   IR RADIOLOGIST EVAL & MGMT  12/20/2020   IR RADIOLOGIST EVAL & MGMT  05/29/2021   LIVER SURGERY     shunts placed after chemo complication   SKIN FULL THICKNESS GRAFT N/A 09/12/2019   Procedure: debridement and FTSG to the nose from left upper arm;  Surgeon: Elisabeth Craig RAMAN, MD;  Location: [facility];  Service: Plastics;  Laterality: N/A;  2 hours, please   TIPS PROCEDURE     Patient Active Problem List   Diagnosis Date Noted   AMS (altered mental status) 07/01/2024   Heme positive stool 05/28/2024   Acute gastric ulcer without hemorrhage or perforation 05/28/2024   Gastritis and gastroduodenitis 05/28/2024   Acute on chronic anemia 05/27/2024   SIRS (systemic inflammatory response syndrome) (HCC) 05/21/2024   Acute prerenal azotemia 05/21/2024   Paroxysmal atrial fibrillation (HCC) 05/21/2024   Acute metabolic encephalopathy 05/20/2024   Vision disturbance 05/11/2024   Patent foramen ovale  04/16/2024   Acute CVA (cerebrovascular accident) (HCC) 04/15/2024   Abnormal CBC 04/10/2024   Hyperkalemia 04/05/2024   Hypoglycemia 04/05/2024   Macrocytic anemia 04/05/2024   Cerebrovascular accident (CVA) (HCC) 03/16/2024   History of stroke 01/06/2024   Thrombocytopenia 12/21/2023   Decreased GFR 12/10/2023   Preventative health care 09/09/2023   Lower extremity edema 08/10/2023   Cellulitis of both lower extremities 08/04/2023   Altered mental status 08/04/2023   Bradycardia 05/07/2023   Protein-calorie malnutrition, severe 02/26/2023   Weight loss 02/24/2023   Other constipation 02/24/2023   Generalized abdominal pain 02/24/2023   Hospital discharge follow-up 02/24/2023   Weakness 01/04/2023   Confusion 08/26/2022   Frequent UTI 05/18/2022   Anemia of chronic  disease 05/18/2022   AKI (acute kidney injury) 04/30/2022   Tachycardia-bradycardia syndrome (HCC) 02/05/2022   Hypomagnesemia 02/04/2022   Hepatic encephalopathy (HCC) 02/03/2022   Basal cell carcinoma (BCC) 05/06/2021   Hypertension 05/06/2021   Cirrhosis (HCC) 12/04/2020   Murmur, cardiac 03/19/2020   Acquired hypothyroidism 06/30/2019   History of basal cell cancer 06/30/2019   Hx of colon cancer, stage III 11/30/2011   Portal hypertension (HCC) 01/23/2008    ONSET DATE: 07-01-24 for most recent hospitalization  REFERRING DIAG:  Diagnosis  K76.82 (ICD-10-CM) - Hepatic encephalopathy (HCC)  R53.1 (ICD-10-CM) - Weakness    THERAPY DIAG:  Unsteadiness on feet  Other abnormalities of gait and mobility  Muscle weakness (generalized)  Rationale for Evaluation and Treatment: Rehabilitation  SUBJECTIVE:                                                                                                                                                                                             SUBJECTIVE STATEMENT: Patient reports she has had a very busy week; has not really had time to do the exercises she was given on Monday (Silver Sneakers exs.) but thinks they are going to be helpful  Pt accompanied by: spouse, [name]  PERTINENT HISTORY:  Per chart note 07-01-24; past medical history significant for cirrhosis, prior hepatic encephalopathy, acquired hypothyroidism, presented to hospital with altered mental status and elevated ammonia level for 5 days with generalized weakness.  hospitalized 07-01-24 - 07-03-24 with hepatic encephalopathy PMHx of  Stroke Hypothyroidism Cecal adenocarcinoma, s/p resection and chemotherapy in 2004 Liver disease, cirrhosis,  Skin Cancer  Hypertension  PAIN:   Are you having pain? Yes: NPRS scale: no - 0/10 Pain location: Rt lateral hip  Pain description: throbbing, sore Aggravating factors: prolonged standing/walking; have to be careful how I  lay - able to lie on Rt side if heat is on it Relieving  factors: [place] & heat   PRECAUTIONS: Fall  RED FLAGS: None   WEIGHT BEARING RESTRICTIONS: No  FALLS: Has patient fallen in last 6 months? No  LIVING ENVIRONMENT: Lives with: lives with their spouse Lives in: House/apartment Stairs: No Has following equipment at home: Ramped entry; SPC, RW, shower chair, manual wheelchair, BSC  PLOF: Independent with basic ADLs, Independent with household mobility with device, Independent with transfers, and Needs assistance with homemaking  PATIENT GOALS: walk without my cane; increase energy   OBJECTIVE:  Note: Objective measures were completed at Evaluation unless otherwise noted.  DIAGNOSTIC FINDINGS: CT scan 06-19-24 IMPRESSION: 1. No evidence of acute intracranial abnormality. 2. Similar prior left PCA territory infarct and chronic microvascular ischemic change.  COGNITION: Overall cognitive status: Within functional limits for tasks assessed   SENSATION: Not tested  COORDINATION: WFL's bil. LE's    POSTURE: rounded shoulders, forward head, and increased thoracic kyphosis  LOWER EXTREMITY ROM:   WFL's bil. LE's   LOWER EXTREMITY MMT:    MMT Right Eval Left Eval  Hip flexion 4 4  Hip extension    Hip abduction    Hip adduction    Hip internal rotation    Hip external rotation    Knee flexion 4 4  Knee extension 5 5  Ankle dorsiflexion 5 5  Ankle plantarflexion    Ankle inversion    Ankle eversion    (Blank rows = not tested)  BED MOBILITY:  Not tested =pt reports independent with bed mobility  TRANSFERS: Sit to stand: Modified independence  Assistive device utilized: bil. Armrests of chair     Stand to sit: Modified independence  Assistive device utilized: bil. Armrests of chair      RAMP:  Not tested  CURB:  Not tested  STAIRS: Not tested GAIT: Findings: Gait Characteristics: step through pattern, decreased step length- Right, and decreased  step length- Left, Distance walked: 75', Assistive device utilized:Single point cane, Level of assistance: CGA, and Comments: SPC with small quad tip used during eval; pt reports she is beginning to ambulate very short distances in home without use of SPC; slow gait speed   FUNCTIONAL TESTS:  5 times sit to stand: 35.88 secs without UE support - from chair Timed up and go (TUG): 19.38 secs with SPC 10 meter walk test:  20.65 secs = 1.59 ft/sec                                                                                                                                TREATMENT: 09-21-24  TherEx: Sit to stand transfers from mat without UE support - 5 times - feet on floor  Gait: Pt amb. 115' without device with CGA around track in clinic gym  Clinic distances without use of SPC with CGA to SBA  TherAct: Pt performed 4 square stepping + with CGA for stepping forward, laterally and min assist for balance with stepping backwards -  2 reps performed - no device used Pt performed kicking 4 bean bags - alternating feet- to facilitate improved balance with SLS - approx. 20' with CGA Pt stood by mat - tossed and caught medium sized ball - husband assisted with this activity with PT providing CGA; progressed to pt standing on [employer] in corner - tossed and caught ball - husband tossing ball, PT providing CGA to min assist for balance recovery Pt performed cone taps to 5 cones with min HHA for balance recovery - 1 rep each foot for tapping cone; then performed fig. 8's around cones without device for improved balance with turning Pt performed stepping up onto [employer] step, then stepping down with 180 degree turn to improve balance with turning and for increased LE strength- - 6 reps total - 3 reps with RLE leading and 3 reps with LLE leading; CGA with UE support on counter prn Pt performed alternating tap ups to this step (4) 5 reps each LE with CGA/min assist without UE support    PATIENT  EDUCATION: Education details:  emphasized need to continue with standing balance exercises (balance HEP previously issued) to work on improving balance and SLS Person educated: Patient and Spouse Education method: Explanation, Facilities Manager, and Handouts Education comprehension: verbalized understanding and needs further education  HOME EXERCISE PROGRAM: See above - [employer] [id number]  5-Minute Seated Cardio Blast - Silver Orthoptist Program (Try to do each combo continuously and take rest in between each set if needed.  You may alternate sides with each exercise to help manage fatigue with movement.)  Upper Body Combo: Sit tall in your chair and perform each move 4x. Shoulders lift up toward ears and then release down. Shoulders roll front and then back - you can use your elbows to progress movement as needed. Cross arms, then open arms. Arms reach front, then pull.  Lower Body Combo: Start this circuit with a stretch to loosen up your shoulders.  Roll your left shoulder back, then roll your right shoulder back and repeat on each side 3 times.   Perform these alternating heel taps 8x each. Tap your heel to the front, then back.  Swing the opposite arm forward with each tap. Tap your heel to the front, then back.  Extend opposite arm up toward the sky with each tap.  Side Tap Combo: Sit tall in your chair and alternate side-to-side toe taps 8x each. Alternate heel taps from side to side. Alternate heel taps from side to side and cross arms in front of you, then pull arms back.  Reach and Pull: Perform this move 16x (can do both or single arm at a time) Bring your elbows up, reach toward the front of the room, pull arms back and bring elbows down.  Single Leg Pump - Right Side: Perform this move 8x each. Pick up the speed and tap your right heel forward then back.  Pump your arms as you move.  Double Side Tap: Tap your right toe out to the right side twice.  Bring your  right arm across your body to push toward the opposite side of the room with each tap.  Then, alternate the double toe tap on each side 8x.  Double Arm Pull Down: Reach both arms up toward the sky, then pull arms down to sides.  As you pull down, squeeze your shoulder blades together and gently lift your heels from the floor.  Repeat this move 16x.  Single Leg Pump - Left Side: Perform  these moves 8x. Pick up the speed and tap your left heel forward, then back.  Pump your arms as you move.  Fast Feet: Alternate tapping your toes to the ground in small, quick movements.  Repeat 16x.  Torso Twist: Repeat this move 16x. Twist your upper body to the right, then to the left.  GOALS: Goals reviewed with patient? Yes  SHORT TERM GOALS: Target date: 09-08-24  Improve 5x sit to stand transfer score to </= 28 secs without UE support from chair to demo improved LE strength.  Baseline:  5 times sit to stand: 35.88 secs without UE support - from chair; 09-14-24: 23.31 secs without UE support Goal status: MET 09-14-24  2.  Improve TUG score to </= 16 secs with SPC to demonstrate reduced fall risk. Baseline: 19.38 secs with SPC;   09-14-24 - 21.97 secs   Goal status: Not met 09-14-24  3.  Increase gait velocity to >/= 1.9 ft/sec with use of SPC for increased gait efficiency. Baseline:  10 meter walk test:  20.65 secs = 1.59 ft/sec with SPC ;  16.57, 14.53 = 2.26 ft/sec with SPC Goal status: Goal met 09-14-24  4.  Ambulate 55' without use of SPC with supervision for household ambulation.  Baseline:  Goal status: Partially met 09-14-24  5.   Amb. 2 nonstop with use of SPC with SBA to CGA to demonstrate improved endurance/activity tolerance. Baseline: 2:03 - RPE 4-5/10 Goal status: MET 09-14-24  6.  Independent in HEP for balance and LE strengthening.  Baseline:  Goal status: Ongoing 09-14-24    LONG TERM GOALS: Target date: 10-06-24  Improve 5x sit to stand transfer score to </= 22  secs without UE support from chair to demo improved LE strength.  Baseline:  5 times sit to stand: 35.88 secs without UE support - from chair Goal status: INITIAL  2.  Improve TUG score to </= 13.5 secs with SPC to demonstrate reduced fall risk. Baseline: 19.38 secs with SPC Goal status: INITIAL  3.  Increase Berg balance test score by at least 5 points to demo improved balance and decreased fall risk.  Baseline: TBA Goal status: INITIAL  4.  Pt will report modified independent household ambulation without use of SPC. Baseline:  Goal status: INITIAL  5.  Increase gait velocity to >/= 2.4 ft/sec with use of SPC for increased gait efficiency. Baseline:  10 meter walk test:  20.65 secs = 1.59 ft/sec with SPC  Goal status: INITIAL  6.  Amb. 4 nonstop with use of SPC with SBA for increased community accessibility.  Baseline:  Goal status: INITIAL  ASSESSMENT:  CLINICAL IMPRESSION: PT session focused on gait training without use of SPC and standing balance activities to improve SLS on each leg and balance with turning.  Pt requested seated rest period due to fatigue after amb. 115' without use of SPC with CGA.  Pt needs UE support for safety with SLS activities;  pt needed min assist with stepping backwards (over yardstick on floor), as pt reported she had not performed backward stepping in many years.  Pt tolerated exercises well but did report fatigue at end of session.  Cont with POC.   OBJECTIVE IMPAIRMENTS: decreased activity tolerance, decreased balance, decreased endurance, and difficulty walking.   ACTIVITY LIMITATIONS: carrying, lifting, bending, squatting, stairs, and locomotion level  PARTICIPATION LIMITATIONS: meal prep, cleaning, laundry, driving, shopping, and community activity  PERSONAL FACTORS: Behavior pattern, Past/current experiences, Time since onset of injury/illness/exacerbation, and 1-2 comorbidities:  h/o hepatic encephalopathy with multiple hospitalizations and  h/o CVA are also affecting patient's functional outcome.   REHAB POTENTIAL: Good  CLINICAL DECISION MAKING: Evolving/moderate complexity  EVALUATION COMPLEXITY: Moderate  PLAN:  PT FREQUENCY: 2x/week  PT DURATION: 8 weeks  PLANNED INTERVENTIONS: 97110-Therapeutic exercises, 97530- Therapeutic activity, 97112- Neuromuscular re-education, [phone]- Self Care, [id number]- Gait training, and Patient/Family education  PLAN FOR NEXT SESSION:  continue balance and gait training, LE strengthening; endurance training; how is seated aerobics program?   [place]anna [place], PT 09/22/2024, 10:57 AM

## 2024-09-22 ENCOUNTER — Encounter: Payer: Self-pay | Admitting: Physical Therapy

## 2024-09-22 NOTE — Progress Notes (Signed)
 Cardiology Office Note   Date:  10/02/2024  ID:  Mckenize, Mezera 02/24/1951, MRN [id number] PCP: [name], NP  [facility] Providers Cardiologist:  Stanly DELENA Leavens, MD     History of Present Illness [name] is a 73 y.o. female with a past medical history of hypertension, paroxysmal atrial fibrillation, PFO, history of stroke, history of colon cancer s/p colectomy with chemo in 2003, history of gastric ulcer, cirrhosis s/p TIPS procedure 2012, frequent UTIs, thrombocytopenia, mild bilateral carotid artery stenosis.  06/04/2024 monitor A-fib burden 1% 04/16/2024 echo EF 70 to 75%, moderately elevated PASP, LA mildly dilated, mild MR 12/23/2023 echo bubble evidence of atrial level shunting 12/23/2023 carotid duplex mild bilateral carotid artery stenosis 04/22/2023 monitor predominantly sinus rhythm, average heart rate 71 bpm, rare PACs and PVCs 03/03/2023 echo EF 60 to 65%, grade 1 DD, mildly elevated PASP, LA moderately dilated  [facility] with [facility] in 2024, following a hospitalization for bradycardia and encephalopathy, she had been admitted with altered mental status, ammonia levels elevated, she was given lactulose  and had brief runs of SVT, also experienced bradycardia with pauses necessitating atropine  administration, monitor was arranged revealing an average heart rate of 71 bpm rare PACs and PVCs.  Most recently evaluated by Dr. Leavens on 06/02/2024, she had apparently had a recent hospitalization with a new diagnosis of paroxysmal atrial fibrillation, not a candidate for anticoagulation secondary to high risk of GI bleeding with varices and history of gastric ulcer.  Some discussion about Watchman procedure in the future, she was maintained on aspirin  for stroke prevention.  She presents today accompanied by her husband for follow-up, he provides most of her history.  She has had approximately 8 hospitalizations this year, but none since July  and they are very grateful for this.  We discussed atrial fibrillation, it does appear to present when she is having episodes of encephalopathy and during hospitalizations however we arranged a monitor revealing overall burden was 1% but she is not a candidate for DOAC and some consideration has been had regarding Wathcman procedure. Husband states all is stable right now, do not feel that they want to proceed with any further interventions for her atrial fibrillation at this time. She has pedal edema, wears compression socks, husband is well versed  in how to manage her lasix -- weighs her daily and checks her BP prior to administering her medication, he is taking excellent care of her.  She denies chest pain, palpitations, dyspnea, pnd, orthopnea, n, v, dizziness, syncope,  weight gain, or early satiety.    ROS: Review of Systems  Cardiovascular:  Positive for leg swelling.  All other systems reviewed and are negative.    Studies Reviewed EKG Interpretation Date/Time:  Monday October 02 2024 10:22:03 EST Ventricular Rate:  64 PR Interval:  202 QRS Duration:  106 QT Interval:  420 QTC Calculation: 433 R Axis:   9  Text Interpretation: Normal sinus rhythm Normal ECG When compared with ECG of 20-May-2024 16:02, PREVIOUS ECG IS PRESENT Confirmed by Carlin Nest ([id number]) on 10/02/2024 10:28:12 AM    Cardiac Studies & Procedures   ______________________________________________________________________________________________     ECHOCARDIOGRAM  ECHOCARDIOGRAM COMPLETE 04/16/2024  Narrative ECHOCARDIOGRAM REPORT    Patient Name:   [name] Date of Exam: 04/16/2024 Medical Rec #:  [id number]       Height:       64.0 in Accession #:    [id number]      Weight:  145.0 lb Date of Birth:  18-Oct-1951        BSA:          1.706 m Patient Age:    72 years        BP:           91/49 mmHg Patient Gender: F               HR:           74 bpm. Exam Location:  Inpatient  Procedure: 2D  Echo, Color Doppler and Cardiac Doppler (Both Spectral and Color Flow Doppler were utilized during procedure).  Indications:    Stroke i63.9  History:        Patient has prior history of Echocardiogram examinations, most recent 12/23/2023. Portal Hypertension; Risk Factors:Hypertension. S/p TIPS procedure.  Sonographer:    Damien Senior RDCS Referring Phys: EARLE BRAVO DE LA TORRE  IMPRESSIONS   1. Left ventricular ejection fraction, by estimation, is 70 to 75%. The left ventricle has hyperdynamic function. The left ventricle has no regional wall motion abnormalities. Left ventricular diastolic parameters are indeterminate. 2. Right ventricular systolic function is normal. The right ventricular size is mildly enlarged. There is moderately elevated pulmonary artery systolic pressure. The estimated right ventricular systolic pressure is 45.7 mmHg. 3. Left atrial size was mildly dilated. 4. The mitral valve is degenerative. Mild mitral valve regurgitation. The mean mitral valve gradient is 8.5 mmHg with average heart rate of 77 bpm, increased flow velocities secondary to high flow state, challenging to interpret degree of stenosis. Severe mitral annular calcification. 5. The aortic valve is tricuspid. Aortic valve regurgitation is not visualized. No aortic stenosis is present. 6. The inferior vena cava is dilated in size with <50% respiratory variability, suggesting right atrial pressure of 15 mmHg. 7. Increased flow velocities may be secondary to anemia, thyrotoxicosis, hyperdynamic or high flow state - suspect high flow from TIPS shunt physiology.  FINDINGS Left Ventricle: Left ventricular ejection fraction, by estimation, is 70 to 75%. The left ventricle has hyperdynamic function. The left ventricle has no regional wall motion abnormalities. The left ventricular internal cavity size was normal in size. There is no left ventricular hypertrophy. Left ventricular diastolic parameters are  indeterminate.  Right Ventricle: The right ventricular size is mildly enlarged. No increase in right ventricular wall thickness. Right ventricular systolic function is normal. There is moderately elevated pulmonary artery systolic pressure. The tricuspid regurgitant velocity is 2.77 m/s, and with an assumed right atrial pressure of 15 mmHg, the estimated right ventricular systolic pressure is 45.7 mmHg.  Left Atrium: Left atrial size was mildly dilated.  Right Atrium: Right atrial size was normal in size.  Pericardium: There is no evidence of pericardial effusion.  Mitral Valve: The mitral valve is degenerative in appearance. Severe mitral annular calcification. Mild mitral valve regurgitation. MV peak gradient, 16.2 mmHg. The mean mitral valve gradient is 8.5 mmHg with average heart rate of 77 bpm.  Tricuspid Valve: The tricuspid valve is normal in structure. Tricuspid valve regurgitation is mild.  Aortic Valve: The aortic valve is tricuspid. Aortic valve regurgitation is not visualized. No aortic stenosis is present. Aortic valve mean gradient measures 9.0 mmHg. Aortic valve peak gradient measures 16.1 mmHg. Aortic valve area, by VTI measures 2.00 cm.  Pulmonic Valve: The pulmonic valve was normal in structure. Pulmonic valve regurgitation is mild.  Aorta: The aortic root and ascending aorta are structurally normal, with no evidence of dilitation.  Venous: The inferior vena cava  is dilated in size with less than 50% respiratory variability, suggesting right atrial pressure of 15 mmHg.  IAS/Shunts: The atrial septum is grossly normal.   LEFT VENTRICLE PLAX 2D LVIDd:         5.10 cm   Diastology LVIDs:         2.80 cm   LV e' medial:    7.29 cm/s LV PW:         0.80 cm   LV E/e' medial:  17.8 LV IVS:        0.70 cm   LV e' lateral:   8.49 cm/s LVOT diam:     1.80 cm   LV E/e' lateral: 15.3 LV SV:         87 LV SV Index:   51 LVOT Area:     2.54 cm   RIGHT VENTRICLE RV S  prime:     12.30 cm/s TAPSE (M-mode): 2.3 cm  LEFT ATRIUM             Index        RIGHT ATRIUM           Index LA diam:        3.10 cm 1.82 cm/m   RA Area:     14.80 cm LA Vol (A2C):   53.0 ml 31.06 ml/m  RA Volume:   30.40 ml  17.81 ml/m LA Vol (A4C):   55.0 ml 32.23 ml/m LA Biplane Vol: 53.8 ml 31.53 ml/m AORTIC VALVE AV Area (Vmax):    1.87 cm AV Area (Vmean):   2.01 cm AV Area (VTI):     2.00 cm AV Vmax:           200.33 cm/s AV Vmean:          143.000 cm/s AV VTI:            0.436 m AV Peak Grad:      16.1 mmHg AV Mean Grad:      9.0 mmHg LVOT Vmax:         147.50 cm/s LVOT Vmean:        113.000 cm/s LVOT VTI:          0.342 m LVOT/AV VTI ratio: 0.79  AORTA Ao Root diam: 2.70 cm Ao Asc diam:  2.90 cm  MITRAL VALVE                TRICUSPID VALVE MV Area (PHT): 2.11 cm     TR Peak grad:   30.7 mmHg MV Area VTI:   1.78 cm     TR Vmax:        277.00 cm/s MV Peak grad:  16.2 mmHg MV Mean grad:  8.5 mmHg     SHUNTS MV Vmax:       2.01 m/s     Systemic VTI:  0.34 m MV Vmean:      138.5 cm/s   Systemic Diam: 1.80 cm MV Decel Time: 360 msec MV E velocity: 130.00 cm/s MV A velocity: 166.00 cm/s MV E/A ratio:  0.78  Soyla Merck MD Electronically signed by Soyla Merck MD Signature Date/Time: 04/16/2024/9:50:23 AM    Final    MONITORS  CARDIAC EVENT MONITOR 06/02/2024  Narrative   Patient had a minimum heart rate of 44 bpm, maximum heart rate of 114 bpm, and average heart rate of 69 bpm. Predominant underlying rhythm was sinus rhythm. Paroxsymal atrial fibrillation, 1% burden, longest event was over 6 hours Isolated PACs were rare (<  1.0%). Isolated PVCs were rare (<1.0%). No triggered and diary events.  Asymptomatic paroxsymal atrial fibrillation.       ______________________________________________________________________________________________      Risk Assessment/Calculations  CHA2DS2-VASc Score = 5   This indicates a 7.2% annual risk  of stroke. The patient's score is based upon: CHF History: 0 HTN History: 1 Diabetes History: 0 Stroke History: 2 Vascular Disease History: 0 Age Score: 1 Gender Score: 1            Physical Exam VS:  BP 110/62   Pulse 64   Ht 5' 4 (1.626 m)   Wt 153 lb 9.6 oz (69.7 kg)   SpO2 94%   BMI 26.37 kg/m        Wt Readings from Last 3 Encounters:  10/02/24 153 lb 9.6 oz (69.7 kg)  09/02/24 155 lb 10.3 oz (70.6 kg)  08/14/24 155 lb 9.6 oz (70.6 kg)    GEN: Well nourished, well developed in no acute distress NECK: No JVD; No carotid bruits CARDIAC: RRR, soft systolic murmur 2/6 heard at LSB, rubs, gallops RESPIRATORY:  Clear to auscultation without rales, wheezing or rhonchi  ABDOMEN: Soft, non-tender, non-distended EXTREMITIES:  edematous but non-pitting; No deformity   ASSESSMENT AND PLAN PAF/hypercoagulable state -she has maintaining sinus rhythm today, her CHA2DS2-VASc score is 5 however her bleeding risk is too great, some consideration given Watchman procedure however family is not interested in this at this time--feels she is relatively stable for the first time in a few years and do not want to proceed with consideration at this time.  Most recent monitor revealed burden of 1%.  Husband states that typically this occurs when she is encephalopathic during hospitalizations.  Pedal edema-she has Lasix  to take as needed for weight gain or pedal edema, her husband is well versed  in this, sometimes gives her 1/day or 2/day depending on volume status as well as her blood pressure.  History of stroke-currently on aspirin .   Cirrhosis-follows with Dr. Abran.  PFO-not felt to be significant contributor to her risk factor for stroke, low likelihood that closure would be beneficial per Dr. Santo.       Dispo: Follow up in 6 months.   Signed, Delon JAYSON Hoover, NP

## 2024-09-25 ENCOUNTER — Ambulatory Visit: Attending: Nurse Practitioner | Admitting: Physical Therapy

## 2024-09-25 ENCOUNTER — Encounter: Payer: Self-pay | Admitting: Physical Therapy

## 2024-09-25 DIAGNOSIS — R262 Difficulty in walking, not elsewhere classified: Secondary | ICD-10-CM | POA: Insufficient documentation

## 2024-09-25 DIAGNOSIS — R2681 Unsteadiness on feet: Secondary | ICD-10-CM | POA: Diagnosis present

## 2024-09-25 DIAGNOSIS — M6281 Muscle weakness (generalized): Secondary | ICD-10-CM | POA: Diagnosis present

## 2024-09-25 DIAGNOSIS — R278 Other lack of coordination: Secondary | ICD-10-CM | POA: Diagnosis present

## 2024-09-25 DIAGNOSIS — R2689 Other abnormalities of gait and mobility: Secondary | ICD-10-CM | POA: Diagnosis present

## 2024-09-25 NOTE — Therapy (Signed)
 OUTPATIENT PHYSICAL THERAPY NEURO TREATMENT NOTE   Patient Name: [name] MRN: [id number] DOB:1951-07-17, 73 y.o., female Today's Date: 09/25/2024   PCP: [name], NP REFERRING PROVIDER: [name], NP  END OF SESSION:  PT End of Session - 09/25/24 1404     Visit Number 9    Number of Visits 17    Date for Recertification  10/06/24    Authorization Type [employer] - Auth. required    Authorization Time Period 08-08-24 - 10-03-24    Authorization - Visit Number 9    Authorization - Number of Visits 17    Progress Note Due on Visit 10    PT Start Time 1400    PT Stop Time 1447    PT Time Calculation (min) 47 min    Equipment Utilized During Treatment Gait belt    Activity Tolerance Patient tolerated treatment well   c/o Rt hip pain which appears to be consistent with bursitis   Behavior During Therapy [facility] for tasks assessed/performed              Past Medical History:  Diagnosis Date   Acute hepatic encephalopathy (HCC) 03/30/2024   Acute urinary retention 05/04/2022   Allergy 1952   Contrast dye   Arthritis    Knees and thumb   Blood transfusion without reported diagnosis    Cancer [facility])    cecum   Cataract    Surgery scheduled July 2023   Colon cancer (HCC) 2003   Elevated liver function tests    Esophageal varices (HCC)    Heart murmur    Hemorrhage of gastrointestinal tract 05/04/2011   Hepatic encephalopathy (HCC) 2025   Hypertension    Hypothyroidism    Iron deficiency anemia    Liver disease    chemotherapy complication, per pt, shunts placed to bypass liver   Malignant neoplasm of cecum (HCC)    Myocardial infarction [facility]) Jan 2025   Portal hypertension (HCC)    Skin cancer 2019   Splenomegaly    Stroke [facility])    Ulcer 05/2024   Past Surgical History:  Procedure Laterality Date   COLON SURGERY     Cancer   COSMETIC SURGERY     Skin cancer   ESOPHAGEAL VARICE LIGATION     ESOPHAGOGASTRODUODENOSCOPY N/A 05/28/2024    Procedure: EGD (ESOPHAGOGASTRODUODENOSCOPY);  Surgeon: Albertus Gordy HERO, MD;  Location: [facility];  Service: Gastroenterology;  Laterality: N/A;   EYE SURGERY     For cataracts   HEMICOLECTOMY  01/08/2003   IR RADIOLOGIST EVAL & MGMT  12/20/2020   IR RADIOLOGIST EVAL & MGMT  05/29/2021   LIVER SURGERY     shunts placed after chemo complication   SKIN FULL THICKNESS GRAFT N/A 09/12/2019   Procedure: debridement and FTSG to the nose from left upper arm;  Surgeon: Elisabeth Craig RAMAN, MD;  Location: [facility];  Service: Plastics;  Laterality: N/A;  2 hours, please   TIPS PROCEDURE     Patient Active Problem List   Diagnosis Date Noted   AMS (altered mental status) 07/01/2024   Heme positive stool 05/28/2024   Acute gastric ulcer without hemorrhage or perforation 05/28/2024   Gastritis and gastroduodenitis 05/28/2024   Acute on chronic anemia 05/27/2024   SIRS (systemic inflammatory response syndrome) (HCC) 05/21/2024   Acute prerenal azotemia 05/21/2024   Paroxysmal atrial fibrillation (HCC) 05/21/2024   Acute metabolic encephalopathy 05/20/2024   Vision disturbance 05/11/2024   Patent foramen ovale  04/16/2024   Acute CVA (cerebrovascular accident) (HCC) 04/15/2024   Abnormal CBC 04/10/2024   Hyperkalemia 04/05/2024   Hypoglycemia 04/05/2024   Macrocytic anemia 04/05/2024   Cerebrovascular accident (CVA) (HCC) 03/16/2024   History of stroke 01/06/2024   Thrombocytopenia 12/21/2023   Decreased GFR 12/10/2023   Preventative health care 09/09/2023   Lower extremity edema 08/10/2023   Cellulitis of both lower extremities 08/04/2023   Altered mental status 08/04/2023   Bradycardia 05/07/2023   Protein-calorie malnutrition, severe 02/26/2023   Weight loss 02/24/2023   Other constipation 02/24/2023   Generalized abdominal pain 02/24/2023   Hospital discharge follow-up 02/24/2023   Weakness 01/04/2023   Confusion 08/26/2022   Frequent UTI 05/18/2022   Anemia of chronic  disease 05/18/2022   AKI (acute kidney injury) 04/30/2022   Tachycardia-bradycardia syndrome (HCC) 02/05/2022   Hypomagnesemia 02/04/2022   Hepatic encephalopathy (HCC) 02/03/2022   Basal cell carcinoma (BCC) 05/06/2021   Hypertension 05/06/2021   Cirrhosis (HCC) 12/04/2020   Murmur, cardiac 03/19/2020   Acquired hypothyroidism 06/30/2019   History of basal cell cancer 06/30/2019   Hx of colon cancer, stage III 11/30/2011   Portal hypertension (HCC) 01/23/2008    ONSET DATE: 07-01-24 for most recent hospitalization  REFERRING DIAG:  Diagnosis  K76.82 (ICD-10-CM) - Hepatic encephalopathy (HCC)  R53.1 (ICD-10-CM) - Weakness    THERAPY DIAG:  Unsteadiness on feet  Other abnormalities of gait and mobility  Muscle weakness (generalized)  Difficulty in walking, not elsewhere classified  Other lack of coordination  Rationale for Evaluation and Treatment: Rehabilitation  SUBJECTIVE:                                                                                                                                                                                             SUBJECTIVE STATEMENT: Patient reports she has had a very busy week; has not really had time to do the seated aerobics, but feels the going side-to-side and climbing into the truck is the same thing.  Husband endorses that her energy levels are good and mentally she is good.  Pt accompanied by: spouse, [name]  PERTINENT HISTORY:  Per chart note 07-01-24; past medical history significant for cirrhosis, prior hepatic encephalopathy, acquired hypothyroidism, presented to hospital with altered mental status and elevated ammonia level for 5 days with generalized weakness.  hospitalized 07-01-24 - 07-03-24 with hepatic encephalopathy PMHx of  Stroke Hypothyroidism Cecal adenocarcinoma, s/p resection and chemotherapy in 2004 Liver disease, cirrhosis,  Skin Cancer  Hypertension  PAIN:   Are you having pain? Yes: NPRS  scale: no - 0/10 Pain location: Rt lateral hip  Pain description: throbbing,  sore Aggravating factors: prolonged standing/walking; have to be careful how I lay - able to lie on Rt side if heat is on it Relieving factors: [place] & heat   PRECAUTIONS: Fall  RED FLAGS: None   WEIGHT BEARING RESTRICTIONS: No  FALLS: Has patient fallen in last 6 months? No  LIVING ENVIRONMENT: Lives with: lives with their spouse Lives in: House/apartment Stairs: No Has following equipment at home: Ramped entry; SPC, RW, shower chair, manual wheelchair, BSC  PLOF: Independent with basic ADLs, Independent with household mobility with device, Independent with transfers, and Needs assistance with homemaking  PATIENT GOALS: walk without my cane; increase energy   OBJECTIVE:  Note: Objective measures were completed at Evaluation unless otherwise noted.  DIAGNOSTIC FINDINGS: CT scan 06-19-24 IMPRESSION: 1. No evidence of acute intracranial abnormality. 2. Similar prior left PCA territory infarct and chronic microvascular ischemic change.  COGNITION: Overall cognitive status: Within functional limits for tasks assessed   SENSATION: Not tested  COORDINATION: WFL's bil. LE's    POSTURE: rounded shoulders, forward head, and increased thoracic kyphosis  LOWER EXTREMITY ROM:   WFL's bil. LE's   LOWER EXTREMITY MMT:    MMT Right Eval Left Eval  Hip flexion 4 4  Hip extension    Hip abduction    Hip adduction    Hip internal rotation    Hip external rotation    Knee flexion 4 4  Knee extension 5 5  Ankle dorsiflexion 5 5  Ankle plantarflexion    Ankle inversion    Ankle eversion    (Blank rows = not tested)  BED MOBILITY:  Not tested =pt reports independent with bed mobility  TRANSFERS: Sit to stand: Modified independence  Assistive device utilized: bil. Armrests of chair     Stand to sit: Modified independence  Assistive device utilized: bil. Armrests of chair      RAMP:   Not tested  CURB:  Not tested  STAIRS: Not tested GAIT: Findings: Gait Characteristics: step through pattern, decreased step length- Right, and decreased step length- Left, Distance walked: 75', Assistive device utilized:Single point cane, Level of assistance: CGA, and Comments: SPC with small quad tip used during eval; pt reports she is beginning to ambulate very short distances in home without use of SPC; slow gait speed   FUNCTIONAL TESTS:  5 times sit to stand: 35.88 secs without UE support - from chair Timed up and go (TUG): 19.38 secs with SPC 10 meter walk test:  20.65 secs = 1.59 ft/sec                                                                                                                                TREATMENT: 09-25-24  Speed supersets: -3x20 seated 4 toe taps > 3x20 standing 4 toe taps SBA-CGA -Seated modified sit up 3x10 > Alt 1lb punches 3x10 -Isometric 2lb hold at 90 deg shoulder flexion during Bil heel lifts 3x10 > Standing  alt step outs 3x10 -Standing march 2x20 > side step out 2x10 alt LE  PATIENT EDUCATION: Education details:  Emphasized need to do regular activity in order to maintain energy levels and progress.  Use of small (1-2lb hand weights) at home for upper body work. Person educated: Patient and Spouse Education method: Explanation, Demonstration, and Handouts Education comprehension: verbalized understanding and needs further education  HOME EXERCISE PROGRAM: See above - [employer] [id number]  5-Minute Seated Cardio Blast - Silver Orthoptist Program (Try to do each combo continuously and take rest in between each set if needed.  You may alternate sides with each exercise to help manage fatigue with movement.)  Upper Body Combo: Sit tall in your chair and perform each move 4x. Shoulders lift up toward ears and then release down. Shoulders roll front and then back - you can use your elbows to progress movement as needed. Cross arms,  then open arms. Arms reach front, then pull.  Lower Body Combo: Start this circuit with a stretch to loosen up your shoulders.  Roll your left shoulder back, then roll your right shoulder back and repeat on each side 3 times.   Perform these alternating heel taps 8x each. Tap your heel to the front, then back.  Swing the opposite arm forward with each tap. Tap your heel to the front, then back.  Extend opposite arm up toward the sky with each tap.  Side Tap Combo: Sit tall in your chair and alternate side-to-side toe taps 8x each. Alternate heel taps from side to side. Alternate heel taps from side to side and cross arms in front of you, then pull arms back.  Reach and Pull: Perform this move 16x (can do both or single arm at a time) Bring your elbows up, reach toward the front of the room, pull arms back and bring elbows down.  Single Leg Pump - Right Side: Perform this move 8x each. Pick up the speed and tap your right heel forward then back.  Pump your arms as you move.  Double Side Tap: Tap your right toe out to the right side twice.  Bring your right arm across your body to push toward the opposite side of the room with each tap.  Then, alternate the double toe tap on each side 8x.  Double Arm Pull Down: Reach both arms up toward the sky, then pull arms down to sides.  As you pull down, squeeze your shoulder blades together and gently lift your heels from the floor.  Repeat this move 16x.  Single Leg Pump - Left Side: Perform these moves 8x. Pick up the speed and tap your left heel forward, then back.  Pump your arms as you move.  Fast Feet: Alternate tapping your toes to the ground in small, quick movements.  Repeat 16x.  Torso Twist: Repeat this move 16x. Twist your upper body to the right, then to the left.  GOALS: Goals reviewed with patient? Yes  SHORT TERM GOALS: Target date: 09-08-24  Improve 5x sit to stand transfer score to </= 28 secs without UE support from  chair to demo improved LE strength.  Baseline:  5 times sit to stand: 35.88 secs without UE support - from chair; 09-14-24: 23.31 secs without UE support Goal status: MET 09-14-24  2.  Improve TUG score to </= 16 secs with SPC to demonstrate reduced fall risk. Baseline: 19.38 secs with SPC;   09-14-24 - 21.97 secs   Goal status: Not met 09-14-24  3.  Increase gait velocity to >/= 1.9 ft/sec with use of SPC for increased gait efficiency. Baseline:  10 meter walk test:  20.65 secs = 1.59 ft/sec with SPC ;  16.57, 14.53 = 2.26 ft/sec with SPC Goal status: Goal met 09-14-24  4.  Ambulate 76' without use of SPC with supervision for household ambulation.  Baseline:  Goal status: Partially met 09-14-24  5.   Amb. 2 nonstop with use of SPC with SBA to CGA to demonstrate improved endurance/activity tolerance. Baseline: 2:03 - RPE 4-5/10 Goal status: MET 09-14-24  6.  Independent in HEP for balance and LE strengthening.  Baseline:  Goal status: Ongoing 09-14-24    LONG TERM GOALS: Target date: 10-06-24  Improve 5x sit to stand transfer score to </= 22 secs without UE support from chair to demo improved LE strength.  Baseline:  5 times sit to stand: 35.88 secs without UE support - from chair Goal status: INITIAL  2.  Improve TUG score to </= 13.5 secs with SPC to demonstrate reduced fall risk. Baseline: 19.38 secs with SPC Goal status: INITIAL  3.  Increase Berg balance test score by at least 5 points to demo improved balance and decreased fall risk.  Baseline: TBA Goal status: INITIAL  4.  Pt will report modified independent household ambulation without use of SPC. Baseline:  Goal status: INITIAL  5.  Increase gait velocity to >/= 2.4 ft/sec with use of SPC for increased gait efficiency. Baseline:  10 meter walk test:  20.65 secs = 1.59 ft/sec with SPC  Goal status: INITIAL  6.  Amb. 4 nonstop with use of SPC with SBA for increased community accessibility.  Baseline:  Goal  status: INITIAL  ASSESSMENT:  CLINICAL IMPRESSION: Focus of skilled PT session today on speed and power engaging BLE and core in seated and standing.  She does appear to preference her RLE in stance with longer L stride.  Attempted to add element of symmetry to stepping tasks.  She demonstrates improved stepping strategy w/ standing LOB and improved activity tolerance today.  She continues to benefit from skilled PT in this setting to optimize safest upright mobility and efficiency.  Cont with POC.   OBJECTIVE IMPAIRMENTS: decreased activity tolerance, decreased balance, decreased endurance, and difficulty walking.   ACTIVITY LIMITATIONS: carrying, lifting, bending, squatting, stairs, and locomotion level  PARTICIPATION LIMITATIONS: meal prep, cleaning, laundry, driving, shopping, and community activity  PERSONAL FACTORS: Behavior pattern, Past/current experiences, Time since onset of injury/illness/exacerbation, and 1-2 comorbidities: h/o hepatic encephalopathy with multiple hospitalizations and h/o CVA are also affecting patient's functional outcome.   REHAB POTENTIAL: Good  CLINICAL DECISION MAKING: Evolving/moderate complexity  EVALUATION COMPLEXITY: Moderate  PLAN:  PT FREQUENCY: 2x/week  PT DURATION: 8 weeks  PLANNED INTERVENTIONS: 97110-Therapeutic exercises, 97530- Therapeutic activity, 97112- Neuromuscular re-education, [phone]- Self Care, [id number]- Gait training, and Patient/Family education  PLAN FOR NEXT SESSION:  continue balance and gait training, LE strengthening; endurance training; how is seated aerobics program-is she working w/ small upper body weights at home?   Daved [name], PT, DPT 09/25/2024, 2:50 PM

## 2024-09-28 ENCOUNTER — Ambulatory Visit: Admitting: Physical Therapy

## 2024-09-28 DIAGNOSIS — R2681 Unsteadiness on feet: Secondary | ICD-10-CM | POA: Diagnosis not present

## 2024-09-28 DIAGNOSIS — R278 Other lack of coordination: Secondary | ICD-10-CM

## 2024-09-28 DIAGNOSIS — R2689 Other abnormalities of gait and mobility: Secondary | ICD-10-CM

## 2024-09-28 NOTE — Therapy (Unsigned)
 OUTPATIENT PHYSICAL THERAPY NEURO TREATMENT NOTE/10TH VISIT PROGRESS NOTE     Progress Note  Reporting Period 08-08-24 to 09-28-24 See note below for Objective Data and Assessment of Progress/Goals.     Patient Name: [name] MRN: [id number] DOB:December 08, 1950, 73 y.o., female Today's Date: 09/29/2024   PCP: [name], NP REFERRING PROVIDER: [name], NP  END OF SESSION:  PT End of Session - 09/29/24 0704     Visit Number 10    Number of Visits 17    Date for Recertification  10/06/24    Authorization Type [employer] - Auth. required    Authorization Time Period 08-08-24 - 10-03-24    Authorization - Visit Number 10    Authorization - Number of Visits 17    Progress Note Due on Visit 10    PT Start Time 1402    PT Stop Time 1446    PT Time Calculation (min) 44 min    Equipment Utilized During Treatment Gait belt    Activity Tolerance Patient tolerated treatment well   c/o Rt hip pain which appears to be consistent with bursitis   Behavior During Therapy [facility] for tasks assessed/performed               Past Medical History:  Diagnosis Date   Acute hepatic encephalopathy (HCC) 03/30/2024   Acute urinary retention 05/04/2022   Allergy 1952   Contrast dye   Arthritis    Knees and thumb   Blood transfusion without reported diagnosis    Cancer [facility])    cecum   Cataract    Surgery scheduled July 2023   Colon cancer (HCC) 2003   Elevated liver function tests    Esophageal varices (HCC)    Heart murmur    Hemorrhage of gastrointestinal tract 05/04/2011   Hepatic encephalopathy (HCC) 2025   Hypertension    Hypothyroidism    Iron deficiency anemia    Liver disease    chemotherapy complication, per pt, shunts placed to bypass liver   Malignant neoplasm of cecum (HCC)    Myocardial infarction [facility]) Jan 2025   Portal hypertension (HCC)    Skin cancer 2019   Splenomegaly    Stroke [facility])    Ulcer 05/2024   Past Surgical History:  Procedure  Laterality Date   COLON SURGERY     Cancer   COSMETIC SURGERY     Skin cancer   ESOPHAGEAL VARICE LIGATION     ESOPHAGOGASTRODUODENOSCOPY N/A 05/28/2024   Procedure: EGD (ESOPHAGOGASTRODUODENOSCOPY);  Surgeon: Albertus Gordy HERO, MD;  Location: [facility];  Service: Gastroenterology;  Laterality: N/A;   EYE SURGERY     For cataracts   HEMICOLECTOMY  01/08/2003   IR RADIOLOGIST EVAL & MGMT  12/20/2020   IR RADIOLOGIST EVAL & MGMT  05/29/2021   LIVER SURGERY     shunts placed after chemo complication   SKIN FULL THICKNESS GRAFT N/A 09/12/2019   Procedure: debridement and FTSG to the nose from left upper arm;  Surgeon: Elisabeth Craig RAMAN, MD;  Location: [facility];  Service: Plastics;  Laterality: N/A;  2 hours, please   TIPS PROCEDURE     Patient Active Problem List   Diagnosis Date Noted   AMS (altered mental status) 07/01/2024   Heme positive stool 05/28/2024   Acute gastric ulcer without hemorrhage or perforation 05/28/2024   Gastritis and gastroduodenitis 05/28/2024   Acute on chronic anemia 05/27/2024   SIRS (systemic inflammatory response syndrome) (HCC) 05/21/2024  Acute prerenal azotemia 05/21/2024   Paroxysmal atrial fibrillation (HCC) 05/21/2024   Acute metabolic encephalopathy 05/20/2024   Vision disturbance 05/11/2024   Patent foramen ovale 04/16/2024   Acute CVA (cerebrovascular accident) (HCC) 04/15/2024   Abnormal CBC 04/10/2024   Hyperkalemia 04/05/2024   Hypoglycemia 04/05/2024   Macrocytic anemia 04/05/2024   Cerebrovascular accident (CVA) (HCC) 03/16/2024   History of stroke 01/06/2024   Thrombocytopenia 12/21/2023   Decreased GFR 12/10/2023   Preventative health care 09/09/2023   Lower extremity edema 08/10/2023   Cellulitis of both lower extremities 08/04/2023   Altered mental status 08/04/2023   Bradycardia 05/07/2023   Protein-calorie malnutrition, severe 02/26/2023   Weight loss 02/24/2023   Other constipation 02/24/2023   Generalized  abdominal pain 02/24/2023   Hospital discharge follow-up 02/24/2023   Weakness 01/04/2023   Confusion 08/26/2022   Frequent UTI 05/18/2022   Anemia of chronic disease 05/18/2022   AKI (acute kidney injury) 04/30/2022   Tachycardia-bradycardia syndrome (HCC) 02/05/2022   Hypomagnesemia 02/04/2022   Hepatic encephalopathy (HCC) 02/03/2022   Basal cell carcinoma (BCC) 05/06/2021   Hypertension 05/06/2021   Cirrhosis (HCC) 12/04/2020   Murmur, cardiac 03/19/2020   Acquired hypothyroidism 06/30/2019   History of basal cell cancer 06/30/2019   Hx of colon cancer, stage III 11/30/2011   Portal hypertension (HCC) 01/23/2008    ONSET DATE: 07-01-24 for most recent hospitalization  REFERRING DIAG:  Diagnosis  K76.82 (ICD-10-CM) - Hepatic encephalopathy (HCC)  R53.1 (ICD-10-CM) - Weakness    THERAPY DIAG:  Unsteadiness on feet  Other abnormalities of gait and mobility  Other lack of coordination  Rationale for Evaluation and Treatment: Rehabilitation  SUBJECTIVE:                                                                                                                                                                                             SUBJECTIVE STATEMENT: Husband reports that pt is doing so much better - pt agrees with this statement.  He reports pt walked 2 laps (.7 mile) yesterday on the track at [employer] He requests that we work on coordination activities in today's session - reciprocal movements - as he reports pt has much difficulty with the coordination of moving opposite arm and leg. He states pt is trying to do sidestepping with opposite arm movement at home but has difficulty with the coordination.    Pt accompanied by: spouse, [name]  PERTINENT HISTORY:  Per chart note 07-01-24; past medical history significant for cirrhosis, prior hepatic encephalopathy, acquired hypothyroidism, presented to hospital with altered mental status and elevated  ammonia level for 5 days  with generalized weakness.  hospitalized 07-01-24 - 07-03-24 with hepatic encephalopathy PMHx of  Stroke Hypothyroidism Cecal adenocarcinoma, s/p resection and chemotherapy in 2004 Liver disease, cirrhosis,  Skin Cancer  Hypertension  PAIN:   Are you having pain? Yes: NPRS scale: no - 0/10 Pain location: Rt lateral hip  Pain description: throbbing, sore Aggravating factors: prolonged standing/walking; have to be careful how I lay - able to lie on Rt side if heat is on it Relieving factors: [place] & heat   PRECAUTIONS: Fall  RED FLAGS: None   WEIGHT BEARING RESTRICTIONS: No  FALLS: Has patient fallen in last 6 months? No  LIVING ENVIRONMENT: Lives with: lives with their spouse Lives in: House/apartment Stairs: No Has following equipment at home: Ramped entry; SPC, RW, shower chair, manual wheelchair, BSC  PLOF: Independent with basic ADLs, Independent with household mobility with device, Independent with transfers, and Needs assistance with homemaking  PATIENT GOALS: walk without my cane; increase energy   OBJECTIVE:  Note: Objective measures were completed at Evaluation unless otherwise noted.  DIAGNOSTIC FINDINGS: CT scan 06-19-24 IMPRESSION: 1. No evidence of acute intracranial abnormality. 2. Similar prior left PCA territory infarct and chronic microvascular ischemic change.  COGNITION: Overall cognitive status: Within functional limits for tasks assessed   SENSATION: Not tested  COORDINATION: WFL's bil. LE's    POSTURE: rounded shoulders, forward head, and increased thoracic kyphosis  LOWER EXTREMITY ROM:   WFL's bil. LE's   LOWER EXTREMITY MMT:    MMT Right Eval Left Eval  Hip flexion 4 4  Hip extension    Hip abduction    Hip adduction    Hip internal rotation    Hip external rotation    Knee flexion 4 4  Knee extension 5 5  Ankle dorsiflexion 5 5  Ankle plantarflexion    Ankle inversion    Ankle eversion     (Blank rows = not tested)  BED MOBILITY:  Not tested =pt reports independent with bed mobility  TRANSFERS: Sit to stand: Modified independence  Assistive device utilized: bil. Armrests of chair     Stand to sit: Modified independence  Assistive device utilized: bil. Armrests of chair      RAMP:  Not tested  CURB:  Not tested  STAIRS: Not tested GAIT: Findings: Gait Characteristics: step through pattern, decreased step length- Right, and decreased step length- Left, Distance walked: 75', Assistive device utilized:Single point cane, Level of assistance: CGA, and Comments: SPC with small quad tip used during eval; pt reports she is beginning to ambulate very short distances in home without use of SPC; slow gait speed   FUNCTIONAL TESTS:  5 times sit to stand: 35.88 secs without UE support - from chair Timed up and go (TUG): 19.38 secs with SPC 10 meter walk test:  20.65 secs = 1.59 ft/sec  TREATMENT: 09-28-24  Self Care; Discussed POC with duration and frequency discussed; end of auth. Is 10-03-24, but target date is 10-06-24 and today's visit is #10 of 17 requested; discussed decreasing frequency to 1x/week after next week (10-06-24) to spread visits out;  pt and husband agree with this recommendation  Gait: Pt gait trained 50' x 2 reps without use of SPC with CGA for safety  Step negotiation with use of bil. Hand rails - step by step sequence with CGA  TherAct: In seated position - pt performed knee extension with contralateral UE flexion 10 reps each side Seated marching with contralateral UE flexion 10 reps each side - tactile cues prn for reciprocal movement  Standing position - at balance bar at mirror for UE support prn Standing marching - with contralateral UE flexion - no alternation initially - 10 reps each Progressed to alternating - marching  with contralateral UE flexion 5 reps each side Crossovers front 1 lap along bar/mirror with tactile cue for correct sequence Stepping behind 1 lap  along bar with tactile cues for correct sequence Alternate tap ups to 8 step 5 reps each foot; progressed to diagonal tap ups to 8 step 5 reps each foot - with CGA to min assist for balance recovery      PATIENT EDUCATION: Education details:  Instructed pt (& husband) to add opposite UE movement to the LE movement for HEP at home - as performed in today's session; both seated and standng positions  Person educated: Patient and Spouse Education method: Explanation, Demonstration, and Handouts Education comprehension: verbalized understanding and needs further education  HOME EXERCISE PROGRAM: See above - [employer] [id number]  5-Minute Seated Cardio Blast - [employer] Program (Try to do each combo continuously and take rest in between each set if needed.  You may alternate sides with each exercise to help manage fatigue with movement.)  Upper Body Combo: Sit tall in your chair and perform each move 4x. Shoulders lift up toward ears and then release down. Shoulders roll front and then back - you can use your elbows to progress movement as needed. Cross arms, then open arms. Arms reach front, then pull.  Lower Body Combo: Start this circuit with a stretch to loosen up your shoulders.  Roll your left shoulder back, then roll your right shoulder back and repeat on each side 3 times.   Perform these alternating heel taps 8x each. Tap your heel to the front, then back.  Swing the opposite arm forward with each tap. Tap your heel to the front, then back.  Extend opposite arm up toward the sky with each tap.  Side Tap Combo: Sit tall in your chair and alternate side-to-side toe taps 8x each. Alternate heel taps from side to side. Alternate heel taps from side to side and cross arms in front of you, then pull arms back.  Reach and  Pull: Perform this move 16x (can do both or single arm at a time) Bring your elbows up, reach toward the front of the room, pull arms back and bring elbows down.  Single Leg Pump - Right Side: Perform this move 8x each. Pick up the speed and tap your right heel forward then back.  Pump your arms as you move.  Double Side Tap: Tap your right toe out to the right side twice.  Bring your right arm across your body to push toward the opposite side of the room with each tap.  Then, alternate the double toe  tap on each side 8x.  Double Arm Pull Down: Reach both arms up toward the sky, then pull arms down to sides.  As you pull down, squeeze your shoulder blades together and gently lift your heels from the floor.  Repeat this move 16x.  Single Leg Pump - Left Side: Perform these moves 8x. Pick up the speed and tap your left heel forward, then back.  Pump your arms as you move.  Fast Feet: Alternate tapping your toes to the ground in small, quick movements.  Repeat 16x.  Torso Twist: Repeat this move 16x. Twist your upper body to the right, then to the left.  GOALS: Goals reviewed with patient? Yes  SHORT TERM GOALS: Target date: 09-08-24  Improve 5x sit to stand transfer score to </= 28 secs without UE support from chair to demo improved LE strength.  Baseline:  5 times sit to stand: 35.88 secs without UE support - from chair; 09-14-24: 23.31 secs without UE support Goal status: MET 09-14-24  2.  Improve TUG score to </= 16 secs with SPC to demonstrate reduced fall risk. Baseline: 19.38 secs with SPC;   09-14-24 - 21.97 secs   Goal status: Not met 09-14-24  3.  Increase gait velocity to >/= 1.9 ft/sec with use of SPC for increased gait efficiency. Baseline:  10 meter walk test:  20.65 secs = 1.59 ft/sec with SPC ;  16.57, 14.53 = 2.26 ft/sec with SPC Goal status: Goal met 09-14-24  4.  Ambulate 45' without use of SPC with supervision for household ambulation.  Baseline:  Goal  status: Partially met 09-14-24  5.   Amb. 2 nonstop with use of SPC with SBA to CGA to demonstrate improved endurance/activity tolerance. Baseline: 2:03 - RPE 4-5/10 Goal status: MET 09-14-24  6.  Independent in HEP for balance and LE strengthening.  Baseline:  Goal status: Ongoing 09-14-24    LONG TERM GOALS: Target date: 10-06-24  Improve 5x sit to stand transfer score to </= 22 secs without UE support from chair to demo improved LE strength.  Baseline:  5 times sit to stand: 35.88 secs without UE support - from chair Goal status: INITIAL  2.  Improve TUG score to </= 13.5 secs with SPC to demonstrate reduced fall risk. Baseline: 19.38 secs with SPC Goal status: INITIAL  3.  Increase Berg balance test score by at least 5 points to demo improved balance and decreased fall risk.  Baseline: TBA Goal status: INITIAL  4.  Pt will report modified independent household ambulation without use of SPC. Baseline:  Goal status: INITIAL  5.  Increase gait velocity to >/= 2.4 ft/sec with use of SPC for increased gait efficiency. Baseline:  10 meter walk test:  20.65 secs = 1.59 ft/sec with SPC  Goal status: INITIAL  6.  Amb. 4 nonstop with use of SPC with SBA for increased community accessibility.  Baseline:  Goal status: INITIAL  ASSESSMENT:  CLINICAL IMPRESSION: This 10th visit progress note covers dates 08-08-24 - 09-28-24.  Pt has met STG's #1, 3, 5 and 6:  STG's #2 & 4 are in progress with progress achieved since initial eval but not fully to stated goal.  Pt ambulated .7 mile at [employer] on 09-27-24 per husband's report with use of SPC.  Today's session focused on standing balance exercises with contralateral UE movements to improve coordination.  Pt needs intermittent UE support for safety due to balance deficits.  Pt is progressing well towards LTG's.  Cont with POC.   OBJECTIVE IMPAIRMENTS: decreased activity tolerance, decreased balance, decreased endurance, and difficulty  walking.   ACTIVITY LIMITATIONS: carrying, lifting, bending, squatting, stairs, and locomotion level  PARTICIPATION LIMITATIONS: meal prep, cleaning, laundry, driving, shopping, and community activity  PERSONAL FACTORS: Behavior pattern, Past/current experiences, Time since onset of injury/illness/exacerbation, and 1-2 comorbidities: h/o hepatic encephalopathy with multiple hospitalizations and h/o CVA are also affecting patient's functional outcome.   REHAB POTENTIAL: Good  CLINICAL DECISION MAKING: Evolving/moderate complexity  EVALUATION COMPLEXITY: Moderate  PLAN:  PT FREQUENCY: 2x/week  PT DURATION: 8 weeks  PLANNED INTERVENTIONS: 97110-Therapeutic exercises, 97530- Therapeutic activity, 97112- Neuromuscular re-education, [phone]- Self Care, [id number]- Gait training, and Patient/Family education  PLAN FOR NEXT SESSION: PLEASE BEGIN CHECKING LTG'S (I will do renewal - thank you!)   how are coordination exercises going at home? continue balance and gait training, core and  LE strengthening   Mancel Lardizabal, [place], PT 09/29/2024, 7:16 AM

## 2024-09-29 ENCOUNTER — Encounter: Payer: Self-pay | Admitting: Physical Therapy

## 2024-10-02 ENCOUNTER — Ambulatory Visit: Admitting: Nurse Practitioner

## 2024-10-02 ENCOUNTER — Ambulatory Visit

## 2024-10-02 ENCOUNTER — Encounter: Payer: Self-pay | Admitting: Cardiology

## 2024-10-02 ENCOUNTER — Ambulatory Visit: Admitting: Cardiology

## 2024-10-02 VITALS — BP 110/47 | HR 66

## 2024-10-02 VITALS — BP 110/62 | HR 64 | Ht 64.0 in | Wt 153.6 lb

## 2024-10-02 DIAGNOSIS — D6859 Other primary thrombophilia: Secondary | ICD-10-CM

## 2024-10-02 DIAGNOSIS — K7460 Unspecified cirrhosis of liver: Secondary | ICD-10-CM | POA: Diagnosis not present

## 2024-10-02 DIAGNOSIS — I10 Essential (primary) hypertension: Secondary | ICD-10-CM | POA: Diagnosis not present

## 2024-10-02 DIAGNOSIS — R2681 Unsteadiness on feet: Secondary | ICD-10-CM

## 2024-10-02 DIAGNOSIS — I639 Cerebral infarction, unspecified: Secondary | ICD-10-CM | POA: Diagnosis not present

## 2024-10-02 DIAGNOSIS — I480 Paroxysmal atrial fibrillation: Secondary | ICD-10-CM | POA: Diagnosis not present

## 2024-10-02 DIAGNOSIS — M6281 Muscle weakness (generalized): Secondary | ICD-10-CM

## 2024-10-02 DIAGNOSIS — R2689 Other abnormalities of gait and mobility: Secondary | ICD-10-CM

## 2024-10-02 NOTE — Patient Instructions (Signed)
 Thank you for choosing [facility]!     Medication Instructions:  No medication changes were made during today's visit.  *If you need a refill on your cardiac medications before your next appointment, please call your pharmacy*   Lab Work: No labs were ordered during today's visit.  If you have labs (blood work) drawn today and your tests are completely normal, you will receive your results only by: MyChart Message (if you have MyChart) OR A paper copy in the mail If you have any lab test that is abnormal or we need to change your treatment, we will call you to review the results.   Testing/Procedures: No procedures were ordered during today's visit.   Your next appointment:   6 month(s)   Provider:   Stanly DELENA Leavens, MD     Follow-Up: At [facility], you and your health needs are our priority.  As part of our continuing mission to provide you with exceptional heart care, we have created designated Provider Care Teams.  These Care Teams include your primary Cardiologist (physician) and Advanced Practice Providers (APPs -  Physician Assistants and Nurse Practitioners) who all work together to provide you with the care you need, when you need it. We recommend signing up for the patient portal called MyChart.  Sign up information is provided on this After Visit Summary.  MyChart is used to connect with patients for Virtual Visits (Telemedicine).  Patients are able to view lab/test results, encounter notes, upcoming appointments, etc.  Non-urgent messages can be sent to your provider as well.   To learn more about what you can do with MyChart, go to [email or web address].

## 2024-10-02 NOTE — Therapy (Signed)
 OUTPATIENT PHYSICAL THERAPY NEURO TREATMENT NOTE     Patient Name: [name] MRN: [id number] DOB:Jul 03, 1951, 73 y.o., female Today's Date: 10/02/2024   PCP: [name], NP REFERRING PROVIDER: [name], NP  END OF SESSION:  PT End of Session - 10/02/24 1533     Visit Number 11    Number of Visits 17    Date for Recertification  10/06/24    Authorization Type [employer] - Auth. required    Authorization Time Period 08-08-24 - 10-03-24    Authorization - Number of Visits 17    Progress Note Due on Visit 10    PT Start Time 1357    PT Stop Time 1444    PT Time Calculation (min) 47 min    Equipment Utilized During Treatment Gait belt    Activity Tolerance Patient tolerated treatment well   c/o Rt hip pain which appears to be consistent with bursitis   Behavior During Therapy [facility] for tasks assessed/performed                Past Medical History:  Diagnosis Date   Acute hepatic encephalopathy (HCC) 03/30/2024   Acute urinary retention 05/04/2022   Allergy 1952   Contrast dye   Arthritis    Knees and thumb   Blood transfusion without reported diagnosis    Cancer [facility])    cecum   Cataract    Surgery scheduled July 2023   Colon cancer (HCC) 2003   Elevated liver function tests    Esophageal varices (HCC)    Heart murmur    Hemorrhage of gastrointestinal tract 05/04/2011   Hepatic encephalopathy (HCC) 2025   Hypertension    Hypothyroidism    Iron deficiency anemia    Liver disease    chemotherapy complication, per pt, shunts placed to bypass liver   Malignant neoplasm of cecum (HCC)    Myocardial infarction [facility]) Jan 2025   Portal hypertension (HCC)    Skin cancer 2019   Splenomegaly    Stroke [facility])    Ulcer 05/2024   Past Surgical History:  Procedure Laterality Date   COLON SURGERY     Cancer   COSMETIC SURGERY     Skin cancer   ESOPHAGEAL VARICE LIGATION     ESOPHAGOGASTRODUODENOSCOPY N/A 05/28/2024   Procedure: EGD  (ESOPHAGOGASTRODUODENOSCOPY);  Surgeon: Albertus Gordy HERO, MD;  Location: [facility];  Service: Gastroenterology;  Laterality: N/A;   EYE SURGERY     For cataracts   HEMICOLECTOMY  01/08/2003   IR RADIOLOGIST EVAL & MGMT  12/20/2020   IR RADIOLOGIST EVAL & MGMT  05/29/2021   LIVER SURGERY     shunts placed after chemo complication   SKIN FULL THICKNESS GRAFT N/A 09/12/2019   Procedure: debridement and FTSG to the nose from left upper arm;  Surgeon: Elisabeth Craig RAMAN, MD;  Location: [facility];  Service: Plastics;  Laterality: N/A;  2 hours, please   TIPS PROCEDURE     Patient Active Problem List   Diagnosis Date Noted   AMS (altered mental status) 07/01/2024   Heme positive stool 05/28/2024   Acute gastric ulcer without hemorrhage or perforation 05/28/2024   Gastritis and gastroduodenitis 05/28/2024   Acute on chronic anemia 05/27/2024   SIRS (systemic inflammatory response syndrome) (HCC) 05/21/2024   Acute prerenal azotemia 05/21/2024   Paroxysmal atrial fibrillation (HCC) 05/21/2024   Acute metabolic encephalopathy 05/20/2024   Vision disturbance 05/11/2024   Patent foramen ovale 04/16/2024   Acute  CVA (cerebrovascular accident) (HCC) 04/15/2024   Abnormal CBC 04/10/2024   Hyperkalemia 04/05/2024   Hypoglycemia 04/05/2024   Macrocytic anemia 04/05/2024   Cerebrovascular accident (CVA) (HCC) 03/16/2024   History of stroke 01/06/2024   Thrombocytopenia 12/21/2023   Decreased GFR 12/10/2023   Preventative health care 09/09/2023   Lower extremity edema 08/10/2023   Cellulitis of both lower extremities 08/04/2023   Altered mental status 08/04/2023   Bradycardia 05/07/2023   Protein-calorie malnutrition, severe 02/26/2023   Weight loss 02/24/2023   Other constipation 02/24/2023   Generalized abdominal pain 02/24/2023   Hospital discharge follow-up 02/24/2023   Weakness 01/04/2023   Confusion 08/26/2022   Frequent UTI 05/18/2022   Anemia of chronic disease  05/18/2022   AKI (acute kidney injury) 04/30/2022   Tachycardia-bradycardia syndrome (HCC) 02/05/2022   Hypomagnesemia 02/04/2022   Hepatic encephalopathy (HCC) 02/03/2022   Basal cell carcinoma (BCC) 05/06/2021   Hypertension 05/06/2021   Cirrhosis (HCC) 12/04/2020   Murmur, cardiac 03/19/2020   Acquired hypothyroidism 06/30/2019   History of basal cell cancer 06/30/2019   Hx of colon cancer, stage III 11/30/2011   Portal hypertension (HCC) 01/23/2008    ONSET DATE: 07-01-24 for most recent hospitalization  REFERRING DIAG:  Diagnosis  K76.82 (ICD-10-CM) - Hepatic encephalopathy (HCC)  R53.1 (ICD-10-CM) - Weakness    THERAPY DIAG:  Unsteadiness on feet  Other abnormalities of gait and mobility  Muscle weakness (generalized)  Rationale for Evaluation and Treatment: Rehabilitation  SUBJECTIVE:                                                                                                                                                                                             SUBJECTIVE STATEMENT:  Patient ambulates into clinic with stand alone cane and accompanied by husband. Patient feels a lot better but maybe a little sore from walking so much at the fair on Saturday and then going to [employer]. Her and her husband state to be very happy with how she is feeling and doing. She states feeling a lot better these days and feels stronger. She went to the heart doctor this morning and states that nothing has changed.  Pt accompanied by: spouse, [name]  PERTINENT HISTORY:  Per chart note 07-01-24; past medical history significant for cirrhosis, prior hepatic encephalopathy, acquired hypothyroidism, presented to hospital with altered mental status and elevated ammonia level for 5 days with generalized weakness.  hospitalized 07-01-24 - 07-03-24 with hepatic encephalopathy PMHx of  Stroke Hypothyroidism Cecal adenocarcinoma, s/p resection and chemotherapy in 2004 Liver disease,  cirrhosis,  Skin Cancer  Hypertension  PAIN:   Are you having pain?  Yes: NPRS scale: no - 0/10 Pain location: Rt lateral hip  Pain description: throbbing, sore Aggravating factors: prolonged standing/walking; have to be careful how I lay - able to lie on Rt side if heat is on it Relieving factors: [place] & heat   PRECAUTIONS: Fall  RED FLAGS: None   WEIGHT BEARING RESTRICTIONS: No  FALLS: Has patient fallen in last 6 months? No  LIVING ENVIRONMENT: Lives with: lives with their spouse Lives in: House/apartment Stairs: No Has following equipment at home: Ramped entry; SPC, RW, shower chair, manual wheelchair, BSC  PLOF: Independent with basic ADLs, Independent with household mobility with device, Independent with transfers, and Needs assistance with homemaking  PATIENT GOALS: walk without my cane; increase energy   OBJECTIVE:  Note: Objective measures were completed at Evaluation unless otherwise noted.  DIAGNOSTIC FINDINGS: CT scan 06-19-24 IMPRESSION: 1. No evidence of acute intracranial abnormality. 2. Similar prior left PCA territory infarct and chronic microvascular ischemic change.  COGNITION: Overall cognitive status: Within functional limits for tasks assessed   SENSATION: Not tested  COORDINATION: WFL's bil. LE's    POSTURE: rounded shoulders, forward head, and increased thoracic kyphosis  LOWER EXTREMITY ROM:   WFL's bil. LE's   LOWER EXTREMITY MMT:    MMT Right Eval Left Eval  Hip flexion 4 4  Hip extension    Hip abduction    Hip adduction    Hip internal rotation    Hip external rotation    Knee flexion 4 4  Knee extension 5 5  Ankle dorsiflexion 5 5  Ankle plantarflexion    Ankle inversion    Ankle eversion    (Blank rows = not tested)  BED MOBILITY:  Not tested =pt reports independent with bed mobility  TRANSFERS: Sit to stand: Modified independence  Assistive device utilized: bil. Armrests of chair     Stand to sit:  Modified independence  Assistive device utilized: bil. Armrests of chair      RAMP:  Not tested  CURB:  Not tested  STAIRS: Not tested GAIT: Findings: Gait Characteristics: step through pattern, decreased step length- Right, and decreased step length- Left, Distance walked: 75', Assistive device utilized:Single point cane, Level of assistance: CGA, and Comments: SPC with small quad tip used during eval; pt reports she is beginning to ambulate very short distances in home without use of SPC; slow gait speed   FUNCTIONAL TESTS:  5 times sit to stand: 35.88 secs without UE support - from chair Timed up and go (TUG): 19.38 secs with SPC 10 meter walk test:  20.65 secs = 1.59 ft/sec                                                                                                                                TREATMENT: 10-02-24  Therapeutic Activity: - 5 min [employer], [place], 1.0 resistance, reciprocal patterning for gait - Education on completing HEP and that some days she might  feel more fatigued than others if she was out and about more walking in town and that it might take time to recover due to overall increased activity.  - Ambulation around clinic, clinic distance, stand alone cane and SBA - Ambulation in parallel bars x 4 rounds down and back, without UE assist, CGA, no LOB and no correction needed, cueing for opposite arm and opposite leg patterning - Step negotiation x 3 rounds (24 total steps) with use of bil. Hand rails, reciprocal pattern and CGA, well tolerated, no pain - Toe taps at steps w/dual task & memory x 20 taps with random color call out 4 circle pads on steps at different heights and calling out random color to tap with foot must tap with foot on same side of color Call out more than 1 color and must remember order to hit - Sit to stand from mat table x 5 reps with SBA - Sit to stand from mat table with 4# ball overhead press (partial ROM to overhead) x 5 reps, CGA,  difficulty overhead due to balance component, well tolerated, no discomfort *Seated rest needed throughout with water due to fatigue    PATIENT EDUCATION: Education details:  Education on scheduling/balancing HEP and being more active in town Person educated: Patient and Spouse Education method: Programmer, Multimedia, Facilities Manager, and Handouts Education comprehension: verbalized understanding and needs further education  HOME EXERCISE PROGRAM: See above - [employer] [id number]  5-Minute Seated Cardio Blast - Silver Orthoptist Program (Try to do each combo continuously and take rest in between each set if needed.  You may alternate sides with each exercise to help manage fatigue with movement.)  Upper Body Combo: Sit tall in your chair and perform each move 4x. Shoulders lift up toward ears and then release down. Shoulders roll front and then back - you can use your elbows to progress movement as needed. Cross arms, then open arms. Arms reach front, then pull.  Lower Body Combo: Start this circuit with a stretch to loosen up your shoulders.  Roll your left shoulder back, then roll your right shoulder back and repeat on each side 3 times.   Perform these alternating heel taps 8x each. Tap your heel to the front, then back.  Swing the opposite arm forward with each tap. Tap your heel to the front, then back.  Extend opposite arm up toward the sky with each tap.  Side Tap Combo: Sit tall in your chair and alternate side-to-side toe taps 8x each. Alternate heel taps from side to side. Alternate heel taps from side to side and cross arms in front of you, then pull arms back.  Reach and Pull: Perform this move 16x (can do both or single arm at a time) Bring your elbows up, reach toward the front of the room, pull arms back and bring elbows down.  Single Leg Pump - Right Side: Perform this move 8x each. Pick up the speed and tap your right heel forward then back.  Pump your arms as you  move.  Double Side Tap: Tap your right toe out to the right side twice.  Bring your right arm across your body to push toward the opposite side of the room with each tap.  Then, alternate the double toe tap on each side 8x.  Double Arm Pull Down: Reach both arms up toward the sky, then pull arms down to sides.  As you pull down, squeeze your shoulder blades together and gently lift your heels from  the floor.  Repeat this move 16x.  Single Leg Pump - Left Side: Perform these moves 8x. Pick up the speed and tap your left heel forward, then back.  Pump your arms as you move.  Fast Feet: Alternate tapping your toes to the ground in small, quick movements.  Repeat 16x.  Torso Twist: Repeat this move 16x. Twist your upper body to the right, then to the left.  GOALS: Goals reviewed with patient? Yes  SHORT TERM GOALS: Target date: 09-08-24  Improve 5x sit to stand transfer score to </= 28 secs without UE support from chair to demo improved LE strength.  Baseline:  5 times sit to stand: 35.88 secs without UE support - from chair; 09-14-24: 23.31 secs without UE support Goal status: MET 09-14-24  2.  Improve TUG score to </= 16 secs with SPC to demonstrate reduced fall risk. Baseline: 19.38 secs with SPC;   09-14-24 - 21.97 secs   Goal status: Not met 09-14-24  3.  Increase gait velocity to >/= 1.9 ft/sec with use of SPC for increased gait efficiency. Baseline:  10 meter walk test:  20.65 secs = 1.59 ft/sec with SPC ;  16.57, 14.53 = 2.26 ft/sec with SPC Goal status: Goal met 09-14-24  4.  Ambulate 2' without use of SPC with supervision for household ambulation.  Baseline:  Goal status: Partially met 09-14-24  5.   Amb. 2 nonstop with use of SPC with SBA to CGA to demonstrate improved endurance/activity tolerance. Baseline: 2:03 - RPE 4-5/10 Goal status: MET 09-14-24  6.  Independent in HEP for balance and LE strengthening.  Baseline:  Goal status: Ongoing 09-14-24    LONG  TERM GOALS: Target date: 10-06-24  Improve 5x sit to stand transfer score to </= 22 secs without UE support from chair to demo improved LE strength.  Baseline:  5 times sit to stand: 35.88 secs without UE support - from chair Goal status: INITIAL  2.  Improve TUG score to </= 13.5 secs with SPC to demonstrate reduced fall risk. Baseline: 19.38 secs with SPC Goal status: INITIAL  3.  Increase Berg balance test score by at least 5 points to demo improved balance and decreased fall risk.  Baseline: TBA Goal status: INITIAL  4.  Pt will report modified independent household ambulation without use of SPC. Baseline:  Goal status: INITIAL  5.  Increase gait velocity to >/= 2.4 ft/sec with use of SPC for increased gait efficiency. Baseline:  10 meter walk test:  20.65 secs = 1.59 ft/sec with SPC  Goal status: INITIAL  6.  Amb. 4 nonstop with use of SPC with SBA for increased community accessibility.  Baseline:  Goal status: INITIAL  ASSESSMENT:  CLINICAL IMPRESSION: Patient attends skilled physical therapy today for focus on LE strength, balance and muscular endurance. Patient shows improved endurance with task completion and less breaks needed throughout. Rest breaks are still needed throughout but with lower reports of fatigue during session. Stair negotiation was improved with reciprocal stepping throughout after appropriate cueing. Step ups and toe taps were well tolerated and longer bouts of standing were completed this session. Dual tasking and memory task was used during toe taps with few corrections needed throughout. Patient continues to need LE strengthening and intermittent assist for safety due to balance deficits. Cont with POC.   OBJECTIVE IMPAIRMENTS: decreased activity tolerance, decreased balance, decreased endurance, and difficulty walking.   ACTIVITY LIMITATIONS: carrying, lifting, bending, squatting, stairs, and locomotion level  PARTICIPATION LIMITATIONS:  meal prep,  cleaning, laundry, driving, shopping, and community activity  PERSONAL FACTORS: Behavior pattern, Past/current experiences, Time since onset of injury/illness/exacerbation, and 1-2 comorbidities: h/o hepatic encephalopathy with multiple hospitalizations and h/o CVA are also affecting patient's functional outcome.   REHAB POTENTIAL: Good  CLINICAL DECISION MAKING: Evolving/moderate complexity  EVALUATION COMPLEXITY: Moderate  PLAN:  PT FREQUENCY: 2x/week  PT DURATION: 8 weeks  PLANNED INTERVENTIONS: 97110-Therapeutic exercises, 97530- Therapeutic activity, 97112- Neuromuscular re-education, [phone]- Self Care, [id number]- Gait training, and Patient/Family education  PLAN FOR NEXT SESSION: PLEASE BEGIN CHECKING LTG'S (I will do renewal - thank you!)   how are coordination exercises going at home? continue balance and gait training, core and  LE strengthening  Sit to stand with over head ball press, increase ROM  Check LTG   Emmalene Sherry, Student-PT 10/02/2024, 3:34 PM

## 2024-10-05 ENCOUNTER — Ambulatory Visit: Admitting: Physical Therapy

## 2024-10-05 DIAGNOSIS — R2681 Unsteadiness on feet: Secondary | ICD-10-CM

## 2024-10-05 DIAGNOSIS — M6281 Muscle weakness (generalized): Secondary | ICD-10-CM

## 2024-10-05 DIAGNOSIS — R2689 Other abnormalities of gait and mobility: Secondary | ICD-10-CM

## 2024-10-05 NOTE — Therapy (Signed)
 OUTPATIENT PHYSICAL THERAPY NEURO TREATMENT NOTE/RE-CERT     Patient Name: [name] MRN: [id number] DOB:Jan 25, 1951, 73 y.o., female Today's Date: 10/08/2024   PCP: [name], NP REFERRING PROVIDER: [name], NP  END OF SESSION:  PT End of Session - 10/08/24 1640     Visit Number 12    Number of Visits 17    Date for Recertification  11/22/24    Authorization Type [employer] - Auth. required    Authorization Time Period 08-08-24 - 10-03-24:  10-05-24 - 11-23-24    Authorization - Visit Number 11   visit 1/6 additional auth.   Authorization - Number of Visits 17    Progress Note Due on Visit 10    PT Start Time 1341    PT Stop Time 1440    PT Time Calculation (min) 59 min    Equipment Utilized During Treatment Gait belt    Activity Tolerance Patient tolerated treatment well   c/o Rt hip pain which appears to be consistent with bursitis   Behavior During Therapy [facility] for tasks assessed/performed                 Past Medical History:  Diagnosis Date   Acute hepatic encephalopathy (HCC) 03/30/2024   Acute urinary retention 05/04/2022   Allergy 1952   Contrast dye   Arthritis    Knees and thumb   Blood transfusion without reported diagnosis    Cancer [facility])    cecum   Cataract    Surgery scheduled July 2023   Colon cancer (HCC) 2003   Elevated liver function tests    Esophageal varices (HCC)    Heart murmur    Hemorrhage of gastrointestinal tract 05/04/2011   Hepatic encephalopathy (HCC) 2025   Hypertension    Hypothyroidism    Iron deficiency anemia    Liver disease    chemotherapy complication, per pt, shunts placed to bypass liver   Malignant neoplasm of cecum (HCC)    Myocardial infarction [facility]) Jan 2025   Portal hypertension (HCC)    Skin cancer 2019   Splenomegaly    Stroke [facility])    Ulcer 05/2024   Past Surgical History:  Procedure Laterality Date   COLON SURGERY     Cancer   COSMETIC SURGERY     Skin cancer   ESOPHAGEAL  VARICE LIGATION     ESOPHAGOGASTRODUODENOSCOPY N/A 05/28/2024   Procedure: EGD (ESOPHAGOGASTRODUODENOSCOPY);  Surgeon: Albertus Gordy HERO, MD;  Location: [facility];  Service: Gastroenterology;  Laterality: N/A;   EYE SURGERY     For cataracts   HEMICOLECTOMY  01/08/2003   IR RADIOLOGIST EVAL & MGMT  12/20/2020   IR RADIOLOGIST EVAL & MGMT  05/29/2021   LIVER SURGERY     shunts placed after chemo complication   SKIN FULL THICKNESS GRAFT N/A 09/12/2019   Procedure: debridement and FTSG to the nose from left upper arm;  Surgeon: Elisabeth Craig RAMAN, MD;  Location: [facility];  Service: Plastics;  Laterality: N/A;  2 hours, please   TIPS PROCEDURE     Patient Active Problem List   Diagnosis Date Noted   AMS (altered mental status) 07/01/2024   Heme positive stool 05/28/2024   Acute gastric ulcer without hemorrhage or perforation 05/28/2024   Gastritis and gastroduodenitis 05/28/2024   Acute on chronic anemia 05/27/2024   SIRS (systemic inflammatory response syndrome) (HCC) 05/21/2024   Acute prerenal azotemia 05/21/2024   Paroxysmal atrial fibrillation (HCC) 05/21/2024  Acute metabolic encephalopathy 05/20/2024   Vision disturbance 05/11/2024   Patent foramen ovale 04/16/2024   Acute CVA (cerebrovascular accident) (HCC) 04/15/2024   Abnormal CBC 04/10/2024   Hyperkalemia 04/05/2024   Hypoglycemia 04/05/2024   Macrocytic anemia 04/05/2024   Cerebrovascular accident (CVA) (HCC) 03/16/2024   History of stroke 01/06/2024   Thrombocytopenia 12/21/2023   Decreased GFR 12/10/2023   Preventative health care 09/09/2023   Lower extremity edema 08/10/2023   Cellulitis of both lower extremities 08/04/2023   Altered mental status 08/04/2023   Bradycardia 05/07/2023   Protein-calorie malnutrition, severe 02/26/2023   Weight loss 02/24/2023   Other constipation 02/24/2023   Generalized abdominal pain 02/24/2023   Hospital discharge follow-up 02/24/2023   Weakness 01/04/2023    Confusion 08/26/2022   Frequent UTI 05/18/2022   Anemia of chronic disease 05/18/2022   AKI (acute kidney injury) 04/30/2022   Tachycardia-bradycardia syndrome (HCC) 02/05/2022   Hypomagnesemia 02/04/2022   Hepatic encephalopathy (HCC) 02/03/2022   Basal cell carcinoma (BCC) 05/06/2021   Hypertension 05/06/2021   Cirrhosis (HCC) 12/04/2020   Murmur, cardiac 03/19/2020   Acquired hypothyroidism 06/30/2019   History of basal cell cancer 06/30/2019   Hx of colon cancer, stage III 11/30/2011   Portal hypertension (HCC) 01/23/2008    ONSET DATE: 07-01-24 for most recent hospitalization  REFERRING DIAG:  Diagnosis  K76.82 (ICD-10-CM) - Hepatic encephalopathy (HCC)  R53.1 (ICD-10-CM) - Weakness    THERAPY DIAG:  Unsteadiness on feet  Other abnormalities of gait and mobility  Muscle weakness (generalized)  Rationale for Evaluation and Treatment: Rehabilitation  SUBJECTIVE:                                                                                                                                                                                             SUBJECTIVE STATEMENT:  Husband reports that patient walked 6 8 secs yesterday - without stopping; husband says she was tired but she did OK; says 2nd lap was slower than the 1st but she was able to do it. He says she is doing so much better; agrees to decrease frequency to 1x/week for the remaining 5 visits   Pt accompanied by: spouse, [name]  PERTINENT HISTORY:  Per chart note 07-01-24; past medical history significant for cirrhosis, prior hepatic encephalopathy, acquired hypothyroidism, presented to hospital with altered mental status and elevated ammonia level for 5 days with generalized weakness.  hospitalized 07-01-24 - 07-03-24 with hepatic encephalopathy PMHx of  Stroke Hypothyroidism Cecal adenocarcinoma, s/p resection and chemotherapy in 2004 Liver disease, cirrhosis,  Skin Cancer  Hypertension  PAIN:   Are you  having pain? Yes: NPRS scale:  no - 0/10 Pain location: Rt lateral hip  Pain description: throbbing, sore Aggravating factors: prolonged standing/walking; have to be careful how I lay - able to lie on Rt side if heat is on it Relieving factors: [place] & heat   PRECAUTIONS: Fall  RED FLAGS: None   WEIGHT BEARING RESTRICTIONS: No  FALLS: Has patient fallen in last 6 months? No  LIVING ENVIRONMENT: Lives with: lives with their spouse Lives in: House/apartment Stairs: No Has following equipment at home: Ramped entry; SPC, RW, shower chair, manual wheelchair, BSC  PLOF: Independent with basic ADLs, Independent with household mobility with device, Independent with transfers, and Needs assistance with homemaking  PATIENT GOALS: walk without my cane; increase energy   OBJECTIVE:  Note: Objective measures were completed at Evaluation unless otherwise noted.  DIAGNOSTIC FINDINGS: CT scan 06-19-24 IMPRESSION: 1. No evidence of acute intracranial abnormality. 2. Similar prior left PCA territory infarct and chronic microvascular ischemic change.  COGNITION: Overall cognitive status: Within functional limits for tasks assessed   SENSATION: Not tested  COORDINATION: WFL's bil. LE's    POSTURE: rounded shoulders, forward head, and increased thoracic kyphosis  LOWER EXTREMITY ROM:   WFL's bil. LE's   LOWER EXTREMITY MMT:    MMT Right Eval Left Eval  Hip flexion 4 4  Hip extension    Hip abduction    Hip adduction    Hip internal rotation    Hip external rotation    Knee flexion 4 4  Knee extension 5 5  Ankle dorsiflexion 5 5  Ankle plantarflexion    Ankle inversion    Ankle eversion    (Blank rows = not tested)  BED MOBILITY:  Not tested =pt reports independent with bed mobility  TRANSFERS: Sit to stand: Modified independence  Assistive device utilized: bil. Armrests of chair     Stand to sit: Modified independence  Assistive device utilized: bil. Armrests of  chair      RAMP:  Not tested  CURB:  Not tested  STAIRS: Not tested GAIT: Findings: Gait Characteristics: step through pattern, decreased step length- Right, and decreased step length- Left, Distance walked: 75', Assistive device utilized:Single point cane, Level of assistance: CGA, and Comments: SPC with small quad tip used during eval; pt reports she is beginning to ambulate very short distances in home without use of SPC; slow gait speed   FUNCTIONAL TESTS:  5 times sit to stand: 35.88 secs without UE support - from chair Timed up and go (TUG): 19.38 secs with SPC 10 meter walk test:  20.65 secs = 1.59 ft/sec                                                                                                                                TREATMENT:  10-05-24   TherEx: 5x sit to stand from chair = 21.84 secs  TherAct:  TUG score = 20.82 secs with [facility]    10/05/24 0001  Berg Balance Test  Sit to Stand 4  Standing Unsupported 4  Sitting with Back Unsupported but Feet Supported on Floor or Stool 4  Stand to Sit 4  Transfers 4  Standing Unsupported with Eyes Closed 4  Standing Unsupported with Feet Together 4  From Standing, Reach Forward with Outstretched Arm 4  From Standing Position, Pick up Object from Floor 4  From Standing Position, Turn to Look Behind Over each Shoulder 4  Turn 360 Degrees 2 (R= 6.56   Lt = 7.00)  Standing Unsupported, Alternately Place Feet on Step/Stool 2  Standing Unsupported, One Foot in Front 2  Standing on One Leg 2  Total Score 48   Gait:  Pt reports amb. Approx. 50% time amb. Without SPC at home  Gait velocity:   16.9 secs with SPC  = 1.94 ft/sec   Self Care:  Discussed LTG's and progress; discussed revision of goals not met with new goal set for next cert. period   PATIENT EDUCATION: Education details:  Education on scheduling/balancing HEP and being more active in town Person educated: Patient and Spouse Education method:  Programmer, Multimedia, Facilities Manager, and Handouts Education comprehension: verbalized understanding and needs further education  HOME EXERCISE PROGRAM: See above - [employer] [id number]  5-Minute Seated Cardio Blast - Silver Orthoptist Program (Try to do each combo continuously and take rest in between each set if needed.  You may alternate sides with each exercise to help manage fatigue with movement.)  Upper Body Combo: Sit tall in your chair and perform each move 4x. Shoulders lift up toward ears and then release down. Shoulders roll front and then back - you can use your elbows to progress movement as needed. Cross arms, then open arms. Arms reach front, then pull.  Lower Body Combo: Start this circuit with a stretch to loosen up your shoulders.  Roll your left shoulder back, then roll your right shoulder back and repeat on each side 3 times.   Perform these alternating heel taps 8x each. Tap your heel to the front, then back.  Swing the opposite arm forward with each tap. Tap your heel to the front, then back.  Extend opposite arm up toward the sky with each tap.  Side Tap Combo: Sit tall in your chair and alternate side-to-side toe taps 8x each. Alternate heel taps from side to side. Alternate heel taps from side to side and cross arms in front of you, then pull arms back.  Reach and Pull: Perform this move 16x (can do both or single arm at a time) Bring your elbows up, reach toward the front of the room, pull arms back and bring elbows down.  Single Leg Pump - Right Side: Perform this move 8x each. Pick up the speed and tap your right heel forward then back.  Pump your arms as you move.  Double Side Tap: Tap your right toe out to the right side twice.  Bring your right arm across your body to push toward the opposite side of the room with each tap.  Then, alternate the double toe tap on each side 8x.  Double Arm Pull Down: Reach both arms up toward the sky, then pull arms down  to sides.  As you pull down, squeeze your shoulder blades together and gently lift your heels from the floor.  Repeat this move 16x.  Single Leg Pump - Left Side: Perform these moves 8x. Pick up the speed and tap your left heel forward, then back.  Pump your arms as you move.  Fast Feet: Alternate tapping your toes to the ground in small, quick movements.  Repeat 16x.  Torso Twist: Repeat this move 16x. Twist your upper body to the right, then to the left.  GOALS: Goals reviewed with patient? Yes  SHORT TERM GOALS: Target date: 09-08-24  Improve 5x sit to stand transfer score to </= 28 secs without UE support from chair to demo improved LE strength.  Baseline:  5 times sit to stand: 35.88 secs without UE support - from chair; 09-14-24: 23.31 secs without UE support Goal status: MET 09-14-24  2.  Improve TUG score to </= 16 secs with SPC to demonstrate reduced fall risk. Baseline: 19.38 secs with SPC;   09-14-24 - 21.97 secs   Goal status: Not met 09-14-24  3.  Increase gait velocity to >/= 1.9 ft/sec with use of SPC for increased gait efficiency. Baseline:  10 meter walk test:  20.65 secs = 1.59 ft/sec with SPC ;  16.57, 14.53 = 2.26 ft/sec with SPC Goal status: Goal met 09-14-24  4.  Ambulate 54' without use of SPC with supervision for household ambulation.  Baseline:  Goal status: Partially met 09-14-24  5.   Amb. 2 nonstop with use of SPC with SBA to CGA to demonstrate improved endurance/activity tolerance. Baseline: 2:03 - RPE 4-5/10 Goal status: MET 09-14-24  6.  Independent in HEP for balance and LE strengthening.  Baseline:  Goal status: Ongoing 09-14-24    LONG TERM GOALS: Target date: 10-06-24  Improve 5x sit to stand transfer score to </= 22 secs without UE support from chair to demo improved LE strength.  Baseline:  5 times sit to stand: 35.88 secs without UE support - from chair;  21.84 secs Goal status:  Goal met 10-05-24  2.  Improve TUG score to </=  13.5 secs with SPC to demonstrate reduced fall risk. Baseline: 19.38 secs with SPC; 20.82 secs with SPC Goal status: Ongoing (goal <16 sec)  3.  Increase Berg balance test score by at least 5 points to demo improved balance and decreased fall risk.  Baseline: TBA;  42/56, 48/56 Goal status: Goal met 10-05-24  4.  Pt will report modified independent household ambulation without use of SPC. Baseline:  Goal status:  Ongoing (50% time amb. Without SPC)  5.  Increase gait velocity to >/= 2.4 ft/sec with use of SPC for increased gait efficiency. Baseline:  10 meter walk test:  20.65 secs = 1.59 ft/sec with SPC;  16.9 = 1.94 ft/sec  Goal status: Ongoing   6.  Amb. 4 nonstop with use of SPC with SBA for increased community accessibility.  Baseline:  Goal status:  Goal met per husband's report 10-05-24   UPDATED LONG TERM GOALS: Target date: 11-22-24   Improve 5x sit to stand transfer score to </= 18 secs without UE support from chair to demo improved LE strength.   Baseline:  5 times sit to stand: 35.88 secs without UE support - from chair;   Goal status: Upgraded   2.  Improve TUG score to </= 16 secs with SPC to demonstrate reduced fall risk.   Baseline: 19.38 secs with SPC; 20.82 secs with SPC    Goal status: Revised  3.  Increase Berg balance test score to >/= 50/56 to reduce fall risk.       Baseline: TBA;  42/56, 48/56 on 10-05-24      Goal status: Upgraded   4.  Pt will  report modified independent household ambulation at least 50% time without use of SPC.       Baseline:        Goal status:  Ongoing   5.  Increase gait velocity to >/= 2.4 ft/sec with use of SPC for increased gait efficiency.       Baseline:  10 meter walk test:  20.65 secs = 1.59 ft/sec with SPC;  16.9 = 1.94 ft/sec        Goal status: Ongoing   6.  Amb. 6 nonstop with use of SPC with SBA for increased community accessibility.  Baseline:  Goal status: Upgraded     ASSESSMENT:  CLINICAL  IMPRESSION: PT session focused on assessment of LTG's for recertification.  Pt has met LTG's #1, 3 and 6:  LTG #2 is ongoing as TUG score = 20.82 secs with use of SPC (goal set at </= 13.5 secs):  LTG #4 is ongoing as pt continues to use [facility] for assistance with household amb. To reduce fall risk (is not using in kitchen and bathroom as close to objects/counters, etc.);  LTG #5 is ongoing as gait velocity = 1.94 ft/sec with SPC (goal set at >/= 2.4 ft/sec).  Pt is progressing well. Cont with POC.   OBJECTIVE IMPAIRMENTS: decreased activity tolerance, decreased balance, decreased endurance, and difficulty walking.   ACTIVITY LIMITATIONS: carrying, lifting, bending, squatting, stairs, and locomotion level  PARTICIPATION LIMITATIONS: meal prep, cleaning, laundry, driving, shopping, and community activity  PERSONAL FACTORS: Behavior pattern, Past/current experiences, Time since onset of injury/illness/exacerbation, and 1-2 comorbidities: h/o hepatic encephalopathy with multiple hospitalizations and h/o CVA are also affecting patient's functional outcome.   REHAB POTENTIAL: Good  CLINICAL DECISION MAKING: Evolving/moderate complexity  EVALUATION COMPLEXITY: Moderate  PLAN:  PT FREQUENCY: 1x/week  PT DURATION: 5 weeks (5 additional sessions) per renewal   PLANNED INTERVENTIONS: 97110-Therapeutic exercises, 97530- Therapeutic activity, [id number]- Neuromuscular re-education, [phone]- Self Care, [id number]- Gait training, and Patient/Family education  PLAN FOR NEXT SESSION: Cont balance and LE strengthening; gait training without use of SPC   Jerrik Housholder, [place], PT 10/08/2024, 5:13 PM

## 2024-10-08 ENCOUNTER — Encounter: Payer: Self-pay | Admitting: Physical Therapy

## 2024-10-08 NOTE — Progress Notes (Signed)
   10/05/24 0001  Berg Balance Test  Sit to Stand 4  Standing Unsupported 4  Sitting with Back Unsupported but Feet Supported on Floor or Stool 4  Stand to Sit 4  Transfers 4  Standing Unsupported with Eyes Closed 4  Standing Unsupported with Feet Together 4  From Standing, Reach Forward with Outstretched Arm 4  From Standing Position, Pick up Object from Floor 4  From Standing Position, Turn to Look Behind Over each Shoulder 4  Turn 360 Degrees 2 (R= 6.56   Lt = 7.00)  Standing Unsupported, Alternately Place Feet on Step/Stool 2  Standing Unsupported, One Foot in Front 2  Standing on One Leg 2  Total Score 48

## 2024-10-09 ENCOUNTER — Ambulatory Visit: Admitting: Physical Therapy

## 2024-10-11 ENCOUNTER — Other Ambulatory Visit: Payer: Self-pay | Admitting: Nurse Practitioner

## 2024-10-11 DIAGNOSIS — Z1231 Encounter for screening mammogram for malignant neoplasm of breast: Secondary | ICD-10-CM

## 2024-10-12 ENCOUNTER — Ambulatory Visit: Admitting: Physical Therapy

## 2024-10-12 ENCOUNTER — Encounter: Payer: Self-pay | Admitting: Physical Therapy

## 2024-10-12 DIAGNOSIS — R2689 Other abnormalities of gait and mobility: Secondary | ICD-10-CM

## 2024-10-12 DIAGNOSIS — R2681 Unsteadiness on feet: Secondary | ICD-10-CM | POA: Diagnosis not present

## 2024-10-12 DIAGNOSIS — M6281 Muscle weakness (generalized): Secondary | ICD-10-CM

## 2024-10-12 NOTE — Therapy (Signed)
 OUTPATIENT PHYSICAL THERAPY NEURO TREATMENT NOTE     Patient Name: [name] MRN: [id number] DOB:[birth year], 73 y.o., female Today's Date: 10/12/2024   PCP: [name], NP REFERRING PROVIDER: [name], NP  END OF SESSION:  PT End of Session - 10/12/24 2016     Visit Number 13    Number of Visits 17    Date for Recertification  11/22/24    Authorization Type [employer] - Auth. required    Authorization Time Period 08-08-24 - 10-03-24:  10-05-24 - 11-23-24    Authorization - Visit Number 12   visit 2/6 additional visits authorized   Authorization - Number of Visits 17    Progress Note Due on Visit 10    PT Start Time 1401    PT Stop Time 1450    PT Time Calculation (min) 49 min    Equipment Utilized During Treatment Gait belt    Activity Tolerance Patient tolerated treatment well   c/o Rt hip pain which appears to be consistent with bursitis   Behavior During Therapy [facility] for tasks assessed/performed                  Past Medical History:  Diagnosis Date   Acute hepatic encephalopathy (HCC) 03/30/2024   Acute urinary retention 05/04/2022   Allergy 1952   Contrast dye   Arthritis    Knees and thumb   Blood transfusion without reported diagnosis    Cancer [facility])    cecum   Cataract    Surgery scheduled July 2023   Colon cancer (HCC) 2003   Elevated liver function tests    Esophageal varices (HCC)    Heart murmur    Hemorrhage of gastrointestinal tract 05/04/2011   Hepatic encephalopathy (HCC) 2025   Hypertension    Hypothyroidism    Iron deficiency anemia    Liver disease    chemotherapy complication, per pt, shunts placed to bypass liver   Malignant neoplasm of cecum (HCC)    Myocardial infarction [facility]) Jan 2025   Portal hypertension (HCC)    Skin cancer 2019   Splenomegaly    Stroke [facility])    Ulcer 05/2024   Past Surgical History:  Procedure Laterality Date   COLON SURGERY     Cancer   COSMETIC SURGERY     Skin cancer    ESOPHAGEAL VARICE LIGATION     ESOPHAGOGASTRODUODENOSCOPY N/A 05/28/2024   Procedure: EGD (ESOPHAGOGASTRODUODENOSCOPY);  Surgeon: Albertus Gordy HERO, MD;  Location: [facility];  Service: Gastroenterology;  Laterality: N/A;   EYE SURGERY     For cataracts   HEMICOLECTOMY  01/08/2003   IR RADIOLOGIST EVAL & MGMT  12/20/2020   IR RADIOLOGIST EVAL & MGMT  05/29/2021   LIVER SURGERY     shunts placed after chemo complication   SKIN FULL THICKNESS GRAFT N/A 09/12/2019   Procedure: debridement and FTSG to the nose from left upper arm;  Surgeon: Elisabeth Craig RAMAN, MD;  Location: [facility];  Service: Plastics;  Laterality: N/A;  2 hours, please   TIPS PROCEDURE     Patient Active Problem List   Diagnosis Date Noted   AMS (altered mental status) 07/01/2024   Heme positive stool 05/28/2024   Acute gastric ulcer without hemorrhage or perforation 05/28/2024   Gastritis and gastroduodenitis 05/28/2024   Acute on chronic anemia 05/27/2024   SIRS (systemic inflammatory response syndrome) (HCC) 05/21/2024   Acute prerenal azotemia 05/21/2024   Paroxysmal atrial fibrillation (HCC) 05/21/2024  Acute metabolic encephalopathy 05/20/2024   Vision disturbance 05/11/2024   Patent foramen ovale 04/16/2024   Acute CVA (cerebrovascular accident) (HCC) 04/15/2024   Abnormal CBC 04/10/2024   Hyperkalemia 04/05/2024   Hypoglycemia 04/05/2024   Macrocytic anemia 04/05/2024   Cerebrovascular accident (CVA) (HCC) 03/16/2024   History of stroke 01/06/2024   Thrombocytopenia 12/21/2023   Decreased GFR 12/10/2023   Preventative health care 09/09/2023   Lower extremity edema 08/10/2023   Cellulitis of both lower extremities 08/04/2023   Altered mental status 08/04/2023   Bradycardia 05/07/2023   Protein-calorie malnutrition, severe 02/26/2023   Weight loss 02/24/2023   Other constipation 02/24/2023   Generalized abdominal pain 02/24/2023   Hospital discharge follow-up 02/24/2023   Weakness  01/04/2023   Confusion 08/26/2022   Frequent UTI 05/18/2022   Anemia of chronic disease 05/18/2022   AKI (acute kidney injury) 04/30/2022   Tachycardia-bradycardia syndrome (HCC) 02/05/2022   Hypomagnesemia 02/04/2022   Hepatic encephalopathy (HCC) 02/03/2022   Basal cell carcinoma (BCC) 05/06/2021   Hypertension 05/06/2021   Cirrhosis (HCC) 12/04/2020   Murmur, cardiac 03/19/2020   Acquired hypothyroidism 06/30/2019   History of basal cell cancer 06/30/2019   Hx of colon cancer, stage III 11/30/2011   Portal hypertension (HCC) 01/23/2008    ONSET DATE: 07-01-24 for most recent hospitalization  REFERRING DIAG:  Diagnosis  K76.82 (ICD-10-CM) - Hepatic encephalopathy (HCC)  R53.1 (ICD-10-CM) - Weakness    THERAPY DIAG:  Unsteadiness on feet  Other abnormalities of gait and mobility  Muscle weakness (generalized)  Rationale for Evaluation and Treatment: Rehabilitation  SUBJECTIVE:                                                                                                                                                                                             SUBJECTIVE STATEMENT:  Husband reports that patient is doing well - walked 9:40 on Tuesday at church gym.  Continues to do exercises at home.   Pt accompanied by: spouse, [name]  PERTINENT HISTORY:  Per chart note 07-01-24; past medical history significant for cirrhosis, prior hepatic encephalopathy, acquired hypothyroidism, presented to hospital with altered mental status and elevated ammonia level for 5 days with generalized weakness.  hospitalized 07-01-24 - 07-03-24 with hepatic encephalopathy PMHx of  Stroke Hypothyroidism Cecal adenocarcinoma, s/p resection and chemotherapy in 2004 Liver disease, cirrhosis,  Skin Cancer  Hypertension  PAIN:   Are you having pain? Yes: NPRS scale: no - 0/10 Pain location: Rt lateral hip  Pain description: throbbing, sore Aggravating factors: prolonged  standing/walking; have to be careful how I lay - able to lie on Rt side if heat  is on it Relieving factors: [place] & heat   PRECAUTIONS: Fall  RED FLAGS: None   WEIGHT BEARING RESTRICTIONS: No  FALLS: Has patient fallen in last 6 months? No  LIVING ENVIRONMENT: Lives with: lives with their spouse Lives in: House/apartment Stairs: No Has following equipment at home: Ramped entry; SPC, RW, shower chair, manual wheelchair, BSC  PLOF: Independent with basic ADLs, Independent with household mobility with device, Independent with transfers, and Needs assistance with homemaking  PATIENT GOALS: walk without my cane; increase energy   OBJECTIVE:  Note: Objective measures were completed at Evaluation unless otherwise noted.  DIAGNOSTIC FINDINGS: CT scan 06-19-24 IMPRESSION: 1. No evidence of acute intracranial abnormality. 2. Similar prior left PCA territory infarct and chronic microvascular ischemic change.  COGNITION: Overall cognitive status: Within functional limits for tasks assessed   SENSATION: Not tested  COORDINATION: WFL's bil. LE's    POSTURE: rounded shoulders, forward head, and increased thoracic kyphosis  LOWER EXTREMITY ROM:   WFL's bil. LE's   LOWER EXTREMITY MMT:    MMT Right Eval Left Eval  Hip flexion 4 4  Hip extension    Hip abduction    Hip adduction    Hip internal rotation    Hip external rotation    Knee flexion 4 4  Knee extension 5 5  Ankle dorsiflexion 5 5  Ankle plantarflexion    Ankle inversion    Ankle eversion    (Blank rows = not tested)  BED MOBILITY:  Not tested =pt reports independent with bed mobility  TRANSFERS: Sit to stand: Modified independence  Assistive device utilized: bil. Armrests of chair     Stand to sit: Modified independence  Assistive device utilized: bil. Armrests of chair      RAMP:  Not tested  CURB:  Not tested  STAIRS: Not tested GAIT: Findings: Gait Characteristics: step through pattern,  decreased step length- Right, and decreased step length- Left, Distance walked: 75', Assistive device utilized:Single point cane, Level of assistance: CGA, and Comments: SPC with small quad tip used during eval; pt reports she is beginning to ambulate very short distances in home without use of SPC; slow gait speed   FUNCTIONAL TESTS:  5 times sit to stand: 35.88 secs without UE support - from chair Timed up and go (TUG): 19.38 secs with SPC 10 meter walk test:  20.65 secs = 1.59 ft/sec                                                                                                                                TREATMENT:  10-12-24  TherEx: Pt performed standing hip and knee flexion with 2# weight - performed touching mat table edge 10 reps with each LE with CGA Standing hip abduction and hip extension with 2# weight 10 reps each leg - UE support on chair Step ups onto 6 step with UE support prn 10 reps each LE with CGA  TherAct:  Pt performed kicking bean bags (3) approx. 75'  with each foot  - alternating between LLE and RLE - CGA for safety Sit to stand feet on Airex - 5 reps without UE support with CGA Pt performed marching on Airex 10 reps with CGA to min assist Pt performed stepping down to floor from Airex with min assist for balance - 5 reps each LE Pt amb. Approx. 20' x 2 reps tossing and catching ball for improved multi-tasking with gait Rockerboard inside // bars with bil. UE support 10 reps; 1 UE support 10 reps; no UE support 10 reps with min assist for balance  Gait:  Pt gait trained 115' without device with CGA - cues to increase step length and to increase arm swing   PATIENT EDUCATION: Education details: reviewed HEP Person educated: Patient and Spouse Education method: Explanation, Demonstration, and Handouts Education comprehension: verbalized understanding and needs further education  HOME EXERCISE PROGRAM: See above - [employer] [id number]  5-Minute Seated  Cardio Blast - Silver Orthoptist Program (Try to do each combo continuously and take rest in between each set if needed.  You may alternate sides with each exercise to help manage fatigue with movement.)  Upper Body Combo: Sit tall in your chair and perform each move 4x. Shoulders lift up toward ears and then release down. Shoulders roll front and then back - you can use your elbows to progress movement as needed. Cross arms, then open arms. Arms reach front, then pull.  Lower Body Combo: Start this circuit with a stretch to loosen up your shoulders.  Roll your left shoulder back, then roll your right shoulder back and repeat on each side 3 times.   Perform these alternating heel taps 8x each. Tap your heel to the front, then back.  Swing the opposite arm forward with each tap. Tap your heel to the front, then back.  Extend opposite arm up toward the sky with each tap.  Side Tap Combo: Sit tall in your chair and alternate side-to-side toe taps 8x each. Alternate heel taps from side to side. Alternate heel taps from side to side and cross arms in front of you, then pull arms back.  Reach and Pull: Perform this move 16x (can do both or single arm at a time) Bring your elbows up, reach toward the front of the room, pull arms back and bring elbows down.  Single Leg Pump - Right Side: Perform this move 8x each. Pick up the speed and tap your right heel forward then back.  Pump your arms as you move.  Double Side Tap: Tap your right toe out to the right side twice.  Bring your right arm across your body to push toward the opposite side of the room with each tap.  Then, alternate the double toe tap on each side 8x.  Double Arm Pull Down: Reach both arms up toward the sky, then pull arms down to sides.  As you pull down, squeeze your shoulder blades together and gently lift your heels from the floor.  Repeat this move 16x.  Single Leg Pump - Left Side: Perform these moves 8x. Pick  up the speed and tap your left heel forward, then back.  Pump your arms as you move.  Fast Feet: Alternate tapping your toes to the ground in small, quick movements.  Repeat 16x.  Torso Twist: Repeat this move 16x. Twist your upper body to the right, then to the left.  GOALS: Goals reviewed with patient?  Yes  SHORT TERM GOALS: Target date: 09-08-24  Improve 5x sit to stand transfer score to </= 28 secs without UE support from chair to demo improved LE strength.  Baseline:  5 times sit to stand: 35.88 secs without UE support - from chair; 09-14-24: 23.31 secs without UE support Goal status: MET 09-14-24  2.  Improve TUG score to </= 16 secs with SPC to demonstrate reduced fall risk. Baseline: 19.38 secs with SPC;   09-14-24 - 21.97 secs   Goal status: Not met 09-14-24  3.  Increase gait velocity to >/= 1.9 ft/sec with use of SPC for increased gait efficiency. Baseline:  10 meter walk test:  20.65 secs = 1.59 ft/sec with SPC ;  16.57, 14.53 = 2.26 ft/sec with SPC Goal status: Goal met 09-14-24  4.  Ambulate 35' without use of SPC with supervision for household ambulation.  Baseline:  Goal status: Partially met 09-14-24  5.   Amb. 2 nonstop with use of SPC with SBA to CGA to demonstrate improved endurance/activity tolerance. Baseline: 2:03 - RPE 4-5/10 Goal status: MET 09-14-24  6.  Independent in HEP for balance and LE strengthening.  Baseline:  Goal status: Ongoing 09-14-24    LONG TERM GOALS: Target date: 10-06-24  Improve 5x sit to stand transfer score to </= 22 secs without UE support from chair to demo improved LE strength.  Baseline:  5 times sit to stand: 35.88 secs without UE support - from chair;  21.84 secs Goal status:  Goal met 10-05-24  2.  Improve TUG score to </= 13.5 secs with SPC to demonstrate reduced fall risk. Baseline: 19.38 secs with SPC; 20.82 secs with SPC Goal status: Ongoing (goal <16 sec)  3.  Increase Berg balance test score by at least 5  points to demo improved balance and decreased fall risk.  Baseline: TBA;  42/56, 48/56 Goal status: Goal met 10-05-24  4.  Pt will report modified independent household ambulation without use of SPC. Baseline:  Goal status:  Ongoing (50% time amb. Without SPC)  5.  Increase gait velocity to >/= 2.4 ft/sec with use of SPC for increased gait efficiency. Baseline:  10 meter walk test:  20.65 secs = 1.59 ft/sec with SPC;  16.9 = 1.94 ft/sec  Goal status: Ongoing   6.  Amb. 4 nonstop with use of SPC with SBA for increased community accessibility.  Baseline:  Goal status:  Goal met per husband's report 10-05-24   UPDATED LONG TERM GOALS: Target date: 11-22-24   Improve 5x sit to stand transfer score to </= 18 secs without UE support from chair to demo improved LE strength.   Baseline:  5 times sit to stand: 35.88 secs without UE support - from chair;   Goal status: Upgraded   2.  Improve TUG score to </= 16 secs with SPC to demonstrate reduced fall risk.   Baseline: 19.38 secs with SPC; 20.82 secs with SPC    Goal status: Revised  3.  Increase Berg balance test score to >/= 50/56 to reduce fall risk.       Baseline: TBA;  42/56, 48/56 on 10-05-24      Goal status: Upgraded   4.  Pt will report modified independent household ambulation at least 50% time without use of SPC.       Baseline:        Goal status:  Ongoing   5.  Increase gait velocity to >/= 2.4 ft/sec with use of SPC for  increased gait efficiency.       Baseline:  10 meter walk test:  20.65 secs = 1.59 ft/sec with SPC;  16.9 = 1.94 ft/sec        Goal status: Ongoing   6.  Amb. 6 nonstop with use of SPC with SBA for increased community accessibility.  Baseline:  Goal status: Upgraded     ASSESSMENT:  CLINICAL IMPRESSION: PT session focused on LE strengthening with use of 2# weight on each leg for PRE's and dynamic balance activities to improve SLS and multi-tasking with gait.  Pt had difficulty kicking bean bags  initially but improved with practice and repetition.  Pt reported minimal Rt hip discomfort after completing standing balance exercises (stated mild intensity) - relieved with seated rest period.  Pt is progressing well. Cont with POC.   OBJECTIVE IMPAIRMENTS: decreased activity tolerance, decreased balance, decreased endurance, and difficulty walking.   ACTIVITY LIMITATIONS: carrying, lifting, bending, squatting, stairs, and locomotion level  PARTICIPATION LIMITATIONS: meal prep, cleaning, laundry, driving, shopping, and community activity  PERSONAL FACTORS: Behavior pattern, Past/current experiences, Time since onset of injury/illness/exacerbation, and 1-2 comorbidities: h/o hepatic encephalopathy with multiple hospitalizations and h/o CVA are also affecting patient's functional outcome.   REHAB POTENTIAL: Good  CLINICAL DECISION MAKING: Evolving/moderate complexity  EVALUATION COMPLEXITY: Moderate  PLAN:  PT FREQUENCY: 1x/week  PT DURATION: 5 weeks (5 additional sessions) per renewal   PLANNED INTERVENTIONS: 97110-Therapeutic exercises, 97530- Therapeutic activity, [id number]- Neuromuscular re-education, [phone]- Self Care, [id number]- Gait training, and Patient/Family education  PLAN FOR NEXT SESSION: Cont balance and LE strengthening; gait training without use of SPC, dynamic gait activities   Monserat Prestigiacomo, [place], PT 10/12/2024, 8:22 PM

## 2024-10-24 ENCOUNTER — Other Ambulatory Visit: Payer: Self-pay | Admitting: Nurse Practitioner

## 2024-10-24 DIAGNOSIS — K7469 Other cirrhosis of liver: Secondary | ICD-10-CM

## 2024-10-24 DIAGNOSIS — K7682 Hepatic encephalopathy: Secondary | ICD-10-CM

## 2024-10-24 DIAGNOSIS — K766 Portal hypertension: Secondary | ICD-10-CM

## 2024-10-24 DIAGNOSIS — R600 Localized edema: Secondary | ICD-10-CM

## 2024-10-24 DIAGNOSIS — I8510 Secondary esophageal varices without bleeding: Secondary | ICD-10-CM

## 2024-10-25 ENCOUNTER — Ambulatory Visit: Payer: Self-pay | Attending: Nurse Practitioner | Admitting: Physical Therapy

## 2024-10-25 ENCOUNTER — Encounter: Payer: Self-pay | Admitting: Physical Therapy

## 2024-10-25 DIAGNOSIS — R262 Difficulty in walking, not elsewhere classified: Secondary | ICD-10-CM | POA: Insufficient documentation

## 2024-10-25 DIAGNOSIS — M6281 Muscle weakness (generalized): Secondary | ICD-10-CM | POA: Insufficient documentation

## 2024-10-25 DIAGNOSIS — R278 Other lack of coordination: Secondary | ICD-10-CM | POA: Insufficient documentation

## 2024-10-25 DIAGNOSIS — R2681 Unsteadiness on feet: Secondary | ICD-10-CM | POA: Insufficient documentation

## 2024-10-25 DIAGNOSIS — R2689 Other abnormalities of gait and mobility: Secondary | ICD-10-CM | POA: Diagnosis present

## 2024-10-25 NOTE — Therapy (Signed)
 OUTPATIENT PHYSICAL THERAPY NEURO TREATMENT NOTE     Patient Name: [name] MRN: [id number] DOB:1951/11/13, 73 y.o., female Today's Date: 10/25/2024   PCP: [name], NP REFERRING PROVIDER: [name], NP  END OF SESSION:  PT End of Session - 10/25/24 1535     Visit Number 14    Number of Visits 17    Date for Recertification  11/22/24    Authorization Type [employer] - Auth. required    Authorization Time Period 08-08-24 - 10-03-24:  10-05-24 - 11-23-24    Authorization - Visit Number 13    Authorization - Number of Visits 17    Progress Note Due on Visit 10    PT Start Time 1533    PT Stop Time 1617    PT Time Calculation (min) 44 min    Equipment Utilized During Treatment Gait belt    Activity Tolerance Patient tolerated treatment well   c/o Rt hip pain which appears to be consistent with bursitis   Behavior During Therapy [facility] for tasks assessed/performed                  Past Medical History:  Diagnosis Date   Acute hepatic encephalopathy (HCC) 03/30/2024   Acute urinary retention 05/04/2022   Allergy 1952   Contrast dye   Arthritis    Knees and thumb   Blood transfusion without reported diagnosis    Cancer [facility]    cecum   Cataract    Surgery scheduled July 2023   Colon cancer (HCC) 2003   Elevated liver function tests    Esophageal varices (HCC)    Heart murmur    Hemorrhage of gastrointestinal tract 05/04/2011   Hepatic encephalopathy (HCC) 2025   Hypertension    Hypothyroidism    Iron deficiency anemia    Liver disease    chemotherapy complication, per pt, shunts placed to bypass liver   Malignant neoplasm of cecum (HCC)    Myocardial infarction [facility]) Jan 2025   Portal hypertension (HCC)    Skin cancer 2019   Splenomegaly    Stroke [facility])    Ulcer 05/2024   Past Surgical History:  Procedure Laterality Date   COLON SURGERY     Cancer   COSMETIC SURGERY     Skin cancer   ESOPHAGEAL VARICE LIGATION      ESOPHAGOGASTRODUODENOSCOPY N/A 05/28/2024   Procedure: EGD (ESOPHAGOGASTRODUODENOSCOPY);  Surgeon: Albertus Gordy HERO, MD;  Location: [facility];  Service: Gastroenterology;  Laterality: N/A;   EYE SURGERY     For cataracts   HEMICOLECTOMY  01/08/2003   IR RADIOLOGIST EVAL & MGMT  12/20/2020   IR RADIOLOGIST EVAL & MGMT  05/29/2021   LIVER SURGERY     shunts placed after chemo complication   SKIN FULL THICKNESS GRAFT N/A 09/12/2019   Procedure: debridement and FTSG to the nose from left upper arm;  Surgeon: Elisabeth Craig RAMAN, MD;  Location: [facility];  Service: Plastics;  Laterality: N/A;  2 hours, please   TIPS PROCEDURE     Patient Active Problem List   Diagnosis Date Noted   AMS (altered mental status) 07/01/2024   Heme positive stool 05/28/2024   Acute gastric ulcer without hemorrhage or perforation 05/28/2024   Gastritis and gastroduodenitis 05/28/2024   Acute on chronic anemia 05/27/2024   SIRS (systemic inflammatory response syndrome) (HCC) 05/21/2024   Acute prerenal azotemia 05/21/2024   Paroxysmal atrial fibrillation (HCC) 05/21/2024   Acute metabolic encephalopathy 05/20/2024  Vision disturbance 05/11/2024   Patent foramen ovale 04/16/2024   Acute CVA (cerebrovascular accident) (HCC) 04/15/2024   Abnormal CBC 04/10/2024   Hyperkalemia 04/05/2024   Hypoglycemia 04/05/2024   Macrocytic anemia 04/05/2024   Cerebrovascular accident (CVA) (HCC) 03/16/2024   History of stroke 01/06/2024   Thrombocytopenia 12/21/2023   Decreased GFR 12/10/2023   Preventative health care 09/09/2023   Lower extremity edema 08/10/2023   Cellulitis of both lower extremities 08/04/2023   Altered mental status 08/04/2023   Bradycardia 05/07/2023   Protein-calorie malnutrition, severe 02/26/2023   Weight loss 02/24/2023   Other constipation 02/24/2023   Generalized abdominal pain 02/24/2023   Hospital discharge follow-up 02/24/2023   Weakness 01/04/2023   Confusion 08/26/2022    Frequent UTI 05/18/2022   Anemia of chronic disease 05/18/2022   AKI (acute kidney injury) 04/30/2022   Tachycardia-bradycardia syndrome (HCC) 02/05/2022   Hypomagnesemia 02/04/2022   Hepatic encephalopathy (HCC) 02/03/2022   Basal cell carcinoma (BCC) 05/06/2021   Hypertension 05/06/2021   Cirrhosis (HCC) 12/04/2020   Murmur, cardiac 03/19/2020   Acquired hypothyroidism 06/30/2019   History of basal cell cancer 06/30/2019   Hx of colon cancer, stage III 11/30/2011   Portal hypertension (HCC) 01/23/2008    ONSET DATE: 07-01-24 for most recent hospitalization  REFERRING DIAG:  Diagnosis  K76.82 (ICD-10-CM) - Hepatic encephalopathy (HCC)  R53.1 (ICD-10-CM) - Weakness    THERAPY DIAG:  Unsteadiness on feet  Other abnormalities of gait and mobility  Muscle weakness (generalized)  Other lack of coordination  Difficulty in walking, not elsewhere classified  Rationale for Evaluation and Treatment: Rehabilitation  SUBJECTIVE:                                                                                                                                                                                             SUBJECTIVE STATEMENT:  A week ago yesterday she started having an encephalopathic episode per husband.  They adjusted her supplements and medicines and it took a few days for her to come out of it.  She was never comatose per report, just mild confusion and fatigue.  They both endorse she is on the mend and saw her liver doctor as it was time for check up yesterday.  She has not returned to HEP since episode, but husband really wants her to get back to at least 50/50 w/ the cane vs w/o when walking.  No recent falls.  She had some episodes of imbalance with episode but husband supervised and stabilized her.  Pt accompanied by: spouse, [name]  PERTINENT HISTORY:  Per chart note 07-01-24; past medical history significant for cirrhosis,  prior hepatic encephalopathy, acquired  hypothyroidism, presented to hospital with altered mental status and elevated ammonia level for 5 days with generalized weakness.  hospitalized 07-01-24 - 07-03-24 with hepatic encephalopathy PMHx of  Stroke Hypothyroidism Cecal adenocarcinoma, s/p resection and chemotherapy in 2004 Liver disease, cirrhosis,  Skin Cancer  Hypertension  PAIN:   Are you having pain? Yes: NPRS scale: no - 0/10 Pain location: Rt lateral hip  Pain description: throbbing, sore Aggravating factors: prolonged standing/walking; have to be careful how I lay - able to lie on Rt side if heat is on it Relieving factors: [place] Hot & heat   PRECAUTIONS: Fall  RED FLAGS: None   WEIGHT BEARING RESTRICTIONS: No  FALLS: Has patient fallen in last 6 months? No  LIVING ENVIRONMENT: Lives with: lives with their spouse Lives in: House/apartment Stairs: No Has following equipment at home: Ramped entry; SPC, RW, shower chair, manual wheelchair, BSC  PLOF: Independent with basic ADLs, Independent with household mobility with device, Independent with transfers, and Needs assistance with homemaking  PATIENT GOALS: walk without my cane; increase energy   OBJECTIVE:  Note: Objective measures were completed at Evaluation unless otherwise noted.  DIAGNOSTIC FINDINGS: CT scan 06-19-24 IMPRESSION: 1. No evidence of acute intracranial abnormality. 2. Similar prior left PCA territory infarct and chronic microvascular ischemic change.  COGNITION: Overall cognitive status: Within functional limits for tasks assessed   SENSATION: Not tested  COORDINATION: WFL's bil. LE's    POSTURE: rounded shoulders, forward head, and increased thoracic kyphosis  LOWER EXTREMITY ROM:   WFL's bil. LE's   LOWER EXTREMITY MMT:    MMT Right Eval Left Eval  Hip flexion 4 4  Hip extension    Hip abduction    Hip adduction    Hip internal rotation    Hip external rotation    Knee flexion 4 4  Knee extension 5 5  Ankle  dorsiflexion 5 5  Ankle plantarflexion    Ankle inversion    Ankle eversion    (Blank rows = not tested)  BED MOBILITY:  Not tested =pt reports independent with bed mobility  TRANSFERS: Sit to stand: Modified independence  Assistive device utilized: bil. Armrests of chair     Stand to sit: Modified independence  Assistive device utilized: bil. Armrests of chair      RAMP:  Not tested  CURB:  Not tested  STAIRS: Not tested GAIT: Findings: Gait Characteristics: step through pattern, decreased step length- Right, and decreased step length- Left, Distance walked: 75', Assistive device utilized:Single point cane, Level of assistance: CGA, and Comments: SPC with small quad tip used during eval; pt reports she is beginning to ambulate very short distances in home without use of SPC; slow gait speed   FUNCTIONAL TESTS:  5 times sit to stand: 35.88 secs without UE support - from chair Timed up and go (TUG): 19.38 secs with SPC 10 meter walk test:  20.65 secs = 1.59 ft/sec  TREATMENT:  10/25/24  Gait:  Pt gait trained 230' with Hurrycane with CGA - cues to increase step length and maintain width of BOS > 115' x2 no AD CGA working on maintaining stride length and self-monitoring of fatigue, discussed efficiency related to step length and utilizing standing rest breaks vs seated when able to challenge endurance, good self correction noted with mild lateral sway and LOB throughout repeated bouts of gait.  Her knee begins to bother her at the end of final bout and pt requesting to work on seated activity.  TherEx: -Low > middle > high punches x20 each -3lb bicep curls 2x20 alt UE -3lb hammer curls 2x20 alt UE  PATIENT EDUCATION: Education details: Continue HEP.  Ways to safely work on walking w/o cane at home as pt improves.  Self-monitoring and verbalizing fatigue -  take note of standing vs seated rest breaks. Person educated: Patient and Spouse Education method: Explanation, Demonstration, and Handouts Education comprehension: verbalized understanding and needs further education  HOME EXERCISE PROGRAM: See above - [employer] [id number]  5-Minute Seated Cardio Blast - Silver Orthoptist Program (Try to do each combo continuously and take rest in between each set if needed.  You may alternate sides with each exercise to help manage fatigue with movement.)  Upper Body Combo: Sit tall in your chair and perform each move 4x. Shoulders lift up toward ears and then release down. Shoulders roll front and then back - you can use your elbows to progress movement as needed. Cross arms, then open arms. Arms reach front, then pull.  Lower Body Combo: Start this circuit with a stretch to loosen up your shoulders.  Roll your left shoulder back, then roll your right shoulder back and repeat on each side 3 times.   Perform these alternating heel taps 8x each. Tap your heel to the front, then back.  Swing the opposite arm forward with each tap. Tap your heel to the front, then back.  Extend opposite arm up toward the sky with each tap.  Side Tap Combo: Sit tall in your chair and alternate side-to-side toe taps 8x each. Alternate heel taps from side to side. Alternate heel taps from side to side and cross arms in front of you, then pull arms back.  Reach and Pull: Perform this move 16x (can do both or single arm at a time) Bring your elbows up, reach toward the front of the room, pull arms back and bring elbows down.  Single Leg Pump - Right Side: Perform this move 8x each. Pick up the speed and tap your right heel forward then back.  Pump your arms as you move.  Double Side Tap: Tap your right toe out to the right side twice.  Bring your right arm across your body to push toward the opposite side of the room with each tap.  Then, alternate the double toe  tap on each side 8x.  Double Arm Pull Down: Reach both arms up toward the sky, then pull arms down to sides.  As you pull down, squeeze your shoulder blades together and gently lift your heels from the floor.  Repeat this move 16x.  Single Leg Pump - Left Side: Perform these moves 8x. Pick up the speed and tap your left heel forward, then back.  Pump your arms as you move.  Fast Feet: Alternate tapping your toes to the ground in small, quick movements.  Repeat 16x.  Torso Twist: Repeat this move 16x. Twist your upper body  to the right, then to the left.  GOALS: Goals reviewed with patient? Yes  SHORT TERM GOALS: Target date: 09-08-24  Improve 5x sit to stand transfer score to </= 28 secs without UE support from chair to demo improved LE strength.  Baseline:  5 times sit to stand: 35.88 secs without UE support - from chair; 09-14-24: 23.31 secs without UE support Goal status: MET 09-14-24  2.  Improve TUG score to </= 16 secs with SPC to demonstrate reduced fall risk. Baseline: 19.38 secs with SPC;   09-14-24 - 21.97 secs   Goal status: Not met 09-14-24  3.  Increase gait velocity to >/= 1.9 ft/sec with use of SPC for increased gait efficiency. Baseline:  10 meter walk test:  20.65 secs = 1.59 ft/sec with SPC ;  16.57, 14.53 = 2.26 ft/sec with SPC Goal status: Goal met 09-14-24  4.  Ambulate 85' without use of SPC with supervision for household ambulation.  Baseline:  Goal status: Partially met 09-14-24  5.   Amb. 2 nonstop with use of SPC with SBA to CGA to demonstrate improved endurance/activity tolerance. Baseline: 2:03 - RPE 4-5/10 Goal status: MET 09-14-24  6.  Independent in HEP for balance and LE strengthening.  Baseline:  Goal status: Ongoing 09-14-24    LONG TERM GOALS: Target date: 10-06-24  Improve 5x sit to stand transfer score to </= 22 secs without UE support from chair to demo improved LE strength.  Baseline:  5 times sit to stand: 35.88 secs without  UE support - from chair;  21.84 secs Goal status:  Goal met 10-05-24  2.  Improve TUG score to </= 13.5 secs with SPC to demonstrate reduced fall risk. Baseline: 19.38 secs with SPC; 20.82 secs with SPC Goal status: Ongoing (goal <16 sec)  3.  Increase Berg balance test score by at least 5 points to demo improved balance and decreased fall risk.  Baseline: TBA;  42/56, 48/56 Goal status: Goal met 10-05-24  4.  Pt will report modified independent household ambulation without use of SPC. Baseline:  Goal status:  Ongoing (50% time amb. Without SPC)  5.  Increase gait velocity to >/= 2.4 ft/sec with use of SPC for increased gait efficiency. Baseline:  10 meter walk test:  20.65 secs = 1.59 ft/sec with SPC;  16.9 = 1.94 ft/sec  Goal status: Ongoing   6.  Amb. 4 nonstop with use of SPC with SBA for increased community accessibility.  Baseline:  Goal status:  Goal met per husband's report 10-05-24   UPDATED LONG TERM GOALS: Target date: 11-22-24   Improve 5x sit to stand transfer score to </= 18 secs without UE support from chair to demo improved LE strength.   Baseline:  5 times sit to stand: 35.88 secs without UE support - from chair;   Goal status: Upgraded   2.  Improve TUG score to </= 16 secs with SPC to demonstrate reduced fall risk.   Baseline: 19.38 secs with SPC; 20.82 secs with SPC    Goal status: Revised  3.  Increase Berg balance test score to >/= 50/56 to reduce fall risk.       Baseline: TBA;  42/56, 48/56 on 10-05-24      Goal status: Upgraded   4.  Pt will report modified independent household ambulation at least 50% time without use of SPC.       Baseline:        Goal status:  Ongoing   5.  Increase gait velocity to >/= 2.4 ft/sec with use of SPC for increased gait efficiency.       Baseline:  10 meter walk test:  20.65 secs = 1.59 ft/sec with SPC;  16.9 = 1.94 ft/sec        Goal status: Ongoing   6.  Amb. 6 nonstop with use of SPC with SBA for increased  community accessibility.  Baseline:  Goal status: Upgraded     ASSESSMENT:  CLINICAL IMPRESSION: Emphasis of skilled PT session today on gait training progressing to no AD as pt has had recent setback due to another episode of metabolic encephalopathy per husband report.  Pt is ambulating at slower pace this visit but demonstrates improved pattern and speed with increased distance.  She does fatigue quicker this visit but was able to tolerate seated aerobic style training at end of session.  She benefits from ongoing work to address endurance and gait without SPC. Cont with POC.   OBJECTIVE IMPAIRMENTS: decreased activity tolerance, decreased balance, decreased endurance, and difficulty walking.   ACTIVITY LIMITATIONS: carrying, lifting, bending, squatting, stairs, and locomotion level  PARTICIPATION LIMITATIONS: meal prep, cleaning, laundry, driving, shopping, and community activity  PERSONAL FACTORS: Behavior pattern, Past/current experiences, Time since onset of injury/illness/exacerbation, and 1-2 comorbidities: h/o hepatic encephalopathy with multiple hospitalizations and h/o CVA are also affecting patient's functional outcome.   REHAB POTENTIAL: Good  CLINICAL DECISION MAKING: Evolving/moderate complexity  EVALUATION COMPLEXITY: Moderate  PLAN:  PT FREQUENCY: 1x/week  PT DURATION: 5 weeks (5 additional sessions) per renewal   PLANNED INTERVENTIONS: 97110-Therapeutic exercises, 97530- Therapeutic activity, [id number]- Neuromuscular re-education, 97535- Self Care, [id number]- Gait training, and Patient/Family education  PLAN FOR NEXT SESSION: Cont balance and LE strengthening; gait training without use of SPC, dynamic gait activities   Daved [name], PT, DPT 10/25/2024, 5:05 PM

## 2024-11-01 ENCOUNTER — Inpatient Hospital Stay: Admission: RE | Admit: 2024-11-01 | Discharge: 2024-11-01 | Attending: Nurse Practitioner

## 2024-11-01 ENCOUNTER — Ambulatory Visit: Admitting: Nurse Practitioner

## 2024-11-01 VITALS — BP 102/58 | HR 62 | Temp 97.0°F | Ht 64.0 in | Wt 152.0 lb

## 2024-11-01 DIAGNOSIS — I480 Paroxysmal atrial fibrillation: Secondary | ICD-10-CM

## 2024-11-01 DIAGNOSIS — E039 Hypothyroidism, unspecified: Secondary | ICD-10-CM | POA: Diagnosis not present

## 2024-11-01 DIAGNOSIS — K7469 Other cirrhosis of liver: Secondary | ICD-10-CM

## 2024-11-01 DIAGNOSIS — Z8673 Personal history of transient ischemic attack (TIA), and cerebral infarction without residual deficits: Secondary | ICD-10-CM

## 2024-11-01 DIAGNOSIS — K7682 Hepatic encephalopathy: Secondary | ICD-10-CM

## 2024-11-01 DIAGNOSIS — Z8639 Personal history of other endocrine, nutritional and metabolic disease: Secondary | ICD-10-CM

## 2024-11-01 DIAGNOSIS — R7989 Other specified abnormal findings of blood chemistry: Secondary | ICD-10-CM | POA: Diagnosis not present

## 2024-11-01 DIAGNOSIS — K766 Portal hypertension: Secondary | ICD-10-CM

## 2024-11-01 DIAGNOSIS — R600 Localized edema: Secondary | ICD-10-CM

## 2024-11-01 DIAGNOSIS — I8510 Secondary esophageal varices without bleeding: Secondary | ICD-10-CM

## 2024-11-01 NOTE — Patient Instructions (Signed)
 Nice to see you today  I will be in touch with the labs once I have them  Follow up with me in 3 months for a physical

## 2024-11-01 NOTE — Progress Notes (Signed)
 Established Patient Office Visit  Subjective   Patient ID: [name], female    DOB: [birth year]  Age: 73 y.o. MRN: [id number]  Chief Complaint  Patient presents with   Follow-up    Pt complains of swelling in the past few days. Pt states she is doing fine. Bp has been okay. Today was 117/54.    Medication Refill    Ferrous Sulfate  325 mg     Patients husband provided majority of the history    Discussed the use of AI scribe software for clinical note transcription with the patient, who gave verbal consent to proceed.  History of Present Illness [name] is a 73 year old female with hepatic encephalopathy who presents for follow-up of her condition. She is accompanied by her husband.  She has been managing her hepatic encephalopathy with lactulose  and Xifaxan . Since her last visit, she experienced two episodes of near decompensation, managed by adjusting her lactulose  dosage and increasing fluid intake. She finds that three tablespoons of lactulose  three times a day, along with adequate water intake, helps maintain her condition. Her husband notes that lack of water intake exacerbates her symptoms, and he encourages her to drink water regularly.  She is currently taking ferrous sulfate , 65 mg of elemental iron per dose, twice daily since July. This regimen has not caused constipation, likely due to the concurrent use of lactulose . Her bowel movements range from one to four times a day, with occasional episodes of urgency without significant output, described as 'an ooze almost.' These episodes are more frequent when lactulose  dosage is increased.  Her recent blood pressure readings include 102/58 mmHg. No dizziness or lightheadedness. No fever, chills, chest pain, or shortness of breath.  She continues to take name-brand Synthroid  for her thyroid . She has a history of a CT scan showing stable findings, and a recent PET scan showed no hypermetabolic lesions. She has a  mammogram scheduled for November 08, 2024, and recently underwent an abdominal ultrasound. Her husband mentions that she has been hospitalized in the past, which inadvertently prevented her from needing surgery.     Review of Systems  Constitutional:  Negative for chills and fever.  Respiratory:  Negative for shortness of breath.   Cardiovascular:  Negative for chest pain.  Gastrointestinal:  Negative for abdominal pain and diarrhea.  Neurological:  Negative for dizziness and headaches.      Objective:     BP (!) 102/58   Pulse 62   Temp (!) 97 F (36.1 C) (Oral)   Ht 5' 4 (1.626 m)   Wt 152 lb (68.9 kg)   SpO2 98%   BMI 26.09 kg/m  BP Readings from Last 3 Encounters:  11/01/24 (!) 102/58  10/02/24 (!) 110/47  10/02/24 110/62   Wt Readings from Last 3 Encounters:  11/01/24 152 lb (68.9 kg)  10/02/24 153 lb 9.6 oz (69.7 kg)  09/02/24 155 lb 10.3 oz (70.6 kg)   SpO2 Readings from Last 3 Encounters:  11/01/24 98%  10/02/24 94%  09/02/24 96%      Physical Exam Vitals and nursing note reviewed.  Constitutional:      Appearance: Normal appearance.  Cardiovascular:     Rate and Rhythm: Normal rate and regular rhythm.     Heart sounds: Normal heart sounds.  Pulmonary:     Effort: Pulmonary effort is normal.     Breath sounds: Normal breath sounds.  Abdominal:     General: Bowel sounds are normal.  Neurological:     Mental Status: She is alert.      No results found for any visits on 11/01/24.    The ASCVD Risk score (Arnett DK, et al., 2019) failed to calculate for the following reasons:   Risk score cannot be calculated because patient has a medical history suggesting prior/existing ASCVD    Assessment & Plan:   Problem List Items Addressed This Visit       Cardiovascular and Mediastinum   Paroxysmal atrial fibrillation (HCC) - Primary   Relevant Orders   CBC   Comprehensive metabolic panel with GFR   TSH     Digestive   Cirrhosis (HCC)    Relevant Orders   CBC   Comprehensive metabolic panel with GFR     Endocrine   Acquired hypothyroidism   Relevant Orders   TSH     Other   History of stroke   Relevant Orders   Lipid panel   Other Visit Diagnoses       History of iron deficiency       Relevant Orders   CBC   IBC + Ferritin     Assessment and Plan Assessment & Plan Hepatic encephalopathy Managed with lactulose  and Xifaxan . Recent episodes of teetering without falls. Lactulose  regimen adjusted. Hydration emphasized. Xifaxan  cost concerns addressed with financial planning. - Continue lactulose  three tablespoons three times a day. - Ensure adequate hydration. - Continue Xifaxan  as prescribed. - Monitor for signs of encephalopathy and adjust lactulose  as needed.  Hypothyroidism Managed with Synthroid . Current regimen well-managed despite previous low thyroid  levels. - Continue Synthroid  75mcg as prescribed.  Iron deficiency anemia Managed with ferrous sulfate  65 mg twice daily. No constipation reported. - Continue ferrous sulfate  325 mg (65 mg) twice daily. -pending CBC and IBC+ ferritin    General Health Maintenance Routine health maintenance discussed. Blood work ordered for physical exam requirements. Mammogram scheduled. - Ordered blood work for physical exam. - Scheduled mammogram for December 17th, 2025.   Return in about 3 months (around 01/30/2025) for CPE.    [name], NP

## 2024-11-02 ENCOUNTER — Ambulatory Visit: Payer: Self-pay | Admitting: Physical Therapy

## 2024-11-02 LAB — COMPREHENSIVE METABOLIC PANEL WITH GFR
ALT: 51 U/L — ABNORMAL HIGH (ref 0–35)
AST: 78 U/L — ABNORMAL HIGH (ref 0–37)
Albumin: 3.1 g/dL — ABNORMAL LOW (ref 3.5–5.2)
Alkaline Phosphatase: 173 U/L — ABNORMAL HIGH (ref 39–117)
BUN: 26 mg/dL — ABNORMAL HIGH (ref 6–23)
CO2: 32 meq/L (ref 19–32)
Calcium: 10 mg/dL (ref 8.4–10.5)
Chloride: 104 meq/L (ref 96–112)
Creatinine, Ser: 1.54 mg/dL — ABNORMAL HIGH (ref 0.40–1.20)
GFR: 33.31 mL/min — ABNORMAL LOW (ref >60.00)
Glucose, Bld: 95 mg/dL (ref 70–99)
Potassium: 4.5 meq/L (ref 3.5–5.1)
Sodium: 142 meq/L (ref 135–145)
Total Bilirubin: 1.3 mg/dL — ABNORMAL HIGH (ref 0.2–1.2)
Total Protein: 5.5 g/dL — ABNORMAL LOW (ref 6.0–8.3)

## 2024-11-02 LAB — LIPID PANEL
Cholesterol: 143 mg/dL (ref 0–200)
HDL: 61.8 mg/dL (ref >39.00)
LDL Cholesterol: 72 mg/dL (ref 0–99)
NonHDL: 81.61
Total CHOL/HDL Ratio: 2
Triglycerides: 48 mg/dL (ref 0.0–149.0)
VLDL: 9.6 mg/dL (ref 0.0–40.0)

## 2024-11-02 LAB — IBC + FERRITIN
Ferritin: 224.1 ng/mL (ref 10.0–291.0)
Iron: 210 ug/dL — ABNORMAL HIGH (ref 42–145)
Saturation Ratios: 86.2 % — ABNORMAL HIGH (ref 20.0–50.0)
TIBC: 243.6 ug/dL — ABNORMAL LOW (ref 250.0–450.0)
Transferrin: 174 mg/dL — ABNORMAL LOW (ref 212.0–360.0)

## 2024-11-02 LAB — CBC
HCT: 31 % — ABNORMAL LOW (ref 36.0–46.0)
Hemoglobin: 10.4 g/dL — ABNORMAL LOW (ref 12.0–15.0)
MCHC: 33.6 g/dL (ref 30.0–36.0)
MCV: 103.2 fl — ABNORMAL HIGH (ref 78.0–100.0)
Platelets: 95 K/uL — ABNORMAL LOW (ref 150.0–400.0)
RBC: 3 Mil/uL — ABNORMAL LOW (ref 3.87–5.11)
RDW: 18.4 % — ABNORMAL HIGH (ref 11.5–15.5)
WBC: 2.4 K/uL — ABNORMAL LOW (ref 4.0–10.5)

## 2024-11-02 LAB — TSH: TSH: 0.15 u[IU]/mL — ABNORMAL LOW (ref 0.35–5.50)

## 2024-11-03 ENCOUNTER — Ambulatory Visit

## 2024-11-03 DIAGNOSIS — R7989 Other specified abnormal findings of blood chemistry: Secondary | ICD-10-CM | POA: Diagnosis not present

## 2024-11-03 LAB — T3, FREE: T3, Free: 2.7 pg/mL (ref 2.3–4.2)

## 2024-11-03 LAB — T4, FREE: Free T4: 1.44 ng/dL (ref 0.60–1.60)

## 2024-11-03 NOTE — Addendum Note (Signed)
 Addended by: ISADORA RAISIN on: 11/03/2024 08:31 AM   Modules accepted: Orders

## 2024-11-06 ENCOUNTER — Ambulatory Visit: Payer: Self-pay | Admitting: Nurse Practitioner

## 2024-11-06 DIAGNOSIS — E039 Hypothyroidism, unspecified: Secondary | ICD-10-CM

## 2024-11-06 MED ORDER — SYNTHROID 50 MCG PO TABS: 50.0000 ug | ORAL_TABLET | Freq: Every day | ORAL | 0 refills | Status: AC

## 2024-11-08 ENCOUNTER — Inpatient Hospital Stay: Admission: RE | Admit: 2024-11-08 | Discharge: 2024-11-08 | Attending: Nurse Practitioner

## 2024-11-08 DIAGNOSIS — Z1231 Encounter for screening mammogram for malignant neoplasm of breast: Secondary | ICD-10-CM

## 2024-11-09 ENCOUNTER — Ambulatory Visit: Payer: Self-pay | Admitting: Physical Therapy

## 2024-11-09 DIAGNOSIS — R2689 Other abnormalities of gait and mobility: Secondary | ICD-10-CM

## 2024-11-09 DIAGNOSIS — R2681 Unsteadiness on feet: Secondary | ICD-10-CM | POA: Diagnosis not present

## 2024-11-09 NOTE — Therapy (Unsigned)
 OUTPATIENT PHYSICAL THERAPY NEURO TREATMENT NOTE     Patient Name: [name] MRN: [id number] DOB:May 10, 1951, 73 y.o., female Today's Date: 11/09/2024   PCP: [name], NP REFERRING PROVIDER: [name], NP  END OF SESSION:            Past Medical History:  Diagnosis Date   Acute hepatic encephalopathy (HCC) 03/30/2024   Acute urinary retention 05/04/2022   Allergy 1952   Contrast dye   Arthritis    Knees and thumb   Blood transfusion without reported diagnosis    Cancer [facility])    cecum   Cataract    Surgery scheduled July 2023   Colon cancer (HCC) 2003   Elevated liver function tests    Esophageal varices (HCC)    Heart murmur    Hemorrhage of gastrointestinal tract 05/04/2011   Hepatic encephalopathy (HCC) 2025   Hypertension    Hypothyroidism    Iron deficiency anemia    Liver disease    chemotherapy complication, per pt, shunts placed to bypass liver   Malignant neoplasm of cecum (HCC)    Myocardial infarction [facility]) Jan 2025   Portal hypertension (HCC)    Skin cancer 2019   Splenomegaly    Stroke [facility])    Ulcer 05/2024   Past Surgical History:  Procedure Laterality Date   COLON SURGERY     Cancer   COSMETIC SURGERY     Skin cancer   ESOPHAGEAL VARICE LIGATION     ESOPHAGOGASTRODUODENOSCOPY N/A 05/28/2024   Procedure: EGD (ESOPHAGOGASTRODUODENOSCOPY);  Surgeon: Albertus Gordy HERO, MD;  Location: [facility];  Service: Gastroenterology;  Laterality: N/A;   EYE SURGERY     For cataracts   HEMICOLECTOMY  01/08/2003   IR RADIOLOGIST EVAL & MGMT  12/20/2020   IR RADIOLOGIST EVAL & MGMT  05/29/2021   LIVER SURGERY     shunts placed after chemo complication   SKIN FULL THICKNESS GRAFT N/A 09/12/2019   Procedure: debridement and FTSG to the nose from left upper arm;  Surgeon: Elisabeth Craig RAMAN, MD;  Location: [facility];  Service: Plastics;  Laterality: N/A;  2 hours, please   TIPS PROCEDURE     Patient Active Problem List    Diagnosis Date Noted   AMS (altered mental status) 07/01/2024   Heme positive stool 05/28/2024   Acute gastric ulcer without hemorrhage or perforation 05/28/2024   Gastritis and gastroduodenitis 05/28/2024   Acute on chronic anemia 05/27/2024   SIRS (systemic inflammatory response syndrome) (HCC) 05/21/2024   Acute prerenal azotemia 05/21/2024   Paroxysmal atrial fibrillation (HCC) 05/21/2024   Acute metabolic encephalopathy 05/20/2024   Vision disturbance 05/11/2024   Patent foramen ovale 04/16/2024   Acute CVA (cerebrovascular accident) (HCC) 04/15/2024   Abnormal CBC 04/10/2024   Hyperkalemia 04/05/2024   Hypoglycemia 04/05/2024   Macrocytic anemia 04/05/2024   Cerebrovascular accident (CVA) (HCC) 03/16/2024   History of stroke 01/06/2024   Thrombocytopenia 12/21/2023   Decreased GFR 12/10/2023   Preventative health care 09/09/2023   Lower extremity edema 08/10/2023   Cellulitis of both lower extremities 08/04/2023   Altered mental status 08/04/2023   Bradycardia 05/07/2023   Protein-calorie malnutrition, severe 02/26/2023   Weight loss 02/24/2023   Other constipation 02/24/2023   Generalized abdominal pain 02/24/2023   Hospital discharge follow-up 02/24/2023   Weakness 01/04/2023   Confusion 08/26/2022   Frequent UTI 05/18/2022   Anemia of chronic disease 05/18/2022   AKI (acute kidney injury) 04/30/2022   Tachycardia-bradycardia syndrome (  HCC) 02/05/2022   Hypomagnesemia 02/04/2022   Hepatic encephalopathy (HCC) 02/03/2022   Basal cell carcinoma (BCC) 05/06/2021   Hypertension 05/06/2021   Cirrhosis (HCC) 12/04/2020   Murmur, cardiac 03/19/2020   Acquired hypothyroidism 06/30/2019   History of basal cell cancer 06/30/2019   Hx of colon cancer, stage III 11/30/2011   Portal hypertension (HCC) 01/23/2008    ONSET DATE: 07-01-24 for most recent hospitalization  REFERRING DIAG:  Diagnosis  K76.82 (ICD-10-CM) - Hepatic encephalopathy (HCC)  R53.1 (ICD-10-CM) -  Weakness    THERAPY DIAG:  No diagnosis found.  Rationale for Evaluation and Treatment: Rehabilitation  SUBJECTIVE:                                                                                                                                                                                             SUBJECTIVE STATEMENT:  Husband reports pt came out of encephalopathic episode but wasn't her - it was thyroid  problem; adjusted thyroid  medication but will take 6-8 weeks to stabilize; hopefully on target with this adjustment and maybe more prepared than they were this time last year.  Pt now walking much slower than she was   Pt accompanied by: spouse, [name]  PERTINENT HISTORY:  Per chart note 07-01-24; past medical history significant for cirrhosis, prior hepatic encephalopathy, acquired hypothyroidism, presented to hospital with altered mental status and elevated ammonia level for 5 days with generalized weakness.  hospitalized 07-01-24 - 07-03-24 with hepatic encephalopathy PMHx of  Stroke Hypothyroidism Cecal adenocarcinoma, s/p resection and chemotherapy in 2004 Liver disease, cirrhosis,  Skin Cancer  Hypertension  PAIN:   Are you having pain? Yes: NPRS scale: no - 0/10 Pain location: Rt lateral hip  Pain description: throbbing, sore Aggravating factors: prolonged standing/walking; have to be careful how I lay - able to lie on Rt side if heat is on it Relieving factors: [place] & heat   PRECAUTIONS: Fall  RED FLAGS: None   WEIGHT BEARING RESTRICTIONS: No  FALLS: Has patient fallen in last 6 months? No  LIVING ENVIRONMENT: Lives with: lives with their spouse Lives in: House/apartment Stairs: No Has following equipment at home: Ramped entry; SPC, RW, shower chair, manual wheelchair, BSC  PLOF: Independent with basic ADLs, Independent with household mobility with device, Independent with transfers, and Needs assistance with homemaking  PATIENT GOALS: walk without my  cane; increase energy   OBJECTIVE:  Note: Objective measures were completed at Evaluation unless otherwise noted.  DIAGNOSTIC FINDINGS: CT scan 06-19-24 IMPRESSION: 1. No evidence of acute intracranial abnormality. 2. Similar prior left PCA territory infarct and chronic microvascular ischemic change.  COGNITION: Overall  cognitive status: Within functional limits for tasks assessed   SENSATION: Not tested  COORDINATION: WFL's bil. LE's    POSTURE: rounded shoulders, forward head, and increased thoracic kyphosis  LOWER EXTREMITY ROM:   WFL's bil. LE's   LOWER EXTREMITY MMT:    MMT Right Eval Left Eval  Hip flexion 4 4  Hip extension    Hip abduction    Hip adduction    Hip internal rotation    Hip external rotation    Knee flexion 4 4  Knee extension 5 5  Ankle dorsiflexion 5 5  Ankle plantarflexion    Ankle inversion    Ankle eversion    (Blank rows = not tested)  BED MOBILITY:  Not tested =pt reports independent with bed mobility  TRANSFERS: Sit to stand: Modified independence  Assistive device utilized: bil. Armrests of chair     Stand to sit: Modified independence  Assistive device utilized: bil. Armrests of chair      RAMP:  Not tested  CURB:  Not tested  STAIRS: Not tested GAIT: Findings: Gait Characteristics: step through pattern, decreased step length- Right, and decreased step length- Left, Distance walked: 75', Assistive device utilized:Single point cane, Level of assistance: CGA, and Comments: SPC with small quad tip used during eval; pt reports she is beginning to ambulate very short distances in home without use of SPC; slow gait speed   FUNCTIONAL TESTS:  5 times sit to stand: 35.88 secs without UE support - from chair Timed up and go (TUG): 19.38 secs with SPC 10 meter walk test:  20.65 secs = 1.59 ft/sec                                                                                                                                 TREATMENT:  11-09-24  Gait:  Pt gait trained 230' with Hurrycane with CGA - cues to increase step length and maintain width of BOS > 115' x2 no AD CGA working on maintaining stride length and self-monitoring of fatigue, discussed efficiency related to step length and utilizing standing rest breaks vs seated when able to challenge endurance, good self correction noted with mild lateral sway and LOB throughout repeated bouts of gait.  Her knee begins to bother her at the end of final bout and pt requesting to work on seated activity.  TherEx: -Low > middle > high punches x20 each -3lb bicep curls 2x20 alt UE -3lb hammer curls 2x20 alt UE  PATIENT EDUCATION: Education details: Continue HEP.  Ways to safely work on walking w/o cane at home as pt improves.  Self-monitoring and verbalizing fatigue - take note of standing vs seated rest breaks. Person educated: Patient and Spouse Education method: Explanation, Demonstration, and Handouts Education comprehension: verbalized understanding and needs further education  HOME EXERCISE PROGRAM: See above - [employer] [id number]  5-Minute Seated Cardio Blast - [employer] Program (Try to do each combo continuously and  take rest in between each set if needed.  You may alternate sides with each exercise to help manage fatigue with movement.)  Upper Body Combo: Sit tall in your chair and perform each move 4x. Shoulders lift up toward ears and then release down. Shoulders roll front and then back - you can use your elbows to progress movement as needed. Cross arms, then open arms. Arms reach front, then pull.  Lower Body Combo: Start this circuit with a stretch to loosen up your shoulders.  Roll your left shoulder back, then roll your right shoulder back and repeat on each side 3 times.   Perform these alternating heel taps 8x each. Tap your heel to the front, then back.  Swing the opposite arm forward with each tap. Tap your heel to the  front, then back.  Extend opposite arm up toward the sky with each tap.  Side Tap Combo: Sit tall in your chair and alternate side-to-side toe taps 8x each. Alternate heel taps from side to side. Alternate heel taps from side to side and cross arms in front of you, then pull arms back.  Reach and Pull: Perform this move 16x (can do both or single arm at a time) Bring your elbows up, reach toward the front of the room, pull arms back and bring elbows down.  Single Leg Pump - Right Side: Perform this move 8x each. Pick up the speed and tap your right heel forward then back.  Pump your arms as you move.  Double Side Tap: Tap your right toe out to the right side twice.  Bring your right arm across your body to push toward the opposite side of the room with each tap.  Then, alternate the double toe tap on each side 8x.  Double Arm Pull Down: Reach both arms up toward the sky, then pull arms down to sides.  As you pull down, squeeze your shoulder blades together and gently lift your heels from the floor.  Repeat this move 16x.  Single Leg Pump - Left Side: Perform these moves 8x. Pick up the speed and tap your left heel forward, then back.  Pump your arms as you move.  Fast Feet: Alternate tapping your toes to the ground in small, quick movements.  Repeat 16x.  Torso Twist: Repeat this move 16x. Twist your upper body to the right, then to the left.  GOALS: Goals reviewed with patient? Yes  SHORT TERM GOALS: Target date: 09-08-24  Improve 5x sit to stand transfer score to </= 28 secs without UE support from chair to demo improved LE strength.  Baseline:  5 times sit to stand: 35.88 secs without UE support - from chair; 09-14-24: 23.31 secs without UE support Goal status: MET 09-14-24  2.  Improve TUG score to </= 16 secs with SPC to demonstrate reduced fall risk. Baseline: 19.38 secs with SPC;   09-14-24 - 21.97 secs   Goal status: Not met 09-14-24  3.  Increase gait velocity to  >/= 1.9 ft/sec with use of SPC for increased gait efficiency. Baseline:  10 meter walk test:  20.65 secs = 1.59 ft/sec with SPC ;  16.57, 14.53 = 2.26 ft/sec with SPC Goal status: Goal met 09-14-24  4.  Ambulate 46' without use of SPC with supervision for household ambulation.  Baseline:  Goal status: Partially met 09-14-24  5.   Amb. 2 nonstop with use of SPC with SBA to CGA to demonstrate improved endurance/activity tolerance. Baseline: 2:03 - RPE  4-5/10 Goal status: MET 09-14-24  6.  Independent in HEP for balance and LE strengthening.  Baseline:  Goal status: Ongoing 09-14-24    LONG TERM GOALS: Target date: 10-06-24  Improve 5x sit to stand transfer score to </= 22 secs without UE support from chair to demo improved LE strength.  Baseline:  5 times sit to stand: 35.88 secs without UE support - from chair;  21.84 secs Goal status:  Goal met 10-05-24  2.  Improve TUG score to </= 13.5 secs with SPC to demonstrate reduced fall risk. Baseline: 19.38 secs with SPC; 20.82 secs with SPC Goal status: Ongoing (goal <16 sec)  3.  Increase Berg balance test score by at least 5 points to demo improved balance and decreased fall risk.  Baseline: TBA;  42/56, 48/56 Goal status: Goal met 10-05-24  4.  Pt will report modified independent household ambulation without use of SPC. Baseline:  Goal status:  Ongoing (50% time amb. Without SPC)  5.  Increase gait velocity to >/= 2.4 ft/sec with use of SPC for increased gait efficiency. Baseline:  10 meter walk test:  20.65 secs = 1.59 ft/sec with SPC;  16.9 = 1.94 ft/sec  Goal status: Ongoing   6.  Amb. 4 nonstop with use of SPC with SBA for increased community accessibility.  Baseline:  Goal status:  Goal met per husband's report 10-05-24   UPDATED LONG TERM GOALS: Target date: 11-22-24   Improve 5x sit to stand transfer score to </= 18 secs without UE support from chair to demo improved LE strength.   Baseline:  5 times sit to  stand: 35.88 secs without UE support - from chair;   Goal status: Upgraded   2.  Improve TUG score to </= 16 secs with SPC to demonstrate reduced fall risk.   Baseline: 19.38 secs with SPC; 20.82 secs with SPC    Goal status: Revised  3.  Increase Berg balance test score to >/= 50/56 to reduce fall risk.       Baseline: TBA;  42/56, 48/56 on 10-05-24      Goal status: Upgraded   4.  Pt will report modified independent household ambulation at least 50% time without use of SPC.       Baseline:        Goal status:  Ongoing   5.  Increase gait velocity to >/= 2.4 ft/sec with use of SPC for increased gait efficiency.       Baseline:  10 meter walk test:  20.65 secs = 1.59 ft/sec with SPC;  16.9 = 1.94 ft/sec        Goal status: Ongoing   6.  Amb. 6 nonstop with use of SPC with SBA for increased community accessibility.  Baseline:  Goal status: Upgraded     ASSESSMENT:  CLINICAL IMPRESSION: Emphasis of skilled PT session today on gait training progressing to no AD as pt has had recent setback due to another episode of metabolic encephalopathy per husband report.  Pt is ambulating at slower pace this visit but demonstrates improved pattern and speed with increased distance.  She does fatigue quicker this visit but was able to tolerate seated aerobic style training at end of session.  She benefits from ongoing work to address endurance and gait without SPC. Cont with POC.   OBJECTIVE IMPAIRMENTS: decreased activity tolerance, decreased balance, decreased endurance, and difficulty walking.   ACTIVITY LIMITATIONS: carrying, lifting, bending, squatting, stairs, and locomotion level  PARTICIPATION LIMITATIONS: meal prep,  cleaning, laundry, driving, shopping, and community activity  PERSONAL FACTORS: Behavior pattern, Past/current experiences, Time since onset of injury/illness/exacerbation, and 1-2 comorbidities: h/o hepatic encephalopathy with multiple hospitalizations and h/o CVA are also  affecting patient's functional outcome.   REHAB POTENTIAL: Good  CLINICAL DECISION MAKING: Evolving/moderate complexity  EVALUATION COMPLEXITY: Moderate  PLAN:  PT FREQUENCY: 1x/week  PT DURATION: 5 weeks (5 additional sessions) per renewal   PLANNED INTERVENTIONS: 97110-Therapeutic exercises, 97530- Therapeutic activity, [id number]- Neuromuscular re-education, [phone]- Self Care, [id number]- Gait training, and Patient/Family education  PLAN FOR NEXT SESSION: Cont balance and LE strengthening; gait training without use of SPC, dynamic gait activities   Lurlean Kernen, [place], PT 11/09/2024, 2:07 PM

## 2024-11-10 ENCOUNTER — Encounter: Payer: Self-pay | Admitting: Physical Therapy

## 2024-11-14 ENCOUNTER — Ambulatory Visit: Payer: Self-pay | Admitting: Physical Therapy

## 2024-11-14 ENCOUNTER — Ambulatory Visit: Payer: Self-pay | Admitting: Nurse Practitioner

## 2024-11-21 ENCOUNTER — Ambulatory Visit: Payer: Self-pay | Admitting: Physical Therapy

## 2024-11-28 ENCOUNTER — Emergency Department (HOSPITAL_COMMUNITY)

## 2024-11-28 ENCOUNTER — Inpatient Hospital Stay (HOSPITAL_COMMUNITY)
Admission: EM | Admit: 2024-11-28 | Discharge: 2024-12-07 | DRG: 442 | Disposition: A | Discharge status: Home / Self Care | Attending: Student | Admitting: Student

## 2024-11-28 ENCOUNTER — Encounter (HOSPITAL_COMMUNITY): Payer: Self-pay | Admitting: Emergency Medicine

## 2024-11-28 ENCOUNTER — Other Ambulatory Visit: Payer: Self-pay

## 2024-11-28 DIAGNOSIS — E860 Dehydration: Secondary | ICD-10-CM | POA: Diagnosis present

## 2024-11-28 DIAGNOSIS — E878 Other disorders of electrolyte and fluid balance, not elsewhere classified: Secondary | ICD-10-CM | POA: Diagnosis present

## 2024-11-28 DIAGNOSIS — D6959 Other secondary thrombocytopenia: Secondary | ICD-10-CM | POA: Diagnosis present

## 2024-11-28 DIAGNOSIS — Z79890 Hormone replacement therapy (postmenopausal): Secondary | ICD-10-CM

## 2024-11-28 DIAGNOSIS — E039 Hypothyroidism, unspecified: Secondary | ICD-10-CM | POA: Diagnosis present

## 2024-11-28 DIAGNOSIS — D689 Coagulation defect, unspecified: Secondary | ICD-10-CM | POA: Diagnosis not present

## 2024-11-28 DIAGNOSIS — Z91041 Radiographic dye allergy status: Secondary | ICD-10-CM

## 2024-11-28 DIAGNOSIS — R54 Age-related physical debility: Secondary | ICD-10-CM | POA: Diagnosis present

## 2024-11-28 DIAGNOSIS — E872 Acidosis, unspecified: Secondary | ICD-10-CM | POA: Diagnosis present

## 2024-11-28 DIAGNOSIS — K219 Gastro-esophageal reflux disease without esophagitis: Secondary | ICD-10-CM | POA: Diagnosis present

## 2024-11-28 DIAGNOSIS — K7469 Other cirrhosis of liver: Secondary | ICD-10-CM | POA: Diagnosis present

## 2024-11-28 DIAGNOSIS — D539 Nutritional anemia, unspecified: Secondary | ICD-10-CM | POA: Diagnosis present

## 2024-11-28 DIAGNOSIS — I252 Old myocardial infarction: Secondary | ICD-10-CM

## 2024-11-28 DIAGNOSIS — K7682 Hepatic encephalopathy: Secondary | ICD-10-CM | POA: Diagnosis present

## 2024-11-28 DIAGNOSIS — D61818 Other pancytopenia: Secondary | ICD-10-CM | POA: Diagnosis present

## 2024-11-28 DIAGNOSIS — E2740 Unspecified adrenocortical insufficiency: Secondary | ICD-10-CM | POA: Diagnosis present

## 2024-11-28 DIAGNOSIS — Z85038 Personal history of other malignant neoplasm of large intestine: Secondary | ICD-10-CM

## 2024-11-28 DIAGNOSIS — D72819 Decreased white blood cell count, unspecified: Secondary | ICD-10-CM | POA: Diagnosis not present

## 2024-11-28 DIAGNOSIS — Z800 Family history of malignant neoplasm of digestive organs: Secondary | ICD-10-CM

## 2024-11-28 DIAGNOSIS — R680 Hypothermia, not associated with low environmental temperature: Secondary | ICD-10-CM | POA: Diagnosis present

## 2024-11-28 DIAGNOSIS — R339 Retention of urine, unspecified: Secondary | ICD-10-CM | POA: Diagnosis present

## 2024-11-28 DIAGNOSIS — K5900 Constipation, unspecified: Secondary | ICD-10-CM | POA: Diagnosis present

## 2024-11-28 DIAGNOSIS — E875 Hyperkalemia: Secondary | ICD-10-CM | POA: Diagnosis present

## 2024-11-28 DIAGNOSIS — Z85828 Personal history of other malignant neoplasm of skin: Secondary | ICD-10-CM | POA: Diagnosis not present

## 2024-11-28 DIAGNOSIS — K766 Portal hypertension: Secondary | ICD-10-CM | POA: Diagnosis present

## 2024-11-28 DIAGNOSIS — Z79899 Other long term (current) drug therapy: Secondary | ICD-10-CM

## 2024-11-28 DIAGNOSIS — R531 Weakness: Secondary | ICD-10-CM

## 2024-11-28 DIAGNOSIS — D696 Thrombocytopenia, unspecified: Secondary | ICD-10-CM | POA: Diagnosis not present

## 2024-11-28 DIAGNOSIS — N179 Acute kidney failure, unspecified: Secondary | ICD-10-CM | POA: Diagnosis present

## 2024-11-28 DIAGNOSIS — D684 Acquired coagulation factor deficiency: Secondary | ICD-10-CM | POA: Diagnosis present

## 2024-11-28 DIAGNOSIS — Z833 Family history of diabetes mellitus: Secondary | ICD-10-CM

## 2024-11-28 DIAGNOSIS — K7581 Nonalcoholic steatohepatitis (NASH): Secondary | ICD-10-CM | POA: Diagnosis present

## 2024-11-28 DIAGNOSIS — Z7982 Long term (current) use of aspirin: Secondary | ICD-10-CM | POA: Diagnosis not present

## 2024-11-28 DIAGNOSIS — Z9049 Acquired absence of other specified parts of digestive tract: Secondary | ICD-10-CM

## 2024-11-28 DIAGNOSIS — I63449 Cerebral infarction due to embolism of unspecified cerebellar artery: Secondary | ICD-10-CM | POA: Diagnosis not present

## 2024-11-28 DIAGNOSIS — Z8673 Personal history of transient ischemic attack (TIA), and cerebral infarction without residual deficits: Secondary | ICD-10-CM

## 2024-11-28 DIAGNOSIS — I10 Essential (primary) hypertension: Secondary | ICD-10-CM | POA: Diagnosis present

## 2024-11-28 DIAGNOSIS — E162 Hypoglycemia, unspecified: Secondary | ICD-10-CM | POA: Diagnosis not present

## 2024-11-28 DIAGNOSIS — Z8249 Family history of ischemic heart disease and other diseases of the circulatory system: Secondary | ICD-10-CM | POA: Diagnosis not present

## 2024-11-28 DIAGNOSIS — Z822 Family history of deafness and hearing loss: Secondary | ICD-10-CM

## 2024-11-28 DIAGNOSIS — I959 Hypotension, unspecified: Secondary | ICD-10-CM | POA: Diagnosis present

## 2024-11-28 DIAGNOSIS — T451X5A Adverse effect of antineoplastic and immunosuppressive drugs, initial encounter: Secondary | ICD-10-CM | POA: Diagnosis present

## 2024-11-28 DIAGNOSIS — R739 Hyperglycemia, unspecified: Secondary | ICD-10-CM | POA: Diagnosis not present

## 2024-11-28 DIAGNOSIS — Z8261 Family history of arthritis: Secondary | ICD-10-CM

## 2024-11-28 LAB — CBC WITH DIFFERENTIAL/PLATELET
Abs Immature Granulocytes: 0.03 K/uL (ref 0.00–0.07)
Basophils Absolute: 0 K/uL (ref 0.0–0.1)
Basophils Relative: 0 %
Eosinophils Absolute: 0.1 K/uL (ref 0.0–0.5)
Eosinophils Relative: 2 %
HCT: 32.8 % — ABNORMAL LOW (ref 36.0–46.0)
Hemoglobin: 10.8 g/dL — ABNORMAL LOW (ref 12.0–15.0)
Immature Granulocytes: 1 %
Lymphocytes Relative: 15 %
Lymphs Abs: 0.7 K/uL (ref 0.7–4.0)
MCH: 34.5 pg — ABNORMAL HIGH (ref 26.0–34.0)
MCHC: 32.9 g/dL (ref 30.0–36.0)
MCV: 104.8 fL — ABNORMAL HIGH (ref 80.0–100.0)
Monocytes Absolute: 0.4 K/uL (ref 0.1–1.0)
Monocytes Relative: 8 %
Neutro Abs: 3.7 K/uL (ref 1.7–7.7)
Neutrophils Relative %: 74 %
Platelets: 120 K/uL — ABNORMAL LOW (ref 150–400)
RBC: 3.13 MIL/uL — ABNORMAL LOW (ref 3.87–5.11)
RDW: 18.7 % — ABNORMAL HIGH (ref 11.5–15.5)
WBC: 5 K/uL (ref 4.0–10.5)
nRBC: 0 % (ref 0.0–0.2)

## 2024-11-28 LAB — COMPREHENSIVE METABOLIC PANEL WITH GFR
ALT: 49 U/L — ABNORMAL HIGH (ref 0–44)
AST: 79 U/L — ABNORMAL HIGH (ref 15–41)
Albumin: 3.3 g/dL — ABNORMAL LOW (ref 3.5–5.0)
Alkaline Phosphatase: 228 U/L — ABNORMAL HIGH (ref 38–126)
Anion gap: 12 (ref 5–15)
BUN: 32 mg/dL — ABNORMAL HIGH (ref 8–23)
CO2: 25 mmol/L (ref 22–32)
Calcium: 10 mg/dL (ref 8.9–10.3)
Chloride: 102 mmol/L (ref 98–111)
Creatinine, Ser: 1.53 mg/dL — ABNORMAL HIGH (ref 0.44–1.00)
GFR, Estimated: 36 mL/min — ABNORMAL LOW
Glucose, Bld: 84 mg/dL (ref 70–99)
Potassium: 5.3 mmol/L — ABNORMAL HIGH (ref 3.5–5.1)
Sodium: 139 mmol/L (ref 135–145)
Total Bilirubin: 1.8 mg/dL — ABNORMAL HIGH (ref 0.0–1.2)
Total Protein: 6.6 g/dL (ref 6.5–8.1)

## 2024-11-28 LAB — AMMONIA: Ammonia: 222 umol/L — ABNORMAL HIGH (ref 9–35)

## 2024-11-28 LAB — TSH: TSH: 1.33 u[IU]/mL (ref 0.350–4.500)

## 2024-11-28 MED ORDER — ORAL CARE MOUTH RINSE: 15.0000 mL | OROMUCOSAL | Status: DC | PRN

## 2024-11-28 MED ORDER — BISACODYL 5 MG PO TBEC: 5.0000 mg | DELAYED_RELEASE_TABLET | Freq: Every day | ORAL | Status: DC | PRN

## 2024-11-28 MED ORDER — ACETAMINOPHEN 325 MG PO TABS
650.0000 mg | ORAL_TABLET | Freq: Four times a day (QID) | ORAL | Status: DC | PRN
  Filled 2024-11-28: qty 2

## 2024-11-28 MED ORDER — SENNOSIDES-DOCUSATE SODIUM 8.6-50 MG PO TABS: 1.0000 | ORAL_TABLET | Freq: Every evening | ORAL | Status: DC | PRN

## 2024-11-28 MED ORDER — ONDANSETRON HCL 4 MG PO TABS: 4.0000 mg | ORAL_TABLET | Freq: Four times a day (QID) | ORAL | Status: DC | PRN

## 2024-11-28 MED ORDER — SODIUM CHLORIDE 0.9 % IV SOLN: INTRAVENOUS | Status: AC

## 2024-11-28 MED ORDER — ONDANSETRON HCL 4 MG/2ML IJ SOLN: 4.0000 mg | Freq: Four times a day (QID) | INTRAMUSCULAR | Status: DC | PRN

## 2024-11-28 MED ORDER — SODIUM CHLORIDE 0.9 % IV BOLUS
1000.0000 mL | Freq: Once | INTRAVENOUS | Status: AC
  Administered 2024-11-28: 1000 mL via INTRAVENOUS

## 2024-11-28 MED ORDER — HEPARIN SODIUM (PORCINE) 5000 UNIT/ML IJ SOLN
5000.0000 [IU] | Freq: Three times a day (TID) | INTRAMUSCULAR | Status: DC
  Administered 2024-11-28 – 2024-12-07 (×24): 5000 [IU] via SUBCUTANEOUS
  Filled 2024-11-28 (×25): qty 1

## 2024-11-28 MED ORDER — LACTULOSE ENEMA
300.0000 mL | Freq: Two times a day (BID) | ORAL | Status: DC
  Administered 2024-11-28 – 2024-11-29 (×3): 300 mL via RECTAL
  Filled 2024-11-28 (×6): qty 300

## 2024-11-28 MED ORDER — LACTULOSE ENEMA
300.0000 mL | ORAL | Status: AC
  Administered 2024-11-28: 300 mL via RECTAL
  Filled 2024-11-28: qty 300

## 2024-11-28 MED ORDER — ACETAMINOPHEN 650 MG RE SUPP: 650.0000 mg | Freq: Four times a day (QID) | RECTAL | Status: DC | PRN

## 2024-11-28 NOTE — ED Notes (Signed)
 No fecal matter out with Lactulose  Enema

## 2024-11-28 NOTE — ED Provider Triage Note (Signed)
 Emergency Medicine Provider Triage Evaluation Note  [name] , a 74 y.o. female  was evaluated in triage.  Pt complains of altered mental status.  Patient with past history significant for hepatic encephalopathy presents with concerns of altered mental status.  Unclear timeline of when she became altered.  No family at bedside.  EMS reports GCS of 12.  She reportedly has a history of recurrent hepatic encephalopathy and receives lactulose  4 times daily.  She is unable to provide me with any history.  Review of Systems  Positive: As above Negative: As above  Physical Exam  BP (!) 125/54 (BP Location: Left Arm)   Pulse 72   Temp 97.6 F (36.4 C) (Axillary)   Resp 16   Ht 5' 4 (1.626 m)   Wt 68 kg   SpO2 100%   BMI 25.75 kg/m  Gen:   Awake, no distress   Resp:  Normal effort  MSK:   Moves extremities without difficulty  Other:  No appreciable abdominal tenderness or rigidity.  Medical Decision Making  Medically screening exam initiated at 2:14 PM.  Appropriate orders placed.  [name] was informed that the remainder of the evaluation will be completed by another provider, this initial triage assessment does not replace that evaluation, and the importance of remaining in the ED until their evaluation is complete.     Dahmir Epperly A, PA-C 11/28/24 1415

## 2024-11-28 NOTE — H&P (Signed)
 " History and Physical  [name] FMW:[id number] DOB: 1951-10-18 DOA: 11/28/2024  PCP: [name]   Chief Complaint: Altered mental status  HPI: [name] is a 74 y.o. female with medical history significant for cirrhosis 2/2 chemotherapy s/p TIPS c/b hepatic encephalopathy, colon cancer s/p hemicolectomy, CVA, IDA, hypothyroidism, HTN and gastritis who presented to the ED for evaluation of altered mental status. Per spouse, patient prescribed 3 teaspoons of lactulose  4 times a day however he has been doing 3 times a day with good 2-3 bowel movements a day. Synthroid  dose was reduced from 75 to 50 mcg 3 weeks ago.  Over the last week, he has noticed patient has had decreased bowel movement on her current lactulose  dose. Over the last few days, she has had mild tremor and has become more sleepy but has been able to have conversations as usual. This morning, he found patient in a significant amount of stool and altered more than her baseline so he presented her to the ED for further evaluation.  Per spouse, patient has not had any recent illnesses, fevers, cough or shortness of breath. At bedside, patient somnolent, occasionally opens eyes and moans to noxious stimuli but does not follow any commands or answer any questions.   ED Course: Initial vitals show patient afebrile and normotensive. Initial labs significant for K+ 5.3, BUNs/creatinine 32/1.53, AST/ALT 79/49, alk phos 228, ammonia 222, WBC 5.0, Hgb 10.8, platelet 120, glucose 84. CXR shows cardiomegaly but no active disease.  CT head with no acute intracranial abnormality.  Pt received IV NS 1 L bolus and lactulose  enema. TRH was consulted for admission.   Review of Systems: Please see HPI for pertinent positives and negatives. A complete 10 system review of systems are otherwise negative.  Past Medical History:  Diagnosis Date   Acute hepatic encephalopathy (HCC) 03/30/2024   Acute urinary retention 05/04/2022   Allergy 1952    Contrast dye   Arthritis    Knees and thumb   Blood transfusion without reported diagnosis    Cancer [facility])    cecum   Cataract    Surgery scheduled July 2023   Colon cancer (HCC) 2003   Elevated liver function tests    Esophageal varices (HCC)    Heart murmur    Hemorrhage of gastrointestinal tract 05/04/2011   Hepatic encephalopathy (HCC) 2025   Hypertension    Hypothyroidism    Iron deficiency anemia    Liver disease    chemotherapy complication, per pt, shunts placed to bypass liver   Malignant neoplasm of cecum (HCC)    Myocardial infarction [facility]) Jan 2025   Portal hypertension (HCC)    Skin cancer 2019   Splenomegaly    Stroke [facility])    Ulcer 05/2024   Past Surgical History:  Procedure Laterality Date   COLON SURGERY     Cancer   COSMETIC SURGERY     Skin cancer   ESOPHAGEAL VARICE LIGATION     ESOPHAGOGASTRODUODENOSCOPY N/A 05/28/2024   Procedure: EGD (ESOPHAGOGASTRODUODENOSCOPY);  Surgeon: [name] HERO, Park;  Location: [facility];  Service: Gastroenterology;  Laterality: N/A;   EYE SURGERY     For cataracts   HEMICOLECTOMY  01/08/2003   IR RADIOLOGIST EVAL & MGMT  12/20/2020   IR RADIOLOGIST EVAL & MGMT  05/29/2021   LIVER SURGERY     shunts placed after chemo complication   SKIN FULL THICKNESS GRAFT N/A 09/12/2019   Procedure: debridement and FTSG to the nose  from left upper arm;  Surgeon: [name] RAMAN, Park;  Location: [facility];  Service: Plastics;  Laterality: N/A;  2 hours, please   TIPS PROCEDURE     Social History:  reports that she has never smoked. She has never used smokeless tobacco. She reports that she does not drink alcohol  and does not use drugs.  Allergies[1]  Family History  Problem Relation Age of Onset   Arthritis Mother    Hearing loss Mother    Heart disease Mother    Hypertension Mother    Miscarriages / Stillbirths Mother    Arthritis Father    Diabetes Father    Heart disease Father    Cancer Maternal Aunt     Heart disease Maternal Grandfather    Breast cancer Neg Hx    Colon cancer Neg Hx    Esophageal cancer Neg Hx    Pancreatic cancer Neg Hx    Stomach cancer Neg Hx    Stroke Neg Hx      Prior to Admission medications  Medication Sig Start Date End Date Taking? Authorizing Provider  ACCU-CHEK GUIDE TEST test strip 4 (four) times daily. 04/05/24   Provider, Historical, Park  Accu-Chek Softclix Lancets lancets 3 (three) times daily. 04/05/24   Provider, Historical, Park  aspirin  EC 81 MG tablet Take 1 tablet (81 mg total) by mouth daily. Swallow whole. 12/26/23   [name] NOVAK, Park  Blood Glucose Monitoring Suppl DEVI 1 each by Does not apply route in the morning, at noon, and at bedtime. May substitute to any manufacturer covered by patient's insurance. 04/05/24   Katherine Nest, DO  CRANBERRY PO Take 1 tablet by mouth 2 (two) times daily.    Provider, Historical, Park  ENULOSE  10 GM/15ML SOLN TAKE 45 MLS BY MOUTH FOUR TIMES DAILY 07/21/24   Provider, Historical, Park  ferrous sulfate  325 (65 FE) MG tablet Take 1 tablet (325 mg total) by mouth 2 (two) times daily with a meal. 05/28/24 11/28/24  [name]  furosemide  (LASIX ) 20 MG tablet Take 20-40 mg by mouth See admin instructions. Take 40 mg by mouth every morning. Decrease the dose to 20 mg if the blood pressure is low.    Provider, Historical, Park  gabapentin  (NEURONTIN ) 100 MG capsule Take 1 capsule (100 mg total) by mouth 2 (two) times daily. 09/05/24   [name]  hydrocortisone  (ANUSOL -HC) 2.5 % rectal cream Place 1 Application rectally 2 (two) times daily. 06/30/24   [name]  lactulose  (CHRONULAC ) 10 GM/15ML solution Take 45 mLs (30 g total) by mouth 4 (four) times daily. 06/10/24   [name]  levOCARNitine  (CARNITOR ) 330 MG tablet Take 330 mg by mouth See admin instructions. Take 330 mg by mouth with breakfast, between 3-5 PM, and at bedtime 05/23/22   Provider, Historical, Park  liver oil-zinc  oxide (DESITIN) 40 %  ointment Apply topically daily as needed for irritation. 03/13/23   Pokhrel, Vernal, Park  magnesium  oxide (MAG-OX) 400 MG tablet Take 1 tablet (400 mg total) by mouth daily. 04/05/24   Katherine Nest, DO  Multiple Vitamin (MULTI-VITAMIN) tablet Take 1 tablet by mouth in the morning.    Provider, Historical, Park  pantoprazole  (PROTONIX ) 40 MG tablet Take 1 tablet (40 mg total) by mouth daily. 08/02/24 11/28/24  Abran Norleen SAILOR, Park  Phenylephrine  HCl (PREPARATION H RE) Place 1 Dose rectally as needed (hemorrhoids).    Provider, Historical, Park  polyethylene glycol (MIRALAX  /  GLYCOLAX ) 17 g packet Take 17 g by mouth daily. 06/10/24   [name]  potassium chloride  SA (KLOR-CON  M) 20 MEQ tablet Take 1 tablet (20 mEq total) by mouth daily. 06/20/24   Darci Pore, Park  Probiotic Product (PROBIOTIC PEARLS WOMENS) CAPS Take 1 capsule by mouth in the morning.    Provider, Historical, Park  PROTEIN PO Take 1 Bottle by mouth See admin instructions. Fair Life Nutrition Protein Shake- Drink 1 bottle by mouth daily if she can stomach it.    Provider, Historical, Park  rifaximin  (XIFAXAN ) 550 MG TABS tablet Take 1 tablet (550 mg total) by mouth 2 (two) times daily. 12/25/23   [name] NOVAK, Park  spironolactone  (ALDACTONE ) 50 MG tablet Take 1 tablet (50 mg total) by mouth daily. 06/21/24   Darci Pore, Park  SYNTHROID  50 MCG tablet Take 1 tablet (50 mcg total) by mouth daily before breakfast. 11/06/24   [name]  valACYclovir  (VALTREX ) 1000 MG tablet Take 1 tablet (1,000 mg total) by mouth 3 (three) times daily. 09/02/24   Juleen Rush, PA-C    Physical Exam: BP 111/61 (BP Location: Right Arm)   Pulse 67   Temp 97.6 F (36.4 C) (Axillary)   Resp 20   Ht 5' 4 (1.626 m)   Wt 64.3 kg   SpO2 99%   BMI 24.33 kg/m  General: Somnolent and weak appearing elderly woman laying in bed. No acute distress. HEENT: [place]/AT. Anicteric sclera CV: RRR. No murmurs, rubs, or gallops. Pulmonary: Anterior  lung sounds clear to auscultation.  Normal effort. Abdominal: Soft, NT/ND. Normal bowel sounds. Extremities: Palpable radial and DP pulses. Chronic lower extremity edema. Skin: Warm and dry. No obvious rash or lesions. Neuro: Somnolent and disoriented. Moans to noxious stimuli. Does not follow any commands. Moves all extremities.          Labs on Admission:  Basic Metabolic Panel: Recent Labs  Lab 11/28/24 1442  NA 139  K 5.3*  CL 102  CO2 25  GLUCOSE 84  BUN 32*  CREATININE 1.53*  CALCIUM  10.0   Liver Function Tests: Recent Labs  Lab 11/28/24 1442  AST 79*  ALT 49*  ALKPHOS 228*  BILITOT 1.8*  PROT 6.6  ALBUMIN  3.3*   No results for input(s): LIPASE, AMYLASE in the last 168 hours. Recent Labs  Lab 11/28/24 1442  AMMONIA 222*   CBC: Recent Labs  Lab 11/28/24 1442  WBC 5.0  NEUTROABS 3.7  HGB 10.8*  HCT 32.8*  MCV 104.8*  PLT 120*   Cardiac Enzymes: No results for input(s): CKTOTAL, CKMB, CKMBINDEX, TROPONINI in the last 168 hours. BNP (last 3 results) No results for input(s): BNP in the last 8760 hours.  ProBNP (last 3 results) No results for input(s): PROBNP in the last 8760 hours.  CBG: No results for input(s): GLUCAP in the last 168 hours.  Radiological Exams on Admission: DG Chest Portable 1 View Result Date: 11/28/2024 CLINICAL DATA:  Altered mental status EXAM: PORTABLE CHEST 1 VIEW COMPARISON:  07/01/2024 FINDINGS: Hypoventilatory changes. Cardiomegaly. No focal opacity, pleural effusion or pneumothorax. Minimal atelectasis or scar in the left lower lung. Aortic atherosclerosis. Tips in the right upper quadrant. IMPRESSION: No active disease. Cardiomegaly. Electronically Signed   By: Luke Bun M.D.   On: 11/28/2024 15:44   CT Head Wo Contrast Result Date: 11/28/2024 CLINICAL DATA:  Mental status change encephalopathy EXAM: CT HEAD WITHOUT CONTRAST TECHNIQUE: Contiguous axial images were obtained from the base of  the skull  through the vertex without intravenous contrast. RADIATION DOSE REDUCTION: This exam was performed according to the departmental dose-optimization program which includes automated exposure control, adjustment of the mA and/or kV according to patient size and/or use of iterative reconstruction technique. COMPARISON:  CT 06/19/2024, MRI 05/24/2024 FINDINGS: Brain: No acute territorial infarction, hemorrhage or intracranial mass. Small chronic left occipital and cerebellar infarcts. Chronic lacunar infarct right thalamus. Scattered white matter hypodensity consistent with chronic small vessel disease. No ventricular enlargement. Vascular: No hyperdense vessels.  Carotid vascular calcification Skull: No fracture Sinuses/Orbits: No acute finding. Other: None IMPRESSION: 1. No CT evidence for acute intracranial abnormality. 2. Chronic small vessel disease of the white matter. Chronic left PCA infarct Electronically Signed   By: Luke Bun M.D.   On: 11/28/2024 15:43   Assessment/Plan [name] is a 74 y.o. female with medical history significant for cirrhosis 2/2 chemotherapy s/p TIPS c/b hepatic encephalopathy, colon cancer s/p hemicolectomy, CVA, IDA, hypothyroidism, HTN and gastritis who presented to the ED for evaluation of altered mental status and admitted for acute hepatic encephalopathy  # Hepatic encephalopathy - Patient with history of cirrhosis and hepatic encephalopathy presenting with altered mental status and recent decreased bowel movement - Patient remains altered on evaluation with ammonia significantly elevated to 222 - Continue lactulose  enema 300 mL twice daily, w/ goal of 3-4 BM/day, uptitrate as needed - Will consider NG tube for lactulose  if no stool output with lactulose  enemas - Delirium precautions  # Hypothyroidism - Synthroid  dose reduced from 75 mcg to 50 mcg in mid December per spouse - Repeat TSH improved to 1.33 - Resume Synthroid  when able to take p.o.  appropriately  # AKI - Creatinine elevated to 1.53 likely from mild dehydration - Start IV NS at 100 cc/h for 1 day - Trend renal function and avoid nephrotoxic agents  # Hyperkalemia - Patient with mildly elevated potassium to 5.3 - Spouse reports he has been giving patient the potassium carbonate drink to avoid hypokalemia from her loose stools - Follow-up repeat K+ in the morning  # Cirrhosis - Thought to be secondary to chemotherapy status post TIPS with history of esophageal varices and portal hypertension - Trend LFTs and follow-up INR  # Thrombocytopenia - Secondary to cirrhosis, platelet improved to 120 - Trend CBC  # Chronic medical problems # CVA # HTN # Gastritis - Resume meds after improvement in mental status  DVT prophylaxis: Heparin     Code Status: Full Code  Consults called: None  Family Communication: Discussed results/findings and plan for admission with spouse at bedside  Severity of Illness: The appropriate patient status for this patient is INPATIENT. Inpatient status is judged to be reasonable and necessary in order to provide the required intensity of service to ensure the patient's safety. The patient's presenting symptoms, physical exam findings, and initial radiographic and laboratory data in the context of their chronic comorbidities is felt to place them at high risk for further clinical deterioration. Furthermore, it is not anticipated that the patient will be medically stable for discharge from the hospital within 2 midnights of admission.   * I certify that at the point of admission it is my clinical judgment that the patient will require inpatient hospital care spanning beyond 2 midnights from the point of admission due to high intensity of service, high risk for further deterioration and high frequency of surveillance required.*  Level of care: Progressive    Lou Claretta HERO, Park 11/28/2024, 7:52 PM  [facility] Pager:  [phone] Isaiah [phone]   If 7PM-7AM, please contact night-coverage [email or web address] Password [email or web address]    [1]  Allergies Allergen Reactions   Contrast Media [Iodinated Contrast Media] Hives   "

## 2024-11-28 NOTE — ED Provider Notes (Signed)
 " [place] EMERGENCY DEPARTMENT AT [facility] Provider Note   CSN: [id number] Arrival date & time: 11/28/24  1345     Patient presents with: Altered Mental Status   [name] is a 74 y.o. female.   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  Husband reports that she has been more sleepy over the past 3 to 4 days.  Has been uptitrating her lactulose  to 2-3 bowel movements per day but today started to become less responsive.  Found to have a large bowel movement today and he says that she usually has improved mental status afterwards but has remained confused.  No recent illnesses.  Several weeks ago decreased her levothyroxine  and has been more constipated from that       Prior to Admission medications  Medication Sig Start Date End Date Taking? Authorizing Provider  ACCU-CHEK GUIDE TEST test strip 4 (four) times daily. 04/05/24   [provider]  Accu-Chek Softclix Lancets lancets 3 (three) times daily. 04/05/24   [provider]  aspirin  EC 81 MG tablet Take 1 tablet (81 mg total) by mouth daily. Swallow whole. 12/26/23   Bryn Bernardino NOVAK, MD  Blood Glucose Monitoring Suppl DEVI 1 each by Does not apply route in the morning, at noon, and at bedtime. May substitute to any manufacturer covered by patient's insurance. 04/05/24   Rojelio Nest, DO  CRANBERRY PO Take 1 tablet by mouth 2 (two) times daily.    [provider]  ENULOSE  10 GM/15ML SOLN TAKE 45 MLS BY MOUTH FOUR TIMES DAILY 07/21/24   [provider]  ferrous sulfate  325 (65 FE) MG tablet Take 1 tablet (325 mg total) by mouth 2 (two) times daily with a meal. 05/28/24 11/28/24  Shahmehdi, Adriana LABOR, MD  furosemide  (LASIX ) 20 MG tablet Take 20-40 mg by mouth See admin instructions. Take 40 mg by mouth every morning. Decrease the dose to 20 mg if the blood pressure is low.     [provider]  gabapentin  (NEURONTIN ) 100 MG capsule Take 1 capsule (100 mg total) by mouth 2 (two) times daily. 09/05/24   [name], NP  hydrocortisone  (ANUSOL -HC) 2.5 % rectal cream Place 1 Application rectally 2 (two) times daily. 06/30/24   [name], NP  lactulose  (CHRONULAC ) 10 GM/15ML solution Take 45 mLs (30 g total) by mouth 4 (four) times daily. 06/10/24   Rizwan, Saima, MD  levOCARNitine  (CARNITOR ) 330 MG tablet Take 330 mg by mouth See admin instructions. Take 330 mg by mouth with breakfast, between 3-5 PM, and at bedtime 05/23/22   [provider]  liver oil-zinc  oxide (DESITIN) 40 % ointment Apply topically daily as needed for irritation. 03/13/23   Pokhrel, Vernal, MD  magnesium  oxide (MAG-OX) 400 MG tablet Take 1 tablet (400 mg total) by mouth daily. 04/05/24   Rojelio Nest, DO  Multiple Vitamin (MULTI-VITAMIN) tablet Take 1 tablet by mouth in the morning.    [provider]  pantoprazole  (PROTONIX ) 40 MG tablet Take 1 tablet (40 mg total) by mouth daily. 08/02/24 11/28/24  Abran Norleen SAILOR, MD  Phenylephrine  HCl (PREPARATION H RE) Place 1 Dose rectally as needed (hemorrhoids).    [provider]  polyethylene glycol (MIRALAX  / GLYCOLAX ) 17 g packet Take 17 g by mouth daily. 06/10/24   Rizwan, Saima, MD  potassium chloride  SA (KLOR-CON  M) 20 MEQ tablet Take 1 tablet (20  mEq total) by mouth daily. 06/20/24   Darci Pore, MD  Probiotic Product (PROBIOTIC PEARLS WOMENS) CAPS Take 1 capsule by mouth in the morning.    [provider]  PROTEIN PO Take 1 Bottle by mouth See admin instructions. Fair Life Nutrition Protein Shake- Drink 1 bottle by mouth daily if she can stomach it.    [provider]  rifaximin  (XIFAXAN ) 550 MG TABS tablet Take 1 tablet (550 mg total) by mouth 2 (two) times daily. 12/25/23   Bryn Bernardino NOVAK, MD  spironolactone  (ALDACTONE ) 50 MG tablet Take 1 tablet (50 mg total) by mouth daily. 06/21/24   Darci Pore, MD  SYNTHROID  50 MCG tablet Take 1 tablet (50 mcg total) by mouth daily before breakfast. 11/06/24   [name], NP  valACYclovir  (VALTREX ) 1000 MG tablet Take 1 tablet (1,000 mg total) by mouth 3 (three) times daily. 09/02/24   Juleen Rush, PA-C    Allergies: Contrast media [iodinated contrast media]    Review of Systems  Updated Vital Signs BP 111/61 (BP Location: Right Arm)   Pulse 67   Temp 97.6 F (36.4 C) (Axillary)   Resp 20   Ht 5' 4 (1.626 m)   Wt 64.3 kg   SpO2 99%   BMI 24.33 kg/m   Physical Exam Vitals and nursing note reviewed.  Constitutional:      General: She is not in acute distress.    Appearance: She is well-developed.     Comments: Altered.  Occasionally will say ouch.  Moving all 4 extremities  HENT:     Head: Normocephalic and atraumatic.     Right Ear: External ear normal.     Left Ear: External ear normal.     Nose: Nose normal.  Eyes:     Extraocular Movements: Extraocular movements intact.     Conjunctiva/sclera: Conjunctivae normal.     Pupils: Pupils are equal, round, and reactive to light.     Comments: Pupils 4 mm bilaterally  Cardiovascular:     Rate and Rhythm: Normal rate and regular rhythm.     Pulses: Normal pulses.     Heart sounds: Normal heart sounds. No murmur heard. Pulmonary:     Effort: Pulmonary effort is normal. No respiratory distress.     Breath sounds: Normal breath sounds.  Abdominal:     General: Abdomen is flat. There is no distension.     Palpations: Abdomen is soft. There is no mass.     Tenderness: There is no abdominal tenderness. There is no guarding.  Musculoskeletal:     Cervical back: Normal range of motion and neck supple.     Right lower leg: No edema.     Left lower leg: No edema.  Skin:    General: Skin is warm and dry.  Neurological:     Mental Status: She is disoriented.     Comments: Moving all 4 extremities spontaneously.  Says ouch when pinched in all 4 extremities.   Otherwise does not speaking or following commands  Psychiatric:        Mood and Affect: Mood normal.     (all labs ordered are listed, but only abnormal results are displayed) Labs Reviewed  CBC WITH DIFFERENTIAL/PLATELET - Abnormal; Notable for the following components:      Result Value   RBC 3.13 (*)    Hemoglobin 10.8 (*)    HCT 32.8 (*)    MCV 104.8 (*)    MCH 34.5 (*)  RDW 18.7 (*)    Platelets 120 (*)    All other components within normal limits  COMPREHENSIVE METABOLIC PANEL WITH GFR - Abnormal; Notable for the following components:   Potassium 5.3 (*)    BUN 32 (*)    Creatinine, Ser 1.53 (*)    Albumin  3.3 (*)    AST 79 (*)    ALT 49 (*)    Alkaline Phosphatase 228 (*)    Total Bilirubin 1.8 (*)    GFR, Estimated 36 (*)    All other components within normal limits  AMMONIA - Abnormal; Notable for the following components:   Ammonia 222 (*)    All other components within normal limits  URINALYSIS, ROUTINE W REFLEX MICROSCOPIC  TSH  BASIC METABOLIC PANEL WITH GFR  CBC    EKG: None  Radiology: DG Chest Portable 1 View Result Date: 11/28/2024 CLINICAL DATA:  Altered mental status EXAM: PORTABLE CHEST 1 VIEW COMPARISON:  07/01/2024 FINDINGS: Hypoventilatory changes. Cardiomegaly. No focal opacity, pleural effusion or pneumothorax. Minimal atelectasis or scar in the left lower lung. Aortic atherosclerosis. Tips in the right upper quadrant. IMPRESSION: No active disease. Cardiomegaly. Electronically Signed   By: Luke Bun M.D.   On: 11/28/2024 15:44   CT Head Wo Contrast Result Date: 11/28/2024 CLINICAL DATA:  Mental status change encephalopathy EXAM: CT HEAD WITHOUT CONTRAST TECHNIQUE: Contiguous axial images were obtained from the base of the skull through the vertex without intravenous contrast. RADIATION DOSE REDUCTION: This exam was performed according to the departmental dose-optimization program which includes automated exposure control, adjustment of the  mA and/or kV according to patient size and/or use of iterative reconstruction technique. COMPARISON:  CT 06/19/2024, MRI 05/24/2024 FINDINGS: Brain: No acute territorial infarction, hemorrhage or intracranial mass. Small chronic left occipital and cerebellar infarcts. Chronic lacunar infarct right thalamus. Scattered white matter hypodensity consistent with chronic small vessel disease. No ventricular enlargement. Vascular: No hyperdense vessels.  Carotid vascular calcification Skull: No fracture Sinuses/Orbits: No acute finding. Other: None IMPRESSION: 1. No CT evidence for acute intracranial abnormality. 2. Chronic small vessel disease of the white matter. Chronic left PCA infarct Electronically Signed   By: Luke Bun M.D.   On: 11/28/2024 15:43     Procedures   Medications Ordered in the ED  acetaminophen  (TYLENOL ) tablet 650 mg (has no administration in time range)    Or  acetaminophen  (TYLENOL ) suppository 650 mg (has no administration in time range)  senna-docusate (Senokot-S) tablet 1 tablet (has no administration in time range)  bisacodyl  (DULCOLAX) EC tablet 5 mg (has no administration in time range)  ondansetron  (ZOFRAN ) tablet 4 mg (has no administration in time range)    Or  ondansetron  (ZOFRAN ) injection 4 mg (has no administration in time range)  heparin  injection 5,000 Units (5,000 Units Subcutaneous Given 11/28/24 1807)  lactulose  (CHRONULAC ) enema 200 gm (has no administration in time range)  0.9 %  sodium chloride  infusion ( Intravenous Infusion Verify 11/28/24 1800)  Oral care mouth rinse (has no administration in time range)  lactulose  (CHRONULAC ) enema 200 gm (300 mLs Rectal Given 11/28/24 1630)  sodium chloride  0.9 % bolus 1,000 mL (1,000 mLs Intravenous New Bag/Given 11/28/24 1752)    Clinical Course as of 11/28/24 1907  Tue Nov 28, 2024  1622 Dr Lou from hospitalist to admit the patient [RP]    Clinical Course User Index [RP] Yolande Lamar BROCKS, MD  Medical Decision Making Amount and/or Complexity of Data Reviewed Labs: ordered.  Risk Decision regarding hospitalization.   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  Initial Ddx:  Hepatic encephalopathy, ICH, stroke, mass, UTI  MDM/Course:  Patient presents emergency department with confusion.  Does have a history of cirrhosis and hepatic encephalopathy.  Has had a TIPS procedure which places her at increased risk for this.  On exam she is quite disoriented and somnolent.  She is not cooperating with neurologic exam but is moving her extremities spontaneously.  I do not appreciate any focal neurologic deficits.  Appears to be protecting her airway at this point in time.  Husband says that she had a large bowel movement prior to arrival and would like to try an enema prior to NG tube.  I let him know that we will try this but I have a strong suspicion that she will eventually need an NG tube to treat her hepatic encephalopathy since she is not going to take oral lactulose .  CT head without acute findings.  Chest x-ray without acute findings.  Lab work was drawn and it appears that she has worsening renal function and hepatic function with an ammonia of 222.  Upon re-evaluation remained stable.  Ordered for lactulose  enema and admitted to hospitalist for further management.  Reports that her levothyroxine  was adjusted recently and she had some constipation which she believes is causing hepatic encephalopathy.  TSH was sent off  This patient presents to the ED for concern of complaints listed in HPI, this involves an extensive number of treatment options, and is a complaint that carries with it a high risk of complications and morbidity. Disposition including potential need for admission considered.   Dispo: Admit  I have reviewed  the patients home medications and made adjustments as needed Additional history obtained from spouse Records reviewed Outpatient Clinic Notes The following labs were independently interpreted: Chemistry and show AKI I independently reviewed the following imaging with scope of interpretation limited to determining acute life threatening conditions related to emergency care: CT Head and agree with the radiologist interpretation with the following exceptions: none I personally reviewed and interpreted cardiac monitoring: normal sinus rhythm  I personally reviewed and interpreted the pt's EKG: see above for interpretation  Social Determinants of health:  Geriatric  Portions of this note were generated with Scientist, clinical (histocompatibility and immunogenetics). Dictation errors may occur despite best attempts at proofreading.     Final diagnoses:  Hepatic encephalopathy [facility])    ED Discharge Orders     None          Yolande Lamar BROCKS, MD 11/28/24 1907  "

## 2024-11-28 NOTE — ED Triage Notes (Signed)
 Pt via EMS from home c/o AMS. Hx hepatic encephalopathy, current GCS 12 per EMS report. Pt's husband reports that he went to get her dressed and found that she had had a large incontinent BM. She receives 3tbsp 4x daily of lactulose  due to dx enceph. Pt has mostly been nonverbal or consistently following commands.  CBG 112 BP 134/70 after 500cc saline HR 70s O2 97% RA  EKG unremarkable  20ga IV in left AC

## 2024-11-29 ENCOUNTER — Other Ambulatory Visit: Payer: Self-pay

## 2024-11-29 DIAGNOSIS — K7682 Hepatic encephalopathy: Secondary | ICD-10-CM | POA: Diagnosis not present

## 2024-11-29 LAB — COMPREHENSIVE METABOLIC PANEL WITH GFR
ALT: 39 U/L (ref 0–44)
AST: 63 U/L — ABNORMAL HIGH (ref 15–41)
Albumin: 3 g/dL — ABNORMAL LOW (ref 3.5–5.0)
Alkaline Phosphatase: 196 U/L — ABNORMAL HIGH (ref 38–126)
Anion gap: 11 (ref 5–15)
BUN: 31 mg/dL — ABNORMAL HIGH (ref 8–23)
CO2: 21 mmol/L — ABNORMAL LOW (ref 22–32)
Calcium: 9.2 mg/dL (ref 8.9–10.3)
Chloride: 109 mmol/L (ref 98–111)
Creatinine, Ser: 1.37 mg/dL — ABNORMAL HIGH (ref 0.44–1.00)
GFR, Estimated: 41 mL/min — ABNORMAL LOW
Glucose, Bld: 82 mg/dL (ref 70–99)
Potassium: 4.5 mmol/L (ref 3.5–5.1)
Sodium: 141 mmol/L (ref 135–145)
Total Bilirubin: 1.5 mg/dL — ABNORMAL HIGH (ref 0.0–1.2)
Total Protein: 5.8 g/dL — ABNORMAL LOW (ref 6.5–8.1)

## 2024-11-29 LAB — URINALYSIS, ROUTINE W REFLEX MICROSCOPIC
Bilirubin Urine: NEGATIVE
Glucose, UA: NEGATIVE mg/dL
Ketones, ur: NEGATIVE mg/dL
Nitrite: NEGATIVE
Protein, ur: 30 mg/dL — AB
Specific Gravity, Urine: 1.011 (ref 1.005–1.030)
pH: 9 — ABNORMAL HIGH (ref 5.0–8.0)

## 2024-11-29 LAB — CBC
HCT: 29.6 % — ABNORMAL LOW (ref 36.0–46.0)
Hemoglobin: 10.1 g/dL — ABNORMAL LOW (ref 12.0–15.0)
MCH: 35.6 pg — ABNORMAL HIGH (ref 26.0–34.0)
MCHC: 34.1 g/dL (ref 30.0–36.0)
MCV: 104.2 fL — ABNORMAL HIGH (ref 80.0–100.0)
Platelets: 103 K/uL — ABNORMAL LOW (ref 150–400)
RBC: 2.84 MIL/uL — ABNORMAL LOW (ref 3.87–5.11)
RDW: 18.6 % — ABNORMAL HIGH (ref 11.5–15.5)
WBC: 3.8 K/uL — ABNORMAL LOW (ref 4.0–10.5)
nRBC: 0 % (ref 0.0–0.2)

## 2024-11-29 LAB — PROTIME-INR
INR: 1.4 — ABNORMAL HIGH (ref 0.8–1.2)
Prothrombin Time: 17.5 s — ABNORMAL HIGH (ref 11.4–15.2)

## 2024-11-29 MED ORDER — MUSCLE RUB 10-15 % EX CREA
TOPICAL_CREAM | CUTANEOUS | Status: DC | PRN
  Administered 2024-11-29: 1 via TOPICAL
  Filled 2024-11-29 (×2): qty 85

## 2024-11-29 NOTE — Plan of Care (Signed)

## 2024-11-29 NOTE — Hospital Course (Addendum)
 [name] is a 75 y.o. female with PMH of  cirrhosis 2/2 chemotherapy s/p TIPS c/b hepatic encephalopathy, colon cancer s/p hemicolectomy, CVA, IDA, hypothyroidism, HTN and gastritis who presented to the ED for evaluation of altered mental status. As per spouse, patient is prescribed 3 teaspoons of lactulose  4 times a day however he has been doing 3 times a day with good 2-3 bowel movements a day. Synthroid  dose was reduced from 75 to 50 mcg 3 weeks ago.  Over the last week, he has noticed patient has had decreased bowel movement on her current lactulose  dose. Over the last few days, she has had mild tremor and has become more sleepy but has been able to have conversations as usual  but on 1/6 am patient w/ significant amount of stool and altered more than her baseline so he presented her to the ED for further evaluation.  ED Course: Initial vitals show patient afebrile and normotensive. Initial labs significant for K+ 5.3, BUNs/creatinine 32/1.53, AST/ALT 79/49, alk phos 228, ammonia 222, WBC 5.0, Hgb 10.8, platelet 120, glucose 84. CXR shows cardiomegaly but no active disease. CT head with no acute intracranial abnormality. Patient given IV fluids and admitted w/ lactulose  enema. Mental status was fluctuating, MRI brain unremarkable.  Seen by GI, continued on rifaximin  subsequently able to take p.o. lactulose  and dose titrated.  Subjective: Seen and examined Aaox3, weak frail. Had BM this am drinking now Overnight temperature PRN fluctuating BP relatively soft 80s-90s, labs reviewed pancytopenia hemoglobin around 7.2, creatinine trending up 1.2  Assessment and plan:  Acute hepatic encephalopathy Liver cirrhosis secondary to chemo status post TIPS History of esophageal varices and portal hypertension: HE w/ decreasing bowel movement despite being on lactulose .Initial ammonia level 222 WBC stable, UA unremarkable. Blood culture 1/8- NGTD,CT head/ MRI brain NAD.No other obvious trigger Mental  status fluctuating started on high-dose b1 on 1/10, mentation improved  Cont  regular diet OOB PT OT.  Enema changed to lactulose  40 g  tid 1/12-  Titrate lactulose  for BM ~3 Appreciate GI input. Cont rifaximin   Hypotension: At baseline soft BP and in the past was on midodrine  and was weaned off. Doing 1 u prbc and started midodrine - wean off ivf.  Pancytopenia: Due to her liver cirrhosis.Trend labs-baseline hemoglobin around 10, was cose to 6.8 in July, she is weak and symptomatic anemia - will transfuse 1 u prbc . Recent Labs  Lab 12/03/24 0302 12/04/24 0255 12/05/24 0308  HGB 7.8* 7.2* 7.2*  HCT 24.6* 21.9* 22.0*  WBC 2.2* 2.4* 3.0*  PLT 107* 101* 105*   Hypothermia: Transferred to stepdown 1/7 overnight for [employer]No obvious signs of infection. BUT W/ anemia and hypoglycemia Cortisol stable 10.5. she has had recurrent issues w/ hypothermia on chart review back in July and August. Suspected her cirrhosis, poor oral intake contributing.  Urine retention: Off foley catheter.  Hypoglycemia: Resolved.  GERD: Cont ppi  Hypothyroidism: Recently Synthroid  dose changed 75 mcg>50 mcg in mid December-Repeat TSH 1.33.ft4 ok.  AKI Mild metabolic acidosis Hyperkalemia-resolved Hyperchloremia: AKI with creatinine trending up, given softer blood pressure placed on midodrine , give gentle IV fluids encourage p.o. intake monitor electrolytes Home Lasix  and aldactone  on hold for now  CVA hx HTN: BP stable.Po meds on hold as BP soft  GERD: Continue PPI  GOC: Patient has significant medical comorbidities at high risk for recurrent admission, decompensation, consulted PMT. She is full code at this time with full scope of treatment although remains at high risk  Overall prognosis not bright on long-term  Mobility: PT Orders: Active PT Follow up Rec: Outpatient Pt (Pt/Spouse Preference)11/30/2024 1600    DVT prophylaxis: heparin  injection 5,000 Units Start: 11/28/24 1730 Code  Status:   Code Status: Full Code Family Communication: plan of care discussed with patient and her husband at bedside Patient status is: Remains hospitalized because of severity of illness Level of care: Stepdown   Dispo: The patient is from: Home with husband            Anticipated disposition: tbd Objective: Vitals last 24 hrs: Vitals:   12/05/24 0400 12/05/24 0447 12/05/24 0600 12/05/24 0800  BP: (!) 93/43 (!) 96/39 (!) 91/36 (!) 92/33  Pulse: 64 (!) 59 64 63  Resp: 15 13 14 14   Temp:  (!) 97.4 F (36.3 C)  98.2 F (36.8 C)  TempSrc:  Oral  Oral  SpO2: 92% 93% 96% 96%  Weight:      Height:       Physical Examination: General exam: AAOX3  weak HEENT:Oral mucosa moist, Ear/Nose WNL grossly Respiratory system: Bilaterally clear B Cardiovascular system: S1 & S2 +, No JVD. Gastrointestinal system: Abdomen soft, nontender nondistended ,BS+, fms+ Nervous System: Alert, awake oriented and conversant, follows commands  Extremities: extremities warm, leg edema mild Skin: Warm, no rashes MSK: Normal muscle bulk,tone, power   Medications reviewed:  Scheduled Meds:  sodium chloride    Intravenous Once   Chlorhexidine  Gluconate Cloth  6 each Topical Daily   feeding supplement  1 Container Oral TID BM   Gerhardt's butt cream   Topical BID   heparin   5,000 Units Subcutaneous Q8H   lactulose   40 g Oral TID   levothyroxine   50 mcg Oral Q0600   midodrine   10 mg Oral TID WC   pantoprazole  (PROTONIX ) IV  40 mg Intravenous Q24H   rifaximin   550 mg Oral BID   Continuous Infusions:  sodium chloride  75 mL/hr at 12/05/24 0853   sodium chloride  Stopped (12/05/24 0148)    Diet: Diet Order             Diet regular Room service appropriate? Yes; Fluid consistency: Thin  Diet effective now

## 2024-11-29 NOTE — Progress Notes (Signed)
 " PROGRESS NOTE [name]  FMW:[id number] DOB: 1950/11/28 DOA: 11/28/2024 PCP: [name] HERO, NP  Brief Narrative/Hospital Course: [name] is a 74 y.o. female with PMH of  cirrhosis 2/2 chemotherapy s/p TIPS c/b hepatic encephalopathy, colon cancer s/p hemicolectomy, CVA, IDA, hypothyroidism, HTN and gastritis who presented to the ED for evaluation of altered mental status. As per spouse, patient is prescribed 3 teaspoons of lactulose  4 times a day however he has been doing 3 times a day with good 2-3 bowel movements a day. Synthroid  dose was reduced from 75 to 50 mcg 3 weeks ago.  Over the last week, he has noticed patient has had decreased bowel movement on her current lactulose  dose. Over the last few days, she has had mild tremor and has become more sleepy but has been able to have conversations as usual  but on 1/6 am patient w/ significant amount of stool and altered more than her baseline so he presented her to the ED for further evaluation.  ED Course: Initial vitals show patient afebrile and normotensive. Initial labs significant for K+ 5.3, BUNs/creatinine 32/1.53, AST/ALT 79/49, alk phos 228, ammonia 222, WBC 5.0, Hgb 10.8, platelet 120, glucose 84. CXR shows cardiomegaly but no active disease. CT head with no acute intracranial abnormality.  Patient given IV fluids and admitted w/ lactulose  enema.  Subjective: Seen and examined today Patient was alert awake answering some questions but still confused. Met husband in the hallway he feels she is more alert awake and responding bit more, but significantly confused still, wondering about her ammonia level. Overnight on room air afebrile, VSS, Labs-creatinine down 1.5> 1.3, AST ALT slightly better T. bili 1.5 CBC with mild leukopenia anemia and thrombocytopenia overall same   Assessment and plan:  Acute hepatic encephalopathy Liver cirrhosis secondary to chemo status post TIPS History of esophageal varices and portal  hypertension: Patient presenting with altered mental status tremor and decreasing bowel movement despite being on lactulose  In the ED confused CT head no acute finding ammonia level 222, he is afebrile WBC stable Continue lactulose  enema 300 mL twice daily, w/ goal of 3-4 BM/day, uptitrate PRN Will consider NG tube for lactulose  if no stool output with lactulose  enemas-overall mentation seems to be improving last BM 1/7 Given gentle IV fluid hydration. Keep in delirium Precaution, Obtain PT OT Eval   Hypothyroidism Synthroid  dose reduced from 75 mcg to 50 mcg in mid December per spouse- Repeat TSH improved to 1.33.  Resume once able to take p.o.  AKI: Creatinine slowly improving continue gentle hydration , Trent BMP, avoid hypotension and nephrotoxic meds  Hyperkalemia Resolved   Chronic thrombocytopenia Chronic anemia Mild leukopenia: Suspect in the setting of liver cirrhosis.  Trend labs   CVA HTN: BP stable. Resume meds after improvement in mental status  GERD: Continue PPI  Mobility: PT Orders: Active PT Follow up Rec:     DVT prophylaxis: heparin  injection 5,000 Units Start: 11/28/24 1730 Code Status:   Code Status: Full Code Family Communication: plan of care discussed with patient at bedside.  Discussed with the husband in the hallway Patient status is: Remains hospitalized because of severity of illness Level of care: Progressive   Dispo: The patient is from: Home with husband            Anticipated disposition: TBD Objective: Vitals last 24 hrs: Vitals:   11/28/24 1800 11/28/24 2044 11/29/24 0025 11/29/24 0542  BP:  112/65 129/68 (!) 137/39  Pulse:  66 (!)  55 61  Resp:  16 17 17   Temp:   97.9 F (36.6 C) 97.9 F (36.6 C)  TempSrc:   Oral Oral  SpO2:  99% 100% 100%  Weight: 64.3 kg     Height:        Physical Examination:  General exam: alert awake , but not oriented  HEENT:Oral mucosa moist, Ear/Nose WNL grossly Respiratory system: Bilaterally  clear BS,no use of accessory muscle Cardiovascular system: S1 & S2 +, No JVD. Gastrointestinal system: Abdomen soft,NT,ND, BS+ Nervous System: Alert, awake, moving  arms, and  extremities Extremities: extremities warm, leg edema ned Skin: Warm, no rashes MSK: Normal muscle bulk,tone, power   Medications reviewed:  Scheduled Meds:  heparin   5,000 Units Subcutaneous Q8H   lactulose   300 mL Rectal BID   Continuous Infusions:  sodium chloride  100 mL/hr at 11/29/24 1034   Diet: Diet Order     None        Data Reviewed: I have personally reviewed following labs and imaging studies ( see epic result tab) CBC: Recent Labs  Lab 11/28/24 1442 11/29/24 0411  WBC 5.0 3.8*  NEUTROABS 3.7  --   HGB 10.8* 10.1*  HCT 32.8* 29.6*  MCV 104.8* 104.2*  PLT 120* 103*   CMP: Recent Labs  Lab 11/28/24 1442 11/29/24 0411  NA 139 141  K 5.3* 4.5  CL 102 109  CO2 25 21*  GLUCOSE 84 82  BUN 32* 31*  CREATININE 1.53* 1.37*  CALCIUM  10.0 9.2   GFR: Estimated Creatinine Clearance: 31.6 mL/min (A) (by C-G formula based on SCr of 1.37 mg/dL (H)). Recent Labs  Lab 11/28/24 1442 11/29/24 0411  AST 79* 63*  ALT 49* 39  ALKPHOS 228* 196*  BILITOT 1.8* 1.5*  PROT 6.6 5.8*  ALBUMIN  3.3* 3.0*   No results for input(s): LIPASE, AMYLASE in the last 168 hours.  Recent Labs  Lab 11/28/24 1442  AMMONIA 222*   Coagulation Profile:  Recent Labs  Lab 11/29/24 0411  INR 1.4*   Unresulted Labs (From admission, onward)     Start     Ordered   11/30/24 0500  Comprehensive metabolic panel with GFR  Daily,   R       Question:  Specimen collection method  Answer:  Lab=Lab collect   11/29/24 0951   11/30/24 0500  CBC  Tomorrow morning,   R       Question:  Specimen collection method  Answer:  Lab=Lab collect   11/29/24 0951   11/30/24 0500  Ammonia  Tomorrow morning,   R       Question:  Specimen collection method  Answer:  Lab=Lab collect   11/29/24 1308   11/28/24 1414  Urinalysis,  Routine w reflex microscopic -Urine, Clean Catch  Once,   URGENT       Question Answer Comment  Obtain urine by in and out catheter if not obtained within 30 minutes of placing in a treatment room? Yes   Specimen Source Urine, Clean Catch   Release to patient Immediate      11/28/24 1414           Antimicrobials/Microbiology: Anti-infectives (From admission, onward)    None         Component Value Date/Time   SDES  07/01/2024 1033    BLOOD RIGHT ARM Performed at [facility], [address], [place], [place] [address]    SDES  07/01/2024 [phone]    BLOOD RIGHT HAND  Performed at [facility], [address] [place], [place] [address]    SPECREQUEST  07/01/2024 1033    BOTTLES DRAWN AEROBIC ONLY Blood Culture results may not be optimal due to an inadequate volume of blood received in culture bottles Performed at [facility], [address], [place], [place] [address]    SPECREQUEST  07/01/2024 1033    BOTTLES DRAWN AEROBIC ONLY Blood Culture results may not be optimal due to an inadequate volume of blood received in culture bottles Performed at [facility], [address], [place], [place] [address]    CULT  07/01/2024 1033    NO GROWTH 5 DAYS Performed at [facility], [address], [place], [place] [address]    CULT  07/01/2024 1033    NO GROWTH 5 DAYS Performed at [facility], [address], [place], [place] [address]    REPTSTATUS 07/06/2024 FINAL 07/01/2024 1033   REPTSTATUS 07/06/2024 FINAL 07/01/2024 1033    Procedures:    Mennie LAMY, MD [facility] 11/29/2024, 1:08 PM   "

## 2024-11-29 NOTE — Progress Notes (Signed)
 Pt obtained new irritation on vaginal area that was found during bath this morning due to her scratching, minimal bleeding noted; cream applied and purewick removed. Now resting in bed comfortably with husband at bedside.

## 2024-11-29 NOTE — Progress Notes (Signed)
 PT Cancellation Note  Patient Details Name: [name] MRN: [id number] DOB: [birth year]   Cancelled Treatment:    Reason Eval/Treat Not Completed: Fatigue/lethargy limiting ability to participate. Pt resting soundly upon arrival, spouse at bedside reports pt still not mentally clear and requesting therapy check back tomorrow for PT eval. Will continue to follow for PT eval as schedule permits.   Metta Ave PT, DPT 11/29/2024, 2:38 PM

## 2024-11-30 DIAGNOSIS — D689 Coagulation defect, unspecified: Secondary | ICD-10-CM | POA: Diagnosis not present

## 2024-11-30 DIAGNOSIS — K7469 Other cirrhosis of liver: Secondary | ICD-10-CM

## 2024-11-30 DIAGNOSIS — N179 Acute kidney failure, unspecified: Secondary | ICD-10-CM | POA: Diagnosis not present

## 2024-11-30 DIAGNOSIS — K7581 Nonalcoholic steatohepatitis (NASH): Secondary | ICD-10-CM | POA: Diagnosis not present

## 2024-11-30 DIAGNOSIS — D72819 Decreased white blood cell count, unspecified: Secondary | ICD-10-CM | POA: Diagnosis not present

## 2024-11-30 DIAGNOSIS — I63449 Cerebral infarction due to embolism of unspecified cerebellar artery: Secondary | ICD-10-CM | POA: Diagnosis not present

## 2024-11-30 DIAGNOSIS — K766 Portal hypertension: Secondary | ICD-10-CM | POA: Diagnosis not present

## 2024-11-30 DIAGNOSIS — D539 Nutritional anemia, unspecified: Secondary | ICD-10-CM | POA: Diagnosis not present

## 2024-11-30 DIAGNOSIS — K7682 Hepatic encephalopathy: Secondary | ICD-10-CM | POA: Diagnosis not present

## 2024-11-30 DIAGNOSIS — K219 Gastro-esophageal reflux disease without esophagitis: Secondary | ICD-10-CM

## 2024-11-30 DIAGNOSIS — D696 Thrombocytopenia, unspecified: Secondary | ICD-10-CM

## 2024-11-30 LAB — COMPREHENSIVE METABOLIC PANEL WITH GFR
ALT: 36 U/L (ref 0–44)
AST: 63 U/L — ABNORMAL HIGH (ref 15–41)
Albumin: 2.6 g/dL — ABNORMAL LOW (ref 3.5–5.0)
Alkaline Phosphatase: 154 U/L — ABNORMAL HIGH (ref 38–126)
Anion gap: 8 (ref 5–15)
BUN: 24 mg/dL — ABNORMAL HIGH (ref 8–23)
CO2: 20 mmol/L — ABNORMAL LOW (ref 22–32)
Calcium: 8.8 mg/dL — ABNORMAL LOW (ref 8.9–10.3)
Chloride: 116 mmol/L — ABNORMAL HIGH (ref 98–111)
Creatinine, Ser: 1.13 mg/dL — ABNORMAL HIGH (ref 0.44–1.00)
GFR, Estimated: 51 mL/min — ABNORMAL LOW
Glucose, Bld: 66 mg/dL — ABNORMAL LOW (ref 70–99)
Potassium: 4.6 mmol/L (ref 3.5–5.1)
Sodium: 144 mmol/L (ref 135–145)
Total Bilirubin: 1.1 mg/dL (ref 0.0–1.2)
Total Protein: 4.7 g/dL — ABNORMAL LOW (ref 6.5–8.1)

## 2024-11-30 LAB — CBC
HCT: 25.4 % — ABNORMAL LOW (ref 36.0–46.0)
Hemoglobin: 8.5 g/dL — ABNORMAL LOW (ref 12.0–15.0)
MCH: 35.6 pg — ABNORMAL HIGH (ref 26.0–34.0)
MCHC: 33.5 g/dL (ref 30.0–36.0)
MCV: 106.3 fL — ABNORMAL HIGH (ref 80.0–100.0)
Platelets: 97 K/uL — ABNORMAL LOW (ref 150–400)
RBC: 2.39 MIL/uL — ABNORMAL LOW (ref 3.87–5.11)
RDW: 19.2 % — ABNORMAL HIGH (ref 11.5–15.5)
WBC: 2 K/uL — ABNORMAL LOW (ref 4.0–10.5)
nRBC: 0 % (ref 0.0–0.2)

## 2024-11-30 LAB — T4, FREE: Free T4: 1.18 ng/dL (ref 0.80–2.00)

## 2024-11-30 LAB — GLUCOSE, CAPILLARY
Glucose-Capillary: 61 mg/dL — ABNORMAL LOW (ref 70–99)
Glucose-Capillary: 85 mg/dL (ref 70–99)

## 2024-11-30 LAB — CORTISOL: Cortisol, Plasma: 10.5 ug/dL

## 2024-11-30 LAB — AMMONIA: Ammonia: 43 umol/L — ABNORMAL HIGH (ref 9–35)

## 2024-11-30 LAB — MRSA NEXT GEN BY PCR, NASAL: MRSA by PCR Next Gen: NOT DETECTED

## 2024-11-30 MED ORDER — LACTULOSE 10 GM/15ML PO SOLN
30.0000 g | Freq: Three times a day (TID) | ORAL | Status: DC
  Administered 2024-11-30 – 2024-12-01 (×4): 30 g via ORAL
  Filled 2024-11-30 (×4): qty 45

## 2024-11-30 MED ORDER — DEXTROSE-SODIUM CHLORIDE 5-0.45 % IV SOLN: INTRAVENOUS | Status: AC

## 2024-11-30 MED ORDER — RIFAXIMIN 550 MG PO TABS
550.0000 mg | ORAL_TABLET | Freq: Two times a day (BID) | ORAL | Status: DC
  Administered 2024-11-30 – 2024-12-07 (×12): 550 mg via ORAL
  Filled 2024-11-30 (×14): qty 1

## 2024-11-30 MED ORDER — LEVOTHYROXINE SODIUM 50 MCG PO TABS
50.0000 ug | ORAL_TABLET | Freq: Every day | ORAL | Status: DC
  Administered 2024-11-30 – 2024-12-07 (×6): 50 ug via ORAL
  Filled 2024-11-30 (×2): qty 2
  Filled 2024-11-30: qty 1
  Filled 2024-11-30: qty 2
  Filled 2024-11-30: qty 1
  Filled 2024-11-30: qty 2

## 2024-11-30 MED ORDER — PANTOPRAZOLE SODIUM 40 MG IV SOLR
40.0000 mg | INTRAVENOUS | Status: DC
  Administered 2024-11-30 – 2024-12-07 (×8): 40 mg via INTRAVENOUS
  Filled 2024-11-30 (×8): qty 10

## 2024-11-30 MED ORDER — SODIUM CHLORIDE 0.9 % IV SOLN: INTRAVENOUS | Status: DC

## 2024-11-30 MED ORDER — BOOST / RESOURCE BREEZE PO LIQD CUSTOM
1.0000 | Freq: Three times a day (TID) | ORAL | Status: DC
  Administered 2024-11-30 – 2024-12-01 (×2): 237 mL via ORAL
  Administered 2024-12-03 – 2024-12-05 (×3): 1 via ORAL

## 2024-11-30 MED ORDER — POLYVINYL ALCOHOL 1.4 % OP SOLN
1.0000 [drp] | OPHTHALMIC | Status: DC | PRN
  Administered 2024-11-30: 1 [drp] via OPHTHALMIC
  Filled 2024-11-30: qty 15

## 2024-11-30 MED ORDER — GERHARDT'S BUTT CREAM
TOPICAL_CREAM | Freq: Two times a day (BID) | CUTANEOUS | Status: DC
  Administered 2024-12-01 – 2024-12-04 (×2): 1 via TOPICAL
  Filled 2024-11-30 (×2): qty 60

## 2024-11-30 MED ORDER — CHLORHEXIDINE GLUCONATE CLOTH 2 % EX PADS
6.0000 | MEDICATED_PAD | Freq: Every day | CUTANEOUS | Status: DC
  Administered 2024-12-01 – 2024-12-07 (×8): 6 via TOPICAL

## 2024-11-30 NOTE — Progress Notes (Signed)
" °   11/30/24 0046  Assess: MEWS Score  Temp (!) 93.9 F (34.4 C)  BP 124/62  MAP (mmHg) 79  Pulse Rate 63  Level of Consciousness Alert  SpO2 97 %  O2 Device Room Air  Assess: MEWS Score  MEWS Temp 2  MEWS Systolic 0  MEWS Pulse 0  MEWS RR 0  MEWS LOC 0  MEWS Score 2  MEWS Score Color Yellow  Assess: if the MEWS score is Yellow or Red  Were vital signs accurate and taken at a resting state? Yes  Does the patient meet 2 or more of the SIRS criteria? No  MEWS guidelines implemented  Yes, yellow  Treat  MEWS Interventions Considered administering scheduled or prn medications/treatments as ordered  Take Vital Signs  Increase Vital Sign Frequency  Yellow: Q2hr x1, continue Q4hrs until patient remains green for 12hrs  Escalate  MEWS: Escalate Yellow: Discuss with charge nurse and consider notifying provider and/or RRT  Provider Notification  Provider Name/Title Andrez, NP  Date Provider Notified 11/30/24  Time Provider Notified 0101  Method of Notification Page  Notification Reason Other (Comment) (Temp 93.9)  Provider response No new orders  Date of Provider Response 11/30/24  Time of Provider Response 0102  Assess: SIRS CRITERIA  SIRS Temperature  1  SIRS Respirations  0  SIRS Pulse 0  SIRS WBC 0  SIRS Score Sum  1   Warm blankets provided. Will continue to monitor pt  "

## 2024-11-30 NOTE — Progress Notes (Signed)
 Patient transferred to SD level of care for bair hugger due to chronic hypothermia.

## 2024-11-30 NOTE — TOC Initial Note (Signed)
 Transition of Care [facility]) - Initial/Assessment Note    Patient Details  Name: [name] MRN: [id number] Date of Birth: 1951-03-29  [facility] CM/SW Contact:    Jon ONEIDA Anon, RN Phone Number: 11/30/2024, 4:28 PM  Clinical Narrative:                 Pt is from home with spouse. Pt oriented to self only. Pt brought in via ambulance with complaints of AMS. Pt has hx of hepatic encephalopathy. Palliative medicine has been consulted. PT/OT consulted, with OT recommending OP OT, awaiting PT recommendations. ICM will continue to follow for DC planning needs.  Expected Discharge Plan:  (TBD) Barriers to Discharge: Continued Medical Work up   Patient Goals and CMS Choice Patient states their goals for this hospitalization and ongoing recovery are:: Home CMS Medicare.gov Compare Post Acute Care list provided to:: Patient Represenative (must comment) (Pt spouse) Choice offered to / list presented to : Spouse [place] ownership interest in [facility].provided to:: Spouse    Expected Discharge Plan and Services In-house Referral: Hospice / Palliative Care Discharge Planning Services: CM Consult Post Acute Care Choice: Durable Medical Equipment Living arrangements for the past 2 months: Single Family Home                 DME Arranged: N/A DME Agency: NA       HH Arranged: NA HH Agency: NA        Prior Living Arrangements/Services Living arrangements for the past 2 months: Single Family Home Lives with:: Spouse Patient language and need for interpreter reviewed:: Yes Do you feel safe going back to the place where you live?: Yes      Need for Family Participation in Patient Care: Yes (Comment) Care giver support system in place?: Yes (comment) Current home services: DME Criminal Activity/Legal Involvement Pertinent to Current Situation/Hospitalization: No - Comment as needed  Activities of Daily Living   ADL Screening (condition at time of  admission) Independently performs ADLs?: No Does the patient have a NEW difficulty with bathing/dressing/toileting/self-feeding that is expected to last >3 days?: Yes (Initiates electronic notice to provider for possible OT consult) Does the patient have a NEW difficulty with getting in/out of bed, walking, or climbing stairs that is expected to last >3 days?: Yes (Initiates electronic notice to provider for possible PT consult) Does the patient have a NEW difficulty with communication that is expected to last >3 days?: No Is the patient deaf or have difficulty hearing?: No Does the patient have difficulty seeing, even when wearing glasses/contacts?: No Does the patient have difficulty concentrating, remembering, or making decisions?: No  Permission Sought/Granted Permission sought to share information with : Family Supports    Share Information with NAME: Ameyah, [place]  Spouse, Emergency Contact  [phone]           Emotional Assessment   Attitude/Demeanor/Rapport: Unable to Assess Affect (typically observed): Unable to Assess Orientation: : Oriented to Self Alcohol  / Substance Use: Not Applicable Psych Involvement: No (comment)  Admission diagnosis:  Hepatic encephalopathy (HCC) [K76.82] Patient Active Problem List   Diagnosis Date Noted   AMS (altered mental status) 07/01/2024   Heme positive stool 05/28/2024   Acute gastric ulcer without hemorrhage or perforation 05/28/2024   Gastritis and gastroduodenitis 05/28/2024   Acute on chronic anemia 05/27/2024   SIRS (systemic inflammatory response syndrome) (HCC) 05/21/2024   Acute prerenal azotemia 05/21/2024   Paroxysmal atrial fibrillation (HCC) 05/21/2024   Acute  metabolic encephalopathy 05/20/2024   Vision disturbance 05/11/2024   Patent foramen ovale 04/16/2024   Acute CVA (cerebrovascular accident) (HCC) 04/15/2024   Abnormal CBC 04/10/2024   Hyperkalemia 04/05/2024   Hypoglycemia 04/05/2024   Macrocytic  anemia 04/05/2024   Cerebrovascular accident (CVA) (HCC) 03/16/2024   History of stroke 01/06/2024   Thrombocytopenia 12/21/2023   Decreased GFR 12/10/2023   Preventative health care 09/09/2023   Lower extremity edema 08/10/2023   Cellulitis of both lower extremities 08/04/2023   Altered mental status 08/04/2023   Bradycardia 05/07/2023   Protein-calorie malnutrition, severe 02/26/2023   Weight loss 02/24/2023   Other constipation 02/24/2023   Generalized abdominal pain 02/24/2023   Hospital discharge follow-up 02/24/2023   Weakness 01/04/2023   Confusion 08/26/2022   Frequent UTI 05/18/2022   Anemia of chronic disease 05/18/2022   AKI (acute kidney injury) 04/30/2022   Tachycardia-bradycardia syndrome (HCC) 02/05/2022   Hypomagnesemia 02/04/2022   Hepatic encephalopathy (HCC) 02/03/2022   Basal cell carcinoma (BCC) 05/06/2021   Hypertension 05/06/2021   Cirrhosis (HCC) 12/04/2020   Murmur, cardiac 03/19/2020   Acquired hypothyroidism 06/30/2019   History of basal cell cancer 06/30/2019   Hx of colon cancer, stage III 11/30/2011   Portal hypertension (HCC) 01/23/2008   PCP:  [name], NP Pharmacy:   [facility] - [place], [place] - [address] [place] [address] Phone: [phone] Fax: [phone]     Social Drivers of Health (SDOH) Social History: SDOH Screenings   Food Insecurity: No Food Insecurity (10/30/2024)  Housing: Low Risk (10/30/2024)  Transportation Needs: No Transportation Needs (11/28/2024)  Utilities: Not At Risk (11/28/2024)  Alcohol  Screen: Low Risk (03/27/2024)  Depression (PHQ2-9): Low Risk (05/11/2024)  Financial Resource Strain: Low Risk (10/30/2024)  Physical Activity: Insufficiently Active (10/30/2024)  Social Connections: Socially Integrated (11/28/2024)  Stress: No Stress Concern Present (10/30/2024)  Tobacco Use: Low Risk (11/28/2024)   SDOH Interventions:     Readmission  Risk Interventions    11/30/2024    4:24 PM 07/03/2024   10:32 AM 05/29/2024   11:06 AM  Readmission Risk Prevention Plan  Transportation Screening Complete Complete Complete  Medication Review Oceanographer) Complete Complete Complete  PCP or Specialist appointment within 3-5 days of discharge Complete  Complete  HRI or Home Care Consult Complete Complete Complete  SW Recovery Care/Counseling Consult Complete Complete Complete  Palliative Care Screening Not Applicable Not Applicable Not Applicable  Skilled Nursing Facility Complete Not Applicable Not Applicable

## 2024-11-30 NOTE — Progress Notes (Signed)
 " PROGRESS NOTE [name]  FMW:[id number] DOB: [birth year] DOA: 11/28/2024 PCP: [name], NP  Brief Narrative/Hospital Course: [name] is a 74 y.o. female with PMH of  cirrhosis 2/2 chemotherapy s/p TIPS c/b hepatic encephalopathy, colon cancer s/p hemicolectomy, CVA, IDA, hypothyroidism, HTN and gastritis who presented to the ED for evaluation of altered mental status. As per spouse, patient is prescribed 3 teaspoons of lactulose  4 times a day however he has been doing 3 times a day with good 2-3 bowel movements a day. Synthroid  dose was reduced from 75 to 50 mcg 3 weeks ago.  Over the last week, he has noticed patient has had decreased bowel movement on her current lactulose  dose. Over the last few days, she has had mild tremor and has become more sleepy but has been able to have conversations as usual  but on 1/6 am patient w/ significant amount of stool and altered more than her baseline so he presented her to the ED for further evaluation.  ED Course: Initial vitals show patient afebrile and normotensive. Initial labs significant for K+ 5.3, BUNs/creatinine 32/1.53, AST/ALT 79/49, alk phos 228, ammonia 222, WBC 5.0, Hgb 10.8, platelet 120, glucose 84. CXR shows cardiomegaly but no active disease. CT head with no acute intracranial abnormality.  Patient given IV fluids and admitted w/ lactulose  enema.  Subjective: Seen and examined She is more alert awake follows commands. Husband at bedside. Tremulous hand and chins Patient transferred to stepdown overnight due to hypothermia for [employer].  Temperature PRN was 93.7 improving 96.6 Blood sugar also lower in 60s, urine lites shows improving creatinine, worsening leukopenia and anemia thrombocytopenia.  A.m. cortisol 10.5 hemoglobin down in 40s  Assessment and plan:  Acute hepatic encephalopathy Liver cirrhosis secondary to chemo status post TIPS History of esophageal varices and portal hypertension: Patient presenting with  altered mental status tremor and decreasing bowel movement despite being on lactulose  In the ED confused CT head no acute finding ammonia level 222, afebrile WBC stable, UA unremarkable on lactulose  enema 300 mL twice daily noit muhc BM- change to PO as abel to take po meds this am w/goal of 3-4 BM/day, resume rifaximin .   On gentle ivf Keep in delirium Precaution.  Nh3 at 43, mental status slowly improving still with asterixis. Continue PT OT Eval   Hypothermia: Transferred to stepdown overnight for [employer]  No obvious signs of infection on labs leukopenia.  Also anemia and hyperglycemia Cortisol stable 10.5.  Will treat hyperglycemia-change IV fluids. She has had recurrent issues w/ hypothermia on chart review back in July and August.  Suspected her cirrhosis, poor oral intake hypoglycemia contributing  Hypoglycemia: From poor intake cortisol ok. No po intake sicne Monday per husband. Change to d5 ivf  GERD. Cont ppi  Hypothyroidism Synthroid  dose reduced from 75 mcg to 50 mcg in mid December per spouse- Repeat TSH improved to 1.33. check ft4, cont synthroid  iv or po  AKI: Creatinine improved 1.1 cont gentle ivf  Hyperkalemia Resolved   Chronic thrombocytopenia Chronic anemia Mild leukopenia: Suspect in the setting of liver cirrhosis.  Trend labs   CVA hx HTN: BP stable. Po meds on hold pending improvement in mental status   GERD: Continue PPI  GOC: Patient has significant medical comorbidities at high risk for recurrent admission, decompensation, consult PMT. She is full code   Mobility: PT Orders: Active PT Follow up Rec:     DVT prophylaxis: heparin  injection 5,000 Units Start: 11/28/24 1730 Code  Status:   Code Status: Full Code Family Communication: plan of care discussed with patient at bedside.Discussed with the husband  Patient status is: Remains hospitalized because of severity of illness Level of care: Stepdown   Dispo: The patient is from: Home with  husband            Anticipated disposition: TBD Objective: Vitals last 24 hrs: Vitals:   11/30/24 0528 11/30/24 0700 11/30/24 0800 11/30/24 0827  BP:  (!) 119/46 (!) 112/52   Pulse:  83 75 76  Resp:  18 13 12   Temp: (!) 96.6 F (35.9 C)  97.7 F (36.5 C)   TempSrc: Rectal  Oral   SpO2:  96% 96% 96%  Weight:      Height:        Physical Examination: General exam: alert awake oriented bad weak, frail, HEENT:Oral mucosa moist, Ear/Nose WNL grossly Respiratory system: Bilaterally clear BS Cardiovascular system: S1 & S2 +, No JVD. Gastrointestinal system: Abdomen soft,NT,ND, BS+ Nervous System: Alert, awake, moving  arms, and  extremities Extremities: extremities warm, leg edema trace Skin: Warm, no rashes MSK: Normal muscle bulk,tone, power   Medications reviewed:  Scheduled Meds:  Chlorhexidine  Gluconate Cloth  6 each Topical Daily   Gerhardt's butt cream   Topical BID   heparin   5,000 Units Subcutaneous Q8H   lactulose   30 g Oral TID   levothyroxine   50 mcg Oral Q0600   pantoprazole  (PROTONIX ) IV  40 mg Intravenous Q24H   rifaximin   550 mg Oral BID   Continuous Infusions:  dextrose  5 % and 0.45 % NaCl 40 mL/hr at 11/30/24 0908   Diet: Diet Order             Diet clear liquid Room service appropriate? Yes; Fluid consistency: Thin  Diet effective now                    Data Reviewed: I have personally reviewed following labs and imaging studies ( see epic result tab) CBC: Recent Labs  Lab 11/28/24 1442 11/29/24 0411 11/30/24 0320  WBC 5.0 3.8* 2.0*  NEUTROABS 3.7  --   --   HGB 10.8* 10.1* 8.5*  HCT 32.8* 29.6* 25.4*  MCV 104.8* 104.2* 106.3*  PLT 120* 103* 97*   CMP: Recent Labs  Lab 11/28/24 1442 11/29/24 0411 11/30/24 0320  NA 139 141 144  K 5.3* 4.5 4.6  CL 102 109 116*  CO2 25 21* 20*  GLUCOSE 84 82 66*  BUN 32* 31* 24*  CREATININE 1.53* 1.37* 1.13*  CALCIUM  10.0 9.2 8.8*   GFR: Estimated Creatinine Clearance: 38.3 mL/min (A) (by C-G  formula based on SCr of 1.13 mg/dL (H)). Recent Labs  Lab 11/28/24 1442 11/29/24 0411 11/30/24 0320  AST 79* 63* 63*  ALT 49* 39 36  ALKPHOS 228* 196* 154*  BILITOT 1.8* 1.5* 1.1  PROT 6.6 5.8* 4.7*  ALBUMIN  3.3* 3.0* 2.6*   No results for input(s): LIPASE, AMYLASE in the last 168 hours.  Recent Labs  Lab 11/28/24 1442 11/30/24 0320  AMMONIA 222* 43*   Coagulation Profile:  Recent Labs  Lab 11/29/24 0411  INR 1.4*   Unresulted Labs (From admission, onward)     Start     Ordered   12/01/24 0500  CBC with Differential/Platelet  Daily,   R     Question:  Specimen collection method  Answer:  Lab=Lab collect   11/30/24 0732   12/01/24 0500  Basic metabolic panel with  GFR  Tomorrow morning,   R       Question:  Specimen collection method  Answer:  Lab=Lab collect   11/30/24 0732   11/30/24 0854  Culture, blood (Routine X 2) w Reflex to ID Panel  BLOOD CULTURE X 2,   R (with TIMED occurrences)      11/30/24 0853   11/30/24 0730  T4, free  Add-on,   AD       Question:  Specimen collection method  Answer:  Lab=Lab collect   11/30/24 0729           Antimicrobials/Microbiology: Anti-infectives (From admission, onward)    Start     Dose/Rate Route Frequency Ordered Stop   11/30/24 1000  rifaximin  (XIFAXAN ) tablet 550 mg        550 mg Oral 2 times daily 11/30/24 0914           Component Value Date/Time   SDES  07/01/2024 1033    BLOOD RIGHT ARM Performed at [facility], [address], [place], [place] [address]    SDES  07/01/2024 1033    BLOOD RIGHT HAND Performed at [facility], [address], [place], [place] [address]    SPECREQUEST  07/01/2024 1033    BOTTLES DRAWN AEROBIC ONLY Blood Culture results may not be optimal due to an inadequate volume of blood received in culture bottles Performed at [facility], [address], [place], [place] [address]    SPECREQUEST  07/01/2024 1033    BOTTLES DRAWN AEROBIC ONLY Blood  Culture results may not be optimal due to an inadequate volume of blood received in culture bottles Performed at [facility], [address] [place], [place] [address]    CULT  07/01/2024 1033    NO GROWTH 5 DAYS Performed at [facility], [address] [place], [place] [address]    CULT  07/01/2024 1033    NO GROWTH 5 DAYS Performed at [facility], [address] [place], [place] [address]    REPTSTATUS 07/06/2024 FINAL 07/01/2024 1033   REPTSTATUS 07/06/2024 FINAL 07/01/2024 1033    Procedures:    Mennie LAMY, MD [facility] 11/30/2024, 10:51 AM   "

## 2024-11-30 NOTE — Progress Notes (Signed)
" ° ° ° °  Patient Name: [name]           DOB: 17-Jul-1951  MRN: [id number]      Admission Date: 11/28/2024  Attending Provider: Christobal Guadalajara, MD  Primary Diagnosis: Hepatic encephalopathy (HCC)   Level of care: Stepdown   OVERNIGHT EVENT   Notified by bedside RN that the patient is hypothermic with a rectal temperature of 93.64F.  All other vital signs remain hemodynamically stable. She is A/O and appears nontoxic. Most recent WBC is 3.8. Urinalysis is negative.  Per chart review and nursing report, the patient has a history of hypothermia during prior hospitalizations, with baseline temperatures typically ranging from 96-964F.  The patient is currently dry, with no evidence of bowel or urinary incontinence. Environmental interventions were initiated, including increasing room temperature and providing additional warm blankets. Despite these measures, the patient's temperature has remained low at 94.72F.  Most recent TSH is within normal limits. The patient's Synthroid  dose was decreased approximately 3 weeks ago; will continue current dose. Given persistent hypothermia, will check a cortisol level as well.    Plan: Paramedic to SDU  Lavanda Horns, DNP, ACNPC- AG [facility]    "

## 2024-11-30 NOTE — Plan of Care (Signed)
" °  Problem: Clinical Measurements: Goal: Respiratory complications will improve Outcome: Progressing   Problem: Pain Managment: Goal: General experience of comfort will improve and/or be controlled Outcome: Progressing   Problem: Activity: Goal: Risk for activity intolerance will decrease Outcome: Not Progressing   Problem: Nutrition: Goal: Adequate nutrition will be maintained Outcome: Not Progressing   Problem: Elimination: Goal: Will not experience complications related to bowel motility Outcome: Not Progressing   "

## 2024-11-30 NOTE — Evaluation (Signed)
 Physical Therapy Evaluation Patient Details Name: [name] MRN: [id number] DOB: Mar 31, 1951 Today's Date: 11/30/2024  History of Present Illness  [name] is a 74 yo female admitted with altered mental status and admitted for acute hepatic encephalopathy, RR for hypothermia  PMH: Pt with two recent hospitalizations for AMS, and acute CVA May 2025,  arthritis, colon and cecum ca, hepatic encephalopathy HTN, anemia, liver dz, falls, MASH cirrhosis, variceal hemorrhage s/p TIPS, SIRS, A-fib, hypothyroidism, CVA and AKI.  Clinical Impression  PTA, patient lives at home with husband who provides 24/7 support and assist with patient set to attend her last OPPT session just prior to admission having progressed to only intermittent use of SPC and relatively mod I for simple BADL's with use of tub bench for bathing. Husband supports all IADL's due to patients baseline fluctuation in cognition and overall fatigue levels. Currently, patient presents with deficits outlined below (see PT Problem List for details) most significantly decreased balance, activity tolerance, cognition and generalized muscle weakness limiting  functional mobility (min-mod A RW for simple STS/side stepping) performance. Anticipate with husbands assist and support, OPPT recommended upon discharge from acute hospital setting.   Patient requires continued Acute care hospital level PT services to progress safety and functional performance and allow for discharge.       If plan is discharge home, recommend the following: A lot of help with walking and/or transfers;A little help with bathing/dressing/bathroom;Assistance with cooking/housework;Assist for transportation;Help with stairs or ramp for entrance   Can travel by private vehicle        Equipment Recommendations None recommended by PT  Recommendations for Other Services       Functional Status Assessment Patient has had a recent decline in their functional status and  demonstrates the ability to make significant improvements in function in a reasonable and predictable amount of time.     Precautions / Restrictions Precautions Precautions: Fall Restrictions Weight Bearing Restrictions Per Provider Order: No      Mobility  Bed Mobility Overal bed mobility: Needs Assistance Bed Mobility: Supine to Sit, Sit to Supine     Supine to sit: Min assist, Mod assist, HOB elevated, Used rails Sit to supine: Mod assist, HOB elevated, Used rails   General bed mobility comments: use of bed features, cues with increased time with basic commands, assist to scoot up to top of bed    Transfers Overall transfer level: Needs assistance Equipment used: Rolling walker (2 wheels) Transfers: Sit to/from Stand Sit to Stand: Min assist, +2 physical assistance, +2 safety/equipment, From elevated surface           General transfer comment: cues for hand placement with mild impulsivity to move STS, side stepped laterally up to [facility], bacame more lethargic with some increased RR thus layed back down    Ambulation/Gait Ambulation/Gait assistance: Min assist, +2 physical assistance, +2 safety/equipment Gait Distance (Feet): 3 Feet Assistive device: Rolling walker (2 wheels) Gait Pattern/deviations: Step-to pattern, Decreased step length - right, Decreased step length - left, Shuffle, Trunk flexed Gait velocity: decr     General Gait Details: Pt side shuflled along side of bed only - limited by fatigue  Stairs            Wheelchair Mobility     Tilt Bed    Modified Rankin (Stroke Patients Only)       Balance Overall balance assessment: Needs assistance Sitting-balance support: Feet supported Sitting balance-Leahy Scale: Fair   Postural control: Posterior lean Standing  balance support: Bilateral upper extremity supported Standing balance-Leahy Scale: Poor                               Pertinent Vitals/Pain Pain Assessment Pain  Assessment: Faces Faces Pain Scale: No hurt Pain Intervention(s): Monitored during session, Limited activity within patient's tolerance    Home Living Family/patient expects to be discharged to:: Private residence Living Arrangements: Spouse/significant other Available Help at Discharge: Family;Available 24 hours/day Type of Home: House Home Access: Ramped entrance       Home Layout: One level Home Equipment: Rolling Walker (2 wheels);BSC/3in1;Cane - single point;Wheelchair - manual;Hospital bed;Tub bench Additional Comments: walker basket    Prior Function Prior Level of Function : Needs assist  Cognitive Assist : ADLs (cognitive)   ADLs (Cognitive): Set up cues Physical Assist : Mobility (physical);ADLs (physical)     Mobility Comments: mod I with gait with intermittent use SPC for household and community navigation but husband always present in community; was just about to graduate from [employer] [employer] [facility] usign SPC only as a light support ADLs Comments: varies depending on cognition but typically pt is MOD I for BADL's except S for bathing, spouse manages transportation, higher level meal prep, medications and bills     Extremity/Trunk Assessment   Upper Extremity Assessment Upper Extremity Assessment: Generalized weakness    Lower Extremity Assessment Lower Extremity Assessment: Generalized weakness    Cervical / Trunk Assessment Cervical / Trunk Assessment: Normal  Communication   Communication Communication: Impaired Factors Affecting Communication: Difficulty expressing self;Reduced clarity of speech    Cognition Arousal: Lethargic Behavior During Therapy: Flat affect, Impulsive                             Following commands: Impaired Following commands impaired: Follows one step commands inconsistently, Follows one step commands with increased time     Cueing Cueing Techniques: Verbal cues, Gestural cues, Tactile cues, Visual cues     General  Comments      Exercises     Assessment/Plan    PT Assessment Patient needs continued PT services  PT Problem List Decreased strength;Decreased activity tolerance;Decreased balance;Decreased mobility;Decreased knowledge of use of DME;Decreased safety awareness;Decreased cognition       PT Treatment Interventions DME instruction;Gait training;Stair training;Functional mobility training;Therapeutic activities;Therapeutic exercise;Balance training;Patient/family education    PT Goals (Current goals can be found in the Care Plan section)  Acute Rehab PT Goals Patient Stated Goal: Regain IND PT Goal Formulation: With patient Time For Goal Achievement: 12/14/24 Potential to Achieve Goals: Good    Frequency Min 2X/week     Co-evaluation PT/OT/SLP Co-Evaluation/Treatment: Yes Reason for Co-Treatment: Complexity of the patient's impairments (multi-system involvement) PT goals addressed during session: Mobility/safety with mobility;Balance OT goals addressed during session: ADL's and self-care;Proper use of Adaptive equipment and DME       AM-PAC PT 6 Clicks Mobility  Outcome Measure Help needed turning from your back to your side while in a flat bed without using bedrails?: A Lot Help needed moving from lying on your back to sitting on the side of a flat bed without using bedrails?: A Lot Help needed moving to and from a bed to a chair (including a wheelchair)?: A Lot Help needed standing up from a chair using your arms (e.g., wheelchair or bedside chair)?: A Lot Help needed to walk in hospital room?: Total Help needed  climbing 3-5 steps with a railing? : Total 6 Click Score: 10    End of Session Equipment Utilized During Treatment: Gait belt Activity Tolerance: Patient tolerated treatment well Patient left: in bed;with call bell/phone within reach;with nursing/sitter in room;with family/visitor present Nurse Communication: Mobility status PT Visit Diagnosis: Difficulty in  walking, not elsewhere classified (R26.2);Muscle weakness (generalized) (M62.81)    Time: 1140-1201 PT Time Calculation (min) (ACUTE ONLY): 21 min   Charges:   PT Evaluation $PT Eval Low Complexity: 1 Low   PT General Charges $$ ACUTE PT VISIT: 1 Visit         [facility] PT Acute Rehabilitation Services Office [phone]   [name] 11/30/2024, 4:54 PM

## 2024-11-30 NOTE — Consult Note (Addendum)
 "  Referring Provider: Dr. Mennie Lamy Primary Care Physician:  [name], NP Primary Gastroenterologist:  Dr. Norleen Kiang   Reason for Consultation: Hepatic encephalopathy  HPI: [name] is a 74 y.o. 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 She developed Oxaliplatin induced portal hypertension with esophageal varices and had a variceal hemorrhage in 2012 for which she underwent TIPS at [facility]. She did not experience encephalopathy until contracting COVID-19.  She has had multiple hospital admissions with hepatic encephalopathy since 11/2023, variable precipitants including UTI, Oxycodone  use and constipation. Her most recent hospitalization 8/9 - 07/03/2024 for altered mental status and high ammonia levels. She was hypothermic, Temp 92.102F with leukocytosis. Initially received IV Rocephin  and Flagyl .  Ammonia 248 -> 83. She responded to oral Lactulose  oral and enema. Blood cultures were negative. Her husband indicated this episode of hepatic encephalopathy was possible triggered by a low TSH level which was corrected after does of Levothyroxine  was better regulated.  She presented to the ED 11/28/2024 with altered mental status. Husband reported she gradually become more lethargic over the past week and was notably more lethargic and could not walk to their tracks therefore he called 911 and she was transported to the ED for further evaluation. Labs in the ED showed a WBC count of 5.0.  Hemoglobin 10.8 which is baseline.  Hematocrit 32.8.  Platelets 120.  Sodium 139.   Potassium 5.3.  BUN 32 up from 26 one month ago.  Creatinine 1.53.  Albumin  3.3.  Total bili 1.8.  Alk phos 228.  AST 79.  ALT 49.  Ammonia 222.  TSH 1.330.  CT showed chronic small vessel disease of the white matter, chronic left PCA infarct without acute intracranial abnormality.  Chest x-ray showed cardiomegaly otherwise was unremarkable.  Labs 11/29/2024: WBC 3.8.  Hemoglobin 10.1.  Platelets 103.  Sodium 141.  BUN 31.  Creatinine 1.37.  Albumin  3.0.  Total bili 1.5.  Alk phos 196.  AST 63.  ALT 39.  INR 1.4.  Urinalysis with trace leukocytes and large quantity of hemoglobin.  MELD 3.0: 14 at 11/30/2024  3:20 AM MELD-Na: 11 at 11/30/2024  3:20 AM Calculated from: Serum Creatinine: 1.13 mg/dL at 06/23/7972  6:79 AM Serum Sodium: 144 mmol/L (Using max of 137 mmol/L) at 11/30/2024  3:20 AM Total Bilirubin: 1.1 mg/dL at 06/23/7972  6:79 AM Serum Albumin : 2.6 g/dL at 06/23/7972  6:79 AM INR(ratio): 1.4 at 11/29/2024  4:11 AM Age at listing (hypothetical): 73 years Sex: Female at 11/30/2024  3:20 AM  Labs today: WBC count 2.0.  Hemoglobin 8.5.  Hematocrit 25.4.  MCV 106.3.  Platelets 97.  UN 24.  Creatinine 1.13.  Total bili 1.1.  Alk phos 154.  AST 63.  ALT 36.  Ammonia 43.  Cortisol 10.5.  Blood cultures pending.  Husband stated she has been adherent with taking Lactulose  4 times daily and Xifaxan  twice daily at home as he administers all of her medications.  He has not recently administered a Lactulose  enema but has in the past.  Over the past week, he noticed her fluid intake decreased and she last ate solid food (a  baked potato) 3 days ago.  He stated over the past few days she has been passing very small volumes of mushy dark brown to green stool, sometimes sees a small amount of bright red blood and sometimes stools are black.  She is on oral iron at home.  Husband stated sometimes she will go 3 to 4 days without passing any stool despite taking lactulose  4 times daily.  No nausea or vomiting.  No abdominal  pain. Since admission, she has received Xifaxan  550 mg p.o. twice daily and Lactulose  30 g 3 times daily.  She also received a lactulose  enema last night without producing any stool.  BM yet today as confirmed by her dayshift RN.  Husband also noted her Levothyroxine  dose was reduced 11/06/2024 and questions if that change triggered this episode of hepatic encephalopathy.  No recent narcotic use.  She is followed by Stephane Quest NP at [facility] health liver care. Her most recent RUQ sonogram was 11/01/2024 which showed heterogeneous increased echotexture of the liver compatible with underlying hepatic disease, patent TIPS and cholelithiasis without evidence of acute cholecystitis.   Her most recent EGD was done during her hospital admission 05/28/2024 which showed a normal esophagus, a small hiatal hernia, no evidence of esophageal or gastric varices and a nonbleeding gastric ulcer with antral gastritis were identified.  Gastric biopsies were negative for H. pylori.  She was prescribed pantoprazole  40 mg twice daily for 4 weeks then once daily x 4 weeks then discontinued.  She is not a candidate for transplant given history of adenocarcinoma of the colon and advanced age. Last surveillance colonoscopy was in 2015. Paternal grandmother diagnosed with colon cancer in her 37s.    GI PROCEDURES:  EGD 05/28/2024: - Normal esophagus.  - Small hiatal hernia.  - No esophageal or gastric varices. - Non-bleeding gastric ulcers with antral gastritis Biopsied. - Normal examined duodenum.   A. STOMACH, BIOPSY:  - Chronic inactive gastritis.  - Immunohistochemical stain for H. pylori is negative.   Past Medical History:  Diagnosis Date   Acute hepatic encephalopathy (HCC) 03/30/2024   Acute urinary retention 05/04/2022   Allergy 1952   Contrast dye   Arthritis    Knees and thumb   Blood transfusion without reported diagnosis    Cancer [facility])    cecum   Cataract    Surgery scheduled July 2023   Colon  cancer (HCC) 2003   Elevated liver function tests    Esophageal varices (HCC)    Heart murmur    Hemorrhage of gastrointestinal tract 05/04/2011   Hepatic encephalopathy (HCC) 2025   Hypertension    Hypothyroidism    Iron deficiency anemia    Liver disease    chemotherapy complication, per pt, shunts placed to bypass liver   Malignant neoplasm of cecum (HCC)    Myocardial infarction [facility]) Jan 2025   Portal hypertension (HCC)    Skin cancer 2019   Splenomegaly    Stroke [facility])    Ulcer 05/2024    Past Surgical History:  Procedure Laterality Date   COLON SURGERY     Cancer   COSMETIC SURGERY     Skin cancer   ESOPHAGEAL VARICE LIGATION     ESOPHAGOGASTRODUODENOSCOPY N/A 05/28/2024   Procedure: EGD (ESOPHAGOGASTRODUODENOSCOPY);  Surgeon: Albertus Gordy HERO, MD;  Location: [facility];  Service: Gastroenterology;  Laterality: N/A;   EYE SURGERY     For cataracts   HEMICOLECTOMY  01/08/2003   IR RADIOLOGIST EVAL & MGMT  12/20/2020  IR RADIOLOGIST EVAL & MGMT  05/29/2021   LIVER SURGERY     shunts placed after chemo complication   SKIN FULL THICKNESS GRAFT N/A 09/12/2019   Procedure: debridement and FTSG to the nose from left upper arm;  Surgeon: Elisabeth Craig RAMAN, MD;  Location: [facility];  Service: Plastics;  Laterality: N/A;  2 hours, please   TIPS PROCEDURE      Prior to Admission medications  Medication Sig Start Date End Date Taking? Authorizing Provider  aspirin  EC 81 MG tablet Take 1 tablet (81 mg total) by mouth daily. Swallow whole. 12/26/23  Yes Bryn Bernardino NOVAK, MD  CRANBERRY PO Take 1 tablet by mouth 2 (two) times daily.   Yes [provider]  ENULOSE  10 GM/15ML SOLN Take 45 mLs by mouth See admin instructions. 45 ml by mouth three to four times a day depending on daily plans 07/21/24  Yes [provider]  ferrous sulfate  325 (65 FE) MG tablet Take 1 tablet (325 mg total) by mouth 2 (two) times daily with a meal. 05/28/24 11/29/24 Yes Shahmehdi,  Seyed A, MD  furosemide  (LASIX ) 20 MG tablet Take 20-40 mg by mouth daily. Depending on blood pressure   Yes [provider]  hydrocortisone  (ANUSOL -HC) 2.5 % rectal cream Place 1 Application rectally 2 (two) times daily. Patient taking differently: Place 1 Application rectally daily as needed for hemorrhoids or anal itching. 06/30/24  Yes [name], NP  levOCARNitine  (CARNITOR ) 330 MG tablet Take 330 mg by mouth 3 (three) times daily. 05/23/22  Yes [provider]  liver oil-zinc  oxide (DESITIN) 40 % ointment Apply topically daily as needed for irritation. 03/13/23  Yes Pokhrel, Laxman, MD  magnesium  oxide (MAG-OX) 400 MG tablet Take 1 tablet (400 mg total) by mouth daily. 04/05/24  Yes Rojelio Nest, DO  Multiple Vitamin (MULTI-VITAMIN) tablet Take 1 tablet by mouth in the morning.   Yes [provider]  pantoprazole  (PROTONIX ) 40 MG tablet Take 1 tablet (40 mg total) by mouth daily. 08/02/24 11/29/24 Yes Abran Norleen SAILOR, MD  polyethylene glycol (MIRALAX  / GLYCOLAX ) 17 g packet Take 17 g by mouth daily. Patient taking differently: Take 17 g by mouth daily as needed for mild constipation, moderate constipation or severe constipation. 06/10/24  Yes Rizwan, Saima, MD  Probiotic Product (PROBIOTIC PEARLS WOMENS) CAPS Take 1 capsule by mouth in the morning.   Yes [provider]  rifaximin  (XIFAXAN ) 550 MG TABS tablet Take 1 tablet (550 mg total) by mouth 2 (two) times daily. 12/25/23  Yes Bryn Bernardino NOVAK, MD  spironolactone  (ALDACTONE ) 25 MG tablet Take 50 mg by mouth daily.   Yes [provider]  SYNTHROID  50 MCG tablet Take 1 tablet (50 mcg total) by mouth daily before breakfast. 11/06/24  Yes [name], NP  gabapentin  (NEURONTIN ) 100 MG capsule Take 1 capsule (100 mg total) by mouth 2 (two) times daily. Patient not taking: Reported on 11/29/2024 09/05/24   [name], NP  potassium chloride  SA (KLOR-CON  M) 20 MEQ tablet Take 1 tablet (20 mEq total) by mouth  daily. Patient not taking: Reported on 11/29/2024 06/20/24   Darci Pore, MD  valACYclovir  (VALTREX ) 1000 MG tablet Take 1 tablet (1,000 mg total) by mouth 3 (three) times daily. Patient not taking: Reported on 11/29/2024 09/02/24   Juleen Norleen, PA-C    Current Facility-Administered Medications  Medication Dose Route Frequency Provider Last Rate Last Admin   acetaminophen  (TYLENOL ) tablet 650 mg  650 mg Oral Q6H PRN Lou Claretta HERO, MD       Or   acetaminophen  (TYLENOL ) suppository 650 mg  650 mg Rectal Q6H PRN Lou Claretta HERO, MD       bisacodyl  (DULCOLAX) EC tablet 5 mg  5 mg Oral Daily PRN Lou Claretta HERO, MD       Chlorhexidine  Gluconate Cloth 2 % PADS 6 each  6 each Topical Daily Kc, Ramesh, MD       dextrose  5 % and 0.45 % NaCl infusion   Intravenous Continuous Kc, Ramesh, MD 40 mL/hr at 11/30/24 0908 Rate Change at 11/30/24 0908   Gerhardt's butt cream   Topical BID Andrez Chroman, NP   Given at 11/30/24 0914   heparin  injection 5,000 Units  5,000 Units Subcutaneous Q8H Lou Claretta HERO, MD   5,000 Units at 11/30/24 9374   lactulose  (CHRONULAC ) 10 GM/15ML solution 30 g  30 g Oral TID Christobal Guadalajara, MD   30 g at 11/30/24 9088   levothyroxine  (SYNTHROID ) tablet 50 mcg  50 mcg Oral Q0600 Chavez, Abigail, NP   50 mcg at 11/30/24 0625   Muscle Rub CREA   Topical PRN Christobal Guadalajara, MD   1 Application at 11/29/24 1535   ondansetron  (ZOFRAN ) tablet 4 mg  4 mg Oral Q6H PRN Lou Claretta HERO, MD       Or   ondansetron  (ZOFRAN ) injection 4 mg  4 mg Intravenous Q6H PRN Amponsah, Prosper M, MD       Oral care mouth rinse  15 mL Mouth Rinse PRN Lou Claretta HERO, MD       pantoprazole  (PROTONIX ) injection 40 mg  40 mg Intravenous Q24H Kc, Ramesh, MD   40 mg at 11/30/24 0941   rifaximin  (XIFAXAN ) tablet 550 mg  550 mg Oral BID Christobal Guadalajara, MD   550 mg at 11/30/24 9062   senna-docusate (Senokot-S) tablet 1 tablet  1 tablet Oral QHS PRN Lou Claretta HERO, MD        Allergies  as of 11/28/2024 - Review Complete 11/28/2024  Allergen Reaction Noted   Contrast media [iodinated contrast media] Hives 11/30/2011    Family History  Problem Relation Age of Onset   Arthritis Mother    Hearing loss Mother    Heart disease Mother    Hypertension Mother    Miscarriages / Stillbirths Mother    Arthritis Father    Diabetes Father    Heart disease Father    Cancer Maternal Aunt    Heart disease Maternal Grandfather    Breast cancer Neg Hx    Colon cancer Neg Hx    Esophageal cancer Neg Hx    Pancreatic cancer Neg Hx    Stomach cancer Neg Hx    Stroke Neg Hx     Social History   Socioeconomic History   Marital status: Married    Spouse name: Animal Nutritionist   Number of children: 2   Years of education: Not on file   Highest education level: Some college, no degree  Occupational History   Occupation: Comptroller  Tobacco Use   Smoking status: Never   Smokeless tobacco: Never   Tobacco comments:    Never smoked  Vaping Use   Vaping status: Never Used  Substance and Sexual Activity   Alcohol  use: Never   Drug use: Never   Sexual activity: Not Currently    Partners: Male  Other Topics Concern   Not on file  Social History Narrative  12/04/20   From: the area   Living: with husband, Valere (1994)   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      Family: 2 children Corean and Optometrist - 2 grandchildren - nearby      Enjoys: read      Exercise: trying to get back to exercise   Diet: healthy, limits fast foods      Safety   Seat belts: Yes    Guns: Yes  and secure   Safe in relationships: Yes    Social Drivers of Health   Tobacco Use: Low Risk (11/28/2024)   Patient History    Smoking Tobacco Use: Never    Smokeless Tobacco Use: Never    Passive Exposure: Not on file  Financial Resource Strain: Low Risk (10/30/2024)   Overall Financial Resource Strain (CARDIA)    Difficulty of Paying Living Expenses: Not hard at all  Food Insecurity: No Food  Insecurity (10/30/2024)   Epic    Worried About Radiation Protection Practitioner of Food in the Last Year: Never true    Ran Out of Food in the Last Year: Never true  Transportation Needs: No Transportation Needs (11/28/2024)   Epic    Lack of Transportation (Medical): No    Lack of Transportation (Non-Medical): No  Physical Activity: Insufficiently Active (10/30/2024)   Exercise Vital Sign    Days of Exercise per Week: 2 days    Minutes of Exercise per Session: 20 min  Stress: No Stress Concern Present (10/30/2024)   [employer] of Occupational Health - Occupational Stress Questionnaire    Feeling of Stress: Only a little  Social Connections: Socially Integrated (11/28/2024)   Social Connection and Isolation Panel    Frequency of Communication with Friends and Family: Three times a week    Frequency of Social Gatherings with Friends and Family: Twice a week    Attends Religious Services: More than 4 times per year    Active Member of [employer] or Organizations: Yes    Attends Banker Meetings: More than 4 times per year    Marital Status: Married  Catering Manager Violence: Not At Risk (07/01/2024)   Epic    Fear of Current or Ex-Partner: No    Emotionally Abused: No    Physically Abused: No    Sexually Abused: No  Depression (PHQ2-9): Low Risk (05/11/2024)   Depression (PHQ2-9)    PHQ-2 Score: 1  Alcohol  Screen: Low Risk (03/27/2024)   Alcohol  Screen    Last Alcohol  Screening Score (AUDIT): 0  Housing: Low Risk (10/30/2024)   Epic    Unable to Pay for Housing in the Last Year: No    Number of Times Moved in the Last Year: 0    Homeless in the Last Year: No  Utilities: Not At Risk (11/28/2024)   Epic    Threatened with loss of utilities: No  Health Literacy: Not on file   Review of Systems: Limited Review of Systems as patient is lethargic at this time.  Husband providing symptom history.  She denies having any chest pain, shortness of breath or abdominal pain at this time.  See  HPI.  Physical Exam: Vital signs in last 24 hours: Temp:  [93.7 F (34.3 C)-97.7 F (36.5 C)] 97.7 F (36.5 C) (01/08 0800) Pulse Rate:  [62-83] 76 (01/08 0827) Resp:  [10-18] 12 (01/08 0827) BP: (102-144)/(44-65) 112/52 (01/08 0800) SpO2:  [96 %-100 %] 96 % (01/08 0827) Last BM Date : 11/29/24 General: Chronically ill appearing  pale complected 74 year old female. Head:  Normocephalic and atraumatic. Eyes:  No scleral icterus. Conjunctiva pink. Ears:  Normal auditory acuity. Nose:  No deformity, discharge or lesions. Mouth:  Dentition intact. No ulcers or lesions.  Neck:  Supple. No lymphadenopathy or thyromegaly.  Lungs: Breath sounds clear throughout. No wheezes, rhonchi or crackles.  On room air. Heart: Regular rate and rhythm, no murmurs. Abdomen: Soft, nondistended.  Nontender.  Positive bowel sounds all 4 quadrants.  No ascites.  No palpable mass. Rectal: Deferred. Musculoskeletal:  Symmetrical without gross deformities.  Pulses:  Normal pulses noted. Extremities: + LLE edema.  Neurologic: Patient is somnolent but arousable, answers yes or no to simple questions.  Unable to perform asterixis exam as patient too weak to hold upper extremities in extended position. Skin:  Intact without significant lesions or rashes.  Facial telangiectasias. Psych: Nonagitated.  Intake/Output from previous day: 01/07 0701 - 01/08 0700 In: 473.2 [I.V.:473.2] Out: 500 [Urine:500] Intake/Output this shift: Total I/O In: 116.1 [I.V.:116.1] Out: -   Lab Results: Recent Labs    11/28/24 1442 11/29/24 0411 11/30/24 0320  WBC 5.0 3.8* 2.0*  HGB 10.8* 10.1* 8.5*  HCT 32.8* 29.6* 25.4*  PLT 120* 103* 97*   BMET Recent Labs    11/28/24 1442 11/29/24 0411 11/30/24 0320  NA 139 141 144  K 5.3* 4.5 4.6  CL 102 109 116*  CO2 25 21* 20*  GLUCOSE 84 82 66*  BUN 32* 31* 24*  CREATININE 1.53* 1.37* 1.13*  CALCIUM  10.0 9.2 8.8*   LFT Recent Labs    11/30/24 0320  PROT 4.7*   ALBUMIN  2.6*  AST 63*  ALT 36  ALKPHOS 154*  BILITOT 1.1   PT/INR Recent Labs    11/29/24 0411  LABPROT 17.5*  INR 1.4*   Hepatitis Panel No results for input(s): HEPBSAG, HCVAB, HEPAIGM, HEPBIGM in the last 72 hours.    Studies/Results: DG Chest Portable 1 View Result Date: 11/28/2024 CLINICAL DATA:  Altered mental status EXAM: PORTABLE CHEST 1 VIEW COMPARISON:  07/01/2024 FINDINGS: Hypoventilatory changes. Cardiomegaly. No focal opacity, pleural effusion or pneumothorax. Minimal atelectasis or scar in the left lower lung. Aortic atherosclerosis. Tips in the right upper quadrant. IMPRESSION: No active disease. Cardiomegaly. Electronically Signed   By: Luke Bun M.D.   On: 11/28/2024 15:44   CT Head Wo Contrast Result Date: 11/28/2024 CLINICAL DATA:  Mental status change encephalopathy EXAM: CT HEAD WITHOUT CONTRAST TECHNIQUE: Contiguous axial images were obtained from the base of the skull through the vertex without intravenous contrast. RADIATION DOSE REDUCTION: This exam was performed according to the departmental dose-optimization program which includes automated exposure control, adjustment of the mA and/or kV according to patient size and/or use of iterative reconstruction technique. COMPARISON:  CT 06/19/2024, MRI 05/24/2024 FINDINGS: Brain: No acute territorial infarction, hemorrhage or intracranial mass. Small chronic left occipital and cerebellar infarcts. Chronic lacunar infarct right thalamus. Scattered white matter hypodensity consistent with chronic small vessel disease. No ventricular enlargement. Vascular: No hyperdense vessels.  Carotid vascular calcification Skull: No fracture Sinuses/Orbits: No acute finding. Other: None IMPRESSION: 1. No CT evidence for acute intracranial abnormality. 2. Chronic small vessel disease of the white matter. Chronic left PCA infarct Electronically Signed   By: Luke Bun M.D.   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  CT without acute intracranial abnormality.  Ammonia 222 -> 43. Total bili 1.1.  Alk phos 154.  AST 63.  ALT 36. - Continue Lactulose  30 g 3 times daily, titrate to no more than 3-4 loose bowel movements daily, switch to lactulose  enemas if patient unable to take p.o. - Continue Xifaxan  550 mg twice daily - IV fluids per the hospitalist - Clear liquid diet, advance diet when patient more alert - Await blood culture result - ? CTAP to assess for intra-abdominal/pelvic pathology which may be contributing to recurrent HE - Monitor neurostatus closely - Await further recommendations per Dr.Ardelle Haliburton   Macrocytic anemia. No overt GI bleeding. Hg 10.8 -> 10.1 -> 8.5.  - Transfuse for hemoglobin level less than 7.5  - CBC in am  Leukopenia, WBC 3.8 -> 2.0.  Thrombocytopenia. PLT 97.    Embolic CVA 11/2023 on ASA 81 mg daily   History of GERD, gastric ulcer. EGD 05/28/2024 which showed a normal esophagus, a small hiatal hernia, no evidence of esophageal or gastric varices and a nonbleeding gastric ulcer with antral gastritis were identified. Gastric biopsies were negative for H. pylori. Remains on ASA 81 mg daily. 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  Patient seen and examined as outlined above.  Patient noted to be for short period of time after reestablishing care with our group.  Has had issues with recurrent hepatic encephalopathy.  It seems that she can go several days without bowel movements.   No obvious precipitating factors.  Suspect will need to be more aggressive with lactulose  therapy.  Goal is to achieve 3-5 bowel movements per day.  Will follow.  Norleen SAILOR. Abran Raddle., M.D. [facility] Division of Gastroenterology    "

## 2024-11-30 NOTE — Progress Notes (Signed)
CBG re-check 81 

## 2024-11-30 NOTE — Evaluation (Signed)
 Occupational Therapy Evaluation Patient Details Name: [name] MRN: [id number] DOB: 02/01/1951 Today's Date: 11/30/2024   History of Present Illness   [name] is a 74 yo female admitted with altered mental status and admitted for acute hepatic encephalopathy, RR for hypothermia  PMH: Pt with two recent hospitalizations for AMS, and acute CVA May 2025,  arthritis, colon and cecum ca, hepatic encephalopathy HTN, anemia, liver dz, falls, MASH cirrhosis, variceal hemorrhage s/p TIPS, SIRS, A-fib, hypothyroidism, CVA and AKI.     Clinical Impressions PTA, patient lives at home with husband who provides 24/7 support and assist with patient set to attend her last OPPT session just prior to admission having progressed to only intermittent use of SPC and relatively mod I for simple BADL's with use of tub bench for bathing. Husband supports all IADL's due to patients baseline fluctuation in cognition and overall fatigue levels. Currently, patient presents with deficits outlined below (see OT Problem List for details) most significantly decreased balance, activity tolerance, cognition and generalized muscle weakness limiting BADL's (max-total A for LB) and functional mobility (min-mod A RW for simple STS/side stepping) performance. Anticipate with husbands assist and support, OPOT recommended upon discharge from acute hospital setting.   Patient requires continued Acute care hospital level OT services to progress safety and functional performance and allow for discharge.     If plan is discharge home, recommend the following:   Two people to help with walking and/or transfers;A lot of help with bathing/dressing/bathroom;Assistance with cooking/housework;Assistance with feeding;Direct supervision/assist for medications management;Direct supervision/assist for financial management;Assist for transportation;Help with stairs or ramp for entrance;Supervision due to cognitive status     Functional  Status Assessment   Patient has had a recent decline in their functional status and demonstrates the ability to make significant improvements in function in a reasonable and predictable amount of time.     Equipment Recommendations   None recommended by OT      Precautions/Restrictions   Precautions Precautions: Fall Restrictions Weight Bearing Restrictions Per Provider Order: No     Mobility Bed Mobility Overal bed mobility: Needs Assistance Bed Mobility: Supine to Sit, Sit to Supine     Supine to sit: Min assist, Mod assist, HOB elevated, Used rails Sit to supine: Mod assist, HOB elevated, Used rails   General bed mobility comments: use of bed features, cues with increased time with basic commands, assist to scoot up to top of bed    Transfers Overall transfer level: Needs assistance Equipment used: Rolling walker (2 wheels) Transfers: Sit to/from Stand Sit to Stand: Min assist, +2 physical assistance, +2 safety/equipment, From elevated surface           General transfer comment: cues for hand placement with mild impulsivity to move STS, side stepped laterally up to [facility], bacame more lethargic with some increased RR thus layed back down      Balance Overall balance assessment: Needs assistance Sitting-balance support: Feet supported Sitting balance-Leahy Scale: Fair   Postural control: Posterior lean Standing balance support: Bilateral upper extremity supported Standing balance-Leahy Scale: Poor                             ADL either performed or assessed with clinical judgement   ADL Overall ADL's : Needs assistance/impaired Eating/Feeding: Minimal assistance   Grooming: Wash/dry hands;Wash/dry face;Bed level;Contact guard assist   Upper Body Bathing: Moderate assistance;Bed level   Lower Body Bathing: Total assistance;Bed level  Upper Body Dressing : Maximal assistance;Bed level   Lower Body Dressing: Total assistance;Bed  level Lower Body Dressing Details (indicate cue type and reason): due to lethargy and lines Toilet Transfer:  (anticipate min A to commode but only able to side step up the bed)   Toileting- Clothing Manipulation and Hygiene: Total assistance;Bed level       Functional mobility during ADLs: Minimal assistance;Moderate assistance;Rolling walker (2 wheels) (limited) General ADL Comments: decreased activity tolerance     Vision Baseline Vision/History: 0 No visual deficits              Pertinent Vitals/Pain Pain Assessment Pain Assessment: Faces Faces Pain Scale: No hurt Breathing: occasional labored breathing, short period of hyperventilation Negative Vocalization: none Facial Expression: facial grimacing Body Language: tense, distressed pacing, fidgeting Consolability: no need to console PAINAD Score: 4 Pain Intervention(s): Monitored during session     Extremity/Trunk Assessment Upper Extremity Assessment Upper Extremity Assessment: Generalized weakness;Right hand dominant   Lower Extremity Assessment Lower Extremity Assessment: Defer to PT evaluation   Cervical / Trunk Assessment Cervical / Trunk Assessment: Normal   Communication Communication Communication: Impaired Factors Affecting Communication: Difficulty expressing self;Reduced clarity of speech   Cognition Arousal: Lethargic Behavior During Therapy: Flat affect, Impulsive Cognition: History of cognitive impairments             OT - Cognition Comments: fluctuates with encephalopathy/chronic illness state, oriented to self and place but unable to accurately state time/situation. decreased STM, insight, problem solving and safety with mild impulsivity and significant slower processing                 Following commands: Impaired Following commands impaired: Follows one step commands inconsistently, Follows one step commands with increased time     Cueing  General Comments   Cueing  Techniques: Verbal cues;Gestural cues;Tactile cues;Visual cues  VSS, increased RR when standing and laterally stepping, on RA with SpO2 remaining 94-96%, multiple bruises and skin irritations noted over arms and IV sites           Home Living Family/patient expects to be discharged to:: Private residence Living Arrangements: Spouse/significant other Available Help at Discharge: Family;Available 24 hours/day Type of Home: House Home Access: Ramped entrance     Home Layout: One level     Bathroom Shower/Tub: Tub/shower unit;Curtain   Firefighter: Standard Bathroom Accessibility: Yes How Accessible: Accessible via walker Home Equipment: Rolling Walker (2 wheels);BSC/3in1;Cane - single point;Wheelchair - manual;Hospital bed;Tub bench   Additional Comments: walker basket      Prior Functioning/Environment Prior Level of Function : Needs assist             Mobility Comments: mod I with gait with intermittent use SPC for household and community navigation but husband always present in community; was just about to graduate from [employer] usign SPC only as a light support ADLs Comments: varies depending on cognition but typically pt is MOD I for BADL's except S for bathing, spouse manages transportation, higher level meal prep, medications and bills    OT Problem List: Decreased strength;Decreased activity tolerance;Impaired balance (sitting and/or standing);Decreased cognition;Cardiopulmonary status limiting activity   OT Treatment/Interventions: Self-care/ADL training;Therapeutic exercise;Neuromuscular education;Energy conservation;DME and/or AE instruction;Therapeutic activities;Cognitive remediation/compensation;Patient/family education;Balance training      OT Goals(Current goals can be found in the care plan section)   Acute Rehab OT Goals Patient Stated Goal: to keep feeling better daily OT Goal Formulation: With patient/family Time For Goal Achievement:  12/14/24 Potential to  Achieve Goals: Fair ADL Goals Pt Will Perform Lower Body Bathing: with supervision Pt Will Perform Lower Body Dressing: with supervision;sit to/from stand Pt Will Transfer to Toilet: with supervision;regular height toilet;ambulating Pt Will Perform Toileting - Clothing Manipulation and hygiene: with supervision;sit to/from stand Pt Will Perform Tub/Shower Transfer: with contact guard assist;rolling walker;Shower transfer;shower Insurance Risk Surveyor will Perform Home Exercise Program: Increased strength;Both right and left upper extremity;With Supervision;With written HEP provided   OT Frequency:  Min 2X/week    Co-evaluation   Reason for Co-Treatment: Complexity of the patient's impairments (multi-system involvement) PT goals addressed during session: Mobility/safety with mobility;Balance OT goals addressed during session: ADL's and self-care;Proper use of Adaptive equipment and DME      AM-PAC OT 6 Clicks Daily Activity     Outcome Measure Help from another person eating meals?: A Lot Help from another person taking care of personal grooming?: A Lot Help from another person toileting, which includes using toliet, bedpan, or urinal?: Total Help from another person bathing (including washing, rinsing, drying)?: A Lot Help from another person to put on and taking off regular upper body clothing?: A Lot Help from another person to put on and taking off regular lower body clothing?: A Lot 6 Click Score: 11   End of Session Equipment Utilized During Treatment: Gait belt;Rolling walker (2 wheels) Nurse Communication: Mobility status  Activity Tolerance: Patient limited by fatigue;Patient limited by lethargy Patient left: in bed;with call bell/phone within reach;with bed alarm set;with nursing/sitter in room;with family/visitor present  OT Visit Diagnosis: Unsteadiness on feet (R26.81);Muscle weakness (generalized) (M62.81);Cognitive communication deficit (R41.841)                 Time: 8864-8844 OT Time Calculation (min): 20 min Charges:  OT General Charges $OT Visit: 1 Visit OT Evaluation $OT Eval Low Complexity: 1 Low  Imari Reen OT/L Acute Rehabilitation Department  [phone]  11/30/2024, 12:23 PM

## 2024-11-30 NOTE — Progress Notes (Signed)
 Patients CBG 61. Initiated hypoglycemic protocol. RN gave 237ml of regular ginger ale. Will recheck.

## 2024-12-01 DIAGNOSIS — N179 Acute kidney failure, unspecified: Secondary | ICD-10-CM

## 2024-12-01 DIAGNOSIS — D539 Nutritional anemia, unspecified: Secondary | ICD-10-CM | POA: Diagnosis not present

## 2024-12-01 DIAGNOSIS — K7682 Hepatic encephalopathy: Secondary | ICD-10-CM | POA: Diagnosis not present

## 2024-12-01 DIAGNOSIS — D689 Coagulation defect, unspecified: Secondary | ICD-10-CM

## 2024-12-01 DIAGNOSIS — K7581 Nonalcoholic steatohepatitis (NASH): Secondary | ICD-10-CM | POA: Diagnosis not present

## 2024-12-01 DIAGNOSIS — K766 Portal hypertension: Secondary | ICD-10-CM | POA: Diagnosis not present

## 2024-12-01 DIAGNOSIS — K7469 Other cirrhosis of liver: Secondary | ICD-10-CM | POA: Diagnosis not present

## 2024-12-01 LAB — CBC WITH DIFFERENTIAL/PLATELET
Abs Immature Granulocytes: 0.01 K/uL (ref 0.00–0.07)
Basophils Absolute: 0 K/uL (ref 0.0–0.1)
Basophils Relative: 1 %
Eosinophils Absolute: 0.1 K/uL (ref 0.0–0.5)
Eosinophils Relative: 4 %
HCT: 26.2 % — ABNORMAL LOW (ref 36.0–46.0)
Hemoglobin: 8.5 g/dL — ABNORMAL LOW (ref 12.0–15.0)
Immature Granulocytes: 0 %
Lymphocytes Relative: 29 %
Lymphs Abs: 0.9 K/uL (ref 0.7–4.0)
MCH: 35 pg — ABNORMAL HIGH (ref 26.0–34.0)
MCHC: 32.4 g/dL (ref 30.0–36.0)
MCV: 107.8 fL — ABNORMAL HIGH (ref 80.0–100.0)
Monocytes Absolute: 0.3 K/uL (ref 0.1–1.0)
Monocytes Relative: 11 %
Neutro Abs: 1.7 K/uL (ref 1.7–7.7)
Neutrophils Relative %: 55 %
Platelets: 113 K/uL — ABNORMAL LOW (ref 150–400)
RBC: 2.43 MIL/uL — ABNORMAL LOW (ref 3.87–5.11)
RDW: 19.6 % — ABNORMAL HIGH (ref 11.5–15.5)
WBC: 3.1 K/uL — ABNORMAL LOW (ref 4.0–10.5)
nRBC: 0 % (ref 0.0–0.2)

## 2024-12-01 LAB — GLUCOSE, CAPILLARY: Glucose-Capillary: 123 mg/dL — ABNORMAL HIGH (ref 70–99)

## 2024-12-01 LAB — BASIC METABOLIC PANEL WITH GFR
Anion gap: 9 (ref 5–15)
BUN: 19 mg/dL (ref 8–23)
CO2: 17 mmol/L — ABNORMAL LOW (ref 22–32)
Calcium: 8.9 mg/dL (ref 8.9–10.3)
Chloride: 114 mmol/L — ABNORMAL HIGH (ref 98–111)
Creatinine, Ser: 1.16 mg/dL — ABNORMAL HIGH (ref 0.44–1.00)
GFR, Estimated: 50 mL/min — ABNORMAL LOW
Glucose, Bld: 86 mg/dL (ref 70–99)
Potassium: 4 mmol/L (ref 3.5–5.1)
Sodium: 140 mmol/L (ref 135–145)

## 2024-12-01 LAB — PROTIME-INR
INR: 1.4 — ABNORMAL HIGH (ref 0.8–1.2)
Prothrombin Time: 18 s — ABNORMAL HIGH (ref 11.4–15.2)

## 2024-12-01 LAB — AMMONIA: Ammonia: 32 umol/L (ref 9–35)

## 2024-12-01 MED ORDER — LACTULOSE ENEMA
300.0000 mL | Freq: Every day | ORAL | Status: DC | PRN
  Administered 2024-12-01: 300 mL via RECTAL
  Filled 2024-12-01 (×2): qty 300

## 2024-12-01 MED ORDER — LACTULOSE 10 GM/15ML PO SOLN
30.0000 g | Freq: Four times a day (QID) | ORAL | Status: DC
  Administered 2024-12-01: 30 g via ORAL
  Filled 2024-12-01 (×2): qty 45

## 2024-12-01 MED ORDER — SODIUM CHLORIDE 0.9 % IV SOLN: INTRAVENOUS | Status: DC

## 2024-12-01 MED ORDER — SODIUM CHLORIDE 0.9 % IV BOLUS
1000.0000 mL | Freq: Once | INTRAVENOUS | Status: AC
  Administered 2024-12-01: 1000 mL via INTRAVENOUS

## 2024-12-01 NOTE — Progress Notes (Signed)
 Patient having auditory and visual hallucinations. She states there are people in the room and hallway who will not get out and she has asked them a few times. She says they are making music but she cannot describe what kind.

## 2024-12-01 NOTE — Progress Notes (Signed)
 " PROGRESS NOTE [name]  FMW:[id number] DOB: 11-08-1951 DOA: 11/28/2024 PCP: [name] HERO, NP  Brief Narrative/Hospital Course: [name] is a 74 y.o. female with PMH of  cirrhosis 2/2 chemotherapy s/p TIPS c/b hepatic encephalopathy, colon cancer s/p hemicolectomy, CVA, IDA, hypothyroidism, HTN and gastritis who presented to the ED for evaluation of altered mental status. As per spouse, patient is prescribed 3 teaspoons of lactulose  4 times a day however he has been doing 3 times a day with good 2-3 bowel movements a day. Synthroid  dose was reduced from 75 to 50 mcg 3 weeks ago.  Over the last week, he has noticed patient has had decreased bowel movement on her current lactulose  dose. Over the last few days, she has had mild tremor and has become more sleepy but has been able to have conversations as usual  but on 1/6 am patient w/ significant amount of stool and altered more than her baseline so he presented her to the ED for further evaluation.  ED Course: Initial vitals show patient afebrile and normotensive. Initial labs significant for K+ 5.3, BUNs/creatinine 32/1.53, AST/ALT 79/49, alk phos 228, ammonia 222, WBC 5.0, Hgb 10.8, platelet 120, glucose 84. CXR shows cardiomegaly but no active disease. CT head with no acute intracranial abnormality.  Patient given IV fluids and admitted w/ lactulose  enema.  Subjective: Seen and examined Husband sleeping at the bedside-feels she is able to put more words and mentation improving Patient is alert awake oriented to place people but not to date Overnight temperature overall stable, vital stable on room air Labs with mild metabolic acidosis, ongoing pancytopenia Decreasing urine output and BM.  Had 3 BM yesterday  Assessment and plan:  Acute hepatic encephalopathy Liver cirrhosis secondary to chemo status post TIPS History of esophageal varices and portal hypertension: Patient presenting with altered mental status tremor and decreasing  bowel movement despite being on lactulose  No other obvious trigger for HE.In the ED confused CT head no acute finding ammonia level 222> now at 43,afebrile WBC stable, UA unremarkable Blood culture sent 1/8 NGTD.  Mentation nicely improving still with asterixis GI input appreciated.  Initially on lactulose  enema and changed to p.o. 1/8- cont w/ goals of 3-4 BM/day, cont rifaximin .   Changed diet to soft diet, wean off IV fluids. Continue PT OT Eval   Hypothermia:resolving Transferred to stepdown 1/7 overnight for [employer]No obvious signs of infection on labs leukopenia.  Also anemia and hyperglycemia Cortisol stable 10.5. she has had recurrent issues w/ hypothermia on chart review back in July and August.  Suspected her cirrhosis, poor oral intake hypoglycemia contributing  Hypoglycemia: Advance diet.  Wean off IV fluids.    GERD. Cont ppi  Hypothyroidism Synthroid  dose reduced from 75 mcg to 50 mcg in mid December per spouse- Repeat TSH improved to 1.33. check ft4, cont synthroid  iv or po  AKI Mild metabolic acidosis Hyperkalemia-resolved Hyperchloremia: Creatinine improved 1.1, monitor electrolytes wean off IV fluids  Pancytopenia: Suspect in the setting of liver cirrhosis. Trend labs   CVA hx HTN: BP stable. Po meds on hold pending improvement in mental status   GERD: Continue PPI  GOC: Patient has significant medical comorbidities at high risk for recurrent admission, decompensation, consult PMT. She is full code   Mobility: PT Orders: Active PT Follow up Rec: Outpatient Pt (Pt/Spouse Preference)11/30/2024 1600    DVT prophylaxis: heparin  injection 5,000 Units Start: 11/28/24 1730 Code Status:   Code Status: Full Code Family Communication: plan of  care discussed with patient at bedside.Discussed with the husband  Patient status is: Remains hospitalized because of severity of illness Level of care: Stepdown   Dispo: The patient is from: Home with husband             Anticipated disposition: TBD Objective: Vitals last 24 hrs: Vitals:   12/01/24 0401 12/01/24 0500 12/01/24 0600 12/01/24 0700  BP:  (!) 143/55 (!) 137/52   Pulse:  81 82   Resp:  13 12   Temp: (!) 97.5 F (36.4 C)   (!) 97.5 F (36.4 C)  TempSrc: Oral   Oral  SpO2:  95% 95%   Weight:      Height:        Physical Examination: General exam: AAOX2-3 HEENT:Oral mucosa moist, Ear/Nose WNL grossly Respiratory system: Bilaterally clear BS Cardiovascular system: S1 & S2 +, No JVD. Gastrointestinal system: Abdomen soft,NT,ND, BS+ Nervous System: Alert, awake, moving  arms, and  extremities, asterixis present on hands Extremities: extremities warm, leg edema trace Skin: Warm, no rashes MSK: Normal muscle bulk,tone, power   Medications reviewed:  Scheduled Meds:  Chlorhexidine  Gluconate Cloth  6 each Topical Daily   feeding supplement  1 Container Oral TID BM   Gerhardt's butt cream   Topical BID   heparin   5,000 Units Subcutaneous Q8H   lactulose   30 g Oral TID   levothyroxine   50 mcg Oral Q0600   pantoprazole  (PROTONIX ) IV  40 mg Intravenous Q24H   rifaximin   550 mg Oral BID   Continuous Infusions:   Diet: Diet Order             DIET SOFT Room service appropriate? Yes; Fluid consistency: Thin  Diet effective now                    Data Reviewed: I have personally reviewed following labs and imaging studies ( see epic result tab) CBC: Recent Labs  Lab 11/28/24 1442 11/29/24 0411 11/30/24 0320 12/01/24 0324  WBC 5.0 3.8* 2.0* 3.1*  NEUTROABS 3.7  --   --  1.7  HGB 10.8* 10.1* 8.5* 8.5*  HCT 32.8* 29.6* 25.4* 26.2*  MCV 104.8* 104.2* 106.3* 107.8*  PLT 120* 103* 97* 113*   CMP: Recent Labs  Lab 11/28/24 1442 11/29/24 0411 11/30/24 0320 12/01/24 0324  NA 139 141 144 140  K 5.3* 4.5 4.6 4.0  CL 102 109 116* 114*  CO2 25 21* 20* 17*  GLUCOSE 84 82 66* 86  BUN 32* 31* 24* 19  CREATININE 1.53* 1.37* 1.13* 1.16*  CALCIUM  10.0 9.2 8.8* 8.9   GFR:  Estimated Creatinine Clearance: 37.3 mL/min (A) (by C-G formula based on SCr of 1.16 mg/dL (H)). Recent Labs  Lab 11/28/24 1442 11/29/24 0411 11/30/24 0320  AST 79* 63* 63*  ALT 49* 39 36  ALKPHOS 228* 196* 154*  BILITOT 1.8* 1.5* 1.1  PROT 6.6 5.8* 4.7*  ALBUMIN  3.3* 3.0* 2.6*   No results for input(s): LIPASE, AMYLASE in the last 168 hours.  Recent Labs  Lab 11/28/24 1442 11/30/24 0320 12/01/24 0324  AMMONIA 222* 43* 32   Coagulation Profile:  Recent Labs  Lab 11/29/24 0411 12/01/24 0324  INR 1.4* 1.4*   Unresulted Labs (From admission, onward)     Start     Ordered   12/01/24 0500  CBC with Differential/Platelet  Daily,   R     Question:  Specimen collection method  Answer:  Lab=Lab collect   11/30/24 0732  Antimicrobials/Microbiology: Anti-infectives (From admission, onward)    Start     Dose/Rate Route Frequency Ordered Stop   11/30/24 1000  rifaximin  (XIFAXAN ) tablet 550 mg        550 mg Oral 2 times daily 11/30/24 0914           Component Value Date/Time   SDES  11/30/2024 9057    BLOOD LEFT HAND Performed at [facility], [address] [place], [place] [address]    SPECREQUEST  11/30/2024 [phone]    BOTTLES DRAWN AEROBIC ONLY Blood Culture results may not be optimal due to an inadequate volume of blood received in culture bottles Performed at [facility], [address] [place], [place] [address]    CULT PENDING 11/30/2024 9057   REPTSTATUS PENDING 11/30/2024 9057    Procedures:   Mennie LAMY, MD [facility] 12/01/2024, 8:36 AM   "

## 2024-12-01 NOTE — Progress Notes (Addendum)
 "    [facility] Progress Note  CC:   Hepatic encephalopathy   Subjective: Night shift RN reported patient had auditory and visual hallucinations overnight, however, patient is significantly more awake today, alert and conversant at this time. She is hungry and would like to eat.  She denies having any nausea or vomiting.  No abdominal pain.  No chest pain or shortness of breath.  Per her RN, last BM was on 1/7 as documented in flow sheets. Husband who stays with patient around the clock, stated wife did not pass any stool yesterday, overnight or thus far this morning despite taking Lactulose  3 times daily.  Objective:  Vital signs in last 24 hours: Temp:  [97.5 F (36.4 C)-98.1 F (36.7 C)] 97.5 F (36.4 C) (01/09 0700) Pulse Rate:  [64-94] 82 (01/09 0600) Resp:  [11-28] 12 (01/09 0600) BP: (92-143)/(33-57) 137/52 (01/09 0600) SpO2:  [94 %-97 %] 95 % (01/09 0600) Last BM Date : 11/29/24 General: Chronically ill appearing 74 year old female in no acute distress. Conversant. Heart: Regular and rhythm, soft systolic murmur. Pulm: Breath sounds clear throughout.  On room air. Abdomen: Soft, nondistended.  Nontender.  No ascites.  No palpable mass.  Positive bowel sounds to all 4 quadrants.  No palpable hepatosplenomegaly. Extremities: Mild bilateral lower extremity edema, LLE >> RLE.  Neurologic:  Alert and oriented to name, person, place and date.  Speech is clear.  She moves all extremities weakly.  Asterixis present. Psych:  Alert and cooperative. Normal mood and affect.  Intake/Output from previous day: 01/08 0701 - 01/09 0700 In: 1046.1 [I.V.:1046.1] Out: -  Intake/Output this shift: No intake/output data recorded.  Lab Results: Recent Labs    11/29/24 0411 11/30/24 0320 12/01/24 0324  WBC 3.8* 2.0* 3.1*  HGB 10.1* 8.5* 8.5*  HCT 29.6* 25.4* 26.2*  PLT 103* 97* 113*   BMET Recent Labs    11/29/24 0411 11/30/24 0320 12/01/24 0324  NA 141 144 140  K  4.5 4.6 4.0  CL 109 116* 114*  CO2 21* 20* 17*  GLUCOSE 82 66* 86  BUN 31* 24* 19  CREATININE 1.37* 1.13* 1.16*  CALCIUM  9.2 8.8* 8.9   LFT Recent Labs    11/30/24 0320  PROT 4.7*  ALBUMIN  2.6*  AST 63*  ALT 36  ALKPHOS 154*  BILITOT 1.1   PT/INR Recent Labs    11/29/24 0411 12/01/24 0324  LABPROT 17.5* 18.0*  INR 1.4* 1.4*   Hepatitis Panel No results for input(s): HEPBSAG, HCVAB, HEPAIGM, HEPBIGM in the last 72 hours.  No results found.  Assessment / Plan:  74 year old female with Oxiplatin-induced non-cirrhotic portal hypertension + suspected MASH cirrhosis, esophageal varices with variceal hemorrhage 2012 s/p TIPS, thrombocytopenia, hepatic encephalopathy and adrenal insufficiency with multiple hospital admissions with recurrent hepatic encephalopathy.  CT without acute intracranial abnormality.  Ammonia 222 -> 43 -> 32. Total bili 1.1.  Alk phos 154.  AST 63.  ALT 36.  Blood cultures no growth < 24 hrs. Prior outpatient RUQ sono 11/01/2024 showed a patents TIPS.  She is more alert and conversant this morning although has asterixis on exam.  Temp 97.5 F. Hemodynamically stable.  - Continue Lactulose  30 g 3 times daily, titrate to no more than 3-4 loose bowel movements daily, switch to lactulose  enemas if patient unable to take p.o. - Nursing staff to document bowel movements every shift - Continue Xifaxan  550 mg twice daily - IV fluids per the hospitalist -Soft diet, advance to  2 g low-sodium diet as tolerated - Await blood culture result - Monitor neurostatus closely - Await further recommendations per Dr.Delno Blaisdell    Macrocytic anemia. No overt GI bleeding. Hg 10.8 -> 10.1 -> 8.5 -> today Hg 8.5.  - Transfuse for hemoglobin level less than 7.5  - CBC in am   Leukopenia, WBC 3.8 -> 2.0 -> 3.1.   Thrombocytopenia. PLT 97 -> 113.   Coagulopathy secondary to cirrhosis. INR 1.4.   AKI, Cr 1.53 -> 1.37 -> 1.13 -> 1.16   Embolic CVA 11/2023 on ASA 81 mg  daily   History of GERD, gastric ulcer. EGD 05/28/2024 which showed a normal esophagus, a small hiatal hernia, no evidence of esophageal or gastric varices and a nonbleeding gastric ulcer with antral gastritis were identified. Gastric biopsies were negative for H. pylori. Remains on ASA 81 mg daily. 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  Patient seen and examined as documented above.  Agree with interval progress note as outlined.  More alert today.  Lactulose  should be titrated to bowel movements.  Thus, if not having 3-5 bowel movements per day, increase the lactulose  dosage.  Norleen SAILOR. Abran Raddle., M.D. [facility] Division of Gastroenterology  "

## 2024-12-01 NOTE — Consult Note (Signed)
 "                                                                                   Consultation Note Date: 12/01/2024   Patient Name: [name]  DOB: 1951-01-06  MRN: [id number]  Age / Sex: 74 y.o., female  PCP: [name], NP Referring Physician: Christobal Guadalajara, MD  Reason for Consultation: Establishing goals of care  HPI/Patient Profile: 74 y.o. female  admitted on 11/28/2024    Clinical Assessment and Goals of Care: 74 year old lady with foxy platinum induced noncirrhotic portal hypertension, suspected cirrhosis, history of esophageal varices with variceal hemorrhage status post TIPS procedure Patient with thrombocytopenia hepatic encephalopathy and adrenal insufficiency requiring multiple hospital admissions and resulting in recurrent hepatic encephalopathy episodes Patient admitted to hospital medicine service GI consulted and following Palliative care for CODE STATUS and ongoing goals of care discussions has been requested Palliative medicine is specialized medical care for people living with serious illness. It focuses on providing relief from the symptoms and stress of a serious illness. The goal is to improve quality of life for both the patient and the family. Goals of care: Broad aims of medical therapy in relation to the patient's values and preferences. Our aim is to provide medical care aimed at enabling patients to achieve the goals that matter most to them, given the circumstances of their particular medical situation and their constraints.  Patient has also been followed by physical therapy colleagues.  Patient's husband provides 24/7 support and assist the patient with mobility.  She was supposed to attend her last outpatient physical therapy session just prior to this admission.   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 Course notable for marked hyperammonemia (222 ? 43 ? 32) with clinical improvement but persistent asterixis. Imaging and infectious workup unrevealing. Multiple prior hospitalizations reflect progressive disease burden and declining physiologic reserve.  Palliative Medicine consulted for complex medical decision-making, goals of care, and longitudinal support.  1.Recurrent Hepatic Encephalopathy  Improving mental status with ammonia down-trending; still with asterixis on exam.  CT head without acute abnormality.  No clear infectious or intracranial trigger identified.  High risk for recurrence given TIPS physiology, underlying liver disease, and prior admissions. Plan:  Continue standard HE management per primary team (lactulose   rifaximin ).  Emphasize caregiver education regarding early signs of HE and need for prompt intervention.  Discuss realistic expectations around recurrence and progressive nature of disease.  2. Chronic Liver Disease with Portal Hypertension sp TIPS  History of variceal hemorrhage; TIPS patent on outpatient RUQ ultrasound (11/01/2024).  Mild transaminitis and cholestasis; bilirubin relatively preserved.  Thrombocytopenia likely secondary to portal hypertension. Plan:  Reinforce coordination with hepatology for longitudinal management.   3. Functional Decline and Recurrent Hospitalizations  Multiple admissions for HE reflecting frailty and declining functional reserve. Plan:  Recommend home-based PT/OT on discharge to support safety, mobility, and caregiver burden  reduction.  4.Goals of Care / Advance Care Planning  Patient more alert and able to participate today.  No documented advance directives on file at  present. Plan:  Code Status: Full Code at this time, per today's discussion with patient and husband at bedside,continue to reassess as disease trajectory evolves.  Initiate values-based discussion focused on:  Understanding of illness and prognosis  Acceptable quality of life  Preferences in the event of future severe HE or inability to interact meaningfully  Spiritual Care consult placed to assist with advance care planning document preparation (HCPOA, living will) and values clarification.  Palliative team to continue longitudinal conversations as cognition allows.  5. Palliative Care Support and Disposition Planning  Patient meets criteria for ongoing outpatient palliative involvement given symptom burden, recurrent admissions, and complex decision-making needs. Plan:  Recommend home-based palliative care referral on discharge for:  Symptom management  Goals-of-care continuity  Caregiver support  Early identification of decompensation  Thank you for the consult.    Code Status/Advance Care Planning: Full code   Symptom Management:     Palliative Prophylaxis:  Frequent Pain Assessment  Additional Recommendations (Limitations, Scope, Preferences): Full Scope Treatment  Psycho-social/Spiritual:  Desire for further Chaplaincy support:yes Additional Recommendations: Caregiving  Support/Resources  Prognosis:  Unable to determine  Discharge Planning: To Be Determined      Primary Diagnoses: Present on Admission:  Hepatic encephalopathy (HCC)   I have reviewed the medical record, interviewed the patient and family, and examined the patient. The following aspects are pertinent.  Past Medical History:  Diagnosis Date   Acute hepatic encephalopathy (HCC) 03/30/2024   Acute urinary retention 05/04/2022   Allergy 1952   Contrast dye   Arthritis    Knees and thumb   Blood transfusion without reported diagnosis    Cancer [facility])    cecum   Cataract     Surgery scheduled July 2023   Colon cancer (HCC) 2003   Elevated liver function tests    Esophageal varices (HCC)    Heart murmur    Hemorrhage of gastrointestinal tract 05/04/2011   Hepatic encephalopathy (HCC) 2025   Hypertension    Hypothyroidism    Iron deficiency anemia    Liver disease    chemotherapy complication, per pt, shunts placed to bypass liver   Malignant neoplasm of cecum (HCC)    Myocardial infarction (HCC) Jan 2025   Portal hypertension (HCC)    Skin cancer 2019   Splenomegaly    Stroke [facility])    Ulcer 05/2024   Social History   Socioeconomic History   Marital status: Married    Spouse name: Animal Nutritionist   Number of children: 2   Years of education: Not on file   Highest education level: Some college, no degree  Occupational History   Occupation: Comptroller  Tobacco Use   Smoking status: Never   Smokeless tobacco: Never   Tobacco comments:    Never smoked  Vaping Use   Vaping status: Never Used  Substance and Sexual Activity   Alcohol  use: Never   Drug use: Never   Sexual activity: Not Currently    Partners: Male  Other Topics Concern   Not on file  Social History Narrative   12/04/20   From: the area   Living: with husband, Valere (1994)   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      Family: 2 children Corean and Optometrist - 2 grandchildren - nearby      Enjoys: read  Exercise: trying to get back to exercise   Diet: healthy, limits fast foods      Safety   Seat belts: Yes    Guns: Yes  and secure   Safe in relationships: Yes    Social Drivers of Health   Tobacco Use: Low Risk (11/28/2024)   Patient History    Smoking Tobacco Use: Never    Smokeless Tobacco Use: Never    Passive Exposure: Not on file  Financial Resource Strain: Low Risk (10/30/2024)   Overall Financial Resource Strain (CARDIA)    Difficulty of Paying Living Expenses: Not hard at all  Food Insecurity: No Food Insecurity (10/30/2024)   Epic    Worried About Radiation Protection Practitioner  of Food in the Last Year: Never true    Ran Out of Food in the Last Year: Never true  Transportation Needs: No Transportation Needs (11/28/2024)   Epic    Lack of Transportation (Medical): No    Lack of Transportation (Non-Medical): No  Physical Activity: Insufficiently Active (10/30/2024)   Exercise Vital Sign    Days of Exercise per Week: 2 days    Minutes of Exercise per Session: 20 min  Stress: No Stress Concern Present (10/30/2024)   [employer] of Occupational Health - Occupational Stress Questionnaire    Feeling of Stress: Only a little  Social Connections: Socially Integrated (11/28/2024)   Social Connection and Isolation Panel    Frequency of Communication with Friends and Family: Three times a week    Frequency of Social Gatherings with Friends and Family: Twice a week    Attends Religious Services: More than 4 times per year    Active Member of Clubs or Organizations: Yes    Attends Banker Meetings: More than 4 times per year    Marital Status: Married  Depression (PHQ2-9): Low Risk (05/11/2024)   Depression (PHQ2-9)    PHQ-2 Score: 1  Alcohol  Screen: Low Risk (03/27/2024)   Alcohol  Screen    Last Alcohol  Screening Score (AUDIT): 0  Housing: Low Risk (10/30/2024)   Epic    Unable to Pay for Housing in the Last Year: No    Number of Times Moved in the Last Year: 0    Homeless in the Last Year: No  Utilities: Not At Risk (11/28/2024)   Epic    Threatened with loss of utilities: No  Health Literacy: Not on file   Family History  Problem Relation Age of Onset   Arthritis Mother    Hearing loss Mother    Heart disease Mother    Hypertension Mother    Miscarriages / Stillbirths Mother    Arthritis Father    Diabetes Father    Heart disease Father    Cancer Maternal Aunt    Heart disease Maternal Grandfather    Breast cancer Neg Hx    Colon cancer Neg Hx    Esophageal cancer Neg Hx    Pancreatic cancer Neg Hx    Stomach cancer Neg Hx    Stroke Neg Hx     Scheduled Meds:  Chlorhexidine  Gluconate Cloth  6 each Topical Daily   feeding supplement  1 Container Oral TID BM   Gerhardt's butt cream   Topical BID   heparin   5,000 Units Subcutaneous Q8H   lactulose   30 g Oral TID   levothyroxine   50 mcg Oral Q0600   pantoprazole  (PROTONIX ) IV  40 mg Intravenous Q24H   rifaximin   550 mg Oral BID   Continuous Infusions:  PRN Meds:.acetaminophen  **OR** acetaminophen , artificial tears, bisacodyl , Muscle Rub, ondansetron  **OR** ondansetron  (ZOFRAN ) IV, mouth rinse, senna-docusate Medications Prior to Admission:  Prior to Admission medications  Medication Sig Start Date End Date Taking? Authorizing Provider  aspirin  EC 81 MG tablet Take 1 tablet (81 mg total) by mouth daily. Swallow whole. 12/26/23  Yes Bryn Bernardino NOVAK, MD  CRANBERRY PO Take 1 tablet by mouth 2 (two) times daily.   Yes [provider]  ENULOSE  10 GM/15ML SOLN Take 45 mLs by mouth See admin instructions. 45 ml by mouth three to four times a day depending on daily plans 07/21/24  Yes [provider]  ferrous sulfate  325 (65 FE) MG tablet Take 1 tablet (325 mg total) by mouth 2 (two) times daily with a meal. 05/28/24 11/29/24 Yes Shahmehdi, Seyed A, MD  furosemide  (LASIX ) 20 MG tablet Take 20-40 mg by mouth daily. Depending on blood pressure   Yes [provider]  hydrocortisone  (ANUSOL -HC) 2.5 % rectal cream Place 1 Application rectally 2 (two) times daily. Patient taking differently: Place 1 Application rectally daily as needed for hemorrhoids or anal itching. 06/30/24  Yes [name], NP  levOCARNitine  (CARNITOR ) 330 MG tablet Take 330 mg by mouth 3 (three) times daily. 05/23/22  Yes [provider]  liver oil-zinc  oxide (DESITIN) 40 % ointment Apply topically daily as needed for irritation. 03/13/23  Yes Pokhrel, Laxman, MD  magnesium  oxide (MAG-OX) 400 MG tablet Take 1 tablet (400 mg total) by mouth daily. 04/05/24  Yes Rojelio Nest, DO  Multiple Vitamin  (MULTI-VITAMIN) tablet Take 1 tablet by mouth in the morning.   Yes [provider]  pantoprazole  (PROTONIX ) 40 MG tablet Take 1 tablet (40 mg total) by mouth daily. 08/02/24 11/29/24 Yes Abran Norleen SAILOR, MD  polyethylene glycol (MIRALAX  / GLYCOLAX ) 17 g packet Take 17 g by mouth daily. Patient taking differently: Take 17 g by mouth daily as needed for mild constipation, moderate constipation or severe constipation. 06/10/24  Yes Rizwan, Saima, MD  Probiotic Product (PROBIOTIC PEARLS WOMENS) CAPS Take 1 capsule by mouth in the morning.   Yes [provider]  rifaximin  (XIFAXAN ) 550 MG TABS tablet Take 1 tablet (550 mg total) by mouth 2 (two) times daily. 12/25/23  Yes Bryn Bernardino NOVAK, MD  spironolactone  (ALDACTONE ) 25 MG tablet Take 50 mg by mouth daily.   Yes [provider]  SYNTHROID  50 MCG tablet Take 1 tablet (50 mcg total) by mouth daily before breakfast. 11/06/24  Yes [name], NP  gabapentin  (NEURONTIN ) 100 MG capsule Take 1 capsule (100 mg total) by mouth 2 (two) times daily. Patient not taking: Reported on 11/29/2024 09/05/24   [name], NP  potassium chloride  SA (KLOR-CON  M) 20 MEQ tablet Take 1 tablet (20 mEq total) by mouth daily. Patient not taking: Reported on 11/29/2024 06/20/24   Darci Pore, MD  valACYclovir  (VALTREX ) 1000 MG tablet Take 1 tablet (1,000 mg total) by mouth 3 (three) times daily. Patient not taking: Reported on 11/29/2024 09/02/24   Juleen Norleen, PA-C   Allergies[1] Review of Systems Generalized weakness Physical Exam Chronically ill-appearing lady sitting up in bed Trying to work on her breakfast tray Monitor noted Regular work of breathing Abdomen is soft and nondistended Has trace bilateral lower extremity edema Overall, patient is awake and alert, answers a few questions appropriately Moves extremities follows commands Vital Signs: BP (!) 137/52   Pulse 67   Temp (!) 97.5 F (36.4 C) (Oral)  Resp 13   Ht 5' 4  (1.626 m)   Wt 64.3 kg   SpO2 96%   BMI 24.33 kg/m  Pain Scale: 0-10 POSS *See Group Information*: 1-Acceptable,Awake and alert Pain Score: 0-No pain   SpO2: SpO2: 96 % O2 Device:SpO2: 96 % O2 Flow Rate: .   IO: Intake/output summary:  Intake/Output Summary (Last 24 hours) at 12/01/2024 1104 Last data filed at 12/01/2024 9346 Gross per 24 hour  Intake 930.02 ml  Output --  Net 930.02 ml    LBM: Last BM Date : 11/29/24 Baseline Weight: Weight: 68 kg Most recent weight: Weight: 64.3 kg     Palliative Assessment/Data:   Palliative performance scale 50%  Time In: 9 Time Out: 10.15 Time Total: 75 Greater than 50%  of this time was spent counseling and coordinating care related to the above assessment and plan.  Signed by: Lonia Serve, MD   Please contact Palliative Medicine Team phone at [phone] for questions and concerns.  For individual provider: See Amion                 [1]  Allergies Allergen Reactions   Contrast Media [Iodinated Contrast Media] Hives   "

## 2024-12-01 NOTE — Plan of Care (Signed)
" °  Problem: Clinical Measurements: Goal: Respiratory complications will improve Outcome: Progressing Goal: Cardiovascular complication will be avoided Outcome: Progressing   Problem: Pain Managment: Goal: General experience of comfort will improve and/or be controlled Outcome: Progressing   Problem: Activity: Goal: Risk for activity intolerance will decrease Outcome: Not Progressing   Problem: Nutrition: Goal: Adequate nutrition will be maintained Outcome: Not Progressing   Problem: Elimination: Goal: Will not experience complications related to bowel motility Outcome: Not Progressing Goal: Will not experience complications related to urinary retention Outcome: Not Progressing   "

## 2024-12-01 NOTE — Plan of Care (Signed)

## 2024-12-02 ENCOUNTER — Inpatient Hospital Stay (HOSPITAL_COMMUNITY)

## 2024-12-02 DIAGNOSIS — K7682 Hepatic encephalopathy: Secondary | ICD-10-CM | POA: Diagnosis not present

## 2024-12-02 LAB — CBC WITH DIFFERENTIAL/PLATELET
Abs Immature Granulocytes: 0 K/uL (ref 0.00–0.07)
Basophils Absolute: 0 K/uL (ref 0.0–0.1)
Basophils Relative: 0 %
Eosinophils Absolute: 0.1 K/uL (ref 0.0–0.5)
Eosinophils Relative: 4 %
HCT: 24.7 % — ABNORMAL LOW (ref 36.0–46.0)
Hemoglobin: 8.1 g/dL — ABNORMAL LOW (ref 12.0–15.0)
Immature Granulocytes: 0 %
Lymphocytes Relative: 30 %
Lymphs Abs: 0.7 K/uL (ref 0.7–4.0)
MCH: 35.5 pg — ABNORMAL HIGH (ref 26.0–34.0)
MCHC: 32.8 g/dL (ref 30.0–36.0)
MCV: 108.3 fL — ABNORMAL HIGH (ref 80.0–100.0)
Monocytes Absolute: 0.2 K/uL (ref 0.1–1.0)
Monocytes Relative: 10 %
Neutro Abs: 1.3 K/uL — ABNORMAL LOW (ref 1.7–7.7)
Neutrophils Relative %: 56 %
Platelets: 99 K/uL — ABNORMAL LOW (ref 150–400)
RBC: 2.28 MIL/uL — ABNORMAL LOW (ref 3.87–5.11)
RDW: 19.8 % — ABNORMAL HIGH (ref 11.5–15.5)
WBC: 2.4 K/uL — ABNORMAL LOW (ref 4.0–10.5)
nRBC: 0 % (ref 0.0–0.2)

## 2024-12-02 LAB — URINALYSIS, ROUTINE W REFLEX MICROSCOPIC
Bilirubin Urine: NEGATIVE
Glucose, UA: NEGATIVE mg/dL
Ketones, ur: NEGATIVE mg/dL
Leukocytes,Ua: NEGATIVE
Nitrite: NEGATIVE
Protein, ur: NEGATIVE mg/dL
Specific Gravity, Urine: 1.014 (ref 1.005–1.030)
pH: 5 (ref 5.0–8.0)

## 2024-12-02 LAB — BASIC METABOLIC PANEL WITH GFR
Anion gap: 6 (ref 5–15)
BUN: 12 mg/dL (ref 8–23)
CO2: 19 mmol/L — ABNORMAL LOW (ref 22–32)
Calcium: 8.7 mg/dL — ABNORMAL LOW (ref 8.9–10.3)
Chloride: 116 mmol/L — ABNORMAL HIGH (ref 98–111)
Creatinine, Ser: 0.99 mg/dL (ref 0.44–1.00)
GFR, Estimated: 60 mL/min — ABNORMAL LOW
Glucose, Bld: 99 mg/dL (ref 70–99)
Potassium: 4.2 mmol/L (ref 3.5–5.1)
Sodium: 141 mmol/L (ref 135–145)

## 2024-12-02 LAB — APTT: aPTT: 160 s — ABNORMAL HIGH (ref 24–36)

## 2024-12-02 LAB — PROTIME-INR
INR: 1.3 — ABNORMAL HIGH (ref 0.8–1.2)
Prothrombin Time: 16.9 s — ABNORMAL HIGH (ref 11.4–15.2)

## 2024-12-02 LAB — AMMONIA: Ammonia: 33 umol/L (ref 9–35)

## 2024-12-02 MED ORDER — SODIUM CHLORIDE 0.9 % IV SOLN: INTRAVENOUS | Status: AC

## 2024-12-02 MED ORDER — LACTULOSE ENEMA
300.0000 mL | Freq: Three times a day (TID) | ORAL | Status: DC
  Administered 2024-12-02 – 2024-12-03 (×5): 300 mL via RECTAL
  Filled 2024-12-02 (×7): qty 300

## 2024-12-02 MED ORDER — THIAMINE HCL 100 MG/ML IJ SOLN
400.0000 mg | Freq: Three times a day (TID) | INTRAVENOUS | Status: AC
  Administered 2024-12-02 – 2024-12-05 (×9): 400 mg via INTRAVENOUS
  Filled 2024-12-02 (×9): qty 4

## 2024-12-02 NOTE — Progress Notes (Signed)
 HISTORY OF PRESENT ILLNESS:  [name] is a 74 y.o. female admitted with hepatic encephalopathy.  She was turning the corner a bit yesterday.  However this morning she is not arousable.  Vital signs stable.  Husband [name] in the room.  Receiving lactulose  enemas 3 times daily.  Ammonia level has come down from 222-32 (normal).  Hemoglobin 8.1.  No obvious GI bleeding or other clear-cut precipitants.  REVIEW OF SYSTEMS:  All non-GI ROS negative Past Medical History:  Diagnosis Date   Acute hepatic encephalopathy (HCC) 03/30/2024   Acute urinary retention 05/04/2022   Allergy 1952   Contrast dye   Arthritis    Knees and thumb   Blood transfusion without reported diagnosis    Cancer [facility])    cecum   Cataract    Surgery scheduled July 2023   Colon cancer (HCC) 2003   Elevated liver function tests    Esophageal varices (HCC)    Heart murmur    Hemorrhage of gastrointestinal tract 05/04/2011   Hepatic encephalopathy (HCC) 2025   Hypertension    Hypothyroidism    Iron deficiency anemia    Liver disease    chemotherapy complication, per pt, shunts placed to bypass liver   Malignant neoplasm of cecum (HCC)    Myocardial infarction [facility]) Jan 2025   Portal hypertension (HCC)    Skin cancer 2019   Splenomegaly    Stroke [facility])    Ulcer 05/2024    Past Surgical History:  Procedure Laterality Date   COLON SURGERY     Cancer   COSMETIC SURGERY     Skin cancer   ESOPHAGEAL VARICE LIGATION     ESOPHAGOGASTRODUODENOSCOPY N/A 05/28/2024   Procedure: EGD (ESOPHAGOGASTRODUODENOSCOPY);  Surgeon: Katherine Gordy HERO, MD;  Location: [facility];  Service: Gastroenterology;  Laterality: N/A;   EYE SURGERY     For cataracts   HEMICOLECTOMY  01/08/2003   IR RADIOLOGIST EVAL & MGMT  12/20/2020   IR RADIOLOGIST EVAL & MGMT  05/29/2021   LIVER SURGERY     shunts placed after chemo complication   SKIN FULL THICKNESS GRAFT N/A 09/12/2019   Procedure: debridement and FTSG to the nose  from left upper arm;  Surgeon: Katherine Craig RAMAN, MD;  Location: [facility];  Service: Plastics;  Laterality: N/A;  2 hours, please   TIPS PROCEDURE      Social History [name]  reports that she has never smoked. She has never used smokeless tobacco. She reports that she does not drink alcohol  and does not use drugs.  family history includes Arthritis in her father and mother; Cancer in her maternal aunt; Diabetes in her father; Hearing loss in her mother; Heart disease in her father, maternal grandfather, and mother; Hypertension in her mother; Miscarriages / Stillbirths in her mother.  Allergies[1]     PHYSICAL EXAMINATION: Vital signs: BP (!) 129/57 (BP Location: Left Arm)   Pulse 77   Temp (!) 94.3 F (34.6 C) (Bladder)   Resp (!) 21   Ht 5' 4 (1.626 m)   Wt 64.3 kg   SpO2 96%   BMI 24.33 kg/m   Constitutional: Not arousable, no acute distress Psychiatric: Not arousable Eyes: Anicteric Abdomen: soft, nontender, nondistended, no obvious ascites, no peritoneal signs, normal bowel sounds, no organomegaly Rectal: Rectal tube in place Extremities: no clubbing or cyanosis.  Lower extremity edema bilaterally Skin: no relevant lesions on visible extremities Neuro: No focal deficits.  Cannot assess for asterixis.  ASSESSMENT:  1.  Hepatic encephalopathy.  Worse today.  Negative head CT on arrival.  Ammonia improved. 2.  General Medical problems  PLAN:  1.  Continue lactulose  enemas.  May need to increase frequency to 4 times daily.  Could also consider placing a Dobbhoff tube through which lactulose  could be administered  Will continue to follow         [1]  Allergies Allergen Reactions   Contrast Media [Iodinated Contrast Media] Hives

## 2024-12-02 NOTE — Plan of Care (Signed)

## 2024-12-02 NOTE — Plan of Care (Signed)
" °  Problem: Health Behavior/Discharge Planning: Goal: Ability to manage health-related needs will improve Outcome: Not Progressing   Problem: Activity: Goal: Risk for activity intolerance will decrease Outcome: Not Progressing   Problem: Nutrition: Goal: Adequate nutrition will be maintained Outcome: Not Progressing   Problem: Elimination: Goal: Will not experience complications related to bowel motility Outcome: Not Progressing Goal: Will not experience complications related to urinary retention Outcome: Not Progressing   "

## 2024-12-02 NOTE — Progress Notes (Signed)
 " PROGRESS NOTE [name]  FMW:[id number] DOB: 1951/05/16 DOA: 11/28/2024 PCP: [name], NP  Brief Narrative/Hospital Course: [name] is a 74 y.o. female with PMH of  cirrhosis 2/2 chemotherapy s/p TIPS c/b hepatic encephalopathy, colon cancer s/p hemicolectomy, CVA, IDA, hypothyroidism, HTN and gastritis who presented to the ED for evaluation of altered mental status. As per spouse, patient is prescribed 3 teaspoons of lactulose  4 times a day however he has been doing 3 times a day with good 2-3 bowel movements a day. Synthroid  dose was reduced from 75 to 50 mcg 3 weeks ago.  Over the last week, he has noticed patient has had decreased bowel movement on her current lactulose  dose. Over the last few days, she has had mild tremor and has become more sleepy but has been able to have conversations as usual  but on 1/6 am patient w/ significant amount of stool and altered more than her baseline so he presented her to the ED for further evaluation.  ED Course: Initial vitals show patient afebrile and normotensive. Initial labs significant for K+ 5.3, BUNs/creatinine 32/1.53, AST/ALT 79/49, alk phos 228, ammonia 222, WBC 5.0, Hgb 10.8, platelet 120, glucose 84. CXR shows cardiomegaly but no active disease. CT head with no acute intracranial abnormality.  Patient given IV fluids and admitted w/ lactulose  enema.  Subjective: Seen and examined Eyes open but do not vocalize not following commands not a response-A1 to pen placed midline Overnight mentation fluctuating, having urine retention needing In-N-Out cath, received enema.  BP stable temperature is 97.5 Labs remains overall stable improving metabolic acidosis, has ongoing pancytopenia Husband at the bedside  Assessment and plan:  Acute hepatic encephalopathy Liver cirrhosis secondary to chemo status post TIPS History of esophageal varices and portal hypertension: Patient presenting with altered mental status tremor and decreasing  bowel movement despite being on lactulose  No other obvious trigger for HE.CT head no acute finding ammonia level 222> now at 43,afebrile WBC stable, UA unremarkable Blood culture sent 1/8- NGTD.  Mentation about slowly improving able to have some conversation-but now eyes open not vocalizing no response.will continue to treat for underlying hepatic encephalopathy changed to lactulose  enema 3 times daily  Will get MRI brain, EEG CXR.Her last UA chest x-ray and blood culture were unremarkable GI following closely. Cont lactulose  w/ goals of 3-4 BM/day will uptitrate, cont rifaximin  if able to take po.   Keep n.p.o. and continue IVF  Hypothermia:resolving Transferred to stepdown 1/7 overnight for [employer].No obvious signs of infection on labs leukopenia.  Also anemia and hyperglycemia Cortisol stable 10.5. she has had recurrent issues w/ hypothermia on chart review back in July and August. Suspected her cirrhosis, poor oral intake hypoglycemia contributing.  Urine retention: Checking UA again, insert Foley catheter  Hypoglycemia: Stable  GERD. Cont ppi  Hypothyroidism Recently Synthroid  dose change 75 mcg> no 50 mcg in mid December-Repeat TSH 1.33.ft4 ok.  Continue current dose  AKI Mild metabolic acidosis Hyperkalemia-resolved Hyperchloremia: Creatinine improved 1.1, monitor electrolytes wean off IV fluids  Pancytopenia: Suspect in the setting of liver cirrhosis. Trend labs   CVA hx HTN: BP stable. Po meds on hold pending improvement in mental status   GERD: Continue PPI  GOC: Patient has significant medical comorbidities at high risk for recurrent admission, decompensation, consult PMT. She is full code  Husband updated again at the bedside, is concerned about her fluctuating status  Mobility: PT Orders: Active PT Follow up Rec: Outpatient Pt (Pt/Spouse Preference)11/30/2024 1600  DVT prophylaxis: heparin  injection 5,000 Units Start: 11/28/24 1730 Code Status:    Code Status: Full Code Family Communication: plan of care discussed with patient at bedside.Discussed with the husband  Patient status is: Remains hospitalized because of severity of illness Level of care: Stepdown   Dispo: The patient is from: Home with husband            Anticipated disposition: TBD Objective: Vitals last 24 hrs: Vitals:   12/02/24 0400 12/02/24 0500 12/02/24 0600 12/02/24 0800  BP: 137/64 (!) 127/58 (!) 130/58   Pulse: 82 72 75   Resp: (!) 22 13 18    Temp: (!) 97.5 F (36.4 C)   (!) 96.5 F (35.8 C)  TempSrc: Axillary   Axillary  SpO2: 95% 97% 96%   Weight:      Height:        Physical Examination: General exam: alert awake but not following commands  HEENT:Oral mucosa moist, Ear/Nose WNL grossly Respiratory system: Bilaterally clear BS Cardiovascular system: S1 & S2 +, No JVD. Gastrointestinal system: Abdomen soft,NT,ND, BS+ Nervous System: Alert, awake, no response Extremities: extremities warm, leg edema trace Skin: Warm, no rashes MSK: Normal muscle bulk,tone, power   Medications reviewed:  Scheduled Meds:  Chlorhexidine  Gluconate Cloth  6 each Topical Daily   feeding supplement  1 Container Oral TID BM   Gerhardt's butt cream   Topical BID   heparin   5,000 Units Subcutaneous Q8H   lactulose   300 mL Rectal TID   levothyroxine   50 mcg Oral Q0600   pantoprazole  (PROTONIX ) IV  40 mg Intravenous Q24H   rifaximin   550 mg Oral BID   Continuous Infusions:  sodium chloride  100 mL/hr at 12/02/24 0555    Diet: Diet Order             Diet NPO time specified  Diet effective now                    Data Reviewed: I have personally reviewed following labs and imaging studies ( see epic result tab) CBC: Recent Labs  Lab 11/28/24 1442 11/29/24 0411 11/30/24 0320 12/01/24 0324 12/02/24 0306  WBC 5.0 3.8* 2.0* 3.1* 2.4*  NEUTROABS 3.7  --   --  1.7 1.3*  HGB 10.8* 10.1* 8.5* 8.5* 8.1*  HCT 32.8* 29.6* 25.4* 26.2* 24.7*  MCV 104.8* 104.2*  106.3* 107.8* 108.3*  PLT 120* 103* 97* 113* 99*   CMP: Recent Labs  Lab 11/28/24 1442 11/29/24 0411 11/30/24 0320 12/01/24 0324 12/02/24 0306  NA 139 141 144 140 141  K 5.3* 4.5 4.6 4.0 4.2  CL 102 109 116* 114* 116*  CO2 25 21* 20* 17* 19*  GLUCOSE 84 82 66* 86 99  BUN 32* 31* 24* 19 12  CREATININE 1.53* 1.37* 1.13* 1.16* 0.99  CALCIUM  10.0 9.2 8.8* 8.9 8.7*   GFR: Estimated Creatinine Clearance: 43.7 mL/min (by C-G formula based on SCr of 0.99 mg/dL). Recent Labs  Lab 11/28/24 1442 11/29/24 0411 11/30/24 0320  AST 79* 63* 63*  ALT 49* 39 36  ALKPHOS 228* 196* 154*  BILITOT 1.8* 1.5* 1.1  PROT 6.6 5.8* 4.7*  ALBUMIN  3.3* 3.0* 2.6*   No results for input(s): LIPASE, AMYLASE in the last 168 hours.  Recent Labs  Lab 11/28/24 1442 11/30/24 0320 12/01/24 0324 12/02/24 0306  AMMONIA 222* 43* 32 33   Coagulation Profile:  Recent Labs  Lab 11/29/24 0411 12/01/24 0324  INR 1.4* 1.4*   Unresulted Labs (From  admission, onward)     Start     Ordered   12/03/24 0500  CBC  Tomorrow morning,   R       Question:  Specimen collection method  Answer:  Lab=Lab collect   12/02/24 0902   12/02/24 0500  Basic metabolic panel with GFR  Daily,   R     Question:  Specimen collection method  Answer:  Lab=Lab collect   12/01/24 0837   12/01/24 2322  Urinalysis, Routine w reflex microscopic -Urine, Clean Catch  Once,   R       Question:  Specimen Source  Answer:  Urine, Clean Catch   12/01/24 2322           Antimicrobials/Microbiology: Anti-infectives (From admission, onward)    Start     Dose/Rate Route Frequency Ordered Stop   11/30/24 1000  rifaximin  (XIFAXAN ) tablet 550 mg        550 mg Oral 2 times daily 11/30/24 0914           Component Value Date/Time   SDES  11/30/2024 9057    BLOOD LEFT HAND Performed at [facility], [address] [place], [place] [address]    SPECREQUEST  11/30/2024 [phone]    BOTTLES DRAWN AEROBIC ONLY Blood Culture results  may not be optimal due to an inadequate volume of blood received in culture bottles Performed at [facility], [address], [place], [place] [address]    CULT  11/30/2024 [phone]    NO GROWTH 2 DAYS Performed at [facility], [address], [place], [place] [address]    REPTSTATUS PENDING 11/30/2024 9057    Procedures:   Mennie LAMY, MD [facility] 12/02/2024, 9:02 AM   "

## 2024-12-03 DIAGNOSIS — K7682 Hepatic encephalopathy: Secondary | ICD-10-CM | POA: Diagnosis not present

## 2024-12-03 LAB — BASIC METABOLIC PANEL WITH GFR
Anion gap: 7 (ref 5–15)
BUN: 11 mg/dL (ref 8–23)
CO2: 18 mmol/L — ABNORMAL LOW (ref 22–32)
Calcium: 8.5 mg/dL — ABNORMAL LOW (ref 8.9–10.3)
Chloride: 118 mmol/L — ABNORMAL HIGH (ref 98–111)
Creatinine, Ser: 0.86 mg/dL (ref 0.44–1.00)
GFR, Estimated: >60 mL/min
Glucose, Bld: 67 mg/dL — ABNORMAL LOW (ref 70–99)
Potassium: 4.4 mmol/L (ref 3.5–5.1)
Sodium: 142 mmol/L (ref 135–145)

## 2024-12-03 LAB — CBC
HCT: 24.6 % — ABNORMAL LOW (ref 36.0–46.0)
Hemoglobin: 7.8 g/dL — ABNORMAL LOW (ref 12.0–15.0)
MCH: 35 pg — ABNORMAL HIGH (ref 26.0–34.0)
MCHC: 31.7 g/dL (ref 30.0–36.0)
MCV: 110.3 fL — ABNORMAL HIGH (ref 80.0–100.0)
Platelets: 107 K/uL — ABNORMAL LOW (ref 150–400)
RBC: 2.23 MIL/uL — ABNORMAL LOW (ref 3.87–5.11)
RDW: 20.4 % — ABNORMAL HIGH (ref 11.5–15.5)
WBC: 2.2 K/uL — ABNORMAL LOW (ref 4.0–10.5)
nRBC: 0 % (ref 0.0–0.2)

## 2024-12-03 MED ORDER — SODIUM CHLORIDE 0.9 % IV BOLUS
500.0000 mL | Freq: Once | INTRAVENOUS | Status: AC
  Administered 2024-12-03: 500 mL via INTRAVENOUS

## 2024-12-03 MED ORDER — SODIUM CHLORIDE 0.9 % IV SOLN: INTRAVENOUS | Status: DC

## 2024-12-03 NOTE — Progress Notes (Signed)
 HISTORY OF PRESENT ILLNESS:  [name] is a 74 y.o. female with hepatic encephalopathy.  Doing much better today.  Alert and oriented.  Eating.  Husband in room.  No problems overnight.  LABORATORIES: Hemoglobin 7.8, platelets 107 Glucose 67, creatinine 0.86 INR 1.3 (yesterday) Ammonia 33 (yesterday)  REVIEW OF SYSTEMS:  All non-GI ROS negative.  Past Medical History:  Diagnosis Date   Acute hepatic encephalopathy (HCC) 03/30/2024   Acute urinary retention 05/04/2022   Allergy 1952   Contrast dye   Arthritis    Knees and thumb   Blood transfusion without reported diagnosis    Cancer [facility])    cecum   Cataract    Surgery scheduled July 2023   Colon cancer (HCC) 2003   Elevated liver function tests    Esophageal varices (HCC)    Heart murmur    Hemorrhage of gastrointestinal tract 05/04/2011   Hepatic encephalopathy (HCC) 2025   Hypertension    Hypothyroidism    Iron deficiency anemia    Liver disease    chemotherapy complication, per pt, shunts placed to bypass liver   Malignant neoplasm of cecum (HCC)    Myocardial infarction [facility]) Jan 2025   Portal hypertension (HCC)    Skin cancer 2019   Splenomegaly    Stroke [facility])    Ulcer 05/2024    Past Surgical History:  Procedure Laterality Date   COLON SURGERY     Cancer   COSMETIC SURGERY     Skin cancer   ESOPHAGEAL VARICE LIGATION     ESOPHAGOGASTRODUODENOSCOPY N/A 05/28/2024   Procedure: EGD (ESOPHAGOGASTRODUODENOSCOPY);  Surgeon: Albertus Gordy HERO, MD;  Location: [facility];  Service: Gastroenterology;  Laterality: N/A;   EYE SURGERY     For cataracts   HEMICOLECTOMY  01/08/2003   IR RADIOLOGIST EVAL & MGMT  12/20/2020   IR RADIOLOGIST EVAL & MGMT  05/29/2021   LIVER SURGERY     shunts placed after chemo complication   SKIN FULL THICKNESS GRAFT N/A 09/12/2019   Procedure: debridement and FTSG to the nose from left upper arm;  Surgeon: Elisabeth Craig RAMAN, MD;  Location: [facility];  Service:  Plastics;  Laterality: N/A;  2 hours, please   TIPS PROCEDURE      Social History Yarel Kilcrease  reports that she has never smoked. She has never used smokeless tobacco. She reports that she does not drink alcohol  and does not use drugs.  family history includes Arthritis in her father and mother; Cancer in her maternal aunt; Diabetes in her father; Hearing loss in her mother; Heart disease in her father, maternal grandfather, and mother; Hypertension in her mother; Miscarriages / Stillbirths in her mother.  Allergies[1]     PHYSICAL EXAMINATION: Vital signs: BP (!) 101/37 (BP Location: Left Arm)   Pulse 73   Temp (!) 97.2 F (36.2 C)   Resp 14   Ht 5' 4 (1.626 m)   Wt 64.3 kg   SpO2 95%   BMI 24.33 kg/m   Constitutional: Chronically ill-appearing, fatigued, no acute distress Psychiatric: alert and oriented x3, cooperative Eyes: Anicteric Mouth: Dry Cardiovascular: heart regular rate and rhythm Lungs: clear to auscultation bilaterally with decreased breath sounds at the bases Abdomen: soft, nontender, nondistended, no obvious ascites, no peritoneal signs, normal bowel sounds, no organomegaly Rectal: Omitted Extremities: no clubbing or cyanosis.  1+ lower extremity edema bilaterally Skin: Multiple ecchymoses Neuro: Asterixis present.  ASSESSMENT:  1.  Hepatic encephalopathy.  She responded to  lactulose  enemas.  Much improved today.   PLAN:  1.  Lactulose  60 cc p.o. 3 times daily.  Titrate to have 3-5 bowel movements per day 2.  Continue Xifaxan  550 mg p.o. twice daily GI will follow.        [1]  Allergies Allergen Reactions   Contrast Media [Iodinated Contrast Media] Hives

## 2024-12-03 NOTE — Plan of Care (Signed)

## 2024-12-03 NOTE — Plan of Care (Signed)
" °  Problem: Clinical Measurements: Goal: Ability to maintain clinical measurements within normal limits will improve Outcome: Not Progressing Goal: Diagnostic test results will improve Outcome: Not Progressing   Problem: Activity: Goal: Risk for activity intolerance will decrease Outcome: Not Progressing   Problem: Nutrition: Goal: Adequate nutrition will be maintained Outcome: Not Progressing   Problem: Elimination: Goal: Will not experience complications related to bowel motility Outcome: Not Progressing Goal: Will not experience complications related to urinary retention Outcome: Not Progressing   "

## 2024-12-03 NOTE — Progress Notes (Signed)
 " PROGRESS NOTE [name]  FMW:[id number] DOB: 07-20-1951 DOA: 11/28/2024 PCP: [name], NP  Brief Narrative/Hospital Course: [name] is a 74 y.o. female with PMH of  cirrhosis 2/2 chemotherapy s/p TIPS c/b hepatic encephalopathy, colon cancer s/p hemicolectomy, CVA, IDA, hypothyroidism, HTN and gastritis who presented to the ED for evaluation of altered mental status. As per spouse, patient is prescribed 3 teaspoons of lactulose  4 times a day however he has been doing 3 times a day with good 2-3 bowel movements a day. Synthroid  dose was reduced from 75 to 50 mcg 3 weeks ago.  Over the last week, he has noticed patient has had decreased bowel movement on her current lactulose  dose. Over the last few days, she has had mild tremor and has become more sleepy but has been able to have conversations as usual  but on 1/6 am patient w/ significant amount of stool and altered more than her baseline so he presented her to the ED for further evaluation.  ED Course: Initial vitals show patient afebrile and normotensive. Initial labs significant for K+ 5.3, BUNs/creatinine 32/1.53, AST/ALT 79/49, alk phos 228, ammonia 222, WBC 5.0, Hgb 10.8, platelet 120, glucose 84. CXR shows cardiomegaly but no active disease. CT head with no acute intracranial abnormality.  Patient given IV fluids and admitted w/ lactulose  enema.  Subjective: Seen and examined Sleeping at the bedside Patient is much more alert awake interactive hungry and wants to eat She started feeling like this since yesterday evening Overnight temperature 97, vitals otherwise stable labs shows blood sugar 67 overall pancytopenia Having BM with FMS in place  Assessment and plan:  Acute hepatic encephalopathy Liver cirrhosis secondary to chemo status post TIPS History of esophageal varices and portal hypertension: Patient presenting with altered mental status tremor and decreasing bowel movement despite being on lactulose  No other  obvious trigger for HE.CT head no acute finding ammonia level 222> now at 43,afebrile WBC stable, UA unremarkable Blood culture sent 1/8- NGTD.  Mentation about slowly improving able to have some conversation-but since 1/9-eyes open but not following any commands> subsequently started to improve and communicative 1/10 evening. CXR.Her last UA chest x-ray and blood culture were unremarkable MRI brain no acute finding, EEG pending will likely be low yield Cont to treat for underlying hepatic encephalopathy w/  lactulose  enema 3 times daily .  Placed on high-dose thiamine  1/10 complete the course GI following closely. Cont lactulose  w/ goals of 3-4 BM/day will uptitrate, cont rifaximin   Hypothermia:resolving Transferred to stepdown 1/7 overnight for [employer]No obvious signs of infection on labs leukopenia.  Also anemia and hyperglycemia Cortisol stable 10.5. she has had recurrent issues w/ hypothermia on chart review back in July and August. Suspected her cirrhosis, poor oral intake hypoglycemia contributing.  Urine retention: cont Foley catheter, and IVF due to poor output and increasing.  Stop IV fluids once tolerating p.o.  Hypoglycemia: Intermittently low, encourage oral intake   GERD. Cont ppi  Hypothyroidism Recently Synthroid  dose change 75 mcg> no 50 mcg in mid December-Repeat TSH 1.33.ft4 ok.  Continue current dose  AKI Mild metabolic acidosis Hyperkalemia-resolved Hyperchloremia: Electrolytes remains stable although chloride up with IV fluids potassium sodium stable   Pancytopenia: Due to her liver cirrhosis. Trend labs-transfuse if Hb< 7 or platelet <10.  Having bleeding from the heparin  injection site at times   CVA hx HTN: BP stable. Po meds on hold pending improvement in mental status   GERD: Continue PPI  GOC: Patient  has significant medical comorbidities at high risk for recurrent admission, decompensation, consulted PMT. She is full code at this time with  full scope of treatment although remains at high risk for readmissions decompensation overall prognosis not bright on long-term  Mobility: PT Orders: Active PT Follow up Rec: Outpatient Pt (Pt/Spouse Preference)11/30/2024 1600    DVT prophylaxis: heparin  injection 5,000 Units Start: 11/28/24 1730 Code Status:   Code Status: Full Code Family Communication: plan of care discussed with patient and her husband at bedside Patient status is: Remains hospitalized because of severity of illness Level of care: Stepdown   Dispo: The patient is from: Home with husband            Anticipated disposition: TBD Objective: Vitals last 24 hrs: Vitals:   12/03/24 0500 12/03/24 0600 12/03/24 0700 12/03/24 0800  BP: (!) 112/46 (!) 104/43 (!) 106/47 (!) 101/37  Pulse: 74 71 75 73  Resp: 13 12 10 14   Temp: (!) 97.5 F (36.4 C) (!) 97 F (36.1 C) (!) 97 F (36.1 C) (!) 97.2 F (36.2 C)  TempSrc:      SpO2: 93% 94% 94% 95%  Weight:      Height:       Physical Examination: General exam: AAOX2-3 HEENT:Oral mucosa moist, Ear/Nose WNL grossly Respiratory system: Bilaterally clear BS, no use of accessory muscle Cardiovascular system: S1 & S2 +, No JVD. Gastrointestinal system: Abdomen soft, nontender nondistended ,BS+, fms+ Nervous System: Alert, awake, follows commands  Extremities: extremities warm, leg edema trace Skin: Warm, no rashes MSK: Normal muscle bulk,tone, power   Medications reviewed:  Scheduled Meds:  Chlorhexidine  Gluconate Cloth  6 each Topical Daily   feeding supplement  1 Container Oral TID BM   Gerhardt's butt cream   Topical BID   heparin   5,000 Units Subcutaneous Q8H   lactulose   300 mL Rectal TID   levothyroxine   50 mcg Oral Q0600   pantoprazole  (PROTONIX ) IV  40 mg Intravenous Q24H   rifaximin   550 mg Oral BID   Continuous Infusions:  sodium chloride  100 mL/hr at 12/03/24 0700   thiamine  (VITAMIN B1) injection 400 mg (12/03/24 0929)    Diet: Diet Order              DIET DYS 3 Room service appropriate? Yes; Fluid consistency: Thin  Diet effective now                    Data Reviewed: I have personally reviewed following labs and imaging studies ( see epic result tab) CBC: Recent Labs  Lab 11/28/24 1442 11/29/24 0411 11/30/24 0320 12/01/24 0324 12/02/24 0306 12/03/24 0302  WBC 5.0 3.8* 2.0* 3.1* 2.4* 2.2*  NEUTROABS 3.7  --   --  1.7 1.3*  --   HGB 10.8* 10.1* 8.5* 8.5* 8.1* 7.8*  HCT 32.8* 29.6* 25.4* 26.2* 24.7* 24.6*  MCV 104.8* 104.2* 106.3* 107.8* 108.3* 110.3*  PLT 120* 103* 97* 113* 99* 107*   CMP: Recent Labs  Lab 11/29/24 0411 11/30/24 0320 12/01/24 0324 12/02/24 0306 12/03/24 0302  NA 141 144 140 141 142  K 4.5 4.6 4.0 4.2 4.4  CL 109 116* 114* 116* 118*  CO2 21* 20* 17* 19* 18*  GLUCOSE 82 66* 86 99 67*  BUN 31* 24* 19 12 11   CREATININE 1.37* 1.13* 1.16* 0.99 0.86  CALCIUM  9.2 8.8* 8.9 8.7* 8.5*   GFR: Estimated Creatinine Clearance: 50.3 mL/min (by C-G formula based on SCr of 0.86 mg/dL). Recent Labs  Lab 11/28/24 1442 11/29/24 0411 11/30/24 0320  AST 79* 63* 63*  ALT 49* 39 36  ALKPHOS 228* 196* 154*  BILITOT 1.8* 1.5* 1.1  PROT 6.6 5.8* 4.7*  ALBUMIN  3.3* 3.0* 2.6*   No results for input(s): LIPASE, AMYLASE in the last 168 hours.  Recent Labs  Lab 11/28/24 1442 11/30/24 0320 12/01/24 0324 12/02/24 0306  AMMONIA 222* 43* 32 33   Coagulation Profile:  Recent Labs  Lab 11/29/24 0411 12/01/24 0324 12/02/24 1231  INR 1.4* 1.4* 1.3*   Unresulted Labs (From admission, onward)     Start     Ordered   12/02/24 1334  Vitamin B1  Add-on,   AD       Question:  Specimen collection method  Answer:  Lab=Lab collect   12/02/24 1333           Antimicrobials/Microbiology: Anti-infectives (From admission, onward)    Start     Dose/Rate Route Frequency Ordered Stop   11/30/24 1000  rifaximin  (XIFAXAN ) tablet 550 mg        550 mg Oral 2 times daily 11/30/24 0914           Component Value  Date/Time   SDES  11/30/2024 9057    BLOOD LEFT HAND Performed at [facility], [address], [place], [place] [address]    SPECREQUEST  11/30/2024 [phone]    BOTTLES DRAWN AEROBIC ONLY Blood Culture results may not be optimal due to an inadequate volume of blood received in culture bottles Performed at [facility], [address] [place], [place] [address]    CULT  11/30/2024 [phone]    NO GROWTH 3 DAYS Performed at [facility], [address], [place], [place] [address]    REPTSTATUS PENDING 11/30/2024 9057    Procedures:   Mennie LAMY, MD [facility] 12/03/2024, 9:31 AM   "

## 2024-12-04 DIAGNOSIS — N179 Acute kidney failure, unspecified: Secondary | ICD-10-CM | POA: Diagnosis not present

## 2024-12-04 DIAGNOSIS — K7682 Hepatic encephalopathy: Secondary | ICD-10-CM | POA: Diagnosis not present

## 2024-12-04 LAB — COMPREHENSIVE METABOLIC PANEL WITH GFR
ALT: 30 U/L (ref 0–44)
AST: 53 U/L — ABNORMAL HIGH (ref 15–41)
Albumin: 2.3 g/dL — ABNORMAL LOW (ref 3.5–5.0)
Alkaline Phosphatase: 144 U/L — ABNORMAL HIGH (ref 38–126)
Anion gap: 8 (ref 5–15)
BUN: 12 mg/dL (ref 8–23)
CO2: 19 mmol/L — ABNORMAL LOW (ref 22–32)
Calcium: 8.8 mg/dL — ABNORMAL LOW (ref 8.9–10.3)
Chloride: 113 mmol/L — ABNORMAL HIGH (ref 98–111)
Creatinine, Ser: 1.05 mg/dL — ABNORMAL HIGH (ref 0.44–1.00)
GFR, Estimated: 56 mL/min — ABNORMAL LOW
Glucose, Bld: 81 mg/dL (ref 70–99)
Potassium: 4.5 mmol/L (ref 3.5–5.1)
Sodium: 140 mmol/L (ref 135–145)
Total Bilirubin: 0.7 mg/dL (ref 0.0–1.2)
Total Protein: 4.6 g/dL — ABNORMAL LOW (ref 6.5–8.1)

## 2024-12-04 LAB — CBC
HCT: 21.9 % — ABNORMAL LOW (ref 36.0–46.0)
Hemoglobin: 7.2 g/dL — ABNORMAL LOW (ref 12.0–15.0)
MCH: 36 pg — ABNORMAL HIGH (ref 26.0–34.0)
MCHC: 32.9 g/dL (ref 30.0–36.0)
MCV: 109.5 fL — ABNORMAL HIGH (ref 80.0–100.0)
Platelets: 101 K/uL — ABNORMAL LOW (ref 150–400)
RBC: 2 MIL/uL — ABNORMAL LOW (ref 3.87–5.11)
RDW: 20.1 % — ABNORMAL HIGH (ref 11.5–15.5)
WBC: 2.4 K/uL — ABNORMAL LOW (ref 4.0–10.5)
nRBC: 0 % (ref 0.0–0.2)

## 2024-12-04 MED ORDER — SODIUM CHLORIDE 0.9 % IV BOLUS
500.0000 mL | INTRAVENOUS | Status: DC | PRN
  Administered 2024-12-05: 500 mL via INTRAVENOUS

## 2024-12-04 MED ORDER — SODIUM CHLORIDE 0.9 % IV BOLUS: 500.0000 mL | Freq: Once | INTRAVENOUS | Status: DC

## 2024-12-04 MED ORDER — LACTULOSE 10 GM/15ML PO SOLN
40.0000 g | Freq: Three times a day (TID) | ORAL | Status: DC
  Administered 2024-12-04 – 2024-12-07 (×10): 40 g via ORAL
  Filled 2024-12-04 (×10): qty 60

## 2024-12-04 MED ORDER — MIDODRINE HCL 5 MG PO TABS
10.0000 mg | ORAL_TABLET | Freq: Three times a day (TID) | ORAL | Status: DC
  Administered 2024-12-04 – 2024-12-07 (×9): 10 mg via ORAL
  Filled 2024-12-04 (×9): qty 2

## 2024-12-04 NOTE — Plan of Care (Signed)
  Problem: Clinical Measurements: Goal: Cardiovascular complication will be avoided Outcome: Progressing   Problem: Activity: Goal: Risk for activity intolerance will decrease Outcome: Progressing   Problem: Nutrition: Goal: Adequate nutrition will be maintained Outcome: Progressing   Problem: Coping: Goal: Level of anxiety will decrease Outcome: Progressing   Problem: Elimination: Goal: Will not experience complications related to urinary retention Outcome: Progressing   

## 2024-12-04 NOTE — Progress Notes (Signed)
 "   Progress Note   LOS: 6 days   Chief Complaint: Hepatic encephalopathy   Subjective   Husband in the room at the time of my evaluation.  Husband reports patient is doing the best she has done since initial admission.  He reports this is the first time he has been able to truly have a conversation with her in the last 5 days.  Patient is alert and oriented and has no complaints today.  Tolerating diet without difficulty.  No asterixis on exam   Objective   Vital signs in last 24 hours: Temp:  [95.5 F (35.3 C)-97.6 F (36.4 C)] 97.6 F (36.4 C) (01/12 0743) Pulse Rate:  [58-81] 65 (01/12 0900) Resp:  [10-19] 16 (01/12 0900) BP: (89-114)/(34-55) 92/34 (01/12 0900) SpO2:  [92 %-96 %] 95 % (01/12 0900) Last BM Date : 12/04/24 Last BM recorded by nurses in past 5 days Stool Type: Type 7 (Liquid consistency with no solid pieces) (12/04/2024  4:40 AM)  General:   female in no acute distress, chronically ill-appearing Heart:  Regular rate and rhythm; no murmurs Pulm: Clear anteriorly; no wheezing Abdomen: soft, nondistended, normal bowel sounds in all quadrants. Nontender without guarding. No organomegaly appreciated. Extremities:  No edema Neurologic:  Alert and  oriented x4;  No focal deficits.  Psych:  Cooperative. Normal mood and affect.  Intake/Output from previous day: 01/11 0701 - 01/12 0700 In: 1695.9 [I.V.:812; IV Piggyback:663.9] Out: 1370 [Urine:570; Stool:800] Intake/Output this shift: Total I/O In: 221.2 [I.V.:167.2; IV Piggyback:54] Out: -   Studies/Results: MR BRAIN WO CONTRAST Result Date: 12/02/2024 EXAM: MRI BRAIN WITHOUT CONTRAST 12/02/2024 05:58:20 PM TECHNIQUE: Multiplanar multisequence MRI of the head/brain was performed without the administration of intravenous contrast. COMPARISON: Head CT 11/28/2024 and MRI 05/24/2024. CLINICAL HISTORY: Mental status change, unknown cause. FINDINGS: BRAIN AND VENTRICLES: There is no evidence of an acute infarct, mass,  midline shift, hydrocephalus, or extra-axial fluid collection. Intrinsic T1 hyperintensity is again noted in the globi palladi consistent with known chronic liver disease. Cerebral volume is within normal limits for age. There are small chronic infarcts in the right thalamus, left occipital lobe, and left cerebellar hemisphere with associated chronic blood products in the left occipital lobe. Patchy T2 hyperintensities in the cerebral white matter bilaterally have mildly progressed from the prior MRI and are nonspecific but compatible with moderate chronic small vessel ischemic disease. Major intracranial vascular flow voids are preserved. ORBITS: Bilateral cataract extraction. SINUSES AND MASTOIDS: Minimal mucosal thickening in the paranasal sinuses. Clear mastoid air cells. BONES AND SOFT TISSUES: Normal marrow signal. No significant soft tissue abnormality. IMPRESSION: 1. No acute intracranial abnormality. 2. Moderate chronic small vessel ischemic disease with chronic posterior circulation infarcts as above. Electronically signed by: Dasie Hamburg MD MD 12/02/2024 06:07 PM EST RP Workstation: [id number]    Lab Results: Recent Labs    12/02/24 0306 12/03/24 0302 12/04/24 0255  WBC 2.4* 2.2* 2.4*  HGB 8.1* 7.8* 7.2*  HCT 24.7* 24.6* 21.9*  PLT 99* 107* 101*   BMET Recent Labs    12/02/24 0306 12/03/24 0302 12/04/24 0255  NA 141 142 140  K 4.2 4.4 4.5  CL 116* 118* 113*  CO2 19* 18* 19*  GLUCOSE 99 67* 81  BUN 12 11 12   CREATININE 0.99 0.86 1.05*  CALCIUM  8.7* 8.5* 8.8*   LFT Recent Labs    12/04/24 0255  PROT 4.6*  ALBUMIN  2.3*  AST 53*  ALT 30  ALKPHOS 144*  BILITOT 0.7  PT/INR Recent Labs    12/02/24 1231  LABPROT 16.9*  INR 1.3*     Scheduled Meds:  Chlorhexidine  Gluconate Cloth  6 each Topical Daily   feeding supplement  1 Container Oral TID BM   Gerhardt's butt cream   Topical BID   heparin   5,000 Units Subcutaneous Q8H   lactulose   40 g Oral TID    levothyroxine   50 mcg Oral Q0600   pantoprazole  (PROTONIX ) IV  40 mg Intravenous Q24H   rifaximin   550 mg Oral BID   Continuous Infusions:  sodium chloride  100 mL/hr at 12/04/24 0853   thiamine  (VITAMIN B1) injection Stopped (12/04/24 9178)      Patient profile:   74 year old female with oxaliplatin induced noncirrhotic portal hypertension with 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  Improved s/p lactulose  enemas 3 bowel movements documented since yesterday 1/11 - Continue lactulose  40G 3 times daily, titrate 3-4 loose bowel movements (patient currently has rectal tube) - Continue Xifaxan  550 mg twice daily - Continue to document bowel movements - Continue IVF per hospitalist - Diet as tolerated  Pancytopenia likely secondary to cirrhosis WBC 2.4, Hgb 7.2, platelets 101 - Continue daily CBC and transfuse as needed to maintain HGB > 7   AKI CR 1.05, BUN 12, GFR 56, improving  Embolic CVA 11/2023 On ASA  History of GERD, gastric ulcer. 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

## 2024-12-04 NOTE — Progress Notes (Signed)
 " PROGRESS NOTE [name]  FMW:[id number] DOB: 1951-10-11 DOA: 11/28/2024 PCP: [name], NP  Brief Narrative/Hospital Course: [name] is a 74 y.o. female with PMH of  cirrhosis 2/2 chemotherapy s/p TIPS c/b hepatic encephalopathy, colon cancer s/p hemicolectomy, CVA, IDA, hypothyroidism, HTN and gastritis who presented to the ED for evaluation of altered mental status. As per spouse, patient is prescribed 3 teaspoons of lactulose  4 times a day however he has been doing 3 times a day with good 2-3 bowel movements a day. Synthroid  dose was reduced from 75 to 50 mcg 3 weeks ago.  Over the last week, he has noticed patient has had decreased bowel movement on her current lactulose  dose. Over the last few days, she has had mild tremor and has become more sleepy but has been able to have conversations as usual  but on 1/6 am patient w/ significant amount of stool and altered more than her baseline so he presented her to the ED for further evaluation.  ED Course: Initial vitals show patient afebrile and normotensive. Initial labs significant for K+ 5.3, BUNs/creatinine 32/1.53, AST/ALT 79/49, alk phos 228, ammonia 222, WBC 5.0, Hgb 10.8, platelet 120, glucose 84. CXR shows cardiomegaly but no active disease. CT head with no acute intracranial abnormality.  Patient given IV fluids and admitted w/ lactulose  enema.  Subjective: Seen and examined This morning oriented conversant back to baseline eating well asking for regular diet Husband at the bedside Overnight patient blood pressure remains soft, temperature stable, labs shows hemoglobin slightly downtrending also leukopenia platelets stable in 100, creatinine 1 point BM 800 cc in FMS. Changed to 60 cc lactulose . Has not been OOB  Assessment and plan:  Acute hepatic encephalopathy Liver cirrhosis secondary to chemo status post TIPS History of esophageal varices and portal hypertension: HE w/ decreasing bowel movement despite being on  lactulose .No other obvious trigger.CT head/ MRI brain NAD In ED ammonia level 222>resovled. WBC stable, UA unremarkable. Blood culture sent 1/8- NGTD. Mental status fluctuating but has been doing well now over 2 days.  Also placed high-dose b1 on 1/10 Advance diet to regular diet OOB PT OT.  Enema changed to lactulose  40 g 3 times daily Appreciate GI input.  Titrate lactulose  for BM 3-4 GI following closely. Cont lactulose  w/ goals of 3-4 BM/day will uptitrate, cont rifaximin   Hypothermia:resolved Transferred to stepdown 1/7 overnight for [employer]No obvious signs of infection.Also anemia and hypoglycemia Cortisol stable 10.5. she has had recurrent issues w/ hypothermia on chart review back in July and August. Suspected her cirrhosis, poor oral intake hypoglycemia contributing.  Urine retention: cont Foley catheter, and IVF due to poor output and increasing. Stop IV fluids once tolerating p.o. TOV once more mobile today   Hypoglycemia: Resolved  GERD. Cont ppi  Hypothyroidism Recently Synthroid  dose changed 75 mcg> no 50 mcg in mid December-Repeat TSH 1.33.ft4 ok.  AKI Mild metabolic acidosis Hyperkalemia-resolved Hyperchloremia: Electrolytes stable.  Monitor  Pancytopenia: Due to her liver cirrhosis.Trend labs-transfuse if Hb< 7 or platelet <10.   CVA hx HTN: BP stable. Po meds on hold pending improvement in mental status   GERD: Continue PPI  GOC: Patient has significant medical comorbidities at high risk for recurrent admission, decompensation, consulted PMT. She is full code at this time with full scope of treatment although remains at high risk for readmissions decompensation overall prognosis not bright on long-term  Mobility: PT Orders: Active PT Follow up Rec: Outpatient Pt (Pt/Spouse Preference)11/30/2024 1600  DVT prophylaxis: heparin  injection 5,000 Units Start: 11/28/24 1730 Code Status:   Code Status: Full Code Family Communication: plan of care  discussed with patient and her husband at bedside Patient status is: Remains hospitalized because of severity of illness Level of care: Med-Surg   Dispo: The patient is from: Home with husband            Anticipated disposition: Anticipating discharge in next 24 hours  Objective: Vitals last 24 hrs: Vitals:   12/04/24 0700 12/04/24 0743 12/04/24 0800 12/04/24 0900  BP: (!) 98/41  (!) 95/39 (!) 92/34  Pulse: (!) 58  (!) 58 65  Resp: 10  14 16   Temp: (!) 95.7 F (35.4 C) 97.6 F (36.4 C)    TempSrc: Bladder Axillary    SpO2: 94%  95% 95%  Weight:      Height:       Physical Examination: General exam: AAOX3, pleasant and conversant HEENT:Oral mucosa moist, Ear/Nose WNL grossly Respiratory system: Bilaterally clear BS, no use of accessory muscle Cardiovascular system: S1 & S2 +, No JVD. Gastrointestinal system: Abdomen soft, nontender nondistended ,BS+, fms+ Nervous System: Alert, awake oriented and conversant, follows commands  Extremities: extremities warm, leg edema trace Skin: Warm, no rashes MSK: Normal muscle bulk,tone, power   Medications reviewed:  Scheduled Meds:  Chlorhexidine  Gluconate Cloth  6 each Topical Daily   feeding supplement  1 Container Oral TID BM   Gerhardt's butt cream   Topical BID   heparin   5,000 Units Subcutaneous Q8H   lactulose   40 g Oral TID   levothyroxine   50 mcg Oral Q0600   pantoprazole  (PROTONIX ) IV  40 mg Intravenous Q24H   rifaximin   550 mg Oral BID   Continuous Infusions:  thiamine  (VITAMIN B1) injection Stopped (12/04/24 9178)    Diet: Diet Order             Diet regular Room service appropriate? Yes; Fluid consistency: Thin  Diet effective now                    Data Reviewed: I have personally reviewed following labs and imaging studies ( see epic result tab) CBC: Recent Labs  Lab 11/28/24 1442 11/29/24 0411 11/30/24 0320 12/01/24 0324 12/02/24 0306 12/03/24 0302 12/04/24 0255  WBC 5.0   < > 2.0* 3.1* 2.4*  2.2* 2.4*  NEUTROABS 3.7  --   --  1.7 1.3*  --   --   HGB 10.8*   < > 8.5* 8.5* 8.1* 7.8* 7.2*  HCT 32.8*   < > 25.4* 26.2* 24.7* 24.6* 21.9*  MCV 104.8*   < > 106.3* 107.8* 108.3* 110.3* 109.5*  PLT 120*   < > 97* 113* 99* 107* 101*   < > = values in this interval not displayed.   CMP: Recent Labs  Lab 11/30/24 0320 12/01/24 0324 12/02/24 0306 12/03/24 0302 12/04/24 0255  NA 144 140 141 142 140  K 4.6 4.0 4.2 4.4 4.5  CL 116* 114* 116* 118* 113*  CO2 20* 17* 19* 18* 19*  GLUCOSE 66* 86 99 67* 81  BUN 24* 19 12 11 12   CREATININE 1.13* 1.16* 0.99 0.86 1.05*  CALCIUM  8.8* 8.9 8.7* 8.5* 8.8*   GFR: Estimated Creatinine Clearance: 41.2 mL/min (A) (by C-G formula based on SCr of 1.05 mg/dL (H)). Recent Labs  Lab 11/28/24 1442 11/29/24 0411 11/30/24 0320 12/04/24 0255  AST 79* 63* 63* 53*  ALT 49* 39 36 30  ALKPHOS 228*  196* 154* 144*  BILITOT 1.8* 1.5* 1.1 0.7  PROT 6.6 5.8* 4.7* 4.6*  ALBUMIN  3.3* 3.0* 2.6* 2.3*   No results for input(s): LIPASE, AMYLASE in the last 168 hours.  Recent Labs  Lab 11/28/24 1442 11/30/24 0320 12/01/24 0324 12/02/24 0306  AMMONIA 222* 43* 32 33   Coagulation Profile:  Recent Labs  Lab 11/29/24 0411 12/01/24 0324 12/02/24 1231  INR 1.4* 1.4* 1.3*   Unresulted Labs (From admission, onward)     Start     Ordered   12/02/24 1334  Vitamin B1  Add-on,   AD       Question:  Specimen collection method  Answer:  Lab=Lab collect   12/02/24 1333           Antimicrobials/Microbiology: Anti-infectives (From admission, onward)    Start     Dose/Rate Route Frequency Ordered Stop   11/30/24 1000  rifaximin  (XIFAXAN ) tablet 550 mg        550 mg Oral 2 times daily 11/30/24 0914           Component Value Date/Time   SDES  11/30/2024 9057    BLOOD LEFT HAND Performed at [facility], [address], [place], [place] [address]    SPECREQUEST  11/30/2024 [phone]    BOTTLES DRAWN AEROBIC ONLY Blood Culture results may not be  optimal due to an inadequate volume of blood received in culture bottles Performed at [facility], [address] [place], [place] [address]    CULT  11/30/2024 [phone]    NO GROWTH 4 DAYS Performed at [facility], [address], [place], [place] [address]    REPTSTATUS PENDING 11/30/2024 9057    Procedures:   Mennie LAMY, MD [facility] 12/04/2024, 10:08 AM   "

## 2024-12-04 NOTE — Progress Notes (Signed)
 "                                                                                                                                                                                                          Daily Progress Note   Patient Name: [name]       Date: 12/04/2024 DOB: 06-13-1951  Age: 74 y.o. MRN#: [id number] Attending Physician: Christobal Guadalajara, MD Primary Care Physician: [name], NP Admit Date: 11/28/2024  Reason for Consultation/Follow-up: Establishing goals of care  Subjective:  Awake alert, sitting in bed, husband at bedside, no distress, denies complaints, states she is feeling better.   Length of Stay: 6  Current Medications: Scheduled Meds:   Chlorhexidine  Gluconate Cloth  6 each Topical Daily   feeding supplement  1 Container Oral TID BM   Gerhardt's butt cream   Topical BID   heparin   5,000 Units Subcutaneous Q8H   lactulose   40 g Oral TID   levothyroxine   50 mcg Oral Q0600   pantoprazole  (PROTONIX ) IV  40 mg Intravenous Q24H   rifaximin   550 mg Oral BID    Continuous Infusions:  thiamine  (VITAMIN B1) injection Stopped (12/04/24 0821)    PRN Meds: acetaminophen  **OR** acetaminophen , artificial tears, bisacodyl , Muscle Rub, ondansetron  **OR** ondansetron  (ZOFRAN ) IV, mouth rinse, senna-docusate  Physical Exam         Awake alert resting in bed Generalized weakness appears chronically ill Monitor noted No focal deficits Mood and affect within normal limits answers all questions appropriately follows commands no asterixis noted Abdomen not distended  Vital Signs: BP (!) 104/45   Pulse 62   Temp 97.6 F (36.4 C) (Axillary)   Resp 15   Ht 5' 4 (1.626 m)   Wt 64.3 kg   SpO2 90%   BMI 24.33 kg/m  SpO2: SpO2: 90 % O2 Device: O2 Device: Room Air O2 Flow Rate:    Intake/output summary:  Intake/Output Summary (Last 24 hours) at 12/04/2024 1409 Last data filed at 12/04/2024 1300 Gross per 24 hour  Intake 1767.75 ml  Output 1620 ml  Net 147.75 ml    LBM: Last BM Date : 12/04/24 Baseline Weight: Weight: 68 kg Most recent weight: Weight: 64.3 kg       Palliative Assessment/Data:      Patient Active Problem List   Diagnosis Date Noted   AMS (altered mental status) 07/01/2024   Heme positive stool 05/28/2024   Acute gastric ulcer without hemorrhage or perforation 05/28/2024   Gastritis and gastroduodenitis 05/28/2024   Acute on chronic  anemia 05/27/2024   SIRS (systemic inflammatory response syndrome) (HCC) 05/21/2024   Acute prerenal azotemia 05/21/2024   Paroxysmal atrial fibrillation (HCC) 05/21/2024   Acute metabolic encephalopathy 05/20/2024   Vision disturbance 05/11/2024   Patent foramen ovale 04/16/2024   Acute CVA (cerebrovascular accident) (HCC) 04/15/2024   Abnormal CBC 04/10/2024   Hyperkalemia 04/05/2024   Hypoglycemia 04/05/2024   Macrocytic anemia 04/05/2024   Cerebrovascular accident (CVA) (HCC) 03/16/2024   History of stroke 01/06/2024   Thrombocytopenia 12/21/2023   Decreased GFR 12/10/2023   Preventative health care 09/09/2023   Lower extremity edema 08/10/2023   Cellulitis of both lower extremities 08/04/2023   Altered mental status 08/04/2023   Bradycardia 05/07/2023   Protein-calorie malnutrition, severe 02/26/2023   Weight loss 02/24/2023   Other constipation 02/24/2023   Generalized abdominal pain 02/24/2023   Hospital discharge follow-up 02/24/2023   Weakness 01/04/2023   Confusion 08/26/2022   Frequent UTI 05/18/2022   Anemia of chronic disease 05/18/2022   AKI (acute kidney injury) 04/30/2022   Tachycardia-bradycardia syndrome (HCC) 02/05/2022   Hypomagnesemia 02/04/2022   Hepatic encephalopathy (HCC) 02/03/2022   Basal cell carcinoma (BCC) 05/06/2021   Hypertension 05/06/2021   Cirrhosis (HCC) 12/04/2020   Murmur, cardiac 03/19/2020   Acquired hypothyroidism 06/30/2019   History of basal cell cancer 06/30/2019   Hx of colon cancer, stage III 11/30/2011   Portal hypertension  (HCC) 01/23/2008    Palliative Care Assessment & Plan  1.  Recurrent hepatic encephalopathy in the setting of MASH cirrhosis status post TIPS Patient with oxaliplatin induced noncirrhotic portal hypertension and cirrhosis admitted with severe hepatic encephalopathy.  GI colleagues following closely, remains admitted to hospital medicine service, ammonia has improved and clinical resolution also likely taking effect as patient noted to be alert and oriented without asterixis, following commands, remains on lactulose  and bowel movements are being tracked and diet being advanced as tolerated.  Also on IV fluids. 2.  Pancytopenia likely secondary to chronic liver disease, acute kidney injury, history of embolic CVA, history of GERD and gastric ulcer disease for which the patient is on PPIs.  Also on aspirin .  Blood work is being tracked serially.  Creatinine 1.05, BUN 12 showing improvement. Recommend continuing supportive care and monitoring as per primary team and GI recommendations 3.Goals of care/palliative involvement Patient with advanced chronic liver disease and recurrent hospitalizations, hepatic encephalopathy placing her at ongoing risk for further decompensation and functional decline.  CODE STATUS full code for now. Recommend outpatient palliative referral at discharge for longitudinal symptom management, support with complex chronic illness and further goals of care discussions as disease trajectory evolves.     Goals of Care and Additional Recommendations: Limitations on Scope of Treatment: Full Scope Treatment  Code Status:    Code Status Orders  (From admission, onward)           Start     Ordered   11/28/24 1726  Full code  Continuous       Question:  By:  Answer:  Consent: discussion documented in EHR   11/28/24 1726           Code Status History     Date Active Date Inactive Code Status Order ID Comments User Context   07/01/2024 0439 07/03/2024 1652 Full Code  [id number]  Shona Terry SAILOR, DO ED   06/19/2024 1844 06/20/2024 2345 Full Code [id number]  Darci Pore, MD ED   06/07/2024 1647 06/10/2024 1604 Full Code [id number]  Sonjia Held, MD ED  05/20/2024 2133 05/29/2024 1804 Full Code [id number]  Marcene Eva NOVAK, DO ED   04/15/2024 1435 04/16/2024 2226 Full Code [id number]  Seena Marsa NOVAK, MD ED   03/30/2024 1840 04/05/2024 1836 Full Code [id number]  Arlice Reichert, MD ED   12/21/2023 1729 12/25/2023 1957 Full Code [id number]  Celinda Alm Lot, MD ED   04/21/2023 [phone] 04/27/2023 2056 Full Code [id number]  Kassie Acquanetta Bradley, MD ED   02/26/2023 0034 03/13/2023 1642 Full Code [id number]  Shona Terry SAILOR, DO Inpatient   05/01/2022 0256 05/05/2022 2014 Full Code [id number]  Rockey Denece LABOR, DO ED   02/02/2022 2128 02/05/2022 1612 Full Code [id number]  Seena Marsa NOVAK, MD ED   11/28/2020 1614 11/30/2020 2005 Full Code [id number]  Caleen Qualia, MD ED   08/12/2020 2328 08/18/2020 0130 Full Code [id number]  Charlton Evalene RAMAN, MD ED   03/12/2020 0343 03/15/2020 0119 Full Code [id number]  Alfornia Madison, MD ED       Prognosis:  Unable to determine  Discharge Planning: Recommend outpatient palliative support after discharge.   Care plan was discussed with patient and husband at bedside.   Thank you for allowing the Palliative Medicine Team to assist in the care of this patient. I personally spent a total of 35 minutes in the care of the patient today including preparing to see the patient, getting/reviewing separately obtained history, performing a medically appropriate exam/evaluation, counseling and educating, documenting clinical information in the EHR, and communicating results.      Greater than 50%  of this time was spent counseling and coordinating care related to the above assessment and plan.  Lonia Serve, MD  Please contact Palliative Medicine Team phone at [phone] for questions and concerns.       "

## 2024-12-04 NOTE — Plan of Care (Signed)
   Problem: Clinical Measurements: Goal: Respiratory complications will improve Outcome: Progressing Goal: Cardiovascular complication will be avoided Outcome: Progressing   Problem: Activity: Goal: Risk for activity intolerance will decrease Outcome: Progressing   Problem: Nutrition: Goal: Adequate nutrition will be maintained Outcome: Progressing

## 2024-12-05 DIAGNOSIS — K7682 Hepatic encephalopathy: Secondary | ICD-10-CM | POA: Diagnosis not present

## 2024-12-05 LAB — CULTURE, BLOOD (ROUTINE X 2)
Culture: NO GROWTH
Culture: NO GROWTH

## 2024-12-05 LAB — CBC
HCT: 22 % — ABNORMAL LOW (ref 36.0–46.0)
Hemoglobin: 7.2 g/dL — ABNORMAL LOW (ref 12.0–15.0)
MCH: 35.8 pg — ABNORMAL HIGH (ref 26.0–34.0)
MCHC: 32.7 g/dL (ref 30.0–36.0)
MCV: 109.5 fL — ABNORMAL HIGH (ref 80.0–100.0)
Platelets: 105 K/uL — ABNORMAL LOW (ref 150–400)
RBC: 2.01 MIL/uL — ABNORMAL LOW (ref 3.87–5.11)
RDW: 20.3 % — ABNORMAL HIGH (ref 11.5–15.5)
WBC: 3 K/uL — ABNORMAL LOW (ref 4.0–10.5)
nRBC: 0 % (ref 0.0–0.2)

## 2024-12-05 LAB — BASIC METABOLIC PANEL WITH GFR
Anion gap: 6 (ref 5–15)
BUN: 11 mg/dL (ref 8–23)
CO2: 19 mmol/L — ABNORMAL LOW (ref 22–32)
Calcium: 8.5 mg/dL — ABNORMAL LOW (ref 8.9–10.3)
Chloride: 113 mmol/L — ABNORMAL HIGH (ref 98–111)
Creatinine, Ser: 1.25 mg/dL — ABNORMAL HIGH (ref 0.44–1.00)
GFR, Estimated: 45 mL/min — ABNORMAL LOW
Glucose, Bld: 89 mg/dL (ref 70–99)
Potassium: 3.9 mmol/L (ref 3.5–5.1)
Sodium: 138 mmol/L (ref 135–145)

## 2024-12-05 LAB — HEMOGLOBIN AND HEMATOCRIT, BLOOD
HCT: 26.6 % — ABNORMAL LOW (ref 36.0–46.0)
Hemoglobin: 9.2 g/dL — ABNORMAL LOW (ref 12.0–15.0)

## 2024-12-05 LAB — PREPARE RBC (CROSSMATCH)

## 2024-12-05 MED ORDER — SODIUM CHLORIDE 0.9 % IV SOLN: INTRAVENOUS | Status: DC

## 2024-12-05 MED ORDER — SODIUM CHLORIDE 0.9% IV SOLUTION: Freq: Once | INTRAVENOUS | Status: AC

## 2024-12-05 NOTE — Plan of Care (Signed)
  Problem: Clinical Measurements: Goal: Diagnostic test results will improve Outcome: Progressing Goal: Respiratory complications will improve Outcome: Progressing Goal: Cardiovascular complication will be avoided Outcome: Progressing   Problem: Nutrition: Goal: Adequate nutrition will be maintained Outcome: Progressing   Problem: Elimination: Goal: Will not experience complications related to bowel motility Outcome: Progressing Goal: Will not experience complications related to urinary retention Outcome: Progressing

## 2024-12-05 NOTE — Progress Notes (Signed)
 " PROGRESS NOTE [name]  FMW:[id number] DOB: [birth year] DOA: 11/28/2024 PCP: [name], NP  Brief Narrative/Hospital Course: [name] is a 74 y.o. female with PMH of  cirrhosis 2/2 chemotherapy s/p TIPS c/b hepatic encephalopathy, colon cancer s/p hemicolectomy, CVA, IDA, hypothyroidism, HTN and gastritis who presented to the ED for evaluation of altered mental status. As per spouse, patient is prescribed 3 teaspoons of lactulose  4 times a day however he has been doing 3 times a day with good 2-3 bowel movements a day. Synthroid  dose was reduced from 75 to 50 mcg 3 weeks ago.  Over the last week, he has noticed patient has had decreased bowel movement on her current lactulose  dose. Over the last few days, she has had mild tremor and has become more sleepy but has been able to have conversations as usual  but on 1/6 am patient w/ significant amount of stool and altered more than her baseline so he presented her to the ED for further evaluation.  ED Course: Initial vitals show patient afebrile and normotensive. Initial labs significant for K+ 5.3, BUNs/creatinine 32/1.53, AST/ALT 79/49, alk phos 228, ammonia 222, WBC 5.0, Hgb 10.8, platelet 120, glucose 84. CXR shows cardiomegaly but no active disease. CT head with no acute intracranial abnormality. Patient given IV fluids and admitted w/ lactulose  enema. Mental status was fluctuating, MRI brain unremarkable.  Seen by GI, continued on rifaximin  subsequently able to take p.o. lactulose  and dose titrated.  Subjective: Seen and examined Aaox3, weak frail. Had BM this am drinking now Overnight temperature PRN fluctuating BP relatively soft 80s-90s, labs reviewed pancytopenia hemoglobin around 7.2, creatinine trending up 1.2  Assessment and plan:  Acute hepatic encephalopathy Liver cirrhosis secondary to chemo status post TIPS History of esophageal varices and portal hypertension: HE w/ decreasing bowel movement despite being on  lactulose .Initial ammonia level 222 WBC stable, UA unremarkable. Blood culture 1/8- NGTD,CT head/ MRI brain NAD.No other obvious trigger Mental status fluctuating started on high-dose b1 on 1/10, mentation improved  Cont  regular diet OOB PT OT.  Enema changed to lactulose  40 g  tid 1/12-  Titrate lactulose  for BM ~3 Appreciate GI input. Cont rifaximin   Hypotension: At baseline soft BP and in the past was on midodrine  and was weaned off. Doing 1 u prbc and started midodrine - wean off ivf.  Pancytopenia: Due to her liver cirrhosis.Trend labs-baseline hemoglobin around 10, was cose to 6.8 in July, she is weak and symptomatic anemia - will transfuse 1 u prbc . Recent Labs  Lab 12/03/24 0302 12/04/24 0255 12/05/24 0308  HGB 7.8* 7.2* 7.2*  HCT 24.6* 21.9* 22.0*  WBC 2.2* 2.4* 3.0*  PLT 107* 101* 105*   Hypothermia: Transferred to stepdown 1/7 overnight for [employer]No obvious signs of infection. BUT W/ anemia and hypoglycemia Cortisol stable 10.5. she has had recurrent issues w/ hypothermia on chart review back in July and August. Suspected her cirrhosis, poor oral intake contributing.  Urine retention: Off foley catheter.  Hypoglycemia: Resolved.  GERD: Cont ppi  Hypothyroidism: Recently Synthroid  dose changed 75 mcg>50 mcg in mid December-Repeat TSH 1.33.ft4 ok.  AKI Mild metabolic acidosis Hyperkalemia-resolved Hyperchloremia: AKI with creatinine trending up, given softer blood pressure placed on midodrine , give gentle IV fluids encourage p.o. intake monitor electrolytes Home Lasix  and aldactone  on hold for now  CVA hx HTN: BP stable.Po meds on hold as BP soft  GERD: Continue PPI  GOC: Patient has significant medical comorbidities at high risk for recurrent  admission, decompensation, consulted PMT. She is full code at this time with full scope of treatment although remains at high risk Overall prognosis not bright on long-term  Mobility: PT Orders:  Active PT Follow up Rec: Outpatient Pt (Pt/Spouse Preference)11/30/2024 1600    DVT prophylaxis: heparin  injection 5,000 Units Start: 11/28/24 1730 Code Status:   Code Status: Full Code Family Communication: plan of care discussed with patient and her husband at bedside Patient status is: Remains hospitalized because of severity of illness Level of care: Stepdown   Dispo: The patient is from: Home with husband            Anticipated disposition: tbd Objective: Vitals last 24 hrs: Vitals:   12/05/24 0400 12/05/24 0447 12/05/24 0600 12/05/24 0800  BP: (!) 93/43 (!) 96/39 (!) 91/36 (!) 92/33  Pulse: 64 (!) 59 64 63  Resp: 15 13 14 14   Temp:  (!) 97.4 F (36.3 C)  98.2 F (36.8 C)  TempSrc:  Oral  Oral  SpO2: 92% 93% 96% 96%  Weight:      Height:       Physical Examination: General exam: AAOX3  weak HEENT:Oral mucosa moist, Ear/Nose WNL grossly Respiratory system: Bilaterally clear B Cardiovascular system: S1 & S2 +, No JVD. Gastrointestinal system: Abdomen soft, nontender nondistended ,BS+, fms+ Nervous System: Alert, awake oriented and conversant, follows commands  Extremities: extremities warm, leg edema mild Skin: Warm, no rashes MSK: Normal muscle bulk,tone, power   Medications reviewed:  Scheduled Meds:  sodium chloride    Intravenous Once   Chlorhexidine  Gluconate Cloth  6 each Topical Daily   feeding supplement  1 Container Oral TID BM   Gerhardt's butt cream   Topical BID   heparin   5,000 Units Subcutaneous Q8H   lactulose   40 g Oral TID   levothyroxine   50 mcg Oral Q0600   midodrine   10 mg Oral TID WC   pantoprazole  (PROTONIX ) IV  40 mg Intravenous Q24H   rifaximin   550 mg Oral BID   Continuous Infusions:  sodium chloride  75 mL/hr at 12/05/24 0853   sodium chloride  Stopped (12/05/24 0148)    Diet: Diet Order             Diet regular Room service appropriate? Yes; Fluid consistency: Thin  Diet effective now                    Data Reviewed: I have  personally reviewed following labs and imaging studies ( see epic result tab) CBC: Recent Labs  Lab 11/28/24 1442 11/29/24 0411 12/01/24 0324 12/02/24 0306 12/03/24 0302 12/04/24 0255 12/05/24 0308  WBC 5.0   < > 3.1* 2.4* 2.2* 2.4* 3.0*  NEUTROABS 3.7  --  1.7 1.3*  --   --   --   HGB 10.8*   < > 8.5* 8.1* 7.8* 7.2* 7.2*  HCT 32.8*   < > 26.2* 24.7* 24.6* 21.9* 22.0*  MCV 104.8*   < > 107.8* 108.3* 110.3* 109.5* 109.5*  PLT 120*   < > 113* 99* 107* 101* 105*   < > = values in this interval not displayed.   CMP: Recent Labs  Lab 12/01/24 0324 12/02/24 0306 12/03/24 0302 12/04/24 0255 12/05/24 0308  NA 140 141 142 140 138  K 4.0 4.2 4.4 4.5 3.9  CL 114* 116* 118* 113* 113*  CO2 17* 19* 18* 19* 19*  GLUCOSE 86 99 67* 81 89  BUN 19 12 11 12 11   CREATININE 1.16* 0.99  0.86 1.05* 1.25*  CALCIUM  8.9 8.7* 8.5* 8.8* 8.5*   GFR: Estimated Creatinine Clearance: 34.6 mL/min (A) (by C-G formula based on SCr of 1.25 mg/dL (H)). Recent Labs  Lab 11/28/24 1442 11/29/24 0411 11/30/24 0320 12/04/24 0255  AST 79* 63* 63* 53*  ALT 49* 39 36 30  ALKPHOS 228* 196* 154* 144*  BILITOT 1.8* 1.5* 1.1 0.7  PROT 6.6 5.8* 4.7* 4.6*  ALBUMIN  3.3* 3.0* 2.6* 2.3*   No results for input(s): LIPASE, AMYLASE in the last 168 hours.  Recent Labs  Lab 11/28/24 1442 11/30/24 0320 12/01/24 0324 12/02/24 0306  AMMONIA 222* 43* 32 33   Coagulation Profile:  Recent Labs  Lab 11/29/24 0411 12/01/24 0324 12/02/24 1231  INR 1.4* 1.4* 1.3*   Unresulted Labs (From admission, onward)     Start     Ordered   12/05/24 0923  Prepare RBC (crossmatch)  (Blood Administration Adult)  Once,   R       Question Answer Comment  # of Units 1 unit   Transfusion Indications Other   Comments symptomatic anemia   Number of Units to Keep Ahead NO units ahead     Placed in And Linked Group   12/05/24 0922   12/02/24 1334  Vitamin B1  Add-on,   AD       Question:  Specimen collection method  Answer:   Lab=Lab collect   12/02/24 1333           Antimicrobials/Microbiology: Anti-infectives (From admission, onward)    Start     Dose/Rate Route Frequency Ordered Stop   11/30/24 1000  rifaximin  (XIFAXAN ) tablet 550 mg        550 mg Oral 2 times daily 11/30/24 0914           Component Value Date/Time   SDES  11/30/2024 9057    BLOOD LEFT HAND Performed at [facility], [address] [place], [place] [address]    SPECREQUEST  11/30/2024 [phone]    BOTTLES DRAWN AEROBIC ONLY Blood Culture results may not be optimal due to an inadequate volume of blood received in culture bottles Performed at [facility], [address], [place], [place] [address]    CULT  11/30/2024 [phone]    NO GROWTH 4 DAYS Performed at [facility], [address], [place], [place] [address]    REPTSTATUS PENDING 11/30/2024 9057    Procedures:   Mennie LAMY, MD [facility] 12/05/2024, 9:26 AM   "

## 2024-12-05 NOTE — Progress Notes (Signed)
 "   Progress Note   LOS: 7 days   Chief Complaint: Hepatic encephalopathy    Subjective   Husband in room at the time patient.  Patient continues to improve and worked with PT this morning without difficulty.  Had a bowel movement this morning.  Alert and oriented with no complaints.  Tolerating diet without difficulty.  No asterixis on exam.   Objective   Vital signs in last 24 hours: Temp:  [94.7 F (34.8 C)-98.2 F (36.8 C)] 96.5 F (35.8 C) (01/13 1109) Pulse Rate:  [50-66] 50 (01/13 1200) Resp:  [12-21] 14 (01/13 1200) BP: (87-113)/(31-77) 108/46 (01/13 1200) SpO2:  [90 %-96 %] 95 % (01/13 1200) Last BM Date : 12/05/24 Last BM recorded by nurses in past 5 days Stool Type: Type 6 (Mushy consistency with ragged edges) (12/05/2024  8:00 AM)  General:   female in no acute distress, sitting in chair Heart:  Regular rate and rhythm; no murmurs Pulm: Clear anteriorly; no wheezing Abdomen: soft, nondistended, normal bowel sounds in all quadrants. Nontender without guarding. No organomegaly appreciated. Extremities:  No edema Neurologic:  Alert and  oriented x4;  No focal deficits.  Psych:  Cooperative. Normal mood and affect.  Intake/Output from previous day: 01/12 0701 - 01/13 0700 In: 1085.2 [P.O.:260; I.V.:167.2; IV Piggyback:658] Out: 470 [Urine:470] Intake/Output this shift: Total I/O In: 88 [I.V.:34; IV Piggyback:54] Out: -   Studies/Results: No results found.  Lab Results: Recent Labs    12/03/24 0302 12/04/24 0255 12/05/24 0308  WBC 2.2* 2.4* 3.0*  HGB 7.8* 7.2* 7.2*  HCT 24.6* 21.9* 22.0*  PLT 107* 101* 105*   BMET Recent Labs    12/03/24 0302 12/04/24 0255 12/05/24 0308  NA 142 140 138  K 4.4 4.5 3.9  CL 118* 113* 113*  CO2 18* 19* 19*  GLUCOSE 67* 81 89  BUN 11 12 11   CREATININE 0.86 1.05* 1.25*  CALCIUM  8.5* 8.8* 8.5*   LFT Recent Labs    12/04/24 0255  PROT 4.6*  ALBUMIN  2.3*  AST 53*  ALT 30  ALKPHOS 144*  BILITOT 0.7    PT/INR Recent Labs    12/02/24 1231  LABPROT 16.9*  INR 1.3*     Scheduled Meds:  Chlorhexidine  Gluconate Cloth  6 each Topical Daily   feeding supplement  1 Container Oral TID BM   Gerhardt's butt cream   Topical BID   heparin   5,000 Units Subcutaneous Q8H   lactulose   40 g Oral TID   levothyroxine   50 mcg Oral Q0600   midodrine   10 mg Oral TID WC   pantoprazole  (PROTONIX ) IV  40 mg Intravenous Q24H   rifaximin   550 mg Oral BID   Continuous Infusions:  sodium chloride  75 mL/hr at 12/05/24 0853   sodium chloride  Stopped (12/05/24 0148)      Patient profile:   74 year old female with oxaliplatin induced noncirrhotic portal hypertension with 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  Having regular bowel movements - Continue lactulose  40G 3 times daily, titrate 2-3 loose bowel movements (patient currently has rectal tube) - Continue rifaximin  550 mg twice daily - Continue to document bowel movements - Diet as tolerated   Pancytopenia likely secondary to cirrhosis - Continue  daily CBC and transfuse as needed to maintain HGB > 7    AKI improving   Embolic CVA 11/2023 On ASA   History of GERD, gastric ulcer. 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

## 2024-12-05 NOTE — Progress Notes (Signed)
 Physical Therapy Treatment Patient Details Name: [name] MRN: [id number] DOB: [birth year] Today's Date: 12/05/2024   History of Present Illness [name] is a 74 yo female admitted with altered mental status and admitted for acute hepatic encephalopathy, RR for hypothermia  PMH: Pt with two recent hospitalizations for AMS, and acute CVA May 2025,  arthritis, colon and cecum ca, hepatic encephalopathy HTN, anemia, liver dz, falls, MASH cirrhosis, variceal hemorrhage s/p TIPS, SIRS, A-fib, hypothyroidism, CVA and AKI.    PT Comments  The patient is much more alert and improved in MS. Patient able to progress with ambulation x 30' in room using a RW. BP post ambulation 113 /53. SPO2 100%.  Spouse present. Continue PT while in acute care. SPouse prefers OPPT.    If plan is discharge home, recommend the following: A lot of help with walking and/or transfers;A little help with bathing/dressing/bathroom;Assistance with cooking/housework;Assist for transportation;Help with stairs or ramp for entrance   Can travel by private vehicle        Equipment Recommendations  None recommended by PT    Recommendations for Other Services       Precautions / Restrictions Precautions Precautions: Fall Precaution/Restrictions Comments: flexiseal Restrictions Weight Bearing Restrictions Per Provider Order: No     Mobility  Bed Mobility Overal bed mobility: Needs Assistance Bed Mobility: Supine to Sit     Supine to sit: Min assist     General bed mobility comments: assist with lines    Transfers Overall transfer level: Needs assistance Equipment used: Rolling walker (2 wheels) Transfers: Sit to/from Stand Sit to Stand: Min assist, +2 safety/equipment, From elevated surface           General transfer comment: cues for hand placement, stood with min assist,    Ambulation/Gait Ambulation/Gait assistance: Min assist, +2 safety/equipment Gait Distance (Feet): 30 Feet Assistive  device: Rolling walker (2 wheels) Gait Pattern/deviations: Step-to pattern, Decreased step length - right, Decreased step length - left, Shuffle, Trunk flexed       General Gait Details: patient advanced to short ambulation after transfer  over to recliner   Stairs             Wheelchair Mobility     Tilt Bed    Modified Rankin (Stroke Patients Only)       Balance   Sitting-balance support: Feet supported Sitting balance-Leahy Scale: Fair     Standing balance support: Bilateral upper extremity supported, During functional activity, Reliant on assistive device for balance Standing balance-Leahy Scale: Poor                              Communication Communication Communication: No apparent difficulties  Cognition Arousal: Alert Behavior During Therapy: WFL for tasks assessed/performed   PT - Cognitive impairments: Difficult to assess                       PT - Cognition Comments: much improved MS, follows directions Following commands: Intact Following commands impaired: Only follows one step commands consistently    Cueing Cueing Techniques: Verbal cues, Gestural cues, Tactile cues, Visual cues  Exercises      General Comments        Pertinent Vitals/Pain Pain Assessment Pain Assessment: No/denies pain Faces Pain Scale: No hurt    Home Living  Prior Function            PT Goals (current goals can now be found in the care plan section) Progress towards PT goals: Progressing toward goals    Frequency    Min 3X/week      PT Plan      Co-evaluation              AM-PAC PT 6 Clicks Mobility   Outcome Measure  Help needed turning from your back to your side while in a flat bed without using bedrails?: A Little Help needed moving from lying on your back to sitting on the side of a flat bed without using bedrails?: A Little Help needed moving to and from a bed to a chair  (including a wheelchair)?: A Little Help needed standing up from a chair using your arms (e.g., wheelchair or bedside chair)?: A Little Help needed to walk in hospital room?: A Lot Help needed climbing 3-5 steps with a railing? : Total 6 Click Score: 15    End of Session Equipment Utilized During Treatment: Gait belt Activity Tolerance: Patient tolerated treatment well Patient left: in chair;with call bell/phone within reach;with chair alarm set;with family/visitor present Nurse Communication: Mobility status PT Visit Diagnosis: Difficulty in walking, not elsewhere classified (R26.2);Muscle weakness (generalized) (M62.81)     Time: 9061-8993 PT Time Calculation (min) (ACUTE ONLY): 28 min  Charges:    $Gait Training: 23-37 mins PT General Charges $$ ACUTE PT VISIT: 1 Visit                     Darice Potters PT Acute Rehabilitation Services Office [phone]    Potters Darice Norris 12/05/2024, 3:08 PM

## 2024-12-06 DIAGNOSIS — K7682 Hepatic encephalopathy: Secondary | ICD-10-CM | POA: Diagnosis not present

## 2024-12-06 LAB — BPAM RBC
Blood Product Expiration Date: 202602132359
ISSUE DATE / TIME: 202601131045
Unit Type and Rh: 5100

## 2024-12-06 LAB — CBC
HCT: 27.2 % — ABNORMAL LOW (ref 36.0–46.0)
Hemoglobin: 9.2 g/dL — ABNORMAL LOW (ref 12.0–15.0)
MCH: 35.5 pg — ABNORMAL HIGH (ref 26.0–34.0)
MCHC: 33.8 g/dL (ref 30.0–36.0)
MCV: 105 fL — ABNORMAL HIGH (ref 80.0–100.0)
Platelets: 112 K/uL — ABNORMAL LOW (ref 150–400)
RBC: 2.59 MIL/uL — ABNORMAL LOW (ref 3.87–5.11)
RDW: 20.9 % — ABNORMAL HIGH (ref 11.5–15.5)
WBC: 3.5 K/uL — ABNORMAL LOW (ref 4.0–10.5)
nRBC: 0 % (ref 0.0–0.2)

## 2024-12-06 LAB — TYPE AND SCREEN
ABO/RH(D): O POS
Antibody Screen: NEGATIVE
Unit division: 0

## 2024-12-06 LAB — COMPREHENSIVE METABOLIC PANEL WITH GFR
ALT: 40 U/L (ref 0–44)
AST: 76 U/L — ABNORMAL HIGH (ref 15–41)
Albumin: 2.4 g/dL — ABNORMAL LOW (ref 3.5–5.0)
Alkaline Phosphatase: 191 U/L — ABNORMAL HIGH (ref 38–126)
Anion gap: 9 (ref 5–15)
BUN: 9 mg/dL (ref 8–23)
CO2: 17 mmol/L — ABNORMAL LOW (ref 22–32)
Calcium: 8.6 mg/dL — ABNORMAL LOW (ref 8.9–10.3)
Chloride: 113 mmol/L — ABNORMAL HIGH (ref 98–111)
Creatinine, Ser: 0.97 mg/dL (ref 0.44–1.00)
GFR, Estimated: >60 mL/min
Glucose, Bld: 74 mg/dL (ref 70–99)
Potassium: 3.9 mmol/L (ref 3.5–5.1)
Sodium: 139 mmol/L (ref 135–145)
Total Bilirubin: 0.7 mg/dL (ref 0.0–1.2)
Total Protein: 4.7 g/dL — ABNORMAL LOW (ref 6.5–8.1)

## 2024-12-06 LAB — AMMONIA: Ammonia: 114 umol/L — ABNORMAL HIGH (ref 9–35)

## 2024-12-06 LAB — PROTIME-INR
INR: 1.4 — ABNORMAL HIGH (ref 0.8–1.2)
Prothrombin Time: 18 s — ABNORMAL HIGH (ref 11.4–15.2)

## 2024-12-06 LAB — MAGNESIUM: Magnesium: 1.7 mg/dL (ref 1.7–2.4)

## 2024-12-06 LAB — VITAMIN B1: Vitamin B1 (Thiamine): 80.2 nmol/L (ref 66.5–200.0)

## 2024-12-06 NOTE — Plan of Care (Signed)
  Problem: Activity: Goal: Risk for activity intolerance will decrease Outcome: Progressing   Problem: Coping: Goal: Level of anxiety will decrease Outcome: Progressing   Problem: Pain Managment: Goal: General experience of comfort will improve and/or be controlled Outcome: Progressing   Problem: Safety: Goal: Ability to remain free from injury will improve Outcome: Progressing

## 2024-12-06 NOTE — Progress Notes (Signed)
 Okay to discontinue heart monitor per A. Alto, NP. Yolande at central telemetry notified.

## 2024-12-06 NOTE — TOC Transition Note (Signed)
 Transition of Care [facility]) - Discharge Note  Patient Details  Name: [name] MRN: [id number] Date of Birth: [birth year]  Transition [facility] CM/SW Contact:  Duwaine GORMAN Aran, LCSW Phone Number: 12/06/2024, 10:46 AM  Clinical Narrative: Patient would like to resume OPPT and spouse reported patient was going to [facility] on [address] prior to her hospital admission. CSW made OPPT referral. Care management singing off.  Final next level of care: OP Rehab Barriers to Discharge: Barriers Resolved  Patient Goals and CMS Choice Patient states their goals for this hospitalization and ongoing recovery are:: Home CMS Medicare.gov Compare Post Acute Care list provided to:: Patient Represenative (must comment) (Pt spouse) Choice offered to / list presented to : Spouse [place] ownership interest in [facility].provided to:: Spouse   Discharge Plan and Services Additional resources added to the After Visit Summary for   In-house Referral: Hospice / Palliative Care Discharge Planning Services: CM Consult Post Acute Care Choice: Durable Medical Equipment          DME Arranged: N/A DME Agency: NA HH Arranged: NA HH Agency: NA  Social Drivers of Health (SDOH) Interventions SDOH Screenings   Food Insecurity: No Food Insecurity (10/30/2024)  Housing: Low Risk (10/30/2024)  Transportation Needs: No Transportation Needs (11/28/2024)  Utilities: Not At Risk (11/28/2024)  Alcohol  Screen: Low Risk (03/27/2024)  Depression (PHQ2-9): Low Risk (05/11/2024)  Financial Resource Strain: Low Risk (10/30/2024)  Physical Activity: Insufficiently Active (10/30/2024)  Social Connections: Socially Integrated (11/28/2024)  Stress: No Stress Concern Present (10/30/2024)  Tobacco Use: Low Risk (11/28/2024)   Readmission Risk Interventions    11/30/2024    4:24 PM 07/03/2024   10:32 AM 05/29/2024   11:06 AM  Readmission Risk Prevention Plan  Transportation Screening Complete Complete Complete   Medication Review [employer]) Complete Complete Complete  PCP or Specialist appointment within 3-5 days of discharge Complete  Complete  HRI or Home Care Consult Complete Complete Complete  SW Recovery Care/Counseling Consult Complete Complete Complete  Palliative Care Screening Not Applicable Not Applicable Not Applicable  Skilled Nursing Facility Complete Not Applicable Not Applicable

## 2024-12-06 NOTE — Progress Notes (Signed)
 " PROGRESS NOTE  [name] FMW:[id number] DOB: 14-Oct-1951   PCP: [name], NP  Patient is from: Home.  Lives with husband.  Due to swelling walker at baseline.  DOA: 11/28/2024 LOS: 8  Chief complaints Chief Complaint  Patient presents with   Altered Mental Status     Brief Narrative / Interim history: 74 y.o. female with PMH of  cirrhosis 2/2 chemotherapy s/p TIPS c/b hepatic encephalopathy, colon cancer s/p hemicolectomy, CVA, IDA, hypothyroidism, HTN and gastritis presented to ED with altered mental status, tremor and decreased bowel movement, and admitted with acute hepatic encephalopathy with ammonia to 222.  As per spouse, patient is prescribed 3 teaspoons of lactulose  4 times a day however he has been doing 3 times a day with good 2-3 bowel movements a day. Synthroid  dose was reduced from 75 to 50 mcg 3 weeks ago.  Over the last week, he has noticed patient has had decreased bowel movement on her current lactulose  dose. Over the last few days, she has had mild tremor and has become more sleepy but has been able to have conversations as usual  but on 1/6 am patient w/ significant amount of stool and altered more than her baseline so he presented her to the ED for further evaluation.   In ED, stable vitals.  K5.3. Cr 1.53.  BUN 32.  AST 79.  ALT 49.  ALP 228.  Ammonia 222.  Hgb 10.8.  Platelet 120.  Glucose 84.  CXR with cardiomegaly.  CT head without acute finding.  Received IV fluid and started on lactulose  enema.  Blood culture and MRI brain ordered.  GI consulted.  MRI brain without acute finding.  Blood cultures NGTD.  Evaluated by GI and continue the lactulose  and rifaximin .  Patient's encephalopathy resolved.  However, she remained hypotensive and started on midodrine .   Subjective: Seen and examined earlier this morning.  No major events overnight or this morning.  Sitting on bedside chair eating breakfast.  Husband at bedside.  No complaints other than weakness.  Husband  is anxious to take her home due to profound weakness.   Assessment and plan: Acute hepatic encephalopathy Chemo induced liver cirrhosis s/p TIPS Esophageal varices/portal hypertension History of esophageal varices and portal hypertension: -ammonia 222 on admission and 114 today.  Encephalopathy resolved.  She is awake and alert and oriented x 4 today -Continue lactulose  40 g 3 times daily -Continue home rifaximin  -Discontinue Flexeril. -Continue PPI -Holding diuretics in the setting of hypotension  Generalized weakness/physical deconditioning: Husband said that she has not mobilized since admission.  Husband chose outpatient therapy when therapy evaluated patient.  Now he is concerned about her weakness and likes to have therapy reevaluation.  Therapy notified.   Hypotension: Likely due to liver cirrhosis.  Echocardiogram from 03/2024 without significant finding.  Normal cortisol and TSH during this hospitalization. -Hold diuretics -Continue midodrine  -Discontinue IV fluid.   Hypothermia: Transferred to stepdown 1/7 overnight for [employer] No obvious signs of infection. BUT W/ anemia and hypoglycemia. Recurrent issues w/ hypothermia on chart review back in July and August.  Hypothermia resolved.  Pancytopenia: Likely due to liver cirrhosis.  Received 1 unit on 1/13 with appropriate response. - Continue monitoring  Goal of care: Evaluated by palliative medicine.  Remains full code with full scope of care  Mild metabolic acidosis: Likely due to liver cirrhosis.  Hypothyroidism: TSH 1.33 -Continue home Synthroid .  Urine retention: Resolved.   Hypoglycemia: Resolved.   GERD: -Cont ppi  AKI: Resolved.  Hyperkalemia-resolved  Hyperchloremia:  History of CVA  GERD: Continue PPI  Body mass index is 24.33 kg/m.          DVT prophylaxis:  heparin  injection 5,000 Units Start: 11/28/24 1730  Code Status: Full code Family Communication: Updated patient's husband  at bedside Level of care: Telemetry Status is: Inpatient Remains inpatient appropriate because: Hepatic encephalopathy, generalized weakness and hypotension   Final disposition: Likely home once medically stable   55 minutes with more than 50% spent in reviewing records, counseling patient/family and coordinating care.  Consultants:  Gastroenterology Palliative medicine  Procedures: None  Microbiology summarized: Blood cultures NGTD MRSA PCR screen negative  Objective: Vitals:   12/05/24 2000 12/05/24 2056 12/06/24 0110 12/06/24 0552  BP: (!) 113/37 (!) 105/51 (!) 117/59 (!) 98/53  Pulse: (!) 51 (!) 53 (!) 56 (!) 56  Resp: 16 18 18 18   Temp: (!) (P) 97.2 F (36.2 C) (!) 97.3 F (36.3 C) (!) 97.3 F (36.3 C) (!) 97.3 F (36.3 C)  TempSrc: (P) Oral Oral Oral Oral  SpO2: 93% 96% 97% 98%  Weight:      Height:        Examination:  GENERAL: No apparent distress.  Nontoxic. HEENT: MMM.  Vision and hearing grossly intact.  NECK: Supple.  No apparent JVD.  RESP:  No IWOB.  Fair aeration bilaterally. CVS:  RRR. Heart sounds normal.  ABD/GI/GU: BS+. Abd soft, NTND.  MSK/EXT:  Moves extremities. No apparent deformity.  1+ BLE edema. SKIN: no apparent skin lesion or wound NEURO: AA.  Oriented x 4..  No apparent focal neuro deficit. PSYCH: Calm. Normal affect.   Sch Meds:  Scheduled Meds:  Chlorhexidine  Gluconate Cloth  6 each Topical Daily   feeding supplement  1 Container Oral TID BM   Gerhardt's butt cream   Topical BID   heparin   5,000 Units Subcutaneous Q8H   lactulose   40 g Oral TID   levothyroxine   50 mcg Oral Q0600   midodrine   10 mg Oral TID WC   pantoprazole  (PROTONIX ) IV  40 mg Intravenous Q24H   rifaximin   550 mg Oral BID   Continuous Infusions:  sodium chloride  Stopped (12/05/24 0148)   PRN Meds:.acetaminophen  **OR** acetaminophen , artificial tears, bisacodyl , Muscle Rub, ondansetron  **OR** ondansetron  (ZOFRAN ) IV, mouth rinse, senna-docusate, sodium  chloride  Antimicrobials: Anti-infectives (From admission, onward)    Start     Dose/Rate Route Frequency Ordered Stop   11/30/24 1000  rifaximin  (XIFAXAN ) tablet 550 mg        550 mg Oral 2 times daily 11/30/24 0914          I have personally reviewed the following labs and images: CBC: Recent Labs  Lab 12/01/24 0324 12/02/24 0306 12/03/24 0302 12/04/24 0255 12/05/24 0308 12/05/24 1535 12/06/24 0731  WBC 3.1* 2.4* 2.2* 2.4* 3.0*  --  3.5*  NEUTROABS 1.7 1.3*  --   --   --   --   --   HGB 8.5* 8.1* 7.8* 7.2* 7.2* 9.2* 9.2*  HCT 26.2* 24.7* 24.6* 21.9* 22.0* 26.6* 27.2*  MCV 107.8* 108.3* 110.3* 109.5* 109.5*  --  105.0*  PLT 113* 99* 107* 101* 105*  --  112*   BMP &GFR Recent Labs  Lab 12/02/24 0306 12/03/24 0302 12/04/24 0255 12/05/24 0308 12/06/24 0731  NA 141 142 140 138 139  K 4.2 4.4 4.5 3.9 3.9  CL 116* 118* 113* 113* 113*  CO2 19* 18* 19* 19* 17*  GLUCOSE 99 67* 81 89 74  BUN 12 11 12 11 9   CREATININE 0.99 0.86 1.05* 1.25* 0.97  CALCIUM  8.7* 8.5* 8.8* 8.5* 8.6*  MG  --   --   --   --  1.7   Estimated Creatinine Clearance: 44.6 mL/min (by C-G formula based on SCr of 0.97 mg/dL). Liver & Pancreas: Recent Labs  Lab 11/30/24 0320 12/04/24 0255 12/06/24 0731  AST 63* 53* 76*  ALT 36 30 40  ALKPHOS 154* 144* 191*  BILITOT 1.1 0.7 0.7  PROT 4.7* 4.6* 4.7*  ALBUMIN  2.6* 2.3* 2.4*   No results for input(s): LIPASE, AMYLASE in the last 168 hours. Recent Labs  Lab 11/30/24 0320 12/01/24 0324 12/02/24 0306 12/06/24 0940  AMMONIA 43* 32 33 114*   Diabetic: No results for input(s): HGBA1C in the last 72 hours. Recent Labs  Lab 11/30/24 0542 11/30/24 0623 12/01/24 2300  GLUCAP 61* 85 123*   Cardiac Enzymes: No results for input(s): CKTOTAL, CKMB, CKMBINDEX, TROPONINI in the last 168 hours. No results for input(s): PROBNP in the last 8760 hours. Coagulation Profile: Recent Labs  Lab 12/01/24 0324 12/02/24 1231  12/06/24 0940  INR 1.4* 1.3* 1.4*   Thyroid  Function Tests: No results for input(s): TSH, T4TOTAL, FREET4, T3FREE, THYROIDAB in the last 72 hours. Lipid Profile: No results for input(s): CHOL, HDL, LDLCALC, TRIG, CHOLHDL, LDLDIRECT in the last 72 hours. Anemia Panel: No results for input(s): VITAMINB12, FOLATE, FERRITIN, TIBC, IRON, RETICCTPCT in the last 72 hours. Urine analysis:    Component Value Date/Time   COLORURINE YELLOW 12/01/2024 1047   APPEARANCEUR HAZY (A) 12/01/2024 1047   LABSPEC 1.014 12/01/2024 1047   PHURINE 5.0 12/01/2024 1047   GLUCOSEU NEGATIVE 12/01/2024 1047   GLUCOSEU NEGATIVE 08/14/2024 1519   HGBUR SMALL (A) 12/01/2024 1047   BILIRUBINUR NEGATIVE 12/01/2024 1047   BILIRUBINUR negative 12/06/2023 1117   BILIRUBINUR Negative 02/04/2023 0917   KETONESUR NEGATIVE 12/01/2024 1047   PROTEINUR NEGATIVE 12/01/2024 1047   UROBILINOGEN 0.2 08/14/2024 1519   NITRITE NEGATIVE 12/01/2024 1047   LEUKOCYTESUR NEGATIVE 12/01/2024 1047   Sepsis Labs: Invalid input(s): PROCALCITONIN, LACTICIDVEN  Microbiology: Recent Results (from the past 240 hours)  MRSA Next Gen by PCR, Nasal     Status: None   Collection Time: 11/30/24  2:31 AM   Specimen: Nasal Mucosa; Nasal Swab  Result Value Ref Range Status   MRSA by PCR Next Gen NOT DETECTED NOT DETECTED Final    Comment: (NOTE) The GeneXpert MRSA Assay (FDA approved for NASAL specimens only), is one component of a comprehensive MRSA colonization surveillance program. It is not intended to diagnose MRSA infection nor to guide or monitor treatment for MRSA infections. Test performance is not FDA approved in patients less than 76 years old. Performed at [facility], [address] [place], [place] [address]   Culture, blood (Routine X 2) w Reflex to ID Panel     Status: None   Collection Time: 11/30/24  9:38 AM   Specimen: BLOOD RIGHT HAND  Result Value Ref  Range Status   Specimen Description   Final    BLOOD RIGHT HAND Performed at [facility], [address], [place], [place] [address]    Special Requests   Final    BOTTLES DRAWN AEROBIC ONLY Blood Culture results may not be optimal due to an inadequate volume of blood received in culture bottles Performed at [facility], [address], [place], [place] [address]  Culture   Final    NO GROWTH 5 DAYS Performed at [facility], [address], [place], [place] [address]    Report Status 12/05/2024 FINAL  Final  Culture, blood (Routine X 2) w Reflex to ID Panel     Status: None   Collection Time: 11/30/24  9:42 AM   Specimen: BLOOD LEFT HAND  Result Value Ref Range Status   Specimen Description   Final    BLOOD LEFT HAND Performed at [facility], [address] [place], [place] [address]    Special Requests   Final    BOTTLES DRAWN AEROBIC ONLY Blood Culture results may not be optimal due to an inadequate volume of blood received in culture bottles Performed at [facility], [address] [place], [place] [address]    Culture   Final    NO GROWTH 5 DAYS Performed at [facility], [address] [place], [place] [address]    Report Status 12/05/2024 FINAL  Final    Radiology Studies: No results found.    Shamal Stracener T. Jude Naclerio [facility]  If 7PM-7AM, please contact night-coverage [email or web address] 12/06/2024, 12:09 PM   "

## 2024-12-06 NOTE — Progress Notes (Signed)
 Physical Therapy Treatment Patient Details Name: [name] MRN: [id number] DOB: July 07, 1951 Today's Date: 12/06/2024   History of Present Illness [name] is a 74 yo female admitted with altered mental status and admitted for acute hepatic encephalopathy, RR for hypothermia  PMH: Pt with two recent hospitalizations for AMS, and acute CVA May 2025,  arthritis, colon and cecum ca, hepatic encephalopathy HTN, anemia, liver dz, falls, MASH cirrhosis, variceal hemorrhage s/p TIPS, SIRS, A-fib, hypothyroidism, CVA and AKI.    PT Comments  Spoke with pt and husband at length on patient mobility need to DC home safely. She will have about 100 feet from car to house to traverse and a ramp. Today she was very fatigued  with little mobility requiring great rest break before trying to perform even a sit to stand. Pt and husband have concerns of safety if DC home today , would like to see her be able to mobilize with a little less assistance. Agree this may be best of able. Discussed at length safety measures they can put in place if they can get a WC to assist from car to door, and have chair in house for long distances until she regains her strength back. Will have MS team try to see her this afternoon again, and tomorrow morning as well. Pt and husband still want to have pt f/u with OPPT at [facility] [address] due to pt has been there and familiar with her therapists. Husband calling OP clinic today. Will continue to follow as she is here in the hospital    If plan is discharge home, recommend the following: A lot of help with walking and/or transfers;A little help with bathing/dressing/bathroom;Assistance with cooking/housework;Assist for transportation;Help with stairs or ramp for entrance   Can travel by private vehicle        Equipment Recommendations  None recommended by PT    Recommendations for Other Services       Precautions / Restrictions Precautions Precautions:  Fall Recall of Precautions/Restrictions: Intact Precaution/Restrictions Comments: flexiseal Restrictions Weight Bearing Restrictions Per Provider Order: No     Mobility  Bed Mobility Overal bed mobility:  (not tested, pt up in recliner at this time)                  Transfers Overall transfer level: Needs assistance Equipment used: Rolling walker (2 wheels) Transfers: Sit to/from Stand Sit to Stand: Min assist           General transfer comment: cues for hand placement for push up from armrest to rise , min to contact to boost to standing    Ambulation/Gait Ambulation/Gait assistance: Contact guard assist, Min assist Gait Distance (Feet): 40 Feet Assistive device: Rolling walker (2 wheels) Gait Pattern/deviations: Step-to pattern, Decreased step length - right, Decreased step length - left, Trunk flexed Gait velocity: decr     General Gait Details: slow, very challenging fatigue wise for this distance, fatigued midway , but able to push through to head back to recliner   Stairs             Wheelchair Mobility     Tilt Bed    Modified Rankin (Stroke Patients Only)       Balance Overall balance assessment: Needs assistance Sitting-balance support: Feet supported Sitting balance-Leahy Scale: Fair     Standing balance support: Bilateral upper extremity supported, During functional activity, Reliant on assistive device for balance Standing balance-Leahy Scale: Poor Standing balance comment: reliant on  RW                            Communication Communication Communication: No apparent difficulties  Cognition Arousal: Alert Behavior During Therapy: WFL for tasks assessed/performed   PT - Cognitive impairments: Difficult to assess                       PT - Cognition Comments: much improved MS, follows directions Following commands: Intact Following commands impaired: Only follows one step commands consistently     Cueing Cueing Techniques: Verbal cues, Gestural cues, Tactile cues, Visual cues  Exercises Other Exercises Other Exercises: after 5 minute rest after ambulation. Performed a sit to stand with mini marching 10 times , before fatiguing again and need to sit.    General Comments        Pertinent Vitals/Pain Pain Assessment Faces Pain Scale: Hurts a little bit Pain Location: Cramping in R leg Pain Descriptors / Indicators: Grimacing, Discomfort, Guarding    Home Living                          Prior Function            PT Goals (current goals can now be found in the care plan section) Acute Rehab PT Goals Patient Stated Goal: Regain IND PT Goal Formulation: With patient Time For Goal Achievement: 12/14/24 Potential to Achieve Goals: Good Progress towards PT goals: Progressing toward goals    Frequency    Min 3X/week      PT Plan      Co-evaluation              AM-PAC PT 6 Clicks Mobility   Outcome Measure  Help needed turning from your back to your side while in a flat bed without using bedrails?: A Little Help needed moving from lying on your back to sitting on the side of a flat bed without using bedrails?: A Little Help needed moving to and from a bed to a chair (including a wheelchair)?: A Little Help needed standing up from a chair using your arms (e.g., wheelchair or bedside chair)?: A Little Help needed to walk in hospital room?: A Lot Help needed climbing 3-5 steps with a railing? : Total 6 Click Score: 15    End of Session Equipment Utilized During Treatment: Gait belt Activity Tolerance: Patient tolerated treatment well Patient left: in chair;with call bell/phone within reach;with chair alarm set;with family/visitor present Nurse Communication: Mobility status PT Visit Diagnosis: Difficulty in walking, not elsewhere classified (R26.2);Muscle weakness (generalized) (M62.81)     Time: 8669-8584 PT Time Calculation (min) (ACUTE  ONLY): 45 min  Charges:    $Gait Training: 8-22 mins PT General Charges $$ ACUTE PT VISIT: 1 Visit                     Janaysha Depaulo, PT, MPT Acute Rehabilitation Services Office: [phone] If a weekend: secure chat groups: [facility] PT, [facility] OT, [facility] SLP 12/06/2024    Horatio Bertz 12/06/2024, 2:48 PM

## 2024-12-06 NOTE — Plan of Care (Signed)
  Problem: Education: Goal: Knowledge of General Education information will improve Description: Including pain rating scale, medication(s)/side effects and non-pharmacologic comfort measures Outcome: Progressing   Problem: Activity: Goal: Risk for activity intolerance will decrease Outcome: Progressing   Problem: Pain Managment: Goal: General experience of comfort will improve and/or be controlled Outcome: Progressing

## 2024-12-06 NOTE — Progress Notes (Signed)
 Occupational Therapy Treatment Patient Details Name: [name] MRN: [id number] DOB: 28-May-1951 Today's Date: 12/06/2024   History of present illness [name] is a 74 yo female admitted with altered mental status and admitted for acute hepatic encephalopathy, RR for hypothermia  PMH: Pt with two recent hospitalizations for AMS, and acute CVA May 2025,  arthritis, colon and cecum ca, hepatic encephalopathy HTN, anemia, liver dz, falls, MASH cirrhosis, variceal hemorrhage s/p TIPS, SIRS, A-fib, hypothyroidism, CVA and AKI.   OT comments  Patient seen in order to increase activity tolerance and functional mobility. Patient with improved cognition, and eager to mobilize. Patient requiring mod A for bed mobility, with need for incremental breaks due to fatigue. Patient min A for safety and line management to complete stand pivot to recliner, with no physical assist needed. Educated on lower body exercises to promote increased strength. OT recommendation remains approrpiate, will continue to follow acutely.   VSS on RA      If plan is discharge home, recommend the following:  A little help with walking and/or transfers;A little help with bathing/dressing/bathroom;Assistance with cooking/housework;Direct supervision/assist for medications management;Direct supervision/assist for financial management;Assist for transportation;Help with stairs or ramp for entrance;Supervision due to cognitive status   Equipment Recommendations  None recommended by OT    Recommendations for Other Services      Precautions / Restrictions Precautions Precautions: Fall Recall of Precautions/Restrictions: Intact Precaution/Restrictions Comments: flexiseal Restrictions Weight Bearing Restrictions Per Provider Order: No       Mobility Bed Mobility Overal bed mobility: Needs Assistance Bed Mobility: Supine to Sit     Supine to sit: Mod assist     General bed mobility comments: increased dependence  to pull against OT to come into sitting, requiring incremental rest break due to effort and fatigue    Transfers Overall transfer level: Needs assistance Equipment used: Rolling walker (2 wheels) Transfers: Sit to/from Stand Sit to Stand: Min assist           General transfer comment: cues for hand placement, no physical assist needed, good static standing balance     Balance Overall balance assessment: Needs assistance Sitting-balance support: Feet supported Sitting balance-Leahy Scale: Fair     Standing balance support: Bilateral upper extremity supported, During functional activity, Reliant on assistive device for balance Standing balance-Leahy Scale: Poor Standing balance comment: reliant on RW                           ADL either performed or assessed with clinical judgement   ADL Overall ADL's : Needs assistance/impaired Eating/Feeding: Set up;Sitting   Grooming: Set up;Sitting           Upper Body Dressing : Set up;Sitting Upper Body Dressing Details (indicate cue type and reason): donning back gown     Toilet Transfer: Minimal assistance;Stand-pivot;BSC/3in1;Rolling walker (2 wheels) Toilet Transfer Details (indicate cue type and reason): no assist needed to come into standing         Functional mobility during ADLs: Minimal assistance;Rolling walker (2 wheels) General ADL Comments: Patient seen in order to increase activity tolerance and functional mobility. Patient with improved cognition, and eager to mobilize. Patient requiring mod A for bed mobility, with need for incremental breaks due to fatigue. Patient min A for safety and line management to complete stand pivot to recliner, with no physical assist needed. Educated on lower body exercises to promote increased strength. OT recommendation remains approrpiate, will continue to follow  acutely.    Extremity/Trunk Assessment              Vision       Psychologist, [employer] Communication: No apparent difficulties   Cognition Arousal: Alert Behavior During Therapy: WFL for tasks assessed/performed Cognition: History of cognitive impairments             OT - Cognition Comments: improved cognition this date, no impulsivity                 Following commands: Intact Following commands impaired: Follows multi-step commands with increased time      Cueing   Cueing Techniques: Verbal cues, Gestural cues, Tactile cues, Visual cues  Exercises      Shoulder Instructions       General Comments VSS on RA    Pertinent Vitals/ Pain       Pain Assessment Pain Assessment: Faces Faces Pain Scale: Hurts little more Pain Location: Cramping in R leg Pain Descriptors / Indicators: Grimacing, Discomfort, Guarding Pain Intervention(s): Limited activity within patient's tolerance, Monitored during session, Repositioned  Home Living                                          Prior Functioning/Environment              Frequency  Min 2X/week        Progress Toward Goals  OT Goals(current goals can now be found in the care plan section)  Progress towards OT goals: Progressing toward goals  Acute Rehab OT Goals Patient Stated Goal: to get back home OT Goal Formulation: With patient/family Time For Goal Achievement: 12/14/24 Potential to Achieve Goals: Good  Plan      Co-evaluation                 AM-PAC OT 6 Clicks Daily Activity     Outcome Measure   Help from another person eating meals?: A Little Help from another person taking care of personal grooming?: A Little Help from another person toileting, which includes using toliet, bedpan, or urinal?: A Lot Help from another person bathing (including washing, rinsing, drying)?: A Lot Help from another person to put on and taking off regular upper body clothing?: A Little Help from another person to put on and taking off regular  lower body clothing?: A Lot 6 Click Score: 15    End of Session Equipment Utilized During Treatment: Gait belt;Rolling walker (2 wheels)  OT Visit Diagnosis: Unsteadiness on feet (R26.81);Muscle weakness (generalized) (M62.81);Cognitive communication deficit (R41.841)   Activity Tolerance Patient tolerated treatment well   Patient Left in chair;with call bell/phone within reach;with family/visitor present   Nurse Communication Mobility status        Time: 9166-9148 OT Time Calculation (min): 18 min  Charges: OT General Charges $OT Visit: 1 Visit OT Treatments $Self Care/Home Management : 8-22 mins  Ronal Gift E. Cameran Pettey, OTR/L Acute Rehabilitation Services [phone]   Melicia Esqueda 12/06/2024, 9:01 AM

## 2024-12-07 DIAGNOSIS — K7682 Hepatic encephalopathy: Secondary | ICD-10-CM | POA: Diagnosis not present

## 2024-12-07 LAB — CBC
HCT: 26.9 % — ABNORMAL LOW (ref 36.0–46.0)
Hemoglobin: 9 g/dL — ABNORMAL LOW (ref 12.0–15.0)
MCH: 35.4 pg — ABNORMAL HIGH (ref 26.0–34.0)
MCHC: 33.5 g/dL (ref 30.0–36.0)
MCV: 105.9 fL — ABNORMAL HIGH (ref 80.0–100.0)
Platelets: 135 K/uL — ABNORMAL LOW (ref 150–400)
RBC: 2.54 MIL/uL — ABNORMAL LOW (ref 3.87–5.11)
RDW: 20.9 % — ABNORMAL HIGH (ref 11.5–15.5)
WBC: 3.6 K/uL — ABNORMAL LOW (ref 4.0–10.5)
nRBC: 0 % (ref 0.0–0.2)

## 2024-12-07 LAB — COMPREHENSIVE METABOLIC PANEL WITH GFR
ALT: 44 U/L (ref 0–44)
AST: 78 U/L — ABNORMAL HIGH (ref 15–41)
Albumin: 2.4 g/dL — ABNORMAL LOW (ref 3.5–5.0)
Alkaline Phosphatase: 203 U/L — ABNORMAL HIGH (ref 38–126)
Anion gap: 5 (ref 5–15)
BUN: 8 mg/dL (ref 8–23)
CO2: 20 mmol/L — ABNORMAL LOW (ref 22–32)
Calcium: 8.8 mg/dL — ABNORMAL LOW (ref 8.9–10.3)
Chloride: 115 mmol/L — ABNORMAL HIGH (ref 98–111)
Creatinine, Ser: 1.08 mg/dL — ABNORMAL HIGH (ref 0.44–1.00)
GFR, Estimated: 54 mL/min — ABNORMAL LOW
Glucose, Bld: 78 mg/dL (ref 70–99)
Potassium: 3.9 mmol/L (ref 3.5–5.1)
Sodium: 140 mmol/L (ref 135–145)
Total Bilirubin: 0.8 mg/dL (ref 0.0–1.2)
Total Protein: 4.7 g/dL — ABNORMAL LOW (ref 6.5–8.1)

## 2024-12-07 LAB — AMMONIA: Ammonia: 82 umol/L — ABNORMAL HIGH (ref 9–35)

## 2024-12-07 LAB — MAGNESIUM: Magnesium: 1.4 mg/dL — ABNORMAL LOW (ref 1.7–2.4)

## 2024-12-07 MED ORDER — MAGNESIUM SULFATE 4 GM/100ML IV SOLN
4.0000 g | Freq: Once | INTRAVENOUS | Status: AC
  Administered 2024-12-07: 4 g via INTRAVENOUS
  Filled 2024-12-07: qty 100

## 2024-12-07 MED ORDER — MIDODRINE HCL 10 MG PO TABS: 10.0000 mg | ORAL_TABLET | Freq: Three times a day (TID) | ORAL | 0 refills | Status: AC

## 2024-12-07 NOTE — Discharge Summary (Signed)
 "  Physician Discharge Summary  [name] FMW:[id number] DOB: December 16, 1950 DOA: 11/28/2024  PCP: [name], NP  Admit date: 11/28/2024 Discharge date: 12/07/24  Admitted From: Home Disposition: Home Recommendations for Outpatient Follow-up:  Outpatient follow-up with PCP as below Check blood pressure, CMP and CBC at follow-up Please follow up on the following pending results: None  Home Health: Patient/family declined home health.  Ambulatory referral ordered. Equipment/Devices: Patient has appropriate DME  Discharge Condition: Stable CODE STATUS: Full code   Follow-up Information     [name], NP. Schedule an appointment as soon as possible for a visit in 1 week(s).   Specialties: Nurse Practitioner, Family Medicine Contact information: [address] [place] [address] [phone]                 Hospital course 74 y.o. female with PMH of  cirrhosis 2/2 chemotherapy s/p TIPS c/b hepatic encephalopathy, colon cancer s/p hemicolectomy, CVA, IDA, hypothyroidism, HTN and gastritis presented to ED with altered mental status, tremor and decreased bowel movement, and admitted with acute hepatic encephalopathy with ammonia to 222.  As per spouse, patient is prescribed 3 teaspoons of lactulose  4 times a day however he has been doing 3 times a day with good 2-3 bowel movements a day. Synthroid  dose was reduced from 75 to 50 mcg 3 weeks ago.  Over the last week, he has noticed patient has had decreased bowel movement on her current lactulose  dose. Over the last few days, she has had mild tremor and has become more sleepy but has been able to have conversations as usual  but on 1/6 am patient w/ significant amount of stool and altered more than her baseline so he presented her to the ED for further evaluation.    In ED, stable vitals.  K5.3. Cr 1.53.  BUN 32.  AST 79.  ALT 49.  ALP 228.  Ammonia 222.  Hgb 10.8.  Platelet 120.  Glucose 84.  CXR with cardiomegaly.  CT  head without acute finding.  Received IV fluid and started on lactulose  enema.  Blood culture and MRI brain ordered.  GI consulted.   MRI brain without acute finding.  Blood cultures NGTD.  Evaluated by GI and continue the lactulose  and rifaximin .  Patient's encephalopathy resolved.  However, she remained hypotensive and started on midodrine .  On the day of discharge, encephalopathy resolved.  She is awake and alert and oriented x 4.  She is discharged on midodrine  for hypotension.  Advised to hold diuretics for 3 to 4 days until blood pressure improves.  Therapy recommended home health but husband prefers outpatient follow-up.  See individual problem list below for more.   Problems addressed during this hospitalization Acute hepatic encephalopathy Chemo induced liver cirrhosis s/p TIPS Esophageal varices/portal hypertension History of esophageal varices and portal hypertension: -ammonia 222>>> 82.  Encephalopathy resolved.  She is awake and alert and oriented x 4 today -Continue home lactulose  for goal of 2-3 bowel movements a day.  Husband is quite familiar with this -Continue home rifaximin  - Advised to hold diuretics given soft blood pressure.   Generalized weakness/physical deconditioning: Husband said that she has not mobilized since admission.  Husband chose outpatient therapy when therapy evaluated patient.  They have appropriate DME's.   Hypotension: Likely due to liver cirrhosis.  Echocardiogram from 03/2024 without significant finding.  Normal cortisol and TSH during this hospitalization. -Continue midodrine  10 mg 3 times daily until blood pressure improves. -Continue holding  diuretics until blood pressure improves.   Hypothermia: Transferred to stepdown 1/7 overnight for [employer] No obvious signs of infection. BUT W/ anemia and hypoglycemia. Recurrent issues w/ hypothermia on chart review back in July and August.  Hypothermia resolved.   Pancytopenia: Likely due to liver  cirrhosis.  Received 1 unit on 1/13 with appropriate response. - Recheck CBC at follow-up.   Goal of care: Evaluated by palliative medicine.  Remains full code with full scope of care   Mild metabolic acidosis: Likely due to liver cirrhosis.  Improved.   Hypothyroidism: TSH 1.33 -Continue home Synthroid .  Per husband, recently reduced to 50 mcg daily - Consider rechecking TSH in 4 to 6 weeks   Hypomagnesemia: Replenished prior to discharge.  Urine retention: Resolved.   Hypoglycemia: Resolved.    AKI: Resolved.   Hyperkalemia-resolved  Hyperchloremia:   History of CVA   GERD: Continue PPI   Body mass index is 24.33 kg/m.           Consultations: Gastroenterology Palliative care  Time spent 35  minutes  Vital signs Vitals:   12/06/24 1412 12/06/24 1442 12/06/24 2055 12/07/24 0454  BP: (!) 108/55  (!) 113/50 (!) 100/54  Pulse: (!) 47  (!) 51 (!) 56  Temp: 98.5 F (36.9 C)   (!) 97.4 F (36.3 C)  Resp: 16  18 18   Height:      Weight:      SpO2: 97% 98% 97% 96%  TempSrc:    Oral  BMI (Calculated):         Discharge exam  GENERAL: No apparent distress.  Nontoxic. HEENT: MMM.  Vision and hearing grossly intact.  NECK: Supple.  No apparent JVD.  RESP:  No IWOB.  Fair aeration bilaterally. CVS:  RRR. Heart sounds normal.  ABD/GI/GU: BS+. Abd soft, NTND.  MSK/EXT:  Moves extremities. No apparent deformity.  1+ BLE edema. SKIN: no apparent skin lesion or wound NEURO: AA.  Oriented x 4..  No apparent focal neuro deficit. PSYCH: Calm. Normal affect.  Discharge Instructions Discharge Instructions     Ambulatory referral to Physical Therapy   Complete by: As directed    Discharge instructions   Complete by: As directed    It has been a pleasure taking care of you!  You were hospitalized due to altered mental status likely due to high ammonia for which you have been treated.  Your symptoms and ammonia level improved.  Continue taking your lactulose   for a goal of 2-3 bowel months a day.  We have started you on medication for low blood pressure.  We recommend holding your fluid medication for the next 3 to 4 days until your blood pressure improves.  Follow-up with your primary care doctor and liver doctor in 1 to 2 weeks or sooner if needed.   Take care,   Increase activity slowly   Complete by: As directed    Increase activity slowly   Complete by: As directed    No wound care   Complete by: As directed    No wound care   Complete by: As directed       Allergies as of 12/07/2024       Reactions   Contrast Media [iodinated Contrast Media] Hives        Medication List     PAUSE taking these medications    furosemide  20 MG tablet Wait to take this until: December 09, 2024 Commonly known as: LASIX  Take 20-40 mg by mouth  daily. Depending on blood pressure   spironolactone  25 MG tablet Wait to take this until: December 09, 2024 Commonly known as: ALDACTONE  Take 50 mg by mouth daily.       STOP taking these medications    gabapentin  100 MG capsule Commonly known as: NEURONTIN    potassium chloride  SA 20 MEQ tablet Commonly known as: KLOR-CON  M       TAKE these medications    aspirin  EC 81 MG tablet Take 1 tablet (81 mg total) by mouth daily. Swallow whole.   CRANBERRY PO Take 1 tablet by mouth 2 (two) times daily.   Enulose  10 GM/15ML Soln Generic drug: lactulose  (encephalopathy) Take 45 mLs by mouth See admin instructions. 45 ml by mouth three to four times a day depending on daily plans   ferrous sulfate  325 (65 FE) MG tablet Take 1 tablet (325 mg total) by mouth 2 (two) times daily with a meal.   hydrocortisone  2.5 % rectal cream Commonly known as: Anusol -HC Place 1 Application rectally 2 (two) times daily. What changed:  when to take this reasons to take this   levOCARNitine  330 MG tablet Commonly known as: CARNITOR  Take 330 mg by mouth 3 (three) times daily.   liver oil-zinc  oxide 40 %  ointment Commonly known as: DESITIN Apply topically daily as needed for irritation.   magnesium  oxide 400 MG tablet Commonly known as: MAG-OX Take 1 tablet (400 mg total) by mouth daily.   midodrine  10 MG tablet Commonly known as: PROAMATINE  Take 1 tablet (10 mg total) by mouth 3 (three) times daily with meals.   Multi-Vitamin tablet Take 1 tablet by mouth in the morning.   pantoprazole  40 MG tablet Commonly known as: PROTONIX  Take 1 tablet (40 mg total) by mouth daily.   polyethylene glycol 17 g packet Commonly known as: MIRALAX  / GLYCOLAX  Take 17 g by mouth daily. What changed:  when to take this reasons to take this   Probiotic Pearls Womens Caps Take 1 capsule by mouth in the morning.   rifaximin  550 MG Tabs tablet Commonly known as: XIFAXAN  Take 1 tablet (550 mg total) by mouth 2 (two) times daily.   Synthroid  50 MCG tablet Generic drug: levothyroxine  Take 1 tablet (50 mcg total) by mouth daily before breakfast.   valACYclovir  1000 MG tablet Commonly known as: VALTREX  Take 1 tablet (1,000 mg total) by mouth 3 (three) times daily.         Procedures/Studies:   MR BRAIN WO CONTRAST Result Date: 12/02/2024 EXAM: MRI BRAIN WITHOUT CONTRAST 12/02/2024 05:58:20 PM TECHNIQUE: Multiplanar multisequence MRI of the head/brain was performed without the administration of intravenous contrast. COMPARISON: Head CT 11/28/2024 and MRI 05/24/2024. CLINICAL HISTORY: Mental status change, unknown cause. FINDINGS: BRAIN AND VENTRICLES: There is no evidence of an acute infarct, mass, midline shift, hydrocephalus, or extra-axial fluid collection. Intrinsic T1 hyperintensity is again noted in the globi palladi consistent with known chronic liver disease. Cerebral volume is within normal limits for age. There are small chronic infarcts in the right thalamus, left occipital lobe, and left cerebellar hemisphere with associated chronic blood products in the left occipital lobe. Patchy T2  hyperintensities in the cerebral white matter bilaterally have mildly progressed from the prior MRI and are nonspecific but compatible with moderate chronic small vessel ischemic disease. Major intracranial vascular flow voids are preserved. ORBITS: Bilateral cataract extraction. SINUSES AND MASTOIDS: Minimal mucosal thickening in the paranasal sinuses. Clear mastoid air cells. BONES AND SOFT TISSUES: Normal marrow signal. No  significant soft tissue abnormality. IMPRESSION: 1. No acute intracranial abnormality. 2. Moderate chronic small vessel ischemic disease with chronic posterior circulation infarcts as above. Electronically signed by: Dasie Hamburg MD MD 12/02/2024 06:07 PM EST RP Workstation: [id number]   DG Chest Port 1 View Result Date: 12/02/2024 EXAM: 1 VIEW XRAY OF THE CHEST 12/02/2024 08:52:00 AM COMPARISON: 11/28/2024 CLINICAL HISTORY: 74 year old female. Shortness of breath. FINDINGS: LINES, TUBES AND DEVICES: TIPS stent in right upper quadrant. LUNGS AND PLEURA: No pneumothorax. Mildly improved lung volumes. No confluent opacity. HEART AND MEDIASTINUM: Stable cardiomegaly. No acute abnormality of the mediastinal silhouette. BONES AND SOFT TISSUES: No acute osseous abnormality. IMPRESSION: 1. Stable cardiomegaly.  No acute cardiopulmonary abnormality. Electronically signed by: Helayne Hurst MD MD 12/02/2024 08:56 AM EST RP Workstation: [id number]   DG Chest Portable 1 View Result Date: 11/28/2024 CLINICAL DATA:  Altered mental status EXAM: PORTABLE CHEST 1 VIEW COMPARISON:  07/01/2024 FINDINGS: Hypoventilatory changes. Cardiomegaly. No focal opacity, pleural effusion or pneumothorax. Minimal atelectasis or scar in the left lower lung. Aortic atherosclerosis. Tips in the right upper quadrant. IMPRESSION: No active disease. Cardiomegaly. Electronically Signed   By: Luke Bun M.D.   On: 11/28/2024 15:44   CT Head Wo Contrast Result Date: 11/28/2024 CLINICAL DATA:  Mental status change encephalopathy  EXAM: CT HEAD WITHOUT CONTRAST TECHNIQUE: Contiguous axial images were obtained from the base of the skull through the vertex without intravenous contrast. RADIATION DOSE REDUCTION: This exam was performed according to the departmental dose-optimization program which includes automated exposure control, adjustment of the mA and/or kV according to patient size and/or use of iterative reconstruction technique. COMPARISON:  CT 06/19/2024, MRI 05/24/2024 FINDINGS: Brain: No acute territorial infarction, hemorrhage or intracranial mass. Small chronic left occipital and cerebellar infarcts. Chronic lacunar infarct right thalamus. Scattered white matter hypodensity consistent with chronic small vessel disease. No ventricular enlargement. Vascular: No hyperdense vessels.  Carotid vascular calcification Skull: No fracture Sinuses/Orbits: No acute finding. Other: None IMPRESSION: 1. No CT evidence for acute intracranial abnormality. 2. Chronic small vessel disease of the white matter. Chronic left PCA infarct Electronically Signed   By: Luke Bun M.D.   On: 11/28/2024 15:43   MM 3D SCREENING MAMMOGRAM BILATERAL BREAST Result Date: 11/13/2024 CLINICAL DATA:  Screening. EXAM: DIGITAL SCREENING BILATERAL MAMMOGRAM WITH TOMOSYNTHESIS AND CAD TECHNIQUE: Bilateral screening digital craniocaudal and mediolateral oblique mammograms were obtained. Bilateral screening digital breast tomosynthesis was performed. The images were evaluated with computer-aided detection. Best images possible per technologist communication. COMPARISON:  Previous exam(s). ACR Breast Density Category b: There are scattered areas of fibroglandular density. FINDINGS: There are no findings suspicious for malignancy. IMPRESSION: No mammographic evidence of malignancy. A result letter of this screening mammogram will be mailed directly to the patient. RECOMMENDATION: Screening mammogram in one year. (Code:SM-B-01Y) BI-RADS CATEGORY  1: Negative.  Electronically Signed   By: Corean Salter M.D.   On: 11/13/2024 12:12       The results of significant diagnostics from this hospitalization (including imaging, microbiology, ancillary and laboratory) are listed below for reference.     Microbiology: Recent Results (from the past 240 hours)  MRSA Next Gen by PCR, Nasal     Status: None   Collection Time: 11/30/24  2:31 AM   Specimen: Nasal Mucosa; Nasal Swab  Result Value Ref Range Status   MRSA by PCR Next Gen NOT DETECTED NOT DETECTED Final    Comment: (NOTE) The GeneXpert MRSA Assay (FDA approved for NASAL specimens only), is one component of  a comprehensive MRSA colonization surveillance program. It is not intended to diagnose MRSA infection nor to guide or monitor treatment for MRSA infections. Test performance is not FDA approved in patients less than 37 years old. Performed at [facility], [address], [place], [place] [address]   Culture, blood (Routine X 2) w Reflex to ID Panel     Status: None   Collection Time: 11/30/24  9:38 AM   Specimen: BLOOD RIGHT HAND  Result Value Ref Range Status   Specimen Description   Final    BLOOD RIGHT HAND Performed at [facility], [address] [place], [place] [address]    Special Requests   Final    BOTTLES DRAWN AEROBIC ONLY Blood Culture results may not be optimal due to an inadequate volume of blood received in culture bottles Performed at [facility], [address] [place], [place] [address]    Culture   Final    NO GROWTH 5 DAYS Performed at [facility], [address], [place], [place] [address]    Report Status 12/05/2024 FINAL  Final  Culture, blood (Routine X 2) w Reflex to ID Panel     Status: None   Collection Time: 11/30/24  9:42 AM   Specimen: BLOOD LEFT HAND  Result Value Ref Range Status   Specimen Description   Final    BLOOD LEFT HAND Performed at [facility], [address]  [place], [place] [address]    Special Requests   Final    BOTTLES DRAWN AEROBIC ONLY Blood Culture results may not be optimal due to an inadequate volume of blood received in culture bottles Performed at [facility], [address] [place], [place] [address]    Culture   Final    NO GROWTH 5 DAYS Performed at [facility], [address] [place], [place] [address]    Report Status 12/05/2024 FINAL  Final     Labs:  CBC: Recent Labs  Lab 12/01/24 0324 12/02/24 0306 12/03/24 0302 12/04/24 0255 12/05/24 0308 12/05/24 1535 12/06/24 0731 12/07/24 0738  WBC 3.1* 2.4* 2.2* 2.4* 3.0*  --  3.5* 3.6*  NEUTROABS 1.7 1.3*  --   --   --   --   --   --   HGB 8.5* 8.1* 7.8* 7.2* 7.2* 9.2* 9.2* 9.0*  HCT 26.2* 24.7* 24.6* 21.9* 22.0* 26.6* 27.2* 26.9*  MCV 107.8* 108.3* 110.3* 109.5* 109.5*  --  105.0* 105.9*  PLT 113* 99* 107* 101* 105*  --  112* 135*   BMP &GFR Recent Labs  Lab 12/03/24 0302 12/04/24 0255 12/05/24 0308 12/06/24 0731 12/07/24 0738  NA 142 140 138 139 140  K 4.4 4.5 3.9 3.9 3.9  CL 118* 113* 113* 113* 115*  CO2 18* 19* 19* 17* 20*  GLUCOSE 67* 81 89 74 78  BUN 11 12 11 9 8   CREATININE 0.86 1.05* 1.25* 0.97 1.08*  CALCIUM  8.5* 8.8* 8.5* 8.6* 8.8*  MG  --   --   --  1.7 1.4*   Estimated Creatinine Clearance: 40.1 mL/min (A) (by C-G formula based on SCr of 1.08 mg/dL (H)). Liver & Pancreas: Recent Labs  Lab 12/04/24 0255 12/06/24 0731 12/07/24 0738  AST 53* 76* 78*  ALT 30 40 44  ALKPHOS 144* 191* 203*  BILITOT 0.7 0.7 0.8  PROT 4.6* 4.7* 4.7*  ALBUMIN  2.3* 2.4* 2.4*   No results for input(s):  LIPASE, AMYLASE in the last 168 hours. Recent Labs  Lab 12/01/24 0324 12/02/24 0306 12/06/24 0940 12/07/24 0738  AMMONIA 32 33 114* 82*   Diabetic: No results for input(s): HGBA1C in the last 72 hours. Recent Labs  Lab 12/01/24 2300  GLUCAP 123*   Cardiac Enzymes: No results for input(s): CKTOTAL, CKMB, CKMBINDEX,  TROPONINI in the last 168 hours. No results for input(s): PROBNP in the last 8760 hours. Coagulation Profile: Recent Labs  Lab 12/01/24 0324 12/02/24 1231 12/06/24 0940  INR 1.4* 1.3* 1.4*   Thyroid  Function Tests: No results for input(s): TSH, T4TOTAL, FREET4, T3FREE, THYROIDAB in the last 72 hours. Lipid Profile: No results for input(s): CHOL, HDL, LDLCALC, TRIG, CHOLHDL, LDLDIRECT in the last 72 hours. Anemia Panel: No results for input(s): VITAMINB12, FOLATE, FERRITIN, TIBC, IRON, RETICCTPCT in the last 72 hours. Urine analysis:    Component Value Date/Time   COLORURINE YELLOW 12/01/2024 1047   APPEARANCEUR HAZY (A) 12/01/2024 1047   LABSPEC 1.014 12/01/2024 1047   PHURINE 5.0 12/01/2024 1047   GLUCOSEU NEGATIVE 12/01/2024 1047   GLUCOSEU NEGATIVE 08/14/2024 1519   HGBUR SMALL (A) 12/01/2024 1047   BILIRUBINUR NEGATIVE 12/01/2024 1047   BILIRUBINUR negative 12/06/2023 1117   BILIRUBINUR Negative 02/04/2023 0917   KETONESUR NEGATIVE 12/01/2024 1047   PROTEINUR NEGATIVE 12/01/2024 1047   UROBILINOGEN 0.2 08/14/2024 1519   NITRITE NEGATIVE 12/01/2024 1047   LEUKOCYTESUR NEGATIVE 12/01/2024 1047   Sepsis Labs: Invalid input(s): PROCALCITONIN, LACTICIDVEN   SIGNED:  Lorma Heater T Mariam Helbert, MD  [facility] 12/07/2024, 3:30 PM   "

## 2024-12-07 NOTE — Plan of Care (Signed)

## 2024-12-07 NOTE — Care Management Important Message (Signed)
 Important Message  Patient Details IM Letter given. Name: [name] MRN: [id number] Date of Birth: 09/18/1951   Important Message Given:  Yes - Medicare IM     Melba Ates 12/07/2024, 11:33 AM

## 2024-12-07 NOTE — Plan of Care (Signed)
  Problem: Education: Goal: Knowledge of General Education information will improve Description: Including pain rating scale, medication(s)/side effects and non-pharmacologic comfort measures Outcome: Progressing   Problem: Activity: Goal: Risk for activity intolerance will decrease Outcome: Progressing   Problem: Elimination: Goal: Will not experience complications related to bowel motility Outcome: Progressing   Problem: Pain Managment: Goal: General experience of comfort will improve and/or be controlled Outcome: Progressing

## 2024-12-08 ENCOUNTER — Telehealth: Payer: Self-pay

## 2024-12-08 NOTE — Transitions of Care (Post Inpatient/ED Visit) (Signed)
 "  12/08/2024  Name: [name] MRN: [id number] DOB: 1951-04-13  Today's TOC FU Call Status: Today's TOC FU Call Status:: Successful TOC FU Call Completed TOC FU Call Complete Date: 12/08/24  Patient's Name and Date of Birth confirmed. Name, DOB (Assessment completed in the presence of her spouse/dpr Madeline Boers) who assisted with PHI)  Transition Care Management Follow-up Telephone Call Date of Discharge: 12/07/24 Discharge Facility: [facility]) Type of Discharge: Inpatient Admission Primary Inpatient Discharge Diagnosis:: Acute hepatic encephalopathy  Chemo induced liver cirrhosis s/p TIPS How have you been since you were released from the hospital?: Same Any questions or concerns?: No  Items Reviewed: Did you receive and understand the discharge instructions provided?: Yes Medications obtained,verified, and reconciled?: Yes (Medications Reviewed) Any new allergies since your discharge?: No Dietary orders reviewed?: Yes Type of Diet Ordered:: low sodium high protein  Medications Reviewed Today: Medications Reviewed Today     Reviewed by Ethelmae Ringel E, RN (Registered Nurse) on 12/08/24 at 0912  Med List Status: <None>   Medication Order Taking? Sig Documenting Provider Last Dose Status Informant  aspirin  EC 81 MG tablet [id number] Yes Take 1 tablet (81 mg total) by mouth daily. Swallow whole. Bryn Bernardino NOVAK, MD  Active Spouse/Significant Other, Pharmacy Records           Med Note MARISA, NATHANEL LOISE [place] Jun 07, 2024  5:47 PM)    CRANBERRY PO [id number] Yes Take 1 tablet by mouth 2 (two) times daily. [provider]  Active Spouse/Significant Other, Pharmacy Records           Med Note MARISA, NATHANEL LOISE   Wed Jun 07, 2024  5:50 PM)    ENULOSE  10 GM/15ML SOLN [id number] Yes Take 45 mLs by mouth See admin instructions. 45 ml by mouth three to four times a day depending on daily plans [provider]  Active Spouse/Significant Other, Pharmacy Records  ferrous  sulfate 325 (65 FE) MG tablet [id number] Yes Take 1 tablet (325 mg total) by mouth 2 (two) times daily with a meal. Shahmehdi, Adriana LABOR, MD  Active Spouse/Significant Other, Pharmacy Records  furosemide  (LASIX ) 20 MG tablet [id number]  Take 20-40 mg by mouth daily. Depending on blood pressure [provider]  Active Spouse/Significant Other, Pharmacy Records           Med Note MARISA, NATHANEL LOISE   Wed Jun 07, 2024  6:04 PM)    hydrocortisone  (ANUSOL -HC) 2.5 % rectal cream [id number] Yes Place 1 Application rectally 2 (two) times daily. [name], NP  Active Spouse/Significant Other, Pharmacy Records  levOCARNitine  (CARNITOR ) 330 MG tablet [id number] Yes Take 330 mg by mouth 3 (three) times daily. [provider]  Active Spouse/Significant Other, Pharmacy Records           Med Note MARISA, NATHANEL LOISE   Wed Jun 07, 2024  5:55 PM)    liver oil-zinc  oxide (DESITIN) 40 % ointment [id number] Yes Apply topically daily as needed for irritation. Pokhrel, Laxman, MD  Active Spouse/Significant Other, Pharmacy Records  magnesium  oxide (MAG-OX) 400 MG tablet [id number] Yes Take 1 tablet (400 mg total) by mouth daily. Rojelio Nest, DO  Active Spouse/Significant Other, Pharmacy Records           Med Note [facility], NATHANEL LOISE   Wed Jun 07, 2024  5:55 PM)    midodrine  (PROAMATINE ) 10 MG tablet [id number] Yes Take 1 tablet (10 mg total) by mouth 3 (three) times daily with meals.  Gonfa, Taye T, MD  Active   Multiple Vitamin (MULTI-VITAMIN) tablet [id number] Yes Take 1 tablet by mouth in the morning. [provider]  Active Spouse/Significant Other, Pharmacy Records           Med Note MARISA, NATHANEL SAILOR   Wed Jun 07, 2024  5:53 PM)    pantoprazole  (PROTONIX ) 40 MG tablet [id number] Yes Take 1 tablet (40 mg total) by mouth daily. Abran Norleen SAILOR, MD  Active Spouse/Significant Other, Pharmacy Records  polyethylene glycol (MIRALAX  / GLYCOLAX ) 17 g packet [id number] Yes Take 17 g by mouth daily.  Patient taking  differently: Take 17 g by mouth daily as needed for mild constipation, moderate constipation or severe constipation.   Rizwan, Saima, MD  Active Spouse/Significant Other, Pharmacy Records  Probiotic Product (PROBIOTIC PEARLS WOMENS) CAPS [id number] Yes Take 1 capsule by mouth in the morning. [provider]  Active Spouse/Significant Other, Pharmacy Records  rifaximin  (XIFAXAN ) 550 MG TABS tablet [id number] Yes Take 1 tablet (550 mg total) by mouth 2 (two) times daily. Bryn Bernardino NOVAK, MD  Active Spouse/Significant Other, Pharmacy Records           Med Note [facility], NATHANEL SAILOR   Wed Jun 07, 2024  5:53 PM)    spironolactone  (ALDACTONE ) 25 MG tablet [id number]  Take 50 mg by mouth daily. [provider]  Active Spouse/Significant Other, Pharmacy Records  SYNTHROID  50 MCG tablet [id number] Yes Take 1 tablet (50 mcg total) by mouth daily before breakfast. [name], NP  Active Spouse/Significant Other, Pharmacy Records  valACYclovir  (VALTREX ) 1000 MG tablet [id number]  Take 1 tablet (1,000 mg total) by mouth 3 (three) times daily.  Patient not taking: Reported on 12/08/2024   Juleen Norleen, PA-C  Active Spouse/Significant Other, Pharmacy Records  Med List Note Lorne, [place], CPhT 11/29/24 1434): Husband handles medications.             Home Care and Equipment/Supplies: Were Home Health Services Ordered?: No Any new equipment or medical supplies ordered?: No  Functional Questionnaire: Do you need assistance with bathing/showering or dressing?: Yes Do you need assistance with meal preparation?: Yes Do you need assistance with eating?: No Do you have difficulty maintaining continence: Yes Do you need assistance with getting out of bed/getting out of a chair/moving?: Yes Do you have difficulty managing or taking your medications?: Yes  Follow up appointments reviewed: PCP Follow-up appointment confirmed?: No (Offered to assist patient with scheduling hospital follow up visist  with primary provider-spouse declined stating he will schedule.) Specialist Hospital Follow-up appointment confirmed?: No Reason Specialist Follow-Up Not Confirmed: Patient has Specialist Provider Number and will Call for Appointment Do you need transportation to your follow-up appointment?: No Do you understand care options if your condition(s) worsen?: Yes-patient verbalized understanding  SDOH Interventions Today    Flowsheet Row Most Recent Value  SDOH Interventions   Food Insecurity Interventions Intervention Not Indicated  Housing Interventions Intervention Not Indicated  Transportation Interventions Intervention Not Indicated  Utilities Interventions Intervention Not Indicated   Discussed and offered 30 day TOC program.  Patient  declined. The patient has been provided with contact information for the care management team and has been advised to call with any health -related questions or concerns.  The patient verbalized understanding with current plan of care.  The patient is directed to their insurance card regarding availability of benefits coverage.    Arvin Seip RN, BSN, CCM [employer], Population Health Case Manager Phone: [phone]  "

## 2024-12-08 NOTE — Patient Instructions (Signed)
 Visit Information  Thank you for taking time to visit with me today. Please don't hesitate to contact me if I can be of assistance to you   PATIENT INSTRUCTIONS: -monitor of blood pressures/ temperature and pulse reporting any abnormality to provider.  -take medication as prescribed -schedule hospital follow-up with provider -contact out-patient rehab agency for schedule ongoing therapy.     Patient verbalizes understanding of instructions and care plan provided today and agrees to view in MyChart. Active MyChart status and patient understanding of how to access instructions and care plan via MyChart confirmed with patient.     The patient has been provided with contact information for the care management team and has been advised to call with any health related questions or concerns.   Please call the care guide team at [phone] if you need to cancel or reschedule your appointment.   Please call the Suicide and Crisis Lifeline: 988 call the [place]  National Suicide Prevention Lifeline: (201)315-8942 or TTY: (724) 689-7095 TTY 309-314-9039) to talk to a trained counselor call 1-800-273-TALK (toll free, 24 hour hotline) if you are experiencing a Mental Health or Behavioral Health Crisis or need someone to talk to.  Arvin Seip RN, BSN, CCM [employer], Population Health Case Manager Phone: [phone]

## 2024-12-11 ENCOUNTER — Encounter: Payer: Self-pay | Admitting: Family Medicine

## 2024-12-11 ENCOUNTER — Ambulatory Visit: Admitting: Family Medicine

## 2024-12-11 VITALS — BP 108/62 | HR 55 | Temp 96.5°F | Ht 64.0 in | Wt 141.0 lb

## 2024-12-11 DIAGNOSIS — I959 Hypotension, unspecified: Secondary | ICD-10-CM

## 2024-12-11 DIAGNOSIS — S8991XA Unspecified injury of right lower leg, initial encounter: Secondary | ICD-10-CM

## 2024-12-11 DIAGNOSIS — R600 Localized edema: Secondary | ICD-10-CM

## 2024-12-11 DIAGNOSIS — E43 Unspecified severe protein-calorie malnutrition: Secondary | ICD-10-CM

## 2024-12-11 DIAGNOSIS — D638 Anemia in other chronic diseases classified elsewhere: Secondary | ICD-10-CM

## 2024-12-11 DIAGNOSIS — R531 Weakness: Secondary | ICD-10-CM

## 2024-12-11 DIAGNOSIS — R4182 Altered mental status, unspecified: Secondary | ICD-10-CM

## 2024-12-11 DIAGNOSIS — K7460 Unspecified cirrhosis of liver: Secondary | ICD-10-CM

## 2024-12-11 DIAGNOSIS — K7682 Hepatic encephalopathy: Secondary | ICD-10-CM

## 2024-12-11 DIAGNOSIS — D696 Thrombocytopenia, unspecified: Secondary | ICD-10-CM

## 2024-12-11 DIAGNOSIS — Z8679 Personal history of other diseases of the circulatory system: Secondary | ICD-10-CM

## 2024-12-11 NOTE — Progress Notes (Unsigned)
 " Ph: [phone] Fax: [phone]   Patient ID: [name], female    DOB: January 30, 1951, 74 y.o.   MRN: [id number]  This visit was conducted in person.  BP 108/62 (BP Location: Right Arm, Patient Position: Sitting, Cuff Size: Normal)   Pulse (!) 55   Temp (!) 96.5 F (35.8 C) (Oral)   Ht 5' 4 (1.626 m)   Wt 141 lb (64 kg)   SpO2 96%   BMI 24.20 kg/m   BP Readings from Last 3 Encounters:  12/11/24 108/62  12/07/24 (!) 100/54  11/01/24 (!) 102/58    CC: hosp f/u visit  Subjective:   HPI: [name] is a 74 y.o. female presenting on 12/11/2024 for Hospitalization Follow-up (Hsp FU/11/28/24 Hepatic encephalopathy (HCC)/Pt states she is slowly getting better/No balance, pt states she hasn't been able to stand on her own at all, BP has been soft so they cut fluid pills but now lots of swelling//Pt thinks they damaged right ankle getting in truck when leaving hsp, thinks she hit in on truck step, turned blue 1/17//Mr. [name] in room with pt/)   Macario  has a past medical history of Acute hepatic encephalopathy (HCC) (03/30/2024), Acute urinary retention (05/04/2022), Allergy (1952), Arthritis, Blood transfusion without reported diagnosis, Cancer (HCC), Cataract, Colon cancer (HCC) (2003), Elevated liver function tests, Esophageal varices (HCC), Heart murmur, Hemorrhage of gastrointestinal tract (05/04/2011), Hepatic encephalopathy (HCC) (2025), Hypertension, Hypothyroidism, Iron deficiency anemia, Liver disease, Malignant neoplasm of cecum (HCC), Myocardial infarction [facility]) (Jan 2025), Portal hypertension (HCC), Skin cancer (2019), Splenomegaly, Stroke (HCC), and Ulcer (05/2024).  H/o cirrhosis due to colon cancer chemotherapy (s/p hemicolectomy) s/p TIPS 2011. Deterioration after COVID infection 07/2020.   Recent hospitalization for AMS admitted with acute hepatic encephalopathy with ammonia up to 222. She was on lactulose  3 teaspoons TID however with decreased bowel movement frequency  despite this. Currently on 15mL QID - with 1 large soft formed stool /day.   Hospital records reviewed. Med rec performed.  Blood cultures NG x2.  Head CT and MRI brain without acute finding.  Saw GI - rec continue lactulose , rifaximin .  Due to hypotension, started on midodrine  10mg  tid with meals.  Also received blood transfusion x1.   They have held diuretics (furosemide , spironolactone ) due to low BPs 9 lb weight loss in the past month.  Noted weight fluctuations over the past 6 months - from 141 lbs nadir (today) to peak 164 lbs (05/2024).  Husband notes pedal edema worsening off diuretic.   Husband mentions she's been hospitalized 9 times in the past 12 months.  Husband is very familiar with her care.  Husband notes worsening balance.  Using walker at home.  She injured R ankle on way out of the hospital - hit it on their truck's metal step. Has RLE pain since then.   No fevers/chills, abd pain, chest pain, blood in stool or dark stools, no further AMS since home.   Home health declined by pt/family in hospital.  Other follow up appointments scheduled: PCP 01/31/2025, liver clinic 04/26/2025.   Per chart, PA has been approved for Xifaxan  550mg  BID (through [facility]).  ______________________________________________________________________ Hospital admission: 11/28/2024 Hospital discharge: 12/07/2024 TCM f/u phone call:  performed on 12/08/2024  Recommendations for Outpatient Follow-up:  Outpatient follow-up with PCP as below Check blood pressure, CMP and CBC at follow-up Please follow up on the following pending results: None   Home Health: Patient/family declined home health.  Ambulatory referral ordered. Equipment/Devices:  Patient has appropriate DME     Relevant past medical, surgical, family and social history reviewed and updated as indicated. Interim medical history since our last visit reviewed. Allergies and medications reviewed and updated. Outpatient  Medications Prior to Visit  Medication Sig Dispense Refill   aspirin  EC 81 MG tablet Take 1 tablet (81 mg total) by mouth daily. Swallow whole. 90 tablet 0   CRANBERRY PO Take 1 tablet by mouth 2 (two) times daily.     ENULOSE  10 GM/15ML SOLN Take 45 mLs by mouth See admin instructions. 45 ml by mouth three to four times a day depending on daily plans     ferrous sulfate  325 (65 FE) MG tablet Take 1 tablet (325 mg total) by mouth 2 (two) times daily with a meal. 180 tablet 0   hydrocortisone  (ANUSOL -HC) 2.5 % rectal cream Place 1 Application rectally 2 (two) times daily. 30 g 0   levOCARNitine  (CARNITOR ) 330 MG tablet Take 330 mg by mouth 3 (three) times daily.     liver oil-zinc  oxide (DESITIN) 40 % ointment Apply topically daily as needed for irritation. 56.7 g 0   midodrine  (PROAMATINE ) 10 MG tablet Take 1 tablet (10 mg total) by mouth 3 (three) times daily with meals. 90 tablet 0   Multiple Vitamin (MULTI-VITAMIN) tablet Take 1 tablet by mouth in the morning.     pantoprazole  (PROTONIX ) 40 MG tablet Take 1 tablet (40 mg total) by mouth daily. 90 tablet 3   polyethylene glycol (MIRALAX  / GLYCOLAX ) 17 g packet Take 17 g by mouth daily. (Patient taking differently: Take 17 g by mouth daily as needed for mild constipation, moderate constipation or severe constipation.) 14 each 0   Probiotic Product (PROBIOTIC PEARLS WOMENS) CAPS Take 1 capsule by mouth in the morning.     rifaximin  (XIFAXAN ) 550 MG TABS tablet Take 1 tablet (550 mg total) by mouth 2 (two) times daily. 60 tablet 0   SYNTHROID  50 MCG tablet Take 1 tablet (50 mcg total) by mouth daily before breakfast. 90 tablet 0   furosemide  (LASIX ) 20 MG tablet Take 20-40 mg by mouth daily. Depending on blood pressure (Patient not taking: Reported on 12/11/2024)     magnesium  oxide (MAG-OX) 400 MG tablet Take 1 tablet (400 mg total) by mouth daily. (Patient not taking: Reported on 12/11/2024) 30 tablet 0   spironolactone  (ALDACTONE ) 25 MG tablet Take  50 mg by mouth daily. (Patient not taking: Reported on 12/11/2024)     valACYclovir  (VALTREX ) 1000 MG tablet Take 1 tablet (1,000 mg total) by mouth 3 (three) times daily. (Patient not taking: Reported on 12/11/2024) 21 tablet 0   No facility-administered medications prior to visit.     Per HPI unless specifically indicated in ROS section below Review of Systems  Objective:  BP 108/62 (BP Location: Right Arm, Patient Position: Sitting, Cuff Size: Normal)   Pulse (!) 55   Temp (!) 96.5 F (35.8 C) (Oral)   Ht 5' 4 (1.626 m)   Wt 141 lb (64 kg)   SpO2 96%   BMI 24.20 kg/m   Wt Readings from Last 3 Encounters:  12/11/24 141 lb (64 kg)  11/28/24 141 lb 12.1 oz (64.3 kg)  11/01/24 152 lb (68.9 kg)      Physical Exam Vitals and nursing note reviewed.  Constitutional:      Appearance: She is ill-appearing.     Comments:  In wheelchair today, able to stand with assistance for limited amt of time  HENT:     Head: Normocephalic and atraumatic.     Mouth/Throat:     Mouth: Mucous membranes are moist.     Pharynx: Oropharynx is clear. No oropharyngeal exudate or posterior oropharyngeal erythema.  Eyes:     Extraocular Movements: Extraocular movements intact.     Pupils: Pupils are equal, round, and reactive to light.  Cardiovascular:     Rate and Rhythm: Normal rate and regular rhythm.     Pulses: Normal pulses.     Heart sounds: No murmur heard. Pulmonary:     Effort: Pulmonary effort is normal. No respiratory distress.     Breath sounds: No wheezing, rhonchi or rales.     Comments: Few crackles bibasilarly Abdominal:     General: There is no distension.     Palpations: Abdomen is soft. There is no mass.     Tenderness: There is no abdominal tenderness. There is no guarding or rebound.     Hernia: No hernia is present.  Musculoskeletal:        General: Swelling and tenderness present.     Comments:  Compression stockings in place Tight R>L LE swelling Tender to palpation  lateral right lower leg at area of bruising No bony point tenderness to lower leg or foot  Diminished pedal pulses   Skin:    General: Skin is warm and dry.     Findings: Bruising present. No rash.     Comments:  Significant bruising to BUE as well as to RLE at site of injury  Mild erythema to anterior lower legs - chronic   Neurological:     Mental Status: She is alert.  Psychiatric:        Mood and Affect: Mood normal.        Behavior: Behavior normal.       Lab Results  Component Value Date   NA 140 12/07/2024   CL 115 (H) 12/07/2024   K 3.9 12/07/2024   CO2 20 (L) 12/07/2024   BUN 8 12/07/2024   CREATININE 1.08 (H) 12/07/2024   GFRNONAA 54 (L) 12/07/2024   CALCIUM  8.8 (L) 12/07/2024   PHOS 3.1 04/05/2024   ALBUMIN  2.4 (L) 12/07/2024   GLUCOSE 78 12/07/2024    Lab Results  Component Value Date   ALT 44 12/07/2024   AST 78 (H) 12/07/2024   ALKPHOS 203 (H) 12/07/2024   BILITOT 0.8 12/07/2024    Lab Results  Component Value Date   WBC 3.6 (L) 12/07/2024   HGB 9.0 (L) 12/07/2024   HCT 26.9 (L) 12/07/2024   MCV 105.9 (H) 12/07/2024   PLT 135 (L) 12/07/2024   Lab Results  Component Value Date   TSH 1.330 11/28/2024    Lab Results  Component Value Date   VITAMINB12 972 (H) 05/25/2024    Assessment & Plan:   Problem List Items Addressed This Visit     Cirrhosis (HCC)   Chronic cirrhosis with complications.  She will continue lactulose  45mL QID, titrating to effect (goal regular BM) and xifaxin.  She continues seeing Dawn Drazek at liver clinic.  Husband somewhat frustrated with [facility] care to date. Discussed palliative care - they will consider.  Will refer to [facility] PT as I'm unsure if she is strong enough to participate in outpatient PT at this time, hope is eventual transition back to outpatient PT.       Relevant Orders   CBC with Differential/Platelet   Comprehensive metabolic panel with GFR   Magnesium   Ambulatory referral to Home Health   Hepatic  encephalopathy (HCC) - Primary   Recent hospitalization due to this with elevated ammonia levels.  Husband notes poor PO intake.  Will continue lactulose  45mL QID , titrating to effect - goal regular bowel movements.       Relevant Orders   Ambulatory referral to Home Health   History of hypertension   Now opposite problem - see above.       Anemia of chronic disease   Worsened recently.  Update labs today.       Relevant Orders   Ambulatory referral to Home Health   General weakness   Refer back to HHPT       Relevant Orders   Ambulatory referral to Home Health   Protein-calorie malnutrition, severe   Low protein levels on latest check - update today  Encouraged continued efforts towards PO intake.       Relevant Orders   Ambulatory referral to Home Health   Altered mental status   AMS due to hepatic encephalopathy - this has cleared.       Lower extremity edema   Chronic, worsening now of diuretic (held for hypotension), and with hypoalbuminemia contributing.  Will restart slowly - spironolactone  25mg  daily with option to increase to 50mg  daily and add furosemide  pending BPs.      Relevant Orders   Ambulatory referral to Home Health   Thrombocytopenia   Cirrhosis- related.  She is also on aspirin  81mg  daily for secondary stroke prevention (h/o same).       Relevant Orders   Ambulatory referral to Home Health   Right leg injury, initial encounter   Story/exam most consistent with soft tissue injury sustained upon discharge from hospital. No bony tenderness, doubt fracture.  Supportive measures reviewed - start spironolactone  as per above.       Relevant Orders   Ambulatory referral to Home Health   Hypotension   New - started on midodrine  10mg  TID with meals.  Monitor for supine hypertension.  BP seems stable now - and with worsening pedal edema, did suggest slowly restarting diuretic. Start with spironolactone  25mg  daily with option to titrate to 50mg ,  add furosemide  pending effect on blood pressures. Discussed difficult situation with BP control vs edema control.       Relevant Orders   Ambulatory referral to Home Health     No orders of the defined types were placed in this encounter.   Orders Placed This Encounter  Procedures   CBC with Differential/Platelet   Comprehensive metabolic panel with GFR   Magnesium    Ambulatory referral to Home Health    Referral Priority:   Routine    Referral Type:   Home Health Care    Referral Reason:   Specialty Services Required    Requested Specialty:   Home Health Services    Number of Visits Requested:   1    Patient Instructions  Labs today  Continue lactulose  45mL 4 times a day. Continue Rifaximin .  Continue midodrine  10mg  three times a day.  Retry spironolactone  25mg  daily, with option to increase to 50mg  if blood pressures tolerate, same with furosemide .  Continue to encourage drinking water.  I will order home health physical therapy.   Follow up plan: No follow-ups on file.  Anton Blas, MD   "

## 2024-12-11 NOTE — Patient Instructions (Addendum)
 Labs today  Continue lactulose  45mL 4 times a day. Continue Rifaximin .  Continue midodrine  10mg  three times a day.  Retry spironolactone  25mg  daily, with option to increase to 50mg  if blood pressures tolerate, same with furosemide .  Continue to encourage drinking water.  I will order home health physical therapy.

## 2024-12-12 DIAGNOSIS — I959 Hypotension, unspecified: Secondary | ICD-10-CM | POA: Insufficient documentation

## 2024-12-12 DIAGNOSIS — S8991XA Unspecified injury of right lower leg, initial encounter: Secondary | ICD-10-CM | POA: Insufficient documentation

## 2024-12-12 LAB — COMPREHENSIVE METABOLIC PANEL WITH GFR
ALT: 54 U/L — ABNORMAL HIGH (ref 3–35)
AST: 81 U/L — ABNORMAL HIGH (ref 5–37)
Albumin: 2.7 g/dL — ABNORMAL LOW (ref 3.5–5.2)
Alkaline Phosphatase: 220 U/L — ABNORMAL HIGH (ref 39–117)
BUN: 11 mg/dL (ref 6–23)
CO2: 23 meq/L (ref 19–32)
Calcium: 9.1 mg/dL (ref 8.4–10.5)
Chloride: 110 meq/L (ref 96–112)
Creatinine, Ser: 1.04 mg/dL (ref 0.40–1.20)
GFR: 53.31 mL/min — ABNORMAL LOW
Glucose, Bld: 72 mg/dL (ref 70–99)
Potassium: 4.3 meq/L (ref 3.5–5.1)
Sodium: 138 meq/L (ref 135–145)
Total Bilirubin: 1.2 mg/dL (ref 0.2–1.2)
Total Protein: 5.6 g/dL — ABNORMAL LOW (ref 6.0–8.3)

## 2024-12-12 LAB — CBC WITH DIFFERENTIAL/PLATELET
Basophils Absolute: 0 K/uL (ref 0.0–0.1)
Basophils Relative: 0.4 % (ref 0.0–3.0)
Eosinophils Absolute: 0.1 K/uL (ref 0.0–0.7)
Eosinophils Relative: 3.3 % (ref 0.0–5.0)
HCT: 27.8 % — ABNORMAL LOW (ref 36.0–46.0)
Hemoglobin: 9.6 g/dL — ABNORMAL LOW (ref 12.0–15.0)
Lymphocytes Relative: 17.9 % (ref 12.0–46.0)
Lymphs Abs: 0.7 K/uL (ref 0.7–4.0)
MCHC: 34.6 g/dL (ref 30.0–36.0)
MCV: 104 fl — ABNORMAL HIGH (ref 78.0–100.0)
Monocytes Absolute: 0.3 K/uL (ref 0.1–1.0)
Monocytes Relative: 9.1 % (ref 3.0–12.0)
Neutro Abs: 2.5 K/uL (ref 1.4–7.7)
Neutrophils Relative %: 69.3 % (ref 43.0–77.0)
Platelets: 176 K/uL (ref 150.0–400.0)
RBC: 2.67 Mil/uL — ABNORMAL LOW (ref 3.87–5.11)
RDW: 20.3 % — ABNORMAL HIGH (ref 11.5–15.5)
WBC: 3.6 K/uL — ABNORMAL LOW (ref 4.0–10.5)

## 2024-12-12 LAB — MAGNESIUM: Magnesium: 1.4 mg/dL — ABNORMAL LOW (ref 1.5–2.5)

## 2024-12-12 NOTE — Assessment & Plan Note (Signed)
 Chronic cirrhosis with complications.  She will continue lactulose  45mL QID, titrating to effect (goal regular BM) and xifaxin.  She continues seeing Dawn Drazek at liver clinic.  Husband somewhat frustrated with [facility] care to date. Discussed palliative care - they will consider.  Will refer to [facility] PT as I'm unsure if she is strong enough to participate in outpatient PT at this time, hope is eventual transition back to outpatient PT.

## 2024-12-12 NOTE — Assessment & Plan Note (Addendum)
 Cirrhosis- related.  She is also on aspirin  81mg  daily for secondary stroke prevention (h/o same).

## 2024-12-12 NOTE — Assessment & Plan Note (Signed)
 Now opposite problem - see above.

## 2024-12-12 NOTE — Assessment & Plan Note (Signed)
 Refer back to HHPT

## 2024-12-12 NOTE — Assessment & Plan Note (Signed)
 AMS due to hepatic encephalopathy - this has cleared.

## 2024-12-12 NOTE — Assessment & Plan Note (Signed)
 Recent hospitalization due to this with elevated ammonia levels.  Husband notes poor PO intake.  Will continue lactulose  45mL QID , titrating to effect - goal regular bowel movements.

## 2024-12-12 NOTE — Assessment & Plan Note (Addendum)
 Low protein levels on latest check - update today  Encouraged continued efforts towards PO intake.

## 2024-12-12 NOTE — Assessment & Plan Note (Addendum)
 Chronic, worsening now of diuretic (held for hypotension), and with hypoalbuminemia contributing.  Will restart slowly - spironolactone  25mg  daily with option to increase to 50mg  daily and add furosemide  pending BPs.

## 2024-12-12 NOTE — Assessment & Plan Note (Signed)
 Story/exam most consistent with soft tissue injury sustained upon discharge from hospital. No bony tenderness, doubt fracture.  Supportive measures reviewed - start spironolactone  as per above.

## 2024-12-12 NOTE — Assessment & Plan Note (Signed)
 Worsened recently.  Update labs today.

## 2024-12-12 NOTE — Assessment & Plan Note (Signed)
 New - started on midodrine  10mg  TID with meals.  Monitor for supine hypertension.  BP seems stable now - and with worsening pedal edema, did suggest slowly restarting diuretic. Start with spironolactone  25mg  daily with option to titrate to 50mg , add furosemide  pending effect on blood pressures. Discussed difficult situation with BP control vs edema control.

## 2024-12-15 ENCOUNTER — Ambulatory Visit: Payer: Self-pay | Admitting: Family Medicine

## 2025-01-10 ENCOUNTER — Ambulatory Visit: Attending: Nurse Practitioner | Admitting: Physical Therapy

## 2025-01-31 ENCOUNTER — Ambulatory Visit: Admitting: Nurse Practitioner

## 2025-03-28 ENCOUNTER — Ambulatory Visit
